# Patient Record
Sex: Male | Born: 1950 | Race: Black or African American | Hispanic: No | Marital: Married | Smoking: Never smoker
Health system: Southern US, Community
[De-identification: ages and names within clinical notes are randomized; demographics above are authoritative.]

## PROBLEM LIST (undated history)

## (undated) DIAGNOSIS — N390 Urinary tract infection, site not specified: Secondary | ICD-10-CM

## (undated) DIAGNOSIS — I10 Essential (primary) hypertension: Secondary | ICD-10-CM

## (undated) DIAGNOSIS — D469 Myelodysplastic syndrome, unspecified: Secondary | ICD-10-CM

## (undated) DIAGNOSIS — C801 Malignant (primary) neoplasm, unspecified: Secondary | ICD-10-CM

## (undated) DIAGNOSIS — Z86711 Personal history of pulmonary embolism: Secondary | ICD-10-CM

## (undated) DIAGNOSIS — I480 Paroxysmal atrial fibrillation: Secondary | ICD-10-CM

## (undated) HISTORY — PX: NO PAST SURGERIES: SHX2092

---

## 2019-07-06 ENCOUNTER — Other Ambulatory Visit: Payer: Self-pay | Admitting: Urology

## 2019-07-06 ENCOUNTER — Other Ambulatory Visit: Payer: Self-pay | Admitting: Physical Therapy

## 2019-07-06 ENCOUNTER — Other Ambulatory Visit (HOSPITAL_COMMUNITY): Payer: Self-pay | Admitting: Urology

## 2019-07-06 DIAGNOSIS — C61 Malignant neoplasm of prostate: Secondary | ICD-10-CM

## 2019-07-23 ENCOUNTER — Ambulatory Visit (HOSPITAL_COMMUNITY)
Admission: RE | Admit: 2019-07-23 | Discharge: 2019-07-23 | Disposition: A | Discharge status: Home / Self Care | Payer: 59 | Source: Ambulatory Visit | Attending: Urology | Admitting: Urology

## 2019-07-23 ENCOUNTER — Other Ambulatory Visit: Payer: Self-pay

## 2019-07-23 ENCOUNTER — Encounter (HOSPITAL_COMMUNITY)
Admission: RE | Admit: 2019-07-23 | Discharge: 2019-07-23 | Disposition: A | Discharge status: Home / Self Care | Payer: 59 | Source: Ambulatory Visit | Attending: Urology | Admitting: Urology

## 2019-07-23 DIAGNOSIS — C61 Malignant neoplasm of prostate: Secondary | ICD-10-CM | POA: Diagnosis not present

## 2019-08-11 ENCOUNTER — Other Ambulatory Visit: Payer: Self-pay | Admitting: Urology

## 2019-08-26 NOTE — Patient Instructions (Addendum)
DUE TO COVID-19 ONLY ONE VISITOR IS ALLOWED TO COME WITH YOU AND STAY IN THE WAITING ROOM ONLY DURING PRE OP AND PROCEDURE DAY OF SURGERY. THE 1 VISITOR MAY VISIT WITH YOU AFTER SURGERY IN YOUR PRIVATE ROOM DURING VISITING HOURS ONLY!  YOU NEED TO HAVE A COVID 19 TEST ON: 08/28/19  @  11:00 am , THIS TEST MUST BE DONE BEFORE SURGERY, COME  [address], [place] , [id number].  ([place]) ONCE YOUR COVID TEST IS COMPLETED, PLEASE BEGIN THE QUARANTINE INSTRUCTIONS AS OUTLINED IN YOUR HANDOUT.                Tyler [name]    Your procedure is scheduled on: 09/01/19   Report to [facility] Main  Entrance   Report to admitting at: 10:00 AM     Call this number if you have problems the morning of surgery [phone]    Remember: Do not eat solid food :After Midnight. Clear liquids from midnight until 9:00 am. Make sure you drink plenty liquids the day of the prep.  CLEAR LIQUID DIET   Foods Allowed                                                                     Foods Excluded  Coffee and tea, regular and decaf                             liquids that you cannot  Plain Jell-O any favor except red or purple                                           see through such as: Fruit ices (not with fruit pulp)                                     milk, soups, orange juice  Iced Popsicles                                    All solid food Carbonated beverages, regular and diet                                    Cranberry, grape and apple juices Sports drinks like Gatorade Lightly seasoned clear broth or consume(fat free) Sugar, honey syrup  Sample Menu Breakfast                                Lunch                                     Supper Cranberry juice                    Beef  broth                            Chicken broth Jell-O                                     Grape juice                           Apple juice Coffee or tea                        Jell-O                                       Popsicle                                                Coffee or tea                        Coffee or tea  _____________________________________________________________________  BRUSH YOUR TEETH MORNING OF SURGERY AND RINSE YOUR MOUTH OUT, NO CHEWING GUM CANDY OR MINTS.     Take these medicines the morning of surgery with A SIP OF WATER: tamsulosin                               You may not have any metal on your body including hair pins and              piercings  Do not wear jewelry, lotions, powders or perfumes, deodorant             Men may shave face and neck.   Do not bring valuables to the hospital. [place].  Contacts, dentures or bridgework may not be worn into surgery.  Leave suitcase in the car. After surgery it may be brought to your room.     Patients discharged the day of surgery will not be allowed to drive home. IF YOU ARE HAVING SURGERY AND GOING HOME THE SAME DAY, YOU MUST HAVE AN ADULT TO DRIVE YOU HOME AND BE WITH YOU FOR 24 HOURS. YOU MAY GO HOME BY TAXI OR UBER OR ORTHERWISE, BUT AN ADULT MUST ACCOMPANY YOU HOME AND STAY WITH YOU FOR 24 HOURS.  Name and phone number of your driver:  Special Instructions: N/A              Please read over the following fact sheets you were given: _____________________________________________________________________  [facility] - Preparing for Surgery Before surgery, you can play an important role.  Because skin is not sterile, your skin needs to be as free of germs as possible.  You can reduce the number of germs on your skin by washing with CHG (chlorahexidine gluconate) soap before surgery.  CHG is an antiseptic cleaner which kills germs and bonds with the skin to continue killing germs even after washing. Please DO NOT use if you  have an allergy to CHG or antibacterial soaps.  If your skin becomes reddened/irritated stop using the CHG and inform your  nurse when you arrive at Short Stay. Do not shave (including legs and underarms) for at least 48 hours prior to the first CHG shower.  You may shave your face/neck. Please follow these instructions carefully:  1.  Shower with CHG Soap the night before surgery and the  morning of Surgery.  2.  If you choose to wash your hair, wash your hair first as usual with your  normal  shampoo.  3.  After you shampoo, rinse your hair and body thoroughly to remove the  shampoo.                           4.  Use CHG as you would any other liquid soap.  You can apply chg directly  to the skin and wash                       Gently with a scrungie or clean washcloth.  5.  Apply the CHG Soap to your body ONLY FROM THE NECK DOWN.   Do not use on face/ open                           Wound or open sores. Avoid contact with eyes, ears mouth and genitals (private parts).                       Wash face,  Genitals (private parts) with your normal soap.             6.  Wash thoroughly, paying special attention to the area where your surgery  will be performed.  7.  Thoroughly rinse your body with warm water from the neck down.  8.  DO NOT shower/wash with your normal soap after using and rinsing off  the CHG Soap.                9.  Pat yourself dry with a clean towel.            10.  Wear clean pajamas.            11.  Place clean sheets on your bed the night of your first shower and do not  sleep with pets. Day of Surgery : Do not apply any lotions/deodorants the morning of surgery.  Please wear clean clothes to the hospital/surgery center.  FAILURE TO FOLLOW THESE INSTRUCTIONS MAY RESULT IN THE CANCELLATION OF YOUR SURGERY PATIENT SIGNATURE_________________________________  NURSE SIGNATURE__________________________________  ________________________________________________________________________

## 2019-08-27 ENCOUNTER — Encounter (HOSPITAL_COMMUNITY)
Admission: RE | Admit: 2019-08-27 | Discharge: 2019-08-27 | Disposition: A | Discharge status: Home / Self Care | Payer: 59 | Source: Ambulatory Visit | Attending: Urology | Admitting: Urology

## 2019-08-27 ENCOUNTER — Encounter (HOSPITAL_COMMUNITY): Payer: Self-pay

## 2019-08-27 ENCOUNTER — Other Ambulatory Visit: Payer: Self-pay

## 2019-08-27 DIAGNOSIS — Z01818 Encounter for other preprocedural examination: Secondary | ICD-10-CM | POA: Insufficient documentation

## 2019-08-27 HISTORY — DX: Essential (primary) hypertension: I10

## 2019-08-27 HISTORY — DX: Malignant (primary) neoplasm, unspecified: C80.1

## 2019-08-27 LAB — CBC
HCT: 43.8 % (ref 39.0–52.0)
Hemoglobin: 13.9 g/dL (ref 13.0–17.0)
MCH: 27.4 pg (ref 26.0–34.0)
MCHC: 31.7 g/dL (ref 30.0–36.0)
MCV: 86.2 fL (ref 80.0–100.0)
Platelets: 264 10*3/uL (ref 150–400)
RBC: 5.08 MIL/uL (ref 4.22–5.81)
RDW: 13.8 % (ref 11.5–15.5)
WBC: 6.2 10*3/uL (ref 4.0–10.5)
nRBC: 0 % (ref 0.0–0.2)

## 2019-08-27 LAB — BASIC METABOLIC PANEL
Anion gap: 10 (ref 5–15)
BUN: 12 mg/dL (ref 8–23)
CO2: 23 mmol/L (ref 22–32)
Calcium: 8.4 mg/dL — ABNORMAL LOW (ref 8.9–10.3)
Chloride: 105 mmol/L (ref 98–111)
Creatinine, Ser: 0.99 mg/dL (ref 0.61–1.24)
GFR calc Af Amer: >60 mL/min (ref >60)
GFR calc non Af Amer: >60 mL/min (ref >60)
Glucose, Bld: 114 mg/dL — ABNORMAL HIGH (ref 70–99)
Potassium: 3.6 mmol/L (ref 3.5–5.1)
Sodium: 138 mmol/L (ref 135–145)

## 2019-08-27 NOTE — Progress Notes (Signed)
COVID Vaccine Completed: Date COVID Vaccine completed: COVID vaccine manufacturer: Pfizer    [employer]   PCP - Dr. Glean Salvo Cardiologist - No  Chest x-ray -  EKG - 02/25/19. CEW Stress Test -  ECHO -  Cardiac Cath -   Sleep Study -  CPAP -   Fasting Blood Sugar -  Checks Blood Sugar _____ times a day  Blood Thinner Instructions: Aspirin Instructions: Last Dose:  Anesthesia review:   Patient denies shortness of breath, fever, cough and chest pain at PAT appointment   Patient verbalized understanding of instructions that were given to them at the PAT appointment. Patient was also instructed that they will need to review over the PAT instructions again at home before surgery.

## 2019-08-28 ENCOUNTER — Other Ambulatory Visit (HOSPITAL_COMMUNITY)
Admission: RE | Admit: 2019-08-28 | Discharge: 2019-08-28 | Disposition: A | Discharge status: Home / Self Care | Payer: 59 | Source: Ambulatory Visit | Attending: Urology | Admitting: Urology

## 2019-08-28 DIAGNOSIS — Z01812 Encounter for preprocedural laboratory examination: Secondary | ICD-10-CM | POA: Diagnosis present

## 2019-08-28 DIAGNOSIS — Z20822 Contact with and (suspected) exposure to covid-19: Secondary | ICD-10-CM | POA: Diagnosis not present

## 2019-08-28 LAB — SARS CORONAVIRUS 2 (TAT 6-24 HRS): SARS Coronavirus 2: NEGATIVE

## 2019-08-31 NOTE — Anesthesia Preprocedure Evaluation (Addendum)
Anesthesia Evaluation  Patient identified by MRN, date of birth, ID band Patient awake    Reviewed: Allergy & Precautions, NPO status , Patient's Chart, lab work & pertinent test results  Airway Mallampati: II  TM Distance: >3 FB Neck ROM: Full    Dental no notable dental hx. (+) Teeth Intact, Dental Advisory Given   Pulmonary neg pulmonary ROS,    Pulmonary exam normal breath sounds clear to auscultation       Cardiovascular hypertension, Pt. on medications Normal cardiovascular exam Rhythm:Regular Rate:Normal     Neuro/Psych negative neurological ROS  negative psych ROS   GI/Hepatic negative GI ROS, Neg liver ROS,   Endo/Other  negative endocrine ROS  Renal/GU negative Renal ROS     Musculoskeletal negative musculoskeletal ROS (+)   Abdominal   Peds  Hematology Lab Results      Component                Value               Date                      WBC                      6.2                 08/27/2019                HGB                      13.9                08/27/2019                HCT                      43.8                08/27/2019                MCV                      86.2                08/27/2019                PLT                      264                 08/27/2019              Anesthesia Other Findings   Reproductive/Obstetrics                            Anesthesia Physical Anesthesia Plan  ASA: II  Anesthesia Plan: General   Post-op Pain Management:    Induction: Intravenous  PONV Risk Score and Plan: 3 and Treatment may vary due to age or medical condition, Ondansetron and Dexamethasone  Airway Management Planned: Oral ETT  Additional Equipment: None  Intra-op Plan:   Post-operative Plan: Extubation in OR  Informed Consent: I have reviewed the patients History and Physical, chart, labs and discussed the procedure including the risks, benefits and  alternatives for the proposed anesthesia with the patient or authorized representative who  has indicated his/her understanding and acceptance.     Dental advisory given  Plan Discussed with:   Anesthesia Plan Comments:        Anesthesia Quick Evaluation

## 2019-09-01 ENCOUNTER — Ambulatory Visit (HOSPITAL_COMMUNITY): Payer: 59 | Admitting: Anesthesiology

## 2019-09-01 ENCOUNTER — Inpatient Hospital Stay (HOSPITAL_COMMUNITY)
Admission: AD | Admit: 2019-09-01 | Discharge: 2019-09-02 | DRG: 708 | Disposition: A | Discharge status: Home / Self Care | Payer: 59 | Attending: Urology | Admitting: Urology

## 2019-09-01 ENCOUNTER — Other Ambulatory Visit: Payer: Self-pay

## 2019-09-01 ENCOUNTER — Encounter (HOSPITAL_COMMUNITY)
Admission: AD | Disposition: A | Discharge status: Home / Self Care | Payer: Self-pay | Source: Home / Self Care | Attending: Urology

## 2019-09-01 ENCOUNTER — Encounter (HOSPITAL_COMMUNITY): Payer: Self-pay | Admitting: Urology

## 2019-09-01 DIAGNOSIS — Z888 Allergy status to other drugs, medicaments and biological substances status: Secondary | ICD-10-CM

## 2019-09-01 DIAGNOSIS — I10 Essential (primary) hypertension: Secondary | ICD-10-CM | POA: Diagnosis present

## 2019-09-01 DIAGNOSIS — N139 Obstructive and reflux uropathy, unspecified: Secondary | ICD-10-CM | POA: Diagnosis present

## 2019-09-01 DIAGNOSIS — C61 Malignant neoplasm of prostate: Secondary | ICD-10-CM | POA: Diagnosis present

## 2019-09-01 DIAGNOSIS — E882 Lipomatosis, not elsewhere classified: Secondary | ICD-10-CM | POA: Diagnosis present

## 2019-09-01 HISTORY — PX: LYMPHADENECTOMY: SHX5960

## 2019-09-01 HISTORY — PX: ROBOT ASSISTED LAPAROSCOPIC RADICAL PROSTATECTOMY: SHX5141

## 2019-09-01 LAB — TYPE AND SCREEN
ABO/RH(D): O POS
Antibody Screen: NEGATIVE

## 2019-09-01 LAB — ABO/RH: ABO/RH(D): O POS

## 2019-09-01 LAB — HEMOGLOBIN AND HEMATOCRIT, BLOOD
HCT: 43.9 % (ref 39.0–52.0)
Hemoglobin: 13.7 g/dL (ref 13.0–17.0)

## 2019-09-01 SURGERY — PROSTATECTOMY, RADICAL, ROBOT-ASSISTED, LAPAROSCOPIC
Anesthesia: General

## 2019-09-01 MED ORDER — SODIUM CHLORIDE 0.9 % IV BOLUS
1000.0000 mL | Freq: Once | INTRAVENOUS | Status: AC
  Administered 2019-09-01: 1000 mL via INTRAVENOUS

## 2019-09-01 MED ORDER — ONDANSETRON HCL 4 MG/2ML IJ SOLN
INTRAMUSCULAR | Status: DC | PRN
  Administered 2019-09-01: 4 mg via INTRAVENOUS

## 2019-09-01 MED ORDER — BELLADONNA ALKALOIDS-OPIUM 16.2-60 MG RE SUPP: 1.0000 | Freq: Four times a day (QID) | RECTAL | Status: DC | PRN

## 2019-09-01 MED ORDER — SUGAMMADEX SODIUM 200 MG/2ML IV SOLN
INTRAVENOUS | Status: DC | PRN
  Administered 2019-09-01: 200 mg via INTRAVENOUS

## 2019-09-01 MED ORDER — DOCUSATE SODIUM 100 MG PO CAPS
100.0000 mg | ORAL_CAPSULE | Freq: Two times a day (BID) | ORAL | Status: DC
  Administered 2019-09-01 – 2019-09-02 (×2): 100 mg via ORAL
  Filled 2019-09-01 (×2): qty 1

## 2019-09-01 MED ORDER — GLYCOPYRROLATE PF 0.2 MG/ML IJ SOSY
PREFILLED_SYRINGE | INTRAMUSCULAR | Status: DC | PRN
Start: 2019-09-01 — End: 2019-09-01
  Administered 2019-09-01: .2 mg via INTRAVENOUS

## 2019-09-01 MED ORDER — MIDAZOLAM HCL 2 MG/2ML IJ SOLN
INTRAMUSCULAR | Status: AC
  Filled 2019-09-01: qty 2

## 2019-09-01 MED ORDER — CHLORHEXIDINE GLUCONATE 0.12 % MT SOLN
15.0000 mL | Freq: Once | OROMUCOSAL | Status: AC
  Administered 2019-09-01: 15 mL via OROMUCOSAL

## 2019-09-01 MED ORDER — ACETAMINOPHEN 500 MG PO TABS
1000.0000 mg | ORAL_TABLET | Freq: Three times a day (TID) | ORAL | Status: DC
  Administered 2019-09-01 – 2019-09-02 (×3): 1000 mg via ORAL
  Filled 2019-09-01 (×3): qty 2

## 2019-09-01 MED ORDER — DEXAMETHASONE SODIUM PHOSPHATE 10 MG/ML IJ SOLN
INTRAMUSCULAR | Status: DC | PRN
  Administered 2019-09-01: 10 mg via INTRAVENOUS

## 2019-09-01 MED ORDER — PROPOFOL 10 MG/ML IV BOLUS
INTRAVENOUS | Status: DC | PRN
  Administered 2019-09-01: 130 mg via INTRAVENOUS

## 2019-09-01 MED ORDER — KCL IN DEXTROSE-NACL 20-5-0.45 MEQ/L-%-% IV SOLN
INTRAVENOUS | Status: DC
  Filled 2019-09-01 (×2): qty 1000

## 2019-09-01 MED ORDER — STERILE WATER FOR IRRIGATION IR SOLN
Status: DC | PRN
  Administered 2019-09-01: 1000 mL

## 2019-09-01 MED ORDER — DEXAMETHASONE SODIUM PHOSPHATE 10 MG/ML IJ SOLN
INTRAMUSCULAR | Status: AC
  Filled 2019-09-01: qty 1

## 2019-09-01 MED ORDER — SODIUM CHLORIDE (PF) 0.9 % IJ SOLN
INTRAMUSCULAR | Status: DC | PRN
  Administered 2019-09-01: 20 mL

## 2019-09-01 MED ORDER — CEFAZOLIN SODIUM-DEXTROSE 2-4 GM/100ML-% IV SOLN
2.0000 g | INTRAVENOUS | Status: AC
  Administered 2019-09-01: 2 g via INTRAVENOUS
  Filled 2019-09-01: qty 100

## 2019-09-01 MED ORDER — LACTATED RINGERS IR SOLN
Status: DC | PRN
  Administered 2019-09-01: 1000 mL

## 2019-09-01 MED ORDER — FENTANYL CITRATE (PF) 250 MCG/5ML IJ SOLN
INTRAMUSCULAR | Status: AC
  Filled 2019-09-01: qty 5

## 2019-09-01 MED ORDER — EPHEDRINE SULFATE-NACL 50-0.9 MG/10ML-% IV SOSY
PREFILLED_SYRINGE | INTRAVENOUS | Status: DC | PRN
  Administered 2019-09-01: 5 mg via INTRAVENOUS

## 2019-09-01 MED ORDER — OXYCODONE HCL 5 MG PO TABS
5.0000 mg | ORAL_TABLET | ORAL | Status: DC | PRN
  Administered 2019-09-01 – 2019-09-02 (×2): 5 mg via ORAL
  Filled 2019-09-01 (×3): qty 1

## 2019-09-01 MED ORDER — SODIUM CHLORIDE (PF) 0.9 % IJ SOLN
INTRAMUSCULAR | Status: AC
  Filled 2019-09-01: qty 20

## 2019-09-01 MED ORDER — ONDANSETRON HCL 4 MG/2ML IJ SOLN
INTRAMUSCULAR | Status: AC
  Filled 2019-09-01: qty 2

## 2019-09-01 MED ORDER — MAGNESIUM CITRATE PO SOLN: 1.0000 | Freq: Once | ORAL | Status: DC

## 2019-09-01 MED ORDER — MIDAZOLAM HCL 5 MG/5ML IJ SOLN
INTRAMUSCULAR | Status: DC | PRN
  Administered 2019-09-01: 2 mg via INTRAVENOUS

## 2019-09-01 MED ORDER — ORAL CARE MOUTH RINSE: 15.0000 mL | Freq: Once | OROMUCOSAL | Status: AC

## 2019-09-01 MED ORDER — ONDANSETRON HCL 4 MG/2ML IJ SOLN: 4.0000 mg | Freq: Once | INTRAMUSCULAR | Status: DC | PRN

## 2019-09-01 MED ORDER — FENTANYL CITRATE (PF) 100 MCG/2ML IJ SOLN
25.0000 ug | INTRAMUSCULAR | Status: DC | PRN
  Administered 2019-09-01: 50 ug via INTRAVENOUS

## 2019-09-01 MED ORDER — LACTATED RINGERS IV SOLN: INTRAVENOUS | Status: DC

## 2019-09-01 MED ORDER — DIPHENHYDRAMINE HCL 50 MG/ML IJ SOLN: 12.5000 mg | Freq: Four times a day (QID) | INTRAMUSCULAR | Status: DC | PRN

## 2019-09-01 MED ORDER — BACITRACIN-NEOMYCIN-POLYMYXIN 400-5-5000 EX OINT: 1.0000 "application " | TOPICAL_OINTMENT | Freq: Three times a day (TID) | CUTANEOUS | Status: DC | PRN

## 2019-09-01 MED ORDER — FENTANYL CITRATE (PF) 100 MCG/2ML IJ SOLN
INTRAMUSCULAR | Status: DC | PRN
  Administered 2019-09-01: 75 ug via INTRAVENOUS
  Administered 2019-09-01 (×2): 50 ug via INTRAVENOUS
  Administered 2019-09-01: 25 ug via INTRAVENOUS
  Administered 2019-09-01: 50 ug via INTRAVENOUS
  Administered 2019-09-01: 25 ug via INTRAVENOUS
  Administered 2019-09-01: 50 ug via INTRAVENOUS

## 2019-09-01 MED ORDER — PROPOFOL 10 MG/ML IV BOLUS
INTRAVENOUS | Status: AC
  Filled 2019-09-01: qty 20

## 2019-09-01 MED ORDER — MORPHINE SULFATE (PF) 2 MG/ML IV SOLN
2.0000 mg | INTRAVENOUS | Status: DC | PRN
  Administered 2019-09-01: 2 mg via INTRAVENOUS
  Filled 2019-09-01: qty 1

## 2019-09-01 MED ORDER — FENTANYL CITRATE (PF) 100 MCG/2ML IJ SOLN
INTRAMUSCULAR | Status: AC
  Administered 2019-09-01: 50 ug via INTRAVENOUS
  Filled 2019-09-01: qty 2

## 2019-09-01 MED ORDER — DIPHENHYDRAMINE HCL 12.5 MG/5ML PO ELIX: 12.5000 mg | ORAL_SOLUTION | Freq: Four times a day (QID) | ORAL | Status: DC | PRN

## 2019-09-01 MED ORDER — ONDANSETRON HCL 4 MG/2ML IJ SOLN: 4.0000 mg | INTRAMUSCULAR | Status: DC | PRN

## 2019-09-01 MED ORDER — ACETAMINOPHEN 10 MG/ML IV SOLN: 1000.0000 mg | Freq: Once | INTRAVENOUS | Status: DC | PRN

## 2019-09-01 MED ORDER — ROCURONIUM BROMIDE 10 MG/ML (PF) SYRINGE
PREFILLED_SYRINGE | INTRAVENOUS | Status: AC
  Filled 2019-09-01: qty 10

## 2019-09-01 MED ORDER — LIDOCAINE 2% (20 MG/ML) 5 ML SYRINGE
INTRAMUSCULAR | Status: DC | PRN
  Administered 2019-09-01: 100 mg via INTRAVENOUS

## 2019-09-01 MED ORDER — BUPIVACAINE LIPOSOME 1.3 % IJ SUSP
20.0000 mL | Freq: Once | INTRAMUSCULAR | Status: AC
  Administered 2019-09-01: 20 mL
  Filled 2019-09-01: qty 20

## 2019-09-01 MED ORDER — FENTANYL CITRATE (PF) 100 MCG/2ML IJ SOLN
INTRAMUSCULAR | Status: AC
  Filled 2019-09-01: qty 2

## 2019-09-01 MED ORDER — ROCURONIUM BROMIDE 10 MG/ML (PF) SYRINGE
PREFILLED_SYRINGE | INTRAVENOUS | Status: DC | PRN
  Administered 2019-09-01: 20 mg via INTRAVENOUS
  Administered 2019-09-01: 60 mg via INTRAVENOUS

## 2019-09-01 MED ORDER — LIDOCAINE 2% (20 MG/ML) 5 ML SYRINGE
INTRAMUSCULAR | Status: AC
  Filled 2019-09-01: qty 5

## 2019-09-01 MED ORDER — CEFAZOLIN SODIUM-DEXTROSE 1-4 GM/50ML-% IV SOLN
1.0000 g | Freq: Three times a day (TID) | INTRAVENOUS | Status: AC
  Administered 2019-09-01 – 2019-09-02 (×2): 1 g via INTRAVENOUS
  Filled 2019-09-01 (×2): qty 50

## 2019-09-01 SURGICAL SUPPLY — 66 items
APPLICATOR COTTON TIP 6 STRL (MISCELLANEOUS) ×2 IMPLANT
APPLICATOR COTTON TIP 6IN STRL (MISCELLANEOUS) ×4
CATH FOLEY 2WAY SLVR 18FR 30CC (CATHETERS) ×4 IMPLANT
CATH TIEMANN FOLEY 18FR 5CC (CATHETERS) ×4 IMPLANT
CHLORAPREP W/TINT 26 (MISCELLANEOUS) ×4 IMPLANT
CLIP VESOLOCK LG 6/CT PURPLE (CLIP) ×8 IMPLANT
CNTNR URN SCR LID CUP LEK RST (MISCELLANEOUS) ×2 IMPLANT
CONT SPEC 4OZ STRL OR WHT (MISCELLANEOUS) ×2
COVER SURGICAL LIGHT HANDLE (MISCELLANEOUS) ×4 IMPLANT
COVER TIP SHEARS 8 DVNC (MISCELLANEOUS) ×2 IMPLANT
COVER TIP SHEARS 8MM DA VINCI (MISCELLANEOUS) ×2
COVER WAND RF STERILE (DRAPES) IMPLANT
CUTTER ECHEON FLEX ENDO 45 340 (ENDOMECHANICALS) ×4 IMPLANT
DECANTER SPIKE VIAL GLASS SM (MISCELLANEOUS) ×4 IMPLANT
DERMABOND ADVANCED (GAUZE/BANDAGES/DRESSINGS) ×2
DERMABOND ADVANCED .7 DNX12 (GAUZE/BANDAGES/DRESSINGS) ×2 IMPLANT
DRAIN CHANNEL RND F F (WOUND CARE) IMPLANT
DRAPE ARM DVNC X/XI (DISPOSABLE) ×8 IMPLANT
DRAPE COLUMN DVNC XI (DISPOSABLE) ×2 IMPLANT
DRAPE DA VINCI XI ARM (DISPOSABLE) ×8
DRAPE DA VINCI XI COLUMN (DISPOSABLE) ×2
DRAPE SURG IRRIG POUCH 19X23 (DRAPES) ×4 IMPLANT
DRSG TEGADERM 4X4.75 (GAUZE/BANDAGES/DRESSINGS) ×4 IMPLANT
ELECT REM PT RETURN 15FT ADLT (MISCELLANEOUS) ×4 IMPLANT
GAUZE 4X4 16PLY RFD (DISPOSABLE) IMPLANT
GAUZE SPONGE 2X2 8PLY STRL LF (GAUZE/BANDAGES/DRESSINGS) IMPLANT
GLOVE BIO SURGEON STRL SZ 6.5 (GLOVE) ×3 IMPLANT
GLOVE BIO SURGEONS STRL SZ 6.5 (GLOVE) ×1
GLOVE BIOGEL M STRL SZ7.5 (GLOVE) ×8 IMPLANT
GLOVE BIOGEL PI IND STRL 7.5 (GLOVE) ×2 IMPLANT
GLOVE BIOGEL PI INDICATOR 7.5 (GLOVE) ×2
GOWN STRL REUS W/TWL LRG LVL3 (GOWN DISPOSABLE) ×12 IMPLANT
HOLDER FOLEY CATH W/STRAP (MISCELLANEOUS) ×4 IMPLANT
IRRIG SUCT STRYKERFLOW 2 WTIP (MISCELLANEOUS) ×4
IRRIGATION SUCT STRKRFLW 2 WTP (MISCELLANEOUS) ×2 IMPLANT
IV LACTATED RINGERS 1000ML (IV SOLUTION) ×4 IMPLANT
KIT PROCEDURE DA VINCI SI (MISCELLANEOUS) ×2
KIT PROCEDURE DVNC SI (MISCELLANEOUS) ×2 IMPLANT
KIT TURNOVER KIT A (KITS) IMPLANT
NEEDLE INSUFFLATION 14GA 120MM (NEEDLE) ×4 IMPLANT
NEEDLE SPNL 22GX7 QUINCKE BK (NEEDLE) ×4 IMPLANT
PACK ROBOTIC CUSTOM UROLOGY (CUSTOM PROCEDURE TRAY) ×4 IMPLANT
PAD POSITIONING PINK XL (MISCELLANEOUS) ×4 IMPLANT
PENCIL SMOKE EVACUATOR (MISCELLANEOUS) IMPLANT
PORT ACCESS TROCAR AIRSEAL 12 (TROCAR) ×2 IMPLANT
PORT ACCESS TROCAR AIRSEAL 5M (TROCAR) ×2
SEAL CANN UNIV 5-8 DVNC XI (MISCELLANEOUS) ×8 IMPLANT
SEAL XI 5MM-8MM UNIVERSAL (MISCELLANEOUS) ×8
SET TRI-LUMEN FLTR TB AIRSEAL (TUBING) ×4 IMPLANT
SOLUTION ELECTROLUBE (MISCELLANEOUS) ×4 IMPLANT
SPONGE GAUZE 2X2 STER 10/PKG (GAUZE/BANDAGES/DRESSINGS)
SPONGE LAP 4X18 RFD (DISPOSABLE) ×4 IMPLANT
STAPLE RELOAD 45 GRN (STAPLE) ×2 IMPLANT
STAPLE RELOAD 45MM GREEN (STAPLE) ×2
SUT ETHILON 3 0 PS 1 (SUTURE) ×4 IMPLANT
SUT MNCRL AB 4-0 PS2 18 (SUTURE) ×8 IMPLANT
SUT PDS AB 1 CT1 27 (SUTURE) ×8 IMPLANT
SUT VIC AB 2-0 SH 27 (SUTURE) ×2
SUT VIC AB 2-0 SH 27X BRD (SUTURE) ×2 IMPLANT
SUT VICRYL 0 UR6 27IN ABS (SUTURE) ×4 IMPLANT
SUT VLOC BARB 180 ABS3/0GR12 (SUTURE) ×12
SUTURE VLOC BRB 180 ABS3/0GR12 (SUTURE) ×6 IMPLANT
SYR 27GX1/2 1ML LL SAFETY (SYRINGE) ×4 IMPLANT
TOWEL OR NON WOVEN STRL DISP B (DISPOSABLE) ×4 IMPLANT
TROCAR XCEL NON-BLD 5MMX100MML (ENDOMECHANICALS) IMPLANT
WATER STERILE IRR 1000ML POUR (IV SOLUTION) ×4 IMPLANT

## 2019-09-01 NOTE — H&P (Signed)
Tyler [name] is an 69 y.o. male.    Chief Complaint: Pre-OP Prostatectomy  HPI:   1 - Moderate Risk Prostate Cancer - 4 cores up to 60% Grade 3 disease by BX 10/2018 on eval PSA 8.5. TRUS 35mL, no median. Initial DX by Dr. [place] Crane. Had rad-onc opinion as well. [facility] predicts 42% confined, 10% nodal involvement. CT, BS 06/2019 localized and PSA 6.42.    PMH sig for HTN. No CV disease / blood thinners. 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 His PCP is Glean Salvo NP, with [facility]   Today " Edu" is seen to proceed with prostatectomy. No interval fevers.   Past Medical History:  Diagnosis Date  . Cancer [facility])    prostate  . Hypertension     Past Surgical History:  Procedure Laterality Date  . NO PAST SURGERIES      No family history on file. Social History:  reports that he has never smoked. He has never used smokeless tobacco. He reports previous alcohol use. He reports that he does not use drugs.  Allergies:  Allergies  Allergen Reactions  . Amlodipine Swelling    BILATERAL FEET TO ANKLES    No medications prior to admission.    No results found for this or any previous visit (from the past 48 hour(s)). No results found.  Review of Systems  Constitutional: Negative for chills and fever.  All other systems reviewed and are negative.   There were no vitals taken for this visit. Physical Exam Vitals reviewed.  HENT:     Head: Normocephalic.     Nose: Nose normal.  Cardiovascular:     Pulses: Normal pulses.  Pulmonary:     Effort: Pulmonary effort is normal.  Abdominal:     General: Abdomen is flat.  Genitourinary:    Comments: No CVAT Musculoskeletal:        General: Normal range of motion.     Cervical back: Normal range of motion.  Skin:    General: Skin is warm.  Neurological:     General: No focal deficit present.     Mental Status: He is alert.   Psychiatric:        Mood and Affect: Mood normal.      Assessment/Plan  Proceed as planned with prostatectomy with curative intent. Risks, benefits, alternatives, expected peri-op course discussed previously, and reiterated today.   Alexis Frock, MD 09/01/2019, 8:24 AM

## 2019-09-01 NOTE — Brief Op Note (Signed)
09/01/2019  3:31 PM  PATIENT:  Tyler [name]  69 y.o. male  PRE-OPERATIVE DIAGNOSIS:  PROSTATE CANCER  POST-OPERATIVE DIAGNOSIS:  PROSTATE CANCER  PROCEDURE:  Procedure(s) with comments: XI ROBOTIC ASSISTED LAPAROSCOPIC RADICAL PROSTATECTOMY (N/A) - 3 HRS LYMPHADENECTOMY (Bilateral)  SURGEON:  Surgeon(s) and Role:    * Alexis Frock, MD - Primary  PHYSICIAN ASSISTANT:   ASSISTANTS: Carmie Kanner MD   ANESTHESIA:   general  EBL:  200 mL   BLOOD ADMINISTERED:none  DRAINS: 1 - JP to bulb; 2 - Foley to gravity   LOCAL MEDICATIONS USED:  NONE  SPECIMEN:  Source of Specimen:  periprostatic fat; pelvic lymph nodes; prostatectomy  DISPOSITION OF SPECIMEN:  PATHOLOGY  COUNTS:  YES  TOURNIQUET:  * No tourniquets in log *  DICTATION: .Other Dictation: Dictation Number [id number]  PLAN OF CARE: Admit for overnight observation  PATIENT DISPOSITION:  PACU - hemodynamically stable.   Delay start of Pharmacological VTE agent (>24hrs) due to surgical blood loss or risk of bleeding: yes

## 2019-09-01 NOTE — Transfer of Care (Signed)
Immediate Anesthesia Transfer of Care Note  Patient: Tyler [name]  Procedure(s) Performed: XI ROBOTIC ASSISTED LAPAROSCOPIC RADICAL PROSTATECTOMY (N/A ) LYMPHADENECTOMY (Bilateral )  Patient Location: PACU  Anesthesia Type:General  Level of Consciousness: awake and patient cooperative  Airway & Oxygen Therapy: Patient Spontanous Breathing and Patient connected to face mask  Post-op Assessment: Report given to RN and Post -op Vital signs reviewed and stable  Post vital signs: Reviewed and stable  Last Vitals:  Vitals Value Taken Time  BP 156/101 09/01/19 1557  Temp    Pulse 81 09/01/19 1559  Resp 17 09/01/19 1559  SpO2 98 % 09/01/19 1559  Vitals shown include unvalidated device data.  Last Pain:  Vitals:   09/01/19 1048  TempSrc: Oral         Complications: No complications documented.

## 2019-09-01 NOTE — Op Note (Signed)
NAME: ASH, MCELWAIN MEDICAL RECORD TI:[id number] ACCOUNT [id number] DATE OF BIRTH:April 28, 1950 FACILITY: [facility] LOCATION: [facility] PHYSICIAN:Ramone Gander [name], MD  OPERATIVE REPORT  DATE OF PROCEDURE:  09/01/2019  PREOPERATIVE DIAGNOSIS:  Unfavorable moderate risk prostate cancer.  PROCEDURE:   1.  Robotic-assisted laparoscopic radical prostatectomy. 2.  Bilateral pelvic node dissection.  ESTIMATED BLOOD LOSS:  200 mL.  MEDICATIONS:  None.  SPECIMENS: 1.  Periprostatic fat. 2.  Right external iliac lymph nodes. 3.  Right obturator lymph nodes. 4.  Left external iliac lymph nodes. 5.  Left obturator lymph nodes. 6.  Prostatectomy.  SURGEON:  Alexis Frock, MD  ASSISTANT:  Carmie Kanner, MD  FINDINGS: 1.  Large volume pelvic fat, borderline pelvic lipomatosis. 2.  Sentinel lymph nodes, right external iliac group.  INDICATIONS:  The patient is a pleasant 69 year old [place] man who was found on workup of elevated PSA to have multifocal unfavorable intermediate risk prostate cancer.  Staging imaging was unremarkable for locally advanced or distant disease.  He also  has baseline obstructive voiding symptoms.  Options were discussed for management including ablative therapy versus surgical extirpation versus surveillance protocols.  He has elected for surgical extirpation with curative intent.  Informed consent was  obtained and placed in medical record.  Notably, the patient's daughter, [name] is very involved, provides some interpretation and the patient's English is actually quite good.  DESCRIPTION OF PROCEDURE:  The patient being identified, the procedure being radical prostatectomy was confirmed.  Procedure timeout was performed.  Intravenous antibiotics administered.  General endotracheal anesthesia induced.  The patient was placed  into a low lithotomy position.  Sterile field was created, prepping and draping base of the penis, perineum and proximal thighs using iodine and  his infraxiphoid using chlorhexidine gluconate.  His arms were tucked to the side with gel roll.  He was  further fastened to operative table using 3-inch tape over foam padding across the supraxiphoid chest.  A test of steep Trendelenburg positioning was performed and found to be suitably positioned.  Foley catheter was placed free to straight drain.  Next,  a high-flow, low-pressure pneumoperitoneum was obtained using Veress technique in the supraumbilical midline, having passed the aspiration and drop test.  An 8 mm robotic camera port was then placed in same location.  Laparoscopic examination of the  perineum revealed no significant adhesions, no visceral injury.  Additional ports were placed as follows:  Right paramedian 8 mm robotic port, right far lateral 12 mm AirSeal assist port, right paramedian 5 mm suction port, left paramedian 8 mm robotic  port, left far lateral 8 mm robotic port.  Robot was then docked and passed electronic checks.  Inspection of the pelvis did reveal a significant amount of pelvic fat, somewhat out of proportion to the remainder of his intraabdominal fat near borderline  pelvic lipomatosis, but no evidence of any sort of obstructive problems from this.  The incision was made lateral to right medial umbilical ligament from the midline towards the internal ring, coursing along the iliac vessels towards the right ureter.   The right vas deferens was encountered, ligated, placed on gentle medial traction.  Dissection proceeded inferiorly towards the area of the endopelvic fascia on the right side.  A mirror image dissection was performed on the left side.  Anterior  attachments were taken down with cautery scissors to expose the anterior base of the prostate, which was defatted to better denote the bladder neck-prostate junction.  Next, 0.2 mL of indocyanine green dye was injected  into each lobe of the prostate  using a percutaneously placed robotically placed spinal needle with  intervening suctioning to prevent dye spillage.  The endopelvic fascia was swept away from the lateral aspect of the prostate in a base to apex orientation.  This exposed the dorsal  venous complex.  It was controlled using a green load stapler, which resulted in excellent hemostasis.  Next, lymphadenectomy was performed on the right side, first in the right external iliac group with the boundaries being right external iliac artery,  vein, pelvic sidewall, iliac bifurcation.  Lymphostasis was achieved with cold clips, set aside labeled right external iliac lymph nodes.  Next, the right obturator group was dissected free with the boundaries being right external iliac vein, pelvic  sidewall and obturator nerve.  Lymphostasis was achieved with cold clips, set aside labeled right obturator lymph nodes.  A mirror image dissection was performed on the left side, left external iliac, left obturator group respectively.  Bilateral  obturator nerve was inspected following maneuvers and found to be uninjured.  Attention was then directed at bladder neck dissection.  A lateral release was performed on each side to better denote bladder neck caliber.  The bladder neck was dissected  away from the base of the prostate in anterior, posterior direction, keeping what appeared to be a rim of circular muscle fibers with each plane of dissection.  Posterior dissection was performed by incising approximately 7 mm inferoposterior to the  posterior lip of the prostate and entering the plane of Denonvilliers.  Bilateral seminal vesicles, vas deferens were encountered.  The vas were dissected for a distance of approximately 4 cm, ligated and placed on gentle superior traction.  Bilateral  seminal vesicles were dissected to their tips and placed on gentle superior traction.  Dissection continued posteriorly towards the area of the apex of the prostate.  This exposed the vascular pedicles on each side.  These were controlled using the   sequential bipolar technique as again his significant amount of pelvic fat precluded sufficient visualization, retraction for clipping.  This resulted in excellent hemostatic control of the pedicles bilaterally.  Final apical dissection was performed in  the anterior plane, placing the prostate on gentle superior traction, transecting the membranous urethra coldly.  This completely freed the prostate.  Specimen was placed in EndoCatch bag for later retrieval.  Next, digital rectal exam was performed  using indicator glove under laparoscopic vision.  No evidence of rectal violation was noted.  Posterior reconstruction was then performed using 3-0 V-Loc suture, reapproximating the posterior urethral plate to the posterior bladder neck, bringing these  structures into tension tension-free apposition.  Next mucosa-to-mucosa anastomosis was performed using double-armed 3-0 V-Loc suture from the 6 o'clock to the 12 o'clock position, which resulted in excellent apposition.  A new Foley catheter was placed  per urethra, which irrigated quantitatively.  Sponge and needle counts were correct.  Hemostasis was excellent.  A closed suction drain was brought through  the previous left lateral most robotic port site into the peritoneal cavity.  Robot was then  undocked.  The previous right 12 mm assistant port site was closed at the level of the fascia using Carter-Thomason suture passer and 0 Vicryl.  Specimen was retrieved by extending the previous camera port site superiorly for a distance of approximately  3 cm, removing the prostatectomy specimen and setting it aside for permanent pathology.  The site was closed at the level of the fascia using figure-of-eight PDS x3, followed by  reapproximation of Scarpa's with a running Vicryl.  All incision sites were  infiltrated with dilute lipolyzed Marcaine and closed at the level of the skin using subcuticular Monocryl, followed by Dermabond.  The procedure was then  terminated.  The patient tolerated the procedure well.  No immediate perioperative complications.   The patient was taken to the  postanesthesia care unit in stable condition with plan for observation admission.  Please note, first assistant Carmie Kanner was crucial for all portions of surgery today.  She provided invaluable retraction, suctioning, vascular clipping, vascular stapling and general first assistance.  VN/NUANCE  D:09/01/2019 T:09/01/2019 JOB:[id number]

## 2019-09-01 NOTE — Anesthesia Procedure Notes (Signed)
Procedure Name: Intubation Date/Time: 09/01/2019 1:50 PM Performed by: Maxwell Caul, CRNA Pre-anesthesia Checklist: Patient identified, Emergency Drugs available, Suction available and Patient being monitored Patient Re-evaluated:Patient Re-evaluated prior to induction Oxygen Delivery Method: Circle system utilized Preoxygenation: Pre-oxygenation with 100% oxygen Induction Type: IV induction Ventilation: Mask ventilation without difficulty Laryngoscope Size: Mac and 4 Grade View: Grade I Tube type: Oral Tube size: 7.5 mm Number of attempts: 1 Airway Equipment and Method: Stylet Placement Confirmation: ETT inserted through vocal cords under direct vision,  positive ETCO2 and breath sounds checked- equal and bilateral Secured at: 21 cm Tube secured with: Tape Dental Injury: Teeth and Oropharynx as per pre-operative assessment

## 2019-09-01 NOTE — Anesthesia Postprocedure Evaluation (Signed)
Anesthesia Post Note  Patient: Tyler [name]  Procedure(s) Performed: XI ROBOTIC ASSISTED LAPAROSCOPIC RADICAL PROSTATECTOMY (N/A ) LYMPHADENECTOMY (Bilateral )     Patient location during evaluation: PACU Anesthesia Type: General Level of consciousness: awake and alert Pain management: pain level controlled Vital Signs Assessment: post-procedure vital signs reviewed and stable Respiratory status: spontaneous breathing, nonlabored ventilation, respiratory function stable and patient connected to nasal cannula oxygen Cardiovascular status: blood pressure returned to baseline and stable Postop Assessment: no apparent nausea or vomiting Anesthetic complications: no   No complications documented.  Last Vitals:  Vitals:   09/01/19 1700 09/01/19 1731  BP: (!) 140/99 129/84  Pulse: 76 65  Resp: 13 17  Temp: 36.9 C 36.4 C  SpO2: 95% 99%    Last Pain:  Vitals:   09/01/19 1731  TempSrc: Oral  PainSc:                  Barnet Glasgow

## 2019-09-02 ENCOUNTER — Encounter (HOSPITAL_COMMUNITY): Payer: Self-pay | Admitting: Urology

## 2019-09-02 LAB — BASIC METABOLIC PANEL
Anion gap: 7 (ref 5–15)
BUN: 13 mg/dL (ref 8–23)
CO2: 22 mmol/L (ref 22–32)
Calcium: 8.2 mg/dL — ABNORMAL LOW (ref 8.9–10.3)
Chloride: 109 mmol/L (ref 98–111)
Creatinine, Ser: 1.11 mg/dL (ref 0.61–1.24)
GFR calc Af Amer: >60 mL/min (ref >60)
GFR calc non Af Amer: >60 mL/min (ref >60)
Glucose, Bld: 136 mg/dL — ABNORMAL HIGH (ref 70–99)
Potassium: 4.8 mmol/L (ref 3.5–5.1)
Sodium: 138 mmol/L (ref 135–145)

## 2019-09-02 LAB — HEMOGLOBIN AND HEMATOCRIT, BLOOD
HCT: 36.1 % — ABNORMAL LOW (ref 39.0–52.0)
Hemoglobin: 11.6 g/dL — ABNORMAL LOW (ref 13.0–17.0)

## 2019-09-02 MED ORDER — BELLADONNA ALKALOIDS-OPIUM 16.2-60 MG RE SUPP
1.0000 | Freq: Once | RECTAL | Status: AC
  Administered 2019-09-02: 1 via RECTAL
  Filled 2019-09-02: qty 1

## 2019-09-02 MED ORDER — CHLORHEXIDINE GLUCONATE CLOTH 2 % EX PADS
6.0000 | MEDICATED_PAD | Freq: Every day | CUTANEOUS | Status: DC
  Administered 2019-09-02: 6 via TOPICAL

## 2019-09-02 MED ORDER — OXYCODONE-ACETAMINOPHEN 5-325 MG PO TABS: 1.0000 | ORAL_TABLET | Freq: Four times a day (QID) | ORAL | 0 refills | Status: AC | PRN

## 2019-09-02 MED ORDER — SULFAMETHOXAZOLE-TRIMETHOPRIM 800-160 MG PO TABS
1.0000 | ORAL_TABLET | Freq: Two times a day (BID) | ORAL | 0 refills | Status: DC
Start: 2019-09-02 — End: 2019-09-11

## 2019-09-02 MED ORDER — SENNOSIDES-DOCUSATE SODIUM 8.6-50 MG PO TABS
1.0000 | ORAL_TABLET | Freq: Two times a day (BID) | ORAL | 0 refills | Status: DC
Start: 2019-09-02 — End: 2021-11-18

## 2019-09-02 NOTE — Progress Notes (Signed)
Foley care demonstrated to patient and daughter. Supplies given for Foley and JP site as well. Verbalizes understanding.Eulas Post, RN

## 2019-09-02 NOTE — Discharge Summary (Signed)
Physician Discharge Summary  Patient ID: Tyler [name] MRN: [id number] DOB/AGE: 04-25-1950 69 y.o.  Admit date: 09/01/2019 Discharge date: 09/02/2019  Admission Diagnoses: Prostate Cancer  Discharge Diagnoses:  Active Problems:   Prostate cancer [facility])   Discharged Condition: good  Hospital Course: Pt underwent robotic prostatectomy with node dissection on 09/01/19, the day of admission, without acute complication. He was observed overnight in the 4th floor Urology unit. By the afternoon of POD 1 he is ambulatory, tollerating PO intake, pain controlled on PO meds and felt to be adequate for discharge. Cr 1.1 (baseline), Hgb 11.6, path pending at discharge. JP removed as output scant.    Consults: None  Significant Diagnostic Studies: labs: as per above  Treatments: surgery: as per above  Discharge Exam: Blood pressure 116/81, pulse (!) 57, temperature 98.3 F (36.8 C), temperature source Oral, resp. rate 20, height 5\' 7"  (1.702 m), weight 86.3 kg, SpO2 96 %. General appearance: alert, cooperative, appears stated age and family at bedside Eyes: negative Nose: Nares normal. Septum midline. Mucosa normal. No drainage or sinus tenderness. Throat: lips, mucosa, and tongue normal; teeth and gums normal Neck: supple, symmetrical, trachea midline Back: symmetric, no curvature. ROM normal. No CVA tenderness. Resp: non-labored on room air Cardio: Nl rate GI: soft, non-tender; bowel sounds normal; no masses,  no organomegaly and recent surgical sites c/d/i. JP with scant serosanguinous non-foul output. Removed and dry dressing placed, inspected and intact.  Male genitalia: normal, Foley in place wtih medium yellow urine. Soft penile edema.  Extremities: extremities normal, atraumatic, no cyanosis or edema Pulses: 2+ and symmetric Skin: Skin color, texture, turgor normal. No rashes or lesions Lymph nodes: Cervical, supraclavicular, and axillary nodes normal. Neurologic: Grossly  normal  Disposition:    Allergies as of 09/02/2019      Reactions   Amlodipine Swelling   BILATERAL FEET TO ANKLES      Medication List    STOP taking these medications   tamsulosin 0.4 MG Caps capsule Commonly known as: FLOMAX     TAKE these medications   lisinopril 5 MG tablet Commonly known as: ZESTRIL Take 5 mg by mouth daily.   oxyCODONE-acetaminophen 5-325 MG tablet Commonly known as: Percocet Take 1 tablet by mouth every 6 (six) hours as needed for moderate pain or severe pain. Post-operatively.   senna-docusate 8.6-50 MG tablet Commonly known as: Senokot-S Take 1 tablet by mouth 2 (two) times daily. While taking strong pain meds to prevent constipation.   sulfamethoxazole-trimethoprim 800-160 MG tablet Commonly known as: BACTRIM DS Take 1 tablet by mouth 2 (two) times daily. X 3 days. Begin day before next Urology appointment.       Follow-up Information    Alexis Frock, MD On 09/13/2019.   Specialty: Urology Why: at 10 AM for MD visit and office catheter removal.  Contact information: [address] [place] [address] [phone]               Signed: Alexis Frock 09/02/2019, 3:01 PM

## 2019-09-05 ENCOUNTER — Emergency Department (HOSPITAL_COMMUNITY): Payer: 59

## 2019-09-05 ENCOUNTER — Encounter (HOSPITAL_COMMUNITY): Payer: Self-pay

## 2019-09-05 ENCOUNTER — Other Ambulatory Visit: Payer: Self-pay

## 2019-09-05 ENCOUNTER — Inpatient Hospital Stay (HOSPITAL_COMMUNITY)
Admission: EM | Admit: 2019-09-05 | Discharge: 2019-09-11 | DRG: 176 | Disposition: A | Discharge status: Home Health | Payer: 59 | Attending: Internal Medicine | Admitting: Internal Medicine

## 2019-09-05 DIAGNOSIS — N501 Vascular disorders of male genital organs: Secondary | ICD-10-CM

## 2019-09-05 DIAGNOSIS — I110 Hypertensive heart disease with heart failure: Secondary | ICD-10-CM | POA: Diagnosis present

## 2019-09-05 DIAGNOSIS — I10 Essential (primary) hypertension: Secondary | ICD-10-CM

## 2019-09-05 DIAGNOSIS — I2694 Multiple subsegmental pulmonary emboli without acute cor pulmonale: Secondary | ICD-10-CM | POA: Diagnosis not present

## 2019-09-05 DIAGNOSIS — Z86711 Personal history of pulmonary embolism: Secondary | ICD-10-CM

## 2019-09-05 DIAGNOSIS — I480 Paroxysmal atrial fibrillation: Secondary | ICD-10-CM

## 2019-09-05 DIAGNOSIS — I2699 Other pulmonary embolism without acute cor pulmonale: Secondary | ICD-10-CM | POA: Diagnosis present

## 2019-09-05 DIAGNOSIS — D62 Acute posthemorrhagic anemia: Secondary | ICD-10-CM | POA: Diagnosis present

## 2019-09-05 DIAGNOSIS — E876 Hypokalemia: Secondary | ICD-10-CM | POA: Diagnosis present

## 2019-09-05 DIAGNOSIS — Z20822 Contact with and (suspected) exposure to covid-19: Secondary | ICD-10-CM | POA: Diagnosis present

## 2019-09-05 DIAGNOSIS — N3289 Other specified disorders of bladder: Secondary | ICD-10-CM | POA: Diagnosis present

## 2019-09-05 DIAGNOSIS — R0600 Dyspnea, unspecified: Secondary | ICD-10-CM

## 2019-09-05 DIAGNOSIS — R319 Hematuria, unspecified: Secondary | ICD-10-CM | POA: Diagnosis present

## 2019-09-05 DIAGNOSIS — C61 Malignant neoplasm of prostate: Secondary | ICD-10-CM | POA: Diagnosis present

## 2019-09-05 DIAGNOSIS — Z9079 Acquired absence of other genital organ(s): Secondary | ICD-10-CM

## 2019-09-05 DIAGNOSIS — D72829 Elevated white blood cell count, unspecified: Secondary | ICD-10-CM | POA: Diagnosis present

## 2019-09-05 DIAGNOSIS — I959 Hypotension, unspecified: Secondary | ICD-10-CM | POA: Diagnosis present

## 2019-09-05 DIAGNOSIS — I5030 Unspecified diastolic (congestive) heart failure: Secondary | ICD-10-CM | POA: Diagnosis present

## 2019-09-05 DIAGNOSIS — R531 Weakness: Secondary | ICD-10-CM | POA: Diagnosis not present

## 2019-09-05 LAB — BASIC METABOLIC PANEL
Anion gap: 11 (ref 5–15)
BUN: 10 mg/dL (ref 8–23)
CO2: 25 mmol/L (ref 22–32)
Calcium: 8.6 mg/dL — ABNORMAL LOW (ref 8.9–10.3)
Chloride: 103 mmol/L (ref 98–111)
Creatinine, Ser: 0.96 mg/dL (ref 0.61–1.24)
GFR calc Af Amer: >60 mL/min (ref >60)
GFR calc non Af Amer: >60 mL/min (ref >60)
Glucose, Bld: 129 mg/dL — ABNORMAL HIGH (ref 70–99)
Potassium: 3.3 mmol/L — ABNORMAL LOW (ref 3.5–5.1)
Sodium: 139 mmol/L (ref 135–145)

## 2019-09-05 LAB — TROPONIN I (HIGH SENSITIVITY)
Troponin I (High Sensitivity): 19 ng/L — ABNORMAL HIGH (ref <18)
Troponin I (High Sensitivity): 24 ng/L — ABNORMAL HIGH (ref <18)

## 2019-09-05 LAB — MAGNESIUM: Magnesium: 1.7 mg/dL (ref 1.7–2.4)

## 2019-09-05 LAB — CBC
HCT: 39.4 % (ref 39.0–52.0)
Hemoglobin: 12.4 g/dL — ABNORMAL LOW (ref 13.0–17.0)
MCH: 27 pg (ref 26.0–34.0)
MCHC: 31.5 g/dL (ref 30.0–36.0)
MCV: 85.8 fL (ref 80.0–100.0)
Platelets: 196 10*3/uL (ref 150–400)
RBC: 4.59 MIL/uL (ref 4.22–5.81)
RDW: 13.9 % (ref 11.5–15.5)
WBC: 6.7 10*3/uL (ref 4.0–10.5)
nRBC: 0 % (ref 0.0–0.2)

## 2019-09-05 LAB — TSH: TSH: 1.644 u[IU]/mL (ref 0.350–4.500)

## 2019-09-05 LAB — SARS CORONAVIRUS 2 BY RT PCR (HOSPITAL ORDER, PERFORMED IN ~~LOC~~ HOSPITAL LAB): SARS Coronavirus 2: NEGATIVE

## 2019-09-05 MED ORDER — OXYCODONE-ACETAMINOPHEN 5-325 MG PO TABS
1.0000 | ORAL_TABLET | Freq: Four times a day (QID) | ORAL | Status: DC | PRN
  Administered 2019-09-06: 1 via ORAL
  Filled 2019-09-05: qty 1

## 2019-09-05 MED ORDER — IOHEXOL 350 MG/ML SOLN
75.0000 mL | Freq: Once | INTRAVENOUS | Status: AC | PRN
  Administered 2019-09-05: 75 mL via INTRAVENOUS

## 2019-09-05 MED ORDER — POTASSIUM CHLORIDE 20 MEQ/15ML (10%) PO SOLN
40.0000 meq | Freq: Once | ORAL | Status: AC
  Administered 2019-09-05: 40 meq via ORAL
  Filled 2019-09-05: qty 30

## 2019-09-05 MED ORDER — SENNOSIDES-DOCUSATE SODIUM 8.6-50 MG PO TABS: 1.0000 | ORAL_TABLET | Freq: Every evening | ORAL | Status: DC | PRN

## 2019-09-05 MED ORDER — MAGNESIUM SULFATE IN D5W 1-5 GM/100ML-% IV SOLN
1.0000 g | Freq: Once | INTRAVENOUS | Status: AC
  Administered 2019-09-05: 1 g via INTRAVENOUS
  Filled 2019-09-05: qty 100

## 2019-09-05 MED ORDER — LISINOPRIL 5 MG PO TABS
5.0000 mg | ORAL_TABLET | Freq: Every day | ORAL | Status: DC
  Administered 2019-09-06: 5 mg via ORAL
  Filled 2019-09-05: qty 1

## 2019-09-05 MED ORDER — HEPARIN (PORCINE) 25000 UT/250ML-% IV SOLN
1150.0000 [IU]/h | INTRAVENOUS | Status: DC
  Administered 2019-09-05: 1300 [IU]/h via INTRAVENOUS
  Administered 2019-09-06: 1200 [IU]/h via INTRAVENOUS
  Filled 2019-09-05 (×2): qty 250

## 2019-09-05 MED ORDER — SODIUM CHLORIDE 0.9% FLUSH
3.0000 mL | Freq: Two times a day (BID) | INTRAVENOUS | Status: DC
  Administered 2019-09-06 – 2019-09-11 (×9): 3 mL via INTRAVENOUS

## 2019-09-05 MED ORDER — ACETAMINOPHEN 650 MG RE SUPP: 650.0000 mg | Freq: Four times a day (QID) | RECTAL | Status: DC | PRN

## 2019-09-05 MED ORDER — HEPARIN BOLUS VIA INFUSION
6000.0000 [IU] | Freq: Once | INTRAVENOUS | Status: AC
  Administered 2019-09-05: 6000 [IU] via INTRAVENOUS
  Filled 2019-09-05: qty 6000

## 2019-09-05 MED ORDER — ONDANSETRON HCL 4 MG PO TABS: 4.0000 mg | ORAL_TABLET | Freq: Four times a day (QID) | ORAL | Status: DC | PRN

## 2019-09-05 MED ORDER — ACETAMINOPHEN 325 MG PO TABS
650.0000 mg | ORAL_TABLET | Freq: Four times a day (QID) | ORAL | Status: DC | PRN
  Filled 2019-09-05: qty 2

## 2019-09-05 MED ORDER — ONDANSETRON HCL 4 MG/2ML IJ SOLN
4.0000 mg | Freq: Four times a day (QID) | INTRAMUSCULAR | Status: DC | PRN
  Administered 2019-09-07: 4 mg via INTRAVENOUS
  Filled 2019-09-05: qty 2

## 2019-09-05 NOTE — Progress Notes (Signed)
ANTICOAGULATION CONSULT NOTE - Initial Consult  Pharmacy Consult for heparin  Indication: pulmonary embolus  Allergies  Allergen Reactions  . Amlodipine Swelling    BILATERAL FEET TO ANKLES    Patient Measurements: Height: 5\' 7"  (170.2 cm) Weight: 88.9 kg (195 lb 15.8 oz) IBW/kg (Calculated) : 66.1 Heparin Dosing Weight: 85kg  Vital Signs: Temp: 98.2 F (36.8 C) (07/11 1255) Temp Source: Oral (07/11 1255) BP: 113/83 (07/11 1600) Pulse Rate: 66 (07/11 1600)  Labs: Recent Labs    09/05/19 1419 09/05/19 1535  HGB 12.4*  --   HCT 39.4  --   PLT 196  --   CREATININE 0.96  --   TROPONINIHS 19* 24*    Estimated Creatinine Clearance: 79.4 mL/min (by C-G formula based on SCr of 0.96 mg/dL).   Medical History: Past Medical History:  Diagnosis Date  . Cancer [facility])    prostate  . Hypertension     Assessment: 69 year old male presenting to [facility] post prostate surgery 7/7, presenting with chest tightness and sob. Afib w/rvr noted on arrival. CTA shows bilateral pulmonary embolus. New order received to start IV heparin. CBC drawn in ED wnl.   Does not appear to be on anticoagulants prior to admit.   Goal of Therapy:  Heparin level 0.3-0.7 units/ml Monitor platelets by anticoagulation protocol: Yes   Plan:  Give 6000 units bolus x 1 Start heparin infusion at 1300 units/hr Check anti-Xa level in 6 hours and daily while on heparin Continue to monitor H&H and platelets  Erin Hearing PharmD., BCPS Clinical Pharmacist 09/05/2019 5:52 PM

## 2019-09-05 NOTE — ED Provider Notes (Signed)
Tyler [name]   CSN: [id number] Arrival date & time: 09/05/19  1255     History Chief Complaint  Patient presents with  . Chest Pain  . Shortness of Breath    Tyler [name] is a 69 y.o. male.  He is coming from home by EMS.  He speaks [place] but does speak some [place].  He recently had a robotic prostate surgery.  Apparently he was walking outside when he acutely felt pressure in his chest and was diaphoretic.  Symptoms lasted about 10 minutes and now completely resolved.  EMS said when they got there he was in atrial fibrillation.  By the time they were able to record anything he was back in sinus and the symptoms were improving.  No known prior history of A. fib.  Patient states he feels back to baseline now.  He has had some swelling in his legs since the surgery but is actually much improved.  No abdominal pain.  No fevers or cough.  The history is provided by the patient and the EMS personnel.  Chest Pain Pain location:  Substernal area Pain quality: pressure   Pain severity:  Moderate Onset quality:  Sudden Duration:  10 minutes Timing:  Constant Progression:  Resolved Chronicity:  New Context: movement   Relieved by:  None tried Worsened by:  Nothing Ineffective treatments:  None tried Associated symptoms: diaphoresis and shortness of breath   Associated symptoms: no abdominal pain, no cough, no fever, no headache, no nausea, no syncope and no vomiting   Risk factors: hypertension, male sex and surgery   Risk factors: no smoking   Shortness of Breath Associated symptoms: chest pain and diaphoresis   Associated symptoms: no abdominal pain, no cough, no fever, no headaches, no neck pain, no rash, no sore throat, no syncope and no vomiting        Past Medical History:  Diagnosis Date  . Cancer [facility])    prostate  . Hypertension     Patient Active Problem List   Diagnosis Date Noted  . Prostate cancer ([place])  09/01/2019    Past Surgical History:  Procedure Laterality Date  . LYMPHADENECTOMY Bilateral 09/01/2019   Procedure: LYMPHADENECTOMY;  Surgeon: Alexis Frock, MD;  Location: [facility] ORS;  Service: Urology;  Laterality: Bilateral;  . NO PAST SURGERIES    . ROBOT ASSISTED LAPAROSCOPIC RADICAL PROSTATECTOMY N/A 09/01/2019   Procedure: XI ROBOTIC ASSISTED LAPAROSCOPIC RADICAL PROSTATECTOMY;  Surgeon: Alexis Frock, MD;  Location: [facility] ORS;  Service: Urology;  Laterality: N/A;  3 HRS       No family history on file.  Social History   Tobacco Use  . Smoking status: Never Smoker  . Smokeless tobacco: Never Used  Vaping Use  . Vaping Use: Never used  Substance Use Topics  . Alcohol use: Not Currently  . Drug use: Never    Home Medications Prior to Admission medications   Medication Sig Start Date End Date Taking? Authorizing Provider  lisinopril (ZESTRIL) 5 MG tablet Take 5 mg by mouth daily.    [provider]  oxyCODONE-acetaminophen (PERCOCET) 5-325 MG tablet Take 1 tablet by mouth every 6 (six) hours as needed for moderate pain or severe pain. Post-operatively. 09/02/19 09/01/20  Alexis Frock, MD  senna-docusate (SENOKOT-S) 8.6-50 MG tablet Take 1 tablet by mouth 2 (two) times daily. While taking strong pain meds to prevent constipation. 09/02/19   Alexis Frock, MD  sulfamethoxazole-trimethoprim (BACTRIM DS) 800-160 MG tablet Take 1  tablet by mouth 2 (two) times daily. X 3 days. Begin day before next Urology appointment. 09/02/19   Alexis Frock, MD    Allergies    Amlodipine  Review of Systems   Review of Systems  Constitutional: Positive for diaphoresis. Negative for fever.  HENT: Negative for sore throat.   Eyes: Negative for visual disturbance.  Respiratory: Positive for shortness of breath. Negative for cough.   Cardiovascular: Positive for chest pain. Negative for syncope.  Gastrointestinal: Negative for abdominal pain, nausea and vomiting.  Genitourinary: Negative  for dysuria.  Musculoskeletal: Negative for neck pain.  Skin: Negative for rash.  Neurological: Negative for headaches.    Physical Exam Updated Vital Signs BP 127/81 (BP Location: Right Arm)   Pulse 97   Temp 98.2 F (36.8 C) (Oral)   Resp (!) 23   Ht 5\' 7"  (1.702 m)   Wt 88.9 kg   SpO2 95%   BMI 30.70 kg/m   Physical Exam Vitals and nursing [name] reviewed.  Constitutional:      Appearance: He is well-developed.  HENT:     Head: Normocephalic and atraumatic.  Eyes:     Conjunctiva/sclera: Conjunctivae normal.  Cardiovascular:     Rate and Rhythm: Normal rate and regular rhythm.     Heart sounds: No murmur heard.   Pulmonary:     Effort: Pulmonary effort is normal. No respiratory distress.     Breath sounds: Normal breath sounds.  Abdominal:     Palpations: Abdomen is soft.     Tenderness: There is no abdominal tenderness.  Musculoskeletal:        General: Normal range of motion.     Cervical back: Neck supple.     Right lower leg: No tenderness. Edema present.     Left lower leg: No tenderness. Edema present.  Skin:    General: Skin is warm and dry.     Capillary Refill: Capillary refill takes less than 2 seconds.  Neurological:     General: No focal deficit present.     Mental Status: He is alert.     ED Results / Procedures / Treatments   Labs (all labs ordered are listed, but only abnormal results are displayed) Labs Reviewed  BASIC METABOLIC PANEL - Abnormal; Notable for the following components:      Result Value   Potassium 3.3 (*)    Glucose, Bld 129 (*)    Calcium 8.6 (*)    All other components within normal limits  CBC - Abnormal; Notable for the following components:   Hemoglobin 12.4 (*)    All other components within normal limits  TROPONIN I (HIGH SENSITIVITY) - Abnormal; Notable for the following components:   Troponin I (High Sensitivity) 19 (*)    All other components within normal limits  TROPONIN I (HIGH SENSITIVITY) - Abnormal;  Notable for the following components:   Troponin I (High Sensitivity) 24 (*)    All other components within normal limits  SARS CORONAVIRUS 2 BY RT PCR (HOSPITAL ORDER, [place] [facility])  MAGNESIUM  TSH  HEPARIN LEVEL (UNFRACTIONATED)  CBC    EKG EKG Interpretation  Date/Time:  Sunday September 05 2019 12:55:36 EDT Ventricular Rate:  97 PR Interval:    QRS Duration: 90 QT Interval:  335 QTC Calculation: 426 R Axis:   15 Text Interpretation: Sinus rhythm Borderline T wave abnormalities No significant change since prior 7/21 Confirmed by Aletta Edouard [phone]) on 09/05/2019 2:14:24 PM   Radiology  CT Angio Chest PE W and/or Wo Contrast  Addendum Date: 09/05/2019   ADDENDUM REPORT: 09/05/2019 17:14 ADDENDUM: These results were called by telephone at the time of interpretation on 09/05/2019 at 5:14 pm to provider JOSHUA LONG , who verbally acknowledged these results. Electronically Signed   By: Constance Holster M.D.   On: 09/05/2019 17:14   Result Date: 09/05/2019 CLINICAL DATA:  PE suspected. EXAM: CT ANGIOGRAPHY CHEST WITH CONTRAST TECHNIQUE: Multidetector CT imaging of the chest was performed using the standard protocol during bolus administration of intravenous contrast. Multiplanar CT image reconstructions and MIPs were obtained to evaluate the vascular anatomy. CONTRAST:  63mL OMNIPAQUE IOHEXOL 350 MG/ML SOLN COMPARISON:  CT dated May 28 21 FINDINGS: Cardiovascular: Contrast injection is sufficient to demonstrate satisfactory opacification of the pulmonary arteries to the segmental level. There are acute bilateral lobar, segmental, and subsegmental pulmonary emboli. The size of the main pulmonary artery is normal. There is CT evidence for right-sided heart strain with an RV LV ratio measuring approximately 1. The heart is enlarged. There is no significant pericardial effusion. There is reflux of contrast in the IVC. Mediastinum/Nodes: -- No mediastinal lymphadenopathy.  -- No hilar lymphadenopathy. -- No axillary lymphadenopathy. -- No supraclavicular lymphadenopathy. -- Normal thyroid gland where visualized. -  Unremarkable esophagus. Lungs/Pleura: There is atelectasis at the lung bases. There is no pneumothorax. No significant pleural effusion. Upper Abdomen: Contrast bolus timing is not optimized for evaluation of the abdominal organs. There appears to be a nonobstructing stone in the upper pole the right kidney. Subcutaneous gas is noted along the patient's upper abdomen, presumably related to the patient's recent robotic assisted laparoscopic radical prostatectomy. Musculoskeletal: No chest wall abnormality. No bony spinal canal stenosis. Review of the MIP images confirms the above findings. IMPRESSION: 1. There are acute bilateral lobar, segmental, and subsegmental pulmonary emboli. There is CT evidence for right-sided heart strain with an RV/LV ratio measuring approximately 1. 2. There is atelectasis at the lung bases. 3. Subcutaneous gas is noted along the patient's upper abdomen, presumably related to the patient's recent robotic assisted laparoscopic radical prostatectomy. 4. Cardiomegaly. Electronically Signed: By: Constance Holster M.D. On: 09/05/2019 17:05   DG Chest Port 1 View  Result Date: 09/05/2019 CLINICAL DATA:  Shortness of breath EXAM: PORTABLE CHEST 1 VIEW COMPARISON:  February 25, 2019 FINDINGS: The heart size and mediastinal contours are within normal limits. Both lungs are clear. The visualized skeletal structures are unremarkable. IMPRESSION: No active disease. Electronically Signed   By: Dorise Bullion III M.D   On: 09/05/2019 13:45    Procedures Procedures (including critical care time)  Medications Ordered in ED Medications  heparin ADULT infusion 100 units/mL (25000 units/275mL sodium chloride 0.45%) (1,300 Units/hr Intravenous New Bag/Given 09/05/19 1808)  iohexol (OMNIPAQUE) 350 MG/ML injection 75 mL (75 mLs Intravenous Contrast Given  09/05/19 1623)  heparin bolus via infusion 6,000 Units (6,000 Units Intravenous Bolus from Bag 09/05/19 1815)    ED Course  I have reviewed the triage vital signs and the nursing notes.  Pertinent labs & imaging results that were available during my care of the patient were reviewed by me and considered in my medical decision making (see chart for details).  Clinical Course as of Sep 04 1728  Sun Sep 05, 2019  1351 Patient's daughter is here now was able to help translate and also relay what she saw.  She said he was having this chest pressure was diaphoretic.  She said his heart rate was irregular  and rapid.  She checked his blood pressure and it was okay.  She agreed that the symptoms lasted about 10 minutes and he currently is denying any complaints.   [MB]  2023 Chest x-ray interpreted by me as no gross infiltrates.   [MB]    Clinical Course User Index [MB] Hayden Rasmussen, MD   MDM Rules/Calculators/A&P                         This patient complains of chest pain and shortness of breath; this involves an extensive number of treatment Options and is a complaint that carries with it a high risk of complications and Morbidity. The differential includes acs, PE, pneumonia, near syncope, anemia  I ordered, reviewed and interpreted labs, which included CBC with normal white count, slightly low hemoglobin, chemistries with slightly low potassium 3.3, elevated glucose, normal renal function, troponin elevated at 19 and slightly rising to 24 I ordered imaging studies which included chest x-ray and I independently    visualized and interpreted imaging which showed no acute findings Additional history obtained from patient's daughter Previous records obtained and reviewed in epic including op [name] from prior robotic prostatectomy surgery  After the interventions stated above, I reevaluated the patient and found patient to be pain-free and with stable vitals normal oxygenation.  Signed out to  Dr. Laverta Baltimore to follow-up on delta troponin and likely ordering of CT angio due to patient's abrupt onset of symptoms postoperatively.  Disposition per results of testing.   Final Clinical Impression(s) / ED Diagnoses Final diagnoses:  Multiple subsegmental pulmonary emboli without acute cor pulmonale ([place])    Rx / DC Orders ED Discharge Orders    None       Hayden Rasmussen, MD 09/05/19 1849

## 2019-09-05 NOTE — H&P (Signed)
History and Physical    Tyler [name] ZCH:[id number] DOB: 04/26/1950 DOA: 09/05/2019  PCP: [name], FNP  Patient coming from: Home  I have personally briefly reviewed patient's old medical records in [place]  Chief Complaint: Shortness of breath  HPI: Ricahrd [name] is a 69 y.o. male with medical history significant for prostate cancer s/p robotic assisted laparoscopic radical prostatectomy on 09/01/2019 and hypertension who presents to the ED for evaluation of chest pain and shortness of breath.  Daughter is at bedside and provides supplemental history.  Patient underwent robotic assisted laparoscopic radical prostatectomy on 09/01/2019 by urology, Dr. [name].  He received perioperative antibiotics with Ancef.  He was hospitalized overnight and discharged on 09/02/2019 without any postop complication.  At home he had been managing pain with Percocet and less mobile from 1-2 days postop.  He did notice some swelling in both of his lower extremities which was felt to be dependent edema and has now resolved.  Around 11 AM today (09/05/2019) he was walking outside when he suddenly became short of breath.  He had associated palpitations and some chest discomfort.  He called his daughter who lives upstairs from him for help.  She knows he was diaphoretic and brought him inside to sit down.  She is in healthcare and check blood pressure in bilateral arms which she reported were normal.  She used her stethoscope to listen to his lungs which sounded clear.  When listening to his heart she felt his heart rhythm was irregular.  She called EMS for further evaluation.  On EMS arrival patient was noted to be in atrial fibrillation with heart rate up to 160 before returning to normal sinus rhythm spontaneously.  He was given aspirin 324 mg and 250 mL normal saline on route to the ED with complete resolution of his chest discomfort.  ED Course:  Initial vitals showed BP 114/80, pulse 105, RR 25, temp  98.2 Fahrenheit, SPO2 97% on room air.  Labs are notable for WBC 6.7, hemoglobin 12.4, platelets 196,000, sodium 139, potassium 3.3, bicarb 25, BUN 10, creatinine 0.96, magnesium 1.7, TSH 1.644, high-sensitivity troponin I 19 >> 24.  SARS-CoV-2 PCR is obtained and pending.  Portable chest x-ray is negative for focal consolidation, edema, or effusion.  CTA chest PE study shows acute bilateral lobar, segmental, and subsegmental pulmonary emboli with CT evidence suggestive of right-sided heart strain with an RV/LV ratio measuring approximately 1.  Atelectasis at the lung bases is seen.  Subcutaneous gas noted on the patient's upper abdomen, suspected related to patient's recent robotic assisted laparoscopic radical prostatectomy.  EDP discussed the case with patient's urologist who is okay with anticoagulation in the setting of recent prostatectomy.  He advised that it would be expected to see some blood in the Foley catheter while on heparin which would be normal as long as there is no retention.  Patient was started on IV heparin and the hospitalist service was consulted to admit for further evaluation and management.  Review of Systems: All systems reviewed and are negative except as documented in history of present illness above.   Past Medical History:  Diagnosis Date  . Cancer [facility])    prostate  . Hypertension     Past Surgical History:  Procedure Laterality Date  . LYMPHADENECTOMY Bilateral 09/01/2019   Procedure: LYMPHADENECTOMY;  Surgeon: Alexis Frock, MD;  Location: [facility];  Service: Urology;  Laterality: Bilateral;  . NO PAST SURGERIES    . ROBOT ASSISTED LAPAROSCOPIC RADICAL  PROSTATECTOMY N/A 09/01/2019   Procedure: XI ROBOTIC ASSISTED LAPAROSCOPIC RADICAL PROSTATECTOMY;  Surgeon: Alexis Frock, MD;  Location: [facility];  Service: Urology;  Laterality: N/A;  3 HRS    Social History:  reports that he has never smoked. He has never used smokeless tobacco. He reports previous  alcohol use. He reports that he does not use drugs.  Allergies  Allergen Reactions  . Amlodipine Swelling    BILATERAL FEET TO ANKLES    Family History  Problem Relation Age of Onset  . Stroke Father      Prior to Admission medications   Medication Sig Start Date End Date Taking? Authorizing Provider  lisinopril (ZESTRIL) 5 MG tablet Take 5 mg by mouth daily.   Yes [provider]  oxyCODONE-acetaminophen (PERCOCET) 5-325 MG tablet Take 1 tablet by mouth every 6 (six) hours as needed for moderate pain or severe pain. Post-operatively. 09/02/19 09/01/20 Yes Alexis Frock, MD  senna-docusate (SENOKOT-S) 8.6-50 MG tablet Take 1 tablet by mouth 2 (two) times daily. While taking strong pain meds to prevent constipation. 09/02/19  Yes Alexis Frock, MD  sulfamethoxazole-trimethoprim (BACTRIM DS) 800-160 MG tablet Take 1 tablet by mouth 2 (two) times daily. X 3 days. Begin day before next Urology appointment. Patient taking differently: Take 1 tablet by mouth See admin instructions. Take one tablet by mouth twice daily for 3 days.  Begin day before next Urology appointment that is scheduled for 09/13/2019. 09/02/19   Alexis Frock, MD    Physical Exam: Vitals:   09/05/19 1345 09/05/19 1400 09/05/19 1530 09/05/19 1600  BP: 117/85 119/86 124/77 113/83  Pulse: 85 87 68 66  Resp: 20 (!) 26 17 (!) 21  Temp:      TempSrc:      SpO2: 95% 96% 96% 96%  Weight:      Height:       Constitutional: Resting supine in bed, NAD, calm, comfortable Eyes: PERRL, lids and conjunctivae normal ENMT: Mucous membranes are moist. Posterior pharynx clear of any exudate or lesions.Normal dentition.  Neck: normal, supple, no masses. Respiratory: clear to auscultation bilaterally, no wheezing, no crackles. Normal respiratory effort. No accessory muscle use.  Cardiovascular: Regular rate and rhythm, no murmurs / rubs / gallops. No extremity edema. 2+ pedal pulses. Abdomen: Well-healing laparoscopic  surgical incisions, no tenderness, no masses palpated. No hepatosplenomegaly. Bowel sounds positive.  Musculoskeletal: no clubbing / cyanosis. No joint deformity upper and lower extremities. Good ROM, no contractures. Normal muscle tone.  Skin: no rashes, lesions, ulcers. No induration Neurologic: CN 2-12 grossly intact. Sensation intact, Strength 5/5 in all 4.  Psychiatric: Normal judgment and insight. Alert and oriented x 3. Normal mood.     Labs on Admission: I have personally reviewed following labs and imaging studies  CBC: Recent Labs  Lab 09/01/19 1612 09/02/19 0527 09/05/19 1419  WBC  --   --  6.7  HGB 13.7 11.6* 12.4*  HCT 43.9 36.1* 39.4  MCV  --   --  85.8  PLT  --   --  962   Basic Metabolic Panel: Recent Labs  Lab 09/02/19 0527 09/05/19 1419  NA 138 139  K 4.8 3.3*  CL 109 103  CO2 22 25  GLUCOSE 136* 129*  BUN 13 10  CREATININE 1.11 0.96  CALCIUM 8.2* 8.6*  MG  --  1.7   GFR: Estimated Creatinine Clearance: 79.4 mL/min (by C-G formula based on SCr of 0.96 mg/dL). Liver Function Tests: No results for input(s): AST,  ALT, ALKPHOS, BILITOT, PROT, ALBUMIN in the last 168 hours. No results for input(s): LIPASE, AMYLASE in the last 168 hours. No results for input(s): AMMONIA in the last 168 hours. Coagulation Profile: No results for input(s): INR, PROTIME in the last 168 hours. Cardiac Enzymes: No results for input(s): CKTOTAL, CKMB, CKMBINDEX, TROPONINI in the last 168 hours. BNP (last 3 results) No results for input(s): PROBNP in the last 8760 hours. HbA1C: No results for input(s): HGBA1C in the last 72 hours. CBG: No results for input(s): GLUCAP in the last 168 hours. Lipid Profile: No results for input(s): CHOL, HDL, LDLCALC, TRIG, CHOLHDL, LDLDIRECT in the last 72 hours. Thyroid Function Tests: Recent Labs    09/05/19 1419  TSH 1.644   Anemia Panel: No results for input(s): VITAMINB12, FOLATE, FERRITIN, TIBC, IRON, RETICCTPCT in the last 72  hours. Urine analysis: No results found for: COLORURINE, APPEARANCEUR, LABSPEC, [place], GLUCOSEU, HGBUR, BILIRUBINUR, KETONESUR, PROTEINUR, UROBILINOGEN, NITRITE, LEUKOCYTESUR  Radiological Exams on Admission: CT Angio Chest PE W and/or Wo Contrast  Addendum Date: 09/05/2019   ADDENDUM REPORT: 09/05/2019 17:14 ADDENDUM: These results were called by telephone at the time of interpretation on 09/05/2019 at 5:14 pm to provider JOSHUA LONG , who verbally acknowledged these results. Electronically Signed   By: Constance Holster M.D.   On: 09/05/2019 17:14   Result Date: 09/05/2019 CLINICAL DATA:  PE suspected. EXAM: CT ANGIOGRAPHY CHEST WITH CONTRAST TECHNIQUE: Multidetector CT imaging of the chest was performed using the standard protocol during bolus administration of intravenous contrast. Multiplanar CT image reconstructions and MIPs were obtained to evaluate the vascular anatomy. CONTRAST:  46mL OMNIPAQUE IOHEXOL 350 MG/ML SOLN COMPARISON:  CT dated May 28 21 FINDINGS: Cardiovascular: Contrast injection is sufficient to demonstrate satisfactory opacification of the pulmonary arteries to the segmental level. There are acute bilateral lobar, segmental, and subsegmental pulmonary emboli. The size of the main pulmonary artery is normal. There is CT evidence for right-sided heart strain with an RV LV ratio measuring approximately 1. The heart is enlarged. There is no significant pericardial effusion. There is reflux of contrast in the IVC. Mediastinum/Nodes: -- No mediastinal lymphadenopathy. -- No hilar lymphadenopathy. -- No axillary lymphadenopathy. -- No supraclavicular lymphadenopathy. -- Normal thyroid gland where visualized. -  Unremarkable esophagus. Lungs/Pleura: There is atelectasis at the lung bases. There is no pneumothorax. No significant pleural effusion. Upper Abdomen: Contrast bolus timing is not optimized for evaluation of the abdominal organs. There appears to be a nonobstructing stone in the  upper pole the right kidney. Subcutaneous gas is noted along the patient's upper abdomen, presumably related to the patient's recent robotic assisted laparoscopic radical prostatectomy. Musculoskeletal: No chest wall abnormality. No bony spinal canal stenosis. Review of the MIP images confirms the above findings. IMPRESSION: 1. There are acute bilateral lobar, segmental, and subsegmental pulmonary emboli. There is CT evidence for right-sided heart strain with an RV/LV ratio measuring approximately 1. 2. There is atelectasis at the lung bases. 3. Subcutaneous gas is noted along the patient's upper abdomen, presumably related to the patient's recent robotic assisted laparoscopic radical prostatectomy. 4. Cardiomegaly. Electronically Signed: By: Constance Holster M.D. On: 09/05/2019 17:05   DG Chest Port 1 View  Result Date: 09/05/2019 CLINICAL DATA:  Shortness of breath EXAM: PORTABLE CHEST 1 VIEW COMPARISON:  February 25, 2019 FINDINGS: The heart size and mediastinal contours are within normal limits. Both lungs are clear. The visualized skeletal structures are unremarkable. IMPRESSION: No active disease. Electronically Signed   By: Dorise Bullion  III M.D   On: 09/05/2019 13:45    EKG: Independently reviewed. Sinus tachycardia with borderline T wave changes.  Rate is faster when compared to prior.  Assessment/Plan Principal Problem:   Bilateral pulmonary embolism (HCC) Active Problems:   Prostate cancer (HCC)   Hypertension   Hypokalemia   Paroxysmal atrial fibrillation (HCC)  Mikael Skoda is a 70 y.o. male with medical history significant for prostate cancer s/p robotic assisted laparoscopic radical prostatectomy on 09/01/2019 and hypertension who is admitted with bilateral pulmonary emboli.  Bilateral pulmonary emboli: -Continue IV heparin anticoagulation overnight -Obtain echocardiogram -Obtain lower extremity Dopplers -Check blood cultures -Supplemental oxygen if needed  Paroxysmal  atrial fibrillation: Seen on EKG strip on EMS arrival with spontaneous conversion back to normal sinus rhythm prior to ED arrival.  Suspect secondary to PE.  No prior known history of atrial fibrillation. -Continue heparin anticoagulation as above -Checking echocardiogram -Monitor on telemetry  Prostate cancer s/p robotic assisted laparoscopic radical prostatectomy and bilateral pelvic node dissection on 09/01/2019: With Foley catheter in place.  EDP discussed with his urologist, Dr. [name], who advised that it would be expected to see some blood in the Foley catheter while on heparin which would be normal as long as there is no retention. -Continue Foley catheter care, monitor urine output -Continue Percocet as needed for pain control  Hypokalemia: Magnesium 1.7.  Replete magnesium and potassium repeat labs in the morning.  Hypertension: Continue lisinopril.  DVT prophylaxis: Heparin anticoagulation Code Status: Full code, confirmed with patient Family Communication: Discussed with patient's daughter at bedside Disposition Plan: From home and likely discharge to home pending further PE work-up Consults called: EDP discussed with on-call urology Admission status:  Status is: Observation  The patient remains OBS appropriate and will d/c before 2 midnights.  Dispo: The patient is from: Home              Anticipated d/c is to: Home              Anticipated d/c date is: 1 day              Patient currently is not medically stable to d/c.   Zada Finders MD [facility]  If 7PM-7AM, please contact night-coverage [email or web address]  09/05/2019, 7:15 PM

## 2019-09-05 NOTE — ED Notes (Signed)
Patient returned from CT

## 2019-09-05 NOTE — ED Provider Notes (Signed)
Blood pressure 124/77, pulse 68, temperature 98.2 F (36.8 C), temperature source Oral, resp. rate 17, height 5\' 7"  (1.702 m), weight 88.9 kg, SpO2 96 %.  Assuming care from Dr. Melina Copa.  In short, Tyler [name] is a 69 y.o. male with a chief complaint of Chest Pain and Shortness of Breath .  Refer to the original H&P for additional details.  The current plan of care is to f/u after labs and reassess.  05:34 PM  Patient with CT findings consistent with pulmonary embolism.  Discussed the case with the radiologist to notes there was some suggestion of right heart strain on CT.  Discussed the case with Dr. [name] with urology.  He had prostatectomy on 09/02/2019.  Dr. [name] is okay with anticoagulation in this setting.  Will order heparin.  He states that it would be expected on heparin to have some blood in the Foley catheter which would be normal as Tyler [name] as there is no retention.  Updated patient and daughter at bedside with plan to admit.    Discussed patient's case with [facility] to request admission. Patient and family (if present) updated with plan. Care transferred to [facility] service.  I reviewed all nursing notes, vitals, pertinent old records, EKGs, labs, imaging (as available).   CRITICAL CARE Performed by: Margette Fast Total critical care time: 35 minutes Critical care time was exclusive of separately billable procedures and treating other patients. Critical care was necessary to treat or prevent imminent or life-threatening deterioration. Critical care was time spent personally by me on the following activities: development of treatment plan with patient and/or surrogate as well as nursing, discussions with consultants, evaluation of patient's response to treatment, examination of patient, obtaining history from patient or surrogate, ordering and performing treatments and interventions, ordering and review of laboratory studies, ordering and review of radiographic studies, pulse oximetry and  re-evaluation of patient's condition.  Nanda Quinton, MD Emergency Medicine     Deshay Blumenfeld, [facility], MD 09/08/19 [id number]

## 2019-09-05 NOTE — ED Notes (Signed)
Patient transported to CT 

## 2019-09-05 NOTE — ED Triage Notes (Signed)
Pt bib ems from home. Pt had prostrate sx on 7/7 and has foley. Pt has been doing well. Today he went outside and felt tightness in chest and sob. Pt was brought inside. EMS noted afib at 160, he returned to NSR. Pt given 324mg  ASA, no NTG as bp was soft  110/70 and chest discomfort had resolved. Pt given 250 ml NS. Pt takes lisinopril and is on pain meds from recent sx.

## 2019-09-06 ENCOUNTER — Ambulatory Visit (HOSPITAL_COMMUNITY): Payer: 59

## 2019-09-06 ENCOUNTER — Observation Stay (HOSPITAL_COMMUNITY): Payer: 59

## 2019-09-06 DIAGNOSIS — C61 Malignant neoplasm of prostate: Secondary | ICD-10-CM

## 2019-09-06 DIAGNOSIS — D72829 Elevated white blood cell count, unspecified: Secondary | ICD-10-CM | POA: Diagnosis present

## 2019-09-06 DIAGNOSIS — E876 Hypokalemia: Secondary | ICD-10-CM | POA: Diagnosis present

## 2019-09-06 DIAGNOSIS — I2699 Other pulmonary embolism without acute cor pulmonale: Secondary | ICD-10-CM

## 2019-09-06 DIAGNOSIS — I5030 Unspecified diastolic (congestive) heart failure: Secondary | ICD-10-CM | POA: Diagnosis present

## 2019-09-06 DIAGNOSIS — I480 Paroxysmal atrial fibrillation: Secondary | ICD-10-CM | POA: Diagnosis present

## 2019-09-06 DIAGNOSIS — N3289 Other specified disorders of bladder: Secondary | ICD-10-CM | POA: Diagnosis present

## 2019-09-06 DIAGNOSIS — R531 Weakness: Secondary | ICD-10-CM | POA: Diagnosis present

## 2019-09-06 DIAGNOSIS — I10 Essential (primary) hypertension: Secondary | ICD-10-CM | POA: Diagnosis not present

## 2019-09-06 DIAGNOSIS — D62 Acute posthemorrhagic anemia: Secondary | ICD-10-CM | POA: Diagnosis not present

## 2019-09-06 DIAGNOSIS — I2602 Saddle embolus of pulmonary artery with acute cor pulmonale: Secondary | ICD-10-CM | POA: Diagnosis not present

## 2019-09-06 DIAGNOSIS — I2694 Multiple subsegmental pulmonary emboli without acute cor pulmonale: Secondary | ICD-10-CM | POA: Diagnosis not present

## 2019-09-06 DIAGNOSIS — R319 Hematuria, unspecified: Secondary | ICD-10-CM | POA: Diagnosis present

## 2019-09-06 DIAGNOSIS — Z20822 Contact with and (suspected) exposure to covid-19: Secondary | ICD-10-CM | POA: Diagnosis not present

## 2019-09-06 DIAGNOSIS — Z86711 Personal history of pulmonary embolism: Secondary | ICD-10-CM | POA: Diagnosis not present

## 2019-09-06 DIAGNOSIS — R109 Unspecified abdominal pain: Secondary | ICD-10-CM

## 2019-09-06 DIAGNOSIS — Z9079 Acquired absence of other genital organ(s): Secondary | ICD-10-CM | POA: Diagnosis not present

## 2019-09-06 DIAGNOSIS — I110 Hypertensive heart disease with heart failure: Secondary | ICD-10-CM | POA: Diagnosis present

## 2019-09-06 DIAGNOSIS — I959 Hypotension, unspecified: Secondary | ICD-10-CM | POA: Diagnosis not present

## 2019-09-06 LAB — CBC
HCT: 37 % — ABNORMAL LOW (ref 39.0–52.0)
Hemoglobin: 11.8 g/dL — ABNORMAL LOW (ref 13.0–17.0)
MCH: 27.3 pg (ref 26.0–34.0)
MCHC: 31.9 g/dL (ref 30.0–36.0)
MCV: 85.6 fL (ref 80.0–100.0)
Platelets: 211 10*3/uL (ref 150–400)
RBC: 4.32 MIL/uL (ref 4.22–5.81)
RDW: 14 % (ref 11.5–15.5)
WBC: 7.3 10*3/uL (ref 4.0–10.5)
nRBC: 0 % (ref 0.0–0.2)

## 2019-09-06 LAB — BASIC METABOLIC PANEL
Anion gap: 9 (ref 5–15)
BUN: 11 mg/dL (ref 8–23)
CO2: 26 mmol/L (ref 22–32)
Calcium: 8.6 mg/dL — ABNORMAL LOW (ref 8.9–10.3)
Chloride: 104 mmol/L (ref 98–111)
Creatinine, Ser: 1.17 mg/dL (ref 0.61–1.24)
GFR calc Af Amer: >60 mL/min (ref >60)
GFR calc non Af Amer: >60 mL/min (ref >60)
Glucose, Bld: 105 mg/dL — ABNORMAL HIGH (ref 70–99)
Potassium: 3.9 mmol/L (ref 3.5–5.1)
Sodium: 139 mmol/L (ref 135–145)

## 2019-09-06 LAB — ECHOCARDIOGRAM COMPLETE
Height: 67 in
Weight: 3135.82 oz

## 2019-09-06 LAB — MAGNESIUM: Magnesium: 1.7 mg/dL (ref 1.7–2.4)

## 2019-09-06 LAB — HEPARIN LEVEL (UNFRACTIONATED)
Heparin Unfractionated: 0.92 IU/mL — ABNORMAL HIGH (ref 0.30–0.70)
Heparin Unfractionated: 0.53 IU/mL (ref 0.30–0.70)
Heparin Unfractionated: 0.73 IU/mL — ABNORMAL HIGH (ref 0.30–0.70)

## 2019-09-06 LAB — HIV ANTIBODY (ROUTINE TESTING W REFLEX): HIV Screen 4th Generation wRfx: NONREACTIVE

## 2019-09-06 MED ORDER — SENNOSIDES-DOCUSATE SODIUM 8.6-50 MG PO TABS
1.0000 | ORAL_TABLET | Freq: Two times a day (BID) | ORAL | Status: DC
  Administered 2019-09-06 – 2019-09-11 (×11): 1 via ORAL
  Filled 2019-09-06 (×11): qty 1

## 2019-09-06 MED ORDER — HYDROMORPHONE HCL 1 MG/ML IJ SOLN
1.0000 mg | Freq: Once | INTRAMUSCULAR | Status: AC | PRN
  Administered 2019-09-06: 1 mg via INTRAVENOUS
  Filled 2019-09-06: qty 1

## 2019-09-06 MED ORDER — IOHEXOL 9 MG/ML PO SOLN
ORAL | Status: AC
  Administered 2019-09-06: 500 mL
  Filled 2019-09-06: qty 1000

## 2019-09-06 MED ORDER — HYDROMORPHONE HCL 1 MG/ML IJ SOLN
0.5000 mg | INTRAMUSCULAR | Status: AC | PRN
  Administered 2019-09-07 (×2): 1 mg via INTRAVENOUS
  Filled 2019-09-06 (×2): qty 1

## 2019-09-06 MED ORDER — OXYCODONE-ACETAMINOPHEN 5-325 MG PO TABS
1.0000 | ORAL_TABLET | Freq: Four times a day (QID) | ORAL | Status: DC | PRN
  Administered 2019-09-08 (×2): 1 via ORAL
  Filled 2019-09-06 (×2): qty 1

## 2019-09-06 MED ORDER — IOHEXOL 9 MG/ML PO SOLN
500.0000 mL | ORAL | Status: DC
  Administered 2019-09-06: 500 mL via ORAL

## 2019-09-06 NOTE — ED Notes (Signed)
Dr. Horris Latino made aware of urine becoming more pink/red in foley tubing and bag. MD states this is normal per urology, but to notify if not draining appropriately. Foley draining at this time.

## 2019-09-06 NOTE — Progress Notes (Signed)
Patient seen for acute abdominal and bilateral flank pain.   He is s/p prostatectomy on 7/7, on IV heparin for PE, and developed acute pain in lower abd and b/l flanks this evening.   Foley with blood-tinged urine, no clots seen. Patient not in any respiratory distress but appears uncomfortable with some abdominal distension. Abd is soft, tender in lower quadrants without peritoneal signs.   Plan to check CT abd/pelvis, control pain. Discussed plan with RN, patient, and patient's daughter at bedside.

## 2019-09-06 NOTE — Progress Notes (Signed)
VASCULAR LAB    Bilateral lower extremity venous duplex completed.    Preliminary report:  See CV proc for preliminary results.  Jeyla Bulger, RVT 09/06/2019, 10:08 AM

## 2019-09-06 NOTE — ED Notes (Signed)
Report called to [place] on 3E

## 2019-09-06 NOTE — Progress Notes (Signed)
PROGRESS NOTE  Tyler [name] WPY:[id number] DOB: [birth year] DOA: 09/05/2019 PCP: Tyler Mowers, FNP  HPI/Recap of past 24 hours: HPI from Dr Tyler [name] is a 69 y.o. male with medical history significant for prostate cancer s/p robotic assisted laparoscopic radical prostatectomy on 09/01/2019 and hypertension who presents to the ED for evaluation of chest pain and shortness of breath. Daughter provider history. Patient was hospitalized overnight and discharged on 09/02/2019 without any postop complication. On 09/05/2019, pt was walking outside when he suddenly became short of breath.  He had associated palpitations and some chest discomfort. Daughter called EMS for further evaluation. Patient was noted to be in atrial fibrillation with heart rate up to 160 before returning to normal sinus rhythm spontaneously. In ED, VS BP 114/80, pulse 105, RR 25, SPO2 97% on RA. Labs are notable for high-sensitivity troponin I 19 >> 24, otherwise stable. SARS-CoV-2 PCR negative. CTA chest PE study shows acute bilateral lobar, segmental, and subsegmental pulmonary emboli with CT evidence suggestive of right-sided heart strain with an RV/LV ratio measuring approximately 1. Patient was started on IV heparin and the hospitalist service was consulted to admit for further evaluation and management.     Today, patient denies any new complaints, reports some intermittent cough with sputum production.  Noted to have some tenderness around the laparoscopic incision sites.  Patient denies any further chest pain, palpitations, worsening shortness of breath, fever/chills.  Assessment/Plan: Principal Problem:   Bilateral pulmonary embolism (HCC) Active Problems:   Prostate cancer (HCC)   Hypertension   Hypokalemia   Paroxysmal atrial fibrillation (HCC)   Acute bilateral pulmonary embolism Currently saturating well on room air Troponin 19-->24, likely due to strain CTA chest showed acute bilateral lobar,  segmental, and subsegmental pulmonary emboli with CT evidence suggestive of right-sided heart strain with an RV/LV ratio measuring approximately 1 Bilateral lower extremity Doppler negative for DVT although portions of the exam were limited Echo pending Continue IV heparin, plan to transition to NOAC on 09/07/2019 Telemetry  ??Paroxysmal A. Fib Currently heart rate controlled, in sinus rhythm Seen on EKG strip by EMS, spontaneously converted back to normal sinus rhythm prior to ED arrival EKG in the ED shows sinus rhythm, repeat pending Echo pending Telemetry  Hypertension Continue lisinopril  Prostate cancer status post robotic assisted laparoscopic radical prostatectomy and bilateral pelvic node dissection on 09/01/2019 Foley catheter in place, urologist Dr. Tresa [name] advised that hematuria is expected, but to watch out for retention since patient is going to be on anticoagulation Pain management Continue Foley catheter care       Malnutrition Type:      Malnutrition Characteristics:      Nutrition Interventions:       Estimated body mass index is 30.7 kg/m as calculated from the following:   Height as of this encounter: 5\' 7"  (1.702 m).   Weight as of this encounter: 88.9 kg.     Code Status: Full  Family Communication: Discussed extensively with patient  Disposition Plan: Status is: Observation  The patient will require care spanning > 2 midnights and should be moved to inpatient because: Inpatient level of care appropriate due to severity of illness  Dispo: The patient is from: Home              Anticipated d/c is to: Home              Anticipated d/c date is: 1 day  Patient currently is not medically stable to d/c.    Consultants:  None  Procedures:  None  Antimicrobials:  None  DVT prophylaxis: IV Heparin   Objective: Vitals:   09/06/19 0800 09/06/19 0817 09/06/19 0830 09/06/19 0850  BP: 137/90  136/88   Pulse: 63  80     Resp: (!) 21 19  17   Temp:      TempSrc:      SpO2: 97%  98% 97%  Weight:      Height:        Intake/Output Summary (Last 24 hours) at 09/06/2019 1049 Last data filed at 09/06/2019 0838 Gross per 24 hour  Intake --  Output 1900 ml  Net -1900 ml   Filed Weights   09/05/19 1255 09/05/19 1323  Weight: 88.9 kg 88.9 kg    Exam:  General: NAD   Cardiovascular: S1, S2 present  Respiratory: CTAB  Abdomen: Soft, tender around laparoscopic incision sites, mildly distended, bowel sounds present  Musculoskeletal: No bilateral pedal edema noted, BLE chronic skin changes  Skin:  BLE chronic skin changes  Psychiatry: Normal mood    Data Reviewed: CBC: Recent Labs  Lab 09/01/19 1612 09/02/19 0527 09/05/19 1419 09/06/19 0218  WBC  --   --  6.7 7.3  HGB 13.7 11.6* 12.4* 11.8*  HCT 43.9 36.1* 39.4 37.0*  MCV  --   --  85.8 85.6  PLT  --   --  196 277   Basic Metabolic Panel: Recent Labs  Lab 09/02/19 0527 09/05/19 1419 09/06/19 0218  NA 138 139 139  K 4.8 3.3* 3.9  CL 109 103 104  CO2 22 25 26   GLUCOSE 136* 129* 105*  BUN 13 10 11   CREATININE 1.11 0.96 1.17  CALCIUM 8.2* 8.6* 8.6*  MG  --  1.7 1.7   GFR: Estimated Creatinine Clearance: 65.2 mL/min (by C-G formula based on SCr of 1.17 mg/dL). Liver Function Tests: No results for input(s): AST, ALT, ALKPHOS, BILITOT, PROT, ALBUMIN in the last 168 hours. No results for input(s): LIPASE, AMYLASE in the last 168 hours. No results for input(s): AMMONIA in the last 168 hours. Coagulation Profile: No results for input(s): INR, PROTIME in the last 168 hours. Cardiac Enzymes: No results for input(s): CKTOTAL, CKMB, CKMBINDEX, TROPONINI in the last 168 hours. BNP (last 3 results) No results for input(s): PROBNP in the last 8760 hours. HbA1C: No results for input(s): HGBA1C in the last 72 hours. CBG: No results for input(s): GLUCAP in the last 168 hours. Lipid Profile: No results for input(s): CHOL, HDL, LDLCALC,  TRIG, CHOLHDL, LDLDIRECT in the last 72 hours. Thyroid Function Tests: Recent Labs    09/05/19 1419  TSH 1.644   Anemia Panel: No results for input(s): VITAMINB12, FOLATE, FERRITIN, TIBC, IRON, RETICCTPCT in the last 72 hours. Urine analysis: No results found for: COLORURINE, APPEARANCEUR, LABSPEC, PHURINE, GLUCOSEU, HGBUR, BILIRUBINUR, KETONESUR, PROTEINUR, UROBILINOGEN, NITRITE, LEUKOCYTESUR Sepsis Labs: @LABRCNTIP (procalcitonin:4,lacticidven:4)  ) Recent Results (from the past 240 hour(s))  SARS CORONAVIRUS 2 (TAT 6-24 HRS) Nasopharyngeal Nasopharyngeal Swab     Status: None   Collection Time: 08/28/19 11:05 AM   Specimen: Nasopharyngeal Swab  Result Value Ref Range Status   SARS Coronavirus 2 NEGATIVE NEGATIVE Final    Comment: (NOTE) SARS-CoV-2 target nucleic acids are NOT DETECTED.  The SARS-CoV-2 RNA is generally detectable in upper and lower respiratory specimens during the acute phase of infection. Negative results do not preclude SARS-CoV-2 infection, do not rule out co-infections with other  pathogens, and should not be used as the sole basis for treatment or other patient management decisions. Negative results must be combined with clinical observations, patient history, and epidemiological information. The expected result is Negative.  Fact Sheet for Patients: SugarRoll.be  Fact Sheet for Healthcare Providers: https://www.woods-mathews.com/  This test is not yet approved or cleared by the [place] FDA and  has been authorized for detection and/or diagnosis of SARS-CoV-2 by FDA under an Emergency Use Authorization (EUA). This EUA will remain  in effect (meaning this test can be used) for the duration of the COVID-19 declaration under Se ction 564(b)(1) of the Act, 21 U.S.C. section 360bbb-3(b)(1), unless the authorization is terminated or revoked sooner.  Performed at [facility], [place] [address]  [place], [place] [address]   SARS Coronavirus 2 by RT PCR (hospital order, performed in [facility] hospital lab) Nasopharyngeal Nasopharyngeal Swab     Status: None   Collection Time: 09/05/19  5:37 PM   Specimen: Nasopharyngeal Swab  Result Value Ref Range Status   SARS Coronavirus 2 NEGATIVE NEGATIVE Final    Comment: (NOTE) SARS-CoV-2 target nucleic acids are NOT DETECTED.  The SARS-CoV-2 RNA is generally detectable in upper and lower respiratory specimens during the acute phase of infection. The lowest concentration of SARS-CoV-2 viral copies this assay can detect is 250 copies / mL. A negative result does not preclude SARS-CoV-2 infection and should not be used as the sole basis for treatment or other patient management decisions.  A negative result may occur with improper specimen collection / handling, submission of specimen other than nasopharyngeal swab, presence of viral mutation(s) within the areas targeted by this assay, and inadequate number of viral copies (<250 copies / mL). A negative result must be combined with clinical observations, patient history, and epidemiological information.  Fact Sheet for Patients:   StrictlyIdeas.no  Fact Sheet for Healthcare Providers: BankingDealers.co.za  This test is not yet approved or  cleared by the [place] FDA and has been authorized for detection and/or diagnosis of SARS-CoV-2 by FDA under an Emergency Use Authorization (EUA).  This EUA will remain in effect (meaning this test can be used) for the duration of the COVID-19 declaration under Section 564(b)(1) of the Act, 21 U.S.C. section 360bbb-3(b)(1), unless the authorization is terminated or revoked sooner.  Performed at [facility], [place] [address], [place],  [address]       Studies: CT Angio Chest PE W and/or Wo Contrast  Addendum Date: 09/05/2019   ADDENDUM REPORT: 09/05/2019 17:14 ADDENDUM: These  results were called by telephone at the time of interpretation on 09/05/2019 at 5:14 pm to provider JOSHUA LONG , who verbally acknowledged these results. Electronically Signed   By: Constance Holster M.D.   On: 09/05/2019 17:14   Result Date: 09/05/2019 CLINICAL DATA:  PE suspected. EXAM: CT ANGIOGRAPHY CHEST WITH CONTRAST TECHNIQUE: Multidetector CT imaging of the chest was performed using the standard protocol during bolus administration of intravenous contrast. Multiplanar CT image reconstructions and MIPs were obtained to evaluate the vascular anatomy. CONTRAST:  56mL OMNIPAQUE IOHEXOL 350 MG/ML SOLN COMPARISON:  CT dated May 28 21 FINDINGS: Cardiovascular: Contrast injection is sufficient to demonstrate satisfactory opacification of the pulmonary arteries to the segmental level. There are acute bilateral lobar, segmental, and subsegmental pulmonary emboli. The size of the main pulmonary artery is normal. There is CT evidence for right-sided heart strain with an RV LV ratio measuring approximately 1. The heart is enlarged. There  is no significant pericardial effusion. There is reflux of contrast in the IVC. Mediastinum/Nodes: -- No mediastinal lymphadenopathy. -- No hilar lymphadenopathy. -- No axillary lymphadenopathy. -- No supraclavicular lymphadenopathy. -- Normal thyroid gland where visualized. -  Unremarkable esophagus. Lungs/Pleura: There is atelectasis at the lung bases. There is no pneumothorax. No significant pleural effusion. Upper Abdomen: Contrast bolus timing is not optimized for evaluation of the abdominal organs. There appears to be a nonobstructing stone in the upper pole the right kidney. Subcutaneous gas is noted along the patient's upper abdomen, presumably related to the patient's recent robotic assisted laparoscopic radical prostatectomy. Musculoskeletal: No chest wall abnormality. No bony spinal canal stenosis. Review of the MIP images confirms the above findings. IMPRESSION: 1. There  are acute bilateral lobar, segmental, and subsegmental pulmonary emboli. There is CT evidence for right-sided heart strain with an RV/LV ratio measuring approximately 1. 2. There is atelectasis at the lung bases. 3. Subcutaneous gas is noted along the patient's upper abdomen, presumably related to the patient's recent robotic assisted laparoscopic radical prostatectomy. 4. Cardiomegaly. Electronically Signed: By: Constance Holster M.D. On: 09/05/2019 17:05   DG Chest Port 1 View  Result Date: 09/05/2019 CLINICAL DATA:  Shortness of breath EXAM: PORTABLE CHEST 1 VIEW COMPARISON:  February 25, 2019 FINDINGS: The heart size and mediastinal contours are within normal limits. Both lungs are clear. The visualized skeletal structures are unremarkable. IMPRESSION: No active disease. Electronically Signed   By: Dorise Bullion III M.D   On: 09/05/2019 13:45   VAS [place] LOWER EXTREMITY VENOUS (DVT)  Result Date: 09/06/2019  Lower Venous DVT Study Indications: Pulmonary embolism, and Edema.  Risk Factors: Cancer Prostate S/P prostatectomy 09/01/19. Limitations: Bandages and Foley. Comparison Study: No prior study on file for comparison Performing Technologist: Sharion Dove RVS  Examination Guidelines: A complete evaluation includes B-mode imaging, spectral Doppler, color Doppler, and power Doppler as needed of all accessible portions of each vessel. Bilateral testing is considered an integral part of a complete examination. Limited examinations for reoccurring indications may be performed as noted. The reflux portion of the exam is performed with the patient in reverse Trendelenburg.  +---------+---------------+---------+-----------+----------+--------------+ RIGHT    CompressibilityPhasicitySpontaneityPropertiesThrombus Aging +---------+---------------+---------+-----------+----------+--------------+ CFV      Full           Yes      Yes                                  +---------+---------------+---------+-----------+----------+--------------+ SFJ      Full                                                        +---------+---------------+---------+-----------+----------+--------------+ FV Prox  Full                                                        +---------+---------------+---------+-----------+----------+--------------+ FV Mid   Full                                                        +---------+---------------+---------+-----------+----------+--------------+  FV Distal                                             Not visualized +---------+---------------+---------+-----------+----------+--------------+ PFV      Full                                                        +---------+---------------+---------+-----------+----------+--------------+ POP      Full           Yes      Yes                                 +---------+---------------+---------+-----------+----------+--------------+ PTV      Full                                                        +---------+---------------+---------+-----------+----------+--------------+ PERO     Full                                                        +---------+---------------+---------+-----------+----------+--------------+   +---------+---------------+---------+-----------+----------+--------------+ LEFT     CompressibilityPhasicitySpontaneityPropertiesThrombus Aging +---------+---------------+---------+-----------+----------+--------------+ CFV      Full           Yes      Yes                                 +---------+---------------+---------+-----------+----------+--------------+ SFJ      Full                                                        +---------+---------------+---------+-----------+----------+--------------+ FV Prox  Full                                                         +---------+---------------+---------+-----------+----------+--------------+ FV Mid   Full                                                        +---------+---------------+---------+-----------+----------+--------------+ FV DistalFull                                                        +---------+---------------+---------+-----------+----------+--------------+  PFV      Full                                                        +---------+---------------+---------+-----------+----------+--------------+ POP      Full           Yes      Yes                                 +---------+---------------+---------+-----------+----------+--------------+ PTV      Full                                                        +---------+---------------+---------+-----------+----------+--------------+ PERO     Full                                                        +---------+---------------+---------+-----------+----------+--------------+     Summary: RIGHT: - There is no evidence of deep vein thrombosis in the lower extremity. However, portions of this examination were limited- see technologist comments above.  LEFT: - There is no evidence of deep vein thrombosis in the lower extremity.  *See table(s) above for measurements and observations.    Preliminary     Scheduled Meds: . lisinopril  5 mg Oral Daily  . sodium chloride flush  3 mL Intravenous Q12H    Continuous Infusions: . heparin 1,200 Units/hr (09/06/19 0836)     LOS: 0 days     Alma Friendly, MD [facility]  If 7PM-7AM, please contact night-coverage [email or web address] 09/06/2019, 10:49 AM

## 2019-09-06 NOTE — ED Notes (Signed)
Pt transported to vascular lab. 

## 2019-09-06 NOTE — Progress Notes (Signed)
ANTICOAGULATION CONSULT NOTE - Initial Consult  Pharmacy Consult for heparin  Indication: pulmonary embolus  Allergies  Allergen Reactions   Amlodipine Swelling    BILATERAL FEET TO ANKLES    Patient Measurements: Height: 5\' 7"  (170.2 cm) Weight: 88.9 kg (195 lb 15.8 oz) IBW/kg (Calculated) : 66.1 Heparin Dosing Weight: 85kg  Vital Signs: BP: 136/88 (07/12 0830) Pulse Rate: 80 (07/12 0830)  Labs: Recent Labs    09/05/19 1419 09/05/19 1535 09/06/19 0000 09/06/19 0218 09/06/19 0832  HGB 12.4*  --   --  11.8*  --   HCT 39.4  --   --  37.0*  --   PLT 196  --   --  211  --   HEPARINUNFRC  --   --  0.92*  --  0.53  CREATININE 0.96  --   --  1.17  --   TROPONINIHS 19* 24*  --   --   --     Estimated Creatinine Clearance: 65.2 mL/min (by C-G formula based on SCr of 1.17 mg/dL).   Medical History: Past Medical History:  Diagnosis Date   Cancer [facility]    prostate   Hypertension     Assessment: 69 year old male presenting to [facility] post prostate surgery 7/7, presenting with chest tightness and sob. Afib w/rvr noted on arrival. CTA shows bilateral pulmonary embolus. New order received to start IV heparin. CBC drawn in ED wnl.   Heparin level therapeutic s/p rate change to 1200 units/hr  Goal of Therapy:  Heparin level 0.3-0.7 units/ml Monitor platelets by anticoagulation protocol: Yes   Plan:  Continue heparin gtt at 1200 units/hr Daily heparin level, CBC, s/s bleeding  Bertis Ruddy, PharmD Clinical Pharmacist ED Pharmacist Phone # [phone] 09/06/2019 10:33 AM

## 2019-09-06 NOTE — Progress Notes (Signed)
  Echocardiogram 2D Echocardiogram has been performed.  Tyler [name] 09/06/2019, 10:58 AM

## 2019-09-06 NOTE — Progress Notes (Signed)
ANTICOAGULATION CONSULT NOTE - Initial Consult  Pharmacy Consult for heparin  Indication: pulmonary embolus  Allergies  Allergen Reactions  . Amlodipine Swelling    BILATERAL FEET TO ANKLES    Patient Measurements: Height: 5\' 7"  (170.2 cm) Weight: 88.9 kg (195 lb 15.8 oz) IBW/kg (Calculated) : 66.1 Heparin Dosing Weight: 85kg  Vital Signs: BP: 129/93 (07/12 1308) Pulse Rate: 85 (07/12 1308)  Labs: Recent Labs    09/05/19 1419 09/05/19 1535 09/06/19 0000 09/06/19 0218 09/06/19 0832 09/06/19 1412  HGB 12.4*  --   --  11.8*  --   --   HCT 39.4  --   --  37.0*  --   --   PLT 196  --   --  211  --   --   HEPARINUNFRC  --   --  0.92*  --  0.53 0.73*  CREATININE 0.96  --   --  1.17  --   --   TROPONINIHS 19* 24*  --   --   --   --     Estimated Creatinine Clearance: 65.2 mL/min (by C-G formula based on SCr of 1.17 mg/dL).   Medical History: Past Medical History:  Diagnosis Date  . Cancer [facility])    prostate  . Hypertension     Assessment: 69 year old male presenting to [facility] post prostate surgery 7/7, presenting with chest tightness and sob. Afib w/rvr noted on arrival. CTA shows bilateral pulmonary embolus. New order received to start IV heparin. CBC drawn in ED wnl.    Goal of Therapy:  Heparin level 0.3-0.7 units/ml Monitor platelets by anticoagulation protocol: Yes   Plan:  - Heparin level slightly supra-therapeutic at 0.73 - Will slightly decrease heparin infusion to 1150 units/hr  - Will recheck HL in ~ 8 hours  - Daily heparin level, CBC, s/s bleeding  Duanne Limerick PharmD. BCPS  EM Clinical Pharmacist ED Pharmacist Phone # [phone]

## 2019-09-06 NOTE — Progress Notes (Signed)
ANTICOAGULATION CONSULT NOTE - Follow Up Consult  Pharmacy Consult for heparin Indication: atrial fibrillation and pulmonary embolus  Labs: Recent Labs    09/05/19 1419 09/05/19 1535 09/06/19 0000  HGB 12.4*  --   --   HCT 39.4  --   --   PLT 196  --   --   HEPARINUNFRC  --   --  0.92*  CREATININE 0.96  --   --   TROPONINIHS 19* 24*  --     Assessment: 69yo male supratherapeutic on heparin with initial dosing for PE and Afib (spontaneously converted to NSR, likely 2/2 PE); no gtt issues or signs of bleeding per RN.  Goal of Therapy:  Heparin level 0.3-0.7 units/ml   Plan:  Will decrease heparin gtt by 1-2 units/kg/hr to 1200 units/hr and check level in 6 hours.    Wynona Neat, PharmD, BCPS  09/06/2019,1:29 AM

## 2019-09-07 ENCOUNTER — Inpatient Hospital Stay (HOSPITAL_COMMUNITY): Payer: 59

## 2019-09-07 DIAGNOSIS — I9589 Other hypotension: Secondary | ICD-10-CM

## 2019-09-07 DIAGNOSIS — E861 Hypovolemia: Secondary | ICD-10-CM

## 2019-09-07 LAB — COMPREHENSIVE METABOLIC PANEL
ALT: 15 U/L (ref 0–44)
AST: 20 U/L (ref 15–41)
Albumin: 2.8 g/dL — ABNORMAL LOW (ref 3.5–5.0)
Alkaline Phosphatase: 60 U/L (ref 38–126)
Anion gap: 11 (ref 5–15)
BUN: 15 mg/dL (ref 8–23)
CO2: 25 mmol/L (ref 22–32)
Calcium: 7.8 mg/dL — ABNORMAL LOW (ref 8.9–10.3)
Chloride: 100 mmol/L (ref 98–111)
Creatinine, Ser: 1.52 mg/dL — ABNORMAL HIGH (ref 0.61–1.24)
GFR calc Af Amer: 54 mL/min — ABNORMAL LOW (ref >60)
GFR calc non Af Amer: 47 mL/min — ABNORMAL LOW (ref >60)
Glucose, Bld: 151 mg/dL — ABNORMAL HIGH (ref 70–99)
Potassium: 4.1 mmol/L (ref 3.5–5.1)
Sodium: 136 mmol/L (ref 135–145)
Total Bilirubin: 1.3 mg/dL — ABNORMAL HIGH (ref 0.3–1.2)
Total Protein: 5.6 g/dL — ABNORMAL LOW (ref 6.5–8.1)

## 2019-09-07 LAB — BASIC METABOLIC PANEL
Anion gap: 9 (ref 5–15)
BUN: 11 mg/dL (ref 8–23)
CO2: 24 mmol/L (ref 22–32)
Calcium: 8 mg/dL — ABNORMAL LOW (ref 8.9–10.3)
Chloride: 99 mmol/L (ref 98–111)
Creatinine, Ser: 0.96 mg/dL (ref 0.61–1.24)
GFR calc Af Amer: >60 mL/min (ref >60)
GFR calc non Af Amer: >60 mL/min (ref >60)
Glucose, Bld: 111 mg/dL — ABNORMAL HIGH (ref 70–99)
Potassium: 3.8 mmol/L (ref 3.5–5.1)
Sodium: 132 mmol/L — ABNORMAL LOW (ref 135–145)

## 2019-09-07 LAB — CBC
HCT: 32.4 % — ABNORMAL LOW (ref 39.0–52.0)
HCT: 28.9 % — ABNORMAL LOW (ref 39.0–52.0)
HCT: 29.4 % — ABNORMAL LOW (ref 39.0–52.0)
Hemoglobin: 10 g/dL — ABNORMAL LOW (ref 13.0–17.0)
Hemoglobin: 8.9 g/dL — ABNORMAL LOW (ref 13.0–17.0)
Hemoglobin: 9.3 g/dL — ABNORMAL LOW (ref 13.0–17.0)
MCH: 27 pg (ref 26.0–34.0)
MCH: 27.2 pg (ref 26.0–34.0)
MCH: 27.8 pg (ref 26.0–34.0)
MCHC: 30.9 g/dL (ref 30.0–36.0)
MCHC: 30.8 g/dL (ref 30.0–36.0)
MCHC: 31.6 g/dL (ref 30.0–36.0)
MCV: 87.6 fL (ref 80.0–100.0)
MCV: 88.4 fL (ref 80.0–100.0)
MCV: 87.8 fL (ref 80.0–100.0)
Platelets: 189 10*3/uL (ref 150–400)
Platelets: 184 10*3/uL (ref 150–400)
Platelets: 171 10*3/uL (ref 150–400)
RBC: 3.7 MIL/uL — ABNORMAL LOW (ref 4.22–5.81)
RBC: 3.27 MIL/uL — ABNORMAL LOW (ref 4.22–5.81)
RBC: 3.35 MIL/uL — ABNORMAL LOW (ref 4.22–5.81)
RDW: 13.9 % (ref 11.5–15.5)
RDW: 14 % (ref 11.5–15.5)
RDW: 14 % (ref 11.5–15.5)
WBC: 8 10*3/uL (ref 4.0–10.5)
WBC: 11.6 10*3/uL — ABNORMAL HIGH (ref 4.0–10.5)
WBC: 13.5 10*3/uL — ABNORMAL HIGH (ref 4.0–10.5)
nRBC: 0 % (ref 0.0–0.2)
nRBC: 0 % (ref 0.0–0.2)
nRBC: 0 % (ref 0.0–0.2)

## 2019-09-07 LAB — PROCALCITONIN: Procalcitonin: <0.1 ng/mL

## 2019-09-07 LAB — PREPARE RBC (CROSSMATCH)

## 2019-09-07 LAB — TROPONIN I (HIGH SENSITIVITY)
Troponin I (High Sensitivity): 6 ng/L (ref <18)
Troponin I (High Sensitivity): 6 ng/L (ref <18)

## 2019-09-07 LAB — LACTIC ACID, PLASMA
Lactic Acid, Venous: 2.5 mmol/L (ref 0.5–1.9)
Lactic Acid, Venous: 2.7 mmol/L (ref 0.5–1.9)

## 2019-09-07 LAB — HEPARIN LEVEL (UNFRACTIONATED): Heparin Unfractionated: 0.5 IU/mL (ref 0.30–0.70)

## 2019-09-07 MED ORDER — POLYETHYLENE GLYCOL 3350 17 G PO PACK
17.0000 g | PACK | Freq: Two times a day (BID) | ORAL | Status: DC
  Administered 2019-09-07 – 2019-09-11 (×6): 17 g via ORAL
  Filled 2019-09-07 (×9): qty 1

## 2019-09-07 MED ORDER — IPRATROPIUM-ALBUTEROL 0.5-2.5 (3) MG/3ML IN SOLN
3.0000 mL | Freq: Four times a day (QID) | RESPIRATORY_TRACT | Status: DC | PRN
  Administered 2019-09-07: 3 mL via RESPIRATORY_TRACT
  Filled 2019-09-07: qty 3

## 2019-09-07 MED ORDER — IOHEXOL 300 MG/ML  SOLN
100.0000 mL | Freq: Once | INTRAMUSCULAR | Status: AC | PRN
  Administered 2019-09-07: 100 mL via INTRAVENOUS

## 2019-09-07 MED ORDER — SORBITOL 70 % SOLN
960.0000 mL | TOPICAL_OIL | Freq: Once | ORAL | Status: AC
  Administered 2019-09-07: 960 mL via RECTAL
  Filled 2019-09-07: qty 473

## 2019-09-07 MED ORDER — CHLORHEXIDINE GLUCONATE CLOTH 2 % EX PADS
6.0000 | MEDICATED_PAD | Freq: Every day | CUTANEOUS | Status: DC
  Administered 2019-09-07 – 2019-09-11 (×5): 6 via TOPICAL

## 2019-09-07 MED ORDER — SODIUM CHLORIDE 0.9 % IV BOLUS
1000.0000 mL | Freq: Once | INTRAVENOUS | Status: AC
  Administered 2019-09-07: 1000 mL via INTRAVENOUS

## 2019-09-07 MED ORDER — OXYBUTYNIN CHLORIDE ER 5 MG PO TB24
5.0000 mg | ORAL_TABLET | Freq: Every day | ORAL | Status: DC
  Administered 2019-09-07 – 2019-09-10 (×4): 5 mg via ORAL
  Filled 2019-09-07 (×5): qty 1

## 2019-09-07 MED ORDER — SODIUM CHLORIDE 0.9% IV SOLUTION: Freq: Once | INTRAVENOUS | Status: AC

## 2019-09-07 MED ORDER — DM-GUAIFENESIN ER 30-600 MG PO TB12
1.0000 | ORAL_TABLET | Freq: Two times a day (BID) | ORAL | Status: DC
  Administered 2019-09-07 – 2019-09-11 (×9): 1 via ORAL
  Filled 2019-09-07 (×9): qty 1

## 2019-09-07 MED ORDER — IPRATROPIUM-ALBUTEROL 0.5-2.5 (3) MG/3ML IN SOLN: 3.0000 mL | Freq: Three times a day (TID) | RESPIRATORY_TRACT | Status: DC

## 2019-09-07 MED ORDER — SODIUM CHLORIDE 0.9 % IV SOLN: INTRAVENOUS | Status: DC

## 2019-09-07 MED ORDER — SODIUM CHLORIDE 0.9 % IV SOLN
2.0000 g | Freq: Two times a day (BID) | INTRAVENOUS | Status: DC
  Administered 2019-09-07: 2 g via INTRAVENOUS
  Filled 2019-09-07 (×2): qty 2

## 2019-09-07 MED ORDER — SODIUM CHLORIDE 0.9 % IV SOLN
1.0000 g | Freq: Two times a day (BID) | INTRAVENOUS | Status: DC
  Administered 2019-09-07: 1 g via INTRAVENOUS
  Filled 2019-09-07 (×2): qty 1

## 2019-09-07 NOTE — Progress Notes (Signed)
OT Cancellation Note  Patient Details Name: Tyler [name] MRN: [id number] DOB: 1950-03-05   Cancelled Treatment:    Reason Eval/Treat Not Completed: Medical issues which prohibited therapy (RN deferred 2/2 pt. with episode of emesis (unwitnessed).) RN states that patient was found covered in vomit this A.M. Will continue efforts toward completion of evaluation when patient is able to participate.   Gloris Manchester OTR/L Supplemental OT, Department of rehab services [phone]  Iysis Germain R H. 09/07/2019, 12:01 PM

## 2019-09-07 NOTE — Progress Notes (Signed)
PROGRESS NOTE  Tyler [name] PFX:[id number] DOB: 1950/03/23 DOA: 09/05/2019 PCP: [name], FNP  HPI/Recap of past 24 hours: HPI from Dr Dina Rich Mineer is a 69 y.o. male with medical history significant for prostate cancer s/p robotic assisted laparoscopic radical prostatectomy on 09/01/2019 and hypertension who presents to the ED for evaluation of chest pain and shortness of breath. Daughter provider history. Patient was hospitalized overnight and discharged on 09/02/2019 without any postop complication. On 09/05/2019, pt was walking outside when he suddenly became short of breath.  He had associated palpitations and some chest discomfort. Daughter called EMS for further evaluation. Patient was noted to be in atrial fibrillation with heart rate up to 160 before returning to normal sinus rhythm spontaneously. In ED, VS BP 114/80, pulse 105, RR 25, SPO2 97% on RA. Labs are notable for high-sensitivity troponin I 19 >> 24, otherwise stable. SARS-CoV-2 PCR negative. CTA chest PE study shows acute bilateral lobar, segmental, and subsegmental pulmonary emboli with CT evidence suggestive of right-sided heart strain with an RV/LV ratio measuring approximately 1. Patient was started on IV heparin and the hospitalist service was consulted to admit for further evaluation and management.      Today, patient reported unwitnessed syncopal episode while sitting in bed in his room.  Prior to this episode patient reports that he called the nurses station for help, but got none.  Michela Pitcher he became very diaphoretic, and the next thing he knows was he saw people around him.  Initial vital signs showed systolic blood pressure in the 70s to 80s, noted to be diaphoretic, but alert, awake, oriented.  Reported abdominal discomfort, CT with noted hematoma.  Stat labs showed decline in CBC.  Patient gave 1 bolus of IV fluids with maintenance at 125 cc/h.  Patient transfused with 1 unit of  PRBC.     Assessment/Plan: Principal Problem:   Bilateral pulmonary embolism (HCC) Active Problems:   Prostate cancer (HCC)   Hypertension   Hypokalemia   Paroxysmal atrial fibrillation (HCC)   Acute bilateral pulmonary embolism Currently saturating well on room air Troponin 19-->24, likely due to strain CTA chest showed acute bilateral lobar, segmental, and subsegmental pulmonary emboli with CT evidence suggestive of right-sided heart strain with an RV/LV ratio measuring approximately 1 Bilateral lower extremity Doppler negative for DVT although portions of the exam were limited Echo showed EF of 70 to 75%, with grade 1 diastolic dysfunction Concern for possible bleeding, held IV heparin for now, reevaluate on a daily basis due to bilateral PE Telemetry, monitor closely  Possible syncopal episode/hypotension Likely 2/2 hypovolemia 2/2 possible bleeding (recent surgery with hematuria), noted hematoma on imaging, r/o sepsis Noted hypotension, tachycardia this a.m. on 09/07/2019 Afebrile with mild leukocytosis Troponin negative, EKG with normal sinus rhythm Procalcitonin negative Lactic acid minimally elevated 2.7, will trend BC x2, NGTD, urine culture pending CT abdomen/pelvis showed small amount of hematoma in the anterior pelvis as well as extensive subcutaneous emphysema, small hematomas along the pelvic sidewalls versus vascular injuries or pseudoaneurysm Discussed with urology, Dr. Gloriann Loan on 09/07/2019, reported that results of CT is normally expected status post prostatectomy with pelvic node dissection, urology will see patient If symptoms continue to worsen, may recommend CTA abdomen/pelvis  Started on cefepime pending urine culture Continue IV fluids, monitor in progressive unit  Acute blood loss anemia Likely 2/2 postop blood loss in addition to IV Heparin Hemoglobin on admission around 12, currently now 8.9 IV Heparin held, reevaluate in the a.m. Transfuse  1 unit of  PRBC on 09/07/2019, CBC posttransfusion Monitor CBC  ??Paroxysmal A. Fib Currently heart rate controlled, in sinus rhythm Seen on EKG strip by EMS, spontaneously converted back to normal sinus rhythm prior to ED arrival EKG showed sinus rhythm Echo as above Telemetry  Hypertension BP soft  Held lisinopril  Prostate cancer status post robotic assisted laparoscopic radical prostatectomy and bilateral pelvic node dissection on 09/01/2019 Foley catheter in place, urologist Dr. [name] advised that hematuria is expected, but to watch out for retention since patient is going to be on anticoagulation Pain management Continue Foley catheter care Urology consulted, will see patient on 09/07/2019       Malnutrition Type:      Malnutrition Characteristics:      Nutrition Interventions:       Estimated body mass index is 30.13 kg/m as calculated from the following:   Height as of this encounter: 5\' 7"  (1.702 m).   Weight as of this encounter: 87.3 kg.     Code Status: Full  Family Communication: Discussed extensively with daughter on 09/07/2019  Disposition Plan: Status is: Inpatient  The patient will require care spanning > 2 midnights and should be moved to inpatient because: Inpatient level of care appropriate due to severity of illness  Dispo: The patient is from: Home              Anticipated d/c is to: Home              Anticipated d/c date is: 2 days              Patient currently is not medically stable to d/c.    Consultants:  Urology  Procedures:  None  Antimicrobials:  Cefepime  DVT prophylaxis: IV Heparin-held, plan to restart in a.m.   Objective: Vitals:   09/07/19 1230 09/07/19 1255 09/07/19 1437 09/07/19 1617  BP: 121/78 112/73  122/84  Pulse: 83 81 84 (!) 102  Resp: 18 19 20 18   Temp: 98.5 F (36.9 C) 98.7 F (37.1 C)  98.8 F (37.1 C)  TempSrc: Oral Oral  Oral  SpO2: 97% 98% 98% 98%  Weight:      Height:        Intake/Output  Summary (Last 24 hours) at 09/07/2019 1652 Last data filed at 09/07/2019 1630 Gross per 24 hour  Intake 1750.84 ml  Output 400 ml  Net 1350.84 ml   Filed Weights   09/05/19 1255 09/05/19 1323 09/07/19 0000  Weight: 88.9 kg 88.9 kg 87.3 kg    Exam:  General: NAD, acutely ill-appearing  Cardiovascular: S1, S2 present  Respiratory: CTAB  Abdomen: Soft, tender around laparoscopic incision sites, mildly distended, bowel sounds present  Musculoskeletal: No bilateral pedal edema noted, BLE chronic skin changes  Skin:  BLE chronic skin changes  Psychiatry: Normal mood    Data Reviewed: CBC: Recent Labs  Lab 09/02/19 0527 09/05/19 1419 09/06/19 0218 09/07/19 0111 09/07/19 0858  WBC  --  6.7 7.3 8.0 11.6*  HGB 11.6* 12.4* 11.8* 10.0* 8.9*  HCT 36.1* 39.4 37.0* 32.4* 28.9*  MCV  --  85.8 85.6 87.6 88.4  PLT  --  196 211 189 591   Basic Metabolic Panel: Recent Labs  Lab 09/02/19 0527 09/05/19 1419 09/06/19 0218 09/07/19 0111 09/07/19 0858  NA 138 139 139 132* 136  K 4.8 3.3* 3.9 3.8 4.1  CL 109 103 104 99 100  CO2 22 25 26 24 25   GLUCOSE 136* 129* 105* 111*  151*  BUN 13 10 11 11 15   CREATININE 1.11 0.96 1.17 0.96 1.52*  CALCIUM 8.2* 8.6* 8.6* 8.0* 7.8*  MG  --  1.7 1.7  --   --    GFR: Estimated Creatinine Clearance: 49.8 mL/min (A) (by C-G formula based on SCr of 1.52 mg/dL (H)). Liver Function Tests: Recent Labs  Lab 09/07/19 0858  AST 20  ALT 15  ALKPHOS 60  BILITOT 1.3*  PROT 5.6*  ALBUMIN 2.8*   No results for input(s): LIPASE, AMYLASE in the last 168 hours. No results for input(s): AMMONIA in the last 168 hours. Coagulation Profile: No results for input(s): INR, PROTIME in the last 168 hours. Cardiac Enzymes: No results for input(s): CKTOTAL, CKMB, CKMBINDEX, TROPONINI in the last 168 hours. BNP (last 3 results) No results for input(s): PROBNP in the last 8760 hours. HbA1C: No results for input(s): HGBA1C in the last 72 hours. CBG: No  results for input(s): GLUCAP in the last 168 hours. Lipid Profile: No results for input(s): CHOL, HDL, LDLCALC, TRIG, CHOLHDL, LDLDIRECT in the last 72 hours. Thyroid Function Tests: Recent Labs    09/05/19 1419  TSH 1.644   Anemia Panel: No results for input(s): VITAMINB12, FOLATE, FERRITIN, TIBC, IRON, RETICCTPCT in the last 72 hours. Urine analysis: No results found for: COLORURINE, APPEARANCEUR, LABSPEC, PHURINE, GLUCOSEU, HGBUR, BILIRUBINUR, KETONESUR, PROTEINUR, UROBILINOGEN, NITRITE, LEUKOCYTESUR Sepsis Labs: @LABRCNTIP (procalcitonin:4,lacticidven:4)  ) Recent Results (from the past 240 hour(s))  SARS Coronavirus 2 by RT PCR (hospital order, performed in [facility] hospital lab) Nasopharyngeal Nasopharyngeal Swab     Status: None   Collection Time: 09/05/19  5:37 PM   Specimen: Nasopharyngeal Swab  Result Value Ref Range Status   SARS Coronavirus 2 NEGATIVE NEGATIVE Final    Comment: (NOTE) SARS-CoV-2 target nucleic acids are NOT DETECTED.  The SARS-CoV-2 RNA is generally detectable in upper and lower respiratory specimens during the acute phase of infection. The lowest concentration of SARS-CoV-2 viral copies this assay can detect is 250 copies / mL. A negative result does not preclude SARS-CoV-2 infection and should not be used as the sole basis for treatment or other patient management decisions.  A negative result may occur with improper specimen collection / handling, submission of specimen other than nasopharyngeal swab, presence of viral mutation(s) within the areas targeted by this assay, and inadequate number of viral copies (<250 copies / mL). A negative result must be combined with clinical observations, patient history, and epidemiological information.  Fact Sheet for Patients:   StrictlyIdeas.no  Fact Sheet for Healthcare Providers: BankingDealers.co.za  This test is not yet approved or  cleared by the  [place] FDA and has been authorized for detection and/or diagnosis of SARS-CoV-2 by FDA under an Emergency Use Authorization (EUA).  This EUA will remain in effect (meaning this test can be used) for the duration of the COVID-19 declaration under Section 564(b)(1) of the Act, 21 U.S.C. section 360bbb-3(b)(1), unless the authorization is terminated or revoked sooner.  Performed at [facility], [place] [address], [place], [place] [address]   Culture, blood (routine x 2)     Status: None (Preliminary result)   Collection Time: 09/06/19  1:04 AM   Specimen: BLOOD RIGHT HAND  Result Value Ref Range Status   Specimen Description BLOOD RIGHT HAND  Final   Special Requests   Final    BOTTLES DRAWN AEROBIC AND ANAEROBIC Blood Culture results may not be optimal due to an excessive volume of blood received in  culture bottles   Culture   Final    NO GROWTH 1 DAY Performed at [facility], [place] [address], [place], [place] [address]    Report Status PENDING  Incomplete  Culture, blood (routine x 2)     Status: None (Preliminary result)   Collection Time: 09/06/19  2:18 AM   Specimen: BLOOD LEFT FOREARM  Result Value Ref Range Status   Specimen Description BLOOD LEFT FOREARM  Final   Special Requests   Final    BOTTLES DRAWN AEROBIC AND ANAEROBIC Blood Culture adequate volume   Culture   Final    NO GROWTH 1 DAY Performed at [facility], [employer] [address] [place], [place] [address]    Report Status PENDING  Incomplete      Studies: CT ABDOMEN PELVIS W CONTRAST  Result Date: 09/07/2019 CLINICAL DATA:  69 year old male with abdominal distension. Recent prostatectomy on 09/01/2019. Now with acute onset severe abdominal and flank pain. EXAM: CT ABDOMEN AND PELVIS WITH CONTRAST TECHNIQUE: Multidetector CT imaging of the abdomen and pelvis was performed using the standard protocol following bolus administration of intravenous contrast. CONTRAST:  155mL OMNIPAQUE IOHEXOL 300  MG/ML  SOLN COMPARISON:  CT abdomen pelvis dated 07/23/2019. FINDINGS: Lower chest: Minimal bibasilar atelectasis. The visualized lung bases are otherwise clear. Several small pockets of air along the anterior pelvic peritoneum, likely related to recent surgery. Diffuse pelvic stranding and small amount of free fluid within the pelvis as well as small perihepatic ascites. There is small amount of hematoma in the anterior pelvis measuring approximately 3 x 5 cm (70/3). Hepatobiliary: Several subcentimeter hepatic hypodense foci are too small to characterize. The liver is otherwise unremarkable. No intrahepatic biliary ductal dilatation. The gallbladder is unremarkable. Pancreas: Unremarkable. No pancreatic ductal dilatation or surrounding inflammatory changes. Spleen: Normal in size without focal abnormality. Adrenals/Urinary Tract: The adrenal glands are unremarkable. There is no hydronephrosis on either side. There is symmetric enhancement and excretion of contrast by both kidneys. Several small bilateral renal cysts. The visualized ureters appear unremarkable. The urinary bladder is decompressed around a Foley catheter. Stomach/Bowel: There is no bowel obstruction or active inflammation. The appendix is normal. Vascular/Lymphatic: The abdominal aorta and IVC are unremarkable. No portal venous gas. Several ovoid structures along the pelvic sidewalls and along the course of the external iliac vessels noted measuring up to 2.7 x 2.7 cm on the left (70/3). These appear to have layering high attenuating content or contrast material and suspicious for a small hematomas although vascular injuries or pseudoaneurysm are not excluded. Follow-up recommended. There is no adenopathy. Reproductive: Prostatectomy. The seminal vesicles are unremarkable. Other: There is extensive subcutaneous emphysema extending from the scrotal wall to the anterior pelvic wall and anterior abdominal wall. Musculoskeletal: No acute or significant  osseous findings. IMPRESSION: 1. Status post prostatectomy. Small amount of hematoma in the anterior pelvis as well as extensive subcutaneous emphysema. 2. Several ovoid structures along the pelvic sidewalls and along the course of the external iliac vessels suspicious for a small hematomas versus vascular injuries or pseudoaneurysm. Follow-up recommended. 3. No bowel obstruction. Normal appendix. Electronically Signed   By: Anner Crete M.D.   On: 09/07/2019 02:26   DG Chest Port 1 View  Result Date: 09/07/2019 CLINICAL DATA:  Dyspnea. EXAM: PORTABLE CHEST 1 VIEW COMPARISON:  September 05, 2019. FINDINGS: The heart size and mediastinal contours are within normal limits. Both lungs are clear. No pneumothorax or pleural effusion is noted. The visualized skeletal structures  are unremarkable. IMPRESSION: No active disease. Electronically Signed   By: Marijo Conception M.D.   On: 09/07/2019 08:49    Scheduled Meds: . Chlorhexidine Gluconate Cloth  6 each Topical Daily  . dextromethorphan-guaiFENesin  1 tablet Oral BID  . polyethylene glycol  17 g Oral BID  . senna-docusate  1 tablet Oral BID  . sodium chloride flush  3 mL Intravenous Q12H    Continuous Infusions: . sodium chloride 125 mL/hr at 09/07/19 1248  . [START ON 09/08/2019] ceFEPime (MAXIPIME) IV       LOS: 1 day     Alma Friendly, MD [facility]  If 7PM-7AM, please contact night-coverage [email or web address] 09/07/2019, 4:52 PM

## 2019-09-07 NOTE — Consult Note (Signed)
H&P Physician requesting consult: Lesia Sago  Chief Complaint: Recent prostatectomy, bilateral pulmonary emboli, gross hematuria and abdominal pain, acute blood loss anemia  History of Present Illness: This is a 69 year old male with unfavorable intermediate risk prostate cancer who therefore underwent robotic assisted laparoscopic radical prostatectomy with bilateral pelvic lymph node dissection on 09/01/2019.  Postoperative course was unremarkable.  However, he presented to the emergency department on 09/05/2019 with chest pain shortness of breath.  He was found to have atrial fibrillation.  CTA chest PE study showed acute bilateral lobar, segmental, and subsegmental pulmonary emboli with CT findings suggestive of right-sided heart strain.  The patient was started on IV heparin.  Today, he was noted to have a unwitnessed syncopal episode and was also diaphoretic.  He had low systolic blood pressures.  He was given an IV fluid bolus and 1 unit of PRBC.  Hemoglobin on admission was 12.4.  Over the past couple of days, it has slowly trended down most recent hemoglobin was 8.9 prior to transfusion.  Given the symptomatology he was given transfusion and posttransfusion hemoglobin was 9.3.  He does have a mildly elevated white blood cell count of 13.5.  Lactate was 2.7.  He was started on cefepime.  He has been afebrile.  Vital signs have improved resuscitation.  Given abdominal pain and d distention, he underwent a CT scan of the abdomen and pelvis with contrast.  This revealed small amount of hematoma in the anterior pelvis and extensive subcutaneous emphysema consistent with his previous and recent prostatectomy.  There were also several ovoid structures along the pelvic sidewalls suspicious for small hematoma versus pseudoaneurysm.  In my opinion, since he had a pelvic lymph node dissection, this likely represents a lymphocele versus small hematoma.  Patient states that overall he is doing okay.  He is  having some generalized abdominal pain.  His abdomen does feel more distended to both him and his daughter.  His daughter is a Designer, jewellery.  He was having a fever with hematuria but this is improving since taking him off the heparin.  He does complain about intermittent severe abdominal pain with urinary leakage around his catheter.  This consistent with intermittent bladder spasms from his Foley catheter.  He was unable to have a bowel movement and therefore he had an enema.  He then had a productive bowel pain.  He states that he is having flatus.  He apparently did have an episode of emesis earlier today and is not taking in a lot of p.o. but he is having nausea  Past Medical History:  Diagnosis Date  . Cancer [id number])    prostate  . Hypertension    Past Surgical History:  Procedure Laterality Date  . LYMPHADENECTOMY Bilateral 09/01/2019   Procedure: LYMPHADENECTOMY;  Surgeon: Alexis Frock, MD;  Location: [facility] ORS;  Service: Urology;  Laterality: Bilateral;  . NO PAST SURGERIES    . ROBOT ASSISTED LAPAROSCOPIC RADICAL PROSTATECTOMY N/A 09/01/2019   Procedure: XI ROBOTIC ASSISTED LAPAROSCOPIC RADICAL PROSTATECTOMY;  Surgeon: Alexis Frock, MD;  Location: [facility];  Service: Urology;  Laterality: N/A;  3 HRS    Home Medications:  Medications Prior to Admission  Medication Sig Dispense Refill Last Dose  . lisinopril (ZESTRIL) 5 MG tablet Take 5 mg by mouth daily.   09/05/2019 at am  . oxyCODONE-acetaminophen (PERCOCET) 5-325 MG tablet Take 1 tablet by mouth every 6 (six) hours as needed for moderate pain or severe pain. Post-operatively. 15 tablet 0 09/05/2019 at am  .  senna-docusate (SENOKOT-S) 8.6-50 MG tablet Take 1 tablet by mouth 2 (two) times daily. While taking strong pain meds to prevent constipation. 10 tablet 0 09/05/2019 at am  . sulfamethoxazole-trimethoprim (BACTRIM DS) 800-160 MG tablet Take 1 tablet by mouth 2 (two) times daily. X 3 days. Begin day before next Urology appointment.  (Patient taking differently: Take 1 tablet by mouth See admin instructions. Take one tablet by mouth twice daily for 3 days.  Begin day before next Urology appointment that is scheduled for 09/13/2019.) 6 tablet 0 not yet taken   Allergies:  Allergies  Allergen Reactions  . Amlodipine Swelling    BILATERAL FEET TO ANKLES    Family History  Problem Relation Age of Onset  . Stroke Father    Social History:  reports that he has never smoked. He has never used smokeless tobacco. He reports previous alcohol use. He reports that he does not use drugs.  ROS: A complete review of systems was performed.  All systems are negative except for pertinent findings as noted. ROS   Physical Exam:  Vital signs in last 24 hours: Temp:  [98 F (36.7 C)-99.5 F (37.5 C)] 99.5 F (37.5 C) (07/13 1942) Pulse Rate:  [78-102] 97 (07/13 1942) Resp:  [18-20] 18 (07/13 1942) BP: (74-149)/(33-87) 149/78 (07/13 1942) SpO2:  [90 %-100 %] 90 % (07/13 1942) Weight:  [87.3 kg] 87.3 kg (07/13 0000) General:  Alert and oriented, No acute distress HEENT: Normocephalic, atraumatic Neck: No JVD or lymphadenopathy Cardiovascular: Regular rate and rhythm Lungs: Regular rate and effort Abdomen:.  There is mild tenderness to palpation.  No evidence of any superficial infection. Back: No CVA tenderness Extremities: No edema Neurologic: Grossly intact  Laboratory Data:  Results for orders placed or performed during the hospital encounter of 09/05/19 (from the past 24 hour(s))  Heparin level (unfractionated)     Status: None   Collection Time: 09/07/19  1:11 AM  Result Value Ref Range   Heparin Unfractionated 0.50 0.30 - 0.70 IU/mL  CBC     Status: Abnormal   Collection Time: 09/07/19  1:11 AM  Result Value Ref Range   WBC 8.0 4.0 - 10.5 K/uL   RBC 3.70 (L) 4.22 - 5.81 MIL/uL   Hemoglobin 10.0 (L) 13.0 - 17.0 g/dL   HCT 32.4 (L) 39 - 52 %   MCV 87.6 80.0 - 100.0 fL   MCH 27.0 26.0 - 34.0 pg   MCHC 30.9 30.0  - 36.0 g/dL   RDW 13.9 11.5 - 15.5 %   Platelets 189 150 - 400 K/uL   nRBC 0.0 0.0 - 0.2 %  Basic metabolic panel     Status: Abnormal   Collection Time: 09/07/19  1:11 AM  Result Value Ref Range   Sodium 132 (L) 135 - 145 mmol/L   Potassium 3.8 3.5 - 5.1 mmol/L   Chloride 99 98 - 111 mmol/L   CO2 24 22 - 32 mmol/L   Glucose, Bld 111 (H) 70 - 99 mg/dL   BUN 11 8 - 23 mg/dL   Creatinine, Ser 0.96 0.61 - 1.24 mg/dL   Calcium 8.0 (L) 8.9 - 10.3 mg/dL   GFR calc non Af Amer >60 >60 mL/min   GFR calc Af Amer >60 >60 mL/min   Anion gap 9 5 - 15  CBC     Status: Abnormal   Collection Time: 09/07/19  8:58 AM  Result Value Ref Range   WBC 11.6 (H) 4.0 - 10.5 K/uL  RBC 3.27 (L) 4.22 - 5.81 MIL/uL   Hemoglobin 8.9 (L) 13.0 - 17.0 g/dL   HCT 28.9 (L) 39 - 52 %   MCV 88.4 80.0 - 100.0 fL   MCH 27.2 26.0 - 34.0 pg   MCHC 30.8 30.0 - 36.0 g/dL   RDW 14.0 11.5 - 15.5 %   Platelets 184 150 - 400 K/uL   nRBC 0.0 0.0 - 0.2 %  Lactic acid, plasma     Status: Abnormal   Collection Time: 09/07/19  8:58 AM  Result Value Ref Range   Lactic Acid, Venous 2.5 (HH) 0.5 - 1.9 mmol/L  Type and screen [place]     Status: None (Preliminary result)   Collection Time: 09/07/19  8:58 AM  Result Value Ref Range   ABO/RH(D) O POS    Antibody Screen NEG    Sample Expiration 09/10/2019,2359    Unit Number [id number]    Blood Component Type RED CELLS,LR    Unit division 00    Status of Unit ISSUED    Transfusion Status OK TO TRANSFUSE    Crossmatch Result      Compatible Performed at [facility], [address] [place], [place] [address]   Comprehensive metabolic panel     Status: Abnormal   Collection Time: 09/07/19  8:58 AM  Result Value Ref Range   Sodium 136 135 - 145 mmol/L   Potassium 4.1 3.5 - 5.1 mmol/L   Chloride 100 98 - 111 mmol/L   CO2 25 22 - 32 mmol/L   Glucose, Bld 151 (H) 70 - 99 mg/dL   BUN 15 8 - 23 mg/dL   Creatinine, Ser 1.52 (H) 0.61 - 1.24  mg/dL   Calcium 7.8 (L) 8.9 - 10.3 mg/dL   Total Protein 5.6 (L) 6.5 - 8.1 g/dL   Albumin 2.8 (L) 3.5 - 5.0 g/dL   AST 20 15 - 41 U/L   ALT 15 0 - 44 U/L   Alkaline Phosphatase 60 38 - 126 U/L   Total Bilirubin 1.3 (H) 0.3 - 1.2 mg/dL   GFR calc non Af Amer 47 (L) >60 mL/min   GFR calc Af Amer 54 (L) >60 mL/min   Anion gap 11 5 - 15  Procalcitonin - Baseline     Status: None   Collection Time: 09/07/19  8:58 AM  Result Value Ref Range   Procalcitonin <0.10 ng/mL  Troponin I (High Sensitivity)     Status: None   Collection Time: 09/07/19  8:58 AM  Result Value Ref Range   Troponin I (High Sensitivity) 6 <18 ng/L  Prepare RBC (crossmatch)     Status: None   Collection Time: 09/07/19 10:30 AM  Result Value Ref Range   Order Confirmation      ORDER PROCESSED BY BLOOD BANK Performed at [facility], [address], [place], [place] [address]   Lactic acid, plasma     Status: Abnormal   Collection Time: 09/07/19 11:47 AM  Result Value Ref Range   Lactic Acid, Venous 2.7 (HH) 0.5 - 1.9 mmol/L  Troponin I (High Sensitivity)     Status: None   Collection Time: 09/07/19 11:47 AM  Result Value Ref Range   Troponin I (High Sensitivity) 6 <18 ng/L  CBC     Status: Abnormal   Collection Time: 09/07/19  5:21 PM  Result Value Ref Range   WBC 13.5 (H) 4.0 - 10.5 K/uL   RBC 3.35 (L)  4.22 - 5.81 MIL/uL   Hemoglobin 9.3 (L) 13.0 - 17.0 g/dL   HCT 29.4 (L) 39 - 52 %   MCV 87.8 80.0 - 100.0 fL   MCH 27.8 26.0 - 34.0 pg   MCHC 31.6 30.0 - 36.0 g/dL   RDW 14.0 11.5 - 15.5 %   Platelets 171 150 - 400 K/uL   nRBC 0.0 0.0 - 0.2 %   Recent Results (from the past 240 hour(s))  SARS Coronavirus 2 by RT PCR (hospital order, performed in [facility] hospital lab) Nasopharyngeal Nasopharyngeal Swab     Status: None   Collection Time: 09/05/19  5:37 PM   Specimen: Nasopharyngeal Swab  Result Value Ref Range Status   SARS Coronavirus 2 NEGATIVE NEGATIVE Final    Comment: (NOTE) SARS-CoV-2  target nucleic acids are NOT DETECTED.  The SARS-CoV-2 RNA is generally detectable in upper and lower respiratory specimens during the acute phase of infection. The lowest concentration of SARS-CoV-2 viral copies this assay can detect is 250 copies / mL. A negative result does not preclude SARS-CoV-2 infection and should not be used as the sole basis for treatment or other patient management decisions.  A negative result may occur with improper specimen collection / handling, submission of specimen other than nasopharyngeal swab, presence of viral mutation(s) within the areas targeted by this assay, and inadequate number of viral copies (<250 copies / mL). A negative result must be combined with clinical observations, patient history, and epidemiological information.  Fact Sheet for Patients:   StrictlyIdeas.no  Fact Sheet for Healthcare Providers: BankingDealers.co.za  This test is not yet approved or  cleared by the [place] FDA and has been authorized for detection and/or diagnosis of SARS-CoV-2 by FDA under an Emergency Use Authorization (EUA).  This EUA will remain in effect (meaning this test can be used) for the duration of the COVID-19 declaration under Section 564(b)(1) of the Act, 21 U.S.C. section 360bbb-3(b)(1), unless the authorization is terminated or revoked sooner.  Performed at [facility], [place] [address] [place], [place] [address]   Culture, blood (routine x 2)     Status: None (Preliminary result)   Collection Time: 09/06/19  1:04 AM   Specimen: BLOOD RIGHT HAND  Result Value Ref Range Status   Specimen Description BLOOD RIGHT HAND  Final   Special Requests   Final    BOTTLES DRAWN AEROBIC AND ANAEROBIC Blood Culture results may not be optimal due to an excessive volume of blood received in culture bottles   Culture   Final    NO GROWTH 1 DAY Performed at [facility], [place] [address]  [place], [place] [address]    Report Status PENDING  Incomplete  Culture, blood (routine x 2)     Status: None (Preliminary result)   Collection Time: 09/06/19  2:18 AM   Specimen: BLOOD LEFT FOREARM  Result Value Ref Range Status   Specimen Description BLOOD LEFT FOREARM  Final   Special Requests   Final    BOTTLES DRAWN AEROBIC AND ANAEROBIC Blood Culture adequate volume   Culture   Final    NO GROWTH 1 DAY Performed at [facility], [place] [address], [place], [place] [address]    Report Status PENDING  Incomplete   Creatinine: Recent Labs    09/02/19 0527 09/05/19 1419 09/06/19 0218 09/07/19 0111 09/07/19 0858  CREATININE 1.11 0.96 1.17 0.96 1.52*   CT scan personally reviewed and is detailed in history  of present illness  Impression/Assessment:  Bilateral pulmonary emboli Acute blood loss anemia Generalized abdominal pain  Plan:  I will change his diet to clear liquids given his abdominal distention and recent vomiting today.  Slowly advance.  Given his bladder spasms, I added oxybutynin XL 5 mg.  Agree with holding heparin for now.  If he has a sudden drop in hemoglobin, would recommend a CTA of the abdomen but I have a low suspicion for arterial bleeding at this point.  CT scan did not show an obvious large fluid collection to suggest a large amount of hemorrhage.  Continue Foley catheter.  I will talk to Dr. [name] about this patient tomorrow.  Marton Redwood, III 09/07/2019, 9:25 PM

## 2019-09-07 NOTE — Evaluation (Signed)
Occupational Therapy Evaluation Patient Details Name: Tyler [name] MRN: [id number] DOB: Nov 05, 1950 Today's Date: 09/07/2019    History of Present Illness He is s/p prostatectomy on 7/7, on IV heparin for PE, and developed acute pain in lower abd and b/l flanks this evening.  Hematoma bil groin.    Clinical Impression   PTA patient was living with family in a private residence. Prior to prostatectomy on 7/7, patient was independent with all BADLs. Since surgery, patient has required assistance with BADLs including bathing, dressing, and toileting. Patient currently presents below baseline level of function requiring grossly Min to Mod A for LB BADLs including toileting/hygiene/clothing management. Patient also limited by generalized weakness, decreased static/dynamic standing balance, and pain with inability to sit EOB for more than a few minutes. Patient would benefit from continued acute OT services to increase safety and independence with self-care tasks in prep for d/c home.     Follow Up Recommendations  Home health OT;Supervision/Assistance - 24 hour    Equipment Recommendations  3 in 1 bedside commode    Recommendations for Other Services       Precautions / Restrictions Precautions Precautions: Fall Restrictions Weight Bearing Restrictions: No      Mobility Bed Mobility Overal bed mobility: Needs Assistance Bed Mobility: Rolling;Sidelying to Sit Rolling: Min guard Sidelying to sit: Min guard       General bed mobility comments: Increased time/effort 2/2 pain.   Transfers Overall transfer level: Needs assistance Equipment used: Rolling walker (2 wheeled) Transfers: Sit to/from Stand Sit to Stand: Min guard         General transfer comment: Cues for hand placement and safety with RW.     Balance Overall balance assessment: Needs assistance Sitting-balance support: No upper extremity supported;Feet supported Sitting balance-Leahy Scale: Fair     Standing  balance support: Bilateral upper extremity supported;During functional activity Standing balance-Leahy Scale: Poor Standing balance comment: Heavy reliance on BUE w/ RW                           ADL either performed or assessed with clinical judgement   ADL Overall ADL's : Needs assistance/impaired Eating/Feeding: Set up;Sitting   Grooming: Set up;Sitting                   Toilet Transfer: Minimal assistance;BSC;RW Toilet Transfer Details (indicate cue type and reason): Decreased safety awareness likely compensating due to pain.  Toileting- Clothing Manipulation and Hygiene: Minimal assistance;Sit to/from stand Toileting - Clothing Manipulation Details (indicate cue type and reason): Decreased safety awareness requiring cues for sequencing.      Functional mobility during ADLs: Minimal assistance;Rolling walker;Cueing for safety       Vision Baseline Vision/History: Wears glasses Patient Visual Report: No change from baseline Vision Assessment?: No apparent visual deficits     Perception     Praxis      Pertinent Vitals/Pain Pain Assessment: Faces Faces Pain Scale: Hurts whole lot Pain Location: bil groin Pain Descriptors / Indicators: Aching;Grimacing;Guarding Pain Intervention(s): Limited activity within patient's tolerance;Monitored during session;Repositioned     Hand Dominance Left   Extremity/Trunk Assessment Upper Extremity Assessment Upper Extremity Assessment: Generalized weakness   Lower Extremity Assessment Lower Extremity Assessment: Defer to PT evaluation   Cervical / Trunk Assessment Cervical / Trunk Assessment: Kyphotic   Communication Communication Communication: No difficulties   Cognition Arousal/Alertness: Awake/alert Behavior During Therapy: WFL for tasks assessed/performed Overall Cognitive Status: Difficult to assess  General Comments: Patient prefers language other than  [place]. Granddaughter at bedside to translate.    General Comments  Granddaughter present at bedside for interpretation.     Exercises     Shoulder Instructions      Home Living Family/patient expects to be discharged to:: Private residence Living Arrangements: Children;Other relatives Available Help at Discharge: Family;Available PRN/intermittently (Daughter works Games developer and Physiological scientist, wife going to work 7/19) Type of Home: House Home Access: Stairs to enter [employer] of Steps: 1 Entrance Stairs-Rails: None Home Layout: Two level;Able to live on main level with bedroom/bathroom     Bathroom Shower/Tub: Occupational psychologist: Standard     Home Equipment: Grab bars - tub/shower;Walker - 2 wheels;Cane - single point;Shower seat - built in;Hand held shower head   Additional Comments: was sleeping in recliner since surgery      Prior Functioning/Environment Level of Independence: Needs assistance  Gait / Transfers Assistance Needed: Use of RW 2 days prior to admission. No DME prior to surgery.  ADL's / Homemaking Assistance Needed: Independent prior to surgery. Assist with bathing/dressing/toileting since surgery.    Comments: Was walking 7 miles a day prior to surgery.  Doesnt drive        OT Problem List: Decreased strength;Decreased activity tolerance;Impaired balance (sitting and/or standing);Decreased safety awareness;Decreased knowledge of use of DME or AE;Pain      OT Treatment/Interventions: Self-care/ADL training;Energy conservation;DME and/or AE instruction;Therapeutic activities;Patient/family education;Balance training    OT Goals(Current goals can be found in the care plan section) Acute Rehab OT Goals Patient Stated Goal: to get better and go home OT Goal Formulation: With patient/family Time For Goal Achievement: 09/21/19 Potential to Achieve Goals: Good ADL Goals Pt Will Perform Grooming: with supervision;standing Pt Will Perform  Lower Body Bathing: with supervision;with adaptive equipment;sit to/from stand;sitting/lateral leans Pt Will Perform Lower Body Dressing: with supervision;sitting/lateral leans;sit to/from stand;with adaptive equipment Pt Will Transfer to Toilet: with supervision;ambulating;bedside commode Pt Will Perform Toileting - Clothing Manipulation and hygiene: with supervision;sitting/lateral leans;sit to/from stand  OT Frequency: Min 2X/week   Barriers to D/C:            Co-evaluation              AM-PAC OT "6 Clicks" Daily Activity     Outcome Measure Help from another person eating meals?: A Little Help from another person taking care of personal grooming?: A Little Help from another person toileting, which includes using toliet, bedpan, or urinal?: A Lot Help from another person bathing (including washing, rinsing, drying)?: A Lot Help from another person to put on and taking off regular upper body clothing?: A Little Help from another person to put on and taking off regular lower body clothing?: A Lot 6 Click Score: 15   End of Session Equipment Utilized During Treatment: Gait belt;Rolling walker  Activity Tolerance: Patient limited by pain Patient left: in bed;with call bell/phone within reach;with bed alarm set  OT Visit Diagnosis: Unsteadiness on feet (R26.81);Muscle weakness (generalized) (M62.81);Pain                Time: 1345-1412 OT Time Calculation (min): 27 min Charges:  OT General Charges $OT Visit: 1 Visit OT Evaluation $OT Eval Moderate Complexity: 1 Mod OT Treatments $Self Care/Home Management : 8-22 mins  Deonta Bomberger H. OTR/L Supplemental OT, Department of rehab services [phone]  Halyn Flaugher R H. 09/07/2019, 3:24 PM

## 2019-09-07 NOTE — Progress Notes (Addendum)
[place] for heparin  Indication: pulmonary embolus  Assessment: 69 year old male presenting to [facility] post prostate surgery 7/7, presenting with chest tightness and sob. Afib w/rvr noted on arrival. CTA shows bilateral pulmonary embolus. New order received to start IV heparin. CBC drawn in ED wnl.  Heparin level 0.50 units/ml   Goal of Therapy:  Heparin level 0.3-0.7 units/ml Monitor platelets by anticoagulation protocol: Yes   Plan:  - Continue heparin infusion at 1150 units/hr  - Will recheck HL in 6-8 hours  - Daily heparin level, CBC, s/s bleeding  Thanks for allowing pharmacy to be a part of this patient's care.  Excell Seltzer, PharmD Clinical Pharmacist   AM: Heparin now on hold   Thank you Anette Guarneri, PharmD

## 2019-09-07 NOTE — TOC Benefit Eligibility Note (Signed)
Transition of Care [facility]) Benefit Eligibility Note    Patient Details  Name: Tyler [name] MRN: [id number] Date of Birth: March 12, 1950   Medication/Dose: Eliquis  Covered?: Yes     Prescription Coverage Preferred Pharmacy: can use any pharmacy  Spoke with Person/Company/Phone Number:: [facility] [employer] [phone]  Co-Pay: estimated co-pay is $0.55 for 30 day retail  Prior Approval: No          [place] Phone Number: 09/07/2019, 4:29 PM

## 2019-09-07 NOTE — Consult Note (Addendum)
WOC consult requested to assess surgical incision from 7/7; this was previously performed by Urology. Discussed with primary team and they decided to cancel the [facility] consult and plan to request a Urology team consult to assess the post-op site. Please re-consult if further assistance is needed.  Thank-you,  Julien Girt MSN, [place], [place], [place], [place]

## 2019-09-07 NOTE — Evaluation (Addendum)
Physical Therapy Evaluation Patient Details Name: Tyler [name] MRN: [id number] DOB: 1951/01/04 Today's Date: 09/07/2019   History of Present Illness  He is s/p prostatectomy on 7/7, on IV heparin for PE, and developed acute pain in lower abd and b/l flanks this evening.  Hematoma bil groin.   Clinical Impression  Pt admitted with above diagnosis. Pt was able to ambulate with RW short distance due to bil groin pain. Pt should progress well.  Daughter present and states that he was active prior to surgery. Feel that pt would benefit from HHPT and HHOT to return to prior functional level. Will need 3N1 as well due to bil groin pain.  Pt currently with functional limitations due to the deficits listed below (see PT Problem List). Pt will benefit from skilled PT to increase their independence and safety with mobility to allow discharge to the venue listed below.    Orthostatic BPs  Supine NT- did not c/o dizziness until sitting  Sitting 118/82, 74 bpm  Standing 95/57, 69 bpm  Standing after 3 min 103/80, 94 bpm    Follow Up Recommendations Home health PT;Supervision/Assistance - 24 hour (HHOT)    Equipment Recommendations  3in1 (PT)    Recommendations for Other Services       Precautions / Restrictions Precautions Precautions: Fall Restrictions Weight Bearing Restrictions: No      Mobility  Bed Mobility Overal bed mobility: Needs Assistance Bed Mobility: Rolling;Sidelying to Sit Rolling: Min guard Sidelying to sit: Min guard       General bed mobility comments: incr time needed.   Transfers Overall transfer level: Needs assistance Equipment used: Rolling walker (2 wheeled) Transfers: Sit to/from Stand Sit to Stand: Min guard         General transfer comment: cues needed for hand placement  Ambulation/Gait Ambulation/Gait assistance: Min guard Gait Distance (Feet): 35 Feet Assistive device: Rolling walker (2 wheeled) Gait Pattern/deviations: Step-through  pattern;Decreased stride length;Trunk flexed;Wide base of support;Drifts right/left   Gait velocity interpretation: <1.31 ft/sec, indicative of household ambulator General Gait Details: Pt dizzy with slight orthostatic BPS.  Was able to walk a short distance in room.   Stairs            Wheelchair Mobility    Modified Rankin (Stroke Patients Only)       Balance Overall balance assessment: Needs assistance Sitting-balance support: No upper extremity supported;Feet supported Sitting balance-Leahy Scale: Fair     Standing balance support: Bilateral upper extremity supported;During functional activity Standing balance-Leahy Scale: Poor Standing balance comment: relies on RW for support currently                             Pertinent Vitals/Pain Pain Assessment: Faces Faces Pain Scale: Hurts whole lot Pain Location: bil groin Pain Descriptors / Indicators: Aching;Grimacing;Guarding Pain Intervention(s): Limited activity within patient's tolerance;Monitored during session;Repositioned    Home Living Family/patient expects to be discharged to:: Private residence Living Arrangements: Children;Other relatives Available Help at Discharge: Family;Available PRN/intermittently (Daughter works Games developer and Physiological scientist, wife going to work 7/19) Type of Home: House Home Access: Stairs to enter Entrance Stairs-Rails: None Technical brewer of Steps: 1 Home Layout: Two level;Able to live on main level with bedroom/bathroom Home Equipment: Shower seat;Grab bars - tub/shower;Walker - 2 wheels;Cane - single point Additional Comments: was sleeping in recliner since surgery    Prior Function Level of Independence: Independent with assistive device(s)         Comments:  HAs been using RW since surgery and has needed assist with bathing and dressing.  Was walking 7 miles a day prior to surgery.  Doesnt drive     Hand Dominance   Dominant Hand: Left    Extremity/Trunk  Assessment   Upper Extremity Assessment Upper Extremity Assessment: Defer to OT evaluation    Lower Extremity Assessment Lower Extremity Assessment: Generalized weakness    Cervical / Trunk Assessment Cervical / Trunk Assessment: Kyphotic  Communication   Communication: No difficulties  Cognition Arousal/Alertness: Awake/alert Behavior During Therapy: WFL for tasks assessed/performed Overall Cognitive Status: Within Functional Limits for tasks assessed                                        General Comments      Exercises     Assessment/Plan    PT Assessment Patient needs continued PT services  PT Problem List Decreased activity tolerance;Decreased mobility;Decreased balance;Decreased knowledge of use of DME;Decreased safety awareness;Decreased knowledge of precautions;Pain       PT Treatment Interventions DME instruction;Gait training;Functional mobility training;Therapeutic activities;Therapeutic exercise;Balance training;Patient/family education;Stair training    PT Goals (Current goals can be found in the Care Plan section)  Acute Rehab PT Goals Patient Stated Goal: to get better and go home PT Goal Formulation: With patient Time For Goal Achievement: 09/21/19 Potential to Achieve Goals: Good    Frequency Min 3X/week   Barriers to discharge        Co-evaluation               AM-PAC PT "6 Clicks" Mobility  Outcome Measure Help needed turning from your back to your side while in a flat bed without using bedrails?: A Little Help needed moving from lying on your back to sitting on the side of a flat bed without using bedrails?: A Little Help needed moving to and from a bed to a chair (including a wheelchair)?: A Little Help needed standing up from a chair using your arms (e.g., wheelchair or bedside chair)?: A Little Help needed to walk in hospital room?: A Little Help needed climbing 3-5 steps with a railing? : A Little 6 Click Score:  18    End of Session Equipment Utilized During Treatment: Gait belt Activity Tolerance: Patient limited by fatigue Patient left: with call bell/phone within reach;in bed;with family/visitor present Nurse Communication: Mobility status PT Visit Diagnosis: Unsteadiness on feet (R26.81);Muscle weakness (generalized) (M62.81)    Time: 9758-8325 PT Time Calculation (min) (ACUTE ONLY): 24 min   Charges:   PT Evaluation $PT Eval Moderate Complexity: 1 Mod PT Treatments $Gait Training: 8-22 mins        Kandyce Dieguez W,PT Acute Rehabilitation Services Pager:  [phone]  Office:  [place] 09/07/2019, 12:31 PM

## 2019-09-08 ENCOUNTER — Inpatient Hospital Stay (HOSPITAL_COMMUNITY): Payer: 59

## 2019-09-08 DIAGNOSIS — N501 Vascular disorders of male genital organs: Secondary | ICD-10-CM

## 2019-09-08 DIAGNOSIS — I2694 Multiple subsegmental pulmonary emboli without acute cor pulmonale: Principal | ICD-10-CM

## 2019-09-08 DIAGNOSIS — E876 Hypokalemia: Secondary | ICD-10-CM

## 2019-09-08 LAB — CBC
HCT: 25.4 % — ABNORMAL LOW (ref 39.0–52.0)
Hemoglobin: 8.1 g/dL — ABNORMAL LOW (ref 13.0–17.0)
MCH: 28.2 pg (ref 26.0–34.0)
MCHC: 31.9 g/dL (ref 30.0–36.0)
MCV: 88.5 fL (ref 80.0–100.0)
Platelets: 143 10*3/uL — ABNORMAL LOW (ref 150–400)
RBC: 2.87 MIL/uL — ABNORMAL LOW (ref 4.22–5.81)
RDW: 14 % (ref 11.5–15.5)
WBC: 10.6 10*3/uL — ABNORMAL HIGH (ref 4.0–10.5)
nRBC: 0 % (ref 0.0–0.2)

## 2019-09-08 LAB — TYPE AND SCREEN
ABO/RH(D): O POS
Antibody Screen: NEGATIVE
Unit division: 0

## 2019-09-08 LAB — BASIC METABOLIC PANEL
Anion gap: 7 (ref 5–15)
BUN: 12 mg/dL (ref 8–23)
CO2: 24 mmol/L (ref 22–32)
Calcium: 7.5 mg/dL — ABNORMAL LOW (ref 8.9–10.3)
Chloride: 104 mmol/L (ref 98–111)
Creatinine, Ser: 0.94 mg/dL (ref 0.61–1.24)
GFR calc Af Amer: >60 mL/min (ref >60)
GFR calc non Af Amer: >60 mL/min (ref >60)
Glucose, Bld: 108 mg/dL — ABNORMAL HIGH (ref 70–99)
Potassium: 3.8 mmol/L (ref 3.5–5.1)
Sodium: 135 mmol/L (ref 135–145)

## 2019-09-08 LAB — URINE CULTURE: Culture: NO GROWTH

## 2019-09-08 LAB — BPAM RBC
Blood Product Expiration Date: 202108092359
ISSUE DATE / TIME: 202107131230
Unit Type and Rh: 5100

## 2019-09-08 LAB — LACTIC ACID, PLASMA: Lactic Acid, Venous: 0.8 mmol/L (ref 0.5–1.9)

## 2019-09-08 LAB — PROCALCITONIN: Procalcitonin: 0.14 ng/mL

## 2019-09-08 LAB — SURGICAL PATHOLOGY

## 2019-09-08 NOTE — Progress Notes (Signed)
[facility] PROGRESS NOTE    Progress Note  Tyler [name]  CVE:[id number] DOB: 1950-09-09 DOA: 09/05/2019 PCP: [name], FNP     Brief Narrative:   Tyler [name] is an 69 y.o. male past medical history significant for prostate cancer status post robotic assisted laparoscopic prostatectomy on 09/01/2019, essential hypertension presents to the ED for evaluation of chest pain and shortness of breath most of the history is provided by the daughter.  Patient was discharged on 0/03/5850 without complications back to the ED on the day of admission where he was found to have a pulse of 105 satting 97% cardiac biomarkers basically negative SARS-CoV-2 PCR is negative CT angio of the chest showed bilateral PE with evidence of right heart strain.  On 09/07/2019 patient had a unwitnessed syncopal episode while sitting in the bed, he was found to be diaphoretic with some loss of consciousness with a blood pressure in the 70s his mentation improve right away with it awake alert and oriented reported some abdominal discomfort, CT scan of the abdomen and pelvis showed Small amount of hematoma in the anterior pelvis as well as extensive subcutaneous emphysema, he was fluid resuscitated and transfused 1 unit of packed red blood cells.,  He was fluid resuscitated and transfused 1 unit of packed red blood cells.  Assessment/Plan:   Acute Bilateral pulmonary: CT angio as below, lower extremity Doppler was negative for DVT . 2D echocardiogram showed an EF of 70% with a grade 1 diastolic heart failure. Heparin has been held due to recent pelvic bleeding, urology has agreed. Procalcitonin is low yield he has remained afebrile with a mild leukocytosis of 10.6, chest x-ray showed no infiltrates.  We will discontinue empiric antibiotics. Urology to dictate when we can start anticoagulation Heparin or DOAC  Syncopal episode/hypotension: Due to spontaneous pelvic bleeding in the setting of a recent surgery with  hematuria. Case discussed with Dr. Gloriann Loan on 09/07/2019 reported that CT results are expected status post prostatectomy. If symptom worsen may reconsider CT angio of the abdomen and pelvis. Continue IV fluids monitor vitals. Continue Foley catheter.  Urology agree with holding heparin. Blood cultures have been negative till date. Urology to dictate when to start anticoagulation.  Acute blood loss anemia: In the setting of recent prostatectomy and IV heparin. Hemoglobin dropped from 12-9, heparin was held. She was transfused 1 unit of packed red blood cells, CBC posttransfusion no shows hemoglobin of 8.1.  Next  Paroxysmal atrial fibrillation: Now in sinus rhythm continue to monitor on telemetry. Continue to hold heparin. Essential hypertension: BP was soft due to hypotensive episode in the setting of spontaneous bleed, hold lisinopril.  Prostate cancer status post robotic assisted laparoscopy prostatectomy and bilateral pelvic node dissection on 08/26/2019: Foley catheter placed by urology with hematuria as expected, will need to continue to monitor for retention while patient on anticoagulation. Awaiting further urology recommendations.   DVT prophylaxis: scd Family Communication:none Status is: Inpatient  Remains inpatient appropriate because:Hemodynamically unstable   Dispo: The patient is from: Home              Anticipated d/c is to: Home              Anticipated d/c date is: 3 days              Patient currently is not medically stable to d/c.        Code Status:     Code Status Orders  (From admission, onward)  Start     Ordered   09/05/19 1920  Full code  Continuous        09/05/19 1922        Code Status History    This patient has a current code status but no historical code status.   Advance Care Planning Activity        IV Access:    Peripheral IV   Procedures and diagnostic studies:   CT ABDOMEN PELVIS W CONTRAST  Result Date:  09/07/2019 CLINICAL DATA:  69 year old male with abdominal distension. Recent prostatectomy on 09/01/2019. Now with acute onset severe abdominal and flank pain. EXAM: CT ABDOMEN AND PELVIS WITH CONTRAST TECHNIQUE: Multidetector CT imaging of the abdomen and pelvis was performed using the standard protocol following bolus administration of intravenous contrast. CONTRAST:  117mL OMNIPAQUE IOHEXOL 300 MG/ML  SOLN COMPARISON:  CT abdomen pelvis dated 07/23/2019. FINDINGS: Lower chest: Minimal bibasilar atelectasis. The visualized lung bases are otherwise clear. Several small pockets of air along the anterior pelvic peritoneum, likely related to recent surgery. Diffuse pelvic stranding and small amount of free fluid within the pelvis as well as small perihepatic ascites. There is small amount of hematoma in the anterior pelvis measuring approximately 3 x 5 cm (70/3). Hepatobiliary: Several subcentimeter hepatic hypodense foci are too small to characterize. The liver is otherwise unremarkable. No intrahepatic biliary ductal dilatation. The gallbladder is unremarkable. Pancreas: Unremarkable. No pancreatic ductal dilatation or surrounding inflammatory changes. Spleen: Normal in size without focal abnormality. Adrenals/Urinary Tract: The adrenal glands are unremarkable. There is no hydronephrosis on either side. There is symmetric enhancement and excretion of contrast by both kidneys. Several small bilateral renal cysts. The visualized ureters appear unremarkable. The urinary bladder is decompressed around a Foley catheter. Stomach/Bowel: There is no bowel obstruction or active inflammation. The appendix is normal. Vascular/Lymphatic: The abdominal aorta and IVC are unremarkable. No portal venous gas. Several ovoid structures along the pelvic sidewalls and along the course of the external iliac vessels noted measuring up to 2.7 x 2.7 cm on the left (70/3). These appear to have layering high attenuating content or contrast  material and suspicious for a small hematomas although vascular injuries or pseudoaneurysm are not excluded. Follow-up recommended. There is no adenopathy. Reproductive: Prostatectomy. The seminal vesicles are unremarkable. Other: There is extensive subcutaneous emphysema extending from the scrotal wall to the anterior pelvic wall and anterior abdominal wall. Musculoskeletal: No acute or significant osseous findings. IMPRESSION: 1. Status post prostatectomy. Small amount of hematoma in the anterior pelvis as well as extensive subcutaneous emphysema. 2. Several ovoid structures along the pelvic sidewalls and along the course of the external iliac vessels suspicious for a small hematomas versus vascular injuries or pseudoaneurysm. Follow-up recommended. 3. No bowel obstruction. Normal appendix. Electronically Signed   By: Anner Crete M.D.   On: 09/07/2019 02:26   DG Chest Port 1 View  Result Date: 09/07/2019 CLINICAL DATA:  Dyspnea. EXAM: PORTABLE CHEST 1 VIEW COMPARISON:  September 05, 2019. FINDINGS: The heart size and mediastinal contours are within normal limits. Both lungs are clear. No pneumothorax or pleural effusion is noted. The visualized skeletal structures are unremarkable. IMPRESSION: No active disease. Electronically Signed   By: Marijo Conception M.D.   On: 09/07/2019 08:49   ECHOCARDIOGRAM COMPLETE  Result Date: 09/06/2019    ECHOCARDIOGRAM REPORT   Patient Name:   Tyler [name] Date of Exam: 09/06/2019 Medical Rec #:  [id number]    Height:  67.0 in Accession #:    [id number]   Weight:       196.0 lb Date of Birth:  [birth year]    BSA:          2.005 m Patient Age:    27 years     BP:           136/88 mmHg Patient Gender: M            HR:           74 bpm. Exam Location:  Inpatient Procedure: 2D Echo Indications:    pulmonary embolus 415.19  History:        Patient has no prior history of Echocardiogram examinations.                 Arrythmias:paroxysmal a-fib; Risk Factors:Hypertension.   Sonographer:    Johny Chess Referring Phys: [id number] [place]  1. Left ventricular ejection fraction, by estimation, is 70 to 75%. The left ventricle has hyperdynamic function. The left ventricle has no regional wall motion abnormalities. Left ventricular diastolic parameters are consistent with Grade I diastolic dysfunction (impaired relaxation).  2. Right ventricular systolic function is normal. The right ventricular size is normal.  3. The mitral valve is normal in structure. No evidence of mitral valve regurgitation. No evidence of mitral stenosis.  4. The aortic valve is normal in structure. Aortic valve regurgitation is not visualized. Mild aortic valve sclerosis is present, with no evidence of aortic valve stenosis.  5. The inferior vena cava is normal in size with greater than 50% respiratory variability, suggesting right atrial pressure of 3 mmHg. FINDINGS  Left Ventricle: Left ventricular ejection fraction, by estimation, is 70 to 75%. The left ventricle has hyperdynamic function. The left ventricle has no regional wall motion abnormalities. The left ventricular internal cavity size was normal in size. There is no left ventricular hypertrophy. Left ventricular diastolic parameters are consistent with Grade I diastolic dysfunction (impaired relaxation). Right Ventricle: The right ventricular size is normal.Right ventricular systolic function is normal. Left Atrium: Left atrial size was normal in size. Right Atrium: Right atrial size was normal in size. Pericardium: There is no evidence of pericardial effusion. Mitral Valve: The mitral valve is normal in structure. Normal mobility of the mitral valve leaflets. No evidence of mitral valve regurgitation. No evidence of mitral valve stenosis. Tricuspid Valve: The tricuspid valve is normal in structure. Tricuspid valve regurgitation is trivial. No evidence of tricuspid stenosis. Aortic Valve: The aortic valve is normal in structure. Aortic  valve regurgitation is not visualized. Mild aortic valve sclerosis is present, with no evidence of aortic valve stenosis. Pulmonic Valve: The pulmonic valve was normal in structure. Pulmonic valve regurgitation is not visualized. No evidence of pulmonic stenosis. Aorta: The aortic root is normal in size and structure. Venous: The inferior vena cava is normal in size with greater than 50% respiratory variability, suggesting right atrial pressure of 3 mmHg. IAS/Shunts: No atrial level shunt detected by color flow Doppler.  LEFT VENTRICLE PLAX 2D LVIDd:         4.30 cm  Diastology LVIDs:         2.50 cm  LV e' lateral:   9.57 cm/s LV PW:         1.10 cm  LV E/e' lateral: 6.6 LV IVS:        1.10 cm  LV e' medial:    7.40 cm/s LVOT diam:     2.00 cm  LV  E/e' medial:  8.6 LV SV:         66 LV SV Index:   33 LVOT Area:     3.14 cm  RIGHT VENTRICLE             IVC RV S prime:     18.90 cm/s  IVC diam: 1.70 cm LEFT ATRIUM             Index       RIGHT ATRIUM          Index LA diam:        3.10 cm 1.55 cm/m  RA Area:     7.24 cm LA Vol (A2C):   26.5 ml 13.22 ml/m RA Volume:   10.20 ml 5.09 ml/m LA Vol (A4C):   39.8 ml 19.85 ml/m LA Biplane Vol: 33.6 ml 16.76 ml/m  AORTIC VALVE LVOT Vmax:   120.00 cm/s LVOT Vmean:  82.000 cm/s LVOT VTI:    0.210 m  AORTA Ao Root diam: 3.10 cm Ao Asc diam:  3.20 cm MITRAL VALVE MV Area (PHT): 2.69 cm    SHUNTS MV Decel Time: 282 msec    Systemic VTI:  0.21 m MV E velocity: 63.40 cm/s  Systemic Diam: 2.00 cm MV A velocity: 75.40 cm/s MV E/A ratio:  0.84 Kirk Ruths MD Electronically signed by Kirk Ruths MD Signature Date/Time: 09/06/2019/1:49:57 PM    Final    VAS [place] LOWER EXTREMITY VENOUS (DVT)  Result Date: 09/07/2019  Lower Venous DVT Study Indications: Pulmonary embolism, and Edema.  Risk Factors: Cancer Prostate S/P prostatectomy 09/01/19. Limitations: Bandages and Foley. Comparison Study: No prior study on file for comparison Performing Technologist: Sharion Dove RVS   Examination Guidelines: A complete evaluation includes B-mode imaging, spectral Doppler, color Doppler, and power Doppler as needed of all accessible portions of each vessel. Bilateral testing is considered an integral part of a complete examination. Limited examinations for reoccurring indications may be performed as noted. The reflux portion of the exam is performed with the patient in reverse Trendelenburg.  +---------+---------------+---------+-----------+----------+--------------+ RIGHT    CompressibilityPhasicitySpontaneityPropertiesThrombus Aging +---------+---------------+---------+-----------+----------+--------------+ CFV      Full           Yes      Yes                                 +---------+---------------+---------+-----------+----------+--------------+ SFJ      Full                                                        +---------+---------------+---------+-----------+----------+--------------+ FV Prox  Full                                                        +---------+---------------+---------+-----------+----------+--------------+ FV Mid   Full                                                        +---------+---------------+---------+-----------+----------+--------------+  FV Distal                                             Not visualized +---------+---------------+---------+-----------+----------+--------------+ PFV      Full                                                        +---------+---------------+---------+-----------+----------+--------------+ POP      Full           Yes      Yes                                 +---------+---------------+---------+-----------+----------+--------------+ PTV      Full                                                        +---------+---------------+---------+-----------+----------+--------------+ PERO     Full                                                         +---------+---------------+---------+-----------+----------+--------------+   +---------+---------------+---------+-----------+----------+--------------+ LEFT     CompressibilityPhasicitySpontaneityPropertiesThrombus Aging +---------+---------------+---------+-----------+----------+--------------+ CFV      Full           Yes      Yes                                 +---------+---------------+---------+-----------+----------+--------------+ SFJ      Full                                                        +---------+---------------+---------+-----------+----------+--------------+ FV Prox  Full                                                        +---------+---------------+---------+-----------+----------+--------------+ FV Mid   Full                                                        +---------+---------------+---------+-----------+----------+--------------+ FV DistalFull                                                        +---------+---------------+---------+-----------+----------+--------------+  PFV      Full                                                        +---------+---------------+---------+-----------+----------+--------------+ POP      Full           Yes      Yes                                 +---------+---------------+---------+-----------+----------+--------------+ PTV      Full                                                        +---------+---------------+---------+-----------+----------+--------------+ PERO     Full                                                        +---------+---------------+---------+-----------+----------+--------------+     Summary: RIGHT: - There is no evidence of deep vein thrombosis in the lower extremity. However, portions of this examination were limited- see technologist comments above.  LEFT: - There is no evidence of deep vein thrombosis in the lower extremity.  *See table(s) above  for measurements and observations. Electronically signed by Ruta Hinds MD on 09/07/2019 at 10:09:01 AM.    Final      Medical Consultants:    None.  Anti-Infectives:   none  Subjective:    Tyler [name] he relates he is feeling better, is having some mild suprapubic pain  Objective:    Vitals:   09/08/19 0000 09/08/19 0006 09/08/19 0119 09/08/19 0530  BP: 136/77   132/77  Pulse: (!) 108 (!) 107  91  Resp: 20   19  Temp: 99.1 F (37.3 C)   99.6 F (37.6 C)  TempSrc: Oral   Oral  SpO2: 97% 91%  90%  Weight:   90.1 kg   Height:       SpO2: 90 %   Intake/Output Summary (Last 24 hours) at 09/08/2019 1007 Last data filed at 09/08/2019 0500 Gross per 24 hour  Intake 1972.5 ml  Output 500 ml  Net 1472.5 ml   Filed Weights   09/05/19 1323 09/07/19 0000 09/08/19 0119  Weight: 88.9 kg 87.3 kg 90.1 kg    Exam: General exam: In no acute distress. Respiratory system: Good air movement and clear to auscultation. Cardiovascular system: S1 & S2 heard, RRR.  Gastrointestinal system: Positive bowel sounds soft with suprapubic tenderness no rebound or guarding. Extremities: No pedal edema. Skin: No rashes, lesions or ulcers Psychiatry: Judgement and insight appear normal. Mood & affect appropriate.    Data Reviewed:    Labs: Basic Metabolic Panel: Recent Labs  Lab 09/05/19 1419 09/05/19 1419 09/06/19 0218 09/06/19 0218 09/07/19 0111 09/07/19 0111 09/07/19 0858 09/08/19 0614  NA 139  --  139  --  132*  --  136 135  K 3.3*   < > 3.9   < > 3.8   < >  4.1 3.8  CL 103  --  104  --  99  --  100 104  CO2 25  --  26  --  24  --  25 24  GLUCOSE 129*  --  105*  --  111*  --  151* 108*  BUN 10  --  11  --  11  --  15 12  CREATININE 0.96  --  1.17  --  0.96  --  1.52* 0.94  CALCIUM 8.6*  --  8.6*  --  8.0*  --  7.8* 7.5*  MG 1.7  --  1.7  --   --   --   --   --    < > = values in this interval not displayed.   GFR Estimated Creatinine Clearance: 81.7 mL/min (by  C-G formula based on SCr of 0.94 mg/dL). Liver Function Tests: Recent Labs  Lab 09/07/19 0858  AST 20  ALT 15  ALKPHOS 60  BILITOT 1.3*  PROT 5.6*  ALBUMIN 2.8*   No results for input(s): LIPASE, AMYLASE in the last 168 hours. No results for input(s): AMMONIA in the last 168 hours. Coagulation profile No results for input(s): INR, PROTIME in the last 168 hours. COVID-19 Labs  No results for input(s): DDIMER, FERRITIN, LDH, CRP in the last 72 hours.  Lab Results  Component Value Date   SARSCOV2NAA NEGATIVE 09/05/2019   [place] NEGATIVE 08/28/2019    CBC: Recent Labs  Lab 09/06/19 0218 09/07/19 0111 09/07/19 0858 09/07/19 1721 09/08/19 0614  WBC 7.3 8.0 11.6* 13.5* 10.6*  HGB 11.8* 10.0* 8.9* 9.3* 8.1*  HCT 37.0* 32.4* 28.9* 29.4* 25.4*  MCV 85.6 87.6 88.4 87.8 88.5  PLT 211 189 184 171 143*   Cardiac Enzymes: No results for input(s): CKTOTAL, CKMB, CKMBINDEX, TROPONINI in the last 168 hours. BNP (last 3 results) No results for input(s): PROBNP in the last 8760 hours. CBG: No results for input(s): GLUCAP in the last 168 hours. D-Dimer: No results for input(s): DDIMER in the last 72 hours. Hgb A1c: No results for input(s): HGBA1C in the last 72 hours. Lipid Profile: No results for input(s): CHOL, HDL, LDLCALC, TRIG, CHOLHDL, LDLDIRECT in the last 72 hours. Thyroid function studies: Recent Labs    09/05/19 1419  TSH 1.644   Anemia work up: No results for input(s): VITAMINB12, FOLATE, FERRITIN, TIBC, IRON, RETICCTPCT in the last 72 hours. Sepsis Labs: Recent Labs  Lab 09/07/19 0111 09/07/19 0858 09/07/19 1147 09/07/19 1721 09/08/19 0614  PROCALCITON  --  <0.10  --   --  0.14  WBC 8.0 11.6*  --  13.5* 10.6*  LATICACIDVEN  --  2.5* 2.7*  --  0.8   Microbiology Recent Results (from the past 240 hour(s))  SARS Coronavirus 2 by RT PCR (hospital order, performed in [place] hospital lab) Nasopharyngeal Nasopharyngeal Swab     Status: None    Collection Time: 09/05/19  5:37 PM   Specimen: Nasopharyngeal Swab  Result Value Ref Range Status   SARS Coronavirus 2 NEGATIVE NEGATIVE Final    Comment: (NOTE) SARS-CoV-2 target nucleic acids are NOT DETECTED.  The SARS-CoV-2 RNA is generally detectable in upper and lower respiratory specimens during the acute phase of infection. The lowest concentration of SARS-CoV-2 viral copies this assay can detect is 250 copies / mL. A negative result does not preclude SARS-CoV-2 infection and should not be used as the sole basis for treatment or other patient management decisions.  A negative result may  occur with improper specimen collection / handling, submission of specimen other than nasopharyngeal swab, presence of viral mutation(s) within the areas targeted by this assay, and inadequate number of viral copies (<250 copies / mL). A negative result must be combined with clinical observations, patient history, and epidemiological information.  Fact Sheet for Patients:   StrictlyIdeas.no  Fact Sheet for Healthcare Providers: BankingDealers.co.za  This test is not yet approved or  cleared by the [place] FDA and has been authorized for detection and/or diagnosis of SARS-CoV-2 by FDA under an Emergency Use Authorization (EUA).  This EUA will remain in effect (meaning this test can be used) for the duration of the COVID-19 declaration under Section 564(b)(1) of the Act, 21 U.S.C. section 360bbb-3(b)(1), unless the authorization is terminated or revoked sooner.  Performed at [facility], [place] [address], [place], [place] [address]   Culture, blood (routine x 2)     Status: None (Preliminary result)   Collection Time: 09/06/19  1:04 AM   Specimen: BLOOD RIGHT HAND  Result Value Ref Range Status   Specimen Description BLOOD RIGHT HAND  Final   Special Requests   Final    BOTTLES DRAWN AEROBIC AND ANAEROBIC Blood Culture results may  not be optimal due to an excessive volume of blood received in culture bottles   Culture   Final    NO GROWTH 2 DAYS Performed at [facility], [place] [address] [place], [place] [address]    Report Status PENDING  Incomplete  Culture, blood (routine x 2)     Status: None (Preliminary result)   Collection Time: 09/06/19  2:18 AM   Specimen: BLOOD LEFT FOREARM  Result Value Ref Range Status   Specimen Description BLOOD LEFT FOREARM  Final   Special Requests   Final    BOTTLES DRAWN AEROBIC AND ANAEROBIC Blood Culture adequate volume   Culture   Final    NO GROWTH 2 DAYS Performed at [facility], [place] [address] [place], [place] [address]    Report Status PENDING  Incomplete  Culture, Urine     Status: None   Collection Time: 09/07/19  1:00 PM   Specimen: Urine, Random  Result Value Ref Range Status   Specimen Description URINE, RANDOM  Final   Special Requests NONE  Final   Culture   Final    NO GROWTH Performed at [facility], [place] [address] [place], [place] [address]    Report Status 09/08/2019 FINAL  Final     Medications:   . Chlorhexidine Gluconate Cloth  6 each Topical Daily  . dextromethorphan-guaiFENesin  1 tablet Oral BID  . oxybutynin  5 mg Oral QHS  . polyethylene glycol  17 g Oral BID  . senna-docusate  1 tablet Oral BID  . sodium chloride flush  3 mL Intravenous Q12H   Continuous Infusions: . sodium chloride 100 mL/hr at 09/07/19 1702  . ceFEPime (MAXIPIME) IV 2 g (09/07/19 2359)      LOS: 2 days   Charlynne Cousins  [facility]  09/08/2019, 10:07 AM

## 2019-09-09 LAB — CBC
HCT: 25.9 % — ABNORMAL LOW (ref 39.0–52.0)
Hemoglobin: 8.2 g/dL — ABNORMAL LOW (ref 13.0–17.0)
MCH: 28.4 pg (ref 26.0–34.0)
MCHC: 31.7 g/dL (ref 30.0–36.0)
MCV: 89.6 fL (ref 80.0–100.0)
Platelets: 175 10*3/uL (ref 150–400)
RBC: 2.89 MIL/uL — ABNORMAL LOW (ref 4.22–5.81)
RDW: 14.2 % (ref 11.5–15.5)
WBC: 9.2 10*3/uL (ref 4.0–10.5)
nRBC: 0.2 % (ref 0.0–0.2)

## 2019-09-09 LAB — BASIC METABOLIC PANEL
Anion gap: 6 (ref 5–15)
BUN: 7 mg/dL — ABNORMAL LOW (ref 8–23)
CO2: 26 mmol/L (ref 22–32)
Calcium: 7.7 mg/dL — ABNORMAL LOW (ref 8.9–10.3)
Chloride: 106 mmol/L (ref 98–111)
Creatinine, Ser: 0.88 mg/dL (ref 0.61–1.24)
GFR calc Af Amer: >60 mL/min (ref >60)
GFR calc non Af Amer: >60 mL/min (ref >60)
Glucose, Bld: 97 mg/dL (ref 70–99)
Potassium: 3.6 mmol/L (ref 3.5–5.1)
Sodium: 138 mmol/L (ref 135–145)

## 2019-09-09 LAB — PROCALCITONIN: Procalcitonin: <0.1 ng/mL

## 2019-09-09 NOTE — Progress Notes (Signed)
[facility] PROGRESS NOTE    Progress Note  Tyler [name]  VZC:[id number] DOB: Jun 26, 1950 DOA: 09/05/2019 PCP: [name], FNP     Brief Narrative:   Tyler [name] is an 69 y.o. male past medical history significant for prostate cancer status post robotic assisted laparoscopic prostatectomy on 09/01/2019, essential hypertension presents to the ED for evaluation of chest pain and shortness of breath most of the history is provided by the daughter.  Patient was discharged on 02/27/8784 without complications back to the ED on the day of admission where he was found to have a pulse of 105 satting 97% cardiac biomarkers basically negative SARS-CoV-2 PCR is negative CT angio of the chest showed bilateral PE with evidence of right heart strain.  On 09/07/2019 patient had a unwitnessed syncopal episode while sitting in the bed, he was found to be diaphoretic with some loss of consciousness with a blood pressure in the 70s his mentation improve right away with it awake alert and oriented reported some abdominal discomfort, CT scan of the abdomen and pelvis showed Small amount of hematoma in the anterior pelvis as well as extensive subcutaneous emphysema, he was fluid resuscitated and transfused 1 unit of packed red blood cells.,  He was fluid resuscitated and transfused 1 unit of packed red blood cells.  Assessment/Plan:   Acute Bilateral pulmonary: Continue hold heparin, leukocytosis improving. Procalcitonin is low yield has remained afebrile chest x-ray shows no infiltrates his leukocytosis is resolved. Urology to dictate when we can start anticoagulation Heparin or DOAC  Syncopal episode/hypotension: Due to spontaneous pelvic bleeding in the setting of a recent surgery with hematuria. Case discussed with Dr. Gloriann Loan on 09/07/2019 reported that CT results are expected status post prostatectomy. Hemoglobin is stable.  KVO IV fluids. Blood cultures continue to be negative till date. Urology to  dictate when to start anticoagulation.  Acute blood loss anemia: In the setting of recent prostatectomy and IV heparin.  Heparin was held. He status post 1 unit of packed red blood cells.  Paroxysmal atrial fibrillation: Now in sinus rhythm continue to monitor on telemetry. Continue to hold heparin.  Essential hypertension: BP was soft due to hypotensive episode in the setting of spontaneous bleed, hold lisinopril.  Prostate cancer status post robotic assisted laparoscopy prostatectomy and bilateral pelvic node dissection on 08/26/2019: Foley catheter placed by urology with hematuria as expected, will need to continue to monitor for retention while patient on anticoagulation. Awaiting further urology recommendations.   DVT prophylaxis: scd Family Communication:none Status is: Inpatient  Remains inpatient appropriate because:Hemodynamically unstable   Dispo: The patient is from: Home              Anticipated d/c is to: Home              Anticipated d/c date is: 3 days              Patient currently is not medically stable to d/c.        Code Status:     Code Status Orders  (From admission, onward)         Start     Ordered   09/05/19 1920  Full code  Continuous        09/05/19 1922        Code Status History    This patient has a current code status but no historical code status.   Advance Care Planning Activity        IV Access:  Peripheral IV   Procedures and diagnostic studies:   DG Chest Port 1 View  Result Date: 09/07/2019 CLINICAL DATA:  Dyspnea. EXAM: PORTABLE CHEST 1 VIEW COMPARISON:  September 05, 2019. FINDINGS: The heart size and mediastinal contours are within normal limits. Both lungs are clear. No pneumothorax or pleural effusion is noted. The visualized skeletal structures are unremarkable. IMPRESSION: No active disease. Electronically Signed   By: Marijo Conception M.D.   On: 09/07/2019 08:49     Medical Consultants:     None.  Anti-Infectives:   none  Subjective:    Tyler [name] patient is significantly anxious and nervous, complaining of some itching around the site.  Objective:    Vitals:   09/08/19 2019 09/09/19 0010 09/09/19 0146 09/09/19 0419  BP: 136/81 131/74  137/78  Pulse: 84 79  82  Resp: 20 17  15   Temp: (!) 100.4 F (38 C) 99.4 F (37.4 C)  99.1 F (37.3 C)  TempSrc: Oral Oral  Oral  SpO2: 95% 93%  95%  Weight:   91.5 kg   Height:       SpO2: 95 %   Intake/Output Summary (Last 24 hours) at 09/09/2019 0812 Last data filed at 09/09/2019 0426 Gross per 24 hour  Intake 1080 ml  Output 2700 ml  Net -1620 ml   Filed Weights   09/07/19 0000 09/08/19 0119 09/09/19 0146  Weight: 87.3 kg 90.1 kg 91.5 kg    Exam: General exam: In no acute distress. Respiratory system: Good air movement and clear to auscultation. Cardiovascular system: S1 & S2 heard, RRR. No JVD. Gastrointestinal system: Abdomen is nondistended, soft and nontender.  Extremities: No pedal edema. Skin: No rashes, lesions or ulcers Psychiatry: Judgement and insight appear normal. Mood & affect appropriate.   Data Reviewed:    Labs: Basic Metabolic Panel: Recent Labs  Lab 09/05/19 1419 09/05/19 1419 09/06/19 0218 09/06/19 0218 09/07/19 0111 09/07/19 0111 09/07/19 0858 09/07/19 0858 09/08/19 0614 09/09/19 0520  NA 139   < > 139  --  132*  --  136  --  135 138  K 3.3*   < > 3.9   < > 3.8   < > 4.1   < > 3.8 3.6  CL 103   < > 104  --  99  --  100  --  104 106  CO2 25   < > 26  --  24  --  25  --  24 26  GLUCOSE 129*   < > 105*  --  111*  --  151*  --  108* 97  BUN 10   < > 11  --  11  --  15  --  12 7*  CREATININE 0.96   < > 1.17  --  0.96  --  1.52*  --  0.94 0.88  CALCIUM 8.6*   < > 8.6*  --  8.0*  --  7.8*  --  7.5* 7.7*  MG 1.7  --  1.7  --   --   --   --   --   --   --    < > = values in this interval not displayed.   GFR Estimated Creatinine Clearance: 87.9 mL/min (by C-G formula  based on SCr of 0.88 mg/dL). Liver Function Tests: Recent Labs  Lab 09/07/19 0858  AST 20  ALT 15  ALKPHOS 60  BILITOT 1.3*  PROT 5.6*  ALBUMIN 2.8*   No results for  input(s): LIPASE, AMYLASE in the last 168 hours. No results for input(s): AMMONIA in the last 168 hours. Coagulation profile No results for input(s): INR, PROTIME in the last 168 hours. COVID-19 Labs  No results for input(s): DDIMER, FERRITIN, LDH, CRP in the last 72 hours.  Lab Results  Component Value Date   SARSCOV2NAA NEGATIVE 09/05/2019   [place] NEGATIVE 08/28/2019    CBC: Recent Labs  Lab 09/07/19 0111 09/07/19 0858 09/07/19 1721 09/08/19 0614 09/09/19 0520  WBC 8.0 11.6* 13.5* 10.6* 9.2  HGB 10.0* 8.9* 9.3* 8.1* 8.2*  HCT 32.4* 28.9* 29.4* 25.4* 25.9*  MCV 87.6 88.4 87.8 88.5 89.6  PLT 189 184 171 143* 175   Cardiac Enzymes: No results for input(s): CKTOTAL, CKMB, CKMBINDEX, TROPONINI in the last 168 hours. BNP (last 3 results) No results for input(s): PROBNP in the last 8760 hours. CBG: No results for input(s): GLUCAP in the last 168 hours. D-Dimer: No results for input(s): DDIMER in the last 72 hours. Hgb A1c: No results for input(s): HGBA1C in the last 72 hours. Lipid Profile: No results for input(s): CHOL, HDL, LDLCALC, TRIG, CHOLHDL, LDLDIRECT in the last 72 hours. Thyroid function studies: No results for input(s): TSH, T4TOTAL, T3FREE, THYROIDAB in the last 72 hours.  Invalid input(s): FREET3 Anemia work up: No results for input(s): VITAMINB12, FOLATE, FERRITIN, TIBC, IRON, RETICCTPCT in the last 72 hours. Sepsis Labs: Recent Labs  Lab 09/07/19 0858 09/07/19 1147 09/07/19 1721 09/08/19 0614 09/09/19 0520  PROCALCITON <0.10  --   --  0.14 <0.10  WBC 11.6*  --  13.5* 10.6* 9.2  LATICACIDVEN 2.5* 2.7*  --  0.8  --    Microbiology Recent Results (from the past 240 hour(s))  SARS Coronavirus 2 by RT PCR (hospital order, performed in [place] hospital lab)  Nasopharyngeal Nasopharyngeal Swab     Status: None   Collection Time: 09/05/19  5:37 PM   Specimen: Nasopharyngeal Swab  Result Value Ref Range Status   SARS Coronavirus 2 NEGATIVE NEGATIVE Final    Comment: (NOTE) SARS-CoV-2 target nucleic acids are NOT DETECTED.  The SARS-CoV-2 RNA is generally detectable in upper and lower respiratory specimens during the acute phase of infection. The lowest concentration of SARS-CoV-2 viral copies this assay can detect is 250 copies / mL. A negative result does not preclude SARS-CoV-2 infection and should not be used as the sole basis for treatment or other patient management decisions.  A negative result may occur with improper specimen collection / handling, submission of specimen other than nasopharyngeal swab, presence of viral mutation(s) within the areas targeted by this assay, and inadequate number of viral copies (<250 copies / mL). A negative result must be combined with clinical observations, patient history, and epidemiological information.  Fact Sheet for Patients:   StrictlyIdeas.no  Fact Sheet for Healthcare Providers: BankingDealers.co.za  This test is not yet approved or  cleared by the [place] FDA and has been authorized for detection and/or diagnosis of SARS-CoV-2 by FDA under an Emergency Use Authorization (EUA).  This EUA will remain in effect (meaning this test can be used) for the duration of the COVID-19 declaration under Section 564(b)(1) of the Act, 21 U.S.C. section 360bbb-3(b)(1), unless the authorization is terminated or revoked sooner.  Performed at [facility], [place] [address], [place], [place] [address]   Culture, blood (routine x 2)     Status: None (Preliminary result)   Collection Time: 09/06/19  1:04 AM   Specimen: BLOOD RIGHT HAND  Result Value Ref Range Status   Specimen Description BLOOD RIGHT HAND  Final   Special Requests   Final     BOTTLES DRAWN AEROBIC AND ANAEROBIC Blood Culture results may not be optimal due to an excessive volume of blood received in culture bottles   Culture   Final    NO GROWTH 2 DAYS Performed at [facility] [address], [place], [place] [address]    Report Status PENDING  Incomplete  Culture, blood (routine x 2)     Status: None (Preliminary result)   Collection Time: 09/06/19  2:18 AM   Specimen: BLOOD LEFT FOREARM  Result Value Ref Range Status   Specimen Description BLOOD LEFT FOREARM  Final   Special Requests   Final    BOTTLES DRAWN AEROBIC AND ANAEROBIC Blood Culture adequate volume   Culture   Final    NO GROWTH 2 DAYS Performed at [facility], [place] [address] [place], [place] [address]    Report Status PENDING  Incomplete  Culture, Urine     Status: None   Collection Time: 09/07/19  1:00 PM   Specimen: Urine, Random  Result Value Ref Range Status   Specimen Description URINE, RANDOM  Final   Special Requests NONE  Final   Culture   Final    NO GROWTH Performed at [facility], [place] [address], [place], [place] [address]    Report Status 09/08/2019 FINAL  Final     Medications:    Chlorhexidine Gluconate Cloth  6 each Topical Daily   dextromethorphan-guaiFENesin  1 tablet Oral BID   oxybutynin  5 mg Oral QHS   polyethylene glycol  17 g Oral BID   senna-docusate  1 tablet Oral BID   sodium chloride flush  3 mL Intravenous Q12H   Continuous Infusions:  sodium chloride 100 mL/hr at 09/07/19 1702      LOS: 3 days   Charlynne Cousins  [facility]  09/09/2019, 8:12 AM

## 2019-09-09 NOTE — Progress Notes (Addendum)
Physical Therapy Treatment Patient Details Name: Tyler [name] MRN: [id number] DOB: December 22, 1950 Today's Date: 09/09/2019    History of Present Illness He is s/p prostatectomy on 7/7, on IV heparin for PE, and developed acute pain in lower abd and b/l flanks this evening.  Hematoma bil groin.     PT Comments    Pt admitted with above diagnosis. Pt was able to ambulate with RW in room and incr distance with good overall safetly and stability. Progressing. Pt currently with functional limitations due to balance and endurance deficits. Pt will benefit from skilled PT to increase their independence and safety with mobility to allow discharge to the venue listed below.    Orthostatic BPs  Supine 141/89, 78 bpm  Sitting 134/81, 87 bpm  Standing 133/89, 86 bpm  Standing after 3 min 148/91, 79 bpm    Follow Up Recommendations  Home health PT;Supervision/Assistance - 24 hour (HHOT)     Equipment Recommendations  3in1 (PT)    Recommendations for Other Services       Precautions / Restrictions Precautions Precautions: Fall Restrictions Weight Bearing Restrictions: No    Mobility  Bed Mobility Overal bed mobility: Needs Assistance Bed Mobility: Rolling;Sidelying to Sit Rolling: Min guard Sidelying to sit: Min guard       General bed mobility comments: Increased time/effort 2/2 pain.   Transfers Overall transfer level: Needs assistance Equipment used: Rolling walker (2 wheeled) Transfers: Sit to/from Stand Sit to Stand: Min guard         General transfer comment: Cues for hand placement and safety with RW.   Ambulation/Gait Ambulation/Gait assistance: Min guard Gait Distance (Feet): 90 Feet Assistive device: Rolling walker (2 wheeled) Gait Pattern/deviations: Step-through pattern;Decreased stride length;Trunk flexed;Wide base of support;Drifts right/left   Gait velocity interpretation: <1.31 ft/sec, indicative of household ambulator General Gait Details: Pt dizzy with  slight orthostatic BPS.  Was able to incr distance in room.    Stairs             Wheelchair Mobility    Modified Rankin (Stroke Patients Only)       Balance Overall balance assessment: Needs assistance Sitting-balance support: No upper extremity supported;Feet supported Sitting balance-Leahy Scale: Fair     Standing balance support: Bilateral upper extremity supported;During functional activity Standing balance-Leahy Scale: Poor Standing balance comment: reliance on BUE w/ RW                            Cognition Arousal/Alertness: Awake/alert Behavior During Therapy: WFL for tasks assessed/performed Overall Cognitive Status: Difficult to assess                                 General Comments: Patient prefers language other than [place]. Granddaughter at bedside to translate.       Exercises General Exercises - Lower Extremity Long Arc Quad: AROM;Both;10 reps;Seated Hip Flexion/Marching: AROM;Both;10 reps;Seated    General Comments        Pertinent Vitals/Pain Pain Assessment: Faces Faces Pain Scale: Hurts even more Pain Location: bil groin Pain Descriptors / Indicators: Aching;Grimacing;Guarding Pain Intervention(s): Limited activity within patient's tolerance;Monitored during session;Repositioned    Home Living                      Prior Function            PT Goals (current goals can now be found  in the care plan section) Acute Rehab PT Goals Patient Stated Goal: to get better and go home Progress towards PT goals: Progressing toward goals    Frequency    Min 3X/week      PT Plan Current plan remains appropriate    Co-evaluation              AM-PAC PT "6 Clicks" Mobility   Outcome Measure  Help needed turning from your back to your side while in a flat bed without using bedrails?: A Little Help needed moving from lying on your back to sitting on the side of a flat bed without using bedrails?:  A Little Help needed moving to and from a bed to a chair (including a wheelchair)?: A Little Help needed standing up from a chair using your arms (e.g., wheelchair or bedside chair)?: A Little Help needed to walk in hospital room?: A Little Help needed climbing 3-5 steps with a railing? : A Little 6 Click Score: 18    End of Session Equipment Utilized During Treatment: Gait belt Activity Tolerance: Patient limited by fatigue Patient left: with call bell/phone within reach;in chair;with chair alarm set;with family/visitor present Nurse Communication: Mobility status PT Visit Diagnosis: Unsteadiness on feet (R26.81);Muscle weakness (generalized) (M62.81)     Time: 4818-5631 PT Time Calculation (min) (ACUTE ONLY): 20 min  Charges:  $Gait Training: 8-22 mins                     Chrishauna Mee W,PT [place] Pager:  [phone]  Office:  [place] 09/09/2019, 2:06 PM

## 2019-09-09 NOTE — Progress Notes (Signed)
Pt temp 101.3 . Rechecked temp 98.9. Provider notified. No complains at this time. Will monitor.

## 2019-09-09 NOTE — Progress Notes (Addendum)
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 OT will continue efforts toward POC.   1402: Attempted treatment again this afternoon. Patient found asleep in bed. OT will continue efforts toward POC.   Gloris Manchester OTR/L Supplemental OT, Department of rehab services [phone]  Mayan Dolney R H. 09/09/2019, 1:06 PM

## 2019-09-10 LAB — HEPARIN LEVEL (UNFRACTIONATED): Heparin Unfractionated: 0.12 IU/mL — ABNORMAL LOW (ref 0.30–0.70)

## 2019-09-10 LAB — CBC
HCT: 28.1 % — ABNORMAL LOW (ref 39.0–52.0)
Hemoglobin: 9.1 g/dL — ABNORMAL LOW (ref 13.0–17.0)
MCH: 28.5 pg (ref 26.0–34.0)
MCHC: 32.4 g/dL (ref 30.0–36.0)
MCV: 88.1 fL (ref 80.0–100.0)
Platelets: 234 10*3/uL (ref 150–400)
RBC: 3.19 MIL/uL — ABNORMAL LOW (ref 4.22–5.81)
RDW: 14.1 % (ref 11.5–15.5)
WBC: 9.5 10*3/uL (ref 4.0–10.5)
nRBC: 0.4 % — ABNORMAL HIGH (ref 0.0–0.2)

## 2019-09-10 LAB — BASIC METABOLIC PANEL
Anion gap: 11 (ref 5–15)
BUN: 5 mg/dL — ABNORMAL LOW (ref 8–23)
CO2: 24 mmol/L (ref 22–32)
Calcium: 8.2 mg/dL — ABNORMAL LOW (ref 8.9–10.3)
Chloride: 103 mmol/L (ref 98–111)
Creatinine, Ser: 0.87 mg/dL (ref 0.61–1.24)
GFR calc Af Amer: >60 mL/min (ref >60)
GFR calc non Af Amer: >60 mL/min (ref >60)
Glucose, Bld: 99 mg/dL (ref 70–99)
Potassium: 3.6 mmol/L (ref 3.5–5.1)
Sodium: 138 mmol/L (ref 135–145)

## 2019-09-10 MED ORDER — HEPARIN (PORCINE) 25000 UT/250ML-% IV SOLN
1350.0000 [IU]/h | INTRAVENOUS | Status: DC
  Administered 2019-09-10: 1050 [IU]/h via INTRAVENOUS
  Administered 2019-09-11: 1350 [IU]/h via INTRAVENOUS
  Filled 2019-09-10 (×2): qty 250

## 2019-09-10 NOTE — Progress Notes (Signed)
ANTICOAGULATION CONSULT NOTE - Follow Up Consult  Pharmacy Consult for heparin Indication: pulmonary embolus  Allergies  Allergen Reactions  . Amlodipine Swelling    BILATERAL FEET TO ANKLES    Patient Measurements: Height: 5\' 7"  (170.2 cm) Weight: 90 kg (198 lb 8 oz) IBW/kg (Calculated) : 66.1 Heparin Dosing Weight: 84.5 kg  Vital Signs: Temp: 98.4 F (36.9 C) (07/16 1703) Temp Source: Oral (07/16 1703) BP: 153/90 (07/16 1703) Pulse Rate: 73 (07/16 1703)  Labs: Recent Labs    09/08/19 0614 09/08/19 0614 09/09/19 0520 09/10/19 0459 09/10/19 1606  HGB 8.1*   < > 8.2* 9.1*  --   HCT 25.4*  --  25.9* 28.1*  --   PLT 143*  --  175 234  --   HEPARINUNFRC  --   --   --   --  0.12*  CREATININE 0.94  --  0.88 0.87  --    < > = values in this interval not displayed.    Estimated Creatinine Clearance: 88.2 mL/min (by C-G formula based on SCr of 0.87 mg/dL).   Medications:  Scheduled:  . Chlorhexidine Gluconate Cloth  6 each Topical Daily  . dextromethorphan-guaiFENesin  1 tablet Oral BID  . oxybutynin  5 mg Oral QHS  . polyethylene glycol  17 g Oral BID  . senna-docusate  1 tablet Oral BID  . sodium chloride flush  3 mL Intravenous Q12H   Infusions:  . sodium chloride 10 mL/hr at 09/09/19 0950  . heparin 1,050 Units/hr (09/10/19 0956)    Assessment: [name] presenting with chest tightness and shortness of breath. Afib with RVR noted on arrival. CTA shows bilateral pulmonary embolus. S/p post prostate surgery on 7/7.  Was started on heparin 7/11, but this was stopped on 7/13 due to CT abd showing minor hematomas and possible blood in foley. CBC improving today. Pharmacy has been consulted to restart heparin without bolus and lower goal 0.3-0.5.  Initial heparin level low at 0.12, Hg low but stable at 9.1, plt wnl (improved). No bleeding or issues with infusion per discussion with RN.  Goal of Therapy:  Heparin level 0.3-0.5 units/ml Monitor platelets by  anticoagulation protocol: Yes   Plan:  Increase heparin to 1200 units/hr Check heparin level in 6 hours Monitor daily heparin level and CBC, s/sx bleeding   Tyler [name], PharmD, BCPS Please check AMION for all [facility] contact numbers Clinical Pharmacist 09/10/2019 5:20 PM

## 2019-09-10 NOTE — Progress Notes (Signed)
[facility] PROGRESS NOTE    Progress Note  Tyler [name]  IZT:[id number] DOB: 1950-05-08 DOA: 09/05/2019 PCP: [name], FNP     Brief Narrative:   Tyler [name] is an 69 y.o. male past medical history significant for prostate cancer status post robotic assisted laparoscopic prostatectomy on 09/01/2019, essential hypertension presents to the ED for evaluation of chest pain and shortness of breath most of the history is provided by the daughter.  Patient was discharged on 05/03/2503 without complications back to the ED on the day of admission where he was found to have a pulse of 105 satting 97% cardiac biomarkers basically negative SARS-CoV-2 PCR is negative CT angio of the chest showed bilateral PE with evidence of right heart strain.  On 09/07/2019 patient had a unwitnessed syncopal episode while sitting in the bed, he was found to be diaphoretic with some loss of consciousness with a blood pressure in the 70s his mentation improve right away with it awake alert and oriented reported some abdominal discomfort, CT scan of the abdomen and pelvis showed Small amount of hematoma in the anterior pelvis as well as extensive subcutaneous emphysema, he was fluid resuscitated and transfused 1 unit of packed red blood cells.,  He was fluid resuscitated and transfused 1 unit of packed red blood cells.  Assessment/Plan:   Acute Bilateral pulmonary: Continue hold heparin, leukocytosis improving. Procalcitonin is low yield has remained afebrile chest x-ray shows no infiltrates his leukocytosis is resolved. His hemoglobin has now stabilized and is trending up, after discussing with urology we have both decided to restart him back on his heparin and monitor him for 48 hours.  Syncopal episode/hypotensionAcute blood loss anemia:: Due to spontaneous pelvic bleeding in the setting of a recent surgery with hematuria. Case discussed with Dr. Gloriann Loan on 09/07/2019 reported that CT results are expected status  post prostatectomy. Hemoglobin is stable.  KVO IV fluids. Blood cultures continue to be negative till date. He status post 1 unit of packed red blood cells. His counts have been stable actually trending up we will restart back on heparin monitor CBC daily.  Paroxysmal atrial fibrillation: Now in sinus rhythm continue to monitor on telemetry. Resume heparin see above for further details.  Essential hypertension: BP was soft due to hypotensive episode in the setting of spontaneous bleed, hold lisinopril.  Prostate cancer status post robotic assisted laparoscopy prostatectomy and bilateral pelvic node dissection on 08/26/2019: Foley catheter placed by urology with hematuria as expected, will need to continue to monitor for retention while patient on anticoagulation. Awaiting further urology recommendations.   DVT prophylaxis: scd Family Communication:none Status is: Inpatient  Remains inpatient appropriate because:Hemodynamically unstable   Dispo: The patient is from: Home              Anticipated d/c is to: Home              Anticipated d/c date is: 2 days              Patient currently is not medically stable to d/c.  Will start him on IV heparin on 09/10/2019 monitor hemoglobin for the next 48 hours.        Code Status:     Code Status Orders  (From admission, onward)         Start     Ordered   09/05/19 1920  Full code  Continuous        09/05/19 1922        Code Status History  This patient has a current code status but no historical code status.   Advance Care Planning Activity        IV Access:    Peripheral IV   Procedures and diagnostic studies:   No results found.   Medical Consultants:    None.  Anti-Infectives:   none  Subjective:    Tyler [name] no new complaints.  Objective:    Vitals:   09/10/19 0050 09/10/19 0338 09/10/19 0348 09/10/19 0818  BP: (!) 141/88 140/81  134/84  Pulse: 85 75  76  Resp: 18 18  18   Temp:  99.2 F (37.3 C) 99.4 F (37.4 C)  98.6 F (37 C)  TempSrc: Oral Oral  Oral  SpO2: 97% 92%  91%  Weight:   90 kg   Height:       SpO2: 91 %   Intake/Output Summary (Last 24 hours) at 09/10/2019 0905 Last data filed at 09/10/2019 0541 Gross per 24 hour  Intake 620 ml  Output 3327 ml  Net -2707 ml   Filed Weights   09/08/19 0119 09/09/19 0146 09/10/19 0348  Weight: 90.1 kg 91.5 kg 90 kg    Exam: General exam: In no acute distress. Respiratory system: Good air movement and clear to auscultation. Cardiovascular system: S1 & S2 heard, RRR. No JVD. Gastrointestinal system: Abdomen is nondistended, soft and nontender.  Extremities: No pedal edema. Skin: No rashes, lesions or ulcers Psychiatry: Judgement and insight appear normal. Mood & affect appropriate.   Data Reviewed:    Labs: Basic Metabolic Panel: Recent Labs  Lab 09/05/19 1419 09/05/19 1419 09/06/19 0218 09/06/19 0218 09/07/19 0111 09/07/19 0111 09/07/19 0858 09/07/19 0858 09/08/19 0614 09/08/19 0614 09/09/19 0520 09/10/19 0459  NA 139   < > 139   < > 132*  --  136  --  135  --  138 138  K 3.3*   < > 3.9   < > 3.8   < > 4.1   < > 3.8   < > 3.6 3.6  CL 103   < > 104   < > 99  --  100  --  104  --  106 103  CO2 25   < > 26   < > 24  --  25  --  24  --  26 24  GLUCOSE 129*   < > 105*   < > 111*  --  151*  --  108*  --  97 99  BUN 10   < > 11   < > 11  --  15  --  12  --  7* 5*  CREATININE 0.96   < > 1.17   < > 0.96  --  1.52*  --  0.94  --  0.88 0.87  CALCIUM 8.6*   < > 8.6*   < > 8.0*  --  7.8*  --  7.5*  --  7.7* 8.2*  MG 1.7  --  1.7  --   --   --   --   --   --   --   --   --    < > = values in this interval not displayed.   GFR Estimated Creatinine Clearance: 88.2 mL/min (by C-G formula based on SCr of 0.87 mg/dL). Liver Function Tests: Recent Labs  Lab 09/07/19 0858  AST 20  ALT 15  ALKPHOS 60  BILITOT 1.3*  PROT 5.6*  ALBUMIN 2.8*   No results for  input(s): LIPASE, AMYLASE in the last  168 hours. No results for input(s): AMMONIA in the last 168 hours. Coagulation profile No results for input(s): INR, PROTIME in the last 168 hours. COVID-19 Labs  No results for input(s): DDIMER, FERRITIN, LDH, CRP in the last 72 hours.  Lab Results  Component Value Date   SARSCOV2NAA NEGATIVE 09/05/2019   [place] NEGATIVE 08/28/2019    CBC: Recent Labs  Lab 09/07/19 0858 09/07/19 1721 09/08/19 0614 09/09/19 0520 09/10/19 0459  WBC 11.6* 13.5* 10.6* 9.2 9.5  HGB 8.9* 9.3* 8.1* 8.2* 9.1*  HCT 28.9* 29.4* 25.4* 25.9* 28.1*  MCV 88.4 87.8 88.5 89.6 88.1  PLT 184 171 143* 175 234   Cardiac Enzymes: No results for input(s): CKTOTAL, CKMB, CKMBINDEX, TROPONINI in the last 168 hours. BNP (last 3 results) No results for input(s): PROBNP in the last 8760 hours. CBG: No results for input(s): GLUCAP in the last 168 hours. D-Dimer: No results for input(s): DDIMER in the last 72 hours. Hgb A1c: No results for input(s): HGBA1C in the last 72 hours. Lipid Profile: No results for input(s): CHOL, HDL, LDLCALC, TRIG, CHOLHDL, LDLDIRECT in the last 72 hours. Thyroid function studies: No results for input(s): TSH, T4TOTAL, T3FREE, THYROIDAB in the last 72 hours.  Invalid input(s): FREET3 Anemia work up: No results for input(s): VITAMINB12, FOLATE, FERRITIN, TIBC, IRON, RETICCTPCT in the last 72 hours. Sepsis Labs: Recent Labs  Lab 09/07/19 0858 09/07/19 0858 09/07/19 1147 09/07/19 1721 09/08/19 0614 09/09/19 0520 09/10/19 0459  PROCALCITON <0.10  --   --   --  0.14 <0.10  --   WBC 11.6*   < >  --  13.5* 10.6* 9.2 9.5  LATICACIDVEN 2.5*  --  2.7*  --  0.8  --   --    < > = values in this interval not displayed.   Microbiology Recent Results (from the past 240 hour(s))  SARS Coronavirus 2 by RT PCR (hospital order, performed in [facility] hospital lab) Nasopharyngeal Nasopharyngeal Swab     Status: None   Collection Time: 09/05/19  5:37 PM   Specimen: Nasopharyngeal  Swab  Result Value Ref Range Status   SARS Coronavirus 2 NEGATIVE NEGATIVE Final    Comment: (NOTE) SARS-CoV-2 target nucleic acids are NOT DETECTED.  The SARS-CoV-2 RNA is generally detectable in upper and lower respiratory specimens during the acute phase of infection. The lowest concentration of SARS-CoV-2 viral copies this assay can detect is 250 copies / mL. A negative result does not preclude SARS-CoV-2 infection and should not be used as the sole basis for treatment or other patient management decisions.  A negative result may occur with improper specimen collection / handling, submission of specimen other than nasopharyngeal swab, presence of viral mutation(s) within the areas targeted by this assay, and inadequate number of viral copies (<250 copies / mL). A negative result must be combined with clinical observations, patient history, and epidemiological information.  Fact Sheet for Patients:   StrictlyIdeas.no  Fact Sheet for Healthcare Providers: BankingDealers.co.za  This test is not yet approved or  cleared by the [place] FDA and has been authorized for detection and/or diagnosis of SARS-CoV-2 by FDA under an Emergency Use Authorization (EUA).  This EUA will remain in effect (meaning this test can be used) for the duration of the COVID-19 declaration under Section 564(b)(1) of the Act, 21 U.S.C. section 360bbb-3(b)(1), unless the authorization is terminated or revoked sooner.  Performed at [facility], [place]  [address] [place], [place] [address]   Culture, blood (routine x 2)     Status: None (Preliminary result)   Collection Time: 09/06/19  1:04 AM   Specimen: BLOOD RIGHT HAND  Result Value Ref Range Status   Specimen Description BLOOD RIGHT HAND  Final   Special Requests   Final    BOTTLES DRAWN AEROBIC AND ANAEROBIC Blood Culture results may not be optimal due to an excessive volume of blood received in  culture bottles   Culture   Final    NO GROWTH 3 DAYS Performed at [facility], [place] [address] [place], [place] [address]    Report Status PENDING  Incomplete  Culture, blood (routine x 2)     Status: None (Preliminary result)   Collection Time: 09/06/19  2:18 AM   Specimen: BLOOD LEFT FOREARM  Result Value Ref Range Status   Specimen Description BLOOD LEFT FOREARM  Final   Special Requests   Final    BOTTLES DRAWN AEROBIC AND ANAEROBIC Blood Culture adequate volume   Culture   Final    NO GROWTH 3 DAYS Performed at [facility], [place] [address], [place], [place] [address]    Report Status PENDING  Incomplete  Culture, Urine     Status: None   Collection Time: 09/07/19  1:00 PM   Specimen: Urine, Random  Result Value Ref Range Status   Specimen Description URINE, RANDOM  Final   Special Requests NONE  Final   Culture   Final    NO GROWTH Performed at [facility], [place] [address], [place], [place] [address]    Report Status 09/08/2019 FINAL  Final     Medications:   . Chlorhexidine Gluconate Cloth  6 each Topical Daily  . dextromethorphan-guaiFENesin  1 tablet Oral BID  . oxybutynin  5 mg Oral QHS  . polyethylene glycol  17 g Oral BID  . senna-docusate  1 tablet Oral BID  . sodium chloride flush  3 mL Intravenous Q12H   Continuous Infusions: . sodium chloride 10 mL/hr at 09/09/19 0950      LOS: 4 days   Charlynne Cousins  [facility]  09/10/2019, 9:05 AM

## 2019-09-10 NOTE — Progress Notes (Signed)
Subjective/Chief Complaint:  1 - High Risk Prostate Cancer - s/p robotic prostatectomy with node dissection 09/01/19 for [id number] prostate cancer. Pre-Op staging localized. CT 7/13 with expected pelvic post-op findings with small lymphoceles on left and sub cutaneous emphysema. No large hematomas. Foley remains in situ.   2 - Bilateral Pulmonary Emboli - new bilateral PE and AFib on ER eval 7/13 on eval CP / SOB. No prior CV history. Had uncomplicated prostate surgery 7/7 that was non-prolonged and standard peri-op SCD use for DVT prevention. Appropirately placed on heparin gtt. CT 7/13 with small and expected operative area fluid.   Hgb 7/16 9.1.   Today "Tyler [name]" is stable, but febrile. Hgb 9.1, now back on heparin gtt with clear urine. Tolerating PO intake with bowel function.    Objective: Vital signs in last 24 hours: Temp:  [98.6 F (37 C)-101.3 F (38.5 C)] 98.6 F (37 C) (07/16 1233) Pulse Rate:  [68-92] 68 (07/16 1233) Resp:  [18] 18 (07/16 1233) BP: (134-148)/(79-89) 141/85 (07/16 1233) SpO2:  [91 %-98 %] 96 % (07/16 1233) Weight:  [90 kg] 90 kg (07/16 0348) Last BM Date: 09/10/19  Intake/Output from previous day: 07/15 0701 - 07/16 0700 In: 960 [P.O.:960] Out: 3327 [Urine:3325; Stool:2] Intake/Output this shift: Total I/O In: 720 [P.O.:720] Out: 2200 [Urine:2200]  General appearance: alert and cooperative Eyes: negative Nose: Nares normal. Septum midline. Mucosa normal. No drainage or sinus tenderness. Throat: lips, mucosa, and tongue normal; teeth and gums normal Neck: supple, symmetrical, trachea midline Back: symmetric, no curvature. ROM normal. No CVA tenderness. Resp: non-labored on [place] O2 at present.  Cardio: sinus at present HR low 70s.  GI: soft, non-tender; bowel sounds normal; no masses,  no organomegaly and recent surgical sites c/d/i. No r/g. No large ecchymoses.  Male genitalia: normal, foley in place with completely clear yellow urine on heparin.   Extremities: extremities normal, atraumatic, no cyanosis or edema Skin: Skin color, texture, turgor normal. No rashes or lesions Lymph nodes: Cervical, supraclavicular, and axillary nodes normal. Neurologic: Grossly normal Additional family at bedside who speak broken [place] as does patient. Daughter Tyler [name] reached by phone and discussed with all present, Tyler [name] acting to translate.   Lab Results:  Recent Labs    09/09/19 0520 09/10/19 0459  WBC 9.2 9.5  HGB 8.2* 9.1*  HCT 25.9* 28.1*  PLT 175 234   BMET Recent Labs    09/09/19 0520 09/10/19 0459  NA 138 138  K 3.6 3.6  CL 106 103  CO2 26 24  GLUCOSE 97 99  BUN 7* 5*  CREATININE 0.88 0.87  CALCIUM 7.7* 8.2*   PT/INR No results for input(s): LABPROT, INR in the last 72 hours. ABG No results for input(s): PHART, HCO3 in the last 72 hours.  Invalid input(s): PCO2, PO2  Studies/Results: No results found.  Anti-infectives: Anti-infectives (From admission, onward)   Start     Dose/Rate Route Frequency Ordered Stop   09/08/19 0030  ceFEPIme (MAXIPIME) 2 g in sodium chloride 0.9 % 100 mL IVPB  Status:  Discontinued        2 g 200 mL/hr over 30 Minutes Intravenous Every 12 hours 09/07/19 1457 09/08/19 1029   09/07/19 1230  ceFEPIme (MAXIPIME) 1 g in sodium chloride 0.9 % 100 mL IVPB  Status:  Discontinued        1 g 200 mL/hr over 30 Minutes Intravenous Every 12 hours 09/07/19 1139 09/07/19 1457      Assessment/Plan:  1 - High  Risk Prostate Cancer - no further cancer directed treatment this admission. Foley to stay now. I will likely remove mid week next week if he is still in house.   2 - Bilateral Pulmonary Emboli - appears 2/2 new or undiagnosed AFib exacerbated by physiologic peri-op hypercoagulale state. Or perhaps AFib was reactive to heart strain. Hard to tell. He has been sinus past few days which is fortunate.   NO contraindiations to heparinization as his PE's are significant. Would favor TX dose Lovenox  over coumadin or eliquus for home at least for time being at discharge given recent surgery timing in case bleeding becomes problematic. Could then hopefully bridge to more affordable coumadin or eliquus in 2-4 weeks after he is at least 4 weeks s/p major pelvic surgery.   Greatly appreciate medical team comanagement. Please call me directly with questions anytime.   I discussed path results and current plans with pt's daughter Tyler [name] who helps him make decisions and is a very pleasant and knowledgeable nurse practitioner.   Alexis Frock 09/10/2019

## 2019-09-10 NOTE — Progress Notes (Signed)
ANTICOAGULATION CONSULT NOTE - Follow Up Consult  Pharmacy Consult for heparin Indication: pulmonary embolus  Allergies  Allergen Reactions  . Amlodipine Swelling    BILATERAL FEET TO ANKLES    Patient Measurements: Height: 5\' 7"  (170.2 cm) Weight: 90 kg (198 lb 8 oz) IBW/kg (Calculated) : 66.1 Heparin Dosing Weight: 84.5 kg  Vital Signs: Temp: 98.6 F (37 C) (07/16 0818) Temp Source: Oral (07/16 0818) BP: 134/84 (07/16 0818) Pulse Rate: 76 (07/16 0818)  Labs: Recent Labs    09/07/19 1147 09/07/19 1721 09/08/19 0614 09/08/19 0614 09/09/19 0520 09/10/19 0459  HGB  --    < > 8.1*   < > 8.2* 9.1*  HCT  --    < > 25.4*  --  25.9* 28.1*  PLT  --    < > 143*  --  175 234  CREATININE  --   --  0.94  --  0.88 0.87  TROPONINIHS 6  --   --   --   --   --    < > = values in this interval not displayed.    Estimated Creatinine Clearance: 88.2 mL/min (by C-G formula based on SCr of 0.87 mg/dL).   Medications:  Scheduled:  . Chlorhexidine Gluconate Cloth  6 each Topical Daily  . dextromethorphan-guaiFENesin  1 tablet Oral BID  . oxybutynin  5 mg Oral QHS  . polyethylene glycol  17 g Oral BID  . senna-docusate  1 tablet Oral BID  . sodium chloride flush  3 mL Intravenous Q12H   Infusions:  . sodium chloride 10 mL/hr at 09/09/19 4799    Assessment: [name] presenting with chest tightness and shortness of breath. Afib with RVR noted on arrival. CTA shows bilateral pulmonary embolus. S/p post prostate surgery on 7/7.  Was started on heparin 7/11 but this was stopped on 7/13 due to CT abd showing minor hematomas and possible blood in foley. CBC improving today. Pharmacy has been consulted to restart heparin without bolus and lower goal to 0.3-0.5 units/ml.  Goal of Therapy:  Heparin level 0.3-0.5 units/ml Monitor platelets by anticoagulation protocol: Yes   Plan:  Start heparin at 1050 units/hr Check heparin level in 6 hours Daily heparin level, CBC Monitor for  bleeding  Vertis Kelch, PharmD, BCPS Phone [phone] 09/10/2019       9:17 AM  Please check [email or web address] for unit-specific pharmacist phone numbers

## 2019-09-11 LAB — CULTURE, BLOOD (ROUTINE X 2)
Culture: NO GROWTH
Culture: NO GROWTH
Special Requests: ADEQUATE

## 2019-09-11 LAB — BASIC METABOLIC PANEL WITH GFR
Anion gap: 9 (ref 5–15)
BUN: 6 mg/dL — ABNORMAL LOW (ref 8–23)
CO2: 23 mmol/L (ref 22–32)
Calcium: 8.2 mg/dL — ABNORMAL LOW (ref 8.9–10.3)
Chloride: 106 mmol/L (ref 98–111)
Creatinine, Ser: 0.89 mg/dL (ref 0.61–1.24)
GFR calc Af Amer: >60 mL/min
GFR calc non Af Amer: >60 mL/min
Glucose, Bld: 113 mg/dL — ABNORMAL HIGH (ref 70–99)
Potassium: 3.5 mmol/L (ref 3.5–5.1)
Sodium: 138 mmol/L (ref 135–145)

## 2019-09-11 LAB — CBC
HCT: 30.1 % — ABNORMAL LOW (ref 39.0–52.0)
Hemoglobin: 9.7 g/dL — ABNORMAL LOW (ref 13.0–17.0)
MCH: 28.5 pg (ref 26.0–34.0)
MCHC: 32.2 g/dL (ref 30.0–36.0)
MCV: 88.5 fL (ref 80.0–100.0)
Platelets: 283 10*3/uL (ref 150–400)
RBC: 3.4 MIL/uL — ABNORMAL LOW (ref 4.22–5.81)
RDW: 14.5 % (ref 11.5–15.5)
WBC: 10.5 10*3/uL (ref 4.0–10.5)
nRBC: 0.6 % — ABNORMAL HIGH (ref 0.0–0.2)

## 2019-09-11 LAB — HEPARIN LEVEL (UNFRACTIONATED): Heparin Unfractionated: 0.24 IU/mL — ABNORMAL LOW (ref 0.30–0.70)

## 2019-09-11 MED ORDER — APIXABAN 5 MG PO TABS
10.0000 mg | ORAL_TABLET | Freq: Two times a day (BID) | ORAL | Status: DC
  Administered 2019-09-11: 10 mg via ORAL
  Filled 2019-09-11: qty 2

## 2019-09-11 MED ORDER — APIXABAN 5 MG PO TABS: 5.0000 mg | ORAL_TABLET | Freq: Two times a day (BID) | ORAL | Status: DC

## 2019-09-11 MED ORDER — APIXABAN 5 MG PO TABS: 5.0000 mg | ORAL_TABLET | Freq: Two times a day (BID) | ORAL | 3 refills | Status: DC

## 2019-09-11 MED ORDER — OXYBUTYNIN CHLORIDE ER 5 MG PO TB24: 5.0000 mg | ORAL_TABLET | Freq: Every day | ORAL | 0 refills | Status: DC

## 2019-09-11 NOTE — Discharge Summary (Addendum)
Physician Discharge Summary  Tyler [name] LNL:[id number] DOB: 1950/06/25 DOA: 09/05/2019  PCP: [name], FNP  Admit date: 09/05/2019 Discharge date: 09/11/2019  Admitted From: Home Disposition:  Home  Recommendations for Outpatient Follow-up:  1. Follow up with Urology in 1-2 weeks 2. Please obtain BMP/CBC in one week   Home Health:No Equipment/Devices:None  Discharge Condition:Stable CODE STATUS:Full Diet recommendation: Heart Healthy  Brief/Interim Summary: 69 y.o. male past medical history significant for prostate cancer status post robotic assisted laparoscopic prostatectomy on 09/01/2019, essential hypertension presents to the ED for evaluation of chest pain and shortness of breath most of the history is provided by the daughter.  Patient was discharged on 4/0/8144 without complications back to the ED on the day of admission where he was found to have a pulse of 105 satting 97% cardiac biomarkers basically negative SARS-CoV-2 PCR is negative CT angio of the chest showed bilateral PE with evidence of right heart strain.  Discharge Diagnoses:  Principal Problem:   Bilateral pulmonary embolism (HCC) Active Problems:   Prostate cancer ([place])   Hypertension   Hypokalemia   Paroxysmal atrial fibrillation (HCC)   Pelvic hematoma in male  Acute bilateral pulmonary emboli: Had a syncopal episode, CT angio of the chest was done that showed PE. He was started on IV heparin and then transitioned to oral Eliquis which will continue as an outpatient.  Syncopal episode/hypotension/acute blood loss anemia:  On 09/07/2018 when he had an unwitnessed syncopal episode while sitting he became diaphoretic with loss of consciousness and blood pressure in the 70s. CT scan of the abdomen and pelvis was done that showed a small amount of fluid probably hematoma in the anterior pelvic wall with extensive subcutaneous emphysema. Likely due to spontaneous pelvic bleed in the setting of  prostatectomy with hematuria. Heparin was stopped he was treated conservatively, his CBC was monitored, blood cultures remain negative till date he was transfused 1 unit of packed red blood cells his hemoglobin started to trend up. He was discussed with urology and they agreed to restart IV heparin 72 hours after episode, he was monitored overnight and his hemoglobin remained stable he will go home on Eliquis.  Paroxysmal atrial fibrillation: Currently in sinus rhythm continue Eliquis as an outpatient.  Essential hypertension: Antihypertensive medications were held in the setting of spontaneous bleed, he will resume lisinopril as an outpatient.  Prostate cancer status post robotic assisted laparoscopic prostatectomy and bilateral pelvic node dissection on 08/26/2019: Foley catheter was placed by urology due to hematuria which expectedly resolved while off anticoagulation. He will follow-up with urology as an outpatient.  Discharge Instructions  Discharge Instructions    Diet - low sodium heart healthy   Complete by: As directed    Increase activity slowly   Complete by: As directed      Allergies as of 09/11/2019      Reactions   Amlodipine Swelling   BILATERAL FEET TO ANKLES      Medication List    STOP taking these medications   sulfamethoxazole-trimethoprim 800-160 MG tablet Commonly known as: BACTRIM DS     TAKE these medications   apixaban 5 MG Tabs tablet Commonly known as: ELIQUIS Take 1 tablet (5 mg total) by mouth 2 (two) times daily.   lisinopril 5 MG tablet Commonly known as: ZESTRIL Take 5 mg by mouth daily.   oxybutynin 5 MG 24 hr tablet Commonly known as: DITROPAN-XL Take 1 tablet (5 mg total) by mouth at bedtime.   oxyCODONE-acetaminophen 5-325 MG  tablet Commonly known as: Percocet Take 1 tablet by mouth every 6 (six) hours as needed for moderate pain or severe pain. Post-operatively.   senna-docusate 8.6-50 MG tablet Commonly known as:  Senokot-S Take 1 tablet by mouth 2 (two) times daily. While taking strong pain meds to prevent constipation.            Durable Medical Equipment  (From admission, onward)         Start     Ordered   09/11/19 1314  For home use only DME 4 wheeled rolling walker with seat  Once       Question:  Patient needs a walker to treat with the following condition  Answer:  Weakness   09/11/19 1314   09/11/19 1306  For home use only DME 3 n 1  Once        09/11/19 1305          Follow-up Information    Ntibrey, Garen Lah, FNP.   Specialty: Family Medicine Contact information: [address] [place] [address] [phone]        Care, [facility] Follow up.   Specialty: Home Health Services Contact information: [place] [address] [phone]              Allergies  Allergen Reactions  . Amlodipine Swelling    BILATERAL FEET TO ANKLES    Consultations:  Urology   Procedures/Studies: CT Angio Chest PE W and/or Wo Contrast  Addendum Date: 09/05/2019   ADDENDUM REPORT: 09/05/2019 17:14 ADDENDUM: These results were called by telephone at the time of interpretation on 09/05/2019 at 5:14 pm to provider JOSHUA LONG , who verbally acknowledged these results. Electronically Signed   By: Constance Holster M.D.   On: 09/05/2019 17:14   Result Date: 09/05/2019 CLINICAL DATA:  PE suspected. EXAM: CT ANGIOGRAPHY CHEST WITH CONTRAST TECHNIQUE: Multidetector CT imaging of the chest was performed using the standard protocol during bolus administration of intravenous contrast. Multiplanar CT image reconstructions and MIPs were obtained to evaluate the vascular anatomy. CONTRAST:  52mL OMNIPAQUE IOHEXOL 350 MG/ML SOLN COMPARISON:  CT dated May 28 21 FINDINGS: Cardiovascular: Contrast injection is sufficient to demonstrate satisfactory opacification of the pulmonary arteries to the segmental level. There are acute bilateral lobar, segmental, and  subsegmental pulmonary emboli. The size of the main pulmonary artery is normal. There is CT evidence for right-sided heart strain with an RV LV ratio measuring approximately 1. The heart is enlarged. There is no significant pericardial effusion. There is reflux of contrast in the IVC. Mediastinum/Nodes: -- No mediastinal lymphadenopathy. -- No hilar lymphadenopathy. -- No axillary lymphadenopathy. -- No supraclavicular lymphadenopathy. -- Normal thyroid gland where visualized. -  Unremarkable esophagus. Lungs/Pleura: There is atelectasis at the lung bases. There is no pneumothorax. No significant pleural effusion. Upper Abdomen: Contrast bolus timing is not optimized for evaluation of the abdominal organs. There appears to be a nonobstructing stone in the upper pole the right kidney. Subcutaneous gas is noted along the patient's upper abdomen, presumably related to the patient's recent robotic assisted laparoscopic radical prostatectomy. Musculoskeletal: No chest wall abnormality. No bony spinal canal stenosis. Review of the MIP images confirms the above findings. IMPRESSION: 1. There are acute bilateral lobar, segmental, and subsegmental pulmonary emboli. There is CT evidence for right-sided heart strain with an RV/LV ratio measuring approximately 1. 2. There is atelectasis at the lung bases. 3. Subcutaneous gas is noted along the patient's upper abdomen,  presumably related to the patient's recent robotic assisted laparoscopic radical prostatectomy. 4. Cardiomegaly. Electronically Signed: By: Constance Holster M.D. On: 09/05/2019 17:05   CT ABDOMEN PELVIS W CONTRAST  Result Date: 09/07/2019 CLINICAL DATA:  69 year old male with abdominal distension. Recent prostatectomy on 09/01/2019. Now with acute onset severe abdominal and flank pain. EXAM: CT ABDOMEN AND PELVIS WITH CONTRAST TECHNIQUE: Multidetector CT imaging of the abdomen and pelvis was performed using the standard protocol following bolus  administration of intravenous contrast. CONTRAST:  179mL OMNIPAQUE IOHEXOL 300 MG/ML  SOLN COMPARISON:  CT abdomen pelvis dated 07/23/2019. FINDINGS: Lower chest: Minimal bibasilar atelectasis. The visualized lung bases are otherwise clear. Several small pockets of air along the anterior pelvic peritoneum, likely related to recent surgery. Diffuse pelvic stranding and small amount of free fluid within the pelvis as well as small perihepatic ascites. There is small amount of hematoma in the anterior pelvis measuring approximately 3 x 5 cm (70/3). Hepatobiliary: Several subcentimeter hepatic hypodense foci are too small to characterize. The liver is otherwise unremarkable. No intrahepatic biliary ductal dilatation. The gallbladder is unremarkable. Pancreas: Unremarkable. No pancreatic ductal dilatation or surrounding inflammatory changes. Spleen: Normal in size without focal abnormality. Adrenals/Urinary Tract: The adrenal glands are unremarkable. There is no hydronephrosis on either side. There is symmetric enhancement and excretion of contrast by both kidneys. Several small bilateral renal cysts. The visualized ureters appear unremarkable. The urinary bladder is decompressed around a Foley catheter. Stomach/Bowel: There is no bowel obstruction or active inflammation. The appendix is normal. Vascular/Lymphatic: The abdominal aorta and IVC are unremarkable. No portal venous gas. Several ovoid structures along the pelvic sidewalls and along the course of the external iliac vessels noted measuring up to 2.7 x 2.7 cm on the left (70/3). These appear to have layering high attenuating content or contrast material and suspicious for a small hematomas although vascular injuries or pseudoaneurysm are not excluded. Follow-up recommended. There is no adenopathy. Reproductive: Prostatectomy. The seminal vesicles are unremarkable. Other: There is extensive subcutaneous emphysema extending from the scrotal wall to the anterior  pelvic wall and anterior abdominal wall. Musculoskeletal: No acute or significant osseous findings. IMPRESSION: 1. Status post prostatectomy. Small amount of hematoma in the anterior pelvis as well as extensive subcutaneous emphysema. 2. Several ovoid structures along the pelvic sidewalls and along the course of the external iliac vessels suspicious for a small hematomas versus vascular injuries or pseudoaneurysm. Follow-up recommended. 3. No bowel obstruction. Normal appendix. Electronically Signed   By: Anner Crete M.D.   On: 09/07/2019 02:26   DG Chest Port 1 View  Result Date: 09/07/2019 CLINICAL DATA:  Dyspnea. EXAM: PORTABLE CHEST 1 VIEW COMPARISON:  September 05, 2019. FINDINGS: The heart size and mediastinal contours are within normal limits. Both lungs are clear. No pneumothorax or pleural effusion is noted. The visualized skeletal structures are unremarkable. IMPRESSION: No active disease. Electronically Signed   By: Marijo Conception M.D.   On: 09/07/2019 08:49   DG Chest Port 1 View  Result Date: 09/05/2019 CLINICAL DATA:  Shortness of breath EXAM: PORTABLE CHEST 1 VIEW COMPARISON:  February 25, 2019 FINDINGS: The heart size and mediastinal contours are within normal limits. Both lungs are clear. The visualized skeletal structures are unremarkable. IMPRESSION: No active disease. Electronically Signed   By: Dorise Bullion III M.D   On: 09/05/2019 13:45   ECHOCARDIOGRAM COMPLETE  Result Date: 09/06/2019    ECHOCARDIOGRAM REPORT   Patient Name:   Tyler [name] Date of Exam: 09/06/2019  Medical Rec #:  [id number]    Height:       67.0 in Accession #:    [id number]   Weight:       196.0 lb Date of Birth:  11/07/1950    BSA:          2.005 m Patient Age:    17 years     BP:           136/88 mmHg Patient Gender: M            HR:           74 bpm. Exam Location:  Inpatient Procedure: 2D Echo Indications:    pulmonary embolus 415.19  History:        Patient has no prior history of Echocardiogram  examinations.                 Arrythmias:paroxysmal a-fib; Risk Factors:Hypertension.  Sonographer:    Johny Chess Referring Phys: [id number] [place]  1. Left ventricular ejection fraction, by estimation, is 70 to 75%. The left ventricle has hyperdynamic function. The left ventricle has no regional wall motion abnormalities. Left ventricular diastolic parameters are consistent with Grade I diastolic dysfunction (impaired relaxation).  2. Right ventricular systolic function is normal. The right ventricular size is normal.  3. The mitral valve is normal in structure. No evidence of mitral valve regurgitation. No evidence of mitral stenosis.  4. The aortic valve is normal in structure. Aortic valve regurgitation is not visualized. Mild aortic valve sclerosis is present, with no evidence of aortic valve stenosis.  5. The inferior vena cava is normal in size with greater than 50% respiratory variability, suggesting right atrial pressure of 3 mmHg. FINDINGS  Left Ventricle: Left ventricular ejection fraction, by estimation, is 70 to 75%. The left ventricle has hyperdynamic function. The left ventricle has no regional wall motion abnormalities. The left ventricular internal cavity size was normal in size. There is no left ventricular hypertrophy. Left ventricular diastolic parameters are consistent with Grade I diastolic dysfunction (impaired relaxation). Right Ventricle: The right ventricular size is normal.Right ventricular systolic function is normal. Left Atrium: Left atrial size was normal in size. Right Atrium: Right atrial size was normal in size. Pericardium: There is no evidence of pericardial effusion. Mitral Valve: The mitral valve is normal in structure. Normal mobility of the mitral valve leaflets. No evidence of mitral valve regurgitation. No evidence of mitral valve stenosis. Tricuspid Valve: The tricuspid valve is normal in structure. Tricuspid valve regurgitation is trivial. No  evidence of tricuspid stenosis. Aortic Valve: The aortic valve is normal in structure. Aortic valve regurgitation is not visualized. Mild aortic valve sclerosis is present, with no evidence of aortic valve stenosis. Pulmonic Valve: The pulmonic valve was normal in structure. Pulmonic valve regurgitation is not visualized. No evidence of pulmonic stenosis. Aorta: The aortic root is normal in size and structure. Venous: The inferior vena cava is normal in size with greater than 50% respiratory variability, suggesting right atrial pressure of 3 mmHg. IAS/Shunts: No atrial level shunt detected by color flow Doppler.  LEFT VENTRICLE PLAX 2D LVIDd:         4.30 cm  Diastology LVIDs:         2.50 cm  LV e' lateral:   9.57 cm/s LV PW:         1.10 cm  LV E/e' lateral: 6.6 LV IVS:        1.10 cm  LV e' medial:  7.40 cm/s LVOT diam:     2.00 cm  LV E/e' medial:  8.6 LV SV:         66 LV SV Index:   33 LVOT Area:     3.14 cm  RIGHT VENTRICLE             IVC RV S prime:     18.90 cm/s  IVC diam: 1.70 cm LEFT ATRIUM             Index       RIGHT ATRIUM          Index LA diam:        3.10 cm 1.55 cm/m  RA Area:     7.24 cm LA Vol (A2C):   26.5 ml 13.22 ml/m RA Volume:   10.20 ml 5.09 ml/m LA Vol (A4C):   39.8 ml 19.85 ml/m LA Biplane Vol: 33.6 ml 16.76 ml/m  AORTIC VALVE LVOT Vmax:   120.00 cm/s LVOT Vmean:  82.000 cm/s LVOT VTI:    0.210 m  AORTA Ao Root diam: 3.10 cm Ao Asc diam:  3.20 cm MITRAL VALVE MV Area (PHT): 2.69 cm    SHUNTS MV Decel Time: 282 msec    Systemic VTI:  0.21 m MV E velocity: 63.40 cm/s  Systemic Diam: 2.00 cm MV A velocity: 75.40 cm/s MV E/A ratio:  0.84 Kirk Ruths MD Electronically signed by Kirk Ruths MD Signature Date/Time: 09/06/2019/1:49:57 PM    Final    VAS [place] LOWER EXTREMITY VENOUS (DVT)  Result Date: 09/07/2019  Lower Venous DVT Study Indications: Pulmonary embolism, and Edema.  Risk Factors: Cancer Prostate S/P prostatectomy 09/01/19. Limitations: Bandages and Foley. Comparison  Study: No prior study on file for comparison Performing Technologist: Sharion Dove RVS  Examination Guidelines: A complete evaluation includes B-mode imaging, spectral Doppler, color Doppler, and power Doppler as needed of all accessible portions of each vessel. Bilateral testing is considered an integral part of a complete examination. Limited examinations for reoccurring indications may be performed as noted. The reflux portion of the exam is performed with the patient in reverse Trendelenburg.  +---------+---------------+---------+-----------+----------+--------------+ RIGHT    CompressibilityPhasicitySpontaneityPropertiesThrombus Aging +---------+---------------+---------+-----------+----------+--------------+ CFV      Full           Yes      Yes                                 +---------+---------------+---------+-----------+----------+--------------+ SFJ      Full                                                        +---------+---------------+---------+-----------+----------+--------------+ FV Prox  Full                                                        +---------+---------------+---------+-----------+----------+--------------+ FV Mid   Full                                                        +---------+---------------+---------+-----------+----------+--------------+  FV Distal                                             Not visualized +---------+---------------+---------+-----------+----------+--------------+ PFV      Full                                                        +---------+---------------+---------+-----------+----------+--------------+ POP      Full           Yes      Yes                                 +---------+---------------+---------+-----------+----------+--------------+ PTV      Full                                                        +---------+---------------+---------+-----------+----------+--------------+  PERO     Full                                                        +---------+---------------+---------+-----------+----------+--------------+   +---------+---------------+---------+-----------+----------+--------------+ LEFT     CompressibilityPhasicitySpontaneityPropertiesThrombus Aging +---------+---------------+---------+-----------+----------+--------------+ CFV      Full           Yes      Yes                                 +---------+---------------+---------+-----------+----------+--------------+ SFJ      Full                                                        +---------+---------------+---------+-----------+----------+--------------+ FV Prox  Full                                                        +---------+---------------+---------+-----------+----------+--------------+ FV Mid   Full                                                        +---------+---------------+---------+-----------+----------+--------------+ FV DistalFull                                                        +---------+---------------+---------+-----------+----------+--------------+  PFV      Full                                                        +---------+---------------+---------+-----------+----------+--------------+ POP      Full           Yes      Yes                                 +---------+---------------+---------+-----------+----------+--------------+ PTV      Full                                                        +---------+---------------+---------+-----------+----------+--------------+ PERO     Full                                                        +---------+---------------+---------+-----------+----------+--------------+     Summary: RIGHT: - There is no evidence of deep vein thrombosis in the lower extremity. However, portions of this examination were limited- see technologist comments above.  LEFT: - There is no  evidence of deep vein thrombosis in the lower extremity.  *See table(s) above for measurements and observations. Electronically signed by Ruta Hinds MD on 09/07/2019 at 10:09:01 AM.    Final     Subjective: No complaints feels great.  Discharge Exam: Vitals:   09/11/19 0758 09/11/19 1119  BP: (!) 142/83 140/87  Pulse: 73 64  Resp: 17 16  Temp: 99.2 F (37.3 C) 98.2 F (36.8 C)  SpO2: 97% 98%   Vitals:   09/11/19 0432 09/11/19 0441 09/11/19 0758 09/11/19 1119  BP: (!) 145/85  (!) 142/83 140/87  Pulse: 75  73 64  Resp: 18  17 16   Temp: 99.3 F (37.4 C)  99.2 F (37.3 C) 98.2 F (36.8 C)  TempSrc: Oral  Oral Oral  SpO2: 92%  97% 98%  Weight:  86.9 kg    Height:        General: Pt is alert, awake, not in acute distress Cardiovascular: RRR, S1/S2 +, no rubs, no gallops Respiratory: CTA bilaterally, no wheezing, no rhonchi Abdominal: Soft, NT, ND, bowel sounds + Extremities: no edema, no cyanosis    The results of significant diagnostics from this hospitalization (including imaging, microbiology, ancillary and laboratory) are listed below for reference.     Microbiology: Recent Results (from the past 240 hour(s))  SARS Coronavirus 2 by RT PCR (hospital order, performed in [facility] hospital lab) Nasopharyngeal Nasopharyngeal Swab     Status: None   Collection Time: 09/05/19  5:37 PM   Specimen: Nasopharyngeal Swab  Result Value Ref Range Status   SARS Coronavirus 2 NEGATIVE NEGATIVE Final    Comment: (NOTE) SARS-CoV-2 target nucleic acids are NOT DETECTED.  The SARS-CoV-2 RNA is generally detectable in upper and lower respiratory specimens during the acute phase of infection. The lowest concentration of SARS-CoV-2 viral copies this assay can detect is  250 copies / mL. A negative result does not preclude SARS-CoV-2 infection and should not be used as the sole basis for treatment or other patient management decisions.  A negative result may occur with improper  specimen collection / handling, submission of specimen other than nasopharyngeal swab, presence of viral mutation(s) within the areas targeted by this assay, and inadequate number of viral copies (<250 copies / mL). A negative result must be combined with clinical observations, patient history, and epidemiological information.  Fact Sheet for Patients:   StrictlyIdeas.no  Fact Sheet for Healthcare Providers: BankingDealers.co.za  This test is not yet approved or  cleared by the [place] FDA and has been authorized for detection and/or diagnosis of SARS-CoV-2 by FDA under an Emergency Use Authorization (EUA).  This EUA will remain in effect (meaning this test can be used) for the duration of the COVID-19 declaration under Section 564(b)(1) of the Act, 21 U.S.C. section 360bbb-3(b)(1), unless the authorization is terminated or revoked sooner.  Performed at [facility], [place] [address], [place], [place] [address]   Culture, blood (routine x 2)     Status: None   Collection Time: 09/06/19  1:04 AM   Specimen: BLOOD RIGHT HAND  Result Value Ref Range Status   Specimen Description BLOOD RIGHT HAND  Final   Special Requests   Final    BOTTLES DRAWN AEROBIC AND ANAEROBIC Blood Culture results may not be optimal due to an excessive volume of blood received in culture bottles   Culture   Final    NO GROWTH 5 DAYS Performed at [facility], [place] [address], [place], [place] [address]    Report Status 09/11/2019 FINAL  Final  Culture, blood (routine x 2)     Status: None   Collection Time: 09/06/19  2:18 AM   Specimen: BLOOD LEFT FOREARM  Result Value Ref Range Status   Specimen Description BLOOD LEFT FOREARM  Final   Special Requests   Final    BOTTLES DRAWN AEROBIC AND ANAEROBIC Blood Culture adequate volume   Culture   Final    NO GROWTH 5 DAYS Performed at [facility], [place] [address], [place], [place] [address]     Report Status 09/11/2019 FINAL  Final  Culture, Urine     Status: None   Collection Time: 09/07/19  1:00 PM   Specimen: Urine, Random  Result Value Ref Range Status   Specimen Description URINE, RANDOM  Final   Special Requests NONE  Final   Culture   Final    NO GROWTH Performed at [facility], [place] [address], [place], [place] [address]    Report Status 09/08/2019 FINAL  Final     Labs: BNP (last 3 results) No results for input(s): BNP in the last 8760 hours. Basic Metabolic Panel: Recent Labs  Lab 09/05/19 1419 09/05/19 1419 09/06/19 0218 09/07/19 0111 09/07/19 0858 09/08/19 0614 09/09/19 0520 09/10/19 0459 09/11/19 0018  NA 139   < > 139   < > 136 135 138 138 138  K 3.3*   < > 3.9   < > 4.1 3.8 3.6 3.6 3.5  CL 103   < > 104   < > 100 104 106 103 106  CO2 25   < > 26   < > 25 24 26 24 23   GLUCOSE 129*   < > 105*   < > 151* 108* 97 99 113*  BUN 10   < >  11   < > 15 12 7* 5* 6*  CREATININE 0.96   < > 1.17   < > 1.52* 0.94 0.88 0.87 0.89  CALCIUM 8.6*   < > 8.6*   < > 7.8* 7.5* 7.7* 8.2* 8.2*  MG 1.7  --  1.7  --   --   --   --   --   --    < > = values in this interval not displayed.   Liver Function Tests: Recent Labs  Lab 09/07/19 0858  AST 20  ALT 15  ALKPHOS 60  BILITOT 1.3*  PROT 5.6*  ALBUMIN 2.8*   No results for input(s): LIPASE, AMYLASE in the last 168 hours. No results for input(s): AMMONIA in the last 168 hours. CBC: Recent Labs  Lab 09/07/19 1721 09/08/19 0614 09/09/19 0520 09/10/19 0459 09/11/19 0018  WBC 13.5* 10.6* 9.2 9.5 10.5  HGB 9.3* 8.1* 8.2* 9.1* 9.7*  HCT 29.4* 25.4* 25.9* 28.1* 30.1*  MCV 87.8 88.5 89.6 88.1 88.5  PLT 171 143* 175 234 283   Cardiac Enzymes: No results for input(s): CKTOTAL, CKMB, CKMBINDEX, TROPONINI in the last 168 hours. BNP: Invalid input(s): POCBNP CBG: No results for input(s): GLUCAP in the last 168 hours. D-Dimer No results for input(s): DDIMER in the last 72 hours. Hgb A1c No results  for input(s): HGBA1C in the last 72 hours. Lipid Profile No results for input(s): CHOL, HDL, LDLCALC, TRIG, CHOLHDL, LDLDIRECT in the last 72 hours. Thyroid function studies No results for input(s): TSH, T4TOTAL, T3FREE, THYROIDAB in the last 72 hours.  Invalid input(s): FREET3 Anemia work up No results for input(s): VITAMINB12, FOLATE, FERRITIN, TIBC, IRON, RETICCTPCT in the last 72 hours. Urinalysis No results found for: COLORURINE, APPEARANCEUR, [address], [place], [place], [place], [place], [place], PROTEINUR, UROBILINOGEN, NITRITE, LEUKOCYTESUR Sepsis Labs Invalid input(s): PROCALCITONIN,  WBC,  LACTICIDVEN Microbiology Recent Results (from the past 240 hour(s))  SARS Coronavirus 2 by RT PCR (hospital order, performed in [facility] hospital lab) Nasopharyngeal Nasopharyngeal Swab     Status: None   Collection Time: 09/05/19  5:37 PM   Specimen: Nasopharyngeal Swab  Result Value Ref Range Status   SARS Coronavirus 2 NEGATIVE NEGATIVE Final    Comment: (NOTE) SARS-CoV-2 target nucleic acids are NOT DETECTED.  The SARS-CoV-2 RNA is generally detectable in upper and lower respiratory specimens during the acute phase of infection. The lowest concentration of SARS-CoV-2 viral copies this assay can detect is 250 copies / mL. A negative result does not preclude SARS-CoV-2 infection and should not be used as the sole basis for treatment or other patient management decisions.  A negative result may occur with improper specimen collection / handling, submission of specimen other than nasopharyngeal swab, presence of viral mutation(s) within the areas targeted by this assay, and inadequate number of viral copies (<250 copies / mL). A negative result must be combined with clinical observations, patient history, and epidemiological information.  Fact Sheet for Patients:   StrictlyIdeas.no  Fact Sheet for Healthcare  Providers: BankingDealers.co.za  This test is not yet approved or  cleared by the [place] FDA and has been authorized for detection and/or diagnosis of SARS-CoV-2 by FDA under an Emergency Use Authorization (EUA).  This EUA will remain in effect (meaning this test can be used) for the duration of the COVID-19 declaration under Section 564(b)(1) of the Act, 21 U.S.C. section 360bbb-3(b)(1), unless the authorization is terminated or revoked sooner.  Performed at [facility], [place]  [address] [place], [place] [address]   Culture, blood (routine x 2)     Status: None   Collection Time: 09/06/19  1:04 AM   Specimen: BLOOD RIGHT HAND  Result Value Ref Range Status   Specimen Description BLOOD RIGHT HAND  Final   Special Requests   Final    BOTTLES DRAWN AEROBIC AND ANAEROBIC Blood Culture results may not be optimal due to an excessive volume of blood received in culture bottles   Culture   Final    NO GROWTH 5 DAYS Performed at [facility], [place] [address] [place], [place] [address]    Report Status 09/11/2019 FINAL  Final  Culture, blood (routine x 2)     Status: None   Collection Time: 09/06/19  2:18 AM   Specimen: BLOOD LEFT FOREARM  Result Value Ref Range Status   Specimen Description BLOOD LEFT FOREARM  Final   Special Requests   Final    BOTTLES DRAWN AEROBIC AND ANAEROBIC Blood Culture adequate volume   Culture   Final    NO GROWTH 5 DAYS Performed at [facility], [place] [address], [place], [place] [address]    Report Status 09/11/2019 FINAL  Final  Culture, Urine     Status: None   Collection Time: 09/07/19  1:00 PM   Specimen: Urine, Random  Result Value Ref Range Status   Specimen Description URINE, RANDOM  Final   Special Requests NONE  Final   Culture   Final    NO GROWTH Performed at [facility], [place] [address] [place], [place] [address]    Report Status 09/08/2019 FINAL  Final     Time coordinating  discharge: Over 30 minutes  SIGNED:   Charlynne Cousins, MD  [facility] 09/11/2019, 2:07 PM Pager   If 7PM-7AM, please contact night-coverage [email or web address] Password [email or web address]

## 2019-09-11 NOTE — Progress Notes (Signed)
Discharge and medication education given to pt and daughter Tyler [name].  HHPT, 3in1 and Rollator needs communicated with SW and MD.  Prescriptions in discharging packet.  Questions and concerns answered.

## 2019-09-11 NOTE — Progress Notes (Signed)
3in1 at bedside. Rollator will be delivered at pts home , daughter aware.  Pt's belongings with daughter and pt clothes, bag, shoes.  Peripheral iv removed and dressing applied.

## 2019-09-11 NOTE — Care Management (Signed)
Provided patient with 30 day Eliquis card in room.  Spoke with his daughter Tyler [name] as well and explained use to her too.

## 2019-09-11 NOTE — Progress Notes (Signed)
ANTICOAGULATION CONSULT NOTE - Initial Consult  Pharmacy Consult for Eliquis Indication: pulmonary embolus  Allergies  Allergen Reactions   Amlodipine Swelling    BILATERAL FEET TO ANKLES    Patient Measurements: Height: 5\' 7"  (170.2 cm) Weight: 86.9 kg (191 lb 9 oz) IBW/kg (Calculated) : 66.1 Heparin Dosing Weight: 84.5 kg  Vital Signs: Temp: 99.2 F (37.3 C) (07/17 0758) Temp Source: Oral (07/17 0758) BP: 142/83 (07/17 0758) Pulse Rate: 73 (07/17 0758)  Labs: Recent Labs    09/09/19 0520 09/09/19 0520 09/10/19 0459 09/10/19 1606 09/11/19 0018  HGB 8.2*   < > 9.1*  --  9.7*  HCT 25.9*  --  28.1*  --  30.1*  PLT 175  --  234  --  283  HEPARINUNFRC  --   --   --  0.12* 0.24*  CREATININE 0.88  --  0.87  --  0.89   < > = values in this interval not displayed.    Estimated Creatinine Clearance: 84.8 mL/min (by C-G formula based on SCr of 0.89 mg/dL).   Medications:  Scheduled:   Chlorhexidine Gluconate Cloth  6 each Topical Daily   dextromethorphan-guaiFENesin  1 tablet Oral BID   oxybutynin  5 mg Oral QHS   polyethylene glycol  17 g Oral BID   senna-docusate  1 tablet Oral BID   sodium chloride flush  3 mL Intravenous Q12H   Infusions:   sodium chloride 10 mL/hr at 09/09/19 5374    Assessment: [name] presenting with chest tightness and shortness of breath. Afib with RVR noted on arrival. CTA shows bilateral pulmonary embolus. S/p post prostate surgery on 7/7.  Was started on heparin 7/11, but this was stopped on 7/13 due to CT abd showing minor hematomas and possible blood in foley. Heparin was restarted 7/16 for a total of 4 days of heparin therapy. CBC improving. Pharmacy has been consulted to start Eliquis.  Due to patient's bleed risk and 4 days of heparin therapy, will shorten load of Eliquis to 2 days.   Goal of Therapy:  Monitor platelets by anticoagulation protocol: Yes   Plan:  Stop heparin Start Eliquis 10mg  twice daily x2 days,  followed by 5mg  twice daily Monitor daily CBC, s/sx bleeding  Romilda Garret, PharmD PGY1 Acute Care Pharmacy Resident Phone: [phone] 09/11/2019 9:34 AM  Please check [email or web address] for unit specific pharmacy phone numbers.

## 2019-09-11 NOTE — Progress Notes (Signed)
ANTICOAGULATION CONSULT NOTE - Follow Up Consult  Pharmacy Consult for heparin Indication: pulmonary embolus  Allergies  Allergen Reactions  . Amlodipine Swelling    BILATERAL FEET TO ANKLES    Patient Measurements: Height: 5\' 7"  (170.2 cm) Weight: 90 kg (198 lb 8 oz) IBW/kg (Calculated) : 66.1 Heparin Dosing Weight: 84.5 kg  Vital Signs: Temp: 98.7 F (37.1 C) (07/17 0027) Temp Source: Oral (07/17 0027) BP: 144/87 (07/17 0027) Pulse Rate: 64 (07/17 0027)  Labs: Recent Labs    09/09/19 0520 09/09/19 0520 09/10/19 0459 09/10/19 1606 09/11/19 0018  HGB 8.2*   < > 9.1*  --  9.7*  HCT 25.9*  --  28.1*  --  30.1*  PLT 175  --  234  --  283  HEPARINUNFRC  --   --   --  0.12* 0.24*  CREATININE 0.88  --  0.87  --  0.89   < > = values in this interval not displayed.    Estimated Creatinine Clearance: 86.2 mL/min (by C-G formula based on SCr of 0.89 mg/dL).   Medications:  Scheduled:  . Chlorhexidine Gluconate Cloth  6 each Topical Daily  . dextromethorphan-guaiFENesin  1 tablet Oral BID  . oxybutynin  5 mg Oral QHS  . polyethylene glycol  17 g Oral BID  . senna-docusate  1 tablet Oral BID  . sodium chloride flush  3 mL Intravenous Q12H   Infusions:  . sodium chloride 10 mL/hr at 09/09/19 0950  . heparin 1,200 Units/hr (09/10/19 1752)    Assessment: [name] presenting with chest tightness and shortness of breath. Afib with RVR noted on arrival. CTA shows bilateral pulmonary embolus. S/p post prostate surgery on 7/7.  Was started on heparin 7/11, but this was stopped on 7/13 due to CT abd showing minor hematomas and possible blood in foley. CBC improving today. Pharmacy has been consulted to restart heparin without bolus and lower goal 0.3-0.5.  7/17 AM update:  Heparin level low but trending up Hgb 9.1>>9.7  Goal of Therapy:  Heparin level 0.3-0.5 units/ml Monitor platelets by anticoagulation protocol: Yes   Plan:  Increase heparin to 1350 units/hr Check  heparin level in 6-8 hours Monitor daily heparin level and CBC, s/sx bleeding  Narda Bonds, PharmD, BCPS Clinical Pharmacist Phone: [phone]

## 2019-09-11 NOTE — Progress Notes (Addendum)
[facility] PROGRESS NOTE    Progress Note  Tyler [name]  GYJ:[id number] DOB: 1950/08/27 DOA: 09/05/2019 PCP: [name], FNP     Brief Narrative:   Tyler [name] is an 69 y.o. male past medical history significant for prostate cancer status post robotic assisted laparoscopic prostatectomy on 09/01/2019, essential hypertension presents to the ED for evaluation of chest pain and shortness of breath most of the history is provided by the daughter.  Patient was discharged on 04/03/3783 without complications back to the ED on the day of admission where he was found to have a pulse of 105 satting 97% cardiac biomarkers basically negative SARS-CoV-2 PCR is negative CT angio of the chest showed bilateral PE with evidence of right heart strain.  On 09/07/2019 patient had a unwitnessed syncopal episode while sitting in the bed, he was found to be diaphoretic with some loss of consciousness with a blood pressure in the 70s his mentation improve right away with it awake alert and oriented reported some abdominal discomfort, CT scan of the abdomen and pelvis showed Small amount of hematoma in the anterior pelvis as well as extensive subcutaneous emphysema, he was fluid resuscitated and transfused 1 unit of packed red blood cells.,  He was fluid resuscitated and transfused 1 unit of packed red blood cells.  Assessment/Plan:   Acute Bilateral pulmonary: Now on heparin, hemoglobin is trending up no signs of bleeding he denies any pain. Sats have been greater 90% on room air. His hemoglobin has been stable transition him to Eliquis per pharmacy.  Syncopal episode/hypotensionAcute blood loss anemia:: Due to spontaneous pelvic bleeding in the setting of a recent surgery with hematuria. Case discussed with Dr. Bess Harvest on 09/10/2019 reported that CT results are expected status post prostatectomy. Blood cultures continue to be negative till date. He status post 1 unit of packed red blood cells. Transition  him to Eliquis per pharmacy.  Paroxysmal atrial fibrillation: Now in sinus rhythm continue to monitor on telemetry. Transition him to Eliquis per pharmacy.  Essential hypertension: BP was soft due to hypotensive episode in the setting of spontaneous bleed, hold lisinopril.  Prostate cancer status post robotic assisted laparoscopy prostatectomy and bilateral pelvic node dissection on 08/26/2019: Foley catheter placed by urology with hematuria as expected, will need to continue to monitor for retention while patient on anticoagulation. Appreciate assistance no contraindication to anticoagulation.   DVT prophylaxis: scd Family Communication:none Status is: Inpatient  Remains inpatient appropriate because:Hemodynamically unstable   Dispo: The patient is from: Home              Anticipated d/c is to: Home              Anticipated d/c date is: 2 days              Patient currently is not medically stable to d/c.  Will start him on IV heparin on 09/10/2019 monitor hemoglobin for the next 48 hours.        Code Status:     Code Status Orders  (From admission, onward)         Start     Ordered   09/05/19 1920  Full code  Continuous        09/05/19 1922        Code Status History    This patient has a current code status but no historical code status.   Advance Care Planning Activity        IV Access:    Peripheral  IV   Procedures and diagnostic studies:   No results found.   Medical Consultants:    None.  Anti-Infectives:   none  Subjective:    Tyler [name] no new complains.  Objective:    Vitals:   09/11/19 0027 09/11/19 0432 09/11/19 0441 09/11/19 0758  BP: (!) 144/87 (!) 145/85  (!) 142/83  Pulse: 64 75  73  Resp: 19 18  17   Temp: 98.7 F (37.1 C) 99.3 F (37.4 C)  99.2 F (37.3 C)  TempSrc: Oral Oral  Oral  SpO2: 94% 92%  97%  Weight:   86.9 kg   Height:       SpO2: 97 %   Intake/Output Summary (Last 24 hours) at 09/11/2019  0827 Last data filed at 09/11/2019 0442 Gross per 24 hour  Intake 1034.93 ml  Output 3950 ml  Net -2915.07 ml   Filed Weights   09/09/19 0146 09/10/19 0348 09/11/19 0441  Weight: 91.5 kg 90 kg 86.9 kg    Exam: General exam: In no acute distress. Respiratory system: Good air movement and clear to auscultation. Cardiovascular system: S1 & S2 heard, RRR. No JVD. Gastrointestinal system: Abdomen is nondistended, soft and nontender.  Extremities: No pedal edema. Skin: No rashes, lesions or ulcers Psychiatry: Judgement and insight appear normal. Mood & affect appropriate.   Data Reviewed:    Labs: Basic Metabolic Panel: Recent Labs  Lab 09/05/19 1419 09/05/19 1419 09/06/19 0218 09/07/19 0111 09/07/19 0858 09/07/19 0858 09/08/19 2878 09/08/19 6767 09/09/19 0520 09/09/19 0520 09/10/19 0459 09/11/19 0018  NA 139   < > 139   < > 136  --  135  --  138  --  138 138  K 3.3*   < > 3.9   < > 4.1   < > 3.8   < > 3.6   < > 3.6 3.5  CL 103   < > 104   < > 100  --  104  --  106  --  103 106  CO2 25   < > 26   < > 25  --  24  --  26  --  24 23  GLUCOSE 129*   < > 105*   < > 151*  --  108*  --  97  --  99 113*  BUN 10   < > 11   < > 15  --  12  --  7*  --  5* 6*  CREATININE 0.96   < > 1.17   < > 1.52*  --  0.94  --  0.88  --  0.87 0.89  CALCIUM 8.6*   < > 8.6*   < > 7.8*  --  7.5*  --  7.7*  --  8.2* 8.2*  MG 1.7  --  1.7  --   --   --   --   --   --   --   --   --    < > = values in this interval not displayed.   GFR Estimated Creatinine Clearance: 84.8 mL/min (by C-G formula based on SCr of 0.89 mg/dL). Liver Function Tests: Recent Labs  Lab 09/07/19 0858  AST 20  ALT 15  ALKPHOS 60  BILITOT 1.3*  PROT 5.6*  ALBUMIN 2.8*   No results for input(s): LIPASE, AMYLASE in the last 168 hours. No results for input(s): AMMONIA in the last 168 hours. Coagulation profile No results for input(s): INR, PROTIME in the last  168 hours. COVID-19 Labs  No results for input(s):  DDIMER, FERRITIN, LDH, CRP in the last 72 hours.  Lab Results  Component Value Date   SARSCOV2NAA NEGATIVE 09/05/2019   [place] NEGATIVE 08/28/2019    CBC: Recent Labs  Lab 09/07/19 1721 09/08/19 0614 09/09/19 0520 09/10/19 0459 09/11/19 0018  WBC 13.5* 10.6* 9.2 9.5 10.5  HGB 9.3* 8.1* 8.2* 9.1* 9.7*  HCT 29.4* 25.4* 25.9* 28.1* 30.1*  MCV 87.8 88.5 89.6 88.1 88.5  PLT 171 143* 175 234 283   Cardiac Enzymes: No results for input(s): CKTOTAL, CKMB, CKMBINDEX, TROPONINI in the last 168 hours. BNP (last 3 results) No results for input(s): PROBNP in the last 8760 hours. CBG: No results for input(s): GLUCAP in the last 168 hours. D-Dimer: No results for input(s): DDIMER in the last 72 hours. Hgb A1c: No results for input(s): HGBA1C in the last 72 hours. Lipid Profile: No results for input(s): CHOL, HDL, LDLCALC, TRIG, CHOLHDL, LDLDIRECT in the last 72 hours. Thyroid function studies: No results for input(s): TSH, T4TOTAL, T3FREE, THYROIDAB in the last 72 hours.  Invalid input(s): FREET3 Anemia work up: No results for input(s): VITAMINB12, FOLATE, FERRITIN, TIBC, IRON, RETICCTPCT in the last 72 hours. Sepsis Labs: Recent Labs  Lab 09/07/19 0858 09/07/19 1147 09/07/19 1721 09/08/19 0614 09/09/19 0520 09/10/19 0459 09/11/19 0018  PROCALCITON <0.10  --   --  0.14 <0.10  --   --   WBC 11.6*  --    < > 10.6* 9.2 9.5 10.5  LATICACIDVEN 2.5* 2.7*  --  0.8  --   --   --    < > = values in this interval not displayed.   Microbiology Recent Results (from the past 240 hour(s))  SARS Coronavirus 2 by RT PCR (hospital order, performed in [facility] hospital lab) Nasopharyngeal Nasopharyngeal Swab     Status: None   Collection Time: 09/05/19  5:37 PM   Specimen: Nasopharyngeal Swab  Result Value Ref Range Status   SARS Coronavirus 2 NEGATIVE NEGATIVE Final    Comment: (NOTE) SARS-CoV-2 target nucleic acids are NOT DETECTED.  The SARS-CoV-2 RNA is generally  detectable in upper and lower respiratory specimens during the acute phase of infection. The lowest concentration of SARS-CoV-2 viral copies this assay can detect is 250 copies / mL. A negative result does not preclude SARS-CoV-2 infection and should not be used as the sole basis for treatment or other patient management decisions.  A negative result may occur with improper specimen collection / handling, submission of specimen other than nasopharyngeal swab, presence of viral mutation(s) within the areas targeted by this assay, and inadequate number of viral copies (<250 copies / mL). A negative result must be combined with clinical observations, patient history, and epidemiological information.  Fact Sheet for Patients:   StrictlyIdeas.no  Fact Sheet for Healthcare Providers: BankingDealers.co.za  This test is not yet approved or  cleared by the [place] FDA and has been authorized for detection and/or diagnosis of SARS-CoV-2 by FDA under an Emergency Use Authorization (EUA).  This EUA will remain in effect (meaning this test can be used) for the duration of the COVID-19 declaration under Section 564(b)(1) of the Act, 21 U.S.C. section 360bbb-3(b)(1), unless the authorization is terminated or revoked sooner.  Performed at [facility], [place] [address] [place], [place] [address]   Culture, blood (routine x 2)     Status: None (Preliminary result)   Collection Time: 09/06/19  1:04 AM  Specimen: BLOOD RIGHT HAND  Result Value Ref Range Status   Specimen Description BLOOD RIGHT HAND  Final   Special Requests   Final    BOTTLES DRAWN AEROBIC AND ANAEROBIC Blood Culture results may not be optimal due to an excessive volume of blood received in culture bottles   Culture   Final    NO GROWTH 4 DAYS Performed at [facility], [place] [address] [place], [place] [address]    Report Status PENDING  Incomplete  Culture, blood  (routine x 2)     Status: None (Preliminary result)   Collection Time: 09/06/19  2:18 AM   Specimen: BLOOD LEFT FOREARM  Result Value Ref Range Status   Specimen Description BLOOD LEFT FOREARM  Final   Special Requests   Final    BOTTLES DRAWN AEROBIC AND ANAEROBIC Blood Culture adequate volume   Culture   Final    NO GROWTH 4 DAYS Performed at [facility], [place] [address], [place], [place] [address]    Report Status PENDING  Incomplete  Culture, Urine     Status: None   Collection Time: 09/07/19  1:00 PM   Specimen: Urine, Random  Result Value Ref Range Status   Specimen Description URINE, RANDOM  Final   Special Requests NONE  Final   Culture   Final    NO GROWTH Performed at [facility], [place] [address] [place], [place] [address]    Report Status 09/08/2019 FINAL  Final     Medications:   . Chlorhexidine Gluconate Cloth  6 each Topical Daily  . dextromethorphan-guaiFENesin  1 tablet Oral BID  . oxybutynin  5 mg Oral QHS  . polyethylene glycol  17 g Oral BID  . senna-docusate  1 tablet Oral BID  . sodium chloride flush  3 mL Intravenous Q12H   Continuous Infusions: . sodium chloride 10 mL/hr at 09/09/19 0950  . heparin 1,350 Units/hr (09/11/19 0449)      LOS: 5 days   Charlynne Cousins  [facility]  09/11/2019, 8:27 AM

## 2019-09-11 NOTE — TOC Transition Note (Addendum)
Transition of Care [facility]) - CM/SW Discharge Note   Patient Details  Name: Tyler [name] MRN: [id number] Date of Birth: [birth year]  [facility]) CM/SW Contact:  Carles Collet, RN Phone Number: 09/11/2019, 1:06 PM   Clinical Narrative:    [place] services arranged through [facility] 3/1 and rollator to be delivered to room prior to DC.    Final next level of care: [place] Barriers to Discharge: No Barriers Identified   Patient Goals and CMS Choice Patient states their goals for this hospitalization and ongoing recovery are:: to go home CMS Medicare.gov Compare Post Acute Care list provided to:: Patient Choice offered to / list presented to : Patient  Discharge Placement                       Discharge Plan and Services                DME Arranged: 3-N-1 DME Agency: [employer] Date DME Agency Contacted: 09/11/19 Time DME Agency Contacted: 36 Representative spoke with at DME Agency: [place]: PT, OT [place] Agency: [place] Date [place]: 09/11/19 Time [place]: [place] Representative spoke with at [place]: [place] ([place]) Interventions     Readmission Risk Interventions No flowsheet data found.

## 2021-11-18 ENCOUNTER — Encounter (HOSPITAL_COMMUNITY): Payer: Self-pay | Admitting: *Deleted

## 2021-11-18 ENCOUNTER — Ambulatory Visit (HOSPITAL_COMMUNITY)
Admission: EM | Admit: 2021-11-18 | Discharge: 2021-11-18 | Disposition: A | Discharge status: Home / Self Care | Payer: Self-pay | Attending: Physician Assistant | Admitting: Physician Assistant

## 2021-11-18 ENCOUNTER — Ambulatory Visit (INDEPENDENT_AMBULATORY_CARE_PROVIDER_SITE_OTHER): Payer: Self-pay

## 2021-11-18 DIAGNOSIS — M5431 Sciatica, right side: Secondary | ICD-10-CM

## 2021-11-18 DIAGNOSIS — M545 Low back pain, unspecified: Secondary | ICD-10-CM

## 2021-11-18 DIAGNOSIS — M5432 Sciatica, left side: Secondary | ICD-10-CM

## 2021-11-18 MED ORDER — BACLOFEN 5 MG PO TABS: 5.0000 mg | ORAL_TABLET | Freq: Two times a day (BID) | ORAL | 0 refills | Status: DC

## 2021-11-18 NOTE — Discharge Instructions (Signed)
I am concerned that you have something called sciatica.  Please continue your ibuprofen for pain relief.  You can alternate this with Tylenol.  Use heat and gentle stretch for symptom relief.  I have called in baclofen.  You can take this up to twice a day.  This can make you sleepy so do not drive or drink alcohol taking it.  Ultimately, I think you need to see a specialist.  Please call to schedule an appointment with [facility].  If you have any worsening symptoms including increased pain, weakness, going to the bathroom on yourself without noticing it, trouble walking you need to be seen immediately.

## 2021-11-18 NOTE — ED Triage Notes (Signed)
2 wks after prostatectomy in 2021, started with right thigh pain. Over past 3 wks, has started with [place], burning pain from right heel radiating up entire posterior RLE. Works on his feet for 8 hr shifts. Pain worse throughout the day after walking. Difficulty sleeping due to pain; wakes with parasthesias in RLE that last approx 30 min. Has tried '800mg'$  IBU BID with little relief until afternoon. Pt states he stopped Eliquis 2 months ago - discussed with urologist this month who told him to stop due to hematuria.

## 2021-11-18 NOTE — ED Provider Notes (Signed)
[place]    CSN: [id number] Arrival date & time: 11/18/21  1608      History   Chief Complaint No chief complaint on file.   HPI Tyler [name] is a 71 y.o. male.   Patient presents today accompanied by his daughter who help provide the majority of history as well as translation.  He has a history of prostate cancer and had a prostatectomy in 2021.  After that he had shooting pain in his right leg.  After a few months this resolved but he has had a recurrent of similar but more extreme symptoms prompting evaluation.  Pain occurs almost every day and is worse when he is at work as he has a physical job working at a [employer] where he has to be on his feet for 8 hours at a time.  He reports pain is primarily localized to his right leg but will have some symptoms in his left, described as numbness/tingling/shooting, no alleviating factors identified.  He has tried ibuprofen with improvement but not resolution of symptoms.  Reports that the ibuprofen provides only temporary relief and he often wakes up at night due to symptoms.  He does have Eliquis listed on his chart but is not currently taking this as it was stopped due to hematuria by his urologist several months ago.  He has not seen orthopedic provider.  Denies any known trauma or previous surgery involving his back.  Denies any bowel/bladder incontinence; has some ongoing urine leakage but this has been present since prostatectomy and is unchanged from baseline.  Denies any lower extremity weakness or saddle anesthesia.    Past Medical History:  Diagnosis Date   Cancer [facility])    prostate   Hypertension     Patient Active Problem List   Diagnosis Date Noted   Pelvic hematoma in male 09/08/2019   Bilateral pulmonary embolism ([place]) 09/05/2019   Paroxysmal atrial fibrillation ([place]) 09/05/2019   Hypertension    Hypokalemia    Prostate cancer ([place]) 09/01/2019    Past Surgical History:  Procedure Laterality Date    LYMPHADENECTOMY Bilateral 09/01/2019   Procedure: LYMPHADENECTOMY;  Surgeon: Alexis Frock, MD;  Location: [facility];  Service: Urology;  Laterality: Bilateral;   ROBOT ASSISTED LAPAROSCOPIC RADICAL PROSTATECTOMY N/A 09/01/2019   Procedure: XI ROBOTIC ASSISTED LAPAROSCOPIC RADICAL PROSTATECTOMY;  Surgeon: Alexis Frock, MD;  Location: [facility];  Service: Urology;  Laterality: N/A;  3 HRS       Home Medications    Prior to Admission medications   Medication Sig Start Date End Date Taking? Authorizing Provider  baclofen 5 MG TABS Take 5 mg by mouth 2 (two) times daily. 11/18/21  Yes Lance Huaracha K, PA-C  lisinopril (ZESTRIL) 5 MG tablet Take 5 mg by mouth daily.   Yes [provider]    Family History Family History  Problem Relation Age of Onset   Stroke Father     Social History Social History   Tobacco Use   Smoking status: Never   Smokeless tobacco: Never  Vaping Use   Vaping Use: Never used  Substance Use Topics   Alcohol use: Not Currently   Drug use: Never     Allergies   Amlodipine   Review of Systems Review of Systems  Constitutional:  Positive for activity change. Negative for appetite change, fatigue and fever.  Gastrointestinal:  Negative for abdominal pain, diarrhea, nausea and vomiting.  Musculoskeletal:  Positive for myalgias. Negative for arthralgias, back pain and neck  pain.  Neurological:  Positive for numbness. Negative for dizziness, weakness, light-headedness and headaches.     Physical Exam Triage Vital Signs ED Triage Vitals  Enc Vitals Group     BP 11/18/21 1723 (!) 151/84     Pulse Rate 11/18/21 1723 77     Resp 11/18/21 1723 20     Temp 11/18/21 1723 99.2 F (37.3 C)     Temp Source 11/18/21 1723 Oral     SpO2 11/18/21 1723 99 %     Weight --      Height --      Head Circumference --      Peak Flow --      Pain Score 11/18/21 1725 0     Pain Loc --      Pain Edu? --      Excl. in [place]? --    No data found.  Updated  Vital Signs BP (!) 151/84   Pulse 77   Temp 99.2 F (37.3 C) (Oral)   Resp 20   SpO2 99%   Visual Acuity Right Eye Distance:   Left Eye Distance:   Bilateral Distance:    Right Eye Near:   Left Eye Near:    Bilateral Near:     Physical Exam Vitals reviewed.  Constitutional:      General: He is awake.     Appearance: Normal appearance. He is well-developed. He is not ill-appearing.     Comments: Very pleasant male appears stated age in no acute distress sitting comfortably in exam room  HENT:     Head: Normocephalic and atraumatic.     Mouth/Throat:     Pharynx: Uvula midline. No oropharyngeal exudate or posterior oropharyngeal erythema.  Cardiovascular:     Rate and Rhythm: Normal rate and regular rhythm.     Heart sounds: Normal heart sounds, S1 normal and S2 normal. No murmur heard. Pulmonary:     Effort: Pulmonary effort is normal.     Breath sounds: Normal breath sounds. No stridor. No wheezing, rhonchi or rales.     Comments: Clear to auscultation bilaterally Musculoskeletal:     Cervical back: No tenderness or bony tenderness.     Thoracic back: No tenderness or bony tenderness.     Lumbar back: No tenderness or bony tenderness. Normal range of motion. Negative right straight leg raise test and negative left straight leg raise test.     Right lower leg: No tenderness or bony tenderness.     Left lower leg: No tenderness or bony tenderness.     Comments: Back: No pain percussion of vertebrae.  No tenderness palpation of paraspinal muscles.  Negative straight leg raise and [place].  No significant tenderness palpation or deformity noted with palpation of posterior leg.  Strength 5/5 bilateral lower extremities.  Neurological:     Mental Status: He is alert.  Psychiatric:        Behavior: Behavior is cooperative.      UC Treatments / Results  Labs (all labs ordered are listed, but only abnormal results are displayed) Labs Reviewed - No data to  display  EKG   Radiology DG Lumbar Spine Complete  Result Date: 11/18/2021 CLINICAL DATA:  Lumbosacral back pain and radiculopathy. EXAM: LUMBAR SPINE - COMPLETE 4+ VIEW COMPARISON:  CT of the abdomen September 07, 2019 FINDINGS: There is no evidence of lumbar spine fracture.  Alignment is normal. Multilevel osteoarthritic changes with disc space narrowing, endplate sclerosis and osteophytosis. Moderate posterior facet arthropathy  in the lower lumbosacral spine. IMPRESSION: 1. Multilevel osteoarthritic changes. 2. Moderate posterior facet arthropathy in the lower lumbosacral spine. Electronically Signed   By: Fidela Salisbury M.D.   On: 11/18/2021 18:16    Procedures Procedures (including critical care time)  Medications Ordered in UC Medications - No data to display  Initial Impression / Assessment and Plan / UC Course  I have reviewed the triage vital signs and the nursing notes.  Pertinent labs & imaging results that were available during my care of the patient were reviewed by me and considered in my medical decision making (see chart for details).     Patient is well-appearing, afebrile, nontoxic, nontachycardic.  Denies any red flag signs or symptoms.  X-ray was obtained given history of cancer which showed degenerative disc disease but otherwise was normal with no evidence of malignancy.  Patient was encouraged to continue over-the-counter medications including Tylenol and ibuprofen.  He was started on baclofen for additional symptom relief with instruction not to drive or drink alcohol with taking this medication as drowsiness is a common side effect.  He is to use to keep and stretch.  Discussed that ultimately he will likely need to see a specialist and was given contact information for orthopedic provider with instruction to call to schedule an appointment.  Recommend close follow-up with his primary care.  Discussed that if he has any worsening symptoms including increased pain,  bowel/bladder incontinence, lower extremity weakness, saddle anesthesia he needs to go to the emergency room.  Strict return precautions given.  Work excuse note provided.   Final Clinical Impressions(s) / UC Diagnoses   Final diagnoses:  Bilateral sciatica     Discharge Instructions      I am concerned that you have something called sciatica.  Please continue your ibuprofen for pain relief.  You can alternate this with Tylenol.  Use heat and gentle stretch for symptom relief.  I have called in baclofen.  You can take this up to twice a day.  This can make you sleepy so do not drive or drink alcohol taking it.  Ultimately, I think you need to see a specialist.  Please call to schedule an appointment with [facility].  If you have any worsening symptoms including increased pain, weakness, going to the bathroom on yourself without noticing it, trouble walking you need to be seen immediately.     ED Prescriptions     Medication Sig Dispense Auth. Provider   baclofen 5 MG TABS Take 5 mg by mouth 2 (two) times daily. 20 tablet Russell Engelstad, Derry Skill, PA-C      PDMP not reviewed this encounter.   Terrilee Croak, PA-C 11/18/21 1820

## 2021-11-24 DIAGNOSIS — R051 Acute cough: Secondary | ICD-10-CM | POA: Diagnosis not present

## 2021-11-24 DIAGNOSIS — Z03818 Encounter for observation for suspected exposure to other biological agents ruled out: Secondary | ICD-10-CM | POA: Diagnosis not present

## 2021-11-27 DIAGNOSIS — R600 Localized edema: Secondary | ICD-10-CM | POA: Diagnosis not present

## 2021-11-27 DIAGNOSIS — I10 Essential (primary) hypertension: Secondary | ICD-10-CM | POA: Diagnosis not present

## 2021-11-27 DIAGNOSIS — N400 Enlarged prostate without lower urinary tract symptoms: Secondary | ICD-10-CM | POA: Diagnosis not present

## 2021-11-28 ENCOUNTER — Ambulatory Visit (HOSPITAL_COMMUNITY)
Admission: EM | Admit: 2021-11-28 | Discharge: 2021-11-28 | Disposition: A | Discharge status: Home / Self Care | Payer: 59 | Attending: Nurse Practitioner | Admitting: Nurse Practitioner

## 2021-11-28 ENCOUNTER — Encounter (HOSPITAL_COMMUNITY): Payer: Self-pay | Admitting: Emergency Medicine

## 2021-11-28 ENCOUNTER — Emergency Department (HOSPITAL_COMMUNITY): Payer: 59

## 2021-11-28 ENCOUNTER — Emergency Department (HOSPITAL_COMMUNITY)
Admission: EM | Admit: 2021-11-28 | Discharge: 2021-11-29 | Disposition: A | Discharge status: Home / Self Care | Payer: 59 | Attending: Emergency Medicine | Admitting: Emergency Medicine

## 2021-11-28 ENCOUNTER — Other Ambulatory Visit: Payer: Self-pay

## 2021-11-28 DIAGNOSIS — N281 Cyst of kidney, acquired: Secondary | ICD-10-CM | POA: Diagnosis not present

## 2021-11-28 DIAGNOSIS — R109 Unspecified abdominal pain: Secondary | ICD-10-CM | POA: Diagnosis not present

## 2021-11-28 DIAGNOSIS — R112 Nausea with vomiting, unspecified: Secondary | ICD-10-CM

## 2021-11-28 DIAGNOSIS — R1013 Epigastric pain: Secondary | ICD-10-CM | POA: Insufficient documentation

## 2021-11-28 DIAGNOSIS — R1011 Right upper quadrant pain: Secondary | ICD-10-CM

## 2021-11-28 LAB — COMPREHENSIVE METABOLIC PANEL
ALT: 36 U/L (ref 0–44)
AST: 37 U/L (ref 15–41)
Albumin: 2.8 g/dL — ABNORMAL LOW (ref 3.5–5.0)
Alkaline Phosphatase: 98 U/L (ref 38–126)
Anion gap: 9 (ref 5–15)
BUN: 15 mg/dL (ref 8–23)
CO2: 23 mmol/L (ref 22–32)
Calcium: 8.9 mg/dL (ref 8.9–10.3)
Chloride: 105 mmol/L (ref 98–111)
Creatinine, Ser: 1.07 mg/dL (ref 0.61–1.24)
GFR, Estimated: >60 mL/min (ref >60)
Glucose, Bld: 100 mg/dL — ABNORMAL HIGH (ref 70–99)
Potassium: 4.9 mmol/L (ref 3.5–5.1)
Sodium: 137 mmol/L (ref 135–145)
Total Bilirubin: 1.6 mg/dL — ABNORMAL HIGH (ref 0.3–1.2)
Total Protein: 6.8 g/dL (ref 6.5–8.1)

## 2021-11-28 LAB — CBC WITH DIFFERENTIAL/PLATELET
Abs Immature Granulocytes: 0.06 10*3/uL (ref 0.00–0.07)
Basophils Absolute: 0 10*3/uL (ref 0.0–0.1)
Basophils Relative: 0 %
Eosinophils Absolute: 0 10*3/uL (ref 0.0–0.5)
Eosinophils Relative: 0 %
HCT: 27.5 % — ABNORMAL LOW (ref 39.0–52.0)
Hemoglobin: 9.2 g/dL — ABNORMAL LOW (ref 13.0–17.0)
Immature Granulocytes: 1 %
Lymphocytes Relative: 32 %
Lymphs Abs: 1.7 10*3/uL (ref 0.7–4.0)
MCH: 25.1 pg — ABNORMAL LOW (ref 26.0–34.0)
MCHC: 33.5 g/dL (ref 30.0–36.0)
MCV: 74.9 fL — ABNORMAL LOW (ref 80.0–100.0)
Monocytes Absolute: 1 10*3/uL (ref 0.1–1.0)
Monocytes Relative: 20 %
Neutro Abs: 2.4 10*3/uL (ref 1.7–7.7)
Neutrophils Relative %: 47 %
Platelets: 75 10*3/uL — ABNORMAL LOW (ref 150–400)
RBC: 3.67 MIL/uL — ABNORMAL LOW (ref 4.22–5.81)
RDW: 19.6 % — ABNORMAL HIGH (ref 11.5–15.5)
WBC: 5.2 10*3/uL (ref 4.0–10.5)
nRBC: 2.7 % — ABNORMAL HIGH (ref 0.0–0.2)

## 2021-11-28 LAB — LIPASE, BLOOD: Lipase: 28 U/L (ref 11–51)

## 2021-11-28 NOTE — ED Notes (Signed)
Spoke to [employer], NP about patient.  Patient will be seen at [facility] by Edison Pace, NP

## 2021-11-28 NOTE — Discharge Instructions (Signed)
Recommend going to ED for CT scan due to his symptoms and history

## 2021-11-28 NOTE — ED Provider Triage Note (Signed)
Emergency Medicine Provider Triage Evaluation Note  Tyler [name] , [name] 71 y.o. male  was evaluated in triage.  Pt complains of abdominal pain.  Located to diffuse right abdomen, flank.  Also noted "red" urine.  Also noted some pain to bilateral feet.  No swelling.  Review of Systems  Positive: Abd pain, nausea Negative:   Physical Exam  BP 139/87 (BP Location: Right Arm)   Pulse (!) 107   Temp 98.8 F (37.1 C) (Oral)   Resp 19   SpO2 99%  Gen:   Awake, no distress   Resp:  Normal effort  MSK:   Moves extremities without difficulty  Other:    Medical Decision Making  Medically screening exam initiated at 4:26 PM.  Appropriate orders placed.  Tyler [name] was informed that the remainder of the evaluation will be completed by another provider, this initial triage assessment does not replace that evaluation, and the importance of remaining in the ED until their evaluation is complete.  Abd pain, hematuria    Tyler Noy A, PA-C 11/28/21 1626

## 2021-11-28 NOTE — ED Provider Notes (Signed)
Tyler [name]    CSN: [id number] Arrival date & time: 11/28/21  1354      History   Chief Complaint Chief Complaint  Patient presents with   Emesis    HPI Tyler [name] is a 71 y.o. male.   HPI He is in today complaining of continued abdominal pain with emesis. He  has a past medical history of Cancer ([place]) and Hypertension. He was seen on yesterday by his PCP and had labs. He has had 2 episodes of emesis with sweats. He has had some increased effort with breathing. He denies chest pain. He continues to have 8/10 RUQ abdominal pain. He reports that the pain brings him to tears. He is unable to eat the sight of food makes him nauseated. He has to try to walk the pain off. The sweats seem to come on after the abdominal pain subsides. He denies constipation but also reports that it hard to tell with is abdomen being this size.  He continues to have right sciatic pain  Past Medical History:  Diagnosis Date   Cancer [facility])    prostate   Hypertension     Patient Active Problem List   Diagnosis Date Noted   Pelvic hematoma in male 09/08/2019   Bilateral pulmonary embolism ([place]) 09/05/2019   Paroxysmal atrial fibrillation ([place]) 09/05/2019   Hypertension    Hypokalemia    Prostate cancer ([place]) 09/01/2019    Past Surgical History:  Procedure Laterality Date   LYMPHADENECTOMY Bilateral 09/01/2019   Procedure: LYMPHADENECTOMY;  Surgeon: Alexis Frock, MD;  Location: [facility];  Service: Urology;  Laterality: Bilateral;   ROBOT ASSISTED LAPAROSCOPIC RADICAL PROSTATECTOMY N/A 09/01/2019   Procedure: XI ROBOTIC ASSISTED LAPAROSCOPIC RADICAL PROSTATECTOMY;  Surgeon: Alexis Frock, MD;  Location: [facility];  Service: Urology;  Laterality: N/A;  3 HRS       Home Medications    Prior to Admission medications   Medication Sig Start Date End Date Taking? Authorizing Provider  baclofen 5 MG TABS Take 5 mg by mouth 2 (two) times daily. 11/18/21   Raspet, Erin K, PA-C  lisinopril  (ZESTRIL) 5 MG tablet Take 5 mg by mouth daily.    [provider]    Family History Family History  Problem Relation Age of Onset   Stroke Father     Social History Social History   Tobacco Use   Smoking status: Never   Smokeless tobacco: Never  Vaping Use   Vaping Use: Never used  Substance Use Topics   Alcohol use: Not Currently   Drug use: Never     Allergies   Amlodipine   Review of Systems Review of Systems   Physical Exam Triage Vital Signs ED Triage Vitals  Enc Vitals Group     BP 11/28/21 1405 126/73     Pulse Rate 11/28/21 1405 (!) 105     Resp 11/28/21 1405 (!) 27     Temp 11/28/21 1405 (!) 100.5 F (38.1 C)     Temp Source 11/28/21 1405 Oral     SpO2 11/28/21 1405 98 %     Weight --      Height --      Head Circumference --      Peak Flow --      Pain Score 11/28/21 1402 9     Pain Loc --      Pain Edu? --      Excl. in [place]? --    No data found. His EKG  today was normal sinus rhythm.  Updated Vital Signs BP 126/73 (BP Location: Right Arm)   Pulse (!) 105   Temp (!) 100.5 F (38.1 C) (Oral)   Resp (!) 27   SpO2 98%   Visual Acuity Right Eye Distance:   Left Eye Distance:   Bilateral Distance:    Right Eye Near:   Left Eye Near:    Bilateral Near:     Physical Exam Constitutional:      General: He is not in acute distress.    Appearance: He is obese. He is not ill-appearing, toxic-appearing or diaphoretic.  HENT:     Head: Normocephalic and atraumatic.     Nose: Nose normal.     Mouth/Throat:     Mouth: Mucous membranes are moist.  Cardiovascular:     Rate and Rhythm: Normal rate and regular rhythm.     Pulses: Normal pulses.     Heart sounds: Normal heart sounds.  Pulmonary:     Effort: Pulmonary effort is normal.     Breath sounds: Normal breath sounds.  Abdominal:     General: Bowel sounds are absent.     Tenderness: There is abdominal tenderness in the right upper quadrant. There is no right CVA tenderness  or left CVA tenderness.     Hernia: No hernia is present.     Comments: Increased abdominal girth   Musculoskeletal:        General: Normal range of motion.     Cervical back: Normal range of motion.  Skin:    General: Skin is warm and dry.     Capillary Refill: Capillary refill takes less than 2 seconds.  Neurological:     General: No focal deficit present.     Mental Status: He is alert and oriented to person, place, and time.  Psychiatric:        Mood and Affect: Mood normal.        Behavior: Behavior normal.      UC Treatments / Results  Labs (all labs ordered are listed, but only abnormal results are displayed) Labs Reviewed - No data to display  EKG   Radiology No results found.  Procedures Procedures (including critical care time)  Medications Ordered in UC Medications - No data to display  Initial Impression / Assessment and Plan / UC Course  I have reviewed the triage vital signs and the nursing notes.  Pertinent labs & imaging results that were available during my care of the patient were reviewed by me and considered in my medical decision making (see chart for details).    Vomiting   Abdominal pain Final Clinical Impressions(s) / UC Diagnoses   Final diagnoses:  RUQ abdominal pain  Nausea and vomiting, unspecified vomiting type     Discharge Instructions      Recommend going to ED for CT scan due to his symptoms and history    ED Prescriptions   None    PDMP not reviewed this encounter.   Tyler [name] [place], [place] 11/28/21 [phone]

## 2021-11-28 NOTE — ED Notes (Signed)
Patient is being discharged from the Urgent Care and sent to the Emergency Department via [facility] . Per Dionisio David , NP, patient is in need of higher level of care due to limited resources/suspect need for CT. Patient is aware and verbalizes understanding of plan of care.  Vitals:   11/28/21 1405  BP: 126/73  Pulse: (!) 105  Resp: (!) 27  Temp: (!) 100.5 F (38.1 C)  SpO2: 98%

## 2021-11-28 NOTE — ED Triage Notes (Signed)
Patient here with complaint of right sided abdominal pain and "red urine". Patient is alert, oriented, and in no apparent distress at this time.

## 2021-11-28 NOTE — ED Triage Notes (Addendum)
Vomiting for 2 days.  Having back and abdominal pain. Particularly painful area to right epigastric area.   Denies diarrhea.  Vomited x 2 today.  Complains of weakness.  Last bm was this morning.

## 2021-11-28 NOTE — ED Notes (Signed)
When pt blows his nose he has a lot of blood coming out of his nose.

## 2021-11-29 ENCOUNTER — Emergency Department (HOSPITAL_COMMUNITY): Payer: 59

## 2021-11-29 DIAGNOSIS — R109 Unspecified abdominal pain: Secondary | ICD-10-CM | POA: Diagnosis not present

## 2021-11-29 DIAGNOSIS — N281 Cyst of kidney, acquired: Secondary | ICD-10-CM | POA: Diagnosis not present

## 2021-11-29 LAB — URINALYSIS, ROUTINE W REFLEX MICROSCOPIC
Bilirubin Urine: NEGATIVE
Glucose, UA: NEGATIVE mg/dL
Hgb urine dipstick: NEGATIVE
Ketones, ur: NEGATIVE mg/dL
Leukocytes,Ua: NEGATIVE
Nitrite: NEGATIVE
Protein, ur: NEGATIVE mg/dL
Specific Gravity, Urine: >1.046 — ABNORMAL HIGH (ref 1.005–1.030)
pH: 5 (ref 5.0–8.0)

## 2021-11-29 MED ORDER — ONDANSETRON 4 MG PO TBDP: ORAL_TABLET | ORAL | 0 refills | Status: DC

## 2021-11-29 MED ORDER — ONDANSETRON 4 MG PO TBDP
4.0000 mg | ORAL_TABLET | Freq: Once | ORAL | Status: AC
  Administered 2021-11-29: 4 mg via ORAL
  Filled 2021-11-29: qty 1

## 2021-11-29 MED ORDER — ALUM & MAG HYDROXIDE-SIMETH 200-200-20 MG/5ML PO SUSP
30.0000 mL | Freq: Once | ORAL | Status: AC
  Administered 2021-11-29: 30 mL via ORAL
  Filled 2021-11-29: qty 30

## 2021-11-29 MED ORDER — IOHEXOL 350 MG/ML SOLN
75.0000 mL | Freq: Once | INTRAVENOUS | Status: AC | PRN
  Administered 2021-11-29: 75 mL via INTRAVENOUS

## 2021-11-29 NOTE — Discharge Instructions (Addendum)
Try pepcid or tagamet up to twice a day.  Try to avoid things that may make this worse, most commonly these are spicy foods tomato based products fatty foods chocolate and peppermint.  Alcohol and tobacco can also make this worse.  Return to the emergency department for sudden worsening pain fever or inability to eat or drink.  

## 2021-11-29 NOTE — ED Notes (Signed)
Pt requesting pancreatic function test as part of his work-up. Stated that he had a past history of drinking and would like to know how the blood work would come back as.

## 2021-11-29 NOTE — ED Provider Notes (Signed)
[facility] EMERGENCY DEPARTMENT Provider Note   CSN: [id number] Arrival date & time: 11/28/21  1509     History  Chief Complaint  Patient presents with   Abdominal Pain    Tyler [name] is a 71 y.o. male.  71 yo M with a chief complaints of abdominal pain.'s been going on for about a week now.  Describes it as sharp seems to come and go.  Worse to the upper and the right upper part of his abdomen.  Not exertional has had some nausea and vomiting with this.  He denies trauma denies fevers denies diarrhea.  Denies urinary symptoms.  He does drink alcohol quite a bit and so there is some concern by the patient and family that it was related to that.   Abdominal Pain      Home Medications Prior to Admission medications   Medication Sig Start Date End Date Taking? Authorizing Provider  ondansetron (ZOFRAN-ODT) 4 MG disintegrating tablet '4mg'$  ODT q4 hours prn nausea/vomit 11/29/21  Yes Deno Etienne, DO  baclofen 5 MG TABS Take 5 mg by mouth 2 (two) times daily. 11/18/21   Raspet, Erin K, PA-C  lisinopril (ZESTRIL) 5 MG tablet Take 5 mg by mouth daily.    [provider]      Allergies    Amlodipine    Review of Systems   Review of Systems  Gastrointestinal:  Positive for abdominal pain.    Physical Exam Updated Vital Signs BP 129/79   Pulse 89   Temp 98.4 F (36.9 C) (Oral)   Resp 19   SpO2 100%  Physical Exam Vitals and nursing note reviewed.  Constitutional:      Appearance: He is well-developed.  HENT:     Head: Normocephalic and atraumatic.  Eyes:     Pupils: Pupils are equal, round, and reactive to light.  Neck:     Vascular: No JVD.  Cardiovascular:     Rate and Rhythm: Normal rate and regular rhythm.     Heart sounds: No murmur heard.    No friction rub. No gallop.  Pulmonary:     Effort: No respiratory distress.     Breath sounds: No wheezing.  Abdominal:     General: There is distension.     Tenderness: There is no  abdominal tenderness. There is no guarding or rebound.     Comments: Abdomen appears to be distended according to patient that is somewhat normal.  No obvious tenderness on exam.  No pain with deep palpation of the right upper quadrant negative Murphy sign  Musculoskeletal:        General: Normal range of motion.     Cervical back: Normal range of motion and neck supple.  Skin:    Coloration: Skin is not pale.     Findings: No rash.  Neurological:     Mental Status: He is alert and oriented to person, place, and time.  Psychiatric:        Behavior: Behavior normal.     ED Results / Procedures / Treatments   Labs (all labs ordered are listed, but only abnormal results are displayed) Labs Reviewed  CBC WITH DIFFERENTIAL/PLATELET - Abnormal; Notable for the following components:      Result Value   RBC 3.67 (*)    Hemoglobin 9.2 (*)    HCT 27.5 (*)    MCV 74.9 (*)    MCH 25.1 (*)    RDW 19.6 (*)    Platelets  75 (*)    nRBC 2.7 (*)    All other components within normal limits  COMPREHENSIVE METABOLIC PANEL - Abnormal; Notable for the following components:   Glucose, Bld 100 (*)    Albumin 2.8 (*)    Total Bilirubin 1.6 (*)    All other components within normal limits  URINE CULTURE  LIPASE, BLOOD  URINALYSIS, ROUTINE W REFLEX MICROSCOPIC    EKG None  Radiology CT ABDOMEN PELVIS W CONTRAST  Result Date: 11/29/2021 CLINICAL DATA:  Abdominal pain and emesis EXAM: CT ABDOMEN AND PELVIS WITH CONTRAST TECHNIQUE: Multidetector CT imaging of the abdomen and pelvis was performed using the standard protocol following bolus administration of intravenous contrast. RADIATION DOSE REDUCTION: This exam was performed according to the departmental dose-optimization program which includes automated exposure control, adjustment of the mA and/or kV according to patient size and/or use of iterative reconstruction technique. CONTRAST:  37m OMNIPAQUE IOHEXOL 350 MG/ML SOLN COMPARISON:  CT abdomen  and pelvis dated July 13th 2021 FINDINGS: Lower chest: No acute abnormality. Hepatobiliary: No focal liver abnormality is seen. No gallstones, gallbladder wall thickening, or biliary dilatation. Pancreas: Unremarkable. No pancreatic ductal dilatation or surrounding inflammatory changes. Spleen: Normal in size without focal abnormality. Adrenals/Urinary Tract: Bilateral adrenal glands are unremarkable. No hydronephrosis or nephrolithiasis. Bilateral low-attenuation renal lesions largest are compatible with simple cysts, others are too small to completely characterize, no further follow-up imaging is recommended. Bladder is unremarkable. Stomach/Bowel: Stomach is within normal limits. Appendix appears normal. No evidence of bowel wall thickening, distention, or inflammatory changes. Vascular/Lymphatic: No significant vascular findings are present. No enlarged abdominal or pelvic lymph nodes. Reproductive: Prostate is surgically absent. Ovoid structures previously described along the pelvic sidewalls are no longer present, likely resolved hematomas. Other: No abdominal wall hernia or abnormality. No abdominopelvic ascites. Musculoskeletal: No acute or significant osseous findings. IMPRESSION: No acute findings in the abdomen or pelvis. Electronically Signed   By: LYetta GlassmanM.D.   On: 11/29/2021 08:52    Procedures Procedures    Medications Ordered in ED Medications  ondansetron (ZOFRAN-ODT) disintegrating tablet 4 mg (has no administration in time range)  iohexol (OMNIPAQUE) 350 MG/ML injection 75 mL (75 mLs Intravenous Contrast Given 11/29/21 0839)  alum & mag hydroxide-simeth (MAALOX/MYLANTA) 200-200-20 MG/5ML suspension 30 mL (30 mLs Oral Given 11/29/21 1028)    ED Course/ Medical Decision Making/ A&P                           Medical Decision Making Risk OTC drugs. Prescription drug management.   71yo M with a chief complaints of epigastric and right upper quadrant abdominal pain.  This  has been going on for about a week now.  Not exertional seems to be related to eating.  Does drink alcohol quite a bit according to the family.  LFTs and lipase are unremarkable.  He had a CT scan of his abdomen pelvis that was negative for acute intra-abdominal pathology.  No ascites, no obvious liver or gallbladder involvement.  Has a benign exam here.  He is tolerating by mouth here.  We will treat as possible alcoholic gastritis.  Have him follow-up with his family doctor in the office.  10:30 AM:  I have discussed the diagnosis/risks/treatment options with the patient and family.  Evaluation and diagnostic testing in the emergency department does not suggest an emergent condition requiring admission or immediate intervention beyond what has been performed at this time.  They  will follow up with PCP. We also discussed returning to the ED immediately if new or worsening sx occur. We discussed the sx which are most concerning (e.g., sudden worsening pain, fever, inability to tolerate by mouth) that necessitate immediate return. Medications administered to the patient during their visit and any new prescriptions provided to the patient are listed below.  Medications given during this visit Medications  ondansetron (ZOFRAN-ODT) disintegrating tablet 4 mg (has no administration in time range)  iohexol (OMNIPAQUE) 350 MG/ML injection 75 mL (75 mLs Intravenous Contrast Given 11/29/21 0839)  alum & mag hydroxide-simeth (MAALOX/MYLANTA) 200-200-20 MG/5ML suspension 30 mL (30 mLs Oral Given 11/29/21 1028)     The patient appears reasonably screen and/or stabilized for discharge and I doubt any other medical condition or other [facility] requiring further screening, evaluation, or treatment in the ED at this time prior to discharge.          Final Clinical Impression(s) / ED Diagnoses Final diagnoses:  Epigastric abdominal pain    Rx / DC Orders ED Discharge Orders          Ordered    ondansetron  (ZOFRAN-ODT) 4 MG disintegrating tablet        11/29/21 [place], Christeena Krogh, DO 11/29/21 1030

## 2021-11-29 NOTE — ED Notes (Signed)
Patient states he is still having abd. Pain has a history of pancreatitis

## 2021-11-30 LAB — URINE CULTURE: Culture: NO GROWTH

## 2021-12-10 ENCOUNTER — Encounter (HOSPITAL_COMMUNITY): Payer: Self-pay | Admitting: Emergency Medicine

## 2021-12-10 ENCOUNTER — Emergency Department (HOSPITAL_COMMUNITY): Payer: 59

## 2021-12-10 ENCOUNTER — Other Ambulatory Visit: Payer: Self-pay

## 2021-12-10 ENCOUNTER — Inpatient Hospital Stay (HOSPITAL_COMMUNITY)
Admission: EM | Admit: 2021-12-10 | Discharge: 2021-12-20 | DRG: 872 | Disposition: A | Discharge status: Home / Self Care | Payer: 59 | Attending: Internal Medicine | Admitting: Internal Medicine

## 2021-12-10 DIAGNOSIS — R079 Chest pain, unspecified: Secondary | ICD-10-CM | POA: Diagnosis not present

## 2021-12-10 DIAGNOSIS — R69 Illness, unspecified: Secondary | ICD-10-CM | POA: Diagnosis not present

## 2021-12-10 DIAGNOSIS — I10 Essential (primary) hypertension: Secondary | ICD-10-CM | POA: Diagnosis not present

## 2021-12-10 DIAGNOSIS — M5451 Vertebrogenic low back pain: Secondary | ICD-10-CM | POA: Diagnosis not present

## 2021-12-10 DIAGNOSIS — Z20822 Contact with and (suspected) exposure to covid-19: Secondary | ICD-10-CM | POA: Diagnosis not present

## 2021-12-10 DIAGNOSIS — M5431 Sciatica, right side: Secondary | ICD-10-CM | POA: Diagnosis not present

## 2021-12-10 DIAGNOSIS — R918 Other nonspecific abnormal finding of lung field: Secondary | ICD-10-CM | POA: Diagnosis not present

## 2021-12-10 DIAGNOSIS — N390 Urinary tract infection, site not specified: Secondary | ICD-10-CM

## 2021-12-10 DIAGNOSIS — R000 Tachycardia, unspecified: Secondary | ICD-10-CM | POA: Diagnosis not present

## 2021-12-10 DIAGNOSIS — Z9079 Acquired absence of other genital organ(s): Secondary | ICD-10-CM | POA: Diagnosis not present

## 2021-12-10 DIAGNOSIS — D619 Aplastic anemia, unspecified: Secondary | ICD-10-CM | POA: Diagnosis not present

## 2021-12-10 DIAGNOSIS — D61818 Other pancytopenia: Secondary | ICD-10-CM | POA: Diagnosis present

## 2021-12-10 DIAGNOSIS — E669 Obesity, unspecified: Secondary | ICD-10-CM | POA: Diagnosis not present

## 2021-12-10 DIAGNOSIS — M5441 Lumbago with sciatica, right side: Secondary | ICD-10-CM | POA: Diagnosis not present

## 2021-12-10 DIAGNOSIS — Z8546 Personal history of malignant neoplasm of prostate: Secondary | ICD-10-CM

## 2021-12-10 DIAGNOSIS — Z6830 Body mass index (BMI) 30.0-30.9, adult: Secondary | ICD-10-CM | POA: Diagnosis not present

## 2021-12-10 DIAGNOSIS — D469 Myelodysplastic syndrome, unspecified: Secondary | ICD-10-CM | POA: Diagnosis present

## 2021-12-10 DIAGNOSIS — C61 Malignant neoplasm of prostate: Secondary | ICD-10-CM | POA: Diagnosis present

## 2021-12-10 DIAGNOSIS — R1032 Left lower quadrant pain: Secondary | ICD-10-CM | POA: Diagnosis not present

## 2021-12-10 DIAGNOSIS — E871 Hypo-osmolality and hyponatremia: Secondary | ICD-10-CM | POA: Diagnosis not present

## 2021-12-10 DIAGNOSIS — Z86711 Personal history of pulmonary embolism: Secondary | ICD-10-CM | POA: Diagnosis not present

## 2021-12-10 DIAGNOSIS — M4807 Spinal stenosis, lumbosacral region: Secondary | ICD-10-CM | POA: Diagnosis not present

## 2021-12-10 DIAGNOSIS — Z888 Allergy status to other drugs, medicaments and biological substances status: Secondary | ICD-10-CM

## 2021-12-10 DIAGNOSIS — D649 Anemia, unspecified: Secondary | ICD-10-CM | POA: Insufficient documentation

## 2021-12-10 DIAGNOSIS — Z0389 Encounter for observation for other suspected diseases and conditions ruled out: Secondary | ICD-10-CM | POA: Diagnosis not present

## 2021-12-10 DIAGNOSIS — M5126 Other intervertebral disc displacement, lumbar region: Secondary | ICD-10-CM | POA: Diagnosis not present

## 2021-12-10 DIAGNOSIS — F1010 Alcohol abuse, uncomplicated: Secondary | ICD-10-CM | POA: Diagnosis present

## 2021-12-10 DIAGNOSIS — A419 Sepsis, unspecified organism: Secondary | ICD-10-CM | POA: Diagnosis not present

## 2021-12-10 DIAGNOSIS — D696 Thrombocytopenia, unspecified: Secondary | ICD-10-CM | POA: Diagnosis not present

## 2021-12-10 DIAGNOSIS — R6520 Severe sepsis without septic shock: Secondary | ICD-10-CM | POA: Diagnosis present

## 2021-12-10 DIAGNOSIS — E861 Hypovolemia: Secondary | ICD-10-CM | POA: Diagnosis present

## 2021-12-10 DIAGNOSIS — E882 Lipomatosis, not elsewhere classified: Secondary | ICD-10-CM | POA: Diagnosis present

## 2021-12-10 DIAGNOSIS — Z79899 Other long term (current) drug therapy: Secondary | ICD-10-CM

## 2021-12-10 DIAGNOSIS — R509 Fever, unspecified: Secondary | ICD-10-CM | POA: Diagnosis not present

## 2021-12-10 DIAGNOSIS — M48061 Spinal stenosis, lumbar region without neurogenic claudication: Secondary | ICD-10-CM | POA: Diagnosis not present

## 2021-12-10 DIAGNOSIS — D1779 Benign lipomatous neoplasm of other sites: Secondary | ICD-10-CM | POA: Diagnosis not present

## 2021-12-10 DIAGNOSIS — R531 Weakness: Secondary | ICD-10-CM

## 2021-12-10 DIAGNOSIS — R0602 Shortness of breath: Secondary | ICD-10-CM | POA: Diagnosis not present

## 2021-12-10 DIAGNOSIS — D7589 Other specified diseases of blood and blood-forming organs: Secondary | ICD-10-CM | POA: Diagnosis not present

## 2021-12-10 LAB — CBC
HCT: 20.2 % — ABNORMAL LOW (ref 39.0–52.0)
HCT: 20.6 % — ABNORMAL LOW (ref 39.0–52.0)
Hemoglobin: 6.2 g/dL — CL (ref 13.0–17.0)
Hemoglobin: 6.3 g/dL — CL (ref 13.0–17.0)
MCH: 27.7 pg (ref 26.0–34.0)
MCH: 28.3 pg (ref 26.0–34.0)
MCHC: 30.6 g/dL (ref 30.0–36.0)
MCHC: 30.7 g/dL (ref 30.0–36.0)
MCV: 90.2 fL (ref 80.0–100.0)
MCV: 92.4 fL (ref 80.0–100.0)
Platelets: 66 10*3/uL — ABNORMAL LOW (ref 150–400)
Platelets: 78 10*3/uL — ABNORMAL LOW (ref 150–400)
RBC: 2.23 MIL/uL — ABNORMAL LOW (ref 4.22–5.81)
RBC: 2.24 MIL/uL — ABNORMAL LOW (ref 4.22–5.81)
RDW: 22.5 % — ABNORMAL HIGH (ref 11.5–15.5)
RDW: 24.9 % — ABNORMAL HIGH (ref 11.5–15.5)
WBC: 3.1 10*3/uL — ABNORMAL LOW (ref 4.0–10.5)
WBC: 3.8 10*3/uL — ABNORMAL LOW (ref 4.0–10.5)
nRBC: 12.1 % — ABNORMAL HIGH (ref 0.0–0.2)
nRBC: 13.4 % — ABNORMAL HIGH (ref 0.0–0.2)

## 2021-12-10 LAB — DIFFERENTIAL
Abs Immature Granulocytes: 0.03 10*3/uL (ref 0.00–0.07)
Basophils Absolute: 0 10*3/uL (ref 0.0–0.1)
Basophils Relative: 0 %
Eosinophils Absolute: 0 10*3/uL (ref 0.0–0.5)
Eosinophils Relative: 0 %
Immature Granulocytes: 1 %
Lymphocytes Relative: 33 %
Lymphs Abs: 1 10*3/uL (ref 0.7–4.0)
Monocytes Absolute: 0.4 10*3/uL (ref 0.1–1.0)
Monocytes Relative: 12 %
Neutro Abs: 1.7 10*3/uL (ref 1.7–7.7)
Neutrophils Relative %: 54 %

## 2021-12-10 LAB — COMPREHENSIVE METABOLIC PANEL
ALT: 31 U/L (ref 0–44)
AST: 34 U/L (ref 15–41)
Albumin: 2.5 g/dL — ABNORMAL LOW (ref 3.5–5.0)
Alkaline Phosphatase: 97 U/L (ref 38–126)
Anion gap: 13 (ref 5–15)
BUN: 13 mg/dL (ref 8–23)
CO2: 20 mmol/L — ABNORMAL LOW (ref 22–32)
Calcium: 8.4 mg/dL — ABNORMAL LOW (ref 8.9–10.3)
Chloride: 100 mmol/L (ref 98–111)
Creatinine, Ser: 1.14 mg/dL (ref 0.61–1.24)
GFR, Estimated: >60 mL/min (ref >60)
Glucose, Bld: 148 mg/dL — ABNORMAL HIGH (ref 70–99)
Potassium: 3.9 mmol/L (ref 3.5–5.1)
Sodium: 133 mmol/L — ABNORMAL LOW (ref 135–145)
Total Bilirubin: 1.9 mg/dL — ABNORMAL HIGH (ref 0.3–1.2)
Total Protein: 6.2 g/dL — ABNORMAL LOW (ref 6.5–8.1)

## 2021-12-10 LAB — CK: Total CK: 25 U/L — ABNORMAL LOW (ref 49–397)

## 2021-12-10 LAB — URINALYSIS, MICROSCOPIC (REFLEX)

## 2021-12-10 LAB — URINALYSIS, ROUTINE W REFLEX MICROSCOPIC
Glucose, UA: NEGATIVE mg/dL
Hgb urine dipstick: NEGATIVE
Ketones, ur: NEGATIVE mg/dL
Leukocytes,Ua: NEGATIVE
Nitrite: POSITIVE — AB
Protein, ur: 30 mg/dL — AB
Specific Gravity, Urine: >1.03 — ABNORMAL HIGH (ref 1.005–1.030)
pH: 5.5 (ref 5.0–8.0)

## 2021-12-10 LAB — RESP PANEL BY RT-PCR (FLU A&B, COVID) ARPGX2
Influenza A by PCR: NEGATIVE
Influenza B by PCR: NEGATIVE
SARS Coronavirus 2 by RT PCR: NEGATIVE

## 2021-12-10 LAB — TROPONIN I (HIGH SENSITIVITY)
Troponin I (High Sensitivity): 10 ng/L (ref <18)
Troponin I (High Sensitivity): 10 ng/L (ref <18)

## 2021-12-10 LAB — LACTIC ACID, PLASMA
Lactic Acid, Venous: 5 mmol/L (ref 0.5–1.9)
Lactic Acid, Venous: 3.7 mmol/L (ref 0.5–1.9)

## 2021-12-10 LAB — PREPARE RBC (CROSSMATCH)

## 2021-12-10 LAB — POC OCCULT BLOOD, ED: Fecal Occult Bld: NEGATIVE

## 2021-12-10 LAB — LIPASE, BLOOD: Lipase: 36 U/L (ref 11–51)

## 2021-12-10 MED ORDER — ACETAMINOPHEN 325 MG PO TABS
650.0000 mg | ORAL_TABLET | Freq: Once | ORAL | Status: AC
  Administered 2021-12-10: 650 mg via ORAL
  Filled 2021-12-10: qty 2

## 2021-12-10 MED ORDER — SODIUM CHLORIDE 0.9 % IV SOLN
2.0000 g | Freq: Three times a day (TID) | INTRAVENOUS | Status: AC
  Administered 2021-12-10 – 2021-12-16 (×20): 2 g via INTRAVENOUS
  Filled 2021-12-10 (×20): qty 12.5

## 2021-12-10 MED ORDER — IOHEXOL 350 MG/ML SOLN
100.0000 mL | Freq: Once | INTRAVENOUS | Status: AC | PRN
  Administered 2021-12-10: 75 mL via INTRAVENOUS

## 2021-12-10 MED ORDER — LACTATED RINGERS IV BOLUS
1000.0000 mL | Freq: Once | INTRAVENOUS | Status: AC
  Administered 2021-12-10: 1000 mL via INTRAVENOUS

## 2021-12-10 MED ORDER — VANCOMYCIN HCL 1750 MG/350ML IV SOLN
1750.0000 mg | Freq: Once | INTRAVENOUS | Status: AC
  Administered 2021-12-10: 1750 mg via INTRAVENOUS
  Filled 2021-12-10: qty 350

## 2021-12-10 MED ORDER — ONDANSETRON HCL 4 MG PO TABS: 4.0000 mg | ORAL_TABLET | Freq: Four times a day (QID) | ORAL | Status: DC | PRN

## 2021-12-10 MED ORDER — LORAZEPAM 2 MG/ML IJ SOLN: 1.0000 mg | INTRAMUSCULAR | Status: AC | PRN

## 2021-12-10 MED ORDER — SODIUM CHLORIDE 0.9 % IV SOLN
10.0000 mL/h | Freq: Once | INTRAVENOUS | Status: AC
  Administered 2021-12-10: 10 mL/h via INTRAVENOUS

## 2021-12-10 MED ORDER — SODIUM CHLORIDE 0.9 % IV SOLN: INTRAVENOUS | Status: AC

## 2021-12-10 MED ORDER — ACETAMINOPHEN 325 MG PO TABS
650.0000 mg | ORAL_TABLET | Freq: Four times a day (QID) | ORAL | Status: DC | PRN
  Administered 2021-12-11 – 2021-12-19 (×10): 650 mg via ORAL
  Filled 2021-12-10 (×11): qty 2

## 2021-12-10 MED ORDER — ACETAMINOPHEN 650 MG RE SUPP: 650.0000 mg | Freq: Four times a day (QID) | RECTAL | Status: DC | PRN

## 2021-12-10 MED ORDER — VANCOMYCIN HCL IN DEXTROSE 1-5 GM/200ML-% IV SOLN
1000.0000 mg | INTRAVENOUS | Status: DC
  Administered 2021-12-11: 1000 mg via INTRAVENOUS
  Filled 2021-12-10 (×2): qty 200

## 2021-12-10 MED ORDER — THIAMINE MONONITRATE 100 MG PO TABS
100.0000 mg | ORAL_TABLET | Freq: Every day | ORAL | Status: DC
  Administered 2021-12-10 – 2021-12-20 (×11): 100 mg via ORAL
  Filled 2021-12-10 (×11): qty 1

## 2021-12-10 MED ORDER — LORAZEPAM 1 MG PO TABS
1.0000 mg | ORAL_TABLET | ORAL | Status: AC | PRN
  Administered 2021-12-11: 1 mg via ORAL
  Filled 2021-12-10: qty 1

## 2021-12-10 MED ORDER — FOLIC ACID 1 MG PO TABS
1.0000 mg | ORAL_TABLET | Freq: Every day | ORAL | Status: DC
  Administered 2021-12-10 – 2021-12-20 (×11): 1 mg via ORAL
  Filled 2021-12-10 (×11): qty 1

## 2021-12-10 MED ORDER — ONDANSETRON HCL 4 MG/2ML IJ SOLN: 4.0000 mg | Freq: Four times a day (QID) | INTRAMUSCULAR | Status: DC | PRN

## 2021-12-10 MED ORDER — PANTOPRAZOLE SODIUM 40 MG IV SOLR
40.0000 mg | Freq: Two times a day (BID) | INTRAVENOUS | Status: DC
  Administered 2021-12-10 – 2021-12-18 (×17): 40 mg via INTRAVENOUS
  Filled 2021-12-10 (×18): qty 10

## 2021-12-10 MED ORDER — THIAMINE HCL 100 MG/ML IJ SOLN: 100.0000 mg | Freq: Every day | INTRAMUSCULAR | Status: DC

## 2021-12-10 MED ORDER — ADULT MULTIVITAMIN W/MINERALS CH
1.0000 | ORAL_TABLET | Freq: Every day | ORAL | Status: DC
  Administered 2021-12-10 – 2021-12-20 (×11): 1 via ORAL
  Filled 2021-12-10 (×11): qty 1

## 2021-12-10 NOTE — ED Provider Notes (Signed)
Tyler [name] Provider Note   CSN: [id number] Arrival date & time: 12/10/21  1107     History  Chief Complaint  Patient presents with   Chest Pain    Tyler [name] is a 71 y.o. male with medical history of prostate cancer, hypertension.  Patient presents to the ED for evaluation of chest pain, painless hematuria.  Patient accompanied by daughter who provides most of the history.  Patient daughter states that the last 3 weeks patient has been consistently ill having profuse night sweats, nausea, vomiting, diarrhea and decreased appetite.  The patient is also been complaining of abdominal tenderness in his lower abdominal field.  Patient was seen in the ED on 10/5 for chief complaint of abdominal pain.  At that time patient had unremarkable work-up to include CT abdomen pelvis and lab work which was unremarkable.  The patient was discharged home at this time.  Patient daughter states since this time the patient had progressively worsening symptoms.  The patient daughter states that the patient has had painless hematuria for the last 2 weeks which prompted him to stop taking his blood thinner.  Patient daughter states that the patient does report drinking moderate amounts of alcohol which he has done so for most of his life.  The patient daughter states over the last 2 to 3 weeks the patient has been taking 600 mg of ibuprofen intermittently throughout the day due to back pain.  Patient denies any blood in stool.  Chest Pain Associated symptoms: abdominal pain, dizziness, fever, nausea, shortness of breath, vomiting and weakness        Home Medications Prior to Admission medications   Medication Sig Start Date End Date Taking? Authorizing Provider  baclofen (LIORESAL) 10 MG tablet Take 10 mg by mouth 3 (three) times daily. 11/22/21  Yes [provider]  ibuprofen (ADVIL) 200 MG tablet Take 800 mg by mouth every 6 (six) hours as needed for mild  pain.   Yes [provider]  lisinopril (ZESTRIL) 5 MG tablet Take 5 mg by mouth daily.   Yes [provider]  Menthol-Methyl Salicylate (MUSCLE RUB) 10-15 % CREA Apply 1 Application topically daily as needed for muscle pain.   Yes [provider]  ondansetron (ZOFRAN-ODT) 4 MG disintegrating tablet '4mg'$  ODT q4 hours prn nausea/vomit 11/29/21  Yes Deno Etienne, DO      Allergies    Amlodipine    Review of Systems   Review of Systems  Constitutional:  Positive for appetite change, chills and fever.  Respiratory:  Positive for shortness of breath.   Cardiovascular:  Positive for chest pain.  Gastrointestinal:  Positive for abdominal pain, diarrhea, nausea and vomiting. Negative for blood in stool.  Genitourinary:  Positive for dysuria. Negative for flank pain, penile discharge, penile pain and scrotal swelling.  Neurological:  Positive for dizziness, weakness and light-headedness.  All other systems reviewed and are negative.   Physical Exam Updated Vital Signs BP 133/83   Pulse 95   Temp 99.9 F (37.7 C) (Oral)   Resp (!) 29   Ht '5\' 7"'$  (1.702 m)   Wt 88.5 kg   SpO2 100%   BMI 30.56 kg/m  Physical Exam Vitals and nursing note reviewed.  Constitutional:      General: He is not in acute distress.    Appearance: He is well-developed.  HENT:     Head: Normocephalic and atraumatic.     Mouth/Throat:     Mouth: Mucous  membranes are dry.     Pharynx: Oropharynx is clear.  Eyes:     Conjunctiva/sclera: Conjunctivae normal.  Cardiovascular:     Rate and Rhythm: Normal rate and regular rhythm.     Heart sounds: No murmur heard. Pulmonary:     Effort: Pulmonary effort is normal. No respiratory distress.     Breath sounds: Normal breath sounds.  Abdominal:     General: Abdomen is flat. Bowel sounds are normal.     Palpations: Abdomen is soft.     Tenderness: There is abdominal tenderness in the right lower quadrant, suprapubic area and left lower  quadrant. There is no right CVA tenderness or left CVA tenderness.  Musculoskeletal:        General: No swelling.     Cervical back: Neck supple.  Skin:    General: Skin is warm and dry.     Capillary Refill: Capillary refill takes less than 2 seconds.  Neurological:     General: No focal deficit present.     Mental Status: He is alert and oriented to person, place, and time.     GCS: GCS eye subscore is 4. GCS verbal subscore is 5. GCS motor subscore is 6.     Cranial Nerves: Cranial nerves 2-12 are intact. No cranial nerve deficit.  Psychiatric:        Mood and Affect: Mood normal.     ED Results / Procedures / Treatments   Labs (all labs ordered are listed, but only abnormal results are displayed) Labs Reviewed  COMPREHENSIVE METABOLIC PANEL - Abnormal; Notable for the following components:      Result Value   Sodium 133 (*)    CO2 20 (*)    Glucose, Bld 148 (*)    Calcium 8.4 (*)    Total Protein 6.2 (*)    Albumin 2.5 (*)    Total Bilirubin 1.9 (*)    All other components within normal limits  CBC - Abnormal; Notable for the following components:   WBC 3.8 (*)    RBC 2.24 (*)    Hemoglobin 6.2 (*)    HCT 20.2 (*)    RDW 24.9 (*)    Platelets 78 (*)    nRBC 13.4 (*)    All other components within normal limits  URINALYSIS, ROUTINE W REFLEX MICROSCOPIC - Abnormal; Notable for the following components:   Color, Urine AMBER (*)    APPearance HAZY (*)    Specific Gravity, Urine >1.030 (*)    Bilirubin Urine MODERATE (*)    Protein, ur 30 (*)    Nitrite POSITIVE (*)    All other components within normal limits  LACTIC ACID, PLASMA - Abnormal; Notable for the following components:   Lactic Acid, Venous 5.0 (*)    All other components within normal limits  LACTIC ACID, PLASMA - Abnormal; Notable for the following components:   Lactic Acid, Venous 3.7 (*)    All other components within normal limits  URINALYSIS, MICROSCOPIC (REFLEX) - Abnormal; Notable for the  following components:   Bacteria, UA FEW (*)    All other components within normal limits  CK - Abnormal; Notable for the following components:   Total CK 25 (*)    All other components within normal limits  RESP PANEL BY RT-PCR (FLU A&B, COVID) ARPGX2  URINE CULTURE  CULTURE, BLOOD (ROUTINE X 2)  CULTURE, BLOOD (ROUTINE X 2)  LIPASE, BLOOD  PATHOLOGIST SMEAR REVIEW  OCCULT BLOOD X 1 CARD TO LAB,  STOOL  HIV ANTIBODY (ROUTINE TESTING W REFLEX)  MAGNESIUM  MAGNESIUM  COMPREHENSIVE METABOLIC PANEL  CBC  PROTIME-INR  LACTATE DEHYDROGENASE  TECHNOLOGIST SMEAR REVIEW  DIFFERENTIAL  LACTIC ACID, PLASMA  POC OCCULT BLOOD, ED  TYPE AND SCREEN  PREPARE RBC (CROSSMATCH)  TROPONIN I (HIGH SENSITIVITY)  TROPONIN I (HIGH SENSITIVITY)    EKG EKG Interpretation  Date/Time:  Monday December 10 2021 12:13:53 EDT Ventricular Rate:  121 PR Interval:  136 QRS Duration: 74 QT Interval:  318 QTC Calculation: 451 R Axis:   36 Text Interpretation: Sinus tachycardia Otherwise normal ECG No significant change since last tracing Confirmed by Leanord Asal ([id number]) on 12/10/2021 2:14:33 PM  Radiology CT ABDOMEN PELVIS W CONTRAST  Result Date: 12/10/2021 CLINICAL DATA:  LEFT lower quadrant abdominal pain.  Sepsis EXAM: CT ABDOMEN AND PELVIS WITH CONTRAST TECHNIQUE: Multidetector CT imaging of the abdomen and pelvis was performed using the standard protocol following bolus administration of intravenous contrast. RADIATION DOSE REDUCTION: This exam was performed according to the departmental dose-optimization program which includes automated exposure control, adjustment of the mA and/or kV according to patient size and/or use of iterative reconstruction technique. CONTRAST:  37m OMNIPAQUE IOHEXOL 350 MG/ML SOLN COMPARISON:  None Available. FINDINGS: Lower chest: Lung bases are clear. Hepatobiliary: No focal hepatic lesion. No biliary duct dilatation. Common bile duct is normal. Pancreas: Pancreas is  normal. No ductal dilatation. No pancreatic inflammation. Spleen: Normal spleen Adrenals/urinary tract: Adrenal glands normal. Bilateral simple fluid attenuation renal cysts. No follow-up recommended for Bosniak 1 renal cysts. Ureters and bladder normal. Stomach/Bowel: Stomach, small bowel, appendix, and cecum are normal. The colon and rectosigmoid colon are normal. Vascular/Lymphatic: Abdominal aorta is normal caliber. No periportal or retroperitoneal adenopathy. No pelvic adenopathy. Reproductive: Post prostatectomy Other: Prior LEFT inguinal hernia repair.  No evidence recurrence. Musculoskeletal: No aggressive osseous lesion. IMPRESSION: 1. No acute findings abdomen pelvis. 2. Prior LEFT inguinal hernia repair without complication. 3. Bilateral benign renal cysts.  No obstructive uropathy Electronically Signed   By: SSuzy BouchardM.D.   On: 12/10/2021 16:15   DG Chest 2 View  Result Date: 12/10/2021 CLINICAL DATA:  Chest pain. EXAM: CHEST - 2 VIEW COMPARISON:  Chest x-ray 09/07/2019. FINDINGS: The heart size and mediastinal contours are within normal limits. Both lungs are clear. No visible pleural effusions or pneumothorax. No acute osseous abnormality. IMPRESSION: No active cardiopulmonary disease. Electronically Signed   By: FMargaretha SheffieldM.D.   On: 12/10/2021 12:44    Procedures Procedures   Medications Ordered in ED Medications  ceFEPIme (MAXIPIME) 2 g in sodium chloride 0.9 % 100 mL IVPB (0 g Intravenous Stopped 12/10/21 1529)  vancomycin (VANCOREADY) IVPB 1750 mg/350 mL (0 mg Intravenous Stopped 12/10/21 1709)    Followed by  vancomycin (VANCOCIN) IVPB 1000 mg/200 mL premix (has no administration in time range)  LORazepam (ATIVAN) tablet 1-4 mg (has no administration in time range)    Or  LORazepam (ATIVAN) injection 1-4 mg (has no administration in time range)  thiamine (VITAMIN B1) tablet 100 mg (has no administration in time range)    Or  thiamine (VITAMIN B1) injection 100  mg (has no administration in time range)  folic acid (FOLVITE) tablet 1 mg (has no administration in time range)  multivitamin with minerals tablet 1 tablet (has no administration in time range)  pantoprazole (PROTONIX) injection 40 mg (has no administration in time range)  0.9 %  sodium chloride infusion (has no administration in time range)  acetaminophen (TYLENOL)  tablet 650 mg (has no administration in time range)    Or  acetaminophen (TYLENOL) suppository 650 mg (has no administration in time range)  ondansetron (ZOFRAN) tablet 4 mg (has no administration in time range)    Or  ondansetron (ZOFRAN) injection 4 mg (has no administration in time range)  lactated ringers bolus 1,000 mL (0 mLs Intravenous Stopped 12/10/21 1619)  0.9 %  sodium chloride infusion (10 mL/hr Intravenous New Bag/Given 12/10/21 1617)  acetaminophen (TYLENOL) tablet 650 mg (650 mg Oral Given 12/10/21 1615)  iohexol (OMNIPAQUE) 350 MG/ML injection 100 mL (75 mLs Intravenous Contrast Given 12/10/21 1603)    ED Course/ Medical Decision Making/ A&P                           Medical Decision Making Amount and/or Complexity of Data Reviewed Labs: ordered. Radiology: ordered.   71 year old male presents to the ED for evaluation.  Please see HPI for further details.  On my examination the patient is with a temperature of 99.9 orally, tachycardic to 123.  The patient lung sounds are clear bilaterally, he is not hypoxic.  Patient abdomen is soft and compressible however there is tenderness on the left lower quadrant that extends across all lower abdominal fields.  Patient neurological examination shows no focal neurodeficits.  Due to patient elevated temperature, tachycardia there is concern for sepsis.  The patient work-up will include CBC, CMP, lipase, viral panel, urinalysis, lactate, blood cultures, troponin, CK, type and screen, CT abdomen pelvis, EKG, chest x-ray.  CMP with decreased sodium to 133, elevated  bilirubin 1.9.  The patient bilirubin was 1.6 12 days ago.  Patient CBC shows decreased hemoglobin of 6.2, the patient states he is dizzy and weak so this is concerning for symptomatic anemia.  The patient will be started on 1 unit of blood.  Patient lipase unremarkable.  The patient urinalysis shows moderate bilirubin, positive nitrite urine.  This along with patient tachycardia and appearance of sepsis is concerning for urosepsis.  The patient has been started on vancomycin and cefepime for pharmacy consult at this time.  Patient lactic acid elevated at 5 initially, second lactic acid 3.7.  Patient daughter states the patient is had blood in his urine for the last 2 weeks however there is no appearance of blood on the patient urinalysis.  CK was ordered at this time to rule out rhabdomyolysis, the patient CK is 25.  Patient [place] panel negative for all.  The patient CT abdomen pelvis with contrast shows no intra-abdominal pathology to account for the patient's symptoms.  The patient chest x-ray shows no consolidations or effusions.  Patient fecal occult blood testing was negative.  Patient given Tylenol for fever and pain, 1 L LR, 1 unit blood, vancomycin, cefepime.  Due to patient presentation and concern for urosepsis, the patient will be admitted.  I have consulted with [facility] hospitalist at this time and they have agreed to admit the patient for further management.  The patient stable at the time of admission.  Final Clinical Impression(s) / ED Diagnoses Final diagnoses:  Sepsis, due to unspecified organism, unspecified whether acute organ dysfunction present [facility])  Symptomatic anemia    Rx / DC Orders ED Discharge Orders     None         Azucena Cecil, PA-C 12/10/21 [place], [place], DO 12/11/21 [phone]

## 2021-12-10 NOTE — H&P (Signed)
[facility] History and Physical  Vann Okerlund VCB:[id number] DOB: 27-Sep-1950 DOA: 12/10/2021   PCP: [name], FNP  Specialists: Dr. [name] with [facility] for history of prostate cancer  Chief Complaint: Fever and chills  HPI: Tyler [name] is a 71 y.o. male with a past medical history of prostate cancer status post prostatectomy in 2021, history of bilateral pulmonary embolism after his surgery in 2021, essential hypertension who was in his usual state of health till a few weeks ago when he started developing back pain radiating down his leg.  He took Motrin.  There was no significant relief so he went to urgent care center.  Had x-rays done.  He was diagnosed with sciatica.  He was given prescription for baclofen.  The back pain has improved but he subsequently developed abdominal pain.  He presented to the emergency department on October 4.  Underwent CT scan during that visit which did not show any acute findings.  He was discharged home.  He subsequently started developing fevers.  He saw some blood in the urine.  He saw his urologist about 3 months ago when he was taken off of Eliquis at that time.  Patient has noticed some abdominal distention.  However he denies any abdominal pain currently.  He has had episodes of nausea and vomiting but denies any blood in the emesis.  No black stool.  No blood in the stool.  His daughter did mention that patient has been consuming significant amount of alcohol over the past 2 to 3 months.  In the emergency department patient was noted to be febrile.  He underwent blood work which showed anemia with a hemoglobin of 6.2 which was a drop from 9.2 on October 4.  His baseline hemoglobin seems to be around 8-9.  UA was abnormal.  Patient thought to have sepsis secondary to UTI.  Rectal exam was done which showed brown stool which was heme-negative.  He will be hospitalized for further management.  Home Medications: Prior to Admission  medications   Medication Sig Start Date End Date Taking? Authorizing Provider  baclofen (LIORESAL) 10 MG tablet Take 10 mg by mouth 3 (three) times daily. 11/22/21  Yes [provider]  ibuprofen (ADVIL) 200 MG tablet Take 800 mg by mouth every 6 (six) hours as needed for mild pain.   Yes [provider]  lisinopril (ZESTRIL) 5 MG tablet Take 5 mg by mouth daily.   Yes [provider]  Menthol-Methyl Salicylate (MUSCLE RUB) 10-15 % CREA Apply 1 Application topically daily as needed for muscle pain.   Yes [provider]  ondansetron (ZOFRAN-ODT) 4 MG disintegrating tablet 82m ODT q4 hours prn nausea/vomit 11/29/21  Yes FDeno Etienne DO    Allergies:  Allergies  Allergen Reactions   Amlodipine Swelling    BILATERAL FEET TO ANKLES    Past Medical History: Past Medical History:  Diagnosis Date   Cancer ([facility]    prostate   Hypertension     Past Surgical History:  Procedure Laterality Date   LYMPHADENECTOMY Bilateral 09/01/2019   Procedure: LYMPHADENECTOMY;  Surgeon: MAlexis Frock MD;  Location: [facility];  Service: Urology;  Laterality: Bilateral;   ROBOT ASSISTED LAPAROSCOPIC RADICAL PROSTATECTOMY N/A 09/01/2019   Procedure: XI ROBOTIC ASSISTED LAPAROSCOPIC RADICAL PROSTATECTOMY;  Surgeon: MAlexis Frock MD;  Location: [facility];  Service: Urology;  Laterality: N/A;  3 HRS    Social History: According to patient he drinks one 40 ounce bottle of beer on a daily  basis.  His daughter is of the opinion that he sometimes drinks up to 2-3 bottles on a daily basis.  No smoking.  No illicit drug use.  Usually independent with daily activities.  Family History:  Family History  Problem Relation Age of Onset   Stroke Father      Review of Systems - History obtained from the patient and daughter General ROS: positive for  - chills, fatigue, and fever Psychological ROS: negative Ophthalmic ROS: negative ENT ROS: negative Allergy and Immunology ROS:  negative Hematological and Lymphatic ROS: negative Endocrine ROS: negative Respiratory ROS: no cough, shortness of breath, or wheezing Cardiovascular ROS: Did mention 1 episode of chest pain recently Gastrointestinal ROS: As in HPI Genito-Urinary ROS: As in HPI Musculoskeletal ROS: positive for - back pain which has improved Neurological ROS: no TIA or stroke symptoms Dermatological ROS: A hyperpigmented area in the upper back has been noted by patient's family  Physical Examination  Vitals:   12/10/21 1615 12/10/21 1627 12/10/21 1630 12/10/21 1645  BP: 133/85 133/83 131/81 133/83  Pulse: 98 76 95 95  Resp: (!) 30 (!) 28 (!) 26 (!) 29  Temp:  99.9 F (37.7 C)    TempSrc:  Oral    SpO2: 98% 99% 99% 100%  Weight:      Height:        BP 133/83   Pulse 95   Temp 99.9 F (37.7 C) (Oral)   Resp (!) 29   Ht _0  (1.702 m)   Wt 88.5 kg   SpO2 100%   BMI 30.56 kg/m   General appearance: alert, cooperative, appears stated age, and no distress Head: Normocephalic, without obvious abnormality, atraumatic Eyes: conjunctivae/corneas clear. PERRL, EOM's intact.  Throat: lips, mucosa, and tongue normal; teeth and gums normal Neck: no adenopathy, no carotid bruit, no JVD, supple, symmetrical, trachea midline, and thyroid not enlarged, symmetric, no tenderness/mass/nodules Resp: clear to auscultation bilaterally Cardio: regular rate and rhythm, S1, S2 normal, no murmur, click, rub or gallop GI: soft, non-tender; bowel sounds normal; no masses,  no organomegaly Extremities: extremities normal, atraumatic, no cyanosis or edema Pulses: 2+ and symmetric Skin: Skin color, texture, turgor normal. No rashes or lesions Lymph nodes: Cervical, supraclavicular, and axillary nodes normal. Neurologic: Cranial nerves II to XII intact.  Motor strength equal bilateral upper and lower extremities.  No obvious focal neurological deficits identified.   Labs on Admission: I have personally reviewed  following labs and imaging studies  CBC: Recent Labs  Lab 12/10/21 1219  WBC 3.8*  HGB 6.2*  HCT 20.2*  MCV 90.2  PLT 78*   Basic Metabolic Panel: Recent Labs  Lab 12/10/21 1219  NA 133*  K 3.9  CL 100  CO2 20*  GLUCOSE 148*  BUN 13  CREATININE 1.14  CALCIUM 8.4*   GFR: Estimated Creatinine Clearance: 65 mL/min (by C-G formula based on SCr of 1.14 mg/dL). Liver Function Tests: Recent Labs  Lab 12/10/21 1219  AST 34  ALT 31  ALKPHOS 97  BILITOT 1.9*  PROT 6.2*  ALBUMIN 2.5*   Recent Labs  Lab 12/10/21 1219  LIPASE 36    Cardiac Enzymes: Recent Labs  Lab 12/10/21 1416  CKTOTAL 25*     Radiological Exams on Admission: CT ABDOMEN PELVIS W CONTRAST  Result Date: 12/10/2021 CLINICAL DATA:  LEFT lower quadrant abdominal pain.  Sepsis EXAM: CT ABDOMEN AND PELVIS WITH CONTRAST TECHNIQUE: Multidetector CT imaging of the abdomen and pelvis was performed using the standard  protocol following bolus administration of intravenous contrast. RADIATION DOSE REDUCTION: This exam was performed according to the departmental dose-optimization program which includes automated exposure control, adjustment of the mA and/or kV according to patient size and/or use of iterative reconstruction technique. CONTRAST:  50m OMNIPAQUE IOHEXOL 350 MG/ML SOLN COMPARISON:  None Available. FINDINGS: Lower chest: Lung bases are clear. Hepatobiliary: No focal hepatic lesion. No biliary duct dilatation. Common bile duct is normal. Pancreas: Pancreas is normal. No ductal dilatation. No pancreatic inflammation. Spleen: Normal spleen Adrenals/urinary tract: Adrenal glands normal. Bilateral simple fluid attenuation renal cysts. No follow-up recommended for Bosniak 1 renal cysts. Ureters and bladder normal. Stomach/Bowel: Stomach, small bowel, appendix, and cecum are normal. The colon and rectosigmoid colon are normal. Vascular/Lymphatic: Abdominal aorta is normal caliber. No periportal or retroperitoneal  adenopathy. No pelvic adenopathy. Reproductive: Post prostatectomy Other: Prior LEFT inguinal hernia repair.  No evidence recurrence. Musculoskeletal: No aggressive osseous lesion. IMPRESSION: 1. No acute findings abdomen pelvis. 2. Prior LEFT inguinal hernia repair without complication. 3. Bilateral benign renal cysts.  No obstructive uropathy Electronically Signed   By: SSuzy BouchardM.D.   On: 12/10/2021 16:15   DG Chest 2 View  Result Date: 12/10/2021 CLINICAL DATA:  Chest pain. EXAM: CHEST - 2 VIEW COMPARISON:  Chest x-ray 09/07/2019. FINDINGS: The heart size and mediastinal contours are within normal limits. Both lungs are clear. No visible pleural effusions or pneumothorax. No acute osseous abnormality. IMPRESSION: No active cardiopulmonary disease. Electronically Signed   By: FMargaretha SheffieldM.D.   On: 12/10/2021 12:44    My interpretation of Electrocardiogram: Sinus tachycardia 121 bpm.  Normal axis.  Intervals appear to be normal.  No concerning Q waves.  Nonspecific T wave changes with T inversion in lead III. no concerning ST segment changes.   Problem List  Principal Problem:   UTI (urinary tract infection) Active Problems:   [place]   Hypertension   Sepsis ([facility]   Normocytic anemia   Thrombocytopenia ([facility])   Hyponatremia   Sepsis secondary to UTI ([facility]   Assessment: This is a 71year old male with a past medical history as stated earlier who presented with fever chills.  Found to have abnormal UA.  He was tachycardic at presentation with fever.  Appears to have sepsis from urinary tract infection.  Also noted to be significantly anemic without any clear site of bleeding that is identified.  Patient also noted to have thrombocytopenia which could be either due to the Motrin that he had been consuming or could also be due to alcohol suppression of the bone marrow.  Plan:  #1. Urinary tract infection with sepsis: Met sepsis criteria due to fever and  tachycardia.  Lactic acid level was also significantly elevated.  Patient was given fluid boluses with improvement in lactic acid levels.  We will recheck the level.  Follow-up on culture data.  Patient has been started on vancomycin and cefepime.  CT scan of the abdomen pelvis does not show any acute findings which is reassuring.  Patient did mention seeing blood in the urine but that might have been several weeks ago.  No hemoglobin/rbc noted in the UA at this time.  #2.  Symptomatic anemia: Hemoglobin noted to be 6.2.  Reason for this is not entirely clear.  Could have had some subclinical oozing from his upper GI tract because of significant Motrin use recently for back pain.  However his rectal exam was negative for melanotic stool or even occult blood.  He  has not had any hematemesis.  No other obvious source of bleeding identified.  He has been ordered 1 unit of PRBC.  We will recheck labs tomorrow.  Unfortunately anemia panel was not sent prior to transfusion.  Old labs were reviewed.  His baseline hemoglobin is around 9.  No clear evidence for active GI bleed at this time.  #3.  Abdominal pain: No clear etiology noted on CT scan.  Could be from his UTI.  He could also have gastritis.  We will place him on PPI.  Transaminases were normal.  Lipase level was normal.  Liver open noted to be mildly elevated.  #4.  Thrombocytopenia/leukopenia: Could be due to alcohol suppression of the bone marrow.  Check LDH.  Peripheral smear.  Recheck labs tomorrow.  We will check differential.  #5.  Hyponatremia: Give IV fluids.  Probably due to hypovolemia.  #6.  Low back pain: Improved with baclofen.  No neurological deficits noted at this time.  Continue to monitor clinically.  Involve PT once patient has improved.  #7.  History of bilateral pulm embolism in 2021: He was on Eliquis up until 2 to 3 months ago when it was discontinued by his urologist due to hematuria.  It was a provoked PE after his prostate  surgery in 2021.  #8. Alcohol abuse: Heavy consumption in the last 2 to 3 months per patient's daughter.  Place him on CIWA protocol.  Thiamine, multivitamins and folic acid.   DVT Prophylaxis: SCDs Code Status: Full code Family Communication: Discussed with patient and his daughter Disposition: Hopefully return home in improved Consults called: None yet Admission Status: Status is: Inpatient Remains inpatient appropriate because: Sepsis due to UTI    Severity of Illness: The appropriate patient status for this patient is INPATIENT. Inpatient status is judged to be reasonable and necessary in order to provide the required intensity of service to ensure the patient's safety. The patient's presenting symptoms, physical exam findings, and initial radiographic and laboratory data in the context of their chronic comorbidities is felt to place them at high risk for further clinical deterioration. Furthermore, it is not anticipated that the patient will be medically stable for discharge from the hospital within 2 midnights of admission.   * I certify that at the point of admission it is my clinical judgment that the patient will require inpatient hospital care spanning beyond 2 midnights from the point of admission due to high intensity of service, high risk for further deterioration and high frequency of surveillance required.*   Further management decisions will depend on results of further testing and patient's response to treatment.   Kimya Mccahill [employer]  [facility] operational officer on [email or web address]  12/10/2021, 5:18 PM

## 2021-12-10 NOTE — ED Notes (Signed)
Transported to CT 

## 2021-12-10 NOTE — ED Notes (Signed)
Ordered hospital bed for pt 

## 2021-12-10 NOTE — Progress Notes (Signed)
Pharmacy Antibiotic Note  Tyler [name] is a 71 y.o. male admitted on 12/10/2021 with sepsis.  Pharmacy has been consulted for cefepime and vancomycin dosing. Patient is febrile to 101.1, current WBC 3.8, and renal function is stable. Unclear source, patient is complaining of shortness of breath, chest pain, and abdominal pain.   Plan: Start cefepime 2g every 8 hours.  Give IV Vancomycin '1750mg'$  x 1 for loading dose, followed by IV Vancomycin 1000 every 24 hours for eAUC 435.    Height: '5\' 7"'$  (170.2 cm) Weight: 88.5 kg (195 lb 1.7 oz) IBW/kg (Calculated) : 66.1  Temp (24hrs), Avg:100.5 F (38.1 C), Min:99.9 F (37.7 C), Max:101.1 F (38.4 C)  Recent Labs  Lab 12/10/21 1219 12/10/21 1226  WBC 3.8*  --   CREATININE 1.14  --   LATICACIDVEN  --  5.0*    Estimated Creatinine Clearance: 65 mL/min (by C-G formula based on SCr of 1.14 mg/dL).    Allergies  Allergen Reactions   Amlodipine Swelling    BILATERAL FEET TO ANKLES    Thank you for allowing pharmacy to be a part of this patient's care.  Ventura Sellers 12/10/2021 2:33 PM

## 2021-12-10 NOTE — ED Provider Triage Note (Signed)
Emergency Medicine Provider Triage Evaluation Note  Tyler [name] , a 71 y.o. male  was evaluated in triage.  Pt complains of chest pain, shortness of breath.  Accompanied by daughter who provides most of the history.  She states that for the past 3 weeks the patient has been consistently ill having "profuse sweating at night," nausea, vomiting, diarrhea, decreased appetite.  She states that today he woke up with severe left-sided chest pain and has been complaining about feeling short of breath.  Denies cough.  Also having abdominal pain.  No objective fever at home.  States that he has history of PE and was on Eliquis until about 2 months ago..  Review of Systems  Positive: See above Negative:   Physical Exam  BP 128/63 (BP Location: Right Arm)   Pulse (!) 123   Temp 99.9 F (37.7 C) (Oral)   Resp 18   SpO2 98%  Gen:   Awake, no distress   Resp:  Tachypnea MSK:   Moves extremities without difficulty  Other:  Tachycardia, no murmurs, abdomen is protuberant, soft, tender to palpation. Medical Decision Making  Medically screening exam initiated at 12:16 PM.  Appropriate orders placed.  Tyler [name] was informed that the remainder of the evaluation will be completed by another provider, this initial triage assessment does not replace that evaluation, and the importance of remaining in the ED until their evaluation is complete.  Asked triage RN to increase ESI from 3 to 2.  Ill-appearing male.  Will need complex work-up   Mickie Hillier, PA-C 12/10/21 1217

## 2021-12-10 NOTE — ED Triage Notes (Signed)
Patient complains of generalized weakness, chest pain, shortness of breath, and abdominal pain that started three weeks ago. Patient family states he was seen at urgent care for same at urgent care approximately three weeks, was diagnosed with arthritis and sciatica and prescribed baclofen but that medication has not improved symptoms. Patient is alert and in no apparent distress at this time.

## 2021-12-11 ENCOUNTER — Inpatient Hospital Stay (HOSPITAL_COMMUNITY): Payer: 59

## 2021-12-11 DIAGNOSIS — N390 Urinary tract infection, site not specified: Secondary | ICD-10-CM | POA: Diagnosis not present

## 2021-12-11 DIAGNOSIS — D61818 Other pancytopenia: Secondary | ICD-10-CM | POA: Diagnosis not present

## 2021-12-11 DIAGNOSIS — M5441 Lumbago with sciatica, right side: Secondary | ICD-10-CM | POA: Diagnosis not present

## 2021-12-11 DIAGNOSIS — D649 Anemia, unspecified: Secondary | ICD-10-CM | POA: Diagnosis not present

## 2021-12-11 DIAGNOSIS — D696 Thrombocytopenia, unspecified: Secondary | ICD-10-CM | POA: Diagnosis not present

## 2021-12-11 DIAGNOSIS — A419 Sepsis, unspecified organism: Secondary | ICD-10-CM | POA: Diagnosis not present

## 2021-12-11 LAB — COMPREHENSIVE METABOLIC PANEL
ALT: 22 U/L (ref 0–44)
AST: 26 U/L (ref 15–41)
Albumin: 2.1 g/dL — ABNORMAL LOW (ref 3.5–5.0)
Alkaline Phosphatase: 87 U/L (ref 38–126)
Anion gap: 9 (ref 5–15)
BUN: 11 mg/dL (ref 8–23)
CO2: 21 mmol/L — ABNORMAL LOW (ref 22–32)
Calcium: 8 mg/dL — ABNORMAL LOW (ref 8.9–10.3)
Chloride: 106 mmol/L (ref 98–111)
Creatinine, Ser: 0.97 mg/dL (ref 0.61–1.24)
GFR, Estimated: >60 mL/min (ref >60)
Glucose, Bld: 103 mg/dL — ABNORMAL HIGH (ref 70–99)
Potassium: 4.4 mmol/L (ref 3.5–5.1)
Sodium: 136 mmol/L (ref 135–145)
Total Bilirubin: 1.6 mg/dL — ABNORMAL HIGH (ref 0.3–1.2)
Total Protein: 5.2 g/dL — ABNORMAL LOW (ref 6.5–8.1)

## 2021-12-11 LAB — CBC WITH DIFFERENTIAL/PLATELET
Abs Immature Granulocytes: 0.02 10*3/uL (ref 0.00–0.07)
Basophils Absolute: 0 10*3/uL (ref 0.0–0.1)
Basophils Relative: 1 %
Eosinophils Absolute: 0 10*3/uL (ref 0.0–0.5)
Eosinophils Relative: 0 %
HCT: 22.9 % — ABNORMAL LOW (ref 39.0–52.0)
Hemoglobin: 7.4 g/dL — ABNORMAL LOW (ref 13.0–17.0)
Immature Granulocytes: 1 %
Lymphocytes Relative: 24 %
Lymphs Abs: 0.7 10*3/uL (ref 0.7–4.0)
MCH: 28.2 pg (ref 26.0–34.0)
MCHC: 32.3 g/dL (ref 30.0–36.0)
MCV: 87.4 fL (ref 80.0–100.0)
Monocytes Absolute: 0.3 10*3/uL (ref 0.1–1.0)
Monocytes Relative: 9 %
Neutro Abs: 1.8 10*3/uL (ref 1.7–7.7)
Neutrophils Relative %: 65 %
Platelets: 63 10*3/uL — ABNORMAL LOW (ref 150–400)
RBC: 2.62 MIL/uL — ABNORMAL LOW (ref 4.22–5.81)
RDW: 21.1 % — ABNORMAL HIGH (ref 11.5–15.5)
Smear Review: DECREASED
WBC: 2.8 10*3/uL — ABNORMAL LOW (ref 4.0–10.5)
nRBC: 11 % — ABNORMAL HIGH (ref 0.0–0.2)

## 2021-12-11 LAB — DIC (DISSEMINATED INTRAVASCULAR COAGULATION)PANEL
D-Dimer, Quant: 5.52 ug/mL-FEU — ABNORMAL HIGH (ref 0.00–0.50)
Fibrinogen: 684 mg/dL — ABNORMAL HIGH (ref 210–475)
INR: 1.4 — ABNORMAL HIGH (ref 0.8–1.2)
Platelets: 66 10*3/uL — ABNORMAL LOW (ref 150–400)
Prothrombin Time: 17.1 seconds — ABNORMAL HIGH (ref 11.4–15.2)
Smear Review: NONE SEEN
aPTT: 30 seconds (ref 24–36)

## 2021-12-11 LAB — PROTIME-INR
INR: 1.4 — ABNORMAL HIGH (ref 0.8–1.2)
Prothrombin Time: 17.4 seconds — ABNORMAL HIGH (ref 11.4–15.2)

## 2021-12-11 LAB — RETIC PANEL
Immature Retic Fract: 29.4 % — ABNORMAL HIGH (ref 2.3–15.9)
RBC.: 2.99 MIL/uL — ABNORMAL LOW (ref 4.22–5.81)
Retic Count, Absolute: 193.8 10*3/uL — ABNORMAL HIGH (ref 19.0–186.0)
Retic Ct Pct: 6.5 % — ABNORMAL HIGH (ref 0.4–3.1)
Reticulocyte Hemoglobin: 27.6 pg — ABNORMAL LOW (ref >27.9)

## 2021-12-11 LAB — DIRECT ANTIGLOBULIN TEST (NOT AT ARMC)
DAT, IgG: NEGATIVE
DAT, complement: NEGATIVE

## 2021-12-11 LAB — VITAMIN B12: Vitamin B-12: 348 pg/mL (ref 180–914)

## 2021-12-11 LAB — PROCALCITONIN: Procalcitonin: 0.2 ng/mL

## 2021-12-11 LAB — MAGNESIUM
Magnesium: 1.7 mg/dL (ref 1.7–2.4)
Magnesium: 1.9 mg/dL (ref 1.7–2.4)

## 2021-12-11 LAB — TECHNOLOGIST SMEAR REVIEW: Plt Morphology: DECREASED

## 2021-12-11 LAB — BILIRUBIN, FRACTIONATED(TOT/DIR/INDIR)
Bilirubin, Direct: 0.5 mg/dL — ABNORMAL HIGH (ref 0.0–0.2)
Indirect Bilirubin: 1.4 mg/dL — ABNORMAL HIGH (ref 0.3–0.9)
Total Bilirubin: 1.9 mg/dL — ABNORMAL HIGH (ref 0.3–1.2)

## 2021-12-11 LAB — LACTATE DEHYDROGENASE
LDH: 703 U/L — ABNORMAL HIGH (ref 98–192)
LDH: 901 U/L — ABNORMAL HIGH (ref 98–192)

## 2021-12-11 LAB — IMMATURE PLATELET FRACTION: Immature Platelet Fraction: 5.1 % (ref 1.2–8.6)

## 2021-12-11 LAB — TSH: TSH: 1.963 u[IU]/mL (ref 0.350–4.500)

## 2021-12-11 LAB — HIV ANTIBODY (ROUTINE TESTING W REFLEX): HIV Screen 4th Generation wRfx: NONREACTIVE

## 2021-12-11 LAB — FOLATE: Folate: 21.4 ng/mL (ref >5.9)

## 2021-12-11 LAB — LACTIC ACID, PLASMA: Lactic Acid, Venous: 1.8 mmol/L (ref 0.5–1.9)

## 2021-12-11 LAB — PREPARE RBC (CROSSMATCH)

## 2021-12-11 MED ORDER — ACETAMINOPHEN 325 MG PO TABS
325.0000 mg | ORAL_TABLET | Freq: Once | ORAL | Status: AC
  Administered 2021-12-11: 325 mg via ORAL
  Filled 2021-12-11: qty 1

## 2021-12-11 MED ORDER — MAGNESIUM SULFATE 2 GM/50ML IV SOLN
2.0000 g | Freq: Once | INTRAVENOUS | Status: AC
  Administered 2021-12-11: 2 g via INTRAVENOUS
  Filled 2021-12-11: qty 50

## 2021-12-11 MED ORDER — SODIUM CHLORIDE 0.9% IV SOLUTION: Freq: Once | INTRAVENOUS | Status: AC

## 2021-12-11 MED ORDER — ORAL CARE MOUTH RINSE: 15.0000 mL | OROMUCOSAL | Status: DC | PRN

## 2021-12-11 NOTE — Progress Notes (Addendum)
[facility] PROGRESS NOTE   Inaki Vantine DTO:[id number] DOB: [birth year] DOA: 12/10/2021  PCP: [name], FNP  Brief History/Interval Summary:  71 y.o. male with a past medical history of prostate cancer status post prostatectomy in 2021, history of bilateral pulmonary embolism after his surgery in 2021, essential hypertension who was in his usual state of health till a few weeks ago when he started developing back pain radiating down his leg.  He took Motrin.  There was no significant relief so he went to urgent care center.  Had x-rays done.  He was diagnosed with sciatica.  He was given prescription for baclofen.  The back pain has improved but he subsequently developed abdominal pain.  He presented to the emergency department on October 4.  Underwent CT scan during that visit which did not show any acute findings.  He was discharged home.  He subsequently started developing fevers.  He saw some blood in the urine.  He saw his urologist about 3 months ago when he was taken off of Eliquis at that time.  In the emergency department he was noted to have a low hemoglobin at 6.3.  He was noted to be febrile with abnormal UA.  He was hospitalized for further management.   Consultants: None  Procedures: None yet    Subjective/Interval History: Patient mentioned that he had low back pain to his right side radiating down his leg.  Could not get comfortable but then it subsided after half an hour or so.  Denies any nausea vomiting or abdominal pain this morning.     Assessment/Plan:  Sepsis/concern for UTI UA was noted to be abnormal with positive nitrite but he barely had any WBCs in the urine.  Bacteria was noted.  Urine culture was ordered but do not see it listed as pending.  Will reorder urine cultures.  Will reorder blood cultures as well.  We will check procalcitonin levels. Noted to have fever again overnight which could have been due to blood transfusion but is hard to  say.  He is leukopenic. We will proceed with MRI of the lumbar spine because that is his only other symptom at this time.  CT of the abdomen pelvis done yesterday did not show any acute findings. Continue with vancomycin and cefepime for now.  Lactic acid level was elevated and has improved.  Pancytopenia with symptomatic anemia Hemoglobin was 6.2 yesterday.  Was transfused did not rise significantly.  Required another unit of PRBC.  Noted to be 7.4. His stool for occult blood was negative.  No other signs of bleeding.  UA did not show any hematuria.  Platelet counts are also noted to be low.  His folic acid level is 98.3.  B12 level is 348.  No anemia panel was sent prior to blood transfusion yesterday. LDH is noted to be 703.  He has consumed a significant amount of alcohol in the last 2 to 3 months which could be contributing.  Bilirubin noted to be elevated.  It was fractionated.  Mostly indirect. TSH is 1.9. WBC morphology was unremarkable on smear.  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 Continue to trend his counts.  HIV is pending. Discussed with Dr. Irene Limbo with hematology/oncology.  He will consult on the patient.  Abdominal pain Lipase was normal.  LFTs unremarkable for the most part.  Gastritis was a consideration though no active bleeding has been noted.  Continue with PPI for now.  Back pain Has been ongoing for several  weeks now.  See discussion under sepsis.  Proceed with MRI of the lumbar spine to rule out occult infection.  Hyponatremia Likely due to hypovolemia.  Improved this morning.  History of bilateral pulmonary embolism in 2021 He was on Eliquis up until 2 to 3 months ago when it was discontinued by his urologist.  It was a provoked PE after his prostatectomy in 2021.  Alcohol abuse Heavy consumption in the last 2 to 3 months per patient's daughter.  No evidence of withdrawal currently.  Continue CIWA protocol.  Thiamine multivitamins folic acid.  History of prostate  cancer status post prostatectomy in 2021 Followed by Dr. [name] with urology.  PSA was undetectable earlier this month.  Results available in care everywhere.  DVT Prophylaxis: SCDs only for now Code Status: Full code Family Communication: Will update daughter later today.  Discussed with patient and his wife Disposition Plan: Hopefully return home in improved  Status is: Inpatient Remains inpatient appropriate because: Sepsis      Medications: Scheduled:  folic acid  1 mg Oral Daily   multivitamin with minerals  1 tablet Oral Daily   pantoprazole (PROTONIX) IV  40 mg Intravenous Q12H   thiamine  100 mg Oral Daily   Or   thiamine  100 mg Intravenous Daily   Continuous:  sodium chloride 100 mL/hr at 12/11/21 0428   ceFEPime (MAXIPIME) IV Stopped (12/11/21 0610)   vancomycin     QIO:[id number] **OR** acetaminophen, LORazepam **OR** LORazepam, ondansetron **OR** ondansetron (ZOFRAN) IV  Antibiotics: Anti-infectives (From admission, onward)    Start     Dose/Rate Route Frequency Ordered Stop   12/11/21 1445  vancomycin (VANCOCIN) IVPB 1000 mg/200 mL premix       See Hyperspace for full Linked Orders Report.   1,000 mg 200 mL/hr over 60 Minutes Intravenous Every 24 hours 12/10/21 1437     12/10/21 1445  ceFEPIme (MAXIPIME) 2 g in sodium chloride 0.9 % 100 mL IVPB        2 g 200 mL/hr over 30 Minutes Intravenous Every 8 hours 12/10/21 1437     12/10/21 1445  vancomycin (VANCOREADY) IVPB 1750 mg/350 mL       See Hyperspace for full Linked Orders Report.   1,750 mg 175 mL/hr over 120 Minutes Intravenous  Once 12/10/21 1437 12/10/21 1709       Objective:  Vital Signs  Vitals:   12/11/21 0500 12/11/21 0536 12/11/21 0600 12/11/21 0700  BP: 116/72  115/72   Pulse: 82  80 79  Resp: (!) 21  (!) 21 (!) 23  Temp:  98.2 F (36.8 C)    TempSrc:  Oral    SpO2: 99%  96% 99%  Weight:      Height:        Intake/Output Summary (Last 24 hours) at 12/11/2021 0948 Last data  filed at 12/10/2021 2355 Gross per 24 hour  Intake --  Output 650 ml  Net -650 ml   Filed Weights   12/10/21 1424  Weight: 88.5 kg    General appearance: Awake alert.  In no distress Resp: Clear to auscultation bilaterally.  Normal effort Cardio: S1-S2 is normal regular.  No S3-S4.  No rubs murmurs or bruit GI: Abdomen is soft.  Nontender nondistended.  Bowel sounds are present normal.  No masses organomegaly Extremities: No edema.  Full range of motion of lower extremities. Neurologic: Alert and oriented x3.  No focal neurological deficits.    Lab Results:  Data Reviewed: I  have personally reviewed following labs and reports of the imaging studies  CBC: Recent Labs  Lab 12/10/21 1219 12/10/21 2331 12/11/21 0500  WBC 3.8* 3.1* 2.8*  NEUTROABS  --  1.7 1.8  HGB 6.2* 6.3* 7.4*  HCT 20.2* 20.6* 22.9*  MCV 90.2 92.4 87.4  PLT 78* 66* 63*    Basic Metabolic Panel: Recent Labs  Lab 12/10/21 1219 12/10/21 2331 12/11/21 0500  NA 133*  --  136  K 3.9  --  4.4  CL 100  --  106  CO2 20*  --  21*  GLUCOSE 148*  --  103*  BUN 13  --  11  CREATININE 1.14  --  0.97  CALCIUM 8.4*  --  8.0*  MG  --  1.9 1.7    GFR: Estimated Creatinine Clearance: 76.3 mL/min (by C-G formula based on SCr of 0.97 mg/dL).  Liver Function Tests: Recent Labs  Lab 12/10/21 1219 12/11/21 0500  AST 34 26  ALT 31 22  ALKPHOS 97 87  BILITOT 1.9* 1.6*  PROT 6.2* 5.2*  ALBUMIN 2.5* 2.1*    Recent Labs  Lab 12/10/21 1219  LIPASE 36   No results for input(s): "AMMONIA" in the last 168 hours.  Coagulation Profile: Recent Labs  Lab 12/11/21 0500  INR 1.4*    Cardiac Enzymes: Recent Labs  Lab 12/10/21 1416  CKTOTAL 25*    BNP (last 3 results) No results for input(s): "PROBNP" in the last 8760 hours.  HbA1C: No results for input(s): "HGBA1C" in the last 72 hours.  CBG: No results for input(s): "GLUCAP" in the last 168 hours.  Lipid Profile: No results for input(s):  "CHOL", "HDL", "LDLCALC", "TRIG", "CHOLHDL", "LDLDIRECT" in the last 72 hours.  Thyroid Function Tests: Recent Labs    12/11/21 0500  TSH 1.963    Anemia Panel: Recent Labs    12/11/21 0500  VITAMINB12 348  FOLATE 21.4    Recent Results (from the past 240 hour(s))  Resp Panel by RT-PCR (Flu A&B, Covid) Anterior Nasal Swab     Status: None   Collection Time: 12/10/21 12:16 PM   Specimen: Anterior Nasal Swab  Result Value Ref Range Status   SARS Coronavirus 2 by RT PCR NEGATIVE NEGATIVE Final    Comment: (NOTE) SARS-CoV-2 target nucleic acids are NOT DETECTED.  The SARS-CoV-2 RNA is generally detectable in upper respiratory specimens during the acute phase of infection. The lowest concentration of SARS-CoV-2 viral copies this assay can detect is 138 copies/mL. A negative result does not preclude SARS-Cov-2 infection and should not be used as the sole basis for treatment or other patient management decisions. A negative result may occur with  improper specimen collection/handling, submission of specimen other than nasopharyngeal swab, presence of viral mutation(s) within the areas targeted by this assay, and inadequate number of viral copies(<138 copies/mL). A negative result must be combined with clinical observations, patient history, and epidemiological information. The expected result is Negative.  Fact Sheet for Patients:  EntrepreneurPulse.com.au  Fact Sheet for Healthcare Providers:  IncredibleEmployment.be  This test is no t yet approved or cleared by the [place] FDA and  has been authorized for detection and/or diagnosis of SARS-CoV-2 by FDA under an Emergency Use Authorization (EUA). This EUA will remain  in effect (meaning this test can be used) for the duration of the COVID-19 declaration under Section 564(b)(1) of the Act, 21 U.S.C.section 360bbb-3(b)(1), unless the authorization is terminated  or revoked sooner.  Influenza A by PCR NEGATIVE NEGATIVE Final   Influenza B by PCR NEGATIVE NEGATIVE Final    Comment: (NOTE) The Xpert Xpress SARS-CoV-2/FLU/RSV plus assay is intended as an aid in the diagnosis of influenza from Nasopharyngeal swab specimens and should not be used as a sole basis for treatment. Nasal washings and aspirates are unacceptable for Xpert Xpress SARS-CoV-2/FLU/RSV testing.  Fact Sheet for Patients: EntrepreneurPulse.com.au  Fact Sheet for Healthcare Providers: IncredibleEmployment.be  This test is not yet approved or cleared by the [place] FDA and has been authorized for detection and/or diagnosis of SARS-CoV-2 by FDA under an Emergency Use Authorization (EUA). This EUA will remain in effect (meaning this test can be used) for the duration of the COVID-19 declaration under Section 564(b)(1) of the Act, 21 U.S.C. section 360bbb-3(b)(1), unless the authorization is terminated or revoked.  Performed at [facility], [place] [address], [place],  [address]       Radiology Studies: CT ABDOMEN PELVIS W CONTRAST  Result Date: 12/10/2021 CLINICAL DATA:  LEFT lower quadrant abdominal pain.  Sepsis EXAM: CT ABDOMEN AND PELVIS WITH CONTRAST TECHNIQUE: Multidetector CT imaging of the abdomen and pelvis was performed using the standard protocol following bolus administration of intravenous contrast. RADIATION DOSE REDUCTION: This exam was performed according to the departmental dose-optimization program which includes automated exposure control, adjustment of the mA and/or kV according to patient size and/or use of iterative reconstruction technique. CONTRAST:  25m OMNIPAQUE IOHEXOL 350 MG/ML SOLN COMPARISON:  None Available. FINDINGS: Lower chest: Lung bases are clear. Hepatobiliary: No focal hepatic lesion. No biliary duct dilatation. Common bile duct is normal. Pancreas: Pancreas is normal. No ductal dilatation. No  pancreatic inflammation. Spleen: Normal spleen Adrenals/urinary tract: Adrenal glands normal. Bilateral simple fluid attenuation renal cysts. No follow-up recommended for Bosniak 1 renal cysts. Ureters and bladder normal. Stomach/Bowel: Stomach, small bowel, appendix, and cecum are normal. The colon and rectosigmoid colon are normal. Vascular/Lymphatic: Abdominal aorta is normal caliber. No periportal or retroperitoneal adenopathy. No pelvic adenopathy. Reproductive: Post prostatectomy Other: Prior LEFT inguinal hernia repair.  No evidence recurrence. Musculoskeletal: No aggressive osseous lesion. IMPRESSION: 1. No acute findings abdomen pelvis. 2. Prior LEFT inguinal hernia repair without complication. 3. Bilateral benign renal cysts.  No obstructive uropathy Electronically Signed   By: SSuzy BouchardM.D.   On: 12/10/2021 16:15   DG Chest 2 View  Result Date: 12/10/2021 CLINICAL DATA:  Chest pain. EXAM: CHEST - 2 VIEW COMPARISON:  Chest x-ray 09/07/2019. FINDINGS: The heart size and mediastinal contours are within normal limits. Both lungs are clear. No visible pleural effusions or pneumothorax. No acute osseous abnormality. IMPRESSION: No active cardiopulmonary disease. Electronically Signed   By: FMargaretha SheffieldM.D.   On: 12/10/2021 12:44       LOS: 1 day   [facility] Pager on [email or web address]  12/11/2021, 9:48 AM

## 2021-12-11 NOTE — ED Notes (Signed)
Floor coverage Hospitalitis Dr Milderd Meager informed post transfusion VS T 101. Given PRN tylenol for temp. No additional s/s. Pt denies CP/SOB. No additional orders by MD.

## 2021-12-11 NOTE — ED Notes (Signed)
Discussed pt with admitting [facility] Cover[facility] Dr Milderd Meager.  Informed, Pt admitted for symptomatic anemia and urosepsis. Initial Hgb 6.2, transfused 1 unit PRBC, repeat hgb post 2 hrs transfusion Hgb 6.3. VS HR 82, 100% Room air, BP 127/90 (102), RR 21. Next CBC due at 5 am.   Recommended second unit of PRBC. Pt and wife at bedside updated. Verbalized understanding of plan. Second unit of PRBC started and currently infusing.

## 2021-12-11 NOTE — Progress Notes (Signed)
NEW ADMISSION NOTE New Admission Note:   Arrival Method: stretcher Mental Orientation: A&OX4 Telemetry:5M19 Assessment: Completed Skin:intact IV: L hand, RAC Pain: 10/10 Tubes: none  Safety Measures: Safety Fall Prevention Plan has been given, discussed and signed Admission: Completed [facility] Orientation: Patient has been orientated to the room, unit and staff.  Family: wife and son in law at bedside   Orders have been reviewed and implemented. Will continue to monitor the patient. Call light has been placed within reach and bed alarm has been activated.   Susannah Carbin S Dondrea Clendenin, RN

## 2021-12-11 NOTE — Plan of Care (Signed)
  Problem: Urinary Elimination: Goal: Signs and symptoms of infection will decrease Outcome: Progressing   

## 2021-12-12 DIAGNOSIS — D649 Anemia, unspecified: Secondary | ICD-10-CM

## 2021-12-12 DIAGNOSIS — A419 Sepsis, unspecified organism: Secondary | ICD-10-CM | POA: Diagnosis not present

## 2021-12-12 DIAGNOSIS — C61 Malignant neoplasm of prostate: Secondary | ICD-10-CM

## 2021-12-12 DIAGNOSIS — N390 Urinary tract infection, site not specified: Secondary | ICD-10-CM | POA: Diagnosis not present

## 2021-12-12 LAB — COMPREHENSIVE METABOLIC PANEL
ALT: 25 U/L (ref 0–44)
AST: 29 U/L (ref 15–41)
Albumin: 2.3 g/dL — ABNORMAL LOW (ref 3.5–5.0)
Alkaline Phosphatase: 97 U/L (ref 38–126)
Anion gap: 11 (ref 5–15)
BUN: 10 mg/dL (ref 8–23)
CO2: 21 mmol/L — ABNORMAL LOW (ref 22–32)
Calcium: 8.8 mg/dL — ABNORMAL LOW (ref 8.9–10.3)
Chloride: 105 mmol/L (ref 98–111)
Creatinine, Ser: 1.03 mg/dL (ref 0.61–1.24)
GFR, Estimated: >60 mL/min (ref >60)
Glucose, Bld: 99 mg/dL (ref 70–99)
Potassium: 4.3 mmol/L (ref 3.5–5.1)
Sodium: 137 mmol/L (ref 135–145)
Total Bilirubin: 1.9 mg/dL — ABNORMAL HIGH (ref 0.3–1.2)
Total Protein: 5.9 g/dL — ABNORMAL LOW (ref 6.5–8.1)

## 2021-12-12 LAB — BPAM RBC
Blood Product Expiration Date: 202311152359
Blood Product Expiration Date: 202311162359
ISSUE DATE / TIME: 202310161543
ISSUE DATE / TIME: 202310170015
Unit Type and Rh: 5100
Unit Type and Rh: 5100

## 2021-12-12 LAB — RETICULOCYTES
Immature Retic Fract: 28.7 % — ABNORMAL HIGH (ref 2.3–15.9)
RBC.: 3.01 MIL/uL — ABNORMAL LOW (ref 4.22–5.81)
Retic Count, Absolute: 202 10*3/uL — ABNORMAL HIGH (ref 19.0–186.0)
Retic Ct Pct: 6.7 % — ABNORMAL HIGH (ref 0.4–3.1)

## 2021-12-12 LAB — CBC WITH DIFFERENTIAL/PLATELET
Abs Immature Granulocytes: 0.02 10*3/uL (ref 0.00–0.07)
Basophils Absolute: 0 10*3/uL (ref 0.0–0.1)
Basophils Relative: 0 %
Eosinophils Absolute: 0 10*3/uL (ref 0.0–0.5)
Eosinophils Relative: 0 %
HCT: 26 % — ABNORMAL LOW (ref 39.0–52.0)
Hemoglobin: 8.4 g/dL — ABNORMAL LOW (ref 13.0–17.0)
Immature Granulocytes: 1 %
Lymphocytes Relative: 33 %
Lymphs Abs: 1 10*3/uL (ref 0.7–4.0)
MCH: 28.4 pg (ref 26.0–34.0)
MCHC: 32.3 g/dL (ref 30.0–36.0)
MCV: 87.8 fL (ref 80.0–100.0)
Monocytes Absolute: 0.3 10*3/uL (ref 0.1–1.0)
Monocytes Relative: 8 %
Neutro Abs: 1.8 10*3/uL (ref 1.7–7.7)
Neutrophils Relative %: 58 %
Platelets: 53 10*3/uL — ABNORMAL LOW (ref 150–400)
RBC: 2.96 MIL/uL — ABNORMAL LOW (ref 4.22–5.81)
RDW: 21.9 % — ABNORMAL HIGH (ref 11.5–15.5)
WBC: 3.1 10*3/uL — ABNORMAL LOW (ref 4.0–10.5)
nRBC: 7.5 % — ABNORMAL HIGH (ref 0.0–0.2)

## 2021-12-12 LAB — URINE CULTURE: Culture: NO GROWTH

## 2021-12-12 LAB — TYPE AND SCREEN
ABO/RH(D): O POS
Antibody Screen: NEGATIVE
Unit division: 0
Unit division: 0

## 2021-12-12 LAB — C-REACTIVE PROTEIN: CRP: 16.9 mg/dL — ABNORMAL HIGH (ref <1.0)

## 2021-12-12 LAB — KAPPA/LAMBDA LIGHT CHAINS
Kappa free light chain: 28.9 mg/L — ABNORMAL HIGH (ref 3.3–19.4)
Kappa, lambda light chain ratio: 0.42 (ref 0.26–1.65)
Lambda free light chains: 68.1 mg/L — ABNORMAL HIGH (ref 5.7–26.3)

## 2021-12-12 LAB — IRON AND TIBC
Iron: 40 ug/dL — ABNORMAL LOW (ref 45–182)
Saturation Ratios: 16 % — ABNORMAL LOW (ref 17.9–39.5)
TIBC: 252 ug/dL (ref 250–450)
UIBC: 212 ug/dL

## 2021-12-12 LAB — SEDIMENTATION RATE: Sed Rate: 74 mm/hr — ABNORMAL HIGH (ref 0–16)

## 2021-12-12 LAB — PATHOLOGIST SMEAR REVIEW

## 2021-12-12 LAB — FERRITIN: Ferritin: 986 ng/mL — ABNORMAL HIGH (ref 24–336)

## 2021-12-12 LAB — HAPTOGLOBIN: Haptoglobin: 300 mg/dL (ref 32–363)

## 2021-12-12 LAB — PROCALCITONIN: Procalcitonin: 0.16 ng/mL

## 2021-12-12 MED ORDER — ENSURE ENLIVE PO LIQD
237.0000 mL | Freq: Two times a day (BID) | ORAL | Status: DC
  Administered 2021-12-13 – 2021-12-20 (×16): 237 mL via ORAL

## 2021-12-12 NOTE — Consult Note (Signed)
...   HEMATOLOGY/ONCOLOGY CONSULTATION NOTE  Date of Service: 12/12/2021  Patient Care Team: [name], FNP as PCP - General (Family Medicine)  CHIEF COMPLAINTS/PURPOSE OF CONSULTATION:  pancytopenia  HISTORY OF PRESENTING ILLNESS:   Tyler [name] is a  71 y.o. male who has been referred to [place] by Dr Bonnielee Haff MD for evaluation and management of pancytopenia..  Patient has a history of prostate cancer status post prostatectomy in 2021, history of bilateral pulmonary embolism provoked by his prostate surgery 2021, hypertension presented to the urgent care on 11/14/2021 with severe back pain thought to be possibly sciatica.  At the time he had shooting pain in his right leg.  X-ray of the lumbar spine showed no evidence of malignancy and just degenerative changes.  He has been taking a fair amount of Tylenol and ibuprofen and was started on baclofen for additional symptom relief. Patient notes that he has also been using a fair amount of alcohol 2 to 3- 40 ounce beers daily.  Patient subsequently presented to the urgent care center again on 11/28/2021 with abdominal pain and nausea and vomiting.  The pain was in the right upper abdomen 8 out of 10 in intensity with food aversion.  He continued to have right sciatica pain.  Some concerns with constipation likely from the baclofen.  CT abdomen pelvis was done with contrast which showed no acute findings in the abdomen or pelvis. Labs done on 11/28/2021 showed normal WBC count of 5.2k hemoglobin of 9.2 with an MCV of 74.9 and platelets of 75k CMP showed a decrease in albumin to 2.5.  Normal creatinine of 1.14 no overt transaminitis but normal AST ALT and alkaline phosphatase.  Bilirubin was elevated at 1.9.  He subsequently started developing fevers and noted some change in the color of urine and possible blood in the urine. He had been on Eliquis for anticoagulation for his bilateral pulmonary emboli since 2001 and was taken off of this  about 3 months ago by his urologist.  In the emergency room the patient had hemoglobin of 6.2 with an MCV of 90.2, WBC count of 3.8k, platelets of 78k. CMP with decreased albumin to 2.5 bilirubin of 1.9.  Lipase within normal limits elevated lactic acid of 5 UA which showed hazy urine which was nitrite positive but no significant increased WBCs and no overt hematuria. Urine culture pending. Fecal occult blood testing was negative Blood cultures were collected and are currently pending HIV nonreactive LDH levels noted to be elevated at 703.  Repeat LDH 901 with normal haptoglobin level of 300 suggesting against hemolysis. Coombs test negative for complement and IgG Prothrombin time elevated at 17.4 B12 within normal limits at 348 Folate normal at 21.4 TSH 1.96 DIC panel showed no schistocytes platelets of 66k elevated fibrinogen level Hemogram electrophoresis ordered and pending. Reticulocyte panel shows significant reticulocytosis of 6.5% with an absolute count of 193 K and increase in immature reticulocyte fraction  MEDICAL HISTORY:  Past Medical History:  Diagnosis Date   Cancer ([place])    prostate   Hypertension   Bilateral pulmonary embolism provoked by radical prostatectomy.  SURGICAL HISTORY: Past Surgical History:  Procedure Laterality Date   LYMPHADENECTOMY Bilateral 09/01/2019   Procedure: LYMPHADENECTOMY;  Surgeon: Alexis Frock, MD;  Location: [facility];  Service: Urology;  Laterality: Bilateral;   ROBOT ASSISTED LAPAROSCOPIC RADICAL PROSTATECTOMY N/A 09/01/2019   Procedure: XI ROBOTIC ASSISTED LAPAROSCOPIC RADICAL PROSTATECTOMY;  Surgeon: Alexis Frock, MD;  Location: [facility];  Service: Urology;  Laterality: N/A;  3 HRS    SOCIAL HISTORY: Social History   Socioeconomic History   Marital status: Married    Spouse name: Not on file   Number of children: Not on file   Years of education: Not on file   Highest education level: Not on file  Occupational History    Not on file  Tobacco Use   Smoking status: Never   Smokeless tobacco: Never  Vaping Use   Vaping Use: Never used  Substance and Sexual Activity   Alcohol use: Yes    Comment: at least one 40 oz beer daily   Drug use: Never   Sexual activity: Not on file  Other Topics Concern   Not on file  Social History Narrative   Not on file   Social Determinants of Health   Financial Resource Strain: Not on file  Food Insecurity: Not on file  Transportation Needs: Not on file  Physical Activity: Not on file  Stress: Not on file  Social Connections: Not on file  Intimate Partner Violence: Not on file    FAMILY HISTORY: Family History  Problem Relation Age of Onset   Stroke Father     ALLERGIES:  is allergic to amlodipine.  MEDICATIONS:  Current Facility-Administered Medications  Medication Dose Route Frequency Provider Last Rate Last Admin   acetaminophen (TYLENOL) tablet 650 mg  650 mg Oral Q6H PRN Bonnielee Haff, MD   650 mg at 12/11/21 1745   Or   acetaminophen (TYLENOL) suppository 650 mg  650 mg Rectal Q6H PRN Bonnielee Haff, MD       ceFEPIme (MAXIPIME) 2 g in sodium chloride 0.9 % 100 mL IVPB  2 g Intravenous Q8H Bonnielee Haff, MD 200 mL/hr at 12/12/21 0500 2 g at 91/47/82 9562   folic acid (FOLVITE) tablet 1 mg  1 mg Oral Daily Bonnielee Haff, MD   1 mg at 12/11/21 1102   LORazepam (ATIVAN) tablet 1-4 mg  1-4 mg Oral Q1H PRN Bonnielee Haff, MD   1 mg at 12/11/21 0542   Or   LORazepam (ATIVAN) injection 1-4 mg  1-4 mg Intravenous Q1H PRN Bonnielee Haff, MD       multivitamin with minerals tablet 1 tablet  1 tablet Oral Daily Bonnielee Haff, MD   1 tablet at 12/11/21 1102   ondansetron (ZOFRAN) tablet 4 mg  4 mg Oral Q6H PRN Bonnielee Haff, MD       Or   ondansetron [facility]) injection 4 mg  4 mg Intravenous Q6H PRN Bonnielee Haff, MD       Oral care mouth rinse  15 mL Mouth Rinse PRN Bonnielee Haff, MD       pantoprazole (PROTONIX) injection 40 mg  40 mg  Intravenous Q12H Bonnielee Haff, MD   40 mg at 12/11/21 2033   thiamine (VITAMIN B1) tablet 100 mg  100 mg Oral Daily Bonnielee Haff, MD   100 mg at 12/11/21 1102   Or   thiamine (VITAMIN B1) injection 100 mg  100 mg Intravenous Daily Bonnielee Haff, MD       vancomycin (VANCOCIN) IVPB 1000 mg/200 mL premix  1,000 mg Intravenous Q24H Bonnielee Haff, MD   Stopped at 12/11/21 1622    REVIEW OF SYSTEMS:    10 Point review of Systems was done is negative except as noted above.  PHYSICAL EXAMINATION: ECOG PERFORMANCE STATUS: 2 - Symptomatic, <50% confined to bed  . Vitals:   12/11/21 2017 12/12/21 0456  BP: 117/76 128/81  Pulse: 82 88  Resp: 20 18  Temp: 98.6 F (37 C) 100.2 F (37.9 C)  SpO2: 100% 100%   Filed Weights   12/10/21 1424  Weight: 195 lb 1.7 oz (88.5 kg)   .Body mass index is 30.56 kg/m.  GENERAL:alert, in no acute distress and comfortable SKIN: no acute rashes, no significant lesions EYES: conjunctiva are pink and non-injected, sclera anicteric OROPHARYNX: MMM, no exudates, no oropharyngeal erythema or ulceration NECK: supple, no JVD LYMPH:  no palpable lymphadenopathy in the cervical, axillary or inguinal regions LUNGS: clear to auscultation b/l with normal respiratory effort HEART: regular rate & rhythm ABDOMEN:  normoactive bowel sounds , non tender, not distended. Extremity: no pedal edema PSYCH: alert & oriented x 3 with fluent speech NEURO: no focal motor/sensory deficits  LABORATORY DATA:  I have reviewed the data as listed  .    Latest Ref Rng & Units 12/12/2021    2:54 AM 12/11/2021   11:23 AM 12/11/2021    5:00 AM  CBC  WBC 4.0 - 10.5 K/uL 3.1   2.8   Hemoglobin 13.0 - 17.0 g/dL 8.4   7.4   Hematocrit 39.0 - 52.0 % 26.0   22.9   Platelets 150 - 400 K/uL 53  66  63     .    Latest Ref Rng & Units 12/12/2021    2:54 AM 12/11/2021   11:23 AM 12/11/2021    5:00 AM  CMP  Glucose 70 - 99 mg/dL 99   103   BUN 8 - 23 mg/dL 10   11    Creatinine 0.61 - 1.24 mg/dL 1.03   0.97   Sodium 135 - 145 mmol/L 137   136   Potassium 3.5 - 5.1 mmol/L 4.3   4.4   Chloride 98 - 111 mmol/L 105   106   CO2 22 - 32 mmol/L 21   21   Calcium 8.9 - 10.3 mg/dL 8.8   8.0   Total Protein 6.5 - 8.1 g/dL 5.9   5.2   Total Bilirubin 0.3 - 1.2 mg/dL 1.9  1.9  1.6   Alkaline Phos 38 - 126 U/L 97   87   AST 15 - 41 U/L 29   26   ALT 0 - 44 U/L 25   22      RADIOGRAPHIC STUDIES: I have personally reviewed the radiological images as listed and agreed with the findings in the report. MR LUMBAR SPINE WO CONTRAST  Result Date: 12/11/2021 CLINICAL DATA:  Low back pain EXAM: MRI LUMBAR SPINE WITHOUT CONTRAST TECHNIQUE: Multiplanar, multisequence MR imaging of the lumbar spine was performed. No intravenous contrast was administered. COMPARISON:  No prior MRI of the lumbar spine, correlation is made with 11/18/2021 lumbar spine radiographs and 12/10/2021 CT abdomen pelvis FINDINGS: Segmentation:  5 lumbar-type vertebral bodies. Alignment:  Mild levocurvature.  No listhesis. Vertebrae: No acute fracture or suspicious osseous lesion. Diffusely decreased marrow signal, which on T1 is slightly hyperintense to skeletal muscle, likely red marrow reconversion. Speckled marrow signal in the bilateral iliac bones and, to a lesser extent, vertebral bodies, which is favored to be benign given its appearance on the 12/10/2021 CT. Conus medullaris and cauda equina: Conus extends to the T12-L1 level. Conus and cauda equina appear normal. Paraspinal and other soft tissues: Right renal cyst, better evaluated on the prior CT, for which no follow-up is currently indicated. Disc levels: T12-L1: Disc desiccation without significant disc bulge. No spinal canal stenosis or  neural foraminal narrowing. L1-L2: No significant disc bulge. No spinal canal stenosis or neural foraminal narrowing. L2-L3: Disc desiccation and minimal disc bulge with central annular fissure. Mild facet  arthropathy. No spinal canal stenosis or neural foraminal narrowing. L3-L4: Disc desiccation and minimal disc bulge with left foraminal protrusion. Mild facet arthropathy. No spinal canal stenosis or neural foraminal narrowing. L4-L5: Disc desiccation and minimal disc bulge. Mild facet arthropathy. Epidural lipomatosis causes mild thecal sac narrowing at the disc level and moderate to severe thecal sac narrowing posterior to L5. No neural foraminal narrowing. L5-S1: No significant disc bulge. Mild facet arthropathy. Epidural lipomatosis, which causes severe thecal sac narrowing. No neural foraminal narrowing. IMPRESSION: 1. 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 No neural foraminal narrowing. 2. Diffusely decreased marrow signal, slightly hyperintense to skeletal muscle, likely red marrow reconversion. Additional speckled marrow signal in the bilateral iliac bones and, to a lesser extent, vertebral bodies, is favored to be benign given its appearance on the 12/10/2021 CT. A diffuse infiltrative bone marrow process is felt to be less likely. Consider correlation with serum labs. Electronically Signed   By: Merilyn Baba M.D.   On: 12/11/2021 13:07   CT ABDOMEN PELVIS W CONTRAST  Result Date: 12/10/2021 CLINICAL DATA:  LEFT lower quadrant abdominal pain.  Sepsis EXAM: CT ABDOMEN AND PELVIS WITH CONTRAST TECHNIQUE: Multidetector CT imaging of the abdomen and pelvis was performed using the standard protocol following bolus administration of intravenous contrast. RADIATION DOSE REDUCTION: This exam was performed according to the departmental dose-optimization program which includes automated exposure control, adjustment of the mA and/or kV according to patient size and/or use of iterative reconstruction technique. CONTRAST:  11m OMNIPAQUE  IOHEXOL 350 MG/ML SOLN COMPARISON:  None Available. FINDINGS: Lower chest: Lung bases are clear. Hepatobiliary: No focal hepatic lesion. No biliary duct dilatation. Common bile duct is normal. Pancreas: Pancreas is normal. No ductal dilatation. No pancreatic inflammation. Spleen: Normal spleen Adrenals/urinary tract: Adrenal glands normal. Bilateral simple fluid attenuation renal cysts. No follow-up recommended for Bosniak 1 renal cysts. Ureters and bladder normal. Stomach/Bowel: Stomach, small bowel, appendix, and cecum are normal. The colon and rectosigmoid colon are normal. Vascular/Lymphatic: Abdominal aorta is normal caliber. No periportal or retroperitoneal adenopathy. No pelvic adenopathy. Reproductive: Post prostatectomy Other: Prior LEFT inguinal hernia repair.  No evidence recurrence. Musculoskeletal: No aggressive osseous lesion. IMPRESSION: 1. No acute findings abdomen pelvis. 2. Prior LEFT inguinal hernia repair without complication. 3. Bilateral benign renal cysts.  No obstructive uropathy Electronically Signed   By: SSuzy BouchardM.D.   On: 12/10/2021 16:15   DG Chest 2 View  Result Date: 12/10/2021 CLINICAL DATA:  Chest pain. EXAM: CHEST - 2 VIEW COMPARISON:  Chest x-ray 09/07/2019. FINDINGS: The heart size and mediastinal contours are within normal limits. Both lungs are clear. No visible pleural effusions or pneumothorax. No acute osseous abnormality. IMPRESSION: No active cardiopulmonary disease. Electronically Signed   By: FMargaretha SheffieldM.D.   On: 12/10/2021 12:44   CT ABDOMEN PELVIS W CONTRAST  Result Date: 11/29/2021 CLINICAL DATA:  Abdominal pain and emesis EXAM: CT ABDOMEN AND PELVIS WITH CONTRAST TECHNIQUE: Multidetector CT imaging of the abdomen and pelvis was performed using the standard protocol following bolus administration of intravenous contrast. RADIATION DOSE REDUCTION: This exam was performed according to the departmental dose-optimization program which includes  automated exposure control, adjustment of the mA and/or kV according to patient size and/or use of iterative reconstruction technique. CONTRAST:  63m OMNIPAQUE IOHEXOL 350 MG/ML SOLN COMPARISON:  CT abdomen and pelvis dated July 13th 2021 FINDINGS: Lower chest: No acute abnormality. Hepatobiliary: No focal liver abnormality is seen. No gallstones, gallbladder wall thickening, or biliary dilatation. Pancreas: Unremarkable. No pancreatic ductal dilatation or surrounding inflammatory changes. Spleen: Normal in size without focal abnormality. Adrenals/Urinary Tract: Bilateral adrenal glands are unremarkable. No hydronephrosis or nephrolithiasis. Bilateral low-attenuation renal lesions largest are compatible with simple cysts, others are too small to completely characterize, no further follow-up imaging is recommended. Bladder is unremarkable. Stomach/Bowel: Stomach is within normal limits. Appendix appears normal. No evidence of bowel wall thickening, distention, or inflammatory changes. Vascular/Lymphatic: No significant vascular findings are present. No enlarged abdominal or pelvic lymph nodes. Reproductive: Prostate is surgically absent. Ovoid structures previously described along the pelvic sidewalls are no longer present, likely resolved hematomas. Other: No abdominal wall hernia or abnormality. No abdominopelvic ascites. Musculoskeletal: No acute or significant osseous findings. IMPRESSION: No acute findings in the abdomen or pelvis. Electronically Signed   By: LYetta GlassmanM.D.   On: 11/29/2021 08:52   DG Lumbar Spine Complete  Result Date: 11/18/2021 CLINICAL DATA:  Lumbosacral back pain and radiculopathy. EXAM: LUMBAR SPINE - COMPLETE 4+ VIEW COMPARISON:  CT of the abdomen September 07, 2019 FINDINGS: There is no evidence of lumbar spine fracture.  Alignment is normal. Multilevel osteoarthritic changes with disc space narrowing, endplate sclerosis and osteophytosis. Moderate posterior facet arthropathy in  the lower lumbosacral spine. IMPRESSION: 1. Multilevel osteoarthritic changes. 2. Moderate posterior facet arthropathy in the lower lumbosacral spine. Electronically Signed   By: DFidela SalisburyM.D.   On: 11/18/2021 18:16    ASSESSMENT & PLAN:   71year old male with history of prostate cancer status post radical prostatectomy in 2021, history of bilateral PE in 2021 related to surgery, hypertension with  #1 Microcytic anemia now normocytic with elevated reticulocyte count. Elevated LDH levels normal haptoglobin suggest against hemolysis. No hepatosplenomegaly on CT abdomen/pelvis. PSA levels on 11/27/2021 were undetectable suggesting against myelophthisic picture. Though there is no overt findings of liver disease he does have elevated bilirubin and cannot rule out underlying liver disease. He also has been using a fair amount of ibuprofen for his back pain which can cause pancytopenia. With significant reticulocytosis cannot rule out possible GI bleeding or other blood loss even though fecal occult blood testing x1 was negative.  #2 thrombocytopenia unknown how chronic this is platelet counts were in the 70k even a few weeks ago. No elevated immature platelet fraction No schistocytes on peripheral blood  #3 leukopenia without neutropenia  Plan -All the labs and imaging studies were reviewed in detail. -The etiology of the patient's pancytopenia could be multifactorial at this time including possible blood loss.  Ferritin levels are elevated in the context of significant inflammatory changes based on elevated sed rate and CRP.  Iron saturation is low at less than 20%. -B12 levels are within normal limits which could be overestimated if there is any liver injury. -Ibuprofen could also be a potential culprit for pancytopenia and worsening thrombocytopenia and would recommend that this be not used. -Patient also has had reportedly heavy alcohol use in the recent past consuming more than  100 ounces of beer daily which could be pan suppressive to his bone marrow. -Patient has possible urinary tract infection with elevated lactate levels suggestive of sepsis cultures currently pending  sepsis could also play into his cytopenias. -With low albumin levels his nutrition is also likely limiting. -Cannot rule out the possibility of another passing viral infection like EBV or CMV. -We will start on B complex to cover any subtle deficiencies, treat underlying sepsis/infection, absolute alcohol avoidance, absolutely avoid NSAIDs. -Patient is reticulocyte in well and hopefully will stabilize his hemoglobin levels. -Ordered and pending hemoglobin electrophoresis to rule out any underlying hemoglobinopathy. -Myeloma panel. -PSA levels were undetectable and low likelihood of a myelophthisic picture. -If the patient's pancytopenia continues to worsen or does not improve with the above then might need to consider a bone marrow examination.   The total time spent in the appointment was 81 minutes*.  All of the patient's questions were answered with apparent satisfaction. The patient knows to call the clinic with any problems, questions or concerns.   Sullivan Lone MD MS AAHIVMS [facility] Hematology/Oncology Physician [facility]  .*Total Encounter Time as defined by the Centers for Medicare and Medicaid Services includes, in addition to the face-to-face time of a patient visit (documented in the note above) non-face-to-face time: obtaining and reviewing outside history, ordering and reviewing medications, tests or procedures, care coordination (communications with other health care professionals or caregivers) and documentation in the medical record.

## 2021-12-12 NOTE — Progress Notes (Signed)
Initial Nutrition Assessment  DOCUMENTATION CODES:   Obesity unspecified  INTERVENTION:  - Add Ensure Enlive po BID, each supplement provides 350 kcal and 20 grams of protein.  NUTRITION DIAGNOSIS:   Inadequate oral intake related to poor appetite, vomiting, nausea as evidenced by meal completion < 50%.  GOAL:   Patient will meet greater than or equal to 90% of their needs  MONITOR:   PO intake, Supplement acceptance  REASON FOR ASSESSMENT:   Malnutrition Screening Tool    ASSESSMENT:   71 y.o. male admits related to fever and chills. PMH includes: prostate cancer, bilateral pulmonary embolism, and HTN. Pt is currently receiving medical management for sepsis/concern for UTI.  Meds include: folic acid, MVI, Vit B1. Labs reviewed.   Pt speaks some English. Pt reports that he has not been eating well since admission. He also reports that he has had a poor appetite for 2 weeks PTA. He states some nausea/vomiting associated with this. He denies any wt loss. He agrees to try Ensure BID. RD will add supplements and continue to monitor PO intakes.   NUTRITION - FOCUSED PHYSICAL EXAM:  Flowsheet Row Most Recent Value  Orbital Region No depletion  Upper Arm Region No depletion  Thoracic and Lumbar Region No depletion  Buccal Region No depletion  Temple Region No depletion  Clavicle Bone Region No depletion  Clavicle and Acromion Bone Region No depletion  Scapular Bone Region No depletion  Dorsal Hand No depletion  Patellar Region No depletion  Anterior Thigh Region No depletion  Posterior Calf Region No depletion  Edema (RD Assessment) None  Hair Reviewed  Eyes Reviewed  Mouth Reviewed  Skin Reviewed  Nails Reviewed       Diet Order:   Diet Order             Diet regular Room service appropriate? Yes; Fluid consistency: Thin  Diet effective now                   EDUCATION NEEDS:   Not appropriate for education at this time  Skin:  Skin Assessment:  Reviewed RN Assessment  Last BM:  12/10/21  Height:   Ht Readings from Last 1 Encounters:  12/10/21 '5\' 7"'$  (1.702 m)    Weight:   Wt Readings from Last 1 Encounters:  12/10/21 88.5 kg    Ideal Body Weight:  67.2 kg  BMI:  Body mass index is 30.56 kg/m.  Estimated Nutritional Needs:   Kcal:  2210-2655 kcals  Protein:  110-130 gm  Fluid:  2210-2655 mL  Thalia Bloodgood, RD, LDN, CNSC

## 2021-12-12 NOTE — Consult Note (Signed)
Reason for Consult:Back Pain worse over last 3 weeks Referring Physician: Shonn, Farruggia is an 71 y.o. male.  HPI: admitted for fevers and chills, abdominal pain, anemia. He underwent CT scan, blood work, MRI scan of lumbar spine,  On dual abx therapy vancomycin, and cefepime. I spoke with his daughter today, symptoms have worsened with regards to back pain over last three weeks. No particular mention of lower extremity pain. MRI performed shows epidural lipomatosis. Called for evaluation. Past Medical History:  Diagnosis Date   Cancer [facility])    prostate   Hypertension     Past Surgical History:  Procedure Laterality Date   LYMPHADENECTOMY Bilateral 09/01/2019   Procedure: LYMPHADENECTOMY;  Surgeon: Alexis Frock, MD;  Location: [facility];  Service: Urology;  Laterality: Bilateral;   ROBOT ASSISTED LAPAROSCOPIC RADICAL PROSTATECTOMY N/A 09/01/2019   Procedure: XI ROBOTIC ASSISTED LAPAROSCOPIC RADICAL PROSTATECTOMY;  Surgeon: Alexis Frock, MD;  Location: [facility];  Service: Urology;  Laterality: N/A;  3 HRS    Family History  Problem Relation Age of Onset   Stroke Father     Social History:  reports that he has never smoked. He has never used smokeless tobacco. He reports current alcohol use. He reports that he does not use drugs.  Allergies:  Allergies  Allergen Reactions   Amlodipine Swelling    BILATERAL FEET TO ANKLES    Medications: I have reviewed the patient's current medications.  Results for orders placed or performed during the hospital encounter of 12/10/21 (from the past 48 hour(s))  Lactic acid, plasma     Status: Abnormal   Collection Time: 12/10/21  2:16 PM  Result Value Ref Range   Lactic Acid, Venous 3.7 (HH) 0.5 - 1.9 mmol/L    Comment: CRITICAL VALUE NOTED. VALUE IS CONSISTENT WITH PREVIOUSLY REPORTED/CALLED VALUE Performed at [facility], [place] [address], [place], [place] [address]   Troponin I (High Sensitivity)     Status: None    Collection Time: 12/10/21  2:16 PM  Result Value Ref Range   Troponin I (High Sensitivity) 10 <18 ng/L    Comment: (NOTE) Elevated high sensitivity troponin I (hsTnI) values and significant  changes across serial measurements may suggest ACS but many other  chronic and acute conditions are known to elevate hsTnI results.  Refer to the "Links" section for chest pain algorithms and additional  guidance. Performed at [facility], [place] [address] [place], [place] [address]   CK     Status: Abnormal   Collection Time: 12/10/21  2:16 PM  Result Value Ref Range   Total CK 25 (L) 49 - 397 U/L    Comment: Performed at [facility], [place] [address], [place], [place] [address]  Type and screen [place]     Status: None   Collection Time: 12/10/21  2:16 PM  Result Value Ref Range   ABO/RH(D) O POS    Antibody Screen NEG    Sample Expiration 12/13/2021,2359    Unit Number [id number]    Blood Component Type RED CELLS,LR    Unit division 00    Status of Unit ISSUED,FINAL    Transfusion Status OK TO TRANSFUSE    Crossmatch Result Compatible    Unit Number [id number]    Blood Component Type RED CELLS,LR    Unit division 00    Status of Unit ISSUED,FINAL    Transfusion Status OK TO TRANSFUSE    Crossmatch Result  Compatible Performed at [facility], [place] [address], [place], [place] [address]   Prepare RBC (crossmatch)     Status: None   Collection Time: 12/10/21  2:16 PM  Result Value Ref Range   Order Confirmation      ORDER PROCESSED BY BLOOD BANK Performed at [facility], [place] [address] [place], [place] [address]   POC occult blood, ED     Status: None   Collection Time: 12/10/21  2:38 PM  Result Value Ref Range   Fecal Occult Bld NEGATIVE NEGATIVE  Lactic acid, plasma     Status: None   Collection Time: 12/10/21 11:30 PM  Result Value Ref Range   Lactic Acid, Venous 1.8 0.5 - 1.9 mmol/L    Comment: Performed at [place] [address], [place], [place] [address]  Magnesium     Status: None   Collection Time: 12/10/21 11:31 PM  Result Value Ref Range   Magnesium 1.9 1.7 - 2.4 mg/dL    Comment: Performed at [facility], [place] [address], [place], [place] [address]  Lactate dehydrogenase     Status: Abnormal   Collection Time: 12/10/21 11:31 PM  Result Value Ref Range   LDH 703 (H) 98 - 192 U/L    Comment: Performed at [facility], [place] [address] [place], [place] [address]  Technologist smear review     Status: None   Collection Time: 12/10/21 11:31 PM  Result Value Ref Range   WBC MORPHOLOGY MORPHOLOGY UNREMARKABLE    RBC MORPHOLOGY MORPHOLOGY UNREMARKABLE    Plt Morphology PLATELETS APPEAR DECREASED    Clinical Information Thrombocytopenia, anemia     Comment: Performed at [place] [address] [place], [place] [address]  Differential     Status: None   Collection Time: 12/10/21 11:31 PM  Result Value Ref Range   Neutrophils Relative % 54 %   Neutro Abs 1.7 1.7 - 7.7 K/uL   Lymphocytes Relative 33 %   Lymphs Abs 1.0 0.7 - 4.0 K/uL   Monocytes Relative 12 %   Monocytes Absolute 0.4 0.1 - 1.0 K/uL   Eosinophils Relative 0 %   Eosinophils Absolute 0.0 0.0 - 0.5 K/uL   Basophils Relative 0 %   Basophils Absolute 0.0 0.0 - 0.1 K/uL   Immature Granulocytes 1 %   Abs Immature Granulocytes 0.03 0.00 - 0.07 K/uL    Comment: Performed at [place] [address], [place], [place] [address]  CBC     Status: Abnormal   Collection Time: 12/10/21 11:31 PM  Result Value Ref Range   WBC 3.1 (L) 4.0 - 10.5 K/uL   RBC 2.23 (L) 4.22 - 5.81 MIL/uL   Hemoglobin 6.3 (LL) 13.0 - 17.0 g/dL    Comment: REPEATED TO VERIFY THIS CRITICAL RESULT HAS VERIFIED AND BEEN CALLED TO JULIE WHITLEY,RN BY PAMELA HENDERSON ON 10 16 2023 AT 6440, AND HAS BEEN READ BACK.     HCT 20.6 (L) 39.0 - 52.0 %   MCV 92.4 80.0 - 100.0 fL   MCH 28.3 26.0 - 34.0 pg   MCHC 30.6 30.0 - 36.0 g/dL    RDW 22.5 (H) 11.5 - 15.5 %   Platelets 66 (L) 150 - 400 K/uL    Comment: REPEATED TO VERIFY   nRBC 12.1 (H) 0.0 - 0.2 %    Comment: Performed at [place],  [place] [address]  Prepare RBC (crossmatch)     Status: None   Collection Time: 12/11/21 12:05 AM  Result Value Ref Range   Order Confirmation      ORDER PROCESSED BY BLOOD BANK Performed at [facility], [address] [place], [place] [address]   HIV Antibody (routine testing w rflx)     Status: None   Collection Time: 12/11/21  5:00 AM  Result Value Ref Range   HIV Screen 4th Generation wRfx Non Reactive Non Reactive    Comment: Performed at [facility], [place] [address], [place], [place] [address]  Magnesium     Status: None   Collection Time: 12/11/21  5:00 AM  Result Value Ref Range   Magnesium 1.7 1.7 - 2.4 mg/dL    Comment: Performed at [facility], [place] [address] [place], [place] [address]  Comprehensive metabolic panel     Status: Abnormal   Collection Time: 12/11/21  5:00 AM  Result Value Ref Range   Sodium 136 135 - 145 mmol/L   Potassium 4.4 3.5 - 5.1 mmol/L   Chloride 106 98 - 111 mmol/L   CO2 21 (L) 22 - 32 mmol/L   Glucose, Bld 103 (H) 70 - 99 mg/dL    Comment: Glucose reference range applies only to samples taken after fasting for at least 8 hours.   BUN 11 8 - 23 mg/dL   Creatinine, Ser 0.97 0.61 - 1.24 mg/dL   Calcium 8.0 (L) 8.9 - 10.3 mg/dL   Total Protein 5.2 (L) 6.5 - 8.1 g/dL   Albumin 2.1 (L) 3.5 - 5.0 g/dL   AST 26 15 - 41 U/L   ALT 22 0 - 44 U/L   Alkaline Phosphatase 87 38 - 126 U/L   Total Bilirubin 1.6 (H) 0.3 - 1.2 mg/dL   GFR, Estimated >60 >60 mL/min    Comment: (NOTE) Calculated using the CKD-EPI Creatinine Equation (2021)    Anion gap 9 5 - 15    Comment: Performed at [facility], [place] [address], [place], [place] [address]  Protime-INR     Status: Abnormal   Collection Time: 12/11/21  5:00 AM  Result Value Ref Range    Prothrombin Time 17.4 (H) 11.4 - 15.2 seconds   INR 1.4 (H) 0.8 - 1.2    Comment: (NOTE) INR goal varies based on device and disease states. Performed at [facility], [place] [address] [place], [place] [address]   CBC with Differential/Platelet     Status: Abnormal   Collection Time: 12/11/21  5:00 AM  Result Value Ref Range   WBC 2.8 (L) 4.0 - 10.5 K/uL   RBC 2.62 (L) 4.22 - 5.81 MIL/uL   Hemoglobin 7.4 (L) 13.0 - 17.0 g/dL   HCT 22.9 (L) 39.0 - 52.0 %   MCV 87.4 80.0 - 100.0 fL   MCH 28.2 26.0 - 34.0 pg   MCHC 32.3 30.0 - 36.0 g/dL   RDW 21.1 (H) 11.5 - 15.5 %   Platelets 63 (L) 150 - 400 K/uL    Comment: REPEATED TO VERIFY   nRBC 11.0 (H) 0.0 - 0.2 %   Neutrophils Relative % 65 %   Neutro Abs 1.8 1.7 - 7.7 K/uL   Lymphocytes Relative 24 %   Lymphs Abs 0.7 0.7 - 4.0 K/uL   Monocytes Relative 9 %   Monocytes Absolute 0.3 0.1 - 1.0 K/uL   Eosinophils Relative 0 %  Eosinophils Absolute 0.0 0.0 - 0.5 K/uL   Basophils Relative 1 %   Basophils Absolute 0.0 0.0 - 0.1 K/uL   WBC Morphology MORPHOLOGY UNREMARKABLE    RBC Morphology See Note    Smear Review PLATELETS APPEAR DECREASED    Immature Granulocytes 1 %   Abs Immature Granulocytes 0.02 0.00 - 0.07 K/uL   Polychromasia PRESENT     Comment: Performed at [place] [address], [place], [place] [address]  Vitamin B12     Status: None   Collection Time: 12/11/21  5:00 AM  Result Value Ref Range   Vitamin B-12 348 180 - 914 pg/mL    Comment: (NOTE) This assay is not validated for testing neonatal or myeloproliferative syndrome specimens for Vitamin B12 levels. Performed at [facility], [place] [address], [place], [place] [address]   Folate     Status: None   Collection Time: 12/11/21  5:00 AM  Result Value Ref Range   Folate 21.4 >5.9 ng/mL    Comment: Performed at [facility], [place] [address] [place], [place] [address]  TSH     Status: None   Collection Time: 12/11/21  5:00 AM  Result  Value Ref Range   TSH 1.963 0.350 - 4.500 uIU/mL    Comment: Performed by a 3rd Generation assay with a functional sensitivity of <=0.01 uIU/mL. Performed at [facility], [place] [address] [place], [place] [address]   Procalcitonin - Baseline     Status: None   Collection Time: 12/11/21  5:00 AM  Result Value Ref Range   Procalcitonin 0.20 ng/mL    Comment:        Interpretation: PCT (Procalcitonin) <= 0.5 ng/mL: Systemic infection (sepsis) is not likely. Local bacterial infection is possible. (NOTE)       Sepsis PCT Algorithm           Lower Respiratory Tract                                      Infection PCT Algorithm    ----------------------------     ----------------------------         PCT < 0.25 ng/mL                PCT < 0.10 ng/mL          Strongly encourage             Strongly discourage   discontinuation of antibiotics    initiation of antibiotics    ----------------------------     -----------------------------       PCT 0.25 - 0.50 ng/mL            PCT 0.10 - 0.25 ng/mL               OR       >80% decrease in PCT            Discourage initiation of                                            antibiotics      Encourage discontinuation           of antibiotics    ----------------------------     -----------------------------  PCT >= 0.50 ng/mL              PCT 0.26 - 0.50 ng/mL               AND        <80% decrease in PCT             Encourage initiation of                                             antibiotics       Encourage continuation           of antibiotics    ----------------------------     -----------------------------        PCT >= 0.50 ng/mL                  PCT > 0.50 ng/mL               AND         increase in PCT                  Strongly encourage                                      initiation of antibiotics    Strongly encourage escalation           of antibiotics                                     -----------------------------                                            PCT <= 0.25 ng/mL                                                 OR                                        > 80% decrease in PCT                                      Discontinue / Do not initiate                                             antibiotics  Performed at [facility], [address], [place], [place] [address]   Urine Culture     Status: None   Collection Time: 12/11/21  6:55 AM   Specimen: Urine, Clean Catch  Result Value Ref Range   Specimen Description URINE, CLEAN CATCH    Special Requests NONE    Culture  NO GROWTH Performed at [facility], [place] [address] [place], [place] [address]    Report Status 12/12/2021 FINAL   Culture, blood (Routine X 2) w Reflex to ID Panel     Status: None (Preliminary result)   Collection Time: 12/11/21  6:55 AM   Specimen: BLOOD RIGHT HAND  Result Value Ref Range   Specimen Description BLOOD RIGHT HAND    Special Requests      BOTTLES DRAWN AEROBIC AND ANAEROBIC Blood Culture adequate volume   Culture      NO GROWTH < 24 HOURS Performed at [facility], [place] [address] [place], [place] [address]    Report Status PENDING   Bilirubin, fractionated(tot/dir/indir)     Status: Abnormal   Collection Time: 12/11/21 11:23 AM  Result Value Ref Range   Total Bilirubin 1.9 (H) 0.3 - 1.2 mg/dL   Bilirubin, Direct 0.5 (H) 0.0 - 0.2 mg/dL   Indirect Bilirubin 1.4 (H) 0.3 - 0.9 mg/dL    Comment: Performed at [facility] [address] [place], [place] [address]  Lactate dehydrogenase     Status: Abnormal   Collection Time: 12/11/21 11:23 AM  Result Value Ref Range   LDH 901 (H) 98 - 192 U/L    Comment: Performed at [facility], [place] [address], [place], [place] [address]  Haptoglobin     Status: None   Collection Time: 12/11/21 11:23 AM  Result Value Ref Range   Haptoglobin 300 32 - 363 mg/dL    Comment: (NOTE) Performed At: [facility] [place], [place] [id number] Rush Farmer MD [id number]   Immature Platelet Fraction     Status: None   Collection Time: 12/11/21 11:23 AM  Result Value Ref Range   Immature Platelet Fraction 5.1 1.2 - 8.6 %    Comment: Performed at [facility], [place] [address], [place], [place] [address]  Retic Panel     Status: Abnormal   Collection Time: 12/11/21 11:23 AM  Result Value Ref Range   Retic Ct Pct 6.5 (H) 0.4 - 3.1 %   RBC. 2.99 (L) 4.22 - 5.81 MIL/uL   Retic Count, Absolute 193.8 (H) 19.0 - 186.0 K/uL   Immature Retic Fract 29.4 (H) 2.3 - 15.9 %   Reticulocyte Hemoglobin 27.6 (L) >27.9 pg    Comment:        A RET-He < 28 pg is an indication of iron-deficient or iron- insufficient erythropoiesis. Patients with thalassemia may also have a decreased RET-He result unrelated to iron availability.     If this patient has chronic kidney disease and does not have a hemoglobinopathy he/she meets criteria for iron deficiency per the 2016 NICE guidelines. Refer to specific guidelines to determine the appropriate thresholds for treating CKD- associated iron deficiency. TSAT and ferritin should be used in patients with hemoglobinopathies (e.g. thalassemia). Performed at [facility], [place] [address] [place], [place] [address]   DIC Panel ONCE - STAT     Status: Abnormal   Collection Time: 12/11/21 11:23 AM  Result Value Ref Range   Prothrombin Time 17.1 (H) 11.4 - 15.2 seconds   INR 1.4 (H) 0.8 - 1.2    Comment: (NOTE) INR goal varies based on device and disease states.    aPTT 30 24 - 36 seconds   Fibrinogen 684 (H) 210 - 475 mg/dL    Comment: (NOTE) Fibrinogen results may be underestimated in patients receiving  thrombolytic therapy.    D-Dimer, Quant 5.52 (H) 0.00 - 0.50 ug/mL-FEU    Comment: (NOTE) At the manufacturer cut-off value of 0.5 g/mL FEU, this assay has a negative predictive value of 95-100%.This assay is intended for use in conjunction with  a clinical pretest probability (PTP) assessment model to exclude pulmonary embolism (PE) and deep venous thrombosis (DVT) in outpatients suspected of PE or DVT. Results should be correlated with clinical presentation.    Platelets 66 (L) 150 - 400 K/uL    Comment: Immature Platelet Fraction may be clinically indicated, consider ordering this additional test [id number] REPEATED TO VERIFY    Smear Review NO SCHISTOCYTES SEEN     Comment: Performed at [facility], [place] [address], [place], [place] [address]  Culture, blood (Routine X 2) w Reflex to ID Panel     Status: None (Preliminary result)   Collection Time: 12/11/21  6:02 PM   Specimen: BLOOD  Result Value Ref Range   Specimen Description BLOOD LEFT ANTECUBITAL    Special Requests      BOTTLES DRAWN AEROBIC AND ANAEROBIC Blood Culture adequate volume   Culture      NO GROWTH < 24 HOURS Performed at [facility], [place] [address] [place], [place] [address]    Report Status PENDING   Direct antiglobulin test     Status: None   Collection Time: 12/11/21  6:02 PM  Result Value Ref Range   DAT, complement NEG    DAT, IgG      NEG Performed at [facility] [address] [place], [place] [address]   Procalcitonin     Status: None   Collection Time: 12/12/21  2:54 AM  Result Value Ref Range   Procalcitonin 0.16 ng/mL    Comment:        Interpretation: PCT (Procalcitonin) <= 0.5 ng/mL: Systemic infection (sepsis) is not likely. Local bacterial infection is possible. (NOTE)       Sepsis PCT Algorithm           Lower Respiratory Tract                                      Infection PCT Algorithm    ----------------------------     ----------------------------         PCT < 0.25 ng/mL                PCT < 0.10 ng/mL          Strongly encourage             Strongly discourage   discontinuation of antibiotics    initiation of antibiotics    ----------------------------     -----------------------------        PCT 0.25 - 0.50 ng/mL            PCT 0.10 - 0.25 ng/mL               OR       >80% decrease in PCT            Discourage initiation of                                            antibiotics      Encourage discontinuation  of antibiotics    ----------------------------     -----------------------------         PCT >= 0.50 ng/mL              PCT 0.26 - 0.50 ng/mL               AND        <80% decrease in PCT             Encourage initiation of                                             antibiotics       Encourage continuation           of antibiotics    ----------------------------     -----------------------------        PCT >= 0.50 ng/mL                  PCT > 0.50 ng/mL               AND         increase in PCT                  Strongly encourage                                      initiation of antibiotics    Strongly encourage escalation           of antibiotics                                     -----------------------------                                           PCT <= 0.25 ng/mL                                                 OR                                        > 80% decrease in PCT                                      Discontinue / Do not initiate                                             antibiotics  Performed at [facility], [address] [place], [place] [address]   Sedimentation rate     Status: Abnormal   Collection Time: 12/12/21  2:54 AM  Result Value Ref Range   Sed Rate 74 (H) 0 - 16  mm/hr    Comment: Performed at [facility], [place] [address], [place], [place] [address]  C-reactive protein     Status: Abnormal   Collection Time: 12/12/21  2:54 AM  Result Value Ref Range   CRP 16.9 (H) <1.0 mg/dL    Comment: Performed at [facility] [address], [place], [place] [address]  Iron and TIBC     Status: Abnormal   Collection Time: 12/12/21  2:54 AM  Result Value Ref Range   Iron 40 (L) 45 - 182 ug/dL   TIBC 252  250 - 450 ug/dL   Saturation Ratios 16 (L) 17.9 - 39.5 %   UIBC 212 ug/dL    Comment: Performed at [facility], [place] [address], [place], [place] [address]  Ferritin     Status: Abnormal   Collection Time: 12/12/21  2:54 AM  Result Value Ref Range   Ferritin 986 (H) 24 - 336 ng/mL    Comment: Performed at [facility], [place] [address] [place], [place] [address]  Reticulocytes     Status: Abnormal   Collection Time: 12/12/21  2:54 AM  Result Value Ref Range   Retic Ct Pct 6.7 (H) 0.4 - 3.1 %   RBC. 3.01 (L) 4.22 - 5.81 MIL/uL   Retic Count, Absolute 202.0 (H) 19.0 - 186.0 K/uL   Immature Retic Fract 28.7 (H) 2.3 - 15.9 %    Comment: Performed at [facility] [address], [place], [place] [address]  CBC with Differential/Platelet     Status: Abnormal   Collection Time: 12/12/21  2:54 AM  Result Value Ref Range   WBC 3.1 (L) 4.0 - 10.5 K/uL   RBC 2.96 (L) 4.22 - 5.81 MIL/uL   Hemoglobin 8.4 (L) 13.0 - 17.0 g/dL   HCT 26.0 (L) 39.0 - 52.0 %   MCV 87.8 80.0 - 100.0 fL   MCH 28.4 26.0 - 34.0 pg   MCHC 32.3 30.0 - 36.0 g/dL   RDW 21.9 (H) 11.5 - 15.5 %   Platelets 53 (L) 150 - 400 K/uL    Comment: REPEATED TO VERIFY   nRBC 7.5 (H) 0.0 - 0.2 %   Neutrophils Relative % 58 %   Neutro Abs 1.8 1.7 - 7.7 K/uL   Lymphocytes Relative 33 %   Lymphs Abs 1.0 0.7 - 4.0 K/uL   Monocytes Relative 8 %   Monocytes Absolute 0.3 0.1 - 1.0 K/uL   Eosinophils Relative 0 %   Eosinophils Absolute 0.0 0.0 - 0.5 K/uL   Basophils Relative 0 %   Basophils Absolute 0.0 0.0 - 0.1 K/uL   Immature Granulocytes 1 %   Abs Immature Granulocytes 0.02 0.00 - 0.07 K/uL    Comment: Performed at [facility], [place] [address], [place], [place] [address]  Comprehensive metabolic panel     Status: Abnormal   Collection Time: 12/12/21  2:54 AM  Result Value Ref Range   Sodium 137 135 - 145 mmol/L   Potassium 4.3 3.5 - 5.1 mmol/L   Chloride 105 98 - 111 mmol/L   CO2 21 (L) 22 - 32 mmol/L    Glucose, Bld 99 70 - 99 mg/dL    Comment: Glucose reference range applies only to samples taken after fasting for at least 8 hours.   BUN 10 8 - 23 mg/dL   Creatinine, Ser 1.03 0.61 - 1.24 mg/dL   Calcium 8.8 (L) 8.9 - 10.3  mg/dL   Total Protein 5.9 (L) 6.5 - 8.1 g/dL   Albumin 2.3 (L) 3.5 - 5.0 g/dL   AST 29 15 - 41 U/L   ALT 25 0 - 44 U/L   Alkaline Phosphatase 97 38 - 126 U/L   Total Bilirubin 1.9 (H) 0.3 - 1.2 mg/dL   GFR, Estimated >60 >60 mL/min    Comment: (NOTE) Calculated using the CKD-EPI Creatinine Equation (2021)    Anion gap 11 5 - 15    Comment: Performed at [facility] [address] [place], [place] [address]    MR LUMBAR SPINE WO CONTRAST  Result Date: 12/11/2021 CLINICAL DATA:  Low back pain EXAM: MRI LUMBAR SPINE WITHOUT CONTRAST TECHNIQUE: Multiplanar, multisequence MR imaging of the lumbar spine was performed. No intravenous contrast was administered. COMPARISON:  No prior MRI of the lumbar spine, correlation is made with 11/18/2021 lumbar spine radiographs and 12/10/2021 CT abdomen pelvis FINDINGS: Segmentation:  5 lumbar-type vertebral bodies. Alignment:  Mild levocurvature.  No listhesis. Vertebrae: No acute fracture or suspicious osseous lesion. Diffusely decreased marrow signal, which on T1 is slightly hyperintense to skeletal muscle, likely red marrow reconversion. Speckled marrow signal in the bilateral iliac bones and, to a lesser extent, vertebral bodies, which is favored to be benign given its appearance on the 12/10/2021 CT. Conus medullaris and cauda equina: Conus extends to the T12-L1 level. Conus and cauda equina appear normal. Paraspinal and other soft tissues: Right renal cyst, better evaluated on the prior CT, for which no follow-up is currently indicated. Disc levels: T12-L1: Disc desiccation without significant disc bulge. No spinal canal stenosis or neural foraminal narrowing. L1-L2: No significant disc bulge. No spinal canal stenosis or  neural foraminal narrowing. L2-L3: Disc desiccation and minimal disc bulge with central annular fissure. Mild facet arthropathy. No spinal canal stenosis or neural foraminal narrowing. L3-L4: Disc desiccation and minimal disc bulge with left foraminal protrusion. Mild facet arthropathy. No spinal canal stenosis or neural foraminal narrowing. L4-L5: Disc desiccation and minimal disc bulge. Mild facet arthropathy. Epidural lipomatosis causes mild thecal sac narrowing at the disc level and moderate to severe thecal sac narrowing posterior to L5. No neural foraminal narrowing. L5-S1: No significant disc bulge. Mild facet arthropathy. Epidural lipomatosis, which causes severe thecal sac narrowing. No neural foraminal narrowing. IMPRESSION: 1. 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 No neural foraminal narrowing. 2. Diffusely decreased marrow signal, slightly hyperintense to skeletal muscle, likely red marrow reconversion. Additional speckled marrow signal in the bilateral iliac bones and, to a lesser extent, vertebral bodies, is favored to be benign given its appearance on the 12/10/2021 CT. A diffuse infiltrative bone marrow process is felt to be less likely. Consider correlation with serum labs. Electronically Signed   By: Merilyn Baba M.D.   On: 12/11/2021 13:07   CT ABDOMEN PELVIS W CONTRAST  Result Date: 12/10/2021 CLINICAL DATA:  LEFT lower quadrant abdominal pain.  Sepsis EXAM: CT ABDOMEN AND PELVIS WITH CONTRAST TECHNIQUE: Multidetector CT imaging of the abdomen and pelvis was performed using the standard protocol following bolus administration of intravenous contrast. RADIATION DOSE REDUCTION: This exam was performed according to the departmental dose-optimization program which includes automated exposure control, adjustment of  the mA and/or kV according to patient size and/or use of iterative reconstruction technique. CONTRAST:  19m OMNIPAQUE  IOHEXOL 350 MG/ML SOLN COMPARISON:  None Available. FINDINGS: Lower chest: Lung bases are clear. Hepatobiliary: No focal hepatic lesion. No biliary duct dilatation. Common bile duct is normal. Pancreas: Pancreas is normal. No ductal dilatation. No pancreatic inflammation. Spleen: Normal spleen Adrenals/urinary tract: Adrenal glands normal. Bilateral simple fluid attenuation renal cysts. No follow-up recommended for Bosniak 1 renal cysts. Ureters and bladder normal. Stomach/Bowel: Stomach, small bowel, appendix, and cecum are normal. The colon and rectosigmoid colon are normal. Vascular/Lymphatic: Abdominal aorta is normal caliber. No periportal or retroperitoneal adenopathy. No pelvic adenopathy. Reproductive: Post prostatectomy Other: Prior LEFT inguinal hernia repair.  No evidence recurrence. Musculoskeletal: No aggressive osseous lesion. IMPRESSION: 1. No acute findings abdomen pelvis. 2. Prior LEFT inguinal hernia repair without complication. 3. Bilateral benign renal cysts.  No obstructive uropathy Electronically Signed   By: Suzy Bouchard M.D.   On: 12/10/2021 16:15    Review of Systems Blood pressure 108/68, pulse 89, temperature 98.1 F (36.7 C), temperature source Oral, resp. rate 18, height _0  (1.702 m), weight 88.5 kg, SpO2 100 %. Physical Exam Constitutional:      Appearance: He is well-developed.     Comments: Patient does not speak nor understand english. Exam therefore is limited  HENT:     Head: Normocephalic and atraumatic.  Eyes:     Extraocular Movements: Extraocular movements intact.     Pupils: Pupils are equal, round, and reactive to light.  Cardiovascular:     Rate and Rhythm: Normal rate.  Pulmonary:     Effort: Pulmonary effort is normal.  Musculoskeletal:        General: Normal range of motion.     Cervical back: Normal range of motion.  Skin:     General: Skin is warm and dry.  Neurological:     General: No focal deficit present.     Mental Status: He is alert and oriented to person, place, and time.     Cranial Nerves: No cranial nerve deficit.     Sensory: Sensation is intact.     Motor: Motor function is intact.     Comments: Did not get patient out of bed.  Moving all extremities      Assessment/Plan: Vernice Bowker is a 71 y.o. male With back pain. It is unlikely that the epidural lipomatosis is responsible for such a dramatic and rapid decline. He is not an operative candidate at this time, nor do I believe the mri provides an etiology for the described pain. I spoke with his daughter and explained my reasoning.   Ashok Pall 12/12/2021, 2:07 PM

## 2021-12-12 NOTE — Progress Notes (Signed)
PROGRESS NOTE    Tyler [name]  JKK:[id number] DOB: December 13, 1950 DOA: 12/10/2021 PCP: [name], FNP   Brief Narrative:  71 y.o. male with a past medical history of prostate cancer status post prostatectomy in 2021, history of bilateral pulmonary embolism after his surgery in 2021, essential hypertension presented with worsening back pain radiating down to right leg along with fevers and hematuria.  On presentation, his hemoglobin was 6.3.  He was started on broad-spectrum antibiotics for possible UTI.  Assessment & Plan:   Sepsis: Present on admission Possible UTI: Present on admission -Presented with positive nitrites in UA but no pyuria and only a few bacteria.  Blood and urine cultures negative so far -Currently on cefepime and vancomycin.  Tmax of 100.5 over the last 24 hours. -Has significantly elevated CRP and has pancytopenia.  Unclear if the fever is from an underlying hematological process -CT of abdomen and pelvis was negative for any acute findings. -MRI of lumbar spine did not show signs of osteomyelitis  Pancytopenia with symptomatic anemia -Status post 2 units packed red cell transfusion during this hospitalization.  Hemoglobin 8.4 this morning.  Monitor H&H.  No overt signs of bleeding. -Oncology has been consulted. -Normal vitamin B12 and folate levels. -No overt signs of bleeding.  Platelets 53 today.  Abdominal pain -Lab is normal.  LFTs almost unremarkable.  CT of abdomen and pelvis did not show any acute findings.  Continue PPI for now.  Abdominal improving.  Severe lower back pain radiating to her right lower extremity Lumbosacral stenosis -Still having significant lower back pain radiating to right lower extremity.  MRI of lumbar spine showed moderate to severe thecal sac narrowing posterior to L5 and severe thecal sac narrowing of L5-S1.   -I have consulted neurosurgery: Follow recommendations  History of bilateral PE in 2021 -He was on Eliquis up until  2 to 3 months ago when it was discontinued by his urologist.  It was a provoked PE after his prostatectomy in 2021.  Alcohol abuse -Heavy consumption in the last 2 to 3 months per patient's daughter.  No evidence of withdrawal currently.  Continue CIWA protocol.  Continue thiamine, multivitamins and folic acid.  History of prostate cancer status post prostatectomy in 2021 -Followed by Dr. [name] with urology.  PSA was undetectable earlier this month.  Results available in care everywhere.  Obesity -Outpatient follow-up  DVT prophylaxis: SCDs Code Status: Full Family Communication: Wife at bedside.  Daughter on phone Disposition Plan: Status is: Inpatient Remains inpatient appropriate because: Of severity of illness.  Consultants: Oncology  Procedures: None  Antimicrobials: Cefepime and vancomycin from 12/10/2021 onwards   Subjective: Patient seen and examined at bedside.  Complains of lower back pain radiating to right lower extremity and sometimes to his upper back and neck.  Still had fever yesterday.  No worsening abdominal pain, vomiting or diarrhea reported.  Objective: Vitals:   12/11/21 1833 12/11/21 2017 12/12/21 0456 12/12/21 1009  BP:  117/76 128/81 108/68  Pulse:  82 88 89  Resp:  20 18   Temp: (!) 100.5 F (38.1 C) 98.6 F (37 C) 100.2 F (37.9 C) 98.1 F (36.7 C)  TempSrc: Oral Oral Oral Oral  SpO2:  100% 100% 100%  Weight:      Height:        Intake/Output Summary (Last 24 hours) at 12/12/2021 1129 Last data filed at 12/12/2021 0900 Gross per 24 hour  Intake 3307.01 ml  Output 1250 ml  Net 2057.01 ml  Filed Weights   12/10/21 1424  Weight: 88.5 kg    Examination:  General exam: Appears calm and comfortable.  Currently on room air. Respiratory system: Bilateral decreased breath sounds at bases with scattered crackles Cardiovascular system: S1 & S2 heard, Rate controlled Gastrointestinal system: Abdomen is nondistended, soft and nontender.  Normal bowel sounds heard. Extremities: No cyanosis, clubbing; trace lower extremity edema  Central nervous system: Alert and awake.  No focal neurological deficits. Moving extremities Skin: No rashes, lesions or ulcers Psychiatry: Flat affect.  No signs of agitation    Data Reviewed: I have personally reviewed following labs and imaging studies  CBC: Recent Labs  Lab 12/10/21 1219 12/10/21 2331 12/11/21 0500 12/11/21 1123 12/12/21 0254  WBC 3.8* 3.1* 2.8*  --  3.1*  NEUTROABS  --  1.7 1.8  --  1.8  HGB 6.2* 6.3* 7.4*  --  8.4*  HCT 20.2* 20.6* 22.9*  --  26.0*  MCV 90.2 92.4 87.4  --  87.8  PLT 78* 66* 63* 66* 53*   Basic Metabolic Panel: Recent Labs  Lab 12/10/21 1219 12/10/21 2331 12/11/21 0500 12/12/21 0254  NA 133*  --  136 137  K 3.9  --  4.4 4.3  CL 100  --  106 105  CO2 20*  --  21* 21*  GLUCOSE 148*  --  103* 99  BUN 13  --  11 10  CREATININE 1.14  --  0.97 1.03  CALCIUM 8.4*  --  8.0* 8.8*  MG  --  1.9 1.7  --    GFR: Estimated Creatinine Clearance: 71.9 mL/min (by C-G formula based on SCr of 1.03 mg/dL). Liver Function Tests: Recent Labs  Lab 12/10/21 1219 12/11/21 0500 12/11/21 1123 12/12/21 0254  AST 34 26  --  29  ALT 31 22  --  25  ALKPHOS 97 87  --  97  BILITOT 1.9* 1.6* 1.9* 1.9*  PROT 6.2* 5.2*  --  5.9*  ALBUMIN 2.5* 2.1*  --  2.3*   Recent Labs  Lab 12/10/21 1219  LIPASE 36   No results for input(s): "AMMONIA" in the last 168 hours. Coagulation Profile: Recent Labs  Lab 12/11/21 0500 12/11/21 1123  INR 1.4* 1.4*   Cardiac Enzymes: Recent Labs  Lab 12/10/21 1416  CKTOTAL 25*   BNP (last 3 results) No results for input(s): "PROBNP" in the last 8760 hours. HbA1C: No results for input(s): "HGBA1C" in the last 72 hours. CBG: No results for input(s): "GLUCAP" in the last 168 hours. Lipid Profile: No results for input(s): "CHOL", "HDL", "LDLCALC", "TRIG", "CHOLHDL", "LDLDIRECT" in the last 72 hours. Thyroid Function  Tests: Recent Labs    12/11/21 0500  TSH 1.963   Anemia Panel: Recent Labs    12/11/21 0500 12/11/21 1123 12/12/21 0254  VITAMINB12 348  --   --   FOLATE 21.4  --   --   FERRITIN  --   --  986*  TIBC  --   --  252  IRON  --   --  40*  RETICCTPCT  --  6.5* 6.7*   Sepsis Labs: Recent Labs  Lab 12/10/21 1226 12/10/21 1416 12/10/21 2330 12/11/21 0500 12/12/21 0254  PROCALCITON  --   --   --  0.20 0.16  LATICACIDVEN 5.0* 3.7* 1.8  --   --     Recent Results (from the past 240 hour(s))  Resp Panel by RT-PCR (Flu A&B, Covid) Anterior Nasal Swab  Status: None   Collection Time: 12/10/21 12:16 PM   Specimen: Anterior Nasal Swab  Result Value Ref Range Status   SARS Coronavirus 2 by RT PCR NEGATIVE NEGATIVE Final    Comment: (NOTE) SARS-CoV-2 target nucleic acids are NOT DETECTED.  The SARS-CoV-2 RNA is generally detectable in upper respiratory specimens during the acute phase of infection. The lowest concentration of SARS-CoV-2 viral copies this assay can detect is 138 copies/mL. A negative result does not preclude SARS-Cov-2 infection and should not be used as the sole basis for treatment or other patient management decisions. A negative result may occur with  improper specimen collection/handling, submission of specimen other than nasopharyngeal swab, presence of viral mutation(s) within the areas targeted by this assay, and inadequate number of viral copies(<138 copies/mL). A negative result must be combined with clinical observations, patient history, and epidemiological information. The expected result is Negative.  Fact Sheet for Patients:  EntrepreneurPulse.com.au  Fact Sheet for Healthcare Providers:  IncredibleEmployment.be  This test is no t yet approved or cleared by the [place] FDA and  has been authorized for detection and/or diagnosis of SARS-CoV-2 by FDA under an Emergency Use Authorization (EUA). This EUA  will remain  in effect (meaning this test can be used) for the duration of the COVID-19 declaration under Section 564(b)(1) of the Act, 21 U.S.C.section 360bbb-3(b)(1), unless the authorization is terminated  or revoked sooner.       Influenza A by PCR NEGATIVE NEGATIVE Final   Influenza B by PCR NEGATIVE NEGATIVE Final    Comment: (NOTE) The Xpert Xpress SARS-CoV-2/FLU/RSV plus assay is intended as an aid in the diagnosis of influenza from Nasopharyngeal swab specimens and should not be used as a sole basis for treatment. Nasal washings and aspirates are unacceptable for Xpert Xpress SARS-CoV-2/FLU/RSV testing.  Fact Sheet for Patients: EntrepreneurPulse.com.au  Fact Sheet for Healthcare Providers: IncredibleEmployment.be  This test is not yet approved or cleared by the [place] FDA and has been authorized for detection and/or diagnosis of SARS-CoV-2 by FDA under an Emergency Use Authorization (EUA). This EUA will remain in effect (meaning this test can be used) for the duration of the COVID-19 declaration under Section 564(b)(1) of the Act, 21 U.S.C. section 360bbb-3(b)(1), unless the authorization is terminated or revoked.  Performed at [facility], [place] [address], [place], [place] [address]   Urine Culture     Status: None   Collection Time: 12/11/21  6:55 AM   Specimen: Urine, Clean Catch  Result Value Ref Range Status   Specimen Description URINE, CLEAN CATCH  Final   Special Requests NONE  Final   Culture   Final    NO GROWTH Performed at [facility], [place] [address] [place], [place] [address]    Report Status 12/12/2021 FINAL  Final  Culture, blood (Routine X 2) w Reflex to ID Panel     Status: None (Preliminary result)   Collection Time: 12/11/21  6:55 AM   Specimen: BLOOD RIGHT HAND  Result Value Ref Range Status   Specimen Description BLOOD RIGHT HAND  Final   Special Requests   Final    BOTTLES DRAWN  AEROBIC AND ANAEROBIC Blood Culture adequate volume   Culture   Final    NO GROWTH < 24 HOURS Performed at [facility], [place] [address], [place], [place] [address]    Report Status PENDING  Incomplete  Culture, blood (Routine X 2) w Reflex to ID Panel  Status: None (Preliminary result)   Collection Time: 12/11/21  6:02 PM   Specimen: BLOOD  Result Value Ref Range Status   Specimen Description BLOOD LEFT ANTECUBITAL  Final   Special Requests   Final    BOTTLES DRAWN AEROBIC AND ANAEROBIC Blood Culture adequate volume   Culture   Final    NO GROWTH < 24 HOURS Performed at [facility], [address], [place], [place] [address]    Report Status PENDING  Incomplete         Radiology Studies: MR LUMBAR SPINE WO CONTRAST  Result Date: 12/11/2021 CLINICAL DATA:  Low back pain EXAM: MRI LUMBAR SPINE WITHOUT CONTRAST TECHNIQUE: Multiplanar, multisequence MR imaging of the lumbar spine was performed. No intravenous contrast was administered. COMPARISON:  No prior MRI of the lumbar spine, correlation is made with 11/18/2021 lumbar spine radiographs and 12/10/2021 CT abdomen pelvis FINDINGS: Segmentation:  5 lumbar-type vertebral bodies. Alignment:  Mild levocurvature.  No listhesis. Vertebrae: No acute fracture or suspicious osseous lesion. Diffusely decreased marrow signal, which on T1 is slightly hyperintense to skeletal muscle, likely red marrow reconversion. Speckled marrow signal in the bilateral iliac bones and, to a lesser extent, vertebral bodies, which is favored to be benign given its appearance on the 12/10/2021 CT. Conus medullaris and cauda equina: Conus extends to the T12-L1 level. Conus and cauda equina appear normal. Paraspinal and other soft tissues: Right renal cyst, better evaluated on the prior CT, for which no follow-up is currently indicated. Disc levels: T12-L1: Disc desiccation without significant disc bulge. No spinal canal stenosis or neural foraminal  narrowing. L1-L2: No significant disc bulge. No spinal canal stenosis or neural foraminal narrowing. L2-L3: Disc desiccation and minimal disc bulge with central annular fissure. Mild facet arthropathy. No spinal canal stenosis or neural foraminal narrowing. L3-L4: Disc desiccation and minimal disc bulge with left foraminal protrusion. Mild facet arthropathy. No spinal canal stenosis or neural foraminal narrowing. L4-L5: Disc desiccation and minimal disc bulge. Mild facet arthropathy. Epidural lipomatosis causes mild thecal sac narrowing at the disc level and moderate to severe thecal sac narrowing posterior to L5. No neural foraminal narrowing. L5-S1: No significant disc bulge. Mild facet arthropathy. Epidural lipomatosis, which causes severe thecal sac narrowing. No neural foraminal narrowing. IMPRESSION: 1. 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 No neural foraminal narrowing. 2. Diffusely decreased marrow signal, slightly hyperintense to skeletal muscle, likely red marrow reconversion. Additional speckled marrow signal in the bilateral iliac bones and, to a lesser extent, vertebral bodies, is favored to be benign given its appearance on the 12/10/2021 CT. A diffuse infiltrative bone marrow process is felt to be less likely. Consider correlation with serum labs. Electronically Signed   By: Merilyn Baba M.D.   On: 12/11/2021 13:07   CT ABDOMEN PELVIS W CONTRAST  Result Date: 12/10/2021 CLINICAL DATA:  LEFT lower quadrant abdominal pain.  Sepsis EXAM: CT ABDOMEN AND PELVIS WITH CONTRAST TECHNIQUE: Multidetector CT imaging of the abdomen and pelvis was performed using the standard protocol following bolus administration of intravenous contrast. RADIATION DOSE REDUCTION: This exam was performed according to the departmental  dose-optimization program which includes automated exposure control, adjustment of the mA and/or kV according to patient size and/or use of iterative reconstruction technique. CONTRAST:  39m OMNIPAQUE IOHEXOL 350 MG/ML SOLN COMPARISON:  None Available.  FINDINGS: Lower chest: Lung bases are clear. Hepatobiliary: No focal hepatic lesion. No biliary duct dilatation. Common bile duct is normal. Pancreas: Pancreas is normal. No ductal dilatation. No pancreatic inflammation. Spleen: Normal spleen Adrenals/urinary tract: Adrenal glands normal. Bilateral simple fluid attenuation renal cysts. No follow-up recommended for Bosniak 1 renal cysts. Ureters and bladder normal. Stomach/Bowel: Stomach, small bowel, appendix, and cecum are normal. The colon and rectosigmoid colon are normal. Vascular/Lymphatic: Abdominal aorta is normal caliber. No periportal or retroperitoneal adenopathy. No pelvic adenopathy. Reproductive: Post prostatectomy Other: Prior LEFT inguinal hernia repair.  No evidence recurrence. Musculoskeletal: No aggressive osseous lesion. IMPRESSION: 1. No acute findings abdomen pelvis. 2. Prior LEFT inguinal hernia repair without complication. 3. Bilateral benign renal cysts.  No obstructive uropathy Electronically Signed   By: Suzy Bouchard M.D.   On: 12/10/2021 16:15   DG Chest 2 View  Result Date: 12/10/2021 CLINICAL DATA:  Chest pain. EXAM: CHEST - 2 VIEW COMPARISON:  Chest x-ray 09/07/2019. FINDINGS: The heart size and mediastinal contours are within normal limits. Both lungs are clear. No visible pleural effusions or pneumothorax. No acute osseous abnormality. IMPRESSION: No active cardiopulmonary disease. Electronically Signed   By: Margaretha Sheffield M.D.   On: 12/10/2021 12:44        Scheduled Meds:  folic acid  1 mg Oral Daily   multivitamin with minerals  1 tablet Oral Daily   pantoprazole (PROTONIX) IV  40 mg Intravenous Q12H   thiamine  100 mg Oral Daily   Or   thiamine  100 mg  Intravenous Daily   Continuous Infusions:  ceFEPime (MAXIPIME) IV 2 g (12/12/21 0500)   vancomycin Stopped (12/11/21 1622)          Aline August, MD [facility] 12/12/2021, 11:29 AM

## 2021-12-12 NOTE — Plan of Care (Signed)
  Problem: Fluid Volume: Goal: Hemodynamic stability will improve Outcome: Progressing   Problem: Clinical Measurements: Goal: Diagnostic test results will improve Outcome: Progressing Goal: Signs and symptoms of infection will decrease Outcome: Progressing   Problem: Respiratory: Goal: Ability to maintain adequate ventilation will improve Outcome: Progressing   Problem: Urinary Elimination: Goal: Signs and symptoms of infection will decrease Outcome: Progressing   Problem: Education: Goal: Knowledge of General Education information will improve Description: Including pain rating scale, medication(s)/side effects and non-pharmacologic comfort measures Outcome: Progressing   Problem: Education: Goal: Knowledge of General Education information will improve Description: Including pain rating scale, medication(s)/side effects and non-pharmacologic comfort measures Outcome: Progressing   Problem: Health Behavior/Discharge Planning: Goal: Ability to manage health-related needs will improve Outcome: Progressing   Problem: Activity: Goal: Risk for activity intolerance will decrease Outcome: Progressing   Problem: Nutrition: Goal: Adequate nutrition will be maintained Outcome: Progressing   Problem: Coping: Goal: Level of anxiety will decrease Outcome: Progressing   Problem: Pain Managment: Goal: General experience of comfort will improve Outcome: Progressing   Problem: Safety: Goal: Ability to remain free from injury will improve Outcome: Progressing

## 2021-12-13 ENCOUNTER — Encounter (HOSPITAL_COMMUNITY): Payer: Self-pay | Admitting: Internal Medicine

## 2021-12-13 DIAGNOSIS — E871 Hypo-osmolality and hyponatremia: Secondary | ICD-10-CM | POA: Diagnosis not present

## 2021-12-13 DIAGNOSIS — A419 Sepsis, unspecified organism: Secondary | ICD-10-CM | POA: Diagnosis not present

## 2021-12-13 DIAGNOSIS — D649 Anemia, unspecified: Secondary | ICD-10-CM | POA: Diagnosis not present

## 2021-12-13 DIAGNOSIS — N390 Urinary tract infection, site not specified: Secondary | ICD-10-CM | POA: Diagnosis not present

## 2021-12-13 LAB — CBC WITH DIFFERENTIAL/PLATELET
Abs Immature Granulocytes: 0.01 10*3/uL (ref 0.00–0.07)
Basophils Absolute: 0 10*3/uL (ref 0.0–0.1)
Basophils Relative: 0 %
Eosinophils Absolute: 0 10*3/uL (ref 0.0–0.5)
Eosinophils Relative: 0 %
HCT: 22.4 % — ABNORMAL LOW (ref 39.0–52.0)
Hemoglobin: 7.3 g/dL — ABNORMAL LOW (ref 13.0–17.0)
Immature Granulocytes: 0 %
Lymphocytes Relative: 32 %
Lymphs Abs: 0.8 10*3/uL (ref 0.7–4.0)
MCH: 28.7 pg (ref 26.0–34.0)
MCHC: 32.6 g/dL (ref 30.0–36.0)
MCV: 88.2 fL (ref 80.0–100.0)
Monocytes Absolute: 0.3 10*3/uL (ref 0.1–1.0)
Monocytes Relative: 10 %
Neutro Abs: 1.5 10*3/uL — ABNORMAL LOW (ref 1.7–7.7)
Neutrophils Relative %: 58 %
Platelets: 43 10*3/uL — ABNORMAL LOW (ref 150–400)
RBC: 2.54 MIL/uL — ABNORMAL LOW (ref 4.22–5.81)
RDW: 22.3 % — ABNORMAL HIGH (ref 11.5–15.5)
WBC: 2.6 10*3/uL — ABNORMAL LOW (ref 4.0–10.5)
nRBC: 6.6 % — ABNORMAL HIGH (ref 0.0–0.2)

## 2021-12-13 LAB — COMPREHENSIVE METABOLIC PANEL
ALT: 22 U/L (ref 0–44)
AST: 30 U/L (ref 15–41)
Albumin: 2 g/dL — ABNORMAL LOW (ref 3.5–5.0)
Alkaline Phosphatase: 107 U/L (ref 38–126)
Anion gap: 9 (ref 5–15)
BUN: 13 mg/dL (ref 8–23)
CO2: 20 mmol/L — ABNORMAL LOW (ref 22–32)
Calcium: 8 mg/dL — ABNORMAL LOW (ref 8.9–10.3)
Chloride: 106 mmol/L (ref 98–111)
Creatinine, Ser: 1.16 mg/dL (ref 0.61–1.24)
GFR, Estimated: >60 mL/min (ref >60)
Glucose, Bld: 150 mg/dL — ABNORMAL HIGH (ref 70–99)
Potassium: 4.2 mmol/L (ref 3.5–5.1)
Sodium: 135 mmol/L (ref 135–145)
Total Bilirubin: 1.2 mg/dL (ref 0.3–1.2)
Total Protein: 5.2 g/dL — ABNORMAL LOW (ref 6.5–8.1)

## 2021-12-13 LAB — MAGNESIUM: Magnesium: 1.9 mg/dL (ref 1.7–2.4)

## 2021-12-13 LAB — PROCALCITONIN: Procalcitonin: 0.24 ng/mL

## 2021-12-13 LAB — HGB FRACTIONATION CASCADE
Hgb A2: 2.4 % (ref 1.8–3.2)
Hgb A: 97.6 % (ref 96.4–98.8)
Hgb F: 0 % (ref 0.0–2.0)
Hgb S: 0 %

## 2021-12-13 MED ORDER — SENNOSIDES-DOCUSATE SODIUM 8.6-50 MG PO TABS
1.0000 | ORAL_TABLET | Freq: Two times a day (BID) | ORAL | Status: DC
  Administered 2021-12-13 – 2021-12-20 (×13): 1 via ORAL
  Filled 2021-12-13 (×14): qty 1

## 2021-12-13 MED ORDER — POLYETHYLENE GLYCOL 3350 17 G PO PACK
17.0000 g | PACK | Freq: Every day | ORAL | Status: DC | PRN
  Administered 2021-12-19: 17 g via ORAL
  Filled 2021-12-13 (×2): qty 1

## 2021-12-13 MED ORDER — B COMPLEX-C PO TABS
1.0000 | ORAL_TABLET | Freq: Every day | ORAL | Status: DC
  Administered 2021-12-13 – 2021-12-20 (×8): 1 via ORAL
  Filled 2021-12-13 (×8): qty 1

## 2021-12-13 NOTE — Progress Notes (Signed)
PROGRESS NOTE    Tyler [name]  UDJ:[id number] DOB: 1950-09-17 DOA: 12/10/2021 PCP: [name], FNP   Brief Narrative:  71 y.o. male with a past medical history of prostate cancer status post prostatectomy in 2021, history of bilateral pulmonary embolism after his surgery in 2021, essential hypertension presented with worsening back pain radiating down to right leg along with fevers and hematuria.  On presentation, his hemoglobin was 6.3.  He was started on broad-spectrum antibiotics for possible UTI.  Subsequently, oncology was consulted for pancytopenia and neurosurgery was consulted for lumbosacral stenosis and severe lower back pain.  Assessment & Plan:   Sepsis: Present on admission Possible UTI: Present on admission -Presented with positive nitrites in UA but no pyuria and only a few bacteria.  Blood and urine cultures negative so far -Currently on cefepime.  Vancomycin discontinued on 12/12/2021.  No temperature spikes over the last 24 hours. -Has significantly elevated CRP and has pancytopenia.  Unclear if the fever is from an underlying hematological process -CT of abdomen and pelvis was negative for any acute findings. -MRI of lumbar spine did not show signs of osteomyelitis  Pancytopenia with symptomatic anemia -Status post 2 units packed red cell transfusion during this hospitalization.  Hemoglobin 7.3 this morning.  Monitor H&H.  No overt signs of bleeding. -Oncology has been consulted.  Patient might need bone marrow biopsy if problem persists as per oncology. -Normal vitamin B12 and folate levels. -No overt signs of bleeding.  Platelets 43 today.  Abdominal pain -Lipase normal.  LFTs almost unremarkable.  CT of abdomen and pelvis did not show any acute findings.  Continue PPI for now.  Abdominal pain improving.  Severe lower back pain radiating to her right lower extremity Lumbosacral stenosis -Still having intermittent significant lower back pain radiating to  right lower extremity.  MRI of lumbar spine showed moderate to severe thecal sac narrowing posterior to L5 and severe thecal sac narrowing of L5-S1.   -Neurosurgery evaluation appreciated: Patient is not an operative candidate at this time.  Neurosurgery does not believe that patient has severe lumbosacral stenosis and it is unlikely that epidural lipomatosis is responsible for such severe pain.  History of bilateral PE in 2021 -He was on Eliquis up until 2 to 3 months ago when it was discontinued by his urologist.  It was a provoked PE after his prostatectomy in 2021.  Alcohol abuse -Heavy consumption in the last 2 to 3 months per patient's daughter.  No evidence of withdrawal currently.  Continue CIWA protocol.  Continue thiamine, multivitamins and folic acid.  History of prostate cancer status post prostatectomy in 2021 -Followed by Dr. [name] with urology.  PSA was undetectable earlier this month.  Results available in care everywhere.  Obesity -Outpatient follow-up  DVT prophylaxis: SCDs Code Status: Full Family Communication: Wife at bedside Disposition Plan: Status is: Inpatient Remains inpatient appropriate because: Of severity of illness.  Consultants: Oncology  Procedures: None  Antimicrobials: Cefepime from 12/10/2021 onwards.  Vancomycin 12/10/2021-12/11/2021   Subjective: Patient seen and examined at bedside.  Complains of intermittent lower back pain but improving.  No fever, vomiting, abdominal pain reported. Objective: Vitals:   12/12/21 1009 12/12/21 1705 12/12/21 2230 12/13/21 0527  BP: 108/68 129/82 122/65 135/87  Pulse: 89 88 100 79  Resp:  _0 Temp: 98.1 F (36.7 C) 98.4 F (36.9 C) 98 F (36.7 C) 98.2 F (36.8 C)  TempSrc: Oral Oral Oral Oral  SpO2: 100% 100% 100% 100%  Weight:      Height:        Intake/Output Summary (Last 24 hours) at 12/13/2021 0752 Last data filed at 12/13/2021 0600 Gross per 24 hour  Intake 1255.87 ml  Output 0 ml   Net 1255.87 ml    Filed Weights   12/10/21 1424  Weight: 88.5 kg    Examination:  General: On room air.  No distress.  Looks chronically ill and deconditioned. ENT/neck: No thyromegaly.  JVD is not elevated  respiratory: Decreased breath sounds at bases bilaterally with some crackles; no wheezing  CVS: S1-S2 heard, rate controlled currently Abdominal: Soft, nontender, slightly distended; no organomegaly, bowel sounds are heard Extremities: Trace lower extremity edema; no cyanosis  CNS: Awake and alert.  No focal neurologic deficit.  Moves extremities Lymph: No obvious lymphadenopathy Skin: No obvious ecchymosis/lesions  psych: Currently not agitated.  Affect is mostly flat. musculoskeletal: No obvious joint swelling/deformity    Data Reviewed: I have personally reviewed following labs and imaging studies  CBC: Recent Labs  Lab 12/10/21 1219 12/10/21 2331 12/11/21 0500 12/11/21 1123 12/12/21 0254 12/13/21 0209  WBC 3.8* 3.1* 2.8*  --  3.1* 2.6*  NEUTROABS  --  1.7 1.8  --  1.8 1.5*  HGB 6.2* 6.3* 7.4*  --  8.4* 7.3*  HCT 20.2* 20.6* 22.9*  --  26.0* 22.4*  MCV 90.2 92.4 87.4  --  87.8 88.2  PLT 78* 66* 63* 66* 53* 43*    Basic Metabolic Panel: Recent Labs  Lab 12/10/21 1219 12/10/21 2331 12/11/21 0500 12/12/21 0254 12/13/21 0209  NA 133*  --  136 137 135  K 3.9  --  4.4 4.3 4.2  CL 100  --  106 105 106  CO2 20*  --  21* 21* 20*  GLUCOSE 148*  --  103* 99 150*  BUN 13  --  _0 CREATININE 1.14  --  0.97 1.03 1.16  CALCIUM 8.4*  --  8.0* 8.8* 8.0*  MG  --  1.9 1.7  --  1.9    GFR: Estimated Creatinine Clearance: 63.8 mL/min (by C-G formula based on SCr of 1.16 mg/dL). Liver Function Tests: Recent Labs  Lab 12/10/21 1219 12/11/21 0500 12/11/21 1123 12/12/21 0254 12/13/21 0209  AST 34 26  --  29 30  ALT 31 22  --  25 22  ALKPHOS 97 87  --  97 107  BILITOT 1.9* 1.6* 1.9* 1.9* 1.2  PROT 6.2* 5.2*  --  5.9* 5.2*  ALBUMIN 2.5* 2.1*  --  2.3*  2.0*    Recent Labs  Lab 12/10/21 1219  LIPASE 36    No results for input(s): "AMMONIA" in the last 168 hours. Coagulation Profile: Recent Labs  Lab 12/11/21 0500 12/11/21 1123  INR 1.4* 1.4*    Cardiac Enzymes: Recent Labs  Lab 12/10/21 1416  CKTOTAL 25*    BNP (last 3 results) No results for input(s): "PROBNP" in the last 8760 hours. HbA1C: No results for input(s): "HGBA1C" in the last 72 hours. CBG: No results for input(s): "GLUCAP" in the last 168 hours. Lipid Profile: No results for input(s): "CHOL", "HDL", "LDLCALC", "TRIG", "CHOLHDL", "LDLDIRECT" in the last 72 hours. Thyroid Function Tests: Recent Labs    12/11/21 0500  TSH 1.963    Anemia Panel: Recent Labs    12/11/21 0500 12/11/21 1123 12/12/21 0254  VITAMINB12 348  --   --   FOLATE 21.4  --   --   FERRITIN  --   --  986*  TIBC  --   --  252  IRON  --   --  40*  RETICCTPCT  --  6.5* 6.7*    Sepsis Labs: Recent Labs  Lab 12/10/21 1226 12/10/21 1416 12/10/21 2330 12/11/21 0500 12/12/21 0254 12/13/21 0209  PROCALCITON  --   --   --  0.20 0.16 0.24  LATICACIDVEN 5.0* 3.7* 1.8  --   --   --      Recent Results (from the past 240 hour(s))  Resp Panel by RT-PCR (Flu A&B, Covid) Anterior Nasal Swab     Status: None   Collection Time: 12/10/21 12:16 PM   Specimen: Anterior Nasal Swab  Result Value Ref Range Status   SARS Coronavirus 2 by RT PCR NEGATIVE NEGATIVE Final    Comment: (NOTE) SARS-CoV-2 target nucleic acids are NOT DETECTED.  The SARS-CoV-2 RNA is generally detectable in upper respiratory specimens during the acute phase of infection. The lowest concentration of SARS-CoV-2 viral copies this assay can detect is 138 copies/mL. A negative result does not preclude SARS-Cov-2 infection and should not be used as the sole basis for treatment or other patient management decisions. A negative result may occur with  improper specimen collection/handling, submission of specimen  other than nasopharyngeal swab, presence of viral mutation(s) within the areas targeted by this assay, and inadequate number of viral copies(<138 copies/mL). A negative result must be combined with clinical observations, patient history, and epidemiological information. The expected result is Negative.  Fact Sheet for Patients:  EntrepreneurPulse.com.au  Fact Sheet for Healthcare Providers:  IncredibleEmployment.be  This test is no t yet approved or cleared by the [place] FDA and  has been authorized for detection and/or diagnosis of SARS-CoV-2 by FDA under an Emergency Use Authorization (EUA). This EUA will remain  in effect (meaning this test can be used) for the duration of the COVID-19 declaration under Section 564(b)(1) of the Act, 21 U.S.C.section 360bbb-3(b)(1), unless the authorization is terminated  or revoked sooner.       Influenza A by PCR NEGATIVE NEGATIVE Final   Influenza B by PCR NEGATIVE NEGATIVE Final    Comment: (NOTE) The Xpert Xpress SARS-CoV-2/FLU/RSV plus assay is intended as an aid in the diagnosis of influenza from Nasopharyngeal swab specimens and should not be used as a sole basis for treatment. Nasal washings and aspirates are unacceptable for Xpert Xpress SARS-CoV-2/FLU/RSV testing.  Fact Sheet for Patients: EntrepreneurPulse.com.au  Fact Sheet for Healthcare Providers: IncredibleEmployment.be  This test is not yet approved or cleared by the [place] FDA and has been authorized for detection and/or diagnosis of SARS-CoV-2 by FDA under an Emergency Use Authorization (EUA). This EUA will remain in effect (meaning this test can be used) for the duration of the COVID-19 declaration under Section 564(b)(1) of the Act, 21 U.S.C. section 360bbb-3(b)(1), unless the authorization is terminated or revoked.  Performed at [facility], [place] [address], [place],  [place] [address]   Urine Culture     Status: None   Collection Time: 12/11/21  6:55 AM   Specimen: Urine, Clean Catch  Result Value Ref Range Status   Specimen Description URINE, CLEAN CATCH  Final   Special Requests NONE  Final   Culture   Final    NO GROWTH Performed at [facility], [place] [address], [place],  [address]    Report Status 12/12/2021 FINAL  Final  Culture, blood (Routine X 2) w Reflex to ID Panel  Status: None (Preliminary result)   Collection Time: 12/11/21  6:55 AM   Specimen: BLOOD RIGHT HAND  Result Value Ref Range Status   Specimen Description BLOOD RIGHT HAND  Final   Special Requests   Final    BOTTLES DRAWN AEROBIC AND ANAEROBIC Blood Culture adequate volume   Culture   Final    NO GROWTH < 24 HOURS Performed at [facility], [address], [place], [place] [address]    Report Status PENDING  Incomplete  Culture, blood (Routine X 2) w Reflex to ID Panel     Status: None (Preliminary result)   Collection Time: 12/11/21  6:02 PM   Specimen: BLOOD  Result Value Ref Range Status   Specimen Description BLOOD LEFT ANTECUBITAL  Final   Special Requests   Final    BOTTLES DRAWN AEROBIC AND ANAEROBIC Blood Culture adequate volume   Culture   Final    NO GROWTH < 24 HOURS Performed at [facility], [place] [address], [place], [place] [address]    Report Status PENDING  Incomplete         Radiology Studies: MR LUMBAR SPINE WO CONTRAST  Result Date: 12/11/2021 CLINICAL DATA:  Low back pain EXAM: MRI LUMBAR SPINE WITHOUT CONTRAST TECHNIQUE: Multiplanar, multisequence MR imaging of the lumbar spine was performed. No intravenous contrast was administered. COMPARISON:  No prior MRI of the lumbar spine, correlation is made with 11/18/2021 lumbar spine radiographs and 12/10/2021 CT abdomen pelvis FINDINGS: Segmentation:  5 lumbar-type vertebral bodies. Alignment:  Mild levocurvature.  No listhesis. Vertebrae: No acute fracture or suspicious  osseous lesion. Diffusely decreased marrow signal, which on T1 is slightly hyperintense to skeletal muscle, likely red marrow reconversion. Speckled marrow signal in the bilateral iliac bones and, to a lesser extent, vertebral bodies, which is favored to be benign given its appearance on the 12/10/2021 CT. Conus medullaris and cauda equina: Conus extends to the T12-L1 level. Conus and cauda equina appear normal. Paraspinal and other soft tissues: Right renal cyst, better evaluated on the prior CT, for which no follow-up is currently indicated. Disc levels: T12-L1: Disc desiccation without significant disc bulge. No spinal canal stenosis or neural foraminal narrowing. L1-L2: No significant disc bulge. No spinal canal stenosis or neural foraminal narrowing. L2-L3: Disc desiccation and minimal disc bulge with central annular fissure. Mild facet arthropathy. No spinal canal stenosis or neural foraminal narrowing. L3-L4: Disc desiccation and minimal disc bulge with left foraminal protrusion. Mild facet arthropathy. No spinal canal stenosis or neural foraminal narrowing. L4-L5: Disc desiccation and minimal disc bulge. Mild facet arthropathy. Epidural lipomatosis causes mild thecal sac narrowing at the disc level and moderate to severe thecal sac narrowing posterior to L5. No neural foraminal narrowing. L5-S1: No significant disc bulge. Mild facet arthropathy. Epidural lipomatosis, which causes severe thecal sac narrowing. No neural foraminal narrowing. IMPRESSION: 1. 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 No neural foraminal narrowing. 2. Diffusely decreased marrow signal, slightly hyperintense to skeletal muscle, likely red marrow reconversion. Additional speckled marrow signal in the bilateral iliac bones and, to a lesser extent, vertebral  bodies, is favored to be benign given its appearance on the 12/10/2021 CT. A diffuse infiltrative bone marrow process is felt to be less likely. Consider correlation with serum labs. Electronically Signed   By: Bryson Ha  Vasan M.D.   On: 12/11/2021 13:07        Scheduled Meds:  feeding supplement  237 mL Oral BID BM   folic acid  1 mg Oral Daily   multivitamin with minerals  1 tablet Oral Daily   pantoprazole (PROTONIX) IV  40 mg Intravenous Q12H   thiamine  100 mg Oral Daily   Continuous Infusions:  ceFEPime (MAXIPIME) IV 2 g (12/13/21 6553)          Aline August, MD [facility] 12/13/2021, 7:52 AM

## 2021-12-13 NOTE — Consult Note (Signed)
Chief Complaint: Patient was seen in consultation today for worsening pancytopenia  Chief Complaint  Patient presents with   Chest Pain   at the request of Sullivan Lone   Referring Physician(s): [place], [place] Physician: Corrie Mckusick  Patient Status: [facility] - In-pt  History of Present Illness: Jayden Kratochvil is a 71 y.o. male with PMHs of HTN, prostate CA s/p prostatectomy in 2021, bilateral PE in 2021, currently admitted due to sepsis, worsening of pancytopenia who presents for bone marrow biopsy.   Patient presented to ED on 10/16 due to fever, hematuria, anemia, and worsening back pain, he was found to be in sepsis, started on abx and admitted for further eval and management. Patient received 2 PRBC during this admission. Urine and blood culture NGTD.   Hematology was consulted for eval and management of pancytopenia. The etiology of pancytopenia is mostly likely multifactorial with sepsis/NSAID use/ UTI/ETOH abuse per hematology, however, due to progressive pancytoipenia and elevated LDH levels, bone marrow biopsy and aspiration was recommended to the patient which he decided to proceed.   IR was requested for image guided BMBx.   Patient assessed at bedside alongside family.  Daughter provides interpretation per his request.   Past Medical History:  Diagnosis Date   Cancer [facility])    prostate   Hypertension     Past Surgical History:  Procedure Laterality Date   LYMPHADENECTOMY Bilateral 09/01/2019   Procedure: LYMPHADENECTOMY;  Surgeon: Alexis Frock, MD;  Location: [facility];  Service: Urology;  Laterality: Bilateral;   ROBOT ASSISTED LAPAROSCOPIC RADICAL PROSTATECTOMY N/A 09/01/2019   Procedure: XI ROBOTIC ASSISTED LAPAROSCOPIC RADICAL PROSTATECTOMY;  Surgeon: Alexis Frock, MD;  Location: [facility];  Service: Urology;  Laterality: N/A;  3 HRS    Allergies: Amlodipine  Medications: Prior to Admission medications   Medication Sig Start Date End Date  Taking? Authorizing Provider  baclofen (LIORESAL) 10 MG tablet Take 10 mg by mouth 3 (three) times daily. 11/22/21  Yes [provider]  ibuprofen (ADVIL) 200 MG tablet Take 800 mg by mouth every 6 (six) hours as needed for mild pain.   Yes [provider]  lisinopril (ZESTRIL) 5 MG tablet Take 5 mg by mouth daily.   Yes [provider]  Menthol-Methyl Salicylate (MUSCLE RUB) 10-15 % CREA Apply 1 Application topically daily as needed for muscle pain.   Yes [provider]  ondansetron (ZOFRAN-ODT) 4 MG disintegrating tablet 66m ODT q4 hours prn nausea/vomit 11/29/21  Yes FDeno Etienne DO     Family History  Problem Relation Age of Onset   Stroke Father     Social History   Socioeconomic History   Marital status: Married    Spouse name: Not on file   Number of children: Not on file   Years of education: Not on file   Highest education level: Not on file  Occupational History   Not on file  Tobacco Use   Smoking status: Never   Smokeless tobacco: Never  Vaping Use   Vaping Use: Never used  Substance and Sexual Activity   Alcohol use: Yes    Comment: at least one 40 oz beer daily   Drug use: Never   Sexual activity: Not on file  Other Topics Concern   Not on file  Social History Narrative   Not on file   Social Determinants of Health   Financial Resource Strain: Not on file  Food Insecurity: Not on file  Transportation Needs: Not on file  Physical Activity: Not on file  Stress: Not on file  Social Connections: Not on file     Review of Systems: A 12 point ROS discussed and pertinent positives are indicated in the HPI above.  All other systems are negative.  Vital Signs: BP 138/83   Pulse 88   Temp 98.7 F (37.1 C) (Oral)   Resp 14   Ht _0  (1.702 m)   Wt 195 lb 1.7 oz (88.5 kg)   SpO2 100%   BMI 30.56 kg/m   Physical Exam Vitals and nursing note reviewed.  Constitutional:      Appearance: He is well-developed.   Cardiovascular:     Rate and Rhythm: Normal rate and regular rhythm.  Pulmonary:     Effort: Pulmonary effort is normal.     Breath sounds: Normal breath sounds.  Skin:    General: Skin is warm and dry.  Neurological:     General: No focal deficit present.     Mental Status: He is alert and oriented to person, place, and time.  Psychiatric:        Mood and Affect: Mood normal.        Behavior: Behavior normal.     MD Evaluation Airway: WNL Heart: WNL Abdomen: WNL Chest/ Lungs: WNL ASA  Classification: 3 Mallampati/Airway Score: Two  Imaging: MR LUMBAR SPINE WO CONTRAST  Result Date: 12/11/2021 CLINICAL DATA:  Low back pain EXAM: MRI LUMBAR SPINE WITHOUT CONTRAST TECHNIQUE: Multiplanar, multisequence MR imaging of the lumbar spine was performed. No intravenous contrast was administered. COMPARISON:  No prior MRI of the lumbar spine, correlation is made with 11/18/2021 lumbar spine radiographs and 12/10/2021 CT abdomen pelvis FINDINGS: Segmentation:  5 lumbar-type vertebral bodies. Alignment:  Mild levocurvature.  No listhesis. Vertebrae: No acute fracture or suspicious osseous lesion. Diffusely decreased marrow signal, which on T1 is slightly hyperintense to skeletal muscle, likely red marrow reconversion. Speckled marrow signal in the bilateral iliac bones and, to a lesser extent, vertebral bodies, which is favored to be benign given its appearance on the 12/10/2021 CT. Conus medullaris and cauda equina: Conus extends to the T12-L1 level. Conus and cauda equina appear normal. Paraspinal and other soft tissues: Right renal cyst, better evaluated on the prior CT, for which no follow-up is currently indicated. Disc levels: T12-L1: Disc desiccation without significant disc bulge. No spinal canal stenosis or neural foraminal narrowing. L1-L2: No significant disc bulge. No spinal canal stenosis or neural foraminal narrowing. L2-L3: Disc desiccation and minimal disc bulge with central annular  fissure. Mild facet arthropathy. No spinal canal stenosis or neural foraminal narrowing. L3-L4: Disc desiccation and minimal disc bulge with left foraminal protrusion. Mild facet arthropathy. No spinal canal stenosis or neural foraminal narrowing. L4-L5: Disc desiccation and minimal disc bulge. Mild facet arthropathy. Epidural lipomatosis causes mild thecal sac narrowing at the disc level and moderate to severe thecal sac narrowing posterior to L5. No neural foraminal narrowing. L5-S1: No significant disc bulge. Mild facet arthropathy. Epidural lipomatosis, which causes severe thecal sac narrowing. No neural foraminal narrowing. IMPRESSION: 1. 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 No neural foraminal narrowing. 2. Diffusely decreased marrow signal, slightly hyperintense to skeletal muscle, likely red marrow reconversion. Additional speckled marrow signal in the bilateral iliac bones and, to a lesser extent, vertebral bodies, is favored to be  benign given its appearance on the 12/10/2021 CT. A diffuse infiltrative bone marrow process is felt to be less likely. Consider correlation with serum labs. Electronically Signed   By: Merilyn Baba M.D.   On: 12/11/2021 13:07   CT ABDOMEN PELVIS W CONTRAST  Result Date: 12/10/2021 CLINICAL DATA:  LEFT lower quadrant abdominal pain.  Sepsis EXAM: CT ABDOMEN AND PELVIS WITH CONTRAST TECHNIQUE: Multidetector CT imaging of the abdomen and pelvis was performed using the standard protocol following bolus administration of intravenous contrast. RADIATION DOSE REDUCTION: This exam was performed according to the departmental dose-optimization program which includes automated exposure control, adjustment of the mA and/or kV according to patient size and/or use of iterative reconstruction technique.  CONTRAST:  60m OMNIPAQUE IOHEXOL 350 MG/ML SOLN COMPARISON:  None Available. FINDINGS: Lower chest: Lung bases are clear. Hepatobiliary: No focal hepatic lesion. No biliary duct dilatation. Common bile duct is normal. Pancreas: Pancreas is normal. No ductal dilatation. No pancreatic inflammation. Spleen: Normal spleen Adrenals/urinary tract: Adrenal glands normal. Bilateral simple fluid attenuation renal cysts. No follow-up recommended for Bosniak 1 renal cysts. Ureters and bladder normal. Stomach/Bowel: Stomach, small bowel, appendix, and cecum are normal. The colon and rectosigmoid colon are normal. Vascular/Lymphatic: Abdominal aorta is normal caliber. No periportal or retroperitoneal adenopathy. No pelvic adenopathy. Reproductive: Post prostatectomy Other: Prior LEFT inguinal hernia repair.  No evidence recurrence. Musculoskeletal: No aggressive osseous lesion. IMPRESSION: 1. No acute findings abdomen pelvis. 2. Prior LEFT inguinal hernia repair without complication. 3. Bilateral benign renal cysts.  No obstructive uropathy Electronically Signed   By: SSuzy BouchardM.D.   On: 12/10/2021 16:15   DG Chest 2 View  Result Date: 12/10/2021 CLINICAL DATA:  Chest pain. EXAM: CHEST - 2 VIEW COMPARISON:  Chest x-ray 09/07/2019. FINDINGS: The heart size and mediastinal contours are within normal limits. Both lungs are clear. No visible pleural effusions or pneumothorax. No acute osseous abnormality. IMPRESSION: No active cardiopulmonary disease. Electronically Signed   By: FMargaretha SheffieldM.D.   On: 12/10/2021 12:44   CT ABDOMEN PELVIS W CONTRAST  Result Date: 11/29/2021 CLINICAL DATA:  Abdominal pain and emesis EXAM: CT ABDOMEN AND PELVIS WITH CONTRAST TECHNIQUE: Multidetector CT imaging of the abdomen and pelvis was performed using the standard protocol following bolus administration of intravenous contrast. RADIATION DOSE REDUCTION: This exam was performed according to the departmental dose-optimization  program which includes automated exposure control, adjustment of the mA and/or kV according to patient size and/or use of iterative reconstruction technique. CONTRAST:  736mOMNIPAQUE IOHEXOL 350 MG/ML SOLN COMPARISON:  CT abdomen and pelvis dated July 13th 2021 FINDINGS: Lower chest: No acute abnormality. Hepatobiliary: No focal liver abnormality is seen. No gallstones, gallbladder wall thickening, or biliary dilatation. Pancreas: Unremarkable. No pancreatic ductal dilatation or surrounding inflammatory changes. Spleen: Normal in size without focal abnormality. Adrenals/Urinary Tract: Bilateral adrenal glands are unremarkable. No hydronephrosis or nephrolithiasis. Bilateral low-attenuation renal lesions largest are compatible with simple cysts, others are too small to completely characterize, no further follow-up imaging is recommended. Bladder is unremarkable. Stomach/Bowel: Stomach is within normal limits. Appendix appears normal. No evidence of bowel wall thickening, distention, or inflammatory changes. Vascular/Lymphatic: No significant vascular findings are present. No enlarged abdominal or pelvic lymph nodes. Reproductive: Prostate is surgically absent. Ovoid structures previously described along the pelvic sidewalls are no longer present, likely resolved hematomas. Other: No abdominal wall hernia or abnormality. No abdominopelvic ascites. Musculoskeletal: No acute or significant osseous findings. IMPRESSION: No acute findings in the abdomen or pelvis.  Electronically Signed   By: Yetta Glassman M.D.   On: 11/29/2021 08:52   DG Lumbar Spine Complete  Result Date: 11/18/2021 CLINICAL DATA:  Lumbosacral back pain and radiculopathy. EXAM: LUMBAR SPINE - COMPLETE 4+ VIEW COMPARISON:  CT of the abdomen September 07, 2019 FINDINGS: There is no evidence of lumbar spine fracture.  Alignment is normal. Multilevel osteoarthritic changes with disc space narrowing, endplate sclerosis and osteophytosis. Moderate posterior  facet arthropathy in the lower lumbosacral spine. IMPRESSION: 1. Multilevel osteoarthritic changes. 2. Moderate posterior facet arthropathy in the lower lumbosacral spine. Electronically Signed   By: Fidela Salisbury M.D.   On: 11/18/2021 18:16    Labs:  CBC: Recent Labs    12/10/21 2331 12/11/21 0500 12/11/21 1123 12/12/21 0254 12/13/21 0209  WBC 3.1* 2.8*  --  3.1* 2.6*  HGB 6.3* 7.4*  --  8.4* 7.3*  HCT 20.6* 22.9*  --  26.0* 22.4*  PLT 66* 63* 66* 53* 43*    COAGS: Recent Labs    12/11/21 0500 12/11/21 1123  INR 1.4* 1.4*  APTT  --  30    BMP: Recent Labs    12/10/21 1219 12/11/21 0500 12/12/21 0254 12/13/21 0209  NA 133* 136 137 135  K 3.9 4.4 4.3 4.2  CL 100 106 105 106  CO2 20* 21* 21* 20*  GLUCOSE 148* 103* 99 150*  BUN _0 CALCIUM 8.4* 8.0* 8.8* 8.0*  CREATININE 1.14 0.97 1.03 1.16  GFRNONAA >60 >60 >60 >60    LIVER FUNCTION TESTS: Recent Labs    12/10/21 1219 12/11/21 0500 12/11/21 1123 12/12/21 0254 12/13/21 0209  BILITOT 1.9* 1.6* 1.9* 1.9* 1.2  AST 34 26  --  29 30  ALT 31 22  --  25 22  ALKPHOS 97 87  --  97 107  PROT 6.2* 5.2*  --  5.9* 5.2*  ALBUMIN 2.5* 2.1*  --  2.3* 2.0*    TUMOR MARKERS: No results for input(s): "AFPTM", "CEA", "CA199", "CHROMGRNA" in the last 8760 hours.  Assessment and Plan: 71 y.o. male with worsening pancytopenia with unclear etiology who is in need of BMBx for further eval and management at the request of Dr. Irene Limbo.   VSS, afebrile  Urine and blood cx no growth so far  CBC with worsening pancytopenia, hgb 7.3 and plt 43  Not on AC/AP   Risks and benefits of BMBx was discussed with the patient and/or patient's family including, but not limited to bleeding, infection, damage to adjacent structures or low yield requiring additional tests.  All of the questions were answered and there is agreement to proceed.  Consent signed and in chart.  The procedure is tentatively scheduled at 9 am on  Friday pending IR schedule.   NPO p MN.    Thank you for this interesting consult.  I greatly enjoyed meeting Fitzpatrick Alberico and look forward to participating in their care.  A copy of this report was sent to the requesting provider on this date.  Electronically Signed: Docia Barrier, PA 12/13/2021, 3:06 PM   I spent a total of 40 Minutes    in face to face in clinical consultation, greater than 50% of which was counseling/coordinating care for pancytopenia.

## 2021-12-13 NOTE — Plan of Care (Signed)
  Problem: Fluid Volume: Goal: Hemodynamic stability will improve Outcome: Progressing   Problem: Clinical Measurements: Goal: Diagnostic test results will improve Outcome: Progressing Goal: Signs and symptoms of infection will decrease Outcome: Progressing   Problem: Respiratory: Goal: Ability to maintain adequate ventilation will improve Outcome: Progressing   Problem: Urinary Elimination: Goal: Signs and symptoms of infection will decrease Outcome: Progressing   Problem: Education: Goal: Knowledge of General Education information will improve Description: Including pain rating scale, medication(s)/side effects and non-pharmacologic comfort measures Outcome: Progressing   Problem: Health Behavior/Discharge Planning: Goal: Ability to manage health-related needs will improve Outcome: Progressing   Problem: Activity: Goal: Risk for activity intolerance will decrease Outcome: Progressing   Problem: Nutrition: Goal: Adequate nutrition will be maintained Outcome: Progressing   Problem: Coping: Goal: Level of anxiety will decrease Outcome: Progressing   Problem: Pain Managment: Goal: General experience of comfort will improve Outcome: Progressing   Problem: Safety: Goal: Ability to remain free from injury will improve Outcome: Progressing   Problem: Safety: Goal: Ability to remain free from injury will improve Outcome: Progressing

## 2021-12-13 NOTE — TOC Initial Note (Signed)
Transition of Care [facility]) - Initial/Assessment Note    Patient Details  Name: Tyler [name] MRN: [id number] Date of Birth: 1950-11-26  Transition of [facility]) CM/SW Contact:    Tom-Johnson, Renea Ee, RN Phone Number: 12/13/2021, 2:15 PM  Clinical Narrative:                  CM spoke with patient and spouse at bedside about needs for post hospital transition. Admitted for Sepsis 2/2 UTI, on IV abx.  Patient is originally form [place]. Lives at home with wife. Has eight children, seven in [place] and one in [place]. Has relatives out of state. Employed, does not drive, uses Company's transportation for work and friend's transports to and from appointments.   Does not have DME's at home. PCP is [place], Garen Lah, FNP and [facility] [address].  Outpatient PT recommended and patient requested [facility] [employer] from [employer].gov list, info on AVS.  CM will continue to follow as patient progresses with care towards discharge.    Expected Discharge Plan: OP Rehab Barriers to Discharge: Continued Medical Work up   Patient Goals and CMS Choice Patient states their goals for this hospitalization and ongoing recovery are:: To return home CMS Medicare.gov Compare Post Acute Care list provided to:: Patient Choice offered to / list presented to : Patient, Spouse  Expected Discharge Plan and Services Expected Discharge Plan: OP Rehab   Discharge Planning Services: CM Consult Post Acute Care Choice: NA Living arrangements for the past 2 months: Single Family Home                 DME Arranged: N/A DME Agency: NA       HH Arranged: NA HH Agency: NA        Prior Living Arrangements/Services Living arrangements for the past 2 months: Single Family Home Lives with:: Spouse Patient language and need for interpreter reviewed:: Yes Do you feel safe going back to the place where you live?: Yes      Need for Family Participation in Patient Care: Yes  (Comment) Care giver support system in place?: Yes (comment)   Criminal Activity/Legal Involvement Pertinent to Current Situation/Hospitalization: No - Comment as needed  Activities of Daily Living Home Assistive Devices/Equipment: None ADL Screening (condition at time of admission) Patient's cognitive ability adequate to safely complete daily activities?: Yes Is the patient deaf or have difficulty hearing?: No Does the patient have difficulty seeing, even when wearing glasses/contacts?: No Does the patient have difficulty concentrating, remembering, or making decisions?: No Patient able to express need for assistance with ADLs?: Yes Does the patient have difficulty dressing or bathing?: No Independently performs ADLs?: Yes (appropriate for developmental age) Does the patient have difficulty walking or climbing stairs?: Yes Weakness of Legs: Both Weakness of Arms/Hands: None  Permission Sought/Granted Permission sought to share information with : Case Manager, Customer service manager, Family Supports Permission granted to share information with : Yes, Verbal Permission Granted              Emotional Assessment Appearance:: Appears stated age Attitude/Demeanor/Rapport: Engaged, Gracious Affect (typically observed): Accepting, Appropriate, Calm, Hopeful, Pleasant Orientation: : Oriented to Self, Oriented to Place, Oriented to  Time, Oriented to Situation Alcohol / Substance Use: Not Applicable Psych Involvement: No (comment)  Admission diagnosis:  Sepsis secondary to UTI ([place]) [A41.9, N39.0] Symptomatic anemia [D64.9] Sepsis, due to unspecified organism, unspecified whether acute organ dysfunction present [facility]) [A41.9] Patient Active Problem List   Diagnosis Date  Noted   Symptomatic anemia    UTI (urinary tract infection) 12/10/2021   Sepsis ([place]) 12/10/2021   Normocytic anemia 12/10/2021   Thrombocytopenia ([place]) 12/10/2021   Hyponatremia 12/10/2021   Sepsis  secondary to UTI ([place]) 12/10/2021   Pelvic hematoma in male 09/08/2019   Bilateral pulmonary embolism ([place]) 09/05/2019   Paroxysmal atrial fibrillation ([place]) 09/05/2019   Hypertension    Hypokalemia    Prostate cancer ([place]) 09/01/2019   PCP:  [name], FNP [id number] [place] [place]Phone: [phone]: [phone] [facility] [address] [place]Phone: [phone]: [phone]    Social Determinants of Health (SDOH) Interventions    Readmission Risk Interventions     No data to display

## 2021-12-13 NOTE — Evaluation (Signed)
Physical Therapy Evaluation Patient Details Name: Tyler [name] MRN: [id number] DOB: [birth year] Today's Date: 12/13/2021  History of Present Illness  71 y.o. male adm 12/10/21 with sciatica, fever, and anemia 6.2. sepsis 2/2 UTI; MRI spine shows epidural lipomatosis; PMH prostate cancer status post prostatectomy in 2021, history of bilateral pulmonary embolism, essential hypertension  Clinical Impression  PTA pt living with wife and daughter in second story apartment with flight of stairs to enter. PTA pt independent with mobility, ADLs and iADLS. Pt reports for the last 3-4 months occasional sharp shooting pain down L LE with movement of L LE and/or R LE, within the past week he has experienced that pain down his R LE as well and lately feels it also shoots up his neck to his brain. PT unable to illicit pain with ROM, MMT or mobility. Pt overall has mild instability with his gait without DME, but requires no outside assist. Explanation of pain onset and inability to reproduce pain make musculoskeletal origin less likely, however pt would benefit from Outpatient PT Eval for further assessment. PT will continue to work acutely with pt on stairs and DME use for times when pain is present.       Recommendations for follow up therapy are one component of a multi-disciplinary discharge planning process, led by the attending physician.  Recommendations may be updated based on patient status, additional functional criteria and insurance authorization.  Follow Up Recommendations Outpatient PT      Assistance Recommended at Discharge Intermittent Supervision/Assistance  Patient can return home with the following  Assist for transportation;Help with stairs or ramp for entrance    Equipment Recommendations Rolling walker (2 wheels)  Recommendations for Other Services       Functional Status Assessment Patient has had a recent decline in their functional status and demonstrates the ability to make  significant improvements in function in a reasonable and predictable amount of time.     Precautions / Restrictions Precautions Precautions: Fall Precaution Comments: reports 3 falls with onset of pain Restrictions Weight Bearing Restrictions: No      Mobility  Bed Mobility Overal bed mobility: Independent                  Transfers Overall transfer level: Modified independent Equipment used: None               General transfer comment: increased effort to attain balance    Ambulation/Gait Ambulation/Gait assistance: Min guard Gait Distance (Feet): 150 Feet Assistive device: None Gait Pattern/deviations: Step-through pattern, Decreased step length - right, Decreased step length - left, Shuffle, Wide base of support Gait velocity: slowed Gait velocity interpretation: <1.8 ft/sec, indicate of risk for recurrent falls   General Gait Details: min guard for safety, initially reaching out for furniture in room to hold on to however once out in hallway did not reach out to arm rail, pt ambulates with wider BoS and increased lateral sway      Balance Overall balance assessment: Mild deficits observed, not formally tested, History of Falls, Modified Independent (lacks higher level balance skills)                                           Pertinent Vitals/Pain Pain Assessment Pain Assessment: No/denies pain    Home Living Family/patient expects to be discharged to:: Private residence Living Arrangements: Spouse/significant other;Children Available Help at Discharge:  Family;Available PRN/intermittently (wife and daughter work) Type of Home: Apartment Home Access: Stairs to enter Entrance Stairs-Rails: Psychiatric nurse of Steps: flight   Home Layout: One level        Prior Function Prior Level of Function : Independent/Modified Independent             Mobility Comments: ambulating independently limited community  distances ADLs Comments: independent in ADLs and iADLs        Extremity/Trunk Assessment   Upper Extremity Assessment Upper Extremity Assessment: Overall WFL for tasks assessed    Lower Extremity Assessment Lower Extremity Assessment: RLE deficits/detail;LLE deficits/detail RLE Deficits / Details: ROM WLF, strength hip flex 4/5, knee ext 4+/5 knee flex 4+/5, ankle dorsiflex 4/5, ankle plantar 4/5 RLE Sensation: WNL RLE Coordination: WNL LLE Deficits / Details: ROM WLF, strength hip flex 4/5, knee ext 4+/5 knee flex 4+/5, ankle dorsiflex 4/5, ankle plantar 4/5 LLE Sensation: WNL LLE Coordination: WNL    Cervical / Trunk Assessment Cervical / Trunk Assessment: Other exceptions Cervical / Trunk Exceptions: reports neck and brain pain sometimes with onset of sciatic pain  Communication   Communication: Other (comment) ([place] native language, able to [place] and speaks broken [place], refuses electronic interpreter)  Cognition Arousal/Alertness: Awake/alert Behavior During Therapy: WFL for tasks assessed/performed Overall Cognitive Status: Within Functional Limits for tasks assessed                                          General Comments General comments (skin integrity, edema, etc.): pt with 2/4 DoE with activity, max HR noted 124 bpm, wife in room        Assessment/Plan    PT Assessment Patient needs continued PT services  PT Problem List Decreased balance       PT Treatment Interventions DME instruction;Gait training;Stair training;Functional mobility training;Therapeutic activities;Therapeutic exercise;Balance training;Cognitive remediation;Patient/family education    PT Goals (Current goals can be found in the Care Plan section)  Acute Rehab PT Goals PT Goal Formulation: With patient/family Time For Goal Achievement: 12/27/21 Potential to Achieve Goals: Good    Frequency Min 3X/week        AM-PAC PT "6 Clicks" Mobility   Outcome Measure Help needed turning from your back to your side while in a flat bed without using bedrails?: None Help needed moving from lying on your back to sitting on the side of a flat bed without using bedrails?: None Help needed moving to and from a bed to a chair (including a wheelchair)?: None Help needed standing up from a chair using your arms (e.g., wheelchair or bedside chair)?: None Help needed to walk in hospital room?: None Help needed climbing 3-5 steps with a railing? : A Little 6 Click Score: 23    End of Session Equipment Utilized During Treatment: Gait belt Activity Tolerance: Patient tolerated treatment well Patient left: in bed;with call bell/phone within reach;with bed alarm set Nurse Communication: Mobility status PT Visit Diagnosis: Unsteadiness on feet (R26.81);Muscle weakness (generalized) (M62.81);History of falling (Z91.81);Difficulty in walking, not elsewhere classified (R26.2)    Time: 0962-8366 PT Time Calculation (min) (ACUTE ONLY): 28 min   Charges:   PT Evaluation $PT Eval Moderate Complexity: 1 Mod PT Treatments $Therapeutic Activity: 8-22 mins        Rosslyn Pasion B. Migdalia Dk PT, DPT Acute Rehabilitation Services Please use secure chat or  Call Office [phone]  [place]ney 12/13/2021, 2:20 PM

## 2021-12-13 NOTE — Progress Notes (Signed)
Pharmacy Antibiotic Note  Tyler [name] is a 71 y.o. male admitted on 12/10/2021 with sepsis.  Pharmacy has been consulted for cefepime dosing. Plan: Continue cefepime 2g every 8 hours.    Height: '5\' 7"'$  (170.2 cm) Weight: 88.5 kg (195 lb 1.7 oz) IBW/kg (Calculated) : 66.1  Temp (24hrs), Avg:98.2 F (36.8 C), Min:98 F (36.7 C), Max:98.4 F (36.9 C)  Recent Labs  Lab 12/10/21 1219 12/10/21 1226 12/10/21 1416 12/10/21 2330 12/10/21 2331 12/11/21 0500 12/12/21 0254 12/13/21 0209  WBC 3.8*  --   --   --  3.1* 2.8* 3.1* 2.6*  CREATININE 1.14  --   --   --   --  0.97 1.03 1.16  LATICACIDVEN  --  5.0* 3.7* 1.8  --   --   --   --      Estimated Creatinine Clearance: 63.8 mL/min (by C-G formula based on SCr of 1.16 mg/dL).    Allergies  Allergen Reactions   Amlodipine Swelling    BILATERAL FEET TO ANKLES   Micro's 10/17- Bcx- ngtd 10/17- Ucx- ngtdF  Thank you for allowing pharmacy to be a part of this patient's care.  Vaughan Basta BS, PharmD, BCPS Clinical Pharmacist 12/13/2021 7:17 AM  Contact: [phone] after 3 PM  "Be curious, not judgmental..." -Jamal Maes

## 2021-12-13 NOTE — Progress Notes (Signed)
Hematology Short Note  .    Latest Ref Rng & Units 12/13/2021    2:09 AM 12/12/2021    2:54 AM 12/11/2021   11:23 AM  CBC  WBC 4.0 - 10.5 K/uL 2.6  3.1    Hemoglobin 13.0 - 17.0 g/dL 7.3  8.4    Hematocrit 39.0 - 52.0 % 22.4  26.0    Platelets 150 - 400 K/uL 43  53  66    .CBC    Component Value Date/Time   WBC 2.6 (L) 12/13/2021 0209   RBC 2.54 (L) 12/13/2021 0209   HGB 7.3 (L) 12/13/2021 0209   HCT 22.4 (L) 12/13/2021 0209   PLT 43 (L) 12/13/2021 0209   MCV 88.2 12/13/2021 0209   MCH 28.7 12/13/2021 0209   MCHC 32.6 12/13/2021 0209   RDW 22.3 (H) 12/13/2021 0209   LYMPHSABS 0.8 12/13/2021 0209   MONOABS 0.3 12/13/2021 0209   EOSABS 0.0 12/13/2021 0209   BASOSABS 0.0 12/13/2021 0209   .    Latest Ref Rng & Units 12/13/2021    2:09 AM 12/12/2021    2:54 AM 12/11/2021   11:23 AM  CMP  Glucose 70 - 99 mg/dL 150  99    BUN 8 - 23 mg/dL 13  10    Creatinine 0.61 - 1.24 mg/dL 1.16  1.03    Sodium 135 - 145 mmol/L 135  137    Potassium 3.5 - 5.1 mmol/L 4.2  4.3    Chloride 98 - 111 mmol/L 106  105    CO2 22 - 32 mmol/L 20  21    Calcium 8.9 - 10.3 mg/dL 8.0  8.8    Total Protein 6.5 - 8.1 g/dL 5.2  5.9    Total Bilirubin 0.3 - 1.2 mg/dL 1.2  1.9  1.9   Alkaline Phos 38 - 126 U/L 107  97    AST 15 - 41 U/L 30  29    ALT 0 - 44 U/L 22  25      Pancytopenia in the context of multiple potential etiologies. Though this could still be his UTI/iburpofen/ETOH excess given progressive pancytoipenia and elevated LDH levels will proceed with ordering BM Bx as discussed with patient.  I shall be out of town from 10/19 andshall return on 10/23--- if any other questions arise pending BM Bx results--plz call oncoloigist on call.  Sullivan Lone MD

## 2021-12-14 ENCOUNTER — Inpatient Hospital Stay (HOSPITAL_COMMUNITY): Payer: 59

## 2021-12-14 ENCOUNTER — Other Ambulatory Visit: Payer: Self-pay | Admitting: Hematology

## 2021-12-14 DIAGNOSIS — E871 Hypo-osmolality and hyponatremia: Secondary | ICD-10-CM | POA: Diagnosis not present

## 2021-12-14 DIAGNOSIS — D696 Thrombocytopenia, unspecified: Secondary | ICD-10-CM | POA: Diagnosis not present

## 2021-12-14 DIAGNOSIS — D649 Anemia, unspecified: Secondary | ICD-10-CM | POA: Diagnosis not present

## 2021-12-14 DIAGNOSIS — N390 Urinary tract infection, site not specified: Secondary | ICD-10-CM | POA: Diagnosis not present

## 2021-12-14 LAB — CBC WITH DIFFERENTIAL/PLATELET
Abs Immature Granulocytes: 0.02 10*3/uL (ref 0.00–0.07)
Basophils Absolute: 0 10*3/uL (ref 0.0–0.1)
Basophils Relative: 0 %
Eosinophils Absolute: 0 10*3/uL (ref 0.0–0.5)
Eosinophils Relative: 0 %
HCT: 21.9 % — ABNORMAL LOW (ref 39.0–52.0)
Hemoglobin: 6.8 g/dL — CL (ref 13.0–17.0)
Immature Granulocytes: 1 %
Lymphocytes Relative: 35 %
Lymphs Abs: 0.8 10*3/uL (ref 0.7–4.0)
MCH: 28.1 pg (ref 26.0–34.0)
MCHC: 31.1 g/dL (ref 30.0–36.0)
MCV: 90.5 fL (ref 80.0–100.0)
Monocytes Absolute: 0.3 10*3/uL (ref 0.1–1.0)
Monocytes Relative: 12 %
Neutro Abs: 1.2 10*3/uL — ABNORMAL LOW (ref 1.7–7.7)
Neutrophils Relative %: 52 %
Platelets: 43 10*3/uL — ABNORMAL LOW (ref 150–400)
RBC: 2.42 MIL/uL — ABNORMAL LOW (ref 4.22–5.81)
RDW: 21.9 % — ABNORMAL HIGH (ref 11.5–15.5)
WBC: 2.3 10*3/uL — ABNORMAL LOW (ref 4.0–10.5)
nRBC: 7.1 % — ABNORMAL HIGH (ref 0.0–0.2)

## 2021-12-14 LAB — COMPREHENSIVE METABOLIC PANEL
ALT: 26 U/L (ref 0–44)
AST: 38 U/L (ref 15–41)
Albumin: 2 g/dL — ABNORMAL LOW (ref 3.5–5.0)
Alkaline Phosphatase: 119 U/L (ref 38–126)
Anion gap: 11 (ref 5–15)
BUN: 13 mg/dL (ref 8–23)
CO2: 19 mmol/L — ABNORMAL LOW (ref 22–32)
Calcium: 8.2 mg/dL — ABNORMAL LOW (ref 8.9–10.3)
Chloride: 105 mmol/L (ref 98–111)
Creatinine, Ser: 0.99 mg/dL (ref 0.61–1.24)
GFR, Estimated: >60 mL/min (ref >60)
Glucose, Bld: 117 mg/dL — ABNORMAL HIGH (ref 70–99)
Potassium: 3.9 mmol/L (ref 3.5–5.1)
Sodium: 135 mmol/L (ref 135–145)
Total Bilirubin: 1.5 mg/dL — ABNORMAL HIGH (ref 0.3–1.2)
Total Protein: 5.1 g/dL — ABNORMAL LOW (ref 6.5–8.1)

## 2021-12-14 LAB — MULTIPLE MYELOMA PANEL, SERUM
Albumin SerPl Elph-Mcnc: 2.3 g/dL — ABNORMAL LOW (ref 2.9–4.4)
Albumin/Glob SerPl: 0.8 (ref 0.7–1.7)
Alpha 1: 0.4 g/dL (ref 0.0–0.4)
Alpha2 Glob SerPl Elph-Mcnc: 0.7 g/dL (ref 0.4–1.0)
B-Globulin SerPl Elph-Mcnc: 1 g/dL (ref 0.7–1.3)
Gamma Glob SerPl Elph-Mcnc: 1 g/dL (ref 0.4–1.8)
Globulin, Total: 3.1 g/dL (ref 2.2–3.9)
IgA: 182 mg/dL (ref 61–437)
IgG (Immunoglobin G), Serum: 833 mg/dL (ref 603–1613)
IgM (Immunoglobulin M), Srm: 66 mg/dL (ref 20–172)
Total Protein ELP: 5.4 g/dL — ABNORMAL LOW (ref 6.0–8.5)

## 2021-12-14 LAB — HEMOGLOBIN AND HEMATOCRIT, BLOOD
HCT: 21.9 % — ABNORMAL LOW (ref 39.0–52.0)
Hemoglobin: 7.1 g/dL — ABNORMAL LOW (ref 13.0–17.0)

## 2021-12-14 LAB — PREPARE RBC (CROSSMATCH)

## 2021-12-14 LAB — MAGNESIUM: Magnesium: 1.7 mg/dL (ref 1.7–2.4)

## 2021-12-14 MED ORDER — MIDAZOLAM HCL 2 MG/2ML IJ SOLN
INTRAMUSCULAR | Status: AC | PRN
  Administered 2021-12-14: 1 mg via INTRAVENOUS
  Administered 2021-12-14: .5 mg via INTRAVENOUS

## 2021-12-14 MED ORDER — FENTANYL CITRATE (PF) 100 MCG/2ML IJ SOLN
INTRAMUSCULAR | Status: AC | PRN
  Administered 2021-12-14: 25 ug via INTRAVENOUS
  Administered 2021-12-14: 50 ug via INTRAVENOUS

## 2021-12-14 MED ORDER — MIDAZOLAM HCL 2 MG/2ML IJ SOLN
INTRAMUSCULAR | Status: AC
  Filled 2021-12-14: qty 2

## 2021-12-14 MED ORDER — FENTANYL CITRATE (PF) 100 MCG/2ML IJ SOLN
INTRAMUSCULAR | Status: AC
  Filled 2021-12-14: qty 2

## 2021-12-14 MED ORDER — SODIUM CHLORIDE 0.9% IV SOLUTION: Freq: Once | INTRAVENOUS | Status: AC

## 2021-12-14 MED ORDER — LIDOCAINE HCL (PF) 1 % IJ SOLN
5.0000 mL | Freq: Once | INTRAMUSCULAR | Status: AC
  Administered 2021-12-14: 5 mL

## 2021-12-14 NOTE — Progress Notes (Signed)
PROGRESS NOTE    Tyler [name]  RZN:[id number] DOB: March 15, 1950 DOA: 12/10/2021 PCP: [name], FNP   Brief Narrative:  71 y.o. male with a past medical history of prostate cancer status post prostatectomy in 2021, history of bilateral pulmonary embolism after his surgery in 2021, essential hypertension presented with worsening back pain radiating down to right leg along with fevers and hematuria.  On presentation, his hemoglobin was 6.3.  He was started on broad-spectrum antibiotics for possible UTI.  Subsequently, oncology was consulted for pancytopenia and neurosurgery was consulted for lumbosacral stenosis and severe lower back pain.  Assessment & Plan:   Sepsis: Present on admission Possible UTI: Present on admission -Presented with positive nitrites in UA but no pyuria and only a few bacteria.  Blood and urine cultures negative so far -Currently on cefepime.  Today is day 5 of antibiotics: DC antibiotics after today's dose if no temperature spikes.  Vancomycin discontinued on 12/12/2021.  No temperature spikes over the last 48 hours. -Has significantly elevated CRP and has pancytopenia.  Unclear if the fever is from an underlying hematological process -CT of abdomen and pelvis was negative for any acute findings. -MRI of lumbar spine did not show signs of osteomyelitis  Pancytopenia with symptomatic anemia -Status post 2 units packed red cell transfusion during this hospitalization.  Hemoglobin 6.8 this morning.  Transfuse 1 more unit of packed red cells monitor H&H.  No overt signs of bleeding. -Oncology following.  IR is planning for bone marrow biopsy today. -Normal vitamin B12 and folate levels. -No overt signs of bleeding.  Platelets 43 again today.  Abdominal pain -Lipase normal.  LFTs almost unremarkable.  CT of abdomen and pelvis did not show any acute findings.  Continue PPI for now.  Abdominal pain improving.  Severe lower back pain radiating to her right lower  extremity Lumbosacral stenosis -Still having intermittent significant lower back pain radiating to right lower extremity.  MRI of lumbar spine showed moderate to severe thecal sac narrowing posterior to L5 and severe thecal sac narrowing of L5-S1.   -Neurosurgery evaluation appreciated: Patient is not an operative candidate at this time.  Neurosurgery does not believe that patient has severe lumbosacral stenosis and it is unlikely that epidural lipomatosis is responsible for such severe pain. -PT recommends outpatient PT  History of bilateral PE in 2021 -He was on Eliquis up until 2 to 3 months ago when it was discontinued by his urologist.  It was a provoked PE after his prostatectomy in 2021.  Alcohol abuse -Heavy consumption in the last 2 to 3 months per patient's daughter.  No evidence of withdrawal currently.  Continue CIWA protocol.  Continue thiamine, multivitamins and folic acid.  History of prostate cancer status post prostatectomy in 2021 -Followed by Dr. [name] with urology.  PSA was undetectable earlier this month.  Results available in care everywhere.  Obesity -Outpatient follow-up  DVT prophylaxis: SCDs Code Status: Full Family Communication: None at bedside Disposition Plan: Status is: Inpatient Remains inpatient appropriate because: Of severity of illness.  Consultants: Oncology  Procedures: None  Antimicrobials: Cefepime from 12/10/2021 onwards.  Vancomycin 12/10/2021-12/11/2021   Subjective: Patient seen and examined at bedside.  No fever, worsening shortness of breath or chest pain reported.  Still complains of intermittent lower back pain but improving.   Objective: Vitals:   12/13/21 1636 12/13/21 2211 12/14/21 0530 12/14/21 0536  BP: 121/80 133/78  118/74  Pulse: 93 (!) 102  96  Resp: 18 18  20  Temp: 98.5 F (36.9 C) 99.8 F (37.7 C)  98.7 F (37.1 C)  TempSrc: Oral Oral    SpO2: 100% 100%  100%  Weight:   84.8 kg   Height:        Intake/Output  Summary (Last 24 hours) at 12/14/2021 0738 Last data filed at 12/14/2021 0000 Gross per 24 hour  Intake 120 ml  Output 0 ml  Net 120 ml    Filed Weights   12/10/21 1424 12/14/21 0530  Weight: 88.5 kg 84.8 kg    Examination:  General: No acute distress currently.  Still on room air.  Looks chronically ill and deconditioned. ENT/neck: No JVD elevation or palpable neck masses noted  respiratory: Bilateral decreased breath sounds at bases with some scattered crackles  CVS: Mostly rate controlled; S1 and S2 are heard Abdominal: Soft, nontender, distended mildly; no organomegaly, bowel sounds are heard normally Extremities: No clubbing; mild lower extremity edema present CNS: Awake and oriented.  No focal neurologic deficit.  Able to move extremities Lymph: No palpable lymphadenopathy noted  skin: No obvious petechiae/rashes psych: Flat affect.  Showing no signs of agitation.   Musculoskeletal: No obvious joint swelling/deformity    Data Reviewed: I have personally reviewed following labs and imaging studies  CBC: Recent Labs  Lab 12/10/21 2331 12/11/21 0500 12/11/21 1123 12/12/21 0254 12/13/21 0209 12/14/21 0421  WBC 3.1* 2.8*  --  3.1* 2.6* 2.3*  NEUTROABS 1.7 1.8  --  1.8 1.5* 1.2*  HGB 6.3* 7.4*  --  8.4* 7.3* 6.8*  HCT 20.6* 22.9*  --  26.0* 22.4* 21.9*  MCV 92.4 87.4  --  87.8 88.2 90.5  PLT 66* 63* 66* 53* 43* 43*    Basic Metabolic Panel: Recent Labs  Lab 12/10/21 1219 12/10/21 2331 12/11/21 0500 12/12/21 0254 12/13/21 0209 12/14/21 0421  NA 133*  --  136 137 135 135  K 3.9  --  4.4 4.3 4.2 3.9  CL 100  --  106 105 106 105  CO2 20*  --  21* 21* 20* 19*  GLUCOSE 148*  --  103* 99 150* 117*  BUN 13  --  11 10 13 13   CREATININE 1.14  --  0.97 1.03 1.16 0.99  CALCIUM 8.4*  --  8.0* 8.8* 8.0* 8.2*  MG  --  1.9 1.7  --  1.9 1.7    GFR: Estimated Creatinine Clearance: 73.3 mL/min (by C-G formula based on SCr of 0.99 mg/dL). Liver Function  Tests: Recent Labs  Lab 12/10/21 1219 12/11/21 0500 12/11/21 1123 12/12/21 0254 12/13/21 0209 12/14/21 0421  AST 34 26  --  29 30 38  ALT 31 22  --  25 22 26   ALKPHOS 97 87  --  97 107 119  BILITOT 1.9* 1.6* 1.9* 1.9* 1.2 1.5*  PROT 6.2* 5.2*  --  5.9* 5.2* 5.1*  ALBUMIN 2.5* 2.1*  --  2.3* 2.0* 2.0*    Recent Labs  Lab 12/10/21 1219  LIPASE 36    No results for input(s): "AMMONIA" in the last 168 hours. Coagulation Profile: Recent Labs  Lab 12/11/21 0500 12/11/21 1123  INR 1.4* 1.4*    Cardiac Enzymes: Recent Labs  Lab 12/10/21 1416  CKTOTAL 25*    BNP (last 3 results) No results for input(s): "PROBNP" in the last 8760 hours. HbA1C: No results for input(s): "HGBA1C" in the last 72 hours. CBG: No results for input(s): "GLUCAP" in the last 168 hours. Lipid Profile: No results for input(s): "  CHOL", "HDL", "LDLCALC", "TRIG", "CHOLHDL", "LDLDIRECT" in the last 72 hours. Thyroid Function Tests: No results for input(s): "TSH", "T4TOTAL", "FREET4", "T3FREE", "THYROIDAB" in the last 72 hours.  Anemia Panel: Recent Labs    12/11/21 1123 12/12/21 0254  FERRITIN  --  986*  TIBC  --  252  IRON  --  40*  RETICCTPCT 6.5* 6.7*    Sepsis Labs: Recent Labs  Lab 12/10/21 1226 12/10/21 1416 12/10/21 2330 12/11/21 0500 12/12/21 0254 12/13/21 0209  PROCALCITON  --   --   --  0.20 0.16 0.24  LATICACIDVEN 5.0* 3.7* 1.8  --   --   --      Recent Results (from the past 240 hour(s))  Resp Panel by RT-PCR (Flu A&B, Covid) Anterior Nasal Swab     Status: None   Collection Time: 12/10/21 12:16 PM   Specimen: Anterior Nasal Swab  Result Value Ref Range Status   SARS Coronavirus 2 by RT PCR NEGATIVE NEGATIVE Final    Comment: (NOTE) SARS-CoV-2 target nucleic acids are NOT DETECTED.  The SARS-CoV-2 RNA is generally detectable in upper respiratory specimens during the acute phase of infection. The lowest concentration of SARS-CoV-2 viral copies this assay can  detect is 138 copies/mL. A negative result does not preclude SARS-Cov-2 infection and should not be used as the sole basis for treatment or other patient management decisions. A negative result may occur with  improper specimen collection/handling, submission of specimen other than nasopharyngeal swab, presence of viral mutation(s) within the areas targeted by this assay, and inadequate number of viral copies(<138 copies/mL). A negative result must be combined with clinical observations, patient history, and epidemiological information. The expected result is Negative.  Fact Sheet for Patients:  EntrepreneurPulse.com.au  Fact Sheet for Healthcare Providers:  IncredibleEmployment.be  This test is no t yet approved or cleared by the [place] FDA and  has been authorized for detection and/or diagnosis of SARS-CoV-2 by FDA under an Emergency Use Authorization (EUA). This EUA will remain  in effect (meaning this test can be used) for the duration of the COVID-19 declaration under Section 564(b)(1) of the Act, 21 U.S.C.section 360bbb-3(b)(1), unless the authorization is terminated  or revoked sooner.       Influenza A by PCR NEGATIVE NEGATIVE Final   Influenza B by PCR NEGATIVE NEGATIVE Final    Comment: (NOTE) The Xpert Xpress SARS-CoV-2/FLU/RSV plus assay is intended as an aid in the diagnosis of influenza from Nasopharyngeal swab specimens and should not be used as a sole basis for treatment. Nasal washings and aspirates are unacceptable for Xpert Xpress SARS-CoV-2/FLU/RSV testing.  Fact Sheet for Patients: EntrepreneurPulse.com.au  Fact Sheet for Healthcare Providers: IncredibleEmployment.be  This test is not yet approved or cleared by the [place] FDA and has been authorized for detection and/or diagnosis of SARS-CoV-2 by FDA under an Emergency Use Authorization (EUA). This EUA will remain in  effect (meaning this test can be used) for the duration of the COVID-19 declaration under Section 564(b)(1) of the Act, 21 U.S.C. section 360bbb-3(b)(1), unless the authorization is terminated or revoked.  Performed at [facility], [place] [address] [place], [place] [address]   Urine Culture     Status: None   Collection Time: 12/11/21  6:55 AM   Specimen: Urine, Clean Catch  Result Value Ref Range Status   Specimen Description URINE, CLEAN CATCH  Final   Special Requests NONE  Final   Culture   Final    NO GROWTH  Performed at [facility], [place] [address] [place], [place] [address]    Report Status 12/12/2021 FINAL  Final  Culture, blood (Routine X 2) w Reflex to ID Panel     Status: None (Preliminary result)   Collection Time: 12/11/21  6:55 AM   Specimen: BLOOD RIGHT HAND  Result Value Ref Range Status   Specimen Description BLOOD RIGHT HAND  Final   Special Requests   Final    BOTTLES DRAWN AEROBIC AND ANAEROBIC Blood Culture adequate volume   Culture   Final    NO GROWTH 2 DAYS Performed at [facility], [place] [address], [place], [place] [address]    Report Status PENDING  Incomplete  Culture, blood (Routine X 2) w Reflex to ID Panel     Status: None (Preliminary result)   Collection Time: 12/11/21  6:02 PM   Specimen: BLOOD  Result Value Ref Range Status   Specimen Description BLOOD LEFT ANTECUBITAL  Final   Special Requests   Final    BOTTLES DRAWN AEROBIC AND ANAEROBIC Blood Culture adequate volume   Culture   Final    NO GROWTH 2 DAYS Performed at [facility], [place] [address], [place], [place] [address]    Report Status PENDING  Incomplete         Radiology Studies: No results found.      Scheduled Meds:  sodium chloride   Intravenous Once   B-complex with vitamin C  1 tablet Oral Daily   feeding supplement  237 mL Oral BID BM   folic acid  1 mg Oral Daily   multivitamin with minerals  1 tablet Oral Daily   pantoprazole  (PROTONIX) IV  40 mg Intravenous Q12H   senna-docusate  1 tablet Oral BID   thiamine  100 mg Oral Daily   Continuous Infusions:  ceFEPime (MAXIPIME) IV 2 g (12/13/21 2155)          Aline August, MD [facility] 12/14/2021, 7:38 AM

## 2021-12-14 NOTE — Procedures (Signed)
Interventional Radiology Procedure Note  Date of Procedure: 12/14/2021  Procedure: BMBx  Findings:  1. CT BMBx right ilium    Complications: No immediate complications noted.   Estimated Blood Loss: minimal  Follow-up and Recommendations: 1. Bedrest 1 hour    Albin Felling, MD  Vascular & Interventional Radiology  12/14/2021 9:29 AM

## 2021-12-14 NOTE — Progress Notes (Signed)
Called patients daughter yesterday and today. Unable to reach -- going to voice mail. Left her a message with update and plan for BM Bx today. Will reconnect with her with bone marrow biopsy results when available early next week.  Tyler [name]

## 2021-12-14 NOTE — TOC Progression Note (Addendum)
Transition of Care [facility]) - Progression Note    Patient Details  Name: Tyler [name] MRN: [id number] Date of Birth: December 18, 1950  Transition of Care [facility]) CM/SW Contact  Tom-Johnson, Renea Ee, RN Phone Number: 12/14/2021, 2:14 PM  Clinical Narrative:     Patient had bone marrow biopsy today. 1 unit PRBC given for hgb at 6.8, trending downwards. Oncology following. CM will continue to follow as patient progresses towards discharge.   Expected Discharge Plan: OP Rehab Barriers to Discharge: Continued Medical Work up  Expected Discharge Plan and Services Expected Discharge Plan: OP Rehab   Discharge Planning Services: CM Consult Post Acute Care Choice: NA Living arrangements for the past 2 months: Single Family Home                 DME Arranged: N/A DME Agency: NA       HH Arranged: NA HH Agency: NA         Social Determinants of Health (SDOH) Interventions    Readmission Risk Interventions     No data to display

## 2021-12-15 ENCOUNTER — Other Ambulatory Visit (HOSPITAL_COMMUNITY): Payer: 59

## 2021-12-15 ENCOUNTER — Inpatient Hospital Stay (HOSPITAL_COMMUNITY): Payer: 59

## 2021-12-15 DIAGNOSIS — D696 Thrombocytopenia, unspecified: Secondary | ICD-10-CM | POA: Diagnosis not present

## 2021-12-15 DIAGNOSIS — N390 Urinary tract infection, site not specified: Secondary | ICD-10-CM | POA: Diagnosis not present

## 2021-12-15 DIAGNOSIS — D649 Anemia, unspecified: Secondary | ICD-10-CM | POA: Diagnosis not present

## 2021-12-15 LAB — TYPE AND SCREEN
ABO/RH(D): O POS
Antibody Screen: NEGATIVE
Unit division: 0

## 2021-12-15 LAB — CBC WITH DIFFERENTIAL/PLATELET
Abs Immature Granulocytes: 0.02 10*3/uL (ref 0.00–0.07)
Basophils Absolute: 0 10*3/uL (ref 0.0–0.1)
Basophils Relative: 1 %
Eosinophils Absolute: 0 10*3/uL (ref 0.0–0.5)
Eosinophils Relative: 0 %
HCT: 25.1 % — ABNORMAL LOW (ref 39.0–52.0)
Hemoglobin: 8.3 g/dL — ABNORMAL LOW (ref 13.0–17.0)
Immature Granulocytes: 1 %
Lymphocytes Relative: 30 %
Lymphs Abs: 0.9 10*3/uL (ref 0.7–4.0)
MCH: 29 pg (ref 26.0–34.0)
MCHC: 33.1 g/dL (ref 30.0–36.0)
MCV: 87.8 fL (ref 80.0–100.0)
Monocytes Absolute: 0.3 10*3/uL (ref 0.1–1.0)
Monocytes Relative: 11 %
Neutro Abs: 1.6 10*3/uL — ABNORMAL LOW (ref 1.7–7.7)
Neutrophils Relative %: 57 %
Platelets: 39 10*3/uL — ABNORMAL LOW (ref 150–400)
RBC: 2.86 MIL/uL — ABNORMAL LOW (ref 4.22–5.81)
RDW: 21.2 % — ABNORMAL HIGH (ref 11.5–15.5)
Smear Review: NORMAL
WBC: 2.8 10*3/uL — ABNORMAL LOW (ref 4.0–10.5)
nRBC: 7.1 % — ABNORMAL HIGH (ref 0.0–0.2)

## 2021-12-15 LAB — COMPREHENSIVE METABOLIC PANEL
ALT: 43 U/L (ref 0–44)
AST: 60 U/L — ABNORMAL HIGH (ref 15–41)
Albumin: 2.2 g/dL — ABNORMAL LOW (ref 3.5–5.0)
Alkaline Phosphatase: 119 U/L (ref 38–126)
Anion gap: 9 (ref 5–15)
BUN: 17 mg/dL (ref 8–23)
CO2: 22 mmol/L (ref 22–32)
Calcium: 8.3 mg/dL — ABNORMAL LOW (ref 8.9–10.3)
Chloride: 105 mmol/L (ref 98–111)
Creatinine, Ser: 0.93 mg/dL (ref 0.61–1.24)
GFR, Estimated: >60 mL/min (ref >60)
Glucose, Bld: 106 mg/dL — ABNORMAL HIGH (ref 70–99)
Potassium: 4 mmol/L (ref 3.5–5.1)
Sodium: 136 mmol/L (ref 135–145)
Total Bilirubin: 1.8 mg/dL — ABNORMAL HIGH (ref 0.3–1.2)
Total Protein: 5.5 g/dL — ABNORMAL LOW (ref 6.5–8.1)

## 2021-12-15 LAB — MAGNESIUM: Magnesium: 1.8 mg/dL (ref 1.7–2.4)

## 2021-12-15 LAB — BPAM RBC
Blood Product Expiration Date: 202310232359
ISSUE DATE / TIME: 202310201212
Unit Type and Rh: 9500

## 2021-12-15 MED ORDER — IOHEXOL 350 MG/ML SOLN
80.0000 mL | Freq: Once | INTRAVENOUS | Status: AC | PRN
  Administered 2021-12-15: 80 mL via INTRAVENOUS

## 2021-12-15 NOTE — Plan of Care (Signed)
  Problem: Fluid Volume: Goal: Hemodynamic stability will improve Outcome: Progressing   Problem: Clinical Measurements: Goal: Diagnostic test results will improve Outcome: Progressing Goal: Signs and symptoms of infection will decrease Outcome: Progressing   Problem: Respiratory: Goal: Ability to maintain adequate ventilation will improve Outcome: Progressing   Problem: Urinary Elimination: Goal: Signs and symptoms of infection will decrease Outcome: Progressing   Problem: Education: Goal: Knowledge of General Education information will improve Description: Including pain rating scale, medication(s)/side effects and non-pharmacologic comfort measures Outcome: Progressing   Problem: Activity: Goal: Risk for activity intolerance will decrease Outcome: Progressing   Problem: Nutrition: Goal: Adequate nutrition will be maintained Outcome: Progressing   Problem: Coping: Goal: Level of anxiety will decrease Outcome: Progressing   Problem: Pain Managment: Goal: General experience of comfort will improve Outcome: Progressing   Problem: Safety: Goal: Ability to remain free from injury will improve Outcome: Progressing   Problem: Education: Goal: Knowledge of General Education information will improve Description: Including pain rating scale, medication(s)/side effects and non-pharmacologic comfort measures Outcome: Progressing   Problem: Health Behavior/Discharge Planning: Goal: Ability to manage health-related needs will improve Outcome: Progressing   Problem: Clinical Measurements: Goal: Ability to maintain clinical measurements within normal limits will improve Outcome: Progressing Goal: Will remain free from infection Outcome: Progressing Goal: Diagnostic test results will improve Outcome: Progressing Goal: Respiratory complications will improve Outcome: Progressing Goal: Cardiovascular complication will be avoided Outcome: Progressing   Problem:  Activity: Goal: Risk for activity intolerance will decrease Outcome: Progressing   Problem: Nutrition: Goal: Adequate nutrition will be maintained Outcome: Progressing   Problem: Coping: Goal: Level of anxiety will decrease Outcome: Progressing   Problem: Elimination: Goal: Will not experience complications related to bowel motility Outcome: Progressing Goal: Will not experience complications related to urinary retention Outcome: Progressing   Problem: Safety: Goal: Ability to remain free from injury will improve Outcome: Progressing   Problem: Skin Integrity: Goal: Risk for impaired skin integrity will decrease Outcome: Progressing

## 2021-12-15 NOTE — Progress Notes (Signed)
   12/15/21 0437  Provider Notification  Provider Name/Title Dr. Marlowe Sax  Date Provider Notified 12/15/21  Time Provider Notified [phone]  Method of Notification Page  Notification Reason Other (Comment) (To make aware AM labs are in the system.)   Earleen Reaper RN

## 2021-12-15 NOTE — Progress Notes (Signed)
   12/15/21 0113  Provider Notification  Provider Name/Title Dr. Marlowe Sax  Date Provider Notified 12/15/21  Time Provider Notified 0113  Method of Notification Page  Notification Reason Change in status  Provider response Other (Comment) (asked that am labs be ordered stat)  Date of Provider Response 12/15/21   During the night, the patient has had multiple burst of SVT with HR as high as 170s.  VS remained stable and patient is asymptomatic.  He is actually asleep during some of this episodes.  Dr. Marlowe Sax made aware.  Asked lab to draw am labs ASAP to evaluate electrolytes.  Will make Dr. Marlowe Sax aware of when lab results are in the system.  Earleen Reaper RN

## 2021-12-15 NOTE — Progress Notes (Signed)
Physical Therapy Treatment Patient Details Name: Tyler [name] MRN: [id number] DOB: 1950-12-14 Today's Date: 12/15/2021   History of Present Illness 71 y.o. male adm 12/10/21 with sciatica, fever, and anemia 6.2. sepsis 2/2 UTI; MRI spine shows epidural lipomatosis; PMH prostate cancer status post prostatectomy in 2021, history of bilateral pulmonary embolism, essential hypertension    PT Comments    Pt received in bed, daughter and wife present. Pt noted to have several episodes of SVT with HR up to 170 bpm last night. HR remained stable throughout therapy today with low 100's while ambulating and peack of 113 bpm after 10 stairs. Pt supervision level with mobility. No LOB during session. Worked on higher level balance and gait stability. Pt did not experience LE pain during this session. PT will continue to follow.     Recommendations for follow up therapy are one component of a multi-disciplinary discharge planning process, led by the attending physician.  Recommendations may be updated based on patient status, additional functional criteria and insurance authorization.  Follow Up Recommendations  Outpatient PT     Assistance Recommended at Discharge Intermittent Supervision/Assistance  Patient can return home with the following Assist for transportation;Help with stairs or ramp for entrance   Equipment Recommendations  Rolling walker (2 wheels)    Recommendations for Other Services       Precautions / Restrictions Precautions Precautions: Fall Precaution Comments: reports 3 falls with onset of pain Restrictions Weight Bearing Restrictions: No     Mobility  Bed Mobility Overal bed mobility: Independent                  Transfers Overall transfer level: Modified independent Equipment used: None                    Ambulation/Gait Ambulation/Gait assistance: Supervision Gait Distance (Feet): 400 Feet Assistive device: None Gait Pattern/deviations:  Step-through pattern, Decreased step length - right, Decreased step length - left, Wide base of support Gait velocity: slowed Gait velocity interpretation: 1.31 - 2.62 ft/sec, indicative of limited community ambulator   General Gait Details: supervision for safety, pt using rail only for stairs. No LOB. Was able to change pace when cued. HR stable in low 100's, SPO2 in 90's on RA   Stairs Stairs: Yes Stairs assistance: Supervision Stair Management: One rail Right, Alternating pattern, Forwards Number of Stairs: 10 General stair comments: no difficulty with stairs with use of rail, HR peaked at 113 bpm   Wheelchair Mobility    Modified Rankin (Stroke Patients Only)       Balance Overall balance assessment: Mild deficits observed, not formally tested, History of Falls, Modified Independent (lacks higher level balance skills)                                          Cognition Arousal/Alertness: Awake/alert Behavior During Therapy: WFL for tasks assessed/performed Overall Cognitive Status: Within Functional Limits for tasks assessed                                          Exercises      General Comments General comments (skin integrity, edema, etc.): worked on bkwd stepping, side stepping, quick changes in direction and quick stops      Pertinent Vitals/Pain Pain Assessment Pain Assessment:  No/denies pain    Home Living                          Prior Function            PT Goals (current goals can now be found in the care plan section) Acute Rehab PT Goals Patient Stated Goal: return home PT Goal Formulation: With patient/family Time For Goal Achievement: 12/27/21 Potential to Achieve Goals: Good Progress towards PT goals: Progressing toward goals    Frequency    Min 3X/week      PT Plan Current plan remains appropriate    Co-evaluation              AM-PAC PT "6 Clicks" Mobility   Outcome  Measure  Help needed turning from your back to your side while in a flat bed without using bedrails?: None Help needed moving from lying on your back to sitting on the side of a flat bed without using bedrails?: None Help needed moving to and from a bed to a chair (including a wheelchair)?: None Help needed standing up from a chair using your arms (e.g., wheelchair or bedside chair)?: None Help needed to walk in hospital room?: None Help needed climbing 3-5 steps with a railing? : A Little 6 Click Score: 23    End of Session Equipment Utilized During Treatment: Gait belt Activity Tolerance: Patient tolerated treatment well Patient left: in bed;with call bell/phone within reach;with bed alarm set;with family/visitor present Nurse Communication: Mobility status PT Visit Diagnosis: Unsteadiness on feet (R26.81);Muscle weakness (generalized) (M62.81);History of falling (Z91.81);Difficulty in walking, not elsewhere classified (R26.2)     Time: 1245-8099 PT Time Calculation (min) (ACUTE ONLY): 25 min  Charges:  $Gait Training: 23-37 mins                     Rolla chat preferred Office [place] 12/15/2021, 12:47 PM

## 2021-12-15 NOTE — Progress Notes (Signed)
PROGRESS NOTE    Tyler [name]  UXN:[id number] DOB: [birth year] DOA: 12/10/2021 PCP: [name], FNP   Brief Narrative:  71 y.o. male with a past medical history of prostate cancer status post prostatectomy in 2021, history of bilateral pulmonary embolism after his surgery in 2021, essential hypertension presented with worsening back pain radiating down to right leg along with fevers and hematuria.  On presentation, his hemoglobin was 6.3.  He was started on broad-spectrum antibiotics for possible UTI.  Subsequently, oncology was consulted for pancytopenia and neurosurgery was consulted for lumbosacral stenosis and severe lower back pain.  Assessment & Plan:   Sepsis: Present on admission Possible UTI: Present on admission -Presented with positive nitrites in UA but no pyuria and only a few bacteria.  Blood and urine cultures negative so far -Currently on cefepime.  Still spiking temperatures.  Vancomycin discontinued on 12/12/2021.  No temperature spikes over the last 48 hours. -Has significantly elevated CRP and has pancytopenia.  Unclear if the fever is from an underlying hematological process -CT of abdomen and pelvis was negative for any acute findings. -MRI of lumbar spine did not show signs of osteomyelitis -will get 2D echo to investigate for the cause of intermittent fever.  Might also need imaging of chest.  Pancytopenia with symptomatic anemia -Status post 3 units packed red cell transfusion during this hospitalization, most recent being on 12/14/2021.  Hemoglobin 8.3 this morning.  Monitor H&H.  No overt signs of bleeding. -Oncology following.  Status post bone marrow biopsy by IR on 12/14/2021.  Follow pathology. -Normal vitamin B12 and folate levels. -No overt signs of bleeding.  Platelets 39 today.  Abdominal pain -Lipase normal.  LFTs almost unremarkable.  CT of abdomen and pelvis did not show any acute findings.  Continue PPI for now.  Abdominal pain  improving.  Severe lower back pain radiating to her right lower extremity Lumbosacral stenosis -Still having intermittent significant lower back pain radiating to right lower extremity.  MRI of lumbar spine showed moderate to severe thecal sac narrowing posterior to L5 and severe thecal sac narrowing of L5-S1.   -Neurosurgery evaluation appreciated: Patient is not an operative candidate at this time.  Neurosurgery does not believe that patient has severe lumbosacral stenosis and it is unlikely that epidural lipomatosis is responsible for such severe pain. -PT recommends outpatient PT -Continue as needed Tylenol.  Add Lidoderm patch and as needed tramadol.  History of bilateral PE in 2021 -He was on Eliquis up until 2 to 3 months ago when it was discontinued by his urologist.  It was a provoked PE after his prostatectomy in 2021.  Alcohol abuse -Heavy consumption in the last 2 to 3 months per patient's daughter.  No evidence of withdrawal currently.  Continue CIWA protocol.  Continue thiamine, multivitamins and folic acid.  History of prostate cancer status post prostatectomy in 2021 -Followed by Dr. [name] with urology.  PSA was undetectable earlier this month.  Results available in care everywhere.  Obesity -Outpatient follow-up  DVT prophylaxis: SCDs Code Status: Full Family Communication: Wife at bedside Disposition Plan: Status is: Inpatient Remains inpatient appropriate because: Of severity of illness.  Consultants: Oncology  Procedures: None  Antimicrobials: Cefepime from 12/10/2021 onwards.  Vancomycin 12/10/2021-12/11/2021   Subjective: Patient seen and examined at bedside.  Poor historian.  Still complains of intermittent lower back pain going down to his right leg.  No worsening abdominal pain, vomiting or shortness of breath reported.   Objective: Vitals:   12/14/21  2213 12/15/21 0045 12/15/21 0225 12/15/21 0540  BP: 125/79 (!) 145/90  136/81  Pulse: 94 87  99   Resp:  19  17  Temp: 98.4 F (36.9 C) 98 F (36.7 C)  (!) 100.9 F (38.3 C)  TempSrc:  Oral  Oral  SpO2: 100% 100%  100%  Weight:   83.6 kg   Height:        Intake/Output Summary (Last 24 hours) at 12/15/2021 0807 Last data filed at 12/15/2021 0610 Gross per 24 hour  Intake 1664.91 ml  Output 0 ml  Net 1664.91 ml    Filed Weights   12/14/21 0530 12/14/21 2200 12/15/21 0225  Weight: 84.8 kg 83.6 kg 83.6 kg    Examination:  General: On room air currently.  No distress.  Looks chronically ill and deconditioned. ENT/neck: No obvious palpable thyromegaly or JVD elevation noted respiratory: Breath sounds at bases bilaterally, scattered crackles  CVS: S1-S2 heard; rate mostly controlled  abdominal: Soft, nontender, still slightly distended; no organomegaly, normal bowel sounds heard  extremities: Trace lower extremity edema present; no cyanosis  CNS: Alert and awake.  No focal neurologic deficit.  Moving extremities Lymph: No cervical lymphadenopathy palpable  skin: No obvious ecchymosis/lesions  psych: Currently not agitated.  Affect is mostly flat Musculoskeletal: No obvious joint erythema/swelling   Data Reviewed: I have personally reviewed following labs and imaging studies  CBC: Recent Labs  Lab 12/11/21 0500 12/11/21 1123 12/12/21 0254 12/13/21 0209 12/14/21 0421 12/14/21 1014 12/15/21 0305  WBC 2.8*  --  3.1* 2.6* 2.3*  --  2.8*  NEUTROABS 1.8  --  1.8 1.5* 1.2*  --  1.6*  HGB 7.4*  --  8.4* 7.3* 6.8* 7.1* 8.3*  HCT 22.9*  --  26.0* 22.4* 21.9* 21.9* 25.1*  MCV 87.4  --  87.8 88.2 90.5  --  87.8  PLT 63* 66* 53* 43* 43*  --  39*    Basic Metabolic Panel: Recent Labs  Lab 12/10/21 2331 12/11/21 0500 12/12/21 0254 12/13/21 0209 12/14/21 0421 12/15/21 0305  NA  --  136 137 135 135 136  K  --  4.4 4.3 4.2 3.9 4.0  CL  --  106 105 106 105 105  CO2  --  21* 21* 20* 19* 22  GLUCOSE  --  103* 99 150* 117* 106*  BUN  --  11 10 13 13 17   CREATININE  --   0.97 1.03 1.16 0.99 0.93  CALCIUM  --  8.0* 8.8* 8.0* 8.2* 8.3*  MG 1.9 1.7  --  1.9 1.7 1.8    GFR: Estimated Creatinine Clearance: 77.5 mL/min (by C-G formula based on SCr of 0.93 mg/dL). Liver Function Tests: Recent Labs  Lab 12/11/21 0500 12/11/21 1123 12/12/21 0254 12/13/21 0209 12/14/21 0421 12/15/21 0305  AST 26  --  29 30 38 60*  ALT 22  --  25 22 26  43  ALKPHOS 87  --  97 107 119 119  BILITOT 1.6* 1.9* 1.9* 1.2 1.5* 1.8*  PROT 5.2*  --  5.9* 5.2* 5.1* 5.5*  ALBUMIN 2.1*  --  2.3* 2.0* 2.0* 2.2*    Recent Labs  Lab 12/10/21 1219  LIPASE 36    No results for input(s): "AMMONIA" in the last 168 hours. Coagulation Profile: Recent Labs  Lab 12/11/21 0500 12/11/21 1123  INR 1.4* 1.4*    Cardiac Enzymes: Recent Labs  Lab 12/10/21 1416  CKTOTAL 25*    BNP (last 3 results) No results  for input(s): "PROBNP" in the last 8760 hours. HbA1C: No results for input(s): "HGBA1C" in the last 72 hours. CBG: No results for input(s): "GLUCAP" in the last 168 hours. Lipid Profile: No results for input(s): "CHOL", "HDL", "LDLCALC", "TRIG", "CHOLHDL", "LDLDIRECT" in the last 72 hours. Thyroid Function Tests: No results for input(s): "TSH", "T4TOTAL", "FREET4", "T3FREE", "THYROIDAB" in the last 72 hours.  Anemia Panel: No results for input(s): "VITAMINB12", "FOLATE", "FERRITIN", "TIBC", "IRON", "RETICCTPCT" in the last 72 hours.  Sepsis Labs: Recent Labs  Lab 12/10/21 1226 12/10/21 1416 12/10/21 2330 12/11/21 0500 12/12/21 0254 12/13/21 0209  PROCALCITON  --   --   --  0.20 0.16 0.24  LATICACIDVEN 5.0* 3.7* 1.8  --   --   --      Recent Results (from the past 240 hour(s))  Resp Panel by RT-PCR (Flu A&B, Covid) Anterior Nasal Swab     Status: None   Collection Time: 12/10/21 12:16 PM   Specimen: Anterior Nasal Swab  Result Value Ref Range Status   SARS Coronavirus 2 by RT PCR NEGATIVE NEGATIVE Final    Comment: (NOTE) SARS-CoV-2 target nucleic acids are  NOT DETECTED.  The SARS-CoV-2 RNA is generally detectable in upper respiratory specimens during the acute phase of infection. The lowest concentration of SARS-CoV-2 viral copies this assay can detect is 138 copies/mL. A negative result does not preclude SARS-Cov-2 infection and should not be used as the sole basis for treatment or other patient management decisions. A negative result may occur with  improper specimen collection/handling, submission of specimen other than nasopharyngeal swab, presence of viral mutation(s) within the areas targeted by this assay, and inadequate number of viral copies(<138 copies/mL). A negative result must be combined with clinical observations, patient history, and epidemiological information. The expected result is Negative.  Fact Sheet for Patients:  EntrepreneurPulse.com.au  Fact Sheet for Healthcare Providers:  IncredibleEmployment.be  This test is no t yet approved or cleared by the [place] FDA and  has been authorized for detection and/or diagnosis of SARS-CoV-2 by FDA under an Emergency Use Authorization (EUA). This EUA will remain  in effect (meaning this test can be used) for the duration of the COVID-19 declaration under Section 564(b)(1) of the Act, 21 U.S.C.section 360bbb-3(b)(1), unless the authorization is terminated  or revoked sooner.       Influenza A by PCR NEGATIVE NEGATIVE Final   Influenza B by PCR NEGATIVE NEGATIVE Final    Comment: (NOTE) The Xpert Xpress SARS-CoV-2/FLU/RSV plus assay is intended as an aid in the diagnosis of influenza from Nasopharyngeal swab specimens and should not be used as a sole basis for treatment. Nasal washings and aspirates are unacceptable for Xpert Xpress SARS-CoV-2/FLU/RSV testing.  Fact Sheet for Patients: EntrepreneurPulse.com.au  Fact Sheet for Healthcare Providers: IncredibleEmployment.be  This test is not  yet approved or cleared by the [place] FDA and has been authorized for detection and/or diagnosis of SARS-CoV-2 by FDA under an Emergency Use Authorization (EUA). This EUA will remain in effect (meaning this test can be used) for the duration of the COVID-19 declaration under Section 564(b)(1) of the Act, 21 U.S.C. section 360bbb-3(b)(1), unless the authorization is terminated or revoked.  Performed at [facility], [place] [address] [place], [place] [address]   Urine Culture     Status: None   Collection Time: 12/11/21  6:55 AM   Specimen: Urine, Clean Catch  Result Value Ref Range Status   Specimen Description URINE, CLEAN CATCH  Final  Special Requests NONE  Final   Culture   Final    NO GROWTH Performed at [facility], [place] [address], [place], [place] [address]    Report Status 12/12/2021 FINAL  Final  Culture, blood (Routine X 2) w Reflex to ID Panel     Status: None (Preliminary result)   Collection Time: 12/11/21  6:55 AM   Specimen: BLOOD RIGHT HAND  Result Value Ref Range Status   Specimen Description BLOOD RIGHT HAND  Final   Special Requests   Final    BOTTLES DRAWN AEROBIC AND ANAEROBIC Blood Culture adequate volume   Culture   Final    NO GROWTH 3 DAYS Performed at [facility], [place] [address] [place], [place] [address]    Report Status PENDING  Incomplete  Culture, blood (Routine X 2) w Reflex to ID Panel     Status: None (Preliminary result)   Collection Time: 12/11/21  6:02 PM   Specimen: BLOOD  Result Value Ref Range Status   Specimen Description BLOOD LEFT ANTECUBITAL  Final   Special Requests   Final    BOTTLES DRAWN AEROBIC AND ANAEROBIC Blood Culture adequate volume   Culture   Final    NO GROWTH 3 DAYS Performed at [facility], [place] [address] [place], [place] [address]    Report Status PENDING  Incomplete         Radiology Studies: CT BONE MARROW BIOPSY & ASPIRATION  Result Date: 12/14/2021 INDICATION:  Pancytopenia EXAM: CT BONE MARROW BIOPSY AND ASPIRATION MEDICATIONS: None. ANESTHESIA/SEDATION: Moderate (conscious) sedation was employed during this procedure. A total of Versed 1.5 mg and Fentanyl 75 mcg was administered intravenously. Moderate Sedation Time: 11 minutes. The patient's level of consciousness and vital signs were monitored continuously by radiology nursing throughout the procedure under my direct supervision. FLUOROSCOPY TIME:  N/a COMPLICATIONS: None immediate. PROCEDURE: Informed written consent was obtained from the patient after a thorough discussion of the procedural risks, benefits and alternatives. All questions were addressed. Maximal Sterile Barrier Technique was utilized including caps, mask, sterile gowns, sterile gloves, sterile drape, hand hygiene and skin antiseptic. A timeout was performed prior to the initiation of the procedure. The patient was placed prone on the CT exam table. Limited CT of the pelvis was performed for planning purposes. Skin entry site was marked, and the overlying skin was prepped and draped in the standard sterile fashion. Local analgesia was obtained with 1% lidocaine. Using CT guidance, an 11 gauge needle was advanced just deep to the cortex of the right posterior ilium. Subsequently, bone marrow aspiration and core biopsy were performed. Specimens were submitted to lab/pathology for handling. Hemostasis was achieved with manual pressure, and a clean dressing was placed. The patient tolerated the procedure well without immediate complication. IMPRESSION: Successful CT-guided bone marrow aspiration and core biopsy of the right posterior ilium. Electronically Signed   By: Albin Felling M.D.   On: 12/14/2021 10:24        Scheduled Meds:  B-complex with vitamin C  1 tablet Oral Daily   feeding supplement  237 mL Oral BID BM   folic acid  1 mg Oral Daily   multivitamin with minerals  1 tablet Oral Daily   pantoprazole (PROTONIX) IV  40 mg Intravenous  Q12H   senna-docusate  1 tablet Oral BID   thiamine  100 mg Oral Daily   Continuous Infusions:  ceFEPime (MAXIPIME) IV 2 g (12/15/21 0603)  Aline August, MD [facility] 12/15/2021, 8:07 AM

## 2021-12-16 ENCOUNTER — Inpatient Hospital Stay (HOSPITAL_COMMUNITY): Payer: 59

## 2021-12-16 DIAGNOSIS — R509 Fever, unspecified: Secondary | ICD-10-CM | POA: Diagnosis not present

## 2021-12-16 DIAGNOSIS — D649 Anemia, unspecified: Secondary | ICD-10-CM | POA: Diagnosis not present

## 2021-12-16 DIAGNOSIS — D696 Thrombocytopenia, unspecified: Secondary | ICD-10-CM | POA: Diagnosis not present

## 2021-12-16 DIAGNOSIS — N390 Urinary tract infection, site not specified: Secondary | ICD-10-CM | POA: Diagnosis not present

## 2021-12-16 LAB — CBC WITH DIFFERENTIAL/PLATELET
Abs Immature Granulocytes: 0.02 10*3/uL (ref 0.00–0.07)
Basophils Absolute: 0 10*3/uL (ref 0.0–0.1)
Basophils Relative: 1 %
Eosinophils Absolute: 0 10*3/uL (ref 0.0–0.5)
Eosinophils Relative: 0 %
HCT: 21.7 % — ABNORMAL LOW (ref 39.0–52.0)
Hemoglobin: 7.2 g/dL — ABNORMAL LOW (ref 13.0–17.0)
Immature Granulocytes: 1 %
Lymphocytes Relative: 34 %
Lymphs Abs: 0.7 10*3/uL (ref 0.7–4.0)
MCH: 29.4 pg (ref 26.0–34.0)
MCHC: 33.2 g/dL (ref 30.0–36.0)
MCV: 88.6 fL (ref 80.0–100.0)
Monocytes Absolute: 0.2 10*3/uL (ref 0.1–1.0)
Monocytes Relative: 11 %
Neutro Abs: 1.2 10*3/uL — ABNORMAL LOW (ref 1.7–7.7)
Neutrophils Relative %: 53 %
Platelets: 35 10*3/uL — ABNORMAL LOW (ref 150–400)
RBC: 2.45 MIL/uL — ABNORMAL LOW (ref 4.22–5.81)
RDW: 20.8 % — ABNORMAL HIGH (ref 11.5–15.5)
Smear Review: DECREASED
WBC: 2.2 10*3/uL — ABNORMAL LOW (ref 4.0–10.5)
nRBC: 5.9 % — ABNORMAL HIGH (ref 0.0–0.2)

## 2021-12-16 LAB — COMPREHENSIVE METABOLIC PANEL
ALT: 36 U/L (ref 0–44)
AST: 39 U/L (ref 15–41)
Albumin: 2 g/dL — ABNORMAL LOW (ref 3.5–5.0)
Alkaline Phosphatase: 107 U/L (ref 38–126)
Anion gap: 13 (ref 5–15)
BUN: 16 mg/dL (ref 8–23)
CO2: 20 mmol/L — ABNORMAL LOW (ref 22–32)
Calcium: 8.2 mg/dL — ABNORMAL LOW (ref 8.9–10.3)
Chloride: 101 mmol/L (ref 98–111)
Creatinine, Ser: 0.99 mg/dL (ref 0.61–1.24)
GFR, Estimated: >60 mL/min (ref >60)
Glucose, Bld: 110 mg/dL — ABNORMAL HIGH (ref 70–99)
Potassium: 4 mmol/L (ref 3.5–5.1)
Sodium: 134 mmol/L — ABNORMAL LOW (ref 135–145)
Total Bilirubin: 1.6 mg/dL — ABNORMAL HIGH (ref 0.3–1.2)
Total Protein: 5.1 g/dL — ABNORMAL LOW (ref 6.5–8.1)

## 2021-12-16 LAB — ECHOCARDIOGRAM COMPLETE
AR max vel: 2.43 cm2
AV Area VTI: 2.52 cm2
AV Area mean vel: 2.92 cm2
AV Mean grad: 4 mmHg
AV Peak grad: 10 mmHg
Ao pk vel: 1.58 m/s
Area-P 1/2: 4.06 cm2
Height: 67 in
S' Lateral: 1.8 cm
Weight: 2924.18 oz

## 2021-12-16 LAB — CULTURE, BLOOD (ROUTINE X 2)
Culture: NO GROWTH
Culture: NO GROWTH
Special Requests: ADEQUATE
Special Requests: ADEQUATE

## 2021-12-16 LAB — MAGNESIUM: Magnesium: 1.7 mg/dL (ref 1.7–2.4)

## 2021-12-16 MED ORDER — LIDOCAINE 5 % EX PTCH
1.0000 | MEDICATED_PATCH | CUTANEOUS | Status: DC
  Administered 2021-12-16 – 2021-12-20 (×5): 1 via TRANSDERMAL
  Filled 2021-12-16 (×5): qty 1

## 2021-12-16 MED ORDER — TRAMADOL HCL 50 MG PO TABS
50.0000 mg | ORAL_TABLET | Freq: Four times a day (QID) | ORAL | Status: DC | PRN
  Administered 2021-12-16: 50 mg via ORAL
  Filled 2021-12-16: qty 1

## 2021-12-16 MED ORDER — PERFLUTREN LIPID MICROSPHERE
1.0000 mL | INTRAVENOUS | Status: AC | PRN
  Administered 2021-12-16: 2 mL via INTRAVENOUS

## 2021-12-16 NOTE — Plan of Care (Signed)
  Problem: Fluid Volume: Goal: Hemodynamic stability will improve Outcome: Progressing   Problem: Clinical Measurements: Goal: Diagnostic test results will improve Outcome: Progressing Goal: Signs and symptoms of infection will decrease Outcome: Progressing   Problem: Respiratory: Goal: Ability to maintain adequate ventilation will improve Outcome: Progressing   Problem: Urinary Elimination: Goal: Signs and symptoms of infection will decrease Outcome: Progressing   Problem: Education: Goal: Knowledge of General Education information will improve Description: Including pain rating scale, medication(s)/side effects and non-pharmacologic comfort measures Outcome: Progressing   Problem: Nutrition: Goal: Adequate nutrition will be maintained Outcome: Progressing   Problem: Coping: Goal: Level of anxiety will decrease Outcome: Progressing   Problem: Pain Managment: Goal: General experience of comfort will improve Outcome: Progressing   Problem: Safety: Goal: Ability to remain free from injury will improve Outcome: Progressing   Problem: Education: Goal: Knowledge of General Education information will improve Description: Including pain rating scale, medication(s)/side effects and non-pharmacologic comfort measures Outcome: Progressing   Problem: Health Behavior/Discharge Planning: Goal: Ability to manage health-related needs will improve Outcome: Progressing   Problem: Health Behavior/Discharge Planning: Goal: Ability to manage health-related needs will improve Outcome: Progressing   Problem: Clinical Measurements: Goal: Ability to maintain clinical measurements within normal limits will improve Outcome: Progressing Goal: Will remain free from infection Outcome: Progressing Goal: Diagnostic test results will improve Outcome: Progressing Goal: Respiratory complications will improve Outcome: Progressing Goal: Cardiovascular complication will be avoided Outcome:  Progressing   Problem: Activity: Goal: Risk for activity intolerance will decrease Outcome: Progressing   Problem: Nutrition: Goal: Adequate nutrition will be maintained Outcome: Progressing   Problem: Coping: Goal: Level of anxiety will decrease Outcome: Progressing   Problem: Elimination: Goal: Will not experience complications related to bowel motility Outcome: Progressing Goal: Will not experience complications related to urinary retention Outcome: Progressing   Problem: Pain Managment: Goal: General experience of comfort will improve Outcome: Progressing   Problem: Safety: Goal: Ability to remain free from injury will improve Outcome: Progressing   Problem: Skin Integrity: Goal: Risk for impaired skin integrity will decrease Outcome: Progressing

## 2021-12-16 NOTE — Progress Notes (Signed)
PROGRESS NOTE    Tyler [name]  WLN:[id number] DOB: Mar 07, 1950 DOA: 12/10/2021 PCP: [name], FNP   Brief Narrative:  71 y.o. male with a past medical history of prostate cancer status post prostatectomy in 2021, history of bilateral pulmonary embolism after his surgery in 2021, essential hypertension presented with worsening back pain radiating down to right leg along with fevers and hematuria.  On presentation, his hemoglobin was 6.3.  He was started on broad-spectrum antibiotics for possible UTI.  Subsequently, oncology was consulted for pancytopenia and neurosurgery was consulted for lumbosacral stenosis and severe lower back pain.  He underwent bone marrow biopsy by IR on 12/14/2021.  Assessment & Plan:   Sepsis: Present on admission Possible UTI: Present on admission -Presented with positive nitrites in UA but no pyuria and only a few bacteria.  Blood and urine cultures negative so far -Currently on cefepime today day #7 of antibiotics.  DC after today's doses..  No temperature spikes over the last 24 hours.  Vancomycin discontinued on 12/12/2021.   -Has significantly elevated CRP and has pancytopenia.  Unclear if the fever is from an underlying hematological process -CT of abdomen and pelvis was negative for any acute findings. -MRI of lumbar spine did not show signs of osteomyelitis -2D echo pending.  CTA chest was negative for PE or any other acute abnormality.  Pancytopenia with symptomatic anemia -Status post 3 units packed red cell transfusion during this hospitalization, most recent being on 12/14/2021.  Hemoglobin 7.2 this morning.  Monitor H&H.  No overt signs of bleeding. -Oncology following.  Status post bone marrow biopsy by IR on 12/14/2021.  Follow pathology. -Normal vitamin B12 and folate levels. -No overt signs of bleeding.  Platelets 35 today.  Abdominal pain -Lipase normal.  LFTs almost unremarkable.  CT of abdomen and pelvis did not show any acute  findings.  Continue PPI for now.  Abdominal pain improving.  Severe lower back pain radiating to her right lower extremity Lumbosacral stenosis -Still having intermittent significant lower back pain radiating to right lower extremity.  MRI of lumbar spine showed moderate to severe thecal sac narrowing posterior to L5 and severe thecal sac narrowing of L5-S1.   -Neurosurgery evaluation appreciated: Patient is not an operative candidate at this time.  Neurosurgery does not believe that patient has severe lumbosacral stenosis and it is unlikely that epidural lipomatosis is responsible for such severe pain. -PT recommends outpatient PT -Continue as needed Tylenol.  Add Lidoderm patch and as needed tramadol.  History of bilateral PE in 2021 -He was on Eliquis up until 2 to 3 months ago when it was discontinued by his urologist.  It was a provoked PE after his prostatectomy in 2021.  Alcohol abuse -Heavy consumption in the last 2 to 3 months per patient's daughter.  No evidence of withdrawal currently.  Continue CIWA protocol.  Continue thiamine, multivitamins and folic acid.  History of prostate cancer status post prostatectomy in 2021 -Followed by Dr. [name] with urology.  PSA was undetectable earlier this month.  Results available in care everywhere.  Obesity -Outpatient follow-up  DVT prophylaxis: SCDs Code Status: Full Family Communication: Daughter at bedside Disposition Plan: Status is: Inpatient Remains inpatient appropriate because: Of severity of illness.  Consultants: Oncology  Procedures: None  Antimicrobials: Cefepime from 12/10/2021 onwards.  Vancomycin 12/10/2021-12/11/2021   Subjective: Patient seen and examined at bedside.  Poor historian.  No fever, chest pain, worsening shortness of breath reported.  Still complains of intermittent lower back pain  radiating down the right leg.   Objective: Vitals:   12/15/21 1658 12/15/21 2059 12/16/21 0500 12/16/21 0505  BP: (!)  143/77 128/77  123/71  Pulse: 100 (!) 101  97  Resp: 17 17    Temp: 97.9 F (36.6 C) 98.5 F (36.9 C)  98.5 F (36.9 C)  TempSrc:  Oral    SpO2: 98% 98%  97%  Weight:   82.9 kg   Height:        Intake/Output Summary (Last 24 hours) at 12/16/2021 0749 Last data filed at 12/15/2021 1700 Gross per 24 hour  Intake 880 ml  Output --  Net 880 ml    Filed Weights   12/14/21 2200 12/15/21 0225 12/16/21 0500  Weight: 83.6 kg 83.6 kg 82.9 kg    Examination:  General: No acute distress.  Still on room air.  Looks chronically ill and deconditioned. ENT/neck: No obvious JVD elevation or neck masses palpable  respiratory: Decreased breath sounds at bases bilaterally, some crackles CVS: Currently rate controlled; S1 and S2 are heard abdominal: Soft, nontender, still showing signs of distention; bowel sounds heard; no organomegaly noted extremities: No clubbing; mild lower extremity edema present  CNS: Awake and alert.  Slow to respond.  No focal neurologic deficit.  Able to move extremities.   Lymph: No obvious lymphadenopathy palpable skin: No rashes/petechiae  psych: Affect is still flat.  Not agitated currently.   Musculoskeletal: No obvious joint tenderness/erythema  Data Reviewed: I have personally reviewed following labs and imaging studies  CBC: Recent Labs  Lab 12/12/21 0254 12/13/21 0209 12/14/21 0421 12/14/21 1014 12/15/21 0305 12/16/21 0220  WBC 3.1* 2.6* 2.3*  --  2.8* 2.2*  NEUTROABS 1.8 1.5* 1.2*  --  1.6* 1.2*  HGB 8.4* 7.3* 6.8* 7.1* 8.3* 7.2*  HCT 26.0* 22.4* 21.9* 21.9* 25.1* 21.7*  MCV 87.8 88.2 90.5  --  87.8 88.6  PLT 53* 43* 43*  --  39* 35*    Basic Metabolic Panel: Recent Labs  Lab 12/11/21 0500 12/12/21 0254 12/13/21 0209 12/14/21 0421 12/15/21 0305 12/16/21 0220  NA 136 137 135 135 136 134*  K 4.4 4.3 4.2 3.9 4.0 4.0  CL 106 105 106 105 105 101  CO2 21* 21* 20* 19* 22 20*  GLUCOSE 103* 99 150* 117* 106* 110*  BUN 11 10 13 13 17 16    CREATININE 0.97 1.03 1.16 0.99 0.93 0.99  CALCIUM 8.0* 8.8* 8.0* 8.2* 8.3* 8.2*  MG 1.7  --  1.9 1.7 1.8 1.7    GFR: Estimated Creatinine Clearance: 72.5 mL/min (by C-G formula based on SCr of 0.99 mg/dL). Liver Function Tests: Recent Labs  Lab 12/12/21 0254 12/13/21 0209 12/14/21 0421 12/15/21 0305 12/16/21 0220  AST 29 30 38 60* 39  ALT 25 22 26  43 36  ALKPHOS 97 107 119 119 107  BILITOT 1.9* 1.2 1.5* 1.8* 1.6*  PROT 5.9* 5.2* 5.1* 5.5* 5.1*  ALBUMIN 2.3* 2.0* 2.0* 2.2* 2.0*    Recent Labs  Lab 12/10/21 1219  LIPASE 36    No results for input(s): "AMMONIA" in the last 168 hours. Coagulation Profile: Recent Labs  Lab 12/11/21 0500 12/11/21 1123  INR 1.4* 1.4*    Cardiac Enzymes: Recent Labs  Lab 12/10/21 1416  CKTOTAL 25*    BNP (last 3 results) No results for input(s): "PROBNP" in the last 8760 hours. HbA1C: No results for input(s): "HGBA1C" in the last 72 hours. CBG: No results for input(s): "GLUCAP" in the last 168  hours. Lipid Profile: No results for input(s): "CHOL", "HDL", "LDLCALC", "TRIG", "CHOLHDL", "LDLDIRECT" in the last 72 hours. Thyroid Function Tests: No results for input(s): "TSH", "T4TOTAL", "FREET4", "T3FREE", "THYROIDAB" in the last 72 hours.  Anemia Panel: No results for input(s): "VITAMINB12", "FOLATE", "FERRITIN", "TIBC", "IRON", "RETICCTPCT" in the last 72 hours.  Sepsis Labs: Recent Labs  Lab 12/10/21 1226 12/10/21 1416 12/10/21 2330 12/11/21 0500 12/12/21 0254 12/13/21 0209  PROCALCITON  --   --   --  0.20 0.16 0.24  LATICACIDVEN 5.0* 3.7* 1.8  --   --   --      Recent Results (from the past 240 hour(s))  Resp Panel by RT-PCR (Flu A&B, Covid) Anterior Nasal Swab     Status: None   Collection Time: 12/10/21 12:16 PM   Specimen: Anterior Nasal Swab  Result Value Ref Range Status   SARS Coronavirus 2 by RT PCR NEGATIVE NEGATIVE Final    Comment: (NOTE) SARS-CoV-2 target nucleic acids are NOT DETECTED.  The  SARS-CoV-2 RNA is generally detectable in upper respiratory specimens during the acute phase of infection. The lowest concentration of SARS-CoV-2 viral copies this assay can detect is 138 copies/mL. A negative result does not preclude SARS-Cov-2 infection and should not be used as the sole basis for treatment or other patient management decisions. A negative result may occur with  improper specimen collection/handling, submission of specimen other than nasopharyngeal swab, presence of viral mutation(s) within the areas targeted by this assay, and inadequate number of viral copies(<138 copies/mL). A negative result must be combined with clinical observations, patient history, and epidemiological information. The expected result is Negative.  Fact Sheet for Patients:  EntrepreneurPulse.com.au  Fact Sheet for Healthcare Providers:  IncredibleEmployment.be  This test is no t yet approved or cleared by the [place] FDA and  has been authorized for detection and/or diagnosis of SARS-CoV-2 by FDA under an Emergency Use Authorization (EUA). This EUA will remain  in effect (meaning this test can be used) for the duration of the COVID-19 declaration under Section 564(b)(1) of the Act, 21 U.S.C.section 360bbb-3(b)(1), unless the authorization is terminated  or revoked sooner.       Influenza A by PCR NEGATIVE NEGATIVE Final   Influenza B by PCR NEGATIVE NEGATIVE Final    Comment: (NOTE) The Xpert Xpress SARS-CoV-2/FLU/RSV plus assay is intended as an aid in the diagnosis of influenza from Nasopharyngeal swab specimens and should not be used as a sole basis for treatment. Nasal washings and aspirates are unacceptable for Xpert Xpress SARS-CoV-2/FLU/RSV testing.  Fact Sheet for Patients: EntrepreneurPulse.com.au  Fact Sheet for Healthcare Providers: IncredibleEmployment.be  This test is not yet approved or  cleared by the [place] FDA and has been authorized for detection and/or diagnosis of SARS-CoV-2 by FDA under an Emergency Use Authorization (EUA). This EUA will remain in effect (meaning this test can be used) for the duration of the COVID-19 declaration under Section 564(b)(1) of the Act, 21 U.S.C. section 360bbb-3(b)(1), unless the authorization is terminated or revoked.  Performed at [facility], [place] [address] [place], [place] [address]   Urine Culture     Status: None   Collection Time: 12/11/21  6:55 AM   Specimen: Urine, Clean Catch  Result Value Ref Range Status   Specimen Description URINE, CLEAN CATCH  Final   Special Requests NONE  Final   Culture   Final    NO GROWTH Performed at [facility], [place],  [place] [address]    Report Status 12/12/2021 FINAL  Final  Culture, blood (Routine X 2) w Reflex to ID Panel     Status: None   Collection Time: 12/11/21  6:55 AM   Specimen: BLOOD RIGHT HAND  Result Value Ref Range Status   Specimen Description BLOOD RIGHT HAND  Final   Special Requests   Final    BOTTLES DRAWN AEROBIC AND ANAEROBIC Blood Culture adequate volume   Culture   Final    NO GROWTH 5 DAYS Performed at [facility], [place] [address] [place], [place] [address]    Report Status 12/16/2021 FINAL  Final  Culture, blood (Routine X 2) w Reflex to ID Panel     Status: None   Collection Time: 12/11/21  6:02 PM   Specimen: BLOOD  Result Value Ref Range Status   Specimen Description BLOOD LEFT ANTECUBITAL  Final   Special Requests   Final    BOTTLES DRAWN AEROBIC AND ANAEROBIC Blood Culture adequate volume   Culture   Final    NO GROWTH 5 DAYS Performed at [facility], [place] [address], [place], [place] [address]    Report Status 12/16/2021 FINAL  Final         Radiology Studies: CT Angio Chest Pulmonary Embolism (PE) W or WO Contrast  Result Date: 12/15/2021 CLINICAL DATA:  Pulmonary embolism (PE)  suspected, high prob EXAM: CT ANGIOGRAPHY CHEST WITH CONTRAST TECHNIQUE: Multidetector CT imaging of the chest was performed using the standard protocol during bolus administration of intravenous contrast. Multiplanar CT image reconstructions and MIPs were obtained to evaluate the vascular anatomy. RADIATION DOSE REDUCTION: This exam was performed according to the departmental dose-optimization program which includes automated exposure control, adjustment of the mA and/or kV according to patient size and/or use of iterative reconstruction technique. CONTRAST:  43m OMNIPAQUE IOHEXOL 350 MG/ML SOLN COMPARISON:  X-ray 12/10/2021.  CT 09/05/2019 FINDINGS: Cardiovascular: Satisfactory opacification of the pulmonary arteries to the segmental level. No evidence of pulmonary embolism. Thoracic aorta is nonaneurysmal. Normal heart size. No pericardial effusion. Mediastinum/Nodes: No enlarged mediastinal, hilar, or axillary lymph nodes. Thyroid gland, trachea, and esophagus demonstrate no significant findings. Lungs/Pleura: No airspace consolidation. No pleural effusion or pneumothorax. Upper Abdomen: No acute abnormality. Musculoskeletal: No chest wall abnormality. No acute or significant osseous findings. Review of the MIP images confirms the above findings. IMPRESSION: No evidence of pulmonary embolism or other acute intrathoracic findings. Electronically Signed   By: NDavina PokeD.O.   On: 12/15/2021 16:21   CT BONE MARROW BIOPSY & ASPIRATION  Result Date: 12/14/2021 INDICATION: Pancytopenia EXAM: CT BONE MARROW BIOPSY AND ASPIRATION MEDICATIONS: None. ANESTHESIA/SEDATION: Moderate (conscious) sedation was employed during this procedure. A total of Versed 1.5 mg and Fentanyl 75 mcg was administered intravenously. Moderate Sedation Time: 11 minutes. The patient's level of consciousness and vital signs were monitored continuously by radiology nursing throughout the procedure under my direct supervision.  FLUOROSCOPY TIME:  N/a COMPLICATIONS: None immediate. PROCEDURE: Informed written consent was obtained from the patient after a thorough discussion of the procedural risks, benefits and alternatives. All questions were addressed. Maximal Sterile Barrier Technique was utilized including caps, mask, sterile gowns, sterile gloves, sterile drape, hand hygiene and skin antiseptic. A timeout was performed prior to the initiation of the procedure. The patient was placed prone on the CT exam table. Limited CT of the pelvis was performed for planning purposes. Skin entry site was marked, and the overlying skin was prepped and  draped in the standard sterile fashion. Local analgesia was obtained with 1% lidocaine. Using CT guidance, an 11 gauge needle was advanced just deep to the cortex of the right posterior ilium. Subsequently, bone marrow aspiration and core biopsy were performed. Specimens were submitted to lab/pathology for handling. Hemostasis was achieved with manual pressure, and a clean dressing was placed. The patient tolerated the procedure well without immediate complication. IMPRESSION: Successful CT-guided bone marrow aspiration and core biopsy of the right posterior ilium. Electronically Signed   By: Albin Felling M.D.   On: 12/14/2021 10:24        Scheduled Meds:  B-complex with vitamin C  1 tablet Oral Daily   feeding supplement  237 mL Oral BID BM   folic acid  1 mg Oral Daily   multivitamin with minerals  1 tablet Oral Daily   pantoprazole (PROTONIX) IV  40 mg Intravenous Q12H   senna-docusate  1 tablet Oral BID   thiamine  100 mg Oral Daily   Continuous Infusions:  ceFEPime (MAXIPIME) IV 2 g (12/16/21 7425)          Aline August, MD [facility] 12/16/2021, 7:49 AM

## 2021-12-16 NOTE — Progress Notes (Signed)
  Echocardiogram 2D Echocardiogram has been performed.  Tyler [name] 12/16/2021, 4:26 PM

## 2021-12-17 DIAGNOSIS — N390 Urinary tract infection, site not specified: Secondary | ICD-10-CM | POA: Diagnosis not present

## 2021-12-17 LAB — CBC WITH DIFFERENTIAL/PLATELET
Abs Immature Granulocytes: 0.04 10*3/uL (ref 0.00–0.07)
Basophils Absolute: 0 10*3/uL (ref 0.0–0.1)
Basophils Relative: 1 %
Eosinophils Absolute: 0 10*3/uL (ref 0.0–0.5)
Eosinophils Relative: 0 %
HCT: 22 % — ABNORMAL LOW (ref 39.0–52.0)
Hemoglobin: 7.1 g/dL — ABNORMAL LOW (ref 13.0–17.0)
Immature Granulocytes: 2 %
Lymphocytes Relative: 32 %
Lymphs Abs: 0.8 10*3/uL (ref 0.7–4.0)
MCH: 28.7 pg (ref 26.0–34.0)
MCHC: 32.3 g/dL (ref 30.0–36.0)
MCV: 89.1 fL (ref 80.0–100.0)
Monocytes Absolute: 0.3 10*3/uL (ref 0.1–1.0)
Monocytes Relative: 10 %
Neutro Abs: 1.4 10*3/uL — ABNORMAL LOW (ref 1.7–7.7)
Neutrophils Relative %: 55 %
Platelets: 37 10*3/uL — ABNORMAL LOW (ref 150–400)
RBC: 2.47 MIL/uL — ABNORMAL LOW (ref 4.22–5.81)
RDW: 20.6 % — ABNORMAL HIGH (ref 11.5–15.5)
WBC: 2.5 10*3/uL — ABNORMAL LOW (ref 4.0–10.5)
nRBC: 6 % — ABNORMAL HIGH (ref 0.0–0.2)

## 2021-12-17 LAB — BASIC METABOLIC PANEL
Anion gap: 10 (ref 5–15)
BUN: 17 mg/dL (ref 8–23)
CO2: 21 mmol/L — ABNORMAL LOW (ref 22–32)
Calcium: 8.2 mg/dL — ABNORMAL LOW (ref 8.9–10.3)
Chloride: 103 mmol/L (ref 98–111)
Creatinine, Ser: 0.99 mg/dL (ref 0.61–1.24)
GFR, Estimated: >60 mL/min (ref >60)
Glucose, Bld: 104 mg/dL — ABNORMAL HIGH (ref 70–99)
Potassium: 4.1 mmol/L (ref 3.5–5.1)
Sodium: 134 mmol/L — ABNORMAL LOW (ref 135–145)

## 2021-12-17 LAB — MAGNESIUM: Magnesium: 1.9 mg/dL (ref 1.7–2.4)

## 2021-12-17 MED ORDER — METOPROLOL TARTRATE 12.5 MG HALF TABLET
12.5000 mg | ORAL_TABLET | Freq: Once | ORAL | Status: AC
  Administered 2021-12-17: 12.5 mg via ORAL
  Filled 2021-12-17: qty 1

## 2021-12-17 MED ORDER — METOPROLOL TARTRATE 12.5 MG HALF TABLET
12.5000 mg | ORAL_TABLET | Freq: Two times a day (BID) | ORAL | Status: DC
  Administered 2021-12-17 – 2021-12-20 (×6): 12.5 mg via ORAL
  Filled 2021-12-17 (×6): qty 1

## 2021-12-17 NOTE — Progress Notes (Addendum)
PROGRESS NOTE    Naethan Bracewell  FXT:[id number] DOB: [birth year] DOA: 12/10/2021 PCP: [name], FNP    Brief Narrative:  This 71 y.o. male with past medical history of prostate cancer status post prostatectomy in 2021, history of bilateral pulmonary embolism after his surgery in 2021, essential hypertension presented with worsening back pain radiating down to right leg along with fevers and hematuria.  On presentation, his hemoglobin was 6.3. He was started on broad-spectrum antibiotics for possible UTI.  Subsequently, oncology was consulted for pancytopenia and neurosurgery was consulted for lumbosacral stenosis and severe lower back pain.  He underwent bone marrow biopsy by IR on 12/14/2021.  Assessment & Plan:   Principal Problem:   UTI (urinary tract infection) Active Problems:   Prostate cancer ([place])   Hypertension   Sepsis ([place])   Normocytic anemia   Thrombocytopenia ([facility])   Hyponatremia   Sepsis secondary to UTI ([facility])   Symptomatic anemia  Sepsis : POA / UTI : POA He presented with a UA: Nitrite+, no pyuria only few bacteria. Blood and urine cultures negative so far. Completed cefepime for 7 days total.  Remains afebrile for last 48 hours..  Vancomycin discontinued on 12/12/2021.   -Has significantly elevated CRP and has pancytopenia.  Unclear if the fever is from an underlying hematological process. -CT of abdomen and pelvis was negative for any acute findings. -MRI of lumbar spine did not show signs of osteomyelitis. -2D echo no evidence of any infection.  LVEF 75%.   CTA chest was negative for PE or any other acute abnormality.   Pancytopenia /Symptomatic anemia: -Status post 3 units PRBC transfusion during this hospitalization, most recent being on 12/14/2021.   Hemoglobin 7.1 this morning.  Monitor H&H.  No overt signs of bleeding. -Oncology following.  Status post bone marrow biopsy by IR on 12/14/2021.  Follow pathology. -Normal vitamin B12 and folate  levels. -No overt signs of bleeding.  Platelets 37 today.   Abdominal pain: -Lipase normal.  LFTs almost unremarkable.   CT of abdomen and pelvis did not show any acute findings.   Continue PPI for now.  Abdominal pain improving.   Severe lower back pain radiating to her right lower extremity Lumbosacral stenosis: -Still having intermittent significant lower back pain radiating to right lower extremity.   MRI of lumbar spine showed moderate to severe thecal sac narrowing posterior to L5 and severe thecal sac narrowing of L5-S1.   -Neurosurgery evaluation appreciated: Patient is not an operative candidate at this time.   Neurosurgery does not believe that patient has severe lumbosacral stenosis and it is unlikely that epidural lipomatosis is responsible for such severe pain. -PT recommends outpatient PT -Continue as needed Tylenol.  Add Lidoderm patch and as needed tramadol.   History of bilateral PE in 2021 -He was on Eliquis up until 2 to 3 months ago when it was discontinued by his urologist.   It was a provoked PE after his prostatectomy in 2021.   ETOH Abuse: -Heavy consumption in the last 2 to 3 months per patient's daughter.   No evidence of withdrawal currently.  Continue CIWA protocol.   Continue thiamine, multivitamins and folic acid.   History of prostate cancer status post prostatectomy in 2021 -Followed by Dr. [name] with urology.  PSA was undetectable earlier this month.   Results available in care everywhere.   Obesity -Outpatient follow-up Estimated body mass index is 28.9 kg/m as calculated from the following:   Height as of this encounter:  5' 7"  (1.702 m).   Weight as of this encounter: 83.7 kg.     DVT prophylaxis: SCDs Code Status: Full code Family Communication: Wife at bed side. Disposition Plan:   Status is: Inpatient Remains inpatient appropriate because: severity of illness.   Consultants:  Oncology  Procedures: Marrow biopsy Antimicrobials:   Anti-infectives (From admission, onward)    Start     Dose/Rate Route Frequency Ordered Stop   12/11/21 1445  vancomycin (VANCOCIN) IVPB 1000 mg/200 mL premix  Status:  Discontinued       See Hyperspace for full Linked Orders Report.   1,000 mg 200 mL/hr over 60 Minutes Intravenous Every 24 hours 12/10/21 1437 12/12/21 1248   12/10/21 1445  ceFEPIme (MAXIPIME) 2 g in sodium chloride 0.9 % 100 mL IVPB        2 g 200 mL/hr over 30 Minutes Intravenous Every 8 hours 12/10/21 1437 12/16/21 2230   12/10/21 1445  vancomycin (VANCOREADY) IVPB 1750 mg/350 mL       See Hyperspace for full Linked Orders Report.   1,750 mg 175 mL/hr over 120 Minutes Intravenous  Once 12/10/21 1437 12/10/21 1709       Subjective: Patient was seen and examined at bedside.  Patient complains of intermittent lower back pain radiating to the right leg.  No overnight events.  Objective: Vitals:   12/16/21 2129 12/17/21 0500 12/17/21 0534 12/17/21 0800  BP: 129/84  139/84 118/75  Pulse: (!) 105  99 (!) 110  Resp: 17  18 20   Temp: 99.5 F (37.5 C)  98.9 F (37.2 C) 99 F (37.2 C)  TempSrc: Oral   Oral  SpO2: 99%  100% 100%  Weight:  83.7 kg    Height:        Intake/Output Summary (Last 24 hours) at 12/17/2021 1503 Last data filed at 12/17/2021 1200 Gross per 24 hour  Intake 580 ml  Output 0 ml  Net 580 ml   Filed Weights   12/15/21 0225 12/16/21 0500 12/17/21 0500  Weight: 83.6 kg 82.9 kg 83.7 kg    Examination:  General exam: Appears comfortable, NAD, chronically ill looking and deconditioned. Respiratory system: CTA bilaterally, no wheezing no crackles, normal respiratory effort. Cardiovascular system: S1 & S2 heard, regular rate and rhythm, no murmur. Gastrointestinal system: Abdomen is soft, non tender, non distended, BS+ Central nervous system: Alert and oriented x 3. No focal neurological deficits. Extremities: No edema, no cyanosis, no clubbing. Skin: No rashes, lesions or  ulcers Psychiatry: Judgement and insight appear normal. Mood & affect appropriate.     Data Reviewed: I have personally reviewed following labs and imaging studies  CBC: Recent Labs  Lab 12/13/21 0209 12/14/21 0421 12/14/21 1014 12/15/21 0305 12/16/21 0220 12/17/21 0328  WBC 2.6* 2.3*  --  2.8* 2.2* 2.5*  NEUTROABS 1.5* 1.2*  --  1.6* 1.2* 1.4*  HGB 7.3* 6.8* 7.1* 8.3* 7.2* 7.1*  HCT 22.4* 21.9* 21.9* 25.1* 21.7* 22.0*  MCV 88.2 90.5  --  87.8 88.6 89.1  PLT 43* 43*  --  39* 35* 37*   Basic Metabolic Panel: Recent Labs  Lab 12/13/21 0209 12/14/21 0421 12/15/21 0305 12/16/21 0220 12/17/21 0328  NA 135 135 136 134* 134*  K 4.2 3.9 4.0 4.0 4.1  CL 106 105 105 101 103  CO2 20* 19* 22 20* 21*  GLUCOSE 150* 117* 106* 110* 104*  BUN 13 13 17 16 17   CREATININE 1.16 0.99 0.93 0.99 0.99  CALCIUM 8.0* 8.2*  8.3* 8.2* 8.2*  MG 1.9 1.7 1.8 1.7 1.9   GFR: Estimated Creatinine Clearance: 72.8 mL/min (by C-G formula based on SCr of 0.99 mg/dL). Liver Function Tests: Recent Labs  Lab 12/12/21 0254 12/13/21 0209 12/14/21 0421 12/15/21 0305 12/16/21 0220  AST 29 30 38 60* 39  ALT 25 22 26  43 36  ALKPHOS 97 107 119 119 107  BILITOT 1.9* 1.2 1.5* 1.8* 1.6*  PROT 5.9* 5.2* 5.1* 5.5* 5.1*  ALBUMIN 2.3* 2.0* 2.0* 2.2* 2.0*   No results for input(s): "LIPASE", "AMYLASE" in the last 168 hours. No results for input(s): "AMMONIA" in the last 168 hours. Coagulation Profile: Recent Labs  Lab 12/11/21 0500 12/11/21 1123  INR 1.4* 1.4*   Cardiac Enzymes: No results for input(s): "CKTOTAL", "CKMB", "CKMBINDEX", "TROPONINI" in the last 168 hours. BNP (last 3 results) No results for input(s): "PROBNP" in the last 8760 hours. HbA1C: No results for input(s): "HGBA1C" in the last 72 hours. CBG: No results for input(s): "GLUCAP" in the last 168 hours. Lipid Profile: No results for input(s): "CHOL", "HDL", "LDLCALC", "TRIG", "CHOLHDL", "LDLDIRECT" in the last 72 hours. Thyroid  Function Tests: No results for input(s): "TSH", "T4TOTAL", "FREET4", "T3FREE", "THYROIDAB" in the last 72 hours. Anemia Panel: No results for input(s): "VITAMINB12", "FOLATE", "FERRITIN", "TIBC", "IRON", "RETICCTPCT" in the last 72 hours. Sepsis Labs: Recent Labs  Lab 12/10/21 2330 12/11/21 0500 12/12/21 0254 12/13/21 0209  PROCALCITON  --  0.20 0.16 0.24  LATICACIDVEN 1.8  --   --   --     Recent Results (from the past 240 hour(s))  Resp Panel by RT-PCR (Flu A&B, Covid) Anterior Nasal Swab     Status: None   Collection Time: 12/10/21 12:16 PM   Specimen: Anterior Nasal Swab  Result Value Ref Range Status   SARS Coronavirus 2 by RT PCR NEGATIVE NEGATIVE Final    Comment: (NOTE) SARS-CoV-2 target nucleic acids are NOT DETECTED.  The SARS-CoV-2 RNA is generally detectable in upper respiratory specimens during the acute phase of infection. The lowest concentration of SARS-CoV-2 viral copies this assay can detect is 138 copies/mL. A negative result does not preclude SARS-Cov-2 infection and should not be used as the sole basis for treatment or other patient management decisions. A negative result may occur with  improper specimen collection/handling, submission of specimen other than nasopharyngeal swab, presence of viral mutation(s) within the areas targeted by this assay, and inadequate number of viral copies(<138 copies/mL). A negative result must be combined with clinical observations, patient history, and epidemiological information. The expected result is Negative.  Fact Sheet for Patients:  EntrepreneurPulse.com.au  Fact Sheet for Healthcare Providers:  IncredibleEmployment.be  This test is no t yet approved or cleared by the [place] FDA and  has been authorized for detection and/or diagnosis of SARS-CoV-2 by FDA under an Emergency Use Authorization (EUA). This EUA will remain  in effect (meaning this test can be used) for the  duration of the COVID-19 declaration under Section 564(b)(1) of the Act, 21 U.S.C.section 360bbb-3(b)(1), unless the authorization is terminated  or revoked sooner.       Influenza A by PCR NEGATIVE NEGATIVE Final   Influenza B by PCR NEGATIVE NEGATIVE Final    Comment: (NOTE) The Xpert Xpress SARS-CoV-2/FLU/RSV plus assay is intended as an aid in the diagnosis of influenza from Nasopharyngeal swab specimens and should not be used as a sole basis for treatment. Nasal washings and aspirates are unacceptable for Xpert Xpress SARS-CoV-2/FLU/RSV testing.  Fact Sheet  for Patients: EntrepreneurPulse.com.au  Fact Sheet for Healthcare Providers: IncredibleEmployment.be  This test is not yet approved or cleared by the [place] FDA and has been authorized for detection and/or diagnosis of SARS-CoV-2 by FDA under an Emergency Use Authorization (EUA). This EUA will remain in effect (meaning this test can be used) for the duration of the COVID-19 declaration under Section 564(b)(1) of the Act, 21 U.S.C. section 360bbb-3(b)(1), unless the authorization is terminated or revoked.  Performed at [facility], [place] [address], [place], [place] [address]   Urine Culture     Status: None   Collection Time: 12/11/21  6:55 AM   Specimen: Urine, Clean Catch  Result Value Ref Range Status   Specimen Description URINE, CLEAN CATCH  Final   Special Requests NONE  Final   Culture   Final    NO GROWTH Performed at [facility], [place] [address], [place], [place] [address]    Report Status 12/12/2021 FINAL  Final  Culture, blood (Routine X 2) w Reflex to ID Panel     Status: None   Collection Time: 12/11/21  6:55 AM   Specimen: BLOOD RIGHT HAND  Result Value Ref Range Status   Specimen Description BLOOD RIGHT HAND  Final   Special Requests   Final    BOTTLES DRAWN AEROBIC AND ANAEROBIC Blood Culture adequate volume   Culture   Final    NO GROWTH  5 DAYS Performed at [facility], [place] [address], [place], [place] [address]    Report Status 12/16/2021 FINAL  Final  Culture, blood (Routine X 2) w Reflex to ID Panel     Status: None   Collection Time: 12/11/21  6:02 PM   Specimen: BLOOD  Result Value Ref Range Status   Specimen Description BLOOD LEFT ANTECUBITAL  Final   Special Requests   Final    BOTTLES DRAWN AEROBIC AND ANAEROBIC Blood Culture adequate volume   Culture   Final    NO GROWTH 5 DAYS Performed at [facility], [place] [address] [place],  [address]    Report Status 12/16/2021 FINAL  Final    Radiology Studies: ECHOCARDIOGRAM COMPLETE  Result Date: 12/16/2021    ECHOCARDIOGRAM REPORT   Patient Name:   MARCUS SCHWANDT Date of Exam: 12/16/2021 Medical Rec #:  [id number]    Height:       67.0 in Accession #:    [id number]   Weight:       182.8 lb Date of Birth:  April 27, 1950    BSA:          1.946 m Patient Age:    42 years     BP:           123/71 mmHg Patient Gender: M            HR:           99 bpm. Exam Location:  Inpatient Procedure: 2D Echo, Cardiac Doppler, Color Doppler and Intracardiac            Opacification Agent Indications:    Fever R50.9  History:        Patient has prior history of Echocardiogram examinations, most                 recent 09/06/2019. Sepsis; Risk Factors:Hypertension and                 Non-Smoker.  Sonographer:    Greer Pickerel Referring Phys:  [id number] KSH[facility]IZ [facility]  Sonographer Comments: Technically difficult study due to poor echo windows and suboptimal apical window. Image acquisition challenging due to patient body habitus and Image acquisition challenging due to respiratory motion. IMPRESSIONS  1. Left ventricular ejection fraction, by estimation, is >75%. The left ventricle has hyperdynamic function. The left ventricle has no regional wall motion abnormalities. There is mild left ventricular hypertrophy. Left ventricular diastolic parameters are consistent with Grade I diastolic  dysfunction (impaired relaxation).  2. Right ventricular systolic function is normal. The right ventricular size is normal. Tricuspid regurgitation signal is inadequate for assessing PA pressure.  3. The mitral valve is normal in structure. No evidence of mitral valve regurgitation. No evidence of mitral stenosis.  4. The aortic valve has an indeterminant number of cusps. Aortic valve regurgitation is not visualized. No aortic stenosis is present.  5. The inferior vena cava is normal in size with greater than 50% respiratory variability, suggesting right atrial pressure of 3 mmHg. FINDINGS  Left Ventricle: Left ventricular ejection fraction, by estimation, is >75%. The left ventricle has hyperdynamic function. The left ventricle has no regional wall motion abnormalities. The left ventricular internal cavity size was normal in size. There is mild left ventricular hypertrophy. Left ventricular diastolic parameters are consistent with Grade I diastolic dysfunction (impaired relaxation). Normal left ventricular filling pressure. Right Ventricle: The right ventricular size is normal. Right vetricular wall thickness was not well visualized. Right ventricular systolic function is normal. Tricuspid regurgitation signal is inadequate for assessing PA pressure. Left Atrium: Left atrial size was normal in size. Right Atrium: Right atrial size was not well visualized. Pericardium: There is no evidence of pericardial effusion. Mitral Valve: The mitral valve is normal in structure. No evidence of mitral valve regurgitation. No evidence of mitral valve stenosis. Tricuspid Valve: The tricuspid valve is normal in structure. Tricuspid valve regurgitation is not demonstrated. No evidence of tricuspid stenosis. Aortic Valve: The aortic valve has an indeterminant number of cusps. Aortic valve regurgitation is not visualized. No aortic stenosis is present. Aortic valve mean gradient measures 4.0 mmHg. Aortic valve peak gradient measures  10.0 mmHg. Aortic valve area, by VTI measures 2.52 cm. Pulmonic Valve: The pulmonic valve was not well visualized. Pulmonic valve regurgitation is not visualized. No evidence of pulmonic stenosis. Aorta: The aortic root and ascending aorta are structurally normal, with no evidence of dilitation. Venous: The inferior vena cava is normal in size with greater than 50% respiratory variability, suggesting right atrial pressure of 3 mmHg. IAS/Shunts: No atrial level shunt detected by color flow Doppler.  LEFT VENTRICLE PLAX 2D LVIDd:         3.10 cm   Diastology LVIDs:         1.80 cm   LV e' medial:    10.40 cm/s LV PW:         1.20 cm   LV E/e' medial:  4.5 LV IVS:        1.00 cm   LV e' lateral:   10.90 cm/s LVOT diam:     2.10 cm   LV E/e' lateral: 4.2 LV SV:         61 LV SV Index:   31 LVOT Area:     3.46 cm  RIGHT VENTRICLE RV S prime:     23.60 cm/s TAPSE (M-mode): 1.8 cm LEFT ATRIUM             Index        RIGHT ATRIUM  Index LA diam:        2.60 cm 1.34 cm/m   RA Area:     11.70 cm LA Vol (A2C):   66.9 ml 34.37 ml/m  RA Volume:   24.20 ml  12.43 ml/m LA Vol (A4C):   36.8 ml 18.91 ml/m LA Biplane Vol: 52.3 ml 26.87 ml/m  AORTIC VALVE AV Area (Vmax):    2.43 cm AV Area (Vmean):   2.92 cm AV Area (VTI):     2.52 cm AV Vmax:           158.36 cm/s AV Vmean:          88.501 cm/s AV VTI:            0.243 m AV Peak Grad:      10.0 mmHg AV Mean Grad:      4.0 mmHg LVOT Vmax:         111.00 cm/s LVOT Vmean:        74.600 cm/s LVOT VTI:          0.177 m LVOT/AV VTI ratio: 0.73  AORTA Ao Root diam: 3.30 cm Ao Asc diam:  3.40 cm MITRAL VALVE MV Area (PHT): 4.06 cm    SHUNTS MV Decel Time: 187 msec    Systemic VTI:  0.18 m MV E velocity: 46.30 cm/s  Systemic Diam: 2.10 cm MV A velocity: 75.80 cm/s MV E/A ratio:  0.61 Carlyle Dolly MD Electronically signed by Carlyle Dolly MD Signature Date/Time: 12/16/2021/12:16:02 PM    Final    CT Angio Chest Pulmonary Embolism (PE) W or WO Contrast  Result  Date: 12/15/2021 CLINICAL DATA:  Pulmonary embolism (PE) suspected, high prob EXAM: CT ANGIOGRAPHY CHEST WITH CONTRAST TECHNIQUE: Multidetector CT imaging of the chest was performed using the standard protocol during bolus administration of intravenous contrast. Multiplanar CT image reconstructions and MIPs were obtained to evaluate the vascular anatomy. RADIATION DOSE REDUCTION: This exam was performed according to the departmental dose-optimization program which includes automated exposure control, adjustment of the mA and/or kV according to patient size and/or use of iterative reconstruction technique. CONTRAST:  58m OMNIPAQUE IOHEXOL 350 MG/ML SOLN COMPARISON:  X-ray 12/10/2021.  CT 09/05/2019 FINDINGS: Cardiovascular: Satisfactory opacification of the pulmonary arteries to the segmental level. No evidence of pulmonary embolism. Thoracic aorta is nonaneurysmal. Normal heart size. No pericardial effusion. Mediastinum/Nodes: No enlarged mediastinal, hilar, or axillary lymph nodes. Thyroid gland, trachea, and esophagus demonstrate no significant findings. Lungs/Pleura: No airspace consolidation. No pleural effusion or pneumothorax. Upper Abdomen: No acute abnormality. Musculoskeletal: No chest wall abnormality. No acute or significant osseous findings. Review of the MIP images confirms the above findings. IMPRESSION: No evidence of pulmonary embolism or other acute intrathoracic findings. Electronically Signed   By: NDavina PokeD.O.   On: 12/15/2021 16:21    Scheduled Meds:  B-complex with vitamin C  1 tablet Oral Daily   feeding supplement  237 mL Oral BID BM   folic acid  1 mg Oral Daily   lidocaine  1 patch Transdermal Q24H   multivitamin with minerals  1 tablet Oral Daily   pantoprazole (PROTONIX) IV  40 mg Intravenous Q12H   senna-docusate  1 tablet Oral BID   thiamine  100 mg Oral Daily   Continuous Infusions:   LOS: 7 days    Time spent: 50 mins    Kuba Shepherd, MD [facility]   If 7PM-7AM, please contact night-coverage

## 2021-12-17 NOTE — Progress Notes (Signed)
Physical Therapy Treatment and Discharge  Patient Details Name: Tyler [name] MRN: [id number] DOB: 04-Dec-1950 Today's Date: 12/17/2021   History of Present Illness 71 y.o. male adm 12/10/21 with sciatica, fever, and anemia 6.2. sepsis 2/2 UTI; MRI spine shows epidural lipomatosis; PMH prostate cancer status post prostatectomy in 2021, history of bilateral pulmonary embolism, essential hypertension    PT Comments    Session focused on walking challenge, and pt was able to walk on unit, get downstairs in elelvator, and walk outside on uneven terrain,  without difficulty; Acute PT goals met; will sign off, and hand off to Mobility Team for continuing walks while inpt  Recommendations for follow up therapy are one component of a multi-disciplinary discharge planning process, led by the attending physician.  Recommendations may be updated based on patient status, additional functional criteria and insurance authorization.  Follow Up Recommendations  Outpatient PT     Assistance Recommended at Discharge  PRN  Patient can return home with the following  PRN   Equipment Recommendations  Rolling walker (2 wheels) (can be helpful when pt has bouts of back pian)    Recommendations for Other Services       Precautions / Restrictions Precautions Precautions: Fall Precaution Comments: reports 3 falls with onset of pain; fall risk is present, but minimal     Mobility  Bed Mobility Overal bed mobility: Independent                  Transfers Overall transfer level: Modified independent Equipment used: None                    Ambulation/Gait Ambulation/Gait assistance: Supervision, Modified independent (Device/Increase time) Gait Distance (Feet): 1000 Feet (greater than) Assistive device: None Gait Pattern/deviations: Step-through pattern       General Gait Details: supervision for safety, progressing to independent; walked in more challenging environment, including  outside and over grass; No LOB. Was able to change pace when cued. HR stable in low range 110's to 130s, Mild DOE 2/4   Stairs Stairs: Yes Stairs assistance: Modified independent (Device/Increase time) Stair Management: No rails, Alternating pattern Number of Stairs: 10 (5+5) General stair comments: No difficulty   Wheelchair Mobility    Modified Rankin (Stroke Patients Only)       Balance Overall balance assessment: Mild deficits observed, not formally tested, History of Falls, Modified Independent (lacks higher level balance skills)                                          Cognition Arousal/Alertness: Awake/alert Behavior During Therapy: WFL for tasks assessed/performed Overall Cognitive Status: Within Functional Limits for tasks assessed                                          Exercises      General Comments General comments (skin integrity, edema, etc.): Seemed to enjoy being outside; Lorenzo, Academic librarian for [employer], present for clear communication; Pt indicated an interpreter was not needed      Pertinent Vitals/Pain Pain Assessment Pain Assessment: No/denies pain    Home Living                          Prior Function  PT Goals (current goals can now be found in the care plan section) Acute Rehab PT Goals Patient Stated Goal: return home Progress towards PT goals: Goals met/education completed, patient discharged from PT    Frequency    Min 3X/week      PT Plan Current plan remains appropriate    Co-evaluation              AM-PAC PT "6 Clicks" Mobility   Outcome Measure  Help needed turning from your back to your side while in a flat bed without using bedrails?: None Help needed moving from lying on your back to sitting on the side of a flat bed without using bedrails?: None Help needed moving to and from a bed to a chair (including a wheelchair)?: None Help needed  standing up from a chair using your arms (e.g., wheelchair or bedside chair)?: None Help needed to walk in hospital room?: None Help needed climbing 3-5 steps with a railing? : A Little 6 Click Score: 23    End of Session Equipment Utilized During Treatment: Gait belt Activity Tolerance: Patient tolerated treatment well Patient left: Other (comment) (Managing independnetly in his room) Nurse Communication: Mobility status PT Visit Diagnosis: Unsteadiness on feet (R26.81);Muscle weakness (generalized) (M62.81);History of falling (Z91.81);Difficulty in walking, not elsewhere classified (R26.2)     Time: 8833-7445 PT Time Calculation (min) (ACUTE ONLY): 26 min  Charges:  $Gait Training: 23-37 mins                     Roney Marion, [place] Office [phone]    Colletta [place] 12/17/2021, 5:50 PM

## 2021-12-18 DIAGNOSIS — N390 Urinary tract infection, site not specified: Secondary | ICD-10-CM | POA: Diagnosis not present

## 2021-12-18 LAB — URINALYSIS, COMPLETE (UACMP) WITH MICROSCOPIC
Bacteria, UA: NONE SEEN
Bilirubin Urine: NEGATIVE
Glucose, UA: NEGATIVE mg/dL
Hgb urine dipstick: NEGATIVE
Ketones, ur: NEGATIVE mg/dL
Leukocytes,Ua: NEGATIVE
Nitrite: NEGATIVE
Protein, ur: 30 mg/dL — AB
Specific Gravity, Urine: 1.02 (ref 1.005–1.030)
pH: 5 (ref 5.0–8.0)

## 2021-12-18 LAB — PREPARE RBC (CROSSMATCH)

## 2021-12-18 LAB — CBC
HCT: 20.8 % — ABNORMAL LOW (ref 39.0–52.0)
Hemoglobin: 6.9 g/dL — CL (ref 13.0–17.0)
MCH: 29.1 pg (ref 26.0–34.0)
MCHC: 33.2 g/dL (ref 30.0–36.0)
MCV: 87.8 fL (ref 80.0–100.0)
Platelets: 36 10*3/uL — ABNORMAL LOW (ref 150–400)
RBC: 2.37 MIL/uL — ABNORMAL LOW (ref 4.22–5.81)
RDW: 20.2 % — ABNORMAL HIGH (ref 11.5–15.5)
WBC: 2.2 10*3/uL — ABNORMAL LOW (ref 4.0–10.5)
nRBC: 4.1 % — ABNORMAL HIGH (ref 0.0–0.2)

## 2021-12-18 MED ORDER — SODIUM CHLORIDE 0.9% IV SOLUTION: Freq: Once | INTRAVENOUS | Status: AC

## 2021-12-18 MED ORDER — ACETAMINOPHEN 325 MG PO TABS
650.0000 mg | ORAL_TABLET | Freq: Once | ORAL | Status: AC
  Administered 2021-12-18: 650 mg via ORAL

## 2021-12-18 NOTE — Progress Notes (Incomplete)
HEMATOLOGY/ONCOLOGY INPATIENT NOTE  Date of Service: 12/18/2021  Patient Care Team: [name], FNP as PCP - General (Family Medicine)  CHIEF COMPLAINTS/PURPOSE OF CONSULTATION:  Evaluation and consultation for painless hematuria***  HISTORY OF PRESENTING ILLNESS:  Tyler [name] is a wonderful 71 y.o. male who has been referred to [place] by Dr Arbutus Leas, Garen Lah, FNP for evaluation and consultation for painless hematuria***. He reports He is doing well.  He originally presented to the ED 11/28/2021 with worsening back pain, fevers, and hematuria. Later found to be sepsis when he returned to ED 12/10/2021 with significant abdominal pain.  ***  Labs done today were reviewed in detail.  MEDICAL HISTORY:  Past Medical History:  Diagnosis Date   Cancer [facility])    prostate   Hypertension     SURGICAL HISTORY: Past Surgical History:  Procedure Laterality Date   LYMPHADENECTOMY Bilateral 09/01/2019   Procedure: LYMPHADENECTOMY;  Surgeon: Alexis Frock, MD;  Location: [facility];  Service: Urology;  Laterality: Bilateral;   ROBOT ASSISTED LAPAROSCOPIC RADICAL PROSTATECTOMY N/A 09/01/2019   Procedure: XI ROBOTIC ASSISTED LAPAROSCOPIC RADICAL PROSTATECTOMY;  Surgeon: Alexis Frock, MD;  Location: [facility];  Service: Urology;  Laterality: N/A;  3 HRS    SOCIAL HISTORY: Social History   Socioeconomic History   Marital status: Married    Spouse name: Not on file   Number of children: Not on file   Years of education: Not on file   Highest education level: Not on file  Occupational History   Not on file  Tobacco Use   Smoking status: Never   Smokeless tobacco: Never  Vaping Use   Vaping Use: Never used  Substance and Sexual Activity   Alcohol use: Yes    Comment: at least one 40 oz beer daily   Drug use: Never   Sexual activity: Not on file  Other Topics Concern   Not on file  Social History Narrative   Not on file   Social Determinants of Health   Financial  Resource Strain: Not on file  Food Insecurity: Not on file  Transportation Needs: Not on file  Physical Activity: Not on file  Stress: Not on file  Social Connections: Not on file  Intimate Partner Violence: Not on file    FAMILY HISTORY: Family History  Problem Relation Age of Onset   Stroke Father     ALLERGIES:  is allergic to amlodipine.  MEDICATIONS:  Current Facility-Administered Medications  Medication Dose Route Frequency Provider Last Rate Last Admin   acetaminophen (TYLENOL) tablet 650 mg  650 mg Oral Q6H PRN Bonnielee Haff, MD   650 mg at 12/18/21 1341   Or   acetaminophen (TYLENOL) suppository 650 mg  650 mg Rectal Q6H PRN Bonnielee Haff, MD       B-complex with vitamin C tablet 1 tablet  1 tablet Oral Daily Brunetta Genera, MD   1 tablet at 12/18/21 0950   feeding supplement (ENSURE ENLIVE / ENSURE PLUS) liquid 237 mL  237 mL Oral BID BM Alekh, Kshitiz, MD   237 mL at 17/49/44 9675   folic acid (FOLVITE) tablet 1 mg  1 mg Oral Daily Bonnielee Haff, MD   1 mg at 12/18/21 0950   lidocaine (LIDODERM) 5 % 1 patch  1 patch Transdermal Q24H Aline August, MD   1 patch at 12/18/21 1342   metoprolol tartrate (LOPRESSOR) tablet 12.5 mg  12.5 mg Oral BID Shawna Clamp, MD   12.5 mg at 12/18/21 [phone]  multivitamin with minerals tablet 1 tablet  1 tablet Oral Daily Bonnielee Haff, MD   1 tablet at 12/18/21 0950   ondansetron (ZOFRAN) tablet 4 mg  4 mg Oral Q6H PRN Bonnielee Haff, MD       Or   ondansetron [facility]) injection 4 mg  4 mg Intravenous Q6H PRN Bonnielee Haff, MD       Oral care mouth rinse  15 mL Mouth Rinse PRN Bonnielee Haff, MD       pantoprazole (PROTONIX) injection 40 mg  40 mg Intravenous Q12H Bonnielee Haff, MD   40 mg at 12/18/21 0950   polyethylene glycol (MIRALAX / GLYCOLAX) packet 17 g  17 g Oral Daily PRN Aline August, MD       senna-docusate (Senokot-S) tablet 1 tablet  1 tablet Oral BID Aline August, MD   1 tablet at 12/18/21 0950    thiamine (VITAMIN B1) tablet 100 mg  100 mg Oral Daily Bonnielee Haff, MD   100 mg at 12/18/21 0950   traMADol (ULTRAM) tablet 50 mg  50 mg Oral Q6H PRN Aline August, MD   50 mg at 12/16/21 1454    REVIEW OF SYSTEMS:    10 Point review of Systems was done is negative except as noted above.  PHYSICAL EXAMINATION: ECOG PERFORMANCE STATUS: {CHL ONC ECOG TR:[id number]}  . Vitals:   12/18/21 1449 12/18/21 1639  BP: 114/71 123/82  Pulse: 85 85  Resp: 18 18  Temp: 99.7 F (37.6 C) 98.6 F (37 C)  SpO2: 100% 100%   Filed Weights   12/15/21 0225 12/16/21 0500 12/17/21 0500  Weight: 184 lb 4.9 oz (83.6 kg) 182 lb 12.2 oz (82.9 kg) 184 lb 8.4 oz (83.7 kg)   .Body mass index is 28.9 kg/m.  GENERAL:alert, in no acute distress and comfortable SKIN: no acute rashes, no significant lesions EYES: conjunctiva are pink and non-injected, sclera anicteric NECK: supple, no JVD LYMPH:  no palpable lymphadenopathy in the cervical, axillary or inguinal regions LUNGS: clear to auscultation b/l with normal respiratory effort HEART: regular rate & rhythm ABDOMEN:  normoactive bowel sounds , non tender, not distended. Extremity: no pedal edema PSYCH: alert & oriented x 3 with fluent speech NEURO: no focal motor/sensory deficits  Exam performed in bed.  LABORATORY DATA:  I have reviewed the data as listed  .    Latest Ref Rng & Units 12/18/2021    4:44 AM 12/17/2021    3:28 AM 12/16/2021    2:20 AM  CBC  WBC 4.0 - 10.5 K/uL 2.2  2.5  2.2   Hemoglobin 13.0 - 17.0 g/dL 6.9  7.1  7.2   Hematocrit 39.0 - 52.0 % 20.8  22.0  21.7   Platelets 150 - 400 K/uL 36  37  35     .    Latest Ref Rng & Units 12/17/2021    3:28 AM 12/16/2021    2:20 AM 12/15/2021    3:05 AM  CMP  Glucose 70 - 99 mg/dL 104  110  106   BUN 8 - 23 mg/dL 17  16  17    Creatinine 0.61 - 1.24 mg/dL 0.99  0.99  0.93   Sodium 135 - 145 mmol/L 134  134  136   Potassium 3.5 - 5.1 mmol/L 4.1  4.0  4.0   Chloride 98 -  111 mmol/L 103  101  105   CO2 22 - 32 mmol/L 21  20  22    Calcium 8.9 -  10.3 mg/dL 8.2  8.2  8.3   Total Protein 6.5 - 8.1 g/dL  5.1  5.5   Total Bilirubin 0.3 - 1.2 mg/dL  1.6  1.8   Alkaline Phos 38 - 126 U/L  107  119   AST 15 - 41 U/L  39  60   ALT 0 - 44 U/L  36  43      RADIOGRAPHIC STUDIES: I have personally reviewed the radiological images as listed and agreed with the findings in the report. ECHOCARDIOGRAM COMPLETE  Result Date: 12/16/2021    ECHOCARDIOGRAM REPORT   Patient Name:   RUDOLPHO CLAXTON Date of Exam: 12/16/2021 Medical Rec #:  [id number]    Height:       67.0 in Accession #:    [id number]   Weight:       182.8 lb Date of Birth:  [birth year]    BSA:          1.946 m Patient Age:    30 years     BP:           123/71 mmHg Patient Gender: M            HR:           99 bpm. Exam Location:  Inpatient Procedure: 2D Echo, Cardiac Doppler, Color Doppler and Intracardiac            Opacification Agent Indications:    Fever R50.9  History:        Patient has prior history of Echocardiogram examinations, most                 recent 09/06/2019. Sepsis; Risk Factors:Hypertension and                 Non-Smoker.  Sonographer:    Greer Pickerel Referring Phys: [id number] [facility]  Sonographer Comments: Technically difficult study due to poor echo windows and suboptimal apical window. Image acquisition challenging due to patient body habitus and Image acquisition challenging due to respiratory motion. IMPRESSIONS  1. Left ventricular ejection fraction, by estimation, is >75%. The left ventricle has hyperdynamic function. The left ventricle has no regional wall motion abnormalities. There is mild left ventricular hypertrophy. Left ventricular diastolic parameters are consistent with Grade I diastolic dysfunction (impaired relaxation).  2. Right ventricular systolic function is normal. The right ventricular size is normal. Tricuspid regurgitation signal is inadequate for assessing PA pressure.  3. The  mitral valve is normal in structure. No evidence of mitral valve regurgitation. No evidence of mitral stenosis.  4. The aortic valve has an indeterminant number of cusps. Aortic valve regurgitation is not visualized. No aortic stenosis is present.  5. The inferior vena cava is normal in size with greater than 50% respiratory variability, suggesting right atrial pressure of 3 mmHg. FINDINGS  Left Ventricle: Left ventricular ejection fraction, by estimation, is >75%. The left ventricle has hyperdynamic function. The left ventricle has no regional wall motion abnormalities. The left ventricular internal cavity size was normal in size. There is mild left ventricular hypertrophy. Left ventricular diastolic parameters are consistent with Grade I diastolic dysfunction (impaired relaxation). Normal left ventricular filling pressure. Right Ventricle: The right ventricular size is normal. Right vetricular wall thickness was not well visualized. Right ventricular systolic function is normal. Tricuspid regurgitation signal is inadequate for assessing PA pressure. Left Atrium: Left atrial size was normal in size. Right Atrium: Right atrial size was not well visualized. Pericardium: There is no evidence of pericardial effusion.  Mitral Valve: The mitral valve is normal in structure. No evidence of mitral valve regurgitation. No evidence of mitral valve stenosis. Tricuspid Valve: The tricuspid valve is normal in structure. Tricuspid valve regurgitation is not demonstrated. No evidence of tricuspid stenosis. Aortic Valve: The aortic valve has an indeterminant number of cusps. Aortic valve regurgitation is not visualized. No aortic stenosis is present. Aortic valve mean gradient measures 4.0 mmHg. Aortic valve peak gradient measures 10.0 mmHg. Aortic valve area, by VTI measures 2.52 cm. Pulmonic Valve: The pulmonic valve was not well visualized. Pulmonic valve regurgitation is not visualized. No evidence of pulmonic stenosis. Aorta:  The aortic root and ascending aorta are structurally normal, with no evidence of dilitation. Venous: The inferior vena cava is normal in size with greater than 50% respiratory variability, suggesting right atrial pressure of 3 mmHg. IAS/Shunts: No atrial level shunt detected by color flow Doppler.  LEFT VENTRICLE PLAX 2D LVIDd:         3.10 cm   Diastology LVIDs:         1.80 cm   LV e' medial:    10.40 cm/s LV PW:         1.20 cm   LV E/e' medial:  4.5 LV IVS:        1.00 cm   LV e' lateral:   10.90 cm/s LVOT diam:     2.10 cm   LV E/e' lateral: 4.2 LV SV:         61 LV SV Index:   31 LVOT Area:     3.46 cm  RIGHT VENTRICLE RV S prime:     23.60 cm/s TAPSE (M-mode): 1.8 cm LEFT ATRIUM             Index        RIGHT ATRIUM           Index LA diam:        2.60 cm 1.34 cm/m   RA Area:     11.70 cm LA Vol (A2C):   66.9 ml 34.37 ml/m  RA Volume:   24.20 ml  12.43 ml/m LA Vol (A4C):   36.8 ml 18.91 ml/m LA Biplane Vol: 52.3 ml 26.87 ml/m  AORTIC VALVE AV Area (Vmax):    2.43 cm AV Area (Vmean):   2.92 cm AV Area (VTI):     2.52 cm AV Vmax:           158.36 cm/s AV Vmean:          88.501 cm/s AV VTI:            0.243 m AV Peak Grad:      10.0 mmHg AV Mean Grad:      4.0 mmHg LVOT Vmax:         111.00 cm/s LVOT Vmean:        74.600 cm/s LVOT VTI:          0.177 m LVOT/AV VTI ratio: 0.73  AORTA Ao Root diam: 3.30 cm Ao Asc diam:  3.40 cm MITRAL VALVE MV Area (PHT): 4.06 cm    SHUNTS MV Decel Time: 187 msec    Systemic VTI:  0.18 m MV E velocity: 46.30 cm/s  Systemic Diam: 2.10 cm MV A velocity: 75.80 cm/s MV E/A ratio:  0.61 Carlyle Dolly MD Electronically signed by Carlyle Dolly MD Signature Date/Time: 12/16/2021/12:16:02 PM    Final    CT Angio Chest Pulmonary Embolism (PE) W or WO Contrast  Result Date: 12/15/2021  CLINICAL DATA:  Pulmonary embolism (PE) suspected, high prob EXAM: CT ANGIOGRAPHY CHEST WITH CONTRAST TECHNIQUE: Multidetector CT imaging of the chest was performed using the standard  protocol during bolus administration of intravenous contrast. Multiplanar CT image reconstructions and MIPs were obtained to evaluate the vascular anatomy. RADIATION DOSE REDUCTION: This exam was performed according to the departmental dose-optimization program which includes automated exposure control, adjustment of the mA and/or kV according to patient size and/or use of iterative reconstruction technique. CONTRAST:  55m OMNIPAQUE IOHEXOL 350 MG/ML SOLN COMPARISON:  X-ray 12/10/2021.  CT 09/05/2019 FINDINGS: Cardiovascular: Satisfactory opacification of the pulmonary arteries to the segmental level. No evidence of pulmonary embolism. Thoracic aorta is nonaneurysmal. Normal heart size. No pericardial effusion. Mediastinum/Nodes: No enlarged mediastinal, hilar, or axillary lymph nodes. Thyroid gland, trachea, and esophagus demonstrate no significant findings. Lungs/Pleura: No airspace consolidation. No pleural effusion or pneumothorax. Upper Abdomen: No acute abnormality. Musculoskeletal: No chest wall abnormality. No acute or significant osseous findings. Review of the MIP images confirms the above findings. IMPRESSION: No evidence of pulmonary embolism or other acute intrathoracic findings. Electronically Signed   By: NDavina PokeD.O.   On: 12/15/2021 16:21   CT BONE MARROW BIOPSY & ASPIRATION  Result Date: 12/14/2021 INDICATION: Pancytopenia EXAM: CT BONE MARROW BIOPSY AND ASPIRATION MEDICATIONS: None. ANESTHESIA/SEDATION: Moderate (conscious) sedation was employed during this procedure. A total of Versed 1.5 mg and Fentanyl 75 mcg was administered intravenously. Moderate Sedation Time: 11 minutes. The patient's level of consciousness and vital signs were monitored continuously by radiology nursing throughout the procedure under my direct supervision. FLUOROSCOPY TIME:  N/a COMPLICATIONS: None immediate. PROCEDURE: Informed written consent was obtained from the patient after a thorough discussion of  the procedural risks, benefits and alternatives. All questions were addressed. Maximal Sterile Barrier Technique was utilized including caps, mask, sterile gowns, sterile gloves, sterile drape, hand hygiene and skin antiseptic. A timeout was performed prior to the initiation of the procedure. The patient was placed prone on the CT exam table. Limited CT of the pelvis was performed for planning purposes. Skin entry site was marked, and the overlying skin was prepped and draped in the standard sterile fashion. Local analgesia was obtained with 1% lidocaine. Using CT guidance, an 11 gauge needle was advanced just deep to the cortex of the right posterior ilium. Subsequently, bone marrow aspiration and core biopsy were performed. Specimens were submitted to lab/pathology for handling. Hemostasis was achieved with manual pressure, and a clean dressing was placed. The patient tolerated the procedure well without immediate complication. IMPRESSION: Successful CT-guided bone marrow aspiration and core biopsy of the right posterior ilium. Electronically Signed   By: YAlbin FellingM.D.   On: 12/14/2021 10:24   MR LUMBAR SPINE WO CONTRAST  Result Date: 12/11/2021 CLINICAL DATA:  Low back pain EXAM: MRI LUMBAR SPINE WITHOUT CONTRAST TECHNIQUE: Multiplanar, multisequence MR imaging of the lumbar spine was performed. No intravenous contrast was administered. COMPARISON:  No prior MRI of the lumbar spine, correlation is made with 11/18/2021 lumbar spine radiographs and 12/10/2021 CT abdomen pelvis FINDINGS: Segmentation:  5 lumbar-type vertebral bodies. Alignment:  Mild levocurvature.  No listhesis. Vertebrae: No acute fracture or suspicious osseous lesion. Diffusely decreased marrow signal, which on T1 is slightly hyperintense to skeletal muscle, likely red marrow reconversion. Speckled marrow signal in the bilateral iliac bones and, to a lesser extent, vertebral bodies, which is favored to be benign given its appearance on  the 12/10/2021 CT. Conus medullaris and cauda equina: Conus extends  to the T12-L1 level. Conus and cauda equina appear normal. Paraspinal and other soft tissues: Right renal cyst, better evaluated on the prior CT, for which no follow-up is currently indicated. Disc levels: T12-L1: Disc desiccation without significant disc bulge. No spinal canal stenosis or neural foraminal narrowing. L1-L2: No significant disc bulge. No spinal canal stenosis or neural foraminal narrowing. L2-L3: Disc desiccation and minimal disc bulge with central annular fissure. Mild facet arthropathy. No spinal canal stenosis or neural foraminal narrowing. L3-L4: Disc desiccation and minimal disc bulge with left foraminal protrusion. Mild facet arthropathy. No spinal canal stenosis or neural foraminal narrowing. L4-L5: Disc desiccation and minimal disc bulge. Mild facet arthropathy. Epidural lipomatosis causes mild thecal sac narrowing at the disc level and moderate to severe thecal sac narrowing posterior to L5. No neural foraminal narrowing. L5-S1: No significant disc bulge. Mild facet arthropathy. Epidural lipomatosis, which causes severe thecal sac narrowing. No neural foraminal narrowing. IMPRESSION: 1. 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 No neural foraminal narrowing. 2. Diffusely decreased marrow signal, slightly hyperintense to skeletal muscle, likely red marrow reconversion. Additional speckled marrow signal in the bilateral iliac bones and, to a lesser extent, vertebral bodies, is favored to be benign given its appearance on the 12/10/2021 CT. A diffuse infiltrative bone marrow process is felt to be less likely. Consider correlation with serum labs. Electronically Signed   By: Merilyn Baba M.D.   On: 12/11/2021 13:07   CT ABDOMEN PELVIS W  CONTRAST  Result Date: 12/10/2021 CLINICAL DATA:  LEFT lower quadrant abdominal pain.  Sepsis EXAM: CT ABDOMEN AND PELVIS WITH CONTRAST TECHNIQUE: Multidetector CT imaging of the abdomen and pelvis was performed using the standard protocol following bolus administration of intravenous contrast. RADIATION DOSE REDUCTION: This exam was performed according to the departmental dose-optimization program which includes automated exposure control, adjustment of the mA and/or kV according to patient size and/or use of iterative reconstruction technique. CONTRAST:  62m OMNIPAQUE IOHEXOL 350 MG/ML SOLN COMPARISON:  None Available. FINDINGS: Lower chest: Lung bases are clear. Hepatobiliary: No focal hepatic lesion. No biliary duct dilatation. Common bile duct is normal. Pancreas: Pancreas is normal. No ductal dilatation. No pancreatic inflammation. Spleen: Normal spleen Adrenals/urinary tract: Adrenal glands normal. Bilateral simple fluid attenuation renal cysts. No follow-up recommended for Bosniak 1 renal cysts. Ureters and bladder normal. Stomach/Bowel: Stomach, small bowel, appendix, and cecum are normal. The colon and rectosigmoid colon are normal. Vascular/Lymphatic: Abdominal aorta is normal caliber. No periportal or retroperitoneal adenopathy. No pelvic adenopathy. Reproductive: Post prostatectomy Other: Prior LEFT inguinal hernia repair.  No evidence recurrence. Musculoskeletal: No aggressive osseous lesion. IMPRESSION: 1. No acute findings abdomen pelvis. 2. Prior LEFT inguinal hernia repair without complication. 3. Bilateral benign renal cysts.  No obstructive uropathy Electronically Signed   By: SSuzy BouchardM.D.   On: 12/10/2021 16:15   DG Chest 2 View  Result Date: 12/10/2021 CLINICAL DATA:  Chest pain. EXAM: CHEST - 2 VIEW COMPARISON:  Chest x-ray 09/07/2019. FINDINGS: The heart size and mediastinal contours are within normal limits. Both lungs are clear. No visible pleural effusions or  pneumothorax. No acute osseous abnormality. IMPRESSION: No active cardiopulmonary disease. Electronically Signed   By: FMargaretha SheffieldM.D.   On: 12/10/2021 12:44   CT ABDOMEN PELVIS W CONTRAST  Result Date: 11/29/2021 CLINICAL DATA:  Abdominal pain  and emesis EXAM: CT ABDOMEN AND PELVIS WITH CONTRAST TECHNIQUE: Multidetector CT imaging of the abdomen and pelvis was performed using the standard protocol following bolus administration of intravenous contrast. RADIATION DOSE REDUCTION: This exam was performed according to the departmental dose-optimization program which includes automated exposure control, adjustment of the mA and/or kV according to patient size and/or use of iterative reconstruction technique. CONTRAST:  4m OMNIPAQUE IOHEXOL 350 MG/ML SOLN COMPARISON:  CT abdomen and pelvis dated July 13th 2021 FINDINGS: Lower chest: No acute abnormality. Hepatobiliary: No focal liver abnormality is seen. No gallstones, gallbladder wall thickening, or biliary dilatation. Pancreas: Unremarkable. No pancreatic ductal dilatation or surrounding inflammatory changes. Spleen: Normal in size without focal abnormality. Adrenals/Urinary Tract: Bilateral adrenal glands are unremarkable. No hydronephrosis or nephrolithiasis. Bilateral low-attenuation renal lesions largest are compatible with simple cysts, others are too small to completely characterize, no further follow-up imaging is recommended. Bladder is unremarkable. Stomach/Bowel: Stomach is within normal limits. Appendix appears normal. No evidence of bowel wall thickening, distention, or inflammatory changes. Vascular/Lymphatic: No significant vascular findings are present. No enlarged abdominal or pelvic lymph nodes. Reproductive: Prostate is surgically absent. Ovoid structures previously described along the pelvic sidewalls are no longer present, likely resolved hematomas. Other: No abdominal wall hernia or abnormality. No abdominopelvic ascites.  Musculoskeletal: No acute or significant osseous findings. IMPRESSION: No acute findings in the abdomen or pelvis. Electronically Signed   By: LYetta GlassmanM.D.   On: 11/29/2021 08:52   DG Lumbar Spine Complete  Result Date: 11/18/2021 CLINICAL DATA:  Lumbosacral back pain and radiculopathy. EXAM: LUMBAR SPINE - COMPLETE 4+ VIEW COMPARISON:  CT of the abdomen September 07, 2019 FINDINGS: There is no evidence of lumbar spine fracture.  Alignment is normal. Multilevel osteoarthritic changes with disc space narrowing, endplate sclerosis and osteophytosis. Moderate posterior facet arthropathy in the lower lumbosacral spine. IMPRESSION: 1. Multilevel osteoarthritic changes. 2. Moderate posterior facet arthropathy in the lower lumbosacral spine. Electronically Signed   By: DFidela SalisburyM.D.   On: 11/18/2021 18:16    ASSESSMENT & PLAN:   71y.o. very pleasant male with  1. Painless Hematuria***  Plan -I discussed available labs with the patient in details -Patient's chart reviewed in detail. -We will get labs today to evaluate his current status further and for treatment planning.  Follow up: ***  All of the patients questions were answered with apparent satisfaction. The patient knows to call the clinic with any problems, questions or concerns.  I spent {CHL ONC TIME VISIT - SDSKAJ:[id number]}counseling the patient face to face. The total time spent in the appointment was {CHL ONC TIME VISIT - SBTDHR:[id number]}and more than 50% was on counseling and direct patient cares.    GSullivan LoneMD MOrwigsburgAAHIVMS STruman [facility] 2 CenterCPortland ClinicHematology/Oncology Physician CCascade [facility] (Office):       [phone](Work cell):  [phone](Fax):           [phone] I, GMelene Muller am acting as scribe for GFabienne Bruns MD.

## 2021-12-18 NOTE — Progress Notes (Signed)
Blood stooped at 1444pm per MD patient is having a temperature of 100.4 . A transfusion work up was ordered. Patient is stable and vitals done. Will continue to monitor patient.

## 2021-12-18 NOTE — Progress Notes (Signed)
PROGRESS NOTE    Tyler [name]  RFX:[id number] DOB: 1950/11/17 DOA: 12/10/2021 PCP: [name], FNP    Brief Narrative:  This 71 y.o. male with past medical history of prostate cancer status post prostatectomy in 2021, history of bilateral pulmonary embolism after his surgery in 2021, essential hypertension presented with worsening back pain radiating down to right leg along with fevers and hematuria.  On presentation, his hemoglobin was 6.3. He was started on broad-spectrum antibiotics for possible UTI.  Subsequently, oncology was consulted for pancytopenia and neurosurgery was consulted for lumbosacral stenosis and severe lower back pain.  He underwent bone marrow biopsy by IR on 12/14/2021. Awaiting biopsy report.  Assessment & Plan:   Principal Problem:   UTI (urinary tract infection) Active Problems:   Prostate cancer ([place])   Hypertension   Sepsis ([place])   Normocytic anemia   Thrombocytopenia ([facility])   Hyponatremia   Sepsis secondary to UTI ([facility])   Symptomatic anemia  Sepsis : POA / UTI : POA He presented with a UA: Nitrite+, no pyuria only few bacteria. Blood and urine cultures negative so far. Completed cefepime for 7 days total.  Remains afebrile for last 48 hours..  Vancomycin discontinued on 12/12/2021.   -Has significantly elevated CRP and has pancytopenia.  Unclear if the fever is from an underlying hematological process. -CT of abdomen and pelvis was negative for any acute findings. -MRI of lumbar spine did not show signs of osteomyelitis. -2D echo no evidence of any infection.  LVEF 75%.   CTA chest was negative for PE or any other acute abnormality.   Pancytopenia /Symptomatic anemia: -Status post 3 units PRBC transfusion during this hospitalization, most recent being on 12/14/2021.   Hemoglobin 6.9 this morning.  Monitor H&H.  No overt signs of bleeding. -Oncology following.  Status post bone marrow biopsy by IR on 12/14/2021.  Follow pathology. -Normal  vitamin B12 and folate levels. -No overt signs of bleeding.  Platelets 36 today.   Abdominal pain: -Lipase normal.  LFTs almost unremarkable.   CT of abdomen and pelvis did not show any acute findings.   Continue PPI for now.  Abdominal pain improving.   Severe lower back pain radiating to her right lower extremity Lumbosacral stenosis: -Still having intermittent significant lower back pain radiating to right lower extremity.   MRI of lumbar spine showed moderate to severe thecal sac narrowing posterior to L5 and severe thecal sac narrowing of L5-S1.   -Neurosurgery evaluation appreciated: Patient is not an operative candidate at this time.   Neurosurgery does not believe that patient has severe lumbosacral stenosis and it is unlikely that epidural lipomatosis is responsible for such severe pain. -PT recommends outpatient PT -Continue as needed Tylenol.  Add Lidoderm patch and as needed tramadol.   History of bilateral PE in 2021 -He was on Eliquis up until 2 to 3 months ago when it was discontinued by his urologist.   - It was a provoked PE after his prostatectomy in 2021.   ETOH Abuse: -Heavy consumption in the last 2 to 3 months per patient's daughter.   No evidence of withdrawal currently.  Continue CIWA protocol.   Continue thiamine, multivitamins and folic acid.   History of prostate cancer status post prostatectomy in 2021 -Followed by Dr. [name] with urology.  PSA was undetectable earlier this month.   Results available in care everywhere.   Obesity -Outpatient follow-up Estimated body mass index is 28.9 kg/m as calculated from the following:   Height  as of this encounter: 5' 7"  (1.702 m).   Weight as of this encounter: 83.7 kg.     DVT prophylaxis: SCDs Code Status: Full code Family Communication: Wife at bed side. Disposition Plan:   Status is: Inpatient Remains inpatient appropriate because: severity of illness.   Consultants:  Oncology  Procedures: Marrow  biopsy Antimicrobials:  Anti-infectives (From admission, onward)    Start     Dose/Rate Route Frequency Ordered Stop   12/11/21 1445  vancomycin (VANCOCIN) IVPB 1000 mg/200 mL premix  Status:  Discontinued       See Hyperspace for full Linked Orders Report.   1,000 mg 200 mL/hr over 60 Minutes Intravenous Every 24 hours 12/10/21 1437 12/12/21 1248   12/10/21 1445  ceFEPIme (MAXIPIME) 2 g in sodium chloride 0.9 % 100 mL IVPB        2 g 200 mL/hr over 30 Minutes Intravenous Every 8 hours 12/10/21 1437 12/16/21 2230   12/10/21 1445  vancomycin (VANCOREADY) IVPB 1750 mg/350 mL       See Hyperspace for full Linked Orders Report.   1,750 mg 175 mL/hr over 120 Minutes Intravenous  Once 12/10/21 1437 12/10/21 1709       Subjective: Patient was seen and examined at bedside.  Overnight events noted. Patient complains of intermittent lower back pain radiating to the right leg.   Patient spiked fever while getting blood transfusion.  Transfusion has been held, He is concerned why he is getting anemic,  there is no visible signs of bleeding  Objective: Vitals:   12/18/21 0923 12/18/21 1341 12/18/21 1404 12/18/21 1433  BP: 134/77 123/78 120/78 114/74  Pulse: 94 91 90 86  Resp: 18 18 18 20   Temp: 99.8 F (37.7 C) 98.3 F (36.8 C) 99.6 F (37.6 C) (!) 100.4 F (38 C)  TempSrc: Oral Oral Oral Oral  SpO2: 100% 100% 99% 100%  Weight:      Height:        Intake/Output Summary (Last 24 hours) at 12/18/2021 1446 Last data filed at 12/18/2021 1300 Gross per 24 hour  Intake 720 ml  Output 1 ml  Net 719 ml   Filed Weights   12/15/21 0225 12/16/21 0500 12/17/21 0500  Weight: 83.6 kg 82.9 kg 83.7 kg    Examination:  General exam: chronically ill looking and deconditioned.  Appears comfortable, NAD Respiratory system: CTA bilaterally, no wheezing, no crackles, normal respiratory effort. Cardiovascular system: S1 & S2 heard, regular rate and rhythm, no murmur. Gastrointestinal system:  Abdomen is soft, non tender, non distended, BS+ Central nervous system: Alert and oriented x 3. No focal neurological deficits. Extremities: No edema, no cyanosis, no clubbing. Skin: No rashes, lesions or ulcers Psychiatry: Judgement and insight appear normal. Mood & affect appropriate.     Data Reviewed: I have personally reviewed following labs and imaging studies  CBC: Recent Labs  Lab 12/13/21 0209 12/14/21 0421 12/14/21 1014 12/15/21 0305 12/16/21 0220 12/17/21 0328 12/18/21 0444  WBC 2.6* 2.3*  --  2.8* 2.2* 2.5* 2.2*  NEUTROABS 1.5* 1.2*  --  1.6* 1.2* 1.4*  --   HGB 7.3* 6.8* 7.1* 8.3* 7.2* 7.1* 6.9*  HCT 22.4* 21.9* 21.9* 25.1* 21.7* 22.0* 20.8*  MCV 88.2 90.5  --  87.8 88.6 89.1 87.8  PLT 43* 43*  --  39* 35* 37* 36*   Basic Metabolic Panel: Recent Labs  Lab 12/13/21 0209 12/14/21 0421 12/15/21 0305 12/16/21 0220 12/17/21 0328  NA 135 135 136 134* 134*  K 4.2 3.9 4.0 4.0 4.1  CL 106 105 105 101 103  CO2 20* 19* 22 20* 21*  GLUCOSE 150* 117* 106* 110* 104*  BUN 13 13 17 16 17   CREATININE 1.16 0.99 0.93 0.99 0.99  CALCIUM 8.0* 8.2* 8.3* 8.2* 8.2*  MG 1.9 1.7 1.8 1.7 1.9   GFR: Estimated Creatinine Clearance: 72.8 mL/min (by C-G formula based on SCr of 0.99 mg/dL). Liver Function Tests: Recent Labs  Lab 12/12/21 0254 12/13/21 0209 12/14/21 0421 12/15/21 0305 12/16/21 0220  AST 29 30 38 60* 39  ALT 25 22 26  43 36  ALKPHOS 97 107 119 119 107  BILITOT 1.9* 1.2 1.5* 1.8* 1.6*  PROT 5.9* 5.2* 5.1* 5.5* 5.1*  ALBUMIN 2.3* 2.0* 2.0* 2.2* 2.0*   No results for input(s): "LIPASE", "AMYLASE" in the last 168 hours. No results for input(s): "AMMONIA" in the last 168 hours. Coagulation Profile: No results for input(s): "INR", "PROTIME" in the last 168 hours.  Cardiac Enzymes: No results for input(s): "CKTOTAL", "CKMB", "CKMBINDEX", "TROPONINI" in the last 168 hours. BNP (last 3 results) No results for input(s): "PROBNP" in the last 8760  hours. HbA1C: No results for input(s): "HGBA1C" in the last 72 hours. CBG: No results for input(s): "GLUCAP" in the last 168 hours. Lipid Profile: No results for input(s): "CHOL", "HDL", "LDLCALC", "TRIG", "CHOLHDL", "LDLDIRECT" in the last 72 hours. Thyroid Function Tests: No results for input(s): "TSH", "T4TOTAL", "FREET4", "T3FREE", "THYROIDAB" in the last 72 hours. Anemia Panel: No results for input(s): "VITAMINB12", "FOLATE", "FERRITIN", "TIBC", "IRON", "RETICCTPCT" in the last 72 hours. Sepsis Labs: Recent Labs  Lab 12/12/21 0254 12/13/21 0209  PROCALCITON 0.16 0.24    Recent Results (from the past 240 hour(s))  Resp Panel by RT-PCR (Flu A&B, Covid) Anterior Nasal Swab     Status: None   Collection Time: 12/10/21 12:16 PM   Specimen: Anterior Nasal Swab  Result Value Ref Range Status   SARS Coronavirus 2 by RT PCR NEGATIVE NEGATIVE Final    Comment: (NOTE) SARS-CoV-2 target nucleic acids are NOT DETECTED.  The SARS-CoV-2 RNA is generally detectable in upper respiratory specimens during the acute phase of infection. The lowest concentration of SARS-CoV-2 viral copies this assay can detect is 138 copies/mL. A negative result does not preclude SARS-Cov-2 infection and should not be used as the sole basis for treatment or other patient management decisions. A negative result may occur with  improper specimen collection/handling, submission of specimen other than nasopharyngeal swab, presence of viral mutation(s) within the areas targeted by this assay, and inadequate number of viral copies(<138 copies/mL). A negative result must be combined with clinical observations, patient history, and epidemiological information. The expected result is Negative.  Fact Sheet for Patients:  EntrepreneurPulse.com.au  Fact Sheet for Healthcare Providers:  IncredibleEmployment.be  This test is no t yet approved or cleared by the [place] FDA and   has been authorized for detection and/or diagnosis of SARS-CoV-2 by FDA under an Emergency Use Authorization (EUA). This EUA will remain  in effect (meaning this test can be used) for the duration of the COVID-19 declaration under Section 564(b)(1) of the Act, 21 U.S.C.section 360bbb-3(b)(1), unless the authorization is terminated  or revoked sooner.       Influenza A by PCR NEGATIVE NEGATIVE Final   Influenza B by PCR NEGATIVE NEGATIVE Final    Comment: (NOTE) The Xpert Xpress SARS-CoV-2/FLU/RSV plus assay is intended as an aid in the diagnosis of influenza from Nasopharyngeal swab specimens and should  not be used as a sole basis for treatment. Nasal washings and aspirates are unacceptable for Xpert Xpress SARS-CoV-2/FLU/RSV testing.  Fact Sheet for Patients: EntrepreneurPulse.com.au  Fact Sheet for Healthcare Providers: IncredibleEmployment.be  This test is not yet approved or cleared by the [place] FDA and has been authorized for detection and/or diagnosis of SARS-CoV-2 by FDA under an Emergency Use Authorization (EUA). This EUA will remain in effect (meaning this test can be used) for the duration of the COVID-19 declaration under Section 564(b)(1) of the Act, 21 U.S.C. section 360bbb-3(b)(1), unless the authorization is terminated or revoked.  Performed at [facility], [place] [address] [place], [place] [address]   Urine Culture     Status: None   Collection Time: 12/11/21  6:55 AM   Specimen: Urine, Clean Catch  Result Value Ref Range Status   Specimen Description URINE, CLEAN CATCH  Final   Special Requests NONE  Final   Culture   Final    NO GROWTH Performed at [facility], [place] [address] [place], [place] [address]    Report Status 12/12/2021 FINAL  Final  Culture, blood (Routine X 2) w Reflex to ID Panel     Status: None   Collection Time: 12/11/21  6:55 AM   Specimen: BLOOD RIGHT HAND  Result Value Ref  Range Status   Specimen Description BLOOD RIGHT HAND  Final   Special Requests   Final    BOTTLES DRAWN AEROBIC AND ANAEROBIC Blood Culture adequate volume   Culture   Final    NO GROWTH 5 DAYS Performed at [facility], [place] [address], [place], [place] [address]    Report Status 12/16/2021 FINAL  Final  Culture, blood (Routine X 2) w Reflex to ID Panel     Status: None   Collection Time: 12/11/21  6:02 PM   Specimen: BLOOD  Result Value Ref Range Status   Specimen Description BLOOD LEFT ANTECUBITAL  Final   Special Requests   Final    BOTTLES DRAWN AEROBIC AND ANAEROBIC Blood Culture adequate volume   Culture   Final    NO GROWTH 5 DAYS Performed at [facility], [place] [address] [place], [place] [address]    Report Status 12/16/2021 FINAL  Final    Radiology Studies: No results found.  Scheduled Meds:  sodium chloride   Intravenous Once   B-complex with vitamin C  1 tablet Oral Daily   feeding supplement  237 mL Oral BID BM   folic acid  1 mg Oral Daily   lidocaine  1 patch Transdermal Q24H   metoprolol tartrate  12.5 mg Oral BID   multivitamin with minerals  1 tablet Oral Daily   pantoprazole (PROTONIX) IV  40 mg Intravenous Q12H   senna-docusate  1 tablet Oral BID   thiamine  100 mg Oral Daily   Continuous Infusions:   LOS: 8 days    Time spent: 35 mins    Avree Szczygiel, MD [facility]   If 7PM-7AM, please contact night-coverage

## 2021-12-18 NOTE — Progress Notes (Signed)
On call provider notified of pt critical Hgb being 6.9, no new orders at this time, will continue to monitor pt

## 2021-12-19 ENCOUNTER — Inpatient Hospital Stay (HOSPITAL_COMMUNITY): Payer: 59

## 2021-12-19 DIAGNOSIS — R509 Fever, unspecified: Secondary | ICD-10-CM | POA: Diagnosis not present

## 2021-12-19 LAB — SURGICAL PATHOLOGY

## 2021-12-19 LAB — CBC
HCT: 28.6 % — ABNORMAL LOW (ref 39.0–52.0)
Hemoglobin: 9.3 g/dL — ABNORMAL LOW (ref 13.0–17.0)
MCH: 28.8 pg (ref 26.0–34.0)
MCHC: 32.5 g/dL (ref 30.0–36.0)
MCV: 88.5 fL (ref 80.0–100.0)
Platelets: 41 10*3/uL — ABNORMAL LOW (ref 150–400)
RBC: 3.23 MIL/uL — ABNORMAL LOW (ref 4.22–5.81)
RDW: 19.1 % — ABNORMAL HIGH (ref 11.5–15.5)
WBC: 2.5 10*3/uL — ABNORMAL LOW (ref 4.0–10.5)
nRBC: 3.6 % — ABNORMAL HIGH (ref 0.0–0.2)

## 2021-12-19 LAB — PROCALCITONIN: Procalcitonin: 0.18 ng/mL

## 2021-12-19 MED ORDER — IPRATROPIUM-ALBUTEROL 0.5-2.5 (3) MG/3ML IN SOLN: 3.0000 mL | RESPIRATORY_TRACT | Status: DC | PRN

## 2021-12-19 MED ORDER — TRAZODONE HCL 50 MG PO TABS: 50.0000 mg | ORAL_TABLET | Freq: Every evening | ORAL | Status: DC | PRN

## 2021-12-19 MED ORDER — GUAIFENESIN 100 MG/5ML PO LIQD: 5.0000 mL | ORAL | Status: DC | PRN

## 2021-12-19 MED ORDER — PANTOPRAZOLE SODIUM 40 MG PO TBEC
40.0000 mg | DELAYED_RELEASE_TABLET | Freq: Two times a day (BID) | ORAL | Status: DC
  Administered 2021-12-19 – 2021-12-20 (×3): 40 mg via ORAL
  Filled 2021-12-19 (×3): qty 1

## 2021-12-19 MED ORDER — METOPROLOL TARTRATE 5 MG/5ML IV SOLN: 5.0000 mg | INTRAVENOUS | Status: DC | PRN

## 2021-12-19 MED ORDER — HYDRALAZINE HCL 20 MG/ML IJ SOLN: 10.0000 mg | INTRAMUSCULAR | Status: DC | PRN

## 2021-12-19 NOTE — Progress Notes (Incomplete)
HEMATOLOGY/ONCOLOGY INPATIENT PROGRESS NOTE  Date of Service: 12/19/2021  Inpatient Attending: .Damita Lack, MD   SUBJECTIVE  ***    OBJECTIVE:  ***  PHYSICAL EXAMINATION: . Vitals:   12/18/21 1831 12/18/21 2147 12/19/21 0532 12/19/21 0849  BP: 128/75 (!) 149/87 127/81 122/81  Pulse: 88 84 94 86  Resp: 20 16 20    Temp: 98.7 F (37.1 C) 98.2 F (36.8 C) (!) 100.5 F (38.1 C) 97.6 F (36.4 C)  TempSrc: Oral Oral Oral Oral  SpO2: 100% 100% 100% 100%  Weight:      Height:       Filed Weights   12/15/21 0225 12/16/21 0500 12/17/21 0500  Weight: 83.6 kg 82.9 kg 83.7 kg   .Body mass index is 28.9 kg/m.  GENERAL:alert, in no acute distress and comfortable SKIN: skin color, texture, turgor are normal, no rashes or significant lesions EYES: normal, conjunctiva are pink and non-injected, sclera clear OROPHARYNX:no exudate, no erythema and lips, buccal mucosa, and tongue normal  NECK: supple, no JVD, thyroid normal size, non-tender, without nodularity LYMPH:  no palpable lymphadenopathy in the cervical, axillary or inguinal LUNGS: clear to auscultation with normal respiratory effort HEART: regular rate & rhythm,  no murmurs and no lower extremity edema ABDOMEN: abdomen soft, non-tender, normoactive bowel sounds  Musculoskeletal: no cyanosis of digits and no clubbing  PSYCH: alert & oriented x 3 with fluent speech NEURO: no focal motor/sensory deficits  MEDICAL HISTORY:  Past Medical History:  Diagnosis Date   Cancer (Tyler [name])    prostate   Hypertension     SURGICAL HISTORY: Past Surgical History:  Procedure Laterality Date   LYMPHADENECTOMY Bilateral 09/01/2019   Procedure: LYMPHADENECTOMY;  Surgeon: Alexis Frock, MD;  Location: [facility];  Service: Urology;  Laterality: Bilateral;   ROBOT ASSISTED LAPAROSCOPIC RADICAL PROSTATECTOMY N/A 09/01/2019   Procedure: XI ROBOTIC ASSISTED LAPAROSCOPIC RADICAL PROSTATECTOMY;  Surgeon: Alexis Frock, MD;   Location: [facility];  Service: Urology;  Laterality: N/A;  3 HRS    SOCIAL HISTORY: Social History   Socioeconomic History   Marital status: Married    Spouse name: Not on file   Number of children: Not on file   Years of education: Not on file   Highest education level: Not on file  Occupational History   Not on file  Tobacco Use   Smoking status: Never   Smokeless tobacco: Never  Vaping Use   Vaping Use: Never used  Substance and Sexual Activity   Alcohol use: Yes    Comment: at least one 40 oz beer daily   Drug use: Never   Sexual activity: Not on file  Other Topics Concern   Not on file  Social History Narrative   Not on file   Social Determinants of Health   Financial Resource Strain: Not on file  Food Insecurity: No Food Insecurity (12/18/2021)   Hunger Vital Sign    Worried About Running Out of Food in the Last Year: Never true    Ran Out of Food in the Last Year: Never true  Transportation Needs: No Transportation Needs (12/18/2021)   PRAPARE - Hydrologist (Medical): No    Lack of Transportation (Non-Medical): No  Physical Activity: Not on file  Stress: Not on file  Social Connections: Not on file  Intimate Partner Violence: Not At Risk (12/18/2021)   Humiliation, Afraid, Rape, and Kick questionnaire    Fear of Current or Ex-Partner: No    Emotionally Abused:  No    Physically Abused: No    Sexually Abused: No    FAMILY HISTORY: Family History  Problem Relation Age of Onset   Stroke Father     ALLERGIES:  is allergic to amlodipine.  MEDICATIONS:  Scheduled Meds:  B-complex with vitamin C  1 tablet Oral Daily   feeding supplement  237 mL Oral BID BM   folic acid  1 mg Oral Daily   lidocaine  1 patch Transdermal Q24H   metoprolol tartrate  12.5 mg Oral BID   multivitamin with minerals  1 tablet Oral Daily   pantoprazole  40 mg Oral BID   senna-docusate  1 tablet Oral BID   thiamine  100 mg Oral Daily   Continuous  Infusions: PRN Meds:.acetaminophen **OR** acetaminophen, guaiFENesin, hydrALAZINE, ipratropium-albuterol, metoprolol tartrate, ondansetron **OR** ondansetron (ZOFRAN) IV, mouth rinse, polyethylene glycol, traMADol, traZODone  REVIEW OF SYSTEMS:    10 Point review of Systems was done is negative except as noted above.   LABORATORY DATA:  I have reviewed the data as listed  .    Latest Ref Rng & Units 12/19/2021    7:50 AM 12/18/2021    4:44 AM 12/17/2021    3:28 AM  CBC  WBC 4.0 - 10.5 K/uL 2.5  2.2  2.5   Hemoglobin 13.0 - 17.0 g/dL 9.3  6.9  7.1   Hematocrit 39.0 - 52.0 % 28.6  20.8  22.0   Platelets 150 - 400 K/uL 41  36  37     .    Latest Ref Rng & Units 12/17/2021    3:28 AM 12/16/2021    2:20 AM 12/15/2021    3:05 AM  CMP  Glucose 70 - 99 mg/dL 104  110  106   BUN 8 - 23 mg/dL 17  16  17    Creatinine 0.61 - 1.24 mg/dL 0.99  0.99  0.93   Sodium 135 - 145 mmol/L 134  134  136   Potassium 3.5 - 5.1 mmol/L 4.1  4.0  4.0   Chloride 98 - 111 mmol/L 103  101  105   CO2 22 - 32 mmol/L 21  20  22    Calcium 8.9 - 10.3 mg/dL 8.2  8.2  8.3   Total Protein 6.5 - 8.1 g/dL  5.1  5.5   Total Bilirubin 0.3 - 1.2 mg/dL  1.6  1.8   Alkaline Phos 38 - 126 U/L  107  119   AST 15 - 41 U/L  39  60   ALT 0 - 44 U/L  36  43      RADIOGRAPHIC STUDIES: I have personally reviewed the radiological images as listed and agreed with the findings in the report. DG Chest Port 1 View  Result Date: 12/19/2021 CLINICAL DATA:  Shortness of breath EXAM: PORTABLE CHEST 1 VIEW COMPARISON:  12/10/2021 FINDINGS: Heart size upper limits of normal. Aortic tortuosity. Poor inspiration. Allowing for that the lungs are clear. The vascularity is normal. No effusions. IMPRESSION: Poor inspiration. No active disease suspected. Aortic tortuosity. Electronically Signed   By: Nelson Chimes M.D.   On: 12/19/2021 09:34   ECHOCARDIOGRAM COMPLETE  Result Date: 12/16/2021    ECHOCARDIOGRAM REPORT   Patient Name:    Tyler [name] Date of Exam: 12/16/2021 Medical Rec #:  [id number]    Height:       67.0 in Accession #:    [id number]   Weight:       182.8 lb Date  of Birth:  05/25/1950    BSA:          1.946 m Patient Age:    71 years     BP:           123/71 mmHg Patient Gender: M            HR:           99 bpm. Exam Location:  Inpatient Procedure: 2D Echo, Cardiac Doppler, Color Doppler and Intracardiac            Opacification Agent Indications:    Fever R50.9  History:        Patient has prior history of Echocardiogram examinations, most                 recent 09/06/2019. Sepsis; Risk Factors:Hypertension and                 Non-Smoker.  Sonographer:    Tyler [name] Referring Phys: [id number] [facility]  Sonographer Comments: Technically difficult study due to poor echo windows and suboptimal apical window. Image acquisition challenging due to patient body habitus and Image acquisition challenging due to respiratory motion. IMPRESSIONS  1. Left ventricular ejection fraction, by estimation, is >75%. The left ventricle has hyperdynamic function. The left ventricle has no regional wall motion abnormalities. There is mild left ventricular hypertrophy. Left ventricular diastolic parameters are consistent with Grade I diastolic dysfunction (impaired relaxation).  2. Right ventricular systolic function is normal. The right ventricular size is normal. Tricuspid regurgitation signal is inadequate for assessing PA pressure.  3. The mitral valve is normal in structure. No evidence of mitral valve regurgitation. No evidence of mitral stenosis.  4. The aortic valve has an indeterminant number of cusps. Aortic valve regurgitation is not visualized. No aortic stenosis is present.  5. The inferior vena cava is normal in size with greater than 50% respiratory variability, suggesting right atrial pressure of 3 mmHg. FINDINGS  Left Ventricle: Left ventricular ejection fraction, by estimation, is >75%. The left ventricle has hyperdynamic function.  The left ventricle has no regional wall motion abnormalities. The left ventricular internal cavity size was normal in size. There is mild left ventricular hypertrophy. Left ventricular diastolic parameters are consistent with Grade I diastolic dysfunction (impaired relaxation). Normal left ventricular filling pressure. Right Ventricle: The right ventricular size is normal. Right vetricular wall thickness was not well visualized. Right ventricular systolic function is normal. Tricuspid regurgitation signal is inadequate for assessing PA pressure. Left Atrium: Left atrial size was normal in size. Right Atrium: Right atrial size was not well visualized. Pericardium: There is no evidence of pericardial effusion. Mitral Valve: The mitral valve is normal in structure. No evidence of mitral valve regurgitation. No evidence of mitral valve stenosis. Tricuspid Valve: The tricuspid valve is normal in structure. Tricuspid valve regurgitation is not demonstrated. No evidence of tricuspid stenosis. Aortic Valve: The aortic valve has an indeterminant number of cusps. Aortic valve regurgitation is not visualized. No aortic stenosis is present. Aortic valve mean gradient measures 4.0 mmHg. Aortic valve peak gradient measures 10.0 mmHg. Aortic valve area, by VTI measures 2.52 cm. Pulmonic Valve: The pulmonic valve was not well visualized. Pulmonic valve regurgitation is not visualized. No evidence of pulmonic stenosis. Aorta: The aortic root and ascending aorta are structurally normal, with no evidence of dilitation. Venous: The inferior vena cava is normal in size with greater than 50% respiratory variability, suggesting right atrial pressure of 3 mmHg. IAS/Shunts: No atrial level shunt  detected by color flow Doppler.  LEFT VENTRICLE PLAX 2D LVIDd:         3.10 cm   Diastology LVIDs:         1.80 cm   LV e' medial:    10.40 cm/s LV PW:         1.20 cm   LV E/e' medial:  4.5 LV IVS:        1.00 cm   LV e' lateral:   10.90 cm/s LVOT  diam:     2.10 cm   LV E/e' lateral: 4.2 LV SV:         61 LV SV Index:   31 LVOT Area:     3.46 cm  RIGHT VENTRICLE RV S prime:     23.60 cm/s TAPSE (M-mode): 1.8 cm LEFT ATRIUM             Index        RIGHT ATRIUM           Index LA diam:        2.60 cm 1.34 cm/m   RA Area:     11.70 cm LA Vol (A2C):   66.9 ml 34.37 ml/m  RA Volume:   24.20 ml  12.43 ml/m LA Vol (A4C):   36.8 ml 18.91 ml/m LA Biplane Vol: 52.3 ml 26.87 ml/m  AORTIC VALVE AV Area (Vmax):    2.43 cm AV Area (Vmean):   2.92 cm AV Area (VTI):     2.52 cm AV Vmax:           158.36 cm/s AV Vmean:          88.501 cm/s AV VTI:            0.243 m AV Peak Grad:      10.0 mmHg AV Mean Grad:      4.0 mmHg LVOT Vmax:         111.00 cm/s LVOT Vmean:        74.600 cm/s LVOT VTI:          0.177 m LVOT/AV VTI ratio: 0.73  AORTA Ao Root diam: 3.30 cm Ao Asc diam:  3.40 cm MITRAL VALVE MV Area (PHT): 4.06 cm    SHUNTS MV Decel Time: 187 msec    Systemic VTI:  0.18 m MV E velocity: 46.30 cm/s  Systemic Diam: 2.10 cm MV A velocity: 75.80 cm/s MV E/A ratio:  0.61 Tyler Dolly MD Electronically signed by Tyler Dolly MD Signature Date/Time: 12/16/2021/12:16:02 PM    Final    CT Angio Chest Pulmonary Embolism (PE) W or WO Contrast  Result Date: 12/15/2021 CLINICAL DATA:  Pulmonary embolism (PE) suspected, high prob EXAM: CT ANGIOGRAPHY CHEST WITH CONTRAST TECHNIQUE: Multidetector CT imaging of the chest was performed using the standard protocol during bolus administration of intravenous contrast. Multiplanar CT image reconstructions and MIPs were obtained to evaluate the vascular anatomy. RADIATION DOSE REDUCTION: This exam was performed according to the departmental dose-optimization program which includes automated exposure control, adjustment of the mA and/or kV according to patient size and/or use of iterative reconstruction technique. CONTRAST:  62m OMNIPAQUE IOHEXOL 350 MG/ML SOLN COMPARISON:  X-ray 12/10/2021.  CT 09/05/2019 FINDINGS:  Cardiovascular: Satisfactory opacification of the pulmonary arteries to the segmental level. No evidence of pulmonary embolism. Thoracic aorta is nonaneurysmal. Normal heart size. No pericardial effusion. Mediastinum/Nodes: No enlarged mediastinal, hilar, or axillary lymph nodes. Thyroid gland, trachea, and esophagus demonstrate no significant findings. Lungs/Pleura: No airspace consolidation. No  pleural effusion or pneumothorax. Upper Abdomen: No acute abnormality. Musculoskeletal: No chest wall abnormality. No acute or significant osseous findings. Review of the MIP images confirms the above findings. IMPRESSION: No evidence of pulmonary embolism or other acute intrathoracic findings. Electronically Signed   By: Davina Poke D.O.   On: 12/15/2021 16:21   CT BONE MARROW BIOPSY & ASPIRATION  Result Date: 12/14/2021 INDICATION: Pancytopenia EXAM: CT BONE MARROW BIOPSY AND ASPIRATION MEDICATIONS: None. ANESTHESIA/SEDATION: Moderate (conscious) sedation was employed during this procedure. A total of Versed 1.5 mg and Fentanyl 75 mcg was administered intravenously. Moderate Sedation Time: 11 minutes. The patient's level of consciousness and vital signs were monitored continuously by radiology nursing throughout the procedure under my direct supervision. FLUOROSCOPY TIME:  N/a COMPLICATIONS: None immediate. PROCEDURE: Informed written consent was obtained from the patient after a thorough discussion of the procedural risks, benefits and alternatives. All questions were addressed. Maximal Sterile Barrier Technique was utilized including caps, mask, sterile gowns, sterile gloves, sterile drape, hand hygiene and skin antiseptic. A timeout was performed prior to the initiation of the procedure. The patient was placed prone on the CT exam table. Limited CT of the pelvis was performed for planning purposes. Skin entry site was marked, and the overlying skin was prepped and draped in the standard sterile fashion. Local  analgesia was obtained with 1% lidocaine. Using CT guidance, an 11 gauge needle was advanced just deep to the cortex of the right posterior ilium. Subsequently, bone marrow aspiration and core biopsy were performed. Specimens were submitted to lab/pathology for handling. Hemostasis was achieved with manual pressure, and a clean dressing was placed. The patient tolerated the procedure well without immediate complication. IMPRESSION: Successful CT-guided bone marrow aspiration and core biopsy of the right posterior ilium. Electronically Signed   By: Albin Felling M.D.   On: 12/14/2021 10:24   MR LUMBAR SPINE WO CONTRAST  Result Date: 12/11/2021 CLINICAL DATA:  Low back pain EXAM: MRI LUMBAR SPINE WITHOUT CONTRAST TECHNIQUE: Multiplanar, multisequence MR imaging of the lumbar spine was performed. No intravenous contrast was administered. COMPARISON:  No prior MRI of the lumbar spine, correlation is made with 11/18/2021 lumbar spine radiographs and 12/10/2021 CT abdomen pelvis FINDINGS: Segmentation:  5 lumbar-type vertebral bodies. Alignment:  Mild levocurvature.  No listhesis. Vertebrae: No acute fracture or suspicious osseous lesion. Diffusely decreased marrow signal, which on T1 is slightly hyperintense to skeletal muscle, likely red marrow reconversion. Speckled marrow signal in the bilateral iliac bones and, to a lesser extent, vertebral bodies, which is favored to be benign given its appearance on the 12/10/2021 CT. Conus medullaris and cauda equina: Conus extends to the T12-L1 level. Conus and cauda equina appear normal. Paraspinal and other soft tissues: Right renal cyst, better evaluated on the prior CT, for which no follow-up is currently indicated. Disc levels: T12-L1: Disc desiccation without significant disc bulge. No spinal canal stenosis or neural foraminal narrowing. L1-L2: No significant disc bulge. No spinal canal stenosis or neural foraminal narrowing. L2-L3: Disc desiccation and minimal disc  bulge with central annular fissure. Mild facet arthropathy. No spinal canal stenosis or neural foraminal narrowing. L3-L4: Disc desiccation and minimal disc bulge with left foraminal protrusion. Mild facet arthropathy. No spinal canal stenosis or neural foraminal narrowing. L4-L5: Disc desiccation and minimal disc bulge. Mild facet arthropathy. Epidural lipomatosis causes mild thecal sac narrowing at the disc level and moderate to severe thecal sac narrowing posterior to L5. No neural foraminal narrowing. L5-S1: No significant disc bulge. Mild facet  arthropathy. Epidural lipomatosis, which causes severe thecal sac narrowing. No neural foraminal narrowing. IMPRESSION: 1. 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 No neural foraminal narrowing. 2. Diffusely decreased marrow signal, slightly hyperintense to skeletal muscle, likely red marrow reconversion. Additional speckled marrow signal in the bilateral iliac bones and, to a lesser extent, vertebral bodies, is favored to be benign given its appearance on the 12/10/2021 CT. A diffuse infiltrative bone marrow process is felt to be less likely. Consider correlation with serum labs. Electronically Signed   By: Merilyn Baba M.D.   On: 12/11/2021 13:07   CT ABDOMEN PELVIS W CONTRAST  Result Date: 12/10/2021 CLINICAL DATA:  LEFT lower quadrant abdominal pain.  Sepsis EXAM: CT ABDOMEN AND PELVIS WITH CONTRAST TECHNIQUE: Multidetector CT imaging of the abdomen and pelvis was performed using the standard protocol following bolus administration of intravenous contrast. RADIATION DOSE REDUCTION: This exam was performed according to the departmental dose-optimization program which includes automated exposure control, adjustment of the mA and/or kV according to patient size and/or use of iterative  reconstruction technique. CONTRAST:  55m OMNIPAQUE IOHEXOL 350 MG/ML SOLN COMPARISON:  None Available. FINDINGS: Lower chest: Lung bases are clear. Hepatobiliary: No focal hepatic lesion. No biliary duct dilatation. Common bile duct is normal. Pancreas: Pancreas is normal. No ductal dilatation. No pancreatic inflammation. Spleen: Normal spleen Adrenals/urinary tract: Adrenal glands normal. Bilateral simple fluid attenuation renal cysts. No follow-up recommended for Bosniak 1 renal cysts. Ureters and bladder normal. Stomach/Bowel: Stomach, small bowel, appendix, and cecum are normal. The colon and rectosigmoid colon are normal. Vascular/Lymphatic: Abdominal aorta is normal caliber. No periportal or retroperitoneal adenopathy. No pelvic adenopathy. Reproductive: Post prostatectomy Other: Prior LEFT inguinal hernia repair.  No evidence recurrence. Musculoskeletal: No aggressive osseous lesion. IMPRESSION: 1. No acute findings abdomen pelvis. 2. Prior LEFT inguinal hernia repair without complication. 3. Bilateral benign renal cysts.  No obstructive uropathy Electronically Signed   By: SSuzy BouchardM.D.   On: 12/10/2021 16:15   DG Chest 2 View  Result Date: 12/10/2021 CLINICAL DATA:  Chest pain. EXAM: CHEST - 2 VIEW COMPARISON:  Chest x-ray 09/07/2019. FINDINGS: The heart size and mediastinal contours are within normal limits. Both lungs are clear. No visible pleural effusions or pneumothorax. No acute osseous abnormality. IMPRESSION: No active cardiopulmonary disease. Electronically Signed   By: FMargaretha SheffieldM.D.   On: 12/10/2021 12:44   CT ABDOMEN PELVIS W CONTRAST  Result Date: 11/29/2021 CLINICAL DATA:  Abdominal pain and emesis EXAM: CT ABDOMEN AND PELVIS WITH CONTRAST TECHNIQUE: Multidetector CT imaging of the abdomen and pelvis was performed using the standard protocol following bolus administration of intravenous contrast. RADIATION DOSE REDUCTION: This exam was performed according to the  departmental dose-optimization program which includes automated exposure control, adjustment of the mA and/or kV according to patient size and/or use of iterative reconstruction technique. CONTRAST:  78mOMNIPAQUE IOHEXOL 350 MG/ML SOLN COMPARISON:  CT abdomen and pelvis dated July 13th 2021 FINDINGS: Lower chest: No acute abnormality. Hepatobiliary: No focal liver abnormality is seen. No gallstones, gallbladder wall thickening, or biliary dilatation. Pancreas: Unremarkable. No pancreatic ductal dilatation or surrounding inflammatory changes. Spleen: Normal in size without focal abnormality. Adrenals/Urinary Tract: Bilateral adrenal glands are unremarkable. No hydronephrosis or nephrolithiasis. Bilateral low-attenuation renal lesions largest are compatible with simple cysts, others are too small to completely  characterize, no further follow-up imaging is recommended. Bladder is unremarkable. Stomach/Bowel: Stomach is within normal limits. Appendix appears normal. No evidence of bowel wall thickening, distention, or inflammatory changes. Vascular/Lymphatic: No significant vascular findings are present. No enlarged abdominal or pelvic lymph nodes. Reproductive: Prostate is surgically absent. Ovoid structures previously described along the pelvic sidewalls are no longer present, likely resolved hematomas. Other: No abdominal wall hernia or abnormality. No abdominopelvic ascites. Musculoskeletal: No acute or significant osseous findings. IMPRESSION: No acute findings in the abdomen or pelvis. Electronically Signed   By: Yetta Glassman M.D.   On: 11/29/2021 08:52    ASSESSMENT & PLAN:    71 year old male with history of prostate cancer status post radical prostatectomy in 2021, history of bilateral PE in 2021 related to surgery, hypertension with   #1 Microcytic anemia now normocytic with elevated reticulocyte count. Elevated LDH levels normal haptoglobin suggest against hemolysis. No hepatosplenomegaly on CT  abdomen/pelvis. PSA levels on 11/27/2021 were undetectable suggesting against myelophthisic picture. Though there is no overt findings of liver disease he does have elevated bilirubin and cannot rule out underlying liver disease. He also has been using a fair amount of ibuprofen for his back pain which can cause pancytopenia. With significant reticulocytosis cannot rule out possible GI bleeding or other blood loss even though fecal occult blood testing x1 was negative.   #2 thrombocytopenia unknown how chronic this is platelet counts were in the 70k even a few weeks ago. No elevated immature platelet fraction No schistocytes on peripheral blood   #3 leukopenia without neutropenia   Plan: -All the labs and imaging studies were reviewed in detail. -The etiology of the patient's pancytopenia could be multifactorial at this time including possible blood loss.  Ferritin levels are elevated in the context of significant inflammatory changes based on elevated sed rate and CRP.  Iron saturation is low at less than 20%. -B12 levels are within normal limits which could be overestimated if there is any liver injury. -Ibuprofen could also be a potential culprit for pancytopenia and worsening thrombocytopenia and would recommend that this be not used. -Patient also has had reportedly heavy alcohol use in the recent past consuming more than 100 ounces of beer daily which could be pan suppressive to his bone marrow. -Patient has possible urinary tract infection with elevated lactate levels suggestive of sepsis cultures currently pending sepsis could also play into his cytopenias. -With low albumin levels his nutrition is also likely limiting. -Cannot rule out the possibility of another passing viral infection like EBV or CMV. -We will start on B complex to cover any subtle deficiencies, treat underlying sepsis/infection, absolute alcohol avoidance, absolutely avoid NSAIDs. -Patient is reticulocyte in well and  hopefully will stabilize his hemoglobin levels. -Ordered and pending hemoglobin electrophoresis to rule out any underlying hemoglobinopathy. -Myeloma panel. -PSA levels were undetectable and low likelihood of a myelophthisic picture. -If the patient's pancytopenia continues to worsen or does not improve with the above then might need to consider a bone marrow examination  I spent {CHL ONC TIME VISIT - KKXFG:[id number]} counseling the patient face to face. The total time spent in the appointment was {CHL ONC TIME VISIT - CVELF:[id number]} and more than 50% was on counseling and direct patient cares.     Zettie Cooley, am acting as a scribe for Dr. Sullivan Lone, MD.  Sullivan Lone MD [facility] H[facility]/Oncology Physician [place]  (Office):       [phone] (Work cell):  [phone] (Fax):           [phone]  12/19/2021 11:49 AM

## 2021-12-19 NOTE — Progress Notes (Signed)
Nutrition Follow-up  DOCUMENTATION CODES:   Obesity unspecified  INTERVENTION:   - Continue Ensure Enlive po BID, each supplement provides 350 kcal and 20 grams of protein  - Continue MVI with minerals daily  NUTRITION DIAGNOSIS:   Inadequate oral intake related to poor appetite, vomiting, nausea as evidenced by meal completion < 50%.  Progressing, meal completions now 100%  GOAL:   Patient will meet greater than or equal to 90% of their needs  Progressing  MONITOR:   PO intake, Supplement acceptance  REASON FOR ASSESSMENT:   Malnutrition Screening Tool    ASSESSMENT:   71 year old male admits related to fever and chills. PMH includes EtOH abuse, prostate cancer, bilateral pulmonary embolism, and HTN. Pt is currently receiving medical management for sepsis/concern for UTI.  10/20 - s/p bone marrow biopsy  Spoke with pt and significant other at bedside. Noted pt with ~90% completed lunch meal tray in room. Pt and significant other report that pt's PO intake has improved dramatically. Pt is consuming oral nutrition supplements. Will continue with current nutrition interventions.  Admit weight: 88.5 kg Current weight: 83.7 kg  Meal Completion: 100% x last 7 documented meals  Medications reviewed and include: B-complex with vitamin C, Ensure Enlive BID, folic acid, MVI with minerals, protonix, senna, thiamine  Labs reviewed: sodium 134, WBC 2.5, hemoglobin 9.3, platelets 41  Diet Order:   Diet Order             Diet regular Room service appropriate? Yes; Fluid consistency: Thin  Diet effective now                   EDUCATION NEEDS:   Not appropriate for education at this time  Skin:  Skin Assessment: Reviewed RN Assessment (puncture to L hip from bone marrow biopsy)  Last BM:  12/14/21  Height:   Ht Readings from Last 1 Encounters:  12/10/21 5' 7"  (1.702 m)    Weight:   Wt Readings from Last 1 Encounters:  12/17/21 83.7 kg    Ideal Body  Weight:  67.2 kg  BMI:  Body mass index is 28.9 kg/m.  Estimated Nutritional Needs:   Kcal:  2200-2400  Protein:  110-130 gm  Fluid:  >2.0 L    Gustavus Bryant, MS, RD, LDN Inpatient Clinical Dietitian Please see AMiON for contact information.

## 2021-12-19 NOTE — Progress Notes (Signed)
Mobility Specialist Progress Note   12/19/21 1544  Mobility  Activity Ambulated with assistance in hallway; Ambulated independently to bathroom  Level of Assistance Modified independent, requires aide device or extra time  Assistive Device None  Distance Ambulated (ft) 1000 ft (+)  Range of Motion/Exercises Active;All extremities  Activity Response Tolerated well   Patient received in supine asleep and easily aroused. Agreeable to participate in mobility. Ambulated mod I with steady gait. Returned to room without complaint or incident. Was left in bathroom with all needs met, call bell in reach.   [place] Samia Kukla, [place], [place]  Office: [phone]

## 2021-12-19 NOTE — Progress Notes (Signed)
PROGRESS NOTE    Tyler [name]  XTA:[id number] DOB: 12/16/1950 DOA: 12/10/2021 PCP: [name], FNP   Brief Narrative:  71 y.o. male with past medical history of prostate cancer status post prostatectomy in 2021, history of bilateral pulmonary embolism after his surgery in 2021, essential hypertension presented with worsening back pain radiating down to right leg along with fevers and hematuria.  On presentation, his hemoglobin was 6.3. He was started on broad-spectrum antibiotics for possible UTI.  Subsequently, oncology was consulted for pancytopenia and neurosurgery was consulted for lumbosacral stenosis and severe lower back pain.  Patient is not a surgical candidate per neurosurgery.  He underwent bone marrow biopsy by IR on 12/14/2021. Awaiting biopsy report.   Assessment & Plan:  Principal Problem:   UTI (urinary tract infection) Active Problems:   Prostate cancer ([place])   Hypertension   Sepsis ([place])   Normocytic anemia   Thrombocytopenia ([facility])   Hyponatremia   Sepsis secondary to UTI ([facility])   Symptomatic anemia    Urosepsis -Urine cultures and blood cultures remain negative.  Patient completed total 7 days of cefepime.  Still off-and-on developing fever.  CT abdomen pelvis, MRI lumbar spine did not show any acute findings.  2D echocardiogram showed EF 75%, CTA chest is negative for PE. Currently patient is off antibiotics Recurrent fever= will repeat Blood cultures, check ProCal and CXR. UA is neg. I suspect fever is more transfusion related.    Pancytopenia /Symptomatic anemia: Undergone blood transfusion during recent admission and during this admission.  Normal folate and B12 levels.  Continue vitamin supplement including B complex.  Status post bone marrow biopsy 10/20, pending results.  Oncology following.    Abdominal pain: Continue PPI, LFTs, lipase unremarkable.  CT abdomen pelvis negative   Severe lower back pain radiating to her right lower  extremity Lumbosacral stenosis: -Chronic changes on the MRI.  Seen by neurosurgery, not a candidate for surgical intervention at this time.  Pain management.  PT/OT   History of bilateral PE in 2021 Previously thought to be provoked PE secondary to prostatectomy in 2021.  Treated with Eliquis   ETOH Abuse: -Heavy consumption in the last 2 to 3 months per patient's daughter.   No evidence of withdrawal currently.  Continue CIWA protocol.   Continue thiamine, multivitamins and folic acid.   History of prostate cancer status post prostatectomy in 2021 -Followed by Dr. [name] with urology.  PSA was undetectable earlier this month.   Results available in care everywhere.   Obesity -Outpatient follow-up Estimated body mass index is 28.9 kg/m as calculated from the following:   Height as of this encounter: _0  (1.702 m).   Weight as of this encounter: 83.7 kg.      DVT prophylaxis: SCDs Start: 12/10/21 1715 Code Status: Full code Family Communication:  Family at bedside.   Status is: Inpatient Remains inpatient appropriate because: Pending Bone Bx results. Monitor Hb and plts.    Nutritional status    Signs/Symptoms: meal completion < 50%  Interventions: Ensure Enlive (each supplement provides 350kcal and 20 grams of protein)  Body mass index is 28.9 kg/m.         Subjective: Feels ok, febrile overnight while getting blood.    Examination:  General exam: Appears calm and comfortable  Respiratory system: Clear to auscultation. Respiratory effort normal. Cardiovascular system: S1 & S2 heard, RRR. No JVD, murmurs, rubs, gallops or clicks. No pedal edema. Gastrointestinal system: Abdomen is nondistended, soft and nontender. No organomegaly  or masses felt. Normal bowel sounds heard. Central nervous system: Alert and oriented. No focal neurological deficits. Extremities: Symmetric 5 x 5 power. Skin: No rashes, lesions or ulcers Psychiatry: Judgement and insight  appear normal. Mood & affect appropriate.     Objective: Vitals:   12/18/21 1830 12/18/21 1831 12/18/21 2147 12/19/21 0532  BP: 128/75 128/75 (!) 149/87 127/81  Pulse: 88 88 84 94  Resp: _0 Temp: 98.7 F (37.1 C) 98.7 F (37.1 C) 98.2 F (36.8 C) (!) 100.5 F (38.1 C)  TempSrc:  Oral Oral Oral  SpO2:  100% 100% 100%  Weight:      Height:        Intake/Output Summary (Last 24 hours) at 12/19/2021 0847 Last data filed at 12/18/2021 2147 Gross per 24 hour  Intake 1195 ml  Output 1 ml  Net 1194 ml   Filed Weights   12/15/21 0225 12/16/21 0500 12/17/21 0500  Weight: 83.6 kg 82.9 kg 83.7 kg     Data Reviewed:   CBC: Recent Labs  Lab 12/13/21 0209 12/14/21 0421 12/14/21 1014 12/15/21 0305 12/16/21 0220 12/17/21 0328 12/18/21 0444  WBC 2.6* 2.3*  --  2.8* 2.2* 2.5* 2.2*  NEUTROABS 1.5* 1.2*  --  1.6* 1.2* 1.4*  --   HGB 7.3* 6.8* 7.1* 8.3* 7.2* 7.1* 6.9*  HCT 22.4* 21.9* 21.9* 25.1* 21.7* 22.0* 20.8*  MCV 88.2 90.5  --  87.8 88.6 89.1 87.8  PLT 43* 43*  --  39* 35* 37* 36*   Basic Metabolic Panel: Recent Labs  Lab 12/13/21 0209 12/14/21 0421 12/15/21 0305 12/16/21 0220 12/17/21 0328  NA 135 135 136 134* 134*  K 4.2 3.9 4.0 4.0 4.1  CL 106 105 105 101 103  CO2 20* 19* 22 20* 21*  GLUCOSE 150* 117* 106* 110* 104*  BUN _1 CREATININE 1.16 0.99 0.93 0.99 0.99  CALCIUM 8.0* 8.2* 8.3* 8.2* 8.2*  MG 1.9 1.7 1.8 1.7 1.9   GFR: Estimated Creatinine Clearance: 72.8 mL/min (by C-G formula based on SCr of 0.99 mg/dL). Liver Function Tests: Recent Labs  Lab 12/13/21 0209 12/14/21 0421 12/15/21 0305 12/16/21 0220  AST 30 38 60* 39  ALT 22 26 43 36  ALKPHOS 107 119 119 107  BILITOT 1.2 1.5* 1.8* 1.6*  PROT 5.2* 5.1* 5.5* 5.1*  ALBUMIN 2.0* 2.0* 2.2* 2.0*   No results for input(s): "LIPASE", "AMYLASE" in the last 168 hours. No results for input(s): "AMMONIA" in the last 168 hours. Coagulation Profile: No results for input(s):  "INR", "PROTIME" in the last 168 hours. Cardiac Enzymes: No results for input(s): "CKTOTAL", "CKMB", "CKMBINDEX", "TROPONINI" in the last 168 hours. BNP (last 3 results) No results for input(s): "PROBNP" in the last 8760 hours. HbA1C: No results for input(s): "HGBA1C" in the last 72 hours. CBG: No results for input(s): "GLUCAP" in the last 168 hours. Lipid Profile: No results for input(s): "CHOL", "HDL", "LDLCALC", "TRIG", "CHOLHDL", "LDLDIRECT" in the last 72 hours. Thyroid Function Tests: No results for input(s): "TSH", "T4TOTAL", "FREET4", "T3FREE", "THYROIDAB" in the last 72 hours. Anemia Panel: No results for input(s): "VITAMINB12", "FOLATE", "FERRITIN", "TIBC", "IRON", "RETICCTPCT" in the last 72 hours. Sepsis Labs: Recent Labs  Lab 12/13/21 0209  PROCALCITON 0.24    Recent Results (from the past 240 hour(s))  Resp Panel by RT-PCR (Flu A&B, Covid) Anterior Nasal Swab     Status: None   Collection Time: 12/10/21 12:16 PM   Specimen: Anterior  Nasal Swab  Result Value Ref Range Status   SARS Coronavirus 2 by RT PCR NEGATIVE NEGATIVE Final    Comment: (NOTE) SARS-CoV-2 target nucleic acids are NOT DETECTED.  The SARS-CoV-2 RNA is generally detectable in upper respiratory specimens during the acute phase of infection. The lowest concentration of SARS-CoV-2 viral copies this assay can detect is 138 copies/mL. A negative result does not preclude SARS-Cov-2 infection and should not be used as the sole basis for treatment or other patient management decisions. A negative result may occur with  improper specimen collection/handling, submission of specimen other than nasopharyngeal swab, presence of viral mutation(s) within the areas targeted by this assay, and inadequate number of viral copies(<138 copies/mL). A negative result must be combined with clinical observations, patient history, and epidemiological information. The expected result is Negative.  Fact Sheet for  Patients:  EntrepreneurPulse.com.au  Fact Sheet for Healthcare Providers:  IncredibleEmployment.be  This test is no t yet approved or cleared by the [place] FDA and  has been authorized for detection and/or diagnosis of SARS-CoV-2 by FDA under an Emergency Use Authorization (EUA). This EUA will remain  in effect (meaning this test can be used) for the duration of the COVID-19 declaration under Section 564(b)(1) of the Act, 21 U.S.C.section 360bbb-3(b)(1), unless the authorization is terminated  or revoked sooner.       Influenza A by PCR NEGATIVE NEGATIVE Final   Influenza B by PCR NEGATIVE NEGATIVE Final    Comment: (NOTE) The Xpert Xpress SARS-CoV-2/FLU/RSV plus assay is intended as an aid in the diagnosis of influenza from Nasopharyngeal swab specimens and should not be used as a sole basis for treatment. Nasal washings and aspirates are unacceptable for Xpert Xpress SARS-CoV-2/FLU/RSV testing.  Fact Sheet for Patients: EntrepreneurPulse.com.au  Fact Sheet for Healthcare Providers: IncredibleEmployment.be  This test is not yet approved or cleared by the [place] FDA and has been authorized for detection and/or diagnosis of SARS-CoV-2 by FDA under an Emergency Use Authorization (EUA). This EUA will remain in effect (meaning this test can be used) for the duration of the COVID-19 declaration under Section 564(b)(1) of the Act, 21 U.S.C. section 360bbb-3(b)(1), unless the authorization is terminated or revoked.  Performed at [facility], [place] [address], [place], [place] [address]   Urine Culture     Status: None   Collection Time: 12/11/21  6:55 AM   Specimen: Urine, Clean Catch  Result Value Ref Range Status   Specimen Description URINE, CLEAN CATCH  Final   Special Requests NONE  Final   Culture   Final    NO GROWTH Performed at [facility], [place] [address],  [place], [place] [address]    Report Status 12/12/2021 FINAL  Final  Culture, blood (Routine X 2) w Reflex to ID Panel     Status: None   Collection Time: 12/11/21  6:55 AM   Specimen: BLOOD RIGHT HAND  Result Value Ref Range Status   Specimen Description BLOOD RIGHT HAND  Final   Special Requests   Final    BOTTLES DRAWN AEROBIC AND ANAEROBIC Blood Culture adequate volume   Culture   Final    NO GROWTH 5 DAYS Performed at [facility], [place] [address] [place], [place] [address]    Report Status 12/16/2021 FINAL  Final  Culture, blood (Routine X 2) w Reflex to ID Panel     Status: None   Collection Time: 12/11/21  6:02 PM   Specimen:  BLOOD  Result Value Ref Range Status   Specimen Description BLOOD LEFT ANTECUBITAL  Final   Special Requests   Final    BOTTLES DRAWN AEROBIC AND ANAEROBIC Blood Culture adequate volume   Culture   Final    NO GROWTH 5 DAYS Performed at [facility], [address], [place], [place] [address]    Report Status 12/16/2021 FINAL  Final         Radiology Studies: No results found.      Scheduled Meds:  B-complex with vitamin C  1 tablet Oral Daily   feeding supplement  237 mL Oral BID BM   folic acid  1 mg Oral Daily   lidocaine  1 patch Transdermal Q24H   metoprolol tartrate  12.5 mg Oral BID   multivitamin with minerals  1 tablet Oral Daily   pantoprazole (PROTONIX) IV  40 mg Intravenous Q12H   senna-docusate  1 tablet Oral BID   thiamine  100 mg Oral Daily   Continuous Infusions:   LOS: 9 days   Time spent= 35 mins    Giulian Goldring Arsenio Loader, MD [facility]  If 7PM-7AM, please contact night-coverage  12/19/2021, 8:47 AM

## 2021-12-20 ENCOUNTER — Other Ambulatory Visit (HOSPITAL_COMMUNITY): Payer: Self-pay

## 2021-12-20 DIAGNOSIS — I10 Essential (primary) hypertension: Secondary | ICD-10-CM

## 2021-12-20 DIAGNOSIS — N390 Urinary tract infection, site not specified: Secondary | ICD-10-CM | POA: Diagnosis not present

## 2021-12-20 LAB — CBC
HCT: 25.1 % — ABNORMAL LOW (ref 39.0–52.0)
Hemoglobin: 8.5 g/dL — ABNORMAL LOW (ref 13.0–17.0)
MCH: 30 pg (ref 26.0–34.0)
MCHC: 33.9 g/dL (ref 30.0–36.0)
MCV: 88.7 fL (ref 80.0–100.0)
Platelets: 39 10*3/uL — ABNORMAL LOW (ref 150–400)
RBC: 2.83 MIL/uL — ABNORMAL LOW (ref 4.22–5.81)
RDW: 19.2 % — ABNORMAL HIGH (ref 11.5–15.5)
WBC: 2.4 10*3/uL — ABNORMAL LOW (ref 4.0–10.5)
nRBC: 2.1 % — ABNORMAL HIGH (ref 0.0–0.2)

## 2021-12-20 LAB — BASIC METABOLIC PANEL
Anion gap: 9 (ref 5–15)
BUN: 18 mg/dL (ref 8–23)
CO2: 27 mmol/L (ref 22–32)
Calcium: 8.2 mg/dL — ABNORMAL LOW (ref 8.9–10.3)
Chloride: 101 mmol/L (ref 98–111)
Creatinine, Ser: 1.08 mg/dL (ref 0.61–1.24)
GFR, Estimated: >60 mL/min (ref >60)
Glucose, Bld: 99 mg/dL (ref 70–99)
Potassium: 3.9 mmol/L (ref 3.5–5.1)
Sodium: 137 mmol/L (ref 135–145)

## 2021-12-20 LAB — TRANSFUSION REACTION
DAT C3: NEGATIVE
Post RXN DAT IgG: NEGATIVE

## 2021-12-20 LAB — PREPARE RBC (CROSSMATCH)

## 2021-12-20 LAB — MAGNESIUM: Magnesium: 1.9 mg/dL (ref 1.7–2.4)

## 2021-12-20 MED ORDER — PANTOPRAZOLE SODIUM 40 MG PO TBEC
40.0000 mg | DELAYED_RELEASE_TABLET | Freq: Every day | ORAL | 0 refills | Status: DC
  Filled 2021-12-20: qty 30, 30d supply, fill #0

## 2021-12-20 MED ORDER — THIAMINE HCL 100 MG PO TABS
100.0000 mg | ORAL_TABLET | Freq: Every day | ORAL | 0 refills | Status: DC
  Filled 2021-12-20: qty 30, 30d supply, fill #0

## 2021-12-20 MED ORDER — ADULT MULTIVITAMIN W/MINERALS CH: 1.0000 | ORAL_TABLET | Freq: Every day | ORAL | Status: DC

## 2021-12-20 MED ORDER — POLYETHYLENE GLYCOL 3350 17 GM/SCOOP PO POWD
17.0000 g | Freq: Every day | ORAL | 0 refills | Status: DC | PRN
  Filled 2021-12-20: qty 238, 14d supply, fill #0

## 2021-12-20 MED ORDER — SODIUM CHLORIDE 0.9% IV SOLUTION: Freq: Once | INTRAVENOUS | Status: AC

## 2021-12-20 MED ORDER — METOPROLOL TARTRATE 25 MG PO TABS
12.5000 mg | ORAL_TABLET | Freq: Two times a day (BID) | ORAL | 0 refills | Status: DC
  Filled 2021-12-20 – 2022-01-13 (×2): qty 30, 30d supply, fill #0

## 2021-12-20 MED ORDER — FOLIC ACID 1 MG PO TABS
1.0000 mg | ORAL_TABLET | Freq: Every day | ORAL | 0 refills | Status: DC
  Filled 2021-12-20: qty 30, 30d supply, fill #0

## 2021-12-20 MED ORDER — VITAMIN B COMPLEX PO TABS
1.0000 | ORAL_TABLET | Freq: Every day | ORAL | 0 refills | Status: DC
  Filled 2021-12-20: qty 30, 30d supply, fill #0

## 2021-12-20 MED ORDER — SENNOSIDES-DOCUSATE SODIUM 8.6-50 MG PO TABS
2.0000 | ORAL_TABLET | Freq: Every evening | ORAL | 0 refills | Status: DC | PRN
  Filled 2021-12-20: qty 60, 30d supply, fill #0

## 2021-12-20 NOTE — Discharge Summary (Signed)
Physician Discharge Summary  Tyler [name] BBC:[id number] DOB: August 19, 1950 DOA: 12/10/2021  PCP: [name], FNP  Admit date: 12/10/2021 Discharge date: 12/20/2021  Admitted From: Home Disposition:  Home  Recommendations for Outpatient Follow-up:  Follow up with PCP in 1-2 weeks Please obtain BMP/CBC in 3 to 5 days Outpatient hematology appointment to be arranged by hematology service.  Planning early next week Supplements and bowel regimen ordered for home : Discharge Condition: Stable CODE STATUS: Full code Diet recommendation: Regular  Brief/Interim Summary: 71 y.o. male with past medical history of prostate cancer status post prostatectomy in 2021, history of bilateral pulmonary embolism after his surgery in 2021, essential hypertension presented with worsening back pain radiating down to right leg along with fevers and hematuria.  On presentation, his hemoglobin was 6.3. He was started on broad-spectrum antibiotics for possible UTI.  Subsequently, oncology was consulted for pancytopenia and neurosurgery was consulted for lumbosacral stenosis and severe lower back pain.  Patient is not a surgical candidate per neurosurgery.  He underwent bone marrow biopsy by IR on 12/14/2021.  Eventually biopsy showed concerns for myelodysplastic syndrome.  Case was discussed by oncology/hematology with the family.  Recommending 1 unit PRBC transfusion and discharging.  They will set up for outpatient follow-up.     Assessment & Plan:  Principal Problem:   UTI (urinary tract infection) Active Problems:   Prostate cancer ([place])   Hypertension   Sepsis ([place])   Normocytic anemia   Thrombocytopenia (HCC)   Hyponatremia   Sepsis secondary to UTI (HCC)   Symptomatic anemia     Urosepsis -Urine cultures and blood cultures remain negative.  Patient completed total 7 days of cefepime.  Now remains afebrile.  CT abdomen pelvis, MRI lumbar spine did not show any acute findings.  2D echocardiogram  showed EF 75%, CTA chest is negative for PE.   Pancytopenia /Symptomatic anemia: Myelodysplastic syndrome Undergone blood transfusion during recent admission and during this admission.  Normal folate and B12 levels.  Continue vitamin supplement including B complex.  Biopsy showing concerns of myelodysplastic syndrome.  Oncology to arrange for outpatient follow-up early next week with repeat blood work and possible or transfusion.  Recommending 1 unit of PRBC transfusion prior to discharge today     Abdominal pain: Continue PPI, LFTs, lipase unremarkable.  CT abdomen pelvis negative   Severe lower back pain radiating to her right lower extremity Lumbosacral stenosis: -Chronic changes on the MRI.  Seen by neurosurgery, not a candidate for surgical intervention at this time.  Pain management.  PT/OT   History of bilateral PE in 2021 Previously thought to be provoked PE secondary to prostatectomy in 2021.  Treated with Eliquis   ETOH Abuse: -Heavy consumption in the last 2 to 3 months per patient's daughter.   No evidence of withdrawal currently.  Continue CIWA protocol.   Continue thiamine, multivitamins and folic acid.   History of prostate cancer status post prostatectomy in 2021 -Followed by Dr. [name] with urology.  PSA was undetectable earlier this month.   Results available in care everywhere.   Obesity -Outpatient follow-up Estimated body mass index is 28.9 kg/m as calculated from the following:   Height as of this encounter: _0  (1.702 m).   Weight as of this encounter: 83.7 kg.           Discharge Diagnoses:  Principal Problem:   UTI (urinary tract infection) Active Problems:   Prostate cancer ([place])   Hypertension   Sepsis ([place])  Normocytic anemia   Thrombocytopenia (HCC)   Hyponatremia   Sepsis secondary to UTI (HCC)   Symptomatic anemia      Consultations: Heme otology  Subjective: Feels well no complaints  Discharge Exam: Vitals:   12/19/21  2100 12/20/21 0836  BP: 118/84 126/80  Pulse: 89 (!) 108  Resp:  18  Temp: 99 F (37.2 C) 99.8 F (37.7 C)  SpO2: 100% 100%   Vitals:   12/19/21 0532 12/19/21 0849 12/19/21 2100 12/20/21 0836  BP: 127/81 122/81 118/84 126/80  Pulse: 94 86 89 (!) 108  Resp: 20   18  Temp: (!) 100.5 F (38.1 C) 97.6 F (36.4 C) 99 F (37.2 C) 99.8 F (37.7 C)  TempSrc: Oral Oral Oral Oral  SpO2: 100% 100% 100% 100%  Weight:      Height:        General: Pt is alert, awake, not in acute distress Cardiovascular: RRR, S1/S2 +, no rubs, no gallops Respiratory: CTA bilaterally, no wheezing, no rhonchi Abdominal: Soft, NT, ND, bowel sounds + Extremities: no edema, no cyanosis  Discharge Instructions  Discharge Instructions     Ambulatory referral to Physical Therapy   Complete by: As directed       Allergies as of 12/20/2021       Reactions   Amlodipine Swelling   BILATERAL FEET TO ANKLES        Medication List     TAKE these medications    B-complex with vitamin C tablet Take 1 tablet by mouth daily. Start taking on: December 21, 2021   baclofen 10 MG tablet Commonly known as: LIORESAL Take 10 mg by mouth 3 (three) times daily.   folic acid 1 MG tablet Commonly known as: FOLVITE Take 1 tablet (1 mg total) by mouth daily. Start taking on: December 21, 2021   ibuprofen 200 MG tablet Commonly known as: ADVIL Take 800 mg by mouth every 6 (six) hours as needed for mild pain.   lisinopril 5 MG tablet Commonly known as: ZESTRIL Take 5 mg by mouth daily.   metoprolol tartrate 25 MG tablet Commonly known as: LOPRESSOR Take 0.5 tablets (12.5 mg total) by mouth 2 (two) times daily.   multivitamin with minerals Tabs tablet Take 1 tablet by mouth daily. Start taking on: December 21, 2021   Muscle Rub 10-15 % Crea Apply 1 Application topically daily as needed for muscle pain.   ondansetron 4 MG disintegrating tablet Commonly known as: ZOFRAN-ODT 37m ODT q4 hours prn  nausea/vomit   pantoprazole 40 MG tablet Commonly known as: PROTONIX Take 1 tablet (40 mg total) by mouth daily before breakfast.   polyethylene glycol 17 g packet Commonly known as: MIRALAX / GLYCOLAX Take 17 g by mouth daily as needed for moderate constipation.   senna-docusate 8.6-50 MG tablet Commonly known as: Senokot-S Take 2 tablets by mouth at bedtime as needed for mild constipation.   thiamine 100 MG tablet Commonly known as: Vitamin B-1 Take 1 tablet (100 mg total) by mouth daily. Start taking on: December 21, 2021        Allergies  Allergen Reactions   Amlodipine Swelling    BILATERAL FEET TO ANKLES    You were cared for by a hospitalist during your hospital stay. If you have any questions about your discharge medications or the care you received while you were in the hospital after you are discharged, you can call the unit and asked to speak with the hospitalist on call if  the hospitalist that took care of you is not available. Once you are discharged, your primary care physician will handle any further medical issues. Please note that no refills for any discharge medications will be authorized once you are discharged, as it is imperative that you return to your primary care physician (or establish a relationship with a primary care physician if you do not have one) for your aftercare needs so that they can reassess your need for medications and monitor your lab values.   Procedures/Studies: DG Chest Port 1 View  Result Date: 12/19/2021 CLINICAL DATA:  Shortness of breath EXAM: PORTABLE CHEST 1 VIEW COMPARISON:  12/10/2021 FINDINGS: Heart size upper limits of normal. Aortic tortuosity. Poor inspiration. Allowing for that the lungs are clear. The vascularity is normal. No effusions. IMPRESSION: Poor inspiration. No active disease suspected. Aortic tortuosity. Electronically Signed   By: Nelson Chimes M.D.   On: 12/19/2021 09:34   ECHOCARDIOGRAM COMPLETE  Result Date:  12/16/2021    ECHOCARDIOGRAM REPORT   Patient Name:   Tyler [name] Date of Exam: 12/16/2021 Medical Rec #:  [id number]    Height:       67.0 in Accession #:    [id number]   Weight:       182.8 lb Date of Birth:  09-29-1950    BSA:          1.946 m Patient Age:    48 years     BP:           123/71 mmHg Patient Gender: M            HR:           99 bpm. Exam Location:  Inpatient Procedure: 2D Echo, Cardiac Doppler, Color Doppler and Intracardiac            Opacification Agent Indications:    Fever R50.9  History:        Patient has prior history of Echocardiogram examinations, most                 recent 09/06/2019. Sepsis; Risk Factors:Hypertension and                 Non-Smoker.  Sonographer:    Greer Pickerel Referring Phys: [id number] [facility]  Sonographer Comments: Technically difficult study due to poor echo windows and suboptimal apical window. Image acquisition challenging due to patient body habitus and Image acquisition challenging due to respiratory motion. IMPRESSIONS  1. Left ventricular ejection fraction, by estimation, is >75%. The left ventricle has hyperdynamic function. The left ventricle has no regional wall motion abnormalities. There is mild left ventricular hypertrophy. Left ventricular diastolic parameters are consistent with Grade I diastolic dysfunction (impaired relaxation).  2. Right ventricular systolic function is normal. The right ventricular size is normal. Tricuspid regurgitation signal is inadequate for assessing PA pressure.  3. The mitral valve is normal in structure. No evidence of mitral valve regurgitation. No evidence of mitral stenosis.  4. The aortic valve has an indeterminant number of cusps. Aortic valve regurgitation is not visualized. No aortic stenosis is present.  5. The inferior vena cava is normal in size with greater than 50% respiratory variability, suggesting right atrial pressure of 3 mmHg. FINDINGS  Left Ventricle: Left ventricular ejection fraction, by estimation,  is >75%. The left ventricle has hyperdynamic function. The left ventricle has no regional wall motion abnormalities. The left ventricular internal cavity size was normal in size. There is mild left ventricular hypertrophy. Left ventricular diastolic  parameters are consistent with Grade I diastolic dysfunction (impaired relaxation). Normal left ventricular filling pressure. Right Ventricle: The right ventricular size is normal. Right vetricular wall thickness was not well visualized. Right ventricular systolic function is normal. Tricuspid regurgitation signal is inadequate for assessing PA pressure. Left Atrium: Left atrial size was normal in size. Right Atrium: Right atrial size was not well visualized. Pericardium: There is no evidence of pericardial effusion. Mitral Valve: The mitral valve is normal in structure. No evidence of mitral valve regurgitation. No evidence of mitral valve stenosis. Tricuspid Valve: The tricuspid valve is normal in structure. Tricuspid valve regurgitation is not demonstrated. No evidence of tricuspid stenosis. Aortic Valve: The aortic valve has an indeterminant number of cusps. Aortic valve regurgitation is not visualized. No aortic stenosis is present. Aortic valve mean gradient measures 4.0 mmHg. Aortic valve peak gradient measures 10.0 mmHg. Aortic valve area, by VTI measures 2.52 cm. Pulmonic Valve: The pulmonic valve was not well visualized. Pulmonic valve regurgitation is not visualized. No evidence of pulmonic stenosis. Aorta: The aortic root and ascending aorta are structurally normal, with no evidence of dilitation. Venous: The inferior vena cava is normal in size with greater than 50% respiratory variability, suggesting right atrial pressure of 3 mmHg. IAS/Shunts: No atrial level shunt detected by color flow Doppler.  LEFT VENTRICLE PLAX 2D LVIDd:         3.10 cm   Diastology LVIDs:         1.80 cm   LV e' medial:    10.40 cm/s LV PW:         1.20 cm   LV E/e' medial:  4.5 LV  IVS:        1.00 cm   LV e' lateral:   10.90 cm/s LVOT diam:     2.10 cm   LV E/e' lateral: 4.2 LV SV:         61 LV SV Index:   31 LVOT Area:     3.46 cm  RIGHT VENTRICLE RV S prime:     23.60 cm/s TAPSE (M-mode): 1.8 cm LEFT ATRIUM             Index        RIGHT ATRIUM           Index LA diam:        2.60 cm 1.34 cm/m   RA Area:     11.70 cm LA Vol (A2C):   66.9 ml 34.37 ml/m  RA Volume:   24.20 ml  12.43 ml/m LA Vol (A4C):   36.8 ml 18.91 ml/m LA Biplane Vol: 52.3 ml 26.87 ml/m  AORTIC VALVE AV Area (Vmax):    2.43 cm AV Area (Vmean):   2.92 cm AV Area (VTI):     2.52 cm AV Vmax:           158.36 cm/s AV Vmean:          88.501 cm/s AV VTI:            0.243 m AV Peak Grad:      10.0 mmHg AV Mean Grad:      4.0 mmHg LVOT Vmax:         111.00 cm/s LVOT Vmean:        74.600 cm/s LVOT VTI:          0.177 m LVOT/AV VTI ratio: 0.73  AORTA Ao Root diam: 3.30 cm Ao Asc diam:  3.40 cm MITRAL VALVE MV Area (PHT): 4.06  cm    SHUNTS MV Decel Time: 187 msec    Systemic VTI:  0.18 m MV E velocity: 46.30 cm/s  Systemic Diam: 2.10 cm MV A velocity: 75.80 cm/s MV E/A ratio:  0.61 Carlyle Dolly MD Electronically signed by Carlyle Dolly MD Signature Date/Time: 12/16/2021/12:16:02 PM    Final    CT Angio Chest Pulmonary Embolism (PE) W or WO Contrast  Result Date: 12/15/2021 CLINICAL DATA:  Pulmonary embolism (PE) suspected, high prob EXAM: CT ANGIOGRAPHY CHEST WITH CONTRAST TECHNIQUE: Multidetector CT imaging of the chest was performed using the standard protocol during bolus administration of intravenous contrast. Multiplanar CT image reconstructions and MIPs were obtained to evaluate the vascular anatomy. RADIATION DOSE REDUCTION: This exam was performed according to the departmental dose-optimization program which includes automated exposure control, adjustment of the mA and/or kV according to patient size and/or use of iterative reconstruction technique. CONTRAST:  37m OMNIPAQUE IOHEXOL 350 MG/ML SOLN  COMPARISON:  X-ray 12/10/2021.  CT 09/05/2019 FINDINGS: Cardiovascular: Satisfactory opacification of the pulmonary arteries to the segmental level. No evidence of pulmonary embolism. Thoracic aorta is nonaneurysmal. Normal heart size. No pericardial effusion. Mediastinum/Nodes: No enlarged mediastinal, hilar, or axillary lymph nodes. Thyroid gland, trachea, and esophagus demonstrate no significant findings. Lungs/Pleura: No airspace consolidation. No pleural effusion or pneumothorax. Upper Abdomen: No acute abnormality. Musculoskeletal: No chest wall abnormality. No acute or significant osseous findings. Review of the MIP images confirms the above findings. IMPRESSION: No evidence of pulmonary embolism or other acute intrathoracic findings. Electronically Signed   By: NDavina PokeD.O.   On: 12/15/2021 16:21   CT BONE MARROW BIOPSY & ASPIRATION  Result Date: 12/14/2021 INDICATION: Pancytopenia EXAM: CT BONE MARROW BIOPSY AND ASPIRATION MEDICATIONS: None. ANESTHESIA/SEDATION: Moderate (conscious) sedation was employed during this procedure. A total of Versed 1.5 mg and Fentanyl 75 mcg was administered intravenously. Moderate Sedation Time: 11 minutes. The patient's level of consciousness and vital signs were monitored continuously by radiology nursing throughout the procedure under my direct supervision. FLUOROSCOPY TIME:  N/a COMPLICATIONS: None immediate. PROCEDURE: Informed written consent was obtained from the patient after a thorough discussion of the procedural risks, benefits and alternatives. All questions were addressed. Maximal Sterile Barrier Technique was utilized including caps, mask, sterile gowns, sterile gloves, sterile drape, hand hygiene and skin antiseptic. A timeout was performed prior to the initiation of the procedure. The patient was placed prone on the CT exam table. Limited CT of the pelvis was performed for planning purposes. Skin entry site was marked, and the overlying skin was  prepped and draped in the standard sterile fashion. Local analgesia was obtained with 1% lidocaine. Using CT guidance, an 11 gauge needle was advanced just deep to the cortex of the right posterior ilium. Subsequently, bone marrow aspiration and core biopsy were performed. Specimens were submitted to lab/pathology for handling. Hemostasis was achieved with manual pressure, and a clean dressing was placed. The patient tolerated the procedure well without immediate complication. IMPRESSION: Successful CT-guided bone marrow aspiration and core biopsy of the right posterior ilium. Electronically Signed   By: YAlbin FellingM.D.   On: 12/14/2021 10:24   MR LUMBAR SPINE WO CONTRAST  Result Date: 12/11/2021 CLINICAL DATA:  Low back pain EXAM: MRI LUMBAR SPINE WITHOUT CONTRAST TECHNIQUE: Multiplanar, multisequence MR imaging of the lumbar spine was performed. No intravenous contrast was administered. COMPARISON:  No prior MRI of the lumbar spine, correlation is made with 11/18/2021 lumbar spine radiographs and 12/10/2021 CT abdomen pelvis FINDINGS: Segmentation:  5 lumbar-type vertebral bodies. Alignment:  Mild levocurvature.  No listhesis. Vertebrae: No acute fracture or suspicious osseous lesion. Diffusely decreased marrow signal, which on T1 is slightly hyperintense to skeletal muscle, likely red marrow reconversion. Speckled marrow signal in the bilateral iliac bones and, to a lesser extent, vertebral bodies, which is favored to be benign given its appearance on the 12/10/2021 CT. Conus medullaris and cauda equina: Conus extends to the T12-L1 level. Conus and cauda equina appear normal. Paraspinal and other soft tissues: Right renal cyst, better evaluated on the prior CT, for which no follow-up is currently indicated. Disc levels: T12-L1: Disc desiccation without significant disc bulge. No spinal canal stenosis or neural foraminal narrowing. L1-L2: No significant disc bulge. No spinal canal stenosis or neural  foraminal narrowing. L2-L3: Disc desiccation and minimal disc bulge with central annular fissure. Mild facet arthropathy. No spinal canal stenosis or neural foraminal narrowing. L3-L4: Disc desiccation and minimal disc bulge with left foraminal protrusion. Mild facet arthropathy. No spinal canal stenosis or neural foraminal narrowing. L4-L5: Disc desiccation and minimal disc bulge. Mild facet arthropathy. Epidural lipomatosis causes mild thecal sac narrowing at the disc level and moderate to severe thecal sac narrowing posterior to L5. No neural foraminal narrowing. L5-S1: No significant disc bulge. Mild facet arthropathy. Epidural lipomatosis, which causes severe thecal sac narrowing. No neural foraminal narrowing. IMPRESSION: 1. 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 No neural foraminal narrowing. 2. Diffusely decreased marrow signal, slightly hyperintense to skeletal muscle, likely red marrow reconversion. Additional speckled marrow signal in the bilateral iliac bones and, to a lesser extent, vertebral bodies, is favored to be benign given its appearance on the 12/10/2021 CT. A diffuse infiltrative bone marrow process is felt to be less likely. Consider correlation with serum labs. Electronically Signed   By: Merilyn Baba M.D.   On: 12/11/2021 13:07   CT ABDOMEN PELVIS W CONTRAST  Result Date: 12/10/2021 CLINICAL DATA:  LEFT lower quadrant abdominal pain.  Sepsis EXAM: CT ABDOMEN AND PELVIS WITH CONTRAST TECHNIQUE: Multidetector CT imaging of the abdomen and pelvis was performed using the standard protocol following bolus administration of intravenous contrast. RADIATION DOSE REDUCTION: This exam was performed according to the departmental dose-optimization program which includes automated exposure control, adjustment of the mA  and/or kV according to patient size and/or use of iterative reconstruction technique. CONTRAST:  59m OMNIPAQUE IOHEXOL 350 MG/ML SOLN COMPARISON:  None Available. FINDINGS: Lower chest: Lung bases are clear. Hepatobiliary: No focal hepatic lesion. No biliary duct dilatation. Common bile duct is normal. Pancreas: Pancreas is normal. No ductal dilatation. No pancreatic inflammation. Spleen: Normal spleen Adrenals/urinary tract: Adrenal glands normal. Bilateral simple fluid attenuation renal cysts. No follow-up recommended for Bosniak 1 renal cysts. Ureters and bladder normal. Stomach/Bowel: Stomach, small bowel, appendix, and cecum are normal. The colon and rectosigmoid colon are normal. Vascular/Lymphatic: Abdominal aorta is normal caliber. No periportal or retroperitoneal adenopathy. No pelvic adenopathy. Reproductive: Post prostatectomy Other: Prior LEFT inguinal hernia repair.  No evidence recurrence. Musculoskeletal: No aggressive osseous lesion. IMPRESSION: 1. No acute findings abdomen pelvis. 2. Prior LEFT inguinal hernia repair without complication. 3. Bilateral benign renal cysts.  No obstructive uropathy Electronically Signed   By: SSuzy BouchardM.D.   On: 12/10/2021 16:15   DG Chest 2 View  Result Date: 12/10/2021 CLINICAL DATA:  Chest  pain. EXAM: CHEST - 2 VIEW COMPARISON:  Chest x-ray 09/07/2019. FINDINGS: The heart size and mediastinal contours are within normal limits. Both lungs are clear. No visible pleural effusions or pneumothorax. No acute osseous abnormality. IMPRESSION: No active cardiopulmonary disease. Electronically Signed   By: Margaretha Sheffield M.D.   On: 12/10/2021 12:44   CT ABDOMEN PELVIS W CONTRAST  Result Date: 11/29/2021 CLINICAL DATA:  Abdominal pain and emesis EXAM: CT ABDOMEN AND PELVIS WITH CONTRAST TECHNIQUE: Multidetector CT imaging of the abdomen and pelvis was performed using the standard protocol following bolus administration of intravenous contrast. RADIATION  DOSE REDUCTION: This exam was performed according to the departmental dose-optimization program which includes automated exposure control, adjustment of the mA and/or kV according to patient size and/or use of iterative reconstruction technique. CONTRAST:  71m OMNIPAQUE IOHEXOL 350 MG/ML SOLN COMPARISON:  CT abdomen and pelvis dated July 13th 2021 FINDINGS: Lower chest: No acute abnormality. Hepatobiliary: No focal liver abnormality is seen. No gallstones, gallbladder wall thickening, or biliary dilatation. Pancreas: Unremarkable. No pancreatic ductal dilatation or surrounding inflammatory changes. Spleen: Normal in size without focal abnormality. Adrenals/Urinary Tract: Bilateral adrenal glands are unremarkable. No hydronephrosis or nephrolithiasis. Bilateral low-attenuation renal lesions largest are compatible with simple cysts, others are too small to completely characterize, no further follow-up imaging is recommended. Bladder is unremarkable. Stomach/Bowel: Stomach is within normal limits. Appendix appears normal. No evidence of bowel wall thickening, distention, or inflammatory changes. Vascular/Lymphatic: No significant vascular findings are present. No enlarged abdominal or pelvic lymph nodes. Reproductive: Prostate is surgically absent. Ovoid structures previously described along the pelvic sidewalls are no longer present, likely resolved hematomas. Other: No abdominal wall hernia or abnormality. No abdominopelvic ascites. Musculoskeletal: No acute or significant osseous findings. IMPRESSION: No acute findings in the abdomen or pelvis. Electronically Signed   By: LYetta GlassmanM.D.   On: 11/29/2021 08:52     The results of significant diagnostics from this hospitalization (including imaging, microbiology, ancillary and laboratory) are listed below for reference.     Microbiology: Recent Results (from the past 240 hour(s))  Resp Panel by RT-PCR (Flu A&B, Covid) Anterior Nasal Swab     Status:  None   Collection Time: 12/10/21 12:16 PM   Specimen: Anterior Nasal Swab  Result Value Ref Range Status   SARS Coronavirus 2 by RT PCR NEGATIVE NEGATIVE Final    Comment: (NOTE) SARS-CoV-2 target nucleic acids are NOT DETECTED.  The SARS-CoV-2 RNA is generally detectable in upper respiratory specimens during the acute phase of infection. The lowest concentration of SARS-CoV-2 viral copies this assay can detect is 138 copies/mL. A negative result does not preclude SARS-Cov-2 infection and should not be used as the sole basis for treatment or other patient management decisions. A negative result may occur with  improper specimen collection/handling, submission of specimen other than nasopharyngeal swab, presence of viral mutation(s) within the areas targeted by this assay, and inadequate number of viral copies(<138 copies/mL). A negative result must be combined with clinical observations, patient history, and epidemiological information. The expected result is Negative.  Fact Sheet for Patients:  hEntrepreneurPulse.com.au Fact Sheet for Healthcare Providers:  hIncredibleEmployment.be This test is no t yet approved or cleared by the [place] and  has been authorized for detection and/or diagnosis of SARS-CoV-2 by FDA under an Emergency Use Authorization (EUA). This EUA will remain  in effect (meaning this test can be used) for the duration of the COVID-19 declaration under Section 564(b)(1) of the  Act, 21 U.S.C.section 360bbb-3(b)(1), unless the authorization is terminated  or revoked sooner.       Influenza A by PCR NEGATIVE NEGATIVE Final   Influenza B by PCR NEGATIVE NEGATIVE Final    Comment: (NOTE) The Xpert Xpress SARS-CoV-2/FLU/RSV plus assay is intended as an aid in the diagnosis of influenza from Nasopharyngeal swab specimens and should not be used as a sole basis for treatment. Nasal washings and aspirates are unacceptable  for Xpert Xpress SARS-CoV-2/FLU/RSV testing.  Fact Sheet for Patients: EntrepreneurPulse.com.au  Fact Sheet for Healthcare Providers: IncredibleEmployment.be  This test is not yet approved or cleared by the [place] FDA and has been authorized for detection and/or diagnosis of SARS-CoV-2 by FDA under an Emergency Use Authorization (EUA). This EUA will remain in effect (meaning this test can be used) for the duration of the COVID-19 declaration under Section 564(b)(1) of the Act, 21 U.S.C. section 360bbb-3(b)(1), unless the authorization is terminated or revoked.  Performed at [facility], [place] [address], [place], [place] [address]   Urine Culture     Status: None   Collection Time: 12/11/21  6:55 AM   Specimen: Urine, Clean Catch  Result Value Ref Range Status   Specimen Description URINE, CLEAN CATCH  Final   Special Requests NONE  Final   Culture   Final    NO GROWTH Performed at [facility], [place] [address] [place], [place] [address]    Report Status 12/12/2021 FINAL  Final  Culture, blood (Routine X 2) w Reflex to ID Panel     Status: None   Collection Time: 12/11/21  6:55 AM   Specimen: BLOOD RIGHT HAND  Result Value Ref Range Status   Specimen Description BLOOD RIGHT HAND  Final   Special Requests   Final    BOTTLES DRAWN AEROBIC AND ANAEROBIC Blood Culture adequate volume   Culture   Final    NO GROWTH 5 DAYS Performed at [facility], [place] [address], [place], [place] [address]    Report Status 12/16/2021 FINAL  Final  Culture, blood (Routine X 2) w Reflex to ID Panel     Status: None   Collection Time: 12/11/21  6:02 PM   Specimen: BLOOD  Result Value Ref Range Status   Specimen Description BLOOD LEFT ANTECUBITAL  Final   Special Requests   Final    BOTTLES DRAWN AEROBIC AND ANAEROBIC Blood Culture adequate volume   Culture   Final    NO GROWTH 5 DAYS Performed at [facility], [place]  [address], [place],  [address]    Report Status 12/16/2021 FINAL  Final  Culture, blood (Routine X 2) w Reflex to ID Panel     Status: None (Preliminary result)   Collection Time: 12/19/21  9:41 AM   Specimen: BLOOD  Result Value Ref Range Status   Specimen Description BLOOD SITE NOT SPECIFIED  Final   Special Requests   Final    BOTTLES DRAWN AEROBIC AND ANAEROBIC Blood Culture adequate volume   Culture   Final    NO GROWTH < 24 HOURS Performed at [facility], [place] [address] [place],  [address]    Report Status PENDING  Incomplete  Culture, blood (Routine X 2) w Reflex to ID Panel     Status: None (Preliminary result)   Collection Time: 12/19/21  9:41 AM   Specimen: BLOOD  Result Value Ref Range Status   Specimen Description BLOOD BLOOD  LEFT HAND  Final   Special Requests   Final    BOTTLES DRAWN AEROBIC AND ANAEROBIC Blood Culture adequate volume   Culture   Final    NO GROWTH < 24 HOURS Performed at [facility], [address], [place], [place] [address]    Report Status PENDING  Incomplete     Labs: BNP (last 3 results) No results for input(s): "BNP" in the last 8760 hours. Basic Metabolic Panel: Recent Labs  Lab 12/14/21 0421 12/15/21 0305 12/16/21 0220 12/17/21 0328 12/20/21 0456  NA 135 136 134* 134* 137  K 3.9 4.0 4.0 4.1 3.9  CL 105 105 101 103 101  CO2 19* 22 20* 21* 27  GLUCOSE 117* 106* 110* 104* 99  BUN _0 CREATININE 0.99 0.93 0.99 0.99 1.08  CALCIUM 8.2* 8.3* 8.2* 8.2* 8.2*  MG 1.7 1.8 1.7 1.9 1.9   Liver Function Tests: Recent Labs  Lab 12/14/21 0421 12/15/21 0305 12/16/21 0220  AST 38 60* 39  ALT 26 43 36  ALKPHOS 119 119 107  BILITOT 1.5* 1.8* 1.6*  PROT 5.1* 5.5* 5.1*  ALBUMIN 2.0* 2.2* 2.0*   No results for input(s): "LIPASE", "AMYLASE" in the last 168 hours. No results for input(s): "AMMONIA" in the last 168 hours. CBC: Recent Labs  Lab 12/14/21 0421 12/14/21 1014 12/15/21 0305 12/16/21 0220  12/17/21 0328 12/18/21 0444 12/19/21 0750 12/20/21 0456  WBC 2.3*  --  2.8* 2.2* 2.5* 2.2* 2.5* 2.4*  NEUTROABS 1.2*  --  1.6* 1.2* 1.4*  --   --   --   HGB 6.8*   < > 8.3* 7.2* 7.1* 6.9* 9.3* 8.5*  HCT 21.9*   < > 25.1* 21.7* 22.0* 20.8* 28.6* 25.1*  MCV 90.5  --  87.8 88.6 89.1 87.8 88.5 88.7  PLT 43*  --  39* 35* 37* 36* 41* 39*   < > = values in this interval not displayed.   Cardiac Enzymes: No results for input(s): "CKTOTAL", "CKMB", "CKMBINDEX", "TROPONINI" in the last 168 hours. BNP: Invalid input(s): "POCBNP" CBG: No results for input(s): "GLUCAP" in the last 168 hours. D-Dimer No results for input(s): "DDIMER" in the last 72 hours. Hgb A1c No results for input(s): "HGBA1C" in the last 72 hours. Lipid Profile No results for input(s): "CHOL", "HDL", "LDLCALC", "TRIG", "CHOLHDL", "LDLDIRECT" in the last 72 hours. Thyroid function studies No results for input(s): "TSH", "T4TOTAL", "T3FREE", "THYROIDAB" in the last 72 hours.  Invalid input(s): "FREET3" Anemia work up No results for input(s): "VITAMINB12", "FOLATE", "FERRITIN", "TIBC", "IRON", "RETICCTPCT" in the last 72 hours. Urinalysis    Component Value Date/Time   COLORURINE AMBER (A) 12/18/2021 1600   APPEARANCEUR HAZY (A) 12/18/2021 1600   LABSPEC 1.020 12/18/2021 1600   PHURINE 5.0 12/18/2021 1600   GLUCOSEU NEGATIVE 12/18/2021 1600   HGBUR NEGATIVE 12/18/2021 1600   BILIRUBINUR NEGATIVE 12/18/2021 1600   KETONESUR NEGATIVE 12/18/2021 1600   PROTEINUR 30 (A) 12/18/2021 1600   NITRITE NEGATIVE 12/18/2021 1600   LEUKOCYTESUR NEGATIVE 12/18/2021 1600   Sepsis Labs Recent Labs  Lab 12/17/21 0328 12/18/21 0444 12/19/21 0750 12/20/21 0456  WBC 2.5* 2.2* 2.5* 2.4*   Microbiology Recent Results (from the past 240 hour(s))  Resp Panel by RT-PCR (Flu A&B, Covid) Anterior Nasal Swab     Status: None   Collection Time: 12/10/21 12:16 PM   Specimen: Anterior Nasal Swab  Result Value Ref Range Status   SARS  Coronavirus 2 by RT  PCR NEGATIVE NEGATIVE Final    Comment: (NOTE) SARS-CoV-2 target nucleic acids are NOT DETECTED.  The SARS-CoV-2 RNA is generally detectable in upper respiratory specimens during the acute phase of infection. The lowest concentration of SARS-CoV-2 viral copies this assay can detect is 138 copies/mL. A negative result does not preclude SARS-Cov-2 infection and should not be used as the sole basis for treatment or other patient management decisions. A negative result may occur with  improper specimen collection/handling, submission of specimen other than nasopharyngeal swab, presence of viral mutation(s) within the areas targeted by this assay, and inadequate number of viral copies(<138 copies/mL). A negative result must be combined with clinical observations, patient history, and epidemiological information. The expected result is Negative.  Fact Sheet for Patients:  EntrepreneurPulse.com.au  Fact Sheet for Healthcare Providers:  IncredibleEmployment.be  This test is no t yet approved or cleared by the [place] FDA and  has been authorized for detection and/or diagnosis of SARS-CoV-2 by FDA under an Emergency Use Authorization (EUA). This EUA will remain  in effect (meaning this test can be used) for the duration of the COVID-19 declaration under Section 564(b)(1) of the Act, 21 U.S.C.section 360bbb-3(b)(1), unless the authorization is terminated  or revoked sooner.       Influenza A by PCR NEGATIVE NEGATIVE Final   Influenza B by PCR NEGATIVE NEGATIVE Final    Comment: (NOTE) The Xpert Xpress SARS-CoV-2/FLU/RSV plus assay is intended as an aid in the diagnosis of influenza from Nasopharyngeal swab specimens and should not be used as a sole basis for treatment. Nasal washings and aspirates are unacceptable for Xpert Xpress SARS-CoV-2/FLU/RSV testing.  Fact Sheet for  Patients: EntrepreneurPulse.com.au  Fact Sheet for Healthcare Providers: IncredibleEmployment.be  This test is not yet approved or cleared by the [place] FDA and has been authorized for detection and/or diagnosis of SARS-CoV-2 by FDA under an Emergency Use Authorization (EUA). This EUA will remain in effect (meaning this test can be used) for the duration of the COVID-19 declaration under Section 564(b)(1) of the Act, 21 U.S.C. section 360bbb-3(b)(1), unless the authorization is terminated or revoked.  Performed at [facility], [place] [address] [place], [place] [address]   Urine Culture     Status: None   Collection Time: 12/11/21  6:55 AM   Specimen: Urine, Clean Catch  Result Value Ref Range Status   Specimen Description URINE, CLEAN CATCH  Final   Special Requests NONE  Final   Culture   Final    NO GROWTH Performed at [facility], [place] [address] [place], [place] [address]    Report Status 12/12/2021 FINAL  Final  Culture, blood (Routine X 2) w Reflex to ID Panel     Status: None   Collection Time: 12/11/21  6:55 AM   Specimen: BLOOD RIGHT HAND  Result Value Ref Range Status   Specimen Description BLOOD RIGHT HAND  Final   Special Requests   Final    BOTTLES DRAWN AEROBIC AND ANAEROBIC Blood Culture adequate volume   Culture   Final    NO GROWTH 5 DAYS Performed at [facility], [place] [address] [place], [place] [address]    Report Status 12/16/2021 FINAL  Final  Culture, blood (Routine X 2) w Reflex to ID Panel     Status: None   Collection Time: 12/11/21  6:02 PM   Specimen: BLOOD  Result Value Ref Range Status   Specimen Description BLOOD LEFT ANTECUBITAL  Final  Special Requests   Final    BOTTLES DRAWN AEROBIC AND ANAEROBIC Blood Culture adequate volume   Culture   Final    NO GROWTH 5 DAYS Performed at [facility], [place] [address], [place], [place] [address]    Report Status 12/16/2021 FINAL   Final  Culture, blood (Routine X 2) w Reflex to ID Panel     Status: None (Preliminary result)   Collection Time: 12/19/21  9:41 AM   Specimen: BLOOD  Result Value Ref Range Status   Specimen Description BLOOD SITE NOT SPECIFIED  Final   Special Requests   Final    BOTTLES DRAWN AEROBIC AND ANAEROBIC Blood Culture adequate volume   Culture   Final    NO GROWTH < 24 HOURS Performed at [facility], [place] [address] [place], [place] [address]    Report Status PENDING  Incomplete  Culture, blood (Routine X 2) w Reflex to ID Panel     Status: None (Preliminary result)   Collection Time: 12/19/21  9:41 AM   Specimen: BLOOD  Result Value Ref Range Status   Specimen Description BLOOD BLOOD LEFT HAND  Final   Special Requests   Final    BOTTLES DRAWN AEROBIC AND ANAEROBIC Blood Culture adequate volume   Culture   Final    NO GROWTH < 24 HOURS Performed at [facility], [place] [address], [place], [place] [address]    Report Status PENDING  Incomplete     Time coordinating discharge:  I have spent 35 minutes face to face with the patient and on the ward discussing the patients care, assessment, plan and disposition with other care givers. >50% of the time was devoted counseling the patient about the risks and benefits of treatment/Discharge disposition and coordinating care.   SIGNED:   Damita Lack, MD  [facility] 12/20/2021, 11:17 AM   If 7PM-7AM, please contact night-coverage

## 2021-12-20 NOTE — Progress Notes (Signed)
PROGRESS NOTE    Tyler [name]  JHE:[id number] DOB: 12-Jul-1950 DOA: 12/10/2021 PCP: [name], FNP   Brief Narrative:  71 y.o. male with past medical history of prostate cancer status post prostatectomy in 2021, history of bilateral pulmonary embolism after his surgery in 2021, essential hypertension presented with worsening back pain radiating down to right leg along with fevers and hematuria.  On presentation, his hemoglobin was 6.3. He was started on broad-spectrum antibiotics for possible UTI.  Subsequently, oncology was consulted for pancytopenia and neurosurgery was consulted for lumbosacral stenosis and severe lower back pain.  Patient is not a surgical candidate per neurosurgery.  He underwent bone marrow biopsy by IR on 12/14/2021.  Eventually biopsy showed concerns for myelodysplastic syndrome.   Assessment & Plan:  Principal Problem:   UTI (urinary tract infection) Active Problems:   Prostate cancer ([place])   Hypertension   Sepsis ([place])   Normocytic anemia   Thrombocytopenia ([facility])   Hyponatremia   Sepsis secondary to UTI ([facility])   Symptomatic anemia    Urosepsis -Urine cultures and blood cultures remain negative.  Patient completed total 7 days of cefepime.  Still off-and-on developing fever.  CT abdomen pelvis, MRI lumbar spine did not show any acute findings.  2D echocardiogram showed EF 75%, CTA chest is negative for PE. Currently patient is off antibiotics Recurrent fever= will repeat Blood cultures, check ProCal and CXR. UA is neg. I suspect fever is more transfusion related.    Pancytopenia /Symptomatic anemia: Myelodysplastic syndrome Undergone blood transfusion during recent admission and during this admission.  Normal folate and B12 levels.  Continue vitamin supplement including B complex.  Biopsy showing concerns of myelodysplastic syndrome.  Oncology following and to discuss plan with the patient and family members.    Abdominal pain: Continue PPI, LFTs,  lipase unremarkable.  CT abdomen pelvis negative   Severe lower back pain radiating to her right lower extremity Lumbosacral stenosis: -Chronic changes on the MRI.  Seen by neurosurgery, not a candidate for surgical intervention at this time.  Pain management.  PT/OT   History of bilateral PE in 2021 Previously thought to be provoked PE secondary to prostatectomy in 2021.  Treated with Eliquis   ETOH Abuse: -Heavy consumption in the last 2 to 3 months per patient's daughter.   No evidence of withdrawal currently.  Continue CIWA protocol.   Continue thiamine, multivitamins and folic acid.   History of prostate cancer status post prostatectomy in 2021 -Followed by Dr. [name] with urology.  PSA was undetectable earlier this month.   Results available in care everywhere.   Obesity -Outpatient follow-up Estimated body mass index is 28.9 kg/m as calculated from the following:   Height as of this encounter: 5' 7"  (1.702 m).   Weight as of this encounter: 83.7 kg.      DVT prophylaxis: SCDs Start: 12/10/21 1715 Code Status: Full code Family Communication:  Family at bedside.   Status is: Inpatient Remains inpatient appropriate because: Oncology to discuss bone marrow biopsy results with the family.  If his counts remain stable hopefully we can discharge him tomorrow   Nutritional status    Signs/Symptoms: meal completion < 50%  Interventions: Ensure Enlive (each supplement provides 350kcal and 20 grams of protein)  Body mass index is 28.9 kg/m.         Subjective: Afebrile overnight.  Cultures remain negative for 24 hours.  Denies any complaints or any obvious signs of bleeding Also spoke with patient's daughter over the  phone.  They have some questions going forward but I have deferred that and advised him to ask oncologist in regards to MDS Examination: Constitutional: Not in acute distress Respiratory: Clear to auscultation bilaterally Cardiovascular: Normal sinus  rhythm, no rubs Abdomen: Nontender nondistended good bowel sounds Musculoskeletal: No edema noted Skin: No rashes seen Neurologic: CN 2-12 grossly intact.  And nonfocal Psychiatric: Normal judgment and insight. Alert and oriented x 3. Normal mood. Objective: Vitals:   12/19/21 0532 12/19/21 0849 12/19/21 2100 12/20/21 0836  BP: 127/81 122/81 118/84 126/80  Pulse: 94 86 89 (!) 108  Resp: 20   18  Temp: (!) 100.5 F (38.1 C) 97.6 F (36.4 C) 99 F (37.2 C) 99.8 F (37.7 C)  TempSrc: Oral Oral Oral Oral  SpO2: 100% 100% 100% 100%  Weight:      Height:        Intake/Output Summary (Last 24 hours) at 12/20/2021 1027 Last data filed at 12/20/2021 0900 Gross per 24 hour  Intake 240 ml  Output 0 ml  Net 240 ml   Filed Weights   12/15/21 0225 12/16/21 0500 12/17/21 0500  Weight: 83.6 kg 82.9 kg 83.7 kg     Data Reviewed:   CBC: Recent Labs  Lab 12/14/21 0421 12/14/21 1014 12/15/21 0305 12/16/21 0220 12/17/21 0328 12/18/21 0444 12/19/21 0750 12/20/21 0456  WBC 2.3*  --  2.8* 2.2* 2.5* 2.2* 2.5* 2.4*  NEUTROABS 1.2*  --  1.6* 1.2* 1.4*  --   --   --   HGB 6.8*   < > 8.3* 7.2* 7.1* 6.9* 9.3* 8.5*  HCT 21.9*   < > 25.1* 21.7* 22.0* 20.8* 28.6* 25.1*  MCV 90.5  --  87.8 88.6 89.1 87.8 88.5 88.7  PLT 43*  --  39* 35* 37* 36* 41* 39*   < > = values in this interval not displayed.   Basic Metabolic Panel: Recent Labs  Lab 12/14/21 0421 12/15/21 0305 12/16/21 0220 12/17/21 0328 12/20/21 0456  NA 135 136 134* 134* 137  K 3.9 4.0 4.0 4.1 3.9  CL 105 105 101 103 101  CO2 19* 22 20* 21* 27  GLUCOSE 117* 106* 110* 104* 99  BUN 13 17 16 17 18   CREATININE 0.99 0.93 0.99 0.99 1.08  CALCIUM 8.2* 8.3* 8.2* 8.2* 8.2*  MG 1.7 1.8 1.7 1.9 1.9   GFR: Estimated Creatinine Clearance: 66.7 mL/min (by C-G formula based on SCr of 1.08 mg/dL). Liver Function Tests: Recent Labs  Lab 12/14/21 0421 12/15/21 0305 12/16/21 0220  AST 38 60* 39  ALT 26 43 36  ALKPHOS 119 119  107  BILITOT 1.5* 1.8* 1.6*  PROT 5.1* 5.5* 5.1*  ALBUMIN 2.0* 2.2* 2.0*   No results for input(s): "LIPASE", "AMYLASE" in the last 168 hours. No results for input(s): "AMMONIA" in the last 168 hours. Coagulation Profile: No results for input(s): "INR", "PROTIME" in the last 168 hours. Cardiac Enzymes: No results for input(s): "CKTOTAL", "CKMB", "CKMBINDEX", "TROPONINI" in the last 168 hours. BNP (last 3 results) No results for input(s): "PROBNP" in the last 8760 hours. HbA1C: No results for input(s): "HGBA1C" in the last 72 hours. CBG: No results for input(s): "GLUCAP" in the last 168 hours. Lipid Profile: No results for input(s): "CHOL", "HDL", "LDLCALC", "TRIG", "CHOLHDL", "LDLDIRECT" in the last 72 hours. Thyroid Function Tests: No results for input(s): "TSH", "T4TOTAL", "FREET4", "T3FREE", "THYROIDAB" in the last 72 hours. Anemia Panel: No results for input(s): "VITAMINB12", "FOLATE", "FERRITIN", "TIBC", "IRON", "RETICCTPCT" in the  last 72 hours. Sepsis Labs: Recent Labs  Lab 12/19/21 0750  PROCALCITON 0.18    Recent Results (from the past 240 hour(s))  Resp Panel by RT-PCR (Flu A&B, Covid) Anterior Nasal Swab     Status: None   Collection Time: 12/10/21 12:16 PM   Specimen: Anterior Nasal Swab  Result Value Ref Range Status   SARS Coronavirus 2 by RT PCR NEGATIVE NEGATIVE Final    Comment: (NOTE) SARS-CoV-2 target nucleic acids are NOT DETECTED.  The SARS-CoV-2 RNA is generally detectable in upper respiratory specimens during the acute phase of infection. The lowest concentration of SARS-CoV-2 viral copies this assay can detect is 138 copies/mL. A negative result does not preclude SARS-Cov-2 infection and should not be used as the sole basis for treatment or other patient management decisions. A negative result may occur with  improper specimen collection/handling, submission of specimen other than nasopharyngeal swab, presence of viral mutation(s) within  the areas targeted by this assay, and inadequate number of viral copies(<138 copies/mL). A negative result must be combined with clinical observations, patient history, and epidemiological information. The expected result is Negative.  Fact Sheet for Patients:  EntrepreneurPulse.com.au  Fact Sheet for Healthcare Providers:  IncredibleEmployment.be  This test is no t yet approved or cleared by the [place] FDA and  has been authorized for detection and/or diagnosis of SARS-CoV-2 by FDA under an Emergency Use Authorization (EUA). This EUA will remain  in effect (meaning this test can be used) for the duration of the COVID-19 declaration under Section 564(b)(1) of the Act, 21 U.S.C.section 360bbb-3(b)(1), unless the authorization is terminated  or revoked sooner.       Influenza A by PCR NEGATIVE NEGATIVE Final   Influenza B by PCR NEGATIVE NEGATIVE Final    Comment: (NOTE) The Xpert Xpress SARS-CoV-2/FLU/RSV plus assay is intended as an aid in the diagnosis of influenza from Nasopharyngeal swab specimens and should not be used as a sole basis for treatment. Nasal washings and aspirates are unacceptable for Xpert Xpress SARS-CoV-2/FLU/RSV testing.  Fact Sheet for Patients: EntrepreneurPulse.com.au  Fact Sheet for Healthcare Providers: IncredibleEmployment.be  This test is not yet approved or cleared by the [place] FDA and has been authorized for detection and/or diagnosis of SARS-CoV-2 by FDA under an Emergency Use Authorization (EUA). This EUA will remain in effect (meaning this test can be used) for the duration of the COVID-19 declaration under Section 564(b)(1) of the Act, 21 U.S.C. section 360bbb-3(b)(1), unless the authorization is terminated or revoked.  Performed at [facility], [place] [address], [place], [place] [address]   Urine Culture     Status: None   Collection Time:  12/11/21  6:55 AM   Specimen: Urine, Clean Catch  Result Value Ref Range Status   Specimen Description URINE, CLEAN CATCH  Final   Special Requests NONE  Final   Culture   Final    NO GROWTH Performed at [facility], [place] [address], [place], [place] [address]    Report Status 12/12/2021 FINAL  Final  Culture, blood (Routine X 2) w Reflex to ID Panel     Status: None   Collection Time: 12/11/21  6:55 AM   Specimen: BLOOD RIGHT HAND  Result Value Ref Range Status   Specimen Description BLOOD RIGHT HAND  Final   Special Requests   Final    BOTTLES DRAWN AEROBIC AND ANAEROBIC Blood Culture adequate volume   Culture   Final    NO GROWTH  5 DAYS Performed at [facility], [place] [address] [place], [place] [address]    Report Status 12/16/2021 FINAL  Final  Culture, blood (Routine X 2) w Reflex to ID Panel     Status: None   Collection Time: 12/11/21  6:02 PM   Specimen: BLOOD  Result Value Ref Range Status   Specimen Description BLOOD LEFT ANTECUBITAL  Final   Special Requests   Final    BOTTLES DRAWN AEROBIC AND ANAEROBIC Blood Culture adequate volume   Culture   Final    NO GROWTH 5 DAYS Performed at [facility], [place] [address], [place], [place] [address]    Report Status 12/16/2021 FINAL  Final  Culture, blood (Routine X 2) w Reflex to ID Panel     Status: None (Preliminary result)   Collection Time: 12/19/21  9:41 AM   Specimen: BLOOD  Result Value Ref Range Status   Specimen Description BLOOD SITE NOT SPECIFIED  Final   Special Requests   Final    BOTTLES DRAWN AEROBIC AND ANAEROBIC Blood Culture adequate volume   Culture   Final    NO GROWTH < 24 HOURS Performed at [facility], [place] [address], [place], [place] [address]    Report Status PENDING  Incomplete  Culture, blood (Routine X 2) w Reflex to ID Panel     Status: None (Preliminary result)   Collection Time: 12/19/21  9:41 AM   Specimen: BLOOD  Result Value Ref Range Status   Specimen  Description BLOOD BLOOD LEFT HAND  Final   Special Requests   Final    BOTTLES DRAWN AEROBIC AND ANAEROBIC Blood Culture adequate volume   Culture   Final    NO GROWTH < 24 HOURS Performed at [facility], [place] [address], [place], [place] [address]    Report Status PENDING  Incomplete         Radiology Studies: DG Chest Port 1 View  Result Date: 12/19/2021 CLINICAL DATA:  Shortness of breath EXAM: PORTABLE CHEST 1 VIEW COMPARISON:  12/10/2021 FINDINGS: Heart size upper limits of normal. Aortic tortuosity. Poor inspiration. Allowing for that the lungs are clear. The vascularity is normal. No effusions. IMPRESSION: Poor inspiration. No active disease suspected. Aortic tortuosity. Electronically Signed   By: Nelson Chimes M.D.   On: 12/19/2021 09:34        Scheduled Meds:  B-complex with vitamin C  1 tablet Oral Daily   feeding supplement  237 mL Oral BID BM   folic acid  1 mg Oral Daily   lidocaine  1 patch Transdermal Q24H   metoprolol tartrate  12.5 mg Oral BID   multivitamin with minerals  1 tablet Oral Daily   pantoprazole  40 mg Oral BID   senna-docusate  1 tablet Oral BID   thiamine  100 mg Oral Daily   Continuous Infusions:   LOS: 10 days   Time spent= 35 mins    Rashawn Rolon Arsenio Loader, MD [facility]  If 7PM-7AM, please contact night-coverage  12/20/2021, 10:27 AM

## 2021-12-20 NOTE — TOC Transition Note (Signed)
Transition of Care [facility]) - CM/SW Discharge Note   Patient Details  Name: Tyler [name] MRN: [id number] Date of Birth: April 10, 1950  [facility] CM/SW Contact:  Tom-Johnson, Renea Ee, RN Phone Number: 12/20/2021, 12:14 PM   Clinical Narrative:     Patient is scheduled for discharge today. Outpatient PT info and outpatient hosp f/u info on AVS. Denies any needs. Family to transport at discharge. No further TOC needs noted.   Final next level of care: OP Rehab Barriers to Discharge: Barriers Resolved   Patient Goals and CMS Choice Patient states their goals for this hospitalization and ongoing recovery are:: To return home CMS Medicare.gov Compare Post Acute Care list provided to:: Patient Choice offered to / list presented to : Patient, Spouse  Discharge Placement                Patient to be transferred to facility by: Family      Discharge Plan and Services   Discharge Planning Services: CM Consult Post Acute Care Choice: NA          DME Arranged: N/A DME Agency: NA       HH Arranged: NA HH Agency: NA        Social Determinants of Health (SDOH) Interventions     Readmission Risk Interventions     No data to display

## 2021-12-20 NOTE — Progress Notes (Signed)
DISCHARGE NOTE HOME Tyler [name] to be discharged Home per MD order. Discussed prescriptions and follow up appointments with the patient and daughter [name]. Prescriptions given to patient; medication list explained in detail. Patient and [name] verbalized understanding.  Skin clean, dry and intact without evidence of skin break down, no evidence of skin tears noted. IV catheter discontinued intact. Site without signs and symptoms of complications. Dressing and pressure applied. Pt denies pain at the site currently. No complaints noted.  Patient free of lines, drains, and wounds.   An After Visit Summary (AVS) was printed and given to the patient. Patient escorted via wheelchair, and discharged home via private auto.  Anastasio Auerbach, RN

## 2021-12-20 NOTE — Plan of Care (Signed)
  Problem: Fluid Volume: Goal: Hemodynamic stability will improve Outcome: Progressing   Problem: Clinical Measurements: Goal: Diagnostic test results will improve Outcome: Progressing Goal: Signs and symptoms of infection will decrease Outcome: Progressing   Problem: Respiratory: Goal: Ability to maintain adequate ventilation will improve Outcome: Progressing   Problem: Urinary Elimination: Goal: Signs and symptoms of infection will decrease Outcome: Progressing   Problem: Education: Goal: Knowledge of General Education information will improve Description: Including pain rating scale, medication(s)/side effects and non-pharmacologic comfort measures Outcome: Progressing   Problem: Activity: Goal: Risk for activity intolerance will decrease Outcome: Progressing   Problem: Nutrition: Goal: Adequate nutrition will be maintained Outcome: Progressing   Problem: Coping: Goal: Level of anxiety will decrease Outcome: Progressing   Problem: Pain Managment: Goal: General experience of comfort will improve Outcome: Progressing   Problem: Safety: Goal: Ability to remain free from injury will improve Outcome: Progressing   Problem: Education: Goal: Knowledge of General Education information will improve Description: Including pain rating scale, medication(s)/side effects and non-pharmacologic comfort measures Outcome: Progressing   Problem: Health Behavior/Discharge Planning: Goal: Ability to manage health-related needs will improve Outcome: Progressing   Problem: Clinical Measurements: Goal: Ability to maintain clinical measurements within normal limits will improve Outcome: Progressing Goal: Will remain free from infection Outcome: Progressing Goal: Diagnostic test results will improve Outcome: Progressing Goal: Respiratory complications will improve Outcome: Progressing Goal: Cardiovascular complication will be avoided Outcome: Progressing   Problem:  Activity: Goal: Risk for activity intolerance will decrease Outcome: Progressing   Problem: Nutrition: Goal: Adequate nutrition will be maintained Outcome: Progressing   Problem: Coping: Goal: Level of anxiety will decrease Outcome: Progressing   Problem: Elimination: Goal: Will not experience complications related to bowel motility Outcome: Progressing Goal: Will not experience complications related to urinary retention Outcome: Progressing   Problem: Pain Managment: Goal: General experience of comfort will improve Outcome: Progressing   Problem: Safety: Goal: Ability to remain free from injury will improve Outcome: Progressing   Problem: Skin Integrity: Goal: Risk for impaired skin integrity will decrease Outcome: Progressing

## 2021-12-20 NOTE — Progress Notes (Addendum)
Hematology Short note  Called and discussed BM Bx results with [name] and discussed findings ofMDS with Multilineage dysplasia. Currently PRBC transfusion dependent. 1 unit of PRBC today then can discharge if otherwise stable. Absolutely no NSAIDS or ETOH. Will setup for labs, clinic visit and 1 unit of PRBC as outpatient in oncology clinic on Monday or Tuesday. EPO levels added on Will need outpatient MDS and MPN panel with outpatient labs  Sullivan Lone MD MS

## 2021-12-21 LAB — BPAM RBC
Blood Product Expiration Date: 202311232359
Blood Product Expiration Date: 202311232359
Blood Product Expiration Date: 202311262359
ISSUE DATE / TIME: 202310241340
ISSUE DATE / TIME: 202310241807
ISSUE DATE / TIME: 202310261152
Unit Type and Rh: 5100
Unit Type and Rh: 5100
Unit Type and Rh: 5100

## 2021-12-21 LAB — TYPE AND SCREEN
ABO/RH(D): O POS
Antibody Screen: NEGATIVE
Unit division: 0
Unit division: 0
Unit division: 0

## 2021-12-24 ENCOUNTER — Encounter (HOSPITAL_COMMUNITY): Payer: Self-pay | Admitting: Hematology

## 2021-12-24 LAB — CULTURE, BLOOD (ROUTINE X 2)
Culture: NO GROWTH
Culture: NO GROWTH
Special Requests: ADEQUATE
Special Requests: ADEQUATE

## 2021-12-25 ENCOUNTER — Other Ambulatory Visit: Payer: Self-pay

## 2021-12-25 DIAGNOSIS — I2699 Other pulmonary embolism without acute cor pulmonale: Secondary | ICD-10-CM

## 2021-12-25 NOTE — ED Provider Notes (Signed)
CRITICAL CARE Performed by: Kemper Durie  Patient required blood transfusion for new anemia in addition to treatment of sepsis.  Total critical care time: 30 minutes  Critical care time was exclusive of separately billable procedures and treating other patients.  Critical care was necessary to treat or prevent imminent or life-threatening deterioration.  Critical care was time spent personally by me on the following activities: development of treatment plan with patient and/or surrogate as well as nursing, discussions with consultants, evaluation of patient's response to treatment, examination of patient, obtaining history from patient or surrogate, ordering and performing treatments and interventions, ordering and review of laboratory studies, ordering and review of radiographic studies, pulse oximetry and re-evaluation of patient's condition.    Kemper Durie, DO 12/25/21 [phone]

## 2021-12-26 ENCOUNTER — Inpatient Hospital Stay: Payer: 59

## 2021-12-26 ENCOUNTER — Inpatient Hospital Stay: Payer: 59 | Admitting: Hematology

## 2021-12-26 ENCOUNTER — Inpatient Hospital Stay: Payer: 59 | Attending: Hematology

## 2021-12-26 ENCOUNTER — Other Ambulatory Visit: Payer: Self-pay

## 2021-12-26 VITALS — BP 140/82 | HR 107 | Temp 98.7°F | Resp 16 | Ht 67.0 in | Wt 187.8 lb

## 2021-12-26 DIAGNOSIS — I2699 Other pulmonary embolism without acute cor pulmonale: Secondary | ICD-10-CM

## 2021-12-26 DIAGNOSIS — D61818 Other pancytopenia: Secondary | ICD-10-CM | POA: Insufficient documentation

## 2021-12-26 DIAGNOSIS — Z8546 Personal history of malignant neoplasm of prostate: Secondary | ICD-10-CM | POA: Diagnosis not present

## 2021-12-26 DIAGNOSIS — Z86711 Personal history of pulmonary embolism: Secondary | ICD-10-CM | POA: Diagnosis not present

## 2021-12-26 DIAGNOSIS — M5431 Sciatica, right side: Secondary | ICD-10-CM

## 2021-12-26 DIAGNOSIS — D469 Myelodysplastic syndrome, unspecified: Secondary | ICD-10-CM

## 2021-12-26 LAB — CMP (CANCER CENTER ONLY)
ALT: 47 U/L — ABNORMAL HIGH (ref 0–44)
AST: 36 U/L (ref 15–41)
Albumin: 3 g/dL — ABNORMAL LOW (ref 3.5–5.0)
Alkaline Phosphatase: 172 U/L — ABNORMAL HIGH (ref 38–126)
Anion gap: 8 (ref 5–15)
BUN: 14 mg/dL (ref 8–23)
CO2: 28 mmol/L (ref 22–32)
Calcium: 8.2 mg/dL — ABNORMAL LOW (ref 8.9–10.3)
Chloride: 102 mmol/L (ref 98–111)
Creatinine: 0.86 mg/dL (ref 0.61–1.24)
GFR, Estimated: >60 mL/min (ref >60)
Glucose, Bld: 122 mg/dL — ABNORMAL HIGH (ref 70–99)
Potassium: 3.6 mmol/L (ref 3.5–5.1)
Sodium: 138 mmol/L (ref 135–145)
Total Bilirubin: 1.3 mg/dL — ABNORMAL HIGH (ref 0.3–1.2)
Total Protein: 6.1 g/dL — ABNORMAL LOW (ref 6.5–8.1)

## 2021-12-26 LAB — CBC WITH DIFFERENTIAL (CANCER CENTER ONLY)
Abs Immature Granulocytes: 0.01 10*3/uL (ref 0.00–0.07)
Basophils Absolute: 0 10*3/uL (ref 0.0–0.1)
Basophils Relative: 0 %
Eosinophils Absolute: 0 10*3/uL (ref 0.0–0.5)
Eosinophils Relative: 0 %
HCT: 27.6 % — ABNORMAL LOW (ref 39.0–52.0)
Hemoglobin: 8.9 g/dL — ABNORMAL LOW (ref 13.0–17.0)
Immature Granulocytes: 0 %
Lymphocytes Relative: 30 %
Lymphs Abs: 0.8 10*3/uL (ref 0.7–4.0)
MCH: 29.3 pg (ref 26.0–34.0)
MCHC: 32.2 g/dL (ref 30.0–36.0)
MCV: 90.8 fL (ref 80.0–100.0)
Monocytes Absolute: 0.4 10*3/uL (ref 0.1–1.0)
Monocytes Relative: 17 %
Neutro Abs: 1.3 10*3/uL — ABNORMAL LOW (ref 1.7–7.7)
Neutrophils Relative %: 53 %
Platelet Count: 55 10*3/uL — ABNORMAL LOW (ref 150–400)
RBC: 3.04 MIL/uL — ABNORMAL LOW (ref 4.22–5.81)
RDW: 18.6 % — ABNORMAL HIGH (ref 11.5–15.5)
WBC Count: 2.6 10*3/uL — ABNORMAL LOW (ref 4.0–10.5)
nRBC: 0 % (ref 0.0–0.2)

## 2021-12-26 LAB — SAMPLE TO BLOOD BANK

## 2021-12-26 LAB — MAGNESIUM: Magnesium: 1.8 mg/dL (ref 1.7–2.4)

## 2021-12-26 MED ORDER — CLOTRIMAZOLE-BETAMETHASONE 1-0.05 % EX CREA: 1.0000 | TOPICAL_CREAM | Freq: Two times a day (BID) | CUTANEOUS | 0 refills | Status: DC

## 2021-12-26 MED ORDER — VITAMIN D 25 MCG (1000 UNIT) PO TABS: 2000.0000 [IU] | ORAL_TABLET | Freq: Every day | ORAL | Status: DC

## 2022-01-02 NOTE — Progress Notes (Incomplete)
[name]   HEMATOLOGY/ONCOLOGY CONSULTATION NOTE  Date of Service: 01/02/2022  Patient Care Team: [name], FNP as PCP - General (Family Medicine)  CHIEF COMPLAINTS/PURPOSE OF CONSULTATION:  ***  HISTORY OF PRESENTING ILLNESS:  Dawan Farney is a wonderful 71 y.o. male who has been referred to [place] by Dr Merryl Hacker for evaluation and management of ***  MEDICAL HISTORY:  Past Medical History:  Diagnosis Date   Cancer [facility])    prostate   Hypertension     SURGICAL HISTORY: Past Surgical History:  Procedure Laterality Date   LYMPHADENECTOMY Bilateral 09/01/2019   Procedure: LYMPHADENECTOMY;  Surgeon: Alexis Frock, MD;  Location: [facility];  Service: Urology;  Laterality: Bilateral;   ROBOT ASSISTED LAPAROSCOPIC RADICAL PROSTATECTOMY N/A 09/01/2019   Procedure: XI ROBOTIC ASSISTED LAPAROSCOPIC RADICAL PROSTATECTOMY;  Surgeon: Alexis Frock, MD;  Location: [facility];  Service: Urology;  Laterality: N/A;  3 HRS    SOCIAL HISTORY: Social History   Socioeconomic History   Marital status: Married    Spouse name: Not on file   Number of children: Not on file   Years of education: Not on file   Highest education level: Not on file  Occupational History   Not on file  Tobacco Use   Smoking status: Never   Smokeless tobacco: Never  Vaping Use   Vaping Use: Never used  Substance and Sexual Activity   Alcohol use: Yes    Comment: at least one 40 oz beer daily   Drug use: Never   Sexual activity: Not on file  Other Topics Concern   Not on file  Social History Narrative   Not on file   Social Determinants of Health   Financial Resource Strain: Not on file  Food Insecurity: No Food Insecurity (12/18/2021)   Hunger Vital Sign    Worried About Running Out of Food in the Last Year: Never true    Ran Out of Food in the Last Year: Never true  Transportation Needs: No Transportation Needs (12/18/2021)   PRAPARE - Hydrologist (Medical): No    Lack of  Transportation (Non-Medical): No  Physical Activity: Not on file  Stress: Not on file  Social Connections: Not on file  Intimate Partner Violence: Not At Risk (12/18/2021)   Humiliation, Afraid, Rape, and Kick questionnaire    Fear of Current or Ex-Partner: No    Emotionally Abused: No    Physically Abused: No    Sexually Abused: No    FAMILY HISTORY: Family History  Problem Relation Age of Onset   Stroke Father     ALLERGIES:  is allergic to amlodipine.  MEDICATIONS:  Current Outpatient Medications  Medication Sig Dispense Refill   B Complex Vitamins (VITAMIN B COMPLEX) TABS Take 1 tablet by mouth daily. 30 tablet 0   baclofen (LIORESAL) 10 MG tablet Take 10 mg by mouth 3 (three) times daily.     cholecalciferol (VITAMIN D3) 25 MCG (1000 UNIT) tablet Take 2 tablets (2,000 Units total) by mouth daily.     clotrimazole-betamethasone (LOTRISONE) cream Apply 1 Application topically 2 (two) times daily. 45 g 0   folic acid (FOLVITE) 1 MG tablet Take 1 tablet (1 mg total) by mouth daily. 30 tablet 0   lisinopril (ZESTRIL) 5 MG tablet Take 5 mg by mouth daily.     Menthol-Methyl Salicylate (MUSCLE RUB) 10-15 % CREA Apply 1 Application topically daily as needed for muscle pain.     metoprolol tartrate (LOPRESSOR) 25  MG tablet Take 0.5 tablets (12.5 mg total) by mouth 2 (two) times daily. 60 tablet 0   Multiple Vitamin (MULTIVITAMIN WITH MINERALS) TABS tablet Take 1 tablet by mouth daily.     ondansetron (ZOFRAN-ODT) 4 MG disintegrating tablet 74m ODT q4 hours prn nausea/vomit 20 tablet 0   pantoprazole (PROTONIX) 40 MG tablet Take 1 tablet (40 mg total) by mouth daily before breakfast. 30 tablet 0   polyethylene glycol powder (GLYCOLAX/MIRALAX) 17 GM/SCOOP powder Take 17 g by mouth daily as needed for moderate constipation. 238 g 0   senna-docusate (SENOKOT-S) 8.6-50 MG tablet Take 2 tablets by mouth at bedtime as needed for mild constipation. 60 tablet 0   thiamine (VITAMIN B1) 100 MG  tablet Take 1 tablet (100 mg total) by mouth daily. 30 tablet 0   No current facility-administered medications for this visit.    REVIEW OF SYSTEMS:    10 Point review of Systems was done is negative except as noted above.  PHYSICAL EXAMINATION: ECOG PERFORMANCE STATUS: {CHL ONC ECOG PLK:[id number]} . Vitals:   12/26/21 1016  BP: (!) 140/82  Pulse: (!) 107  Resp: 16  Temp: 98.7 F (37.1 C)  SpO2: 98%   Filed Weights   12/26/21 1016  Weight: 187 lb 12.8 oz (85.2 kg)   .Body mass index is 29.41 kg/m.  GENERAL:alert, in no acute distress and comfortable SKIN: no acute rashes, no significant lesions EYES: conjunctiva are pink and non-injected, sclera anicteric OROPHARYNX: MMM, no exudates, no oropharyngeal erythema or ulceration NECK: supple, no JVD LYMPH:  no palpable lymphadenopathy in the cervical, axillary or inguinal regions LUNGS: clear to auscultation b/l with normal respiratory effort HEART: regular rate & rhythm ABDOMEN:  normoactive bowel sounds , non tender, not distended. Extremity: no pedal edema PSYCH: alert & oriented x 3 with fluent speech NEURO: no focal motor/sensory deficits  LABORATORY DATA:  I have reviewed the data as listed  .    Latest Ref Rng & Units 12/26/2021    9:24 AM 12/20/2021    4:56 AM 12/19/2021    7:50 AM  CBC  WBC 4.0 - 10.5 K/uL 2.6  2.4  2.5   Hemoglobin 13.0 - 17.0 g/dL 8.9  8.5  9.3   Hematocrit 39.0 - 52.0 % 27.6  25.1  28.6   Platelets 150 - 400 K/uL 55  39  41     .    Latest Ref Rng & Units 12/26/2021    9:24 AM 12/20/2021    4:56 AM 12/17/2021    3:28 AM  CMP  Glucose 70 - 99 mg/dL 122  99  104   BUN 8 - 23 mg/dL _0 Creatinine 0.61 - 1.24 mg/dL 0.86  1.08  0.99   Sodium 135 - 145 mmol/L 138  137  134   Potassium 3.5 - 5.1 mmol/L 3.6  3.9  4.1   Chloride 98 - 111 mmol/L 102  101  103   CO2 22 - 32 mmol/L _1 Calcium 8.9 - 10.3 mg/dL 8.2  8.2  8.2   Total Protein 6.5 - 8.1 g/dL 6.1      Total Bilirubin 0.3 - 1.2 mg/dL 1.3     Alkaline Phos 38 - 126 U/L 172     AST 15 - 41 U/L 36     ALT 0 - 44 U/L 47        RADIOGRAPHIC STUDIES: I have personally  reviewed the radiological images as listed and agreed with the findings in the report. DG Chest Port 1 View  Result Date: 12/19/2021 CLINICAL DATA:  Shortness of breath EXAM: PORTABLE CHEST 1 VIEW COMPARISON:  12/10/2021 FINDINGS: Heart size upper limits of normal. Aortic tortuosity. Poor inspiration. Allowing for that the lungs are clear. The vascularity is normal. No effusions. IMPRESSION: Poor inspiration. No active disease suspected. Aortic tortuosity. Electronically Signed   By: Nelson Chimes M.D.   On: 12/19/2021 09:34   ECHOCARDIOGRAM COMPLETE  Result Date: 12/16/2021    ECHOCARDIOGRAM REPORT   Patient Name:   CALLAHAN WILD Date of Exam: 12/16/2021 Medical Rec #:  [id number]    Height:       67.0 in Accession #:    [id number]   Weight:       182.8 lb Date of Birth:  20-Jul-1950    BSA:          1.946 m Patient Age:    85 years     BP:           123/71 mmHg Patient Gender: M            HR:           99 bpm. Exam Location:  Inpatient Procedure: 2D Echo, Cardiac Doppler, Color Doppler and Intracardiac            Opacification Agent Indications:    Fever R50.9  History:        Patient has prior history of Echocardiogram examinations, most                 recent 09/06/2019. Sepsis; Risk Factors:Hypertension and                 Non-Smoker.  Sonographer:    Greer Pickerel Referring Phys: [id number] [facility]  Sonographer Comments: Technically difficult study due to poor echo windows and suboptimal apical window. Image acquisition challenging due to patient body habitus and Image acquisition challenging due to respiratory motion. IMPRESSIONS  1. Left ventricular ejection fraction, by estimation, is >75%. The left ventricle has hyperdynamic function. The left ventricle has no regional wall motion abnormalities. There is mild left ventricular  hypertrophy. Left ventricular diastolic parameters are consistent with Grade I diastolic dysfunction (impaired relaxation).  2. Right ventricular systolic function is normal. The right ventricular size is normal. Tricuspid regurgitation signal is inadequate for assessing PA pressure.  3. The mitral valve is normal in structure. No evidence of mitral valve regurgitation. No evidence of mitral stenosis.  4. The aortic valve has an indeterminant number of cusps. Aortic valve regurgitation is not visualized. No aortic stenosis is present.  5. The inferior vena cava is normal in size with greater than 50% respiratory variability, suggesting right atrial pressure of 3 mmHg. FINDINGS  Left Ventricle: Left ventricular ejection fraction, by estimation, is >75%. The left ventricle has hyperdynamic function. The left ventricle has no regional wall motion abnormalities. The left ventricular internal cavity size was normal in size. There is mild left ventricular hypertrophy. Left ventricular diastolic parameters are consistent with Grade I diastolic dysfunction (impaired relaxation). Normal left ventricular filling pressure. Right Ventricle: The right ventricular size is normal. Right vetricular wall thickness was not well visualized. Right ventricular systolic function is normal. Tricuspid regurgitation signal is inadequate for assessing PA pressure. Left Atrium: Left atrial size was normal in size. Right Atrium: Right atrial size was not well visualized. Pericardium: There is no evidence of pericardial effusion. Mitral Valve: The  mitral valve is normal in structure. No evidence of mitral valve regurgitation. No evidence of mitral valve stenosis. Tricuspid Valve: The tricuspid valve is normal in structure. Tricuspid valve regurgitation is not demonstrated. No evidence of tricuspid stenosis. Aortic Valve: The aortic valve has an indeterminant number of cusps. Aortic valve regurgitation is not visualized. No aortic stenosis is  present. Aortic valve mean gradient measures 4.0 mmHg. Aortic valve peak gradient measures 10.0 mmHg. Aortic valve area, by VTI measures 2.52 cm. Pulmonic Valve: The pulmonic valve was not well visualized. Pulmonic valve regurgitation is not visualized. No evidence of pulmonic stenosis. Aorta: The aortic root and ascending aorta are structurally normal, with no evidence of dilitation. Venous: The inferior vena cava is normal in size with greater than 50% respiratory variability, suggesting right atrial pressure of 3 mmHg. IAS/Shunts: No atrial level shunt detected by color flow Doppler.  LEFT VENTRICLE PLAX 2D LVIDd:         3.10 cm   Diastology LVIDs:         1.80 cm   LV e' medial:    10.40 cm/s LV PW:         1.20 cm   LV E/e' medial:  4.5 LV IVS:        1.00 cm   LV e' lateral:   10.90 cm/s LVOT diam:     2.10 cm   LV E/e' lateral: 4.2 LV SV:         61 LV SV Index:   31 LVOT Area:     3.46 cm  RIGHT VENTRICLE RV S prime:     23.60 cm/s TAPSE (M-mode): 1.8 cm LEFT ATRIUM             Index        RIGHT ATRIUM           Index LA diam:        2.60 cm 1.34 cm/m   RA Area:     11.70 cm LA Vol (A2C):   66.9 ml 34.37 ml/m  RA Volume:   24.20 ml  12.43 ml/m LA Vol (A4C):   36.8 ml 18.91 ml/m LA Biplane Vol: 52.3 ml 26.87 ml/m  AORTIC VALVE AV Area (Vmax):    2.43 cm AV Area (Vmean):   2.92 cm AV Area (VTI):     2.52 cm AV Vmax:           158.36 cm/s AV Vmean:          88.501 cm/s AV VTI:            0.243 m AV Peak Grad:      10.0 mmHg AV Mean Grad:      4.0 mmHg LVOT Vmax:         111.00 cm/s LVOT Vmean:        74.600 cm/s LVOT VTI:          0.177 m LVOT/AV VTI ratio: 0.73  AORTA Ao Root diam: 3.30 cm Ao Asc diam:  3.40 cm MITRAL VALVE MV Area (PHT): 4.06 cm    SHUNTS MV Decel Time: 187 msec    Systemic VTI:  0.18 m MV E velocity: 46.30 cm/s  Systemic Diam: 2.10 cm MV A velocity: 75.80 cm/s MV E/A ratio:  0.61 Carlyle Dolly MD Electronically signed by Carlyle Dolly MD Signature Date/Time:  12/16/2021/12:16:02 PM    Final    CT Angio Chest Pulmonary Embolism (PE) W or WO Contrast  Result Date: 12/15/2021 CLINICAL DATA:  Pulmonary embolism (PE) suspected, high prob EXAM: CT ANGIOGRAPHY CHEST WITH CONTRAST TECHNIQUE: Multidetector CT imaging of the chest was performed using the standard protocol during bolus administration of intravenous contrast. Multiplanar CT image reconstructions and MIPs were obtained to evaluate the vascular anatomy. RADIATION DOSE REDUCTION: This exam was performed according to the departmental dose-optimization program which includes automated exposure control, adjustment of the mA and/or kV according to patient size and/or use of iterative reconstruction technique. CONTRAST:  46m OMNIPAQUE IOHEXOL 350 MG/ML SOLN COMPARISON:  X-ray 12/10/2021.  CT 09/05/2019 FINDINGS: Cardiovascular: Satisfactory opacification of the pulmonary arteries to the segmental level. No evidence of pulmonary embolism. Thoracic aorta is nonaneurysmal. Normal heart size. No pericardial effusion. Mediastinum/Nodes: No enlarged mediastinal, hilar, or axillary lymph nodes. Thyroid gland, trachea, and esophagus demonstrate no significant findings. Lungs/Pleura: No airspace consolidation. No pleural effusion or pneumothorax. Upper Abdomen: No acute abnormality. Musculoskeletal: No chest wall abnormality. No acute or significant osseous findings. Review of the MIP images confirms the above findings. IMPRESSION: No evidence of pulmonary embolism or other acute intrathoracic findings. Electronically Signed   By: NDavina PokeD.O.   On: 12/15/2021 16:21   CT BONE MARROW BIOPSY & ASPIRATION  Result Date: 12/14/2021 INDICATION: Pancytopenia EXAM: CT BONE MARROW BIOPSY AND ASPIRATION MEDICATIONS: None. ANESTHESIA/SEDATION: Moderate (conscious) sedation was employed during this procedure. A total of Versed 1.5 mg and Fentanyl 75 mcg was administered intravenously. Moderate Sedation Time: 11 minutes. The  patient's level of consciousness and vital signs were monitored continuously by radiology nursing throughout the procedure under my direct supervision. FLUOROSCOPY TIME:  N/a COMPLICATIONS: None immediate. PROCEDURE: Informed written consent was obtained from the patient after a thorough discussion of the procedural risks, benefits and alternatives. All questions were addressed. Maximal Sterile Barrier Technique was utilized including caps, mask, sterile gowns, sterile gloves, sterile drape, hand hygiene and skin antiseptic. A timeout was performed prior to the initiation of the procedure. The patient was placed prone on the CT exam table. Limited CT of the pelvis was performed for planning purposes. Skin entry site was marked, and the overlying skin was prepped and draped in the standard sterile fashion. Local analgesia was obtained with 1% lidocaine. Using CT guidance, an 11 gauge needle was advanced just deep to the cortex of the right posterior ilium. Subsequently, bone marrow aspiration and core biopsy were performed. Specimens were submitted to lab/pathology for handling. Hemostasis was achieved with manual pressure, and a clean dressing was placed. The patient tolerated the procedure well without immediate complication. IMPRESSION: Successful CT-guided bone marrow aspiration and core biopsy of the right posterior ilium. Electronically Signed   By: YAlbin FellingM.D.   On: 12/14/2021 10:24   MR LUMBAR SPINE WO CONTRAST  Result Date: 12/11/2021 CLINICAL DATA:  Low back pain EXAM: MRI LUMBAR SPINE WITHOUT CONTRAST TECHNIQUE: Multiplanar, multisequence MR imaging of the lumbar spine was performed. No intravenous contrast was administered. COMPARISON:  No prior MRI of the lumbar spine, correlation is made with 11/18/2021 lumbar spine radiographs and 12/10/2021 CT abdomen pelvis FINDINGS: Segmentation:  5 lumbar-type vertebral bodies. Alignment:  Mild levocurvature.  No listhesis. Vertebrae: No acute fracture  or suspicious osseous lesion. Diffusely decreased marrow signal, which on T1 is slightly hyperintense to skeletal muscle, likely red marrow reconversion. Speckled marrow signal in the bilateral iliac bones and, to a lesser extent, vertebral bodies, which is favored to be benign given its appearance on the 12/10/2021 CT. Conus medullaris and cauda equina: Conus extends to the T12-L1 level.  Conus and cauda equina appear normal. Paraspinal and other soft tissues: Right renal cyst, better evaluated on the prior CT, for which no follow-up is currently indicated. Disc levels: T12-L1: Disc desiccation without significant disc bulge. No spinal canal stenosis or neural foraminal narrowing. L1-L2: No significant disc bulge. No spinal canal stenosis or neural foraminal narrowing. L2-L3: Disc desiccation and minimal disc bulge with central annular fissure. Mild facet arthropathy. No spinal canal stenosis or neural foraminal narrowing. L3-L4: Disc desiccation and minimal disc bulge with left foraminal protrusion. Mild facet arthropathy. No spinal canal stenosis or neural foraminal narrowing. L4-L5: Disc desiccation and minimal disc bulge. Mild facet arthropathy. Epidural lipomatosis causes mild thecal sac narrowing at the disc level and moderate to severe thecal sac narrowing posterior to L5. No neural foraminal narrowing. L5-S1: No significant disc bulge. Mild facet arthropathy. Epidural lipomatosis, which causes severe thecal sac narrowing. No neural foraminal narrowing. IMPRESSION: 1. 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 No neural foraminal narrowing. 2. Diffusely decreased marrow signal, slightly hyperintense to skeletal muscle, likely red marrow reconversion. Additional speckled marrow signal in the bilateral iliac bones and, to a lesser extent,  vertebral bodies, is favored to be benign given its appearance on the 12/10/2021 CT. A diffuse infiltrative bone marrow process is felt to be less likely. Consider correlation with serum labs. Electronically Signed   By: Merilyn Baba M.D.   On: 12/11/2021 13:07   CT ABDOMEN PELVIS W CONTRAST  Result Date: 12/10/2021 CLINICAL DATA:  LEFT lower quadrant abdominal pain.  Sepsis EXAM: CT ABDOMEN AND PELVIS WITH CONTRAST TECHNIQUE: Multidetector CT imaging of the abdomen and pelvis was performed using the standard protocol following bolus administration of intravenous contrast. RADIATION DOSE REDUCTION: This exam was performed according to the departmental dose-optimization program which includes automated exposure control, adjustment of the mA and/or kV according to patient size and/or use of iterative reconstruction technique. CONTRAST:  34m OMNIPAQUE IOHEXOL 350 MG/ML SOLN COMPARISON:  None Available. FINDINGS: Lower chest: Lung bases are clear. Hepatobiliary: No focal hepatic lesion. No biliary duct dilatation. Common bile duct is normal. Pancreas: Pancreas is normal. No ductal dilatation. No pancreatic inflammation. Spleen: Normal spleen Adrenals/urinary tract: Adrenal glands normal. Bilateral simple fluid attenuation renal cysts. No follow-up recommended for Bosniak 1 renal cysts. Ureters and bladder normal. Stomach/Bowel: Stomach, small bowel, appendix, and cecum are normal. The colon and rectosigmoid colon are normal. Vascular/Lymphatic: Abdominal aorta is normal caliber. No periportal or retroperitoneal adenopathy. No pelvic adenopathy. Reproductive: Post prostatectomy Other: Prior LEFT inguinal hernia repair.  No evidence recurrence. Musculoskeletal: No aggressive osseous lesion. IMPRESSION: 1. No acute findings abdomen pelvis. 2. Prior LEFT inguinal hernia repair without complication. 3. Bilateral benign renal cysts.  No obstructive uropathy Electronically Signed   By: SSuzy BouchardM.D.   On:  12/10/2021 16:15   DG Chest 2 View  Result Date: 12/10/2021 CLINICAL DATA:  Chest pain. EXAM: CHEST - 2 VIEW COMPARISON:  Chest x-ray 09/07/2019. FINDINGS: The heart size and mediastinal contours are within normal limits. Both lungs are clear. No visible pleural effusions or pneumothorax. No acute osseous abnormality. IMPRESSION: No active cardiopulmonary disease. Electronically Signed   By: FMargaretha SheffieldM.D.   On: 12/10/2021 12:44    ASSESSMENT & PLAN:  ***  All of the patients questions were answered with apparent satisfaction. The  patient knows to call the clinic with any problems, questions or concerns.  I spent {CHL ONC TIME VISIT - OILNZ:[id number]} counseling the patient face to face. The total time spent in the appointment was {CHL ONC TIME VISIT - IFBPP:[id number]} and more than 50% was on counseling and direct patient cares.    Sullivan Lone MD [place] AAHIVMS [facility] Hematology/Oncology Physician [facility]  (Office):       [phone] (Work cell):  [phone] (Fax):           [phone]  01/02/2022 1:18 AM

## 2022-01-08 ENCOUNTER — Encounter: Payer: Self-pay | Admitting: Hematology

## 2022-01-09 ENCOUNTER — Ambulatory Visit: Payer: 59

## 2022-01-09 ENCOUNTER — Other Ambulatory Visit: Payer: 59

## 2022-01-09 ENCOUNTER — Other Ambulatory Visit: Payer: Self-pay | Admitting: *Deleted

## 2022-01-09 DIAGNOSIS — D469 Myelodysplastic syndrome, unspecified: Secondary | ICD-10-CM

## 2022-01-10 ENCOUNTER — Inpatient Hospital Stay: Payer: 59

## 2022-01-10 ENCOUNTER — Other Ambulatory Visit: Payer: Self-pay

## 2022-01-10 DIAGNOSIS — Z8546 Personal history of malignant neoplasm of prostate: Secondary | ICD-10-CM | POA: Diagnosis not present

## 2022-01-10 DIAGNOSIS — Z86711 Personal history of pulmonary embolism: Secondary | ICD-10-CM | POA: Diagnosis not present

## 2022-01-10 DIAGNOSIS — D61818 Other pancytopenia: Secondary | ICD-10-CM | POA: Diagnosis not present

## 2022-01-10 DIAGNOSIS — D469 Myelodysplastic syndrome, unspecified: Secondary | ICD-10-CM

## 2022-01-10 LAB — CBC WITH DIFFERENTIAL (CANCER CENTER ONLY)
Abs Immature Granulocytes: 0.01 10*3/uL (ref 0.00–0.07)
Basophils Absolute: 0 10*3/uL (ref 0.0–0.1)
Basophils Relative: 1 %
Eosinophils Absolute: 0 10*3/uL (ref 0.0–0.5)
Eosinophils Relative: 0 %
HCT: 27.8 % — ABNORMAL LOW (ref 39.0–52.0)
Hemoglobin: 8.9 g/dL — ABNORMAL LOW (ref 13.0–17.0)
Immature Granulocytes: 0 %
Lymphocytes Relative: 23 %
Lymphs Abs: 0.9 10*3/uL (ref 0.7–4.0)
MCH: 28.4 pg (ref 26.0–34.0)
MCHC: 32 g/dL (ref 30.0–36.0)
MCV: 88.8 fL (ref 80.0–100.0)
Monocytes Absolute: 0.8 10*3/uL (ref 0.1–1.0)
Monocytes Relative: 20 %
Neutro Abs: 2.2 10*3/uL (ref 1.7–7.7)
Neutrophils Relative %: 56 %
Platelet Count: 64 10*3/uL — ABNORMAL LOW (ref 150–400)
RBC: 3.13 MIL/uL — ABNORMAL LOW (ref 4.22–5.81)
RDW: 18.4 % — ABNORMAL HIGH (ref 11.5–15.5)
WBC Count: 4 10*3/uL (ref 4.0–10.5)
nRBC: 0.5 % — ABNORMAL HIGH (ref 0.0–0.2)

## 2022-01-10 LAB — CMP (CANCER CENTER ONLY)
ALT: 33 U/L (ref 0–44)
AST: 29 U/L (ref 15–41)
Albumin: 3.2 g/dL — ABNORMAL LOW (ref 3.5–5.0)
Alkaline Phosphatase: 149 U/L — ABNORMAL HIGH (ref 38–126)
Anion gap: 9 (ref 5–15)
BUN: 11 mg/dL (ref 8–23)
CO2: 26 mmol/L (ref 22–32)
Calcium: 8.5 mg/dL — ABNORMAL LOW (ref 8.9–10.3)
Chloride: 101 mmol/L (ref 98–111)
Creatinine: 0.86 mg/dL (ref 0.61–1.24)
GFR, Estimated: >60 mL/min (ref >60)
Glucose, Bld: 111 mg/dL — ABNORMAL HIGH (ref 70–99)
Potassium: 4 mmol/L (ref 3.5–5.1)
Sodium: 136 mmol/L (ref 135–145)
Total Bilirubin: 1.1 mg/dL (ref 0.3–1.2)
Total Protein: 6.3 g/dL — ABNORMAL LOW (ref 6.5–8.1)

## 2022-01-10 LAB — SAMPLE TO BLOOD BANK

## 2022-01-10 LAB — MAGNESIUM: Magnesium: 1.7 mg/dL (ref 1.7–2.4)

## 2022-01-13 ENCOUNTER — Other Ambulatory Visit (HOSPITAL_COMMUNITY): Payer: Self-pay

## 2022-01-15 ENCOUNTER — Other Ambulatory Visit (HOSPITAL_COMMUNITY): Payer: Self-pay

## 2022-01-16 ENCOUNTER — Other Ambulatory Visit (HOSPITAL_COMMUNITY): Payer: Self-pay

## 2022-01-18 ENCOUNTER — Other Ambulatory Visit (HOSPITAL_COMMUNITY): Payer: Self-pay

## 2022-01-19 IMAGING — NM NM BONE WHOLE BODY
2 series · 2 of 2 positions shown · non-contrast
Comparison: None.

CLINICAL DATA: Prostate cancer

EXAM:
NUCLEAR MEDICINE WHOLE BODY BONE SCAN
TECHNIQUE: Whole body anterior and posterior images were obtained approximately
3 hours after intravenous injection of radiopharmaceutical.
RADIOPHARMACEUTICALS:  8.5 mCi Vechnetium-YYm MDP IV

[Series 1: wbr_bone_40 whole body · 2.66mm/px · 1 of 1 slices shown (1 of 2)]
[im 1/1]
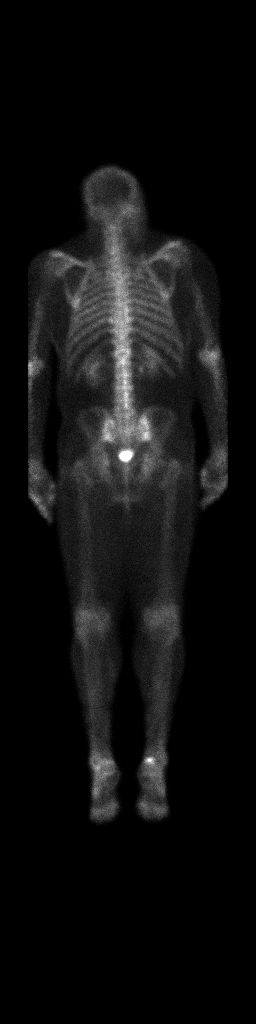

[Series 1: wbr_bone_40 whole body · 2.66mm/px · 1 of 1 slices shown (2 of 2)]
[im 1/1]
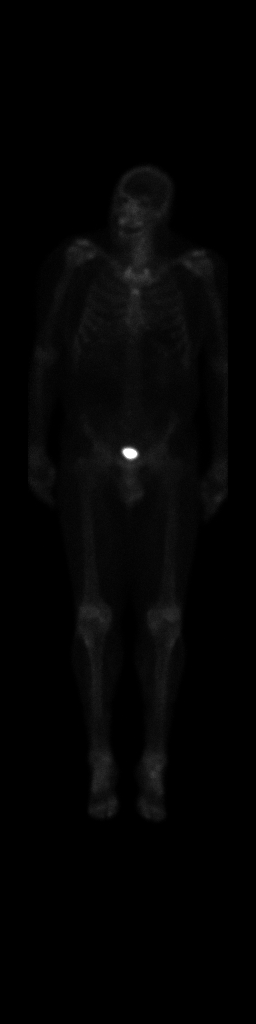

[2 of 2 positions shown; findings below may reference images not displayed]

FINDINGS: No abnormal radiotracer accumulation within the spine, pelvis or
ribs. No abnormal accumulation within the appendicular skeleton.
Degenerative uptake noted in the sternoclavicular joints. Focal
uptake at the sternomanubrial joint is favored degenerative or
posttraumatic.
IMPRESSION: No scintigraphic evidence skeletal metastasis.

Uptake at the sternomanubrial joint is favored degenerative or
posttraumatic

## 2022-01-21 ENCOUNTER — Encounter: Payer: Self-pay | Admitting: Hematology

## 2022-01-21 ENCOUNTER — Other Ambulatory Visit: Payer: Self-pay | Admitting: Hematology

## 2022-01-21 MED ORDER — VITAMIN D 25 MCG (1000 UNIT) PO TABS: 2000.0000 [IU] | ORAL_TABLET | Freq: Every day | ORAL | Status: DC

## 2022-01-21 MED ORDER — CLOTRIMAZOLE-BETAMETHASONE 1-0.05 % EX CREA: 1.0000 | TOPICAL_CREAM | Freq: Two times a day (BID) | CUTANEOUS | 0 refills | Status: DC

## 2022-01-24 ENCOUNTER — Other Ambulatory Visit: Payer: Self-pay | Admitting: *Deleted

## 2022-01-24 DIAGNOSIS — D469 Myelodysplastic syndrome, unspecified: Secondary | ICD-10-CM

## 2022-01-25 ENCOUNTER — Inpatient Hospital Stay: Payer: 59 | Attending: Hematology | Admitting: Hematology

## 2022-01-25 ENCOUNTER — Other Ambulatory Visit: Payer: Self-pay

## 2022-01-25 ENCOUNTER — Inpatient Hospital Stay: Payer: 59

## 2022-01-25 VITALS — BP 140/82 | HR 66 | Temp 98.1°F | Resp 18 | Ht 67.0 in | Wt 178.0 lb

## 2022-01-25 DIAGNOSIS — Z9079 Acquired absence of other genital organ(s): Secondary | ICD-10-CM | POA: Insufficient documentation

## 2022-01-25 DIAGNOSIS — I10 Essential (primary) hypertension: Secondary | ICD-10-CM | POA: Diagnosis not present

## 2022-01-25 DIAGNOSIS — D469 Myelodysplastic syndrome, unspecified: Secondary | ICD-10-CM | POA: Diagnosis not present

## 2022-01-25 DIAGNOSIS — Z8546 Personal history of malignant neoplasm of prostate: Secondary | ICD-10-CM | POA: Diagnosis not present

## 2022-01-25 DIAGNOSIS — Z79899 Other long term (current) drug therapy: Secondary | ICD-10-CM | POA: Insufficient documentation

## 2022-01-25 LAB — CMP (CANCER CENTER ONLY)
ALT: 54 U/L — ABNORMAL HIGH (ref 0–44)
AST: 36 U/L (ref 15–41)
Albumin: 3.6 g/dL (ref 3.5–5.0)
Alkaline Phosphatase: 139 U/L — ABNORMAL HIGH (ref 38–126)
Anion gap: 9 (ref 5–15)
BUN: 9 mg/dL (ref 8–23)
CO2: 27 mmol/L (ref 22–32)
Calcium: 9.3 mg/dL (ref 8.9–10.3)
Chloride: 104 mmol/L (ref 98–111)
Creatinine: 0.72 mg/dL (ref 0.61–1.24)
GFR, Estimated: >60 mL/min (ref >60)
Glucose, Bld: 118 mg/dL — ABNORMAL HIGH (ref 70–99)
Potassium: 3.7 mmol/L (ref 3.5–5.1)
Sodium: 140 mmol/L (ref 135–145)
Total Bilirubin: 1 mg/dL (ref 0.3–1.2)
Total Protein: 6.8 g/dL (ref 6.5–8.1)

## 2022-01-25 LAB — CBC WITH DIFFERENTIAL (CANCER CENTER ONLY)
Abs Immature Granulocytes: 0.01 10*3/uL (ref 0.00–0.07)
Basophils Absolute: 0 10*3/uL (ref 0.0–0.1)
Basophils Relative: 1 %
Eosinophils Absolute: 0 10*3/uL (ref 0.0–0.5)
Eosinophils Relative: 0 %
HCT: 32 % — ABNORMAL LOW (ref 39.0–52.0)
Hemoglobin: 10.1 g/dL — ABNORMAL LOW (ref 13.0–17.0)
Immature Granulocytes: 0 %
Lymphocytes Relative: 23 %
Lymphs Abs: 1.1 10*3/uL (ref 0.7–4.0)
MCH: 26.9 pg (ref 26.0–34.0)
MCHC: 31.6 g/dL (ref 30.0–36.0)
MCV: 85.1 fL (ref 80.0–100.0)
Monocytes Absolute: 1.1 10*3/uL — ABNORMAL HIGH (ref 0.1–1.0)
Monocytes Relative: 21 %
Neutro Abs: 2.8 10*3/uL (ref 1.7–7.7)
Neutrophils Relative %: 55 %
Platelet Count: 74 10*3/uL — ABNORMAL LOW (ref 150–400)
RBC: 3.76 MIL/uL — ABNORMAL LOW (ref 4.22–5.81)
RDW: 18.5 % — ABNORMAL HIGH (ref 11.5–15.5)
WBC Count: 5 10*3/uL (ref 4.0–10.5)
nRBC: 0 % (ref 0.0–0.2)

## 2022-01-25 LAB — MAGNESIUM: Magnesium: 1.8 mg/dL (ref 1.7–2.4)

## 2022-01-25 LAB — SAMPLE TO BLOOD BANK

## 2022-01-25 MED ORDER — DOXYCYCLINE HYCLATE 100 MG PO TABS: 100.0000 mg | ORAL_TABLET | Freq: Two times a day (BID) | ORAL | 0 refills | Status: AC

## 2022-01-25 NOTE — Progress Notes (Signed)
HEMATOLOGY ONCOLOGY PROGRESS NOTE  Date of service: 01/25/22   Patient Care Team: [name], FNP as PCP - General (Family Medicine)  Diagnosis: Low grade MDS  Current Treatment: Surveillance, B complex  SUMMARY OF ONCOLOGIC HISTORY: Oncology History   No history exists.    INTERVAL HISTORY: Tyler [name] is a 71 y.o. male with a past medical history of prostate cancer status post prostatectomy in 2021, history of bilateral pulmonary embolism after his surgery in 2021 presenting today for follow up and management of MDS.  He originally presented to the ED on 11/28/21 for low back pain with radiation down his leg. He was diagnosed with sciatica at urgent care prior to the ED visit. He had a negative xray and CT. He returned to the ED after developing a fever and hematuria. His urologist had stopped his Eliquis 3 months earlier. While in the ED on 12/10/21 he was noted to be febrile with blood work showing HGB of 6.2 and a UTI. His stool was heme-negative. He was hospitalized for treatment of his UTI with secondary sepsis.  Today, he states that he has been doing okay since our last visit. He feels that he is getting stronger.   However, he continues to have low back pain which is worsened with bending over and lying down on his back. His pain radiates into his lateral right leg. He has been walking for half an hour or so but notes that his pain worsens with walking in the cold. He has not been working since I last saw him. His back pain has improved somewhat since he stopped working. He is scheduled to see his PCP next week and plans to discuss referral for orthospine at that time.  He notes some dizziness when he takes some of his medications.  Since his last visit he has completely stopped drinking alcohol and use of Ibuprofen. His appetite has improved. His HGB has continued to improve to 10.1. His platelets have also improved to 74,000. His WBC counts continue to remain WNL. CMP  also improved.  He reports having some enlarged lymph nodes in the left anterior cervical region x1 month. He denies any dental problems. He denies any other lumps or bumps.  He reports a recent episode of chills. He denies any recent vaccines. He denies signs of infection such as sore throat, sinus drainage, cough, or urinary symptoms. He denies fevers. He denies nausea, vomiting, chest pain, dyspnea or cough. He denies any bowel changes. His weight has decreased 6 pounds over last 1.5 months .    REVIEW OF SYSTEMS:    10 Point review of systems of done and is negative except as noted above.  Past Medical History:  Diagnosis Date   [facility]cer [facility])    prostate   Hypertension    Past Surgical History:  Procedure Laterality Date   LYMPHADENECTOMY Bilateral 09/01/2019   Procedure: LYMPHADENECTOMY;  Surgeon: Alexis Frock, MD;  Location: [facility];  Service: Urology;  Laterality: Bilateral;   ROBOT ASSISTED LAPAROSCOPIC RADICAL PROSTATECTOMY N/A 09/01/2019   Procedure: XI ROBOTIC ASSISTED LAPAROSCOPIC RADICAL PROSTATECTOMY;  Surgeon: Alexis Frock, MD;  Location: [facility];  Service: Urology;  Laterality: N/A;  3 HRS   Social History   Tobacco Use   Smoking status: Never   Smokeless tobacco: Never  Vaping Use   Vaping Use: Never used  Substance Use Topics   Alcohol use: Yes    Comment: at least one 40 oz beer daily   Drug use:  Never    ALLERGIES:  is allergic to amlodipine.  MEDICATIONS:  Current Outpatient Medications  Medication Sig Dispense Refill   doxycycline (VIBRA-TABS) 100 MG tablet Take 1 tablet (100 mg total) by mouth 2 (two) times daily for 7 days. 14 tablet 0   B Complex Vitamins (VITAMIN B COMPLEX) TABS Take 1 tablet by mouth daily. 30 tablet 0   baclofen (LIORESAL) 10 MG tablet Take 10 mg by mouth 3 (three) times daily.     cholecalciferol (VITAMIN D3) 25 MCG (1000 UNIT) tablet Take 2 tablets (2,000 Units total) by mouth daily.     clotrimazole-betamethasone  (LOTRISONE) cream Apply 1 Application topically 2 (two) times daily. 45 g 0   folic acid (FOLVITE) 1 MG tablet Take 1 tablet (1 mg total) by mouth daily. 30 tablet 0   lisinopril (ZESTRIL) 5 MG tablet Take 5 mg by mouth daily.     Menthol-Methyl Salicylate (MUSCLE RUB) 10-15 % CREA Apply 1 Application topically daily as needed for muscle pain.     metoprolol tartrate (LOPRESSOR) 25 MG tablet Take 0.5 tablets (12.5 mg total) by mouth 2 (two) times daily. 60 tablet 0   Multiple Vitamin (MULTIVITAMIN WITH MINERALS) TABS tablet Take 1 tablet by mouth daily.     ondansetron (ZOFRAN-ODT) 4 MG disintegrating tablet '4mg'$  ODT q4 hours prn nausea/vomit 20 tablet 0   pantoprazole (PROTONIX) 40 MG tablet Take 1 tablet (40 mg total) by mouth daily before breakfast. 30 tablet 0   polyethylene glycol powder (GLYCOLAX/MIRALAX) 17 GM/SCOOP powder Take 17 g by mouth daily as needed for moderate constipation. 238 g 0   senna-docusate (SENOKOT-S) 8.6-50 MG tablet Take 2 tablets by mouth at bedtime as needed for mild constipation. 60 tablet 0   thiamine (VITAMIN B1) 100 MG tablet Take 1 tablet (100 mg total) by mouth daily. 30 tablet 0   No current facility-administered medications for this visit.    PHYSICAL EXAMINATION: ECOG PERFORMANCE STATUS: {CHL ONC ECOG GQ:[id number]}  . Vitals:   01/25/22 0933  BP: (!) 140/82  Pulse: 66  Resp: 18  Temp: 98.1 F (36.7 C)  SpO2: 98%    Filed Weights   01/25/22 0933  Weight: 178 lb (80.7 kg)   .Body mass index is 27.88 kg/m.  GENERAL:alert, in no acute distress and comfortable SKIN: no acute rashes, no significant lesions EYES: conjunctiva are pink and non-injected, sclera anicteric OROPHARYNX: MMM, no exudates, no oropharyngeal erythema or ulceration. Right lower dental cavity. *** NECK: supple, no JVD LYMPH:  palpable lymphadenopathy in the right anterior cervical region. *** No axillary or inguinal lymphadenopathy LUNGS: clear to auscultation b/l with  normal respiratory effort HEART: regular rate & rhythm ABDOMEN:  normoactive bowel sounds , non tender, not distended. Extremity: no pedal edema PSYCH: alert & oriented x 3 with fluent speech NEURO: no focal motor/sensory deficits  LABORATORY DATA:   I have reviewed the data as listed     Latest Ref Rng & Units 01/25/2022    9:03 AM 01/10/2022    1:14 PM 12/26/2021    9:24 AM  CBC  WBC 4.0 - 10.5 K/uL 5.0  4.0  2.6   Hemoglobin 13.0 - 17.0 g/dL 10.1  8.9  8.9   Hematocrit 39.0 - 52.0 % 32.0  27.8  27.6   Platelets 150 - 400 K/uL 74  64  55       Latest Ref Rng & Units 01/25/2022    9:03 AM 01/10/2022    1:14 PM  12/26/2021    9:24 AM  CMP  Glucose 70 - 99 mg/dL 118  111  122   BUN 8 - 23 mg/dL '9  11  14   '$ Creatinine 0.61 - 1.24 mg/dL 0.72  0.86  0.86   Sodium 135 - 145 mmol/L 140  136  138   Potassium 3.5 - 5.1 mmol/L 3.7  4.0  3.6   Chloride 98 - 111 mmol/L 104  101  102   CO2 22 - 32 mmol/L '27  26  28   '$ Calcium 8.9 - 10.3 mg/dL 9.3  8.5  8.2   Total Protein 6.5 - 8.1 g/dL 6.8  6.3  6.1   Total Bilirubin 0.3 - 1.2 mg/dL 1.0  1.1  1.3   Alkaline Phos 38 - 126 U/L 139  149  172   AST 15 - 41 U/L 36  29  36   ALT 0 - 44 U/L 54  33  47     RADIOGRAPHIC STUDIES: I have personally reviewed the radiological images as listed and agreed with the findings in the report. No results found.  ASSESSMENT & PLAN:  Low grade MDS   PLAN: -Reviewed lab results thoroughly with patient and family. -HGB has continued to improve to 10.1, Platelets have also improved to 74,000, and WBC counts continue to remain WNL. CMP also improved. -Will repeat labs at next visit. -Discussed diagnosis and prognosis of his low-grade MDS. -He is agreeable to advanced genetic testing today to further characterize MDS. -Encouraged continued balanced diet and hydration. -Advised to continue B complex and Vitamin D. -Again strongly advised pt not to take NSAIDs. He may use Tylenol prn for his back  pain. -Continue with PCP follow up for back pain management. He would also like to discuss orthospine with PCP next week. -Encouraged him to stay UTD with his vaccines including flu and COVID boosters. -Advised him to follow up with a dentist regarding his cavity with secondary cervical lymphadenopathy. He declined offer for referral to dentistry as he already has established dental care.   FOLLOW UP: [facility] today RTC with Dr Irene Limbo with labs in 6 weeks   The total time spent in the appointment was *** minutes* .  All of the patient's questions were answered with apparent satisfaction. The patient knows to call the clinic with any problems, questions or concerns.   I,Alexis Herring,acting as a Education administrator for Sullivan Lone, MD.,have documented all relevant documentation on the behalf of Sullivan Lone, MD,as directed by  Sullivan Lone, MD while in the presence of Sullivan Lone, MD.  ***  Sullivan Lone MD [place] AAHIVMS [facility] Hematology/Oncology Physician [facility]  .*Total Encounter Time as defined by the Centers for Medicare and Medicaid Services includes, in addition to the face-to-face time of a patient visit (documented in the note above) non-face-to-face time: obtaining and reviewing outside history, ordering and reviewing medications, tests or procedures, care coordination (communications with other health care professionals or caregivers) and documentation in the medical record.

## 2022-01-25 NOTE — Progress Notes (Signed)
This encounter was created in error - please disregard.

## 2022-01-28 DIAGNOSIS — M5440 Lumbago with sciatica, unspecified side: Secondary | ICD-10-CM | POA: Diagnosis not present

## 2022-01-28 DIAGNOSIS — R600 Localized edema: Secondary | ICD-10-CM | POA: Diagnosis not present

## 2022-01-28 DIAGNOSIS — I10 Essential (primary) hypertension: Secondary | ICD-10-CM | POA: Diagnosis not present

## 2022-01-28 DIAGNOSIS — Z789 Other specified health status: Secondary | ICD-10-CM | POA: Diagnosis not present

## 2022-02-06 ENCOUNTER — Ambulatory Visit: Payer: 59

## 2022-02-06 ENCOUNTER — Other Ambulatory Visit: Payer: 59

## 2022-02-07 ENCOUNTER — Other Ambulatory Visit: Payer: Self-pay

## 2022-02-07 DIAGNOSIS — D469 Myelodysplastic syndrome, unspecified: Secondary | ICD-10-CM

## 2022-02-08 ENCOUNTER — Inpatient Hospital Stay: Payer: 59

## 2022-02-08 ENCOUNTER — Other Ambulatory Visit: Payer: Self-pay

## 2022-02-08 DIAGNOSIS — Z79899 Other long term (current) drug therapy: Secondary | ICD-10-CM | POA: Diagnosis not present

## 2022-02-08 DIAGNOSIS — Z9079 Acquired absence of other genital organ(s): Secondary | ICD-10-CM | POA: Diagnosis not present

## 2022-02-08 DIAGNOSIS — D469 Myelodysplastic syndrome, unspecified: Secondary | ICD-10-CM | POA: Diagnosis not present

## 2022-02-08 DIAGNOSIS — I10 Essential (primary) hypertension: Secondary | ICD-10-CM | POA: Diagnosis not present

## 2022-02-08 DIAGNOSIS — Z8546 Personal history of malignant neoplasm of prostate: Secondary | ICD-10-CM | POA: Diagnosis not present

## 2022-02-08 LAB — CBC WITH DIFFERENTIAL (CANCER CENTER ONLY)
Abs Immature Granulocytes: 0.01 10*3/uL (ref 0.00–0.07)
Basophils Absolute: 0 10*3/uL (ref 0.0–0.1)
Basophils Relative: 1 %
Eosinophils Absolute: 0 10*3/uL (ref 0.0–0.5)
Eosinophils Relative: 1 %
HCT: 36 % — ABNORMAL LOW (ref 39.0–52.0)
Hemoglobin: 11.3 g/dL — ABNORMAL LOW (ref 13.0–17.0)
Immature Granulocytes: 0 %
Lymphocytes Relative: 35 %
Lymphs Abs: 1.5 10*3/uL (ref 0.7–4.0)
MCH: 27.4 pg (ref 26.0–34.0)
MCHC: 31.4 g/dL (ref 30.0–36.0)
MCV: 87.4 fL (ref 80.0–100.0)
Monocytes Absolute: 0.8 10*3/uL (ref 0.1–1.0)
Monocytes Relative: 19 %
Neutro Abs: 2 10*3/uL (ref 1.7–7.7)
Neutrophils Relative %: 44 %
Platelet Count: 212 10*3/uL (ref 150–400)
RBC: 4.12 MIL/uL — ABNORMAL LOW (ref 4.22–5.81)
RDW: 18.9 % — ABNORMAL HIGH (ref 11.5–15.5)
Smear Review: NORMAL
WBC Count: 4.4 10*3/uL (ref 4.0–10.5)
nRBC: 0 % (ref 0.0–0.2)

## 2022-02-08 LAB — CMP (CANCER CENTER ONLY)
ALT: 53 U/L — ABNORMAL HIGH (ref 0–44)
AST: 47 U/L — ABNORMAL HIGH (ref 15–41)
Albumin: 3.7 g/dL (ref 3.5–5.0)
Alkaline Phosphatase: 142 U/L — ABNORMAL HIGH (ref 38–126)
Anion gap: 7 (ref 5–15)
BUN: 7 mg/dL — ABNORMAL LOW (ref 8–23)
CO2: 27 mmol/L (ref 22–32)
Calcium: 9.1 mg/dL (ref 8.9–10.3)
Chloride: 108 mmol/L (ref 98–111)
Creatinine: 0.69 mg/dL (ref 0.61–1.24)
GFR, Estimated: >60 mL/min (ref >60)
Glucose, Bld: 84 mg/dL (ref 70–99)
Potassium: 3.8 mmol/L (ref 3.5–5.1)
Sodium: 142 mmol/L (ref 135–145)
Total Bilirubin: 0.5 mg/dL (ref 0.3–1.2)
Total Protein: 6.2 g/dL — ABNORMAL LOW (ref 6.5–8.1)

## 2022-02-08 LAB — MAGNESIUM: Magnesium: 1.7 mg/dL (ref 1.7–2.4)

## 2022-02-08 LAB — SAMPLE TO BLOOD BANK

## 2022-02-20 DIAGNOSIS — R8271 Bacteriuria: Secondary | ICD-10-CM | POA: Diagnosis not present

## 2022-02-25 DIAGNOSIS — C8290 Follicular lymphoma, unspecified, unspecified site: Secondary | ICD-10-CM

## 2022-02-25 HISTORY — DX: Follicular lymphoma, unspecified, unspecified site: C82.90

## 2022-03-01 ENCOUNTER — Ambulatory Visit (INDEPENDENT_AMBULATORY_CARE_PROVIDER_SITE_OTHER): Payer: 59 | Admitting: Sports Medicine

## 2022-03-01 VITALS — BP 138/90 | Ht 67.0 in | Wt 178.0 lb

## 2022-03-01 DIAGNOSIS — M25551 Pain in right hip: Secondary | ICD-10-CM | POA: Diagnosis not present

## 2022-03-01 MED ORDER — METHYLPREDNISOLONE ACETATE 40 MG/ML IJ SUSP
40.0000 mg | Freq: Once | INTRAMUSCULAR | Status: AC
  Administered 2022-03-01: 40 mg via INTRA_ARTICULAR

## 2022-03-01 NOTE — Progress Notes (Signed)
Subjective:   HPI: Patient is a 72 y.o. male here for right hip and leg pain. History is significant for MDS and prior prostatic cancer s/p prostatectomy in 2021, prior b/l PE in 2021 after surgery.  Patient noted to have severe lower back pain with radiation into his right lower extremity for several years that has been increasingly worsening. While he was hospitalized in October 2023 he had an MRI which noted lumbosacral stenosis and chronic changes but was not a surgical candidate at that time secondary to his MDS.  He was referred to PT but has not yet proceeded with rehabilitation.   Today patient notes that he does have pain that will radiate from his back to his toes but that his main concern is actually located on the outside of his hip and radiates down towards his knee. This pain limits his functionality at home and seems very constant. He reports no acute injury and he is using OTC medications as needed for the pain (reports he was instructed to avoid Ibuprofen due his MDS).    PMH, medications, surgical history, social history, family history and allergies reviewed.      Objective:  Physical Exam: BP (!) 138/90   Ht '5\' 7"'$  (1.702 m)   Wt 178 lb (80.7 kg)   BMI 27.88 kg/m  Gen: awake, alert, NAD, comfortable in exam room Pulm: breathing unlabored  Lumbar spine: - Inspection: no gross deformity or asymmetry, swelling or ecchymosis - Palpation: TTP over the bilateral SI joints (R>L) No TTP over the spinous processes - ROM: full active ROM of the lumbar spine in flexion and extension without pain - Strength: Decreased strength 4/5 of the right lower extremity, mild limp present in gait. Left leg with normal strength  - Neuro: sensation intact in the L4-S1 nerve root distribution b/l, 2+ L4 and S1 reflexes - Special testing: Positive straight leg raise on the right, negative straight leg on the left    Right Hip:  - Inspection: No gross deformity, no swelling, erythema, or  ecchymosis - Palpation: TTP over the greater trochanter and distally along IT band. Non-tender in the groin region - ROM: decreased active and passive ROM of the right hip in all directions secondary to pain   Normal range of motion on Flexion, extension, abduction, internal and external rotation - Strength: Decreased strength of 4/5 with strength easily broken with resistance of abduction - Neuro/vasc: NV intact distally - Special Tests: Positive FABER and FADIR.   Left hip evaluation with no gross deformity, non-TTP with normal ROM and negative FABER and FADIR.     Assessment & Plan:  1. Greater Trochanter Pain Syndrome, Right Physical exam most consistent with greater trochanter pain mixed with ITB syndrome. Less suspicion for hip OA contributing to this current pain based on exam and history. Patient currently avoid anti-inflammatories due to hematologic dysfunction. Discussed options for treatment and patient agreeable to trochanteric injection, which was performed by Dr. Nilda Calamity. - Greater trochanter injection today - Discuss anti-inflammatory medications with hematologist for recommendations - Physical therapy recommended - Follow-up in 4-6 weeks  2. Lumbosacral stenosis MRI 10/17 noted epidural lipomatosis with varying levels of thecal sac narrowing in the lumbar spine from L4-S1. Patient did not pursue physical therapy and were unaware they were supposed to follow-up with rehab outpatient. Seen by neurosurgery during October hospitalization but was not a surgical candidate. - Physical therapy - Follow-up 4-6 weeks - Consideration for PMR referral for spinal injections in the future  Fellow Attestation: I personally evaluated the patient with the resident and agree with the above documentation with the following emphasis: History and exam consistent with greater trochanteric pain syndrome.  Currently unable to take NSAIDs.  Has not tried any physical therapy.  Will give him greater  trochanter injection today to help alleviate his pain and allow him to work on hip abductor strengthening with physical therapy.  He tolerated the procedure well.  Follow-up for 6 weeks.  Procedure performed:  Right Greater Trochanter Bursa Corticosteroid Injection; palpation guided  Consent obtained and verified. Time-out conducted. Noted no overlying erythema, induration, or other signs of local infection. The Right greater trochanter was palpated and marked. The overlying skin was prepped in a sterile fashion. Topical analgesic spray: Ethyl chloride. Needle: 22 gauge, 1.5 inch Meds: 40 mg methylrprednisolone, 3 ml 1% lidocaine without epinephrine Completed without difficulty  Advised to call if fevers/chills, erythema, induration, drainage, or persistent bleeding.  Dorian Pod Rafoth 03/01/2022   I was the preceptor for this visit and available for immediate consultation Shellia Cleverly, DO

## 2022-03-04 ENCOUNTER — Other Ambulatory Visit (HOSPITAL_COMMUNITY): Payer: Self-pay

## 2022-03-06 IMAGING — CT CT ABD-PELV W/ CM
2 of 5 series · 15 of 46 positions shown, 17 images · IV contrast (Omni 300)
Comparison: CT abdomen pelvis dated 07/23/2019.

CLINICAL DATA: 69-year-old male with abdominal distension. Recent
prostatectomy on 09/01/2019. Now with acute onset severe abdominal
and flank pain.

EXAM:
CT ABDOMEN AND PELVIS WITH CONTRAST
TECHNIQUE: Multidetector CT imaging of the abdomen and pelvis was performed
using the standard protocol following bolus administration of
intravenous contrast.
CONTRAST:  100mL OMNIPAQUE IOHEXOL 300 MG/ML  SOLN

[Series 3: a/p w/ 5mm · axial · 0.81mm/px · z∈[-470,+4]mm · 12 of 105 slices shown, 14 images]
[im 5/105  soft-tissue]
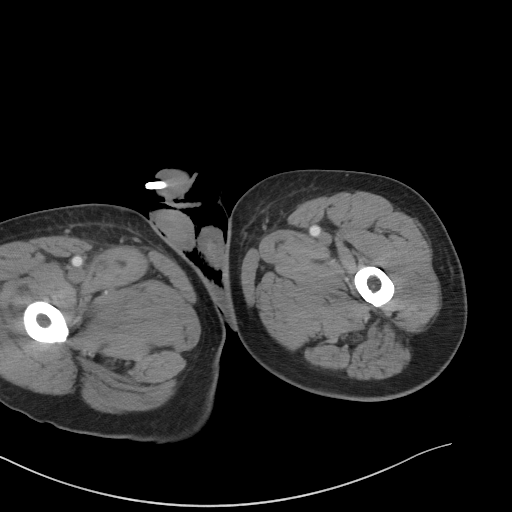
[im 5/105  bone]
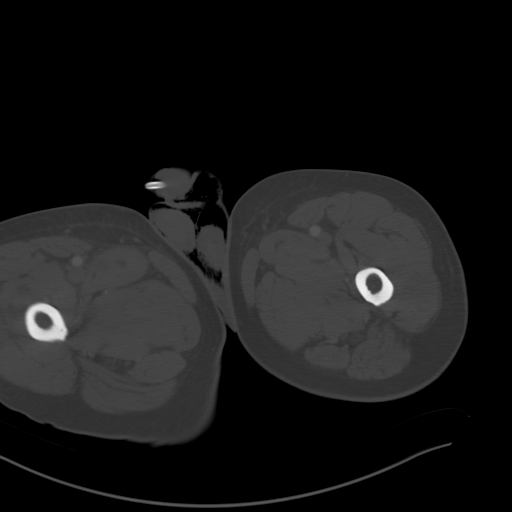
[im 15/105  soft-tissue]
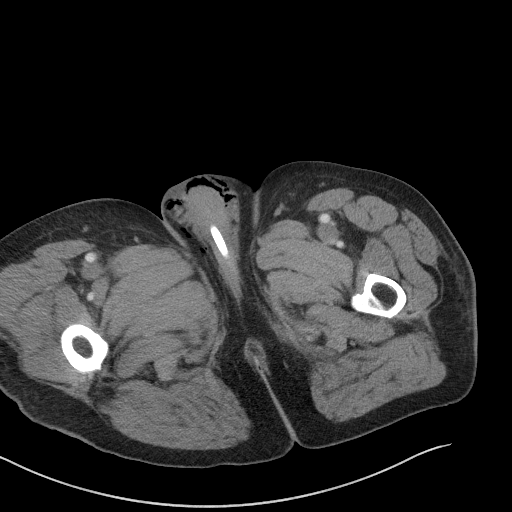
[im 25/105  soft-tissue]
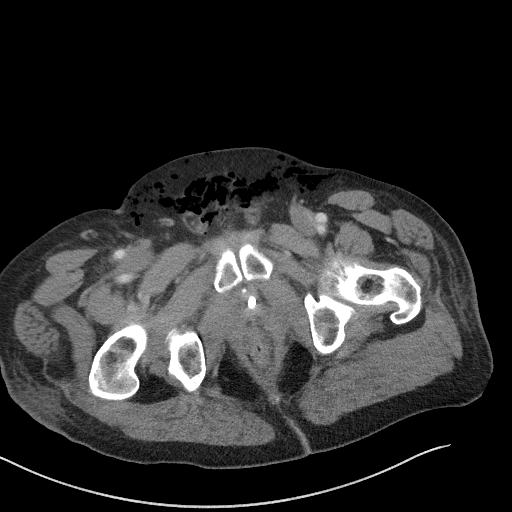
[im 30/105  soft-tissue]
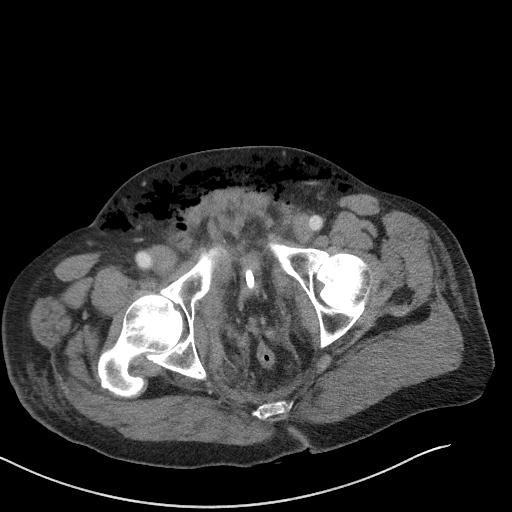
[im 40/105  soft-tissue]
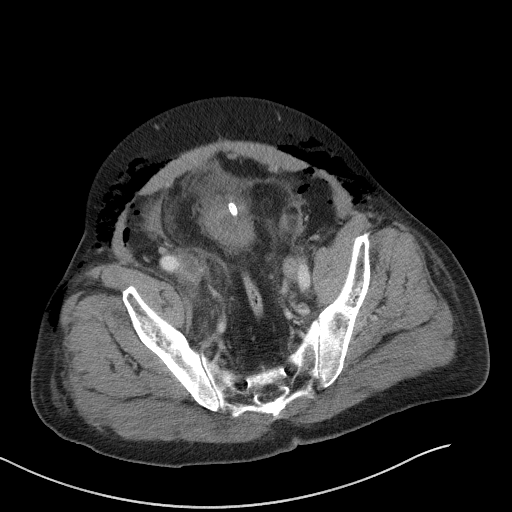
[im 50/105  soft-tissue]
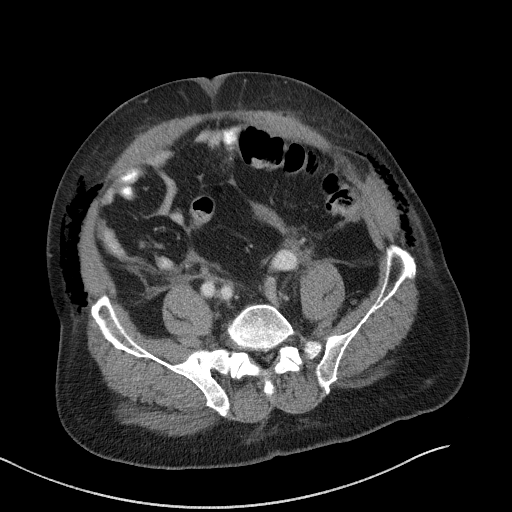
[im 55/105  soft-tissue]
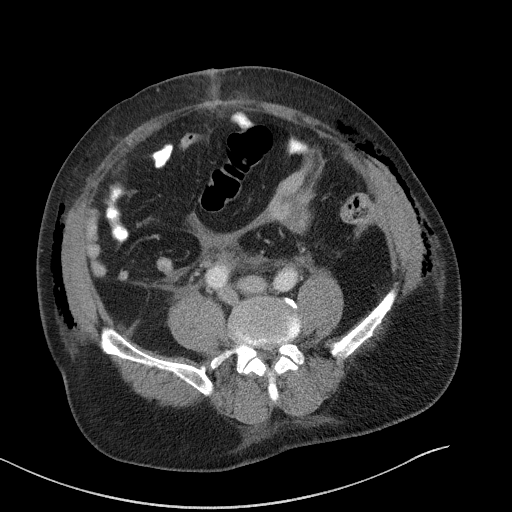
[im 65/105  soft-tissue]
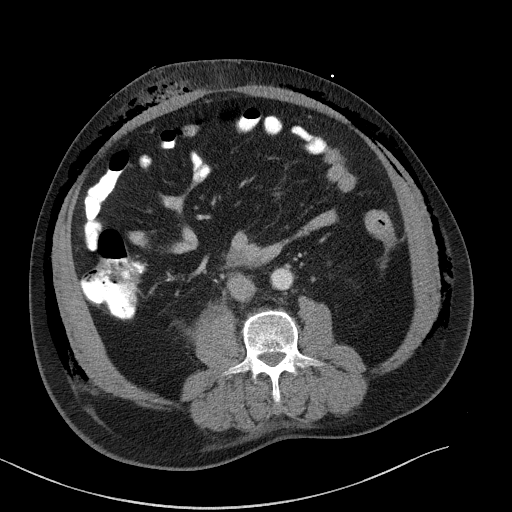
[im 75/105  soft-tissue]
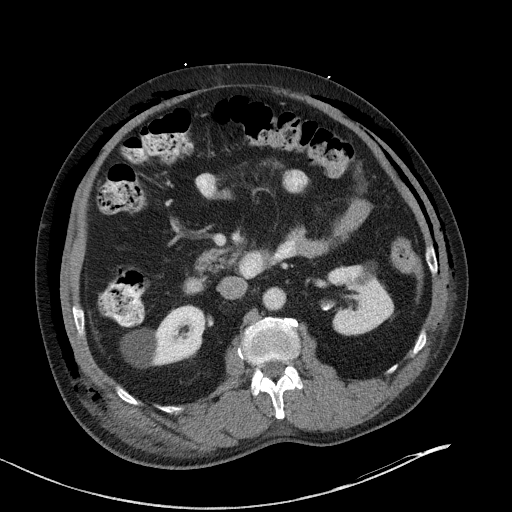
[im 75/105  bone]
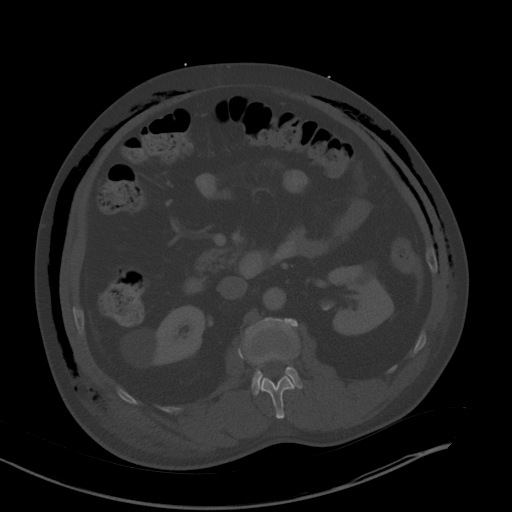
[im 80/105  soft-tissue]
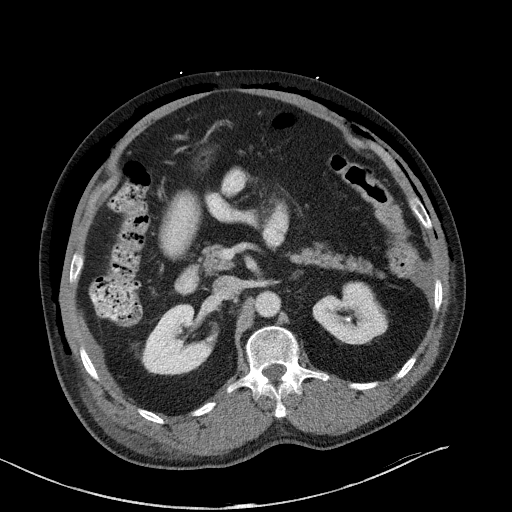
[im 90/105  soft-tissue]
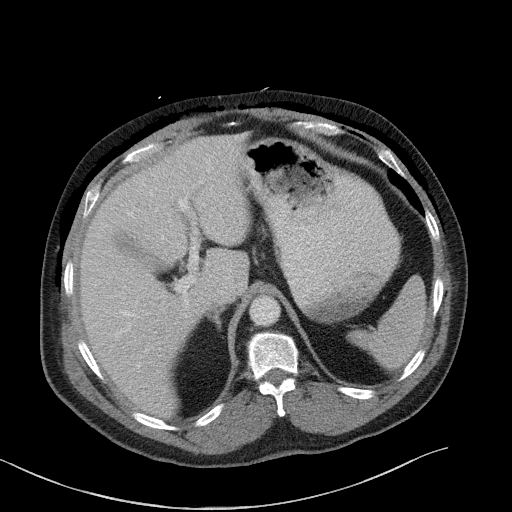
[im 100/105  soft-tissue]
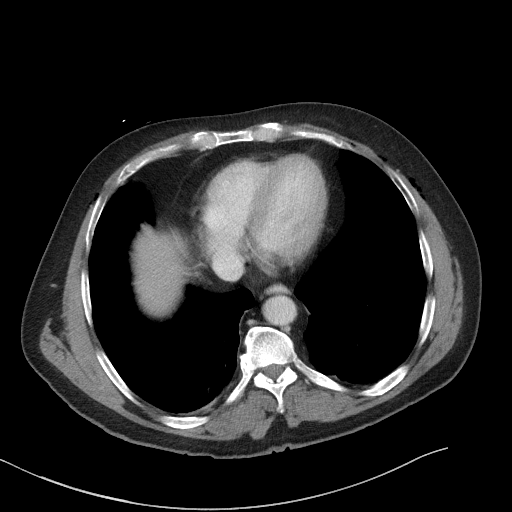

[Series 6: a/p w/ cor · coronal · 0.75mm/px · 3 of 165 slices shown]
[im 55/165  soft-tissue]
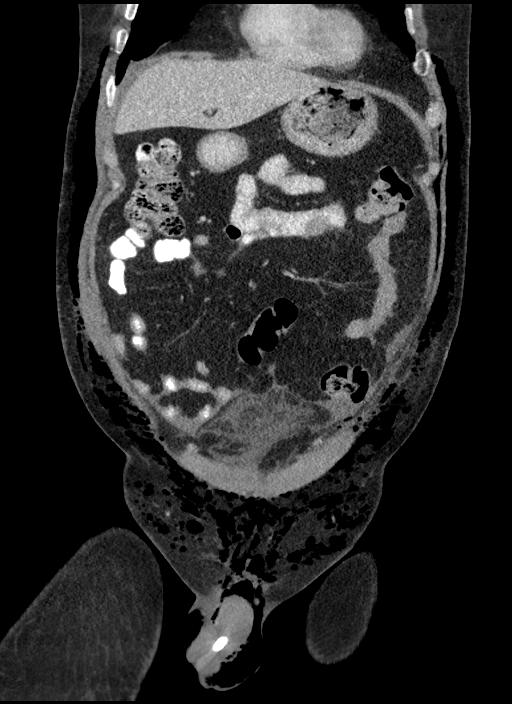
[im 73/165  soft-tissue]
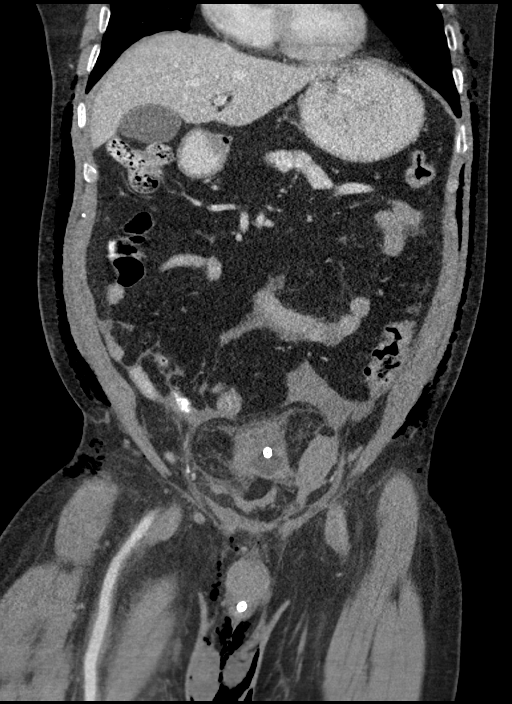
[im 92/165  soft-tissue]
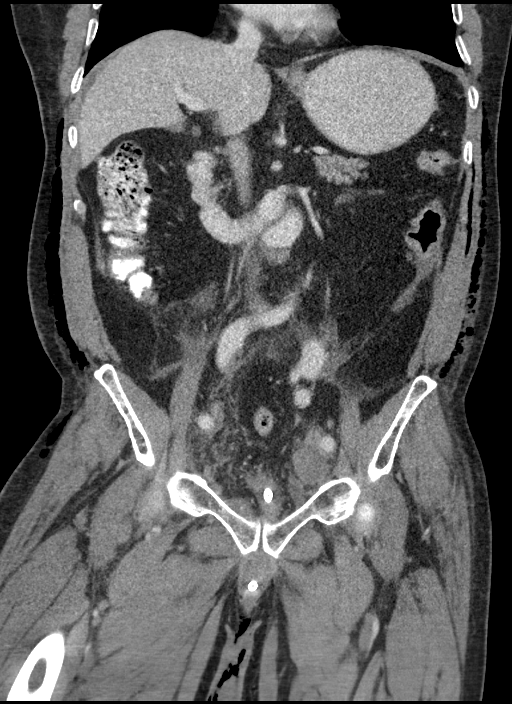

[15 of 46 positions shown; findings below may reference images not displayed]

FINDINGS: Lower chest: Minimal bibasilar atelectasis. The visualized lung
bases are otherwise clear.

Several small pockets of air along the anterior pelvic peritoneum,
likely related to recent surgery. Diffuse pelvic stranding and small
amount of free fluid within the pelvis as well as small perihepatic
ascites. There is small amount of hematoma in the anterior pelvis
measuring approximately 3 x 5 cm (70/3).

Hepatobiliary: Several subcentimeter hepatic hypodense foci are too
small to characterize. The liver is otherwise unremarkable. No
intrahepatic biliary ductal dilatation. The gallbladder is
unremarkable.

Pancreas: Unremarkable. No pancreatic ductal dilatation or
surrounding inflammatory changes.

Spleen: Normal in size without focal abnormality.

Adrenals/Urinary Tract: The adrenal glands are unremarkable. There
is no hydronephrosis on either side. There is symmetric enhancement
and excretion of contrast by both kidneys. Several small bilateral
renal cysts. The visualized ureters appear unremarkable. The urinary
bladder is decompressed around a Foley catheter.

Stomach/Bowel: There is no bowel obstruction or active inflammation.
The appendix is normal.

Vascular/Lymphatic: The abdominal aorta and IVC are unremarkable. No
portal venous gas. Several ovoid structures along the pelvic
sidewalls and along the course of the external iliac vessels noted
measuring up to 2.7 x 2.7 cm on the left (70/3). These appear to
have layering high attenuating content or contrast material and
suspicious for a small hematomas although vascular injuries or
pseudoaneurysm are not excluded. Follow-up recommended. There is no
adenopathy.

Reproductive: Prostatectomy. The seminal vesicles are unremarkable.

Other: There is extensive subcutaneous emphysema extending from the
scrotal wall to the anterior pelvic wall and anterior abdominal
wall.

Musculoskeletal: No acute or significant osseous findings.
IMPRESSION: 1. Status post prostatectomy. Small amount of hematoma in the
anterior pelvis as well as extensive subcutaneous emphysema.
2. Several ovoid structures along the pelvic sidewalls and along the
course of the external iliac vessels suspicious for a small
hematomas versus vascular injuries or pseudoaneurysm. Follow-up
recommended.
3. No bowel obstruction. Normal appendix.

## 2022-03-08 ENCOUNTER — Other Ambulatory Visit: Payer: Self-pay

## 2022-03-08 DIAGNOSIS — D469 Myelodysplastic syndrome, unspecified: Secondary | ICD-10-CM

## 2022-03-11 ENCOUNTER — Inpatient Hospital Stay: Payer: 59 | Attending: Hematology

## 2022-03-11 ENCOUNTER — Other Ambulatory Visit: Payer: Self-pay

## 2022-03-11 ENCOUNTER — Inpatient Hospital Stay: Payer: 59 | Admitting: Hematology

## 2022-03-11 VITALS — BP 142/83 | HR 69 | Temp 97.7°F | Resp 18 | Ht 67.0 in | Wt 194.8 lb

## 2022-03-11 DIAGNOSIS — D469 Myelodysplastic syndrome, unspecified: Secondary | ICD-10-CM

## 2022-03-11 DIAGNOSIS — Z86711 Personal history of pulmonary embolism: Secondary | ICD-10-CM | POA: Diagnosis not present

## 2022-03-11 DIAGNOSIS — Z8546 Personal history of malignant neoplasm of prostate: Secondary | ICD-10-CM | POA: Insufficient documentation

## 2022-03-11 DIAGNOSIS — Z79899 Other long term (current) drug therapy: Secondary | ICD-10-CM | POA: Diagnosis not present

## 2022-03-11 DIAGNOSIS — I10 Essential (primary) hypertension: Secondary | ICD-10-CM | POA: Diagnosis not present

## 2022-03-11 DIAGNOSIS — Z87440 Personal history of urinary (tract) infections: Secondary | ICD-10-CM | POA: Diagnosis not present

## 2022-03-11 LAB — CMP (CANCER CENTER ONLY)
ALT: 17 U/L (ref 0–44)
AST: 19 U/L (ref 15–41)
Albumin: 4.2 g/dL (ref 3.5–5.0)
Alkaline Phosphatase: 111 U/L (ref 38–126)
Anion gap: 7 (ref 5–15)
BUN: 14 mg/dL (ref 8–23)
CO2: 29 mmol/L (ref 22–32)
Calcium: 9.3 mg/dL (ref 8.9–10.3)
Chloride: 103 mmol/L (ref 98–111)
Creatinine: 0.73 mg/dL (ref 0.61–1.24)
GFR, Estimated: >60 mL/min (ref >60)
Glucose, Bld: 94 mg/dL (ref 70–99)
Potassium: 3.8 mmol/L (ref 3.5–5.1)
Sodium: 139 mmol/L (ref 135–145)
Total Bilirubin: 0.4 mg/dL (ref 0.3–1.2)
Total Protein: 7.1 g/dL (ref 6.5–8.1)

## 2022-03-11 LAB — CBC WITH DIFFERENTIAL (CANCER CENTER ONLY)
Abs Immature Granulocytes: 0.01 10*3/uL (ref 0.00–0.07)
Basophils Absolute: 0 10*3/uL (ref 0.0–0.1)
Basophils Relative: 1 %
Eosinophils Absolute: 0 10*3/uL (ref 0.0–0.5)
Eosinophils Relative: 0 %
HCT: 44 % (ref 39.0–52.0)
Hemoglobin: 14.8 g/dL (ref 13.0–17.0)
Immature Granulocytes: 0 %
Lymphocytes Relative: 26 %
Lymphs Abs: 1.5 10*3/uL (ref 0.7–4.0)
MCH: 28.7 pg (ref 26.0–34.0)
MCHC: 33.6 g/dL (ref 30.0–36.0)
MCV: 85.3 fL (ref 80.0–100.0)
Monocytes Absolute: 0.7 10*3/uL (ref 0.1–1.0)
Monocytes Relative: 11 %
Neutro Abs: 3.6 10*3/uL (ref 1.7–7.7)
Neutrophils Relative %: 62 %
Platelet Count: 204 10*3/uL (ref 150–400)
RBC: 5.16 MIL/uL (ref 4.22–5.81)
RDW: 16.3 % — ABNORMAL HIGH (ref 11.5–15.5)
WBC Count: 5.8 10*3/uL (ref 4.0–10.5)
nRBC: 0 % (ref 0.0–0.2)

## 2022-03-11 LAB — MAGNESIUM: Magnesium: 1.8 mg/dL (ref 1.7–2.4)

## 2022-03-11 LAB — SAMPLE TO BLOOD BANK

## 2022-03-11 NOTE — Progress Notes (Signed)
HEMATOLOGY ONCOLOGY CLINIC NOTE  Date of service: 03/11/22   Patient Care Team: [name], FNP as PCP - General (Family Medicine)  Diagnosis: Low grade MDS  Current Treatment: Surveillance, B complex  SUMMARY OF ONCOLOGIC HISTORY: Oncology History   No history exists.    HISTORY OF PRESENTING ILLNESS: Tyler [name] is a 72 y.o. male with a past medical history of prostate cancer status post prostatectomy in 2021, history of bilateral pulmonary embolism after his surgery in 2021 presenting today for follow up and management of MDS.  He originally presented to the ED on 11/28/21 for low back pain with radiation down his leg. He was diagnosed with sciatica at urgent care prior to the ED visit. He had a negative xray and CT. He returned to the ED after developing a fever and hematuria. His urologist had stopped his Eliquis 3 months earlier. While in the ED on 12/10/21 he was noted to be febrile with blood work showing HGB of 6.2 and a UTI. His stool was heme-negative. He was hospitalized for treatment of his UTI with secondary sepsis.  Today, he states that he has been doing okay since our last visit. He feels that he is getting stronger.   However, he continues to have low back pain which is worsened with bending over and lying down on his back. His pain radiates into his lateral right leg. He has been walking for half an hour or so but notes that his pain worsens with walking in the cold. He has not been working since I last saw him. His back pain has improved somewhat since he stopped working. He is scheduled to see his PCP next week and plans to discuss referral for orthospine at that time.  He notes some dizziness when he takes some of his medications.  Since his last visit he has completely stopped drinking alcohol and use of Ibuprofen. His appetite has improved. His HGB has continued to improve to 10.1. His platelets have also improved to 74,000. His WBC counts continue to  remain WNL. CMP also improved.  He reports having some enlarged lymph nodes in the left anterior cervical region x1 month. He denies any dental problems. He denies any other lumps or bumps.  He reports a recent episode of chills. He denies any recent vaccines. He denies signs of infection such as sore throat, sinus drainage, cough, or urinary symptoms. He denies fevers. He denies nausea, vomiting, chest pain, dyspnea or cough. He denies any bowel changes. His weight has decreased 6 pounds over last 1.5 months .   INTERVAL HISTORY: Tyler [name] is a 72 y.o. male here for continued evaluation and management of his low grade MDS.   Patient was last seen by me on 01/25/2022 and complained of back pain radiating to his lateral right leg, enlarged lymph nodes in the left anterior cervical region, and mild dizziness with his medications.   Patient is accompanied by his daughter during this visit and reports he has been doing well without any new medical concerns since our last visit.   His daughter notes that he has not been drinking alcohol anymore and has been having healthier diet.   Patient has had an steroid injection in his back. He does reports occasional mild back pain.  He denies fever, chills, night sweats, abdominal pain, fatigue, unexpected weight loss, new infection, new lumps/bumps, abnormal bowel movement, or leg swelling. He does complain of mild left headache when he combs his hair and when he  touches that region of the head. He denies change in vision or any cognition problem.  Patient regularly takes B-Complex supplement and Vitamin-D supplement.   Patient is not taking baclofen 10 mg, Nausea medications,  and Protonix 40 mg. Otherwise, he is complaint with other medications.   He notes his running low on Metoprolol 25 mg.  REVIEW OF SYSTEMS:    10 Point review of systems of done and is negative except as noted above.  Past Medical History:  Diagnosis Date   Cancer [facility])     prostate   Hypertension    Past Surgical History:  Procedure Laterality Date   LYMPHADENECTOMY Bilateral 09/01/2019   Procedure: LYMPHADENECTOMY;  Surgeon: Alexis Frock, MD;  Location: [facility];  Service: Urology;  Laterality: Bilateral;   ROBOT ASSISTED LAPAROSCOPIC RADICAL PROSTATECTOMY N/A 09/01/2019   Procedure: XI ROBOTIC ASSISTED LAPAROSCOPIC RADICAL PROSTATECTOMY;  Surgeon: Alexis Frock, MD;  Location: [facility];  Service: Urology;  Laterality: N/A;  3 HRS   Social History   Tobacco Use   Smoking status: Never   Smokeless tobacco: Never  Vaping Use   Vaping Use: Never used  Substance Use Topics   Alcohol use: Yes    Comment: at least one 40 oz beer daily   Drug use: Never    ALLERGIES:  is allergic to amlodipine.  MEDICATIONS:  Current Outpatient Medications  Medication Sig Dispense Refill   B Complex Vitamins (VITAMIN B COMPLEX) TABS Take 1 tablet by mouth daily. 30 tablet 0   baclofen (LIORESAL) 10 MG tablet Take 10 mg by mouth 3 (three) times daily.     cholecalciferol (VITAMIN D3) 25 MCG (1000 UNIT) tablet Take 2 tablets (2,000 Units total) by mouth daily.     clotrimazole-betamethasone (LOTRISONE) cream Apply 1 Application topically 2 (two) times daily. 45 g 0   folic acid (FOLVITE) 1 MG tablet Take 1 tablet (1 mg total) by mouth daily. 30 tablet 0   lisinopril (ZESTRIL) 5 MG tablet Take 5 mg by mouth daily.     Menthol-Methyl Salicylate (MUSCLE RUB) 10-15 % CREA Apply 1 Application topically daily as needed for muscle pain.     metoprolol tartrate (LOPRESSOR) 25 MG tablet Take 0.5 tablets (12.5 mg total) by mouth 2 (two) times daily. 60 tablet 0   Multiple Vitamin (MULTIVITAMIN WITH MINERALS) TABS tablet Take 1 tablet by mouth daily.     ondansetron (ZOFRAN-ODT) 4 MG disintegrating tablet '4mg'$  ODT q4 hours prn nausea/vomit 20 tablet 0   pantoprazole (PROTONIX) 40 MG tablet Take 1 tablet (40 mg total) by mouth daily before breakfast. 30 tablet 0   polyethylene  glycol powder (GLYCOLAX/MIRALAX) 17 GM/SCOOP powder Take 17 g by mouth daily as needed for moderate constipation. 238 g 0   senna-docusate (SENOKOT-S) 8.6-50 MG tablet Take 2 tablets by mouth at bedtime as needed for mild constipation. 60 tablet 0   thiamine (VITAMIN B1) 100 MG tablet Take 1 tablet (100 mg total) by mouth daily. 30 tablet 0   No current facility-administered medications for this visit.    PHYSICAL EXAMINATION: ECOG PERFORMANCE STATUS: 1 - Symptomatic but completely ambulatory  . Vitals:   03/11/22 1231  BP: (!) 142/83  Pulse: 69  Resp: 18  Temp: 97.7 F (36.5 C)  SpO2: 100%     Filed Weights   03/11/22 1231  Weight: 194 lb 12.8 oz (88.4 kg)    .Body mass index is 30.51 kg/m.  GENERAL:alert, in no acute distress and comfortable SKIN: no acute  rashes, no significant lesions EYES: conjunctiva are pink and non-injected, sclera anicteric OROPHARYNX: MMM, no exudates, no oropharyngeal erythema or ulceration. Right lower dental cavity.  NECK: supple, no JVD LYMPH:  palpable lymphadenopathy in the right anterior cervical region.  No axillary or inguinal lymphadenopathy LUNGS: clear to auscultation b/l with normal respiratory effort HEART: regular rate & rhythm ABDOMEN:  normoactive bowel sounds , non tender, not distended. Extremity: no pedal edema PSYCH: alert & oriented x 3 with fluent speech NEURO: no focal motor/sensory deficits  LABORATORY DATA:   I have reviewed the data as listed .    Latest Ref Rng & Units 03/11/2022   12:14 PM 02/08/2022   10:36 AM 01/25/2022    9:03 AM  CBC  WBC 4.0 - 10.5 K/uL 5.8  4.4  5.0   Hemoglobin 13.0 - 17.0 g/dL 14.8  11.3  10.1   Hematocrit 39.0 - 52.0 % 44.0  36.0  32.0   Platelets 150 - 400 K/uL 204  212  74    .    Latest Ref Rng & Units 03/11/2022   12:14 PM 02/08/2022   10:36 AM 01/25/2022    9:03 AM  CMP  Glucose 70 - 99 mg/dL 94  84  118   BUN 8 - 23 mg/dL '14  7  9   '$ Creatinine 0.61 - 1.24 mg/dL 0.73   0.69  0.72   Sodium 135 - 145 mmol/L 139  142  140   Potassium 3.5 - 5.1 mmol/L 3.8  3.8  3.7   Chloride 98 - 111 mmol/L 103  108  104   CO2 22 - 32 mmol/L '29  27  27   '$ Calcium 8.9 - 10.3 mg/dL 9.3  9.1  9.3   Total Protein 6.5 - 8.1 g/dL 7.1  6.2  6.8   Total Bilirubin 0.3 - 1.2 mg/dL 0.4  0.5  1.0   Alkaline Phos 38 - 126 U/L 111  142  139   AST 15 - 41 U/L 19  47  36   ALT 0 - 44 U/L 17  53  54     RADIOGRAPHIC STUDIES: I have personally reviewed the radiological images as listed and agreed with the findings in the report. No results found.  ASSESSMENT & PLAN:   72 yo with  Low grade MDS  PLAN: -Discussed lab results from today, 03/11/2022, with the patient. CBC is stable, CMP is wnl -Answered all of patient's and his daughter's questions.  -Educated the patient on low grade MDS.  -Encouraged continued balanced diet and hydration. -Advised to continue B complex and Vitamin D. -Again strongly advised pt not to take NSAIDs. He may use Tylenol prn for his back pain. -Continue with PCP follow up for back pain management. He would also like to discuss orthospine with PCP next week. -Encouraged him to stay UTD with his vaccines including flu and COVID boosters. -Patient will let us know if his headache persists.     FOLLOW UP: RTC with Dr Irene Limbo with labs in 4 months  The total time spent in the appointment was 20 minutes* .  All of the patient's questions were answered with apparent satisfaction. The patient knows to call the clinic with any problems, questions or concerns.   Sullivan Lone MD MS AAHIVMS [facility] Hematology/Oncology Physician [facility]  .*Total Encounter Time as defined by the Centers for Medicare and Medicaid Services includes, in addition to the face-to-face time of a patient visit (  documented in the note above) non-face-to-face time: obtaining and reviewing outside history, ordering and reviewing medications, tests or procedures, care  coordination (communications with other health care professionals or caregivers) and documentation in the medical record.   I, Cleda Mccreedy, am acting as a Education administrator for Sullivan Lone, MD.  .I have reviewed the above documentation for accuracy and completeness, and I agree with the above. Brunetta Genera MD

## 2022-04-05 ENCOUNTER — Ambulatory Visit: Payer: 59 | Admitting: Physical Therapy

## 2022-04-12 ENCOUNTER — Ambulatory Visit (INDEPENDENT_AMBULATORY_CARE_PROVIDER_SITE_OTHER): Payer: 59 | Admitting: Family Medicine

## 2022-04-12 VITALS — BP 150/88 | Ht 67.0 in | Wt 178.0 lb

## 2022-04-12 DIAGNOSIS — I10 Essential (primary) hypertension: Secondary | ICD-10-CM

## 2022-04-12 DIAGNOSIS — M25551 Pain in right hip: Secondary | ICD-10-CM | POA: Diagnosis not present

## 2022-04-12 MED ORDER — DICLOFENAC SODIUM 1 % EX GEL: 4.0000 g | Freq: Four times a day (QID) | CUTANEOUS | 1 refills | Status: DC

## 2022-04-12 NOTE — Assessment & Plan Note (Signed)
Elevated blood pressure today on initial and recheck.  We recommend he follow-up with his PCP in the next 1 to 2 weeks.

## 2022-04-12 NOTE — Patient Instructions (Signed)
[facility] Address: [address] [place], [place], [place] [address] Phone: [phone]

## 2022-04-12 NOTE — Progress Notes (Signed)
[place] Attending Note: I have seen and examined this patient with the medical student. I have  reviewed the history, physical examination, assessment and plan as documented in the medical student's note.  I agree with the medical student's note and findings, assessment and treatment plan as documented with the following additions or changes: Entire office visit was conducted with in person interpreter.  His grandson was also there.  I have examined the patient discussed with the medical student.  I agree with his documentation.  Additionally. Right hip and upper leg pain.  Had steroid injection of her greater trochanteric bursitis at last office visit.  That improved his pain by about 30 or 40%.  He is back today complaining of pain in the hip and the lateral thigh.  He also says he has some knee pain.  Exam reveals tenderness to palpation over the greater trochanteric bursa area and all along the IT band on the lateral thigh.  The knee is without any tenderness to palpation along the joint lines.  Full range of motion flexion extension.  Ligamentously intact.  No effusion.  Assessment: Greater trochanteric bursitis improved but not resolved.  My exam of his knee is without specific findings so I think his thigh pain is related to the hip issue not the knee.  We discussed this at length.  I offered another corticosteroid injection which would be his second.  He wanted to defer that.  Was not placed on home exercise program at last office visit so we did that today with hip abduction exercises.  Will also use diclofenac gel 4 times daily.  Follow-up 2 to 3 weeks.

## 2022-04-12 NOTE — Progress Notes (Addendum)
  Tyler [name] - 72 y.o. male MRN [id number]  Date of birth: 18-May-1950  SUBJECTIVE:  Including CC & ROS.  CC: Right hip and knee pain  HPI: Patient is presenting for evaluation of ongoing right hip and knee pain. He is accompanied by an interpreter and a family member. He received a R greater trochanter injection at his previous visit 6 weeks ago and feels it has improved less than 50%. He feels that his back pain has improved. He was referred to PT in the past but has had trouble starting due to issues with insurance.   He notes most of his pain in the lateral thigh and knee especially when laying on the contralateral side. Endorses occasional numbness in the lateral thigh and around his knee. Notes that the symptoms are worsened in the cold weather. Denies any weakness, gait issues, or incontinence.    HISTORY: Past Medical, Surgical, Social, and Family History Reviewed & Updated per EMR.   Pertinent Historical Findings include: H/o MDS Lumbosacral stenosis  PHYSICAL EXAM:  VS: BP:(!) 150/88  HR: bpm  TEMP: ( )  RESP:   HT:5' 7"$  (170.2 cm)   WT:178 lb (80.7 kg)  BMI:27.87 PHYSICAL EXAM: Right Hip:  - Inspection: No gross deformity, no swelling, erythema, or ecchymosis - Palpation: Tender to deep palpation over greater trochanter and lateral thigh - ROM: Normal range of motion on Flexion, extension, abduction, internal and external rotation - Strength: 5/5 strength excep 4/5 on R hip abduction. - Neuro/vasc: NV intact distally - Special Tests: Negative FABER and FADIR  Right Knee: - Inspection: no gross deformity. No swelling/effusion, erythema or bruising. Skin intact - Palpation: Diffusely TTP to light touch - ROM: full active ROM with flexion and extension in knee and hip - Strength: 5/5 strength - Neuro/vasc: NV intact - Special Tests: - LIGAMENTS: negative anterior and posterior drawer, negative Lachman's, no MCL or LCL laxity  -- MENISCUS: negative McMurray's  -- PF  JOINT: nml patellar mobility bilaterally.   ASSESSMENT & PLAN: See problem based charting & AVS for pt instructions.  Greater trochanter pain syndrome - Patient likely continues to have GTPS given tenderness to lateral hip and weakness with hip abduction. May also have component of IT band syndrome with knee pain. No concern for worsening radicular symptoms. Could consider another trochanteric injection in the future.  - Topical diclofenac gel - Provided home exercises for hip and quad strengthening - Follow up in 3 weeks  Estevan Oaks, [facility]

## 2022-04-14 ENCOUNTER — Other Ambulatory Visit: Payer: Self-pay | Admitting: Family Medicine

## 2022-04-15 DIAGNOSIS — R310 Gross hematuria: Secondary | ICD-10-CM | POA: Diagnosis not present

## 2022-04-15 DIAGNOSIS — N393 Stress incontinence (female) (male): Secondary | ICD-10-CM | POA: Diagnosis not present

## 2022-04-16 ENCOUNTER — Other Ambulatory Visit (HOSPITAL_COMMUNITY): Payer: Self-pay

## 2022-05-03 ENCOUNTER — Ambulatory Visit (INDEPENDENT_AMBULATORY_CARE_PROVIDER_SITE_OTHER): Payer: 59 | Admitting: Sports Medicine

## 2022-05-03 VITALS — BP 138/80 | Ht 67.0 in | Wt 180.0 lb

## 2022-05-03 DIAGNOSIS — M25551 Pain in right hip: Secondary | ICD-10-CM | POA: Diagnosis not present

## 2022-05-03 NOTE — Progress Notes (Signed)
PCP: [name], FNP  Subjective:   HPI: Patient is a 72 y.o. male here for right hip pain follow-up.  In person Swahili interpreter present.  Last seen in sports medicine clinic about 3 weeks ago on 2/16.  Felt to be greater trochanteric bursitis, home exercise program with hip abduction exercises and diclofenac gel.  Today, patient reports that his pain is overall improving. He still has occasional pain to his lateral hip as well as numbness down the leg only when the weather is cold.  Sometimes has pain in the right groin area as well.  He does not have any problems in warm weather. He reports doing home exercises daily. He is able to lay on his side without any problems. Last night he had some cramps in his right leg after walking a long distance during the day. Denies back pain.  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 No neural foraminal narrowing.  Past Medical History:  Diagnosis Date   Cancer [facility])    prostate   Hypertension     Current Outpatient Medications on File Prior to Visit  Medication Sig Dispense Refill   B Complex Vitamins (VITAMIN B COMPLEX) TABS Take 1 tablet by mouth daily. 30 tablet 0   cholecalciferol (VITAMIN D3) 25 MCG (1000 UNIT) tablet Take 2 tablets (2,000 Units total) by mouth daily.     diclofenac Sodium (VOLTAREN) 1 % GEL APPLY 4 G TOPICALLY 4 (FOUR) TIMES DAILY. APPLY TO RIGHT LATERAL HIP AND THIGH DOWN TO KNEE 300 g 1   lisinopril (ZESTRIL) 5 MG tablet Take 5 mg by mouth daily.     Menthol-Methyl Salicylate (MUSCLE RUB) 10-15 % CREA Apply 1 Application topically daily as needed for muscle pain.     Multiple Vitamin (MULTIVITAMIN WITH MINERALS) TABS tablet Take 1 tablet by mouth daily.     No current  facility-administered medications on file prior to visit.    Past Surgical History:  Procedure Laterality Date   LYMPHADENECTOMY Bilateral 09/01/2019   Procedure: LYMPHADENECTOMY;  Surgeon: Alexis Frock, MD;  Location: [facility];  Service: Urology;  Laterality: Bilateral;   ROBOT ASSISTED LAPAROSCOPIC RADICAL PROSTATECTOMY N/A 09/01/2019   Procedure: XI ROBOTIC ASSISTED LAPAROSCOPIC RADICAL PROSTATECTOMY;  Surgeon: Alexis Frock, MD;  Location: [facility];  Service: Urology;  Laterality: N/A;  3 HRS    Allergies  Allergen Reactions   Amlodipine Swelling    BILATERAL FEET TO ANKLES    Social History   Socioeconomic History   Marital status: Married    Spouse name: Not on file   Number of children: Not on file   Years of education: Not on file   Highest education level: Not on file  Occupational History   Not on file  Tobacco Use   Smoking status: Never   Smokeless tobacco: Never  Vaping Use   Vaping Use: Never used  Substance and Sexual Activity   Alcohol use: Yes    Comment: at least one 40 oz beer daily   Drug use: Never   Sexual activity: Not on file  Other Topics Concern   Not on file  Social History Narrative   Not on file   Social Determinants of Health   Financial Resource Strain: Not on file  Food Insecurity: No Food Insecurity (  12/18/2021)   Hunger Vital Sign    Worried About Running Out of Food in the Last Year: Never true    [place] in the Last Year: Never true  Transportation Needs: No Transportation Needs (12/18/2021)   PRAPARE - Hydrologist (Medical): No    Lack of Transportation (Non-Medical): No  Physical Activity: Not on file  Stress: Not on file  Social Connections: Not on file  Intimate Partner Violence: Not At Risk (12/18/2021)   Humiliation, Afraid, Rape, and Kick questionnaire    Fear of Current or Ex-Partner: No    Emotionally Abused: No    Physically Abused: No    Sexually Abused: No    Family  History  Problem Relation Age of Onset   Stroke Father     BP 138/80   Ht '5\' 7"'$  (1.702 m)   Wt 180 lb (81.6 kg)   BMI 28.19 kg/m   Review of Systems: See HPI above.     Objective:  Physical Exam:  Gen: awake, alert, NAD, comfortable in exam room Pulm: breathing unlabored  Back: No tenderness to palpation along midline spine or paraspinal muscles.  Negative straight leg raise bilaterally. Right hip: Tenderness to palpation along the lateral hip and the IT band distribution from the iliac spine to the superior lateral aspect of the knee.  No significant tenderness to the greater trochanter.  Some discomfort elicited with external rotation of the hip.  4/5 strength with hip flexion, hip extension, and hip abduction bilaterally.  2+ PT pulses.   Assessment & Plan:  1.  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  Overall seems to be improving with home exercise program.  No focal weakness on exam.  Unclear whether numbness is related to the hip versus the spine given prior imaging findings.  He may benefit from neurosurgery evaluation. - continue home exercises focusing on IT band - offered neurosurgery referral, patient prefers to continue to monitor given improvement which is reasonable - follow-up in 3 weeks  Zola Button, MD [place], PGY-3  FELLOW ATTESTATION: I personally evaluated the patient with the resident and agree with the above documentation with following emphasis: Right hip pain has gotten mildly better with home exercises.  Not 100% yet.  Only bothers him intermittently.  Occasional numbness and tingling down the leg as well.  Still very tender to palpation at the IT band on the right side.  He wants to continue conservative treatment for now with continued home exercises.  Will check him again in 3 weeks, if no improvement then would refer him to Dr.Moore, neurosurgery for second opinion.  Dortha Kern,  MD 05/03/2022  I was the preceptor for this visit and available for immediate consultation Shellia Cleverly, DO

## 2022-05-23 ENCOUNTER — Emergency Department (HOSPITAL_COMMUNITY): Payer: 59

## 2022-05-23 ENCOUNTER — Encounter (HOSPITAL_COMMUNITY): Payer: Self-pay | Admitting: Emergency Medicine

## 2022-05-23 ENCOUNTER — Other Ambulatory Visit: Payer: Self-pay

## 2022-05-23 ENCOUNTER — Emergency Department (HOSPITAL_COMMUNITY)
Admission: EM | Admit: 2022-05-23 | Discharge: 2022-05-23 | Disposition: A | Discharge status: Home / Self Care | Payer: 59 | Attending: Emergency Medicine | Admitting: Emergency Medicine

## 2022-05-23 DIAGNOSIS — R059 Cough, unspecified: Secondary | ICD-10-CM | POA: Diagnosis not present

## 2022-05-23 DIAGNOSIS — Z79899 Other long term (current) drug therapy: Secondary | ICD-10-CM | POA: Diagnosis not present

## 2022-05-23 DIAGNOSIS — R042 Hemoptysis: Secondary | ICD-10-CM | POA: Diagnosis not present

## 2022-05-23 DIAGNOSIS — R001 Bradycardia, unspecified: Secondary | ICD-10-CM | POA: Diagnosis not present

## 2022-05-23 LAB — CBC
HCT: 45.1 % (ref 39.0–52.0)
Hemoglobin: 14.6 g/dL (ref 13.0–17.0)
MCH: 28.3 pg (ref 26.0–34.0)
MCHC: 32.4 g/dL (ref 30.0–36.0)
MCV: 87.6 fL (ref 80.0–100.0)
Platelets: 291 10*3/uL (ref 150–400)
RBC: 5.15 MIL/uL (ref 4.22–5.81)
RDW: 14.4 % (ref 11.5–15.5)
WBC: 6.6 10*3/uL (ref 4.0–10.5)
nRBC: 0 % (ref 0.0–0.2)

## 2022-05-23 LAB — COMPREHENSIVE METABOLIC PANEL
ALT: 61 U/L — ABNORMAL HIGH (ref 0–44)
AST: 44 U/L — ABNORMAL HIGH (ref 15–41)
Albumin: 3.8 g/dL (ref 3.5–5.0)
Alkaline Phosphatase: 111 U/L (ref 38–126)
Anion gap: 15 (ref 5–15)
BUN: 9 mg/dL (ref 8–23)
CO2: 24 mmol/L (ref 22–32)
Calcium: 9.4 mg/dL (ref 8.9–10.3)
Chloride: 99 mmol/L (ref 98–111)
Creatinine, Ser: 0.95 mg/dL (ref 0.61–1.24)
GFR, Estimated: >60 mL/min (ref >60)
Glucose, Bld: 100 mg/dL — ABNORMAL HIGH (ref 70–99)
Potassium: 4 mmol/L (ref 3.5–5.1)
Sodium: 138 mmol/L (ref 135–145)
Total Bilirubin: 0.7 mg/dL (ref 0.3–1.2)
Total Protein: 7.6 g/dL (ref 6.5–8.1)

## 2022-05-23 NOTE — ED Triage Notes (Signed)
Patient POV from home c/o coughing up blood intermittently for over a week now- noticed this on Monday after he ate lunch and took a nap. States he also felt bloated at the time and complained of general abdominal pain but has been relieved by prilosec. Denies blood in urine or bowel movements. Denies being on a blood thinner. Denies chest pai, shortness of breath.

## 2022-05-23 NOTE — ED Provider Notes (Signed)
[facility] Provider Note   CSN: [id number] Arrival date & time: 05/23/22  1415     History  Chief Complaint  Patient presents with   Hemoptysis    Tyler [name] is a 72 y.o. male.  72 year old male with hypertension who presents to the emergency department with hemoptysis.  Patient reports that for the past 8 days has been having a cough in the morning.  Started to become blood-streaked recently.  Says it is small-volume and only in the morning and does not happen during the day.  Says that this was preceded by several days of reflux that are worse than usual.  Was prescribed an antacid for this.  Denies any chest pain, shortness of breath, fevers, chills, night sweats, weight loss.  No tobacco smoking history.  No history of TB.        Home Medications Prior to Admission medications   Medication Sig Start Date End Date Taking? Authorizing Provider  B Complex Vitamins (VITAMIN B COMPLEX) TABS Take 1 tablet by mouth daily. 12/21/21   Amin, Jeanella Flattery, MD  cholecalciferol (VITAMIN D3) 25 MCG (1000 UNIT) tablet Take 2 tablets (2,000 Units total) by mouth daily. 01/21/22   Brunetta Genera, MD  diclofenac Sodium (VOLTAREN) 1 % GEL APPLY 4 G TOPICALLY 4 (FOUR) TIMES DAILY. APPLY TO RIGHT LATERAL HIP AND THIGH DOWN TO KNEE 04/15/22   Dickie La, MD  lisinopril (ZESTRIL) 5 MG tablet Take 5 mg by mouth daily.    [provider]  Menthol-Methyl Salicylate (MUSCLE RUB) 10-15 % CREA Apply 1 Application topically daily as needed for muscle pain.    [provider]  Multiple Vitamin (MULTIVITAMIN WITH MINERALS) TABS tablet Take 1 tablet by mouth daily. 12/21/21   Damita Lack, MD      Allergies    Amlodipine    Review of Systems   Review of Systems  Physical Exam Updated Vital Signs BP (!) 146/89   Pulse (!) 55   Temp 98.3 F (36.8 C) (Oral)   Resp (!) 22   Ht 5\' 7"  (1.702 m)   Wt 81 kg   SpO2 99%   BMI  27.97 kg/m  Physical Exam Vitals and nursing note reviewed.  Constitutional:      General: He is not in acute distress.    Appearance: He is well-developed.  HENT:     Head: Normocephalic and atraumatic.     Right Ear: External ear normal.     Left Ear: External ear normal.     Nose: Nose normal.  Eyes:     Extraocular Movements: Extraocular movements intact.     Conjunctiva/sclera: Conjunctivae normal.     Pupils: Pupils are equal, round, and reactive to light.  Cardiovascular:     Rate and Rhythm: Regular rhythm. Bradycardia present.     Heart sounds: Normal heart sounds.  Pulmonary:     Effort: Pulmonary effort is normal. No respiratory distress.     Breath sounds: Normal breath sounds.  Abdominal:     General: There is no distension.     Palpations: Abdomen is soft. There is no mass.     Tenderness: There is no abdominal tenderness. There is no guarding.  Musculoskeletal:     Cervical back: Normal range of motion and neck supple.     Right lower leg: No edema.     Left lower leg: No edema.  Skin:    General: Skin is warm  and dry.  Neurological:     Mental Status: He is alert. Mental status is at baseline.  Psychiatric:        Mood and Affect: Mood normal.        Behavior: Behavior normal.     ED Results / Procedures / Treatments   Labs (all labs ordered are listed, but only abnormal results are displayed) Labs Reviewed  COMPREHENSIVE METABOLIC PANEL - Abnormal; Notable for the following components:      Result Value   Glucose, Bld 100 (*)    AST 44 (*)    ALT 61 (*)    All other components within normal limits  CBC    EKG None  Radiology DG Chest 2 View  Result Date: 05/23/2022 CLINICAL DATA:  Hemoptysis. Per triage note, patient reports coughing up blood intermittently for over a week. EXAM: CHEST - 2 VIEW COMPARISON:  Chest radiograph 12/19/2021, 12/10/2021; CT angio chest 12/15/2021 FINDINGS: Prominent pericardial fat pad. Stable cardiac silhouette.  No focal consolidation, pleural effusion, or pneumothorax. Multilevel degenerative changes in the mid to distal thoracic spine. IMPRESSION: No active cardiopulmonary disease. Electronically Signed   By: Ileana Roup M.D.   On: 05/23/2022 15:19    Procedures Procedures   Medications Ordered in ED Medications - No data to display  ED Course/ Medical Decision Making/ A&P                             Medical Decision Making Amount and/or Complexity of Data Reviewed Labs: ordered. Radiology: ordered.   Tyler [name] is a 72 y.o. male with comorbidities that complicate the patient evaluation including HTN who presents with hemoptysis   Initial Ddx:  GERD, bronchitis, PE, pneumonia, bronchoalveolar hemorrhage, lung cancer  MDM:  Feel the patient's cough in the morning may be due to his GERD.  May be bloody from the persistent coughing that he has had.  No infectious symptoms to suggest bronchitis or pneumonia or TB.  Does not have any chest pain or shortness of breath or tachycardia or hypoxia to suggest pulmonary embolism.  Given the scant amount of blood and the fact that it only occurs in the morning feel that bronchial alveolar hemorrhage is less likely as well. No tobacco use or weight loss to suggest malignancy.   Plan:  Labs  CXR  ED Summary/Re-evaluation:  Patient reassessed and was stable.  Did not have any evidence of hypoxia.  Did not develop any shortness of breath or chest pain.  No hemoptysis while in the emergency department.  Unclear exactly what is causing his symptoms I do not feel that there are any life-threatening causes at play at this time.  Will have him follow-up with his primary doctor regarding his symptoms in several days.  This patient presents to the ED for concern of complaints listed in HPI, this involves an extensive number of treatment options, and is a complaint that carries with it a high risk of complications and morbidity. Disposition including potential  need for admission considered.   Dispo: DC Home. Return precautions discussed including, but not limited to, those listed in the AVS. Allowed pt time to ask questions which were answered fully prior to dc.  Additional history obtained from spouse Records reviewed Outpatient Clinic Notes I independently reviewed the following imaging with scope of interpretation limited to determining acute life threatening conditions related to emergency care: Chest x-ray and agree with the radiologist interpretation with the  following exceptions: none I personally reviewed and interpreted cardiac monitoring: normal sinus rhythm  I personally reviewed and interpreted the pt's EKG: see above for interpretation  I have reviewed the patients home medications and made adjustments as needed  Final Clinical Impression(s) / ED Diagnoses Final diagnoses:  Hemoptysis    Rx / DC Orders ED Discharge Orders     None         Fransico Meadow, MD 05/24/22 [phone]

## 2022-05-23 NOTE — Discharge Instructions (Signed)
You were seen for your bloody cough (hemoptysis) in the emergency department.   At home, please continue to take your antacids as prescribed.    Follow-up with your primary doctor in 2-3 days regarding your visit.    Return immediately to the emergency department if you experience any of the following: Worsening bloody cough, chest pain, shortness of, or any other concerning symptoms.    Thank you for visiting our Emergency Department. It was a pleasure taking care of you today.

## 2022-05-29 DIAGNOSIS — R198 Other specified symptoms and signs involving the digestive system and abdomen: Secondary | ICD-10-CM | POA: Diagnosis not present

## 2022-05-29 DIAGNOSIS — M5440 Lumbago with sciatica, unspecified side: Secondary | ICD-10-CM | POA: Diagnosis not present

## 2022-05-29 DIAGNOSIS — R748 Abnormal levels of other serum enzymes: Secondary | ICD-10-CM | POA: Diagnosis not present

## 2022-05-29 DIAGNOSIS — Z1211 Encounter for screening for malignant neoplasm of colon: Secondary | ICD-10-CM | POA: Diagnosis not present

## 2022-05-31 ENCOUNTER — Encounter: Payer: Self-pay | Admitting: Family Medicine

## 2022-05-31 ENCOUNTER — Ambulatory Visit (INDEPENDENT_AMBULATORY_CARE_PROVIDER_SITE_OTHER): Payer: 59 | Admitting: Family Medicine

## 2022-05-31 VITALS — BP 126/82 | Ht 67.0 in | Wt 178.0 lb

## 2022-05-31 DIAGNOSIS — M5416 Radiculopathy, lumbar region: Secondary | ICD-10-CM | POA: Diagnosis not present

## 2022-05-31 MED ORDER — GABAPENTIN 100 MG PO CAPS: ORAL_CAPSULE | ORAL | 0 refills | Status: DC

## 2022-05-31 NOTE — Progress Notes (Signed)
Established Patient Office Visit  Subjective   Patient ID: Tyler [name], male    DOB: 24-Apr-1950  Age: 72 y.o. MRN: [id number]  Back and leg pain.  Tyler [name] is here today with his son-in-law and a translator for follow-up of right-sided back and leg pain.  He has previously had both knee and greater trochanteric hip injections that gave him minimal relief.  He is using some topical Voltaren but seems to also give him only temporary relief.  The pain numbness and tingling stays on the lateral aspect of his leg radiates down to about the mid calf.  He describes it as a sharp pain with some pins-and-needles sensation.  He states this is keeping him from walking much further than even half mile.  He used to be able to walk 7 miles.  He does not use any assistive devices.  He has been evaluated once in the past for surgical intervention however secondary to his blood count he is unable to proceed without intervention.  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 Diffusely decreased marrow signal, slightly hyperintense to skeletal muscle, likely red marrow reconversion. Additional speckled marrow signal in the bilateral iliac bones and, to a lesser extent,vertebral bodies, is favored to be benign given its appearance on the 12/10/2021 CT. he denies any loss of bowel or bladder control.   ROS as listed above in HPI    Objective:     BP 126/82   Ht 5\' 7"  (1.702 m)   Wt 178 lb (80.7 kg)   BMI 27.88 kg/m   Physical Exam Vitals reviewed.  Constitutional:      General: He is not in acute distress.    Appearance: Normal appearance. He is normal weight. He is not ill-appearing, toxic-appearing or diaphoretic.  Pulmonary:     Effort: Pulmonary  effort is normal.  Neurological:     Mental Status: He is alert.   R low back and leg: No obvious deformity or asymmetry.  No ecchymosis edema or effusion.  Full range of motion at the hip, knee and with flexion and extension of the lumbar spine.  Tenderness to palpation along the lateral leg, iliotibial band and quad muscles.  Strength 5/5 resisted knee flexion.  Sensation intact to light touch of the lower extremity.  Positive seated straight leg raise on the right.  Sensation intact to light touch. 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  Moderate to severe thecal sac narrowing.  Patient has tried home therapy, some greater trochanteric injections and topical Voltaren gel with minimal relief of his pain and numbness tingling.  At 1 point in time he was being evaluated for possible injections however secondary to his blood count at that time he was not a candidate.  We will send him for further consultation to discuss possible injections  again, Dr Yevette Edwards.  I have also sent gabapentin for him to try.  100 mg at night and slowly then titrate to 200 mg at night as tolerated.      Relevant Medications   gabapentin (NEURONTIN) 100 MG capsule    No follow-ups on file.    Claudie Leach, DO

## 2022-05-31 NOTE — Progress Notes (Signed)
[facility]: Attending Note: I have examined the patient, reviewed the chart, discussed the assessment and plan with the Sports Medicine Fellow. I agree with assessment and treatment plan as detailed in the Fellow's note. Continued right sided lower extremity pain. Last MRI reviewed from 10/23.I  IMPRESSION: 1. 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 No neural foraminal narrowing. 2. Diffusely decreased marrow signal, slightly hyperintense to skeletal muscle, likely red marrow reconversion. Additional speckled marrow signal in the bilateral iliac bones and, to a lesser extent, vertebral bodies, is favored to be benign given its appearance on the 12/10/2021 CT. A diffuse infiltrative bone marrow process is felt to be less likely. Consider correlation with serum labs.  Unclear if he would benefit from or be a candidate for epidural steroid etc, so we will send hiom to spine surgeon for further eval. He is likely not a candidate for actual major surgical intervention given his other co morbidities, but there may be some possibilities. We will also start a very low dose of gabapentin with very slow titration up and see him back in 4-6 weeks. In person Interpretor used for entire office visit

## 2022-05-31 NOTE — Patient Instructions (Addendum)
Right leg pain that I think is partially related to lumbar spine issues.    Reviewed his MRI.  He has some other complicated medical problems that may or may not preclude him having any type of interventional surgery, but I would like to get at least an evaluation by  back surgeon to see if he agrees that the lumbar spine issues are causing his some of his sx of right radiculopathy.  We will set that up.  Additionally, I will try him on 100 mg of Neurontin at night.  Due to his age and comorbidities we will very gradually go up to 200 mg at night after 2 weeks.  I will see him back in 4 to 6 weeks.     [address] [place] [phone] Dr Yevette Edwards Mon 06/10/22 at 130p Arrival time is 1245p

## 2022-05-31 NOTE — Assessment & Plan Note (Signed)
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  Moderate to severe thecal sac narrowing.  Patient has tried home therapy, some greater trochanteric injections and topical Voltaren gel with minimal relief of his pain and numbness tingling.  At 1 point in time he was being evaluated for possible injections however secondary to his blood count at that time he was not a candidate.  We will send him for further consultation to discuss possible injections again, Dr Yevette Edwards.  I have also sent gabapentin for him to try.  100 mg at night and slowly then titrate to 200 mg at night as tolerated.

## 2022-06-12 ENCOUNTER — Other Ambulatory Visit: Payer: Self-pay | Admitting: Family Medicine

## 2022-06-14 DIAGNOSIS — M5416 Radiculopathy, lumbar region: Secondary | ICD-10-CM | POA: Diagnosis not present

## 2022-06-27 ENCOUNTER — Other Ambulatory Visit: Payer: Self-pay

## 2022-06-27 DIAGNOSIS — D469 Myelodysplastic syndrome, unspecified: Secondary | ICD-10-CM

## 2022-06-28 ENCOUNTER — Encounter: Payer: Self-pay | Admitting: Family Medicine

## 2022-06-28 ENCOUNTER — Ambulatory Visit (INDEPENDENT_AMBULATORY_CARE_PROVIDER_SITE_OTHER): Payer: 59 | Admitting: Family Medicine

## 2022-06-28 VITALS — BP 120/78 | Ht 67.0 in | Wt 178.0 lb

## 2022-06-28 DIAGNOSIS — M5416 Radiculopathy, lumbar region: Secondary | ICD-10-CM

## 2022-06-28 MED ORDER — AMITRIPTYLINE HCL 10 MG PO TABS: 10.0000 mg | ORAL_TABLET | Freq: Every day | ORAL | 1 refills | Status: DC

## 2022-06-28 NOTE — Progress Notes (Signed)
  Tyler [name] - 72 y.o. male MRN [id number]  Date of birth: 10-25-1950    CHIEF COMPLAINT:   back pain    SUBJECTIVE:   HPI:  Pleasant 72 year old male here for follow-up of his low back pain.  Since his last visit he has seen Dr. Yevette Edwards.  He referred the patient to Dr. Modesto Charon for diagnostic/therapeutic epidural corticosteroid injection.  His appoint with Dr. Modesto Charon is on Monday.  Patient unfortunately has continued to have persistence of his low back pain.  It radiates down the left leg.  Makes the leg very stiff.  He has neurogenic claudication with walking more than 15 yards.  He has been on gabapentin but has reported significant side effects with that, most notably too much drowsiness.  Dr. Yevette Edwards also wants to repeat his x-rays and lumbar spine MRI.  ROS:     See HPI  PERTINENT  PMH / PSH FH / / SH:  Past Medical, Surgical, Social, and Family History Reviewed & Updated in the EMR.  Pertinent findings include:  Myelodysplastic syndrome  OBJECTIVE: BP 120/78   Ht 5\' 7"  (1.702 m)   Wt 178 lb (80.7 kg)   BMI 27.88 kg/m   Physical Exam:  Vital signs are reviewed.  GEN: Alert and oriented, NAD Pulm: Breathing unlabored PSY: normal mood, congruent affect  MSK: Lumbar spine -no obvious deformity.  Tender to palpation at the lower lumbar spinous processes and the left paraspinal musculature.  Tender at the greater trochanter on the left.  4/5 strength with bilateral hip flexion.  Pain made worse with both forward flexion and extension.  Positive straight leg raise on the left.  Negative straight leg raise on the right.  Negative FADIR/FABER test bilaterally.  Negative logroll bilaterally.  Symmetric patellar reflexes.  Neurovascularly intact distally.  ASSESSMENT & PLAN:  1.  Chronic low back pain  -Given his side effects on gabapentin, we will switch this to amitriptyline 10 mg nightly.  Recommended he make his follow-up appointment on Monday with Dr.Wong for his  diagnostic/therapeutic epidural injection.  We discussed that I am hopeful this will give him the most therapeutic relief.  Dr. Yevette Edwards discussed with him that he is not a good candidate for surgery.  We will see him back in 1 month after his updated imaging and diagnostic/therapeutic injection to check his progress.  If no improvement, I would try a repeat steroid injection into the greater trochanter bursa on the left side, from which he had good therapeutic relief back in January.  I did not do that at this visit today in order to not cloud his response to his injection on Monday.  Arvella Nigh, MD PGY-4, Sports Medicine Fellow [facility]

## 2022-06-28 NOTE — Progress Notes (Signed)
[facility]: Attending Note: I have examined the patient, reviewed the chart, discussed the assessment and plan with the Sports Medicine Fellow. I agree with assessment and treatment plan as detailed in the Fellow's note.  Seing Dr. Modesto Charon next week. Could consider 2nd greater trochanteric injection next ov. Stop gabapention due to sleepiness and try low dose amitriptyline

## 2022-07-01 DIAGNOSIS — M48062 Spinal stenosis, lumbar region with neurogenic claudication: Secondary | ICD-10-CM | POA: Diagnosis not present

## 2022-07-06 ENCOUNTER — Emergency Department (HOSPITAL_COMMUNITY)
Admission: EM | Admit: 2022-07-06 | Discharge: 2022-07-07 | Disposition: A | Discharge status: Home / Self Care | Payer: 59 | Attending: Emergency Medicine | Admitting: Emergency Medicine

## 2022-07-06 ENCOUNTER — Other Ambulatory Visit: Payer: Self-pay

## 2022-07-06 ENCOUNTER — Encounter (HOSPITAL_COMMUNITY): Payer: Self-pay | Admitting: *Deleted

## 2022-07-06 DIAGNOSIS — Z79899 Other long term (current) drug therapy: Secondary | ICD-10-CM | POA: Diagnosis not present

## 2022-07-06 DIAGNOSIS — N281 Cyst of kidney, acquired: Secondary | ICD-10-CM | POA: Diagnosis not present

## 2022-07-06 DIAGNOSIS — R1031 Right lower quadrant pain: Secondary | ICD-10-CM | POA: Diagnosis not present

## 2022-07-06 DIAGNOSIS — R109 Unspecified abdominal pain: Secondary | ICD-10-CM | POA: Diagnosis not present

## 2022-07-06 DIAGNOSIS — R591 Generalized enlarged lymph nodes: Secondary | ICD-10-CM | POA: Insufficient documentation

## 2022-07-06 DIAGNOSIS — R590 Localized enlarged lymph nodes: Secondary | ICD-10-CM | POA: Diagnosis not present

## 2022-07-06 DIAGNOSIS — C801 Malignant (primary) neoplasm, unspecified: Secondary | ICD-10-CM | POA: Diagnosis not present

## 2022-07-06 LAB — COMPREHENSIVE METABOLIC PANEL
ALT: 24 U/L (ref 0–44)
AST: 25 U/L (ref 15–41)
Albumin: 2.9 g/dL — ABNORMAL LOW (ref 3.5–5.0)
Alkaline Phosphatase: 97 U/L (ref 38–126)
Anion gap: 13 (ref 5–15)
BUN: 11 mg/dL (ref 8–23)
CO2: 22 mmol/L (ref 22–32)
Calcium: 8.9 mg/dL (ref 8.9–10.3)
Chloride: 97 mmol/L — ABNORMAL LOW (ref 98–111)
Creatinine, Ser: 0.91 mg/dL (ref 0.61–1.24)
GFR, Estimated: >60 mL/min (ref >60)
Glucose, Bld: 101 mg/dL — ABNORMAL HIGH (ref 70–99)
Potassium: 4 mmol/L (ref 3.5–5.1)
Sodium: 132 mmol/L — ABNORMAL LOW (ref 135–145)
Total Bilirubin: 1 mg/dL (ref 0.3–1.2)
Total Protein: 6.7 g/dL (ref 6.5–8.1)

## 2022-07-06 LAB — CBC
HCT: 30.1 % — ABNORMAL LOW (ref 39.0–52.0)
Hemoglobin: 9.9 g/dL — ABNORMAL LOW (ref 13.0–17.0)
MCH: 26.6 pg (ref 26.0–34.0)
MCHC: 32.9 g/dL (ref 30.0–36.0)
MCV: 80.9 fL (ref 80.0–100.0)
Platelets: 208 10*3/uL (ref 150–400)
RBC: 3.72 MIL/uL — ABNORMAL LOW (ref 4.22–5.81)
RDW: 16.1 % — ABNORMAL HIGH (ref 11.5–15.5)
WBC: 10 10*3/uL (ref 4.0–10.5)
nRBC: 0.2 % (ref 0.0–0.2)

## 2022-07-06 LAB — LIPASE, BLOOD: Lipase: 24 U/L (ref 11–51)

## 2022-07-06 LAB — URINALYSIS, ROUTINE W REFLEX MICROSCOPIC
Bilirubin Urine: NEGATIVE
Glucose, UA: NEGATIVE mg/dL
Hgb urine dipstick: NEGATIVE
Ketones, ur: NEGATIVE mg/dL
Leukocytes,Ua: NEGATIVE
Nitrite: NEGATIVE
Protein, ur: NEGATIVE mg/dL
Specific Gravity, Urine: 1.018 (ref 1.005–1.030)
pH: 5 (ref 5.0–8.0)

## 2022-07-06 NOTE — ED Triage Notes (Signed)
Abd pain for one week with a distended abd  he also  may have had a temp not sure  he's been constipated no bm for  several days.   No blood seen in his stools

## 2022-07-06 NOTE — ED Notes (Signed)
The daughter intreprets for him  the pt speaks swahaalee

## 2022-07-07 ENCOUNTER — Emergency Department (HOSPITAL_COMMUNITY): Payer: 59

## 2022-07-07 DIAGNOSIS — C801 Malignant (primary) neoplasm, unspecified: Secondary | ICD-10-CM | POA: Diagnosis not present

## 2022-07-07 DIAGNOSIS — N281 Cyst of kidney, acquired: Secondary | ICD-10-CM | POA: Diagnosis not present

## 2022-07-07 DIAGNOSIS — R1031 Right lower quadrant pain: Secondary | ICD-10-CM | POA: Diagnosis not present

## 2022-07-07 DIAGNOSIS — R109 Unspecified abdominal pain: Secondary | ICD-10-CM | POA: Diagnosis not present

## 2022-07-07 MED ORDER — IOHEXOL 350 MG/ML SOLN
75.0000 mL | Freq: Once | INTRAVENOUS | Status: AC | PRN
  Administered 2022-07-07: 75 mL via INTRAVENOUS

## 2022-07-07 MED ORDER — MORPHINE SULFATE (PF) 4 MG/ML IV SOLN
4.0000 mg | Freq: Once | INTRAVENOUS | Status: AC
  Administered 2022-07-07: 4 mg via INTRAVENOUS
  Filled 2022-07-07: qty 1

## 2022-07-07 MED ORDER — SODIUM CHLORIDE 0.9 % IV BOLUS
500.0000 mL | Freq: Once | INTRAVENOUS | Status: AC
  Administered 2022-07-07: 500 mL via INTRAVENOUS

## 2022-07-07 MED ORDER — ONDANSETRON HCL 4 MG/2ML IJ SOLN
4.0000 mg | Freq: Once | INTRAMUSCULAR | Status: AC
  Administered 2022-07-07: 4 mg via INTRAVENOUS
  Filled 2022-07-07: qty 2

## 2022-07-07 NOTE — ED Provider Notes (Signed)
[place] EMERGENCY DEPARTMENT AT [facility] Provider Note   CSN: [id number] Arrival date & time: 07/06/22  2046     History {Add pertinent medical, surgical, social history, OB history to HPI:1} No chief complaint on file.   Tyler [name] is a 72 y.o. male.  Patient presents to the emergency department for evaluation of abdominal pain.  He is accompanied by his daughter who interprets.  Patient has been experiencing abdominal pain for 1 week.  Pain has been progressively worsening.  Pain is in the right lower quadrant, at times radiates upwards.  No nausea or vomiting.  No diarrhea, has felt constipated.       Home Medications Prior to Admission medications   Medication Sig Start Date End Date Taking? Authorizing Provider  amitriptyline (ELAVIL) 10 MG tablet Take 1 tablet (10 mg total) by mouth at bedtime. 06/28/22   Nestor Ramp, MD  B Complex Vitamins (VITAMIN B COMPLEX) TABS Take 1 tablet by mouth daily. 12/21/21   Amin, Loura Halt, MD  cholecalciferol (VITAMIN D3) 25 MCG (1000 UNIT) tablet Take 2 tablets (2,000 Units total) by mouth daily. 01/21/22   Johney Maine, MD  diclofenac Sodium (VOLTAREN) 1 % GEL APPLY 4 G TOPICALLY 4 (FOUR) TIMES DAILY. APPLY TO RIGHT LATERAL HIP AND THIGH DOWN TO KNEE 06/12/22   Nestor Ramp, MD  gabapentin (NEURONTIN) 100 MG capsule Take 1 capsule (100 mg total) by mouth at bedtime for 14 days, THEN 2 capsules (200 mg total) at bedtime. 05/31/22 07/14/22  Gillermo Murdoch A, DO  lisinopril (ZESTRIL) 5 MG tablet Take 5 mg by mouth daily.    [provider]  Menthol-Methyl Salicylate (MUSCLE RUB) 10-15 % CREA Apply 1 Application topically daily as needed for muscle pain.    [provider]  Multiple Vitamin (MULTIVITAMIN WITH MINERALS) TABS tablet Take 1 tablet by mouth daily. 12/21/21   Dimple Nanas, MD      Allergies    Amlodipine    Review of Systems   Review of Systems  Physical Exam Updated Vital  Signs BP 113/74 (BP Location: Right Arm)   Pulse 99   Temp 99.8 F (37.7 C)   Resp 18   Ht 5\' 7"  (1.702 m)   Wt 80.7 kg   SpO2 97%   BMI 27.86 kg/m  Physical Exam Vitals and nursing note reviewed.  Constitutional:      General: He is not in acute distress.    Appearance: He is well-developed.  HENT:     Head: Normocephalic and atraumatic.     Mouth/Throat:     Mouth: Mucous membranes are moist.  Eyes:     General: Vision grossly intact. Gaze aligned appropriately.     Extraocular Movements: Extraocular movements intact.     Conjunctiva/sclera: Conjunctivae normal.  Cardiovascular:     Rate and Rhythm: Normal rate and regular rhythm.     Pulses: Normal pulses.     Heart sounds: Normal heart sounds, S1 normal and S2 normal. No murmur heard.    No friction rub. No gallop.  Pulmonary:     Effort: Pulmonary effort is normal. No respiratory distress.     Breath sounds: Normal breath sounds.  Abdominal:     Palpations: Abdomen is soft.     Tenderness: There is abdominal tenderness in the right lower quadrant. There is no guarding or rebound.     Hernia: No hernia is present.  Musculoskeletal:  General: No swelling.     Cervical back: Full passive range of motion without pain, normal range of motion and neck supple. No pain with movement, spinous process tenderness or muscular tenderness. Normal range of motion.     Right lower leg: No edema.     Left lower leg: No edema.  Skin:    General: Skin is warm and dry.     Capillary Refill: Capillary refill takes less than 2 seconds.     Findings: No ecchymosis, erythema, lesion or wound.  Neurological:     Mental Status: He is alert and oriented to person, place, and time.     GCS: GCS eye subscore is 4. GCS verbal subscore is 5. GCS motor subscore is 6.     Cranial Nerves: Cranial nerves 2-12 are intact.     Sensory: Sensation is intact.     Motor: Motor function is intact. No weakness or abnormal muscle tone.      Coordination: Coordination is intact.  Psychiatric:        Mood and Affect: Mood normal.        Speech: Speech normal.        Behavior: Behavior normal.     ED Results / Procedures / Treatments   Labs (all labs ordered are listed, but only abnormal results are displayed) Labs Reviewed  COMPREHENSIVE METABOLIC PANEL - Abnormal; Notable for the following components:      Result Value   Sodium 132 (*)    Chloride 97 (*)    Glucose, Bld 101 (*)    Albumin 2.9 (*)    All other components within normal limits  CBC - Abnormal; Notable for the following components:   RBC 3.72 (*)    Hemoglobin 9.9 (*)    HCT 30.1 (*)    RDW 16.1 (*)    All other components within normal limits  URINALYSIS, ROUTINE W REFLEX MICROSCOPIC - Abnormal; Notable for the following components:   Color, Urine AMBER (*)    APPearance HAZY (*)    All other components within normal limits  LIPASE, BLOOD    EKG None  Radiology No results found.  Procedures Procedures  {Document cardiac monitor, telemetry assessment procedure when appropriate:1}  Medications Ordered in ED Medications  morphine (PF) 4 MG/ML injection 4 mg (has no administration in time range)  ondansetron (ZOFRAN) injection 4 mg (has no administration in time range)  sodium chloride 0.9 % bolus 500 mL (has no administration in time range)    ED Course/ Medical Decision Making/ A&P   {   Click here for ABCD2, HEART and other calculatorsREFRESH Note before signing :1}                          Medical Decision Making Amount and/or Complexity of Data Reviewed Radiology: ordered.  Risk Prescription drug management.   ***  {Document critical care time when appropriate:1} {Document review of labs and clinical decision tools ie heart score, Chads2Vasc2 etc:1}  {Document your independent review of radiology images, and any outside records:1} {Document your discussion with family members, caretakers, and with consultants:1} {Document  social determinants of health affecting pt's care:1} {Document your decision making why or why not admission, treatments were needed:1} Final Clinical Impression(s) / ED Diagnoses Final diagnoses:  None    Rx / DC Orders ED Discharge Orders     None

## 2022-07-08 ENCOUNTER — Inpatient Hospital Stay: Payer: 59 | Attending: Hematology

## 2022-07-08 ENCOUNTER — Telehealth: Payer: Self-pay | Admitting: *Deleted

## 2022-07-08 ENCOUNTER — Other Ambulatory Visit: Payer: Self-pay

## 2022-07-08 ENCOUNTER — Inpatient Hospital Stay (HOSPITAL_BASED_OUTPATIENT_CLINIC_OR_DEPARTMENT_OTHER): Payer: 59 | Admitting: Hematology

## 2022-07-08 VITALS — BP 112/72 | HR 80 | Temp 97.2°F | Resp 18 | Ht 67.0 in | Wt 189.0 lb

## 2022-07-08 DIAGNOSIS — R590 Localized enlarged lymph nodes: Secondary | ICD-10-CM | POA: Insufficient documentation

## 2022-07-08 DIAGNOSIS — Z9079 Acquired absence of other genital organ(s): Secondary | ICD-10-CM | POA: Insufficient documentation

## 2022-07-08 DIAGNOSIS — D469 Myelodysplastic syndrome, unspecified: Secondary | ICD-10-CM | POA: Diagnosis not present

## 2022-07-08 DIAGNOSIS — Z8546 Personal history of malignant neoplasm of prostate: Secondary | ICD-10-CM | POA: Insufficient documentation

## 2022-07-08 DIAGNOSIS — Z86711 Personal history of pulmonary embolism: Secondary | ICD-10-CM | POA: Diagnosis not present

## 2022-07-08 DIAGNOSIS — Z79899 Other long term (current) drug therapy: Secondary | ICD-10-CM | POA: Insufficient documentation

## 2022-07-08 DIAGNOSIS — R591 Generalized enlarged lymph nodes: Secondary | ICD-10-CM

## 2022-07-08 LAB — CMP (CANCER CENTER ONLY)
ALT: 22 U/L (ref 0–44)
AST: 23 U/L (ref 15–41)
Albumin: 3.6 g/dL (ref 3.5–5.0)
Alkaline Phosphatase: 94 U/L (ref 38–126)
Anion gap: 10 (ref 5–15)
BUN: 13 mg/dL (ref 8–23)
CO2: 26 mmol/L (ref 22–32)
Calcium: 9.1 mg/dL (ref 8.9–10.3)
Chloride: 98 mmol/L (ref 98–111)
Creatinine: 0.87 mg/dL (ref 0.61–1.24)
GFR, Estimated: >60 mL/min (ref >60)
Glucose, Bld: 116 mg/dL — ABNORMAL HIGH (ref 70–99)
Potassium: 4.1 mmol/L (ref 3.5–5.1)
Sodium: 134 mmol/L — ABNORMAL LOW (ref 135–145)
Total Bilirubin: 0.9 mg/dL (ref 0.3–1.2)
Total Protein: 6.9 g/dL (ref 6.5–8.1)

## 2022-07-08 LAB — CBC WITH DIFFERENTIAL (CANCER CENTER ONLY)
Abs Immature Granulocytes: 0.06 10*3/uL (ref 0.00–0.07)
Basophils Absolute: 0 10*3/uL (ref 0.0–0.1)
Basophils Relative: 0 %
Eosinophils Absolute: 0 10*3/uL (ref 0.0–0.5)
Eosinophils Relative: 0 %
HCT: 30 % — ABNORMAL LOW (ref 39.0–52.0)
Hemoglobin: 10.2 g/dL — ABNORMAL LOW (ref 13.0–17.0)
Immature Granulocytes: 1 %
Lymphocytes Relative: 16 %
Lymphs Abs: 1.6 10*3/uL (ref 0.7–4.0)
MCH: 27.3 pg (ref 26.0–34.0)
MCHC: 34 g/dL (ref 30.0–36.0)
MCV: 80.4 fL (ref 80.0–100.0)
Monocytes Absolute: 2.3 10*3/uL — ABNORMAL HIGH (ref 0.1–1.0)
Monocytes Relative: 23 %
Neutro Abs: 6 10*3/uL (ref 1.7–7.7)
Neutrophils Relative %: 60 %
Platelet Count: 187 10*3/uL (ref 150–400)
RBC: 3.73 MIL/uL — ABNORMAL LOW (ref 4.22–5.81)
RDW: 16 % — ABNORMAL HIGH (ref 11.5–15.5)
WBC Count: 10 10*3/uL (ref 4.0–10.5)
nRBC: 0 % (ref 0.0–0.2)

## 2022-07-08 LAB — MAGNESIUM: Magnesium: 1.6 mg/dL — ABNORMAL LOW (ref 1.7–2.4)

## 2022-07-08 LAB — SAMPLE TO BLOOD BANK

## 2022-07-08 NOTE — Telephone Encounter (Signed)
Daughter (Ms. [name]) called. Father in ED 07/06/22 for abdominal pain and bloating - worse over past week. Daughter states he had several scans with results in chart. Per Ms. [name], ED physician recommended patient see Dr. Candise Che as scheduled on 07/08/22 to review symptoms and scan results. Dr. Candise Che informed of daughter's message. He asked if daughter could be contacted to be at today's appt. Daughter contacted and states she will come if possible and if not, asked to be called during appt. Dr. Candise Che informed.

## 2022-07-08 NOTE — Progress Notes (Signed)
HEMATOLOGY ONCOLOGY CLINIC NOTE  Date of service: 07/08/22    Patient Care Team: Nechama Guard, FNP as PCP - General (Family Medicine)  Diagnosis: Low grade MDS  Current Treatment: Surveillance, B complex  SUMMARY OF ONCOLOGIC HISTORY: Oncology History   No history exists.    HISTORY OF PRESENTING ILLNESS: Tyler [name] is a 72 y.o. male with a past medical history of prostate cancer status post prostatectomy in 2021, history of bilateral pulmonary embolism after his surgery in 2021 presenting today for follow up and management of MDS.  He originally presented to the ED on 11/28/21 for low back pain with radiation down his leg. He was diagnosed with sciatica at urgent care prior to the ED visit. He had a negative xray and CT. He returned to the ED after developing a fever and hematuria. His urologist had stopped his Eliquis 3 months earlier. While in the ED on 12/10/21 he was noted to be febrile with blood work showing HGB of 6.2 and a UTI. His stool was heme-negative. He was hospitalized for treatment of his UTI with secondary sepsis.  Today, he states that he has been doing okay since our last visit. He feels that he is getting stronger.   However, he continues to have low back pain which is worsened with bending over and lying down on his back. His pain radiates into his lateral right leg. He has been walking for half an hour or so but notes that his pain worsens with walking in the cold. He has not been working since I last saw him. His back pain has improved somewhat since he stopped working. He is scheduled to see his PCP next week and plans to discuss referral for orthospine at that time.  He notes some dizziness when he takes some of his medications.  Since his last visit he has completely stopped drinking alcohol and use of Ibuprofen. His appetite has improved. His HGB has continued to improve to 10.1. His platelets have also improved to 74,000. His WBC counts continue to  remain WNL. CMP also improved.  He reports having some enlarged lymph nodes in the left anterior cervical region x1 month. He denies any dental problems. He denies any other lumps or bumps.  He reports a recent episode of chills. He denies any recent vaccines. He denies signs of infection such as sore throat, sinus drainage, cough, or urinary symptoms. He denies fevers. He denies nausea, vomiting, chest pain, dyspnea or cough. He denies any bowel changes. His weight has decreased 6 pounds over last 1.5 months .   INTERVAL HISTORY: Tyler [name] is a 72 y.o. male here for continued evaluation and management of his low grade MDS.   Patient was last seen by me on 03/11/2022 and he complained of headache, but was doing well overall.   Patient is accompanied by her daughter and other family members during this visit. He was admitted to the ED on 07/06/2022 regarding abdominal pain. Patient had CT Chest and CT Abdominal/Pelvis scan yesterday, 07/07/2022, which showed Multiple enlarged lymph nodes in the gastrohepatic ligament, porta hepatis, retroperitoneum, mesentery, and splenic hilum, and pelvic sidewall. A new hypodense lesion is present in the spleen measuring 2.7 cm. Differential diagnosis includes lymphoma or metastatic disease.  Patient notes he has not been doing well since our last visit. During this visit, he complains of nausea, chills, memory problem, mild fever, left abdominal pain, feeling full, and lack of appetite. He notes that all of these symptoms started  around 1 week ago. He notes he is not able to sleep due to abdominal pain.  His daughter notes he started taking Gabapentin 100 mg around one month ago, but discontinued because it was causing the patient dizziness, night terrors, and memory problem. He was then started on Amitriptyline 10 mg, but it was causing similar symptoms.    He complains of enlarged lymph nodes near his groin area and near his left neck. He also complains of  mild occasional night sweats.   He denies abnormal bowel movement, fever, chills, shortness of breath, chest pain, infection issues, back pain or leg swelling. However, he complains of worsening back pain.   Patient rates his left lower abdominal pain as 7-8 out of 10, but he notes occasionally it will be 10 out of 10. He notes that his abdominal pain worsens when he sleeps at night.   He denies traveling outside of [place] and denies being around anyone sick since our last visit.   REVIEW OF SYSTEMS:    10 Point review of systems of done and is negative except as noted above.  Past Medical History:  Diagnosis Date   Cancer [facility])    prostate   Hypertension    Past Surgical History:  Procedure Laterality Date   LYMPHADENECTOMY Bilateral 09/01/2019   Procedure: LYMPHADENECTOMY;  Surgeon: Sebastian Ache, MD;  Location: [facility];  Service: Urology;  Laterality: Bilateral;   ROBOT ASSISTED LAPAROSCOPIC RADICAL PROSTATECTOMY N/A 09/01/2019   Procedure: XI ROBOTIC ASSISTED LAPAROSCOPIC RADICAL PROSTATECTOMY;  Surgeon: Sebastian Ache, MD;  Location: [facility];  Service: Urology;  Laterality: N/A;  3 HRS   Social History   Tobacco Use   Smoking status: Never   Smokeless tobacco: Never  Vaping Use   Vaping Use: Never used  Substance Use Topics   Alcohol use: Yes    Comment: at least one 40 oz beer daily   Drug use: Never    ALLERGIES:  is allergic to amlodipine.  MEDICATIONS:  Current Outpatient Medications  Medication Sig Dispense Refill   B Complex Vitamins (VITAMIN B COMPLEX) TABS Take 1 tablet by mouth daily. 30 tablet 0   baclofen (LIORESAL) 10 MG tablet Take 10 mg by mouth 3 (three) times daily.     cholecalciferol (VITAMIN D3) 25 MCG (1000 UNIT) tablet Take 2 tablets (2,000 Units total) by mouth daily.     clotrimazole-betamethasone (LOTRISONE) cream Apply 1 Application topically 2 (two) times daily. 45 g 0   folic acid (FOLVITE) 1 MG tablet Take 1 tablet (1 mg total)  by mouth daily. 30 tablet 0   lisinopril (ZESTRIL) 5 MG tablet Take 5 mg by mouth daily.     Menthol-Methyl Salicylate (MUSCLE RUB) 10-15 % CREA Apply 1 Application topically daily as needed for muscle pain.     metoprolol tartrate (LOPRESSOR) 25 MG tablet Take 0.5 tablets (12.5 mg total) by mouth 2 (two) times daily. 60 tablet 0   Multiple Vitamin (MULTIVITAMIN WITH MINERALS) TABS tablet Take 1 tablet by mouth daily.     ondansetron (ZOFRAN-ODT) 4 MG disintegrating tablet 4mg  ODT q4 hours prn nausea/vomit 20 tablet 0   pantoprazole (PROTONIX) 40 MG tablet Take 1 tablet (40 mg total) by mouth daily before breakfast. 30 tablet 0   polyethylene glycol powder (GLYCOLAX/MIRALAX) 17 GM/SCOOP powder Take 17 g by mouth daily as needed for moderate constipation. 238 g 0   senna-docusate (SENOKOT-S) 8.6-50 MG tablet Take 2 tablets by mouth at bedtime as needed for mild  constipation. 60 tablet 0   thiamine (VITAMIN B1) 100 MG tablet Take 1 tablet (100 mg total) by mouth daily. 30 tablet 0   No current facility-administered medications for this visit.    PHYSICAL EXAMINATION: ECOG PERFORMANCE STATUS: 1 - Symptomatic but completely ambulatory  . Vitals:   03/11/22 1231  BP: (!) 142/83  Pulse: 69  Resp: 18  Temp: 97.7 F (36.5 C)  SpO2: 100%     Filed Weights   03/11/22 1231  Weight: 194 lb 12.8 oz (88.4 kg)    .Body mass index is 30.51 kg/m.  GENERAL:alert, in no acute distress and comfortable SKIN: no acute rashes, no significant lesions EYES: conjunctiva are pink and non-injected, sclera anicteric OROPHARYNX: MMM, no exudates, no oropharyngeal erythema or ulceration. Right lower dental cavity.  NECK: supple, no JVD LYMPH:  palpable lymphadenopathy in the right anterior cervical region.  No axillary or inguinal lymphadenopathy LUNGS: clear to auscultation b/l with normal respiratory effort HEART: regular rate & rhythm ABDOMEN:  normoactive bowel sounds , non tender, not  distended. Extremity: no pedal edema PSYCH: alert & oriented x 3 with fluent speech NEURO: no focal motor/sensory deficits  LABORATORY DATA:   I have reviewed the data as listed .    Latest Ref Rng & Units 07/08/2022   12:30 PM 07/06/2022    9:39 PM 05/23/2022    2:51 PM  CBC  WBC 4.0 - 10.5 K/uL 10.0  10.0  6.6   Hemoglobin 13.0 - 17.0 g/dL 30.8  9.9  65.7   Hematocrit 39.0 - 52.0 % 30.0  30.1  45.1   Platelets 150 - 400 K/uL 187  208  291    .    Latest Ref Rng & Units 07/08/2022   12:30 PM 07/06/2022    9:39 PM 05/23/2022    2:51 PM  CMP  Glucose 70 - 99 mg/dL 846  962  952   BUN 8 - 23 mg/dL 13  11  9    Creatinine 0.61 - 1.24 mg/dL 8.41  3.24  4.01   Sodium 135 - 145 mmol/L 134  132  138   Potassium 3.5 - 5.1 mmol/L 4.1  4.0  4.0   Chloride 98 - 111 mmol/L 98  97  99   CO2 22 - 32 mmol/L 26  22  24    Calcium 8.9 - 10.3 mg/dL 9.1  8.9  9.4   Total Protein 6.5 - 8.1 g/dL 6.9  6.7  7.6   Total Bilirubin 0.3 - 1.2 mg/dL 0.9  1.0  0.7   Alkaline Phos 38 - 126 U/L 94  97  111   AST 15 - 41 U/L 23  25  44   ALT 0 - 44 U/L 22  24  61     RADIOGRAPHIC STUDIES: I have personally reviewed the radiological images as listed and agreed with the findings in the report. No results found.  ASSESSMENT & PLAN:   72 yo with  Low grade MDS  2. Generalized lypmhadenopathy PLAN: -Discussed lab results from today, 07/08/2022, with the patient and his family. CBC shows decreased hemoglobin at 10.2 g/dL and decreased hematocrit at 30.0%. CMP shows elevated Glucose level at 116 mg/dL. -Discussed the CT Abdominal/Pelvis results from 07/07/2022 with the patient. Showed  Multiple enlarged lymph nodes in the mediastinum, bilateral axilla, subclavian regions bilaterally, and upper abdomen, suspicious for lymphoma or metastatic disease. -Discussed CT chest results from 07/07/2022 which showed  Multiple enlarged lymph nodes in the  gastrohepatic ligament, porta hepatis, retroperitoneum, mesentery,  and splenic hilum, and pelvic sidewall. A new hypodense lesion is present in the spleen measuring 2.7 cm. Differential diagnosis includes lymphoma or metastatic disease. -Discussed the next step is for ultrasound guided core needle biopsy.  -Plan ultrasound guided core needle biopsy ASAP.  -Plan on extra lab work-up soon.  -Plan on PET scan ASAP.  -Will prescribe opiate for pain management.  -Will prescribe pain medication for abdominal pain.     FOLLOW-UP: -US guided core needle biopsy of left axillary LN ASAP (concern for high grade lymphoma) -labs on day of LN bx -PET/CT in 5 days RTC with Dr Candise Che in 10 days   The total time spent in the appointment was 31 minutes* .  All of the patient's questions were answered with apparent satisfaction. The patient knows to call the clinic with any problems, questions or concerns.   Wyvonnia Lora MD MS AAHIVMS [facility] Hematology/Oncology Physician [facility]  .*Total Encounter Time as defined by the Centers for Medicare and Medicaid Services includes, in addition to the face-to-face time of a patient visit (documented in the note above) non-face-to-face time: obtaining and reviewing outside history, ordering and reviewing medications, tests or procedures, care coordination (communications with other health care professionals or caregivers) and documentation in the medical record.   I, Ok Edwards, am acting as a Neurosurgeon for Wyvonnia Lora, MD.  .I have reviewed the above documentation for accuracy and completeness, and I agree with the above. Johney Maine MD

## 2022-07-11 MED ORDER — SENNOSIDES-DOCUSATE SODIUM 8.6-50 MG PO TABS: 2.0000 | ORAL_TABLET | Freq: Every evening | ORAL | 1 refills | Status: DC | PRN

## 2022-07-11 MED ORDER — OXYCODONE HCL 5 MG PO TABS: 5.0000 mg | ORAL_TABLET | Freq: Four times a day (QID) | ORAL | 0 refills | Status: DC | PRN

## 2022-07-12 ENCOUNTER — Encounter: Payer: Self-pay | Admitting: General Practice

## 2022-07-12 NOTE — Progress Notes (Signed)
Oley Balm, MD  Caroleen Hamman, NT PROCEDURE / BIOPSY REVIEW Date: 07/12/22  Requested Biopsy site: axillary LAN (bilat) Reason for request: r/o lymphoma Imaging review: Best seen on CT 07/07/22 [id number] Se 3  Decision: Approved Imaging modality to perform: CT Schedule with: Moderate Sedation Schedule for: Any VIR  Additional comments:   Please contact me with questions, concerns, or if issue pertaining to this request arise.  Dayne Oley Balm, MD [facility]  '       Previous Messages    ----- Message ----- From: Caroleen Hamman, NT Sent: 07/12/2022  11:04 AM EDT To: Ir Procedure Requests Subject: [place] CORE BIOPSY (LYMPH NODES)                  Procedure: [place] CORE BIOPSY (LYMPH NODES)  Reason: Patient with significant new Lymphadenopathy likely lymphoma for US guided core needle Bx of axillary LN - flowcytometry, molecular studies as indicated Dx: Lymphadenopathy, generalized  History: CT in chart  Provider: Johney Maine, MD  Contact: [phone]

## 2022-07-15 ENCOUNTER — Emergency Department (HOSPITAL_COMMUNITY): Payer: 59

## 2022-07-15 ENCOUNTER — Encounter (HOSPITAL_COMMUNITY): Payer: Self-pay

## 2022-07-15 ENCOUNTER — Emergency Department (HOSPITAL_COMMUNITY)
Admission: EM | Admit: 2022-07-15 | Discharge: 2022-07-15 | Disposition: A | Discharge status: Home / Self Care | Payer: 59 | Attending: Emergency Medicine | Admitting: Emergency Medicine

## 2022-07-15 ENCOUNTER — Other Ambulatory Visit: Payer: Self-pay

## 2022-07-15 ENCOUNTER — Other Ambulatory Visit: Payer: Self-pay | Admitting: Student

## 2022-07-15 DIAGNOSIS — N3289 Other specified disorders of bladder: Secondary | ICD-10-CM | POA: Diagnosis not present

## 2022-07-15 DIAGNOSIS — R000 Tachycardia, unspecified: Secondary | ICD-10-CM | POA: Insufficient documentation

## 2022-07-15 DIAGNOSIS — Z419 Encounter for procedure for purposes other than remedying health state, unspecified: Secondary | ICD-10-CM

## 2022-07-15 DIAGNOSIS — R1084 Generalized abdominal pain: Secondary | ICD-10-CM | POA: Diagnosis not present

## 2022-07-15 DIAGNOSIS — Z743 Need for continuous supervision: Secondary | ICD-10-CM | POA: Diagnosis not present

## 2022-07-15 DIAGNOSIS — R109 Unspecified abdominal pain: Secondary | ICD-10-CM | POA: Diagnosis not present

## 2022-07-15 LAB — LIPASE, BLOOD: Lipase: 20 U/L (ref 11–51)

## 2022-07-15 LAB — COMPREHENSIVE METABOLIC PANEL
ALT: 32 U/L (ref 0–44)
AST: 42 U/L — ABNORMAL HIGH (ref 15–41)
Albumin: 2.6 g/dL — ABNORMAL LOW (ref 3.5–5.0)
Alkaline Phosphatase: 95 U/L (ref 38–126)
Anion gap: 13 (ref 5–15)
BUN: 14 mg/dL (ref 8–23)
CO2: 23 mmol/L (ref 22–32)
Calcium: 8.6 mg/dL — ABNORMAL LOW (ref 8.9–10.3)
Chloride: 95 mmol/L — ABNORMAL LOW (ref 98–111)
Creatinine, Ser: 1 mg/dL (ref 0.61–1.24)
GFR, Estimated: >60 mL/min (ref >60)
Glucose, Bld: 115 mg/dL — ABNORMAL HIGH (ref 70–99)
Potassium: 4.1 mmol/L (ref 3.5–5.1)
Sodium: 131 mmol/L — ABNORMAL LOW (ref 135–145)
Total Bilirubin: 1.2 mg/dL (ref 0.3–1.2)
Total Protein: 6.2 g/dL — ABNORMAL LOW (ref 6.5–8.1)

## 2022-07-15 LAB — CBC WITH DIFFERENTIAL/PLATELET
Abs Immature Granulocytes: 0 10*3/uL (ref 0.00–0.07)
Basophils Absolute: 0 10*3/uL (ref 0.0–0.1)
Basophils Relative: 0 %
Eosinophils Absolute: 0.2 10*3/uL (ref 0.0–0.5)
Eosinophils Relative: 2 %
HCT: 27 % — ABNORMAL LOW (ref 39.0–52.0)
Hemoglobin: 8.8 g/dL — ABNORMAL LOW (ref 13.0–17.0)
Lymphocytes Relative: 6 %
Lymphs Abs: 0.6 10*3/uL — ABNORMAL LOW (ref 0.7–4.0)
MCH: 26.2 pg (ref 26.0–34.0)
MCHC: 32.6 g/dL (ref 30.0–36.0)
MCV: 80.4 fL (ref 80.0–100.0)
Monocytes Absolute: 0.8 10*3/uL (ref 0.1–1.0)
Monocytes Relative: 8 %
Neutro Abs: 8.7 10*3/uL — ABNORMAL HIGH (ref 1.7–7.7)
Neutrophils Relative %: 84 %
Platelets: 196 10*3/uL (ref 150–400)
RBC: 3.36 MIL/uL — ABNORMAL LOW (ref 4.22–5.81)
RDW: 16.7 % — ABNORMAL HIGH (ref 11.5–15.5)
WBC: 10.3 10*3/uL (ref 4.0–10.5)
nRBC: 0 /100 WBC
nRBC: 0.2 % (ref 0.0–0.2)

## 2022-07-15 MED ORDER — ONDANSETRON HCL 4 MG/2ML IJ SOLN
4.0000 mg | Freq: Once | INTRAMUSCULAR | Status: AC
  Administered 2022-07-15: 4 mg via INTRAVENOUS
  Filled 2022-07-15: qty 2

## 2022-07-15 MED ORDER — IOHEXOL 350 MG/ML SOLN
75.0000 mL | Freq: Once | INTRAVENOUS | Status: AC | PRN
  Administered 2022-07-15: 75 mL via INTRAVENOUS

## 2022-07-15 MED ORDER — LACTATED RINGERS IV BOLUS
1000.0000 mL | Freq: Once | INTRAVENOUS | Status: AC
  Administered 2022-07-15: 1000 mL via INTRAVENOUS

## 2022-07-15 MED ORDER — METOCLOPRAMIDE HCL 10 MG PO TABS: 10.0000 mg | ORAL_TABLET | Freq: Four times a day (QID) | ORAL | 0 refills | Status: DC | PRN

## 2022-07-15 MED ORDER — FENTANYL CITRATE PF 50 MCG/ML IJ SOSY
50.0000 ug | PREFILLED_SYRINGE | Freq: Once | INTRAMUSCULAR | Status: AC
  Administered 2022-07-15: 50 ug via INTRAVENOUS
  Filled 2022-07-15: qty 1

## 2022-07-15 NOTE — Discharge Instructions (Signed)
It is very important that you return tomorrow for your lymph node biopsy.  Return here for concerning changes in your condition.

## 2022-07-15 NOTE — ED Provider Notes (Signed)
[name]   CSN: [id number] Arrival date & time: 07/15/22  5621     History  Chief Complaint  Patient presents with   Abdominal Pain    Pt coming from home with abdominal pain, n/v, decreased appetite. Has been experiencing pain for approx 6 weeks    Tyler [name] is a 72 y.o. male.  HPI Patient presents with nausea, vomiting, right-sided abdominal pain.  He arrives via EMS.  History is obtained by the patient and those individuals as well as chart review.  Patient has had right-sided pain for some time, but worse over the past few days with new nausea, vomiting, p.o. intolerance.  Pain is generally right-sided, no left-sided pain, no chest pain, no headache. He notes that after attempting to eat he has vomiting though he has been intolerant of only minimal amounts of p.o. intake, he now has essentially 0 intake over the past 2 days. History is notable for ongoing evaluation for lymphadenopathy with planned lymph node biopsy tomorrow.  EMS reports no hemodynamic instability en route.    Home Medications Prior to Admission medications   Medication Sig Start Date End Date Taking? Authorizing Provider  metoCLOPramide (REGLAN) 10 MG tablet Take 1 tablet (10 mg total) by mouth every 6 (six) hours as needed for nausea. 07/15/22  Yes Gerhard Munch, MD  amitriptyline (ELAVIL) 10 MG tablet Take 1 tablet (10 mg total) by mouth at bedtime. Patient not taking: Reported on 07/08/2022 06/28/22   Nestor Ramp, MD  B Complex Vitamins (VITAMIN B COMPLEX) TABS Take 1 tablet by mouth daily. 12/21/21   Amin, Loura Halt, MD  cholecalciferol (VITAMIN D3) 25 MCG (1000 UNIT) tablet Take 2 tablets (2,000 Units total) by mouth daily. 01/21/22   Johney Maine, MD  diclofenac Sodium (VOLTAREN) 1 % GEL APPLY 4 G TOPICALLY 4 (FOUR) TIMES DAILY. APPLY TO RIGHT LATERAL HIP AND THIGH DOWN TO KNEE 06/12/22   Nestor Ramp, MD  gabapentin (NEURONTIN)  100 MG capsule Take 1 capsule (100 mg total) by mouth at bedtime for 14 days, THEN 2 capsules (200 mg total) at bedtime. Patient not taking: Reported on 07/08/2022 05/31/22 07/14/22  Gillermo Murdoch A, DO  lisinopril (ZESTRIL) 5 MG tablet Take 5 mg by mouth daily.    [provider]  Menthol-Methyl Salicylate (MUSCLE RUB) 10-15 % CREA Apply 1 Application topically daily as needed for muscle pain.    [provider]  Multiple Vitamin (MULTIVITAMIN WITH MINERALS) TABS tablet Take 1 tablet by mouth daily. 12/21/21   Amin, Loura Halt, MD  oxyCODONE (OXY IR/ROXICODONE) 5 MG immediate release tablet Take 1 tablet (5 mg total) by mouth every 6 (six) hours as needed for severe pain. 07/11/22   Johney Maine, MD  senna-docusate (SENNA S) 8.6-50 MG tablet Take 2 tablets by mouth at bedtime as needed for mild constipation. 07/11/22   Johney Maine, MD      Allergies    Amitriptyline, Amlodipine, and Neurontin [gabapentin]    Review of Systems   Review of Systems  All other systems reviewed and are negative.   Physical Exam Updated Vital Signs BP 110/67   Pulse 98   Temp 98.5 F (36.9 C) (Oral)   Resp 20   Ht 5\' 7"  (1.702 m)   Wt 85.7 kg   SpO2 99%   BMI 29.60 kg/m  Physical Exam Vitals and nursing note reviewed.  Constitutional:  General: He is not in acute distress.    Appearance: He is well-developed.  HENT:     Head: Normocephalic and atraumatic.  Eyes:     Conjunctiva/sclera: Conjunctivae normal.  Cardiovascular:     Rate and Rhythm: Regular rhythm. Tachycardia present.  Pulmonary:     Effort: Pulmonary effort is normal. No respiratory distress.     Breath sounds: No stridor.  Abdominal:     General: There is no distension.     Tenderness: There is abdominal tenderness in the right upper quadrant, right lower quadrant and periumbilical area.  Skin:    General: Skin is warm and dry.  Neurological:     Mental Status: He is alert and oriented  to person, place, and time.     ED Results / Procedures / Treatments   Labs (all labs ordered are listed, but only abnormal results are displayed) Labs Reviewed  COMPREHENSIVE METABOLIC PANEL - Abnormal; Notable for the following components:      Result Value   Sodium 131 (*)    Chloride 95 (*)    Glucose, Bld 115 (*)    Calcium 8.6 (*)    Total Protein 6.2 (*)    Albumin 2.6 (*)    AST 42 (*)    All other components within normal limits  CBC WITH DIFFERENTIAL/PLATELET - Abnormal; Notable for the following components:   RBC 3.36 (*)    Hemoglobin 8.8 (*)    HCT 27.0 (*)    RDW 16.7 (*)    Neutro Abs 8.7 (*)    Lymphs Abs 0.6 (*)    All other components within normal limits  LIPASE, BLOOD    EKG None  Radiology CT ABDOMEN PELVIS W CONTRAST  Result Date: 07/15/2022 CLINICAL DATA:  Possible lymphoma. Bowel obstruction suspected. Right lower quadrant and right upper quadrant abdominal pain for 6 weeks. Nausea and vomiting. Scheduled for a lymph node biopsy tomorrow. EXAM: CT ABDOMEN AND PELVIS WITH CONTRAST TECHNIQUE: Multidetector CT imaging of the abdomen and pelvis was performed using the standard protocol following bolus administration of intravenous contrast. RADIATION DOSE REDUCTION: This exam was performed according to the departmental dose-optimization program which includes automated exposure control, adjustment of the mA and/or kV according to patient size and/or use of iterative reconstruction technique. CONTRAST:  75mL OMNIPAQUE IOHEXOL 350 MG/ML SOLN COMPARISON:  CT abdomen and pelvis 06/29/2022 FINDINGS: Lower chest: Mild curvilinear subsegmental atelectasis and/or scarring within the bilateral lung bases is similar to prior. Heart size is at the upper limits of normal. Coronary artery calcifications are noted. Hepatobiliary: Smooth liver contours. There are again punctate subcentimeter low-density lesions within the right hepatic lobe (axial series 3, image 25) and the  left hepatic lobe (axial image 23) that are again too small to further characterize but not significantly changed from recent prior. The gallbladder is unremarkable. No intrahepatic or extrahepatic biliary ductal dilatation. Pancreas: Unremarkable. No pancreatic ductal dilatation or surrounding inflammatory changes. Spleen: The spleen measures 11 cm in length, within normal limits. There is a 2.9 x 2.6 x 2.6 cm low-density lesion within the medial aspect of the spleen that is unchanged from 07/07/2022 recent CT but new from 12/10/2021. Adrenals/Urinary Tract: Normal bilateral adrenals. The kidneys enhance uniformly and are symmetric in size. There are again bilateral fluid attenuation renal cysts, measuring up to 3.8 cm on the right and 2.8 cm on the left. No follow-up imaging is recommended. The urinary bladder is decompressed, limiting evaluation. Stomach/Bowel: Moderate amount of stool throughout  the colon. Normal appearance of the terminal ileum. Normal appendix (axial series 3, image 60) no dilated loops of bowel are seen to indicate bowel obstruction. Vascular/Lymphatic: No abdominal aortic aneurysm. The major intra-abdominal aortic branch vessels are patent. No significant change in mesenteric, porta hepatis, retroperitoneal, and pelvic lymphadenopathy compared to 07/07/2022, again new from 12/10/2021. Reference gastrohepatic ligament 2.0 cm short axis lymph node (axial series 2, image 21), lymph node at the anterior aspect of the splenic hilum measuring up to 2.0 cm in short axis (axial image 25), and conglomeration of aortocaval and para-aortic lymph nodes with a focal conglomeration measuring up to 2.8 cm in short axis (axial image 42), not significantly changed from 07/07/2022. Reproductive: The prostate again appears to be surgically absent. Other: No ventral abdominal hernia is seen. No free air or free fluid is seen within the abdomen or pelvis. Musculoskeletal: Chronic inferior L4 endplate mild  concavity degenerative Schmorl's node is similar to prior. Anterior bridging osteophytes are again seen throughout the visualized inferior thoracic spine. There is complete anterior osseous fusion of the left sacroiliac joint. Moderate anterior right sacroiliac joint degenerative osteophytes. IMPRESSION: Compared to 07/07/2022: 1. No bowel obstruction.  Normal appendix. 2. No significant change in mesenteric, porta hepatis, retroperitoneal, and pelvic lymphadenopathy compared to 07/07/2022, again new from 12/10/2021. 3. No significant change in 2.9 cm low-density lesion within the medial aspect of the spleen compared to 07/07/2022 but new from 12/10/2021. 4. Constellation of findings are again concerning for lymphoma versus metastatic disease. Electronically Signed   By: Neita Garnet M.D.   On: 07/15/2022 11:59    Procedures Procedures    Medications Ordered in ED Medications  lactated ringers bolus 1,000 mL (0 mLs Intravenous Stopped 07/15/22 1107)  fentaNYL (SUBLIMAZE) injection 50 mcg (50 mcg Intravenous Given 07/15/22 0911)  ondansetron (ZOFRAN) injection 4 mg (4 mg Intravenous Given 07/15/22 0911)  iohexol (OMNIPAQUE) 350 MG/ML injection 75 mL (75 mLs Intravenous Contrast Given 07/15/22 1120)    ED Course/ Medical Decision Making/ A&P                             Medical Decision Making Patient with known lymphadenopathy, being evaluated for possible lymphoma presents with right-sided abdominal pain, nausea, vomiting.  Differential includes mass effect, bowel obstruction, infection, dehydration, electrolyte abnormalities. Patient received fluid resuscitation, IV fentanyl, Zofran, CT scan, labs ordered. Cardiac monitor 110 sinus tach abnormal Pulse ox 100% room air normal   Amount and/or Complexity of Data Reviewed Independent Historian: EMS External Data Reviewed: notes. Labs: ordered. Decision-making details documented in ED Course. Radiology: ordered and independent interpretation  performed. Decision-making details documented in ED Course.  Risk Prescription drug management. Decision regarding hospitalization.   1:32 PM Patient awake, alert, speaking clearly.  I reviewed the findings, including CT imaging, discussed them with the patient, his wife, and the daughter via telephone.  Findings consistent with those of last week aside from mild drop in hemoglobin value.  On repeat exam the patient's heart rate is now less than 100, he is calm, in no distress.  We discussed the possibilities including discomfort secondary to his lymphatic enlargement, or other processes. Patient is scheduled for lymph node biopsy tomorrow, and is important that he attend that appointment for further differentiation of his discomfort. Absent hemodynamic instability, with labs, CT as above, after lengthy conversation with the patient's daughter, wife, patient discharged to return tomorrow for lymph node biopsy.  He will start  new antinausea medication regimen.  He notes that he has pain medication at home.         Final Clinical Impression(s) / ED Diagnoses Final diagnoses:  Generalized abdominal pain    Rx / DC Orders ED Discharge Orders          Ordered    metoCLOPramide (REGLAN) 10 MG tablet  Every 6 hours PRN        07/15/22 1332              Gerhard Munch, MD 07/15/22 1332

## 2022-07-15 NOTE — ED Triage Notes (Signed)
Pt is coming from home with right lower, upper abdominal pain that has been going on for 6 weeks. Pt is due for a lymph biopsy tomorrow. Pt also complains of n/v. Pt denies cp/shob/diarrhea. Pt is a&ox4.

## 2022-07-16 ENCOUNTER — Ambulatory Visit (HOSPITAL_COMMUNITY)
Admission: RE | Admit: 2022-07-16 | Discharge: 2022-07-16 | Disposition: A | Discharge status: Home / Self Care | Payer: 59 | Source: Ambulatory Visit | Attending: Hematology | Admitting: Hematology

## 2022-07-16 ENCOUNTER — Encounter (HOSPITAL_COMMUNITY): Payer: Self-pay

## 2022-07-16 ENCOUNTER — Ambulatory Visit (HOSPITAL_COMMUNITY): Admission: RE | Admit: 2022-07-16 | Payer: 59 | Source: Ambulatory Visit

## 2022-07-16 ENCOUNTER — Emergency Department (HOSPITAL_COMMUNITY): Admission: EM | Admit: 2022-07-16 | Payer: 59 | Source: Home / Self Care

## 2022-07-16 DIAGNOSIS — Z8546 Personal history of malignant neoplasm of prostate: Secondary | ICD-10-CM | POA: Diagnosis not present

## 2022-07-16 DIAGNOSIS — R590 Localized enlarged lymph nodes: Secondary | ICD-10-CM | POA: Diagnosis not present

## 2022-07-16 DIAGNOSIS — C8514 Unspecified B-cell lymphoma, lymph nodes of axilla and upper limb: Secondary | ICD-10-CM | POA: Diagnosis not present

## 2022-07-16 DIAGNOSIS — C8334 Diffuse large B-cell lymphoma, lymph nodes of axilla and upper limb: Secondary | ICD-10-CM | POA: Diagnosis not present

## 2022-07-16 DIAGNOSIS — R591 Generalized enlarged lymph nodes: Secondary | ICD-10-CM | POA: Diagnosis not present

## 2022-07-16 DIAGNOSIS — Z419 Encounter for procedure for purposes other than remedying health state, unspecified: Secondary | ICD-10-CM

## 2022-07-16 MED ORDER — SODIUM CHLORIDE 0.9 % IV SOLN: INTRAVENOUS | Status: DC

## 2022-07-16 MED ORDER — LIDOCAINE HCL (PF) 1 % IJ SOLN
8.0000 mL | Freq: Once | INTRAMUSCULAR | Status: AC
  Administered 2022-07-16: 8 mL via INTRADERMAL

## 2022-07-16 NOTE — H&P (Signed)
Chief Complaint: Patient was seen in consultation today for Left axillary Lymph node biopsy at the request of Johney Maine  Referring Physician(s): Johney Maine  Supervising Physician: Gilmer Mor  Patient Status: [facility] - Out-pt  History of Present Illness: Tyler [name] is a 72 y.o. male   FULL Code status per pt and Interpreter at bedside  Hx Prostate Ca 2021 Myelodysplastic syndrome- low grade per Dr Candise Che Developed abd pain and was seen by MD  CT 5/12: IMPRESSION: 1. Multiple enlarged lymph nodes in the mediastinum, bilateral axilla, subclavian regions bilaterally, and upper abdomen, suspicious for lymphoma or metastatic disease. 2. Coronary artery calcification.  CT Abd yesterday:  IMPRESSION: Compared to 07/07/2022:   1. No bowel obstruction.  Normal appendix. 2. No significant change in mesenteric, porta hepatis, retroperitoneal, and pelvic lymphadenopathy compared to 07/07/2022, again new from 12/10/2021. 3. No significant change in 2.9 cm low-density lesion within the medial aspect of the spleen compared to 07/07/2022 but new from 12/10/2021. 4. Constellation of findings are again concerning for lymphoma versus metastatic disease.  Scheduled now for Left axillary Lymph node    Past Medical History:  Diagnosis Date   Cancer [facility])    prostate   Hypertension     Past Surgical History:  Procedure Laterality Date   LYMPHADENECTOMY Bilateral 09/01/2019   Procedure: LYMPHADENECTOMY;  Surgeon: Sebastian Ache, MD;  Location: [facility];  Service: Urology;  Laterality: Bilateral;   ROBOT ASSISTED LAPAROSCOPIC RADICAL PROSTATECTOMY N/A 09/01/2019   Procedure: XI ROBOTIC ASSISTED LAPAROSCOPIC RADICAL PROSTATECTOMY;  Surgeon: Sebastian Ache, MD;  Location: [facility];  Service: Urology;  Laterality: N/A;  3 HRS    Allergies: Amitriptyline, Amlodipine, and Neurontin [gabapentin]  Medications: Prior to Admission medications   Medication Sig Start  Date End Date Taking? Authorizing Provider  B Complex Vitamins (VITAMIN B COMPLEX) TABS Take 1 tablet by mouth daily. 12/21/21  Yes Amin, Loura Halt, MD  cholecalciferol (VITAMIN D3) 25 MCG (1000 UNIT) tablet Take 2 tablets (2,000 Units total) by mouth daily. 01/21/22  Yes Johney Maine, MD  diclofenac Sodium (VOLTAREN) 1 % GEL APPLY 4 G TOPICALLY 4 (FOUR) TIMES DAILY. APPLY TO RIGHT LATERAL HIP AND THIGH DOWN TO KNEE 06/12/22  Yes Nestor Ramp, MD  lisinopril (ZESTRIL) 5 MG tablet Take 5 mg by mouth daily.   Yes [provider]  metoCLOPramide (REGLAN) 10 MG tablet Take 1 tablet (10 mg total) by mouth every 6 (six) hours as needed for nausea. 07/15/22  Yes Gerhard Munch, MD  oxyCODONE (OXY IR/ROXICODONE) 5 MG immediate release tablet Take 1 tablet (5 mg total) by mouth every 6 (six) hours as needed for severe pain. 07/11/22  Yes Johney Maine, MD  amitriptyline (ELAVIL) 10 MG tablet Take 1 tablet (10 mg total) by mouth at bedtime. 06/28/22   Nestor Ramp, MD  gabapentin (NEURONTIN) 100 MG capsule Take 1 capsule (100 mg total) by mouth at bedtime for 14 days, THEN 2 capsules (200 mg total) at bedtime. Patient not taking: Reported on 07/08/2022 05/31/22 07/14/22  Gillermo Murdoch A, DO  Menthol-Methyl Salicylate (MUSCLE RUB) 10-15 % CREA Apply 1 Application topically daily as needed for muscle pain.    [provider]  Multiple Vitamin (MULTIVITAMIN WITH MINERALS) TABS tablet Take 1 tablet by mouth daily. 12/21/21   Amin, Loura Halt, MD  senna-docusate (SENNA S) 8.6-50 MG tablet Take 2 tablets by mouth at bedtime as needed for mild constipation. 07/11/22   Johney Maine, MD  Family History  Problem Relation Age of Onset   Stroke Father     Social History   Socioeconomic History   Marital status: Married    Spouse name: Not on file   Number of children: Not on file   Years of education: Not on file   Highest education level: Not on file  Occupational  History   Not on file  Tobacco Use   Smoking status: Never   Smokeless tobacco: Never  Vaping Use   Vaping Use: Never used  Substance and Sexual Activity   Alcohol use: Yes    Comment: at least one 40 oz beer daily   Drug use: Never   Sexual activity: Not on file  Other Topics Concern   Not on file  Social History Narrative   Not on file   Social Determinants of Health   Financial Resource Strain: Not on file  Food Insecurity: No Food Insecurity (12/18/2021)   Hunger Vital Sign    Worried About Running Out of Food in the Last Year: Never true    Ran Out of Food in the Last Year: Never true  Transportation Needs: No Transportation Needs (12/18/2021)   PRAPARE - Administrator, Civil Service (Medical): No    Lack of Transportation (Non-Medical): No  Physical Activity: Not on file  Stress: Not on file  Social Connections: Not on file    Review of Systems: A 12 point ROS discussed and pertinent positives are indicated in the HPI above.  All other systems are negative.  Review of Systems  Constitutional:  Negative for activity change, fatigue and fever.  Respiratory:  Negative for cough and shortness of breath.   Cardiovascular:  Negative for chest pain.  Gastrointestinal:  Positive for abdominal pain.  Neurological:  Negative for weakness.  Psychiatric/Behavioral:  Negative for behavioral problems and confusion.     Vital Signs: BP 114/66   Pulse 95   Temp 99.5 F (37.5 C) (Oral)   Resp 18   Ht 5\' 7"  (1.702 m)   Wt 189 lb (85.7 kg)   SpO2 97%   BMI 29.60 kg/m   Advance Care Plan: The advanced care plan/surrogate decision maker was discussed at the time of visit and documented in the medical record.    Physical Exam Vitals reviewed.  HENT:     Mouth/Throat:     Mouth: Mucous membranes are moist.  Cardiovascular:     Rate and Rhythm: Normal rate and regular rhythm.     Heart sounds: Normal heart sounds.  Pulmonary:     Effort: Pulmonary effort  is normal.     Breath sounds: Normal breath sounds.  Abdominal:     Palpations: Abdomen is soft.  Musculoskeletal:        General: Normal range of motion.  Skin:    General: Skin is warm.  Neurological:     Mental Status: He is alert and oriented to person, place, and time.  Psychiatric:        Behavior: Behavior normal.     Imaging: CT ABDOMEN PELVIS W CONTRAST  Result Date: 07/15/2022 CLINICAL DATA:  Possible lymphoma. Bowel obstruction suspected. Right lower quadrant and right upper quadrant abdominal pain for 6 weeks. Nausea and vomiting. Scheduled for a lymph node biopsy tomorrow. EXAM: CT ABDOMEN AND PELVIS WITH CONTRAST TECHNIQUE: Multidetector CT imaging of the abdomen and pelvis was performed using the standard protocol following bolus administration of intravenous contrast. RADIATION DOSE REDUCTION: This exam was performed  according to the departmental dose-optimization program which includes automated exposure control, adjustment of the mA and/or kV according to patient size and/or use of iterative reconstruction technique. CONTRAST:  75mL OMNIPAQUE IOHEXOL 350 MG/ML SOLN COMPARISON:  CT abdomen and pelvis 06/29/2022 FINDINGS: Lower chest: Mild curvilinear subsegmental atelectasis and/or scarring within the bilateral lung bases is similar to prior. Heart size is at the upper limits of normal. Coronary artery calcifications are noted. Hepatobiliary: Smooth liver contours. There are again punctate subcentimeter low-density lesions within the right hepatic lobe (axial series 3, image 25) and the left hepatic lobe (axial image 23) that are again too small to further characterize but not significantly changed from recent prior. The gallbladder is unremarkable. No intrahepatic or extrahepatic biliary ductal dilatation. Pancreas: Unremarkable. No pancreatic ductal dilatation or surrounding inflammatory changes. Spleen: The spleen measures 11 cm in length, within normal limits. There is a 2.9 x  2.6 x 2.6 cm low-density lesion within the medial aspect of the spleen that is unchanged from 07/07/2022 recent CT but new from 12/10/2021. Adrenals/Urinary Tract: Normal bilateral adrenals. The kidneys enhance uniformly and are symmetric in size. There are again bilateral fluid attenuation renal cysts, measuring up to 3.8 cm on the right and 2.8 cm on the left. No follow-up imaging is recommended. The urinary bladder is decompressed, limiting evaluation. Stomach/Bowel: Moderate amount of stool throughout the colon. Normal appearance of the terminal ileum. Normal appendix (axial series 3, image 60) no dilated loops of bowel are seen to indicate bowel obstruction. Vascular/Lymphatic: No abdominal aortic aneurysm. The major intra-abdominal aortic branch vessels are patent. No significant change in mesenteric, porta hepatis, retroperitoneal, and pelvic lymphadenopathy compared to 07/07/2022, again new from 12/10/2021. Reference gastrohepatic ligament 2.0 cm short axis lymph node (axial series 2, image 21), lymph node at the anterior aspect of the splenic hilum measuring up to 2.0 cm in short axis (axial image 25), and conglomeration of aortocaval and para-aortic lymph nodes with a focal conglomeration measuring up to 2.8 cm in short axis (axial image 42), not significantly changed from 07/07/2022. Reproductive: The prostate again appears to be surgically absent. Other: No ventral abdominal hernia is seen. No free air or free fluid is seen within the abdomen or pelvis. Musculoskeletal: Chronic inferior L4 endplate mild concavity degenerative Schmorl's node is similar to prior. Anterior bridging osteophytes are again seen throughout the visualized inferior thoracic spine. There is complete anterior osseous fusion of the left sacroiliac joint. Moderate anterior right sacroiliac joint degenerative osteophytes. IMPRESSION: Compared to 07/07/2022: 1. No bowel obstruction.  Normal appendix. 2. No significant change in  mesenteric, porta hepatis, retroperitoneal, and pelvic lymphadenopathy compared to 07/07/2022, again new from 12/10/2021. 3. No significant change in 2.9 cm low-density lesion within the medial aspect of the spleen compared to 07/07/2022 but new from 12/10/2021. 4. Constellation of findings are again concerning for lymphoma versus metastatic disease. Electronically Signed   By: Neita Garnet M.D.   On: 07/15/2022 11:59   CT Chest Wo Contrast  Result Date: 07/07/2022 CLINICAL DATA:  Occult malignancy. EXAM: CT CHEST WITHOUT CONTRAST TECHNIQUE: Multidetector CT imaging of the chest was performed following the standard protocol without IV contrast. RADIATION DOSE REDUCTION: This exam was performed according to the departmental dose-optimization program which includes automated exposure control, adjustment of the mA and/or kV according to patient size and/or use of iterative reconstruction technique. COMPARISON:  12/15/2021. FINDINGS: Cardiovascular: The heart is normal in size and there is a trace pericardial effusion. A calcification is noted in  the left anterior descending artery. The aorta and pulmonary trunk are normal in caliber. Mediastinum/Nodes: Multiple enlarged lymph nodes are present in the mediastinum measuring up to 1.9 cm in the prevascular space. Axillary lymphadenopathy is present bilaterally. Enlarged lymph nodes are seen in the suprasellar clavicular regions bilaterally. Evaluation of the hila is limited due to lack of IV contrast. The thyroid gland, trachea, and esophagus are within normal limits. Lungs/Pleura: Atelectasis or scarring is present at the lung bases. No effusion or pneumothorax. Upper Abdomen: Multiple enlarged lymph nodes are present in the gastrohepatic ligament and porta hepatis and retroperitoneum, unchanged from the previous exam. Musculoskeletal: Mild degenerative changes are noted in the thoracic spine. No acute or suspicious osseous abnormality. IMPRESSION: 1. Multiple  enlarged lymph nodes in the mediastinum, bilateral axilla, subclavian regions bilaterally, and upper abdomen, suspicious for lymphoma or metastatic disease. 2. Coronary artery calcification. Electronically Signed   By: Thornell Sartorius M.D.   On: 07/07/2022 03:55   CT ABDOMEN PELVIS W CONTRAST  Result Date: 07/07/2022 CLINICAL DATA:  Abdominal pain, acute, nonlocalized. EXAM: CT ABDOMEN AND PELVIS WITH CONTRAST TECHNIQUE: Multidetector CT imaging of the abdomen and pelvis was performed using the standard protocol following bolus administration of intravenous contrast. RADIATION DOSE REDUCTION: This exam was performed according to the departmental dose-optimization program which includes automated exposure control, adjustment of the mA and/or kV according to patient size and/or use of iterative reconstruction technique. CONTRAST:  75mL OMNIPAQUE IOHEXOL 350 MG/ML SOLN COMPARISON:  12/10/2021. FINDINGS: Lower chest: Mild atelectasis or scarring is present at the lung bases. Hepatobiliary: Subcentimeter hypodensities are present in the liver which are too small to further characterize. No biliary ductal dilatation. The gallbladder is without stones. Pancreas: Unremarkable. No pancreatic ductal dilatation or surrounding inflammatory changes. Spleen: Normal size. There is a hypodensity in the spleen measuring 2.7 cm which is new from the previous exam. Adrenals/Urinary Tract: Adrenal glands are unremarkable. Cysts are noted within the kidneys bilaterally no renal calculus or hydronephrosis. No renal calculus or hydronephrosis. The bladder is unremarkable. Stomach/Bowel: Stomach is within normal limits. Appendix appears normal. No evidence of bowel wall thickening, distention, or inflammatory changes. No free air or pneumatosis. Vascular/Lymphatic: No significant vascular findings are present. Multiple enlarged lymph nodes are present in the gastrohepatic ligament, porta hepatis, splenic hilum, retroperitoneum,  mesentery, and along the pelvic side walls bilaterally. The largest lymph node at the porta hepatis measures up to 3.2 cm in short axis diameter. Reproductive: Status post prostatectomy. Other: No abdominopelvic ascites. Musculoskeletal: Degenerative changes are present in the thoracolumbar spine. No acute or suspicious osseous abnormality. IMPRESSION: 1. No acute intra-abdominal process. 2. Multiple enlarged lymph nodes in the gastrohepatic ligament, porta hepatis, retroperitoneum, mesentery, and splenic hilum, and pelvic sidewall. A new hypodense lesion is present in the spleen measuring 2.7 cm. Differential diagnosis includes lymphoma or metastatic disease. Electronically Signed   By: Thornell Sartorius M.D.   On: 07/07/2022 02:49    Labs:  CBC: Recent Labs    05/23/22 1451 07/06/22 2139 07/08/22 1230 07/15/22 0859  WBC 6.6 10.0 10.0 10.3  HGB 14.6 9.9* 10.2* 8.8*  HCT 45.1 30.1* 30.0* 27.0*  PLT 291 208 187 196    COAGS: Recent Labs    12/11/21 0500 12/11/21 1123  INR 1.4* 1.4*  APTT  --  30    BMP: Recent Labs    05/23/22 1451 07/06/22 2139 07/08/22 1230 07/15/22 0859  NA 138 132* 134* 131*  K 4.0 4.0 4.1 4.1  CL  99 97* 98 95*  CO2 24 22 26 23   GLUCOSE 100* 101* 116* 115*  BUN 9 11 13 14   CALCIUM 9.4 8.9 9.1 8.6*  CREATININE 0.95 0.91 0.87 1.00  GFRNONAA >60 >60 >60 >60    LIVER FUNCTION TESTS: Recent Labs    05/23/22 1451 07/06/22 2139 07/08/22 1230 07/15/22 0859  BILITOT 0.7 1.0 0.9 1.2  AST 44* 25 23 42*  ALT 61* 24 22 32  ALKPHOS 111 97 94 95  PROT 7.6 6.7 6.9 6.2*  ALBUMIN 3.8 2.9* 3.6 2.6*    TUMOR MARKERS: No results for input(s): "AFPTM", "CEA", "CA199", "CHROMGRNA" in the last 8760 hours.  Assessment and Plan:  Hx Prostate Ca 2021 Myelodysplastic disorder- low grade per Dr Candise Che Imaging with abnormal lymphadenopathy Pt is scheduled for left axillary lymph node biopsy Risks and benefits of left axillary lymph node biopsy was discussed with  the patient and/or patient's family including, but not limited to bleeding, infection, damage to adjacent structures or low yield requiring additional tests.  All of the questions were answered and there is agreement to proceed.  Consent signed and in chart.  Thank you for this interesting consult.  I greatly enjoyed meeting Tyler [name] and look forward to participating in their care.  A copy of this report was sent to the requesting provider on this date.  Electronically Signed: Robet Leu, PA-C 07/16/2022, 9:22 AM   I spent a total of  30 Minutes   in face to face in clinical consultation, greater than 50% of which was counseling/coordinating care for Left Axillary Lymph node biopsy

## 2022-07-16 NOTE — Procedures (Signed)
Interventional Radiology Procedure Note  Procedure: US guided biopsy of left axillary node Complications: None EBL: None Recommendations:   - Routine wound care - Follow up pathology - Advance diet  - DC when goals met - shower Wed am  Signed,  Gilmer Mor, DO

## 2022-07-16 NOTE — ED Notes (Signed)
Pt states he doesn't need to be seen and was taken upstairs for biopsy

## 2022-07-16 NOTE — ED Provider Notes (Signed)
8:10 AM reported to bedside for initial evaluation however, patient already brought upstairs for biopsy.  Patient scheduled for biopsy today and instead of reporting to designated location reported to the emergency department. Seen yesterday where CT abdomen performed with concerns for lymphoma vs. Metastatic disease.    Mannie Stabile, PA-C 07/16/22 Tyler [name]    Tyler Dibbles, MD 07/17/22 [phone]

## 2022-07-18 ENCOUNTER — Other Ambulatory Visit: Payer: Self-pay | Admitting: Hematology

## 2022-07-18 LAB — SURGICAL PATHOLOGY

## 2022-07-18 MED ORDER — PREDNISONE 20 MG PO TABS: 60.0000 mg | ORAL_TABLET | Freq: Every day | ORAL | 0 refills | Status: AC

## 2022-07-19 ENCOUNTER — Inpatient Hospital Stay: Payer: 59

## 2022-07-19 ENCOUNTER — Other Ambulatory Visit: Payer: Self-pay

## 2022-07-19 ENCOUNTER — Inpatient Hospital Stay (HOSPITAL_BASED_OUTPATIENT_CLINIC_OR_DEPARTMENT_OTHER): Payer: 59 | Admitting: Hematology

## 2022-07-19 ENCOUNTER — Telehealth: Payer: Self-pay | Admitting: Hematology

## 2022-07-19 VITALS — BP 111/73 | HR 98 | Temp 97.0°F | Resp 18 | Ht 67.0 in | Wt 185.9 lb

## 2022-07-19 DIAGNOSIS — Z8546 Personal history of malignant neoplasm of prostate: Secondary | ICD-10-CM | POA: Diagnosis not present

## 2022-07-19 DIAGNOSIS — Z86711 Personal history of pulmonary embolism: Secondary | ICD-10-CM | POA: Diagnosis not present

## 2022-07-19 DIAGNOSIS — Z5111 Encounter for antineoplastic chemotherapy: Secondary | ICD-10-CM

## 2022-07-19 DIAGNOSIS — C8338 Diffuse large B-cell lymphoma, lymph nodes of multiple sites: Secondary | ICD-10-CM | POA: Diagnosis not present

## 2022-07-19 DIAGNOSIS — R590 Localized enlarged lymph nodes: Secondary | ICD-10-CM | POA: Diagnosis not present

## 2022-07-19 DIAGNOSIS — Z79899 Other long term (current) drug therapy: Secondary | ICD-10-CM | POA: Diagnosis not present

## 2022-07-19 DIAGNOSIS — D469 Myelodysplastic syndrome, unspecified: Secondary | ICD-10-CM | POA: Diagnosis not present

## 2022-07-19 DIAGNOSIS — Z9079 Acquired absence of other genital organ(s): Secondary | ICD-10-CM | POA: Diagnosis not present

## 2022-07-19 LAB — CBC WITH DIFFERENTIAL (CANCER CENTER ONLY)
Abs Immature Granulocytes: 0.09 10*3/uL — ABNORMAL HIGH (ref 0.00–0.07)
Basophils Absolute: 0 10*3/uL (ref 0.0–0.1)
Basophils Relative: 0 %
Eosinophils Absolute: 0 10*3/uL (ref 0.0–0.5)
Eosinophils Relative: 0 %
HCT: 24.1 % — ABNORMAL LOW (ref 39.0–52.0)
Hemoglobin: 8.1 g/dL — ABNORMAL LOW (ref 13.0–17.0)
Immature Granulocytes: 1 %
Lymphocytes Relative: 10 %
Lymphs Abs: 1.2 10*3/uL (ref 0.7–4.0)
MCH: 25.9 pg — ABNORMAL LOW (ref 26.0–34.0)
MCHC: 33.6 g/dL (ref 30.0–36.0)
MCV: 77 fL — ABNORMAL LOW (ref 80.0–100.0)
Monocytes Absolute: 2 10*3/uL — ABNORMAL HIGH (ref 0.1–1.0)
Monocytes Relative: 16 %
Neutro Abs: 9 10*3/uL — ABNORMAL HIGH (ref 1.7–7.7)
Neutrophils Relative %: 73 %
Platelet Count: 226 10*3/uL (ref 150–400)
RBC: 3.13 MIL/uL — ABNORMAL LOW (ref 4.22–5.81)
RDW: 17 % — ABNORMAL HIGH (ref 11.5–15.5)
WBC Count: 12.2 10*3/uL — ABNORMAL HIGH (ref 4.0–10.5)
nRBC: 0.2 % (ref 0.0–0.2)

## 2022-07-19 LAB — URIC ACID: Uric Acid, Serum: 8.1 mg/dL (ref 3.7–8.6)

## 2022-07-19 LAB — CMP (CANCER CENTER ONLY)
ALT: 35 U/L (ref 0–44)
AST: 31 U/L (ref 15–41)
Albumin: 3.5 g/dL (ref 3.5–5.0)
Alkaline Phosphatase: 94 U/L (ref 38–126)
Anion gap: 11 (ref 5–15)
BUN: 18 mg/dL (ref 8–23)
CO2: 26 mmol/L (ref 22–32)
Calcium: 8.9 mg/dL (ref 8.9–10.3)
Chloride: 95 mmol/L — ABNORMAL LOW (ref 98–111)
Creatinine: 0.8 mg/dL (ref 0.61–1.24)
GFR, Estimated: >60 mL/min (ref >60)
Glucose, Bld: 139 mg/dL — ABNORMAL HIGH (ref 70–99)
Potassium: 4 mmol/L (ref 3.5–5.1)
Sodium: 132 mmol/L — ABNORMAL LOW (ref 135–145)
Total Bilirubin: 1.2 mg/dL (ref 0.3–1.2)
Total Protein: 6.8 g/dL (ref 6.5–8.1)

## 2022-07-19 LAB — LACTATE DEHYDROGENASE: LDH: 1134 U/L — ABNORMAL HIGH (ref 98–192)

## 2022-07-19 LAB — HEPATITIS B SURFACE ANTIGEN: Hepatitis B Surface Ag: NONREACTIVE

## 2022-07-19 LAB — HEPATITIS C ANTIBODY: HCV Ab: NONREACTIVE

## 2022-07-19 LAB — HIV ANTIBODY (ROUTINE TESTING W REFLEX): HIV Screen 4th Generation wRfx: NONREACTIVE

## 2022-07-19 MED ORDER — OXYCODONE HCL 5 MG PO TABS: 5.0000 mg | ORAL_TABLET | Freq: Four times a day (QID) | ORAL | 0 refills | Status: DC | PRN

## 2022-07-19 MED ORDER — ALLOPURINOL 100 MG PO TABS: 100.0000 mg | ORAL_TABLET | Freq: Two times a day (BID) | ORAL | 0 refills | Status: DC

## 2022-07-19 MED ORDER — SENNOSIDES-DOCUSATE SODIUM 8.6-50 MG PO TABS: 2.0000 | ORAL_TABLET | Freq: Every evening | ORAL | 1 refills | Status: DC | PRN

## 2022-07-19 NOTE — Progress Notes (Signed)
HEMATOLOGY ONCOLOGY CLINIC NOTE  Date of service: 07/19/22    Patient Care Team: Nechama Guard, FNP as PCP - General (Family Medicine)  Diagnosis: Low grade MDS  Current Treatment: Surveillance, B complex  SUMMARY OF ONCOLOGIC HISTORY: Oncology History   No history exists.    HISTORY OF PRESENTING ILLNESS: Tyler [name] is a 72 y.o. male with a past medical history of prostate cancer status post prostatectomy in 2021, history of bilateral pulmonary embolism after his surgery in 2021 presenting today for follow up and management of MDS.  He originally presented to the ED on 11/28/21 for low back pain with radiation down his leg. He was diagnosed with sciatica at urgent care prior to the ED visit. He had a negative xray and CT. He returned to the ED after developing a fever and hematuria. His urologist had stopped his Eliquis 3 months earlier. While in the ED on 12/10/21 he was noted to be febrile with blood work showing HGB of 6.2 and a UTI. His stool was heme-negative. He was hospitalized for treatment of his UTI with secondary sepsis.  Today, he states that he has been doing okay since our last visit. He feels that he is getting stronger.   However, he continues to have low back pain which is worsened with bending over and lying down on his back. His pain radiates into his lateral right leg. He has been walking for half an hour or so but notes that his pain worsens with walking in the cold. He has not been working since I last saw him. His back pain has improved somewhat since he stopped working. He is scheduled to see his PCP next week and plans to discuss referral for orthospine at that time.  He notes some dizziness when he takes some of his medications.  Since his last visit he has completely stopped drinking alcohol and use of Ibuprofen. His appetite has improved. His HGB has continued to improve to 10.1. His platelets have also improved to 74,000. His WBC counts continue to  remain WNL. CMP also improved.  He reports having some enlarged lymph nodes in the left anterior cervical region x1 month. He denies any dental problems. He denies any other lumps or bumps.  He reports a recent episode of chills. He denies any recent vaccines. He denies signs of infection such as sore throat, sinus drainage, cough, or urinary symptoms. He denies fevers. He denies nausea, vomiting, chest pain, dyspnea or cough. He denies any bowel changes. His weight has decreased 6 pounds over last 1.5 months .   INTERVAL HISTORY:  Tyler [name] is a 72 y.o. male here for continued evaluation and management of his low grade MDS.   Patient was last seen by me on 07/08/2022 and complained of nausea, chills, memory problem, mild fever, left abdominal pain worsened when sleeping, feeling full, lack of appetite, enlarged lymph nodes near his groin area and near his left neck, mild occasional night sweats, and worsening back pain.  Today, he is accompanied by two family members and an additional family member is on call. He reports that he was able to start Prednisone this morning. He complains of worsened sweating, back pain in the right plank, and poor p.o. intake. He denies any new lumps/bumps, abdominal pain, SOB, or urinary problems. He does complain of frequent constipation but did have a BM this morning.   The severity of his abdominal pain fluctuates. He notes that his abdominal pain does limit his ability  to pay attention to his hearing and causes sleep disturbances. Patient takes Tylenol sometimes and Oxycodone as needed. Oxycodone does control pain.   REVIEW OF SYSTEMS:    10 Point review of Systems was done is negative except as noted above.   Past Medical History:  Diagnosis Date   Cancer [facility])    prostate   Hypertension    Past Surgical History:  Procedure Laterality Date   LYMPHADENECTOMY Bilateral 09/01/2019   Procedure: LYMPHADENECTOMY;  Surgeon: Sebastian Ache, MD;  Location:  [facility];  Service: Urology;  Laterality: Bilateral;   ROBOT ASSISTED LAPAROSCOPIC RADICAL PROSTATECTOMY N/A 09/01/2019   Procedure: XI ROBOTIC ASSISTED LAPAROSCOPIC RADICAL PROSTATECTOMY;  Surgeon: Sebastian Ache, MD;  Location: [facility];  Service: Urology;  Laterality: N/A;  3 HRS   Social History   Tobacco Use   Smoking status: Never   Smokeless tobacco: Never  Vaping Use   Vaping Use: Never used  Substance Use Topics   Alcohol use: Yes    Comment: at least one 40 oz beer daily   Drug use: Never    ALLERGIES:  is allergic to amitriptyline, amlodipine, and neurontin [gabapentin].  MEDICATIONS:  Current Outpatient Medications  Medication Sig Dispense Refill   amitriptyline (ELAVIL) 10 MG tablet Take 1 tablet (10 mg total) by mouth at bedtime. 30 tablet 1   B Complex Vitamins (VITAMIN B COMPLEX) TABS Take 1 tablet by mouth daily. 30 tablet 0   cholecalciferol (VITAMIN D3) 25 MCG (1000 UNIT) tablet Take 2 tablets (2,000 Units total) by mouth daily.     diclofenac Sodium (VOLTAREN) 1 % GEL APPLY 4 G TOPICALLY 4 (FOUR) TIMES DAILY. APPLY TO RIGHT LATERAL HIP AND THIGH DOWN TO KNEE 300 g 1   gabapentin (NEURONTIN) 100 MG capsule Take 1 capsule (100 mg total) by mouth at bedtime for 14 days, THEN 2 capsules (200 mg total) at bedtime. (Patient not taking: Reported on 07/08/2022) 75 capsule 0   lisinopril (ZESTRIL) 5 MG tablet Take 5 mg by mouth daily.     Menthol-Methyl Salicylate (MUSCLE RUB) 10-15 % CREA Apply 1 Application topically daily as needed for muscle pain.     metoCLOPramide (REGLAN) 10 MG tablet Take 1 tablet (10 mg total) by mouth every 6 (six) hours as needed for nausea. 30 tablet 0   Multiple Vitamin (MULTIVITAMIN WITH MINERALS) TABS tablet Take 1 tablet by mouth daily.     oxyCODONE (OXY IR/ROXICODONE) 5 MG immediate release tablet Take 1 tablet (5 mg total) by mouth every 6 (six) hours as needed for severe pain. 30 tablet 0   predniSONE (DELTASONE) 20 MG tablet Take 3 tablets  (60 mg total) by mouth daily with breakfast for 10 days. 30 tablet 0   senna-docusate (SENNA S) 8.6-50 MG tablet Take 2 tablets by mouth at bedtime as needed for mild constipation. 60 tablet 1   No current facility-administered medications for this visit.    PHYSICAL EXAMINATION: ECOG PERFORMANCE STATUS: 1 - Symptomatic but completely ambulatory  . Vitals:   07/19/22 1321  BP: 111/73  Pulse: 98  Resp: 18  Temp: (!) 97 F (36.1 C)  SpO2: 100%   Filed Weights   07/19/22 1321  Weight: 185 lb 14.4 oz (84.3 kg)  .Body mass index is 29.12 kg/m.   GENERAL:alert, in no acute distress and comfortable SKIN: no acute rashes, no significant lesions EYES: conjunctiva are pink and non-injected, sclera anicteric OROPHARYNX: MMM, no exudates, no oropharyngeal erythema or ulceration NECK: supple, no JVD  LYMPH:  no palpable lymphadenopathy in the cervical, axillary or inguinal regions LUNGS: clear to auscultation b/l with normal respiratory effort HEART: regular rate & rhythm ABDOMEN:  normoactive bowel sounds , non tender, not distended. Extremity: no pedal edema PSYCH: alert & oriented x 3 with fluent speech NEURO: no focal motor/sensory deficits   LABORATORY DATA:   I have reviewed the data as listed .    Latest Ref Rng & Units 07/15/2022    8:59 AM 07/08/2022   12:30 PM 07/06/2022    9:39 PM  CBC  WBC 4.0 - 10.5 K/uL 10.3  10.0  10.0   Hemoglobin 13.0 - 17.0 g/dL 8.8  04.5  9.9   Hematocrit 39.0 - 52.0 % 27.0  30.0  30.1   Platelets 150 - 400 K/uL 196  187  208    .    Latest Ref Rng & Units 07/15/2022    8:59 AM 07/08/2022   12:30 PM 07/06/2022    9:39 PM  CMP  Glucose 70 - 99 mg/dL 409  811  914   BUN 8 - 23 mg/dL 14  13  11    Creatinine 0.61 - 1.24 mg/dL 7.82  9.56  2.13   Sodium 135 - 145 mmol/L 131  134  132   Potassium 3.5 - 5.1 mmol/L 4.1  4.1  4.0   Chloride 98 - 111 mmol/L 95  98  97   CO2 22 - 32 mmol/L 23  26  22    Calcium 8.9 - 10.3 mg/dL 8.6  9.1  8.9    Total Protein 6.5 - 8.1 g/dL 6.2  6.9  6.7   Total Bilirubin 0.3 - 1.2 mg/dL 1.2  0.9  1.0   Alkaline Phos 38 - 126 U/L 95  94  97   AST 15 - 41 U/L 42  23  25   ALT 0 - 44 U/L 32  22  24    Lymph node biopsy 07/16/2022    RADIOGRAPHIC STUDIES: I have personally reviewed the radiological images as listed and agreed with the findings in the report. [place] CORE BIOPSY (LYMPH NODES)  Result Date: 07/16/2022 INDICATION: 72 year old male referred for biopsy of axillary lymph node EXAM: ULTRASOUND-GUIDED BIOPSY AXILLARY LYMPH NODE MEDICATIONS: None. ANESTHESIA/SEDATION: None COMPLICATIONS: None PROCEDURE: Informed written consent was obtained from the patient with interpreter after a thorough discussion of the procedural risks, benefits and alternatives. All questions were addressed. Maximal Sterile Barrier Technique was utilized including caps, mask, sterile gowns, sterile gloves, sterile drape, hand hygiene and skin antiseptic. A timeout was performed prior to the initiation of the procedure. Ultrasound survey was performed with images stored and sent to PACs. The left axillary region was prepped with chlorhexidine in a sterile fashion, and a sterile drape was applied covering the operative field. A sterile gown and sterile gloves were used for the procedure. Local anesthesia was provided with 1% Lidocaine. Ultrasound guidance was used to infiltrate the region with 1% lidocaine for local anesthesia. Four separate 16 gauge core needle biopsy were then acquired of the left axillary lymph node using ultrasound guidance. Images were stored. Final image was stored after biopsy. Patient tolerated the procedure well and remained hemodynamically stable throughout. No complications were encountered and no significant blood loss was encounter IMPRESSION: Status post ultrasound-guided biopsy of left axillary lymph node Signed, Yvone Neu. Miachel Roux, RPVI [facility]  Radiology Electronically Signed   By: Gilmer Mor D.O.   On:  07/16/2022 17:07   CT ABDOMEN PELVIS W CONTRAST  Result Date: 07/15/2022 CLINICAL DATA:  Possible lymphoma. Bowel obstruction suspected. Right lower quadrant and right upper quadrant abdominal pain for 6 weeks. Nausea and vomiting. Scheduled for a lymph node biopsy tomorrow. EXAM: CT ABDOMEN AND PELVIS WITH CONTRAST TECHNIQUE: Multidetector CT imaging of the abdomen and pelvis was performed using the standard protocol following bolus administration of intravenous contrast. RADIATION DOSE REDUCTION: This exam was performed according to the departmental dose-optimization program which includes automated exposure control, adjustment of the mA and/or kV according to patient size and/or use of iterative reconstruction technique. CONTRAST:  75mL OMNIPAQUE IOHEXOL 350 MG/ML SOLN COMPARISON:  CT abdomen and pelvis 06/29/2022 FINDINGS: Lower chest: Mild curvilinear subsegmental atelectasis and/or scarring within the bilateral lung bases is similar to prior. Heart size is at the upper limits of normal. Coronary artery calcifications are noted. Hepatobiliary: Smooth liver contours. There are again punctate subcentimeter low-density lesions within the right hepatic lobe (axial series 3, image 25) and the left hepatic lobe (axial image 23) that are again too small to further characterize but not significantly changed from recent prior. The gallbladder is unremarkable. No intrahepatic or extrahepatic biliary ductal dilatation. Pancreas: Unremarkable. No pancreatic ductal dilatation or surrounding inflammatory changes. Spleen: The spleen measures 11 cm in length, within normal limits. There is a 2.9 x 2.6 x 2.6 cm low-density lesion within the medial aspect of the spleen that is unchanged from 07/07/2022 recent CT but new from 12/10/2021. Adrenals/Urinary Tract: Normal bilateral adrenals. The kidneys enhance uniformly and are symmetric in size. There are again  bilateral fluid attenuation renal cysts, measuring up to 3.8 cm on the right and 2.8 cm on the left. No follow-up imaging is recommended. The urinary bladder is decompressed, limiting evaluation. Stomach/Bowel: Moderate amount of stool throughout the colon. Normal appearance of the terminal ileum. Normal appendix (axial series 3, image 60) no dilated loops of bowel are seen to indicate bowel obstruction. Vascular/Lymphatic: No abdominal aortic aneurysm. The major intra-abdominal aortic branch vessels are patent. No significant change in mesenteric, porta hepatis, retroperitoneal, and pelvic lymphadenopathy compared to 07/07/2022, again new from 12/10/2021. Reference gastrohepatic ligament 2.0 cm short axis lymph node (axial series 2, image 21), lymph node at the anterior aspect of the splenic hilum measuring up to 2.0 cm in short axis (axial image 25), and conglomeration of aortocaval and para-aortic lymph nodes with a focal conglomeration measuring up to 2.8 cm in short axis (axial image 42), not significantly changed from 07/07/2022. Reproductive: The prostate again appears to be surgically absent. Other: No ventral abdominal hernia is seen. No free air or free fluid is seen within the abdomen or pelvis. Musculoskeletal: Chronic inferior L4 endplate mild concavity degenerative Schmorl's node is similar to prior. Anterior bridging osteophytes are again seen throughout the visualized inferior thoracic spine. There is complete anterior osseous fusion of the left sacroiliac joint. Moderate anterior right sacroiliac joint degenerative osteophytes. IMPRESSION: Compared to 07/07/2022: 1. No bowel obstruction.  Normal appendix. 2. No significant change in mesenteric, porta hepatis, retroperitoneal, and pelvic lymphadenopathy compared to 07/07/2022, again new from 12/10/2021. 3. No significant change in 2.9 cm low-density lesion within the medial aspect of the spleen compared to 07/07/2022 but new from 12/10/2021. 4.  Constellation of findings are again concerning for lymphoma versus metastatic disease. Electronically Signed   By: Neita Garnet M.D.   On: 07/15/2022 11:59   CT Chest Wo Contrast  Result Date: 07/07/2022 CLINICAL DATA:  Occult malignancy.  EXAM: CT CHEST WITHOUT CONTRAST TECHNIQUE: Multidetector CT imaging of the chest was performed following the standard protocol without IV contrast. RADIATION DOSE REDUCTION: This exam was performed according to the departmental dose-optimization program which includes automated exposure control, adjustment of the mA and/or kV according to patient size and/or use of iterative reconstruction technique. COMPARISON:  12/15/2021. FINDINGS: Cardiovascular: The heart is normal in size and there is a trace pericardial effusion. A calcification is noted in the left anterior descending artery. The aorta and pulmonary trunk are normal in caliber. Mediastinum/Nodes: Multiple enlarged lymph nodes are present in the mediastinum measuring up to 1.9 cm in the prevascular space. Axillary lymphadenopathy is present bilaterally. Enlarged lymph nodes are seen in the suprasellar clavicular regions bilaterally. Evaluation of the hila is limited due to lack of IV contrast. The thyroid gland, trachea, and esophagus are within normal limits. Lungs/Pleura: Atelectasis or scarring is present at the lung bases. No effusion or pneumothorax. Upper Abdomen: Multiple enlarged lymph nodes are present in the gastrohepatic ligament and porta hepatis and retroperitoneum, unchanged from the previous exam. Musculoskeletal: Mild degenerative changes are noted in the thoracic spine. No acute or suspicious osseous abnormality. IMPRESSION: 1. Multiple enlarged lymph nodes in the mediastinum, bilateral axilla, subclavian regions bilaterally, and upper abdomen, suspicious for lymphoma or metastatic disease. 2. Coronary artery calcification. Electronically Signed   By: Thornell Sartorius M.D.   On: 07/07/2022 03:55   CT  ABDOMEN PELVIS W CONTRAST  Result Date: 07/07/2022 CLINICAL DATA:  Abdominal pain, acute, nonlocalized. EXAM: CT ABDOMEN AND PELVIS WITH CONTRAST TECHNIQUE: Multidetector CT imaging of the abdomen and pelvis was performed using the standard protocol following bolus administration of intravenous contrast. RADIATION DOSE REDUCTION: This exam was performed according to the departmental dose-optimization program which includes automated exposure control, adjustment of the mA and/or kV according to patient size and/or use of iterative reconstruction technique. CONTRAST:  75mL OMNIPAQUE IOHEXOL 350 MG/ML SOLN COMPARISON:  12/10/2021. FINDINGS: Lower chest: Mild atelectasis or scarring is present at the lung bases. Hepatobiliary: Subcentimeter hypodensities are present in the liver which are too small to further characterize. No biliary ductal dilatation. The gallbladder is without stones. Pancreas: Unremarkable. No pancreatic ductal dilatation or surrounding inflammatory changes. Spleen: Normal size. There is a hypodensity in the spleen measuring 2.7 cm which is new from the previous exam. Adrenals/Urinary Tract: Adrenal glands are unremarkable. Cysts are noted within the kidneys bilaterally no renal calculus or hydronephrosis. No renal calculus or hydronephrosis. The bladder is unremarkable. Stomach/Bowel: Stomach is within normal limits. Appendix appears normal. No evidence of bowel wall thickening, distention, or inflammatory changes. No free air or pneumatosis. Vascular/Lymphatic: No significant vascular findings are present. Multiple enlarged lymph nodes are present in the gastrohepatic ligament, porta hepatis, splenic hilum, retroperitoneum, mesentery, and along the pelvic side walls bilaterally. The largest lymph node at the porta hepatis measures up to 3.2 cm in short axis diameter. Reproductive: Status post prostatectomy. Other: No abdominopelvic ascites. Musculoskeletal: Degenerative changes are present in the  thoracolumbar spine. No acute or suspicious osseous abnormality. IMPRESSION: 1. No acute intra-abdominal process. 2. Multiple enlarged lymph nodes in the gastrohepatic ligament, porta hepatis, retroperitoneum, mesentery, and splenic hilum, and pelvic sidewall. A new hypodense lesion is present in the spleen measuring 2.7 cm. Differential diagnosis includes lymphoma or metastatic disease. Electronically Signed   By: Thornell Sartorius M.D.   On: 07/07/2022 02:49    ASSESSMENT & PLAN:   72 y.o. male with:  Low grade MDS  2. Generalized  lypmhadenopathy  PLAN:  -Discussed lab results from 07/15/2022 in detail with pateint. CBC showed WBC of 10.3K, hemoglobin of 8.8, and platelets of 196K.  -mild anemia -patient last received echocardiogram October 2023 which showed normal EF -Patient has PET scan scheduled for two weeks -Would not recommend waiting to start treatment after receiving PET scan -discussed option to connect patient with radiology in [place] to receive PET scan to receive in quicker -informed patient that cytokines may cause low grade fever, weight loss, loss of appetite -anticipate that steroids may improve appetite -discussed results of biopsy of axillary lymph nodes which showed lymphoma.  -patient does endorse stage 3-4 disease. PET scan will provide further evaluation for staging -informed patient that high grade large B-cell lymhoma tends to be aggressive and typically very sensitive to treatment. Discussed goal to cure disease. -discussed details of R-CHOP treatment regimen involving a combination of chemotherapy and targeted immunotherapy occurring in 6 cycles. -also discussed daEPOCH-R but given concern for MDS and risk of significant cytopenia will proceed with R-CHOP pending FISH results for [facility] -informed patient that lymph nodes may potentially shrink by 70-80% with treatment -Discussed potential side affects of treatment such as heart issues, neuropathy, or low blood  counts -if patient is able to receive an echocardiogram and receive port placement as an outpatient next week, would then set up an education session and initiate treatment the following week -informed patient that treatment may cause tumor lysis syndrome and cause gout.  -Will start Allopurinol -will plan for port placement with interventional radiologist -will order blood test -will order echocardiogram -will plan for education session to discuss medication details -advised patient to limit any crowd exposure to reduce the risk of infections -will refill Oxycodone for pain management -will start Sena stool softener -recommend patient to regularly drink plenty of water to flush waste products in the system -answered all of patient's and his family member's questions in detail  FOLLOW-UP: Labs today Port a cath ASAP in 3-5 days ECHO In 3-5 days Chemo-counseling for R-CHOP Start R-CHOP in 7-10 days  The total time spent in the appointment was 40 minutes* .  All of the patient's questions were answered with apparent satisfaction. The patient knows to call the clinic with any problems, questions or concerns.   Wyvonnia Lora MD MS AAHIVMS [facility] Hematology/Oncology Physician [facility]  .*Total Encounter Time as defined by the Centers for Medicare and Medicaid Services includes, in addition to the face-to-face time of a patient visit (documented in the note above) non-face-to-face time: obtaining and reviewing outside history, ordering and reviewing medications, tests or procedures, care coordination (communications with other health care professionals or caregivers) and documentation in the medical record.    I,Mitra Faeizi,acting as a Neurosurgeon for Wyvonnia Lora, MD.,have documented all relevant documentation on the behalf of Wyvonnia Lora, MD,as directed by  Wyvonnia Lora, MD while in the presence of Wyvonnia Lora, MD.  .I have reviewed the above documentation for accuracy and  completeness, and I agree with the above. Johney Maine MD

## 2022-07-20 ENCOUNTER — Other Ambulatory Visit: Payer: Self-pay | Admitting: Family Medicine

## 2022-07-20 LAB — HEPATITIS B CORE ANTIBODY, TOTAL: Hep B Core Total Ab: REACTIVE — AB

## 2022-07-25 ENCOUNTER — Ambulatory Visit (HOSPITAL_COMMUNITY): Payer: 59

## 2022-07-25 ENCOUNTER — Encounter: Payer: Self-pay | Admitting: Hematology

## 2022-07-25 DIAGNOSIS — Z515 Encounter for palliative care: Secondary | ICD-10-CM | POA: Insufficient documentation

## 2022-07-25 DIAGNOSIS — Z5111 Encounter for antineoplastic chemotherapy: Secondary | ICD-10-CM | POA: Insufficient documentation

## 2022-07-25 DIAGNOSIS — C8338 Diffuse large B-cell lymphoma, lymph nodes of multiple sites: Secondary | ICD-10-CM | POA: Insufficient documentation

## 2022-07-25 NOTE — Progress Notes (Signed)
START ON PATHWAY REGIMEN - Lymphoma and CLL     A cycle is every 21 days:     Prednisone      Rituximab-xxxx      Cyclophosphamide      Doxorubicin      Vincristine   **Always confirm dose/schedule in your pharmacy ordering system**  Patient Characteristics: Diffuse Large B-Cell Lymphoma or Follicular Lymphoma, Grade 3B, First Line, Stage III and IV Disease Type: Not Applicable Disease Type: Diffuse Large B-Cell Lymphoma Disease Type: Not Applicable Line of therapy: First Line Intent of Therapy: Curative Intent, Discussed with Patient 

## 2022-07-26 ENCOUNTER — Other Ambulatory Visit: Payer: Self-pay | Admitting: Hematology

## 2022-07-26 ENCOUNTER — Encounter (HOSPITAL_COMMUNITY): Payer: Self-pay | Admitting: Hematology

## 2022-07-26 ENCOUNTER — Other Ambulatory Visit: Payer: Self-pay

## 2022-07-26 ENCOUNTER — Ambulatory Visit: Payer: 59 | Admitting: Family Medicine

## 2022-07-26 ENCOUNTER — Inpatient Hospital Stay: Payer: 59

## 2022-07-26 DIAGNOSIS — Z5111 Encounter for antineoplastic chemotherapy: Secondary | ICD-10-CM

## 2022-07-26 DIAGNOSIS — C8338 Diffuse large B-cell lymphoma, lymph nodes of multiple sites: Secondary | ICD-10-CM

## 2022-07-26 MED ORDER — ONDANSETRON HCL 8 MG PO TABS
8.0000 mg | ORAL_TABLET | Freq: Three times a day (TID) | ORAL | 1 refills | Status: DC | PRN
Start: 2022-07-26 — End: 2023-05-24

## 2022-07-26 MED ORDER — PREDNISONE 20 MG PO TABS
60.0000 mg | ORAL_TABLET | Freq: Every day | ORAL | 5 refills | Status: DC
Start: 2022-07-26 — End: 2022-08-08

## 2022-07-26 MED ORDER — PROCHLORPERAZINE MALEATE 10 MG PO TABS
10.0000 mg | ORAL_TABLET | Freq: Four times a day (QID) | ORAL | 6 refills | Status: DC | PRN
Start: 2022-07-26 — End: 2022-08-28

## 2022-07-26 MED ORDER — ACYCLOVIR 400 MG PO TABS
400.0000 mg | ORAL_TABLET | Freq: Every day | ORAL | 3 refills | Status: DC
Start: 2022-07-26 — End: 2022-08-08

## 2022-07-26 MED ORDER — ALLOPURINOL 100 MG PO TABS
100.0000 mg | ORAL_TABLET | Freq: Two times a day (BID) | ORAL | 0 refills | Status: DC
Start: 2022-07-26 — End: 2022-08-08

## 2022-07-26 MED ORDER — LIDOCAINE-PRILOCAINE 2.5-2.5 % EX CREA
TOPICAL_CREAM | CUTANEOUS | 3 refills | Status: DC
Start: 2022-07-26 — End: 2022-09-18

## 2022-07-27 ENCOUNTER — Other Ambulatory Visit: Payer: Self-pay

## 2022-07-29 ENCOUNTER — Inpatient Hospital Stay (HOSPITAL_COMMUNITY)
Admission: EM | Admit: 2022-07-29 | Discharge: 2022-08-28 | DRG: 853 | Disposition: A | Discharge status: Skilled Nursing Facility | Payer: 59 | Attending: Internal Medicine | Admitting: Internal Medicine

## 2022-07-29 ENCOUNTER — Emergency Department (HOSPITAL_COMMUNITY): Payer: 59

## 2022-07-29 ENCOUNTER — Telehealth: Payer: Self-pay | Admitting: Hematology

## 2022-07-29 ENCOUNTER — Other Ambulatory Visit: Payer: Self-pay

## 2022-07-29 ENCOUNTER — Encounter (HOSPITAL_COMMUNITY): Payer: Self-pay | Admitting: *Deleted

## 2022-07-29 ENCOUNTER — Ambulatory Visit (HOSPITAL_BASED_OUTPATIENT_CLINIC_OR_DEPARTMENT_OTHER)
Admission: RE | Admit: 2022-07-29 | Discharge: 2022-07-29 | Disposition: A | Discharge status: Home / Self Care | Payer: 59 | Source: Ambulatory Visit | Attending: Hematology | Admitting: Hematology

## 2022-07-29 DIAGNOSIS — M47816 Spondylosis without myelopathy or radiculopathy, lumbar region: Secondary | ICD-10-CM | POA: Diagnosis present

## 2022-07-29 DIAGNOSIS — G9389 Other specified disorders of brain: Secondary | ICD-10-CM | POA: Diagnosis not present

## 2022-07-29 DIAGNOSIS — Z823 Family history of stroke: Secondary | ICD-10-CM

## 2022-07-29 DIAGNOSIS — D701 Agranulocytosis secondary to cancer chemotherapy: Secondary | ICD-10-CM | POA: Diagnosis not present

## 2022-07-29 DIAGNOSIS — R0600 Dyspnea, unspecified: Secondary | ICD-10-CM | POA: Diagnosis not present

## 2022-07-29 DIAGNOSIS — C8290 Follicular lymphoma, unspecified, unspecified site: Secondary | ICD-10-CM | POA: Diagnosis not present

## 2022-07-29 DIAGNOSIS — F419 Anxiety disorder, unspecified: Secondary | ICD-10-CM | POA: Diagnosis present

## 2022-07-29 DIAGNOSIS — K253 Acute gastric ulcer without hemorrhage or perforation: Secondary | ICD-10-CM | POA: Diagnosis present

## 2022-07-29 DIAGNOSIS — J90 Pleural effusion, not elsewhere classified: Secondary | ICD-10-CM | POA: Diagnosis not present

## 2022-07-29 DIAGNOSIS — Z9189 Other specified personal risk factors, not elsewhere classified: Secondary | ICD-10-CM

## 2022-07-29 DIAGNOSIS — D649 Anemia, unspecified: Secondary | ICD-10-CM | POA: Diagnosis not present

## 2022-07-29 DIAGNOSIS — R0602 Shortness of breath: Secondary | ICD-10-CM | POA: Diagnosis not present

## 2022-07-29 DIAGNOSIS — R569 Unspecified convulsions: Secondary | ICD-10-CM | POA: Diagnosis not present

## 2022-07-29 DIAGNOSIS — E883 Tumor lysis syndrome: Secondary | ICD-10-CM

## 2022-07-29 DIAGNOSIS — T17908S Unspecified foreign body in respiratory tract, part unspecified causing other injury, sequela: Secondary | ICD-10-CM | POA: Diagnosis not present

## 2022-07-29 DIAGNOSIS — R55 Syncope and collapse: Secondary | ICD-10-CM | POA: Diagnosis not present

## 2022-07-29 DIAGNOSIS — E441 Mild protein-calorie malnutrition: Secondary | ICD-10-CM | POA: Diagnosis present

## 2022-07-29 DIAGNOSIS — C8338 Diffuse large B-cell lymphoma, lymph nodes of multiple sites: Secondary | ICD-10-CM | POA: Diagnosis not present

## 2022-07-29 DIAGNOSIS — R195 Other fecal abnormalities: Secondary | ICD-10-CM | POA: Diagnosis present

## 2022-07-29 DIAGNOSIS — E860 Dehydration: Secondary | ICD-10-CM | POA: Diagnosis present

## 2022-07-29 DIAGNOSIS — G9341 Metabolic encephalopathy: Secondary | ICD-10-CM | POA: Diagnosis not present

## 2022-07-29 DIAGNOSIS — I472 Ventricular tachycardia, unspecified: Secondary | ICD-10-CM | POA: Diagnosis present

## 2022-07-29 DIAGNOSIS — Z5111 Encounter for antineoplastic chemotherapy: Secondary | ICD-10-CM | POA: Diagnosis not present

## 2022-07-29 DIAGNOSIS — R627 Adult failure to thrive: Secondary | ICD-10-CM | POA: Diagnosis present

## 2022-07-29 DIAGNOSIS — C8510 Unspecified B-cell lymphoma, unspecified site: Secondary | ICD-10-CM | POA: Diagnosis not present

## 2022-07-29 DIAGNOSIS — K567 Ileus, unspecified: Secondary | ICD-10-CM | POA: Diagnosis not present

## 2022-07-29 DIAGNOSIS — Z781 Physical restraint status: Secondary | ICD-10-CM

## 2022-07-29 DIAGNOSIS — R1110 Vomiting, unspecified: Secondary | ICD-10-CM | POA: Diagnosis not present

## 2022-07-29 DIAGNOSIS — J690 Pneumonitis due to inhalation of food and vomit: Secondary | ICD-10-CM | POA: Diagnosis present

## 2022-07-29 DIAGNOSIS — D61810 Antineoplastic chemotherapy induced pancytopenia: Secondary | ICD-10-CM | POA: Diagnosis not present

## 2022-07-29 DIAGNOSIS — C61 Malignant neoplasm of prostate: Secondary | ICD-10-CM | POA: Diagnosis present

## 2022-07-29 DIAGNOSIS — Z8546 Personal history of malignant neoplasm of prostate: Secondary | ICD-10-CM

## 2022-07-29 DIAGNOSIS — K8689 Other specified diseases of pancreas: Secondary | ICD-10-CM | POA: Diagnosis present

## 2022-07-29 DIAGNOSIS — J9601 Acute respiratory failure with hypoxia: Secondary | ICD-10-CM | POA: Diagnosis not present

## 2022-07-29 DIAGNOSIS — G9340 Encephalopathy, unspecified: Secondary | ICD-10-CM | POA: Diagnosis not present

## 2022-07-29 DIAGNOSIS — S025XXA Fracture of tooth (traumatic), initial encounter for closed fracture: Secondary | ICD-10-CM | POA: Diagnosis present

## 2022-07-29 DIAGNOSIS — Z452 Encounter for adjustment and management of vascular access device: Secondary | ICD-10-CM | POA: Diagnosis not present

## 2022-07-29 DIAGNOSIS — R059 Cough, unspecified: Secondary | ICD-10-CM | POA: Diagnosis not present

## 2022-07-29 DIAGNOSIS — E875 Hyperkalemia: Secondary | ICD-10-CM | POA: Diagnosis not present

## 2022-07-29 DIAGNOSIS — K254 Chronic or unspecified gastric ulcer with hemorrhage: Secondary | ICD-10-CM | POA: Diagnosis present

## 2022-07-29 DIAGNOSIS — Z79899 Other long term (current) drug therapy: Secondary | ICD-10-CM

## 2022-07-29 DIAGNOSIS — D6481 Anemia due to antineoplastic chemotherapy: Secondary | ICD-10-CM | POA: Diagnosis not present

## 2022-07-29 DIAGNOSIS — Z6832 Body mass index (BMI) 32.0-32.9, adult: Secondary | ICD-10-CM

## 2022-07-29 DIAGNOSIS — R109 Unspecified abdominal pain: Secondary | ICD-10-CM | POA: Diagnosis not present

## 2022-07-29 DIAGNOSIS — Z87440 Personal history of urinary (tract) infections: Secondary | ICD-10-CM

## 2022-07-29 DIAGNOSIS — Y92009 Unspecified place in unspecified non-institutional (private) residence as the place of occurrence of the external cause: Secondary | ICD-10-CM

## 2022-07-29 DIAGNOSIS — K5900 Constipation, unspecified: Secondary | ICD-10-CM | POA: Diagnosis present

## 2022-07-29 DIAGNOSIS — B181 Chronic viral hepatitis B without delta-agent: Secondary | ICD-10-CM | POA: Diagnosis present

## 2022-07-29 DIAGNOSIS — I10 Essential (primary) hypertension: Secondary | ICD-10-CM | POA: Diagnosis present

## 2022-07-29 DIAGNOSIS — R1030 Lower abdominal pain, unspecified: Secondary | ICD-10-CM | POA: Diagnosis not present

## 2022-07-29 DIAGNOSIS — Z86711 Personal history of pulmonary embolism: Secondary | ICD-10-CM

## 2022-07-29 DIAGNOSIS — K259 Gastric ulcer, unspecified as acute or chronic, without hemorrhage or perforation: Secondary | ICD-10-CM | POA: Diagnosis not present

## 2022-07-29 DIAGNOSIS — I480 Paroxysmal atrial fibrillation: Secondary | ICD-10-CM | POA: Diagnosis present

## 2022-07-29 DIAGNOSIS — E8770 Fluid overload, unspecified: Secondary | ICD-10-CM | POA: Diagnosis not present

## 2022-07-29 DIAGNOSIS — G9349 Other encephalopathy: Secondary | ICD-10-CM | POA: Diagnosis not present

## 2022-07-29 DIAGNOSIS — N19 Unspecified kidney failure: Secondary | ICD-10-CM | POA: Diagnosis not present

## 2022-07-29 DIAGNOSIS — E871 Hypo-osmolality and hyponatremia: Secondary | ICD-10-CM | POA: Diagnosis present

## 2022-07-29 DIAGNOSIS — N390 Urinary tract infection, site not specified: Secondary | ICD-10-CM

## 2022-07-29 DIAGNOSIS — Z751 Person awaiting admission to adequate facility elsewhere: Secondary | ICD-10-CM

## 2022-07-29 DIAGNOSIS — Z888 Allergy status to other drugs, medicaments and biological substances status: Secondary | ICD-10-CM

## 2022-07-29 DIAGNOSIS — K921 Melena: Secondary | ICD-10-CM | POA: Diagnosis not present

## 2022-07-29 DIAGNOSIS — M4856XA Collapsed vertebra, not elsewhere classified, lumbar region, initial encounter for fracture: Secondary | ICD-10-CM | POA: Diagnosis present

## 2022-07-29 DIAGNOSIS — D630 Anemia in neoplastic disease: Secondary | ICD-10-CM | POA: Diagnosis not present

## 2022-07-29 DIAGNOSIS — Z743 Need for continuous supervision: Secondary | ICD-10-CM | POA: Diagnosis not present

## 2022-07-29 DIAGNOSIS — R1310 Dysphagia, unspecified: Secondary | ICD-10-CM | POA: Diagnosis not present

## 2022-07-29 DIAGNOSIS — G4700 Insomnia, unspecified: Secondary | ICD-10-CM | POA: Diagnosis present

## 2022-07-29 DIAGNOSIS — T451X5A Adverse effect of antineoplastic and immunosuppressive drugs, initial encounter: Secondary | ICD-10-CM | POA: Diagnosis not present

## 2022-07-29 DIAGNOSIS — I119 Hypertensive heart disease without heart failure: Secondary | ICD-10-CM | POA: Insufficient documentation

## 2022-07-29 DIAGNOSIS — N179 Acute kidney failure, unspecified: Secondary | ICD-10-CM | POA: Diagnosis not present

## 2022-07-29 DIAGNOSIS — K219 Gastro-esophageal reflux disease without esophagitis: Secondary | ICD-10-CM | POA: Diagnosis present

## 2022-07-29 DIAGNOSIS — I4819 Other persistent atrial fibrillation: Secondary | ICD-10-CM | POA: Diagnosis not present

## 2022-07-29 DIAGNOSIS — R768 Other specified abnormal immunological findings in serum: Secondary | ICD-10-CM | POA: Diagnosis not present

## 2022-07-29 DIAGNOSIS — Z4659 Encounter for fitting and adjustment of other gastrointestinal appliance and device: Secondary | ICD-10-CM | POA: Diagnosis not present

## 2022-07-29 DIAGNOSIS — C8590 Non-Hodgkin lymphoma, unspecified, unspecified site: Secondary | ICD-10-CM | POA: Insufficient documentation

## 2022-07-29 DIAGNOSIS — E8809 Other disorders of plasma-protein metabolism, not elsewhere classified: Secondary | ICD-10-CM | POA: Diagnosis present

## 2022-07-29 DIAGNOSIS — R202 Paresthesia of skin: Secondary | ICD-10-CM | POA: Diagnosis present

## 2022-07-29 DIAGNOSIS — N170 Acute kidney failure with tubular necrosis: Secondary | ICD-10-CM | POA: Diagnosis not present

## 2022-07-29 DIAGNOSIS — R6520 Severe sepsis without septic shock: Secondary | ICD-10-CM

## 2022-07-29 DIAGNOSIS — W19XXXA Unspecified fall, initial encounter: Secondary | ICD-10-CM | POA: Diagnosis present

## 2022-07-29 DIAGNOSIS — R1312 Dysphagia, oropharyngeal phase: Secondary | ICD-10-CM | POA: Diagnosis present

## 2022-07-29 DIAGNOSIS — N281 Cyst of kidney, acquired: Secondary | ICD-10-CM | POA: Diagnosis not present

## 2022-07-29 DIAGNOSIS — R1084 Generalized abdominal pain: Secondary | ICD-10-CM | POA: Diagnosis not present

## 2022-07-29 DIAGNOSIS — I4891 Unspecified atrial fibrillation: Secondary | ICD-10-CM | POA: Diagnosis not present

## 2022-07-29 DIAGNOSIS — K250 Acute gastric ulcer with hemorrhage: Secondary | ICD-10-CM | POA: Diagnosis not present

## 2022-07-29 DIAGNOSIS — Z01818 Encounter for other preprocedural examination: Secondary | ICD-10-CM | POA: Insufficient documentation

## 2022-07-29 DIAGNOSIS — T380X5A Adverse effect of glucocorticoids and synthetic analogues, initial encounter: Secondary | ICD-10-CM | POA: Diagnosis not present

## 2022-07-29 DIAGNOSIS — K5730 Diverticulosis of large intestine without perforation or abscess without bleeding: Secondary | ICD-10-CM | POA: Diagnosis not present

## 2022-07-29 DIAGNOSIS — E861 Hypovolemia: Secondary | ICD-10-CM | POA: Diagnosis present

## 2022-07-29 DIAGNOSIS — Z0189 Encounter for other specified special examinations: Secondary | ICD-10-CM

## 2022-07-29 DIAGNOSIS — A419 Sepsis, unspecified organism: Secondary | ICD-10-CM | POA: Diagnosis not present

## 2022-07-29 DIAGNOSIS — R591 Generalized enlarged lymph nodes: Secondary | ICD-10-CM | POA: Diagnosis not present

## 2022-07-29 DIAGNOSIS — Z1152 Encounter for screening for COVID-19: Secondary | ICD-10-CM | POA: Diagnosis not present

## 2022-07-29 DIAGNOSIS — J9811 Atelectasis: Secondary | ICD-10-CM | POA: Diagnosis not present

## 2022-07-29 DIAGNOSIS — R6521 Severe sepsis with septic shock: Secondary | ICD-10-CM | POA: Diagnosis not present

## 2022-07-29 DIAGNOSIS — R519 Headache, unspecified: Secondary | ICD-10-CM | POA: Diagnosis not present

## 2022-07-29 DIAGNOSIS — J988 Other specified respiratory disorders: Secondary | ICD-10-CM | POA: Diagnosis not present

## 2022-07-29 DIAGNOSIS — E872 Acidosis, unspecified: Secondary | ICD-10-CM | POA: Diagnosis present

## 2022-07-29 DIAGNOSIS — R5381 Other malaise: Secondary | ICD-10-CM | POA: Diagnosis not present

## 2022-07-29 DIAGNOSIS — D4620 Refractory anemia with excess of blasts, unspecified: Secondary | ICD-10-CM | POA: Diagnosis not present

## 2022-07-29 DIAGNOSIS — R140 Abdominal distension (gaseous): Secondary | ICD-10-CM | POA: Diagnosis not present

## 2022-07-29 DIAGNOSIS — M7989 Other specified soft tissue disorders: Secondary | ICD-10-CM | POA: Diagnosis not present

## 2022-07-29 DIAGNOSIS — D696 Thrombocytopenia, unspecified: Secondary | ICD-10-CM | POA: Diagnosis not present

## 2022-07-29 DIAGNOSIS — Z9079 Acquired absence of other genital organ(s): Secondary | ICD-10-CM

## 2022-07-29 DIAGNOSIS — T17908A Unspecified foreign body in respiratory tract, part unspecified causing other injury, initial encounter: Secondary | ICD-10-CM | POA: Diagnosis present

## 2022-07-29 HISTORY — DX: Urinary tract infection, site not specified: N39.0

## 2022-07-29 HISTORY — DX: Personal history of pulmonary embolism: Z86.711

## 2022-07-29 HISTORY — DX: Paroxysmal atrial fibrillation: I48.0

## 2022-07-29 HISTORY — DX: Myelodysplastic syndrome, unspecified: D46.9

## 2022-07-29 LAB — LACTIC ACID, PLASMA
Lactic Acid, Venous: 5.1 mmol/L (ref 0.5–1.9)
Lactic Acid, Venous: 5 mmol/L (ref 0.5–1.9)
Lactic Acid, Venous: 3.7 mmol/L (ref 0.5–1.9)

## 2022-07-29 LAB — CBC WITH DIFFERENTIAL/PLATELET
Abs Immature Granulocytes: 0.24 10*3/uL — ABNORMAL HIGH (ref 0.00–0.07)
Basophils Absolute: 0 10*3/uL (ref 0.0–0.1)
Basophils Relative: 0 %
Eosinophils Absolute: 0 10*3/uL (ref 0.0–0.5)
Eosinophils Relative: 0 %
HCT: 20.1 % — ABNORMAL LOW (ref 39.0–52.0)
Hemoglobin: 6.4 g/dL — CL (ref 13.0–17.0)
Immature Granulocytes: 2 %
Lymphocytes Relative: 12 %
Lymphs Abs: 1.7 10*3/uL (ref 0.7–4.0)
MCH: 25.5 pg — ABNORMAL LOW (ref 26.0–34.0)
MCHC: 31.8 g/dL (ref 30.0–36.0)
MCV: 80.1 fL (ref 80.0–100.0)
Monocytes Absolute: 1.6 10*3/uL — ABNORMAL HIGH (ref 0.1–1.0)
Monocytes Relative: 12 %
Neutro Abs: 10.3 10*3/uL — ABNORMAL HIGH (ref 1.7–7.7)
Neutrophils Relative %: 74 %
Platelets: 109 10*3/uL — ABNORMAL LOW (ref 150–400)
RBC: 2.51 MIL/uL — ABNORMAL LOW (ref 4.22–5.81)
RDW: 21.7 % — ABNORMAL HIGH (ref 11.5–15.5)
WBC: 13.8 10*3/uL — ABNORMAL HIGH (ref 4.0–10.5)
nRBC: 3.5 % — ABNORMAL HIGH (ref 0.0–0.2)

## 2022-07-29 LAB — PROTIME-INR
INR: 1.5 — ABNORMAL HIGH (ref 0.8–1.2)
Prothrombin Time: 18.3 seconds — ABNORMAL HIGH (ref 11.4–15.2)

## 2022-07-29 LAB — COMPREHENSIVE METABOLIC PANEL
ALT: 40 U/L (ref 0–44)
AST: 33 U/L (ref 15–41)
Albumin: 2.3 g/dL — ABNORMAL LOW (ref 3.5–5.0)
Alkaline Phosphatase: 89 U/L (ref 38–126)
Anion gap: 14 (ref 5–15)
BUN: 28 mg/dL — ABNORMAL HIGH (ref 8–23)
CO2: 20 mmol/L — ABNORMAL LOW (ref 22–32)
Calcium: 8.1 mg/dL — ABNORMAL LOW (ref 8.9–10.3)
Chloride: 92 mmol/L — ABNORMAL LOW (ref 98–111)
Creatinine, Ser: 1.01 mg/dL (ref 0.61–1.24)
GFR, Estimated: >60 mL/min (ref >60)
Glucose, Bld: 97 mg/dL (ref 70–99)
Potassium: 4.4 mmol/L (ref 3.5–5.1)
Sodium: 126 mmol/L — ABNORMAL LOW (ref 135–145)
Total Bilirubin: 2 mg/dL — ABNORMAL HIGH (ref 0.3–1.2)
Total Protein: 5.4 g/dL — ABNORMAL LOW (ref 6.5–8.1)

## 2022-07-29 LAB — RESP PANEL BY RT-PCR (RSV, FLU A&B, COVID)  RVPGX2
Influenza A by PCR: NEGATIVE
Influenza B by PCR: NEGATIVE
Resp Syncytial Virus by PCR: NEGATIVE
SARS Coronavirus 2 by RT PCR: NEGATIVE

## 2022-07-29 LAB — URINALYSIS, W/ REFLEX TO CULTURE (INFECTION SUSPECTED)
Bacteria, UA: NONE SEEN
Bilirubin Urine: NEGATIVE
Glucose, UA: NEGATIVE mg/dL
Hgb urine dipstick: NEGATIVE
Ketones, ur: NEGATIVE mg/dL
Leukocytes,Ua: NEGATIVE
Nitrite: NEGATIVE
Protein, ur: NEGATIVE mg/dL
Specific Gravity, Urine: 1.011 (ref 1.005–1.030)
pH: 5 (ref 5.0–8.0)

## 2022-07-29 LAB — TYPE AND SCREEN: ABO/RH(D): O POS

## 2022-07-29 LAB — ECHOCARDIOGRAM COMPLETE
AR max vel: 2.77 cm2
AV Area VTI: 2.53 cm2
AV Area mean vel: 2.66 cm2
AV Mean grad: 10 mmHg
AV Peak grad: 18.5 mmHg
Ao pk vel: 2.15 m/s
Area-P 1/2: 4.49 cm2
Calc EF: 72.1 %
Est EF: >75
S' Lateral: 1.8 cm
Single Plane A2C EF: 74 %
Single Plane A4C EF: 72.6 %

## 2022-07-29 LAB — APTT: aPTT: 28 seconds (ref 24–36)

## 2022-07-29 LAB — PREPARE RBC (CROSSMATCH)

## 2022-07-29 LAB — CBG MONITORING, ED: Glucose-Capillary: 93 mg/dL (ref 70–99)

## 2022-07-29 MED ORDER — LACTATED RINGERS IV SOLN: INTRAVENOUS | Status: AC

## 2022-07-29 MED ORDER — VANCOMYCIN HCL 1750 MG/350ML IV SOLN
1750.0000 mg | Freq: Once | INTRAVENOUS | Status: AC
  Administered 2022-07-29: 1750 mg via INTRAVENOUS
  Filled 2022-07-29: qty 350

## 2022-07-29 MED ORDER — SODIUM CHLORIDE 0.9 % IV SOLN
2.0000 g | Freq: Once | INTRAVENOUS | Status: AC
  Administered 2022-07-29: 2 g via INTRAVENOUS
  Filled 2022-07-29: qty 12.5

## 2022-07-29 MED ORDER — MORPHINE SULFATE (PF) 2 MG/ML IV SOLN
2.0000 mg | INTRAVENOUS | Status: DC | PRN
  Administered 2022-07-29 – 2022-08-06 (×32): 2 mg via INTRAVENOUS
  Filled 2022-07-29 (×32): qty 1

## 2022-07-29 MED ORDER — LACTATED RINGERS IV BOLUS (SEPSIS)
1000.0000 mL | Freq: Once | INTRAVENOUS | Status: AC
  Administered 2022-07-29: 1000 mL via INTRAVENOUS

## 2022-07-29 MED ORDER — VANCOMYCIN HCL 1750 MG/350ML IV SOLN
1750.0000 mg | INTRAVENOUS | Status: DC
  Administered 2022-07-30 – 2022-07-31 (×2): 1750 mg via INTRAVENOUS
  Filled 2022-07-29 (×3): qty 350

## 2022-07-29 MED ORDER — VANCOMYCIN HCL IN DEXTROSE 1-5 GM/200ML-% IV SOLN: 1000.0000 mg | Freq: Once | INTRAVENOUS | Status: DC

## 2022-07-29 MED ORDER — FENTANYL CITRATE PF 50 MCG/ML IJ SOSY
50.0000 ug | PREFILLED_SYRINGE | Freq: Once | INTRAMUSCULAR | Status: AC
  Administered 2022-07-29: 50 ug via INTRAVENOUS
  Filled 2022-07-29: qty 1

## 2022-07-29 MED ORDER — SODIUM CHLORIDE 0.9 % IV SOLN
2.0000 g | Freq: Three times a day (TID) | INTRAVENOUS | Status: AC
  Administered 2022-07-30 – 2022-08-05 (×21): 2 g via INTRAVENOUS
  Filled 2022-07-29 (×22): qty 12.5

## 2022-07-29 MED ORDER — ACETAMINOPHEN 325 MG PO TABS
650.0000 mg | ORAL_TABLET | Freq: Once | ORAL | Status: AC
  Administered 2022-07-29: 650 mg via ORAL
  Filled 2022-07-29: qty 2

## 2022-07-29 MED ORDER — SODIUM CHLORIDE 0.9% IV SOLUTION: Freq: Once | INTRAVENOUS | Status: AC

## 2022-07-29 MED ORDER — METRONIDAZOLE 500 MG/100ML IV SOLN
500.0000 mg | Freq: Once | INTRAVENOUS | Status: AC
  Administered 2022-07-29: 500 mg via INTRAVENOUS
  Filled 2022-07-29: qty 100

## 2022-07-29 NOTE — H&P (Signed)
History and Physical    Patient: Tyler [name] RUE:[id number] DOB: 1950/08/14 DOA: 07/29/2022 DOS: the patient was seen and examined on 07/29/2022 PCP: Nechama Guard, FNP  Patient coming from: Home  Chief Complaint:  Chief Complaint  Patient presents with   Near Syncope   Loss of Consciousness   HPI: Tyler [name] is a 72 y.o. 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  Patient has been doing fine until September or October last year when he was seen with pancytopenia.  At that time workup was initiated and was found to have mild dysplastic syndrome.  Following that patient was transfused and was having bimonthly follow-up with oncology.  In March of this year his condition transformed into follicular B-cell lymphoma diffusely.  Since then he has been followed up again by oncology.  Patient is being prepped for possible chemotherapy.  He has not had a Port-A-Cath yet which would have been followed by the chemo.  While in the process today he was having fever near syncope and was brought in by daughter.  He apparently has lost appetite for a long time currently not eating or drinking much.  In the ER patient was found to be Febrile and meets sepsis criteria.  He is also hypotensive on arrival with a blood pressure 85/73.  Lactic acid more than 5.  At this point he has anemia as well as thrombocytopenia.  He is being admitted for further evaluation and treatment.  Review of Systems: As mentioned in the history of present illness. All other systems reviewed and are negative. Past Medical History:  Diagnosis Date   Cancer [facility])    prostate   Hypertension     Past Surgical History:  Procedure Laterality Date   LYMPHADENECTOMY Bilateral 09/01/2019   Procedure: LYMPHADENECTOMY;  Surgeon: Sebastian Ache, MD;  Location: [facility];  Service: Urology;  Laterality: Bilateral;   ROBOT ASSISTED LAPAROSCOPIC RADICAL PROSTATECTOMY N/A 09/01/2019   Procedure: XI ROBOTIC ASSISTED LAPAROSCOPIC RADICAL PROSTATECTOMY;  Surgeon: Sebastian Ache, MD;  Location: [facility];  Service: Urology;  Laterality: N/A;  3 HRS   Social History:  reports that he has never smoked. He has never used smokeless tobacco. He reports current alcohol use. He reports that he does not use drugs.  Allergies  Allergen Reactions   Amitriptyline Other (See Comments)    Nightmares, Night sweats, Migraines    Amlodipine Swelling    BILATERAL FEET TO ANKLES   Neurontin [Gabapentin] Other (See Comments)    Nightmares; Night sweats; Headache    Family History  Problem Relation Age of Onset   Stroke Father     Prior to Admission medications   Medication Sig Start Date End Date Taking? Authorizing Provider  allopurinol (ZYLOPRIM) 100 MG tablet Take 1 tablet (100 mg total) by mouth 2 (two) times daily. 07/19/22  Yes Johney Maine, MD  B Complex Vitamins (VITAMIN B COMPLEX) TABS Take 1 tablet by mouth daily. 12/21/21  Yes Amin, Loura Halt, MD  cholecalciferol (VITAMIN D3) 25 MCG (1000 UNIT) tablet Take 2 tablets (2,000 Units total) by mouth daily. 01/21/22  Yes Johney Maine, MD  diclofenac Sodium (VOLTAREN) 1 % GEL APPLY 4 G TOPICALLY 4 (FOUR) TIMES DAILY. APPLY  TO RIGHT LATERAL HIP AND THIGH DOWN TO KNEE Patient taking differently: Apply 4 g topically daily as needed (For pain). 06/12/22  Yes Nestor Ramp, MD  folic acid (FOLVITE) 1 MG tablet Take 1 mg by mouth daily. 03/27/22  Yes [provider]  lidocaine-prilocaine (EMLA) cream Apply to affected area once Patient taking differently: Apply 1 Application topically daily as needed (For port). 07/26/22  Yes Johney Maine, MD  lisinopril (ZESTRIL) 5 MG tablet Take 5 mg by mouth daily.   Yes [provider]  Menthol-Methyl Salicylate (MUSCLE RUB) 10-15 % CREA Apply 1 Application topically daily as needed for muscle pain.   Yes [provider]  metoCLOPramide (REGLAN) 10 MG tablet Take 1 tablet (10 mg total) by mouth every 6 (six) hours as needed for nausea. 07/15/22  Yes Gerhard Munch, MD  Multiple Vitamin (MULTIVITAMIN WITH MINERALS) TABS tablet Take 1 tablet by mouth daily. 12/21/21  Yes Amin, Ankit Chirag, MD  ondansetron (ZOFRAN) 8 MG tablet Take 1 tablet (8 mg total) by mouth every 8 (eight) hours as needed for nausea or vomiting. Start on the third day after cyclophosphamide chemotherapy. 07/26/22  Yes Johney Maine, MD  oxyCODONE (OXY IR/ROXICODONE) 5 MG immediate release tablet Take 1 tablet (5 mg total) by mouth every 6 (six) hours as needed for severe pain. 07/19/22  Yes Johney Maine, MD  prochlorperazine (COMPAZINE) 10 MG tablet Take 1 tablet (10 mg total) by mouth every 6 (six) hours as needed for nausea or vomiting. 07/26/22  Yes Johney Maine, MD  senna-docusate (SENNA S) 8.6-50 MG tablet Take 2 tablets by mouth at bedtime as needed for mild constipation. 07/19/22  Yes Johney Maine, MD  acyclovir (ZOVIRAX) 400 MG tablet Take 1 tablet (400 mg total) by mouth daily. Patient not taking: Reported on 07/29/2022 07/26/22   Johney Maine, MD  allopurinol (ZYLOPRIM) 100 MG tablet Take 1 tablet (100 mg total) by mouth 2 (two) times daily. Patient not taking: Reported on 07/29/2022 07/26/22   Johney Maine, MD  amitriptyline (ELAVIL) 10 MG tablet TAKE 1 TABLET BY MOUTH EVERYDAY AT BEDTIME Patient not taking: Reported on 07/29/2022 07/23/22   Nestor Ramp, MD  gabapentin (NEURONTIN) 100 MG capsule Take 1 capsule (100 mg total) by mouth at bedtime for 14 days, THEN 2 capsules (200 mg total) at bedtime. 05/31/22 07/14/22  Claudie Leach, DO   predniSONE (DELTASONE) 20 MG tablet Take 3 tablets (60 mg total) by mouth daily. Take with food on days 2-6 of chemotherapy. Patient not taking: Reported on 07/29/2022 07/26/22   Johney Maine, MD    Physical Exam: Vitals:   07/29/22 1730 07/29/22 1735 07/29/22 1800 07/29/22 1844  BP:  121/79 131/71   Pulse: (!) 102 (!) 101 (!) 101   Resp: (!) 21 (!) 26 17   Temp:    97.9 F (36.6 C)  TempSrc:    Oral  SpO2: 99% 98% 98%   Weight:       Constitutional: Acutely ill looking no distress, comfortable Eyes: PERRL, lids and conjunctivae normal ENMT: Mucous membranes are moist. Posterior pharynx clear of any exudate or lesions.Normal dentition.  Neck: normal, supple, no masses, no thyromegaly Respiratory: clear to auscultation bilaterally, no wheezing, no crackles. Normal respiratory effort. No accessory muscle use.  Cardiovascular: Sinus tachycardia, no murmurs / rubs / gallops. No extremity edema. 2+ pedal pulses. No carotid bruits.  Abdomen: no tenderness, no masses palpated. No hepatosplenomegaly.  Bowel sounds positive.  Musculoskeletal: Good range of motion, no joint swelling or tenderness, Skin: no rashes, lesions, ulcers. No induration Neurologic: CN 2-12 grossly intact. Sensation intact, DTR normal. Strength 5/5 in all 4.  Psychiatric: Normal judgment and insight. Alert and oriented x 3. Normal mood  Data Reviewed:  Temperature 100.7, blood pressure 85/73, pulse 190 respirate 28 oxygen sat 96% on room air.  White count 13.8 hemoglobin 6.4 and platelets 109.  Sodium 126 potassium 4.4 chloride 92 CO2 20 BUN 28 creatinine 1.01 calcium 8.1.Initial lactic acid 5.0.  Urinalysis is so far negative.  CT abdomen pelvis showed no acute findings.  Diffuse colonic diverticulosis without acute diverticulitis.  He has numerous lymph nodes and compression fracture of the L4 vertebral body.  Head CT without contrast showed no acute findings.  Blood cultures obtained and patient being admitted  with severe sepsis.  Assessment and Plan:  #1 severe sepsis of unknown cause: Patient appears to have sepsis at this point.  Meets the criteria with no source identified.  Will admit the patient.  Aggressively hydrate.  IV antibiotics with Vanco and cefepime.  Follow culture results.  #2 severe anemia: Most likely due to his lymphoma.  Will transfuse 2 units of packed red blood cells and monitor.  #3 thrombocytopenia: Again related to his malignancy more than likely.  Continue to monitor.  #4 anorexia: Most likely due to cancer.  Initiate Megace.  #5 paroxysmal atrial fibrillation: No anticoagulation.  Rate control.  #6 history of prostate cancer: Stable.  Not on any treatment.  #7 hyponatremia: Hydrate with saline.  #8 diffuse large B-cell lymphoma: Continue follow-up with oncology.  #9 essential hypertension: Continue home regimen.  #10 bilateral pulmonary emboli history: Holding anticoagulation.    Advance Care Planning:   Code Status: Full Code   Consults: None but oncology consult in the morning  Family Communication: Daughter at bedside  Severity of Illness: The appropriate patient status for this patient is INPATIENT. Inpatient status is judged to be reasonable and necessary in order to provide the required intensity of service to ensure the patient's safety. The patient's presenting symptoms, physical exam findings, and initial radiographic and laboratory data in the context of their chronic comorbidities is felt to place them at high risk for further clinical deterioration. Furthermore, it is not anticipated that the patient will be medically stable for discharge from the hospital within 2 midnights of admission.   * I certify that at the point of admission it is my clinical judgment that the patient will require inpatient hospital care spanning beyond 2 midnights from the point of admission due to high intensity of service, high risk for further deterioration and high  frequency of surveillance required.*  AuthorLonia Blood, MD 07/29/2022 8:49 PM  For on call review [email or web address].

## 2022-07-29 NOTE — Progress Notes (Signed)
Pharmacy Antibiotic Note  Tyler [name] is a 72 y.o. male admitted on 07/29/2022 with sepsis.  Pharmacy has been consulted for vanc/cefepime dosing.  Plan: Vanc 1750mg  IV q24 - goal AUC 400-550 Cefepime 2g IV q8  Weight: 83.9 kg (185 lb)  Temp (24hrs), Avg:99 F (37.2 C), Min:97.9 F (36.6 C), Max:100.7 F (38.2 C)  Recent Labs  Lab 07/29/22 1520 07/29/22 1720 07/29/22 2010  WBC 13.8*  --   --   CREATININE 1.01  --   --   LATICACIDVEN 5.1* 5.0* 3.7*    Estimated Creatinine Clearance: 70.5 mL/min (by C-G formula based on SCr of 1.01 mg/dL).    Allergies  Allergen Reactions   Amitriptyline Other (See Comments)    Nightmares, Night sweats, Migraines    Amlodipine Swelling    BILATERAL FEET TO ANKLES   Neurontin [Gabapentin] Other (See Comments)    Nightmares; Night sweats; Headache     Thank you for allowing pharmacy to be a part of this patient's care.  Berkley Harvey 07/29/2022 9:07 PM

## 2022-07-29 NOTE — ED Provider Notes (Signed)
[place] EMERGENCY DEPARTMENT AT [facility] Provider Note   CSN: [id number] Arrival date & time: 07/29/22  1439     History  Chief Complaint  Patient presents with   Near Syncope   Loss of Consciousness    Keshav Showman is a 72 y.o. male.  72 year old male presents after having syncopal event while at home this evening.  Patient has history lymphoma.  Patient states that he has been feeling weak for several days and has chronic abdominal discomfort.  Has had emesis that was described as nonbilious or bloody.  No chest pain associate with this event.  He was not short of breath.  Denies any dysuria but does note some urinary urgency.  No syncopal event was witnessed today by family who states that he was only out for a few seconds slightly confused when he awoke.  No bowel or bladder dysfunction.  EMS called and patient blood pressure was 70/40 heart rate 130.  Given for an cc of fluids.  CBG was 119.       Home Medications Prior to Admission medications   Medication Sig Start Date End Date Taking? Authorizing Provider  acyclovir (ZOVIRAX) 400 MG tablet Take 1 tablet (400 mg total) by mouth daily. 07/26/22   Johney Maine, MD  allopurinol (ZYLOPRIM) 100 MG tablet Take 1 tablet (100 mg total) by mouth 2 (two) times daily. 07/19/22   Johney Maine, MD  allopurinol (ZYLOPRIM) 100 MG tablet Take 1 tablet (100 mg total) by mouth 2 (two) times daily. 07/26/22   Johney Maine, MD  amitriptyline (ELAVIL) 10 MG tablet TAKE 1 TABLET BY MOUTH EVERYDAY AT BEDTIME 07/23/22   Nestor Ramp, MD  B Complex Vitamins (VITAMIN B COMPLEX) TABS Take 1 tablet by mouth daily. 12/21/21   Amin, Loura Halt, MD  cholecalciferol (VITAMIN D3) 25 MCG (1000 UNIT) tablet Take 2 tablets (2,000 Units total) by mouth daily. 01/21/22   Johney Maine, MD  diclofenac Sodium (VOLTAREN) 1 % GEL APPLY 4 G TOPICALLY 4 (FOUR) TIMES DAILY. APPLY TO RIGHT LATERAL HIP AND THIGH DOWN TO KNEE  06/12/22   Nestor Ramp, MD  gabapentin (NEURONTIN) 100 MG capsule Take 1 capsule (100 mg total) by mouth at bedtime for 14 days, THEN 2 capsules (200 mg total) at bedtime. 05/31/22 07/14/22  Claudie Leach, DO  lidocaine-prilocaine (EMLA) cream Apply to affected area once 07/26/22   Johney Maine, MD  lisinopril (ZESTRIL) 5 MG tablet Take 5 mg by mouth daily.    [provider]  Menthol-Methyl Salicylate (MUSCLE RUB) 10-15 % CREA Apply 1 Application topically daily as needed for muscle pain.    [provider]  metoCLOPramide (REGLAN) 10 MG tablet Take 1 tablet (10 mg total) by mouth every 6 (six) hours as needed for nausea. 07/15/22   Gerhard Munch, MD  Multiple Vitamin (MULTIVITAMIN WITH MINERALS) TABS tablet Take 1 tablet by mouth daily. 12/21/21   Amin, Loura Halt, MD  ondansetron (ZOFRAN) 8 MG tablet Take 1 tablet (8 mg total) by mouth every 8 (eight) hours as needed for nausea or vomiting. Start on the third day after cyclophosphamide chemotherapy. 07/26/22   Johney Maine, MD  oxyCODONE (OXY IR/ROXICODONE) 5 MG immediate release tablet Take 1 tablet (5 mg total) by mouth every 6 (six) hours as needed for severe pain. 07/19/22   Johney Maine, MD  predniSONE (DELTASONE) 20 MG tablet Take 3 tablets (60 mg total) by mouth daily.  Take with food on days 2-6 of chemotherapy. 07/26/22   Johney Maine, MD  prochlorperazine (COMPAZINE) 10 MG tablet Take 1 tablet (10 mg total) by mouth every 6 (six) hours as needed for nausea or vomiting. 07/26/22   Johney Maine, MD  senna-docusate (SENNA S) 8.6-50 MG tablet Take 2 tablets by mouth at bedtime as needed for mild constipation. 07/19/22   Johney Maine, MD      Allergies    Amitriptyline, Amlodipine, and Neurontin [gabapentin]    Review of Systems   Review of Systems  All other systems reviewed and are negative.   Physical Exam Updated Vital Signs BP (!) 85/73   Pulse (!) 118   Temp (!)  100.7 F (38.2 C) (Oral)   Resp (!) 22   Wt 83.9 kg   SpO2 98%   BMI 28.98 kg/m  Physical Exam Vitals and nursing note reviewed.  Constitutional:      General: He is not in acute distress.    Appearance: Normal appearance. He is well-developed. He is not toxic-appearing.  HENT:     Head: Normocephalic and atraumatic.  Eyes:     General: Lids are normal.     Conjunctiva/sclera: Conjunctivae normal.     Pupils: Pupils are equal, round, and reactive to light.  Neck:     Thyroid: No thyroid mass.     Trachea: No tracheal deviation.  Cardiovascular:     Rate and Rhythm: Normal rate and regular rhythm.     Heart sounds: Normal heart sounds. No murmur heard.    No gallop.  Pulmonary:     Effort: Pulmonary effort is normal. No respiratory distress.     Breath sounds: Normal breath sounds. No stridor. No decreased breath sounds, wheezing, rhonchi or rales.  Abdominal:     General: There is no distension.     Palpations: Abdomen is soft.     Tenderness: There is generalized abdominal tenderness. There is no guarding or rebound.  Musculoskeletal:        General: No tenderness. Normal range of motion.     Cervical back: Normal range of motion and neck supple.  Skin:    General: Skin is warm and dry.     Findings: No abrasion or rash.  Neurological:     Mental Status: He is alert and oriented to person, place, and time. Mental status is at baseline.     GCS: GCS eye subscore is 4. GCS verbal subscore is 5. GCS motor subscore is 6.     Cranial Nerves: No cranial nerve deficit.     Sensory: No sensory deficit.     Motor: Motor function is intact.  Psychiatric:        Attention and Perception: Attention normal.        Speech: Speech normal.        Behavior: Behavior normal.     ED Results / Procedures / Treatments   Labs (all labs ordered are listed, but only abnormal results are displayed) Labs Reviewed  CULTURE, BLOOD (ROUTINE X 2)  CULTURE, BLOOD (ROUTINE X 2)  RESP PANEL  BY RT-PCR (RSV, FLU A&B, COVID)  RVPGX2  URINALYSIS, ROUTINE W REFLEX MICROSCOPIC  CBC WITH DIFFERENTIAL/PLATELET  COMPREHENSIVE METABOLIC PANEL  LACTIC ACID, PLASMA  LACTIC ACID, PLASMA  PROTIME-INR  APTT  URINALYSIS, W/ REFLEX TO CULTURE (INFECTION SUSPECTED)  CBG MONITORING, ED    EKG EKG Interpretation  Date/Time:  Monday July 29 2022 14:58:45 EDT Ventricular Rate:  115 PR Interval:  133 QRS Duration: 82 QT Interval:  304 QTC Calculation: 421 R Axis:   45 Text Interpretation: Sinus tachycardia Abnormal R-wave progression, early transition Confirmed by Lorre Nick ([id number]) on 07/29/2022 3:21:07 PM  Radiology No results found.  Procedures Procedures    Medications Ordered in ED Medications  acetaminophen (TYLENOL) tablet 650 mg (has no administration in time range)  lactated ringers infusion (has no administration in time range)  lactated ringers bolus 1,000 mL (has no administration in time range)    And  lactated ringers bolus 1,000 mL (has no administration in time range)    And  lactated ringers bolus 1,000 mL (has no administration in time range)  ceFEPIme (MAXIPIME) 2 g in sodium chloride 0.9 % 100 mL IVPB (has no administration in time range)  metroNIDAZOLE (FLAGYL) IVPB 500 mg (has no administration in time range)  vancomycin (VANCOCIN) IVPB 1000 mg/200 mL premix (has no administration in time range)    ED Course/ Medical Decision Making/ A&P                             Medical Decision Making Amount and/or Complexity of Data Reviewed Labs: ordered. Radiology: ordered. ECG/medicine tests: ordered.  Risk OTC drugs. Prescription drug management. Decision regarding hospitalization.   Patient is EKG per my interpretation shows sinus tachycardia.  He was febrile here and given Tylenol.  Patient was initially hypotensive responded well to IV fluids.  Sepsis protocol initiated.  Given 30/kg of lactated Ringer's.  Also started on empiric antibiotics.   Blood pressure greatly improved.  He is mental status remained stable.  His daughter served as interpreter per patient's request.  Chest x-ray and urinalysis per interpretation showed no obvious etiology of his potential sepsis.  Abdominal CT performed due to his abdominal pain which has been ongoing but he stated was worse today.  Results reviewed and no clear etiology.  Plan will be for hospitalization as patient is critically ill.  Discussed with hospitalist who has agreed to accept the patient  CRITICAL CARE Performed by: Toy Baker Total critical care time: 60 minutes Critical care time was exclusive of separately billable procedures and treating other patients. Critical care was necessary to treat or prevent imminent or life-threatening deterioration. Critical care was time spent personally by me on the following activities: development of treatment plan with patient and/or surrogate as well as nursing, discussions with consultants, evaluation of patient's response to treatment, examination of patient, obtaining history from patient or surrogate, ordering and performing treatments and interventions, ordering and review of laboratory studies, ordering and review of radiographic studies, pulse oximetry and re-evaluation of patient's condition.         Final Clinical Impression(s) / ED Diagnoses Final diagnoses:  None    Rx / DC Orders ED Discharge Orders     None         Lorre Nick, MD 07/29/22 1939

## 2022-07-29 NOTE — Sepsis Progress Note (Signed)
Sepsis protocol is being followed by [facility]. 

## 2022-07-29 NOTE — ED Triage Notes (Addendum)
BIB [facility] from home for witnessed syncope w/o fall. Was having trouble walking up stairs at home, got to 2nd floor and sat down with subsequent witnessed syncope while sitting, no fall. Son present. Also c/o lower abd pain and upper BLE pain. Was hypotensive for EMS, initial BP 70/40 with 130 HR, improved to 100/60 and 112 HR after 500cc NS IVF bolus. CBG 119. NSL 20g L wrist. Speaks Swahili and some English.

## 2022-07-29 NOTE — ED Notes (Signed)
Post blood vitals documented at 2115. Pt did not have a change in physical condition or vitals. Pt declined sob, fever, nausea, vomiting or itchiness.

## 2022-07-29 NOTE — Progress Notes (Signed)
A consult was received from an ED physician for Vancomycin and Cefepime per pharmacy dosing.  The patient's profile has been reviewed for ht/wt/allergies/indication/available labs.    A one time order has been placed for Vancomycin 1750mg  IV and Cefepime 2g IV.  Further antibiotics/pharmacy consults should be ordered by admitting physician if indicated.                       Thank you, Jamse Mead 07/29/2022  3:20 PM

## 2022-07-29 NOTE — Sepsis Progress Note (Signed)
Notified bedside nurse of need to draw repeat lactic acid @ 1920 per sepsis protocol since the last LA was >4.

## 2022-07-29 NOTE — ED Notes (Signed)
Urinal at bedside.  

## 2022-07-29 NOTE — ED Notes (Signed)
ED TO INPATIENT HANDOFF REPORT  ED Nurse Name and Phone #: Linus Orn Name/Age/Gender Tyler [name] 72 y.o. male Room/Bed: WA10/WA10  Code Status   Code Status: Prior  Home/SNF/Other Home Patient oriented to: self, place, time, and situation Is this baseline? Yes   Triage Complete: Triage complete  Chief Complaint Sepsis [facility]) [A41.9]  Triage Note BIB [facility] from home for witnessed syncope w/o fall. Was having trouble walking up stairs at home, got to 2nd floor and sat down with subsequent witnessed syncope while sitting, no fall. Son present. Also c/o lower abd pain and upper BLE pain. Was hypotensive for EMS, initial BP 70/40 with 130 HR, improved to 100/60 and 112 HR after 500cc NS IVF bolus. CBG 119. NSL 20g L wrist. Speaks Swahili and some English.    Allergies Allergies  Allergen Reactions   Amitriptyline Other (See Comments)    Nightmares, Night sweats, Migraines    Amlodipine Swelling    BILATERAL FEET TO ANKLES   Neurontin [Gabapentin] Other (See Comments)    Nightmares; Night sweats; Headache    Level of Care/Admitting Diagnosis ED Disposition     ED Disposition  Admit   Condition  --   Comment  Hospital [facility] [[id number]]  Level of Care: Progressive [102]  Admit to Progressive based on following criteria: MULTISYSTEM THREATS such as stable sepsis, metabolic/electrolyte imbalance with or without encephalopathy that is responding to early treatment.  May admit patient to [facility] or [facility] if equivalent level of care is available:: No  Covid Evaluation: Asymptomatic - no recent exposure (last 10 days) testing not required  Diagnosis: Sepsis [facility]) [[id number]]  Admitting Physician: Rometta Emery [[id number]]  Attending Physician: Rometta Emery [[id number]]  Certification:: I certify this patient will need inpatient services for at least 2 midnights  Estimated Length of Stay: 4          B Medical/Surgery History Past  Medical History:  Diagnosis Date   Cancer [facility])    prostate   Hypertension    Past Surgical History:  Procedure Laterality Date   LYMPHADENECTOMY Bilateral 09/01/2019   Procedure: LYMPHADENECTOMY;  Surgeon: Sebastian Ache, MD;  Location: [facility];  Service: Urology;  Laterality: Bilateral;   ROBOT ASSISTED LAPAROSCOPIC RADICAL PROSTATECTOMY N/A 09/01/2019   Procedure: XI ROBOTIC ASSISTED LAPAROSCOPIC RADICAL PROSTATECTOMY;  Surgeon: Sebastian Ache, MD;  Location: [facility];  Service: Urology;  Laterality: N/A;  3 HRS     A IV Location/Drains/Wounds Patient Lines/Drains/Airways Status     Active Line/Drains/Airways     Name Placement date Placement time Site Days   Peripheral IV 07/29/22 20 G Left Wrist 07/29/22  1453  Wrist  less than 1   Peripheral IV 07/29/22 18 G 1" Left;Posterior Forearm 07/29/22  1539  Forearm  less than 1   Incision - 6 Ports Abdomen 1: Left;Lateral;Lower 2: Left;Lateral;Upper 3: Umbilicus;Superior 4: Right;Lateral;Lower 5: Right;Medial;Upper 6: Right;Lateral;Upper 09/01/19  --  -- 1062   Wound / Incision (Open or Dehisced) 12/14/21 Puncture Hip Left Bone Marrow Bx puncture site 12/14/21  0914  Hip  227            Intake/Output Last 24 hours  Intake/Output Summary (Last 24 hours) at 07/29/2022 1955 Last data filed at 07/29/2022 1908 Gross per 24 hour  Intake 3550 ml  Output 300 ml  Net 3250 ml    Labs/Imaging Results for orders placed or performed during the hospital encounter of 07/29/22 (from the past 48 hour(s))  CBG monitoring, ED     Status: None   Collection Time: 07/29/22  3:16 PM  Result Value Ref Range   Glucose-Capillary 93 70 - 99 mg/dL    Comment: Glucose reference range applies only to samples taken after fasting for at least 8 hours.  CBC with Differential     Status: Abnormal   Collection Time: 07/29/22  3:20 PM  Result Value Ref Range   WBC 13.8 (H) 4.0 - 10.5 K/uL   RBC 2.51 (L) 4.22 - 5.81 MIL/uL   Hemoglobin 6.4 (LL) 13.0 - 17.0  g/dL    Comment: REPEATED TO VERIFY THIS CRITICAL RESULT HAS VERIFIED AND BEEN CALLED TO P.DOWD, RN BY NATHAN THOMPSON ON 06 03 2024 AT 1628, AND HAS BEEN READ BACK. CRITICAL RESULT VERIFIED    HCT 20.1 (L) 39.0 - 52.0 %   MCV 80.1 80.0 - 100.0 fL   MCH 25.5 (L) 26.0 - 34.0 pg   MCHC 31.8 30.0 - 36.0 g/dL   RDW 16.1 (H) 09.6 - 04.5 %   Platelets 109 (L) 150 - 400 K/uL    Comment: Immature Platelet Fraction may be clinically indicated, consider ordering this additional test [id number]    nRBC 3.5 (H) 0.0 - 0.2 %   Neutrophils Relative % 74 %   Neutro Abs 10.3 (H) 1.7 - 7.7 K/uL   Lymphocytes Relative 12 %   Lymphs Abs 1.7 0.7 - 4.0 K/uL   Monocytes Relative 12 %   Monocytes Absolute 1.6 (H) 0.1 - 1.0 K/uL   Eosinophils Relative 0 %   Eosinophils Absolute 0.0 0.0 - 0.5 K/uL   Basophils Relative 0 %   Basophils Absolute 0.0 0.0 - 0.1 K/uL   RBC Morphology NRBC'S PRESENT    Immature Granulocytes 2 %   Abs Immature Granulocytes 0.24 (H) 0.00 - 0.07 K/uL   Polychromasia PRESENT    Stomatocytes PRESENT     Comment: Performed at [facility], [address] [place], [place] [address]  Comprehensive metabolic panel     Status: Abnormal   Collection Time: 07/29/22  3:20 PM  Result Value Ref Range   Sodium 126 (L) 135 - 145 mmol/L   Potassium 4.4 3.5 - 5.1 mmol/L   Chloride 92 (L) 98 - 111 mmol/L   CO2 20 (L) 22 - 32 mmol/L   Glucose, Bld 97 70 - 99 mg/dL    Comment: Glucose reference range applies only to samples taken after fasting for at least 8 hours.   BUN 28 (H) 8 - 23 mg/dL   Creatinine, Ser 5.62 0.61 - 1.24 mg/dL   Calcium 8.1 (L) 8.9 - 10.3 mg/dL   Total Protein 5.4 (L) 6.5 - 8.1 g/dL   Albumin 2.3 (L) 3.5 - 5.0 g/dL   AST 33 15 - 41 U/L   ALT 40 0 - 44 U/L   Alkaline Phosphatase 89 38 - 126 U/L   Total Bilirubin 2.0 (H) 0.3 - 1.2 mg/dL   GFR, Estimated >13 >08 mL/min    Comment: (NOTE) Calculated using the CKD-EPI Creatinine Equation (2021)     Anion gap 14 5 - 15    Comment: Performed at [facility], [address], [place], [place] [address]  Lactic acid, plasma     Status: Abnormal   Collection Time: 07/29/22  3:20 PM  Result Value Ref Range   Lactic Acid, Venous 5.1 (HH) 0.5 - 1.9 mmol/L    Comment: CRITICAL RESULT CALLED TO, READ  BACK BY AND VERIFIED WITH RN P DOWD AT 1605 07/29/22 CRUICKSHANK A Performed at [facility], [address] [place], [place] [address]   Protime-INR     Status: Abnormal   Collection Time: 07/29/22  3:20 PM  Result Value Ref Range   Prothrombin Time 18.3 (H) 11.4 - 15.2 seconds   INR 1.5 (H) 0.8 - 1.2    Comment: (NOTE) INR goal varies based on device and disease states. Performed at [facility], [address] [place], [place] [address]   APTT     Status: None   Collection Time: 07/29/22  3:20 PM  Result Value Ref Range   aPTT 28 24 - 36 seconds    Comment: Performed at [facility], [address] [place], [place] [address]  Blood culture (routine x 2)     Status: None (Preliminary result)   Collection Time: 07/29/22  3:40 PM   Specimen: BLOOD LEFT FOREARM  Result Value Ref Range   Specimen Description      BLOOD LEFT FOREARM Performed at [facility], [address] [place], [place] [address]    Special Requests      BOTTLES DRAWN AEROBIC AND ANAEROBIC Blood Culture adequate volume Performed at [facility], [address], [place], [place] [address]    Culture PENDING    Report Status PENDING   Resp panel by RT-PCR (RSV, Flu A&B, Covid) Anterior Nasal Swab     Status: None   Collection Time: 07/29/22  3:44 PM   Specimen: Anterior Nasal Swab  Result Value Ref Range   SARS Coronavirus 2 by RT PCR NEGATIVE NEGATIVE    Comment: (NOTE) SARS-CoV-2 target nucleic acids are NOT DETECTED.  The SARS-CoV-2 RNA is generally detectable in upper respiratory specimens during the acute  phase of infection. The lowest concentration of SARS-CoV-2 viral copies this assay can detect is 138 copies/mL. A negative result does not preclude SARS-Cov-2 infection and should not be used as the sole basis for treatment or other patient management decisions. A negative result may occur with  improper specimen collection/handling, submission of specimen other than nasopharyngeal swab, presence of viral mutation(s) within the areas targeted by this assay, and inadequate number of viral copies(<138 copies/mL). A negative result must be combined with clinical observations, patient history, and epidemiological information. The expected result is Negative.  Fact Sheet for Patients:  BloggerCourse.com  Fact Sheet for Healthcare Providers:  SeriousBroker.it  This test is no t yet approved or cleared by the [place] FDA and  has been authorized for detection and/or diagnosis of SARS-CoV-2 by FDA under an Emergency Use Authorization (EUA). This EUA will remain  in effect (meaning this test can be used) for the duration of the COVID-19 declaration under Section 564(b)(1) of the Act, 21 U.S.C.section 360bbb-3(b)(1), unless the authorization is terminated  or revoked sooner.       Influenza A by PCR NEGATIVE NEGATIVE   Influenza B by PCR NEGATIVE NEGATIVE    Comment: (NOTE) The Xpert Xpress SARS-CoV-2/FLU/RSV plus assay is intended as an aid in the diagnosis of influenza from Nasopharyngeal swab specimens and should not be used as a sole basis for treatment. Nasal washings and aspirates are unacceptable for Xpert Xpress SARS-CoV-2/FLU/RSV testing.  Fact Sheet for Patients: BloggerCourse.com  Fact Sheet for Healthcare Providers: SeriousBroker.it  This test is not yet approved or cleared by the [place] FDA and has been authorized for detection and/or diagnosis  of SARS-CoV-2  by FDA under an Emergency Use Authorization (EUA). This EUA will remain in effect (meaning this test can be used) for the duration of the COVID-19 declaration under Section 564(b)(1) of the Act, 21 U.S.C. section 360bbb-3(b)(1), unless the authorization is terminated or revoked.     Resp Syncytial Virus by PCR NEGATIVE NEGATIVE    Comment: (NOTE) Fact Sheet for Patients: BloggerCourse.com  Fact Sheet for Healthcare Providers: SeriousBroker.it  This test is not yet approved or cleared by the [place] FDA and has been authorized for detection and/or diagnosis of SARS-CoV-2 by FDA under an Emergency Use Authorization (EUA). This EUA will remain in effect (meaning this test can be used) for the duration of the COVID-19 declaration under Section 564(b)(1) of the Act, 21 U.S.C. section 360bbb-3(b)(1), unless the authorization is terminated or revoked.  Performed at [facility], [address], [place], [place] [address]   Type and screen [facility]     Status: None (Preliminary result)   Collection Time: 07/29/22  5:08 PM  Result Value Ref Range   ABO/RH(D) O POS    Antibody Screen NEG    Sample Expiration 08/01/2022,2359    Unit Number [id number]    Blood Component Type RED CELLS,LR    Unit division 00    Status of Unit ALLOCATED    Transfusion Status OK TO TRANSFUSE    Crossmatch Result      Compatible Performed at [facility], [address], [place], [place] [address]   Prepare RBC (crossmatch)     Status: None   Collection Time: 07/29/22  5:08 PM  Result Value Ref Range   Order Confirmation      ORDER PROCESSED BY BLOOD BANK Performed at [facility], [address] [place] [address], [place], [place] [address]   Lactic acid, plasma     Status: Abnormal   Collection Time: 07/29/22  5:20 PM  Result Value Ref Range   Lactic Acid, Venous 5.0 (HH) 0.5 -  1.9 mmol/L    Comment: CRITICAL VALUE NOTED. VALUE IS CONSISTENT WITH PREVIOUSLY REPORTED/CALLED VALUE Performed at [facility], [address], [place], [place] [address]   Urinalysis, w/ Reflex to Culture (Infection Suspected) -Urine, Clean Catch     Status: None   Collection Time: 07/29/22  5:25 PM  Result Value Ref Range   Specimen Source URINE, CLEAN CATCH    Color, Urine YELLOW YELLOW   APPearance CLEAR CLEAR   Specific Gravity, Urine 1.011 1.005 - 1.030   pH 5.0 5.0 - 8.0   Glucose, UA NEGATIVE NEGATIVE mg/dL   Hgb urine dipstick NEGATIVE NEGATIVE   Bilirubin Urine NEGATIVE NEGATIVE   Ketones, ur NEGATIVE NEGATIVE mg/dL   Protein, ur NEGATIVE NEGATIVE mg/dL   Nitrite NEGATIVE NEGATIVE   Leukocytes,Ua NEGATIVE NEGATIVE   RBC / HPF 0-5 0 - 5 RBC/hpf   WBC, UA 0-5 0 - 5 WBC/hpf    Comment:        Reflex urine culture not performed if WBC <=10, OR if Squamous epithelial cells >5. If Squamous epithelial cells >5 suggest recollection.    Bacteria, UA NONE SEEN NONE SEEN   Squamous Epithelial / HPF 0-5 0 - 5 /HPF   Mucus PRESENT    Hyaline Casts, UA PRESENT     Comment: Performed at [facility], [address] [place], [place] [address]   CT ABDOMEN PELVIS WO CONTRAST  Result Date: 07/29/2022 CLINICAL DATA:  Acute abdominal pain. EXAM: CT ABDOMEN AND PELVIS WITHOUT CONTRAST TECHNIQUE: Multidetector CT imaging of the abdomen and pelvis was performed following the standard protocol without IV contrast. RADIATION DOSE REDUCTION: This exam was performed according to the departmental dose-optimization program which includes automated exposure control, adjustment of the mA and/or kV according to patient size and/or use of iterative reconstruction technique. COMPARISON:  CT examination dated Jul 15, 2022 FINDINGS: Lower chest: Bibasilar subsegmental atelectasis. Hepatobiliary: No focal liver abnormality is seen. No gallstones, gallbladder wall  thickening, or biliary dilatation. Pancreas: Mild generalized pancreatic atrophy. No pancreatic ductal dilatation or surrounding inflammatory changes. Spleen: Normal in size without focal abnormality. Adrenals/Urinary Tract: Adrenal glands are unremarkable. Kidneys are normal, without renal calculi, focal lesion, or hydronephrosis. Bilateral exophytic renal cysts, likely simple cysts. Bladder is unremarkable. Stomach/Bowel: Stomach is within normal limits. Appendix appears normal. Scattered colonic diverticulosis without evidence of acute diverticulitis. No evidence of bowel wall thickening, distention, or inflammatory changes. Vascular/Lymphatic: No significant vascular findings are present. There numerous lymph nodes of multiple sizes which are para-aortic, mesenteric, porta hepatis as well as multiple lymph nodes about the spleen as well as few lymph nodes in the pelvis. These are not significantly changed since prior examination. Reproductive: Postsurgical changes in the prostate bed. Other: Scattered soft tissue densities in the anterior abdominal wall, which may represent lymph nodes. No abdominopelvic ascites. Musculoskeletal: Compression fracture of the L4 vertebral body is unchanged. No acute osseous abnormality. IMPRESSION: 1. No CT evidence of acute abdominal/pelvic process. 2. Scattered colonic diverticulosis without evidence of acute diverticulitis. 3. Numerous lymph nodes of multiple sizes are present throughout the abdomen and pelvis predominantly in the mesentery and para-aortic region, which are not significantly changed since prior examination. 4. Chronic compression fracture of the L4 vertebral body. Electronically Signed   By: Larose Hires D.O.   On: 07/29/2022 19:26   CT Head Wo Contrast  Result Date: 07/29/2022 CLINICAL DATA:  Seizures, new onset. EXAM: CT HEAD WITHOUT CONTRAST TECHNIQUE: Contiguous axial images were obtained from the base of the skull through the vertex without intravenous  contrast. RADIATION DOSE REDUCTION: This exam was performed according to the departmental dose-optimization program which includes automated exposure control, adjustment of the mA and/or kV according to patient size and/or use of iterative reconstruction technique. COMPARISON:  None Available. FINDINGS: Brain: No evidence of acute infarction, hemorrhage, hydrocephalus, extra-axial collection or mass lesion/mass effect. Vascular: No hyperdense vessel or unexpected calcification. Skull: Normal. Negative for fracture or focal lesion. Sinuses/Orbits: Mucosal thickening of the ethmoid air cells. Other: None. IMPRESSION: 1. No acute intracranial abnormality. 2. Mucosal thickening of the ethmoid air cells. Electronically Signed   By: Larose Hires D.O.   On: 07/29/2022 19:15   ECHOCARDIOGRAM COMPLETE  Result Date: 07/29/2022    ECHOCARDIOGRAM REPORT   Patient Name:   Tyler [name] Date of Exam: 07/29/2022 Medical Rec #:  [id number]    Height:       67.0 in Accession #:    [id number]   Weight:       185.9 lb Date of Birth:  01-Sep-1950    BSA:          1.960 m Patient Age:    72 years     BP:           111/73 mmHg Patient Gender: M            HR:           117 bpm. Exam Location:  Outpatient  Procedure: 2D Echo, Color Doppler and Cardiac Doppler Indications:    Chemo  History:        Patient has prior history of Echocardiogram examinations, most                 recent 12/16/2021. Risk Factors:Hypertension. Prostate Cancer,                 Lymphoma, Chemotherapy.  Sonographer:    Milbert Coulter Referring Phys: Johney Maine  Sonographer Comments: Image acquisition challenging due to respiratory motion and Tachycardia. IMPRESSIONS  1. Intracavitary gradient is realted to dynamic function. Left ventricular ejection fraction, by estimation, is >75%. The left ventricle has hyperdynamic function. The left ventricle has no regional wall motion abnormalities. There is mild concentric left ventricular hypertrophy. Left ventricular  diastolic parameters were normal.  2. Right ventricular systolic function is hyperdynamic. The right ventricular size is normal. Tricuspid regurgitation signal is inadequate for assessing PA pressure.  3. The mitral valve is normal in structure. No evidence of mitral valve regurgitation.  4. The aortic valve is tricuspid. There is mild calcification of the aortic valve. Aortic valve regurgitation is not visualized. No aortic stenosis is present. Comparison(s): No significant change from prior study. FINDINGS  Left Ventricle: Intracavitary gradient is realted to dynamic function. Left ventricular ejection fraction, by estimation, is >75%. The left ventricle has hyperdynamic function. The left ventricle has no regional wall motion abnormalities. The left ventricular internal cavity size was small. There is mild concentric left ventricular hypertrophy. Left ventricular diastolic parameters were normal. Right Ventricle: The right ventricular size is normal. No increase in right ventricular wall thickness. Right ventricular systolic function is hyperdynamic. Tricuspid regurgitation signal is inadequate for assessing PA pressure. Left Atrium: Left atrial size was normal in size. Right Atrium: Right atrial size was normal in size. Pericardium: Trivial pericardial effusion is present. The pericardial effusion is posterior to the left ventricle. Mitral Valve: The mitral valve is normal in structure. No evidence of mitral valve regurgitation. Tricuspid Valve: The tricuspid valve is normal in structure. Tricuspid valve regurgitation is not demonstrated. Aortic Valve: The aortic valve is tricuspid. There is mild calcification of the aortic valve. Aortic valve regurgitation is not visualized. No aortic stenosis is present. Aortic valve mean gradient measures 10.0 mmHg. Aortic valve peak gradient measures 18.5 mmHg. Aortic valve area, by VTI measures 2.53 cm. Pulmonic Valve: The pulmonic valve was not well visualized. Pulmonic  valve regurgitation is not visualized. Aorta: The aortic root and ascending aorta are structurally normal, with no evidence of dilitation. IAS/Shunts: The interatrial septum was not well visualized.  LEFT VENTRICLE PLAX 2D LVIDd:         4.00 cm     Diastology LVIDs:         1.80 cm     LV e' medial:    8.24 cm/s LV PW:         1.10 cm     LV E/e' medial:  7.5 LV IVS:        1.20 cm     LV e' lateral:   11.70 cm/s LVOT diam:     2.10 cm     LV E/e' lateral: 5.3 LV SV:         72 LV SV Index:   37 LVOT Area:     3.46 cm  LV Volumes (MOD) LV vol d, MOD A2C: 27.2 ml LV vol d, MOD A4C: 49.7 ml LV vol s, MOD A2C: 7.1 ml  LV vol s, MOD A4C: 13.6 ml LV SV MOD A2C:     20.1 ml LV SV MOD A4C:     49.7 ml LV SV MOD BP:      26.5 ml RIGHT VENTRICLE TAPSE (M-mode): 2.0 cm LEFT ATRIUM             Index LA diam:        3.50 cm 1.79 cm/m LA Vol (A2C):   26.6 ml 13.57 ml/m LA Vol (A4C):   36.9 ml 18.82 ml/m LA Biplane Vol: 31.3 ml 15.97 ml/m  AORTIC VALVE AV Area (Vmax):    2.77 cm AV Area (Vmean):   2.66 cm AV Area (VTI):     2.53 cm AV Vmax:           215.00 cm/s AV Vmean:          146.000 cm/s AV VTI:            0.285 m AV Peak Grad:      18.5 mmHg AV Mean Grad:      10.0 mmHg LVOT Vmax:         172.00 cm/s LVOT Vmean:        112.000 cm/s LVOT VTI:          0.208 m LVOT/AV VTI ratio: 0.73  AORTA Ao Asc diam: 2.90 cm MITRAL VALVE MV Area (PHT): 4.49 cm    SHUNTS MV Decel Time: 169 msec    Systemic VTI:  0.21 m MV E velocity: 61.90 cm/s  Systemic Diam: 2.10 cm MV A velocity: 89.20 cm/s MV E/A ratio:  0.69 Riley Lam MD Electronically signed by Riley Lam MD Signature Date/Time: 07/29/2022/4:01:01 PM    Final    DG Chest Port 1 View  Result Date: 07/29/2022 CLINICAL DATA:  Cough.  Syncope. EXAM: PORTABLE CHEST 1 VIEW COMPARISON:  May 23, 2022.  Jul 07, 2022. FINDINGS: Prominence of the superior mediastinum is noted consistent with adenopathy as noted on prior CT scan. Stable cardiac size. Lungs are  clear. Bony thorax is unremarkable. IMPRESSION: Widening of superior mediastinum consistent with adenopathy noted on prior CT scan of Jul 07, 2022. This is concerning for lymphoma or metastatic disease. Electronically Signed   By: Lupita Raider M.D.   On: 07/29/2022 15:34    Pending Labs Unresulted Labs (From admission, onward)     Start     Ordered   07/29/22 1920  Lactic acid, plasma  Once,   STAT        07/29/22 1831   07/29/22 1506  Blood culture (routine x 2)  BLOOD CULTURE X 2,   R (with STAT occurrences)      07/29/22 1505            Vitals/Pain Today's Vitals   07/29/22 1735 07/29/22 1736 07/29/22 1800 07/29/22 1844  BP: 121/79  131/71   Pulse: (!) 101  (!) 101   Resp: (!) 26  17   Temp:    97.9 F (36.6 C)  TempSrc:    Oral  SpO2: 98%  98%   Weight:      PainSc:  6       Isolation Precautions No active isolations  Medications Medications  lactated ringers infusion ( Intravenous New Bag/Given 07/29/22 1543)  0.9 %  sodium chloride infusion (Manually program via Guardrails IV Fluids) (has no administration in time range)  acetaminophen (TYLENOL) tablet 650 mg (650 mg Oral Given 07/29/22 1554)  lactated ringers bolus 1,000  mL (0 mLs Intravenous Stopped 07/29/22 1701)    And  lactated ringers bolus 1,000 mL (0 mLs Intravenous Stopped 07/29/22 1701)    And  lactated ringers bolus 1,000 mL (0 mLs Intravenous Stopped 07/29/22 1751)  ceFEPIme (MAXIPIME) 2 g in sodium chloride 0.9 % 100 mL IVPB (0 g Intravenous Stopped 07/29/22 1628)  metroNIDAZOLE (FLAGYL) IVPB 500 mg (0 mg Intravenous Stopped 07/29/22 1701)  vancomycin (VANCOREADY) IVPB 1750 mg/350 mL (0 mg Intravenous Stopped 07/29/22 1908)  fentaNYL (SUBLIMAZE) injection 50 mcg (50 mcg Intravenous Given 07/29/22 1704)    Mobility Weakness, can stand with assistance.     Focused Assessments Cardiac Assessment Handoff:  Cardiac Rhythm: Sinus tachycardia Lab Results  Component Value Date   CKTOTAL 25 (L) 12/10/2021    Lab Results  Component Value Date   DDIMER 5.52 (H) 12/11/2021   Does the Patient currently have chest pain? No    R Recommendations: See Admitting Provider Note  Report given to:   Additional Notes: AAOx4, can stand with assistance, daughter at bedside.

## 2022-07-30 ENCOUNTER — Other Ambulatory Visit: Payer: Self-pay

## 2022-07-30 ENCOUNTER — Inpatient Hospital Stay (HOSPITAL_COMMUNITY): Payer: 59

## 2022-07-30 DIAGNOSIS — A419 Sepsis, unspecified organism: Secondary | ICD-10-CM | POA: Diagnosis not present

## 2022-07-30 DIAGNOSIS — R6520 Severe sepsis without septic shock: Secondary | ICD-10-CM | POA: Diagnosis not present

## 2022-07-30 LAB — COMPREHENSIVE METABOLIC PANEL
ALT: 34 U/L (ref 0–44)
AST: 39 U/L (ref 15–41)
Albumin: 2.3 g/dL — ABNORMAL LOW (ref 3.5–5.0)
Alkaline Phosphatase: 89 U/L (ref 38–126)
Anion gap: 10 (ref 5–15)
BUN: 21 mg/dL (ref 8–23)
CO2: 20 mmol/L — ABNORMAL LOW (ref 22–32)
Calcium: 8 mg/dL — ABNORMAL LOW (ref 8.9–10.3)
Chloride: 97 mmol/L — ABNORMAL LOW (ref 98–111)
Creatinine, Ser: 0.77 mg/dL (ref 0.61–1.24)
GFR, Estimated: >60 mL/min (ref >60)
Glucose, Bld: 106 mg/dL — ABNORMAL HIGH (ref 70–99)
Potassium: 4.2 mmol/L (ref 3.5–5.1)
Sodium: 127 mmol/L — ABNORMAL LOW (ref 135–145)
Total Bilirubin: 1.8 mg/dL — ABNORMAL HIGH (ref 0.3–1.2)
Total Protein: 5.1 g/dL — ABNORMAL LOW (ref 6.5–8.1)

## 2022-07-30 LAB — CBC
HCT: 24.5 % — ABNORMAL LOW (ref 39.0–52.0)
Hemoglobin: 8.1 g/dL — ABNORMAL LOW (ref 13.0–17.0)
MCH: 26.6 pg (ref 26.0–34.0)
MCHC: 33.1 g/dL (ref 30.0–36.0)
MCV: 80.6 fL (ref 80.0–100.0)
Platelets: 110 10*3/uL — ABNORMAL LOW (ref 150–400)
RBC: 3.04 MIL/uL — ABNORMAL LOW (ref 4.22–5.81)
RDW: 20.8 % — ABNORMAL HIGH (ref 11.5–15.5)
WBC: 16 10*3/uL — ABNORMAL HIGH (ref 4.0–10.5)
nRBC: 1.3 % — ABNORMAL HIGH (ref 0.0–0.2)

## 2022-07-30 LAB — TYPE AND SCREEN: Antibody Screen: NEGATIVE

## 2022-07-30 LAB — BPAM RBC
Blood Product Expiration Date: 202407082359
Unit Type and Rh: 5100

## 2022-07-30 MED ORDER — LACTATED RINGERS IV SOLN: INTRAVENOUS | Status: AC

## 2022-07-30 MED ORDER — SODIUM CHLORIDE 0.9 % IV SOLN: INTRAVENOUS | Status: DC

## 2022-07-30 MED ORDER — ONDANSETRON HCL 4 MG/2ML IJ SOLN
4.0000 mg | Freq: Four times a day (QID) | INTRAMUSCULAR | Status: DC | PRN
  Administered 2022-07-30 – 2022-08-06 (×4): 4 mg via INTRAVENOUS
  Filled 2022-07-30 (×4): qty 2

## 2022-07-30 MED ORDER — GADOBUTROL 1 MMOL/ML IV SOLN
9.0000 mL | Freq: Once | INTRAVENOUS | Status: AC | PRN
  Administered 2022-07-30: 9 mL via INTRAVENOUS

## 2022-07-30 MED ORDER — QUETIAPINE FUMARATE 50 MG PO TABS
25.0000 mg | ORAL_TABLET | Freq: Every day | ORAL | Status: DC
  Administered 2022-07-30 – 2022-08-04 (×6): 25 mg via ORAL
  Filled 2022-07-30 (×6): qty 1

## 2022-07-30 MED ORDER — ENOXAPARIN SODIUM 40 MG/0.4ML IJ SOSY
40.0000 mg | PREFILLED_SYRINGE | INTRAMUSCULAR | Status: DC
  Administered 2022-07-30 – 2022-08-05 (×7): 40 mg via SUBCUTANEOUS
  Filled 2022-07-30 (×7): qty 0.4

## 2022-07-30 MED ORDER — ONDANSETRON HCL 4 MG PO TABS: 4.0000 mg | ORAL_TABLET | Freq: Four times a day (QID) | ORAL | Status: DC | PRN

## 2022-07-30 NOTE — TOC Initial Note (Addendum)
Transition of Care [facility]) - Initial/Assessment Note    Patient Details  Name: Tyler [name] MRN: [id number] Date of Birth: [birth year]  Transition of Care [facility]) CM/SW Contact:    Howell Rucks, RN Phone Number: 07/30/2022, 2:46 PM  Clinical Narrative:    Met with pt and spouse at bedside to introduce role of TOC/NCM and review for dc needs, pt and wife primary language is Systems developer, wife called dtr Tyler [name]) introduced self and reason for call. Tyler [name] confirmed pt has a PCP, pharmacy,  rw at home that he just recently started using, no home services, transport to be provided by family. Tyler [name] had no further questions/concerns. PT/OT eval pending, await recommendations. TOC will continue to follow.              Expected Discharge Plan: Home/Self Care Barriers to Discharge: Continued Medical Work up   Patient Goals and CMS Choice Patient states their goals for this hospitalization and ongoing recovery are:: Home with family          Expected Discharge Plan and Services                                              Prior Living Arrangements/Services   Lives with:: Spouse Patient language and need for interpreter reviewed:: Yes (intepreter needed-pt primary language-[place])                 Activities of Daily Living Home Assistive Devices/Equipment: Walker (specify type) ADL Screening (condition at time of admission) Patient's cognitive ability adequate to safely complete daily activities?: Yes Is the patient deaf or have difficulty hearing?: No Does the patient have difficulty seeing, even when wearing glasses/contacts?: No Does the patient have difficulty concentrating, remembering, or making decisions?: No Patient able to express need for assistance with ADLs?: Yes Does the patient have difficulty dressing or bathing?: No Independently performs ADLs?: No Communication: Independent Dressing (OT): Independent Grooming: Independent Feeding: Independent Bathing:  Independent Toileting: Independent In/Out Bed: Independent Walks in Home: Independent with device (comment) Does the patient have difficulty walking or climbing stairs?: Yes Weakness of Legs: Both Weakness of Arms/Hands: None  Permission Sought/Granted                  Emotional Assessment Appearance:: Appears stated age Attitude/Demeanor/Rapport: Gracious Affect (typically observed): Accepting Orientation: : Oriented to Self, Oriented to Place, Oriented to  Time, Oriented to Situation Alcohol / Substance Use: Not Applicable Psych Involvement: No (comment)  Admission diagnosis:  Syncope and collapse [R55] Sepsis (HCC) [A41.9] Patient Active Problem List   Diagnosis Date Noted   Diffuse large B-cell lymphoma of lymph nodes of multiple regions (HCC) 07/25/2022   Encounter for antineoplastic chemotherapy 07/25/2022   Lumbar radiculopathy 05/31/2022   Symptomatic anemia    UTI (urinary tract infection) 12/10/2021   Sepsis (HCC) 12/10/2021   Normocytic anemia 12/10/2021   Thrombocytopenia (HCC) 12/10/2021   Hyponatremia 12/10/2021   Sepsis secondary to UTI (HCC) 12/10/2021   Pelvic hematoma in male 09/08/2019   Bilateral pulmonary embolism (HCC) 09/05/2019   Paroxysmal atrial fibrillation (HCC) 09/05/2019   Hypertension    Hypokalemia    Prostate cancer (HCC) 09/01/2019   PCP:  Nechama Guard, FNP Pharmacy:   [facility] [phone] - [place], [place] - [address] AT [facility] [address] [place] [address] Phone: [phone] Fax:  [phone]  [facility] [address] [place] [address] Phone: [phone] Fax: [phone]     Social Determinants of Health (SDOH) Social History: SDOH Screenings   Food Insecurity: No Food Insecurity (07/30/2022)  Housing: Low Risk  (07/30/2022)  Transportation Needs: No Transportation Needs (07/30/2022)  Utilities: Not At Risk (07/30/2022)  Tobacco  Use: Low Risk  (07/29/2022)   SDOH Interventions:     Readmission Risk Interventions    07/30/2022    2:44 PM  Readmission Risk Prevention Plan  Transportation Screening Complete  PCP or Specialist Appt within 3-5 Days Complete  HRI or Home Care Consult Complete  Social Work Consult for Recovery Care Planning/Counseling Complete  Palliative Care Screening Not Applicable  Medication Review Oceanographer) Complete

## 2022-07-30 NOTE — Progress Notes (Signed)
[facility] PROGRESS NOTE  Tyler [name] (DOB: Jul 21, 1950) WUJ:[id number] PCP: Nechama Guard, FNP  Brief Narrative: Tyler [name] is a 72 y.o. 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 He was hypotensive, tachycardic, with lactic acidosis. Temperature 100.33F. Cultures were drawn, and broad IV antibiotics started for sepsis of unclear origin.    Subjective: He reports numbness but also pain on the right cheek and forehead for a couple days-weeks. No change with chewing but he hasn't been eating during this time either. His daughter, a FNP, is at the bedside and is very helpful with history taking. Pt was agitated overnight, reported night mares.   Objective: BP 132/83 (BP Location: Right Arm)   Pulse (!) 117   Temp 97.8 F (36.6 C) (Oral)   Resp (!) 22   Ht 5\' 7"  (1.702 m)   Wt 88 kg   SpO2 96%   BMI 30.39 kg/m   Gen: No distress, resting quietly.  HEENT: Broken posteriormost right maxillary molar with scattered gingivitis. Tenderness to palpation along right cheek and forehead without visible or palpable rash/vesicles.  Pulm: Clear, nonlabored  CV: Regular tachycardia without MRG or edema GI: Soft, NT, ND, +BS  Neuro: Alert and oriented. No new focal deficits. Ext: Warm, no deformities. Skin: No rashes, lesions or ulcers on visualized skin   Assessment & Plan: Principal Problem:   Sepsis (HCC) Active Problems:   Prostate cancer (HCC)   Hypertension   Paroxysmal atrial fibrillation (HCC)   Normocytic anemia   Hyponatremia   Diffuse large B-cell lymphoma of lymph nodes of multiple regions (HCC)  Sepsis, lactic acidosis: Still source unknown, but treating as sepsis in lymphoma patient. Despite initial clear lungs on CXR, he may have had aspiration with seizure activity - Continue  vancomycin, cefepime. No blood culture data returned yet.  - Continue IVF - Consider repeat CXR if pulmonary symptoms arise.   Seizure-like activity: Reported PTA with short postictal period as well.  - Given this and his known lymphoma, also with right-sided headache, will check MR brain  - Monitor closely with seizure precautions.   Right facial paresthesia, dysesthesia:  - Has broken tooth, might benefit from panorex to eval for dental abscess. Will check to see if this is available at [facility].  - MR brain as above.   AKI, hypovolemic hyponatremia: Improving.  - Still not taking any po, no edema on exam, echo on day of admission showed hyperdynamic LV. Continue LR infusion.   Follicular lymphoma:  - Follow up with Dr. Candise Che for chemotherapy initiation after port insertion. Port planned 6/10 per daughter.  - Due to disease burden he was recently placed on prednisone 60mg  daily x10 days, last dose was day of admission. This may contribute to leukocytosis.   Recurrent normocytic anemia: Suspected to be due to the lymphoma.  - 2u PRBCs given with response to hgb 8.1g/dl.  - Will monitor serially. Given his risk for VTE (malignancy and immobility), since there's no history or evidence of bleeding, we will give VTE ppx lovenox.   Thrombocytopenia: Stable. >100k.  - Monitor  Insomnia, anxiety, nightmares:  - Seroquel qHS  Hypoalbuminemia, malnutrition:  - RD consult.   Hyperbilirubinemia: Fractionate and monitor  History of prostate CA: s/p prostatectomy.   L4 compression fracture: Subacute-appearing, may be contributing to pain, though  his baseline significant spinal arthritis is certainly contributing as well.  - PT/OT  Tyrone Nine, MD [facility] [email or web address] 07/30/2022, 1:33 PM

## 2022-07-31 ENCOUNTER — Other Ambulatory Visit: Payer: Self-pay

## 2022-07-31 ENCOUNTER — Inpatient Hospital Stay (HOSPITAL_COMMUNITY): Payer: 59

## 2022-07-31 DIAGNOSIS — D649 Anemia, unspecified: Secondary | ICD-10-CM

## 2022-07-31 DIAGNOSIS — A419 Sepsis, unspecified organism: Secondary | ICD-10-CM | POA: Diagnosis not present

## 2022-07-31 DIAGNOSIS — I10 Essential (primary) hypertension: Secondary | ICD-10-CM | POA: Diagnosis not present

## 2022-07-31 DIAGNOSIS — C61 Malignant neoplasm of prostate: Secondary | ICD-10-CM

## 2022-07-31 DIAGNOSIS — C8338 Diffuse large B-cell lymphoma, lymph nodes of multiple sites: Secondary | ICD-10-CM | POA: Diagnosis not present

## 2022-07-31 LAB — MAGNESIUM: Magnesium: 1.8 mg/dL (ref 1.7–2.4)

## 2022-07-31 LAB — CBC
HCT: 21.9 % — ABNORMAL LOW (ref 39.0–52.0)
Hemoglobin: 6.8 g/dL — CL (ref 13.0–17.0)
MCH: 26.1 pg (ref 26.0–34.0)
MCHC: 31.1 g/dL (ref 30.0–36.0)
MCV: 83.9 fL (ref 80.0–100.0)
Platelets: 91 10*3/uL — ABNORMAL LOW (ref 150–400)
RBC: 2.61 MIL/uL — ABNORMAL LOW (ref 4.22–5.81)
RDW: 21.5 % — ABNORMAL HIGH (ref 11.5–15.5)
WBC: 12.2 10*3/uL — ABNORMAL HIGH (ref 4.0–10.5)
nRBC: 0.8 % — ABNORMAL HIGH (ref 0.0–0.2)

## 2022-07-31 LAB — COMPREHENSIVE METABOLIC PANEL
ALT: 29 U/L (ref 0–44)
AST: 38 U/L (ref 15–41)
Albumin: 2 g/dL — ABNORMAL LOW (ref 3.5–5.0)
Alkaline Phosphatase: 72 U/L (ref 38–126)
Anion gap: 11 (ref 5–15)
BUN: 16 mg/dL (ref 8–23)
CO2: 21 mmol/L — ABNORMAL LOW (ref 22–32)
Calcium: 7.9 mg/dL — ABNORMAL LOW (ref 8.9–10.3)
Chloride: 96 mmol/L — ABNORMAL LOW (ref 98–111)
Creatinine, Ser: 0.7 mg/dL (ref 0.61–1.24)
GFR, Estimated: >60 mL/min (ref >60)
Glucose, Bld: 82 mg/dL (ref 70–99)
Potassium: 4 mmol/L (ref 3.5–5.1)
Sodium: 128 mmol/L — ABNORMAL LOW (ref 135–145)
Total Bilirubin: 2.2 mg/dL — ABNORMAL HIGH (ref 0.3–1.2)
Total Protein: 4.6 g/dL — ABNORMAL LOW (ref 6.5–8.1)

## 2022-07-31 LAB — PHOSPHORUS: Phosphorus: 2.8 mg/dL (ref 2.5–4.6)

## 2022-07-31 LAB — PREPARE RBC (CROSSMATCH)

## 2022-07-31 LAB — TYPE AND SCREEN: Unit division: 0

## 2022-07-31 LAB — LACTIC ACID, PLASMA: Lactic Acid, Venous: 2.6 mmol/L (ref 0.5–1.9)

## 2022-07-31 LAB — BPAM RBC
Blood Product Expiration Date: 202407082359
ISSUE DATE / TIME: 202406032014

## 2022-07-31 MED ORDER — LIP MEDEX EX OINT
1.0000 | TOPICAL_OINTMENT | CUTANEOUS | Status: DC | PRN
  Filled 2022-07-31: qty 7

## 2022-07-31 MED ORDER — SODIUM CHLORIDE 0.9% IV SOLUTION: Freq: Once | INTRAVENOUS | Status: AC

## 2022-07-31 MED ORDER — BISACODYL 10 MG RE SUPP
10.0000 mg | Freq: Once | RECTAL | Status: AC
  Administered 2022-07-31: 10 mg via RECTAL
  Filled 2022-07-31: qty 1

## 2022-07-31 MED ORDER — ACETAMINOPHEN 500 MG PO TABS
1000.0000 mg | ORAL_TABLET | Freq: Four times a day (QID) | ORAL | Status: AC | PRN
  Administered 2022-07-31 – 2022-08-03 (×2): 1000 mg via ORAL
  Filled 2022-07-31 (×2): qty 2

## 2022-07-31 MED ORDER — KATE FARMS STANDARD 1.4 PO LIQD
325.0000 mL | Freq: Two times a day (BID) | ORAL | Status: DC
  Administered 2022-07-31 – 2022-08-04 (×4): 325 mL via ORAL
  Filled 2022-07-31 (×17): qty 325

## 2022-07-31 MED ORDER — SIMETHICONE 80 MG PO CHEW
80.0000 mg | CHEWABLE_TABLET | Freq: Four times a day (QID) | ORAL | Status: AC
  Administered 2022-07-31 (×4): 80 mg via ORAL
  Filled 2022-07-31 (×4): qty 1

## 2022-07-31 MED ORDER — SALINE SPRAY 0.65 % NA SOLN
1.0000 | NASAL | Status: DC | PRN
  Filled 2022-07-31: qty 44

## 2022-07-31 MED ORDER — ADULT MULTIVITAMIN W/MINERALS CH
1.0000 | ORAL_TABLET | Freq: Every day | ORAL | Status: DC
  Administered 2022-08-02 – 2022-08-04 (×3): 1 via ORAL
  Filled 2022-07-31 (×3): qty 1

## 2022-07-31 NOTE — Progress Notes (Signed)
Initial Nutrition Assessment  DOCUMENTATION CODES:   Not applicable  INTERVENTION:  - Liberalize to Regular diet to promote intake. [employer] 1.4 PO BID, each supplement provides 455 kcal and 20 grams protein. - Encourage intake as tolerated. - Multivitamin with minerals daily - Monitor weight trends.    NUTRITION DIAGNOSIS:   Increased nutrient needs related to chronic illness (lymphoma) as evidenced by estimated needs  GOAL:   Patient will meet greater than or equal to 90% of their needs  MONITOR:   PO intake, Supplement acceptance, Weight trends  REASON FOR ASSESSMENT:   Consult Poor PO  ASSESSMENT:   72 y.o. 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 Admitted for sepsis of unknown cause.   Patient resting in bed at time of visit. Wife and son-in-law at bedside. Son-in-law called patient's daughter who assisted in translation and provided most of patient's nutrition history.   UBW reported to be 180-185# and daughter does not feel patient has had any recent changes in weight. Per EMR, no significant changes in the past 6 months.   Patient was eating 3 meals a day at home but over the past 3 months (since cancer diagnosis) appetite has been poor. He has been eating only small meals during the day and son-in-law reports he would sometimes go days when he wouldn't eat anything. Has been drinking [employer] nutrition supplement drinks 1-2x/day at home.  Current appetite remains decreased. Daughter reports patient had very little to eat yesterday. He is documented to have had 50% of breakfast today. Family just ordered lunch prior to RD visit.  Discussed importance of trying to eat well with 3 meals a day. Open to trying [employer] and Ensure during admission, likes vanilla.  Will  also liberalize diet to promote intake. Daughter reports MD today mentioned possibly adding an appetite stimulant, not yet ordered at this time.   Medications reviewed and include: -  Labs reviewed:  Na 128   NUTRITION - FOCUSED PHYSICAL EXAM:  Flowsheet Row Most Recent Value  Orbital Region No depletion  Upper Arm Region No depletion  Thoracic and Lumbar Region No depletion  Buccal Region No depletion  Temple Region No depletion  Clavicle Bone Region No depletion  Clavicle and Acromion Bone Region No depletion  Scapular Bone Region Unable to assess  Dorsal Hand No depletion  Patellar Region No depletion  Anterior Thigh Region No depletion  Posterior Calf Region No depletion  Edema (RD Assessment) None  Hair Reviewed  Eyes Reviewed  Mouth Reviewed  Skin Reviewed  Nails Reviewed       Diet Order:   Diet Order             Diet Heart Room service appropriate? Yes; Fluid consistency: Thin  Diet effective now                   EDUCATION NEEDS:  Education needs have been addressed  Skin:  Skin Assessment: Reviewed RN Assessment  Last BM:  6/2  Height:  Ht Readings from Last 1 Encounters:  07/30/22 5\' 7"  (1.702 m)   Weight:  Wt Readings from Last 1 Encounters:  07/30/22 88 kg    BMI:  Body mass index is 30.39 kg/m.  Estimated Nutritional Needs:  Kcal:  1900-2100 kcals Protein:  100-115 grams Fluid:  >/=  1.9L    Shelle Iron RD, LDN For contact information, refer to [facility].

## 2022-07-31 NOTE — Evaluation (Signed)
Occupational Therapy Evaluation Patient Details Name: Tyler [name] MRN: [id number] DOB: June 17, 1950 Today's Date: 07/31/2022   History of Present Illness (Tyler [name] is a 72 yr old male admitted to the hospital w/ poor p.o. intake, abdominal pain, & near syncope. He was found to have sepsis & severe anemia.PMH: myelodysplastic syndrome, prostate CA, lymphoma dx March 2024, HTN, afib, PE, recurrent UTI)   Clinical Impression   The pt presented with reports of 10/10 bilateral thigh and back pain, as well as feelings of significant lower body weakness. As such, he stated he did not feel as though he could stand to attempt progressive out of bed activity. He required min assist for supine to sit & max assist for lower body dressing. He did not present with a loss of balance while seated EOB. He will benefit from further OT services to maximize his independence with self-care tasks and to decrease the risk for restricted participation in meaningful activities. OT anticipates his overall functional abilities and activity tolerance would be improved, if his pain is better controlled.      Recommendations for follow up therapy are one component of a multi-disciplinary discharge planning process, led by the attending physician.  Recommendations may be updated based on patient status, additional functional criteria and insurance authorization.   Assistance Recommended at Discharge Intermittent Supervision/Assistance  Patient can return home with the following Assistance with cooking/housework;Help with stairs or ramp for entrance;A lot of help with bathing/dressing/bathroom    Functional Status Assessment  Patient has had a recent decline in their functional status and demonstrates the ability to make significant improvements in function in a reasonable and predictable amount of time.  Equipment Recommendations  None recommended by OT       Precautions / Restrictions Precautions Precautions:  Fall Restrictions Weight Bearing Restrictions: No      Mobility Bed Mobility Overal bed mobility: Needs Assistance Bed Mobility: Supine to Sit, Sit to Supine     Supine to sit: Min assist Sit to supine: Supervision        Transfers          General transfer comment: unable to assess, as the pt declined to attempt, due to reports of BLE weakness and increased bilateral thigh pain; per his nurse, he was able to stand and pivot to the [facility] earlier today, when his pain was better controlled      Balance       Sitting balance - Comments: static sitting-good, dynamic sitting-fair to fair+       Standing balance comment: unable to assess                 ADL either performed or assessed with clinical judgement   ADL Overall ADL's : Needs assistance/impaired Eating/Feeding: Independent;Sitting   Grooming: Set up;Sitting           Upper Body Dressing : Set up;Sitting Upper Body Dressing Details (indicate cue type and reason): simulated seated EOB Lower Body Dressing: Maximal assistance Lower Body Dressing Details (indicate cue type and reason): He was unable to donn his socks without assist seated EOB, therefore requiring increased assist in this regard.                      Pertinent Vitals/Pain Pain Assessment Pain Assessment: 0-10 Pain Score: 10-Worst pain ever Pain Location: back and thighs Pain Intervention(s): Patient requesting pain meds-RN notified     Hand Dominance Left   Extremity/Trunk Assessment Upper Extremity Assessment Upper Extremity  Assessment: Overall WFL for tasks assessed   Lower Extremity Assessment Lower Extremity Assessment:  (He presented with subjective reports of BLE weakness.  BLE AROM appeared [facility] grossly)       Communication Communication Communication: Prefers language other than English (speaks Licensed conveyancer; video interpreter used, interpreter name Tyler [name], [place] [id number]))   Cognition Arousal/Alertness:  Awake/alert Behavior During Therapy: WFL for tasks assessed/performed Overall Cognitive Status: Within Functional Limits for tasks assessed        General Comments: Oriented x4, able to follow 1-2 step commands                Home Living Family/patient expects to be discharged to:: Private residence Living Arrangements: Spouse/significant other Available Help at Discharge: Family Type of Home: Apartment (2nd floor apartment) Home Access: Stairs to enter Secretary/administrator of Steps: flight   Home Layout: One level               Home Equipment: Agricultural consultant (2 wheels);BSC/3in1          Prior Functioning/Environment               Mobility Comments: He reported ambulating without an assistive device mostly, however he occasionally used a rolling walker. ADLs Comments: He reported requiring occasional assist from his spouse for self-care tasks.        OT Problem List: Decreased strength;Decreased activity tolerance;Decreased knowledge of use of DME or AE;Pain      OT Treatment/Interventions: Self-care/ADL training;Therapeutic exercise;Energy conservation;DME and/or AE instruction;Therapeutic activities;Patient/family education;Balance training    OT Goals(Current goals can be found in the care plan section) Acute Rehab OT Goals Patient Stated Goal: decreased pain OT Goal Formulation: With patient Time For Goal Achievement: 08/14/22 Potential to Achieve Goals: Good ADL Goals Pt Will Perform Grooming: with supervision;standing Pt Will Perform Lower Body Dressing: with supervision;sit to/from stand Pt Will Transfer to Toilet: with supervision;ambulating Pt Will Perform Toileting - Clothing Manipulation and hygiene: with supervision;sit to/from stand  OT Frequency: Min 1X/week       AM-PAC OT "6 Clicks" Daily Activity     Outcome Measure Help from another person eating meals?: None Help from another person taking care of personal grooming?: A  Little Help from another person toileting, which includes using toliet, bedpan, or urinal?: A Lot Help from another person bathing (including washing, rinsing, drying)?: A Lot Help from another person to put on and taking off regular upper body clothing?: None Help from another person to put on and taking off regular lower body clothing?: A Lot 6 Click Score: 17   End of Session Equipment Utilized During Treatment: Other (comment) (none) Nurse Communication: Patient requests pain meds  Activity Tolerance: Patient limited by pain Patient left: in bed;with call bell/phone within reach;with nursing/sitter in room;with family/visitor present  OT Visit Diagnosis: Pain;Muscle weakness (generalized) (M62.81) Pain - part of body:  (back and thighs)                Time: 9147-8295 OT Time Calculation (min): 21 min Charges:  OT General Charges $OT Visit: 1 Visit OT Evaluation $OT Eval Moderate Complexity: 1 Mod    Yamel Bale L Allyne Hebert, OTR/L 07/31/2022, 5:32 PM

## 2022-07-31 NOTE — Progress Notes (Signed)
PT Cancellation Note  Patient Details Name: Tyler [name] MRN: [id number] DOB: April 22, 1950   Cancelled Treatment:    Reason Eval/Treat Not Completed: Patient at procedure or test/unavailable (pt washing up with NT. Will follow.)   Tamala Ser PT 07/31/2022  Acute Rehabilitation Services  Office [phone]

## 2022-07-31 NOTE — Progress Notes (Signed)
PT Cancellation Note  Patient Details Name: Tyler [name] MRN: [id number] DOB: Aug 27, 1950   Cancelled Treatment:    Reason Eval/Treat Not Completed: Medical issues which prohibited therapy (Hgb 6.8, transfusion ordered. Will hold PT until after transfusion.)   Tamala Ser PT 07/31/2022  Acute Rehabilitation Services  Office [phone]

## 2022-07-31 NOTE — Progress Notes (Addendum)
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 intake, tachycardia and episode of shaking and unresponsiveness then confused thereafter.  He was hypotensive, tachycardic and had lactic acidosis.  Temperature 100.7.  Blood cultures were obtained and patient started on IV antibiotics for sepsis of unclear origin.    Assessment/Plan:   Lactic acidosis/sepsis -Unclear source, started on vancomycin and cefepime -Blood culture showed no growth till date -Received IV fluids, blood pressure is stable  Seizure-like activity -Reported prior to admission with short postictal. -MRI brain showed multifocal hyperintense T2 weighted signal within the white matter, most consistent with chronic small vessel ischemia -Will obtain EEG  Right facial paresthesia -CT maxillofacial showed large periapical lucency at the root of the most posterior remaining right maxillary tooth.  No abscess -Right anterior ethmoid and maxillary sinus complete opacification with polypoid continues, possibly antrochoanal polyp -Multiple enlarged submandibular lymph nodes measuring up to 1.6 cm, likely reactive  Acute kidney injury -Secondary to dehydration, resolved  Chronic hyponatremia -Sodium has been low for past 1 month -At baseline  Normocytic anemia -Likely in setting of lymphoma -S/p 2 units PRBC with hemoglobin up to 8.1 g/dL -Hemoglobin dropped to 6.8 this morning, will order 1 unit of PRBC -Follow H&H in a.m.  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 for chemotherapy initiation after port insertion -Due to disease burden he was placed on prednisone 60 mg daily for 10 days, last dose was on the day of admission  Thrombocytopenia -Platelet count is stable  Insomnia, anxiety, nightmares -Continue  Seroquel nightly  History of prostate cancer -S/p prostatectomy  L4 compression fracture -Subacute appearing -PT/OT     Medications     enoxaparin (LOVENOX) injection  40 mg Subcutaneous Q24H   QUEtiapine  25 mg Oral QHS     Data Reviewed:   CBG:  Recent Labs  Lab 07/29/22 1516  GLUCAP 93    SpO2: 93 %    Vitals:   07/30/22 2155 07/31/22 0509 07/31/22 0638 07/31/22 0655  BP: 135/80 108/62 120/61 (!) 140/73  Pulse: (!) 119 (!) 111 (!) 109 (!) 108  Resp: 18 19 20  (!) 22  Temp: 97.7 F (36.5 C) 99 F (37.2 C) 99.2 F (37.3 C) 98.9 F (37.2 C)  TempSrc: Oral Oral Oral Oral  SpO2: 100% 91%  93%  Weight:      Height:          Data Reviewed:  Basic Metabolic Panel: Recent Labs  Lab 07/29/22 1520 07/30/22 0410 07/31/22 0405  NA 126* 127* 128*  K 4.4 4.2 4.0  CL 92* 97* 96*  CO2 20* 20* 21*  GLUCOSE 97 106* 82  BUN 28* 21 16  CREATININE 1.01 0.77 0.70  CALCIUM 8.1* 8.0* 7.9*    CBC: Recent Labs  Lab 07/29/22 1520 07/30/22 0410 07/31/22 0405  WBC 13.8* 16.0* 12.2*  NEUTROABS 10.3*  --   --   HGB 6.4* 8.1* 6.8*  HCT 20.1* 24.5* 21.9*  MCV 80.1 80.6 83.9  PLT 109* 110* 91*    LFT Recent Labs  Lab 07/29/22 1520 07/30/22 0410 07/31/22 0405  AST 33 39 38  ALT 40 34 29  ALKPHOS 89 89 72  BILITOT 2.0* 1.8* 2.2*  PROT 5.4* 5.1* 4.6*  ALBUMIN 2.3* 2.3* 2.0*     Antibiotics:  Anti-infectives (From admission, onward)    Start     Dose/Rate Route Frequency Ordered Stop   07/30/22 1600  vancomycin (VANCOREADY) IVPB 1750 mg/350 mL        1,750 mg 175 mL/hr over 120 Minutes Intravenous Every 24 hours 07/29/22 2108     07/30/22 0000  ceFEPIme (MAXIPIME) 2 g in sodium chloride 0.9 % 100 mL IVPB        2 g 200 mL/hr over 30 Minutes Intravenous Every 8 hours 07/29/22 2108     07/29/22 1530  ceFEPIme (MAXIPIME) 2 g in sodium chloride 0.9 % 100 mL IVPB        2 g 200 mL/hr over 30 Minutes Intravenous  Once 07/29/22 1517 07/29/22 1628    07/29/22 1530  metroNIDAZOLE (FLAGYL) IVPB 500 mg        500 mg 100 mL/hr over 60 Minutes Intravenous  Once 07/29/22 1517 07/29/22 1701   07/29/22 1530  vancomycin (VANCOCIN) IVPB 1000 mg/200 mL premix  Status:  Discontinued        1,000 mg 200 mL/hr over 60 Minutes Intravenous  Once 07/29/22 1517 07/29/22 1520   07/29/22 1530  vancomycin (VANCOREADY) IVPB 1750 mg/350 mL        1,750 mg 175 mL/hr over 120 Minutes Intravenous  Once 07/29/22 1520 07/29/22 1908        DVT prophylaxis: Lovenox  Code Status: Full code  Family Communication:    CONSULTS    Subjective   Patient seen and examined, complains of constipation.  Bloating and abdominal.   Objective    Physical Examination:   General-appears in no acute distress Heart-S1-S2, regular, no murmur auscultated Lungs-clear to auscultation bilaterally, no wheezing or crackles auscultated Abdomen-soft, nontender, no organomegaly, abdominal distended Extremities-no edema in the lower extremities Neuro-alert, oriented x3, no focal deficit noted  Status is: Inpatient:             Lorin Hauck S Aayana Reinertsen   [facility] If 7PM-7AM, please contact night-coverage at [email or web address], Office  [phone]   07/31/2022, 8:17 AM  LOS: 2 days

## 2022-07-31 NOTE — Progress Notes (Signed)
Pharmacist Chemotherapy Monitoring - Initial Assessment    Anticipated start date: 08/07/22   The following has been reviewed per standard work regarding the patient's treatment regimen: The patient's diagnosis, treatment plan and drug doses, and organ/hematologic function Lab orders and baseline tests specific to treatment regimen  The treatment plan start date, drug sequencing, and pre-medications Prior authorization status  Patient's documented medication list, including drug-drug interaction screen and prescriptions for anti-emetics and supportive care specific to the treatment regimen The drug concentrations, fluid compatibility, administration routes, and timing of the medications to be used The patient's access for treatment and lifetime cumulative dose history, if applicable  The patient's medication allergies and previous infusion related reactions, if applicable   Changes made to treatment plan:  N/A  Follow up needed:  N/A   Tyler [name], RPH, 07/31/2022  2:23 PM

## 2022-08-01 ENCOUNTER — Inpatient Hospital Stay (HOSPITAL_COMMUNITY)
Admit: 2022-08-01 | Discharge: 2022-08-01 | Disposition: A | Discharge status: Home / Self Care | Payer: 59 | Attending: Family Medicine | Admitting: Family Medicine

## 2022-08-01 ENCOUNTER — Other Ambulatory Visit: Payer: 59

## 2022-08-01 ENCOUNTER — Inpatient Hospital Stay (HOSPITAL_COMMUNITY): Payer: 59

## 2022-08-01 DIAGNOSIS — R55 Syncope and collapse: Secondary | ICD-10-CM | POA: Diagnosis not present

## 2022-08-01 DIAGNOSIS — A419 Sepsis, unspecified organism: Secondary | ICD-10-CM | POA: Diagnosis not present

## 2022-08-01 DIAGNOSIS — R569 Unspecified convulsions: Secondary | ICD-10-CM | POA: Diagnosis not present

## 2022-08-01 DIAGNOSIS — D649 Anemia, unspecified: Secondary | ICD-10-CM | POA: Diagnosis not present

## 2022-08-01 DIAGNOSIS — I10 Essential (primary) hypertension: Secondary | ICD-10-CM | POA: Diagnosis not present

## 2022-08-01 DIAGNOSIS — C8338 Diffuse large B-cell lymphoma, lymph nodes of multiple sites: Secondary | ICD-10-CM | POA: Diagnosis not present

## 2022-08-01 LAB — TYPE AND SCREEN: Unit division: 0

## 2022-08-01 LAB — TROPONIN I (HIGH SENSITIVITY)
Troponin I (High Sensitivity): 12 ng/L (ref <18)
Troponin I (High Sensitivity): 14 ng/L (ref <18)

## 2022-08-01 LAB — COMPREHENSIVE METABOLIC PANEL
ALT: 34 U/L (ref 0–44)
AST: 45 U/L — ABNORMAL HIGH (ref 15–41)
Albumin: 2 g/dL — ABNORMAL LOW (ref 3.5–5.0)
Alkaline Phosphatase: 74 U/L (ref 38–126)
Anion gap: 9 (ref 5–15)
BUN: 22 mg/dL (ref 8–23)
CO2: 20 mmol/L — ABNORMAL LOW (ref 22–32)
Calcium: 7.6 mg/dL — ABNORMAL LOW (ref 8.9–10.3)
Chloride: 95 mmol/L — ABNORMAL LOW (ref 98–111)
Creatinine, Ser: 0.84 mg/dL (ref 0.61–1.24)
GFR, Estimated: >60 mL/min (ref >60)
Glucose, Bld: 112 mg/dL — ABNORMAL HIGH (ref 70–99)
Potassium: 3.6 mmol/L (ref 3.5–5.1)
Sodium: 124 mmol/L — ABNORMAL LOW (ref 135–145)
Total Bilirubin: 2 mg/dL — ABNORMAL HIGH (ref 0.3–1.2)
Total Protein: 4.7 g/dL — ABNORMAL LOW (ref 6.5–8.1)

## 2022-08-01 LAB — CULTURE, BLOOD (ROUTINE X 2)
Culture: NO GROWTH
Culture: NO GROWTH
Special Requests: ADEQUATE
Special Requests: ADEQUATE

## 2022-08-01 LAB — BPAM RBC
ISSUE DATE / TIME: 202406050629
Unit Type and Rh: 5100

## 2022-08-01 LAB — URINALYSIS, ROUTINE W REFLEX MICROSCOPIC
Bilirubin Urine: NEGATIVE
Glucose, UA: NEGATIVE mg/dL
Ketones, ur: NEGATIVE mg/dL
Leukocytes,Ua: NEGATIVE
Nitrite: NEGATIVE
Protein, ur: NEGATIVE mg/dL
Specific Gravity, Urine: 1.025 (ref 1.005–1.030)
pH: 5 (ref 5.0–8.0)

## 2022-08-01 LAB — TSH: TSH: 3.619 u[IU]/mL (ref 0.350–4.500)

## 2022-08-01 LAB — PROCALCITONIN: Procalcitonin: 0.47 ng/mL

## 2022-08-01 LAB — CBC
HCT: 24.7 % — ABNORMAL LOW (ref 39.0–52.0)
Hemoglobin: 8 g/dL — ABNORMAL LOW (ref 13.0–17.0)
MCH: 27 pg (ref 26.0–34.0)
MCHC: 32.4 g/dL (ref 30.0–36.0)
MCV: 83.4 fL (ref 80.0–100.0)
Platelets: 87 10*3/uL — ABNORMAL LOW (ref 150–400)
RBC: 2.96 MIL/uL — ABNORMAL LOW (ref 4.22–5.81)
RDW: 20.3 % — ABNORMAL HIGH (ref 11.5–15.5)
WBC: 10.2 10*3/uL (ref 4.0–10.5)
nRBC: 0.8 % — ABNORMAL HIGH (ref 0.0–0.2)

## 2022-08-01 LAB — MRSA NEXT GEN BY PCR, NASAL: MRSA by PCR Next Gen: NOT DETECTED

## 2022-08-01 LAB — OSMOLALITY, URINE: Osmolality, Ur: 702 mOsm/kg (ref 300–900)

## 2022-08-01 LAB — LACTIC ACID, PLASMA: Lactic Acid, Venous: 2.9 mmol/L (ref 0.5–1.9)

## 2022-08-01 LAB — GLUCOSE, CAPILLARY: Glucose-Capillary: 110 mg/dL — ABNORMAL HIGH (ref 70–99)

## 2022-08-01 MED ORDER — SODIUM CHLORIDE 0.9 % IV BOLUS: 250.0000 mL | Freq: Once | INTRAVENOUS | Status: DC

## 2022-08-01 MED ORDER — CHLORHEXIDINE GLUCONATE CLOTH 2 % EX PADS
6.0000 | MEDICATED_PAD | Freq: Every day | CUTANEOUS | Status: DC
  Administered 2022-08-01 – 2022-08-07 (×7): 6 via TOPICAL

## 2022-08-01 MED ORDER — METOPROLOL TARTRATE 5 MG/5ML IV SOLN
2.5000 mg | INTRAVENOUS | Status: AC | PRN
  Administered 2022-08-01 – 2022-08-02 (×2): 2.5 mg via INTRAVENOUS
  Filled 2022-08-01: qty 5

## 2022-08-01 MED ORDER — LACTATED RINGERS IV BOLUS
500.0000 mL | Freq: Once | INTRAVENOUS | Status: AC
  Administered 2022-08-01: 500 mL via INTRAVENOUS

## 2022-08-01 MED ORDER — AMIODARONE HCL IN DEXTROSE 360-4.14 MG/200ML-% IV SOLN
30.0000 mg/h | INTRAVENOUS | Status: DC
  Administered 2022-08-01 – 2022-08-02 (×4): 30 mg/h via INTRAVENOUS
  Filled 2022-08-01 (×3): qty 200

## 2022-08-01 MED ORDER — AMIODARONE LOAD VIA INFUSION
150.0000 mg | INTRAVENOUS | Status: AC
  Administered 2022-08-01: 150 mg via INTRAVENOUS
  Filled 2022-08-01: qty 83.34

## 2022-08-01 MED ORDER — SODIUM CHLORIDE 0.9 % IV BOLUS
500.0000 mL | Freq: Once | INTRAVENOUS | Status: AC
  Administered 2022-08-01: 500 mL via INTRAVENOUS

## 2022-08-01 MED ORDER — POTASSIUM CHLORIDE 20 MEQ PO PACK
40.0000 meq | PACK | Freq: Once | ORAL | Status: AC
  Administered 2022-08-01: 40 meq via ORAL
  Filled 2022-08-01: qty 2

## 2022-08-01 MED ORDER — AMIODARONE HCL IN DEXTROSE 360-4.14 MG/200ML-% IV SOLN
60.0000 mg/h | INTRAVENOUS | Status: DC
  Administered 2022-08-01 (×2): 60 mg/h via INTRAVENOUS
  Filled 2022-08-01: qty 200

## 2022-08-01 MED ORDER — AMIODARONE HCL IN DEXTROSE 360-4.14 MG/200ML-% IV SOLN
INTRAVENOUS | Status: AC
  Filled 2022-08-01: qty 200

## 2022-08-01 MED ORDER — CHLORHEXIDINE GLUCONATE CLOTH 2 % EX PADS: 6.0000 | MEDICATED_PAD | Freq: Every day | CUTANEOUS | Status: DC

## 2022-08-01 NOTE — Significant Event (Signed)
Rapid Response Event Note   Reason for Call :  Rapid response called for tachycardia with HR up to 200's in patient that bedside RN had previously reported an elevated MEWS score. On call provider Anthoney Harada, NP came with rapid response nurse to bedside.  Initial Focused Assessment:  Patient does not speak English, but daughter at bedside translating and he is alert/oriented x 4 although quiet lethargic. Patient denies shortness of breath, pain, nausea, or other adverse symptoms. Patient is notably diaphoretic. CBG 110. EKG showed atrial fibrillation with rapid ventricular response. Patient pointed out areas near cervical & clavicular region that are tender, swollen and symmetrical bilaterally.  Labs previously done with elevated MEWS report are pending.  Interventions:  Focused assessment and testing as documented above. Oxygen initiated at 2L/min via [place]. Amiodarone infusion initiated with bolus and set rate as documented in MAR. Second IV site placed by another RN and IVF bolus of LR initiated. Transferred patient to stepdown level of care room 1222. Bedside report given to stepdown RN, Delrae Alfred, RN.   Plan of Care:  Patient transferred to higher level of care: Stepdown. Daughter remains with patient to assist in translating and provide comfort to patient.   Event Summary:   MD Notified: at same time as call to Clinical research associate. Call Time: MEWS notification: [id number] 07/31/22, RR call at 0001 08/01/22 Arrival Time: 0003 End Time: 0120  Lamona Curl, RN

## 2022-08-01 NOTE — Progress Notes (Signed)
Per unit RN at bedside no additional PIV needed at this time

## 2022-08-01 NOTE — Progress Notes (Signed)
Pharmacy Antibiotic Note  Tyler [name] is a 72 y.o. male admitted on 07/29/2022 with sepsis.  Pharmacy has been consulted for Cefepime dosing.  Plan: Continue Cefepime 2g IV q8h Follow up renal function, culture results, and clinical course.   Height: 5\' 7"  (170.2 cm) Weight: 88.9 kg (195 lb 15.8 oz) IBW/kg (Calculated) : 66.1  Temp (24hrs), Avg:99.7 F (37.6 C), Min:98 F (36.7 C), Max:102.9 F (39.4 C)  Recent Labs  Lab 07/29/22 1520 07/29/22 1720 07/29/22 2010 07/30/22 0410 07/31/22 0405 07/31/22 2254 08/01/22 0251 08/01/22 0620  WBC 13.8*  --   --  16.0* 12.2*  --  10.2  --   CREATININE 1.01  --   --  0.77 0.70  --  0.84  --   LATICACIDVEN 5.1* 5.0* 3.7*  --   --  2.6*  --  2.9*    Estimated Creatinine Clearance: 87 mL/min (by C-G formula based on SCr of 0.84 mg/dL).    Allergies  Allergen Reactions   Amitriptyline Other (See Comments)    Nightmares, Night sweats, Migraines    Amlodipine Swelling    BILATERAL FEET TO ANKLES   Neurontin [Gabapentin] Other (See Comments)    Nightmares; Night sweats; Headache    Antimicrobials this admission: Vanco 6/3>> 6/6 Cefepime 6/3>>  Dose adjustments this admission:   Microbiology results: 6/3: BC x 2: ngtd 6/3: COVID/Flu/RSV: neg 6/5 BCx: ngtd  Thank you for allowing pharmacy to be a part of this patient's care.  Lynann Beaver PharmD, BCPS [facility] main pharmacy [phone] 08/01/2022 11:00 AM

## 2022-08-01 NOTE — Procedures (Signed)
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 intake, tachycardia and episode of shaking and unresponsiveness then confused thereafter. EEG to evaluate for seizure  Level of alertness: Awake  AEDs during EEG study: None  Technical aspects: This EEG study was done with scalp electrodes positioned according to the 10-20 International system of electrode placement. Electrical activity was reviewed with band pass filter of 1-70Hz , sensitivity of 7 uV/mm, display speed of 82mm/sec with a 60Hz  notched filter applied as appropriate. EEG data were recorded continuously and digitally stored.  Video monitoring was available and reviewed as appropriate.  Description: The posterior dominant rhythm consists of 7.5 Hz activity of moderate voltage (25-35 uV) seen predominantly in posterior head regions, symmetric and reactive to eye opening and eye closing. EEG showed intermittent generalized sharply contoured high amplitude 2-3hz  delta slowing. Hyperventilation and photic stimulation were not performed.     ABNORMALITY - Intermittent slow, generalized  IMPRESSION: This study is suggestive of mild diffuse encephalopathy, nonspecific etiology. No seizures or epileptiform discharges were seen throughout the recording.   If suspicion for interictal activity remains a concern, a prolonged study can be considered.    Arlis Yale [name]

## 2022-08-01 NOTE — Progress Notes (Addendum)
    Patient Name: Tyler [name]           DOB: [birth year]  MRN: [id number]      Admission Date: 07/29/2022  Attending Provider: Meredeth Ide, MD  Primary Diagnosis: Sepsis [facility])   Level of care: Stepdown    CROSS COVER NOTE   Date of Service   08/01/2022   Tyler [name], 72 y.o. male, was admitted on 07/29/2022 for Sepsis [facility]).    HPI/Events of Note   Notified by bedside RN of pt sustaining HR 160-180s, Afib RVR on telemetry.   SBP 110's. Mentation at baseline.  Asymptomatic and denies chest pain, palpitations, lightheadedness, SOB, abdominal discomfort.  Endorses previous leg pain.   Atrial fibrillation with RVR, HR 160-180's at rest Hx of paroxysmal A-fib, not on any home meds for rate control or anticoagulation. Amiodarone gtt was started 6/5 for afib RVR, amio gtt currently at 30 mg/hr.  0115- Potassium 4.0, Magnesium level 1.7   Interventions/ Plan   Trial 2.5 mg IV metoprolol for HR >120 Fluid bolus, 500 cc Consider amiodarone bolus if no improvement after metoprolol 2g IV Mag sulfate        Anthoney Harada, DNP, ACNPC- AG [facility] [place]

## 2022-08-01 NOTE — Progress Notes (Addendum)
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 intake, tachycardia and episode of shaking and unresponsiveness then confused thereafter.  He was hypotensive, tachycardic and had lactic acidosis.  Temperature 100.7.  Blood cultures were obtained and patient started on IV antibiotics for sepsis of unclear origin.    Assessment/Plan:   Lactic acidosis/sepsis -Unclear source, started on vancomycin and cefepime -Blood culture showed no growth till date -Received IV fluids, blood pressure is stable -Will discontinue vancomycin, continue cefepime  Paroxysmal atrial fibrillation - transferred to SDU last night -started on amiodarone gtt -Not a candidate for anticoagulation; he has a history of PE and was on Eliquis which was stopped in 2023 after he developed hematuria.  Also has history of thrombocytopenia with normocytic anemia -He is now back to normal sinus rhythm. -Will consult cardiology for further recommendations regarding keeping him on amiodarone  Seizure-like activity -Reported prior to admission with short postictal. -MRI brain showed multifocal hyperintense T2 weighted signal within the white matter, most consistent with chronic small vessel ischemia -Will obtain EEG  Right facial paresthesia -CT maxillofacial showed large periapical lucency at the root of the most posterior remaining right maxillary tooth.  No abscess -Right anterior ethmoid and maxillary sinus complete opacification with polypoid continues, possibly antrochoanal polyp -Multiple enlarged submandibular lymph nodes measuring up to 1.6 cm, likely reactive  Acute kidney injury -Secondary to dehydration, resolved  Chronic hyponatremia -Sodium has been low for past 1 month -Sodium is low at 124 -Check serum and  urine osmolality  Constipation -Had BM yesterday with suppository -Abdomen x-ray obtained this morning showed nonobstructive bowel gas pattern  Normocytic anemia -Likely in setting of lymphoma -S/p 2 units PRBC with hemoglobin up to 8.1 g/dL -Hemoglobin dropped to 6.8 yesterday, s/p 1 unit PRBC -Hemoglobin is up to 8.0 today  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 for chemotherapy initiation after port insertion -Due to disease burden he was placed on prednisone 60 mg daily for 10 days, last dose was on the day of admission  Thrombocytopenia -Platelet count is stable  Insomnia, anxiety, nightmares -Continue Seroquel nightly  History of prostate cancer -S/p prostatectomy  L4 compression fracture -Subacute appearing -PT/OT     Medications     Chlorhexidine Gluconate Cloth  6 each Topical Daily   enoxaparin (LOVENOX) injection  40 mg Subcutaneous Q24H   feeding supplement (KATE FARMS STANDARD 1.4)  325 mL Oral BID BM   multivitamin with minerals  1 tablet Oral Daily   QUEtiapine  25 mg Oral QHS     Data Reviewed:   CBG:  Recent Labs  Lab 07/29/22 1516 08/01/22 0010  GLUCAP 93 110*    SpO2: 100 % O2 Flow Rate (L/min): 2 L/min    Vitals:   08/01/22 0400 08/01/22 0404 08/01/22 0500 08/01/22 0600  BP: 112/61  122/63 (!) 112/91  Pulse: 94  97 100  Resp: 20  20 (!) 25  Temp:  98.1 F (36.7 C)    TempSrc:  Axillary    SpO2: 100%  100% 100%  Weight:      Height:          Data Reviewed:  Basic Metabolic Panel: Recent Labs  Lab 07/29/22 1520 07/30/22 0410 07/31/22 0405 07/31/22 2254 08/01/22 0251  NA 126* 127* 128*  --  124*  K 4.4 4.2 4.0  --  3.6  CL 92* 97* 96*  --  95*  CO2 20* 20* 21*  --  20*  GLUCOSE 97 106* 82  --  112*  BUN 28* 21 16  --  22  CREATININE 1.01 0.77 0.70  --  0.84  CALCIUM 8.1* 8.0* 7.9*  --  7.6*  MG  --   --   --  1.8  --   PHOS  --   --   --  2.8  --     CBC: Recent Labs  Lab 07/29/22 1520  07/30/22 0410 07/31/22 0405 08/01/22 0251  WBC 13.8* 16.0* 12.2* 10.2  NEUTROABS 10.3*  --   --   --   HGB 6.4* 8.1* 6.8* 8.0*  HCT 20.1* 24.5* 21.9* 24.7*  MCV 80.1 80.6 83.9 83.4  PLT 109* 110* 91* 87*    LFT Recent Labs  Lab 07/29/22 1520 07/30/22 0410 07/31/22 0405 08/01/22 0251  AST 33 39 38 45*  ALT 40 34 29 34  ALKPHOS 89 89 72 74  BILITOT 2.0* 1.8* 2.2* 2.0*  PROT 5.4* 5.1* 4.6* 4.7*  ALBUMIN 2.3* 2.3* 2.0* 2.0*     Antibiotics: Anti-infectives (From admission, onward)    Start     Dose/Rate Route Frequency Ordered Stop   07/30/22 1600  vancomycin (VANCOREADY) IVPB 1750 mg/350 mL        1,750 mg 175 mL/hr over 120 Minutes Intravenous Every 24 hours 07/29/22 2108     07/30/22 0000  ceFEPIme (MAXIPIME) 2 g in sodium chloride 0.9 % 100 mL IVPB        2 g 200 mL/hr over 30 Minutes Intravenous Every 8 hours 07/29/22 2108     07/29/22 1530  ceFEPIme (MAXIPIME) 2 g in sodium chloride 0.9 % 100 mL IVPB        2 g 200 mL/hr over 30 Minutes Intravenous  Once 07/29/22 1517 07/29/22 1628   07/29/22 1530  metroNIDAZOLE (FLAGYL) IVPB 500 mg        500 mg 100 mL/hr over 60 Minutes Intravenous  Once 07/29/22 1517 07/29/22 1701   07/29/22 1530  vancomycin (VANCOCIN) IVPB 1000 mg/200 mL premix  Status:  Discontinued        1,000 mg 200 mL/hr over 60 Minutes Intravenous  Once 07/29/22 1517 07/29/22 1520   07/29/22 1530  vancomycin (VANCOREADY) IVPB 1750 mg/350 mL        1,750 mg 175 mL/hr over 120 Minutes Intravenous  Once 07/29/22 1520 07/29/22 1908        DVT prophylaxis: Lovenox  Code Status: Full code  Family Communication:    CONSULTS    Subjective   Developed A-fib with RVR last night.  Transferred to stepdown unit.  Started on amiodarone gtt.  Now back to normal sinus rhythm.   Objective    Physical Examination:  General-appears in no acute distress Heart-S1-S2, regular, no murmur auscultated Lungs-clear to auscultation bilaterally, no wheezing  or crackles auscultated Abdomen-soft, nontender, no organomegaly Extremities-no edema in the lower extremities Neuro-alert, oriented x3, no focal deficit noted   Status is: Inpatient:             Meredeth Ide   [facility] If 7PM-7AM, please contact night-coverage at [email or web address], Office  [phone]   08/01/2022, 8:01 AM  LOS: 3 days

## 2022-08-01 NOTE — Evaluation (Addendum)
Physical Therapy Evaluation Patient Details Name: Tyler [name] MRN: [id number] DOB: 12/11/1950 Today's Date: 08/01/2022  History of Present Illness  Tyler [name] is a 72 yr old male admitted to the hospital with poor po intake, abdominal pain, and near syncope. 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  Pt agreeable to work with PT. Sat EOB with ~ min/guard and assist for lines, pt declined incr mobility stating he was too weak. Pt has been up to [facility] with nursing with minimal assist; will continue to follow. Pt may need post acute therapy at home pending progress in acute setting. VSS stable during PT session, HR max 126, 90s at rest; on RA  Pt currently with functional limitations due to the deficits listed below (see PT Problem List). Pt will benefit from acute skilled PT to increase their independence and safety with mobility to allow discharge.          Recommendations for follow up therapy are one component of a multi-disciplinary discharge planning process, led by the attending physician.  Recommendations may be updated based on patient status, additional functional criteria and insurance authorization.  Follow Up Recommendations       Assistance Recommended at Discharge Intermittent Supervision/Assistance  Patient can return home with the following  Assistance with cooking/housework;Assist for transportation;Help with stairs or ramp for entrance    Equipment Recommendations None recommended by PT  Recommendations for Other Services       Functional Status Assessment Patient has had a recent decline in their functional status and demonstrates the ability to make significant improvements in function in a reasonable and predictable amount of time.     Precautions / Restrictions Precautions Precautions:  Fall Restrictions Weight Bearing Restrictions: No      Mobility  Bed Mobility Overal bed mobility: Needs Assistance Bed Mobility: Supine to Sit, Sit to Supine     Supine to sit: Min guard Sit to supine: Min guard   General bed mobility comments: assist for line management; min/guard for safety    Transfers                   General transfer comment: unable to assess, as the pt declined to attempt, due to reports of BLE weakness and  pain; has been able to transfer to [facility] per nursing    Ambulation/Gait               General Gait Details: pt declined attempt  Stairs            Wheelchair Mobility    Modified Rankin (Stroke Patients Only)       Balance       Sitting balance - Comments: static sitting fair+, limtied wt shifting d/t pain       Standing balance comment: unable to assess, pt declined                             Pertinent Vitals/Pain Pain Assessment Pain Assessment: Faces Faces Pain Scale: Hurts little more Pain Location: back/abd Pain Descriptors / Indicators: Grimacing, Sore Pain Intervention(s): Limited activity within patient's tolerance, Monitored during session, Repositioned    Home Living Family/patient expects to be discharged to:: Private residence Living Arrangements: Spouse/significant other Available Help at Discharge: Family Type of Home: Apartment (second floor apt) Home Access:  Stairs to enter Entrance Stairs-Rails: Doctor, general practice of Steps: flight   Home Layout: One level Home Equipment: Agricultural consultant (2 wheels);BSC/3in1      Prior Function Prior Level of Function : Working/employed             Mobility Comments: pt reports to PT beign independent, working; per [employer] amb with walker at times ADLs Comments: He reported requiring occasional assist from his spouse for self-care tasks.     Hand Dominance   Dominant Hand: Left    Extremity/Trunk Assessment    Upper Extremity Assessment Upper Extremity Assessment: Overall WFL for tasks assessed    Lower Extremity Assessment Lower Extremity Assessment: Overall WFL for tasks assessed (pt reports LEs weak)       Communication   Communication: Prefers language other than [place] (Swahili, speaks some [place])  Cognition Arousal/Alertness: Awake/alert Behavior During Therapy: WFL for tasks assessed/performed Overall Cognitive Status: Difficult to assess                                 General Comments: speaks limited English, able to follow commands        General Comments      Exercises     Assessment/Plan    PT Assessment Patient needs continued PT services  PT Problem List Decreased strength;Decreased activity tolerance;Decreased balance;Decreased mobility;Pain;Decreased knowledge of use of DME       PT Treatment Interventions DME instruction;Gait training;Functional mobility training;Therapeutic activities;Therapeutic exercise;Patient/family education    PT Goals (Current goals can be found in the Care Plan section)  Acute Rehab PT Goals PT Goal Formulation: With patient Time For Goal Achievement: 08/15/22 Potential to Achieve Goals: Good    Frequency Min 1X/week     Co-evaluation               AM-PAC PT "6 Clicks" Mobility  Outcome Measure Help needed turning from your back to your side while in a flat bed without using bedrails?: A Little Help needed moving from lying on your back to sitting on the side of a flat bed without using bedrails?: A Little Help needed moving to and from a bed to a chair (including a wheelchair)?: A Lot Help needed standing up from a chair using your arms (e.g., wheelchair or bedside chair)?: A Lot Help needed to walk in hospital room?: A Lot Help needed climbing 3-5 steps with a railing? : Total 6 Click Score: 13    End of Session   Activity Tolerance: Patient tolerated treatment well;Other (comment);Patient limited  by pain (self limiting) Patient left: with call bell/phone within reach;in bed;with bed alarm set Nurse Communication: Mobility status PT Visit Diagnosis: Other abnormalities of gait and mobility (R26.89);Difficulty in walking, not elsewhere classified (R26.2)    Time: 1610-9604 PT Time Calculation (min) (ACUTE ONLY): 17 min   Charges:   PT Evaluation $PT Eval Low Complexity: 1 Low          Saleena Tamas, PT  Acute Rehab Dept ([facility]) [phone]  08/01/2022   [facility] 08/01/2022, 1:51 PM

## 2022-08-01 NOTE — Progress Notes (Addendum)
    Patient Name: Tyler [name]           DOB: 1950-10-03  MRN: [id number]      Admission Date: 07/29/2022  Attending Provider: Meredeth Ide, MD  Primary Diagnosis: Sepsis [facility])   Level of care: Stepdown    CROSS COVER NOTE   Date of Service   08/01/2022   Tricia Guilbeau, 72 y.o. male, was admitted on 07/29/2022 for Sepsis [facility]), unclear source, currently on IV vancomycin and cefepime.  Patient arrived tachycardic, hypotensive, febrile, with elevated lactic acidosis.  No current growth to blood cultures.    HPI/Events of Note   2230- RN reports patient is febrile, 102.9, tachypneic with wheezing, and increase/frequent runs of V. Tach.  Patient expresses no complaints, all other vital stable.  Plan: Repeat chest x-ray, UA, blood cultures Labs-lactic acidosis, magnesium, phosphate, CBC, BMP EKG Tylenol    0015- HR sustaining 190-230s, asymptomatic but appears diaphoretic. Mentation at baseline.  Denies chest pain, palpitations, lightheadedness, abdominal discomfort.  Atrial fibrillation with RVR Hx of paroxysmal A-fib, not on rate control or anticoagulation meds EKG interpreted as A-fib RVR, HR sustaining> 190.  SBP 90- 110s.  History of reaction to CCB per family.   Patient will be started on amiodarone bolus and continuous infusion.  Given soft BP and elevated lactic acidosis (2.6), patient will be given 500 cc LR bolus  Per documentation, patient was previously on Eliquis for history of PE.  His urologist stopped Eliquis in 2023 after developing hematuria.  Patient also has a history of thrombocytopenia and normocytic anemia.  His daughter Karena Addison is at bedside and was updated regarding event. She is a NP and has a clear understanding of current event. Pt will bet transferred to SDU for closer monitoring.      Interventions/ Plan   Chest x-ray --> mild bibasilar atelectasis, continue incentive spirometer use Labs-CBC, BMP, magnesium, phosphate, lactic acidosis, troponin  (negative) EKG-A-fib RVR Amiodarone bolus and gtt Transferred to SDU        Anthoney Harada, DNP, ACNPC- AG [facility] [place]

## 2022-08-01 NOTE — Progress Notes (Signed)
EEG complete - results pending 

## 2022-08-02 ENCOUNTER — Ambulatory Visit (HOSPITAL_COMMUNITY): Payer: 59

## 2022-08-02 ENCOUNTER — Other Ambulatory Visit: Payer: Self-pay | Admitting: Radiology

## 2022-08-02 ENCOUNTER — Inpatient Hospital Stay (HOSPITAL_COMMUNITY): Payer: 59

## 2022-08-02 DIAGNOSIS — I4819 Other persistent atrial fibrillation: Secondary | ICD-10-CM

## 2022-08-02 DIAGNOSIS — D696 Thrombocytopenia, unspecified: Secondary | ICD-10-CM

## 2022-08-02 DIAGNOSIS — A419 Sepsis, unspecified organism: Secondary | ICD-10-CM | POA: Diagnosis not present

## 2022-08-02 DIAGNOSIS — I10 Essential (primary) hypertension: Secondary | ICD-10-CM | POA: Diagnosis not present

## 2022-08-02 DIAGNOSIS — I480 Paroxysmal atrial fibrillation: Secondary | ICD-10-CM

## 2022-08-02 DIAGNOSIS — D649 Anemia, unspecified: Secondary | ICD-10-CM | POA: Diagnosis not present

## 2022-08-02 DIAGNOSIS — C8338 Diffuse large B-cell lymphoma, lymph nodes of multiple sites: Secondary | ICD-10-CM | POA: Diagnosis not present

## 2022-08-02 LAB — BASIC METABOLIC PANEL
Anion gap: 9 (ref 5–15)
BUN: 24 mg/dL — ABNORMAL HIGH (ref 8–23)
CO2: 19 mmol/L — ABNORMAL LOW (ref 22–32)
Calcium: 7.5 mg/dL — ABNORMAL LOW (ref 8.9–10.3)
Chloride: 97 mmol/L — ABNORMAL LOW (ref 98–111)
Creatinine, Ser: 0.82 mg/dL (ref 0.61–1.24)
GFR, Estimated: >60 mL/min (ref >60)
Glucose, Bld: 117 mg/dL — ABNORMAL HIGH (ref 70–99)
Potassium: 4 mmol/L (ref 3.5–5.1)
Sodium: 125 mmol/L — ABNORMAL LOW (ref 135–145)

## 2022-08-02 LAB — CBC
HCT: 23.4 % — ABNORMAL LOW (ref 39.0–52.0)
Hemoglobin: 7.4 g/dL — ABNORMAL LOW (ref 13.0–17.0)
MCH: 26.8 pg (ref 26.0–34.0)
MCHC: 31.6 g/dL (ref 30.0–36.0)
MCV: 84.8 fL (ref 80.0–100.0)
Platelets: 92 10*3/uL — ABNORMAL LOW (ref 150–400)
RBC: 2.76 MIL/uL — ABNORMAL LOW (ref 4.22–5.81)
RDW: 20.4 % — ABNORMAL HIGH (ref 11.5–15.5)
WBC: 10.1 10*3/uL (ref 4.0–10.5)
nRBC: 0.4 % — ABNORMAL HIGH (ref 0.0–0.2)

## 2022-08-02 LAB — OSMOLALITY: Osmolality: 271 mOsm/kg — ABNORMAL LOW (ref 275–295)

## 2022-08-02 LAB — SODIUM, URINE, RANDOM: Sodium, Ur: 45 mmol/L

## 2022-08-02 LAB — MAGNESIUM: Magnesium: 1.7 mg/dL (ref 1.7–2.4)

## 2022-08-02 LAB — CULTURE, BLOOD (ROUTINE X 2)

## 2022-08-02 LAB — PHOSPHORUS: Phosphorus: 2.8 mg/dL (ref 2.5–4.6)

## 2022-08-02 LAB — BRAIN NATRIURETIC PEPTIDE: B Natriuretic Peptide: 55.5 pg/mL (ref 0.0–100.0)

## 2022-08-02 MED ORDER — DOCUSATE SODIUM 100 MG PO CAPS
100.0000 mg | ORAL_CAPSULE | Freq: Every day | ORAL | Status: DC | PRN
  Administered 2022-08-02: 100 mg via ORAL
  Filled 2022-08-02: qty 1

## 2022-08-02 MED ORDER — METOPROLOL TARTRATE 25 MG PO TABS: 25.0000 mg | ORAL_TABLET | Freq: Two times a day (BID) | ORAL | Status: DC

## 2022-08-02 MED ORDER — MAGNESIUM SULFATE 2 GM/50ML IV SOLN
2.0000 g | Freq: Once | INTRAVENOUS | Status: AC
  Administered 2022-08-02: 2 g via INTRAVENOUS
  Filled 2022-08-02: qty 50

## 2022-08-02 MED ORDER — METOPROLOL TARTRATE 5 MG/5ML IV SOLN
INTRAVENOUS | Status: AC
  Filled 2022-08-02: qty 5

## 2022-08-02 MED ORDER — FUROSEMIDE 10 MG/ML IJ SOLN
20.0000 mg | Freq: Once | INTRAMUSCULAR | Status: AC
  Administered 2022-08-02: 20 mg via INTRAVENOUS
  Filled 2022-08-02: qty 2

## 2022-08-02 MED ORDER — POLYETHYLENE GLYCOL 3350 17 G PO PACK
17.0000 g | PACK | Freq: Every day | ORAL | Status: DC
  Administered 2022-08-02 – 2022-08-04 (×3): 17 g via ORAL
  Filled 2022-08-02 (×3): qty 1

## 2022-08-02 MED ORDER — METOPROLOL TARTRATE 5 MG/5ML IV SOLN: 2.5000 mg | Freq: Once | INTRAVENOUS | Status: DC

## 2022-08-02 MED ORDER — METOPROLOL TARTRATE 25 MG PO TABS
25.0000 mg | ORAL_TABLET | Freq: Two times a day (BID) | ORAL | Status: DC
  Administered 2022-08-02 – 2022-08-05 (×6): 25 mg via ORAL
  Filled 2022-08-02 (×6): qty 1

## 2022-08-02 MED ORDER — METOPROLOL TARTRATE 5 MG/5ML IV SOLN
2.5000 mg | Freq: Four times a day (QID) | INTRAVENOUS | Status: DC | PRN
  Administered 2022-08-02: 2.5 mg via INTRAVENOUS

## 2022-08-02 MED ORDER — ORAL CARE MOUTH RINSE: 15.0000 mL | OROMUCOSAL | Status: DC | PRN

## 2022-08-02 MED ORDER — SENNA 8.6 MG PO TABS
1.0000 | ORAL_TABLET | Freq: Every day | ORAL | Status: DC
  Administered 2022-08-02 – 2022-08-04 (×3): 8.6 mg via ORAL
  Filled 2022-08-02 (×3): qty 1

## 2022-08-02 NOTE — Progress Notes (Signed)
Occupational Therapy Treatment Patient Details Name: Tyler [name] MRN: [id number] DOB: Feb 28, 1950 Today's Date: 08/02/2022   History of present illness Tyler [name] is a 72 yr old male admitted to the hospital with poor po intake, abdominal pain, and near syncope. 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  Patient able to stand this date with [facility], able to side step to the R Min A,  but unable to side step to the L due to back pain.  Same with transfers, able to stand pivot R, but not left.  Patient placed brief on for standing due to fear of incontinence, Mod A to place over feet, but Min Guard to stand and hike.  Back pain seems to be more of a limiting factor then poor activity tolerance.  MD sent secure chat to inquire if [facility] via [facility] could be considered.  OT will continue efforts in the acute setting to address deficits, and home with [facility] appears to be the plan.  SNF may need to be considered if he does not progress.     Recommendations for follow up therapy are one component of a multi-disciplinary discharge planning process, led by the attending physician.  Recommendations may be updated based on patient status, additional functional criteria and insurance authorization.    Assistance Recommended at Discharge Intermittent Supervision/Assistance  Patient can return home with the following  Assistance with cooking/housework;Help with stairs or ramp for entrance;A lot of help with bathing/dressing/bathroom;A little help with bathing/dressing/bathroom;A lot of help with walking and/or transfers   Equipment Recommendations  None recommended by OT    Recommendations for Other Services      Precautions / Restrictions Precautions Precautions: Fall Precaution Comments: back pain,L4  compression fracture Restrictions Weight Bearing Restrictions: No       Mobility Bed Mobility   Bed Mobility: Supine to Sit, Sit to Supine     Supine to sit: Min assist Sit to supine: Min assist   General bed mobility comments: attempts at logroll, but patient moves fast in anticipation of the pain    Transfers                         Balance Overall balance assessment: Needs assistance Sitting-balance support: Feet supported Sitting balance-Leahy Scale: Fair     Standing balance support: No upper extremity supported Standing balance-Leahy Scale: Fair Standing balance comment: static only                           ADL either performed or assessed with clinical judgement   ADL       Grooming: Set up;Sitting   Upper Body Bathing: Minimal assistance;Sitting   Lower Body Bathing: Sitting/lateral leans;Sit to/from stand;Moderate assistance   Upper Body Dressing : Set up;Sitting   Lower Body Dressing: Moderate assistance;Sitting/lateral leans;Sit to/from stand Lower Body Dressing Details (indicate cue type and reason): able to static stand for pant mangement Toilet Transfer: Maximal assistance;Rolling walker (2 wheels) Toilet Transfer Details (indicate cue type and reason): transfer to right, unable to off load L foot to step L, but he could step R                Extremity/Trunk Assessment Upper Extremity Assessment Upper Extremity Assessment: Overall [facility] for  tasks assessed   Lower Extremity Assessment Lower Extremity Assessment: Defer to PT evaluation   Cervical / Trunk Assessment Cervical / Trunk Assessment: Other exceptions Cervical / Trunk Exceptions: L4 compression fracture                      Cognition Arousal/Alertness: Awake/alert Behavior During Therapy: WFL for tasks assessed/performed Overall Cognitive Status: Within Functional Limits for tasks assessed                                                              Pertinent Vitals/ Pain       Pain Assessment Pain Assessment: Faces Faces Pain Scale: Hurts even more Pain Location: back/abd Pain Descriptors / Indicators: Grimacing, Guarding, Sharp Pain Intervention(s): Monitored during session, Patient requesting pain meds-RN notified                                                          Frequency  Min 1X/week        Progress Toward Goals  OT Goals(current goals can now be found in the care plan section)  Progress towards OT goals: Progressing toward goals  Acute Rehab OT Goals OT Goal Formulation: With patient Time For Goal Achievement: 08/14/22 Potential to Achieve Goals: Good  Plan Discharge plan remains appropriate    Co-evaluation                 AM-PAC OT "6 Clicks" Daily Activity     Outcome Measure   Help from another person eating meals?: None Help from another person taking care of personal grooming?: A Little Help from another person toileting, which includes using toliet, bedpan, or urinal?: A Lot Help from another person bathing (including washing, rinsing, drying)?: A Lot Help from another person to put on and taking off regular upper body clothing?: None Help from another person to put on and taking off regular lower body clothing?: A Lot 6 Click Score: 17    End of Session    OT Visit Diagnosis: Pain;Muscle weakness (generalized) (M62.81)   Activity Tolerance Patient limited by pain   Patient Left in bed;with call bell/phone within reach;with family/visitor present   Nurse Communication Patient requests pain meds        Time: 1540-1600 OT Time Calculation (min): 20 min  Charges: OT General Charges $OT Visit: 1 Visit OT Treatments $Self Care/Home Management : 8-22 mins  08/02/2022  RP, OTR/L  Acute Rehabilitation Services  Office:  [phone]   Suzanna Obey 08/02/2022, 4:10 PM

## 2022-08-02 NOTE — Progress Notes (Addendum)
[facility]  PROGRESS NOTE  Tyler [name] ZOX:[id number] DOB: [birth year] DOA: 07/29/2022 PCP: Nechama Guard, FNP   Brief HPI:   72 year old male with history of MDS that converted to follicle lymphoma came to ED on 07/28/2022 with poor p.o. intake, tachycardia and episode of shaking and unresponsiveness then confused thereafter.  He was hypotensive, tachycardic and had lactic acidosis.  Temperature 100.7.  Blood cultures were obtained and patient started on IV antibiotics for sepsis of unclear origin.    Assessment/Plan:   Lactic acidosis/sepsis -Unclear source, started on vancomycin and cefepime -Blood culture showed no growth till date -Received IV fluids, blood pressure is stable - vancomycin was discontinued, continue cefepime  Paroxysmal atrial fibrillation - transferred to SDU last night -started on amiodarone gtt -Not a candidate for anticoagulation; he has a history of PE and was on Eliquis which was stopped in 2023 after he developed hematuria.  Also has history of thrombocytopenia with normocytic anemia -He is now back to normal sinus rhythm. -Cardiology consulted  Dyspnea -Patient is +8 L volume overload -Check BNP, chest x-ray -Will give 1 dose of Lasix 20 mg IV  Seizure-like activity -Reported prior to admission with short postictal. -MRI brain showed multifocal hyperintense T2 weighted signal within the white matter, most consistent with chronic small vessel ischemia -EEG obtained shows mild diffuse encephalopathy, no seizures or epileptiform discharges  Right facial paresthesia -CT maxillofacial showed large periapical lucency at the root of the most posterior remaining right maxillary tooth.  No abscess -Right anterior ethmoid and maxillary sinus complete opacification with polypoid continues, possibly antrochoanal polyp -Multiple enlarged submandibular lymph nodes measuring up to 1.6 cm, likely reactive  Acute kidney injury -Secondary to dehydration,  resolved  Chronic hyponatremia -Sodium has been low for past 1 month -Sodium is low at 125 -Serum osmolality is 271, urine osmolality 702  Constipation -Had BM yesterday with suppository -Abdomen x-ray obtained this morning showed nonobstructive bowel gas pattern  Normocytic anemia -Likely in setting of lymphoma -S/p 3 units PRBC with hemoglobin up to 7.4 -Check stool for FOBT   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 for chemotherapy initiation after port insertion -Due to disease burden he was placed on prednisone 60 mg daily for 10 days, last dose was on the day of admission -Oncology to see patient today  Thrombocytopenia -Platelet count is stable  Insomnia, anxiety, nightmares -Continue Seroquel nightly  History of prostate cancer -S/p prostatectomy  L4 compression fracture -Subacute appearing -PT/OT     Medications     Chlorhexidine Gluconate Cloth  6 each Topical Daily   enoxaparin (LOVENOX) injection  40 mg Subcutaneous Q24H   feeding supplement (KATE FARMS STANDARD 1.4)  325 mL Oral BID BM   multivitamin with minerals  1 tablet Oral Daily   polyethylene glycol  17 g Oral Daily   QUEtiapine  25 mg Oral QHS   senna  1 tablet Oral Daily     Data Reviewed:   CBG:  Recent Labs  Lab 07/29/22 1516 08/01/22 0010  GLUCAP 93 110*    SpO2: 95 % O2 Flow Rate (L/min): 2 L/min    Vitals:   08/02/22 0330 08/02/22 0345 08/02/22 0400 08/02/22 0600  BP: 119/79 (!) 134/56 116/68 (!) 122/56  Pulse: (!) 53 92 95 97  Resp: (!) 21 (!) 24 19 (!) 23  Temp:   100.1 F (37.8 C)   TempSrc:   Axillary   SpO2: 100% 99% 100% 95%  Weight:  Height:          Data Reviewed:  Basic Metabolic Panel: Recent Labs  Lab 07/29/22 1520 07/30/22 0410 07/31/22 0405 07/31/22 2254 08/01/22 0251 08/02/22 0004  NA 126* 127* 128*  --  124* 125*  K 4.4 4.2 4.0  --  3.6 4.0  CL 92* 97* 96*  --  95* 97*  CO2 20* 20* 21*  --  20* 19*  GLUCOSE 97 106* 82   --  112* 117*  BUN 28* 21 16  --  22 24*  CREATININE 1.01 0.77 0.70  --  0.84 0.82  CALCIUM 8.1* 8.0* 7.9*  --  7.6* 7.5*  MG  --   --   --  1.8  --  1.7  PHOS  --   --   --  2.8  --  2.8    CBC: Recent Labs  Lab 07/29/22 1520 07/30/22 0410 07/31/22 0405 08/01/22 0251 08/02/22 0004  WBC 13.8* 16.0* 12.2* 10.2 10.1  NEUTROABS 10.3*  --   --   --   --   HGB 6.4* 8.1* 6.8* 8.0* 7.4*  HCT 20.1* 24.5* 21.9* 24.7* 23.4*  MCV 80.1 80.6 83.9 83.4 84.8  PLT 109* 110* 91* 87* 92*    LFT Recent Labs  Lab 07/29/22 1520 07/30/22 0410 07/31/22 0405 08/01/22 0251  AST 33 39 38 45*  ALT 40 34 29 34  ALKPHOS 89 89 72 74  BILITOT 2.0* 1.8* 2.2* 2.0*  PROT 5.4* 5.1* 4.6* 4.7*  ALBUMIN 2.3* 2.3* 2.0* 2.0*     Antibiotics: Anti-infectives (From admission, onward)    Start     Dose/Rate Route Frequency Ordered Stop   07/30/22 1600  vancomycin (VANCOREADY) IVPB 1750 mg/350 mL  Status:  Discontinued        1,750 mg 175 mL/hr over 120 Minutes Intravenous Every 24 hours 07/29/22 2108 08/01/22 1135   07/30/22 0000  ceFEPIme (MAXIPIME) 2 g in sodium chloride 0.9 % 100 mL IVPB        2 g 200 mL/hr over 30 Minutes Intravenous Every 8 hours 07/29/22 2108     07/29/22 1530  ceFEPIme (MAXIPIME) 2 g in sodium chloride 0.9 % 100 mL IVPB        2 g 200 mL/hr over 30 Minutes Intravenous  Once 07/29/22 1517 07/29/22 1628   07/29/22 1530  metroNIDAZOLE (FLAGYL) IVPB 500 mg        500 mg 100 mL/hr over 60 Minutes Intravenous  Once 07/29/22 1517 07/29/22 1701   07/29/22 1530  vancomycin (VANCOCIN) IVPB 1000 mg/200 mL premix  Status:  Discontinued        1,000 mg 200 mL/hr over 60 Minutes Intravenous  Once 07/29/22 1517 07/29/22 1520   07/29/22 1530  vancomycin (VANCOREADY) IVPB 1750 mg/350 mL        1,750 mg 175 mL/hr over 120 Minutes Intravenous  Once 07/29/22 1520 07/29/22 1908        DVT prophylaxis: Lovenox  Code Status: Full code  Family Communication: Discussed with patient's  daughter on phone   CONSULTS    Subjective   Patient seen, seems to have labored breathing this morning.  He got fluid bolus 500 cc last night.   Objective    Physical Examination:  Appears in no acute distress Heart S1-S2, regular Lungs decreased breath sounds at lung bases Abdomen is soft, nontender, no organomegaly   Status is: Inpatient:  Meredeth Ide   [facility] If 7PM-7AM, please contact night-coverage at [email or web address], Office  [phone]   08/02/2022, 7:57 AM  LOS: 4 days

## 2022-08-02 NOTE — Consult Note (Addendum)
Cardiology Consultation   Patient ID: Tyler [name] MRN: [id number]; DOB: [birth year]  Admit date: 07/29/2022 Date of Consult: 08/02/2022  PCP:  Nechama Guard, FNP   [facility] Providers Cardiologist:  None   {   Patient Profile:   Tyler [name] is a 72 y.o. male with a history of paroxysmal atrial fibrillation not on anticoagulation, hypertension, PE following surgery in 2021 completed course of anticoagulation, recurrent UTIs, anemia, myelodysplastic syndrome, diffuse large B-cell lymphoma, and prior prostate s/p prostatectomy in 2021 cancer who is being seen 08/02/2022 for the evaluation of atrial fibrillation with RVR at the request of Dr. Sharl Ma.  History of Present Illness:   [name] is a 72 year old male with the above history. Patient was diagnosed with myelodysplastic syndrome in the fall of 2023 and more recently was diagnosed with diffuse large B-cell lymphoma. He follows with Oncology. He was last seen by Oncology at the end of May and plan was to have Port-A-Cath placed and have Echo done and then start chemotherapy. He had Echo done on 07/29/2022 which showed LVEF of >75% with no wall motion abnormalities and no significant valvular disease. He also has problems with anemia and has required multiple blood transfusions.  Patient presented to the [facility] ED on 07/29/2022 for recurrent abdominal pain, fevers, and near syncope. Upon arrival to the ED, patient was febrile, hypotensive with BP of 85/73, and tachycardic meeting criteria for sepsis. Lactic acid was >5. EKG showed sinus tachycardia, rate 115 bpm, with no acute ST/T changes. Initial lab work also showed WBC of 13.8, Hgb of 6.4, Plts of 109, Na 126, Cl 92, CO2 20, Cr 1.01, Albumin of 2.3, and Total Bili of 2.0. Blood cultures negative to date. He was started on IV fluids and antibiotics and also transfused with PRBCs. So far no clear source for his sepsis. Chest x-ray showed widening of superior mediastinum  consistent with adenopathy and concerning for lymphoma or metastatic disease but no signs of infection. Abdominal/ pelvic CT also showed multiple lymph nodes but no acute abdominal/ pelvic process. Patient was noted to have worsening tachycardia on evening of 6/5 to 6/6 and EKG showed atrial fibrillation with rate of 207 bpm and some diffuse ST depression (likely rate related). He was febrile at the time with temp of 102.9. Systolic BP dropped to the high 80s with this. He was given additional IV fluids and started on IV Amiodarone. Cardiology was consulted.   Patient speaks Swahili; Kiswahili and very little [place]. Offered to use a Adult nurse but patient and wife preferred to use daughter Tyler [name]) who is a NP. Called and spoke with daughter. She states he previously diagnosed with atrial fibrillation in the setting of a PE following an extensive proctectomy in 2021. He was on Eliquis for about 6 months after this and then it was stopped due to hematuria. He has not had any documented recurrence of atrial fibrillation since then until this admission. Daughter states patient has been having a lot of abdominal and lower back pain and also reports decreased PO intake an nausea and vomiting over the last 3 weeks but he has not been complaining of any cardiac symptoms. Patient presented for Echo on 07/29/2022 and Echo tech had concerns about his tachycardia and signs of hypovolemia during the study. When patient got home, daughter was planning on pushing fluids but he had a very brief syncopal episode. Daughter states he was only unconscious for a couple of seconds but  then was very lethargic after coming to. She called 911 and BP was 70/40 when they arrived. Patient denies any chest pain or shortness of breath when he went into rapid atrial fibrillation the other night here in the hospital but per nursing notes he was diaphoretic. He denied any palpitations with this either and no recurrent syncope.  No orthopnea or PND. Daughter is worried that the IV Morphine he is on for his abdominal and back pain may be masking some cardiac symptoms. Patient is currently in normal sinus rhythm with rates in the 90s but has been having paroxysmal episodes of atrial fibrillation. Daughter states there has been no obvious bleeding in urine or stools.   Past Medical History:  Diagnosis Date   Cancer [facility])    prostate   Hypertension     Past Surgical History:  Procedure Laterality Date   LYMPHADENECTOMY Bilateral 09/01/2019   Procedure: LYMPHADENECTOMY;  Surgeon: Sebastian Ache, MD;  Location: [facility] ORS;  Service: Urology;  Laterality: Bilateral;   ROBOT ASSISTED LAPAROSCOPIC RADICAL PROSTATECTOMY N/A 09/01/2019   Procedure: XI ROBOTIC ASSISTED LAPAROSCOPIC RADICAL PROSTATECTOMY;  Surgeon: Sebastian Ache, MD;  Location: [facility] OR[facility];  Service: Urology;  Laterality: N/A;  3 HRS     Home Medications:  Prior to Admission medications   Medication Sig Start Date End Date Taking? Authorizing Provider  allopurinol (ZYLOPRIM) 100 MG tablet Take 1 tablet (100 mg total) by mouth 2 (two) times daily. 07/19/22  Yes Johney Maine, MD  B Complex Vitamins (VITAMIN B COMPLEX) TABS Take 1 tablet by mouth daily. 12/21/21  Yes Amin, Loura Halt, MD  cholecalciferol (VITAMIN D3) 25 MCG (1000 UNIT) tablet Take 2 tablets (2,000 Units total) by mouth daily. 01/21/22  Yes Johney Maine, MD  diclofenac Sodium (VOLTAREN) 1 % GEL APPLY 4 G TOPICALLY 4 (FOUR) TIMES DAILY. APPLY TO RIGHT LATERAL HIP AND THIGH DOWN TO KNEE Patient taking differently: Apply 4 g topically daily as needed (For pain). 06/12/22  Yes Nestor Ramp, MD  folic acid (FOLVITE) 1 MG tablet Take 1 mg by mouth daily. 03/27/22  Yes [provider]  lidocaine-prilocaine (EMLA) cream Apply to affected area once Patient taking differently: Apply 1 Application topically daily as needed (For port). 07/26/22  Yes Johney Maine, MD  lisinopril  (ZESTRIL) 5 MG tablet Take 5 mg by mouth daily.   Yes [provider]  Menthol-Methyl Salicylate (MUSCLE RUB) 10-15 % CREA Apply 1 Application topically daily as needed for muscle pain.   Yes [provider]  metoCLOPramide (REGLAN) 10 MG tablet Take 1 tablet (10 mg total) by mouth every 6 (six) hours as needed for nausea. 07/15/22  Yes Gerhard Munch, MD  Multiple Vitamin (MULTIVITAMIN WITH MINERALS) TABS tablet Take 1 tablet by mouth daily. 12/21/21  Yes Amin, Ankit Chirag, MD  ondansetron (ZOFRAN) 8 MG tablet Take 1 tablet (8 mg total) by mouth every 8 (eight) hours as needed for nausea or vomiting. Start on the third day after cyclophosphamide chemotherapy. 07/26/22  Yes Johney Maine, MD  oxyCODONE (OXY IR/ROXICODONE) 5 MG immediate release tablet Take 1 tablet (5 mg total) by mouth every 6 (six) hours as needed for severe pain. 07/19/22  Yes Johney Maine, MD  prochlorperazine (COMPAZINE) 10 MG tablet Take 1 tablet (10 mg total) by mouth every 6 (six) hours as needed for nausea or vomiting. 07/26/22  Yes Johney Maine, MD  senna-docusate (SENNA S) 8.6-50 MG tablet Take 2 tablets by mouth at  bedtime as needed for mild constipation. 07/19/22  Yes Johney Maine, MD  acyclovir (ZOVIRAX) 400 MG tablet Take 1 tablet (400 mg total) by mouth daily. Patient not taking: Reported on 07/29/2022 07/26/22   Johney Maine, MD  allopurinol (ZYLOPRIM) 100 MG tablet Take 1 tablet (100 mg total) by mouth 2 (two) times daily. Patient not taking: Reported on 07/29/2022 07/26/22   Johney Maine, MD  amitriptyline (ELAVIL) 10 MG tablet TAKE 1 TABLET BY MOUTH EVERYDAY AT BEDTIME Patient not taking: Reported on 07/29/2022 07/23/22   Nestor Ramp, MD  gabapentin (NEURONTIN) 100 MG capsule Take 1 capsule (100 mg total) by mouth at bedtime for 14 days, THEN 2 capsules (200 mg total) at bedtime. 05/31/22 07/14/22  Claudie Leach, DO  predniSONE (DELTASONE) 20 MG tablet Take  3 tablets (60 mg total) by mouth daily. Take with food on days 2-6 of chemotherapy. Patient not taking: Reported on 07/29/2022 07/26/22   Johney Maine, MD    Inpatient Medications: Scheduled Meds:  Chlorhexidine Gluconate Cloth  6 each Topical Daily   enoxaparin (LOVENOX) injection  40 mg Subcutaneous Q24H   feeding supplement (KATE FARMS STANDARD 1.4)  325 mL Oral BID BM   multivitamin with minerals  1 tablet Oral Daily   polyethylene glycol  17 g Oral Daily   QUEtiapine  25 mg Oral QHS   senna  1 tablet Oral Daily   Continuous Infusions:  amiodarone 30 mg/hr (08/02/22 0947)   ceFEPime (MAXIPIME) IV Stopped (08/02/22 0826)   PRN Meds: acetaminophen, lip balm, morphine injection, ondansetron **OR** ondansetron (ZOFRAN) IV, mouth rinse, sodium chloride  Allergies:    Allergies  Allergen Reactions   Amitriptyline Other (See Comments)    Nightmares, Night sweats, Migraines    Amlodipine Swelling    BILATERAL FEET TO ANKLES   Neurontin [Gabapentin] Other (See Comments)    Nightmares; Night sweats; Headache    Social History:   Social History   Socioeconomic History   Marital status: Married    Spouse name: Not on file   Number of children: Not on file   Years of education: Not on file   Highest education level: Not on file  Occupational History   Not on file  Tobacco Use   Smoking status: Never   Smokeless tobacco: Never  Vaping Use   Vaping Use: Never used  Substance and Sexual Activity   Alcohol use: Yes    Comment: at least one 40 oz beer daily   Drug use: Never   Sexual activity: Not on file  Other Topics Concern   Not on file  Social History Narrative   Not on file   Social Determinants of Health   Financial Resource Strain: Not on file  Food Insecurity: No Food Insecurity (07/30/2022)   Hunger Vital Sign    Worried About Running Out of Food in the Last Year: Never true    Ran Out of Food in the Last Year: Never true  Transportation Needs: No  Transportation Needs (07/30/2022)   PRAPARE - Administrator, Civil Service (Medical): No    Lack of Transportation (Non-Medical): No  Physical Activity: Not on file  Stress: Not on file  Social Connections: Not on file  Intimate Partner Violence: Not At Risk (07/30/2022)   Humiliation, Afraid, Rape, and Kick questionnaire    Fear of Current or Ex-Partner: No    Emotionally Abused: No    Physically Abused: No  Sexually Abused: No    Family History:   Family History  Problem Relation Age of Onset   Stroke Father     ROS:  Please see the history of present illness.  Difficult to get full ROS due to language barrier.  Physical Exam/Data:   Vitals:   08/02/22 0400 08/02/22 0600 08/02/22 0800 08/02/22 1000  BP: 116/68 (!) 122/56 125/69 121/61  Pulse: 95 97 94 93  Resp: 19 (!) 23 (!) 29 (!) 21  Temp: 100.1 F (37.8 C)  98.4 F (36.9 C)   TempSrc: Axillary  Oral   SpO2: 100% 95% 96% 98%  Weight:      Height:        Intake/Output Summary (Last 24 hours) at 08/02/2022 1155 Last data filed at 08/02/2022 0947 Gross per 24 hour  Intake 1213.32 ml  Output 950 ml  Net 263.32 ml      08/01/2022    1:08 AM 07/30/2022   12:00 AM 07/29/2022    3:02 PM  Last 3 Weights  Weight (lbs) 195 lb 15.8 oz 194 lb 0.1 oz 185 lb  Weight (kg) 88.9 kg 88 kg 83.915 kg     Body mass index is 30.7 kg/m.  General: 72 y.o. male resting comfortably in no acute distress. HEENT: Normocephalic and atraumatic. Sclera clear.  Neck: Supple. No JVD. Heart: RRR. No murmurs, gallops, or rubs. . Lungs: No increased work of breathing. Diminished breath sounds noted bilateral with some expiratory wheezes. No rales.  Abdomen: Soft, distended, and tender to palpation.  Extremities: No lower extremity edema.    Skin: Warm and dry. Neuro: Alert and oriented x3. No focal deficits. Psych: Normal affect. Responds appropriately.   EKG:  The EKG was personally reviewed and demonstrates:  Initial EKG on  07/29/2022 showed sinus tachycardia, rate 115 bpm, with no acute ST/T changes. Repeat EKG on 08/01/2019 showed atrial fibrillation, rate 220 bpm, with some diffuse ST depression. Telemetry:  Telemetry was personally reviewed and demonstrates:  Currently in sinus rhythm but having episodes of paroxysmal atrial fibrillation. When in sinus rhythm, rates in the 90s. When in atrial fibrillation, rates can be as high as the 170s to 190s.  Relevant CV Studies:  Echocardiogram 07/29/2022: Impressions: 1. Intracavitary gradient is realted to dynamic function. Left  ventricular ejection fraction, by estimation, is >75%. The left ventricle  has hyperdynamic function. The left ventricle has no regional wall motion  abnormalities. There is mild concentric  left ventricular hypertrophy. Left ventricular diastolic parameters were  normal.   2. Right ventricular systolic function is hyperdynamic. The right  ventricular size is normal. Tricuspid regurgitation signal is inadequate  for assessing PA pressure.   3. The mitral valve is normal in structure. No evidence of mitral valve  regurgitation.   4. The aortic valve is tricuspid. There is mild calcification of the  aortic valve. Aortic valve regurgitation is not visualized. No aortic  stenosis is present.   Comparison(s): No significant change from prior study.    Laboratory Data:  High Sensitivity Troponin:   Recent Labs  Lab 07/31/22 2354 08/01/22 0251  TROPONINIHS 12 14     Chemistry Recent Labs  Lab 07/31/22 0405 07/31/22 2254 08/01/22 0251 08/02/22 0004  NA 128*  --  124* 125*  K 4.0  --  3.6 4.0  CL 96*  --  95* 97*  CO2 21*  --  20* 19*  GLUCOSE 82  --  112* 117*  BUN 16  --  22 24*  CREATININE 0.70  --  0.84 0.82  CALCIUM 7.9*  --  7.6* 7.5*  MG  --  1.8  --  1.7  GFRNONAA >60  --  >60 >60  ANIONGAP 11  --  9 9    Recent Labs  Lab 07/30/22 0410 07/31/22 0405 08/01/22 0251  PROT 5.1* 4.6* 4.7*  ALBUMIN 2.3* 2.0* 2.0*  AST  39 38 45*  ALT 34 29 34  ALKPHOS 89 72 74  BILITOT 1.8* 2.2* 2.0*   Lipids No results for input(s): "CHOL", "TRIG", "HDL", "LABVLDL", "LDLCALC", "CHOLHDL" in the last 168 hours.  Hematology Recent Labs  Lab 07/31/22 0405 08/01/22 0251 08/02/22 0004  WBC 12.2* 10.2 10.1  RBC 2.61* 2.96* 2.76*  HGB 6.8* 8.0* 7.4*  HCT 21.9* 24.7* 23.4*  MCV 83.9 83.4 84.8  MCH 26.1 27.0 26.8  MCHC 31.1 32.4 31.6  RDW 21.5* 20.3* 20.4*  PLT 91* 87* 92*   Thyroid  Recent Labs  Lab 08/01/22 0620  TSH 3.619    BNPNo results for input(s): "BNP", "PROBNP" in the last 168 hours.  DDimer No results for input(s): "DDIMER" in the last 168 hours.   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 intake, tachycardia and episode of shaking and unresponsiveness then confused thereafter. EEG to evaluate for seizure Level of alertness: Awake AEDs during EEG study: None Technical aspects: This EEG study was done with scalp electrodes positioned according to the 10-20 International system of electrode placement. Electrical activity was reviewed with band pass filter of 1-70Hz , sensitivity of 7 uV/mm, display speed of 63mm/sec with a 60Hz  notched filter applied as appropriate. EEG data were recorded continuously and digitally stored.  Video monitoring was available and reviewed as appropriate. Description: The posterior dominant rhythm consists of 7.5 Hz activity of moderate voltage (25-35 uV) seen predominantly in posterior head regions, symmetric and reactive to eye opening and eye closing. EEG showed intermittent generalized sharply contoured high amplitude 2-3hz  delta slowing.  Hyperventilation and photic stimulation were not performed.   ABNORMALITY - Intermittent slow, generalized IMPRESSION: This study is suggestive of mild diffuse encephalopathy, nonspecific etiology. No seizures or epileptiform discharges were seen throughout the recording.  If suspicion for interictal activity remains a concern, a prolonged study can be considered. Charlsie Quest   DG Abd 1 View  Result Date: 08/01/2022 CLINICAL DATA:  Abdominal distension. EXAM: ABDOMEN - 1 VIEW COMPARISON:  CT AP 07/29/2022 FINDINGS: No dilated loops of large or small bowel identified. Stool noted within the colon. No abnormal abdominal or pelvic calcifications. Visualized osseous structures appear intact. IMPRESSION: Nonobstructive bowel gas pattern. Electronically Signed   By: Signa Kell M.D.   On: 08/01/2022 12:41   DG Chest Port 1 View  Result Date: 07/31/2022 CLINICAL DATA:  Possible aspiration EXAM: PORTABLE CHEST 1 VIEW COMPARISON:  07/29/2022, 07/07/2022 FINDINGS: Cardiac shadow is stable. Widened mediastinum is again identified accentuated by the portable technique. This is consistent with the known mediastinal adenopathy seen on prior CT. Lungs are well aerated bilaterally. Mild bibasilar atelectasis is seen. No sizable effusion is noted. No bony abnormality is seen. IMPRESSION: Widened mediastinum consistent with the known mediastinal  adenopathy. Mild bibasilar atelectasis. Electronically Signed   By: Alcide Clever M.D.   On: 07/31/2022 23:06   CT MAXILLOFACIAL WO CONTRAST  Result Date: 07/30/2022 CLINICAL DATA:  Right facial pain EXAM: CT MAXILLOFACIAL WITHOUT CONTRAST TECHNIQUE: Multidetector CT imaging of the maxillofacial structures was performed. Multiplanar CT image reconstructions were also generated. RADIATION DOSE REDUCTION: This exam was performed according to the departmental dose-optimization program which includes automated exposure control, adjustment of the mA and/or kV according to patient  size and/or use of iterative reconstruction technique. COMPARISON:  None Available. FINDINGS: Osseous: No fracture or mandibular dislocation. No destructive process. Large periapical lucency at the root of the most posterior remaining right maxillary 2. Orbits: Negative. No traumatic or inflammatory finding. Sinuses: Right anterior ethmoid and maxillary sinus complete opacification with polypoid contours. Soft tissues: Multiple enlarged submandibular lymph nodes, measuring up to 1.6 cm. No abscess or fluid collection. Limited intracranial: No significant or unexpected finding. IMPRESSION: 1. Large periapical lucency at the root of the most posterior remaining right maxillary tooth. No associated abscess. 2. Right anterior ethmoid and maxillary sinus complete opacification with polypoid contours, possibly antrochoanal polyp. 3. Multiple enlarged submandibular lymph nodes, measuring up to 1.6 cm, likely reactive. Electronically Signed   By: Deatra Robinson M.D.   On: 07/30/2022 20:38   MR BRAIN W WO CONTRAST  Result Date: 07/30/2022 CLINICAL DATA:  Seizure and headache EXAM: MRI HEAD WITHOUT AND WITH CONTRAST TECHNIQUE: Multiplanar, multiecho pulse sequences of the brain and surrounding structures were obtained without and with intravenous contrast. CONTRAST:  9mL GADAVIST GADOBUTROL 1 MMOL/ML IV SOLN COMPARISON:  None Available. FINDINGS: Brain: No acute infarct, mass effect or extra-axial collection. No acute or chronic hemorrhage. There is multifocal hyperintense T2-weighted signal within the white matter. Parenchymal volume and CSF spaces are normal. The midline structures are normal. There is no abnormal contrast enhancement. Vascular: Major flow voids are preserved. Skull and upper cervical spine: Normal calvarium and skull base. Visualized upper cervical spine and soft tissues are normal. Sinuses/Orbits:Opacification of the right maxillary and right anterior ethmoid sinuses. Normal orbits. IMPRESSION: 1. No  acute intracranial abnormality. 2. Multifocal hyperintense T2-weighted signal within the white matter, most consistent with chronic small vessel ischemia. 3. Polypoid lesion of the right maxillary and ethmoid sinuses, possibly antrochoanal polyp. Nonemergent ENT consultation might be helpful. Electronically Signed   By: Deatra Robinson M.D.   On: 07/30/2022 20:34   CT ABDOMEN PELVIS WO CONTRAST  Result Date: 07/29/2022 CLINICAL DATA:  Acute abdominal pain. EXAM: CT ABDOMEN AND PELVIS WITHOUT CONTRAST TECHNIQUE: Multidetector CT imaging of the abdomen and pelvis was performed following the standard protocol without IV contrast. RADIATION DOSE REDUCTION: This exam was performed according to the departmental dose-optimization program which includes automated exposure control, adjustment of the mA and/or kV according to patient size and/or use of iterative reconstruction technique. COMPARISON:  CT examination dated Jul 15, 2022 FINDINGS: Lower chest: Bibasilar subsegmental atelectasis. Hepatobiliary: No focal liver abnormality is seen. No gallstones, gallbladder wall thickening, or biliary dilatation. Pancreas: Mild generalized pancreatic atrophy. No pancreatic ductal dilatation or surrounding inflammatory changes. Spleen: Normal in size without focal abnormality. Adrenals/Urinary Tract: Adrenal glands are unremarkable. Kidneys are normal, without renal calculi, focal lesion, or hydronephrosis. Bilateral exophytic renal cysts, likely simple cysts. Bladder is unremarkable. Stomach/Bowel: Stomach is within normal limits. Appendix appears normal. Scattered colonic diverticulosis without evidence of acute diverticulitis. No evidence of bowel wall thickening, distention, or inflammatory changes. Vascular/Lymphatic: No significant vascular findings are present. There  numerous lymph nodes of multiple sizes which are para-aortic, mesenteric, porta hepatis as well as multiple lymph nodes about the spleen as well as few lymph  nodes in the pelvis. These are not significantly changed since prior examination. Reproductive: Postsurgical changes in the prostate bed. Other: Scattered soft tissue densities in the anterior abdominal wall, which may represent lymph nodes. No abdominopelvic ascites. Musculoskeletal: Compression fracture of the L4 vertebral body is unchanged. No acute osseous abnormality. IMPRESSION: 1. No CT evidence of acute abdominal/pelvic process. 2. Scattered colonic diverticulosis without evidence of acute diverticulitis. 3. Numerous lymph nodes of multiple sizes are present throughout the abdomen and pelvis predominantly in the mesentery and para-aortic region, which are not significantly changed since prior examination. 4. Chronic compression fracture of the L4 vertebral body. Electronically Signed   By: Larose Hires D.O.   On: 07/29/2022 19:26   CT Head Wo Contrast  Result Date: 07/29/2022 CLINICAL DATA:  Seizures, new onset. EXAM: CT HEAD WITHOUT CONTRAST TECHNIQUE: Contiguous axial images were obtained from the base of the skull through the vertex without intravenous contrast. RADIATION DOSE REDUCTION: This exam was performed according to the departmental dose-optimization program which includes automated exposure control, adjustment of the mA and/or kV according to patient size and/or use of iterative reconstruction technique. COMPARISON:  None Available. FINDINGS: Brain: No evidence of acute infarction, hemorrhage, hydrocephalus, extra-axial collection or mass lesion/mass effect. Vascular: No hyperdense vessel or unexpected calcification. Skull: Normal. Negative for fracture or focal lesion. Sinuses/Orbits: Mucosal thickening of the ethmoid air cells. Other: None. IMPRESSION: 1. No acute intracranial abnormality. 2. Mucosal thickening of the ethmoid air cells. Electronically Signed   By: Larose Hires D.O.   On: 07/29/2022 19:15   ECHOCARDIOGRAM COMPLETE  Result Date: 07/29/2022    ECHOCARDIOGRAM REPORT   Patient  Name:   Landon Noboa Date of Exam: 07/29/2022 Medical Rec #:  [id number]    Height:       67.0 in Accession #:    [id number]   Weight:       185.9 lb Date of Birth:  1950-09-01    BSA:          1.960 m Patient Age:    72 years     BP:           111/73 mmHg Patient Gender: M            HR:           117 bpm. Exam Location:  Outpatient Procedure: 2D Echo, Color Doppler and Cardiac Doppler Indications:    Chemo  History:        Patient has prior history of Echocardiogram examinations, most                 recent 12/16/2021. Risk Factors:Hypertension. Prostate Cancer,                 Lymphoma, Chemotherapy.  Sonographer:    Milbert Coulter Referring Phys: Johney Maine  Sonographer Comments: Image acquisition challenging due to respiratory motion and Tachycardia. IMPRESSIONS  1. Intracavitary gradient is realted to dynamic function. Left ventricular ejection fraction, by estimation, is >75%. The left ventricle has hyperdynamic function. The left ventricle has no regional wall motion abnormalities. There is mild concentric left ventricular hypertrophy. Left ventricular diastolic parameters were normal.  2. Right ventricular systolic function is hyperdynamic. The right ventricular size is normal. Tricuspid regurgitation signal is inadequate for assessing PA pressure.  3. The mitral valve is normal in structure.  No evidence of mitral valve regurgitation.  4. The aortic valve is tricuspid. There is mild calcification of the aortic valve. Aortic valve regurgitation is not visualized. No aortic stenosis is present. Comparison(s): No significant change from prior study. FINDINGS  Left Ventricle: Intracavitary gradient is realted to dynamic function. Left ventricular ejection fraction, by estimation, is >75%. The left ventricle has hyperdynamic function. The left ventricle has no regional wall motion abnormalities. The left ventricular internal cavity size was small. There is mild concentric left ventricular hypertrophy. Left  ventricular diastolic parameters were normal. Right Ventricle: The right ventricular size is normal. No increase in right ventricular wall thickness. Right ventricular systolic function is hyperdynamic. Tricuspid regurgitation signal is inadequate for assessing PA pressure. Left Atrium: Left atrial size was normal in size. Right Atrium: Right atrial size was normal in size. Pericardium: Trivial pericardial effusion is present. The pericardial effusion is posterior to the left ventricle. Mitral Valve: The mitral valve is normal in structure. No evidence of mitral valve regurgitation. Tricuspid Valve: The tricuspid valve is normal in structure. Tricuspid valve regurgitation is not demonstrated. Aortic Valve: The aortic valve is tricuspid. There is mild calcification of the aortic valve. Aortic valve regurgitation is not visualized. No aortic stenosis is present. Aortic valve mean gradient measures 10.0 mmHg. Aortic valve peak gradient measures 18.5 mmHg. Aortic valve area, by VTI measures 2.53 cm. Pulmonic Valve: The pulmonic valve was not well visualized. Pulmonic valve regurgitation is not visualized. Aorta: The aortic root and ascending aorta are structurally normal, with no evidence of dilitation. IAS/Shunts: The interatrial septum was not well visualized.  LEFT VENTRICLE PLAX 2D LVIDd:         4.00 cm     Diastology LVIDs:         1.80 cm     LV e' medial:    8.24 cm/s LV PW:         1.10 cm     LV E/e' medial:  7.5 LV IVS:        1.20 cm     LV e' lateral:   11.70 cm/s LVOT diam:     2.10 cm     LV E/e' lateral: 5.3 LV SV:         72 LV SV Index:   37 LVOT Area:     3.46 cm  LV Volumes (MOD) LV vol d, MOD A2C: 27.2 ml LV vol d, MOD A4C: 49.7 ml LV vol s, MOD A2C: 7.1 ml LV vol s, MOD A4C: 13.6 ml LV SV MOD A2C:     20.1 ml LV SV MOD A4C:     49.7 ml LV SV MOD BP:      26.5 ml RIGHT VENTRICLE TAPSE (M-mode): 2.0 cm LEFT ATRIUM             Index LA diam:        3.50 cm 1.79 cm/m LA Vol (A2C):   26.6 ml 13.57  ml/m LA Vol (A4C):   36.9 ml 18.82 ml/m LA Biplane Vol: 31.3 ml 15.97 ml/m  AORTIC VALVE AV Area (Vmax):    2.77 cm AV Area (Vmean):   2.66 cm AV Area (VTI):     2.53 cm AV Vmax:           215.00 cm/s AV Vmean:          146.000 cm/s AV VTI:            0.285 m AV Peak Grad:  18.5 mmHg AV Mean Grad:      10.0 mmHg LVOT Vmax:         172.00 cm/s LVOT Vmean:        112.000 cm/s LVOT VTI:          0.208 m LVOT/AV VTI ratio: 0.73  AORTA Ao Asc diam: 2.90 cm MITRAL VALVE MV Area (PHT): 4.49 cm    SHUNTS MV Decel Time: 169 msec    Systemic VTI:  0.21 m MV E velocity: 61.90 cm/s  Systemic Diam: 2.10 cm MV A velocity: 89.20 cm/s MV E/A ratio:  0.69 Riley Lam MD Electronically signed by Riley Lam MD Signature Date/Time: 07/29/2022/4:01:01 PM    Final    DG Chest Port 1 View  Result Date: 07/29/2022 CLINICAL DATA:  Cough.  Syncope. EXAM: PORTABLE CHEST 1 VIEW COMPARISON:  May 23, 2022.  Jul 07, 2022. FINDINGS: Prominence of the superior mediastinum is noted consistent with adenopathy as noted on prior CT scan. Stable cardiac size. Lungs are clear. Bony thorax is unremarkable. IMPRESSION: Widening of superior mediastinum consistent with adenopathy noted on prior CT scan of Jul 07, 2022. This is concerning for lymphoma or metastatic disease. Electronically Signed   By: Lupita Raider M.D.   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  He was on Eliquis for about 6 months but and then this was stopped due to hematuria.  He has had no documented atrial fibrillation since then until this admission.  He presented after brief syncopal episode and was found to be hypotensive and febrile.  He was initially in sinus tachycardia and then went into rapid atrial fibrillation with rates as high as the 220s.  He was started on IV amiodarone given soft BP and converted  back to normal sinus rhythm but has continued to have shorter episodes of atrial fibrillation. Echo on 07/29/2022 showed LVEF of >75%. - Currently in normal sinus rhythm with rates in the 90s. - Potassium and magnesium within normal limits.  TSH normal. - Continue IV Amiodarone for now. - BP has improved and he has been receiving as needed doses of IV metoprolol.  Therefore we will start p.o. Lopressor 25 mg twice daily. - CHA2DS2-VASc score = 5 (coronary calcifications, HTN, prior PE, age). However, unable to be anticoagulated due to issues with recurrent anemia. Hemoglobin was as low as 6.4 on admission. S/p a total of 3 units of PRBCs this admission. Hemoglobin down from 8.0 yesterday to 7.4 today.  - Suspect atrial fibrillation is likely due to underlying infection (although unclear source of sepsis at this time) and will continue to improved as this is treated. Did discuss with daughter who is a NP that their is a risk of stroke while on Amiodarone and not anticoagulation but we have limited options at this time. She voiced understanding.   Syncope Patient was admitted after a brief syncopal episode. This occurred in the setting of hypotension (BP 70/40) due to underlying sepsis and hypovolemia from nausea/ vomiting and poor PO intake. Echo on 07/29/2022 showed LVEF of >75% and no significant valvular disease.  - No recurrence.   Hypertension History of hypertension but has had more problems with hypotension recently in setting of sepsis. - BP has stablized.  - Continue to hold home Lisinopril.  - Will add Lopressor as above to help with paroxysmal  atrial fibrillation.  Otherwise, per primary team: - Sepsis/ lactic acidosis - Right facial paresthesias  - Chronic hyponatremia - Constipation - Diffuse B-cell lymphoma - L4 compression fracture - Acute on chronic anemia: hemoccult pending - Wheezing   Risk Assessment/Risk Scores:    CHA2DS2-VASc Score = 5  This indicates a 7.2% annual  risk of stroke. The patient's score is based upon: CHF History: 0 HTN History: 1 Diabetes History: 0 Stroke History: 2 (VTE- prior PE) Vascular Disease History: 1 (coronary artery calcifications on prior CT) Age Score: 1 Gender Score: 0   For questions or updates, please contact [facility] Please consult [email or web address] for contact info under    Signed, Corrin Parker, PA-C  08/02/2022 11:55 AM    Attending Note:   The patient was seen and examined.  Agree with assessment and plan as noted above.  Changes made to the above note as needed.  Patient seen and independently examined with Marjie Skiff, PA .   We discussed all aspects of the encounter. I agree with the assessment and plan as stated above.    Atrial fib:   still in Afib.  Rate is fairly well controlled.  Would continue with IV amio for now .   He still has poor PO intake and I would continue with IV amio until we can be assured that he can take and absorb PO meds.  He has persistent anemia and has had several transfusions over the past few days. I would want to start anticoagulation until he is more stable from a hemotology standpoint  Will defer to hematology as to if / when he would ever be a candidate for anticoagulation .     2.  B cell lymphoma:  I do not have a good understanding of what his prognosis is with this lymphoma.    I have spent a total of 40 minutes with patient reviewing hospital  notes , telemetry, EKGs, labs and examining patient as well as establishing an assessment and plan that was discussed with the patient.  > 50% of time was spent in direct patient care.    Vesta Mixer, Montez Hageman., MD, [facility] 08/02/2022, 1:30 PM [address],  [address] Office [phone] Pager [phone]

## 2022-08-02 NOTE — Progress Notes (Signed)
[name]  HEMATOLOGY/ONCOLOGY INPATIENT PROGRESS NOTE  Date of Service: 08/02/2022  Inpatient Attending: .Meredeth Ide, MD   SUBJECTIVE  Patient with oncology follow-up.  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  Patient presented to the emergency room with poor p.o. intake tachycardia and an episode of shaking and unresponsiveness.  He was noted to have low-grade fever and lactic acidosis and was treated for possible sepsis as well as seizure. He has been on vancomycin and cefepime with blood cultures negative to date. Noted to have paroxysmal atrial fibrillation with rapid heart rates.  Also noted to have significant anemia requiring PRBC transfusion with no overt evidence of GI bleeding. Patient was noted to have seizure-like activity MRI was done that showed chronic small vessel ischemia.  EKG showed mild diffuse encephalopathy no seizures or epileptiform discharges.  I was consulted for hematology oncology follow-up with regards to treatment of his lymphoma and in the context of significant anemia and thrombocytopenia.  Patient was seen with his wife at bedside.  Appears more awake with no overt confusion at this time.  We discussed that we are inclined to restart his chemotherapy as inpatient if there is no overt evidence of sepsis and he is more stable over the weekend.  Patient is agreeable to this.    OBJECTIVE:  NAD  PHYSICAL EXAMINATION: . Vitals:   08/02/22 1600 08/02/22 1607 08/02/22 1700 08/02/22 2000  BP:   119/60   Pulse: 96  97   Resp: (!) 27  20   Temp:  99.6 F (37.6 C)  98.4 F (36.9 C)  TempSrc:  Oral  Oral  SpO2: 98%  98%   Weight:      Height:       Filed Weights   07/29/22 1502 07/30/22 0000 08/01/22 0108  Weight: 185 lb (83.9 kg) 194 lb 0.1 oz (88 kg) 195 lb 15.8 oz (88.9 kg)   .Body mass index is 30.7 kg/m.  GENERAL:alert, in no acute  distress OROPHARYNX:MMM  NECK: supple, no JVD, thyroid normal size, non-tender, without nodularity LYMPH:  no palpable lymphadenopathy in the cervical, axillary or inguinal LUNGS: clear to auscultation with normal respiratory effort HEART: irreg ABDOMEN: abdomen soft, non-tender, slightly distended PSYCH: alert & oriented x 3 with fluent speech NEURO: no focal motor/sensory deficits  MEDICAL HISTORY:  Past Medical History:  Diagnosis Date   Cancer (HCC)    prostate   Hypertension     SURGICAL HISTORY: Past Surgical History:  Procedure Laterality Date   LYMPHADENECTOMY Bilateral 09/01/2019   Procedure: LYMPHADENECTOMY;  Surgeon: Sebastian Ache, MD;  Location: [facility];  Service: Urology;  Laterality: Bilateral;   ROBOT ASSISTED LAPAROSCOPIC RADICAL PROSTATECTOMY N/A 09/01/2019   Procedure: XI ROBOTIC ASSISTED LAPAROSCOPIC RADICAL PROSTATECTOMY;  Surgeon: Sebastian Ache, MD;  Location: [facility];  Service: Urology;  Laterality: N/A;  3 HRS    SOCIAL HISTORY: Social History   Socioeconomic History   Marital status: Married    Spouse name: Not on file   Number of children: Not on file   Years of education: Not on file   Highest education level: Not on file  Occupational History   Not on file  Tobacco Use   Smoking status: Never   Smokeless tobacco: Never  Vaping Use   Vaping Use: Never used  Substance and Sexual Activity   Alcohol use: Yes    Comment: at least one  40 oz beer daily   Drug use: Never   Sexual activity: Not on file  Other Topics Concern   Not on file  Social History Narrative   Not on file   Social Determinants of Health   Financial Resource Strain: Not on file  Food Insecurity: No Food Insecurity (07/30/2022)   Hunger Vital Sign    Worried About Running Out of Food in the Last Year: Never true    Ran Out of Food in the Last Year: Never true  Transportation Needs: No Transportation Needs (07/30/2022)   PRAPARE - Administrator, Civil Service  (Medical): No    Lack of Transportation (Non-Medical): No  Physical Activity: Not on file  Stress: Not on file  Social Connections: Not on file  Intimate Partner Violence: Not At Risk (07/30/2022)   Humiliation, Afraid, Rape, and Kick questionnaire    Fear of Current or Ex-Partner: No    Emotionally Abused: No    Physically Abused: No    Sexually Abused: No    FAMILY HISTORY: Family History  Problem Relation Age of Onset   Stroke Father     ALLERGIES:  is allergic to amitriptyline, amlodipine, and neurontin [gabapentin].  MEDICATIONS:  Scheduled Meds:  Chlorhexidine Gluconate Cloth  6 each Topical Daily   enoxaparin (LOVENOX) injection  40 mg Subcutaneous Q24H   feeding supplement (KATE FARMS STANDARD 1.4)  325 mL Oral BID BM   metoprolol tartrate  25 mg Oral BID   multivitamin with minerals  1 tablet Oral Daily   polyethylene glycol  17 g Oral Daily   QUEtiapine  25 mg Oral QHS   senna  1 tablet Oral Daily   Continuous Infusions:  amiodarone 30 mg/hr (08/02/22 1653)   ceFEPime (MAXIPIME) IV Stopped (08/02/22 1556)   PRN Meds:.acetaminophen, lip balm, metoprolol tartrate, morphine injection, ondansetron **OR** ondansetron (ZOFRAN) IV, mouth rinse, sodium chloride  REVIEW OF SYSTEMS:    10 Point review of Systems was done is negative except as noted above.   LABORATORY DATA:  I have reviewed the data as listed  .    Latest Ref Rng & Units 08/02/2022   12:04 AM 08/01/2022    2:51 AM 07/31/2022    4:05 AM  CBC  WBC 4.0 - 10.5 K/uL 10.1  10.2  12.2   Hemoglobin 13.0 - 17.0 g/dL 7.4  8.0  6.8   Hematocrit 39.0 - 52.0 % 23.4  24.7  21.9   Platelets 150 - 400 K/uL 92  87  91     .    Latest Ref Rng & Units 08/02/2022   12:04 AM 08/01/2022    2:51 AM 07/31/2022    4:05 AM  CMP  Glucose 70 - 99 mg/dL 295  621  82   BUN 8 - 23 mg/dL 24  22  16    Creatinine 0.61 - 1.24 mg/dL 3.08  6.57  8.46   Sodium 135 - 145 mmol/L 125  124  128   Potassium 3.5 - 5.1 mmol/L 4.0  3.6   4.0   Chloride 98 - 111 mmol/L 97  95  96   CO2 22 - 32 mmol/L 19  20  21    Calcium 8.9 - 10.3 mg/dL 7.5  7.6  7.9   Total Protein 6.5 - 8.1 g/dL  4.7  4.6   Total Bilirubin 0.3 - 1.2 mg/dL  2.0  2.2   Alkaline Phos 38 - 126 U/L  74  72  AST 15 - 41 U/L  45  38   ALT 0 - 44 U/L  34  29      RADIOGRAPHIC STUDIES: I have personally reviewed the radiological images as listed and agreed with the findings in the report. DG Chest Port 1V same Day  Result Date: 08/02/2022 CLINICAL DATA:  Dyspnea shortness of breath EXAM: PORTABLE CHEST 1 VIEW COMPARISON:  Multiple exams, including 07/31/2022 FINDINGS: Substantially widened mediastinum at 14.9 cm presumably due to adenopathy given findings on prior imaging workups. This is stable compared to 07/31/2022 and substantially increased from 05/23/2022, and questionably increased from 07/06/2022 (AP projection on today's exam may be magnify the mediastinum more than prior PA projections and CT imaging). Heart size within normal limits. Hazy indistinct density at the left lung base possibly from atelectasis or pneumonia, minimally improved from previous. IMPRESSION: 1. Substantially widened mediastinum at 14.9 cm, presumably due to adenopathy given previous chest CT findings. 2. Hazy indistinct density at the left lung base possibly from atelectasis or pneumonia, minimally improved from previous. Electronically Signed   By: Gaylyn Rong M.D.   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 intake, tachycardia and episode of shaking and unresponsiveness then confused thereafter. EEG to evaluate for seizure Level of  alertness: Awake AEDs during EEG study: None Technical aspects: This EEG study was done with scalp electrodes positioned according to the 10-20 International system of electrode placement. Electrical activity was reviewed with band pass filter of 1-70Hz , sensitivity of 7 uV/mm, display speed of 68mm/sec with a 60Hz  notched filter applied as appropriate. EEG data were recorded continuously and digitally stored.  Video monitoring was available and reviewed as appropriate. Description: The posterior dominant rhythm consists of 7.5 Hz activity of moderate voltage (25-35 uV) seen predominantly in posterior head regions, symmetric and reactive to eye opening and eye closing. EEG showed intermittent generalized sharply contoured high amplitude 2-3hz  delta slowing. Hyperventilation and photic stimulation were not performed.   ABNORMALITY - Intermittent slow, generalized IMPRESSION: This study is suggestive of mild diffuse encephalopathy, nonspecific etiology. No seizures or epileptiform discharges were seen throughout the recording.  If suspicion for interictal activity remains a concern, a prolonged study can be considered. Charlsie Quest   DG Abd 1 View  Result Date: 08/01/2022 CLINICAL DATA:  Abdominal distension. EXAM: ABDOMEN - 1 VIEW COMPARISON:  CT AP 07/29/2022 FINDINGS: No dilated loops of large or small bowel identified. Stool noted within the colon. No abnormal abdominal or pelvic calcifications. Visualized osseous structures appear intact. IMPRESSION: Nonobstructive bowel gas pattern. Electronically Signed   By: Signa Kell M.D.   On: 08/01/2022 12:41   DG Chest Port 1 View  Result Date: 07/31/2022 CLINICAL DATA:  Possible aspiration EXAM: PORTABLE CHEST 1 VIEW COMPARISON:  07/29/2022, 07/07/2022 FINDINGS: Cardiac shadow is stable. Widened mediastinum is again identified accentuated by the portable technique. This is consistent with the known mediastinal adenopathy seen on prior CT. Lungs are well  aerated bilaterally. Mild bibasilar atelectasis is seen. No sizable effusion is noted. No bony abnormality is seen. IMPRESSION: Widened mediastinum consistent with the known mediastinal adenopathy. Mild bibasilar atelectasis. Electronically Signed   By:  Alcide Clever M.D.   On: 07/31/2022 23:06   CT MAXILLOFACIAL WO CONTRAST  Result Date: 07/30/2022 CLINICAL DATA:  Right facial pain EXAM: CT MAXILLOFACIAL WITHOUT CONTRAST TECHNIQUE: Multidetector CT imaging of the maxillofacial structures was performed. Multiplanar CT image reconstructions were also generated. RADIATION DOSE REDUCTION: This exam was performed according to the departmental dose-optimization program which includes automated exposure control, adjustment of the mA and/or kV according to patient size and/or use of iterative reconstruction technique. COMPARISON:  None Available. FINDINGS: Osseous: No fracture or mandibular dislocation. No destructive process. Large periapical lucency at the root of the most posterior remaining right maxillary 2. Orbits: Negative. No traumatic or inflammatory finding. Sinuses: Right anterior ethmoid and maxillary sinus complete opacification with polypoid contours. Soft tissues: Multiple enlarged submandibular lymph nodes, measuring up to 1.6 cm. No abscess or fluid collection. Limited intracranial: No significant or unexpected finding. IMPRESSION: 1. Large periapical lucency at the root of the most posterior remaining right maxillary tooth. No associated abscess. 2. Right anterior ethmoid and maxillary sinus complete opacification with polypoid contours, possibly antrochoanal polyp. 3. Multiple enlarged submandibular lymph nodes, measuring up to 1.6 cm, likely reactive. Electronically Signed   By: Deatra Robinson M.D.   On: 07/30/2022 20:38   MR BRAIN W WO CONTRAST  Result Date: 07/30/2022 CLINICAL DATA:  Seizure and headache EXAM: MRI HEAD WITHOUT AND WITH CONTRAST TECHNIQUE: Multiplanar, multiecho pulse sequences of  the brain and surrounding structures were obtained without and with intravenous contrast. CONTRAST:  9mL GADAVIST GADOBUTROL 1 MMOL/ML IV SOLN COMPARISON:  None Available. FINDINGS: Brain: No acute infarct, mass effect or extra-axial collection. No acute or chronic hemorrhage. There is multifocal hyperintense T2-weighted signal within the white matter. Parenchymal volume and CSF spaces are normal. The midline structures are normal. There is no abnormal contrast enhancement. Vascular: Major flow voids are preserved. Skull and upper cervical spine: Normal calvarium and skull base. Visualized upper cervical spine and soft tissues are normal. Sinuses/Orbits:Opacification of the right maxillary and right anterior ethmoid sinuses. Normal orbits. IMPRESSION: 1. No acute intracranial abnormality. 2. Multifocal hyperintense T2-weighted signal within the white matter, most consistent with chronic small vessel ischemia. 3. Polypoid lesion of the right maxillary and ethmoid sinuses, possibly antrochoanal polyp. Nonemergent ENT consultation might be helpful. Electronically Signed   By: Deatra Robinson M.D.   On: 07/30/2022 20:34   CT ABDOMEN PELVIS WO CONTRAST  Result Date: 07/29/2022 CLINICAL DATA:  Acute abdominal pain. EXAM: CT ABDOMEN AND PELVIS WITHOUT CONTRAST TECHNIQUE: Multidetector CT imaging of the abdomen and pelvis was performed following the standard protocol without IV contrast. RADIATION DOSE REDUCTION: This exam was performed according to the departmental dose-optimization program which includes automated exposure control, adjustment of the mA and/or kV according to patient size and/or use of iterative reconstruction technique. COMPARISON:  CT examination dated Jul 15, 2022 FINDINGS: Lower chest: Bibasilar subsegmental atelectasis. Hepatobiliary: No focal liver abnormality is seen. No gallstones, gallbladder wall thickening, or biliary dilatation. Pancreas: Mild generalized pancreatic atrophy. No pancreatic  ductal dilatation or surrounding inflammatory changes. Spleen: Normal in size without focal abnormality. Adrenals/Urinary Tract: Adrenal glands are unremarkable. Kidneys are normal, without renal calculi, focal lesion, or hydronephrosis. Bilateral exophytic renal cysts, likely simple cysts. Bladder is unremarkable. Stomach/Bowel: Stomach is within normal limits. Appendix appears normal. Scattered colonic diverticulosis without evidence of acute diverticulitis. No evidence of bowel wall thickening, distention, or inflammatory changes. Vascular/Lymphatic: No significant vascular findings are present. There numerous lymph nodes of multiple sizes which are para-aortic,  mesenteric, porta hepatis as well as multiple lymph nodes about the spleen as well as few lymph nodes in the pelvis. These are not significantly changed since prior examination. Reproductive: Postsurgical changes in the prostate bed. Other: Scattered soft tissue densities in the anterior abdominal wall, which may represent lymph nodes. No abdominopelvic ascites. Musculoskeletal: Compression fracture of the L4 vertebral body is unchanged. No acute osseous abnormality. IMPRESSION: 1. No CT evidence of acute abdominal/pelvic process. 2. Scattered colonic diverticulosis without evidence of acute diverticulitis. 3. Numerous lymph nodes of multiple sizes are present throughout the abdomen and pelvis predominantly in the mesentery and para-aortic region, which are not significantly changed since prior examination. 4. Chronic compression fracture of the L4 vertebral body. Electronically Signed   By: Larose Hires D.O.   On: 07/29/2022 19:26   CT Head Wo Contrast  Result Date: 07/29/2022 CLINICAL DATA:  Seizures, new onset. EXAM: CT HEAD WITHOUT CONTRAST TECHNIQUE: Contiguous axial images were obtained from the base of the skull through the vertex without intravenous contrast. RADIATION DOSE REDUCTION: This exam was performed according to the departmental  dose-optimization program which includes automated exposure control, adjustment of the mA and/or kV according to patient size and/or use of iterative reconstruction technique. COMPARISON:  None Available. FINDINGS: Brain: No evidence of acute infarction, hemorrhage, hydrocephalus, extra-axial collection or mass lesion/mass effect. Vascular: No hyperdense vessel or unexpected calcification. Skull: Normal. Negative for fracture or focal lesion. Sinuses/Orbits: Mucosal thickening of the ethmoid air cells. Other: None. IMPRESSION: 1. No acute intracranial abnormality. 2. Mucosal thickening of the ethmoid air cells. Electronically Signed   By: Larose Hires D.O.   On: 07/29/2022 19:15   ECHOCARDIOGRAM COMPLETE  Result Date: 07/29/2022    ECHOCARDIOGRAM REPORT   Patient Name:   Jeyden Konopa Date of Exam: 07/29/2022 Medical Rec #:  [id number]    Height:       67.0 in Accession #:    [id number]   Weight:       185.9 lb Date of Birth:  [birth year]    BSA:          1.960 m Patient Age:    72 years     BP:           111/73 mmHg Patient Gender: M            HR:           117 bpm. Exam Location:  Outpatient Procedure: 2D Echo, Color Doppler and Cardiac Doppler Indications:    Chemo  History:        Patient has prior history of Echocardiogram examinations, most                 recent 12/16/2021. Risk Factors:Hypertension. Prostate Cancer,                 Lymphoma, Chemotherapy.  Sonographer:    Milbert Coulter Referring Phys: Johney Maine  Sonographer Comments: Image acquisition challenging due to respiratory motion and Tachycardia. IMPRESSIONS  1. Intracavitary gradient is realted to dynamic function. Left ventricular ejection fraction, by estimation, is >75%. The left ventricle has hyperdynamic function. The left ventricle has no regional wall motion abnormalities. There is mild concentric left ventricular hypertrophy. Left ventricular diastolic parameters were normal.  2. Right ventricular systolic function is hyperdynamic.  The right ventricular size is normal. Tricuspid regurgitation signal is inadequate for assessing PA pressure.  3. The mitral valve is normal in structure. No evidence of mitral valve regurgitation.  4. The  aortic valve is tricuspid. There is mild calcification of the aortic valve. Aortic valve regurgitation is not visualized. No aortic stenosis is present. Comparison(s): No significant change from prior study. FINDINGS  Left Ventricle: Intracavitary gradient is realted to dynamic function. Left ventricular ejection fraction, by estimation, is >75%. The left ventricle has hyperdynamic function. The left ventricle has no regional wall motion abnormalities. The left ventricular internal cavity size was small. There is mild concentric left ventricular hypertrophy. Left ventricular diastolic parameters were normal. Right Ventricle: The right ventricular size is normal. No increase in right ventricular wall thickness. Right ventricular systolic function is hyperdynamic. Tricuspid regurgitation signal is inadequate for assessing PA pressure. Left Atrium: Left atrial size was normal in size. Right Atrium: Right atrial size was normal in size. Pericardium: Trivial pericardial effusion is present. The pericardial effusion is posterior to the left ventricle. Mitral Valve: The mitral valve is normal in structure. No evidence of mitral valve regurgitation. Tricuspid Valve: The tricuspid valve is normal in structure. Tricuspid valve regurgitation is not demonstrated. Aortic Valve: The aortic valve is tricuspid. There is mild calcification of the aortic valve. Aortic valve regurgitation is not visualized. No aortic stenosis is present. Aortic valve mean gradient measures 10.0 mmHg. Aortic valve peak gradient measures 18.5 mmHg. Aortic valve area, by VTI measures 2.53 cm. Pulmonic Valve: The pulmonic valve was not well visualized. Pulmonic valve regurgitation is not visualized. Aorta: The aortic root and ascending aorta are  structurally normal, with no evidence of dilitation. IAS/Shunts: The interatrial septum was not well visualized.  LEFT VENTRICLE PLAX 2D LVIDd:         4.00 cm     Diastology LVIDs:         1.80 cm     LV e' medial:    8.24 cm/s LV PW:         1.10 cm     LV E/e' medial:  7.5 LV IVS:        1.20 cm     LV e' lateral:   11.70 cm/s LVOT diam:     2.10 cm     LV E/e' lateral: 5.3 LV SV:         72 LV SV Index:   37 LVOT Area:     3.46 cm  LV Volumes (MOD) LV vol d, MOD A2C: 27.2 ml LV vol d, MOD A4C: 49.7 ml LV vol s, MOD A2C: 7.1 ml LV vol s, MOD A4C: 13.6 ml LV SV MOD A2C:     20.1 ml LV SV MOD A4C:     49.7 ml LV SV MOD BP:      26.5 ml RIGHT VENTRICLE TAPSE (M-mode): 2.0 cm LEFT ATRIUM             Index LA diam:        3.50 cm 1.79 cm/m LA Vol (A2C):   26.6 ml 13.57 ml/m LA Vol (A4C):   36.9 ml 18.82 ml/m LA Biplane Vol: 31.3 ml 15.97 ml/m  AORTIC VALVE AV Area (Vmax):    2.77 cm AV Area (Vmean):   2.66 cm AV Area (VTI):     2.53 cm AV Vmax:           215.00 cm/s AV Vmean:          146.000 cm/s AV VTI:            0.285 m AV Peak Grad:      18.5 mmHg AV Mean Grad:  10.0 mmHg LVOT Vmax:         172.00 cm/s LVOT Vmean:        112.000 cm/s LVOT VTI:          0.208 m LVOT/AV VTI ratio: 0.73  AORTA Ao Asc diam: 2.90 cm MITRAL VALVE MV Area (PHT): 4.49 cm    SHUNTS MV Decel Time: 169 msec    Systemic VTI:  0.21 m MV E velocity: 61.90 cm/s  Systemic Diam: 2.10 cm MV A velocity: 89.20 cm/s MV E/A ratio:  0.69 Riley Lam MD Electronically signed by Riley Lam MD Signature Date/Time: 07/29/2022/4:01:01 PM    Final    DG Chest Port 1 View  Result Date: 07/29/2022 CLINICAL DATA:  Cough.  Syncope. EXAM: PORTABLE CHEST 1 VIEW COMPARISON:  May 23, 2022.  Jul 07, 2022. FINDINGS: Prominence of the superior mediastinum is noted consistent with adenopathy as noted on prior CT scan. Stable cardiac size. Lungs are clear. Bony thorax is unremarkable. IMPRESSION: Widening of superior mediastinum  consistent with adenopathy noted on prior CT scan of Jul 07, 2022. This is concerning for lymphoma or metastatic disease. Electronically Signed   By: Lupita Raider M.D.   On: 07/29/2022 15:34   [place] CORE BIOPSY (LYMPH NODES)  Result Date: 07/16/2022 INDICATION: 72 year old male referred for biopsy of axillary lymph node EXAM: ULTRASOUND-GUIDED BIOPSY AXILLARY LYMPH NODE MEDICATIONS: None. ANESTHESIA/SEDATION: None COMPLICATIONS: None PROCEDURE: Informed written consent was obtained from the patient with interpreter after a thorough discussion of the procedural risks, benefits and alternatives. All questions were addressed. Maximal Sterile Barrier Technique was utilized including caps, mask, sterile gowns, sterile gloves, sterile drape, hand hygiene and skin antiseptic. A timeout was performed prior to the initiation of the procedure. Ultrasound survey was performed with images stored and sent to PACs. The left axillary region was prepped with chlorhexidine in a sterile fashion, and a sterile drape was applied covering the operative field. A sterile gown and sterile gloves were used for the procedure. Local anesthesia was provided with 1% Lidocaine. Ultrasound guidance was used to infiltrate the region with 1% lidocaine for local anesthesia. Four separate 16 gauge core needle biopsy were then acquired of the left axillary lymph node using ultrasound guidance. Images were stored. Final image was stored after biopsy. Patient tolerated the procedure well and remained hemodynamically stable throughout. No complications were encountered and no significant blood loss was encounter IMPRESSION: Status post ultrasound-guided biopsy of left axillary lymph node Signed, Yvone Neu. Miachel Roux, RPVI [facility] Electronically Signed   By: Gilmer Mor D.O.   On: 07/16/2022 17:07   CT ABDOMEN PELVIS W CONTRAST  Result Date: 07/15/2022 CLINICAL DATA:  Possible  lymphoma. Bowel obstruction suspected. Right lower quadrant and right upper quadrant abdominal pain for 6 weeks. Nausea and vomiting. Scheduled for a lymph node biopsy tomorrow. EXAM: CT ABDOMEN AND PELVIS WITH CONTRAST TECHNIQUE: Multidetector CT imaging of the abdomen and pelvis was performed using the standard protocol following bolus administration of intravenous contrast. RADIATION DOSE REDUCTION: This exam was performed according to the departmental dose-optimization program which includes automated exposure control, adjustment of the mA and/or kV according to patient size and/or use of iterative reconstruction technique. CONTRAST:  75mL OMNIPAQUE IOHEXOL 350 MG/ML SOLN COMPARISON:  CT abdomen and pelvis 06/29/2022 FINDINGS: Lower chest: Mild curvilinear subsegmental atelectasis and/or scarring within the bilateral lung bases is similar to prior. Heart size is at the upper limits of normal. Coronary artery  calcifications are noted. Hepatobiliary: Smooth liver contours. There are again punctate subcentimeter low-density lesions within the right hepatic lobe (axial series 3, image 25) and the left hepatic lobe (axial image 23) that are again too small to further characterize but not significantly changed from recent prior. The gallbladder is unremarkable. No intrahepatic or extrahepatic biliary ductal dilatation. Pancreas: Unremarkable. No pancreatic ductal dilatation or surrounding inflammatory changes. Spleen: The spleen measures 11 cm in length, within normal limits. There is a 2.9 x 2.6 x 2.6 cm low-density lesion within the medial aspect of the spleen that is unchanged from 07/07/2022 recent CT but new from 12/10/2021. Adrenals/Urinary Tract: Normal bilateral adrenals. The kidneys enhance uniformly and are symmetric in size. There are again bilateral fluid attenuation renal cysts, measuring up to 3.8 cm on the right and 2.8 cm on the left. No follow-up imaging is recommended. The urinary bladder is  decompressed, limiting evaluation. Stomach/Bowel: Moderate amount of stool throughout the colon. Normal appearance of the terminal ileum. Normal appendix (axial series 3, image 60) no dilated loops of bowel are seen to indicate bowel obstruction. Vascular/Lymphatic: No abdominal aortic aneurysm. The major intra-abdominal aortic branch vessels are patent. No significant change in mesenteric, porta hepatis, retroperitoneal, and pelvic lymphadenopathy compared to 07/07/2022, again new from 12/10/2021. Reference gastrohepatic ligament 2.0 cm short axis lymph node (axial series 2, image 21), lymph node at the anterior aspect of the splenic hilum measuring up to 2.0 cm in short axis (axial image 25), and conglomeration of aortocaval and para-aortic lymph nodes with a focal conglomeration measuring up to 2.8 cm in short axis (axial image 42), not significantly changed from 07/07/2022. Reproductive: The prostate again appears to be surgically absent. Other: No ventral abdominal hernia is seen. No free air or free fluid is seen within the abdomen or pelvis. Musculoskeletal: Chronic inferior L4 endplate mild concavity degenerative Schmorl's node is similar to prior. Anterior bridging osteophytes are again seen throughout the visualized inferior thoracic spine. There is complete anterior osseous fusion of the left sacroiliac joint. Moderate anterior right sacroiliac joint degenerative osteophytes. IMPRESSION: Compared to 07/07/2022: 1. No bowel obstruction.  Normal appendix. 2. No significant change in mesenteric, porta hepatis, retroperitoneal, and pelvic lymphadenopathy compared to 07/07/2022, again new from 12/10/2021. 3. No significant change in 2.9 cm low-density lesion within the medial aspect of the spleen compared to 07/07/2022 but new from 12/10/2021. 4. Constellation of findings are again concerning for lymphoma versus metastatic disease. Electronically Signed   By: Neita Garnet M.D.   On: 07/15/2022 11:59   CT  Chest Wo Contrast  Result Date: 07/07/2022 CLINICAL DATA:  Occult malignancy. EXAM: CT CHEST WITHOUT CONTRAST TECHNIQUE: Multidetector CT imaging of the chest was performed following the standard protocol without IV contrast. RADIATION DOSE REDUCTION: This exam was performed according to the departmental dose-optimization program which includes automated exposure control, adjustment of the mA and/or kV according to patient size and/or use of iterative reconstruction technique. COMPARISON:  12/15/2021. FINDINGS: Cardiovascular: The heart is normal in size and there is a trace pericardial effusion. A calcification is noted in the left anterior descending artery. The aorta and pulmonary trunk are normal in caliber. Mediastinum/Nodes: Multiple enlarged lymph nodes are present in the mediastinum measuring up to 1.9 cm in the prevascular space. Axillary lymphadenopathy is present bilaterally. Enlarged lymph nodes are seen in the suprasellar clavicular regions bilaterally. Evaluation of the hila is limited due to lack of IV contrast. The thyroid gland, trachea, and esophagus are within normal limits.  Lungs/Pleura: Atelectasis or scarring is present at the lung bases. No effusion or pneumothorax. Upper Abdomen: Multiple enlarged lymph nodes are present in the gastrohepatic ligament and porta hepatis and retroperitoneum, unchanged from the previous exam. Musculoskeletal: Mild degenerative changes are noted in the thoracic spine. No acute or suspicious osseous abnormality. IMPRESSION: 1. Multiple enlarged lymph nodes in the mediastinum, bilateral axilla, subclavian regions bilaterally, and upper abdomen, suspicious for lymphoma or metastatic disease. 2. Coronary artery calcification. Electronically Signed   By: Thornell Sartorius M.D.   On: 07/07/2022 03:55   CT ABDOMEN PELVIS W CONTRAST  Result Date: 07/07/2022 CLINICAL DATA:  Abdominal pain, acute, nonlocalized. EXAM: CT ABDOMEN AND PELVIS WITH CONTRAST TECHNIQUE:  Multidetector CT imaging of the abdomen and pelvis was performed using the standard protocol following bolus administration of intravenous contrast. RADIATION DOSE REDUCTION: This exam was performed according to the departmental dose-optimization program which includes automated exposure control, adjustment of the mA and/or kV according to patient size and/or use of iterative reconstruction technique. CONTRAST:  75mL OMNIPAQUE IOHEXOL 350 MG/ML SOLN COMPARISON:  12/10/2021. FINDINGS: Lower chest: Mild atelectasis or scarring is present at the lung bases. Hepatobiliary: Subcentimeter hypodensities are present in the liver which are too small to further characterize. No biliary ductal dilatation. The gallbladder is without stones. Pancreas: Unremarkable. No pancreatic ductal dilatation or surrounding inflammatory changes. Spleen: Normal size. There is a hypodensity in the spleen measuring 2.7 cm which is new from the previous exam. Adrenals/Urinary Tract: Adrenal glands are unremarkable. Cysts are noted within the kidneys bilaterally no renal calculus or hydronephrosis. No renal calculus or hydronephrosis. The bladder is unremarkable. Stomach/Bowel: Stomach is within normal limits. Appendix appears normal. No evidence of bowel wall thickening, distention, or inflammatory changes. No free air or pneumatosis. Vascular/Lymphatic: No significant vascular findings are present. Multiple enlarged lymph nodes are present in the gastrohepatic ligament, porta hepatis, splenic hilum, retroperitoneum, mesentery, and along the pelvic side walls bilaterally. The largest lymph node at the porta hepatis measures up to 3.2 cm in short axis diameter. Reproductive: Status post prostatectomy. Other: No abdominopelvic ascites. Musculoskeletal: Degenerative changes are present in the thoracolumbar spine. No acute or suspicious osseous abnormality. IMPRESSION: 1. No acute intra-abdominal process. 2. Multiple enlarged lymph nodes in the  gastrohepatic ligament, porta hepatis, retroperitoneum, mesentery, and splenic hilum, and pelvic sidewall. A new hypodense lesion is present in the spleen measuring 2.7 cm. Differential diagnosis includes lymphoma or metastatic disease. Electronically Signed   By: Thornell Sartorius M.D.   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  # 7 Anemia and mild thrombocytopenia -- likely multifactorial -- lymphoma + MDS+ >?sepsis r/p GI bleeding. .CBC    Component Value Date/Time   WBC 10.1 08/02/2022 0004   RBC 2.76 (L) 08/02/2022 0004  HGB 7.4 (L) 08/02/2022 0004   HGB 8.1 (L) 07/19/2022 1432   HCT 23.4 (L) 08/02/2022 0004   PLT 92 (L) 08/02/2022 0004   PLT 226 07/19/2022 1432   MCV 84.8 08/02/2022 0004   MCH 26.8 08/02/2022 0004   MCHC 31.6 08/02/2022 0004   RDW 20.4 (H) 08/02/2022 0004   LYMPHSABS 1.7 07/29/2022 1520   MONOABS 1.6 (H) 07/29/2022 1520   EOSABS 0.0 07/29/2022 1520   BASOSABS 0.0 07/29/2022 1520   - transfuse prn to maintain hgb>=8 - rx lymphoma -r/o GI or other bleeding CT ABd - no significant lymphoma changes no fluid collections.  I shall see the patient on Monday will plan to pursue R-mini CHOP. Plz call on call oncologist over the weekend if any new questions.  .The total time spent  in the appointment was 60 minutes* .  All of the patient's questions were answered with apparent satisfaction. The patient knows to call the clinic with any problems, questions or concerns.   Wyvonnia Lora MD MS AAHIVMS [facility] Hematology/Oncology Physician [facility]  .*Total Encounter Time as defined by the Centers for Medicare and Medicaid Services includes, in addition to the face-to-face time of a patient visit (documented in the note above) non-face-to-face time: obtaining and reviewing outside history, ordering and reviewing medications, tests or procedures, care coordination (communications with other health care professionals or caregivers) and documentation in the medical record.

## 2022-08-03 ENCOUNTER — Inpatient Hospital Stay (HOSPITAL_COMMUNITY): Payer: 59

## 2022-08-03 DIAGNOSIS — C8338 Diffuse large B-cell lymphoma, lymph nodes of multiple sites: Secondary | ICD-10-CM | POA: Diagnosis not present

## 2022-08-03 DIAGNOSIS — E871 Hypo-osmolality and hyponatremia: Secondary | ICD-10-CM | POA: Diagnosis not present

## 2022-08-03 DIAGNOSIS — I4891 Unspecified atrial fibrillation: Secondary | ICD-10-CM | POA: Diagnosis not present

## 2022-08-03 DIAGNOSIS — I10 Essential (primary) hypertension: Secondary | ICD-10-CM | POA: Diagnosis not present

## 2022-08-03 LAB — CBC
HCT: 20.7 % — ABNORMAL LOW (ref 39.0–52.0)
Hemoglobin: 6.6 g/dL — CL (ref 13.0–17.0)
MCH: 26.7 pg (ref 26.0–34.0)
MCHC: 31.9 g/dL (ref 30.0–36.0)
MCV: 83.8 fL (ref 80.0–100.0)
Platelets: 95 10*3/uL — ABNORMAL LOW (ref 150–400)
RBC: 2.47 MIL/uL — ABNORMAL LOW (ref 4.22–5.81)
RDW: 20.3 % — ABNORMAL HIGH (ref 11.5–15.5)
WBC: 9 10*3/uL (ref 4.0–10.5)
nRBC: 0.7 % — ABNORMAL HIGH (ref 0.0–0.2)

## 2022-08-03 LAB — BASIC METABOLIC PANEL
Anion gap: 11 (ref 5–15)
BUN: 31 mg/dL — ABNORMAL HIGH (ref 8–23)
CO2: 19 mmol/L — ABNORMAL LOW (ref 22–32)
Calcium: 8.1 mg/dL — ABNORMAL LOW (ref 8.9–10.3)
Chloride: 96 mmol/L — ABNORMAL LOW (ref 98–111)
Creatinine, Ser: 0.97 mg/dL (ref 0.61–1.24)
GFR, Estimated: >60 mL/min (ref >60)
Glucose, Bld: 111 mg/dL — ABNORMAL HIGH (ref 70–99)
Potassium: 4.1 mmol/L (ref 3.5–5.1)
Sodium: 126 mmol/L — ABNORMAL LOW (ref 135–145)

## 2022-08-03 LAB — TYPE AND SCREEN: Unit division: 0

## 2022-08-03 LAB — BPAM RBC

## 2022-08-03 LAB — HEMOGLOBIN AND HEMATOCRIT, BLOOD
HCT: 27.7 % — ABNORMAL LOW (ref 39.0–52.0)
Hemoglobin: 8.7 g/dL — ABNORMAL LOW (ref 13.0–17.0)

## 2022-08-03 LAB — CULTURE, BLOOD (ROUTINE X 2)

## 2022-08-03 LAB — PREPARE RBC (CROSSMATCH)

## 2022-08-03 MED ORDER — AMIODARONE HCL 200 MG PO TABS: 200.0000 mg | ORAL_TABLET | Freq: Every day | ORAL | Status: DC

## 2022-08-03 MED ORDER — AMIODARONE HCL 200 MG PO TABS
400.0000 mg | ORAL_TABLET | Freq: Two times a day (BID) | ORAL | Status: DC
  Administered 2022-08-03 – 2022-08-05 (×5): 400 mg via ORAL
  Filled 2022-08-03 (×5): qty 2

## 2022-08-03 MED ORDER — SODIUM CHLORIDE 0.9% IV SOLUTION: Freq: Once | INTRAVENOUS | Status: DC

## 2022-08-03 MED ORDER — IOHEXOL 300 MG/ML  SOLN
75.0000 mL | Freq: Once | INTRAMUSCULAR | Status: AC | PRN
  Administered 2022-08-03: 75 mL via INTRAVENOUS

## 2022-08-03 NOTE — Progress Notes (Signed)
Cardiology Progress Note  Patient ID: Tyler [name] MRN: [id number] DOB: 01/13/1951 Date of Encounter: 08/03/2022  Primary Cardiologist: None  Subjective   Chief Complaint: None.   HPI: A-fib yesterday.  Remains on IV amiodarone.  Discussion about advancing diet today.  No signs of heart failure.  ROS:  All other ROS reviewed and negative. Pertinent positives noted in the HPI.     Inpatient Medications  Scheduled Meds:  Chlorhexidine Gluconate Cloth  6 each Topical Daily   enoxaparin (LOVENOX) injection  40 mg Subcutaneous Q24H   feeding supplement (KATE FARMS STANDARD 1.4)  325 mL Oral BID BM   metoprolol tartrate  25 mg Oral BID   multivitamin with minerals  1 tablet Oral Daily   polyethylene glycol  17 g Oral Daily   QUEtiapine  25 mg Oral QHS   senna  1 tablet Oral Daily   Continuous Infusions:  amiodarone 30 mg/hr (08/02/22 1653)   ceFEPime (MAXIPIME) IV Stopped (08/03/22 0041)   PRN Meds: acetaminophen, docusate sodium, lip balm, metoprolol tartrate, morphine injection, ondansetron **OR** ondansetron (ZOFRAN) IV, mouth rinse, sodium chloride   Vital Signs   Vitals:   08/03/22 0300 08/03/22 0400 08/03/22 0500 08/03/22 0600  BP: 117/62 116/61 (!) 121/48 (!) 128/49  Pulse: 86 89 88 88  Resp: (!) 24 (!) 23 (!) 23 (!) 23  Temp:  98.2 F (36.8 C)    TempSrc:  Oral    SpO2: 100% 98% 98% 100%  Weight:      Height:        Intake/Output Summary (Last 24 hours) at 08/03/2022 0746 Last data filed at 08/03/2022 0041 Gross per 24 hour  Intake 426.24 ml  Output 429 ml  Net -2.76 ml      08/01/2022    1:08 AM 07/30/2022   12:00 AM 07/29/2022    3:02 PM  Last 3 Weights  Weight (lbs) 195 lb 15.8 oz 194 lb 0.1 oz 185 lb  Weight (kg) 88.9 kg 88 kg 83.915 kg      Telemetry  Overnight telemetry shows sinus rhythm 90s, which I personally reviewed.   Physical Exam   Vitals:   08/03/22 0300 08/03/22 0400 08/03/22 0500 08/03/22 0600  BP: 117/62 116/61 (!) 121/48 (!) 128/49   Pulse: 86 89 88 88  Resp: (!) 24 (!) 23 (!) 23 (!) 23  Temp:  98.2 F (36.8 C)    TempSrc:  Oral    SpO2: 100% 98% 98% 100%  Weight:      Height:        Intake/Output Summary (Last 24 hours) at 08/03/2022 0746 Last data filed at 08/03/2022 0041 Gross per 24 hour  Intake 426.24 ml  Output 429 ml  Net -2.76 ml       08/01/2022    1:08 AM 07/30/2022   12:00 AM 07/29/2022    3:02 PM  Last 3 Weights  Weight (lbs) 195 lb 15.8 oz 194 lb 0.1 oz 185 lb  Weight (kg) 88.9 kg 88 kg 83.915 kg    Body mass index is 30.7 kg/m.  General: Well nourished, well developed, in no acute distress Head: Atraumatic, normal size  Eyes: PEERLA, EOMI  Neck: Supple, no JVD Endocrine: No thryomegaly Cardiac: Normal S1, S2; RRR; no murmurs, rubs, or gallops Lungs: Clear to auscultation bilaterally, no wheezing, rhonchi or rales  Abd: Soft, nontender, no hepatomegaly  Ext: No edema, pulses 2+ Musculoskeletal: No deformities, BUE and BLE strength normal and equal Skin: Warm and dry,  no rashes   Neuro: Alert, awake, following commands  Labs  High Sensitivity Troponin:   Recent Labs  Lab 07/31/22 2354 08/01/22 0251  TROPONINIHS 12 14     Cardiac EnzymesNo results for input(s): "TROPONINI" in the last 168 hours. No results for input(s): "TROPIPOC" in the last 168 hours.  Chemistry Recent Labs  Lab 07/30/22 0410 07/31/22 0405 08/01/22 0251 08/02/22 0004  NA 127* 128* 124* 125*  K 4.2 4.0 3.6 4.0  CL 97* 96* 95* 97*  CO2 20* 21* 20* 19*  GLUCOSE 106* 82 112* 117*  BUN 21 16 22  24*  CREATININE 0.77 0.70 0.84 0.82  CALCIUM 8.0* 7.9* 7.6* 7.5*  PROT 5.1* 4.6* 4.7*  --   ALBUMIN 2.3* 2.0* 2.0*  --   AST 39 38 45*  --   ALT 34 29 34  --   ALKPHOS 89 72 74  --   BILITOT 1.8* 2.2* 2.0*  --   GFRNONAA >60 >60 >60 >60  ANIONGAP 10 11 9 9     Hematology Recent Labs  Lab 07/31/22 0405 08/01/22 0251 08/02/22 0004  WBC 12.2* 10.2 10.1  RBC 2.61* 2.96* 2.76*  HGB 6.8* 8.0* 7.4*  HCT 21.9* 24.7*  23.4*  MCV 83.9 83.4 84.8  MCH 26.1 27.0 26.8  MCHC 31.1 32.4 31.6  RDW 21.5* 20.3* 20.4*  PLT 91* 87* 92*   BNP Recent Labs  Lab 08/02/22 1249  BNP 55.5    DDimer No results for input(s): "DDIMER" in the last 168 hours.   Radiology  DG Chest Port 1V same Day  Result Date: 08/02/2022 CLINICAL DATA:  Dyspnea shortness of breath EXAM: PORTABLE CHEST 1 VIEW COMPARISON:  Multiple exams, including 07/31/2022 FINDINGS: Substantially widened mediastinum at 14.9 cm presumably due to adenopathy given findings on prior imaging workups. This is stable compared to 07/31/2022 and substantially increased from 05/23/2022, and questionably increased from 07/06/2022 (AP projection on today's exam may be magnify the mediastinum more than prior PA projections and CT imaging). Heart size within normal limits. Hazy indistinct density at the left lung base possibly from atelectasis or pneumonia, minimally improved from previous. IMPRESSION: 1. Substantially widened mediastinum at 14.9 cm, presumably due to adenopathy given previous chest CT findings. 2. Hazy indistinct density at the left lung base possibly from atelectasis or pneumonia, minimally improved from previous. Electronically Signed   By: Gaylyn Rong M.D.   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 intake, tachycardia and episode of shaking and unresponsiveness then confused thereafter. EEG to evaluate for seizure Level of alertness: Awake AEDs during EEG study: None Technical aspects: This EEG study was done with scalp electrodes positioned according to the 10-20 International system of electrode  placement. Electrical activity was reviewed with band pass filter of 1-70Hz , sensitivity of 7 uV/mm, display speed of 20mm/sec with a 60Hz  notched filter applied as appropriate. EEG data were recorded continuously and digitally stored.  Video monitoring was available and reviewed as appropriate. Description: The posterior dominant rhythm consists of 7.5 Hz activity of moderate voltage (25-35 uV) seen predominantly in posterior head regions, symmetric and reactive to eye opening and eye closing. EEG showed  intermittent generalized sharply contoured high amplitude 2-3hz  delta slowing. Hyperventilation and photic stimulation were not performed.   ABNORMALITY - Intermittent slow, generalized IMPRESSION: This study is suggestive of mild diffuse encephalopathy, nonspecific etiology. No seizures or epileptiform discharges were seen throughout the recording.  If suspicion for interictal activity remains a concern, a prolonged study can be considered. Charlsie Quest   DG Abd 1 View  Result Date: 08/01/2022 CLINICAL DATA:  Abdominal distension. EXAM: ABDOMEN - 1 VIEW COMPARISON:  CT AP 07/29/2022 FINDINGS: No dilated loops of large or small bowel identified. Stool noted within the colon. No abnormal abdominal or pelvic calcifications. Visualized osseous structures appear intact. IMPRESSION: Nonobstructive bowel gas pattern. Electronically Signed   By: Signa Kell M.D.   On: 08/01/2022 12:41    Cardiac Studies  TTE 07/29/2022  1. Intracavitary gradient is realted to dynamic function. Left  ventricular ejection fraction, by estimation, is >75%. The left ventricle  has hyperdynamic function. The left ventricle has no regional wall motion  abnormalities. There is mild concentric  left ventricular hypertrophy. Left ventricular diastolic parameters were  normal.   2. Right ventricular systolic function is hyperdynamic. The right  ventricular size is normal. Tricuspid regurgitation signal is inadequate  for  assessing PA pressure.   3. The mitral valve is normal in structure. No evidence of mitral valve  regurgitation.   4. The aortic valve is tricuspid. There is mild calcification of the  aortic valve. Aortic valve regurgitation is not visualized. No aortic  stenosis is present.   Patient Profile  Tyler [name] is a 72 y.o. male with lymphoma, pulmonary embolism, prostate cancer status post prostatectomy, paroxysmal atrial fibrillation admitted on 07/29/2022 with sepsis and anemia.  Currently undergoing treatment for lymphoma.  Cardiology consulted for A-fib with RVR.  Assessment & Plan   # Paroxysmal A-fib -Admitted with sepsis and has known lymphoma.  Currently on chemotherapy.  Workup for sepsis has been unremarkable.  Remains on antibiotics. -His A-fib is rather rapid.  Agree with rhythm control strategy.  Transition to oral amiodarone today.  400 mg twice daily for 7 days followed by 200 mg daily. -Continue metoprolol tartrate 25 mg twice daily. -Not a candidate for anticoagulation given anemia and thrombocytopenia. -I do not believe his A-fib is contributing to his overall picture of lactic acidosis.  I believe this is secondary. -Echocardiogram shows normal LV function with intracavitary gradient which is likely related to volume depletion.  No murmurs on exam.  No signs of heart failure.  I do not suspect hypertrophic cardiomyopathy.  [facility] will sign off.   Medication Recommendations: Amiodarone as above Other recommendations (labs, testing, etc): None Follow up as an outpatient: We will arrange outpatient follow-up in 2 to 4 weeks.  Please notify cardiology of any rapid atrial fibrillation that recurs.  For questions or updates, please contact [facility] Please consult [email or web address] for contact info under        Signed, Gerri Spore T. Flora Lipps, MD, [facility] [place]  [facility]  08/03/2022 7:46 AM

## 2022-08-03 NOTE — Progress Notes (Signed)
PT Cancellation Note  Patient Details Name: Tyler [name] MRN: [id number] DOB: Apr 18, 1950   Cancelled Treatment:    Reason Eval/Treat Not Completed: Medical issues which prohibited therapy; Hgb 6.6, BP soft, RR elevated at rest. Defer PT at this time   [facility] 08/03/2022, 3:07 PM

## 2022-08-03 NOTE — Progress Notes (Signed)
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 intake, tachycardia and episode of shaking and unresponsiveness then confused thereafter.  He was hypotensive, tachycardic and had lactic acidosis.  Temperature 100.7.  Blood cultures were obtained and patient started on IV antibiotics for sepsis of unclear origin.    Assessment/Plan:   Lactic acidosis/sepsis -Unclear source, started on vancomycin and cefepime -Blood culture showed no growth till date -Received IV fluids, blood pressure is stable - vancomycin was discontinued, continue cefepime  Paroxysmal atrial fibrillation - transferred to SDU  -started on amiodarone gtt -Not a candidate for anticoagulation; he has a history of PE and was on Eliquis which was stopped in 2023 after he developed hematuria.  Also has history of thrombocytopenia with normocytic anemia -He is now back to normal sinus rhythm. -Cardiology consulted; have signed off,  Dyspnea -Chest x-ray showed widened mediastinum, discussed with pulmonology. -Repeat CT chest with contrast obtained shows significant progression of bulky bilateral axillary, bilateral hilar, bilateral mediastinal, bilateral lower leg and upper abdominal lymphadenopathy.  Multiple new splenic masses.  Finding compatible with aggressive lymphoma  Seizure-like activity -Reported prior to admission with short postictal. -MRI brain showed multifocal hyperintense T2 weighted signal within the white matter, most consistent with chronic small vessel ischemia -EEG obtained shows mild diffuse encephalopathy, no seizures or epileptiform discharges  Right facial paresthesia -CT maxillofacial showed large periapical lucency at the root of the most posterior remaining right maxillary tooth.  No abscess -Right  anterior ethmoid and maxillary sinus complete opacification with polypoid continues, possibly antrochoanal polyp -Multiple enlarged submandibular lymph nodes measuring up to 1.6 cm, likely reactive  Acute kidney injury -Secondary to dehydration, resolved  Chronic hyponatremia -Sodium has been low for past 1 month -Sodium is low at 125 -Serum osmolality is 271, urine osmolality 702  Constipation -Had BM yesterday with suppository -Abdomen x-ray obtained this morning showed nonobstructive bowel gas pattern  Normocytic anemia -Likely in setting of lymphoma -S/p 3 units PRBC with hemoglobin is 6.6 today -Check stool for FOBT -Will transfuse 1 unit PRBC   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 for chemotherapy initiation after port insertion -Due to disease burden he was placed on prednisone 60 mg daily for 10 days, last dose was on the day of admission -Oncology saw patient yesterday, plan for R-miniCHOP 1st cycle as inpatient   Thrombocytopenia -Platelet count is stable  Insomnia, anxiety, nightmares -Continue Seroquel nightly  History of prostate cancer -S/p prostatectomy  L4 compression fracture -Subacute appearing -PT/OT     Medications     sodium chloride   Intravenous Once   amiodarone  400 mg Oral BID   Followed by   Melene Muller ON 08/10/2022] amiodarone  200 mg Oral Daily   Chlorhexidine Gluconate Cloth  6 each Topical Daily   enoxaparin (LOVENOX) injection  40 mg Subcutaneous Q24H   feeding supplement (KATE FARMS STANDARD 1.4)  325 mL Oral BID BM   metoprolol tartrate  25 mg Oral BID   multivitamin with minerals  1 tablet Oral Daily   polyethylene glycol  17 g Oral Daily   QUEtiapine  25 mg Oral QHS   senna  1 tablet Oral Daily     Data Reviewed:   CBG:  Recent Labs  Lab 07/29/22 1516 08/01/22 0010  GLUCAP 93 110*  SpO2: 100 % O2 Flow Rate (L/min): 2 L/min    Vitals:   08/03/22 0900 08/03/22 0945 08/03/22 1130 08/03/22 1229  BP:  (!) 98/58 (!) 127/52    Pulse: 89 88  73  Resp: (!) 24   (!) 23  Temp:   97.7 F (36.5 C)   TempSrc:   Oral   SpO2: 100%   100%  Weight:      Height:          Data Reviewed:  Basic Metabolic Panel: Recent Labs  Lab 07/30/22 0410 07/31/22 0405 07/31/22 2254 08/01/22 0251 08/02/22 0004 08/03/22 0848  NA 127* 128*  --  124* 125* 126*  K 4.2 4.0  --  3.6 4.0 4.1  CL 97* 96*  --  95* 97* 96*  CO2 20* 21*  --  20* 19* 19*  GLUCOSE 106* 82  --  112* 117* 111*  BUN 21 16  --  22 24* 31*  CREATININE 0.77 0.70  --  0.84 0.82 0.97  CALCIUM 8.0* 7.9*  --  7.6* 7.5* 8.1*  MG  --   --  1.8  --  1.7  --   PHOS  --   --  2.8  --  2.8  --     CBC: Recent Labs  Lab 07/29/22 1520 07/30/22 0410 07/31/22 0405 08/01/22 0251 08/02/22 0004 08/03/22 0848  WBC 13.8* 16.0* 12.2* 10.2 10.1 9.0  NEUTROABS 10.3*  --   --   --   --   --   HGB 6.4* 8.1* 6.8* 8.0* 7.4* 6.6*  HCT 20.1* 24.5* 21.9* 24.7* 23.4* 20.7*  MCV 80.1 80.6 83.9 83.4 84.8 83.8  PLT 109* 110* 91* 87* 92* 95*    LFT Recent Labs  Lab 07/29/22 1520 07/30/22 0410 07/31/22 0405 08/01/22 0251  AST 33 39 38 45*  ALT 40 34 29 34  ALKPHOS 89 89 72 74  BILITOT 2.0* 1.8* 2.2* 2.0*  PROT 5.4* 5.1* 4.6* 4.7*  ALBUMIN 2.3* 2.3* 2.0* 2.0*     Antibiotics: Anti-infectives (From admission, onward)    Start     Dose/Rate Route Frequency Ordered Stop   07/30/22 1600  vancomycin (VANCOREADY) IVPB 1750 mg/350 mL  Status:  Discontinued        1,750 mg 175 mL/hr over 120 Minutes Intravenous Every 24 hours 07/29/22 2108 08/01/22 1135   07/30/22 0000  ceFEPIme (MAXIPIME) 2 g in sodium chloride 0.9 % 100 mL IVPB        2 g 200 mL/hr over 30 Minutes Intravenous Every 8 hours 07/29/22 2108     07/29/22 1530  ceFEPIme (MAXIPIME) 2 g in sodium chloride 0.9 % 100 mL IVPB        2 g 200 mL/hr over 30 Minutes Intravenous  Once 07/29/22 1517 07/29/22 1628   07/29/22 1530  metroNIDAZOLE (FLAGYL) IVPB 500 mg        500 mg 100 mL/hr  over 60 Minutes Intravenous  Once 07/29/22 1517 07/29/22 1701   07/29/22 1530  vancomycin (VANCOCIN) IVPB 1000 mg/200 mL premix  Status:  Discontinued        1,000 mg 200 mL/hr over 60 Minutes Intravenous  Once 07/29/22 1517 07/29/22 1520   07/29/22 1530  vancomycin (VANCOREADY) IVPB 1750 mg/350 mL        1,750 mg 175 mL/hr over 120 Minutes Intravenous  Once 07/29/22 1520 07/29/22 1908        DVT prophylaxis: Lovenox  Code Status: Full code  Family Communication: Discussed with patient's daughter at bedside   CONSULTS    Subjective   Patient has been wheezing this morning.  The O2 sats have been 100% on room air.   Physical Examination:  Appears in no acute distress Mild tachypnea, bilateral wheezing auscultated Neck-significant lymphadenopathy noted in the region of neck, submandibular region Abdomen is soft, nontender, no organomegaly No edema in the lower extremities   Status is: Inpatient:          Meredeth Ide   [facility] If 7PM-7AM, please contact night-coverage at [email or web address], Office  [phone]   08/03/2022, 12:32 PM  LOS: 5 days

## 2022-08-03 NOTE — Evaluation (Addendum)
Clinical/Bedside Swallow Evaluation Patient Details  Name: Tyler [name] MRN: [id number] Date of Birth: 05-Jan-1951  Today's Date: 08/03/2022 Time: SLP Start Time (ACUTE ONLY): 1618 SLP Stop Time (ACUTE ONLY): 1645 SLP Time Calculation (min) (ACUTE ONLY): 27 min  Past Medical History:  Past Medical History:  Diagnosis Date   Cancer (HCC)    prostate   Hypertension    Past Surgical History:  Past Surgical History:  Procedure Laterality Date   LYMPHADENECTOMY Bilateral 09/01/2019   Procedure: LYMPHADENECTOMY;  Surgeon: Sebastian Ache, MD;  Location: [facility];  Service: Urology;  Laterality: Bilateral;   ROBOT ASSISTED LAPAROSCOPIC RADICAL PROSTATECTOMY N/A 09/01/2019   Procedure: XI ROBOTIC ASSISTED LAPAROSCOPIC RADICAL PROSTATECTOMY;  Surgeon: Sebastian Ache, MD;  Location: [facility];  Service: Urology;  Laterality: N/A;  3 HRS   HPI:  Per MD note "72 yo male adm to [facility] on 07/28/2022 with poor p.o. intake, tachycardia and episode of shaking and unresponsiveness then confused thereafter.  He was hypotensive, tachycardic and had lactic acidosis.  Temperature 100.7.  Pt was transferred to SD due to afib.   MRI brain showed multifocal hyperintense T2 weighted signal within the white matter, most consistent with chronic small vessel ischemia.  CT chest " Rapid interval progression of bulky bilateral axillary, bilateral  hilar, bilateral mediastinal, bilateral lower neck and upper  abdominal lymphadenopathy since recent 07/07/2022 chest CT. Multiple  new splenic masses. Enlarging upper omental soft tissue nodules.Small dependent bilateral pleural effusions with associated  compressive mild-to-moderate posterior lower lobe atelectasis, new."    Pt has Right facial paresthesia and is found to have multiple large submandibular lymph nodes, likely reactive.  Swallow eval ordered, pt was on a diet but it was discontinued this am.    Assessment / Plan / Recommendation  Clinical Impression  Patient presents  with clinical indications of minimal oral dysphagia with xerostomia and weakness contributing.  Facial paresthesia is right sided and lateral to nose only - not upper or lower.   No indication of aspiration nor focal CN deficit apparent with po trials and pt able to self feed.  He endorses difficulty with masticating beef, chicken and hard brocolli but able to manage fish.  Daughter reports pt with some dysarthria as of late.    SLP questions component of early satiety as well as discomfort/decreased clearance and impaired breathing due to distended abdomen/lymphadenopathy causing compression.    Did not conduct 3 ounce [facility] swallow screen due to pt easily becoming dyspenic with RR to low 20s and having occasional wheeze with effort.    Recommend he return to regular/thin diet to allow family to order foods he may be able to manage easily - start intake with liquids, order extra gravy/sauces with meals to help with dysphagia. Place pt in reverse trendelenberg for maximal safety due to discomfort with HOB upright.   Will follow up x1 to assure pt tolerating po. Advised pt's daughter that she can bring in foods from home that pt would prefer.  Pt and daughter agreeable to plan and report appreciation for evaluation/recommendations. SLP Visit Diagnosis: Dysphagia, oral phase (R13.11)    Aspiration Risk  Mild aspiration risk    Diet Recommendation Regular;Thin liquid (SOFT)   Liquid Administration via: Straw Medication Administration:  (as tolerated) Supervision: Patient able to self feed Compensations: Slow rate;Small sips/bites;Other (Comment) (reverse trendelenberg) Postural Changes: Seated upright at 90 degrees;Remain upright for at least 30 minutes after po intake    Other  Recommendations Oral Care Recommendations: Oral care BID  Recommendations for follow up therapy are one component of a multi-disciplinary discharge planning process, led by the attending physician.  Recommendations may  be updated based on patient status, additional functional criteria and insurance authorization.  Follow up Recommendations No SLP follow up      Assistance Recommended at Discharge    Functional Status Assessment Patient has had a recent decline in their functional status and demonstrates the ability to make significant improvements in function in a reasonable and predictable amount of time.  Frequency and Duration min 1 x/week  1 week       Prognosis Prognosis for improved oropharyngeal function: Good      Swallow Study   General Date of Onset: 08/03/22 HPI: Per MD note "72 yo male adm to [facility] on 07/28/2022 with poor p.o. intake, tachycardia and episode of shaking and unresponsiveness then confused thereafter.  He was hypotensive, tachycardic and had lactic acidosis.  Temperature 100.7.  Pt was transferred to SD due to afib.   MRI brain showed multifocal hyperintense T2 weighted signal within the white matter, most consistent with chronic small vessel ischemia.  CT chest " Rapid interval progression of bulky bilateral axillary, bilateral  hilar, bilateral mediastinal, bilateral lower neck and upper  abdominal lymphadenopathy since recent 07/07/2022 chest CT. Multiple  new splenic masses. Enlarging upper omental soft tissue nodules.Small dependent bilateral pleural effusions with associated  compressive mild-to-moderate posterior lower lobe atelectasis, new."    Pt has Right facial paresthesia and is found to have multiple large submandibular lymph nodes, likely reactive.  Swallow eval ordered, pt was on a diet but it was discontinued this am. Type of Study: Bedside Swallow Evaluation Previous Swallow Assessment: none in the system Diet Prior to this Study: NPO Temperature Spikes Noted: No Respiratory Status: Room air History of Recent Intubation: No Behavior/Cognition: Alert Oral Cavity Assessment: Within Functional Limits Oral Care Completed by SLP: No Oral Cavity - Dentition: Adequate  natural dentition Vision: Functional for self-feeding Self-Feeding Abilities: Able to feed self Patient Positioning: Other (comment) (reverse trendelenberg) Baseline Vocal Quality: Low vocal intensity Volitional Cough: Weak Volitional Swallow: Able to elicit (after oral moisture)    Oral/Motor/Sensory Function Overall Oral Motor/Sensory Function: Within functional limits   Ice Chips Ice chips: Not tested   Thin Liquid Thin Liquid: Within functional limits Presentation: Straw    Nectar Thick Nectar Thick Liquid: Not tested   Honey Thick Honey Thick Liquid: Not tested   Puree Puree: Within functional limits Presentation: Self Fed;Spoon   Solid     Solid: Impaired (WFL, prolonged mastication of solids - used applesauce to aid transiting) Presentation: Self Fed Oral Phase Functional Implications: Prolonged oral transit      Chales Abrahams 08/03/2022,5:30 PM  [name], MS [facility] SLP Acute Rehab Services Office [phone]

## 2022-08-04 DIAGNOSIS — I4891 Unspecified atrial fibrillation: Secondary | ICD-10-CM | POA: Diagnosis not present

## 2022-08-04 DIAGNOSIS — D649 Anemia, unspecified: Secondary | ICD-10-CM | POA: Diagnosis not present

## 2022-08-04 DIAGNOSIS — I10 Essential (primary) hypertension: Secondary | ICD-10-CM | POA: Diagnosis not present

## 2022-08-04 DIAGNOSIS — C8338 Diffuse large B-cell lymphoma, lymph nodes of multiple sites: Secondary | ICD-10-CM | POA: Diagnosis not present

## 2022-08-04 LAB — CULTURE, BLOOD (ROUTINE X 2): Culture: NO GROWTH

## 2022-08-04 MED ORDER — FENTANYL 50 MCG/HR TD PT72
1.0000 | MEDICATED_PATCH | TRANSDERMAL | Status: DC
  Administered 2022-08-04 – 2022-08-07 (×2): 1 via TRANSDERMAL
  Filled 2022-08-04 (×2): qty 1

## 2022-08-04 NOTE — Progress Notes (Signed)
Pharmacy Antibiotic Note  Tyler [name] is a 72 y.o. male admitted on 07/29/2022 with sepsis.  Pharmacy has been consulted for Cefepime dosing.  Plan: Continue Cefepime 2g IV q8h - 7 day stop entered per discussion with MD   Height: 5\' 7"  (170.2 cm) Weight: 88.9 kg (195 lb 15.8 oz) IBW/kg (Calculated) : 66.1  Temp (24hrs), Avg:98.3 F (36.8 C), Min:97.7 F (36.5 C), Max:99.3 F (37.4 C)  Recent Labs  Lab 07/29/22 1520 07/29/22 1720 07/29/22 2010 07/30/22 0410 07/31/22 0405 07/31/22 2254 08/01/22 0251 08/01/22 0620 08/02/22 0004 08/03/22 0848  WBC 13.8*  --   --  16.0* 12.2*  --  10.2  --  10.1 9.0  CREATININE 1.01  --   --  0.77 0.70  --  0.84  --  0.82 0.97  LATICACIDVEN 5.1* 5.0* 3.7*  --   --  2.6*  --  2.9*  --   --      Estimated Creatinine Clearance: 75.4 mL/min (by C-G formula based on SCr of 0.97 mg/dL).    Allergies  Allergen Reactions   Amitriptyline Other (See Comments)    Nightmares, Night sweats, Migraines    Amlodipine Swelling    BILATERAL FEET TO ANKLES   Neurontin [Gabapentin] Other (See Comments)    Nightmares; Night sweats; Headache    Antimicrobials this admission: Vanco 6/3 >> 6/6 Cefepime 6/3 >> 6/10   Microbiology results: 6/3: BC x 2: ngtd 6/3: COVID/Flu/RSV: neg 6/5 BCx: ngtd  Thank you for allowing pharmacy to be a part of this patient's care.  Pricilla Riffle, PharmD, BCPS Clinical Pharmacist 08/04/2022 9:11 AM

## 2022-08-04 NOTE — Progress Notes (Signed)
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 intake, tachycardia and episode of shaking and unresponsiveness then confused thereafter.  He was hypotensive, tachycardic and had lactic acidosis.  Temperature 100.7.  Blood cultures were obtained and patient started on IV antibiotics for sepsis of unclear origin.    Assessment/Plan:   Lactic acidosis/sepsis -Unclear source, started on vancomycin and cefepime -Blood culture showed no growth till date -Received IV fluids, blood pressure is stable - vancomycin was discontinued, continue cefepime  Paroxysmal atrial fibrillation - transferred to SDU  -started on amiodarone gtt -Not a candidate for anticoagulation; he has a history of PE and was on Eliquis which was stopped in 2023 after he developed hematuria.  Also has history of thrombocytopenia with normocytic anemia -He is now back to normal sinus rhythm. -Cardiology consulted; have signed off,  Dyspnea -Chest x-ray showed widened mediastinum, discussed with pulmonology. -Repeat CT chest with contrast obtained shows significant progression of bulky bilateral axillary, bilateral hilar, bilateral mediastinal, bilateral lower leg and upper abdominal lymphadenopathy.  Multiple new splenic masses.  Finding compatible with aggressive lymphoma  Seizure-like activity -Reported prior to admission with short postictal. -MRI brain showed multifocal hyperintense T2 weighted signal within the white matter, most consistent with chronic small vessel ischemia -EEG obtained shows mild diffuse encephalopathy, no seizures or epileptiform discharges  Right facial paresthesia -CT maxillofacial showed large periapical lucency at the root of the most posterior remaining right maxillary tooth.  No abscess -Right  anterior ethmoid and maxillary sinus complete opacification with polypoid continues, possibly antrochoanal polyp -Multiple enlarged submandibular lymph nodes measuring up to 1.6 cm, likely reactive  Acute kidney injury -Secondary to dehydration, resolved  Chronic hyponatremia -Sodium has been low for past 1 month -Sodium is low at 125 -Serum osmolality is 271, urine osmolality 702  Constipation -Had BM yesterday with suppository -Abdomen x-ray obtained this morning showed nonobstructive bowel gas pattern  Normocytic anemia -Likely in setting of lymphoma -S/p 3 units PRBC with hemoglobin is 6.6 today -Check stool for FOBT -Will transfuse 1 unit PRBC   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 for chemotherapy initiation after port insertion -Due to disease burden he was placed on prednisone 60 mg daily for 10 days, last dose was on the day of admission -Oncology saw patient yesterday, plan for R-miniCHOP 1st cycle as inpatient   Thrombocytopenia -Platelet count is stable  Insomnia, anxiety, nightmares -Continue Seroquel nightly  History of prostate cancer -S/p prostatectomy  L4 compression fracture/back pain -Complains of pain in the back and legs -Will start fentanyl patch 50 mcg daily -Continue morphine 2 mg IV every 2 hours as needed     Medications     sodium chloride   Intravenous Once   amiodarone  400 mg Oral BID   Followed by   Melene Muller ON 08/10/2022] amiodarone  200 mg Oral Daily   Chlorhexidine Gluconate Cloth  6 each Topical Daily   enoxaparin (LOVENOX) injection  40 mg Subcutaneous Q24H   feeding supplement (KATE FARMS STANDARD 1.4)  325 mL Oral BID BM   metoprolol tartrate  25 mg Oral BID   multivitamin with minerals  1 tablet Oral Daily   polyethylene glycol  17 g Oral Daily   QUEtiapine  25 mg Oral QHS   senna  1 tablet Oral  Daily     Data Reviewed:   CBG:  Recent Labs  Lab 07/29/22 1516 08/01/22 0010  GLUCAP 93 110*    SpO2: 98  % O2 Flow Rate (L/min): 2 L/min    Vitals:   08/04/22 0311 08/04/22 0400 08/04/22 0500 08/04/22 0800  BP:  121/66 (!) 140/51   Pulse:  90 100   Resp:  (!) 23 (!) 22   Temp: 97.7 F (36.5 C)   99.3 F (37.4 C)  TempSrc: Oral   Oral  SpO2:  100% 98%   Weight:      Height:          Data Reviewed:  Basic Metabolic Panel: Recent Labs  Lab 07/30/22 0410 07/31/22 0405 07/31/22 2254 08/01/22 0251 08/02/22 0004 08/03/22 0848  NA 127* 128*  --  124* 125* 126*  K 4.2 4.0  --  3.6 4.0 4.1  CL 97* 96*  --  95* 97* 96*  CO2 20* 21*  --  20* 19* 19*  GLUCOSE 106* 82  --  112* 117* 111*  BUN 21 16  --  22 24* 31*  CREATININE 0.77 0.70  --  0.84 0.82 0.97  CALCIUM 8.0* 7.9*  --  7.6* 7.5* 8.1*  MG  --   --  1.8  --  1.7  --   PHOS  --   --  2.8  --  2.8  --     CBC: Recent Labs  Lab 07/29/22 1520 07/30/22 0410 07/31/22 0405 08/01/22 0251 08/02/22 0004 08/03/22 0848 08/03/22 1929  WBC 13.8* 16.0* 12.2* 10.2 10.1 9.0  --   NEUTROABS 10.3*  --   --   --   --   --   --   HGB 6.4* 8.1* 6.8* 8.0* 7.4* 6.6* 8.7*  HCT 20.1* 24.5* 21.9* 24.7* 23.4* 20.7* 27.7*  MCV 80.1 80.6 83.9 83.4 84.8 83.8  --   PLT 109* 110* 91* 87* 92* 95*  --     LFT Recent Labs  Lab 07/29/22 1520 07/30/22 0410 07/31/22 0405 08/01/22 0251  AST 33 39 38 45*  ALT 40 34 29 34  ALKPHOS 89 89 72 74  BILITOT 2.0* 1.8* 2.2* 2.0*  PROT 5.4* 5.1* 4.6* 4.7*  ALBUMIN 2.3* 2.3* 2.0* 2.0*     Antibiotics: Anti-infectives (From admission, onward)    Start     Dose/Rate Route Frequency Ordered Stop   07/30/22 1600  vancomycin (VANCOREADY) IVPB 1750 mg/350 mL  Status:  Discontinued        1,750 mg 175 mL/hr over 120 Minutes Intravenous Every 24 hours 07/29/22 2108 08/01/22 1135   07/30/22 0000  ceFEPIme (MAXIPIME) 2 g in sodium chloride 0.9 % 100 mL IVPB        2 g 200 mL/hr over 30 Minutes Intravenous Every 8 hours 07/29/22 2108     07/29/22 1530  ceFEPIme (MAXIPIME) 2 g in sodium chloride 0.9 %  100 mL IVPB        2 g 200 mL/hr over 30 Minutes Intravenous  Once 07/29/22 1517 07/29/22 1628   07/29/22 1530  metroNIDAZOLE (FLAGYL) IVPB 500 mg        500 mg 100 mL/hr over 60 Minutes Intravenous  Once 07/29/22 1517 07/29/22 1701   07/29/22 1530  vancomycin (VANCOCIN) IVPB 1000 mg/200 mL premix  Status:  Discontinued        1,000 mg 200 mL/hr over 60 Minutes Intravenous  Once 07/29/22 1517 07/29/22 1520   07/29/22  1530  vancomycin (VANCOREADY) IVPB 1750 mg/350 mL        1,750 mg 175 mL/hr over 120 Minutes Intravenous  Once 07/29/22 1520 07/29/22 1908        DVT prophylaxis: Lovenox  Code Status: Full code  Family Communication: Discussed with patient's daughter at bedside   CONSULTS    Subjective   Complains of pain in the back and legs   Physical Examination:  General-appears in no acute distress Heart-S1-S2, regular, no murmur auscultated Lungs-clear to auscultation bilaterally, no wheezing or crackles auscultated Abdomen-soft, nontender, no organomegaly Extremities-no edema in the lower extremities Neuro-alert, oriented x3, no focal deficit noted  Status is: Inpatient:          Meredeth Ide   [facility] If 7PM-7AM, please contact night-coverage at [email or web address], Office  [phone]   08/04/2022, 8:31 AM  LOS: 6 days

## 2022-08-05 ENCOUNTER — Other Ambulatory Visit: Payer: Self-pay

## 2022-08-05 ENCOUNTER — Inpatient Hospital Stay (HOSPITAL_COMMUNITY): Payer: 59

## 2022-08-05 ENCOUNTER — Encounter: Payer: Self-pay | Admitting: Hematology

## 2022-08-05 ENCOUNTER — Other Ambulatory Visit (HOSPITAL_COMMUNITY): Payer: Self-pay

## 2022-08-05 ENCOUNTER — Other Ambulatory Visit (HOSPITAL_COMMUNITY): Payer: 59

## 2022-08-05 ENCOUNTER — Ambulatory Visit (HOSPITAL_COMMUNITY): Payer: 59

## 2022-08-05 DIAGNOSIS — C8338 Diffuse large B-cell lymphoma, lymph nodes of multiple sites: Secondary | ICD-10-CM | POA: Diagnosis not present

## 2022-08-05 DIAGNOSIS — I10 Essential (primary) hypertension: Secondary | ICD-10-CM | POA: Diagnosis not present

## 2022-08-05 DIAGNOSIS — Z5111 Encounter for antineoplastic chemotherapy: Secondary | ICD-10-CM | POA: Diagnosis not present

## 2022-08-05 DIAGNOSIS — D649 Anemia, unspecified: Secondary | ICD-10-CM | POA: Diagnosis not present

## 2022-08-05 DIAGNOSIS — I4891 Unspecified atrial fibrillation: Secondary | ICD-10-CM | POA: Diagnosis not present

## 2022-08-05 LAB — COMPREHENSIVE METABOLIC PANEL
ALT: 38 U/L (ref 0–44)
AST: 76 U/L — ABNORMAL HIGH (ref 15–41)
Albumin: 1.9 g/dL — ABNORMAL LOW (ref 3.5–5.0)
Alkaline Phosphatase: 94 U/L (ref 38–126)
Anion gap: 10 (ref 5–15)
BUN: 43 mg/dL — ABNORMAL HIGH (ref 8–23)
CO2: 18 mmol/L — ABNORMAL LOW (ref 22–32)
Calcium: 8.8 mg/dL — ABNORMAL LOW (ref 8.9–10.3)
Chloride: 98 mmol/L (ref 98–111)
Creatinine, Ser: 1.36 mg/dL — ABNORMAL HIGH (ref 0.61–1.24)
GFR, Estimated: 56 mL/min — ABNORMAL LOW (ref >60)
Glucose, Bld: 113 mg/dL — ABNORMAL HIGH (ref 70–99)
Potassium: 5.5 mmol/L — ABNORMAL HIGH (ref 3.5–5.1)
Sodium: 126 mmol/L — ABNORMAL LOW (ref 135–145)
Total Bilirubin: 1.9 mg/dL — ABNORMAL HIGH (ref 0.3–1.2)
Total Protein: 5 g/dL — ABNORMAL LOW (ref 6.5–8.1)

## 2022-08-05 LAB — CBC
HCT: 24.5 % — ABNORMAL LOW (ref 39.0–52.0)
Hemoglobin: 7.6 g/dL — ABNORMAL LOW (ref 13.0–17.0)
MCH: 27 pg (ref 26.0–34.0)
MCHC: 31 g/dL (ref 30.0–36.0)
MCV: 86.9 fL (ref 80.0–100.0)
Platelets: 96 10*3/uL — ABNORMAL LOW (ref 150–400)
RBC: 2.82 MIL/uL — ABNORMAL LOW (ref 4.22–5.81)
RDW: 19.3 % — ABNORMAL HIGH (ref 11.5–15.5)
WBC: 9.3 10*3/uL (ref 4.0–10.5)
nRBC: 0.6 % — ABNORMAL HIGH (ref 0.0–0.2)

## 2022-08-05 LAB — CULTURE, BLOOD (ROUTINE X 2)

## 2022-08-05 LAB — TYPE AND SCREEN

## 2022-08-05 LAB — HEPATITIS B SURFACE ANTIBODY,QUALITATIVE: Hep B S Ab: NONREACTIVE

## 2022-08-05 LAB — BPAM RBC: Blood Product Expiration Date: 202407112359

## 2022-08-05 MED ORDER — AMIODARONE HCL 200 MG PO TABS
400.0000 mg | ORAL_TABLET | Freq: Two times a day (BID) | ORAL | Status: AC
  Administered 2022-08-05 – 2022-08-09 (×9): 400 mg via NASOGASTRIC
  Filled 2022-08-05 (×9): qty 2

## 2022-08-05 MED ORDER — SODIUM CHLORIDE 0.9% FLUSH
10.0000 mL | Freq: Two times a day (BID) | INTRAVENOUS | Status: DC
  Administered 2022-08-05: 20 mL
  Administered 2022-08-06: 10 mL
  Administered 2022-08-06: 30 mL
  Administered 2022-08-07: 10 mL
  Administered 2022-08-07: 30 mL
  Administered 2022-08-08: 40 mL
  Administered 2022-08-08 – 2022-08-12 (×7): 10 mL
  Administered 2022-08-12: 30 mL
  Administered 2022-08-13 (×2): 10 mL
  Administered 2022-08-14 (×2): 20 mL
  Administered 2022-08-15 – 2022-08-16 (×3): 10 mL

## 2022-08-05 MED ORDER — SODIUM CHLORIDE 0.9 % IV SOLN
12.5000 mg | Freq: Four times a day (QID) | INTRAVENOUS | Status: DC | PRN
  Administered 2022-08-05: 12.5 mg via INTRAVENOUS
  Filled 2022-08-05: qty 12.5

## 2022-08-05 MED ORDER — SODIUM CHLORIDE 0.9% FLUSH
10.0000 mL | INTRAVENOUS | Status: DC | PRN
  Administered 2022-08-15: 20 mL

## 2022-08-05 MED ORDER — METOPROLOL TARTRATE 25 MG PO TABS
25.0000 mg | ORAL_TABLET | Freq: Two times a day (BID) | ORAL | Status: DC
  Administered 2022-08-05 – 2022-08-08 (×6): 25 mg via NASOGASTRIC
  Filled 2022-08-05 (×6): qty 1

## 2022-08-05 MED ORDER — AMIODARONE HCL 200 MG PO TABS
200.0000 mg | ORAL_TABLET | Freq: Every day | ORAL | Status: DC
  Administered 2022-08-10 – 2022-08-21 (×12): 200 mg
  Filled 2022-08-05 (×12): qty 1

## 2022-08-05 MED ORDER — ALBUMIN HUMAN 25 % IV SOLN
25.0000 g | Freq: Once | INTRAVENOUS | Status: AC
  Administered 2022-08-05: 25 g via INTRAVENOUS
  Filled 2022-08-05: qty 100

## 2022-08-05 MED ORDER — SENNA 8.6 MG PO TABS: 2.0000 | ORAL_TABLET | Freq: Every day | ORAL | Status: DC

## 2022-08-05 MED ORDER — IPRATROPIUM-ALBUTEROL 0.5-2.5 (3) MG/3ML IN SOLN
3.0000 mL | Freq: Four times a day (QID) | RESPIRATORY_TRACT | Status: DC | PRN
  Administered 2022-08-05 – 2022-08-06 (×3): 3 mL via RESPIRATORY_TRACT
  Filled 2022-08-05 (×2): qty 3

## 2022-08-05 MED ORDER — ADULT MULTIVITAMIN W/MINERALS CH
1.0000 | ORAL_TABLET | Freq: Every day | ORAL | Status: DC
  Administered 2022-08-06 – 2022-08-09 (×4): 1
  Filled 2022-08-05 (×4): qty 1

## 2022-08-05 MED ORDER — SODIUM ZIRCONIUM CYCLOSILICATE 5 G PO PACK
5.0000 g | PACK | Freq: Once | ORAL | Status: AC
  Administered 2022-08-05: 5 g via ORAL
  Filled 2022-08-05 (×2): qty 1

## 2022-08-05 MED ORDER — QUETIAPINE FUMARATE 50 MG PO TABS
25.0000 mg | ORAL_TABLET | Freq: Every day | ORAL | Status: DC
  Administered 2022-08-05 – 2022-08-06 (×2): 25 mg via NASOGASTRIC
  Filled 2022-08-05 (×2): qty 1

## 2022-08-05 MED ORDER — CHLORPROMAZINE HCL 10 MG PO TABS
10.0000 mg | ORAL_TABLET | Freq: Four times a day (QID) | ORAL | Status: DC | PRN
  Administered 2022-08-14: 10 mg via NASOGASTRIC
  Filled 2022-08-05 (×2): qty 1

## 2022-08-05 MED ORDER — SENNA 8.6 MG PO TABS
2.0000 | ORAL_TABLET | Freq: Every day | ORAL | Status: DC
  Administered 2022-08-12: 17.2 mg via NASOGASTRIC
  Filled 2022-08-05: qty 2

## 2022-08-05 MED ORDER — CHLORPROMAZINE HCL 10 MG PO TABS
10.0000 mg | ORAL_TABLET | Freq: Four times a day (QID) | ORAL | Status: DC | PRN
  Filled 2022-08-05: qty 1

## 2022-08-05 NOTE — Progress Notes (Signed)
PT Cancellation Note  Patient Details Name: Tyler [name] MRN: [id number] DOB: 1950/06/11   Cancelled Treatment:    Reason Eval/Treat Not Completed: Medical issues which prohibited therapy. RN reports pt with wheezing and increased work of breathing, transferred to stepdown unit. Will defer PT at this time and continue to follow as appropriate.    Domenick Bookbinder PT, DPT 08/05/22, 10:43 AM

## 2022-08-05 NOTE — Progress Notes (Addendum)
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 intake, tachycardia and episode of shaking and unresponsiveness then confused thereafter.  He was hypotensive, tachycardic and had lactic acidosis.  Temperature 100.7.  Blood cultures were obtained and patient started on IV antibiotics for sepsis of unclear origin.    Assessment/Plan:   Lactic acidosis/sepsis -Unclear source, started on vancomycin and cefepime -Blood culture showed no growth till date -Received IV fluids, blood pressure is stable - vancomycin was discontinued, continue cefepime -Will discontinue cefepime after 7 days  Paroxysmal atrial fibrillation - transferred to SDU  -started on amiodarone gtt -Not a candidate for anticoagulation; he has a history of PE and was on Eliquis which was stopped in 2023 after he developed hematuria.  Also has history of thrombocytopenia with normocytic anemia -He is now back to normal sinus rhythm. -Cardiology consulted; have signed off,  Dyspnea/wheezing -Chest x-ray showed widened mediastinum, discussed with pulmonology. -Repeat CT chest with contrast obtained shows significant progression of bulky bilateral axillary, bilateral hilar, bilateral mediastinal, bilateral lower leg and upper abdominal lymphadenopathy.  Multiple new splenic masses.  Finding compatible with aggressive lymphoma -Plan to start chemotherapy today  Seizure-like activity -Reported prior to admission with short postictal. -MRI brain showed multifocal hyperintense T2 weighted signal within the white matter, most consistent with chronic small vessel ischemia -EEG obtained shows mild diffuse encephalopathy, no seizures or epileptiform discharges  Right facial paresthesia -CT maxillofacial showed large periapical lucency  at the root of the most posterior remaining right maxillary tooth.  No abscess -Right anterior ethmoid and maxillary sinus complete opacification with polypoid continues, possibly antrochoanal polyp -Multiple enlarged submandibular lymph nodes measuring up to 1.6 cm, likely reactive  Acute kidney injury -Secondary to dehydration, resolved  Chronic hyponatremia -Sodium has been low for past 1 month -Sodium is low at 125 -Serum osmolality is 271, urine osmolality 702  Constipation -Had BM yesterday with suppository -Abdomen x-ray obtained  showed nonobstructive bowel gas pattern  Normocytic anemia -Likely in setting of lymphoma -S/p 3 units PRBC with hemoglobin is 6.6 today -Check stool for FOBT -Will transfuse 1 unit PRBC   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 for chemotherapy initiation after port insertion -Due to disease burden he was placed on prednisone 60 mg daily for 10 days, last dose was on the day of admission -Oncology saw patient yesterday, plan for R-miniCHOP 1st cycle as inpatient  -Discussed with Dr. Candise Che, will insert PICC line and plan to start chemo today  Thrombocytopenia -Platelet count is stable  Insomnia, anxiety, nightmares -Continue Seroquel nightly  History of prostate cancer -S/p prostatectomy  L4 compression fracture/back pain -Complains of pain in the back and legs -Continue fentanyl patch 50 mcg daily -Continue morphine 2 mg IV every 2 hours as needed  Hiccups -Start Thorazine 10 mg p.o. 3 times daily as needed  Hyperkalemia -Potassium is 5.5 -Will give 1 dose of Lokelma 5 g p.o. -Follow BMP in am  Medications     sodium chloride   Intravenous Once   amiodarone  400 mg Oral BID   Followed by   Melene Muller ON 08/10/2022] amiodarone  200 mg Oral Daily   Chlorhexidine Gluconate Cloth  6 each Topical Daily   enoxaparin (LOVENOX) injection  40 mg Subcutaneous Q24H   feeding supplement (KATE FARMS STANDARD  1.4)  325 mL Oral BID BM    fentaNYL  1 patch Transdermal Q72H   metoprolol tartrate  25 mg Oral BID   multivitamin with minerals  1 tablet Oral Daily   polyethylene glycol  17 g Oral Daily   QUEtiapine  25 mg Oral QHS   senna  2 tablet Oral Daily   sodium zirconium cyclosilicate  5 g Oral Once     Data Reviewed:   CBG:  Recent Labs  Lab 07/29/22 1516 08/01/22 0010  GLUCAP 93 110*    SpO2: 98 % O2 Flow Rate (L/min): 2 L/min    Vitals:   08/04/22 1200 08/04/22 1359 08/04/22 2107 08/05/22 0537  BP:  125/74 116/73 116/76  Pulse:  92 99 99  Resp:  18 17 17   Temp: 99.2 F (37.3 C) 98.3 F (36.8 C) 99.2 F (37.3 C) 98.2 F (36.8 C)  TempSrc: Oral Oral Oral Oral  SpO2:  99% 98% 98%  Weight:      Height:          Data Reviewed:  Basic Metabolic Panel: Recent Labs  Lab 07/31/22 0405 07/31/22 2254 08/01/22 0251 08/02/22 0004 08/03/22 0848 08/05/22 0617  NA 128*  --  124* 125* 126* 126*  K 4.0  --  3.6 4.0 4.1 5.5*  CL 96*  --  95* 97* 96* 98  CO2 21*  --  20* 19* 19* 18*  GLUCOSE 82  --  112* 117* 111* 113*  BUN 16  --  22 24* 31* 43*  CREATININE 0.70  --  0.84 0.82 0.97 1.36*  CALCIUM 7.9*  --  7.6* 7.5* 8.1* 8.8*  MG  --  1.8  --  1.7  --   --   PHOS  --  2.8  --  2.8  --   --     CBC: Recent Labs  Lab 07/29/22 1520 07/30/22 0410 07/31/22 0405 08/01/22 0251 08/02/22 0004 08/03/22 0848 08/03/22 1929 08/05/22 0617  WBC 13.8*   < > 12.2* 10.2 10.1 9.0  --  9.3  NEUTROABS 10.3*  --   --   --   --   --   --   --   HGB 6.4*   < > 6.8* 8.0* 7.4* 6.6* 8.7* 7.6*  HCT 20.1*   < > 21.9* 24.7* 23.4* 20.7* 27.7* 24.5*  MCV 80.1   < > 83.9 83.4 84.8 83.8  --  86.9  PLT 109*   < > 91* 87* 92* 95*  --  96*   < > = values in this interval not displayed.    LFT Recent Labs  Lab 07/29/22 1520 07/30/22 0410 07/31/22 0405 08/01/22 0251 08/05/22 0617  AST 33 39 38 45* 76*  ALT 40 34 29 34 38  ALKPHOS 89 89 72 74 94  BILITOT 2.0* 1.8* 2.2* 2.0* 1.9*  PROT 5.4* 5.1* 4.6* 4.7*  5.0*  ALBUMIN 2.3* 2.3* 2.0* 2.0* 1.9*     Antibiotics: Anti-infectives (From admission, onward)    Start     Dose/Rate Route Frequency Ordered Stop   07/30/22 1600  vancomycin (VANCOREADY) IVPB 1750 mg/350 mL  Status:  Discontinued        1,750 mg 175 mL/hr over 120 Minutes Intravenous Every 24 hours 07/29/22 2108 08/01/22 1135   07/30/22 0000  ceFEPIme (MAXIPIME) 2 g in sodium chloride 0.9 % 100 mL IVPB        2 g 200 mL/hr over 30 Minutes Intravenous Every  8 hours 07/29/22 2108 08/05/22 2359   07/29/22 1530  ceFEPIme (MAXIPIME) 2 g in sodium chloride 0.9 % 100 mL IVPB        2 g 200 mL/hr over 30 Minutes Intravenous  Once 07/29/22 1517 07/29/22 1628   07/29/22 1530  metroNIDAZOLE (FLAGYL) IVPB 500 mg        500 mg 100 mL/hr over 60 Minutes Intravenous  Once 07/29/22 1517 07/29/22 1701   07/29/22 1530  vancomycin (VANCOCIN) IVPB 1000 mg/200 mL premix  Status:  Discontinued        1,000 mg 200 mL/hr over 60 Minutes Intravenous  Once 07/29/22 1517 07/29/22 1520   07/29/22 1530  vancomycin (VANCOREADY) IVPB 1750 mg/350 mL        1,750 mg 175 mL/hr over 120 Minutes Intravenous  Once 07/29/22 1520 07/29/22 1908        DVT prophylaxis: Lovenox  Code Status: Full code  Family Communication: Discussed with patient's daughter on phone  CONSULTS    Subjective   Patient states that pain has improved.  Continues to have wheezing.  Complains of hiccups today.  Physical Examination:  General-appears in no acute distress Heart-S1-S2, regular, no murmur auscultated Lungs-bilateral wheezing auscultated Abdomen-soft, nontender, no organomegaly Extremities-no edema in the lower extremities Neuro-alert, oriented x3, no focal deficit noted  Status is: Inpatient:          Kazia Grisanti S Millenia Waldvogel   [facility] If 7PM-7AM, please contact night-coverage at [email or web address], Office  [phone]   08/05/2022, 8:05 AM  LOS: 7 days

## 2022-08-05 NOTE — Progress Notes (Signed)
Peripherally Inserted Central Catheter Placement  The IV Nurse has discussed with the patient and/or persons authorized to consent for the patient, the purpose of this procedure and the potential benefits and risks involved with this procedure.  The benefits include less needle sticks, lab draws from the catheter, and the patient may be discharged home with the catheter. Risks include, but not limited to, infection, bleeding, blood clot (thrombus formation), and puncture of an artery; nerve damage and irregular heartbeat and possibility to perform a PICC exchange if needed/ordered by physician.  Alternatives to this procedure were also discussed.  Bard Power PICC patient education guide, fact sheet on infection prevention and patient information card has been provided to patient /or left at bedside.    PICC Placement Documentation  PICC Double Lumen 08/05/22 Right Brachial 41 cm 2 cm (Active)  Indication for Insertion or Continuance of Line Administration of hyperosmolar/irritating solutions (i.e. TPN, Vancomycin, etc.) 08/05/22 1700  Exposed Catheter (cm) 2 cm 08/05/22 1700  Site Assessment Clean, Dry, Intact 08/05/22 1700  Lumen #1 Status Saline locked;Flushed;Blood return noted 08/05/22 1700  Lumen #2 Status Saline locked;Flushed;Blood return noted 08/05/22 1700  Dressing Type Transparent;Securing device 08/05/22 1700  Dressing Status Antimicrobial disc in place;Clean, Dry, Intact 08/05/22 1700  Safety Lock Not Applicable 08/05/22 1700  Line Care Connections checked and tightened 08/05/22 1700  Line Adjustment (NICU/IV Team Only) No 08/05/22 1700  Dressing Intervention New dressing 08/05/22 1700  Dressing Change Due 08/12/22 08/05/22 1700       Tyler [name] 08/05/2022, 5:15 PM

## 2022-08-05 NOTE — TOC Benefit Eligibility Note (Signed)
Patient Product/process development scientist completed.    The patient is currently admitted and upon discharge could be taking Vemlidy 25 mg.  Requires Prior Authorization  The patient is insured through [employer] and [place]   This test claim was processed through [employer]- copay amounts may vary at other pharmacies due to pharmacy/plan contracts, or as the patient moves through the different stages of their insurance plan.  Roland Earl, CPHT Pharmacy Patient Advocate Specialist [facility] Patient Advocate Team Direct Number: [phone]  Fax: [phone]

## 2022-08-06 ENCOUNTER — Other Ambulatory Visit (HOSPITAL_COMMUNITY): Payer: Self-pay

## 2022-08-06 ENCOUNTER — Inpatient Hospital Stay: Payer: 59

## 2022-08-06 ENCOUNTER — Inpatient Hospital Stay (HOSPITAL_COMMUNITY): Payer: 59

## 2022-08-06 ENCOUNTER — Telehealth (HOSPITAL_COMMUNITY): Payer: Self-pay | Admitting: Pharmacy Technician

## 2022-08-06 ENCOUNTER — Inpatient Hospital Stay: Payer: 59 | Admitting: Hematology

## 2022-08-06 DIAGNOSIS — D649 Anemia, unspecified: Secondary | ICD-10-CM | POA: Diagnosis not present

## 2022-08-06 DIAGNOSIS — R55 Syncope and collapse: Secondary | ICD-10-CM | POA: Diagnosis not present

## 2022-08-06 DIAGNOSIS — Z5111 Encounter for antineoplastic chemotherapy: Secondary | ICD-10-CM | POA: Diagnosis not present

## 2022-08-06 DIAGNOSIS — I4891 Unspecified atrial fibrillation: Secondary | ICD-10-CM | POA: Diagnosis not present

## 2022-08-06 DIAGNOSIS — C8338 Diffuse large B-cell lymphoma, lymph nodes of multiple sites: Secondary | ICD-10-CM | POA: Diagnosis not present

## 2022-08-06 DIAGNOSIS — I10 Essential (primary) hypertension: Secondary | ICD-10-CM | POA: Diagnosis not present

## 2022-08-06 DIAGNOSIS — Z9189 Other specified personal risk factors, not elsewhere classified: Secondary | ICD-10-CM

## 2022-08-06 LAB — COMPREHENSIVE METABOLIC PANEL
ALT: 37 U/L (ref 0–44)
AST: 65 U/L — ABNORMAL HIGH (ref 15–41)
Albumin: 2 g/dL — ABNORMAL LOW (ref 3.5–5.0)
Alkaline Phosphatase: 96 U/L (ref 38–126)
Anion gap: 11 (ref 5–15)
BUN: 60 mg/dL — ABNORMAL HIGH (ref 8–23)
CO2: 19 mmol/L — ABNORMAL LOW (ref 22–32)
Calcium: 9 mg/dL (ref 8.9–10.3)
Chloride: 98 mmol/L (ref 98–111)
Creatinine, Ser: 2.38 mg/dL — ABNORMAL HIGH (ref 0.61–1.24)
GFR, Estimated: 29 mL/min — ABNORMAL LOW (ref >60)
Glucose, Bld: 88 mg/dL (ref 70–99)
Potassium: 5.7 mmol/L — ABNORMAL HIGH (ref 3.5–5.1)
Sodium: 128 mmol/L — ABNORMAL LOW (ref 135–145)
Total Bilirubin: 2.3 mg/dL — ABNORMAL HIGH (ref 0.3–1.2)
Total Protein: 4.7 g/dL — ABNORMAL LOW (ref 6.5–8.1)

## 2022-08-06 LAB — SODIUM, URINE, RANDOM: Sodium, Ur: <10 mmol/L

## 2022-08-06 LAB — PREPARE RBC (CROSSMATCH)

## 2022-08-06 LAB — TYPE AND SCREEN: Unit division: 0

## 2022-08-06 LAB — CULTURE, BLOOD (ROUTINE X 2)
Culture: NO GROWTH
Special Requests: ADEQUATE
Special Requests: ADEQUATE

## 2022-08-06 LAB — HEPATITIS B DNA, ULTRAQUANTITATIVE, PCR
HBV DNA SERPL PCR-ACNC: <10 IU/mL
HBV DNA SERPL PCR-LOG IU: UNDETERMINED log10 IU/mL

## 2022-08-06 LAB — CBC
HCT: 20.4 % — ABNORMAL LOW (ref 39.0–52.0)
Hemoglobin: 6.3 g/dL — CL (ref 13.0–17.0)
MCH: 27.4 pg (ref 26.0–34.0)
MCHC: 30.9 g/dL (ref 30.0–36.0)
MCV: 88.7 fL (ref 80.0–100.0)
Platelets: 83 10*3/uL — ABNORMAL LOW (ref 150–400)
RBC: 2.3 MIL/uL — ABNORMAL LOW (ref 4.22–5.81)
RDW: 19.8 % — ABNORMAL HIGH (ref 11.5–15.5)
WBC: 7.7 10*3/uL (ref 4.0–10.5)
nRBC: 0.8 % — ABNORMAL HIGH (ref 0.0–0.2)

## 2022-08-06 LAB — URINALYSIS, ROUTINE W REFLEX MICROSCOPIC
Glucose, UA: NEGATIVE mg/dL
Ketones, ur: 5 mg/dL — AB
Leukocytes,Ua: NEGATIVE
Nitrite: NEGATIVE
Protein, ur: 100 mg/dL — AB
Specific Gravity, Urine: 1.023 (ref 1.005–1.030)
pH: 5 (ref 5.0–8.0)

## 2022-08-06 LAB — HEMOGLOBIN AND HEMATOCRIT, BLOOD
HCT: 24.3 % — ABNORMAL LOW (ref 39.0–52.0)
Hemoglobin: 7.7 g/dL — ABNORMAL LOW (ref 13.0–17.0)

## 2022-08-06 LAB — CREATININE, URINE, RANDOM: Creatinine, Urine: 130 mg/dL

## 2022-08-06 LAB — URIC ACID: Uric Acid, Serum: 9.3 mg/dL — ABNORMAL HIGH (ref 3.7–8.6)

## 2022-08-06 LAB — GLUCOSE, CAPILLARY: Glucose-Capillary: 99 mg/dL (ref 70–99)

## 2022-08-06 MED ORDER — ONDANSETRON HCL 4 MG/2ML IJ SOLN
4.0000 mg | Freq: Four times a day (QID) | INTRAMUSCULAR | Status: DC | PRN
  Administered 2022-08-11 – 2022-08-16 (×2): 4 mg via INTRAVENOUS
  Filled 2022-08-06 (×2): qty 2

## 2022-08-06 MED ORDER — ONDANSETRON HCL 4 MG PO TABS: 4.0000 mg | ORAL_TABLET | Freq: Four times a day (QID) | ORAL | Status: DC | PRN

## 2022-08-06 MED ORDER — LACTATED RINGERS IV SOLN: INTRAVENOUS | Status: DC

## 2022-08-06 MED ORDER — HEPARIN SODIUM (PORCINE) 5000 UNIT/ML IJ SOLN
5000.0000 [IU] | Freq: Three times a day (TID) | INTRAMUSCULAR | Status: DC
  Administered 2022-08-06 – 2022-08-07 (×2): 5000 [IU] via SUBCUTANEOUS
  Filled 2022-08-06 (×2): qty 1

## 2022-08-06 MED ORDER — ACETAMINOPHEN 160 MG/5ML PO SOLN
650.0000 mg | Freq: Once | ORAL | Status: DC
  Administered 2022-08-06: 650 mg
  Filled 2022-08-06: qty 20.3

## 2022-08-06 MED ORDER — FUROSEMIDE 10 MG/ML IJ SOLN: 80.0000 mg | Freq: Two times a day (BID) | INTRAMUSCULAR | Status: DC

## 2022-08-06 MED ORDER — HYDROMORPHONE HCL 1 MG/ML IJ SOLN
0.5000 mg | INTRAMUSCULAR | Status: DC | PRN
  Administered 2022-08-06 – 2022-08-08 (×7): 0.5 mg via INTRAVENOUS
  Filled 2022-08-06 (×7): qty 1

## 2022-08-06 MED ORDER — SODIUM CHLORIDE 0.9 % IV SOLN: Freq: Once | INTRAVENOUS | Status: AC

## 2022-08-06 MED ORDER — DIPHENHYDRAMINE HCL 25 MG PO CAPS: 50.0000 mg | ORAL_CAPSULE | Freq: Once | ORAL | Status: DC

## 2022-08-06 MED ORDER — ALLOPURINOL 100 MG PO TABS
100.0000 mg | ORAL_TABLET | Freq: Two times a day (BID) | ORAL | Status: DC
  Administered 2022-08-06 – 2022-08-21 (×31): 100 mg
  Filled 2022-08-06 (×31): qty 1

## 2022-08-06 MED ORDER — PHENOL 1.4 % MT LIQD
1.0000 | OROMUCOSAL | Status: DC | PRN
  Administered 2022-08-06: 1 via OROMUCOSAL
  Filled 2022-08-06: qty 177

## 2022-08-06 MED ORDER — SODIUM CHLORIDE 0.9% IV SOLUTION: Freq: Once | INTRAVENOUS | Status: AC

## 2022-08-06 MED ORDER — SODIUM POLYSTYRENE SULFONATE 15 GM/60ML PO SUSP
45.0000 g | Freq: Once | ORAL | Status: AC
  Administered 2022-08-06: 45 g via RECTAL
  Filled 2022-08-06: qty 180

## 2022-08-06 MED ORDER — VINCRISTINE SULFATE CHEMO INJECTION 1 MG/ML
1.0000 mg | Freq: Once | INTRAVENOUS | Status: AC
  Administered 2022-08-06: 1 mg via INTRAVENOUS
  Filled 2022-08-06: qty 1

## 2022-08-06 MED ORDER — PALONOSETRON HCL INJECTION 0.25 MG/5ML
0.2500 mg | Freq: Once | INTRAVENOUS | Status: AC
  Administered 2022-08-06: 0.25 mg via INTRAVENOUS
  Filled 2022-08-06: qty 5

## 2022-08-06 MED ORDER — DIPHENHYDRAMINE HCL 50 MG/ML IJ SOLN: 50.0000 mg | Freq: Once | INTRAMUSCULAR | Status: DC

## 2022-08-06 MED ORDER — SODIUM CHLORIDE 0.9 % IV SOLN
500.0000 mg/m2 | Freq: Once | INTRAVENOUS | Status: AC
  Administered 2022-08-06: 1000 mg via INTRAVENOUS
  Filled 2022-08-06: qty 50

## 2022-08-06 MED ORDER — COLD PACK MISC ONCOLOGY: 1.0000 | Freq: Once | Status: AC | PRN

## 2022-08-06 MED ORDER — SODIUM CHLORIDE 0.9 % IV SOLN
150.0000 mg | Freq: Once | INTRAVENOUS | Status: AC
  Administered 2022-08-06: 150 mg via INTRAVENOUS
  Filled 2022-08-06: qty 5

## 2022-08-06 MED ORDER — SODIUM CHLORIDE 0.9 % IV SOLN: 375.0000 mg/m2 | Freq: Once | INTRAVENOUS | Status: DC

## 2022-08-06 MED ORDER — SODIUM CHLORIDE 0.9 % IV SOLN
10.0000 mg | Freq: Once | INTRAVENOUS | Status: AC
  Administered 2022-08-06: 10 mg via INTRAVENOUS
  Filled 2022-08-06: qty 1

## 2022-08-06 MED ORDER — ACETAMINOPHEN 325 MG PO TABS
650.0000 mg | ORAL_TABLET | Freq: Once | ORAL | Status: DC
Start: 2022-08-06 — End: 2022-08-06

## 2022-08-06 MED ORDER — METHYLPREDNISOLONE SODIUM SUCC 125 MG IJ SOLR
80.0000 mg | Freq: Every day | INTRAMUSCULAR | Status: AC
  Administered 2022-08-07 – 2022-08-10 (×4): 80 mg via INTRAVENOUS
  Filled 2022-08-06 (×4): qty 2

## 2022-08-06 MED ORDER — ACETAMINOPHEN 160 MG/5ML PO SOLN
650.0000 mg | Freq: Once | ORAL | Status: DC
  Filled 2022-08-06: qty 20.3

## 2022-08-06 MED ORDER — DIPHENHYDRAMINE HCL 50 MG/ML IJ SOLN
50.0000 mg | Freq: Once | INTRAMUSCULAR | Status: DC
  Administered 2022-08-06: 50 mg via INTRAVENOUS
  Filled 2022-08-06: qty 1

## 2022-08-06 MED ORDER — HOT PACK MISC ONCOLOGY: 1.0000 | Freq: Once | Status: AC | PRN

## 2022-08-06 MED ORDER — STERILE WATER FOR INJECTION IV SOLN
INTRAVENOUS | Status: DC
  Filled 2022-08-06 (×2): qty 150
  Filled 2022-08-06: qty 1000
  Filled 2022-08-06 (×3): qty 150
  Filled 2022-08-06: qty 1000

## 2022-08-06 MED ORDER — DOXORUBICIN HCL CHEMO IV INJECTION 2 MG/ML
25.0000 mg/m2 | Freq: Once | INTRAVENOUS | Status: AC
  Administered 2022-08-06: 50 mg via INTRAVENOUS
  Filled 2022-08-06: qty 25

## 2022-08-06 NOTE — Progress Notes (Addendum)
SLP Cancellation Note  Patient Details Name: Dontavious Emily MRN: [id number] DOB: 1950/09/03   Cancelled treatment:       Reason Eval/Treat Not Completed: Other (comment) (pt npo, required NG due to vomiting brown colored emesis; will continue efforts)  [name], MS [facility] SLP Acute Rehab Services Office [phone]  Chales Abrahams 08/06/2022, 2:22 PM

## 2022-08-06 NOTE — Consult Note (Signed)
Renal Service Consult Note [facility]  Tyler [name] 08/06/2022 Maree Krabbe, MD Requesting Physician: Dr. Sharl Ma  Reason for Consult: Renal failure HPI: The patient is a 72 y.o. year-old w/ PMH as below who presented to ED on 6/03 after having syncopal episode and a fall at home. Pt had been found to have pancytopenia in October last year and w/u showed a dysplastic syndrome and he was then followed by oncology. 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 He was getting prepared for possible chemotherapy when he began to lose his appetite and was not drinking much which brought him to the syncopal episode noted above. In ED bp was 70/40 w/ 130 HR and BP improved w/ IVF's. Pt appeared septic and was admitted and started on IV abx and hydrated. He was transfused 2u prbc's for severe anemia. He had a lactic acidosis but blood cx's were negative. He rec'd IV vanc and cefepime. He had some afib and was seen by cardiology. Not a candidate for a/c. He returned back to NSR. For SOB CXR was done showing widened mediastinum and chest CT showed progression of bulky bilat axillary, hilar and mediastinal LAN. CT abd showed bilat leg and upper abdominal LAN, multiple new splenic masses. All findings were c/w an aggressive lymphoma. The admission also was complicated by seizure-like activity, R facial numbness and chronic hyponatremia (Na+ as low as 125).  More recently he developed N/V and abdominal distension so an NG tube was placed w/ large amounts of output. Also pt developed AKI over the last 2-3 days w/ creatinine rising from baseline of 0.7- 0.9 up to 1.36 yesterday and up to 2.38 today.  UOP had maintained for the most part 300- 900 cc/d. The patient did have IV contrast on 6/08 w/ CT of the chest imaging, 75 cc IV contrast. The next day the creatinine started to rise. We were asked to see for renal failure.   Pt seen in room and  interviewed w/ assistance of a Swahili interpreter. Pt did not have any questions. He denied any hx of renal failure. No current c/o's.     ROS - denies CP, no joint pain, no HA, no blurry vision, no rash   Past Medical History  Past Medical History:  Diagnosis Date   Cancer [facility])    prostate   Hypertension    Past Surgical History  Past Surgical History:  Procedure Laterality Date   LYMPHADENECTOMY Bilateral 09/01/2019   Procedure: LYMPHADENECTOMY;  Surgeon: Sebastian Ache, MD;  Location: [facility] ORS;  Service: Urology;  Laterality: Bilateral;   ROBOT ASSISTED LAPAROSCOPIC RADICAL PROSTATECTOMY N/A 09/01/2019   Procedure: XI ROBOTIC ASSISTED LAPAROSCOPIC RADICAL PROSTATECTOMY;  Surgeon: Sebastian Ache, MD;  Location: [facility] ORS;  Service: Urology;  Laterality: N/A;  3 HRS   Family History  Family History  Problem Relation Age of Onset   Stroke Father    Social History  reports that he has never smoked. He has never used smokeless tobacco. He reports current alcohol use. He reports that he does not use drugs. Allergies  Allergies  Allergen Reactions   Amitriptyline Other (See Comments)    Nightmares, Night sweats, Migraines    Amlodipine Swelling    BILATERAL FEET TO ANKLES   Neurontin [Gabapentin] Other (See Comments)    Nightmares; Night sweats; Headache   Home medications Prior to Admission medications   Medication Sig Start Date End Date Taking? Authorizing Provider  allopurinol (  ZYLOPRIM) 100 MG tablet Take 1 tablet (100 mg total) by mouth 2 (two) times daily. 07/19/22  Yes Johney Maine, MD  B Complex Vitamins (VITAMIN B COMPLEX) TABS Take 1 tablet by mouth daily. 12/21/21  Yes Amin, Loura Halt, MD  cholecalciferol (VITAMIN D3) 25 MCG (1000 UNIT) tablet Take 2 tablets (2,000 Units total) by mouth daily. 01/21/22  Yes Johney Maine, MD  diclofenac Sodium (VOLTAREN) 1 % GEL APPLY 4 G TOPICALLY 4 (FOUR) TIMES DAILY. APPLY TO RIGHT LATERAL HIP AND THIGH DOWN TO  KNEE Patient taking differently: Apply 4 g topically daily as needed (For pain). 06/12/22  Yes Nestor Ramp, MD  folic acid (FOLVITE) 1 MG tablet Take 1 mg by mouth daily. 03/27/22  Yes [provider]  lidocaine-prilocaine (EMLA) cream Apply to affected area once Patient taking differently: Apply 1 Application topically daily as needed (For port). 07/26/22  Yes Johney Maine, MD  lisinopril (ZESTRIL) 5 MG tablet Take 5 mg by mouth daily.   Yes [provider]  Menthol-Methyl Salicylate (MUSCLE RUB) 10-15 % CREA Apply 1 Application topically daily as needed for muscle pain.   Yes [provider]  metoCLOPramide (REGLAN) 10 MG tablet Take 1 tablet (10 mg total) by mouth every 6 (six) hours as needed for nausea. 07/15/22  Yes Gerhard Munch, MD  Multiple Vitamin (MULTIVITAMIN WITH MINERALS) TABS tablet Take 1 tablet by mouth daily. 12/21/21  Yes Amin, Ankit Chirag, MD  ondansetron (ZOFRAN) 8 MG tablet Take 1 tablet (8 mg total) by mouth every 8 (eight) hours as needed for nausea or vomiting. Start on the third day after cyclophosphamide chemotherapy. 07/26/22  Yes Johney Maine, MD  oxyCODONE (OXY IR/ROXICODONE) 5 MG immediate release tablet Take 1 tablet (5 mg total) by mouth every 6 (six) hours as needed for severe pain. 07/19/22  Yes Johney Maine, MD  prochlorperazine (COMPAZINE) 10 MG tablet Take 1 tablet (10 mg total) by mouth every 6 (six) hours as needed for nausea or vomiting. 07/26/22  Yes Johney Maine, MD  senna-docusate (SENNA S) 8.6-50 MG tablet Take 2 tablets by mouth at bedtime as needed for mild constipation. 07/19/22  Yes Johney Maine, MD  acyclovir (ZOVIRAX) 400 MG tablet Take 1 tablet (400 mg total) by mouth daily. Patient not taking: Reported on 07/29/2022 07/26/22   Johney Maine, MD  allopurinol (ZYLOPRIM) 100 MG tablet Take 1 tablet (100 mg total) by mouth 2 (two) times daily. Patient not taking: Reported on 07/29/2022  07/26/22   Johney Maine, MD  amitriptyline (ELAVIL) 10 MG tablet TAKE 1 TABLET BY MOUTH EVERYDAY AT BEDTIME Patient not taking: Reported on 07/29/2022 07/23/22   Nestor Ramp, MD  gabapentin (NEURONTIN) 100 MG capsule Take 1 capsule (100 mg total) by mouth at bedtime for 14 days, THEN 2 capsules (200 mg total) at bedtime. 05/31/22 07/14/22  Claudie Leach, DO  predniSONE (DELTASONE) 20 MG tablet Take 3 tablets (60 mg total) by mouth daily. Take with food on days 2-6 of chemotherapy. Patient not taking: Reported on 07/29/2022 07/26/22   Johney Maine, MD     Vitals:   08/06/22 1600 08/06/22 1700 08/06/22 1726 08/06/22 1800  BP: (!) 108/54 125/66  127/64  Pulse: 71 73  82  Resp: 12 12  13   Temp: 98.2 F (36.8 C)     TempSrc: Axillary     SpO2: 98% 97%  (!) 51%  Weight:   90.2 kg  Height:       Exam Gen alert, no distress, chronically ill appearing elderly male No rash, cyanosis or gangrene Sclera anicteric, throat clear  No jvd or bruits Chest clear bilat to bases, no rales/ wheezing RRR no RG Abd soft ntnd no mass or ascites +bs GU trap bag draining into foley bag, clear urine MS no joint effusions or deformity Ext diffuse 1-2 LE > UE pitting edema, no wounds or ulcers Neuro is alert, nf     Home meds include - allopurinol, lisniopril 5 qd, reglan, MVI, elavil, gabapentin, prednisone, prns/ vits/ supps    Na 128  K 5.7   CO219  AG 11  BUN 60   Creat 2.38  alb 2.0   AST 65, ALT 37 , Tbili 2.3, total prot 4.7   Ca 9.0   WBC 7K   Hb 6.6- 7.7     UA turbid, 100 prot, few bact, 6-10 rbc/ 21-50 wbc, 0-5 epis    UNa < 10, UCr 130      Renal US - 10.4/ 10.8 cm kidneys w/o hydro, normal echo    CXR - clear lungs, bilat LAN   Assessment/ Plan: AKI - b/l creatinine 0.6- 0.9 here on admission. Creat rose up to 1.3 on 6/10 and 2.3 today 6/11.  UA shows ^wbc, min rbc's/ protein. Renal US shows no obstruction. CXR from yest shows clear lungs. He rec'd IV contrast on 6/08, no  labs were done the next day and on 6/10 creatinine was noted to be up. Also on 6/08- 6/09 in the evening he did have a few low BP's in the 80s-90s. Urine lytes look pre-renal. ECHO done here showed normal LVEF. Suspect contrast nephropathy and/or intravasc vol depletion/ hypotension as cause of AKI. If this were contrast nephropathy w/ tubular injury would not expect urine Na to be so low. So intravasc vol depletion + hypotension is another possibility, related to low albumin and capillary leak. Main treatment w/ contrast is supportive care, and for intravasc vol depletion we would give IVF's +/- albumin. So would recommend cont IVF's as you're doing. Consider IV albumin again. Thanks for the consult , we will follow. Have d/w pmd.  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   Inpatient medications:  sodium chloride   Intravenous Once   [START ON 08/07/2022] acetaminophen  650 mg Per Tube Once   allopurinol  100 mg Per Tube BID   amiodarone  400 mg Per NG  tube BID   Followed by   Melene Muller ON 08/10/2022] amiodarone  200 mg Per Tube Daily   Chlorhexidine Gluconate Cloth  6 each Topical Daily   [START ON 08/07/2022] diphenhydrAMINE  50 mg Intravenous Once   feeding supplement (KATE FARMS STANDARD 1.4)  325 mL Oral BID BM   fentaNYL  1 patch Transdermal Q72H   heparin injection (subcutaneous)  5,000 Units  Subcutaneous Q8H   [START ON 08/07/2022] methylPREDNISolone (SOLU-MEDROL) injection  80 mg Intravenous Daily   metoprolol tartrate  25 mg Per NG tube BID   multivitamin with minerals  1 tablet Per Tube Daily   polyethylene glycol  17 g Oral Daily   QUEtiapine  25 mg Per NG tube QHS   senna  2 tablet Per NG tube Daily   sodium chloride flush  10-40 mL Intracatheter Q12H    promethazine (PHENERGAN) injection (IM or IVPB) Stopped (08/05/22 1747)   sodium bicarbonate 150 mEq in sterile water 1,150 mL infusion 75 mL/hr at 08/06/22 1819   chlorproMAZINE, Cold Pack, docusate sodium, Hot Pack, HYDROmorphone (DILAUDID) injection, ipratropium-albuterol, lip balm, metoprolol tartrate, [START ON 08/08/2022] ondansetron **OR** [START ON 08/08/2022] ondansetron (ZOFRAN) IV, mouth rinse, promethazine (PHENERGAN) injection (IM or IVPB), sodium chloride, sodium chloride flush

## 2022-08-06 NOTE — Progress Notes (Signed)
Tyler [name]  HEMATOLOGY/ONCOLOGY INPATIENT PROGRESS NOTE  Date of Service: 08/06/2022  Inpatient Attending: .Meredeth Ide, MD  SUBJECTIVE  Patient was seen in medical oncology follow-up.  Still having fairly tight abdomen and NG tube in place.  Received his mini CHOP today with plan for Rituxan tomorrow. His renal function has gotten worse with a urine sodium of less than 20 with concern for dehydration plus possible contrast-induced nephropathy.  Dr. Arlean Hopping.  Patient has been started on renally adjusted allopurinol for tumor lysis syndrome prophylaxis.  If uric acid levels remain high or go up it could be secondary to his decreased GFR but patient is also high risk for tumor lysis syndrome and might need rasburicase. No overt shortness of breath at this time.  No other new nausea or vomiting.  NG tube continues to remain in situ.  OBJECTIVE:  NAD  PHYSICAL EXAMINATION: . Vitals:   08/06/22 0618 08/06/22 0800 08/06/22 0900 08/06/22 1200  BP:   (!) 111/53   Pulse:   92   Resp:   18   Temp:  98 F (36.7 C) 98.4 F (36.9 C) 98.1 F (36.7 C)  TempSrc:  Oral Oral Oral  SpO2: 95%  92%   Weight:      Height:       Filed Weights   07/30/22 0000 08/01/22 0108 08/05/22 1028  Weight: 194 lb 0.1 oz (88 kg) 195 lb 15.8 oz (88.9 kg) 197 lb 12 oz (89.7 kg)   .Body mass index is 30.97 kg/m.  GENERAL:alert, OROPHARYNX:MMM NG tube in situ NECK: supple, no JVD LUNGS: clear to auscultation with normal respiratory effort, mildly increased work of breathing. HEART: irreg ABDOMEN: Abdomen still somewhat distended, improved status post NG tube placement. PSYCH alert somewhat slow to respond but does respond appropriately NEURO: no focal motor/sensory deficits  MEDICAL HISTORY:  Past Medical History:  Diagnosis Date   Cancer [facility])    prostate   Hypertension     SURGICAL HISTORY: Past Surgical History:  Procedure Laterality Date   LYMPHADENECTOMY Bilateral 09/01/2019   Procedure:  LYMPHADENECTOMY;  Surgeon: Sebastian Ache, MD;  Location: [facility];  Service: Urology;  Laterality: Bilateral;   ROBOT ASSISTED LAPAROSCOPIC RADICAL PROSTATECTOMY N/A 09/01/2019   Procedure: XI ROBOTIC ASSISTED LAPAROSCOPIC RADICAL PROSTATECTOMY;  Surgeon: Sebastian Ache, MD;  Location: [facility];  Service: Urology;  Laterality: N/A;  3 HRS    SOCIAL HISTORY: Social History   Socioeconomic History   Marital status: Married    Spouse name: Not on file   Number of children: Not on file   Years of education: Not on file   Highest education level: Not on file  Occupational History   Not on file  Tobacco Use   Smoking status: Never   Smokeless tobacco: Never  Vaping Use   Vaping Use: Never used  Substance and Sexual Activity   Alcohol use: Yes    Comment: at least one 40 oz beer daily   Drug use: Never   Sexual activity: Not on file  Other Topics Concern   Not on file  Social History Narrative   Not on file   Social Determinants of Health   Financial Resource Strain: Not on file  Food Insecurity: No Food Insecurity (07/30/2022)   Hunger Vital Sign    Worried About Running Out of Food in the Last Year: Never true    Ran Out of Food in the Last Year: Never true  Transportation Needs: No Transportation Needs (07/30/2022)  PRAPARE - Administrator, Civil Service (Medical): No    Lack of Transportation (Non-Medical): No  Physical Activity: Not on file  Stress: Not on file  Social Connections: Not on file  Intimate Partner Violence: Not At Risk (07/30/2022)   Humiliation, Afraid, Rape, and Kick questionnaire    Fear of Current or Ex-Partner: No    Emotionally Abused: No    Physically Abused: No    Sexually Abused: No    FAMILY HISTORY: Family History  Problem Relation Age of Onset   Stroke Father     ALLERGIES:  is allergic to amitriptyline, amlodipine, and neurontin [gabapentin].  MEDICATIONS:  Scheduled Meds:  sodium chloride   Intravenous Once   [START ON  08/07/2022] acetaminophen  650 mg Per Tube Once   allopurinol  100 mg Per Tube BID   amiodarone  400 mg Per NG tube BID   Followed by   Melene Muller ON 08/10/2022] amiodarone  200 mg Per Tube Daily   Chlorhexidine Gluconate Cloth  6 each Topical Daily   [START ON 08/07/2022] diphenhydrAMINE  50 mg Intravenous Once   feeding supplement (KATE FARMS STANDARD 1.4)  325 mL Oral BID BM   fentaNYL  1 patch Transdermal Q72H   heparin injection (subcutaneous)  5,000 Units Subcutaneous Q8H   [START ON 08/07/2022] methylPREDNISolone (SOLU-MEDROL) injection  80 mg Intravenous Daily   metoprolol tartrate  25 mg Per NG tube BID   multivitamin with minerals  1 tablet Per Tube Daily   polyethylene glycol  17 g Oral Daily   QUEtiapine  25 mg Per NG tube QHS   senna  2 tablet Per NG tube Daily   sodium chloride flush  10-40 mL Intracatheter Q12H   Continuous Infusions:  lactated ringers Stopped (08/06/22 1311)   promethazine (PHENERGAN) injection (IM or IVPB) Stopped (08/05/22 1747)   PRN Meds:.chlorproMAZINE, Cold Pack, docusate sodium, Hot Pack, HYDROmorphone (DILAUDID) injection, ipratropium-albuterol, lip balm, metoprolol tartrate, [START ON 08/08/2022] ondansetron **OR** [START ON 08/08/2022] ondansetron (ZOFRAN) IV, mouth rinse, promethazine (PHENERGAN) injection (IM or IVPB), sodium chloride, sodium chloride flush  REVIEW OF SYSTEMS:    10 Point review of Systems was done is negative except as noted above.   LABORATORY DATA:  I have reviewed the data as listed  .Tyler [name]    Latest Ref Rng & Units 08/06/2022   11:07 AM 08/06/2022    4:21 AM  CBC  WBC 4.0 - 10.5 K/uL  7.7   Hemoglobin 13.0 - 17.0 g/dL 7.7  6.3   Hematocrit 08.6 - 52.0 % 24.3  20.4   Platelets 150 - 400 K/uL  83        Latest Ref Rng & Units 08/06/2022    4:21 AM 08/05/2022    6:17 AM  CMP  Glucose 70 - 99 mg/dL 88  578   BUN 8 - 23 mg/dL 60  43   Creatinine 4.69 - 1.24 mg/dL 6.29  5.28   Sodium 413 - 145 mmol/L 128  126   Potassium  3.5 - 5.1 mmol/L 5.7  5.5   Chloride 98 - 111 mmol/L 98  98   CO2 22 - 32 mmol/L 19  18   Calcium 8.9 - 10.3 mg/dL 9.0  8.8   Total Protein 6.5 - 8.1 g/dL 4.7  5.0   Total Bilirubin 0.3 - 1.2 mg/dL 2.3  1.9   Alkaline Phos 38 - 126 U/L 96  94   AST 15 - 41 U/L 65  76   ALT 0 - 44 U/L 37  38       RADIOGRAPHIC STUDIES: I have personally reviewed the radiological images as listed and agreed with the findings in the report. DG Abd 1 View  Result Date: 08/05/2022 CLINICAL DATA:  Nasogastric tube placement. EXAM: ABDOMEN - 1 VIEW COMPARISON:  August 05, 2022 FINDINGS: A nasogastric tube is seen with its distal tip overlying the expected region of the gastric antrum. A small amount of radiopaque contrast is seen overlying the expected region of the gastric fundus. The bowel gas pattern is normal. No radio-opaque calculi are seen. IMPRESSION: Nasogastric tube positioning, as described above. Electronically Signed   By: Aram Candela M.D.   On: 08/05/2022 19:05   DG Abd 1 View  Result Date: 08/05/2022 CLINICAL DATA:  [id number] Vomiting [id number] EXAM: ABDOMEN - 1 VIEW COMPARISON:  08/01/2022 FINDINGS: The bowel gas pattern is normal. residual oral contrastIn the gastric fundus. No radio-opaque calculi or other significant radiographic abnormality are seen. IMPRESSION: Negative. Electronically Signed   By: Corlis Leak M.D.   On: 08/05/2022 17:02   DG Chest Port 1V same Day  Result Date: 08/05/2022 CLINICAL DATA:  Dyspnea EXAM: PORTABLE CHEST 1 VIEW COMPARISON:  08/03/2022 FINDINGS: Cardiac shadow is within normal limits. Persistent widening of the superior mediastinum is noted related to lymphadenopathy. Lungs are clear bilaterally. No bony abnormality is noted. IMPRESSION: Persistent lymphadenopathy with mediastinal widening. No other focal abnormality is seen. Electronically Signed   By: Alcide Clever M.D.   On: 08/05/2022 11:11   [place] EKG SITE RITE  Result Date: 08/05/2022 If Site Rite image not  attached, placement could not be confirmed due to current cardiac rhythm.  CT CHEST W CONTRAST  Result Date: 08/03/2022 CLINICAL DATA:  Inpatient. Abnormal chest radiograph. Chest wall mass. * Tracking Code: BO * EXAM: CT CHEST WITH CONTRAST TECHNIQUE: Multidetector CT imaging of the chest was performed during intravenous contrast administration. RADIATION DOSE REDUCTION: This exam was performed according to the departmental dose-optimization program which includes automated exposure control, adjustment of the mA and/or kV according to patient size and/or use of iterative reconstruction technique. CONTRAST:  75mL OMNIPAQUE IOHEXOL 300 MG/ML  SOLN COMPARISON:  Chest radiograph from one day prior. 07/07/2022 chest CT. FINDINGS: Cardiovascular: Normal heart size. No significant pericardial effusion/thickening. Left anterior descending coronary atherosclerosis. Great vessels are normal in course and caliber. No central pulmonary emboli. Mediastinum/Nodes: No significant thyroid nodules. Unremarkable esophagus. Bulky bilateral axillary adenopathy, increased from 07/07/2022. Representative short axis diameter 3.8 cm right axillary node (series 3/image 57), previously 2.7 cm. Bilateral bulky lower neck adenopathy is increased. Representative left lower neck 3.0 cm node (series 3/image 6), previously 1.8 cm. Bulky bilateral mediastinal adenopathy in the prevascular, AP window, right paratracheal and subcarinal chains, increased. Representative 3.0 cm right paratracheal node (series 3/image 53), previously 1.8 cm. Increased bilateral hilar adenopathy. Representative 2.0 cm left hilar node (series 3/image 77), previously 1.2 cm. Lungs/Pleura: No pneumothorax. Small dependent bilateral pleural effusions with associated compressive mild-to-moderate atelectasis in the posterior lower lobes bilaterally, new. No lung masses or significant pulmonary nodules in the aerated portions of the lungs. Upper abdomen: Multiple splenic  masses, for example 3.0 cm medially (series 3/image 131), not previously discretely visualized. Bulky upper abdominal lymphadenopathy in the gastrohepatic ligament, porta hepatis, portacaval, aortocaval and left para-aortic chains, increased. Representative 2.1 cm gastrohepatic ligament node (series 3/image 130), previously 1.8 cm. Representative 2.0 cm left para-aortic node (series 3/image 155), previously 1.3 cm.  Interval growth of multiple upper omental soft tissue nodules, largest 3.5 x 2.2 cm (series 3/image 153), previously 2.0 x 1.2 cm. Musculoskeletal: No aggressive appearing focal osseous lesions. Moderate thoracic spondylosis. IMPRESSION: 1. Rapid interval progression of bulky bilateral axillary, bilateral hilar, bilateral mediastinal, bilateral lower neck and upper abdominal lymphadenopathy since recent 07/07/2022 chest CT. Multiple new splenic masses. Enlarging upper omental soft tissue nodules. 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 The axillary adenopathy should be amenable to percutaneous biopsy. 2. Small dependent bilateral pleural effusions with associated compressive mild-to-moderate posterior lower lobe atelectasis, new. 3. One vessel coronary atherosclerosis. Electronically Signed   By: Delbert Phenix M.D.   On: 08/03/2022 12:02   DG Chest Port 1V same Day  Result Date: 08/02/2022 CLINICAL DATA:  Dyspnea shortness of breath EXAM: PORTABLE CHEST 1 VIEW COMPARISON:  Multiple exams, including 07/31/2022 FINDINGS: Substantially widened mediastinum at 14.9 cm presumably due to adenopathy given findings on prior imaging workups. This is stable compared to 07/31/2022 and substantially increased from 05/23/2022, and questionably increased from 07/06/2022 (AP projection on today's exam may be magnify the mediastinum more than prior PA projections and CT imaging). Heart size within normal limits.  Hazy indistinct density at the left lung base possibly from atelectasis or pneumonia, minimally improved from previous. IMPRESSION: 1. Substantially widened mediastinum at 14.9 cm, presumably due to adenopathy given previous chest CT findings. 2. Hazy indistinct density at the left lung base possibly from atelectasis or pneumonia, minimally improved from previous. Electronically Signed   By: Gaylyn Rong M.D.   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 intake, tachycardia and episode of shaking and unresponsiveness then confused thereafter. EEG to evaluate for seizure Level of alertness: Awake AEDs during EEG study: None Technical aspects: This EEG study was done with scalp electrodes positioned according to the 10-20 International system of electrode placement. Electrical activity was reviewed with band pass filter of 1-70Hz , sensitivity of 7 uV/mm, display speed of 18mm/sec with a 60Hz  notched filter applied as appropriate. EEG data were recorded continuously and digitally stored.  Video monitoring was available and reviewed as appropriate. Description: The posterior dominant rhythm consists of 7.5 Hz activity of moderate voltage (25-35 uV) seen predominantly in posterior head regions, symmetric and reactive to eye opening and eye closing. EEG showed intermittent generalized sharply contoured high amplitude 2-3hz  delta slowing. Hyperventilation and photic stimulation were not performed.   ABNORMALITY - Intermittent slow, generalized IMPRESSION: This study is suggestive of mild diffuse encephalopathy, nonspecific etiology. No seizures or epileptiform discharges  were seen throughout the recording.  If suspicion for interictal activity remains a concern, a prolonged study can be considered. Charlsie Quest   DG Abd 1 View  Result Date: 08/01/2022 CLINICAL DATA:  Abdominal distension. EXAM: ABDOMEN - 1 VIEW COMPARISON:  CT AP 07/29/2022 FINDINGS: No dilated loops of large or small bowel identified. Stool noted within the colon. No abnormal abdominal or pelvic calcifications. Visualized osseous structures appear intact. IMPRESSION: Nonobstructive bowel gas pattern. Electronically Signed   By: Signa Kell M.D.   On: 08/01/2022 12:41  DG Chest Port 1 View  Result Date: 07/31/2022 CLINICAL DATA:  Possible aspiration EXAM: PORTABLE CHEST 1 VIEW COMPARISON:  07/29/2022, 07/07/2022 FINDINGS: Cardiac shadow is stable. Widened mediastinum is again identified accentuated by the portable technique. This is consistent with the known mediastinal adenopathy seen on prior CT. Lungs are well aerated bilaterally. Mild bibasilar atelectasis is seen. No sizable effusion is noted. No bony abnormality is seen. IMPRESSION: Widened mediastinum consistent with the known mediastinal adenopathy. Mild bibasilar atelectasis. Electronically Signed   By: Alcide Clever M.D.   On: 07/31/2022 23:06   CT MAXILLOFACIAL WO CONTRAST  Result Date: 07/30/2022 CLINICAL DATA:  Right facial pain EXAM: CT MAXILLOFACIAL WITHOUT CONTRAST TECHNIQUE: Multidetector CT imaging of the maxillofacial structures was performed. Multiplanar CT image reconstructions were also generated. RADIATION DOSE REDUCTION: This exam was performed according to the departmental dose-optimization program which includes automated exposure control, adjustment of the mA and/or kV according to patient size and/or use of iterative reconstruction technique. COMPARISON:  None Available. FINDINGS: Osseous: No fracture or mandibular dislocation. No destructive process. Large periapical lucency at the root of the most posterior remaining  right maxillary 2. Orbits: Negative. No traumatic or inflammatory finding. Sinuses: Right anterior ethmoid and maxillary sinus complete opacification with polypoid contours. Soft tissues: Multiple enlarged submandibular lymph nodes, measuring up to 1.6 cm. No abscess or fluid collection. Limited intracranial: No significant or unexpected finding. IMPRESSION: 1. Large periapical lucency at the root of the most posterior remaining right maxillary tooth. No associated abscess. 2. Right anterior ethmoid and maxillary sinus complete opacification with polypoid contours, possibly antrochoanal polyp. 3. Multiple enlarged submandibular lymph nodes, measuring up to 1.6 cm, likely reactive. Electronically Signed   By: Deatra Robinson M.D.   On: 07/30/2022 20:38   MR BRAIN W WO CONTRAST  Result Date: 07/30/2022 CLINICAL DATA:  Seizure and headache EXAM: MRI HEAD WITHOUT AND WITH CONTRAST TECHNIQUE: Multiplanar, multiecho pulse sequences of the brain and surrounding structures were obtained without and with intravenous contrast. CONTRAST:  9mL GADAVIST GADOBUTROL 1 MMOL/ML IV SOLN COMPARISON:  None Available. FINDINGS: Brain: No acute infarct, mass effect or extra-axial collection. No acute or chronic hemorrhage. There is multifocal hyperintense T2-weighted signal within the white matter. Parenchymal volume and CSF spaces are normal. The midline structures are normal. There is no abnormal contrast enhancement. Vascular: Major flow voids are preserved. Skull and upper cervical spine: Normal calvarium and skull base. Visualized upper cervical spine and soft tissues are normal. Sinuses/Orbits:Opacification of the right maxillary and right anterior ethmoid sinuses. Normal orbits. IMPRESSION: 1. No acute intracranial abnormality. 2. Multifocal hyperintense T2-weighted signal within the white matter, most consistent with chronic small vessel ischemia. 3. Polypoid lesion of the right maxillary and ethmoid sinuses, possibly  antrochoanal polyp. Nonemergent ENT consultation might be helpful. Electronically Signed   By: Deatra Robinson M.D.   On: 07/30/2022 20:34   CT ABDOMEN PELVIS WO CONTRAST  Result Date: 07/29/2022 CLINICAL DATA:  Acute abdominal pain. EXAM: CT ABDOMEN AND PELVIS WITHOUT CONTRAST TECHNIQUE: Multidetector CT imaging of the abdomen and pelvis was performed following the standard protocol without IV contrast. RADIATION DOSE REDUCTION: This exam was performed according to the departmental dose-optimization program which includes automated exposure control, adjustment of the mA and/or kV according to patient size and/or use of iterative reconstruction technique. COMPARISON:  CT examination dated Jul 15, 2022 FINDINGS: Lower chest: Bibasilar subsegmental atelectasis. Hepatobiliary: No focal liver abnormality is seen. No gallstones, gallbladder wall thickening, or biliary dilatation. Pancreas: Mild generalized  pancreatic atrophy. No pancreatic ductal dilatation or surrounding inflammatory changes. Spleen: Normal in size without focal abnormality. Adrenals/Urinary Tract: Adrenal glands are unremarkable. Kidneys are normal, without renal calculi, focal lesion, or hydronephrosis. Bilateral exophytic renal cysts, likely simple cysts. Bladder is unremarkable. Stomach/Bowel: Stomach is within normal limits. Appendix appears normal. Scattered colonic diverticulosis without evidence of acute diverticulitis. No evidence of bowel wall thickening, distention, or inflammatory changes. Vascular/Lymphatic: No significant vascular findings are present. There numerous lymph nodes of multiple sizes which are para-aortic, mesenteric, porta hepatis as well as multiple lymph nodes about the spleen as well as few lymph nodes in the pelvis. These are not significantly changed since prior examination. Reproductive: Postsurgical changes in the prostate bed. Other: Scattered soft tissue densities in the anterior abdominal wall, which may represent  lymph nodes. No abdominopelvic ascites. Musculoskeletal: Compression fracture of the L4 vertebral body is unchanged. No acute osseous abnormality. IMPRESSION: 1. No CT evidence of acute abdominal/pelvic process. 2. Scattered colonic diverticulosis without evidence of acute diverticulitis. 3. Numerous lymph nodes of multiple sizes are present throughout the abdomen and pelvis predominantly in the mesentery and para-aortic region, which are not significantly changed since prior examination. 4. Chronic compression fracture of the L4 vertebral body. Electronically Signed   By: Larose Hires D.O.   On: 07/29/2022 19:26   CT Head Wo Contrast  Result Date: 07/29/2022 CLINICAL DATA:  Seizures, new onset. EXAM: CT HEAD WITHOUT CONTRAST TECHNIQUE: Contiguous axial images were obtained from the base of the skull through the vertex without intravenous contrast. RADIATION DOSE REDUCTION: This exam was performed according to the departmental dose-optimization program which includes automated exposure control, adjustment of the mA and/or kV according to patient size and/or use of iterative reconstruction technique. COMPARISON:  None Available. FINDINGS: Brain: No evidence of acute infarction, hemorrhage, hydrocephalus, extra-axial collection or mass lesion/mass effect. Vascular: No hyperdense vessel or unexpected calcification. Skull: Normal. Negative for fracture or focal lesion. Sinuses/Orbits: Mucosal thickening of the ethmoid air cells. Other: None. IMPRESSION: 1. No acute intracranial abnormality. 2. Mucosal thickening of the ethmoid air cells. Electronically Signed   By: Larose Hires D.O.   On: 07/29/2022 19:15   ECHOCARDIOGRAM COMPLETE  Result Date: 07/29/2022    ECHOCARDIOGRAM REPORT   Patient Name:   Tyler [name] Date of Exam: 07/29/2022 Medical Rec #:  [id number]    Height:       67.0 in Accession #:    [id number]   Weight:       185.9 lb Date of Birth:  April 23, 1950    BSA:          1.960 m Patient Age:    72 years      BP:           111/73 mmHg Patient Gender: M            HR:           117 bpm. Exam Location:  Outpatient Procedure: 2D Echo, Color Doppler and Cardiac Doppler Indications:    Chemo  History:        Patient has prior history of Echocardiogram examinations, most                 recent 12/16/2021. Risk Factors:Hypertension. Prostate Cancer,                 Lymphoma, Chemotherapy.  Sonographer:    Milbert Coulter Referring Phys: Johney Maine  Sonographer Comments: Image acquisition challenging due to respiratory motion and Tachycardia.  IMPRESSIONS  1. Intracavitary gradient is realted to dynamic function. Left ventricular ejection fraction, by estimation, is >75%. The left ventricle has hyperdynamic function. The left ventricle has no regional wall motion abnormalities. There is mild concentric left ventricular hypertrophy. Left ventricular diastolic parameters were normal.  2. Right ventricular systolic function is hyperdynamic. The right ventricular size is normal. Tricuspid regurgitation signal is inadequate for assessing PA pressure.  3. The mitral valve is normal in structure. No evidence of mitral valve regurgitation.  4. The aortic valve is tricuspid. There is mild calcification of the aortic valve. Aortic valve regurgitation is not visualized. No aortic stenosis is present. Comparison(s): No significant change from prior study. FINDINGS  Left Ventricle: Intracavitary gradient is realted to dynamic function. Left ventricular ejection fraction, by estimation, is >75%. The left ventricle has hyperdynamic function. The left ventricle has no regional wall motion abnormalities. The left ventricular internal cavity size was small. There is mild concentric left ventricular hypertrophy. Left ventricular diastolic parameters were normal. Right Ventricle: The right ventricular size is normal. No increase in right ventricular wall thickness. Right ventricular systolic function is hyperdynamic. Tricuspid regurgitation  signal is inadequate for assessing PA pressure. Left Atrium: Left atrial size was normal in size. Right Atrium: Right atrial size was normal in size. Pericardium: Trivial pericardial effusion is present. The pericardial effusion is posterior to the left ventricle. Mitral Valve: The mitral valve is normal in structure. No evidence of mitral valve regurgitation. Tricuspid Valve: The tricuspid valve is normal in structure. Tricuspid valve regurgitation is not demonstrated. Aortic Valve: The aortic valve is tricuspid. There is mild calcification of the aortic valve. Aortic valve regurgitation is not visualized. No aortic stenosis is present. Aortic valve mean gradient measures 10.0 mmHg. Aortic valve peak gradient measures 18.5 mmHg. Aortic valve area, by VTI measures 2.53 cm. Pulmonic Valve: The pulmonic valve was not well visualized. Pulmonic valve regurgitation is not visualized. Aorta: The aortic root and ascending aorta are structurally normal, with no evidence of dilitation. IAS/Shunts: The interatrial septum was not well visualized.  LEFT VENTRICLE PLAX 2D LVIDd:         4.00 cm     Diastology LVIDs:         1.80 cm     LV e' medial:    8.24 cm/s LV PW:         1.10 cm     LV E/e' medial:  7.5 LV IVS:        1.20 cm     LV e' lateral:   11.70 cm/s LVOT diam:     2.10 cm     LV E/e' lateral: 5.3 LV SV:         72 LV SV Index:   37 LVOT Area:     3.46 cm  LV Volumes (MOD) LV vol d, MOD A2C: 27.2 ml LV vol d, MOD A4C: 49.7 ml LV vol s, MOD A2C: 7.1 ml LV vol s, MOD A4C: 13.6 ml LV SV MOD A2C:     20.1 ml LV SV MOD A4C:     49.7 ml LV SV MOD BP:      26.5 ml RIGHT VENTRICLE TAPSE (M-mode): 2.0 cm LEFT ATRIUM             Index LA diam:        3.50 cm 1.79 cm/m LA Vol (A2C):   26.6 ml 13.57 ml/m LA Vol (A4C):   36.9 ml 18.82 ml/m LA Biplane Vol: 31.3 ml  15.97 ml/m  AORTIC VALVE AV Area (Vmax):    2.77 cm AV Area (Vmean):   2.66 cm AV Area (VTI):     2.53 cm AV Vmax:           215.00 cm/s AV Vmean:           146.000 cm/s AV VTI:            0.285 m AV Peak Grad:      18.5 mmHg AV Mean Grad:      10.0 mmHg LVOT Vmax:         172.00 cm/s LVOT Vmean:        112.000 cm/s LVOT VTI:          0.208 m LVOT/AV VTI ratio: 0.73  AORTA Ao Asc diam: 2.90 cm MITRAL VALVE MV Area (PHT): 4.49 cm    SHUNTS MV Decel Time: 169 msec    Systemic VTI:  0.21 m MV E velocity: 61.90 cm/s  Systemic Diam: 2.10 cm MV A velocity: 89.20 cm/s MV E/A ratio:  0.69 Riley Lam MD Electronically signed by Riley Lam MD Signature Date/Time: 07/29/2022/4:01:01 PM    Final    DG Chest Port 1 View  Result Date: 07/29/2022 CLINICAL DATA:  Cough.  Syncope. EXAM: PORTABLE CHEST 1 VIEW COMPARISON:  May 23, 2022.  Jul 07, 2022. FINDINGS: Prominence of the superior mediastinum is noted consistent with adenopathy as noted on prior CT scan. Stable cardiac size. Lungs are clear. Bony thorax is unremarkable. IMPRESSION: Widening of superior mediastinum consistent with adenopathy noted on prior CT scan of Jul 07, 2022. This is concerning for lymphoma or metastatic disease. Electronically Signed   By: Lupita Raider M.D.   On: 07/29/2022 15:34   [place] CORE BIOPSY (LYMPH NODES)  Result Date: 07/16/2022 INDICATION: 72 year old male referred for biopsy of axillary lymph node EXAM: ULTRASOUND-GUIDED BIOPSY AXILLARY LYMPH NODE MEDICATIONS: None. ANESTHESIA/SEDATION: None COMPLICATIONS: None PROCEDURE: Informed written consent was obtained from the patient with interpreter after a thorough discussion of the procedural risks, benefits and alternatives. All questions were addressed. Maximal Sterile Barrier Technique was utilized including caps, mask, sterile gowns, sterile gloves, sterile drape, hand hygiene and skin antiseptic. A timeout was performed prior to the initiation of the procedure. Ultrasound survey was performed with images stored and sent to PACs. The left axillary region was prepped with chlorhexidine in a sterile fashion, and a sterile  drape was applied covering the operative field. A sterile gown and sterile gloves were used for the procedure. Local anesthesia was provided with 1% Lidocaine. Ultrasound guidance was used to infiltrate the region with 1% lidocaine for local anesthesia. Four separate 16 gauge core needle biopsy were then acquired of the left axillary lymph node using ultrasound guidance. Images were stored. Final image was stored after biopsy. Patient tolerated the procedure well and remained hemodynamically stable throughout. No complications were encountered and no significant blood loss was encounter IMPRESSION: Status post ultrasound-guided biopsy of left axillary lymph node Signed, Yvone Neu. Miachel Roux, RPVI [facility] Electronically Signed   By: Gilmer Mor D.O.   On: 07/16/2022 17:07   CT ABDOMEN PELVIS W CONTRAST  Result Date: 07/15/2022 CLINICAL DATA:  Possible lymphoma. Bowel obstruction suspected. Right lower quadrant and right upper quadrant abdominal pain for 6 weeks. Nausea and vomiting. Scheduled for a lymph node biopsy tomorrow. EXAM: CT ABDOMEN AND PELVIS WITH CONTRAST TECHNIQUE: Multidetector CT imaging of the abdomen and pelvis was  performed using the standard protocol following bolus administration of intravenous contrast. RADIATION DOSE REDUCTION: This exam was performed according to the departmental dose-optimization program which includes automated exposure control, adjustment of the mA and/or kV according to patient size and/or use of iterative reconstruction technique. CONTRAST:  75mL OMNIPAQUE IOHEXOL 350 MG/ML SOLN COMPARISON:  CT abdomen and pelvis 06/29/2022 FINDINGS: Lower chest: Mild curvilinear subsegmental atelectasis and/or scarring within the bilateral lung bases is similar to prior. Heart size is at the upper limits of normal. Coronary artery calcifications are noted. Hepatobiliary: Smooth liver contours. There are again  punctate subcentimeter low-density lesions within the right hepatic lobe (axial series 3, image 25) and the left hepatic lobe (axial image 23) that are again too small to further characterize but not significantly changed from recent prior. The gallbladder is unremarkable. No intrahepatic or extrahepatic biliary ductal dilatation. Pancreas: Unremarkable. No pancreatic ductal dilatation or surrounding inflammatory changes. Spleen: The spleen measures 11 cm in length, within normal limits. There is a 2.9 x 2.6 x 2.6 cm low-density lesion within the medial aspect of the spleen that is unchanged from 07/07/2022 recent CT but new from 12/10/2021. Adrenals/Urinary Tract: Normal bilateral adrenals. The kidneys enhance uniformly and are symmetric in size. There are again bilateral fluid attenuation renal cysts, measuring up to 3.8 cm on the right and 2.8 cm on the left. No follow-up imaging is recommended. The urinary bladder is decompressed, limiting evaluation. Stomach/Bowel: Moderate amount of stool throughout the colon. Normal appearance of the terminal ileum. Normal appendix (axial series 3, image 60) no dilated loops of bowel are seen to indicate bowel obstruction. Vascular/Lymphatic: No abdominal aortic aneurysm. The major intra-abdominal aortic branch vessels are patent. No significant change in mesenteric, porta hepatis, retroperitoneal, and pelvic lymphadenopathy compared to 07/07/2022, again new from 12/10/2021. Reference gastrohepatic ligament 2.0 cm short axis lymph node (axial series 2, image 21), lymph node at the anterior aspect of the splenic hilum measuring up to 2.0 cm in short axis (axial image 25), and conglomeration of aortocaval and para-aortic lymph nodes with a focal conglomeration measuring up to 2.8 cm in short axis (axial image 42), not significantly changed from 07/07/2022. Reproductive: The prostate again appears to be surgically absent. Other: No ventral abdominal hernia is seen. No free air  or free fluid is seen within the abdomen or pelvis. Musculoskeletal: Chronic inferior L4 endplate mild concavity degenerative Schmorl's node is similar to prior. Anterior bridging osteophytes are again seen throughout the visualized inferior thoracic spine. There is complete anterior osseous fusion of the left sacroiliac joint. Moderate anterior right sacroiliac joint degenerative osteophytes. IMPRESSION: Compared to 07/07/2022: 1. No bowel obstruction.  Normal appendix. 2. No significant change in mesenteric, porta hepatis, retroperitoneal, and pelvic lymphadenopathy compared to 07/07/2022, again new from 12/10/2021. 3. No significant change in 2.9 cm low-density lesion within the medial aspect of the spleen compared to 07/07/2022 but new from 12/10/2021. 4. Constellation of findings are again concerning for lymphoma versus metastatic disease. Electronically Signed   By: Neita Garnet M.D.   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  # 7 Anemia and mild thrombocytopenia -- likely multifactorial -- lymphoma + MDS+ >?sepsis r/o GI bleeding.  #8 Abdominal distention with partial obstruction versus ileus.  # 9 Hep B c AB +ve, Hep B SAg neg HepB SAb neg, HBV DNA PCR --neg.  #10 acute kidney injury with sodium level  less than 20 possibly related to prerenal causes versus contrast nephropathy versus high risk of tumor lysis syndrome.  PLAN -Labs done today reviewed and discussed with hospitalist. -Nephrology has been consulted for his acute kidney injury. -He is receiving IV fluids and now has been placed on bicarb drip to help with his kidney function.  Foley's catheter has been placed -Patient has received mini CHOP with a plan for Rituxan possibly tomorrow if labs stable. -Continue allopurinol for tumor lysis syndrome prophylaxis -High risk of significant tumor lysis and might need rasburicase -Continue transfusion support to maintain hemoglobin more than 8 and platelet count more than 20k -Monitor and manage electrolytes per hospital medicine. 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 -Did have a lot of diarrhea from use of Kayexalate for hyperkalemia. -If GI function does not improve we will need to consider possible TPN -Ongoing goals of care discussion -Appreciate excellent hospital medicine and nephrology cares. -rx care co-ordinated with nursing and pharmacist  .The total time spent in the appointment was 50 minutes* .  All of the patient's questions were answered with apparent satisfaction. The patient knows to call the clinic with any problems, questions or concerns.   Wyvonnia Lora MD MS AAHIVMS [facility] Hematology/Oncology Physician [facility]  .*Total Encounter Time as defined by the Centers for Medicare and Medicaid Services includes, in addition to the face-to-face time of a patient visit (documented in the note above) non-face-to-face time: obtaining and reviewing outside history, ordering and reviewing medications, tests or procedures, care coordination (communications with other health care professionals or caregivers) and documentation in the medical record.

## 2022-08-06 NOTE — Telephone Encounter (Signed)
Patient Advocate Encounter  Prior Authorization for Vemlidy 25MG  tablets has been approved.    PA# [id number] Key: [employer] Electronic PA Form       Roland Earl, CPhT Pharmacy Patient Advocate Specialist [facility] Patient Advocate Team Direct Number: [phone]  Fax: [phone]

## 2022-08-06 NOTE — Progress Notes (Signed)
Patient received chemo today for the first time. He received adriamycin, vincristine and cytoxan with no issues. Patient tolerated treatment well and is resting. No complications at this time

## 2022-08-06 NOTE — Progress Notes (Signed)
[name]  HEMATOLOGY/ONCOLOGY INPATIENT PROGRESS NOTE  Date of Service: 08/05/2022  Inpatient Attending: .Meredeth Ide, MD   SUBJECTIVE  Patient was seen in medical oncology follow-up with his wife at bedside and daughter Karena Addison on the phone.  He is having abdominal distention probably from increasing abdominal lymph nodes with some partial bowel obstruction like symptomatology for which she needed an NG tube today.  Abdominal x-ray did not show obvious complete obstruction.  He also has had some intermittent shortness of breath with increased work of breathing and has significant mediastinal adenopathy.  He got his PICC line placed today and we are intending to proceed with R mini CHOP starting tomorrow.  Will plan to do his first cycle of treatment as inpatient and then transition him to outpatient treatment. Patient and his daughter are aware that he is having significant decline in his medical condition given his aggressive lymphoma, limited p.o. intake and concern for failure to thrive. We discussed that he has a narrow window to benefit from his chemoimmunotherapy and we would have to challenge him with this. They are agreeable with proceeding with treatment.  OBJECTIVE:  NAD  PHYSICAL EXAMINATION: . Vitals:   08/06/22 0618 08/06/22 0800 08/06/22 0900 08/06/22 1200  BP:   (!) 111/53   Pulse:   92   Resp:   18   Temp:  98 F (36.7 C) 98.4 F (36.9 C) 98.1 F (36.7 C)  TempSrc:  Oral Oral Oral  SpO2: 95%  92%   Weight:      Height:       Filed Weights   07/30/22 0000 08/01/22 0108 08/05/22 1028  Weight: 194 lb 0.1 oz (88 kg) 195 lb 15.8 oz (88.9 kg) 197 lb 12 oz (89.7 kg)   .Body mass index is 30.97 kg/m.  GENERAL:alert, in no acute distress OROPHARYNX:MMM  NECK: supple, no JVD, thyroid normal size, non-tender, without nodularity LYMPH:  no palpable lymphadenopathy in the cervical, axillary or inguinal LUNGS: clear to auscultation with normal respiratory effort, mildly  increased work of breathing. HEART: irreg ABDOMEN: Abdomen distended and tympanic improved status post NG tube placement. PSYCH: alert & oriented x 3 with fluent speech NEURO: no focal motor/sensory deficits  MEDICAL HISTORY:  Past Medical History:  Diagnosis Date   Cancer [facility])    prostate   Hypertension     SURGICAL HISTORY: Past Surgical History:  Procedure Laterality Date   LYMPHADENECTOMY Bilateral 09/01/2019   Procedure: LYMPHADENECTOMY;  Surgeon: Sebastian Ache, MD;  Location: [facility];  Service: Urology;  Laterality: Bilateral;   ROBOT ASSISTED LAPAROSCOPIC RADICAL PROSTATECTOMY N/A 09/01/2019   Procedure: XI ROBOTIC ASSISTED LAPAROSCOPIC RADICAL PROSTATECTOMY;  Surgeon: Sebastian Ache, MD;  Location: [facility];  Service: Urology;  Laterality: N/A;  3 HRS    SOCIAL HISTORY: Social History   Socioeconomic History   Marital status: Married    Spouse name: Not on file   Number of children: Not on file   Years of education: Not on file   Highest education level: Not on file  Occupational History   Not on file  Tobacco Use   Smoking status: Never   Smokeless tobacco: Never  Vaping Use   Vaping Use: Never used  Substance and Sexual Activity   Alcohol use: Yes    Comment: at least one 40 oz beer daily   Drug use: Never   Sexual activity: Not on file  Other Topics Concern   Not on file  Social History Narrative  Not on file   Social Determinants of Health   Financial Resource Strain: Not on file  Food Insecurity: No Food Insecurity (07/30/2022)   Hunger Vital Sign    Worried About Running Out of Food in the Last Year: Never true    Ran Out of Food in the Last Year: Never true  Transportation Needs: No Transportation Needs (07/30/2022)   PRAPARE - Administrator, Civil Service (Medical): No    Lack of Transportation (Non-Medical): No  Physical Activity: Not on file  Stress: Not on file  Social Connections: Not on file  Intimate Partner Violence: Not  At Risk (07/30/2022)   Humiliation, Afraid, Rape, and Kick questionnaire    Fear of Current or Ex-Partner: No    Emotionally Abused: No    Physically Abused: No    Sexually Abused: No    FAMILY HISTORY: Family History  Problem Relation Age of Onset   Stroke Father     ALLERGIES:  is allergic to amitriptyline, amlodipine, and neurontin [gabapentin].  MEDICATIONS:  Scheduled Meds:  sodium chloride   Intravenous Once   [START ON 08/07/2022] acetaminophen  650 mg Per Tube Once   amiodarone  400 mg Per NG tube BID   Followed by   Melene Muller ON 08/10/2022] amiodarone  200 mg Per Tube Daily   Chlorhexidine Gluconate Cloth  6 each Topical Daily   [START ON 08/07/2022] diphenhydrAMINE  50 mg Intravenous Once   feeding supplement (KATE FARMS STANDARD 1.4)  325 mL Oral BID BM   fentaNYL  1 patch Transdermal Q72H   heparin injection (subcutaneous)  5,000 Units Subcutaneous Q8H   [START ON 08/07/2022] methylPREDNISolone (SOLU-MEDROL) injection  80 mg Intravenous Daily   metoprolol tartrate  25 mg Per NG tube BID   multivitamin with minerals  1 tablet Per Tube Daily   polyethylene glycol  17 g Oral Daily   QUEtiapine  25 mg Per NG tube QHS   senna  2 tablet Per NG tube Daily   sodium chloride flush  10-40 mL Intracatheter Q12H   Continuous Infusions:  lactated ringers Stopped (08/06/22 1311)   promethazine (PHENERGAN) injection (IM or IVPB) Stopped (08/05/22 1747)   PRN Meds:.chlorproMAZINE, Cold Pack, docusate sodium, Hot Pack, HYDROmorphone (DILAUDID) injection, ipratropium-albuterol, lip balm, metoprolol tartrate, [START ON 08/08/2022] ondansetron **OR** [START ON 08/08/2022] ondansetron (ZOFRAN) IV, mouth rinse, promethazine (PHENERGAN) injection (IM or IVPB), sodium chloride, sodium chloride flush  REVIEW OF SYSTEMS:    10 Point review of Systems was done is negative except as noted above.   LABORATORY DATA:  I have reviewed the data as listed  .    Latest Ref Rng & Units 08/05/2022     6:17 AM  CBC  WBC 4.0 - 10.5 K/uL 9.3   Hemoglobin 13.0 - 17.0 g/dL 7.6   Hematocrit 16.1 - 52.0 % 24.5   Platelets 150 - 400 K/uL 96     .    Latest Ref Rng & Units 08/05/2022    6:17 AM 08/03/2022    8:48 AM  CMP  Glucose 70 - 99 mg/dL 096  045   BUN 8 - 23 mg/dL 43  31   Creatinine 4.09 - 1.24 mg/dL 8.11  9.14   Sodium 782 - 145 mmol/L 126  126   Potassium 3.5 - 5.1 mmol/L 5.5  4.1   Chloride 98 - 111 mmol/L 98  96   CO2 22 - 32 mmol/L 18  19   Calcium 8.9 -  10.3 mg/dL 8.8  8.1   Total Protein 6.5 - 8.1 g/dL 5.0    Total Bilirubin 0.3 - 1.2 mg/dL 1.9    Alkaline Phos 38 - 126 U/L 94    AST 15 - 41 U/L 76    ALT 0 - 44 U/L 38       RADIOGRAPHIC STUDIES: I have personally reviewed the radiological images as listed and agreed with the findings in the report. DG Abd 1 View  Result Date: 08/05/2022 CLINICAL DATA:  Nasogastric tube placement. EXAM: ABDOMEN - 1 VIEW COMPARISON:  August 05, 2022 FINDINGS: A nasogastric tube is seen with its distal tip overlying the expected region of the gastric antrum. A small amount of radiopaque contrast is seen overlying the expected region of the gastric fundus. The bowel gas pattern is normal. No radio-opaque calculi are seen. IMPRESSION: Nasogastric tube positioning, as described above. Electronically Signed   By: Aram Candela M.D.   On: 08/05/2022 19:05   DG Abd 1 View  Result Date: 08/05/2022 CLINICAL DATA:  [id number] Vomiting [id number] EXAM: ABDOMEN - 1 VIEW COMPARISON:  08/01/2022 FINDINGS: The bowel gas pattern is normal. residual oral contrastIn the gastric fundus. No radio-opaque calculi or other significant radiographic abnormality are seen. IMPRESSION: Negative. Electronically Signed   By: Corlis Leak M.D.   On: 08/05/2022 17:02   DG Chest Port 1V same Day  Result Date: 08/05/2022 CLINICAL DATA:  Dyspnea EXAM: PORTABLE CHEST 1 VIEW COMPARISON:  08/03/2022 FINDINGS: Cardiac shadow is within normal limits. Persistent widening of the  superior mediastinum is noted related to lymphadenopathy. Lungs are clear bilaterally. No bony abnormality is noted. IMPRESSION: Persistent lymphadenopathy with mediastinal widening. No other focal abnormality is seen. Electronically Signed   By: Alcide Clever M.D.   On: 08/05/2022 11:11   [place] EKG SITE RITE  Result Date: 08/05/2022 If Site Rite image not attached, placement could not be confirmed due to current cardiac rhythm.  CT CHEST W CONTRAST  Result Date: 08/03/2022 CLINICAL DATA:  Inpatient. Abnormal chest radiograph. Chest wall mass. * Tracking Code: BO * EXAM: CT CHEST WITH CONTRAST TECHNIQUE: Multidetector CT imaging of the chest was performed during intravenous contrast administration. RADIATION DOSE REDUCTION: This exam was performed according to the departmental dose-optimization program which includes automated exposure control, adjustment of the mA and/or kV according to patient size and/or use of iterative reconstruction technique. CONTRAST:  75mL OMNIPAQUE IOHEXOL 300 MG/ML  SOLN COMPARISON:  Chest radiograph from one day prior. 07/07/2022 chest CT. FINDINGS: Cardiovascular: Normal heart size. No significant pericardial effusion/thickening. Left anterior descending coronary atherosclerosis. Great vessels are normal in course and caliber. No central pulmonary emboli. Mediastinum/Nodes: No significant thyroid nodules. Unremarkable esophagus. Bulky bilateral axillary adenopathy, increased from 07/07/2022. Representative short axis diameter 3.8 cm right axillary node (series 3/image 57), previously 2.7 cm. Bilateral bulky lower neck adenopathy is increased. Representative left lower neck 3.0 cm node (series 3/image 6), previously 1.8 cm. Bulky bilateral mediastinal adenopathy in the prevascular, AP window, right paratracheal and subcarinal chains, increased. Representative 3.0 cm right paratracheal node (series 3/image 53), previously 1.8 cm. Increased bilateral hilar adenopathy. Representative 2.0  cm left hilar node (series 3/image 77), previously 1.2 cm. Lungs/Pleura: No pneumothorax. Small dependent bilateral pleural effusions with associated compressive mild-to-moderate atelectasis in the posterior lower lobes bilaterally, new. No lung masses or significant pulmonary nodules in the aerated portions of the lungs. Upper abdomen: Multiple splenic masses, for example 3.0 cm medially (series 3/image 131), not previously discretely visualized.  Bulky upper abdominal lymphadenopathy in the gastrohepatic ligament, porta hepatis, portacaval, aortocaval and left para-aortic chains, increased. Representative 2.1 cm gastrohepatic ligament node (series 3/image 130), previously 1.8 cm. Representative 2.0 cm left para-aortic node (series 3/image 155), previously 1.3 cm. Interval growth of multiple upper omental soft tissue nodules, largest 3.5 x 2.2 cm (series 3/image 153), previously 2.0 x 1.2 cm. Musculoskeletal: No aggressive appearing focal osseous lesions. Moderate thoracic spondylosis. IMPRESSION: 1. Rapid interval progression of bulky bilateral axillary, bilateral hilar, bilateral mediastinal, bilateral lower neck and upper abdominal lymphadenopathy since recent 07/07/2022 chest CT. Multiple new splenic masses. Enlarging upper omental soft tissue nodules. 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 The axillary adenopathy should be amenable to percutaneous biopsy. 2. Small dependent bilateral pleural effusions with associated compressive mild-to-moderate posterior lower lobe atelectasis, new. 3. One vessel coronary atherosclerosis. Electronically Signed   By: Delbert Phenix M.D.   On: 08/03/2022 12:02   DG Chest Port 1V same Day  Result Date: 08/02/2022 CLINICAL DATA:  Dyspnea shortness of breath EXAM: PORTABLE CHEST 1 VIEW COMPARISON:  Multiple exams, including 07/31/2022 FINDINGS: Substantially widened  mediastinum at 14.9 cm presumably due to adenopathy given findings on prior imaging workups. This is stable compared to 07/31/2022 and substantially increased from 05/23/2022, and questionably increased from 07/06/2022 (AP projection on today's exam may be magnify the mediastinum more than prior PA projections and CT imaging). Heart size within normal limits. Hazy indistinct density at the left lung base possibly from atelectasis or pneumonia, minimally improved from previous. IMPRESSION: 1. Substantially widened mediastinum at 14.9 cm, presumably due to adenopathy given previous chest CT findings. 2. Hazy indistinct density at the left lung base possibly from atelectasis or pneumonia, minimally improved from previous. Electronically Signed   By: Gaylyn Rong M.D.   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 intake, tachycardia and episode of shaking and unresponsiveness then confused thereafter. EEG to evaluate for seizure Level of alertness: Awake AEDs during EEG study: None Technical aspects: This EEG study was done with scalp electrodes positioned according to the 10-20 International system of electrode placement. Electrical activity was reviewed with band pass filter of 1-70Hz , sensitivity of 7 uV/mm, display speed of 47mm/sec with a 60Hz  notched filter applied as appropriate. EEG data were recorded continuously and digitally stored.  Video monitoring was available and reviewed as appropriate. Description: The posterior dominant rhythm consists of 7.5 Hz activity of moderate voltage (25-35 uV) seen predominantly in posterior head regions, symmetric and  reactive to eye opening and eye closing. EEG showed intermittent generalized sharply contoured high amplitude 2-3hz  delta slowing. Hyperventilation and photic stimulation were not performed.   ABNORMALITY - Intermittent slow, generalized IMPRESSION: This study is suggestive of mild diffuse encephalopathy, nonspecific etiology. No seizures or epileptiform discharges were seen throughout the recording.  If suspicion for interictal activity remains a concern, a prolonged study can be considered. Charlsie Quest   DG Abd 1 View  Result Date: 08/01/2022 CLINICAL DATA:  Abdominal distension. EXAM: ABDOMEN - 1 VIEW COMPARISON:  CT AP 07/29/2022 FINDINGS: No dilated loops of large  or small bowel identified. Stool noted within the colon. No abnormal abdominal or pelvic calcifications. Visualized osseous structures appear intact. IMPRESSION: Nonobstructive bowel gas pattern. Electronically Signed   By: Signa Kell M.D.   On: 08/01/2022 12:41   DG Chest Port 1 View  Result Date: 07/31/2022 CLINICAL DATA:  Possible aspiration EXAM: PORTABLE CHEST 1 VIEW COMPARISON:  07/29/2022, 07/07/2022 FINDINGS: Cardiac shadow is stable. Widened mediastinum is again identified accentuated by the portable technique. This is consistent with the known mediastinal adenopathy seen on prior CT. Lungs are well aerated bilaterally. Mild bibasilar atelectasis is seen. No sizable effusion is noted. No bony abnormality is seen. IMPRESSION: Widened mediastinum consistent with the known mediastinal adenopathy. Mild bibasilar atelectasis. Electronically Signed   By: Alcide Clever M.D.   On: 07/31/2022 23:06   CT MAXILLOFACIAL WO CONTRAST  Result Date: 07/30/2022 CLINICAL DATA:  Right facial pain EXAM: CT MAXILLOFACIAL WITHOUT CONTRAST TECHNIQUE: Multidetector CT imaging of the maxillofacial structures was performed. Multiplanar CT image reconstructions were also generated. RADIATION DOSE REDUCTION: This exam was performed according to the  departmental dose-optimization program which includes automated exposure control, adjustment of the mA and/or kV according to patient size and/or use of iterative reconstruction technique. COMPARISON:  None Available. FINDINGS: Osseous: No fracture or mandibular dislocation. No destructive process. Large periapical lucency at the root of the most posterior remaining right maxillary 2. Orbits: Negative. No traumatic or inflammatory finding. Sinuses: Right anterior ethmoid and maxillary sinus complete opacification with polypoid contours. Soft tissues: Multiple enlarged submandibular lymph nodes, measuring up to 1.6 cm. No abscess or fluid collection. Limited intracranial: No significant or unexpected finding. IMPRESSION: 1. Large periapical lucency at the root of the most posterior remaining right maxillary tooth. No associated abscess. 2. Right anterior ethmoid and maxillary sinus complete opacification with polypoid contours, possibly antrochoanal polyp. 3. Multiple enlarged submandibular lymph nodes, measuring up to 1.6 cm, likely reactive. Electronically Signed   By: Deatra Robinson M.D.   On: 07/30/2022 20:38   MR BRAIN W WO CONTRAST  Result Date: 07/30/2022 CLINICAL DATA:  Seizure and headache EXAM: MRI HEAD WITHOUT AND WITH CONTRAST TECHNIQUE: Multiplanar, multiecho pulse sequences of the brain and surrounding structures were obtained without and with intravenous contrast. CONTRAST:  9mL GADAVIST GADOBUTROL 1 MMOL/ML IV SOLN COMPARISON:  None Available. FINDINGS: Brain: No acute infarct, mass effect or extra-axial collection. No acute or chronic hemorrhage. There is multifocal hyperintense T2-weighted signal within the white matter. Parenchymal volume and CSF spaces are normal. The midline structures are normal. There is no abnormal contrast enhancement. Vascular: Major flow voids are preserved. Skull and upper cervical spine: Normal calvarium and skull base. Visualized upper cervical spine and soft tissues  are normal. Sinuses/Orbits:Opacification of the right maxillary and right anterior ethmoid sinuses. Normal orbits. IMPRESSION: 1. No acute intracranial abnormality. 2. Multifocal hyperintense T2-weighted signal within the white matter, most consistent with chronic small vessel ischemia. 3. Polypoid lesion of the right maxillary and ethmoid sinuses, possibly antrochoanal polyp. Nonemergent ENT consultation might be helpful. Electronically Signed   By: Deatra Robinson M.D.   On: 07/30/2022 20:34   CT ABDOMEN PELVIS WO CONTRAST  Result Date: 07/29/2022 CLINICAL DATA:  Acute abdominal pain. EXAM: CT ABDOMEN AND PELVIS WITHOUT CONTRAST TECHNIQUE: Multidetector CT imaging of the abdomen and pelvis was performed following the standard protocol without IV contrast. RADIATION DOSE REDUCTION: This exam was performed according to the departmental dose-optimization program which includes automated exposure control, adjustment of the mA and/or kV according  to patient size and/or use of iterative reconstruction technique. COMPARISON:  CT examination dated Jul 15, 2022 FINDINGS: Lower chest: Bibasilar subsegmental atelectasis. Hepatobiliary: No focal liver abnormality is seen. No gallstones, gallbladder wall thickening, or biliary dilatation. Pancreas: Mild generalized pancreatic atrophy. No pancreatic ductal dilatation or surrounding inflammatory changes. Spleen: Normal in size without focal abnormality. Adrenals/Urinary Tract: Adrenal glands are unremarkable. Kidneys are normal, without renal calculi, focal lesion, or hydronephrosis. Bilateral exophytic renal cysts, likely simple cysts. Bladder is unremarkable. Stomach/Bowel: Stomach is within normal limits. Appendix appears normal. Scattered colonic diverticulosis without evidence of acute diverticulitis. No evidence of bowel wall thickening, distention, or inflammatory changes. Vascular/Lymphatic: No significant vascular findings are present. There numerous lymph nodes of  multiple sizes which are para-aortic, mesenteric, porta hepatis as well as multiple lymph nodes about the spleen as well as few lymph nodes in the pelvis. These are not significantly changed since prior examination. Reproductive: Postsurgical changes in the prostate bed. Other: Scattered soft tissue densities in the anterior abdominal wall, which may represent lymph nodes. No abdominopelvic ascites. Musculoskeletal: Compression fracture of the L4 vertebral body is unchanged. No acute osseous abnormality. IMPRESSION: 1. No CT evidence of acute abdominal/pelvic process. 2. Scattered colonic diverticulosis without evidence of acute diverticulitis. 3. Numerous lymph nodes of multiple sizes are present throughout the abdomen and pelvis predominantly in the mesentery and para-aortic region, which are not significantly changed since prior examination. 4. Chronic compression fracture of the L4 vertebral body. Electronically Signed   By: Larose Hires D.O.   On: 07/29/2022 19:26   CT Head Wo Contrast  Result Date: 07/29/2022 CLINICAL DATA:  Seizures, new onset. EXAM: CT HEAD WITHOUT CONTRAST TECHNIQUE: Contiguous axial images were obtained from the base of the skull through the vertex without intravenous contrast. RADIATION DOSE REDUCTION: This exam was performed according to the departmental dose-optimization program which includes automated exposure control, adjustment of the mA and/or kV according to patient size and/or use of iterative reconstruction technique. COMPARISON:  None Available. FINDINGS: Brain: No evidence of acute infarction, hemorrhage, hydrocephalus, extra-axial collection or mass lesion/mass effect. Vascular: No hyperdense vessel or unexpected calcification. Skull: Normal. Negative for fracture or focal lesion. Sinuses/Orbits: Mucosal thickening of the ethmoid air cells. Other: None. IMPRESSION: 1. No acute intracranial abnormality. 2. Mucosal thickening of the ethmoid air cells. Electronically Signed    By: Larose Hires D.O.   On: 07/29/2022 19:15   ECHOCARDIOGRAM COMPLETE  Result Date: 07/29/2022    ECHOCARDIOGRAM REPORT   Patient Name:   Gavril Radich Date of Exam: 07/29/2022 Medical Rec #:  [id number]    Height:       67.0 in Accession #:    [id number]   Weight:       185.9 lb Date of Birth:  07/23/1950    BSA:          1.960 m Patient Age:    72 years     BP:           111/73 mmHg Patient Gender: M            HR:           117 bpm. Exam Location:  Outpatient Procedure: 2D Echo, Color Doppler and Cardiac Doppler Indications:    Chemo  History:        Patient has prior history of Echocardiogram examinations, most                 recent 12/16/2021. Risk Factors:Hypertension. Prostate Cancer,  Lymphoma, Chemotherapy.  Sonographer:    Milbert Coulter Referring Phys: Johney Maine  Sonographer Comments: Image acquisition challenging due to respiratory motion and Tachycardia. IMPRESSIONS  1. Intracavitary gradient is realted to dynamic function. Left ventricular ejection fraction, by estimation, is >75%. The left ventricle has hyperdynamic function. The left ventricle has no regional wall motion abnormalities. There is mild concentric left ventricular hypertrophy. Left ventricular diastolic parameters were normal.  2. Right ventricular systolic function is hyperdynamic. The right ventricular size is normal. Tricuspid regurgitation signal is inadequate for assessing PA pressure.  3. The mitral valve is normal in structure. No evidence of mitral valve regurgitation.  4. The aortic valve is tricuspid. There is mild calcification of the aortic valve. Aortic valve regurgitation is not visualized. No aortic stenosis is present. Comparison(s): No significant change from prior study. FINDINGS  Left Ventricle: Intracavitary gradient is realted to dynamic function. Left ventricular ejection fraction, by estimation, is >75%. The left ventricle has hyperdynamic function. The left ventricle has no regional wall  motion abnormalities. The left ventricular internal cavity size was small. There is mild concentric left ventricular hypertrophy. Left ventricular diastolic parameters were normal. Right Ventricle: The right ventricular size is normal. No increase in right ventricular wall thickness. Right ventricular systolic function is hyperdynamic. Tricuspid regurgitation signal is inadequate for assessing PA pressure. Left Atrium: Left atrial size was normal in size. Right Atrium: Right atrial size was normal in size. Pericardium: Trivial pericardial effusion is present. The pericardial effusion is posterior to the left ventricle. Mitral Valve: The mitral valve is normal in structure. No evidence of mitral valve regurgitation. Tricuspid Valve: The tricuspid valve is normal in structure. Tricuspid valve regurgitation is not demonstrated. Aortic Valve: The aortic valve is tricuspid. There is mild calcification of the aortic valve. Aortic valve regurgitation is not visualized. No aortic stenosis is present. Aortic valve mean gradient measures 10.0 mmHg. Aortic valve peak gradient measures 18.5 mmHg. Aortic valve area, by VTI measures 2.53 cm. Pulmonic Valve: The pulmonic valve was not well visualized. Pulmonic valve regurgitation is not visualized. Aorta: The aortic root and ascending aorta are structurally normal, with no evidence of dilitation. IAS/Shunts: The interatrial septum was not well visualized.  LEFT VENTRICLE PLAX 2D LVIDd:         4.00 cm     Diastology LVIDs:         1.80 cm     LV e' medial:    8.24 cm/s LV PW:         1.10 cm     LV E/e' medial:  7.5 LV IVS:        1.20 cm     LV e' lateral:   11.70 cm/s LVOT diam:     2.10 cm     LV E/e' lateral: 5.3 LV SV:         72 LV SV Index:   37 LVOT Area:     3.46 cm  LV Volumes (MOD) LV vol d, MOD A2C: 27.2 ml LV vol d, MOD A4C: 49.7 ml LV vol s, MOD A2C: 7.1 ml LV vol s, MOD A4C: 13.6 ml LV SV MOD A2C:     20.1 ml LV SV MOD A4C:     49.7 ml LV SV MOD BP:      26.5 ml  RIGHT VENTRICLE TAPSE (M-mode): 2.0 cm LEFT ATRIUM             Index LA diam:  3.50 cm 1.79 cm/m LA Vol (A2C):   26.6 ml 13.57 ml/m LA Vol (A4C):   36.9 ml 18.82 ml/m LA Biplane Vol: 31.3 ml 15.97 ml/m  AORTIC VALVE AV Area (Vmax):    2.77 cm AV Area (Vmean):   2.66 cm AV Area (VTI):     2.53 cm AV Vmax:           215.00 cm/s AV Vmean:          146.000 cm/s AV VTI:            0.285 m AV Peak Grad:      18.5 mmHg AV Mean Grad:      10.0 mmHg LVOT Vmax:         172.00 cm/s LVOT Vmean:        112.000 cm/s LVOT VTI:          0.208 m LVOT/AV VTI ratio: 0.73  AORTA Ao Asc diam: 2.90 cm MITRAL VALVE MV Area (PHT): 4.49 cm    SHUNTS MV Decel Time: 169 msec    Systemic VTI:  0.21 m MV E velocity: 61.90 cm/s  Systemic Diam: 2.10 cm MV A velocity: 89.20 cm/s MV E/A ratio:  0.69 Riley Lam MD Electronically signed by Riley Lam MD Signature Date/Time: 07/29/2022/4:01:01 PM    Final    DG Chest Port 1 View  Result Date: 07/29/2022 CLINICAL DATA:  Cough.  Syncope. EXAM: PORTABLE CHEST 1 VIEW COMPARISON:  May 23, 2022.  Jul 07, 2022. FINDINGS: Prominence of the superior mediastinum is noted consistent with adenopathy as noted on prior CT scan. Stable cardiac size. Lungs are clear. Bony thorax is unremarkable. IMPRESSION: Widening of superior mediastinum consistent with adenopathy noted on prior CT scan of Jul 07, 2022. This is concerning for lymphoma or metastatic disease. Electronically Signed   By: Lupita Raider M.D.   On: 07/29/2022 15:34   [place] CORE BIOPSY (LYMPH NODES)  Result Date: 07/16/2022 INDICATION: 72 year old male referred for biopsy of axillary lymph node EXAM: ULTRASOUND-GUIDED BIOPSY AXILLARY LYMPH NODE MEDICATIONS: None. ANESTHESIA/SEDATION: None COMPLICATIONS: None PROCEDURE: Informed written consent was obtained from the patient with interpreter after a thorough discussion of the procedural risks, benefits and alternatives. All questions were addressed. Maximal Sterile  Barrier Technique was utilized including caps, mask, sterile gowns, sterile gloves, sterile drape, hand hygiene and skin antiseptic. A timeout was performed prior to the initiation of the procedure. Ultrasound survey was performed with images stored and sent to PACs. The left axillary region was prepped with chlorhexidine in a sterile fashion, and a sterile drape was applied covering the operative field. A sterile gown and sterile gloves were used for the procedure. Local anesthesia was provided with 1% Lidocaine. Ultrasound guidance was used to infiltrate the region with 1% lidocaine for local anesthesia. Four separate 16 gauge core needle biopsy were then acquired of the left axillary lymph node using ultrasound guidance. Images were stored. Final image was stored after biopsy. Patient tolerated the procedure well and remained hemodynamically stable throughout. No complications were encountered and no significant blood loss was encounter IMPRESSION: Status post ultrasound-guided biopsy of left axillary lymph node Signed, Yvone Neu. Miachel Roux, RPVI [facility] Radiology Electronically Signed   By: Gilmer Mor D.O.   On: 07/16/2022 17:07   CT ABDOMEN PELVIS W CONTRAST  Result Date: 07/15/2022 CLINICAL DATA:  Possible lymphoma. Bowel obstruction suspected. Right lower quadrant and right upper quadrant abdominal pain for 6 weeks. Nausea  and vomiting. Scheduled for a lymph node biopsy tomorrow. EXAM: CT ABDOMEN AND PELVIS WITH CONTRAST TECHNIQUE: Multidetector CT imaging of the abdomen and pelvis was performed using the standard protocol following bolus administration of intravenous contrast. RADIATION DOSE REDUCTION: This exam was performed according to the departmental dose-optimization program which includes automated exposure control, adjustment of the mA and/or kV according to patient size and/or use of iterative reconstruction technique. CONTRAST:  75mL  OMNIPAQUE IOHEXOL 350 MG/ML SOLN COMPARISON:  CT abdomen and pelvis 06/29/2022 FINDINGS: Lower chest: Mild curvilinear subsegmental atelectasis and/or scarring within the bilateral lung bases is similar to prior. Heart size is at the upper limits of normal. Coronary artery calcifications are noted. Hepatobiliary: Smooth liver contours. There are again punctate subcentimeter low-density lesions within the right hepatic lobe (axial series 3, image 25) and the left hepatic lobe (axial image 23) that are again too small to further characterize but not significantly changed from recent prior. The gallbladder is unremarkable. No intrahepatic or extrahepatic biliary ductal dilatation. Pancreas: Unremarkable. No pancreatic ductal dilatation or surrounding inflammatory changes. Spleen: The spleen measures 11 cm in length, within normal limits. There is a 2.9 x 2.6 x 2.6 cm low-density lesion within the medial aspect of the spleen that is unchanged from 07/07/2022 recent CT but new from 12/10/2021. Adrenals/Urinary Tract: Normal bilateral adrenals. The kidneys enhance uniformly and are symmetric in size. There are again bilateral fluid attenuation renal cysts, measuring up to 3.8 cm on the right and 2.8 cm on the left. No follow-up imaging is recommended. The urinary bladder is decompressed, limiting evaluation. Stomach/Bowel: Moderate amount of stool throughout the colon. Normal appearance of the terminal ileum. Normal appendix (axial series 3, image 60) no dilated loops of bowel are seen to indicate bowel obstruction. Vascular/Lymphatic: No abdominal aortic aneurysm. The major intra-abdominal aortic branch vessels are patent. No significant change in mesenteric, porta hepatis, retroperitoneal, and pelvic lymphadenopathy compared to 07/07/2022, again new from 12/10/2021. Reference gastrohepatic ligament 2.0 cm short axis lymph node (axial series 2, image 21), lymph node at the anterior aspect of the splenic hilum measuring  up to 2.0 cm in short axis (axial image 25), and conglomeration of aortocaval and para-aortic lymph nodes with a focal conglomeration measuring up to 2.8 cm in short axis (axial image 42), not significantly changed from 07/07/2022. Reproductive: The prostate again appears to be surgically absent. Other: No ventral abdominal hernia is seen. No free air or free fluid is seen within the abdomen or pelvis. Musculoskeletal: Chronic inferior L4 endplate mild concavity degenerative Schmorl's node is similar to prior. Anterior bridging osteophytes are again seen throughout the visualized inferior thoracic spine. There is complete anterior osseous fusion of the left sacroiliac joint. Moderate anterior right sacroiliac joint degenerative osteophytes. IMPRESSION: Compared to 07/07/2022: 1. No bowel obstruction.  Normal appendix. 2. No significant change in mesenteric, porta hepatis, retroperitoneal, and pelvic lymphadenopathy compared to 07/07/2022, again new from 12/10/2021. 3. No significant change in 2.9 cm low-density lesion within the medial aspect of the spleen compared to 07/07/2022 but new from 12/10/2021. 4. Constellation of findings are again concerning for lymphoma versus metastatic disease. Electronically Signed   By: Neita Garnet M.D.   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  # 7 Anemia and mild thrombocytopenia -- likely multifactorial -- lymphoma + MDS+ >?sepsis r/o GI bleeding.  #8 Abdominal distention with partial obstruction versus ileus. PLAN -Labs reviewed and discussed with the patient and wife in person and his daughter Karena Addison on the phone. -Was briefly on the floor but had some nausea vomiting and also some increased work of breathing and was sent back to the ICU. -He is continuing to need some transfusion support - transfuse prn to maintain hgb>=8 -Port-A-Cath not possible due to concerns with infection and he had a PICC line placed today. -Will proceed with cycle 1 of R mini CHOP in the hospital. -Allopurinol for TLS prophylaxis -Monitor and manage electrolytes per hospital medicine. -NG tube placed today to decompress his GI track. -All the patient's and his daughter's questions were answered in details. -Appreciate excellent hospital medicine team cares by Dr.Gagan Lama. -Oncology will continue to follow closely.  .The total time spent in the appointment was 36 minutes* .  All of the patient's questions were answered with apparent satisfaction. The patient knows to call the clinic with any problems, questions or concerns.   Wyvonnia Lora MD MS AAHIVMS [facility] Hematology/Oncology Physician [facility]  .*Total Encounter Time as defined by the Centers for Medicare and Medicaid Services includes, in addition to the face-to-face time of a patient visit (documented in the note above) non-face-to-face time: obtaining and reviewing outside history, ordering and reviewing medications, tests or procedures, care coordination (communications with other health care professionals or caregivers) and documentation in the medical record.

## 2022-08-06 NOTE — Progress Notes (Signed)
Occupational Therapy Treatment Patient Details Name: Becker Christopher MRN: [id number] DOB: [birth year] Today's Date: 08/06/2022   History of present illness Mr. Welte is a 72 yr old male admitted to the hospital with poor po intake, abdominal pain, and near syncope. 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 Pt demonstrates a significant decline in functional status as compared to initial eval due to below deficits. Required Max A +2 for all mobility and Max A for ADL tasks. VSS on RA, however pt very fatigued. Daughter present toward end of session and states her Dad is more confused and "very slow" with his processing. At baseline, Avenir loves to go on long walks, listen to the news and read. Given decline in functional status, Polo will benefit from intensive inpatient follow up therapy, >3 hours/day. Will attempt to coordinate OT/PT visits with daughter's visit @ 1:30 to help with communication and progress rehab.    Recommendations for follow up therapy are one component of a multi-disciplinary discharge planning process, led by the attending physician.  Recommendations may be updated based on patient status, additional functional criteria and insurance authorization.    Assistance Recommended at Discharge Frequent or constant Supervision/Assistance  Patient can return home with the following  Assistance with cooking/housework;Help with stairs or ramp for entrance;A lot of help with bathing/dressing/bathroom;Two people to help with walking and/or transfers;Direct supervision/assist for medications management;Direct supervision/assist for financial management;Assist for transportation   Equipment Recommendations  None recommended by OT    Recommendations for Other Services      Precautions / Restrictions  Precautions Precautions: Fall Precaution Comments: back pain,L4 compression fracture, starting chemo 6/11 Restrictions Weight Bearing Restrictions: No       Mobility Bed Mobility Overal bed mobility: Needs Assistance Bed Mobility: Rolling, Supine to Sit, Sit to Supine Rolling: Max assist, Total assist   Supine to sit: Max assist, +2 for physical assistance Sit to supine: Total assist, +2 for physical assistance   General bed mobility comments: Pt now requiring 2 person assist for bed mobility    Transfers                   General transfer comment: unable to attempt due to weakness, fatigue, and pain with movement     Balance Overall balance assessment: Needs assistance Sitting-balance support: Feet unsupported Sitting balance-Leahy Scale: Fair Sitting balance - Comments: initially needed support, min A; leaning toward R - ? Offloading L hip                                   ADL either performed or assessed with clinical judgement   ADL Overall ADL's : Needs assistance/impaired Eating/Feeding: NPO   Grooming: Moderate assistance   Upper Body Bathing: Sitting;Maximal assistance   Lower Body Bathing: Sitting/lateral leans;Maximal assistance   Upper Body Dressing : Sitting;Maximal assistance   Lower Body Dressing: Total assistance Lower Body Dressing Details (indicate cue type and reason): able to static stand for pant mangement             Functional mobility during ADLs: Maximal assistance;+2 for physical assistance      Extremity/Trunk Assessment Upper Extremity Assessment Upper Extremity Assessment: Generalized weakness (@ 3+/5 throughout; weaker proximally; Able to hold arms at 90 flexion  briefly before flopping down to the bed   Lower Extremity Assessment Lower Extremity Assessment: Defer to PT evaluation        Vision       Perception     Praxis      Cognition Arousal/Alertness: Awake/alert Behavior During Therapy:  Flat affect Overall Cognitive Status: Difficult to assess Area of Impairment: Following commands                   Current Attention Level: Sustained   Following Commands: Follows one step commands inconsistently, Follows one step commands with increased time   Awareness: Intellectual Problem Solving: Slow processing General Comments: Speaks limited English.  Slow to respond to instructions.  Daughter came in and spoke to him in his native language and she reports he seems slower and more confused today        Exercises Exercises: General Upper Extremity General Exercises - Upper Extremity Shoulder Flexion: AAROM, Left, 5 reps Elbow Flexion: AAROM, Both, 5 reps    Shoulder Instructions       General Comments      Pertinent Vitals/ Pain       Pain Assessment Pain Assessment: Faces Faces Pain Scale: Hurts even more Pain Location: primarily with L LE movements, but did seem to have pain throughout his body Pain Descriptors / Indicators: Grimacing, Guarding Pain Intervention(s): Monitored during session  Home Living                                          Prior Functioning/Environment              Frequency  Min 1X/week        Progress Toward Goals  OT Goals(current goals can now be found in the care plan section)  Progress towards OT goals: Not progressing toward goals - comment;Goals drowngraded-see care plan  Acute Rehab OT Goals Patient Stated Goal: per daughter for her Dad to get stronger OT Goal Formulation: With patient/family Time For Goal Achievement: 08/20/22 Potential to Achieve Goals: Fair ADL Goals Pt Will Perform Grooming: with min guard assist;sitting Pt Will Perform Upper Body Bathing: with min assist;sitting Pt Will Perform Lower Body Bathing: with mod assist;bed level Pt Will Transfer to Toilet: with mod assist;with +2 assist;stand pivot transfer;bedside commode Pt/caregiver will Perform Home Exercise Program:  Increased strength;Both right and left upper extremity;With minimal assist;With written HEP provided Additional ADL Goal #1: Pt will maintain midline postural control EOB wtih S in preparation for ADL tasks  Plan Discharge plan needs to be updated    Co-evaluation    PT/OT/SLP Co-Evaluation/Treatment: Yes Reason for Co-Treatment: Complexity of the patient's impairments (multi-system involvement) PT goals addressed during session: Mobility/safety with mobility;Strengthening/ROM OT goals addressed during session: ADL's and self-care;Strengthening/ROM      AM-PAC OT "6 Clicks" Daily Activity     Outcome Measure   Help from another person eating meals?: Total (NPO) Help from another person taking care of personal grooming?: A Lot Help from another person toileting, which includes using toliet, bedpan, or urinal?: Total Help from another person bathing (including washing, rinsing, drying)?: A Lot Help from another person to put on and taking off regular upper body clothing?: A Lot Help from another person to put on and taking off regular lower body clothing?: Total 6 Click Score: 9    End of Session    OT Visit Diagnosis:  Pain;Muscle weakness (generalized) (M62.81);Other symptoms and signs involving cognitive function Pain - Right/Left: Left Pain - part of body: Hip;Leg (back and thighs)   Activity Tolerance Patient limited by fatigue   Patient Left in bed;with call bell/phone within reach   Nurse Communication Other (comment) (decline in status)        Time: 1610-9604 OT Time Calculation (min): 33 min  Charges: OT General Charges $OT Visit: 1 Visit OT Treatments $Self Care/Home Management : 8-22 mins   Cosme Jacob,HILLARY 08/06/2022, 1:50 PM Luisa Dago, OT/L   Acute OT Clinical Specialist Acute Rehabilitation Services Pager [phone] Office [phone]

## 2022-08-06 NOTE — Progress Notes (Signed)
Physical Therapy Treatment Patient Details Name: Tyler [name] MRN: [id number] DOB: [birth year] Today's Date: 08/06/2022   History of Present Illness Mr. Michaelson is a 72 yr old male admitted to the hospital with poor po intake, abdominal pain, and near syncope. 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 User may not have seen previous data.)    PT Comments    Pt recently moved to step-down unit.  Daughter in at end of session and reports he seems a bit slower today.  He is now requiring +2 A for bed mobility.  He is starting chemo today.  He will likely need more intense post acute therapy at time of discharge.   Recommendations for follow up therapy are one component of a multi-disciplinary discharge planning process, led by the attending physician.  Recommendations may be updated based on patient status, additional functional criteria and insurance authorization.  Follow Up Recommendations  Can patient physically be transported by private vehicle: No    Assistance Recommended at Discharge Intermittent Supervision/Assistance  Patient can return home with the following A lot of help with walking and/or transfers;A lot of help with bathing/dressing/bathroom;Help with stairs or ramp for entrance   Equipment Recommendations  None recommended by PT    Recommendations for Other Services       Precautions / Restrictions Precautions Precautions: Fall (Simultaneous filing. User may not have seen previous data.) Precaution Comments: back pain,L4 compression fracture, starting chemo 6/11 (Simultaneous filing. User may not have seen previous data.) Restrictions Weight Bearing Restrictions: No (Simultaneous filing. User may not have seen previous data.)     Mobility  Bed Mobility Overal bed mobility: Needs Assistance (Simultaneous filing.  User may not have seen previous data.) Bed Mobility: Rolling, Supine to Sit, Sit to Supine (Simultaneous filing. User may not have seen previous data.) Rolling: Max assist, Total assist (Simultaneous filing. User may not have seen previous data.)   Supine to sit: Max assist, +2 for physical assistance (Simultaneous filing. User may not have seen previous data.) Sit to supine: Total assist, +2 for physical assistance (Simultaneous filing. User may not have seen previous data.)   General bed mobility comments: Pt now requiring 2 person assist for bed mobility    Transfers                   General transfer comment: unable to attempt due to weakness, fatigue, and pain with movement (Simultaneous filing. User may not have seen previous data.)    Ambulation/Gait                   Stairs             Wheelchair Mobility    Modified Rankin (Stroke Patients Only)       Balance   Sitting-balance support: Feet unsupported (Simultaneous filing. User may not have seen previous data.) Sitting balance-Leahy Scale: Fair (Simultaneous filing. User may not have seen previous data.) Sitting balance - Comments: initially needed support, but improved, but close MIN/guard                                    Cognition Arousal/Alertness: Awake/alert (Simultaneous filing. User may not have seen previous data.) Behavior During Therapy: Flat affect (Simultaneous filing. User may not have seen previous data.) Overall Cognitive  Status: Difficult to assess (Simultaneous filing. User may not have seen previous data.) Area of Impairment: Following commands (Simultaneous filing. User may not have seen previous data.)                       Following Commands: Follows one step commands inconsistently, Follows one step commands with increased time (Simultaneous filing. User may not have seen previous data.)       General Comments: Speaks limited [place].  Slow to  respond to instructions.  Daughter came in and spoke to him in his native language and she reports he seems slower and more confused today (Simultaneous filing. User may not have seen previous data.)        Exercises      General Comments        Pertinent Vitals/Pain Pain Assessment Pain Assessment: Faces (Simultaneous filing. User may not have seen previous data.) Faces Pain Scale: Hurts even more (Simultaneous filing. User may not have seen previous data.) Pain Location: primarily with L LE movements, but did seem to have pain throughout his body (Simultaneous filing. User may not have seen previous data.) Pain Descriptors / Indicators: Grimacing, Guarding (Simultaneous filing. User may not have seen previous data.) Pain Intervention(s): Monitored during session, Limited activity within patient's tolerance (Simultaneous filing. User may not have seen previous data.)    Home Living                          Prior Function            PT Goals (current goals can now be found in the care plan section) Progress towards PT goals: Not progressing toward goals - comment (weaker today (recently moved to step down unit))    Frequency    Min 1X/week      PT Plan Discharge plan needs to be updated    Co-evaluation PT/OT/SLP Co-Evaluation/Treatment: Yes Reason for Co-Treatment: Complexity of the patient's impairments (multi-system involvement) (Simultaneous filing. User may not have seen previous data.) PT goals addressed during session: Mobility/safety with mobility;Strengthening/ROM OT goals addressed during session: ADL's and self-care;Strengthening/ROM      AM-PAC PT "6 Clicks" Mobility   Outcome Measure  Help needed turning from your back to your side while in a flat bed without using bedrails?: A Lot Help needed moving from lying on your back to sitting on the side of a flat bed without using bedrails?: A Lot Help needed moving to and from a bed to a chair  (including a wheelchair)?: Total Help needed standing up from a chair using your arms (e.g., wheelchair or bedside chair)?: Total Help needed to walk in hospital room?: Total Help needed climbing 3-5 steps with a railing? : Total 6 Click Score: 8    End of Session   Activity Tolerance: Patient limited by fatigue;Patient limited by pain Patient left: in bed;with call bell/phone within reach;with bed alarm set Nurse Communication: Mobility status PT Visit Diagnosis: Other abnormalities of gait and mobility (R26.89);Difficulty in walking, not elsewhere classified (R26.2)     Time: 1610-9604 PT Time Calculation (min) (ACUTE ONLY): 33 min  Charges:  $Therapeutic Activity: 8-22 mins                     Colin Broach., PT Office [phone] Acute Rehab 08/06/2022    Enzo Montgomery 08/06/2022, 1:48 PM

## 2022-08-06 NOTE — Progress Notes (Signed)
[facility]  PROGRESS NOTE  Tyler [name] ZOX:[id number] DOB: [birth year] DOA: 07/29/2022 PCP: Nechama Guard, FNP   Brief HPI:   72 year old male with history of MDS that converted to follicle lymphoma came to ED on 07/28/2022 with poor p.o. intake, tachycardia and episode of shaking and unresponsiveness then confused thereafter.  He was hypotensive, tachycardic and had lactic acidosis.  Temperature 100.7.  Blood cultures were obtained and patient started on IV antibiotics for sepsis of unclear origin.    Assessment/Plan:   Lactic acidosis/sepsis -Unclear source, started on vancomycin and cefepime -Blood culture showed no growth till date -Received IV fluids, blood pressure is stable - vancomycin was discontinued, - cefepime was discontinued after 7 days  Vomiting/abdominal distention -Abdominal x-ray showed significant distention of stomach -Also was vomiting brown-colored vomitus yesterday -NG tube to low intermittent suction placed -Will obtain abdominal x-ray today  Acute kidney injury -Patient creatinine is now elevated to 2.38 -Likely in setting of dehydration -Nephrology has been consulted -Check urine sodium, urine creatinine, UA, renal ultrasound -Started on LR at 100 mL/h -Nephrology consulted, Dr. Arlean Hopping to see in consult  Paroxysmal atrial fibrillation - transferred to SDU  -started on amiodarone gtt; switched to p.o. amiodarone -Not a candidate for anticoagulation; he has a history of PE and was on Eliquis which was stopped in 2023 after he developed hematuria.  Also has history of thrombocytopenia with normocytic anemia -He is now back to normal sinus rhythm. -Cardiology consulted; have signed off,  Dyspnea/wheezing -Chest x-ray showed widened mediastinum, discussed with pulmonology. -Repeat CT chest with contrast obtained shows significant progression of bulky bilateral axillary, bilateral hilar, bilateral mediastinal, bilateral lower leg and upper  abdominal lymphadenopathy.  Multiple new splenic masses.  Finding compatible with aggressive lymphoma -Plan to start chemotherapy today  Seizure-like activity -Reported prior to admission with short postictal. -MRI brain showed multifocal hyperintense T2 weighted signal within the white matter, most consistent with chronic small vessel ischemia -EEG obtained shows mild diffuse encephalopathy, no seizures or epileptiform discharges  Right facial paresthesia -CT maxillofacial showed large periapical lucency at the root of the most posterior remaining right maxillary tooth.  No abscess -Right anterior ethmoid and maxillary sinus complete opacification with polypoid continues, possibly antrochoanal polyp -Multiple enlarged submandibular lymph nodes measuring up to 1.6 cm, likely reactive  Chronic hyponatremia -Sodium has been low for past 1 month -Sodium is low at 125 -Serum osmolality is 271, urine osmolality 702  Constipation -Had BM yesterday with suppository -Abdomen x-ray obtained  showed nonobstructive bowel gas pattern  Normocytic anemia -Likely in setting of lymphoma -S/p 4 units PRBC with hemoglobin is 6.3 today -Will transfuse 1 unit PRBC   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 for chemotherapy initiation after port insertion -Due to disease burden he was placed on prednisone 60 mg daily for 10 days, last dose was on the day of admission -Oncology saw patient yesterday, plan for R-miniCHOP 1st cycle as inpatient  -Discussed with Dr. Candise Che, PICC line inserted, plan to start chemotherapy per oncology  Thrombocytopenia -Platelet count is stable  Insomnia, anxiety, nightmares -Continue Seroquel nightly  History of prostate cancer -S/p prostatectomy  L4 compression fracture/back pain -Complains of pain in the back and legs -Continue fentanyl patch 50 mcg daily -Continue morphine 2 mg IV every 2 hours as needed  Hiccups -Started Thorazine 10 mg p.o. 3 times  daily as needed  Hyperkalemia -Potassium is 5.7 today -Kayexalate enema 45 g x 1  Medications     sodium  chloride   Intravenous Once   amiodarone  400 mg Per NG tube BID   Followed by   Melene Muller ON 08/10/2022] amiodarone  200 mg Per Tube Daily   Chlorhexidine Gluconate Cloth  6 each Topical Daily   enoxaparin (LOVENOX) injection  40 mg Subcutaneous Q24H   feeding supplement (KATE FARMS STANDARD 1.4)  325 mL Oral BID BM   fentaNYL  1 patch Transdermal Q72H   metoprolol tartrate  25 mg Per NG tube BID   multivitamin with minerals  1 tablet Per Tube Daily   polyethylene glycol  17 g Oral Daily   QUEtiapine  25 mg Per NG tube QHS   senna  2 tablet Per NG tube Daily   sodium chloride flush  10-40 mL Intracatheter Q12H     Data Reviewed:   CBG:  Recent Labs  Lab 08/01/22 0010  GLUCAP 110*    SpO2: 95 % O2 Flow Rate (L/min): 2 L/min    Vitals:   08/06/22 0547 08/06/22 0600 08/06/22 0602 08/06/22 0618  BP: (!) 111/54 (!) 94/55 (!) 94/55   Pulse: 88 86 86   Resp: (!) 21 17 18    Temp: 97.9 F (36.6 C)  98.2 F (36.8 C)   TempSrc: Oral     SpO2:  93%  95%  Weight:      Height:          Data Reviewed:  Basic Metabolic Panel: Recent Labs  Lab 07/31/22 2254 08/01/22 0251 08/02/22 0004 08/03/22 0848 08/05/22 0617 08/06/22 0421  NA  --  124* 125* 126* 126* 128*  K  --  3.6 4.0 4.1 5.5* 5.7*  CL  --  95* 97* 96* 98 98  CO2  --  20* 19* 19* 18* 19*  GLUCOSE  --  112* 117* 111* 113* 88  BUN  --  22 24* 31* 43* 60*  CREATININE  --  0.84 0.82 0.97 1.36* 2.38*  CALCIUM  --  7.6* 7.5* 8.1* 8.8* 9.0  MG 1.8  --  1.7  --   --   --   PHOS 2.8  --  2.8  --   --   --     CBC: Recent Labs  Lab 08/01/22 0251 08/02/22 0004 08/03/22 0848 08/03/22 1929 08/05/22 0617 08/06/22 0421  WBC 10.2 10.1 9.0  --  9.3 7.7  HGB 8.0* 7.4* 6.6* 8.7* 7.6* 6.3*  HCT 24.7* 23.4* 20.7* 27.7* 24.5* 20.4*  MCV 83.4 84.8 83.8  --  86.9 88.7  PLT 87* 92* 95*  --  96* 83*     LFT Recent Labs  Lab 07/31/22 0405 08/01/22 0251 08/05/22 0617 08/06/22 0421  AST 38 45* 76* 65*  ALT 29 34 38 37  ALKPHOS 72 74 94 96  BILITOT 2.2* 2.0* 1.9* 2.3*  PROT 4.6* 4.7* 5.0* 4.7*  ALBUMIN 2.0* 2.0* 1.9* 2.0*     Antibiotics: Anti-infectives (From admission, onward)    Start     Dose/Rate Route Frequency Ordered Stop   07/30/22 1600  vancomycin (VANCOREADY) IVPB 1750 mg/350 mL  Status:  Discontinued        1,750 mg 175 mL/hr over 120 Minutes Intravenous Every 24 hours 07/29/22 2108 08/01/22 1135   07/30/22 0000  ceFEPIme (MAXIPIME) 2 g in sodium chloride 0.9 % 100 mL IVPB        2 g 200 mL/hr over 30 Minutes Intravenous Every 8 hours 07/29/22 2108 08/05/22 1833   07/29/22 1530  ceFEPIme (MAXIPIME)  2 g in sodium chloride 0.9 % 100 mL IVPB        2 g 200 mL/hr over 30 Minutes Intravenous  Once 07/29/22 1517 07/29/22 1628   07/29/22 1530  metroNIDAZOLE (FLAGYL) IVPB 500 mg        500 mg 100 mL/hr over 60 Minutes Intravenous  Once 07/29/22 1517 07/29/22 1701   07/29/22 1530  vancomycin (VANCOCIN) IVPB 1000 mg/200 mL premix  Status:  Discontinued        1,000 mg 200 mL/hr over 60 Minutes Intravenous  Once 07/29/22 1517 07/29/22 1520   07/29/22 1530  vancomycin (VANCOREADY) IVPB 1750 mg/350 mL        1,750 mg 175 mL/hr over 120 Minutes Intravenous  Once 07/29/22 1520 07/29/22 1908        DVT prophylaxis: Lovenox  Code Status: Full code  Family Communication: Discussed with patient's daughter on phone  CONSULTS    Subjective   Patient seen and examined, still complains of abdominal tightness.  NG tube in place  Physical Examination:  Appears in no acute distress S1-S2, regular Lungs clear to auscultation bilaterally Abdomen is soft, distended, mild tenderness in epigastric region Extremities trace edema   Status is: Inpatient:          Meredeth Ide   [facility] If 7PM-7AM, please contact night-coverage at  [email or web address], Office  [phone]   08/06/2022, 7:34 AM  LOS: 8 days

## 2022-08-06 NOTE — Progress Notes (Addendum)
Ok to proceed with Mini-R-CHOP today. Scr is increasing: 6/8 = 0.97, 6/10 = 1.36, 6/11 = 2.38.  Ok to treat with hg - 6.3, pltc 83, Na 128, Scr 2.38, Tbili 2.3. Patient will receive PRBC today. Proceed with Cytoxan 500mg /m2 per Dr. Candise Che. Proceed with Adria and Vincristine full dose for mini-RCHOP. Tenofovir will begin for Hepatitis prophylaxis when renal function is stable. Change premedications to IV or per tube per MD. Solu-Medrol 80mg  daily x 4 days to begin on 08/07/22.  Tyler [name], [place], BCPS, BCOP 08/06/2022 9:38 AM

## 2022-08-06 NOTE — TOC Progression Note (Addendum)
Transition of Care [facility]) - Progression Note    Patient Details  Name: Ivey Cina MRN: [id number] Date of Birth: 21-Mar-1950  Transition of [facility]) CM/SW Contact  Beckie Busing, RN Phone Number:[phone]  08/06/2022, 10:34 PM  Clinical Narrative:    TOC acknowledges therapy recommendations for SNF. Patient anticipated start date for chemo is 6/12. TOC to follow up with family to initiate SNF bed search.     Expected Discharge Plan: Home/Self Care Barriers to Discharge: Continued Medical Work up  Expected Discharge Plan and Services                                               Social Determinants of Health (SDOH) Interventions SDOH Screenings   Food Insecurity: No Food Insecurity (07/30/2022)  Housing: Low Risk  (07/30/2022)  Transportation Needs: No Transportation Needs (07/30/2022)  Utilities: Not At Risk (07/30/2022)  Tobacco Use: Low Risk  (07/29/2022)    Readmission Risk Interventions    07/30/2022    2:44 PM  Readmission Risk Prevention Plan  Transportation Screening Complete  PCP or Specialist Appt within 3-5 Days Complete  HRI or Home Care Consult Complete  Social Work Consult for Recovery Care Planning/Counseling Complete  Palliative Care Screening Not Applicable  Medication Review Oceanographer) Complete

## 2022-08-07 ENCOUNTER — Inpatient Hospital Stay: Payer: 59

## 2022-08-07 DIAGNOSIS — D649 Anemia, unspecified: Secondary | ICD-10-CM | POA: Diagnosis not present

## 2022-08-07 DIAGNOSIS — Z9189 Other specified personal risk factors, not elsewhere classified: Secondary | ICD-10-CM | POA: Insufficient documentation

## 2022-08-07 DIAGNOSIS — C8338 Diffuse large B-cell lymphoma, lymph nodes of multiple sites: Secondary | ICD-10-CM | POA: Diagnosis not present

## 2022-08-07 DIAGNOSIS — N179 Acute kidney failure, unspecified: Secondary | ICD-10-CM

## 2022-08-07 DIAGNOSIS — E883 Tumor lysis syndrome: Secondary | ICD-10-CM

## 2022-08-07 LAB — CBC WITH DIFFERENTIAL/PLATELET
Abs Immature Granulocytes: 0.08 10*3/uL — ABNORMAL HIGH (ref 0.00–0.07)
Basophils Absolute: 0 10*3/uL (ref 0.0–0.1)
Basophils Relative: 0 %
Eosinophils Absolute: 0 10*3/uL (ref 0.0–0.5)
Eosinophils Relative: 0 %
HCT: 24.3 % — ABNORMAL LOW (ref 39.0–52.0)
Hemoglobin: 7.5 g/dL — ABNORMAL LOW (ref 13.0–17.0)
Immature Granulocytes: 1 %
Lymphocytes Relative: 11 %
Lymphs Abs: 0.9 10*3/uL (ref 0.7–4.0)
MCH: 27.5 pg (ref 26.0–34.0)
MCHC: 30.9 g/dL (ref 30.0–36.0)
MCV: 89 fL (ref 80.0–100.0)
Monocytes Absolute: 0.5 10*3/uL (ref 0.1–1.0)
Monocytes Relative: 7 %
Neutro Abs: 6.2 10*3/uL (ref 1.7–7.7)
Neutrophils Relative %: 81 %
Platelets: 60 10*3/uL — ABNORMAL LOW (ref 150–400)
RBC: 2.73 MIL/uL — ABNORMAL LOW (ref 4.22–5.81)
RDW: 18.9 % — ABNORMAL HIGH (ref 11.5–15.5)
WBC: 7.7 10*3/uL (ref 4.0–10.5)
nRBC: 0.3 % — ABNORMAL HIGH (ref 0.0–0.2)

## 2022-08-07 LAB — GLUCOSE, CAPILLARY: Glucose-Capillary: 106 mg/dL — ABNORMAL HIGH (ref 70–99)

## 2022-08-07 LAB — BPAM RBC
Blood Product Expiration Date: 202407102359
ISSUE DATE / TIME: 202406081352
ISSUE DATE / TIME: 202406110541
Unit Type and Rh: 5100
Unit Type and Rh: 5100

## 2022-08-07 LAB — TYPE AND SCREEN
ABO/RH(D): O POS
Antibody Screen: NEGATIVE

## 2022-08-07 LAB — COMPREHENSIVE METABOLIC PANEL
ALT: 39 U/L (ref 0–44)
AST: 67 U/L — ABNORMAL HIGH (ref 15–41)
Albumin: 1.9 g/dL — ABNORMAL LOW (ref 3.5–5.0)
Alkaline Phosphatase: 88 U/L (ref 38–126)
Anion gap: 15 (ref 5–15)
BUN: 73 mg/dL — ABNORMAL HIGH (ref 8–23)
CO2: 18 mmol/L — ABNORMAL LOW (ref 22–32)
Calcium: 8.7 mg/dL — ABNORMAL LOW (ref 8.9–10.3)
Chloride: 95 mmol/L — ABNORMAL LOW (ref 98–111)
Creatinine, Ser: 3.72 mg/dL — ABNORMAL HIGH (ref 0.61–1.24)
GFR, Estimated: 17 mL/min — ABNORMAL LOW (ref >60)
Glucose, Bld: 118 mg/dL — ABNORMAL HIGH (ref 70–99)
Potassium: 5.1 mmol/L (ref 3.5–5.1)
Sodium: 128 mmol/L — ABNORMAL LOW (ref 135–145)
Total Bilirubin: 2.8 mg/dL — ABNORMAL HIGH (ref 0.3–1.2)
Total Protein: 4.9 g/dL — ABNORMAL LOW (ref 6.5–8.1)

## 2022-08-07 LAB — RETICULOCYTES
Immature Retic Fract: 27.4 % — ABNORMAL HIGH (ref 2.3–15.9)
RBC.: 2.73 MIL/uL — ABNORMAL LOW (ref 4.22–5.81)
Retic Count, Absolute: 164.9 10*3/uL (ref 19.0–186.0)
Retic Ct Pct: 6 % — ABNORMAL HIGH (ref 0.4–3.1)

## 2022-08-07 LAB — MAGNESIUM: Magnesium: 2.6 mg/dL — ABNORMAL HIGH (ref 1.7–2.4)

## 2022-08-07 LAB — URIC ACID: Uric Acid, Serum: 11.1 mg/dL — ABNORMAL HIGH (ref 3.7–8.6)

## 2022-08-07 MED ORDER — FENTANYL 25 MCG/HR TD PT72: 1.0000 | MEDICATED_PATCH | TRANSDERMAL | Status: DC

## 2022-08-07 MED ORDER — SODIUM CHLORIDE 0.9 % IV SOLN
6.0000 mg | Freq: Once | INTRAVENOUS | Status: AC
  Administered 2022-08-07: 6 mg via INTRAVENOUS
  Filled 2022-08-07: qty 4

## 2022-08-07 NOTE — Progress Notes (Addendum)
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 intake, tachycardia and episode of shaking and unresponsiveness then confused thereafter.  He was hypotensive, tachycardic and had lactic acidosis.  Temperature 100.7.  Blood cultures were obtained and patient started on IV antibiotics for sepsis of unclear origin. Infectious workup was unremarkable.  His symptoms were subsequently thought to be secondary to lymphoma.  Oncology was consulted.  Patient was started on chemotherapy.    Assessment/Plan:   Lactic acidosis/sepsis -Source was unclear.  Patient was initially started on vancomycin and cefepime. -Blood culture showed no growth till date -Received IV fluids, blood pressure is stable - vancomycin was discontinued, - cefepime was discontinued after 7 days His presentation could have been due to lymphoma.  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 Candise [name]. -Due to disease burden he was placed on prednisone 60 mg daily for 10 days, last dose was on the day of admission Seen by oncology during this hospital stay.  PICC line has been placed.  Started on R-CHOP.  Management per medical oncology.  Hyperuricemia/concern for tumor lysis Patient has been on allopurinol.  To be given a dose of rasburicase today. Continue to check uric acid levels on a daily basis.  Acute kidney injury/hyperkalemia/metabolic acidosis Renal function continues to worsen.  Has a Foley catheter.  Nephrology is following.  Patient is currently on bicarbonate infusion.  Has had very minimal urine output in the last 24 hours.  Prognosis is guarded.  Renal ultrasound does not show any obstruction. Potassium level is better.  Colonic ileus/gastric distention Patient with abdominal distention.  Imaging studies raise concern  for ileus.  Patient apparently had multiple bowel movements yesterday after he was given Kayexalate.  Continue with NG tube to low intermittent suction.  Repeat abdominal films tomorrow morning.  Paroxysmal atrial fibrillation -started on amiodarone gtt; switched to p.o. amiodarone -Not a candidate for anticoagulation due to previous history of bleeding; he has a history of PE and was on Eliquis which was stopped in 2023 after he developed hematuria.  Also has history of thrombocytopenia with normocytic anemia Currently in sinus rhythm.   Cardiology was consulted but they have now signed off.  Seizure-like activity -Reported prior to admission with short postictal. -MRI brain showed multifocal hyperintense T2 weighted signal within the white matter, most consistent with chronic small vessel ischemia -EEG obtained shows mild diffuse encephalopathy, no seizures or epileptiform discharges Not on any antiepileptic agents at this time.  Right facial paresthesia -CT maxillofacial showed large periapical lucency at the root of the most posterior remaining right maxillary tooth.  No abscess -Right anterior ethmoid and maxillary sinus complete opacification with polypoid continues, possibly antrochoanal polyp -Multiple enlarged submandibular lymph nodes measuring up to 1.6 cm, likely reactive  Chronic hyponatremia -Sodium has been low for past 1 month -Sodium is low at 125 -Serum osmolality is 271, urine osmolality 702  Constipation -Had BM yesterday with suppository -Abdomen x-ray obtained  showed nonobstructive bowel gas pattern  Normocytic anemia -Likely in setting of lymphoma Has been transfused 5 units of PRBC so far.  Hemoglobin is 7.5 today.  Thrombocytopenia -Platelet count is stable  Insomnia, anxiety, nightmares -DC Seroquel for now.  History of prostate cancer -S/p prostatectomy  L4 compression fracture/back pain -Complains of pain in the back and legs -Continue fentanyl  patch 50 mcg daily -Continue morphine 2 mg IV every 2 hours as needed Noted to be somnolent this morning.  Will discontinue the fentanyl patch.  Hiccups -Started Thorazine 10 mg p.o. 3 times daily as needed  Goals of care Long discussion with patient's daughter Tyler [name] who is a Publishing rights manager.  She was told about the patient's current clinical condition including worsening kidney injury.  CODE STATUS was addressed with the patient's daughter.  She mentioned that when she spoke to the patient a few days ago, when he was of sound mind, he had expressed a wish for for full scope of care including CPR and life support.  She wants to respect that wish.  She understand that his kidney function may continue to worsen.  She was told that we will keep her updated.  DVT prophylaxis: SCD  Code Status: Full code  Family Communication: No family at bedside.   Medications   sodium chloride   Intravenous Once   acetaminophen  650 mg Per Tube Once   allopurinol  100 mg Per Tube BID   amiodarone  400 mg Per NG tube BID   Followed by   Melene Muller ON 08/10/2022] amiodarone  200 mg Per Tube Daily   Chlorhexidine Gluconate Cloth  6 each Topical Daily   diphenhydrAMINE  50 mg Intravenous Once   feeding supplement (KATE FARMS STANDARD 1.4)  325 mL Oral BID BM   fentaNYL  1 patch Transdermal Q72H   heparin injection (subcutaneous)  5,000 Units Subcutaneous Q8H   methylPREDNISolone (SOLU-MEDROL) injection  80 mg Intravenous Daily   metoprolol tartrate  25 mg Per NG tube BID   multivitamin with minerals  1 tablet Per Tube Daily   polyethylene glycol  17 g Oral Daily   QUEtiapine  25 mg Per NG tube QHS   senna  2 tablet Per NG tube Daily   sodium chloride flush  10-40 mL Intracatheter Q12H     Data Reviewed:   CBG:  Recent Labs  Lab 08/01/22 0010 08/06/22 1723  GLUCAP 110* 99     SpO2: 95 % O2 Flow Rate (L/min): 2 L/min    Vitals:   08/07/22 0400 08/07/22 0500 08/07/22 0600 08/07/22 0700   BP: 121/68 128/68 (!) 113/57 (!) 123/59  Pulse: 77 79 80 81  Resp: (!) 9 15 11 16   Temp: (!) 97 F (36.1 C)     TempSrc: Axillary     SpO2: 95% 96% 93% 95%  Weight:  90.9 kg    Height:       General appearance: Lethargic but arousable.  In no distress Resp: Mildly tachypneic.  Diminished air entry at the bases.  No definite wheezing or rhonchi. Cardio: S1-S2 is normal regular.  No S3-S4.  No rubs murmurs or bruit GI: Abdomen is distended.  Bowel sounds sluggish.  Nontender. Extremities: No edema.  Physical deconditioning noted. Neurologic: No obvious focal neurological deficits.    Data Reviewed:  Basic Metabolic Panel: Recent Labs  Lab 07/31/22 2254 08/01/22 0251 08/02/22 0004 08/03/22 0848 08/05/22 0617 08/06/22 0421 08/07/22 0601  NA  --    < > 125* 126* 126* 128* 128*  K  --    < > 4.0 4.1 5.5* 5.7* 5.1  CL  --    < > 97* 96* 98 98 95*  CO2  --    < > 19* 19* 18* 19* 18*  GLUCOSE  --    < > 117* 111* 113* 88 118*  BUN  --    < > 24* 31* 43* 60* 73*  CREATININE  --    < > 0.82 0.97 1.36* 2.38* 3.72*  CALCIUM  --    < > 7.5* 8.1* 8.8* 9.0 8.7*  MG 1.8  --  1.7  --   --   --  2.6*  PHOS 2.8  --  2.8  --   --   --   --    < > = values in this interval not displayed.     CBC: Recent Labs  Lab 08/02/22 0004 08/03/22 0848 08/03/22 1929 08/05/22 0617 08/06/22 0421 08/06/22 1107 08/07/22 0601  WBC 10.1 9.0  --  9.3 7.7  --  7.7  NEUTROABS  --   --   --   --   --   --  6.2  HGB 7.4* 6.6* 8.7* 7.6* 6.3* 7.7* 7.5*  HCT 23.4* 20.7* 27.7* 24.5* 20.4* 24.3* 24.3*  MCV 84.8 83.8  --  86.9 88.7  --  89.0  PLT 92* 95*  --  96* 83*  --  60*     LFT Recent Labs  Lab 08/01/22 0251 08/05/22 0617 08/06/22 0421 08/07/22 0601  AST 45* 76* 65* 67*  ALT 34 38 37 39  ALKPHOS 74 94 96 88  BILITOT 2.0* 1.9* 2.3* 2.8*  PROT 4.7* 5.0* 4.7* 4.9*  ALBUMIN 2.0* 1.9* 2.0* 1.9*       Subjective   Patient lethargic but easily arousable.  Answers a few questions.   Very slow to respond.  Denies any abdominal pain.  Does complain of pain in his chest area.  Osvaldo Shipper   [facility] If 7PM-7AM, please contact night-coverage at [email or web address], Office  [phone]   08/07/2022, 9:25 AM  LOS: 9 days

## 2022-08-07 NOTE — Progress Notes (Signed)
Per Dr. Candise Che, hold rituximab today. Scr increasing: 6/11 = 2.38, 6/12 = 3.72. Uric acid elevated: 6/11: 9.3, 6/12 = 11.1.  Will need to hold rituximab until UA and Scr have stabilized. Patient will receive rasburicase today.   Jerry Caras, PharmD PGY1 Pharmacy Resident   08/07/2022 9:39 AM

## 2022-08-07 NOTE — Progress Notes (Signed)
Nutrition Follow-up  DOCUMENTATION CODES:   Not applicable  INTERVENTION:  - Will monitor for nutrition plans.   - If unable to advance oral diet in the next 2-3 days, recommend nutrition support if medically appropriate.   NUTRITION DIAGNOSIS:   Increased nutrient needs related to chronic illness (lymphoma) as evidenced by estimated needs. *ongoing  GOAL:   Patient will meet greater than or equal to 90% of their needs *unmet  MONITOR:   PO intake, Supplement acceptance, Weight trends  REASON FOR ASSESSMENT:   Consult Poor PO  ASSESSMENT:   72 y.o. 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 Admitted for sepsis of unknown cause.  6/3 Admit 6/5 Regular diet 6/10 patient vomited, made NPO, NGT placed for decompression  Patient somewhat somnolent this morning at time of visit. Notes abdomen somewhat better than yesterday.   Abdominal xray yesterday showed concern for possible ileus. Reportedly had multiple bowel movements yesterday. NGT remains in place, to LIS. Plan for repeat abdominal xray tomorrow. Nutrition plans pending results.   Admit weight: 185# Current weight: 200# Patient currently +11L which is likely affecting current weight status.  Medications reviewed and include: MVI, Miralax, Senokot  Labs reviewed:  Na 128 Creatinine 3.72 Magnesium 2.6   Diet Order:   Diet Order             Diet NPO time specified  Diet effective now                   EDUCATION NEEDS:  Education needs have been addressed  Skin:  Skin Assessment: Reviewed RN Assessment  Last BM:  6/12  Height:  Ht Readings from Last 1 Encounters:  08/01/22 5\' 7"  (1.702 m)   Weight:  Wt Readings from Last 1 Encounters:  08/07/22 90.9 kg    BMI:  Body mass index is 31.39  kg/m.  Estimated Nutritional Needs:  Kcal:  1900-2100 kcals Protein:  100-115 grams Fluid:  >/= 1.9L    Shelle Iron RD, LDN For contact information, refer to [facility].

## 2022-08-07 NOTE — Progress Notes (Signed)
[facility] Progress Note  Subjective: seen in room, no new c/o's.  UOP yest was 550 cc but today there has only been about 10-15 cc UOP.  Creat up to 3.72 today.   Vitals:   08/07/22 1000 08/07/22 1100 08/07/22 1135 08/07/22 1200  BP: 108/68 106/70  100/68  Pulse: 77 73  71  Resp: (!) 9 12  12   Temp:   (!) 97.4 F (36.3 C)   TempSrc:   Oral   SpO2: 94% 95%  94%  Weight:      Height:        Exam: Gen alert, chronically ill appearing elderly male No rash, cyanosis or gangrene Sclera anicteric, throat clear  No jvd or bruits Chest clear bilat to bases, no rales/ wheezing RRR no RG Abd soft ntnd no mass or ascites +bs GU foley cath draining minimal urine MS no joint effusions or deformity Ext diffuse 1-2 LE > UE pitting edema Neuro is alert, nf      Home meds include - allopurinol, lisniopril 5 qd, reglan, MVI, elavil, gabapentin, prednisone, prns/ vits/ supps     Na 128 K+ 5.1  CO2 18  CL 95  BUN 73  creat 3.72  Ca 8.7    Alb 1.9  Uric 11.1 (9.3 yest)  Tbili 2.8  Hb 7.5  wbc 7K       UA turbid, 100 prot, few bact, 6-10 rbc/ 21-50 wbc, 0-5 epis    UNa < 10, UCr 130      Renal US - 10.4/ 10.8 cm kidneys w/o hydro, normal echo    CXR 6/10 - clear lungs, bilat LAN     Assessment/ Plan: AKI - b/l creatinine 0.6- 0.9 here on admission. Creat rose up to 1.3 on 6/10 and 2.3 today 6/11.  UA shows ^wbc, min rbc's/ protein. Renal US shows no obstruction. CXR from yest shows clear lungs. He did receive IV contrast on 6/08. Also on 6/08- 6/09 in the evening he did have a few low BP's in the 80s-90s. Uric acid also is up in the 9 -11 range so tumor lysis syndrome is a potential cause. ECHO done here showed normal LVEF. Suspect AKI due to TLS +  IV contrast +/- intravasc volume depletion/ low alb/ cap leak (less likely). Pt rec'd rasburicase today per ONC. Cont supportive care. I don't think IVF"s are helping and pt is at risk for vol overload, will reduce to 50 cc/hr. No  indication for RRT yet. Will follow closely.  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   No results for input(s): "IRON", "TIBC", "FERRITIN" in the last 168 hours. Inpatient medications:  sodium chloride   Intravenous Once   acetaminophen  650 mg Per Tube Once   allopurinol  100 mg Per Tube BID   amiodarone  400 mg Per NG tube BID   Followed by   Melene Muller ON 08/10/2022] amiodarone  200 mg Per Tube Daily   Chlorhexidine Gluconate Cloth  6 each Topical Daily   diphenhydrAMINE  50 mg Intravenous Once   feeding supplement (KATE FARMS STANDARD 1.4)  325 mL Oral BID BM   methylPREDNISolone (SOLU-MEDROL) injection  80 mg Intravenous Daily   metoprolol tartrate  25 mg Per NG tube BID   multivitamin with minerals  1 tablet Per Tube Daily   polyethylene glycol  17 g Oral Daily   senna  2 tablet Per NG tube Daily   sodium chloride flush  10-40 mL Intracatheter Q12H    sodium bicarbonate 150 mEq in sterile water 1,150 mL infusion 75 mL/hr at 08/07/22 1229   chlorproMAZINE, docusate sodium, HYDROmorphone (DILAUDID) injection, ipratropium-albuterol, lip balm, metoprolol tartrate, [START ON 08/08/2022] ondansetron **OR** [START ON 08/08/2022] ondansetron (ZOFRAN) IV, mouth rinse, phenol, sodium chloride, sodium chloride flush

## 2022-08-08 ENCOUNTER — Inpatient Hospital Stay (HOSPITAL_COMMUNITY): Payer: 59

## 2022-08-08 ENCOUNTER — Encounter (HOSPITAL_COMMUNITY): Payer: Self-pay | Admitting: Internal Medicine

## 2022-08-08 DIAGNOSIS — J988 Other specified respiratory disorders: Secondary | ICD-10-CM | POA: Diagnosis not present

## 2022-08-08 DIAGNOSIS — T17908A Unspecified foreign body in respiratory tract, part unspecified causing other injury, initial encounter: Secondary | ICD-10-CM | POA: Diagnosis present

## 2022-08-08 DIAGNOSIS — N170 Acute kidney failure with tubular necrosis: Secondary | ICD-10-CM | POA: Diagnosis present

## 2022-08-08 DIAGNOSIS — J690 Pneumonitis due to inhalation of food and vomit: Secondary | ICD-10-CM | POA: Diagnosis not present

## 2022-08-08 DIAGNOSIS — J9601 Acute respiratory failure with hypoxia: Secondary | ICD-10-CM

## 2022-08-08 DIAGNOSIS — E883 Tumor lysis syndrome: Secondary | ICD-10-CM | POA: Insufficient documentation

## 2022-08-08 DIAGNOSIS — C8338 Diffuse large B-cell lymphoma, lymph nodes of multiple sites: Secondary | ICD-10-CM | POA: Diagnosis not present

## 2022-08-08 DIAGNOSIS — N19 Unspecified kidney failure: Secondary | ICD-10-CM | POA: Diagnosis not present

## 2022-08-08 DIAGNOSIS — G9340 Encephalopathy, unspecified: Secondary | ICD-10-CM

## 2022-08-08 LAB — BLOOD GAS, ARTERIAL
Acid-base deficit: 2.9 mmol/L — ABNORMAL HIGH (ref 0.0–2.0)
Bicarbonate: 22.7 mmol/L (ref 20.0–28.0)
Drawn by: 331471
FIO2: 100 %
MECHVT: 530 mL
O2 Saturation: 100 %
PEEP: 5 cmH2O
Patient temperature: 37
RATE: 18 resp/min
pCO2 arterial: 42 mmHg (ref 32–48)
pH, Arterial: 7.34 — ABNORMAL LOW (ref 7.35–7.45)
pO2, Arterial: 240 mmHg — ABNORMAL HIGH (ref 83–108)

## 2022-08-08 LAB — RENAL FUNCTION PANEL
Albumin: 1.9 g/dL — ABNORMAL LOW (ref 3.5–5.0)
Anion gap: 16 — ABNORMAL HIGH (ref 5–15)
BUN: 105 mg/dL — ABNORMAL HIGH (ref 8–23)
CO2: 20 mmol/L — ABNORMAL LOW (ref 22–32)
Calcium: 7.3 mg/dL — ABNORMAL LOW (ref 8.9–10.3)
Chloride: 96 mmol/L — ABNORMAL LOW (ref 98–111)
Creatinine, Ser: 4.62 mg/dL — ABNORMAL HIGH (ref 0.61–1.24)
GFR, Estimated: 13 mL/min — ABNORMAL LOW (ref >60)
Glucose, Bld: 155 mg/dL — ABNORMAL HIGH (ref 70–99)
Phosphorus: 11.8 mg/dL — ABNORMAL HIGH (ref 2.5–4.6)
Potassium: 5.8 mmol/L — ABNORMAL HIGH (ref 3.5–5.1)
Sodium: 132 mmol/L — ABNORMAL LOW (ref 135–145)

## 2022-08-08 LAB — COMPREHENSIVE METABOLIC PANEL
ALT: 34 U/L (ref 0–44)
AST: 63 U/L — ABNORMAL HIGH (ref 15–41)
Albumin: 1.9 g/dL — ABNORMAL LOW (ref 3.5–5.0)
Alkaline Phosphatase: 84 U/L (ref 38–126)
Anion gap: 16 — ABNORMAL HIGH (ref 5–15)
BUN: 97 mg/dL — ABNORMAL HIGH (ref 8–23)
CO2: 22 mmol/L (ref 22–32)
Calcium: 8.3 mg/dL — ABNORMAL LOW (ref 8.9–10.3)
Chloride: 94 mmol/L — ABNORMAL LOW (ref 98–111)
Creatinine, Ser: 4.89 mg/dL — ABNORMAL HIGH (ref 0.61–1.24)
GFR, Estimated: 12 mL/min — ABNORMAL LOW (ref >60)
Glucose, Bld: 131 mg/dL — ABNORMAL HIGH (ref 70–99)
Potassium: 5.6 mmol/L — ABNORMAL HIGH (ref 3.5–5.1)
Sodium: 132 mmol/L — ABNORMAL LOW (ref 135–145)
Total Bilirubin: 2.4 mg/dL — ABNORMAL HIGH (ref 0.3–1.2)
Total Protein: 4.9 g/dL — ABNORMAL LOW (ref 6.5–8.1)

## 2022-08-08 LAB — CBC WITH DIFFERENTIAL/PLATELET
Abs Immature Granulocytes: 0.06 10*3/uL (ref 0.00–0.07)
Basophils Absolute: 0 10*3/uL (ref 0.0–0.1)
Basophils Relative: 0 %
Eosinophils Absolute: 0 10*3/uL (ref 0.0–0.5)
Eosinophils Relative: 0 %
HCT: 23.2 % — ABNORMAL LOW (ref 39.0–52.0)
Hemoglobin: 7.3 g/dL — ABNORMAL LOW (ref 13.0–17.0)
Immature Granulocytes: 1 %
Lymphocytes Relative: 9 %
Lymphs Abs: 0.6 10*3/uL — ABNORMAL LOW (ref 0.7–4.0)
MCH: 27.7 pg (ref 26.0–34.0)
MCHC: 31.5 g/dL (ref 30.0–36.0)
MCV: 87.9 fL (ref 80.0–100.0)
Monocytes Absolute: 0.3 10*3/uL (ref 0.1–1.0)
Monocytes Relative: 4 %
Neutro Abs: 5.5 10*3/uL (ref 1.7–7.7)
Neutrophils Relative %: 86 %
Platelets: 42 10*3/uL — ABNORMAL LOW (ref 150–400)
RBC: 2.64 MIL/uL — ABNORMAL LOW (ref 4.22–5.81)
RDW: 18.6 % — ABNORMAL HIGH (ref 11.5–15.5)
WBC: 6.4 10*3/uL (ref 4.0–10.5)
nRBC: 0 % (ref 0.0–0.2)

## 2022-08-08 LAB — MAGNESIUM: Magnesium: 2.9 mg/dL — ABNORMAL HIGH (ref 1.7–2.4)

## 2022-08-08 LAB — RASBURICASE - URIC ACID: Uric Acid, Serum: 9.6 mg/dL — ABNORMAL HIGH (ref 3.7–8.6)

## 2022-08-08 LAB — GLUCOSE, CAPILLARY
Glucose-Capillary: 141 mg/dL — ABNORMAL HIGH (ref 70–99)
Glucose-Capillary: 141 mg/dL — ABNORMAL HIGH (ref 70–99)

## 2022-08-08 LAB — PHOSPHORUS: Phosphorus: 11.6 mg/dL — ABNORMAL HIGH (ref 2.5–4.6)

## 2022-08-08 LAB — CULTURE, RESPIRATORY W GRAM STAIN

## 2022-08-08 MED ORDER — DOCUSATE SODIUM 50 MG/5ML PO LIQD
100.0000 mg | Freq: Two times a day (BID) | ORAL | Status: DC
  Administered 2022-08-13 – 2022-08-14 (×2): 100 mg
  Filled 2022-08-08 (×4): qty 10

## 2022-08-08 MED ORDER — NOREPINEPHRINE 4 MG/250ML-% IV SOLN
0.0000 ug/min | INTRAVENOUS | Status: DC
  Administered 2022-08-08: 10 ug/min via INTRAVENOUS
  Administered 2022-08-09: 8 ug/min via INTRAVENOUS
  Administered 2022-08-09: 7 ug/min via INTRAVENOUS
  Administered 2022-08-09: 6 ug/min via INTRAVENOUS
  Administered 2022-08-10: 4 ug/min via INTRAVENOUS
  Filled 2022-08-08 (×5): qty 250

## 2022-08-08 MED ORDER — TBO-FILGRASTIM 480 MCG/0.8ML ~~LOC~~ SOSY
480.0000 ug | PREFILLED_SYRINGE | Freq: Every day | SUBCUTANEOUS | Status: AC
  Administered 2022-08-08 – 2022-08-14 (×7): 480 ug via SUBCUTANEOUS
  Filled 2022-08-08 (×7): qty 0.8

## 2022-08-08 MED ORDER — MIDAZOLAM HCL 2 MG/2ML IJ SOLN
INTRAMUSCULAR | Status: AC
  Administered 2022-08-08: 2 mg
  Filled 2022-08-08: qty 2

## 2022-08-08 MED ORDER — ETOMIDATE 2 MG/ML IV SOLN
INTRAVENOUS | Status: AC
  Administered 2022-08-08: 10 mg
  Filled 2022-08-08: qty 20

## 2022-08-08 MED ORDER — HEPARIN SODIUM (PORCINE) 1000 UNIT/ML IJ SOLN
1000.0000 [IU] | INTRAMUSCULAR | Status: DC | PRN
  Administered 2022-08-15: 2400 [IU]
  Filled 2022-08-08: qty 5

## 2022-08-08 MED ORDER — FENTANYL CITRATE PF 50 MCG/ML IJ SOSY
25.0000 ug | PREFILLED_SYRINGE | INTRAMUSCULAR | Status: DC | PRN
  Administered 2022-08-08: 50 ug via INTRAVENOUS
  Administered 2022-08-08 (×2): 100 ug via INTRAVENOUS
  Administered 2022-08-08 – 2022-08-10 (×9): 50 ug via INTRAVENOUS
  Administered 2022-08-10: 100 ug via INTRAVENOUS
  Administered 2022-08-10 – 2022-08-11 (×6): 75 ug via INTRAVENOUS
  Filled 2022-08-08 (×3): qty 2

## 2022-08-08 MED ORDER — SODIUM CHLORIDE 0.9 % IV SOLN
INTRAVENOUS | Status: DC | PRN
  Administered 2022-08-12: 10 mL/h via INTRAVENOUS

## 2022-08-08 MED ORDER — ORAL CARE MOUTH RINSE
15.0000 mL | OROMUCOSAL | Status: DC
  Administered 2022-08-08 – 2022-08-11 (×34): 15 mL via OROMUCOSAL

## 2022-08-08 MED ORDER — ROCURONIUM BROMIDE 10 MG/ML (PF) SYRINGE
PREFILLED_SYRINGE | INTRAVENOUS | Status: AC
  Filled 2022-08-08: qty 10

## 2022-08-08 MED ORDER — PANTOPRAZOLE SODIUM 40 MG IV SOLR
40.0000 mg | Freq: Every day | INTRAVENOUS | Status: DC
  Administered 2022-08-08 – 2022-08-20 (×13): 40 mg via INTRAVENOUS
  Filled 2022-08-08 (×13): qty 10

## 2022-08-08 MED ORDER — ORAL CARE MOUTH RINSE: 15.0000 mL | OROMUCOSAL | Status: DC | PRN

## 2022-08-08 MED ORDER — NOREPINEPHRINE 4 MG/250ML-% IV SOLN
INTRAVENOUS | Status: AC
  Administered 2022-08-08: 7 ug/min via INTRAVENOUS
  Filled 2022-08-08: qty 250

## 2022-08-08 MED ORDER — MIDAZOLAM HCL 2 MG/2ML IJ SOLN
1.0000 mg | INTRAMUSCULAR | Status: DC | PRN
  Administered 2022-08-08 – 2022-08-10 (×6): 2 mg via INTRAVENOUS
  Filled 2022-08-08 (×8): qty 2

## 2022-08-08 MED ORDER — FENTANYL CITRATE (PF) 100 MCG/2ML IJ SOLN
INTRAMUSCULAR | Status: AC
  Filled 2022-08-08: qty 2

## 2022-08-08 MED ORDER — FENTANYL 2500MCG IN NS 250ML (10MCG/ML) PREMIX INFUSION
0.0000 ug/h | INTRAVENOUS | Status: DC
  Administered 2022-08-08: 25 ug/h via INTRAVENOUS
  Administered 2022-08-10: 50 ug/h via INTRAVENOUS
  Filled 2022-08-08 (×2): qty 250

## 2022-08-08 MED ORDER — SODIUM CHLORIDE 0.9 % IV SOLN
6.0000 mg | Freq: Once | INTRAVENOUS | Status: AC
  Administered 2022-08-08: 6 mg via INTRAVENOUS
  Filled 2022-08-08: qty 4

## 2022-08-08 MED ORDER — PRISMASOL BGK 0/2.5 32-2.5 MEQ/L EC SOLN
Status: DC
  Filled 2022-08-08 (×3): qty 5000

## 2022-08-08 MED ORDER — PRISMASOL BGK 0/2.5 32-2.5 MEQ/L EC SOLN
Status: DC
  Filled 2022-08-08 (×11): qty 5000

## 2022-08-08 MED ORDER — DEXMEDETOMIDINE HCL IN NACL 200 MCG/50ML IV SOLN
0.0000 ug/kg/h | INTRAVENOUS | Status: DC
  Administered 2022-08-08 (×2): 0.4 ug/kg/h via INTRAVENOUS
  Administered 2022-08-08: 0.6 ug/kg/h via INTRAVENOUS
  Administered 2022-08-09: 1.2 ug/kg/h via INTRAVENOUS
  Filled 2022-08-08 (×3): qty 50
  Filled 2022-08-08: qty 100

## 2022-08-08 MED ORDER — VITAL HIGH PROTEIN PO LIQD
1000.0000 mL | ORAL | Status: DC
  Administered 2022-08-08: 1000 mL

## 2022-08-08 MED ORDER — POLYETHYLENE GLYCOL 3350 17 G PO PACK: 17.0000 g | PACK | Freq: Every day | ORAL | Status: DC

## 2022-08-08 MED ORDER — CHLORHEXIDINE GLUCONATE CLOTH 2 % EX PADS
6.0000 | MEDICATED_PAD | Freq: Every day | CUTANEOUS | Status: DC
  Administered 2022-08-08 – 2022-08-15 (×8): 6 via TOPICAL

## 2022-08-08 MED ORDER — PIPERACILLIN-TAZOBACTAM 3.375 G IVPB 30 MIN
3.3750 g | Freq: Four times a day (QID) | INTRAVENOUS | Status: AC
  Administered 2022-08-08 – 2022-08-13 (×20): 3.375 g via INTRAVENOUS
  Filled 2022-08-08 (×37): qty 50

## 2022-08-08 NOTE — Progress Notes (Addendum)
[name]  HEMATOLOGY/ONCOLOGY INPATIENT PROGRESS NOTE  Date of Service: 08/08/2022  Inpatient Attending: .Coralyn Helling, MD  SUBJECTIVE  Patient was seen in oncology follow-up today.  He was apparently having respiratory distress and a lot of increased respiratory secretions that he was having difficulty managing and due to concerns for recurrent aspirations was intubated today and is on a vent. Renal function continues to worsening and Uric acid was still elevated and he received 2nd dose of Rasburicase. Hgb was downt o 7.1 and 1 unit of PRBC was ordered. He is being started on CRRT per nephrology.  OBJECTIVE:  NAD  PHYSICAL EXAMINATION: . Vitals:   08/08/22 0505 08/08/22 0600 08/08/22 0700 08/08/22 0820  BP: 125/72 112/66 104/68   Pulse: 78 78 76   Resp: 11 (!) 8 20   Temp:    (!) 97.5 F (36.4 C)  TempSrc:    Oral  SpO2: 95% 98% 96%   Weight:      Height:       Filed Weights   08/06/22 1726 08/07/22 0500 08/08/22 0500  Weight: 198 lb 13.7 oz (90.2 kg) 200 lb 6.4 oz (90.9 kg) 210 lb 1.6 oz (95.3 kg)   .Body mass index is 32.91 kg/m. [name] GENERAL:intubated sedated on vent LUNGS: clear to auscultation b/l with normal respiratory effort HEART: regular rate & rhythm ABDOMEN:  normoactive bowel sounds , non tender, not distended. Extremity: 1+ b/l pedal edema   MEDICAL HISTORY:  Past Medical History:  Diagnosis Date   Cancer [facility])    prostate   Follicular lymphoma (HCC) 2024   History of pulmonary embolism    Hypertension    Myelodysplastic syndrome (HCC)    PAF (paroxysmal atrial fibrillation) (HCC)    Recurrent UTI     SURGICAL HISTORY: Past Surgical History:  Procedure Laterality Date   LYMPHADENECTOMY Bilateral 09/01/2019   Procedure: LYMPHADENECTOMY;  Surgeon: Sebastian Ache, MD;  Location: [facility];  Service: Urology;  Laterality: Bilateral;   ROBOT ASSISTED LAPAROSCOPIC RADICAL PROSTATECTOMY N/A 09/01/2019   Procedure: XI ROBOTIC ASSISTED LAPAROSCOPIC RADICAL  PROSTATECTOMY;  Surgeon: Sebastian Ache, MD;  Location: [facility];  Service: Urology;  Laterality: N/A;  3 HRS    SOCIAL HISTORY: Social History   Socioeconomic History   Marital status: Married    Spouse name: Not on file   Number of children: Not on file   Years of education: Not on file   Highest education level: Not on file  Occupational History   Not on file  Tobacco Use   Smoking status: Never   Smokeless tobacco: Never  Vaping Use   Vaping Use: Never used  Substance and Sexual Activity   Alcohol use: Yes    Comment: at least one 40 oz beer daily   Drug use: Never   Sexual activity: Not on file  Other Topics Concern   Not on file  Social History Narrative   Not on file   Social Determinants of Health   Financial Resource Strain: Not on file  Food Insecurity: No Food Insecurity (07/30/2022)   Hunger Vital Sign    Worried About Running Out of Food in the Last Year: Never true    Ran Out of Food in the Last Year: Never true  Transportation Needs: No Transportation Needs (07/30/2022)   PRAPARE - Administrator, Civil Service (Medical): No    Lack of Transportation (Non-Medical): No  Physical Activity: Not on file  Stress: Not on file  Social Connections: Not  on file  Intimate Partner Violence: Not At Risk (07/30/2022)   Humiliation, Afraid, Rape, and Kick questionnaire    Fear of Current or Ex-Partner: No    Emotionally Abused: No    Physically Abused: No    Sexually Abused: No    FAMILY HISTORY: Family History  Problem Relation Age of Onset   Stroke Father     ALLERGIES:  is allergic to amitriptyline, amlodipine, and neurontin [gabapentin].  MEDICATIONS:  Scheduled Meds:  acetaminophen  650 mg Per Tube Once   allopurinol  100 mg Per Tube BID   amiodarone  400 mg Per NG tube BID   Followed by   Melene Muller ON 08/10/2022] amiodarone  200 mg Per Tube Daily   Chlorhexidine Gluconate Cloth  6 each Topical Q2200   docusate  100 mg Per Tube BID    methylPREDNISolone (SOLU-MEDROL) injection  80 mg Intravenous Daily   multivitamin with minerals  1 tablet Per Tube Daily   mouth rinse  15 mL Mouth Rinse Q2H   pantoprazole (PROTONIX) IV  40 mg Intravenous Daily   polyethylene glycol  17 g Per Tube Daily   senna  2 tablet Per NG tube Daily   sodium chloride flush  10-40 mL Intracatheter Q12H   Tbo-filgastrim (GRANIX) SQ  480 mcg Subcutaneous q1800   Continuous Infusions:  dexmedetomidine (PRECEDEX) IV infusion     norepinephrine     norepinephrine (LEVOPHED) Adult infusion     prismasol BGK 2/2.5 dialysis solution     prismasol BGK 2/2.5 replacement solution     prismasol BGK 2/2.5 replacement solution     rasburicase (ELITEK) 6 mg in sodium chloride 0.9 % 46 mL IVPB     sodium bicarbonate 150 mEq in sterile water 1,150 mL infusion 50 mL/hr at 08/08/22 0400   PRN Meds:.chlorproMAZINE, docusate sodium, fentaNYL (SUBLIMAZE) injection, heparin sodium (porcine), ipratropium-albuterol, lip balm, midazolam, norepinephrine, ondansetron **OR** ondansetron (ZOFRAN) IV, mouth rinse, sodium chloride flush  REVIEW OF SYSTEMS:    10 Point review of Systems was done is negative except as noted above.   LABORATORY DATA:  I have reviewed the data as listed .CBC    Component Value Date/Time   WBC 6.4 08/08/2022 0355   RBC 2.64 (L) 08/08/2022 0355   HGB 7.3 (L) 08/08/2022 0355   HGB 8.1 (L) 07/19/2022 1432   HCT 23.2 (L) 08/08/2022 0355   PLT 42 (L) 08/08/2022 0355   PLT 226 07/19/2022 1432   MCV 87.9 08/08/2022 0355   MCH 27.7 08/08/2022 0355   MCHC 31.5 08/08/2022 0355   RDW 18.6 (H) 08/08/2022 0355   LYMPHSABS 0.6 (L) 08/08/2022 0355   MONOABS 0.3 08/08/2022 0355   EOSABS 0.0 08/08/2022 0355   BASOSABS 0.0 08/08/2022 0355   .    Latest Ref Rng & Units 08/08/2022    3:55 AM 08/07/2022    6:01 AM 08/06/2022   11:07 AM  CBC  WBC 4.0 - 10.5 K/uL 6.4  7.7    Hemoglobin 13.0 - 17.0 g/dL 7.3  7.5  7.7   Hematocrit 39.0 - 52.0 % 23.2   24.3  24.3   Platelets 150 - 400 K/uL 42  60     .    Latest Ref Rng & Units 08/08/2022    3:55 AM 08/07/2022    6:01 AM 08/06/2022    4:21 AM  CMP  Glucose 70 - 99 mg/dL 161  096  88   BUN 8 - 23 mg/dL 97  73  60   Creatinine 0.61 - 1.24 mg/dL 1.61  0.96  0.45   Sodium 135 - 145 mmol/L 132  128  128   Potassium 3.5 - 5.1 mmol/L 5.6  5.1  5.7   Chloride 98 - 111 mmol/L 94  95  98   CO2 22 - 32 mmol/L 22  18  19    Calcium 8.9 - 10.3 mg/dL 8.3  8.7  9.0   Total Protein 6.5 - 8.1 g/dL 4.9  4.9  4.7   Total Bilirubin 0.3 - 1.2 mg/dL 2.4  2.8  2.3   Alkaline Phos 38 - 126 U/L 84  88  96   AST 15 - 41 U/L 63  67  65   ALT 0 - 44 U/L 34  39  37      RADIOGRAPHIC STUDIES: I have personally reviewed the radiological images as listed and agreed with the findings in the report. DG Abd Portable 1V  Result Date: 08/08/2022 CLINICAL DATA:  Ileus EXAM: PORTABLE ABDOMEN - 1 VIEW COMPARISON:  One-view abdomen 08/06/2022 FINDINGS: The bowel gas pattern is now normal. No residual distended bowel is present. Side port of the NG tube is in the stomach. No radio-opaque calculi or other significant radiographic abnormality are seen. IMPRESSION: 1. Resolution of ileus. 2. Side port of the NG tube is in the stomach. Electronically Signed   By: Marin Roberts M.D.   On: 08/08/2022 09:31   DG Abd 1 View  Result Date: 08/06/2022 CLINICAL DATA:  Abdominal distension. EXAM: ABDOMEN - 1 VIEW COMPARISON:  Radiograph yesterday FINDINGS: Tip and side port of the enteric tube below the diaphragm in the stomach. No small bowel distension or evidence of obstruction. There is mild gaseous distension of transverse and descending colon. Small volume of formed stool in the right colon. No radiopaque calculi. No acute osseous abnormalities are seen. IMPRESSION: 1. Tip and side port of the enteric tube below the diaphragm in the stomach. 2. Mild gaseous distension of transverse and descending colon which may represent  colonic ileus. No small bowel obstruction. Electronically Signed   By: Narda Rutherford M.D.   On: 08/06/2022 18:14   US RENAL  Result Date: 08/06/2022 CLINICAL DATA:  Acute kidney injury. EXAM: RENAL / URINARY TRACT ULTRASOUND COMPLETE COMPARISON:  CT 07/29/2022 FINDINGS: Right Kidney: Renal measurements: 10 x 5.3 x 6.6 cm = volume: 183 mL. No hydronephrosis. Normal parenchymal echogenicity. 3.6 cm cyst arises from the lower pole. This needs no further imaging follow-up. No visible stone or solid lesion. Additional cyst on prior CT not well seen. Left Kidney: Renal measurements: 10.8 x 5.6 x 5.3 cm = volume: 167 mL. No hydronephrosis. Normal parenchymal echogenicity. 2.9 cm cyst in the lower pole. Some of the smaller cysts on CT are not seen by ultrasound. No visible stone or solid lesion. Bladder: Not seen, may be empty. Other: None. IMPRESSION: No obstructive uropathy. Electronically Signed   By: Narda Rutherford M.D.   On: 08/06/2022 18:12   DG Abd 1 View  Result Date: 08/05/2022 CLINICAL DATA:  Nasogastric tube placement. EXAM: ABDOMEN - 1 VIEW COMPARISON:  August 05, 2022 FINDINGS: A nasogastric tube is seen with its distal tip overlying the expected region of the gastric antrum. A small amount of radiopaque contrast is seen overlying the expected region of the gastric fundus. The bowel gas pattern is normal. No radio-opaque calculi are seen. IMPRESSION: Nasogastric tube positioning, as described above. Electronically Signed   By: Waylan Rocher  [place] M.D.   On: 08/05/2022 19:05   DG Abd 1 View  Result Date: 08/05/2022 CLINICAL DATA:  [id number] Vomiting [id number] EXAM: ABDOMEN - 1 VIEW COMPARISON:  08/01/2022 FINDINGS: The bowel gas pattern is normal. residual oral contrastIn the gastric fundus. No radio-opaque calculi or other significant radiographic abnormality are seen. IMPRESSION: Negative. Electronically Signed   By: Corlis Leak M.D.   On: 08/05/2022 17:02   DG Chest Port 1V same Day  Result Date:  08/05/2022 CLINICAL DATA:  Dyspnea EXAM: PORTABLE CHEST 1 VIEW COMPARISON:  08/03/2022 FINDINGS: Cardiac shadow is within normal limits. Persistent widening of the superior mediastinum is noted related to lymphadenopathy. Lungs are clear bilaterally. No bony abnormality is noted. IMPRESSION: Persistent lymphadenopathy with mediastinal widening. No other focal abnormality is seen. Electronically Signed   By: Alcide Clever M.D.   On: 08/05/2022 11:11   [place] EKG SITE RITE  Result Date: 08/05/2022 If Site Rite image not attached, placement could not be confirmed due to current cardiac rhythm.  CT CHEST W CONTRAST  Result Date: 08/03/2022 CLINICAL DATA:  Inpatient. Abnormal chest radiograph. Chest wall mass. * Tracking Code: BO * EXAM: CT CHEST WITH CONTRAST TECHNIQUE: Multidetector CT imaging of the chest was performed during intravenous contrast administration. RADIATION DOSE REDUCTION: This exam was performed according to the departmental dose-optimization program which includes automated exposure control, adjustment of the mA and/or kV according to patient size and/or use of iterative reconstruction technique. CONTRAST:  75mL OMNIPAQUE IOHEXOL 300 MG/ML  SOLN COMPARISON:  Chest radiograph from one day prior. 07/07/2022 chest CT. FINDINGS: Cardiovascular: Normal heart size. No significant pericardial effusion/thickening. Left anterior descending coronary atherosclerosis. Great vessels are normal in course and caliber. No central pulmonary emboli. Mediastinum/Nodes: No significant thyroid nodules. Unremarkable esophagus. Bulky bilateral axillary adenopathy, increased from 07/07/2022. Representative short axis diameter 3.8 cm right axillary node (series 3/image 57), previously 2.7 cm. Bilateral bulky lower neck adenopathy is increased. Representative left lower neck 3.0 cm node (series 3/image 6), previously 1.8 cm. Bulky bilateral mediastinal adenopathy in the prevascular, AP window, right paratracheal and  subcarinal chains, increased. Representative 3.0 cm right paratracheal node (series 3/image 53), previously 1.8 cm. Increased bilateral hilar adenopathy. Representative 2.0 cm left hilar node (series 3/image 77), previously 1.2 cm. Lungs/Pleura: No pneumothorax. Small dependent bilateral pleural effusions with associated compressive mild-to-moderate atelectasis in the posterior lower lobes bilaterally, new. No lung masses or significant pulmonary nodules in the aerated portions of the lungs. Upper abdomen: Multiple splenic masses, for example 3.0 cm medially (series 3/image 131), not previously discretely visualized. Bulky upper abdominal lymphadenopathy in the gastrohepatic ligament, porta hepatis, portacaval, aortocaval and left para-aortic chains, increased. Representative 2.1 cm gastrohepatic ligament node (series 3/image 130), previously 1.8 cm. Representative 2.0 cm left para-aortic node (series 3/image 155), previously 1.3 cm. Interval growth of multiple upper omental soft tissue nodules, largest 3.5 x 2.2 cm (series 3/image 153), previously 2.0 x 1.2 cm. Musculoskeletal: No aggressive appearing focal osseous lesions. Moderate thoracic spondylosis. IMPRESSION: 1. Rapid interval progression of bulky bilateral axillary, bilateral hilar, bilateral mediastinal, bilateral lower neck and upper abdominal lymphadenopathy since recent 07/07/2022 chest CT. Multiple new splenic masses. Enlarging upper omental soft tissue nodules. 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 The axillary adenopathy should be amenable to percutaneous biopsy. 2. Small dependent bilateral pleural effusions with associated compressive mild-to-moderate posterior lower lobe atelectasis, new. 3. One vessel coronary atherosclerosis. Electronically Signed  By: Delbert Phenix M.D.   On: 08/03/2022 12:02   DG Chest Port 1V same Day  Result  Date: 08/02/2022 CLINICAL DATA:  Dyspnea shortness of breath EXAM: PORTABLE CHEST 1 VIEW COMPARISON:  Multiple exams, including 07/31/2022 FINDINGS: Substantially widened mediastinum at 14.9 cm presumably due to adenopathy given findings on prior imaging workups. This is stable compared to 07/31/2022 and substantially increased from 05/23/2022, and questionably increased from 07/06/2022 (AP projection on today's exam may be magnify the mediastinum more than prior PA projections and CT imaging). Heart size within normal limits. Hazy indistinct density at the left lung base possibly from atelectasis or pneumonia, minimally improved from previous. IMPRESSION: 1. Substantially widened mediastinum at 14.9 cm, presumably due to adenopathy given previous chest CT findings. 2. Hazy indistinct density at the left lung base possibly from atelectasis or pneumonia, minimally improved from previous. Electronically Signed   By: Gaylyn Rong M.D.   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 intake, tachycardia and episode of shaking and unresponsiveness then confused thereafter. EEG to evaluate for seizure Level of alertness: Awake AEDs during EEG study: None Technical aspects: This EEG study was done with scalp electrodes positioned according to the 10-20 International system of electrode placement. Electrical activity was reviewed with band pass filter of 1-70Hz , sensitivity of 7 uV/mm, display speed of 27mm/sec with a 60Hz  notched filter applied as appropriate. EEG data were recorded continuously and digitally stored.  Video monitoring was available and reviewed as  appropriate. Description: The posterior dominant rhythm consists of 7.5 Hz activity of moderate voltage (25-35 uV) seen predominantly in posterior head regions, symmetric and reactive to eye opening and eye closing. EEG showed intermittent generalized sharply contoured high amplitude 2-3hz  delta slowing. Hyperventilation and photic stimulation were not performed.   ABNORMALITY - Intermittent slow, generalized IMPRESSION: This study is suggestive of mild diffuse encephalopathy, nonspecific etiology. No seizures or epileptiform discharges were seen throughout the recording.  If suspicion for interictal activity remains a concern, a prolonged study can be considered. Charlsie Quest   DG Abd 1 View  Result Date: 08/01/2022 CLINICAL DATA:  Abdominal distension. EXAM: ABDOMEN - 1 VIEW COMPARISON:  CT AP 07/29/2022 FINDINGS: No dilated loops of large or small bowel identified. Stool noted within the colon. No abnormal abdominal or pelvic calcifications. Visualized osseous structures appear intact. IMPRESSION: Nonobstructive bowel gas pattern. Electronically Signed   By: Signa Kell M.D.   On: 08/01/2022 12:41   DG Chest Port 1 View  Result Date: 07/31/2022 CLINICAL DATA:  Possible aspiration EXAM: PORTABLE CHEST 1 VIEW COMPARISON:  07/29/2022, 07/07/2022 FINDINGS: Cardiac shadow is stable. Widened mediastinum is again identified accentuated by the portable technique. This is consistent with the known mediastinal adenopathy seen on prior CT. Lungs are well aerated bilaterally. Mild bibasilar atelectasis is seen. No sizable effusion is noted. No bony abnormality is seen. IMPRESSION: Widened mediastinum consistent with the known mediastinal adenopathy. Mild bibasilar atelectasis. Electronically Signed   By: Alcide Clever M.D.   On: 07/31/2022 23:06   CT MAXILLOFACIAL WO CONTRAST  Result Date: 07/30/2022 CLINICAL DATA:  Right facial pain EXAM: CT MAXILLOFACIAL WITHOUT CONTRAST TECHNIQUE: Multidetector  CT imaging  of the maxillofacial structures was performed. Multiplanar CT image reconstructions were also generated. RADIATION DOSE REDUCTION: This exam was performed according to the departmental dose-optimization program which includes automated exposure control, adjustment of the mA and/or kV according to patient size and/or use of iterative reconstruction technique. COMPARISON:  None Available. FINDINGS: Osseous: No fracture or mandibular dislocation. No destructive process. Large periapical lucency at the root of the most posterior remaining right maxillary 2. Orbits: Negative. No traumatic or inflammatory finding. Sinuses: Right anterior ethmoid and maxillary sinus complete opacification with polypoid contours. Soft tissues: Multiple enlarged submandibular lymph nodes, measuring up to 1.6 cm. No abscess or fluid collection. Limited intracranial: No significant or unexpected finding. IMPRESSION: 1. Large periapical lucency at the root of the most posterior remaining right maxillary tooth. No associated abscess. 2. Right anterior ethmoid and maxillary sinus complete opacification with polypoid contours, possibly antrochoanal polyp. 3. Multiple enlarged submandibular lymph nodes, measuring up to 1.6 cm, likely reactive. Electronically Signed   By: Deatra Robinson M.D.   On: 07/30/2022 20:38   MR BRAIN W WO CONTRAST  Result Date: 07/30/2022 CLINICAL DATA:  Seizure and headache EXAM: MRI HEAD WITHOUT AND WITH CONTRAST TECHNIQUE: Multiplanar, multiecho pulse sequences of the brain and surrounding structures were obtained without and with intravenous contrast. CONTRAST:  9mL GADAVIST GADOBUTROL 1 MMOL/ML IV SOLN COMPARISON:  None Available. FINDINGS: Brain: No acute infarct, mass effect or extra-axial collection. No acute or chronic hemorrhage. There is multifocal hyperintense T2-weighted signal within the white matter. Parenchymal volume and CSF spaces are normal. The midline structures are normal. There is no abnormal  contrast enhancement. Vascular: Major flow voids are preserved. Skull and upper cervical spine: Normal calvarium and skull base. Visualized upper cervical spine and soft tissues are normal. Sinuses/Orbits:Opacification of the right maxillary and right anterior ethmoid sinuses. Normal orbits. IMPRESSION: 1. No acute intracranial abnormality. 2. Multifocal hyperintense T2-weighted signal within the white matter, most consistent with chronic small vessel ischemia. 3. Polypoid lesion of the right maxillary and ethmoid sinuses, possibly antrochoanal polyp. Nonemergent ENT consultation might be helpful. Electronically Signed   By: Deatra Robinson M.D.   On: 07/30/2022 20:34   CT ABDOMEN PELVIS WO CONTRAST  Result Date: 07/29/2022 CLINICAL DATA:  Acute abdominal pain. EXAM: CT ABDOMEN AND PELVIS WITHOUT CONTRAST TECHNIQUE: Multidetector CT imaging of the abdomen and pelvis was performed following the standard protocol without IV contrast. RADIATION DOSE REDUCTION: This exam was performed according to the departmental dose-optimization program which includes automated exposure control, adjustment of the mA and/or kV according to patient size and/or use of iterative reconstruction technique. COMPARISON:  CT examination dated Jul 15, 2022 FINDINGS: Lower chest: Bibasilar subsegmental atelectasis. Hepatobiliary: No focal liver abnormality is seen. No gallstones, gallbladder wall thickening, or biliary dilatation. Pancreas: Mild generalized pancreatic atrophy. No pancreatic ductal dilatation or surrounding inflammatory changes. Spleen: Normal in size without focal abnormality. Adrenals/Urinary Tract: Adrenal glands are unremarkable. Kidneys are normal, without renal calculi, focal lesion, or hydronephrosis. Bilateral exophytic renal cysts, likely simple cysts. Bladder is unremarkable. Stomach/Bowel: Stomach is within normal limits. Appendix appears normal. Scattered colonic diverticulosis without evidence of acute  diverticulitis. No evidence of bowel wall thickening, distention, or inflammatory changes. Vascular/Lymphatic: No significant vascular findings are present. There numerous lymph nodes of multiple sizes which are para-aortic, mesenteric, porta hepatis as well as multiple lymph nodes about the spleen as well as few lymph nodes in the pelvis. These are not significantly changed since prior examination. Reproductive: Postsurgical  changes in the prostate bed. Other: Scattered soft tissue densities in the anterior abdominal wall, which may represent lymph nodes. No abdominopelvic ascites. Musculoskeletal: Compression fracture of the L4 vertebral body is unchanged. No acute osseous abnormality. IMPRESSION: 1. No CT evidence of acute abdominal/pelvic process. 2. Scattered colonic diverticulosis without evidence of acute diverticulitis. 3. Numerous lymph nodes of multiple sizes are present throughout the abdomen and pelvis predominantly in the mesentery and para-aortic region, which are not significantly changed since prior examination. 4. Chronic compression fracture of the L4 vertebral body. Electronically Signed   By: Larose Hires D.O.   On: 07/29/2022 19:26   CT Head Wo Contrast  Result Date: 07/29/2022 CLINICAL DATA:  Seizures, new onset. EXAM: CT HEAD WITHOUT CONTRAST TECHNIQUE: Contiguous axial images were obtained from the base of the skull through the vertex without intravenous contrast. RADIATION DOSE REDUCTION: This exam was performed according to the departmental dose-optimization program which includes automated exposure control, adjustment of the mA and/or kV according to patient size and/or use of iterative reconstruction technique. COMPARISON:  None Available. FINDINGS: Brain: No evidence of acute infarction, hemorrhage, hydrocephalus, extra-axial collection or mass lesion/mass effect. Vascular: No hyperdense vessel or unexpected calcification. Skull: Normal. Negative for fracture or focal lesion.  Sinuses/Orbits: Mucosal thickening of the ethmoid air cells. Other: None. IMPRESSION: 1. No acute intracranial abnormality. 2. Mucosal thickening of the ethmoid air cells. Electronically Signed   By: Larose Hires D.O.   On: 07/29/2022 19:15   ECHOCARDIOGRAM COMPLETE  Result Date: 07/29/2022    ECHOCARDIOGRAM REPORT   Patient Name:   Bodi Lumbard Date of Exam: 07/29/2022 Medical Rec #:  [id number]    Height:       67.0 in Accession #:    [id number]   Weight:       185.9 lb Date of Birth:  September 07, 1950    BSA:          1.960 m Patient Age:    72 years     BP:           111/73 mmHg Patient Gender: M            HR:           117 bpm. Exam Location:  Outpatient Procedure: 2D Echo, Color Doppler and Cardiac Doppler Indications:    Chemo  History:        Patient has prior history of Echocardiogram examinations, most                 recent 12/16/2021. Risk Factors:Hypertension. Prostate Cancer,                 Lymphoma, Chemotherapy.  Sonographer:    Milbert Coulter Referring Phys: Johney Maine  Sonographer Comments: Image acquisition challenging due to respiratory motion and Tachycardia. IMPRESSIONS  1. Intracavitary gradient is realted to dynamic function. Left ventricular ejection fraction, by estimation, is >75%. The left ventricle has hyperdynamic function. The left ventricle has no regional wall motion abnormalities. There is mild concentric left ventricular hypertrophy. Left ventricular diastolic parameters were normal.  2. Right ventricular systolic function is hyperdynamic. The right ventricular size is normal. Tricuspid regurgitation signal is inadequate for assessing PA pressure.  3. The mitral valve is normal in structure. No evidence of mitral valve regurgitation.  4. The aortic valve is tricuspid. There is mild calcification of the aortic valve. Aortic valve regurgitation is not visualized. No aortic stenosis is present. Comparison(s): No significant change from prior study. FINDINGS  Left Ventricle:  Intracavitary gradient is realted to dynamic function. Left ventricular ejection fraction, by estimation, is >75%. The left ventricle has hyperdynamic function. The left ventricle has no regional wall motion abnormalities. The left ventricular internal cavity size was small. There is mild concentric left ventricular hypertrophy. Left ventricular diastolic parameters were normal. Right Ventricle: The right ventricular size is normal. No increase in right ventricular wall thickness. Right ventricular systolic function is hyperdynamic. Tricuspid regurgitation signal is inadequate for assessing PA pressure. Left Atrium: Left atrial size was normal in size. Right Atrium: Right atrial size was normal in size. Pericardium: Trivial pericardial effusion is present. The pericardial effusion is posterior to the left ventricle. Mitral Valve: The mitral valve is normal in structure. No evidence of mitral valve regurgitation. Tricuspid Valve: The tricuspid valve is normal in structure. Tricuspid valve regurgitation is not demonstrated. Aortic Valve: The aortic valve is tricuspid. There is mild calcification of the aortic valve. Aortic valve regurgitation is not visualized. No aortic stenosis is present. Aortic valve mean gradient measures 10.0 mmHg. Aortic valve peak gradient measures 18.5 mmHg. Aortic valve area, by VTI measures 2.53 cm. Pulmonic Valve: The pulmonic valve was not well visualized. Pulmonic valve regurgitation is not visualized. Aorta: The aortic root and ascending aorta are structurally normal, with no evidence of dilitation. IAS/Shunts: The interatrial septum was not well visualized.  LEFT VENTRICLE PLAX 2D LVIDd:         4.00 cm     Diastology LVIDs:         1.80 cm     LV e' medial:    8.24 cm/s LV PW:         1.10 cm     LV E/e' medial:  7.5 LV IVS:        1.20 cm     LV e' lateral:   11.70 cm/s LVOT diam:     2.10 cm     LV E/e' lateral: 5.3 LV SV:         72 LV SV Index:   37 LVOT Area:     3.46 cm  LV  Volumes (MOD) LV vol d, MOD A2C: 27.2 ml LV vol d, MOD A4C: 49.7 ml LV vol s, MOD A2C: 7.1 ml LV vol s, MOD A4C: 13.6 ml LV SV MOD A2C:     20.1 ml LV SV MOD A4C:     49.7 ml LV SV MOD BP:      26.5 ml RIGHT VENTRICLE TAPSE (M-mode): 2.0 cm LEFT ATRIUM             Index LA diam:        3.50 cm 1.79 cm/m LA Vol (A2C):   26.6 ml 13.57 ml/m LA Vol (A4C):   36.9 ml 18.82 ml/m LA Biplane Vol: 31.3 ml 15.97 ml/m  AORTIC VALVE AV Area (Vmax):    2.77 cm AV Area (Vmean):   2.66 cm AV Area (VTI):     2.53 cm AV Vmax:           215.00 cm/s AV Vmean:          146.000 cm/s AV VTI:            0.285 m AV Peak Grad:      18.5 mmHg AV Mean Grad:      10.0 mmHg LVOT Vmax:         172.00 cm/s LVOT Vmean:        112.000 cm/s LVOT VTI:  0.208 m LVOT/AV VTI ratio: 0.73  AORTA Ao Asc diam: 2.90 cm MITRAL VALVE MV Area (PHT): 4.49 cm    SHUNTS MV Decel Time: 169 msec    Systemic VTI:  0.21 m MV E velocity: 61.90 cm/s  Systemic Diam: 2.10 cm MV A velocity: 89.20 cm/s MV E/A ratio:  0.69 Riley Lam MD Electronically signed by Riley Lam MD Signature Date/Time: 07/29/2022/4:01:01 PM    Final    DG Chest Port 1 View  Result Date: 07/29/2022 CLINICAL DATA:  Cough.  Syncope. EXAM: PORTABLE CHEST 1 VIEW COMPARISON:  May 23, 2022.  Jul 07, 2022. FINDINGS: Prominence of the superior mediastinum is noted consistent with adenopathy as noted on prior CT scan. Stable cardiac size. Lungs are clear. Bony thorax is unremarkable. IMPRESSION: Widening of superior mediastinum consistent with adenopathy noted on prior CT scan of Jul 07, 2022. This is concerning for lymphoma or metastatic disease. Electronically Signed   By: Lupita Raider M.D.   On: 07/29/2022 15:34   [place] CORE BIOPSY (LYMPH NODES)  Result Date: 07/16/2022 INDICATION: 72 year old male referred for biopsy of axillary lymph node EXAM: ULTRASOUND-GUIDED BIOPSY AXILLARY LYMPH NODE MEDICATIONS: None. ANESTHESIA/SEDATION: None COMPLICATIONS: None  PROCEDURE: Informed written consent was obtained from the patient with interpreter after a thorough discussion of the procedural risks, benefits and alternatives. All questions were addressed. Maximal Sterile Barrier Technique was utilized including caps, mask, sterile gowns, sterile gloves, sterile drape, hand hygiene and skin antiseptic. A timeout was performed prior to the initiation of the procedure. Ultrasound survey was performed with images stored and sent to PACs. The left axillary region was prepped with chlorhexidine in a sterile fashion, and a sterile drape was applied covering the operative field. A sterile gown and sterile gloves were used for the procedure. Local anesthesia was provided with 1% Lidocaine. Ultrasound guidance was used to infiltrate the region with 1% lidocaine for local anesthesia. Four separate 16 gauge core needle biopsy were then acquired of the left axillary lymph node using ultrasound guidance. Images were stored. Final image was stored after biopsy. Patient tolerated the procedure well and remained hemodynamically stable throughout. No complications were encountered and no significant blood loss was encounter IMPRESSION: Status post ultrasound-guided biopsy of left axillary lymph node Signed, Yvone Neu. Miachel Roux, RPVI [facility] Electronically Signed   By: Gilmer Mor D.O.   On: 07/16/2022 17:07   CT ABDOMEN PELVIS W CONTRAST  Result Date: 07/15/2022 CLINICAL DATA:  Possible lymphoma. Bowel obstruction suspected. Right lower quadrant and right upper quadrant abdominal pain for 6 weeks. Nausea and vomiting. Scheduled for a lymph node biopsy tomorrow. EXAM: CT ABDOMEN AND PELVIS WITH CONTRAST TECHNIQUE: Multidetector CT imaging of the abdomen and pelvis was performed using the standard protocol following bolus administration of intravenous contrast. RADIATION DOSE REDUCTION: This exam was performed according to  the departmental dose-optimization program which includes automated exposure control, adjustment of the mA and/or kV according to patient size and/or use of iterative reconstruction technique. CONTRAST:  75mL OMNIPAQUE IOHEXOL 350 MG/ML SOLN COMPARISON:  CT abdomen and pelvis 06/29/2022 FINDINGS: Lower chest: Mild curvilinear subsegmental atelectasis and/or scarring within the bilateral lung bases is similar to prior. Heart size is at the upper limits of normal. Coronary artery calcifications are noted. Hepatobiliary: Smooth liver contours. There are again punctate subcentimeter low-density lesions within the right hepatic lobe (axial series 3, image 25) and the left hepatic lobe (axial image 23) that are again too  small to further characterize but not significantly changed from recent prior. The gallbladder is unremarkable. No intrahepatic or extrahepatic biliary ductal dilatation. Pancreas: Unremarkable. No pancreatic ductal dilatation or surrounding inflammatory changes. Spleen: The spleen measures 11 cm in length, within normal limits. There is a 2.9 x 2.6 x 2.6 cm low-density lesion within the medial aspect of the spleen that is unchanged from 07/07/2022 recent CT but new from 12/10/2021. Adrenals/Urinary Tract: Normal bilateral adrenals. The kidneys enhance uniformly and are symmetric in size. There are again bilateral fluid attenuation renal cysts, measuring up to 3.8 cm on the right and 2.8 cm on the left. No follow-up imaging is recommended. The urinary bladder is decompressed, limiting evaluation. Stomach/Bowel: Moderate amount of stool throughout the colon. Normal appearance of the terminal ileum. Normal appendix (axial series 3, image 60) no dilated loops of bowel are seen to indicate bowel obstruction. Vascular/Lymphatic: No abdominal aortic aneurysm. The major intra-abdominal aortic branch vessels are patent. No significant change in mesenteric, porta hepatis, retroperitoneal, and pelvic  lymphadenopathy compared to 07/07/2022, again new from 12/10/2021. Reference gastrohepatic ligament 2.0 cm short axis lymph node (axial series 2, image 21), lymph node at the anterior aspect of the splenic hilum measuring up to 2.0 cm in short axis (axial image 25), and conglomeration of aortocaval and para-aortic lymph nodes with a focal conglomeration measuring up to 2.8 cm in short axis (axial image 42), not significantly changed from 07/07/2022. Reproductive: The prostate again appears to be surgically absent. Other: No ventral abdominal hernia is seen. No free air or free fluid is seen within the abdomen or pelvis. Musculoskeletal: Chronic inferior L4 endplate mild concavity degenerative Schmorl's node is similar to prior. Anterior bridging osteophytes are again seen throughout the visualized inferior thoracic spine. There is complete anterior osseous fusion of the left sacroiliac joint. Moderate anterior right sacroiliac joint degenerative osteophytes. IMPRESSION: Compared to 07/07/2022: 1. No bowel obstruction.  Normal appendix. 2. No significant change in mesenteric, porta hepatis, retroperitoneal, and pelvic lymphadenopathy compared to 07/07/2022, again new from 12/10/2021. 3. No significant change in 2.9 cm low-density lesion within the medial aspect of the spleen compared to 07/07/2022 but new from 12/10/2021. 4. Constellation of findings are again concerning for lymphoma versus metastatic disease. Electronically Signed   By: Neita Garnet M.D.   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  # 7 Anemia and mild thrombocytopenia -- likely multifactorial -- lymphoma + MDS+ >?sepsis r/o GI bleeding.  #8 Abdominal distention with partial obstruction versus ileus.  # 9 Hep B c AB +ve, Hep B SAg neg HepB SAb neg, HBV DNA PCR --neg.  #10 acute kidney injury with sodium level less than 20 possibly related to prerenal causes versus contrast nephropathy versus high risk of tumor  lysis syndrome. Renal ultrasound with no obstruction Uric acid levels up to 11.2 6/12.  Patient was given 1 dose of rasburicase 6 mg and is on allopurinol. Uric Acid 6/13 still at 9.2 -- given 1 additional dose of Rasburicase.  PLAN -Labs done today reviewed . -renal function continues to worsen and phosphorus significantly elevated. Started on CRRT today. -1 additional dose Rasburicase today -intubated today for concerns of aspiration pneumonia -started on post CHOP granix daily for 5-7 days -Was to receive Rituxan  but this was held in the context of his acute renal failure and significant tumor lysis syndrome to allow that to stabilize. -Continue allopurinol for tumor lysis syndrome prophylaxis -Continue transfusion support to maintain hemoglobin more than 8 and platelet count more than 20k. Ordered 1 unit of PRBC today -Monitor and manage electrolytes per hospital medicine. 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 -If GI function does not improve we will need to consider possible TPN  .The total time spent in the appointment was 35 minutes* .  All of the patient's questions were answered with apparent satisfaction. The patient knows to call the clinic with any problems, questions or  concerns.   Wyvonnia Lora MD MS AAHIVMS [facility] Hematology/Oncology Physician [facility]  .*Total Encounter Time as defined by the Centers for Medicare and Medicaid Services includes, in addition to the face-to-face time of a patient visit (documented in the note above) non-face-to-face time: obtaining and reviewing outside history, ordering and reviewing medications, tests or procedures, care coordination (communications with other health care professionals or caregivers) and documentation in the medical record.

## 2022-08-08 NOTE — Procedures (Signed)
Intubation Procedure Note  Zakiah Beckerman  [id number]  09/27/1950  Date:08/08/22  Time:2:37 PM   Provider Performing: Tim Lair   Procedure: Intubation (31500)  Indication(s): Respiratory Failure  Consent: Risks of the procedure as well as the alternatives and risks of each were explained to the patient and/or caregiver.  Consent for the procedure was obtained from patient's daughter, Karena Addison, via phone.  Anesthesia: Etomidate, Versed, and Fentanyl  Time Out: Verified patient identification, verified procedure, site/side was marked, verified correct patient position, special equipment/implants available, medications/allergies/relevant history reviewed, required imaging and test results available.  Sterile Technique: Usual hand hygiene, masks, and gloves were used.  Procedure Description: Patient positioned in bed supine.  Sedation given as noted above.  Patient was intubated with endotracheal tube using Glidescope.  View was Grade 1 full glottis .  Number of attempts was 1.  Colorimetric CO2 detector was consistent with tracheal placement.  Complications/Tolerance: None; patient tolerated the procedure well. Chest X-ray is ordered to verify placement.  EBL: Minimal  Specimen(s): None  Tim Lair, [place] [facility] 08/08/22 2:38 PM  Please see [email or web address] for pager details.  From 7A-7P if no response, please call [phone] After hours, please call ELink [phone]

## 2022-08-08 NOTE — Procedures (Signed)
Central Venous Catheter Insertion Procedure Note  Tyler [name]  [id number]  May 07, 1950  Date:08/08/22  Time:2:39 PM   Provider Performing: Tim Lair   Procedure: Insertion of Non-tunneled Central Venous Catheter(36556)with US guidance (40981)    Indication(s): Hemodialysis  Consent: Risks of the procedure as well as the alternatives and risks of each were explained to the patient and/or caregiver.  Consent for the procedure was obtained and is signed in the bedside chart (verbal consent obtained via telephone from patient's daughter, Karena Addison).  Anesthesia: Topical only with 1% lidocaine   Timeout: Verified patient identification, verified procedure, site/side was marked, verified correct patient position, special equipment/implants available, medications/allergies/relevant history reviewed, required imaging and test results available.  Sterile Technique: Maximal sterile technique including full sterile barrier drape, hand hygiene, sterile gown, sterile gloves, mask, hair covering, sterile ultrasound probe cover (if used).  Procedure Description: Area of catheter insertion was cleaned with chlorhexidine and draped in sterile fashion.   With real-time ultrasound guidance a HD catheter was placed into the right internal jugular vein.  Nonpulsatile blood flow and easy flushing noted in all ports.  The catheter was sutured in place and sterile dressing applied.   R IJ was compressed by bulky cervical lymphadenopathy (topmost vessel with wire in place). Large lymph nodes were present surrounding the IJ superiorly and inferiorly.  Complications/Tolerance: None; patient tolerated the procedure well. Chest X-ray is ordered to verify placement for internal jugular or subclavian cannulation.  Chest x-ray is not ordered for femoral cannulation.  EBL: Minimal  Specimen(s): None  Tim Lair, [place] [facility] 08/08/22 2:40 PM  Please see [email or web address] for  pager details.  From 7A-7P if no response, please call [phone] After hours, please call ELink [phone]

## 2022-08-08 NOTE — Progress Notes (Signed)
[name]  HEMATOLOGY/ONCOLOGY INPATIENT PROGRESS NOTE  Date of Service: 08/07/2022  Inpatient Attending: .Tyler Shipper, MD  SUBJECTIVE  Patient was seen in oncology follow-up today.  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  Nephrology is following and has him on a bicarb drip.  He appears to be a little sleepy and somewhat less verbal.  His fentanyl patch was held by his hospitalist. No nausea or vomiting today.  Still having a fairly distended appearing abdomen.  Potassium levels improved down to 5.1 after Kayexalate.   OBJECTIVE:  NAD  PHYSICAL EXAMINATION: . Vitals:   08/07/22 2100 08/07/22 2220 08/07/22 2300 08/08/22 0000  BP: 101/60 115/64 (!) 117/59 98/66  Pulse: 77 80 78 73  Resp: (!) 9 (!) 9 (!) 9 10  Temp:   (!) 97.5 F (36.4 C)   TempSrc:   Oral   SpO2: 93% 93% 96% 96%  Weight:      Height:       Filed Weights   08/05/22 1028 08/06/22 1726 08/07/22 0500  Weight: 197 lb 12 oz (89.7 kg) 198 lb 13.7 oz (90.2 kg) 200 lb 6.4 oz (90.9 kg)   .Body mass index is 31.39 kg/m.  GENERAL:alert, OROPHARYNX:MMM NG tube in situ LUNGS: clear to auscultation scattered rhonchi  HEART: irreg ABDOMEN: Bowel sounds hypoactive no guarding rigidity or rebound still somewhat distended . NEURO: no focal motor/sensory deficits  MEDICAL HISTORY:  Past Medical History:  Diagnosis Date   Cancer [facility])    prostate   Hypertension     SURGICAL HISTORY: Past Surgical History:  Procedure Laterality Date   LYMPHADENECTOMY Bilateral 09/01/2019   Procedure: LYMPHADENECTOMY;  Surgeon: Tyler Ache, MD;  Location: [facility];  Service: Urology;  Laterality: Bilateral;   ROBOT ASSISTED LAPAROSCOPIC RADICAL PROSTATECTOMY N/A 09/01/2019   Procedure: XI ROBOTIC ASSISTED LAPAROSCOPIC RADICAL PROSTATECTOMY;  Surgeon: Tyler Ache, MD;  Location: [facility];  Service: Urology;  Laterality: N/A;  3 HRS    SOCIAL HISTORY: Social  History   Socioeconomic History   Marital status: Married    Spouse name: Not on file   Number of children: Not on file   Years of education: Not on file   Highest education level: Not on file  Occupational History   Not on file  Tobacco Use   Smoking status: Never   Smokeless tobacco: Never  Vaping Use   Vaping Use: Never used  Substance and Sexual Activity   Alcohol use: Yes    Comment: at least one 40 oz beer daily   Drug use: Never   Sexual activity: Not on file  Other Topics Concern   Not on file  Social History Narrative   Not on file   Social Determinants of Health   Financial Resource Strain: Not on file  Food Insecurity: No Food Insecurity (07/30/2022)   Hunger Vital Sign    Worried About Running Out of Food in the Last Year: Never true    Ran Out of Food in the Last Year: Never true  Transportation Needs: No Transportation Needs (07/30/2022)   PRAPARE - Administrator, Civil Service (Medical): No    Lack of Transportation (Non-Medical): No  Physical Activity: Not on file  Stress: Not on file  Social Connections: Not on file  Intimate Partner Violence: Not At Risk (07/30/2022)   Humiliation, Afraid, Rape, and Kick questionnaire    Fear of Current or Ex-Partner:  No    Emotionally Abused: No    Physically Abused: No    Sexually Abused: No    FAMILY HISTORY: Family History  Problem Relation Age of Onset   Stroke Father     ALLERGIES:  is allergic to amitriptyline, amlodipine, and neurontin [gabapentin].  MEDICATIONS:  Scheduled Meds:  sodium chloride   Intravenous Once   acetaminophen  650 mg Per Tube Once   allopurinol  100 mg Per Tube BID   amiodarone  400 mg Per NG tube BID   Followed by   Melene Muller ON 08/10/2022] amiodarone  200 mg Per Tube Daily   Chlorhexidine Gluconate Cloth  6 each Topical Daily   diphenhydrAMINE  50 mg Intravenous Once   feeding supplement (KATE FARMS STANDARD 1.4)  325 mL Oral BID BM   methylPREDNISolone (SOLU-MEDROL)  injection  80 mg Intravenous Daily   metoprolol tartrate  25 mg Per NG tube BID   multivitamin with minerals  1 tablet Per Tube Daily   polyethylene glycol  17 g Oral Daily   senna  2 tablet Per NG tube Daily   sodium chloride flush  10-40 mL Intracatheter Q12H   Continuous Infusions:  sodium bicarbonate 150 mEq in sterile water 1,150 mL infusion 50 mL/hr at 08/08/22 0000   PRN Meds:.chlorproMAZINE, docusate sodium, HYDROmorphone (DILAUDID) injection, ipratropium-albuterol, lip balm, metoprolol tartrate, ondansetron **OR** ondansetron (ZOFRAN) IV, mouth rinse, phenol, sodium chloride, sodium chloride flush  REVIEW OF SYSTEMS:    10 Point review of Systems was done is negative except as noted above.   LABORATORY DATA:  I have reviewed the data as listed .CBC    Component Value Date/Time   WBC 7.7 08/07/2022 0601   RBC 2.73 (L) 08/07/2022 0601   RBC 2.73 (L) 08/07/2022 0601   HGB 7.5 (L) 08/07/2022 0601   HGB 8.1 (L) 07/19/2022 1432   HCT 24.3 (L) 08/07/2022 0601   PLT 60 (L) 08/07/2022 0601   PLT 226 07/19/2022 1432   MCV 89.0 08/07/2022 0601   MCH 27.5 08/07/2022 0601   MCHC 30.9 08/07/2022 0601   RDW 18.9 (H) 08/07/2022 0601   LYMPHSABS 0.9 08/07/2022 0601   MONOABS 0.5 08/07/2022 0601   EOSABS 0.0 08/07/2022 0601   BASOSABS 0.0 08/07/2022 0601   .    Latest Ref Rng & Units 08/07/2022    6:01 AM 08/06/2022   11:07 AM 08/06/2022    4:21 AM  CBC  WBC 4.0 - 10.5 K/uL 7.7   7.7   Hemoglobin 13.0 - 17.0 g/dL 7.5  7.7  6.3   Hematocrit 39.0 - 52.0 % 24.3  24.3  20.4   Platelets 150 - 400 K/uL 60   83    .    Latest Ref Rng & Units 08/07/2022    6:01 AM 08/06/2022    4:21 AM 08/05/2022    6:17 AM  CMP  Glucose 70 - 99 mg/dL 161  88  096   BUN 8 - 23 mg/dL 73  60  43   Creatinine 0.61 - 1.24 mg/dL 0.45  4.09  8.11   Sodium 135 - 145 mmol/L 128  128  126   Potassium 3.5 - 5.1 mmol/L 5.1  5.7  5.5   Chloride 98 - 111 mmol/L 95  98  98   CO2 22 - 32 mmol/L 18  19  18     Calcium 8.9 - 10.3 mg/dL 8.7  9.0  8.8   Total Protein 6.5 - 8.1 g/dL  4.9  4.7  5.0   Total Bilirubin 0.3 - 1.2 mg/dL 2.8  2.3  1.9   Alkaline Phos 38 - 126 U/L 88  96  94   AST 15 - 41 U/L 67  65  76   ALT 0 - 44 U/L 39  37  38      RADIOGRAPHIC STUDIES: I have personally reviewed the radiological images as listed and agreed with the findings in the report. DG Abd 1 View  Result Date: 08/06/2022 CLINICAL DATA:  Abdominal distension. EXAM: ABDOMEN - 1 VIEW COMPARISON:  Radiograph yesterday FINDINGS: Tip and side port of the enteric tube below the diaphragm in the stomach. No small bowel distension or evidence of obstruction. There is mild gaseous distension of transverse and descending colon. Small volume of formed stool in the right colon. No radiopaque calculi. No acute osseous abnormalities are seen. IMPRESSION: 1. Tip and side port of the enteric tube below the diaphragm in the stomach. 2. Mild gaseous distension of transverse and descending colon which may represent colonic ileus. No small bowel obstruction. Electronically Signed   By: Narda Rutherford M.D.   On: 08/06/2022 18:14   US RENAL  Result Date: 08/06/2022 CLINICAL DATA:  Acute kidney injury. EXAM: RENAL / URINARY TRACT ULTRASOUND COMPLETE COMPARISON:  CT 07/29/2022 FINDINGS: Right Kidney: Renal measurements: 10 x 5.3 x 6.6 cm = volume: 183 mL. No hydronephrosis. Normal parenchymal echogenicity. 3.6 cm cyst arises from the lower pole. This needs no further imaging follow-up. No visible stone or solid lesion. Additional cyst on prior CT not well seen. Left Kidney: Renal measurements: 10.8 x 5.6 x 5.3 cm = volume: 167 mL. No hydronephrosis. Normal parenchymal echogenicity. 2.9 cm cyst in the lower pole. Some of the smaller cysts on CT are not seen by ultrasound. No visible stone or solid lesion. Bladder: Not seen, may be empty. Other: None. IMPRESSION: No obstructive uropathy. Electronically Signed   By: Narda Rutherford M.D.   On:  08/06/2022 18:12   DG Abd 1 View  Result Date: 08/05/2022 CLINICAL DATA:  Nasogastric tube placement. EXAM: ABDOMEN - 1 VIEW COMPARISON:  August 05, 2022 FINDINGS: A nasogastric tube is seen with its distal tip overlying the expected region of the gastric antrum. A small amount of radiopaque contrast is seen overlying the expected region of the gastric fundus. The bowel gas pattern is normal. No radio-opaque calculi are seen. IMPRESSION: Nasogastric tube positioning, as described above. Electronically Signed   By: Aram Candela M.D.   On: 08/05/2022 19:05   DG Abd 1 View  Result Date: 08/05/2022 CLINICAL DATA:  [id number] Vomiting [id number] EXAM: ABDOMEN - 1 VIEW COMPARISON:  08/01/2022 FINDINGS: The bowel gas pattern is normal. residual oral contrastIn the gastric fundus. No radio-opaque calculi or other significant radiographic abnormality are seen. IMPRESSION: Negative. Electronically Signed   By: Corlis Leak M.D.   On: 08/05/2022 17:02   DG Chest Port 1V same Day  Result Date: 08/05/2022 CLINICAL DATA:  Dyspnea EXAM: PORTABLE CHEST 1 VIEW COMPARISON:  08/03/2022 FINDINGS: Cardiac shadow is within normal limits. Persistent widening of the superior mediastinum is noted related to lymphadenopathy. Lungs are clear bilaterally. No bony abnormality is noted. IMPRESSION: Persistent lymphadenopathy with mediastinal widening. No other focal abnormality is seen. Electronically Signed   By: Alcide Clever M.D.   On: 08/05/2022 11:11   [place] EKG SITE RITE  Result Date: 08/05/2022 If Site Rite image not attached, placement could not be confirmed due to current  cardiac rhythm.  CT CHEST W CONTRAST  Result Date: 08/03/2022 CLINICAL DATA:  Inpatient. Abnormal chest radiograph. Chest wall mass. * Tracking Code: BO * EXAM: CT CHEST WITH CONTRAST TECHNIQUE: Multidetector CT imaging of the chest was performed during intravenous contrast administration. RADIATION DOSE REDUCTION: This exam was performed according to the  departmental dose-optimization program which includes automated exposure control, adjustment of the mA and/or kV according to patient size and/or use of iterative reconstruction technique. CONTRAST:  75mL OMNIPAQUE IOHEXOL 300 MG/ML  SOLN COMPARISON:  Chest radiograph from one day prior. 07/07/2022 chest CT. FINDINGS: Cardiovascular: Normal heart size. No significant pericardial effusion/thickening. Left anterior descending coronary atherosclerosis. Great vessels are normal in course and caliber. No central pulmonary emboli. Mediastinum/Nodes: No significant thyroid nodules. Unremarkable esophagus. Bulky bilateral axillary adenopathy, increased from 07/07/2022. Representative short axis diameter 3.8 cm right axillary node (series 3/image 57), previously 2.7 cm. Bilateral bulky lower neck adenopathy is increased. Representative left lower neck 3.0 cm node (series 3/image 6), previously 1.8 cm. Bulky bilateral mediastinal adenopathy in the prevascular, AP window, right paratracheal and subcarinal chains, increased. Representative 3.0 cm right paratracheal node (series 3/image 53), previously 1.8 cm. Increased bilateral hilar adenopathy. Representative 2.0 cm left hilar node (series 3/image 77), previously 1.2 cm. Lungs/Pleura: No pneumothorax. Small dependent bilateral pleural effusions with associated compressive mild-to-moderate atelectasis in the posterior lower lobes bilaterally, new. No lung masses or significant pulmonary nodules in the aerated portions of the lungs. Upper abdomen: Multiple splenic masses, for example 3.0 cm medially (series 3/image 131), not previously discretely visualized. Bulky upper abdominal lymphadenopathy in the gastrohepatic ligament, porta hepatis, portacaval, aortocaval and left para-aortic chains, increased. Representative 2.1 cm gastrohepatic ligament node (series 3/image 130), previously 1.8 cm. Representative 2.0 cm left para-aortic node (series 3/image 155), previously 1.3 cm.  Interval growth of multiple upper omental soft tissue nodules, largest 3.5 x 2.2 cm (series 3/image 153), previously 2.0 x 1.2 cm. Musculoskeletal: No aggressive appearing focal osseous lesions. Moderate thoracic spondylosis. IMPRESSION: 1. Rapid interval progression of bulky bilateral axillary, bilateral hilar, bilateral mediastinal, bilateral lower neck and upper abdominal lymphadenopathy since recent 07/07/2022 chest CT. Multiple new splenic masses. Enlarging upper omental soft tissue nodules. 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 The axillary adenopathy should be amenable to percutaneous biopsy. 2. Small dependent bilateral pleural effusions with associated compressive mild-to-moderate posterior lower lobe atelectasis, new. 3. One vessel coronary atherosclerosis. Electronically Signed   By: Delbert Phenix M.D.   On: 08/03/2022 12:02   DG Chest Port 1V same Day  Result Date: 08/02/2022 CLINICAL DATA:  Dyspnea shortness of breath EXAM: PORTABLE CHEST 1 VIEW COMPARISON:  Multiple exams, including 07/31/2022 FINDINGS: Substantially widened mediastinum at 14.9 cm presumably due to adenopathy given findings on prior imaging workups. This is stable compared to 07/31/2022 and substantially increased from 05/23/2022, and questionably increased from 07/06/2022 (AP projection on today's exam may be magnify the mediastinum more than prior PA projections and CT imaging). Heart size within normal limits. Hazy indistinct density at the left lung base possibly from atelectasis or pneumonia, minimally improved from previous. IMPRESSION: 1. Substantially widened mediastinum at 14.9 cm, presumably due to adenopathy given previous chest CT findings. 2. Hazy indistinct density at the left lung base possibly from atelectasis or pneumonia, minimally improved from previous. Electronically Signed   By: Gaylyn Rong M.D.    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 intake, tachycardia and episode of shaking and unresponsiveness then confused thereafter. EEG to evaluate for seizure Level of alertness: Awake AEDs during EEG study: None Technical aspects: This EEG study was done with scalp electrodes positioned according to the 10-20 International system of electrode placement. Electrical activity was reviewed with band pass filter of 1-70Hz , sensitivity of 7 uV/mm, display speed of 51mm/sec with a 60Hz  notched filter applied as appropriate. EEG data were recorded continuously and digitally stored.  Video monitoring was available and reviewed as appropriate. Description: The posterior dominant rhythm consists of 7.5 Hz activity of moderate voltage (25-35 uV) seen predominantly in posterior head regions, symmetric and reactive to eye opening and eye closing. EEG showed intermittent generalized sharply contoured high amplitude 2-3hz  delta slowing. Hyperventilation and photic stimulation were not performed.   ABNORMALITY - Intermittent slow, generalized IMPRESSION: This study is suggestive of mild diffuse encephalopathy, nonspecific etiology. No seizures or epileptiform discharges were seen throughout the recording.  If suspicion for interictal activity remains a concern, a prolonged study can be considered. Charlsie Quest   DG Abd 1 View  Result Date: 08/01/2022 CLINICAL DATA:  Abdominal distension. EXAM: ABDOMEN - 1 VIEW COMPARISON:  CT AP 07/29/2022 FINDINGS: No dilated loops of large or small bowel identified. Stool noted within the colon. No abnormal abdominal or pelvic  calcifications. Visualized osseous structures appear intact. IMPRESSION: Nonobstructive bowel gas pattern. Electronically Signed   By: Signa Kell M.D.   On: 08/01/2022 12:41   DG Chest Port 1 View  Result Date: 07/31/2022 CLINICAL DATA:  Possible aspiration EXAM: PORTABLE CHEST 1 VIEW COMPARISON:  07/29/2022, 07/07/2022 FINDINGS: Cardiac shadow is stable. Widened mediastinum is again identified accentuated by the portable technique. This is consistent with the known mediastinal adenopathy seen on prior CT. Lungs are well aerated bilaterally. Mild bibasilar atelectasis is seen. No sizable effusion is noted. No bony abnormality is seen. IMPRESSION: Widened mediastinum consistent with the known mediastinal adenopathy. Mild bibasilar atelectasis. Electronically Signed   By: Alcide Clever M.D.   On: 07/31/2022 23:06   CT MAXILLOFACIAL WO CONTRAST  Result Date: 07/30/2022 CLINICAL DATA:  Right facial pain EXAM: CT MAXILLOFACIAL WITHOUT CONTRAST TECHNIQUE: Multidetector CT imaging of the maxillofacial structures was performed. Multiplanar CT image reconstructions were also generated. RADIATION DOSE REDUCTION: This exam was performed according to the departmental dose-optimization program which includes automated exposure control, adjustment of the mA and/or kV according to patient size and/or use of iterative reconstruction technique. COMPARISON:  None Available. FINDINGS: Osseous: No fracture or mandibular dislocation. No destructive process. Large periapical lucency at the root of the most posterior remaining right maxillary 2. Orbits: Negative. No traumatic or inflammatory finding. Sinuses: Right anterior ethmoid and maxillary sinus complete opacification with polypoid contours. Soft tissues: Multiple enlarged submandibular lymph nodes, measuring up to 1.6 cm. No abscess or fluid collection. Limited intracranial: No significant or unexpected finding. IMPRESSION: 1. Large periapical lucency at the root of the  most posterior remaining right maxillary tooth. No associated abscess. 2. Right anterior ethmoid and maxillary sinus complete opacification with polypoid contours, possibly antrochoanal polyp. 3. Multiple enlarged submandibular lymph nodes, measuring up to 1.6 cm, likely reactive. Electronically Signed  By: Deatra Robinson M.D.   On: 07/30/2022 20:38   MR BRAIN W WO CONTRAST  Result Date: 07/30/2022 CLINICAL DATA:  Seizure and headache EXAM: MRI HEAD WITHOUT AND WITH CONTRAST TECHNIQUE: Multiplanar, multiecho pulse sequences of the brain and surrounding structures were obtained without and with intravenous contrast. CONTRAST:  9mL GADAVIST GADOBUTROL 1 MMOL/ML IV SOLN COMPARISON:  None Available. FINDINGS: Brain: No acute infarct, mass effect or extra-axial collection. No acute or chronic hemorrhage. There is multifocal hyperintense T2-weighted signal within the white matter. Parenchymal volume and CSF spaces are normal. The midline structures are normal. There is no abnormal contrast enhancement. Vascular: Major flow voids are preserved. Skull and upper cervical spine: Normal calvarium and skull base. Visualized upper cervical spine and soft tissues are normal. Sinuses/Orbits:Opacification of the right maxillary and right anterior ethmoid sinuses. Normal orbits. IMPRESSION: 1. No acute intracranial abnormality. 2. Multifocal hyperintense T2-weighted signal within the white matter, most consistent with chronic small vessel ischemia. 3. Polypoid lesion of the right maxillary and ethmoid sinuses, possibly antrochoanal polyp. Nonemergent ENT consultation might be helpful. Electronically Signed   By: Deatra Robinson M.D.   On: 07/30/2022 20:34   CT ABDOMEN PELVIS WO CONTRAST  Result Date: 07/29/2022 CLINICAL DATA:  Acute abdominal pain. EXAM: CT ABDOMEN AND PELVIS WITHOUT CONTRAST TECHNIQUE: Multidetector CT imaging of the abdomen and pelvis was performed following the standard protocol without IV contrast. RADIATION  DOSE REDUCTION: This exam was performed according to the departmental dose-optimization program which includes automated exposure control, adjustment of the mA and/or kV according to patient size and/or use of iterative reconstruction technique. COMPARISON:  CT examination dated Jul 15, 2022 FINDINGS: Lower chest: Bibasilar subsegmental atelectasis. Hepatobiliary: No focal liver abnormality is seen. No gallstones, gallbladder wall thickening, or biliary dilatation. Pancreas: Mild generalized pancreatic atrophy. No pancreatic ductal dilatation or surrounding inflammatory changes. Spleen: Normal in size without focal abnormality. Adrenals/Urinary Tract: Adrenal glands are unremarkable. Kidneys are normal, without renal calculi, focal lesion, or hydronephrosis. Bilateral exophytic renal cysts, likely simple cysts. Bladder is unremarkable. Stomach/Bowel: Stomach is within normal limits. Appendix appears normal. Scattered colonic diverticulosis without evidence of acute diverticulitis. No evidence of bowel wall thickening, distention, or inflammatory changes. Vascular/Lymphatic: No significant vascular findings are present. There numerous lymph nodes of multiple sizes which are para-aortic, mesenteric, porta hepatis as well as multiple lymph nodes about the spleen as well as few lymph nodes in the pelvis. These are not significantly changed since prior examination. Reproductive: Postsurgical changes in the prostate bed. Other: Scattered soft tissue densities in the anterior abdominal wall, which may represent lymph nodes. No abdominopelvic ascites. Musculoskeletal: Compression fracture of the L4 vertebral body is unchanged. No acute osseous abnormality. IMPRESSION: 1. No CT evidence of acute abdominal/pelvic process. 2. Scattered colonic diverticulosis without evidence of acute diverticulitis. 3. Numerous lymph nodes of multiple sizes are present throughout the abdomen and pelvis predominantly in the mesentery and  para-aortic region, which are not significantly changed since prior examination. 4. Chronic compression fracture of the L4 vertebral body. Electronically Signed   By: Larose Hires D.O.   On: 07/29/2022 19:26   CT Head Wo Contrast  Result Date: 07/29/2022 CLINICAL DATA:  Seizures, new onset. EXAM: CT HEAD WITHOUT CONTRAST TECHNIQUE: Contiguous axial images were obtained from the base of the skull through the vertex without intravenous contrast. RADIATION DOSE REDUCTION: This exam was performed according to the departmental dose-optimization program which includes automated exposure control, adjustment of the mA and/or kV according to  patient size and/or use of iterative reconstruction technique. COMPARISON:  None Available. FINDINGS: Brain: No evidence of acute infarction, hemorrhage, hydrocephalus, extra-axial collection or mass lesion/mass effect. Vascular: No hyperdense vessel or unexpected calcification. Skull: Normal. Negative for fracture or focal lesion. Sinuses/Orbits: Mucosal thickening of the ethmoid air cells. Other: None. IMPRESSION: 1. No acute intracranial abnormality. 2. Mucosal thickening of the ethmoid air cells. Electronically Signed   By: Larose Hires D.O.   On: 07/29/2022 19:15   ECHOCARDIOGRAM COMPLETE  Result Date: 07/29/2022    ECHOCARDIOGRAM REPORT   Patient Name:   Tyler [name] Date of Exam: 07/29/2022 Medical Rec #:  [id number]    Height:       67.0 in Accession #:    [id number]   Weight:       185.9 lb Date of Birth:  [birth year]    BSA:          1.960 m Patient Age:    72 years     BP:           111/73 mmHg Patient Gender: M            HR:           117 bpm. Exam Location:  Outpatient Procedure: 2D Echo, Color Doppler and Cardiac Doppler Indications:    Chemo  History:        Patient has prior history of Echocardiogram examinations, most                 recent 12/16/2021. Risk Factors:Hypertension. Prostate Cancer,                 Lymphoma, Chemotherapy.  Sonographer:    Milbert Coulter  Referring Phys: Johney Maine  Sonographer Comments: Image acquisition challenging due to respiratory motion and Tachycardia. IMPRESSIONS  1. Intracavitary gradient is realted to dynamic function. Left ventricular ejection fraction, by estimation, is >75%. The left ventricle has hyperdynamic function. The left ventricle has no regional wall motion abnormalities. There is mild concentric left ventricular hypertrophy. Left ventricular diastolic parameters were normal.  2. Right ventricular systolic function is hyperdynamic. The right ventricular size is normal. Tricuspid regurgitation signal is inadequate for assessing PA pressure.  3. The mitral valve is normal in structure. No evidence of mitral valve regurgitation.  4. The aortic valve is tricuspid. There is mild calcification of the aortic valve. Aortic valve regurgitation is not visualized. No aortic stenosis is present. Comparison(s): No significant change from prior study. FINDINGS  Left Ventricle: Intracavitary gradient is realted to dynamic function. Left ventricular ejection fraction, by estimation, is >75%. The left ventricle has hyperdynamic function. The left ventricle has no regional wall motion abnormalities. The left ventricular internal cavity size was small. There is mild concentric left ventricular hypertrophy. Left ventricular diastolic parameters were normal. Right Ventricle: The right ventricular size is normal. No increase in right ventricular wall thickness. Right ventricular systolic function is hyperdynamic. Tricuspid regurgitation signal is inadequate for assessing PA pressure. Left Atrium: Left atrial size was normal in size. Right Atrium: Right atrial size was normal in size. Pericardium: Trivial pericardial effusion is present. The pericardial effusion is posterior to the left ventricle. Mitral Valve: The mitral valve is normal in structure. No evidence of mitral valve regurgitation. Tricuspid Valve: The tricuspid valve is normal in  structure. Tricuspid valve regurgitation is not demonstrated. Aortic Valve: The aortic valve is tricuspid. There is mild calcification of the aortic valve. Aortic valve regurgitation is not visualized. No aortic stenosis is  present. Aortic valve mean gradient measures 10.0 mmHg. Aortic valve peak gradient measures 18.5 mmHg. Aortic valve area, by VTI measures 2.53 cm. Pulmonic Valve: The pulmonic valve was not well visualized. Pulmonic valve regurgitation is not visualized. Aorta: The aortic root and ascending aorta are structurally normal, with no evidence of dilitation. IAS/Shunts: The interatrial septum was not well visualized.  LEFT VENTRICLE PLAX 2D LVIDd:         4.00 cm     Diastology LVIDs:         1.80 cm     LV e' medial:    8.24 cm/s LV PW:         1.10 cm     LV E/e' medial:  7.5 LV IVS:        1.20 cm     LV e' lateral:   11.70 cm/s LVOT diam:     2.10 cm     LV E/e' lateral: 5.3 LV SV:         72 LV SV Index:   37 LVOT Area:     3.46 cm  LV Volumes (MOD) LV vol d, MOD A2C: 27.2 ml LV vol d, MOD A4C: 49.7 ml LV vol s, MOD A2C: 7.1 ml LV vol s, MOD A4C: 13.6 ml LV SV MOD A2C:     20.1 ml LV SV MOD A4C:     49.7 ml LV SV MOD BP:      26.5 ml RIGHT VENTRICLE TAPSE (M-mode): 2.0 cm LEFT ATRIUM             Index LA diam:        3.50 cm 1.79 cm/m LA Vol (A2C):   26.6 ml 13.57 ml/m LA Vol (A4C):   36.9 ml 18.82 ml/m LA Biplane Vol: 31.3 ml 15.97 ml/m  AORTIC VALVE AV Area (Vmax):    2.77 cm AV Area (Vmean):   2.66 cm AV Area (VTI):     2.53 cm AV Vmax:           215.00 cm/s AV Vmean:          146.000 cm/s AV VTI:            0.285 m AV Peak Grad:      18.5 mmHg AV Mean Grad:      10.0 mmHg LVOT Vmax:         172.00 cm/s LVOT Vmean:        112.000 cm/s LVOT VTI:          0.208 m LVOT/AV VTI ratio: 0.73  AORTA Ao Asc diam: 2.90 cm MITRAL VALVE MV Area (PHT): 4.49 cm    SHUNTS MV Decel Time: 169 msec    Systemic VTI:  0.21 m MV E velocity: 61.90 cm/s  Systemic Diam: 2.10 cm MV A velocity: 89.20 cm/s MV  E/A ratio:  0.69 Riley Lam MD Electronically signed by Riley Lam MD Signature Date/Time: 07/29/2022/4:01:01 PM    Final    DG Chest Port 1 View  Result Date: 07/29/2022 CLINICAL DATA:  Cough.  Syncope. EXAM: PORTABLE CHEST 1 VIEW COMPARISON:  May 23, 2022.  Jul 07, 2022. FINDINGS: Prominence of the superior mediastinum is noted consistent with adenopathy as noted on prior CT scan. Stable cardiac size. Lungs are clear. Bony thorax is unremarkable. IMPRESSION: Widening of superior mediastinum consistent with adenopathy noted on prior CT scan of Jul 07, 2022. This is concerning for lymphoma or metastatic disease. Electronically Signed   By: Fayrene Fearing  Christen Butter M.D.   On: 07/29/2022 15:34   [place] CORE BIOPSY (LYMPH NODES)  Result Date: 07/16/2022 INDICATION: 72 year old male referred for biopsy of axillary lymph node EXAM: ULTRASOUND-GUIDED BIOPSY AXILLARY LYMPH NODE MEDICATIONS: None. ANESTHESIA/SEDATION: None COMPLICATIONS: None PROCEDURE: Informed written consent was obtained from the patient with interpreter after a thorough discussion of the procedural risks, benefits and alternatives. All questions were addressed. Maximal Sterile Barrier Technique was utilized including caps, mask, sterile gowns, sterile gloves, sterile drape, hand hygiene and skin antiseptic. A timeout was performed prior to the initiation of the procedure. Ultrasound survey was performed with images stored and sent to PACs. The left axillary region was prepped with chlorhexidine in a sterile fashion, and a sterile drape was applied covering the operative field. A sterile gown and sterile gloves were used for the procedure. Local anesthesia was provided with 1% Lidocaine. Ultrasound guidance was used to infiltrate the region with 1% lidocaine for local anesthesia. Four separate 16 gauge core needle biopsy were then acquired of the left axillary lymph node using ultrasound guidance. Images were stored. Final image was stored  after biopsy. Patient tolerated the procedure well and remained hemodynamically stable throughout. No complications were encountered and no significant blood loss was encounter IMPRESSION: Status post ultrasound-guided biopsy of left axillary lymph node Signed, Yvone Neu. Miachel Roux, RPVI [facility] Electronically Signed   By: Gilmer Mor D.O.   On: 07/16/2022 17:07   CT ABDOMEN PELVIS W CONTRAST  Result Date: 07/15/2022 CLINICAL DATA:  Possible lymphoma. Bowel obstruction suspected. Right lower quadrant and right upper quadrant abdominal pain for 6 weeks. Nausea and vomiting. Scheduled for a lymph node biopsy tomorrow. EXAM: CT ABDOMEN AND PELVIS WITH CONTRAST TECHNIQUE: Multidetector CT imaging of the abdomen and pelvis was performed using the standard protocol following bolus administration of intravenous contrast. RADIATION DOSE REDUCTION: This exam was performed according to the departmental dose-optimization program which includes automated exposure control, adjustment of the mA and/or kV according to patient size and/or use of iterative reconstruction technique. CONTRAST:  75mL OMNIPAQUE IOHEXOL 350 MG/ML SOLN COMPARISON:  CT abdomen and pelvis 06/29/2022 FINDINGS: Lower chest: Mild curvilinear subsegmental atelectasis and/or scarring within the bilateral lung bases is similar to prior. Heart size is at the upper limits of normal. Coronary artery calcifications are noted. Hepatobiliary: Smooth liver contours. There are again punctate subcentimeter low-density lesions within the right hepatic lobe (axial series 3, image 25) and the left hepatic lobe (axial image 23) that are again too small to further characterize but not significantly changed from recent prior. The gallbladder is unremarkable. No intrahepatic or extrahepatic biliary ductal dilatation. Pancreas: Unremarkable. No pancreatic ductal dilatation or surrounding inflammatory  changes. Spleen: The spleen measures 11 cm in length, within normal limits. There is a 2.9 x 2.6 x 2.6 cm low-density lesion within the medial aspect of the spleen that is unchanged from 07/07/2022 recent CT but new from 12/10/2021. Adrenals/Urinary Tract: Normal bilateral adrenals. The kidneys enhance uniformly and are symmetric in size. There are again bilateral fluid attenuation renal cysts, measuring up to 3.8 cm on the right and 2.8 cm on the left. No follow-up imaging is recommended. The urinary bladder is decompressed, limiting evaluation. Stomach/Bowel: Moderate amount of stool throughout the colon. Normal appearance of the terminal ileum. Normal appendix (axial series 3, image 60) no dilated loops of bowel are seen to indicate bowel obstruction. Vascular/Lymphatic: No abdominal aortic aneurysm. The major intra-abdominal aortic branch vessels are patent.  No significant change in mesenteric, porta hepatis, retroperitoneal, and pelvic lymphadenopathy compared to 07/07/2022, again new from 12/10/2021. Reference gastrohepatic ligament 2.0 cm short axis lymph node (axial series 2, image 21), lymph node at the anterior aspect of the splenic hilum measuring up to 2.0 cm in short axis (axial image 25), and conglomeration of aortocaval and para-aortic lymph nodes with a focal conglomeration measuring up to 2.8 cm in short axis (axial image 42), not significantly changed from 07/07/2022. Reproductive: The prostate again appears to be surgically absent. Other: No ventral abdominal hernia is seen. No free air or free fluid is seen within the abdomen or pelvis. Musculoskeletal: Chronic inferior L4 endplate mild concavity degenerative Schmorl's node is similar to prior. Anterior bridging osteophytes are again seen throughout the visualized inferior thoracic spine. There is complete anterior osseous fusion of the left sacroiliac joint. Moderate anterior right sacroiliac joint degenerative osteophytes. IMPRESSION: Compared  to 07/07/2022: 1. No bowel obstruction.  Normal appendix. 2. No significant change in mesenteric, porta hepatis, retroperitoneal, and pelvic lymphadenopathy compared to 07/07/2022, again new from 12/10/2021. 3. No significant change in 2.9 cm low-density lesion within the medial aspect of the spleen compared to 07/07/2022 but new from 12/10/2021. 4. Constellation of findings are again concerning for lymphoma versus metastatic disease. Electronically Signed   By: Neita Garnet M.D.   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  # 7 Anemia and mild thrombocytopenia -- likely multifactorial -- lymphoma + MDS+ >?sepsis r/o GI bleeding.  #8 Abdominal distention with partial obstruction versus ileus.  # 9 Hep B c AB +ve, Hep B SAg neg HepB SAb neg, HBV DNA PCR --neg.  #10 acute kidney injury with sodium level less than 20 possibly related to prerenal causes versus contrast nephropathy versus high risk of tumor lysis syndrome. Renal ultrasound with no obstruction Uric acid levels up to 11.2 today.  Patient was 1 given 1 dose of rasburicase 6 mg and is on allopurinol.   PLAN -Labs done today reviewed . Worsening renal function-.  No evidence of obstruction on ultrasound of the kidneys and Foley's catheter is in  situ. -On bicarb drip per nephrology. -Received rasburicase for his elevated uric acid which could be due to decreased GFR but also from tumor lysis. -Nephrology following for his acute kidney injury. --Received mini CHOP yesterday. -Was to receive Rituxan today but this was held in the context of his acute renal failure and significant tumor lysis to allow that to stabilize. -Will need to start growth factor support tomorrow. -Continue allopurinol for tumor lysis syndrome prophylaxis -Continue transfusion support to maintain hemoglobin more than 8 and platelet count more than 20k -Monitor and manage electrolytes per hospital medicine. -NG in situ with patient not being able to eat anything  over the last 24 hours. -If GI function does not improve we will need to consider possible TPN -Ongoing goals of care discussion -Appreciate excellent hospital medicine and nephrology cares.  .The total time spent in the appointment was 35 minutes* .  All of the patient's questions were answered with apparent satisfaction. The patient knows to call the clinic with any problems, questions or concerns.   Wyvonnia Lora MD MS AAHIVMS [facility] Hematology/Oncology Physician [facility]  .*Total Encounter Time as defined by the Centers for Medicare and Medicaid Services includes, in addition to the face-to-face time of a patient visit (documented in the note above) non-face-to-face time: obtaining and reviewing outside history, ordering and reviewing medications, tests or procedures, care coordination (communications with other health care professionals or caregivers) and documentation in the medical record.

## 2022-08-08 NOTE — Progress Notes (Signed)
OT Cancellation Note  Patient Details Name: Enzo Treu MRN: [id number] DOB: 08-21-1950   Cancelled Treatment:    Reason Eval/Treat Not Completed: Medical issues which prohibited therapy. Per nurse, pt was intubated and started on CRRT today. Hold therapy and follow up at later date, as appropriate.    Reuben Likes, OTR/L 08/08/2022, 4:42 PM

## 2022-08-08 NOTE — Progress Notes (Signed)
Brief Nutrition Note  Received RD consult for tube feed recommendations, plan for only trickle TF today.  Vital HP at 30mL/hr to start this evening. NGT xray verified in stomach.  Patient being followed by RD, last note yesterday 6/12. Patient noted to be intubated this afternoon and plan to start CRRT today.  New assessment of estimated needs and tube feed recommendations as below:  Estimated Nutritional Needs:  Kcal:  2200-2600 kcals Protein:  150-175 grams Fluid:  >/= 2.2L or per MD  - Once able to advance past trickle TF, recommend below TF regimen: Vital 1.5 at 60 ml/h (1440 ml per day) *Would recommend advancing slowly (10mL Q12H) once medically appropriate Prosource TF20 60 ml TID Provides 2400 kcal, 157 gm protein, 1100 ml free water daily   Shelle Iron RD, LDN For contact information, refer to [facility].

## 2022-08-08 NOTE — Progress Notes (Signed)
Pharmacy Antibiotic Note  Tyler [name] is a 72 y.o. male admitted on 07/29/2022 with  aspiration pneumonia .  Pharmacy has been consulted for Zosyn dosing.  Plan: Patient to be started on CRRT, will start Zosyn 3.375g IV q6  Height: 5\' 7"  (170.2 cm) Weight: 95.3 kg (210 lb 1.6 oz) IBW/kg (Calculated) : 66.1  Temp (24hrs), Avg:97.2 F (36.2 C), Min:95.3 F (35.2 C), Max:97.7 F (36.5 C)  Recent Labs  Lab 08/03/22 0848 08/05/22 0617 08/06/22 0421 08/07/22 0601 08/08/22 0355  WBC 9.0 9.3 7.7 7.7 6.4  CREATININE 0.97 1.36* 2.38* 3.72* 4.89*    Estimated Creatinine Clearance: 15.5 mL/min (A) (by C-G formula based on SCr of 4.89 mg/dL (H)).    Allergies  Allergen Reactions   Amitriptyline Other (See Comments)    Nightmares, Night sweats, Migraines    Amlodipine Swelling    BILATERAL FEET TO ANKLES   Neurontin [Gabapentin] Other (See Comments)    Nightmares; Night sweats; Headache     Thank you for allowing pharmacy to be a part of this patient's care.  Berkley Harvey 08/08/2022 1:20 PM

## 2022-08-08 NOTE — Progress Notes (Signed)
[facility] Progress Note  Subjective: seen in room, pt confused, not responding well. Creat up to 4.7, UOP very poor.   Vitals:   08/08/22 0505 08/08/22 0600 08/08/22 0700 08/08/22 0820  BP: 125/72 112/66 104/68   Pulse: 78 78 76   Resp: 11 (!) 8 20   Temp:    (!) 97.5 F (36.4 C)  TempSrc:    Oral  SpO2: 95% 98% 96%   Weight:      Height:        Exam: Gen alert, chronically ill appearing elderly male No rash, cyanosis or gangrene Sclera anicteric, throat clear  No jvd or bruits Chest clear bilat to bases, no rales/ wheezing RRR no RG Abd soft ntnd no mass or ascites +bs GU foley cath draining minimal urine MS no joint effusions or deformity Ext diffuse 1-2 LE > UE pitting edema Neuro is barely awake, more sommolent, not responding verbally      Home meds include - allopurinol, lisniopril 5 qd, reglan, MVI, elavil, gabapentin, prednisone, prns/ vits/ supps       UA turbid, 100 prot, few bact, 6-10 rbc/ 21-50 wbc, 0-5 epis    UNa < 10, UCr 130      Renal US - 10.4/ 10.8 cm kidneys w/o hydro, normal echo    CXR 6/10 - clear lungs, bilat LAN     Assessment/ Plan: AKI - b/l creatinine 0.6- 0.9 here on admission. Creat rose up to 1.3 on 6/10 and 2.3 today 6/11.  UA shows ^wbc, min rbc's/ protein. Renal US shows no obstruction. CXR from yest shows clear lungs. He did receive IV contrast on 6/08. Also on 6/08- 6/09 in the evening he did have a few low BP's in the 80s-90s. Uric acid also is up in the 9 -11 range so tumor lysis syndrome is a potential cause. ECHO done here showed normal LVEF. Suspect AKI due to TLS +  IV contrast +/- intravasc volume depletion/ low alb/ cap leak. Pt rec'd rasburicase 6/12 per ONC, further dosing per ONC. Renal failure has progressed and AMS is worsening, would recommend initiation of CRRT today. Have d/w pmd.   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 But last CXR was clear on 6/10.  Anemia/ pancytopenia - per pmd/ oncology Parox atrial fib H/o prostate cancer     Vinson Moselle MD [facility] 08/08/2022, 2:12 PM  Recent Labs  Lab 08/02/22 0004 08/03/22 0848 08/07/22 0601 08/08/22 0355  HGB 7.4*   < > 7.5* 7.3*  ALBUMIN  --    < > 1.9* 1.9*  CALCIUM 7.5*   < > 8.7* 8.3*  PHOS 2.8  --   --  11.6*  CREATININE 0.82   < > 3.72* 4.89*  K 4.0   < > 5.1 5.6*   < > = values in this interval not displayed.    No results for input(s): "IRON", "TIBC", "FERRITIN" in the last 168 hours. Inpatient medications:  acetaminophen  650 mg Per Tube Once   allopurinol  100 mg Per Tube BID   amiodarone  400 mg Per NG tube BID   Followed by   Melene Muller ON 08/10/2022] amiodarone  200 mg Per Tube Daily   Chlorhexidine Gluconate Cloth  6 each Topical Q2200   docusate  100 mg Per Tube BID   methylPREDNISolone (SOLU-MEDROL) injection  80  mg Intravenous Daily   multivitamin with minerals  1 tablet Per Tube Daily   mouth rinse  15 mL Mouth Rinse Q2H   pantoprazole (PROTONIX) IV  40 mg Intravenous Daily   polyethylene glycol  17 g Per Tube Daily   senna  2 tablet Per NG tube Daily   sodium chloride flush  10-40 mL Intracatheter Q12H   Tbo-filgastrim (GRANIX) SQ  480 mcg Subcutaneous q1800    dexmedetomidine (PRECEDEX) IV infusion 0.4 mcg/kg/hr (08/08/22 1411)   norepinephrine (LEVOPHED) Adult infusion 7 mcg/min (08/08/22 1347)   piperacillin-tazobactam     prismasol BGK 2/2.5 dialysis solution     prismasol BGK 2/2.5 replacement solution     prismasol BGK 2/2.5 replacement solution     sodium bicarbonate 150 mEq in sterile water 1,150 mL infusion 50 mL/hr at 08/08/22 1300   chlorproMAZINE, docusate sodium, fentaNYL (SUBLIMAZE) injection, heparin sodium (porcine), ipratropium-albuterol, lip balm, midazolam, ondansetron **OR** ondansetron (ZOFRAN) IV, mouth rinse, sodium chloride flush

## 2022-08-08 NOTE — Consult Note (Signed)
NAME:  Tyler [name], MRN:  [id number], DOB:  [birth year], LOS: 10 ADMISSION DATE:  07/29/2022 CONSULTATION DATE:  08/08/2022 REFERRING MD:  Hanley Ben - [facility] CHIEF COMPLAINT: Sepsis, ARF with need for RRT   History of Present Illness:  72 year old man who presented to [facility] 6/3 for abdominal pain, near-syncope. PMHx significant for HTN, PAF (not on AC), history of PE, MDS with follicular B-cell lymphoma, prostate CA, recurrent UTIs. Presented 11/2021 for pancytopenia, found to have MDS with eventual diagnosis of follicular B-cell lymphoma. Followed by Oncology.  History is obtained primarily from chart review. Patient reportedly was having fevers at home with near-syncope when his daughter brought him into the ED. Endorsed poor appetite for "a long time" with poor PO intake. On ED arrival, patient was febrile to Tmax 100.64F tachycardic to 100s and hypotensive with BP 85/73, LA > 5. WBC 13.8, Hgb 6.4, Plt 109. K 4.4, Cr 1.01. Admitted to [facility].  PCCM consulted 6/13 for worsening ARF and need for CRRT.  Pertinent Medical History:   Past Medical History:  Diagnosis Date   Cancer (HCC)    prostate   Follicular lymphoma (HCC) 2024   History of pulmonary embolism    Hypertension    Myelodysplastic syndrome (HCC)    PAF (paroxysmal atrial fibrillation) (HCC)    Recurrent UTI    Significant Hospital Events: Including procedures, antibiotic start and stop dates in addition to other pertinent events   6/3 - Presented to ED for fever, near-syncope. Presumed sepsis. Admitted to [facility]. 6/13 - Worsening acute renal failure as evidenced by rising BUN/Cr, hyperkalemia to 5.6. PCCM consulted for ARF/need for RRT, line placement. Intubated. R IJ Trialysis catheter placed.  Interim History / Subjective:  PCCM consulted for worsening ARF, hyperK Will require CRRT Trialysis catheter to be placed Also concern for aspiration, gurgling with significant secretion burden Will intubate prior to line placement; daughter  Karena Addison consented for both procedures via phone  Objective:  Blood pressure 104/68, pulse 76, temperature (!) 97.5 F (36.4 C), temperature source Oral, resp. rate 20, height 5\' 7"  (1.702 m), weight 95.3 kg, SpO2 96 %.    Vent Mode: PRVC FiO2 (%):  [100 %] 100 % Set Rate:  [18 bmp] 18 bmp Vt Set:  [520 mL] 520 mL PEEP:  [5 cmH20] 5 cmH20 Plateau Pressure:  [36 cmH20] 36 cmH20   Intake/Output Summary (Last 24 hours) at 08/08/2022 1434 Last data filed at 08/08/2022 1300 Gross per 24 hour  Intake 1406.27 ml  Output 435 ml  Net 971.27 ml   Filed Weights   08/06/22 1726 08/07/22 0500 08/08/22 0500  Weight: 90.2 kg 90.9 kg 95.3 kg   Physical Examination: General: Acutely ill-appearing elderly man in NAD. Somnolent, intermittently agitated. HEENT: [place]/AT, anicteric sclera, PERRL, moist mucous membranes. Copious oral secretions. Neck: Bulky cervical lymphadenopathy bilaterally. Neuro: Lethargic. Intermittently agitated. Responds to verbal stimuli. Following commands intermittently. Moves all 4 extremities spontaneously. Generalized weakness. CV: RRR, no m/g/r. PULM: Breathing even and unlabored on 2LNC. Lung fields with scattered rhonci, loud upper airway sounds. GI: Soft, nontender, nondistended. Normoactive bowel sounds. Extremities: Bilateral symmetric 1+ LE edema noted. Skin: Warm/dry, no rashes.  Resolved Hospital Problem List:    Assessment & Plan:  Fever of unknown origin, presumed to be lymphoma-related Consider immune reconstitution component, ?tumor lysis. - Goal MAP > 65 - Fluid resuscitation limited in the setting of ARF - If unable to maintain pressures, begin Levophed titrated to goal MAP - Trend WBC, fever curve, LA -  F/u Cx data, currently NGTD - S/p cefepime x 7 days, vanc, Flagyl  Acute renal failure in the setting of sepsis - Nephro following, appreciate recommendations - Plan for CRRT initiation today, 6/13 - RIJ Trialysis catheter placed - Trend BMP -  Replete electrolytes as indicated - Monitor I&Os - Avoid nephrotoxic agents as able - Ensure adequate renal perfusion  Acute hypoxemic respiratory failure Concern for aspiration - Intubated for airway protection, gurgling with copious secretions 6/13AM - Continue full vent support (4-8cc/kg IBW) - Wean FiO2 for O2 sat > 90% - Daily WUA/SBT as mental status tolerates - VAP bundle - Pulmonary hygiene - PAD protocol for sedation: Precedex, Fentanyl, and Versed for goal RASS 0 to -1  Paroxysmal Afib History of PE CHA2DS2-VASc Score 4, not on AC due to history of bleeding/pelvic hematoma. Echo 6/3 with LVEF 75%, hyperdynamic; no RWMAs, mild LVH. - Continue amiodarone - Cardiac monitoring - Optimize electrolytes for K > 4, Mg > 2 - Hold AC  Follicular lymphoma History of MDS Prostate CA - Oncology following - Bulky lymphadenopathy noted bilaterally on bedside POCUS, consistent with cervical/hilar LAD  Best Practice: (right click and "Reselect all SmartList Selections" daily)   Diet/type: NPO DVT prophylaxis: SCDs GI prophylaxis: PPI Lines: Central line and Dialysis Catheter Foley:  Yes, and it is still needed Code Status:  full code Last date of multidisciplinary goals of care discussion [6/13 - Discussed with patient's daughter via phone, verbally consented for Trialysis/CRRT initiation and intubation; she states that she spoke with her father prior to his AMS and he "wanted everything".]  Labs:  CBC: Recent Labs  Lab 08/03/22 0848 08/03/22 1929 08/05/22 0617 08/06/22 0421 08/06/22 1107 08/07/22 0601 08/08/22 0355  WBC 9.0  --  9.3 7.7  --  7.7 6.4  NEUTROABS  --   --   --   --   --  6.2 5.5  HGB 6.6*   < > 7.6* 6.3* 7.7* 7.5* 7.3*  HCT 20.7*   < > 24.5* 20.4* 24.3* 24.3* 23.2*  MCV 83.8  --  86.9 88.7  --  89.0 87.9  PLT 95*  --  96* 83*  --  60* 42*   < > = values in this interval not displayed.   Basic Metabolic Panel: Recent Labs  Lab 08/02/22 0004  08/03/22 0848 08/05/22 0617 08/06/22 0421 08/07/22 0601 08/08/22 0355  NA 125* 126* 126* 128* 128* 132*  K 4.0 4.1 5.5* 5.7* 5.1 5.6*  CL 97* 96* 98 98 95* 94*  CO2 19* 19* 18* 19* 18* 22  GLUCOSE 117* 111* 113* 88 118* 131*  BUN 24* 31* 43* 60* 73* 97*  CREATININE 0.82 0.97 1.36* 2.38* 3.72* 4.89*  CALCIUM 7.5* 8.1* 8.8* 9.0 8.7* 8.3*  MG 1.7  --   --   --  2.6* 2.9*  PHOS 2.8  --   --   --   --  11.6*   GFR: Estimated Creatinine Clearance: 15.5 mL/min (A) (by C-G formula based on SCr of 4.89 mg/dL (H)). Recent Labs  Lab 08/05/22 0617 08/06/22 0421 08/07/22 0601 08/08/22 0355  WBC 9.3 7.7 7.7 6.4   Liver Function Tests: Recent Labs  Lab 08/05/22 0617 08/06/22 0421 08/07/22 0601 08/08/22 0355  AST 76* 65* 67* 63*  ALT 38 37 39 34  ALKPHOS 94 96 88 84  BILITOT 1.9* 2.3* 2.8* 2.4*  PROT 5.0* 4.7* 4.9* 4.9*  ALBUMIN 1.9* 2.0* 1.9* 1.9*   No results for input(s): "LIPASE", "  AMYLASE" in the last 168 hours. No results for input(s): "AMMONIA" in the last 168 hours.  ABG: No results found for: "PHART", "PCO2ART", "PO2ART", "HCO3", "TCO2", "ACIDBASEDEF", "O2SAT"   Coagulation Profile: No results for input(s): "INR", "PROTIME" in the last 168 hours.  Cardiac Enzymes: No results for input(s): "CKTOTAL", "CKMB", "CKMBINDEX", "TROPONINI" in the last 168 hours.  HbA1C: No results found for: "HGBA1C"  CBG: Recent Labs  Lab 08/06/22 1723 08/07/22 1221  GLUCAP 99 106*   Review of Systems:   Review of systems completed with pertinent positives/negatives outlined in above HPI.  Past Medical History:  He,  has a past medical history of Cancer (HCC), Follicular lymphoma (HCC) (2024), History of pulmonary embolism, Hypertension, Myelodysplastic syndrome (HCC), PAF (paroxysmal atrial fibrillation) (HCC), and Recurrent UTI.   Surgical History:   Past Surgical History:  Procedure Laterality Date   LYMPHADENECTOMY Bilateral 09/01/2019   Procedure: LYMPHADENECTOMY;   Surgeon: Sebastian Ache, MD;  Location: [facility];  Service: Urology;  Laterality: Bilateral;   ROBOT ASSISTED LAPAROSCOPIC RADICAL PROSTATECTOMY N/A 09/01/2019   Procedure: XI ROBOTIC ASSISTED LAPAROSCOPIC RADICAL PROSTATECTOMY;  Surgeon: Sebastian Ache, MD;  Location: [facility];  Service: Urology;  Laterality: N/A;  3 HRS    Social History:   reports that he has never smoked. He has never used smokeless tobacco. He reports current alcohol use. He reports that he does not use drugs.   Family History:  His family history includes Stroke in his father.   Allergies: Allergies  Allergen Reactions   Amitriptyline Other (See Comments)    Nightmares, Night sweats, Migraines    Amlodipine Swelling    BILATERAL FEET TO ANKLES   Neurontin [Gabapentin] Other (See Comments)    Nightmares; Night sweats; Headache    Home Medications: Prior to Admission medications   Medication Sig Start Date End Date Taking? Authorizing Provider  allopurinol (ZYLOPRIM) 100 MG tablet Take 1 tablet (100 mg total) by mouth 2 (two) times daily. 07/19/22  Yes Johney Maine, MD  B Complex Vitamins (VITAMIN B COMPLEX) TABS Take 1 tablet by mouth daily. 12/21/21  Yes Amin, Loura Halt, MD  cholecalciferol (VITAMIN D3) 25 MCG (1000 UNIT) tablet Take 2 tablets (2,000 Units total) by mouth daily. 01/21/22  Yes Johney Maine, MD  diclofenac Sodium (VOLTAREN) 1 % GEL APPLY 4 G TOPICALLY 4 (FOUR) TIMES DAILY. APPLY TO RIGHT LATERAL HIP AND THIGH DOWN TO KNEE Patient taking differently: Apply 4 g topically daily as needed (For pain). 06/12/22  Yes Nestor Ramp, MD  folic acid (FOLVITE) 1 MG tablet Take 1 mg by mouth daily. 03/27/22  Yes [provider]  lidocaine-prilocaine (EMLA) cream Apply to affected area once Patient taking differently: Apply 1 Application topically daily as needed (For port). 07/26/22  Yes Johney Maine, MD  lisinopril (ZESTRIL) 5 MG tablet Take 5 mg by mouth daily.   Yes [provider]  Menthol-Methyl Salicylate (MUSCLE RUB) 10-15 % CREA Apply 1 Application topically daily as needed for muscle pain.   Yes [provider]  metoCLOPramide (REGLAN) 10 MG tablet Take 1 tablet (10 mg total) by mouth every 6 (six) hours as needed for nausea. 07/15/22  Yes Gerhard Munch, MD  Multiple Vitamin (MULTIVITAMIN WITH MINERALS) TABS tablet Take 1 tablet by mouth daily. 12/21/21  Yes Amin, Ankit Chirag, MD  ondansetron (ZOFRAN) 8 MG tablet Take 1 tablet (8 mg total) by mouth every 8 (eight) hours as needed for nausea or vomiting. Start on the  third day after cyclophosphamide chemotherapy. 07/26/22  Yes Johney Maine, MD  oxyCODONE (OXY IR/ROXICODONE) 5 MG immediate release tablet Take 1 tablet (5 mg total) by mouth every 6 (six) hours as needed for severe pain. 07/19/22  Yes Johney Maine, MD  prochlorperazine (COMPAZINE) 10 MG tablet Take 1 tablet (10 mg total) by mouth every 6 (six) hours as needed for nausea or vomiting. 07/26/22  Yes Johney Maine, MD  senna-docusate (SENNA S) 8.6-50 MG tablet Take 2 tablets by mouth at bedtime as needed for mild constipation. 07/19/22  Yes Johney Maine, MD  acyclovir (ZOVIRAX) 400 MG tablet Take 1 tablet (400 mg total) by mouth daily. Patient not taking: Reported on 07/29/2022 07/26/22   Johney Maine, MD  allopurinol (ZYLOPRIM) 100 MG tablet Take 1 tablet (100 mg total) by mouth 2 (two) times daily. Patient not taking: Reported on 07/29/2022 07/26/22   Johney Maine, MD  amitriptyline (ELAVIL) 10 MG tablet TAKE 1 TABLET BY MOUTH EVERYDAY AT BEDTIME Patient not taking: Reported on 07/29/2022 07/23/22   Nestor Ramp, MD  gabapentin (NEURONTIN) 100 MG capsule Take 1 capsule (100 mg total) by mouth at bedtime for 14 days, THEN 2 capsules (200 mg total) at bedtime. 05/31/22 07/14/22  Claudie Leach, DO  predniSONE (DELTASONE) 20 MG tablet Take 3 tablets (60 mg total) by mouth daily. Take with food on days  2-6 of chemotherapy. Patient not taking: Reported on 07/29/2022 07/26/22   Johney Maine, MD    Critical care time:   The patient is critically ill with multiple organ system failure and requires high complexity decision making for assessment and support, frequent evaluation and titration of therapies, advanced monitoring, review of radiographic studies and interpretation of complex data.   Critical Care Time devoted to patient care services, exclusive of separately billable procedures, described in this note is 39 minutes.  Tim Lair, PA-C [facility] 08/08/22 2:34 PM  Please see [email or web address] for pager details.  From 7A-7P if no response, please call [phone] After hours, please call ELink [phone]

## 2022-08-08 NOTE — Progress Notes (Signed)
eLink Physician-Brief Progress Note Patient Name: Tyler [name] DOB: 10/10/1950 MRN: [id number]   Date of Service  08/08/2022  HPI/Events of Note  Pt was being suctioned and he suddenly became agitated, almost pulling out his ETT.  Given due PRN sedation Pt receiving adequate TV on vent. Maintaining adequatre  eICU Interventions  Placed order for continuous fentnayl for sedation, bilateral wrist restraints.  Will recheck STAT CXR to ensure ETT remains in appropriate position.         [name] M DELA CRUZ 08/08/2022, 11:19 PM

## 2022-08-09 ENCOUNTER — Inpatient Hospital Stay (HOSPITAL_COMMUNITY): Payer: 59

## 2022-08-09 ENCOUNTER — Ambulatory Visit: Payer: 59

## 2022-08-09 DIAGNOSIS — C8590 Non-Hodgkin lymphoma, unspecified, unspecified site: Secondary | ICD-10-CM

## 2022-08-09 DIAGNOSIS — J9601 Acute respiratory failure with hypoxia: Secondary | ICD-10-CM | POA: Diagnosis not present

## 2022-08-09 DIAGNOSIS — E883 Tumor lysis syndrome: Secondary | ICD-10-CM | POA: Diagnosis present

## 2022-08-09 DIAGNOSIS — N179 Acute kidney failure, unspecified: Secondary | ICD-10-CM | POA: Diagnosis not present

## 2022-08-09 LAB — RENAL FUNCTION PANEL
Albumin: 1.9 g/dL — ABNORMAL LOW (ref 3.5–5.0)
Albumin: 1.8 g/dL — ABNORMAL LOW (ref 3.5–5.0)
Albumin: 1.9 g/dL — ABNORMAL LOW (ref 3.5–5.0)
Anion gap: 16 — ABNORMAL HIGH (ref 5–15)
Anion gap: 11 (ref 5–15)
Anion gap: 13 (ref 5–15)
BUN: 81 mg/dL — ABNORMAL HIGH (ref 8–23)
BUN: 60 mg/dL — ABNORMAL HIGH (ref 8–23)
BUN: 61 mg/dL — ABNORMAL HIGH (ref 8–23)
CO2: 19 mmol/L — ABNORMAL LOW (ref 22–32)
CO2: 25 mmol/L (ref 22–32)
CO2: 23 mmol/L (ref 22–32)
Calcium: 6.8 mg/dL — ABNORMAL LOW (ref 8.9–10.3)
Calcium: 6.9 mg/dL — ABNORMAL LOW (ref 8.9–10.3)
Calcium: 7 mg/dL — ABNORMAL LOW (ref 8.9–10.3)
Chloride: 95 mmol/L — ABNORMAL LOW (ref 98–111)
Chloride: 97 mmol/L — ABNORMAL LOW (ref 98–111)
Chloride: 98 mmol/L (ref 98–111)
Creatinine, Ser: 3.31 mg/dL — ABNORMAL HIGH (ref 0.61–1.24)
Creatinine, Ser: 2.29 mg/dL — ABNORMAL HIGH (ref 0.61–1.24)
Creatinine, Ser: 2.18 mg/dL — ABNORMAL HIGH (ref 0.61–1.24)
GFR, Estimated: 19 mL/min — ABNORMAL LOW (ref >60)
GFR, Estimated: 30 mL/min — ABNORMAL LOW (ref >60)
GFR, Estimated: 32 mL/min — ABNORMAL LOW (ref >60)
Glucose, Bld: 144 mg/dL — ABNORMAL HIGH (ref 70–99)
Glucose, Bld: 206 mg/dL — ABNORMAL HIGH (ref 70–99)
Glucose, Bld: 205 mg/dL — ABNORMAL HIGH (ref 70–99)
Phosphorus: 8.2 mg/dL — ABNORMAL HIGH (ref 2.5–4.6)
Phosphorus: 6.5 mg/dL — ABNORMAL HIGH (ref 2.5–4.6)
Phosphorus: 6.3 mg/dL — ABNORMAL HIGH (ref 2.5–4.6)
Potassium: 5.9 mmol/L — ABNORMAL HIGH (ref 3.5–5.1)
Potassium: 3.8 mmol/L (ref 3.5–5.1)
Potassium: 3.6 mmol/L (ref 3.5–5.1)
Sodium: 130 mmol/L — ABNORMAL LOW (ref 135–145)
Sodium: 133 mmol/L — ABNORMAL LOW (ref 135–145)
Sodium: 134 mmol/L — ABNORMAL LOW (ref 135–145)

## 2022-08-09 LAB — GLUCOSE, CAPILLARY
Glucose-Capillary: 133 mg/dL — ABNORMAL HIGH (ref 70–99)
Glucose-Capillary: 132 mg/dL — ABNORMAL HIGH (ref 70–99)
Glucose-Capillary: 158 mg/dL — ABNORMAL HIGH (ref 70–99)
Glucose-Capillary: 175 mg/dL — ABNORMAL HIGH (ref 70–99)
Glucose-Capillary: 167 mg/dL — ABNORMAL HIGH (ref 70–99)
Glucose-Capillary: 168 mg/dL — ABNORMAL HIGH (ref 70–99)

## 2022-08-09 LAB — CULTURE, RESPIRATORY W GRAM STAIN

## 2022-08-09 LAB — BASIC METABOLIC PANEL
Anion gap: 13 (ref 5–15)
BUN: 66 mg/dL — ABNORMAL HIGH (ref 8–23)
CO2: 24 mmol/L (ref 22–32)
Calcium: 7.2 mg/dL — ABNORMAL LOW (ref 8.9–10.3)
Chloride: 98 mmol/L (ref 98–111)
Creatinine, Ser: 2.27 mg/dL — ABNORMAL HIGH (ref 0.61–1.24)
GFR, Estimated: 30 mL/min — ABNORMAL LOW (ref >60)
Glucose, Bld: 170 mg/dL — ABNORMAL HIGH (ref 70–99)
Potassium: 4.2 mmol/L (ref 3.5–5.1)
Sodium: 135 mmol/L (ref 135–145)

## 2022-08-09 LAB — CBC WITH DIFFERENTIAL/PLATELET
Abs Immature Granulocytes: 0.77 10*3/uL — ABNORMAL HIGH (ref 0.00–0.07)
Basophils Absolute: 0 10*3/uL (ref 0.0–0.1)
Basophils Relative: 0 %
Eosinophils Absolute: 0 10*3/uL (ref 0.0–0.5)
Eosinophils Relative: 0 %
HCT: 26.7 % — ABNORMAL LOW (ref 39.0–52.0)
Hemoglobin: 8.4 g/dL — ABNORMAL LOW (ref 13.0–17.0)
Immature Granulocytes: 7 %
Lymphocytes Relative: 6 %
Lymphs Abs: 0.7 10*3/uL (ref 0.7–4.0)
MCH: 27.2 pg (ref 26.0–34.0)
MCHC: 31.5 g/dL (ref 30.0–36.0)
MCV: 86.4 fL (ref 80.0–100.0)
Monocytes Absolute: 0.2 10*3/uL (ref 0.1–1.0)
Monocytes Relative: 2 %
Neutro Abs: 9 10*3/uL — ABNORMAL HIGH (ref 1.7–7.7)
Neutrophils Relative %: 85 %
Platelets: 40 10*3/uL — ABNORMAL LOW (ref 150–400)
RBC: 3.09 MIL/uL — ABNORMAL LOW (ref 4.22–5.81)
RDW: 18.5 % — ABNORMAL HIGH (ref 11.5–15.5)
WBC: 10.7 10*3/uL — ABNORMAL HIGH (ref 4.0–10.5)
nRBC: 0 % (ref 0.0–0.2)

## 2022-08-09 LAB — HEPATIC FUNCTION PANEL
ALT: 31 U/L (ref 0–44)
AST: 62 U/L — ABNORMAL HIGH (ref 15–41)
Albumin: 1.9 g/dL — ABNORMAL LOW (ref 3.5–5.0)
Alkaline Phosphatase: 83 U/L (ref 38–126)
Bilirubin, Direct: 2.2 mg/dL — ABNORMAL HIGH (ref 0.0–0.2)
Indirect Bilirubin: 2.3 mg/dL — ABNORMAL HIGH (ref 0.3–0.9)
Total Bilirubin: 4.5 mg/dL — ABNORMAL HIGH (ref 0.3–1.2)
Total Protein: 4.9 g/dL — ABNORMAL LOW (ref 6.5–8.1)

## 2022-08-09 LAB — URIC ACID: Uric Acid, Serum: 4.8 mg/dL (ref 3.7–8.6)

## 2022-08-09 LAB — MAGNESIUM: Magnesium: 3 mg/dL — ABNORMAL HIGH (ref 1.7–2.4)

## 2022-08-09 MED ORDER — PRISMASOL BGK 4/2.5 32-4-2.5 MEQ/L EC SOLN: Status: DC

## 2022-08-09 MED ORDER — PROSOURCE TF20 ENFIT COMPATIBL EN LIQD
60.0000 mL | Freq: Three times a day (TID) | ENTERAL | Status: DC
  Administered 2022-08-09 – 2022-08-16 (×21): 60 mL
  Filled 2022-08-09 (×21): qty 60

## 2022-08-09 MED ORDER — B COMPLEX-C PO TABS
1.0000 | ORAL_TABLET | Freq: Every day | ORAL | Status: DC
  Administered 2022-08-09 – 2022-08-20 (×11): 1
  Filled 2022-08-09 (×12): qty 1

## 2022-08-09 MED ORDER — DEXMEDETOMIDINE HCL IN NACL 400 MCG/100ML IV SOLN
0.0000 ug/kg/h | INTRAVENOUS | Status: DC
  Administered 2022-08-09 (×2): 1 ug/kg/h via INTRAVENOUS
  Administered 2022-08-09 (×3): 1.2 ug/kg/h via INTRAVENOUS
  Administered 2022-08-09: 1 ug/kg/h via INTRAVENOUS
  Administered 2022-08-10 (×2): 0.6 ug/kg/h via INTRAVENOUS
  Administered 2022-08-10: 1.2 ug/kg/h via INTRAVENOUS
  Administered 2022-08-10: 0.8 ug/kg/h via INTRAVENOUS
  Administered 2022-08-11 (×2): 1 ug/kg/h via INTRAVENOUS
  Filled 2022-08-09 (×10): qty 100
  Filled 2022-08-09: qty 200

## 2022-08-09 MED ORDER — INSULIN ASPART 100 UNIT/ML IJ SOLN
0.0000 [IU] | INTRAMUSCULAR | Status: DC
  Administered 2022-08-09 – 2022-08-12 (×11): 1 [IU] via SUBCUTANEOUS

## 2022-08-09 MED ORDER — PRISMASOL BGK 0/2.5 32-2.5 MEQ/L EC SOLN: Status: DC

## 2022-08-09 MED ORDER — PRISMASOL BGK 0/2.5 32-2.5 MEQ/L REPLACEMENT SOLN: Status: DC

## 2022-08-09 MED ORDER — VITAL 1.5 CAL PO LIQD
1000.0000 mL | ORAL | Status: DC
  Administered 2022-08-09 – 2022-08-17 (×11): 1000 mL
  Filled 2022-08-09 (×17): qty 1000

## 2022-08-09 NOTE — Progress Notes (Signed)
eLink Physician-Brief Progress Note Patient Name: Tyler [name] DOB: 1951/01/05 MRN: [id number]   Date of Service  08/09/2022  HPI/Events of Note  Blood sugar 175  mg / dl.  eICU Interventions  Humalog SSI regimen ordered, BMP ordered.        Migdalia Dk 08/09/2022, 9:56 PM

## 2022-08-09 NOTE — Progress Notes (Signed)
[facility] Progress Note  Subjective: pt on the vent, CRRT going well, no clotting issues. K+ has stayed up, uric acid down to   Vitals:   08/09/22 1330 08/09/22 1345 08/09/22 1400 08/09/22 1415  BP: 120/65 120/67 114/65 (!) 92/51  Pulse: 78 77 80 82  Resp: 18 18 18 18   Temp:      TempSrc:      SpO2: 97% 97% 97% 93%  Weight:      Height:        Exam: Gen alert, chronically ill appearing elderly male No rash, cyanosis or gangrene Sclera anicteric, throat clear  No jvd or bruits Chest clear bilat to bases, no rales/ wheezing RRR no RG Abd soft ntnd no mass or ascites +bs GU foley cath draining minimal urine MS no joint effusions or deformity Ext diffuse 1-2 LE > UE pitting edema Neuro is barely awake, more sommolent, not responding verbally      Home meds include - allopurinol, lisniopril 5 qd, reglan, MVI, elavil, gabapentin, prednisone, prns/ vits/ supps       UA turbid, 100 prot, few bact, 6-10 rbc/ 21-50 wbc, 0-5 epis    UNa < 10, UCr 130      Renal US - 10.4/ 10.8 cm kidneys w/o hydro, normal echo    CXR 6/10 - clear lungs, bilat LAN     Assessment/ Plan: AKI - b/l creatinine 0.6- 0.9 here on admission. Creat rose up to 1.3 on 6/10 and 2.3 today 6/11.  UA shows ^wbc, min rbc's/ protein. Renal US shows no obstruction. He did receive IV contrast on 6/08, had some hypotension also on 6/08- 09. Uric acid was also high which could indicate tumor lysis syndrome. ECHO done here showed normal LVEF. Suspect AKI due to TLS +  IV contrast +/- intravasc volume depletion/ low alb/ cap leak. Pt rec'd rasburicase 6/12 and again on 6/13 per ONC.  CRRT was started 6/13 for worsening MS. Cont CRRT.  Hypotension - on levo pressor support at 5- 10 mcg/min.  Follicular B cell lymphoma - started chemoRx 6/12, on hold now for AKI Volume - is up many kg and w/ sig edema. Last CXR was clear on 6/10. Continue 50- 75 cc/ hr net negative.  Anemia/ pancytopenia - per pmd/  oncology N/V/ abd distension - NG placed w/ large outpt Parox atrial fib      Rob Demonta Wombles MD [facility] 08/09/2022, 3:59 PM  Recent Labs  Lab 08/08/22 0355 08/08/22 1612 08/09/22 0328 08/09/22 0339 08/09/22 0520  HGB 7.3*  --   --   --  8.4*  ALBUMIN 1.9* 1.9* 1.9* 1.9*  --   CALCIUM 8.3* 7.3*  --  6.8*  --   PHOS 11.6* 11.8*  --  8.2*  --   CREATININE 4.89* 4.62*  --  3.31*  --   K 5.6* 5.8*  --  5.9*  --     No results for input(s): "IRON", "TIBC", "FERRITIN" in the last 168 hours. Inpatient medications:  acetaminophen  650 mg Per Tube Once   allopurinol  100 mg Per Tube BID   amiodarone  400 mg Per NG tube BID   Followed by   Melene Muller ON 08/10/2022] amiodarone  200 mg Per Tube Daily   B-complex with vitamin C  1 tablet Per Tube Daily   Chlorhexidine Gluconate Cloth  6 each Topical Q2200   docusate  100 mg Per Tube BID   feeding supplement (PROSource TF20)  60 mL  Per Tube TID   methylPREDNISolone (SOLU-MEDROL) injection  80 mg Intravenous Daily   mouth rinse  15 mL Mouth Rinse Q2H   pantoprazole (PROTONIX) IV  40 mg Intravenous Daily   polyethylene glycol  17 g Per Tube Daily   senna  2 tablet Per NG tube Daily   sodium chloride flush  10-40 mL Intracatheter Q12H   Tbo-filgastrim (GRANIX) SQ  480 mcg Subcutaneous q1800    sodium chloride 10 mL/hr at 08/09/22 1500   dexmedetomidine (PRECEDEX) IV infusion 1 mcg/kg/hr (08/09/22 1536)   feeding supplement (VITAL 1.5 CAL) 20 mL/hr at 08/09/22 1500   fentaNYL infusion INTRAVENOUS 25 mcg/hr (08/09/22 1500)   norepinephrine (LEVOPHED) Adult infusion 7 mcg/min (08/09/22 1500)   piperacillin-tazobactam 3.375 g (08/09/22 1506)   prismasol BGK 0/2.5 400 mL/hr at 08/09/22 1220   prismasol BGK 0/2.5 400 mL/hr at 08/09/22 1223   prismasol BGK 0/2.5 1,500 mL/hr at 08/09/22 1547   sodium bicarbonate 150 mEq in sterile water 1,150 mL infusion 50 mL/hr at 08/09/22 1500   sodium chloride, chlorproMAZINE, fentaNYL (SUBLIMAZE) injection,  heparin sodium (porcine), ipratropium-albuterol, lip balm, midazolam, ondansetron **OR** ondansetron (ZOFRAN) IV, mouth rinse, sodium chloride flush

## 2022-08-09 NOTE — Progress Notes (Signed)
Patient being suctioned and woke up very agitated, patient pulling tubes and pulling @ ETT. Patient disconnected himself from vent and almost self extubated, called out for help from other Rns. Were able to connect patient back to the vent, TV good, Sp02 good, Elink and RT called. CXR obtained to check placement of ETT.

## 2022-08-09 NOTE — Progress Notes (Signed)
Oncology Short Note  Chart reviewed. Patient continues to be on vent support for hypoxic respiratory failure from aspiration pneumonitis. On CRRT. _continue granix daily to avoid chemotherapy related neutropenia. - transfuse PRBC prn for hgb<8 -transfuse platelets prn for PLT<20k -holding RItuxan for now -patient on zosyn for pneumonia related to aspiration vs HCAP. -oncology will reassess on Monday regarding continued rx. -ongoing goals of care discussion.  Wyvonnia Lora MD MS

## 2022-08-09 NOTE — Progress Notes (Signed)
Dialysate concentration changed on previous shift per Nephro. Elink called to ask about rechecking BMP for K+ level since solution change. CBG elevated, Elink made aware of high CBG.

## 2022-08-09 NOTE — Progress Notes (Signed)
NAME:  Jenner Skau, MRN:  [id number], DOB:  [birth year], LOS: 11 ADMISSION DATE:  07/29/2022 CONSULTATION DATE:  08/08/2022 REFERRING MD:  Hanley Ben - [facility] CHIEF COMPLAINT: Sepsis, ARF with need for RRT   History of Present Illness:  72 year old man who presented to [facility] 6/3 for abdominal pain, near-syncope. PMHx significant for HTN, PAF (not on AC), history of PE, MDS with follicular B-cell lymphoma, prostate CA, recurrent UTIs. Presented 11/2021 for pancytopenia, found to have MDS with eventual diagnosis of follicular B-cell lymphoma. Followed by Oncology.  History is obtained primarily from chart review. Patient reportedly was having fevers at home with near-syncope when his daughter brought him into the ED. Endorsed poor appetite for "a long time" with poor PO intake. On ED arrival, patient was febrile to Tmax 100.76F tachycardic to 100s and hypotensive with BP 85/73, LA > 5. WBC 13.8, Hgb 6.4, Plt 109. K 4.4, Cr 1.01. Admitted to [facility].  PCCM consulted 6/13 for worsening ARF and need for CRRT.  Pertinent Medical History:   Past Medical History:  Diagnosis Date   Cancer (HCC)    prostate   Follicular lymphoma (HCC) 2024   History of pulmonary embolism    Hypertension    Myelodysplastic syndrome (HCC)    PAF (paroxysmal atrial fibrillation) (HCC)    Recurrent UTI    Significant Hospital Events: Including procedures, antibiotic start and stop dates in addition to other pertinent events   6/3 - Presented to ED for fever, near-syncope. Presumed sepsis. Admitted to [facility]. 6/13 - Worsening acute renal failure as evidenced by rising BUN/Cr, hyperkalemia to 5.6. PCCM consulted for ARF/need for RRT, line placement. Intubated. R IJ Trialysis catheter placed. CRRT started.  Interim History / Subjective:  Brief period of agitation overnight, nearly self-extubated/self-discontinued Trialysis/PICC Sedation adjusted and restraints in place Remains on CRRT, -4L/24H Continued -50 to -75/hr UF NE ,  otherwise hemodynamically stable on CRRT Electrolyte derangements (K, Ca, Phos) Tbili uptrending  Objective:  Blood pressure (!) 95/55, pulse 82, temperature 98.2 F (36.8 C), temperature source Axillary, resp. rate 18, height 5\' 7"  (1.702 m), weight 86.3 kg, SpO2 99 %.    Vent Mode: PRVC FiO2 (%):  [40 %-100 %] 40 % Set Rate:  [18 bmp] 18 bmp Vt Set:  [530 mL] 530 mL PEEP:  [5 cmH20] 5 cmH20 Plateau Pressure:  [13 cmH20-36 cmH20] 19 cmH20   Intake/Output Summary (Last 24 hours) at 08/09/2022 0727 Last data filed at 08/09/2022 0700 Gross per 24 hour  Intake 3012.93 ml  Output 4096.1 ml  Net -1083.17 ml    Filed Weights   08/07/22 0500 08/08/22 0500 08/09/22 0500  Weight: 90.9 kg 95.3 kg 86.3 kg   Physical Examination: General: Acutely ill-appearing elderly man in NAD. Intubated, sedated. HEENT: [place]/AT, anicteric sclera, PERRL 2mm (sluggish), moist mucous membranes. Neuro: Sedated. Responds to noxious stimuli. Not following commands. Moves all 4 extremities spontaneously. +Corneal, +Cough, and +Gag  CV: RRR, no m/g/r. PULM: Breathing even and unlabored on vent (PEEP 5, FiO2 30%). Lung fields with scattered rhonchi. GI: Soft, nontender, nondistended. Normoactive bowel sounds. Extremities: Bilateral symmetric 1+ LE edema noted. Skin: Warm/mildly diaphoretic, no rashes.  Resolved Hospital Problem List:    Assessment & Plan:  Fever of unknown origin, presumed to be lymphoma-related Consider immune reconstitution component, ?tumor lysis. - Goal MAP > 65 - Levophed titrated to goal MAP - Trend WBC, fever curve, LA - F/u Cx data, NGTD - S/p cefepime x 7 days, vanc, Flagyl - Zosyn for ?  aspiration  Acute renal failure in the setting of sepsis S/p RIJ Trialysis catheter placement 6/13. - Nephro following, appreciate management - CRRT started 6/13, goal UF 50-75/hr - Trend BMP - Replete electrolytes as indicated - Monitor I&Os - Avoid nephrotoxic agents as able - Ensure  adequate renal perfusion  Acute hypoxemic respiratory failure Concern for aspiration Intubated for airway protection, gurgling with copious secretions 6/13AM. - Continue full vent support (4-8cc/kg IBW), decreased secretions - Wean FiO2 for O2 sat > 90% - Daily WUA/SBT as mental status allows - VAP bundle - Pulmonary hygiene - PAD protocol for sedation: Precedex and Fentanyl for goal RASS 0 to -1 - Continue Zosyn as above  Paroxysmal Afib History of PE CHA2DS2-VASc Score 4, not on AC due to history of bleeding/pelvic hematoma. Echo 6/3 with LVEF 75%, hyperdynamic; no RWMAs, mild LVH. - Continue amiodarone - Cardiac monitoring - Optimize electrolytes for K > 4, Mg > 2 - Hold AC in the setting of uremia, thrombocytopenia  Elevated Tbili Related to tumor lysis vs hemolysis - Trend LFTs  Follicular lymphoma History of MDS Prostate CA - Oncology following - Bulky lymphadenopathy noted bilaterally on bedsode POCUS  Best Practice: (right click and "Reselect all SmartList Selections" daily)   Diet/type: NPO DVT prophylaxis: SCDs GI prophylaxis: PPI Lines: Central line and Dialysis Catheter Foley:  Yes, and it is still needed Code Status:  full code Last date of multidisciplinary goals of care discussion [6/13 - Discussed with patient's daughter via phone, verbally consented for Trialysis/CRRT initiation and intubation; she states that she spoke with her father prior to his AMS and he "wanted everything".]  Critical care time:   The patient is critically ill with multiple organ system failure and requires high complexity decision making for assessment and support, frequent evaluation and titration of therapies, advanced monitoring, review of radiographic studies and interpretation of complex data.   Critical Care Time devoted to patient care services, exclusive of separately billable procedures, described in this note is 35 minutes.  Tim Lair, PA-C [facility] 08/09/22 7:27 AM  Please see [email or web address] for pager details.  From 7A-7P if no response, please call [phone] After hours, please call ELink [phone]

## 2022-08-09 NOTE — Plan of Care (Signed)
  Problem: Nutrition: Goal: Adequate nutrition will be maintained Outcome: Progressing   Problem: Coping: Goal: Level of anxiety will decrease Outcome: Progressing   Problem: Elimination: Goal: Will not experience complications related to bowel motility Outcome: Progressing Goal: Will not experience complications related to urinary retention Outcome: Progressing   Problem: Pain Managment: Goal: General experience of comfort will improve Outcome: Progressing   Problem: Safety: Goal: Ability to remain free from injury will improve Outcome: Progressing   Problem: Skin Integrity: Goal: Risk for impaired skin integrity will decrease Outcome: Progressing   Problem: Respiratory: Goal: Ability to maintain a clear airway and adequate ventilation will improve Outcome: Progressing   Problem: Safety: Goal: Non-violent Restraint(s) Outcome: Progressing   Problem: Nutritional: Goal: Maintenance of adequate nutrition will improve Outcome: Progressing Goal: Progress toward achieving an optimal weight will improve Outcome: Progressing   Problem: Skin Integrity: Goal: Risk for impaired skin integrity will decrease Outcome: Progressing   Problem: Tissue Perfusion: Goal: Adequacy of tissue perfusion will improve Outcome: Progressing   Problem: Fluid Volume: Goal: Hemodynamic stability will improve Outcome: Progressing   Problem: Clinical Measurements: Goal: Diagnostic test results will improve Outcome: Progressing Goal: Signs and symptoms of infection will decrease Outcome: Progressing   Problem: Respiratory: Goal: Ability to maintain adequate ventilation will improve Outcome: Progressing   Problem: Cardiac: Goal: Will achieve and/or maintain adequate cardiac output Outcome: Progressing   Problem: Education: Goal: Knowledge of General Education information will improve Description: Including pain rating scale, medication(s)/side effects and non-pharmacologic comfort  measures Outcome: Not Progressing   Problem: Health Behavior/Discharge Planning: Goal: Ability to manage health-related needs will improve Outcome: Not Progressing   Problem: Clinical Measurements: Goal: Ability to maintain clinical measurements within normal limits will improve Outcome: Not Progressing Goal: Will remain free from infection Outcome: Not Progressing Goal: Diagnostic test results will improve Outcome: Not Progressing Goal: Respiratory complications will improve Outcome: Not Progressing Goal: Cardiovascular complication will be avoided Outcome: Not Progressing   Problem: Activity: Goal: Risk for activity intolerance will decrease Outcome: Not Progressing   Problem: Activity: Goal: Ability to tolerate increased activity will improve Outcome: Not Progressing   Problem: Role Relationship: Goal: Method of communication will improve Outcome: Not Progressing   Problem: Education: Goal: Ability to describe self-care measures that may prevent or decrease complications (Diabetes Survival Skills Education) will improve Outcome: Not Progressing Goal: Individualized Educational Video(s) Outcome: Not Progressing   Problem: Coping: Goal: Ability to adjust to condition or change in health will improve Outcome: Not Progressing   Problem: Fluid Volume: Goal: Ability to maintain a balanced intake and output will improve Outcome: Not Progressing   Problem: Health Behavior/Discharge Planning: Goal: Ability to identify and utilize available resources and services will improve Outcome: Not Progressing Goal: Ability to manage health-related needs will improve Outcome: Not Progressing   Problem: Metabolic: Goal: Ability to maintain appropriate glucose levels will improve Outcome: Not Progressing   Problem: Education: Goal: Knowledge of condition and prescribed therapy will improve Outcome: Not Progressing   Problem: Physical Regulation: Goal: Complications related to  the disease process, condition or treatment will be avoided or minimized Outcome: Not Progressing

## 2022-08-09 NOTE — Progress Notes (Addendum)
Nutrition Follow-up  INTERVENTION:   -Switch to Vital 1.5 @ 20 ml/hr, advance by 10 ml every 12 hours to goal rate of 60 ml/hr via NGT. -60 ml Prosource TF 20 TID  -At goal: provides 2400 kcals, 157g protein and 1100 ml H2O.  -Switched Multivitamin with minerals to B complex w/ Vitamin C via tube given on CRRT.   NUTRITION DIAGNOSIS:   Increased nutrient needs related to chronic illness (lymphoma) as evidenced by estimated needs.  Ongoing.  GOAL:   Patient will meet greater than or equal to 90% of their needs  Not meeting but progressing with TF  MONITOR:   Vent status, Labs, Weight trends, I & O's, TF   REASON FOR ASSESSMENT:   Consult Enteral/tube feeding initiation and management  ASSESSMENT:   72 y.o. 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 Admitted for sepsis of unknown cause.  6/3 Admit 6/5 Regular diet 6/10 patient vomited, made NPO, NGT placed for decompression 6/13: intubated, started CRRT, RD consult received to start trickle tube feeds at 20 ml/hr.  Visited patient at bedside. Vital HP running at 20 ml/hr. Per RN, tolerating but having some diarrhea overnight.  Messaged CCM about advancing which they agreed to. Will switch to recommendations stated in note 6/13. Will order Vital 1.5 @ 20 ml/hr given increase in calories initially. Will advance every 12 hours towards goal rate of 60 ml/hr. Have added Prosource so pt can meet protein needs.  Patient is currently intubated on ventilator support MV: 9.2 L/min Temp (24hrs), Avg:97.9 F (36.6 C), Min:97.3 F (36.3 C), Max:98.2 F (36.8 C)   Admission weight: 194 lbs Current weight: 190 lbs  Medications: Multivitamin with minerals daily, Precedex  Labs reviewed:  CBGs: 132-158 Low Na Elevated K Elevated  Phos Low Mg   Diet Order:   Diet Order             Diet NPO time specified  Diet effective now                   EDUCATION NEEDS:   Education needs have been addressed  Skin:  Skin Assessment: Reviewed RN Assessment  Last BM:  6/13  Height:   Ht Readings from Last 1 Encounters:  08/01/22 5\' 7"  (1.702 m)    Weight:   Wt Readings from Last 1 Encounters:  08/09/22 86.3 kg    BMI:  Body mass index is 29.8 kg/m.  Estimated Nutritional Needs:   Kcal:  2200-2600  Protein:  150-175g  Fluid:  2.2L or per MD  Tilda Franco, MS, RD, LDN Inpatient Clinical Dietitian Contact information available via Amion

## 2022-08-09 NOTE — TOC Progression Note (Signed)
Transition of Care [facility]) - Progression Note    Patient Details  Name: Tyler [name] MRN: [id number] Date of Birth: 13-Mar-1950  Transition of Care [facility]) CM/SW Contact  Beckie Busing, RN Phone [phone]  08/09/2022, 9:42 AM  Clinical Narrative:    TOC continues to follow for disposition needs. Currently on vent. Not ready for disposition planning.    Expected Discharge Plan: Home/Self Care Barriers to Discharge: Continued Medical Work up  Expected Discharge Plan and Services                                               Social Determinants of Health (SDOH) Interventions SDOH Screenings   Food Insecurity: No Food Insecurity (07/30/2022)  Housing: Low Risk  (07/30/2022)  Transportation Needs: No Transportation Needs (07/30/2022)  Utilities: Not At Risk (07/30/2022)  Tobacco Use: Low Risk  (08/08/2022)    Readmission Risk Interventions    07/30/2022    2:44 PM  Readmission Risk Prevention Plan  Transportation Screening Complete  PCP or Specialist Appt within 3-5 Days Complete  HRI or Home Care Consult Complete  Social Work Consult for Recovery Care Planning/Counseling Complete  Palliative Care Screening Not Applicable  Medication Review [employer]) Complete

## 2022-08-10 DIAGNOSIS — N179 Acute kidney failure, unspecified: Secondary | ICD-10-CM | POA: Diagnosis not present

## 2022-08-10 DIAGNOSIS — J690 Pneumonitis due to inhalation of food and vomit: Secondary | ICD-10-CM | POA: Diagnosis not present

## 2022-08-10 DIAGNOSIS — J9601 Acute respiratory failure with hypoxia: Secondary | ICD-10-CM | POA: Diagnosis not present

## 2022-08-10 DIAGNOSIS — E883 Tumor lysis syndrome: Secondary | ICD-10-CM | POA: Diagnosis not present

## 2022-08-10 LAB — HEMOGLOBIN A1C
Hgb A1c MFr Bld: 4.8 % (ref 4.8–5.6)
Mean Plasma Glucose: 91.06 mg/dL

## 2022-08-10 LAB — GLUCOSE, CAPILLARY
Glucose-Capillary: 166 mg/dL — ABNORMAL HIGH (ref 70–99)
Glucose-Capillary: 152 mg/dL — ABNORMAL HIGH (ref 70–99)
Glucose-Capillary: 198 mg/dL — ABNORMAL HIGH (ref 70–99)
Glucose-Capillary: 192 mg/dL — ABNORMAL HIGH (ref 70–99)
Glucose-Capillary: 178 mg/dL — ABNORMAL HIGH (ref 70–99)
Glucose-Capillary: 172 mg/dL — ABNORMAL HIGH (ref 70–99)

## 2022-08-10 LAB — BPAM RBC
Blood Product Expiration Date: 202407152359
Unit Type and Rh: 5100

## 2022-08-10 LAB — RENAL FUNCTION PANEL
Albumin: 1.7 g/dL — ABNORMAL LOW (ref 3.5–5.0)
Albumin: 1.5 g/dL — ABNORMAL LOW (ref 3.5–5.0)
Anion gap: 12 (ref 5–15)
Anion gap: 12 (ref 5–15)
BUN: 62 mg/dL — ABNORMAL HIGH (ref 8–23)
BUN: 50 mg/dL — ABNORMAL HIGH (ref 8–23)
CO2: 23 mmol/L (ref 22–32)
CO2: 24 mmol/L (ref 22–32)
Calcium: 7 mg/dL — ABNORMAL LOW (ref 8.9–10.3)
Calcium: 7.4 mg/dL — ABNORMAL LOW (ref 8.9–10.3)
Chloride: 100 mmol/L (ref 98–111)
Chloride: 99 mmol/L (ref 98–111)
Creatinine, Ser: 2.07 mg/dL — ABNORMAL HIGH (ref 0.61–1.24)
Creatinine, Ser: 1.7 mg/dL — ABNORMAL HIGH (ref 0.61–1.24)
GFR, Estimated: 34 mL/min — ABNORMAL LOW (ref >60)
GFR, Estimated: 43 mL/min — ABNORMAL LOW (ref >60)
Glucose, Bld: 175 mg/dL — ABNORMAL HIGH (ref 70–99)
Glucose, Bld: 229 mg/dL — ABNORMAL HIGH (ref 70–99)
Phosphorus: 5.1 mg/dL — ABNORMAL HIGH (ref 2.5–4.6)
Phosphorus: 3.6 mg/dL (ref 2.5–4.6)
Potassium: 4.2 mmol/L (ref 3.5–5.1)
Potassium: 3.8 mmol/L (ref 3.5–5.1)
Sodium: 135 mmol/L (ref 135–145)
Sodium: 135 mmol/L (ref 135–145)

## 2022-08-10 LAB — CBC WITH DIFFERENTIAL/PLATELET
Abs Immature Granulocytes: 1.69 10*3/uL — ABNORMAL HIGH (ref 0.00–0.07)
Basophils Absolute: 0.1 10*3/uL (ref 0.0–0.1)
Basophils Relative: 1 %
Eosinophils Absolute: 0 10*3/uL (ref 0.0–0.5)
Eosinophils Relative: 0 %
HCT: 23 % — ABNORMAL LOW (ref 39.0–52.0)
Hemoglobin: 7.2 g/dL — ABNORMAL LOW (ref 13.0–17.0)
Immature Granulocytes: 17 %
Lymphocytes Relative: 6 %
Lymphs Abs: 0.6 10*3/uL — ABNORMAL LOW (ref 0.7–4.0)
MCH: 27.2 pg (ref 26.0–34.0)
MCHC: 31.3 g/dL (ref 30.0–36.0)
MCV: 86.8 fL (ref 80.0–100.0)
Monocytes Absolute: 0.1 10*3/uL (ref 0.1–1.0)
Monocytes Relative: 1 %
Neutro Abs: 7.5 10*3/uL (ref 1.7–7.7)
Neutrophils Relative %: 75 %
Platelets: 25 10*3/uL — CL (ref 150–400)
RBC: 2.65 MIL/uL — ABNORMAL LOW (ref 4.22–5.81)
RDW: 18.1 % — ABNORMAL HIGH (ref 11.5–15.5)
WBC: 10 10*3/uL (ref 4.0–10.5)
nRBC: 0 % (ref 0.0–0.2)

## 2022-08-10 LAB — CBC
HCT: 20.4 % — ABNORMAL LOW (ref 39.0–52.0)
Hemoglobin: 6.5 g/dL — CL (ref 13.0–17.0)
MCH: 27.2 pg (ref 26.0–34.0)
MCHC: 31.9 g/dL (ref 30.0–36.0)
MCV: 85.4 fL (ref 80.0–100.0)
Platelets: 24 10*3/uL — CL (ref 150–400)
RBC: 2.39 MIL/uL — ABNORMAL LOW (ref 4.22–5.81)
RDW: 18 % — ABNORMAL HIGH (ref 11.5–15.5)
WBC: 7.8 10*3/uL (ref 4.0–10.5)
nRBC: 0 % (ref 0.0–0.2)

## 2022-08-10 LAB — CULTURE, RESPIRATORY W GRAM STAIN
Culture: NORMAL
Gram Stain: NONE SEEN

## 2022-08-10 LAB — HEPATIC FUNCTION PANEL
ALT: 27 U/L (ref 0–44)
AST: 41 U/L (ref 15–41)
Albumin: 1.7 g/dL — ABNORMAL LOW (ref 3.5–5.0)
Alkaline Phosphatase: 77 U/L (ref 38–126)
Bilirubin, Direct: 3 mg/dL — ABNORMAL HIGH (ref 0.0–0.2)
Indirect Bilirubin: 2.3 mg/dL — ABNORMAL HIGH (ref 0.3–0.9)
Total Bilirubin: 5.3 mg/dL — ABNORMAL HIGH (ref 0.3–1.2)
Total Protein: 4.4 g/dL — ABNORMAL LOW (ref 6.5–8.1)

## 2022-08-10 LAB — MAGNESIUM: Magnesium: 2.7 mg/dL — ABNORMAL HIGH (ref 1.7–2.4)

## 2022-08-10 LAB — TYPE AND SCREEN: ABO/RH(D): O POS

## 2022-08-10 LAB — OCCULT BLOOD X 1 CARD TO LAB, STOOL: Fecal Occult Bld: POSITIVE — AB

## 2022-08-10 LAB — URIC ACID: Uric Acid, Serum: 2.9 mg/dL — ABNORMAL LOW (ref 3.7–8.6)

## 2022-08-10 LAB — PREPARE RBC (CROSSMATCH)

## 2022-08-10 MED ORDER — SODIUM CHLORIDE 0.9% IV SOLUTION: Freq: Once | INTRAVENOUS | Status: AC

## 2022-08-10 NOTE — Progress Notes (Signed)
[facility] Progress Note  Subjective: pt seen in ICU. Net neg 2.3 L yesterday. Levo drip down a bit at 4-5 mcg/min.  Remains oliguric/ anuric. Uric acid down at 2.9 today.   Vitals:   08/10/22 0750 08/10/22 0800 08/10/22 0815 08/10/22 0825  BP:  (!) 116/57 108/61   Pulse:  84 81   Resp:  18 18   Temp: 98.5 F (36.9 C)     TempSrc: Oral     SpO2: 98% 98% 99% 98%  Weight:      Height:        Exam: on vent ,sedated  no jvd  throat ett in place  Chest cta bilat and lat  Cor reg no RG  Abd soft ntnd no ascites   Ext 1+ doughy edema x 4 ext, improved   Neuro on vent and sedated    Home meds include - allopurinol, lisniopril 5 qd, reglan, MVI, elavil, gabapentin, prednisone, prns/ vits/ supps       UA turbid, 100 prot, few bact, 6-10 rbc/ 21-50 wbc, 0-5 epis    UNa < 10, UCr 130      Renal US - 10.4/ 10.8 cm kidneys w/o hydro, normal echo    CXR 6/10 - clear lungs, bilat LAN     Assessment/ Plan: AKI - b/l creatinine 0.6- 0.9 here on admission. Creat rose up to 1.3 on 6/10 and 2.3 today 6/11.  UA showed ^wbc, min rbc's/ protein. Renal US showed no obstruction. Pt did had IV contrast exposure and some hypotension, but primarily this AKI was likely due to tumor lysis syndrome. Uric acid/ hyperkalemia were high and now rx'd w/ CRRT and rasburicase have improved. Pt remains anuric. high which could indicate tumor lysis syndrome. CRRT started 6/13. Continue CRRT.  TLS - got rasburicase 6mg  on 6/12 and another 6mg  on 6/13. UA down to 2.9. Per ONC.  Hypotension - levo down to 4-5 mcg / min  Follicular B cell lymphoma - got some chemoRx on 6/12, on hold now for AKI Volume - CXR w/o edema, L effusion layering. Wt's down w/ CRRT UF. Edema improved.  Anemia/ pancytopenia - per pmd/ oncology Parox atrial fib      Rob Arlean Hopping MD [facility] 08/10/2022, 10:16 AM  Recent Labs  Lab 08/09/22 0520 08/09/22 1519 08/09/22 1707 08/09/22 2212 08/10/22 0329 08/10/22 0334  HGB  8.4*  --   --   --  7.2*  --   ALBUMIN  --    < > 1.9*  --  1.7* 1.7*  CALCIUM  --    < > 7.0* 7.2*  --  7.0*  PHOS  --    < > 6.3*  --   --  5.1*  CREATININE  --    < > 2.18* 2.27*  --  2.07*  K  --    < > 3.6 4.2  --  4.2   < > = values in this interval not displayed.    No results for input(s): "IRON", "TIBC", "FERRITIN" in the last 168 hours. Inpatient medications:  acetaminophen  650 mg Per Tube Once   allopurinol  100 mg Per Tube BID   amiodarone  200 mg Per Tube Daily   B-complex with vitamin C  1 tablet Per Tube Daily   Chlorhexidine Gluconate Cloth  6 each Topical Q2200   docusate  100 mg Per Tube BID   feeding supplement (PROSource TF20)  60 mL Per Tube TID   insulin aspart  0-6 Units Subcutaneous Q4H   mouth rinse  15 mL Mouth Rinse Q2H   pantoprazole (PROTONIX) IV  40 mg Intravenous Daily   polyethylene glycol  17 g Per Tube Daily   senna  2 tablet Per NG tube Daily   sodium chloride flush  10-40 mL Intracatheter Q12H   Tbo-filgastrim (GRANIX) SQ  480 mcg Subcutaneous q1800    sodium chloride 10 mL/hr at 08/10/22 1000   dexmedetomidine (PRECEDEX) IV infusion 0.6 mcg/kg/hr (08/10/22 1000)   feeding supplement (VITAL 1.5 CAL) 30 mL/hr at 08/10/22 1000   fentaNYL infusion INTRAVENOUS 50 mcg/hr (08/10/22 1000)   norepinephrine (LEVOPHED) Adult infusion 5 mcg/min (08/10/22 1000)   piperacillin-tazobactam Stopped (08/10/22 0956)   prismasol BGK 0/2.5 400 mL/hr at 08/10/22 0055   prismasol BGK 0/2.5 400 mL/hr at 08/10/22 0058   prismasol BGK 4/2.5 1,500 mL/hr at 08/10/22 0801   sodium bicarbonate 150 mEq in sterile water 1,150 mL infusion 50 mL/hr at 08/10/22 1000   sodium chloride, chlorproMAZINE, fentaNYL (SUBLIMAZE) injection, heparin sodium (porcine), ipratropium-albuterol, lip balm, midazolam, ondansetron **OR** ondansetron (ZOFRAN) IV, mouth rinse, sodium chloride flush

## 2022-08-10 NOTE — Progress Notes (Signed)
NAME:  Tyler [name], MRN:  [id number], DOB:  [birth year], LOS: 12 ADMISSION DATE:  07/29/2022 CONSULTATION DATE:  08/08/2022 REFERRING MD:  Hanley Ben - TRH CHIEF COMPLAINT: Sepsis, ARF with need for RRT   History of Present Illness:  72 yo male with low grade myelodysplastic syndrome found to have generalized lymphadenopathy from high grade large B cell lymphoma. Plan to start on R-CHOP. Came to ER with near syncope. He had fever and hypotension with elevated lactic acid level. Started on ABx. He had seizure like activity, and Rt facial paresthesia. He developed renal failure. Seen by cardiology for PAF. Had PICC placed and started on R mini CHOP on 08/05/22. Renal fx got worse and nephrology consulted. Had elevated uric acid with concern for tumor lysis syndrome and received rasburicase. Rituxan held because of this. Renal fx and mental status got worse. Concern for aspiration pneumonitis. Nephrology recommending to start CRRT. PCCM consulted.   Pertinent Medical History:  Prostate cancer, MDS, B cell lymphoma, PE, HTN, PAF, Recurrent UTI  Significant Hospital Events: Including procedures, antibiotic start and stop dates in addition to other pertinent events   6/03 Presented to ED for fever, near-syncope. Presumed sepsis. Admitted to [facility]. 6/13 Worsening acute renal failure as evidenced by rising BUN/Cr, hyperkalemia to 5.6. PCCM consulted for ARF/need for RRT, line placement. Intubated. R IJ Trialysis catheter placed. CRRT started.  Interim History / Subjective:  Remains on sedation, vent, CRRT, levophed.  Objective:  Blood pressure (!) 82/42, pulse 76, temperature 98.6 F (37 C), temperature source Axillary, resp. rate 18, height 5\' 7"  (1.702 m), weight 83.6 kg, SpO2 98 %.    Vent Mode: PRVC FiO2 (%):  [30 %-40 %] 30 % Set Rate:  [18 bmp] 18 bmp Vt Set:  [530 mL] 530 mL PEEP:  [5 cmH20] 5 cmH20 Plateau Pressure:  [16 cmH20-20 cmH20] 20 cmH20   Intake/Output Summary (Last 24 hours) at  08/10/2022 0804 Last data filed at 08/10/2022 0800 Gross per 24 hour  Intake 4063.17 ml  Output 5467 ml  Net -1403.83 ml   Filed Weights   08/09/22 0500 08/09/22 2149 08/10/22 0500  Weight: 86.3 kg 84.5 kg 83.6 kg   Physical Examination:  General - sedated Eyes - pupils reactive ENT - ETT in place Cardiac - regular rate/rhythm, no murmur Chest - equal breath sounds b/l, no wheezing or rales Abdomen - soft, non tender, + bowel sounds Extremities - decreased edema Skin - no rashes Neuro - RASS -2  Resolved Hospital Problem List:    Assessment & Plan:   Acute hypoxic respiratory failure with compromised airway and aspiration pneumonitis. - pressure support as tolerated - not ready for extubation trial at this time due to hemodynamics and hypervolemia - day 3 of zosyn - follow up sputum culture   AKI from tumor lysis syndrome and IV contrast. Hyperkalemia. Non anion gap metabolic acidosis. - continue renal replacement therapy - follow up BMET - nephrology consulted >> defer bicarbonate infusion to nephrology   High grade B cell lymphoma. Chemotherapy induced pancytopenia. Tumor lysis syndrome. - oncology following - follow up CBC - continue allopurinol - rituxan on hold for now   Paroxysmal atrial fibrillation (CHA2DS2-VASc Score 4, not on AC due to history of bleeding/pelvic hematoma). Hx of PE. - monitor on telemetry - continue amiodarone   Acute metabolic encephalopathy 2nd to uremia. - RASS goal 0 to -1   Elevated bilirubin. - follow up LFTs  Steroid induced hyperglycemia. - HbA1c 4.8 from 08/09/22 -  SSI  Best Practice: (right click and "Reselect all SmartList Selections" daily)   Diet/type: tubefeeds DVT prophylaxis: SCDs GI prophylaxis: PPI Lines: Central line and Dialysis Catheter Foley:  Yes, and it is still needed Code Status:  full code Last date of multidisciplinary goals of care discussion [6/13 - Discussed with patient's daughter via  phone, verbally consented for Trialysis/CRRT initiation and intubation; she states that she spoke with her father prior to his AMS and he "wanted everything".]  Labs:      Latest Ref Rng & Units 08/10/2022    3:34 AM 08/10/2022    3:29 AM 08/09/2022   10:12 PM  CMP  Glucose 70 - 99 mg/dL 161   096   BUN 8 - 23 mg/dL 62   66   Creatinine 0.45 - 1.24 mg/dL 4.09   8.11   Sodium 914 - 145 mmol/L 135   135   Potassium 3.5 - 5.1 mmol/L 4.2   4.2   Chloride 98 - 111 mmol/L 100   98   CO2 22 - 32 mmol/L 23   24   Calcium 8.9 - 10.3 mg/dL 7.0   7.2   Total Protein 6.5 - 8.1 g/dL  4.4    Total Bilirubin 0.3 - 1.2 mg/dL  5.3    Alkaline Phos 38 - 126 U/L  77    AST 15 - 41 U/L  41    ALT 0 - 44 U/L  27         Latest Ref Rng & Units 08/10/2022    3:29 AM 08/09/2022    5:20 AM 08/08/2022    3:55 AM  CBC  WBC 4.0 - 10.5 K/uL 10.0  10.7  6.4   Hemoglobin 13.0 - 17.0 g/dL 7.2  8.4  7.3   Hematocrit 39.0 - 52.0 % 23.0  26.7  23.2   Platelets 150 - 400 K/uL 25  40  42     ABG    Component Value Date/Time   PHART 7.34 (L) 08/08/2022 1507   PCO2ART 42 08/08/2022 1507   PO2ART 240 (H) 08/08/2022 1507   HCO3 22.7 08/08/2022 1507   ACIDBASEDEF 2.9 (H) 08/08/2022 1507   O2SAT 100 08/08/2022 1507    CBG (last 3)  Recent Labs    08/09/22 2004 08/09/22 2304 08/10/22 0257  GLUCAP 167* 168* 166*    Critical care time: 37 minutes  Coralyn Helling, MD [facility] Pager - [phone] or [phone] 08/10/2022, 8:04 AM

## 2022-08-11 ENCOUNTER — Inpatient Hospital Stay (HOSPITAL_COMMUNITY): Payer: 59

## 2022-08-11 DIAGNOSIS — J9601 Acute respiratory failure with hypoxia: Secondary | ICD-10-CM | POA: Diagnosis not present

## 2022-08-11 DIAGNOSIS — E883 Tumor lysis syndrome: Secondary | ICD-10-CM | POA: Diagnosis not present

## 2022-08-11 DIAGNOSIS — J690 Pneumonitis due to inhalation of food and vomit: Secondary | ICD-10-CM | POA: Diagnosis not present

## 2022-08-11 LAB — RENAL FUNCTION PANEL
Albumin: 1.7 g/dL — ABNORMAL LOW (ref 3.5–5.0)
Albumin: 1.8 g/dL — ABNORMAL LOW (ref 3.5–5.0)
Anion gap: 10 (ref 5–15)
Anion gap: 8 (ref 5–15)
BUN: 52 mg/dL — ABNORMAL HIGH (ref 8–23)
BUN: 52 mg/dL — ABNORMAL HIGH (ref 8–23)
CO2: 25 mmol/L (ref 22–32)
CO2: 26 mmol/L (ref 22–32)
Calcium: 7.4 mg/dL — ABNORMAL LOW (ref 8.9–10.3)
Calcium: 7.5 mg/dL — ABNORMAL LOW (ref 8.9–10.3)
Chloride: 99 mmol/L (ref 98–111)
Chloride: 102 mmol/L (ref 98–111)
Creatinine, Ser: 1.58 mg/dL — ABNORMAL HIGH (ref 0.61–1.24)
Creatinine, Ser: 1.53 mg/dL — ABNORMAL HIGH (ref 0.61–1.24)
GFR, Estimated: 47 mL/min — ABNORMAL LOW (ref >60)
GFR, Estimated: 49 mL/min — ABNORMAL LOW (ref >60)
Glucose, Bld: 185 mg/dL — ABNORMAL HIGH (ref 70–99)
Glucose, Bld: 200 mg/dL — ABNORMAL HIGH (ref 70–99)
Phosphorus: 3.7 mg/dL (ref 2.5–4.6)
Phosphorus: 3.6 mg/dL (ref 2.5–4.6)
Potassium: 3.7 mmol/L (ref 3.5–5.1)
Potassium: 3.4 mmol/L — ABNORMAL LOW (ref 3.5–5.1)
Sodium: 134 mmol/L — ABNORMAL LOW (ref 135–145)
Sodium: 136 mmol/L (ref 135–145)

## 2022-08-11 LAB — CBC WITH DIFFERENTIAL/PLATELET
Abs Immature Granulocytes: 1.93 10*3/uL — ABNORMAL HIGH (ref 0.00–0.07)
Basophils Absolute: 0.1 10*3/uL (ref 0.0–0.1)
Basophils Relative: 1 %
Eosinophils Absolute: 0 10*3/uL (ref 0.0–0.5)
Eosinophils Relative: 0 %
HCT: 24.2 % — ABNORMAL LOW (ref 39.0–52.0)
Hemoglobin: 7.9 g/dL — ABNORMAL LOW (ref 13.0–17.0)
Immature Granulocytes: 28 %
Lymphocytes Relative: 7 %
Lymphs Abs: 0.5 10*3/uL — ABNORMAL LOW (ref 0.7–4.0)
MCH: 27.7 pg (ref 26.0–34.0)
MCHC: 32.6 g/dL (ref 30.0–36.0)
MCV: 84.9 fL (ref 80.0–100.0)
Monocytes Absolute: 0.1 10*3/uL (ref 0.1–1.0)
Monocytes Relative: 1 %
Neutro Abs: 4.3 10*3/uL (ref 1.7–7.7)
Neutrophils Relative %: 63 %
Platelets: 20 10*3/uL — CL (ref 150–400)
RBC: 2.85 MIL/uL — ABNORMAL LOW (ref 4.22–5.81)
RDW: 17.6 % — ABNORMAL HIGH (ref 11.5–15.5)
WBC: 7 10*3/uL (ref 4.0–10.5)
nRBC: 0 % (ref 0.0–0.2)

## 2022-08-11 LAB — GLUCOSE, CAPILLARY
Glucose-Capillary: 166 mg/dL — ABNORMAL HIGH (ref 70–99)
Glucose-Capillary: 175 mg/dL — ABNORMAL HIGH (ref 70–99)
Glucose-Capillary: 147 mg/dL — ABNORMAL HIGH (ref 70–99)
Glucose-Capillary: 171 mg/dL — ABNORMAL HIGH (ref 70–99)
Glucose-Capillary: 131 mg/dL — ABNORMAL HIGH (ref 70–99)
Glucose-Capillary: 120 mg/dL — ABNORMAL HIGH (ref 70–99)

## 2022-08-11 LAB — HEPATIC FUNCTION PANEL
ALT: 31 U/L (ref 0–44)
AST: 33 U/L (ref 15–41)
Albumin: 1.8 g/dL — ABNORMAL LOW (ref 3.5–5.0)
Alkaline Phosphatase: 84 U/L (ref 38–126)
Bilirubin, Direct: 1.7 mg/dL — ABNORMAL HIGH (ref 0.0–0.2)
Indirect Bilirubin: 1.5 mg/dL — ABNORMAL HIGH (ref 0.3–0.9)
Total Bilirubin: 3.2 mg/dL — ABNORMAL HIGH (ref 0.3–1.2)
Total Protein: 4.6 g/dL — ABNORMAL LOW (ref 6.5–8.1)

## 2022-08-11 LAB — HEMOGLOBIN AND HEMATOCRIT, BLOOD
HCT: 29.7 % — ABNORMAL LOW (ref 39.0–52.0)
Hemoglobin: 9.3 g/dL — ABNORMAL LOW (ref 13.0–17.0)

## 2022-08-11 LAB — URIC ACID: Uric Acid, Serum: 2.3 mg/dL — ABNORMAL LOW (ref 3.7–8.6)

## 2022-08-11 LAB — MAGNESIUM: Magnesium: 2.5 mg/dL — ABNORMAL HIGH (ref 1.7–2.4)

## 2022-08-11 NOTE — Procedures (Signed)
Extubation Procedure Note  Patient Details:   Name: Tyler [name] DOB: 1950/11/27 MRN: [id number]   Airway Documentation:    Vent end date: 08/11/22 Vent end time: 0909   Evaluation  O2 sats: stable throughout Complications: No apparent complications Patient did tolerate procedure well. Bilateral Breath Sounds: Clear, Diminished   Pt able to speak. Placed on 4L [place].  Renold Genta 08/11/2022, 9:11 AM

## 2022-08-11 NOTE — Progress Notes (Signed)
NAME:  Tyler [name], MRN:  [id number], DOB:  December 07, 1950, LOS: 13 ADMISSION DATE:  07/29/2022 CONSULTATION DATE:  08/08/2022 REFERRING MD:  Hanley Ben - [facility] CHIEF COMPLAINT: Sepsis, ARF with need for RRT   History of Present Illness:  72 yo male with low grade myelodysplastic syndrome found to have generalized lymphadenopathy from high grade large B cell lymphoma. Plan to start on R-CHOP. Came to ER with near syncope. He had fever and hypotension with elevated lactic acid level. Started on ABx. He had seizure like activity, and Rt facial paresthesia. He developed renal failure. Seen by cardiology for PAF. Had PICC placed and started on R mini CHOP on 08/05/22. Renal fx got worse and nephrology consulted. Had elevated uric acid with concern for tumor lysis syndrome and received rasburicase. Rituxan held because of this. Renal fx and mental status got worse. Concern for aspiration pneumonitis. Nephrology recommending to start CRRT. PCCM consulted.   Pertinent Medical History:  Prostate cancer, MDS, B cell lymphoma, PE, HTN, PAF, Recurrent UTI  Significant Hospital Events: Including procedures, antibiotic start and stop dates in addition to other pertinent events   6/03 Presented to ED for fever, near-syncope. Presumed sepsis. Admitted to [facility]. 6/13 Worsening acute renal failure as evidenced by rising BUN/Cr, hyperkalemia to 5.6. PCCM consulted for ARF/need for RRT, line placement. Intubated. R IJ Trialysis catheter placed. CRRT started. 6/15 transfuse 1 unit PRBC 6/16 extubate  Interim History / Subjective:  Started on pressure support this morning.  Remains on sedation, CRRT, pressors.  Objective:  Blood pressure 130/69, pulse 93, temperature (P) 98.1 F (36.7 C), temperature source (P) Oral, resp. rate 19, height 5\' 7"  (1.702 m), weight 78.3 kg, SpO2 95 %. CVP:  [10 mmHg] 10 mmHg  Vent Mode: PSV FiO2 (%):  [30 %] 30 % Set Rate:  [18 bmp] 18 bmp Vt Set:  [530 mL] 530 mL PEEP:  [5 cmH20] 5  cmH20 Pressure Support:  [8 cmH20] 8 cmH20 Plateau Pressure:  [9 cmH20-16 cmH20] 15 cmH20   Intake/Output Summary (Last 24 hours) at 08/11/2022 0843 Last data filed at 08/11/2022 0800 Gross per 24 hour  Intake 4365.11 ml  Output 6028 ml  Net -1662.89 ml   Filed Weights   08/09/22 2149 08/10/22 0500 08/11/22 0420  Weight: 84.5 kg 83.6 kg 78.3 kg   Physical Examination:  General - alert Eyes - pupils reactive ENT - ETT in place Cardiac - regular rate/rhythm, no murmur Chest - equal breath sounds b/l, no wheezing or rales Abdomen - soft, non tender, + bowel sounds Extremities - edema improved Skin - no rashes Neuro - moves extremities, follows commands simple commands   Resolved Hospital Problem List:    Assessment & Plan:   Acute hypoxic respiratory failure with compromised airway and aspiration pneumonitis. - proceed with extubation 6/16 - Abx day 4 of 5, continue zosyn - oxygen to keep SpO2 > 92%   AKI from tumor lysis syndrome and IV contrast. Hyperkalemia. Non anion gap metabolic acidosis. - continue renal replacement therapy - follow up BMET - nephrology consulted   High grade B cell lymphoma. Chemotherapy induced pancytopenia. Tumor lysis syndrome. - oncology following - follow up CBC - transfuse for Hb < 7, PLT < 20K, or significant bleeding - continue allopurinol - rituxan on hold for now   Paroxysmal atrial fibrillation (CHA2DS2-VASc Score 4, not on AC due to history of bleeding/pelvic hematoma). Hx of PE. - monitor on telemetry - continue amiodarone   Acute metabolic encephalopathy 2nd  to uremia. - much improved on 6/16   Elevated bilirubin. - follow up LFTs intermittently  Steroid induced hyperglycemia. - HbA1c 4.8 from 08/09/22 - SSI  D/w Dr. Arlean Hopping.  Best Practice: (right click and "Reselect all SmartList Selections" daily)   Diet/type: tubefeeds DVT prophylaxis: SCDs GI prophylaxis: PPI Lines: Central line and Dialysis  Catheter Foley:  Yes, and it is still needed Code Status:  full code Last date of multidisciplinary goals of care discussion [updated his daughter, Karena Addison, by phone on 6/16]  Labs:      Latest Ref Rng & Units 08/11/2022    3:16 AM 08/10/2022    4:16 PM 08/10/2022    3:34 AM  CMP  Glucose 70 - 99 mg/dL 213  086  578   BUN 8 - 23 mg/dL 52  50  62   Creatinine 0.61 - 1.24 mg/dL 4.69  6.29  5.28   Sodium 135 - 145 mmol/L 134  135  135   Potassium 3.5 - 5.1 mmol/L 3.7  3.8  4.2   Chloride 98 - 111 mmol/L 99  99  100   CO2 22 - 32 mmol/L 25  24  23    Calcium 8.9 - 10.3 mg/dL 7.4  7.4  7.0   Total Protein 6.5 - 8.1 g/dL 4.6     Total Bilirubin 0.3 - 1.2 mg/dL 3.2     Alkaline Phos 38 - 126 U/L 84     AST 15 - 41 U/L 33     ALT 0 - 44 U/L 31          Latest Ref Rng & Units 08/11/2022    3:16 AM 08/10/2022   11:37 PM 08/10/2022    4:16 PM  CBC  WBC 4.0 - 10.5 K/uL 7.0   7.8   Hemoglobin 13.0 - 17.0 g/dL 7.9  9.3  6.5   Hematocrit 39.0 - 52.0 % 24.2  29.7  20.4   Platelets 150 - 400 K/uL 20   24     ABG    Component Value Date/Time   PHART 7.34 (L) 08/08/2022 1507   PCO2ART 42 08/08/2022 1507   PO2ART 240 (H) 08/08/2022 1507   HCO3 22.7 08/08/2022 1507   ACIDBASEDEF 2.9 (H) 08/08/2022 1507   O2SAT 100 08/08/2022 1507    CBG (last 3)  Recent Labs    08/10/22 2255 08/11/22 0309 08/11/22 0741  GLUCAP 172* 166* 175*    Critical care time: 34 minutes  Coralyn Helling, MD [facility] Pager - [phone] or [phone] 08/11/2022, 8:43 AM

## 2022-08-11 NOTE — Progress Notes (Signed)
eLink Physician-Brief Progress Note Patient Name: Tyler [name] DOB: July 28, 1950 MRN: [id number]   Date of Service  08/11/2022  HPI/Events of Note  Platelets at 20 K, down from 24 anemia at 7.9, down from 9.3  eICU Interventions  Continue care.      Intervention Category Intermediate Interventions: Other:  Ranee Gosselin 08/11/2022, 4:57 AM

## 2022-08-11 NOTE — Progress Notes (Signed)
[facility] Progress Note  Subjective: pt seen in ICU. Net neg 1.4 L yesterday. Remains oliguric/ anuric. Uric acid stable at 2.3. Tbili 5 > 3 today.   Vitals:   08/11/22 0752 08/11/22 0757 08/11/22 0800 08/11/22 0815  BP:   125/66 130/69  Pulse:   89 93  Resp:   (!) 24 19  Temp:  (P) 98.1 F (36.7 C)    TempSrc:  (P) Oral    SpO2: 96%  98% 95%  Weight:      Height:        Exam: on vent ,sedated  no jvd  throat ett in place  Chest cta bilat and lat  Cor reg no RG  Abd soft ntnd no ascites   Ext 1+ doughy edema x 4 ext, improved   Neuro on vent and sedated    Home meds include - allopurinol, lisniopril 5 qd, reglan, MVI, elavil, gabapentin, prednisone, prns/ vits/ supps       UA turbid, 100 prot, few bact, 6-10 rbc/ 21-50 wbc, 0-5 epis    UNa < 10, UCr 130      Renal US - 10.4/ 10.8 cm kidneys w/o hydro, normal echo    CXR 6/10 - clear lungs, bilat LAN    CXR 6/14 - layering effusion on L, o/w no edema     Assessment/ Plan: AKI - b/l creatinine 0.6- 0.9 here on admission. Creat increased up to 1.3 on 6/10 and 2.3 on 6/11.  UA showed ^wbc, min rbc's/ protein. Renal US showed no obstruction. 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 Uric acid/ hyperkalemia peaked and now have recovered w/ CRRT and rasburicase. CRRT started 6/13. No signs of renal recovery, cont CRRT.  Tumor lysis syndrome - sp rasburicase 6mg  on 6/12 and another 6mg  on 6/13. UA down to 2- 3 range. Per ONC.  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 If pt needs more chemoRx soon, would consider keeping pt on CRRT during the next round to help prevent build-up of metabolites.   Volume - CXR w/o edema, L effusion layering. Wt's down and edema improved w/ UF.  CVP 8 which is euvolemia. Will stop negative UF --> keep + 50 cc/hr to cover insensible losses.   Parox atrial fib      Rob Arlean Hopping MD [facility] 08/11/2022, 8:44 AM  Recent Labs  Lab 08/10/22 1616 08/10/22 2337 08/11/22 0316  HGB 6.5* 9.3* 7.9*  ALBUMIN 1.5*  --  1.8*  1.7*  CALCIUM 7.4*  --  7.4*  PHOS 3.6  --  3.7  CREATININE 1.70*  --  1.58*  K 3.8  --  3.7    No results for input(s): "IRON", "TIBC", "FERRITIN" in the last 168 hours. Inpatient medications:  acetaminophen  650 mg Per Tube Once   allopurinol  100 mg Per Tube BID   amiodarone  200 mg Per Tube Daily   B-complex with vitamin C  1 tablet Per Tube Daily   Chlorhexidine Gluconate Cloth  6 each Topical Q2200   docusate  100 mg Per Tube BID   feeding supplement (PROSource TF20)  60 mL Per Tube TID   insulin aspart  0-6 Units Subcutaneous Q4H   mouth rinse  15 mL Mouth Rinse Q2H   pantoprazole (PROTONIX) IV  40 mg Intravenous Daily  polyethylene glycol  17 g Per Tube Daily   senna  2 tablet Per NG tube Daily   sodium chloride flush  10-40 mL Intracatheter Q12H   Tbo-filgastrim (GRANIX) SQ  480 mcg Subcutaneous q1800    sodium chloride 10 mL/hr at 08/11/22 0800   dexmedetomidine (PRECEDEX) IV infusion 0.5 mcg/kg/hr (08/11/22 0800)   feeding supplement (VITAL 1.5 CAL) 50 mL/hr at 08/11/22 0800   fentaNYL infusion INTRAVENOUS 50 mcg/hr (08/11/22 0800)   norepinephrine (LEVOPHED) Adult infusion 2 mcg/min (08/11/22 0800)   piperacillin-tazobactam Stopped (08/11/22 0351)   prismasol BGK 0/2.5 400 mL/hr at 08/11/22 0248   prismasol BGK 0/2.5 400 mL/hr at 08/11/22 0246   prismasol BGK 4/2.5 1,500 mL/hr at 08/11/22 0759   sodium bicarbonate 150 mEq in sterile water 1,150 mL infusion 50 mL/hr at 08/11/22 0800   sodium chloride, chlorproMAZINE, fentaNYL (SUBLIMAZE) injection, heparin sodium (porcine), ipratropium-albuterol, lip balm, midazolam, ondansetron **OR** ondansetron (ZOFRAN) IV, mouth rinse, sodium chloride flush

## 2022-08-12 DIAGNOSIS — C8338 Diffuse large B-cell lymphoma, lymph nodes of multiple sites: Secondary | ICD-10-CM | POA: Diagnosis not present

## 2022-08-12 DIAGNOSIS — A419 Sepsis, unspecified organism: Secondary | ICD-10-CM | POA: Diagnosis not present

## 2022-08-12 DIAGNOSIS — D61810 Antineoplastic chemotherapy induced pancytopenia: Secondary | ICD-10-CM | POA: Diagnosis not present

## 2022-08-12 DIAGNOSIS — T451X5A Adverse effect of antineoplastic and immunosuppressive drugs, initial encounter: Secondary | ICD-10-CM

## 2022-08-12 DIAGNOSIS — R6520 Severe sepsis without septic shock: Secondary | ICD-10-CM | POA: Diagnosis not present

## 2022-08-12 DIAGNOSIS — D701 Agranulocytosis secondary to cancer chemotherapy: Secondary | ICD-10-CM

## 2022-08-12 DIAGNOSIS — E883 Tumor lysis syndrome: Secondary | ICD-10-CM | POA: Diagnosis not present

## 2022-08-12 LAB — CBC WITH DIFFERENTIAL/PLATELET
Abs Immature Granulocytes: 0 10*3/uL (ref 0.00–0.07)
Basophils Absolute: 0 10*3/uL (ref 0.0–0.1)
Basophils Relative: 1 %
Eosinophils Absolute: 0.1 10*3/uL (ref 0.0–0.5)
Eosinophils Relative: 4 %
HCT: 20.1 % — ABNORMAL LOW (ref 39.0–52.0)
Hemoglobin: 6.2 g/dL — CL (ref 13.0–17.0)
Immature Granulocytes: 0 %
Lymphocytes Relative: 31 %
Lymphs Abs: 0.5 10*3/uL — ABNORMAL LOW (ref 0.7–4.0)
MCH: 27.7 pg (ref 26.0–34.0)
MCHC: 30.8 g/dL (ref 30.0–36.0)
MCV: 89.7 fL (ref 80.0–100.0)
Monocytes Absolute: 0 10*3/uL — ABNORMAL LOW (ref 0.1–1.0)
Monocytes Relative: 3 %
Neutro Abs: 0.9 10*3/uL — ABNORMAL LOW (ref 1.7–7.7)
Neutrophils Relative %: 61 %
Platelets: 9 10*3/uL — CL (ref 150–400)
RBC: 2.24 MIL/uL — ABNORMAL LOW (ref 4.22–5.81)
RDW: 17.6 % — ABNORMAL HIGH (ref 11.5–15.5)
WBC: 1.5 10*3/uL — ABNORMAL LOW (ref 4.0–10.5)
nRBC: 0 % (ref 0.0–0.2)

## 2022-08-12 LAB — BPAM RBC: ISSUE DATE / TIME: 202406151728

## 2022-08-12 LAB — HEPATIC FUNCTION PANEL
ALT: 31 U/L (ref 0–44)
AST: 31 U/L (ref 15–41)
Albumin: 1.7 g/dL — ABNORMAL LOW (ref 3.5–5.0)
Alkaline Phosphatase: 79 U/L (ref 38–126)
Bilirubin, Direct: 1.3 mg/dL — ABNORMAL HIGH (ref 0.0–0.2)
Indirect Bilirubin: 1.2 mg/dL — ABNORMAL HIGH (ref 0.3–0.9)
Total Bilirubin: 2.5 mg/dL — ABNORMAL HIGH (ref 0.3–1.2)
Total Protein: 4.2 g/dL — ABNORMAL LOW (ref 6.5–8.1)

## 2022-08-12 LAB — RENAL FUNCTION PANEL
Albumin: 1.6 g/dL — ABNORMAL LOW (ref 3.5–5.0)
Albumin: <1.5 g/dL — ABNORMAL LOW (ref 3.5–5.0)
Anion gap: 8 (ref 5–15)
Anion gap: 11 (ref 5–15)
BUN: 41 mg/dL — ABNORMAL HIGH (ref 8–23)
BUN: 33 mg/dL — ABNORMAL HIGH (ref 8–23)
CO2: 28 mmol/L (ref 22–32)
CO2: 23 mmol/L (ref 22–32)
Calcium: 7.5 mg/dL — ABNORMAL LOW (ref 8.9–10.3)
Calcium: 7.4 mg/dL — ABNORMAL LOW (ref 8.9–10.3)
Chloride: 101 mmol/L (ref 98–111)
Chloride: 102 mmol/L (ref 98–111)
Creatinine, Ser: 1.44 mg/dL — ABNORMAL HIGH (ref 0.61–1.24)
Creatinine, Ser: 1.46 mg/dL — ABNORMAL HIGH (ref 0.61–1.24)
GFR, Estimated: 52 mL/min — ABNORMAL LOW (ref >60)
GFR, Estimated: 51 mL/min — ABNORMAL LOW (ref >60)
Glucose, Bld: 114 mg/dL — ABNORMAL HIGH (ref 70–99)
Glucose, Bld: 106 mg/dL — ABNORMAL HIGH (ref 70–99)
Phosphorus: 3.1 mg/dL (ref 2.5–4.6)
Phosphorus: 2.9 mg/dL (ref 2.5–4.6)
Potassium: 3.3 mmol/L — ABNORMAL LOW (ref 3.5–5.1)
Potassium: 3.7 mmol/L (ref 3.5–5.1)
Sodium: 137 mmol/L (ref 135–145)
Sodium: 136 mmol/L (ref 135–145)

## 2022-08-12 LAB — PREPARE PLATELET PHERESIS

## 2022-08-12 LAB — MAGNESIUM: Magnesium: 2.3 mg/dL (ref 1.7–2.4)

## 2022-08-12 LAB — TYPE AND SCREEN
Antibody Screen: NEGATIVE
Unit division: 0
Unit division: 0

## 2022-08-12 LAB — BPAM PLATELET PHERESIS
ISSUE DATE / TIME: 202406171834
ISSUE DATE / TIME: 202406172350

## 2022-08-12 LAB — URIC ACID: Uric Acid, Serum: <1.5 mg/dL — ABNORMAL LOW (ref 3.7–8.6)

## 2022-08-12 LAB — GLUCOSE, CAPILLARY
Glucose-Capillary: 119 mg/dL — ABNORMAL HIGH (ref 70–99)
Glucose-Capillary: 116 mg/dL — ABNORMAL HIGH (ref 70–99)
Glucose-Capillary: 116 mg/dL — ABNORMAL HIGH (ref 70–99)
Glucose-Capillary: 98 mg/dL (ref 70–99)
Glucose-Capillary: 134 mg/dL — ABNORMAL HIGH (ref 70–99)
Glucose-Capillary: 168 mg/dL — ABNORMAL HIGH (ref 70–99)

## 2022-08-12 LAB — PREPARE RBC (CROSSMATCH)

## 2022-08-12 MED ORDER — HEPARIN SODIUM (PORCINE) 1000 UNIT/ML DIALYSIS: 1000.0000 [IU] | INTRAMUSCULAR | Status: DC | PRN

## 2022-08-12 MED ORDER — SODIUM CHLORIDE 0.9 % IV SOLN: 250.0000 mL | INTRAVENOUS | Status: DC

## 2022-08-12 MED ORDER — PHENYLEPHRINE HCL-NACL 20-0.9 MG/250ML-% IV SOLN
0.0000 ug/min | INTRAVENOUS | Status: DC
  Administered 2022-08-13: 20 ug/min via INTRAVENOUS
  Filled 2022-08-12: qty 250

## 2022-08-12 MED ORDER — MELATONIN 5 MG PO TABS
5.0000 mg | ORAL_TABLET | Freq: Every evening | ORAL | Status: DC | PRN
  Administered 2022-08-12 – 2022-08-21 (×10): 5 mg
  Filled 2022-08-12 (×10): qty 1

## 2022-08-12 MED ORDER — PHENYLEPHRINE HCL-NACL 20-0.9 MG/250ML-% IV SOLN: 25.0000 ug/min | INTRAVENOUS | Status: DC

## 2022-08-12 MED ORDER — PRISMASOL BGK 4/2.5 32-4-2.5 MEQ/L REPLACEMENT SOLN: Status: DC

## 2022-08-12 MED ORDER — OXYCODONE HCL 5 MG PO TABS
5.0000 mg | ORAL_TABLET | ORAL | Status: DC | PRN
  Administered 2022-08-12 – 2022-08-16 (×10): 5 mg
  Filled 2022-08-12 (×11): qty 1

## 2022-08-12 MED ORDER — TRAZODONE HCL 50 MG PO TABS
50.0000 mg | ORAL_TABLET | Freq: Every evening | ORAL | Status: DC | PRN
  Administered 2022-08-12 – 2022-08-21 (×9): 50 mg
  Filled 2022-08-12 (×9): qty 1

## 2022-08-12 MED ORDER — SODIUM CHLORIDE 0.9% IV SOLUTION: Freq: Once | INTRAVENOUS | Status: AC

## 2022-08-12 NOTE — Progress Notes (Signed)
Nutrition Follow-up  DOCUMENTATION CODES:   Not applicable  INTERVENTION:  - Per CCM, can restart tube feeds today.  Vital 1.5 at 60 ml/h (1440 ml per day) *Start at 50mL/hr and advance by 10mL Q8H Prosource TF20 60 ml TID Provides 2400 kcal, 157 gm protein, 1100 ml free water daily  - Monitor magnesium, potassium, and phosphorus, replete as needed.  - FWF per CCM/MD.   - Continue B complex w/ Vitamin C.   - Monitor weight trends.   NUTRITION DIAGNOSIS:   Increased nutrient needs related to chronic illness (lymphoma) as evidenced by estimated needs. *ongoing  GOAL:   Patient will meet greater than or equal to 90% of their needs *unmet  MONITOR:   Vent status, Labs, Weight trends, I & O's  REASON FOR ASSESSMENT:   Consult Enteral/tube feeding initiation and management  ASSESSMENT:   72 y.o. 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 Admitted for sepsis of unknown cause.  6/3 Admit 6/5 Regular diet 6/10 patient vomited, made NPO, NGT placed for decompression 6/13: intubated, started CRRT, RD consult received to start trickle tube feeds of Vital HP at 20 ml/hr. 6/14 switched to Vital 1.5 6/16 Extubated  Patient working with another provider at time of visit.  Remains on CRRT at this time. Per Nephrology note, plan to continue CRRT for now.   Tube feeds stopped prior to extubation and remain off today. NGT remains in place, to LIS this AM.   Patient discussed with CCM. Per MD, can restart tube feeds today. Will plan for restarting back at 88mL/hr and advancing by 10mL Q8H for better tolerance and as patient has had inadequate oral intake since admit. Discussed with RN as well.   Admission weight: 194 lbs Current weight: 175 lbs Patient +8.3L and experiencing  generalized edema which is likely affecting weight status. Will monitor trends.  Medications reviewed and include: Colace, Miralax, Senokot, B-complex with vitamin C  Labs reviewed:  K+ 3.3 Creatinine 1.44   Diet Order:   Diet Order             Diet NPO time specified  Diet effective now                   EDUCATION NEEDS:  Education needs have been addressed  Skin:  Skin Assessment: Reviewed RN Assessment  Last BM:  6/17 - rectal tube  Height:  Ht Readings from Last 1 Encounters:  08/01/22 5\' 7"  (1.702 m)   Weight:  Wt Readings from Last 1 Encounters:  08/12/22 79.7 kg    BMI:  Body mass index is 27.52 kg/m.  Estimated Nutritional Needs:  Kcal:  2200-2600 Protein:  150-175g Fluid:  2.2L or per MD    Shelle Iron RD, LDN For contact information, refer to [facility].

## 2022-08-12 NOTE — Progress Notes (Signed)
[facility] Progress Note   Assessment/ Plan:   AKI - b/l creatinine 0.6- 0.9 here on admission. Creat increased up to 1.3 on 6/10 and 2.3 on 6/11.  UA showed ^wbc, min rbc's/ protein. Renal US showed no obstruction. 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 Uric acid/ hyperkalemia peaked and now have recovered w/ CRRT and rasburicase. CRRT started 6/13. No signs of renal recovery, cont CRRT.  Will await oncology input- more chemo or no?  Before deciding whether or not to pause.  D/w family.    Tumor lysis syndrome - sp rasburicase 6mg  on 6/12 and another 6mg  on 6/13. UA down to 2- 3 range. Per ONC.  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 If pt needs more chemoRx soon, would consider keeping pt on CRRT during the next round to help prevent build-up of metabolites.   Volume - CXR w/o edema, L effusion layering. Wt's down and edema improved w/ UF.  CVP 8 which is euvolemia. Keep even Parox atrial fib  Subjective:    Seen in room.  Family at bedside.  Tolerating CRRT well.  Extubated   Objective:   BP (!) 120/59   Pulse 90   Temp 97.8 F (36.6 C) (Axillary)   Resp (!) 22   Ht 5\' 7"  (1.702 m)   Wt 79.7 kg   SpO2 91%   BMI 27.52 kg/m   Physical Exam: Gen:NAD, lying in bed CVS: RRR Resp: clear Abd: soft Ext: trace LE edema ACCESS: R internal jugular nontunneled HD catheter  Labs: BMET Recent Labs  Lab 08/09/22 1519 08/09/22 1707 08/09/22 2212 08/10/22 0334 08/10/22 1616 08/11/22 0316 08/11/22 1544 08/12/22 0515  NA 133* 134* 135 135 135 134* 136 137  K 3.8 3.6 4.2 4.2 3.8 3.7 3.4* 3.3*  CL 97* 98 98 100 99 99 102 101  CO2 25 23 24 23 24 25 26 28   GLUCOSE 206* 205* 170* 175* 229* 185* 200* 114*  BUN 60* 61* 66* 62* 50* 52* 52* 41*  CREATININE 2.29* 2.18* 2.27* 2.07* 1.70* 1.58* 1.53* 1.44*  CALCIUM 6.9* 7.0* 7.2* 7.0* 7.4*  7.4* 7.5* 7.5*  PHOS 6.5* 6.3*  --  5.1* 3.6 3.7 3.6 3.1   CBC Recent Labs  Lab 08/08/22 0355 08/09/22 0520 08/10/22 0329 08/10/22 1616 08/10/22 2337 08/11/22 0316  WBC 6.4 10.7* 10.0 7.8  --  7.0  NEUTROABS 5.5 9.0* 7.5  --   --  4.3  HGB 7.3* 8.4* 7.2* 6.5* 9.3* 7.9*  HCT 23.2* 26.7* 23.0* 20.4* 29.7* 24.2*  MCV 87.9 86.4 86.8 85.4  --  84.9  PLT 42* 40* 25* 24*  --  20*      Medications:     acetaminophen  650 mg Per Tube Once   allopurinol  100 mg Per Tube BID   amiodarone  200 mg Per Tube Daily   B-complex with vitamin C  1 tablet Per Tube Daily   Chlorhexidine Gluconate Cloth  6 each Topical Q2200   docusate  100 mg Per Tube BID   feeding supplement (PROSource TF20)  60 mL Per Tube TID   insulin aspart  0-6 Units Subcutaneous Q4H   pantoprazole (PROTONIX) IV  40 mg Intravenous Daily   polyethylene glycol  17 g Per Tube Daily   senna  2 tablet Per NG tube Daily   sodium  chloride flush  10-40 mL Intracatheter Q12H   Tbo-filgastrim (GRANIX) SQ  480 mcg Subcutaneous q1800     Bufford Buttner, MD 08/12/2022, 12:51 PM

## 2022-08-12 NOTE — Progress Notes (Signed)
NAME:  Tyler [name], MRN:  [id number], DOB:  01/08/1951, LOS: 14 ADMISSION DATE:  07/29/2022 CONSULTATION DATE:  08/08/2022 REFERRING MD:  Hanley Ben - TR[facility] CHIEF COMPLAINT: Sepsis, ARF with need for RRT   History of Present Illness:  72 yo male with low grade myelodysplastic syndrome found to have generalized lymphadenopathy from high grade large B cell lymphoma. Plan to start on R-CHOP. Came to ER with near syncope. He had fever and hypotension with elevated lactic acid level. Started on ABx. He had seizure like activity, and Rt facial paresthesia. He developed renal failure. Seen by cardiology for PAF. Had PICC placed and started on R mini CHOP on 08/05/22. Renal fx got worse and nephrology consulted. Had elevated uric acid with concern for tumor lysis syndrome and received rasburicase. Rituxan held because of this. Renal fx and mental status got worse. Concern for aspiration pneumonitis. Nephrology recommending to start CRRT. PCCM consulted.   Pertinent Medical History:  Prostate cancer, MDS, B cell lymphoma, PE, HTN, PAF, Recurrent UTI  Significant Hospital Events: Including procedures, antibiotic start and stop dates in addition to other pertinent events   6/03 Presented to ED for fever, near-syncope. Presumed sepsis. Admitted to [facility]. 6/13 Worsening acute renal failure as evidenced by rising BUN/Cr, hyperkalemia to 5.6. PCCM consulted for ARF/need for RRT, line placement. Intubated. R IJ Trialysis catheter placed. CRRT started. 6/15 transfuse 1 unit PRBC 6/16 extubate 6/17 no acute events overnight, remains on CRRT  Interim History / Subjective:  Remains on CRRT, family at bedside Flat affect and minimally verbal  Objective:  Blood pressure 116/61, pulse 88, temperature (!) 97.5 F (36.4 C), temperature source Oral, resp. rate (!) 24, height 5\' 7"  (1.702 m), weight 79.7 kg, SpO2 90 %. CVP:  [2 mmHg-9 mmHg] 7 mmHg      Intake/Output Summary (Last 24 hours) at 08/12/2022 1117 Last data filed  at 08/12/2022 1100 Gross per 24 hour  Intake 2171.59 ml  Output 1030.1 ml  Net 1141.49 ml    Filed Weights   08/10/22 0500 08/11/22 0420 08/12/22 0500  Weight: 83.6 kg 78.3 kg 79.7 kg   Physical Examination: General: Acute on chronic ill-appearing elderly male lying in bed in no acute distress HEENT: [place]/AT, MM pink/moist, PERRL,  Neuro: Alert and interactive, does not engage very much with interviewer.  He understands [place] but primarily speaks Swahili CV: s1s2 regular rate and rhythm, no murmur, rubs, or gallops,  PULM: Diminished bilaterally, no increased work of breathing, on RA GI: soft, bowel sounds active in all 4 quadrants, non-tender, non-distended, tolerating tube feeds Extremities: warm/dry, no edema  Skin: no rashes or lesions  Resolved Hospital Problem List:  Acute hypoxic respiratory failure with compromised airway  -Extubated 6/16 Hyperkalemia  Assessment & Plan:   Aspiration pneumonitis P: Continue 5 days IV Zosyn Ensure adequate pulmonary hygiene Aspiration precautions Continue supplemental oxygen for SpO2 goal greater than 92  AKI from tumor lysis syndrome and IV contrast Non anion gap metabolic acidosis P: Nephrology following, appreciate assistance CRRT per nephrology Follow renal function Monitor urine output Trend Bmet Avoid nephrotoxins Ensure adequate renal perfusion   High grade B cell lymphoma Chemotherapy induced pancytopenia Tumor lysis syndrome -sp rasburicase 6mg  on 6/12 and another 6mg  on 6/13.  P: Oncology following, appreciate assistance Trend CBC Transfuse per protocol Hemoglobin goal greater than 7, platelet goal greater than 20 Continue allopurinol Rituxan remains on hold  Paroxysmal atrial fibrillation  -CHA2DS2-VASc Score 4, not on AC due to history of bleeding/pelvic  hematoma Hx of PE P: Continuous telemetry Continue oral amiodarone   Acute metabolic encephalopathy 2nd to uremia P: Maintain neuro protective  measures Delirium precautions Nutrition and bowel regiment  Aspirations precautions    Elevated bilirubin - improving P: Intermittently trend LFTs  Steroid induced hyperglycemia P: Continue SSI CBG goal 140-180 CBG checks q4   Best Practice: (right click and "Reselect all SmartList Selections" daily)   Diet/type: tubefeeds DVT prophylaxis: SCDs GI prophylaxis: PPI Lines: Central line and Dialysis Catheter Foley:  Yes, and it is still needed Code Status:  full code Last date of multidisciplinary goals of care discussion [updated his daughter, Karena Addison, by phone on 6/16]  Critical care time:   CRITICAL CARE Performed by: Aydrian Halpin D. Harris  Total critical care time: 38 minutes  Critical care time was exclusive of separately billable procedures and treating other patients.  Critical care was necessary to treat or prevent imminent or life-threatening deterioration.  Critical care was time spent personally by me on the following activities: development of treatment plan with patient and/or surrogate as well as nursing, discussions with consultants, evaluation of patient's response to treatment, examination of patient, obtaining history from patient or surrogate, ordering and performing treatments and interventions, ordering and review of laboratory studies, ordering and review of radiographic studies, pulse oximetry and re-evaluation of patient's condition.  Nieshia Larmon D. Harris, NP-C [facility] Personal contact information can be found on Amion  If no contact or response made please call [phone] 08/12/2022, 11:25 AM

## 2022-08-12 NOTE — Progress Notes (Signed)
eLink Physician-Brief Progress Note Patient Name: Tyler [name] DOB: [birth year] MRN: [id number]   Date of Service  08/12/2022  HPI/Events of Note  72 year old man with recent diagnosis of large B-cell lymphoma status post starting R-CHOP presented with fever, lethargy, concern for sepsis.  Suddenly developed renal failure concern for tumor lysis syndrome status post rasburicase now on CRRT or dialysis.   Starting to become hypotensive with wide pulse pressures, no evidence of fevers  eICU Interventions  Add on peripheral phenylephrine as needed     Intervention Category Major Interventions: Hypotension - evaluation and management Intermediate Interventions: Hypotension - evaluation and management  Telitha Plath 08/12/2022, 11:43 PM

## 2022-08-12 NOTE — Progress Notes (Signed)
eLink Physician-Brief Progress Note Patient Name: Tyler [name] DOB: [birth year] MRN: [id number]   Date of Service  08/12/2022  HPI/Events of Note  Insomnia  eICU Interventions  Add Melatonin PRN     Intervention Category Minor Interventions: Routine modifications to care plan (e.g. PRN medications for pain, fever)  Averee Harb 08/12/2022, 1:35 AM

## 2022-08-13 DIAGNOSIS — R6520 Severe sepsis without septic shock: Secondary | ICD-10-CM | POA: Diagnosis not present

## 2022-08-13 DIAGNOSIS — A419 Sepsis, unspecified organism: Secondary | ICD-10-CM | POA: Diagnosis not present

## 2022-08-13 DIAGNOSIS — T451X5A Adverse effect of antineoplastic and immunosuppressive drugs, initial encounter: Secondary | ICD-10-CM | POA: Diagnosis present

## 2022-08-13 LAB — CBC WITH DIFFERENTIAL/PLATELET
Abs Immature Granulocytes: 0.06 10*3/uL (ref 0.00–0.07)
Basophils Absolute: 0 10*3/uL (ref 0.0–0.1)
Basophils Relative: 1 %
Eosinophils Absolute: 0.1 10*3/uL (ref 0.0–0.5)
Eosinophils Relative: 8 %
HCT: 24.8 % — ABNORMAL LOW (ref 39.0–52.0)
Hemoglobin: 7.9 g/dL — ABNORMAL LOW (ref 13.0–17.0)
Immature Granulocytes: 8 %
Lymphocytes Relative: 46 %
Lymphs Abs: 0.4 10*3/uL — ABNORMAL LOW (ref 0.7–4.0)
MCH: 27.7 pg (ref 26.0–34.0)
MCHC: 31.9 g/dL (ref 30.0–36.0)
MCV: 87 fL (ref 80.0–100.0)
Monocytes Absolute: 0.1 10*3/uL (ref 0.1–1.0)
Monocytes Relative: 6 %
Neutro Abs: 0.3 10*3/uL — CL (ref 1.7–7.7)
Neutrophils Relative %: 31 %
Platelets: 34 10*3/uL — ABNORMAL LOW (ref 150–400)
RBC: 2.85 MIL/uL — ABNORMAL LOW (ref 4.22–5.81)
RDW: 17 % — ABNORMAL HIGH (ref 11.5–15.5)
WBC: 0.8 10*3/uL — CL (ref 4.0–10.5)
nRBC: 0 % (ref 0.0–0.2)

## 2022-08-13 LAB — PREPARE PLATELET PHERESIS
Unit division: 0
Unit division: 0

## 2022-08-13 LAB — GLUCOSE, CAPILLARY
Glucose-Capillary: 114 mg/dL — ABNORMAL HIGH (ref 70–99)
Glucose-Capillary: 115 mg/dL — ABNORMAL HIGH (ref 70–99)
Glucose-Capillary: 116 mg/dL — ABNORMAL HIGH (ref 70–99)
Glucose-Capillary: 118 mg/dL — ABNORMAL HIGH (ref 70–99)
Glucose-Capillary: 129 mg/dL — ABNORMAL HIGH (ref 70–99)
Glucose-Capillary: 135 mg/dL — ABNORMAL HIGH (ref 70–99)

## 2022-08-13 LAB — BPAM RBC
Blood Product Expiration Date: 202407222359
Blood Product Expiration Date: 202407222359
ISSUE DATE / TIME: 202406172023
ISSUE DATE / TIME: 202406172210
Unit Type and Rh: 5100
Unit Type and Rh: 5100

## 2022-08-13 LAB — TYPE AND SCREEN
ABO/RH(D): O POS
Antibody Screen: NEGATIVE
Unit division: 0

## 2022-08-13 LAB — RENAL FUNCTION PANEL
Albumin: 1.8 g/dL — ABNORMAL LOW (ref 3.5–5.0)
Albumin: 1.8 g/dL — ABNORMAL LOW (ref 3.5–5.0)
Anion gap: 5 (ref 5–15)
Anion gap: 5 (ref 5–15)
BUN: 34 mg/dL — ABNORMAL HIGH (ref 8–23)
BUN: 29 mg/dL — ABNORMAL HIGH (ref 8–23)
CO2: 28 mmol/L (ref 22–32)
CO2: 26 mmol/L (ref 22–32)
Calcium: 7.5 mg/dL — ABNORMAL LOW (ref 8.9–10.3)
Calcium: 7.5 mg/dL — ABNORMAL LOW (ref 8.9–10.3)
Chloride: 104 mmol/L (ref 98–111)
Chloride: 105 mmol/L (ref 98–111)
Creatinine, Ser: 1.36 mg/dL — ABNORMAL HIGH (ref 0.61–1.24)
Creatinine, Ser: 1.39 mg/dL — ABNORMAL HIGH (ref 0.61–1.24)
GFR, Estimated: 56 mL/min — ABNORMAL LOW (ref >60)
GFR, Estimated: 55 mL/min — ABNORMAL LOW (ref >60)
Glucose, Bld: 107 mg/dL — ABNORMAL HIGH (ref 70–99)
Glucose, Bld: 140 mg/dL — ABNORMAL HIGH (ref 70–99)
Phosphorus: 2.9 mg/dL (ref 2.5–4.6)
Phosphorus: 2.1 mg/dL — ABNORMAL LOW (ref 2.5–4.6)
Potassium: 3.7 mmol/L (ref 3.5–5.1)
Potassium: 3.9 mmol/L (ref 3.5–5.1)
Sodium: 137 mmol/L (ref 135–145)
Sodium: 136 mmol/L (ref 135–145)

## 2022-08-13 LAB — BPAM PLATELET PHERESIS
Blood Product Expiration Date: 202406192359
Blood Product Expiration Date: 202406202359
Unit Type and Rh: 5100
Unit Type and Rh: 6200

## 2022-08-13 LAB — MAGNESIUM: Magnesium: 2.5 mg/dL — ABNORMAL HIGH (ref 1.7–2.4)

## 2022-08-13 MED ORDER — NAPHAZOLINE-GLYCERIN 0.012-0.25 % OP SOLN
1.0000 [drp] | Freq: Four times a day (QID) | OPHTHALMIC | Status: AC | PRN
  Administered 2022-08-13 – 2022-08-14 (×3): 1 [drp] via OPHTHALMIC
  Filled 2022-08-13: qty 15

## 2022-08-13 MED ORDER — ACETAMINOPHEN 160 MG/5ML PO SOLN
650.0000 mg | Freq: Once | ORAL | Status: AC
  Administered 2022-08-14: 650 mg
  Filled 2022-08-13: qty 20.3

## 2022-08-13 MED ORDER — SODIUM CHLORIDE 0.9 % IV SOLN: Freq: Once | INTRAVENOUS | Status: AC

## 2022-08-13 MED ORDER — PHENOL 1.4 % MT LIQD
1.0000 | OROMUCOSAL | Status: DC | PRN
  Administered 2022-08-13 – 2022-08-17 (×6): 1 via OROMUCOSAL
  Filled 2022-08-13: qty 177

## 2022-08-13 MED ORDER — ORAL CARE MOUTH RINSE: 15.0000 mL | OROMUCOSAL | Status: DC | PRN

## 2022-08-13 MED ORDER — SODIUM CHLORIDE 0.9 % IV SOLN
375.0000 mg/m2 | Freq: Once | INTRAVENOUS | Status: AC
  Administered 2022-08-14: 800 mg via INTRAVENOUS
  Filled 2022-08-13: qty 50

## 2022-08-13 MED ORDER — DIPHENHYDRAMINE HCL 50 MG/ML IJ SOLN
50.0000 mg | Freq: Once | INTRAMUSCULAR | Status: AC
  Administered 2022-08-14: 50 mg via INTRAVENOUS
  Filled 2022-08-13: qty 1

## 2022-08-13 MED ORDER — ORAL CARE MOUTH RINSE
15.0000 mL | OROMUCOSAL | Status: DC
  Administered 2022-08-14 – 2022-08-27 (×38): 15 mL via OROMUCOSAL

## 2022-08-13 NOTE — Progress Notes (Signed)
[facility] Progress Note   Assessment/ Plan:   AKI - b/l creatinine 0.6- 0.9 here on admission. Creat increased up to 1.3 on 6/10 and 2.3 on 6/11.  UA showed ^wbc, min rbc's/ protein. Renal US showed no obstruction. 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 Uric acid/ hyperkalemia peaked and now have recovered w/ CRRT and rasburicase. CRRT started 6/13. No signs of renal recovery, cont CRRT.    - will remain on CRRT until Ritux given  - all 4K   - net neg 50 mL/ hr Tumor lysis syndrome - sp rasburicase 6mg  on 6/12 and another 6mg  on 6/13. UA down to 2- 3 range. Per ONC.  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 Volume - CXR w/o edema, L effusion layering. Wt's down and edema improved w/ UF.  CVP 8 which is euvolemia. Keep even Parox atrial fib  Subjective:    Seen in room.  D/w PCCM and oncology- plan for Rituxan soon.  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  No family at bedside today.     Objective:   BP 123/68   Pulse 81   Temp 98.3 F (36.8 C) (Oral)   Resp 18   Ht 5\' 7"  (1.702 m)   Wt 79.3 kg   SpO2 100%   BMI 27.38 kg/m   Physical Exam: Gen:NAD, lying in bed CVS: RRR Resp: clear Abd: soft Ext: trace LE edema ACCESS: R internal jugular nontunneled HD catheter  Labs: BMET Recent Labs  Lab 08/10/22 0334 08/10/22 1616 08/11/22 0316 08/11/22 1544 08/12/22 0515 08/12/22 1400 08/13/22 0300  NA 135 135 134* 136 137 136 137  K 4.2 3.8 3.7 3.4* 3.3* 3.7 3.7  CL 100 99 99 102 101 102 104  CO2 23 24 25 26 28 23 28   GLUCOSE 175* 229* 185* 200* 114* 106* 107*  BUN 62* 50* 52* 52* 41* 33* 34*  CREATININE 2.07* 1.70* 1.58* 1.53* 1.44* 1.46* 1.36*  CALCIUM 7.0* 7.4* 7.4* 7.5* 7.5* 7.4* 7.5*  PHOS 5.1* 3.6 3.7 3.6 3.1 2.9 2.9   CBC Recent Labs  Lab 08/10/22 0329 08/10/22 1616  08/10/22 2337 08/11/22 0316 08/12/22 1400 08/13/22 0300  WBC 10.0 7.8  --  7.0 1.5* 0.8*  NEUTROABS 7.5  --   --  4.3 0.9* 0.3*  HGB 7.2* 6.5* 9.3* 7.9* 6.2* 7.9*  HCT 23.0* 20.4* 29.7* 24.2* 20.1* 24.8*  MCV 86.8 85.4  --  84.9 89.7 87.0  PLT 25* 24*  --  20* 9* 34*      Medications:     sodium chloride   Intravenous Once   acetaminophen  650 mg Per Tube Once   [START ON 08/14/2022] acetaminophen (TYLENOL) oral liquid 160 mg/5 mL  650 mg Per Tube Once   allopurinol  100 mg Per Tube BID   amiodarone  200 mg Per Tube Daily   B-complex with vitamin C  1 tablet Per Tube Daily   Chlorhexidine Gluconate Cloth  6 each Topical Q2200   [START ON 08/14/2022] diphenhydrAMINE  50 mg Intravenous Once   docusate  100 mg Per Tube BID   feeding supplement (PROSource TF20)  60 mL Per Tube TID   insulin aspart  0-6 Units Subcutaneous Q4H   pantoprazole (PROTONIX) IV  40 mg Intravenous Daily  polyethylene glycol  17 g Per Tube Daily   [START ON 08/14/2022] riTUXimab-abbs (TRUXIMA) 800 mg in sodium chloride 0.9 % 250 mL (2.4242 mg/mL) infusion  375 mg/m2 (Treatment Plan Recorded) Intravenous Once   senna  2 tablet Per NG tube Daily   sodium chloride flush  10-40 mL Intracatheter Q12H   Tbo-filgastrim (GRANIX) SQ  480 mcg Subcutaneous q1800     Bufford Buttner, MD 08/13/2022, 3:01 PM

## 2022-08-13 NOTE — Progress Notes (Signed)
[name]  HEMATOLOGY/ONCOLOGY INPATIENT PROGRESS NOTE  Date of Service: 08/12/2022  Inpatient Attending: .Hunsucker, Lesia Sago, MD  SUBJECTIVE  Patient was seen in oncologic follow-up.  He has been extubated since my last clinic visit.  Continues to have CRRT.  Is more verbal today.  Receiving NG tube feeding.  On nasal cannula oxygen. Notes no pain or overt shortness of breath. No family at bedside. OBJECTIVE:  NAD  PHYSICAL EXAMINATION: . Vitals:   08/13/22 0730 08/13/22 0745 08/13/22 0800 08/13/22 0815  BP: 123/62 116/70 135/68 126/60  Pulse: 83 82 83 84  Resp: 15 17 20 16   Temp:   97.9 F (36.6 C)   TempSrc:   Oral   SpO2: 99% 97% 100% 100%  Weight:      Height:       Filed Weights   08/11/22 0420 08/12/22 0500 08/13/22 0500  Weight: 172 lb 9.9 oz (78.3 kg) 175 lb 11.3 oz (79.7 kg) 174 lb 13.2 oz (79.3 kg)   .Body mass index is 27.38 kg/m. [name] GENERAL: Awake and alert.  NG tube in situ. LUNGS: clear to auscultation b/l with normal respiratory effort, few scattered rhonchi HEART: regular rate & rhythm ABDOMEN:  normoactive bowel sounds , non tender, not distended. Extremity: 1+ b/l pedal edema   MEDICAL HISTORY:  Past Medical History:  Diagnosis Date   Cancer [facility])    prostate   Follicular lymphoma (HCC) 2024   History of pulmonary embolism    Hypertension    Myelodysplastic syndrome (HCC)    PAF (paroxysmal atrial fibrillation) (HCC)    Recurrent UTI     SURGICAL HISTORY: Past Surgical History:  Procedure Laterality Date   LYMPHADENECTOMY Bilateral 09/01/2019   Procedure: LYMPHADENECTOMY;  Surgeon: Sebastian Ache, MD;  Location: [facility];  Service: Urology;  Laterality: Bilateral;   ROBOT ASSISTED LAPAROSCOPIC RADICAL PROSTATECTOMY N/A 09/01/2019   Procedure: XI ROBOTIC ASSISTED LAPAROSCOPIC RADICAL PROSTATECTOMY;  Surgeon: Sebastian Ache, MD;  Location: [facility];  Service: Urology;  Laterality: N/A;  3 HRS    SOCIAL HISTORY: Social History    Socioeconomic History   Marital status: Married    Spouse name: Not on file   Number of children: Not on file   Years of education: Not on file   Highest education level: Not on file  Occupational History   Not on file  Tobacco Use   Smoking status: Never   Smokeless tobacco: Never  Vaping Use   Vaping Use: Never used  Substance and Sexual Activity   Alcohol use: Yes    Comment: at least one 40 oz beer daily   Drug use: Never   Sexual activity: Not on file  Other Topics Concern   Not on file  Social History Narrative   Not on file   Social Determinants of Health   Financial Resource Strain: Not on file  Food Insecurity: No Food Insecurity (07/30/2022)   Hunger Vital Sign    Worried About Running Out of Food in the Last Year: Never true    Ran Out of Food in the Last Year: Never true  Transportation Needs: No Transportation Needs (07/30/2022)   PRAPARE - Administrator, Civil Service (Medical): No    Lack of Transportation (Non-Medical): No  Physical Activity: Not on file  Stress: Not on file  Social Connections: Not on file  Intimate Partner Violence: Not At Risk (07/30/2022)   Humiliation, Afraid, Rape, and Kick questionnaire    Fear of Current or Ex-Partner:  No    Emotionally Abused: No    Physically Abused: No    Sexually Abused: No    FAMILY HISTORY: Family History  Problem Relation Age of Onset   Stroke Father     ALLERGIES:  is allergic to amitriptyline, amlodipine, and neurontin [gabapentin].  MEDICATIONS:  Scheduled Meds:  sodium chloride   Intravenous Once   acetaminophen  650 mg Per Tube Once   allopurinol  100 mg Per Tube BID   amiodarone  200 mg Per Tube Daily   B-complex with vitamin C  1 tablet Per Tube Daily   Chlorhexidine Gluconate Cloth  6 each Topical Q2200   docusate  100 mg Per Tube BID   feeding supplement (PROSource TF20)  60 mL Per Tube TID   insulin aspart  0-6 Units Subcutaneous Q4H   pantoprazole (PROTONIX) IV  40 mg  Intravenous Daily   polyethylene glycol  17 g Per Tube Daily   senna  2 tablet Per NG tube Daily   sodium chloride flush  10-40 mL Intracatheter Q12H   Tbo-filgastrim (GRANIX) SQ  480 mcg Subcutaneous q1800   Continuous Infusions:   prismasol BGK 4/2.5 400 mL/hr at 08/12/22 0915    prismasol BGK 4/2.5 400 mL/hr at 08/12/22 0913   sodium chloride 10 mL/hr at 08/13/22 1000   sodium chloride Stopped (08/13/22 0021)   feeding supplement (VITAL 1.5 CAL) 40 mL/hr at 08/13/22 1000   phenylephrine (NEO-SYNEPHRINE) Adult infusion Stopped (08/13/22 0630)   prismasol BGK 4/2.5 1,500 mL/hr at 08/13/22 0917   PRN Meds:.sodium chloride, chlorproMAZINE, heparin sodium (porcine), ipratropium-albuterol, lip balm, melatonin, ondansetron **OR** ondansetron (ZOFRAN) IV, mouth rinse, oxyCODONE, phenol, sodium chloride flush, traZODone  REVIEW OF SYSTEMS:    10 Point review of Systems was done is negative except as noted above.   LABORATORY DATA:  I have reviewed the data as listed .CBC    Component Value Date/Time   WBC 0.8 (LL) 08/13/2022 0300   RBC 2.85 (L) 08/13/2022 0300   HGB 7.9 (L) 08/13/2022 0300   HGB 8.1 (L) 07/19/2022 1432   HCT 24.8 (L) 08/13/2022 0300   PLT 34 (L) 08/13/2022 0300   PLT 226 07/19/2022 1432   MCV 87.0 08/13/2022 0300   MCH 27.7 08/13/2022 0300   MCHC 31.9 08/13/2022 0300   RDW 17.0 (H) 08/13/2022 0300   LYMPHSABS 0.4 (L) 08/13/2022 0300   MONOABS 0.1 08/13/2022 0300   EOSABS 0.1 08/13/2022 0300   BASOSABS 0.0 08/13/2022 0300   .    Latest Ref Rng & Units 08/12/2022    2:00 PM 08/11/2022    3:16 AM  CBC  WBC 4.0 - 10.5 K/uL 1.5  7.0   Hemoglobin 13.0 - 17.0 g/dL 6.2  7.9   Hematocrit 09.8 - 52.0 % 20.1  24.2   Platelets 150 - 400 K/uL 9  20    .    Latest Ref Rng & Units 08/12/2022    2:00 PM 08/12/2022    5:15 AM  CMP  Glucose 70 - 99 mg/dL 119  147   BUN 8 - 23 mg/dL 33  41   Creatinine 8.29 - 1.24 mg/dL 5.62  1.30   Sodium 865 - 145 mmol/L 136  137    Potassium 3.5 - 5.1 mmol/L 3.7  3.3   Chloride 98 - 111 mmol/L 102  101   CO2 22 - 32 mmol/L 23  28   Calcium 8.9 - 10.3 mg/dL 7.4  7.5   Total  Protein 6.5 - 8.1 g/dL  4.2   Total Bilirubin 0.3 - 1.2 mg/dL  2.5   Alkaline Phos 38 - 126 U/L  79   AST 15 - 41 U/L  31   ALT 0 - 44 U/L  31      RADIOGRAPHIC STUDIES: I have personally reviewed the radiological images as listed and agreed with the findings in the report. DG CHEST PORT 1 VIEW  Result Date: 08/11/2022 CLINICAL DATA:  [id number] Follow-up exam [id number] EXAM: PORTABLE CHEST 1 VIEW COMPARISON:  August 09, 2022 FINDINGS: The cardiomediastinal silhouette is unchanged and enlarged in contour with widening of the superior paramediastinal stripes.ETT tip terminates 4.8 cm above the carina. Enteric tube tip and side port project over the proximal stomach. RIGHT upper extremity PICC tip terminates over the RIGHT atrium. RIGHT neck CVC tip terminates over the inferior SVC. Layering bilateral pleural effusions. No pneumothorax. Hazy bibasilar opacities most consistent with atelectasis. IMPRESSION: 1. Support apparatus as described above. 2. Layering bilateral pleural effusions with associated atelectasis. Electronically Signed   By: Meda Klinefelter M.D.   On: 08/11/2022 11:20   DG CHEST PORT 1 VIEW  Result Date: 08/09/2022 CLINICAL DATA:  72 year old male with PICC line pulled out slightly. High-grade large B-cell lymphoma. EXAM: PORTABLE CHEST 1 VIEW COMPARISON:  Portable chest 08/08/2022 and earlier. FINDINGS: Portable AP view at 0401 hours. Similar lordotic positioning. Right PICC line tip position at the right mediastinal contour appears stable from yesterday, cavoatrial junction level when allowing for lower lung volumes. Stable right IJ approach central line. Endotracheal tube tip remains at the level the clavicles. Enteric tube courses to the abdomen, tip not included. Stable bulky mediastinal widening seen on CT 08/03/2022 secondary to  malignant appearing lymphadenopathy. Stable cardiac size and mediastinal contours. Ongoing low lung volumes, lower since yesterday. Veiling opacity at the left lung base, probably in part related to layering pleural effusions seen on the CT. Paucity of bowel gas in the upper abdomen. IMPRESSION: 1. Right PICC line tip position appears stable from yesterday, cavoatrial junction level when allowing for lower lung volumes. 2. Other lines and tubes stable. Lower lung volumes, with continued left lung veiling opacity at least in part due to pleural effusion seen recently by CT. 3. Stable bulky mediastinal widening secondary to malignant lymphadenopathy. Electronically Signed   By: Odessa Fleming M.D.   On: 08/09/2022 04:35   DG CHEST PORT 1 VIEW  Result Date: 08/08/2022 CLINICAL DATA:  Status post intubation EXAM: PORTABLE CHEST 1 VIEW COMPARISON:  08/08/2022 FINDINGS: Cardiac shadow is within normal limits. Endotracheal tube, gastric catheter, right PICC and right jugular dialysis catheter are again noted and stable. Persistent widening of the mediastinum is seen consistent with the known adenopathy. Small left effusion is noted. No focal infiltrate is seen. IMPRESSION: Tubes and lines as described above. Overall appearance is stable with widened mediastinum. New small left effusion is seen. Electronically Signed   By: Alcide Clever M.D.   On: 08/08/2022 23:52   DG Chest Port 1 View  Result Date: 08/08/2022 CLINICAL DATA:  Central line placement EXAM: PORTABLE CHEST 1 VIEW COMPARISON:  Chest radiograph 08/05/2022 FINDINGS: The endotracheal tube tip is proximally 3.5 cm from the carina. The right IJ vascular sheath terminates in the mid SVC. A right upper extremity PICC terminates in the region of the cavoatrial junction. The enteric catheter tip courses off the field of view. The side-hole is in the stomach. The heart size is stable. Widening of the  upper mediastinum is unchanged consistent with known lymphadenopathy  seen on prior CT. There is no focal consolidation or pulmonary edema. There is no pleural effusion or pneumothorax There is no acute osseous abnormality. IMPRESSION: 1. Support devices as above in appropriate position. 2. Unchanged widening of the mediastinum consistent with known lymphadenopathy. No new or worsening focal airspace disease. Electronically Signed   By: Lesia Hausen M.D.   On: 08/08/2022 14:23   DG Abd Portable 1V  Result Date: 08/08/2022 CLINICAL DATA:  Ileus EXAM: PORTABLE ABDOMEN - 1 VIEW COMPARISON:  One-view abdomen 08/06/2022 FINDINGS: The bowel gas pattern is now normal. No residual distended bowel is present. Side port of the NG tube is in the stomach. No radio-opaque calculi or other significant radiographic abnormality are seen. IMPRESSION: 1. Resolution of ileus. 2. Side port of the NG tube is in the stomach. Electronically Signed   By: Marin Roberts M.D.   On: 08/08/2022 09:31   DG Abd 1 View  Result Date: 08/06/2022 CLINICAL DATA:  Abdominal distension. EXAM: ABDOMEN - 1 VIEW COMPARISON:  Radiograph yesterday FINDINGS: Tip and side port of the enteric tube below the diaphragm in the stomach. No small bowel distension or evidence of obstruction. There is mild gaseous distension of transverse and descending colon. Small volume of formed stool in the right colon. No radiopaque calculi. No acute osseous abnormalities are seen. IMPRESSION: 1. Tip and side port of the enteric tube below the diaphragm in the stomach. 2. Mild gaseous distension of transverse and descending colon which may represent colonic ileus. No small bowel obstruction. Electronically Signed   By: Narda Rutherford M.D.   On: 08/06/2022 18:14   US RENAL  Result Date: 08/06/2022 CLINICAL DATA:  Acute kidney injury. EXAM: RENAL / URINARY TRACT ULTRASOUND COMPLETE COMPARISON:  CT 07/29/2022 FINDINGS: Right Kidney: Renal measurements: 10 x 5.3 x 6.6 cm = volume: 183 mL. No hydronephrosis. Normal parenchymal  echogenicity. 3.6 cm cyst arises from the lower pole. This needs no further imaging follow-up. No visible stone or solid lesion. Additional cyst on prior CT not well seen. Left Kidney: Renal measurements: 10.8 x 5.6 x 5.3 cm = volume: 167 mL. No hydronephrosis. Normal parenchymal echogenicity. 2.9 cm cyst in the lower pole. Some of the smaller cysts on CT are not seen by ultrasound. No visible stone or solid lesion. Bladder: Not seen, may be empty. Other: None. IMPRESSION: No obstructive uropathy. Electronically Signed   By: Narda Rutherford M.D.   On: 08/06/2022 18:12   DG Abd 1 View  Result Date: 08/05/2022 CLINICAL DATA:  Nasogastric tube placement. EXAM: ABDOMEN - 1 VIEW COMPARISON:  August 05, 2022 FINDINGS: A nasogastric tube is seen with its distal tip overlying the expected region of the gastric antrum. A small amount of radiopaque contrast is seen overlying the expected region of the gastric fundus. The bowel gas pattern is normal. No radio-opaque calculi are seen. IMPRESSION: Nasogastric tube positioning, as described above. Electronically Signed   By: Aram Candela M.D.   On: 08/05/2022 19:05   DG Abd 1 View  Result Date: 08/05/2022 CLINICAL DATA:  [id number] Vomiting [id number] EXAM: ABDOMEN - 1 VIEW COMPARISON:  08/01/2022 FINDINGS: The bowel gas pattern is normal. residual oral contrastIn the gastric fundus. No radio-opaque calculi or other significant radiographic abnormality are seen. IMPRESSION: Negative. Electronically Signed   By: Corlis Leak M.D.   On: 08/05/2022 17:02   DG Chest Port 1V same Day  Result Date: 08/05/2022 CLINICAL DATA:  Dyspnea EXAM: PORTABLE CHEST 1 VIEW COMPARISON:  08/03/2022 FINDINGS: Cardiac shadow is within normal limits. Persistent widening of the superior mediastinum is noted related to lymphadenopathy. Lungs are clear bilaterally. No bony abnormality is noted. IMPRESSION: Persistent lymphadenopathy with mediastinal widening. No other focal abnormality is seen.  Electronically Signed   By: Alcide Clever M.D.   On: 08/05/2022 11:11   [place] EKG SITE RITE  Result Date: 08/05/2022 If Site Rite image not attached, placement could not be confirmed due to current cardiac rhythm.  CT CHEST W CONTRAST  Result Date: 08/03/2022 CLINICAL DATA:  Inpatient. Abnormal chest radiograph. Chest wall mass. * Tracking Code: BO * EXAM: CT CHEST WITH CONTRAST TECHNIQUE: Multidetector CT imaging of the chest was performed during intravenous contrast administration. RADIATION DOSE REDUCTION: This exam was performed according to the departmental dose-optimization program which includes automated exposure control, adjustment of the mA and/or kV according to patient size and/or use of iterative reconstruction technique. CONTRAST:  75mL OMNIPAQUE IOHEXOL 300 MG/ML  SOLN COMPARISON:  Chest radiograph from one day prior. 07/07/2022 chest CT. FINDINGS: Cardiovascular: Normal heart size. No significant pericardial effusion/thickening. Left anterior descending coronary atherosclerosis. Great vessels are normal in course and caliber. No central pulmonary emboli. Mediastinum/Nodes: No significant thyroid nodules. Unremarkable esophagus. Bulky bilateral axillary adenopathy, increased from 07/07/2022. Representative short axis diameter 3.8 cm right axillary node (series 3/image 57), previously 2.7 cm. Bilateral bulky lower neck adenopathy is increased. Representative left lower neck 3.0 cm node (series 3/image 6), previously 1.8 cm. Bulky bilateral mediastinal adenopathy in the prevascular, AP window, right paratracheal and subcarinal chains, increased. Representative 3.0 cm right paratracheal node (series 3/image 53), previously 1.8 cm. Increased bilateral hilar adenopathy. Representative 2.0 cm left hilar node (series 3/image 77), previously 1.2 cm. Lungs/Pleura: No pneumothorax. Small dependent bilateral pleural effusions with associated compressive mild-to-moderate atelectasis in the posterior lower  lobes bilaterally, new. No lung masses or significant pulmonary nodules in the aerated portions of the lungs. Upper abdomen: Multiple splenic masses, for example 3.0 cm medially (series 3/image 131), not previously discretely visualized. Bulky upper abdominal lymphadenopathy in the gastrohepatic ligament, porta hepatis, portacaval, aortocaval and left para-aortic chains, increased. Representative 2.1 cm gastrohepatic ligament node (series 3/image 130), previously 1.8 cm. Representative 2.0 cm left para-aortic node (series 3/image 155), previously 1.3 cm. Interval growth of multiple upper omental soft tissue nodules, largest 3.5 x 2.2 cm (series 3/image 153), previously 2.0 x 1.2 cm. Musculoskeletal: No aggressive appearing focal osseous lesions. Moderate thoracic spondylosis. IMPRESSION: 1. Rapid interval progression of bulky bilateral axillary, bilateral hilar, bilateral mediastinal, bilateral lower neck and upper abdominal lymphadenopathy since recent 07/07/2022 chest CT. Multiple new splenic masses. Enlarging upper omental soft tissue nodules. 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 The axillary adenopathy should be amenable to percutaneous biopsy. 2. Small dependent bilateral pleural effusions with associated compressive mild-to-moderate posterior lower lobe atelectasis, new. 3. One vessel coronary atherosclerosis. Electronically Signed   By: Delbert Phenix M.D.   On: 08/03/2022 12:02   DG Chest Port 1V same Day  Result Date: 08/02/2022 CLINICAL DATA:  Dyspnea shortness of breath EXAM: PORTABLE CHEST 1 VIEW COMPARISON:  Multiple exams, including 07/31/2022 FINDINGS: Substantially widened mediastinum at 14.9 cm presumably due to adenopathy given findings on prior imaging workups. This is stable compared to 07/31/2022 and substantially increased from 05/23/2022, and questionably increased from 07/06/2022  (AP projection on today's exam may be magnify the mediastinum more  than prior PA projections and CT imaging). Heart size within normal limits. Hazy indistinct density at the left lung base possibly from atelectasis or pneumonia, minimally improved from previous. IMPRESSION: 1. Substantially widened mediastinum at 14.9 cm, presumably due to adenopathy given previous chest CT findings. 2. Hazy indistinct density at the left lung base possibly from atelectasis or pneumonia, minimally improved from previous. Electronically Signed   By: Gaylyn Rong M.D.   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 intake, tachycardia and episode of shaking and unresponsiveness then confused thereafter. EEG to evaluate for seizure Level of alertness: Awake AEDs during EEG study: None Technical aspects: This EEG study was done with scalp electrodes positioned according to the 10-20 International system of electrode placement. Electrical activity was reviewed with band pass filter of 1-70Hz , sensitivity of 7 uV/mm, display speed of 11mm/sec with a 60Hz  notched filter applied as appropriate. EEG data were recorded continuously and digitally stored.  Video monitoring was available and reviewed as appropriate. Description: The posterior dominant rhythm consists of 7.5 Hz activity of moderate voltage (25-35 uV) seen predominantly in posterior head regions, symmetric and reactive to eye opening and eye closing. EEG showed intermittent generalized sharply contoured high amplitude 2-3hz  delta slowing. Hyperventilation and photic stimulation were not performed.   ABNORMALITY - Intermittent slow,  generalized IMPRESSION: This study is suggestive of mild diffuse encephalopathy, nonspecific etiology. No seizures or epileptiform discharges were seen throughout the recording.  If suspicion for interictal activity remains a concern, a prolonged study can be considered. Charlsie Quest   DG Abd 1 View  Result Date: 08/01/2022 CLINICAL DATA:  Abdominal distension. EXAM: ABDOMEN - 1 VIEW COMPARISON:  CT AP 07/29/2022 FINDINGS: No dilated loops of large or small bowel identified. Stool noted within the colon. No abnormal abdominal or pelvic calcifications. Visualized osseous structures appear intact. IMPRESSION: Nonobstructive bowel gas pattern. Electronically Signed   By: Signa Kell M.D.   On: 08/01/2022 12:41   DG Chest Port 1 View  Result Date: 07/31/2022 CLINICAL DATA:  Possible aspiration EXAM: PORTABLE CHEST 1 VIEW COMPARISON:  07/29/2022, 07/07/2022 FINDINGS: Cardiac shadow is stable. Widened mediastinum is again identified accentuated by the portable technique. This is consistent with the known mediastinal adenopathy seen on prior CT. Lungs are well aerated bilaterally. Mild bibasilar atelectasis is seen. No sizable effusion is noted. No bony abnormality is seen. IMPRESSION: Widened mediastinum consistent with the known mediastinal adenopathy. Mild bibasilar atelectasis. Electronically Signed   By: Alcide Clever M.D.   On: 07/31/2022 23:06   CT MAXILLOFACIAL WO CONTRAST  Result Date: 07/30/2022 CLINICAL DATA:  Right facial pain EXAM: CT MAXILLOFACIAL WITHOUT CONTRAST TECHNIQUE: Multidetector CT imaging of the maxillofacial structures was performed. Multiplanar CT image reconstructions were also generated. RADIATION DOSE REDUCTION: This exam was performed according to the departmental dose-optimization program which includes automated exposure control, adjustment of the mA and/or kV according to patient size and/or use of iterative reconstruction technique. COMPARISON:  None Available. FINDINGS:  Osseous: No fracture or mandibular dislocation. No destructive process. Large periapical lucency at the root of the most posterior remaining right maxillary 2. Orbits: Negative. No traumatic or inflammatory finding.  Sinuses: Right anterior ethmoid and maxillary sinus complete opacification with polypoid contours. Soft tissues: Multiple enlarged submandibular lymph nodes, measuring up to 1.6 cm. No abscess or fluid collection. Limited intracranial: No significant or unexpected finding. IMPRESSION: 1. Large periapical lucency at the root of the most posterior remaining right maxillary tooth. No associated abscess. 2. Right anterior ethmoid and maxillary sinus complete opacification with polypoid contours, possibly antrochoanal polyp. 3. Multiple enlarged submandibular lymph nodes, measuring up to 1.6 cm, likely reactive. Electronically Signed   By: Deatra Robinson M.D.   On: 07/30/2022 20:38   MR BRAIN W WO CONTRAST  Result Date: 07/30/2022 CLINICAL DATA:  Seizure and headache EXAM: MRI HEAD WITHOUT AND WITH CONTRAST TECHNIQUE: Multiplanar, multiecho pulse sequences of the brain and surrounding structures were obtained without and with intravenous contrast. CONTRAST:  9mL GADAVIST GADOBUTROL 1 MMOL/ML IV SOLN COMPARISON:  None Available. FINDINGS: Brain: No acute infarct, mass effect or extra-axial collection. No acute or chronic hemorrhage. There is multifocal hyperintense T2-weighted signal within the white matter. Parenchymal volume and CSF spaces are normal. The midline structures are normal. There is no abnormal contrast enhancement. Vascular: Major flow voids are preserved. Skull and upper cervical spine: Normal calvarium and skull base. Visualized upper cervical spine and soft tissues are normal. Sinuses/Orbits:Opacification of the right maxillary and right anterior ethmoid sinuses. Normal orbits. IMPRESSION: 1. No acute intracranial abnormality. 2. Multifocal hyperintense T2-weighted signal within the white  matter, most consistent with chronic small vessel ischemia. 3. Polypoid lesion of the right maxillary and ethmoid sinuses, possibly antrochoanal polyp. Nonemergent ENT consultation might be helpful. Electronically Signed   By: Deatra Robinson M.D.   On: 07/30/2022 20:34   CT ABDOMEN PELVIS WO CONTRAST  Result Date: 07/29/2022 CLINICAL DATA:  Acute abdominal pain. EXAM: CT ABDOMEN AND PELVIS WITHOUT CONTRAST TECHNIQUE: Multidetector CT imaging of the abdomen and pelvis was performed following the standard protocol without IV contrast. RADIATION DOSE REDUCTION: This exam was performed according to the departmental dose-optimization program which includes automated exposure control, adjustment of the mA and/or kV according to patient size and/or use of iterative reconstruction technique. COMPARISON:  CT examination dated Jul 15, 2022 FINDINGS: Lower chest: Bibasilar subsegmental atelectasis. Hepatobiliary: No focal liver abnormality is seen. No gallstones, gallbladder wall thickening, or biliary dilatation. Pancreas: Mild generalized pancreatic atrophy. No pancreatic ductal dilatation or surrounding inflammatory changes. Spleen: Normal in size without focal abnormality. Adrenals/Urinary Tract: Adrenal glands are unremarkable. Kidneys are normal, without renal calculi, focal lesion, or hydronephrosis. Bilateral exophytic renal cysts, likely simple cysts. Bladder is unremarkable. Stomach/Bowel: Stomach is within normal limits. Appendix appears normal. Scattered colonic diverticulosis without evidence of acute diverticulitis. No evidence of bowel wall thickening, distention, or inflammatory changes. Vascular/Lymphatic: No significant vascular findings are present. There numerous lymph nodes of multiple sizes which are para-aortic, mesenteric, porta hepatis as well as multiple lymph nodes about the spleen as well as few lymph nodes in the pelvis. These are not significantly changed since prior examination. Reproductive:  Postsurgical changes in the prostate bed. Other: Scattered soft tissue densities in the anterior abdominal wall, which may represent lymph nodes. No abdominopelvic ascites. Musculoskeletal: Compression fracture of the L4 vertebral body is unchanged. No acute osseous abnormality. IMPRESSION: 1. No CT evidence of acute abdominal/pelvic process. 2. Scattered colonic diverticulosis without evidence of acute diverticulitis. 3. Numerous lymph nodes of multiple sizes are present throughout the abdomen and pelvis predominantly in the mesentery and para-aortic region, which are not significantly changed since prior examination. 4.  Chronic compression fracture of the L4 vertebral body. Electronically Signed   By: Larose Hires D.O.   On: 07/29/2022 19:26   CT Head Wo Contrast  Result Date: 07/29/2022 CLINICAL DATA:  Seizures, new onset. EXAM: CT HEAD WITHOUT CONTRAST TECHNIQUE: Contiguous axial images were obtained from the base of the skull through the vertex without intravenous contrast. RADIATION DOSE REDUCTION: This exam was performed according to the departmental dose-optimization program which includes automated exposure control, adjustment of the mA and/or kV according to patient size and/or use of iterative reconstruction technique. COMPARISON:  None Available. FINDINGS: Brain: No evidence of acute infarction, hemorrhage, hydrocephalus, extra-axial collection or mass lesion/mass effect. Vascular: No hyperdense vessel or unexpected calcification. Skull: Normal. Negative for fracture or focal lesion. Sinuses/Orbits: Mucosal thickening of the ethmoid air cells. Other: None. IMPRESSION: 1. No acute intracranial abnormality. 2. Mucosal thickening of the ethmoid air cells. Electronically Signed   By: Larose Hires D.O.   On: 07/29/2022 19:15   ECHOCARDIOGRAM COMPLETE  Result Date: 07/29/2022    ECHOCARDIOGRAM REPORT   Patient Name:   Nauman Heagy Date of Exam: 07/29/2022 Medical Rec #:  [id number]    Height:       67.0 in  Accession #:    [id number]   Weight:       185.9 lb Date of Birth:  05-Apr-1950    BSA:          1.960 m Patient Age:    72 years     BP:           111/73 mmHg Patient Gender: M            HR:           117 bpm. Exam Location:  Outpatient Procedure: 2D Echo, Color Doppler and Cardiac Doppler Indications:    Chemo  History:        Patient has prior history of Echocardiogram examinations, most                 recent 12/16/2021. Risk Factors:Hypertension. Prostate Cancer,                 Lymphoma, Chemotherapy.  Sonographer:    Milbert Coulter Referring Phys: Johney Maine  Sonographer Comments: Image acquisition challenging due to respiratory motion and Tachycardia. IMPRESSIONS  1. Intracavitary gradient is realted to dynamic function. Left ventricular ejection fraction, by estimation, is >75%. The left ventricle has hyperdynamic function. The left ventricle has no regional wall motion abnormalities. There is mild concentric left ventricular hypertrophy. Left ventricular diastolic parameters were normal.  2. Right ventricular systolic function is hyperdynamic. The right ventricular size is normal. Tricuspid regurgitation signal is inadequate for assessing PA pressure.  3. The mitral valve is normal in structure. No evidence of mitral valve regurgitation.  4. The aortic valve is tricuspid. There is mild calcification of the aortic valve. Aortic valve regurgitation is not visualized. No aortic stenosis is present. Comparison(s): No significant change from prior study. FINDINGS  Left Ventricle: Intracavitary gradient is realted to dynamic function. Left ventricular ejection fraction, by estimation, is >75%. The left ventricle has hyperdynamic function. The left ventricle has no regional wall motion abnormalities. The left ventricular internal cavity size was small. There is mild concentric left ventricular hypertrophy. Left ventricular diastolic parameters were normal. Right Ventricle: The right ventricular size is  normal. No increase in right ventricular wall thickness. Right ventricular systolic function is hyperdynamic. Tricuspid regurgitation signal is inadequate for assessing PA pressure. Left  Atrium: Left atrial size was normal in size. Right Atrium: Right atrial size was normal in size. Pericardium: Trivial pericardial effusion is present. The pericardial effusion is posterior to the left ventricle. Mitral Valve: The mitral valve is normal in structure. No evidence of mitral valve regurgitation. Tricuspid Valve: The tricuspid valve is normal in structure. Tricuspid valve regurgitation is not demonstrated. Aortic Valve: The aortic valve is tricuspid. There is mild calcification of the aortic valve. Aortic valve regurgitation is not visualized. No aortic stenosis is present. Aortic valve mean gradient measures 10.0 mmHg. Aortic valve peak gradient measures 18.5 mmHg. Aortic valve area, by VTI measures 2.53 cm. Pulmonic Valve: The pulmonic valve was not well visualized. Pulmonic valve regurgitation is not visualized. Aorta: The aortic root and ascending aorta are structurally normal, with no evidence of dilitation. IAS/Shunts: The interatrial septum was not well visualized.  LEFT VENTRICLE PLAX 2D LVIDd:         4.00 cm     Diastology LVIDs:         1.80 cm     LV e' medial:    8.24 cm/s LV PW:         1.10 cm     LV E/e' medial:  7.5 LV IVS:        1.20 cm     LV e' lateral:   11.70 cm/s LVOT diam:     2.10 cm     LV E/e' lateral: 5.3 LV SV:         72 LV SV Index:   37 LVOT Area:     3.46 cm  LV Volumes (MOD) LV vol d, MOD A2C: 27.2 ml LV vol d, MOD A4C: 49.7 ml LV vol s, MOD A2C: 7.1 ml LV vol s, MOD A4C: 13.6 ml LV SV MOD A2C:     20.1 ml LV SV MOD A4C:     49.7 ml LV SV MOD BP:      26.5 ml RIGHT VENTRICLE TAPSE (M-mode): 2.0 cm LEFT ATRIUM             Index LA diam:        3.50 cm 1.79 cm/m LA Vol (A2C):   26.6 ml 13.57 ml/m LA Vol (A4C):   36.9 ml 18.82 ml/m LA Biplane Vol: 31.3 ml 15.97 ml/m  AORTIC VALVE AV  Area (Vmax):    2.77 cm AV Area (Vmean):   2.66 cm AV Area (VTI):     2.53 cm AV Vmax:           215.00 cm/s AV Vmean:          146.000 cm/s AV VTI:            0.285 m AV Peak Grad:      18.5 mmHg AV Mean Grad:      10.0 mmHg LVOT Vmax:         172.00 cm/s LVOT Vmean:        112.000 cm/s LVOT VTI:          0.208 m LVOT/AV VTI ratio: 0.73  AORTA Ao Asc diam: 2.90 cm MITRAL VALVE MV Area (PHT): 4.49 cm    SHUNTS MV Decel Time: 169 msec    Systemic VTI:  0.21 m MV E velocity: 61.90 cm/s  Systemic Diam: 2.10 cm MV A velocity: 89.20 cm/s MV E/A ratio:  0.69 Riley Lam MD Electronically signed by Riley Lam MD Signature Date/Time: 07/29/2022/4:01:01 PM    Final    DG  Chest Port 1 View  Result Date: 07/29/2022 CLINICAL DATA:  Cough.  Syncope. EXAM: PORTABLE CHEST 1 VIEW COMPARISON:  May 23, 2022.  Jul 07, 2022. FINDINGS: Prominence of the superior mediastinum is noted consistent with adenopathy as noted on prior CT scan. Stable cardiac size. Lungs are clear. Bony thorax is unremarkable. IMPRESSION: Widening of superior mediastinum consistent with adenopathy noted on prior CT scan of Jul 07, 2022. This is concerning for lymphoma or metastatic disease. Electronically Signed   By: Lupita Raider M.D.   On: 07/29/2022 15:34   [place] CORE BIOPSY (LYMPH NODES)  Result Date: 07/16/2022 INDICATION: 72 year old male referred for biopsy of axillary lymph node EXAM: ULTRASOUND-GUIDED BIOPSY AXILLARY LYMPH NODE MEDICATIONS: None. ANESTHESIA/SEDATION: None COMPLICATIONS: None PROCEDURE: Informed written consent was obtained from the patient with interpreter after a thorough discussion of the procedural risks, benefits and alternatives. All questions were addressed. Maximal Sterile Barrier Technique was utilized including caps, mask, sterile gowns, sterile gloves, sterile drape, hand hygiene and skin antiseptic. A timeout was performed prior to the initiation of the procedure. Ultrasound survey was performed  with images stored and sent to PACs. The left axillary region was prepped with chlorhexidine in a sterile fashion, and a sterile drape was applied covering the operative field. A sterile gown and sterile gloves were used for the procedure. Local anesthesia was provided with 1% Lidocaine. Ultrasound guidance was used to infiltrate the region with 1% lidocaine for local anesthesia. Four separate 16 gauge core needle biopsy were then acquired of the left axillary lymph node using ultrasound guidance. Images were stored. Final image was stored after biopsy. Patient tolerated the procedure well and remained hemodynamically stable throughout. No complications were encountered and no significant blood loss was encounter IMPRESSION: Status post ultrasound-guided biopsy of left axillary lymph node Signed, Yvone Neu. Miachel Roux, RPVI [facility] Electronically Signed   By: Gilmer Mor D.O.   On: 07/16/2022 17:07   CT ABDOMEN PELVIS W CONTRAST  Result Date: 07/15/2022 CLINICAL DATA:  Possible lymphoma. Bowel obstruction suspected. Right lower quadrant and right upper quadrant abdominal pain for 6 weeks. Nausea and vomiting. Scheduled for a lymph node biopsy tomorrow. EXAM: CT ABDOMEN AND PELVIS WITH CONTRAST TECHNIQUE: Multidetector CT imaging of the abdomen and pelvis was performed using the standard protocol following bolus administration of intravenous contrast. RADIATION DOSE REDUCTION: This exam was performed according to the departmental dose-optimization program which includes automated exposure control, adjustment of the mA and/or kV according to patient size and/or use of iterative reconstruction technique. CONTRAST:  75mL OMNIPAQUE IOHEXOL 350 MG/ML SOLN COMPARISON:  CT abdomen and pelvis 06/29/2022 FINDINGS: Lower chest: Mild curvilinear subsegmental atelectasis and/or scarring within the bilateral lung bases is similar to prior. Heart size is at  the upper limits of normal. Coronary artery calcifications are noted. Hepatobiliary: Smooth liver contours. There are again punctate subcentimeter low-density lesions within the right hepatic lobe (axial series 3, image 25) and the left hepatic lobe (axial image 23) that are again too small to further characterize but not significantly changed from recent prior. The gallbladder is unremarkable. No intrahepatic or extrahepatic biliary ductal dilatation. Pancreas: Unremarkable. No pancreatic ductal dilatation or surrounding inflammatory changes. Spleen: The spleen measures 11 cm in length, within normal limits. There is a 2.9 x 2.6 x 2.6 cm low-density lesion within the medial aspect of the spleen that is unchanged from 07/07/2022 recent CT but new from 12/10/2021. Adrenals/Urinary Tract: Normal bilateral adrenals. The  kidneys enhance uniformly and are symmetric in size. There are again bilateral fluid attenuation renal cysts, measuring up to 3.8 cm on the right and 2.8 cm on the left. No follow-up imaging is recommended. The urinary bladder is decompressed, limiting evaluation. Stomach/Bowel: Moderate amount of stool throughout the colon. Normal appearance of the terminal ileum. Normal appendix (axial series 3, image 60) no dilated loops of bowel are seen to indicate bowel obstruction. Vascular/Lymphatic: No abdominal aortic aneurysm. The major intra-abdominal aortic branch vessels are patent. No significant change in mesenteric, porta hepatis, retroperitoneal, and pelvic lymphadenopathy compared to 07/07/2022, again new from 12/10/2021. Reference gastrohepatic ligament 2.0 cm short axis lymph node (axial series 2, image 21), lymph node at the anterior aspect of the splenic hilum measuring up to 2.0 cm in short axis (axial image 25), and conglomeration of aortocaval and para-aortic lymph nodes with a focal conglomeration measuring up to 2.8 cm in short axis (axial image 42), not significantly changed from 07/07/2022.  Reproductive: The prostate again appears to be surgically absent. Other: No ventral abdominal hernia is seen. No free air or free fluid is seen within the abdomen or pelvis. Musculoskeletal: Chronic inferior L4 endplate mild concavity degenerative Schmorl's node is similar to prior. Anterior bridging osteophytes are again seen throughout the visualized inferior thoracic spine. There is complete anterior osseous fusion of the left sacroiliac joint. Moderate anterior right sacroiliac joint degenerative osteophytes. IMPRESSION: Compared to 07/07/2022: 1. No bowel obstruction.  Normal appendix. 2. No significant change in mesenteric, porta hepatis, retroperitoneal, and pelvic lymphadenopathy compared to 07/07/2022, again new from 12/10/2021. 3. No significant change in 2.9 cm low-density lesion within the medial aspect of the spleen compared to 07/07/2022 but new from 12/10/2021. 4. Constellation of findings are again concerning for lymphoma versus metastatic disease. Electronically Signed   By: Neita Garnet M.D.   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 Typically would have done daEPOCH-R but with his r/o MDS high risk of cytopenias esp with etoposide Status post mini CHOP on 08/06/2022.  Expect nadir in  blood counts 10 to 12 days out. Rituxan was held due to significant tumor lysis and multifactorial AKI.  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  # 7 Anemia and mild thrombocytopenia -- likely multifactorial -- lymphoma + MDS+ >?sepsis  r/o GI bleeding.  And now due to his mini CHOP chemotherapy.  #8 Abdominal distention with partial obstruction versus ileus.  # 9 Hep B c AB +ve, Hep B SAg neg HepB SAb neg, HBV DNA PCR --neg.  #10 acute kidney injury with sodium level less than 20 possibly related to prerenal causes versus contrast nephropathy versus high risk of tumor lysis syndrome. Renal ultrasound with no obstruction Received 1 dose of rasburicase on 6/12 and 1 dose of rasburicase 6 mg on 6/13. Currently on CRRT.  11 neutropenia from chemotherapy-continues to be on G-CSF PLAN -Labs done today were reviewed in detail -Patient is nearing his blood counts which should be at their  lowest between day 10 and day 12 post chemotherapy and will hopefully start recovering. -Continue Granix 480 mcg daily till ANC is more than 1000 on 2 successive days -Transfuse PRBC as needed to maintain hemoglobin more than 7.5 -Transfuse platelets as needed to maintain platelets more than 20k -Optimize nutritional status per NG feeding -Appreciate excellent care by pulm and critical care medicine team -Appreciate nephrology input and help with CRRT. -Continue allopurinol for TLS prophylaxis through the first cycle. -Will plan to pursue Rituxan in the next 24 to 48 hours. -Ongoing goals of care discussion based on recovery from cycle 1 of treatment.  The treatment is expected to be quite effective in shrinking his lymphoma but the intensity of treatment might be limited based on his other medical issues, blood counts, renal function, nutritional status etc.  The total time spent in the appointment was 35 minutes*.  All of the patient's questions were answered with apparent satisfaction. The patient knows to call the clinic with any problems, questions or concerns.   Wyvonnia Lora MD MS AAHIVMS [facility] Hematology/Oncology Physician [facility]  .*Total Encounter Time as defined by the Centers for Medicare and Medicaid  Services includes, in addition to the face-to-face time of a patient visit (documented in the note above) non-face-to-face time: obtaining and reviewing outside history, ordering and reviewing medications, tests or procedures, care coordination (communications with other health care professionals or caregivers) and documentation in the medical record.

## 2022-08-13 NOTE — Progress Notes (Signed)
Date and time results received: 08/13/22 0559 (use smartphrase ".now" to insert current time)  Test: WBC 0.8, ABSOLUTE NEUTRAPHIL 0.3  Name of Provider Notified: Pola Corn MD

## 2022-08-13 NOTE — Progress Notes (Addendum)
NAME:  Tyler [name], MRN:  [id number], DOB:  1951-02-19, LOS: 15 ADMISSION DATE:  07/29/2022 CONSULTATION DATE:  08/08/2022 REFERRING MD:  Hanley Ben - TR[facility] CHIEF COMPLAINT: Sepsis, ARF with need for RRT   History of Present Illness:  72 yo male with low grade myelodysplastic syndrome found to have generalized lymphadenopathy from high grade large B cell lymphoma. Plan to start on R-CHOP. Came to ER with near syncope. He had fever and hypotension with elevated lactic acid level. Started on ABx. He had seizure like activity, and Rt facial paresthesia. He developed renal failure. Seen by cardiology for PAF. Had PICC placed and started on R mini CHOP on 08/05/22. Renal fx got worse and nephrology consulted. Had elevated uric acid with concern for tumor lysis syndrome and received rasburicase. Rituxan held because of this. Renal fx and mental status got worse. Concern for aspiration pneumonitis. Nephrology recommending to start CRRT. PCCM consulted.   Pertinent Medical History:  Prostate cancer, MDS, B cell lymphoma, PE, HTN, PAF, Recurrent UTI  Significant Hospital Events: Including procedures, antibiotic start and stop dates in addition to other pertinent events   6/03 Presented to ED for fever, near-syncope. Presumed sepsis. Ad[facility] to [facility]. 6/13 Worsening acute renal failure as evidenced by rising BUN/Cr, hyperkalemia to 5.6. PCCM consulted for ARF/need for RRT, line placement. Intubated. R IJ Trialysis catheter placed. CRRT started. 6/15 transfuse 1 unit PRBC 6/16 extubate 6/17 no acute events overnight, remains on CRRT   Interim History / Subjective:  Remains on CRRT, family at bedside Flat affect and minimally verbal  Objective:  Blood pressure 126/60, pulse 84, temperature (!) 97.5 F (36.4 C), temperature source Axillary, resp. rate 16, height 5\' 7"  (1.702 m), weight 79.3 kg, SpO2 100 %. CVP:  [0 mmHg-11 mmHg] 1 mmHg      Intake/Output Summary (Last 24 hours) at 08/13/2022 0857 Last data  filed at 08/13/2022 0800 Gross per 24 hour  Intake 2848.69 ml  Output 2018.1 ml  Net 830.59 ml    Filed Weights   08/11/22 0420 08/12/22 0500 08/13/22 0500  Weight: 78.3 kg 79.7 kg 79.3 kg   Physical Examination: General: Acute on chronic ill-appearing elderly male lying in bed in no acute distress HEENT: [place]/AT, MM pink/moist, PERRL,  Neuro: Alert and interactive, does not engage very much with interviewer.  He understands [place] but primarily speaks Swahili CV: s1s2 regular rate and rhythm, no murmur, rubs, or gallops,  PULM: Diminished bilaterally, no increased work of breathing, on RA GI: soft, bowel sounds active in all 4 quadrants, non-tender, non-distended, tolerating tube feeds Extremities: warm/dry, no edema  Skin: no rashes or lesions  Resolved Hospital Problem List:  Acute hypoxic respiratory failure with compromised airway  -Extubated 6/16 Hyperkalemia  Assessment & Plan:   Acute renal failure: Suspect multifactorial related to possible sepsis as well as contribution of tumor lysis syndrome given elevated uric acid etc. -- Status post rasburicase x 2 -- Dialysis per nephrology, normotensive, continuing CRRT at [facility] long, consideration of transition to [facility] when appropriate, ongoing discussions with nephology and hematology/oncology   Encephalopathy: Likely multifactorial related to improving uremia, delirium, sepsis. --improving   Acute hypoxemic respiratory failure due to aspiration: -- Extubated 6/16 -- Continue antibiotics, zosyn 5 days end date 6/18 -- Goal O2 sat 88% or greater, wean oxygen as tolerated   Paroxysmal A-fib: No AC due to history of bleeding -- Continue amiodarone   Pancytopenia: -- Transfuse hemoglobin greater than 7, platelets greater than 20 -- Granix per  hematology/oncology   Tumor lysis syndrome: -- Continue allopurinol   Best Practice: (right click and "Reselect all SmartList Selections" daily)   Diet/type: tubefeeds DVT  prophylaxis: SCDs GI prophylaxis: PPI Lines: Central line and Dialysis Catheter Foley:  Yes, and it is still needed Code Status:  full code Last date of multidisciplinary goals of care discussion [updated his daughter, Karena Addison, by phone on 6/16]  Critical care time:   CRITICAL CARE Performed by: Karren Burly  Total critical care time: 33 minutes  Critical care time was exclusive of separately billable procedures and treating other patients.  Critical care was necessary to treat or prevent imminent or life-threatening deterioration.  Critical care was time spent personally by me on the following activities: development of treatment plan with patient and/or surrogate as well as nursing, discussions with consultants, evaluation of patient's response to treatment, examination of patient, obtaining history from patient or surrogate, ordering and performing treatments and interventions, ordering and review of laboratory studies, ordering and review of radiographic studies, pulse oximetry and re-evaluation of patient's condition.  Karren Burly, MD [facility] Personal contact information can be found on Amion  If no contact or response made please call [phone] 08/13/2022, 8:57 AM

## 2022-08-13 NOTE — TOC Progression Note (Signed)
Transition of Care [facility]) - Progression Note    Patient Details  Name: Tyler [name] MRN: [id number] Date of Birth: 26-Apr-1950  Transition of Care [facility]) CM/SW Contact  Beckie Busing, RN Phone Number:[phone]  08/13/2022, 1:56 PM  Clinical Narrative:    TOC continues to follow patient. Patient is currently on CRRT. Disposition TBD.    Expected Discharge Plan: Home/Self Care Barriers to Discharge: Continued Medical Work up  Expected Discharge Plan and Services                                               Social Determinants of Health (SDOH) Interventions SDOH Screenings   Food Insecurity: No Food Insecurity (07/30/2022)  Housing: Low Risk  (07/30/2022)  Transportation Needs: No Transportation Needs (07/30/2022)  Utilities: Not At Risk (07/30/2022)  Tobacco Use: Low Risk  (08/08/2022)    Readmission Risk Interventions    07/30/2022    2:44 PM  Readmission Risk Prevention Plan  Transportation Screening Complete  PCP or Specialist Appt within 3-5 Days Complete  HRI or Home Care Consult Complete  Social Work Consult for Recovery Care Planning/Counseling Complete  Palliative Care Screening Not Applicable  Medication Review [employer]) Complete

## 2022-08-14 ENCOUNTER — Other Ambulatory Visit: Payer: Self-pay | Admitting: Hematology

## 2022-08-14 DIAGNOSIS — A419 Sepsis, unspecified organism: Secondary | ICD-10-CM | POA: Diagnosis not present

## 2022-08-14 DIAGNOSIS — D61810 Antineoplastic chemotherapy induced pancytopenia: Secondary | ICD-10-CM | POA: Diagnosis not present

## 2022-08-14 DIAGNOSIS — Z5111 Encounter for antineoplastic chemotherapy: Secondary | ICD-10-CM | POA: Diagnosis not present

## 2022-08-14 DIAGNOSIS — R6520 Severe sepsis without septic shock: Secondary | ICD-10-CM | POA: Diagnosis not present

## 2022-08-14 DIAGNOSIS — T451X5A Adverse effect of antineoplastic and immunosuppressive drugs, initial encounter: Secondary | ICD-10-CM | POA: Diagnosis not present

## 2022-08-14 DIAGNOSIS — C8338 Diffuse large B-cell lymphoma, lymph nodes of multiple sites: Secondary | ICD-10-CM | POA: Diagnosis not present

## 2022-08-14 LAB — CBC WITH DIFFERENTIAL/PLATELET
Abs Immature Granulocytes: 0 10*3/uL (ref 0.00–0.07)
Basophils Absolute: 0 10*3/uL (ref 0.0–0.1)
Basophils Relative: 0 %
Eosinophils Absolute: 0.1 10*3/uL (ref 0.0–0.5)
Eosinophils Relative: 9 %
HCT: 25.3 % — ABNORMAL LOW (ref 39.0–52.0)
Hemoglobin: 7.9 g/dL — ABNORMAL LOW (ref 13.0–17.0)
Immature Granulocytes: 0 %
Lymphocytes Relative: 59 %
Lymphs Abs: 0.3 10*3/uL — ABNORMAL LOW (ref 0.7–4.0)
MCH: 27.5 pg (ref 26.0–34.0)
MCHC: 31.2 g/dL (ref 30.0–36.0)
MCV: 88.2 fL (ref 80.0–100.0)
Monocytes Absolute: 0.1 10*3/uL (ref 0.1–1.0)
Monocytes Relative: 11 %
Neutro Abs: 0.1 10*3/uL — CL (ref 1.7–7.7)
Neutrophils Relative %: 21 %
Platelets: 28 10*3/uL — CL (ref 150–400)
RBC: 2.87 MIL/uL — ABNORMAL LOW (ref 4.22–5.81)
RDW: 16.3 % — ABNORMAL HIGH (ref 11.5–15.5)
WBC: 0.5 10*3/uL — CL (ref 4.0–10.5)
nRBC: 0 % (ref 0.0–0.2)

## 2022-08-14 LAB — RENAL FUNCTION PANEL
Albumin: 1.9 g/dL — ABNORMAL LOW (ref 3.5–5.0)
Albumin: 2 g/dL — ABNORMAL LOW (ref 3.5–5.0)
Anion gap: 6 (ref 5–15)
Anion gap: 5 (ref 5–15)
BUN: 31 mg/dL — ABNORMAL HIGH (ref 8–23)
BUN: 34 mg/dL — ABNORMAL HIGH (ref 8–23)
CO2: 26 mmol/L (ref 22–32)
CO2: 25 mmol/L (ref 22–32)
Calcium: 7.5 mg/dL — ABNORMAL LOW (ref 8.9–10.3)
Calcium: 7.4 mg/dL — ABNORMAL LOW (ref 8.9–10.3)
Chloride: 104 mmol/L (ref 98–111)
Chloride: 105 mmol/L (ref 98–111)
Creatinine, Ser: 1.47 mg/dL — ABNORMAL HIGH (ref 0.61–1.24)
Creatinine, Ser: 1.6 mg/dL — ABNORMAL HIGH (ref 0.61–1.24)
GFR, Estimated: 51 mL/min — ABNORMAL LOW (ref >60)
GFR, Estimated: 46 mL/min — ABNORMAL LOW (ref >60)
Glucose, Bld: 144 mg/dL — ABNORMAL HIGH (ref 70–99)
Glucose, Bld: 155 mg/dL — ABNORMAL HIGH (ref 70–99)
Phosphorus: 1.7 mg/dL — ABNORMAL LOW (ref 2.5–4.6)
Phosphorus: 1.5 mg/dL — ABNORMAL LOW (ref 2.5–4.6)
Potassium: 4 mmol/L (ref 3.5–5.1)
Potassium: 4.3 mmol/L (ref 3.5–5.1)
Sodium: 136 mmol/L (ref 135–145)
Sodium: 135 mmol/L (ref 135–145)

## 2022-08-14 LAB — GLUCOSE, CAPILLARY
Glucose-Capillary: 137 mg/dL — ABNORMAL HIGH (ref 70–99)
Glucose-Capillary: 123 mg/dL — ABNORMAL HIGH (ref 70–99)
Glucose-Capillary: 123 mg/dL — ABNORMAL HIGH (ref 70–99)
Glucose-Capillary: 115 mg/dL — ABNORMAL HIGH (ref 70–99)
Glucose-Capillary: 110 mg/dL — ABNORMAL HIGH (ref 70–99)

## 2022-08-14 LAB — MAGNESIUM: Magnesium: 2.5 mg/dL — ABNORMAL HIGH (ref 1.7–2.4)

## 2022-08-14 MED ORDER — SODIUM PHOSPHATES 45 MMOLE/15ML IV SOLN
45.0000 mmol | Freq: Once | INTRAVENOUS | Status: AC
  Administered 2022-08-14: 45 mmol via INTRAVENOUS
  Filled 2022-08-14: qty 15

## 2022-08-14 NOTE — Progress Notes (Signed)
 [facility] Progress Note   Assessment/ Plan:   AKI - b/l creatinine 0.6- 0.9 here on admission. Creat increased up to 1.3 on 6/10 and 2.3 on 6/11.  UA showed ^wbc, min rbc's/ protein. Renal US showed no obstruction. 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 Uric acid/ hyperkalemia peaked and now have recovered w/ CRRT and rasburicase. CRRT started 6/13. No signs of renal recovery, cont CRRT.    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 UA down to 2- 3 range. Per ONC.  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  Finishing up ritux infusion.  Tolerating well.  Getting filter change for CRRT- no clotting.       Objective:   BP 128/80   Pulse 91   Temp 98.3 F (36.8 C) (Oral)   Resp 20   Ht 5\' 7"  (1.702 m)   Wt 79 kg   SpO2 98%   BMI 27.28 kg/m   Physical Exam: Gen:NAD, lying in bed CVS: RRR Resp: clear Abd: soft Ext: trace LE edema ACCESS: R internal jugular nontunneled HD catheter  Labs: BMET Recent Labs  Lab 08/11/22 0316 08/11/22 1544 08/12/22 0515 08/12/22 1400 08/13/22 0300 08/13/22 1629 08/14/22 0425  NA 134* 136 137 136 137 136 136  K 3.7 3.4* 3.3* 3.7 3.7 3.9 4.0  CL 99 102 101 102 104 105 104  CO2 25 26 28 23 28 26 26   GLUCOSE 185* 200* 114* 106* 107* 140* 144*  BUN 52* 52* 41* 33* 34* 29* 31*  CREATININE 1.58* 1.53* 1.44* 1.46* 1.36* 1.39* 1.47*  CALCIUM 7.4* 7.5* 7.5* 7.4* 7.5* 7.5* 7.5*  PHOS 3.7 3.6 3.1 2.9 2.9 2.1* 1.7*   CBC Recent Labs  Lab 08/11/22 0316 08/12/22 1400 08/13/22 0300 08/14/22 0425  WBC 7.0 1.5* 0.8* 0.5*  NEUTROABS 4.3 0.9* 0.3* 0.1*  HGB 7.9* 6.2* 7.9* 7.9*  HCT 24.2* 20.1* 24.8* 25.3*  MCV  84.9 89.7 87.0 88.2  PLT 20* 9* 34* 28*      Medications:     acetaminophen  650 mg Per Tube Once   allopurinol  100 mg Per Tube BID   amiodarone  200 mg Per Tube Daily   B-complex with vitamin C  1 tablet Per Tube Daily   Chlorhexidine Gluconate Cloth  6 each Topical Q2200   docusate  100 mg Per Tube BID   feeding supplement (PROSource TF20)  60 mL Per Tube TID   insulin aspart  0-6 Units Subcutaneous Q4H   mouth rinse  15 mL Mouth Rinse 4 times per day   pantoprazole (PROTONIX) IV  40 mg Intravenous Daily   polyethylene glycol  17 g Per Tube Daily   senna  2 tablet Per NG tube Daily   sodium chloride flush  10-40 mL Intracatheter Q12H   Tbo-filgastrim (GRANIX) SQ  480 mcg Subcutaneous q1800     Bufford Buttner, MD 08/14/2022, 1:05 PM

## 2022-08-14 NOTE — Progress Notes (Signed)
[name]  HEMATOLOGY/ONCOLOGY INPATIENT PROGRESS NOTE  Date of Service: 08/14/22   Inpatient Attending: .Hunsucker, Lesia Sago, MD  SUBJECTIVE  Patient was seen by me on 08/12/2022 and had been extubated. He continued to have CRRT.  He was more verbal at the time.  Patient was receiving NG tube feeding and was on nasal cannula oxygen. He noted no pain or overt shortness of breath.  Today, he reports having abdominal pain sometimes. His abdomen is softer on examination. He is tolerating tube feeding well, his breathing is stable, and is more awake. Patient's lymph node in axillary has decreased in size. He denies any back pain and has no leg swelling. Pt has no family at bedside  OBJECTIVE:  ***  PHYSICAL EXAMINATION: . Vitals:   08/14/22 0900 08/14/22 0921 08/14/22 0952 08/14/22 1000  BP: 115/66 129/67 136/66 124/65  Pulse: 91 93 93 92  Resp: 18 19 18 20   Temp:      TempSrc:      SpO2: 98% 99% 99% 95%  Weight:      Height:       Filed Weights   08/12/22 0500 08/13/22 0500 08/14/22 0500  Weight: 79.7 kg 79.3 kg 79 kg   .Body mass index is 27.28 kg/m. [name] GENERAL: Awake and alert.  NG tube in situ. LUNGS: clear to auscultation b/l with normal respiratory effort, few scattered rhonchi HEART: regular rate & rhythm ABDOMEN:  normoactive bowel sounds , non tender, not distended. Extremity: 1+ b/l pedal edema   MEDICAL HISTORY:  Past Medical History:  Diagnosis Date   Cancer [facility])    prostate   Follicular lymphoma (HCC) 2024   History of pulmonary embolism    Hypertension    Myelodysplastic syndrome (HCC)    PAF (paroxysmal atrial fibrillation) (HCC)    Recurrent UTI     SURGICAL HISTORY: Past Surgical History:  Procedure Laterality Date   LYMPHADENECTOMY Bilateral 09/01/2019   Procedure: LYMPHADENECTOMY;  Surgeon: Sebastian Ache, MD;  Location: [facility];  Service: Urology;  Laterality: Bilateral;   ROBOT ASSISTED LAPAROSCOPIC RADICAL PROSTATECTOMY N/A 09/01/2019    Procedure: XI ROBOTIC ASSISTED LAPAROSCOPIC RADICAL PROSTATECTOMY;  Surgeon: Sebastian Ache, MD;  Location: [facility];  Service: Urology;  Laterality: N/A;  3 HRS    SOCIAL HISTORY: Social History   Socioeconomic History   Marital status: Married    Spouse name: Not on file   Number of children: Not on file   Years of education: Not on file   Highest education level: Not on file  Occupational History   Not on file  Tobacco Use   Smoking status: Never   Smokeless tobacco: Never  Vaping Use   Vaping Use: Never used  Substance and Sexual Activity   Alcohol use: Yes    Comment: at least one 40 oz beer daily   Drug use: Never   Sexual activity: Not on file  Other Topics Concern   Not on file  Social History Narrative   Not on file   Social Determinants of Health   Financial Resource Strain: Not on file  Food Insecurity: No Food Insecurity (07/30/2022)   Hunger Vital Sign    Worried About Running Out of Food in the Last Year: Never true    Ran Out of Food in the Last Year: Never true  Transportation Needs: No Transportation Needs (07/30/2022)   PRAPARE - Administrator, Civil Service (Medical): No    Lack of Transportation (Non-Medical): No  Physical Activity:  Not on file  Stress: Not on file  Social Connections: Not on file  Intimate Partner Violence: Not At Risk (07/30/2022)   Humiliation, Afraid, Rape, and Kick questionnaire    Fear of Current or Ex-Partner: No    Emotionally Abused: No    Physically Abused: No    Sexually Abused: No    FAMILY HISTORY: Family History  Problem Relation Age of Onset   Stroke Father     ALLERGIES:  is allergic to amitriptyline, amlodipine, and neurontin [gabapentin].  MEDICATIONS:  Scheduled Meds:  acetaminophen  650 mg Per Tube Once   allopurinol  100 mg Per Tube BID   amiodarone  200 mg Per Tube Daily   B-complex with vitamin C  1 tablet Per Tube Daily   Chlorhexidine Gluconate Cloth  6 each Topical Q2200   docusate   100 mg Per Tube BID   feeding supplement (PROSource TF20)  60 mL Per Tube TID   insulin aspart  0-6 Units Subcutaneous Q4H   mouth rinse  15 mL Mouth Rinse 4 times per day   pantoprazole (PROTONIX) IV  40 mg Intravenous Daily   polyethylene glycol  17 g Per Tube Daily   senna  2 tablet Per NG tube Daily   sodium chloride flush  10-40 mL Intracatheter Q12H   Tbo-filgastrim (GRANIX) SQ  480 mcg Subcutaneous q1800   Continuous Infusions:   prismasol BGK 4/2.5 400 mL/hr at 08/13/22 2326    prismasol BGK 4/2.5 400 mL/hr at 08/13/22 2323   sodium chloride Stopped (08/14/22 0701)   sodium chloride Stopped (08/13/22 0021)   feeding supplement (VITAL 1.5 CAL) 60 mL/hr at 08/14/22 1000   phenylephrine (NEO-SYNEPHRINE) Adult infusion Stopped (08/13/22 0630)   prismasol BGK 4/2.5 1,500 mL/hr at 08/14/22 0835   PRN Meds:.sodium chloride, chlorproMAZINE, heparin sodium (porcine), ipratropium-albuterol, lip balm, melatonin, naphazoline-glycerin, ondansetron **OR** ondansetron (ZOFRAN) IV, mouth rinse, oxyCODONE, phenol, sodium chloride flush, traZODone  REVIEW OF SYSTEMS:    10 Point review of Systems was done is negative except as noted above.   LABORATORY DATA:  I have reviewed the data as listed .CBC    Component Value Date/Time   WBC 0.5 (LL) 08/14/2022 0425   RBC 2.87 (L) 08/14/2022 0425   HGB 7.9 (L) 08/14/2022 0425   HGB 8.1 (L) 07/19/2022 1432   HCT 25.3 (L) 08/14/2022 0425   PLT 28 (LL) 08/14/2022 0425   PLT 226 07/19/2022 1432   MCV 88.2 08/14/2022 0425   MCH 27.5 08/14/2022 0425   MCHC 31.2 08/14/2022 0425   RDW 16.3 (H) 08/14/2022 0425   LYMPHSABS 0.3 (L) 08/14/2022 0425   MONOABS 0.1 08/14/2022 0425   EOSABS 0.1 08/14/2022 0425   BASOSABS 0.0 08/14/2022 0425   .    Latest Ref Rng & Units 08/12/2022    2:00 PM 08/11/2022    3:16 AM  CBC  WBC 4.0 - 10.5 K/uL 1.5  7.0   Hemoglobin 13.0 - 17.0 g/dL 6.2  7.9   Hematocrit 16.1 - 52.0 % 20.1  24.2   Platelets 150 - 400  K/uL 9  20    .    Latest Ref Rng & Units 08/12/2022    2:00 PM 08/12/2022    5:15 AM  CMP  Glucose 70 - 99 mg/dL 096  045   BUN 8 - 23 mg/dL 33  41   Creatinine 4.09 - 1.24 mg/dL 8.11  9.14   Sodium 782 - 145 mmol/L 136  137  Potassium 3.5 - 5.1 mmol/L 3.7  3.3   Chloride 98 - 111 mmol/L 102  101   CO2 22 - 32 mmol/L 23  28   Calcium 8.9 - 10.3 mg/dL 7.4  7.5   Total Protein 6.5 - 8.1 g/dL  4.2   Total Bilirubin 0.3 - 1.2 mg/dL  2.5   Alkaline Phos 38 - 126 U/L  79   AST 15 - 41 U/L  31   ALT 0 - 44 U/L  31      RADIOGRAPHIC STUDIES: I have personally reviewed the radiological images as listed and agreed with the findings in the report. DG CHEST PORT 1 VIEW  Result Date: 08/11/2022 CLINICAL DATA:  [id number] Follow-up exam [id number] EXAM: PORTABLE CHEST 1 VIEW COMPARISON:  August 09, 2022 FINDINGS: The cardiomediastinal silhouette is unchanged and enlarged in contour with widening of the superior paramediastinal stripes.ETT tip terminates 4.8 cm above the carina. Enteric tube tip and side port project over the proximal stomach. RIGHT upper extremity PICC tip terminates over the RIGHT atrium. RIGHT neck CVC tip terminates over the inferior SVC. Layering bilateral pleural effusions. No pneumothorax. Hazy bibasilar opacities most consistent with atelectasis. IMPRESSION: 1. Support apparatus as described above. 2. Layering bilateral pleural effusions with associated atelectasis. Electronically Signed   By: Meda Klinefelter M.D.   On: 08/11/2022 11:20   DG CHEST PORT 1 VIEW  Result Date: 08/09/2022 CLINICAL DATA:  71 year old male with PICC line pulled out slightly. High-grade large B-cell lymphoma. EXAM: PORTABLE CHEST 1 VIEW COMPARISON:  Portable chest 08/08/2022 and earlier. FINDINGS: Portable AP view at 0401 hours. Similar lordotic positioning. Right PICC line tip position at the right mediastinal contour appears stable from yesterday, cavoatrial junction level when allowing for lower lung  volumes. Stable right IJ approach central line. Endotracheal tube tip remains at the level the clavicles. Enteric tube courses to the abdomen, tip not included. Stable bulky mediastinal widening seen on CT 08/03/2022 secondary to malignant appearing lymphadenopathy. Stable cardiac size and mediastinal contours. Ongoing low lung volumes, lower since yesterday. Veiling opacity at the left lung base, probably in part related to layering pleural effusions seen on the CT. Paucity of bowel gas in the upper abdomen. IMPRESSION: 1. Right PICC line tip position appears stable from yesterday, cavoatrial junction level when allowing for lower lung volumes. 2. Other lines and tubes stable. Lower lung volumes, with continued left lung veiling opacity at least in part due to pleural effusion seen recently by CT. 3. Stable bulky mediastinal widening secondary to malignant lymphadenopathy. Electronically Signed   By: Odessa Fleming M.D.   On: 08/09/2022 04:35   DG CHEST PORT 1 VIEW  Result Date: 08/08/2022 CLINICAL DATA:  Status post intubation EXAM: PORTABLE CHEST 1 VIEW COMPARISON:  08/08/2022 FINDINGS: Cardiac shadow is within normal limits. Endotracheal tube, gastric catheter, right PICC and right jugular dialysis catheter are again noted and stable. Persistent widening of the mediastinum is seen consistent with the known adenopathy. Small left effusion is noted. No focal infiltrate is seen. IMPRESSION: Tubes and lines as described above. Overall appearance is stable with widened mediastinum. New small left effusion is seen. Electronically Signed   By: Alcide Clever M.D.   On: 08/08/2022 23:52   DG Chest Port 1 View  Result Date: 08/08/2022 CLINICAL DATA:  Central line placement EXAM: PORTABLE CHEST 1 VIEW COMPARISON:  Chest radiograph 08/05/2022 FINDINGS: The endotracheal tube tip is proximally 3.5 cm from the carina. The right IJ vascular sheath terminates  in the mid SVC. A right upper extremity PICC terminates in the region  of the cavoatrial junction. The enteric catheter tip courses off the field of view. The side-hole is in the stomach. The heart size is stable. Widening of the upper mediastinum is unchanged consistent with known lymphadenopathy seen on prior CT. There is no focal consolidation or pulmonary edema. There is no pleural effusion or pneumothorax There is no acute osseous abnormality. IMPRESSION: 1. Support devices as above in appropriate position. 2. Unchanged widening of the mediastinum consistent with known lymphadenopathy. No new or worsening focal airspace disease. Electronically Signed   By: Lesia Hausen M.D.   On: 08/08/2022 14:23   DG Abd Portable 1V  Result Date: 08/08/2022 CLINICAL DATA:  Ileus EXAM: PORTABLE ABDOMEN - 1 VIEW COMPARISON:  One-view abdomen 08/06/2022 FINDINGS: The bowel gas pattern is now normal. No residual distended bowel is present. Side port of the NG tube is in the stomach. No radio-opaque calculi or other significant radiographic abnormality are seen. IMPRESSION: 1. Resolution of ileus. 2. Side port of the NG tube is in the stomach. Electronically Signed   By: Marin Roberts M.D.   On: 08/08/2022 09:31   DG Abd 1 View  Result Date: 08/06/2022 CLINICAL DATA:  Abdominal distension. EXAM: ABDOMEN - 1 VIEW COMPARISON:  Radiograph yesterday FINDINGS: Tip and side port of the enteric tube below the diaphragm in the stomach. No small bowel distension or evidence of obstruction. There is mild gaseous distension of transverse and descending colon. Small volume of formed stool in the right colon. No radiopaque calculi. No acute osseous abnormalities are seen. IMPRESSION: 1. Tip and side port of the enteric tube below the diaphragm in the stomach. 2. Mild gaseous distension of transverse and descending colon which may represent colonic ileus. No small bowel obstruction. Electronically Signed   By: Narda Rutherford M.D.   On: 08/06/2022 18:14   US RENAL  Result Date:  08/06/2022 CLINICAL DATA:  Acute kidney injury. EXAM: RENAL / URINARY TRACT ULTRASOUND COMPLETE COMPARISON:  CT 07/29/2022 FINDINGS: Right Kidney: Renal measurements: 10 x 5.3 x 6.6 cm = volume: 183 mL. No hydronephrosis. Normal parenchymal echogenicity. 3.6 cm cyst arises from the lower pole. This needs no further imaging follow-up. No visible stone or solid lesion. Additional cyst on prior CT not well seen. Left Kidney: Renal measurements: 10.8 x 5.6 x 5.3 cm = volume: 167 mL. No hydronephrosis. Normal parenchymal echogenicity. 2.9 cm cyst in the lower pole. Some of the smaller cysts on CT are not seen by ultrasound. No visible stone or solid lesion. Bladder: Not seen, may be empty. Other: None. IMPRESSION: No obstructive uropathy. Electronically Signed   By: Narda Rutherford M.D.   On: 08/06/2022 18:12   DG Abd 1 View  Result Date: 08/05/2022 CLINICAL DATA:  Nasogastric tube placement. EXAM: ABDOMEN - 1 VIEW COMPARISON:  August 05, 2022 FINDINGS: A nasogastric tube is seen with its distal tip overlying the expected region of the gastric antrum. A small amount of radiopaque contrast is seen overlying the expected region of the gastric fundus. The bowel gas pattern is normal. No radio-opaque calculi are seen. IMPRESSION: Nasogastric tube positioning, as described above. Electronically Signed   By: Aram Candela M.D.   On: 08/05/2022 19:05   DG Abd 1 View  Result Date: 08/05/2022 CLINICAL DATA:  [id number] Vomiting [id number] EXAM: ABDOMEN - 1 VIEW COMPARISON:  08/01/2022 FINDINGS: The bowel gas pattern is normal. residual oral contrastIn the gastric fundus.  No radio-opaque calculi or other significant radiographic abnormality are seen. IMPRESSION: Negative. Electronically Signed   By: Corlis Leak M.D.   On: 08/05/2022 17:02   DG Chest Port 1V same Day  Result Date: 08/05/2022 CLINICAL DATA:  Dyspnea EXAM: PORTABLE CHEST 1 VIEW COMPARISON:  08/03/2022 FINDINGS: Cardiac shadow is within normal limits. Persistent  widening of the superior mediastinum is noted related to lymphadenopathy. Lungs are clear bilaterally. No bony abnormality is noted. IMPRESSION: Persistent lymphadenopathy with mediastinal widening. No other focal abnormality is seen. Electronically Signed   By: Alcide Clever M.D.   On: 08/05/2022 11:11   [place] EKG SITE RITE  Result Date: 08/05/2022 If Site Rite image not attached, placement could not be confirmed due to current cardiac rhythm.  CT CHEST W CONTRAST  Result Date: 08/03/2022 CLINICAL DATA:  Inpatient. Abnormal chest radiograph. Chest wall mass. * Tracking Code: BO * EXAM: CT CHEST WITH CONTRAST TECHNIQUE: Multidetector CT imaging of the chest was performed during intravenous contrast administration. RADIATION DOSE REDUCTION: This exam was performed according to the departmental dose-optimization program which includes automated exposure control, adjustment of the mA and/or kV according to patient size and/or use of iterative reconstruction technique. CONTRAST:  75mL OMNIPAQUE IOHEXOL 300 MG/ML  SOLN COMPARISON:  Chest radiograph from one day prior. 07/07/2022 chest CT. FINDINGS: Cardiovascular: Normal heart size. No significant pericardial effusion/thickening. Left anterior descending coronary atherosclerosis. Great vessels are normal in course and caliber. No central pulmonary emboli. Mediastinum/Nodes: No significant thyroid nodules. Unremarkable esophagus. Bulky bilateral axillary adenopathy, increased from 07/07/2022. Representative short axis diameter 3.8 cm right axillary node (series 3/image 57), previously 2.7 cm. Bilateral bulky lower neck adenopathy is increased. Representative left lower neck 3.0 cm node (series 3/image 6), previously 1.8 cm. Bulky bilateral mediastinal adenopathy in the prevascular, AP window, right paratracheal and subcarinal chains, increased. Representative 3.0 cm right paratracheal node (series 3/image 53), previously 1.8 cm. Increased bilateral hilar adenopathy.  Representative 2.0 cm left hilar node (series 3/image 77), previously 1.2 cm. Lungs/Pleura: No pneumothorax. Small dependent bilateral pleural effusions with associated compressive mild-to-moderate atelectasis in the posterior lower lobes bilaterally, new. No lung masses or significant pulmonary nodules in the aerated portions of the lungs. Upper abdomen: Multiple splenic masses, for example 3.0 cm medially (series 3/image 131), not previously discretely visualized. Bulky upper abdominal lymphadenopathy in the gastrohepatic ligament, porta hepatis, portacaval, aortocaval and left para-aortic chains, increased. Representative 2.1 cm gastrohepatic ligament node (series 3/image 130), previously 1.8 cm. Representative 2.0 cm left para-aortic node (series 3/image 155), previously 1.3 cm. Interval growth of multiple upper omental soft tissue nodules, largest 3.5 x 2.2 cm (series 3/image 153), previously 2.0 x 1.2 cm. Musculoskeletal: No aggressive appearing focal osseous lesions. Moderate thoracic spondylosis. IMPRESSION: 1. Rapid interval progression of bulky bilateral axillary, bilateral hilar, bilateral mediastinal, bilateral lower neck and upper abdominal lymphadenopathy since recent 07/07/2022 chest CT. Multiple new splenic masses. Enlarging upper omental soft tissue nodules. 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 The axillary adenopathy should be amenable to percutaneous biopsy. 2. Small dependent bilateral pleural effusions with associated compressive mild-to-moderate posterior lower lobe atelectasis, new. 3. One vessel coronary atherosclerosis. Electronically Signed   By: Delbert Phenix M.D.   On: 08/03/2022 12:02   DG Chest Port 1V same Day  Result Date: 08/02/2022 CLINICAL DATA:  Dyspnea shortness of breath EXAM: PORTABLE CHEST 1 VIEW COMPARISON:  Multiple exams, including 07/31/2022 FINDINGS:  Substantially widened  mediastinum at 14.9 cm presumably due to adenopathy given findings on prior imaging workups. This is stable compared to 07/31/2022 and substantially increased from 05/23/2022, and questionably increased from 07/06/2022 (AP projection on today's exam may be magnify the mediastinum more than prior PA projections and CT imaging). Heart size within normal limits. Hazy indistinct density at the left lung base possibly from atelectasis or pneumonia, minimally improved from previous. IMPRESSION: 1. Substantially widened mediastinum at 14.9 cm, presumably due to adenopathy given previous chest CT findings. 2. Hazy indistinct density at the left lung base possibly from atelectasis or pneumonia, minimally improved from previous. Electronically Signed   By: Gaylyn Rong M.D.   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 intake, tachycardia and episode of shaking and unresponsiveness then confused thereafter. EEG to evaluate for seizure Level of alertness: Awake AEDs during EEG study: None Technical aspects: This EEG study was done with scalp electrodes positioned according to the 10-20 International system of electrode placement. Electrical activity was reviewed with band pass filter of 1-70Hz , sensitivity of 7 uV/mm, display speed of 23mm/sec with a 60Hz  notched filter applied as appropriate. EEG data were recorded continuously and digitally stored.  Video monitoring was available and reviewed as appropriate. Description: The posterior dominant rhythm consists of 7.5 Hz activity of moderate voltage (25-35 uV) seen predominantly in posterior head  regions, symmetric and reactive to eye opening and eye closing. EEG showed intermittent generalized sharply contoured high amplitude 2-3hz  delta slowing. Hyperventilation and photic stimulation were not performed.   ABNORMALITY - Intermittent slow, generalized IMPRESSION: This study is suggestive of mild diffuse encephalopathy, nonspecific etiology. No seizures or epileptiform discharges were seen throughout the recording.  If suspicion for interictal activity remains a concern, a prolonged study can be considered. Charlsie Quest   DG Abd 1 View  Result Date: 08/01/2022 CLINICAL DATA:  Abdominal distension. EXAM: ABDOMEN - 1 VIEW COMPARISON:  CT AP 07/29/2022 FINDINGS: No dilated loops of large or small bowel identified. Stool noted within the colon. No abnormal abdominal or pelvic calcifications. Visualized osseous structures appear intact. IMPRESSION: Nonobstructive bowel gas pattern. Electronically Signed   By: Signa Kell M.D.   On: 08/01/2022 12:41   DG Chest Port 1 View  Result Date: 07/31/2022 CLINICAL DATA:  Possible aspiration EXAM: PORTABLE CHEST 1 VIEW COMPARISON:  07/29/2022, 07/07/2022 FINDINGS: Cardiac shadow is stable. Widened mediastinum is again identified accentuated by the portable technique. This is consistent with the known mediastinal adenopathy seen on prior CT. Lungs are well aerated bilaterally. Mild bibasilar atelectasis is seen. No sizable effusion is noted. No bony abnormality is seen. IMPRESSION: Widened mediastinum consistent with the known mediastinal adenopathy. Mild bibasilar atelectasis. Electronically Signed   By: Alcide Clever M.D.   On: 07/31/2022 23:06   CT MAXILLOFACIAL WO CONTRAST  Result Date: 07/30/2022 CLINICAL DATA:  Right facial pain EXAM: CT MAXILLOFACIAL WITHOUT CONTRAST TECHNIQUE: Multidetector CT imaging of the maxillofacial structures was performed. Multiplanar CT image reconstructions were also generated. RADIATION DOSE REDUCTION: This exam was  performed according to the departmental dose-optimization program which includes automated exposure control, adjustment of the mA and/or kV according to patient size  and/or use of iterative reconstruction technique. COMPARISON:  None Available. FINDINGS: Osseous: No fracture or mandibular dislocation. No destructive process. Large periapical lucency at the root of the most posterior remaining right maxillary 2. Orbits: Negative. No traumatic or inflammatory finding. Sinuses: Right anterior ethmoid and maxillary sinus complete opacification with polypoid contours. Soft tissues: Multiple enlarged submandibular lymph nodes, measuring up to 1.6 cm. No abscess or fluid collection. Limited intracranial: No significant or unexpected finding. IMPRESSION: 1. Large periapical lucency at the root of the most posterior remaining right maxillary tooth. No associated abscess. 2. Right anterior ethmoid and maxillary sinus complete opacification with polypoid contours, possibly antrochoanal polyp. 3. Multiple enlarged submandibular lymph nodes, measuring up to 1.6 cm, likely reactive. Electronically Signed   By: Deatra Robinson M.D.   On: 07/30/2022 20:38   MR BRAIN W WO CONTRAST  Result Date: 07/30/2022 CLINICAL DATA:  Seizure and headache EXAM: MRI HEAD WITHOUT AND WITH CONTRAST TECHNIQUE: Multiplanar, multiecho pulse sequences of the brain and surrounding structures were obtained without and with intravenous contrast. CONTRAST:  9mL GADAVIST GADOBUTROL 1 MMOL/ML IV SOLN COMPARISON:  None Available. FINDINGS: Brain: No acute infarct, mass effect or extra-axial collection. No acute or chronic hemorrhage. There is multifocal hyperintense T2-weighted signal within the white matter. Parenchymal volume and CSF spaces are normal. The midline structures are normal. There is no abnormal contrast enhancement. Vascular: Major flow voids are preserved. Skull and upper cervical spine: Normal calvarium and skull base. Visualized upper  cervical spine and soft tissues are normal. Sinuses/Orbits:Opacification of the right maxillary and right anterior ethmoid sinuses. Normal orbits. IMPRESSION: 1. No acute intracranial abnormality. 2. Multifocal hyperintense T2-weighted signal within the white matter, most consistent with chronic small vessel ischemia. 3. Polypoid lesion of the right maxillary and ethmoid sinuses, possibly antrochoanal polyp. Nonemergent ENT consultation might be helpful. Electronically Signed   By: Deatra Robinson M.D.   On: 07/30/2022 20:34   CT ABDOMEN PELVIS WO CONTRAST  Result Date: 07/29/2022 CLINICAL DATA:  Acute abdominal pain. EXAM: CT ABDOMEN AND PELVIS WITHOUT CONTRAST TECHNIQUE: Multidetector CT imaging of the abdomen and pelvis was performed following the standard protocol without IV contrast. RADIATION DOSE REDUCTION: This exam was performed according to the departmental dose-optimization program which includes automated exposure control, adjustment of the mA and/or kV according to patient size and/or use of iterative reconstruction technique. COMPARISON:  CT examination dated Jul 15, 2022 FINDINGS: Lower chest: Bibasilar subsegmental atelectasis. Hepatobiliary: No focal liver abnormality is seen. No gallstones, gallbladder wall thickening, or biliary dilatation. Pancreas: Mild generalized pancreatic atrophy. No pancreatic ductal dilatation or surrounding inflammatory changes. Spleen: Normal in size without focal abnormality. Adrenals/Urinary Tract: Adrenal glands are unremarkable. Kidneys are normal, without renal calculi, focal lesion, or hydronephrosis. Bilateral exophytic renal cysts, likely simple cysts. Bladder is unremarkable. Stomach/Bowel: Stomach is within normal limits. Appendix appears normal. Scattered colonic diverticulosis without evidence of acute diverticulitis. No evidence of bowel wall thickening, distention, or inflammatory changes. Vascular/Lymphatic: No significant vascular findings are present.  There numerous lymph nodes of multiple sizes which are para-aortic, mesenteric, porta hepatis as well as multiple lymph nodes about the spleen as well as few lymph nodes in the pelvis. These are not significantly changed since prior examination. Reproductive: Postsurgical changes in the prostate bed. Other: Scattered soft tissue densities in the anterior abdominal wall, which may represent lymph nodes. No abdominopelvic ascites. Musculoskeletal: Compression fracture of the L4 vertebral body is unchanged. No acute osseous abnormality. IMPRESSION: 1. No CT evidence of acute  abdominal/pelvic process. 2. Scattered colonic diverticulosis without evidence of acute diverticulitis. 3. Numerous lymph nodes of multiple sizes are present throughout the abdomen and pelvis predominantly in the mesentery and para-aortic region, which are not significantly changed since prior examination. 4. Chronic compression fracture of the L4 vertebral body. Electronically Signed   By: Larose Hires D.O.   On: 07/29/2022 19:26   CT Head Wo Contrast  Result Date: 07/29/2022 CLINICAL DATA:  Seizures, new onset. EXAM: CT HEAD WITHOUT CONTRAST TECHNIQUE: Contiguous axial images were obtained from the base of the skull through the vertex without intravenous contrast. RADIATION DOSE REDUCTION: This exam was performed according to the departmental dose-optimization program which includes automated exposure control, adjustment of the mA and/or kV according to patient size and/or use of iterative reconstruction technique. COMPARISON:  None Available. FINDINGS: Brain: No evidence of acute infarction, hemorrhage, hydrocephalus, extra-axial collection or mass lesion/mass effect. Vascular: No hyperdense vessel or unexpected calcification. Skull: Normal. Negative for fracture or focal lesion. Sinuses/Orbits: Mucosal thickening of the ethmoid air cells. Other: None. IMPRESSION: 1. No acute intracranial abnormality. 2. Mucosal thickening of the ethmoid air  cells. Electronically Signed   By: Larose Hires D.O.   On: 07/29/2022 19:15   ECHOCARDIOGRAM COMPLETE  Result Date: 07/29/2022    ECHOCARDIOGRAM REPORT   Patient Name:   Terran Lawrie Date of Exam: 07/29/2022 Medical Rec #:  [id number]    Height:       67.0 in Accession #:    [id number]   Weight:       185.9 lb Date of Birth:  1950-12-23    BSA:          1.960 m Patient Age:    72 years     BP:           111/73 mmHg Patient Gender: M            HR:           117 bpm. Exam Location:  Outpatient Procedure: 2D Echo, Color Doppler and Cardiac Doppler Indications:    Chemo  History:        Patient has prior history of Echocardiogram examinations, most                 recent 12/16/2021. Risk Factors:Hypertension. Prostate Cancer,                 Lymphoma, Chemotherapy.  Sonographer:    Milbert Coulter Referring Phys: Johney Maine  Sonographer Comments: Image acquisition challenging due to respiratory motion and Tachycardia. IMPRESSIONS  1. Intracavitary gradient is realted to dynamic function. Left ventricular ejection fraction, by estimation, is >75%. The left ventricle has hyperdynamic function. The left ventricle has no regional wall motion abnormalities. There is mild concentric left ventricular hypertrophy. Left ventricular diastolic parameters were normal.  2. Right ventricular systolic function is hyperdynamic. The right ventricular size is normal. Tricuspid regurgitation signal is inadequate for assessing PA pressure.  3. The mitral valve is normal in structure. No evidence of mitral valve regurgitation.  4. The aortic valve is tricuspid. There is mild calcification of the aortic valve. Aortic valve regurgitation is not visualized. No aortic stenosis is present. Comparison(s): No significant change from prior study. FINDINGS  Left Ventricle: Intracavitary gradient is realted to dynamic function. Left ventricular ejection fraction, by estimation, is >75%. The left ventricle has hyperdynamic function. The left  ventricle has no regional wall motion abnormalities. The left ventricular internal cavity size was small. There is mild  concentric left ventricular hypertrophy. Left ventricular diastolic parameters were normal. Right Ventricle: The right ventricular size is normal. No increase in right ventricular wall thickness. Right ventricular systolic function is hyperdynamic. Tricuspid regurgitation signal is inadequate for assessing PA pressure. Left Atrium: Left atrial size was normal in size. Right Atrium: Right atrial size was normal in size. Pericardium: Trivial pericardial effusion is present. The pericardial effusion is posterior to the left ventricle. Mitral Valve: The mitral valve is normal in structure. No evidence of mitral valve regurgitation. Tricuspid Valve: The tricuspid valve is normal in structure. Tricuspid valve regurgitation is not demonstrated. Aortic Valve: The aortic valve is tricuspid. There is mild calcification of the aortic valve. Aortic valve regurgitation is not visualized. No aortic stenosis is present. Aortic valve mean gradient measures 10.0 mmHg. Aortic valve peak gradient measures 18.5 mmHg. Aortic valve area, by VTI measures 2.53 cm. Pulmonic Valve: The pulmonic valve was not well visualized. Pulmonic valve regurgitation is not visualized. Aorta: The aortic root and ascending aorta are structurally normal, with no evidence of dilitation. IAS/Shunts: The interatrial septum was not well visualized.  LEFT VENTRICLE PLAX 2D LVIDd:         4.00 cm     Diastology LVIDs:         1.80 cm     LV e' medial:    8.24 cm/s LV PW:         1.10 cm     LV E/e' medial:  7.5 LV IVS:        1.20 cm     LV e' lateral:   11.70 cm/s LVOT diam:     2.10 cm     LV E/e' lateral: 5.3 LV SV:         72 LV SV Index:   37 LVOT Area:     3.46 cm  LV Volumes (MOD) LV vol d, MOD A2C: 27.2 ml LV vol d, MOD A4C: 49.7 ml LV vol s, MOD A2C: 7.1 ml LV vol s, MOD A4C: 13.6 ml LV SV MOD A2C:     20.1 ml LV SV MOD A4C:     49.7  ml LV SV MOD BP:      26.5 ml RIGHT VENTRICLE TAPSE (M-mode): 2.0 cm LEFT ATRIUM             Index LA diam:        3.50 cm 1.79 cm/m LA Vol (A2C):   26.6 ml 13.57 ml/m LA Vol (A4C):   36.9 ml 18.82 ml/m LA Biplane Vol: 31.3 ml 15.97 ml/m  AORTIC VALVE AV Area (Vmax):    2.77 cm AV Area (Vmean):   2.66 cm AV Area (VTI):     2.53 cm AV Vmax:           215.00 cm/s AV Vmean:          146.000 cm/s AV VTI:            0.285 m AV Peak Grad:      18.5 mmHg AV Mean Grad:      10.0 mmHg LVOT Vmax:         172.00 cm/s LVOT Vmean:        112.000 cm/s LVOT VTI:          0.208 m LVOT/AV VTI ratio: 0.73  AORTA Ao Asc diam: 2.90 cm MITRAL VALVE MV Area (PHT): 4.49 cm    SHUNTS MV Decel Time: 169 msec    Systemic VTI:  0.21 m  MV E velocity: 61.90 cm/s  Systemic Diam: 2.10 cm MV A velocity: 89.20 cm/s MV E/A ratio:  0.69 Riley Lam MD Electronically signed by Riley Lam MD Signature Date/Time: 07/29/2022/4:01:01 PM    Final    DG Chest Port 1 View  Result Date: 07/29/2022 CLINICAL DATA:  Cough.  Syncope. EXAM: PORTABLE CHEST 1 VIEW COMPARISON:  May 23, 2022.  Jul 07, 2022. FINDINGS: Prominence of the superior mediastinum is noted consistent with adenopathy as noted on prior CT scan. Stable cardiac size. Lungs are clear. Bony thorax is unremarkable. IMPRESSION: Widening of superior mediastinum consistent with adenopathy noted on prior CT scan of Jul 07, 2022. This is concerning for lymphoma or metastatic disease. Electronically Signed   By: Lupita Raider M.D.   On: 07/29/2022 15:34   [place] CORE BIOPSY (LYMPH NODES)  Result Date: 07/16/2022 INDICATION: 72 year old male referred for biopsy of axillary lymph node EXAM: ULTRASOUND-GUIDED BIOPSY AXILLARY LYMPH NODE MEDICATIONS: None. ANESTHESIA/SEDATION: None COMPLICATIONS: None PROCEDURE: Informed written consent was obtained from the patient with interpreter after a thorough discussion of the procedural risks, benefits and alternatives. All questions were  addressed. Maximal Sterile Barrier Technique was utilized including caps, mask, sterile gowns, sterile gloves, sterile drape, hand hygiene and skin antiseptic. A timeout was performed prior to the initiation of the procedure. Ultrasound survey was performed with images stored and sent to PACs. The left axillary region was prepped with chlorhexidine in a sterile fashion, and a sterile drape was applied covering the operative field. A sterile gown and sterile gloves were used for the procedure. Local anesthesia was provided with 1% Lidocaine. Ultrasound guidance was used to infiltrate the region with 1% lidocaine for local anesthesia. Four separate 16 gauge core needle biopsy were then acquired of the left axillary lymph node using ultrasound guidance. Images were stored. Final image was stored after biopsy. Patient tolerated the procedure well and remained hemodynamically stable throughout. No complications were encountered and no significant blood loss was encounter IMPRESSION: Status post ultrasound-guided biopsy of left axillary lymph node Signed, Yvone Neu. Miachel Roux, RPVI [facility] Radiology Electronically Signed   By: Gilmer Mor D.O.   On: 07/16/2022 17:07   CT ABDOMEN PELVIS W CONTRAST  Result Date: 07/15/2022 CLINICAL DATA:  Possible lymphoma. Bowel obstruction suspected. Right lower quadrant and right upper quadrant abdominal pain for 6 weeks. Nausea and vomiting. Scheduled for a lymph node biopsy tomorrow. EXAM: CT ABDOMEN AND PELVIS WITH CONTRAST TECHNIQUE: Multidetector CT imaging of the abdomen and pelvis was performed using the standard protocol following bolus administration of intravenous contrast. RADIATION DOSE REDUCTION: This exam was performed according to the departmental dose-optimization program which includes automated exposure control, adjustment of the mA and/or kV according to patient size and/or use of iterative reconstruction  technique. CONTRAST:  75mL OMNIPAQUE IOHEXOL 350 MG/ML SOLN COMPARISON:  CT abdomen and pelvis 06/29/2022 FINDINGS: Lower chest: Mild curvilinear subsegmental atelectasis and/or scarring within the bilateral lung bases is similar to prior. Heart size is at the upper limits of normal. Coronary artery calcifications are noted. Hepatobiliary: Smooth liver contours. There are again punctate subcentimeter low-density lesions within the right hepatic lobe (axial series 3, image 25) and the left hepatic lobe (axial image 23) that are again too small to further characterize but not significantly changed from recent prior. The gallbladder is unremarkable. No intrahepatic or extrahepatic biliary ductal dilatation. Pancreas: Unremarkable. No pancreatic ductal dilatation or surrounding inflammatory changes. Spleen: The spleen measures 11  cm in length, within normal limits. There is a 2.9 x 2.6 x 2.6 cm low-density lesion within the medial aspect of the spleen that is unchanged from 07/07/2022 recent CT but new from 12/10/2021. Adrenals/Urinary Tract: Normal bilateral adrenals. The kidneys enhance uniformly and are symmetric in size. There are again bilateral fluid attenuation renal cysts, measuring up to 3.8 cm on the right and 2.8 cm on the left. No follow-up imaging is recommended. The urinary bladder is decompressed, limiting evaluation. Stomach/Bowel: Moderate amount of stool throughout the colon. Normal appearance of the terminal ileum. Normal appendix (axial series 3, image 60) no dilated loops of bowel are seen to indicate bowel obstruction. Vascular/Lymphatic: No abdominal aortic aneurysm. The major intra-abdominal aortic branch vessels are patent. No significant change in mesenteric, porta hepatis, retroperitoneal, and pelvic lymphadenopathy compared to 07/07/2022, again new from 12/10/2021. Reference gastrohepatic ligament 2.0 cm short axis lymph node (axial series 2, image 21), lymph node at the anterior aspect of  the splenic hilum measuring up to 2.0 cm in short axis (axial image 25), and conglomeration of aortocaval and para-aortic lymph nodes with a focal conglomeration measuring up to 2.8 cm in short axis (axial image 42), not significantly changed from 07/07/2022. Reproductive: The prostate again appears to be surgically absent. Other: No ventral abdominal hernia is seen. No free air or free fluid is seen within the abdomen or pelvis. Musculoskeletal: Chronic inferior L4 endplate mild concavity degenerative Schmorl's node is similar to prior. Anterior bridging osteophytes are again seen throughout the visualized inferior thoracic spine. There is complete anterior osseous fusion of the left sacroiliac joint. Moderate anterior right sacroiliac joint degenerative osteophytes. IMPRESSION: Compared to 07/07/2022: 1. No bowel obstruction.  Normal appendix. 2. No significant change in mesenteric, porta hepatis, retroperitoneal, and pelvic lymphadenopathy compared to 07/07/2022, again new from 12/10/2021. 3. No significant change in 2.9 cm low-density lesion within the medial aspect of the spleen compared to 07/07/2022 but new from 12/10/2021. 4. Constellation of findings are again concerning for lymphoma versus metastatic disease. Electronically Signed   By: Neita Garnet M.D.   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 Typically would have done daEPOCH-R but with his r/o MDS high risk of cytopenias esp with etoposide Status post mini CHOP on 08/06/2022.   Expect nadir in  blood counts 10 to 12 days out. Rituxan was held due to significant tumor lysis and multifactorial AKI.  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  # 7 Anemia and mild thrombocytopenia -- likely multifactorial -- lymphoma + MDS+ >?sepsis r/o GI bleeding.  And now due to his mini CHOP chemotherapy.  #8 Abdominal distention with partial obstruction versus ileus.  # 9 Hep B c AB +ve, Hep B SAg neg HepB SAb neg, HBV DNA PCR --neg.  #10 acute kidney injury with sodium level less than 20 possibly related to prerenal causes versus contrast nephropathy versus high risk of tumor lysis syndrome. Renal ultrasound with no obstruction Received 1 dose of rasburicase on  6/12 and 1 dose of rasburicase 6 mg on 6/13. Currently on CRRT.  11 neutropenia from chemotherapy-continues to be on G-CSF  PLAN  -Discussed lab results on 08/14/2022 in detail with patient. CBC showed WBC of 0.5K, hemoglobin of 7.9, and platelets of 28K. -cbc continues to be low -Patient is nearing his blood counts which should be at their lowest between day 10 and day 12 post chemotherapy and will hopefully start recovering. -Continue Granix 480 mcg daily till [facility] is more than 1000 on 2 successive days -Transfuse PRBC as needed to maintain hemoglobin more than 7.5 -Transfuse platelets as needed to maintain platelets more than 20k -Optimize nutritional status per NG feeding -Appreciate excellent care by pulm and critical care medicine team -Appreciate nephrology input and help with CRRT. -Continue allopurinol for TLS prophylaxis through the first cycle. -proceed with Rituxan antibody treatment today -continues to require blood transfusions -Ongoing goals of care discussion based on recovery from cycle 1 of treatment.  The treatment is expected to be quite effective in shrinking his lymphoma but the intensity of treatment might be limited based on his other medical issues, blood counts, renal function, nutritional  status etc.  The total time spent in the appointment was *** minutes* .  All of the patient's questions were answered with apparent satisfaction. The patient knows to call the clinic with any problems, questions or concerns.   Wyvonnia Lora MD MS AAHIVMS [facility] Hematology/Oncology Physician [facility]  .*Total Encounter Time as defined by the Centers for Medicare and Medicaid Services includes, in addition to the face-to-face time of a patient visit (documented in the note above) non-face-to-face time: obtaining and reviewing outside history, ordering and reviewing medications, tests or procedures, care coordination (communications with other health care professionals or caregivers) and documentation in the medical record.    I,Mitra Faeizi,acting as a Neurosurgeon for No name on file.,have documented all relevant documentation on the behalf of No name on file,as directed by  No name on file while in the presence of No name on file.  ***

## 2022-08-14 NOTE — TOC Progression Note (Signed)
Transition of Care [facility]) - Progression Note    Patient Details  Name: Tyler [name] MRN: [id number] Date of Birth: 1950/07/04  Transition of Care [facility]) CM/SW Contact  Beckie Busing, RN Phone [phone]  08/14/2022, 11:07 AM  Clinical Narrative:    TOC continues to follow for disposition needs. CM requested LTACH review to determine if this is an option. Patient is on  CRRT. LTACH can not  provide CRRT. Also, a cancer pt and LTACH does not have oncology services. TOC will continue to follow for disposition needs.    Expected Discharge Plan: Home/Self Care Barriers to Discharge: Continued Medical Work up  Expected Discharge Plan and Services                                               Social Determinants of Health (SDOH) Interventions SDOH Screenings   Food Insecurity: No Food Insecurity (07/30/2022)  Housing: Low Risk  (07/30/2022)  Transportation Needs: No Transportation Needs (07/30/2022)  Utilities: Not At Risk (07/30/2022)  Tobacco Use: Low Risk  (08/08/2022)    Readmission Risk Interventions    07/30/2022    2:44 PM  Readmission Risk Prevention Plan  Transportation Screening Complete  PCP or Specialist Appt within 3-5 Days Complete  HRI or Home Care Consult Complete  Social Work Consult for Recovery Care Planning/Counseling Complete  Palliative Care Screening Not Applicable  Medication Review Oceanographer) Complete

## 2022-08-14 NOTE — Plan of Care (Signed)
  Problem: Education: Goal: Knowledge of General Education information will improve Description: Including pain rating scale, medication(s)/side effects and non-pharmacologic comfort measures Outcome: Progressing   Problem: Health Behavior/Discharge Planning: Goal: Ability to manage health-related needs will improve Outcome: Progressing   Problem: Clinical Measurements: Goal: Ability to maintain clinical measurements within normal limits will improve Outcome: Progressing Goal: Will remain free from infection Outcome: Progressing Goal: Diagnostic test results will improve Outcome: Progressing Goal: Respiratory complications will improve Outcome: Progressing Goal: Cardiovascular complication will be avoided Outcome: Progressing   Problem: Activity: Goal: Risk for activity intolerance will decrease Outcome: Progressing   Problem: Nutrition: Goal: Adequate nutrition will be maintained Outcome: Progressing   Problem: Coping: Goal: Level of anxiety will decrease Outcome: Progressing   Problem: Elimination: Goal: Will not experience complications related to bowel motility Outcome: Progressing Goal: Will not experience complications related to urinary retention Outcome: Progressing   Problem: Pain Managment: Goal: General experience of comfort will improve Outcome: Progressing   Problem: Safety: Goal: Ability to remain free from injury will improve Outcome: Progressing   Problem: Skin Integrity: Goal: Risk for impaired skin integrity will decrease Outcome: Progressing   Problem: Fluid Volume: Goal: Hemodynamic stability will improve Outcome: Progressing   Problem: Clinical Measurements: Goal: Diagnostic test results will improve Outcome: Progressing Goal: Signs and symptoms of infection will decrease Outcome: Progressing   Problem: Respiratory: Goal: Ability to maintain adequate ventilation will improve Outcome: Progressing  Problem: Activity: Goal: Ability  to tolerate increased activity will improve Outcome: Completed/Met   Problem: Respiratory: Goal: Ability to maintain a clear airway and adequate ventilation will improve Outcome: Completed/Met   Problem: Role Relationship: Goal: Method of communication will improve Outcome: Completed/Met   Problem: Safety: Goal: Non-violent Restraint(s) Outcome: Completed/Met   Cindy S. Clelia Croft BSN, RN, CCRP, CCRN 08/14/2022 1:35 AM

## 2022-08-14 NOTE — Progress Notes (Signed)
Patient completed Rituximab titration without any issues. VS remained stable during and after infusion. See flowsheet and MAR. Reported to primary RN.

## 2022-08-14 NOTE — Progress Notes (Signed)
NAME:  Tyler [name], MRN:  [id number], DOB:  [birth year], LOS: 16 ADMISSION DATE:  07/29/2022 CONSULTATION DATE:  08/08/2022 REFERRING MD:  Hanley Ben - [facility] CHIEF COMPLAINT: Sepsis, ARF with need for RRT   History of Present Illness:  72 yo male with low grade myelodysplastic syndrome found to have generalized lymphadenopathy from high grade large B cell lymphoma. Plan to start on R-CHOP. Came to ER with near syncope. He had fever and hypotension with elevated lactic acid level. Started on ABx. He had seizure like activity, and Rt facial paresthesia. He developed renal failure. Seen by cardiology for PAF. Had PICC placed and started on R mini CHOP on 08/05/22. Renal fx got worse and nephrology consulted. Had elevated uric acid with concern for tumor lysis syndrome and received rasburicase. Rituxan held because of this. Renal fx and mental status got worse. Concern for aspiration pneumonitis. Nephrology recommending to start CRRT. PCCM consulted.   Pertinent Medical History:  Prostate cancer, MDS, B cell lymphoma, PE, HTN, PAF, Recurrent UTI  Significant Hospital Events: Including procedures, antibiotic start and stop dates in addition to other pertinent events   6/03 Presented to ED for fever, near-syncope. Presumed sepsis. Admitted to [facility]. 6/13 Worsening acute renal failure as evidenced by rising BUN/Cr, hyperkalemia to 5.6. PCCM consulted for ARF/need for RRT, line placement. Intubated. R IJ Trialysis catheter placed. CRRT started. 6/15 transfuse 1 unit PRBC 6/16 extubate 6/17 no acute events overnight, remains on CRRT 6/18 no acute events overnight, remains on CRRT  Interim History / Subjective:  Remains on CRRT, alert and conversant  Objective:  Blood pressure (!) 119/59, pulse 92, temperature 99 F (37.2 C), temperature source Oral, resp. rate 17, height 5\' 7"  (1.702 m), weight 79 kg, SpO2 99 %. CVP:  [2 mmHg-6 mmHg] 4 mmHg      Intake/Output Summary (Last 24 hours) at 08/14/2022 4098 Last  data filed at 08/14/2022 0900 Gross per 24 hour  Intake 2003 ml  Output 2946 ml  Net -943 ml    Filed Weights   08/12/22 0500 08/13/22 0500 08/14/22 0500  Weight: 79.7 kg 79.3 kg 79 kg   Physical Examination: General: Acute on chronic ill-appearing elderly male lying in bed in no acute distress HEENT: [place]/AT, MM pink/moist, PERRL,  Neuro: Alert and interactive, engages in voncersation CV: s1s2 regular rate and rhythm, no murmur, rubs, or gallops,  PULM: Diminished bilaterally, no increased work of breathing, on RA GI: soft, bowel sounds active in all 4 quadrants, non-tender, non-distended, tolerating tube feeds Extremities: warm/dry, no edema  Skin: no rashes or lesions  Resolved Hospital Problem List:  Acute hypoxic respiratory failure with compromised airway  -Extubated 6/16 Hyperkalemia  Assessment & Plan:   Acute renal failure: Suspect multifactorial related to possible sepsis as well as contribution of tumor lysis syndrome given elevated uric acid etc. -- Status post rasburicase x 2 -- Dialysis per nephrology, normotensive, continuing CRRT at [facility] long, after discussion with oncology and neprhology, plan to continue CRRT until after dose of rituximab (date TBD) then consideration of transition to iHD per nephrology   Encephalopathy: Likely multifactorial related to improving uremia, delirium, sepsis. --improving to resolved   Acute hypoxemic respiratory failure due to aspiration: -- Extubated 6/16 -- Continue antibiotics, zosyn 5 days end date 6/18 -- Goal O2 sat 88% or greater, wean oxygen as tolerated   Paroxysmal A-fib: No AC due to history of bleeding -- Continue amiodarone   Pancytopenia: -- Transfuse hemoglobin greater than 7, platelets greater than  20 -- Granix per hematology/oncology   Tumor lysis syndrome: -- Continue allopurinol   Best Practice: (right click and "Reselect all SmartList Selections" daily)   Diet/type: tubefeeds DVT prophylaxis:  SCDs GI prophylaxis: PPI Lines: Central line and Dialysis Catheter Foley:  Yes, and it is still needed Code Status:  full code Last date of multidisciplinary goals of care discussion [updated his daughter, Karena Addison, by phone on 6/16]  Critical care time:   CRITICAL CARE Performed by: Lesia Sago Ved Martos  Total critical care time: 31 minutes  Critical care time was exclusive of separately billable procedures and treating other patients.  Critical care was necessary to treat or prevent imminent or life-threatening deterioration.  Critical care was time spent personally by me on the following activities: development of treatment plan with patient and/or surrogate as well as nursing, discussions with consultants, evaluation of patient's response to treatment, examination of patient, obtaining history from patient or surrogate, ordering and performing treatments and interventions, ordering and review of laboratory studies, ordering and review of radiographic studies, pulse oximetry and re-evaluation of patient's condition.  Karren Burly, MD [facility] Personal contact information can be found on Amion  If no contact or response made please call [phone] 08/14/2022, 9:09 AM

## 2022-08-15 ENCOUNTER — Encounter: Payer: Self-pay | Admitting: Hematology

## 2022-08-15 DIAGNOSIS — A419 Sepsis, unspecified organism: Secondary | ICD-10-CM | POA: Diagnosis not present

## 2022-08-15 DIAGNOSIS — R6520 Severe sepsis without septic shock: Secondary | ICD-10-CM | POA: Diagnosis not present

## 2022-08-15 LAB — RENAL FUNCTION PANEL
Albumin: 1.8 g/dL — ABNORMAL LOW (ref 3.5–5.0)
Albumin: 1.8 g/dL — ABNORMAL LOW (ref 3.5–5.0)
Anion gap: 5 (ref 5–15)
Anion gap: 3 — ABNORMAL LOW (ref 5–15)
BUN: 34 mg/dL — ABNORMAL HIGH (ref 8–23)
BUN: 37 mg/dL — ABNORMAL HIGH (ref 8–23)
CO2: 25 mmol/L (ref 22–32)
CO2: 26 mmol/L (ref 22–32)
Calcium: 7.2 mg/dL — ABNORMAL LOW (ref 8.9–10.3)
Calcium: 7.2 mg/dL — ABNORMAL LOW (ref 8.9–10.3)
Chloride: 105 mmol/L (ref 98–111)
Chloride: 106 mmol/L (ref 98–111)
Creatinine, Ser: 1.55 mg/dL — ABNORMAL HIGH (ref 0.61–1.24)
Creatinine, Ser: 1.65 mg/dL — ABNORMAL HIGH (ref 0.61–1.24)
GFR, Estimated: 48 mL/min — ABNORMAL LOW (ref >60)
GFR, Estimated: 44 mL/min — ABNORMAL LOW (ref >60)
Glucose, Bld: 163 mg/dL — ABNORMAL HIGH (ref 70–99)
Glucose, Bld: 144 mg/dL — ABNORMAL HIGH (ref 70–99)
Phosphorus: 3 mg/dL (ref 2.5–4.6)
Phosphorus: 2.3 mg/dL — ABNORMAL LOW (ref 2.5–4.6)
Potassium: 4.3 mmol/L (ref 3.5–5.1)
Potassium: 4.4 mmol/L (ref 3.5–5.1)
Sodium: 135 mmol/L (ref 135–145)
Sodium: 135 mmol/L (ref 135–145)

## 2022-08-15 LAB — CBC WITH DIFFERENTIAL/PLATELET
Abs Immature Granulocytes: 0 10*3/uL (ref 0.00–0.07)
Basophils Absolute: 0 10*3/uL (ref 0.0–0.1)
Basophils Relative: 0 %
Eosinophils Absolute: 0 10*3/uL (ref 0.0–0.5)
Eosinophils Relative: 4 %
HCT: 20.3 % — ABNORMAL LOW (ref 39.0–52.0)
Hemoglobin: 6.3 g/dL — CL (ref 13.0–17.0)
Immature Granulocytes: 0 %
Lymphocytes Relative: 51 %
Lymphs Abs: 0.4 10*3/uL — ABNORMAL LOW (ref 0.7–4.0)
MCH: 27.3 pg (ref 26.0–34.0)
MCHC: 31 g/dL (ref 30.0–36.0)
MCV: 87.9 fL (ref 80.0–100.0)
Monocytes Absolute: 0.1 10*3/uL (ref 0.1–1.0)
Monocytes Relative: 19 %
Neutro Abs: 0.2 10*3/uL — CL (ref 1.7–7.7)
Neutrophils Relative %: 26 %
Platelets: 42 10*3/uL — ABNORMAL LOW (ref 150–400)
RBC: 2.31 MIL/uL — ABNORMAL LOW (ref 4.22–5.81)
RDW: 15.9 % — ABNORMAL HIGH (ref 11.5–15.5)
WBC: 0.7 10*3/uL — CL (ref 4.0–10.5)
nRBC: 0 % (ref 0.0–0.2)

## 2022-08-15 LAB — TYPE AND SCREEN
ABO/RH(D): O POS
Unit division: 0

## 2022-08-15 LAB — HEMOGLOBIN AND HEMATOCRIT, BLOOD
HCT: 24.1 % — ABNORMAL LOW (ref 39.0–52.0)
Hemoglobin: 7.6 g/dL — ABNORMAL LOW (ref 13.0–17.0)

## 2022-08-15 LAB — BPAM RBC

## 2022-08-15 LAB — BASIC METABOLIC PANEL
Anion gap: 8 (ref 5–15)
BUN: 52 mg/dL — ABNORMAL HIGH (ref 8–23)
CO2: 25 mmol/L (ref 22–32)
Calcium: 7.4 mg/dL — ABNORMAL LOW (ref 8.9–10.3)
Chloride: 103 mmol/L (ref 98–111)
Creatinine, Ser: 2.26 mg/dL — ABNORMAL HIGH (ref 0.61–1.24)
GFR, Estimated: 30 mL/min — ABNORMAL LOW (ref >60)
Glucose, Bld: 138 mg/dL — ABNORMAL HIGH (ref 70–99)
Potassium: 4.8 mmol/L (ref 3.5–5.1)
Sodium: 136 mmol/L (ref 135–145)

## 2022-08-15 LAB — PREPARE RBC (CROSSMATCH)

## 2022-08-15 LAB — GLUCOSE, CAPILLARY
Glucose-Capillary: 112 mg/dL — ABNORMAL HIGH (ref 70–99)
Glucose-Capillary: 135 mg/dL — ABNORMAL HIGH (ref 70–99)
Glucose-Capillary: 138 mg/dL — ABNORMAL HIGH (ref 70–99)
Glucose-Capillary: 138 mg/dL — ABNORMAL HIGH (ref 70–99)
Glucose-Capillary: 142 mg/dL — ABNORMAL HIGH (ref 70–99)
Glucose-Capillary: 149 mg/dL — ABNORMAL HIGH (ref 70–99)
Glucose-Capillary: 149 mg/dL — ABNORMAL HIGH (ref 70–99)

## 2022-08-15 LAB — MAGNESIUM: Magnesium: 2.3 mg/dL (ref 1.7–2.4)

## 2022-08-15 LAB — LACTATE DEHYDROGENASE: LDH: 330 U/L — ABNORMAL HIGH (ref 98–192)

## 2022-08-15 LAB — URIC ACID: Uric Acid, Serum: <1.5 mg/dL — ABNORMAL LOW (ref 3.7–8.6)

## 2022-08-15 MED ORDER — ALTEPLASE 2 MG IJ SOLR: 2.0000 mg | Freq: Once | INTRAMUSCULAR | Status: AC

## 2022-08-15 MED ORDER — ALTEPLASE 2 MG IJ SOLR
INTRAMUSCULAR | Status: AC
  Administered 2022-08-15: 2 mg
  Filled 2022-08-15: qty 2

## 2022-08-15 MED ORDER — TBO-FILGRASTIM 480 MCG/0.8ML ~~LOC~~ SOSY
480.0000 ug | PREFILLED_SYRINGE | Freq: Every day | SUBCUTANEOUS | Status: DC
  Administered 2022-08-15 – 2022-08-17 (×3): 480 ug via SUBCUTANEOUS
  Filled 2022-08-15 (×4): qty 0.8

## 2022-08-15 MED ORDER — STERILE WATER FOR INJECTION IJ SOLN
INTRAMUSCULAR | Status: AC
  Filled 2022-08-15: qty 10

## 2022-08-15 MED ORDER — SODIUM CHLORIDE 0.9% IV SOLUTION: Freq: Once | INTRAVENOUS | Status: DC

## 2022-08-15 MED ORDER — POLYETHYLENE GLYCOL 3350 17 G PO PACK
17.0000 g | PACK | Freq: Every day | ORAL | Status: DC | PRN
  Administered 2022-08-19: 17 g
  Filled 2022-08-15: qty 1

## 2022-08-15 MED ORDER — DOCUSATE SODIUM 50 MG/5ML PO LIQD: 100.0000 mg | Freq: Two times a day (BID) | ORAL | Status: DC | PRN

## 2022-08-15 MED ORDER — SENNA 8.6 MG PO TABS: 2.0000 | ORAL_TABLET | Freq: Every day | ORAL | Status: DC | PRN

## 2022-08-15 NOTE — Progress Notes (Signed)
[facility] Progress Note   Assessment/ Plan:   AKI - b/l creatinine 0.6- 0.9 here on admission. Creat increased up to 1.3 on 6/10 and 2.3 on 6/11.  UA showed ^wbc, min rbc's/ protein. Renal US showed no obstruction. 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 Uric acid/ hyperkalemia peaked and now have recovered w/ CRRT and rasburicase. CRRT started 6/13. No signs of renal recovery, cont CRRT.    - will remain on CRRT until it is determined if pt is having a recurrence of TLS- uric acid is < 1.5 today- LDH pending- will pause CRRT today at 1700  - all 4K   - net neg 50 mL/ hr Tumor lysis syndrome - sp rasburicase 6mg  on 6/12 and another 6mg  on 6/13. UA down to 2- 3 range. Per ONC.  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 Ritux 6/19 Parox atrial fib- on amiodarone  Subjective:    Seen in room. No issues other than feeling thirsty   Objective:   BP 124/76   Pulse 100   Temp 99 F (37.2 C) (Oral)   Resp (!) 23   Ht 5\' 7"  (1.702 m)   Wt 78.1 kg   SpO2 99%   BMI 26.97 kg/m   Physical Exam: Gen:NAD, lying in bed CVS: RRR Resp: clear Abd: soft Ext: trace LE edema ACCESS: R internal jugular nontunneled HD catheter  Labs: BMET Recent Labs  Lab 08/12/22 0515 08/12/22 1400 08/13/22 0300 08/13/22 1629 08/14/22 0425 08/14/22 1714 08/15/22 0504  NA 137 136 137 136 136 135 135  K 3.3* 3.7 3.7 3.9 4.0 4.3 4.3  CL 101 102 104 105 104 105 105  CO2 28 23 28 26 26 25 25   GLUCOSE 114* 106* 107* 140* 144* 155* 163*  BUN 41* 33* 34* 29* 31* 34* 34*  CREATININE 1.44* 1.46* 1.36* 1.39* 1.47* 1.60* 1.55*  CALCIUM 7.5* 7.4* 7.5* 7.5* 7.5* 7.4* 7.2*  PHOS 3.1 2.9 2.9 2.1* 1.7* 1.5* 3.0   CBC Recent Labs  Lab 08/11/22 0316 08/12/22 1400 08/13/22 0300 08/14/22 0425  WBC 7.0 1.5* 0.8* 0.5*  NEUTROABS 4.3 0.9* 0.3* 0.1*  HGB 7.9* 6.2* 7.9* 7.9*  HCT 24.2*  20.1* 24.8* 25.3*  MCV 84.9 89.7 87.0 88.2  PLT 20* 9* 34* 28*      Medications:     acetaminophen  650 mg Per Tube Once   allopurinol  100 mg Per Tube BID   amiodarone  200 mg Per Tube Daily   B-complex with vitamin C  1 tablet Per Tube Daily   Chlorhexidine Gluconate Cloth  6 each Topical Q2200   feeding supplement (PROSource TF20)  60 mL Per Tube TID   insulin aspart  0-6 Units Subcutaneous Q4H   mouth rinse  15 mL Mouth Rinse 4 times per day   pantoprazole (PROTONIX) IV  40 mg Intravenous Daily   sodium chloride flush  10-40 mL Intracatheter Q12H   sterile water (preservative free)       Tbo-filgastrim (GRANIX) SQ  480 mcg Subcutaneous q1800     Bufford Buttner, MD 08/15/2022, 12:10 PM

## 2022-08-15 NOTE — Progress Notes (Signed)
NAME:  Tyler [name], MRN:  [id number], DOB:  12-30-1950, LOS: 17 ADMISSION DATE:  07/29/2022 CONSULTATION DATE:  08/08/2022 REFERRING MD:  Hanley Ben - [facility] CHIEF COMPLAINT: Sepsis, ARF with need for RRT   History of Present Illness:  72 yo male with low grade myelodysplastic syndrome found to have generalized lymphadenopathy from high grade large B cell lymphoma. Plan to start on R-CHOP. Came to ER with near syncope. He had fever and hypotension with elevated lactic acid level. Started on ABx. He had seizure like activity, and Rt facial paresthesia. He developed renal failure. Seen by cardiology for PAF. Had PICC placed and started on R mini CHOP on 08/05/22. Renal fx got worse and nephrology consulted. Had elevated uric acid with concern for tumor lysis syndrome and received rasburicase. Rituxan held because of this. Renal fx and mental status got worse. Concern for aspiration pneumonitis. Nephrology recommending to start CRRT. PCCM consulted.   Pertinent Medical History:  Prostate cancer, MDS, B cell lymphoma, PE, HTN, PAF, Recurrent UTI  Significant Hospital Events: Including procedures, antibiotic start and stop dates in addition to other pertinent events   6/03 Presented to ED for fever, near-syncope. Presumed sepsis. Admitted to [facility]. 6/13 Worsening acute renal failure as evidenced by rising BUN/Cr, hyperkalemia to 5.6. PCCM consulted for ARF/need for RRT, line placement. Intubated. R IJ Trialysis catheter placed. CRRT started. 6/15 transfuse 1 unit PRBC 6/16 extubate 6/17 no acute events overnight, remains on CRRT 6/18 no acute events overnight, remains on CRRT 6/19 Rituximab, CRRT  Interim History / Subjective:  Remains on CRRT, alert and conversant.  Objective:  Blood pressure 123/81, pulse 100, temperature 99 F (37.2 C), temperature source Oral, resp. rate (!) 27, height 5\' 7"  (1.702 m), weight 78.1 kg, SpO2 98 %. CVP:  [2 mmHg-3 mmHg] 3 mmHg      Intake/Output Summary (Last 24 hours)  at 08/15/2022 1019 Last data filed at 08/15/2022 0900 Gross per 24 hour  Intake 2287.59 ml  Output 3088.4 ml  Net -800.81 ml    Filed Weights   08/13/22 0500 08/14/22 0500 08/15/22 0800  Weight: 79.3 kg 79 kg 78.1 kg   Physical Examination: General: Acute on chronic ill-appearing elderly male lying in bed in no acute distress HEENT: [place]/AT, MM pink/moist, PERRL,  Neuro: Alert and interactive, engages in voncersation CV: s1s2 regular rate and rhythm, no murmur, rubs, or gallops,  PULM: Diminished bilaterally, no increased work of breathing, on RA GI: soft, bowel sounds active in all 4 quadrants, non-tender, non-distended, tolerating tube feeds Extremities: warm/dry, no edema  Skin: no rashes or lesions  Resolved Hospital Problem List:  Acute hypoxic respiratory failure with compromised airway  -Extubated 6/16 Hyperkalemia  Assessment & Plan:   Acute renal failure: Suspect multifactorial related to possible sepsis as well as contribution of tumor lysis syndrome given elevated uric acid etc. -- Status post rasburicase x 2 -- Dialysis per nephrology, normotensive, continuing CRRT at [facility] long, after discussion with oncology and neprhology, plan to continue CRRT until after dose of rituximab (6/19) then consideration of transition to iHD per nephrology   Encephalopathy: Likely multifactorial related to improving uremia, delirium, sepsis. --resolved   Acute hypoxemic respiratory failure due to aspiration: Resolved - on room air. -- Extubated 6/16 -- Continue antibiotics, s/p zosyn 5 days end date 6/18 -- Goal O2 sat 88% or greater   Paroxysmal A-fib: No AC due to history of bleeding -- Continue amiodarone   Pancytopenia: -- Transfuse hemoglobin greater than 7, platelets  greater than 20 -- Granix per hematology/oncology   Tumor lysis syndrome: -- Continue allopurinol   Best Practice: (right click and "Reselect all SmartList Selections" daily)   Diet/type: tubefeeds DVT  prophylaxis: SCDs GI prophylaxis: PPI Lines: Central line and Dialysis Catheter Foley:  Yes, and it is still needed Code Status:  full code Last date of multidisciplinary goals of care discussion [updated his daughter, Karena Addison, by phone on 6/16]  Critical care time:   CRITICAL CARE Performed by: Lesia Sago Madellyn Denio  Total critical care time: 31 minutes  Critical care time was exclusive of separately billable procedures and treating other patients.  Critical care was necessary to treat or prevent imminent or life-threatening deterioration.  Critical care was time spent personally by me on the following activities: development of treatment plan with patient and/or surrogate as well as nursing, discussions with consultants, evaluation of patient's response to treatment, examination of patient, obtaining history from patient or surrogate, ordering and performing treatments and interventions, ordering and review of laboratory studies, ordering and review of radiographic studies, pulse oximetry and re-evaluation of patient's condition.  Karren Burly, MD [facility] Personal contact information can be found on Amion  If no contact or response made please call [phone] 08/15/2022, 10:19 AM

## 2022-08-15 NOTE — Progress Notes (Signed)
eLink Physician-Brief Progress Note Patient Name: Tyler [name] DOB: 02/01/1951 MRN: [id number]   Date of Service  08/15/2022  HPI/Events of Note  BMP  and H & H reviewed.  eICU Interventions  No intervention.        Migdalia Dk 08/15/2022, 11:51 PM

## 2022-08-15 NOTE — Progress Notes (Signed)
CRRT stopped at 0534 due to clotting in the filter. Filter changed and restarted at 0620.

## 2022-08-16 ENCOUNTER — Inpatient Hospital Stay (HOSPITAL_COMMUNITY): Payer: 59

## 2022-08-16 DIAGNOSIS — R6520 Severe sepsis without septic shock: Secondary | ICD-10-CM | POA: Diagnosis not present

## 2022-08-16 DIAGNOSIS — A419 Sepsis, unspecified organism: Secondary | ICD-10-CM | POA: Diagnosis not present

## 2022-08-16 LAB — BPAM RBC
Blood Product Expiration Date: 202407242359
ISSUE DATE / TIME: 202406201800
Unit Type and Rh: 5100

## 2022-08-16 LAB — GLUCOSE, CAPILLARY
Glucose-Capillary: 104 mg/dL — ABNORMAL HIGH (ref 70–99)
Glucose-Capillary: 132 mg/dL — ABNORMAL HIGH (ref 70–99)
Glucose-Capillary: 82 mg/dL (ref 70–99)
Glucose-Capillary: 86 mg/dL (ref 70–99)
Glucose-Capillary: 96 mg/dL (ref 70–99)
Glucose-Capillary: 99 mg/dL (ref 70–99)

## 2022-08-16 LAB — CBC WITH DIFFERENTIAL/PLATELET
Abs Immature Granulocytes: 0.09 10*3/uL — ABNORMAL HIGH (ref 0.00–0.07)
Basophils Absolute: 0 10*3/uL (ref 0.0–0.1)
Basophils Relative: 1 %
Eosinophils Absolute: 0 10*3/uL (ref 0.0–0.5)
Eosinophils Relative: 3 %
HCT: 23.6 % — ABNORMAL LOW (ref 39.0–52.0)
Hemoglobin: 7.6 g/dL — ABNORMAL LOW (ref 13.0–17.0)
Immature Granulocytes: 9 %
Lymphocytes Relative: 37 %
Lymphs Abs: 0.4 10*3/uL — ABNORMAL LOW (ref 0.7–4.0)
MCH: 28 pg (ref 26.0–34.0)
MCHC: 32.2 g/dL (ref 30.0–36.0)
MCV: 87.1 fL (ref 80.0–100.0)
Monocytes Absolute: 0.2 10*3/uL (ref 0.1–1.0)
Monocytes Relative: 23 %
Neutro Abs: 0.3 10*3/uL — CL (ref 1.7–7.7)
Neutrophils Relative %: 27 %
Platelets: 53 10*3/uL — ABNORMAL LOW (ref 150–400)
RBC: 2.71 MIL/uL — ABNORMAL LOW (ref 4.22–5.81)
RDW: 15.7 % — ABNORMAL HIGH (ref 11.5–15.5)
WBC: 1 10*3/uL — CL (ref 4.0–10.5)
nRBC: 0 % (ref 0.0–0.2)

## 2022-08-16 LAB — TYPE AND SCREEN: Antibody Screen: NEGATIVE

## 2022-08-16 LAB — RENAL FUNCTION PANEL
Albumin: 1.8 g/dL — ABNORMAL LOW (ref 3.5–5.0)
Anion gap: 7 (ref 5–15)
BUN: 60 mg/dL — ABNORMAL HIGH (ref 8–23)
CO2: 25 mmol/L (ref 22–32)
Calcium: 7.4 mg/dL — ABNORMAL LOW (ref 8.9–10.3)
Chloride: 101 mmol/L (ref 98–111)
Creatinine, Ser: 2.59 mg/dL — ABNORMAL HIGH (ref 0.61–1.24)
GFR, Estimated: 26 mL/min — ABNORMAL LOW (ref >60)
Glucose, Bld: 117 mg/dL — ABNORMAL HIGH (ref 70–99)
Phosphorus: 3 mg/dL (ref 2.5–4.6)
Potassium: 5 mmol/L (ref 3.5–5.1)
Sodium: 133 mmol/L — ABNORMAL LOW (ref 135–145)

## 2022-08-16 LAB — HEPATITIS B SURFACE ANTIGEN: Hepatitis B Surface Ag: NONREACTIVE

## 2022-08-16 LAB — MAGNESIUM: Magnesium: 2.4 mg/dL (ref 1.7–2.4)

## 2022-08-16 MED ORDER — SIMETHICONE 40 MG/0.6ML PO SUSP
80.0000 mg | Freq: Once | ORAL | Status: AC
  Administered 2022-08-16: 80 mg
  Filled 2022-08-16: qty 1.2

## 2022-08-16 MED ORDER — CHLORHEXIDINE GLUCONATE CLOTH 2 % EX PADS
6.0000 | MEDICATED_PAD | Freq: Every day | CUTANEOUS | Status: DC
  Administered 2022-08-17 – 2022-08-28 (×11): 6 via TOPICAL

## 2022-08-16 NOTE — Progress Notes (Signed)
NAME:  Tyler [name], MRN:  [id number], DOB:  [birth year], LOS: 18 ADMISSION DATE:  07/29/2022 CONSULTATION DATE:  08/08/2022 REFERRING MD:  Hanley Ben - TR[facility] CHIEF COMPLAINT: Sepsis, ARF with need for RRT   History of Present Illness:  72 yo male with low grade myelodysplastic syndrome found to have generalized lymphadenopathy from high grade large B cell lymphoma. Plan to start on R-CHOP. Came to ER with near syncope. He had fever and hypotension with elevated lactic acid level. Started on ABx. He had seizure like activity, and Rt facial paresthesia. He developed renal failure. Seen by cardiology for PAF. Had PICC placed and started on R mini CHOP on 08/05/22. Renal fx got worse and nephrology consulted. Had elevated uric acid with concern for tumor lysis syndrome and received rasburicase. Rituxan held because of this. Renal fx and mental status got worse. Concern for aspiration pneumonitis. Nephrology recommending to start CRRT. PCCM consulted.   Pertinent Medical History:  Prostate cancer, MDS, B cell lymphoma, PE, HTN, PAF, Recurrent UTI  Significant Hospital Events: Including procedures, antibiotic start and stop dates in addition to other pertinent events   6/03 Presented to ED for fever, near-syncope. Presumed sepsis. Admitted to [facility]. 6/13 Worsening acute renal failure as evidenced by rising BUN/Cr, hyperkalemia to 5.6. PCCM consulted for ARF/need for RRT, line placement. Intubated. R IJ Trialysis catheter placed. CRRT started. 6/15 transfuse 1 unit PRBC 6/16 extubate 6/17 no acute events overnight, remains on CRRT 6/18 no acute events overnight, remains on CRRT 6/19 Rituximab, CRRT  Interim History / Subjective:  Remains on CRRT, alert and conversant.  Objective:  Blood pressure (!) 162/92, pulse 93, temperature 98.3 F (36.8 C), temperature source Axillary, resp. rate 19, height 5\' 7"  (1.702 m), weight 77.3 kg, SpO2 99 %. CVP:  [5 mmHg] 5 mmHg      Intake/Output Summary (Last 24 hours)  at 08/16/2022 0744 Last data filed at 08/16/2022 8295 Gross per 24 hour  Intake 1769.92 ml  Output 1038 ml  Net 731.92 ml    Filed Weights   08/14/22 0500 08/15/22 0800 08/16/22 0424  Weight: 79 kg 78.1 kg 77.3 kg   Physical Examination: General: Acute on chronic ill-appearing elderly male lying in bed in no acute distress HEENT: [place]/AT, MM pink/moist, PERRL,  Neuro: Alert and interactive, engages in voncersation CV: s1s2 regular rate and rhythm, no murmur, rubs, or gallops,  PULM: Diminished bilaterally, no increased work of breathing, on RA GI: soft, bowel sounds active in all 4 quadrants, non-tender, non-distended, tolerating tube feeds Extremities: warm/dry, no edema  Skin: no rashes or lesions  Resolved Hospital Problem List:  Acute hypoxic respiratory failure with compromised airway  -Extubated 6/16, on room air Hyperkalemia Aspiration Pna -s/p 5 days zosyn end 6/18 Encephalopathy  Assessment & Plan:   Acute renal failure: Suspect multifactorial related to possible sepsis as well as contribution of tumor lysis syndrome given elevated uric acid etc. -- Status post rasburicase x 2 -- Dialysis per nephrology, normotensive, after discussion with oncology and neprhology, plan to continue CRRT until after dose of rituximab (6/19), CRRT stopped PM 6/20, [facility] as no renal recovery   Paroxysmal A-fib: No AC due to history of bleeding -- Continue amiodarone   Pancytopenia: -- Transfuse hemoglobin greater than 7, platelets greater than 20 -- Granix per hematology/oncology   Tumor lysis syndrome: -- Continue allopurinol   Best Practice: (right click and "Reselect all SmartList Selections" daily)   Diet/type: tubefeeds DVT prophylaxis: SCDs  GI prophylaxis: PPI Lines: Central line and Dialysis Catheter Foley:  Yes, and it is still needed Code Status:  full code Last date of multidisciplinary goals of care discussion [n/a]  Critical care time:    CRITICAL CARE N/a  Karren Burly, MD [facility] Personal contact information can be found on Amion  If no contact or response made please call [phone] 08/16/2022, 7:44 AM

## 2022-08-16 NOTE — Progress Notes (Signed)
[facility] Progress Note   Assessment/ Plan:   AKI - b/l creatinine 0.6- 0.9 here on admission. Creat increased up to 1.3 on 6/10 and 2.3 on 6/11.  UA showed ^wbc, min rbc's/ protein. Renal US showed no obstruction. 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 Uric acid/ hyperkalemia peaked and now have recovered w/ CRRT and rasburicase. CRRT started 6/13. No signs of renal recovery.  - CRRT 6/14-6/20  - HD scheduled for tomorrow 08/17/22  - will transfer to [facility] Tumor lysis syndrome - sp rasburicase 6mg  on 6/12 and another 6mg  on 6/13. UA down to 2- 3 range. Per ONC.  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 Ritux 6/19 Parox atrial fib- on amiodarone  Subjective:    Seen in room. No issues other than feeling thirsty   Objective:   BP (!) 154/91   Pulse 87   Temp 98.2 F (36.8 C) (Oral)   Resp 20   Ht 5\' 7"  (1.702 m)   Wt 77.3 kg   SpO2 97%   BMI 26.69 kg/m   Physical Exam: Gen:NAD, lying in bed CVS: RRR Resp: clear Abd: soft Ext: trace LE edema ACCESS: R internal jugular nontunneled HD catheter  Labs: BMET Recent Labs  Lab 08/13/22 0300 08/13/22 1629 08/14/22 0425 08/14/22 1714 08/15/22 0504 08/15/22 1528 08/15/22 2244 08/16/22 0423  NA 137 136 136 135 135 135 136 133*  K 3.7 3.9 4.0 4.3 4.3 4.4 4.8 5.0  CL 104 105 104 105 105 106 103 101  CO2 28 26 26 25 25 26 25 25   GLUCOSE 107* 140* 144* 155* 163* 144* 138* 117*  BUN 34* 29* 31* 34* 34* 37* 52* 60*  CREATININE 1.36* 1.39* 1.47* 1.60* 1.55* 1.65* 2.26* 2.59*  CALCIUM 7.5* 7.5* 7.5* 7.4* 7.2* 7.2* 7.4* 7.4*  PHOS 2.9 2.1* 1.7* 1.5* 3.0 2.3*  --  3.0   CBC Recent Labs  Lab 08/13/22 0300 08/14/22 0425 08/15/22 1315 08/15/22 2244 08/16/22 0423  WBC 0.8* 0.5* 0.7*  --  1.0*  NEUTROABS 0.3* 0.1* 0.2*  --  0.3*  HGB 7.9* 7.9* 6.3* 7.6* 7.6*  HCT 24.8* 25.3* 20.3* 24.1* 23.6*  MCV  87.0 88.2 87.9  --  87.1  PLT 34* 28* 42*  --  53*      Medications:     sodium chloride   Intravenous Once   acetaminophen  650 mg Per Tube Once   allopurinol  100 mg Per Tube BID   amiodarone  200 mg Per Tube Daily   B-complex with vitamin C  1 tablet Per Tube Daily   Chlorhexidine Gluconate Cloth  6 each Topical Q2200   feeding supplement (PROSource TF20)  60 mL Per Tube TID   insulin aspart  0-6 Units Subcutaneous Q4H   mouth rinse  15 mL Mouth Rinse 4 times per day   pantoprazole (PROTONIX) IV  40 mg Intravenous Daily   sodium chloride flush  10-40 mL Intracatheter Q12H   Tbo-filgastrim (GRANIX) SQ  480 mcg Subcutaneous q1800     Bufford Buttner, MD 08/16/2022, 12:55 PM

## 2022-08-16 NOTE — Plan of Care (Signed)
  Problem: Education: Goal: Knowledge of General Education information will improve Description: Including pain rating scale, medication(s)/side effects and non-pharmacologic comfort measures Outcome: Progressing   Problem: Health Behavior/Discharge Planning: Goal: Ability to manage health-related needs will improve Outcome: Progressing   Problem: Clinical Measurements: Goal: Ability to maintain clinical measurements within normal limits will improve Outcome: Progressing Goal: Will remain free from infection Outcome: Progressing Goal: Diagnostic test results will improve Outcome: Progressing Goal: Respiratory complications will improve Outcome: Progressing Goal: Cardiovascular complication will be avoided Outcome: Progressing   Problem: Activity: Goal: Risk for activity intolerance will decrease Outcome: Progressing   Problem: Nutrition: Goal: Adequate nutrition will be maintained Outcome: Progressing   Problem: Coping: Goal: Level of anxiety will decrease Outcome: Progressing   Problem: Elimination: Goal: Will not experience complications related to bowel motility Outcome: Progressing Goal: Will not experience complications related to urinary retention Outcome: Progressing   Problem: Pain Managment: Goal: General experience of comfort will improve Outcome: Progressing   Problem: Safety: Goal: Ability to remain free from injury will improve Outcome: Progressing   Problem: Skin Integrity: Goal: Risk for impaired skin integrity will decrease Outcome: Progressing   Problem: Education: Goal: Ability to describe self-care measures that may prevent or decrease complications (Diabetes Survival Skills Education) will improve Outcome: Progressing Goal: Individualized Educational Video(s) Outcome: Progressing   Problem: Coping: Goal: Ability to adjust to condition or change in health will improve Outcome: Progressing   Problem: Fluid Volume: Goal: Ability to  maintain a balanced intake and output will improve Outcome: Progressing   Problem: Health Behavior/Discharge Planning: Goal: Ability to identify and utilize available resources and services will improve Outcome: Progressing Goal: Ability to manage health-related needs will improve Outcome: Progressing   Problem: Metabolic: Goal: Ability to maintain appropriate glucose levels will improve Outcome: Progressing   Problem: Nutritional: Goal: Maintenance of adequate nutrition will improve Outcome: Progressing Goal: Progress toward achieving an optimal weight will improve Outcome: Progressing   Problem: Skin Integrity: Goal: Risk for impaired skin integrity will decrease Outcome: Progressing   Problem: Tissue Perfusion: Goal: Adequacy of tissue perfusion will improve Outcome: Progressing   Problem: Fluid Volume: Goal: Hemodynamic stability will improve Outcome: Progressing   Problem: Clinical Measurements: Goal: Diagnostic test results will improve Outcome: Progressing Goal: Signs and symptoms of infection will decrease Outcome: Progressing   Problem: Respiratory: Goal: Ability to maintain adequate ventilation will improve Outcome: Progressing   Problem: Education: Goal: Knowledge of condition and prescribed therapy will improve Outcome: Progressing   Problem: Cardiac: Goal: Will achieve and/or maintain adequate cardiac output Outcome: Progressing   Problem: Physical Regulation: Goal: Complications related to the disease process, condition or treatment will be avoided or minimized Outcome: Progressing   

## 2022-08-16 NOTE — Progress Notes (Signed)
Nutrition Follow-up  DOCUMENTATION CODES:   Not applicable  INTERVENTION:  - Restart tube feeds once medically appropriate/able. Vital 1.5 at 60 ml/h (1440 ml per day) Provides 2160 kcal, 97 gm protein, 1100 ml free water daily   - Monitor magnesium, potassium, and phosphorus, replete as needed.   - FWF per CCM/MD.    - Continue B complex w/ Vitamin C.   - Monitor for diet advancement.   NUTRITION DIAGNOSIS:   Increased nutrient needs related to chronic illness (lymphoma) as evidenced by estimated needs. *ongoing  GOAL:   Patient will meet greater than or equal to 90% of their needs *met with TF  MONITOR:   Vent status, Labs, Weight trends, I & O's  REASON FOR ASSESSMENT:   Consult Enteral/tube feeding initiation and management  ASSESSMENT:   72 y.o. 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 Admitted for sepsis of unknown cause.  6/3 Admit 6/5 Regular diet 6/10 patient vomited, made NPO, NGT placed for decompression 6/13: intubated, started CRRT, RD consult received to start trickle tube feeds of Vital HP at 20 ml/hr. 6/14 switched to Vital 1.5 6/16 Extubated 6/19 Reached goal TF of 45mL/hr 6/20 CRRT stopped  Patient sleeping at time of visit, did not awake to sound of voice. No family at bedside.  NGT remains in place, TF infusing at goal of 9mL/hr.  Patient has been tolerating TF until early this AM when he reported abdominal pain and nausea. Stat KUB ordered but non-obstructive bowel gas pattern found so TF restarted. It is noted patient complained of more discomfort this afternoon so TF once again stopped per MD.   However, patient remains NPO at this time. SLP attempted to see patient today but he requested to rest so still needs eval.   Patient has  remained on CRRT until yesterday evening when it was stopped. As there are no signs of renal recovery, nephrology note today indicates plan to transfer to [facility] for HD tomorrow.   Admit weight: 185# (reported?) -> 194# Current weight: 170# Possible weight loss since admission, monitor weight trends.  Medications reviewed and include: B complex with Vitamin C, Insulin  Labs reviewed:  Na 133 Creatinine 2.59   Diet Order:   Diet Order             Diet NPO time specified  Diet effective now                   EDUCATION NEEDS:  Education needs have been addressed  Skin:  Skin Assessment: Reviewed RN Assessment  Last BM:  6/20 - recal tube  Height:  Ht Readings from Last 1 Encounters:  08/01/22 5\' 7"  (1.702 m)   Weight:  Wt Readings from Last 1 Encounters:  08/16/22 77.3 kg   Ideal Body Weight:  67.27 kg  BMI:  Body mass index is 26.69 kg/m.  Estimated Nutritional Needs:  Kcal:  2100-2300 kcals Protein:  95-115 grams Fluid:  >/= 2.1L or per MD    Shelle Iron RD, LDN For contact information, refer to [facility].

## 2022-08-16 NOTE — Evaluation (Signed)
SLP Cancellation Note  Patient Details Name: Tyler [name] MRN: [id number] DOB: 1951-02-19   Cancelled treatment:       Reason Eval/Treat Not Completed: Other (comment) (Pt sleepy, opened his eyes briefly and with direct yes/no response, he requested SLP to attempt to see him for swallow evaluation later foday after he has rested. RN reports he has been getting sips of liquids.. Requested RN contact SlP when pt awakens.)  [name], MS [facility] SLP Acute Rehab Services Office [phone]   Chales Abrahams 08/16/2022, 10:36 AM

## 2022-08-16 NOTE — Evaluation (Signed)
SLP Cancellation Note  Patient Details Name: Tyler [name] MRN: [id number] DOB: 1950/05/17   Cancelled treatment:       Reason Eval/Treat Not Completed: Other (comment) (pt now transferred to [facility]: [name], will continue efforts for repeat swallow evaluation)  [name], MS [facility] SLP Acute Rehab Services Office [phone]  Chales Abrahams 08/16/2022, 4:44 PM

## 2022-08-16 NOTE — TOC Progression Note (Signed)
Transition of Care [facility]) - Progression Note    Patient Details  Name: Tyler [name] MRN: [id number] Date of Birth: Jul 28, 1950  Transition of [facility]) CM/SW Contact  Beckie Busing, RN Phone Number:[phone]  08/16/2022, 1:25 PM  Clinical Narrative TOC continues to follow for disposition plan  CRRT 6/14-6/20 - HD scheduled for tomorrow 08/17/22, [facility]               Expected Discharge Plan: Home/Self Care Barriers to Discharge: Continued Medical Work up  Expected Discharge Plan and Services                                               Social Determinants of Health (SDOH) Interventions SDOH Screenings   Food Insecurity: No Food Insecurity (07/30/2022)  Housing: Low Risk  (07/30/2022)  Transportation Needs: No Transportation Needs (07/30/2022)  Utilities: Not At Risk (07/30/2022)  Tobacco Use: Low Risk  (08/08/2022)    Readmission Risk Interventions    07/30/2022    2:44 PM  Readmission Risk Prevention Plan  Transportation Screening Complete  PCP or Specialist Appt within 3-5 Days Complete  HRI or Home Care Consult Complete  Social Work Consult for Recovery Care Planning/Counseling Complete  Palliative Care Screening Not Applicable  Medication Review Oceanographer) Complete

## 2022-08-16 NOTE — Progress Notes (Signed)
eLink Physician-Brief Progress Note Patient Name: Tyler [name] DOB: August 14, 1950 MRN: [id number]   Date of Service  08/16/2022  HPI/Events of Note  KUB images reviewed, no evidence of obstruction.  eICU Interventions  No intervention indicated.        Thomasene Lot Zakyah Yanes 08/16/2022, 5:50 AM

## 2022-08-16 NOTE — Progress Notes (Signed)
eLink Physician-Brief Progress Note Patient Name: Tyler [name] DOB: 1950-11-18 MRN: [id number]   Date of Service  08/16/2022  HPI/Events of Note  Patient with abdominal distension and pain, bedside RN paused tube feeding.  eICU Interventions  Stat KUB ordered to r/o ileus.        Tyler [name] 08/16/2022, 4:15 AM

## 2022-08-17 DIAGNOSIS — A419 Sepsis, unspecified organism: Secondary | ICD-10-CM | POA: Diagnosis not present

## 2022-08-17 DIAGNOSIS — R6520 Severe sepsis without septic shock: Secondary | ICD-10-CM | POA: Diagnosis not present

## 2022-08-17 LAB — GLUCOSE, CAPILLARY
Glucose-Capillary: 96 mg/dL (ref 70–99)
Glucose-Capillary: 98 mg/dL (ref 70–99)
Glucose-Capillary: 112 mg/dL — ABNORMAL HIGH (ref 70–99)
Glucose-Capillary: 86 mg/dL (ref 70–99)

## 2022-08-17 LAB — CBC WITH DIFFERENTIAL/PLATELET
Abs Immature Granulocytes: 0.02 10*3/uL (ref 0.00–0.07)
Basophils Absolute: 0 10*3/uL (ref 0.0–0.1)
Basophils Relative: 1 %
Eosinophils Absolute: 0 10*3/uL (ref 0.0–0.5)
Eosinophils Relative: 1 %
HCT: 22.7 % — ABNORMAL LOW (ref 39.0–52.0)
Hemoglobin: 7.2 g/dL — ABNORMAL LOW (ref 13.0–17.0)
Immature Granulocytes: 1 %
Lymphocytes Relative: 19 %
Lymphs Abs: 0.4 10*3/uL — ABNORMAL LOW (ref 0.7–4.0)
MCH: 27 pg (ref 26.0–34.0)
MCHC: 31.7 g/dL (ref 30.0–36.0)
MCV: 85 fL (ref 80.0–100.0)
Monocytes Absolute: 0.5 10*3/uL (ref 0.1–1.0)
Monocytes Relative: 22 %
Neutro Abs: 1.2 10*3/uL — ABNORMAL LOW (ref 1.7–7.7)
Neutrophils Relative %: 56 %
Platelets: 78 10*3/uL — ABNORMAL LOW (ref 150–400)
RBC: 2.67 MIL/uL — ABNORMAL LOW (ref 4.22–5.81)
RDW: 15.6 % — ABNORMAL HIGH (ref 11.5–15.5)
WBC: 2.2 10*3/uL — ABNORMAL LOW (ref 4.0–10.5)
nRBC: 0 % (ref 0.0–0.2)

## 2022-08-17 LAB — TYPE AND SCREEN

## 2022-08-17 LAB — BPAM RBC: Blood Product Expiration Date: 202407202359

## 2022-08-17 LAB — RENAL FUNCTION PANEL
Albumin: 1.7 g/dL — ABNORMAL LOW (ref 3.5–5.0)
Anion gap: 9 (ref 5–15)
BUN: 88 mg/dL — ABNORMAL HIGH (ref 8–23)
CO2: 23 mmol/L (ref 22–32)
Calcium: 7.6 mg/dL — ABNORMAL LOW (ref 8.9–10.3)
Chloride: 101 mmol/L (ref 98–111)
Creatinine, Ser: 4.17 mg/dL — ABNORMAL HIGH (ref 0.61–1.24)
GFR, Estimated: 15 mL/min — ABNORMAL LOW (ref >60)
Glucose, Bld: 108 mg/dL — ABNORMAL HIGH (ref 70–99)
Phosphorus: 3.9 mg/dL (ref 2.5–4.6)
Potassium: 5.8 mmol/L — ABNORMAL HIGH (ref 3.5–5.1)
Sodium: 133 mmol/L — ABNORMAL LOW (ref 135–145)

## 2022-08-17 LAB — PREPARE RBC (CROSSMATCH)

## 2022-08-17 LAB — HEPATITIS B SURFACE ANTIBODY, QUANTITATIVE: Hep B S AB Quant (Post): 13.6 m[IU]/mL (ref >9.9)

## 2022-08-17 MED ORDER — POLYVINYL ALCOHOL 1.4 % OP SOLN
1.0000 [drp] | OPHTHALMIC | Status: AC | PRN
  Filled 2022-08-17: qty 15

## 2022-08-17 MED ORDER — HEPARIN SODIUM (PORCINE) 1000 UNIT/ML IJ SOLN
INTRAMUSCULAR | Status: AC
  Filled 2022-08-17: qty 3

## 2022-08-17 MED ORDER — SODIUM CHLORIDE 0.9% IV SOLUTION: Freq: Once | INTRAVENOUS | Status: AC

## 2022-08-17 MED ORDER — ANTICOAGULANT SODIUM CITRATE 4% (200MG/5ML) IV SOLN: 5.0000 mL | Status: DC | PRN

## 2022-08-17 MED ORDER — HEPARIN SODIUM (PORCINE) 1000 UNIT/ML DIALYSIS: 1000.0000 [IU] | INTRAMUSCULAR | Status: DC | PRN

## 2022-08-17 MED ORDER — ALTEPLASE 2 MG IJ SOLR: 2.0000 mg | Freq: Once | INTRAMUSCULAR | Status: DC | PRN

## 2022-08-17 NOTE — Progress Notes (Signed)
OT Cancellation Note  Patient Details Name: Tyler [name] MRN: [id number] DOB: 1950-08-30   Cancelled Treatment:    Reason Eval/Treat Not Completed: Patient at procedure or test/ unavailable (Attempted to see pt for skilled OT eval. However, pt is currently off unit for HD. OT will follow up with pt to reattempt skilled OT eval as appropriate/available.)  [name] "[name], OTR/L, MA Acute Rehab [phone]  Lendon Colonel 08/17/2022, 10:04 AM

## 2022-08-17 NOTE — Progress Notes (Signed)
PROGRESS NOTE                                                                                                                                                                                                             Patient Demographics:    Tyler [name], is a 72 y.o. male, DOB - 09/27/1950, ZOX:[id number]  Outpatient Primary MD for the patient is Nechama Guard, FNP    LOS - 19  Admit date - 07/29/2022    Chief Complaint  Patient presents with   Near Syncope   Loss of Consciousness       Brief Narrative (HPI from H&P)   72 yo male with history of PE, hypertension, paroxysmal atrial fibrillation, recurrent UTIs, low grade myelodysplastic syndrome found to have generalized lymphadenopathy from high grade large B cell lymphoma. Plan was to start on R-CHOP. Came to ER with near syncope. He had fever and hypotension with elevated lactic acid level. Started on ABx.  He had seizure like activity, and Rt facial paresthesia. He developed renal failure. Seen by cardiology for PAF. Had PICC placed and started on R mini CHOP on 08/05/22. Renal fx got worse and nephrology consulted. Had elevated uric acid with concern for tumor lysis syndrome and received rasburicase. Rituxan held because of this. Renal fx and mental status got worse. Concern for aspiration pneumonitis.  He was seen by the ICU team and kept in ICU, also seen by nephrology, oncology along with cardiology.  He was eventually started on CRRT and subsequently switched to HD treatments, he was transferred to my care on 08/17/2022 on day 19 of his hospital stay when he was switched from [facility] to [facility].    Significant Hospital Events: I   6/03 Presented to ED for fever, near-syncope. Presumed sepsis. Admitted to [facility]. 6/13 Worsening acute renal failure as evidenced by rising BUN/Cr, hyperkalemia to 5.6. PCCM consulted for ARF/need for RRT, line placement.  Intubated. R IJ Trialysis catheter placed. CRRT started. 6/15 transfuse 1 unit PRBC 6/16 extubate 6/17 no acute events overnight, remains on CRRT 6/18 no acute events overnight, remains on CRRT 6/19 Rituximab, CRRT 08/17/2022 started on HD 08/17/2022 transferred to hospitalist service under my care   Subjective:    Rawley Harju today has, No headache, No chest pain, No abdominal pain - No Nausea, No  new weakness tingling or numbness, no shortness of breath.  Wants NG tube to be removed.   Assessment  & Plan :   Aspiration pneumonia, sepsis, acute hypoxic respiratory failure with compromised airway.  Initially intubated kept in ICU, extubated on 08/11/2022, has finished antibiotics on 08/13/2022.  Currently on 2 L nasal cannula, still has NG tube, speech following.  Continue to monitor closely.  Sepsis with hypotension.  Required antibiotics and pressor support in ICU, now resolved.  Off of antibiotics and pressors.  Monitor.    Acute renal failure: Suspect multifactorial related to possible sepsis as well as contribution of tumor lysis syndrome given elevated uric acid etc. s/p rasburicase x 2, seen by nephrology, has right IJ HD catheter placed, initially underwent CRRT treatment now started on HD treatment on 08/17/2022.  Nephrology on board.  Paroxysmal A-fib: No AC due to history of bleeding - seen by cardiology, currently on amiodarone for rate control, not on anticoagulation due to high bleeding risk in the setting of pancytopenia.  Continue to monitor on telemetry.  Stable recent TSH.   High-grade Follicular B cell lymphoma.  Being followed by hematology, s/p mini CHOP treatment on 08/06/2022, complicated by severe pancytopenia, tumor lysis syndrome with AKI. Also received Rituxan this admission.  Defer further management to oncology service.  Pancytopenia:  -- Transfuse hemoglobin greater than 7.5, platelets counts gradually rising, neutropenia and leukopenia improving with Granix.  Defer  management to hematology and oncology team.   Tumor lysis syndrome: Continue allopurinol and now also getting HD for renal failure.   Metabolic encephalopathy.  Improving.  Continue to monitor.  Generalized weakness and dysphagia.  On NG tube feeds, speech evaluation, if proper oral intake then will try and discontinue core track tube as patient complaining of discomfort.       Condition - Extremely Guarded  Family Communication  : None present  Code Status : Full code  Consults  : PCCM, oncology, nephrology, cardiology  PUD Prophylaxis : PPI   Procedures  :            Disposition Plan  :    Status is: Inpatient   DVT Prophylaxis  :    Place and maintain sequential compression device Start: 08/07/22 0936 SCDs Start: 07/30/22 0108    Lab Results  Component Value Date   PLT 78 (L) 08/17/2022    Diet :  Diet Order             Diet NPO time specified  Diet effective now                    Inpatient Medications  Scheduled Meds:  sodium chloride   Intravenous Once   acetaminophen  650 mg Per Tube Once   allopurinol  100 mg Per Tube BID   amiodarone  200 mg Per Tube Daily   B-complex with vitamin C  1 tablet Per Tube Daily   Chlorhexidine Gluconate Cloth  6 each Topical Q0600   insulin aspart  0-6 Units Subcutaneous Q4H   mouth rinse  15 mL Mouth Rinse 4 times per day   pantoprazole (PROTONIX) IV  40 mg Intravenous Daily   Tbo-filgastrim (GRANIX) SQ  480 mcg Subcutaneous q1800   Continuous Infusions:  sodium chloride Stopped (08/13/22 0021)   anticoagulant sodium citrate     feeding supplement (VITAL 1.5 CAL) 40 mL/hr at 08/17/22 0100   PRN Meds:.alteplase, anticoagulant sodium citrate, docusate, heparin, ipratropium-albuterol, lip balm, melatonin, ondansetron **OR** ondansetron (  ZOFRAN) IV, oxyCODONE, phenol, polyethylene glycol, senna, sodium chloride flush, traZODone    Objective:   Vitals:   08/17/22 0801 08/17/22 0805 08/17/22 0830  08/17/22 0900  BP: 120/88 136/79 130/85 133/84  Pulse: 91 88 96 98  Resp: (!) 24 (!) 24 (!) 21 (!) 22  Temp:      TempSrc:      SpO2: 91% 98% 98% 98%  Weight: 77.3 kg     Height:        Wt Readings from Last 3 Encounters:  08/17/22 77.3 kg  07/19/22 84.3 kg  07/16/22 85.7 kg     Intake/Output Summary (Last 24 hours) at 08/17/2022 0921 Last data filed at 08/17/2022 0100 Gross per 24 hour  Intake 411.67 ml  Output 255 ml  Net 156.67 ml     Physical Exam  Awake Alert, No new F.N deficits, Normal affect, right arm PICC line, right IJ dialysis catheter, core track tube [place].AT,PERRAL Supple Neck, No JVD,   Symmetrical Chest wall movement, Good air movement bilaterally, CTAB RRR,No Gallops,Rubs or new Murmurs,  +ve B.Sounds, Abd Soft, No tenderness,   No Cyanosis, Clubbing or edema       Data Review:    Recent Labs  Lab 08/13/22 0300 08/14/22 0425 08/15/22 1315 08/15/22 2244 08/16/22 0423 08/17/22 0155  WBC 0.8* 0.5* 0.7*  --  1.0* 2.2*  HGB 7.9* 7.9* 6.3* 7.6* 7.6* 7.2*  HCT 24.8* 25.3* 20.3* 24.1* 23.6* 22.7*  PLT 34* 28* 42*  --  53* 78*  MCV 87.0 88.2 87.9  --  87.1 85.0  MCH 27.7 27.5 27.3  --  28.0 27.0  MCHC 31.9 31.2 31.0  --  32.2 31.7  RDW 17.0* 16.3* 15.9*  --  15.7* 15.6*  LYMPHSABS 0.4* 0.3* 0.4*  --  0.4* 0.4*  MONOABS 0.1 0.1 0.1  --  0.2 0.5  EOSABS 0.1 0.1 0.0  --  0.0 0.0  BASOSABS 0.0 0.0 0.0  --  0.0 0.0    Recent Labs  Lab 08/11/22 0316 08/11/22 1544 08/12/22 0515 08/12/22 1400 08/13/22 0300 08/13/22 1629 08/14/22 0425 08/14/22 1714 08/15/22 0504 08/15/22 1528 08/15/22 2244 08/16/22 0423  NA 134*   < > 137   < > 137   < > 136 135 135 135 136 133*  K 3.7   < > 3.3*   < > 3.7   < > 4.0 4.3 4.3 4.4 4.8 5.0  CL 99   < > 101   < > 104   < > 104 105 105 106 103 101  CO2 25   < > 28   < > 28   < > 26 25 25 26 25 25   ANIONGAP 10   < > 8   < > 5   < > 6 5 5  3* 8 7  GLUCOSE 185*   < > 114*   < > 107*   < > 144* 155* 163* 144* 138*  117*  BUN 52*   < > 41*   < > 34*   < > 31* 34* 34* 37* 52* 60*  CREATININE 1.58*   < > 1.44*   < > 1.36*   < > 1.47* 1.60* 1.55* 1.65* 2.26* 2.59*  AST 33  --  31  --   --   --   --   --   --   --   --   --   ALT 31  --  31  --   --   --   --   --   --   --   --   --  ALKPHOS 84  --  79  --   --   --   --   --   --   --   --   --   BILITOT 3.2*  --  2.5*  --   --   --   --   --   --   --   --   --   ALBUMIN 1.8*  1.7*   < > 1.7*  1.6*   < > 1.8*   < > 1.9* 2.0* 1.8* 1.8*  --  1.8*  MG 2.5*  --  2.3  --  2.5*  --  2.5*  --  2.3  --   --  2.4  CALCIUM 7.4*   < > 7.5*   < > 7.5*   < > 7.5* 7.4* 7.2* 7.2* 7.4* 7.4*   < > = values in this interval not displayed.   Lab Results  Component Value Date   TSH 3.619 08/01/2022      Recent Labs  Lab 08/12/22 0515 08/12/22 1400 08/13/22 0300 08/13/22 1629 08/14/22 0425 08/14/22 1714 08/15/22 0504 08/15/22 1528 08/15/22 2244 08/16/22 0423  MG 2.3  --  2.5*  --  2.5*  --  2.3  --   --  2.4  CALCIUM 7.5*   < > 7.5*   < > 7.5* 7.4* 7.2* 7.2* 7.4* 7.4*   < > = values in this interval not displayed.    Recent Labs  Lab 08/13/22 0300 08/13/22 1629 08/14/22 0425 08/14/22 1714 08/15/22 0504 08/15/22 1315 08/15/22 1528 08/15/22 2244 08/16/22 0423 08/17/22 0155  WBC 0.8*  --  0.5*  --   --  0.7*  --   --  1.0* 2.2*  PLT 34*  --  28*  --   --  42*  --   --  53* 78*  CREATININE 1.36*   < > 1.47* 1.60* 1.55*  --  1.65* 2.26* 2.59*  --    < > = values in this interval not displayed.      Micro Results Recent Results (from the past 240 hour(s))  Culture, Respiratory w Gram Stain     Status: None   Collection Time: 08/08/22 12:54 PM   Specimen: Tracheal Aspirate; Respiratory  Result Value Ref Range Status   Specimen Description   Final    TRACHEAL ASPIRATE Performed at [facility], [address], [place], [place] [address]    Special Requests   Final    NONE Performed at [facility], [address] [place], [place] [address]    Gram Stain   Final    NO WBC SEEN MODERATE GRAM POSITIVE RODS FEW GRAM POSITIVE COCCI IN CLUSTERS    Culture   Final    ABUNDANT Normal respiratory flora-no Staph aureus or Pseudomonas seen Performed at [facility], [address] [place], [place] [address]    Report Status 08/10/2022 FINAL  Final    Radiology Reports DG Abd 1 View  Result Date: 08/16/2022 CLINICAL DATA:  Ileus. EXAM: ABDOMEN - 1 VIEW COMPARISON:  08/08/2022. FINDINGS: The bowel gas pattern is normal. An enteric tube terminates in the stomach. No radio-opaque calculi or other significant radiographic abnormality are seen. IMPRESSION: Nonobstructive bowel-gas pattern. Electronically Signed   By: Thornell Sartorius M.D.   On: 08/16/2022 04:56      Signature  -   Susa Raring M.D on 08/17/2022 at 9:21 AM   -  To page go to [email or web address]

## 2022-08-17 NOTE — Procedures (Signed)
HD Note:  Some information was entered later than the data was gathered due to patient care needs. The stated time with the data is accurate.  Received patient in bed to unit.  Alert and oriented to person and situation, able to answer questions of care without apparent difficulty based on limited english.  Informed consent received from patient daughter over the phone and witnessed by 2 Rn's and in chart.   TX duration: 3 hours  Patient tolerated well until the last half hour when he complained of not feeling well and his chest  walls hurt.  See flowsheet   Transported back to the room  Alert, without acute distress.  Hand-off given to patient's nurse.   Access used: Right internal jugular HD catheter, temporary Access issues: None  Total UF removed: 1600 ml   [facility]

## 2022-08-17 NOTE — Progress Notes (Signed)
PT Cancellation Note  Patient Details Name: Tyler [name] MRN: [id number] DOB: 03-Jul-1950   Cancelled Treatment:    Reason Eval/Treat Not Completed: Patient at procedure or test/unavailable. Pt in HD. PT to re-attempt as time allows.   Ilda Foil 08/17/2022, 9:26 AM

## 2022-08-17 NOTE — Procedures (Signed)
Patient seen and examined on Hemodialysis. The procedure was supervised and I have made appropriate changes. BP 133/84   Pulse 98   Temp 98.2 F (36.8 C) (Oral)   Resp (!) 22   Ht 5\' 7"  (1.702 m)   Wt 77.3 kg   SpO2 98%   BMI 26.69 kg/m   QB 300 mL/ min via TDC, UF goal 2L  Tolerating treatment without complaints at this time.   Bufford Buttner MD [facility] [phone] 9:26 AM

## 2022-08-17 NOTE — Progress Notes (Signed)
[facility] Progress Note   Assessment/ Plan:   AKI - b/l creatinine 0.6- 0.9 here on admission. Creat increased up to 1.3 on 6/10 and 2.3 on 6/11.  UA showed ^wbc, min rbc's/ protein. Renal US showed no obstruction. 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 Uric acid/ hyperkalemia peaked and now have recovered w/ CRRT and rasburicase. CRRT started 6/13. No signs of renal recovery.  - CRRT 6/14-6/20  - HD today 08/17/22  - UF as tolerated  - has nontunneled HD cath- will need to convert to tunneled at some point Tumor lysis syndrome - sp rasburicase 6mg  on 6/12 and another 6mg  on 6/13. UA down to 2- 3 range. Per ONC.  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 Ritux 6/19 Parox atrial fib- on amiodarone Abd pain/ distention- KUB negative Dispo: pending  Subjective:    Transferred to [facility] for IHD.  Doing OK.     Objective:   BP 133/84   Pulse 98   Temp 98.2 F (36.8 C) (Oral)   Resp (!) 22   Ht 5\' 7"  (1.702 m)   Wt 77.3 kg   SpO2 98%   BMI 26.69 kg/m   Physical Exam: Gen:NAD, lying in bed CVS: RRR Resp: clear Abd: soft, somewhat distended Ext: trace LE edema ACCESS: R internal jugular nontunneled HD catheter  Labs: BMET Recent Labs  Lab 08/13/22 0300 08/13/22 1629 08/14/22 0425 08/14/22 1714 08/15/22 0504 08/15/22 1528 08/15/22 2244 08/16/22 0423  NA 137 136 136 135 135 135 136 133*  K 3.7 3.9 4.0 4.3 4.3 4.4 4.8 5.0  CL 104 105 104 105 105 106 103 101  CO2 28 26 26 25 25 26 25 25   GLUCOSE 107* 140* 144* 155* 163* 144* 138* 117*  BUN 34* 29* 31* 34* 34* 37* 52* 60*  CREATININE 1.36* 1.39* 1.47* 1.60* 1.55* 1.65* 2.26* 2.59*  CALCIUM 7.5* 7.5* 7.5* 7.4* 7.2* 7.2* 7.4* 7.4*  PHOS 2.9 2.1* 1.7* 1.5* 3.0 2.3*  --  3.0   CBC Recent Labs  Lab 08/14/22 0425 08/15/22 1315 08/15/22 2244 08/16/22 0423 08/17/22 0155  WBC 0.5* 0.7*  --  1.0* 2.2*  NEUTROABS 0.1* 0.2*   --  0.3* 1.2*  HGB 7.9* 6.3* 7.6* 7.6* 7.2*  HCT 25.3* 20.3* 24.1* 23.6* 22.7*  MCV 88.2 87.9  --  87.1 85.0  PLT 28* 42*  --  53* 78*      Medications:     sodium chloride   Intravenous Once   acetaminophen  650 mg Per Tube Once   allopurinol  100 mg Per Tube BID   amiodarone  200 mg Per Tube Daily   B-complex with vitamin C  1 tablet Per Tube Daily   Chlorhexidine Gluconate Cloth  6 each Topical Q0600   insulin aspart  0-6 Units Subcutaneous Q4H   mouth rinse  15 mL Mouth Rinse 4 times per day   pantoprazole (PROTONIX) IV  40 mg Intravenous Daily   Tbo-filgastrim (GRANIX) SQ  480 mcg Subcutaneous q1800     Bufford Buttner, MD 08/17/2022, 9:25 AM

## 2022-08-17 NOTE — TOC Progression Note (Signed)
Transition of Care [facility]) - Progression Note    Patient Details  Name: Tyler [name] MRN: [id number] Date of Birth: 06/18/1950  Transition of [facility]) CM/SW Contact  Mearl Latin, LCSW Phone Number: 08/17/2022, 8:33 AM  Clinical Narrative:    Patient transferred to [facility] CSW to continue to follow for medical appropriateness for SNF referral.    Expected Discharge Plan: Skilled Nursing Facility Barriers to Discharge: Continued Medical Work up, English as a second language teacher, SNF Pending bed offer  Expected Discharge Plan and Services In-house Referral: Clinical Social Work                                             Social Determinants of Health (SDOH) Interventions SDOH Screenings   Food Insecurity: No Food Insecurity (07/30/2022)  Housing: Low Risk  (07/30/2022)  Transportation Needs: No Transportation Needs (07/30/2022)  Utilities: Not At Risk (07/30/2022)  Tobacco Use: Low Risk  (08/08/2022)    Readmission Risk Interventions    07/30/2022    2:44 PM  Readmission Risk Prevention Plan  Transportation Screening Complete  PCP or Specialist Appt within 3-5 Days Complete  HRI or Home Care Consult Complete  Social Work Consult for Recovery Care Planning/Counseling Complete  Palliative Care Screening Not Applicable  Medication Review Oceanographer) Complete

## 2022-08-17 NOTE — Evaluation (Signed)
Clinical/Bedside Swallow Evaluation Patient Details  Name: Tyler [name] MRN: [id number] Date of Birth: 12-25-1950  Today's Date: 08/17/2022 Time: SLP Start Time (ACUTE ONLY): 1301 SLP Stop Time (ACUTE ONLY): 1318 SLP Time Calculation (min) (ACUTE ONLY): 17 min  Past Medical History:  Past Medical History:  Diagnosis Date   Cancer (HCC)    prostate   Follicular lymphoma (HCC) 2024   History of pulmonary embolism    Hypertension    Myelodysplastic syndrome (HCC)    PAF (paroxysmal atrial fibrillation) (HCC)    Recurrent UTI    Past Surgical History:  Past Surgical History:  Procedure Laterality Date   LYMPHADENECTOMY Bilateral 09/01/2019   Procedure: LYMPHADENECTOMY;  Surgeon: Tyler Ache, MD;  Location: [facility];  Service: Urology;  Laterality: Bilateral;   ROBOT ASSISTED LAPAROSCOPIC RADICAL PROSTATECTOMY N/A 09/01/2019   Procedure: XI ROBOTIC ASSISTED LAPAROSCOPIC RADICAL PROSTATECTOMY;  Surgeon: Tyler Ache, MD;  Location: [facility];  Service: Urology;  Laterality: N/A;  3 HRS   HPI:  72 y.o. male admitted to [facility] on 07/29/22 with sepsis, generalized lymphadenopathy. 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 Hospitalization marked by swallow eval 6/8 with recs for regular diet/thin liquids; 6/10 NG for decompression, NPO; 6/13 worsening renal failure, intubated 6/13-6/16; CRRT 6/13-20.  Transferred to [facility] for HD 6/21.    Assessment / Plan / Recommendation  Clinical Impression  Pt's swallow function appears to be c/w assessment that was completed on 08/03/22.  Oral mechanism exam normal. NG present.  Pt allowed only minimal HOB elevation; bed moved into reverse trendelenberg to optimize positioning for eating. Pt accepted limited sips of water from a straw and had several bites of applesauce. He politely declined a cracker. There were no overt s/s of aspiration.  He fed himself with appropriate attention and oral control of purees/liquids.  Voice was clear and of good quality post-swallow.  Recommend resuming an oral diet - start with dysphagia 3/thin liquids.  Information forwarded to Dr. Thedore Mins. SLP will f/u briefly. SLP Visit Diagnosis: Dysphagia, unspecified (R13.10)           Diet Recommendation   Dysphagia 3/thin liquids  Medication Administration: Whole meds with puree    Other  Recommendations Oral Care Recommendations: Oral care BID    Recommendations for follow up therapy are one component of a multi-disciplinary discharge planning process, led by the attending physician.  Recommendations may be updated based on patient status, additional functional criteria and insurance authorization.  Follow up Recommendations No SLP follow up      Assistance Recommended at Discharge    Functional Status Assessment Patient has had a recent decline in their functional status and demonstrates the ability to make significant improvements in function in a reasonable and predictable amount of time.  Frequency and Duration min 1 x/week  1 week       Prognosis Prognosis for improved oropharyngeal function: Good      Swallow Study   General Date of Onset: 08/03/22 HPI: 72 y.o. male admitted to [facility] on 07/29/22 with sepsis, generalized lymphadenopathy. 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 Hospitalization marked by swallow eval 6/8 with recs for regular diet/thin liquids; 6/10 NG for decompression, NPO; 6/13 worsening renal failure, intubated 6/13-6/16; CRRT 6/13-20.  Transferred to [facility] for HD 6/21. Type of Study: Bedside Swallow Evaluation Previous  Swallow Assessment: 08/03/22 Diet Prior to this Study: NPO Temperature Spikes Noted: No Respiratory Status: Room air History of Recent Intubation: Yes Total duration of intubation  (days): 3 days Date extubated: 08/11/22 Behavior/Cognition: Alert;Pleasant mood;Cooperative Oral Cavity Assessment: Within Functional Limits Oral Care Completed by SLP: No Oral Cavity - Dentition: Adequate natural dentition Vision: Functional for self-feeding Self-Feeding Abilities: Able to feed self;Needs assist Patient Positioning: Partially reclined Baseline Vocal Quality: Normal Volitional Cough: Weak Volitional Swallow: Able to elicit    Oral/Motor/Sensory Function Overall Oral Motor/Sensory Function: Within functional limits   Ice Chips Ice chips: Not tested   Thin Liquid Thin Liquid: Within functional limits Presentation: Straw    Nectar Thick Nectar Thick Liquid: Not tested   Honey Thick Honey Thick Liquid: Not tested   Puree Puree: Within functional limits Presentation: Self Fed;Spoon   Solid     Solid: Not tested (pt declined)      [name] 08/17/2022,1:58 PM [name], MA CCC/SLP Clinical Specialist - Acute Care SLP Acute Rehabilitation Services Office number [phone]

## 2022-08-18 DIAGNOSIS — A419 Sepsis, unspecified organism: Secondary | ICD-10-CM | POA: Diagnosis not present

## 2022-08-18 DIAGNOSIS — R6520 Severe sepsis without septic shock: Secondary | ICD-10-CM | POA: Diagnosis not present

## 2022-08-18 LAB — CBC WITH DIFFERENTIAL/PLATELET
Abs Immature Granulocytes: 0.2 10*3/uL — ABNORMAL HIGH (ref 0.00–0.07)
Band Neutrophils: 28 %
Basophils Absolute: 0 10*3/uL (ref 0.0–0.1)
Basophils Relative: 0 %
Eosinophils Absolute: 0 10*3/uL (ref 0.0–0.5)
Eosinophils Relative: 0 %
HCT: 22.1 % — ABNORMAL LOW (ref 39.0–52.0)
Hemoglobin: 7.5 g/dL — ABNORMAL LOW (ref 13.0–17.0)
Lymphocytes Relative: 8 %
Lymphs Abs: 0.4 10*3/uL — ABNORMAL LOW (ref 0.7–4.0)
MCH: 29.1 pg (ref 26.0–34.0)
MCHC: 33.9 g/dL (ref 30.0–36.0)
MCV: 85.7 fL (ref 80.0–100.0)
Metamyelocytes Relative: 3 %
Monocytes Absolute: 0.4 10*3/uL (ref 0.1–1.0)
Monocytes Relative: 7 %
Myelocytes: 1 %
Neutro Abs: 4.4 10*3/uL (ref 1.7–7.7)
Neutrophils Relative %: 53 %
Platelets: 104 10*3/uL — ABNORMAL LOW (ref 150–400)
RBC: 2.58 MIL/uL — ABNORMAL LOW (ref 4.22–5.81)
RDW: 15.9 % — ABNORMAL HIGH (ref 11.5–15.5)
WBC: 5.4 10*3/uL (ref 4.0–10.5)
nRBC: 0 % (ref 0.0–0.2)

## 2022-08-18 LAB — BASIC METABOLIC PANEL
Anion gap: 9 (ref 5–15)
BUN: 66 mg/dL — ABNORMAL HIGH (ref 8–23)
CO2: 27 mmol/L (ref 22–32)
Calcium: 7.7 mg/dL — ABNORMAL LOW (ref 8.9–10.3)
Chloride: 97 mmol/L — ABNORMAL LOW (ref 98–111)
Creatinine, Ser: 3.68 mg/dL — ABNORMAL HIGH (ref 0.61–1.24)
GFR, Estimated: 17 mL/min — ABNORMAL LOW (ref >60)
Glucose, Bld: 113 mg/dL — ABNORMAL HIGH (ref 70–99)
Potassium: 5.1 mmol/L (ref 3.5–5.1)
Sodium: 133 mmol/L — ABNORMAL LOW (ref 135–145)

## 2022-08-18 LAB — GLUCOSE, CAPILLARY
Glucose-Capillary: 111 mg/dL — ABNORMAL HIGH (ref 70–99)
Glucose-Capillary: 130 mg/dL — ABNORMAL HIGH (ref 70–99)
Glucose-Capillary: 110 mg/dL — ABNORMAL HIGH (ref 70–99)
Glucose-Capillary: 125 mg/dL — ABNORMAL HIGH (ref 70–99)
Glucose-Capillary: 114 mg/dL — ABNORMAL HIGH (ref 70–99)
Glucose-Capillary: 93 mg/dL (ref 70–99)

## 2022-08-18 LAB — BRAIN NATRIURETIC PEPTIDE: B Natriuretic Peptide: 225.4 pg/mL — ABNORMAL HIGH (ref 0.0–100.0)

## 2022-08-18 LAB — TYPE AND SCREEN: ABO/RH(D): O POS

## 2022-08-18 LAB — MAGNESIUM: Magnesium: 1.9 mg/dL (ref 1.7–2.4)

## 2022-08-18 MED ORDER — FUROSEMIDE 10 MG/ML IJ SOLN
80.0000 mg | Freq: Once | INTRAMUSCULAR | Status: AC
  Administered 2022-08-18: 80 mg via INTRAVENOUS
  Filled 2022-08-18: qty 8

## 2022-08-18 NOTE — Progress Notes (Signed)
Inpatient Rehab Admissions Coordinator:  ? ?Per therapy recommendations,  patient was screened for CIR candidacy by Emrey Thornley, MS, CCC-SLP. At this time, Pt. Appears to be a a potential candidate for CIR. I will place   order for rehab consult per protocol for full assessment. Please contact me any with questions. ? ?Fatim Vanderschaaf, MS, CCC-SLP ?Rehab Admissions Coordinator  ?[phone] (celll) ?[phone] (office) ? ?

## 2022-08-18 NOTE — Progress Notes (Signed)
PROGRESS NOTE                                                                                                                                                                                                             Patient Demographics:    Tyler [name], is a 72 y.o. male, DOB - Dec 31, 1950, GNF:[id number]  Outpatient Primary MD for the patient is Nechama Guard, FNP    LOS - 20  Admit date - 07/29/2022    Chief Complaint  Patient presents with   Near Syncope   Loss of Consciousness       Brief Narrative (HPI from H&P)   72 yo male with history of PE, hypertension, paroxysmal atrial fibrillation, recurrent UTIs, low grade myelodysplastic syndrome found to have generalized lymphadenopathy from high grade large B cell lymphoma. Plan was to start on R-CHOP. Came to ER with near syncope. He had fever and hypotension with elevated lactic acid level. Started on ABx.  He had seizure like activity, and Rt facial paresthesia. He developed renal failure. Seen by cardiology for PAF. Had PICC placed and started on R mini CHOP on 08/05/22. Renal fx got worse and nephrology consulted. Had elevated uric acid with concern for tumor lysis syndrome and received rasburicase. Rituxan held because of this. Renal fx and mental status got worse. Concern for aspiration pneumonitis.  He was seen by the ICU team and kept in ICU, also seen by nephrology, oncology along with cardiology.  He was eventually started on CRRT and subsequently switched to HD treatments, he was transferred to my care on 08/17/2022 on day 19 of his hospital stay when he was switched from [facility] to [facility].    Significant Hospital Events: I   6/03 Presented to ED for fever, near-syncope. Presumed sepsis. Admitted to [facility]. 6/13 Worsening acute renal failure as evidenced by rising BUN/Cr, hyperkalemia to 5.6. PCCM consulted for ARF/need for RRT, line placement.  Intubated. R IJ Trialysis catheter placed. CRRT started. 6/15 transfuse 1 unit PRBC 6/16 extubate 6/17 no acute events overnight, remains on CRRT 6/18 no acute events overnight, remains on CRRT 6/19 Rituximab, CRRT 08/17/2022 started on HD 08/17/2022 transferred to hospitalist service under my care   Subjective:   Patient in bed, appears comfortable, denies any headache, no fever, no chest pain or pressure, no shortness  of breath , no abdominal pain. No new focal weakness.   Assessment  & Plan :   Aspiration pneumonia, sepsis, acute hypoxic respiratory failure with compromised airway.  Initially intubated kept in ICU, extubated on 08/11/2022, has finished antibiotics on 08/13/2022.  Currently on 2 L nasal cannula, still has NG tube ( DC 08/18/22), speech following.  Continue to monitor closely.  Sepsis with hypotension.  Required antibiotics and pressor support in ICU, now resolved.  Off of antibiotics and pressors.  Monitor.    Acute renal failure: Suspect multifactorial related to possible sepsis as well as contribution of tumor lysis syndrome given elevated uric acid etc. s/p rasburicase x 2, seen by nephrology, has right IJ HD catheter placed, initially underwent CRRT treatment now started on HD treatment on 08/17/2022.  Nephrology on board.  Paroxysmal A-fib: No AC due to history of bleeding - seen by cardiology, currently on amiodarone for rate control, not on anticoagulation due to high bleeding risk in the setting of pancytopenia.  Continue to monitor on telemetry.  Stable recent TSH.   High-grade Follicular B cell lymphoma.  Being followed by hematology, s/p mini CHOP treatment on 08/06/2022, complicated by severe pancytopenia, tumor lysis syndrome with AKI. Also received Rituxan this admission.  Defer further management to oncology service.  Pancytopenia:  -- Transfused 1 unit PRBC on 08/17/22 hemoglobin goal greater than 7.5, platelets counts gradually rising, neutropenia and leukopenia  improving with Granix.  Defer management to hematology and oncology team.   Tumor lysis syndrome: Continue allopurinol and now also getting HD for renal failure.   Metabolic encephalopathy.  Improving.  Continue to monitor.  Generalized weakness and dysphagia.  Was on NG tube feeds, speech evaluation  >> D3 diet, discontinue core track tube 08/18/22 as patient complaining of discomfort.       Condition - Extremely Guarded  Family Communication  : daughter Karena Addison - [phone] - 08/18/22  Code Status : Full code  Consults  : PCCM, oncology, nephrology, cardiology  PUD Prophylaxis : PPI   Procedures  :            Disposition Plan  :    Status is: Inpatient   DVT Prophylaxis  :    Place and maintain sequential compression device Start: 08/07/22 0936 SCDs Start: 07/30/22 0108    Lab Results  Component Value Date   PLT 104 (L) 08/18/2022    Diet :  Diet Order             DIET DYS 3 Room service appropriate? Yes with Assist; Fluid consistency: Thin  Diet effective now                    Inpatient Medications  Scheduled Meds:  acetaminophen  650 mg Per Tube Once   allopurinol  100 mg Per Tube BID   amiodarone  200 mg Per Tube Daily   B-complex with vitamin C  1 tablet Per Tube Daily   Chlorhexidine Gluconate Cloth  6 each Topical Q0600   insulin aspart  0-6 Units Subcutaneous Q4H   mouth rinse  15 mL Mouth Rinse 4 times per day   pantoprazole (PROTONIX) IV  40 mg Intravenous Daily   Tbo-filgastrim (GRANIX) SQ  480 mcg Subcutaneous q1800   Continuous Infusions:  sodium chloride Stopped (08/13/22 0021)   feeding supplement (VITAL 1.5 CAL) 60 mL/hr at 08/18/22 0157   PRN Meds:.docusate, ipratropium-albuterol, lip balm, melatonin, ondansetron **OR** ondansetron (ZOFRAN) IV, oxyCODONE, phenol, polyethylene glycol, polyvinyl  alcohol, senna, sodium chloride flush, traZODone    Objective:   Vitals:   08/17/22 1639 08/17/22 1904 08/17/22 2326 08/18/22 0440   BP: (!) 141/84 125/86 135/84 (!) 138/92  Pulse: 93 89 99 91  Resp: 19 20 20  (!) 21  Temp: 98.2 F (36.8 C) 98.2 F (36.8 C) 98.4 F (36.9 C) 98.2 F (36.8 C)  TempSrc: Oral Oral Oral Oral  SpO2: 97% 99% 99% 96%  Weight:      Height:        Wt Readings from Last 3 Encounters:  08/17/22 77.3 kg  07/19/22 84.3 kg  07/16/22 85.7 kg     Intake/Output Summary (Last 24 hours) at 08/18/2022 0736 Last data filed at 08/18/2022 0500 Gross per 24 hour  Intake 2072.33 ml  Output 3750 ml  Net -1677.67 ml     Physical Exam  Awake Alert, No new F.N deficits, Normal affect, right arm PICC line, right IJ dialysis catheter, core track tube [place].AT,PERRAL Supple Neck, No JVD,   Symmetrical Chest wall movement, Good air movement bilaterally, CTAB RRR,No Gallops,Rubs or new Murmurs,  +ve B.Sounds, Abd Soft, No tenderness,   No Cyanosis, Clubbing or edema       Data Review:    Recent Labs  Lab 08/14/22 0425 08/15/22 1315 08/15/22 2244 08/16/22 0423 08/17/22 0155 08/18/22 0436  WBC 0.5* 0.7*  --  1.0* 2.2* 5.4  HGB 7.9* 6.3* 7.6* 7.6* 7.2* 7.5*  HCT 25.3* 20.3* 24.1* 23.6* 22.7* 22.1*  PLT 28* 42*  --  53* 78* 104*  MCV 88.2 87.9  --  87.1 85.0 85.7  MCH 27.5 27.3  --  28.0 27.0 29.1  MCHC 31.2 31.0  --  32.2 31.7 33.9  RDW 16.3* 15.9*  --  15.7* 15.6* 15.9*  LYMPHSABS 0.3* 0.4*  --  0.4* 0.4* 0.4*  MONOABS 0.1 0.1  --  0.2 0.5 0.4  EOSABS 0.1 0.0  --  0.0 0.0 0.0  BASOSABS 0.0 0.0  --  0.0 0.0 0.0    Recent Labs  Lab 08/12/22 0515 08/12/22 1400 08/13/22 0300 08/13/22 1629 08/14/22 0425 08/14/22 1714 08/15/22 0504 08/15/22 1528 08/15/22 2244 08/16/22 0423 08/17/22 0805 08/18/22 0436  NA 137   < > 137   < > 136 135 135 135 136 133* 133* 133*  K 3.3*   < > 3.7   < > 4.0 4.3 4.3 4.4 4.8 5.0 5.8* 5.1  CL 101   < > 104   < > 104 105 105 106 103 101 101 97*  CO2 28   < > 28   < > 26 25 25 26 25 25 23 27   ANIONGAP 8   < > 5   < > 6 5 5  3* 8 7 9 9   GLUCOSE 114*   <  > 107*   < > 144* 155* 163* 144* 138* 117* 108* 113*  BUN 41*   < > 34*   < > 31* 34* 34* 37* 52* 60* 88* 66*  CREATININE 1.44*   < > 1.36*   < > 1.47* 1.60* 1.55* 1.65* 2.26* 2.59* 4.17* 3.68*  AST 31  --   --   --   --   --   --   --   --   --   --   --   ALT 31  --   --   --   --   --   --   --   --   --   --   --  ALKPHOS 79  --   --   --   --   --   --   --   --   --   --   --   BILITOT 2.5*  --   --   --   --   --   --   --   --   --   --   --   ALBUMIN 1.7*  1.6*   < > 1.8*   < > 1.9* 2.0* 1.8* 1.8*  --  1.8* 1.7*  --   BNP  --   --   --   --   --   --   --   --   --   --   --  225.4*  MG 2.3  --  2.5*  --  2.5*  --  2.3  --   --  2.4  --  1.9  CALCIUM 7.5*   < > 7.5*   < > 7.5* 7.4* 7.2* 7.2* 7.4* 7.4* 7.6* 7.7*   < > = values in this interval not displayed.   Lab Results  Component Value Date   TSH 3.619 08/01/2022    Recent Labs  Lab 08/13/22 0300 08/13/22 1629 08/14/22 0425 08/14/22 1714 08/15/22 0504 08/15/22 1528 08/15/22 2244 08/16/22 0423 08/17/22 0805 08/18/22 0436  BNP  --   --   --   --   --   --   --   --   --  225.4*  MG 2.5*  --  2.5*  --  2.3  --   --  2.4  --  1.9  CALCIUM 7.5*   < > 7.5*   < > 7.2* 7.2* 7.4* 7.4* 7.6* 7.7*   < > = values in this interval not displayed.      Micro Results Recent Results (from the past 240 hour(s))  Culture, Respiratory w Gram Stain     Status: None   Collection Time: 08/08/22 12:54 PM   Specimen: Tracheal Aspirate; Respiratory  Result Value Ref Range Status   Specimen Description   Final    TRACHEAL ASPIRATE Performed at [facility], [address] [place], [place] [address]    Special Requests   Final    NONE Performed at [facility], [address], [place], [place] [address]    Gram Stain   Final    NO WBC SEEN MODERATE GRAM POSITIVE RODS FEW GRAM POSITIVE COCCI IN CLUSTERS    Culture   Final    ABUNDANT Normal respiratory flora-no Staph aureus or Pseudomonas  seen Performed at [facility], [address] [place], [place] [address]    Report Status 08/10/2022 FINAL  Final    Radiology Reports DG Abd 1 View  Result Date: 08/16/2022 CLINICAL DATA:  Ileus. EXAM: ABDOMEN - 1 VIEW COMPARISON:  08/08/2022. FINDINGS: The bowel gas pattern is normal. An enteric tube terminates in the stomach. No radio-opaque calculi or other significant radiographic abnormality are seen. IMPRESSION: Nonobstructive bowel-gas pattern. Electronically Signed   By: Thornell Sartorius M.D.   On: 08/16/2022 04:56      Signature  -   Susa Raring M.D on 08/18/2022 at 7:36 AM   -  To page go to [email or web address]

## 2022-08-18 NOTE — Evaluation (Signed)
Physical Therapy Evaluation Patient Details Name: Tyler [name] MRN: [id number] DOB: [birth year] Today's Date: 08/18/2022  History of Present Illness  Pt is a 72 yo male who presented to [facility] 6/3 with poor po intake, abdominal pain, near syncope, fever, hypotension, and elevated lactic acid level. 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 While at [facility], pt had seizure like activity, Rt facial paresthesia, and developed renal failure. Seen by cardiology for PAF. Pt had PICC placed and started on R mini CHOP on 08/05/22. Renal fx got worse with elevated uric acid with concern for tumor lysis syndrome and received rasburicase. Renal fx and mental status got worse. Concern for aspiration pneumonitis. Pt kept in ICU and eventually started on CRRT and subsequently switched to HD treatments and transferred to [facility] on 6/22/202. Pt with PMH significant of PE, hypertension, paroxysmal atrial fibrillation, and recurrent UTIs.   Clinical Impression  New PT orders received 08/17/22 after pt last seen by PT 08/06/22. Pt presents with condition above and deficits mentioned below, see PT Problem List. PTA, he was mod I with intermittent utilization of a RW for mobility, living with his wife in a 1-level apartment with a flight to enter/exit. Currently, pt demonstrates great motivation to participate and improve. He displays deficits in gross strength (L weaker than R), balance, activity tolerance, and power. He is at high risk for falls, needing modA for transfers and to take a few steps with a RW today, displaying bil knee buckling. As pt has had a drastic functional decline, is highly motivated to improve, and can likely tolerate 3 hours of therapy a day, he could really benefit from intensive inpatient rehab, >3 hours/day. Will continue to follow acutely.     Recommendations for follow up therapy are one component of a multi-disciplinary  discharge planning process, led by the attending physician.  Recommendations may be updated based on patient status, additional functional criteria and insurance authorization.  Follow Up Recommendations Can patient physically be transported by private vehicle: No     Assistance Recommended at Discharge Intermittent Supervision/Assistance  Patient can return home with the following  Two people to help with walking and/or transfers;A little help with bathing/dressing/bathroom;Assistance with cooking/housework;Assist for transportation;Help with stairs or ramp for entrance    Equipment Recommendations Other (comment) (TBA)  Recommendations for Other Services  Rehab consult    Functional Status Assessment Patient has had a recent decline in their functional status and demonstrates the ability to make significant improvements in function in a reasonable and predictable amount of time.     Precautions / Restrictions Precautions Precautions: Fall Precaution Comments: L4 compression fracture, starting chemo, watch BP Restrictions Weight Bearing Restrictions: No      Mobility  Bed Mobility Overal bed mobility: Needs Assistance Bed Mobility: Supine to Sit     Supine to sit: HOB elevated, Min guard     General bed mobility comments: cues for hand placement and technique, with increased time, min guard for safety    Transfers Overall transfer level: Needs assistance Equipment used: Rolling walker (2 wheels) Transfers: Sit to/from Stand, Bed to chair/wheelchair/BSC Sit to Stand: Mod assist, +2 safety/equipment   Step pivot transfers: Mod assist, +2 safety/equipment       General transfer comment: Pt needing cues to scoot to edge and for placement of hands, modA to power up to stand multiple times from EOB to RW. ModA for balance and RW management  to step pivot to R bed > recliner. +2 for safety. Noted bil knee buckling but pt able to push through RW to recover     Ambulation/Gait Ambulation/Gait assistance: Mod assist, +2 safety/equipment Gait Distance (Feet): 2 Feet Assistive device: Rolling walker (2 wheels) Gait Pattern/deviations: Decreased step length - right, Decreased step length - left, Decreased stride length, Knees buckling, Trunk flexed Gait velocity: reduced Gait velocity interpretation: <1.31 ft/sec, indicative of household ambulator   General Gait Details: Pt takes slow, small, unsteady steps with bil knees buckling and pt heavily relying on UEs to push on RW to recover with knees buckling, modA for balance and RW management, +2 for safety  Stairs            Wheelchair Mobility    Modified Rankin (Stroke Patients Only)       Balance Overall balance assessment: Needs assistance Sitting-balance support: Single extremity supported, Bilateral upper extremity supported, Feet supported, No upper extremity supported Sitting balance-Leahy Scale: Fair   Postural control: Posterior lean (when moving Bilateral LE) Standing balance support: Bilateral upper extremity supported, During functional activity, Reliant on assistive device for balance Standing balance-Leahy Scale: Poor Standing balance comment: reliant on RW and modA                             Pertinent Vitals/Pain Pain Assessment Pain Assessment: Faces Faces Pain Scale: Hurts a little bit Pain Location: Generalized with movement Pain Descriptors / Indicators: Grimacing Pain Intervention(s): Limited activity within patient's tolerance, Monitored during session, Repositioned    Home Living Family/patient expects to be discharged to:: Private residence Living Arrangements: Spouse/significant other Available Help at Discharge: Family Type of Home: Apartment (apartment on the 2nd floor) Home Access: Stairs to enter Entrance Stairs-Rails: Doctor, general practice of Steps: flight   Home Layout: One level Home Equipment: Agricultural consultant (2  wheels);BSC/3in1      Prior Function Prior Level of Function : Independent/Modified Independent;Working/employed             Mobility Comments: Per chart review, Independent to Mod I with occasional use of RW. Pt works. ADLs Comments: Per chart review, largely Independent to Mod I with occasional assistance from spouse.     Hand Dominance   Dominant Hand: Left    Extremity/Trunk Assessment   Upper Extremity Assessment Upper Extremity Assessment: Defer to OT evaluation RUE Deficits / Details: Generalized weakness with overall RU E strength 4/5 RUE Sensation: WNL RUE Coordination: WNL LUE Deficits / Details: Generalized weakness with overall strength 4-/5 LUE Sensation: decreased proprioception LUE Coordination: decreased fine motor    Lower Extremity Assessment Lower Extremity Assessment: LLE deficits/detail;Generalized weakness;RLE deficits/detail RLE Deficits / Details: MMT scores grossly of 4+; denied numbness/tingling LLE Deficits / Details: MMT scores gorssly of 4- to 4; denied numbness/tingling    Cervical / Trunk Assessment Cervical / Trunk Assessment: Other exceptions Cervical / Trunk Exceptions: Recent subacute L4 compression fracture  Communication   Communication: Prefers language other than English (Swahili; Speaks some [place])  Cognition Arousal/Alertness: Awake/alert Behavior During Therapy: WFL for tasks assessed/performed Overall Cognitive Status: Difficult to assess                                 General Comments: Pt requiring increased time for processing. Pt demonstrates ability to follow 1 step commands consistantly with increased time and visual cues.  General Comments General comments (skin integrity, edema, etc.): Pt reported dizziness in sitting and standing. BP- 144/88 supine, 128/94 sitting, 139/84 sitting after standing; Pt participated well and was pleasant throughout. Pt's wife present during session    Exercises  General Exercises - Lower Extremity Long Arc Quad: AROM, Strengthening, Both, 5 reps, Seated Hip Flexion/Marching: AROM, Strengthening, Both, 10 reps, Standing (with RW and modA for balance) Other Exercises Other Exercises: IS and flutter valve x5 reps supine in bed   Assessment/Plan    PT Assessment Patient needs continued PT services  PT Problem List Decreased strength;Decreased activity tolerance;Decreased balance;Decreased mobility;Pain;Decreased knowledge of use of DME       PT Treatment Interventions DME instruction;Gait training;Functional mobility training;Therapeutic activities;Therapeutic exercise;Patient/family education;Balance training;Neuromuscular re-education;Stair training    PT Goals (Current goals can be found in the Care Plan section)  Acute Rehab PT Goals Patient Stated Goal: to get stronger PT Goal Formulation: With patient/family Time For Goal Achievement: 09/01/22 Potential to Achieve Goals: Good    Frequency Min 3X/week     Co-evaluation PT/OT/SLP Co-Evaluation/Treatment: Yes Reason for Co-Treatment: For patient/therapist safety;To address functional/ADL transfers PT goals addressed during session: Mobility/safety with mobility;Balance;Proper use of DME OT goals addressed during session: ADL's and self-care       AM-PAC PT "6 Clicks" Mobility  Outcome Measure Help needed turning from your back to your side while in a flat bed without using bedrails?: A Little Help needed moving from lying on your back to sitting on the side of a flat bed without using bedrails?: A Little Help needed moving to and from a bed to a chair (including a wheelchair)?: A Lot Help needed standing up from a chair using your arms (e.g., wheelchair or bedside chair)?: A Lot Help needed to walk in hospital room?: Total Help needed climbing 3-5 steps with a railing? : Total 6 Click Score: 12    End of Session Equipment Utilized During Treatment: Gait belt Activity Tolerance:  Patient tolerated treatment well Patient left: in chair;with call bell/phone within reach;with chair alarm set Nurse Communication: Mobility status;Need for lift equipment PT Visit Diagnosis: Other abnormalities of gait and mobility (R26.89);Difficulty in walking, not elsewhere classified (R26.2);Unsteadiness on feet (R26.81);Muscle weakness (generalized) (M62.81)    Time: 0981-1914 PT Time Calculation (min) (ACUTE ONLY): 35 min   Charges:   PT Evaluation $PT Re-evaluation: 1 Re-eval          Raymond Gurney, PT, DPT Acute Rehabilitation Services  Office: [phone]   Jewel Baize 08/18/2022, 2:14 PM

## 2022-08-18 NOTE — Progress Notes (Signed)
[name] Progress Note   Assessment/ Plan:   AKI - b/l creatinine 0.6- 0.9 here on admission. Creat increased up to 1.3 on 6/10 and 2.3 on 6/11.  UA showed ^wbc, min rbc's/ protein. Renal US showed no obstruction. 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 Uric acid/ hyperkalemia peaked and now have recovered w/ CRRT and rasburicase. CRRT started 6/13. No signs of renal recovery.  - CRRT 6/14-6/20  - HD 08/17/22, next HD 08/19/22  - UF as tolerated  - has nontunneled HD cath- will need to convert to tunneled at some point if no   Renal recovery  - IV Lasix challenge today Tumor lysis syndrome - sp rasburicase 6mg  on 6/12 and another 6mg  on 6/13. Now < 1.5, last checked 6/20.  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 Ritux 6/19 Parox atrial fib- on amiodarone Abd pain/ distention- KUB negative Dispo: pending  Subjective:    HD yesterday, 1.6L removed.  Did well, feeling "OK" this AM.     Objective:   BP (!) 138/92 (BP Location: Left Arm)   Pulse 91   Temp 98.2 F (36.8 C) (Oral)   Resp (!) 21   Ht 5\' 7"  (1.702 m)   Wt 77.3 kg   SpO2 96%   BMI 26.69 kg/m   Physical Exam: Gen:NAD, lying in bed CVS: RRR Resp: clear Abd: soft, somewhat distended Ext: trace LE edema, UE edema much improved ACCESS: R internal jugular nontunneled HD catheter  Labs: BMET Recent Labs  Lab 08/13/22 1629 08/14/22 0425 08/14/22 1714 08/15/22 0504 08/15/22 1528 08/15/22 2244 08/16/22 0423 08/17/22 0805 08/18/22 0436  NA 136 136 135 135 135 136 133* 133* 133*  K 3.9 4.0 4.3 4.3 4.4 4.8 5.0 5.8* 5.1  CL 105 104 105 105 106 103 101 101 97*  CO2 26 26 25 25 26 25 25 23 27   GLUCOSE 140* 144* 155* 163* 144* 138* 117* 108* 113*  BUN 29* 31* 34* 34* 37* 52* 60* 88* 66*  CREATININE 1.39* 1.47* 1.60* 1.55* 1.65* 2.26* 2.59* 4.17* 3.68*  CALCIUM 7.5* 7.5* 7.4* 7.2* 7.2* 7.4* 7.4* 7.6* 7.7*  PHOS 2.1*  1.7* 1.5* 3.0 2.3*  --  3.0 3.9  --    CBC Recent Labs  Lab 08/15/22 1315 08/15/22 2244 08/16/22 0423 08/17/22 0155 08/18/22 0436  WBC 0.7*  --  1.0* 2.2* 5.4  NEUTROABS 0.2*  --  0.3* 1.2* 4.4  HGB 6.3* 7.6* 7.6* 7.2* 7.5*  HCT 20.3* 24.1* 23.6* 22.7* 22.1*  MCV 87.9  --  87.1 85.0 85.7  PLT 42*  --  53* 78* 104*      Medications:     acetaminophen  650 mg Per Tube Once   allopurinol  100 mg Per Tube BID   amiodarone  200 mg Per Tube Daily   B-complex with vitamin C  1 tablet Per Tube Daily   Chlorhexidine Gluconate Cloth  6 each Topical Q0600   furosemide  80 mg Intravenous Once   insulin aspart  0-6 Units Subcutaneous Q4H   mouth rinse  15 mL Mouth Rinse 4 times per day   pantoprazole (PROTONIX) IV  40 mg Intravenous Daily   Tbo-filgastrim (GRANIX) SQ  480 mcg Subcutaneous q1800     Bufford Buttner, MD 08/18/2022, 8:03 AM

## 2022-08-18 NOTE — Plan of Care (Signed)
  Problem: Education: Goal: Knowledge of General Education information will improve Description: Including pain rating scale, medication(s)/side effects and non-pharmacologic comfort measures Outcome: Progressing   Problem: Health Behavior/Discharge Planning: Goal: Ability to manage health-related needs will improve Outcome: Progressing   Problem: Clinical Measurements: Goal: Ability to maintain clinical measurements within normal limits will improve Outcome: Progressing Goal: Will remain free from infection Outcome: Progressing Goal: Diagnostic test results will improve Outcome: Progressing Goal: Respiratory complications will improve Outcome: Progressing Goal: Cardiovascular complication will be avoided Outcome: Progressing   Problem: Activity: Goal: Risk for activity intolerance will decrease Outcome: Progressing   Problem: Nutrition: Goal: Adequate nutrition will be maintained Outcome: Progressing   Problem: Coping: Goal: Level of anxiety will decrease Outcome: Progressing   Problem: Elimination: Goal: Will not experience complications related to bowel motility Outcome: Progressing Goal: Will not experience complications related to urinary retention Outcome: Progressing   Problem: Pain Managment: Goal: General experience of comfort will improve Outcome: Progressing   Problem: Safety: Goal: Ability to remain free from injury will improve Outcome: Progressing   Problem: Skin Integrity: Goal: Risk for impaired skin integrity will decrease Outcome: Progressing   Problem: Education: Goal: Ability to describe self-care measures that may prevent or decrease complications (Diabetes Survival Skills Education) will improve Outcome: Progressing Goal: Individualized Educational Video(s) Outcome: Progressing   Problem: Coping: Goal: Ability to adjust to condition or change in health will improve Outcome: Progressing   Problem: Fluid Volume: Goal: Ability to  maintain a balanced intake and output will improve Outcome: Progressing   Problem: Health Behavior/Discharge Planning: Goal: Ability to identify and utilize available resources and services will improve Outcome: Progressing Goal: Ability to manage health-related needs will improve Outcome: Progressing   Problem: Metabolic: Goal: Ability to maintain appropriate glucose levels will improve Outcome: Progressing   Problem: Nutritional: Goal: Maintenance of adequate nutrition will improve Outcome: Progressing Goal: Progress toward achieving an optimal weight will improve Outcome: Progressing   Problem: Skin Integrity: Goal: Risk for impaired skin integrity will decrease Outcome: Progressing   Problem: Tissue Perfusion: Goal: Adequacy of tissue perfusion will improve Outcome: Progressing   Problem: Fluid Volume: Goal: Hemodynamic stability will improve Outcome: Progressing   Problem: Clinical Measurements: Goal: Diagnostic test results will improve Outcome: Progressing Goal: Signs and symptoms of infection will decrease Outcome: Progressing   Problem: Respiratory: Goal: Ability to maintain adequate ventilation will improve Outcome: Progressing   Problem: Education: Goal: Knowledge of condition and prescribed therapy will improve Outcome: Progressing   Problem: Cardiac: Goal: Will achieve and/or maintain adequate cardiac output Outcome: Progressing   Problem: Physical Regulation: Goal: Complications related to the disease process, condition or treatment will be avoided or minimized Outcome: Progressing

## 2022-08-18 NOTE — Evaluation (Signed)
Occupational Therapy Evaluation Patient Details Name: Tyler [name] MRN: [id number] DOB: 1950-10-24 Today's Date: 08/18/2022   History of Present Illness Pt is a 72 yo male who presented to [facility] 6/3 with poor po intake, abdominal pain, near syncope, fever, hypotension, and elevated lactic acid level. 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 While at [facility], pt had seizure like activity, Rt facial paresthesia, and developed renal failure. Seen by cardiology for PAF. Pt had PICC placed and started on R mini CHOP on 08/05/22. Renal fx got worse with elevated uric acid with concern for tumor lysis syndrome and received rasburicase. Renal fx and mental status got worse. Concern for aspiration pneumonitis. Pt kept in ICU and eventually started on CRRT and subsequently switched to HD treatments and transferred to [facility] on 6/22/202. Pt with PMH significant of PE, hypertension, paroxysmal atrial fibrillation, and recurrent UTIs.   Clinical Impression   Pt reevaluated for skilled OT services after transferring from [facility] to [facility]. At baseline, per chart review, pt is largely Independent to Mod I for ADLs with occasional assistance from wife, completes functional mobility Independent to Mod I with occasional use of RW, and works. Pt now presents with decreased activity tolerance, general B UE weakness (worse on Left), decreased L UE coordination, decreased sitting and standing balance during functional tasks, and decreased safety and independence with ADLs and functional transfers/mobility. PT currently demonstrates the ability to complete UB ADLS with Set up to Mod assist, LB ADLs with Max assist +2 for safety, bed mobility with Mod assist, and functional transfers with a RW with Mod assist +2 for safety. Pt requires increased time and cues for safety, sequencing, and technique throughout functional tasks. Pt participated well in skilled OT  re-evaluation and is making good progress toward OT goals established at [facility]. OT goals and discharge recommendation updated this day. Pt will benefit from continued acute skilled OT services to address deficits outlined below, decrease caregiver burden, and increase safety and independence with ADLs and functional transfers/mobility. Post acute discharge, pt will benefit from intensive inpatient skilled OT services > 3 hours per day to maximize rehab potential.      Recommendations for follow up therapy are one component of a multi-disciplinary discharge planning process, led by the attending physician.  Recommendations may be updated based on patient status, additional functional criteria and insurance authorization.   Assistance Recommended at Discharge Frequent or constant Supervision/Assistance  Patient can return home with the following Two people to help with walking and/or transfers;Two people to help with bathing/dressing/bathroom;Assistance with cooking/housework;Assistance with feeding;Assist for transportation;Help with stairs or ramp for entrance (Set up for self feeding)    Functional Status Assessment  Patient has had a recent decline in their functional status and demonstrates the ability to make significant improvements in function in a reasonable and predictable amount of time.  Equipment Recommendations  Other (comment) (Defer to next level of care)    Recommendations for Other Services Rehab consult     Precautions / Restrictions Precautions Precautions: Fall Restrictions Weight Bearing Restrictions: No      Mobility Bed Mobility Overal bed mobility: Needs Assistance Bed Mobility: Supine to Sit     Supine to sit: Mod assist, HOB elevated     General bed mobility comments: cues for hand placement and technique, with increased time    Transfers Overall transfer level: Needs assistance Equipment used: Rolling walker (2 wheels) Transfers: Sit to/from  Stand, Bed to  chair/wheelchair/BSC Sit to Stand: Mod assist, +2 safety/equipment     Step pivot transfers: Mod assist, +2 safety/equipment (with RW)     General transfer comment: with increased time, cues for technique, and mod encouragement      Balance Overall balance assessment: Needs assistance Sitting-balance support: Single extremity supported, Bilateral upper extremity supported, Feet supported, No upper extremity supported Sitting balance-Leahy Scale: Fair   Postural control: Posterior lean (when moving Bilateral LE) Standing balance support: Bilateral upper extremity supported, During functional activity, Reliant on assistive device for balance Standing balance-Leahy Scale: Poor                             ADL either performed or assessed with clinical judgement   ADL Overall ADL's : Needs assistance/impaired Eating/Feeding: Set up;Sitting   Grooming: Min guard;Cueing for sequencing;Cueing for safety;Sitting   Upper Body Bathing: Moderate assistance;Sitting;Cueing for safety;Cueing for sequencing;Cueing for compensatory techniques   Lower Body Bathing: Maximal assistance;Sitting/lateral leans;+2 for safety/equipment;Cueing for safety;Cueing for sequencing;Cueing for compensatory techniques   Upper Body Dressing : Moderate assistance;Sitting;Cueing for safety;Cueing for sequencing   Lower Body Dressing: Maximal assistance;Sitting/lateral leans;+2 for safety/equipment;Cueing for safety;Cueing for sequencing;Cueing for compensatory techniques   Toilet Transfer: Moderate assistance;+2 for safety/equipment;Cueing for safety;Cueing for sequencing;Stand-pivot;BSC/3in1;Rolling walker (2 wheels)   Toileting- Clothing Manipulation and Hygiene: Maximal assistance;+2 for safety/equipment;Sit to/from stand;Sitting/lateral lean         General ADL Comments: Functional mobility deferred this day for pt/therapist safety.     Vision Baseline Vision/History: 0 No visual  deficits Ability to See in Adequate Light: 0 Adequate Patient Visual Report: No change from baseline       Perception     Praxis Praxis Praxis tested?: Deficits Deficits: Organization Praxis-Other Comments: Sequencing deficits    Pertinent Vitals/Pain Pain Assessment Pain Assessment: Faces Faces Pain Scale: Hurts a little bit Pain Location: Generalized with movement Pain Descriptors / Indicators: Grimacing Pain Intervention(s): Limited activity within patient's tolerance, Monitored during session, Repositioned     Hand Dominance Left   Extremity/Trunk Assessment Upper Extremity Assessment Upper Extremity Assessment: Generalized weakness;RUE deficits/detail;LUE deficits/detail RUE Deficits / Details: Generalized weakness with overall RU E strength 4/5 RUE Sensation: WNL RUE Coordination: WNL LUE Deficits / Details: Generalized weakness with overall strength 4-/5 LUE Sensation: decreased proprioception LUE Coordination: decreased fine motor   Lower Extremity Assessment Lower Extremity Assessment: Defer to PT evaluation   Cervical / Trunk Assessment Cervical / Trunk Assessment: Other exceptions Cervical / Trunk Exceptions: Recent subacute L4 compression fracture   Communication Communication Communication: Prefers language other than English (Swahili; Speaks some [place])   Cognition Arousal/Alertness: Awake/alert Behavior During Therapy: WFL for tasks assessed/performed Overall Cognitive Status: Difficult to assess                                 General Comments: Pt requiring increased time for processing. Pt demonstrates ability to follow 1 step commands consistantly with increased time and visual cues.     General Comments  Pt reproted dizziness in sitting and standing, but no episodes of orthostatic hypotention noted this session. PT BP in supine was 144/88, 128/94 in sitting, and 139/84 in stting following stand and step-pivot transfers from bed to  recliner. Pt participated well and was pleasant throughout. Pt's wife present during skilled OT evaluation.    Exercises     Shoulder Instructions  Home Living Family/patient expects to be discharged to:: Private residence Living Arrangements: Spouse/significant other Available Help at Discharge: Family Type of Home: Apartment (apartment on the 2nd floor) Home Access: Stairs to enter Secretary/administrator of Steps: flight Entrance Stairs-Rails: Right;Left Home Layout: One level     Bathroom Shower/Tub: Chief Strategy Officer: Standard Bathroom Accessibility: Yes   Home Equipment: Agricultural consultant (2 wheels);BSC/3in1          Prior Functioning/Environment Prior Level of Function : Independent/Modified Independent;Working/employed             Mobility Comments: Per chart review, Independent to Mod I with occasional use of RW. Pt works. ADLs Comments: Per chart review, largely Independent to Mod I with occasional assistance from spouse.        OT Problem List: Decreased strength;Decreased activity tolerance;Impaired balance (sitting and/or standing);Decreased coordination;Decreased knowledge of use of DME or AE      OT Treatment/Interventions: Self-care/ADL training;Therapeutic exercise;DME and/or AE instruction;Therapeutic activities;Patient/family education;Balance training    OT Goals(Current goals can be found in the care plan section) Acute Rehab OT Goals Patient Stated Goal: Get stronger OT Goal Formulation: With patient/family Time For Goal Achievement: 09/01/22 Potential to Achieve Goals: Good ADL Goals Pt Will Perform Grooming: with min guard assist;standing Pt Will Perform Upper Body Bathing: with min guard assist;sitting Pt Will Perform Lower Body Bathing: sitting/lateral leans;with min assist;sit to/from stand Pt Will Transfer to Toilet: with min guard assist;bedside commode;ambulating (with least restrictive AD) Pt/caregiver will  Perform Home Exercise Program: Increased strength;Both right and left upper extremity;With theraband;With theraputty;With Supervision;With written HEP provided  OT Frequency: Min 2X/week    Co-evaluation PT/OT/SLP Co-Evaluation/Treatment: Yes Reason for Co-Treatment: For patient/therapist safety PT goals addressed during session: Mobility/safety with mobility;Balance OT goals addressed during session: ADL's and self-care      AM-PAC OT "6 Clicks" Daily Activity     Outcome Measure Help from another person eating meals?: A Little Help from another person taking care of personal grooming?: A Little Help from another person toileting, which includes using toliet, bedpan, or urinal?: A Lot Help from another person bathing (including washing, rinsing, drying)?: A Lot Help from another person to put on and taking off regular upper body clothing?: A Lot Help from another person to put on and taking off regular lower body clothing?: A Lot 6 Click Score: 14   End of Session Equipment Utilized During Treatment: Gait belt;Rolling walker (2 wheels) Nurse Communication: Mobility status  Activity Tolerance: Patient tolerated treatment well Patient left: in chair;with call bell/phone within reach;with chair alarm set;with family/visitor present  OT Visit Diagnosis: Unsteadiness on feet (R26.81);Muscle weakness (generalized) (M62.81)                Time: 6295-2841 OT Time Calculation (min): 28 min Charges:  OT General Charges $OT Visit: 1 Visit OT Evaluation $OT Eval Moderate Complexity: 1 Mod  [address] OTR/L, MA Acute Rehab [phone]   Lendon Colonel 08/18/2022, 1:17 PM

## 2022-08-19 ENCOUNTER — Encounter (HOSPITAL_COMMUNITY): Payer: Self-pay | Admitting: Internal Medicine

## 2022-08-19 DIAGNOSIS — R6520 Severe sepsis without septic shock: Secondary | ICD-10-CM | POA: Diagnosis not present

## 2022-08-19 DIAGNOSIS — A419 Sepsis, unspecified organism: Secondary | ICD-10-CM | POA: Diagnosis not present

## 2022-08-19 LAB — BASIC METABOLIC PANEL
Anion gap: 12 (ref 5–15)
BUN: 81 mg/dL — ABNORMAL HIGH (ref 8–23)
CO2: 25 mmol/L (ref 22–32)
Calcium: 8.1 mg/dL — ABNORMAL LOW (ref 8.9–10.3)
Chloride: 95 mmol/L — ABNORMAL LOW (ref 98–111)
Creatinine, Ser: 4.9 mg/dL — ABNORMAL HIGH (ref 0.61–1.24)
GFR, Estimated: 12 mL/min — ABNORMAL LOW (ref >60)
Glucose, Bld: 88 mg/dL (ref 70–99)
Potassium: 5.4 mmol/L — ABNORMAL HIGH (ref 3.5–5.1)
Sodium: 132 mmol/L — ABNORMAL LOW (ref 135–145)

## 2022-08-19 LAB — CBC WITH DIFFERENTIAL/PLATELET
Abs Immature Granulocytes: 0 10*3/uL (ref 0.00–0.07)
Basophils Absolute: 0 10*3/uL (ref 0.0–0.1)
Basophils Relative: 0 %
Eosinophils Absolute: 0 10*3/uL (ref 0.0–0.5)
Eosinophils Relative: 0 %
HCT: 21.4 % — ABNORMAL LOW (ref 39.0–52.0)
Hemoglobin: 7 g/dL — ABNORMAL LOW (ref 13.0–17.0)
Lymphocytes Relative: 4 %
Lymphs Abs: 0.6 10*3/uL — ABNORMAL LOW (ref 0.7–4.0)
MCH: 28.3 pg (ref 26.0–34.0)
MCHC: 32.7 g/dL (ref 30.0–36.0)
MCV: 86.6 fL (ref 80.0–100.0)
Monocytes Absolute: 0.3 10*3/uL (ref 0.1–1.0)
Monocytes Relative: 2 %
Neutro Abs: 13.4 10*3/uL — ABNORMAL HIGH (ref 1.7–7.7)
Neutrophils Relative %: 94 %
Platelets: 131 10*3/uL — ABNORMAL LOW (ref 150–400)
RBC: 2.47 MIL/uL — ABNORMAL LOW (ref 4.22–5.81)
RDW: 16.2 % — ABNORMAL HIGH (ref 11.5–15.5)
WBC: 14.3 10*3/uL — ABNORMAL HIGH (ref 4.0–10.5)
nRBC: 0 % (ref 0.0–0.2)
nRBC: 0 /100 WBC

## 2022-08-19 LAB — TYPE AND SCREEN

## 2022-08-19 LAB — GLUCOSE, CAPILLARY
Glucose-Capillary: 81 mg/dL (ref 70–99)
Glucose-Capillary: 81 mg/dL (ref 70–99)
Glucose-Capillary: 92 mg/dL (ref 70–99)
Glucose-Capillary: 113 mg/dL — ABNORMAL HIGH (ref 70–99)

## 2022-08-19 LAB — MAGNESIUM: Magnesium: 2.1 mg/dL (ref 1.7–2.4)

## 2022-08-19 LAB — BPAM RBC: ISSUE DATE / TIME: 202406240900

## 2022-08-19 LAB — PREPARE RBC (CROSSMATCH)

## 2022-08-19 LAB — BRAIN NATRIURETIC PEPTIDE: B Natriuretic Peptide: 316.2 pg/mL — ABNORMAL HIGH (ref 0.0–100.0)

## 2022-08-19 MED ORDER — SODIUM CHLORIDE 0.9 % IV BOLUS
500.0000 mL | Freq: Once | INTRAVENOUS | Status: AC
  Administered 2022-08-19: 500 mL via INTRAVENOUS

## 2022-08-19 MED ORDER — HEPARIN SODIUM (PORCINE) 1000 UNIT/ML IJ SOLN
INTRAMUSCULAR | Status: AC
  Administered 2022-08-19: 2400 [IU]
  Filled 2022-08-19: qty 3

## 2022-08-19 MED ORDER — SODIUM CHLORIDE 0.9% IV SOLUTION: Freq: Once | INTRAVENOUS | Status: AC

## 2022-08-19 MED ORDER — SODIUM CHLORIDE 0.9 % IV SOLN: INTRAVENOUS | Status: DC

## 2022-08-19 NOTE — Progress Notes (Signed)
PROGRESS NOTE                                                                                                                                                                                                             Patient Demographics:    Tyler [name], is a 72 y.o. male, DOB - [birth year], ZOX:[id number]  Outpatient Primary MD for the patient is Nechama Guard, FNP    LOS - 21  Admit date - 07/29/2022    Chief Complaint  Patient presents with   Near Syncope   Loss of Consciousness       Brief Narrative (HPI from H&P)   72 yo male with history of PE, hypertension, paroxysmal atrial fibrillation, recurrent UTIs, low grade myelodysplastic syndrome found to have generalized lymphadenopathy from high grade large B cell lymphoma. Plan was to start on R-CHOP. Came to ER with near syncope. He had fever and hypotension with elevated lactic acid level. Started on ABx.  He had seizure like activity, and Rt facial paresthesia. He developed renal failure. Seen by cardiology for PAF. Had PICC placed and started on R mini CHOP on 08/05/22. Renal fx got worse and nephrology consulted. Had elevated uric acid with concern for tumor lysis syndrome and received rasburicase. Rituxan held because of this. Renal fx and mental status got worse. Concern for aspiration pneumonitis.  He was seen by the ICU team and kept in ICU, also seen by nephrology, oncology along with cardiology.  He was eventually started on CRRT and subsequently switched to HD treatments, he was transferred to my care on 08/17/2022 on day 19 of his hospital stay when he was switched from [facility] to [facility].    Significant Hospital Events: I   6/03 Presented to ED for fever, near-syncope. Presumed sepsis. Admitted to [facility] 6/13 Worsening acute renal failure as evidenced by rising BUN/Cr, hyperkalemia to 5.6. PCCM consulted for ARF/need for RRT, line placement.  Intubated. R IJ Trialysis catheter placed. CRRT started. 6/15 transfuse 1 unit PRBC 6/16 extubate 6/17 no acute events overnight, remains on CRRT 6/18 no acute events overnight, remains on CRRT 6/19 Rituximab, CRRT 08/17/2022 started on HD 08/17/2022 transferred to hospitalist service under my care   Subjective:   Patient in bed, appears comfortable, denies any headache, no fever, no chest pain or pressure, no shortness  of breath , no abdominal pain. No new focal weakness.   Assessment  & Plan :   Aspiration pneumonia, sepsis, acute hypoxic respiratory failure with compromised airway.  Initially intubated kept in ICU, extubated on 08/11/2022, has finished antibiotics on 08/13/2022.  Currently on 2 L nasal cannula, NG tube DC'd on 08/18/22, speech following.  Continue to monitor closely.  Sepsis with hypotension.  Required antibiotics and pressor support in ICU, now resolved.  Off of antibiotics and pressors.  Monitor.    Acute renal failure: Suspect multifactorial related to possible sepsis as well as contribution of tumor lysis syndrome given elevated uric acid etc. s/p rasburicase x 2, seen by nephrology, has right IJ HD catheter placed, initially underwent CRRT treatment now started on HD treatment on 08/17/2022.  Nephrology on board.  Paroxysmal A-fib: No AC due to history of bleeding - seen by cardiology, currently on amiodarone for rate control, not on anticoagulation due to high bleeding risk in the setting of pancytopenia.  Continue to monitor on telemetry.  Stable recent TSH.   High-grade Follicular B cell lymphoma.  Being followed by hematology, s/p mini CHOP treatment on 08/06/2022, complicated by severe pancytopenia, tumor lysis syndrome with AKI. Also received Rituxan this admission.  Defer further management to oncology service.  Pancytopenia:  -- Transfused 1 unit PRBC on 08/17/22 & another on 08/19/22 hemoglobin goal greater than 7.5, platelets counts gradually rising, neutropenia and  leukopenia improved with Granix (stopped 08/18/22).  Defer further management to hematology and oncology team.   Tumor lysis syndrome: Continue allopurinol and now also getting HD for renal failure.   Metabolic encephalopathy.  Improving.  Continue to monitor.  Generalized weakness and dysphagia.  Was on NG tube feeds, speech evaluation  >> D3 diet, discontinued core track tube 08/18/22 as patient complaining of discomfort.       Condition - Extremely Guarded  Family Communication  : daughter Karena Addison - [phone] - 08/18/22  Code Status : Full code  Consults  : PCCM, oncology, nephrology, cardiology  PUD Prophylaxis : PPI   Procedures  :            Disposition Plan  :    Status is: Inpatient   DVT Prophylaxis  :    Place and maintain sequential compression device Start: 08/07/22 0936 SCDs Start: 07/30/22 0108    Lab Results  Component Value Date   PLT 131 (L) 08/19/2022    Diet :  Diet Order             DIET DYS 3 Room service appropriate? Yes with Assist; Fluid consistency: Thin  Diet effective now                    Inpatient Medications  Scheduled Meds:  sodium chloride   Intravenous Once   acetaminophen  650 mg Per Tube Once   allopurinol  100 mg Per Tube BID   amiodarone  200 mg Per Tube Daily   B-complex with vitamin C  1 tablet Per Tube Daily   Chlorhexidine Gluconate Cloth  6 each Topical Q0600   insulin aspart  0-6 Units Subcutaneous Q4H   mouth rinse  15 mL Mouth Rinse 4 times per day   pantoprazole (PROTONIX) IV  40 mg Intravenous Daily   Continuous Infusions:  sodium chloride Stopped (08/13/22 0021)   feeding supplement (VITAL 1.5 CAL) 60 mL/hr at 08/18/22 0157   PRN Meds:.docusate, ipratropium-albuterol, lip balm, melatonin, ondansetron **OR** ondansetron (ZOFRAN) IV, oxyCODONE, phenol,  polyethylene glycol, polyvinyl alcohol, senna, sodium chloride flush, traZODone    Objective:   Vitals:   08/18/22 2327 08/19/22 0354 08/19/22  0400 08/19/22 0741  BP: 124/79 137/79 130/83 125/82  Pulse: 96 93 90 89  Resp: 20 (!) 23 17 (!) 22  Temp: 97.9 F (36.6 C) 98.2 F (36.8 C) 98 F (36.7 C) 98.1 F (36.7 C)  TempSrc: Oral Oral Oral   SpO2: 98% 97% 98% 96%  Weight:    82.2 kg  Height:        Wt Readings from Last 3 Encounters:  08/19/22 82.2 kg  07/19/22 84.3 kg  07/16/22 85.7 kg     Intake/Output Summary (Last 24 hours) at 08/19/2022 0746 Last data filed at 08/19/2022 0100 Gross per 24 hour  Intake 1140 ml  Output 1200 ml  Net -60 ml     Physical Exam  Awake Alert, No new F.N deficits, Normal affect, right arm PICC line, right IJ dialysis catheter,  Luis M. Cintron.AT,PERRAL Supple Neck, No JVD,   Symmetrical Chest wall movement, Good air movement bilaterally, CTAB RRR,No Gallops,Rubs or new Murmurs,  +ve B.Sounds, Abd Soft, No tenderness,   No Cyanosis, Clubbing or edema       Data Review:    Recent Labs  Lab 08/15/22 1315 08/15/22 2244 08/16/22 0423 08/17/22 0155 08/18/22 0436 08/19/22 0332  WBC 0.7*  --  1.0* 2.2* 5.4 14.3*  HGB 6.3* 7.6* 7.6* 7.2* 7.5* 7.0*  HCT 20.3* 24.1* 23.6* 22.7* 22.1* 21.4*  PLT 42*  --  53* 78* 104* 131*  MCV 87.9  --  87.1 85.0 85.7 86.6  MCH 27.3  --  28.0 27.0 29.1 28.3  MCHC 31.0  --  32.2 31.7 33.9 32.7  RDW 15.9*  --  15.7* 15.6* 15.9* 16.2*  LYMPHSABS 0.4*  --  0.4* 0.4* 0.4* 0.6*  MONOABS 0.1  --  0.2 0.5 0.4 0.3  EOSABS 0.0  --  0.0 0.0 0.0 0.0  BASOSABS 0.0  --  0.0 0.0 0.0 0.0    Recent Labs  Lab 08/14/22 0425 08/14/22 1714 08/15/22 0504 08/15/22 1528 08/15/22 2244 08/16/22 0423 08/17/22 0805 08/18/22 0436 08/19/22 0332  NA 136 135 135 135 136 133* 133* 133* 132*  K 4.0 4.3 4.3 4.4 4.8 5.0 5.8* 5.1 5.4*  CL 104 105 105 106 103 101 101 97* 95*  CO2 26 25 25 26 25 25 23 27 25   ANIONGAP 6 5 5  3* 8 7 9 9 12   GLUCOSE 144* 155* 163* 144* 138* 117* 108* 113* 88  BUN 31* 34* 34* 37* 52* 60* 88* 66* 81*  CREATININE 1.47* 1.60* 1.55* 1.65* 2.26* 2.59*  4.17* 3.68* 4.90*  ALBUMIN 1.9* 2.0* 1.8* 1.8*  --  1.8* 1.7*  --   --   BNP  --   --   --   --   --   --   --  225.4* 316.2*  MG 2.5*  --  2.3  --   --  2.4  --  1.9 2.1  CALCIUM 7.5* 7.4* 7.2* 7.2* 7.4* 7.4* 7.6* 7.7* 8.1*   Lab Results  Component Value Date   TSH 3.619 08/01/2022    Recent Labs  Lab 08/14/22 0425 08/14/22 1714 08/15/22 0504 08/15/22 1528 08/15/22 2244 08/16/22 0423 08/17/22 0805 08/18/22 0436 08/19/22 0332  BNP  --   --   --   --   --   --   --  225.4* 316.2*  MG 2.5*  --  2.3  --   --  2.4  --  1.9 2.1  CALCIUM 7.5*   < > 7.2*   < > 7.4* 7.4* 7.6* 7.7* 8.1*   < > = values in this interval not displayed.      Micro Results No results found for this or any previous visit (from the past 240 hour(s)).   Radiology Reports DG Abd 1 View  Result Date: 08/16/2022 CLINICAL DATA:  Ileus. EXAM: ABDOMEN - 1 VIEW COMPARISON:  08/08/2022. FINDINGS: The bowel gas pattern is normal. An enteric tube terminates in the stomach. No radio-opaque calculi or other significant radiographic abnormality are seen. IMPRESSION: Nonobstructive bowel-gas pattern. Electronically Signed   By: Thornell Sartorius M.D.   On: 08/16/2022 04:56      Signature  -   Susa Raring M.D on 08/19/2022 at 7:46 AM   -  To page go to [email or web address]

## 2022-08-19 NOTE — Progress Notes (Signed)
Hematology Short Note  Chart and labs reviewed. Neutropenia resolved Thrombocytopenia resolving rapidly post chemotherapy Still with anemia -- multifactorial -- lymphoma + AKI + CTx + low grade MDS.  -off granix -transfuse prn for hgb<7.5 -will need to consider starting ESA if still needing transfusion support by end of this week. -hematology will continue to follow. -appreciate hospital medicine and nephrology cares.  Wyvonnia Lora MD MS

## 2022-08-19 NOTE — Progress Notes (Signed)
Received patient in bed .Awake,alert and oriented x 4.Consent verified.  Access used :Right HD catheter that worked well.Dressing on date.  Duration of treatment 3 hours.  Fluid removed : 2300 cc. 300 ccc offset for 1 unit of blood volume given.  Hemo comment: One unit of blood given. Treatment well tolerated.  Hand off to the patient's nurse.

## 2022-08-19 NOTE — Progress Notes (Signed)
[facility] Progress Note  Subjective: seen in HD unit.   Vitals:   08/19/22 0920 08/19/22 0938 08/19/22 1000 08/19/22 1030  BP: 120/82 105/81 125/85 114/80  Pulse: 88 86 87 87  Resp: 20 18 20  (!) 21  Temp: 97.6 F (36.4 C)     TempSrc:      SpO2: 97% 98% 97% 99%  Weight:      Height:        Exam:   alert, nad   no jvd  Chest cta bilat  Cor reg no RG  Abd soft ntnd no ascites   Ext no LE edema   Alert, NF, ox3   RIJ temp HD cath      Date   Creat  eGFR  notes    July 2-17, 2021 0.87- 1.17 > 60 ml/min Isolated creat of 1.52 on 7/07 Dec 2021  0.69- 1.16 > 60 ml/min     6/03- 08, 2024 0.70- 1.01 > 81ml/min This admit    6/10- 24, 2024 1.36- 4.90   This admit         UA 6/3 - negative     UA 6/6 - negative     UA 6/11 - small bili, mod Hb, ket 5, prot 100, few bact, 6-10 rbc, 21-50 wbc, 0-5 epi      Assessment/ Plan: AKI - b/l creatinine 0.6- 0.9 here on admission. Creat increased up to 1.3 on 6/10 and 2.3 on 6/11.  UA showed ^wbc, min rbc's/ protein. Renal US showed no obstruction. Pt did had IV contrast exposure and some hypotension, but primarily AKI was more likely due to tumor lysis syndrome. Uric acid/ hyperkalemia peaked then resolved w/ CRRT and rasburicase. SP CRRT 6/13- 6/20, is now transitioned to [facility] which he had on 6/22 and is having today 6/24. UOP has recovered over the last several days, on average 800 cc/ day.  Got IV lasix 80mg  x 1 yesterday. Looks a bit dry on exam, creat up to 4.9 today pre HD. Will start him on IVF"s at 75 cc/hr. Will follow.  Tumor lysis syndrome - sp rasburicase 6mg  on 6/12 and another 6mg  on 6/13.  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 Ritux given 6/19.  Pancytopenia - leukopenia and plts resolved, Hb remains low in 7- 8 range.  Parox atrial fib- on amiodarone      Vinson Moselle MD  [facility] 08/19/2022, 10:38 AM  Recent Labs  Lab 08/16/22 0423 08/17/22 0155  08/17/22 0805 08/18/22 0436 08/19/22 0332  HGB 7.6*   < >  --  7.5* 7.0*  ALBUMIN 1.8*  --  1.7*  --   --   CALCIUM 7.4*  --  7.6* 7.7* 8.1*  PHOS 3.0  --  3.9  --   --   CREATININE 2.59*  --  4.17* 3.68* 4.90*  K 5.0  --  5.8* 5.1 5.4*   < > = values in this interval not displayed.   No results for input(s): "IRON", "TIBC", "FERRITIN" in the last 168 hours. Inpatient medications:  acetaminophen  650 mg Per Tube Once   allopurinol  100 mg Per Tube BID   amiodarone  200 mg Per Tube Daily   B-complex with vitamin C  1 tablet Per Tube Daily   Chlorhexidine Gluconate Cloth  6 each Topical Q0600   insulin aspart  0-6 Units Subcutaneous Q4H   mouth rinse  15  mL Mouth Rinse 4 times per day   pantoprazole (PROTONIX) IV  40 mg Intravenous Daily    sodium chloride Stopped (08/13/22 0021)   feeding supplement (VITAL 1.5 CAL) 60 mL/hr at 08/18/22 0157   docusate, ipratropium-albuterol, lip balm, melatonin, ondansetron **OR** ondansetron (ZOFRAN) IV, oxyCODONE, phenol, polyethylene glycol, polyvinyl alcohol, senna, sodium chloride flush, traZODone

## 2022-08-20 DIAGNOSIS — R5381 Other malaise: Secondary | ICD-10-CM | POA: Diagnosis not present

## 2022-08-20 DIAGNOSIS — R6520 Severe sepsis without septic shock: Secondary | ICD-10-CM | POA: Diagnosis not present

## 2022-08-20 DIAGNOSIS — I10 Essential (primary) hypertension: Secondary | ICD-10-CM | POA: Diagnosis not present

## 2022-08-20 DIAGNOSIS — E871 Hypo-osmolality and hyponatremia: Secondary | ICD-10-CM | POA: Diagnosis not present

## 2022-08-20 DIAGNOSIS — K921 Melena: Secondary | ICD-10-CM | POA: Diagnosis not present

## 2022-08-20 DIAGNOSIS — C8338 Diffuse large B-cell lymphoma, lymph nodes of multiple sites: Secondary | ICD-10-CM | POA: Diagnosis not present

## 2022-08-20 DIAGNOSIS — D649 Anemia, unspecified: Secondary | ICD-10-CM | POA: Diagnosis not present

## 2022-08-20 DIAGNOSIS — R1310 Dysphagia, unspecified: Secondary | ICD-10-CM

## 2022-08-20 DIAGNOSIS — A419 Sepsis, unspecified organism: Secondary | ICD-10-CM | POA: Diagnosis not present

## 2022-08-20 DIAGNOSIS — R195 Other fecal abnormalities: Secondary | ICD-10-CM | POA: Diagnosis present

## 2022-08-20 DIAGNOSIS — N170 Acute kidney failure with tubular necrosis: Secondary | ICD-10-CM | POA: Diagnosis not present

## 2022-08-20 LAB — BPAM RBC
Blood Product Expiration Date: 202407212359
ISSUE DATE / TIME: 202406251549
Unit Type and Rh: 5100
Unit Type and Rh: 5100

## 2022-08-20 LAB — BASIC METABOLIC PANEL
Anion gap: 11 (ref 5–15)
BUN: 45 mg/dL — ABNORMAL HIGH (ref 8–23)
CO2: 24 mmol/L (ref 22–32)
Calcium: 7.2 mg/dL — ABNORMAL LOW (ref 8.9–10.3)
Chloride: 102 mmol/L (ref 98–111)
Creatinine, Ser: 3.38 mg/dL — ABNORMAL HIGH (ref 0.61–1.24)
GFR, Estimated: 19 mL/min — ABNORMAL LOW (ref >60)
Glucose, Bld: 78 mg/dL (ref 70–99)
Potassium: 4 mmol/L (ref 3.5–5.1)
Sodium: 137 mmol/L (ref 135–145)

## 2022-08-20 LAB — HEPATIC FUNCTION PANEL
ALT: 18 U/L (ref 0–44)
AST: 25 U/L (ref 15–41)
Albumin: 1.7 g/dL — ABNORMAL LOW (ref 3.5–5.0)
Alkaline Phosphatase: 111 U/L (ref 38–126)
Bilirubin, Direct: 0.2 mg/dL (ref 0.0–0.2)
Indirect Bilirubin: 0.6 mg/dL (ref 0.3–0.9)
Total Bilirubin: 0.8 mg/dL (ref 0.3–1.2)
Total Protein: 3.9 g/dL — ABNORMAL LOW (ref 6.5–8.1)

## 2022-08-20 LAB — CBC
HCT: 19.9 % — ABNORMAL LOW (ref 39.0–52.0)
Hemoglobin: 6.4 g/dL — CL (ref 13.0–17.0)
MCH: 28.8 pg (ref 26.0–34.0)
MCHC: 32.2 g/dL (ref 30.0–36.0)
MCV: 89.6 fL (ref 80.0–100.0)
Platelets: 171 10*3/uL (ref 150–400)
RBC: 2.22 MIL/uL — ABNORMAL LOW (ref 4.22–5.81)
RDW: 17.1 % — ABNORMAL HIGH (ref 11.5–15.5)
WBC: 13.6 10*3/uL — ABNORMAL HIGH (ref 4.0–10.5)
nRBC: 0 % (ref 0.0–0.2)

## 2022-08-20 LAB — TYPE AND SCREEN
Antibody Screen: NEGATIVE
Unit division: 0

## 2022-08-20 LAB — CBC WITH DIFFERENTIAL/PLATELET
Abs Immature Granulocytes: 0.1 10*3/uL — ABNORMAL HIGH (ref 0.00–0.07)
Basophils Absolute: 0.2 10*3/uL — ABNORMAL HIGH (ref 0.0–0.1)
Basophils Relative: 2 %
Eosinophils Absolute: 0 10*3/uL (ref 0.0–0.5)
Eosinophils Relative: 0 %
HCT: 23.5 % — ABNORMAL LOW (ref 39.0–52.0)
Hemoglobin: 7.6 g/dL — ABNORMAL LOW (ref 13.0–17.0)
Lymphocytes Relative: 2 %
Lymphs Abs: 0.2 10*3/uL — ABNORMAL LOW (ref 0.7–4.0)
MCH: 28 pg (ref 26.0–34.0)
MCHC: 32.3 g/dL (ref 30.0–36.0)
MCV: 86.7 fL (ref 80.0–100.0)
Monocytes Absolute: 1.4 10*3/uL — ABNORMAL HIGH (ref 0.1–1.0)
Monocytes Relative: 11 %
Myelocytes: 1 %
Neutro Abs: 10.3 10*3/uL — ABNORMAL HIGH (ref 1.7–7.7)
Neutrophils Relative %: 84 %
Platelets: 154 10*3/uL (ref 150–400)
RBC: 2.71 MIL/uL — ABNORMAL LOW (ref 4.22–5.81)
RDW: 16.8 % — ABNORMAL HIGH (ref 11.5–15.5)
WBC: 12.3 10*3/uL — ABNORMAL HIGH (ref 4.0–10.5)
nRBC: 0 % (ref 0.0–0.2)
nRBC: 0 /100 WBC

## 2022-08-20 LAB — GLUCOSE, CAPILLARY
Glucose-Capillary: 101 mg/dL — ABNORMAL HIGH (ref 70–99)
Glucose-Capillary: 68 mg/dL — ABNORMAL LOW (ref 70–99)
Glucose-Capillary: 68 mg/dL — ABNORMAL LOW (ref 70–99)
Glucose-Capillary: 76 mg/dL (ref 70–99)
Glucose-Capillary: 85 mg/dL (ref 70–99)
Glucose-Capillary: 88 mg/dL (ref 70–99)
Glucose-Capillary: 95 mg/dL (ref 70–99)
Glucose-Capillary: 99 mg/dL (ref 70–99)

## 2022-08-20 LAB — PREPARE RBC (CROSSMATCH)

## 2022-08-20 LAB — BRAIN NATRIURETIC PEPTIDE: B Natriuretic Peptide: 309.2 pg/mL — ABNORMAL HIGH (ref 0.0–100.0)

## 2022-08-20 LAB — MAGNESIUM: Magnesium: 1.6 mg/dL — ABNORMAL LOW (ref 1.7–2.4)

## 2022-08-20 MED ORDER — RENA-VITE PO TABS
1.0000 | ORAL_TABLET | Freq: Every day | ORAL | Status: DC
  Administered 2022-08-20 – 2022-08-27 (×8): 1 via ORAL
  Filled 2022-08-20 (×8): qty 1

## 2022-08-20 MED ORDER — PANTOPRAZOLE SODIUM 40 MG IV SOLR
40.0000 mg | Freq: Two times a day (BID) | INTRAVENOUS | Status: DC
  Administered 2022-08-20 – 2022-08-21 (×2): 40 mg via INTRAVENOUS
  Filled 2022-08-20 (×2): qty 10

## 2022-08-20 MED ORDER — SODIUM CHLORIDE 0.9% IV SOLUTION: Freq: Once | INTRAVENOUS | Status: AC

## 2022-08-20 NOTE — Progress Notes (Addendum)
PROGRESS NOTE                                                                                                                                                                                                             Patient Demographics:    Tyler [name], is a 72 y.o. male, DOB - [birth year], ZOX:[id number]  Outpatient Primary MD for the patient is Nechama Guard, FNP    LOS - 21  Admit date - 07/29/2022    Chief Complaint  Patient presents with   Near Syncope   Loss of Consciousness       Brief Narrative (HPI from H&P)   72 yo male with history of PE, hypertension, paroxysmal atrial fibrillation, recurrent UTIs, low grade myelodysplastic syndrome found to have generalized lymphadenopathy from high grade large B cell lymphoma. Plan was to start on R-CHOP. Came to ER with near syncope. He had fever and hypotension with elevated lactic acid level. Started on ABx.  He had seizure like activity, and Rt facial paresthesia. He developed renal failure. Seen by cardiology for PAF. Had PICC placed and started on R mini CHOP on 08/05/22. Renal fx got worse and nephrology consulted. Had elevated uric acid with concern for tumor lysis syndrome and received rasburicase. Rituxan held because of this. Renal fx and mental status got worse. Concern for aspiration pneumonitis.  He was seen by the ICU team and kept in ICU, also seen by nephrology, oncology along with cardiology.  He was eventually started on CRRT and subsequently switched to HD treatments, he was transferred to my care on 08/17/2022 on day 19 of his hospital stay when he was switched from [facility] to [facility].    Significant Hospital Events: I   6/03 Presented to ED for fever, near-syncope. Presumed sepsis. Admitted to [facility]. 6/13 Worsening acute renal failure as evidenced by rising BUN/Cr, hyperkalemia to 5.6. PCCM consulted for ARF/need for RRT, line placement.  Intubated. R IJ Trialysis catheter placed. CRRT started. 6/15 transfuse 1 unit PRBC 6/16 extubate 6/17 no acute events overnight, remains on CRRT 6/18 no acute events overnight, remains on CRRT 6/19 Rituximab, CRRT 08/17/2022 started on HD 08/17/2022 transferred to hospitalist service under my care   Subjective:   Patient in bed, appears comfortable, denies any headache, no fever, no chest pain or pressure, no shortness  of breath , no abdominal pain. No new focal weakness.   Assessment  & Plan :   Aspiration pneumonia, sepsis, acute hypoxic respiratory failure with compromised airway.  Initially intubated kept in ICU, extubated on 08/11/2022, has finished antibiotics on 08/13/2022.  Currently on 2 L nasal cannula, NG tube DC'd on 08/18/22, speech following.  Continue to monitor closely.  Sepsis with hypotension.  Required antibiotics and pressor support in ICU, now resolved.  Off of antibiotics and pressors.  Monitor.    Acute renal failure: Suspect multifactorial related to possible sepsis as well as contribution of tumor lysis syndrome given elevated uric acid etc. s/p rasburicase x 2, seen by nephrology, has right IJ HD catheter placed, initially underwent CRRT treatment now started on HD treatment on 08/17/2022.  Nephrology on board, still hoping for renal recovery Urine output is still reasonable.  Paroxysmal A-fib: No AC due to history of bleeding - seen by cardiology, currently on amiodarone for rate control, not on anticoagulation due to high bleeding risk in the setting of pancytopenia.  Continue to monitor on telemetry.  Stable recent TSH.   High-grade Follicular B cell lymphoma.  Being followed by hematology, s/p mini CHOP treatment on 08/06/2022, complicated by severe pancytopenia, tumor lysis syndrome with AKI. Also received Rituxan this admission.  Defer further management to oncology service.  Pancytopenia:  -- Transfused 1 unit PRBC on 08/17/22 & another on 08/19/22 hemoglobin goal  greater than 7.5, platelets counts gradually rising, neutropenia and leukopenia improved with Granix (stopped 08/18/22).  Defer further management to hematology and oncology team.   Tumor lysis syndrome: Continue allopurinol and now also getting HD for renal failure.   Metabolic encephalopathy.  Improving.  Continue to monitor.  Generalized weakness and dysphagia.  Was on NG tube feeds, speech evaluation  >> D3 diet, discontinued core track tube 08/18/22 as patient complaining of discomfort.  Past history of hep B.  Now on immunosuppressive including Rituxan for lymphoma, ID consulted for any needed suppressive treatment.  Melanotic stool late in the afternoon of 08/20/2022 with acute blood loss related anemia on top of anemia due to marrow dysfunction.  Already on IV PPI, dose doubled, GI consulted, 2 units of packed RBC on 08/20/2022 at 3 PM.       Condition - Extremely Guarded  Family Communication  : daughter Karena Addison - [phone] - 08/18/22  Code Status : Full code  Consults  : PCCM, oncology, nephrology, cardiology  PUD Prophylaxis : PPI   Procedures  :            Disposition Plan  :    Status is: Inpatient   DVT Prophylaxis  :    Place and maintain sequential compression device Start: 08/07/22 0936 SCDs Start: 07/30/22 0108    Lab Results  Component Value Date   PLT 131 (L) 08/19/2022    Diet :  Diet Order             DIET DYS 3 Room service appropriate? Yes with Assist; Fluid consistency: Thin  Diet effective now                    Inpatient Medications  Scheduled Meds:  sodium chloride   Intravenous Once   acetaminophen  650 mg Per Tube Once   allopurinol  100 mg Per Tube BID   amiodarone  200 mg Per Tube Daily   B-complex with vitamin C  1 tablet Per Tube Daily   Chlorhexidine Gluconate Cloth  6 each Topical Q0600   insulin aspart  0-6 Units Subcutaneous Q4H   mouth rinse  15 mL Mouth Rinse 4 times per day   pantoprazole (PROTONIX) IV  40 mg  Intravenous Daily   Continuous Infusions:  sodium chloride Stopped (08/13/22 0021)   feeding supplement (VITAL 1.5 CAL) 60 mL/hr at 08/18/22 0157   PRN Meds:.docusate, ipratropium-albuterol, lip balm, melatonin, ondansetron **OR** ondansetron (ZOFRAN) IV, oxyCODONE, phenol, polyethylene glycol, polyvinyl alcohol, senna, sodium chloride flush, traZODone    Objective:   Vitals:   08/18/22 2327 08/19/22 0354 08/19/22 0400 08/19/22 0741  BP: 124/79 137/79 130/83 125/82  Pulse: 96 93 90 89  Resp: 20 (!) 23 17 (!) 22  Temp: 97.9 F (36.6 C) 98.2 F (36.8 C) 98 F (36.7 C) 98.1 F (36.7 C)  TempSrc: Oral Oral Oral   SpO2: 98% 97% 98% 96%  Weight:    82.2 kg  Height:        Wt Readings from Last 3 Encounters:  08/19/22 82.2 kg  07/19/22 84.3 kg  07/16/22 85.7 kg     Intake/Output Summary (Last 24 hours) at 08/19/2022 0746 Last data filed at 08/19/2022 0100 Gross per 24 hour  Intake 1140 ml  Output 1200 ml  Net -60 ml     Physical Exam  Awake Alert, No new F.N deficits, Normal affect, right arm PICC line, right IJ dialysis catheter,  [place].AT,PERRAL Supple Neck, No JVD,   Symmetrical Chest wall movement, Good air movement bilaterally, CTAB RRR,No Gallops,Rubs or new Murmurs,  +ve B.Sounds, Abd Soft, No tenderness,   No Cyanosis, Clubbing or edema       Data Review:    Recent Labs  Lab 08/15/22 1315 08/15/22 2244 08/16/22 0423 08/17/22 0155 08/18/22 0436 08/19/22 0332  WBC 0.7*  --  1.0* 2.2* 5.4 14.3*  HGB 6.3* 7.6* 7.6* 7.2* 7.5* 7.0*  HCT 20.3* 24.1* 23.6* 22.7* 22.1* 21.4*  PLT 42*  --  53* 78* 104* 131*  MCV 87.9  --  87.1 85.0 85.7 86.6  MCH 27.3  --  28.0 27.0 29.1 28.3  MCHC 31.0  --  32.2 31.7 33.9 32.7  RDW 15.9*  --  15.7* 15.6* 15.9* 16.2*  LYMPHSABS 0.4*  --  0.4* 0.4* 0.4* 0.6*  MONOABS 0.1  --  0.2 0.5 0.4 0.3  EOSABS 0.0  --  0.0 0.0 0.0 0.0  BASOSABS 0.0  --  0.0 0.0 0.0 0.0    Recent Labs  Lab 08/14/22 0425 08/14/22 1714  08/15/22 0504 08/15/22 1528 08/15/22 2244 08/16/22 0423 08/17/22 0805 08/18/22 0436 08/19/22 0332  NA 136 135 135 135 136 133* 133* 133* 132*  K 4.0 4.3 4.3 4.4 4.8 5.0 5.8* 5.1 5.4*  CL 104 105 105 106 103 101 101 97* 95*  CO2 26 25 25 26 25 25 23 27 25   ANIONGAP 6 5 5  3* 8 7 9 9 12   GLUCOSE 144* 155* 163* 144* 138* 117* 108* 113* 88  BUN 31* 34* 34* 37* 52* 60* 88* 66* 81*  CREATININE 1.47* 1.60* 1.55* 1.65* 2.26* 2.59* 4.17* 3.68* 4.90*  ALBUMIN 1.9* 2.0* 1.8* 1.8*  --  1.8* 1.7*  --   --   BNP  --   --   --   --   --   --   --  225.4* 316.2*  MG 2.5*  --  2.3  --   --  2.4  --  1.9 2.1  CALCIUM 7.5* 7.4* 7.2*  7.2* 7.4* 7.4* 7.6* 7.7* 8.1*   Lab Results  Component Value Date   TSH 3.619 08/01/2022    Recent Labs  Lab 08/14/22 0425 08/14/22 1714 08/15/22 0504 08/15/22 1528 08/15/22 2244 08/16/22 0423 08/17/22 0805 08/18/22 0436 08/19/22 0332  BNP  --   --   --   --   --   --   --  225.4* 316.2*  MG 2.5*  --  2.3  --   --  2.4  --  1.9 2.1  CALCIUM 7.5*   < > 7.2*   < > 7.4* 7.4* 7.6* 7.7* 8.1*   < > = values in this interval not displayed.      Micro Results No results found for this or any previous visit (from the past 240 hour(s)).   Radiology Reports DG Abd 1 View  Result Date: 08/16/2022 CLINICAL DATA:  Ileus. EXAM: ABDOMEN - 1 VIEW COMPARISON:  08/08/2022. FINDINGS: The bowel gas pattern is normal. An enteric tube terminates in the stomach. No radio-opaque calculi or other significant radiographic abnormality are seen. IMPRESSION: Nonobstructive bowel-gas pattern. Electronically Signed   By: Thornell Sartorius M.D.   On: 08/16/2022 04:56      Signature  -   Susa Raring M.D on 08/19/2022 at 7:46 AM   -  To page go to [email or web address]

## 2022-08-20 NOTE — Progress Notes (Signed)
Physical Therapy Treatment Patient Details Name: Tyler [name] MRN: [id number] DOB: November 03, 1950 Today's Date: 08/20/2022   History of Present Illness Pt is a 72 yo male who presented to [facility] with poor po intake, abdominal pain, near syncope, fever, hypotension, and elevated lactic acid level. 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 While at [facility], pt had seizure like activity, Rt facial paresthesia, and developed renal failure. Seen by cardiology for PAF. Pt had PICC placed and started on R mini CHOP on 08/05/22. Renal fx got worse with elevated uric acid with concern for tumor lysis syndrome and received rasburicase. Renal fx and mental status got worse. Concern for aspiration pneumonitis. Pt kept in ICU and eventually started on CRRT and subsequently switched to HD treatments and transferred to [facility] on 6/22/202. Pt with PMH significant of PE, hypertension, paroxysmal atrial fibrillation, and recurrent UTIs.    PT Comments    Pt with improved ability to stand and ambulate this date despite report of dizziness and LE pain and weakness. Pt assisted to [facility] in which pt had black, tarry stool, RN Notified. Pt was able to amb 10' with RW and modA without either knee buckling. D/c recommendations remain appropriate. Acute PT to cont to follow.    Recommendations for follow up therapy are one component of a multi-disciplinary discharge planning process, led by the attending physician.  Recommendations may be updated based on patient status, additional functional criteria and insurance authorization.  Follow Up Recommendations  Can patient physically be transported by private vehicle: No    Assistance Recommended at Discharge Intermittent Supervision/Assistance  Patient can return home with the following Two people to help with walking and/or transfers;A little help with bathing/dressing/bathroom;Assistance with  cooking/housework;Assist for transportation;Help with stairs or ramp for entrance   Equipment Recommendations  Other (comment) (TBA)    Recommendations for Other Services Rehab consult     Precautions / Restrictions Precautions Precautions: Fall Precaution Comments: L4 compression fracture, starting chemo, watch BP Restrictions Weight Bearing Restrictions: No     Mobility  Bed Mobility Overal bed mobility: Needs Assistance Bed Mobility: Supine to Sit Rolling: Mod assist   Supine to sit: HOB elevated, Min guard     General bed mobility comments: pt began bringing LEs over, reached out for PT's hand to help with trunk elevation, increased time    Transfers Overall transfer level: Needs assistance Equipment used: Rolling walker (2 wheels) Transfers: Sit to/from Stand, Bed to chair/wheelchair/BSC Sit to Stand: Mod assist, +2 safety/equipment   Step pivot transfers: Mod assist, +2 safety/equipment, +2 physical assistance       General transfer comment: pt with good initial power up but able required modAx2 to complete upright standing from EOB, pt with improved ability when pushing up from arm rests. verbal cues to push up from surface not pull up from RW, modA for walker management for transfer to [facility]    Ambulation/Gait Ambulation/Gait assistance: Mod assist, +2 safety/equipment Gait Distance (Feet): 10 Feet Assistive device: Rolling walker (2 wheels) Gait Pattern/deviations: Decreased step length - right, Decreased step length - left, Decreased stride length, Knees buckling, Trunk flexed Gait velocity: reduced   Pre-gait activities: worked on Agricultural engineer in place in [employer] x 10 reps, x 2 sets General Gait Details: attempted x3, pt reports "no strength" and rubs his bilat thighs. pt was able to amb with modA from PT and tech pushing chair to door without knee  buckling however pt very fatigued   Stairs             Wheelchair Mobility    Modified Rankin (Stroke  Patients Only)       Balance Overall balance assessment: Needs assistance Sitting-balance support: Single extremity supported, Bilateral upper extremity supported, Feet supported, No upper extremity supported Sitting balance-Leahy Scale: Fair Sitting balance - Comments: initially needed support, but improved, but close MIN/guard Postural control: Posterior lean (when moving Bilateral LE) Standing balance support: Bilateral upper extremity supported, During functional activity, Reliant on assistive device for balance Standing balance-Leahy Scale: Poor Standing balance comment: reliant on RW and modA                            Cognition Arousal/Alertness: Awake/alert Behavior During Therapy: WFL for tasks assessed/performed Overall Cognitive Status: Difficult to assess Area of Impairment: Following commands                   Current Attention Level: Sustained   Following Commands: Follows one step commands inconsistently, Follows one step commands with increased time   Awareness: Intellectual Problem Solving: Slow processing General Comments: Pt requiring increased time for processing. Pt demonstrates ability to follow 1 step commands consistantly with increased time and visual cues. pt prefers swahili but doesn't like the interpreter, suspect language barrier is causing some difficulty with command follow        Exercises General Exercises - Lower Extremity Long Arc Quad: AROM, Strengthening, Both, Seated, 10 reps Hip Flexion/Marching: AROM, Strengthening, Both, 10 reps, Seated (with RW and modA for balance)    General Comments General comments (skin integrity, edema, etc.): pt reports dizziness when upright in standing, VSS 111/76. Pt with +BM on BSC, RN notified as it was dark, tarry, and had blood in it      Pertinent Vitals/Pain Pain Assessment Pain Assessment: Faces Faces Pain Scale: Hurts little more Pain Location: L LE with movement Pain  Descriptors / Indicators: Grimacing Pain Intervention(s): Limited activity within patient's tolerance    Home Living                          Prior Function            PT Goals (current goals can now be found in the care plan section) Acute Rehab PT Goals Patient Stated Goal: to get stronger PT Goal Formulation: With patient/family Time For Goal Achievement: 09/01/22 Potential to Achieve Goals: Good Progress towards PT goals: Progressing toward goals    Frequency    Min 3X/week      PT Plan Discharge plan needs to be updated;Current plan remains appropriate    Co-evaluation              AM-PAC PT "6 Clicks" Mobility   Outcome Measure  Help needed turning from your back to your side while in a flat bed without using bedrails?: A Little Help needed moving from lying on your back to sitting on the side of a flat bed without using bedrails?: A Little Help needed moving to and from a bed to a chair (including a wheelchair)?: A Lot Help needed standing up from a chair using your arms (e.g., wheelchair or bedside chair)?: A Lot Help needed to walk in hospital room?: Total Help needed climbing 3-5 steps with a railing? : Total 6 Click Score: 12    End of Session Equipment Utilized During  Treatment: Gait belt Activity Tolerance: Patient tolerated treatment well Patient left: in chair;with call bell/phone within reach;with chair alarm set Nurse Communication: Mobility status (tarry BM) PT Visit Diagnosis: Other abnormalities of gait and mobility (R26.89);Difficulty in walking, not elsewhere classified (R26.2);Unsteadiness on feet (R26.81);Muscle weakness (generalized) (M62.81)     Time: 4098-1191 PT Time Calculation (min) (ACUTE ONLY): 30 min  Charges:  $Gait Training: 8-22 mins $Therapeutic Activity: 8-22 mins                     Lewis [name], PT, DPT Acute Rehabilitation Services Secure chat preferred Office #: [phone]    Iona Hansen 08/20/2022, 12:33 PM

## 2022-08-20 NOTE — H&P (View-Only) (Signed)
Consultation  Referring Provider:   Dr. Thedore Mins Primary Care Physician:  Nechama Guard, FNP Primary Gastroenterologist:  Gentry Fitz       Reason for Consultation: Acute on chronic anemia and melena         HPI:   Tyler [name] is a 72 y.o. 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   10/2021 found to have pancytopenia and diagnosed with dysplastic syndrome, being transfused until 04/2022 when condition transformed into follicular B-cell lymphoma diffusely. Oncology was preparing for chemotherapy with  Port-A-Cath until patient admitted with sepsis on 06/03. He had hypotension, lactic acid 5 thrombocytopenia and anemia.  Hospital course complicated by seizure-like activity with right facial paresthesias. Some concern for tumor lysis syndrome, oncology following.  Patient had altered mental status, had possible aspiration pneumonia initially intubated, extubated 6/16 finished antibiotics 6/18. ARF  with PICC placed and R mini CHOP started 08/05/2022,  CRRT started 6/13  06/22 transferred [facility] [place] [facility]. 6/23 discontinued core track, patient on dysphagia 3 diet, speech following. 6/25 at 3 AM HGB 7.6, down to 6.4 after 1 episode of dark and tarry stools.   08/10/2022 positive FOBT, 11/2021 negative FOBT  Patient denies history of colonoscopy or endoscopy.  Denies family history of GI malignancy. Patient states last 2 to 3 days has been having worsening reflux and acid. Initial presentation had decreased p.o. intake and nausea however this may have been related to ARF and uremia, this is resolved with dialysis. Patient denies ever seeing melenic stools or  hematochezia until this morning had very dark tarry bowel movement. Patient denies abdominal pain but states he has been having more abdominal bloating especially after eating.  The patient states in May he was having abdominal discomfort with nausea and vomiting, having decreased bowel movements at that time but he states he is having good Bm's while in the hospital.  Patient states he was having some dysphagia when he had core track in place, this was discontinued 6/23. Patient denies NSAIDs, prednisone, smoking, drug history.  Patient states 10+ years ago he would drink wine occasionally but was never a heavy drinker.  WBC 13.6, compared to 6/22 WBC 2.2, platelets 171 compared to 131. BUN 45, creatinine 3.38, previous BUN 81 and 4.9- no acute elevation BNP 309 07/29/2022 CT and pelvis without contrast for abdominal pain showed no acute abdominal/pelvic process, diverticulosis without diverticulitis, numerous lymph nodes multiple sizes present throughout abdomen pelvis predominantly mesentery and periaortic region.  Normal liver, normal gallstone, mild generalized pancreatic atrophy no ductal dilation normal spleen. 07/15/2022 CT abdomen pelvis with contrast for right upper and lower abdominal discomfort 6 weeks with nausea and vomiting.  No bowel obstruction, no significant change in lymphadenopathy, 2.9 cm low-density lesion medial aspect of the spleen, lymphoma.  Showed moderate stool throughout colon normal TI.  Normal liver gallbladder.   Past Medical History:  Diagnosis Date   Cancer [facility])    prostate   Follicular lymphoma (HCC) 2024   History of pulmonary embolism    Hypertension    Myelodysplastic syndrome (HCC)    PAF (paroxysmal atrial fibrillation) (HCC)    Recurrent UTI     Surgical History:  He  has a past surgical history that includes Robot assisted  laparoscopic radical prostatectomy (N/A, 09/01/2019) and Lymphadenectomy (Bilateral, 09/01/2019). Family History:  His family  history includes Stroke in his father. Social History:   reports that he has never smoked. He has never used smokeless tobacco. He reports current alcohol use. He reports that he does not use drugs.  Prior to Admission medications   Medication Sig Start Date End Date Taking? Authorizing Provider  B Complex Vitamins (VITAMIN B COMPLEX) TABS Take 1 tablet by mouth daily. 12/21/21  Yes Amin, Loura Halt, MD  cholecalciferol (VITAMIN D3) 25 MCG (1000 UNIT) tablet Take 2 tablets (2,000 Units total) by mouth daily. 01/21/22  Yes Johney Maine, MD  diclofenac Sodium (VOLTAREN) 1 % GEL APPLY 4 G TOPICALLY 4 (FOUR) TIMES DAILY. APPLY TO RIGHT LATERAL HIP AND THIGH DOWN TO KNEE Patient taking differently: Apply 4 g topically daily as needed (For pain). 06/12/22  Yes Nestor Ramp, MD  folic acid (FOLVITE) 1 MG tablet Take 1 mg by mouth daily. 03/27/22  Yes [provider]  lidocaine-prilocaine (EMLA) cream Apply to affected area once Patient taking differently: Apply 1 Application topically daily as needed (For port). 07/26/22  Yes Johney Maine, MD  lisinopril (ZESTRIL) 5 MG tablet Take 5 mg by mouth daily.   Yes [provider]  Menthol-Methyl Salicylate (MUSCLE RUB) 10-15 % CREA Apply 1 Application topically daily as needed for muscle pain.   Yes [provider]  metoCLOPramide (REGLAN) 10 MG tablet Take 1 tablet (10 mg total) by mouth every 6 (six) hours as needed for nausea. 07/15/22  Yes Gerhard Munch, MD  Multiple Vitamin (MULTIVITAMIN WITH MINERALS) TABS tablet Take 1 tablet by mouth daily. 12/21/21  Yes Amin, Ankit Chirag, MD  ondansetron (ZOFRAN) 8 MG tablet Take 1 tablet (8 mg total) by mouth every 8 (eight) hours as needed for nausea or vomiting. Start on the third day after cyclophosphamide chemotherapy. 07/26/22  Yes Johney Maine, MD  oxyCODONE (OXY IR/ROXICODONE) 5 MG immediate release tablet Take 1 tablet (5 mg total) by mouth every 6 (six) hours  as needed for severe pain. 07/19/22  Yes Johney Maine, MD  prochlorperazine (COMPAZINE) 10 MG tablet Take 1 tablet (10 mg total) by mouth every 6 (six) hours as needed for nausea or vomiting. 07/26/22  Yes Johney Maine, MD  senna-docusate (SENNA S) 8.6-50 MG tablet Take 2 tablets by mouth at bedtime as needed for mild constipation. 07/19/22  Yes Johney Maine, MD  allopurinol (ZYLOPRIM) 100 MG tablet TAKE 1 TABLET BY MOUTH TWICE A DAY 08/15/22   Johney Maine, MD  gabapentin (NEURONTIN) 100 MG capsule Take 1 capsule (100 mg total) by mouth at bedtime for 14 days, THEN 2 capsules (200 mg total) at bedtime. 05/31/22 07/14/22  Claudie Leach, DO    Current Facility-Administered Medications  Medication Dose Route Frequency Provider Last Rate Last Admin   0.9 %  sodium chloride infusion (Manually program via Guardrails IV Fluids)   Intravenous Once Leroy Sea, MD       0.9 %  sodium chloride infusion (Manually program via Guardrails IV Fluids)   Intravenous Once Leroy Sea, MD       0.9 %  sodium chloride infusion  250 mL Intravenous Continuous Hunsucker, Lesia Sago, MD   Held at 08/13/22 0021   0.9 %  sodium chloride infusion   Intravenous Continuous Delano Metz, MD 65 mL/hr at 08/20/22 1326 Rate Change at 08/20/22 1326   acetaminophen (TYLENOL) 160  MG/5ML solution 650 mg  650 mg Per Tube Once Hunsucker, Lesia Sago, MD       allopurinol (ZYLOPRIM) tablet 100 mg  100 mg Per Tube BID Hunsucker, Lesia Sago, MD   100 mg at 08/20/22 4098   amiodarone (PACERONE) tablet 200 mg  200 mg Per Tube Daily Hunsucker, Lesia Sago, MD   200 mg at 08/20/22 1191   Chlorhexidine Gluconate Cloth 2 % PADS 6 each  6 each Topical Q0600 Bufford Buttner, MD   6 each at 08/20/22 4782   docusate (COLACE) 50 MG/5ML liquid 100 mg  100 mg Per Tube BID PRN Hunsucker, Lesia Sago, MD       insulin aspart (novoLOG) injection 0-6 Units  0-6 Units Subcutaneous Q4H Hunsucker, Lesia Sago, MD   1  Units at 08/12/22 2312   ipratropium-albuterol (DUONEB) 0.5-2.5 (3) MG/3ML nebulizer solution 3 mL  3 mL Nebulization Q6H PRN Hunsucker, Lesia Sago, MD   3 mL at 08/06/22 0618   lip balm (CARMEX) ointment 1 Application  1 Application Topical PRN Hunsucker, Lesia Sago, MD       melatonin tablet 5 mg  5 mg Per Tube QHS PRN Hunsucker, Lesia Sago, MD   5 mg at 08/19/22 2057   multivitamin (RENA-VIT) tablet 1 tablet  1 tablet Oral QHS Leroy Sea, MD       ondansetron [facility]) tablet 4 mg  4 mg Per Tube Q6H PRN Hunsucker, Lesia Sago, MD       Or   ondansetron [facility]) injection 4 mg  4 mg Intravenous Q6H PRN Hunsucker, Lesia Sago, MD   4 mg at 08/16/22 1249   Oral care mouth rinse  15 mL Mouth Rinse 4 times per day Karren Burly, MD   15 mL at 08/20/22 0827   oxyCODONE (Oxy IR/ROXICODONE) immediate release tablet 5 mg  5 mg Per Tube Q4H PRN Hunsucker, Lesia Sago, MD   5 mg at 08/16/22 0321   pantoprazole (PROTONIX) injection 40 mg  40 mg Intravenous Q12H Leroy Sea, MD       phenol (CHLORASEPTIC) mouth spray 1 spray  1 spray Mouth/Throat PRN Hunsucker, Lesia Sago, MD   1 spray at 08/17/22 1738   polyethylene glycol (MIRALAX / GLYCOLAX) packet 17 g  17 g Per Tube Daily PRN Hunsucker, Lesia Sago, MD   17 g at 08/19/22 2057   polyvinyl alcohol (LIQUIFILM TEARS) 1.4 % ophthalmic solution 1 drop  1 drop Both Eyes PRN Leroy Sea, MD       senna (SENOKOT) tablet 17.2 mg  2 tablet Per NG tube Daily PRN Hunsucker, Lesia Sago, MD       sodium chloride flush (NS) 0.9 % injection 10-40 mL  10-40 mL Intracatheter PRN Hunsucker, Lesia Sago, MD   20 mL at 08/15/22 0830   traZODone (DESYREL) tablet 50 mg  50 mg Per Tube QHS PRN Hunsucker, Lesia Sago, MD   50 mg at 08/19/22 2057    Allergies as of 07/29/2022 - Review Complete 07/29/2022  Allergen Reaction Noted   Amitriptyline Other (See Comments) 07/08/2022   Amlodipine Swelling 02/25/2019   Neurontin [gabapentin] Other (See Comments) 07/08/2022     Review of Systems:    Constitutional: No weight loss, fever, chills, weakness or fatigue HEENT: Eyes: No change in vision               Ears, Nose, Throat:  No change in hearing or congestion Skin: No rash or itching Cardiovascular: No  chest pain, chest pressure or palpitations   Respiratory: No SOB or cough Gastrointestinal: See HPI and otherwise negative Genitourinary: No dysuria or change in urinary frequency Neurological: No headache, dizziness or syncope Musculoskeletal: No new muscle or joint pain Hematologic: No bleeding or bruising Psychiatric: No history of depression or anxiety     Physical Exam:  Vital signs in last 24 hours: Temp:  [97.6 F (36.4 C)-98.3 F (36.8 C)] 98 F (36.7 C) (06/25 0800) Pulse Rate:  [84-87] 87 (06/25 0800) Resp:  [16-24] 21 (06/25 0800) BP: (125-135)/(79-89) 135/81 (06/25 0800) SpO2:  [94 %-96 %] 96 % (06/25 0300) Weight:  [83.9 kg] 83.9 kg (06/25 0300) Last BM Date : 08/20/22 Last BM recorded by nurses in past 5 days Stool Type: Type 6 (Mushy consistency with ragged edges) (08/20/2022  9:00 AM)  General:   Pleasant, AA male in no acute distress Head:  Normocephalic and atraumatic. Eyes: scleral icterus versus pale,conjunctive pale  Heart:  regular rate and rhythm, with PVCs, rate controlled Pulm: Clear anteriorly; no wheezing Abdomen:  Soft, Obese AB, Sluggish bowel sounds. No tenderness , No organomegaly appreciated. Extremities:  Without edema. Msk:  Symmetrical without gross deformities. Peripheral pulses intact.  Neurologic:  Alert and  oriented x4;  No focal deficits.  Skin:   Dry and intact without significant lesions or rashes. Psychiatric:  Cooperative. Normal mood and affect.  LAB RESULTS: Recent Labs    08/19/22 0332 08/20/22 0302 08/20/22 1429  WBC 14.3* 12.3* 13.6*  HGB 7.0* 7.6* 6.4*  HCT 21.4* 23.5* 19.9*  PLT 131* 154 171   BMET Recent Labs    08/18/22 0436 08/19/22 0332 08/20/22 0302  NA 133* 132* 137   K 5.1 5.4* 4.0  CL 97* 95* 102  CO2 27 25 24   GLUCOSE 113* 88 78  BUN 66* 81* 45*  CREATININE 3.68* 4.90* 3.38*  CALCIUM 7.7* 8.1* 7.2*   LFT No results for input(s): "PROT", "ALBUMIN", "AST", "ALT", "ALKPHOS", "BILITOT", "BILIDIR", "IBILI" in the last 72 hours. PT/INR No results for input(s): "LABPROT", "INR" in the last 72 hours.  STUDIES: No results found.    Impression    72 year old male with very complicated medical history as well as prolonged and complicated hospital stay. 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 Started dialysis 6/10, intubated this admission, recent NG tube removed 6/24.  Acute on chronic anemia with positive FOBT stool 6/15 10 months prior negative, melenic stools at bedside. Not on anticoagulation for A-fib secondary to bleeding risk Previous abdominal discomfort, reflux 2 to 3 days  ARF On dialysis, received dialysis yesterday, will get dialysis tomorrow.  Diffuse large B-cell lymphoma Following with oncology  Atrial fibrillation Not on anticoagulation secondary to bleeding risk  Aspiration pneumonia with acute hypoxic respiratory failure Extubated 6/16 Finish antibiotics 6/18 2 L nasal cannula 620 03/2022 bedside swallow evaluation resume normal diet.  Principal Problem:   Sepsis (HCC) Active Problems:   Prostate cancer (HCC)   Hypertension   Paroxysmal atrial fibrillation (HCC)   Normocytic anemia   Hyponatremia   Diffuse large B-cell lymphoma of lymph nodes of multiple regions (HCC)   Anemia   At high risk of tumor lysis syndrome   Spontaneous tumor lysis syndrome   Acute renal failure with tubular necrosis (HCC)   Aspiration into airway   Tumor lysis syndrome   Chemotherapy-induced neutropenia (HCC)    LOS: 22 days     Plan   -  add on LFTs -Protonix 40 mg IV BID, increase today to BID -DYS 3 diet, NPO at  midnight. --Continue to monitor H&H with transfusion as needed to maintain hemoglobin greater than 7.  Getting 1 PRBC now, will need above 7 for EGD. -EGD planned for tomorrow after patient's dialysis to evaluate for esophagitis, gastritis, peptic ulcer disease in setting of ICU, H. pylori  I thoroughly discussed the procedure to include nature, alternatives, benefits, and risks including but not limited to bleeding, perforation, infection, anesthesia/cardiac and pulmonary complications. Patient provides understanding and gave verbal consent to proceed. -Patient's daugher is former NP, Sherron Flemings, # [phone], discussed plan with her on the phone Monitor kidney function prior to EGD for electrolyte disturbances.  Thank you for your kind consultation, we will continue to follow.   Doree Albee  08/20/2022, 3:20 PM

## 2022-08-20 NOTE — Progress Notes (Signed)
Mobility Specialist Progress Note   08/20/22 1626  Mobility  Activity Contraindicated/medical hold (Pt getting transfusion)   Pt currently receiving transfusion and RN requesting pt to remain in bed. Will f/u in the following day if tim permits.   Frederico Hamman Mobility Specialist Please contact via SecureChat or  Rehab office at [phone]

## 2022-08-20 NOTE — Progress Notes (Signed)
[facility] Progress Note  Subjective: UOP 2500 cc yesterday and 900 cc today so far. BPs are within normal range.   Vitals:   08/19/22 2200 08/20/22 0000 08/20/22 0300 08/20/22 0800  BP:  129/79 127/79 135/81  Pulse:   85 87  Resp: 20 18 (!) 21 (!) 21  Temp:  98 F (36.7 C) 98.3 F (36.8 C) 98 F (36.7 C)  TempSrc:  Oral Axillary Oral  SpO2:  95% 96%   Weight:   83.9 kg   Height:        Exam:   alert, nad   no jvd  Chest cta bilat  Cor reg no RG  Abd soft ntnd no ascites   Ext no LE edema   Alert, NF, ox3   RIJ temp HD cath      Date   Creat  eGFR  notes    July 2-17, 2021 0.87- 1.17 > 60 ml/min Isolated creat of 1.52 on 7/07 Dec 2021  0.69- 1.16 > 60 ml/min     6/03- 08, 2024 0.70- 1.01 > 40ml/min This admit    6/10- 24, 2024 1.36- 4.90   This admit         UA 6/3 - negative     UA 6/6 - negative     UA 6/11 - small bili, mod Hb, ket 5, prot 100, few bact, 6-10 rbc, 21-50 wbc, 0-5 epi      Assessment/ Plan: AKI - b/l creatinine 0.6- 0.9 here on admission. Creat increased up to 1.3 on 6/10 and 2.3 on 6/11.  UA showed ^wbc, min rbc's/ protein. Renal US showed no obstruction. Pt did had IV contrast exposure and some hypotension, but primarily AKI was more likely due to tumor lysis syndrome. Uric acid/ hyperkalemia peaked then resolved w/ CRRT and rasburicase. SP CRRT 6/13- 6/20 and has now transitioned to [facility] which he had on 6/22 and on 6/24. UOP has recovered over the last several days. Looked a bit dry on exam so started him on IVF"s 75 cc/hr yesterday. Will lower to 65 cc/hr and continue another 24 hrs. Will hold off on dialysis for the next 1-2 days, see if he is recovering renal function.  Tumor lysis syndrome - sp rasburicase 6mg  on 6/12 and another 6mg  on 6/13.  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 Ritux given 6/19. Lumps in the neck and L axilla have improved significantly.  Pancytopenia - leukopenia  and plts resolved, Hb remains low in 7- 8 range.  Parox atrial fib- on amiodarone      Vinson Moselle MD  [facility] 08/20/2022, 12:58 PM  Recent Labs  Lab 08/16/22 0423 08/17/22 0155 08/17/22 0805 08/18/22 0436 08/19/22 0332 08/20/22 0302  HGB 7.6*   < >  --    < > 7.0* 7.6*  ALBUMIN 1.8*  --  1.7*  --   --   --   CALCIUM 7.4*  --  7.6*   < > 8.1* 7.2*  PHOS 3.0  --  3.9  --   --   --   CREATININE 2.59*  --  4.17*   < > 4.90* 3.38*  K 5.0  --  5.8*   < > 5.4* 4.0   < > = values in this interval not displayed.    No results for input(s): "IRON", "TIBC", "FERRITIN" in the last 168 hours. Inpatient medications:  acetaminophen  650 mg  Per Tube Once   allopurinol  100 mg Per Tube BID   amiodarone  200 mg Per Tube Daily   Chlorhexidine Gluconate Cloth  6 each Topical Q0600   insulin aspart  0-6 Units Subcutaneous Q4H   multivitamin  1 tablet Oral QHS   mouth rinse  15 mL Mouth Rinse 4 times per day   pantoprazole (PROTONIX) IV  40 mg Intravenous Daily    sodium chloride Stopped (08/13/22 0021)   sodium chloride 75 mL/hr at 08/20/22 0529   docusate, ipratropium-albuterol, lip balm, melatonin, ondansetron **OR** ondansetron (ZOFRAN) IV, oxyCODONE, phenol, polyethylene glycol, polyvinyl alcohol, senna, sodium chloride flush, traZODone

## 2022-08-20 NOTE — Progress Notes (Signed)
SLP Cancellation Note  Patient Details Name: Kijuan Gallicchio MRN: [id number] DOB: Jan 02, 1951   Cancelled treatment:       Reason Eval/Treat Not Completed: Other (comment) (RN reports he is getting ready to go in with nurse tech and remove flexi seal to try BSC, poor intake as pt reports abdomen fullness, will continue efforts)  [name], MS [facility] SLP Acute Rehab Services Office [phone]  Chales Abrahams 08/20/2022, 9:20 AM

## 2022-08-20 NOTE — Consult Note (Addendum)
     Consultation  Referring Provider:   Dr. Singh Primary Care Physician:  Ntibrey, Daniel K, FNP Primary Gastroenterologist:  Unassigned       Reason for Consultation: Acute on chronic anemia and melena         HPI:   Tyler [name] is a 72 y.o. 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   10/2021 found to have pancytopenia and diagnosed with dysplastic syndrome, being transfused until 04/2022 when condition transformed into follicular B-cell lymphoma diffusely. Oncology was preparing for chemotherapy with  Port-A-Cath until patient admitted with sepsis on 06/03. He had hypotension, lactic acid 5 thrombocytopenia and anemia.  Hospital course complicated by seizure-like activity with right facial paresthesias. Some concern for tumor lysis syndrome, oncology following.  Patient had altered mental status, had possible aspiration pneumonia initially intubated, extubated 6/16 finished antibiotics 6/18. ARF  with PICC placed and R mini CHOP started 08/05/2022,  CRRT started 6/13  06/22 transferred from [facility]. 6/23 discontinued core track, patient on dysphagia 3 diet, speech following. 6/25 at 3 AM HGB 7.6, down to 6.4 after 1 episode of dark and tarry stools.   08/10/2022 positive FOBT, 11/2021 negative FOBT  Patient denies history of colonoscopy or endoscopy.  Denies family history of GI malignancy. Patient states last 2 to 3 days has been having worsening reflux and acid. Initial presentation had decreased p.o. intake and nausea however this may have been related to ARF and uremia, this is resolved with dialysis. Patient denies ever seeing melenic stools or  hematochezia until this morning had very dark tarry bowel movement. Patient denies abdominal pain but states he has been having more abdominal bloating especially after eating.  The patient states in May he was having abdominal discomfort with nausea and vomiting, having decreased bowel movements at that time but he states he is having good Bm's while in the hospital.  Patient states he was having some dysphagia when he had core track in place, this was discontinued 6/23. Patient denies NSAIDs, prednisone, smoking, drug history.  Patient states 10+ years ago he would drink wine occasionally but was never a heavy drinker.  WBC 13.6, compared to 6/22 WBC 2.2, platelets 171 compared to 131. BUN 45, creatinine 3.38, previous BUN 81 and 4.9- no acute elevation BNP 309 07/29/2022 CT and pelvis without contrast for abdominal pain showed no acute abdominal/pelvic process, diverticulosis without diverticulitis, numerous lymph nodes multiple sizes present throughout abdomen pelvis predominantly mesentery and periaortic region.  Normal liver, normal gallstone, mild generalized pancreatic atrophy no ductal dilation normal spleen. 07/15/2022 CT abdomen pelvis with contrast for right upper and lower abdominal discomfort 6 weeks with nausea and vomiting.  No bowel obstruction, no significant change in lymphadenopathy, 2.9 cm low-density lesion medial aspect of the spleen, lymphoma.  Showed moderate stool throughout colon normal TI.  Normal liver gallbladder.   Past Medical History:  Diagnosis Date   Cancer (HCC)    prostate   Follicular lymphoma (HCC) 2024   History of pulmonary embolism    Hypertension    Myelodysplastic syndrome (HCC)    PAF (paroxysmal atrial fibrillation) (HCC)    Recurrent UTI     Surgical History:  He  has a past surgical history that includes Robot assisted   laparoscopic radical prostatectomy (N/A, 09/01/2019) and Lymphadenectomy (Bilateral, 09/01/2019). Family History:  His family  history includes Stroke in his father. Social History:   reports that he has never smoked. He has never used smokeless tobacco. He reports current alcohol use. He reports that he does not use drugs.  Prior to Admission medications   Medication Sig Start Date End Date Taking? Authorizing Provider  B Complex Vitamins (VITAMIN B COMPLEX) TABS Take 1 tablet by mouth daily. 12/21/21  Yes Amin, Ankit Chirag, MD  cholecalciferol (VITAMIN D3) 25 MCG (1000 UNIT) tablet Take 2 tablets (2,000 Units total) by mouth daily. 01/21/22  Yes Kale, Gautam Kishore, MD  diclofenac Sodium (VOLTAREN) 1 % GEL APPLY 4 G TOPICALLY 4 (FOUR) TIMES DAILY. APPLY TO RIGHT LATERAL HIP AND THIGH DOWN TO KNEE Patient taking differently: Apply 4 g topically daily as needed (For pain). 06/12/22  Yes Neal, Sara L, MD  folic acid (FOLVITE) 1 MG tablet Take 1 mg by mouth daily. 03/27/22  Yes [provider]  lidocaine-prilocaine (EMLA) cream Apply to affected area once Patient taking differently: Apply 1 Application topically daily as needed (For port). 07/26/22  Yes Kale, Gautam Kishore, MD  lisinopril (ZESTRIL) 5 MG tablet Take 5 mg by mouth daily.   Yes [provider]  Menthol-Methyl Salicylate (MUSCLE RUB) 10-15 % CREA Apply 1 Application topically daily as needed for muscle pain.   Yes [provider]  metoCLOPramide (REGLAN) 10 MG tablet Take 1 tablet (10 mg total) by mouth every 6 (six) hours as needed for nausea. 07/15/22  Yes Lockwood, Robert, MD  Multiple Vitamin (MULTIVITAMIN WITH MINERALS) TABS tablet Take 1 tablet by mouth daily. 12/21/21  Yes Amin, Ankit Chirag, MD  ondansetron (ZOFRAN) 8 MG tablet Take 1 tablet (8 mg total) by mouth every 8 (eight) hours as needed for nausea or vomiting. Start on the third day after cyclophosphamide chemotherapy. 07/26/22  Yes Kale, Gautam Kishore, MD  oxyCODONE (OXY IR/ROXICODONE) 5 MG immediate release tablet Take 1 tablet (5 mg total) by mouth every 6 (six) hours  as needed for severe pain. 07/19/22  Yes Kale, Gautam Kishore, MD  prochlorperazine (COMPAZINE) 10 MG tablet Take 1 tablet (10 mg total) by mouth every 6 (six) hours as needed for nausea or vomiting. 07/26/22  Yes Kale, Gautam Kishore, MD  senna-docusate (SENNA S) 8.6-50 MG tablet Take 2 tablets by mouth at bedtime as needed for mild constipation. 07/19/22  Yes Kale, Gautam Kishore, MD  allopurinol (ZYLOPRIM) 100 MG tablet TAKE 1 TABLET BY MOUTH TWICE A DAY 08/15/22   Kale, Gautam Kishore, MD  gabapentin (NEURONTIN) 100 MG capsule Take 1 capsule (100 mg total) by mouth at bedtime for 14 days, THEN 2 capsules (200 mg total) at bedtime. 05/31/22 07/14/22  Tedrowe, Michelle A, DO    Current Facility-Administered Medications  Medication Dose Route Frequency Provider Last Rate Last Admin   0.9 %  sodium chloride infusion (Manually program via Guardrails IV Fluids)   Intravenous Once Singh, Prashant K, MD       0.9 %  sodium chloride infusion (Manually program via Guardrails IV Fluids)   Intravenous Once Singh, Prashant K, MD       0.9 %  sodium chloride infusion  250 mL Intravenous Continuous Hunsucker, Matthew R, MD   Held at 08/13/22 0021   0.9 %  sodium chloride infusion   Intravenous Continuous Schertz, Robert, MD 65 mL/hr at 08/20/22 1326 Rate Change at 08/20/22 1326   acetaminophen (TYLENOL) 160   MG/5ML solution 650 mg  650 mg Per Tube Once Hunsucker, Matthew R, MD       allopurinol (ZYLOPRIM) tablet 100 mg  100 mg Per Tube BID Hunsucker, Matthew R, MD   100 mg at 08/20/22 0822   amiodarone (PACERONE) tablet 200 mg  200 mg Per Tube Daily Hunsucker, Matthew R, MD   200 mg at 08/20/22 0822   Chlorhexidine Gluconate Cloth 2 % PADS 6 each  6 each Topical Q0600 Upton, Elizabeth, MD   6 each at 08/20/22 0822   docusate (COLACE) 50 MG/5ML liquid 100 mg  100 mg Per Tube BID PRN Hunsucker, Matthew R, MD       insulin aspart (novoLOG) injection 0-6 Units  0-6 Units Subcutaneous Q4H Hunsucker, Matthew R, MD   1  Units at 08/12/22 2312   ipratropium-albuterol (DUONEB) 0.5-2.5 (3) MG/3ML nebulizer solution 3 mL  3 mL Nebulization Q6H PRN Hunsucker, Matthew R, MD   3 mL at 08/06/22 0618   lip balm (CARMEX) ointment 1 Application  1 Application Topical PRN Hunsucker, Matthew R, MD       melatonin tablet 5 mg  5 mg Per Tube QHS PRN Hunsucker, Matthew R, MD   5 mg at 08/19/22 2057   multivitamin (RENA-VIT) tablet 1 tablet  1 tablet Oral QHS Singh, Prashant K, MD       ondansetron (ZOFRAN) tablet 4 mg  4 mg Per Tube Q6H PRN Hunsucker, Matthew R, MD       Or   ondansetron (ZOFRAN) injection 4 mg  4 mg Intravenous Q6H PRN Hunsucker, Matthew R, MD   4 mg at 08/16/22 1249   Oral care mouth rinse  15 mL Mouth Rinse 4 times per day Hunsucker, Matthew R, MD   15 mL at 08/20/22 0827   oxyCODONE (Oxy IR/ROXICODONE) immediate release tablet 5 mg  5 mg Per Tube Q4H PRN Hunsucker, Matthew R, MD   5 mg at 08/16/22 0321   pantoprazole (PROTONIX) injection 40 mg  40 mg Intravenous Q12H Singh, Prashant K, MD       phenol (CHLORASEPTIC) mouth spray 1 spray  1 spray Mouth/Throat PRN Hunsucker, Matthew R, MD   1 spray at 08/17/22 1738   polyethylene glycol (MIRALAX / GLYCOLAX) packet 17 g  17 g Per Tube Daily PRN Hunsucker, Matthew R, MD   17 g at 08/19/22 2057   polyvinyl alcohol (LIQUIFILM TEARS) 1.4 % ophthalmic solution 1 drop  1 drop Both Eyes PRN Singh, Prashant K, MD       senna (SENOKOT) tablet 17.2 mg  2 tablet Per NG tube Daily PRN Hunsucker, Matthew R, MD       sodium chloride flush (NS) 0.9 % injection 10-40 mL  10-40 mL Intracatheter PRN Hunsucker, Matthew R, MD   20 mL at 08/15/22 0830   traZODone (DESYREL) tablet 50 mg  50 mg Per Tube QHS PRN Hunsucker, Matthew R, MD   50 mg at 08/19/22 2057    Allergies as of 07/29/2022 - Review Complete 07/29/2022  Allergen Reaction Noted   Amitriptyline Other (See Comments) 07/08/2022   Amlodipine Swelling 02/25/2019   Neurontin [gabapentin] Other (See Comments) 07/08/2022     Review of Systems:    Constitutional: No weight loss, fever, chills, weakness or fatigue HEENT: Eyes: No change in vision               Ears, Nose, Throat:  No change in hearing or congestion Skin: No rash or itching Cardiovascular: No   chest pain, chest pressure or palpitations   Respiratory: No SOB or cough Gastrointestinal: See HPI and otherwise negative Genitourinary: No dysuria or change in urinary frequency Neurological: No headache, dizziness or syncope Musculoskeletal: No new muscle or joint pain Hematologic: No bleeding or bruising Psychiatric: No history of depression or anxiety     Physical Exam:  Vital signs in last 24 hours: Temp:  [97.6 F (36.4 C)-98.3 F (36.8 C)] 98 F (36.7 C) (06/25 0800) Pulse Rate:  [84-87] 87 (06/25 0800) Resp:  [16-24] 21 (06/25 0800) BP: (125-135)/(79-89) 135/81 (06/25 0800) SpO2:  [94 %-96 %] 96 % (06/25 0300) Weight:  [83.9 kg] 83.9 kg (06/25 0300) Last BM Date : 08/20/22 Last BM recorded by nurses in past 5 days Stool Type: Type 6 (Mushy consistency with ragged edges) (08/20/2022  9:00 AM)  General:   Pleasant, AA male in no acute distress Head:  Normocephalic and atraumatic. Eyes: scleral icterus versus pale,conjunctive pale  Heart:  regular rate and rhythm, with PVCs, rate controlled Pulm: Clear anteriorly; no wheezing Abdomen:  Soft, Obese AB, Sluggish bowel sounds. No tenderness , No organomegaly appreciated. Extremities:  Without edema. Msk:  Symmetrical without gross deformities. Peripheral pulses intact.  Neurologic:  Alert and  oriented x4;  No focal deficits.  Skin:   Dry and intact without significant lesions or rashes. Psychiatric:  Cooperative. Normal mood and affect.  LAB RESULTS: Recent Labs    08/19/22 0332 08/20/22 0302 08/20/22 1429  WBC 14.3* 12.3* 13.6*  HGB 7.0* 7.6* 6.4*  HCT 21.4* 23.5* 19.9*  PLT 131* 154 171   BMET Recent Labs    08/18/22 0436 08/19/22 0332 08/20/22 0302  NA 133* 132* 137   K 5.1 5.4* 4.0  CL 97* 95* 102  CO2 27 25 24  GLUCOSE 113* 88 78  BUN 66* 81* 45*  CREATININE 3.68* 4.90* 3.38*  CALCIUM 7.7* 8.1* 7.2*   LFT No results for input(s): "PROT", "ALBUMIN", "AST", "ALT", "ALKPHOS", "BILITOT", "BILIDIR", "IBILI" in the last 72 hours. PT/INR No results for input(s): "LABPROT", "INR" in the last 72 hours.  STUDIES: No results found.    Impression    72-year-old male with very complicated medical history as well as prolonged and complicated hospital stay. 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 Started dialysis 6/10, intubated this admission, recent NG tube removed 6/24.  Acute on chronic anemia with positive FOBT stool 6/15 10 months prior negative, melenic stools at bedside. Not on anticoagulation for A-fib secondary to bleeding risk Previous abdominal discomfort, reflux 2 to 3 days  ARF On dialysis, received dialysis yesterday, will get dialysis tomorrow.  Diffuse large B-cell lymphoma Following with oncology  Atrial fibrillation Not on anticoagulation secondary to bleeding risk  Aspiration pneumonia with acute hypoxic respiratory failure Extubated 6/16 Finish antibiotics 6/18 2 L nasal cannula 620 03/2022 bedside swallow evaluation resume normal diet.  Principal Problem:   Sepsis (HCC) Active Problems:   Prostate cancer (HCC)   Hypertension   Paroxysmal atrial fibrillation (HCC)   Normocytic anemia   Hyponatremia   Diffuse large B-cell lymphoma of lymph nodes of multiple regions (HCC)   Anemia   At high risk of tumor lysis syndrome   Spontaneous tumor lysis syndrome   Acute renal failure with tubular necrosis (HCC)   Aspiration into airway   Tumor lysis syndrome   Chemotherapy-induced neutropenia (HCC)    LOS: 22 days     Plan   -   add on LFTs -Protonix 40 mg IV BID, increase today to BID -DYS 3 diet, NPO at  midnight. --Continue to monitor H&H with transfusion as needed to maintain hemoglobin greater than 7.  Getting 1 PRBC now, will need above 7 for EGD. -EGD planned for tomorrow after patient's dialysis to evaluate for esophagitis, gastritis, peptic ulcer disease in setting of ICU, H. pylori  I thoroughly discussed the procedure to include nature, alternatives, benefits, and risks including but not limited to bleeding, perforation, infection, anesthesia/cardiac and pulmonary complications. Patient provides understanding and gave verbal consent to proceed. -Patient's daugher is former NP, Tyler [name], # [phone], discussed plan with her on the phone Monitor kidney function prior to EGD for electrolyte disturbances.  Thank you for your kind consultation, we will continue to follow.   Kimanh Templeman R Maziah Smola  08/20/2022, 3:20 PM  

## 2022-08-20 NOTE — Consult Note (Addendum)
Physical Medicine and Rehabilitation Consult Reason for Consult:Rehab Referring Physician: Dr. Thedore Mins   HPI: Tyler [name] is a 72 y.o. male with past medical history of high-grade large B cell lymphoma, myelodysplastic syndrome, history of prostate cancer, hypertension, history of PE, paroxysmal A-fib, history of recurrent UTIs who presented to the hospital with near syncope, found to have fever and hypotension with elevated lactic acid.  S/P mini CHOP on 6/11 followed by Dr. Candise Che oncology.  He also was found to have AKI, possibly due to tumor lysis syndrome.  He was treated with CRRT and HD treatments and is followed by nephrology.  He received blood transfusion for pancytopenia, goal greater than 7.5 Hgb.  Patient was found to have aspiration pneumonia and intubated, extubated 08/11/22.  SLP following.  Sepsis resolved with antibiotic and pressor support.  Core track was discontinued 6/23 due to discomfort and he is currently on DYS 3 thin diet.  Patient reports he feels generally weak from being in the hospital.  Patient reports sensation of abdominal fullness but says he had a bowel movement earlier today.  Patient was evaluated by PT and OT and found to have functional limitations.  Patient reports he lives on second floor apartment, wife can assist 24/7 after discharge  Review of Systems  Constitutional:  Negative for chills and fever.  HENT:  Negative for congestion.   Eyes:  Negative for double vision.  Respiratory:  Negative for shortness of breath.   Cardiovascular:  Negative for chest pain.  Gastrointestinal:  Positive for constipation. Negative for nausea and vomiting.  Musculoskeletal:  Negative for joint pain.  Neurological:  Positive for weakness. Negative for sensory change.   Past Medical History:  Diagnosis Date   Cancer [facility])    prostate   Follicular lymphoma (HCC) 2024   History of pulmonary embolism    Hypertension    Myelodysplastic syndrome (HCC)    PAF  (paroxysmal atrial fibrillation) (HCC)    Recurrent UTI    Past Surgical History:  Procedure Laterality Date   LYMPHADENECTOMY Bilateral 09/01/2019   Procedure: LYMPHADENECTOMY;  Surgeon: Sebastian Ache, MD;  Location: [facility];  Service: Urology;  Laterality: Bilateral;   ROBOT ASSISTED LAPAROSCOPIC RADICAL PROSTATECTOMY N/A 09/01/2019   Procedure: XI ROBOTIC ASSISTED LAPAROSCOPIC RADICAL PROSTATECTOMY;  Surgeon: Sebastian Ache, MD;  Location: [facility];  Service: Urology;  Laterality: N/A;  3 HRS   Family History  Problem Relation Age of Onset   Stroke Father    Social History:  reports that he has never smoked. He has never used smokeless tobacco. He reports current alcohol use. He reports that he does not use drugs. Allergies:  Allergies  Allergen Reactions   Amitriptyline Other (See Comments)    Nightmares, Night sweats, Migraines    Amlodipine Swelling    BILATERAL FEET TO ANKLES   Neurontin [Gabapentin] Other (See Comments)    Nightmares; Night sweats; Headache   Medications Prior to Admission  Medication Sig Dispense Refill   B Complex Vitamins (VITAMIN B COMPLEX) TABS Take 1 tablet by mouth daily. 30 tablet 0   cholecalciferol (VITAMIN D3) 25 MCG (1000 UNIT) tablet Take 2 tablets (2,000 Units total) by mouth daily.     diclofenac Sodium (VOLTAREN) 1 % GEL APPLY 4 G TOPICALLY 4 (FOUR) TIMES DAILY. APPLY TO RIGHT LATERAL HIP AND THIGH DOWN TO KNEE (Patient taking differently: Apply 4 g topically daily as needed (For pain).) 300 g 1   folic acid (FOLVITE) 1 MG tablet  Take 1 mg by mouth daily.     lidocaine-prilocaine (EMLA) cream Apply to affected area once (Patient taking differently: Apply 1 Application topically daily as needed (For port).) 30 g 3   lisinopril (ZESTRIL) 5 MG tablet Take 5 mg by mouth daily.     Menthol-Methyl Salicylate (MUSCLE RUB) 10-15 % CREA Apply 1 Application topically daily as needed for muscle pain.     metoCLOPramide (REGLAN) 10 MG tablet Take 1  tablet (10 mg total) by mouth every 6 (six) hours as needed for nausea. 30 tablet 0   Multiple Vitamin (MULTIVITAMIN WITH MINERALS) TABS tablet Take 1 tablet by mouth daily.     ondansetron (ZOFRAN) 8 MG tablet Take 1 tablet (8 mg total) by mouth every 8 (eight) hours as needed for nausea or vomiting. Start on the third day after cyclophosphamide chemotherapy. 30 tablet 1   oxyCODONE (OXY IR/ROXICODONE) 5 MG immediate release tablet Take 1 tablet (5 mg total) by mouth every 6 (six) hours as needed for severe pain. 30 tablet 0   prochlorperazine (COMPAZINE) 10 MG tablet Take 1 tablet (10 mg total) by mouth every 6 (six) hours as needed for nausea or vomiting. 30 tablet 6   senna-docusate (SENNA S) 8.6-50 MG tablet Take 2 tablets by mouth at bedtime as needed for mild constipation. 60 tablet 1   gabapentin (NEURONTIN) 100 MG capsule Take 1 capsule (100 mg total) by mouth at bedtime for 14 days, THEN 2 capsules (200 mg total) at bedtime. 75 capsule 0    Home: Home Living Family/patient expects to be discharged to:: Private residence Living Arrangements: Spouse/significant other Available Help at Discharge: Family Type of Home: Apartment (apartment on the 2nd floor) Home Access: Stairs to enter Secretary/administrator of Steps: flight Entrance Stairs-Rails: Right, Left Home Layout: One level Bathroom Shower/Tub: Engineer, manufacturing systems: Standard Bathroom Accessibility: Yes Home Equipment: Agricultural consultant (2 wheels), BSC/3in1  Functional History: Prior Function Prior Level of Function : Independent/Modified Independent, Working/employed Mobility Comments: Per chart review, Independent to Mod I with occasional use of RW. Pt works. ADLs Comments: Per chart review, largely Independent to Mod I with occasional assistance from spouse. Functional Status:  Mobility: Bed Mobility Overal bed mobility: Needs Assistance Bed Mobility: Supine to Sit Rolling: Max assist, Total assist Supine to  sit: HOB elevated, Min guard Sit to supine: Total assist, +2 for physical assistance General bed mobility comments: cues for hand placement and technique, with increased time, min guard for safety Transfers Overall transfer level: Needs assistance Equipment used: Rolling walker (2 wheels) Transfers: Sit to/from Stand, Bed to chair/wheelchair/BSC Sit to Stand: Mod assist, +2 safety/equipment Bed to/from chair/wheelchair/BSC transfer type:: Step pivot Step pivot transfers: Mod assist, +2 safety/equipment General transfer comment: Pt needing cues to scoot to edge and for placement of hands, modA to power up to stand multiple times from EOB to RW. ModA for balance and RW management to step pivot to R bed > recliner. +2 for safety. Noted bil knee buckling but pt able to push through RW to recover Ambulation/Gait Ambulation/Gait assistance: Mod assist, +2 safety/equipment Gait Distance (Feet): 2 Feet Assistive device: Rolling walker (2 wheels) Gait Pattern/deviations: Decreased step length - right, Decreased step length - left, Decreased stride length, Knees buckling, Trunk flexed General Gait Details: Pt takes slow, small, unsteady steps with bil knees buckling and pt heavily relying on UEs to push on RW to recover with knees buckling, modA for balance and RW management, +2 for safety Gait velocity:  reduced Gait velocity interpretation: <1.31 ft/sec, indicative of household ambulator    ADL: ADL Overall ADL's : Needs assistance/impaired Eating/Feeding: Set up, Sitting Grooming: Min guard, Cueing for sequencing, Cueing for safety, Sitting Upper Body Bathing: Moderate assistance, Sitting, Cueing for safety, Cueing for sequencing, Cueing for compensatory techniques Lower Body Bathing: Maximal assistance, Sitting/lateral leans, +2 for safety/equipment, Cueing for safety, Cueing for sequencing, Cueing for compensatory techniques Upper Body Dressing : Moderate assistance, Sitting, Cueing for safety,  Cueing for sequencing Upper Body Dressing Details (indicate cue type and reason): simulated seated EOB Lower Body Dressing: Maximal assistance, Sitting/lateral leans, +2 for safety/equipment, Cueing for safety, Cueing for sequencing, Cueing for compensatory techniques Lower Body Dressing Details (indicate cue type and reason): able to static stand for pant mangement Toilet Transfer: Moderate assistance, +2 for safety/equipment, Cueing for safety, Cueing for sequencing, Stand-pivot, BSC/3in1, Rolling walker (2 wheels) Toilet Transfer Details (indicate cue type and reason): transfer to right, unable to off load L foot to step L, but he could step R Toileting- Clothing Manipulation and Hygiene: Maximal assistance, +2 for safety/equipment, Sit to/from stand, Sitting/lateral lean Functional mobility during ADLs: Maximal assistance, +2 for physical assistance General ADL Comments: Functional mobility deferred this day for pt/therapist safety.  Cognition: Cognition Overall Cognitive Status: Difficult to assess Orientation Level: Oriented X4 Cognition Arousal/Alertness: Awake/alert Behavior During Therapy: WFL for tasks assessed/performed Overall Cognitive Status: Difficult to assess Area of Impairment: Following commands Current Attention Level: Sustained Following Commands: Follows one step commands inconsistently, Follows one step commands with increased time Awareness: Intellectual Problem Solving: Slow processing General Comments: Pt requiring increased time for processing. Pt demonstrates ability to follow 1 step commands consistantly with increased time and visual cues. Difficult to assess due to: Non-English speaking  Blood pressure 135/81, pulse 87, temperature 98 F (36.7 C), temperature source Oral, resp. rate (!) 21, height 5\' 7"  (1.702 m), weight 83.9 kg, SpO2 96 %. Physical Exam   General: Alert and oriented x 3, No apparent distress HEENT: Head is normocephalic, atraumatic,  PERRLA, EOMI, sclera anicteric, oral mucosa pink and moist, dentition intact, ext ear canals clear,  Neck: Supple without JVD or lymphadenopathy Heart: Reg rate and rhythm. No murmurs rubs or gallops Chest: CTA bilaterally without wheezes, rales, or rhonchi; no distress Abdomen: Soft, non-tender, moderately-distended, bowel sounds positive. Extremities: No clubbing, cyanosis, or edema. Pulses are 2+ Psych: Pt's affect is appropriate. Pt is cooperative Skin: Clean and intact without signs of breakdown Neuro:  Alert and oriented x4, follows commands, CN 2-12 intact, normal speech and language Strength 5/5 in b/l UE  Strength 4/5 proximal , 4+/5 distal LE Sensation intact to LT in all 4 extremities FTN intact b/l Musculoskeletal: NO joint swelling or tenderness noted No abnormal tone  IJ right and right PICC line in place   Results for orders placed or performed during the hospital encounter of 07/29/22 (from the past 24 hour(s))  Glucose, capillary     Status: None   Collection Time: 08/19/22  4:37 PM  Result Value Ref Range   Glucose-Capillary 92 70 - 99 mg/dL  Glucose, capillary     Status: Abnormal   Collection Time: 08/19/22  8:09 PM  Result Value Ref Range   Glucose-Capillary 113 (H) 70 - 99 mg/dL  Glucose, capillary     Status: Abnormal   Collection Time: 08/20/22 12:35 AM  Result Value Ref Range   Glucose-Capillary 68 (L) 70 - 99 mg/dL  CBC with Differential/Platelet     Status: Abnormal  Collection Time: 08/20/22  3:02 AM  Result Value Ref Range   WBC 12.3 (H) 4.0 - 10.5 K/uL   RBC 2.71 (L) 4.22 - 5.81 MIL/uL   Hemoglobin 7.6 (L) 13.0 - 17.0 g/dL   HCT 31.5 (L) 17.6 - 16.0 %   MCV 86.7 80.0 - 100.0 fL   MCH 28.0 26.0 - 34.0 pg   MCHC 32.3 30.0 - 36.0 g/dL   RDW 73.7 (H) 10.6 - 26.9 %   Platelets 154 150 - 400 K/uL   nRBC 0.0 0.0 - 0.2 %   Neutrophils Relative % 84 %   Neutro Abs 10.3 (H) 1.7 - 7.7 K/uL   Lymphocytes Relative 2 %   Lymphs Abs 0.2 (L) 0.7 - 4.0  K/uL   Monocytes Relative 11 %   Monocytes Absolute 1.4 (H) 0.1 - 1.0 K/uL   Eosinophils Relative 0 %   Eosinophils Absolute 0.0 0.0 - 0.5 K/uL   Basophils Relative 2 %   Basophils Absolute 0.2 (H) 0.0 - 0.1 K/uL   nRBC 0 0 /100 WBC   Myelocytes 1 %   Abs Immature Granulocytes 0.10 (H) 0.00 - 0.07 K/uL  Brain natriuretic peptide     Status: Abnormal   Collection Time: 08/20/22  3:02 AM  Result Value Ref Range   B Natriuretic Peptide 309.2 (H) 0.0 - 100.0 pg/mL  Basic metabolic panel     Status: Abnormal   Collection Time: 08/20/22  3:02 AM  Result Value Ref Range   Sodium 137 135 - 145 mmol/L   Potassium 4.0 3.5 - 5.1 mmol/L   Chloride 102 98 - 111 mmol/L   CO2 24 22 - 32 mmol/L   Glucose, Bld 78 70 - 99 mg/dL   BUN 45 (H) 8 - 23 mg/dL   Creatinine, Ser 4.85 (H) 0.61 - 1.24 mg/dL   Calcium 7.2 (L) 8.9 - 10.3 mg/dL   GFR, Estimated 19 (L) >60 mL/min   Anion gap 11 5 - 15  Magnesium     Status: Abnormal   Collection Time: 08/20/22  3:02 AM  Result Value Ref Range   Magnesium 1.6 (L) 1.7 - 2.4 mg/dL  Glucose, capillary     Status: Abnormal   Collection Time: 08/20/22  3:28 AM  Result Value Ref Range   Glucose-Capillary 68 (L) 70 - 99 mg/dL  Glucose, capillary     Status: None   Collection Time: 08/20/22  6:30 AM  Result Value Ref Range   Glucose-Capillary 76 70 - 99 mg/dL  Glucose, capillary     Status: None   Collection Time: 08/20/22  8:32 AM  Result Value Ref Range   Glucose-Capillary 85 70 - 99 mg/dL   No results found.  Assessment/Plan: Diagnosis: Debility due to aspiration pneumonia with sepsis and acute respiratory failure Does the need for close, 24 hr/day medical supervision in concert with the patient's rehab needs make it unreasonable for this patient to be served in a less intensive setting? Yes Co-Morbidities requiring supervision/potential complications:  -Acute renal failure, high-grade follicular B-cell lymphoma, pancytopenia, tumor lysis syndrome,  metabolic encephalopathy, dysphagia, paroxysmal A-fib, history of prostate cancer, history of PE, HTN Due to bladder management, bowel management, safety, skin/wound care, disease management, medication administration, pain management, and patient education, does the patient require 24 hr/day rehab nursing? Yes Does the patient require coordinated care of a physician, rehab nurse, therapy disciplines of PT OT and SLP to address physical and functional deficits in the context of the above  medical diagnosis(es)? Yes Addressing deficits in the following areas: balance, endurance, locomotion, strength, transferring, bowel/bladder control, bathing, dressing, feeding, grooming, toileting, cognition, speech, language, swallowing, and psychosocial support Can the patient actively participate in an intensive therapy program of at least 3 hrs of therapy per day at least 5 days per week? Potentially The potential for patient to make measurable gains while on inpatient rehab is good Anticipated functional outcomes upon discharge from inpatient rehab are modified independent and supervision  with PT, modified independent and supervision with OT, modified independent with SLP. If additional chemo dose is required, may need decrease in goals.  Estimated rehab length of stay to reach the above functional goals is: 10-12 Anticipated discharge destination: Home Overall Rehab/Functional Prognosis: good  POST ACUTE RECOMMENDATIONS: This patient's condition is appropriate for continued rehabilitative care in the following setting: CIR Patient has agreed to participate in recommended program. Yes Note that insurance prior authorization may be required for reimbursement for recommended care.  Comment: I think patient would be a good candidate for CIR when medically stable if no conflict with oncological treatment plans.  Will message Dr. Candise Che regarding current plan.  Update: discussed with oncology, likely to need  chemotherapy early next week around 7/2, I think he may have a hard time tolerating CIR, NSF may be better option  I have personally performed a face to face diagnostic evaluation of this patient. Additionally, I have examined the patient's medical record including any pertinent labs and radiographic images. If the physician assistant has documented in this note, I have reviewed and edited or otherwise concur with the physician assistant's documentation.  Thanks,  Fanny Dance, MD 08/20/2022

## 2022-08-20 NOTE — Progress Notes (Signed)
Nutrition Follow-up  DOCUMENTATION CODES:   Not applicable  INTERVENTION:  Discontinue B complex with Vitamin C, replace with Renal Multivitamin w/ minerals daily Discontinue enteral regimen due to no access Encourage good PO intake  Meal ordering with assist  NUTRITION DIAGNOSIS:  Increased nutrient needs related to chronic illness (lymphoma) as evidenced by estimated needs. - Ongoing   GOAL:  Patient will meet greater than or equal to 90% of their needs - Ongoing  MONITOR:  PO intake, Labs, Weight trends, I & O's  REASON FOR ASSESSMENT:  Consult Enteral/tube feeding initiation and management  ASSESSMENT:  72 y.o. 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 Admitted for sepsis of unknown cause.  6/3 - Admit 6/5 - Regular diet 6/10 - patient vomited, made NPO, NGT placed for decompression 6/13 - intubated, started CRRT, RD consult received to start trickle tube feeds of Vital HP at 20 ml/hr. 6/14 - switched to Vital 1.5 6/16 - Extubated 6/19 - Reached goal TF of 4mL/hr 6/20 - CRRT stopped 6/21 - transferred to [facility] for iHD 6/22 - diet advanced to Dysphagia 3, thin liquids  6/23 - NG tube removed 6/26 - EGD  Pt laying in bed, family at bedside. Pt declined the use of interpreter. Reports eating ok. Happy the NG- tube is removed.  After pt EGD, pt diet was advanced to HH/CM diet; reached out to MD due to pt previous diet was dysphagia 3. MD downgraded diet/   Per Nephrology's note, holding on further dialysis to assess for kidney recovery.   Meal Intake 6/22: 25% x 1 meal  Medications reviewed and include: NovoLog SSI, Rena-vit, Protonix Labs reviewed: Sodium 132, Potassium 4.2, BUN 55, Creatinine 3.96, Magnesium 1.5 CBG:  78-112 x 24 hrs  HD on 6/22 Net UF: 1600  mL Pre-HD weight: 82.2 kg Post-HD weight: 79.9 kg  UOP: 2225 mL x 24 hrs  Diet Order:   Diet Order             DIET DYS 3 Room service appropriate? Yes; Fluid consistency: Thin  Diet effective now                  EDUCATION NEEDS: No education needs have been identified at this time  Skin:  Skin Assessment: Reviewed RN Assessment  Last BM:  6/25  Height:  Ht Readings from Last 1 Encounters:  08/01/22 5\' 7"  (1.702 m)   Weight:  Wt Readings from Last 1 Encounters:  08/21/22 84.6 kg   Ideal Body Weight:  67.27 kg  BMI:  Body mass index is 29.21 kg/m.  Estimated Nutritional Needs:  Kcal:  2100-2300 kcals Protein:  95-115 grams Fluid:  >/= 2.1L or per MD   Kirby Crigler RD, LDN Clinical Dietitian See [facility] for contact information.

## 2022-08-21 ENCOUNTER — Inpatient Hospital Stay (HOSPITAL_COMMUNITY): Payer: 59 | Admitting: Anesthesiology

## 2022-08-21 ENCOUNTER — Encounter (HOSPITAL_COMMUNITY): Payer: Self-pay | Admitting: Internal Medicine

## 2022-08-21 ENCOUNTER — Encounter (HOSPITAL_COMMUNITY)
Admission: EM | Disposition: A | Discharge status: Skilled Nursing Facility | Payer: Self-pay | Source: Home / Self Care | Attending: Family Medicine

## 2022-08-21 DIAGNOSIS — K259 Gastric ulcer, unspecified as acute or chronic, without hemorrhage or perforation: Secondary | ICD-10-CM

## 2022-08-21 DIAGNOSIS — K921 Melena: Secondary | ICD-10-CM | POA: Diagnosis not present

## 2022-08-21 DIAGNOSIS — I10 Essential (primary) hypertension: Secondary | ICD-10-CM

## 2022-08-21 DIAGNOSIS — K254 Chronic or unspecified gastric ulcer with hemorrhage: Secondary | ICD-10-CM

## 2022-08-21 DIAGNOSIS — R768 Other specified abnormal immunological findings in serum: Secondary | ICD-10-CM

## 2022-08-21 DIAGNOSIS — N170 Acute kidney failure with tubular necrosis: Secondary | ICD-10-CM | POA: Diagnosis not present

## 2022-08-21 DIAGNOSIS — D649 Anemia, unspecified: Secondary | ICD-10-CM

## 2022-08-21 DIAGNOSIS — N179 Acute kidney failure, unspecified: Secondary | ICD-10-CM

## 2022-08-21 DIAGNOSIS — D4620 Refractory anemia with excess of blasts, unspecified: Secondary | ICD-10-CM

## 2022-08-21 DIAGNOSIS — C8510 Unspecified B-cell lymphoma, unspecified site: Secondary | ICD-10-CM

## 2022-08-21 DIAGNOSIS — K253 Acute gastric ulcer without hemorrhage or perforation: Secondary | ICD-10-CM | POA: Diagnosis present

## 2022-08-21 HISTORY — PX: ESOPHAGOGASTRODUODENOSCOPY (EGD) WITH PROPOFOL: SHX5813

## 2022-08-21 HISTORY — PX: BIOPSY: SHX5522

## 2022-08-21 LAB — TYPE AND SCREEN
Unit division: 0
Unit division: 0
Unit division: 0

## 2022-08-21 LAB — CBC WITH DIFFERENTIAL/PLATELET
Abs Immature Granulocytes: 0.85 10*3/uL — ABNORMAL HIGH (ref 0.00–0.07)
Basophils Absolute: 0.1 10*3/uL (ref 0.0–0.1)
Basophils Relative: 0 %
Eosinophils Absolute: 0.1 10*3/uL (ref 0.0–0.5)
Eosinophils Relative: 1 %
HCT: 23.5 % — ABNORMAL LOW (ref 39.0–52.0)
Hemoglobin: 7.8 g/dL — ABNORMAL LOW (ref 13.0–17.0)
Immature Granulocytes: 7 %
Lymphocytes Relative: 11 %
Lymphs Abs: 1.3 10*3/uL (ref 0.7–4.0)
MCH: 28.9 pg (ref 26.0–34.0)
MCHC: 33.2 g/dL (ref 30.0–36.0)
MCV: 87 fL (ref 80.0–100.0)
Monocytes Absolute: 2.1 10*3/uL — ABNORMAL HIGH (ref 0.1–1.0)
Monocytes Relative: 18 %
Neutro Abs: 7.7 10*3/uL (ref 1.7–7.7)
Neutrophils Relative %: 63 %
Platelets: 152 10*3/uL (ref 150–400)
RBC: 2.7 MIL/uL — ABNORMAL LOW (ref 4.22–5.81)
RDW: 15.3 % (ref 11.5–15.5)
WBC: 12.1 10*3/uL — ABNORMAL HIGH (ref 4.0–10.5)
nRBC: 0 % (ref 0.0–0.2)

## 2022-08-21 LAB — BASIC METABOLIC PANEL
Anion gap: 10 (ref 5–15)
BUN: 55 mg/dL — ABNORMAL HIGH (ref 8–23)
CO2: 21 mmol/L — ABNORMAL LOW (ref 22–32)
Calcium: 7.1 mg/dL — ABNORMAL LOW (ref 8.9–10.3)
Chloride: 101 mmol/L (ref 98–111)
Creatinine, Ser: 3.96 mg/dL — ABNORMAL HIGH (ref 0.61–1.24)
GFR, Estimated: 16 mL/min — ABNORMAL LOW (ref >60)
Glucose, Bld: 99 mg/dL (ref 70–99)
Potassium: 4.2 mmol/L (ref 3.5–5.1)
Sodium: 132 mmol/L — ABNORMAL LOW (ref 135–145)

## 2022-08-21 LAB — BPAM RBC
Blood Product Expiration Date: 202407142359
Blood Product Expiration Date: 202407202359
ISSUE DATE / TIME: 202406221341
ISSUE DATE / TIME: 202406252107
Unit Type and Rh: 5100
Unit Type and Rh: 5100

## 2022-08-21 LAB — MAGNESIUM: Magnesium: 1.5 mg/dL — ABNORMAL LOW (ref 1.7–2.4)

## 2022-08-21 LAB — GLUCOSE, CAPILLARY
Glucose-Capillary: 107 mg/dL — ABNORMAL HIGH (ref 70–99)
Glucose-Capillary: 112 mg/dL — ABNORMAL HIGH (ref 70–99)
Glucose-Capillary: 78 mg/dL (ref 70–99)
Glucose-Capillary: 83 mg/dL (ref 70–99)
Glucose-Capillary: 86 mg/dL (ref 70–99)
Glucose-Capillary: 90 mg/dL (ref 70–99)

## 2022-08-21 LAB — BRAIN NATRIURETIC PEPTIDE: B Natriuretic Peptide: 142.7 pg/mL — ABNORMAL HIGH (ref 0.0–100.0)

## 2022-08-21 SURGERY — ESOPHAGOGASTRODUODENOSCOPY (EGD) WITH PROPOFOL
Anesthesia: Monitor Anesthesia Care

## 2022-08-21 MED ORDER — PROPOFOL 10 MG/ML IV BOLUS
INTRAVENOUS | Status: DC | PRN
  Administered 2022-08-21: 20 mg via INTRAVENOUS

## 2022-08-21 MED ORDER — PROPOFOL 500 MG/50ML IV EMUL
INTRAVENOUS | Status: DC | PRN
  Administered 2022-08-21: 100 ug/kg/min via INTRAVENOUS

## 2022-08-21 MED ORDER — PANTOPRAZOLE SODIUM 40 MG PO TBEC
40.0000 mg | DELAYED_RELEASE_TABLET | Freq: Two times a day (BID) | ORAL | Status: DC
  Administered 2022-08-21 – 2022-08-27 (×13): 40 mg via ORAL
  Filled 2022-08-21 (×13): qty 1

## 2022-08-21 MED ORDER — LIDOCAINE 2% (20 MG/ML) 5 ML SYRINGE
INTRAMUSCULAR | Status: DC | PRN
  Administered 2022-08-21: 50 mg via INTRAVENOUS

## 2022-08-21 MED ORDER — TENOFOVIR ALAFENAMIDE FUMARATE 25 MG PO TABS
25.0000 mg | ORAL_TABLET | Freq: Every day | ORAL | Status: DC
  Administered 2022-08-21 – 2022-08-28 (×8): 25 mg via ORAL
  Filled 2022-08-21 (×8): qty 1

## 2022-08-21 SURGICAL SUPPLY — 15 items

## 2022-08-21 NOTE — Op Note (Signed)
[facility] Patient Name: Tyler [name] Procedure Date : 08/21/2022 MRN: [id number] Attending MD: Beverley Fiedler , MD, [id number] Date of Birth: 1950-07-14 CSN: [id number] Age: 72 Admit Type: Inpatient Procedure:                Upper GI endoscopy Indications:              Melena Providers:                Carie Caddy. Rhea Belton, MD, Dartha Lodge, RN, Rozetta Nunnery, Technician Referring MD:             Johney Maine Medicines:                Monitored Anesthesia Care Complications:            No immediate complications. Estimated Blood Loss:     Estimated blood loss was minimal. Procedure:                Pre-Anesthesia Assessment:                           - Prior to the procedure, a History and Physical                            was performed, and patient medications and                            allergies were reviewed. The patient's tolerance of                            previous anesthesia was also reviewed. The risks                            and benefits of the procedure and the sedation                            options and risks were discussed with the patient.                            All questions were answered, and informed consent                            was obtained. Prior Anticoagulants: The patient has                            taken no anticoagulant or antiplatelet agents. ASA                            Grade Assessment: III - A patient with severe                            systemic disease. After reviewing the risks and  benefits, the patient was deemed in satisfactory                            condition to undergo the procedure.                           After obtaining informed consent, the endoscope was                            passed under direct vision. Throughout the                            procedure, the patient's blood pressure, pulse, and                            oxygen  saturations were monitored continuously. The                            [id number]-H190 ([id number] Olympus endoscope was introduced                            through the mouth, and advanced to the third part                            of duodenum. The upper GI endoscopy was                            accomplished without difficulty. The patient                            tolerated the procedure well. Scope In: Scope Out: Findings:      The examined esophagus was normal.      One non-bleeding cratered gastric ulcer with no stigmata of bleeding was       found on the posterior wall of the gastric body. The lesion was 6 mm in       largest dimension. Biopsies were taken with a cold forceps for histology       and Helicobacter pylori testing.      The exam of the stomach was otherwise normal.      The examined duodenum was normal. Impression:               - Normal esophagus.                           - Non-bleeding gastric ulcer with no stigmata of                            bleeding. Biopsied.                           - Normal examined duodenum. Recommendation:           - Return patient to hospital ward for ongoing care.                           - Advance  diet as tolerated.                           - Continue present medications. BID PPI x 12 weeks.                           - Await pathology results.                           - GI will sign off, but follow-up pathology                            results. Call if questions. Procedure Code(s):        --- Professional ---                           [phone], Esophagogastroduodenoscopy, flexible,                            transoral; with biopsy, single or multiple Diagnosis Code(s):        --- Professional ---                           K25.9, Gastric ulcer, unspecified as acute or                            chronic, without hemorrhage or perforation                           K92.1, Melena (includes Hematochezia) CPT copyright 2022 American  [employer] Association. All rights reserved. The codes documented in this report are preliminary and upon coder review may  be revised to meet current compliance requirements. Beverley Fiedler, MD 08/21/2022 1:46:49 PM This report has been signed electronically. Number of Addenda: 0

## 2022-08-21 NOTE — [place] FL2 (Signed)
[place] MEDICAID FL2 LEVEL OF CARE FORM     IDENTIFICATION  Patient Name: Tyler [name] Birthdate: [birth year] Sex: male Admission Date (Current Location): 07/29/2022  [facility] and [place] Number:  Producer, television/film/video and Address:  The [place], [address] [place], [place] [address]      Provider Number: [id number]  Attending Physician Name and Address:  Lurene Shadow, MD  Relative Name and Phone Number:       Current Level of Care: Hospital Recommended Level of Care: Skilled Nursing Facility Prior Approval Number:    Date Approved/Denied:   PASRR Number: [id number]  Discharge Plan: SNF    Current Diagnoses: Patient Active Problem List   Diagnosis Date Noted   Melena 08/21/2022   Acute gastric ulcer 08/21/2022   Heme positive stool 08/20/2022   Chemotherapy-induced neutropenia (HCC) 08/13/2022   Tumor lysis syndrome 08/09/2022   Spontaneous tumor lysis syndrome 08/08/2022   Acute renal failure with tubular necrosis (HCC) 08/08/2022   Aspiration into airway 08/08/2022   At high risk of tumor lysis syndrome 08/07/2022   Anemia 08/06/2022   Diffuse large B-cell lymphoma of lymph nodes of multiple regions (HCC) 07/25/2022   Encounter for antineoplastic chemotherapy 07/25/2022   Lumbar radiculopathy 05/31/2022   Symptomatic anemia    UTI (urinary tract infection) 12/10/2021   Sepsis (HCC) 12/10/2021   Normocytic anemia 12/10/2021   Thrombocytopenia (HCC) 12/10/2021   Hyponatremia 12/10/2021   Sepsis secondary to UTI (HCC) 12/10/2021   Pelvic hematoma in male 09/08/2019   Bilateral pulmonary embolism (HCC) 09/05/2019   Paroxysmal atrial fibrillation (HCC) 09/05/2019   Hypertension    Hypokalemia    Prostate cancer (HCC) 09/01/2019    Orientation RESPIRATION BLADDER Height & Weight     Self, Time, Situation, Place  Normal Incontinent, Indwelling catheter Weight: 186 lb 8.2 oz (84.6 kg) Height:  5\' 7"  (170.2 cm)  BEHAVIORAL  SYMPTOMS/MOOD NEUROLOGICAL BOWEL NUTRITION STATUS      Continent Diet (DC Summary)  AMBULATORY STATUS COMMUNICATION OF NEEDS Skin   Extensive Assist Verbally Normal                       Personal Care Assistance Level of Assistance  Bathing, Feeding, Dressing Bathing Assistance: Maximum assistance Feeding assistance: Limited assistance Dressing Assistance: Limited assistance     Functional Limitations Info  Sight Sight Info: Impaired        SPECIAL CARE FACTORS FREQUENCY  PT (By licensed PT), OT (By licensed OT)     PT Frequency: 5x/week OT Frequency: 5x/week            Contractures Contractures Info: Not present    Additional Factors Info  Code Status, Allergies, Insulin Sliding Scale, Isolation Precautions Code Status Info: Full Allergies Info: Amitriptyline, Amlodipine, Neurontin (Gabapentin)   Insulin Sliding Scale Info: See dc summary Isolation Precautions Info: Chemo protective precautions     Current Medications (08/21/2022):  This is the current hospital active medication list Current Facility-Administered Medications  Medication Dose Route Frequency Provider Last Rate Last Admin   0.9 %  sodium chloride infusion  250 mL Intravenous Continuous Hunsucker, Lesia Sago, MD   Held at 08/13/22 0021   0.9 %  sodium chloride infusion   Intravenous Continuous Delano Metz, MD 65 mL/hr at 08/21/22 1313 Restarted at 08/21/22 1334   acetaminophen (TYLENOL) 160 MG/5ML solution 650 mg  650 mg Per Tube Once Hunsucker, Lesia Sago, MD       allopurinol (  ZYLOPRIM) tablet 100 mg  100 mg Per Tube BID Hunsucker, Lesia Sago, MD   100 mg at 08/21/22 1438   amiodarone (PACERONE) tablet 200 mg  200 mg Per Tube Daily Hunsucker, Lesia Sago, MD   200 mg at 08/21/22 1438   Chlorhexidine Gluconate Cloth 2 % PADS 6 each  6 each Topical Q0600 Bufford Buttner, MD   6 each at 08/21/22 0528   docusate (COLACE) 50 MG/5ML liquid 100 mg  100 mg Per Tube BID PRN Hunsucker, Lesia Sago, MD        insulin aspart (novoLOG) injection 0-6 Units  0-6 Units Subcutaneous Q4H Hunsucker, Lesia Sago, MD   1 Units at 08/12/22 2312   ipratropium-albuterol (DUONEB) 0.5-2.5 (3) MG/3ML nebulizer solution 3 mL  3 mL Nebulization Q6H PRN Hunsucker, Lesia Sago, MD   3 mL at 08/06/22 0618   lip balm (CARMEX) ointment 1 Application  1 Application Topical PRN Hunsucker, Lesia Sago, MD       melatonin tablet 5 mg  5 mg Per Tube QHS PRN Hunsucker, Lesia Sago, MD   5 mg at 08/20/22 2204   multivitamin (RENA-VIT) tablet 1 tablet  1 tablet Oral QHS Leroy Sea, MD   1 tablet at 08/20/22 2204   ondansetron (ZOFRAN) tablet 4 mg  4 mg Per Tube Q6H PRN Hunsucker, Lesia Sago, MD       Or   ondansetron [facility]) injection 4 mg  4 mg Intravenous Q6H PRN Hunsucker, Lesia Sago, MD   4 mg at 08/16/22 1249   Oral care mouth rinse  15 mL Mouth Rinse 4 times per day Karren Burly, MD   15 mL at 08/21/22 0943   oxyCODONE (Oxy IR/ROXICODONE) immediate release tablet 5 mg  5 mg Per Tube Q4H PRN Hunsucker, Lesia Sago, MD   5 mg at 08/16/22 0321   pantoprazole (PROTONIX) EC tablet 40 mg  40 mg Oral BID AC Pyrtle, Carie Caddy, MD       phenol (CHLORASEPTIC) mouth spray 1 spray  1 spray Mouth/Throat PRN Hunsucker, Lesia Sago, MD   1 spray at 08/17/22 1738   polyethylene glycol (MIRALAX / GLYCOLAX) packet 17 g  17 g Per Tube Daily PRN Hunsucker, Lesia Sago, MD   17 g at 08/19/22 2057   polyvinyl alcohol (LIQUIFILM TEARS) 1.4 % ophthalmic solution 1 drop  1 drop Both Eyes PRN Leroy Sea, MD       senna (SENOKOT) tablet 17.2 mg  2 tablet Per NG tube Daily PRN Hunsucker, Lesia Sago, MD       sodium chloride flush (NS) 0.9 % injection 10-40 mL  10-40 mL Intracatheter PRN Hunsucker, Lesia Sago, MD   20 mL at 08/15/22 0830   Tenofovir Alafenamide Fumarate TABS 25 mg  25 mg Oral Daily Danelle Earthly, MD   25 mg at 08/21/22 1439   traZODone (DESYREL) tablet 50 mg  50 mg Per Tube QHS PRN Hunsucker, Lesia Sago, MD   50 mg at 08/20/22 2204      Discharge Medications: Please see discharge summary for a list of discharge medications.  Relevant Imaging Results:  Relevant Lab Results:   Additional Information SSN: [id number]. Will be doing outpatient chemo treatment at [facility].  Mearl Latin, LCSW

## 2022-08-21 NOTE — Progress Notes (Signed)
Patient to endo via bed.

## 2022-08-21 NOTE — Progress Notes (Signed)
[facility] Progress Note  Subjective: UOP 1325 yest and 1825 so far today. Creat up at 3.9 form 3.4 yesterday. Pt is alert and interacting normally.   Vitals:   08/21/22 0500 08/21/22 0534 08/21/22 0800 08/21/22 1222  BP:  119/77  131/84  Pulse:  81 89 81  Resp:  18  (!) 22  Temp:  97.9 F (36.6 C) 98.6 F (37 C) (!) 97.1 F (36.2 C)  TempSrc:  Oral Oral Temporal  SpO2:    100%  Weight: 84.6 kg     Height:        Exam:   alert, nad   no jvd  Chest cta bilat  Cor reg no RG  Abd soft ntnd no ascites   Ext no LE edema   Alert, NF, ox3   RIJ temp HD cath      Date   Creat  eGFR  notes    July 2-17, 2021 0.87- 1.17 > 60 ml/min Isolated creat of 1.52 on 7/07 Dec 2021  0.69- 1.16 > 60 ml/min     6/03- 08, 2024 0.70- 1.01 > 33ml/min This admit    6/10- 24, 2024 1.36- 4.90   This admit         UA 6/3 - negative     UA 6/6 - negative     UA 6/11 - small bili, mod Hb, ket 5, prot 100, few bact, 6-10 rbc, 21-50 wbc, 0-5 epi      Assessment/ Plan: AKI - b/l creatinine 0.6- 0.9 here on admission. Creat increased up to 1.3 on 6/10 and 2.3 on 6/11.  UA showed ^wbc, min rbc's/ protein. Renal US showed no obstruction. Pt did had IV contrast exposure and some hypotension, but primarily AKI was felt to be more likely due to tumor lysis syndrome. Uric acid/ hyperkalemia peaked and then resolved w/ CRRT (6/13) and rasburicase. CRRT continued until 6/20 and pt then transitioned to [facility], which he had on 6/22 and 6/24 at [facility]. UOP then increased significantly on 6/22. We started IVF"s and UOP remains very good. Creat 3.9 today. Will continue to hold dialysis while seeing if kidneys are in recovery phase.  Tumor lysis syndrome - sp rasburicase 6mg  on 6/12 and another 6mg  on 6/13.  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 Ritux given 6/19. Lumps in the neck and L axilla have improved significantly.  Pancytopenia - leukopenia and plts  resolved, Hb remains low in 7- 8 range.  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 Hb was low prior to AKI and Hb remains in the 6- 8 range, getting prbc's prn.    Vinson Moselle MD  [facility] 08/21/2022, 12:45 PM  Recent Labs  Lab 08/16/22 0423 08/17/22 0155 08/17/22 0805 08/18/22 0436 08/20/22 0302 08/20/22 1429 08/21/22 0334  HGB 7.6*   < >  --    < > 7.6* 6.4* 7.8*  ALBUMIN 1.8*  --  1.7*  --  1.7*  --   --   CALCIUM 7.4*  --  7.6*   < > 7.2*  --  7.1*  PHOS 3.0  --  3.9  --   --   --   --   CREATININE 2.59*  --  4.17*   < > 3.38*  --  3.96*  K 5.0  --  5.8*   < > 4.0  --  4.2   < > =  values in this interval not displayed.    No results for input(s): "IRON", "TIBC", "FERRITIN" in the last 168 hours. Inpatient medications:  [MAR Hold] acetaminophen  650 mg Per Tube Once   [MAR Hold] allopurinol  100 mg Per Tube BID   [MAR Hold] amiodarone  200 mg Per Tube Daily   [MAR Hold] Chlorhexidine Gluconate Cloth  6 each Topical Q0600   [MAR Hold] insulin aspart  0-6 Units Subcutaneous Q4H   [MAR Hold] multivitamin  1 tablet Oral QHS   [MAR Hold] mouth rinse  15 mL Mouth Rinse 4 times per day   [MAR Hold] pantoprazole (PROTONIX) IV  40 mg Intravenous Q12H   [MAR Hold] Tenofovir Alafenamide Fumarate  25 mg Oral Daily    sodium chloride Stopped (08/13/22 0021)   sodium chloride 65 mL/hr at 08/21/22 0056   [MAR Hold] docusate, [MAR Hold] ipratropium-albuterol, [MAR Hold] lip balm, [MAR Hold] melatonin, [MAR Hold] ondansetron **OR** [MAR Hold] ondansetron (ZOFRAN) IV, [MAR Hold] oxyCODONE, [MAR Hold] phenol, [MAR Hold] polyethylene glycol, [MAR Hold] polyvinyl alcohol, [MAR Hold] senna, [MAR Hold] sodium chloride flush, [MAR Hold] traZODone

## 2022-08-21 NOTE — Transfer of Care (Signed)
Immediate Anesthesia Transfer of Care Note  Patient: [name]  Procedure(s) Performed: ESOPHAGOGASTRODUODENOSCOPY (EGD) WITH PROPOFOL  Patient Location: PACU  Anesthesia Type:MAC  Level of Consciousness: awake, alert , and oriented  Airway & Oxygen Therapy: Patient Spontanous Breathing  Post-op Assessment: Report given to RN and Post -op Vital signs reviewed and stable  Post vital signs: Reviewed and stable  Last Vitals:  Vitals Value Taken Time  BP    Temp    Pulse 88 08/21/22 1337  Resp 19 08/21/22 1337  SpO2 99 % 08/21/22 1337  Vitals shown include unvalidated device data.  Last Pain:  Vitals:   08/21/22 1222  TempSrc: Temporal  PainSc:       Patients Stated Pain Goal: 0 (08/12/22 2200)  Complications: No notable events documented.

## 2022-08-21 NOTE — Anesthesia Procedure Notes (Signed)
Procedure Name: MAC Date/Time: 08/21/2022 1:21 PM  Performed by: Marena Chancy, CRNAPre-anesthesia Checklist: Patient identified, Emergency Drugs available, Suction available, Patient being monitored and Timeout performed Patient Re-evaluated:Patient Re-evaluated prior to induction Oxygen Delivery Method: Simple face mask

## 2022-08-21 NOTE — Progress Notes (Signed)
Inpatient Rehabilitation Admissions Coordinator   Discussed with Dr Natale Lay and he consulted and received update from Dr Candise Che. Likely patient will not be able to tolerate the intensity required of a CIR admit with additional Chemo planned for next week on 7/2 per Dr Candise Che. We will follow at a distance to see his tolerance and response to further treatment, but likely will need SNF rehab for a slower paced program.  Ottie Glazier, RN, MSN Rehab Admissions Coordinator [phone] 08/21/2022 2:50 PM

## 2022-08-21 NOTE — Interval H&P Note (Signed)
History and Physical Interval Note: For EGD today to eval acute on chronic anemia with heme positive stool Patient with B-cell lymphoma and pancytopenia HIGHER THAN BASELINE RISK.The nature of the procedure, as well as the risks, benefits, and alternatives were carefully and thoroughly reviewed with the patient. Ample time for discussion and questions allowed. The patient understood, was satisfied, and agreed to proceed.      Latest Ref Rng & Units 08/21/2022    3:34 AM 08/20/2022    2:29 PM 08/20/2022    3:02 AM  CBC  WBC 4.0 - 10.5 K/uL 12.1  13.6  12.3   Hemoglobin 13.0 - 17.0 g/dL 7.8  6.4  7.6   Hematocrit 39.0 - 52.0 % 23.5  19.9  23.5   Platelets 150 - 400 K/uL 152  171  154    CMP     Component Value Date/Time   NA 132 (L) 08/21/2022 0334   K 4.2 08/21/2022 0334   CL 101 08/21/2022 0334   CO2 21 (L) 08/21/2022 0334   GLUCOSE 99 08/21/2022 0334   BUN 55 (H) 08/21/2022 0334   CREATININE 3.96 (H) 08/21/2022 0334   CREATININE 0.80 07/19/2022 1432   CALCIUM 7.1 (L) 08/21/2022 0334   PROT 3.9 (L) 08/20/2022 0302   ALBUMIN 1.7 (L) 08/20/2022 0302   AST 25 08/20/2022 0302   AST 31 07/19/2022 1432   ALT 18 08/20/2022 0302   ALT 35 07/19/2022 1432   ALKPHOS 111 08/20/2022 0302   BILITOT 0.8 08/20/2022 0302   BILITOT 1.2 07/19/2022 1432   GFRNONAA 16 (L) 08/21/2022 0334   GFRNONAA >60 07/19/2022 1432    08/21/2022 12:51 PM  Lolita Cram  has presented today for surgery, with the diagnosis of melena, anemia.  The various methods of treatment have been discussed with the patient and family. After consideration of risks, benefits and other options for treatment, the patient has consented to  Procedure(s): ESOPHAGOGASTRODUODENOSCOPY (EGD) WITH PROPOFOL (N/A) as a surgical intervention.  The patient's history has been reviewed, patient examined, no change in status, stable for surgery.  I have reviewed the patient's chart and labs.  Questions were answered to the patient's  satisfaction.     Carie Caddy Nevelyn Mellott

## 2022-08-21 NOTE — Progress Notes (Signed)
Patient back from endo. Patient is a/o x4. Family at bedside.

## 2022-08-21 NOTE — Anesthesia Postprocedure Evaluation (Signed)
Anesthesia Post Note  Patient: Tyler [name]  Procedure(s) Performed: ESOPHAGOGASTRODUODENOSCOPY (EGD) WITH PROPOFOL BIOPSY     Patient location during evaluation: PACU Anesthesia Type: MAC Level of consciousness: awake and alert Pain management: pain level controlled Vital Signs Assessment: post-procedure vital signs reviewed and stable Respiratory status: spontaneous breathing, nonlabored ventilation and respiratory function stable Cardiovascular status: blood pressure returned to baseline Postop Assessment: no apparent nausea or vomiting Anesthetic complications: no   No notable events documented.  Last Vitals:  Vitals:   08/21/22 1400 08/21/22 1410  BP: 124/77   Pulse: 81 81  Resp: 20 20  Temp:  36.7 C  SpO2: 97% 96%    Last Pain:  Vitals:   08/21/22 1410  TempSrc:   PainSc: 0-No pain                 Shanda Howells

## 2022-08-21 NOTE — Consult Note (Addendum)
[facility]    Date of Admission:  07/29/2022           Reason for Consult: HBV    Principal Problem:   Sepsis (HCC) Active Problems:   Prostate cancer (HCC)   Hypertension   Paroxysmal atrial fibrillation (HCC)   Normocytic anemia   Hyponatremia   Diffuse large B-cell lymphoma of lymph nodes of multiple regions (HCC)   Anemia   At high risk of tumor lysis syndrome   Spontaneous tumor lysis syndrome   Acute renal failure with tubular necrosis (HCC)   Aspiration into airway   Tumor lysis syndrome   Chemotherapy-induced neutropenia (HCC)   Heme positive stool   Melena   Acute gastric ulcer   Assessment: 72 year old male with low-grade myelodysplastic syndrome found to have generalized lymphadenopathy from high-grade large B-cell lymphoma started on R-CHOP on 6/10.  ID engaged due to her antibody positive.  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  He is B surface antibody positive, surface antigen negative and viral load nondetectable.  I spoke to patient and his daughter and they state that he has never had treatment for hepatitis B.  He immigrated from [place] years ago.  Suspect natural immunity given surface antibody and core antibody are positive.  Regardless would have to start tenofovir and do monthly labs. Recommendations:  -Start tenofovir while on Rituxumab. Patient is at moderate risk given HBsAg-, HBcAB+  and receiving anti-CD20(rituximab).  Recommendation for moderate risk patient  is treatment with antiviral due to risk of reacitvation.  Treatment is given concurrently while on immunosuppressive therapy. -ID will sign off Microbiology:     HPI: Tyler [name] is a 72 y.o. male myelodysplastic syndrome, follicular lymphoma recently diagnosed in March of this year, history of prostate cancer, essential hypertension,  paroxysmal A-fib, history of PE, history of recurrent UTIs and previous pelvic hematoma admitted with abdominal pain as well as near syncope.  Started on antibiotics due to fever and hypotension.  Noted to have seizure-like activity and right facial prosthesis.  Developed renal failure.  Had PICC placed started on CHOP on 07/27/2022.  Renal function worsened and nephrology consulted.  Had elevated uric acid concern for tumor lysis syndrome initiated.  Case.  Repeat Rituxan was held due to this.  He was started on CRRT and subsequently switched to hemodialysis.  ID engaged due to patient being on Rituxan and hepatitis C core antibody being positive.   Review of Systems: Review of Systems  All other systems reviewed and are negative.   Past Medical History:  Diagnosis Date   Cancer [facility])    prostate   Follicular lymphoma (HCC) 2024   History of pulmonary embolism    Hypertension    Myelodysplastic syndrome (HCC)    PAF (paroxysmal atrial fibrillation) (HCC)    Recurrent UTI     Social History   Tobacco Use   Smoking status: Never   Smokeless tobacco: Never  Vaping Use   Vaping Use: Never used  Substance Use Topics   Alcohol use: Yes    Comment: at least one 40 oz beer daily   Drug use: Never    Family History  Problem Relation Age of Onset   Stroke Father    Scheduled Meds:  acetaminophen  650 mg Per Tube Once   allopurinol  100 mg  Per Tube BID   amiodarone  200 mg Per Tube Daily   Chlorhexidine Gluconate Cloth  6 each Topical Q0600   insulin aspart  0-6 Units Subcutaneous Q4H   multivitamin  1 tablet Oral QHS   mouth rinse  15 mL Mouth Rinse 4 times per day   pantoprazole  40 mg Oral BID AC   Tenofovir Alafenamide Fumarate  25 mg Oral Daily   Continuous Infusions:  sodium chloride Stopped (08/13/22 0021)   sodium chloride 65 mL/hr at 08/21/22 1313   PRN Meds:.docusate, ipratropium-albuterol, lip balm, melatonin, ondansetron **OR** ondansetron (ZOFRAN) IV, oxyCODONE,  phenol, polyethylene glycol, polyvinyl alcohol, senna, sodium chloride flush, traZODone Allergies  Allergen Reactions   Amitriptyline Other (See Comments)    Nightmares, Night sweats, Migraines    Amlodipine Swelling    BILATERAL FEET TO ANKLES   Neurontin [Gabapentin] Other (See Comments)    Nightmares; Night sweats; Headache    OBJECTIVE: Blood pressure 124/77, pulse 81, temperature 98 F (36.7 C), resp. rate 20, height 5\' 7"  (1.702 m), weight 84.6 kg, SpO2 96 %.  Physical Exam Constitutional:      General: He is not in acute distress.    Appearance: He is normal weight. He is not toxic-appearing.  HENT:     Head: Normocephalic and atraumatic.     Right Ear: External ear normal.     Left Ear: External ear normal.     Nose: No congestion or rhinorrhea.     Mouth/Throat:     Mouth: Mucous membranes are moist.     Pharynx: Oropharynx is clear.  Eyes:     Extraocular Movements: Extraocular movements intact.     Conjunctiva/sclera: Conjunctivae normal.     Pupils: Pupils are equal, round, and reactive to light.  Cardiovascular:     Rate and Rhythm: Normal rate and regular rhythm.     Heart sounds: No murmur heard.    No friction rub. No gallop.  Pulmonary:     Effort: Pulmonary effort is normal.     Breath sounds: Normal breath sounds.  Abdominal:     General: Abdomen is flat. Bowel sounds are normal.     Palpations: Abdomen is soft.  Musculoskeletal:        General: No swelling. Normal range of motion.     Cervical back: Normal range of motion and neck supple.  Skin:    General: Skin is warm and dry.  Neurological:     General: No focal deficit present.     Mental Status: He is oriented to person, place, and time.  Psychiatric:        Mood and Affect: Mood normal.     Lab Results Lab Results  Component Value Date   WBC 12.1 (H) 08/21/2022   HGB 7.8 (L) 08/21/2022   HCT 23.5 (L) 08/21/2022   MCV 87.0 08/21/2022   PLT 152 08/21/2022    Lab Results   Component Value Date   CREATININE 3.96 (H) 08/21/2022   BUN 55 (H) 08/21/2022   NA 132 (L) 08/21/2022   K 4.2 08/21/2022   CL 101 08/21/2022   CO2 21 (L) 08/21/2022    Lab Results  Component Value Date   ALT 18 08/20/2022   AST 25 08/20/2022   ALKPHOS 111 08/20/2022   BILITOT 0.8 08/20/2022       Danelle Earthly, MD [facility] 08/21/2022, 2:30 PM   I have personally spent 82 minutes involved in  face-to-face and non-face-to-face activities for this patient on the day of the visit. Professional time spent includes the following activities: Preparing to see the patient (review of tests), Obtaining and/or reviewing separately obtained history (admission/discharge record), Performing a medically appropriate examination and/or evaluation , Ordering medications/tests/procedures, referring and communicating with other health care professionals, Documenting clinical information in the EMR, Independently interpreting results (not separately reported), Communicating results to the patient/family/caregiver, Counseling and educating the patient/family/caregiver and Care coordination (not separately reported).

## 2022-08-21 NOTE — Anesthesia Preprocedure Evaluation (Addendum)
Anesthesia Evaluation  Patient identified by MRN, date of birth, ID band Patient awake    Reviewed: Allergy & Precautions, NPO status , Patient's Chart, lab work & pertinent test results  History of Anesthesia Complications Negative for: history of anesthetic complications  Airway Mallampati: III  TM Distance: >3 FB Neck ROM: Full    Dental no notable dental hx.    Pulmonary neg pulmonary ROS   Pulmonary exam normal        Cardiovascular hypertension, Pt. on medications Normal cardiovascular exam+ dysrhythmias Atrial Fibrillation      Neuro/Psych negative neurological ROS     GI/Hepatic Neg liver ROS,,,GIB   Endo/Other  negative endocrine ROS    Renal/GU ARFRenal disease     Musculoskeletal negative musculoskeletal ROS (+)    Abdominal   Peds  Hematology  (+) Blood dyscrasia (Hgb 7.8), anemia Follicular lymphoma   Anesthesia Other Findings Day of surgery medications reviewed with patient.  Reproductive/Obstetrics negative OB ROS                              Anesthesia Physical Anesthesia Plan  ASA: 3  Anesthesia Plan: MAC   Post-op Pain Management: Minimal or no pain anticipated   Induction:   PONV Risk Score and Plan: 1 and Treatment may vary due to age or medical condition and Propofol infusion  Airway Management Planned: Natural Airway and Nasal Cannula  Additional Equipment: None  Intra-op Plan:   Post-operative Plan:   Informed Consent: I have reviewed the patients History and Physical, chart, labs and discussed the procedure including the risks, benefits and alternatives for the proposed anesthesia with the patient or authorized representative who has indicated his/her understanding and acceptance.       Plan Discussed with: CRNA  Anesthesia Plan Comments:         Anesthesia Quick Evaluation

## 2022-08-21 NOTE — Progress Notes (Addendum)
Progress Note    Tyler [name]  JOA:[id number] DOB: 05/27/1950  DOA: 07/29/2022 PCP: Nechama Guard, FNP      Brief Narrative:    Medical records reviewed and are as summarized below:  Dayvin Aber is a 72 y.o. male 72 yo male with history of PE, hypertension, paroxysmal atrial fibrillation, recurrent UTIs, low grade myelodysplastic syndrome found to have generalized lymphadenopathy from high grade large B cell lymphoma. Plan was to start on R-CHOP. Came to ER with near syncope. He had fever and hypotension with elevated lactic acid level. Started on ABx.   He had seizure like activity, and Rt facial paresthesia. He developed renal failure. Seen by cardiology for PAF. Had PICC placed and started on R mini CHOP on 08/05/22. Renal fx got worse and nephrology consulted. Had elevated uric acid with concern for tumor lysis syndrome and received rasburicase. Rituxan held because of this. Renal fx and mental status got worse. Concern for aspiration pneumonitis.  He was seen by the ICU team and kept in ICU, also seen by nephrology, oncology along with cardiology.  He was eventually started on CRRT and subsequently switched to hemodialysis.  He was transferred to the hospitalist team on 08/17/2022 and was transferred from [facility] to [facility] Events: I   6/03 Presented to ED for fever, near-syncope. Presumed sepsis. Admitted to [facility]. 6/13 Worsening acute renal failure as evidenced by rising BUN/Cr, hyperkalemia to 5.6. PCCM consulted for ARF/need for RRT, line placement. Intubated. R IJ Trialysis catheter placed. CRRT started. 6/15 transfuse 1 unit PRBC 6/16 extubate 6/17 no acute events overnight, remains on CRRT 6/18 no acute events overnight, remains on CRRT 6/19 Rituximab, CRRT 08/17/2022 started on HD 08/17/2022 transferred to hospitalist service     Assessment/Plan:   Principal Problem:   Sepsis (HCC) Active Problems:   Prostate cancer  (HCC)   Hypertension   Paroxysmal atrial fibrillation (HCC)   Normocytic anemia   Hyponatremia   Diffuse large B-cell lymphoma of lymph nodes of multiple regions (HCC)   Anemia   At high risk of tumor lysis syndrome   Spontaneous tumor lysis syndrome   Acute renal failure with tubular necrosis (HCC)   Aspiration into airway   Tumor lysis syndrome   Chemotherapy-induced neutropenia (HCC)   Heme positive stool   Melena   Acute gastric ulcer   Nutrition Problem: Increased nutrient needs Etiology: chronic illness (lymphoma)  Signs/Symptoms: estimated needs   Body mass index is 29.21 kg/m.    Aspiration pneumonia, sepsis, acute hypoxic respiratory failure with compromised airway.  Initially intubated kept in ICU, extubated on 08/11/2022, has finished antibiotics on 08/13/2022.  NG tube DC'd on 08/18/22, speech following.  Continue to monitor closely. He is tolerating room air  Sepsis with hypotension.  Required antibiotics and pressor support in ICU, now resolved.  Off of antibiotics and pressors.  Monitor.     Acute renal failure: Suspect multifactorial related to possible sepsis as well as contribution of tumor lysis syndrome given elevated uric acid etc. s/p rasburicase x 2, seen by nephrology, has right IJ HD catheter placed, initially underwent CRRT treatment now started on HD treatment on 08/17/2022.  Nephrology on board, still hoping for renal recovery Urine output is still reasonable.   Paroxysmal A-fib: No AC due to history of bleeding - seen by cardiology, currently on amiodarone for rate control.  Not on anticoagulation because of high bleeding risk.  High-grade Follicular B cell  lymphoma.  Being followed by hematology, s/p mini CHOP treatment on 08/06/2022, complicated by severe pancytopenia, tumor lysis syndrome with AKI. Also received Rituxan this admission.  Defer further management to oncology service.   Pancytopenia:  -- Transfused 1 unit PRBC on 08/17/22 & another on  08/19/22.  S/p transfusion with 2 units of PRBCs on 08/20/2022 for hemoglobin of 6.8.  Had about 12 units of PRBCs and 2 units of platelets thus far on this admission. Leukopenia and thrombocytopenia have resolved. Granix (stopped 08/18/22).  Defer further management to hematology and oncology team.   Tumor lysis syndrome: Continue allopurinol and now also getting HD for renal failure.   Metabolic encephalopathy.  Improving.  Continue to monitor.   Generalized weakness and dysphagia.  Was on NG tube feeds, speech evaluation  >> D3 diet, discontinued core track tube 08/18/22 as patient complaining of discomfort.   Past history of hep B.  Now on immunosuppressive including Rituxan for lymphoma, ID consulted for any needed suppressive treatment. Hep B surface antigen nonreactive, hep B surface antibody 13.6 suggestive of immunity.  Melena, acute GI bleeding on the afternoon of 08/20/2022: S/p EGD on 08/21/2022 showed normal esophagus, nonbleeding gastric ulcer with no stigmata of bleeding and normal duodenum. Continue Protonix.      Diet Order             Diet heart healthy/carb modified Room service appropriate? Yes; Fluid consistency: Thin  Diet effective now                            Consultants: Nephrologist Oncologist Cardiologist Intensivist  Procedures: EGD 08/21/2022 Extubation 08/11/2022 Intubation 08/08/2022 Hemodialysis catheter right IJ 08/08/2022    Medications:    [MAR Hold] acetaminophen  650 mg Per Tube Once   [MAR Hold] allopurinol  100 mg Per Tube BID   [MAR Hold] amiodarone  200 mg Per Tube Daily   [MAR Hold] Chlorhexidine Gluconate Cloth  6 each Topical Q0600   [MAR Hold] insulin aspart  0-6 Units Subcutaneous Q4H   [MAR Hold] multivitamin  1 tablet Oral QHS   [MAR Hold] mouth rinse  15 mL Mouth Rinse 4 times per day   pantoprazole  40 mg Oral BID AC   [MAR Hold] Tenofovir Alafenamide Fumarate  25 mg Oral Daily   Continuous Infusions:   sodium chloride Stopped (08/13/22 0021)   sodium chloride 65 mL/hr at 08/21/22 1313     Anti-infectives (From admission, onward)    Start     Dose/Rate Route Frequency Ordered Stop   08/21/22 1045  [MAR Hold]  Tenofovir Alafenamide Fumarate TABS 25 mg        (MAR Hold since Wed 08/21/2022 at 1220.Hold Reason: Transfer to a Procedural area)   25 mg Oral Daily 08/21/22 0947     08/08/22 1500  piperacillin-tazobactam (ZOSYN) IVPB 3.375 g        3.375 g 100 mL/hr over 30 Minutes Intravenous Every 6 hours 08/08/22 1321 08/13/22 0934   07/30/22 1600  vancomycin (VANCOREADY) IVPB 1750 mg/350 mL  Status:  Discontinued        1,750 mg 175 mL/hr over 120 Minutes Intravenous Every 24 hours 07/29/22 2108 08/01/22 1135   07/30/22 0000  ceFEPIme (MAXIPIME) 2 g in sodium chloride 0.9 % 100 mL IVPB        2 g 200 mL/hr over 30 Minutes Intravenous Every 8 hours 07/29/22 2108 08/05/22 1833   07/29/22 1530  ceFEPIme (  MAXIPIME) 2 g in sodium chloride 0.9 % 100 mL IVPB        2 g 200 mL/hr over 30 Minutes Intravenous  Once 07/29/22 1517 07/29/22 1628   07/29/22 1530  metroNIDAZOLE (FLAGYL) IVPB 500 mg        500 mg 100 mL/hr over 60 Minutes Intravenous  Once 07/29/22 1517 07/29/22 1701   07/29/22 1530  vancomycin (VANCOCIN) IVPB 1000 mg/200 mL premix  Status:  Discontinued        1,000 mg 200 mL/hr over 60 Minutes Intravenous  Once 07/29/22 1517 07/29/22 1520   07/29/22 1530  vancomycin (VANCOREADY) IVPB 1750 mg/350 mL        1,750 mg 175 mL/hr over 120 Minutes Intravenous  Once 07/29/22 1520 07/29/22 1908              Family Communication/Anticipated D/C date and plan/Code Status   DVT prophylaxis: Place and maintain sequential compression device Start: 08/07/22 0936 SCDs Start: 07/30/22 0108     Code Status: Full Code  Family Communication: None Disposition Plan: Plan to discharge to acute inpatient rehab when medically stable   Status is: Inpatient Remains inpatient appropriate  because: On hemodialysis for AKI, GI bleeding       Subjective:   Interval events noted.  No melena today.  No abdominal pain, vomiting, shortness of breath or dizziness  Objective:    Vitals:   08/21/22 0800 08/21/22 1222 08/21/22 1338 08/21/22 1345  BP:  131/84 110/74 119/76  Pulse: 89 81 88 82  Resp:  (!) 22 19 20   Temp: 98.6 F (37 C) (!) 97.1 F (36.2 C) 98.3 F (36.8 C)   TempSrc: Oral Temporal    SpO2:  100% 99% 98%  Weight:      Height:       No data found.   Intake/Output Summary (Last 24 hours) at 08/21/2022 1400 Last data filed at 08/21/2022 1334 Gross per 24 hour  Intake 2336.39 ml  Output 2250 ml  Net 86.39 ml   Filed Weights   08/19/22 1200 08/20/22 0300 08/21/22 0500  Weight: 79.9 kg 83.9 kg 84.6 kg    Exam:  GEN: NAD SKIN: Warm and dry EYES: No pallor or icterus ENT: MMM CV: RRR PULM: CTA B ABD: soft, ND, NT, +BS CNS: AAO x 3, non focal EXT: No edema or tenderness        Data Reviewed:   I have personally reviewed following labs and imaging studies:  Labs: Labs show the following:   Basic Metabolic Panel: Recent Labs  Lab 08/14/22 1714 08/14/22 1714 08/15/22 0504 08/15/22 1528 08/15/22 2244 08/16/22 0423 08/17/22 0805 08/18/22 0436 08/19/22 0332 08/20/22 0302 08/21/22 0334  NA 135  --  135 135   < > 133* 133* 133* 132* 137 132*  K 4.3  --  4.3 4.4   < > 5.0 5.8* 5.1 5.4* 4.0 4.2  CL 105  --  105 106   < > 101 101 97* 95* 102 101  CO2 25  --  25 26   < > 25 23 27 25 24  21*  GLUCOSE 155*  --  163* 144*   < > 117* 108* 113* 88 78 99  BUN 34*  --  34* 37*   < > 60* 88* 66* 81* 45* 55*  CREATININE 1.60*  --  1.55* 1.65*   < > 2.59* 4.17* 3.68* 4.90* 3.38* 3.96*  CALCIUM 7.4*  --  7.2* 7.2*   < >  7.4* 7.6* 7.7* 8.1* 7.2* 7.1*  MG  --    < > 2.3  --   --  2.4  --  1.9 2.1 1.6* 1.5*  PHOS 1.5*  --  3.0 2.3*  --  3.0 3.9  --   --   --   --    < > = values in this interval not displayed.   GFR Estimated Creatinine  Clearance: 18 mL/min (A) (by C-G formula based on SCr of 3.96 mg/dL (H)). Liver Function Tests: Recent Labs  Lab 08/15/22 0504 08/15/22 1528 08/16/22 0423 08/17/22 0805 08/20/22 0302  AST  --   --   --   --  25  ALT  --   --   --   --  18  ALKPHOS  --   --   --   --  111  BILITOT  --   --   --   --  0.8  PROT  --   --   --   --  3.9*  ALBUMIN 1.8* 1.8* 1.8* 1.7* 1.7*   No results for input(s): "LIPASE", "AMYLASE" in the last 168 hours. No results for input(s): "AMMONIA" in the last 168 hours. Coagulation profile No results for input(s): "INR", "PROTIME" in the last 168 hours.  CBC: Recent Labs  Lab 08/17/22 0155 08/18/22 0436 08/19/22 0332 08/20/22 0302 08/20/22 1429 08/21/22 0334  WBC 2.2* 5.4 14.3* 12.3* 13.6* 12.1*  NEUTROABS 1.2* 4.4 13.4* 10.3*  --  7.7  HGB 7.2* 7.5* 7.0* 7.6* 6.4* 7.8*  HCT 22.7* 22.1* 21.4* 23.5* 19.9* 23.5*  MCV 85.0 85.7 86.6 86.7 89.6 87.0  PLT 78* 104* 131* 154 171 152   Cardiac Enzymes: No results for input(s): "CKTOTAL", "CKMB", "CKMBINDEX", "TROPONINI" in the last 168 hours. BNP (last 3 results) No results for input(s): "PROBNP" in the last 8760 hours. CBG: Recent Labs  Lab 08/20/22 1539 08/20/22 1954 08/20/22 2336 08/21/22 0306 08/21/22 0818  GLUCAP 88 101* 95 112* 90   D-Dimer: No results for input(s): "DDIMER" in the last 72 hours. Hgb A1c: No results for input(s): "HGBA1C" in the last 72 hours. Lipid Profile: No results for input(s): "CHOL", "HDL", "LDLCALC", "TRIG", "CHOLHDL", "LDLDIRECT" in the last 72 hours. Thyroid function studies: No results for input(s): "TSH", "T4TOTAL", "T3FREE", "THYROIDAB" in the last 72 hours.  Invalid input(s): "FREET3" Anemia work up: No results for input(s): "VITAMINB12", "FOLATE", "FERRITIN", "TIBC", "IRON", "RETICCTPCT" in the last 72 hours. Sepsis Labs: Recent Labs  Lab 08/19/22 0332 08/20/22 0302 08/20/22 1429 08/21/22 0334  WBC 14.3* 12.3* 13.6* 12.1*    Microbiology No  results found for this or any previous visit (from the past 240 hour(s)).  Procedures and diagnostic studies:  No results found.             LOS: 23 days   Marquin Patino  [employer] loss adjuster on [email or web address]. If 7PM-7AM, please contact night-coverage at [email or web address]     08/21/2022, 2:00 PM

## 2022-08-21 NOTE — Progress Notes (Signed)
SLP Cancellation Note  Patient Details Name: Tyler [name] MRN: [id number] DOB: 1950-04-02   Cancelled treatment:       Reason Eval/Treat Not Completed: Medical issues which prohibited therapy (pt now npo for EGD, will continue efforts)  [name], MS [facility] SLP Acute Rehab Services Office [phone]   Chales Abrahams 08/21/2022, 7:13 AM

## 2022-08-22 DIAGNOSIS — N170 Acute kidney failure with tubular necrosis: Secondary | ICD-10-CM | POA: Diagnosis not present

## 2022-08-22 DIAGNOSIS — K921 Melena: Secondary | ICD-10-CM | POA: Diagnosis not present

## 2022-08-22 LAB — BPAM RBC
Blood Product Expiration Date: 202407222359
Unit Type and Rh: 5100

## 2022-08-22 LAB — BASIC METABOLIC PANEL
Anion gap: 14 (ref 5–15)
BUN: 57 mg/dL — ABNORMAL HIGH (ref 8–23)
CO2: 21 mmol/L — ABNORMAL LOW (ref 22–32)
Calcium: 7.5 mg/dL — ABNORMAL LOW (ref 8.9–10.3)
Chloride: 102 mmol/L (ref 98–111)
Creatinine, Ser: 4.05 mg/dL — ABNORMAL HIGH (ref 0.61–1.24)
GFR, Estimated: 15 mL/min — ABNORMAL LOW (ref >60)
Glucose, Bld: 89 mg/dL (ref 70–99)
Potassium: 3.7 mmol/L (ref 3.5–5.1)
Sodium: 137 mmol/L (ref 135–145)

## 2022-08-22 LAB — CBC WITH DIFFERENTIAL/PLATELET
Abs Immature Granulocytes: 0.2 K/uL — ABNORMAL HIGH (ref 0.00–0.07)
Basophils Absolute: 0 K/uL (ref 0.0–0.1)
Basophils Relative: 0 %
Eosinophils Absolute: 0 K/uL (ref 0.0–0.5)
Eosinophils Relative: 0 %
HCT: 19.6 % — ABNORMAL LOW (ref 39.0–52.0)
Hemoglobin: 6.4 g/dL — CL (ref 13.0–17.0)
Lymphocytes Relative: 15 %
Lymphs Abs: 1.5 K/uL (ref 0.7–4.0)
MCH: 29.4 pg (ref 26.0–34.0)
MCHC: 32.7 g/dL (ref 30.0–36.0)
MCV: 89.9 fL (ref 80.0–100.0)
Metamyelocytes Relative: 2 %
Monocytes Absolute: 0.8 K/uL (ref 0.1–1.0)
Monocytes Relative: 8 %
Neutro Abs: 7.7 K/uL (ref 1.7–7.7)
Neutrophils Relative %: 75 %
Platelets: 166 K/uL (ref 150–400)
RBC: 2.18 MIL/uL — ABNORMAL LOW (ref 4.22–5.81)
RDW: 15.7 % — ABNORMAL HIGH (ref 11.5–15.5)
WBC: 10.3 K/uL (ref 4.0–10.5)
nRBC: 0 % (ref 0.0–0.2)

## 2022-08-22 LAB — HEMOGLOBIN AND HEMATOCRIT, BLOOD
HCT: 24.3 % — ABNORMAL LOW (ref 39.0–52.0)
Hemoglobin: 8 g/dL — ABNORMAL LOW (ref 13.0–17.0)

## 2022-08-22 LAB — GLUCOSE, CAPILLARY
Glucose-Capillary: 85 mg/dL (ref 70–99)
Glucose-Capillary: 90 mg/dL (ref 70–99)
Glucose-Capillary: 111 mg/dL — ABNORMAL HIGH (ref 70–99)
Glucose-Capillary: 74 mg/dL (ref 70–99)
Glucose-Capillary: 111 mg/dL — ABNORMAL HIGH (ref 70–99)

## 2022-08-22 LAB — PREPARE RBC (CROSSMATCH)

## 2022-08-22 LAB — TYPE AND SCREEN: Unit division: 0

## 2022-08-22 LAB — SURGICAL PATHOLOGY

## 2022-08-22 MED ORDER — AMIODARONE HCL 200 MG PO TABS
200.0000 mg | ORAL_TABLET | Freq: Every day | ORAL | Status: DC
  Administered 2022-08-22 – 2022-08-28 (×7): 200 mg via ORAL
  Filled 2022-08-22 (×7): qty 1

## 2022-08-22 MED ORDER — ALLOPURINOL 100 MG PO TABS
100.0000 mg | ORAL_TABLET | Freq: Two times a day (BID) | ORAL | Status: DC
  Administered 2022-08-22 – 2022-08-28 (×13): 100 mg via ORAL
  Filled 2022-08-22 (×13): qty 1

## 2022-08-22 MED ORDER — SENNA 8.6 MG PO TABS
2.0000 | ORAL_TABLET | Freq: Every day | ORAL | Status: DC | PRN
  Administered 2022-08-24: 17.2 mg via ORAL
  Filled 2022-08-22: qty 2

## 2022-08-22 MED ORDER — ONDANSETRON HCL 4 MG PO TABS: 4.0000 mg | ORAL_TABLET | Freq: Four times a day (QID) | ORAL | Status: DC | PRN

## 2022-08-22 MED ORDER — OXYCODONE HCL 5 MG PO TABS
5.0000 mg | ORAL_TABLET | ORAL | Status: DC | PRN
  Administered 2022-08-26: 5 mg via ORAL
  Filled 2022-08-22: qty 1

## 2022-08-22 MED ORDER — TRAZODONE HCL 50 MG PO TABS
50.0000 mg | ORAL_TABLET | Freq: Every evening | ORAL | Status: DC | PRN
  Administered 2022-08-22 – 2022-08-26 (×3): 50 mg via ORAL
  Filled 2022-08-22 (×3): qty 1

## 2022-08-22 MED ORDER — SODIUM CHLORIDE 0.9% IV SOLUTION: Freq: Once | INTRAVENOUS | Status: AC

## 2022-08-22 MED ORDER — SODIUM CHLORIDE 0.9% IV SOLUTION: Freq: Once | INTRAVENOUS | Status: DC

## 2022-08-22 MED ORDER — POLYETHYLENE GLYCOL 3350 17 G PO PACK: 17.0000 g | PACK | Freq: Every day | ORAL | Status: DC | PRN

## 2022-08-22 MED ORDER — DOCUSATE SODIUM 50 MG/5ML PO LIQD: 100.0000 mg | Freq: Two times a day (BID) | ORAL | Status: DC | PRN

## 2022-08-22 MED ORDER — ONDANSETRON HCL 4 MG/2ML IJ SOLN: 4.0000 mg | Freq: Four times a day (QID) | INTRAMUSCULAR | Status: DC | PRN

## 2022-08-22 MED ORDER — MELATONIN 5 MG PO TABS
5.0000 mg | ORAL_TABLET | Freq: Every evening | ORAL | Status: DC | PRN
  Administered 2022-08-22 – 2022-08-25 (×2): 5 mg via ORAL
  Filled 2022-08-22 (×3): qty 1

## 2022-08-22 NOTE — Progress Notes (Signed)
Occupational Therapy Treatment Patient Details Name: Tyler [name] MRN: [id number] DOB: Mar 17, 1950 Today's Date: 08/22/2022   History of present illness Pt is a 72 yo male who presented to [facility] ED on 6/3 with poor po intake, abdominal pain, near syncope, fever, hypotension, and elevated lactic acid level. Pt found to have sepsis, severe anemia, subacute L4 fx, and generalized lymphadenopathy from high grade large B cell lymphoma. While at [facility], pt had seizure like activity, Rt facial paresthesia, and developed renal failure. Seen by cardiology for PAF. PICC placed and started on R mini CHOP on 08/05/22. Worsening renal fx, concern for tumor lysis syndrome and received rasburicase. + Concern for aspiration pneumonitis. In ICU started on CRRT and subsequently switched to HD treatments and transferred to [facility] on 6/22/202. S/p Endoscopy 6/26 w/findings: Normal esophagus, non-bleeding gastric ulcer with no stigmata of bleeding. Biopsied. Normal examined duodenum. PMH: PE, hypertension, paroxysmal atrial fibrillation, and recurrent UTIs.   OT comments  Pt supine in bed with HOB elevated upon arrival. Pt agreeable to participation in session. Pt participated well throughout session, is motivated to increase independence with functional tasks, and is making good progress toward goals. OT educated pt in techniques for increased safety and independence with UB dressing, grooming, toileting transfers, and toileting hygiene/clothing management this session with pt demonstrating good understanding of training through teach back. Pt currently demonstrates ability to complete UB ADLs with Set up to Min assist in sitting, LB ADLs with Mod to Max assist +2 for safety/equipment/line management in sit/stand, and functional transfers/mobility for short distances with Min guard assist +2 for safety/equipment/line management. Pt will benefit from continues acute skilled OT services to address deficits, decrease caregiver burden, and  increase safety and independence with ADLs and functional transfers/mobility. Discharge plan remains appropriate.    Recommendations for follow up therapy are one component of a multi-disciplinary discharge planning process, led by the attending physician.  Recommendations may be updated based on patient status, additional functional criteria and insurance authorization.    Assistance Recommended at Discharge Frequent or constant Supervision/Assistance  Patient can return home with the following  A little help with walking and/or transfers;A lot of help with bathing/dressing/bathroom;Assistance with cooking/housework;Assist for transportation;Help with stairs or ramp for entrance   Equipment Recommendations  Other (comment) (Defer to next level of care)    Recommendations for Other Services Rehab consult    Precautions / Restrictions Precautions Precautions: Fall Precaution Comments: L4 compression fracture, Protective Precs (chemo on 7/2) Restrictions Weight Bearing Restrictions: No       Mobility Bed Mobility Overal bed mobility: Needs Assistance Bed Mobility: Supine to Sit     Supine to sit: Min guard, HOB elevated     General bed mobility comments: Good initiation of BLE, needs increased time due to c/o BLE pain, pt using bed rail to assist with movements    Transfers Overall transfer level: Needs assistance Equipment used: Rolling walker (2 wheels) Transfers: Sit to/from Stand, Bed to chair/wheelchair/BSC Sit to Stand: Min guard, +2 safety/equipment, From elevated surface     Step pivot transfers: Min guard, +2 safety/equipment     General transfer comment: Pt needs initial cues not to pull up on RW (and visual demo), after second trial, pt able to recall safe placement with slight delay.     Balance Overall balance assessment: Needs assistance Sitting-balance support: Single extremity supported, Bilateral upper extremity supported, No upper extremity supported,  Feet supported Sitting balance-Leahy Scale: Fair   Postural control: Posterior lean (with movement  of Bilateral LE in sitting) Standing balance support: Bilateral upper extremity supported, During functional activity, Reliant on assistive device for balance Standing balance-Leahy Scale: Fair                             ADL either performed or assessed with clinical judgement   ADL Overall ADL's : Needs assistance/impaired     Grooming: Set up;Sitting   Upper Body Bathing: Min guard;Sitting       Upper Body Dressing : Minimal assistance;Sitting (Min assist for line management. Otherwise Set up with increased time.)   Lower Body Dressing: Moderate assistance;Sitting/lateral leans   Toilet Transfer: Min guard;+2 for safety/equipment;Ambulation;BSC/3in1;Rolling walker (2 wheels)   Toileting- Clothing Manipulation and Hygiene: Maximal assistance;+2 for safety/equipment;Sit to/from stand       Functional mobility during ADLs: Min guard;+2 for safety/equipment;Rolling walker (2 wheels) (For short distances of approx. 8 to 10 feet. Pt requiring frequent rest breaks due to decreased activity tolerance.) General ADL Comments: Pt is making good progress and demonstrates increased safety and independence with ADLs and functional mobility/transfers this session. Pt continuing to require +2 assist for safety/equipment/line management with ADLs in standing and during functional mobility/transfers.    Extremity/Trunk Assessment Upper Extremity Assessment Upper Extremity Assessment: Overall WFL for tasks assessed   Lower Extremity Assessment Lower Extremity Assessment: Defer to PT evaluation        Vision       Perception     Praxis      Cognition Arousal/Alertness: Awake/alert Behavior During Therapy: WFL for tasks assessed/performed Overall Cognitive Status: Impaired/Different from baseline Area of Impairment: Problem solving, Safety/judgement, Following commands,  Attention                   Current Attention Level: Sustained   Following Commands: Follows one step commands consistently, Follows one step commands with increased time Safety/Judgement: Decreased awareness of safety Awareness: Intellectual Problem Solving: Slow processing (Requires visual cues) General Comments: Pt with improved processing speed this date, pt defers need for interpreter and communicating his needs clearly in [place]. After initial cues for safety/sequencing with transfers, good carryover of cues. Pt c/o fatigue with standing but BP stable.        Exercises      Shoulder Instructions       General Comments Pt with increased HR to 110 bpm in standing and with functional mobility. All other VSS on RA throughout session.    Pertinent Vitals/ Pain       Pain Assessment Pain Assessment: Faces Faces Pain Scale: Hurts a little bit Pain Location: B LE with movement Pain Descriptors / Indicators: Guarding, Tender Pain Intervention(s): Limited activity within patient's tolerance, Monitored during session, Repositioned  Home Living                                          Prior Functioning/Environment              Frequency  Min 2X/week        Progress Toward Goals  OT Goals(current goals can now be found in the care plan section)     Acute Rehab OT Goals Patient Stated Goal: To get better  Plan      Co-evaluation    PT/OT/SLP Co-Evaluation/Treatment: Yes Reason for Co-Treatment: For patient/therapist safety;To address functional/ADL transfers PT goals addressed during  session: Mobility/safety with mobility;Balance;Proper use of DME OT goals addressed during session: ADL's and self-care      AM-PAC OT "6 Clicks" Daily Activity     Outcome Measure   Help from another person eating meals?: A Little Help from another person taking care of personal grooming?: A Little Help from another person toileting, which includes  using toliet, bedpan, or urinal?: A Lot Help from another person bathing (including washing, rinsing, drying)?: A Lot Help from another person to put on and taking off regular upper body clothing?: A Little Help from another person to put on and taking off regular lower body clothing?: A Lot 6 Click Score: 15    End of Session Equipment Utilized During Treatment: Gait belt;Rolling walker (2 wheels)  OT Visit Diagnosis: Unsteadiness on feet (R26.81);Other (comment) (Decreased activity tolerance)   Activity Tolerance Patient tolerated treatment well   Patient Left in chair;with call bell/phone within reach;with chair alarm set   Nurse Communication          Time: 1610-9604 OT Time Calculation (min): 32 min  Charges: OT General Charges $OT Visit: 1 Visit OT Treatments $Self Care/Home Management : 8-22 mins  Gerhart Ruggieri "[name], OTR/L, MA Acute Rehab [phone]   Lendon Colonel 08/22/2022, 4:57 PM

## 2022-08-22 NOTE — Progress Notes (Signed)
SLP Cancellation Note  Patient Details Name: Tyler [name] MRN: [id number] DOB: 1950/11/13   Cancelled treatment:     (RN reports pt tolerating po intake, requires medications crushed with applesauce, will follow up once more for dysphagia management if pt remains hospitalized)  [name], MS [facility] SLP Acute Rehab Services Office [phone]  Chales Abrahams 08/22/2022, 10:14 AM

## 2022-08-22 NOTE — Progress Notes (Signed)
Physical Therapy Treatment Patient Details Name: Tyler [name] MRN: [id number] DOB: [birth year] Today's Date: 08/22/2022   History of Present Illness Pt is a 72 yo male who presented to [facility] ED on 6/3 with poor po intake, abdominal pain, near syncope, fever, hypotension, and elevated lactic acid level. Pt found to have sepsis, severe anemia, subacute L4 fx, and generalized lymphadenopathy from high grade large B cell lymphoma. While at [facility], pt had seizure like activity, Rt facial paresthesia, and developed renal failure. Seen by cardiology for PAF. PICC placed and started on R mini CHOP on 08/05/22. Worsening renal fx, concern for tumor lysis syndrome and received rasburicase. + Concern for aspiration pneumonitis. In ICU started on CRRT and subsequently switched to HD treatments and transferred to [facility] on 6/22/202. S/p Endoscopy 6/26 w/findings: Normal esophagus, non-bleeding gastric ulcer with no stigmata of bleeding. Biopsied. Normal examined duodenum. PMH: PE, hypertension, paroxysmal atrial fibrillation, and recurrent UTIs.    PT Comments    Pt received in supine, agreeable to therapy session with good participation and improved tolerance for transfer and gait training with RW. Pt needing up to min guard with increased time to perform and safety cues initially with good carryover of safety instructions. Pt standing/gait tolerance limited due to fatigue and BP stable with standing at 1 minute. Pt with good effort and performed multiple short gait trials at bedside with +2 for safety/line mgmt and in case chair needed to be pulled closer. Pt with improving activity tolerance and very motivated to progress strength/endurance, continue to recommend short term moderate to high intensity post-acute rehab upon DC, especially if it can be split up over 7 days if needed. Pt continues to benefit from PT services to progress toward functional mobility goals, plan to bring HEP handout with photos next session to reinforce  supine/seated BLE exercises.    Recommendations for follow up therapy are one component of a multi-disciplinary discharge planning process, led by the attending physician.  Recommendations may be updated based on patient status, additional functional criteria and insurance authorization.  Follow Up Recommendations  Can patient physically be transported by private vehicle: No (pending progress; improving)    Assistance Recommended at Discharge Intermittent Supervision/Assistance  Patient can return home with the following Two people to help with walking and/or transfers;A little help with bathing/dressing/bathroom;Assistance with cooking/housework;Assist for transportation;Help with stairs or ramp for entrance   Equipment Recommendations  Other (comment) (TBD pending progress)    Recommendations for Other Services Rehab consult     Precautions / Restrictions Precautions Precautions: Fall Precaution Comments: L4 compression fracture, Protective Precs (chemo on 7/2) Restrictions Weight Bearing Restrictions: No     Mobility  Bed Mobility Overal bed mobility: Needs Assistance Bed Mobility: Supine to Sit     Supine to sit: HOB elevated, Min guard     General bed mobility comments: Good initiation of BLE, needs increased time due to c/o BLE pain, pt using bed rail to assist with movements, HOB up    Transfers Overall transfer level: Needs assistance Equipment used: Rolling walker (2 wheels) Transfers: Sit to/from Stand, Bed to chair/wheelchair/BSC Sit to Stand: +2 safety/equipment, Min guard, From elevated surface           General transfer comment: Pt needs initial cues not to pull up on RW (and visual demo), after second trial, pt able to recall safe placement with slight delay. Steadier when pushing up from arm rests. STS from EOB, then chair without arm rests x2 reps, then from recliner  height with pillow    Ambulation/Gait Ambulation/Gait assistance: +2  safety/equipment, Min guard Gait Distance (Feet): 10 Feet (67ft, 63ft, 36ft with seated breaks between due to fatigue) Assistive device: Rolling walker (2 wheels) Gait Pattern/deviations: Decreased step length - right, Decreased step length - left, Decreased stride length, Trunk flexed Gait velocity: reduced     General Gait Details: very short distances at bedside with chairs on each end of bed for safety, pt needed seated break after ~76ft at most, x3 trials. Pt c/o fatigue but not lightheaded, BP stable supine to standing; reliant on RW but no significant buckling, mild buckle on one occasion just before sitting but pt stabilizes with RW support   Stairs             Wheelchair Mobility    Modified Rankin (Stroke Patients Only)       Balance Overall balance assessment: Needs assistance Sitting-balance support: Single extremity supported, Bilateral upper extremity supported, Feet supported, No upper extremity supported Sitting balance-Leahy Scale: Fair Sitting balance - Comments: supervision at most Postural control:  (when moving Bilateral LE) Standing balance support: Bilateral upper extremity supported, During functional activity, Reliant on assistive device for balance Standing balance-Leahy Scale: Fair Standing balance comment: with RW support; anticipate Poor without given mild buckling with fatigue                            Cognition Arousal/Alertness: Awake/alert Behavior During Therapy: WFL for tasks assessed/performed Overall Cognitive Status: Difficult to assess Area of Impairment: Following commands                   Current Attention Level: Sustained   Following Commands: Follows one step commands inconsistently, Follows one step commands with increased time   Awareness: Intellectual Problem Solving: Slow processing General Comments: Pt with improved processing speed this date, pt defers need for interpreter and communicating his needs  clearly in [place]. After initial cues for safety/sequencing with transfers, good carryover of cues. Pt c/o fatigue with standing but BP stable.        Exercises General Exercises - Lower Extremity Ankle Circles/Pumps: AROM, Both, 5 reps, Supine Other Exercises Other Exercises: IS x3 reps in chair, pt achieves up to 1750 encouraged hourly use    General Comments General comments (skin integrity, edema, etc.): BP supine 129/71 (87) HR 90's bpm; BP 130/78 (92) standing HR 104-110 bpm with standing tasks; SpO2 WFL on RA throughout      Pertinent Vitals/Pain Pain Assessment Pain Assessment: Faces Faces Pain Scale: Hurts a little bit Pain Location: B LE with movement Pain Descriptors / Indicators: Guarding, Tender Pain Intervention(s): Monitored during session, Repositioned     PT Goals (current goals can now be found in the care plan section) Acute Rehab PT Goals Patient Stated Goal: to get stronger PT Goal Formulation: With patient/family Time For Goal Achievement: 09/01/22 Progress towards PT goals: Progressing toward goals    Frequency    Min 3X/week      PT Plan Current plan remains appropriate    Co-evaluation PT/OT/SLP Co-Evaluation/Treatment: Yes Reason for Co-Treatment: For patient/therapist safety;To address functional/ADL transfers PT goals addressed during session: Mobility/safety with mobility;Balance;Proper use of DME        AM-PAC PT "6 Clicks" Mobility   Outcome Measure  Help needed turning from your back to your side while in a flat bed without using bedrails?: A Little Help needed moving from lying on your back to  sitting on the side of a flat bed without using bedrails?: A Little Help needed moving to and from a bed to a chair (including a wheelchair)?: A Little Help needed standing up from a chair using your arms (e.g., wheelchair or bedside chair)?: A Little Help needed to walk in hospital room?: Total (<20) Help needed climbing 3-5 steps with a  railing? : Total 6 Click Score: 14    End of Session Equipment Utilized During Treatment: Gait belt Activity Tolerance: Patient tolerated treatment well Patient left: in chair;with call bell/phone within reach;with chair alarm set Nurse Communication: Mobility status (tarry BM) PT Visit Diagnosis: Other abnormalities of gait and mobility (R26.89);Difficulty in walking, not elsewhere classified (R26.2);Unsteadiness on feet (R26.81);Muscle weakness (generalized) (M62.81)     Time: 6213-0865 PT Time Calculation (min) (ACUTE ONLY): 30 min  Charges:  $Gait Training: 8-22 mins                     Caius Silbernagel P., PTA Acute Rehabilitation Services Secure Chat Preferred 9a-5:30pm Office: [phone]    Dorathy Kinsman [facility] 08/22/2022, 12:03 PM

## 2022-08-22 NOTE — Progress Notes (Addendum)
Progress Note    Tyler [name]  WUX:[id number] DOB: [birth year]  DOA: 07/29/2022 PCP: Nechama Guard, FNP      Brief Narrative:    Medical records reviewed and are as summarized below:  Tyler [name] is a 72 y.o. male 72 yo male with history of PE, hypertension, paroxysmal atrial fibrillation, recurrent UTIs, low grade myelodysplastic syndrome found to have generalized lymphadenopathy from high grade large B cell lymphoma. Plan was to start on R-CHOP. Came to ER with near syncope. He had fever and hypotension with elevated lactic acid level. Started on ABx.   He had seizure like activity, and Rt facial paresthesia. He developed renal failure. Seen by cardiology for PAF. Had PICC placed and started on R mini CHOP on 08/05/22. Renal fx got worse and nephrology consulted. Had elevated uric acid with concern for tumor lysis syndrome and received rasburicase. Rituxan held because of this. Renal fx and mental status got worse. Concern for aspiration pneumonitis.  He was seen by the ICU team and kept in ICU, also seen by nephrology, oncology along with cardiology.  He was eventually started on CRRT and subsequently switched to hemodialysis.  He was transferred to the hospitalist team on 08/17/2022 and was transferred from [facility] to [facility] Events: I   6/03 Presented to ED for fever, near-syncope. Presumed sepsis. Admitted to [facility]. 6/13 Worsening acute renal failure as evidenced by rising BUN/Cr, hyperkalemia to 5.6. PCCM consulted for ARF/need for RRT, line placement. Intubated. R IJ Trialysis catheter placed. CRRT started. 6/15 transfuse 1 unit PRBC 6/16 extubate 6/17 no acute events overnight, remains on CRRT 6/18 no acute events overnight, remains on CRRT 6/19 Rituximab, CRRT 08/17/2022 started on HD 08/17/2022 transferred to hospitalist service     Assessment/Plan:   Principal Problem:   Sepsis (HCC) Active Problems:   Prostate cancer  (HCC)   Hypertension   Paroxysmal atrial fibrillation (HCC)   Normocytic anemia   Hyponatremia   Diffuse large B-cell lymphoma of lymph nodes of multiple regions (HCC)   Anemia   At high risk of tumor lysis syndrome   Spontaneous tumor lysis syndrome   Acute renal failure with tubular necrosis (HCC)   Aspiration into airway   Tumor lysis syndrome   Chemotherapy-induced neutropenia (HCC)   Heme positive stool   Melena   Acute gastric ulcer   Nutrition Problem: Increased nutrient needs Etiology: chronic illness (lymphoma)  Signs/Symptoms: estimated needs   Body mass index is 29.21 kg/m.    Aspiration pneumonia, sepsis, acute hypoxic respiratory failure with compromised airway.  Initially intubated kept in ICU, extubated on 08/11/2022, has finished antibiotics on 08/13/2022.  NG tube DC'd on 08/18/22, speech following.  Continue to monitor closely. He is tolerating room air  Sepsis with hypotension.  Required antibiotics and pressor support in ICU, now resolved.  Off of antibiotics and pressors.  Monitor.     Acute renal failure: Suspect multifactorial related to possible sepsis as well as contribution of tumor lysis syndrome given elevated uric acid etc. s/p rasburicase x 2, seen by nephrology, has right IJ HD catheter placed, initially underwent CRRT treatment now started on HD treatment on 08/17/2022.  Hemodialysis has been held with the hope that kidney function recovers.  Urine output is adequate.  Follow-up with nephrologist  Paroxysmal A-fib: No AC due to history of bleeding - seen by cardiology, currently on amiodarone for rate control.  Not on anticoagulation because of high bleeding  risk.  High-grade Follicular B cell lymphoma.  Being followed by hematology, s/p mini CHOP treatment on 08/06/2022, complicated by severe pancytopenia, tumor lysis syndrome with AKI. Also received Rituxan this admission.  Defer further management to oncology service.      Pancytopenia:  --S/p  transfusion of 1 unit on the morning of 08/22/2022. Hemoglobin improved from 6.4-8.  Monitor H&H and transfuse as needed. S/p transfusion 1 unit PRBC on 08/17/22 & another on 08/19/22.  S/p transfusion with 2 units of PRBCs on 08/20/2022 for hemoglobin of 6.8.  Had about 13 units of PRBCs and 2 units of platelets thus far on this admission. Leukopenia and thrombocytopenia have resolved. Granix (stopped 08/18/22).  Defer further management to hematology and oncology team.   Tumor lysis syndrome: Continue allopurinol.  Recent hemodialysis for AKI   Melena, acute GI bleeding on the afternoon of 08/20/2022: Patient had melena overnight even after EGD. S/p EGD on 08/21/2022 showed normal esophagus, nonbleeding gastric ulcer with no stigmata of bleeding and normal duodenum. Continue Protonix. Reengage Dr. Rhea Belton, gastroenterologist.  Patient may need to have a bleeding scan if GI bleeding recurs.  Metabolic encephalopathy.  Improving.  Continue to monitor.   Generalized weakness and dysphagia.  Was on NG tube feeds, speech evaluation  >> D3 diet, discontinued core track tube 08/18/22 as patient complaining of discomfort.   Past history of hep B.  Now on immunosuppressive including Rituxan for lymphoma.  Dr. Thedore Mins, ID specialist, recommended tenofovir while on rituximab because of moderate risk for reactivation of hepatitis B. Hep B surface antigen nonreactive, hep B surface antibody 13.6 suggestive of immunity.       Diet Order             DIET DYS 3 Room service appropriate? Yes; Fluid consistency: Thin  Diet effective now                            Consultants: Nephrologist Oncologist Cardiologist Intensivist Gastroenterologist  Procedures: EGD 08/21/2022 Extubation 08/11/2022 Intubation 08/08/2022 Hemodialysis catheter right IJ 08/08/2022    Medications:    acetaminophen  650 mg Per Tube Once   allopurinol  100 mg Oral BID   amiodarone  200 mg Oral Daily    Chlorhexidine Gluconate Cloth  6 each Topical Q0600   insulin aspart  0-6 Units Subcutaneous Q4H   multivitamin  1 tablet Oral QHS   mouth rinse  15 mL Mouth Rinse 4 times per day   pantoprazole  40 mg Oral BID AC   Tenofovir Alafenamide Fumarate  25 mg Oral Daily   Continuous Infusions:  sodium chloride Stopped (08/13/22 0021)   sodium chloride 65 mL/hr at 08/21/22 1313     Anti-infectives (From admission, onward)    Start     Dose/Rate Route Frequency Ordered Stop   08/21/22 1045  Tenofovir Alafenamide Fumarate TABS 25 mg        25 mg Oral Daily 08/21/22 0947     08/08/22 1500  piperacillin-tazobactam (ZOSYN) IVPB 3.375 g        3.375 g 100 mL/hr over 30 Minutes Intravenous Every 6 hours 08/08/22 1321 08/13/22 0934   07/30/22 1600  vancomycin (VANCOREADY) IVPB 1750 mg/350 mL  Status:  Discontinued        1,750 mg 175 mL/hr over 120 Minutes Intravenous Every 24 hours 07/29/22 2108 08/01/22 1135   07/30/22 0000  ceFEPIme (MAXIPIME) 2 g in sodium chloride 0.9 % 100 mL IVPB  2 g 200 mL/hr over 30 Minutes Intravenous Every 8 hours 07/29/22 2108 08/05/22 1833   07/29/22 1530  ceFEPIme (MAXIPIME) 2 g in sodium chloride 0.9 % 100 mL IVPB        2 g 200 mL/hr over 30 Minutes Intravenous  Once 07/29/22 1517 07/29/22 1628   07/29/22 1530  metroNIDAZOLE (FLAGYL) IVPB 500 mg        500 mg 100 mL/hr over 60 Minutes Intravenous  Once 07/29/22 1517 07/29/22 1701   07/29/22 1530  vancomycin (VANCOCIN) IVPB 1000 mg/200 mL premix  Status:  Discontinued        1,000 mg 200 mL/hr over 60 Minutes Intravenous  Once 07/29/22 1517 07/29/22 1520   07/29/22 1530  vancomycin (VANCOREADY) IVPB 1750 mg/350 mL        1,750 mg 175 mL/hr over 120 Minutes Intravenous  Once 07/29/22 1520 07/29/22 1908              Family Communication/Anticipated D/C date and plan/Code Status   DVT prophylaxis: Place and maintain sequential compression device Start: 08/07/22 0936 SCDs Start: 07/30/22  0108     Code Status: Full Code  Family Communication: None Disposition Plan: Plan to discharge to acute inpatient rehab when medically stable   Status is: Inpatient Remains inpatient appropriate because: On hemodialysis for AKI, GI bleeding       Subjective:   Interval events noted.  He had 3 watery black stools overnight.  No vomiting or abdominal pain.  He feels weak and tired.  Objective:    Vitals:   08/22/22 0800 08/22/22 0915 08/22/22 1000 08/22/22 1100  BP:      Pulse: 87 92  95  Resp:      Temp: 98.9 F (37.2 C) 98 F (36.7 C)    TempSrc: Oral Oral    SpO2: 98% 95% 95% 95%  Weight:      Height:       No data found.   Intake/Output Summary (Last 24 hours) at 08/22/2022 1139 Last data filed at 08/22/2022 0915 Gross per 24 hour  Intake 690.42 ml  Output 1750 ml  Net -1059.58 ml   Filed Weights   08/19/22 1200 08/20/22 0300 08/21/22 0500  Weight: 79.9 kg 83.9 kg 84.6 kg    Exam:   GEN: NAD SKIN: Warm and dry EYES: Pale but anicteric ENT: MMM CV: RRR PULM: CTA B ABD: soft, ND, NT, +BS CNS: AAO x 3, non focal EXT: No edema or tenderness      Data Reviewed:   I have personally reviewed following labs and imaging studies:  Labs: Labs show the following:   Basic Metabolic Panel: Recent Labs  Lab 08/15/22 1528 08/15/22 2244 08/16/22 0423 08/17/22 0805 08/18/22 0436 08/19/22 0332 08/20/22 0302 08/21/22 0334 08/22/22 0323  NA 135   < > 133* 133* 133* 132* 137 132* 137  K 4.4   < > 5.0 5.8* 5.1 5.4* 4.0 4.2 3.7  CL 106   < > 101 101 97* 95* 102 101 102  CO2 26   < > 25 23 27 25 24  21* 21*  GLUCOSE 144*   < > 117* 108* 113* 88 78 99 89  BUN 37*   < > 60* 88* 66* 81* 45* 55* 57*  CREATININE 1.65*   < > 2.59* 4.17* 3.68* 4.90* 3.38* 3.96* 4.05*  CALCIUM 7.2*   < > 7.4* 7.6* 7.7* 8.1* 7.2* 7.1* 7.5*  MG  --   --  2.4  --  1.9 2.1 1.6* 1.5*  --   PHOS 2.3*  --  3.0 3.9  --   --   --   --   --    < > = values in this interval not  displayed.   GFR Estimated Creatinine Clearance: 17.6 mL/min (A) (by C-G formula based on SCr of 4.05 mg/dL (H)). Liver Function Tests: Recent Labs  Lab 08/15/22 1528 08/16/22 0423 08/17/22 0805 08/20/22 0302  AST  --   --   --  25  ALT  --   --   --  18  ALKPHOS  --   --   --  111  BILITOT  --   --   --  0.8  PROT  --   --   --  3.9*  ALBUMIN 1.8* 1.8* 1.7* 1.7*   No results for input(s): "LIPASE", "AMYLASE" in the last 168 hours. No results for input(s): "AMMONIA" in the last 168 hours. Coagulation profile No results for input(s): "INR", "PROTIME" in the last 168 hours.  CBC: Recent Labs  Lab 08/18/22 0436 08/19/22 0332 08/20/22 0302 08/20/22 1429 08/21/22 0334 08/22/22 0323  WBC 5.4 14.3* 12.3* 13.6* 12.1* 10.3  NEUTROABS 4.4 13.4* 10.3*  --  7.7 7.7  HGB 7.5* 7.0* 7.6* 6.4* 7.8* 6.4*  HCT 22.1* 21.4* 23.5* 19.9* 23.5* 19.6*  MCV 85.7 86.6 86.7 89.6 87.0 89.9  PLT 104* 131* 154 171 152 166   Cardiac Enzymes: No results for input(s): "CKTOTAL", "CKMB", "CKMBINDEX", "TROPONINI" in the last 168 hours. BNP (last 3 results) No results for input(s): "PROBNP" in the last 8760 hours. CBG: Recent Labs  Lab 08/21/22 1556 08/21/22 2010 08/21/22 2349 08/22/22 0449 08/22/22 0807  GLUCAP 107* 86 83 85 90   D-Dimer: No results for input(s): "DDIMER" in the last 72 hours. Hgb A1c: No results for input(s): "HGBA1C" in the last 72 hours. Lipid Profile: No results for input(s): "CHOL", "HDL", "LDLCALC", "TRIG", "CHOLHDL", "LDLDIRECT" in the last 72 hours. Thyroid function studies: No results for input(s): "TSH", "T4TOTAL", "T3FREE", "THYROIDAB" in the last 72 hours.  Invalid input(s): "FREET3" Anemia work up: No results for input(s): "VITAMINB12", "FOLATE", "FERRITIN", "TIBC", "IRON", "RETICCTPCT" in the last 72 hours. Sepsis Labs: Recent Labs  Lab 08/20/22 0302 08/20/22 1429 08/21/22 0334 08/22/22 0323  WBC 12.3* 13.6* 12.1* 10.3    Microbiology No results  found for this or any previous visit (from the past 240 hour(s)).  Procedures and diagnostic studies:  No results found.             LOS: 24 days   Tyler [name]  [facility]   Pager on [email or web address]. If 7PM-7AM, please contact night-coverage at [email or web address]     08/22/2022, 11:39 AM

## 2022-08-22 NOTE — Progress Notes (Signed)
Transfusing 1 unit RBC this morning for Hgb 6.4.

## 2022-08-22 NOTE — TOC Progression Note (Signed)
Transition of Care [facility]) - Progression Note    Patient Details  Name: Tyler [name] MRN: [id number] Date of Birth: [birth year]  Transition of Care [facility]) CM/SW Contact  Mearl Latin, LCSW Phone Number: 08/22/2022, 3:06 PM  Clinical Narrative:    CSW received request from SNF to ensure family presence if needed to assist patient in completing admission paperwork, though they do utilize interpreting services. CSW provided update to patient's daughter and she confirmed she lives in [place]. Patient expressed understanding of PT recommendation and is agreeable to SNF placement at time of discharge. CSW discussed insurance authorization process and provided in network SNF bed offers that can transport to chemo appointments. CSW continuing to follow for medical stability.   Skilled Nursing Rehab Facilities-   [email or web address]   Ratings out of 5 stars (5 the highest)   Name Address  Phone # Quality Care Staffing Health Inspection Overall  [facility] [address] [phone] 2 1 5 4   [facility] [address] [place] [phone] 4 1 3 2   [facility] [address], [facility] [phone] 2 1 1 1   [facility] [address], [place] [phone] 4 1 3 2   [facility]  [address], [place] [phone] 3 2 5 5   [facility] [address], [facility] [phone] 2 1 2 1   [facility] [address], [place] [phone] 4 1 2 1   [facility] [phone] [address] [place] [phone] 2 4 3 3   [address] ([facility]) [address] [place] [phone] 3 2 2 2   [facility] [address] [place], [place] [phone] 1 2 1 1   [facility] ([place]) [address], [place] [phone] 3 1 1 1   [facility] [address] Liliane Shi [phone] 4 3 4 4   [facility] [address] [place] [phone] 3 4 3 3            [facility] [address] [place] [phone]      [facility], [place] [address], [place] [phone] 1 1 2 1   [facility] [address] [place] [phone] 2 2 4 4   [facility] [address] [phone] 2 1 4 3   [facility] [address] [place] [phone] 3 3 3 3           [address] (no [facility]) [address], [place] [phone] 4 4 5 5   [facility] (No [employer]) [address] Lavera Guise [phone] 2 2 4 4   [facility] [address], [place] [phone] 2 1 2 1   [facility] (No [employer]) [address], [place] [phone] 5 5 5 5   [facility] [address], [facility] [phone] 2 3 5 5   [facility] [address] [place] [phone] 2 1 1 1   [facility] [address] Liliane Shi [phone] 5 2 5 5   [facility]  [address] [place] [phone] 2 2 2 2   [facility] [address] [place] [phone] 4 2 1 1   [facility] [address] [place], [place] [phone] 2 2 3 3           [facility] [address], [place] [phone] 1 1 1 1   [facility] [address], Evlyn Clines  [phone] 2 3 3 3   [facility] (No Humana) [address] [place] [phone] 2 1 3 2   [facility]) [address],  Rosalita Levan [phone] 1 1 1 1   [facility] [address], [place]alita Levan [phone] 3 2 5 5   [facility] [address], [place] [phone] 2 1 1 1           [facility] [address] [place], [place] [phone] 4 4 5 5   [facility])  [address], [place] [phone] 2 1 2 1   [facility]) [address] [place] [phone]  1 4 3   [facility] [address] [place] [phone] 3 5 4 5   [address] [place], [place] [phone] 3 2 2 2   [facility]) [address] [place] [phone] 2 1 3 2       Expected Discharge Plan: Skilled Nursing Facility Barriers to Discharge: Continued Medical Work up, English as a second language teacher, SNF Pending bed offer  Expected Discharge Plan and Services In-house Referral: Clinical Social Work                                             Social Determinants of Health (SDOH) Interventions SDOH Screenings   Food Insecurity: No Food Insecurity (07/30/2022)  Housing: Low Risk  (07/30/2022)  Transportation Needs: No Transportation Needs (07/30/2022)  Utilities: Not At Risk (07/30/2022)  Tobacco Use: Low Risk  (08/21/2022)    Readmission Risk Interventions    08/22/2022    3:06 PM 07/30/2022    2:44 PM  Readmission Risk Prevention Plan  Transportation Screening Complete Complete  PCP or Specialist Appt within 3-5 Days  Complete  HRI or Home Care Consult  Complete  Social Work Consult for Recovery Care Planning/Counseling  Complete  Palliative Care Screening  Not Applicable  Medication Review [facility]) Complete Complete  PCP or Specialist appointment within 3-5 days of discharge Complete   HRI or Home Care Consult Complete   SW Recovery Care/Counseling Consult Complete   Palliative Care Screening Complete   Skilled Nursing Facility Complete

## 2022-08-22 NOTE — Progress Notes (Signed)
[facility] Progress Note  Subjective: UOP 2475 yesterday and 1100 today so far. Creat is 4.0 today (3.9 yest, 3.4 day before). Pt up in chair. No c/o's, no n/v/d.   Vitals:   08/22/22 1000 08/22/22 1100 08/22/22 1208 08/22/22 1300  BP:   136/81   Pulse:  95  88  Resp:   20   Temp:   98 F (36.7 C)   TempSrc:   Oral   SpO2: 95% 95% 98%   Weight:      Height:        Exam:   alert, nad   no jvd  Chest cta bilat  Cor reg no RG  Abd soft ntnd no ascites   Ext no LE edema   Alert, NF, ox3   RIJ temp HD cath      Date   Creat  eGFR  notes    July 2-17, 2021 0.87- 1.17 > 60 ml/min Isolated creat of 1.52 on 7/07 Dec 2021  0.69- 1.16 > 60 ml/min     6/03- 08, 2024 0.70- 1.01 > 4ml/min This admit    6/10- 24, 2024 1.36- 4.90   This admit         UA 6/3 - negative     UA 6/6 - negative     UA 6/11 - small bili, mod Hb, ket 5, prot 100, few bact, 6-10 rbc, 21-50 wbc, 0-5 epi      Assessment/ Plan: AKI - b/l creatinine 0.6- 0.9 here on admission. Creat increased up to 1.3 on 6/10 and 2.3 on 6/11.  UA showed ^wbc, min rbc's/ protein. Renal US showed no obstruction. Pt did had IV contrast exposure and some hypotension, but primarily AKI was felt to be more likely due to tumor lysis syndrome. Uric acid/ hyperkalemia peaked and then resolved w/ CRRT (6/13) and rasburicase. CRRT continued until 6/20 and pt then transition[facility] to [facility], which he had on 6/22 and [facility]/24 at [facility]. UOP then increased significantly on 6/22. We started IVF"s and UOP remains very good. Creat was 3.4, then 3.9 yest and 4.0 today. Seems to be cresting. Will continue to hold off on dialysis, suspect recovery phase. Lower IVF's to 50 cc/hr. F/u labs in am.  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 Ritux given 6/19. Lumps in the neck and L axilla have improved significantly.  Pancytopenia -  leukopenia and plts resolved, Hb remains low in 7- 8 range.  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 Hb was low prior to AKI and Hb remains in the 6- 8 range, getting prbc's prn.    Vinson Moselle MD  [facility] 08/22/2022, 3:45 PM  Recent Labs  Lab 08/16/22 0423 08/17/22 0155 08/17/22 0805 08/18/22 0436 08/20/22 0302 08/20/22 1429 08/21/22 0334 08/22/22 0323 08/22/22 1228  HGB 7.6*   < >  --    < > 7.6*   < > 7.8* 6.4* 8.0*  ALBUMIN 1.8*  --  1.7*  --  1.7*  --   --   --   --   CALCIUM 7.4*  --  7.6*   < > 7.2*  --  7.1* 7.5*  --   PHOS 3.0  --  3.9  --   --   --   --   --   --  CREATININE 2.59*  --  4.17*   < > 3.38*  --  3.96* 4.05*  --   K 5.0  --  5.8*   < > 4.0  --  4.2 3.7  --    < > = values in this interval not displayed.    No results for input(s): "IRON", "TIBC", "FERRITIN" in the last 168 hours. Inpatient medications:  sodium chloride   Intravenous Once   acetaminophen  650 mg Per Tube Once   allopurinol  100 mg Oral BID   amiodarone  200 mg Oral Daily   Chlorhexidine Gluconate Cloth  6 each Topical Q0600   insulin aspart  0-6 Units Subcutaneous Q4H   multivitamin  1 tablet Oral QHS   mouth rinse  15 mL Mouth Rinse 4 times per day   pantoprazole  40 mg Oral BID AC   Tenofovir Alafenamide Fumarate  25 mg Oral Daily    sodium chloride Stopped (08/13/22 0021)   sodium chloride 65 mL/hr at 08/21/22 1313   docusate, ipratropium-albuterol, lip balm, melatonin, ondansetron **OR** ondansetron (ZOFRAN) IV, oxyCODONE, phenol, polyethylene glycol, polyvinyl alcohol, senna, sodium chloride flush, traZODone

## 2022-08-23 ENCOUNTER — Encounter (HOSPITAL_COMMUNITY): Payer: Self-pay | Admitting: Internal Medicine

## 2022-08-23 ENCOUNTER — Inpatient Hospital Stay (HOSPITAL_COMMUNITY): Payer: 59

## 2022-08-23 DIAGNOSIS — R6521 Severe sepsis with septic shock: Secondary | ICD-10-CM | POA: Diagnosis not present

## 2022-08-23 DIAGNOSIS — M7989 Other specified soft tissue disorders: Secondary | ICD-10-CM

## 2022-08-23 DIAGNOSIS — A419 Sepsis, unspecified organism: Secondary | ICD-10-CM | POA: Diagnosis not present

## 2022-08-23 LAB — CBC
HCT: 24.4 % — ABNORMAL LOW (ref 39.0–52.0)
Hemoglobin: 8.2 g/dL — ABNORMAL LOW (ref 13.0–17.0)
MCH: 29.5 pg (ref 26.0–34.0)
MCHC: 33.6 g/dL (ref 30.0–36.0)
MCV: 87.8 fL (ref 80.0–100.0)
Platelets: 245 10*3/uL (ref 150–400)
RBC: 2.78 MIL/uL — ABNORMAL LOW (ref 4.22–5.81)
RDW: 15.7 % — ABNORMAL HIGH (ref 11.5–15.5)
WBC: 11.8 10*3/uL — ABNORMAL HIGH (ref 4.0–10.5)
nRBC: 0 % (ref 0.0–0.2)

## 2022-08-23 LAB — GLUCOSE, CAPILLARY
Glucose-Capillary: 113 mg/dL — ABNORMAL HIGH (ref 70–99)
Glucose-Capillary: 113 mg/dL — ABNORMAL HIGH (ref 70–99)
Glucose-Capillary: 135 mg/dL — ABNORMAL HIGH (ref 70–99)
Glucose-Capillary: 87 mg/dL (ref 70–99)
Glucose-Capillary: 87 mg/dL (ref 70–99)
Glucose-Capillary: 92 mg/dL (ref 70–99)

## 2022-08-23 LAB — RENAL FUNCTION PANEL
Albumin: 1.7 g/dL — ABNORMAL LOW (ref 3.5–5.0)
Anion gap: 10 (ref 5–15)
BUN: 45 mg/dL — ABNORMAL HIGH (ref 8–23)
CO2: 22 mmol/L (ref 22–32)
Calcium: 7.4 mg/dL — ABNORMAL LOW (ref 8.9–10.3)
Chloride: 104 mmol/L (ref 98–111)
Creatinine, Ser: 3.72 mg/dL — ABNORMAL HIGH (ref 0.61–1.24)
GFR, Estimated: 17 mL/min — ABNORMAL LOW (ref >60)
Glucose, Bld: 88 mg/dL (ref 70–99)
Phosphorus: 5.6 mg/dL — ABNORMAL HIGH (ref 2.5–4.6)
Potassium: 3.2 mmol/L — ABNORMAL LOW (ref 3.5–5.1)
Sodium: 136 mmol/L (ref 135–145)

## 2022-08-23 LAB — TYPE AND SCREEN
ABO/RH(D): O POS
Antibody Screen: NEGATIVE

## 2022-08-23 LAB — BPAM RBC: ISSUE DATE / TIME: 202406270633

## 2022-08-23 MED ORDER — INSULIN ASPART 100 UNIT/ML IJ SOLN: 0.0000 [IU] | Freq: Three times a day (TID) | INTRAMUSCULAR | Status: DC

## 2022-08-23 MED ORDER — POTASSIUM CHLORIDE CRYS ER 20 MEQ PO TBCR
40.0000 meq | EXTENDED_RELEASE_TABLET | Freq: Once | ORAL | Status: DC
  Filled 2022-08-23: qty 2

## 2022-08-23 MED ORDER — POTASSIUM CHLORIDE 20 MEQ PO PACK
40.0000 meq | PACK | Freq: Once | ORAL | Status: AC
  Administered 2022-08-23: 40 meq via ORAL
  Filled 2022-08-23: qty 2

## 2022-08-23 NOTE — Hospital Course (Signed)
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  He developed worsening renal failure on 6/13 and was intubated and transferred to ICU for CRRT.  He was transfused on 6/15 and extubated on 6/16, remaining on CRRT through 6/19.  He was started on Rituximab on 6/19 and transitioned to HD on 6/22 and 6/24.  His creatinine is improving and he is thought to be in recovery phase off HD at this time.  He is awaiting SNF placement.

## 2022-08-23 NOTE — Progress Notes (Signed)
RUE venous duplex has been completed.   Results can be found under chart review under CV PROC. 08/23/2022 6:20 PM Lamonte Hartt RVT, RDMS

## 2022-08-23 NOTE — Progress Notes (Signed)
RUA PICC line swollen warm and tender below insertion site. MD notified and IV team. MD to evaluate PICC line in RUA.

## 2022-08-23 NOTE — Progress Notes (Signed)
Speech Language Pathology Treatment: Dysphagia  Patient Details Name: Tyler [name] MRN: [id number] DOB: 07-Nov-1950 Today's Date: 08/23/2022 Time: 4098-1191 SLP Time Calculation (min) (ACUTE ONLY): 10 min  Assessment / Plan / Recommendation Clinical Impression  Mr. Neglia is doing much better with motivation to eat and his swallowing.  He is tolerating a dysphagia 3/thin liquid diet. He prefers to crush pills though he is capable of swallowing them whole. Today he demonstrated thorough mastication of a cookie, the appearance of a brisk swallow response, and no s/s of aspiration with thin liquids nor with mixed consistencies.  He may advance to regular solids if he prefers.  No further SLP f/u is needed.  Our service will sign off.   HPI HPI: 72 y.o. male admitted to [facility] on 07/29[facility]22 with sepsis, generalized lymphadenopathy. 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 Hospitalization marked by swallow eval 6/8 with recs for regular diet/thin liquids; 6/10 NG for decompression, NPO; 6/13 worsening renal failure, intubated 6/13-6/16; CRRT 6/13-20.  Transferred to [facility] for HD 6/21.      SLP Plan  All goals met      Recommendations for follow up therapy are one component of a multi-disciplinary discharge planning process, led by the attending physician.  Recommendations may be updated based on patient status, additional functional criteria and insurance authorization.    Recommendations  Diet recommendations: Dysphagia 3 (mechanical soft);Thin liquid Liquids provided via: Cup;Straw Medication Administration: Whole meds with puree Supervision: Patient able to self feed                  Oral care BID     Dysphagia, unspecified (R13.10)     All goals met    Alexandar Weisenberger L. [name], MA CCC/SLP Clinical Specialist - Acute Care SLP Acute Rehabilitation Services Office number  [phone]  [name]  08/23/2022, 10:26 AM

## 2022-08-23 NOTE — Progress Notes (Signed)
RUA DL PICC noted swollen below th site, painful and hard to touch. Primary RN made aware. RN to [employer] provider. Will follow up.

## 2022-08-23 NOTE — Progress Notes (Signed)
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  He developed worsening renal failure on 6/13 and was intubated and transferred to ICU for CRRT.  He was transfused on 6/15 and extubated on 6/16, remaining on CRRT through 6/19.  He was started on Rituximab on 6/19 and transitioned to HD on 6/22 and 6/24.  His creatinine is improving and he is thought to be in recovery phase off HD at this time.  He is awaiting SNF placement.  Assessment and Plan:  Septic shock due to Aspiration pneumonia Patient presented with acute respiratory failure, required mechanical ventilation  Required pressors while in ICU, now off Extubated on 08/11/2022 and has weaned off all O2 Finished antibiotics on 08/13/2022   NG tube DC'd on 08/18/22 Speech following   Acute renal failure Suspect multifactorial related to possible sepsis as well as contribution of tumor lysis syndrome given elevated uric acid  S/p rasburicase x 2 Nephrology consulted Had right IJ HD catheter placed Underwent CRRT -> HD treatment on 08/17/2022 Hemodialysis has been held given renal recovery   Urine output is adequate R internal jugular will be discontinued Ongoing improvement in renal function Follow-up with nephrologist  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 DVT DVT US ordered DVT would be problematic given recent GI bleeding and pancytopenia Will also need access; since he will need long-term access and no longer needs TDC, port-a-cath access requested   For now, will follow up the DVT study and right PICC can be used of there is no DVT IR has set up for possible port placement for Monday  Pancytopenia S/p transfusion of roughly 13 units of PRBCs and 2 units of platelets thus far since admission Leukopenia and thrombocytopenia have resolved Given Granix, which was stopped on 6/23 Defer further management to hematology and oncology team   Acute upper GI bleeding  Started on 6/25 S/p EGD on 08/21/2022 showed normal esophagus, nonbleeding gastric ulcer with no stigmata of bleeding and normal duodenum He reports ongoing bleeding with 1 BM yesterday, 3 the day prior Continue Protonix Reengage Dr. Rhea Belton, gastroenterologist - may need to have a bleeding scan if GI bleeding recurs   Acute metabolic encephalopathy   Present on admission Appears to have resolved   Generalized weakness and dysphagia   Was on NG tube feeds, now off Speech evaluation  >> D3 diet Discontinued core track tube 08/18/22  Appears to need SNF bed (one that can transport patient to [facility] appointments)   H/o Hepatitis B   On Rituxan for lymphoma  Dr. Thedore Mins, ID specialist, recommended tenofovir while on rituximab because of moderate risk for  reactivation of hepatitis B. Hep B surface antigen nonreactive, hep B surface antibody 13.6 suggestive of immunity       Subjective: Patient is feeling better, improved LAD.  He continues to have periodic bloody stools, 1 yesterday.  RUE is swollen and painful at site of PICC line.   Reports that his thighs are weak.  Physical Exam: Vitals:   08/23/22 0400 08/23/22 0745 08/23/22 0800 08/23/22 1135  BP:  120/73  123/76  Pulse:   88 80  Resp: (!) 21 19  20   Temp:  98.2 F (36.8 C)  98.3 F (36.8 C)  TempSrc:  Oral Oral Oral  SpO2:  97% 98%   Weight:      Height:       General:  Appears calm and comfortable and is in NAD Eyes:  EOMI, normal lids, iris ENT:  grossly normal hearing, lips & tongue, mmm Neck:  no LAD, masses or thyromegaly Cardiovascular:  RRR, no m/r/g. No LE edema.  Respiratory:   CTA bilaterally with no wheezes/rales/rhonchi.  Normal respiratory effort. Abdomen:  soft, NT, ND Skin:  no rash or induration seen on limited exam Musculoskeletal:  grossly normal tone BUE/BLE, good ROM, no bony abnormality Psychiatric:  grossly normal mood and affect, speech fluent and appropriate, AOx3 Neurologic:  CN 2-12 grossly intact, moves all extremities in coordinated fashion   Radiological Exams on Admission: Independently reviewed - see discussion in A/P where applicable  No results found.     Labs on Admission: I have personally reviewed the available labs and imaging studies at the time of the admission.  Pertinent labs:    K+ 3.2 BUN 45/Creatinine 3.72/GFR 17 - improving Albumin 1.7 Hgb 8 on 6/27, up from 6.4 on 6/27  Family Communication: None present; I spoke with his daughter by telephone  Disposition: Status is: Inpatient Remains inpatient appropriate because: ongoing evaluation and treatment  Planned Discharge Destination: Skilled nursing facility    Time spent: 50 minutes  Author: Jonah Blue, MD 08/23/2022 3:03 PM  For on call review [email or web address].

## 2022-08-23 NOTE — Progress Notes (Signed)
RN to bedside to remove HD cath per order.  There is also a concern r/t pt's PICC line & RUE being swollen. Messages sent to Drs. Noe Gens, charge RN and primary RN for clarification prior to d/c any lines.  Additionally, given concern for pt's RUE, recommending a scan to r/o DVT.  Dr. Ophelia Charter to place the order.  All lines remain at this time. Will follow up.

## 2022-08-23 NOTE — Consult Note (Signed)
Chief Complaint: Patient was seen in consultation today for [facility] placement  Chief Complaint  Patient presents with   Near Syncope   Loss of Consciousness   at the request of Johney Maine  Referring Physician(s): Johney Maine  Supervising Physician: Gilmer Mor  Patient Status: [facility] - In-pt  History of Present Illness: Maston Vanetten is a 72 y.o. male with PMHs of HTN, PAF, prostate CA, recurrent UTI, myelodysplastic syndrome, and lymphoma who was referred to IR for possible PAC placement.   Patient is known to IR service for BMBx on 12/13/21 and left axillary LN bx on 07/16/22.   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 Patient presented to ED on 07/29/22 due syncopal event at home, he was found to have sepsis, anemia and thrombocytopenia, he was admitted for further evaluation. Hospital course was complicated by development of a fib with RVR.   Oncology was consulted on 6/10 and patient was started on chemotherapy via right arm  PICC placed by IV team. Nephrology was consulted on 6/11 due to acute renal failure. Hospital course was complicated by worsening renal failure and respiratory failure, patient required intubation and initiation of CRRT on 6/13.   Patient has recovered from respiratory failure, tolerated extubation well and has shown renal recovery, the right internal jugular temp cath can be removed per nephrology.   On 6/27, patient was found to have swelling and tenderness in his right arm which raised concern for DVT/hematoma in the setting of PICC in place. Duplex study was ordered by the hospitalist team and IR was consulted for image guided PAC placement.   Patient laying in bed, not in acute distress, spouse at bedside.  Patient declined tele interpreter, he is able to communicate without problem and asks appropriate questions.  Denise headache, fever, chills, shortness of breath,  cough, chest pain, abdominal pain, nausea ,vomiting, and bleeding.  Reports that his right arm started swelling recently, and it is tender to touch.  Discussed with the patient and his spouse regarding PAC placement as inpt VS outpt, they  are open for either options. They were notified that port placement while inpatient is typically not recommended due to higher risk of port related infection.   Past Medical History:  Diagnosis Date   Cancer [facility])    prostate   Follicular lymphoma (HCC) 2024   History of pulmonary embolism    Hypertension    Myelodysplastic syndrome (HCC)    PAF (paroxysmal atrial fibrillation) (HCC)    Recurrent UTI     Past Surgical History:  Procedure Laterality Date   BIOPSY  08/21/2022   Procedure: BIOPSY;  Surgeon: Beverley Fiedler, MD;  Location: [facility] ENDOSCOPY;  Service: Gastroenterology;;   ESOPHAGOGASTRODUODENOSCOPY (EGD) WITH PROPOFOL N/A 08/21/2022   Procedure: ESOPHAGOGASTRODUODENOSCOPY (EGD) WITH PROPOFOL;  Surgeon: Beverley Fiedler, MD;  Location: [facility] ENDOSCOPY;  Service: Gastroenterology;  Laterality: N/A;   LYMPHADENECTOMY Bilateral 09/01/2019   Procedure: LYMPHADENECTOMY;  Surgeon: Sebastian Ache, MD;  Location: [facility] ORS;  Service: Urology;  Laterality: Bilateral;   ROBOT ASSISTED LAPAROSCOPIC RADICAL PROSTATECTOMY N/A 09/01/2019   Procedure: XI ROBOTIC ASSISTED LAPAROSCOPIC RADICAL PROSTATECTOMY;  Surgeon: Sebastian Ache, MD;  Location: [facility] ORS;  Service: Urology;  Laterality: N/A;  3 HRS    Allergies: Amitriptyline, Amlodipine, and Neurontin [gabapentin]  Medications: Prior to Admission medications   Medication Sig Start Date End Date Taking? Authorizing Provider  B Complex Vitamins (VITAMIN B COMPLEX)  TABS Take 1 tablet by mouth daily. 12/21/21  Yes Amin, Loura Halt, MD  cholecalciferol (VITAMIN D3) 25 MCG (1000 UNIT) tablet Take 2 tablets (2,000 Units total) by mouth daily. 01/21/22  Yes Johney Maine, MD  diclofenac Sodium (VOLTAREN) 1 % GEL  APPLY 4 G TOPICALLY 4 (FOUR) TIMES DAILY. APPLY TO RIGHT LATERAL HIP AND THIGH DOWN TO KNEE Patient taking differently: Apply 4 g topically daily as needed (For pain). 06/12/22  Yes Nestor Ramp, MD  folic acid (FOLVITE) 1 MG tablet Take 1 mg by mouth daily. 03/27/22  Yes [provider]  lidocaine-prilocaine (EMLA) cream Apply to affected area once Patient taking differently: Apply 1 Application topically daily as needed (For port). 07/26/22  Yes Johney Maine, MD  lisinopril (ZESTRIL) 5 MG tablet Take 5 mg by mouth daily.   Yes [provider]  Menthol-Methyl Salicylate (MUSCLE RUB) 10-15 % CREA Apply 1 Application topically daily as needed for muscle pain.   Yes [provider]  metoCLOPramide (REGLAN) 10 MG tablet Take 1 tablet (10 mg total) by mouth every 6 (six) hours as needed for nausea. 07/15/22  Yes Gerhard Munch, MD  Multiple Vitamin (MULTIVITAMIN WITH MINERALS) TABS tablet Take 1 tablet by mouth daily. 12/21/21  Yes Amin, Ankit Chirag, MD  ondansetron (ZOFRAN) 8 MG tablet Take 1 tablet (8 mg total) by mouth every 8 (eight) hours as needed for nausea or vomiting. Start on the third day after cyclophosphamide chemotherapy. 07/26/22  Yes Johney Maine, MD  oxyCODONE (OXY IR/ROXICODONE) 5 MG immediate release tablet Take 1 tablet (5 mg total) by mouth every 6 (six) hours as needed for severe pain. 07/19/22  Yes Johney Maine, MD  prochlorperazine (COMPAZINE) 10 MG tablet Take 1 tablet (10 mg total) by mouth every 6 (six) hours as needed for nausea or vomiting. 07/26/22  Yes Johney Maine, MD  senna-docusate (SENNA S) 8.6-50 MG tablet Take 2 tablets by mouth at bedtime as needed for mild constipation. 07/19/22  Yes Johney Maine, MD  allopurinol (ZYLOPRIM) 100 MG tablet TAKE 1 TABLET BY MOUTH TWICE A DAY 08/15/22   Johney Maine, MD  gabapentin (NEURONTIN) 100 MG capsule Take 1 capsule (100 mg total) by mouth at bedtime for 14 days,  THEN 2 capsules (200 mg total) at bedtime. 05/31/22 07/14/22  Claudie Leach, DO     Family History  Problem Relation Age of Onset   Stroke Father     Social History   Socioeconomic History   Marital status: Married    Spouse name: Not on file   Number of children: Not on file   Years of education: Not on file   Highest education level: Not on file  Occupational History   Not on file  Tobacco Use   Smoking status: Never   Smokeless tobacco: Never  Vaping Use   Vaping Use: Never used  Substance and Sexual Activity   Alcohol use: Yes    Comment: at least one 40 oz beer daily   Drug use: Never   Sexual activity: Not on file  Other Topics Concern   Not on file  Social History Narrative   Not on file   Social Determinants of Health   Financial Resource Strain: Not on file  Food Insecurity: No Food Insecurity (07/30/2022)   Hunger Vital Sign    Worried About Running Out of Food in the Last Year: Never true    Ran Out of Food in  the Last Year: Never true  Transportation Needs: No Transportation Needs (07/30/2022)   PRAPARE - Administrator, Civil Service (Medical): No    Lack of Transportation (Non-Medical): No  Physical Activity: Not on file  Stress: Not on file  Social Connections: Not on file     Review of Systems: A 12 point ROS discussed and pertinent positives are indicated in the HPI above.  All other systems are negative.  Vital Signs: BP 123/76 (BP Location: Left Arm)   Pulse 80   Temp 98.3 F (36.8 C) (Oral)   Resp 20   Ht 5\' 7"  (1.702 m)   Wt 186 lb 8.2 oz (84.6 kg)   SpO2 98%   BMI 29.21 kg/m    Physical Exam Vitals reviewed.  Constitutional:      General: He is not in acute distress.    Appearance: He is not ill-appearing.  HENT:     Head: Normocephalic.     Mouth/Throat:     Mouth: Mucous membranes are moist.     Pharynx: Oropharynx is clear.  Eyes:     Extraocular Movements: Extraocular movements intact.  Neck:      Comments: + R internal jugular Trialysis catheter in place  Cardiovascular:     Rate and Rhythm: Normal rate and regular rhythm.     Heart sounds: Normal heart sounds.  Pulmonary:     Effort: Pulmonary effort is normal.     Breath sounds: Normal breath sounds.  Abdominal:     General: Abdomen is flat. Bowel sounds are normal.     Palpations: Abdomen is soft.  Musculoskeletal:        General: Swelling and tenderness present.     Cervical back: Neck supple.     Comments: Firm swelling noted at right humerus distal to right PICC insertion site, it is tender. No increased warmth, erythema. PICC insertion site is clean and dray, dressed appropriately. No other right arm swelling noted.   Skin:    General: Skin is warm and dry.     Coloration: Skin is not jaundiced or pale.  Neurological:     Mental Status: He is alert and oriented to person, place, and time.  Psychiatric:        Mood and Affect: Mood normal.        Behavior: Behavior normal.        Judgment: Judgment normal.     MD Evaluation Airway: WNL Heart: WNL Abdomen: WNL Chest/ Lungs: WNL ASA  Classification: 3 Mallampati/Airway Score: Two  Imaging: DG Abd 1 View  Result Date: 08/16/2022 CLINICAL DATA:  Ileus. EXAM: ABDOMEN - 1 VIEW COMPARISON:  08/08/2022. FINDINGS: The bowel gas pattern is normal. An enteric tube terminates in the stomach. No radio-opaque calculi or other significant radiographic abnormality are seen. IMPRESSION: Nonobstructive bowel-gas pattern. Electronically Signed   By: Thornell Sartorius M.D.   On: 08/16/2022 04:56   DG CHEST PORT 1 VIEW  Result Date: 08/11/2022 CLINICAL DATA:  [id number] Follow-up exam [id number] EXAM: PORTABLE CHEST 1 VIEW COMPARISON:  August 09, 2022 FINDINGS: The cardiomediastinal silhouette is unchanged and enlarged in contour with widening of the superior paramediastinal stripes.ETT tip terminates 4.8 cm above the carina. Enteric tube tip and side port project over the proximal stomach. RIGHT  upper extremity PICC tip terminates over the RIGHT atrium. RIGHT neck CVC tip terminates over the inferior SVC. Layering bilateral pleural effusions. No pneumothorax. Hazy bibasilar opacities most consistent with atelectasis. IMPRESSION: 1. Support apparatus  as described above. 2. Layering bilateral pleural effusions with associated atelectasis. Electronically Signed   By: Meda Klinefelter M.D.   On: 08/11/2022 11:20   DG CHEST PORT 1 VIEW  Result Date: 08/09/2022 CLINICAL DATA:  72 year old male with PICC line pulled out slightly. High-grade large B-cell lymphoma. EXAM: PORTABLE CHEST 1 VIEW COMPARISON:  Portable chest 08/08/2022 and earlier. FINDINGS: Portable AP view at 0401 hours. Similar lordotic positioning. Right PICC line tip position at the right mediastinal contour appears stable from yesterday, cavoatrial junction level when allowing for lower lung volumes. Stable right IJ approach central line. Endotracheal tube tip remains at the level the clavicles. Enteric tube courses to the abdomen, tip not included. Stable bulky mediastinal widening seen on CT 08/03/2022 secondary to malignant appearing lymphadenopathy. Stable cardiac size and mediastinal contours. Ongoing low lung volumes, lower since yesterday. Veiling opacity at the left lung base, probably in part related to layering pleural effusions seen on the CT. Paucity of bowel gas in the upper abdomen. IMPRESSION: 1. Right PICC line tip position appears stable from yesterday, cavoatrial junction level when allowing for lower lung volumes. 2. Other lines and tubes stable. Lower lung volumes, with continued left lung veiling opacity at least in part due to pleural effusion seen recently by CT. 3. Stable bulky mediastinal widening secondary to malignant lymphadenopathy. Electronically Signed   By: Odessa Fleming M.D.   On: 08/09/2022 04:35   DG CHEST PORT 1 VIEW  Result Date: 08/08/2022 CLINICAL DATA:  Status post intubation EXAM: PORTABLE CHEST 1 VIEW  COMPARISON:  08/08/2022 FINDINGS: Cardiac shadow is within normal limits. Endotracheal tube, gastric catheter, right PICC and right jugular dialysis catheter are again noted and stable. Persistent widening of the mediastinum is seen consistent with the known adenopathy. Small left effusion is noted. No focal infiltrate is seen. IMPRESSION: Tubes and lines as described above. Overall appearance is stable with widened mediastinum. New small left effusion is seen. Electronically Signed   By: Alcide Clever M.D.   On: 08/08/2022 23:52   DG Chest Port 1 View  Result Date: 08/08/2022 CLINICAL DATA:  Central line placement EXAM: PORTABLE CHEST 1 VIEW COMPARISON:  Chest radiograph 08/05/2022 FINDINGS: The endotracheal tube tip is proximally 3.5 cm from the carina. The right IJ vascular sheath terminates in the mid SVC. A right upper extremity PICC terminates in the region of the cavoatrial junction. The enteric catheter tip courses off the field of view. The side-hole is in the stomach. The heart size is stable. Widening of the upper mediastinum is unchanged consistent with known lymphadenopathy seen on prior CT. There is no focal consolidation or pulmonary edema. There is no pleural effusion or pneumothorax There is no acute osseous abnormality. IMPRESSION: 1. Support devices as above in appropriate position. 2. Unchanged widening of the mediastinum consistent with known lymphadenopathy. No new or worsening focal airspace disease. Electronically Signed   By: Lesia Hausen M.D.   On: 08/08/2022 14:23   DG Abd Portable 1V  Result Date: 08/08/2022 CLINICAL DATA:  Ileus EXAM: PORTABLE ABDOMEN - 1 VIEW COMPARISON:  One-view abdomen 08/06/2022 FINDINGS: The bowel gas pattern is now normal. No residual distended bowel is present. Side port of the NG tube is in the stomach. No radio-opaque calculi or other significant radiographic abnormality are seen. IMPRESSION: 1. Resolution of ileus. 2. Side port of the NG tube is in the  stomach. Electronically Signed   By: Marin Roberts M.D.   On: 08/08/2022 09:31   DG  Abd 1 View  Result Date: 08/06/2022 CLINICAL DATA:  Abdominal distension. EXAM: ABDOMEN - 1 VIEW COMPARISON:  Radiograph yesterday FINDINGS: Tip and side port of the enteric tube below the diaphragm in the stomach. No small bowel distension or evidence of obstruction. There is mild gaseous distension of transverse and descending colon. Small volume of formed stool in the right colon. No radiopaque calculi. No acute osseous abnormalities are seen. IMPRESSION: 1. Tip and side port of the enteric tube below the diaphragm in the stomach. 2. Mild gaseous distension of transverse and descending colon which may represent colonic ileus. No small bowel obstruction. Electronically Signed   By: Narda Rutherford M.D.   On: 08/06/2022 18:14   US RENAL  Result Date: 08/06/2022 CLINICAL DATA:  Acute kidney injury. EXAM: RENAL / URINARY TRACT ULTRASOUND COMPLETE COMPARISON:  CT 07/29/2022 FINDINGS: Right Kidney: Renal measurements: 10 x 5.3 x 6.6 cm = volume: 183 mL. No hydronephrosis. Normal parenchymal echogenicity. 3.6 cm cyst arises from the lower pole. This needs no further imaging follow-up. No visible stone or solid lesion. Additional cyst on prior CT not well seen. Left Kidney: Renal measurements: 10.8 x 5.6 x 5.3 cm = volume: 167 mL. No hydronephrosis. Normal parenchymal echogenicity. 2.9 cm cyst in the lower pole. Some of the smaller cysts on CT are not seen by ultrasound. No visible stone or solid lesion. Bladder: Not seen, may be empty. Other: None. IMPRESSION: No obstructive uropathy. Electronically Signed   By: Narda Rutherford M.D.   On: 08/06/2022 18:12   DG Abd 1 View  Result Date: 08/05/2022 CLINICAL DATA:  Nasogastric tube placement. EXAM: ABDOMEN - 1 VIEW COMPARISON:  August 05, 2022 FINDINGS: A nasogastric tube is seen with its distal tip overlying the expected region of the gastric antrum. A small amount of  radiopaque contrast is seen overlying the expected region of the gastric fundus. The bowel gas pattern is normal. No radio-opaque calculi are seen. IMPRESSION: Nasogastric tube positioning, as described above. Electronically Signed   By: Aram Candela M.D.   On: 08/05/2022 19:05   DG Abd 1 View  Result Date: 08/05/2022 CLINICAL DATA:  [id number] Vomiting [id number] EXAM: ABDOMEN - 1 VIEW COMPARISON:  08/01/2022 FINDINGS: The bowel gas pattern is normal. residual oral contrastIn the gastric fundus. No radio-opaque calculi or other significant radiographic abnormality are seen. IMPRESSION: Negative. Electronically Signed   By: Corlis Leak M.D.   On: 08/05/2022 17:02   DG Chest Port 1V same Day  Result Date: 08/05/2022 CLINICAL DATA:  Dyspnea EXAM: PORTABLE CHEST 1 VIEW COMPARISON:  08/03/2022 FINDINGS: Cardiac shadow is within normal limits. Persistent widening of the superior mediastinum is noted related to lymphadenopathy. Lungs are clear bilaterally. No bony abnormality is noted. IMPRESSION: Persistent lymphadenopathy with mediastinal widening. No other focal abnormality is seen. Electronically Signed   By: Alcide Clever M.D.   On: 08/05/2022 11:11   [place] EKG SITE RITE  Result Date: 08/05/2022 If Site Rite image not attached, placement could not be confirmed due to current cardiac rhythm.  CT CHEST W CONTRAST  Result Date: 08/03/2022 CLINICAL DATA:  Inpatient. Abnormal chest radiograph. Chest wall mass. * Tracking Code: BO * EXAM: CT CHEST WITH CONTRAST TECHNIQUE: Multidetector CT imaging of the chest was performed during intravenous contrast administration. RADIATION DOSE REDUCTION: This exam was performed according to the departmental dose-optimization program which includes automated exposure control, adjustment of the mA and/or kV according to patient size and/or use of iterative reconstruction technique. CONTRAST:  75mL OMNIPAQUE IOHEXOL 300 MG/ML  SOLN COMPARISON:  Chest radiograph from one day prior.  07/07/2022 chest CT. FINDINGS: Cardiovascular: Normal heart size. No significant pericardial effusion/thickening. Left anterior descending coronary atherosclerosis. Great vessels are normal in course and caliber. No central pulmonary emboli. Mediastinum/Nodes: No significant thyroid nodules. Unremarkable esophagus. Bulky bilateral axillary adenopathy, increased from 07/07/2022. Representative short axis diameter 3.8 cm right axillary node (series 3/image 57), previously 2.7 cm. Bilateral bulky lower neck adenopathy is increased. Representative left lower neck 3.0 cm node (series 3/image 6), previously 1.8 cm. Bulky bilateral mediastinal adenopathy in the prevascular, AP window, right paratracheal and subcarinal chains, increased. Representative 3.0 cm right paratracheal node (series 3/image 53), previously 1.8 cm. Increased bilateral hilar adenopathy. Representative 2.0 cm left hilar node (series 3/image 77), previously 1.2 cm. Lungs/Pleura: No pneumothorax. Small dependent bilateral pleural effusions with associated compressive mild-to-moderate atelectasis in the posterior lower lobes bilaterally, new. No lung masses or significant pulmonary nodules in the aerated portions of the lungs. Upper abdomen: Multiple splenic masses, for example 3.0 cm medially (series 3/image 131), not previously discretely visualized. Bulky upper abdominal lymphadenopathy in the gastrohepatic ligament, porta hepatis, portacaval, aortocaval and left para-aortic chains, increased. Representative 2.1 cm gastrohepatic ligament node (series 3/image 130), previously 1.8 cm. Representative 2.0 cm left para-aortic node (series 3/image 155), previously 1.3 cm. Interval growth of multiple upper omental soft tissue nodules, largest 3.5 x 2.2 cm (series 3/image 153), previously 2.0 x 1.2 cm. Musculoskeletal: No aggressive appearing focal osseous lesions. Moderate thoracic spondylosis. IMPRESSION: 1. Rapid interval progression of bulky bilateral  axillary, bilateral hilar, bilateral mediastinal, bilateral lower neck and upper abdominal lymphadenopathy since recent 07/07/2022 chest CT. Multiple new splenic masses. Enlarging upper omental soft tissue nodules. 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 The axillary adenopathy should be amenable to percutaneous biopsy. 2. Small dependent bilateral pleural effusions with associated compressive mild-to-moderate posterior lower lobe atelectasis, new. 3. One vessel coronary atherosclerosis. Electronically Signed   By: Delbert Phenix M.D.   On: 08/03/2022 12:02   DG Chest Port 1V same Day  Result Date: 08/02/2022 CLINICAL DATA:  Dyspnea shortness of breath EXAM: PORTABLE CHEST 1 VIEW COMPARISON:  Multiple exams, including 07/31/2022 FINDINGS: Substantially widened mediastinum at 14.9 cm presumably due to adenopathy given findings on prior imaging workups. This is stable compared to 07/31/2022 and substantially increased from 05/23/2022, and questionably increased from 07/06/2022 (AP projection on today's exam may be magnify the mediastinum more than prior PA projections and CT imaging). Heart size within normal limits. Hazy indistinct density at the left lung base possibly from atelectasis or pneumonia, minimally improved from previous. IMPRESSION: 1. Substantially widened mediastinum at 14.9 cm, presumably due to adenopathy given previous chest CT findings. 2. Hazy indistinct density at the left lung base possibly from atelectasis or pneumonia, minimally improved from previous. Electronically Signed   By: Gaylyn Rong M.D.   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 intake, tachycardia and episode of shaking and unresponsiveness then confused thereafter. EEG to evaluate for seizure Level of alertness: Awake AEDs during EEG study: None  Technical aspects: This EEG study was done with scalp electrodes positioned according to the 10-20 International system of electrode placement. Electrical activity was reviewed with band pass filter of 1-70Hz , sensitivity of 7 uV/mm, display speed of 4mm/sec with a 60Hz  notched filter applied as appropriate. EEG data were recorded continuously and digitally stored.  Video monitoring was available and reviewed as appropriate. Description: The posterior dominant rhythm consists of 7.5 Hz activity of moderate voltage (25-35 uV) seen predominantly in posterior head regions, symmetric and reactive to eye opening and eye closing. EEG showed intermittent generalized sharply contoured high amplitude 2-3hz  delta slowing. Hyperventilation and photic stimulation were not performed.   ABNORMALITY - Intermittent slow, generalized IMPRESSION: This study is suggestive of mild diffuse encephalopathy, nonspecific etiology. No seizures or epileptiform discharges were seen throughout the recording.  If suspicion for interictal activity remains a concern, a prolonged study can be considered. Charlsie Quest   DG Abd 1 View  Result Date: 08/01/2022 CLINICAL DATA:  Abdominal distension. EXAM: ABDOMEN - 1 VIEW COMPARISON:  CT AP 07/29/2022 FINDINGS: No dilated loops of large or small bowel identified. Stool noted within the colon. No abnormal abdominal or pelvic calcifications. Visualized osseous structures appear intact. IMPRESSION: Nonobstructive bowel gas pattern. Electronically Signed   By: Signa Kell M.D.   On: 08/01/2022 12:41   DG Chest Port 1 View  Result Date: 07/31/2022 CLINICAL DATA:  Possible aspiration EXAM: PORTABLE CHEST 1 VIEW  COMPARISON:  07/29/2022, 07/07/2022 FINDINGS: Cardiac shadow is stable. Widened mediastinum is again identified accentuated by the portable technique. This is consistent with the known mediastinal adenopathy seen on prior CT. Lungs are well aerated bilaterally. Mild bibasilar atelectasis is seen. No sizable effusion is noted. No bony abnormality is seen. IMPRESSION: Widened mediastinum consistent with the known mediastinal adenopathy. Mild bibasilar atelectasis. Electronically Signed   By: Alcide Clever M.D.   On: 07/31/2022 23:06   CT MAXILLOFACIAL WO CONTRAST  Result Date: 07/30/2022 CLINICAL DATA:  Right facial pain EXAM: CT MAXILLOFACIAL WITHOUT CONTRAST TECHNIQUE: Multidetector CT imaging of the maxillofacial structures was performed. Multiplanar CT image reconstructions were also generated. RADIATION DOSE REDUCTION: This exam was performed according to the departmental dose-optimization program which includes automated exposure control, adjustment of the mA and/or kV according to patient size and/or use of iterative reconstruction technique. COMPARISON:  None Available. FINDINGS: Osseous: No fracture or mandibular dislocation. No destructive process. Large periapical lucency at the root of the most posterior remaining right maxillary 2. Orbits: Negative. No traumatic or inflammatory finding. Sinuses: Right anterior ethmoid and maxillary sinus complete opacification with polypoid contours. Soft tissues: Multiple enlarged submandibular lymph nodes, measuring up to 1.6 cm. No abscess or fluid collection. Limited intracranial: No significant or unexpected finding. IMPRESSION: 1. Large periapical lucency at the root of the most posterior remaining right maxillary tooth. No associated abscess. 2. Right anterior ethmoid and maxillary sinus complete opacification with polypoid contours, possibly antrochoanal polyp. 3. Multiple enlarged submandibular lymph nodes, measuring up to 1.6 cm, likely reactive.  Electronically Signed   By: Deatra Robinson M.D.   On: 07/30/2022 20:38   MR BRAIN W WO CONTRAST  Result Date: 07/30/2022 CLINICAL DATA:  Seizure and headache EXAM: MRI HEAD WITHOUT AND WITH CONTRAST TECHNIQUE: Multiplanar, multiecho pulse sequences of the brain and surrounding structures were obtained without and with intravenous contrast. CONTRAST:  9mL GADAVIST GADOBUTROL 1 MMOL/ML IV SOLN COMPARISON:  None Available. FINDINGS: Brain: No acute infarct, mass effect or extra-axial collection. No acute or chronic  hemorrhage. There is multifocal hyperintense T2-weighted signal within the white matter. Parenchymal volume and CSF spaces are normal. The midline structures are normal. There is no abnormal contrast enhancement. Vascular: Major flow voids are preserved. Skull and upper cervical spine: Normal calvarium and skull base. Visualized upper cervical spine and soft tissues are normal. Sinuses/Orbits:Opacification of the right maxillary and right anterior ethmoid sinuses. Normal orbits. IMPRESSION: 1. No acute intracranial abnormality. 2. Multifocal hyperintense T2-weighted signal within the white matter, most consistent with chronic small vessel ischemia. 3. Polypoid lesion of the right maxillary and ethmoid sinuses, possibly antrochoanal polyp. Nonemergent ENT consultation might be helpful. Electronically Signed   By: Deatra Robinson M.D.   On: 07/30/2022 20:34   CT ABDOMEN PELVIS WO CONTRAST  Result Date: 07/29/2022 CLINICAL DATA:  Acute abdominal pain. EXAM: CT ABDOMEN AND PELVIS WITHOUT CONTRAST TECHNIQUE: Multidetector CT imaging of the abdomen and pelvis was performed following the standard protocol without IV contrast. RADIATION DOSE REDUCTION: This exam was performed according to the departmental dose-optimization program which includes automated exposure control, adjustment of the mA and/or kV according to patient size and/or use of iterative reconstruction technique. COMPARISON:  CT examination dated  Jul 15, 2022 FINDINGS: Lower chest: Bibasilar subsegmental atelectasis. Hepatobiliary: No focal liver abnormality is seen. No gallstones, gallbladder wall thickening, or biliary dilatation. Pancreas: Mild generalized pancreatic atrophy. No pancreatic ductal dilatation or surrounding inflammatory changes. Spleen: Normal in size without focal abnormality. Adrenals/Urinary Tract: Adrenal glands are unremarkable. Kidneys are normal, without renal calculi, focal lesion, or hydronephrosis. Bilateral exophytic renal cysts, likely simple cysts. Bladder is unremarkable. Stomach/Bowel: Stomach is within normal limits. Appendix appears normal. Scattered colonic diverticulosis without evidence of acute diverticulitis. No evidence of bowel wall thickening, distention, or inflammatory changes. Vascular/Lymphatic: No significant vascular findings are present. There numerous lymph nodes of multiple sizes which are para-aortic, mesenteric, porta hepatis as well as multiple lymph nodes about the spleen as well as few lymph nodes in the pelvis. These are not significantly changed since prior examination. Reproductive: Postsurgical changes in the prostate bed. Other: Scattered soft tissue densities in the anterior abdominal wall, which may represent lymph nodes. No abdominopelvic ascites. Musculoskeletal: Compression fracture of the L4 vertebral body is unchanged. No acute osseous abnormality. IMPRESSION: 1. No CT evidence of acute abdominal/pelvic process. 2. Scattered colonic diverticulosis without evidence of acute diverticulitis. 3. Numerous lymph nodes of multiple sizes are present throughout the abdomen and pelvis predominantly in the mesentery and para-aortic region, which are not significantly changed since prior examination. 4. Chronic compression fracture of the L4 vertebral body. Electronically Signed   By: Larose Hires D.O.   On: 07/29/2022 19:26   CT Head Wo Contrast  Result Date: 07/29/2022 CLINICAL DATA:  Seizures,  new onset. EXAM: CT HEAD WITHOUT CONTRAST TECHNIQUE: Contiguous axial images were obtained from the base of the skull through the vertex without intravenous contrast. RADIATION DOSE REDUCTION: This exam was performed according to the departmental dose-optimization program which includes automated exposure control, adjustment of the mA and/or kV according to patient size and/or use of iterative reconstruction technique. COMPARISON:  None Available. FINDINGS: Brain: No evidence of acute infarction, hemorrhage, hydrocephalus, extra-axial collection or mass lesion/mass effect. Vascular: No hyperdense vessel or unexpected calcification. Skull: Normal. Negative for fracture or focal lesion. Sinuses/Orbits: Mucosal thickening of the ethmoid air cells. Other: None. IMPRESSION: 1. No acute intracranial abnormality. 2. Mucosal thickening of the ethmoid air cells. Electronically Signed   By: Larose Hires D.O.   On: 07/29/2022  19:15   ECHOCARDIOGRAM COMPLETE  Result Date: 07/29/2022    ECHOCARDIOGRAM REPORT   Patient Name:   Aizen Malo Date of Exam: 07/29/2022 Medical Rec #:  [id number]    Height:       67.0 in Accession #:    [id number]   Weight:       185.9 lb Date of Birth:  [birth year]    BSA:          1.960 m Patient Age:    72 years     BP:           111/73 mmHg Patient Gender: M            HR:           117 bpm. Exam Location:  Outpatient Procedure: 2D Echo, Color Doppler and Cardiac Doppler Indications:    Chemo  History:        Patient has prior history of Echocardiogram examinations, most                 recent 12/16/2021. Risk Factors:Hypertension. Prostate Cancer,                 Lymphoma, Chemotherapy.  Sonographer:    Milbert Coulter Referring Phys: Johney Maine  Sonographer Comments: Image acquisition challenging due to respiratory motion and Tachycardia. IMPRESSIONS  1. Intracavitary gradient is realted to dynamic function. Left ventricular ejection fraction, by estimation, is >75%. The left ventricle has  hyperdynamic function. The left ventricle has no regional wall motion abnormalities. There is mild concentric left ventricular hypertrophy. Left ventricular diastolic parameters were normal.  2. Right ventricular systolic function is hyperdynamic. The right ventricular size is normal. Tricuspid regurgitation signal is inadequate for assessing PA pressure.  3. The mitral valve is normal in structure. No evidence of mitral valve regurgitation.  4. The aortic valve is tricuspid. There is mild calcification of the aortic valve. Aortic valve regurgitation is not visualized. No aortic stenosis is present. Comparison(s): No significant change from prior study. FINDINGS  Left Ventricle: Intracavitary gradient is realted to dynamic function. Left ventricular ejection fraction, by estimation, is >75%. The left ventricle has hyperdynamic function. The left ventricle has no regional wall motion abnormalities. The left ventricular internal cavity size was small. There is mild concentric left ventricular hypertrophy. Left ventricular diastolic parameters were normal. Right Ventricle: The right ventricular size is normal. No increase in right ventricular wall thickness. Right ventricular systolic function is hyperdynamic. Tricuspid regurgitation signal is inadequate for assessing PA pressure. Left Atrium: Left atrial size was normal in size. Right Atrium: Right atrial size was normal in size. Pericardium: Trivial pericardial effusion is present. The pericardial effusion is posterior to the left ventricle. Mitral Valve: The mitral valve is normal in structure. No evidence of mitral valve regurgitation. Tricuspid Valve: The tricuspid valve is normal in structure. Tricuspid valve regurgitation is not demonstrated. Aortic Valve: The aortic valve is tricuspid. There is mild calcification of the aortic valve. Aortic valve regurgitation is not visualized. No aortic stenosis is present. Aortic valve mean gradient measures 10.0 mmHg.  Aortic valve peak gradient measures 18.5 mmHg. Aortic valve area, by VTI measures 2.53 cm. Pulmonic Valve: The pulmonic valve was not well visualized. Pulmonic valve regurgitation is not visualized. Aorta: The aortic root and ascending aorta are structurally normal, with no evidence of dilitation. IAS/Shunts: The interatrial septum was not well visualized.  LEFT VENTRICLE PLAX 2D LVIDd:         4.00 cm  Diastology LVIDs:         1.80 cm     LV e' medial:    8.24 cm/s LV PW:         1.10 cm     LV E/e' medial:  7.5 LV IVS:        1.20 cm     LV e' lateral:   11.70 cm/s LVOT diam:     2.10 cm     LV E/e' lateral: 5.3 LV SV:         72 LV SV Index:   37 LVOT Area:     3.46 cm  LV Volumes (MOD) LV vol d, MOD A2C: 27.2 ml LV vol d, MOD A4C: 49.7 ml LV vol s, MOD A2C: 7.1 ml LV vol s, MOD A4C: 13.6 ml LV SV MOD A2C:     20.1 ml LV SV MOD A4C:     49.7 ml LV SV MOD BP:      26.5 ml RIGHT VENTRICLE TAPSE (M-mode): 2.0 cm LEFT ATRIUM             Index LA diam:        3.50 cm 1.79 cm/m LA Vol (A2C):   26.6 ml 13.57 ml/m LA Vol (A4C):   36.9 ml 18.82 ml/m LA Biplane Vol: 31.3 ml 15.97 ml/m  AORTIC VALVE AV Area (Vmax):    2.77 cm AV Area (Vmean):   2.66 cm AV Area (VTI):     2.53 cm AV Vmax:           215.00 cm/s AV Vmean:          146.000 cm/s AV VTI:            0.285 m AV Peak Grad:      18.5 mmHg AV Mean Grad:      10.0 mmHg LVOT Vmax:         172.00 cm/s LVOT Vmean:        112.000 cm/s LVOT VTI:          0.208 m LVOT/AV VTI ratio: 0.73  AORTA Ao Asc diam: 2.90 cm MITRAL VALVE MV Area (PHT): 4.49 cm    SHUNTS MV Decel Time: 169 msec    Systemic VTI:  0.21 m MV E velocity: 61.90 cm/s  Systemic Diam: 2.10 cm MV A velocity: 89.20 cm/s MV E/A ratio:  0.69 Riley Lam MD Electronically signed by Riley Lam MD Signature Date/Time: 07/29/2022/4:01:01 PM    Final    DG Chest Port 1 View  Result Date: 07/29/2022 CLINICAL DATA:  Cough.  Syncope. EXAM: PORTABLE CHEST 1 VIEW COMPARISON:  May 23, 2022.   Jul 07, 2022. FINDINGS: Prominence of the superior mediastinum is noted consistent with adenopathy as noted on prior CT scan. Stable cardiac size. Lungs are clear. Bony thorax is unremarkable. IMPRESSION: Widening of superior mediastinum consistent with adenopathy noted on prior CT scan of Jul 07, 2022. This is concerning for lymphoma or metastatic disease. Electronically Signed   By: Lupita Raider M.D.   On: 07/29/2022 15:34    Labs:  CBC: Recent Labs    08/20/22 1429 08/21/22 0334 08/22/22 0323 08/22/22 1228 08/23/22 1003  WBC 13.6* 12.1* 10.3  --  11.8*  HGB 6.4* 7.8* 6.4* 8.0* 8.2*  HCT 19.9* 23.5* 19.6* 24.3* 24.4*  PLT 171 152 166  --  245    COAGS: Recent Labs    12/11/21 0500 12/11/21 1123 07/29/22 1520  INR 1.4* 1.4* 1.5*  APTT  --  30 28    BMP: Recent Labs    08/20/22 0302 08/21/22 0334 08/22/22 0323 08/23/22 0407  NA 137 132* 137 136  K 4.0 4.2 3.7 3.2*  CL 102 101 102 104  CO2 24 21* 21* 22  GLUCOSE 78 99 89 88  BUN 45* 55* 57* 45*  CALCIUM 7.2* 7.1* 7.5* 7.4*  CREATININE 3.38* 3.96* 4.05* 3.72*  GFRNONAA 19* 16* 15* 17*    LIVER FUNCTION TESTS: Recent Labs    08/10/22 0329 08/10/22 0334 08/11/22 0316 08/11/22 1544 08/12/22 0515 08/12/22 1400 08/16/22 0423 08/17/22 0805 08/20/22 0302 08/23/22 0407  BILITOT 5.3*  --  3.2*  --  2.5*  --   --   --  0.8  --   AST 41  --  33  --  31  --   --   --  25  --   ALT 27  --  31  --  31  --   --   --  18  --   ALKPHOS 77  --  84  --  79  --   --   --  111  --   PROT 4.4*  --  4.6*  --  4.2*  --   --   --  3.9*  --   ALBUMIN 1.7*   < > 1.8*  1.7*   < > 1.7*  1.6*   < > 1.8* 1.7* 1.7* 1.7*   < > = values in this interval not displayed.    TUMOR MARKERS: No results for input(s): "AFPTM", "CEA", "CA199", "CHROMGRNA" in the last 8760 hours.  Assessment and Plan: 72 y.o. male with lymphoma, poor venous access who is in need of long term CVC.  VSS - has been afebrile for several days  CBC with  fluctuating leukocytosis, recent CBC with diff on 6/26 showed no left shift. Stable anemia.  Not on AC/AP Allergies reviewed   Risks and benefits of image guided port-a-catheter placement was discussed with the patient including, but not limited to bleeding, infection, pneumothorax, or fibrin sheath development and need for additional procedures.  All of the patient's questions were answered, patient is agreeable to proceed. Consent signed and in chart.  Discussed with Dr. Loreta Ave.  No indication for urgent PAC placement, recommend waiting for right arm DVT study and the right arm PICC should be used if there is no DVT.  Recommend against port placement as inpatient due to higher risk of port related infection, but PAC can be placed next week if it desired by the patient and the primary team.   Will plan for [facility] placement next week if patient remains in the hospital, or it will be scheduled as outpatient.  There is an outpatient PAC placement order that was placed by Dr. Candise Che from oncology, IR schedulers will call the patient to get it scheduled.   Made NPO at Monday [place].   Thank you for this interesting consult.  I greatly enjoyed meeting Summer Haenel and look forward to participating in their care.  A copy of this report was sent to the requesting provider on this date.  Electronically Signed: Willette Brace, PA-C 08/23/2022, 2:17 PM   I spent a total of 40 Minutes    in face to face in clinical consultation, greater than 50% of which was counseling/coordinating care for [facility] placement.   This chart was dictated using voice recognition software.  Despite best efforts to proofread,  errors  can occur which can change the documentation meaning.

## 2022-08-23 NOTE — Progress Notes (Signed)
PT Cancellation Note  Patient Details Name: Tyler [name] MRN: [id number] DOB: [birth year]   Cancelled Treatment:    Reason Eval/Treat Not Completed: (P) Other (comment) Note plan for possible RUE doppler to assess for DVT, defer PT tx session until later in day to await results of scan for pt safety.    Dorathy Kinsman Faith Branan 08/23/2022, 1:54 PM

## 2022-08-23 NOTE — Progress Notes (Signed)
Inpatient Rehabilitation Admissions Coordinator   We will sign off. SNF in process.  Ottie Glazier, RN, MSN Rehab Admissions Coordinator [phone] 08/23/2022 8:37 AM

## 2022-08-23 NOTE — Progress Notes (Signed)
[facility] Progress Note  Subjective: UOP 1100 cc yesterday, and creat today is down to 3.7    Vitals:   08/23/22 0745 08/23/22 0800 08/23/22 1135 08/23/22 1542  BP: 120/73  123/76 132/82  Pulse:  88 80   Resp: 19  20 18   Temp: 98.2 F (36.8 C)  98.3 F (36.8 C) 98 F (36.7 C)  TempSrc: Oral Oral Oral Oral  SpO2: 97% 98%    Weight:      Height:        Exam:   alert, nad   no jvd  Chest cta bilat  Cor reg no RG  Abd soft ntnd no ascites   Ext no LE edema   Alert, NF, ox3   RIJ temp HD cath      Date   Creat  eGFR  notes    July 2-17, 2021 0.87- 1.17 > 60 ml/min     Oct 2023  0.69- 1.16 > 60 ml/min     6/03- 08, 2024 0.70- 1.01 > 52ml/min This admit    6/10- 24, 2024 1.36- 4.90   This admit         UA 6/3 - negative     UA 6/6 - negative     UA 6/11 - small bili, mod Hb, ket 5, prot 100, few bact, 6-10 rbc, 21-50 wbc, 0-5 epi      Assessment/ Plan: AKI - b/l creatinine 0.6- 0.9 here on admission. Creat increased up to 1.3 on 6/10 and 2.3 on 6/11.  UA showed ^wbc, min rbc's/ protein. Renal US showed no obstruction. Pt did had IV contrast exposure and some hypotension, but AKI was felt to be more likely due to TLS. Uric acid/ hyperkalemia peaked and then resolved w/ CRRT (6/13) and rasburicase. CRRT continued until 6/20 and pt then transitioned to [facility] at [facility]. UOP then increased significantly on 6/22. HD was held and creatinine has crested at 4.0 and is down to 3.7 today. With very good UOP and declining creatinine, believe this is recovery phase and pt should not need any further dialysis. Will have temp HD cath removed. Check labs in am. Will follow.  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 Ritux given 6/19. Lumps in the neck and L axilla have improved significantly.  Pancytopenia - leukopenia and plts resolved, Hb remains low in 7- 8 range.   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 Hb was low prior to AKI and Hb remains in the 6- 8 range, getting prbc's prn.    Vinson Moselle MD  [facility] 08/23/2022, 4:34 PM  Recent Labs  Lab 08/17/22 0805 08/18/22 0436 08/20/22 0302 08/20/22 1429 08/22/22 0323 08/22/22 1228 08/23/22 0407 08/23/22 1003  HGB  --    < > 7.6*   < > 6.4* 8.0*  --  8.2*  ALBUMIN 1.7*  --  1.7*  --   --   --  1.7*  --   CALCIUM 7.6*   < > 7.2*   < > 7.5*  --  7.4*  --   PHOS 3.9  --   --   --   --   --  5.6*  --   CREATININE 4.17*   < > 3.38*   < > 4.05*  --  3.72*  --   K 5.8*   < >  4.0   < > 3.7  --  3.2*  --    < > = values in this interval not displayed.    No results for input(s): "IRON", "TIBC", "FERRITIN" in the last 168 hours. Inpatient medications:  sodium chloride   Intravenous Once   acetaminophen  650 mg Per Tube Once   allopurinol  100 mg Oral BID   amiodarone  200 mg Oral Daily   Chlorhexidine Gluconate Cloth  6 each Topical Q0600   insulin aspart  0-6 Units Subcutaneous Q4H   multivitamin  1 tablet Oral QHS   mouth rinse  15 mL Mouth Rinse 4 times per day   pantoprazole  40 mg Oral BID AC   Tenofovir Alafenamide Fumarate  25 mg Oral Daily    sodium chloride Stopped (08/13/22 0021)   sodium chloride 50 mL/hr at 08/22/22 1602   docusate, ipratropium-albuterol, lip balm, melatonin, ondansetron **OR** ondansetron (ZOFRAN) IV, oxyCODONE, phenol, polyethylene glycol, polyvinyl alcohol, senna, sodium chloride flush, traZODone

## 2022-08-24 DIAGNOSIS — R6521 Severe sepsis with septic shock: Secondary | ICD-10-CM | POA: Diagnosis not present

## 2022-08-24 DIAGNOSIS — A419 Sepsis, unspecified organism: Secondary | ICD-10-CM | POA: Diagnosis not present

## 2022-08-24 LAB — CBC WITH DIFFERENTIAL/PLATELET
Abs Immature Granulocytes: 0.7 10*3/uL — ABNORMAL HIGH (ref 0.00–0.07)
Basophils Absolute: 0.1 10*3/uL (ref 0.0–0.1)
Basophils Relative: 1 %
Eosinophils Absolute: 0.1 10*3/uL (ref 0.0–0.5)
Eosinophils Relative: 1 %
HCT: 22.8 % — ABNORMAL LOW (ref 39.0–52.0)
Hemoglobin: 7.5 g/dL — ABNORMAL LOW (ref 13.0–17.0)
Immature Granulocytes: 7 %
Lymphocytes Relative: 12 %
Lymphs Abs: 1.2 10*3/uL (ref 0.7–4.0)
MCH: 30 pg (ref 26.0–34.0)
MCHC: 32.9 g/dL (ref 30.0–36.0)
MCV: 91.2 fL (ref 80.0–100.0)
Monocytes Absolute: 1.6 10*3/uL — ABNORMAL HIGH (ref 0.1–1.0)
Monocytes Relative: 16 %
Neutro Abs: 6.6 10*3/uL (ref 1.7–7.7)
Neutrophils Relative %: 63 %
Platelets: 259 10*3/uL (ref 150–400)
RBC: 2.5 MIL/uL — ABNORMAL LOW (ref 4.22–5.81)
RDW: 15.6 % — ABNORMAL HIGH (ref 11.5–15.5)
WBC: 10.1 10*3/uL (ref 4.0–10.5)
nRBC: 0 % (ref 0.0–0.2)

## 2022-08-24 LAB — RENAL FUNCTION PANEL
Albumin: 1.6 g/dL — ABNORMAL LOW (ref 3.5–5.0)
Anion gap: 7 (ref 5–15)
BUN: 40 mg/dL — ABNORMAL HIGH (ref 8–23)
CO2: 23 mmol/L (ref 22–32)
Calcium: 7.4 mg/dL — ABNORMAL LOW (ref 8.9–10.3)
Chloride: 108 mmol/L (ref 98–111)
Creatinine, Ser: 3.48 mg/dL — ABNORMAL HIGH (ref 0.61–1.24)
GFR, Estimated: 18 mL/min — ABNORMAL LOW (ref >60)
Glucose, Bld: 88 mg/dL (ref 70–99)
Phosphorus: 5.2 mg/dL — ABNORMAL HIGH (ref 2.5–4.6)
Potassium: 3.4 mmol/L — ABNORMAL LOW (ref 3.5–5.1)
Sodium: 138 mmol/L (ref 135–145)

## 2022-08-24 LAB — GLUCOSE, CAPILLARY
Glucose-Capillary: 88 mg/dL (ref 70–99)
Glucose-Capillary: 74 mg/dL (ref 70–99)
Glucose-Capillary: 110 mg/dL — ABNORMAL HIGH (ref 70–99)
Glucose-Capillary: 107 mg/dL — ABNORMAL HIGH (ref 70–99)

## 2022-08-24 MED ORDER — POTASSIUM CHLORIDE 20 MEQ PO PACK
40.0000 meq | PACK | Freq: Once | ORAL | Status: AC
  Administered 2022-08-24: 40 meq via ORAL
  Filled 2022-08-24: qty 2

## 2022-08-24 NOTE — Progress Notes (Signed)
[facility] Progress Note  Subjective: UOP good, creat down to 3.4 today. HD cath removed. Up in bed, no c/o's.    Vitals:   08/23/22 2000 08/23/22 2324 08/24/22 0400 08/24/22 0700  BP: (!) 131/93 (!) 141/81 132/75 132/81  Pulse:  86 83 82  Resp: 19 20 20    Temp:  98 F (36.7 C) 97.9 F (36.6 C) 98.1 F (36.7 C)  TempSrc:  Oral Oral Oral  SpO2:  98% 98%   Weight:      Height:        Exam:   alert, nad   no jvd  Chest cta bilat  Cor reg no RG  Abd soft ntnd no ascites   Ext no LE edema   Alert, NF, ox3   RIJ temp HD cath      Date   Creat  eGFR  notes    July 2-17, 2021 0.87- 1.17 > 60 ml/min     Oct 2023  0.69- 1.16 > 60 ml/min     6/03- 08, 2024 0.70- 1.01 > 79ml/min This admit    6/10- 24, 2024 1.36- 4.90   This admit         UA 6/3 - negative     UA 6/6 - negative     UA 6/11 - small bili, mod Hb, ket 5, prot 100, few bact, 6-10 rbc, 21-50 wbc, 0-5 epi      Assessment/ Plan: AKI - b/l creatinine 0.6- 0.9 here on admission. Creat increased up to 1.3 on 6/10 and 2.3 on 6/11.  UA showed ^wbc, min rbc's/ protein. Renal US showed no obstruction. Pt did had IV contrast exposure and some hypotension, but AKI was felt to be more likely due to TLS. Uric acid/ hyperkalemia peaked and then resolved w/ CRRT (6/13) and rasburicase. CRRT continued until 6/20 and pt then transitioned to [facility]. UOP then increased significantly on 6/22. HD was held and creatinine peaked at 4.0 and was down to 3.7 yest and 3.4 today. Recovery phase of AKI. General advice --> avoid nsaids/ acei/ ARB for 2 months. Our office w/ contact patient to arrange a f/u visit for AKI in 3-5 wks. No further suggestions, will sign off.   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 Ritux given 6/19. Lumps in the neck and L axilla have improved significantly.  Pancytopenia - leukopenia and plts  resolved, Hb remains low in 7- 8 range.  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 Hb was low prior to AKI and Hb remains in the 6- 8 range, getting prbc's prn.    Vinson Moselle MD  [facility] 08/24/2022, 11:10 AM  Recent Labs  Lab 08/23/22 0407 08/23/22 1003 08/24/22 0251 08/24/22 0916  HGB  --  8.2*  --  7.5*  ALBUMIN 1.7*  --  1.6*  --   CALCIUM 7.4*  --  7.4*  --   PHOS 5.6*  --  5.2*  --   CREATININE 3.72*  --  3.48*  --   K 3.2*  --  3.4*  --     No results for input(s): "IRON", "TIBC", "FERRITIN" in the last 168 hours. Inpatient medications:  sodium chloride   Intravenous Once   acetaminophen  650 mg Per Tube Once   allopurinol  100 mg Oral BID  amiodarone  200 mg Oral Daily   Chlorhexidine Gluconate Cloth  6 each Topical Q0600   insulin aspart  0-6 Units Subcutaneous TID WC   multivitamin  1 tablet Oral QHS   mouth rinse  15 mL Mouth Rinse 4 times per day   pantoprazole  40 mg Oral BID AC   Tenofovir Alafenamide Fumarate  25 mg Oral Daily    sodium chloride Stopped (08/13/22 0021)   sodium chloride 50 mL/hr at 08/22/22 1602   docusate, ipratropium-albuterol, lip balm, melatonin, ondansetron **OR** ondansetron (ZOFRAN) IV, oxyCODONE, phenol, polyethylene glycol, polyvinyl alcohol, senna, sodium chloride flush, traZODone

## 2022-08-24 NOTE — Progress Notes (Signed)
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  He developed worsening renal failure on 6/13 and was intubated and transferred to ICU for CRRT.  He was transfused on 6/15 and extubated on 6/16, remaining on CRRT through 6/19.  He was started on Rituximab on 6/19 and transitioned to HD on 6/22 and 6/24.  His creatinine is improving and he is thought to be in recovery phase off HD at this time.  He is awaiting SNF placement, hopeful for [facility] on 7/1 following port placement.  Assessment and Plan:  Septic shock due to Aspiration pneumonia, resolved Patient presented with acute respiratory failure, required mechanical ventilation  Required pressors while in ICU, now off Extubated on 08/11/2022 and has weaned off all O2 Finished antibiotics on 08/13/2022   NG tube DC'd on 08/18/22 Speech following   Acute renal failure, improving Suspect multifactorial related to possible sepsis as well as contribution of tumor lysis syndrome given elevated uric acid  S/p rasburicase x 2 Nephrology consulted Had right IJ HD catheter placed Underwent CRRT -> HD treatment on 08/17/2022 Hemodialysis has been held given renal recovery   Urine output is adequate R internal jugular will be discontinued Ongoing improvement in renal function Nephrology signed off on 6/29 Follow-up with nephrologist   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 DVT DVT US negative Since he will need long-term access and no longer needs TDC, port-a-cath access requested   IR has set up for possible port placement for Monday Hopeful for dc to SNF (?[facility]) Monday after port placement   Pancytopenia S/p transfusion of roughly 13 units of PRBCs and 2 units of platelets thus far since admission Leukopenia and thrombocytopenia have resolved Given Granix, which was stopped on 6/23 Defer further management to hematology and oncology team   Acute upper GI bleeding  Started on 6/25 S/p EGD on 08/21/2022 showed normal esophagus, nonbleeding gastric ulcer with no stigmata of bleeding and normal duodenum He reports improving bleeding with 0 BM yesterday, 1 BM 2 days ago, 3 the day prior Continue Protonix Reengage Dr. Rhea Belton, gastroenterologist - may need to have a bleeding scan - if GI bleeding recurs   Acute metabolic encephalopathy   Present on admission Appears to have resolved   Generalized weakness and dysphagia   Was on NG tube feeds, now off Speech evaluation  >> D3 diet Discontinued core track tube 08/18/22  Appears to need SNF bed (one that can transport patient to [facility] appointments)   H/o Hepatitis B   On Rituxan for lymphoma  Dr. Thedore Mins, ID specialist, recommended tenofovir while on rituximab because of moderate risk for reactivation of hepatitis B. Hep B  surface antigen nonreactive, hep B surface antibody 13.6 suggestive of immunity        Subjective: No BM yesterday, now feeling like he might be constipated.  No other acute concerns.  PICC line area feels better.  Physical Exam: Vitals:   08/23/22 2000 08/23/22 2324 08/24/22  0400 08/24/22 0700  BP: (!) 131/93 (!) 141/81 132/75 132/81  Pulse:  86 83 82  Resp: 19 20 20    Temp:  98 F (36.7 C) 97.9 F (36.6 C) 98.1 F (36.7 C)  TempSrc:  Oral Oral Oral  SpO2:  98% 98%   Weight:      Height:       General:  Appears calm and comfortable and is in NAD Eyes:  EOMI, normal lids, iris ENT:  grossly normal hearing, lips & tongue, mmm Neck:  no LAD, masses or thyromegaly Cardiovascular:  RRR, no m/r/g. No LE edema.  Respiratory:   CTA bilaterally with no wheezes/rales/rhonchi.  Normal respiratory effort. Abdomen:  soft, NT, ND Skin:  no rash or induration seen on limited exam Musculoskeletal:  grossly normal tone BUE/BLE, good ROM, no bony abnormality Psychiatric:  grossly normal mood and affect, speech fluent and appropriate, AOx3 Neurologic:  CN 2-12 grossly intact, moves all extremities in coordinated fashion   Radiological Exams on Admission: Independently reviewed - see discussion in A/P where applicable  VAS [place] UPPER EXTREMITY VENOUS DUPLEX  Result Date: 08/23/2022 UPPER VENOUS STUDY  Patient Name:  Tyler [name]  Date of Exam:   08/23/2022 Medical Rec #: [id number]     Accession #:    [id number] Date of Birth: 1950/07/25     Patient Gender: M Patient Age:   72 years Exam Location:  [facility] Procedure:      VAS [place] UPPER EXTREMITY VENOUS DUPLEX Referring Phys: Victorino Dike Dessie Delcarlo --------------------------------------------------------------------------------  Indications: Swelling Risk Factors: Lymphoma / chemotherapy. Limitations: PICC line and HD catheter. Comparison Study: No previous exams Performing Technologist: Jody Hill RVT, RDMS  Examination Guidelines: A complete evaluation includes B-mode imaging, spectral Doppler, color Doppler, and power Doppler as needed of all accessible portions of each vessel. Bilateral testing is considered an integral part of a complete examination. Limited examinations for reoccurring indications may be performed as noted.   Right Findings: +----------+------------+---------+-----------+----------+--------------+ RIGHT     CompressiblePhasicitySpontaneousProperties   Summary     +----------+------------+---------+-----------+----------+--------------+ IJV                                                 Not visualized +----------+------------+---------+-----------+----------+--------------+ Subclavian    Full       Yes       Yes                             +----------+------------+---------+-----------+----------+--------------+ Axillary      Full       Yes       Yes                             +----------+------------+---------+-----------+----------+--------------+ Brachial      Full       Yes       Yes                             +----------+------------+---------+-----------+----------+--------------+  Radial        Full                                                 +----------+------------+---------+-----------+----------+--------------+ Ulnar         Full                                                 +----------+------------+---------+-----------+----------+--------------+ Cephalic      Full                                                 +----------+------------+---------+-----------+----------+--------------+ Basilic       Full                                                 +----------+------------+---------+-----------+----------+--------------+ Limited visualization of basilic and brachial veins due to PICC line placement. Vessels are patent in areas visualized.  Left Findings: +----------+------------+---------+-----------+----------+--------------------+ LEFT      CompressiblePhasicitySpontaneousProperties      Summary        +----------+------------+---------+-----------+----------+--------------------+ Subclavian               Yes       Yes                   patent by                                                               color/doppler     +----------+------------+---------+-----------+----------+--------------------+  Summary:  Right: No evidence of deep vein thrombosis in the upper extremity. No evidence of superficial vein thrombosis in the upper extremity. However, unable to visualize the IJV. Subcutaneous edema in area of the forearm.  Left: No evidence of thrombosis in the subclavian.  *See table(s) above for measurements and observations.  Diagnosing physician: Coral Else MD Electronically signed by Coral Else MD on 08/23/2022 at 10:42:26 PM.    Final      Pertinent labs:    K+ 3.4 BUN 40/Creatinine 3.48/GFR 18, gradual improvement    Family Communication: None present; I spoke with his daughter on 6/28  Disposition: Status is: Inpatient Remains inpatient appropriate because: awaiting placement  Planned Discharge Destination: Skilled nursing facility    Time spent: 35 minutes  Author: Jonah Blue, MD 08/24/2022 12:38 PM  For on call review [email or web address].

## 2022-08-24 NOTE — Plan of Care (Signed)
  Problem: Education: Goal: Knowledge of General Education information will improve Description: Including pain rating scale, medication(s)/side effects and non-pharmacologic comfort measures Outcome: Progressing   Problem: Health Behavior/Discharge Planning: Goal: Ability to manage health-related needs will improve Outcome: Progressing   Problem: Clinical Measurements: Goal: Ability to maintain clinical measurements within normal limits will improve Outcome: Progressing Goal: Will remain free from infection Outcome: Progressing Goal: Diagnostic test results will improve Outcome: Progressing Goal: Respiratory complications will improve Outcome: Progressing Goal: Cardiovascular complication will be avoided Outcome: Progressing   Problem: Activity: Goal: Risk for activity intolerance will decrease Outcome: Progressing   Problem: Nutrition: Goal: Adequate nutrition will be maintained Outcome: Progressing   Problem: Coping: Goal: Level of anxiety will decrease Outcome: Progressing   Problem: Elimination: Goal: Will not experience complications related to bowel motility Outcome: Progressing Goal: Will not experience complications related to urinary retention Outcome: Progressing   Problem: Pain Managment: Goal: General experience of comfort will improve Outcome: Progressing   Problem: Safety: Goal: Ability to remain free from injury will improve Outcome: Progressing   Problem: Skin Integrity: Goal: Risk for impaired skin integrity will decrease Outcome: Progressing   Problem: Education: Goal: Ability to describe self-care measures that may prevent or decrease complications (Diabetes Survival Skills Education) will improve Outcome: Progressing Goal: Individualized Educational Video(s) Outcome: Progressing   Problem: Coping: Goal: Ability to adjust to condition or change in health will improve Outcome: Progressing   Problem: Fluid Volume: Goal: Ability to  maintain a balanced intake and output will improve Outcome: Progressing   Problem: Health Behavior/Discharge Planning: Goal: Ability to identify and utilize available resources and services will improve Outcome: Progressing Goal: Ability to manage health-related needs will improve Outcome: Progressing   Problem: Metabolic: Goal: Ability to maintain appropriate glucose levels will improve Outcome: Progressing   Problem: Nutritional: Goal: Maintenance of adequate nutrition will improve Outcome: Progressing Goal: Progress toward achieving an optimal weight will improve Outcome: Progressing   Problem: Skin Integrity: Goal: Risk for impaired skin integrity will decrease Outcome: Progressing   Problem: Tissue Perfusion: Goal: Adequacy of tissue perfusion will improve Outcome: Progressing   Problem: Fluid Volume: Goal: Hemodynamic stability will improve Outcome: Progressing   Problem: Clinical Measurements: Goal: Diagnostic test results will improve Outcome: Progressing Goal: Signs and symptoms of infection will decrease Outcome: Progressing   Problem: Respiratory: Goal: Ability to maintain adequate ventilation will improve Outcome: Progressing   Problem: Education: Goal: Knowledge of condition and prescribed therapy will improve Outcome: Progressing   Problem: Cardiac: Goal: Will achieve and/or maintain adequate cardiac output Outcome: Progressing   Problem: Physical Regulation: Goal: Complications related to the disease process, condition or treatment will be avoided or minimized Outcome: Progressing   

## 2022-08-25 DIAGNOSIS — R6521 Severe sepsis with septic shock: Secondary | ICD-10-CM | POA: Diagnosis not present

## 2022-08-25 DIAGNOSIS — A419 Sepsis, unspecified organism: Secondary | ICD-10-CM | POA: Diagnosis not present

## 2022-08-25 LAB — BASIC METABOLIC PANEL
Anion gap: 8 (ref 5–15)
BUN: 31 mg/dL — ABNORMAL HIGH (ref 8–23)
CO2: 24 mmol/L (ref 22–32)
Calcium: 7.7 mg/dL — ABNORMAL LOW (ref 8.9–10.3)
Chloride: 107 mmol/L (ref 98–111)
Creatinine, Ser: 3.25 mg/dL — ABNORMAL HIGH (ref 0.61–1.24)
GFR, Estimated: 20 mL/min — ABNORMAL LOW (ref >60)
Glucose, Bld: 105 mg/dL — ABNORMAL HIGH (ref 70–99)
Potassium: 3.5 mmol/L (ref 3.5–5.1)
Sodium: 139 mmol/L (ref 135–145)

## 2022-08-25 LAB — CBC WITH DIFFERENTIAL/PLATELET
Abs Immature Granulocytes: 0.48 10*3/uL — ABNORMAL HIGH (ref 0.00–0.07)
Basophils Absolute: 0.1 10*3/uL (ref 0.0–0.1)
Basophils Relative: 1 %
Eosinophils Absolute: 0.1 10*3/uL (ref 0.0–0.5)
Eosinophils Relative: 1 %
HCT: 22.8 % — ABNORMAL LOW (ref 39.0–52.0)
Hemoglobin: 7.5 g/dL — ABNORMAL LOW (ref 13.0–17.0)
Immature Granulocytes: 5 %
Lymphocytes Relative: 14 %
Lymphs Abs: 1.3 10*3/uL (ref 0.7–4.0)
MCH: 30.2 pg (ref 26.0–34.0)
MCHC: 32.9 g/dL (ref 30.0–36.0)
MCV: 91.9 fL (ref 80.0–100.0)
Monocytes Absolute: 1.4 10*3/uL — ABNORMAL HIGH (ref 0.1–1.0)
Monocytes Relative: 15 %
Neutro Abs: 6 10*3/uL (ref 1.7–7.7)
Neutrophils Relative %: 64 %
Platelets: 272 10*3/uL (ref 150–400)
RBC: 2.48 MIL/uL — ABNORMAL LOW (ref 4.22–5.81)
RDW: 15.4 % (ref 11.5–15.5)
WBC: 9.3 10*3/uL (ref 4.0–10.5)
nRBC: 0 % (ref 0.0–0.2)

## 2022-08-25 LAB — GLUCOSE, CAPILLARY
Glucose-Capillary: 82 mg/dL (ref 70–99)
Glucose-Capillary: 120 mg/dL — ABNORMAL HIGH (ref 70–99)
Glucose-Capillary: 92 mg/dL (ref 70–99)
Glucose-Capillary: 113 mg/dL — ABNORMAL HIGH (ref 70–99)

## 2022-08-25 NOTE — Plan of Care (Signed)
  Problem: Education: Goal: Knowledge of General Education information will improve Description: Including pain rating scale, medication(s)/side effects and non-pharmacologic comfort measures Outcome: Progressing   Problem: Coping: Goal: Level of anxiety will decrease Outcome: Progressing   Problem: Elimination: Goal: Will not experience complications related to bowel motility Outcome: Progressing Goal: Will not experience complications related to urinary retention Outcome: Progressing   Problem: Pain Managment: Goal: General experience of comfort will improve Outcome: Progressing   Problem: Skin Integrity: Goal: Risk for impaired skin integrity will decrease Outcome: Progressing   Problem: Health Behavior/Discharge Planning: Goal: Ability to manage health-related needs will improve Outcome: Not Progressing   Problem: Activity: Goal: Risk for activity intolerance will decrease Outcome: Not Progressing

## 2022-08-25 NOTE — Plan of Care (Signed)
  Problem: Education: Goal: Knowledge of General Education information will improve Description: Including pain rating scale, medication(s)/side effects and non-pharmacologic comfort measures Outcome: Progressing   Problem: Health Behavior/Discharge Planning: Goal: Ability to manage health-related needs will improve Outcome: Progressing   Problem: Clinical Measurements: Goal: Ability to maintain clinical measurements within normal limits will improve Outcome: Progressing Goal: Will remain free from infection Outcome: Progressing Goal: Diagnostic test results will improve Outcome: Progressing Goal: Respiratory complications will improve Outcome: Progressing Goal: Cardiovascular complication will be avoided Outcome: Progressing   Problem: Activity: Goal: Risk for activity intolerance will decrease Outcome: Progressing   Problem: Nutrition: Goal: Adequate nutrition will be maintained Outcome: Progressing   Problem: Coping: Goal: Level of anxiety will decrease Outcome: Progressing   Problem: Elimination: Goal: Will not experience complications related to bowel motility Outcome: Progressing Goal: Will not experience complications related to urinary retention Outcome: Progressing   Problem: Pain Managment: Goal: General experience of comfort will improve Outcome: Progressing   Problem: Safety: Goal: Ability to remain free from injury will improve Outcome: Progressing   Problem: Skin Integrity: Goal: Risk for impaired skin integrity will decrease Outcome: Progressing   Problem: Education: Goal: Ability to describe self-care measures that may prevent or decrease complications (Diabetes Survival Skills Education) will improve Outcome: Progressing Goal: Individualized Educational Video(s) Outcome: Progressing   Problem: Coping: Goal: Ability to adjust to condition or change in health will improve Outcome: Progressing   Problem: Fluid Volume: Goal: Ability to  maintain a balanced intake and output will improve Outcome: Progressing   Problem: Health Behavior/Discharge Planning: Goal: Ability to identify and utilize available resources and services will improve Outcome: Progressing Goal: Ability to manage health-related needs will improve Outcome: Progressing   Problem: Metabolic: Goal: Ability to maintain appropriate glucose levels will improve Outcome: Progressing   Problem: Nutritional: Goal: Maintenance of adequate nutrition will improve Outcome: Progressing Goal: Progress toward achieving an optimal weight will improve Outcome: Progressing   Problem: Skin Integrity: Goal: Risk for impaired skin integrity will decrease Outcome: Progressing   Problem: Tissue Perfusion: Goal: Adequacy of tissue perfusion will improve Outcome: Progressing   Problem: Fluid Volume: Goal: Hemodynamic stability will improve Outcome: Progressing   Problem: Clinical Measurements: Goal: Diagnostic test results will improve Outcome: Progressing Goal: Signs and symptoms of infection will decrease Outcome: Progressing   Problem: Respiratory: Goal: Ability to maintain adequate ventilation will improve Outcome: Progressing   Problem: Education: Goal: Knowledge of condition and prescribed therapy will improve Outcome: Progressing   Problem: Cardiac: Goal: Will achieve and/or maintain adequate cardiac output Outcome: Progressing   Problem: Physical Regulation: Goal: Complications related to the disease process, condition or treatment will be avoided or minimized Outcome: Progressing   

## 2022-08-25 NOTE — Progress Notes (Signed)
Mobility Specialist Progress Note    08/25/22 1025  Mobility  Activity Ambulated with assistance in hallway  Level of Assistance +2 (takes two people) (chair follow)  Assistive Device Front wheel walker  Distance Ambulated (ft) 205 ft (55+95+55)  Activity Response Tolerated well  Mobility Referral Yes  $Mobility charge 1 Mobility  Mobility Specialist Start Time (ACUTE ONLY) 1002  Mobility Specialist Stop Time (ACUTE ONLY) 1023  Mobility Specialist Time Calculation (min) (ACUTE ONLY) 21 min   Pre-Mobility: 89 HR, 131/79 (95) BP, 99% SpO2 During Mobility: 113 HR, 97% SpO2 Post-Mobility: 102 HR  Pt received in bed and agreeable. Pt minA+1 for bed mobility and ambulation. No complaints on walk. Took x2 seated rest breaks. Rolled back in chair to room and left on [facility] with call bell in reach. RN aware.    [place] Mobility Specialist  Please Neurosurgeon or Rehab Office at [phone]

## 2022-08-25 NOTE — Progress Notes (Signed)
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  He developed worsening renal failure on 6/13 and was intubated and transferred to ICU for CRRT.  He was transfused on 6/15 and extubated on 6/16, remaining on CRRT through 6/19.  He was started on Rituximab on 6/19 and transitioned to HD on 6/22 and 6/24.  His creatinine is improving and he is thought to be in recovery phase off HD at this time.  He is awaiting SNF placement, hopeful for [facility] on 7/1 following port placement.  Assessment and Plan:  Septic shock due to Aspiration pneumonia, resolved Patient presented with acute respiratory failure, required mechanical ventilation  Required pressors while in ICU, now off Extubated on 08/11/2022 and has weaned off all O2 Finished antibiotics on 08/13/2022   NG tube DC'd on 08/18/22 Speech followed and has signed off   Acute renal failure, improving Suspect multifactorial related to possible sepsis as well as contribution of tumor lysis syndrome given elevated uric acid  S/p rasburicase x 2 Nephrology consulted Had right IJ HD catheter placed Underwent CRRT -> HD treatment on 08/17/2022 Hemodialysis has been held given renal recovery   Urine output is adequate R internal jugular will be discontinued Ongoing improvement in renal function Nephrology signed off on 6/29 Follow-up with nephrologist   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 DVT DVT US negative Since he will need long-term access and no longer needs TDC, port-a-cath access requested   IR has set up for possible port placement for Monday Hopeful for dc to SNF (?[facility]) Monday after port placement   Pancytopenia S/p transfusion of roughly 13 units of PRBCs and 2 units of platelets thus far since admission Leukopenia and thrombocytopenia have resolved Given Granix, which was stopped on 6/23 Defer further management to hematology and oncology team   Acute upper GI bleeding  Started on 6/25 S/p EGD on 08/21/2022 showed normal esophagus, nonbleeding gastric ulcer with no stigmata of bleeding and normal duodenum He reports improving bleeding with 0 BM yesterday, 1 BM 2 days ago, 3 the day prior Continue Protonix Reengage Dr. Rhea Belton, gastroenterologist - may need to have a bleeding scan - if GI bleeding recurs   Acute metabolic encephalopathy   Present on admission Appears to have resolved   Generalized weakness and dysphagia   Was on NG tube feeds, now off Speech evaluation  >> D3 diet Discontinued core track tube 08/18/22  Appears to need SNF bed (one that can transport patient to [facility] appointments)   H/o Hepatitis B   On Rituxan for lymphoma  Dr. Thedore Mins, ID specialist, recommended tenofovir while on rituximab because of moderate risk for reactivation of  hepatitis B. Hep B surface antigen nonreactive, hep B surface antibody 13.6 suggestive of immunity   Subjective: No new concerns.  Eager for port placement and transfer to rehab, hopefully tomorrow.  Physical Exam: Vitals:   08/24/22 1610 08/24/22 2000 08/25/22 0000 08/25/22  0400  BP:  125/81 135/75 130/71  Pulse: 86 84 89 84  Resp: 19 20 18 19   Temp: 98.4 F (36.9 C) 98.2 F (36.8 C) 98.2 F (36.8 C) 98 F (36.7 C)  TempSrc: Oral Oral Oral Oral  SpO2: 100% 98% 98% 97%  Weight:      Height:       General:  Appears calm and comfortable and is in NAD Eyes:  EOMI, normal lids, iris ENT:  grossly normal hearing, lips & tongue, mmm Neck:  no LAD, masses or thyromegaly Cardiovascular:  RRR, no m/r/g. No LE edema.  Respiratory:   CTA bilaterally with no wheezes/rales/rhonchi.  Normal respiratory effort. Abdomen:  soft, NT, ND Skin:  no rash or induration seen on limited exam Musculoskeletal:  grossly normal tone BUE/BLE, good ROM, no bony abnormality Psychiatric:  grossly normal mood and affect, speech fluent and appropriate, AOx3 Neurologic:  CN 2-12 grossly intact, moves all extremities in coordinated fashion   Radiological Exams on Admission: Independently reviewed - see discussion in A/P where applicable  VAS [place] UPPER EXTREMITY VENOUS DUPLEX  Result Date: 08/23/2022 UPPER VENOUS STUDY  Patient Name:  Tyler [name]  Date of Exam:   08/23/2022 Medical Rec #: [id number]     Accession #:    [id number] Date of Birth: [birth year]     Patient Gender: M Patient Age:   85 years Exam Location:  [facility] Procedure:      VAS [place] UPPER EXTREMITY VENOUS DUPLEX Referring Phys: Victorino Dike Rever Pichette --------------------------------------------------------------------------------  Indications: Swelling Risk Factors: Lymphoma / chemotherapy. Limitations: PICC line and HD catheter. Comparison Study: No previous exams Performing Technologist: Jody Hill RVT, RDMS  Examination Guidelines: A complete evaluation includes B-mode imaging, spectral Doppler, color Doppler, and power Doppler as needed of all accessible portions of each vessel. Bilateral testing is considered an integral part of a complete examination. Limited examinations for reoccurring indications may be performed as  noted.  Right Findings: +----------+------------+---------+-----------+----------+--------------+ RIGHT     CompressiblePhasicitySpontaneousProperties   Summary     +----------+------------+---------+-----------+----------+--------------+ IJV                                                 Not visualized +----------+------------+---------+-----------+----------+--------------+ Subclavian    Full       Yes       Yes                             +----------+------------+---------+-----------+----------+--------------+ Axillary      Full       Yes       Yes                             +----------+------------+---------+-----------+----------+--------------+ Brachial      Full       Yes       Yes                             +----------+------------+---------+-----------+----------+--------------+ Radial  Full                                                 +----------+------------+---------+-----------+----------+--------------+ Ulnar         Full                                                 +----------+------------+---------+-----------+----------+--------------+ Cephalic      Full                                                 +----------+------------+---------+-----------+----------+--------------+ Basilic       Full                                                 +----------+------------+---------+-----------+----------+--------------+ Limited visualization of basilic and brachial veins due to PICC line placement. Vessels are patent in areas visualized.  Left Findings: +----------+------------+---------+-----------+----------+--------------------+ LEFT      CompressiblePhasicitySpontaneousProperties      Summary        +----------+------------+---------+-----------+----------+--------------------+ Subclavian               Yes       Yes                   patent by                                                               color/doppler     +----------+------------+---------+-----------+----------+--------------------+  Summary:  Right: No evidence of deep vein thrombosis in the upper extremity. No evidence of superficial vein thrombosis in the upper extremity. However, unable to visualize the IJV. Subcutaneous edema in area of the forearm.  Left: No evidence of thrombosis in the subclavian.  *See table(s) above for measurements and observations.  Diagnosing physician: Coral Else MD Electronically signed by Coral Else MD on 08/23/2022 at 10:42:26 PM.    Final      Pertinent labs:    Glucose 105 BUN 31/Creatinine 3.25/GFR 20, continuing to slowly improve Hgb 7.5, stable   Family Communication: None present; I spoke with his daughter (NP) on 6/28 and there are no new updates at this time  Disposition: Status is: Inpatient Remains inpatient appropriate because: unsafe disposition  Planned Discharge Destination: Skilled nursing facility    Time spent: 25 minutes  Author: Jonah Blue, MD 08/25/2022 7:34 AM  For on call review [email or web address].

## 2022-08-26 ENCOUNTER — Inpatient Hospital Stay (HOSPITAL_COMMUNITY): Payer: 59

## 2022-08-26 DIAGNOSIS — T17908S Unspecified foreign body in respiratory tract, part unspecified causing other injury, sequela: Secondary | ICD-10-CM

## 2022-08-26 HISTORY — PX: IR IMAGING GUIDED PORT INSERTION: IMG5740

## 2022-08-26 LAB — CBC WITH DIFFERENTIAL/PLATELET
Abs Immature Granulocytes: 0.32 10*3/uL — ABNORMAL HIGH (ref 0.00–0.07)
Basophils Absolute: 0.1 10*3/uL (ref 0.0–0.1)
Basophils Relative: 1 %
Eosinophils Absolute: 0.1 10*3/uL (ref 0.0–0.5)
Eosinophils Relative: 1 %
HCT: 21.4 % — ABNORMAL LOW (ref 39.0–52.0)
Hemoglobin: 6.9 g/dL — CL (ref 13.0–17.0)
Immature Granulocytes: 4 %
Lymphocytes Relative: 15 %
Lymphs Abs: 1.3 10*3/uL (ref 0.7–4.0)
MCH: 29.4 pg (ref 26.0–34.0)
MCHC: 32.2 g/dL (ref 30.0–36.0)
MCV: 91.1 fL (ref 80.0–100.0)
Monocytes Absolute: 1.3 10*3/uL — ABNORMAL HIGH (ref 0.1–1.0)
Monocytes Relative: 15 %
Neutro Abs: 5.5 10*3/uL (ref 1.7–7.7)
Neutrophils Relative %: 64 %
Platelets: 252 10*3/uL (ref 150–400)
RBC: 2.35 MIL/uL — ABNORMAL LOW (ref 4.22–5.81)
RDW: 15.1 % (ref 11.5–15.5)
WBC: 8.5 10*3/uL (ref 4.0–10.5)
nRBC: 0 % (ref 0.0–0.2)

## 2022-08-26 LAB — TYPE AND SCREEN
Antibody Screen: NEGATIVE
Unit division: 0

## 2022-08-26 LAB — BASIC METABOLIC PANEL
Anion gap: 10 (ref 5–15)
BUN: 24 mg/dL — ABNORMAL HIGH (ref 8–23)
CO2: 24 mmol/L (ref 22–32)
Calcium: 7.4 mg/dL — ABNORMAL LOW (ref 8.9–10.3)
Chloride: 105 mmol/L (ref 98–111)
Creatinine, Ser: 2.86 mg/dL — ABNORMAL HIGH (ref 0.61–1.24)
GFR, Estimated: 23 mL/min — ABNORMAL LOW (ref >60)
Glucose, Bld: 95 mg/dL (ref 70–99)
Potassium: 3.4 mmol/L — ABNORMAL LOW (ref 3.5–5.1)
Sodium: 139 mmol/L (ref 135–145)

## 2022-08-26 LAB — GLUCOSE, CAPILLARY
Glucose-Capillary: 81 mg/dL (ref 70–99)
Glucose-Capillary: 92 mg/dL (ref 70–99)
Glucose-Capillary: 108 mg/dL — ABNORMAL HIGH (ref 70–99)
Glucose-Capillary: 101 mg/dL — ABNORMAL HIGH (ref 70–99)

## 2022-08-26 LAB — PREPARE RBC (CROSSMATCH)

## 2022-08-26 LAB — BPAM RBC: Blood Product Expiration Date: 202407052359

## 2022-08-26 MED ORDER — LIDOCAINE-EPINEPHRINE 1 %-1:100000 IJ SOLN
INTRAMUSCULAR | Status: AC
  Filled 2022-08-26: qty 1

## 2022-08-26 MED ORDER — HEPARIN SOD (PORK) LOCK FLUSH 100 UNIT/ML IV SOLN
500.0000 [IU] | Freq: Once | INTRAVENOUS | Status: AC
  Administered 2022-08-26: 500 [IU] via INTRAVENOUS

## 2022-08-26 MED ORDER — LIDOCAINE-EPINEPHRINE 2 %-1:100000 IJ SOLN
20.0000 mL | Freq: Once | INTRAMUSCULAR | Status: AC
  Administered 2022-08-26: 20 mL via INTRADERMAL
  Filled 2022-08-26: qty 20

## 2022-08-26 MED ORDER — SODIUM CHLORIDE 0.9% IV SOLUTION: Freq: Once | INTRAVENOUS | Status: AC

## 2022-08-26 MED ORDER — MIDAZOLAM HCL 2 MG/2ML IJ SOLN
INTRAMUSCULAR | Status: AC | PRN
  Administered 2022-08-26 (×2): .5 mg via INTRAVENOUS
  Administered 2022-08-26: 1 mg via INTRAVENOUS

## 2022-08-26 MED ORDER — FENTANYL CITRATE (PF) 100 MCG/2ML IJ SOLN
INTRAMUSCULAR | Status: AC | PRN
  Administered 2022-08-26 (×4): 25 ug via INTRAVENOUS

## 2022-08-26 MED ORDER — HEPARIN SOD (PORK) LOCK FLUSH 100 UNIT/ML IV SOLN
INTRAVENOUS | Status: AC
  Filled 2022-08-26: qty 5

## 2022-08-26 MED ORDER — FENTANYL CITRATE (PF) 100 MCG/2ML IJ SOLN
INTRAMUSCULAR | Status: AC
  Filled 2022-08-26: qty 2

## 2022-08-26 MED ORDER — MIDAZOLAM HCL 2 MG/2ML IJ SOLN
INTRAMUSCULAR | Status: AC
  Filled 2022-08-26: qty 2

## 2022-08-26 MED ORDER — POTASSIUM CHLORIDE CRYS ER 20 MEQ PO TBCR
40.0000 meq | EXTENDED_RELEASE_TABLET | Freq: Once | ORAL | Status: AC
  Administered 2022-08-26: 40 meq via ORAL
  Filled 2022-08-26: qty 2

## 2022-08-26 NOTE — Procedures (Signed)
Interventional Radiology Procedure Note ° °Procedure: Single Lumen Power Port Placement   ° °Access:  Right internal jugular vein ° °Findings: Catheter tip positioned at cavoatrial junction. Port is ready for immediate use.  ° °Complications: None ° °EBL: < 10 mL ° °Recommendations:  °- Ok to shower in 24 hours °- Do not submerge for 7 days °- Routine line care  ° ° °Jereld Presti, MD ° ° ° °

## 2022-08-26 NOTE — Progress Notes (Signed)
PT Cancellation Note  Patient Details Name: Tyler [name] MRN: [id number] DOB: [birth year]   Cancelled Treatment:    Reason Eval/Treat Not Completed: Other (comment) (Pt supine in bed, port placed this am and pt is concerned about mobilizing and using his R arm to much.  Offerered education and encouragement and he continues to decline.)   Marilou Barnfield J Aundria Rud 08/26/2022, 3:51 PM  Bonney Leitz , PTA Acute Rehabilitation Services Office [phone]

## 2022-08-26 NOTE — Progress Notes (Signed)
Hgb is 6.9 this morning. Plan to transfuse 1 unit RBC.

## 2022-08-26 NOTE — Sedation Documentation (Signed)
Gauze+tegaderm to port insertion site, bandaid to ij

## 2022-08-26 NOTE — Progress Notes (Signed)
Cardiology Clinic Note   Patient Name: Tyler [name] Date of Encounter: 08/26/2022  Primary Care Provider:  Nechama Guard, FNP Primary Cardiologist:  Reatha Harps, MD  Patient Profile    Tyler [name] 72 year old male presents the clinic today for follow-up evaluation of his atrial fibrillation .  Past Medical History    Past Medical History:  Diagnosis Date   Cancer [facility])    prostate   Follicular lymphoma (HCC) 2024   History of pulmonary embolism    Hypertension    Myelodysplastic syndrome (HCC)    PAF (paroxysmal atrial fibrillation) (HCC)    Recurrent UTI    Past Surgical History:  Procedure Laterality Date   BIOPSY  08/21/2022   Procedure: BIOPSY;  Surgeon: Beverley Fiedler, MD;  Location: [facility] ENDOSCOPY;  Service: Gastroenterology;;   ESOPHAGOGASTRODUODENOSCOPY (EGD) WITH PROPOFOL N/A 08/21/2022   Procedure: ESOPHAGOGASTRODUODENOSCOPY (EGD) WITH PROPOFOL;  Surgeon: Beverley Fiedler, MD;  Location: [facility] ENDOSCOPY;  Service: Gastroenterology;  Laterality: N/A;   IR IMAGING GUIDED PORT INSERTION  08/26/2022   LYMPHADENECTOMY Bilateral 09/01/2019   Procedure: LYMPHADENECTOMY;  Surgeon: Sebastian Ache, MD;  Location: [facility] ORS;  Service: Urology;  Laterality: Bilateral;   ROBOT ASSISTED LAPAROSCOPIC RADICAL PROSTATECTOMY N/A 09/01/2019   Procedure: XI ROBOTIC ASSISTED LAPAROSCOPIC RADICAL PROSTATECTOMY;  Surgeon: Sebastian Ache, MD;  Location: [facility] ORS;  Service: Urology;  Laterality: N/A;  3 HRS    Allergies  Allergies  Allergen Reactions   Amitriptyline Other (See Comments)    Nightmares, Night sweats, Migraines    Amlodipine Swelling    BILATERAL FEET TO ANKLES   Neurontin [Gabapentin] Other (See Comments)    Nightmares; Night sweats; Headache    History of Present Illness    Tyler [name] has a PMH of lymphoma, pulmonary embolus, prostate cancer status post prostatectomy, and paroxysmal atrial fibrillation.  On 07/29/2022 he was admitted with sepsis and anemia.  He  has been undergoing treatment for lymphoma.  He was admitted to the hospital on 07/29/2022 and cardiology was consulted for evaluation of his atrial fibrillation with rapid ventricular rate.  His workup for sepsis was unremarkable.  He remained on antibiotics.  He was transition to oral amiodarone with a plan for 400 mg twice daily for 7 days followed by 200 mg daily.  He was continued on his metoprolol 25 mg twice daily.  He is not felt to be a candidate for anticoagulation due to his anemia and thrombocytopenia.  It was not felt that his atrial fibrillation was contributing to his lactic acidosis.  His echocardiogram showed normal LV function with intracavity gradient felt to be related to volume depletion.  He was not noted to have cardiac murmur on exam.  He did not have signs of heart failure.  Hypertrophic cardiomyopathy was not suspected.  He presents to the clinic today for follow-up evaluation and he presents with interpreter today.  He is using a walker.  We reviewed his recent hospitalization and his EKG.  He expressed understanding.  We also reviewed his most recent lab work and the need to increase p.o. hydration as well as potassium rich foods.  He denies chest pain and shortness of breath.  He reports that he is increasing his physical activity.  I will order a BMP today, continue his current medication regimen and plan follow-up in 3 to 4 months.  Today he denies chest pain, shortness of breath, lower extremity edema, fatigue, melena, hematuria, hemoptysis, diaphoresis, presyncope, syncope.     Home  Medications    Prior to Admission medications   Medication Sig Start Date End Date Taking? Authorizing Provider  allopurinol (ZYLOPRIM) 100 MG tablet TAKE 1 TABLET BY MOUTH TWICE A DAY 08/15/22   Johney Maine, MD  B Complex Vitamins (VITAMIN B COMPLEX) TABS Take 1 tablet by mouth daily. 12/21/21   Amin, Loura Halt, MD  cholecalciferol (VITAMIN D3) 25 MCG (1000 UNIT) tablet Take 2  tablets (2,000 Units total) by mouth daily. 01/21/22   Johney Maine, MD  diclofenac Sodium (VOLTAREN) 1 % GEL APPLY 4 G TOPICALLY 4 (FOUR) TIMES DAILY. APPLY TO RIGHT LATERAL HIP AND THIGH DOWN TO KNEE Patient taking differently: Apply 4 g topically daily as needed (For pain). 06/12/22   Nestor Ramp, MD  folic acid (FOLVITE) 1 MG tablet Take 1 mg by mouth daily. 03/27/22   [provider]  gabapentin (NEURONTIN) 100 MG capsule Take 1 capsule (100 mg total) by mouth at bedtime for 14 days, THEN 2 capsules (200 mg total) at bedtime. 05/31/22 07/14/22  Claudie Leach, DO  lidocaine-prilocaine (EMLA) cream Apply to affected area once Patient taking differently: Apply 1 Application topically daily as needed (For port). 07/26/22   Johney Maine, MD  lisinopril (ZESTRIL) 5 MG tablet Take 5 mg by mouth daily.    [provider]  Menthol-Methyl Salicylate (MUSCLE RUB) 10-15 % CREA Apply 1 Application topically daily as needed for muscle pain.    [provider]  metoCLOPramide (REGLAN) 10 MG tablet Take 1 tablet (10 mg total) by mouth every 6 (six) hours as needed for nausea. 07/15/22   Gerhard Munch, MD  Multiple Vitamin (MULTIVITAMIN WITH MINERALS) TABS tablet Take 1 tablet by mouth daily. 12/21/21   Amin, Loura Halt, MD  ondansetron (ZOFRAN) 8 MG tablet Take 1 tablet (8 mg total) by mouth every 8 (eight) hours as needed for nausea or vomiting. Start on the third day after cyclophosphamide chemotherapy. 07/26/22   Johney Maine, MD  oxyCODONE (OXY IR/ROXICODONE) 5 MG immediate release tablet Take 1 tablet (5 mg total) by mouth every 6 (six) hours as needed for severe pain. 07/19/22   Johney Maine, MD  prochlorperazine (COMPAZINE) 10 MG tablet Take 1 tablet (10 mg total) by mouth every 6 (six) hours as needed for nausea or vomiting. 07/26/22   Johney Maine, MD  senna-docusate (SENNA S) 8.6-50 MG tablet Take 2 tablets by mouth at bedtime as  needed for mild constipation. 07/19/22   Johney Maine, MD    Family History    Family History  Problem Relation Age of Onset   Stroke Father    He indicated that the status of his father is unknown. He indicated that his daughter is alive.  Social History    Social History   Socioeconomic History   Marital status: Married    Spouse name: Not on file   Number of children: Not on file   Years of education: Not on file   Highest education level: Not on file  Occupational History   Not on file  Tobacco Use   Smoking status: Never   Smokeless tobacco: Never  Vaping Use   Vaping Use: Never used  Substance and Sexual Activity   Alcohol use: Yes    Comment: at least one 40 oz beer daily   Drug use: Never   Sexual activity: Not on file  Other Topics Concern   Not on file  Social History Narrative  Not on file   Social Determinants of Health   Financial Resource Strain: Not on file  Food Insecurity: No Food Insecurity (07/30/2022)   Hunger Vital Sign    Worried About Running Out of Food in the Last Year: Never true    Ran Out of Food in the Last Year: Never true  Transportation Needs: No Transportation Needs (07/30/2022)   PRAPARE - Administrator, Civil Service (Medical): No    Lack of Transportation (Non-Medical): No  Physical Activity: Not on file  Stress: Not on file  Social Connections: Not on file  Intimate Partner Violence: Not At Risk (07/30/2022)   Humiliation, Afraid, Rape, and Kick questionnaire    Fear of Current or Ex-Partner: No    Emotionally Abused: No    Physically Abused: No    Sexually Abused: No     Review of Systems    General:  No chills, fever, night sweats or weight changes.  Cardiovascular:  No chest pain, dyspnea on exertion, edema, orthopnea, palpitations, paroxysmal nocturnal dyspnea. Dermatological: No rash, lesions/masses Respiratory: No cough, dyspnea Urologic: No hematuria, dysuria Abdominal:   No nausea, vomiting,  diarrhea, bright red blood per rectum, melena, or hematemesis Neurologic:  No visual changes, wkns, changes in mental status. All other systems reviewed and are otherwise negative except as noted above.  Physical Exam    VS:  There were no vitals taken for this visit. , BMI There is no height or weight on file to calculate BMI. GEN: Well nourished, well developed, in no acute distress. HEENT: normal. Neck: Supple, no JVD, carotid bruits, or masses. Cardiac: RRR, no murmurs, rubs, or gallops. No clubbing, cyanosis, edema.  Radials/DP/PT 2+ and equal bilaterally.  Respiratory:  Respirations regular and unlabored, clear to auscultation bilaterally. GI: Soft, nontender, nondistended, BS + x 4. MS: no deformity or atrophy. Skin: warm and dry, no rash. Neuro:  Strength and sensation are intact. Psych: Normal affect.  Accessory Clinical Findings    Recent Labs: 08/01/2022: TSH 3.619 08/20/2022: ALT 18 08/21/2022: B Natriuretic Peptide 142.7; Magnesium 1.5 08/26/2022: BUN 24; Creatinine, Ser 2.86; Hemoglobin 6.9; Platelets 252; Potassium 3.4; Sodium 139   Recent Lipid Panel No results found for: "CHOL", "TRIG", "HDL", "CHOLHDL", "VLDL", "LDLCALC", "LDLDIRECT"       ECG personally reviewed by me today-  normal sinus rhythm minimal voltage criteria for LVH 71 bpm.- No acute changes  Echocardiogram 07/29/2022  IMPRESSIONS     1. Intracavitary gradient is realted to dynamic function. Left  ventricular ejection fraction, by estimation, is >75%. The left ventricle  has hyperdynamic function. The left ventricle has no regional wall motion  abnormalities. There is mild concentric  left ventricular hypertrophy. Left ventricular diastolic parameters were  normal.   2. Right ventricular systolic function is hyperdynamic. The right  ventricular size is normal. Tricuspid regurgitation signal is inadequate  for assessing PA pressure.   3. The mitral valve is normal in structure. No evidence of  mitral valve  regurgitation.   4. The aortic valve is tricuspid. There is mild calcification of the  aortic valve. Aortic valve regurgitation is not visualized. No aortic  stenosis is present.   Comparison(s): No significant change from prior study.   FINDINGS   Left Ventricle: Intracavitary gradient is realted to dynamic function.  Left ventricular ejection fraction, by estimation, is >75%. The left  ventricle has hyperdynamic function. The left ventricle has no regional  wall motion abnormalities. The left  ventricular internal cavity size  was small. There is mild concentric left  ventricular hypertrophy. Left ventricular diastolic parameters were  normal.   Right Ventricle: The right ventricular size is normal. No increase in  right ventricular wall thickness. Right ventricular systolic function is  hyperdynamic. Tricuspid regurgitation signal is inadequate for assessing  PA pressure.   Left Atrium: Left atrial size was normal in size.   Right Atrium: Right atrial size was normal in size.   Pericardium: Trivial pericardial effusion is present. The pericardial  effusion is posterior to the left ventricle.   Mitral Valve: The mitral valve is normal in structure. No evidence of  mitral valve regurgitation.   Tricuspid Valve: The tricuspid valve is normal in structure. Tricuspid  valve regurgitation is not demonstrated.   Aortic Valve: The aortic valve is tricuspid. There is mild calcification  of the aortic valve. Aortic valve regurgitation is not visualized. No  aortic stenosis is present. Aortic valve mean gradient measures 10.0 mmHg.  Aortic valve peak gradient measures  18.5 mmHg. Aortic valve area, by VTI measures 2.53 cm.   Pulmonic Valve: The pulmonic valve was not well visualized. Pulmonic valve  regurgitation is not visualized.   Aorta: The aortic root and ascending aorta are structurally normal, with  no evidence of dilitation.   IAS/Shunts: The interatrial  septum was not well visualized.     Assessment & Plan   1.  Paroxysmal atrial fibrillation-EKG today shows normal sinus rhythm minimal voltage criteria for LVH 71 bpm.  Not a candidate for anticoagulation due to anemia and thrombocytopenia. This patients CHA2DS2-VASc Score and unadjusted Ischemic Stroke Rate (% per year) is equal to 7.2 % stroke rate/year from a score of 5 Continue amiodarone, metoprolol Increase p.o. hydration Avoid triggers caffeine, chocolate, EtOH etc. Order BMP  Syncope- had a brief episode in the setting of hypovolemia. Denies further episodes. Maintain PO hydration Change position slowly May use lower extremity support stockings Continue to monitor  Hypokalemia-potassium 3.2 on 08/28/2022.  Reviewed potassium rich foods.  Will provide list of foods with increased potassium. Repeat BMP  Anemia, thrombocytopenia-hemoglobin 7.8 and hematocrit 24.3 on 08/27/2022.  Receiving treatment for lymphoma. Follows with oncology, PCP  Lymphoma-currently on chemotherapy. Following with oncology  Disposition: Follow-up with Dr. Flora Lipps or me in 3-4 months.   Tyler [name]. Shyhiem Beeney NP-C     08/26/2022, 12:58 PM [facility] [address] Office [phone] Fax [phone]    I spent 14 minutes examining this patient, reviewing medications, and using patient centered shared decision making involving her cardiac care.  Prior to her visit I spent greater than 20 minutes reviewing her past medical history,  medications, and prior cardiac tests.

## 2022-08-26 NOTE — TOC Progression Note (Signed)
Transition of Care [facility]) - Progression Note    Patient Details  Name: Masayuki Burghardt MRN: [id number] Date of Birth: 1950-11-16  Transition of Care [facility]) CM/SW Contact  Mearl Latin, LCSW Phone Number: 08/26/2022, 1:20 PM  Clinical Narrative:    1:20pm-CSW spoke with patient's daughter. She is contacting the SNF options now and will call CSW back with a choice.    Expected Discharge Plan: Skilled Nursing Facility Barriers to Discharge: Continued Medical Work up, English as a second language teacher, SNF Pending bed offer  Expected Discharge Plan and Services In-house Referral: Clinical Social Work                                             Social Determinants of Health (SDOH) Interventions SDOH Screenings   Food Insecurity: No Food Insecurity (07/30/2022)  Housing: Low Risk  (07/30/2022)  Transportation Needs: No Transportation Needs (07/30/2022)  Utilities: Not At Risk (07/30/2022)  Tobacco Use: Low Risk  (08/26/2022)    Readmission Risk Interventions    08/22/2022    3:06 PM 07/30/2022    2:44 PM  Readmission Risk Prevention Plan  Transportation Screening Complete Complete  PCP or Specialist Appt within 3-5 Days  Complete  HRI or Home Care Consult  Complete  Social Work Consult for Recovery Care Planning/Counseling  Complete  Palliative Care Screening  Not Applicable  Medication Review Oceanographer) Complete Complete  PCP or Specialist appointment within 3-5 days of discharge Complete   HRI or Home Care Consult Complete   SW Recovery Care/Counseling Consult Complete   Palliative Care Screening Complete   Skilled Nursing Facility Complete

## 2022-08-26 NOTE — Progress Notes (Signed)
PROGRESS NOTE        PATIENT DETAILS Name: Tyler [name] Age: 72 y.o. Sex: male Date of Birth: 09-Aug-1950 Admit Date: 07/29/2022 Admitting Physician Rometta Emery, MD WUJ:[id number], Mabeline Caras, FNP  Brief Summary: Patient is a 72 y.o.  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  Stabilized in the ICU-and subsequently transferred to the hospitalist service.  See below for further details.  Significant events: 6/03>> admit to [facility]-sepsis/near syncope-presumed aspiration pneumonia.  6/10>> mini CHOP 6/13>> transferred to ICU-worsening renal failure/hyperkalemia-intubated, CRRT started.   6/16>> extubate 6/19>> Rituximab, CRRT 6/22>> started on HD 6/22>>transferred to hospitalist service 6/25>> melanotic stools-GI consulted 6/26>> EGD-nonbleeding gastric ulcer 6/29>> nephrology signed off  Significant studies: 6/3>> CT abdomen/pelvis: No acute process.  Numerous lymph nodes of multiple sizes present throughout the abdomen/pelvis. 6/3>> CT head: No acute intracranial abnormality 6/3>> TTE: EF> 75%. 6/4>> MRI brain: No acute intracranial abnormality 6/4>> CT maxillofacial: Large periapical lucency at the root of the most posterior remaining right maxillary tooth-no abscess.  Enlarged submandibular lymph nodes. 6/8>> CT chest: Bulky bilateral axillary, hilar, mediastinal lymphadenopathy. 6/11>> renal ultrasound: No obstructive uropathy. 6/28>> right upper extremity Doppler ultrasound: No DVT.  Significant microbiology data: 6/3>> COVID/influenza/RSV PCR: Negative 6/3>> blood culture: Negative 6/5>> blood culture: Negative 6/13>> tracheal aspirate: Normal respiratory flora  Procedures: 6/13-6/16>> ETT 6/26>> EGD: None leading gastric  ulcer  Consults: PCCM Nephrology GI Infectious disease CIR Cardiology IR  Subjective: Lying comfortably in bed-denies any chest pain or shortness of breath.  Had BM yesterday-reportedly brown in color.  Objective: Vitals: Blood pressure 120/75, pulse 77, temperature 98.3 F (36.8 C), temperature source Oral, resp. rate 20, height 5\' 7"  (1.702 m), weight 84.6 kg, SpO2 97 %.   Exam: Gen Exam:not in any distress HEENT:atraumatic, normocephalic Chest: B/L clear to auscultation anteriorly CVS:S1S2 regular Abdomen:soft non tender, non distended Extremities:no edema Neurology: Non focal Skin: no rash  Pertinent Labs/Radiology:    Latest Ref Rng & Units 08/26/2022    3:45 AM 08/25/2022    3:35 AM 08/24/2022    9:16 AM  CBC  WBC 4.0 - 10.5 K/uL 8.5  9.3  10.1   Hemoglobin 13.0 - 17.0 g/dL 6.9  7.5  7.5   Hematocrit 39.0 - 52.0 % 21.4  22.8  22.8   Platelets 150 - 400 K/uL 252  272  259     Lab Results  Component Value Date   NA 139 08/26/2022   K 3.4 (L) 08/26/2022   CL 105 08/26/2022   CO2 24 08/26/2022      Assessment/Plan: Acute hypoxic respiratory failure Septic shock secondary to aspiration pneumonia Resolved On room air All cultures negative Completed antibiotics  AKI Multifactorial-but main culprit felt to be tumor lysis syndrome-with some contribution from sepsis physiology Required CRRT and then intermittent HD Renal function continues to improve  Nephrology has signed off-will need outpatient follow-up with nephrology.  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  Pancytopenia Felt to be multifactorial-acute illness/chemotherapy Required 13 units of PRBC and 2 units of platelets this admit Off Granix since 6/23 Counts improving-follow CBC  Anemia Worsening Hb today-suspect this is more from acute  illness-no evidence of GI bleeding-brown stools yesterday Being transfused 1 unit of PRBC this morning. Follow CBC  Upper GI bleeding Occurred on 6/25 Thankfully has resolved with supportive care-brown stools yesterday EGD-with nonbleeding gastric ulcer EGD biopsy negative for H. pylori/dysplasia If bleeding reoccurs-May need bleeding scan. Continue PPI  Acute metabolic encephalopathy Secondary to AKI/sepsis Resolved.  PAF Sinus rhythm Amiodarone No AC due to high bleeding risk  History of chronic hepatitis B On Rituxan for lymphoma Dr. Harmon Pier MD-recommended tenofovir while on rituximab.  Debility/deconditioning Oropharyngeal dysphagia Overall improved NG tube discontinued 6/23-tolerating dysphagia 3 diet SNF planned on discharge  Nutrition Status: Nutrition Problem: Increased nutrient needs Etiology: chronic illness (lymphoma) Signs/Symptoms: estimated needs Interventions: MVI   BMI: Estimated body mass index is 29.21 kg/m as calculated from the following:   Height as of this encounter: 5\' 7"  (1.702 m).   Weight as of this encounter: 84.6 kg.   Code status:   Code Status: Full Code   DVT Prophylaxis: Place and maintain sequential compression device Start: 08/07/22 0936 SCDs Start: 07/30/22 0108   Family Communication: None at bedside   Disposition Plan: Status is: Inpatient Remains inpatient appropriate because: Severity of illness   Planned Discharge Destination:Skilled nursing facility   Diet: Diet Order             Diet NPO time specified Except for: Sips with Meds  Diet effective midnight                     Antimicrobial agents: Anti-infectives (From admission, onward)    Start     Dose/Rate Route Frequency Ordered Stop   08/21/22 1045  Tenofovir Alafenamide Fumarate TABS 25 mg        25 mg Oral Daily 08/21/22 0947     08/08/22 1500  piperacillin-tazobactam (ZOSYN) IVPB 3.375 g        3.375 g 100 mL/hr over 30 Minutes  Intravenous Every 6 hours 08/08/22 1321 08/13/22 0934   07/30/22 1600  vancomycin (VANCOREADY) IVPB 1750 mg/350 mL  Status:  Discontinued        1,750 mg 175 mL/hr over 120 Minutes Intravenous Every 24 hours 07/29/22 2108 08/01/22 1135   07/30/22 0000  ceFEPIme (MAXIPIME) 2 g in sodium chloride 0.9 % 100 mL IVPB        2 g 200 mL/hr over 30 Minutes Intravenous Every 8 hours 07/29/22 2108 08/05/22 1833   07/29/22 1530  ceFEPIme (MAXIPIME) 2 g in sodium chloride 0.9 % 100 mL IVPB        2 g 200 mL/hr over 30 Minutes Intravenous  Once 07/29/22 1517 07/29/22 1628   07/29/22 1530  metroNIDAZOLE (FLAGYL) IVPB 500 mg        500 mg 100 mL/hr over 60 Minutes Intravenous  Once 07/29/22 1517 07/29/22 1701   07/29/22 1530  vancomycin (VANCOCIN) IVPB 1000 mg/200 mL premix  Status:  Discontinued        1,000 mg 200 mL/hr over 60 Minutes Intravenous  Once 07/29/22 1517 07/29/22 1520   07/29/22 1530  vancomycin (VANCOREADY) IVPB 1750 mg/350 mL        1,750 mg 175 mL/hr over 120 Minutes Intravenous  Once 07/29/22 1520 07/29/22 1908        MEDICATIONS: Scheduled Meds:  sodium chloride   Intravenous Once   acetaminophen  650 mg Per Tube Once   allopurinol  100 mg Oral BID   amiodarone  200 mg Oral Daily   Chlorhexidine Gluconate Cloth  6 each Topical Q0600   insulin aspart  0-6 Units Subcutaneous TID WC   multivitamin  1 tablet Oral QHS   mouth rinse  15 mL Mouth Rinse 4 times per day   pantoprazole  40 mg Oral BID AC   Tenofovir Alafenamide Fumarate  25 mg Oral Daily   Continuous Infusions:  sodium chloride Stopped (08/13/22 0021)   sodium chloride 50 mL/hr at 08/26/22 0014   PRN Meds:.docusate, ipratropium-albuterol, lip balm, melatonin, ondansetron **OR** ondansetron (ZOFRAN) IV, oxyCODONE, phenol, polyethylene glycol, polyvinyl alcohol, senna, sodium chloride flush, traZODone   I have personally reviewed following labs and imaging studies  LABORATORY DATA: CBC: Recent Labs  Lab  08/21/22 0334 08/22/22 0323 08/22/22 1228 08/23/22 1003 08/24/22 0916 08/25/22 0335 08/26/22 0345  WBC 12.1* 10.3  --  11.8* 10.1 9.3 8.5  NEUTROABS 7.7 7.7  --   --  6.6 6.0 5.5  HGB 7.8* 6.4* 8.0* 8.2* 7.5* 7.5* 6.9*  HCT 23.5* 19.6* 24.3* 24.4* 22.8* 22.8* 21.4*  MCV 87.0 89.9  --  87.8 91.2 91.9 91.1  PLT 152 166  --  245 259 272 252    Basic Metabolic Panel: Recent Labs  Lab 08/20/22 0302 08/21/22 0334 08/22/22 0323 08/23/22 0407 08/24/22 0251 08/25/22 0335 08/26/22 0345  NA 137 132* 137 136 138 139 139  K 4.0 4.2 3.7 3.2* 3.4* 3.5 3.4*  CL 102 101 102 104 108 107 105  CO2 24 21* 21* 22 23 24 24   GLUCOSE 78 99 89 88 88 105* 95  BUN 45* 55* 57* 45* 40* 31* 24*  CREATININE 3.38* 3.96* 4.05* 3.72* 3.48* 3.25* 2.86*  CALCIUM 7.2* 7.1* 7.5* 7.4* 7.4* 7.7* 7.4*  MG 1.6* 1.5*  --   --   --   --   --   PHOS  --   --   --  5.6* 5.2*  --   --     GFR: Estimated Creatinine Clearance: 25 mL/min (A) (by C-G formula based on SCr of 2.86 mg/dL (H)).  Liver Function Tests: Recent Labs  Lab 08/20/22 0302 08/23/22 0407 08/24/22 0251  AST 25  --   --   ALT 18  --   --   ALKPHOS 111  --   --   BILITOT 0.8  --   --   PROT 3.9*  --   --   ALBUMIN 1.7* 1.7* 1.6*   No results for input(s): "LIPASE", "AMYLASE" in the last 168 hours. No results for input(s): "AMMONIA" in the last 168 hours.  Coagulation Profile: No results for input(s): "INR", "PROTIME" in the last 168 hours.  Cardiac Enzymes: No results for input(s): "CKTOTAL", "CKMB", "CKMBINDEX", "TROPONINI" in the last 168 hours.  BNP (last 3 results) No results for input(s): "PROBNP" in the last 8760 hours.  Lipid Profile: No results for input(s): "CHOL", "HDL", "LDLCALC", "TRIG", "CHOLHDL", "LDLDIRECT" in the last 72 hours.  Thyroid Function Tests: No results for input(s): "TSH", "T4TOTAL", "FREET4", "T3FREE", "THYROIDAB" in the last 72 hours.  Anemia Panel: No results for input(s): "VITAMINB12", "FOLATE",  "FERRITIN", "TIBC", "IRON", "RETICCTPCT" in the last 72 hours.  Urine analysis:    Component Value Date/Time   COLORURINE AMBER (A) 08/06/2022 0859   APPEARANCEUR TURBID (A) 08/06/2022 0859   LABSPEC 1.023 08/06/2022 0859   PHURINE 5.0 08/06/2022 0859   GLUCOSEU NEGATIVE 08/06/2022 0859   HGBUR MODERATE (A) 08/06/2022 0859   BILIRUBINUR SMALL (A) 08/06/2022  0859   KETONESUR 5 (A) 08/06/2022 0859   PROTEINUR 100 (A) 08/06/2022 0859   NITRITE NEGATIVE 08/06/2022 0859   LEUKOCYTESUR NEGATIVE 08/06/2022 0859    Sepsis Labs: Lactic Acid, Venous    Component Value Date/Time   LATICACIDVEN 2.9 (HH) 08/01/2022 0620    MICROBIOLOGY: No results found for this or any previous visit (from the past 240 hour(s)).  RADIOLOGY STUDIES/RESULTS: No results found.   LOS: 28 days   Jeoffrey Massed, MD  [facility]    To contact the attending provider between 7A-7P or the covering provider during after hours 7P-7A, please log into the web site [email or web address] and access using universal [place] password for that web site. If you do not have the password, please call the hospital operator.  08/26/2022, 9:07 AM

## 2022-08-27 DIAGNOSIS — K250 Acute gastric ulcer with hemorrhage: Secondary | ICD-10-CM

## 2022-08-27 LAB — CBC WITH DIFFERENTIAL/PLATELET
Abs Immature Granulocytes: 0.16 10*3/uL — ABNORMAL HIGH (ref 0.00–0.07)
Basophils Absolute: 0.1 10*3/uL (ref 0.0–0.1)
Basophils Relative: 1 %
Eosinophils Absolute: 0 10*3/uL (ref 0.0–0.5)
Eosinophils Relative: 1 %
HCT: 24.3 % — ABNORMAL LOW (ref 39.0–52.0)
Hemoglobin: 7.8 g/dL — ABNORMAL LOW (ref 13.0–17.0)
Immature Granulocytes: 2 %
Lymphocytes Relative: 20 %
Lymphs Abs: 1.6 10*3/uL (ref 0.7–4.0)
MCH: 29.8 pg (ref 26.0–34.0)
MCHC: 32.1 g/dL (ref 30.0–36.0)
MCV: 92.7 fL (ref 80.0–100.0)
Monocytes Absolute: 1.4 10*3/uL — ABNORMAL HIGH (ref 0.1–1.0)
Monocytes Relative: 17 %
Neutro Abs: 4.7 10*3/uL (ref 1.7–7.7)
Neutrophils Relative %: 59 %
Platelets: 277 10*3/uL (ref 150–400)
RBC: 2.62 MIL/uL — ABNORMAL LOW (ref 4.22–5.81)
RDW: 15.1 % (ref 11.5–15.5)
WBC: 7.9 10*3/uL (ref 4.0–10.5)
nRBC: 0 % (ref 0.0–0.2)

## 2022-08-27 LAB — TYPE AND SCREEN: ABO/RH(D): O POS

## 2022-08-27 LAB — BPAM RBC
ISSUE DATE / TIME: 202407010753
Unit Type and Rh: 5100

## 2022-08-27 LAB — BASIC METABOLIC PANEL
Anion gap: 7 (ref 5–15)
BUN: 21 mg/dL (ref 8–23)
CO2: 24 mmol/L (ref 22–32)
Calcium: 7.2 mg/dL — ABNORMAL LOW (ref 8.9–10.3)
Chloride: 106 mmol/L (ref 98–111)
Creatinine, Ser: 2.29 mg/dL — ABNORMAL HIGH (ref 0.61–1.24)
GFR, Estimated: 30 mL/min — ABNORMAL LOW (ref >60)
Glucose, Bld: 90 mg/dL (ref 70–99)
Potassium: 3.5 mmol/L (ref 3.5–5.1)
Sodium: 137 mmol/L (ref 135–145)

## 2022-08-27 LAB — GLUCOSE, CAPILLARY
Glucose-Capillary: 84 mg/dL (ref 70–99)
Glucose-Capillary: 111 mg/dL — ABNORMAL HIGH (ref 70–99)
Glucose-Capillary: 113 mg/dL — ABNORMAL HIGH (ref 70–99)
Glucose-Capillary: 103 mg/dL — ABNORMAL HIGH (ref 70–99)

## 2022-08-27 NOTE — Progress Notes (Signed)
PROGRESS NOTE        PATIENT DETAILS Name: Tyler [name] Age: 72 y.o. Sex: male Date of Birth: 07-31-1950 Admit Date: 07/29/2022 Admitting Physician Rometta Emery, MD QIH:[id number], Mabeline Caras, FNP  Brief Summary: Patient is a 72 y.o.  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  Stabilized in the ICU-and subsequently transferred to the hospitalist service.  See below for further details.  Significant events: 6/03>> admit to [facility]-sepsis/near syncope-presumed aspiration pneumonia.  6/10>> mini CHOP 6/13>> transferred to ICU-worsening renal failure/hyperkalemia-intubated, CRRT started.   6/16>> extubate 6/19>> Rituximab, CRRT 6/22>> started on HD 6/22>>transferred to hospitalist service 6/25>> melanotic stools-GI consulted 6/26>> EGD-nonbleeding gastric ulcer 6/29>> nephrology signed off  Significant studies: 6/3>> CT abdomen/pelvis: No acute process.  Numerous lymph nodes of multiple sizes present throughout the abdomen/pelvis. 6/3>> CT head: No acute intracranial abnormality 6/3>> TTE: EF> 75%. 6/4>> MRI brain: No acute intracranial abnormality 6/4>> CT maxillofacial: Large periapical lucency at the root of the most posterior remaining right maxillary tooth-no abscess.  Enlarged submandibular lymph nodes. 6/8>> CT chest: Bulky bilateral axillary, hilar, mediastinal lymphadenopathy. 6/11>> renal ultrasound: No obstructive uropathy. 6/28>> right upper extremity Doppler ultrasound: No DVT.  Significant microbiology data: 6/3>> COVID/influenza/RSV PCR: Negative 6/3>> blood culture: Negative 6/5>> blood culture: Negative 6/13>> tracheal aspirate: Normal respiratory flora  Procedures: 6/13-6/16>> ETT 6/26>> EGD: None leading gastric ulcer 7/01>>  Port-A-Cath placement by IR.  Consults: PCCM Nephrology GI Infectious disease CIR Cardiology IR  Subjective: No issues overnight-lying comfortably in bed-awaiting SNF placement.  Objective: Vitals: Blood pressure 133/80, pulse 80, temperature 98 F (36.7 C), resp. rate 14, height 5\' 7"  (1.702 m), weight 84.6 kg, SpO2 94 %.   Exam: Gen Exam:Alert awake-not in any distress HEENT:atraumatic, normocephalic Chest: B/L clear to auscultation anteriorly CVS:S1S2 regular Abdomen:soft non tender, non distended Extremities:no edema Neurology: Non focal Skin: no rash  Pertinent Labs/Radiology:    Latest Ref Rng & Units 08/27/2022    3:34 AM 08/26/2022    3:45 AM 08/25/2022    3:35 AM  CBC  WBC 4.0 - 10.5 K/uL 7.9  8.5  9.3   Hemoglobin 13.0 - 17.0 g/dL 7.8  6.9  7.5   Hematocrit 39.0 - 52.0 % 24.3  21.4  22.8   Platelets 150 - 400 K/uL 277  252  272     Lab Results  Component Value Date   NA 137 08/27/2022   K 3.5 08/27/2022   CL 106 08/27/2022   CO2 24 08/27/2022      Assessment/Plan: Acute hypoxic respiratory failure Septic shock secondary to aspiration pneumonia Resolved On room air All cultures negative Completed antibiotics  AKI Multifactorial-but main culprit felt to be tumor lysis syndrome-with some contribution from sepsis physiology Required CRRT and then intermittent HD Renal function continues to improve  Nephrology has signed off-will need outpatient follow-up with nephrology.  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  No evidence of active GI bleeding over the past several days.  Anemia is  now felt to be due to underlying malignancy.  Continue to follow CBC periodically.  Upper GI bleeding Occurred on 6/25 Thankfully has resolved with supportive care-brown stools yesterday EGD-with nonbleeding gastric ulcer EGD biopsy negative for H. pylori/dysplasia If bleeding reoccurs-May need bleeding scan. Continue PPI  Acute metabolic encephalopathy Secondary to AKI/sepsis Resolved.  PAF Sinus rhythm Amiodarone No AC due to high bleeding risk  History of chronic hepatitis B On Rituxan for lymphoma Dr. Harmon Pier MD-recommended tenofovir while on rituximab.  Debility/deconditioning Oropharyngeal dysphagia Overall improved NG tube discontinued 6/23-tolerating dysphagia 3 diet SNF planned on discharge  Nutrition Status: Nutrition Problem: Increased nutrient needs Etiology: chronic illness (lymphoma) Signs/Symptoms: estimated needs Interventions: MVI   BMI: Estimated body mass index is 29.21 kg/m as calculated from the following:   Height as of this encounter: 5\' 7"  (1.702 m).   Weight as of this encounter: 84.6 kg.   Code status:   Code Status: Full Code   DVT Prophylaxis: Place and maintain sequential compression device Start: 08/07/22 0936 SCDs Start: 07/30/22 0108   Family Communication: None at bedside   Disposition Plan: Status is: Inpatient Remains inpatient appropriate because: Severity of illness   Planned Discharge Destination:Skilled nursing facility   Diet: Diet Order             DIET DYS 3 Room service appropriate? Yes; Fluid consistency: Thin  Diet effective now                     Antimicrobial agents: Anti-infectives (From admission, onward)    Start     Dose/Rate Route Frequency Ordered Stop   08/21/22 1045  Tenofovir Alafenamide Fumarate TABS 25 mg        25 mg Oral Daily 08/21/22 0947     08/08/22 1500  piperacillin-tazobactam (ZOSYN) IVPB 3.375 g        3.375 g 100 mL/hr over 30 Minutes Intravenous Every 6 hours  08/08/22 1321 08/13/22 0934   07/30/22 1600  vancomycin (VANCOREADY) IVPB 1750 mg/350 mL  Status:  Discontinued        1,750 mg 175 mL/hr over 120 Minutes Intravenous Every 24 hours 07/29/22 2108 08/01/22 1135   07/30/22 0000  ceFEPIme (MAXIPIME) 2 g in sodium chloride 0.9 % 100 mL IVPB        2 g 200 mL/hr over 30 Minutes Intravenous Every 8 hours 07/29/22 2108 08/05/22 1833   07/29/22 1530  ceFEPIme (MAXIPIME) 2 g in sodium chloride 0.9 % 100 mL IVPB        2 g 200 mL/hr over 30 Minutes Intravenous  Once 07/29/22 1517 07/29/22 1628   07/29/22 1530  metroNIDAZOLE (FLAGYL) IVPB 500 mg        500 mg 100 mL/hr over 60 Minutes Intravenous  Once 07/29/22 1517 07/29/22 1701   07/29/22 1530  vancomycin (VANCOCIN) IVPB 1000 mg/200 mL premix  Status:  Discontinued        1,000 mg 200 mL/hr over 60 Minutes Intravenous  Once 07/29/22 1517 07/29/22 1520   07/29/22 1530  vancomycin (VANCOREADY) IVPB 1750 mg/350 mL        1,750 mg 175 mL/hr over 120 Minutes Intravenous  Once 07/29/22 1520 07/29/22 1908        MEDICATIONS: Scheduled Meds:  sodium chloride   Intravenous Once   acetaminophen  650 mg Per Tube Once   allopurinol  100 mg  Oral BID   amiodarone  200 mg Oral Daily   Chlorhexidine Gluconate Cloth  6 each Topical Q0600   insulin aspart  0-6 Units Subcutaneous TID WC   multivitamin  1 tablet Oral QHS   mouth rinse  15 mL Mouth Rinse 4 times per day   pantoprazole  40 mg Oral BID AC   Tenofovir Alafenamide Fumarate  25 mg Oral Daily   Continuous Infusions:  sodium chloride Stopped (08/13/22 0021)   sodium chloride 50 mL/hr at 08/26/22 0014   PRN Meds:.docusate, ipratropium-albuterol, lip balm, melatonin, ondansetron **OR** ondansetron (ZOFRAN) IV, oxyCODONE, phenol, polyethylene glycol, polyvinyl alcohol, senna, sodium chloride flush, traZODone   I have personally reviewed following labs and imaging studies  LABORATORY DATA: CBC: Recent Labs  Lab 08/22/22 0323 08/22/22 1228  08/23/22 1003 08/24/22 0916 08/25/22 0335 08/26/22 0345 08/27/22 0334  WBC 10.3  --  11.8* 10.1 9.3 8.5 7.9  NEUTROABS 7.7  --   --  6.6 6.0 5.5 4.7  HGB 6.4*   < > 8.2* 7.5* 7.5* 6.9* 7.8*  HCT 19.6*   < > 24.4* 22.8* 22.8* 21.4* 24.3*  MCV 89.9  --  87.8 91.2 91.9 91.1 92.7  PLT 166  --  245 259 272 252 277   < > = values in this interval not displayed.     Basic Metabolic Panel: Recent Labs  Lab 08/21/22 0334 08/22/22 0323 08/23/22 0407 08/24/22 0251 08/25/22 0335 08/26/22 0345 08/27/22 0334  NA 132*   < > 136 138 139 139 137  K 4.2   < > 3.2* 3.4* 3.5 3.4* 3.5  CL 101   < > 104 108 107 105 106  CO2 21*   < > 22 23 24 24 24   GLUCOSE 99   < > 88 88 105* 95 90  BUN 55*   < > 45* 40* 31* 24* 21  CREATININE 3.96*   < > 3.72* 3.48* 3.25* 2.86* 2.29*  CALCIUM 7.1*   < > 7.4* 7.4* 7.7* 7.4* 7.2*  MG 1.5*  --   --   --   --   --   --   PHOS  --   --  5.6* 5.2*  --   --   --    < > = values in this interval not displayed.     GFR: Estimated Creatinine Clearance: 31.2 mL/min (A) (by C-G formula based on SCr of 2.29 mg/dL (H)).  Liver Function Tests: Recent Labs  Lab 08/23/22 0407 08/24/22 0251  ALBUMIN 1.7* 1.6*    No results for input(s): "LIPASE", "AMYLASE" in the last 168 hours. No results for input(s): "AMMONIA" in the last 168 hours.  Coagulation Profile: No results for input(s): "INR", "PROTIME" in the last 168 hours.  Cardiac Enzymes: No results for input(s): "CKTOTAL", "CKMB", "CKMBINDEX", "TROPONINI" in the last 168 hours.  BNP (last 3 results) No results for input(s): "PROBNP" in the last 8760 hours.  Lipid Profile: No results for input(s): "CHOL", "HDL", "LDLCALC", "TRIG", "CHOLHDL", "LDLDIRECT" in the last 72 hours.  Thyroid Function Tests: No results for input(s): "TSH", "T4TOTAL", "FREET4", "T3FREE", "THYROIDAB" in the last 72 hours.  Anemia Panel: No results for input(s): "VITAMINB12", "FOLATE", "FERRITIN", "TIBC", "IRON", "RETICCTPCT" in  the last 72 hours.  Urine analysis:    Component Value Date/Time   COLORURINE AMBER (A) 08/06/2022 0859   APPEARANCEUR TURBID (A) 08/06/2022 0859   LABSPEC 1.023 08/06/2022 0859   PHURINE 5.0 08/06/2022 0859   GLUCOSEU NEGATIVE  08/06/2022 0859   HGBUR MODERATE (A) 08/06/2022 0859   BILIRUBINUR SMALL (A) 08/06/2022 0859   KETONESUR 5 (A) 08/06/2022 0859   PROTEINUR 100 (A) 08/06/2022 0859   NITRITE NEGATIVE 08/06/2022 0859   LEUKOCYTESUR NEGATIVE 08/06/2022 0859    Sepsis Labs: Lactic Acid, Venous    Component Value Date/Time   LATICACIDVEN 2.9 (HH) 08/01/2022 0620    MICROBIOLOGY: No results found for this or any previous visit (from the past 240 hour(s)).  RADIOLOGY STUDIES/RESULTS: IR IMAGING GUIDED PORT INSERTION  Result Date: 08/26/2022 INDICATION: 72 year old male with history of lymphoma requiring central venous access for chemotherapy administration. EXAM: IMPLANTED PORT A CATH PLACEMENT WITH ULTRASOUND AND FLUOROSCOPIC GUIDANCE COMPARISON:  None Available. MEDICATIONS: None. ANESTHESIA/SEDATION: Moderate (conscious) sedation was employed during this procedure. A total of Versed 2 mg and Fentanyl 100 mcg was administered intravenously. Moderate Sedation Time: 14 minutes. The patient's level of consciousness and vital signs were monitored continuously by radiology nursing throughout the procedure under my direct supervision. CONTRAST:  None FLUOROSCOPY TIME:  One mGy COMPLICATIONS: None immediate. PROCEDURE: The procedure, risks, benefits, and alternatives were explained to the patient. Questions regarding the procedure were encouraged and answered. The patient understands and consents to the procedure. The right neck and chest were prepped with chlorhexidine in a sterile fashion, and a sterile drape was applied covering the operative field. Maximum barrier sterile technique with sterile gowns and gloves were used for the procedure. A timeout was performed prior to the initiation  of the procedure. Ultrasound was used to examine the jugular vein which was compressible and free of internal echoes. A skin marker was used to demarcate the planned venotomy and port pocket incision sites. Local anesthesia was provided to these sites and the subcutaneous tunnel track with 1% lidocaine with 1:100,000 epinephrine. A small incision was created at the jugular access site and blunt dissection was performed of the subcutaneous tissues. Under ultrasound guidance, the jugular vein was accessed with a 21 ga micropuncture needle and an 0.018" wire was inserted to the superior vena cava. Real-time ultrasound guidance was utilized for vascular access including the acquisition of a permanent ultrasound image documenting patency of the accessed vessel. A 5 Fr micopuncture set was then used, through which a 0.035" Rosen wire was passed under fluoroscopic guidance into the inferior vena cava. An 8 Fr dilator was then placed over the wire. A subcutaneous port pocket was then created along the upper chest wall utilizing a combination of sharp and blunt dissection. The pocket was irrigated with sterile saline, packed with gauze, and observed for hemorrhage. A single lumen "ISP" sized power injectable port was chosen for placement. The 8 Fr catheter was tunneled from the port pocket site to the venotomy incision. The port was placed in the pocket. The external catheter was trimmed to appropriate length. The dilator was exchanged for an 8 Fr peel-away sheath under fluoroscopic guidance. The catheter was then placed through the sheath and the sheath was removed. Final catheter positioning was confirmed and documented with a fluoroscopic spot radiograph. The port was accessed with a Huber needle, aspirated, and flushed with heparinized saline. The deep dermal layer of the port pocket incision was closed with interrupted 3-0 Vicryl suture. Dermabond was then placed over the port pocket and neck incisions. The patient  tolerated the procedure well without immediate post procedural complication. FINDINGS: After catheter placement, the tip lies within the superior cavoatrial junction. The catheter aspirates and flushes normally and is ready for immediate use.  IMPRESSION: Successful placement of a power injectable Port-A-Cath via the right internal jugular vein. The catheter is ready for immediate use. Marliss Coots, MD [facility] Electronically Signed   By: Marliss Coots M.D.   On: 08/26/2022 12:23     LOS: 29 days   Jeoffrey Massed, MD  [facility]    To contact the attending provider between 7A-7P or the covering provider during after hours 7P-7A, please log into the web site [email or web address] and access using universal [place] password for that web site. If you do not have the password, please call the hospital operator.  08/27/2022, 9:53 AM

## 2022-08-27 NOTE — Progress Notes (Signed)
Physical Therapy Treatment Patient Details Name: Tyler [name] MRN: [id number] DOB: 1950-04-22 Today's Date: 08/27/2022   History of Present Illness Pt is a 72 yo male who presented to [facility] ED on 6/3 with poor po intake, abdominal pain, near syncope, fever, hypotension, and elevated lactic acid level. Pt found to have sepsis, severe anemia, subacute L4 fx, and generalized lymphadenopathy from high grade large B cell lymphoma. While at [facility], pt had seizure like activity, Rt facial paresthesia, and developed renal failure. Seen by cardiology for PAF. PICC placed and started on R mini CHOP on 08/05/22. Worsening renal fx, concern for tumor lysis syndrome and received rasburicase. + Concern for aspiration pneumonitis. In ICU started on CRRT and subsequently switched to HD treatments and transferred to [facility] on 6/22/202. S/p Endoscopy 6/26 w/findings: Normal esophagus, non-bleeding gastric ulcer with no stigmata of bleeding. Biopsied. Normal examined duodenum. PMH: PE, hypertension, paroxysmal atrial fibrillation, and recurrent UTIs.    PT Comments  Pt tolerated today's session well, but continues to be limited by activity tolerance, balance, and overall strength. Pt requires minG-minA for all mobility with use of RW, but requiring verbal cueing throughout for safety and proper techniques with mobility. Pt also requires increased cueing for self monitoring endurance and symptoms, cueing given to take rest breaks as needed, as pt noted to decrease conversation with fatigue. Acute PT will continue to follow up with pt as appropriate to progress mobility, discharge recommendations updated to subacute PT in a facility setting to allow maximum independence prior to returning home.     Assistance Recommended at Discharge Intermittent Supervision/Assistance  If plan is discharge home, recommend the following:  Can travel by private vehicle    A little help with bathing/dressing/bathroom;Assistance with  cooking/housework;Assist for transportation;Help with stairs or ramp for entrance;A little help with walking and/or transfers   No (pending progress; improving)  Equipment Recommendations  Other (comment) (TBD pending progress)    Recommendations for Other Services       Precautions / Restrictions Precautions Precautions: Fall Precaution Comments: L4 compression fracture, Protective Precs (chemo on 7/2) Restrictions Weight Bearing Restrictions: No     Mobility  Bed Mobility Overal bed mobility: Needs Assistance Bed Mobility: Supine to Sit, Sit to Supine     Supine to sit: Min guard, HOB elevated Sit to supine: Min guard, HOB elevated   General bed mobility comments: increased time but able to complete bed mobility with minG for safety at EOB with use of bed rail    Transfers Overall transfer level: Needs assistance Equipment used: Rolling walker (2 wheels) Transfers: Sit to/from Stand Sit to Stand: Min guard           General transfer comment: Cueing for proper hand placement but able to stand from bed x2 trials with minG for safety and balance    Ambulation/Gait Ambulation/Gait assistance: Min guard, Min assist Gait Distance (Feet): 50 Feet (60, 50, with standing rest breaks between each trial) Assistive device: Rolling walker (2 wheels) Gait Pattern/deviations: Decreased step length - right, Decreased step length - left, Decreased stride length, Trunk flexed, Drifts right/left Gait velocity: decreased     General Gait Details: standing rest breaks between ambulation trials due to fatigue, noted ability to converse during first two ambulation distances but not the third due to increased fatigue and requiring a seated rest break. MinA to minG for safety and balance with mild cueing for RW management around obstacles in busy hallway   Stairs  Wheelchair Mobility     Tilt Bed    Modified Rankin (Stroke Patients Only)       Balance  Overall balance assessment: Needs assistance Sitting-balance support: No upper extremity supported, Feet supported Sitting balance-Leahy Scale: Fair     Standing balance support: Bilateral upper extremity supported, During functional activity, Reliant on assistive device for balance Standing balance-Leahy Scale: Poor Standing balance comment: reliant on RW for ambulation                            Cognition Arousal/Alertness: Awake/alert Behavior During Therapy: WFL for tasks assessed/performed Overall Cognitive Status: Impaired/Different from baseline Area of Impairment: Problem solving, Safety/judgement, Following commands                       Following Commands: Follows one step commands consistently, Follows one step commands with increased time Safety/Judgement: Decreased awareness of safety   Problem Solving: Slow processing General Comments: Pt is a a native Swahili speaker but denies interpreter needs, intermittent increased time for processing and command following which may be due to accents, however pt able to express his needs and ask clarifying questions when warranted        Exercises      General Comments General comments (skin integrity, edema, etc.): VSS on room air      Pertinent Vitals/Pain Pain Assessment Pain Assessment: Faces Faces Pain Scale: Hurts a little bit Pain Location: generalized Pain Descriptors / Indicators: Aching, Discomfort Pain Intervention(s): Limited activity within patient's tolerance, Monitored during session, Repositioned    Home Living                          Prior Function            PT Goals (current goals can now be found in the care plan section) Acute Rehab PT Goals Patient Stated Goal: to get stronger PT Goal Formulation: With patient/family Time For Goal Achievement: 09/01/22 Potential to Achieve Goals: Good Progress towards PT goals: Progressing toward goals    Frequency     Min 3X/week      PT Plan Discharge plan needs to be updated    Co-evaluation              AM-PAC PT "6 Clicks" Mobility   Outcome Measure  Help needed turning from your back to your side while in a flat bed without using bedrails?: A Little Help needed moving from lying on your back to sitting on the side of a flat bed without using bedrails?: A Little Help needed moving to and from a bed to a chair (including a wheelchair)?: A Little Help needed standing up from a chair using your arms (e.g., wheelchair or bedside chair)?: A Little Help needed to walk in hospital room?: A Little Help needed climbing 3-5 steps with a railing? : Total 6 Click Score: 16    End of Session Equipment Utilized During Treatment: Gait belt Activity Tolerance: Patient tolerated treatment well Patient left: with call bell/phone within reach;in bed Nurse Communication: Mobility status PT Visit Diagnosis: Other abnormalities of gait and mobility (R26.89);Difficulty in walking, not elsewhere classified (R26.2);Unsteadiness on feet (R26.81);Muscle weakness (generalized) (M62.81)     Time: 1610-9604 PT Time Calculation (min) (ACUTE ONLY): 23 min  Charges:    $Gait Training: 23-37 mins PT General Charges $$ ACUTE PT VISIT: 1 Visit  Lindalou Hose, PT DPT Acute Rehabilitation Services Office [phone]    Leonie Man 08/27/2022, 4:10 PM

## 2022-08-27 NOTE — TOC Progression Note (Addendum)
Transition of Care [facility]) - Progression Note    Patient Details  Name: Tyler [name] MRN: [id number] Date of Birth: April 03, 1950  [facility] CM/SW Contact  Mearl Latin, LCSW Phone Number: 08/27/2022, 10:28 AM  Clinical Narrative:    10:28 AM Patient's daughter is touring SNFs today, she had planned on going Thursday but CSW made her aware that patient is stable for discharge so a choice needs to be made today so insurance can be started.    2:33 PM Patient's daughter contacted CSW and stated they preferred [facility]. CSW confirmed they do have a private room for patient (chemo precautions) and he will have his own toilet.   CSW will begin insurance authorization process once updated therapy note is in.   4:30pm-Insurance authorization started, Certification Number [id number].   Expected Discharge Plan: Skilled Nursing Facility Barriers to Discharge: Continued Medical Work up, English as a second language teacher, SNF Pending bed offer  Expected Discharge Plan and Services In-house Referral: Clinical Social Work                                             Social Determinants of Health (SDOH) Interventions SDOH Screenings   Food Insecurity: No Food Insecurity (07/30/2022)  Housing: Low Risk  (07/30/2022)  Transportation Needs: No Transportation Needs (07/30/2022)  Utilities: Not At Risk (07/30/2022)  Tobacco Use: Low Risk  (08/26/2022)    Readmission Risk Interventions    08/22/2022    3:06 PM 07/30/2022    2:44 PM  Readmission Risk Prevention Plan  Transportation Screening Complete Complete  PCP or Specialist Appt within 3-5 Days  Complete  HRI or Home Care Consult  Complete  Social Work Consult for Recovery Care Planning/Counseling  Complete  Palliative Care Screening  Not Applicable  Medication Review Oceanographer) Complete Complete  PCP or Specialist appointment within 3-5 days of discharge Complete   HRI or Home Care Consult Complete   SW  Recovery Care/Counseling Consult Complete   Palliative Care Screening Complete   Skilled Nursing Facility Complete

## 2022-08-28 DIAGNOSIS — K921 Melena: Secondary | ICD-10-CM | POA: Diagnosis not present

## 2022-08-28 DIAGNOSIS — I5030 Unspecified diastolic (congestive) heart failure: Secondary | ICD-10-CM | POA: Diagnosis not present

## 2022-08-28 DIAGNOSIS — M6281 Muscle weakness (generalized): Secondary | ICD-10-CM | POA: Diagnosis not present

## 2022-08-28 DIAGNOSIS — Z5189 Encounter for other specified aftercare: Secondary | ICD-10-CM | POA: Diagnosis not present

## 2022-08-28 DIAGNOSIS — E559 Vitamin D deficiency, unspecified: Secondary | ICD-10-CM | POA: Diagnosis not present

## 2022-08-28 DIAGNOSIS — T451X5D Adverse effect of antineoplastic and immunosuppressive drugs, subsequent encounter: Secondary | ICD-10-CM | POA: Diagnosis not present

## 2022-08-28 DIAGNOSIS — R55 Syncope and collapse: Secondary | ICD-10-CM | POA: Diagnosis not present

## 2022-08-28 DIAGNOSIS — Z7401 Bed confinement status: Secondary | ICD-10-CM | POA: Diagnosis not present

## 2022-08-28 DIAGNOSIS — G9341 Metabolic encephalopathy: Secondary | ICD-10-CM | POA: Diagnosis not present

## 2022-08-28 DIAGNOSIS — C8338 Diffuse large B-cell lymphoma, lymph nodes of multiple sites: Secondary | ICD-10-CM | POA: Diagnosis not present

## 2022-08-28 DIAGNOSIS — D508 Other iron deficiency anemias: Secondary | ICD-10-CM | POA: Diagnosis not present

## 2022-08-28 DIAGNOSIS — K253 Acute gastric ulcer without hemorrhage or perforation: Secondary | ICD-10-CM | POA: Diagnosis not present

## 2022-08-28 DIAGNOSIS — R6521 Severe sepsis with septic shock: Secondary | ICD-10-CM | POA: Diagnosis not present

## 2022-08-28 DIAGNOSIS — C8510 Unspecified B-cell lymphoma, unspecified site: Secondary | ICD-10-CM | POA: Diagnosis not present

## 2022-08-28 DIAGNOSIS — M5432 Sciatica, left side: Secondary | ICD-10-CM | POA: Diagnosis not present

## 2022-08-28 DIAGNOSIS — I5031 Acute diastolic (congestive) heart failure: Secondary | ICD-10-CM | POA: Diagnosis not present

## 2022-08-28 DIAGNOSIS — D696 Thrombocytopenia, unspecified: Secondary | ICD-10-CM | POA: Diagnosis not present

## 2022-08-28 DIAGNOSIS — N390 Urinary tract infection, site not specified: Secondary | ICD-10-CM | POA: Diagnosis not present

## 2022-08-28 DIAGNOSIS — R0902 Hypoxemia: Secondary | ICD-10-CM | POA: Diagnosis not present

## 2022-08-28 DIAGNOSIS — I209 Angina pectoris, unspecified: Secondary | ICD-10-CM | POA: Diagnosis not present

## 2022-08-28 DIAGNOSIS — K219 Gastro-esophageal reflux disease without esophagitis: Secondary | ICD-10-CM | POA: Diagnosis not present

## 2022-08-28 DIAGNOSIS — R531 Weakness: Secondary | ICD-10-CM | POA: Diagnosis not present

## 2022-08-28 DIAGNOSIS — D701 Agranulocytosis secondary to cancer chemotherapy: Secondary | ICD-10-CM | POA: Diagnosis not present

## 2022-08-28 DIAGNOSIS — T17908S Unspecified foreign body in respiratory tract, part unspecified causing other injury, sequela: Secondary | ICD-10-CM | POA: Diagnosis not present

## 2022-08-28 DIAGNOSIS — R41841 Cognitive communication deficit: Secondary | ICD-10-CM | POA: Diagnosis not present

## 2022-08-28 DIAGNOSIS — Z79899 Other long term (current) drug therapy: Secondary | ICD-10-CM | POA: Diagnosis not present

## 2022-08-28 DIAGNOSIS — T451X5A Adverse effect of antineoplastic and immunosuppressive drugs, initial encounter: Secondary | ICD-10-CM | POA: Diagnosis not present

## 2022-08-28 DIAGNOSIS — E883 Tumor lysis syndrome: Secondary | ICD-10-CM | POA: Diagnosis not present

## 2022-08-28 DIAGNOSIS — Z5111 Encounter for antineoplastic chemotherapy: Secondary | ICD-10-CM | POA: Diagnosis not present

## 2022-08-28 DIAGNOSIS — E876 Hypokalemia: Secondary | ICD-10-CM | POA: Diagnosis not present

## 2022-08-28 DIAGNOSIS — M109 Gout, unspecified: Secondary | ICD-10-CM | POA: Diagnosis not present

## 2022-08-28 DIAGNOSIS — J9601 Acute respiratory failure with hypoxia: Secondary | ICD-10-CM | POA: Diagnosis not present

## 2022-08-28 DIAGNOSIS — C61 Malignant neoplasm of prostate: Secondary | ICD-10-CM | POA: Diagnosis not present

## 2022-08-28 DIAGNOSIS — Z743 Need for continuous supervision: Secondary | ICD-10-CM | POA: Diagnosis not present

## 2022-08-28 DIAGNOSIS — R195 Other fecal abnormalities: Secondary | ICD-10-CM | POA: Diagnosis not present

## 2022-08-28 DIAGNOSIS — D469 Myelodysplastic syndrome, unspecified: Secondary | ICD-10-CM | POA: Diagnosis not present

## 2022-08-28 DIAGNOSIS — A419 Sepsis, unspecified organism: Secondary | ICD-10-CM | POA: Diagnosis not present

## 2022-08-28 DIAGNOSIS — R1312 Dysphagia, oropharyngeal phase: Secondary | ICD-10-CM | POA: Diagnosis not present

## 2022-08-28 DIAGNOSIS — Z5112 Encounter for antineoplastic immunotherapy: Secondary | ICD-10-CM | POA: Diagnosis not present

## 2022-08-28 DIAGNOSIS — D4620 Refractory anemia with excess of blasts, unspecified: Secondary | ICD-10-CM | POA: Diagnosis not present

## 2022-08-28 DIAGNOSIS — I480 Paroxysmal atrial fibrillation: Secondary | ICD-10-CM | POA: Diagnosis not present

## 2022-08-28 DIAGNOSIS — B181 Chronic viral hepatitis B without delta-agent: Secondary | ICD-10-CM | POA: Diagnosis not present

## 2022-08-28 DIAGNOSIS — K250 Acute gastric ulcer with hemorrhage: Secondary | ICD-10-CM | POA: Diagnosis not present

## 2022-08-28 DIAGNOSIS — J9600 Acute respiratory failure, unspecified whether with hypoxia or hypercapnia: Secondary | ICD-10-CM | POA: Diagnosis not present

## 2022-08-28 DIAGNOSIS — R079 Chest pain, unspecified: Secondary | ICD-10-CM | POA: Diagnosis not present

## 2022-08-28 DIAGNOSIS — N170 Acute kidney failure with tubular necrosis: Secondary | ICD-10-CM | POA: Diagnosis not present

## 2022-08-28 DIAGNOSIS — I10 Essential (primary) hypertension: Secondary | ICD-10-CM | POA: Diagnosis not present

## 2022-08-28 DIAGNOSIS — D649 Anemia, unspecified: Secondary | ICD-10-CM | POA: Diagnosis not present

## 2022-08-28 LAB — GLUCOSE, CAPILLARY
Glucose-Capillary: 84 mg/dL (ref 70–99)
Glucose-Capillary: 105 mg/dL — ABNORMAL HIGH (ref 70–99)
Glucose-Capillary: 111 mg/dL — ABNORMAL HIGH (ref 70–99)

## 2022-08-28 LAB — CBC WITH DIFFERENTIAL/PLATELET
Abs Immature Granulocytes: 0.13 10*3/uL — ABNORMAL HIGH (ref 0.00–0.07)
Basophils Absolute: 0.1 10*3/uL (ref 0.0–0.1)
Basophils Relative: 1 %
Eosinophils Absolute: 0.1 10*3/uL (ref 0.0–0.5)
Eosinophils Relative: 1 %
HCT: 23.4 % — ABNORMAL LOW (ref 39.0–52.0)
Hemoglobin: 7.8 g/dL — ABNORMAL LOW (ref 13.0–17.0)
Immature Granulocytes: 2 %
Lymphocytes Relative: 24 %
Lymphs Abs: 1.8 10*3/uL (ref 0.7–4.0)
MCH: 30.6 pg (ref 26.0–34.0)
MCHC: 33.3 g/dL (ref 30.0–36.0)
MCV: 91.8 fL (ref 80.0–100.0)
Monocytes Absolute: 1.2 10*3/uL — ABNORMAL HIGH (ref 0.1–1.0)
Monocytes Relative: 15 %
Neutro Abs: 4.5 10*3/uL (ref 1.7–7.7)
Neutrophils Relative %: 57 %
Platelets: 237 10*3/uL (ref 150–400)
RBC: 2.55 MIL/uL — ABNORMAL LOW (ref 4.22–5.81)
RDW: 14.9 % (ref 11.5–15.5)
WBC: 7.8 10*3/uL (ref 4.0–10.5)
nRBC: 0 % (ref 0.0–0.2)

## 2022-08-28 LAB — BASIC METABOLIC PANEL
Anion gap: 9 (ref 5–15)
BUN: 18 mg/dL (ref 8–23)
CO2: 25 mmol/L (ref 22–32)
Calcium: 7.3 mg/dL — ABNORMAL LOW (ref 8.9–10.3)
Chloride: 105 mmol/L (ref 98–111)
Creatinine, Ser: 2.1 mg/dL — ABNORMAL HIGH (ref 0.61–1.24)
GFR, Estimated: 33 mL/min — ABNORMAL LOW (ref >60)
Glucose, Bld: 88 mg/dL (ref 70–99)
Potassium: 3.2 mmol/L — ABNORMAL LOW (ref 3.5–5.1)
Sodium: 139 mmol/L (ref 135–145)

## 2022-08-28 MED ORDER — OXYCODONE HCL 5 MG PO TABS: 5.0000 mg | ORAL_TABLET | Freq: Four times a day (QID) | ORAL | 0 refills | Status: DC | PRN

## 2022-08-28 MED ORDER — POTASSIUM CHLORIDE CRYS ER 20 MEQ PO TBCR
40.0000 meq | EXTENDED_RELEASE_TABLET | Freq: Once | ORAL | Status: AC
  Administered 2022-08-28: 40 meq via ORAL
  Filled 2022-08-28: qty 2

## 2022-08-28 MED ORDER — HEPARIN SOD (PORK) LOCK FLUSH 100 UNIT/ML IV SOLN
500.0000 [IU] | Freq: Once | INTRAVENOUS | Status: AC
  Administered 2022-08-28: 500 [IU] via INTRAVENOUS
  Filled 2022-08-28: qty 5

## 2022-08-28 MED ORDER — VEMLIDY 25 MG PO TABS: 25.0000 mg | ORAL_TABLET | Freq: Every day | ORAL | Status: DC

## 2022-08-28 MED ORDER — FAMOTIDINE 20 MG PO TABS
40.0000 mg | ORAL_TABLET | Freq: Every day | ORAL | Status: DC
  Administered 2022-08-28: 40 mg via ORAL

## 2022-08-28 MED ORDER — AMIODARONE HCL 200 MG PO TABS: 200.0000 mg | ORAL_TABLET | Freq: Every day | ORAL | Status: DC

## 2022-08-28 MED ORDER — PANTOPRAZOLE SODIUM 40 MG PO TBEC: 40.0000 mg | DELAYED_RELEASE_TABLET | Freq: Two times a day (BID) | ORAL | Status: DC

## 2022-08-28 NOTE — TOC Transition Note (Signed)
Transition of Care [facility]) - CM/SW Discharge Note   Patient Details  Name: Tyler [name] MRN: [id number] Date of Birth: 23-Oct-1950  Transition of Care [facility] CM/SW Contact:  Mearl Latin, LCSW Phone Number: 08/28/2022, 1:44 PM   Clinical Narrative:    Patient will DC to: [facility] Anticipated DC date: 08/28/22 Family notified: Daughter Transport by: Sharin Mons   Per MD patient ready for DC to [facility] RN to call report prior to discharge [phone], room 125B). RN, patient, patient's family, and facility notified of DC. Discharge Summary and FL2 sent to facility. DC packet on chart including signed script. Ambulance transport requested for patient.   CSW will sign off for now as social work intervention is no longer needed. Please consult [place] again if new needs arise.     Final next level of care: Skilled Nursing Facility Barriers to Discharge: Barriers Resolved   Patient Goals and CMS Choice CMS Medicare.gov Compare Post Acute Care list provided to:: Patient Represenative (must comment) Choice offered to / list presented to : Patient, Adult Children  Discharge Placement     Existing PASRR number confirmed : 08/28/22          Patient chooses bed at:  [facility]) Patient to be transferred to facility by: [facility] Name of family member notified: Daughter Patient and family notified of of transfer: 08/28/22  Discharge Plan and Services Additional resources added to the After Visit Summary for   In-house Referral: Clinical Social Work                                   Social Determinants of Health (SDOH) Interventions SDOH Screenings   Food Insecurity: No Food Insecurity (07/30/2022)  Housing: Low Risk  (07/30/2022)  Transportation Needs: No Transportation Needs (07/30/2022)  Utilities: Not At Risk (07/30/2022)  Tobacco Use: Low Risk  (08/26/2022)     Readmission Risk Interventions    08/22/2022    3:06 PM 07/30/2022    2:44 PM  Readmission Risk Prevention  Plan  Transportation Screening Complete Complete  PCP or Specialist Appt within 3-5 Days  Complete  HRI or Home Care Consult  Complete  Social Work Consult for Recovery Care Planning/Counseling  Complete  Palliative Care Screening  Not Applicable  Medication Review Oceanographer) Complete Complete  PCP or Specialist appointment within 3-5 days of discharge Complete   HRI or Home Care Consult Complete   SW Recovery Care/Counseling Consult Complete   Palliative Care Screening Complete   Skilled Nursing Facility Complete

## 2022-08-28 NOTE — Progress Notes (Signed)
Mobility Specialist Progress Note   08/28/22 1200  Mobility  Activity Ambulated with assistance in hallway  Level of Assistance Contact guard assist, steadying assist  Assistive Device Front wheel walker  Distance Ambulated (ft) 500 ft  Activity Response Tolerated well  Mobility Referral Yes  $Mobility charge 1 Mobility  Mobility Specialist Start Time (ACUTE ONLY) 1210  Mobility Specialist Stop Time (ACUTE ONLY) 1235  Mobility Specialist Time Calculation (min) (ACUTE ONLY) 25 min   Post Mobility: 81 HR, 140/78 BP, 95% SpO2 on RA  Patient received in bed agreeable to participate in mobility. Ambulated in hallway w/ a steady gait and no incident. Returned to room w/ slight SOB but SpO2 was stable. Was left back in bed with all needs met, call bell in reach.   Frederico Hamman Mobility Specialist Please contact via SecureChat or  Rehab office at [phone]

## 2022-08-28 NOTE — Discharge Summary (Signed)
PATIENT DETAILS Name: Tyler [name] Age: 72 y.o. Sex: male Date of Birth: 1950-05-12 MRN: [id number]. Admitting Physician: Rometta Emery, MD WUJ:[id number], Mabeline Caras, FNP  Admit Date: 07/29/2022 Discharge date: 08/28/2022  Recommendations for Outpatient Follow-up:  Follow up with PCP in 1-2 weeks Please obtain CMP/CBC in one week Please ensure follow up with Oncology  Admitted From:  Home  Disposition: Skilled nursing facility   Discharge Condition: good  CODE STATUS:   Code Status: Full Code   Diet recommendation:  Diet Order             Diet - low sodium heart healthy           DIET DYS 3 Room service appropriate? Yes; Fluid consistency: Thin  Diet effective now                    Brief Summary: Patient is a 72 y.o.  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  Stabilized in the ICU-and subsequently transferred to the hospitalist service.  See below for further details.   Significant events: 6/03>> admit to [facility]-sepsis/near syncope-presumed aspiration pneumonia.  6/10>> mini CHOP 6/13>> transferred to ICU-worsening renal failure/hyperkalemia-intubated, CRRT started.   6/16>> extubate 6/19>> Rituximab, CRRT 6/22>> started on HD 6/22>>transferred to hospitalist service 6/25>> melanotic stools-GI consulted 6/26>> EGD-nonbleeding gastric ulcer 6/29>> nephrology signed off   Significant studies: 6/3>> CT abdomen/pelvis: No acute process.  Numerous lymph nodes of multiple sizes present throughout the abdomen/pelvis. 6/3>> CT head: No acute intracranial abnormality 6/3>> TTE: EF> 75%. 6/4>> MRI brain: No acute intracranial abnormality 6/4>> CT maxillofacial: Large periapical lucency at the root of the most posterior remaining right maxillary tooth-no  abscess.  Enlarged submandibular lymph nodes. 6/8>> CT chest: Bulky bilateral axillary, hilar, mediastinal lymphadenopathy. 6/11>> renal ultrasound: No obstructive uropathy. 6/28>> right upper extremity Doppler ultrasound: No DVT.   Significant microbiology data: 6/3>> COVID/influenza/RSV PCR: Negative 6/3>> blood culture: Negative 6/5>> blood culture: Negative 6/13>> tracheal aspirate: Normal respiratory flora   Procedures: 6/13-6/16>> ETT 6/26>> EGD: None leading gastric ulcer 7/01>> Port-A-Cath placement by IR.   Consults: PCCM Nephrology GI Infectious disease CIR Cardiology IR   Brief Hospital Course: Acute hypoxic respiratory failure Septic shock secondary to aspiration pneumonia Resolved On room air All cultures negative Completed antibiotics   AKI Multifactorial-but main culprit felt to be tumor lysis syndrome-with some contribution from sepsis physiology Required CRRT and then intermittent HD Renal function continues to improve  Nephrology has signed off-will need outpatient follow-up with nephrology.   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  No evidence of active GI bleeding over the past several days.  Anemia is now felt to be due to underlying malignancy.  Continue to follow CBC periodically. May require transfusion likely in the future-if so-family prefers [facility] transfusion center.  Upper GI bleeding Occurred on 6/25 Thankfully has resolved with supportive care-brown stools yesterday EGD-with nonbleeding gastric ulcer EGD biopsy negative for H.  pylori/dysplasia Continue PPI twice daily  x 12 weeks as recommended by GI   Acute metabolic encephalopathy Secondary to AKI/sepsis Resolved.   PAF Sinus rhythm Amiodarone No AC due to high bleeding risk   History of chronic hepatitis B On Rituxan for lymphoma Dr. Harmon Pier MD-recommended tenofovir while on rituximab.   Debility/deconditioning Oropharyngeal dysphagia Overall improved NG tube discontinued 6/23-tolerating dysphagia 3 diet SNF planned on discharge   Nutrition Status: Nutrition Problem: Increased nutrient needs Etiology: chronic illness (lymphoma) Signs/Symptoms: estimated needs Interventions: MVI     BMI: Estimated body mass index is 29.21 kg/m as calculated from the following:   Height as of this encounter: 5\' 7"  (1.702 m).   Weight as of this encounter: 84.6 kg.    Discharge Diagnoses:  Principal Problem:   Septic shock (HCC) Active Problems:   Paroxysmal atrial fibrillation (HCC)   Normocytic anemia   Diffuse large B-cell lymphoma of lymph nodes of multiple regions (HCC)   Acute renal failure with tubular necrosis (HCC)   Aspiration into airway   Tumor lysis syndrome   Chemotherapy-induced neutropenia (HCC)   Occult GI bleeding   Acute gastric ulcer   Discharge Instructions:  Activity:  As tolerated with Full fall precautions use walker/cane & assistance as needed  Discharge Instructions     Clinic Appointment Request   Complete by: Aug 27, 2022    Contact your oncology clinic or infusion center to schedule this appointment.   Infusion Appointment Request   Complete by: Aug 27, 2022    Contact your oncology clinic or infusion center to schedule this appointment.   Lab Appointment Request   Complete by: Aug 27, 2022    Contact your oncology clinic or infusion center to schedule this appointment.   Care order/instruction   Complete by: As directed    Transfuse Parameters   Diet - low sodium heart healthy   Complete by: As directed     Discharge instructions   Complete by: As directed    Follow with Primary MD  Nechama Guard, FNP in 1-2 weeks  Please follow-up with oncology in the next 1-2 weeks.  Please get a complete blood count and chemistry panel checked by your Primary MD at your next visit, and again as instructed by your Primary MD.  Get Medicines reviewed and adjusted: Please take all your medications with you for your next visit with your Primary MD  Laboratory/radiological data: Please request your Primary MD to go over all hospital tests and procedure/radiological results at the follow up, please ask your Primary MD to get all Hospital records sent to his/her office.  In some cases, they will be blood work, cultures and biopsy results pending at the time of your discharge. Please request that your primary care M.D. follows up on these results.  Also Note the following: If you experience worsening of your admission symptoms, develop shortness of breath, life threatening emergency, suicidal or homicidal thoughts you must seek medical attention immediately by calling 911 or calling your MD immediately  if symptoms less severe.  You must read complete instructions/literature along with all the possible adverse reactions/side effects for all the Medicines you take and that have been prescribed to you. Take any new Medicines after you have completely understood and accpet all the possible adverse reactions/side effects.   Do not drive when taking Pain medications or sleeping medications (Benzodaizepines)  Do not take more than prescribed Pain, Sleep and Anxiety Medications. It is not advisable to combine anxiety,sleep and pain medications without talking with  your primary care practitioner  Special Instructions: If you have smoked or chewed Tobacco  in the last 2 yrs please stop smoking, stop any regular Alcohol  and or any Recreational drug use.  Wear Seat belts while driving.  Please note: You were cared  for by a hospitalist during your hospital stay. Once you are discharged, your primary care physician will handle any further medical issues. Please note that NO REFILLS for any discharge medications will be authorized once you are discharged, as it is imperative that you return to your primary care physician (or establish a relationship with a primary care physician if you do not have one) for your post hospital discharge needs so that they can reassess your need for medications and monitor your lab values.   Increase activity slowly   Complete by: As directed    No dressing needed   Complete by: As directed    TREATMENT CONDITIONS   Complete by: As directed    Patient should have CBC & CMP within 7 days prior to chemotherapy administration. NOTIFY MD IF: ANC < 1500, Hemoglobin < 8, PLT < 100,000,  Total Bili > 1.5, Creatinine > 1.5, ALT or  AST > 80 or if patient has unstable vital signs: Temperature >= 100.57F, SBP > 180 or < 90, RR > 30 or HR > 100.   Type and screen   Complete by: As directed    [facility]    IS Injection Appt Request (15 minutes)   Complete by: Aug 29, 2022    Contact your oncology clinic or infusion center to schedule this appointment.      Allergies as of 08/28/2022       Reactions   Amitriptyline Other (See Comments)   Nightmares, Night sweats, Migraines   Amlodipine Swelling   BILATERAL FEET TO ANKLES   Neurontin [gabapentin] Other (See Comments)   Nightmares; Night sweats; Headache        Medication List     STOP taking these medications    gabapentin 100 MG capsule Commonly known as: NEURONTIN   lisinopril 5 MG tablet Commonly known as: ZESTRIL   metoCLOPramide 10 MG tablet Commonly known as: REGLAN   prochlorperazine 10 MG tablet Commonly known as: COMPAZINE   senna-docusate 8.6-50 MG tablet Commonly known as: Senna S       TAKE these medications    allopurinol 100 MG tablet Commonly known as: ZYLOPRIM TAKE 1  TABLET BY MOUTH TWICE A DAY   amiodarone 200 MG tablet Commonly known as: PACERONE Take 1 tablet (200 mg total) by mouth daily. Start taking on: August 29, 2022   cholecalciferol 25 MCG (1000 UNIT) tablet Commonly known as: VITAMIN D3 Take 2 tablets (2,000 Units total) by mouth daily.   diclofenac Sodium 1 % Gel Commonly known as: VOLTAREN APPLY 4 G TOPICALLY 4 (FOUR) TIMES DAILY. APPLY TO RIGHT LATERAL HIP AND THIGH DOWN TO KNEE What changed:  when to take this reasons to take this additional instructions   folic acid 1 MG tablet Commonly known as: FOLVITE Take 1 mg by mouth daily.   lidocaine-prilocaine cream Commonly known as: EMLA Apply to affected area once What changed:  how much to take how to take this when to take this reasons to take this additional instructions   multivitamin with minerals Tabs tablet Take 1 tablet by mouth daily.   Muscle Rub 10-15 % Crea Apply 1 Application topically daily as needed for muscle pain.  ondansetron 8 MG tablet Commonly known as: Zofran Take 1 tablet (8 mg total) by mouth every 8 (eight) hours as needed for nausea or vomiting. Start on the third day after cyclophosphamide chemotherapy.   oxyCODONE 5 MG immediate release tablet Commonly known as: Oxy IR/ROXICODONE Take 1 tablet (5 mg total) by mouth every 6 (six) hours as needed for severe pain.   pantoprazole 40 MG tablet Commonly known as: Protonix Take 1 tablet (40 mg total) by mouth 2 (two) times daily. For 12 weeks   Vemlidy 25 MG Tabs Generic drug: Tenofovir Alafenamide Fumarate Take 1 tablet (25 mg total) by mouth daily. Start taking on: August 29, 2022   Vitamin B Complex Tabs Take 1 tablet by mouth daily.               Discharge Care Instructions  (From admission, onward)           Start     Ordered   08/28/22 0000  No dressing needed        08/28/22 1230            Contact information for follow-up providers     Ronney Asters, NP  Follow up on 08/30/2022.   Specialty: Cardiology Why: @8 :[facility] follow up Contact information: [address] [place] [address] [phone]         Johney Maine, MD. Schedule an appointment as soon as possible for a visit in 1 week(s).   Specialties: Hematology, Oncology Contact information: [address] [place] [address] [phone]              Contact information for after-discharge care     Destination     [facility] .   Service: Skilled Nursing Contact information: [address] [place] [address] [phone]                    Allergies  Allergen Reactions   Amitriptyline Other (See Comments)    Nightmares, Night sweats, Migraines    Amlodipine Swelling    BILATERAL FEET TO ANKLES   Neurontin [Gabapentin] Other (See Comments)    Nightmares; Night sweats; Headache     Other Procedures/Studies: IR IMAGING GUIDED PORT INSERTION  Result Date: 08/26/2022 INDICATION: 72 year old male with history of lymphoma requiring central venous access for chemotherapy administration. EXAM: IMPLANTED PORT A CATH PLACEMENT WITH ULTRASOUND AND FLUOROSCOPIC GUIDANCE COMPARISON:  None Available. MEDICATIONS: None. ANESTHESIA/SEDATION: Moderate (conscious) sedation was employed during this procedure. A total of Versed 2 mg and Fentanyl 100 mcg was administered intravenously. Moderate Sedation Time: 14 minutes. The patient's level of consciousness and vital signs were monitored continuously by radiology nursing throughout the procedure under my direct supervision. CONTRAST:  None FLUOROSCOPY TIME:  One mGy COMPLICATIONS: None immediate. PROCEDURE: The procedure, risks, benefits, and alternatives were explained to the patient. Questions regarding the procedure were encouraged and answered. The patient understands and consents to the procedure. The right neck and chest were prepped with  chlorhexidine in a sterile fashion, and a sterile drape was applied covering the operative field. Maximum barrier sterile technique with sterile gowns and gloves were used for the procedure. A timeout was performed prior to the initiation of the procedure. Ultrasound was used to examine the jugular vein which was compressible and free of internal echoes. A skin marker was used to demarcate the planned venotomy and port pocket incision sites. Local anesthesia was provided to these sites  and the subcutaneous tunnel track with 1% lidocaine with 1:100,000 epinephrine. A small incision was created at the jugular access site and blunt dissection was performed of the subcutaneous tissues. Under ultrasound guidance, the jugular vein was accessed with a 21 ga micropuncture needle and an 0.018" wire was inserted to the superior vena cava. Real-time ultrasound guidance was utilized for vascular access including the acquisition of a permanent ultrasound image documenting patency of the accessed vessel. A 5 Fr micopuncture set was then used, through which a 0.035" Rosen wire was passed under fluoroscopic guidance into the inferior vena cava. An 8 Fr dilator was then placed over the wire. A subcutaneous port pocket was then created along the upper chest wall utilizing a combination of sharp and blunt dissection. The pocket was irrigated with sterile saline, packed with gauze, and observed for hemorrhage. A single lumen "ISP" sized power injectable port was chosen for placement. The 8 Fr catheter was tunneled from the port pocket site to the venotomy incision. The port was placed in the pocket. The external catheter was trimmed to appropriate length. The dilator was exchanged for an 8 Fr peel-away sheath under fluoroscopic guidance. The catheter was then placed through the sheath and the sheath was removed. Final catheter positioning was confirmed and documented with a fluoroscopic spot radiograph. The port was accessed with a  Huber needle, aspirated, and flushed with heparinized saline. The deep dermal layer of the port pocket incision was closed with interrupted 3-0 Vicryl suture. Dermabond was then placed over the port pocket and neck incisions. The patient tolerated the procedure well without immediate post procedural complication. FINDINGS: After catheter placement, the tip lies within the superior cavoatrial junction. The catheter aspirates and flushes normally and is ready for immediate use. IMPRESSION: Successful placement of a power injectable Port-A-Cath via the right internal jugular vein. The catheter is ready for immediate use. Marliss Coots, MD [facility] Electronically Signed   By: Marliss Coots M.D.   On: 08/26/2022 12:23   VAS [place] UPPER EXTREMITY VENOUS DUPLEX  Result Date: 08/23/2022 UPPER VENOUS STUDY  Patient Name:  KEIN VANRIPER  Date of Exam:   08/23/2022 Medical Rec #: [id number]     Accession #:    [id number] Date of Birth: [birth year]     Patient Gender: M Patient Age:   9 years Exam Location:  [facility] Procedure:      VAS [place] UPPER EXTREMITY VENOUS DUPLEX Referring Phys: Victorino Dike YATES --------------------------------------------------------------------------------  Indications: Swelling Risk Factors: Lymphoma / chemotherapy. Limitations: PICC line and HD catheter. Comparison Study: No previous exams Performing Technologist: Jody Hill RVT, RDMS  Examination Guidelines: A complete evaluation includes B-mode imaging, spectral Doppler, color Doppler, and power Doppler as needed of all accessible portions of each vessel. Bilateral testing is considered an integral part of a complete examination. Limited examinations for reoccurring indications may be performed as noted.  Right Findings: +----------+------------+---------+-----------+----------+--------------+ RIGHT     CompressiblePhasicitySpontaneousProperties   Summary      +----------+------------+---------+-----------+----------+--------------+ IJV                                                 Not visualized +----------+------------+---------+-----------+----------+--------------+ Subclavian    Full       Yes       Yes                             +----------+------------+---------+-----------+----------+--------------+  Axillary      Full       Yes       Yes                             +----------+------------+---------+-----------+----------+--------------+ Brachial      Full       Yes       Yes                             +----------+------------+---------+-----------+----------+--------------+ Radial        Full                                                 +----------+------------+---------+-----------+----------+--------------+ Ulnar         Full                                                 +----------+------------+---------+-----------+----------+--------------+ Cephalic      Full                                                 +----------+------------+---------+-----------+----------+--------------+ Basilic       Full                                                 +----------+------------+---------+-----------+----------+--------------+ Limited visualization of basilic and brachial veins due to PICC line placement. Vessels are patent in areas visualized.  Left Findings: +----------+------------+---------+-----------+----------+--------------------+ LEFT      CompressiblePhasicitySpontaneousProperties      Summary        +----------+------------+---------+-----------+----------+--------------------+ Subclavian               Yes       Yes                   patent by                                                              color/doppler     +----------+------------+---------+-----------+----------+--------------------+  Summary:  Right: No evidence of deep vein thrombosis in the upper  extremity. No evidence of superficial vein thrombosis in the upper extremity. However, unable to visualize the IJV. Subcutaneous edema in area of the forearm.  Left: No evidence of thrombosis in the subclavian.  *See table(s) above for measurements and observations.  Diagnosing physician: Coral Else MD Electronically signed by Coral Else MD on 08/23/2022 at 10:42:26 PM.    Final    DG Abd 1 View  Result Date: 08/16/2022 CLINICAL DATA:  Ileus. EXAM: ABDOMEN - 1 VIEW COMPARISON:  08/08/2022. FINDINGS: The bowel gas pattern is normal. An enteric tube terminates in the stomach. No radio-opaque calculi or  other significant radiographic abnormality are seen. IMPRESSION: Nonobstructive bowel-gas pattern. Electronically Signed   By: Thornell Sartorius M.D.   On: 08/16/2022 04:56   DG CHEST PORT 1 VIEW  Result Date: 08/11/2022 CLINICAL DATA:  [id number] Follow-up exam [id number] EXAM: PORTABLE CHEST 1 VIEW COMPARISON:  August 09, 2022 FINDINGS: The cardiomediastinal silhouette is unchanged and enlarged in contour with widening of the superior paramediastinal stripes.ETT tip terminates 4.8 cm above the carina. Enteric tube tip and side port project over the proximal stomach. RIGHT upper extremity PICC tip terminates over the RIGHT atrium. RIGHT neck CVC tip terminates over the inferior SVC. Layering bilateral pleural effusions. No pneumothorax. Hazy bibasilar opacities most consistent with atelectasis. IMPRESSION: 1. Support apparatus as described above. 2. Layering bilateral pleural effusions with associated atelectasis. Electronically Signed   By: Meda Klinefelter M.D.   On: 08/11/2022 11:20   DG CHEST PORT 1 VIEW  Result Date: 08/09/2022 CLINICAL DATA:  72 year old male with PICC line pulled out slightly. High-grade large B-cell lymphoma. EXAM: PORTABLE CHEST 1 VIEW COMPARISON:  Portable chest 08/08/2022 and earlier. FINDINGS: Portable AP view at 0401 hours. Similar lordotic positioning. Right PICC line tip position at  the right mediastinal contour appears stable from yesterday, cavoatrial junction level when allowing for lower lung volumes. Stable right IJ approach central line. Endotracheal tube tip remains at the level the clavicles. Enteric tube courses to the abdomen, tip not included. Stable bulky mediastinal widening seen on CT 08/03/2022 secondary to malignant appearing lymphadenopathy. Stable cardiac size and mediastinal contours. Ongoing low lung volumes, lower since yesterday. Veiling opacity at the left lung base, probably in part related to layering pleural effusions seen on the CT. Paucity of bowel gas in the upper abdomen. IMPRESSION: 1. Right PICC line tip position appears stable from yesterday, cavoatrial junction level when allowing for lower lung volumes. 2. Other lines and tubes stable. Lower lung volumes, with continued left lung veiling opacity at least in part due to pleural effusion seen recently by CT. 3. Stable bulky mediastinal widening secondary to malignant lymphadenopathy. Electronically Signed   By: Odessa Fleming M.D.   On: 08/09/2022 04:35   DG CHEST PORT 1 VIEW  Result Date: 08/08/2022 CLINICAL DATA:  Status post intubation EXAM: PORTABLE CHEST 1 VIEW COMPARISON:  08/08/2022 FINDINGS: Cardiac shadow is within normal limits. Endotracheal tube, gastric catheter, right PICC and right jugular dialysis catheter are again noted and stable. Persistent widening of the mediastinum is seen consistent with the known adenopathy. Small left effusion is noted. No focal infiltrate is seen. IMPRESSION: Tubes and lines as described above. Overall appearance is stable with widened mediastinum. New small left effusion is seen. Electronically Signed   By: Alcide Clever M.D.   On: 08/08/2022 23:52   DG Chest Port 1 View  Result Date: 08/08/2022 CLINICAL DATA:  Central line placement EXAM: PORTABLE CHEST 1 VIEW COMPARISON:  Chest radiograph 08/05/2022 FINDINGS: The endotracheal tube tip is proximally 3.5 cm from the  carina. The right IJ vascular sheath terminates in the mid SVC. A right upper extremity PICC terminates in the region of the cavoatrial junction. The enteric catheter tip courses off the field of view. The side-hole is in the stomach. The heart size is stable. Widening of the upper mediastinum is unchanged consistent with known lymphadenopathy seen on prior CT. There is no focal consolidation or pulmonary edema. There is no pleural effusion or pneumothorax There is no acute osseous abnormality. IMPRESSION: 1. Support devices as above in appropriate position.  2. Unchanged widening of the mediastinum consistent with known lymphadenopathy. No new or worsening focal airspace disease. Electronically Signed   By: Lesia Hausen M.D.   On: 08/08/2022 14:23   DG Abd Portable 1V  Result Date: 08/08/2022 CLINICAL DATA:  Ileus EXAM: PORTABLE ABDOMEN - 1 VIEW COMPARISON:  One-view abdomen 08/06/2022 FINDINGS: The bowel gas pattern is now normal. No residual distended bowel is present. Side port of the NG tube is in the stomach. No radio-opaque calculi or other significant radiographic abnormality are seen. IMPRESSION: 1. Resolution of ileus. 2. Side port of the NG tube is in the stomach. Electronically Signed   By: Marin Roberts M.D.   On: 08/08/2022 09:31   DG Abd 1 View  Result Date: 08/06/2022 CLINICAL DATA:  Abdominal distension. EXAM: ABDOMEN - 1 VIEW COMPARISON:  Radiograph yesterday FINDINGS: Tip and side port of the enteric tube below the diaphragm in the stomach. No small bowel distension or evidence of obstruction. There is mild gaseous distension of transverse and descending colon. Small volume of formed stool in the right colon. No radiopaque calculi. No acute osseous abnormalities are seen. IMPRESSION: 1. Tip and side port of the enteric tube below the diaphragm in the stomach. 2. Mild gaseous distension of transverse and descending colon which may represent colonic ileus. No small bowel obstruction.  Electronically Signed   By: Narda Rutherford M.D.   On: 08/06/2022 18:14   US RENAL  Result Date: 08/06/2022 CLINICAL DATA:  Acute kidney injury. EXAM: RENAL / URINARY TRACT ULTRASOUND COMPLETE COMPARISON:  CT 07/29/2022 FINDINGS: Right Kidney: Renal measurements: 10 x 5.3 x 6.6 cm = volume: 183 mL. No hydronephrosis. Normal parenchymal echogenicity. 3.6 cm cyst arises from the lower pole. This needs no further imaging follow-up. No visible stone or solid lesion. Additional cyst on prior CT not well seen. Left Kidney: Renal measurements: 10.8 x 5.6 x 5.3 cm = volume: 167 mL. No hydronephrosis. Normal parenchymal echogenicity. 2.9 cm cyst in the lower pole. Some of the smaller cysts on CT are not seen by ultrasound. No visible stone or solid lesion. Bladder: Not seen, may be empty. Other: None. IMPRESSION: No obstructive uropathy. Electronically Signed   By: Narda Rutherford M.D.   On: 08/06/2022 18:12   DG Abd 1 View  Result Date: 08/05/2022 CLINICAL DATA:  Nasogastric tube placement. EXAM: ABDOMEN - 1 VIEW COMPARISON:  August 05, 2022 FINDINGS: A nasogastric tube is seen with its distal tip overlying the expected region of the gastric antrum. A small amount of radiopaque contrast is seen overlying the expected region of the gastric fundus. The bowel gas pattern is normal. No radio-opaque calculi are seen. IMPRESSION: Nasogastric tube positioning, as described above. Electronically Signed   By: Aram Candela M.D.   On: 08/05/2022 19:05   DG Abd 1 View  Result Date: 08/05/2022 CLINICAL DATA:  [id number] Vomiting [id number] EXAM: ABDOMEN - 1 VIEW COMPARISON:  08/01/2022 FINDINGS: The bowel gas pattern is normal. residual oral contrastIn the gastric fundus. No radio-opaque calculi or other significant radiographic abnormality are seen. IMPRESSION: Negative. Electronically Signed   By: Corlis Leak M.D.   On: 08/05/2022 17:02   DG Chest Port 1V same Day  Result Date: 08/05/2022 CLINICAL DATA:  Dyspnea EXAM:  PORTABLE CHEST 1 VIEW COMPARISON:  08/03/2022 FINDINGS: Cardiac shadow is within normal limits. Persistent widening of the superior mediastinum is noted related to lymphadenopathy. Lungs are clear bilaterally. No bony abnormality is noted. IMPRESSION: Persistent lymphadenopathy with mediastinal widening.  No other focal abnormality is seen. Electronically Signed   By: Alcide Clever M.D.   On: 08/05/2022 11:11   [place] EKG SITE RITE  Result Date: 08/05/2022 If Site Rite image not attached, placement could not be confirmed due to current cardiac rhythm.  CT CHEST W CONTRAST  Result Date: 08/03/2022 CLINICAL DATA:  Inpatient. Abnormal chest radiograph. Chest wall mass. * Tracking Code: BO * EXAM: CT CHEST WITH CONTRAST TECHNIQUE: Multidetector CT imaging of the chest was performed during intravenous contrast administration. RADIATION DOSE REDUCTION: This exam was performed according to the departmental dose-optimization program which includes automated exposure control, adjustment of the mA and/or kV according to patient size and/or use of iterative reconstruction technique. CONTRAST:  75mL OMNIPAQUE IOHEXOL 300 MG/ML  SOLN COMPARISON:  Chest radiograph from one day prior. 07/07/2022 chest CT. FINDINGS: Cardiovascular: Normal heart size. No significant pericardial effusion/thickening. Left anterior descending coronary atherosclerosis. Great vessels are normal in course and caliber. No central pulmonary emboli. Mediastinum/Nodes: No significant thyroid nodules. Unremarkable esophagus. Bulky bilateral axillary adenopathy, increased from 07/07/2022. Representative short axis diameter 3.8 cm right axillary node (series 3/image 57), previously 2.7 cm. Bilateral bulky lower neck adenopathy is increased. Representative left lower neck 3.0 cm node (series 3/image 6), previously 1.8 cm. Bulky bilateral mediastinal adenopathy in the prevascular, AP window, right paratracheal and subcarinal chains, increased. Representative 3.0  cm right paratracheal node (series 3/image 53), previously 1.8 cm. Increased bilateral hilar adenopathy. Representative 2.0 cm left hilar node (series 3/image 77), previously 1.2 cm. Lungs/Pleura: No pneumothorax. Small dependent bilateral pleural effusions with associated compressive mild-to-moderate atelectasis in the posterior lower lobes bilaterally, new. No lung masses or significant pulmonary nodules in the aerated portions of the lungs. Upper abdomen: Multiple splenic masses, for example 3.0 cm medially (series 3/image 131), not previously discretely visualized. Bulky upper abdominal lymphadenopathy in the gastrohepatic ligament, porta hepatis, portacaval, aortocaval and left para-aortic chains, increased. Representative 2.1 cm gastrohepatic ligament node (series 3/image 130), previously 1.8 cm. Representative 2.0 cm left para-aortic node (series 3/image 155), previously 1.3 cm. Interval growth of multiple upper omental soft tissue nodules, largest 3.5 x 2.2 cm (series 3/image 153), previously 2.0 x 1.2 cm. Musculoskeletal: No aggressive appearing focal osseous lesions. Moderate thoracic spondylosis. IMPRESSION: 1. Rapid interval progression of bulky bilateral axillary, bilateral hilar, bilateral mediastinal, bilateral lower neck and upper abdominal lymphadenopathy since recent 07/07/2022 chest CT. Multiple new splenic masses. Enlarging upper omental soft tissue nodules. 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 The axillary adenopathy should be amenable to percutaneous biopsy. 2. Small dependent bilateral pleural effusions with associated compressive mild-to-moderate posterior lower lobe atelectasis, new. 3. One vessel coronary atherosclerosis. Electronically Signed   By: Delbert Phenix M.D.   On: 08/03/2022 12:02   DG Chest Port 1V same Day  Result Date: 08/02/2022 CLINICAL DATA:  Dyspnea shortness of  breath EXAM: PORTABLE CHEST 1 VIEW COMPARISON:  Multiple exams, including 07/31/2022 FINDINGS: Substantially widened mediastinum at 14.9 cm presumably due to adenopathy given findings on prior imaging workups. This is stable compared to 07/31/2022 and substantially increased from 05/23/2022, and questionably increased from 07/06/2022 (AP projection on today's exam may be magnify the mediastinum more than prior PA projections and CT imaging). Heart size within normal limits. Hazy indistinct density at the left lung base possibly from atelectasis or pneumonia, minimally improved from previous. IMPRESSION: 1. Substantially widened mediastinum at 14.9 cm, presumably due to adenopathy given previous  chest CT findings. 2. Hazy indistinct density at the left lung base possibly from atelectasis or pneumonia, minimally improved from previous. Electronically Signed   By: Gaylyn Rong M.D.   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 intake, tachycardia and episode of shaking and unresponsiveness then confused thereafter. EEG to evaluate for seizure Level of alertness: Awake AEDs during EEG study: None Technical aspects: This EEG study was done with scalp electrodes positioned according to the 10-20 International system of electrode placement. Electrical activity was reviewed with band pass filter of 1-70Hz , sensitivity of 7 uV/mm, display speed of 33mm/sec with a 60Hz  notched filter applied as appropriate. EEG data were recorded continuously and digitally stored.  Video monitoring was available and reviewed as appropriate. Description: The posterior dominant rhythm consists  of 7.5 Hz activity of moderate voltage (25-35 uV) seen predominantly in posterior head regions, symmetric and reactive to eye opening and eye closing. EEG showed intermittent generalized sharply contoured high amplitude 2-3hz  delta slowing. Hyperventilation and photic stimulation were not performed.   ABNORMALITY - Intermittent slow, generalized IMPRESSION: This study is suggestive of mild diffuse encephalopathy, nonspecific etiology. No seizures or epileptiform discharges were seen throughout the recording.  If suspicion for interictal activity remains a concern, a prolonged study can be considered. Charlsie Quest   DG Abd 1 View  Result Date: 08/01/2022 CLINICAL DATA:  Abdominal distension. EXAM: ABDOMEN - 1 VIEW COMPARISON:  CT AP 07/29/2022 FINDINGS: No dilated loops of large or small bowel identified. Stool noted within the colon. No abnormal abdominal or pelvic calcifications. Visualized osseous structures appear intact. IMPRESSION: Nonobstructive bowel gas pattern. Electronically Signed   By: Signa Kell M.D.   On: 08/01/2022 12:41   DG Chest Port 1 View  Result Date: 07/31/2022 CLINICAL DATA:  Possible aspiration EXAM: PORTABLE CHEST 1 VIEW COMPARISON:  07/29/2022, 07/07/2022 FINDINGS: Cardiac shadow is stable. Widened mediastinum is again identified accentuated by the portable technique. This is consistent with the known mediastinal adenopathy seen on prior CT. Lungs are well aerated bilaterally. Mild bibasilar atelectasis is seen. No sizable effusion is noted. No bony abnormality is seen. IMPRESSION: Widened mediastinum consistent with the known mediastinal adenopathy. Mild bibasilar atelectasis. Electronically Signed   By: Alcide Clever M.D.   On: 07/31/2022 23:06   CT MAXILLOFACIAL WO CONTRAST  Result Date: 07/30/2022 CLINICAL DATA:  Right facial pain EXAM: CT MAXILLOFACIAL WITHOUT CONTRAST TECHNIQUE: Multidetector CT imaging of the maxillofacial structures was performed. Multiplanar CT  image reconstructions were also generated. RADIATION DOSE REDUCTION: This exam was performed according to the departmental dose-optimization program which includes automated exposure control, adjustment of the mA and/or kV according to patient size and/or use of iterative reconstruction technique. COMPARISON:  None Available. FINDINGS: Osseous: No fracture or mandibular dislocation. No destructive process. Large periapical lucency at the root of the most posterior remaining right maxillary 2. Orbits: Negative. No traumatic or inflammatory finding. Sinuses: Right anterior ethmoid and maxillary sinus complete opacification with polypoid contours. Soft tissues: Multiple enlarged submandibular lymph nodes, measuring up to 1.6 cm. No abscess or fluid collection. Limited intracranial: No significant or unexpected finding. IMPRESSION: 1. Large periapical lucency at the  root of the most posterior remaining right maxillary tooth. No associated abscess. 2. Right anterior ethmoid and maxillary sinus complete opacification with polypoid contours, possibly antrochoanal polyp. 3. Multiple enlarged submandibular lymph nodes, measuring up to 1.6 cm, likely reactive. Electronically Signed   By: Deatra Robinson M.D.   On: 07/30/2022 20:38   MR BRAIN W WO CONTRAST  Result Date: 07/30/2022 CLINICAL DATA:  Seizure and headache EXAM: MRI HEAD WITHOUT AND WITH CONTRAST TECHNIQUE: Multiplanar, multiecho pulse sequences of the brain and surrounding structures were obtained without and with intravenous contrast. CONTRAST:  9mL GADAVIST GADOBUTROL 1 MMOL/ML IV SOLN COMPARISON:  None Available. FINDINGS: Brain: No acute infarct, mass effect or extra-axial collection. No acute or chronic hemorrhage. There is multifocal hyperintense T2-weighted signal within the white matter. Parenchymal volume and CSF spaces are normal. The midline structures are normal. There is no abnormal contrast enhancement. Vascular: Major flow voids are preserved. Skull  and upper cervical spine: Normal calvarium and skull base. Visualized upper cervical spine and soft tissues are normal. Sinuses/Orbits:Opacification of the right maxillary and right anterior ethmoid sinuses. Normal orbits. IMPRESSION: 1. No acute intracranial abnormality. 2. Multifocal hyperintense T2-weighted signal within the white matter, most consistent with chronic small vessel ischemia. 3. Polypoid lesion of the right maxillary and ethmoid sinuses, possibly antrochoanal polyp. Nonemergent ENT consultation might be helpful. Electronically Signed   By: Deatra Robinson M.D.   On: 07/30/2022 20:34   CT ABDOMEN PELVIS WO CONTRAST  Result Date: 07/29/2022 CLINICAL DATA:  Acute abdominal pain. EXAM: CT ABDOMEN AND PELVIS WITHOUT CONTRAST TECHNIQUE: Multidetector CT imaging of the abdomen and pelvis was performed following the standard protocol without IV contrast. RADIATION DOSE REDUCTION: This exam was performed according to the departmental dose-optimization program which includes automated exposure control, adjustment of the mA and/or kV according to patient size and/or use of iterative reconstruction technique. COMPARISON:  CT examination dated Jul 15, 2022 FINDINGS: Lower chest: Bibasilar subsegmental atelectasis. Hepatobiliary: No focal liver abnormality is seen. No gallstones, gallbladder wall thickening, or biliary dilatation. Pancreas: Mild generalized pancreatic atrophy. No pancreatic ductal dilatation or surrounding inflammatory changes. Spleen: Normal in size without focal abnormality. Adrenals/Urinary Tract: Adrenal glands are unremarkable. Kidneys are normal, without renal calculi, focal lesion, or hydronephrosis. Bilateral exophytic renal cysts, likely simple cysts. Bladder is unremarkable. Stomach/Bowel: Stomach is within normal limits. Appendix appears normal. Scattered colonic diverticulosis without evidence of acute diverticulitis. No evidence of bowel wall thickening, distention, or inflammatory  changes. Vascular/Lymphatic: No significant vascular findings are present. There numerous lymph nodes of multiple sizes which are para-aortic, mesenteric, porta hepatis as well as multiple lymph nodes about the spleen as well as few lymph nodes in the pelvis. These are not significantly changed since prior examination. Reproductive: Postsurgical changes in the prostate bed. Other: Scattered soft tissue densities in the anterior abdominal wall, which may represent lymph nodes. No abdominopelvic ascites. Musculoskeletal: Compression fracture of the L4 vertebral body is unchanged. No acute osseous abnormality. IMPRESSION: 1. No CT evidence of acute abdominal/pelvic process. 2. Scattered colonic diverticulosis without evidence of acute diverticulitis. 3. Numerous lymph nodes of multiple sizes are present throughout the abdomen and pelvis predominantly in the mesentery and para-aortic region, which are not significantly changed since prior examination. 4. Chronic compression fracture of the L4 vertebral body. Electronically Signed   By: Larose Hires D.O.   On: 07/29/2022 19:26   CT Head Wo Contrast  Result Date: 07/29/2022 CLINICAL DATA:  Seizures, new onset. EXAM: CT HEAD WITHOUT CONTRAST  TECHNIQUE: Contiguous axial images were obtained from the base of the skull through the vertex without intravenous contrast. RADIATION DOSE REDUCTION: This exam was performed according to the departmental dose-optimization program which includes automated exposure control, adjustment of the mA and/or kV according to patient size and/or use of iterative reconstruction technique. COMPARISON:  None Available. FINDINGS: Brain: No evidence of acute infarction, hemorrhage, hydrocephalus, extra-axial collection or mass lesion/mass effect. Vascular: No hyperdense vessel or unexpected calcification. Skull: Normal. Negative for fracture or focal lesion. Sinuses/Orbits: Mucosal thickening of the ethmoid air cells. Other: None. IMPRESSION: 1.  No acute intracranial abnormality. 2. Mucosal thickening of the ethmoid air cells. Electronically Signed   By: Larose Hires D.O.   On: 07/29/2022 19:15   ECHOCARDIOGRAM COMPLETE  Result Date: 07/29/2022    ECHOCARDIOGRAM REPORT   Patient Name:   Tyler [name] Date of Exam: 07/29/2022 Medical Rec #:  [id number]    Height:       67.0 in Accession #:    [id number]   Weight:       185.9 lb Date of Birth:  October 22, 1950    BSA:          1.960 m Patient Age:    72 years     BP:           111/73 mmHg Patient Gender: M            HR:           117 bpm. Exam Location:  Outpatient Procedure: 2D Echo, Color Doppler and Cardiac Doppler Indications:    Chemo  History:        Patient has prior history of Echocardiogram examinations, most                 recent 12/16/2021. Risk Factors:Hypertension. Prostate Cancer,                 Lymphoma, Chemotherapy.  Sonographer:    Milbert Coulter Referring Phys: Johney Maine  Sonographer Comments: Image acquisition challenging due to respiratory motion and Tachycardia. IMPRESSIONS  1. Intracavitary gradient is realted to dynamic function. Left ventricular ejection fraction, by estimation, is >75%. The left ventricle has hyperdynamic function. The left ventricle has no regional wall motion abnormalities. There is mild concentric left ventricular hypertrophy. Left ventricular diastolic parameters were normal.  2. Right ventricular systolic function is hyperdynamic. The right ventricular size is normal. Tricuspid regurgitation signal is inadequate for assessing PA pressure.  3. The mitral valve is normal in structure. No evidence of mitral valve regurgitation.  4. The aortic valve is tricuspid. There is mild calcification of the aortic valve. Aortic valve regurgitation is not visualized. No aortic stenosis is present. Comparison(s): No significant change from prior study. FINDINGS  Left Ventricle: Intracavitary gradient is realted to dynamic function. Left ventricular ejection fraction, by  estimation, is >75%. The left ventricle has hyperdynamic function. The left ventricle has no regional wall motion abnormalities. The left ventricular internal cavity size was small. There is mild concentric left ventricular hypertrophy. Left ventricular diastolic parameters were normal. Right Ventricle: The right ventricular size is normal. No increase in right ventricular wall thickness. Right ventricular systolic function is hyperdynamic. Tricuspid regurgitation signal is inadequate for assessing PA pressure. Left Atrium: Left atrial size was normal in size. Right Atrium: Right atrial size was normal in size. Pericardium: Trivial pericardial effusion is present. The pericardial effusion is posterior to the left ventricle. Mitral Valve: The mitral valve is normal in structure. No evidence  of mitral valve regurgitation. Tricuspid Valve: The tricuspid valve is normal in structure. Tricuspid valve regurgitation is not demonstrated. Aortic Valve: The aortic valve is tricuspid. There is mild calcification of the aortic valve. Aortic valve regurgitation is not visualized. No aortic stenosis is present. Aortic valve mean gradient measures 10.0 mmHg. Aortic valve peak gradient measures 18.5 mmHg. Aortic valve area, by VTI measures 2.53 cm. Pulmonic Valve: The pulmonic valve was not well visualized. Pulmonic valve regurgitation is not visualized. Aorta: The aortic root and ascending aorta are structurally normal, with no evidence of dilitation. IAS/Shunts: The interatrial septum was not well visualized.  LEFT VENTRICLE PLAX 2D LVIDd:         4.00 cm     Diastology LVIDs:         1.80 cm     LV e' medial:    8.24 cm/s LV PW:         1.10 cm     LV E/e' medial:  7.5 LV IVS:        1.20 cm     LV e' lateral:   11.70 cm/s LVOT diam:     2.10 cm     LV E/e' lateral: 5.3 LV SV:         72 LV SV Index:   37 LVOT Area:     3.46 cm  LV Volumes (MOD) LV vol d, MOD A2C: 27.2 ml LV vol d, MOD A4C: 49.7 ml LV vol s, MOD A2C: 7.1 ml LV  vol s, MOD A4C: 13.6 ml LV SV MOD A2C:     20.1 ml LV SV MOD A4C:     49.7 ml LV SV MOD BP:      26.5 ml RIGHT VENTRICLE TAPSE (M-mode): 2.0 cm LEFT ATRIUM             Index LA diam:        3.50 cm 1.79 cm/m LA Vol (A2C):   26.6 ml 13.57 ml/m LA Vol (A4C):   36.9 ml 18.82 ml/m LA Biplane Vol: 31.3 ml 15.97 ml/m  AORTIC VALVE AV Area (Vmax):    2.77 cm AV Area (Vmean):   2.66 cm AV Area (VTI):     2.53 cm AV Vmax:           215.00 cm/s AV Vmean:          146.000 cm/s AV VTI:            0.285 m AV Peak Grad:      18.5 mmHg AV Mean Grad:      10.0 mmHg LVOT Vmax:         172.00 cm/s LVOT Vmean:        112.000 cm/s LVOT VTI:          0.208 m LVOT/AV VTI ratio: 0.73  AORTA Ao Asc diam: 2.90 cm MITRAL VALVE MV Area (PHT): 4.49 cm    SHUNTS MV Decel Time: 169 msec    Systemic VTI:  0.21 m MV E velocity: 61.90 cm/s  Systemic Diam: 2.10 cm MV A velocity: 89.20 cm/s MV E/A ratio:  0.69 Riley Lam MD Electronically signed by Riley Lam MD Signature Date/Time: 07/29/2022/4:01:01 PM    Final    DG Chest Port 1 View  Result Date: 07/29/2022 CLINICAL DATA:  Cough.  Syncope. EXAM: PORTABLE CHEST 1 VIEW COMPARISON:  May 23, 2022.  Jul 07, 2022. FINDINGS: Prominence of the superior mediastinum is noted consistent with adenopathy as noted on prior  CT scan. Stable cardiac size. Lungs are clear. Bony thorax is unremarkable. IMPRESSION: Widening of superior mediastinum consistent with adenopathy noted on prior CT scan of Jul 07, 2022. This is concerning for lymphoma or metastatic disease. Electronically Signed   By: Lupita Raider M.D.   On: 07/29/2022 15:34     TODAY-DAY OF DISCHARGE:  Subjective:   Tyler [name] today has no headache,no chest abdominal pain,no new weakness tingling or numbness, feels much better wants to go home today.   Objective:   Blood pressure 124/82, pulse 80, temperature 98.4 F (36.9 C), temperature source Oral, resp. rate 15, height 5\' 7"  (1.702 m), weight 84.6 kg,  SpO2 98 %.  Intake/Output Summary (Last 24 hours) at 08/28/2022 1230 Last data filed at 08/28/2022 0700 Gross per 24 hour  Intake 260 ml  Output 2710 ml  Net -2450 ml   Filed Weights   08/19/22 1200 08/20/22 0300 08/21/22 0500  Weight: 79.9 kg 83.9 kg 84.6 kg    Exam: Awake Alert, Oriented *3, No new F.N deficits, Normal affect [place].AT,PERRAL Supple Neck,No JVD, No cervical lymphadenopathy appriciated.  Symmetrical Chest wall movement, Good air movement bilaterally, CTAB RRR,No Gallops,Rubs or new Murmurs, No Parasternal Heave +ve B.Sounds, Abd Soft, Non tender, No organomegaly appriciated, No rebound -guarding or rigidity. No Cyanosis, Clubbing or edema, No new Rash or bruise   PERTINENT RADIOLOGIC STUDIES: No results found.   PERTINENT LAB RESULTS: CBC: Recent Labs    08/27/22 0334 08/28/22 0405  WBC 7.9 7.8  HGB 7.8* 7.8*  HCT 24.3* 23.4*  PLT 277 237   CMET CMP     Component Value Date/Time   NA 139 08/28/2022 0405   K 3.2 (L) 08/28/2022 0405   CL 105 08/28/2022 0405   CO2 25 08/28/2022 0405   GLUCOSE 88 08/28/2022 0405   BUN 18 08/28/2022 0405   CREATININE 2.10 (H) 08/28/2022 0405   CREATININE 0.80 07/19/2022 1432   CALCIUM 7.3 (L) 08/28/2022 0405   PROT 3.9 (L) 08/20/2022 0302   ALBUMIN 1.6 (L) 08/24/2022 0251   AST 25 08/20/2022 0302   AST 31 07/19/2022 1432   ALT 18 08/20/2022 0302   ALT 35 07/19/2022 1432   ALKPHOS 111 08/20/2022 0302   BILITOT 0.8 08/20/2022 0302   BILITOT 1.2 07/19/2022 1432   GFRNONAA 33 (L) 08/28/2022 0405   GFRNONAA >60 07/19/2022 1432    GFR Estimated Creatinine Clearance: 34 mL/min (A) (by C-G formula based on SCr of 2.1 mg/dL (H)). No results for input(s): "LIPASE", "AMYLASE" in the last 72 hours. No results for input(s): "CKTOTAL", "CKMB", "CKMBINDEX", "TROPONINI" in the last 72 hours. Invalid input(s): "POCBNP" No results for input(s): "DDIMER" in the last 72 hours. No results for input(s): "HGBA1C" in the last 72  hours. No results for input(s): "CHOL", "HDL", "LDLCALC", "TRIG", "CHOLHDL", "LDLDIRECT" in the last 72 hours. No results for input(s): "TSH", "T4TOTAL", "T3FREE", "THYROIDAB" in the last 72 hours.  Invalid input(s): "FREET3" No results for input(s): "VITAMINB12", "FOLATE", "FERRITIN", "TIBC", "IRON", "RETICCTPCT" in the last 72 hours. Coags: No results for input(s): "INR" in the last 72 hours.  Invalid input(s): "PT" Microbiology: No results found for this or any previous visit (from the past 240 hour(s)).  FURTHER DISCHARGE INSTRUCTIONS:  Get Medicines reviewed and adjusted: Please take all your medications with you for your next visit with your Primary MD  Laboratory/radiological data: Please request your Primary MD to go over all hospital tests and procedure/radiological results at the follow up,  please ask your Primary MD to get all Hospital records sent to his/her office.  In some cases, they will be blood work, cultures and biopsy results pending at the time of your discharge. Please request that your primary care M.D. goes through all the records of your hospital data and follows up on these results.  Also Note the following: If you experience worsening of your admission symptoms, develop shortness of breath, life threatening emergency, suicidal or homicidal thoughts you must seek medical attention immediately by calling 911 or calling your MD immediately  if symptoms less severe.  You must read complete instructions/literature along with all the possible adverse reactions/side effects for all the Medicines you take and that have been prescribed to you. Take any new Medicines after you have completely understood and accpet all the possible adverse reactions/side effects.   Do not drive when taking Pain medications or sleeping medications (Benzodaizepines)  Do not take more than prescribed Pain, Sleep and Anxiety Medications. It is not advisable to combine anxiety,sleep and pain  medications without talking with your primary care practitioner  Special Instructions: If you have smoked or chewed Tobacco  in the last 2 yrs please stop smoking, stop any regular Alcohol  and or any Recreational drug use.  Wear Seat belts while driving.  Please note: You were cared for by a hospitalist during your hospital stay. Once you are discharged, your primary care physician will handle any further medical issues. Please note that NO REFILLS for any discharge medications will be authorized once you are discharged, as it is imperative that you return to your primary care physician (or establish a relationship with a primary care physician if you do not have one) for your post hospital discharge needs so that they can reassess your need for medications and monitor your lab values.  Total Time spent coordinating discharge including counseling, education and face to face time equals greater than 30 minutes.  SignedJeoffrey Massed 08/28/2022 12:30 PM

## 2022-08-28 NOTE — Progress Notes (Signed)
PROGRESS NOTE        PATIENT DETAILS Name: Tyler [name] Age: 72 y.o. Sex: male Date of Birth: 03-May-1950 Admit Date: 07/29/2022 Admitting Physician Rometta Emery, MD ZOX:[id number], Mabeline Caras, FNP  Brief Summary: Patient is a 72 y.o.  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  Stabilized in the ICU-and subsequently transferred to the hospitalist service.  See below for further details.  Significant events: 6/03>> admit to [facility]-sepsis/near syncope-presumed aspiration pneumonia.  6/10>> mini CHOP 6/13>> transferred to ICU-worsening renal failure/hyperkalemia-intubated, CRRT started.   6/16>> extubate 6/19>> Rituximab, CRRT 6/22>> started on HD 6/22>>transferred to hospitalist service 6/25>> melanotic stools-GI consulted 6/26>> EGD-nonbleeding gastric ulcer 6/29>> nephrology signed off  Significant studies: 6/3>> CT abdomen/pelvis: No acute process.  Numerous lymph nodes of multiple sizes present throughout the abdomen/pelvis. 6/3>> CT head: No acute intracranial abnormality 6/3>> TTE: EF> 75%. 6/4>> MRI brain: No acute intracranial abnormality 6/4>> CT maxillofacial: Large periapical lucency at the root of the most posterior remaining right maxillary tooth-no abscess.  Enlarged submandibular lymph nodes. 6/8>> CT chest: Bulky bilateral axillary, hilar, mediastinal lymphadenopathy. 6/11>> renal ultrasound: No obstructive uropathy. 6/28>> right upper extremity Doppler ultrasound: No DVT.  Significant microbiology data: 6/3>> COVID/influenza/RSV PCR: Negative 6/3>> blood culture: Negative 6/5>> blood culture: Negative 6/13>> tracheal aspirate: Normal respiratory flora  Procedures: 6/13-6/16>> ETT 6/26>> EGD: None leading gastric ulcer 7/01>>  Port-A-Cath placement by IR.  Consults: PCCM Nephrology GI Infectious disease CIR Cardiology IR  Subjective: No acute issues overnight-lying comfortably in bed.  Awaiting SNF bed.  Objective: Vitals: Blood pressure 137/86, pulse 80, temperature 98.4 F (36.9 C), temperature source Oral, resp. rate 20, height 5\' 7"  (1.702 m), weight 84.6 kg, SpO2 98 %.   Exam: Gen Exam:Alert awake-not in any distress HEENT:atraumatic, normocephalic Chest: B/L clear to auscultation anteriorly CVS:S1S2 regular Abdomen:soft non tender, non distended Extremities:no edema Neurology: Non focal Skin: no rash  Pertinent Labs/Radiology:    Latest Ref Rng & Units 08/28/2022    4:05 AM 08/27/2022    3:34 AM 08/26/2022    3:45 AM  CBC  WBC 4.0 - 10.5 K/uL 7.8  7.9  8.5   Hemoglobin 13.0 - 17.0 g/dL 7.8  7.8  6.9   Hematocrit 39.0 - 52.0 % 23.4  24.3  21.4   Platelets 150 - 400 K/uL 237  277  252     Lab Results  Component Value Date   NA 139 08/28/2022   K 3.2 (L) 08/28/2022   CL 105 08/28/2022   CO2 25 08/28/2022      Assessment/Plan: Acute hypoxic respiratory failure Septic shock secondary to aspiration pneumonia Resolved On room air All cultures negative Completed antibiotics  AKI Multifactorial-but main culprit felt to be tumor lysis syndrome-with some contribution from sepsis physiology Required CRRT and then intermittent HD Renal function continues to improve  Nephrology has signed off-will need outpatient follow-up with nephrology.  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  No evidence of active GI bleeding over the  past several days.  Anemia is now felt to be due to underlying malignancy.  Continue to follow CBC periodically.  Upper GI bleeding Occurred on 6/25 Thankfully has resolved with supportive care-brown stools yesterday EGD-with nonbleeding gastric ulcer EGD biopsy negative for H. pylori/dysplasia If bleeding reoccurs-May need bleeding scan. Continue PPI  Acute metabolic encephalopathy Secondary to AKI/sepsis Resolved.  PAF Sinus rhythm Amiodarone No AC due to high bleeding risk  History of chronic hepatitis B On Rituxan for lymphoma Dr. Harmon Pier MD-recommended tenofovir while on rituximab.  Debility/deconditioning Oropharyngeal dysphagia Overall improved NG tube discontinued 6/23-tolerating dysphagia 3 diet SNF planned on discharge  Nutrition Status: Nutrition Problem: Increased nutrient needs Etiology: chronic illness (lymphoma) Signs/Symptoms: estimated needs Interventions: MVI   BMI: Estimated body mass index is 29.21 kg/m as calculated from the following:   Height as of this encounter: 5\' 7"  (1.702 m).   Weight as of this encounter: 84.6 kg.   Code status:   Code Status: Full Code   DVT Prophylaxis: Place and maintain sequential compression device Start: 08/07/22 0936 SCDs Start: 07/30/22 0108   Family Communication: None at bedside   Disposition Plan: Status is: Inpatient Remains inpatient appropriate because: Severity of illness   Planned Discharge Destination:Skilled nursing facility   Diet: Diet Order             DIET DYS 3 Room service appropriate? Yes; Fluid consistency: Thin  Diet effective now                     Antimicrobial agents: Anti-infectives (From admission, onward)    Start     Dose/Rate Route Frequency Ordered Stop   08/21/22 1045  Tenofovir Alafenamide Fumarate TABS 25 mg        25 mg Oral Daily 08/21/22 0947     08/08/22 1500  piperacillin-tazobactam (ZOSYN) IVPB 3.375 g        3.375 g 100 mL/hr over 30 Minutes  Intravenous Every 6 hours 08/08/22 1321 08/13/22 0934   07/30/22 1600  vancomycin (VANCOREADY) IVPB 1750 mg/350 mL  Status:  Discontinued        1,750 mg 175 mL/hr over 120 Minutes Intravenous Every 24 hours 07/29/22 2108 08/01/22 1135   07/30/22 0000  ceFEPIme (MAXIPIME) 2 g in sodium chloride 0.9 % 100 mL IVPB        2 g 200 mL/hr over 30 Minutes Intravenous Every 8 hours 07/29/22 2108 08/05/22 1833   07/29/22 1530  ceFEPIme (MAXIPIME) 2 g in sodium chloride 0.9 % 100 mL IVPB        2 g 200 mL/hr over 30 Minutes Intravenous  Once 07/29/22 1517 07/29/22 1628   07/29/22 1530  metroNIDAZOLE (FLAGYL) IVPB 500 mg        500 mg 100 mL/hr over 60 Minutes Intravenous  Once 07/29/22 1517 07/29/22 1701   07/29/22 1530  vancomycin (VANCOCIN) IVPB 1000 mg/200 mL premix  Status:  Discontinued        1,000 mg 200 mL/hr over 60 Minutes Intravenous  Once 07/29/22 1517 07/29/22 1520   07/29/22 1530  vancomycin (VANCOREADY) IVPB 1750 mg/350 mL        1,750 mg 175 mL/hr over 120 Minutes Intravenous  Once 07/29/22 1520 07/29/22 1908        MEDICATIONS: Scheduled Meds:  sodium chloride   Intravenous Once   acetaminophen  650 mg Per Tube  Once   allopurinol  100 mg Oral BID   amiodarone  200 mg Oral Daily   Chlorhexidine Gluconate Cloth  6 each Topical Q0600   famotidine  40 mg Oral Daily   insulin aspart  0-6 Units Subcutaneous TID WC   multivitamin  1 tablet Oral QHS   Tenofovir Alafenamide Fumarate  25 mg Oral Daily   Continuous Infusions:  sodium chloride Stopped (08/13/22 0021)   sodium chloride 50 mL/hr at 08/28/22 0333   PRN Meds:.docusate, ipratropium-albuterol, lip balm, melatonin, ondansetron **OR** ondansetron (ZOFRAN) IV, oxyCODONE, phenol, polyethylene glycol, senna, sodium chloride flush, traZODone   I have personally reviewed following labs and imaging studies  LABORATORY DATA: CBC: Recent Labs  Lab 08/24/22 0916 08/25/22 0335 08/26/22 0345 08/27/22 0334 08/28/22 0405   WBC 10.1 9.3 8.5 7.9 7.8  NEUTROABS 6.6 6.0 5.5 4.7 4.5  HGB 7.5* 7.5* 6.9* 7.8* 7.8*  HCT 22.8* 22.8* 21.4* 24.3* 23.4*  MCV 91.2 91.9 91.1 92.7 91.8  PLT 259 272 252 277 237     Basic Metabolic Panel: Recent Labs  Lab 08/23/22 0407 08/24/22 0251 08/25/22 0335 08/26/22 0345 08/27/22 0334 08/28/22 0405  NA 136 138 139 139 137 139  K 3.2* 3.4* 3.5 3.4* 3.5 3.2*  CL 104 108 107 105 106 105  CO2 22 23 24 24 24 25   GLUCOSE 88 88 105* 95 90 88  BUN 45* 40* 31* 24* 21 18  CREATININE 3.72* 3.48* 3.25* 2.86* 2.29* 2.10*  CALCIUM 7.4* 7.4* 7.7* 7.4* 7.2* 7.3*  PHOS 5.6* 5.2*  --   --   --   --      GFR: Estimated Creatinine Clearance: 34 mL/min (A) (by C-G formula based on SCr of 2.1 mg/dL (H)).  Liver Function Tests: Recent Labs  Lab 08/23/22 0407 08/24/22 0251  ALBUMIN 1.7* 1.6*    No results for input(s): "LIPASE", "AMYLASE" in the last 168 hours. No results for input(s): "AMMONIA" in the last 168 hours.  Coagulation Profile: No results for input(s): "INR", "PROTIME" in the last 168 hours.  Cardiac Enzymes: No results for input(s): "CKTOTAL", "CKMB", "CKMBINDEX", "TROPONINI" in the last 168 hours.  BNP (last 3 results) No results for input(s): "PROBNP" in the last 8760 hours.  Lipid Profile: No results for input(s): "CHOL", "HDL", "LDLCALC", "TRIG", "CHOLHDL", "LDLDIRECT" in the last 72 hours.  Thyroid Function Tests: No results for input(s): "TSH", "T4TOTAL", "FREET4", "T3FREE", "THYROIDAB" in the last 72 hours.  Anemia Panel: No results for input(s): "VITAMINB12", "FOLATE", "FERRITIN", "TIBC", "IRON", "RETICCTPCT" in the last 72 hours.  Urine analysis:    Component Value Date/Time   COLORURINE AMBER (A) 08/06/2022 0859   APPEARANCEUR TURBID (A) 08/06/2022 0859   LABSPEC 1.023 08/06/2022 0859   PHURINE 5.0 08/06/2022 0859   GLUCOSEU NEGATIVE 08/06/2022 0859   HGBUR MODERATE (A) 08/06/2022 0859   BILIRUBINUR SMALL (A) 08/06/2022 0859   KETONESUR 5  (A) 08/06/2022 0859   PROTEINUR 100 (A) 08/06/2022 0859   NITRITE NEGATIVE 08/06/2022 0859   LEUKOCYTESUR NEGATIVE 08/06/2022 0859    Sepsis Labs: Lactic Acid, Venous    Component Value Date/Time   LATICACIDVEN 2.9 (HH) 08/01/2022 0620    MICROBIOLOGY: No results found for this or any previous visit (from the past 240 hour(s)).  RADIOLOGY STUDIES/RESULTS: IR IMAGING GUIDED PORT INSERTION  Result Date: 08/26/2022 INDICATION: 72 year old male with history of lymphoma requiring central venous access for chemotherapy administration. EXAM: IMPLANTED PORT A CATH PLACEMENT WITH ULTRASOUND AND FLUOROSCOPIC GUIDANCE COMPARISON:  None  Available. MEDICATIONS: None. ANESTHESIA/SEDATION: Moderate (conscious) sedation was employed during this procedure. A total of Versed 2 mg and Fentanyl 100 mcg was administered intravenously. Moderate Sedation Time: 14 minutes. The patient's level of consciousness and vital signs were monitored continuously by radiology nursing throughout the procedure under my direct supervision. CONTRAST:  None FLUOROSCOPY TIME:  One mGy COMPLICATIONS: None immediate. PROCEDURE: The procedure, risks, benefits, and alternatives were explained to the patient. Questions regarding the procedure were encouraged and answered. The patient understands and consents to the procedure. The right neck and chest were prepped with chlorhexidine in a sterile fashion, and a sterile drape was applied covering the operative field. Maximum barrier sterile technique with sterile gowns and gloves were used for the procedure. A timeout was performed prior to the initiation of the procedure. Ultrasound was used to examine the jugular vein which was compressible and free of internal echoes. A skin marker was used to demarcate the planned venotomy and port pocket incision sites. Local anesthesia was provided to these sites and the subcutaneous tunnel track with 1% lidocaine with 1:100,000 epinephrine. A small  incision was created at the jugular access site and blunt dissection was performed of the subcutaneous tissues. Under ultrasound guidance, the jugular vein was accessed with a 21 ga micropuncture needle and an 0.018" wire was inserted to the superior vena cava. Real-time ultrasound guidance was utilized for vascular access including the acquisition of a permanent ultrasound image documenting patency of the accessed vessel. A 5 Fr micopuncture set was then used, through which a 0.035" Rosen wire was passed under fluoroscopic guidance into the inferior vena cava. An 8 Fr dilator was then placed over the wire. A subcutaneous port pocket was then created along the upper chest wall utilizing a combination of sharp and blunt dissection. The pocket was irrigated with sterile saline, packed with gauze, and observed for hemorrhage. A single lumen "ISP" sized power injectable port was chosen for placement. The 8 Fr catheter was tunneled from the port pocket site to the venotomy incision. The port was placed in the pocket. The external catheter was trimmed to appropriate length. The dilator was exchanged for an 8 Fr peel-away sheath under fluoroscopic guidance. The catheter was then placed through the sheath and the sheath was removed. Final catheter positioning was confirmed and documented with a fluoroscopic spot radiograph. The port was accessed with a Huber needle, aspirated, and flushed with heparinized saline. The deep dermal layer of the port pocket incision was closed with interrupted 3-0 Vicryl suture. Dermabond was then placed over the port pocket and neck incisions. The patient tolerated the procedure well without immediate post procedural complication. FINDINGS: After catheter placement, the tip lies within the superior cavoatrial junction. The catheter aspirates and flushes normally and is ready for immediate use. IMPRESSION: Successful placement of a power injectable Port-A-Cath via the right internal jugular  vein. The catheter is ready for immediate use. Marliss Coots, MD [facility] Radiology Electronically Signed   By: Marliss Coots M.D.   On: 08/26/2022 12:23     LOS: 30 days   Jeoffrey Massed, MD  [facility]    To contact the attending provider between 7A-7P or the covering provider during after hours 7P-7A, please log into the web site [email or web address] and access using universal [place] password for that web site. If you do not have the password, please call the hospital operator.  08/28/2022, 10:57 AM

## 2022-08-28 NOTE — TOC Progression Note (Signed)
Transition of Care [facility]) - Progression Note    Patient Details  Name: Javy Tibbits MRN: [id number] Date of Birth: Mar 14, 1950  Transition of Care [facility]) CM/SW Contact  Mearl Latin, LCSW Phone Number: 08/28/2022, 12:42 PM  Clinical Narrative:    Insurance approval received for [facility], Cert# Certification Number [id number], effective until 09/09/22. CSW provided fax number to [facility] for updates (f. [phone]). CSW updated daughter. She is requesting [facility] for transport. CSW made SNF aware that patient should have any needed blood transfusions at [facility] infusion clinic, stated on DC instructions.    Expected Discharge Plan: Skilled Nursing Facility Barriers to Discharge: Barriers Resolved  Expected Discharge Plan and Services In-house Referral: Clinical Social Work       Expected Discharge Date: 08/28/22                                     Social Determinants of Health (SDOH) Interventions SDOH Screenings   Food Insecurity: No Food Insecurity (07/30/2022)  Housing: Low Risk  (07/30/2022)  Transportation Needs: No Transportation Needs (07/30/2022)  Utilities: Not At Risk (07/30/2022)  Tobacco Use: Low Risk  (08/26/2022)    Readmission Risk Interventions    08/22/2022    3:06 PM 07/30/2022    2:44 PM  Readmission Risk Prevention Plan  Transportation Screening Complete Complete  PCP or Specialist Appt within 3-5 Days  Complete  HRI or Home Care Consult  Complete  Social Work Consult for Recovery Care Planning/Counseling  Complete  Palliative Care Screening  Not Applicable  Medication Review [employer]) Complete Complete  PCP or Specialist appointment within 3-5 days of discharge Complete   HRI or Home Care Consult Complete   SW Recovery Care/Counseling Consult Complete   Palliative Care Screening Complete   Skilled Nursing Facility Complete

## 2022-08-30 ENCOUNTER — Encounter: Payer: Self-pay | Admitting: General Practice

## 2022-08-30 ENCOUNTER — Ambulatory Visit: Payer: 59 | Attending: General Practice | Admitting: General Practice

## 2022-08-30 VITALS — BP 126/84 | HR 71 | Ht 67.0 in | Wt 177.2 lb

## 2022-08-30 DIAGNOSIS — E876 Hypokalemia: Secondary | ICD-10-CM | POA: Diagnosis not present

## 2022-08-30 DIAGNOSIS — I480 Paroxysmal atrial fibrillation: Secondary | ICD-10-CM

## 2022-08-30 DIAGNOSIS — R55 Syncope and collapse: Secondary | ICD-10-CM | POA: Diagnosis not present

## 2022-08-30 DIAGNOSIS — D696 Thrombocytopenia, unspecified: Secondary | ICD-10-CM | POA: Diagnosis not present

## 2022-08-30 DIAGNOSIS — D508 Other iron deficiency anemias: Secondary | ICD-10-CM | POA: Diagnosis not present

## 2022-08-30 MED ORDER — AMIODARONE HCL 200 MG PO TABS: 200.0000 mg | ORAL_TABLET | Freq: Every day | ORAL | 1 refills | Status: DC

## 2022-08-30 NOTE — Patient Instructions (Signed)
Medication Instructions:  CONTINUE AMIODARONE *If you need a refill on your cardiac medications before your next appointment, please call your pharmacy*  Lab Work: BMET TODAY If you have labs (blood work) drawn today and your tests are completely normal, you will receive your results only by:  MyChart Message (if you have MyChart) OR  A paper copy in the mail If you have any lab test that is abnormal or we need to change your treatment, we will call you to review the results.  Testing/Procedures: NONE  Follow-Up: At [facility], you and your health needs are our priority.  As part of our continuing mission to provide you with exceptional heart care, we have created designated Provider Care Teams.  These Care Teams include your primary Cardiologist (physician) and Advanced Practice Providers (APPs -  Physician Assistants and Nurse Practitioners) who all work together to provide you with the care you need, when you need it.  Your next appointment:   3-4 month(s)  Provider:   Reatha Harps, MD    Other Instructions   Potassium Content of Foods  Potassium is a mineral found in many foods and drinks. It can affect how the heart works, affect blood pressure, and keep fluids and electrolytes balanced in the body. It is important not to have too much potassium (hyperkalemia) or too little potassium (hypokalemia) in the body, especially in the blood. Potassium is naturally found in many different types of whole foods, such as fruits, vegetables, meat, and dairy products. Processed foods tend to be lower in potassium. The amount of potassium you need each day depends on your age and any medical conditions you may have. General recommendations are: Females aged 74 and older: 2,600 mg per day. Males aged 71 and older: 3,400 mg per day. Talk with your health care provider or dietitian about how much potassium you need. What foods are high in potassium? Below are examples of foods that  have greater than 200 mg of potassium per serving. Fruits Orange -- 1 medium (130 g) has 230 mg of potassium. Banana -- 1 medium (120 g) has 420 mg of potassium. Cantaloupe, chunks -- 1 cup (160 g) has 430 mg of potassium. Vegetables Potato, baked, without skin -- 1 medium (170 g) has 600 mg of potassium. Broccoli, chopped, cooked --  cup (77.5 g) has 230 mg of potassium. Tomato, chopped or sliced -- 1 cup (152 g) has 400 mg of potassium. Grains Cereal, bran with raisins -- 1 cup (59 g) has 360 mg of potassium. Granola with almonds --  cup (82 g) has 220 mg of potassium. Meats and other proteins Ground beef patty -- 4 ounces (113 g) has 240 mg of potassium. Kidney beans, boiled --  cup (130 g) has 350 mg of potassium. Almonds -- 1 ounce (approximately 22 nuts or 28 g) has 200 mg of potassium. Dairy Cow's milk, 1% -- 1 cup (237 mL) has 360 mg of potassium. Plain vanilla low-fat yogurt --  cup (184 g) has 220 mg of potassium. The items listed above may not be a complete list of foods high in potassium. Actual amounts of potassium may be different depending on ripeness, shelf life, and food preparation. Contact a dietitian for more information. What foods are low in potassium? Below are examples of foods that have less than 200 mg of potassium per serving. Fruits Blueberries -- 1 cup (145 g) has 110 mg of potassium. Apple -- 1 medium (140 g) has 145 mg  of potassium. Grapes -- 1 cup (160 g) has 175 mg of potassium. Vegetables Cabbage, raw -- 1 cup (70 g) has 120 mg of potassium. Cauliflower, chopped, cooked -- 1 cup (180 g) has 90 mg of potassium. Romaine lettuce, chopped -- 1 cup (56 g) has 120 mg of potassium. Grains Bagel, plain -- one 4-inch (10 cm) has 100 mg of potassium. Whole wheat bread -- 1 slice (26 g) has 70 mg of potassium. White rice, cooked -- 1 cup (163 g) has 50 mg of potassium. Meats and other proteins Tuna, light, canned in water -- 3 ounces (85 g) has 150 mg of  potassium. Egg, fried -- 1 large (50 g) has 60 mg of potassium. Peanuts --1 ounce (35 nuts or 28 g) has 180 mg of potassium. Tofu --  cup (252 g) has 150 mg of potassium. Dairy Cheese (cheddar, colby, mozzarella, or provolone) -- 1 ounce (28 g) has 30 to 40 mg of potassium. The items listed above may not be a complete list of foods that are low in potassium. Actual amounts of potassium may be different depending on ripeness, shelf life, and food preparation. Contact a dietitian for more information. Summary Potassium is a mineral found in many foods and drinks. It affects how the heart works, affects blood pressure, and keeps fluids and electrolytes balanced in the body. The amount of potassium you need each day depends on your age and any existing medical conditions you may have. Your health care provider or dietitian may recommend an amount of potassium that you should have each day. This information is not intended to replace advice given to you by your health care provider. Make sure you discuss any questions you have with your health care provider. Document Revised: 11/14/2020 Document Reviewed: 10/26/2020 Elsevier Patient Education  2024 [employer].

## 2022-08-31 ENCOUNTER — Encounter: Payer: Self-pay | Admitting: Hematology

## 2022-08-31 ENCOUNTER — Other Ambulatory Visit: Payer: Self-pay

## 2022-08-31 LAB — BASIC METABOLIC PANEL
BUN/Creatinine Ratio: 7 — ABNORMAL LOW (ref 10–24)
BUN: 11 mg/dL (ref 8–27)
CO2: 24 mmol/L (ref 20–29)
Calcium: 7.9 mg/dL — ABNORMAL LOW (ref 8.6–10.2)
Chloride: 100 mmol/L (ref 96–106)
Creatinine, Ser: 1.5 mg/dL — ABNORMAL HIGH (ref 0.76–1.27)
Glucose: 89 mg/dL (ref 70–99)
Potassium: 3.6 mmol/L (ref 3.5–5.2)
Sodium: 140 mmol/L (ref 134–144)
eGFR: 50 mL/min/{1.73_m2} — ABNORMAL LOW (ref >59)

## 2022-09-02 DIAGNOSIS — B181 Chronic viral hepatitis B without delta-agent: Secondary | ICD-10-CM | POA: Diagnosis not present

## 2022-09-02 DIAGNOSIS — E559 Vitamin D deficiency, unspecified: Secondary | ICD-10-CM | POA: Diagnosis not present

## 2022-09-02 DIAGNOSIS — M109 Gout, unspecified: Secondary | ICD-10-CM | POA: Diagnosis not present

## 2022-09-02 DIAGNOSIS — I480 Paroxysmal atrial fibrillation: Secondary | ICD-10-CM | POA: Diagnosis not present

## 2022-09-02 DIAGNOSIS — K219 Gastro-esophageal reflux disease without esophagitis: Secondary | ICD-10-CM | POA: Diagnosis not present

## 2022-09-04 ENCOUNTER — Other Ambulatory Visit: Payer: Self-pay

## 2022-09-04 DIAGNOSIS — C8338 Diffuse large B-cell lymphoma, lymph nodes of multiple sites: Secondary | ICD-10-CM

## 2022-09-04 MED FILL — Dexamethasone Sodium Phosphate Inj 100 MG/10ML: INTRAMUSCULAR | Qty: 1 | Status: AC

## 2022-09-04 MED FILL — Fosaprepitant Dimeglumine For IV Infusion 150 MG (Base Eq): INTRAVENOUS | Qty: 5 | Status: AC

## 2022-09-05 ENCOUNTER — Other Ambulatory Visit: Payer: Self-pay

## 2022-09-05 ENCOUNTER — Inpatient Hospital Stay: Payer: 59 | Attending: Hematology | Admitting: Hematology

## 2022-09-05 ENCOUNTER — Inpatient Hospital Stay: Payer: 59

## 2022-09-05 VITALS — BP 132/96 | HR 78 | Temp 98.8°F | Resp 16 | Wt 175.8 lb

## 2022-09-05 DIAGNOSIS — D4620 Refractory anemia with excess of blasts, unspecified: Secondary | ICD-10-CM | POA: Diagnosis not present

## 2022-09-05 DIAGNOSIS — Z5112 Encounter for antineoplastic immunotherapy: Secondary | ICD-10-CM | POA: Diagnosis not present

## 2022-09-05 DIAGNOSIS — K219 Gastro-esophageal reflux disease without esophagitis: Secondary | ICD-10-CM | POA: Diagnosis not present

## 2022-09-05 DIAGNOSIS — Z5111 Encounter for antineoplastic chemotherapy: Secondary | ICD-10-CM

## 2022-09-05 DIAGNOSIS — C8338 Diffuse large B-cell lymphoma, lymph nodes of multiple sites: Secondary | ICD-10-CM

## 2022-09-05 DIAGNOSIS — M109 Gout, unspecified: Secondary | ICD-10-CM | POA: Diagnosis not present

## 2022-09-05 DIAGNOSIS — C8510 Unspecified B-cell lymphoma, unspecified site: Secondary | ICD-10-CM | POA: Diagnosis not present

## 2022-09-05 DIAGNOSIS — Z95828 Presence of other vascular implants and grafts: Secondary | ICD-10-CM | POA: Insufficient documentation

## 2022-09-05 DIAGNOSIS — D469 Myelodysplastic syndrome, unspecified: Secondary | ICD-10-CM

## 2022-09-05 DIAGNOSIS — M5432 Sciatica, left side: Secondary | ICD-10-CM | POA: Diagnosis not present

## 2022-09-05 DIAGNOSIS — Z79899 Other long term (current) drug therapy: Secondary | ICD-10-CM | POA: Diagnosis not present

## 2022-09-05 DIAGNOSIS — I480 Paroxysmal atrial fibrillation: Secondary | ICD-10-CM | POA: Diagnosis not present

## 2022-09-05 DIAGNOSIS — E559 Vitamin D deficiency, unspecified: Secondary | ICD-10-CM | POA: Diagnosis not present

## 2022-09-05 DIAGNOSIS — Z5189 Encounter for other specified aftercare: Secondary | ICD-10-CM | POA: Insufficient documentation

## 2022-09-05 LAB — CBC WITH DIFFERENTIAL (CANCER CENTER ONLY)
Abs Immature Granulocytes: 0.04 10*3/uL (ref 0.00–0.07)
Basophils Absolute: 0.1 10*3/uL (ref 0.0–0.1)
Basophils Relative: 1 %
Eosinophils Absolute: 0 10*3/uL (ref 0.0–0.5)
Eosinophils Relative: 1 %
HCT: 26.4 % — ABNORMAL LOW (ref 39.0–52.0)
Hemoglobin: 8.7 g/dL — ABNORMAL LOW (ref 13.0–17.0)
Immature Granulocytes: 1 %
Lymphocytes Relative: 31 %
Lymphs Abs: 2.2 10*3/uL (ref 0.7–4.0)
MCH: 30.5 pg (ref 26.0–34.0)
MCHC: 33 g/dL (ref 30.0–36.0)
MCV: 92.6 fL (ref 80.0–100.0)
Monocytes Absolute: 1.1 10*3/uL — ABNORMAL HIGH (ref 0.1–1.0)
Monocytes Relative: 15 %
Neutro Abs: 3.7 10*3/uL (ref 1.7–7.7)
Neutrophils Relative %: 51 %
Platelet Count: 279 10*3/uL (ref 150–400)
RBC: 2.85 MIL/uL — ABNORMAL LOW (ref 4.22–5.81)
RDW: 15.1 % (ref 11.5–15.5)
WBC Count: 7.1 10*3/uL (ref 4.0–10.5)
nRBC: 0 % (ref 0.0–0.2)

## 2022-09-05 LAB — CMP (CANCER CENTER ONLY)
ALT: 16 U/L (ref 0–44)
AST: 19 U/L (ref 15–41)
Albumin: 3 g/dL — ABNORMAL LOW (ref 3.5–5.0)
Alkaline Phosphatase: 163 U/L — ABNORMAL HIGH (ref 38–126)
Anion gap: 10 (ref 5–15)
BUN: 6 mg/dL — ABNORMAL LOW (ref 8–23)
CO2: 26 mmol/L (ref 22–32)
Calcium: 7.8 mg/dL — ABNORMAL LOW (ref 8.9–10.3)
Chloride: 105 mmol/L (ref 98–111)
Creatinine: 1.11 mg/dL (ref 0.61–1.24)
GFR, Estimated: >60 mL/min (ref >60)
Glucose, Bld: 91 mg/dL (ref 70–99)
Potassium: 3.6 mmol/L (ref 3.5–5.1)
Sodium: 141 mmol/L (ref 135–145)
Total Bilirubin: 0.5 mg/dL (ref 0.3–1.2)
Total Protein: 5.7 g/dL — ABNORMAL LOW (ref 6.5–8.1)

## 2022-09-05 LAB — LACTATE DEHYDROGENASE: LDH: 290 U/L — ABNORMAL HIGH (ref 98–192)

## 2022-09-05 LAB — URIC ACID: Uric Acid, Serum: 3 mg/dL — ABNORMAL LOW (ref 3.7–8.6)

## 2022-09-05 MED ORDER — DOXORUBICIN HCL CHEMO IV INJECTION 2 MG/ML
25.0000 mg/m2 | Freq: Once | INTRAVENOUS | Status: AC
  Administered 2022-09-05: 50 mg via INTRAVENOUS
  Filled 2022-09-05: qty 25

## 2022-09-05 MED ORDER — PREDNISONE 50 MG PO TABS: ORAL_TABLET | ORAL | 0 refills | Status: DC

## 2022-09-05 MED ORDER — FUROSEMIDE 20 MG PO TABS: 20.0000 mg | ORAL_TABLET | Freq: Every day | ORAL | 0 refills | Status: DC

## 2022-09-05 MED ORDER — SODIUM CHLORIDE 0.9 % IV SOLN
150.0000 mg | Freq: Once | INTRAVENOUS | Status: AC
  Administered 2022-09-05: 150 mg via INTRAVENOUS
  Filled 2022-09-05: qty 150

## 2022-09-05 MED ORDER — SODIUM CHLORIDE 0.9 % IV SOLN
400.0000 mg/m2 | Freq: Once | INTRAVENOUS | Status: AC
  Administered 2022-09-05: 800 mg via INTRAVENOUS
  Filled 2022-09-05: qty 40

## 2022-09-05 MED ORDER — DIPHENHYDRAMINE HCL 25 MG PO CAPS
50.0000 mg | ORAL_CAPSULE | Freq: Once | ORAL | Status: AC
  Administered 2022-09-05: 50 mg via ORAL
  Filled 2022-09-05: qty 2

## 2022-09-05 MED ORDER — HEPARIN SOD (PORK) LOCK FLUSH 100 UNIT/ML IV SOLN
500.0000 [IU] | Freq: Once | INTRAVENOUS | Status: AC | PRN
  Administered 2022-09-05: 500 [IU]

## 2022-09-05 MED ORDER — SODIUM CHLORIDE 0.9 % IV SOLN
375.0000 mg/m2 | Freq: Once | INTRAVENOUS | Status: AC
  Administered 2022-09-05: 800 mg via INTRAVENOUS
  Filled 2022-09-05: qty 50

## 2022-09-05 MED ORDER — VINCRISTINE SULFATE CHEMO INJECTION 1 MG/ML
1.0000 mg | Freq: Once | INTRAVENOUS | Status: AC
  Administered 2022-09-05: 1 mg via INTRAVENOUS
  Filled 2022-09-05: qty 1

## 2022-09-05 MED ORDER — SODIUM CHLORIDE 0.9 % IV SOLN: Freq: Once | INTRAVENOUS | Status: AC

## 2022-09-05 MED ORDER — POTASSIUM CHLORIDE CRYS ER 20 MEQ PO TBCR: 20.0000 meq | EXTENDED_RELEASE_TABLET | Freq: Two times a day (BID) | ORAL | 0 refills | Status: DC

## 2022-09-05 MED ORDER — SODIUM CHLORIDE 0.9% FLUSH
10.0000 mL | INTRAVENOUS | Status: DC | PRN
  Administered 2022-09-05: 10 mL

## 2022-09-05 MED ORDER — SODIUM CHLORIDE 0.9 % IV SOLN
10.0000 mg | Freq: Once | INTRAVENOUS | Status: AC
  Administered 2022-09-05: 10 mg via INTRAVENOUS
  Filled 2022-09-05: qty 10

## 2022-09-05 MED ORDER — ACETAMINOPHEN 325 MG PO TABS
650.0000 mg | ORAL_TABLET | Freq: Once | ORAL | Status: AC
  Administered 2022-09-05: 650 mg via ORAL
  Filled 2022-09-05: qty 2

## 2022-09-05 MED ORDER — PALONOSETRON HCL INJECTION 0.25 MG/5ML
0.2500 mg | Freq: Once | INTRAVENOUS | Status: AC
  Administered 2022-09-05: 0.25 mg via INTRAVENOUS
  Filled 2022-09-05: qty 5

## 2022-09-05 NOTE — Progress Notes (Signed)
HEMATOLOGY ONCOLOGY CLINIC NOTE  Date of service: 09/05/22    Patient Care Team: Nechama Guard, FNP as PCP - General (Family Medicine) O'Neal, Ronnald Ramp, MD as PCP - Cardiology (Cardiology) Johney Maine, MD as Consulting Physician (Hematology)  Diagnosis: Low grade MDS  Current Treatment: Surveillance, B complex  SUMMARY OF ONCOLOGIC HISTORY: Oncology History  Diffuse large B-cell lymphoma of lymph nodes of multiple regions (HCC)  07/25/2022 Initial Diagnosis   Diffuse large B-cell lymphoma of lymph nodes of multiple regions (HCC)   08/06/2022 -  Chemotherapy   Patient is on Treatment Plan : NON-HODGKINS LYMPHOMA R-CHOP q21d       HISTORY OF PRESENTING ILLNESS: Tyler [name] is a 72 y.o. male with a past medical history of prostate cancer status post prostatectomy in 2021, history of bilateral pulmonary embolism after his surgery in 2021 presenting today for follow up and management of MDS.  He originally presented to the ED on 11/28/21 for low back pain with radiation down his leg. He was diagnosed with sciatica at urgent care prior to the ED visit. He had a negative xray and CT. He returned to the ED after developing a fever and hematuria. His urologist had stopped his Eliquis 3 months earlier. While in the ED on 12/10/21 he was noted to be febrile with blood work showing HGB of 6.2 and a UTI. His stool was heme-negative. He was hospitalized for treatment of his UTI with secondary sepsis.  Today, he states that he has been doing okay since our last visit. He feels that he is getting stronger.   However, he continues to have low back pain which is worsened with bending over and lying down on his back. His pain radiates into his lateral right leg. He has been walking for half an hour or so but notes that his pain worsens with walking in the cold. He has not been working since I last saw him. His back pain has improved somewhat since he stopped working. He is scheduled to  see his PCP next week and plans to discuss referral for orthospine at that time.  He notes some dizziness when he takes some of his medications.  Since his last visit he has completely stopped drinking alcohol and use of Ibuprofen. His appetite has improved. His HGB has continued to improve to 10.1. His platelets have also improved to 74,000. His WBC counts continue to remain WNL. CMP also improved.  He reports having some enlarged lymph nodes in the left anterior cervical region x1 month. He denies any dental problems. He denies any other lumps or bumps.  He reports a recent episode of chills. He denies any recent vaccines. He denies signs of infection such as sore throat, sinus drainage, cough, or urinary symptoms. He denies fevers. He denies nausea, vomiting, chest pain, dyspnea or cough. He denies any bowel changes. His weight has decreased 6 pounds over last 1.5 months .   INTERVAL HISTORY:  Tyler [name] is a 72 y.o. male here for continued evaluation and management of his low grade MDS.   Patient was last seen by me in clinic on 07/19/2022 and complained of worsened sweating, back pain in the right plank, poor p.o. intake, frequent constipation, and abdominal pain limiting his attention and causing sleep disturbances.   He was seen by me as an inpatient on 08/14/2022 and complained of some abdominal pain. Patient was most recently seen by me on 08/19/2022 during a hospital visit and he continued to endorse  multifactorial anemia - lymphoma + AKI + CTx + low grade MDS.   Today, he is here for toxicity check for cycle 2 of R-mini-CHOP. He is accompanied by two of his sons. Patient reports that he is eating better, gradually increasing his weight, and functioning well overall.   He denies any new lumps/bumps, black stools, blood in the stool, cough, or SOB. Patient has normal bowel habits. He does note frequent urination. He is not on any diuretics at this time. He walks independently without the  use of a cane/walker.  He reports that his previous right abdominal pain has improved. Patient complains of bilateral lower extremity swelling. Patient denies any pain from swelling, but does note itchiness in lower extremities which causes discomfort.   REVIEW OF SYSTEMS:    10 Point review of Systems was done is negative except as noted above.   Past Medical History:  Diagnosis Date   Cancer [facility])    prostate   Follicular lymphoma (HCC) 2024   History of pulmonary embolism    Hypertension    Myelodysplastic syndrome (HCC)    PAF (paroxysmal atrial fibrillation) (HCC)    Recurrent UTI    Past Surgical History:  Procedure Laterality Date   BIOPSY  08/21/2022   Procedure: BIOPSY;  Surgeon: Beverley Fiedler, MD;  Location: [facility] ENDOSCOPY;  Service: Gastroenterology;;   ESOPHAGOGASTRODUODENOSCOPY (EGD) WITH PROPOFOL N/A 08/21/2022   Procedure: ESOPHAGOGASTRODUODENOSCOPY (EGD) WITH PROPOFOL;  Surgeon: Beverley Fiedler, MD;  Location: [facility] ENDOSCOPY;  Service: Gastroenterology;  Laterality: N/A;   IR IMAGING GUIDED PORT INSERTION  08/26/2022   LYMPHADENECTOMY Bilateral 09/01/2019   Procedure: LYMPHADENECTOMY;  Surgeon: Sebastian Ache, MD;  Location: [facility] ORS;  Service: Urology;  Laterality: Bilateral;   ROBOT ASSISTED LAPAROSCOPIC RADICAL PROSTATECTOMY N/A 09/01/2019   Procedure: XI ROBOTIC ASSISTED LAPAROSCOPIC RADICAL PROSTATECTOMY;  Surgeon: Sebastian Ache, MD;  Location: [facility] ORS;  Service: Urology;  Laterality: N/A;  3 HRS   Social History   Tobacco Use   Smoking status: Never   Smokeless tobacco: Never  Vaping Use   Vaping status: Never Used  Substance Use Topics   Alcohol use: Yes    Comment: at least one 40 oz beer daily   Drug use: Never    ALLERGIES:  is allergic to amitriptyline, amlodipine, and neurontin [gabapentin].  MEDICATIONS:  Current Outpatient Medications  Medication Sig Dispense Refill   allopurinol (ZYLOPRIM) 100 MG tablet TAKE 1 TABLET BY MOUTH TWICE A DAY 180  tablet 1   amiodarone (PACERONE) 200 MG tablet Take 1 tablet (200 mg total) by mouth daily. 90 tablet 1   B Complex Vitamins (VITAMIN B COMPLEX) TABS Take 1 tablet by mouth daily. 30 tablet 0   cholecalciferol (VITAMIN D3) 25 MCG (1000 UNIT) tablet Take 2 tablets (2,000 Units total) by mouth daily.     diclofenac Sodium (VOLTAREN) 1 % GEL APPLY 4 G TOPICALLY 4 (FOUR) TIMES DAILY. APPLY TO RIGHT LATERAL HIP AND THIGH DOWN TO KNEE (Patient taking differently: Apply 4 g topically daily as needed (For pain).) 300 g 1   folic acid (FOLVITE) 1 MG tablet Take 1 mg by mouth daily.     lidocaine-prilocaine (EMLA) cream Apply to affected area once (Patient taking differently: Apply 1 Application topically daily as needed (For port).) 30 g 3   Menthol-Methyl Salicylate (MUSCLE RUB) 10-15 % CREA Apply 1 Application topically daily as needed for muscle pain.     Multiple Vitamin (MULTIVITAMIN WITH MINERALS) TABS tablet Take 1 tablet  by mouth daily.     ondansetron (ZOFRAN) 8 MG tablet Take 1 tablet (8 mg total) by mouth every 8 (eight) hours as needed for nausea or vomiting. Start on the third day after cyclophosphamide chemotherapy. 30 tablet 1   oxyCODONE (OXY IR/ROXICODONE) 5 MG immediate release tablet Take 1 tablet (5 mg total) by mouth every 6 (six) hours as needed for severe pain. 15 tablet 0   pantoprazole (PROTONIX) 40 MG tablet Take 1 tablet (40 mg total) by mouth 2 (two) times daily. For 12 weeks  \   Tenofovir Alafenamide Fumarate (VEMLIDY) 25 MG TABS Take 1 tablet (25 mg total) by mouth daily. 30 tablet    No current facility-administered medications for this visit.    PHYSICAL EXAMINATION: ECOG PERFORMANCE STATUS: 1 - Symptomatic but completely ambulatory VSS GENERAL:alert, in no acute distress and comfortable SKIN: no acute rashes, no significant lesions EYES: conjunctiva are pink and non-injected, sclera anicteric OROPHARYNX: MMM, no exudates, no oropharyngeal erythema or ulceration NECK:  supple, no JVD LYMPH:  no palpable lymphadenopathy in the cervical, axillary or inguinal regions LUNGS: clear to auscultation b/l with normal respiratory effort HEART: regular rate & rhythm ABDOMEN:  normoactive bowel sounds , non tender, not distended. Extremity: no pedal edema PSYCH: alert & oriented x 3 with fluent speech NEURO: no focal motor/sensory deficits   LABORATORY DATA:   I have reviewed the data as listed .    Latest Ref Rng & Units 09/05/2022    9:22 AM 08/28/2022    4:05 AM 08/27/2022    3:34 AM  CBC  WBC 4.0 - 10.5 K/uL 7.1  7.8  7.9   Hemoglobin 13.0 - 17.0 g/dL 8.7  7.8  7.8   Hematocrit 39.0 - 52.0 % 26.4  23.4  24.3   Platelets 150 - 400 K/uL 279  237  277    .    Latest Ref Rng & Units 09/05/2022    9:22 AM 08/30/2022    8:35 AM 08/28/2022    4:05 AM  CMP  Glucose 70 - 99 mg/dL 91  89  88   BUN 8 - 23 mg/dL 6  11  18    Creatinine 0.61 - 1.24 mg/dL 1.61  0.96  0.45   Sodium 135 - 145 mmol/L 141  140  139   Potassium 3.5 - 5.1 mmol/L 3.6  3.6  3.2   Chloride 98 - 111 mmol/L 105  100  105   CO2 22 - 32 mmol/L 26  24  25    Calcium 8.9 - 10.3 mg/dL 7.8  7.9  7.3   Total Protein 6.5 - 8.1 g/dL 5.7     Total Bilirubin 0.3 - 1.2 mg/dL 0.5     Alkaline Phos 38 - 126 U/L 163     AST 15 - 41 U/L 19     ALT 0 - 44 U/L 16      Lymph node biopsy 07/16/2022    RADIOGRAPHIC STUDIES: I have personally reviewed the radiological images as listed and agreed with the findings in the report. IR IMAGING GUIDED PORT INSERTION  Result Date: 08/26/2022 INDICATION: 72 year old male with history of lymphoma requiring central venous access for chemotherapy administration. EXAM: IMPLANTED PORT A CATH PLACEMENT WITH ULTRASOUND AND FLUOROSCOPIC GUIDANCE COMPARISON:  None Available. MEDICATIONS: None. ANESTHESIA/SEDATION: Moderate (conscious) sedation was employed during this procedure. A total of Versed 2 mg and Fentanyl 100 mcg was administered intravenously. Moderate Sedation Time: 14  minutes. The patient's level of  consciousness and vital signs were monitored continuously by radiology nursing throughout the procedure under my direct supervision. CONTRAST:  None FLUOROSCOPY TIME:  One mGy COMPLICATIONS: None immediate. PROCEDURE: The procedure, risks, benefits, and alternatives were explained to the patient. Questions regarding the procedure were encouraged and answered. The patient understands and consents to the procedure. The right neck and chest were prepped with chlorhexidine in a sterile fashion, and a sterile drape was applied covering the operative field. Maximum barrier sterile technique with sterile gowns and gloves were used for the procedure. A timeout was performed prior to the initiation of the procedure. Ultrasound was used to examine the jugular vein which was compressible and free of internal echoes. A skin marker was used to demarcate the planned venotomy and port pocket incision sites. Local anesthesia was provided to these sites and the subcutaneous tunnel track with 1% lidocaine with 1:100,000 epinephrine. A small incision was created at the jugular access site and blunt dissection was performed of the subcutaneous tissues. Under ultrasound guidance, the jugular vein was accessed with a 21 ga micropuncture needle and an 0.018" wire was inserted to the superior vena cava. Real-time ultrasound guidance was utilized for vascular access including the acquisition of a permanent ultrasound image documenting patency of the accessed vessel. A 5 Fr micopuncture set was then used, through which a 0.035" Rosen wire was passed under fluoroscopic guidance into the inferior vena cava. An 8 Fr dilator was then placed over the wire. A subcutaneous port pocket was then created along the upper chest wall utilizing a combination of sharp and blunt dissection. The pocket was irrigated with sterile saline, packed with gauze, and observed for hemorrhage. A single lumen "ISP" sized power injectable  port was chosen for placement. The 8 Fr catheter was tunneled from the port pocket site to the venotomy incision. The port was placed in the pocket. The external catheter was trimmed to appropriate length. The dilator was exchanged for an 8 Fr peel-away sheath under fluoroscopic guidance. The catheter was then placed through the sheath and the sheath was removed. Final catheter positioning was confirmed and documented with a fluoroscopic spot radiograph. The port was accessed with a Huber needle, aspirated, and flushed with heparinized saline. The deep dermal layer of the port pocket incision was closed with interrupted 3-0 Vicryl suture. Dermabond was then placed over the port pocket and neck incisions. The patient tolerated the procedure well without immediate post procedural complication. FINDINGS: After catheter placement, the tip lies within the superior cavoatrial junction. The catheter aspirates and flushes normally and is ready for immediate use. IMPRESSION: Successful placement of a power injectable Port-A-Cath via the right internal jugular vein. The catheter is ready for immediate use. Marliss Coots, MD [facility] Electronically Signed   By: Marliss Coots M.D.   On: 08/26/2022 12:23   VAS [place] UPPER EXTREMITY VENOUS DUPLEX  Result Date: 08/23/2022 UPPER VENOUS STUDY  Patient Name:  Tyler [name]  Date of Exam:   08/23/2022 Medical Rec #: [id number]     Accession #:    [id number] Date of Birth: 08-12-1950     Patient Gender: M Patient Age:   70 years Exam Location:  [facility] Procedure:      VAS [place] UPPER EXTREMITY VENOUS DUPLEX Referring Phys: Victorino Dike YATES --------------------------------------------------------------------------------  Indications: Swelling Risk Factors: Lymphoma / chemotherapy. Limitations: PICC line and HD catheter. Comparison Study: No previous exams Performing Technologist: Jody Hill RVT, RDMS  Examination  Guidelines:  A complete evaluation includes B-mode imaging, spectral Doppler, color Doppler, and power Doppler as needed of all accessible portions of each vessel. Bilateral testing is considered an integral part of a complete examination. Limited examinations for reoccurring indications may be performed as noted.  Right Findings: +----------+------------+---------+-----------+----------+--------------+ RIGHT     CompressiblePhasicitySpontaneousProperties   Summary     +----------+------------+---------+-----------+----------+--------------+ IJV                                                 Not visualized +----------+------------+---------+-----------+----------+--------------+ Subclavian    Full       Yes       Yes                             +----------+------------+---------+-----------+----------+--------------+ Axillary      Full       Yes       Yes                             +----------+------------+---------+-----------+----------+--------------+ Brachial      Full       Yes       Yes                             +----------+------------+---------+-----------+----------+--------------+ Radial        Full                                                 +----------+------------+---------+-----------+----------+--------------+ Ulnar         Full                                                 +----------+------------+---------+-----------+----------+--------------+ Cephalic      Full                                                 +----------+------------+---------+-----------+----------+--------------+ Basilic       Full                                                 +----------+------------+---------+-----------+----------+--------------+ Limited visualization of basilic and brachial veins due to PICC line placement. Vessels are patent in areas visualized.  Left Findings:  +----------+------------+---------+-----------+----------+--------------------+ LEFT      CompressiblePhasicitySpontaneousProperties      Summary        +----------+------------+---------+-----------+----------+--------------------+ Subclavian               Yes       Yes                   patent by  color/doppler     +----------+------------+---------+-----------+----------+--------------------+  Summary:  Right: No evidence of deep vein thrombosis in the upper extremity. No evidence of superficial vein thrombosis in the upper extremity. However, unable to visualize the IJV. Subcutaneous edema in area of the forearm.  Left: No evidence of thrombosis in the subclavian.  *See table(s) above for measurements and observations.  Diagnosing physician: Coral Else MD Electronically signed by Coral Else MD on 08/23/2022 at 10:42:26 PM.    Final    DG Abd 1 View  Result Date: 08/16/2022 CLINICAL DATA:  Ileus. EXAM: ABDOMEN - 1 VIEW COMPARISON:  08/08/2022. FINDINGS: The bowel gas pattern is normal. An enteric tube terminates in the stomach. No radio-opaque calculi or other significant radiographic abnormality are seen. IMPRESSION: Nonobstructive bowel-gas pattern. Electronically Signed   By: Thornell Sartorius M.D.   On: 08/16/2022 04:56   DG CHEST PORT 1 VIEW  Result Date: 08/11/2022 CLINICAL DATA:  [id number] Follow-up exam [id number] EXAM: PORTABLE CHEST 1 VIEW COMPARISON:  August 09, 2022 FINDINGS: The cardiomediastinal silhouette is unchanged and enlarged in contour with widening of the superior paramediastinal stripes.ETT tip terminates 4.8 cm above the carina. Enteric tube tip and side port project over the proximal stomach. RIGHT upper extremity PICC tip terminates over the RIGHT atrium. RIGHT neck CVC tip terminates over the inferior SVC. Layering bilateral pleural effusions. No pneumothorax. Hazy bibasilar opacities most consistent with  atelectasis. IMPRESSION: 1. Support apparatus as described above. 2. Layering bilateral pleural effusions with associated atelectasis. Electronically Signed   By: Meda Klinefelter M.D.   On: 08/11/2022 11:20   DG CHEST PORT 1 VIEW  Result Date: 08/09/2022 CLINICAL DATA:  72 year old male with PICC line pulled out slightly. High-grade large B-cell lymphoma. EXAM: PORTABLE CHEST 1 VIEW COMPARISON:  Portable chest 08/08/2022 and earlier. FINDINGS: Portable AP view at 0401 hours. Similar lordotic positioning. Right PICC line tip position at the right mediastinal contour appears stable from yesterday, cavoatrial junction level when allowing for lower lung volumes. Stable right IJ approach central line. Endotracheal tube tip remains at the level the clavicles. Enteric tube courses to the abdomen, tip not included. Stable bulky mediastinal widening seen on CT 08/03/2022 secondary to malignant appearing lymphadenopathy. Stable cardiac size and mediastinal contours. Ongoing low lung volumes, lower since yesterday. Veiling opacity at the left lung base, probably in part related to layering pleural effusions seen on the CT. Paucity of bowel gas in the upper abdomen. IMPRESSION: 1. Right PICC line tip position appears stable from yesterday, cavoatrial junction level when allowing for lower lung volumes. 2. Other lines and tubes stable. Lower lung volumes, with continued left lung veiling opacity at least in part due to pleural effusion seen recently by CT. 3. Stable bulky mediastinal widening secondary to malignant lymphadenopathy. Electronically Signed   By: Odessa Fleming M.D.   On: 08/09/2022 04:35   DG CHEST PORT 1 VIEW  Result Date: 08/08/2022 CLINICAL DATA:  Status post intubation EXAM: PORTABLE CHEST 1 VIEW COMPARISON:  08/08/2022 FINDINGS: Cardiac shadow is within normal limits. Endotracheal tube, gastric catheter, right PICC and right jugular dialysis catheter are again noted and stable. Persistent widening of the  mediastinum is seen consistent with the known adenopathy. Small left effusion is noted. No focal infiltrate is seen. IMPRESSION: Tubes and lines as described above. Overall appearance is stable with widened mediastinum. New small left effusion is seen. Electronically Signed   By: Alcide Clever M.D.   On: 08/08/2022 23:52   DG Chest Port 1  View  Result Date: 08/08/2022 CLINICAL DATA:  Central line placement EXAM: PORTABLE CHEST 1 VIEW COMPARISON:  Chest radiograph 08/05/2022 FINDINGS: The endotracheal tube tip is proximally 3.5 cm from the carina. The right IJ vascular sheath terminates in the mid SVC. A right upper extremity PICC terminates in the region of the cavoatrial junction. The enteric catheter tip courses off the field of view. The side-hole is in the stomach. The heart size is stable. Widening of the upper mediastinum is unchanged consistent with known lymphadenopathy seen on prior CT. There is no focal consolidation or pulmonary edema. There is no pleural effusion or pneumothorax There is no acute osseous abnormality. IMPRESSION: 1. Support devices as above in appropriate position. 2. Unchanged widening of the mediastinum consistent with known lymphadenopathy. No new or worsening focal airspace disease. Electronically Signed   By: Lesia Hausen M.D.   On: 08/08/2022 14:23   DG Abd Portable 1V  Result Date: 08/08/2022 CLINICAL DATA:  Ileus EXAM: PORTABLE ABDOMEN - 1 VIEW COMPARISON:  One-view abdomen 08/06/2022 FINDINGS: The bowel gas pattern is now normal. No residual distended bowel is present. Side port of the NG tube is in the stomach. No radio-opaque calculi or other significant radiographic abnormality are seen. IMPRESSION: 1. Resolution of ileus. 2. Side port of the NG tube is in the stomach. Electronically Signed   By: Marin Roberts M.D.   On: 08/08/2022 09:31   DG Abd 1 View  Result Date: 08/06/2022 CLINICAL DATA:  Abdominal distension. EXAM: ABDOMEN - 1 VIEW COMPARISON:   Radiograph yesterday FINDINGS: Tip and side port of the enteric tube below the diaphragm in the stomach. No small bowel distension or evidence of obstruction. There is mild gaseous distension of transverse and descending colon. Small volume of formed stool in the right colon. No radiopaque calculi. No acute osseous abnormalities are seen. IMPRESSION: 1. Tip and side port of the enteric tube below the diaphragm in the stomach. 2. Mild gaseous distension of transverse and descending colon which may represent colonic ileus. No small bowel obstruction. Electronically Signed   By: Narda Rutherford M.D.   On: 08/06/2022 18:14   US RENAL  Result Date: 08/06/2022 CLINICAL DATA:  Acute kidney injury. EXAM: RENAL / URINARY TRACT ULTRASOUND COMPLETE COMPARISON:  CT 07/29/2022 FINDINGS: Right Kidney: Renal measurements: 10 x 5.3 x 6.6 cm = volume: 183 mL. No hydronephrosis. Normal parenchymal echogenicity. 3.6 cm cyst arises from the lower pole. This needs no further imaging follow-up. No visible stone or solid lesion. Additional cyst on prior CT not well seen. Left Kidney: Renal measurements: 10.8 x 5.6 x 5.3 cm = volume: 167 mL. No hydronephrosis. Normal parenchymal echogenicity. 2.9 cm cyst in the lower pole. Some of the smaller cysts on CT are not seen by ultrasound. No visible stone or solid lesion. Bladder: Not seen, may be empty. Other: None. IMPRESSION: No obstructive uropathy. Electronically Signed   By: Narda Rutherford M.D.   On: 08/06/2022 18:12    ASSESSMENT & PLAN:   73 year old male with:  #1 Syncope vs seizure  #2 Paroxysmal A fib-- no a candidate for anticoag due to anemia and thrombocytopenia  #3 Poor po intake with electrolyte issues  #4 Lactic acidosis -- ?sepsis vs hypoperfusion due to hypotension and P afib Though less likely cannot r/o possibility of Type B lactic acidosis from lymphoma "Warburg effect"  #5 High grade large b cell lymphoma  No evidence of double hit or triple hit  lymphoma. Typically would have done  daEPOCH-R but with his r/o MDS high risk of cytopenias esp with etoposide Status post mini CHOP on 08/06/2022.  Expect nadir in  blood counts 10 to 12 days out. Rituxan was held due to significant tumor lysis and multifactorial AKI.  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  # 7 Anemia and mild thrombocytopenia -- likely multifactorial -- lymphoma + MDS+ >?sepsis r/o GI bleeding.  And now due to his mini CHOP chemotherapy.  #8 Abdominal distention with partial obstruction versus ileus.  # 9 Hep B c AB +ve, Hep B SAg neg HepB SAb neg, HBV DNA PCR --neg.  #10 acute kidney injury with sodium level less than 20 possibly related to prerenal causes versus contrast nephropathy versus high risk of tumor lysis syndrome. Renal ultrasound with no obstruction Received 1 dose of rasburicase on 6/12 and 1 dose of rasburicase 6 mg on 6/13. Currently on CRRT.  11 neutropenia from chemotherapy-continues to be on G-CSF  PLAN:  -Discussed lab results on 09/05/2022 in detail with patient. CBC showed WBC of 7.1K, hemoglobin of 8.7, and platelets of 279K. -WBC and platelets normal -patient is anemic. Low-grade MDS may cause bloods counts to take longer to improve. -CMP shows kidney function normalized -LDH improved to 290 from 1100 previously -uric acid level at 3.0 -will order Prednisone -will monitor with labs weekly to ensure that there is no need for transfusion support -if patient is developing anemia, there may be a need to start erythropoietin shots -Continue Allopurinol until cycle 3  -will plan for PET scan after 3 cycles -continue to eat as well as possible to optimize nutrition -recommend patient to regularly use compression socks and elevate legs to improve bilateral lower extremity edema -advised patient to keep lower extremities well moisturized. Discussed that there may be an unusual sensation in lower extremities from swelling -educated patient that IV  fluids were administered to help flush the kidneys -may consider starting a diuretic once kidney levels normalize -continue to stay regularly active -recommend patient to regularly drink at least 2L of water to keep the kidneys flushed -answered all of patient's and his son's questions regarding medication and treatment  FOLLOW-UP: Plz add D3 appointment for G-CSF on 7/13 F/u in 10 days with portflush, labs and appointment with Karena Addison and for 1 unit of PRBC (I am on PAL) Plz schedule C3 of R-mini CHOP with portflush, labs and appointment with Karena Addison MD visit C3 D10 with portlfuhs, labs, appointment for 1 unit of PRBC  The total time spent in the appointment was 32 minutes* .  All of the patient's questions were answered with apparent satisfaction. The patient knows to call the clinic with any problems, questions or concerns.   Wyvonnia Lora MD MS AAHIVMS [facility] Hematology/Oncology Physician [facility]  .*Total Encounter Time as defined by the Centers for Medicare and Medicaid Services includes, in addition to the face-to-face time of a patient visit (documented in the note above) non-face-to-face time: obtaining and reviewing outside history, ordering and reviewing medications, tests or procedures, care coordination (communications with other health care professionals or caregivers) and documentation in the medical record.    I,Mitra Faeizi,acting as a Neurosurgeon for Wyvonnia Lora, MD.,have documented all relevant documentation on the behalf of Wyvonnia Lora, MD,as directed by  Wyvonnia Lora, MD while in the presence of Wyvonnia Lora, MD.  .I have reviewed the above documentation for accuracy and completeness, and I agree with the above. Johney Maine MD

## 2022-09-05 NOTE — Patient Instructions (Signed)
[facility]  Discharge Instructions: Thank you for choosing [facility] to provide your oncology and hematology care.   If you have a lab appointment with the Cancer Center, please go directly to the Cancer Center and check in at the registration area.   Wear comfortable clothing and clothing appropriate for easy access to any Portacath or PICC line.   We strive to give you quality time with your provider. You may need to reschedule your appointment if you arrive late (15 or more minutes).  Arriving late affects you and other patients whose appointments are after yours.  Also, if you miss three or more appointments without notifying the office, you may be dismissed from the clinic at the provider's discretion.      For prescription refill requests, have your pharmacy contact our office and allow 72 hours for refills to be completed.    Today you received the following chemotherapy and/or immunotherapy agents Doxorubicin (Adriamycin), Vincristine (Oncovin), Cyclophosphamide (Cytoxan), Rituximab       To help prevent nausea and vomiting after your treatment, we encourage you to take your nausea medication as directed.  BELOW ARE SYMPTOMS THAT SHOULD BE REPORTED IMMEDIATELY: *FEVER GREATER THAN 100.4 F (38 C) OR HIGHER *CHILLS OR SWEATING *NAUSEA AND VOMITING THAT IS NOT CONTROLLED WITH YOUR NAUSEA MEDICATION *UNUSUAL SHORTNESS OF BREATH *UNUSUAL BRUISING OR BLEEDING *URINARY PROBLEMS (pain or burning when urinating, or frequent urination) *BOWEL PROBLEMS (unusual diarrhea, constipation, pain near the anus) TENDERNESS IN MOUTH AND THROAT WITH OR WITHOUT PRESENCE OF ULCERS (sore throat, sores in mouth, or a toothache) UNUSUAL RASH, SWELLING OR PAIN  UNUSUAL VAGINAL DISCHARGE OR ITCHING   Items with * indicate a potential emergency and should be followed up as soon as possible or go to the Emergency Department if any problems should  occur.  Please show the CHEMOTHERAPY ALERT CARD or IMMUNOTHERAPY ALERT CARD at check-in to the Emergency Department and triage nurse.  Should you have questions after your visit or need to cancel or reschedule your appointment, please contact [facility]  Dept: [phone]  and follow the prompts.  Office hours are 8:00 a.m. to 4:30 p.m. Monday - Friday. Please note that voicemails left after 4:00 p.m. may not be returned until the following business day.  We are closed weekends and major holidays. You have access to a nurse at all times for urgent questions. Please call the main number to the clinic Dept: [phone] and follow the prompts.   For any non-urgent questions, you may also contact your provider using MyChart. We now offer e-Visits for anyone 58 and older to request care online for non-urgent symptoms. For details visit [email or web address].   Also download the MyChart app! Go to the app store, search "MyChart", open the app, select , and log in with your MyChart username and password.  Doxorubicin Injection What is this medication? DOXORUBICIN (dox oh ROO bi sin) treats some types of cancer. It works by slowing down the growth of cancer cells. This medicine may be used for other purposes; ask your health care provider or pharmacist if you have questions. COMMON BRAND NAME(S): Adriamycin, Adriamycin PFS, Adriamycin RDF, Rubex What should I tell my care team before I take this medication? They need to know if you have any of these conditions: Heart disease History of low blood cell levels caused by a medication Liver disease Recent or ongoing radiation An unusual or  allergic reaction to doxorubicin, other medications, foods, dyes, or preservatives If you or your partner are pregnant or trying to get pregnant Breast-feeding How should I use this medication? This medication is injected into a vein. It is given by your care team  in a hospital or clinic setting. Talk to your care team about the use of this medication in children. Special care may be needed. Overdosage: If you think you have taken too much of this medicine contact a poison control center or emergency room at once. NOTE: This medicine is only for you. Do not share this medicine with others. What if I miss a dose? Keep appointments for follow-up doses. It is important not to miss your dose. Call your care team if you are unable to keep an appointment. What may interact with this medication? 6-mercaptopurine Paclitaxel Phenytoin St. John's wort Trastuzumab Verapamil This list may not describe all possible interactions. Give your health care provider a list of all the medicines, herbs, non-prescription drugs, or dietary supplements you use. Also tell them if you smoke, drink alcohol, or use illegal drugs. Some items may interact with your medicine. What should I watch for while using this medication? Your condition will be monitored carefully while you are receiving this medication. You may need blood work while taking this medication. This medication may make you feel generally unwell. This is not uncommon as chemotherapy can affect healthy cells as well as cancer cells. Report any side effects. Continue your course of treatment even though you feel ill unless your care team tells you to stop. There is a maximum amount of this medication you should receive throughout your life. The amount depends on the medical condition being treated and your overall health. Your care team will watch how much of this medication you receive. Tell your care team if you have taken this medication before. Your urine may turn red for a few days after your dose. This is not blood. If your urine is dark or brown, call your care team. In some cases, you may be given additional medications to help with side effects. Follow all directions for their use. This medication may increase  your risk of getting an infection. Call your care team for advice if you get a fever, chills, sore throat, or other symptoms of a cold or flu. Do not treat yourself. Try to avoid being around people who are sick. This medication may increase your risk to bruise or bleed. Call your care team if you notice any unusual bleeding. Talk to your care team about your risk of cancer. You may be more at risk for certain types of cancers if you take this medication. You should make sure that you get enough Coenzyme Q10 while you are taking this medication. Discuss the foods you eat and the vitamins you take with your care team. Talk to your care team if you or your partner may be pregnant. Serious birth defects can occur if you take this medication during pregnancy and for 6 months after the last dose. Contraception is recommended while taking this medication and for 6 months after the last dose. Your care team can help you find the option that works for you. If your partner can get pregnant, use a condom while taking this medication and for 6 months after the last dose. Do not breastfeed while taking this medication. This medication may cause infertility. Talk to your care team if you are concerned about your fertility. What side effects may I  notice from receiving this medication? Side effects that you should report to your care team as soon as possible: Allergic reactions--skin rash, itching, hives, swelling of the face, lips, tongue, or throat Heart failure--shortness of breath, swelling of the ankles, feet, or hands, sudden weight gain, unusual weakness or fatigue Heart rhythm changes--fast or irregular heartbeat, dizziness, feeling faint or lightheaded, chest pain, trouble breathing Infection--fever, chills, cough, sore throat, wounds that don't heal, pain or trouble when passing urine, general feeling of discomfort or being unwell Low red blood cell level--unusual weakness or fatigue, dizziness, headache,  trouble breathing Painful swelling, warmth, or redness of the skin, blisters or sores at the infusion site Unusual bruising or bleeding Side effects that usually do not require medical attention (report to your care team if they continue or are bothersome): Diarrhea Hair loss Nausea Pain, redness, or swelling with sores inside the mouth or throat Red urine This list may not describe all possible side effects. Call your doctor for medical advice about side effects. You may report side effects to FDA at 1-800-FDA-1088. Where should I keep my medication? This medication is given in a hospital or clinic. It will not be stored at home. NOTE: This sheet is a summary. It may not cover all possible information. If you have questions about this medicine, talk to your doctor, pharmacist, or health care provider.  2024 Elsevier/Gold Standard (2021-06-21 00:00:00) Vincristine Injection What is this medication? VINCRISTINE (vin KRIS teen) treats some types of cancer. It works by slowing down the growth of cancer cells. This medicine may be used for other purposes; ask your health care provider or pharmacist if you have questions. COMMON BRAND NAME(S): Oncovin, Vincasar PFS What should I tell my care team before I take this medication? They need to know if you have any of these conditions: Infection Kidney disease Liver disease Low white blood cell levels Lung disease Nervous system disease, such as Charcot-Marie-Tooth (CMT) Recent or ongoing radiation therapy An unusual or allergic reaction to vincristine, other chemotherapy agents, other medications, foods, dyes, or preservatives Pregnant or trying to get pregnant Breast-feeding How should I use this medication? This medication is infused into a vein. It is given by your care team in a hospital or clinic setting. Talk to your care team about the use of this medication in children. While it may be given to children for selected conditions,  precautions do apply. Overdosage: If you think you have taken too much of this medicine contact a poison control center or emergency room at once. NOTE: This medicine is only for you. Do not share this medicine with others. What if I miss a dose? Keep appointments for follow-up doses. It is important not to miss your dose. Call your care team if you are unable to keep an appointment. What may interact with this medication? Do not take this medication with any of the following: Live virus vaccines This medication may also interact with the following: Medications for fungal infections, such as itraconazole or fluconazole Phenytoin Supplements, such as St. John's wort This list may not describe all possible interactions. Give your health care provider a list of all the medicines, herbs, non-prescription drugs, or dietary supplements you use. Also tell them if you smoke, drink alcohol, or use illegal drugs. Some items may interact with your medicine. What should I watch for while using this medication? Your condition will be monitored carefully while you are receiving this medication. This medication may make you feel generally unwell. This is  not uncommon as chemotherapy can affect healthy cells as well as cancer cells. Report any side effects. Continue your course of treatment even though you feel ill unless your care team tells you to stop. You may need blood work while taking this medication. This medication may increase your risk to bruise or bleed. Call your care team if you notice any unusual bleeding. This medication may increase your risk of getting an infection. Call your care team for advice if you get a fever, chills, sore throat, or other symptoms of a cold or flu. Do not treat yourself. Try to avoid being around people who are sick. This medication will cause constipation. If you do not have a bowel movement for 3 days, call your care team. Call your care team if you are around anyone  with measles, chickenpox, or if you develop sores or blisters that do not heal properly. Be careful brushing or flossing your teeth or using a toothpick because you may get an infection or bleed more easily. If you have any dental work done, tell your dentist you are receiving this medication Talk to your care team if you or your partner wish to become pregnant or think either of you might be pregnant. This medication can cause serious birth defects. This medication may cause infertility. Talk to your care team if you are concerned about your fertility. Talk to your care team before breastfeeding. Changes to your treatment plan may be needed. What side effects may I notice from receiving this medication? Side effects that you should report to your care team as soon as possible: Allergic reactions--skin rash, itching, hives, swelling of the face, lips, tongue, or throat High uric acid level--severe pain, redness, warmth, or swelling in joints, pain or trouble passing urine, pain in the lower back or sides Infection--fever, chills, cough, sore throat, wounds that don't heal, pain or trouble when passing urine, general feeling of discomfort or being unwell Pain, tingling, or numbness in the hands or feet, muscle weakness, change in vision, confusion or trouble speaking, loss of balance or coordination, trouble walking, seizures Painful swelling, warmth, or redness of the skin, blisters or sores at the infusion site Shortness of breath or trouble breathing Side effects that usually do not require medical attention (report to your care team if they continue or are bothersome): Constipation Diarrhea Hair loss Loss of appetite Nausea Stomach cramping Vomiting This list may not describe all possible side effects. Call your doctor for medical advice about side effects. You may report side effects to FDA at 1-800-FDA-1088. Where should I keep my medication? This medication is given in a hospital or  clinic. It will not be stored at home. NOTE: This sheet is a summary. It may not cover all possible information. If you have questions about this medicine, talk to your doctor, pharmacist, or health care provider.  2024 Elsevier/Gold Standard (2021-05-08 00:00:00) Cyclophosphamide Capsules or Tablets What is this medication? CYCLOPHOSPHAMIDE (sye kloe FOSS fa mide) treats some types of cancer. It works by slowing down the growth of cancer cells. It may also be used to treat nephrotic syndrome, a type of kidney disease. It works by slowing down an overactive immune system. This medicine may be used for other purposes; ask your health care provider or pharmacist if you have questions. COMMON BRAND NAME(S): Cytoxan What should I tell my care team before I take this medication? They need to know if you have any of these conditions: Heart disease Irregular heartbeat or rhythm  Infection Kidney problems Liver disease Low blood cell levels (white cells, platelets, or red blood cells) Lung disease Previous radiation Trouble when passing urine An unusual or allergic reaction to cyclophosphamide, other medications, foods, dyes, or preservatives Pregnant or trying to get pregnant Breast-feeding How should I use this medication? Take this medication by mouth with water. Take it as directed on the prescription label at the same time every morning. Do not cut, crush, or chew this medication. Swallow the tablets whole. Keep taking it unless your care team tells you to stop. There may be unused or extra doses in the bottle after you finish the dosing cycle. Talk to your care team if you have questions about your dose. Handling this medication may be harmful. Wear gloves while touching the medication or bottle. Talk to your care team about how to handle this medication. Special instructions may apply. Talk to your care team about the use of this medication in children. Special care may be  needed. Overdosage: If you think you have taken too much of this medicine contact a poison control center or emergency room at once. NOTE: This medicine is only for you. Do not share this medicine with others. What if I miss a dose? If you miss a dose, take it as soon as you can. If it is almost time for your next dose, take only that dose. Do not take double or extra doses. What may interact with this medication? Amiodarone Amphotericin B Azathioprine Certain antivirals for HIV or hepatitis Certain medicines for blood pressure, such as enalapril, lisinopril, quinapril Cyclosporine Diuretics Etanercept Indomethacin Medications that relax muscles Metronidazole Natalizumab Tamoxifen Warfarin This list may not describe all possible interactions. Give your health care provider a list of all the medicines, herbs, non-prescription drugs, or dietary supplements you use. Also tell them if you smoke, drink alcohol, or use illegal drugs. Some items may interact with your medicine. What should I watch for while using this medication? This medication may make you feel generally unwell. This is not uncommon as chemotherapy can affect healthy cells as well as cancer cells. Report any side effects. Continue your course of treatment even though you feel ill unless your care team tells you to stop. You may need blood work while you are taking this medication. This medication may increase your risk of getting an infection. Call your care team for advice if you get a fever, chills, sore throat, or other symptoms of a cold or flu. Do not treat yourself. Try to avoid being around people who are sick. Avoid taking medications that contain aspirin, acetaminophen, ibuprofen, naproxen, or ketoprofen unless instructed by your care team. These medications may hide a fever. Be careful brushing or flossing your teeth or using a toothpick because you may get an infection or bleed more easily. If you have any dental work  done, tell your dentist you are receiving this information. Drink water or other fluids as directed. Urinate often, even at night. Talk to your care team if you wish to become pregnant or think you might be pregnant. This medication can cause serious birth defects if taken during pregnancy or for 1 year after stopping therapy. A negative pregnancy test is required before starting this medication. A reliable form of contraception is recommended while taking this medication and for 1 year after stopping therapy. Talk to your care team about reliable contraception. Use a condom during sex and for 4 months after stopping therapy. Tell your care team right away if  you think your partner might be pregnant. This medication can cause serious birth defects. Do not breast-feed while taking this medication or for 1 week after stopping it. This medication may cause infertility. Talk to your care team if you are concerned about your fertility. Talk to your care team about your risk of cancer. You may be more at risk for certain types of cancer if you take this medication. What side effects may I notice from receiving this medication? Side effects that you should report to your care team as soon as possible: Allergic reactions--skin rash, itching, hives, swelling of the face, lips, tongue, or throat Dry cough, shortness of breath or trouble breathing Heart failure--shortness of breath, swelling of the ankles, feet, or hands, sudden weight gain, unusual weakness or fatigue Heart muscle inflammation--unusual weakness or fatigue, shortness of breath, chest pain, fast or irregular heartbeat, dizziness, swelling of the ankles, feet, or hands Heart rhythm changes--fast or irregular heartbeat, dizziness, feeling faint or lightheaded, chest pain, trouble breathing Infection--fever, chills, cough, sore throat, wounds that don't heal, pain or trouble when passing urine, general feeling of discomfort or being unwell Kidney  injury--decrease in the amount of urine, swelling of the ankles, hands, or feet Liver injury--right upper belly pain, loss of appetite, nausea, light-colored stool, dark yellow or brown urine, yellowing skin or eyes, unusual weakness or fatigue Low red blood cell level--unusual weakness or fatigue, dizziness, headache, trouble breathing Low sodium level--muscle weakness, fatigue, dizziness, headache, confusion Red or dark brown urine Unusual bruising or bleeding Side effects that usually do not require medical attention (report to your care team if they continue or are bothersome): Hair loss Irregular menstrual cycles or spotting Loss of appetite Nausea Pain, redness, or swelling with sores inside the mouth or throat Vomiting This list may not describe all possible side effects. Call your doctor for medical advice about side effects. You may report side effects to FDA at 1-800-FDA-1088. Where should I keep my medication? Keep out of the reach of children and pets. Store at room temperature between 20 and 25 degrees C (68 and 77 degrees F). Get rid of any unused medication after the expiration date. To get rid of medications that are no longer needed or have expired: Take the medication to a medication take back program. Check with your pharmacy or local law enforcement to find a location. If you cannot return the medication, ask your pharmacist or care team how to get rid of this medication safely. NOTE: This sheet is a summary. It may not cover all possible information. If you have questions about this medicine, talk to your doctor, pharmacist, or health care provider.  2024 Elsevier/Gold Standard (2021-06-21 00:00:00)  Rituximab Injection What is this medication? RITUXIMAB (ri TUX i mab) treats leukemia and lymphoma. It works by blocking a protein that causes cancer cells to grow and multiply. This helps to slow or stop the spread of cancer cells. It may also be used to treat autoimmune  conditions, such as arthritis. It works by slowing down an overactive immune system. It is a monoclonal antibody. This medicine may be used for other purposes; ask your health care provider or pharmacist if you have questions. COMMON BRAND NAME(S): RIABNI, Rituxan, RUXIENCE, truxima What should I tell my care team before I take this medication? They need to know if you have any of these conditions: Chest pain Heart disease Immune system problems Infection, such as chickenpox, cold sores, hepatitis B, herpes Irregular heartbeat or rhythm Kidney disease  Low blood counts, such as low white cells, platelets, red cells Lung disease Recent or upcoming vaccine An unusual or allergic reaction to rituximab, other medications, foods, dyes, or preservatives Pregnant or trying to get pregnant Breast-feeding How should I use this medication? This medication is injected into a vein. It is given by a care team in a hospital or clinic setting. A special MedGuide will be given to you before each treatment. Be sure to read this information carefully each time. Talk to your care team about the use of this medication in children. While this medication may be prescribed for children as young as 6 months for selected conditions, precautions do apply. Overdosage: If you think you have taken too much of this medicine contact a poison control center or emergency room at once. NOTE: This medicine is only for you. Do not share this medicine with others. What if I miss a dose? Keep appointments for follow-up doses. It is important not to miss your dose. Call your care team if you are unable to keep an appointment. What may interact with this medication? Do not take this medication with any of the following: Live vaccines This medication may also interact with the following: Cisplatin This list may not describe all possible interactions. Give your health care provider a list of all the medicines, herbs,  non-prescription drugs, or dietary supplements you use. Also tell them if you smoke, drink alcohol, or use illegal drugs. Some items may interact with your medicine. What should I watch for while using this medication? Your condition will be monitored carefully while you are receiving this medication. You may need blood work while taking this medication. This medication can cause serious infusion reactions. To reduce the risk your care team may give you other medications to take before receiving this one. Be sure to follow the directions from your care team. This medication may increase your risk of getting an infection. Call your care team for advice if you get a fever, chills, sore throat, or other symptoms of a cold or flu. Do not treat yourself. Try to avoid being around people who are sick. Call your care team if you are around anyone with measles, chickenpox, or if you develop sores or blisters that do not heal properly. Avoid taking medications that contain aspirin, acetaminophen, ibuprofen, naproxen, or ketoprofen unless instructed by your care team. These medications may hide a fever. This medication may cause serious skin reactions. They can happen weeks to months after starting the medication. Contact your care team right away if you notice fevers or flu-like symptoms with a rash. The rash may be red or purple and then turn into blisters or peeling of the skin. You may also notice a red rash with swelling of the face, lips, or lymph nodes in your neck or under your arms. In some patients, this medication may cause a serious brain infection that may cause death. If you have any problems seeing, thinking, speaking, walking, or standing, tell your care team right away. If you cannot reach your care team, urgently seek another source of medical care. Talk to your care team if you may be pregnant. Serious birth defects can occur if you take this medication during pregnancy and for 12 months after the  last dose. You will need a negative pregnancy test before starting this medication. Contraception is recommended while taking this medication and for 12 months after the last dose. Your care team can help you find the option that works  for you. Do not breastfeed while taking this medication and for at least 6 months after the last dose. What side effects may I notice from receiving this medication? Side effects that you should report to your care team as soon as possible: Allergic reactions or angioedema--skin rash, itching or hives, swelling of the face, eyes, lips, tongue, arms, or legs, trouble swallowing or breathing Bowel blockage--stomach cramping, unable to have a bowel movement or pass gas, loss of appetite, vomiting Dizziness, loss of balance or coordination, confusion or trouble speaking Heart attack--pain or tightness in the chest, shoulders, arms, or jaw, nausea, shortness of breath, cold or clammy skin, feeling faint or lightheaded Heart rhythm changes--fast or irregular heartbeat, dizziness, feeling faint or lightheaded, chest pain, trouble breathing Infection--fever, chills, cough, sore throat, wounds that don't heal, pain or trouble when passing urine, general feeling of discomfort or being unwell Infusion reactions--chest pain, shortness of breath or trouble breathing, feeling faint or lightheaded Kidney injury--decrease in the amount of urine, swelling of the ankles, hands, or feet Liver injury--right upper belly pain, loss of appetite, nausea, light-colored stool, dark yellow or brown urine, yellowing skin or eyes, unusual weakness or fatigue Redness, blistering, peeling, or loosening of the skin, including inside the mouth Stomach pain that is severe, does not go away, or gets worse Tumor lysis syndrome (TLS)--nausea, vomiting, diarrhea, decrease in the amount of urine, dark urine, unusual weakness or fatigue, confusion, muscle pain or cramps, fast or irregular heartbeat, joint  pain Side effects that usually do not require medical attention (report to your care team if they continue or are bothersome): Headache Joint pain Nausea Runny or stuffy nose Unusual weakness or fatigue This list may not describe all possible side effects. Call your doctor for medical advice about side effects. You may report side effects to FDA at 1-800-FDA-1088. Where should I keep my medication? This medication is given in a hospital or clinic. It will not be stored at home. NOTE: This sheet is a summary. It may not cover all possible information. If you have questions about this medicine, talk to your doctor, pharmacist, or health care provider.  2024 Elsevier/Gold Standard (2021-07-05 00:00:00)

## 2022-09-07 ENCOUNTER — Inpatient Hospital Stay: Payer: 59

## 2022-09-07 VITALS — BP 141/86 | HR 89 | Temp 97.9°F | Resp 20

## 2022-09-07 DIAGNOSIS — Z5112 Encounter for antineoplastic immunotherapy: Secondary | ICD-10-CM | POA: Diagnosis not present

## 2022-09-07 DIAGNOSIS — Z5111 Encounter for antineoplastic chemotherapy: Secondary | ICD-10-CM

## 2022-09-07 DIAGNOSIS — Z5189 Encounter for other specified aftercare: Secondary | ICD-10-CM | POA: Diagnosis not present

## 2022-09-07 DIAGNOSIS — D4620 Refractory anemia with excess of blasts, unspecified: Secondary | ICD-10-CM | POA: Diagnosis not present

## 2022-09-07 DIAGNOSIS — I480 Paroxysmal atrial fibrillation: Secondary | ICD-10-CM | POA: Diagnosis not present

## 2022-09-07 DIAGNOSIS — Z79899 Other long term (current) drug therapy: Secondary | ICD-10-CM | POA: Diagnosis not present

## 2022-09-07 DIAGNOSIS — C8338 Diffuse large B-cell lymphoma, lymph nodes of multiple sites: Secondary | ICD-10-CM

## 2022-09-07 MED ORDER — PEGFILGRASTIM-JMDB 6 MG/0.6ML ~~LOC~~ SOSY
6.0000 mg | PREFILLED_SYRINGE | Freq: Once | SUBCUTANEOUS | Status: AC
  Administered 2022-09-07: 6 mg via SUBCUTANEOUS

## 2022-09-09 DIAGNOSIS — I480 Paroxysmal atrial fibrillation: Secondary | ICD-10-CM | POA: Diagnosis not present

## 2022-09-09 DIAGNOSIS — M5432 Sciatica, left side: Secondary | ICD-10-CM | POA: Diagnosis not present

## 2022-09-09 DIAGNOSIS — C8510 Unspecified B-cell lymphoma, unspecified site: Secondary | ICD-10-CM | POA: Diagnosis not present

## 2022-09-09 DIAGNOSIS — E559 Vitamin D deficiency, unspecified: Secondary | ICD-10-CM | POA: Diagnosis not present

## 2022-09-09 DIAGNOSIS — K219 Gastro-esophageal reflux disease without esophagitis: Secondary | ICD-10-CM | POA: Diagnosis not present

## 2022-09-09 DIAGNOSIS — B181 Chronic viral hepatitis B without delta-agent: Secondary | ICD-10-CM | POA: Diagnosis not present

## 2022-09-09 DIAGNOSIS — M109 Gout, unspecified: Secondary | ICD-10-CM | POA: Diagnosis not present

## 2022-09-09 DIAGNOSIS — R079 Chest pain, unspecified: Secondary | ICD-10-CM | POA: Diagnosis not present

## 2022-09-11 ENCOUNTER — Encounter: Payer: Self-pay | Admitting: Hematology

## 2022-09-11 DIAGNOSIS — C8510 Unspecified B-cell lymphoma, unspecified site: Secondary | ICD-10-CM | POA: Diagnosis not present

## 2022-09-11 DIAGNOSIS — I209 Angina pectoris, unspecified: Secondary | ICD-10-CM | POA: Diagnosis not present

## 2022-09-11 DIAGNOSIS — I5031 Acute diastolic (congestive) heart failure: Secondary | ICD-10-CM | POA: Diagnosis not present

## 2022-09-12 ENCOUNTER — Other Ambulatory Visit: Payer: Self-pay | Admitting: Hematology

## 2022-09-13 DIAGNOSIS — I480 Paroxysmal atrial fibrillation: Secondary | ICD-10-CM | POA: Diagnosis not present

## 2022-09-13 DIAGNOSIS — E559 Vitamin D deficiency, unspecified: Secondary | ICD-10-CM | POA: Diagnosis not present

## 2022-09-13 DIAGNOSIS — C8510 Unspecified B-cell lymphoma, unspecified site: Secondary | ICD-10-CM | POA: Diagnosis not present

## 2022-09-13 DIAGNOSIS — M109 Gout, unspecified: Secondary | ICD-10-CM | POA: Diagnosis not present

## 2022-09-13 DIAGNOSIS — K219 Gastro-esophageal reflux disease without esophagitis: Secondary | ICD-10-CM | POA: Diagnosis not present

## 2022-09-13 DIAGNOSIS — M5432 Sciatica, left side: Secondary | ICD-10-CM | POA: Diagnosis not present

## 2022-09-13 DIAGNOSIS — I10 Essential (primary) hypertension: Secondary | ICD-10-CM | POA: Diagnosis not present

## 2022-09-13 DIAGNOSIS — E883 Tumor lysis syndrome: Secondary | ICD-10-CM | POA: Diagnosis not present

## 2022-09-13 DIAGNOSIS — D649 Anemia, unspecified: Secondary | ICD-10-CM | POA: Diagnosis not present

## 2022-09-16 ENCOUNTER — Other Ambulatory Visit: Payer: Self-pay | Admitting: *Deleted

## 2022-09-16 ENCOUNTER — Telehealth: Payer: Self-pay | Admitting: Cardiovascular Disease

## 2022-09-16 DIAGNOSIS — C8338 Diffuse large B-cell lymphoma, lymph nodes of multiple sites: Secondary | ICD-10-CM

## 2022-09-16 DIAGNOSIS — D469 Myelodysplastic syndrome, unspecified: Secondary | ICD-10-CM

## 2022-09-16 NOTE — Telephone Encounter (Signed)
Patient states its ok to speak to his daughter.  She was concerned because after his last chemo session patient had edema on his legs. She had the facility where he was for staying for rehab to draw labs and she stated his BNP was elevated 2400+. He has scheduled appointment with you on Friday . She will upload labs via my chart. He is not experiencing chest pain or discomfort or SOB.

## 2022-09-16 NOTE — Telephone Encounter (Signed)
Pt daughter returning call 

## 2022-09-16 NOTE — Telephone Encounter (Signed)
Returned call to patient's daughter. She placed me on hold and phone was disconnected.  Tried to call back. LMTCB

## 2022-09-16 NOTE — Telephone Encounter (Signed)
Patient's daughter calling in to see if patient needs to be seen sooner. States that he BMT was at 2400 when labs was taking. She concern that he is on congestive heart failure. Please advise

## 2022-09-17 NOTE — Telephone Encounter (Signed)
Spoke with Karena Addison pt daughter, advised her she is not listed on pt DRP.  Karena Addison stated she currently at work, however, she will call her father on 3-way to give [place] verbal permission to speak with her.   Due to language barrier, an Swahili interpreter was called.  Interpreter name York Spaniel , interpreter ID [id number]   Explained that we will forward his message to the provider and nurse. Patient voiced understanding.

## 2022-09-17 NOTE — Telephone Encounter (Signed)
Patient's daughter was unable to upload labs via MyChart and states that this was faxed instead and would like a call back to confirm that this was received. Please advise.

## 2022-09-17 NOTE — Telephone Encounter (Signed)
Spoke with daughter and stated patient is not having any symptoms. He will continue lasix and see [place] on Friday.

## 2022-09-18 ENCOUNTER — Inpatient Hospital Stay: Payer: 59

## 2022-09-18 ENCOUNTER — Other Ambulatory Visit: Payer: Self-pay

## 2022-09-18 ENCOUNTER — Inpatient Hospital Stay (HOSPITAL_BASED_OUTPATIENT_CLINIC_OR_DEPARTMENT_OTHER): Payer: 59 | Admitting: Physician Assistant

## 2022-09-18 VITALS — BP 141/88 | HR 67 | Temp 97.2°F | Resp 18 | Wt 168.2 lb

## 2022-09-18 DIAGNOSIS — C8338 Diffuse large B-cell lymphoma, lymph nodes of multiple sites: Secondary | ICD-10-CM

## 2022-09-18 DIAGNOSIS — Z95828 Presence of other vascular implants and grafts: Secondary | ICD-10-CM

## 2022-09-18 DIAGNOSIS — Z5112 Encounter for antineoplastic immunotherapy: Secondary | ICD-10-CM | POA: Diagnosis not present

## 2022-09-18 DIAGNOSIS — D469 Myelodysplastic syndrome, unspecified: Secondary | ICD-10-CM

## 2022-09-18 DIAGNOSIS — Z79899 Other long term (current) drug therapy: Secondary | ICD-10-CM | POA: Diagnosis not present

## 2022-09-18 DIAGNOSIS — D4620 Refractory anemia with excess of blasts, unspecified: Secondary | ICD-10-CM | POA: Diagnosis not present

## 2022-09-18 DIAGNOSIS — I480 Paroxysmal atrial fibrillation: Secondary | ICD-10-CM | POA: Diagnosis not present

## 2022-09-18 DIAGNOSIS — Z5111 Encounter for antineoplastic chemotherapy: Secondary | ICD-10-CM | POA: Diagnosis not present

## 2022-09-18 DIAGNOSIS — Z5189 Encounter for other specified aftercare: Secondary | ICD-10-CM | POA: Diagnosis not present

## 2022-09-18 LAB — CBC WITH DIFFERENTIAL (CANCER CENTER ONLY)
Abs Immature Granulocytes: 0.12 10*3/uL — ABNORMAL HIGH (ref 0.00–0.07)
Basophils Absolute: 0 10*3/uL (ref 0.0–0.1)
Basophils Relative: 0 %
Eosinophils Absolute: 0.1 10*3/uL (ref 0.0–0.5)
Eosinophils Relative: 1 %
HCT: 30.8 % — ABNORMAL LOW (ref 39.0–52.0)
Hemoglobin: 9.8 g/dL — ABNORMAL LOW (ref 13.0–17.0)
Immature Granulocytes: 1 %
Lymphocytes Relative: 13 %
Lymphs Abs: 1.2 10*3/uL (ref 0.7–4.0)
MCH: 29.8 pg (ref 26.0–34.0)
MCHC: 31.8 g/dL (ref 30.0–36.0)
MCV: 93.6 fL (ref 80.0–100.0)
Monocytes Absolute: 0.8 10*3/uL (ref 0.1–1.0)
Monocytes Relative: 9 %
Neutro Abs: 6.7 10*3/uL (ref 1.7–7.7)
Neutrophils Relative %: 76 %
Platelet Count: 126 10*3/uL — ABNORMAL LOW (ref 150–400)
RBC: 3.29 MIL/uL — ABNORMAL LOW (ref 4.22–5.81)
RDW: 16.3 % — ABNORMAL HIGH (ref 11.5–15.5)
WBC Count: 8.9 10*3/uL (ref 4.0–10.5)
nRBC: 0 % (ref 0.0–0.2)

## 2022-09-18 LAB — CMP (CANCER CENTER ONLY)
ALT: 16 U/L (ref 0–44)
AST: 15 U/L (ref 15–41)
Albumin: 3.7 g/dL (ref 3.5–5.0)
Alkaline Phosphatase: 184 U/L — ABNORMAL HIGH (ref 38–126)
Anion gap: 6 (ref 5–15)
BUN: 7 mg/dL — ABNORMAL LOW (ref 8–23)
CO2: 29 mmol/L (ref 22–32)
Calcium: 8.6 mg/dL — ABNORMAL LOW (ref 8.9–10.3)
Chloride: 104 mmol/L (ref 98–111)
Creatinine: 0.73 mg/dL (ref 0.61–1.24)
GFR, Estimated: >60 mL/min (ref >60)
Glucose, Bld: 107 mg/dL — ABNORMAL HIGH (ref 70–99)
Potassium: 3.3 mmol/L — ABNORMAL LOW (ref 3.5–5.1)
Sodium: 139 mmol/L (ref 135–145)
Total Bilirubin: 0.4 mg/dL (ref 0.3–1.2)
Total Protein: 5.7 g/dL — ABNORMAL LOW (ref 6.5–8.1)

## 2022-09-18 LAB — SURGICAL PATHOLOGY

## 2022-09-18 LAB — SAMPLE TO BLOOD BANK

## 2022-09-18 LAB — LACTATE DEHYDROGENASE: LDH: 192 U/L (ref 98–192)

## 2022-09-18 MED ORDER — LIDOCAINE-PRILOCAINE 2.5-2.5 % EX CREA
TOPICAL_CREAM | CUTANEOUS | 3 refills | Status: DC
Start: 2022-09-18 — End: 2023-05-24

## 2022-09-18 MED ORDER — SODIUM CHLORIDE 0.9% FLUSH
10.0000 mL | INTRAVENOUS | Status: DC | PRN
  Administered 2022-09-18: 10 mL

## 2022-09-18 MED ORDER — HEPARIN SOD (PORK) LOCK FLUSH 100 UNIT/ML IV SOLN
500.0000 [IU] | Freq: Once | INTRAVENOUS | Status: AC | PRN
  Administered 2022-09-18: 500 [IU]

## 2022-09-18 NOTE — Progress Notes (Signed)
HEMATOLOGY ONCOLOGY CLINIC NOTE  Date of service: 09/18/22    Patient Care Team: Tyler Guard, FNP as PCP - General (Family Medicine) [name], Tyler Ramp, MD as PCP - Cardiology (Cardiology) Tyler Maine, MD as Consulting Physician (Hematology)  SUMMARY OF ONCOLOGIC HISTORY: Oncology History  Diffuse large B-cell lymphoma of lymph nodes of multiple regions [facility])  07/25/2022 Initial Diagnosis   Diffuse large B-cell lymphoma of lymph nodes of multiple regions [facility])   08/06/2022 -  Chemotherapy   Patient is on Treatment Plan : NON-HODGKINS LYMPHOMA R-CHOP q21d      INTERVAL HISTORY:  Tyler [name] returns for a follow up for diffuse large B cell lymphoma. He was last seen by Tyler [name] on 09/05/2022. In the interim, he completed Cycle 2 of R-mini CHOP on 09/05/2022.   Tyler [name] reports his energy levels are improving since recent hospitalization. His weight has decreased along with his lower extremity edema. He discontinued his lasix yesterday. He denies nausea, vomiting or bowel habit changes. He denies easy bruising or signs of bleeding. He reports occasional headaches but no vision changes or dizziness. He denies fevers, chills, sweats, shortness of breath, chest pain or cough. He has no other complaints.   REVIEW OF SYSTEMS:   10 Point review of Systems was done is negative except as noted above.   Past Medical History:  Diagnosis Date   Cancer [facility])    prostate   Follicular lymphoma (HCC) 2024   History of pulmonary embolism    Hypertension    Myelodysplastic syndrome (HCC)    PAF (paroxysmal atrial fibrillation) (HCC)    Recurrent UTI    Past Surgical History:  Procedure Laterality Date   BIOPSY  08/21/2022   Procedure: BIOPSY;  Surgeon: Tyler Fiedler, MD;  Location: [facility] ENDOSCOPY;  Service: Gastroenterology;;   ESOPHAGOGASTRODUODENOSCOPY (EGD) WITH PROPOFOL N/A 08/21/2022   Procedure: ESOPHAGOGASTRODUODENOSCOPY (EGD) WITH PROPOFOL;  Surgeon: Tyler Fiedler, MD;   Location: [facility] ENDOSCOPY;  Service: Gastroenterology;  Laterality: N/A;   IR IMAGING GUIDED PORT INSERTION  08/26/2022   LYMPHADENECTOMY Bilateral 09/01/2019   Procedure: LYMPHADENECTOMY;  Surgeon: Tyler Ache, MD;  Location: [facility] ORS;  Service: Urology;  Laterality: Bilateral;   ROBOT ASSISTED LAPAROSCOPIC RADICAL PROSTATECTOMY N/A 09/01/2019   Procedure: XI ROBOTIC ASSISTED LAPAROSCOPIC RADICAL PROSTATECTOMY;  Surgeon: Tyler Ache, MD;  Location: [facility] ORS;  Service: Urology;  Laterality: N/A;  3 HRS   Social History   Tobacco Use   Smoking status: Never   Smokeless tobacco: Never  Vaping Use   Vaping status: Never Used  Substance Use Topics   Alcohol use: Yes    Comment: at least one 40 oz beer daily   Drug use: Never    ALLERGIES:  is allergic to amitriptyline, amlodipine, and neurontin [gabapentin].  MEDICATIONS:  Current Outpatient Medications  Medication Sig Dispense Refill   allopurinol (ZYLOPRIM) 100 MG tablet TAKE 1 TABLET BY MOUTH TWICE A DAY 180 tablet 1   amiodarone (PACERONE) 200 MG tablet Take 1 tablet (200 mg total) by mouth daily. 90 tablet 1   B Complex Vitamins (VITAMIN B COMPLEX) TABS Take 1 tablet by mouth daily. 30 tablet 0   cholecalciferol (VITAMIN D3) 25 MCG (1000 UNIT) tablet Take 2 tablets (2,000 Units total) by mouth daily.     diclofenac Sodium (VOLTAREN) 1 % GEL APPLY 4 G TOPICALLY 4 (FOUR) TIMES DAILY. APPLY TO RIGHT LATERAL HIP AND THIGH DOWN TO KNEE (Patient taking differently: Apply 4 g topically daily as  needed (For pain).) 300 g 1   folic acid (FOLVITE) 1 MG tablet Take 1 mg by mouth daily.     furosemide (LASIX) 40 MG tablet Take 40 mg by mouth daily.     KLOR-CON M20 20 MEQ tablet TAKE 1 TABLET BY MOUTH TWICE A DAY 180 tablet 1   lidocaine-prilocaine (EMLA) cream Apply to affected area once (Patient taking differently: Apply 1 Application topically daily as needed (For port).) 30 g 3   Menthol-Methyl Salicylate (MUSCLE RUB) 10-15 % CREA Apply 1  Application topically daily as needed for muscle pain.     Multiple Vitamin (MULTIVITAMIN WITH MINERALS) TABS tablet Take 1 tablet by mouth daily.     ondansetron (ZOFRAN) 8 MG tablet Take 1 tablet (8 mg total) by mouth every 8 (eight) hours as needed for nausea or vomiting. Start on the third day after cyclophosphamide chemotherapy. 30 tablet 1   oxyCODONE (OXY IR/ROXICODONE) 5 MG immediate release tablet Take 1 tablet (5 mg total) by mouth every 6 (six) hours as needed for severe pain. 15 tablet 0   pantoprazole (PROTONIX) 40 MG tablet Take 1 tablet (40 mg total) by mouth 2 (two) times daily. For 12 weeks  \   predniSONE (DELTASONE) 50 MG tablet 50mg  po daily with breakfast for 5 days (starting the day after each chemotherapy) 25 tablet 0   Tenofovir Alafenamide Fumarate (VEMLIDY) 25 MG TABS Take 1 tablet (25 mg total) by mouth daily. 30 tablet    furosemide (LASIX) 20 MG tablet Take 1 tablet (20 mg total) by mouth daily. (Patient not taking: Reported on 09/18/2022) 30 tablet 0   No current facility-administered medications for this visit.    PHYSICAL EXAMINATION: ECOG PERFORMANCE STATUS: 1 - Symptomatic but completely ambulatory VSS GENERAL:alert, in no acute distress and comfortable SKIN: no acute rashes, no significant lesions EYES: conjunctiva are pink and non-injected, sclera anicteric LUNGS: clear to auscultation b/l with normal respiratory effort HEART: regular rate & rhythm Extremity: no pedal edema PSYCH: alert & oriented x 3 with fluent speech NEURO: no focal motor/sensory deficits   LABORATORY DATA:   I have reviewed the data as listed .    Latest Ref Rng & Units 09/18/2022   10:35 AM 09/05/2022    9:22 AM 08/28/2022    4:05 AM  CBC  WBC 4.0 - 10.5 K/uL 8.9  7.1  7.8   Hemoglobin 13.0 - 17.0 g/dL 9.8  8.7  7.8   Hematocrit 39.0 - 52.0 % 30.8  26.4  23.4   Platelets 150 - 400 K/uL 126  279  237    .    Latest Ref Rng & Units 09/18/2022   10:35 AM 09/05/2022    9:22 AM  08/30/2022    8:35 AM  CMP  Glucose 70 - 99 mg/dL 027  91  89   BUN 8 - 23 mg/dL 7  6  11    Creatinine 0.61 - 1.24 mg/dL 2.53  6.64  4.03   Sodium 135 - 145 mmol/L 139  141  140   Potassium 3.5 - 5.1 mmol/L 3.3  3.6  3.6   Chloride 98 - 111 mmol/L 104  105  100   CO2 22 - 32 mmol/L 29  26  24    Calcium 8.9 - 10.3 mg/dL 8.6  7.8  7.9   Total Protein 6.5 - 8.1 g/dL 5.7  5.7    Total Bilirubin 0.3 - 1.2 mg/dL 0.4  0.5    Alkaline Phos  38 - 126 U/L 184  163    AST 15 - 41 U/L 15  19    ALT 0 - 44 U/L 16  16     Lymph node biopsy 07/16/2022    RADIOGRAPHIC STUDIES: I have personally reviewed the radiological images as listed and agreed with the findings in the report. IR IMAGING GUIDED PORT INSERTION  Result Date: 08/26/2022 INDICATION: 72 year old male with history of lymphoma requiring central venous access for chemotherapy administration. EXAM: IMPLANTED PORT A CATH PLACEMENT WITH ULTRASOUND AND FLUOROSCOPIC GUIDANCE COMPARISON:  None Available. MEDICATIONS: None. ANESTHESIA/SEDATION: Moderate (conscious) sedation was employed during this procedure. A total of Versed 2 mg and Fentanyl 100 mcg was administered intravenously. Moderate Sedation Time: 14 minutes. The patient's level of consciousness and vital signs were monitored continuously by radiology nursing throughout the procedure under my direct supervision. CONTRAST:  None FLUOROSCOPY TIME:  One mGy COMPLICATIONS: None immediate. PROCEDURE: The procedure, risks, benefits, and alternatives were explained to the patient. Questions regarding the procedure were encouraged and answered. The patient understands and consents to the procedure. The right neck and chest were prepped with chlorhexidine in a sterile fashion, and a sterile drape was applied covering the operative field. Maximum barrier sterile technique with sterile gowns and gloves were used for the procedure. A timeout was performed prior to the initiation of the procedure. Ultrasound  was used to examine the jugular vein which was compressible and free of internal echoes. A skin marker was used to demarcate the planned venotomy and port pocket incision sites. Local anesthesia was provided to these sites and the subcutaneous tunnel track with 1% lidocaine with 1:100,000 epinephrine. A small incision was created at the jugular access site and blunt dissection was performed of the subcutaneous tissues. Under ultrasound guidance, the jugular vein was accessed with a 21 ga micropuncture needle and an 0.018" wire was inserted to the superior vena cava. Real-time ultrasound guidance was utilized for vascular access including the acquisition of a permanent ultrasound image documenting patency of the accessed vessel. A 5 Fr micopuncture set was then used, through which a 0.035" Rosen wire was passed under fluoroscopic guidance into the inferior vena cava. An 8 Fr dilator was then placed over the wire. A subcutaneous port pocket was then created along the upper chest wall utilizing a combination of sharp and blunt dissection. The pocket was irrigated with sterile saline, packed with gauze, and observed for hemorrhage. A single lumen "ISP" sized power injectable port was chosen for placement. The 8 Fr catheter was tunneled from the port pocket site to the venotomy incision. The port was placed in the pocket. The external catheter was trimmed to appropriate length. The dilator was exchanged for an 8 Fr peel-away sheath under fluoroscopic guidance. The catheter was then placed through the sheath and the sheath was removed. Final catheter positioning was confirmed and documented with a fluoroscopic spot radiograph. The port was accessed with a Huber needle, aspirated, and flushed with heparinized saline. The deep dermal layer of the port pocket incision was closed with interrupted 3-0 Vicryl suture. Dermabond was then placed over the port pocket and neck incisions. The patient tolerated the procedure well  without immediate post procedural complication. FINDINGS: After catheter placement, the tip lies within the superior cavoatrial junction. The catheter aspirates and flushes normally and is ready for immediate use. IMPRESSION: Successful placement of a power injectable Port-A-Cath via the right internal jugular vein. The catheter is ready for immediate use. Marliss Coots, MD [facility] Electronically Signed   By: Marliss Coots M.D.   On: 08/26/2022 12:23   VAS [place] UPPER EXTREMITY VENOUS DUPLEX  Result Date: 08/23/2022 UPPER VENOUS STUDY  Patient Name:  Tyler [name]  Date of Exam:   08/23/2022 Medical Rec #: [id number]     Accession #:    [id number] Date of Birth: Sep 27, 1950     Patient Gender: M Patient Age:   31 years Exam Location:  [facility] Procedure:      VAS [place] UPPER EXTREMITY VENOUS DUPLEX Referring Phys: Victorino Dike YATES --------------------------------------------------------------------------------  Indications: Swelling Risk Factors: Lymphoma / chemotherapy. Limitations: PICC line and HD catheter. Comparison Study: No previous exams Performing Technologist: Jody Hill RVT, RDMS  Examination Guidelines: A complete evaluation includes B-mode imaging, spectral Doppler, color Doppler, and power Doppler as needed of all accessible portions of each vessel. Bilateral testing is considered an integral part of a complete examination. Limited examinations for reoccurring indications may be performed as noted.  Right Findings: +----------+------------+---------+-----------+----------+--------------+ RIGHT     CompressiblePhasicitySpontaneousProperties   Summary     +----------+------------+---------+-----------+----------+--------------+ IJV                                                 Not visualized +----------+------------+---------+-----------+----------+--------------+ Subclavian    Full       Yes       Yes                              +----------+------------+---------+-----------+----------+--------------+ Axillary      Full       Yes       Yes                             +----------+------------+---------+-----------+----------+--------------+ Brachial      Full       Yes       Yes                             +----------+------------+---------+-----------+----------+--------------+ Radial        Full                                                 +----------+------------+---------+-----------+----------+--------------+ Ulnar         Full                                                 +----------+------------+---------+-----------+----------+--------------+ Cephalic      Full                                                 +----------+------------+---------+-----------+----------+--------------+ Basilic       Full                                                 +----------+------------+---------+-----------+----------+--------------+  Limited visualization of basilic and brachial veins due to PICC line placement. Vessels are patent in areas visualized.  Left Findings: +----------+------------+---------+-----------+----------+--------------------+ LEFT      CompressiblePhasicitySpontaneousProperties      Summary        +----------+------------+---------+-----------+----------+--------------------+ Subclavian               Yes       Yes                   patent by                                                              color/doppler     +----------+------------+---------+-----------+----------+--------------------+  Summary:  Right: No evidence of deep vein thrombosis in the upper extremity. No evidence of superficial vein thrombosis in the upper extremity. However, unable to visualize the IJV. Subcutaneous edema in area of the forearm.  Left: No evidence of thrombosis in the subclavian.  *See table(s) above for measurements and observations.  Diagnosing physician:  Coral Else MD Electronically signed by Coral Else MD on 08/23/2022 at 10:42:26 PM.    Final     ASSESSMENT & PLAN:  Tyler [name] is a 72 y.o. male who returns for a follow up for high grade large B cell lymphoma   # High grade large b cell lymphoma  -No evidence of double hit or triple hit lymphoma. -Baseline ECHO from 07/29/2022 EFT >75%.  -Started R-mini-CHOP on 08/06/2022. Rituxan was held with Cycle 1 due to significant tumor lysis and multifactorial AKI.  #Paroxysmal A fib -Not a candidate for anticoag due to anemia and thrombocytopenia  #Anemia and mild thrombocytopenia -- likely multifactorial -- lymphoma + MDS+ >?sepsis r/o GI bleeding.  And now due to his mini CHOP chemotherapy.  #H/O acute kidney injury: -Possibly related to prerenal causes versus contrast nephropathy versus high risk of tumor lysis syndrome. -Renal ultrasound with no obstruction -Received 1 dose of rasburicase on 6/12 and 1 dose of rasburicase 6 mg on 08/08/22. -Currently on CRRT.   PLAN: -Reviewed labs from today with patient. WBC 8.9, Hgb 9.8, Plt 126. Creatinine and LFTs normal.  -Potassium is 3.3. Encouraged potassium rich foods.  Patient has prescription for potassium chloride 20 mEq twice daily. Advised take as prescribed.  -No need for blood transfusion today -Patient is scheduled for a follow up with cardiology this Friday, 09/20/2022 due to elevated BNP levels concerning for heart failure. Likely will obtain repeat ECHO.  -Will wait on repeat ECHO results and recommendations from cardiologist before proceeding with treatment scheduled on 09/26/2022. Especially since his regimen includes doxorubicin.    FOLLOW-UP: 09/26/2022: Labs, follow up before Cycle 3 of R-mini-CHOP   All of the patient's questions were answered with apparent satisfaction. The patient knows to call the clinic with any problems, questions or concerns.  I have spent a total of 25 minutes minutes of face-to-face and  non-face-to-face time, preparing to see the patient, performing a medically appropriate examination, counseling and educating the patient, documenting clinical information in the electronic health record,  and care coordination.   Georga Kaufmann PA-C Dept of Hematology and Oncology [facility] Phone: [phone]

## 2022-09-19 NOTE — Progress Notes (Signed)
Cardiology Office Note:   Date:  09/20/2022  NAME:  Tyler [name]    MRN: [id number] DOB:  1950/11/06   PCP:  Dot Been, FNP  Cardiologist:  Reatha Harps, MD  Electrophysiologist:  None   Referring MD: Johney Maine, MD   Chief Complaint  Patient presents with   Follow-up         History of Present Illness:   Tyler [name] is a 72 y.o. 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  Mid to the hospital in June for sepsis.  Known history of diffuse large B-cell lymphoma.  Currently on R-EPOCH.  This does include doxorubicin.  He actually went to rehab.  Labs were checked.  He was anemic but this has improved as of yesterday in the cancer center.  A BNP value was obtained.  On review of records that value was 2433.  He has been placed on Lasix 20 mg daily.  He has no evidence of edema on exam.  No JVD.  His examination is quite inconsistent with congestive heart failure.  However given that he is on doxorubicin he needs a repeat echocardiogram.  Echo in the hospital was normal.  He denies any chest pains or trouble breathing.  Does report some soreness in his left shoulder when he lays on his right side.  He does not have issues when he lays on his left side.  We discussed this is likely musculoskeletal.  EKG shows he is maintaining sinus rhythm.  He can stop amiodarone.  Not on anticoagulation due to recurrent anemia.  He also has significant thrombocytopenia after chemotherapy.  No recurrence of atrial fibrillation.  Overall seems to be doing well.  We did discuss that his laboratory values are quite discordant with his clinical examination today.  Problem List Diffuse Large B Lymphoma  Prostate CA s/p prostatectomy  Paroxysmal Afib -07/2022 -> sepsis -no AC due to GI bleed, anemia/CA 4. GI Bleed -gastric ulcer 08/21/2022 5. Anemia  -2/2 malignancy  6. AKI -tumor  lysis related  -brief HD  Past Medical History: Past Medical History:  Diagnosis Date   Cancer (HCC)    prostate   Follicular lymphoma (HCC) 2024   History of pulmonary embolism    Hypertension    Myelodysplastic syndrome (HCC)    PAF (paroxysmal atrial fibrillation) (HCC)    Recurrent UTI     Past Surgical History: Past Surgical History:  Procedure Laterality Date   BIOPSY  08/21/2022   Procedure: BIOPSY;  Surgeon: Beverley Fiedler, MD;  Location: [facility] ENDOSCOPY;  Service: Gastroenterology;;   ESOPHAGOGASTRODUODENOSCOPY (EGD) WITH PROPOFOL N/A 08/21/2022   Procedure: ESOPHAGOGASTRODUODENOSCOPY (EGD) WITH PROPOFOL;  Surgeon: Beverley Fiedler, MD;  Location[facility] ENDOSCOPY;  Service: Gastroenterology;  Laterality: N/A;   IR IMAGING GUIDED PORT INSERTION  08/26/2022   LYMPHADENECTOMY Bilateral 09/01/2019   Procedure: LYMPHADENECTOMY;  Surgeon: Sebastian Ache, MD;  Location: [facility] ORS;  Service: Urology;  Laterality: Bilateral;   ROBOT ASSISTED LAPAROSCOPIC RADICAL PROSTATECTOMY N/A 09/01/2019   Procedure: XI ROBOTIC ASSISTED LAPAROSCOPIC RADICAL PROSTATECTOMY;  Surgeon: Sebastian Ache, MD;  Location: [facility] ORS;  Service: Urology;  Laterality: N/A;  3 HRS    Current Medications: Current Meds  Medication Sig   allopurinol (ZYLOPRIM) 100 MG tablet TAKE 1 TABLET BY MOUTH TWICE A DAY   B Complex Vitamins (VITAMIN B COMPLEX) TABS Take 1 tablet by mouth daily.  cholecalciferol (VITAMIN D3) 25 MCG (1000 UNIT) tablet Take 2 tablets (2,000 Units total) by mouth daily.   diclofenac Sodium (VOLTAREN) 1 % GEL APPLY 4 G TOPICALLY 4 (FOUR) TIMES DAILY. APPLY TO RIGHT LATERAL HIP AND THIGH DOWN TO KNEE (Patient taking differently: Apply 4 g topically daily as needed (For pain).)   folic acid (FOLVITE) 1 MG tablet Take 1 mg by mouth daily.   furosemide (LASIX) 20 MG tablet Take 1 tablet (20 mg total) by mouth daily.   furosemide (LASIX) 40 MG tablet Take 40 mg by mouth daily.   KLOR-CON M20 20 MEQ tablet TAKE 1  TABLET BY MOUTH TWICE A DAY   lidocaine-prilocaine (EMLA) cream Apply to affected area once   Menthol-Methyl Salicylate (MUSCLE RUB) 10-15 % CREA Apply 1 Application topically daily as needed for muscle pain.   Multiple Vitamin (MULTIVITAMIN WITH MINERALS) TABS tablet Take 1 tablet by mouth daily.   ondansetron (ZOFRAN) 8 MG tablet Take 1 tablet (8 mg total) by mouth every 8 (eight) hours as needed for nausea or vomiting. Start on the third day after cyclophosphamide chemotherapy.   oxyCODONE (OXY IR/ROXICODONE) 5 MG immediate release tablet Take 1 tablet (5 mg total) by mouth every 6 (six) hours as needed for severe pain.   pantoprazole (PROTONIX) 40 MG tablet Take 1 tablet (40 mg total) by mouth 2 (two) times daily. For 12 weeks   predniSONE (DELTASONE) 50 MG tablet 50mg  po daily with breakfast for 5 days (starting the day after each chemotherapy)   Tenofovir Alafenamide Fumarate (VEMLIDY) 25 MG TABS Take 1 tablet (25 mg total) by mouth daily.   [DISCONTINUED] amiodarone (PACERONE) 200 MG tablet Take 1 tablet (200 mg total) by mouth daily.     Allergies:    Amitriptyline, Amlodipine, and Neurontin [gabapentin]   Social History: Social History   Socioeconomic History   Marital status: Married    Spouse name: Not on file   Number of children: Not on file   Years of education: Not on file   Highest education level: Not on file  Occupational History   Not on file  Tobacco Use   Smoking status: Never   Smokeless tobacco: Never  Vaping Use   Vaping status: Never Used  Substance and Sexual Activity   Alcohol use: Yes    Comment: at least one 40 oz beer daily   Drug use: Never   Sexual activity: Not on file  Other Topics Concern   Not on file  Social History Narrative   Not on file   Social Determinants of Health   Financial Resource Strain: Not on File (06/14/2021)   Received from [employer], [employer]    Financial Resource Strain: 0  Food Insecurity: No  Food Insecurity (07/30/2022)   Hunger Vital Sign    Worried About Running Out of Food in the Last Year: Never true    Ran Out of Food in the Last Year: Never true  Transportation Needs: No Transportation Needs (07/30/2022)   PRAPARE - Administrator, Civil Service (Medical): No    Lack of Transportation (Non-Medical): No  Physical Activity: Not on File (06/14/2021)   Received from [place], [place]   Physical Activity    Physical Activity: 0  Stress: Not on File (06/14/2021)   Received from [facility], [place]   Stress    Stress: 0  Social Connections: Not on File (06/14/2021)   Received from [place], [place]   Social Connections  Social Connections and Isolation: 0     Family History: The patient's family history includes Stroke in his father.  ROS:   All other ROS reviewed and negative. Pertinent positives noted in the HPI.     EKGs/Labs/Other Studies Reviewed:   The following studies were personally reviewed by me today:  EKG:  EKG is ordered today.    EKG Interpretation Date/Time:  Friday September 20 2022 08:42:29 EDT Ventricular Rate:  70 PR Interval:  154 QRS Duration:  86 QT Interval:  428 QTC Calculation: 462 R Axis:   27  Text Interpretation: Normal sinus rhythm Nonspecific T wave abnormality Confirmed by Lennie Odor [phone]) on 09/20/2022 8:48:07 AM   TTE 07/29/2022  1. Intracavitary gradient is realted to dynamic function. Left  ventricular ejection fraction, by estimation, is >75%. The left ventricle  has hyperdynamic function. The left ventricle has no regional wall motion  abnormalities. There is mild concentric  left ventricular hypertrophy. Left ventricular diastolic parameters were  normal.   2. Right ventricular systolic function is hyperdynamic. The right  ventricular size is normal. Tricuspid regurgitation signal is inadequate  for assessing PA pressure.   3. The mitral valve is normal in structure. No evidence of mitral valve  regurgitation.   4. The  aortic valve is tricuspid. There is mild calcification of the  aortic valve. Aortic valve regurgitation is not visualized. No aortic  stenosis is present.   Recent Labs: 08/01/2022: TSH 3.619 08/21/2022: B Natriuretic Peptide 142.7; Magnesium 1.5 09/18/2022: ALT 16; BUN 7; Creatinine 0.73; Hemoglobin 9.8; Platelet Count 126; Potassium 3.3; Sodium 139   Recent Lipid Panel No results found for: "CHOL", "TRIG", "HDL", "CHOLHDL", "VLDL", "LDLCALC", "LDLDIRECT"  Physical Exam:   VS:  BP 110/64   Pulse 70   Ht 5\' 7"  (1.702 m)   Wt 169 lb (76.7 kg)   SpO2 99%   BMI 26.47 kg/m    Wt Readings from Last 3 Encounters:  09/20/22 169 lb (76.7 kg)  09/18/22 168 lb 3.2 oz (76.3 kg)  09/05/22 175 lb 12 oz (79.7 kg)    General: Well nourished, well developed, in no acute distress Head: Atraumatic, normal size  Eyes: PEERLA, EOMI  Neck: Supple, no JVD Endocrine: No thryomegaly Cardiac: Normal S1, S2; RRR; no murmurs, rubs, or gallops Lungs: Clear to auscultation bilaterally, no wheezing, rhonchi or rales  Abd: Soft, nontender, no hepatomegaly  Ext: No edema, pulses 2+ Musculoskeletal: No deformities, BUE and BLE strength normal and equal Skin: Warm and dry, no rashes   Neuro: Alert and oriented to person, place, time, and situation, CNII-XII grossly intact, no focal deficits  Psych: Normal mood and affect   ASSESSMENT:   Tyler [name] is a 72 y.o. male who presents for the following: 1. Paroxysmal atrial fibrillation (HCC)   2. Leg edema   3. Elevated brain natriuretic peptide (BNP) level     PLAN:   1. Paroxysmal atrial fibrillation (HCC) -Diagnosed with atrial fibrillation in the setting of sepsis.  Has diffuse large B-cell lymphoma which is being treated.  Has issues with anemia and thrombocytopenia.  He also has been admitted with GI bleed.  Maintaining sinus rhythm.  Can stop amiodarone.  Transition to metoprolol succinate 25 mg daily.  Regarding anticoagulation I do not believe he is a  good candidate.  I have recommended to hold this for now.  I believe he is too high risk for bleeding.  Given that he has active malignancy he is not a great  candidate for Watchman procedure at this time.  We will see how he does.  He may become a candidate if his cancer is cured.  2. Leg edema 3. Elevated brain natriuretic peptide (BNP) level -Labs obtained by nursing home showed a BNP value of 2433.  He is on Lasix daily.  No signs of congestive heart failure.  No edema.  His echo in the hospital was normal.  He is currently on doxorubicin for treatment of diffuse large B-cell lymphoma.  He can take his Lasix as needed.  We are transitioning back to metoprolol.  I see no need for lisinopril at this time.  We will possibly put him on medical therapy depending on what his echocardiogram shows.  We will set him up to repeat this.  However, his laboratory data is quite discordant with his examination today.  He will see me back in 3 months.  We will hold on his chemotherapy until we have his lab data back.  I will send this message to his primary oncologist.      Disposition: Return in about 3 months (around 12/21/2022).  Medication Adjustments/Labs and Tests Ordered: Current medicines are reviewed at length with the patient today.  Concerns regarding medicines are outlined above.  Orders Placed This Encounter  Procedures   EKG 12-Lead   No orders of the defined types were placed in this encounter.  Patient Instructions  Medication Instructions:    You may take th furosemide ( Lasix0 20 mg or 40 mg  *If you need a refill on your cardiac medications before your next appointment, please call your pharmacy*   Lab Work: BNP  If you have labs (blood work) drawn today and your tests are completely normal, you will receive your results only by: MyChart Message (if you have MyChart) OR A paper copy in the mail If you have any lab test that is abnormal or we need to change your treatment, we  will call you to review the results.   Testing/Procedures: Not needed   Follow-Up: At [facility], you and your health needs are our priority.  As part of our continuing mission to provide you with exceptional heart care, we have created designated Provider Care Teams.  These Care Teams include your primary Cardiologist (physician) and Advanced Practice Providers (APPs -  Physician Assistants and Nurse Practitioners) who all work together to provide you with the care you need, when you need it.     Your next appointment:   3 month(s)  The format for your next appointment:   In Person  Provider:   Reatha Harps, MD    Other Instructions    Time Spent with Patient: I have spent a total of 35 minutes with patient reviewing hospital notes, telemetry, EKGs, labs and examining the patient as well as establishing an assessment and plan that was discussed with the patient.  > 50% of time was spent in direct patient care.  Signed, Lenna Gilford. Flora Lipps, MD, [facility]  [facility]  [address], [address] [place], [place] [address] [phone]  09/20/2022 9:09 AM

## 2022-09-20 ENCOUNTER — Encounter: Payer: Self-pay | Admitting: Cardiovascular Disease

## 2022-09-20 ENCOUNTER — Ambulatory Visit: Payer: 59 | Attending: Cardiovascular Disease | Admitting: Cardiovascular Disease

## 2022-09-20 VITALS — BP 110/64 | HR 70 | Ht 67.0 in | Wt 169.0 lb

## 2022-09-20 DIAGNOSIS — R600 Localized edema: Secondary | ICD-10-CM

## 2022-09-20 DIAGNOSIS — I480 Paroxysmal atrial fibrillation: Secondary | ICD-10-CM

## 2022-09-20 DIAGNOSIS — R7989 Other specified abnormal findings of blood chemistry: Secondary | ICD-10-CM | POA: Diagnosis not present

## 2022-09-20 MED ORDER — METOPROLOL SUCCINATE ER 25 MG PO TB24: 25.0000 mg | ORAL_TABLET | Freq: Every day | ORAL | 3 refills | Status: DC

## 2022-09-20 NOTE — Patient Instructions (Addendum)
Medication Instructions:   Stop taking amiodarone      You may take th furosemide ( Lasix) which ever one you are taking now  20 mg or 40 mg  on an as needed basis.    *If you need a refill on your cardiac medications before your next appointment, please call your pharmacy*   Lab Work: BNP  If you have labs (blood work) drawn today and your tests are completely normal, you will receive your results only by: MyChart Message (if you have MyChart) OR A paper copy in the mail If you have any lab test that is abnormal or we need to change your treatment, we will call you to review the results.   Testing/Procedures:  Will try an schedule Echo in the next week or so   Your physician has requested that you have an echocardiogram. Echocardiography is a painless test that uses sound waves to create images of your heart. It provides your doctor with information about the size and shape of your heart and how well your heart's chambers and valves are working. This procedure takes approximately one hour. There are no restrictions for this procedure. Please do NOT wear cologne, perfume, aftershave, or lotions (deodorant is allowed). Please arrive 15 minutes prior to your appointment time.    Follow-Up: At [facility], you and your health needs are our priority.  As part of our continuing mission to provide you with exceptional heart care, we have created designated Provider Care Teams.  These Care Teams include your primary Cardiologist (physician) and Advanced Practice Providers (APPs -  Physician Assistants and Nurse Practitioners) who all work together to provide you with the care you need, when you need it.     Your next appointment:   Keep appt 12/30/22  The format for your next appointment:   In Person  Provider:   Reatha Harps, MD

## 2022-09-22 ENCOUNTER — Encounter: Payer: Self-pay | Admitting: Hematology

## 2022-09-26 ENCOUNTER — Ambulatory Visit (HOSPITAL_COMMUNITY): Payer: 59 | Attending: Cardiovascular Disease

## 2022-09-26 MED FILL — Dexamethasone Sodium Phosphate Inj 100 MG/10ML: INTRAMUSCULAR | Qty: 1 | Status: AC

## 2022-09-26 MED FILL — Fosaprepitant Dimeglumine For IV Infusion 150 MG (Base Eq): INTRAVENOUS | Qty: 5 | Status: AC

## 2022-09-27 ENCOUNTER — Inpatient Hospital Stay: Payer: 59

## 2022-09-27 ENCOUNTER — Other Ambulatory Visit: Payer: Self-pay | Admitting: Hematology

## 2022-09-27 ENCOUNTER — Inpatient Hospital Stay: Payer: 59 | Admitting: Physician Assistant

## 2022-09-30 ENCOUNTER — Inpatient Hospital Stay: Payer: 59

## 2022-10-03 ENCOUNTER — Ambulatory Visit (HOSPITAL_COMMUNITY)
Admission: RE | Admit: 2022-10-03 | Discharge: 2022-10-03 | Disposition: A | Discharge status: Home / Self Care | Payer: 59 | Source: Ambulatory Visit | Attending: Cardiovascular Disease | Admitting: Cardiovascular Disease

## 2022-10-03 ENCOUNTER — Encounter: Payer: Self-pay | Admitting: Physician Assistant

## 2022-10-03 DIAGNOSIS — I517 Cardiomegaly: Secondary | ICD-10-CM | POA: Diagnosis not present

## 2022-10-03 DIAGNOSIS — I480 Paroxysmal atrial fibrillation: Secondary | ICD-10-CM | POA: Diagnosis not present

## 2022-10-03 DIAGNOSIS — R600 Localized edema: Secondary | ICD-10-CM | POA: Insufficient documentation

## 2022-10-03 DIAGNOSIS — I340 Nonrheumatic mitral (valve) insufficiency: Secondary | ICD-10-CM | POA: Insufficient documentation

## 2022-10-03 DIAGNOSIS — I5030 Unspecified diastolic (congestive) heart failure: Secondary | ICD-10-CM

## 2022-10-03 LAB — ECHOCARDIOGRAM COMPLETE
Area-P 1/2: 3.27 cm2
Calc EF: 63.7 %
MV M vel: 5.25 m/s
MV Peak grad: 110.3 mmHg
Radius: 0.2 cm
S' Lateral: 2.2 cm
Single Plane A2C EF: 61.8 %
Single Plane A4C EF: 66.5 %

## 2022-10-03 MED FILL — Fosaprepitant Dimeglumine For IV Infusion 150 MG (Base Eq): INTRAVENOUS | Qty: 5 | Status: AC

## 2022-10-03 MED FILL — Dexamethasone Sodium Phosphate Inj 100 MG/10ML: INTRAMUSCULAR | Qty: 1 | Status: AC

## 2022-10-03 NOTE — Progress Notes (Signed)
Called pt to introduce myself as his Dance movement psychotherapist and to discuss the [employer].  The cell number that's on file doesn't work so I will attempt to speak to him when he comes for a visit on 10/04/22.

## 2022-10-03 NOTE — Progress Notes (Signed)
Echocardiogram 2D Echocardiogram has been performed.  Tyler [name] 10/03/2022, 8:47 AM

## 2022-10-04 ENCOUNTER — Other Ambulatory Visit: Payer: Self-pay

## 2022-10-04 ENCOUNTER — Encounter: Payer: Self-pay | Admitting: Hematology

## 2022-10-04 ENCOUNTER — Inpatient Hospital Stay: Payer: 59 | Attending: Hematology

## 2022-10-04 ENCOUNTER — Inpatient Hospital Stay (HOSPITAL_BASED_OUTPATIENT_CLINIC_OR_DEPARTMENT_OTHER): Payer: 59 | Admitting: Hematology

## 2022-10-04 ENCOUNTER — Inpatient Hospital Stay: Payer: 59

## 2022-10-04 VITALS — BP 134/85 | HR 64 | Temp 97.6°F | Resp 16 | Wt 170.7 lb

## 2022-10-04 VITALS — BP 112/80 | HR 70 | Temp 97.9°F | Resp 18

## 2022-10-04 DIAGNOSIS — Z5189 Encounter for other specified aftercare: Secondary | ICD-10-CM | POA: Insufficient documentation

## 2022-10-04 DIAGNOSIS — C8338 Diffuse large B-cell lymphoma, lymph nodes of multiple sites: Secondary | ICD-10-CM | POA: Diagnosis not present

## 2022-10-04 DIAGNOSIS — Z7952 Long term (current) use of systemic steroids: Secondary | ICD-10-CM | POA: Insufficient documentation

## 2022-10-04 DIAGNOSIS — Z79899 Other long term (current) drug therapy: Secondary | ICD-10-CM | POA: Diagnosis not present

## 2022-10-04 DIAGNOSIS — Z5111 Encounter for antineoplastic chemotherapy: Secondary | ICD-10-CM

## 2022-10-04 DIAGNOSIS — Z95828 Presence of other vascular implants and grafts: Secondary | ICD-10-CM

## 2022-10-04 DIAGNOSIS — Z5112 Encounter for antineoplastic immunotherapy: Secondary | ICD-10-CM | POA: Diagnosis not present

## 2022-10-04 DIAGNOSIS — Z79624 Long term (current) use of inhibitors of nucleotide synthesis: Secondary | ICD-10-CM | POA: Diagnosis not present

## 2022-10-04 DIAGNOSIS — D701 Agranulocytosis secondary to cancer chemotherapy: Secondary | ICD-10-CM | POA: Diagnosis not present

## 2022-10-04 DIAGNOSIS — T451X5A Adverse effect of antineoplastic and immunosuppressive drugs, initial encounter: Secondary | ICD-10-CM | POA: Insufficient documentation

## 2022-10-04 DIAGNOSIS — Z8546 Personal history of malignant neoplasm of prostate: Secondary | ICD-10-CM | POA: Insufficient documentation

## 2022-10-04 LAB — CBC WITH DIFFERENTIAL (CANCER CENTER ONLY)
Abs Immature Granulocytes: 0.01 10*3/uL (ref 0.00–0.07)
Basophils Absolute: 0 10*3/uL (ref 0.0–0.1)
Basophils Relative: 0 %
Eosinophils Absolute: 0 10*3/uL (ref 0.0–0.5)
Eosinophils Relative: 1 %
HCT: 33.2 % — ABNORMAL LOW (ref 39.0–52.0)
Hemoglobin: 11 g/dL — ABNORMAL LOW (ref 13.0–17.0)
Immature Granulocytes: 0 %
Lymphocytes Relative: 31 %
Lymphs Abs: 2.1 10*3/uL (ref 0.7–4.0)
MCH: 30.2 pg (ref 26.0–34.0)
MCHC: 33.1 g/dL (ref 30.0–36.0)
MCV: 91.2 fL (ref 80.0–100.0)
Monocytes Absolute: 0.8 10*3/uL (ref 0.1–1.0)
Monocytes Relative: 12 %
Neutro Abs: 3.9 10*3/uL (ref 1.7–7.7)
Neutrophils Relative %: 56 %
Platelet Count: 163 10*3/uL (ref 150–400)
RBC: 3.64 MIL/uL — ABNORMAL LOW (ref 4.22–5.81)
RDW: 15.1 % (ref 11.5–15.5)
WBC Count: 7 10*3/uL (ref 4.0–10.5)
nRBC: 0 % (ref 0.0–0.2)

## 2022-10-04 LAB — CMP (CANCER CENTER ONLY)
ALT: 17 U/L (ref 0–44)
AST: 19 U/L (ref 15–41)
Albumin: 4 g/dL (ref 3.5–5.0)
Alkaline Phosphatase: 131 U/L — ABNORMAL HIGH (ref 38–126)
Anion gap: 7 (ref 5–15)
BUN: 7 mg/dL — ABNORMAL LOW (ref 8–23)
CO2: 26 mmol/L (ref 22–32)
Calcium: 8.7 mg/dL — ABNORMAL LOW (ref 8.9–10.3)
Chloride: 108 mmol/L (ref 98–111)
Creatinine: 0.82 mg/dL (ref 0.61–1.24)
GFR, Estimated: >60 mL/min (ref >60)
Glucose, Bld: 115 mg/dL — ABNORMAL HIGH (ref 70–99)
Potassium: 3.5 mmol/L (ref 3.5–5.1)
Sodium: 141 mmol/L (ref 135–145)
Total Bilirubin: 0.5 mg/dL (ref 0.3–1.2)
Total Protein: 6.2 g/dL — ABNORMAL LOW (ref 6.5–8.1)

## 2022-10-04 MED ORDER — PALONOSETRON HCL INJECTION 0.25 MG/5ML
0.2500 mg | Freq: Once | INTRAVENOUS | Status: AC
  Administered 2022-10-04: 0.25 mg via INTRAVENOUS
  Filled 2022-10-04: qty 5

## 2022-10-04 MED ORDER — SODIUM CHLORIDE 0.9 % IV SOLN
10.0000 mg | Freq: Once | INTRAVENOUS | Status: DC
  Filled 2022-10-04: qty 1

## 2022-10-04 MED ORDER — DIPHENHYDRAMINE HCL 25 MG PO CAPS
50.0000 mg | ORAL_CAPSULE | Freq: Once | ORAL | Status: AC
  Administered 2022-10-04: 50 mg via ORAL
  Filled 2022-10-04: qty 2

## 2022-10-04 MED ORDER — SODIUM CHLORIDE 0.9% FLUSH
10.0000 mL | INTRAVENOUS | Status: DC | PRN
  Administered 2022-10-04: 10 mL

## 2022-10-04 MED ORDER — ACETAMINOPHEN 325 MG PO TABS
650.0000 mg | ORAL_TABLET | Freq: Once | ORAL | Status: AC
  Administered 2022-10-04: 650 mg via ORAL
  Filled 2022-10-04: qty 2

## 2022-10-04 MED ORDER — SODIUM CHLORIDE 0.9 % IV SOLN
400.0000 mg/m2 | Freq: Once | INTRAVENOUS | Status: AC
  Administered 2022-10-04: 800 mg via INTRAVENOUS
  Filled 2022-10-04: qty 40

## 2022-10-04 MED ORDER — SODIUM CHLORIDE 0.9 % IV SOLN
10.0000 mg | Freq: Once | INTRAVENOUS | Status: AC
  Administered 2022-10-04: 10 mg via INTRAVENOUS
  Filled 2022-10-04: qty 10

## 2022-10-04 MED ORDER — SODIUM CHLORIDE 0.9 % IV SOLN: Freq: Once | INTRAVENOUS | Status: AC

## 2022-10-04 MED ORDER — VINCRISTINE SULFATE CHEMO INJECTION 1 MG/ML
1.0000 mg | Freq: Once | INTRAVENOUS | Status: AC
  Administered 2022-10-04: 1 mg via INTRAVENOUS
  Filled 2022-10-04: qty 1

## 2022-10-04 MED ORDER — DOXORUBICIN HCL CHEMO IV INJECTION 2 MG/ML
25.0000 mg/m2 | Freq: Once | INTRAVENOUS | Status: AC
  Administered 2022-10-04: 50 mg via INTRAVENOUS
  Filled 2022-10-04: qty 25

## 2022-10-04 MED ORDER — SODIUM CHLORIDE 0.9 % IV SOLN
150.0000 mg | Freq: Once | INTRAVENOUS | Status: DC
  Filled 2022-10-04: qty 5

## 2022-10-04 MED ORDER — SODIUM CHLORIDE 0.9 % IV SOLN
375.0000 mg/m2 | Freq: Once | INTRAVENOUS | Status: AC
  Administered 2022-10-04: 800 mg via INTRAVENOUS
  Filled 2022-10-04: qty 50

## 2022-10-04 MED ORDER — HEPARIN SOD (PORK) LOCK FLUSH 100 UNIT/ML IV SOLN
500.0000 [IU] | Freq: Once | INTRAVENOUS | Status: AC | PRN
  Administered 2022-10-04: 500 [IU]

## 2022-10-04 MED ORDER — SODIUM CHLORIDE 0.9 % IV SOLN
150.0000 mg | Freq: Once | INTRAVENOUS | Status: AC
  Administered 2022-10-04: 150 mg via INTRAVENOUS
  Filled 2022-10-04: qty 150

## 2022-10-04 NOTE — Patient Instructions (Signed)
[facility]  Discharge Instructions: Thank you for choosing [facility] to provide your oncology and hematology care.   If you have a lab appointment with the Cancer Center, please go directly to the Cancer Center and check in at the registration area.   Wear comfortable clothing and clothing appropriate for easy access to any Portacath or PICC line.   We strive to give you quality time with your provider. You may need to reschedule your appointment if you arrive late (15 or more minutes).  Arriving late affects you and other patients whose appointments are after yours.  Also, if you miss three or more appointments without notifying the office, you may be dismissed from the clinic at the provider's discretion.      For prescription refill requests, have your pharmacy contact our office and allow 72 hours for refills to be completed.    Today you received the following chemotherapy and/or immunotherapy agents Adriamycin, Vincristine, Cytoxan, Rituximab.      To help prevent nausea and vomiting after your treatment, we encourage you to take your nausea medication as directed.  BELOW ARE SYMPTOMS THAT SHOULD BE REPORTED IMMEDIATELY: *FEVER GREATER THAN 100.4 F (38 C) OR HIGHER *CHILLS OR SWEATING *NAUSEA AND VOMITING THAT IS NOT CONTROLLED WITH YOUR NAUSEA MEDICATION *UNUSUAL SHORTNESS OF BREATH *UNUSUAL BRUISING OR BLEEDING *URINARY PROBLEMS (pain or burning when urinating, or frequent urination) *BOWEL PROBLEMS (unusual diarrhea, constipation, pain near the anus) TENDERNESS IN MOUTH AND THROAT WITH OR WITHOUT PRESENCE OF ULCERS (sore throat, sores in mouth, or a toothache) UNUSUAL RASH, SWELLING OR PAIN  UNUSUAL VAGINAL DISCHARGE OR ITCHING   Items with * indicate a potential emergency and should be followed up as soon as possible or go to the Emergency Department if any problems should occur.  Please show the CHEMOTHERAPY ALERT CARD or  IMMUNOTHERAPY ALERT CARD at check-in to the Emergency Department and triage nurse.  Should you have questions after your visit or need to cancel or reschedule your appointment, please contact [facility]  Dept: [phone]  and follow the prompts.  Office hours are 8:00 a.m. to 4:30 p.m. Monday - Friday. Please note that voicemails left after 4:00 p.m. may not be returned until the following business day.  We are closed weekends and major holidays. You have access to a nurse at all times for urgent questions. Please call the main number to the clinic Dept: [phone] and follow the prompts.   For any non-urgent questions, you may also contact your provider using MyChart. We now offer e-Visits for anyone 28 and older to request care online for non-urgent symptoms. For details visit [email or web address].   Also download the MyChart app! Go to the app store, search "MyChart", open the app, select [facility], and log in with your MyChart username and password.

## 2022-10-04 NOTE — Progress Notes (Signed)
Patient seen by Dr. Kale  Vitals are within treatment parameters.  Labs reviewed: and are within treatment parameters.  Per physician team, patient is ready for treatment and there are NO modifications to the treatment plan.  

## 2022-10-04 NOTE — Progress Notes (Signed)
HEMATOLOGY ONCOLOGY CLINIC NOTE  Date of service: 10/04/22    Patient Care Team: Dot Been, FNP as PCP - General (Family Medicine) O'Neal, Ronnald Ramp, MD as PCP - Cardiology (Cardiology) Johney Maine, MD as Consulting Physician (Hematology)  CC f/u for large B cell lymphoma  SUMMARY OF ONCOLOGIC HISTORY: Oncology History  Diffuse large B-cell lymphoma of lymph nodes of multiple regions [facility])  07/25/2022 Initial Diagnosis   Diffuse large B-cell lymphoma of lymph nodes of multiple regions [facility])   08/06/2022 -  Chemotherapy   Patient is on Treatment Plan : NON-HODGKINS LYMPHOMA R-CHOP q21d       HISTORY OF PRESENTING ILLNESS: Tyler [name] is a 72 y.o. male with a past medical history of prostate cancer status post prostatectomy in 2021, history of bilateral pulmonary embolism after his surgery in 2021 presenting today for follow up and management of MDS.  He originally presented to the ED on 11/28/21 for low back pain with radiation down his leg. He was diagnosed with sciatica at urgent care prior to the ED visit. He had a negative xray and CT. He returned to the ED after developing a fever and hematuria. His urologist had stopped his Eliquis 3 months earlier. While in the ED on 12/10/21 he was noted to be febrile with blood work showing HGB of 6.2 and a UTI. His stool was heme-negative. He was hospitalized for treatment of his UTI with secondary sepsis.  Today, he states that he has been doing okay since our last visit. He feels that he is getting stronger.   However, he continues to have low back pain which is worsened with bending over and lying down on his back. His pain radiates into his lateral right leg. He has been walking for half an hour or so but notes that his pain worsens with walking in the cold. He has not been working since I last saw him. His back pain has improved somewhat since he stopped working. He is scheduled to see his PCP next week and plans to discuss  referral for orthospine at that time.  He notes some dizziness when he takes some of his medications.  Since his last visit he has completely stopped drinking alcohol and use of Ibuprofen. His appetite has improved. His HGB has continued to improve to 10.1. His platelets have also improved to 74,000. His WBC counts continue to remain WNL. CMP also improved.  He reports having some enlarged lymph nodes in the left anterior cervical region x1 month. He denies any dental problems. He denies any other lumps or bumps.  He reports a recent episode of chills. He denies any recent vaccines. He denies signs of infection such as sore throat, sinus drainage, cough, or urinary symptoms. He denies fevers. He denies nausea, vomiting, chest pain, dyspnea or cough. He denies any bowel changes. His weight has decreased 6 pounds over last 1.5 months .   INTERVAL HISTORY:  Tyler [name] is a 72 y.o. male here for continued evaluation and management of his low grade MDS.   Patient was last seen by me in clinic on 09/05/2022 and complained of frequent urination, bilateral lower extremity edema, and lower extremity itchiness.   Today, he is here for toxicity check for cycle 3, day 1 of R-mini-CHOP.  He reports normal p.o. intake and denies any leg swelling, fever, infection issues, change in bowel habits, mouth sores, leg swelling, abdominal pain, or major new medications.  He complains of back pain radiating to  the right thigh with numbness after sitting for extended periods of time. He denies any tingling in the area or new tingling in the hands or feet.  His weight has been stable and he regularly takes two tablets of vitamin D.   REVIEW OF SYSTEMS:    10 Point review of Systems was done is negative except as noted above.   Past Medical History:  Diagnosis Date   Cancer [facility])    prostate   Follicular lymphoma (HCC) 2024   History of pulmonary embolism    Hypertension    Myelodysplastic syndrome (HCC)     PAF (paroxysmal atrial fibrillation) (HCC)    Recurrent UTI    Past Surgical History:  Procedure Laterality Date   BIOPSY  08/21/2022   Procedure: BIOPSY;  Surgeon: Beverley Fiedler, MD;  Location: [facility] ENDOSCOPY;  Service: Gastroenterology;;   ESOPHAGOGASTRODUODENOSCOPY (EGD) WITH PROPOFOL N/A 08/21/2022   Procedure: ESOPHAGOGASTRODUODENOSCOPY (EGD) WITH PROPOFOL;  Surgeon: Beverley Fiedler, MD;  Location: [facility] ENDOSCOPY;  Service: Gastroenterology;  Laterality: N/A;   IR IMAGING GUIDED PORT INSERTION  08/26/2022   LYMPHADENECTOMY Bilateral 09/01/2019   Procedure: LYMPHADENECTOMY;  Surgeon: Sebastian Ache, MD;  Location: [facility] ORS;  Service: Urology;  Laterality: Bilateral;   ROBOT ASSISTED LAPAROSCOPIC RADICAL PROSTATECTOMY N/A 09/01/2019   Procedure: XI ROBOTIC ASSISTED LAPAROSCOPIC RADICAL PROSTATECTOMY;  Surgeon: Sebastian Ache, MD;  Location: [facility] ORS;  Service: Urology;  Laterality: N/A;  3 HRS   Social History   Tobacco Use   Smoking status: Never   Smokeless tobacco: Never  Vaping Use   Vaping status: Never Used  Substance Use Topics   Alcohol use: Yes    Comment: at least one 40 oz beer daily   Drug use: Never    ALLERGIES:  is allergic to amitriptyline, amlodipine, and neurontin [gabapentin].  MEDICATIONS:  Current Outpatient Medications  Medication Sig Dispense Refill   allopurinol (ZYLOPRIM) 100 MG tablet TAKE 1 TABLET BY MOUTH TWICE A DAY 180 tablet 1   B Complex Vitamins (VITAMIN B COMPLEX) TABS Take 1 tablet by mouth daily. 30 tablet 0   cholecalciferol (VITAMIN D3) 25 MCG (1000 UNIT) tablet Take 2 tablets (2,000 Units total) by mouth daily.     diclofenac Sodium (VOLTAREN) 1 % GEL APPLY 4 G TOPICALLY 4 (FOUR) TIMES DAILY. APPLY TO RIGHT LATERAL HIP AND THIGH DOWN TO KNEE (Patient taking differently: Apply 4 g topically daily as needed (For pain).) 300 g 1   folic acid (FOLVITE) 1 MG tablet Take 1 mg by mouth daily.     furosemide (LASIX) 20 MG tablet TAKE 1 TABLET BY MOUTH EVERY  DAY 90 tablet 1   furosemide (LASIX) 40 MG tablet Take 40 mg by mouth daily.     KLOR-CON M20 20 MEQ tablet TAKE 1 TABLET BY MOUTH TWICE A DAY 180 tablet 1   lidocaine-prilocaine (EMLA) cream Apply to affected area once 30 g 3   Menthol-Methyl Salicylate (MUSCLE RUB) 10-15 % CREA Apply 1 Application topically daily as needed for muscle pain.     metoprolol succinate (TOPROL XL) 25 MG 24 hr tablet Take 1 tablet (25 mg total) by mouth daily. 90 tablet 3   Multiple Vitamin (MULTIVITAMIN WITH MINERALS) TABS tablet Take 1 tablet by mouth daily.     ondansetron (ZOFRAN) 8 MG tablet Take 1 tablet (8 mg total) by mouth every 8 (eight) hours as needed for nausea or vomiting. Start on the third day after cyclophosphamide chemotherapy. 30 tablet 1  oxyCODONE (OXY IR/ROXICODONE) 5 MG immediate release tablet Take 1 tablet (5 mg total) by mouth every 6 (six) hours as needed for severe pain. 15 tablet 0   pantoprazole (PROTONIX) 40 MG tablet Take 1 tablet (40 mg total) by mouth 2 (two) times daily. For 12 weeks  \   predniSONE (DELTASONE) 50 MG tablet 50mg  po daily with breakfast for 5 days (starting the day after each chemotherapy) 25 tablet 0   Tenofovir Alafenamide Fumarate (VEMLIDY) 25 MG TABS Take 1 tablet (25 mg total) by mouth daily. 30 tablet    No current facility-administered medications for this visit.    PHYSICAL EXAMINATION: ECOG PERFORMANCE STATUS: 1 - Symptomatic but completely ambulatory .BP 134/85 (BP Location: Right Arm, Patient Position: Sitting)   Pulse 64   Temp 97.6 F (36.4 C) (Oral)   Resp 16   Wt 170 lb 11.2 oz (77.4 kg)   SpO2 100%   BMI 26.74 kg/m  GENERAL:alert, in no acute distress and comfortable SKIN: no acute rashes, no significant lesions EYES: conjunctiva are pink and non-injected, sclera anicteric OROPHARYNX: MMM, no exudates, no oropharyngeal erythema or ulceration NECK: supple, no JVD LYMPH:  no palpable lymphadenopathy in the cervical, axillary or inguinal  regions LUNGS: clear to auscultation b/l with normal respiratory effort HEART: regular rate & rhythm ABDOMEN:  normoactive bowel sounds , non tender, not distended. Extremity: no pedal edema PSYCH: alert & oriented x 3 with fluent speech NEURO: no focal motor/sensory deficits   LABORATORY DATA:   I have reviewed the data as listed .    Latest Ref Rng & Units 10/04/2022    9:31 AM 09/18/2022   10:35 AM 09/05/2022    9:22 AM  CBC  WBC 4.0 - 10.5 K/uL 7.0  8.9  7.1   Hemoglobin 13.0 - 17.0 g/dL 16.1  9.8  8.7   Hematocrit 39.0 - 52.0 % 33.2  30.8  26.4   Platelets 150 - 400 K/uL 163  126  279    .    Latest Ref Rng & Units 10/04/2022    9:31 AM 09/18/2022   10:35 AM 09/05/2022    9:22 AM  CMP  Glucose 70 - 99 mg/dL 096  045  91   BUN 8 - 23 mg/dL 7  7  6    Creatinine 0.61 - 1.24 mg/dL 4.09  8.11  9.14   Sodium 135 - 145 mmol/L 141  139  141   Potassium 3.5 - 5.1 mmol/L 3.5  3.3  3.6   Chloride 98 - 111 mmol/L 108  104  105   CO2 22 - 32 mmol/L 26  29  26    Calcium 8.9 - 10.3 mg/dL 8.7  8.6  7.8   Total Protein 6.5 - 8.1 g/dL 6.2  5.7  5.7   Total Bilirubin 0.3 - 1.2 mg/dL 0.5  0.4  0.5   Alkaline Phos 38 - 126 U/L 131  184  163   AST 15 - 41 U/L 19  15  19    ALT 0 - 44 U/L 17  16  16     Lymph node biopsy 07/16/2022    RADIOGRAPHIC STUDIES: I have personally reviewed the radiological images as listed and agreed with the findings in the report. ECHOCARDIOGRAM COMPLETE  Result Date: 10/03/2022    ECHOCARDIOGRAM REPORT   Patient Name:   Tyler [name] Date of Exam: 10/03/2022 Medical Rec #:  [id number]    Height:       67.0 in  Accession #:    [id number]   Weight:       169.0 lb Date of Birth:  01-13-1951    BSA:          1.883 m Patient Age:    72 years     BP:           110/64 mmHg Patient Gender: M            HR:           65 bpm. Exam Location:  Outpatient Procedure: 2D Echo, 3D Echo, Cardiac Doppler, Color Doppler and Strain Analysis Indications:    Bilateral lower extremity edema  [[id number]]                 Paroxysmal atrial fibrillation (HCC) [[id number]]  History:        Patient has prior history of Echocardiogram examinations, most                 recent 07/29/2022. Arrythmias:Atrial Fibrillation,                 Signs/Symptoms:Edema; Risk Factors:Hypertension.  Sonographer:    Eulah Pont RDCS Referring Phys: [id number] Ronnald Ramp O'NEAL  Sonographer Comments: Global longitudinal strain was attempted. IMPRESSIONS  1. Left ventricular ejection fraction, by estimation, is 60 to 65%. The left ventricle has normal function. The left ventricle has no regional wall motion abnormalities. There is mild concentric left ventricular hypertrophy. Left ventricular diastolic parameters are consistent with Grade I diastolic dysfunction (impaired relaxation). The average left ventricular global longitudinal strain is -21.9 %. The global longitudinal strain is normal.  2. Right ventricular systolic function is normal. The right ventricular size is normal. Tricuspid regurgitation signal is inadequate for assessing PA pressure.  3. The mitral valve is normal in structure. Mild mitral valve regurgitation. No evidence of mitral stenosis.  4. The aortic valve is tricuspid. Aortic valve regurgitation is not visualized. No aortic stenosis is present.  5. The inferior vena cava is normal in size with greater than 50% respiratory variability, suggesting right atrial pressure of 3 mmHg. FINDINGS  Left Ventricle: Left ventricular ejection fraction, by estimation, is 60 to 65%. The left ventricle has normal function. The left ventricle has no regional wall motion abnormalities. The average left ventricular global longitudinal strain is -21.9 %. The global longitudinal strain is normal. The left ventricular internal cavity size was normal in size. There is mild concentric left ventricular hypertrophy. Left ventricular diastolic parameters are consistent with Grade I diastolic dysfunction (impaired relaxation). Right  Ventricle: The right ventricular size is normal. Right ventricular systolic function is normal. Tricuspid regurgitation signal is inadequate for assessing PA pressure. The tricuspid regurgitant velocity is 2.47 m/s, and with an assumed right atrial  pressure of 3 mmHg, the estimated right ventricular systolic pressure is 27.4 mmHg. Left Atrium: Left atrial size was normal in size. Right Atrium: Right atrial size was normal in size. Pericardium: There is no evidence of pericardial effusion. Mitral Valve: The mitral valve is normal in structure. Mild mitral valve regurgitation. No evidence of mitral valve stenosis. Tricuspid Valve: The tricuspid valve is normal in structure. Tricuspid valve regurgitation is trivial. No evidence of tricuspid stenosis. Aortic Valve: The aortic valve is tricuspid. Aortic valve regurgitation is not visualized. No aortic stenosis is present. Pulmonic Valve: The pulmonic valve was normal in structure. Pulmonic valve regurgitation is trivial. No evidence of pulmonic stenosis. Aorta: The aortic root is normal in size and structure. Venous: The inferior vena  cava is normal in size with greater than 50% respiratory variability, suggesting right atrial pressure of 3 mmHg. IAS/Shunts: No atrial level shunt detected by color flow Doppler.  LEFT VENTRICLE PLAX 2D LVIDd:         3.90 cm     Diastology LVIDs:         2.20 cm     LV e' medial:    5.40 cm/s LV PW:         0.90 cm     LV E/e' medial:  12.9 LV IVS:        0.90 cm     LV e' lateral:   8.85 cm/s LVOT diam:     2.10 cm     LV E/e' lateral: 7.9 LV SV:         79 LV SV Index:   42          2D Longitudinal Strain LVOT Area:     3.46 cm    2D Strain GLS Avg:     -21.9 %  LV Volumes (MOD) LV vol d, MOD A2C: 80.9 ml 3D Volume EF: LV vol d, MOD A4C: 79.2 ml 3D EF:        55 % LV vol s, MOD A2C: 30.9 ml LV EDV:       133 ml LV vol s, MOD A4C: 26.5 ml LV ESV:       60 ml LV SV MOD A2C:     50.0 ml LV SV:        74 ml LV SV MOD A4C:     79.2 ml LV  SV MOD BP:      51.4 ml RIGHT VENTRICLE RV S prime:     12.30 cm/s TAPSE (M-mode): 1.7 cm LEFT ATRIUM             Index        RIGHT ATRIUM           Index LA diam:        3.10 cm 1.65 cm/m   RA Area:     11.60 cm LA Vol (A2C):   58.6 ml 31.13 ml/m  RA Volume:   24.00 ml  12.75 ml/m LA Vol (A4C):   39.9 ml 21.19 ml/m LA Biplane Vol: 48.7 ml 25.87 ml/m  AORTIC VALVE LVOT Vmax:   115.00 cm/s LVOT Vmean:  71.100 cm/s LVOT VTI:    0.229 m  AORTA Ao Root diam: 3.40 cm Ao Asc diam:  3.60 cm MITRAL VALVE                  TRICUSPID VALVE MV Area (PHT): 3.27 cm       TR Peak grad:   24.4 mmHg MV Decel Time: 232 msec       TR Vmax:        247.00 cm/s MR Peak grad:    110.2 mmHg MR Mean grad:    81.0 mmHg    SHUNTS MR Vmax:         525.00 cm/s  Systemic VTI:  0.23 m MR Vmean:        423.0 cm/s   Systemic Diam: 2.10 cm MR PISA:         0.25 cm MR PISA Eff ROA: 2 mm MR PISA Radius:  0.20 cm MV E velocity: 69.80 cm/s MV A velocity: 63.60 cm/s MV E/A ratio:  1.10 Olga Millers MD Electronically signed by Olga Millers MD Signature Date/Time: 10/03/2022/9:23:33 AM  Final     ASSESSMENT & PLAN:   72 year old male with:  #1 Syncope vs seizure  #2 Paroxysmal A fib-- no a candidate for anticoag due to anemia and thrombocytopenia  #3 Poor po intake with electrolyte issues  #4 Lactic acidosis -- ?sepsis vs hypoperfusion due to hypotension and P afib Though less likely cannot r/o possibility of Type B lactic acidosis from lymphoma "[place] effect"  #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 Typically would have done daEPOCH-R but with his r/o MDS high risk of cytopenias esp with etoposide Status post mini CHOP on 08/06/2022.  Expect nadir in  blood counts 10 to 12 days out. Rituxan was held due to significant tumor lysis and multifactorial AKI.  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  # 7 Anemia and mild thrombocytopenia -- likely multifactorial -- lymphoma +  MDS+ >?sepsis r/o GI bleeding.  And now due to his mini CHOP chemotherapy.  #8 Abdominal distention with partial obstruction versus ileus.  # 9 Hep B c AB +ve, Hep B SAg neg HepB SAb neg, HBV DNA PCR --neg.  #10 acute kidney injury with sodium level less than 20 possibly related to prerenal causes versus contrast nephropathy versus high risk of tumor lysis syndrome. Renal ultrasound with no obstruction Received 1 dose of rasburicase on 6/12 and 1 dose of rasburicase 6 mg on 6/13. Currently on CRRT.  11 neutropenia from chemotherapy-continues to be on G-CSF  PLAN:  -Discussed lab results on 10/04/2022 in detail with patient. CBC normal, showed WBC of 7.0K, hemoglobin improved to 11.0, and platelets normalized to 163K. -anemia has significantly improved -CMP shows that creatinine have normalized -repeat echocardiogram normal with no signs of CHF, Ejection fraction normal at 60-65% -Repeat BNP normal at 45.1 -okay to proceed with third cycle of treatment today -will order PET scan prior to next treatment to evaluate patient's response to treatment -likely that the nerve over the side of the right thigh gets pinched when sitting for extended periods -will double the dose of Vitamin D to 4000-5000 units daily as calcium level is low at 8.7. Informed patient that this will help the immune system clear the lymphoma -advised patient to continue to stay regularly active  FOLLOW-UP: -Plz schedule next 3 cycles of R-CHOP per integrated scheduling with port flush labs and MD visit with Dr. Candise Che with each cycle. -PET CT scan in 2 weeks  The total time spent in the appointment was 32 minutes* .  All of the patient's questions were answered with apparent satisfaction. The patient knows to call the clinic with any problems, questions or concerns.   Wyvonnia Lora MD MS AAHIVMS [facility] Hematology/Oncology Physician [facility]  .*Total Encounter Time as defined by the Centers for  Medicare and Medicaid Services includes, in addition to the face-to-face time of a patient visit (documented in the note above) non-face-to-face time: obtaining and reviewing outside history, ordering and reviewing medications, tests or procedures, care coordination (communications with other health care professionals or caregivers) and documentation in the medical record.    I,Mitra Faeizi,acting as a Neurosurgeon for Wyvonnia Lora, MD.,have documented all relevant documentation on the behalf of Wyvonnia Lora, MD,as directed by  Wyvonnia Lora, MD while in the presence of Wyvonnia Lora, MD.  .I have reviewed the above documentation for accuracy and completeness, and I agree with the above. Johney Maine MD

## 2022-10-04 NOTE — Progress Notes (Signed)
Attempted to speak to pt when he came to the office today regarding the [employer] but with the language barrier and no interpreter present it wasn't successful.

## 2022-10-07 ENCOUNTER — Telehealth: Payer: Self-pay

## 2022-10-07 ENCOUNTER — Other Ambulatory Visit: Payer: Self-pay

## 2022-10-07 ENCOUNTER — Inpatient Hospital Stay: Payer: 59

## 2022-10-07 VITALS — BP 133/80 | HR 65 | Temp 98.1°F | Resp 18

## 2022-10-07 DIAGNOSIS — Z7952 Long term (current) use of systemic steroids: Secondary | ICD-10-CM | POA: Diagnosis not present

## 2022-10-07 DIAGNOSIS — C8338 Diffuse large B-cell lymphoma, lymph nodes of multiple sites: Secondary | ICD-10-CM | POA: Diagnosis not present

## 2022-10-07 DIAGNOSIS — D701 Agranulocytosis secondary to cancer chemotherapy: Secondary | ICD-10-CM | POA: Diagnosis not present

## 2022-10-07 DIAGNOSIS — C61 Malignant neoplasm of prostate: Secondary | ICD-10-CM | POA: Diagnosis not present

## 2022-10-07 DIAGNOSIS — Z79899 Other long term (current) drug therapy: Secondary | ICD-10-CM | POA: Diagnosis not present

## 2022-10-07 DIAGNOSIS — Z5189 Encounter for other specified aftercare: Secondary | ICD-10-CM | POA: Diagnosis not present

## 2022-10-07 DIAGNOSIS — T451X5A Adverse effect of antineoplastic and immunosuppressive drugs, initial encounter: Secondary | ICD-10-CM | POA: Diagnosis not present

## 2022-10-07 DIAGNOSIS — Z79624 Long term (current) use of inhibitors of nucleotide synthesis: Secondary | ICD-10-CM | POA: Diagnosis not present

## 2022-10-07 DIAGNOSIS — Z5111 Encounter for antineoplastic chemotherapy: Secondary | ICD-10-CM | POA: Diagnosis not present

## 2022-10-07 DIAGNOSIS — Z5112 Encounter for antineoplastic immunotherapy: Secondary | ICD-10-CM | POA: Diagnosis not present

## 2022-10-07 DIAGNOSIS — Z8546 Personal history of malignant neoplasm of prostate: Secondary | ICD-10-CM | POA: Diagnosis not present

## 2022-10-07 MED ORDER — PEGFILGRASTIM-JMDB 6 MG/0.6ML ~~LOC~~ SOSY
6.0000 mg | PREFILLED_SYRINGE | Freq: Once | SUBCUTANEOUS | Status: AC
  Administered 2022-10-07: 6 mg via SUBCUTANEOUS
  Filled 2022-10-07: qty 0.6

## 2022-10-07 NOTE — Telephone Encounter (Signed)
Dr. Clyda Greener nurse with oncology called stating Dr. Candise Che would like for the patient to follow up with our office for Dr. Thedore Mins to manage patient's [facility]. Patient scheduled with Dr. Thedore Mins on 10/29/22. I have relayed appointment information to patient's daughter. Patient's daughter states that the patient just started the [facility]. She reports he was in a SNF after discharge from the hospital and they were unable to get the medication. She stated she had to go through [facility] to get the medication, but the patient the patient is home now.   T Pricilla Loveless

## 2022-10-08 NOTE — Telephone Encounter (Signed)
Noted. Thanks.

## 2022-10-10 ENCOUNTER — Encounter: Payer: Self-pay | Admitting: Hematology

## 2022-10-14 DIAGNOSIS — N393 Stress incontinence (female) (male): Secondary | ICD-10-CM | POA: Diagnosis not present

## 2022-10-14 DIAGNOSIS — C61 Malignant neoplasm of prostate: Secondary | ICD-10-CM | POA: Diagnosis not present

## 2022-10-14 DIAGNOSIS — R310 Gross hematuria: Secondary | ICD-10-CM | POA: Diagnosis not present

## 2022-10-14 DIAGNOSIS — N5231 Erectile dysfunction following radical prostatectomy: Secondary | ICD-10-CM | POA: Diagnosis not present

## 2022-10-15 ENCOUNTER — Other Ambulatory Visit (HOSPITAL_COMMUNITY): Payer: 59

## 2022-10-19 ENCOUNTER — Other Ambulatory Visit: Payer: Self-pay | Admitting: Hematology

## 2022-10-21 ENCOUNTER — Encounter: Payer: Self-pay | Admitting: Hematology

## 2022-10-22 ENCOUNTER — Ambulatory Visit (HOSPITAL_COMMUNITY)
Admission: RE | Admit: 2022-10-22 | Discharge: 2022-10-22 | Disposition: A | Discharge status: Home / Self Care | Payer: 59 | Source: Ambulatory Visit | Attending: Hematology | Admitting: Hematology

## 2022-10-22 DIAGNOSIS — C8590 Non-Hodgkin lymphoma, unspecified, unspecified site: Secondary | ICD-10-CM | POA: Diagnosis not present

## 2022-10-22 DIAGNOSIS — C8338 Diffuse large B-cell lymphoma, lymph nodes of multiple sites: Secondary | ICD-10-CM | POA: Insufficient documentation

## 2022-10-22 LAB — GLUCOSE, CAPILLARY: Glucose-Capillary: 82 mg/dL (ref 70–99)

## 2022-10-22 MED ORDER — FLUDEOXYGLUCOSE F - 18 (FDG) INJECTION
8.5000 | Freq: Once | INTRAVENOUS | Status: AC | PRN
  Administered 2022-10-22: 8.48 via INTRAVENOUS

## 2022-10-22 MED FILL — Dexamethasone Sodium Phosphate Inj 100 MG/10ML: INTRAMUSCULAR | Qty: 1 | Status: AC

## 2022-10-22 MED FILL — Fosaprepitant Dimeglumine For IV Infusion 150 MG (Base Eq): INTRAVENOUS | Qty: 5 | Status: AC

## 2022-10-23 ENCOUNTER — Inpatient Hospital Stay: Payer: 59

## 2022-10-23 ENCOUNTER — Inpatient Hospital Stay (HOSPITAL_BASED_OUTPATIENT_CLINIC_OR_DEPARTMENT_OTHER): Payer: 59 | Admitting: Physician Assistant

## 2022-10-23 VITALS — BP 122/85 | HR 66 | Temp 98.2°F | Resp 18

## 2022-10-23 DIAGNOSIS — C8338 Diffuse large B-cell lymphoma, lymph nodes of multiple sites: Secondary | ICD-10-CM | POA: Diagnosis not present

## 2022-10-23 DIAGNOSIS — Z5189 Encounter for other specified aftercare: Secondary | ICD-10-CM | POA: Diagnosis not present

## 2022-10-23 DIAGNOSIS — Z79899 Other long term (current) drug therapy: Secondary | ICD-10-CM | POA: Diagnosis not present

## 2022-10-23 DIAGNOSIS — Z5111 Encounter for antineoplastic chemotherapy: Secondary | ICD-10-CM

## 2022-10-23 DIAGNOSIS — Z7952 Long term (current) use of systemic steroids: Secondary | ICD-10-CM | POA: Diagnosis not present

## 2022-10-23 DIAGNOSIS — T451X5A Adverse effect of antineoplastic and immunosuppressive drugs, initial encounter: Secondary | ICD-10-CM | POA: Diagnosis not present

## 2022-10-23 DIAGNOSIS — D701 Agranulocytosis secondary to cancer chemotherapy: Secondary | ICD-10-CM | POA: Diagnosis not present

## 2022-10-23 DIAGNOSIS — Z5112 Encounter for antineoplastic immunotherapy: Secondary | ICD-10-CM | POA: Diagnosis not present

## 2022-10-23 DIAGNOSIS — Z79624 Long term (current) use of inhibitors of nucleotide synthesis: Secondary | ICD-10-CM | POA: Diagnosis not present

## 2022-10-23 DIAGNOSIS — Z8546 Personal history of malignant neoplasm of prostate: Secondary | ICD-10-CM | POA: Diagnosis not present

## 2022-10-23 DIAGNOSIS — Z95828 Presence of other vascular implants and grafts: Secondary | ICD-10-CM

## 2022-10-23 LAB — CMP (CANCER CENTER ONLY)
ALT: 23 U/L (ref 0–44)
AST: 22 U/L (ref 15–41)
Albumin: 4 g/dL (ref 3.5–5.0)
Alkaline Phosphatase: 138 U/L — ABNORMAL HIGH (ref 38–126)
Anion gap: 8 (ref 5–15)
BUN: 8 mg/dL (ref 8–23)
CO2: 28 mmol/L (ref 22–32)
Calcium: 9.2 mg/dL (ref 8.9–10.3)
Chloride: 107 mmol/L (ref 98–111)
Creatinine: 0.86 mg/dL (ref 0.61–1.24)
GFR, Estimated: >60 mL/min (ref >60)
Glucose, Bld: 138 mg/dL — ABNORMAL HIGH (ref 70–99)
Potassium: 3.5 mmol/L (ref 3.5–5.1)
Sodium: 143 mmol/L (ref 135–145)
Total Bilirubin: 0.4 mg/dL (ref 0.3–1.2)
Total Protein: 6.5 g/dL (ref 6.5–8.1)

## 2022-10-23 LAB — CBC WITH DIFFERENTIAL (CANCER CENTER ONLY)
Abs Immature Granulocytes: 0.01 10*3/uL (ref 0.00–0.07)
Basophils Absolute: 0 10*3/uL (ref 0.0–0.1)
Basophils Relative: 1 %
Eosinophils Absolute: 0 10*3/uL (ref 0.0–0.5)
Eosinophils Relative: 1 %
HCT: 37.6 % — ABNORMAL LOW (ref 39.0–52.0)
Hemoglobin: 12.5 g/dL — ABNORMAL LOW (ref 13.0–17.0)
Immature Granulocytes: 0 %
Lymphocytes Relative: 27 %
Lymphs Abs: 1.4 10*3/uL (ref 0.7–4.0)
MCH: 30.4 pg (ref 26.0–34.0)
MCHC: 33.2 g/dL (ref 30.0–36.0)
MCV: 91.5 fL (ref 80.0–100.0)
Monocytes Absolute: 0.7 10*3/uL (ref 0.1–1.0)
Monocytes Relative: 13 %
Neutro Abs: 3.1 10*3/uL (ref 1.7–7.7)
Neutrophils Relative %: 58 %
Platelet Count: 219 10*3/uL (ref 150–400)
RBC: 4.11 MIL/uL — ABNORMAL LOW (ref 4.22–5.81)
RDW: 14.5 % (ref 11.5–15.5)
WBC Count: 5.2 10*3/uL (ref 4.0–10.5)
nRBC: 0 % (ref 0.0–0.2)

## 2022-10-23 MED ORDER — SODIUM CHLORIDE 0.9 % IV SOLN: Freq: Once | INTRAVENOUS | Status: AC

## 2022-10-23 MED ORDER — DOXORUBICIN HCL CHEMO IV INJECTION 2 MG/ML
25.0000 mg/m2 | Freq: Once | INTRAVENOUS | Status: AC
  Administered 2022-10-23: 50 mg via INTRAVENOUS
  Filled 2022-10-23: qty 25

## 2022-10-23 MED ORDER — ACETAMINOPHEN 325 MG PO TABS
650.0000 mg | ORAL_TABLET | Freq: Once | ORAL | Status: AC
  Administered 2022-10-23: 650 mg via ORAL
  Filled 2022-10-23: qty 2

## 2022-10-23 MED ORDER — PALONOSETRON HCL INJECTION 0.25 MG/5ML
0.2500 mg | Freq: Once | INTRAVENOUS | Status: AC
  Administered 2022-10-23: 0.25 mg via INTRAVENOUS
  Filled 2022-10-23: qty 5

## 2022-10-23 MED ORDER — SODIUM CHLORIDE 0.9% FLUSH
10.0000 mL | INTRAVENOUS | Status: DC | PRN
  Administered 2022-10-23: 10 mL

## 2022-10-23 MED ORDER — SODIUM CHLORIDE 0.9 % IV SOLN
150.0000 mg | Freq: Once | INTRAVENOUS | Status: AC
  Administered 2022-10-23: 150 mg via INTRAVENOUS
  Filled 2022-10-23: qty 150

## 2022-10-23 MED ORDER — DIPHENHYDRAMINE HCL 25 MG PO CAPS
50.0000 mg | ORAL_CAPSULE | Freq: Once | ORAL | Status: AC
  Administered 2022-10-23: 50 mg via ORAL
  Filled 2022-10-23: qty 2

## 2022-10-23 MED ORDER — VINCRISTINE SULFATE CHEMO INJECTION 1 MG/ML
1.0000 mg | Freq: Once | INTRAVENOUS | Status: AC
  Administered 2022-10-23: 1 mg via INTRAVENOUS
  Filled 2022-10-23: qty 1

## 2022-10-23 MED ORDER — SODIUM CHLORIDE 0.9 % IV SOLN
10.0000 mg | Freq: Once | INTRAVENOUS | Status: AC
  Administered 2022-10-23: 10 mg via INTRAVENOUS
  Filled 2022-10-23: qty 10

## 2022-10-23 MED ORDER — HEPARIN SOD (PORK) LOCK FLUSH 100 UNIT/ML IV SOLN
500.0000 [IU] | Freq: Once | INTRAVENOUS | Status: AC | PRN
  Administered 2022-10-23: 500 [IU]

## 2022-10-23 MED ORDER — SODIUM CHLORIDE 0.9 % IV SOLN
400.0000 mg/m2 | Freq: Once | INTRAVENOUS | Status: AC
  Administered 2022-10-23: 800 mg via INTRAVENOUS
  Filled 2022-10-23: qty 40

## 2022-10-23 MED ORDER — SODIUM CHLORIDE 0.9 % IV SOLN
375.0000 mg/m2 | Freq: Once | INTRAVENOUS | Status: AC
  Administered 2022-10-23: 800 mg via INTRAVENOUS
  Filled 2022-10-23: qty 50

## 2022-10-23 NOTE — Progress Notes (Unsigned)
HEMATOLOGY ONCOLOGY CLINIC NOTE  Date of service: 10/23/22    Patient Care Team: Nechama Guard, FNP as PCP - General (Family Medicine) O'Neal, Ronnald Ramp, MD as PCP - Cardiology (Cardiology) Johney Maine, MD as Consulting Physician (Hematology)  SUMMARY OF ONCOLOGIC HISTORY: Oncology History  Diffuse large B-cell lymphoma of lymph nodes of multiple regions [facility])  07/25/2022 Initial Diagnosis   Diffuse large B-cell lymphoma of lymph nodes of multiple regions [facility])   08/06/2022 -  Chemotherapy   Patient is on Treatment Plan : NON-HODGKINS LYMPHOMA R-CHOP q21d      INTERVAL HISTORY:  Tyler [name] returns for a follow up for diffuse large B cell lymphoma. He was last seen by Dr. Candise Che on 10/04/2022 and completed cycle 3 of R-CHOP chemotherapy.  He is accompanied by his son-in-law.  Tyler [name] reports he is tolerating therapy without any significant limitations.  His appetite and energy are overall stable.  He denies any recent weight loss.  He denies nausea, vomiting or bowel habit changes.He reports trace blood in the urine which is unchanged from prior. He has no other signs of bleeding. He denies fevers, chills, sweats, shortness of breath, chest pain or cough. He has no other complaints.   REVIEW OF SYSTEMS:   10 Point review of Systems was done is negative except as noted above.   Past Medical History:  Diagnosis Date   Cancer [facility])    prostate   Follicular lymphoma (HCC) 2024   History of pulmonary embolism    Hypertension    Myelodysplastic syndrome (HCC)    PAF (paroxysmal atrial fibrillation) (HCC)    Recurrent UTI    Past Surgical History:  Procedure Laterality Date   BIOPSY  08/21/2022   Procedure: BIOPSY;  Surgeon: Beverley Fiedler, MD;  Location: [facility] ENDOSCOPY;  Service: Gastroenterology;;   ESOPHAGOGASTRODUODENOSCOPY (EGD) WITH PROPOFOL N/A 08/21/2022   Procedure: ESOPHAGOGASTRODUODENOSCOPY (EGD) WITH PROPOFOL;  Surgeon: Beverley Fiedler, MD;  Location: [facility]  ENDOSCOPY;  Service: Gastroenterology;  Laterality: N/A;   IR IMAGING GUIDED PORT INSERTION  08/26/2022   LYMPHADENECTOMY Bilateral 09/01/2019   Procedure: LYMPHADENECTOMY;  Surgeon: Sebastian Ache, MD;  Location: [facility] ORS;  Service: Urology;  Laterality: Bilateral;   ROBOT ASSISTED LAPAROSCOPIC RADICAL PROSTATECTOMY N/A 09/01/2019   Procedure: XI ROBOTIC ASSISTED LAPAROSCOPIC RADICAL PROSTATECTOMY;  Surgeon: Sebastian Ache, MD;  Location: [facility] ORS;  Service: Urology;  Laterality: N/A;  3 HRS   Social History   Tobacco Use   Smoking status: Never   Smokeless tobacco: Never  Vaping Use   Vaping status: Never Used  Substance Use Topics   Alcohol use: Yes    Comment: at least one 40 oz beer daily   Drug use: Never    ALLERGIES:  is allergic to amitriptyline, amlodipine, and neurontin [gabapentin].  MEDICATIONS:  Current Outpatient Medications  Medication Sig Dispense Refill   allopurinol (ZYLOPRIM) 100 MG tablet TAKE 1 TABLET BY MOUTH TWICE A DAY 180 tablet 1   B Complex Vitamins (VITAMIN B COMPLEX) TABS Take 1 tablet by mouth daily. 30 tablet 0   cholecalciferol (VITAMIN D3) 25 MCG (1000 UNIT) tablet Take 2 tablets (2,000 Units total) by mouth daily.     clotrimazole-betamethasone (LOTRISONE) cream APPLY TO AFFECTED AREA TWICE A DAY 45 g 0   diclofenac Sodium (VOLTAREN) 1 % GEL APPLY 4 G TOPICALLY 4 (FOUR) TIMES DAILY. APPLY TO RIGHT LATERAL HIP AND THIGH DOWN TO KNEE (Patient taking differently: Apply 4 g topically daily as needed (For  pain).) 300 g 1   folic acid (FOLVITE) 1 MG tablet Take 1 mg by mouth daily.     furosemide (LASIX) 20 MG tablet TAKE 1 TABLET BY MOUTH EVERY DAY 90 tablet 1   furosemide (LASIX) 40 MG tablet Take 40 mg by mouth daily.     KLOR-CON M20 20 MEQ tablet TAKE 1 TABLET BY MOUTH TWICE A DAY 180 tablet 1   lidocaine-prilocaine (EMLA) cream Apply to affected area once 30 g 3   Menthol-Methyl Salicylate (MUSCLE RUB) 10-15 % CREA Apply 1 Application topically daily  as needed for muscle pain.     metoprolol succinate (TOPROL XL) 25 MG 24 hr tablet Take 1 tablet (25 mg total) by mouth daily. 90 tablet 3   Multiple Vitamin (MULTIVITAMIN WITH MINERALS) TABS tablet Take 1 tablet by mouth daily.     ondansetron (ZOFRAN) 8 MG tablet Take 1 tablet (8 mg total) by mouth every 8 (eight) hours as needed for nausea or vomiting. Start on the third day after cyclophosphamide chemotherapy. 30 tablet 1   oxyCODONE (OXY IR/ROXICODONE) 5 MG immediate release tablet Take 1 tablet (5 mg total) by mouth every 6 (six) hours as needed for severe pain. 15 tablet 0   pantoprazole (PROTONIX) 40 MG tablet Take 1 tablet (40 mg total) by mouth 2 (two) times daily. For 12 weeks  \   predniSONE (DELTASONE) 50 MG tablet TAKE 1 TABLET BY MOUTH EVERY DAY WITH BREAKFAST FOR 5 DAYS STARTING THE DAY AFTER EACH CHEMOTHERAPY 25 tablet 0   Tenofovir Alafenamide Fumarate (VEMLIDY) 25 MG TABS Take 1 tablet (25 mg total) by mouth daily. 30 tablet    No current facility-administered medications for this visit.    PHYSICAL EXAMINATION: ECOG PERFORMANCE STATUS: 1 - Symptomatic but completely ambulatory VSS GENERAL:alert, in no acute distress and comfortable SKIN: no acute rashes, no significant lesions EYES: conjunctiva are pink and non-injected, sclera anicteric LUNGS: clear to auscultation b/l with normal respiratory effort HEART: regular rate & rhythm Extremity: no pedal edema PSYCH: alert & oriented x 3 with fluent speech NEURO: no focal motor/sensory deficits   LABORATORY DATA:   I have reviewed the data as listed .    Latest Ref Rng & Units 10/23/2022    8:08 AM 10/04/2022    9:31 AM 09/18/2022   10:35 AM  CBC  WBC 4.0 - 10.5 K/uL 5.2  7.0  8.9   Hemoglobin 13.0 - 17.0 g/dL 16.1  09.6  9.8   Hematocrit 39.0 - 52.0 % 37.6  33.2  30.8   Platelets 150 - 400 K/uL 219  163  126    .    Latest Ref Rng & Units 10/23/2022    8:08 AM 10/04/2022    9:31 AM 09/18/2022   10:35 AM  CMP   Glucose 70 - 99 mg/dL 045  409  811   BUN 8 - 23 mg/dL 8  7  7    Creatinine 0.61 - 1.24 mg/dL 9.14  7.82  9.56   Sodium 135 - 145 mmol/L 143  141  139   Potassium 3.5 - 5.1 mmol/L 3.5  3.5  3.3   Chloride 98 - 111 mmol/L 107  108  104   CO2 22 - 32 mmol/L 28  26  29    Calcium 8.9 - 10.3 mg/dL 9.2  8.7  8.6   Total Protein 6.5 - 8.1 g/dL 6.5  6.2  5.7   Total Bilirubin 0.3 - 1.2 mg/dL 0.4  0.5  0.4   Alkaline Phos 38 - 126 U/L 138  131  184   AST 15 - 41 U/L 22  19  15    ALT 0 - 44 U/L 23  17  16     Lymph node biopsy 07/16/2022    RADIOGRAPHIC STUDIES: I have personally reviewed the radiological images as listed and agreed with the findings in the report. NM PET Image Restag (PS) Skull Base To Thigh  Result Date: 10/22/2022 CLINICAL DATA:  Subsequent treatment strategy for large B-cell lymphoma. Status post 3 cycles of chemo immunotherapy. EXAM: NUCLEAR MEDICINE PET SKULL BASE TO THIGH TECHNIQUE: 8.5 mCi F-18 FDG was injected intravenously. Full-ring PET imaging was performed from the skull base to thigh after the radiotracer. CT data was obtained and used for attenuation correction and anatomic localization. Fasting blood glucose: 82 mg/dl COMPARISON:  CT chest dated 08/03/2022. CT abdomen/pelvis dated 07/29/2022. FINDINGS: Mediastinal blood pool activity: SUV max 2.5 Liver activity: SUV max 3.9 NECK: Focal hypermetabolism along the deep lobe of the left parotid gland, without convincing nodule/node on CT. Max SUV 7.1. This is considered indeterminate. Incidental CT findings: None. CHEST: Small bilateral axillary nodes, including 11 mm short axis right axillary node (series 4/image 7) with max SUV 3.1 and a 10 mm short axis left axillary node (series 4/image 85) with max SUV 3.4. No hypermetabolic mediastinal lymphadenopathy. No suspicious pulmonary nodules. Right chest port terminates in the upper right atrium. Incidental CT findings: Mild coronary atherosclerosis of the LAD. ABDOMEN/PELVIS:  No abnormal hypermetabolism in the liver, pancreas, or adrenal glands. Spleen is normal in size without focal lesion. Reference max SUV 2.7. No hypermetabolic abdominopelvic lymphadenopathy. Incidental CT findings: Bilateral renal cysts measuring up to 3.6 cm (series 4/image 21), measuring simple fluid density, benign (Bosniak I). No follow-up is recommended. SKELETON: No focal hypermetabolic activity to suggest skeletal metastasis/osseous lymphoma. Incidental CT findings: Mild degenerative changes of the visualized thoracolumbar spine. IMPRESSION: Near complete metabolic response. Small bilateral axillary nodes, as above.  Deauville criteria 3. Focal hypermetabolism along the deep lobe of the left parotid gland, without convincing nodule/node on CT, indeterminate. Attention on follow-up is suggested. Otherwise, no findings suspicious for active lymphoma. Electronically Signed   By: Charline Bills M.D.   On: 10/22/2022 17:31   ECHOCARDIOGRAM COMPLETE  Result Date: 10/03/2022    ECHOCARDIOGRAM REPORT   Patient Name:   Tyler [name] Date of Exam: 10/03/2022 Medical Rec #:  [id number]    Height:       67.0 in Accession #:    [id number]   Weight:       169.0 lb Date of Birth:  October 01, 1950    BSA:          1.883 m Patient Age:    72 years     BP:           110/64 mmHg Patient Gender: M            HR:           65 bpm. Exam Location:  Outpatient Procedure: 2D Echo, 3D Echo, Cardiac Doppler, Color Doppler and Strain Analysis Indications:    Bilateral lower extremity edema [720947]                 Paroxysmal atrial fibrillation (HCC) [[id number]]  History:        Patient has prior history of Echocardiogram examinations, most  recent 07/29/2022. Arrythmias:Atrial Fibrillation,                 Signs/Symptoms:Edema; Risk Factors:Hypertension.  Sonographer:    Eulah Pont RDCS Referring Phys: [id number] Ronnald Ramp O'NEAL  Sonographer Comments: Global longitudinal strain was attempted. IMPRESSIONS  1. Left  ventricular ejection fraction, by estimation, is 60 to 65%. The left ventricle has normal function. The left ventricle has no regional wall motion abnormalities. There is mild concentric left ventricular hypertrophy. Left ventricular diastolic parameters are consistent with Grade I diastolic dysfunction (impaired relaxation). The average left ventricular global longitudinal strain is -21.9 %. The global longitudinal strain is normal.  2. Right ventricular systolic function is normal. The right ventricular size is normal. Tricuspid regurgitation signal is inadequate for assessing PA pressure.  3. The mitral valve is normal in structure. Mild mitral valve regurgitation. No evidence of mitral stenosis.  4. The aortic valve is tricuspid. Aortic valve regurgitation is not visualized. No aortic stenosis is present.  5. The inferior vena cava is normal in size with greater than 50% respiratory variability, suggesting right atrial pressure of 3 mmHg. FINDINGS  Left Ventricle: Left ventricular ejection fraction, by estimation, is 60 to 65%. The left ventricle has normal function. The left ventricle has no regional wall motion abnormalities. The average left ventricular global longitudinal strain is -21.9 %. The global longitudinal strain is normal. The left ventricular internal cavity size was normal in size. There is mild concentric left ventricular hypertrophy. Left ventricular diastolic parameters are consistent with Grade I diastolic dysfunction (impaired relaxation). Right Ventricle: The right ventricular size is normal. Right ventricular systolic function is normal. Tricuspid regurgitation signal is inadequate for assessing PA pressure. The tricuspid regurgitant velocity is 2.47 m/s, and with an assumed right atrial  pressure of 3 mmHg, the estimated right ventricular systolic pressure is 27.4 mmHg. Left Atrium: Left atrial size was normal in size. Right Atrium: Right atrial size was normal in size. Pericardium: There  is no evidence of pericardial effusion. Mitral Valve: The mitral valve is normal in structure. Mild mitral valve regurgitation. No evidence of mitral valve stenosis. Tricuspid Valve: The tricuspid valve is normal in structure. Tricuspid valve regurgitation is trivial. No evidence of tricuspid stenosis. Aortic Valve: The aortic valve is tricuspid. Aortic valve regurgitation is not visualized. No aortic stenosis is present. Pulmonic Valve: The pulmonic valve was normal in structure. Pulmonic valve regurgitation is trivial. No evidence of pulmonic stenosis. Aorta: The aortic root is normal in size and structure. Venous: The inferior vena cava is normal in size with greater than 50% respiratory variability, suggesting right atrial pressure of 3 mmHg. IAS/Shunts: No atrial level shunt detected by color flow Doppler.  LEFT VENTRICLE PLAX 2D LVIDd:         3.90 cm     Diastology LVIDs:         2.20 cm     LV e' medial:    5.40 cm/s LV PW:         0.90 cm     LV E/e' medial:  12.9 LV IVS:        0.90 cm     LV e' lateral:   8.85 cm/s LVOT diam:     2.10 cm     LV E/e' lateral: 7.9 LV SV:         79 LV SV Index:   42          2D Longitudinal Strain LVOT Area:     3.46  cm    2D Strain GLS Avg:     -21.9 %  LV Volumes (MOD) LV vol d, MOD A2C: 80.9 ml 3D Volume EF: LV vol d, MOD A4C: 79.2 ml 3D EF:        55 % LV vol s, MOD A2C: 30.9 ml LV EDV:       133 ml LV vol s, MOD A4C: 26.5 ml LV ESV:       60 ml LV SV MOD A2C:     50.0 ml LV SV:        74 ml LV SV MOD A4C:     79.2 ml LV SV MOD BP:      51.4 ml RIGHT VENTRICLE RV S prime:     12.30 cm/s TAPSE (M-mode): 1.7 cm LEFT ATRIUM             Index        RIGHT ATRIUM           Index LA diam:        3.10 cm 1.65 cm/m   RA Area:     11.60 cm LA Vol (A2C):   58.6 ml 31.13 ml/m  RA Volume:   24.00 ml  12.75 ml/m LA Vol (A4C):   39.9 ml 21.19 ml/m LA Biplane Vol: 48.7 ml 25.87 ml/m  AORTIC VALVE LVOT Vmax:   115.00 cm/s LVOT Vmean:  71.100 cm/s LVOT VTI:    0.229 m  AORTA Ao  Root diam: 3.40 cm Ao Asc diam:  3.60 cm MITRAL VALVE                  TRICUSPID VALVE MV Area (PHT): 3.27 cm       TR Peak grad:   24.4 mmHg MV Decel Time: 232 msec       TR Vmax:        247.00 cm/s MR Peak grad:    110.2 mmHg MR Mean grad:    81.0 mmHg    SHUNTS MR Vmax:         525.00 cm/s  Systemic VTI:  0.23 m MR Vmean:        423.0 cm/s   Systemic Diam: 2.10 cm MR PISA:         0.25 cm MR PISA Eff ROA: 2 mm MR PISA Radius:  0.20 cm MV E velocity: 69.80 cm/s MV A velocity: 63.60 cm/s MV E/A ratio:  1.10 Olga Millers MD Electronically signed by Olga Millers MD Signature Date/Time: 10/03/2022/9:23:33 AM    Final     ASSESSMENT & PLAN:  Truman Tudisco is a 72 y.o. male who returns for a follow up for high grade large B cell lymphoma   # High grade large b cell lymphoma  -No evidence of double hit or triple hit lymphoma. -Baseline ECHO from 07/29/2022 EFT >75%.  -Started R-mini-CHOP on 08/06/2022. Rituxan was held with Cycle 1 due to significant tumor lysis and multifactorial AKI.  #Paroxysmal A fib -Not a candidate for anticoag due to anemia and thrombocytopenia  #Anemia and mild thrombocytopenia -- likely multifactorial -- lymphoma + MDS+ >?sepsis r/o GI bleeding.   --And now due to his chemotherapy. --Monitor for now.   #H/O acute kidney injury: -Possibly related to prerenal causes versus contrast nephropathy versus high risk of tumor lysis syndrome. -Renal ultrasound with no obstruction -Received 1 dose of rasburicase on 6/12 and 1 dose of rasburicase 6 mg on 08/08/22. -Currently on CRRT.   PLAN: -Due for  Cycle 4 of R-CHOP -Reviewed labs from today with patient. WBC 5.2, Hgb 12.5, Plt 219. Creatinine and LFTs normal.  -Mid-treatment PET scan showed near complete metabolic response. There are small bilateral axillary nodes, Deauville criteria 3.  -Proceed with treatment today without any dose modifications.    FOLLOW-UP: 11/15/2022: Labs, follow up before Cycle 5.   All of the  patient's questions were answered with apparent satisfaction. The patient knows to call the clinic with any problems, questions or concerns.  I have spent a total of 30 minutes minutes of face-to-face and non-face-to-face time, preparing to see the patient, performing a medically appropriate examination, counseling and educating the patient, documenting clinical information in the electronic health record,  and care coordination.   Georga Kaufmann PA-C Dept of Hematology and Oncology [facility] Phone: [phone]

## 2022-10-23 NOTE — Patient Instructions (Signed)
[place]  Discharge Instructions: Thank you for choosing [facility] to provide your oncology and hematology care.   If you have a lab appointment with the [facility], please go directly to the [facility] and check in at the registration area.   Wear comfortable clothing and clothing appropriate for easy access to any Portacath or PICC line.   We strive to give you quality time with your provider. You may need to reschedule your appointment if you arrive late (15 or more minutes).  Arriving late affects you and other patients whose appointments are after yours.  Also, if you miss three or more appointments without notifying the office, you may be dismissed from the clinic at the provider's discretion.      For prescription refill requests, have your pharmacy contact our office and allow 72 hours for refills to be completed.    Today you received the following chemotherapy and/or immunotherapy agents: doxorubicin, vincristine, cyclophosphamide, rituximab      To help prevent nausea and vomiting after your treatment, we encourage you to take your nausea medication as directed.  BELOW ARE SYMPTOMS THAT SHOULD BE REPORTED IMMEDIATELY: *FEVER GREATER THAN 100.4 F (38 C) OR HIGHER *CHILLS OR SWEATING *NAUSEA AND VOMITING THAT IS NOT CONTROLLED WITH YOUR NAUSEA MEDICATION *UNUSUAL SHORTNESS OF BREATH *UNUSUAL BRUISING OR BLEEDING *URINARY PROBLEMS (pain or burning when urinating, or frequent urination) *BOWEL PROBLEMS (unusual diarrhea, constipation, pain near the anus) TENDERNESS IN MOUTH AND THROAT WITH OR WITHOUT PRESENCE OF ULCERS (sore throat, sores in mouth, or a toothache) UNUSUAL RASH, SWELLING OR PAIN  UNUSUAL VAGINAL DISCHARGE OR ITCHING   Items with * indicate a potential emergency and should be followed up as soon as possible or go to the Emergency Department if any problems should occur.  Please show the CHEMOTHERAPY  ALERT CARD or IMMUNOTHERAPY ALERT CARD at check-in to the Emergency Department and triage nurse.  Should you have questions after your visit or need to cancel or reschedule your appointment, please contact [facility]  Dept: [phone]  and follow the prompts.  Office hours are 8:00 a.m. to 4:30 p.m. Monday - Friday. Please note that voicemails left after 4:00 p.m. may not be returned until the following business day.  We are closed weekends and major holidays. You have access to a nurse at all times for urgent questions. Please call the main number to the clinic Dept: [phone] and follow the prompts.   For any non-urgent questions, you may also contact your provider using MyChart. We now offer e-Visits for anyone 45 and older to request care online for non-urgent symptoms. For details visit [email or web address].   Also download the MyChart app! Go to the app store, search "MyChart", open the app, select [facility], and log in with your MyChart username and password.

## 2022-10-24 ENCOUNTER — Other Ambulatory Visit: Payer: Self-pay

## 2022-10-24 ENCOUNTER — Telehealth: Payer: Self-pay

## 2022-10-24 ENCOUNTER — Encounter: Payer: Self-pay | Admitting: Hematology

## 2022-10-24 NOTE — Telephone Encounter (Signed)
Received a call from patient's daughter stating that he needs a refill on his [place] sent to [facility] in [place], [place].

## 2022-10-25 ENCOUNTER — Other Ambulatory Visit: Payer: Self-pay

## 2022-10-25 ENCOUNTER — Inpatient Hospital Stay: Payer: 59

## 2022-10-25 VITALS — BP 139/82 | HR 60 | Temp 98.2°F | Resp 18

## 2022-10-25 DIAGNOSIS — Z5189 Encounter for other specified aftercare: Secondary | ICD-10-CM | POA: Diagnosis not present

## 2022-10-25 DIAGNOSIS — Z79624 Long term (current) use of inhibitors of nucleotide synthesis: Secondary | ICD-10-CM | POA: Diagnosis not present

## 2022-10-25 DIAGNOSIS — Z5112 Encounter for antineoplastic immunotherapy: Secondary | ICD-10-CM | POA: Diagnosis not present

## 2022-10-25 DIAGNOSIS — C8338 Diffuse large B-cell lymphoma, lymph nodes of multiple sites: Secondary | ICD-10-CM

## 2022-10-25 DIAGNOSIS — Z7952 Long term (current) use of systemic steroids: Secondary | ICD-10-CM | POA: Diagnosis not present

## 2022-10-25 DIAGNOSIS — Z8546 Personal history of malignant neoplasm of prostate: Secondary | ICD-10-CM | POA: Diagnosis not present

## 2022-10-25 DIAGNOSIS — T451X5A Adverse effect of antineoplastic and immunosuppressive drugs, initial encounter: Secondary | ICD-10-CM | POA: Diagnosis not present

## 2022-10-25 DIAGNOSIS — Z79899 Other long term (current) drug therapy: Secondary | ICD-10-CM | POA: Diagnosis not present

## 2022-10-25 DIAGNOSIS — Z5111 Encounter for antineoplastic chemotherapy: Secondary | ICD-10-CM

## 2022-10-25 DIAGNOSIS — D701 Agranulocytosis secondary to cancer chemotherapy: Secondary | ICD-10-CM | POA: Diagnosis not present

## 2022-10-25 LAB — URINALYSIS, COMPLETE (UACMP) WITH MICROSCOPIC
Bacteria, UA: NONE SEEN
Bilirubin Urine: NEGATIVE
Glucose, UA: NEGATIVE mg/dL
Hgb urine dipstick: NEGATIVE
Ketones, ur: NEGATIVE mg/dL
Leukocytes,Ua: NEGATIVE
Nitrite: NEGATIVE
Protein, ur: NEGATIVE mg/dL
Specific Gravity, Urine: 1.015 (ref 1.005–1.030)
pH: 6 (ref 5.0–8.0)

## 2022-10-25 MED ORDER — PEGFILGRASTIM-JMDB 6 MG/0.6ML ~~LOC~~ SOSY
6.0000 mg | PREFILLED_SYRINGE | Freq: Once | SUBCUTANEOUS | Status: AC
  Administered 2022-10-25: 6 mg via SUBCUTANEOUS
  Filled 2022-10-25: qty 0.6

## 2022-10-25 NOTE — Patient Instructions (Signed)

## 2022-10-25 NOTE — Progress Notes (Signed)
Pt. Here for Fulphila injection!  Injection given, then pt. States the last time he was given the injection he had itchiness and numbness of the face.  Secure chatted Dr. Candise Che and Waynetta Sandy Wright/RN. Then pt. C/O frequent urination.  Dr. Candise Che states to do UA and C&S.  Sample collected.

## 2022-10-26 LAB — URINE CULTURE: Culture: NO GROWTH

## 2022-10-29 ENCOUNTER — Other Ambulatory Visit: Payer: Self-pay

## 2022-10-29 ENCOUNTER — Other Ambulatory Visit (HOSPITAL_COMMUNITY): Payer: Self-pay

## 2022-10-29 ENCOUNTER — Encounter: Payer: Self-pay | Admitting: Internal Medicine

## 2022-10-29 ENCOUNTER — Ambulatory Visit (INDEPENDENT_AMBULATORY_CARE_PROVIDER_SITE_OTHER): Payer: 59 | Admitting: Internal Medicine

## 2022-10-29 VITALS — BP 134/84 | HR 64 | Temp 97.5°F | Wt 176.0 lb

## 2022-10-29 DIAGNOSIS — B181 Chronic viral hepatitis B without delta-agent: Secondary | ICD-10-CM | POA: Diagnosis not present

## 2022-10-29 MED ORDER — VEMLIDY 25 MG PO TABS: 25.0000 mg | ORAL_TABLET | Freq: Every day | ORAL | 5 refills | Status: DC

## 2022-10-29 NOTE — Progress Notes (Signed)
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   Acute renal failure with tubular necrosis (HCC) 08/08/2022   Aspiration into airway 08/08/2022   At high risk of tumor lysis syndrome 08/07/2022   Anemia 08/06/2022   Diffuse large B-cell lymphoma of lymph nodes of multiple regions (HCC) 07/25/2022   Encounter for antineoplastic chemotherapy 07/25/2022   Lumbar radiculopathy 05/31/2022   Symptomatic anemia    UTI (urinary tract infection) 12/10/2021   Septic shock (HCC) 12/10/2021   Normocytic anemia 12/10/2021   Thrombocytopenia (HCC) 12/10/2021   Hyponatremia 12/10/2021   Sepsis secondary to UTI (HCC) 12/10/2021   Pelvic hematoma in male 09/08/2019   Bilateral pulmonary embolism (HCC) 09/05/2019   Paroxysmal atrial fibrillation (HCC) 09/05/2019   Hypertension    Hypokalemia    Prostate cancer (HCC) 09/01/2019    Patient's Medications  New Prescriptions   No medications on file  Previous Medications   ALLOPURINOL (ZYLOPRIM) 100 MG TABLET    TAKE 1 TABLET BY MOUTH TWICE A DAY   B COMPLEX VITAMINS (VITAMIN B COMPLEX) TABS    Take 1 tablet by mouth daily.   CHOLECALCIFEROL (VITAMIN D3) 25 MCG (1000 UNIT) TABLET    Take 2 tablets (2,000 Units total) by mouth daily.   CLOTRIMAZOLE-BETAMETHASONE (LOTRISONE) CREAM    APPLY TO AFFECTED AREA TWICE A DAY   DICLOFENAC SODIUM (VOLTAREN) 1 % GEL    APPLY 4 G TOPICALLY 4 (FOUR) TIMES DAILY. APPLY TO RIGHT LATERAL HIP AND THIGH DOWN TO KNEE   FOLIC ACID (FOLVITE) 1 MG TABLET    Take 1 mg by mouth daily.   FUROSEMIDE (LASIX) 20 MG TABLET    TAKE 1 TABLET BY MOUTH EVERY DAY   FUROSEMIDE (LASIX) 40 MG TABLET    Take 40 mg by mouth daily.   KLOR-CON M20 20 MEQ TABLET    TAKE 1  TABLET BY MOUTH TWICE A DAY   LIDOCAINE-PRILOCAINE (EMLA) CREAM    Apply to affected area once   MENTHOL-METHYL SALICYLATE (MUSCLE RUB) 10-15 % CREA    Apply 1 Application topically daily as needed for muscle pain.   METOPROLOL SUCCINATE (TOPROL XL) 25 MG 24 HR TABLET    Take 1 tablet (25 mg total) by mouth daily.   MULTIPLE VITAMIN (MULTIVITAMIN WITH MINERALS) TABS TABLET    Take 1 tablet by mouth daily.   ONDANSETRON (ZOFRAN) 8 MG TABLET    Take 1 tablet (8 mg total) by mouth every 8 (eight) hours as needed for nausea or vomiting. Start on the third day after cyclophosphamide chemotherapy.   OXYCODONE (OXY IR/ROXICODONE) 5 MG IMMEDIATE RELEASE TABLET    Take 1 tablet (5 mg total) by mouth every 6 (six) hours as needed for severe pain.   PANTOPRAZOLE (PROTONIX) 40 MG TABLET    Take 1 tablet (40 mg total) by mouth 2 (two) times daily. For 12 weeks   PREDNISONE (DELTASONE) 50 MG TABLET    TAKE 1 TABLET BY MOUTH EVERY DAY WITH BREAKFAST FOR 5 DAYS STARTING THE DAY AFTER EACH CHEMOTHERAPY   TENOFOVIR ALAFENAMIDE FUMARATE (VEMLIDY) 25 MG TABS    Take 1 tablet (25 mg total) by mouth daily.  Modified Medications   No medications  on file  Discontinued Medications   No medications on file    Subjective: 72 year old male with history of low-grade myelodysplastic syndrome found to have generalized lymphadenopathy from high-grade large B-cell lymphoma started on R-CHOP on 6/10 presents due to heme-onc request of ID involvement.  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  Noted Rituxan can increase chances of hep B reactivation.  He was started on tenofovir while on rituximab.  Noted to be moderate risk given hep B surface antigen negative, core antibody positive receiving anti-CD20 medication.  Recommend continued treatment concurrently while on Pranau suppressive therapy.  Today 10/29/22: PT presents with family. Notes last dose of TAF was on Sunday. His pharmacy did not have it, needs it from  specialty pharmacy.  Review of Systems: Review of Systems  All other systems reviewed and are negative.   Past Medical History:  Diagnosis Date   Cancer [facility])    prostate   Follicular lymphoma (HCC) 2024   History of pulmonary embolism    Hypertension    Myelodysplastic syndrome (HCC)    PAF (paroxysmal atrial fibrillation) (HCC)    Recurrent UTI     Social History   Tobacco Use   Smoking status: Never   Smokeless tobacco: Never  Vaping Use   Vaping status: Never Used  Substance Use Topics   Alcohol use: Yes    Comment: at least one 40 oz beer daily   Drug use: Never    Family History  Problem Relation Age of Onset   Stroke Father     Allergies  Allergen Reactions   Amitriptyline Other (See Comments)    Nightmares, Night sweats, Migraines    Amlodipine Swelling    BILATERAL FEET TO ANKLES   Neurontin [Gabapentin] Other (See Comments)    Nightmares; Night sweats; Headache    Health Maintenance  Topic Date Due   COVID-19 Vaccine (1) Never done   Pneumonia Vaccine 70+ Years old (1 of 2 - PCV) Never done   DTaP/Tdap/Td (1 - Tdap) Never done   Zoster Vaccines- Shingrix (1 of 2) Never done   Colonoscopy  Never done   INFLUENZA VACCINE  Never done   Hepatitis C Screening  Completed   HPV VACCINES  Aged Out    Objective:  There were no vitals filed for this visit. There is no height or weight on file to calculate BMI.  Physical Exam Constitutional:      General: He is not in acute distress.    Appearance: He is normal weight. He is not toxic-appearing.  HENT:     Head: Normocephalic and atraumatic.     Right Ear: External ear normal.     Left Ear: External ear normal.     Nose: No congestion or rhinorrhea.     Mouth/Throat:     Mouth: Mucous membranes are moist.     Pharynx: Oropharynx is clear.  Eyes:     Extraocular Movements: Extraocular movements intact.     Conjunctiva/sclera: Conjunctivae normal.     Pupils: Pupils are equal, round, and  reactive to light.  Cardiovascular:     Rate and Rhythm: Normal rate and regular rhythm.     Heart sounds: No murmur heard.    No friction rub. No gallop.  Pulmonary:     Effort: Pulmonary effort is normal.     Breath sounds: Normal breath sounds.  Abdominal:     General: Abdomen is flat. Bowel sounds are normal.     Palpations:  Abdomen is soft.  Musculoskeletal:        General: No swelling. Normal range of motion.     Cervical back: Normal range of motion and neck supple.  Skin:    General: Skin is warm and dry.  Neurological:     General: No focal deficit present.     Mental Status: He is oriented to person, place, and time.  Psychiatric:        Mood and Affect: Mood normal.     Lab Results Lab Results  Component Value Date   WBC 5.2 10/23/2022   HGB 12.5 (L) 10/23/2022   HCT 37.6 (L) 10/23/2022   MCV 91.5 10/23/2022   PLT 219 10/23/2022    Lab Results  Component Value Date   CREATININE 0.86 10/23/2022   BUN 8 10/23/2022   NA 143 10/23/2022   K 3.5 10/23/2022   CL 107 10/23/2022   CO2 28 10/23/2022    Lab Results  Component Value Date   ALT 23 10/23/2022   AST 22 10/23/2022   ALKPHOS 138 (H) 10/23/2022   BILITOT 0.4 10/23/2022    No results found for: "CHOL", "HDL", "LDLCALC", "LDLDIRECT", "TRIG", "CHOLHDL" No results found for: "LABRPR", "RPRTITER" No results found for: "HIV1RNAQUANT", "HIV1RNAVL", "CD4TABS"   Problem List Items Addressed This Visit   None  Assessment/Plan #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 ID was engaged (sAG and SAB negative, HBV DNA neg). Started TAF on 08/21/22, continue x 12 months after stopping anti-CD20, since lag in recovery of B cell function -Pt's last dose of Taf was Sunday(ran out and needs it from specialty pharmacy). [facility] and number provided to call pharmacy to ensure it is sent.  -Labs today: HepB sAG, ALT/AST wnl on 8/28, HBV DNA(ND on 08/05/22) - F/U in 3 months  Danelle Earthly, MD [facility] 10/29/2022, 11:42 AM   I have personally spent 45 minutes involved in face-to-face and non-face-to-face activities for this patient on the day of the visit. Professional time spent includes the following activities: Preparing to see the patient (review of tests), Obtaining and/or reviewing separately obtained history (admission/discharge record), Performing a medically appropriate examination and/or evaluation , Ordering medications/tests/procedures, referring and communicating with other health care professionals, Documenting clinical information in the EMR, Independently interpreting results (not separately reported), Communicating results to the patient/family/caregiver, Counseling and educating the patient/family/caregiver and Care coordination (not separately reported).

## 2022-10-29 NOTE — Patient Instructions (Signed)
[facility] - [address]   [address] [place], [place] [address]   Phone:  [phone]

## 2022-10-31 LAB — HEPATITIS B DNA, ULTRAQUANTITATIVE, PCR
Hepatitis B DNA: NOT DETECTED [IU]/mL
Hepatitis B virus DNA: NOT DETECTED {Log_IU}/mL

## 2022-10-31 LAB — HEPATITIS B SURFACE ANTIGEN: Hepatitis B Surface Ag: NONREACTIVE

## 2022-11-14 ENCOUNTER — Other Ambulatory Visit: Payer: Self-pay

## 2022-11-14 DIAGNOSIS — D649 Anemia, unspecified: Secondary | ICD-10-CM

## 2022-11-14 MED FILL — Fosaprepitant Dimeglumine For IV Infusion 150 MG (Base Eq): INTRAVENOUS | Qty: 5 | Status: AC

## 2022-11-14 MED FILL — Dexamethasone Sodium Phosphate Inj 100 MG/10ML: INTRAMUSCULAR | Qty: 1 | Status: AC

## 2022-11-15 ENCOUNTER — Inpatient Hospital Stay: Payer: 59 | Attending: Hematology

## 2022-11-15 ENCOUNTER — Inpatient Hospital Stay (HOSPITAL_BASED_OUTPATIENT_CLINIC_OR_DEPARTMENT_OTHER): Payer: 59 | Admitting: Hematology

## 2022-11-15 ENCOUNTER — Inpatient Hospital Stay: Payer: 59

## 2022-11-15 VITALS — BP 142/90 | HR 63 | Resp 18

## 2022-11-15 DIAGNOSIS — Z5111 Encounter for antineoplastic chemotherapy: Secondary | ICD-10-CM | POA: Diagnosis not present

## 2022-11-15 DIAGNOSIS — C8338 Diffuse large B-cell lymphoma, lymph nodes of multiple sites: Secondary | ICD-10-CM

## 2022-11-15 DIAGNOSIS — D696 Thrombocytopenia, unspecified: Secondary | ICD-10-CM | POA: Insufficient documentation

## 2022-11-15 DIAGNOSIS — Z8546 Personal history of malignant neoplasm of prostate: Secondary | ICD-10-CM | POA: Insufficient documentation

## 2022-11-15 DIAGNOSIS — I480 Paroxysmal atrial fibrillation: Secondary | ICD-10-CM | POA: Diagnosis not present

## 2022-11-15 DIAGNOSIS — T451X5A Adverse effect of antineoplastic and immunosuppressive drugs, initial encounter: Secondary | ICD-10-CM | POA: Diagnosis not present

## 2022-11-15 DIAGNOSIS — Z79624 Long term (current) use of inhibitors of nucleotide synthesis: Secondary | ICD-10-CM | POA: Insufficient documentation

## 2022-11-15 DIAGNOSIS — Z5189 Encounter for other specified aftercare: Secondary | ICD-10-CM | POA: Insufficient documentation

## 2022-11-15 DIAGNOSIS — Z86711 Personal history of pulmonary embolism: Secondary | ICD-10-CM | POA: Insufficient documentation

## 2022-11-15 DIAGNOSIS — Z7952 Long term (current) use of systemic steroids: Secondary | ICD-10-CM | POA: Insufficient documentation

## 2022-11-15 DIAGNOSIS — Z95828 Presence of other vascular implants and grafts: Secondary | ICD-10-CM

## 2022-11-15 DIAGNOSIS — Z791 Long term (current) use of non-steroidal anti-inflammatories (NSAID): Secondary | ICD-10-CM | POA: Diagnosis not present

## 2022-11-15 DIAGNOSIS — Z79899 Other long term (current) drug therapy: Secondary | ICD-10-CM | POA: Diagnosis not present

## 2022-11-15 DIAGNOSIS — D649 Anemia, unspecified: Secondary | ICD-10-CM | POA: Diagnosis not present

## 2022-11-15 DIAGNOSIS — D701 Agranulocytosis secondary to cancer chemotherapy: Secondary | ICD-10-CM | POA: Diagnosis not present

## 2022-11-15 DIAGNOSIS — Z5112 Encounter for antineoplastic immunotherapy: Secondary | ICD-10-CM | POA: Diagnosis not present

## 2022-11-15 LAB — CBC WITH DIFFERENTIAL (CANCER CENTER ONLY)
Abs Immature Granulocytes: 0.01 10*3/uL (ref 0.00–0.07)
Basophils Absolute: 0.1 10*3/uL (ref 0.0–0.1)
Basophils Relative: 1 %
Eosinophils Absolute: 0 10*3/uL (ref 0.0–0.5)
Eosinophils Relative: 1 %
HCT: 37.7 % — ABNORMAL LOW (ref 39.0–52.0)
Hemoglobin: 12.8 g/dL — ABNORMAL LOW (ref 13.0–17.0)
Immature Granulocytes: 0 %
Lymphocytes Relative: 43 %
Lymphs Abs: 2.2 10*3/uL (ref 0.7–4.0)
MCH: 30.5 pg (ref 26.0–34.0)
MCHC: 34 g/dL (ref 30.0–36.0)
MCV: 89.8 fL (ref 80.0–100.0)
Monocytes Absolute: 1.5 10*3/uL — ABNORMAL HIGH (ref 0.1–1.0)
Monocytes Relative: 29 %
Neutro Abs: 1.3 10*3/uL — ABNORMAL LOW (ref 1.7–7.7)
Neutrophils Relative %: 26 %
Platelet Count: 197 10*3/uL (ref 150–400)
RBC: 4.2 MIL/uL — ABNORMAL LOW (ref 4.22–5.81)
RDW: 13.8 % (ref 11.5–15.5)
WBC Count: 5.1 10*3/uL (ref 4.0–10.5)
nRBC: 0 % (ref 0.0–0.2)

## 2022-11-15 LAB — CMP (CANCER CENTER ONLY)
ALT: 31 U/L (ref 0–44)
AST: 26 U/L (ref 15–41)
Albumin: 4 g/dL (ref 3.5–5.0)
Alkaline Phosphatase: 92 U/L (ref 38–126)
Anion gap: 6 (ref 5–15)
BUN: 12 mg/dL (ref 8–23)
CO2: 28 mmol/L (ref 22–32)
Calcium: 9 mg/dL (ref 8.9–10.3)
Chloride: 107 mmol/L (ref 98–111)
Creatinine: 0.82 mg/dL (ref 0.61–1.24)
GFR, Estimated: >60 mL/min (ref >60)
Glucose, Bld: 106 mg/dL — ABNORMAL HIGH (ref 70–99)
Potassium: 3.4 mmol/L — ABNORMAL LOW (ref 3.5–5.1)
Sodium: 141 mmol/L (ref 135–145)
Total Bilirubin: 0.3 mg/dL (ref 0.3–1.2)
Total Protein: 6.4 g/dL — ABNORMAL LOW (ref 6.5–8.1)

## 2022-11-15 MED ORDER — SODIUM CHLORIDE 0.9 % IV SOLN
375.0000 mg/m2 | Freq: Once | INTRAVENOUS | Status: AC
  Administered 2022-11-15: 800 mg via INTRAVENOUS
  Filled 2022-11-15: qty 50

## 2022-11-15 MED ORDER — SODIUM CHLORIDE 0.9% FLUSH
10.0000 mL | INTRAVENOUS | Status: DC | PRN
  Administered 2022-11-15: 10 mL

## 2022-11-15 MED ORDER — VINCRISTINE SULFATE CHEMO INJECTION 1 MG/ML
1.0000 mg | Freq: Once | INTRAVENOUS | Status: AC
  Administered 2022-11-15: 1 mg via INTRAVENOUS
  Filled 2022-11-15: qty 1

## 2022-11-15 MED ORDER — SODIUM CHLORIDE 0.9 % IV SOLN: Freq: Once | INTRAVENOUS | Status: AC

## 2022-11-15 MED ORDER — DIPHENHYDRAMINE HCL 25 MG PO CAPS
50.0000 mg | ORAL_CAPSULE | Freq: Once | ORAL | Status: AC
  Administered 2022-11-15: 50 mg via ORAL
  Filled 2022-11-15: qty 2

## 2022-11-15 MED ORDER — SODIUM CHLORIDE 0.9 % IV SOLN
400.0000 mg/m2 | Freq: Once | INTRAVENOUS | Status: AC
  Administered 2022-11-15: 800 mg via INTRAVENOUS
  Filled 2022-11-15: qty 40

## 2022-11-15 MED ORDER — SODIUM CHLORIDE 0.9 % IV SOLN
10.0000 mg | Freq: Once | INTRAVENOUS | Status: AC
  Administered 2022-11-15: 10 mg via INTRAVENOUS
  Filled 2022-11-15: qty 10

## 2022-11-15 MED ORDER — PALONOSETRON HCL INJECTION 0.25 MG/5ML
0.2500 mg | Freq: Once | INTRAVENOUS | Status: AC
  Administered 2022-11-15: 0.25 mg via INTRAVENOUS
  Filled 2022-11-15: qty 5

## 2022-11-15 MED ORDER — ACETAMINOPHEN 325 MG PO TABS
650.0000 mg | ORAL_TABLET | Freq: Once | ORAL | Status: AC
  Administered 2022-11-15: 650 mg via ORAL
  Filled 2022-11-15: qty 2

## 2022-11-15 MED ORDER — HEPARIN SOD (PORK) LOCK FLUSH 100 UNIT/ML IV SOLN
500.0000 [IU] | Freq: Once | INTRAVENOUS | Status: AC | PRN
  Administered 2022-11-15: 500 [IU]

## 2022-11-15 MED ORDER — SODIUM CHLORIDE 0.9 % IV SOLN
150.0000 mg | Freq: Once | INTRAVENOUS | Status: AC
  Administered 2022-11-15: 150 mg via INTRAVENOUS
  Filled 2022-11-15: qty 150

## 2022-11-15 MED ORDER — DOXORUBICIN HCL CHEMO IV INJECTION 2 MG/ML
25.0000 mg/m2 | Freq: Once | INTRAVENOUS | Status: AC
  Administered 2022-11-15: 50 mg via INTRAVENOUS
  Filled 2022-11-15: qty 25

## 2022-11-15 NOTE — Progress Notes (Signed)
HEMATOLOGY ONCOLOGY CLINIC NOTE  Date of service: 11/15/22    Patient Care Team: Nechama Guard, FNP as PCP - General (Family Medicine) O'Neal, Ronnald Ramp, MD as PCP - Cardiology (Cardiology) Johney Maine, MD as Consulting Physician (Hematology)  CC f/u for large B cell lymphoma  SUMMARY OF ONCOLOGIC HISTORY: Oncology History  Diffuse large B-cell lymphoma of lymph nodes of multiple regions [facility])  07/25/2022 Initial Diagnosis   Diffuse large B-cell lymphoma of lymph nodes of multiple regions (HCC)   08/06/2022 -  Chemotherapy   Patient is on Treatment Plan : NON-HODGKINS LYMPHOMA R-CHOP q21d       HISTORY OF PRESENTING ILLNESS: Tyler [name] is a 72 y.o. male with a past medical history of prostate cancer status post prostatectomy in 2021, history of bilateral pulmonary embolism after his surgery in 2021 presenting today for follow up and management of MDS.  He originally presented to the ED on 11/28/21 for low back pain with radiation down his leg. He was diagnosed with sciatica at urgent care prior to the ED visit. He had a negative xray and CT. He returned to the ED after developing a fever and hematuria. His urologist had stopped his Eliquis 3 months earlier. While in the ED on 12/10/21 he was noted to be febrile with blood work showing HGB of 6.2 and a UTI. His stool was heme-negative. He was hospitalized for treatment of his UTI with secondary sepsis.  Today, he states that he has been doing okay since our last visit. He feels that he is getting stronger.   However, he continues to have low back pain which is worsened with bending over and lying down on his back. His pain radiates into his lateral right leg. He has been walking for half an hour or so but notes that his pain worsens with walking in the cold. He has not been working since I last saw him. His back pain has improved somewhat since he stopped working. He is scheduled to see his PCP next week and plans to  discuss referral for orthospine at that time.  He notes some dizziness when he takes some of his medications.  Since his last visit he has completely stopped drinking alcohol and use of Ibuprofen. His appetite has improved. His HGB has continued to improve to 10.1. His platelets have also improved to 74,000. His WBC counts continue to remain WNL. CMP also improved.  He reports having some enlarged lymph nodes in the left anterior cervical region x1 month. He denies any dental problems. He denies any other lumps or bumps.  He reports a recent episode of chills. He denies any recent vaccines. He denies signs of infection such as sore throat, sinus drainage, cough, or urinary symptoms. He denies fevers. He denies nausea, vomiting, chest pain, dyspnea or cough. He denies any bowel changes. His weight has decreased 6 pounds over last 1.5 months .   INTERVAL HISTORY:  Tyler [name] is a 72 y.o. male here for continued evaluation and management of his low grade MDS.   Patient was last seen by me in clinic on 10/04/2022 and complained of back pain radiating to his right thigh with numbness, but was otherwise doing well overall with no new medical concerns.   He was seen by Mariane Baumgarten, PA on 10/23/2022 and reported stable trace blood in the urine, and was otherwise doing well overall.   Today, he is here for toxicity check for cycle 5, day 1 of R-CHOP. Patient is  accompanied by his daughter during this visit.   He reports bilateral lumps in his axillaries. Upon physical examination, this appears to be extra fat/skin in the area and I am not feeling any lymph nodes on palpation.   Patient has normal p.o. intake and he reports that his weight is retuning to baseline at this time. Patient denies any SOB, abdominal pain, leg swelling, or back pain.   He sometimes experiences discomfort in his right ankle, but denies any swelling in the area.   Patient notes some Itchiness in the area of his port-a-cath.   He  complains of fatigue sometimes. He denies any infection issues, nausea, vomiting, diarrhea, constipation, or mouth sores.   Patient complains of increased sensitivity in his tooth. He notes that his sensitivity was triggered by ice. He uses Sensodyne toothpaste to manage symptoms  REVIEW OF SYSTEMS:    10 Point review of Systems was done is negative except as noted above.   Past Medical History:  Diagnosis Date   Cancer [facility])    prostate   Follicular lymphoma (HCC) 2024   History of pulmonary embolism    Hypertension    Myelodysplastic syndrome (HCC)    PAF (paroxysmal atrial fibrillation) (HCC)    Recurrent UTI    Past Surgical History:  Procedure Laterality Date   BIOPSY  08/21/2022   Procedure: BIOPSY;  Surgeon: Beverley Fiedler, MD;  Location: [facility] ENDOSCOPY;  Service: Gastroenterology;;   ESOPHAGOGASTRODUODENOSCOPY (EGD) WITH PROPOFOL N/A 08/21/2022   Procedure: ESOPHAGOGASTRODUODENOSCOPY (EGD) WITH PROPOFOL;  Surgeon: Beverley Fiedler, MD;  Location: [facility] ENDOSCOPY;  Service: Gastroenterology;  Laterality: N/A;   IR IMAGING GUIDED PORT INSERTION  08/26/2022   LYMPHADENECTOMY Bilateral 09/01/2019   Procedure: LYMPHADENECTOMY;  Surgeon: Sebastian Ache, MD;  Location: [facility] ORS;  Service: Urology;  Laterality: Bilateral;   ROBOT ASSISTED LAPAROSCOPIC RADICAL PROSTATECTOMY N/A 09/01/2019   Procedure: XI ROBOTIC ASSISTED LAPAROSCOPIC RADICAL PROSTATECTOMY;  Surgeon: Sebastian Ache, MD;  Location: [facility] ORS;  Service: Urology;  Laterality: N/A;  3 HRS   Social History   Tobacco Use   Smoking status: Never   Smokeless tobacco: Never  Vaping Use   Vaping status: Never Used  Substance Use Topics   Alcohol use: Yes    Comment: at least one 40 oz beer daily   Drug use: Never    ALLERGIES:  is allergic to amitriptyline, amlodipine, and neurontin [gabapentin].  MEDICATIONS:  Current Outpatient Medications  Medication Sig Dispense Refill   allopurinol (ZYLOPRIM) 100 MG tablet TAKE 1 TABLET BY  MOUTH TWICE A DAY 180 tablet 1   B Complex Vitamins (VITAMIN B COMPLEX) TABS Take 1 tablet by mouth daily. 30 tablet 0   cholecalciferol (VITAMIN D3) 25 MCG (1000 UNIT) tablet Take 2 tablets (2,000 Units total) by mouth daily.     clotrimazole-betamethasone (LOTRISONE) cream APPLY TO AFFECTED AREA TWICE A DAY 45 g 0   diclofenac Sodium (VOLTAREN) 1 % GEL APPLY 4 G TOPICALLY 4 (FOUR) TIMES DAILY. APPLY TO RIGHT LATERAL HIP AND THIGH DOWN TO KNEE (Patient taking differently: Apply 4 g topically daily as needed (For pain).) 300 g 1   folic acid (FOLVITE) 1 MG tablet Take 1 mg by mouth daily.     furosemide (LASIX) 20 MG tablet TAKE 1 TABLET BY MOUTH EVERY DAY 90 tablet 1   furosemide (LASIX) 40 MG tablet Take 40 mg by mouth daily.     KLOR-CON M20 20 MEQ tablet TAKE 1 TABLET BY MOUTH TWICE A DAY  180 tablet 1   lidocaine-prilocaine (EMLA) cream Apply to affected area once 30 g 3   Menthol-Methyl Salicylate (MUSCLE RUB) 10-15 % CREA Apply 1 Application topically daily as needed for muscle pain.     metoprolol succinate (TOPROL XL) 25 MG 24 hr tablet Take 1 tablet (25 mg total) by mouth daily. 90 tablet 3   Multiple Vitamin (MULTIVITAMIN WITH MINERALS) TABS tablet Take 1 tablet by mouth daily.     ondansetron (ZOFRAN) 8 MG tablet Take 1 tablet (8 mg total) by mouth every 8 (eight) hours as needed for nausea or vomiting. Start on the third day after cyclophosphamide chemotherapy. 30 tablet 1   oxyCODONE (OXY IR/ROXICODONE) 5 MG immediate release tablet Take 1 tablet (5 mg total) by mouth every 6 (six) hours as needed for severe pain. 15 tablet 0   pantoprazole (PROTONIX) 40 MG tablet Take 1 tablet (40 mg total) by mouth 2 (two) times daily. For 12 weeks  \   predniSONE (DELTASONE) 50 MG tablet TAKE 1 TABLET BY MOUTH EVERY DAY WITH BREAKFAST FOR 5 DAYS STARTING THE DAY AFTER EACH CHEMOTHERAPY 25 tablet 0   Tenofovir Alafenamide Fumarate (VEMLIDY) 25 MG TABS Take 1 tablet (25 mg total) by mouth daily. 30  tablet 5   No current facility-administered medications for this visit.    PHYSICAL EXAMINATION: ECOG PERFORMANCE STATUS: 1 - Symptomatic but completely ambulatory .BP (!) 150/85   Pulse 62   Temp (!) 97.3 F (36.3 C)   Resp 20   Wt 175 lb 12.8 oz (79.7 kg)   SpO2 100%   BMI 27.53 kg/m   GENERAL:alert, in no acute distress and comfortable SKIN: no acute rashes, no significant lesions EYES: conjunctiva are pink and non-injected, sclera anicteric OROPHARYNX: MMM, no exudates, no oropharyngeal erythema or ulceration NECK: supple, no JVD LYMPH:  no palpable lymphadenopathy in the cervical, axillary or inguinal regions LUNGS: clear to auscultation b/l with normal respiratory effort HEART: regular rate & rhythm ABDOMEN:  normoactive bowel sounds , non tender, not distended. Extremity: no pedal edema PSYCH: alert & oriented x 3 with fluent speech NEURO: no focal motor/sensory deficits   LABORATORY DATA:   I have reviewed the data as listed .    Latest Ref Rng & Units 11/15/2022    8:30 AM 10/23/2022    8:08 AM 10/04/2022    9:31 AM  CBC  WBC 4.0 - 10.5 K/uL 5.1  5.2  7.0   Hemoglobin 13.0 - 17.0 g/dL 78.2  95.6  21.3   Hematocrit 39.0 - 52.0 % 37.7  37.6  33.2   Platelets 150 - 400 K/uL 197  219  163    .    Latest Ref Rng & Units 11/15/2022    8:30 AM 10/23/2022    8:08 AM 10/04/2022    9:31 AM  CMP  Glucose 70 - 99 mg/dL 086  578  469   BUN 8 - 23 mg/dL 12  8  7    Creatinine 0.61 - 1.24 mg/dL 6.29  5.28  4.13   Sodium 135 - 145 mmol/L 141  143  141   Potassium 3.5 - 5.1 mmol/L 3.4  3.5  3.5   Chloride 98 - 111 mmol/L 107  107  108   CO2 22 - 32 mmol/L 28  28  26    Calcium 8.9 - 10.3 mg/dL 9.0  9.2  8.7   Total Protein 6.5 - 8.1 g/dL 6.4  6.5  6.2   Total  Bilirubin 0.3 - 1.2 mg/dL 0.3  0.4  0.5   Alkaline Phos 38 - 126 U/L 92  138  131   AST 15 - 41 U/L 26  22  19    ALT 0 - 44 U/L 31  23  17     Lymph node biopsy 07/16/2022    RADIOGRAPHIC STUDIES: I have  personally reviewed the radiological images as listed and agreed with the findings in the report. NM PET Image Restag (PS) Skull Base To Thigh  Result Date: 10/22/2022 CLINICAL DATA:  Subsequent treatment strategy for large B-cell lymphoma. Status post 3 cycles of chemo immunotherapy. EXAM: NUCLEAR MEDICINE PET SKULL BASE TO THIGH TECHNIQUE: 8.5 mCi F-18 FDG was injected intravenously. Full-ring PET imaging was performed from the skull base to thigh after the radiotracer. CT data was obtained and used for attenuation correction and anatomic localization. Fasting blood glucose: 82 mg/dl COMPARISON:  CT chest dated 08/03/2022. CT abdomen/pelvis dated 07/29/2022. FINDINGS: Mediastinal blood pool activity: SUV max 2.5 Liver activity: SUV max 3.9 NECK: Focal hypermetabolism along the deep lobe of the left parotid gland, without convincing nodule/node on CT. Max SUV 7.1. This is considered indeterminate. Incidental CT findings: None. CHEST: Small bilateral axillary nodes, including 11 mm short axis right axillary node (series 4/image 7) with max SUV 3.1 and a 10 mm short axis left axillary node (series 4/image 85) with max SUV 3.4. No hypermetabolic mediastinal lymphadenopathy. No suspicious pulmonary nodules. Right chest port terminates in the upper right atrium. Incidental CT findings: Mild coronary atherosclerosis of the LAD. ABDOMEN/PELVIS: No abnormal hypermetabolism in the liver, pancreas, or adrenal glands. Spleen is normal in size without focal lesion. Reference max SUV 2.7. No hypermetabolic abdominopelvic lymphadenopathy. Incidental CT findings: Bilateral renal cysts measuring up to 3.6 cm (series 4/image 21), measuring simple fluid density, benign (Bosniak I). No follow-up is recommended. SKELETON: No focal hypermetabolic activity to suggest skeletal metastasis/osseous lymphoma. Incidental CT findings: Mild degenerative changes of the visualized thoracolumbar spine. IMPRESSION: Near complete metabolic  response. Small bilateral axillary nodes, as above.  Deauville criteria 3. Focal hypermetabolism along the deep lobe of the left parotid gland, without convincing nodule/node on CT, indeterminate. Attention on follow-up is suggested. Otherwise, no findings suspicious for active lymphoma. Electronically Signed   By: Charline Bills M.D.   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 Typically would have done daEPOCH-R but with his r/o MDS high risk of cytopenias esp with etoposide Status post mini CHOP on 08/06/2022.  Expect nadir in  blood counts 10 to 12 days out. Rituxan was held due to significant tumor lysis and multifactorial AKI.  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  # 7 Anemia and mild thrombocytopenia -- likely multifactorial -- lymphoma + MDS+ >?sepsis r/o GI bleeding.  And now due to his mini CHOP chemotherapy.  #8 Abdominal distention with partial obstruction versus ileus.  # 9 Hep B c AB +ve, Hep B SAg neg HepB SAb neg, HBV DNA PCR --neg.  #10 acute kidney injury  with sodium level less than 20 possibly related to prerenal causes versus contrast nephropathy versus high risk of tumor lysis syndrome. Renal ultrasound with no obstruction Received 1 dose of rasburicase on 6/12 and 1 dose of rasburicase 6 mg on 6/13. Currently on CRRT.  11 neutropenia from chemotherapy-continues to be on G-CSF  PLAN:  -Discussed lab results on 11/15/22 in detail with patient. CBC showed WBC of 5.1K, hemoglobin of 12.8, and platelets of 197K. -Mild  neutropenia at 1.3K -CMP shows potassium level is slightly low at 3.4. Recommend potassium-rich foods such as bananas and coconut water. Okay to stop potassium tablets -patient has no new or severe toxicities from his last cycle of treatment -proceed with next cycle of treatment. Will then plan for PET scan. If PET scan is normal, will plan to monitor with labs every 3 months -recommend continuing desensitizing toothpaste, Sensodyne  -recommend patient to be seen by a dentist if teeth sensitivity is significantly bothersome -educated patient that chemotherapy may cause gum sensitivity and generally does not cause tooth sensitivity -educated patient of anti-inflammatory effects of chemotherapy which may be improving back pain -discussed option of being seen by orthopedics if his back pain becomes bothersome after treatment is finished -continue to stay physically active regularly -stop allopurinol as there is a low chance of tumor lysis at this time -continue vitamin B complex -continue 2000 units vitamin D -stop lasix -continue acid suppressent once daily  -Continue prednisone -continue OTC gel for pain management  FOLLOW-UP: ***  The total time spent in the appointment was *** minutes* .  All of the patient's questions were answered with apparent satisfaction. The patient knows to call the clinic with any problems, questions or concerns.   Wyvonnia Lora MD MS AAHIVMS [facility] Hematology/Oncology Physician [facility]  .*Total Encounter Time as defined by the Centers for Medicare and Medicaid Services includes, in addition to the face-to-face time of a patient visit (documented in the note above) non-face-to-face time: obtaining and reviewing outside history, ordering and reviewing medications, tests or procedures, care coordination (communications with other health care professionals or caregivers) and documentation in the medical record.    I,Mitra Faeizi,acting as a  Neurosurgeon for Wyvonnia Lora, MD.,have documented all relevant documentation on the behalf of Wyvonnia Lora, MD,as directed by  Wyvonnia Lora, MD while in the presence of Wyvonnia Lora, MD.  ***

## 2022-11-15 NOTE — Patient Instructions (Addendum)
  [facility]  Discharge Instructions: Thank you for choosing [facility] to provide your oncology and hematology care.   If you have a lab appointment with the Cancer Center, please go directly to the Cancer Center and check in at the registration area.   Wear comfortable clothing and clothing appropriate for easy access to any Portacath or PICC line.   We strive to give you quality time with your provider. You may need to reschedule your appointment if you arrive late (15 or more minutes).  Arriving late affects you and other patients whose appointments are after yours.  Also, if you miss three or more appointments without notifying the office, you may be dismissed from the clinic at the provider's discretion.      For prescription refill requests, have your pharmacy contact our office and allow 72 hours for refills to be completed.    Today you received the following chemotherapy and/or immunotherapy agents Adriamycin / Vincristine / Cytoxan / Rituximab      To help prevent nausea and vomiting after your treatment, we encourage you to take your nausea medication as directed.  BELOW ARE SYMPTOMS THAT SHOULD BE REPORTED IMMEDIATELY: *FEVER GREATER THAN 100.4 F (38 C) OR HIGHER *CHILLS OR SWEATING *NAUSEA AND VOMITING THAT IS NOT CONTROLLED WITH YOUR NAUSEA MEDICATION *UNUSUAL SHORTNESS OF BREATH *UNUSUAL BRUISING OR BLEEDING *URINARY PROBLEMS (pain or burning when urinating, or frequent urination) *BOWEL PROBLEMS (unusual diarrhea, constipation, pain near the anus) TENDERNESS IN MOUTH AND THROAT WITH OR WITHOUT PRESENCE OF ULCERS (sore throat, sores in mouth, or a toothache) UNUSUAL RASH, SWELLING OR PAIN  UNUSUAL VAGINAL DISCHARGE OR ITCHING   Items with * indicate a potential emergency and should be followed up as soon as possible or go to the Emergency Department if any problems should occur.  Please show the CHEMOTHERAPY ALERT  CARD or IMMUNOTHERAPY ALERT CARD at check-in to the Emergency Department and triage nurse.  Should you have questions after your visit or need to cancel or reschedule your appointment, please contact [facility]  Dept: [phone]  and follow the prompts.  Office hours are 8:00 a.m. to 4:30 p.m. Monday - Friday. Please note that voicemails left after 4:00 p.m. may not be returned until the following business day.  We are closed weekends and major holidays. You have access to a nurse at all times for urgent questions. Please call the main number to the clinic Dept: [phone] and follow the prompts.   For any non-urgent questions, you may also contact your provider using MyChart. We now offer e-Visits for anyone 64 and older to request care online for non-urgent symptoms. For details visit [email or web address].   Also download the MyChart app! Go to the app store, search "MyChart", open the app, select [facility], and log in with your MyChart username and password.

## 2022-11-15 NOTE — Progress Notes (Signed)
Patient seen by Dr. Addison Naegeli are within treatment parameters.  Labs reviewed: and are not all within treatment parameters. Dr Candise Che aware   ANC 1.3, K 3.4  Per physician team, patient is ready for treatment and there are NO modifications to the treatment plan.

## 2022-11-16 ENCOUNTER — Other Ambulatory Visit: Payer: Self-pay | Admitting: Hematology

## 2022-11-18 ENCOUNTER — Inpatient Hospital Stay: Payer: 59

## 2022-11-18 DIAGNOSIS — Z8546 Personal history of malignant neoplasm of prostate: Secondary | ICD-10-CM | POA: Diagnosis not present

## 2022-11-18 DIAGNOSIS — Z5189 Encounter for other specified aftercare: Secondary | ICD-10-CM | POA: Diagnosis not present

## 2022-11-18 DIAGNOSIS — Z791 Long term (current) use of non-steroidal anti-inflammatories (NSAID): Secondary | ICD-10-CM | POA: Diagnosis not present

## 2022-11-18 DIAGNOSIS — I480 Paroxysmal atrial fibrillation: Secondary | ICD-10-CM | POA: Diagnosis not present

## 2022-11-18 DIAGNOSIS — D649 Anemia, unspecified: Secondary | ICD-10-CM | POA: Diagnosis not present

## 2022-11-18 DIAGNOSIS — Z79624 Long term (current) use of inhibitors of nucleotide synthesis: Secondary | ICD-10-CM | POA: Diagnosis not present

## 2022-11-18 DIAGNOSIS — C8338 Diffuse large B-cell lymphoma, lymph nodes of multiple sites: Secondary | ICD-10-CM | POA: Diagnosis not present

## 2022-11-18 DIAGNOSIS — Z86711 Personal history of pulmonary embolism: Secondary | ICD-10-CM | POA: Diagnosis not present

## 2022-11-18 DIAGNOSIS — Z5112 Encounter for antineoplastic immunotherapy: Secondary | ICD-10-CM | POA: Diagnosis not present

## 2022-11-18 DIAGNOSIS — Z7952 Long term (current) use of systemic steroids: Secondary | ICD-10-CM | POA: Diagnosis not present

## 2022-11-18 DIAGNOSIS — Z79899 Other long term (current) drug therapy: Secondary | ICD-10-CM | POA: Diagnosis not present

## 2022-11-18 DIAGNOSIS — D696 Thrombocytopenia, unspecified: Secondary | ICD-10-CM | POA: Diagnosis not present

## 2022-11-18 DIAGNOSIS — Z5111 Encounter for antineoplastic chemotherapy: Secondary | ICD-10-CM | POA: Diagnosis not present

## 2022-11-18 DIAGNOSIS — D701 Agranulocytosis secondary to cancer chemotherapy: Secondary | ICD-10-CM | POA: Diagnosis not present

## 2022-11-18 DIAGNOSIS — T451X5A Adverse effect of antineoplastic and immunosuppressive drugs, initial encounter: Secondary | ICD-10-CM | POA: Diagnosis not present

## 2022-11-18 MED ORDER — PEGFILGRASTIM-JMDB 6 MG/0.6ML ~~LOC~~ SOSY
6.0000 mg | PREFILLED_SYRINGE | Freq: Once | SUBCUTANEOUS | Status: AC
  Administered 2022-11-18: 6 mg via SUBCUTANEOUS
  Filled 2022-11-18: qty 0.6

## 2022-11-19 ENCOUNTER — Encounter: Payer: Self-pay | Admitting: Hematology

## 2022-12-04 MED FILL — Fosaprepitant Dimeglumine For IV Infusion 150 MG (Base Eq): INTRAVENOUS | Qty: 5 | Status: AC

## 2022-12-04 MED FILL — Dexamethasone Sodium Phosphate Inj 100 MG/10ML: INTRAMUSCULAR | Qty: 1 | Status: AC

## 2022-12-05 ENCOUNTER — Inpatient Hospital Stay: Payer: 59

## 2022-12-05 ENCOUNTER — Inpatient Hospital Stay: Payer: 59 | Attending: Hematology

## 2022-12-05 ENCOUNTER — Inpatient Hospital Stay (HOSPITAL_BASED_OUTPATIENT_CLINIC_OR_DEPARTMENT_OTHER): Payer: 59 | Admitting: Hematology

## 2022-12-05 VITALS — BP 143/90 | HR 69 | Temp 98.0°F | Resp 16 | Wt 176.0 lb

## 2022-12-05 DIAGNOSIS — Z8546 Personal history of malignant neoplasm of prostate: Secondary | ICD-10-CM | POA: Insufficient documentation

## 2022-12-05 DIAGNOSIS — Z5111 Encounter for antineoplastic chemotherapy: Secondary | ICD-10-CM

## 2022-12-05 DIAGNOSIS — C8338 Diffuse large B-cell lymphoma, lymph nodes of multiple sites: Secondary | ICD-10-CM

## 2022-12-05 DIAGNOSIS — Z7952 Long term (current) use of systemic steroids: Secondary | ICD-10-CM | POA: Diagnosis not present

## 2022-12-05 DIAGNOSIS — I480 Paroxysmal atrial fibrillation: Secondary | ICD-10-CM | POA: Insufficient documentation

## 2022-12-05 DIAGNOSIS — D701 Agranulocytosis secondary to cancer chemotherapy: Secondary | ICD-10-CM | POA: Diagnosis not present

## 2022-12-05 DIAGNOSIS — R350 Frequency of micturition: Secondary | ICD-10-CM

## 2022-12-05 DIAGNOSIS — Z86711 Personal history of pulmonary embolism: Secondary | ICD-10-CM | POA: Insufficient documentation

## 2022-12-05 DIAGNOSIS — Z79624 Long term (current) use of inhibitors of nucleotide synthesis: Secondary | ICD-10-CM | POA: Diagnosis not present

## 2022-12-05 DIAGNOSIS — Z5112 Encounter for antineoplastic immunotherapy: Secondary | ICD-10-CM | POA: Insufficient documentation

## 2022-12-05 DIAGNOSIS — Z79899 Other long term (current) drug therapy: Secondary | ICD-10-CM | POA: Diagnosis not present

## 2022-12-05 DIAGNOSIS — Z5189 Encounter for other specified aftercare: Secondary | ICD-10-CM | POA: Diagnosis not present

## 2022-12-05 DIAGNOSIS — T451X5A Adverse effect of antineoplastic and immunosuppressive drugs, initial encounter: Secondary | ICD-10-CM | POA: Insufficient documentation

## 2022-12-05 LAB — CMP (CANCER CENTER ONLY)
ALT: 34 U/L (ref 0–44)
AST: 29 U/L (ref 15–41)
Albumin: 4.1 g/dL (ref 3.5–5.0)
Alkaline Phosphatase: 93 U/L (ref 38–126)
Anion gap: 7 (ref 5–15)
BUN: 11 mg/dL (ref 8–23)
CO2: 28 mmol/L (ref 22–32)
Calcium: 9.3 mg/dL (ref 8.9–10.3)
Chloride: 106 mmol/L (ref 98–111)
Creatinine: 0.88 mg/dL (ref 0.61–1.24)
GFR, Estimated: >60 mL/min (ref >60)
Glucose, Bld: 112 mg/dL — ABNORMAL HIGH (ref 70–99)
Potassium: 3.4 mmol/L — ABNORMAL LOW (ref 3.5–5.1)
Sodium: 141 mmol/L (ref 135–145)
Total Bilirubin: 0.3 mg/dL (ref 0.3–1.2)
Total Protein: 6.6 g/dL (ref 6.5–8.1)

## 2022-12-05 LAB — CBC WITH DIFFERENTIAL (CANCER CENTER ONLY)
Abs Immature Granulocytes: 0.01 10*3/uL (ref 0.00–0.07)
Basophils Absolute: 0 10*3/uL (ref 0.0–0.1)
Basophils Relative: 1 %
Eosinophils Absolute: 0 10*3/uL (ref 0.0–0.5)
Eosinophils Relative: 0 %
HCT: 41.2 % (ref 39.0–52.0)
Hemoglobin: 13.5 g/dL (ref 13.0–17.0)
Immature Granulocytes: 0 %
Lymphocytes Relative: 36 %
Lymphs Abs: 1.6 10*3/uL (ref 0.7–4.0)
MCH: 29.7 pg (ref 26.0–34.0)
MCHC: 32.8 g/dL (ref 30.0–36.0)
MCV: 90.7 fL (ref 80.0–100.0)
Monocytes Absolute: 1.1 10*3/uL — ABNORMAL HIGH (ref 0.1–1.0)
Monocytes Relative: 24 %
Neutro Abs: 1.7 10*3/uL (ref 1.7–7.7)
Neutrophils Relative %: 39 %
Platelet Count: 222 10*3/uL (ref 150–400)
RBC: 4.54 MIL/uL (ref 4.22–5.81)
RDW: 14.4 % (ref 11.5–15.5)
WBC Count: 4.4 10*3/uL (ref 4.0–10.5)
nRBC: 0 % (ref 0.0–0.2)

## 2022-12-05 MED ORDER — ACETAMINOPHEN 325 MG PO TABS
650.0000 mg | ORAL_TABLET | Freq: Once | ORAL | Status: AC
  Administered 2022-12-05: 650 mg via ORAL
  Filled 2022-12-05: qty 2

## 2022-12-05 MED ORDER — PALONOSETRON HCL INJECTION 0.25 MG/5ML
0.2500 mg | Freq: Once | INTRAVENOUS | Status: AC
  Administered 2022-12-05: 0.25 mg via INTRAVENOUS
  Filled 2022-12-05: qty 5

## 2022-12-05 MED ORDER — VINCRISTINE SULFATE CHEMO INJECTION 1 MG/ML
1.0000 mg | Freq: Once | INTRAVENOUS | Status: AC
  Administered 2022-12-05: 1 mg via INTRAVENOUS
  Filled 2022-12-05: qty 1

## 2022-12-05 MED ORDER — SODIUM CHLORIDE 0.9 % IV SOLN
150.0000 mg | Freq: Once | INTRAVENOUS | Status: AC
  Administered 2022-12-05: 150 mg via INTRAVENOUS
  Filled 2022-12-05: qty 150

## 2022-12-05 MED ORDER — DIPHENHYDRAMINE HCL 25 MG PO CAPS
50.0000 mg | ORAL_CAPSULE | Freq: Once | ORAL | Status: AC
  Administered 2022-12-05: 50 mg via ORAL
  Filled 2022-12-05: qty 2

## 2022-12-05 MED ORDER — SODIUM CHLORIDE 0.9 % IV SOLN
375.0000 mg/m2 | Freq: Once | INTRAVENOUS | Status: AC
  Administered 2022-12-05: 800 mg via INTRAVENOUS
  Filled 2022-12-05: qty 50

## 2022-12-05 MED ORDER — DOXORUBICIN HCL CHEMO IV INJECTION 2 MG/ML
25.0000 mg/m2 | Freq: Once | INTRAVENOUS | Status: AC
  Administered 2022-12-05: 50 mg via INTRAVENOUS
  Filled 2022-12-05: qty 25

## 2022-12-05 MED ORDER — SODIUM CHLORIDE 0.9 % IV SOLN
10.0000 mg | Freq: Once | INTRAVENOUS | Status: AC
  Administered 2022-12-05: 10 mg via INTRAVENOUS
  Filled 2022-12-05: qty 10

## 2022-12-05 MED ORDER — SODIUM CHLORIDE 0.9% FLUSH
10.0000 mL | INTRAVENOUS | Status: DC | PRN
  Administered 2022-12-05: 10 mL

## 2022-12-05 MED ORDER — SODIUM CHLORIDE 0.9 % IV SOLN: Freq: Once | INTRAVENOUS | Status: AC

## 2022-12-05 MED ORDER — SODIUM CHLORIDE 0.9 % IV SOLN
400.0000 mg/m2 | Freq: Once | INTRAVENOUS | Status: AC
  Administered 2022-12-05: 800 mg via INTRAVENOUS
  Filled 2022-12-05: qty 40

## 2022-12-05 MED ORDER — HEPARIN SOD (PORK) LOCK FLUSH 100 UNIT/ML IV SOLN
500.0000 [IU] | Freq: Once | INTRAVENOUS | Status: AC | PRN
  Administered 2022-12-05: 500 [IU]

## 2022-12-05 NOTE — Progress Notes (Signed)
HEMATOLOGY ONCOLOGY CLINIC NOTE  Date of service: 12/05/22    Patient Care Team: Nechama Guard, FNP as PCP - General (Family Medicine) O'Neal, Ronnald Ramp, MD as PCP - Cardiology (Cardiology) Johney Maine, MD as Consulting Physician (Hematology)  CC f/u for large B cell lymphoma  SUMMARY OF ONCOLOGIC HISTORY: Oncology History  Diffuse large B-cell lymphoma of lymph nodes of multiple regions [facility])  07/25/2022 Initial Diagnosis   Diffuse large B-cell lymphoma of lymph nodes of multiple regions (HCC)   08/06/2022 -  Chemotherapy   Patient is on Treatment Plan : NON-HODGKINS LYMPHOMA R-CHOP q21d       HISTORY OF PRESENTING ILLNESS: Tyler [name] is a 72 y.o. male with a past medical history of prostate cancer status post prostatectomy in 2021, history of bilateral pulmonary embolism after his surgery in 2021 presenting today for follow up and management of MDS.  He originally presented to the ED on 11/28/21 for low back pain with radiation down his leg. He was diagnosed with sciatica at urgent care prior to the ED visit. He had a negative xray and CT. He returned to the ED after developing a fever and hematuria. His urologist had stopped his Eliquis 3 months earlier. While in the ED on 12/10/21 he was noted to be febrile with blood work showing HGB of 6.2 and a UTI. His stool was heme-negative. He was hospitalized for treatment of his UTI with secondary sepsis.  Today, he states that he has been doing okay since our last visit. He feels that he is getting stronger.   However, he continues to have low back pain which is worsened with bending over and lying down on his back. His pain radiates into his lateral right leg. He has been walking for half an hour or so but notes that his pain worsens with walking in the cold. He has not been working since I last saw him. His back pain has improved somewhat since he stopped working. He is scheduled to see his PCP next week and plans to  discuss referral for orthospine at that time.  He notes some dizziness when he takes some of his medications.  Since his last visit he has completely stopped drinking alcohol and use of Ibuprofen. His appetite has improved. His HGB has continued to improve to 10.1. His platelets have also improved to 74,000. His WBC counts continue to remain WNL. CMP also improved.  He reports having some enlarged lymph nodes in the left anterior cervical region x1 month. He denies any dental problems. He denies any other lumps or bumps.  He reports a recent episode of chills. He denies any recent vaccines. He denies signs of infection such as sore throat, sinus drainage, cough, or urinary symptoms. He denies fevers. He denies nausea, vomiting, chest pain, dyspnea or cough. He denies any bowel changes. His weight has decreased 6 pounds over last 1.5 months .   INTERVAL HISTORY:  Tyler [name] is a 72 y.o. male here for continued evaluation and management of his low grade MDS. He is here fro cycle 6 day 1 of his treatment.   Patient was last seen by me on 11/15/2022 and he complained of bilateral axillary lumps but no lymph nodes on palpation, mild discomfort in his right ankle, occasional fatigue, and increased sensitivity in his tooth.   Patient is accompanied by a interpreter during this visit. Patient notes he has been doing well overall since our last visit. He does complain of increased tooth  sensitivity with cold food and notes he has cavities.   Patient notes he tolerated his cycle 5 of his treatment well with mild toxicity of frequent urination. He does note of enlarged prostate, but denied any medication for it.   He denies taking oxycodone or lasix anymore.   He denies any new infection issues, fever, chills, night sweats, abdominal pain, chest pain, back pain, abnormal bowel movement, or leg swelling.     REVIEW OF SYSTEMS:    10 Point review of Systems was done is negative except as noted  above.   Past Medical History:  Diagnosis Date   Cancer [facility])    prostate   Follicular lymphoma (HCC) 2024   History of pulmonary embolism    Hypertension    Myelodysplastic syndrome (HCC)    PAF (paroxysmal atrial fibrillation) (HCC)    Recurrent UTI    Past Surgical History:  Procedure Laterality Date   BIOPSY  08/21/2022   Procedure: BIOPSY;  Surgeon: Beverley Fiedler, MD;  Location: [facility] ENDOSCOPY;  Service: Gastroenterology;;   ESOPHAGOGASTRODUODENOSCOPY (EGD) WITH PROPOFOL N/A 08/21/2022   Procedure: ESOPHAGOGASTRODUODENOSCOPY (EGD) WITH PROPOFOL;  Surgeon: Beverley Fiedler, MD;  Location: [facility] ENDOSCOPY;  Service: Gastroenterology;  Laterality: N/A;   IR IMAGING GUIDED PORT INSERTION  08/26/2022   LYMPHADENECTOMY Bilateral 09/01/2019   Procedure: LYMPHADENECTOMY;  Surgeon: Sebastian Ache, MD;  Location: [facility] ORS;  Service: Urology;  Laterality: Bilateral;   ROBOT ASSISTED LAPAROSCOPIC RADICAL PROSTATECTOMY N/A 09/01/2019   Procedure: XI ROBOTIC ASSISTED LAPAROSCOPIC RADICAL PROSTATECTOMY;  Surgeon: Sebastian Ache, MD;  Location: [facility] ORS;  Service: Urology;  Laterality: N/A;  3 HRS   Social History   Tobacco Use   Smoking status: Never   Smokeless tobacco: Never  Vaping Use   Vaping status: Never Used  Substance Use Topics   Alcohol use: Yes    Comment: at least one 40 oz beer daily   Drug use: Never    ALLERGIES:  is allergic to amitriptyline, amlodipine, and neurontin [gabapentin].  MEDICATIONS:  Current Outpatient Medications  Medication Sig Dispense Refill   allopurinol (ZYLOPRIM) 100 MG tablet TAKE 1 TABLET BY MOUTH TWICE A DAY 180 tablet 1   B Complex Vitamins (VITAMIN B COMPLEX) TABS Take 1 tablet by mouth daily. 30 tablet 0   cholecalciferol (VITAMIN D3) 25 MCG (1000 UNIT) tablet Take 2 tablets (2,000 Units total) by mouth daily.     clotrimazole-betamethasone (LOTRISONE) cream APPLY TO AFFECTED AREA TWICE A DAY 45 g 0   diclofenac Sodium (VOLTAREN) 1 % GEL APPLY 4 G  TOPICALLY 4 (FOUR) TIMES DAILY. APPLY TO RIGHT LATERAL HIP AND THIGH DOWN TO KNEE (Patient taking differently: Apply 4 g topically daily as needed (For pain).) 300 g 1   folic acid (FOLVITE) 1 MG tablet Take 1 mg by mouth daily.     furosemide (LASIX) 20 MG tablet TAKE 1 TABLET BY MOUTH EVERY DAY 90 tablet 1   furosemide (LASIX) 40 MG tablet Take 40 mg by mouth daily.     KLOR-CON M20 20 MEQ tablet TAKE 1 TABLET BY MOUTH TWICE A DAY 180 tablet 1   lidocaine-prilocaine (EMLA) cream Apply to affected area once 30 g 3   Menthol-Methyl Salicylate (MUSCLE RUB) 10-15 % CREA Apply 1 Application topically daily as needed for muscle pain.     metoprolol succinate (TOPROL XL) 25 MG 24 hr tablet Take 1 tablet (25 mg total) by mouth daily. 90 tablet 3   Multiple Vitamin (MULTIVITAMIN WITH MINERALS)  TABS tablet Take 1 tablet by mouth daily.     ondansetron (ZOFRAN) 8 MG tablet Take 1 tablet (8 mg total) by mouth every 8 (eight) hours as needed for nausea or vomiting. Start on the third day after cyclophosphamide chemotherapy. 30 tablet 1   oxyCODONE (OXY IR/ROXICODONE) 5 MG immediate release tablet Take 1 tablet (5 mg total) by mouth every 6 (six) hours as needed for severe pain. 15 tablet 0   pantoprazole (PROTONIX) 40 MG tablet Take 1 tablet (40 mg total) by mouth 2 (two) times daily. For 12 weeks  \   predniSONE (DELTASONE) 50 MG tablet TAKE 1 TABLET BY MOUTH EVERY DAY WITH BREAKFAST FOR 5 DAYS STARTING THE DAY AFTER EACH CHEMOTHERAPY 25 tablet 0   Tenofovir Alafenamide Fumarate (VEMLIDY) 25 MG TABS Take 1 tablet (25 mg total) by mouth daily. 30 tablet 5   No current facility-administered medications for this visit.    PHYSICAL EXAMINATION: ECOG PERFORMANCE STATUS: 1 - Symptomatic but completely ambulatory VSS GENERAL:alert, in no acute distress and comfortable SKIN: no acute rashes, no significant lesions EYES: conjunctiva are pink and non-injected, sclera anicteric OROPHARYNX: MMM, no exudates, no  oropharyngeal erythema or ulceration NECK: supple, no JVD LYMPH:  no palpable lymphadenopathy in the cervical, axillary or inguinal regions LUNGS: clear to auscultation b/l with normal respiratory effort HEART: regular rate & rhythm ABDOMEN:  normoactive bowel sounds , non tender, not distended. Extremity: no pedal edema PSYCH: alert & oriented x 3 with fluent speech NEURO: no focal motor/sensory deficits   LABORATORY DATA:   I have reviewed the data as listed .    Latest Ref Rng & Units 12/05/2022    8:01 AM 11/15/2022    8:30 AM 10/23/2022    8:08 AM  CBC  WBC 4.0 - 10.5 K/uL 4.4  5.1  5.2   Hemoglobin 13.0 - 17.0 g/dL 82.9  56.2  13.0   Hematocrit 39.0 - 52.0 % 41.2  37.7  37.6   Platelets 150 - 400 K/uL 222  197  219    .    Latest Ref Rng & Units 12/05/2022    8:01 AM 11/15/2022    8:30 AM 10/23/2022    8:08 AM  CMP  Glucose 70 - 99 mg/dL 865  784  696   BUN 8 - 23 mg/dL 11  12  8    Creatinine 0.61 - 1.24 mg/dL 2.95  2.84  1.32   Sodium 135 - 145 mmol/L 141  141  143   Potassium 3.5 - 5.1 mmol/L 3.4  3.4  3.5   Chloride 98 - 111 mmol/L 106  107  107   CO2 22 - 32 mmol/L 28  28  28    Calcium 8.9 - 10.3 mg/dL 9.3  9.0  9.2   Total Protein 6.5 - 8.1 g/dL 6.6  6.4  6.5   Total Bilirubin 0.3 - 1.2 mg/dL 0.3  0.3  0.4   Alkaline Phos 38 - 126 U/L 93  92  138   AST 15 - 41 U/L 29  26  22    ALT 0 - 44 U/L 34  31  23    Lymph node biopsy 07/16/2022    RADIOGRAPHIC STUDIES: I have personally reviewed the radiological images as listed and agreed with the findings in the report. No results found.  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 Typically would have done daEPOCH-R but with his r/o MDS high risk of cytopenias esp with etoposide Status post mini CHOP on 08/06/2022.  Expect nadir in  blood counts 10 to 12 days out. Rituxan was held due to significant tumor lysis and multifactorial AKI.  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  # 7 Anemia and mild thrombocytopenia -- likely multifactorial -- lymphoma + MDS+ >?sepsis r/o GI bleeding.  And now due to his mini CHOP chemotherapy.  #8 Abdominal distention with partial obstruction versus ileus.  # 9 Hep B c AB +ve, Hep B SAg neg HepB SAb neg, HBV DNA PCR --neg.  #10 acute kidney injury with sodium level less than 20 possibly related to prerenal causes versus contrast nephropathy versus high risk of tumor lysis syndrome. Renal ultrasound with no obstruction Received 1 dose of rasburicase on 6/12 and 1 dose of rasburicase 6 mg on 6/13. Currently on CRRT.  11 neutropenia from chemotherapy-continues to be on G-CSF  PLAN: -Discussed lab results from today, 12/05/2022, in detail with the patient. CBC is stable. CMP shows slightly decreased potassium level at 3.4, but stable overall.  -Discussed with the patient that he can go to dentist regarding cavities around 1 month after his last chemotherapy treatment . -Schedule patient for PET scan in 2-3 weeks.  -Plan to remove Port-A-cath after PET scan. -Urine analysis during this visit for UTI concerns. -Patient tolerated cycle 5 of his treatment well without any severe toxicities.  -Patient can proceed with cycle 6 of his treatment without any dose modifications.   FOLLOW-UP: PEt/CT in 2 weeks RTC with Dr Candise Che with portflush and labs in 4 weeks  The total time spent in the appointment was 32 minutes* .  All of the patient's questions were answered with apparent satisfaction. The patient knows to call the clinic with any  problems, questions or concerns.   Wyvonnia Lora MD MS AAHIVMS [facility] Hematology/Oncology Physician [facility]  .*Total Encounter Time as defined by the Centers for Medicare and Medicaid Services includes, in addition to the face-to-face time of a patient visit (documented in the note above) non-face-to-face time: obtaining and reviewing outside history, ordering and reviewing medications, tests or procedures, care coordination (communications with other health care professionals or caregivers) and documentation in the medical record.    I,Param Shah,acting as a Neurosurgeon for Wyvonnia Lora, MD.,have documented all relevant documentation on the behalf of Wyvonnia Lora, MD,as directed by  Wyvonnia Lora, MD while in the presence of Wyvonnia Lora, MD.  .I have reviewed the above documentation for accuracy and completeness, and I agree with the above. Johney Maine MD

## 2022-12-05 NOTE — Patient Instructions (Signed)
[facility]  Discharge Instructions: Thank you for choosing [facility] to provide your oncology and hematology care.   If you have a lab appointment with the [facility], please go directly to the [facility] and check in at the registration area.   Wear comfortable clothing and clothing appropriate for easy access to any Portacath or PICC line.   We strive to give you quality time with your provider. You may need to reschedule your appointment if you arrive late (15 or more minutes).  Arriving late affects you and other patients whose appointments are after yours.  Also, if you miss three or more appointments without notifying the office, you may be dismissed from the clinic at the provider's discretion.      For prescription refill requests, have your pharmacy contact our office and allow 72 hours for refills to be completed.    Today you received the following chemotherapy and/or immunotherapy agents: Doxorubicin, Vincristine, Cytoxan, and Rituximab       To help prevent nausea and vomiting after your treatment, we encourage you to take your nausea medication as directed.  BELOW ARE SYMPTOMS THAT SHOULD BE REPORTED IMMEDIATELY: *FEVER GREATER THAN 100.4 F (38 C) OR HIGHER *CHILLS OR SWEATING *NAUSEA AND VOMITING THAT IS NOT CONTROLLED WITH YOUR NAUSEA MEDICATION *UNUSUAL SHORTNESS OF BREATH *UNUSUAL BRUISING OR BLEEDING *URINARY PROBLEMS (pain or burning when urinating, or frequent urination) *BOWEL PROBLEMS (unusual diarrhea, constipation, pain near the anus) TENDERNESS IN MOUTH AND THROAT WITH OR WITHOUT PRESENCE OF ULCERS (sore throat, sores in mouth, or a toothache) UNUSUAL RASH, SWELLING OR PAIN  UNUSUAL VAGINAL DISCHARGE OR ITCHING   Items with * indicate a potential emergency and should be followed up as soon as possible or go to the Emergency Department if any problems should occur.  Please show the CHEMOTHERAPY ALERT  CARD or IMMUNOTHERAPY ALERT CARD at check-in to the Emergency Department and triage nurse.  Should you have questions after your visit or need to cancel or reschedule your appointment, please contact [facility]  Dept: [phone]  and follow the prompts.  Office hours are 8:00 a.m. to 4:30 p.m. Monday - Friday. Please note that voicemails left after 4:00 p.m. may not be returned until the following business day.  We are closed weekends and major holidays. You have access to a nurse at all times for urgent questions. Please call the main number to the clinic Dept: [phone] and follow the prompts.   For any non-urgent questions, you may also contact your provider using MyChart. We now offer e-Visits for anyone 44 and older to request care online for non-urgent symptoms. For details visit [email or web address].   Also download the MyChart app! Go to the app store, search "MyChart", open the app, select [facility], and log in with your MyChart username and password.

## 2022-12-06 ENCOUNTER — Ambulatory Visit: Payer: 59

## 2022-12-06 ENCOUNTER — Other Ambulatory Visit: Payer: 59

## 2022-12-06 ENCOUNTER — Ambulatory Visit: Payer: 59 | Admitting: Hematology

## 2022-12-07 ENCOUNTER — Inpatient Hospital Stay: Payer: 59

## 2022-12-07 VITALS — BP 140/76 | HR 64 | Temp 98.0°F | Resp 16

## 2022-12-07 DIAGNOSIS — I480 Paroxysmal atrial fibrillation: Secondary | ICD-10-CM | POA: Diagnosis not present

## 2022-12-07 DIAGNOSIS — C8338 Diffuse large B-cell lymphoma, lymph nodes of multiple sites: Secondary | ICD-10-CM

## 2022-12-07 DIAGNOSIS — Z5189 Encounter for other specified aftercare: Secondary | ICD-10-CM | POA: Diagnosis not present

## 2022-12-07 DIAGNOSIS — T451X5A Adverse effect of antineoplastic and immunosuppressive drugs, initial encounter: Secondary | ICD-10-CM | POA: Diagnosis not present

## 2022-12-07 DIAGNOSIS — Z79899 Other long term (current) drug therapy: Secondary | ICD-10-CM | POA: Diagnosis not present

## 2022-12-07 DIAGNOSIS — Z8546 Personal history of malignant neoplasm of prostate: Secondary | ICD-10-CM | POA: Diagnosis not present

## 2022-12-07 DIAGNOSIS — D701 Agranulocytosis secondary to cancer chemotherapy: Secondary | ICD-10-CM | POA: Diagnosis not present

## 2022-12-07 DIAGNOSIS — Z5111 Encounter for antineoplastic chemotherapy: Secondary | ICD-10-CM | POA: Diagnosis not present

## 2022-12-07 DIAGNOSIS — Z7952 Long term (current) use of systemic steroids: Secondary | ICD-10-CM | POA: Diagnosis not present

## 2022-12-07 DIAGNOSIS — Z5112 Encounter for antineoplastic immunotherapy: Secondary | ICD-10-CM | POA: Diagnosis not present

## 2022-12-07 DIAGNOSIS — Z86711 Personal history of pulmonary embolism: Secondary | ICD-10-CM | POA: Diagnosis not present

## 2022-12-07 DIAGNOSIS — Z79624 Long term (current) use of inhibitors of nucleotide synthesis: Secondary | ICD-10-CM | POA: Diagnosis not present

## 2022-12-07 MED ORDER — PEGFILGRASTIM-JMDB 6 MG/0.6ML ~~LOC~~ SOSY
6.0000 mg | PREFILLED_SYRINGE | Freq: Once | SUBCUTANEOUS | Status: AC
  Administered 2022-12-07: 6 mg via SUBCUTANEOUS
  Filled 2022-12-07: qty 0.6

## 2022-12-07 NOTE — Patient Instructions (Signed)

## 2022-12-09 ENCOUNTER — Ambulatory Visit: Payer: 59

## 2022-12-11 ENCOUNTER — Encounter: Payer: Self-pay | Admitting: Hematology

## 2022-12-14 ENCOUNTER — Other Ambulatory Visit: Payer: Self-pay | Admitting: Hematology

## 2022-12-16 ENCOUNTER — Other Ambulatory Visit: Payer: Self-pay | Admitting: Hematology

## 2022-12-16 ENCOUNTER — Telehealth: Payer: Self-pay | Admitting: Hematology

## 2022-12-16 DIAGNOSIS — C8338 Diffuse large B-cell lymphoma, lymph nodes of multiple sites: Secondary | ICD-10-CM

## 2022-12-16 NOTE — Telephone Encounter (Signed)
Patient's daughter is aware of scheduled appointment times/dates

## 2022-12-16 NOTE — Progress Notes (Signed)
PET/CT order is in.

## 2022-12-17 ENCOUNTER — Other Ambulatory Visit: Payer: Self-pay

## 2022-12-18 ENCOUNTER — Encounter: Payer: Self-pay | Admitting: Hematology

## 2022-12-27 ENCOUNTER — Ambulatory Visit (HOSPITAL_COMMUNITY)
Admission: RE | Admit: 2022-12-27 | Discharge: 2022-12-27 | Disposition: A | Discharge status: Home / Self Care | Payer: 59 | Source: Ambulatory Visit | Attending: Hematology | Admitting: Hematology

## 2022-12-27 DIAGNOSIS — R918 Other nonspecific abnormal finding of lung field: Secondary | ICD-10-CM | POA: Diagnosis not present

## 2022-12-27 DIAGNOSIS — C8338 Diffuse large B-cell lymphoma, lymph nodes of multiple sites: Secondary | ICD-10-CM | POA: Insufficient documentation

## 2022-12-27 DIAGNOSIS — C8511 Unspecified B-cell lymphoma, lymph nodes of head, face, and neck: Secondary | ICD-10-CM | POA: Diagnosis not present

## 2022-12-27 LAB — GLUCOSE, CAPILLARY: Glucose-Capillary: 76 mg/dL (ref 70–99)

## 2022-12-27 MED ORDER — FLUDEOXYGLUCOSE F - 18 (FDG) INJECTION
8.8000 | Freq: Once | INTRAVENOUS | Status: AC
  Administered 2022-12-27: 8.74 via INTRAVENOUS

## 2022-12-29 NOTE — Progress Notes (Unsigned)
Cardiology Office Note:  .   Date:  12/30/2022  ID:  Tyler [name], DOB Jan 20, 1951, MRN [id number] PCP: Nechama Guard, FNP  [facility] Providers Cardiologist:  Reatha Harps, MD { History of Present Illness: .   Tyler [name] is a 72 y.o. male with history of B cell lymphoma. Afib, anemia who presents for follow-up.   Discussed the use of AI scribe software for clinical note transcription with the patient, who gave verbal consent to proceed.  History of Present Illness   Tyler [name], a 72 year old male with a history of diffuse large B cell lymphoma, prostate cancer, paroxysmal atrial fibrillation, and GI bleed, presents for a follow-up visit. He is currently undergoing chemotherapy for his lymphoma. He had an episode of atrial fibrillation in the hospital in June but has not been anticoagulated due to anemia and thrombocytopenia. His blood counts have since improved. He reports no current symptoms of chest pain, trouble breathing, or heart racing. He denies any swelling in his legs. His blood pressure is slightly elevated at 142/80. He denies CP or SOB. No palpitatins.         Problem List Diffuse Large B cell Lymphoma  Prostate CA s/p prostatectomy  Paroxysmal Afib -07/2022 -> sepsis -no AC due to GI bleed, anemia/CA -CHADSVASC=2 (age, HTN) 4. GI Bleed -gastric ulcer 08/21/2022 5. Anemia  -2/2 malignancy  6. AKI -tumor lysis related  -brief HD    ROS: All other ROS reviewed and negative. Pertinent positives noted in the HPI.     Studies Reviewed: [name]        TTE 10/03/2022  1. Left ventricular ejection fraction, by estimation, is 60 to 65%. The  left ventricle has normal function. The left ventricle has no regional  wall motion abnormalities. There is mild concentric left ventricular  hypertrophy. Left ventricular diastolic  parameters are consistent with Grade I diastolic dysfunction (impaired  relaxation). The average left ventricular global longitudinal strain is   -21.9 %. The global longitudinal strain is normal.   2. Right ventricular systolic function is normal. The right ventricular  size is normal. Tricuspid regurgitation signal is inadequate for assessing  PA pressure.   3. The mitral valve is normal in structure. Mild mitral valve  regurgitation. No evidence of mitral stenosis.   4. The aortic valve is tricuspid. Aortic valve regurgitation is not  visualized. No aortic stenosis is present.   5. The inferior vena cava is normal in size with greater than 50%  respiratory variability, suggesting right atrial pressure of 3 mmHg.  Physical Exam:   VS:  BP (!) 142/80 (BP Location: Left Arm, Patient Position: Sitting, Cuff Size: Normal)   Pulse 67   Ht 5\' 7"  (1.702 m)   Wt 181 lb 3.2 oz (82.2 kg)   SpO2 97%   BMI 28.38 kg/m    Wt Readings from Last 3 Encounters:  12/30/22 181 lb 3.2 oz (82.2 kg)  12/05/22 176 lb (79.8 kg)  11/15/22 175 lb 12.8 oz (79.7 kg)    GEN: Well nourished, well developed in no acute distress NECK: No JVD; No carotid bruits CARDIAC: RRR, no murmurs, rubs, gallops RESPIRATORY:  Clear to auscultation without rales, wheezing or rhonchi  ABDOMEN: Soft, non-tender, non-distended EXTREMITIES:  No edema; No deformity  ASSESSMENT AND PLAN: .   Assessment and Plan    Diffuse Large B Cell Lymphoma Currently undergoing chemotherapy. Blood counts have normalized since previous anemia and thrombocytopenia. -Continue current chemotherapy regimen as directed by  oncologist. -Reach out to oncologist to discuss safety of anticoagulation.  Paroxysmal Atrial Fibrillation No recurrence since diagnosis in June. Blood counts have normalized, potentially allowing for anticoagulation. -Continue Metoprolol Succinate 25mg  daily. -Reach out to oncologist to discuss safety of anticoagulation.  Hypertension Blood pressure slightly elevated at 142/80. Patient reports values seem controlled at home. -Continue Metoprolol Succinate 25mg   daily. -Monitor blood pressure at home.  Edema No current symptoms reported. -Reduce Lasix to 20mg  daily as needed for swelling.  Follow-up in 6 months. Will reach out to patient regarding anticoagulation after discussion with oncologist.              Follow-up: Return in about 6 months (around 06/29/2023).  Time Spent with Patient: I have spent a total of 25 minutes caring for this patient today face to face, ordering and reviewing labs/tests, reviewing prior records/medical history, examining the patient, establishing an assessment and plan, communicating results/findings to the patient/family, and documenting in the medical record.   Signed, Lenna Gilford. Flora Lipps, MD, [facility]  [facility]  [address], [address] [place], [place] [address] [phone]  9:16 AM

## 2022-12-30 ENCOUNTER — Ambulatory Visit: Payer: 59 | Attending: Cardiovascular Disease | Admitting: Cardiovascular Disease

## 2022-12-30 ENCOUNTER — Encounter: Payer: Self-pay | Admitting: Cardiovascular Disease

## 2022-12-30 VITALS — BP 142/80 | HR 67 | Ht 67.0 in | Wt 181.2 lb

## 2022-12-30 DIAGNOSIS — I480 Paroxysmal atrial fibrillation: Secondary | ICD-10-CM | POA: Diagnosis not present

## 2022-12-30 MED ORDER — METOPROLOL SUCCINATE ER 25 MG PO TB24: 25.0000 mg | ORAL_TABLET | Freq: Every day | ORAL | 3 refills | Status: DC

## 2022-12-30 MED ORDER — FUROSEMIDE 20 MG PO TABS: 20.0000 mg | ORAL_TABLET | Freq: Every day | ORAL | 1 refills | Status: DC

## 2022-12-30 NOTE — Patient Instructions (Addendum)
Medication Instructions:  - Refilled Metoprolol Succinate 25mg  daily  - Refilled Lasix (FUROSEMIDE) 20MG  DAILY    *If you need a refill on your cardiac medications before your next appointment, please call your pharmacy*   Lab Work: NONE    If you have labs (blood work) drawn today and your tests are completely normal, you will receive your results only by: MyChart Message (if you have MyChart) OR A paper copy in the mail If you have any lab test that is abnormal or we need to change your treatment, we will call you to review the results.   Testing/Procedures: NONE    Follow-Up: At [facility], you and your health needs are our priority.  As part of our continuing mission to provide you with exceptional heart care, we have created designated Provider Care Teams.  These Care Teams include your primary Cardiologist (physician) and Advanced Practice Providers (APPs -  Physician Assistants and Nurse Practitioners) who all work together to provide you with the care you need, when you need it.  We recommend signing up for the patient portal called "MyChart".  Sign up information is provided on this After Visit Summary.  MyChart is used to connect with patients for Virtual Visits (Telemedicine).  Patients are able to view lab/test results, encounter notes, upcoming appointments, etc.  Non-urgent messages can be sent to your provider as well.   To learn more about what you can do with MyChart, go to [email or web address].    Your next appointment:   6 month(s)  The format for your next appointment:   In Person  Provider:   Reatha Harps, MD    Other Instructions

## 2023-01-13 ENCOUNTER — Other Ambulatory Visit: Payer: Self-pay

## 2023-01-13 DIAGNOSIS — C8338 Diffuse large B-cell lymphoma, lymph nodes of multiple sites: Secondary | ICD-10-CM

## 2023-01-15 ENCOUNTER — Inpatient Hospital Stay: Payer: 59 | Attending: Hematology

## 2023-01-15 ENCOUNTER — Inpatient Hospital Stay (HOSPITAL_BASED_OUTPATIENT_CLINIC_OR_DEPARTMENT_OTHER): Payer: 59 | Admitting: Hematology

## 2023-01-15 ENCOUNTER — Other Ambulatory Visit: Payer: Self-pay

## 2023-01-15 VITALS — BP 148/92 | HR 62 | Temp 97.3°F | Resp 18 | Wt 180.5 lb

## 2023-01-15 DIAGNOSIS — C8338 Diffuse large B-cell lymphoma, lymph nodes of multiple sites: Secondary | ICD-10-CM | POA: Insufficient documentation

## 2023-01-15 DIAGNOSIS — M549 Dorsalgia, unspecified: Secondary | ICD-10-CM | POA: Insufficient documentation

## 2023-01-15 DIAGNOSIS — Z95828 Presence of other vascular implants and grafts: Secondary | ICD-10-CM

## 2023-01-15 DIAGNOSIS — Z452 Encounter for adjustment and management of vascular access device: Secondary | ICD-10-CM | POA: Insufficient documentation

## 2023-01-15 DIAGNOSIS — R350 Frequency of micturition: Secondary | ICD-10-CM | POA: Insufficient documentation

## 2023-01-15 DIAGNOSIS — Z8546 Personal history of malignant neoplasm of prostate: Secondary | ICD-10-CM | POA: Insufficient documentation

## 2023-01-15 LAB — CMP (CANCER CENTER ONLY)
ALT: 46 U/L — ABNORMAL HIGH (ref 0–44)
AST: 38 U/L (ref 15–41)
Albumin: 4.2 g/dL (ref 3.5–5.0)
Alkaline Phosphatase: 100 U/L (ref 38–126)
Anion gap: 4 — ABNORMAL LOW (ref 5–15)
BUN: 12 mg/dL (ref 8–23)
CO2: 30 mmol/L (ref 22–32)
Calcium: 9.1 mg/dL (ref 8.9–10.3)
Chloride: 107 mmol/L (ref 98–111)
Creatinine: 0.84 mg/dL (ref 0.61–1.24)
GFR, Estimated: >60 mL/min (ref >60)
Glucose, Bld: 118 mg/dL — ABNORMAL HIGH (ref 70–99)
Potassium: 3.6 mmol/L (ref 3.5–5.1)
Sodium: 141 mmol/L (ref 135–145)
Total Bilirubin: 0.5 mg/dL (ref <1.2)
Total Protein: 6.6 g/dL (ref 6.5–8.1)

## 2023-01-15 LAB — CBC WITH DIFFERENTIAL (CANCER CENTER ONLY)
Abs Immature Granulocytes: 0.01 10*3/uL (ref 0.00–0.07)
Basophils Absolute: 0 10*3/uL (ref 0.0–0.1)
Basophils Relative: 0 %
Eosinophils Absolute: 0 10*3/uL (ref 0.0–0.5)
Eosinophils Relative: 1 %
HCT: 41.2 % (ref 39.0–52.0)
Hemoglobin: 13.9 g/dL (ref 13.0–17.0)
Immature Granulocytes: 0 %
Lymphocytes Relative: 40 %
Lymphs Abs: 2.1 10*3/uL (ref 0.7–4.0)
MCH: 29.8 pg (ref 26.0–34.0)
MCHC: 33.7 g/dL (ref 30.0–36.0)
MCV: 88.2 fL (ref 80.0–100.0)
Monocytes Absolute: 0.7 10*3/uL (ref 0.1–1.0)
Monocytes Relative: 14 %
Neutro Abs: 2.3 10*3/uL (ref 1.7–7.7)
Neutrophils Relative %: 45 %
Platelet Count: 164 10*3/uL (ref 150–400)
RBC: 4.67 MIL/uL (ref 4.22–5.81)
RDW: 13.6 % (ref 11.5–15.5)
WBC Count: 5.2 10*3/uL (ref 4.0–10.5)
nRBC: 0 % (ref 0.0–0.2)

## 2023-01-15 LAB — URINALYSIS, COMPLETE (UACMP) WITH MICROSCOPIC
Bacteria, UA: NONE SEEN
Bilirubin Urine: NEGATIVE
Glucose, UA: NEGATIVE mg/dL
Hgb urine dipstick: NEGATIVE
Ketones, ur: NEGATIVE mg/dL
Leukocytes,Ua: NEGATIVE
Nitrite: NEGATIVE
Protein, ur: NEGATIVE mg/dL
Specific Gravity, Urine: 1.009 (ref 1.005–1.030)
pH: 5 (ref 5.0–8.0)

## 2023-01-15 MED ORDER — SODIUM CHLORIDE 0.9% FLUSH
10.0000 mL | INTRAVENOUS | Status: DC | PRN
Start: 2023-01-15 — End: 2023-01-15
  Administered 2023-01-15: 10 mL

## 2023-01-15 MED ORDER — HEPARIN SOD (PORK) LOCK FLUSH 100 UNIT/ML IV SOLN
500.0000 [IU] | Freq: Once | INTRAVENOUS | Status: AC | PRN
Start: 2023-01-15 — End: 2023-01-15
  Administered 2023-01-15: 500 [IU]

## 2023-01-15 NOTE — Progress Notes (Signed)
HEMATOLOGY ONCOLOGY CLINIC NOTE  Date of service: 01/15/23    Patient Care Team: Nechama Guard, FNP as PCP - General (Family Medicine) O'Neal, Ronnald Ramp, MD as PCP - Cardiology (Cardiology) Johney Maine, MD as Consulting Physician (Hematology)  CC f/u for large B cell lymphoma  SUMMARY OF ONCOLOGIC HISTORY: Oncology History  Diffuse large B-cell lymphoma of lymph nodes of multiple regions [facility])  07/25/2022 Initial Diagnosis   Diffuse large B-cell lymphoma of lymph nodes of multiple regions (HCC)   08/06/2022 -  Chemotherapy   Patient is on Treatment Plan : NON-HODGKINS LYMPHOMA R-CHOP q21d       HISTORY OF PRESENTING ILLNESS: Tyler [name] is a 72 y.o. male with a past medical history of prostate cancer status post prostatectomy in 2021, history of bilateral pulmonary embolism after his surgery in 2021 presenting today for follow up and management of MDS.  He originally presented to the ED on 11/28/21 for low back pain with radiation down his leg. He was diagnosed with sciatica at urgent care prior to the ED visit. He had a negative xray and CT. He returned to the ED after developing a fever and hematuria. His urologist had stopped his Eliquis 3 months earlier. While in the ED on 12/10/21 he was noted to be febrile with blood work showing HGB of 6.2 and a UTI. His stool was heme-negative. He was hospitalized for treatment of his UTI with secondary sepsis.  Today, he states that he has been doing okay since our last visit. He feels that he is getting stronger.   However, he continues to have low back pain which is worsened with bending over and lying down on his back. His pain radiates into his lateral right leg. He has been walking for half an hour or so but notes that his pain worsens with walking in the cold. He has not been working since I last saw him. His back pain has improved somewhat since he stopped working. He is scheduled to see his PCP next week and plans to  discuss referral for orthospine at that time.  He notes some dizziness when he takes some of his medications.  Since his last visit he has completely stopped drinking alcohol and use of Ibuprofen. His appetite has improved. His HGB has continued to improve to 10.1. His platelets have also improved to 74,000. His WBC counts continue to remain WNL. CMP also improved.  He reports having some enlarged lymph nodes in the left anterior cervical region x1 month. He denies any dental problems. He denies any other lumps or bumps.  He reports a recent episode of chills. He denies any recent vaccines. He denies signs of infection such as sore throat, sinus drainage, cough, or urinary symptoms. He denies fevers. He denies nausea, vomiting, chest pain, dyspnea or cough. He denies any bowel changes. His weight has decreased 6 pounds over last 1.5 months .   INTERVAL HISTORY:  Tyler [name] is a 72 y.o. male here for continued evaluation and management of his low grade MDS.   Patient was last seen by me on 12/05/2022 and reported increased tooth sensitivity with cold foods with cavities, frequent urination, and enlarged prostate.   Today, he is accompanied by his son and an interpreter. Patient reports that his symptoms have improved overall.   He reports that his weight has been stable and he is eating normally. He denies any swallowing issues, breathing issues, abdominal pain, back pain, or change in bowel habits.  Patient complains of foaming in his urine daily. He reports that his symptoms began when he was given an injection. He reports that he did not have a urine test as ordered with his last visit. Patient reports that he sometimes endorses itching prior to urinating.   His son reports that patient did have prostate cancer in 2021. Patient did have atrial fibrillation at this time and he was previously on eliquis. Patient was seen by his cardiologist on November 5th.  He reports that he does not  need to use any acid reflux medication.  REVIEW OF SYSTEMS:    10 Point review of Systems was done is negative except as noted above.   Past Medical History:  Diagnosis Date   Cancer [facility])    prostate   Follicular lymphoma (HCC) 2024   History of pulmonary embolism    Hypertension    Myelodysplastic syndrome (HCC)    PAF (paroxysmal atrial fibrillation) (HCC)    Recurrent UTI    Past Surgical History:  Procedure Laterality Date   BIOPSY  08/21/2022   Procedure: BIOPSY;  Surgeon: Beverley Fiedler, MD;  Location: [facility] ENDOSCOPY;  Service: Gastroenterology;;   ESOPHAGOGASTRODUODENOSCOPY (EGD) WITH PROPOFOL N/A 08/21/2022   Procedure: ESOPHAGOGASTRODUODENOSCOPY (EGD) WITH PROPOFOL;  Surgeon: Beverley Fiedler, MD;  Location: [facility] ENDOSCOPY;  Service: Gastroenterology;  Laterality: N/A;   IR IMAGING GUIDED PORT INSERTION  08/26/2022   LYMPHADENECTOMY Bilateral 09/01/2019   Procedure: LYMPHADENECTOMY;  Surgeon: Sebastian Ache, MD;  Location: [facility] ORS;  Service: Urology;  Laterality: Bilateral;   ROBOT ASSISTED LAPAROSCOPIC RADICAL PROSTATECTOMY N/A 09/01/2019   Procedure: XI ROBOTIC ASSISTED LAPAROSCOPIC RADICAL PROSTATECTOMY;  Surgeon: Sebastian Ache, MD;  Location: [facility] ORS;  Service: Urology;  Laterality: N/A;  3 HRS   Social History   Tobacco Use   Smoking status: Never   Smokeless tobacco: Never  Vaping Use   Vaping status: Never Used  Substance Use Topics   Alcohol use: Yes    Comment: at least one 40 oz beer daily   Drug use: Never    ALLERGIES:  is allergic to amitriptyline, amlodipine, and neurontin [gabapentin].  MEDICATIONS:  Current Outpatient Medications  Medication Sig Dispense Refill   B Complex Vitamins (VITAMIN B COMPLEX) TABS Take 1 tablet by mouth daily. 30 tablet 0   cholecalciferol (VITAMIN D3) 25 MCG (1000 UNIT) tablet Take 2 tablets (2,000 Units total) by mouth daily.     clotrimazole-betamethasone (LOTRISONE) cream APPLY TO AFFECTED AREA TWICE A DAY 45 g 0    diclofenac Sodium (VOLTAREN) 1 % GEL APPLY 4 G TOPICALLY 4 (FOUR) TIMES DAILY. APPLY TO RIGHT LATERAL HIP AND THIGH DOWN TO KNEE (Patient taking differently: Apply 4 g topically daily as needed (For pain).) 300 g 1   folic acid (FOLVITE) 1 MG tablet Take 1 mg by mouth daily.     furosemide (LASIX) 20 MG tablet Take 1 tablet (20 mg total) by mouth daily. 90 tablet 1   KLOR-CON M20 20 MEQ tablet TAKE 1 TABLET BY MOUTH TWICE A DAY 180 tablet 1   lidocaine-prilocaine (EMLA) cream Apply to affected area once 30 g 3   Menthol-Methyl Salicylate (MUSCLE RUB) 10-15 % CREA Apply 1 Application topically daily as needed for muscle pain.     metoprolol succinate (TOPROL XL) 25 MG 24 hr tablet Take 1 tablet (25 mg total) by mouth daily. 90 tablet 3   Multiple Vitamin (MULTIVITAMIN WITH MINERALS) TABS tablet Take 1 tablet by mouth daily.  ondansetron (ZOFRAN) 8 MG tablet Take 1 tablet (8 mg total) by mouth every 8 (eight) hours as needed for nausea or vomiting. Start on the third day after cyclophosphamide chemotherapy. 30 tablet 1   oxyCODONE (OXY IR/ROXICODONE) 5 MG immediate release tablet Take 1 tablet (5 mg total) by mouth every 6 (six) hours as needed for severe pain. 15 tablet 0   pantoprazole (PROTONIX) 40 MG tablet Take 1 tablet (40 mg total) by mouth 2 (two) times daily. For 12 weeks  \   predniSONE (DELTASONE) 50 MG tablet TAKE 1 TABLET BY MOUTH EVERY DAY WITH BREAKFAST FOR 5 DAYS STARTING THE DAY AFTER EACH CHEMOTHERAPY 25 tablet 0   Tenofovir Alafenamide Fumarate (VEMLIDY) 25 MG TABS Take 1 tablet (25 mg total) by mouth daily. 30 tablet 5   No current facility-administered medications for this visit.    PHYSICAL EXAMINATION: ECOG PERFORMANCE STATUS: 1 - Symptomatic but completely ambulatory .BP (!) 148/92   Pulse 62   Temp (!) 97.3 F (36.3 C)   Resp 18   Wt 180 lb 8 oz (81.9 kg)   SpO2 97%   BMI 28.27 kg/m    GENERAL:alert, in no acute distress and comfortable SKIN: no acute rashes,  no significant lesions EYES: conjunctiva are pink and non-injected, sclera anicteric OROPHARYNX: MMM, no exudates, no oropharyngeal erythema or ulceration NECK: supple, no JVD LYMPH:  no palpable lymphadenopathy in the cervical, axillary or inguinal regions LUNGS: clear to auscultation b/l with normal respiratory effort HEART: regular rate & rhythm ABDOMEN:  normoactive bowel sounds , non tender, not distended. Extremity: no pedal edema PSYCH: alert & oriented x 3 with fluent speech NEURO: no focal motor/sensory deficits   LABORATORY DATA:   I have reviewed the data as listed .    Latest Ref Rng & Units 01/15/2023    1:00 PM 12/05/2022    8:01 AM 11/15/2022    8:30 AM  CBC  WBC 4.0 - 10.5 K/uL 5.2  4.4  5.1   Hemoglobin 13.0 - 17.0 g/dL 16.1  09.6  04.5   Hematocrit 39.0 - 52.0 % 41.2  41.2  37.7   Platelets 150 - 400 K/uL 164  222  197    .    Latest Ref Rng & Units 01/15/2023    1:00 PM 12/05/2022    8:01 AM 11/15/2022    8:30 AM  CMP  Glucose 70 - 99 mg/dL 409  811  914   BUN 8 - 23 mg/dL 12  11  12    Creatinine 0.61 - 1.24 mg/dL 7.82  9.56  2.13   Sodium 135 - 145 mmol/L 141  141  141   Potassium 3.5 - 5.1 mmol/L 3.6  3.4  3.4   Chloride 98 - 111 mmol/L 107  106  107   CO2 22 - 32 mmol/L 30  28  28    Calcium 8.9 - 10.3 mg/dL 9.1  9.3  9.0   Total Protein 6.5 - 8.1 g/dL 6.6  6.6  6.4   Total Bilirubin <1.2 mg/dL 0.5  0.3  0.3   Alkaline Phos 38 - 126 U/L 100  93  92   AST 15 - 41 U/L 38  29  26   ALT 0 - 44 U/L 46  34  31    Lymph node biopsy 07/16/2022    RADIOGRAPHIC STUDIES: I have personally reviewed the radiological images as listed and agreed with the findings in the report. NM PET Image Restag (  PS) Skull Base To Thigh  Result Date: 01/14/2023 CLINICAL DATA:  Subsequent treatment strategy for diffuse large B-cell lymphoma. EXAM: NUCLEAR MEDICINE PET SKULL BASE TO THIGH TECHNIQUE: 8.7 mCi F-18 FDG was injected intravenously. Full-ring PET imaging was  performed from the skull base to thigh after the radiotracer. CT data was obtained and used for attenuation correction and anatomic localization. Fasting blood glucose: 76 mg/dl COMPARISON:  PET-CT dated 10/22/2022 FINDINGS: Mediastinal blood pool activity: SUV max 2.6 Liver activity: SUV max 3.9 NECK: No hypermetabolic cervical lymphadenopathy. Again noted is focal hypermetabolism along the deep lobe of the left parotid gland, without CT correlate, max SUV 6.5. Incidental CT findings: None. CHEST: Subpleural patchy opacity in the posteromedial right lower lobe (series 7/image 40), max SUV 4.7. Small right supraclavicular nodes measuring up to 9 mm short axis (series 4/image 38), max SUV 3.7. Small bilateral axillary nodes, measuring up to 11 mm short axis on the right and 8 mm short axis on the left (series 4/images 54 and 56), max SUV 3.5 and 2.7 respectively. Right chest port terminating in the upper right atrium. Incidental CT findings: Mild coronary atherosclerosis of the LAD. ABDOMEN/PELVIS: Reference splenic max SUV 3.1, without focal lesion. No abnormal hypermetabolism in the liver, pancreas, or adrenal glands. No evident bowel abdominopelvic lymphadenopathy. Incidental CT findings: Bilateral renal cysts, benign. Status post prostatectomy. SKELETON: No focal hypermetabolic activity to suggest skeletal metastasis. Incidental CT findings: Mild degenerative changes of the visualized thoracolumbar spine. IMPRESSION: Small right supraclavicular and bilateral axillary nodes. Deauville criteria 3. Subpleural patchy opacity in the posteromedial right lower lobe, likely infectious/inflammatory. Attention on follow-up is suggested. Status post prostatectomy. Electronically Signed   By: Charline Bills M.D.   On: 01/14/2023 00:06    ASSESSMENT & PLAN:   72 year old male with:  #1 High grade large b cell lymphoma  No evidence of double hit or triple hit lymphoma. Typically would have done daEPOCH-R but with  his r/o MDS high risk of cytopenias esp with etoposide Status post mini CHOP on 08/06/2022.  Expect nadir in  blood counts 10 to 12 days out.  #2 h/o Low grade MDS count had imrproved.  #3 Paroxysmal A fib-- previously not a candidate for anticoag due to anemia and thrombocytopenia-- now these have resolved. Patient notes he has f/u with his cardiology for this.  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 -on tenofovir for hep B reactivation suppression in the context of Rituxan use-- follows with ID 4 PLAN:  -Discussed lab results on 01/15/23 in detail with patient. CBC normal, showed WBC of 5.2K, hemoglobin of 13.9, and platelets of 164K. -CMP normal. Potassium is normal and kidney numbers normalized,  -12/27/2022 PET scan showed stable findings. No new findings suggestive of active lymphoma. All active lymphoma from his initial scan has disappeared at this time. Discussed that there are a couple areas of lymph nodes with borderline activity in the axillaries and near the collar bone. These lymph nodes are not enlarged and do not appear to be active.  -Patient is in remission at this time -no indication for biopsy at this time -no role for any additional treatment for lymphoma at this time -continue vitamin B complex -continue vitamin D -okay to stop folic acid -nausea medication as needed -patient shall proceed with urine test for further evaluation -will continue to keep port placed at this time  -Recommend patient to stay UTD with flu, RSV, new  COVID-19 vaccine -will continue to monitor every 3 months with labs -will plan for CT scan in 6 months -answered all of patient's and his son's questions in detail  -advised patient to connect with his infectious disease doctor to evaluate how long he should be on Tenofovir. Will plan for him to be on it for at least 6 months after his last chemotherapy.  -Patient shall continue to follow up with his cardiologist. If  patient continues to have Afib, that would be a reason to restart blood thinners  FOLLOW-UP: RTC with Dr Candise Che with portflush and labs in 3 months  The total time spent in the appointment was 35 minutes* .  All of the patient's questions were answered with apparent satisfaction. The patient knows to call the clinic with any problems, questions or concerns.   Wyvonnia Lora MD MS AAHIVMS [facility] Hematology/Oncology Physician [facility]  .*Total Encounter Time as defined by the Centers for Medicare and Medicaid Services includes, in addition to the face-to-face time of a patient visit (documented in the note above) non-face-to-face time: obtaining and reviewing outside history, ordering and reviewing medications, tests or procedures, care coordination (communications with other health care professionals or caregivers) and documentation in the medical record.    I,Mitra Faeizi,acting as a Neurosurgeon for Wyvonnia Lora, MD.,have documented all relevant documentation on the behalf of Wyvonnia Lora, MD,as directed by  Wyvonnia Lora, MD while in the presence of Wyvonnia Lora, MD.  .I have reviewed the above documentation for accuracy and completeness, and I agree with the above. Johney Maine MD

## 2023-01-16 ENCOUNTER — Telehealth: Payer: Self-pay | Admitting: Hematology

## 2023-01-16 LAB — URINE CULTURE: Culture: NO GROWTH

## 2023-01-16 NOTE — Telephone Encounter (Signed)
 Left patient a message in regards to scheduled appointment times/dates

## 2023-01-17 ENCOUNTER — Other Ambulatory Visit: Payer: Self-pay

## 2023-01-21 ENCOUNTER — Encounter: Payer: Self-pay | Admitting: Hematology

## 2023-01-22 NOTE — Progress Notes (Signed)
Contacted pt's daughter to:  let her know pt's UA and UCx show no evidence of UTI. Pt's daughter acknowledged information and verbalized understanding.

## 2023-01-28 ENCOUNTER — Encounter: Payer: Self-pay | Admitting: Internal Medicine

## 2023-01-28 ENCOUNTER — Other Ambulatory Visit: Payer: Self-pay

## 2023-01-28 ENCOUNTER — Ambulatory Visit (INDEPENDENT_AMBULATORY_CARE_PROVIDER_SITE_OTHER): Payer: 59 | Admitting: Internal Medicine

## 2023-01-28 VITALS — BP 157/90 | HR 54 | Temp 97.6°F | Wt 183.6 lb

## 2023-01-28 DIAGNOSIS — B181 Chronic viral hepatitis B without delta-agent: Secondary | ICD-10-CM | POA: Diagnosis not present

## 2023-01-28 NOTE — Progress Notes (Signed)
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   Acute renal failure with tubular necrosis (HCC) 08/08/2022   Aspiration into airway 08/08/2022   At high risk of tumor lysis syndrome 08/07/2022   Anemia 08/06/2022   Diffuse large B-cell lymphoma of lymph nodes of multiple regions (HCC) 07/25/2022   Encounter for antineoplastic chemotherapy 07/25/2022   Lumbar radiculopathy 05/31/2022   Symptomatic anemia    UTI (urinary tract infection) 12/10/2021   Septic shock (HCC) 12/10/2021   Normocytic anemia 12/10/2021   Thrombocytopenia (HCC) 12/10/2021   Hyponatremia 12/10/2021   Sepsis secondary to UTI (HCC) 12/10/2021   Pelvic hematoma in male 09/08/2019   Bilateral pulmonary embolism (HCC) 09/05/2019   Paroxysmal atrial fibrillation (HCC) 09/05/2019   Hypertension    Hypokalemia    Prostate cancer (HCC) 09/01/2019    Patient's Medications  New Prescriptions   No medications on file  Previous Medications   B COMPLEX VITAMINS (VITAMIN B COMPLEX) TABS    Take 1 tablet by mouth daily.   CHOLECALCIFEROL (VITAMIN D3) 25 MCG (1000 UNIT) TABLET    Take 2 tablets (2,000 Units total) by mouth daily.   CLOTRIMAZOLE-BETAMETHASONE (LOTRISONE) CREAM    APPLY TO AFFECTED AREA TWICE A DAY   DICLOFENAC SODIUM (VOLTAREN) 1 % GEL    APPLY 4 G TOPICALLY 4 (FOUR) TIMES DAILY. APPLY TO RIGHT LATERAL HIP AND THIGH DOWN TO KNEE   FOLIC ACID (FOLVITE) 1 MG TABLET    Take 1 mg by mouth daily.   FUROSEMIDE (LASIX) 20 MG TABLET    Take 1 tablet (20 mg total) by mouth daily.   KLOR-CON M20 20 MEQ TABLET    TAKE 1 TABLET BY MOUTH TWICE A DAY   LIDOCAINE-PRILOCAINE (EMLA) CREAM    Apply to affected area once   MENTHOL-METHYL SALICYLATE (MUSCLE  RUB) 10-15 % CREA    Apply 1 Application topically daily as needed for muscle pain.   METOPROLOL SUCCINATE (TOPROL XL) 25 MG 24 HR TABLET    Take 1 tablet (25 mg total) by mouth daily.   MULTIPLE VITAMIN (MULTIVITAMIN WITH MINERALS) TABS TABLET    Take 1 tablet by mouth daily.   ONDANSETRON (ZOFRAN) 8 MG TABLET    Take 1 tablet (8 mg total) by mouth every 8 (eight) hours as needed for nausea or vomiting. Start on the third day after cyclophosphamide chemotherapy.   OXYCODONE (OXY IR/ROXICODONE) 5 MG IMMEDIATE RELEASE TABLET    Take 1 tablet (5 mg total) by mouth every 6 (six) hours as needed for severe pain.   PANTOPRAZOLE (PROTONIX) 40 MG TABLET    Take 1 tablet (40 mg total) by mouth 2 (two) times daily. For 12 weeks   PREDNISONE (DELTASONE) 50 MG TABLET    TAKE 1 TABLET BY MOUTH EVERY DAY WITH BREAKFAST FOR 5 DAYS STARTING THE DAY AFTER EACH CHEMOTHERAPY   TENOFOVIR ALAFENAMIDE FUMARATE (VEMLIDY) 25 MG TABS    Take 1 tablet (25 mg total) by mouth daily.  Modified Medications   No medications on file  Discontinued Medications   No medications on file    Subjective: 72 year old male with history of low-grade myelodysplastic syndrome found to have generalized lymphadenopathy from high-grade large B-cell  lymphoma started on R-CHOP on 6/10 presents due to heme-onc request of ID involvement.  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  Noted Rituxan can increase chances of hep B reactivation.  He was started on tenofovir while on rituximab.  Noted to be moderate risk given hep B surface antigen negative, core antibody positive receiving anti-CD20 medication.  Recommend continued treatment concurrently while on Pranau suppressive therapy.  Today 10/29/22: PT presents with family. Notes last dose of TAF was on Sunday. His pharmacy did not have it, needs it from specialty pharmacy.   Today 12/3: Doing well no new complaints.  History of Present Illness            Review of Systems: Review  of Systems  All other systems reviewed and are negative.   Past Medical History:  Diagnosis Date   Cancer [facility])    prostate   Follicular lymphoma (HCC) 2024   History of pulmonary embolism    Hypertension    Myelodysplastic syndrome (HCC)    PAF (paroxysmal atrial fibrillation) (HCC)    Recurrent UTI     Social History   Tobacco Use   Smoking status: Never   Smokeless tobacco: Never  Vaping Use   Vaping status: Never Used  Substance Use Topics   Alcohol use: Yes    Comment: at least one 40 oz beer daily   Drug use: Never    Family History  Problem Relation Age of Onset   Stroke Father     Allergies  Allergen Reactions   Amitriptyline Other (See Comments)    Nightmares, Night sweats, Migraines    Amlodipine Swelling    BILATERAL FEET TO ANKLES   Neurontin [Gabapentin] Other (See Comments)    Nightmares; Night sweats; Headache    Health Maintenance  Topic Date Due   COVID-19 Vaccine (1) Never done   Pneumonia Vaccine 24+ Years old (1 of 2 - PCV) Never done   DTaP/Tdap/Td (1 - Tdap) Never done   Zoster Vaccines- Shingrix (1 of 2) Never done   Colonoscopy  Never done   INFLUENZA VACCINE  Never done   Hepatitis C Screening  Completed   HPV VACCINES  Aged Out    Objective:  There were no vitals filed for this visit. There is no height or weight on file to calculate BMI.  Physical Exam Constitutional:      General: He is not in acute distress.    Appearance: He is normal weight. He is not toxic-appearing.  HENT:     Head: Normocephalic and atraumatic.     Right Ear: External ear normal.     Left Ear: External ear normal.     Nose: No congestion or rhinorrhea.     Mouth/Throat:     Mouth: Mucous membranes are moist.     Pharynx: Oropharynx is clear.  Eyes:     Extraocular Movements: Extraocular movements intact.     Conjunctiva/sclera: Conjunctivae normal.     Pupils: Pupils are equal, round, and reactive to light.  Cardiovascular:     Rate and  Rhythm: Normal rate and regular rhythm.     Heart sounds: No murmur heard.    No friction rub. No gallop.  Pulmonary:     Effort: Pulmonary effort is normal.     Breath sounds: Normal breath sounds.  Abdominal:     General: Abdomen is flat. Bowel sounds are normal.     Palpations: Abdomen is soft.  Musculoskeletal:  General: No swelling. Normal range of motion.     Cervical back: Normal range of motion and neck supple.  Skin:    General: Skin is warm and dry.  Neurological:     General: No focal deficit present.     Mental Status: He is oriented to person, place, and time.  Psychiatric:        Mood and Affect: Mood normal.    Physical Exam          Lab Results Lab Results  Component Value Date   WBC 5.2 01/15/2023   HGB 13.9 01/15/2023   HCT 41.2 01/15/2023   MCV 88.2 01/15/2023   PLT 164 01/15/2023    Lab Results  Component Value Date   CREATININE 0.84 01/15/2023   BUN 12 01/15/2023   NA 141 01/15/2023   K 3.6 01/15/2023   CL 107 01/15/2023   CO2 30 01/15/2023    Lab Results  Component Value Date   ALT 46 (H) 01/15/2023   AST 38 01/15/2023   ALKPHOS 100 01/15/2023   BILITOT 0.5 01/15/2023    No results found for: "CHOL", "HDL", "LDLCALC", "LDLDIRECT", "TRIG", "CHOLHDL" No results found for: "LABRPR", "RPRTITER" No results found for: "HIV1RNAQUANT", "HIV1RNAVL", "CD4TABS"   Problem List Items Addressed This Visit   None  =  Assessment/Plan #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 ID was engaged (sAG and SAB negative, HBV DNA neg). Started TAF on 08/21/22, continue x 12 months after stopping anti-CD20, since lag in recovery of B cell function -tolerating TAF, stable labs from last visit, VL ND, Ag negative -Labs today: HepB sAG, cbc, cmp, HBV DNA(ND on 08/05/22) - F/U in 3 months(can move appt around depending on oversees trip)       Danelle Earthly, MD [facility] for Infectious Disease [facility] 01/28/2023, 6:42 AM    I have personally spent 42 minutes involved in face-to-face and non-face-to-face activities for this patient on the day of the visit. Professional time spent includes the following activities: Preparing to see the patient (review of tests), Obtaining and/or reviewing separately obtained history (admission/discharge record), Performing a medically appropriate examination and/or evaluation , Ordering medications/tests/procedures, referring and communicating with other health care professionals, Documenting clinical information in the EMR, Independently interpreting results (not separately reported), Communicating results to the patient/family/caregiver, Counseling and educating the patient/family/caregiver and Care coordination (not separately reported).  e

## 2023-01-29 ENCOUNTER — Encounter: Payer: Self-pay | Admitting: Hematology

## 2023-01-30 ENCOUNTER — Encounter: Payer: Self-pay | Admitting: Hematology

## 2023-01-30 LAB — COMPLETE METABOLIC PANEL WITH GFR
AG Ratio: 1.9 (calc) (ref 1.0–2.5)
ALT: 40 U/L (ref 9–46)
AST: 33 U/L (ref 10–35)
Albumin: 4.2 g/dL (ref 3.6–5.1)
Alkaline phosphatase (APISO): 104 U/L (ref 35–144)
BUN: 15 mg/dL (ref 7–25)
CO2: 26 mmol/L (ref 20–32)
Calcium: 9 mg/dL (ref 8.6–10.3)
Chloride: 107 mmol/L (ref 98–110)
Creat: 0.99 mg/dL (ref 0.70–1.28)
Globulin: 2.2 g/dL (ref 1.9–3.7)
Glucose, Bld: 72 mg/dL (ref 65–99)
Potassium: 3.6 mmol/L (ref 3.5–5.3)
Sodium: 145 mmol/L (ref 135–146)
Total Bilirubin: 0.6 mg/dL (ref 0.2–1.2)
Total Protein: 6.4 g/dL (ref 6.1–8.1)
eGFR: 82 mL/min/{1.73_m2} (ref >=60)

## 2023-01-30 LAB — HEPATITIS B DNA, ULTRAQUANTITATIVE, PCR
Hepatitis B DNA: NOT DETECTED [IU]/mL
Hepatitis B virus DNA: NOT DETECTED {Log_IU}/mL

## 2023-01-30 LAB — CBC WITH DIFFERENTIAL/PLATELET
Absolute Lymphocytes: 1694 {cells}/uL (ref 850–3900)
Absolute Monocytes: 600 {cells}/uL (ref 200–950)
Basophils Absolute: 19 {cells}/uL (ref 0–200)
Basophils Relative: 0.4 %
Eosinophils Absolute: 19 {cells}/uL (ref 15–500)
Eosinophils Relative: 0.4 %
HCT: 41.6 % (ref 38.5–50.0)
Hemoglobin: 13.8 g/dL (ref 13.2–17.1)
MCH: 30.1 pg (ref 27.0–33.0)
MCHC: 33.2 g/dL (ref 32.0–36.0)
MCV: 90.8 fL (ref 80.0–100.0)
MPV: 9 fL (ref 7.5–12.5)
Monocytes Relative: 12.5 %
Neutro Abs: 2467 {cells}/uL (ref 1500–7800)
Neutrophils Relative %: 51.4 %
Platelets: 176 10*3/uL (ref 140–400)
RBC: 4.58 10*6/uL (ref 4.20–5.80)
RDW: 14 % (ref 11.0–15.0)
Total Lymphocyte: 35.3 %
WBC: 4.8 10*3/uL (ref 3.8–10.8)

## 2023-01-30 LAB — HEPATITIS B SURFACE ANTIGEN: Hepatitis B Surface Ag: NONREACTIVE

## 2023-02-28 ENCOUNTER — Other Ambulatory Visit: Payer: Self-pay

## 2023-03-06 ENCOUNTER — Emergency Department (HOSPITAL_COMMUNITY): Payer: 59

## 2023-03-06 ENCOUNTER — Inpatient Hospital Stay (HOSPITAL_COMMUNITY)
Admission: EM | Admit: 2023-03-06 | Discharge: 2023-03-17 | DRG: 025 | Disposition: A | Discharge status: Home / Self Care | Payer: 59 | Attending: Neurosurgery | Admitting: Neurosurgery

## 2023-03-06 ENCOUNTER — Other Ambulatory Visit: Payer: Self-pay

## 2023-03-06 ENCOUNTER — Encounter (HOSPITAL_COMMUNITY): Payer: Self-pay | Admitting: Emergency Medicine

## 2023-03-06 DIAGNOSIS — G936 Cerebral edema: Secondary | ICD-10-CM | POA: Diagnosis present

## 2023-03-06 DIAGNOSIS — D469 Myelodysplastic syndrome, unspecified: Secondary | ICD-10-CM | POA: Insufficient documentation

## 2023-03-06 DIAGNOSIS — Z8711 Personal history of peptic ulcer disease: Secondary | ICD-10-CM

## 2023-03-06 DIAGNOSIS — Z9221 Personal history of antineoplastic chemotherapy: Secondary | ICD-10-CM

## 2023-03-06 DIAGNOSIS — Z888 Allergy status to other drugs, medicaments and biological substances status: Secondary | ICD-10-CM

## 2023-03-06 DIAGNOSIS — Z86711 Personal history of pulmonary embolism: Secondary | ICD-10-CM

## 2023-03-06 DIAGNOSIS — K5900 Constipation, unspecified: Secondary | ICD-10-CM | POA: Diagnosis present

## 2023-03-06 DIAGNOSIS — K279 Peptic ulcer, site unspecified, unspecified as acute or chronic, without hemorrhage or perforation: Secondary | ICD-10-CM | POA: Insufficient documentation

## 2023-03-06 DIAGNOSIS — Z7952 Long term (current) use of systemic steroids: Secondary | ICD-10-CM

## 2023-03-06 DIAGNOSIS — B1910 Unspecified viral hepatitis B without hepatic coma: Secondary | ICD-10-CM | POA: Diagnosis present

## 2023-03-06 DIAGNOSIS — G935 Compression of brain: Secondary | ICD-10-CM | POA: Diagnosis present

## 2023-03-06 DIAGNOSIS — C7931 Secondary malignant neoplasm of brain: Secondary | ICD-10-CM | POA: Diagnosis not present

## 2023-03-06 DIAGNOSIS — Z9079 Acquired absence of other genital organ(s): Secondary | ICD-10-CM

## 2023-03-06 DIAGNOSIS — Z6830 Body mass index (BMI) 30.0-30.9, adult: Secondary | ICD-10-CM

## 2023-03-06 DIAGNOSIS — G9389 Other specified disorders of brain: Principal | ICD-10-CM

## 2023-03-06 DIAGNOSIS — Z823 Family history of stroke: Secondary | ICD-10-CM

## 2023-03-06 DIAGNOSIS — S199XXA Unspecified injury of neck, initial encounter: Secondary | ICD-10-CM | POA: Diagnosis not present

## 2023-03-06 DIAGNOSIS — R001 Bradycardia, unspecified: Secondary | ICD-10-CM

## 2023-03-06 DIAGNOSIS — I10 Essential (primary) hypertension: Secondary | ICD-10-CM | POA: Diagnosis present

## 2023-03-06 DIAGNOSIS — S0990XA Unspecified injury of head, initial encounter: Secondary | ICD-10-CM | POA: Diagnosis not present

## 2023-03-06 DIAGNOSIS — E669 Obesity, unspecified: Secondary | ICD-10-CM | POA: Diagnosis present

## 2023-03-06 DIAGNOSIS — G8194 Hemiplegia, unspecified affecting left nondominant side: Secondary | ICD-10-CM | POA: Diagnosis present

## 2023-03-06 DIAGNOSIS — F419 Anxiety disorder, unspecified: Secondary | ICD-10-CM | POA: Diagnosis present

## 2023-03-06 DIAGNOSIS — D496 Neoplasm of unspecified behavior of brain: Secondary | ICD-10-CM | POA: Diagnosis not present

## 2023-03-06 DIAGNOSIS — B181 Chronic viral hepatitis B without delta-agent: Secondary | ICD-10-CM | POA: Diagnosis present

## 2023-03-06 DIAGNOSIS — Z79899 Other long term (current) drug therapy: Secondary | ICD-10-CM

## 2023-03-06 DIAGNOSIS — C8338 Diffuse large B-cell lymphoma, lymph nodes of multiple sites: Secondary | ICD-10-CM | POA: Diagnosis present

## 2023-03-06 DIAGNOSIS — R531 Weakness: Secondary | ICD-10-CM | POA: Diagnosis not present

## 2023-03-06 DIAGNOSIS — D46Z Other myelodysplastic syndromes: Secondary | ICD-10-CM | POA: Diagnosis present

## 2023-03-06 DIAGNOSIS — I480 Paroxysmal atrial fibrillation: Secondary | ICD-10-CM | POA: Diagnosis present

## 2023-03-06 DIAGNOSIS — Z8546 Personal history of malignant neoplasm of prostate: Secondary | ICD-10-CM

## 2023-03-06 LAB — URINALYSIS, ROUTINE W REFLEX MICROSCOPIC
Bilirubin Urine: NEGATIVE
Glucose, UA: NEGATIVE mg/dL
Hgb urine dipstick: NEGATIVE
Ketones, ur: NEGATIVE mg/dL
Leukocytes,Ua: NEGATIVE
Nitrite: NEGATIVE
Protein, ur: NEGATIVE mg/dL
Specific Gravity, Urine: 1.009 (ref 1.005–1.030)
pH: 5 (ref 5.0–8.0)

## 2023-03-06 LAB — BASIC METABOLIC PANEL
Anion gap: 10 (ref 5–15)
BUN: 9 mg/dL (ref 8–23)
CO2: 25 mmol/L (ref 22–32)
Calcium: 9.1 mg/dL (ref 8.9–10.3)
Chloride: 105 mmol/L (ref 98–111)
Creatinine, Ser: 0.88 mg/dL (ref 0.61–1.24)
GFR, Estimated: >60 mL/min (ref >60)
Glucose, Bld: 116 mg/dL — ABNORMAL HIGH (ref 70–99)
Potassium: 3.6 mmol/L (ref 3.5–5.1)
Sodium: 140 mmol/L (ref 135–145)

## 2023-03-06 LAB — CBC
HCT: 42.7 % (ref 39.0–52.0)
Hemoglobin: 14.6 g/dL (ref 13.0–17.0)
MCH: 30.9 pg (ref 26.0–34.0)
MCHC: 34.2 g/dL (ref 30.0–36.0)
MCV: 90.5 fL (ref 80.0–100.0)
Platelets: 160 10*3/uL (ref 150–400)
RBC: 4.72 MIL/uL (ref 4.22–5.81)
RDW: 12.3 % (ref 11.5–15.5)
WBC: 4.6 10*3/uL (ref 4.0–10.5)
nRBC: 0 % (ref 0.0–0.2)

## 2023-03-06 NOTE — ED Provider Notes (Signed)
 [place] EMERGENCY DEPARTMENT AT [facility] Provider Note   CSN: [id number] Arrival date & time: 03/06/23  1937     History  Chief Complaint  Patient presents with   Weakness    Tyler [name] is a 73 y.o. male.  The history is provided by the patient and medical records.  Weakness  73 year old male with history of hypertension, paroxysmal A-fib not currently on anticoagulation, B-cell lymphoma, myelodysplastic syndrome, presenting to the ED with left-sided weakness.  Patient reports this has been ongoing for about 3 days now, seems to be worsening.  States when walking his left leg seems to be giving out.  He is also having some weakness in the left hand causing him to drop items frequently.  Son at bedside reports he has been quite forgetful and intermittently confused-- ie could not recall why he was at the hospital or what this place was.  Has also complained of a headache and some pain behind the left eye.  He has not had any fever or chills.  Has history of B-cell lymphoma, finished chemo a few months ago and follow-up scans were clear per their report.  Home Medications Prior to Admission medications   Medication Sig Start Date End Date Taking? Authorizing Provider  B Complex Vitamins (VITAMIN B COMPLEX ) TABS Take 1 tablet by mouth daily. 12/21/21   Amin, Ankit C, MD  cholecalciferol  (VITAMIN D3) 25 MCG (1000 UNIT) tablet Take 2 tablets (2,000 Units total) by mouth daily. 01/21/22   Onesimo Emaline Brink, MD  clotrimazole -betamethasone  (LOTRISONE ) cream APPLY TO AFFECTED AREA TWICE A DAY 12/18/22   Kale, Gautam Kishore, MD  diclofenac  Sodium (VOLTAREN ) 1 % GEL APPLY 4 G TOPICALLY 4 (FOUR) TIMES DAILY. APPLY TO RIGHT LATERAL HIP AND THIGH DOWN TO KNEE Patient taking differently: Apply 4 g topically daily as needed (For pain). 06/12/22   Rosalynn Camie CROME, MD  folic acid  (FOLVITE ) 1 MG tablet Take 1 mg by mouth daily. 03/27/22   [provider]  furosemide  (LASIX ) 20  MG tablet Take 1 tablet (20 mg total) by mouth daily. 12/30/22   Barbaraann Darryle Ned, MD  KLOR-CON  M20 20 MEQ tablet TAKE 1 TABLET BY MOUTH TWICE A DAY 09/12/22   Onesimo Emaline Brink, MD  lidocaine -prilocaine  (EMLA ) cream Apply to affected area once 09/18/22   Thayil, Irene T, PA-C  Menthol-Methyl Salicylate (MUSCLE RUB) 10-15 % CREA Apply 1 Application topically daily as needed for muscle pain.    [provider]  metoprolol  succinate (TOPROL  XL) 25 MG 24 hr tablet Take 1 tablet (25 mg total) by mouth daily. 12/30/22   O'NealDarryle Ned, MD  Multiple Vitamin (MULTIVITAMIN WITH MINERALS) TABS tablet Take 1 tablet by mouth daily. 12/21/21   Amin, Ankit C, MD  ondansetron  (ZOFRAN ) 8 MG tablet Take 1 tablet (8 mg total) by mouth every 8 (eight) hours as needed for nausea or vomiting. Start on the third day after cyclophosphamide  chemotherapy. 07/26/22   Onesimo Emaline Brink, MD  oxyCODONE  (OXY IR/ROXICODONE ) 5 MG immediate release tablet Take 1 tablet (5 mg total) by mouth every 6 (six) hours as needed for severe pain. 08/28/22   Ghimire, Donalda HERO, MD  pantoprazole  (PROTONIX ) 40 MG tablet Take 1 tablet (40 mg total) by mouth 2 (two) times daily. For 12 weeks 08/28/22 11/08/22  Raenelle Donalda HERO, MD  predniSONE  (DELTASONE ) 50 MG tablet TAKE 1 TABLET BY MOUTH EVERY DAY WITH BREAKFAST FOR 5 DAYS STARTING THE DAY AFTER EACH CHEMOTHERAPY 11/16/22  Onesimo Emaline Brink, MD  Tenofovir  Alafenamide Fumarate (VEMLIDY ) 25 MG TABS Take 1 tablet (25 mg total) by mouth daily. 10/29/22   Dennise Kingsley, MD      Allergies    Amitriptyline , Amlodipine, and Neurontin  [gabapentin ]    Review of Systems   Review of Systems  Neurological:  Positive for weakness.  All other systems reviewed and are negative.   Physical Exam Updated Vital Signs BP (!) 143/106 (BP Location: Left Arm)   Pulse 60   Temp 98.3 F (36.8 C)   Resp 18   SpO2 99%   Physical Exam Vitals and nursing note reviewed.  Constitutional:       Appearance: He is well-developed.  HENT:     Head: Normocephalic and atraumatic.  Eyes:     Conjunctiva/sclera: Conjunctivae normal.     Pupils: Pupils are equal, round, and reactive to light.  Cardiovascular:     Rate and Rhythm: Normal rate and regular rhythm.     Heart sounds: Normal heart sounds.  Pulmonary:     Effort: Pulmonary effort is normal.     Breath sounds: Normal breath sounds.  Abdominal:     General: Bowel sounds are normal.     Palpations: Abdomen is soft.  Musculoskeletal:        General: Normal range of motion.     Cervical back: Normal range of motion.  Skin:    General: Skin is warm and dry.  Neurological:     Mental Status: He is alert and oriented to person, place, and time.     Comments: AAO, able to answer questions and follow commands, left upper and lower extremities are 3/5 compared with right 5/5     ED Results / Procedures / Treatments   Labs (all labs ordered are listed, but only abnormal results are displayed) Labs Reviewed  BASIC METABOLIC PANEL - Abnormal; Notable for the following components:      Result Value   Glucose, Bld 116 (*)    All other components within normal limits  URINALYSIS, ROUTINE W REFLEX MICROSCOPIC - Abnormal; Notable for the following components:   APPearance HAZY (*)    All other components within normal limits  CBC    EKG None  Radiology MR Brain W and Wo Contrast Result Date: 03/07/2023 CLINICAL DATA:  Initial evaluation for brain/CNS neoplasm. EXAM: MRI HEAD WITHOUT AND WITH CONTRAST TECHNIQUE: Multiplanar, multiecho pulse sequences of the brain and surrounding structures were obtained without and with intravenous contrast. CONTRAST:  8mL GADAVIST  GADOBUTROL  1 MMOL/ML IV SOLN COMPARISON:  CT from earlier the same day as well as previous MRI from 07/30/2022. FINDINGS: Brain: Age-related cerebral atrophy. Patchy T2/FLAIR hyperintensity involving the periventricular and deep white matter both cerebral  hemispheres, most likely related to chronic microvascular ischemic disease. No acute or subacute infarct. Heterogeneous enhancing mass positioned at the right parietal lobe measures 4.1 x 3.1 x 3.1 cm (series 17, image 45). Associated susceptibility artifact consistent with necrosis and/or blood products. Surrounding T2/FLAIR signal intensity throughout the adjacent right cerebral hemisphere, consistent with vasogenic edema. Effacement of the posterior right lateral ventricle. Asymmetric dilatation of the right temporal horn consistent with a degree of ventricular trapping. No visible transependymal flow of CSF. Associated 4 mm of right-to-left shift. No other mass lesion or abnormal enhancement. No other acute or chronic intracranial blood products. Pituitary gland and suprasellar region within normal limits. Vascular: Major intracranial vascular flow voids are maintained. Skull and upper cervical spine: Craniocervical junction within normal  limits. Bone marrow signal intensity normal. No scalp soft tissue abnormality. Sinuses/Orbits: Globes and orbital soft tissues within normal limits. Mild mucosal thickening present about the ethmoidal air cells and maxillary sinuses. No significant mastoid effusion. Other: None. IMPRESSION: 1. 4.1 x 3.1 x 3.1 cm heterogeneous enhancing mass positioned at the right parietal lobe with surrounding vasogenic edema and 4 mm of right-to-left shift. Differential considerations include a solitary intracranial metastasis versus primary CNS neoplasm. 2. Underlying age-related cerebral atrophy with chronic small vessel ischemic disease. Electronically Signed   By: Morene Hoard M.D.   On: 03/07/2023 04:19   CT Head Wo Contrast Result Date: 03/06/2023 CLINICAL DATA:  Blunt polytrauma, weakness EXAM: CT HEAD WITHOUT CONTRAST CT CERVICAL SPINE WITHOUT CONTRAST TECHNIQUE: Multidetector CT imaging of the head and cervical spine was performed following the standard protocol without  intravenous contrast. Multiplanar CT image reconstructions of the cervical spine were also generated. RADIATION DOSE REDUCTION: This exam was performed according to the departmental dose-optimization program which includes automated exposure control, adjustment of the mA and/or kV according to patient size and/or use of iterative reconstruction technique. COMPARISON:  MRI head 07/30/2022 and CT head 07/29/2022 FINDINGS: CT HEAD FINDINGS Brain: Hypoattenuation within the periventricular white matter of the right parietal and occipital lobes is favored due to edema. This appears new since PET/CT 12/27/2022. Possible 2.9 x 3.1 cm circumscribed mass in the posterior right parietal lobe (series 2/image 18). This may be the cause of the edema. MRI is recommended for further evaluation. There is mass effect on the atrium and occipital horn of the right lateral ventricle. 4 mm of leftward midline shift at the septum pellucidum. The basal cisterns are patent. No hydrocephalus. No extra-axial fluid collection. Vascular: No hyperdense vessel or unexpected calcification. Skull: No acute fracture or destructive osseous lesion. Mucosal thickening in the maxillary sinuses. Sinuses/Orbits: No acute finding. Other: None. CT CERVICAL SPINE FINDINGS Alignment: No evidence of traumatic listhesis. Skull base and vertebrae: No acute fracture. No primary bone lesion or focal pathologic process. Soft tissues and spinal canal: No prevertebral fluid or swelling. No visible canal hematoma. Disc levels: Multilevel spondylosis, disc space height loss, and degenerative endplate changes greatest at C5-C6 where it is advanced. No severe spinal canal narrowing. Upper chest: No acute abnormality. Other: None. IMPRESSION: 1. Findings suspicious for metastasis in the posterior right parietal lobe with surrounding vasogenic edema. 4 mm of leftward midline shift. MRI with and without IV contrast is recommended for further evaluation. 2. No acute  fracture in the cervical spine. Electronically Signed   By: Norman Gatlin M.D.   On: 03/06/2023 20:44   CT Cervical Spine Wo Contrast Result Date: 03/06/2023 CLINICAL DATA:  Blunt polytrauma, weakness EXAM: CT HEAD WITHOUT CONTRAST CT CERVICAL SPINE WITHOUT CONTRAST TECHNIQUE: Multidetector CT imaging of the head and cervical spine was performed following the standard protocol without intravenous contrast. Multiplanar CT image reconstructions of the cervical spine were also generated. RADIATION DOSE REDUCTION: This exam was performed according to the departmental dose-optimization program which includes automated exposure control, adjustment of the mA and/or kV according to patient size and/or use of iterative reconstruction technique. COMPARISON:  MRI head 07/30/2022 and CT head 07/29/2022 FINDINGS: CT HEAD FINDINGS Brain: Hypoattenuation within the periventricular white matter of the right parietal and occipital lobes is favored due to edema. This appears new since PET/CT 12/27/2022. Possible 2.9 x 3.1 cm circumscribed mass in the posterior right parietal lobe (series 2/image 18). This may be the cause of the edema.  MRI is recommended for further evaluation. There is mass effect on the atrium and occipital horn of the right lateral ventricle. 4 mm of leftward midline shift at the septum pellucidum. The basal cisterns are patent. No hydrocephalus. No extra-axial fluid collection. Vascular: No hyperdense vessel or unexpected calcification. Skull: No acute fracture or destructive osseous lesion. Mucosal thickening in the maxillary sinuses. Sinuses/Orbits: No acute finding. Other: None. CT CERVICAL SPINE FINDINGS Alignment: No evidence of traumatic listhesis. Skull base and vertebrae: No acute fracture. No primary bone lesion or focal pathologic process. Soft tissues and spinal canal: No prevertebral fluid or swelling. No visible canal hematoma. Disc levels: Multilevel spondylosis, disc space height loss, and  degenerative endplate changes greatest at C5-C6 where it is advanced. No severe spinal canal narrowing. Upper chest: No acute abnormality. Other: None. IMPRESSION: 1. Findings suspicious for metastasis in the posterior right parietal lobe with surrounding vasogenic edema. 4 mm of leftward midline shift. MRI with and without IV contrast is recommended for further evaluation. 2. No acute fracture in the cervical spine. Electronically Signed   By: Norman Gatlin M.D.   On: 03/06/2023 20:44    Procedures Procedures    Medications Ordered in ED Medications - No data to display  ED Course/ Medical Decision Making/ A&P                                 Medical Decision Making Amount and/or Complexity of Data Reviewed Labs: ordered. Radiology: ordered and independent interpretation performed. ECG/medicine tests: ordered and independent interpretation performed.  Risk Prescription drug management. Decision regarding hospitalization.   74 male presenting to the ED with left-sided weakness for the past 3 days, worse today.  States when walking he feels like his leg is giving out and he is dropping items when trying to hold them in his left hand.  Son reports he has been intermittently confused and forgetful which is atypical for him.  He is awake, alert, oriented.  He is able to answer questions and follow commands.  He does have 3/5 strength on left upper and lower extremities, normal strength on right.  Ambulates with walker at baseline.  CT head and C-spine obtained from triage with unfortunate findings of likely new mass.  MRI recommended for further eval.  Lab work is grossly reassuring.  MRI with right sided mass, does have some vasogenic edema and about 4 mm of right-to-left shift.  Question if metastatic disease vs primarily brain neoplasm.  MRI June 2024 without these findings.  Patient exam is overall unchanged.  He remains awake and alert.  Results discussed with patient and son at bedside,  they both voiced understanding.  I suspect he will need admission given new findings and mobility issues.  4:53 AM Discussed with neurosurgery, NP Meghan-- recommends to start 6mg  decadron  Q6H, admit to medicine for possible metastatic work-up.  They will consult in AM.  Spoke with hospitalist, Dr. Cleatus-- will admit for ongoing care.  Final Clinical Impression(s) / ED Diagnoses Final diagnoses:  Brain mass    Rx / DC Orders ED Discharge Orders     None         Jarold Olam HERO, PA-C 03/07/23 [id number]    Bari Charmaine FALCON, MD 03/09/23 [phone]

## 2023-03-06 NOTE — ED Triage Notes (Signed)
 Increasing weakness to left arm and leg x 1 week.  Sometimes drops objects when  holding with left hand. Today had a fall d/t left leg giving way.  Now having headache and neck pain but states that was prior to the fall today.  No known injury from the fall itself.

## 2023-03-07 ENCOUNTER — Emergency Department (HOSPITAL_COMMUNITY): Payer: 59

## 2023-03-07 ENCOUNTER — Inpatient Hospital Stay (HOSPITAL_COMMUNITY): Payer: 59

## 2023-03-07 ENCOUNTER — Other Ambulatory Visit: Payer: Self-pay | Admitting: Neurosurgery

## 2023-03-07 DIAGNOSIS — G936 Cerebral edema: Secondary | ICD-10-CM | POA: Diagnosis not present

## 2023-03-07 DIAGNOSIS — G319 Degenerative disease of nervous system, unspecified: Secondary | ICD-10-CM | POA: Diagnosis not present

## 2023-03-07 DIAGNOSIS — Z6830 Body mass index (BMI) 30.0-30.9, adult: Secondary | ICD-10-CM | POA: Diagnosis not present

## 2023-03-07 DIAGNOSIS — G935 Compression of brain: Secondary | ICD-10-CM | POA: Diagnosis not present

## 2023-03-07 DIAGNOSIS — Z9079 Acquired absence of other genital organ(s): Secondary | ICD-10-CM

## 2023-03-07 DIAGNOSIS — C7931 Secondary malignant neoplasm of brain: Secondary | ICD-10-CM | POA: Diagnosis not present

## 2023-03-07 DIAGNOSIS — Z823 Family history of stroke: Secondary | ICD-10-CM | POA: Diagnosis not present

## 2023-03-07 DIAGNOSIS — E669 Obesity, unspecified: Secondary | ICD-10-CM | POA: Diagnosis not present

## 2023-03-07 DIAGNOSIS — G8194 Hemiplegia, unspecified affecting left nondominant side: Secondary | ICD-10-CM | POA: Diagnosis not present

## 2023-03-07 DIAGNOSIS — F419 Anxiety disorder, unspecified: Secondary | ICD-10-CM | POA: Diagnosis not present

## 2023-03-07 DIAGNOSIS — B1910 Unspecified viral hepatitis B without hepatic coma: Secondary | ICD-10-CM | POA: Diagnosis not present

## 2023-03-07 DIAGNOSIS — C8338 Diffuse large B-cell lymphoma, lymph nodes of multiple sites: Secondary | ICD-10-CM

## 2023-03-07 DIAGNOSIS — Z86711 Personal history of pulmonary embolism: Secondary | ICD-10-CM | POA: Diagnosis not present

## 2023-03-07 DIAGNOSIS — N281 Cyst of kidney, acquired: Secondary | ICD-10-CM | POA: Diagnosis not present

## 2023-03-07 DIAGNOSIS — Z8711 Personal history of peptic ulcer disease: Secondary | ICD-10-CM | POA: Diagnosis not present

## 2023-03-07 DIAGNOSIS — Z8546 Personal history of malignant neoplasm of prostate: Secondary | ICD-10-CM

## 2023-03-07 DIAGNOSIS — C8330 Diffuse large B-cell lymphoma, unspecified site: Secondary | ICD-10-CM | POA: Diagnosis not present

## 2023-03-07 DIAGNOSIS — Z7952 Long term (current) use of systemic steroids: Secondary | ICD-10-CM | POA: Diagnosis not present

## 2023-03-07 DIAGNOSIS — Z9221 Personal history of antineoplastic chemotherapy: Secondary | ICD-10-CM | POA: Diagnosis not present

## 2023-03-07 DIAGNOSIS — D496 Neoplasm of unspecified behavior of brain: Secondary | ICD-10-CM | POA: Diagnosis not present

## 2023-03-07 DIAGNOSIS — B181 Chronic viral hepatitis B without delta-agent: Secondary | ICD-10-CM | POA: Diagnosis not present

## 2023-03-07 DIAGNOSIS — K279 Peptic ulcer, site unspecified, unspecified as acute or chronic, without hemorrhage or perforation: Secondary | ICD-10-CM | POA: Insufficient documentation

## 2023-03-07 DIAGNOSIS — I6782 Cerebral ischemia: Secondary | ICD-10-CM | POA: Diagnosis not present

## 2023-03-07 DIAGNOSIS — D46Z Other myelodysplastic syndromes: Secondary | ICD-10-CM | POA: Diagnosis not present

## 2023-03-07 DIAGNOSIS — G9389 Other specified disorders of brain: Secondary | ICD-10-CM | POA: Diagnosis not present

## 2023-03-07 DIAGNOSIS — Z888 Allergy status to other drugs, medicaments and biological substances status: Secondary | ICD-10-CM | POA: Diagnosis not present

## 2023-03-07 DIAGNOSIS — I480 Paroxysmal atrial fibrillation: Secondary | ICD-10-CM | POA: Diagnosis not present

## 2023-03-07 DIAGNOSIS — D469 Myelodysplastic syndrome, unspecified: Secondary | ICD-10-CM | POA: Insufficient documentation

## 2023-03-07 DIAGNOSIS — K5900 Constipation, unspecified: Secondary | ICD-10-CM | POA: Diagnosis not present

## 2023-03-07 DIAGNOSIS — G939 Disorder of brain, unspecified: Secondary | ICD-10-CM | POA: Diagnosis not present

## 2023-03-07 DIAGNOSIS — I482 Chronic atrial fibrillation, unspecified: Secondary | ICD-10-CM | POA: Diagnosis not present

## 2023-03-07 DIAGNOSIS — R001 Bradycardia, unspecified: Secondary | ICD-10-CM

## 2023-03-07 DIAGNOSIS — I10 Essential (primary) hypertension: Secondary | ICD-10-CM | POA: Diagnosis not present

## 2023-03-07 DIAGNOSIS — K4020 Bilateral inguinal hernia, without obstruction or gangrene, not specified as recurrent: Secondary | ICD-10-CM | POA: Diagnosis not present

## 2023-03-07 DIAGNOSIS — K5730 Diverticulosis of large intestine without perforation or abscess without bleeding: Secondary | ICD-10-CM | POA: Diagnosis not present

## 2023-03-07 DIAGNOSIS — Z79899 Other long term (current) drug therapy: Secondary | ICD-10-CM | POA: Diagnosis not present

## 2023-03-07 MED ORDER — DEXAMETHASONE SODIUM PHOSPHATE 10 MG/ML IJ SOLN
8.0000 mg | Freq: Two times a day (BID) | INTRAMUSCULAR | Status: DC
  Administered 2023-03-07 – 2023-03-12 (×12): 8 mg via INTRAVENOUS
  Filled 2023-03-07 (×5): qty 1
  Filled 2023-03-07 (×3): qty 0.8
  Filled 2023-03-07: qty 1
  Filled 2023-03-07 (×5): qty 0.8

## 2023-03-07 MED ORDER — ONDANSETRON HCL 4 MG PO TABS: 4.0000 mg | ORAL_TABLET | Freq: Four times a day (QID) | ORAL | Status: DC | PRN

## 2023-03-07 MED ORDER — IOHEXOL 350 MG/ML SOLN
75.0000 mL | Freq: Once | INTRAVENOUS | Status: AC | PRN
  Administered 2023-03-07: 75 mL via INTRAVENOUS

## 2023-03-07 MED ORDER — ACETAMINOPHEN 325 MG PO TABS
650.0000 mg | ORAL_TABLET | Freq: Four times a day (QID) | ORAL | Status: DC | PRN
  Administered 2023-03-07 – 2023-03-08 (×2): 650 mg via ORAL
  Filled 2023-03-07 (×2): qty 2

## 2023-03-07 MED ORDER — ONDANSETRON HCL 4 MG/2ML IJ SOLN: 4.0000 mg | Freq: Four times a day (QID) | INTRAMUSCULAR | Status: DC | PRN

## 2023-03-07 MED ORDER — TENOFOVIR ALAFENAMIDE FUMARATE 25 MG PO TABS
25.0000 mg | ORAL_TABLET | Freq: Every day | ORAL | Status: DC
  Administered 2023-03-07 – 2023-03-17 (×10): 25 mg via ORAL
  Filled 2023-03-07 (×11): qty 1

## 2023-03-07 MED ORDER — PANTOPRAZOLE SODIUM 40 MG PO TBEC
40.0000 mg | DELAYED_RELEASE_TABLET | Freq: Two times a day (BID) | ORAL | Status: DC
  Administered 2023-03-07 – 2023-03-17 (×20): 40 mg via ORAL
  Filled 2023-03-07 (×21): qty 1

## 2023-03-07 MED ORDER — GADOBUTROL 1 MMOL/ML IV SOLN
8.0000 mL | Freq: Once | INTRAVENOUS | Status: AC | PRN
  Administered 2023-03-07: 8 mL via INTRAVENOUS

## 2023-03-07 MED ORDER — ENOXAPARIN SODIUM 40 MG/0.4ML IJ SOSY
40.0000 mg | PREFILLED_SYRINGE | INTRAMUSCULAR | Status: DC
  Administered 2023-03-07 – 2023-03-08 (×2): 40 mg via SUBCUTANEOUS
  Filled 2023-03-07 (×2): qty 0.4

## 2023-03-07 MED ORDER — HALOPERIDOL 1 MG PO TABS
2.0000 mg | ORAL_TABLET | Freq: Four times a day (QID) | ORAL | Status: DC | PRN
  Filled 2023-03-07: qty 2

## 2023-03-07 MED ORDER — HYDROCODONE-ACETAMINOPHEN 5-325 MG PO TABS
1.0000 | ORAL_TABLET | ORAL | Status: DC | PRN
  Administered 2023-03-07 – 2023-03-16 (×2): 1 via ORAL
  Filled 2023-03-07: qty 1
  Filled 2023-03-07 (×2): qty 2

## 2023-03-07 MED ORDER — DEXAMETHASONE SODIUM PHOSPHATE 10 MG/ML IJ SOLN
6.0000 mg | Freq: Once | INTRAMUSCULAR | Status: AC
  Administered 2023-03-07: 6 mg via INTRAVENOUS
  Filled 2023-03-07: qty 1

## 2023-03-07 MED ORDER — METHOCARBAMOL 750 MG PO TABS
750.0000 mg | ORAL_TABLET | Freq: Three times a day (TID) | ORAL | Status: DC | PRN
  Administered 2023-03-07 – 2023-03-16 (×2): 750 mg via ORAL
  Filled 2023-03-07 (×2): qty 1

## 2023-03-07 MED ORDER — ACETAMINOPHEN 650 MG RE SUPP: 650.0000 mg | Freq: Four times a day (QID) | RECTAL | Status: DC | PRN

## 2023-03-07 NOTE — Assessment & Plan Note (Signed)
 On tenofovir Followed by ID outpatient

## 2023-03-07 NOTE — Progress Notes (Addendum)
 Tyler [name]   DOB:1950/06/29   FM#:[id number]      ASSESSMENT & PLAN:  1.  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 - CT of head done 03/06/2023 with findings suspicious for mets in the posterior right parietal lobe, MRI was recommended. - Subsequently MRI done 03/07/2023 shows heterogeneous enhancing mass position at the right parietal lobe with surrounding vasogenic edema.  Differential includes solitary intracranial mets versus primary CNS neoplasm. - Continue Decadron  dosing as ordered. -Neurosurgery consult appreciated-treatment options including doing nothing, radiation therapy, surgery discussed with patient.  Indeed spoke with patient today and he is aware of various options.  Reports that he wants to discuss it with his daughter who is a engineer, civil (consulting) and that he lives with her. - Patient last seen in outpatient oncology by Dr. Onesimo November 2024 and the plan was to monitor labs every 3 months and repeat CAT scan in 6 months. - Dr. Onesimo will make further treatment recommendations.  2.  Diffuse large B-cell lymphoma (DLBCL) -Initially diagnosed 07/25/2022. - Patient was started on chemotherapy  R-CHOP every 21 days beginning 08/06/2022. - Medical oncology/Dr. Onesimo following patient  3.  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  A-fib - Status post Eliquis .  If patient continues to have A-fib, would be reason to restart. - Follow closely with cardiology  5.  Prostate cancer - Diagnosed 2021  6.  Hep B antibody positive, chronic - On tenofovir  for hep B reactivation suppression in context of Rituxan  use.  Plan is to continue for 6 months after his last chemotherapy. - Follow ID closely    Code Status Full  Subjective:  Patient now admitted with complaints of 3-week history of headaches with left-sided  weakness progressively worse over 3 days. Patient is seen and assessed awake, alert and oriented x 3 laying supine in bed.  Very pleasant.  States he came from [place] in 2018.  Reports he was in his usual state of health until a few days ago when began to experience symptoms as described above.  He does now admit to the headaches being intermittent and dizziness intermittent.  Denies chest pain, nausea, vomiting, bleeding, or other acute complaints. No acute distress noted at this time.  Objective:  Vitals:   03/07/23 0400 03/07/23 0752  BP: 136/86 (!) 182/113  Pulse: (!) 54 62  Resp:  18  Temp: 97.7 F (36.5 C) 97.7 F (36.5 C)  SpO2: 100% 99%    No intake or output data in the 24 hours ending 03/07/23 1006   REVIEW OF SYSTEMS:   Constitutional: +Intermittent headaches +intermittent dizziness  Eyes: Denies blurriness of vision, double vision or watery eyes Ears, nose, mouth, throat, and face: Denies mucositis or sore throat Respiratory: Denies cough, dyspnea or wheezes Cardiovascular: Denies palpitation, chest discomfort or lower extremity swelling Gastrointestinal:  Denies nausea, heartburn or change in bowel habits Skin: Denies abnormal skin rashes Lymphatics: Denies new lymphadenopathy or easy bruising Neurological: Denies numbness, tingling or new weaknesses Behavioral/Psych: Mood is stable, no new changes  All other systems were reviewed with the patient and are negative.  PHYSICAL EXAMINATION: ECOG PERFORMANCE STATUS: 2 - Symptomatic, <50% confined to bed  Vitals:   03/07/23 0400 03/07/23 0752  BP: 136/86 (!) 182/113  Pulse: (!) 54 62  Resp:  18  Temp: 97.7 F (  36.5 C) 97.7 F (36.5 C)  SpO2: 100% 99%   There were no vitals filed for this visit.  GENERAL: alert, no distress and comfortable SKIN: skin color, texture, turgor are normal, no rashes or significant lesions EYES: normal, conjunctiva are pink and non-injected, sclera clear OROPHARYNX: no exudate,  no erythema and lips, buccal mucosa, and tongue normal  NECK: supple, thyroid normal size, non-tender, without nodularity LYMPH: no palpable lymphadenopathy in the cervical, axillary or inguinal LUNGS: clear to auscultation and percussion with normal breathing effort HEART: regular rate & rhythm and no murmurs and no lower extremity edema ABDOMEN: abdomen soft, non-tender and normal bowel sounds MUSCULOSKELETAL: no cyanosis of digits and no clubbing  PSYCH: alert & oriented x 3 with fluent speech NEURO: no focal motor/sensory deficits   All questions were answered. The patient knows to call the clinic with any problems, questions or concerns.   The total time spent in the appointment was 40 minutes encounter with patient including review of chart and various tests results, discussions about plan of care and coordination of care plan  Olam JINNY Brunner, NP 03/07/2023 10:06 AM    Labs Reviewed:  Lab Results  Component Value Date   WBC 4.6 03/06/2023   HGB 14.6 03/06/2023   HCT 42.7 03/06/2023   MCV 90.5 03/06/2023   PLT 160 03/06/2023   Recent Labs    08/11/22 0316 08/11/22 1544 08/12/22 0515 08/12/22 1400 08/20/22 0302 08/21/22 0334 11/15/22 0830 12/05/22 0801 01/15/23 1300 01/28/23 0840 03/06/23 2020  NA 134*   < > 137   < > 137   < > 141 141 141 145 140  K 3.7   < > 3.3*   < > 4.0   < > 3.4* 3.4* 3.6 3.6 3.6  CL 99   < > 101   < > 102   < > 107 106 107 107 105  CO2 25   < > 28   < > 24   < > 28 28 30 26 25   GLUCOSE 185*   < > 114*   < > 78   < > 106* 112* 118* 72 116*  BUN 52*   < > 41*   < > 45*   < > 12 11 12 15 9   CREATININE 1.58*   < > 1.44*   < > 3.38*   < > 0.82 0.88 0.84 0.99 0.88  CALCIUM  7.4*   < > 7.5*   < > 7.2*   < > 9.0 9.3 9.1 9.0 9.1  GFRNONAA 47*   < > 52*   < > 19*   < > >60 >60 >60  --  >60  PROT 4.6*  --  4.2*  --  3.9*   < > 6.4* 6.6 6.6 6.4  --   ALBUMIN  1.8*  1.7*   < > 1.7*  1.6*   < > 1.7*   < > 4.0 4.1 4.2  --   --   AST 33  --  31  --  25    < > 26 29 38 33  --   ALT 31  --  31  --  18   < > 31 34 46* 40  --   ALKPHOS 84  --  79  --  111   < > 92 93 100  --   --   BILITOT 3.2*  --  2.5*  --  0.8   < > 0.3 0.3 0.5 0.6  --  BILIDIR 1.7*  --  1.3*  --  0.2  --   --   --   --   --   --   IBILI 1.5*  --  1.2*  --  0.6  --   --   --   --   --   --    < > = values in this interval not displayed.    Studies Reviewed:  MR Brain W and Wo Contrast Result Date: 03/07/2023 CLINICAL DATA:  Initial evaluation for brain/CNS neoplasm. EXAM: MRI HEAD WITHOUT AND WITH CONTRAST TECHNIQUE: Multiplanar, multiecho pulse sequences of the brain and surrounding structures were obtained without and with intravenous contrast. CONTRAST:  8mL GADAVIST  GADOBUTROL  1 MMOL/ML IV SOLN COMPARISON:  CT from earlier the same day as well as previous MRI from 07/30/2022. FINDINGS: Brain: Age-related cerebral atrophy. Patchy T2/FLAIR hyperintensity involving the periventricular and deep white matter both cerebral hemispheres, most likely related to chronic microvascular ischemic disease. No acute or subacute infarct. Heterogeneous enhancing mass positioned at the right parietal lobe measures 4.1 x 3.1 x 3.1 cm (series 17, image 45). Associated susceptibility artifact consistent with necrosis and/or blood products. Surrounding T2/FLAIR signal intensity throughout the adjacent right cerebral hemisphere, consistent with vasogenic edema. Effacement of the posterior right lateral ventricle. Asymmetric dilatation of the right temporal horn consistent with a degree of ventricular trapping. No visible transependymal flow of CSF. Associated 4 mm of right-to-left shift. No other mass lesion or abnormal enhancement. No other acute or chronic intracranial blood products. Pituitary gland and suprasellar region within normal limits. Vascular: Major intracranial vascular flow voids are maintained. Skull and upper cervical spine: Craniocervical junction within normal limits. Bone marrow signal  intensity normal. No scalp soft tissue abnormality. Sinuses/Orbits: Globes and orbital soft tissues within normal limits. Mild mucosal thickening present about the ethmoidal air cells and maxillary sinuses. No significant mastoid effusion. Other: None. IMPRESSION: 1. 4.1 x 3.1 x 3.1 cm heterogeneous enhancing mass positioned at the right parietal lobe with surrounding vasogenic edema and 4 mm of right-to-left shift. Differential considerations include a solitary intracranial metastasis versus primary CNS neoplasm. 2. Underlying age-related cerebral atrophy with chronic small vessel ischemic disease. Electronically Signed   By: Morene Hoard M.D.   On: 03/07/2023 04:19   CT Head Wo Contrast Result Date: 03/06/2023 CLINICAL DATA:  Blunt polytrauma, weakness EXAM: CT HEAD WITHOUT CONTRAST CT CERVICAL SPINE WITHOUT CONTRAST TECHNIQUE: Multidetector CT imaging of the head and cervical spine was performed following the standard protocol without intravenous contrast. Multiplanar CT image reconstructions of the cervical spine were also generated. RADIATION DOSE REDUCTION: This exam was performed according to the departmental dose-optimization program which includes automated exposure control, adjustment of the mA and/or kV according to patient size and/or use of iterative reconstruction technique. COMPARISON:  MRI head 07/30/2022 and CT head 07/29/2022 FINDINGS: CT HEAD FINDINGS Brain: Hypoattenuation within the periventricular white matter of the right parietal and occipital lobes is favored due to edema. This appears new since PET/CT 12/27/2022. Possible 2.9 x 3.1 cm circumscribed mass in the posterior right parietal lobe (series 2/image 18). This may be the cause of the edema. MRI is recommended for further evaluation. There is mass effect on the atrium and occipital horn of the right lateral ventricle. 4 mm of leftward midline shift at the septum pellucidum. The basal cisterns are patent. No hydrocephalus. No  extra-axial fluid collection. Vascular: No hyperdense vessel or unexpected calcification. Skull: No acute fracture or destructive osseous lesion. Mucosal thickening  in the maxillary sinuses. Sinuses/Orbits: No acute finding. Other: None. CT CERVICAL SPINE FINDINGS Alignment: No evidence of traumatic listhesis. Skull base and vertebrae: No acute fracture. No primary bone lesion or focal pathologic process. Soft tissues and spinal canal: No prevertebral fluid or swelling. No visible canal hematoma. Disc levels: Multilevel spondylosis, disc space height loss, and degenerative endplate changes greatest at C5-C6 where it is advanced. No severe spinal canal narrowing. Upper chest: No acute abnormality. Other: None. IMPRESSION: 1. Findings suspicious for metastasis in the posterior right parietal lobe with surrounding vasogenic edema. 4 mm of leftward midline shift. MRI with and without IV contrast is recommended for further evaluation. 2. No acute fracture in the cervical spine. Electronically Signed   By: Norman Gatlin M.D.   On: 03/06/2023 20:44   CT Cervical Spine Wo Contrast Result Date: 03/06/2023 CLINICAL DATA:  Blunt polytrauma, weakness EXAM: CT HEAD WITHOUT CONTRAST CT CERVICAL SPINE WITHOUT CONTRAST TECHNIQUE: Multidetector CT imaging of the head and cervical spine was performed following the standard protocol without intravenous contrast. Multiplanar CT image reconstructions of the cervical spine were also generated. RADIATION DOSE REDUCTION: This exam was performed according to the departmental dose-optimization program which includes automated exposure control, adjustment of the mA and/or kV according to patient size and/or use of iterative reconstruction technique. COMPARISON:  MRI head 07/30/2022 and CT head 07/29/2022 FINDINGS: CT HEAD FINDINGS Brain: Hypoattenuation within the periventricular white matter of the right parietal and occipital lobes is favored due to edema. This appears new since PET/CT  12/27/2022. Possible 2.9 x 3.1 cm circumscribed mass in the posterior right parietal lobe (series 2/image 18). This may be the cause of the edema. MRI is recommended for further evaluation. There is mass effect on the atrium and occipital horn of the right lateral ventricle. 4 mm of leftward midline shift at the septum pellucidum. The basal cisterns are patent. No hydrocephalus. No extra-axial fluid collection. Vascular: No hyperdense vessel or unexpected calcification. Skull: No acute fracture or destructive osseous lesion. Mucosal thickening in the maxillary sinuses. Sinuses/Orbits: No acute finding. Other: None. CT CERVICAL SPINE FINDINGS Alignment: No evidence of traumatic listhesis. Skull base and vertebrae: No acute fracture. No primary bone lesion or focal pathologic process. Soft tissues and spinal canal: No prevertebral fluid or swelling. No visible canal hematoma. Disc levels: Multilevel spondylosis, disc space height loss, and degenerative endplate changes greatest at C5-C6 where it is advanced. No severe spinal canal narrowing. Upper chest: No acute abnormality. Other: None. IMPRESSION: 1. Findings suspicious for metastasis in the posterior right parietal lobe with surrounding vasogenic edema. 4 mm of leftward midline shift. MRI with and without IV contrast is recommended for further evaluation. 2. No acute fracture in the cervical spine. Electronically Signed   By: Norman Gatlin M.D.   On: 03/06/2023 20:44     ADDENDUM  .Patient was Personally and independently interviewed, examined and relevant elements of the history of present illness were reviewed in details and an assessment and plan was created. All elements of the patient's history of present illness , assessment and plan were discussed in details with Lisa Rouson NP. The above documentation reflects our combined findings assessment and plan.   I discussed the case in details with Dr Mavis from Neurosurgery as well. Patient presenting  with left sided wekaness and headache and some confusion. MRI Brain with isolated 4.1x3.1x3.1 cm enhancing mass in rt parietal lobe.  1) Primary CNS tumor vs Isolated CNS relapse of high grade large B cell  lymphoma Recommendations -CT chest/abd/pelvis -- to evaluate for primary tumor or relapsed lymphoma. If peripheral lesion present would biopsy that to confirm lymphoma recurrence or other primary tumor. -if no evidence of systemic recurrence of lymphoma or other primary tumor then will need consideration of resection of CNS lesions for diagnosis and to determine lymphoma vs primary CNS tumor -- since this would determine subsequent management. -dexamethasone  6mg  q6hours -discussed with Dr Mavis -- LP may not be possible due to findings of increased intracranial pressure. -will f/u on Monday. -no family at bedside and could not reach today on phone  Anni Hocevar MD MS

## 2023-03-07 NOTE — Progress Notes (Signed)
   03/07/23 1549  Vitals  Temp 97.6 F (36.4 C)  Temp Source Oral  BP (!) 174/98  MAP (mmHg) 117  BP Location Left Arm  BP Method Automatic  Patient Position (if appropriate) Sitting  Pulse Rate 99  Pulse Rate Source Monitor  Resp 16  Oxygen Therapy  SpO2 96 %  O2 Device Room Air

## 2023-03-07 NOTE — ED Notes (Signed)
 Patient transported to MRI

## 2023-03-07 NOTE — Progress Notes (Signed)
 Patient seen and examined.  Admitted early morning hours by nighttime hospitalist.  See H&P assessment plan.  I also discussed with his daughter on the phone.  Patient also speaks limited English. In brief, 73 year old gentleman with history of prostate cancer, chronic hepatitis B on tenofovir , MDS, high-grade large B-cell lymphoma status post CHOP 07/2022 complicated by severe pancytopenia and tumor lysis syndrome presented to the hospital with more than few weeks of episodic weakness on the left upper and lower extremity.  Patient tells me that it is on and off weakness and he had fallen many times.  At the emergency room he had no focal neurological deficit, however he was found to have right parietal tumor with vasogenic edema and 4 mm right-to-left shift.  Seen by neurosurgery and oncology with tentative plan for surgical resection.  Currently remains on high-dose dexamethasone .  Will consult PT OT to continue mobilizing.  Same-day admit.  No charge visit.

## 2023-03-07 NOTE — ED Notes (Signed)
 Ambulatory to restroom; awaiting MRI

## 2023-03-07 NOTE — Assessment & Plan Note (Addendum)
 Patient presented with left-sided weakness progressing over 3 days, and headaches x 3 weeks MRI consistent with solitary intracranial metastasis versus primary CNS neoplasm Continue Decadron  6 mg 3 times daily started in the ED Neurologic checks with fall and aspiration precautions Neurosurgery was consulted from the ED.  Formal consult placed to follow Oncology consult

## 2023-03-07 NOTE — Assessment & Plan Note (Addendum)
 Holding metoprolol due to sinus bradycardia Not on anticoagulation due to high bleeding risk

## 2023-03-07 NOTE — Hospital Course (Signed)
 Tyler [name]

## 2023-03-07 NOTE — Assessment & Plan Note (Signed)
 MDS S/p CHOP 07/2022 Consult heme-onc for recommendations

## 2023-03-07 NOTE — Progress Notes (Signed)
   03/07/23 1424  TOC Brief Assessment  Insurance and Status Reviewed  Patient has primary care physician Yes  Home environment has been reviewed lives with daughter  Prior level of function: independent  Prior/Current Home Services No current home services  Social Drivers of Health Review SDOH reviewed no interventions necessary  Readmission risk has been reviewed Yes  Transition of care needs no transition of care needs at this time         Transition of Care Department [facility])  will continue to monitor patient advancement through interdisciplinary progression rounds. If new patient transition needs arise, please place a TOC consult.

## 2023-03-07 NOTE — H&P (Signed)
 History and Physical    Patient: Tyler [name] FMW:[id number] DOB: [birth year] DOA: 03/06/2023 DOS: the patient was seen and examined on 03/07/2023 PCP: Mannie Toribio POUR, FNP  Patient coming from: Home  Chief Complaint:  Chief Complaint  Patient presents with   Weakness    HPI: Tyler [name] is a 73 y.o. 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  He first noticed that he was dropping things in his left arm and his left leg was giving out when he was walking leading to a fall on the day of arrival.  Also states he has been having intermittent headaches radiating from the back of his head to the front for the past 3 weeks.  It is piercing and at times feels like insects crawling over his head.  Lasts up to 4 minutes then goes away.  He denies visual changes and denies vomiting. ED course and data Review: BP 151/99, with bradycardia in the 50s and otherwise normal vitals. CBC and BMP unremarkable and urinalysis clean. EKG, personally viewed and interpreted showing NSR at 62 with nonspecific T wave abnormalities CT head was suspicious for metastasis and follow-up MRI brain confirmed a right parietal lobe mass with surrounding vasogenic edema and 4 mm of right-to-left shift-differential includes solitary intracranial metastasis versus primary CNS neoplasm. The ED provider spoke with neurosurgical team who recommended Decadron  and will see patient in the a.m.  Patient treated with Decadron  6 mg IV Hospitalist consulted for admission.     Past Medical History:  Diagnosis Date   Cancer [facility])    prostate    Follicular lymphoma (HCC) 2024   History of pulmonary embolism    Hypertension    Myelodysplastic syndrome (HCC)    PAF (paroxysmal atrial fibrillation) (HCC)    Recurrent UTI    Past Surgical History:  Procedure Laterality Date   BIOPSY  08/21/2022   Procedure: BIOPSY;  Surgeon: Albertus Gordy HERO, MD;  Location: [facility] ENDOSCOPY;  Service: Gastroenterology;;   ESOPHAGOGASTRODUODENOSCOPY (EGD) WITH PROPOFOL  N/A 08/21/2022   Procedure: ESOPHAGOGASTRODUODENOSCOPY (EGD) WITH PROPOFOL ;  Surgeon: Albertus Gordy HERO, MD;  Location: [facility] ENDOSCOPY;  Service: Gastroenterology;  Laterality: N/A;   IR IMAGING GUIDED PORT INSERTION  08/26/2022   LYMPHADENECTOMY Bilateral 09/01/2019   Procedure: LYMPHADENECTOMY;  Surgeon: Alvaro Hummer, MD;  Location: [facility] ORS;  Service: Urology;  Laterality: Bilateral;   ROBOT ASSISTED LAPAROSCOPIC RADICAL PROSTATECTOMY N/A 09/01/2019   Procedure: XI ROBOTIC ASSISTED LAPAROSCOPIC RADICAL PROSTATECTOMY;  Surgeon: Alvaro Hummer, MD;  Location: [facility] ORS;  Service: Urology;  Laterality: N/A;  3 HRS   Social History:  reports that he has never smoked. He has never used smokeless tobacco. He reports current alcohol  use. He reports that he does not use drugs.  Allergies  Allergen Reactions   Amitriptyline  Other (See Comments)    Nightmares, Night sweats, Migraines    Amlodipine Swelling    BILATERAL FEET TO ANKLES   Neurontin  [Gabapentin ] Other (See Comments)    Nightmares; Night sweats; Headache    Family History  Problem Relation Age of Onset   Stroke Father     Prior to Admission medications   Medication Sig Start  Date End Date Taking? Authorizing Provider  B Complex Vitamins (VITAMIN B COMPLEX ) TABS Take 1 tablet by mouth daily. 12/21/21   Amin, Ankit C, MD  cholecalciferol  (VITAMIN D3) 25 MCG (1000 UNIT) tablet Take 2 tablets (2,000 Units total) by mouth daily. 01/21/22   Onesimo Emaline Brink, MD  clotrimazole -betamethasone  (LOTRISONE ) cream APPLY TO AFFECTED AREA TWICE A  DAY 12/18/22   Kale, Gautam Kishore, MD  diclofenac  Sodium (VOLTAREN ) 1 % GEL APPLY 4 G TOPICALLY 4 (FOUR) TIMES DAILY. APPLY TO RIGHT LATERAL HIP AND THIGH DOWN TO KNEE Patient taking differently: Apply 4 g topically daily as needed (For pain). 06/12/22   Rosalynn Camie CROME, MD  folic acid  (FOLVITE ) 1 MG tablet Take 1 mg by mouth daily. 03/27/22   [provider]  furosemide  (LASIX ) 20 MG tablet Take 1 tablet (20 mg total) by mouth daily. 12/30/22   Barbaraann Darryle Ned, MD  KLOR-CON  M20 20 MEQ tablet TAKE 1 TABLET BY MOUTH TWICE A DAY 09/12/22   Onesimo Emaline Brink, MD  lidocaine -prilocaine  (EMLA ) cream Apply to affected area once 09/18/22   Thayil, Irene T, PA-C  Menthol-Methyl Salicylate (MUSCLE RUB) 10-15 % CREA Apply 1 Application topically daily as needed for muscle pain.    [provider]  metoprolol  succinate (TOPROL  XL) 25 MG 24 hr tablet Take 1 tablet (25 mg total) by mouth daily. 12/30/22   O'NealDarryle Ned, MD  Multiple Vitamin (MULTIVITAMIN WITH MINERALS) TABS tablet Take 1 tablet by mouth daily. 12/21/21   Amin, Ankit C, MD  ondansetron  (ZOFRAN ) 8 MG tablet Take 1 tablet (8 mg total) by mouth every 8 (eight) hours as needed for nausea or vomiting. Start on the third day after cyclophosphamide  chemotherapy. 07/26/22   Onesimo Emaline Brink, MD  oxyCODONE  (OXY IR/ROXICODONE ) 5 MG immediate release tablet Take 1 tablet (5 mg total) by mouth every 6 (six) hours as needed for severe pain. 08/28/22   Ghimire, Donalda HERO, MD  pantoprazole  (PROTONIX ) 40 MG tablet Take 1 tablet (40 mg total) by mouth 2 (two) times daily. For 12 weeks 08/28/22 11/08/22  Raenelle Donalda HERO, MD  predniSONE  (DELTASONE ) 50 MG tablet TAKE 1 TABLET BY MOUTH EVERY DAY WITH BREAKFAST FOR 5 DAYS STARTING THE DAY AFTER EACH CHEMOTHERAPY 11/16/22   Onesimo Emaline Brink, MD  Tenofovir  Alafenamide Fumarate (VEMLIDY ) 25 MG TABS Take 1 tablet (25 mg total) by mouth daily. 10/29/22   Dennise Kingsley, MD    Physical  Exam: Vitals:   03/07/23 0000 03/07/23 0100 03/07/23 0257 03/07/23 0400  BP: (!) 144/86  (!) 157/75   Pulse: (!) 50 75 (!) 57   Resp:   16   Temp:    97.7 F (36.5 C)  TempSrc:    Oral  SpO2: 100% 93% 98%    Physical Exam Vitals and nursing note reviewed.  Constitutional:      General: He is not in acute distress. HENT:     Head: Normocephalic and atraumatic.  Cardiovascular:     Rate and Rhythm: Normal rate and regular rhythm.     Heart sounds: Normal heart sounds.  Pulmonary:     Effort: Pulmonary effort is normal.     Breath sounds: Normal breath sounds.  Abdominal:     Palpations: Abdomen is soft.     Tenderness: There is no abdominal tenderness.  Neurological:     Mental Status: He is oriented to person, place, and time.     Motor: Weakness present.  Comments: 3 out of 5 strength left upper extremity     Labs on Admission: I have personally reviewed following labs and imaging studies  CBC: Recent Labs  Lab 03/06/23 2020  WBC 4.6  HGB 14.6  HCT 42.7  MCV 90.5  PLT 160   Basic Metabolic Panel: Recent Labs  Lab 03/06/23 2020  NA 140  K 3.6  CL 105  CO2 25  GLUCOSE 116*  BUN 9  CREATININE 0.88  CALCIUM  9.1   GFR: CrCl cannot be calculated (Unknown ideal weight.). Liver Function Tests: No results for input(s): AST, ALT, ALKPHOS, BILITOT, PROT, ALBUMIN  in the last 168 hours. No results for input(s): LIPASE, AMYLASE in the last 168 hours. No results for input(s): AMMONIA in the last 168 hours. Coagulation Profile: No results for input(s): INR, PROTIME in the last 168 hours. Cardiac Enzymes: No results for input(s): CKTOTAL, CKMB, CKMBINDEX, TROPONINI in the last 168 hours. BNP (last 3 results) No results for input(s): PROBNP in the last 8760 hours. HbA1C: No results for input(s): HGBA1C in the last 72 hours. CBG: No results for input(s): GLUCAP in the last 168 hours. Lipid Profile: No results for input(s):  CHOL, HDL, LDLCALC, TRIG, CHOLHDL, LDLDIRECT in the last 72 hours. Thyroid Function Tests: No results for input(s): TSH, T4TOTAL, FREET4, T3FREE, THYROIDAB in the last 72 hours. Anemia Panel: No results for input(s): VITAMINB12, FOLATE, FERRITIN, TIBC, IRON, RETICCTPCT in the last 72 hours. Urine analysis:    Component Value Date/Time   COLORURINE YELLOW 03/06/2023 2024   APPEARANCEUR HAZY (A) 03/06/2023 2024   LABSPEC 1.009 03/06/2023 2024   PHURINE 5.0 03/06/2023 2024   GLUCOSEU NEGATIVE 03/06/2023 2024   HGBUR NEGATIVE 03/06/2023 2024   BILIRUBINUR NEGATIVE 03/06/2023 2024   KETONESUR NEGATIVE 03/06/2023 2024   PROTEINUR NEGATIVE 03/06/2023 2024   NITRITE NEGATIVE 03/06/2023 2024   LEUKOCYTESUR NEGATIVE 03/06/2023 2024    Radiological Exams on Admission: MR Brain W and Wo Contrast Result Date: 03/07/2023 CLINICAL DATA:  Initial evaluation for brain/CNS neoplasm. EXAM: MRI HEAD WITHOUT AND WITH CONTRAST TECHNIQUE: Multiplanar, multiecho pulse sequences of the brain and surrounding structures were obtained without and with intravenous contrast. CONTRAST:  8mL GADAVIST  GADOBUTROL  1 MMOL/ML IV SOLN COMPARISON:  CT from earlier the same day as well as previous MRI from 07/30/2022. FINDINGS: Brain: Age-related cerebral atrophy. Patchy T2/FLAIR hyperintensity involving the periventricular and deep white matter both cerebral hemispheres, most likely related to chronic microvascular ischemic disease. No acute or subacute infarct. Heterogeneous enhancing mass positioned at the right parietal lobe measures 4.1 x 3.1 x 3.1 cm (series 17, image 45). Associated susceptibility artifact consistent with necrosis and/or blood products. Surrounding T2/FLAIR signal intensity throughout the adjacent right cerebral hemisphere, consistent with vasogenic edema. Effacement of the posterior right lateral ventricle. Asymmetric dilatation of the right temporal horn consistent with a  degree of ventricular trapping. No visible transependymal flow of CSF. Associated 4 mm of right-to-left shift. No other mass lesion or abnormal enhancement. No other acute or chronic intracranial blood products. Pituitary gland and suprasellar region within normal limits. Vascular: Major intracranial vascular flow voids are maintained. Skull and upper cervical spine: Craniocervical junction within normal limits. Bone marrow signal intensity normal. No scalp soft tissue abnormality. Sinuses/Orbits: Globes and orbital soft tissues within normal limits. Mild mucosal thickening present about the ethmoidal air cells and maxillary sinuses. No significant mastoid effusion. Other: None. IMPRESSION: 1. 4.1 x 3.1 x 3.1 cm heterogeneous enhancing mass positioned at the right parietal lobe with  surrounding vasogenic edema and 4 mm of right-to-left shift. Differential considerations include a solitary intracranial metastasis versus primary CNS neoplasm. 2. Underlying age-related cerebral atrophy with chronic small vessel ischemic disease. Electronically Signed   By: Morene Hoard M.D.   On: 03/07/2023 04:19   CT Head Wo Contrast Result Date: 03/06/2023 CLINICAL DATA:  Blunt polytrauma, weakness EXAM: CT HEAD WITHOUT CONTRAST CT CERVICAL SPINE WITHOUT CONTRAST TECHNIQUE: Multidetector CT imaging of the head and cervical spine was performed following the standard protocol without intravenous contrast. Multiplanar CT image reconstructions of the cervical spine were also generated. RADIATION DOSE REDUCTION: This exam was performed according to the departmental dose-optimization program which includes automated exposure control, adjustment of the mA and/or kV according to patient size and/or use of iterative reconstruction technique. COMPARISON:  MRI head 07/30/2022 and CT head 07/29/2022 FINDINGS: CT HEAD FINDINGS Brain: Hypoattenuation within the periventricular white matter of the right parietal and occipital lobes is  favored due to edema. This appears new since PET/CT 12/27/2022. Possible 2.9 x 3.1 cm circumscribed mass in the posterior right parietal lobe (series 2/image 18). This may be the cause of the edema. MRI is recommended for further evaluation. There is mass effect on the atrium and occipital horn of the right lateral ventricle. 4 mm of leftward midline shift at the septum pellucidum. The basal cisterns are patent. No hydrocephalus. No extra-axial fluid collection. Vascular: No hyperdense vessel or unexpected calcification. Skull: No acute fracture or destructive osseous lesion. Mucosal thickening in the maxillary sinuses. Sinuses/Orbits: No acute finding. Other: None. CT CERVICAL SPINE FINDINGS Alignment: No evidence of traumatic listhesis. Skull base and vertebrae: No acute fracture. No primary bone lesion or focal pathologic process. Soft tissues and spinal canal: No prevertebral fluid or swelling. No visible canal hematoma. Disc levels: Multilevel spondylosis, disc space height loss, and degenerative endplate changes greatest at C5-C6 where it is advanced. No severe spinal canal narrowing. Upper chest: No acute abnormality. Other: None. IMPRESSION: 1. Findings suspicious for metastasis in the posterior right parietal lobe with surrounding vasogenic edema. 4 mm of leftward midline shift. MRI with and without IV contrast is recommended for further evaluation. 2. No acute fracture in the cervical spine. Electronically Signed   By: Norman Gatlin M.D.   On: 03/06/2023 20:44   CT Cervical Spine Wo Contrast Result Date: 03/06/2023 CLINICAL DATA:  Blunt polytrauma, weakness EXAM: CT HEAD WITHOUT CONTRAST CT CERVICAL SPINE WITHOUT CONTRAST TECHNIQUE: Multidetector CT imaging of the head and cervical spine was performed following the standard protocol without intravenous contrast. Multiplanar CT image reconstructions of the cervical spine were also generated. RADIATION DOSE REDUCTION: This exam was performed according to  the departmental dose-optimization program which includes automated exposure control, adjustment of the mA and/or kV according to patient size and/or use of iterative reconstruction technique. COMPARISON:  MRI head 07/30/2022 and CT head 07/29/2022 FINDINGS: CT HEAD FINDINGS Brain: Hypoattenuation within the periventricular white matter of the right parietal and occipital lobes is favored due to edema. This appears new since PET/CT 12/27/2022. Possible 2.9 x 3.1 cm circumscribed mass in the posterior right parietal lobe (series 2/image 18). This may be the cause of the edema. MRI is recommended for further evaluation. There is mass effect on the atrium and occipital horn of the right lateral ventricle. 4 mm of leftward midline shift at the septum pellucidum. The basal cisterns are patent. No hydrocephalus. No extra-axial fluid collection. Vascular: No hyperdense vessel or unexpected calcification. Skull: No acute fracture or destructive osseous  lesion. Mucosal thickening in the maxillary sinuses. Sinuses/Orbits: No acute finding. Other: None. CT CERVICAL SPINE FINDINGS Alignment: No evidence of traumatic listhesis. Skull base and vertebrae: No acute fracture. No primary bone lesion or focal pathologic process. Soft tissues and spinal canal: No prevertebral fluid or swelling. No visible canal hematoma. Disc levels: Multilevel spondylosis, disc space height loss, and degenerative endplate changes greatest at C5-C6 where it is advanced. No severe spinal canal narrowing. Upper chest: No acute abnormality. Other: None. IMPRESSION: 1. Findings suspicious for metastasis in the posterior right parietal lobe with surrounding vasogenic edema. 4 mm of leftward midline shift. MRI with and without IV contrast is recommended for further evaluation. 2. No acute fracture in the cervical spine. Electronically Signed   By: Norman Gatlin M.D.   On: 03/06/2023 20:44     Data Reviewed: Relevant notes from primary care and  specialist visits, past discharge summaries as available in EHR, including Care Everywhere. Prior diagnostic testing as pertinent to current admission diagnoses Updated medications and problem lists for reconciliation ED course, including vitals, labs, imaging, treatment and response to treatment Triage notes, nursing and pharmacy notes and ED provider's notes Notable results as noted in HPI   Assessment and Plan: * Brain tumor with vasogenic edema [facility]) Patient presented with left-sided weakness progressing over 3 days, and headaches x 3 weeks MRI consistent with solitary intracranial metastasis versus primary CNS neoplasm Continue Decadron  6 mg 3 times daily started in the ED Neurologic checks with fall and aspiration precautions Neurosurgery was consulted from the ED.  Formal consult placed to follow Oncology consult  Sinus bradycardia Will hold metoprolol .  Heart rate was in the 50s  Chronic hepatitis B (HCC) On tenofovir  Followed by ID outpatient  Prostate cancer with hx of prostatectomy Oncology consult  History of postprocedural pulmonary embolism No acute issues suspected  Peptic ulcer disease History of acute gastric ulcer on EGD June 2024 Not currently on PPIs  Diffuse large B-cell lymphoma of lymph nodes of multiple regions [facility]) MDS S/p CHOP 07/2022 Consult heme-onc for recommendations   Paroxysmal atrial fibrillation (HCC) Holding metoprolol  due to sinus bradycardia Not on anticoagulation due to high bleeding risk     DVT prophylaxis: Lovenox   Consults: Neurosurgery.  Please consult oncology in the a.m.  Advance Care Planning:   Code Status: Prior   Family Communication: none  Disposition Plan: Back to previous home environment  Severity of Illness: The appropriate patient status for this patient is INPATIENT. Inpatient status is judged to be reasonable and necessary in order to provide the required intensity of service to ensure the patient's  safety. The patient's presenting symptoms, physical exam findings, and initial radiographic and laboratory data in the context of their chronic comorbidities is felt to place them at high risk for further clinical deterioration. Furthermore, it is not anticipated that the patient will be medically stable for discharge from the hospital within 2 midnights of admission.   * I certify that at the point of admission it is my clinical judgment that the patient will require inpatient hospital care spanning beyond 2 midnights from the point of admission due to high intensity of service, high risk for further deterioration and high frequency of surveillance required.*  Author: Delayne LULLA Solian, MD 03/07/2023 5:44 AM  For on call review [email or web address].

## 2023-03-07 NOTE — Assessment & Plan Note (Signed)
Oncology consult

## 2023-03-07 NOTE — Assessment & Plan Note (Signed)
 No acute issues suspected

## 2023-03-07 NOTE — Assessment & Plan Note (Signed)
 Will hold metoprolol.  Heart rate was in the 50s

## 2023-03-07 NOTE — ED Notes (Signed)
 ED TO INPATIENT HANDOFF REPORT  ED Nurse Name and Phone #:  Albino [phone]  S Name/Age/Gender Tyler [name] 73 y.o. male Room/Bed: 018C/018C  Code Status   Code Status: Full Code  Home/SNF/Other Home Patient oriented to: self, place, time, and situation Is this baseline? Yes   Triage Complete: Triage complete  Chief Complaint Brain tumor [facility]) [D49.6]  Triage Note Increasing weakness to left arm and leg x 1 week.  Sometimes drops objects when  holding with left hand. Today had a fall d/t left leg giving way.  Now having headache and neck pain but states that was prior to the fall today.  No known injury from the fall itself.     Allergies Allergies  Allergen Reactions   Amitriptyline  Other (See Comments)    Nightmares, Night sweats, Migraines    Amlodipine Swelling    BILATERAL FEET TO ANKLES   Neurontin  [Gabapentin ] Other (See Comments)    Nightmares; Night sweats; Headache    Level of Care/Admitting Diagnosis ED Disposition     ED Disposition  Admit   Condition  --   Comment  Hospital Area: [facility] [[id number]]  Level of Care: Telemetry Medical [104]  May admit patient to [facility] or [facility] if equivalent level of care is available:: No  Covid Evaluation: Asymptomatic - no recent exposure (last 10 days) testing not required  Diagnosis: Brain tumor [facility]) [[id number]]  Admitting Physician: CLEATUS DELAYNE GAILS [[id number]]  Attending Physician: CLEATUS DELAYNE GAILS [[id number]]  Certification:: I certify this patient will need inpatient services for at least 2 midnights  Expected Medical Readiness: 03/10/2023          B Medical/Surgery History Past Medical History:  Diagnosis Date   Cancer [facility])    prostate   Follicular lymphoma (HCC) 2024   History of pulmonary embolism    Hypertension    Myelodysplastic syndrome (HCC)    PAF (paroxysmal atrial fibrillation) (HCC)    Recurrent UTI    Past Surgical History:  Procedure Laterality Date    BIOPSY  08/21/2022   Procedure: BIOPSY;  Surgeon: Albertus Gordy HERO, MD;  Location: [facility] ENDOSCOPY;  Service: Gastroenterology;;   ESOPHAGOGASTRODUODENOSCOPY (EGD) WITH PROPOFOL  N/A 08/21/2022   Procedure: ESOPHAGOGASTRODUODENOSCOPY (EGD) WITH PROPOFOL ;  Surgeon: Albertus Gordy HERO, MD;  Location: [facility] ENDOSCOPY;  Service: Gastroenterology;  Laterality: N/A;   IR IMAGING GUIDED PORT INSERTION  08/26/2022   LYMPHADENECTOMY Bilateral 09/01/2019   Procedure: LYMPHADENECTOMY;  Surgeon: Alvaro Hummer, MD;  Location: [facility] ORS;  Service: Urology;  Laterality: Bilateral;   ROBOT ASSISTED LAPAROSCOPIC RADICAL PROSTATECTOMY N/A 09/01/2019   Procedure: XI ROBOTIC ASSISTED LAPAROSCOPIC RADICAL PROSTATECTOMY;  Surgeon: Alvaro Hummer, MD;  Location: [facility] ORS;  Service: Urology;  Laterality: N/A;  3 HRS     A IV Location/Drains/Wounds Patient Lines/Drains/Airways Status     Active Line/Drains/Airways     Name Placement date Placement time Site Days   Implanted Port 08/26/22 Right Chest 08/26/22  1125  Chest  193   Peripheral IV 03/07/23 22 G 1 Distal;Posterior;Right Wrist 03/07/23  0231  Wrist  less than 1   Incision - 6 Ports Abdomen 1: Left;Lateral;Lower 2: Left;Lateral;Upper 3: Umbilicus;Superior 4: Right;Lateral;Lower 5: Right;Medial;Upper 6: Right;Lateral;Upper 09/01/19  --  -- 1283            Intake/Output Last 24 hours No intake or output data in the 24 hours ending 03/07/23 9377  Labs/Imaging Results for orders placed or performed during the hospital encounter of 03/06/23 (from the  past 48 hours)  Basic metabolic panel     Status: Abnormal   Collection Time: 03/06/23  8:20 PM  Result Value Ref Range   Sodium 140 135 - 145 mmol/L   Potassium 3.6 3.5 - 5.1 mmol/L   Chloride 105 98 - 111 mmol/L   CO2 25 22 - 32 mmol/L   Glucose, Bld 116 (H) 70 - 99 mg/dL    Comment: Glucose reference range applies only to samples taken after fasting for at least 8 hours.   BUN 9 8 - 23 mg/dL   Creatinine, Ser 9.11  0.61 - 1.24 mg/dL   Calcium  9.1 8.9 - 10.3 mg/dL   GFR, Estimated >39 >39 mL/min    Comment: (NOTE) Calculated using the CKD-EPI Creatinine Equation (2021)    Anion gap 10 5 - 15    Comment: Performed at [facility], [address], [place], [place] [address]  CBC     Status: None   Collection Time: 03/06/23  8:20 PM  Result Value Ref Range   WBC 4.6 4.0 - 10.5 K/uL   RBC 4.72 4.22 - 5.81 MIL/uL   Hemoglobin 14.6 13.0 - 17.0 g/dL   HCT 57.2 60.9 - 47.9 %   MCV 90.5 80.0 - 100.0 fL   MCH 30.9 26.0 - 34.0 pg   MCHC 34.2 30.0 - 36.0 g/dL   RDW 87.6 88.4 - 84.4 %   Platelets 160 150 - 400 K/uL   nRBC 0.0 0.0 - 0.2 %    Comment: Performed at [facility], [address] [place], [place] [address]  Urinalysis, Routine w reflex microscopic -Urine, Clean Catch     Status: Abnormal   Collection Time: 03/06/23  8:24 PM  Result Value Ref Range   Color, Urine YELLOW YELLOW   APPearance HAZY (A) CLEAR   Specific Gravity, Urine 1.009 1.005 - 1.030   pH 5.0 5.0 - 8.0   Glucose, UA NEGATIVE NEGATIVE mg/dL   Hgb urine dipstick NEGATIVE NEGATIVE   Bilirubin Urine NEGATIVE NEGATIVE   Ketones, ur NEGATIVE NEGATIVE mg/dL   Protein, ur NEGATIVE NEGATIVE mg/dL   Nitrite NEGATIVE NEGATIVE   Leukocytes,Ua NEGATIVE NEGATIVE    Comment: Performed at [facility], [address], [place], [place] [address]   MR Brain W and Wo Contrast Result Date: 03/07/2023 CLINICAL DATA:  Initial evaluation for brain/CNS neoplasm. EXAM: MRI HEAD WITHOUT AND WITH CONTRAST TECHNIQUE: Multiplanar, multiecho pulse sequences of the brain and surrounding structures were obtained without and with intravenous contrast. CONTRAST:  8mL GADAVIST  GADOBUTROL  1 MMOL/ML IV SOLN COMPARISON:  CT from earlier the same day as well as previous MRI from 07/30/2022. FINDINGS: Brain: Age-related cerebral atrophy. Patchy T2/FLAIR hyperintensity involving the periventricular and deep white matter both cerebral hemispheres,  most likely related to chronic microvascular ischemic disease. No acute or subacute infarct. Heterogeneous enhancing mass positioned at the right parietal lobe measures 4.1 x 3.1 x 3.1 cm (series 17, image 45). Associated susceptibility artifact consistent with necrosis and/or blood products. Surrounding T2/FLAIR signal intensity throughout the adjacent right cerebral hemisphere, consistent with vasogenic edema. Effacement of the posterior right lateral ventricle. Asymmetric dilatation of the right temporal horn consistent with a degree of ventricular trapping. No visible transependymal flow of CSF. Associated 4 mm of right-to-left shift. No other mass lesion or abnormal enhancement. No other acute or chronic intracranial blood products. Pituitary gland and suprasellar region within normal limits. Vascular: Major intracranial vascular flow voids are maintained.  Skull and upper cervical spine: Craniocervical junction within normal limits. Bone marrow signal intensity normal. No scalp soft tissue abnormality. Sinuses/Orbits: Globes and orbital soft tissues within normal limits. Mild mucosal thickening present about the ethmoidal air cells and maxillary sinuses. No significant mastoid effusion. Other: None. IMPRESSION: 1. 4.1 x 3.1 x 3.1 cm heterogeneous enhancing mass positioned at the right parietal lobe with surrounding vasogenic edema and 4 mm of right-to-left shift. Differential considerations include a solitary intracranial metastasis versus primary CNS neoplasm. 2. Underlying age-related cerebral atrophy with chronic small vessel ischemic disease. Electronically Signed   By: Morene Hoard M.D.   On: 03/07/2023 04:19   CT Head Wo Contrast Result Date: 03/06/2023 CLINICAL DATA:  Blunt polytrauma, weakness EXAM: CT HEAD WITHOUT CONTRAST CT CERVICAL SPINE WITHOUT CONTRAST TECHNIQUE: Multidetector CT imaging of the head and cervical spine was performed following the standard protocol without intravenous  contrast. Multiplanar CT image reconstructions of the cervical spine were also generated. RADIATION DOSE REDUCTION: This exam was performed according to the departmental dose-optimization program which includes automated exposure control, adjustment of the mA and/or kV according to patient size and/or use of iterative reconstruction technique. COMPARISON:  MRI head 07/30/2022 and CT head 07/29/2022 FINDINGS: CT HEAD FINDINGS Brain: Hypoattenuation within the periventricular white matter of the right parietal and occipital lobes is favored due to edema. This appears new since PET/CT 12/27/2022. Possible 2.9 x 3.1 cm circumscribed mass in the posterior right parietal lobe (series 2/image 18). This may be the cause of the edema. MRI is recommended for further evaluation. There is mass effect on the atrium and occipital horn of the right lateral ventricle. 4 mm of leftward midline shift at the septum pellucidum. The basal cisterns are patent. No hydrocephalus. No extra-axial fluid collection. Vascular: No hyperdense vessel or unexpected calcification. Skull: No acute fracture or destructive osseous lesion. Mucosal thickening in the maxillary sinuses. Sinuses/Orbits: No acute finding. Other: None. CT CERVICAL SPINE FINDINGS Alignment: No evidence of traumatic listhesis. Skull base and vertebrae: No acute fracture. No primary bone lesion or focal pathologic process. Soft tissues and spinal canal: No prevertebral fluid or swelling. No visible canal hematoma. Disc levels: Multilevel spondylosis, disc space height loss, and degenerative endplate changes greatest at C5-C6 where it is advanced. No severe spinal canal narrowing. Upper chest: No acute abnormality. Other: None. IMPRESSION: 1. Findings suspicious for metastasis in the posterior right parietal lobe with surrounding vasogenic edema. 4 mm of leftward midline shift. MRI with and without IV contrast is recommended for further evaluation. 2. No acute fracture in the  cervical spine. Electronically Signed   By: Norman Gatlin M.D.   On: 03/06/2023 20:44   CT Cervical Spine Wo Contrast Result Date: 03/06/2023 CLINICAL DATA:  Blunt polytrauma, weakness EXAM: CT HEAD WITHOUT CONTRAST CT CERVICAL SPINE WITHOUT CONTRAST TECHNIQUE: Multidetector CT imaging of the head and cervical spine was performed following the standard protocol without intravenous contrast. Multiplanar CT image reconstructions of the cervical spine were also generated. RADIATION DOSE REDUCTION: This exam was performed according to the departmental dose-optimization program which includes automated exposure control, adjustment of the mA and/or kV according to patient size and/or use of iterative reconstruction technique. COMPARISON:  MRI head 07/30/2022 and CT head 07/29/2022 FINDINGS: CT HEAD FINDINGS Brain: Hypoattenuation within the periventricular white matter of the right parietal and occipital lobes is favored due to edema. This appears new since PET/CT 12/27/2022. Possible 2.9 x 3.1 cm circumscribed mass in the posterior right parietal lobe (series 2/image  18). This may be the cause of the edema. MRI is recommended for further evaluation. There is mass effect on the atrium and occipital horn of the right lateral ventricle. 4 mm of leftward midline shift at the septum pellucidum. The basal cisterns are patent. No hydrocephalus. No extra-axial fluid collection. Vascular: No hyperdense vessel or unexpected calcification. Skull: No acute fracture or destructive osseous lesion. Mucosal thickening in the maxillary sinuses. Sinuses/Orbits: No acute finding. Other: None. CT CERVICAL SPINE FINDINGS Alignment: No evidence of traumatic listhesis. Skull base and vertebrae: No acute fracture. No primary bone lesion or focal pathologic process. Soft tissues and spinal canal: No prevertebral fluid or swelling. No visible canal hematoma. Disc levels: Multilevel spondylosis, disc space height loss, and degenerative  endplate changes greatest at C5-C6 where it is advanced. No severe spinal canal narrowing. Upper chest: No acute abnormality. Other: None. IMPRESSION: 1. Findings suspicious for metastasis in the posterior right parietal lobe with surrounding vasogenic edema. 4 mm of leftward midline shift. MRI with and without IV contrast is recommended for further evaluation. 2. No acute fracture in the cervical spine. Electronically Signed   By: Norman Gatlin M.D.   On: 03/06/2023 20:44    Pending Labs Unresulted Labs (From admission, onward)     Start     Ordered   03/14/23 0500  Creatinine, serum  (enoxaparin  (LOVENOX )    CrCl >/= 30 ml/min)  Weekly,   R     Comments: while on enoxaparin  therapy    03/07/23 0549            Vitals/Pain Today's Vitals   03/07/23 0100 03/07/23 0257 03/07/23 0400 03/07/23 0613  BP:  (!) 157/75 136/86   Pulse: 75 (!) 57 (!) 54   Resp:  16    Temp:   97.7 F (36.5 C)   TempSrc:   Oral   SpO2: 93% 98% 100%   PainSc:    1     Isolation Precautions No active isolations  Medications Medications  dexamethasone  (DECADRON ) injection 8 mg (has no administration in time range)  tenofovir  alafenamide (VEMLIDY ) tablet 25 mg (has no administration in time range)  pantoprazole  (PROTONIX ) EC tablet 40 mg (has no administration in time range)  enoxaparin  (LOVENOX ) injection 40 mg (has no administration in time range)  acetaminophen  (TYLENOL ) tablet 650 mg (has no administration in time range)    Or  acetaminophen  (TYLENOL ) suppository 650 mg (has no administration in time range)  ondansetron  (ZOFRAN ) tablet 4 mg (has no administration in time range)    Or  ondansetron  (ZOFRAN ) injection 4 mg (has no administration in time range)  HYDROcodone -acetaminophen  (NORCO/VICODIN) 5-325 MG per tablet 1-2 tablet (has no administration in time range)  gadobutrol  (GADAVIST ) 1 MMOL/ML injection 8 mL (8 mLs Intravenous Contrast Given 03/07/23 0232)  dexamethasone  (DECADRON )  injection 6 mg (6 mg Intravenous Given 03/07/23 0529)    Mobility walks     Focused Assessments Neuro Assessment Handoff:  Swallow screen pass? Yes  Cardiac Rhythm: Normal sinus rhythm       Neuro Assessment: Exceptions to WDL (Patient c/o mild headache & generalized weakness.) Neuro Checks:      Has TPA been given? No If patient is a Neuro Trauma and patient is going to OR before floor call report to 4N Charge nurse: [phone] or [phone]   R Recommendations: See Admitting Provider Note  Report given to:   Additional Notes: Family - Zettie [phone]

## 2023-03-07 NOTE — Consult Note (Signed)
 Reason for Consult: Brain tumor Referring Physician: Dr. Raenelle Manus [name] is an 73 y.o. male.  HPI: The patient is a 73 year old black male immigrant with a history of atrial fibrillation, normally embolism, hypertension, prostate cancer, lymphoma, GI bleed, etc. his Eliquis  was stopped because of GI bleeding.  The patient was treated with chemotherapy for his lymphoma.  He presented to the ER yesterday with left-sided weakness.  He was worked up with a head CT which demonstrated a right parietal brain mass and edema.  This was confirmed to be a solitary lesion on a brain MRI.  A neurosurgical consultation was requested.  Presently the patient is alert and pleasant.  He complains of left-sided weakness.  Past Medical History:  Diagnosis Date   Cancer [facility])    prostate   Follicular lymphoma (HCC) 2024   History of pulmonary embolism    Hypertension    Myelodysplastic syndrome (HCC)    PAF (paroxysmal atrial fibrillation) (HCC)    Recurrent UTI     Past Surgical History:  Procedure Laterality Date   BIOPSY  08/21/2022   Procedure: BIOPSY;  Surgeon: Albertus Gordy HERO, MD;  Location: [facility] ENDOSCOPY;  Service: Gastroenterology;;   ESOPHAGOGASTRODUODENOSCOPY (EGD) WITH PROPOFOL  N/A 08/21/2022   Procedure: ESOPHAGOGASTRODUODENOSCOPY (EGD) WITH PROPOFOL ;  Surgeon: Albertus Gordy HERO, MD;  Location: [facility] ENDOSCOPY;  Service: Gastroenterology;  Laterality: N/A;   IR IMAGING GUIDED PORT INSERTION  08/26/2022   LYMPHADENECTOMY Bilateral 09/01/2019   Procedure: LYMPHADENECTOMY;  Surgeon: Alvaro Hummer, MD;  Location: [facility] ORS;  Service: Urology;  Laterality: Bilateral;   ROBOT ASSISTED LAPAROSCOPIC RADICAL PROSTATECTOMY N/A 09/01/2019   Procedure: XI ROBOTIC ASSISTED LAPAROSCOPIC RADICAL PROSTATECTOMY;  Surgeon: Alvaro Hummer, MD;  Location: [facility] ORS;  Service: Urology;  Laterality: N/A;  3 HRS    Family History  Problem Relation Age of Onset   Stroke Father     Social History:  reports that he has  never smoked. He has never used smokeless tobacco. He reports current alcohol  use. He reports that he does not use drugs.  Allergies:  Allergies  Allergen Reactions   Amitriptyline  Other (See Comments)    Nightmares, Night sweats, Migraines    Amlodipine Swelling    BILATERAL FEET TO ANKLES   Neurontin  [Gabapentin ] Other (See Comments)    Nightmares; Night sweats; Headache    Medications: I have reviewed the patient's current medications. Prior to Admission: (Not in a hospital admission)  Scheduled:  dexamethasone  (DECADRON ) injection  8 mg Intravenous Q12H   enoxaparin  (LOVENOX ) injection  40 mg Subcutaneous Q24H   pantoprazole   40 mg Oral BID   tenofovir  alafenamide  25 mg Oral Daily   Continuous: PRN:acetaminophen  **OR** acetaminophen , HYDROcodone -acetaminophen , ondansetron  **OR** ondansetron  (ZOFRAN ) IV Anti-infectives (From admission, onward)    Start     Dose/Rate Route Frequency Ordered Stop   03/07/23 1000  tenofovir  alafenamide (VEMLIDY ) tablet 25 mg        25 mg Oral Daily 03/07/23 0549          Results for orders placed or performed during the hospital encounter of 03/06/23 (from the past 48 hours)  Basic metabolic panel     Status: Abnormal   Collection Time: 03/06/23  8:20 PM  Result Value Ref Range   Sodium 140 135 - 145 mmol/L   Potassium 3.6 3.5 - 5.1 mmol/L   Chloride 105 98 - 111 mmol/L   CO2 25 22 - 32 mmol/L   Glucose, Bld 116 (H) 70 - 99 mg/dL  Comment: Glucose reference range applies only to samples taken after fasting for at least 8 hours.   BUN 9 8 - 23 mg/dL   Creatinine, Ser 9.11 0.61 - 1.24 mg/dL   Calcium  9.1 8.9 - 10.3 mg/dL   GFR, Estimated >39 >39 mL/min    Comment: (NOTE) Calculated using the CKD-EPI Creatinine Equation (2021)    Anion gap 10 5 - 15    Comment: Performed at [facility], [address] [place], [place] [address]  CBC     Status: None   Collection Time: 03/06/23  8:20 PM  Result Value Ref Range   WBC 4.6  4.0 - 10.5 K/uL   RBC 4.72 4.22 - 5.81 MIL/uL   Hemoglobin 14.6 13.0 - 17.0 g/dL   HCT 57.2 60.9 - 47.9 %   MCV 90.5 80.0 - 100.0 fL   MCH 30.9 26.0 - 34.0 pg   MCHC 34.2 30.0 - 36.0 g/dL   RDW 87.6 88.4 - 84.4 %   Platelets 160 150 - 400 K/uL   nRBC 0.0 0.0 - 0.2 %    Comment: Performed at [facility], [address] [place], [place] [address]  Urinalysis, Routine w reflex microscopic -Urine, Clean Catch     Status: Abnormal   Collection Time: 03/06/23  8:24 PM  Result Value Ref Range   Color, Urine YELLOW YELLOW   APPearance HAZY (A) CLEAR   Specific Gravity, Urine 1.009 1.005 - 1.030   pH 5.0 5.0 - 8.0   Glucose, UA NEGATIVE NEGATIVE mg/dL   Hgb urine dipstick NEGATIVE NEGATIVE   Bilirubin Urine NEGATIVE NEGATIVE   Ketones, ur NEGATIVE NEGATIVE mg/dL   Protein, ur NEGATIVE NEGATIVE mg/dL   Nitrite NEGATIVE NEGATIVE   Leukocytes,Ua NEGATIVE NEGATIVE    Comment: Performed at [facility], [address], [place], [place] [address]    MR Brain W and Wo Contrast Result Date: 03/07/2023 CLINICAL DATA:  Initial evaluation for brain/CNS neoplasm. EXAM: MRI HEAD WITHOUT AND WITH CONTRAST TECHNIQUE: Multiplanar, multiecho pulse sequences of the brain and surrounding structures were obtained without and with intravenous contrast. CONTRAST:  8mL GADAVIST  GADOBUTROL  1 MMOL/ML IV SOLN COMPARISON:  CT from earlier the same day as well as previous MRI from 07/30/2022. FINDINGS: Brain: Age-related cerebral atrophy. Patchy T2/FLAIR hyperintensity involving the periventricular and deep white matter both cerebral hemispheres, most likely related to chronic microvascular ischemic disease. No acute or subacute infarct. Heterogeneous enhancing mass positioned at the right parietal lobe measures 4.1 x 3.1 x 3.1 cm (series 17, image 45). Associated susceptibility artifact consistent with necrosis and/or blood products. Surrounding T2/FLAIR signal intensity throughout the adjacent right cerebral  hemisphere, consistent with vasogenic edema. Effacement of the posterior right lateral ventricle. Asymmetric dilatation of the right temporal horn consistent with a degree of ventricular trapping. No visible transependymal flow of CSF. Associated 4 mm of right-to-left shift. No other mass lesion or abnormal enhancement. No other acute or chronic intracranial blood products. Pituitary gland and suprasellar region within normal limits. Vascular: Major intracranial vascular flow voids are maintained. Skull and upper cervical spine: Craniocervical junction within normal limits. Bone marrow signal intensity normal. No scalp soft tissue abnormality. Sinuses/Orbits: Globes and orbital soft tissues within normal limits. Mild mucosal thickening present about the ethmoidal air cells and maxillary sinuses. No significant mastoid effusion. Other: None. IMPRESSION: 1. 4.1 x 3.1 x 3.1 cm heterogeneous enhancing mass positioned at the right parietal lobe with surrounding vasogenic edema and 4  mm of right-to-left shift. Differential considerations include a solitary intracranial metastasis versus primary CNS neoplasm. 2. Underlying age-related cerebral atrophy with chronic small vessel ischemic disease. Electronically Signed   By: Morene Hoard M.D.   On: 03/07/2023 04:19   CT Head Wo Contrast Result Date: 03/06/2023 CLINICAL DATA:  Blunt polytrauma, weakness EXAM: CT HEAD WITHOUT CONTRAST CT CERVICAL SPINE WITHOUT CONTRAST TECHNIQUE: Multidetector CT imaging of the head and cervical spine was performed following the standard protocol without intravenous contrast. Multiplanar CT image reconstructions of the cervical spine were also generated. RADIATION DOSE REDUCTION: This exam was performed according to the departmental dose-optimization program which includes automated exposure control, adjustment of the mA and/or kV according to patient size and/or use of iterative reconstruction technique. COMPARISON:  MRI head  07/30/2022 and CT head 07/29/2022 FINDINGS: CT HEAD FINDINGS Brain: Hypoattenuation within the periventricular white matter of the right parietal and occipital lobes is favored due to edema. This appears new since PET/CT 12/27/2022. Possible 2.9 x 3.1 cm circumscribed mass in the posterior right parietal lobe (series 2/image 18). This may be the cause of the edema. MRI is recommended for further evaluation. There is mass effect on the atrium and occipital horn of the right lateral ventricle. 4 mm of leftward midline shift at the septum pellucidum. The basal cisterns are patent. No hydrocephalus. No extra-axial fluid collection. Vascular: No hyperdense vessel or unexpected calcification. Skull: No acute fracture or destructive osseous lesion. Mucosal thickening in the maxillary sinuses. Sinuses/Orbits: No acute finding. Other: None. CT CERVICAL SPINE FINDINGS Alignment: No evidence of traumatic listhesis. Skull base and vertebrae: No acute fracture. No primary bone lesion or focal pathologic process. Soft tissues and spinal canal: No prevertebral fluid or swelling. No visible canal hematoma. Disc levels: Multilevel spondylosis, disc space height loss, and degenerative endplate changes greatest at C5-C6 where it is advanced. No severe spinal canal narrowing. Upper chest: No acute abnormality. Other: None. IMPRESSION: 1. Findings suspicious for metastasis in the posterior right parietal lobe with surrounding vasogenic edema. 4 mm of leftward midline shift. MRI with and without IV contrast is recommended for further evaluation. 2. No acute fracture in the cervical spine. Electronically Signed   By: Norman Gatlin M.D.   On: 03/06/2023 20:44   CT Cervical Spine Wo Contrast Result Date: 03/06/2023 CLINICAL DATA:  Blunt polytrauma, weakness EXAM: CT HEAD WITHOUT CONTRAST CT CERVICAL SPINE WITHOUT CONTRAST TECHNIQUE: Multidetector CT imaging of the head and cervical spine was performed following the standard protocol  without intravenous contrast. Multiplanar CT image reconstructions of the cervical spine were also generated. RADIATION DOSE REDUCTION: This exam was performed according to the departmental dose-optimization program which includes automated exposure control, adjustment of the mA and/or kV according to patient size and/or use of iterative reconstruction technique. COMPARISON:  MRI head 07/30/2022 and CT head 07/29/2022 FINDINGS: CT HEAD FINDINGS Brain: Hypoattenuation within the periventricular white matter of the right parietal and occipital lobes is favored due to edema. This appears new since PET/CT 12/27/2022. Possible 2.9 x 3.1 cm circumscribed mass in the posterior right parietal lobe (series 2/image 18). This may be the cause of the edema. MRI is recommended for further evaluation. There is mass effect on the atrium and occipital horn of the right lateral ventricle. 4 mm of leftward midline shift at the septum pellucidum. The basal cisterns are patent. No hydrocephalus. No extra-axial fluid collection. Vascular: No hyperdense vessel or unexpected calcification. Skull: No acute fracture or destructive osseous lesion. Mucosal thickening in the  maxillary sinuses. Sinuses/Orbits: No acute finding. Other: None. CT CERVICAL SPINE FINDINGS Alignment: No evidence of traumatic listhesis. Skull base and vertebrae: No acute fracture. No primary bone lesion or focal pathologic process. Soft tissues and spinal canal: No prevertebral fluid or swelling. No visible canal hematoma. Disc levels: Multilevel spondylosis, disc space height loss, and degenerative endplate changes greatest at C5-C6 where it is advanced. No severe spinal canal narrowing. Upper chest: No acute abnormality. Other: None. IMPRESSION: 1. Findings suspicious for metastasis in the posterior right parietal lobe with surrounding vasogenic edema. 4 mm of leftward midline shift. MRI with and without IV contrast is recommended for further evaluation. 2. No acute  fracture in the cervical spine. Electronically Signed   By: Norman Gatlin M.D.   On: 03/06/2023 20:44    ROS: As above Blood pressure 136/86, pulse (!) 54, temperature 97.7 F (36.5 C), temperature source Oral, resp. rate 16, SpO2 100%. Estimated body mass index is 28.76 kg/m as calculated from the following:   Height as of 12/30/22: 5' 7 (1.702 m).   Weight as of 01/28/23: 83.3 kg.  Physical Exam  General: An alert and pleasant left hemiparetic black male in no apparent distress  HEENT: Normocephalic, alopecia, extraocular muscles are intact, his pupils are equal.  Neck: Unremarkable  Thorax: Symmetric  Abdomen: Soft  Extremities: Unremarkable  Neurologic exam: The patient is alert and oriented x 3.  Cranial nerves II through XII were examined bilaterally grossly normal.  The patient is left hemiparetic with approximately 4/5 strength in his left upper and lower extremity.  His strength is normal on the right.  Cerebellar function is intact to rapid alternating movements of the upper extremities.  Sensory function is intact to light touch sensation all tested dermatomes bilaterally.  I reviewed the patient's head CT and brain MRI performed at [facility] today.  It demonstrates a solitary 4 x 3 cm right parietal mass with mass effect and edema.  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  We have discussed the various treatment options including doing nothing, empiric radiation therapy and surgery.  I have described the surgical treatment option of a right parietal craniotomy for resection of the tumor.  We discussed the risk including risk of anesthesia, hemorrhage, infection, worsening hemiparasis/stroke, seizures, medical risk, etc.  I have answered all the patient's questions.  I will contact Dr. Onesimo, his oncologist to see if he feels that he is medically fit for surgery.  If not I would suggest palliative radiation  therapy.  Tyler [name] Budge 03/07/2023, 7:45 AM

## 2023-03-07 NOTE — Assessment & Plan Note (Signed)
 History of acute gastric ulcer on EGD June 2024 Not currently on PPIs

## 2023-03-08 DIAGNOSIS — D496 Neoplasm of unspecified behavior of brain: Secondary | ICD-10-CM | POA: Diagnosis not present

## 2023-03-08 NOTE — Evaluation (Signed)
 Physical Therapy Evaluation Patient Details Name: Tyler [name] MRN: [id number] DOB: 28-Jan-1951 Today's Date: 03/08/2023  History of Present Illness  73 year old gentleman presented to the hospital with a few weeks of episodic weakness on the left upper and lower extremity and recent falls. W/U revealed right parietal tumor with vasogenic edema and 4 mm right-to-left shift. Seen by neurosurgery and oncology with tentative plan for surgical resection. Currently remains on high-dose dexamethasone . 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  Patient limited by decreased balance, generalized weakness, cognitive deficits and left inattention.  Depending on course of treatment and progress, patient may benefit from intensive inpatient f/u therapy > 3 hours/day.          If plan is discharge home, recommend the following: A little help with walking and/or transfers;A little help with bathing/dressing/bathroom;Assistance with cooking/housework;Direct supervision/assist for medications management;Direct supervision/assist for financial management;Assist for transportation;Help with stairs or ramp for entrance;Supervision due to cognitive status   Can travel by private vehicle        Equipment Recommendations Other (comment) (tbd)  Recommendations for Other Services  Rehab consult    Functional Status Assessment Patient has had a recent decline in their functional status and demonstrates the ability to make significant improvements in function in a reasonable and predictable amount of time.     Precautions / Restrictions Precautions Precautions: Fall      Mobility  Bed Mobility Overal bed mobility: Needs Assistance Bed Mobility: Supine to Sit, Sit to  Supine     Supine to sit: Contact guard Sit to supine: Contact guard assist   General bed mobility comments: patient moved toward edge of bed but left leg remained in bed.  WHen going sit to supine, patient required cueing to bring left LE into bed.    Transfers Overall transfer level: Needs assistance Equipment used: 1 person hand held assist Transfers: Sit to/from Stand Sit to Stand: Contact guard assist           General transfer comment: Patient transferred sit to stand leaving LLE in bed and dragging it out of bed once standing.    Ambulation/Gait Ambulation/Gait assistance: Min assist Gait Distance (Feet): 200 Feet Assistive device: 1 person hand held assist Gait Pattern/deviations: Step-through pattern, Decreased stride length, Decreased weight shift to left Gait velocity: decreased     General Gait Details: Pt very impulsive during gait, tried using RW but patient would not put both hands on RW.  Then tried +1 hand held assist, did better but grabbing for railing and reaching for chairs, etc with RUE.  Attempted to get paitent to turn around several times but he would not.  Required increased cues to find room.  Stairs            Wheelchair Mobility     Tilt Bed    Modified Rankin (Stroke Patients Only)       Balance Overall balance assessment: Needs assistance         Standing balance support: Single extremity supported, During functional activity Standing balance-Leahy Scale: Fair                               Pertinent Vitals/Pain Pain Assessment Pain Assessment: No/denies pain    Home Living  Family/patient expects to be discharged to:: Private residence Living Arrangements: Spouse/significant other Available Help at Discharge: Family Type of Home: Apartment Home Access: Stairs to enter Entrance Stairs-Rails: Doctor, General Practice of Steps: flight   Home Layout: One level Home Equipment: Agricultural Consultant (2  wheels);BSC/3in1      Prior Function Prior Level of Function :  (unable to determine - tangential conversation during sessoin)             Mobility Comments: Per chart review (from 6 mos ago), Independent to Mod I with occasional use of RW.       Extremity/Trunk Assessment        Lower Extremity Assessment Lower Extremity Assessment: LLE deficits/detail LLE Deficits / Details: patient tended to drag LLE, decreased dorsiflexion noted during gait.  Unable to follow commands consistently enough to fully test.       Communication   Communication Communication: Other (comment) (chart states to use interpreter but when I tried, patient spoke to me in English and did not wait for interpreter.) Cueing Techniques: Gestural cues;Tactile cues  Cognition Arousal: Alert Behavior During Therapy: Impulsive Overall Cognitive Status: No family/caregiver present to determine baseline cognitive functioning                                 General Comments: Tangential conversaton throughout session; followed commands inconsistently, very impulsive during session.        General Comments      Exercises     Assessment/Plan    PT Assessment Patient needs continued PT services  PT Problem List Decreased strength;Decreased activity tolerance;Decreased mobility;Decreased balance;Decreased cognition;Decreased knowledge of use of DME;Decreased safety awareness       PT Treatment Interventions DME instruction;Gait training;Therapeutic exercise;Therapeutic activities;Functional mobility training;Balance training;Neuromuscular re-education;Cognitive remediation;Patient/family education    PT Goals (Current goals can be found in the Care Plan section)  Acute Rehab PT Goals Patient Stated Goal: none stated PT Goal Formulation: With patient Time For Goal Achievement: 03/22/23 Potential to Achieve Goals: Good    Frequency Min 1X/week     Co-evaluation                AM-PAC PT 6 Clicks Mobility  Outcome Measure Help needed turning from your back to your side while in a flat bed without using bedrails?: A Little Help needed moving from lying on your back to sitting on the side of a flat bed without using bedrails?: A Little Help needed moving to and from a bed to a chair (including a wheelchair)?: A Little Help needed standing up from a chair using your arms (e.g., wheelchair or bedside chair)?: A Little Help needed to walk in hospital room?: A Little Help needed climbing 3-5 steps with a railing? : A Little 6 Click Score: 18    End of Session Equipment Utilized During Treatment: Gait belt Activity Tolerance: Patient tolerated treatment well Patient left: in bed;with bed alarm set   PT Visit Diagnosis: Unsteadiness on feet (R26.81);Other abnormalities of gait and mobility (R26.89);Repeated falls (R29.6);Muscle weakness (generalized) (M62.81);Other symptoms and signs involving the nervous system (R29.898)    Time: 8484-8444 PT Time Calculation (min) (ACUTE ONLY): 40 min   Charges:   PT Evaluation $PT Eval Moderate Complexity: 1 Mod PT Treatments $Therapeutic Activity: 8-22 mins PT General Charges $$ ACUTE PT VISIT: 1 Visit        03/08/2023 [place], PT Acute Rehabilitation Services Office:  [phone]  Sharma Lawrance M 03/08/2023, 4:11 PM

## 2023-03-08 NOTE — Progress Notes (Signed)
 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 Patient tells me that it is on and off weakness and he had fallen many times. At the emergency room he had no focal neurological deficit, however he was found to have right parietal tumor with vasogenic edema and 4 mm right-to-left shift. Seen by neurosurgery and oncology with tentative plan for surgical resection. Currently remains on high-dose dexamethasone .   Subjective: Patient seen and examined.  He does have some language barrier, however speaks limited English and understands.  He was worried about having to have brain surgery.  He was asking whether it will be okay for him.  I called and discussed with his daughter who is a family publishing rights manager. Patient was slightly impulsive yesterday, trying to get out of the bed.  Some of that is from hide his steroids and also related to brain tumor.  Today he is telling me that he had poor sleep and anxiety but otherwise feels better.  He tells me today that he feels no weakness of the left arm and leg. 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  Continue on dexamethasone .  Start mobilizing with PT OT.  Seen by neurosurgery and oncology. CT scan of the chest abdomen pelvis did not show any recurrence of the previous lymphoma.  Oncology recommending surgical resection.  Team will likely get back together Monday to discuss about tentative surgery.  Updated patient's family.  Sinus bradycardia: Currently stable.  Metoprolol   discontinued.  Chronic hep B: On tenofovir .  Paroxysmal A-fib: Currently in sinus rhythm.  Not anticoagulated due to high risk of bleeding.   DVT prophylaxis: enoxaparin  (LOVENOX ) injection 40 mg Start: 03/07/23 1800   Code Status: Full code Family Communication: Daughter on the phone.  She is a publishing rights manager. Disposition Plan: Status is: Inpatient Remains inpatient appropriate because: Inpatient procedures planned     Consultants:  Neurosurgery Oncology  Procedures:  None  Antimicrobials:  None     Objective: Vitals:   03/07/23 1549 03/07/23 1958 03/07/23 1959 03/08/23 0536  BP: (!) 174/98 (!) 144/88 (!) 144/88 (!) 141/91  Pulse: 99 90 90 72  Resp: 16 18 18 18   Temp: 97.6 F (36.4 C) 98.1 F (36.7 C) 98.1 F (36.7 C) 98.3 F (36.8 C)  TempSrc: Oral Oral Oral Oral  SpO2: 96% 92% 92% 98%    Intake/Output Summary (Last 24 hours) at 03/08/2023 1454 Last data filed at 03/08/2023 0600 Gross per 24 hour  Intake 225 ml  Output 925 ml  Net -700 ml   There were no vitals filed for this visit.  Examination:  General exam: Appears calm and comfortable. Respiratory system: Clear to auscultation. Respiratory effort normal. Cardiovascular system: S1 & S2 heard, RRR. Gastrointestinal system: Soft and nontender. Central nervous system: Alert and awake.  He had slight impulsiveness and anxiety but able to be reoriented.  Moves all extremities.    Data Reviewed: I have personally reviewed following labs and imaging studies  CBC: Recent Labs  Lab 03/06/23 2020  WBC 4.6  HGB 14.6  HCT 42.7  MCV 90.5  PLT 160   Basic Metabolic Panel: Recent Labs  Lab 03/06/23 2020  NA 140  K 3.6  CL 105  CO2 25  GLUCOSE 116*  BUN 9  CREATININE 0.88  CALCIUM  9.1   GFR: CrCl cannot be calculated (Unknown ideal weight.). Liver Function Tests: No results for input(s): AST, ALT, ALKPHOS, BILITOT, PROT, ALBUMIN  in the last 168 hours. No results for  input(s): LIPASE, AMYLASE in the last 168 hours. No results for input(s): AMMONIA in the last 168 hours. Coagulation Profile: No results for input(s): INR, PROTIME in the last 168 hours. Cardiac Enzymes: No results for input(s): CKTOTAL, CKMB, CKMBINDEX, TROPONINI in the last 168 hours. BNP (last 3 results) No results for input(s): PROBNP in the last 8760 hours. HbA1C: No results for input(s): HGBA1C in the last 72 hours. CBG: No results for input(s): GLUCAP in the last 168 hours. Lipid Profile: No results for input(s): CHOL, HDL, LDLCALC, TRIG, CHOLHDL, LDLDIRECT in the last 72 hours. Thyroid Function Tests: No results for input(s): TSH, T4TOTAL, FREET4, T3FREE, THYROIDAB in the last 72 hours. Anemia Panel: No results for input(s): VITAMINB12, FOLATE, FERRITIN, TIBC, IRON, RETICCTPCT in the last 72 hours. Sepsis Labs: No results for input(s): PROCALCITON, LATICACIDVEN in the last 168 hours.  No results found for this or any previous visit (from the past 240 hours).       Radiology Studies: CT ABDOMEN PELVIS W CONTRAST Result Date: 03/07/2023 CLINICAL DATA:  Metastatic disease evaluation. History of diffuse large B-cell lymphoma. * Tracking Code: BO * EXAM: CT ABDOMEN AND PELVIS WITH CONTRAST TECHNIQUE: Multidetector CT imaging of the abdomen and pelvis was performed using the standard protocol following bolus administration of intravenous contrast. RADIATION DOSE REDUCTION: This exam was performed according to the departmental dose-optimization program which includes automated exposure control, adjustment of the mA and/or kV according to patient size and/or use of iterative reconstruction technique. CONTRAST:  75mL OMNIPAQUE  IOHEXOL  350 MG/ML SOLN COMPARISON:  CT scan nuclear medicine PET-CT scan from 12/27/2022. FINDINGS: Lower chest: There are patchy atelectatic changes in the visualized lung bases. No overt consolidation.  No pleural effusion. The heart is normal in size. No pericardial effusion. Hepatobiliary: The liver is normal in size. Non-cirrhotic configuration. No suspicious mass. There are at least 2, subcentimeter hypoattenuating foci in the liver, which are too small to adequately characterize. No intrahepatic or extrahepatic bile duct dilation. No calcified gallstones. Normal gallbladder wall thickness. No pericholecystic inflammatory changes. Pancreas: Small/atrophic pancreas. No peripancreatic fat stranding. No discrete mass seen. Main pancreatic duct is not dilated. Spleen: Within normal limits. No focal lesion. Adrenals/Urinary Tract: Adrenal glands are unremarkable. No suspicious renal mass. There are multiple bilateral simple renal cysts with largest arising from the right kidney lower pole, laterally measuring up to 2.8 x 3.3 cm. No nephroureterolithiasis or obstructive uropathy. Unremarkable urinary bladder. Stomach/Bowel: No disproportionate dilation of the small or large bowel loops. No evidence of abnormal bowel wall thickening or inflammatory changes. The appendix is unremarkable. There are scattered diverticula mainly in the sigmoid colon, without imaging signs of diverticulitis. Vascular/Lymphatic: No ascites or pneumoperitoneum. No abdominal or pelvic lymphadenopathy, by size criteria. No aneurysmal dilation of the major abdominal arteries. There are mild peripheral atherosclerotic vascular calcifications of the aorta and its major branches. Reproductive: Patient is status post surgical removal of prostate and bilateral seminal vesicles. Other: There are postsurgical changes along the midline inferior anterior abdominal wall. There are bilateral small fat containing inguinal hernias. Musculoskeletal: No suspicious osseous  lesions. There are mild multilevel degenerative changes in the visualized spine. Redemonstration of mild loss of height of L4 vertebral body. IMPRESSION: 1. Essentially stable exam when  compared to recent PET-CT scan from 12/27/2022. No new lymphadenopathy by size criteria. 2. Multiple other nonacute observations, as described above. Electronically Signed   By: Ree Molt M.D.   On: 03/07/2023 16:36   MR Brain W and Wo Contrast Result Date: 03/07/2023 CLINICAL DATA:  Initial evaluation for brain/CNS neoplasm. EXAM: MRI HEAD WITHOUT AND WITH CONTRAST TECHNIQUE: Multiplanar, multiecho pulse sequences of the brain and surrounding structures were obtained without and with intravenous contrast. CONTRAST:  8mL GADAVIST  GADOBUTROL  1 MMOL/ML IV SOLN COMPARISON:  CT from earlier the same day as well as previous MRI from 07/30/2022. FINDINGS: Brain: Age-related cerebral atrophy. Patchy T2/FLAIR hyperintensity involving the periventricular and deep white matter both cerebral hemispheres, most likely related to chronic microvascular ischemic disease. No acute or subacute infarct. Heterogeneous enhancing mass positioned at the right parietal lobe measures 4.1 x 3.1 x 3.1 cm (series 17, image 45). Associated susceptibility artifact consistent with necrosis and/or blood products. Surrounding T2/FLAIR signal intensity throughout the adjacent right cerebral hemisphere, consistent with vasogenic edema. Effacement of the posterior right lateral ventricle. Asymmetric dilatation of the right temporal horn consistent with a degree of ventricular trapping. No visible transependymal flow of CSF. Associated 4 mm of right-to-left shift. No other mass lesion or abnormal enhancement. No other acute or chronic intracranial blood products. Pituitary gland and suprasellar region within normal limits. Vascular: Major intracranial vascular flow voids are maintained. Skull and upper cervical spine: Craniocervical junction within normal limits. Bone marrow signal intensity normal. No scalp soft tissue abnormality. Sinuses/Orbits: Globes and orbital soft tissues within normal limits. Mild mucosal thickening present about the  ethmoidal air cells and maxillary sinuses. No significant mastoid effusion. Other: None. IMPRESSION: 1. 4.1 x 3.1 x 3.1 cm heterogeneous enhancing mass positioned at the right parietal lobe with surrounding vasogenic edema and 4 mm of right-to-left shift. Differential considerations include a solitary intracranial metastasis versus primary CNS neoplasm. 2. Underlying age-related cerebral atrophy with chronic small vessel ischemic disease. Electronically Signed   By: Morene Hoard M.D.   On: 03/07/2023 04:19   CT Head Wo Contrast Result Date: 03/06/2023 CLINICAL DATA:  Blunt polytrauma, weakness EXAM: CT HEAD WITHOUT CONTRAST CT CERVICAL SPINE WITHOUT CONTRAST TECHNIQUE: Multidetector CT imaging of the head and cervical spine was performed following the standard protocol without intravenous contrast. Multiplanar CT image reconstructions of the cervical spine were also generated. RADIATION DOSE REDUCTION: This exam was performed according to the departmental dose-optimization program which includes automated exposure control, adjustment of the mA and/or kV according to patient size and/or use of iterative reconstruction technique. COMPARISON:  MRI head 07/30/2022 and CT head 07/29/2022 FINDINGS: CT HEAD FINDINGS Brain: Hypoattenuation within the periventricular white matter of the right parietal and occipital lobes is favored due to edema. This appears new since PET/CT 12/27/2022. Possible 2.9 x 3.1 cm circumscribed mass in the posterior right parietal lobe (series 2/image 18). This may be the cause of the edema. MRI is recommended for further evaluation. There is mass effect on the atrium and occipital horn of the right lateral ventricle. 4 mm of leftward midline shift at the septum pellucidum. The basal cisterns are patent. No hydrocephalus. No extra-axial fluid collection. Vascular: No hyperdense vessel or unexpected calcification. Skull: No acute fracture or destructive osseous lesion. Mucosal thickening  in the maxillary sinuses. Sinuses/Orbits: No acute finding. Other:  None. CT CERVICAL SPINE FINDINGS Alignment: No evidence of traumatic listhesis. Skull base and vertebrae: No acute fracture. No primary bone lesion or focal pathologic process. Soft tissues and spinal canal: No prevertebral fluid or swelling. No visible canal hematoma. Disc levels: Multilevel spondylosis, disc space height loss, and degenerative endplate changes greatest at C5-C6 where it is advanced. No severe spinal canal narrowing. Upper chest: No acute abnormality. Other: None. IMPRESSION: 1. Findings suspicious for metastasis in the posterior right parietal lobe with surrounding vasogenic edema. 4 mm of leftward midline shift. MRI with and without IV contrast is recommended for further evaluation. 2. No acute fracture in the cervical spine. Electronically Signed   By: Norman Gatlin M.D.   On: 03/06/2023 20:44   CT Cervical Spine Wo Contrast Result Date: 03/06/2023 CLINICAL DATA:  Blunt polytrauma, weakness EXAM: CT HEAD WITHOUT CONTRAST CT CERVICAL SPINE WITHOUT CONTRAST TECHNIQUE: Multidetector CT imaging of the head and cervical spine was performed following the standard protocol without intravenous contrast. Multiplanar CT image reconstructions of the cervical spine were also generated. RADIATION DOSE REDUCTION: This exam was performed according to the departmental dose-optimization program which includes automated exposure control, adjustment of the mA and/or kV according to patient size and/or use of iterative reconstruction technique. COMPARISON:  MRI head 07/30/2022 and CT head 07/29/2022 FINDINGS: CT HEAD FINDINGS Brain: Hypoattenuation within the periventricular white matter of the right parietal and occipital lobes is favored due to edema. This appears new since PET/CT 12/27/2022. Possible 2.9 x 3.1 cm circumscribed mass in the posterior right parietal lobe (series 2/image 18). This may be the cause of the edema. MRI is recommended  for further evaluation. There is mass effect on the atrium and occipital horn of the right lateral ventricle. 4 mm of leftward midline shift at the septum pellucidum. The basal cisterns are patent. No hydrocephalus. No extra-axial fluid collection. Vascular: No hyperdense vessel or unexpected calcification. Skull: No acute fracture or destructive osseous lesion. Mucosal thickening in the maxillary sinuses. Sinuses/Orbits: No acute finding. Other: None. CT CERVICAL SPINE FINDINGS Alignment: No evidence of traumatic listhesis. Skull base and vertebrae: No acute fracture. No primary bone lesion or focal pathologic process. Soft tissues and spinal canal: No prevertebral fluid or swelling. No visible canal hematoma. Disc levels: Multilevel spondylosis, disc space height loss, and degenerative endplate changes greatest at C5-C6 where it is advanced. No severe spinal canal narrowing. Upper chest: No acute abnormality. Other: None. IMPRESSION: 1. Findings suspicious for metastasis in the posterior right parietal lobe with surrounding vasogenic edema. 4 mm of leftward midline shift. MRI with and without IV contrast is recommended for further evaluation. 2. No acute fracture in the cervical spine. Electronically Signed   By: Norman Gatlin M.D.   On: 03/06/2023 20:44        Scheduled Meds:  dexamethasone  (DECADRON ) injection  8 mg Intravenous Q12H   enoxaparin  (LOVENOX ) injection  40 mg Subcutaneous Q24H   pantoprazole   40 mg Oral BID   tenofovir  alafenamide  25 mg Oral Daily   Continuous Infusions:   LOS: 1 day    Time spent: 40 minutes    Renato Applebaum, MD [facility]

## 2023-03-08 NOTE — Plan of Care (Signed)
  Problem: Education: Goal: Knowledge of General Education information will improve Description: Including pain rating scale, medication(s)/side effects and non-pharmacologic comfort measures Outcome: Progressing   Problem: Nutrition: Goal: Adequate nutrition will be maintained Outcome: Progressing   Problem: Elimination: Goal: Will not experience complications related to bowel motility Outcome: Progressing   Problem: Pain Management: Goal: General experience of comfort will improve Outcome: Progressing

## 2023-03-09 DIAGNOSIS — G8194 Hemiplegia, unspecified affecting left nondominant side: Secondary | ICD-10-CM

## 2023-03-09 DIAGNOSIS — D496 Neoplasm of unspecified behavior of brain: Secondary | ICD-10-CM | POA: Diagnosis not present

## 2023-03-09 NOTE — Progress Notes (Signed)
 Inpatient Rehab Admissions Coordinator:   Per therapy recommendations,  patient was screened for CIR candidacy by Leita Kleine, MS, CCC-SLP  At this time, Pt. is not medically ready for CIR., as surgical resection is pending.  I will not pursue a rehab consult for this Pt. at this time, but CIR admissions team will follow and monitor for medical readiness and place consult order if Pt. appears to be an appropriate candidate. Please contact me with any questions.   Leita Kleine, MS, CCC-SLP Rehab Admissions Coordinator  [phone] (celll) [phone] (office)

## 2023-03-09 NOTE — Progress Notes (Addendum)
 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 Patient tells me that it is on and off weakness and he had fallen many times. At the emergency room he had no focal neurological deficit, however he was found to have right parietal tumor with vasogenic edema and 4 mm right-to-left shift. Seen by neurosurgery and oncology with tentative plan for surgical resection. Currently remains on high-dose dexamethasone .    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  On high-dose dexamethasone  and we are continuing this. Start mobilizing with PT OT.  Seen by neurosurgery and oncology. CT scan of the chest abdomen pelvis did not show any recurrence of the previous lymphoma.  Oncology recommending surgical resection.  Team will likely get back together Monday to discuss about tentative surgery.     Sinus bradycardia: Currently stable.  Metoprolol  discontinued.  Blood pressure a little higher today.  No anginal symptoms after beta-blocker stopped yesterday.   Chronic hep B: On tenofovir .   Paroxysmal A-fib: Currently in sinus rhythm.  Not anticoagulated due to high risk of bleeding.  Heart rate has been.   DVT prophylaxis:  enoxaparin  (LOVENOX ) injection 40 mg Start: 03/07/23 1800  Code Status:   Code Status: Full Code Level of care: Telemetry Medical Status is: Inpatient Remains inpatient appropriate because: Inpatient procedures planned   Consults called: Neurosurgery/oncology  Subjective: Patient awake and alert today.   Feels well.  Notes that the strength in his right hand waxes and wanes.  He is a left-handed individual.  Slowly has some strength returning but mostly still very weak and left hand and left leg/foot.  Objective: Vitals:   03/08/23 1609 03/08/23 2100 03/09/23 0502 03/09/23 0725  BP: (!) 165/98 (!) 157/101 (!) 140/78 (!) 171/98  Pulse: 67 78 61 61  Resp: 18 17 18 18   Temp: (!) 97.5 F (36.4 C) 98.4 F (36.9 C) 98.6 F (37 C) 98.1 F (36.7 C)  TempSrc: Oral Oral    SpO2: 98% 94% 91% 96%    Intake/Output Summary (Last 24 hours) at 03/09/2023 1526 Last data filed at 03/09/2023 1300 Gross per 24 hour  Intake 1200 ml  Output 250 ml  Net 950 ml   There were no vitals filed for this visit. There is no height or weight on file to calculate BMI.  Gen: 73 y.o. male in no apparent distress.  Nontoxic Pulm: Non-labored breathing.  Clear to auscultation bilaterally.  CV: Regular rate and rhythm. No murmur, rub, or gallop. No JVD GI: Abdomen soft, non-tender, non-distended, with normoactive bowel sounds. No organomegaly or masses felt. Ext: Warm, no deformities, no pedal edema Skin: No rashes, lesions no ulcers Neuro: Alert and oriented. Alert and oriented.  With strength 4 out of 5 handgrip on the right.  2 out of 5 in grip on the left.  Strength is 5 out of 5 in lower extremities on the right.  It is 2 out of 5 lower extremity on the left.  Confluent.  No cranial nerve deficits noted on exam today. Psych: Calm  Judgement and insight appear normal. Mood & affect appropriate.  Very pleasant, we discussed his life back in [place]   I have personally reviewed the following labs and images: CBC: Recent Labs  Lab 03/06/23 2020  WBC 4.6  HGB 14.6  HCT 42.7  MCV 90.5  PLT 160   BMP &GFR Recent Labs  Lab 03/06/23 2020  NA 140  K 3.6  CL 105  CO2 25  GLUCOSE 116*  BUN 9  CREATININE 0.88  CALCIUM  9.1   CrCl cannot be calculated (Unknown ideal weight.). Liver & Pancreas: No results  for input(s): AST, ALT, ALKPHOS, BILITOT, PROT, ALBUMIN  in the last 168 hours. No results for input(s): LIPASE, AMYLASE in the last 168 hours. No results for input(s): AMMONIA in the last 168 hours. Diabetic: No results for input(s): HGBA1C in the last 72 hours. No results for input(s): GLUCAP in the last 168 hours. Cardiac Enzymes: No results for input(s): CKTOTAL, CKMB, CKMBINDEX, TROPONINI in the last 168 hours. No results for input(s): PROBNP in the last 8760 hours. Coagulation Profile: No results for input(s): INR, PROTIME in the last 168 hours. Thyroid Function Tests: No results for input(s): TSH, T4TOTAL, FREET4, T3FREE, THYROIDAB in the last 72 hours. Lipid Profile: No results for input(s): CHOL, HDL, LDLCALC, TRIG, CHOLHDL, LDLDIRECT in the last 72 hours. Anemia Panel: No results for input(s): VITAMINB12, FOLATE, FERRITIN, TIBC, IRON, RETICCTPCT in the last 72 hours. Urine analysis:    Component Value Date/Time   COLORURINE YELLOW 03/06/2023 2024   APPEARANCEUR HAZY (A) 03/06/2023 2024   LABSPEC 1.009 03/06/2023 2024   PHURINE 5.0 03/06/2023 2024   GLUCOSEU NEGATIVE 03/06/2023 2024   HGBUR NEGATIVE 03/06/2023 2024   BILIRUBINUR NEGATIVE 03/06/2023 2024   KETONESUR NEGATIVE 03/06/2023 2024   PROTEINUR NEGATIVE 03/06/2023 2024   NITRITE NEGATIVE 03/06/2023 2024   LEUKOCYTESUR NEGATIVE 03/06/2023 2024   Sepsis Labs: Invalid input(s): PROCALCITONIN, LACTICIDVEN  Microbiology: No results found for this or any previous visit (from the past 240 hours).  Radiology Studies: No results found.  Scheduled Meds:  dexamethasone  (DECADRON ) injection  8 mg Intravenous Q12H   enoxaparin  (LOVENOX ) injection  40 mg Subcutaneous Q24H   pantoprazole   40 mg Oral BID   tenofovir  alafenamide  25 mg Oral Daily   Continuous Infusions:   LOS: 2 days   55 minutes with more than 50% spent in reviewing records,  counseling patient/family and coordinating care.  Tyler VEAR Gaw, MD [facility] [email or web address] 03/09/2023, 3:26 PM

## 2023-03-09 NOTE — Plan of Care (Signed)
  Problem: Education: Goal: Knowledge of General Education information will improve Description: Including pain rating scale, medication(s)/side effects and non-pharmacologic comfort measures Outcome: Progressing   Problem: Health Behavior/Discharge Planning: Goal: Ability to manage health-related needs will improve Outcome: Progressing   Problem: Clinical Measurements: Goal: Ability to maintain clinical measurements within normal limits will improve Outcome: Progressing Goal: Will remain free from infection Outcome: Progressing   Problem: Nutrition: Goal: Adequate nutrition will be maintained Outcome: Progressing   Problem: Elimination: Goal: Will not experience complications related to bowel motility Outcome: Progressing   Problem: Coping: Goal: Level of anxiety will decrease Outcome: Progressing

## 2023-03-09 NOTE — Evaluation (Signed)
 Occupational Therapy Evaluation Patient Details Name: Tyler [name] MRN: [id number] DOB: 24-Oct-1950 Today's Date: 03/09/2023   History of Present Illness 73 year old gentleman presented to the hospital with a few weeks of episodic weakness on the left upper and lower extremity and recent falls. W/U revealed right parietal tumor with vasogenic edema and 4 mm right-to-left shift. Seen by neurosurgery and oncology with tentative plan for surgical resection. Currently remains on high-dose dexamethasone . 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  PTA he lives at home with his spouse, who is able to assist as needed.   Patient exhibits safety concerns, inattention to his L side, mixed tone to his L hemi-body, and poor dynamic balance.  OT will continue efforts in the acute setting to address deficits.  Patient will benefit from intensive inpatient follow up therapy, >3 hours/day.  Patient is scheduled for tumor resection soon, and will need to be reassessed for functional changes, and possible changes to disposition.         If plan is discharge home, recommend the following: A little help with walking and/or transfers;A little help with bathing/dressing/bathroom;Help with stairs or ramp for entrance;Assist for transportation;Assistance with cooking/housework;Supervision due to cognitive status    Functional Status Assessment  Patient has had a recent decline in their functional status and demonstrates the ability to make significant improvements in function in a reasonable and predictable amount of time.  Equipment Recommendations  None recommended by OT    Recommendations for Other Services       Precautions / Restrictions Precautions Precautions:  Fall Restrictions Weight Bearing Restrictions Per Provider Order: No      Mobility Bed Mobility   Bed Mobility: Supine to Sit     Supine to sit: Contact guard          Transfers Overall transfer level: Needs assistance Equipment used: 1 person hand held assist Transfers: Sit to/from Stand Sit to Stand: Contact guard assist                  Balance Overall balance assessment: Needs assistance Sitting-balance support: Feet supported Sitting balance-Leahy Scale: Good     Standing balance support: Single extremity supported, During functional activity Standing balance-Leahy Scale: Fair                             ADL either performed or assessed with clinical judgement   ADL Overall ADL's : Needs assistance/impaired Eating/Feeding: Set up;Sitting   Grooming: Wash/dry hands;Wash/dry face;Oral care;Contact guard assist;Standing   Upper Body Bathing: Supervision/ safety;Sitting   Lower Body Bathing: Contact guard assist;Sit to/from stand   Upper Body Dressing : Standing;Contact guard assist   Lower Body Dressing: Contact guard assist;Sit to/from stand   Toilet Transfer: Contact guard assist;Ambulation;Regular Toilet                   Vision   Vision Assessment?: No apparent visual deficits;Vision impaired- to be further tested in functional context Additional Comments: Patient was scanning L and no assist locating objects to the L visual field.     Perception Perception: Impaired Preception Impairment Details: Inattention/Neglect Perception-Other Comments: Left   Praxis Praxis: WFL       Pertinent Vitals/Pain Pain Assessment Pain Assessment: Faces Faces Pain Scale: Hurts  a little bit Pain Location: Headache Pain Descriptors / Indicators: Aching, Pressure Pain Intervention(s): Monitored during session     Extremity/Trunk Assessment Upper Extremity Assessment Upper Extremity Assessment: Right hand dominant;LUE  deficits/detail LUE Deficits / Details: Increased tone noted at times, but able to break out and use the L UE functionally.  Dropping item with L hand. LUE Sensation: WNL LUE Coordination: decreased fine motor;decreased gross motor   Lower Extremity Assessment Lower Extremity Assessment: Defer to PT evaluation       Communication Communication Communication: No apparent difficulties   Cognition Arousal: Alert Behavior During Therapy: Impulsive Overall Cognitive Status: No family/caregiver present to determine baseline cognitive functioning Area of Impairment: Orientation, Following commands, Safety/judgement, Awareness                 Orientation Level: Disoriented to, Time     Following Commands: Follows one step commands inconsistently, Follows one step commands with increased time Safety/Judgement: Decreased awareness of safety, Decreased awareness of deficits Awareness: Emergent         General Comments   VSS on RA    Exercises     Shoulder Instructions      Home Living Family/patient expects to be discharged to:: Private residence Living Arrangements: Spouse/significant other Available Help at Discharge: Family Type of Home: Apartment Home Access: Stairs to enter Secretary/administrator of Steps: flight Entrance Stairs-Rails: Right;Left Home Layout: One level     Bathroom Shower/Tub: Chief Strategy Officer: Standard Bathroom Accessibility: Yes   Home Equipment: Agricultural Consultant (2 wheels);BSC/3in1          Prior Functioning/Environment               Mobility Comments: Per chart review (from 6 mos ago), Independent to Mod I with occasional use of RW. ADLs Comments: Independent to Mod I with occasional assistance from spouse.        OT Problem List: Decreased strength;Decreased activity tolerance;Impaired balance (sitting and/or standing);Pain;Decreased safety awareness      OT Treatment/Interventions: Self-care/ADL  training;Therapeutic activities;Patient/family education;Balance training;DME and/or AE instruction    OT Goals(Current goals can be found in the care plan section) Acute Rehab OT Goals Patient Stated Goal: Return home OT Goal Formulation: With patient Time For Goal Achievement: 03/24/23 Potential to Achieve Goals: Good ADL Goals Pt Will Perform Grooming: Independently Pt Will Perform Lower Body Dressing: Independently;sit to/from stand Pt Will Transfer to Toilet: Independently;regular height toilet;ambulating  OT Frequency: Min 1X/week    Co-evaluation              AM-PAC OT 6 Clicks Daily Activity     Outcome Measure Help from another person eating meals?: None Help from another person taking care of personal grooming?: A Little Help from another person toileting, which includes using toliet, bedpan, or urinal?: A Little Help from another person bathing (including washing, rinsing, drying)?: A Little Help from another person to put on and taking off regular upper body clothing?: A Little Help from another person to put on and taking off regular lower body clothing?: A Little 6 Click Score: 19   End of Session Equipment Utilized During Treatment: Rolling walker (2 wheels) Nurse Communication: Mobility status  Activity Tolerance: Patient tolerated treatment well Patient left: in chair;with call bell/phone within reach;with chair alarm set  OT Visit Diagnosis: Unsteadiness on feet (R26.81);Muscle weakness (generalized) (M62.81);Other symptoms and signs involving cognitive function                Time: [phone]  OT Time Calculation (min): 22 min Charges:  OT General Charges $OT Visit: 1 Visit OT Evaluation $OT Eval Moderate Complexity: 1 Mod  03/09/2023  RP, OTR/L  Acute Rehabilitation Services  Office:  [phone]   Charlie JONETTA Halsted 03/09/2023, 1:19 PM

## 2023-03-09 NOTE — Plan of Care (Signed)

## 2023-03-10 ENCOUNTER — Inpatient Hospital Stay (HOSPITAL_COMMUNITY): Payer: 59

## 2023-03-10 ENCOUNTER — Encounter (HOSPITAL_COMMUNITY): Payer: Self-pay | Admitting: Internal Medicine

## 2023-03-10 ENCOUNTER — Other Ambulatory Visit: Payer: Self-pay

## 2023-03-10 ENCOUNTER — Encounter (HOSPITAL_COMMUNITY)
Admission: EM | Disposition: A | Discharge status: Home / Self Care | Payer: Self-pay | Source: Home / Self Care | Attending: Neurosurgery

## 2023-03-10 DIAGNOSIS — I480 Paroxysmal atrial fibrillation: Secondary | ICD-10-CM

## 2023-03-10 DIAGNOSIS — G939 Disorder of brain, unspecified: Secondary | ICD-10-CM | POA: Diagnosis not present

## 2023-03-10 DIAGNOSIS — D496 Neoplasm of unspecified behavior of brain: Secondary | ICD-10-CM | POA: Diagnosis not present

## 2023-03-10 DIAGNOSIS — I10 Essential (primary) hypertension: Secondary | ICD-10-CM | POA: Diagnosis not present

## 2023-03-10 DIAGNOSIS — G8194 Hemiplegia, unspecified affecting left nondominant side: Secondary | ICD-10-CM | POA: Diagnosis not present

## 2023-03-10 HISTORY — PX: APPLICATION OF CRANIAL NAVIGATION: SHX6578

## 2023-03-10 HISTORY — PX: CRANIOTOMY: SHX93

## 2023-03-10 LAB — CBC
HCT: 43.5 % (ref 39.0–52.0)
Hemoglobin: 15.1 g/dL (ref 13.0–17.0)
MCH: 30.6 pg (ref 26.0–34.0)
MCHC: 34.7 g/dL (ref 30.0–36.0)
MCV: 88.1 fL (ref 80.0–100.0)
Platelets: 180 10*3/uL (ref 150–400)
RBC: 4.94 MIL/uL (ref 4.22–5.81)
RDW: 12.3 % (ref 11.5–15.5)
WBC: 6.8 10*3/uL (ref 4.0–10.5)
nRBC: 0 % (ref 0.0–0.2)

## 2023-03-10 LAB — PREPARE RBC (CROSSMATCH)

## 2023-03-10 LAB — BASIC METABOLIC PANEL
Anion gap: 10 (ref 5–15)
BUN: 23 mg/dL (ref 8–23)
CO2: 25 mmol/L (ref 22–32)
Calcium: 8.8 mg/dL — ABNORMAL LOW (ref 8.9–10.3)
Chloride: 103 mmol/L (ref 98–111)
Creatinine, Ser: 1.03 mg/dL (ref 0.61–1.24)
GFR, Estimated: >60 mL/min (ref >60)
Glucose, Bld: 124 mg/dL — ABNORMAL HIGH (ref 70–99)
Potassium: 3.9 mmol/L (ref 3.5–5.1)
Sodium: 138 mmol/L (ref 135–145)

## 2023-03-10 SURGERY — CRANIOTOMY TUMOR EXCISION
Anesthesia: General | Site: Head | Laterality: Right

## 2023-03-10 MED ORDER — BUPIVACAINE-EPINEPHRINE (PF) 0.5% -1:200000 IJ SOLN
INTRAMUSCULAR | Status: AC
  Filled 2023-03-10: qty 30

## 2023-03-10 MED ORDER — PHENYLEPHRINE HCL-NACL 20-0.9 MG/250ML-% IV SOLN
INTRAVENOUS | Status: DC | PRN
  Administered 2023-03-10 (×2): 80 ug via INTRAVENOUS
  Administered 2023-03-10: 30 ug/min via INTRAVENOUS
  Administered 2023-03-10: 80 ug via INTRAVENOUS

## 2023-03-10 MED ORDER — MORPHINE SULFATE (PF) 2 MG/ML IV SOLN
2.0000 mg | INTRAVENOUS | Status: DC | PRN
  Administered 2023-03-10 – 2023-03-11 (×3): 2 mg via INTRAVENOUS
  Filled 2023-03-10: qty 1
  Filled 2023-03-10: qty 2
  Filled 2023-03-10: qty 1

## 2023-03-10 MED ORDER — SODIUM CHLORIDE 0.9 % IV SOLN: 10.0000 mL/h | Freq: Once | INTRAVENOUS | Status: DC

## 2023-03-10 MED ORDER — SUCCINYLCHOLINE CHLORIDE 200 MG/10ML IV SOSY: PREFILLED_SYRINGE | INTRAVENOUS | Status: DC | PRN

## 2023-03-10 MED ORDER — FENTANYL CITRATE (PF) 100 MCG/2ML IJ SOLN: 25.0000 ug | INTRAMUSCULAR | Status: DC | PRN

## 2023-03-10 MED ORDER — MANNITOL 25 % IV SOLN
INTRAVENOUS | Status: DC | PRN
  Administered 2023-03-10: 12.5 g via INTRAVENOUS

## 2023-03-10 MED ORDER — LACTATED RINGERS IV SOLN: INTRAVENOUS | Status: DC | PRN

## 2023-03-10 MED ORDER — REMIFENTANIL HCL 2 MG IV SOLR
INTRAVENOUS | Status: AC
  Filled 2023-03-10: qty 2000

## 2023-03-10 MED ORDER — ACETAMINOPHEN 10 MG/ML IV SOLN
INTRAVENOUS | Status: AC
  Filled 2023-03-10: qty 100

## 2023-03-10 MED ORDER — LABETALOL HCL 5 MG/ML IV SOLN
INTRAVENOUS | Status: AC
  Filled 2023-03-10: qty 4

## 2023-03-10 MED ORDER — FENTANYL CITRATE (PF) 250 MCG/5ML IJ SOLN
INTRAMUSCULAR | Status: DC | PRN
  Administered 2023-03-10 (×2): 100 ug via INTRAVENOUS

## 2023-03-10 MED ORDER — BACITRACIN ZINC 500 UNIT/GM EX OINT
TOPICAL_OINTMENT | CUTANEOUS | Status: AC
  Filled 2023-03-10: qty 28.35

## 2023-03-10 MED ORDER — DOCUSATE SODIUM 100 MG PO CAPS
100.0000 mg | ORAL_CAPSULE | Freq: Two times a day (BID) | ORAL | Status: DC
  Administered 2023-03-10 – 2023-03-16 (×12): 100 mg via ORAL
  Filled 2023-03-10 (×13): qty 1

## 2023-03-10 MED ORDER — CEFAZOLIN SODIUM-DEXTROSE 2-4 GM/100ML-% IV SOLN
INTRAVENOUS | Status: AC
  Filled 2023-03-10: qty 100

## 2023-03-10 MED ORDER — LIDOCAINE 2% (20 MG/ML) 5 ML SYRINGE
INTRAMUSCULAR | Status: DC | PRN
  Administered 2023-03-10: 80 mg via INTRAVENOUS

## 2023-03-10 MED ORDER — LIDOCAINE 2% (20 MG/ML) 5 ML SYRINGE
INTRAMUSCULAR | Status: AC
  Filled 2023-03-10: qty 5

## 2023-03-10 MED ORDER — DEXAMETHASONE SODIUM PHOSPHATE 10 MG/ML IJ SOLN
INTRAMUSCULAR | Status: DC | PRN
  Administered 2023-03-10: 10 mg via INTRAVENOUS

## 2023-03-10 MED ORDER — CHLORHEXIDINE GLUCONATE 0.12 % MT SOLN
OROMUCOSAL | Status: AC
  Filled 2023-03-10: qty 15

## 2023-03-10 MED ORDER — FENTANYL CITRATE (PF) 250 MCG/5ML IJ SOLN
INTRAMUSCULAR | Status: AC
  Filled 2023-03-10: qty 5

## 2023-03-10 MED ORDER — ONDANSETRON HCL 4 MG/2ML IJ SOLN
INTRAMUSCULAR | Status: AC
  Filled 2023-03-10: qty 2

## 2023-03-10 MED ORDER — PROMETHAZINE HCL 25 MG PO TABS: 12.5000 mg | ORAL_TABLET | ORAL | Status: DC | PRN

## 2023-03-10 MED ORDER — THROMBIN 5000 UNITS EX SOLR
CUTANEOUS | Status: AC
  Filled 2023-03-10: qty 5000

## 2023-03-10 MED ORDER — ONDANSETRON HCL 4 MG/2ML IJ SOLN
INTRAMUSCULAR | Status: DC | PRN
  Administered 2023-03-10: 4 mg via INTRAVENOUS

## 2023-03-10 MED ORDER — THROMBIN 20000 UNITS EX SOLR
CUTANEOUS | Status: AC
  Filled 2023-03-10: qty 20000

## 2023-03-10 MED ORDER — ROCURONIUM BROMIDE 10 MG/ML (PF) SYRINGE
PREFILLED_SYRINGE | INTRAVENOUS | Status: DC | PRN
  Administered 2023-03-10: 70 mg via INTRAVENOUS
  Administered 2023-03-10: 20 mg via INTRAVENOUS

## 2023-03-10 MED ORDER — ACETAMINOPHEN 10 MG/ML IV SOLN
INTRAVENOUS | Status: DC | PRN
  Administered 2023-03-10: 1000 mg via INTRAVENOUS

## 2023-03-10 MED ORDER — SODIUM CHLORIDE 0.9 % IV SOLN
0.0125 ug/kg/min | INTRAVENOUS | Status: DC
  Administered 2023-03-10: .1 ug/kg/min via INTRAVENOUS
  Filled 2023-03-10 (×3): qty 2000

## 2023-03-10 MED ORDER — PROPOFOL 10 MG/ML IV BOLUS
INTRAVENOUS | Status: DC | PRN
  Administered 2023-03-10: 50 mg via INTRAVENOUS
  Administered 2023-03-10: 75 ug/kg/min via INTRAVENOUS
  Administered 2023-03-10: 40 mg via INTRAVENOUS
  Administered 2023-03-10: 50 mg via INTRAVENOUS
  Administered 2023-03-10: 100 mg via INTRAVENOUS
  Administered 2023-03-10: 60 mg via INTRAVENOUS

## 2023-03-10 MED ORDER — DROPERIDOL 2.5 MG/ML IJ SOLN
0.6250 mg | Freq: Once | INTRAMUSCULAR | Status: DC | PRN
Start: 2023-03-10 — End: 2023-03-10

## 2023-03-10 MED ORDER — PROPOFOL 10 MG/ML IV BOLUS
INTRAVENOUS | Status: AC
  Filled 2023-03-10: qty 20

## 2023-03-10 MED ORDER — GLYCOPYRROLATE 0.2 MG/ML IJ SOLN
INTRAMUSCULAR | Status: DC | PRN
  Administered 2023-03-10: .2 mg via INTRAVENOUS

## 2023-03-10 MED ORDER — CEFAZOLIN SODIUM-DEXTROSE 2-3 GM-%(50ML) IV SOLR
INTRAVENOUS | Status: DC | PRN
  Administered 2023-03-10: 2 g via INTRAVENOUS

## 2023-03-10 MED ORDER — LABETALOL HCL 5 MG/ML IV SOLN
10.0000 mg | Freq: Four times a day (QID) | INTRAVENOUS | Status: DC | PRN
  Administered 2023-03-10 – 2023-03-14 (×4): 10 mg via INTRAVENOUS
  Filled 2023-03-10 (×4): qty 4

## 2023-03-10 MED ORDER — 0.9 % SODIUM CHLORIDE (POUR BTL) OPTIME
TOPICAL | Status: DC | PRN
  Administered 2023-03-10: 3000 mL

## 2023-03-10 MED ORDER — LEVETIRACETAM IN NACL 500 MG/100ML IV SOLN
500.0000 mg | Freq: Two times a day (BID) | INTRAVENOUS | Status: DC
  Administered 2023-03-10 – 2023-03-13 (×6): 500 mg via INTRAVENOUS
  Filled 2023-03-10 (×7): qty 100

## 2023-03-10 MED ORDER — SODIUM CHLORIDE 0.9 % IV SOLN
INTRAVENOUS | Status: DC | PRN
  Administered 2023-03-10: 500 mg via INTRAVENOUS

## 2023-03-10 MED ORDER — SODIUM CHLORIDE 0.9 % IV SOLN: 10.0000 mL/h | Freq: Once | INTRAVENOUS | Status: AC

## 2023-03-10 MED ORDER — SODIUM CHLORIDE 0.9% IV SOLUTION: Freq: Once | INTRAVENOUS | Status: DC

## 2023-03-10 MED ORDER — DEXAMETHASONE SODIUM PHOSPHATE 10 MG/ML IJ SOLN
INTRAMUSCULAR | Status: AC
  Filled 2023-03-10: qty 1

## 2023-03-10 MED ORDER — ROCURONIUM BROMIDE 10 MG/ML (PF) SYRINGE
PREFILLED_SYRINGE | INTRAVENOUS | Status: AC
  Filled 2023-03-10: qty 10

## 2023-03-10 MED ORDER — ATROPINE SULFATE 0.4 MG/ML IV SOLN
INTRAVENOUS | Status: DC | PRN
  Administered 2023-03-10 (×2): .04 mg via INTRAVENOUS

## 2023-03-10 MED ORDER — SODIUM CHLORIDE 0.9 % IV SOLN: INTRAVENOUS | Status: DC

## 2023-03-10 MED ORDER — CEFAZOLIN SODIUM-DEXTROSE 2-4 GM/100ML-% IV SOLN
2.0000 g | Freq: Three times a day (TID) | INTRAVENOUS | Status: AC
  Administered 2023-03-10 – 2023-03-11 (×2): 2 g via INTRAVENOUS
  Filled 2023-03-10 (×2): qty 100

## 2023-03-10 MED ORDER — BUPIVACAINE-EPINEPHRINE 0.5% -1:200000 IJ SOLN
INTRAMUSCULAR | Status: DC | PRN
  Administered 2023-03-10: 10 mL

## 2023-03-10 MED ORDER — SUGAMMADEX SODIUM 200 MG/2ML IV SOLN
INTRAVENOUS | Status: DC | PRN
  Administered 2023-03-10: 200 mg via INTRAVENOUS

## 2023-03-10 MED ORDER — LABETALOL HCL 5 MG/ML IV SOLN
10.0000 mg | INTRAVENOUS | Status: DC | PRN
  Administered 2023-03-10: 10 mg via INTRAVENOUS

## 2023-03-10 MED ORDER — HYDRALAZINE HCL 20 MG/ML IJ SOLN
10.0000 mg | Freq: Four times a day (QID) | INTRAMUSCULAR | Status: DC | PRN
  Administered 2023-03-11 – 2023-03-13 (×2): 10 mg via INTRAVENOUS
  Filled 2023-03-10 (×2): qty 1

## 2023-03-10 MED ORDER — CHLORHEXIDINE GLUCONATE CLOTH 2 % EX PADS
6.0000 | MEDICATED_PAD | Freq: Every day | CUTANEOUS | Status: DC
  Administered 2023-03-11 – 2023-03-13 (×3): 6 via TOPICAL

## 2023-03-10 MED ORDER — METOPROLOL SUCCINATE ER 25 MG PO TB24
25.0000 mg | ORAL_TABLET | Freq: Every day | ORAL | Status: DC
  Administered 2023-03-10 – 2023-03-17 (×8): 25 mg via ORAL
  Filled 2023-03-10 (×8): qty 1

## 2023-03-10 MED ORDER — THROMBIN 20000 UNITS EX SOLR
CUTANEOUS | Status: DC | PRN
  Administered 2023-03-10: 20 mL via TOPICAL

## 2023-03-10 MED ORDER — HEMOSTATIC AGENTS (NO CHARGE) OPTIME
TOPICAL | Status: DC | PRN
  Administered 2023-03-10: 1 via TOPICAL

## 2023-03-10 MED ORDER — THROMBIN 5000 UNITS EX SOLR
OROMUCOSAL | Status: DC | PRN
  Administered 2023-03-10: 5 mL via TOPICAL

## 2023-03-10 MED ORDER — BACITRACIN ZINC 500 UNIT/GM EX OINT
TOPICAL_OINTMENT | CUTANEOUS | Status: DC | PRN
  Administered 2023-03-10: 1 via TOPICAL

## 2023-03-10 SURGICAL SUPPLY — 73 items
BAG COUNTER SPONGE SURGICOUNT (BAG) ×2 IMPLANT
BIT DRILL WIRE PASS 1.3MM (BIT) IMPLANT
BLADE CLIPPER SPEC (BLADE) IMPLANT
BLADE ULTRA TIP 2M (BLADE) IMPLANT
BUR PRECISION FLUTE 6.0 (BURR) ×2 IMPLANT
BUR SPIRAL ROUTER 2.3 (BUR) IMPLANT
CANISTER SUCT 3000ML PPV (MISCELLANEOUS) ×4 IMPLANT
CATH VENTRIC 35X38 W/TROCAR LG (CATHETERS) IMPLANT
CLIP TI MEDIUM 6 (CLIP) IMPLANT
CNTNR URN SCR LID CUP LEK RST (MISCELLANEOUS) ×2 IMPLANT
COVER BACK TABLE 60X90IN (DRAPES) IMPLANT
COVERAGE SUPPORT O-ARM STEALTH (MISCELLANEOUS) ×2
DRAPE MICROSCOPE SLANT 54X150 (MISCELLANEOUS) ×2 IMPLANT
DRAPE NEUROLOGICAL W/INCISE (DRAPES) ×2 IMPLANT
DRAPE SURG 17X23 STRL (DRAPES) IMPLANT
DRAPE WARM FLUID 44X44 (DRAPES) ×2 IMPLANT
DRILL WIRE PASS 1.3MM (BIT)
DRSG OPSITE POSTOP 4X6 (GAUZE/BANDAGES/DRESSINGS) IMPLANT
ELECT REM PT RETURN 9FT ADLT (ELECTROSURGICAL) ×2
ELECTRODE REM PT RTRN 9FT ADLT (ELECTROSURGICAL) ×2 IMPLANT
EVACUATOR 1/8 PVC DRAIN (DRAIN) IMPLANT
EVACUATOR SILICONE 100CC (DRAIN) IMPLANT
FEE COVERAGE SUPPORT O-ARM (MISCELLANEOUS) ×2 IMPLANT
GAUZE 4X4 16PLY ~~LOC~~+RFID DBL (SPONGE) IMPLANT
GAUZE SPONGE 4X4 12PLY STRL (GAUZE/BANDAGES/DRESSINGS) IMPLANT
GLOVE BIO SURGEON STRL SZ 6.5 (GLOVE) ×2 IMPLANT
GLOVE BIO SURGEON STRL SZ8 (GLOVE) ×4 IMPLANT
GLOVE BIO SURGEON STRL SZ8.5 (GLOVE) ×4 IMPLANT
GLOVE BIOGEL PI IND STRL 6.5 (GLOVE) ×2 IMPLANT
GLOVE EXAM NITRILE XL STR (GLOVE) IMPLANT
GOWN STRL REUS W/ TWL LRG LVL3 (GOWN DISPOSABLE) IMPLANT
GOWN STRL REUS W/ TWL XL LVL3 (GOWN DISPOSABLE) ×2 IMPLANT
HEMOSTAT POWDER KIT SURGIFOAM (HEMOSTASIS) ×2 IMPLANT
HEMOSTAT SURGICEL 2X14 (HEMOSTASIS) ×2 IMPLANT
KIT BASIN OR (CUSTOM PROCEDURE TRAY) ×2 IMPLANT
KIT DRAIN CSF ACCUDRAIN (MISCELLANEOUS) IMPLANT
KIT TURNOVER KIT B (KITS) ×2 IMPLANT
MARKER SKIN DUAL TIP RULER LAB (MISCELLANEOUS) IMPLANT
MARKER SPHERE PSV REFLC NDI (MISCELLANEOUS) IMPLANT
NDL HYPO 22X1.5 SAFETY MO (MISCELLANEOUS) ×2 IMPLANT
NEEDLE HYPO 22X1.5 SAFETY MO (MISCELLANEOUS) ×2
NS IRRIG 1000ML POUR BTL (IV SOLUTION) ×2 IMPLANT
PACK BATTERY CMF DISP FOR DVR (ORTHOPEDIC DISPOSABLE SUPPLIES) ×2 IMPLANT
PACK CRANIOTOMY CUSTOM (CUSTOM PROCEDURE TRAY) ×2 IMPLANT
PAD ARMBOARD 7.5X6 YLW CONV (MISCELLANEOUS) ×2 IMPLANT
PATTIES SURGICAL .25X.25 (GAUZE/BANDAGES/DRESSINGS) IMPLANT
PATTIES SURGICAL .5 X.5 (GAUZE/BANDAGES/DRESSINGS) IMPLANT
PATTIES SURGICAL .5 X3 (DISPOSABLE) IMPLANT
PATTIES SURGICAL 1X1 (DISPOSABLE) IMPLANT
PIN MAYFIELD SKULL DISP (PIN) ×2 IMPLANT
PLATE CRANIAL 12 2H RIGID UNI (Plate) IMPLANT
SCREW UNIII AXS SD 1.5X4 (Screw) IMPLANT
SPECIMEN JAR SMALL (MISCELLANEOUS) IMPLANT
SPONGE NEURO XRAY DETECT 1X3 (DISPOSABLE) IMPLANT
SPONGE SURGIFOAM ABS GEL 100 (HEMOSTASIS) ×2 IMPLANT
STAPLER SKIN PROX WIDE 3.9 (STAPLE) ×2 IMPLANT
STOCKINETTE 6 STRL (DRAPES) IMPLANT
SUT ETHILON 3 0 FSL (SUTURE) IMPLANT
SUT ETHILON 3 0 PS 1 (SUTURE) IMPLANT
SUT NURALON 4 0 TR CR/8 (SUTURE) ×4 IMPLANT
SUT PROLENE 6 0 BV (SUTURE) IMPLANT
SUT SILK 0 TIES 10X30 (SUTURE) IMPLANT
SUT VIC AB 2-0 CP2 18 (SUTURE) ×2 IMPLANT
SUT VIC AB 3-0 FS2 27 (SUTURE) IMPLANT
SUT VICRYL 4-0 PS2 18IN ABS (SUTURE) IMPLANT
TIP TISSUE SONOPET IQ STD 12 (TIP) IMPLANT
TOWEL GREEN STERILE (TOWEL DISPOSABLE) ×2 IMPLANT
TOWEL GREEN STERILE FF (TOWEL DISPOSABLE) ×2 IMPLANT
TRAY FOLEY MTR SLVR 16FR STAT (SET/KITS/TRAYS/PACK) ×2 IMPLANT
TUBE CONNECTING 12X1/4 (SUCTIONS) IMPLANT
TUBING FEATHERFLOW (TUBING) IMPLANT
UNDERPAD 30X36 HEAVY ABSORB (UNDERPADS AND DIAPERS) IMPLANT
WATER STERILE IRR 1000ML POUR (IV SOLUTION) ×2 IMPLANT

## 2023-03-10 NOTE — Anesthesia Procedure Notes (Signed)
 Arterial Line Insertion Start/End1/13/2025 1:00 PM, 03/10/2023 1:10 PM Performed by: Zelphia Norleen HERO, CRNA, CRNA  Patient location: Pre-op. Preanesthetic checklist: patient identified, IV checked, site marked, risks and benefits discussed, surgical consent, monitors and equipment checked, pre-op evaluation, timeout performed and anesthesia consent Lidocaine  1% used for infiltration Left, radial was placed Catheter size: 20 G Hand hygiene performed  and maximum sterile barriers used   Attempts: 2 Procedure performed using ultrasound guided technique. Following insertion, dressing applied and Biopatch. Post procedure assessment: normal and unchanged  Post procedure complications: unsuccessful attempts. Patient tolerated the procedure well with no immediate complications.

## 2023-03-10 NOTE — Progress Notes (Signed)
 Tyler [name]   DOB:Dec 18, 1950   FM#:[id number]      ASSESSMENT & PLAN:  1.  New brain mass, relapsed lymphoma? versus primary brain tumor -CT head suspicious for mets to posterior right parietal lobe -MRI head shows mass right parietal lobe with vasogenic edema. -Differential includes solitary intracranial mets versus primary CNS neoplasm. - CT scan abdomen pelvis done essentially stable exam. - Continue Decadron  as ordered, tolerating well -Neurosurgery consult appreciated-treatment options including doing nothing, radiation therapy, surgery discussed with patient. - Patient has decided he wants to proceed with brain surgery/right parietal craniotomy. -Continue follow-up with medical oncology/Dr. Onesimo   2.  Diffuse large B-cell lymphoma (DLBCL) -Initially diagnosed 07/25/2022 and was started on chemotherapy with R-CHOP q. 21 days June 2024 - Medical oncology/Dr. Onesimo following patient   3.  History of MDS - Patient with history of low-grade MDS - Will continue to monitor counts   4.  A-fib - Status post Eliquis .  If patient continues to have A-fib, would be reason to restart. - Follow closely with cardiology   5.  Prostate cancer - Diagnosed 2021   6.  Hep B antibody positive, chronic - On tenofovir  for hep B reactivation suppression in context of Rituxan  use.  Plan is to continue for 6 months after his last chemotherapy. - Follow ID closely  Code Status Full   Subjective:  Patient seen earlier this morning awake and alert laying in bed.  Patient is very pleasant, aware of brain surgery this afternoon.  States he has not eaten anything today because of his upcoming surgery.  Denies dizziness, nausea, vomiting, or other acute symptoms.  No acute events overnight.  No acute distress is noted.  Objective:  Vitals:   03/10/23 0450 03/10/23 0950  BP: (!) 140/78 (!) 180/102  Pulse: 62 (!) 51  Resp: 18   Temp: 98 F (36.7 C) 98 F (36.7 C)  SpO2: 98% 99%     Intake/Output  Summary (Last 24 hours) at 03/10/2023 1015 Last data filed at 03/10/2023 9051 Gross per 24 hour  Intake 360 ml  Output 250 ml  Net 110 ml     REVIEW OF SYSTEMS:   Constitutional: +Fatigue, denies fevers, chills or abnormal night sweats Eyes: Denies blurriness of vision, double vision or watery eyes Ears, nose, mouth, throat, and face: Denies mucositis or sore throat Respiratory: Denies cough, dyspnea or wheezes Cardiovascular: Denies palpitation, chest discomfort or lower extremity swelling Gastrointestinal:  Denies nausea, heartburn or change in bowel habits Skin: Denies abnormal skin rashes Lymphatics: Denies new lymphadenopathy or easy bruising Neurological: Denies numbness, tingling or new weaknesses Behavioral/Psych: Mood is stable, no new changes  All other systems were reviewed with the patient and are negative.  PHYSICAL EXAMINATION: ECOG PERFORMANCE STATUS: 3 - Symptomatic, >50% confined to bed  Vitals:   03/10/23 0450 03/10/23 0950  BP: (!) 140/78 (!) 180/102  Pulse: 62 (!) 51  Resp: 18   Temp: 98 F (36.7 C) 98 F (36.7 C)  SpO2: 98% 99%   There were no vitals filed for this visit.  GENERAL: alert, no distress and comfortable SKIN: +Pale skin color, texture, turgor are normal, no rashes or significant lesions EYES: normal, conjunctiva are pink and non-injected, sclera clear OROPHARYNX: no exudate, no erythema and lips, buccal mucosa, and tongue normal  NECK: supple, thyroid normal size, non-tender, without nodularity LYMPH: no palpable lymphadenopathy in the cervical, axillary or inguinal LUNGS: clear to auscultation and percussion with normal breathing effort HEART: regular rate &  rhythm and no murmurs and no lower extremity edema ABDOMEN: abdomen soft, non-tender and normal bowel sounds MUSCULOSKELETAL: no cyanosis of digits and no clubbing  PSYCH: alert & oriented x 3 with fluent speech NEURO: no focal motor/sensory deficits   All questions were  answered. The patient knows to call the clinic with any problems, questions or concerns.   The total time spent in the appointment was 40 minutes encounter with patient including review of chart and various tests results, discussions about plan of care and coordination of care plan  Olam JINNY Brunner, NP 03/10/2023 10:15 AM    Labs Reviewed:  Lab Results  Component Value Date   WBC 6.8 03/10/2023   HGB 15.1 03/10/2023   HCT 43.5 03/10/2023   MCV 88.1 03/10/2023   PLT 180 03/10/2023   Recent Labs    08/11/22 0316 08/11/22 1544 08/12/22 0515 08/12/22 1400 08/20/22 0302 08/21/22 0334 11/15/22 0830 12/05/22 0801 01/15/23 1300 01/28/23 0840 03/06/23 2020 03/10/23 0515  NA 134*   < > 137   < > 137   < > 141 141 141 145 140 138  K 3.7   < > 3.3*   < > 4.0   < > 3.4* 3.4* 3.6 3.6 3.6 3.9  CL 99   < > 101   < > 102   < > 107 106 107 107 105 103  CO2 25   < > 28   < > 24   < > 28 28 30 26 25 25   GLUCOSE 185*   < > 114*   < > 78   < > 106* 112* 118* 72 116* 124*  BUN 52*   < > 41*   < > 45*   < > 12 11 12 15 9 23   CREATININE 1.58*   < > 1.44*   < > 3.38*   < > 0.82 0.88 0.84 0.99 0.88 1.03  CALCIUM  7.4*   < > 7.5*   < > 7.2*   < > 9.0 9.3 9.1 9.0 9.1 8.8*  GFRNONAA 47*   < > 52*   < > 19*   < > >60 >60 >60  --  >60 >60  PROT 4.6*  --  4.2*  --  3.9*   < > 6.4* 6.6 6.6 6.4  --   --   ALBUMIN  1.8*  1.7*   < > 1.7*  1.6*   < > 1.7*   < > 4.0 4.1 4.2  --   --   --   AST 33  --  31  --  25   < > 26 29 38 33  --   --   ALT 31  --  31  --  18   < > 31 34 46* 40  --   --   ALKPHOS 84  --  79  --  111   < > 92 93 100  --   --   --   BILITOT 3.2*  --  2.5*  --  0.8   < > 0.3 0.3 0.5 0.6  --   --   BILIDIR 1.7*  --  1.3*  --  0.2  --   --   --   --   --   --   --   IBILI 1.5*  --  1.2*  --  0.6  --   --   --   --   --   --   --    < > =  values in this interval not displayed.    Studies Reviewed:  CT ABDOMEN PELVIS W CONTRAST Result Date: 03/07/2023 CLINICAL DATA:  Metastatic disease  evaluation. History of diffuse large B-cell lymphoma. * Tracking Code: BO * EXAM: CT ABDOMEN AND PELVIS WITH CONTRAST TECHNIQUE: Multidetector CT imaging of the abdomen and pelvis was performed using the standard protocol following bolus administration of intravenous contrast. RADIATION DOSE REDUCTION: This exam was performed according to the departmental dose-optimization program which includes automated exposure control, adjustment of the mA and/or kV according to patient size and/or use of iterative reconstruction technique. CONTRAST:  75mL OMNIPAQUE  IOHEXOL  350 MG/ML SOLN COMPARISON:  CT scan nuclear medicine PET-CT scan from 12/27/2022. FINDINGS: Lower chest: There are patchy atelectatic changes in the visualized lung bases. No overt consolidation. No pleural effusion. The heart is normal in size. No pericardial effusion. Hepatobiliary: The liver is normal in size. Non-cirrhotic configuration. No suspicious mass. There are at least 2, subcentimeter hypoattenuating foci in the liver, which are too small to adequately characterize. No intrahepatic or extrahepatic bile duct dilation. No calcified gallstones. Normal gallbladder wall thickness. No pericholecystic inflammatory changes. Pancreas: Small/atrophic pancreas. No peripancreatic fat stranding. No discrete mass seen. Main pancreatic duct is not dilated. Spleen: Within normal limits. No focal lesion. Adrenals/Urinary Tract: Adrenal glands are unremarkable. No suspicious renal mass. There are multiple bilateral simple renal cysts with largest arising from the right kidney lower pole, laterally measuring up to 2.8 x 3.3 cm. No nephroureterolithiasis or obstructive uropathy. Unremarkable urinary bladder. Stomach/Bowel: No disproportionate dilation of the small or large bowel loops. No evidence of abnormal bowel wall thickening or inflammatory changes. The appendix is unremarkable. There are scattered diverticula mainly in the sigmoid colon, without imaging signs  of diverticulitis. Vascular/Lymphatic: No ascites or pneumoperitoneum. No abdominal or pelvic lymphadenopathy, by size criteria. No aneurysmal dilation of the major abdominal arteries. There are mild peripheral atherosclerotic vascular calcifications of the aorta and its major branches. Reproductive: Patient is status post surgical removal of prostate and bilateral seminal vesicles. Other: There are postsurgical changes along the midline inferior anterior abdominal wall. There are bilateral small fat containing inguinal hernias. Musculoskeletal: No suspicious osseous lesions. There are mild multilevel degenerative changes in the visualized spine. Redemonstration of mild loss of height of L4 vertebral body. IMPRESSION: 1. Essentially stable exam when compared to recent PET-CT scan from 12/27/2022. No new lymphadenopathy by size criteria. 2. Multiple other nonacute observations, as described above. Electronically Signed   By: Ree Molt M.D.   On: 03/07/2023 16:36   MR Brain W and Wo Contrast Result Date: 03/07/2023 CLINICAL DATA:  Initial evaluation for brain/CNS neoplasm. EXAM: MRI HEAD WITHOUT AND WITH CONTRAST TECHNIQUE: Multiplanar, multiecho pulse sequences of the brain and surrounding structures were obtained without and with intravenous contrast. CONTRAST:  8mL GADAVIST  GADOBUTROL  1 MMOL/ML IV SOLN COMPARISON:  CT from earlier the same day as well as previous MRI from 07/30/2022. FINDINGS: Brain: Age-related cerebral atrophy. Patchy T2/FLAIR hyperintensity involving the periventricular and deep white matter both cerebral hemispheres, most likely related to chronic microvascular ischemic disease. No acute or subacute infarct. Heterogeneous enhancing mass positioned at the right parietal lobe measures 4.1 x 3.1 x 3.1 cm (series 17, image 45). Associated susceptibility artifact consistent with necrosis and/or blood products. Surrounding T2/FLAIR signal intensity throughout the adjacent right cerebral  hemisphere, consistent with vasogenic edema. Effacement of the posterior right lateral ventricle. Asymmetric dilatation of the right temporal horn consistent with a degree of ventricular trapping. No visible  transependymal flow of CSF. Associated 4 mm of right-to-left shift. No other mass lesion or abnormal enhancement. No other acute or chronic intracranial blood products. Pituitary gland and suprasellar region within normal limits. Vascular: Major intracranial vascular flow voids are maintained. Skull and upper cervical spine: Craniocervical junction within normal limits. Bone marrow signal intensity normal. No scalp soft tissue abnormality. Sinuses/Orbits: Globes and orbital soft tissues within normal limits. Mild mucosal thickening present about the ethmoidal air cells and maxillary sinuses. No significant mastoid effusion. Other: None. IMPRESSION: 1. 4.1 x 3.1 x 3.1 cm heterogeneous enhancing mass positioned at the right parietal lobe with surrounding vasogenic edema and 4 mm of right-to-left shift. Differential considerations include a solitary intracranial metastasis versus primary CNS neoplasm. 2. Underlying age-related cerebral atrophy with chronic small vessel ischemic disease. Electronically Signed   By: Morene Hoard M.D.   On: 03/07/2023 04:19   CT Head Wo Contrast Result Date: 03/06/2023 CLINICAL DATA:  Blunt polytrauma, weakness EXAM: CT HEAD WITHOUT CONTRAST CT CERVICAL SPINE WITHOUT CONTRAST TECHNIQUE: Multidetector CT imaging of the head and cervical spine was performed following the standard protocol without intravenous contrast. Multiplanar CT image reconstructions of the cervical spine were also generated. RADIATION DOSE REDUCTION: This exam was performed according to the departmental dose-optimization program which includes automated exposure control, adjustment of the mA and/or kV according to patient size and/or use of iterative reconstruction technique. COMPARISON:  MRI head  07/30/2022 and CT head 07/29/2022 FINDINGS: CT HEAD FINDINGS Brain: Hypoattenuation within the periventricular white matter of the right parietal and occipital lobes is favored due to edema. This appears new since PET/CT 12/27/2022. Possible 2.9 x 3.1 cm circumscribed mass in the posterior right parietal lobe (series 2/image 18). This may be the cause of the edema. MRI is recommended for further evaluation. There is mass effect on the atrium and occipital horn of the right lateral ventricle. 4 mm of leftward midline shift at the septum pellucidum. The basal cisterns are patent. No hydrocephalus. No extra-axial fluid collection. Vascular: No hyperdense vessel or unexpected calcification. Skull: No acute fracture or destructive osseous lesion. Mucosal thickening in the maxillary sinuses. Sinuses/Orbits: No acute finding. Other: None. CT CERVICAL SPINE FINDINGS Alignment: No evidence of traumatic listhesis. Skull base and vertebrae: No acute fracture. No primary bone lesion or focal pathologic process. Soft tissues and spinal canal: No prevertebral fluid or swelling. No visible canal hematoma. Disc levels: Multilevel spondylosis, disc space height loss, and degenerative endplate changes greatest at C5-C6 where it is advanced. No severe spinal canal narrowing. Upper chest: No acute abnormality. Other: None. IMPRESSION: 1. Findings suspicious for metastasis in the posterior right parietal lobe with surrounding vasogenic edema. 4 mm of leftward midline shift. MRI with and without IV contrast is recommended for further evaluation. 2. No acute fracture in the cervical spine. Electronically Signed   By: Norman Gatlin M.D.   On: 03/06/2023 20:44   CT Cervical Spine Wo Contrast Result Date: 03/06/2023 CLINICAL DATA:  Blunt polytrauma, weakness EXAM: CT HEAD WITHOUT CONTRAST CT CERVICAL SPINE WITHOUT CONTRAST TECHNIQUE: Multidetector CT imaging of the head and cervical spine was performed following the standard protocol  without intravenous contrast. Multiplanar CT image reconstructions of the cervical spine were also generated. RADIATION DOSE REDUCTION: This exam was performed according to the departmental dose-optimization program which includes automated exposure control, adjustment of the mA and/or kV according to patient size and/or use of iterative reconstruction technique. COMPARISON:  MRI head 07/30/2022 and CT head 07/29/2022 FINDINGS: CT HEAD  FINDINGS Brain: Hypoattenuation within the periventricular white matter of the right parietal and occipital lobes is favored due to edema. This appears new since PET/CT 12/27/2022. Possible 2.9 x 3.1 cm circumscribed mass in the posterior right parietal lobe (series 2/image 18). This may be the cause of the edema. MRI is recommended for further evaluation. There is mass effect on the atrium and occipital horn of the right lateral ventricle. 4 mm of leftward midline shift at the septum pellucidum. The basal cisterns are patent. No hydrocephalus. No extra-axial fluid collection. Vascular: No hyperdense vessel or unexpected calcification. Skull: No acute fracture or destructive osseous lesion. Mucosal thickening in the maxillary sinuses. Sinuses/Orbits: No acute finding. Other: None. CT CERVICAL SPINE FINDINGS Alignment: No evidence of traumatic listhesis. Skull base and vertebrae: No acute fracture. No primary bone lesion or focal pathologic process. Soft tissues and spinal canal: No prevertebral fluid or swelling. No visible canal hematoma. Disc levels: Multilevel spondylosis, disc space height loss, and degenerative endplate changes greatest at C5-C6 where it is advanced. No severe spinal canal narrowing. Upper chest: No acute abnormality. Other: None. IMPRESSION: 1. Findings suspicious for metastasis in the posterior right parietal lobe with surrounding vasogenic edema. 4 mm of leftward midline shift. MRI with and without IV contrast is recommended for further evaluation. 2. No acute  fracture in the cervical spine. Electronically Signed   By: Norman Gatlin M.D.   On: 03/06/2023 20:44

## 2023-03-10 NOTE — Progress Notes (Signed)
 As requested I spoke with the patient's daughter, Karena Addison, and answered all her questions.

## 2023-03-10 NOTE — Progress Notes (Signed)
 PT Cancellation Note  Patient Details Name: Cairo Agostinelli MRN: [id number] DOB: [birth year]   Cancelled Treatment:    Reason Eval/Treat Not Completed: (P) Patient at procedure or test/unavailable (pt at OR for R Parietal Craniotomy.) Will continue efforts next date per PT plan of care as schedule permits.   Connell HERO Yvette Roark 03/10/2023, 12:26 PM

## 2023-03-10 NOTE — Transfer of Care (Signed)
 Immediate Anesthesia Transfer of Care Note  Patient: Tyler [name]  Procedure(s) Performed: PARIETAL CRANIOTOMY (Right: Head) APPLICATION OF CRANIAL NAVIGATION (Right)  Patient Location: PACU  Anesthesia Type:General  Level of Consciousness: awake and sedated  Airway & Oxygen Therapy: Patient Spontanous Breathing and Patient connected to face mask oxygen  Post-op Assessment: Report given to RN and Post -op Vital signs reviewed and stable  Post vital signs: Reviewed and stable  Last Vitals:  Vitals Value Taken Time  BP 142/92 03/10/23 1704  Temp    Pulse 89 03/10/23 1708  Resp 9 03/10/23 1708  SpO2 93 % 03/10/23 1708  Vitals shown include unfiled device data.  Last Pain:  Vitals:   03/10/23 1322  TempSrc: Oral  PainSc: 0-No pain      Patients Stated Pain Goal: 0 (03/09/23 2048)  Complications: No notable events documented.

## 2023-03-10 NOTE — Op Note (Signed)
 Brief history: The patient is a 73 year old black male with a history of lymphoma who presented with left-sided weakness.  He was worked up with a head CT and brain MRI which demonstrated a right parietal brain tumor.  He had a CT of the abdomen pelvis which demonstrated stable systemic disease.  I discussed the various treatment options.  He has decided proceed with a craniotomy for debulking/resection of tumor.  Preoperative gnosis: Right parietal brain tumor  Postop diagnosis: The same  Procedure: Right parietal craniotomy with cranial navigation using microdissection  Surgeon: Dr. Chyrl Budge  Assistant: Gerard Beck  Anesthesia: General Tracheal  Estimated blood loss: 100 cc  Specimens: Tumor  Complications: None  Drains: None  Description of procedure: The patient was brought to the operating room by the anesthesia team.  General endotracheal anesthesia was induced.  The patient remained in the supine position.  I applied the Mayfield 3 point headrest to his calvarium.  I entered the surface coordinates to the Stealth neuronavigation computer.  We then used a clippers to shave the patient's scalp and prepared with Betadine scrub and Betadine solution.  Sterile drapes were applied.  I used the Stealth neuronavigational system to confirm the location of the tumor.  I then injected the area to be incised with Marcaine  with epinephrine  solution.  I then used a scalpel to make a linear incision over the patient's tumor.  We used Raney clips for wound edge hemostasis and the Applebaum retractor for exposure.  I then used a high-speed drill to create a right parietal burr hole.  I then used the footplate device to create a right parietal craniotomy.  I elevated the craniotomy flap with the Penfield #1 exposing underlying dura.  I then incised the dura in a cruciate fashion with the 15 blade scalpel and a Public house manager.  We tacked up the dural edges.  We then brought the operative  microscope into the field.  I used the 15 blade scalpel, bipolar cautery and suction to create a small corticotomy.  We immediately countered the tumor.  We sent some specimens to the pathologist.  While we are waiting for the pathology to come back I used the sono PET to internally debulk the tumor.  We obtain more specimens for permanent sections.  I was able to achieve a gross total resection of tumor.  At this time the pathologist called and said he felt the tumor was consistent with a lymphoproliferative disorder/his known lymphoma.  We obtained hemostasis with bipolar cautery, Floseal and Surgicel.  We then reapproximated the patient's dura with 4-0 Nurolon suture.  We then reapproximated patient's craniotomy flap with titanium mini plates and screws.  We then removed the retractor and reapproximated the patient's galea with interrupted 2-0 Vicryl suture.  We were approximate the skin with stainless steel staples.  The wound was then coated with bacitracin  ointment.  A sterile dressing was applied.  The drapes were removed.  I then removed the Mayfield 3 point headrest from the patient's calvarium.  By report all sponge, instrument, and needle counts were correct at the end of this case.

## 2023-03-10 NOTE — Progress Notes (Signed)
 Subjective: The patient is alert and pleasant.  He is in no apparent distress.  Objective: Vital signs in last 24 hours: Temp:  [97.9 F (36.6 C)-98.4 F (36.9 C)] 98 F (36.7 C) (01/13 0450) Pulse Rate:  [53-75] 62 (01/13 0450) Resp:  [17-18] 18 (01/13 0450) BP: (140-175)/(78-105) 140/78 (01/13 0450) SpO2:  [98 %-100 %] 98 % (01/13 0450) Estimated body mass index is 28.76 kg/m as calculated from the following:   Height as of 12/30/22: 5' 7 (1.702 m).   Weight as of 01/28/23: 83.3 kg.   Intake/Output from previous day: 01/12 0701 - 01/13 0700 In: 720 [P.O.:720] Out: 250 [Urine:250] Intake/Output this shift: No intake/output data recorded.  Physical exam the patient is alert and oriented.  His left hemiparesis has improved since starting the Decadron  but he still has some slight weakness.  Lab Results: Recent Labs    03/10/23 0515  WBC 6.8  HGB 15.1  HCT 43.5  PLT 180   BMET Recent Labs    03/10/23 0515  NA 138  K 3.9  CL 103  CO2 25  GLUCOSE 124*  BUN 23  CREATININE 1.03  CALCIUM  8.8*    Studies/Results: No results found.  Assessment/Plan: Right parietal brain tumor: I have again discussed the situation with the patient.  We discussed the various options including doing nothing, empiric radiation, biopsy, and craniotomy for resection/debulking of the tumor.  We again discussed the craniotomy.  We discussed the risk, benefits, alternatives, expected postoperative course, and likelihood of achieving our goals with surgery.  I have answered all his questions.  He wants to proceed with surgery.  LOS: 3 days     Reyes JONETTA Budge 03/10/2023, 7:57 AM     Patient ID: Tyler [name], male   DOB: 11/01/1950, 73 y.o.   MRN: [id number]

## 2023-03-10 NOTE — Anesthesia Preprocedure Evaluation (Addendum)
 Anesthesia Evaluation  Patient identified by MRN, date of birth, ID band Patient awake and Patient confused    Reviewed: Allergy & Precautions, NPO status , Patient's Chart, lab work & pertinent test results  Airway Mallampati: II  TM Distance: >3 FB Neck ROM: Full    Dental  (+) Dental Advisory Given, Teeth Intact   Pulmonary neg pulmonary ROS   Pulmonary exam normal breath sounds clear to auscultation       Cardiovascular hypertension, Pt. on medications and Pt. on home beta blockers Normal cardiovascular exam+ Valvular Problems/Murmurs MR  Rhythm:Regular Rate:Normal  Echo 09/2022  1. Left ventricular ejection fraction, by estimation, is 60 to 65%. The left ventricle has normal function. The left ventricle has no regional wall motion abnormalities. There is mild concentric left ventricular hypertrophy. Left ventricular diastolic parameters are consistent with Grade I diastolic dysfunction (impaired relaxation). The average left ventricular global longitudinal strain is -21.9 %. The global longitudinal strain is normal.   2. Right ventricular systolic function is normal. The right ventricular size is normal. Tricuspid regurgitation signal is inadequate for assessing PA pressure.   3. The mitral valve is normal in structure. Mild mitral valve regurgitation. No evidence of mitral stenosis.   4. The aortic valve is tricuspid. Aortic valve regurgitation is not visualized. No aortic stenosis is present.   5. The inferior vena cava is normal in size with greater than 50% respiratory variability, suggesting right atrial pressure of 3 mmHg.     Neuro/Psych negative neurological ROS     GI/Hepatic PUD,,,  Endo/Other  negative endocrine ROS    Renal/GU Renal disease     Musculoskeletal negative musculoskeletal ROS (+)    Abdominal   Peds  Hematology  (+) Blood dyscrasia, anemia   Anesthesia Other Findings    Reproductive/Obstetrics                             Anesthesia Physical Anesthesia Plan  ASA: 3  Anesthesia Plan: General   Post-op Pain Management: Ofirmev  IV (intra-op)*   Induction: Intravenous  PONV Risk Score and Plan: 3 and Ondansetron , Dexamethasone  and Treatment may vary due to age or medical condition  Airway Management Planned: Oral ETT  Additional Equipment: Arterial line  Intra-op Plan:   Post-operative Plan: Possible Post-op intubation/ventilation  Informed Consent: I have reviewed the patients History and Physical, chart, labs and discussed the procedure including the risks, benefits and alternatives for the proposed anesthesia with the patient or authorized representative who has indicated his/her understanding and acceptance.     Dental advisory given and Consent reviewed with POA  Plan Discussed with: CRNA  Anesthesia Plan Comments: (2 x PIV)        Anesthesia Quick Evaluation

## 2023-03-10 NOTE — Progress Notes (Signed)
 Subjective: The patient is somnolent but arousable.  He is in no apparent distress.  Objective: Vital signs in last 24 hours: Temp:  [97.9 F (36.6 C)-98 F (36.7 C)] 98 F (36.7 C) (01/13 1322) Pulse Rate:  [51-62] 51 (01/13 0950) Resp:  [17-22] 22 (01/13 1322) BP: (140-183)/(78-105) 183/98 (01/13 1322) SpO2:  [97 %-99 %] 97 % (01/13 1322) Weight:  [87.5 kg] 87.5 kg (01/13 1322) Estimated body mass index is 30.23 kg/m as calculated from the following:   Height as of this encounter: 5' 7 (1.702 m).   Weight as of this encounter: 87.5 kg.   Intake/Output from previous day: 01/12 0701 - 01/13 0700 In: 720 [P.O.:720] Out: 250 [Urine:250] Intake/Output this shift: Total I/O In: 1200 [I.V.:1200] Out: 625 [Urine:425; Blood:200]  Physical exam the patient is somnolent but arousable.  He follows commands bilaterally.  Lab Results: Recent Labs    03/10/23 0515  WBC 6.8  HGB 15.1  HCT 43.5  PLT 180   BMET Recent Labs    03/10/23 0515  NA 138  K 3.9  CL 103  CO2 25  GLUCOSE 124*  BUN 23  CREATININE 1.03  CALCIUM  8.8*    Studies/Results: No results found.  Assessment/Plan: The patient is doing well.  I spoke with his daughter.  LOS: 3 days     Tyler [name] 03/10/2023, 5:08 PM

## 2023-03-10 NOTE — Progress Notes (Addendum)
 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 Patient tells me that it is on and off weakness and he had fallen many times. At the emergency room he had no focal neurological deficit, however he was found to have right parietal tumor with vasogenic edema and 4 mm right-to-left shift. Seen by neurosurgery and oncology with tentative plan for surgical resection. Currently remains on high-dose dexamethasone .    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  On high-dose dexamethasone  and we are continuing this.  -Seen by neurosurgery this morning and patient has decided upon surgery. -He will be going back this afternoon.  Sinus bradycardia:  - Currently stable.  Metoprolol  discontinued.  Blood pressure a little higher today.  No anginal symptoms after beta-blocker stopped yesterday.   Chronic hep B:  On tenofovir .   Paroxysmal A-fib:  Has remained in sinus rhythm.  Not anticoagulated due to high risk of bleeding.   Has been on Eliquis  previously.   Agree could likely restart if continues to have A-fib. -Follow-up with cardiology.  DVT prophylaxis: SCDs   Code Status:   Code Status: Full Code Level of care: ICU Status is: Inpatient Remains inpatient appropriate because: Inpatient procedures planned -surgery today   Consults called: Neurosurgery/oncology  Subjective: Patient seen and examined and prior to surgery today.  He was brushing his teeth.  No complaints from yesterday.  He is very interested in  moving forward with the surgery which is now planned for today.  Objective: Vitals:   03/09/23 2048 03/10/23 0450 03/10/23 0950 03/10/23 1322  BP: (!) 175/105 (!) 140/78 (!) 180/102 (!) 183/98  Pulse: (!) 53 62 (!) 51   Resp: 17 18  (!) 22  Temp: 97.9 F (36.6 C) 98 F (36.7 C) 98 F (36.7 C) 98 F (36.7 C)  TempSrc: Oral  Oral Oral  SpO2: 98% 98% 99% 97%  Weight:    87.5 kg  Height:    5' 7 (1.702 m)    Intake/Output Summary (Last 24 hours) at 03/10/2023 1452 Last data filed at 03/10/2023 0948 Gross per 24 hour  Intake 0 ml  Output --  Net 0 ml   Filed Weights   03/10/23 1322  Weight: 87.5 kg   Body mass index is 30.23 kg/m.  Gen: 73 y.o. male in no apparent distress.  Nontoxic Pulm: Non-labored breathing.  Clear to auscultation bilaterally.  CV: Regular rate and rhythm. No murmur, rub, or gallop. No JVD GI: Abdomen soft, non-tender, non-distended, with normoactive bowel sounds. No organomegaly or masses felt. Ext: Warm, no deformities, no pedal edema Skin: No rashes, lesions no ulcers Neuro: Alert and oriented.  With strength 4 out of 5 handgrip on the right.  2 out of 5 in grip on the left.  Strength is 5 out of 5 in lower extremities on the right.  It is 2 out of 5 lower extremity on the left.  Confluent.  No cranial nerve deficits noted on exam today. Psych: Calm  Judgement and insight appear normal. Mood & affect appropriate.     I have personally  reviewed the following labs and images: CBC: Recent Labs  Lab 03/06/23 2020 03/10/23 0515  WBC 4.6 6.8  HGB 14.6 15.1  HCT 42.7 43.5  MCV 90.5 88.1  PLT 160 180   BMP &GFR Recent Labs  Lab 03/06/23 2020 03/10/23 0515  NA 140 138  K 3.6 3.9  CL 105 103  CO2 25 25  GLUCOSE 116* 124*  BUN 9 23  CREATININE 0.88 1.03  CALCIUM  9.1 8.8*   Estimated Creatinine Clearance: 70.5 mL/min (by C-G formula based on SCr of 1.03 mg/dL). Liver & Pancreas: No results for input(s): AST, ALT, ALKPHOS, BILITOT,  PROT, ALBUMIN  in the last 168 hours. No results for input(s): LIPASE, AMYLASE in the last 168 hours. No results for input(s): AMMONIA in the last 168 hours. Diabetic: No results for input(s): HGBA1C in the last 72 hours. No results for input(s): GLUCAP in the last 168 hours. Cardiac Enzymes: No results for input(s): CKTOTAL, CKMB, CKMBINDEX, TROPONINI in the last 168 hours. No results for input(s): PROBNP in the last 8760 hours. Coagulation Profile: No results for input(s): INR, PROTIME in the last 168 hours. Thyroid Function Tests: No results for input(s): TSH, T4TOTAL, FREET4, T3FREE, THYROIDAB in the last 72 hours. Lipid Profile: No results for input(s): CHOL, HDL, LDLCALC, TRIG, CHOLHDL, LDLDIRECT in the last 72 hours. Anemia Panel: No results for input(s): VITAMINB12, FOLATE, FERRITIN, TIBC, IRON, RETICCTPCT in the last 72 hours. Urine analysis:    Component Value Date/Time   COLORURINE YELLOW 03/06/2023 2024   APPEARANCEUR HAZY (A) 03/06/2023 2024   LABSPEC 1.009 03/06/2023 2024   PHURINE 5.0 03/06/2023 2024   GLUCOSEU NEGATIVE 03/06/2023 2024   HGBUR NEGATIVE 03/06/2023 2024   BILIRUBINUR NEGATIVE 03/06/2023 2024   KETONESUR NEGATIVE 03/06/2023 2024   PROTEINUR NEGATIVE 03/06/2023 2024   NITRITE NEGATIVE 03/06/2023 2024   LEUKOCYTESUR NEGATIVE 03/06/2023 2024   Sepsis Labs: Invalid input(s): PROCALCITONIN, LACTICIDVEN  Microbiology: No results found for this or any previous visit (from the past 240 hours).  Radiology Studies: No results found.  Scheduled Meds:  sodium chloride    Intravenous Once   chlorhexidine        [MAR Hold] dexamethasone  (DECADRON ) injection  8 mg Intravenous Q12H   [MAR Hold] pantoprazole   40 mg Oral BID   [MAR Hold] tenofovir  alafenamide  25 mg Oral Daily   Continuous Infusions:  acetaminophen      ceFAZolin      remifentanil  (ULTIVA ) 2 mg in 100 mL normal saline (20  mcg/mL) Optime 0.1 mcg/kg/min (03/10/23 1445)     LOS: 3 days   25 minutes with more than 50% spent in reviewing records, counseling patient/family and coordinating care.  Reyes VEAR Gaw, MD [facility] [email or web address] 03/10/2023, 2:52 PM

## 2023-03-10 NOTE — Anesthesia Procedure Notes (Signed)
 Procedure Name: Intubation Date/Time: 03/10/2023 2:08 PM  Performed by: Cindie Ronal POUR, CRNAPre-anesthesia Checklist: Patient identified, Emergency Drugs available, Suction available and Patient being monitored Patient Re-evaluated:Patient Re-evaluated prior to induction Oxygen Delivery Method: Circle System Utilized Preoxygenation: Pre-oxygenation with 100% oxygen Induction Type: IV induction Ventilation: Mask ventilation without difficulty Laryngoscope Size: Miller and 2 Grade View: Grade II Tube type: Oral Number of attempts: 1 Airway Equipment and Method: Stylet Placement Confirmation: ETT inserted through vocal cords under direct vision, positive ETCO2 and breath sounds checked- equal and bilateral Secured at: 24 cm Tube secured with: Tape Dental Injury: Teeth and Oropharynx as per pre-operative assessment

## 2023-03-10 NOTE — Consult Note (Signed)
 NAME:  Euclid Cassetta, MRN:  [id number], DOB:  Feb 28, 1950, LOS: 3 ADMISSION DATE:  03/06/2023, CONSULTATION DATE:  03/10/2023 REFERRING MD:  Dr. 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 Head CT on admission concerning for metastatic disease, MRI brain confirmed right parietal lobe mass with surrounding vasogenic edema and 4 mm right-to-left shift.  Neurosurgery consulted and Decadron  initiated.   Neurosurgery discussed clinical options with patient and decision made to proceed with elective craniotomy with tumor debulking, procedure completed 1/13 with Dr. Mavis.   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 Head CT on admission concerning for metastatic disease, MRI brain confirmed right parietal lobe mass with surrounding vasogenic edema and 4 mm right-to-left shift. 1/13 elective craniotomy for tumor debulking   Interim History / Subjective:  Seen lying in bed post op in NAD  Objective   Blood pressure (!) 183/98, pulse (!) 51, temperature 98 F (36.7 C), temperature source Oral, resp. rate (!) 22, height 5' 7 (1.702 m), weight 87.5 kg, SpO2  97%.        Intake/Output Summary (Last 24 hours) at 03/10/2023 1442 Last data filed at 03/10/2023 9051 Gross per 24 hour  Intake 0 ml  Output --  Net 0 ml   Filed Weights   03/10/23 1322  Weight: 87.5 kg    Examination: General: Well appearing middle aged male lying in bed post op in NAD  HEENT: ETT, MM pink/moist, PERRL,  Neuro: Alert and oriented x3, non-focal  CV: s1s2 regular rate and rhythm, no murmur, rubs, or gallops,  PULM:  Clear to auscultation, no increased work of breathing, no added breath sounds   GI: soft, bowel sounds active in all 4 quadrants, non-tender, non-distended, tolerating oral diet Extremities: warm/dry, no edema  Skin: no rashes or lesions   Resolved Hospital Problem list     Assessment & Plan:  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 Mavis P: Primary management per neurosurgery Continue high-dose dexamethasone  Neuroprotective measures Frequent neurochecks Additional imaging per neurosurgery  Diffuse large B-cell lymphoma -Initially diagnosed Jul 25, 2022 started on chemo History of myelodysplastic syndrome History of prostate cancer P; Primary management per oncology Supportive care  Paroxysmal atrial fibrillation not anticoagulated at baseline due to high risk bleeding Sinus bradycardia Essential hypertension -Home medications include Toprol -XL P: Continuous telemetry Optimize electrolytes Hold home beta blocker   Chronic hepatitis B P: Continue home Tenofovir    Best Practice (right click and Reselect all SmartList Selections daily)   Diet/type: Regular consistency (see orders) DVT prophylaxis SCD Pressure ulcer(s): N/A GI prophylaxis: PPI Lines: Arterial Line Foley:  Yes, and it is still needed Code Status:  full code Last date  of multidisciplinary goals of  care discussion: Per primary   Labs   CBC: Recent Labs  Lab 03/06/23 2020 03/10/23 0515  WBC 4.6 6.8  HGB 14.6 15.1  HCT 42.7 43.5  MCV 90.5 88.1  PLT 160 180    Basic Metabolic Panel: Recent Labs  Lab 03/06/23 2020 03/10/23 0515  NA 140 138  K 3.6 3.9  CL 105 103  CO2 25 25  GLUCOSE 116* 124*  BUN 9 23  CREATININE 0.88 1.03  CALCIUM  9.1 8.8*   GFR: Estimated Creatinine Clearance: 70.5 mL/min (by C-G formula based on SCr of 1.03 mg/dL). Recent Labs  Lab 03/06/23 2020 03/10/23 0515  WBC 4.6 6.8    Liver Function Tests: No results for input(s): AST, ALT, ALKPHOS, BILITOT, PROT, ALBUMIN  in the last 168 hours. No results for input(s): LIPASE, AMYLASE in the last 168 hours. No results for input(s): AMMONIA in the last 168 hours.  ABG    Component Value Date/Time   PHART 7.34 (L) 08/08/2022 1507   PCO2ART 42 08/08/2022 1507   PO2ART 240 (H) 08/08/2022 1507   HCO3 22.7 08/08/2022 1507   ACIDBASEDEF 2.9 (H) 08/08/2022 1507   O2SAT 100 08/08/2022 1507     Coagulation Profile: No results for input(s): INR, PROTIME in the last 168 hours.  Cardiac Enzymes: No results for input(s): CKTOTAL, CKMB, CKMBINDEX, TROPONINI in the last 168 hours.  HbA1C: Hgb A1c MFr Bld  Date/Time Value Ref Range Status  08/09/2022 10:12 PM 4.8 4.8 - 5.6 % Final    Comment:    (NOTE) Pre diabetes:          5.7%-6.4%  Diabetes:              >6.4%  Glycemic control for   <7.0% adults with diabetes     CBG: No results for input(s): GLUCAP in the last 168 hours.  Review of Systems:   Please see the history of present illness. All other systems reviewed and are negative    Past Medical History:  He,  has a past medical history of Cancer (HCC), Follicular lymphoma (HCC) (2024), History of pulmonary embolism, Hypertension, Myelodysplastic syndrome (HCC), PAF (paroxysmal atrial fibrillation) (HCC), and Recurrent UTI.   Surgical History:   Past  Surgical History:  Procedure Laterality Date   BIOPSY  08/21/2022   Procedure: BIOPSY;  Surgeon: Albertus Gordy HERO, MD;  Location[facility] ENDOSCOPY;  Service: Gastroenterology;;   ESOPHAGOGASTRODUODENOSCOPY (EGD) WITH PROPOFOL  N/A 08/21/2022   Procedure: ESOPHAGOGASTRODUODENOSCOPY (EGD) WITH PROPOFOL ;  Surgeon: Albertus Gordy HERO, MD;  Location: [facility] ENDOSCOPY;  Service: Gastroenterology;  Laterality: N/A;   IR IMAGING GUIDED PORT INSERTION  08/26/2022   LYMPHADENECTOMY Bilateral 09/01/2019   Procedure: LYMPHADENECTOMY;  Surgeon: Alvaro Hummer, MD;  Location: [facility] ORS;  Service: Urology;  Laterality: Bilateral;   ROBOT ASSISTED LAPAROSCOPIC RADICAL PROSTATECTOMY N/A 09/01/2019   Procedure: XI ROBOTIC ASSISTED LAPAROSCOPIC RADICAL PROSTATECTOMY;  Surgeon: Alvaro Hummer, MD;  Location: [facility] ORS;  Service: Urology;  Laterality: N/A;  3 HRS     Social History:   reports that he has never smoked. He has never used smokeless tobacco. He reports current alcohol  use. He reports that he does not use drugs.   Family History:  His family history includes Stroke in his father.   Allergies Allergies  Allergen Reactions   Amitriptyline  Other (See Comments)    Nightmares, Night sweats, Migraines    Amlodipine Swelling    BILATERAL FEET TO ANKLES   Neurontin  [Gabapentin ] Other (See  Comments)    Nightmares; Night sweats; Headache     Home Medications  Prior to Admission medications   Medication Sig Start Date End Date Taking? Authorizing Provider  B Complex Vitamins (VITAMIN B COMPLEX ) TABS Take 1 tablet by mouth daily. Patient taking differently: Take 2 tablets by mouth daily. 12/21/21  Yes Amin, Ankit C, MD  cholecalciferol  (VITAMIN D3) 25 MCG (1000 UNIT) tablet Take 2 tablets (2,000 Units total) by mouth daily. Patient taking differently: Take 1,000 Units by mouth daily. 01/21/22  Yes Onesimo Emaline Brink, MD  clotrimazole -betamethasone  (LOTRISONE ) cream APPLY TO AFFECTED AREA TWICE A DAY Patient taking  differently: Apply 1 Application topically as needed (itching). 12/18/22  Yes Onesimo Emaline Brink, MD  diclofenac  Sodium (VOLTAREN ) 1 % GEL APPLY 4 G TOPICALLY 4 (FOUR) TIMES DAILY. APPLY TO RIGHT LATERAL HIP AND THIGH DOWN TO KNEE Patient taking differently: Apply 1 Application topically daily as needed (For pain). 06/12/22  Yes Rosalynn Camie CROME, MD  folic acid  (FOLVITE ) 1 MG tablet Take 1 mg by mouth daily. 03/27/22  Yes [provider]  furosemide  (LASIX ) 20 MG tablet Take 1 tablet (20 mg total) by mouth daily. Patient taking differently: Take 20 mg by mouth daily as needed for fluid or edema. 12/30/22  Yes O'Neal, Darryle Ned, MD  KLOR-CON  M20 20 MEQ tablet TAKE 1 TABLET BY MOUTH TWICE A DAY Patient taking differently: Take 20 mEq by mouth daily as needed (when taking the lasix ). 09/12/22  Yes Onesimo Emaline Brink, MD  lidocaine -prilocaine  (EMLA ) cream Apply to affected area once Patient taking differently: Apply 1 Application topically as needed (port). 09/18/22  Yes Thayil, Irene T, PA-C  Menthol-Methyl Salicylate (MUSCLE RUB) 10-15 % CREA Apply 1 Application topically daily as needed for muscle pain.   Yes [provider]  metoCLOPramide  (REGLAN ) 10 MG tablet Take 10 mg by mouth daily as needed for vomiting or nausea. 09/13/22  Yes [provider]  metoprolol  succinate (TOPROL  XL) 25 MG 24 hr tablet Take 1 tablet (25 mg total) by mouth daily. 12/30/22  Yes O'Neal, Darryle Ned, MD  Multiple Vitamin (MULTIVITAMIN WITH MINERALS) TABS tablet Take 1 tablet by mouth daily. 12/21/21  Yes Amin, Ankit C, MD  ondansetron  (ZOFRAN ) 8 MG tablet Take 1 tablet (8 mg total) by mouth every 8 (eight) hours as needed for nausea or vomiting. Start on the third day after cyclophosphamide  chemotherapy. Patient taking differently: Take 8 mg by mouth daily as needed for nausea or vomiting. 07/26/22  Yes Onesimo Emaline Brink, MD  oxyCODONE  (OXY IR/ROXICODONE ) 5 MG immediate release tablet Take 1  tablet (5 mg total) by mouth every 6 (six) hours as needed for severe pain. 08/28/22  Yes Ghimire, Donalda HERO, MD  pantoprazole  (PROTONIX ) 40 MG tablet Take 1 tablet (40 mg total) by mouth 2 (two) times daily. For 12 weeks Patient taking differently: Take 40 mg by mouth daily. 08/28/22 03/07/23 Yes Ghimire, Donalda HERO, MD  Tenofovir  Alafenamide Fumarate (VEMLIDY ) 25 MG TABS Take 1 tablet (25 mg total) by mouth daily. 10/29/22  Yes Dennise Kingsley, MD  allopurinol  (ZYLOPRIM ) 100 MG tablet Take 100 mg by mouth 2 (two) times daily. Patient not taking: Reported on 03/07/2023 02/13/23   [provider]  predniSONE  (DELTASONE ) 50 MG tablet TAKE 1 TABLET BY MOUTH EVERY DAY WITH BREAKFAST FOR 5 DAYS STARTING THE DAY AFTER EACH CHEMOTHERAPY Patient not taking: Reported on 03/07/2023 11/16/22   Onesimo Emaline Brink, MD     Critical care time:  Christino Mcglinchey D. Harris, NP-C [facility] Personal contact information can be found on Amion  If no contact or response made please call [phone] 03/10/2023, 6:00 PM

## 2023-03-10 NOTE — Progress Notes (Signed)
 eLink Physician-Brief Progress Note Patient Name: Tyler [name] DOB: 07/04/1950 MRN: [id number]   Date of Service  03/10/2023  HPI/Events of Note  Tyler [name] not functioning appropriately, not correlating with cuff  eICU Interventions  Trend sphygmomanometry more frequently. Remove arterial line      Intervention Category Minor Interventions: Routine modifications to care plan (e.g. PRN medications for pain, fever)  Tyler [name] 03/10/2023, 8:15 PM

## 2023-03-11 DIAGNOSIS — C8330 Diffuse large B-cell lymphoma, unspecified site: Secondary | ICD-10-CM

## 2023-03-11 DIAGNOSIS — I482 Chronic atrial fibrillation, unspecified: Secondary | ICD-10-CM

## 2023-03-11 DIAGNOSIS — D496 Neoplasm of unspecified behavior of brain: Secondary | ICD-10-CM | POA: Diagnosis not present

## 2023-03-11 LAB — BASIC METABOLIC PANEL
Anion gap: 9 (ref 5–15)
BUN: 23 mg/dL (ref 8–23)
CO2: 24 mmol/L (ref 22–32)
Calcium: 8.4 mg/dL — ABNORMAL LOW (ref 8.9–10.3)
Chloride: 108 mmol/L (ref 98–111)
Creatinine, Ser: 0.98 mg/dL (ref 0.61–1.24)
GFR, Estimated: >60 mL/min (ref >60)
Glucose, Bld: 116 mg/dL — ABNORMAL HIGH (ref 70–99)
Potassium: 3.9 mmol/L (ref 3.5–5.1)
Sodium: 141 mmol/L (ref 135–145)

## 2023-03-11 LAB — CBC
HCT: 41.6 % (ref 39.0–52.0)
Hemoglobin: 14.3 g/dL (ref 13.0–17.0)
MCH: 30.6 pg (ref 26.0–34.0)
MCHC: 34.4 g/dL (ref 30.0–36.0)
MCV: 89.1 fL (ref 80.0–100.0)
Platelets: 160 10*3/uL (ref 150–400)
RBC: 4.67 MIL/uL (ref 4.22–5.81)
RDW: 12.4 % (ref 11.5–15.5)
WBC: 8.5 10*3/uL (ref 4.0–10.5)
nRBC: 0 % (ref 0.0–0.2)

## 2023-03-11 NOTE — Progress Notes (Signed)
 Subjective: The patient is alert and pleasant.  He looks well.  He has no complaints.  Objective: Vital signs in last 24 hours: Temp:  [97.7 F (36.5 C)-98.9 F (37.2 C)] 98.9 F (37.2 C) (01/14 0400) Pulse Rate:  [51-94] 62 (01/14 0600) Resp:  [11-26] 18 (01/14 0600) BP: (130-183)/(64-108) 146/87 (01/14 0600) SpO2:  [87 %-99 %] 93 % (01/14 0600) Arterial Line BP: (158-177)/(82-98) 171/90 (01/13 2000) Weight:  [87.5 kg] 87.5 kg (01/13 1322) Estimated body mass index is 30.23 kg/m as calculated from the following:   Height as of this encounter: 5' 7 (1.702 m).   Weight as of this encounter: 87.5 kg.   Intake/Output from previous day: 01/13 0701 - 01/14 0700 In: 1800 [I.V.:1600; IV Piggyback:200] Out: 975 [Urine:775; Blood:200] Intake/Output this shift: No intake/output data recorded.  Physical exam the patient is alert and oriented.  His strength is normal.  His speech is normal.  His dressing is clean and dry.  Lab Results: Recent Labs    03/10/23 0515 03/11/23 0522  WBC 6.8 8.5  HGB 15.1 14.3  HCT 43.5 41.6  PLT 180 160   BMET Recent Labs    03/10/23 0515 03/11/23 0522  NA 138 141  K 3.9 3.9  CL 103 108  CO2 25 24  GLUCOSE 124* 116*  BUN 23 23  CREATININE 1.03 0.98  CALCIUM  8.8* 8.4*    Studies/Results: No results found.  Assessment/Plan: Postop day #1 status post right parietal craniotomy for tumor: The patient is doing well.  The primary diagnosis is lymphoma.  We will transfer out of the ICU.  He can probably go home in a day or 2.  LOS: 4 days     Tyler [name] 03/11/2023, 7:50 AM     Patient ID: Tyler [name], male   DOB: [birth year], 73 y.o.   MRN: [id number]

## 2023-03-11 NOTE — Progress Notes (Signed)
 Inpatient Rehabilitation Admissions Coordinator   I will place rehab consult order for full assessment for candidacy to admit to CIR.  Ottie Glazier, RN, MSN Rehab Admissions Coordinator [phone] 03/11/2023 5:52 PM

## 2023-03-11 NOTE — Progress Notes (Signed)
 Attempted to call Rn to Rn report to [facility] at this time. Per recep, she will call back

## 2023-03-11 NOTE — Evaluation (Signed)
 Physical Therapy Re-Evaluation s/p craniotomy Patient Details Name: Tyler [name] MRN: [id number] DOB: January 12, 1951 Today's Date: 03/11/2023  History of Present Illness  73 yo male presents to [facility] on 1/9 with LUE and LE weakness, headaches, fall. MRI brain showed R parietal lobe mass with surrounding vasogenic edema and 4 mm of right-to-left shift-differential includes solitary intracranial metastasis versus primary CNS neoplasm. S/p R parietal craniotomy 1/13. 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  Clinical Impression   Pt presents with generalized weakness, LUE and LE incoordination, impaired balance, impulsivity throughout, and decreased activity tolerance. Pt to benefit from acute PT to address deficits. Pt ambulated short room distance to and from bathroom in compliance with orders with min steadying assist and no AD, pt indicates he feels weak but is improving. Plan remains appropriate. PT to progress mobility as tolerated, and will continue to follow acutely.          If plan is discharge home, recommend the following: A little help with walking and/or transfers;A little help with bathing/dressing/bathroom;Assistance with cooking/housework;Direct supervision/assist for medications management;Direct supervision/assist for financial management;Assist for transportation;Help with stairs or ramp for entrance;Supervision due to cognitive status   Can travel by private vehicle        Equipment Recommendations Other (comment) (defer)  Recommendations for Other Services       Functional Status Assessment Patient has had a recent decline in their functional status and demonstrates the ability to make significant improvements in function in a reasonable and predictable amount of time.     Precautions / Restrictions Precautions Precautions:  Fall Precaution Comments: new crani Restrictions Weight Bearing Restrictions Per Provider Order: No      Mobility  Bed Mobility Overal bed mobility: Needs Assistance             General bed mobility comments: sitting EOB upon PT arrival    Transfers Overall transfer level: Needs assistance Equipment used: 1 person hand held assist Transfers: Sit to/from Stand Sit to Stand: Min assist           General transfer comment: light rise and steady assist    Ambulation/Gait Ambulation/Gait assistance: Min assist Gait Distance (Feet): 25 Feet (to and from bathroom only, per orders bedrest with bathroom privileges) Assistive device: 1 person hand held assist Gait Pattern/deviations: Step-through pattern, Decreased stride length, Decreased weight shift to left Gait velocity: decr     General Gait Details: assist to steady, cues for room navigation and avoiding obstacles. Pt reaching for environment to self-steady. x1 LOB  Stairs            Wheelchair Mobility     Tilt Bed    Modified Rankin (Stroke Patients Only)       Balance Overall balance assessment: Needs assistance Sitting-balance support: Feet supported Sitting balance-Leahy Scale: Good     Standing balance support: Single extremity supported, During functional activity Standing balance-Leahy Scale: Fair                               Pertinent Vitals/Pain Pain Assessment Pain Assessment: Faces Faces Pain Scale: Hurts even more Pain Location: Headache Pain Descriptors / Indicators: Sore, Discomfort Pain Intervention(s): Limited activity within patient's tolerance, Monitored during session, Repositioned    Home Living Family/patient expects to be discharged to:: Private residence Living Arrangements: Spouse/significant other Available Help at  Discharge: Family Type of Home: Apartment Home Access: Level entry Entrance Stairs-Rails: Right;Left Entrance Stairs-Number of Steps:  flight   Home Layout: One level Home Equipment: Agricultural Consultant (2 wheels);BSC/3in1      Prior Function Prior Level of Function : Independent/Modified Independent             Mobility Comments: occasional RW use ADLs Comments: Independent to Mod I with occasional assistance from spouse.     Extremity/Trunk Assessment   Upper Extremity Assessment Upper Extremity Assessment: Defer to OT evaluation LUE Deficits / Details: min incoordination noted LUE functionally LUE Coordination: decreased fine motor;decreased gross motor    Lower Extremity Assessment Lower Extremity Assessment: Generalized weakness;LLE deficits/detail LLE Deficits / Details: at least 3+/5 throughout    Cervical / Trunk Assessment Cervical / Trunk Assessment: Normal  Communication   Communication Communication: No apparent difficulties (swahili is pt's primary language, but speaks english well and declines need for interpreter) Cueing Techniques: Verbal cues;Gestural cues  Cognition Arousal: Alert Behavior During Therapy: Impulsive Overall Cognitive Status: No family/caregiver present to determine baseline cognitive functioning Area of Impairment: Following commands, Safety/judgement, Awareness                       Following Commands: Follows one step commands with increased time Safety/Judgement: Decreased awareness of safety, Decreased awareness of deficits Awareness: Emergent   General Comments: can be impulsive, requires cues to wait for PT assist. tangential in conversation        General Comments      Exercises     Assessment/Plan    PT Assessment Patient needs continued PT services  PT Problem List Decreased strength;Decreased activity tolerance;Decreased mobility;Decreased balance;Decreased cognition;Decreased knowledge of use of DME;Decreased safety awareness       PT Treatment Interventions DME instruction;Gait training;Therapeutic exercise;Therapeutic  activities;Functional mobility training;Balance training;Neuromuscular re-education;Cognitive remediation;Patient/family education    PT Goals (Current goals can be found in the Care Plan section)  Acute Rehab PT Goals Patient Stated Goal: none stated PT Goal Formulation: With patient Time For Goal Achievement: 03/22/23 Potential to Achieve Goals: Good    Frequency Min 1X/week     Co-evaluation               AM-PAC PT 6 Clicks Mobility  Outcome Measure Help needed turning from your back to your side while in a flat bed without using bedrails?: A Little Help needed moving from lying on your back to sitting on the side of a flat bed without using bedrails?: A Little Help needed moving to and from a bed to a chair (including a wheelchair)?: A Little Help needed standing up from a chair using your arms (e.g., wheelchair or bedside chair)?: A Little Help needed to walk in hospital room?: A Little Help needed climbing 3-5 steps with a railing? : A Lot 6 Click Score: 17    End of Session   Activity Tolerance: Patient tolerated treatment well Patient left: in bed;with bed alarm set;with call bell/phone within reach Nurse Communication: Mobility status PT Visit Diagnosis: Unsteadiness on feet (R26.81);Other abnormalities of gait and mobility (R26.89);Repeated falls (R29.6);Muscle weakness (generalized) (M62.81);Other symptoms and signs involving the nervous system (R29.898)    Time: 0940-1002 PT Time Calculation (min) (ACUTE ONLY): 22 min   Charges:   PT Evaluation $PT Re-evaluation: 1 Re-eval   PT General Charges $$ ACUTE PT VISIT: 1 Visit         Mardella Nuckles S, PT DPT Acute Rehabilitation Services Secure Chat Preferred  Office [phone]   Charnae Lill E Stroup 03/11/2023, 12:40 PM

## 2023-03-11 NOTE — Plan of Care (Signed)
  Problem: Clinical Measurements: Goal: Respiratory complications will improve Outcome: Progressing Goal: Cardiovascular complication will be avoided Outcome: Progressing   Problem: Activity: Goal: Risk for activity intolerance will decrease Outcome: Progressing   Problem: Nutrition: Goal: Adequate nutrition will be maintained Outcome: Progressing   Problem: Clinical Measurements: Goal: Usual level of consciousness will be regained or maintained. Outcome: Progressing Goal: Neurologic status will improve Outcome: Progressing

## 2023-03-11 NOTE — Plan of Care (Signed)
   Problem: Education: Goal: Knowledge of General Education information will improve Description: Including pain rating scale, medication(s)/side effects and non-pharmacologic comfort measures Outcome: Progressing   Problem: Clinical Measurements: Goal: Ability to maintain clinical measurements within normal limits will improve Outcome: Progressing

## 2023-03-11 NOTE — Progress Notes (Signed)
 Pt transported to 4NP07 in no distress. Cardiac monitor to be placed. Pt's RN informed of pt's arrival and need for cardiac monitoring to be placed. Tresa Endo verbalized an understanding.

## 2023-03-11 NOTE — TOC CM/SW Note (Signed)
 Transition of Care [facility]) - Inpatient Brief Assessment   Patient Details  Name: Tyler [name] MRN: [id number] Date of Birth: 1950/10/19  Transition of Care [facility]) CM/SW Contact:    Inocente GORMAN Kindle, LCSW Phone Number: 03/11/2023, 12:48 PM   Clinical Narrative: TOC following for CIR determination of candidacy.    Transition of Care Asessment: Insurance and Status: Insurance coverage has been reviewed Patient has primary care physician: Yes Home environment has been reviewed: lives with daughter Prior level of function:: independent Prior/Current Home Services: No current home services Social Drivers of Health Review: SDOH reviewed no interventions necessary Readmission risk has been reviewed: Yes Transition of care needs: no transition of care needs at this time

## 2023-03-11 NOTE — Progress Notes (Signed)
 Pt transfer from 4N-30 to 4NP-07. A&O x 4 with 3/10 headache pain. Refused pain medication for now. Honeycomb dressing to head with old drainage intact. VSS and focused assessment completed. No cardiac tele monitoring order. Pt oriented to unit, call bell within reach, and bed alarm on.

## 2023-03-11 NOTE — Anesthesia Postprocedure Evaluation (Signed)
 Anesthesia Post Note  Patient: Tyler [name]  Procedure(s) Performed: PARIETAL CRANIOTOMY (Right: Head) APPLICATION OF CRANIAL NAVIGATION (Right)     Patient location during evaluation: PACU Anesthesia Type: General Level of consciousness: sedated and patient cooperative Pain management: pain level controlled Vital Signs Assessment: post-procedure vital signs reviewed and stable Respiratory status: spontaneous breathing Cardiovascular status: stable Anesthetic complications: no   No notable events documented.  Last Vitals:  Vitals:   03/11/23 0800 03/11/23 0817  BP: (!) 151/97 (!) 159/112  Pulse: 64 (!) 59  Resp: 17 15  Temp: 36.7 C   SpO2: 94% 94%    Last Pain:  Vitals:   03/11/23 0800  TempSrc: Oral  PainSc:                  [name]

## 2023-03-11 NOTE — Progress Notes (Signed)
 Pt's daughter Karena Addison called at this time to inform her that Mr. Pralle has been transferred to 4NP07.

## 2023-03-11 NOTE — Progress Notes (Signed)
 NAME:  Tyler [name], MRN:  [id number], DOB:  [birth year], LOS: 4 ADMISSION DATE:  03/06/2023, CONSULTATION DATE:  03/10/2023 REFERRING MD:  Dr. 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 Head CT on admission concerning for metastatic disease, MRI brain confirmed right parietal lobe mass with surrounding vasogenic edema and 4 mm right-to-left shift.  Neurosurgery consulted and Decadron  initiated.   Neurosurgery discussed clinical options with patient and decision made to proceed with elective craniotomy with tumor debulking, procedure completed 1/13 with Dr. Mavis.   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 Head CT on admission concerning for metastatic disease, MRI brain confirmed right parietal lobe mass with surrounding vasogenic edema and 4 mm right-to-left shift. 1/13 elective craniotomy for tumor debulking   Interim History / Subjective:  Tyler [name] denies complaints-- morphine  helped his incisional pain earlier.  Not requiring continuous infusions.  Afebrile overnight.   Objective   Blood pressure (!) 139/103, pulse 72, temperature 98 F (36.7  C), temperature source Oral, resp. rate 14, height 5' 7 (1.702 m), weight 87.5 kg, SpO2 93%.        Intake/Output Summary (Last 24 hours) at 03/11/2023 1022 Last data filed at 03/11/2023 0200 Gross per 24 hour  Intake 1800 ml  Output 975 ml  Net 825 ml   Filed Weights   03/10/23 1322  Weight: 87.5 kg    Examination: General: chronically ill appearing man lying in bed in NAD HEENT: surgical incision with honeycomb dressing, staples. No active bleeding Neuro:  awake, alert, answering questions appropriately, able to lift both arms off the bed, moving BLE.  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 Underwent right craniotomy with tumor debulking 1/13 with Dr. Mavis. -con't management per NS -dexamethasone  wean -needs follow up with oncology about change in regimen for his lymphoma when he is stable -neuroprotective measures -neuro checks, transferring out of ICU today per NS -goal SBP <150; has hydralazine  PRN -keppra  for seizure prophylaxis -norco & morphine  PRN for pain control -PT, OT, progress mobility as able  Diffuse large B-cell lymphoma -Initially diagnosed Jul 25, 2022 started on chemo. Now with brain met. History of myelodysplastic syndrome History of prostate cancer -management per Oncology    Paroxysmal atrial fibrillation not anticoagulated at baseline due to high risk bleeding Sinus bradycardia Essential hypertension -con't PTA toprol  -con't holding DOAC; not a good candidate to restart with brain tumor -tele monitoring -monitor electrolytes, replete as needed  Chronic hepatitis B -con't PTA tenofovir   Transferring to the floor. PCCM will be available  as needed.  Best Practice (right click and Reselect all SmartList Selections daily)   Diet/type: Regular consistency (see orders) DVT prophylaxis SCD Pressure ulcer(s): N/A GI prophylaxis: PPI Lines: N/A Foley:  Yes, and it is still needed Code Status:  full code Last date of multidisciplinary goals of care discussion: Per primary   Labs   CBC: Recent Labs  Lab 03/06/23 2020 03/10/23 0515 03/11/23 0522  WBC 4.6 6.8 8.5  HGB 14.6 15.1 14.3  HCT 42.7 43.5 41.6  MCV 90.5 88.1 89.1  PLT 160 180 160    Basic Metabolic Panel: Recent Labs  Lab 03/06/23 2020 03/10/23 0515 03/11/23 0522  NA 140 138 141  K 3.6 3.9 3.9  CL 105 103 108  CO2 25 25 24   GLUCOSE 116* 124* 116*  BUN 9 23 23   CREATININE 0.88 1.03 0.98  CALCIUM  9.1 8.8* 8.4*   GFR: Estimated Creatinine Clearance: 74.1 mL/min (by C-G formula based on SCr of 0.98 mg/dL). Recent Labs  Lab 03/06/23 2020 03/10/23 0515 03/11/23 0522  WBC 4.6 6.8 8.5      Critical care time:       Tyler SHAUNNA Gaskins, DO 03/11/23 11:40 AM [facility]  For contact information, see Amion. If no response to pager, please call PCCM consult pager. After hours, 7PM- 7AM, please call Elink.

## 2023-03-11 NOTE — Progress Notes (Signed)
 Pt mentioned to me that he had clothing when he was admitted. I reached out to pt's daughter Johnston and she endorses pt did have clothing upon admission. [facility] called at this time and confirms that they do have pt's belonings. This will be communicated to Burnard Slight, RN 4NP

## 2023-03-12 DIAGNOSIS — D496 Neoplasm of unspecified behavior of brain: Secondary | ICD-10-CM | POA: Diagnosis not present

## 2023-03-12 NOTE — Plan of Care (Signed)
  Problem: Education: Goal: Knowledge of General Education information will improve Description: Including pain rating scale, medication(s)/side effects and non-pharmacologic comfort measures Outcome: Progressing   Problem: Clinical Measurements: Goal: Will remain free from infection Outcome: Progressing Goal: Respiratory complications will improve Outcome: Progressing   Problem: Activity: Goal: Risk for activity intolerance will decrease Outcome: Progressing   Problem: Nutrition: Goal: Adequate nutrition will be maintained Outcome: Progressing   Problem: Coping: Goal: Level of anxiety will decrease Outcome: Progressing   Problem: Elimination: Goal: Will not experience complications related to urinary retention Outcome: Progressing   Problem: Pain Management: Goal: General experience of comfort will improve Outcome: Progressing

## 2023-03-12 NOTE — Progress Notes (Signed)
 Physical Therapy Treatment Patient Details Name: Tyler [name] MRN: [id number] DOB: Jun 27, 1950 Today's Date: 03/12/2023   History of Present Illness 73 yo male presents to [facility] on 1/9 with LUE and LE weakness, headaches, fall. MRI brain showed R parietal lobe mass with surrounding vasogenic edema and 4 mm of right-to-left shift-differential includes solitary intracranial metastasis versus primary CNS neoplasm. S/p R parietal craniotomy 1/13. 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    PT Comments  Pt showing good progress toward goals.  Emphasis on safety in general and gait safety, stability with scanning, abrupt changes in speed and direction plus safe negotiation of stairs, stress on L LE coordination.     If plan is discharge home, recommend the following: A little help with walking and/or transfers;A little help with bathing/dressing/bathroom;Assistance with cooking/housework;Direct supervision/assist for medications management;Direct supervision/assist for financial management;Assist for transportation;Help with stairs or ramp for entrance;Supervision due to cognitive status   Can travel by private vehicle        Equipment Recommendations  Other (comment)    Recommendations for Other Services Rehab consult     Precautions / Restrictions Precautions Precautions: Fall Precaution Comments: new crani     Mobility  Bed Mobility Overal bed mobility: Needs Assistance Bed Mobility: Supine to Sit     Supine to sit: Supervision Sit to supine: Supervision   General bed mobility comments: Not independent due to impulsivities    Transfers Overall transfer level: Needs assistance   Transfers: Sit to/from Stand Sit to Stand: Contact guard assist           General transfer comment: CGA for safety    Ambulation/Gait Ambulation/Gait  assistance: Min assist, Contact guard assist Gait Distance (Feet): 150 Feet (x2 with  change of activity in between)   Gait Pattern/deviations: Step-through pattern   Gait velocity interpretation: 1.31 - 2.62 ft/sec, indicative of limited community ambulator   General Gait Details: mild unsteadiness with notable improvement over the gait trials.  Noted mildly halted swing through with less heel toe, but L LE affecting balance less with time in activity.  Also scanning produced mild instability initially and less as he scanned more.   Stairs Stairs: Yes Stairs assistance: Min assist Stair Management: One rail Right, Alternating pattern, Forwards Number of Stairs: 4 (x2) General stair comments: uncoordinate/stacatto step up with L LE, more controlled down.  Safe with the rail   Wheelchair Mobility     Tilt Bed    Modified Rankin (Stroke Patients Only)       Balance Overall balance assessment: Needs assistance Sitting-balance support: Feet supported Sitting balance-Leahy Scale: Good     Standing balance support: Single extremity supported, During functional activity Standing balance-Leahy Scale: Fair                              Cognition Arousal: Alert Behavior During Therapy: Impulsive Overall Cognitive Status: No family/caregiver present to determine baseline cognitive functioning                         Following Commands: Follows one step commands consistently, Follows one step commands with increased time   Awareness: Emergent            Exercises      General Comments        Pertinent Vitals/Pain  Pain Assessment Pain Assessment: Faces Faces Pain Scale: No hurt Pain Intervention(s): Monitored during session    Home Living                          Prior Function            PT Goals (current goals can now be found in the care plan section) Acute Rehab PT Goals PT Goal Formulation: With patient Time For Goal  Achievement: 03/22/23 Potential to Achieve Goals: Good Progress towards PT goals: Progressing toward goals    Frequency    Min 1X/week      PT Plan      Co-evaluation              AM-PAC PT "6 Clicks" Mobility   Outcome Measure  Help needed turning from your back to your side while in a flat bed without using bedrails?: A Little Help needed moving from lying on your back to sitting on the side of a flat bed without using bedrails?: A Little Help needed moving to and from a bed to a chair (including a wheelchair)?: A Little Help needed standing up from a chair using your arms (e.g., wheelchair or bedside chair)?: A Little Help needed to walk in hospital room?: A Little Help needed climbing 3-5 steps with a railing? : A Little 6 Click Score: 18    End of Session   Activity Tolerance: Patient tolerated treatment well Patient left: in bed;with bed alarm set;with call bell/phone within reach Nurse Communication: Mobility status PT Visit Diagnosis: Unsteadiness on feet (R26.81);Other abnormalities of gait and mobility (R26.89);Repeated falls (R29.6);Muscle weakness (generalized) (M62.81);Other symptoms and signs involving the nervous system (R29.898)     Time: 6295-2841 PT Time Calculation (min) (ACUTE ONLY): 22 min  Charges:    $Gait Training: 8-22 mins PT General Charges $$ ACUTE PT VISIT: 1 Visit                     03/12/2023  Nohemi Batters., PT Acute Rehabilitation Services [phone]  (office)   Tyler [name] 03/12/2023, 5:25 PM

## 2023-03-12 NOTE — Consult Note (Signed)
 Physical Medicine and Rehabilitation Consult Reason for Consult: Brain tumor with vasogenic edema Referring Physician: Garry Kansas, MD   HPI: Tyler [name] is a 73 y.o. male who presented to [facility] on 1/9 with LUE and LE weakness, headaches, and fall. MRI brain showed a right parietal lobe mass with surrounding vasogenic edema and 4mm of right to left shift. Differential includes solitary intracranial metastasis versus primary CNS neoplasm. He is s/p right parietal craniotomy 1/13. PMH includes prostate cancer, chronic hepatitis B on tenofovir , MDS high grade large B cell lymphoma s/p CHOP 07/2022 complicated by severe pancytopenia and tumor lysis syndrome. Physical Medicine & Rehabilitation was consulted to assess candidacy for CIR.   He currently has good strength but is impulsive and has decreased awareness of his deficits.   ROS denies pain Past Medical History:  Diagnosis Date   Cancer [facility])    prostate   Follicular lymphoma (HCC) 2024   History of pulmonary embolism    Hypertension    Myelodysplastic syndrome (HCC)    PAF (paroxysmal atrial fibrillation) (HCC)    Recurrent UTI    Past Surgical History:  Procedure Laterality Date   BIOPSY  08/21/2022   Procedure: BIOPSY;  Surgeon: Nannette Babe, MD;  Location: [facility] ENDOSCOPY;  Service: Gastroenterology;;   ESOPHAGOGASTRODUODENOSCOPY (EGD) WITH PROPOFOL  N/A 08/21/2022   Procedure: ESOPHAGOGASTRODUODENOSCOPY (EGD) WITH PROPOFOL ;  Surgeon: Nannette Babe, MD;  Location[facility] ENDOSCOPY;  Service: Gastroenterology;  Laterality: N/A;   IR IMAGING GUIDED PORT INSERTION  08/26/2022   LYMPHADENECTOMY Bilateral 09/01/2019   Procedure: LYMPHADENECTOMY;  Surgeon: Osborn Blaze, MD;  Location: [facility] ORS;  Service: Urology;  Laterality: Bilateral;   ROBOT ASSISTED LAPAROSCOPIC RADICAL PROSTATECTOMY N/A 09/01/2019   Procedure: XI ROBOTIC ASSISTED LAPAROSCOPIC RADICAL PROSTATECTOMY;  Surgeon: Osborn Blaze, MD;  Location: [facility] ORS;  Service:  Urology;  Laterality: N/A;  3 HRS   Family History  Problem Relation Age of Onset   Stroke Father    Social History:  reports that he has never smoked. He has never used smokeless tobacco. He reports current alcohol  use. He reports that he does not use drugs. Allergies:  Allergies  Allergen Reactions   Amitriptyline  Other (See Comments)    Nightmares, Night sweats, Migraines    Amlodipine Swelling    BILATERAL FEET TO ANKLES   Neurontin  [Gabapentin ] Other (See Comments)    Nightmares; Night sweats; Headache   Medications Prior to Admission  Medication Sig Dispense Refill   B Complex Vitamins (VITAMIN B COMPLEX ) TABS Take 1 tablet by mouth daily. (Patient taking differently: Take 2 tablets by mouth daily.) 30 tablet 0   cholecalciferol  (VITAMIN D3) 25 MCG (1000 UNIT) tablet Take 2 tablets (2,000 Units total) by mouth daily. (Patient taking differently: Take 1,000 Units by mouth daily.)     clotrimazole -betamethasone  (LOTRISONE ) cream APPLY TO AFFECTED AREA TWICE A DAY (Patient taking differently: Apply 1 Application topically as needed (itching).) 45 g 0   diclofenac  Sodium (VOLTAREN ) 1 % GEL APPLY 4 G TOPICALLY 4 (FOUR) TIMES DAILY. APPLY TO RIGHT LATERAL HIP AND THIGH DOWN TO KNEE (Patient taking differently: Apply 1 Application topically daily as needed (For pain).) 300 g 1   folic acid  (FOLVITE ) 1 MG tablet Take 1 mg by mouth daily.     furosemide  (LASIX ) 20 MG tablet Take 1 tablet (20 mg total) by mouth daily. (Patient taking differently: Take 20 mg by mouth daily as needed for fluid or edema.) 90 tablet 1   KLOR-CON   M20 20 MEQ tablet TAKE 1 TABLET BY MOUTH TWICE A DAY (Patient taking differently: Take 20 mEq by mouth daily as needed (when taking the lasix ).) 180 tablet 1   lidocaine -prilocaine  (EMLA ) cream Apply to affected area once (Patient taking differently: Apply 1 Application topically as needed (port).) 30 g 3   Menthol-Methyl Salicylate (MUSCLE RUB) 10-15 % CREA Apply 1  Application topically daily as needed for muscle pain.     metoCLOPramide  (REGLAN ) 10 MG tablet Take 10 mg by mouth daily as needed for vomiting or nausea.     metoprolol  succinate (TOPROL  XL) 25 MG 24 hr tablet Take 1 tablet (25 mg total) by mouth daily. 90 tablet 3   Multiple Vitamin (MULTIVITAMIN WITH MINERALS) TABS tablet Take 1 tablet by mouth daily.     ondansetron  (ZOFRAN ) 8 MG tablet Take 1 tablet (8 mg total) by mouth every 8 (eight) hours as needed for nausea or vomiting. Start on the third day after cyclophosphamide  chemotherapy. (Patient taking differently: Take 8 mg by mouth daily as needed for nausea or vomiting.) 30 tablet 1   oxyCODONE  (OXY IR/ROXICODONE ) 5 MG immediate release tablet Take 1 tablet (5 mg total) by mouth every 6 (six) hours as needed for severe pain. 15 tablet 0   pantoprazole  (PROTONIX ) 40 MG tablet Take 1 tablet (40 mg total) by mouth 2 (two) times daily. For 12 weeks (Patient taking differently: Take 40 mg by mouth daily.)  \   Tenofovir  Alafenamide Fumarate (VEMLIDY ) 25 MG TABS Take 1 tablet (25 mg total) by mouth daily. 30 tablet 5   allopurinol  (ZYLOPRIM ) 100 MG tablet Take 100 mg by mouth 2 (two) times daily. (Patient not taking: Reported on 03/07/2023)     predniSONE  (DELTASONE ) 50 MG tablet TAKE 1 TABLET BY MOUTH EVERY DAY WITH BREAKFAST FOR 5 DAYS STARTING THE DAY AFTER EACH CHEMOTHERAPY (Patient not taking: Reported on 03/07/2023) 25 tablet 0    Home: Home Living Family/patient expects to be discharged to:: Private residence Living Arrangements: Spouse/significant other Available Help at Discharge: Family Type of Home: Apartment Home Access: Level entry Entrance Stairs-Number of Steps: flight Entrance Stairs-Rails: Right, Left Home Layout: One level Bathroom Shower/Tub: Engineer, manufacturing systems: Standard Bathroom Accessibility: Yes Home Equipment: Agricultural consultant (2 wheels), BSC/3in1  Functional History: Prior Function Prior Level of Function  : Independent/Modified Independent Mobility Comments: occasional RW use ADLs Comments: Independent to Mod I with occasional assistance from spouse. Functional Status:  Mobility: Bed Mobility Overal bed mobility: Needs Assistance Bed Mobility: Supine to Sit Supine to sit: Supervision Sit to supine: Contact guard assist General bed mobility comments: patient attempting to lower rail himself, instructed patient to wait for assistance for safety Transfers Overall transfer level: Needs assistance Equipment used: 1 person hand held assist Transfers: Sit to/from Stand Sit to Stand: Contact guard assist General transfer comment: CGA for safety Ambulation/Gait Ambulation/Gait assistance: Min assist Gait Distance (Feet): 25 Feet (to and from bathroom only, per orders "bedrest with bathroom privileges") Assistive device: 1 person hand held assist Gait Pattern/deviations: Step-through pattern, Decreased stride length, Decreased weight shift to left General Gait Details: assist to steady, cues for room navigation and avoiding obstacles. Pt reaching for environment to self-steady. x1 LOB Gait velocity: decr    ADL: ADL Overall ADL's : Needs assistance/impaired Eating/Feeding: Set up, Sitting Grooming: Wash/dry hands, Wash/dry face, Oral care, Contact guard assist, Standing Grooming Details (indicate cue type and reason): at sink Upper Body Bathing: Supervision/ safety, Standing Lower Body Bathing: Contact  guard assist, Sit to/from stand Lower Body Bathing Details (indicate cue type and reason): peri area bathing Upper Body Dressing : Standing, Contact guard assist Upper Body Dressing Details (indicate cue type and reason): gown for back Lower Body Dressing: Supervision/safety, Sitting/lateral leans Lower Body Dressing Details (indicate cue type and reason): to donn socks Toilet Transfer: Contact guard assist, Ambulation, Regular Toilet General ADL Comments: one incident of LOB during self  care  Cognition: Cognition Overall Cognitive Status: No family/caregiver present to determine baseline cognitive functioning Orientation Level: Oriented X4 Cognition Arousal: Alert Behavior During Therapy: Impulsive Overall Cognitive Status: No family/caregiver present to determine baseline cognitive functioning Area of Impairment: Following commands, Safety/judgement, Awareness Orientation Level: Disoriented to, Time Following Commands: Follows one step commands with increased time Safety/Judgement: Decreased awareness of safety, Decreased awareness of deficits Awareness: Emergent General Comments: cues for safety, Tangential conversation  Blood pressure (!) 152/97, pulse 93, temperature 98 F (36.7 C), temperature source Oral, resp. rate 18, height 5\' 7"  (1.702 m), weight 87.5 kg, SpO2 93%. Physical Exam Gen: no distress, normal appearing HEENT: oral mucosa pink and moist, NCAT Cardio: Reg rate Chest: normal effort, normal rate of breathing Abd: soft, non-distended Ext: no edema Psych: pleasant, normal affect Skin: intact Neuro: Alert and oriented x3, moving extremities well with excellent strength, impulsive with decreases safety awarness  No results found for this or any previous visit (from the past 24 hours). No results found.  Assessment/Plan: Diagnosis: Brain tumor with vasogenic edema Does the need for close, 24 hr/day medical supervision in concert with the patient's rehab needs make it unreasonable for this patient to be served in a less intensive setting? Yes Co-Morbidities requiring supervision/potential complications:  1) Obesity: provide dietary education 2)Constipation: continue colace BID 3) Vasogenic edema: continue steroids 4) Tachycardia: continue metoprolol  5) Postoperative pain: consider transitioning IV morphine  to oral morphine  Due to bladder management, bowel management, safety, skin/wound care, disease management, medication administration, pain  management, and patient education, does the patient require 24 hr/day rehab nursing? Yes Does the patient require coordinated care of a physician, rehab nurse, therapy disciplines of PT, OT, SLP to address physical and functional deficits in the context of the above medical diagnosis(es)? Yes Addressing deficits in the following areas: balance, endurance, locomotion, strength, transferring, bowel/bladder control, bathing, dressing, feeding, grooming, toileting, and cognition Can the patient actively participate in an intensive therapy program of at least 3 hrs of therapy per day at least 5 days per week? Yes The potential for patient to make measurable gains while on inpatient rehab is excellent Anticipated functional outcomes upon discharge from inpatient rehab are supervision  with PT, supervision with OT, supervision with SLP. Estimated rehab length of stay to reach the above functional goals is: 6-9 days Anticipated discharge destination: Home Overall Rehab/Functional Prognosis: excellent  POST ACUTE RECOMMENDATIONS: This patient's condition is appropriate for continued rehabilitative care in the following setting: CIR Patient has agreed to participate in recommended program. Yes Note that insurance prior authorization may be required for reimbursement for recommended care.     I have personally performed a face to face diagnostic evaluation of this patient. Additionally, I have examined the patient's medical record including any pertinent labs and radiographic images. If the physician assistant has documented in this note, I have reviewed and edited or otherwise concur with the physician assistant's documentation.  Thanks,  Liam Redhead, MD 03/12/2023

## 2023-03-12 NOTE — Progress Notes (Signed)
 Subjective: The patient is alert and pleasant.  He looks well.  He has no complaints.  Objective: Vital signs in last 24 hours: Temp:  [97.6 F (36.4 C)-98.9 F (37.2 C)] 97.6 F (36.4 C) (01/15 0822) Pulse Rate:  [54-85] 54 (01/15 0822) Resp:  [14-21] 16 (01/15 0822) BP: (126-159)/(74-113) 145/86 (01/15 0822) SpO2:  [91 %-97 %] 95 % (01/15 0822) Estimated body mass index is 30.23 kg/m as calculated from the following:   Height as of this encounter: 5\' 7"  (1.702 m).   Weight as of this encounter: 87.5 kg.   Intake/Output from previous day: 01/14 0701 - 01/15 0700 In: 100 [IV Piggyback:100] Out: 550 [Urine:550] Intake/Output this shift: No intake/output data recorded.  Physical exam the patient is alert and pleasant.  He is moving all 4 extremities well.  He is oriented.  Lab Results: Recent Labs    03/10/23 0515 03/11/23 0522  WBC 6.8 8.5  HGB 15.1 14.3  HCT 43.5 41.6  PLT 180 160   BMET Recent Labs    03/10/23 0515 03/11/23 0522  NA 138 141  K 3.9 3.9  CL 103 108  CO2 25 24  GLUCOSE 124* 116*  BUN 23 23  CREATININE 1.03 0.98  CALCIUM  8.8* 8.4*    Studies/Results: No results found.  Assessment/Plan: Postop day #2: The patient is doing well.  We are awaiting the pathology.  He is okay for transfer to the floor.  LOS: 5 days     Elder Greening 03/12/2023, 8:51 AM     Patient ID: Tyler [name], male   DOB: [birth year], 73 y.o.   MRN: [id number]

## 2023-03-12 NOTE — Progress Notes (Addendum)
 Occupational Therapy Treatment Patient Details Name: Tyler [name] MRN: [id number] DOB: May 05, 1950 Today's Date: 03/12/2023   History of present illness 73 yo male presents to [facility] on 1/9 with LUE and LE weakness, headaches, fall. MRI brain showed R parietal lobe mass with surrounding vasogenic edema and 4 mm of right-to-left shift-differential includes solitary intracranial metastasis versus primary CNS neoplasm. S/p R parietal craniotomy 1/13. 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   OT comments  Patient demonstrating good gains with OT treatment with patient able to perform bed mobility and donning socks on EOB with supervision. Patient was CGA to ambulate to bathroom with 1 person HHA and performed grooming amd bathing standing at sink with CGA. Patient with one LOB in bathroom when turning from toilet with min assist for balance. Patient required frequent cues for safety. Patient with complaints of LUE weakness and provided him with level one therapy band and exercises to increase functional strength. Patient will benefit from intensive inpatient follow up therapy, >3 hours/day. Acute OT to continue to follow for safety with self care and transfers.       If plan is discharge home, recommend the following:  A little help with walking and/or transfers;A little help with bathing/dressing/bathroom;Help with stairs or ramp for entrance;Assist for transportation;Assistance with cooking/housework;Supervision due to cognitive status   Equipment Recommendations  None recommended by OT    Recommendations for Other Services      Precautions / Restrictions Precautions Precautions: Fall Precaution Comments: new crani Restrictions Weight Bearing Restrictions Per Provider Order: No       Mobility Bed Mobility Overal bed mobility: Needs Assistance Bed  Mobility: Supine to Sit     Supine to sit: Supervision     General bed mobility comments: patient attempting to lower rail himself, instructed patient to wait for assistance for safety    Transfers Overall transfer level: Needs assistance Equipment used: 1 person hand held assist Transfers: Sit to/from Stand Sit to Stand: Contact guard assist           General transfer comment: CGA for safety     Balance Overall balance assessment: Needs assistance Sitting-balance support: Feet supported Sitting balance-Leahy Scale: Good     Standing balance support: Single extremity supported, During functional activity Standing balance-Leahy Scale: Fair                             ADL either performed or assessed with clinical judgement   ADL Overall ADL's : Needs assistance/impaired     Grooming: Wash/dry hands;Wash/dry face;Oral care;Contact guard assist;Standing Grooming Details (indicate cue type and reason): at sink Upper Body Bathing: Supervision/ safety;Standing   Lower Body Bathing: Contact guard assist;Sit to/from stand Lower Body Bathing Details (indicate cue type and reason): peri area bathing Upper Body Dressing : Standing;Contact guard assist Upper Body Dressing Details (indicate cue type and reason): gown for back Lower Body Dressing: Supervision/safety;Sitting/lateral leans Lower Body Dressing Details (indicate cue type and reason): to donn socks Toilet Transfer: Contact guard assist;Ambulation;Regular Toilet             General ADL Comments: one incident of LOB during self care    Extremity/Trunk Assessment              Vision       Perception     Praxis  Cognition Arousal: Alert Behavior During Therapy: Impulsive Overall Cognitive Status: No family/caregiver present to determine baseline cognitive functioning Area of Impairment: Following commands, Safety/judgement, Awareness                       Following  Commands: Follows one step commands with increased time Safety/Judgement: Decreased awareness of safety, Decreased awareness of deficits Awareness: Emergent   General Comments: cues for safety, Tangential conversation        Exercises      Shoulder Instructions       General Comments      Pertinent Vitals/ Pain       Pain Assessment Pain Assessment: Faces Faces Pain Scale: Hurts a little bit Pain Location: Headache Pain Descriptors / Indicators: Discomfort Pain Intervention(s): Monitored during session  Home Living                                          Prior Functioning/Environment              Frequency  Min 1X/week        Progress Toward Goals  OT Goals(current goals can now be found in the care plan section)  Progress towards OT goals: Progressing toward goals  Acute Rehab OT Goals Patient Stated Goal: go home OT Goal Formulation: With patient Time For Goal Achievement: 03/24/23 Potential to Achieve Goals: Good ADL Goals Pt Will Perform Grooming: Independently Pt Will Perform Lower Body Dressing: Independently;sit to/from stand Pt Will Transfer to Toilet: Independently;regular height toilet;ambulating  Plan      Co-evaluation                 AM-PAC OT "6 Clicks" Daily Activity     Outcome Measure   Help from another person eating meals?: None Help from another person taking care of personal grooming?: A Little Help from another person toileting, which includes using toliet, bedpan, or urinal?: A Little Help from another person bathing (including washing, rinsing, drying)?: A Little Help from another person to put on and taking off regular upper body clothing?: A Little Help from another person to put on and taking off regular lower body clothing?: A Little 6 Click Score: 19    End of Session Equipment Utilized During Treatment: Gait belt  OT Visit Diagnosis: Unsteadiness on feet (R26.81);Muscle weakness  (generalized) (M62.81);Other symptoms and signs involving cognitive function   Activity Tolerance Patient tolerated treatment well   Patient Left in chair;with call bell/phone within reach;with chair alarm set   Nurse Communication Mobility status        Time: 2956-2130 OT Time Calculation (min): 30 min  Charges: OT General Charges $OT Visit: 1 Visit OT Treatments $Self Care/Home Management : 8-22 mins $Therapeutic Exercise: 8-22 mins  Anitra Barn, OTA Acute Rehabilitation Services  Office [phone]   Jovita Nipper 03/12/2023, 12:58 PM

## 2023-03-12 NOTE — Progress Notes (Signed)
 Inpatient Rehab Coordinator Note:  I met with patient at bedside to discuss CIR recommendations and goals/expectations of CIR stay.  We reviewed 3 hrs/day of therapy, physician follow up, and average length of stay 2 weeks (dependent upon progress) with goals of supervision.  Pt in agreement and asked that I update his daughter.  I called and spoke to Abagail Hoar who is also in agreement.  We reviewed insurance prior auth and I will start that today.  We will follow.   Loye Rumble, PT, DPT Admissions Coordinator [phone] 03/12/23  3:20 PM

## 2023-03-13 ENCOUNTER — Encounter (HOSPITAL_COMMUNITY): Payer: Self-pay | Admitting: Neurosurgery

## 2023-03-13 ENCOUNTER — Other Ambulatory Visit: Payer: Self-pay | Admitting: Hematology

## 2023-03-13 LAB — TYPE AND SCREEN
ABO/RH(D): O POS
Antibody Screen: NEGATIVE
Unit division: 0
Unit division: 0
Unit division: 0

## 2023-03-13 LAB — BPAM RBC
Blood Product Expiration Date: 202501212359
Blood Product Expiration Date: 202501222359
Blood Product Expiration Date: 202502032359
Unit Type and Rh: 5100
Unit Type and Rh: 5100
Unit Type and Rh: 9500

## 2023-03-13 MED ORDER — LEVETIRACETAM 500 MG PO TABS
500.0000 mg | ORAL_TABLET | Freq: Two times a day (BID) | ORAL | Status: AC
  Administered 2023-03-13 – 2023-03-16 (×8): 500 mg via ORAL
  Filled 2023-03-13 (×8): qty 1

## 2023-03-13 MED ORDER — DEXAMETHASONE 4 MG PO TABS
4.0000 mg | ORAL_TABLET | Freq: Two times a day (BID) | ORAL | Status: AC
  Administered 2023-03-13 – 2023-03-14 (×4): 4 mg via ORAL
  Filled 2023-03-13 (×4): qty 1

## 2023-03-13 MED ORDER — LEVETIRACETAM 500 MG PO TABS: 500.0000 mg | ORAL_TABLET | Freq: Two times a day (BID) | ORAL | Status: DC

## 2023-03-13 MED ORDER — ENOXAPARIN SODIUM 40 MG/0.4ML IJ SOSY
40.0000 mg | PREFILLED_SYRINGE | Freq: Every day | INTRAMUSCULAR | Status: DC
  Administered 2023-03-13 – 2023-03-17 (×5): 40 mg via SUBCUTANEOUS
  Filled 2023-03-13 (×5): qty 0.4

## 2023-03-13 NOTE — Progress Notes (Signed)
Physical Therapy Treatment Patient Details Name: Tyler [name] MRN: [id number] DOB: 12-04-1950 Today's Date: 03/13/2023   History of Present Illness 73 yo male presents to [facility] on 1/9 with LUE and LE weakness, headaches, fall. MRI brain showed R parietal lobe mass with surrounding vasogenic edema and 4 mm of right-to-left shift-differential includes solitary intracranial metastasis versus primary CNS neoplasm. S/p R parietal craniotomy 1/13. 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    PT Comments  Progressing well toward goals.  Emphasis on age appropriate mobility/activity with moderate balance challenge during gait activity including abrupt changes in direction, speed with "runway turns", best speed, stepping over expansion strips/obstacles and safe negotiation of stairs, needing to cue and guard for optimal safety.     If plan is discharge home, recommend the following: A little help with walking and/or transfers;A little help with bathing/dressing/bathroom;Assistance with cooking/housework;Direct supervision/assist for medications management;Direct supervision/assist for financial management;Assist for transportation;Help with stairs or ramp for entrance;Supervision due to cognitive status   Can travel by private vehicle        Equipment Recommendations  Other (comment)    Recommendations for Other Services       Precautions / Restrictions Precautions Precautions: Fall Precaution Comments: new crani     Mobility  Bed Mobility Overal bed mobility: Needs Assistance Bed Mobility: Supine to Sit, Sit to Supine     Supine to sit: Supervision Sit to supine: Supervision   General bed mobility comments: Not independent due to impulsivities    Transfers Overall transfer level: Needs assistance Equipment used: 1 person hand held  assist Transfers: Sit to/from Stand Sit to Stand: Supervision           General transfer comment: Supervision for safety    Ambulation/Gait Ambulation/Gait assistance: Contact guard assist, Min assist Gait Distance (Feet): 200 Feet (then other activity and another 200 feet) Assistive device: 1 person hand held assist, None Gait Pattern/deviations: Step-through pattern   Gait velocity interpretation: >4.37 ft/sec, indicative of normal walking speed   General Gait Details: mild unsteadiness with some deviation with challenge including scissor and narrow/soft stagger to recover balance.  pt is able to speed up significantly to about age appropriate levels.   Stairs Stairs: Yes Stairs assistance: Contact guard assist Stair Management: One rail Right, Alternating pattern, Forwards Number of Stairs: 12 General stair comments: initially mildly uncoordinated L LE, but smoothed out with practice.   Wheelchair Mobility     Tilt Bed    Modified Rankin (Stroke Patients Only)       Balance Overall balance assessment: Needs assistance Sitting-balance support: Feet supported Sitting balance-Leahy Scale: Good     Standing balance support: Single extremity supported, During functional activity Standing balance-Leahy Scale: Fair                   Standardized Balance Assessment Standardized Balance Assessment : Dynamic Gait Index   Dynamic Gait Index Level Surface: Mild Impairment Change in Gait Speed: Normal Gait and Pivot Turn: Mild Impairment Step Over Obstacle: Mild Impairment Steps: Mild Impairment      Cognition Arousal: Alert Behavior During Therapy: Impulsive Overall Cognitive Status: No family/caregiver present to determine baseline cognitive functioning                         Following Commands: Follows one step commands consistently, Follows one step commands with  increased time Safety/Judgement: Decreased awareness of safety, Decreased  awareness of deficits Awareness: Emergent            Exercises      General Comments General comments (skin integrity, edema, etc.): vss,  still mildly impulsive and not fully aware of his mild limitations.      Pertinent Vitals/Pain Pain Assessment Pain Assessment: Faces Faces Pain Scale: No hurt Pain Intervention(s): Monitored during session    Home Living                          Prior Function            PT Goals (current goals can now be found in the care plan section) Acute Rehab PT Goals PT Goal Formulation: With patient Time For Goal Achievement: 03/22/23 Potential to Achieve Goals: Good Progress towards PT goals: Progressing toward goals    Frequency    Min 1X/week      PT Plan      Co-evaluation              AM-PAC PT "6 Clicks" Mobility   Outcome Measure  Help needed turning from your back to your side while in a flat bed without using bedrails?: A Little Help needed moving from lying on your back to sitting on the side of a flat bed without using bedrails?: A Little Help needed moving to and from a bed to a chair (including a wheelchair)?: A Little Help needed standing up from a chair using your arms (e.g., wheelchair or bedside chair)?: A Little Help needed to walk in hospital room?: A Little Help needed climbing 3-5 steps with a railing? : A Little 6 Click Score: 18    End of Session   Activity Tolerance: Patient tolerated treatment well Patient left: in bed;with bed alarm set;with call bell/phone within reach Nurse Communication: Mobility status PT Visit Diagnosis: Unsteadiness on feet (R26.81);Other abnormalities of gait and mobility (R26.89);Other symptoms and signs involving the nervous system (R29.898)     Time: 1610-9604 PT Time Calculation (min) (ACUTE ONLY): 20 min  Charges:    $Gait Training: 8-22 mins PT General Charges $$ ACUTE PT VISIT: 1 Visit                     03/13/2023  Jacinto Halim., PT Acute  Rehabilitation Services [phone]  (office)   Eliseo Gum Eathan Groman 03/13/2023, 7:09 PM

## 2023-03-13 NOTE — Progress Notes (Signed)
Pt with no IV access. MD made aware. New orders received for PO route for medications.

## 2023-03-13 NOTE — Progress Notes (Signed)
Inpatient Rehab Admissions Coordinator:   Awaiting determination from [facility] regarding CIR prior auth request.  We will follow.   Estill Dooms, PT, DPT Admissions Coordinator [phone] 03/13/23  10:15 AM

## 2023-03-13 NOTE — Plan of Care (Signed)
  Problem: Clinical Measurements: Goal: Respiratory complications will improve Outcome: Progressing Goal: Cardiovascular complication will be avoided Outcome: Progressing   Problem: Activity: Goal: Risk for activity intolerance will decrease Outcome: Progressing   Problem: Nutrition: Goal: Adequate nutrition will be maintained Outcome: Progressing   Problem: Coping: Goal: Level of anxiety will decrease Outcome: Progressing   Problem: Elimination: Goal: Will not experience complications related to urinary retention Outcome: Progressing   Problem: Pain Management: Goal: General experience of comfort will improve Outcome: Progressing   Problem: Safety: Goal: Ability to remain free from injury will improve Outcome: Progressing

## 2023-03-13 NOTE — PMR Pre-admission (Shared)
PMR Admission Coordinator Pre-Admission Assessment  Patient: Tyler [name] is an 73 y.o., male MRN: [id number] DOB: Aug 13, 1950 Height: 5\' 7"  (170.2 cm) Weight: 87.5 kg              Insurance Information HMO: yes    PPO:      PCP:      IPA:      80/20:      OTHER:  PRIMARY: [employer]      Policy#: [id number]      Subscriber: pt CM Name: Alexia Freestone      Phone#: [phone]     Fax#: [phone] Pre-Cert#: [id number] auth for CIR from Patty with [employer] with updates due to fax listed above on ***      Employer:  Benefits:  Phone #: [phone]     Name:  Eff. Date: 02/26/23     Deduct: $7500 (met $$553.05)      Out of Pocket Max: $9200 (met $553.05)      Life Max: n/a  CIR: 50%      SNF: 50% Outpatient: 50%     Co-Pay: 50% Home Health: 50%      Co-Pay: 50% DME: 50%     Co-Pay: 50% Providers:  SECONDARY: [employer] Medicaid      Policy#: [id number]      Phone#: [phone]  Financial Counselor:       Phone#:   The "Data Collection Information Summary" for patients in Inpatient Rehabilitation Facilities with attached "Privacy Act Statement-Health Care Records" was provided and verbally reviewed with: Patient and Family  Emergency Contact Information Contact Information     Name Relation Home Work [place] Daughter [phone]  [phone]      Other Contacts     Name Relation Home Work Mobile   [place] Spouse   [phone]   Luan Pulling   [phone]      Current Medical History  Patient Admitting Diagnosis: brain tumor  History of Present Illness: Tyler [name] is a 73 y.o. male who presented to [facility] on 1/9 with LUE and LE weakness, headaches, and fall. MRI brain showed a right parietal lobe mass with surrounding vasogenic edema and 4mm of right to left shift. Differential includes solitary intracranial metastasis versus primary CNS neoplasm. He is s/p right parietal craniotomy 1/13. 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 Therapy evaluations completed and pt was recommended for CIR.    Glasgow Coma Scale Score: 15  Patient's medical record from [facility] has been reviewed by the rehabilitation admission coordinator and physician.  Past Medical History  Past Medical History:  Diagnosis Date   Cancer [facility])    prostate   Follicular lymphoma (HCC) 2024   History of pulmonary embolism    Hypertension    Myelodysplastic syndrome (HCC)    PAF (paroxysmal atrial fibrillation) (HCC)    Recurrent UTI     Has the patient had major surgery during 100 days prior to admission? Yes  Family History  family history includes Stroke in his father.   Current Medications   Current Facility-Administered Medications:    0.9 %  sodium chloride infusion (Manually program via Guardrails IV Fluids), , Intravenous, Once, Tressie Stalker, MD   0.9 %  sodium chloride infusion, 10 mL/hr, Intravenous, Once, Tressie Stalker, MD   acetaminophen (TYLENOL) tablet 650 mg, 650 mg, Oral, Q6H PRN, 650 mg at 03/08/23 2145 **OR**  acetaminophen (TYLENOL) suppository 650 mg, 650 mg, Rectal, Q6H PRN, Tressie Stalker, MD   Chlorhexidine Gluconate Cloth 2 % PADS 6 each, 6 each, Topical, Daily, Tressie Stalker, MD, 6 each at 03/13/23 3244   dexamethasone (DECADRON) tablet 4 mg, 4 mg, Oral, Q12H, Bergman, Meghan D, NP, 4 mg at 03/13/23 1042   docusate sodium (COLACE) capsule 100 mg, 100 mg, Oral, BID, Tressie Stalker, MD, 100 mg at 03/13/23 0927   enoxaparin (LOVENOX) injection 40 mg, 40 mg, Subcutaneous, Daily, Bergman, Meghan D, NP, 40 mg at 03/13/23 1234   hydrALAZINE (APRESOLINE) injection 10 mg, 10 mg, Intravenous, Q6H PRN, Janeann Forehand D, NP, 10 mg at 03/11/23 1042   HYDROcodone-acetaminophen (NORCO/VICODIN) 5-325 MG per tablet 1-2 tablet, 1-2 tablet, Oral, Q4H PRN, Tressie Stalker, MD, 1 tablet at 03/07/23 1849   labetalol  (NORMODYNE) injection 10 mg, 10 mg, Intravenous, Q6H PRN, Janeann Forehand D, NP, 10 mg at 03/11/23 0838   levETIRAcetam (KEPPRA) tablet 500 mg, 500 mg, Oral, BID, Bergman, Meghan D, NP, 500 mg at 03/13/23 1042   methocarbamol (ROBAXIN) tablet 750 mg, 750 mg, Oral, Q8H PRN, Tressie Stalker, MD, 750 mg at 03/07/23 1317   metoprolol succinate (TOPROL-XL) 24 hr tablet 25 mg, 25 mg, Oral, Daily, Harris, Whitney D, NP, 25 mg at 03/13/23 1234   morphine (PF) 2 MG/ML injection 2-4 mg, 2-4 mg, Intravenous, Q2H PRN, Tressie Stalker, MD, 2 mg at 03/11/23 0747   ondansetron (ZOFRAN) tablet 4 mg, 4 mg, Oral, Q6H PRN **OR** ondansetron (ZOFRAN) injection 4 mg, 4 mg, Intravenous, Q6H PRN, Tressie Stalker, MD   pantoprazole (PROTONIX) EC tablet 40 mg, 40 mg, Oral, BID, Tressie Stalker, MD, 40 mg at 03/13/23 0102   promethazine (PHENERGAN) tablet 12.5-25 mg, 12.5-25 mg, Oral, Q4H PRN, Tressie Stalker, MD   tenofovir alafenamide (VEMLIDY) tablet 25 mg, 25 mg, Oral, Daily, Tressie Stalker, MD, 25 mg at 03/13/23 7253  Patients Current Diet:  Diet Order             Diet regular Fluid consistency: Thin  Diet effective now                   Precautions / Restrictions Precautions Precautions: Fall Precaution Comments: new crani Restrictions Weight Bearing Restrictions Per Provider Order: No   Has the patient had 2 or more falls or a fall with injury in the past year?Yes  Prior Activity Level Limited Community (1-2x/wk): indep prior to admit, occasional RW and occasional assist from spouse  Prior Functional Level Prior Function Prior Level of Function : Independent/Modified Independent Mobility Comments: occasional RW use ADLs Comments: Independent to Mod I with occasional assistance from spouse.  Self Care: Did the patient need help bathing, dressing, using the toilet or eating?  Needed some help  Indoor Mobility: Did the patient need assistance with walking from room to room (with or  without device)? Independent  Stairs: Did the patient need assistance with internal or external stairs (with or without device)? Independent  Functional Cognition: Did the patient need help planning regular tasks such as shopping or remembering to take medications? Needed some help  Patient Information Are you of Hispanic, Latino/a,or Spanish origin?: A. No, not of Hispanic, Latino/a, or Spanish origin What is your race?: B. Black or African American Do you need or want an interpreter to communicate with a doctor or health care staff?: 0. No (pt has declined interpreter)  Patient's Response To:  Health Literacy and Transportation Is the patient able to respond  to health literacy and transportation needs?: Yes Health Literacy - How often do you need to have someone help you when you read instructions, pamphlets, or other written material from your doctor or pharmacy?: Often In the past 12 months, has lack of transportation kept you from medical appointments or from getting medications?: No In the past 12 months, has lack of transportation kept you from meetings, work, or from getting things needed for daily living?: No  Home Assistive Devices / Equipment Home Equipment: Agricultural consultant (2 wheels), BSC/3in1  Prior Device Use: Indicate devices/aids used by the patient prior to current illness, exacerbation or injury? Walker  Current Functional Level Cognition  Overall Cognitive Status: No family/caregiver present to determine baseline cognitive functioning Orientation Level: Oriented X4 Following Commands: Follows one step commands consistently, Follows one step commands with increased time Safety/Judgement: Decreased awareness of safety, Decreased awareness of deficits General Comments: cues for safety, Tangential conversation    Extremity Assessment (includes Sensation/Coordination)  Upper Extremity Assessment: Defer to OT evaluation LUE Deficits / Details: min incoordination noted  LUE functionally LUE Sensation: WNL LUE Coordination: decreased fine motor, decreased gross motor  Lower Extremity Assessment: Generalized weakness, LLE deficits/detail LLE Deficits / Details: at least 3+/5 throughout    ADLs  Overall ADL's : Needs assistance/impaired Eating/Feeding: Set up, Sitting Grooming: Wash/dry hands, Wash/dry face, Oral care, Contact guard assist, Standing Grooming Details (indicate cue type and reason): at sink Upper Body Bathing: Supervision/ safety, Standing Lower Body Bathing: Contact guard assist, Sit to/from stand Lower Body Bathing Details (indicate cue type and reason): peri area bathing Upper Body Dressing : Standing, Contact guard assist Upper Body Dressing Details (indicate cue type and reason): gown for back Lower Body Dressing: Supervision/safety, Sitting/lateral leans Lower Body Dressing Details (indicate cue type and reason): to donn socks Toilet Transfer: Contact guard assist, Ambulation, Regular Toilet General ADL Comments: one incident of LOB during self care    Mobility  Overal bed mobility: Needs Assistance Bed Mobility: Supine to Sit Supine to sit: Supervision Sit to supine: Supervision General bed mobility comments: Not independent due to impulsivities    Transfers  Overall transfer level: Needs assistance Equipment used: 1 person hand held assist Transfers: Sit to/from Stand Sit to Stand: Contact guard assist General transfer comment: CGA for safety    Ambulation / Gait / Stairs / Wheelchair Mobility  Ambulation/Gait Ambulation/Gait assistance: Min assist, Contact guard assist Gait Distance (Feet): 150 Feet (x2 with  change of activity in between) Assistive device: 1 person hand held assist Gait Pattern/deviations: Step-through pattern General Gait Details: mild unsteadiness with notable improvement over the gait trials.  Noted mildly halted swing through with less heel toe, but L LE affecting balance less with time in activity.   Also scanning produced mild instability initially and less as he scanned more. Gait velocity: decr Gait velocity interpretation: 1.31 - 2.62 ft/sec, indicative of limited community ambulator Stairs: Yes Stairs assistance: Min assist Stair Management: One rail Right, Alternating pattern, Forwards Number of Stairs: 4 (x2) General stair comments: uncoordinate/stacatto step up with L LE, more controlled down.  Safe with the rail    Posture / Balance Balance Overall balance assessment: Needs assistance Sitting-balance support: Feet supported Sitting balance-Leahy Scale: Good Standing balance support: Single extremity supported, During functional activity Standing balance-Leahy Scale: Fair    Special needs/care consideration Skin surgical incision to head     Previous Home Environment (from acute therapy documentation) Living Arrangements: Spouse/significant other Available Help at Discharge: Family Type of  Home: Apartment Home Layout: One level Home Access: Level entry Entrance Stairs-Rails: Right, Left Entrance Stairs-Number of Steps: flight Bathroom Shower/Tub: Engineer, manufacturing systems: Standard Bathroom Accessibility: Yes  Discharge Living Setting Plans for Discharge Living Setting: Patient's home, Lives with (comment) (spouse) Type of Home at Discharge: House Discharge Home Layout: Two level Alternate Level Stairs-Rails: Left, Right Alternate Level Stairs-Number of Steps: flight Discharge Home Access: Level entry Discharge Bathroom Shower/Tub: Tub/shower unit Discharge Bathroom Toilet: Standard Discharge Bathroom Accessibility: Yes How Accessible: Accessible via walker  Social/Family/Support Systems Patient Roles: Spouse Anticipated Caregiver: spouse will be primary caregiver, primary contact per pt is daughter Karena Addison Anticipated Caregiver's Contact Information: Sherron Flemings  [phone] Ability/Limitations of Caregiver: none stated Caregiver Availability:  24/7 Discharge Plan Discussed with Primary Caregiver: Yes Is Caregiver In Agreement with Plan?: Yes Does Caregiver/Family have Issues with Lodging/Transportation while Pt is in Rehab?: No   Goals Patient/Family Goal for Rehab: PT/OT/SLP supervision Expected length of stay: 6-9 days Cultural Considerations: Pt's primary language is Swahili, but he has declined the need for an interpreter Additional Information: Discharge plan: home with spouse 24/7, has 2 adult children able to provide some intermittent assist if needed, Karena Addison is primary contact per pt request Pt/Family Agrees to Admission and willing to participate: Yes Program Orientation Provided & Reviewed with Pt/Caregiver Including Roles  & Responsibilities: Yes   Decrease burden of Care through IP rehab admission: n/a   Possible need for SNF placement upon discharge:not anticipated.  Plan to return to pt's home with spouse providign 24/7 assist.    Patient Condition: This patient's condition remains as documented in the consult dated 03/13/23, in which the Rehabilitation Physician determined and documented that the patient's condition is appropriate for intensive rehabilitative care in an inpatient rehabilitation facility. Will admit to inpatient rehab ***.  Preadmission Screen Completed By:  Stephania Fragmin, PT, 03/13/2023 1:46 PM ______________________________________________________________________   Discussed status with Dr. [name] Kitchenon***at *** and received approval for admission today.  Admission Coordinator:  Stephania Fragmin, time***/Date***

## 2023-03-13 NOTE — Progress Notes (Signed)
1610-RU was notified by tech that patients IV infiltrated shortly after hanging IV Keppra. Rn stopped infusion, removed IV, elevated arm and placed warm compress to extremity. Approximately 25mL was used.   This RN called pharmacy to notified of changes. Rescheduled keppra for 0800.

## 2023-03-13 NOTE — Progress Notes (Signed)
Subjective: The patient is alert and pleasant.  He has no complaints.  Objective: Vital signs in last 24 hours: Temp:  [97.6 F (36.4 C)-99.1 F (37.3 C)] 98.7 F (37.1 C) (01/16 0300) Pulse Rate:  [54-93] 54 (01/16 0300) Resp:  [14-18] 16 (01/16 0300) BP: (129-156)/(77-98) 129/77 (01/16 0300) SpO2:  [91 %-96 %] 91 % (01/16 0300) Estimated body mass index is 30.23 kg/m as calculated from the following:   Height as of this encounter: 5\' 7"  (1.702 m).   Weight as of this encounter: 87.5 kg.   Intake/Output from previous day: 01/15 0701 - 01/16 0700 In: 120 [P.O.:120] Out: 1280 [Urine:1280] Intake/Output this shift: No intake/output data recorded.  Physical exam the patient is alert and oriented.  His strength is normal.  His craniotomy wound is healing well.  Lab Results: Recent Labs    03/11/23 0522  WBC 8.5  HGB 14.3  HCT 41.6  PLT 160   BMET Recent Labs    03/11/23 0522  NA 141  K 3.9  CL 108  CO2 24  GLUCOSE 116*  BUN 23  CREATININE 0.98  CALCIUM 8.4*    Studies/Results: No results found.  Assessment/Plan: Postop day #3: The patient is doing well.  We are awaiting pathology and rehab placement.  LOS: 6 days     Tyler [name] 03/13/2023, 7:44 AM     Patient ID: Tyler [name], male   DOB: 05/11/1950, 73 y.o.   MRN: [id number]

## 2023-03-14 NOTE — Progress Notes (Addendum)
Physical Therapy Treatment Patient Details Name: Tyler [name] MRN: [id number] DOB: [birth year] Today's Date: 03/14/2023   History of Present Illness 73 yo male presents to [facility] on 1/9 with LUE and LE weakness, headaches, fall. MRI brain showed R parietal lobe mass with surrounding vasogenic edema and 4 mm of right-to-left shift-differential includes solitary intracranial metastasis versus primary CNS neoplasm. S/p R parietal craniotomy 1/13. 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    PT Comments  Pt is making great progress toward goals.  Not yet at baseline, but improving steadily.  Emphasis on age appropriate gait and balance activity with further stress on endurance.    If plan is discharge home, recommend the following: A little help with walking and/or transfers;A little help with bathing/dressing/bathroom;Assistance with cooking/housework;Direct supervision/assist for medications management;Direct supervision/assist for financial management;Assist for transportation;Help with stairs or ramp for entrance;Supervision due to cognitive status   Can travel by private vehicle        Equipment Recommendations  Other (comment)    Recommendations for Other Services       Precautions / Restrictions Precautions Precautions: Fall Precaution Comments: new crani     Mobility  Bed Mobility Overal bed mobility: Needs Assistance Bed Mobility: Supine to Sit, Sit to Supine     Supine to sit: Supervision Sit to supine: Supervision   General bed mobility comments: Not independent due to impulsivities    Transfers Overall transfer level: Needs assistance Equipment used: 1 person hand held assist Transfers: Sit to/from Stand Sit to Stand: Supervision           General transfer comment: Supervision for safety     Ambulation/Gait Ambulation/Gait assistance: Supervision Gait Distance (Feet): 1000 Feet Assistive device: 1 person hand held assist, None Gait Pattern/deviations: Step-through pattern   Gait velocity interpretation: >4.37 ft/sec, indicative of normal walking speed   General Gait Details: episodes of mild unsteadiness, but overall steady without deviation.  Pt able to attain age appropriate gait speeds, stay stable with abrupt turns and back ups   Stairs Stairs: Yes Stairs assistance: Contact guard assist Stair Management: One rail Right, Alternating pattern, Forwards Number of Stairs: 20 General stair comments: episode of instability.  Pt reports he is used to covering 2 steps at a time   Armed forces technical officer Bed    Modified Rankin (Stroke Patients Only)       Balance Overall balance assessment: Needs assistance Sitting-balance support: Feet supported Sitting balance-Leahy Scale: Good     Standing balance support: Single extremity supported, During functional activity Standing balance-Leahy Scale: Fair                   Standardized Balance Assessment Standardized Balance Assessment : Dynamic Gait Index   Dynamic Gait Index Level Surface: Normal Change in Gait Speed: Normal Gait with Horizontal Head Turns: Normal Gait and Pivot Turn: Mild Impairment Step Over Obstacle: Mild Impairment Steps: Mild Impairment      Cognition Arousal: Alert Behavior During Therapy: Impulsive Overall Cognitive Status: No family/caregiver present to determine baseline cognitive functioning                                          Exercises      General Comments  Pertinent Vitals/Pain Pain Assessment Pain Assessment: Faces Faces Pain Scale: No hurt    Home Living                          Prior Function            PT Goals (current goals can now be found in the care plan section) Acute Rehab PT Goals Patient  Stated Goal: none stated PT Goal Formulation: With patient Time For Goal Achievement: 03/22/23 Potential to Achieve Goals: Good Progress towards PT goals: Progressing toward goals    Frequency    Min 1X/week      PT Plan      Co-evaluation              AM-PAC PT "6 Clicks" Mobility   Outcome Measure  Help needed turning from your back to your side while in a flat bed without using bedrails?: A Little Help needed moving from lying on your back to sitting on the side of a flat bed without using bedrails?: A Little Help needed moving to and from a bed to a chair (including a wheelchair)?: A Little Help needed standing up from a chair using your arms (e.g., wheelchair or bedside chair)?: A Little Help needed to walk in hospital room?: A Little Help needed climbing 3-5 steps with a railing? : A Little 6 Click Score: 18    End of Session   Activity Tolerance: Patient tolerated treatment well Patient left: in bed;with bed alarm set;with call bell/phone within reach Nurse Communication: Mobility status PT Visit Diagnosis: Unsteadiness on feet (R26.81);Other abnormalities of gait and mobility (R26.89);Other symptoms and signs involving the nervous system (R29.898)     Time: 2536-6440 PT Time Calculation (min) (ACUTE ONLY): 30 min  Charges:    $Gait Training: 8-22 mins $Therapeutic Activity: 8-22 mins PT General Charges $$ ACUTE PT VISIT: 1 Visit                     03/14/2023  Jacinto Halim., PT Acute Rehabilitation Services [phone]  (office)   Eliseo Gum Damauri Minion 03/14/2023, 6:49 PM

## 2023-03-14 NOTE — Progress Notes (Signed)
Subjective: The patient is alert and pleasant.  He has no complaints.  Objective: Vital signs in last 24 hours: Temp:  [97.7 F (36.5 C)-98.6 F (37 C)] 98.6 F (37 C) (01/17 0726) Pulse Rate:  [50-85] 57 (01/17 0726) Resp:  [14-18] 18 (01/17 0726) BP: (124-150)/(73-104) 146/94 (01/17 0726) SpO2:  [90 %-98 %] 92 % (01/17 0726) Estimated body mass index is 30.23 kg/m as calculated from the following:   Height as of this encounter: 5\' 7"  (1.702 m).   Weight as of this encounter: 87.5 kg.   Intake/Output from previous day: 01/16 0701 - 01/17 0700 In: 240 [P.O.:240] Out: 1050 [Urine:1050] Intake/Output this shift: No intake/output data recorded.  Physical exam the patient is alert and oriented.  He is moving all 4 extremities well.  His speech is normal.  Lab Results: No results for input(s): "WBC", "HGB", "HCT", "PLT" in the last 72 hours. BMET No results for input(s): "NA", "K", "CL", "CO2", "GLUCOSE", "BUN", "CREATININE", "CALCIUM" in the last 72 hours.  Studies/Results: No results found.  Assessment/Plan: Postop day #4 the patient is doing well.  We are awaiting the pathology results, further oncology recommendations, and rehab placement.  LOS: 7 days     [name] 03/14/2023, 7:43 AM     Patient ID: Tyler [name], male   DOB: 09-25-1950, 73 y.o.   MRN: [id number]

## 2023-03-15 NOTE — Plan of Care (Signed)
  Problem: Education: Goal: Knowledge of General Education information will improve Description: Including pain rating scale, medication(s)/side effects and non-pharmacologic comfort measures Outcome: Progressing   Problem: Health Behavior/Discharge Planning: Goal: Ability to manage health-related needs will improve Outcome: Progressing   Problem: Clinical Measurements: Goal: Ability to maintain clinical measurements within normal limits will improve Outcome: Progressing Goal: Will remain free from infection Outcome: Progressing Goal: Diagnostic test results will improve Outcome: Progressing Goal: Respiratory complications will improve Outcome: Progressing Goal: Cardiovascular complication will be avoided Outcome: Progressing   Problem: Activity: Goal: Risk for activity intolerance will decrease Outcome: Progressing   Problem: Nutrition: Goal: Adequate nutrition will be maintained Outcome: Progressing   Problem: Coping: Goal: Level of anxiety will decrease Outcome: Progressing   Problem: Elimination: Goal: Will not experience complications related to bowel motility Outcome: Progressing Goal: Will not experience complications related to urinary retention Outcome: Progressing   Problem: Pain Management: Goal: General experience of comfort will improve Outcome: Progressing   Problem: Safety: Goal: Ability to remain free from injury will improve Outcome: Progressing   Problem: Skin Integrity: Goal: Risk for impaired skin integrity will decrease Outcome: Progressing   Problem: Education: Goal: Knowledge of the prescribed therapeutic regimen will improve Outcome: Progressing   Problem: Clinical Measurements: Goal: Usual level of consciousness will be regained or maintained. Outcome: Progressing Goal: Neurologic status will improve Outcome: Progressing Goal: Ability to maintain intracranial pressure will improve Outcome: Progressing   Problem: Skin  Integrity: Goal: Demonstration of wound healing without infection will improve Outcome: Progressing

## 2023-03-15 NOTE — Progress Notes (Signed)
Patient looks good today.  He is awake and alert and eating breakfast.  Incision is clean dry and intact.  He is postop day 5 after craniotomy.  Awaiting pathology.  Awaiting rehab.

## 2023-03-16 NOTE — Progress Notes (Signed)
No change neurologically.  Incision is clean dry and intact.  He is moving all extremities.  He is complaining of right thigh pain that started about 4 AM.  No numbness or tingling or weakness.  We will keep an eye on this for now.  May have been positional.  Continue to mobilize.  Awaiting rehab.

## 2023-03-17 ENCOUNTER — Other Ambulatory Visit: Payer: Self-pay | Admitting: Hematology

## 2023-03-17 LAB — SURGICAL PATHOLOGY

## 2023-03-17 MED ORDER — DOCUSATE SODIUM 100 MG PO CAPS: 100.0000 mg | ORAL_CAPSULE | Freq: Two times a day (BID) | ORAL | 0 refills | Status: DC

## 2023-03-17 MED ORDER — HYDROCODONE-ACETAMINOPHEN 5-325 MG PO TABS: 1.0000 | ORAL_TABLET | Freq: Four times a day (QID) | ORAL | 0 refills | Status: DC | PRN

## 2023-03-17 NOTE — Progress Notes (Signed)
Physical Therapy Treatment Patient Details Name: Tyler [name] MRN: [id number] DOB: 08-17-1950 Today's Date: 03/17/2023   History of Present Illness 73 yo male presents to [facility] on 1/9 with LUE and LE weakness, headaches, fall. MRI brain showed R parietal lobe mass with surrounding vasogenic edema and 4 mm of right-to-left shift-differential includes solitary intracranial metastasis versus primary CNS neoplasm. S/p R parietal craniotomy 1/13. 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    PT Comments  Pt tolerates treatment well, tolerating multiple dynamic gait and balance challenges without loss of balance. Pt also negotiates a flight of stairs with great stability this session. Pt reports confidence in his mobility at this time and appears to no longer require inpatient PT follow-up. PT recommends outpatient PT services at the time of discharge.    If plan is discharge home, recommend the following: A little help with bathing/dressing/bathroom;Assistance with cooking/housework;Direct supervision/assist for medications management;Direct supervision/assist for financial management;Assist for transportation;Help with stairs or ramp for entrance   Can travel by private vehicle        Equipment Recommendations  None recommended by PT    Recommendations for Other Services       Precautions / Restrictions Precautions Precautions: Fall Precaution Comments: new crani Restrictions Weight Bearing Restrictions Per Provider Order: No     Mobility  Bed Mobility Overal bed mobility: Modified Independent                  Transfers Overall transfer level: Independent                      Ambulation/Gait Ambulation/Gait assistance: Supervision Gait Distance (Feet): 600 Feet Assistive device: None Gait Pattern/deviations: Step-through  pattern Gait velocity: functional Gait velocity interpretation: >2.62 ft/sec, indicative of community ambulatory   General Gait Details: pt tolerates dynamic head turns, changes in stride length, quick changes in direction and backward walking all without loss of balance   Stairs Stairs: Yes Stairs assistance: Supervision Stair Management: One rail Right, Alternating pattern, Forwards Number of Stairs: 10     Wheelchair Mobility     Tilt Bed    Modified Rankin (Stroke Patients Only)       Balance Overall balance assessment: Needs assistance Sitting-balance support: No upper extremity supported, Feet supported Sitting balance-Leahy Scale: Good     Standing balance support: During functional activity, No upper extremity supported Standing balance-Leahy Scale: Good Standing balance comment: pt holds narrowed BOS with eyes closed for 30 seconds, picks up a pen from the floor with good stability.                            Cognition Arousal: Alert Behavior During Therapy: WFL for tasks assessed/performed Overall Cognitive Status: No family/caregiver present to determine baseline cognitive functioning                                 General Comments: pt follows commands well during session, appears appropriate for this session, does not recall his room number, although he reports recently transferring to this room overnight        Exercises      General Comments General comments (skin integrity, edema, etc.): VSS on RA      Pertinent Vitals/Pain Pain Assessment Pain Assessment: No/denies pain  Home Living                          Prior Function            PT Goals (current goals can now be found in the care plan section) Acute Rehab PT Goals Patient Stated Goal: none stated Additional Goals Additional Goal #1: Pt will score >19/24 on the DGI to indicate a reduced risk for falls Additional Goal #2: Pt will score >45/56  on the BERG to indicate a reduced risk for falls Progress towards PT goals: Progressing toward goals    Frequency    Min 1X/week      PT Plan      Co-evaluation              AM-PAC PT "6 Clicks" Mobility   Outcome Measure  Help needed turning from your back to your side while in a flat bed without using bedrails?: None Help needed moving from lying on your back to sitting on the side of a flat bed without using bedrails?: None Help needed moving to and from a bed to a chair (including a wheelchair)?: None Help needed standing up from a chair using your arms (e.g., wheelchair or bedside chair)?: None Help needed to walk in hospital room?: A Little Help needed climbing 3-5 steps with a railing? : A Little 6 Click Score: 22    End of Session Equipment Utilized During Treatment:  (pt refuses gait belt) Activity Tolerance: Patient tolerated treatment well Patient left: in bed;with call bell/phone within reach;with bed alarm set Nurse Communication: Mobility status PT Visit Diagnosis: Unsteadiness on feet (R26.81);Other abnormalities of gait and mobility (R26.89);Other symptoms and signs involving the nervous system (R29.898)     Time: 8295-6213 PT Time Calculation (min) (ACUTE ONLY): 14 min  Charges:    $Gait Training: 8-22 mins PT General Charges $$ ACUTE PT VISIT: 1 Visit                     Arlyss Gandy, PT, DPT Acute Rehabilitation Office [phone]    Arlyss Gandy 03/17/2023, 12:50 PM

## 2023-03-17 NOTE — Progress Notes (Signed)
Subjective: The patient is alert and pleasant.  He has no complaints.  Objective: Vital signs in last 24 hours: Temp:  [97.7 F (36.5 C)-98.6 F (37 C)] 97.8 F (36.6 C) (01/20 0511) Pulse Rate:  [56-70] 58 (01/20 0511) Resp:  [16-18] 18 (01/20 0511) BP: (125-151)/(81-94) 131/87 (01/20 0511) SpO2:  [92 %-100 %] 99 % (01/20 0511) Estimated body mass index is 30.23 kg/m as calculated from the following:   Height as of this encounter: 5\' 7"  (1.702 m).   Weight as of this encounter: 87.5 kg.   Intake/Output from previous day: 01/19 0701 - 01/20 0700 In: -  Out: 750 [Urine:750] Intake/Output this shift: No intake/output data recorded.  Physical exam the patient is alert and oriented.  His strength is normal.  His craniotomy incision is healing well.  Lab Results: No results for input(s): "WBC", "HGB", "HCT", "PLT" in the last 72 hours. BMET No results for input(s): "NA", "K", "CL", "CO2", "GLUCOSE", "BUN", "CREATININE", "CALCIUM" in the last 72 hours.  Studies/Results: No results found.  Assessment/Plan: Postop day #7: The patient is doing well.  We are awaiting rehab and pathology.  We will remove his scalp staples.  LOS: 10 days     Tyler [name] 03/17/2023, 7:52 AM     Patient ID: Tyler [name], male   DOB: [birth year], 73 y.o.   MRN: [id number]

## 2023-03-17 NOTE — Progress Notes (Signed)
Inpatient Rehab Admissions Coordinator:   Discussed with Dr. Wynn Banker for rehab candidacy.  Pt most recent therapy documentation pt ambulating 1000' with HHA/vs no DME supervision.  Supervision/CGA for ADLs as well.  Pt doing too well to admit to CIR at this point and can likely d/c directly home.  I notified therapy who is going to see pt this AM.    Estill Dooms, PT, DPT Admissions Coordinator [phone] 03/17/23  10:21 AM

## 2023-03-17 NOTE — Progress Notes (Signed)
   Providing Compassionate, Quality Care - Together   Subjective: Patient reports no new issues.  Objective: Vital signs in last 24 hours: Temp:  [97.7 F (36.5 C)-98.6 F (37 C)] 98 F (36.7 C) (01/20 0806) Pulse Rate:  [58-63] 59 (01/20 0806) Resp:  [16-18] 18 (01/20 0806) BP: (125-151)/(81-94) 134/84 (01/20 0806) SpO2:  [92 %-100 %] 100 % (01/20 0806)  Intake/Output from previous day: 01/19 0701 - 01/20 0700 In: -  Out: 750 [Urine:750] Intake/Output this shift: No intake/output data recorded.  Alert and oriented x 4 PERRLA CN II-XII grossly intact MAE, Strength and sensation intact Incision is clean, dry, and intact   Lab Results: No results for input(s): "WBC", "HGB", "HCT", "PLT" in the last 72 hours. BMET No results for input(s): "NA", "K", "CL", "CO2", "GLUCOSE", "BUN", "CREATININE", "CALCIUM" in the last 72 hours.  Studies/Results: No results found.  Assessment/Plan: Patient underwent craniotomy for resection of brain tumor by Dr. Lovell Sheehan on 03/09/2022. He has done well since surgery and progressed to the point he can discharge home. Pathology came back as B cell lymphoma. Patient will need to follow up with his oncologist.   LOS: 10 days   -Patient will discharge home tomorrow.   Val Eagle, DNP, AGNP-C Nurse Practitioner  [facility] [address], [address], [place], [place] [address] P: [phone]    F: [phone]  03/17/2023, 1:36 PM

## 2023-03-17 NOTE — Discharge Summary (Signed)
Physician Discharge Summary     Providing Compassionate, Quality Care - Together   Patient ID: Tyler [name] MRN: [id number] DOB/AGE: 1950/11/11 73 y.o.  Admit date: 03/06/2023 Discharge date: 03/17/2023  Admission Diagnoses: Brain tumor with vasogenic edema  Discharge Diagnoses:  Principal Problem:   Brain tumor with vasogenic edema (HCC) Active Problems:   Paroxysmal atrial fibrillation (HCC)   Diffuse large B-cell lymphoma of lymph nodes of multiple regions (HCC)   Vasogenic edema (HCC)   Peptic ulcer disease   History of postprocedural pulmonary embolism   Prostate cancer with hx of prostatectomy   MDS (myelodysplastic syndrome) (HCC)   Chronic hepatitis B (HCC)   Sinus bradycardia   Left hemiparesis (HCC)   Discharged Condition: good  Hospital Course: Patient underwent a right parietal craniotomy for resection of brain tumor by Dr. Lovell Sheehan on 03/10/2023. He was admitted to the ICU following recovery from anesthesia in the PACU. He progressed and was moved out of the ICU. His postoperative course has been uncomplicated. He has worked with both physical and occupational therapies who feel the patient is ready for discharge home. He is ambulating independently and without difficulty. He is tolerating a normal diet. He is not having any bowel or bladder dysfunction. His pain is well-controlled with oral pain medication. He is ready for discharge home.   Consults: PT/OT/TOC  Significant Diagnostic Studies: radiology: CT ABDOMEN PELVIS W CONTRAST Result Date: 03/07/2023 CLINICAL DATA:  Metastatic disease evaluation. History of diffuse large B-cell lymphoma. * Tracking Code: BO * EXAM: CT ABDOMEN AND PELVIS WITH CONTRAST TECHNIQUE: Multidetector CT imaging of the abdomen and pelvis was performed using the standard protocol following bolus administration of intravenous contrast. RADIATION DOSE REDUCTION: This exam was performed according to the departmental dose-optimization program  which includes automated exposure control, adjustment of the mA and/or kV according to patient size and/or use of iterative reconstruction technique. CONTRAST:  75mL OMNIPAQUE IOHEXOL 350 MG/ML SOLN COMPARISON:  CT scan nuclear medicine PET-CT scan from 12/27/2022. FINDINGS: Lower chest: There are patchy atelectatic changes in the visualized lung bases. No overt consolidation. No pleural effusion. The heart is normal in size. No pericardial effusion. Hepatobiliary: The liver is normal in size. Non-cirrhotic configuration. No suspicious mass. There are at least 2, subcentimeter hypoattenuating foci in the liver, which are too small to adequately characterize. No intrahepatic or extrahepatic bile duct dilation. No calcified gallstones. Normal gallbladder wall thickness. No pericholecystic inflammatory changes. Pancreas: Small/atrophic pancreas. No peripancreatic fat stranding. No discrete mass seen. Main pancreatic duct is not dilated. Spleen: Within normal limits. No focal lesion. Adrenals/Urinary Tract: Adrenal glands are unremarkable. No suspicious renal mass. There are multiple bilateral simple renal cysts with largest arising from the right kidney lower pole, laterally measuring up to 2.8 x 3.3 cm. No nephroureterolithiasis or obstructive uropathy. Unremarkable urinary bladder. Stomach/Bowel: No disproportionate dilation of the small or large bowel loops. No evidence of abnormal bowel wall thickening or inflammatory changes. The appendix is unremarkable. There are scattered diverticula mainly in the sigmoid colon, without imaging signs of diverticulitis. Vascular/Lymphatic: No ascites or pneumoperitoneum. No abdominal or pelvic lymphadenopathy, by size criteria. No aneurysmal dilation of the major abdominal arteries. There are mild peripheral atherosclerotic vascular calcifications of the aorta and its major branches. Reproductive: Patient is status post surgical removal of prostate and bilateral seminal  vesicles. Other: There are postsurgical changes along the midline inferior anterior abdominal wall. There are bilateral small fat containing inguinal hernias. Musculoskeletal: No suspicious osseous lesions. There are mild  multilevel degenerative changes in the visualized spine. Redemonstration of mild loss of height of L4 vertebral body. IMPRESSION: 1. Essentially stable exam when compared to recent PET-CT scan from 12/27/2022. No new lymphadenopathy by size criteria. 2. Multiple other nonacute observations, as described above. Electronically Signed   By: Jules Schick M.D.   On: 03/07/2023 16:36   MR Brain W and Wo Contrast Result Date: 03/07/2023 CLINICAL DATA:  Initial evaluation for brain/CNS neoplasm. EXAM: MRI HEAD WITHOUT AND WITH CONTRAST TECHNIQUE: Multiplanar, multiecho pulse sequences of the brain and surrounding structures were obtained without and with intravenous contrast. CONTRAST:  8mL GADAVIST GADOBUTROL 1 MMOL/ML IV SOLN COMPARISON:  CT from earlier the same day as well as previous MRI from 07/30/2022. FINDINGS: Brain: Age-related cerebral atrophy. Patchy T2/FLAIR hyperintensity involving the periventricular and deep white matter both cerebral hemispheres, most likely related to chronic microvascular ischemic disease. No acute or subacute infarct. Heterogeneous enhancing mass positioned at the right parietal lobe measures 4.1 x 3.1 x 3.1 cm (series 17, image 45). Associated susceptibility artifact consistent with necrosis and/or blood products. Surrounding T2/FLAIR signal intensity throughout the adjacent right cerebral hemisphere, consistent with vasogenic edema. Effacement of the posterior right lateral ventricle. Asymmetric dilatation of the right temporal horn consistent with a degree of ventricular trapping. No visible transependymal flow of CSF. Associated 4 mm of right-to-left shift. No other mass lesion or abnormal enhancement. No other acute or chronic intracranial blood products.  Pituitary gland and suprasellar region within normal limits. Vascular: Major intracranial vascular flow voids are maintained. Skull and upper cervical spine: Craniocervical junction within normal limits. Bone marrow signal intensity normal. No scalp soft tissue abnormality. Sinuses/Orbits: Globes and orbital soft tissues within normal limits. Mild mucosal thickening present about the ethmoidal air cells and maxillary sinuses. No significant mastoid effusion. Other: None. IMPRESSION: 1. 4.1 x 3.1 x 3.1 cm heterogeneous enhancing mass positioned at the right parietal lobe with surrounding vasogenic edema and 4 mm of right-to-left shift. Differential considerations include a solitary intracranial metastasis versus primary CNS neoplasm. 2. Underlying age-related cerebral atrophy with chronic small vessel ischemic disease. Electronically Signed   By: Rise Mu M.D.   On: 03/07/2023 04:19   CT Head Wo Contrast Result Date: 03/06/2023 CLINICAL DATA:  Blunt polytrauma, weakness EXAM: CT HEAD WITHOUT CONTRAST CT CERVICAL SPINE WITHOUT CONTRAST TECHNIQUE: Multidetector CT imaging of the head and cervical spine was performed following the standard protocol without intravenous contrast. Multiplanar CT image reconstructions of the cervical spine were also generated. RADIATION DOSE REDUCTION: This exam was performed according to the departmental dose-optimization program which includes automated exposure control, adjustment of the mA and/or kV according to patient size and/or use of iterative reconstruction technique. COMPARISON:  MRI head 07/30/2022 and CT head 07/29/2022 FINDINGS: CT HEAD FINDINGS Brain: Hypoattenuation within the periventricular white matter of the right parietal and occipital lobes is favored due to edema. This appears new since PET/CT 12/27/2022. Possible 2.9 x 3.1 cm circumscribed mass in the posterior right parietal lobe (series 2/image 18). This may be the cause of the edema. MRI is  recommended for further evaluation. There is mass effect on the atrium and occipital horn of the right lateral ventricle. 4 mm of leftward midline shift at the septum pellucidum. The basal cisterns are patent. No hydrocephalus. No extra-axial fluid collection. Vascular: No hyperdense vessel or unexpected calcification. Skull: No acute fracture or destructive osseous lesion. Mucosal thickening in the maxillary sinuses. Sinuses/Orbits: No acute finding. Other: None. CT CERVICAL SPINE FINDINGS  Alignment: No evidence of traumatic listhesis. Skull base and vertebrae: No acute fracture. No primary bone lesion or focal pathologic process. Soft tissues and spinal canal: No prevertebral fluid or swelling. No visible canal hematoma. Disc levels: Multilevel spondylosis, disc space height loss, and degenerative endplate changes greatest at C5-C6 where it is advanced. No severe spinal canal narrowing. Upper chest: No acute abnormality. Other: None. IMPRESSION: 1. Findings suspicious for metastasis in the posterior right parietal lobe with surrounding vasogenic edema. 4 mm of leftward midline shift. MRI with and without IV contrast is recommended for further evaluation. 2. No acute fracture in the cervical spine. Electronically Signed   By: Minerva Fester M.D.   On: 03/06/2023 20:44   CT Cervical Spine Wo Contrast Result Date: 03/06/2023 CLINICAL DATA:  Blunt polytrauma, weakness EXAM: CT HEAD WITHOUT CONTRAST CT CERVICAL SPINE WITHOUT CONTRAST TECHNIQUE: Multidetector CT imaging of the head and cervical spine was performed following the standard protocol without intravenous contrast. Multiplanar CT image reconstructions of the cervical spine were also generated. RADIATION DOSE REDUCTION: This exam was performed according to the departmental dose-optimization program which includes automated exposure control, adjustment of the mA and/or kV according to patient size and/or use of iterative reconstruction technique. COMPARISON:   MRI head 07/30/2022 and CT head 07/29/2022 FINDINGS: CT HEAD FINDINGS Brain: Hypoattenuation within the periventricular white matter of the right parietal and occipital lobes is favored due to edema. This appears new since PET/CT 12/27/2022. Possible 2.9 x 3.1 cm circumscribed mass in the posterior right parietal lobe (series 2/image 18). This may be the cause of the edema. MRI is recommended for further evaluation. There is mass effect on the atrium and occipital horn of the right lateral ventricle. 4 mm of leftward midline shift at the septum pellucidum. The basal cisterns are patent. No hydrocephalus. No extra-axial fluid collection. Vascular: No hyperdense vessel or unexpected calcification. Skull: No acute fracture or destructive osseous lesion. Mucosal thickening in the maxillary sinuses. Sinuses/Orbits: No acute finding. Other: None. CT CERVICAL SPINE FINDINGS Alignment: No evidence of traumatic listhesis. Skull base and vertebrae: No acute fracture. No primary bone lesion or focal pathologic process. Soft tissues and spinal canal: No prevertebral fluid or swelling. No visible canal hematoma. Disc levels: Multilevel spondylosis, disc space height loss, and degenerative endplate changes greatest at C5-C6 where it is advanced. No severe spinal canal narrowing. Upper chest: No acute abnormality. Other: None. IMPRESSION: 1. Findings suspicious for metastasis in the posterior right parietal lobe with surrounding vasogenic edema. 4 mm of leftward midline shift. MRI with and without IV contrast is recommended for further evaluation. 2. No acute fracture in the cervical spine. Electronically Signed   By: Minerva Fester M.D.   On: 03/06/2023 20:44   NM PET Image Restag (PS) Skull Base To Thigh Result Date: 01/14/2023 CLINICAL DATA:  Subsequent treatment strategy for diffuse large B-cell lymphoma. EXAM: NUCLEAR MEDICINE PET SKULL BASE TO THIGH TECHNIQUE: 8.7 mCi F-18 FDG was injected intravenously. Full-ring PET  imaging was performed from the skull base to thigh after the radiotracer. CT data was obtained and used for attenuation correction and anatomic localization. Fasting blood glucose: 76 mg/dl COMPARISON:  PET-CT dated 10/22/2022 FINDINGS: Mediastinal blood pool activity: SUV max 2.6 Liver activity: SUV max 3.9 NECK: No hypermetabolic cervical lymphadenopathy. Again noted is focal hypermetabolism along the deep lobe of the left parotid gland, without CT correlate, max SUV 6.5. Incidental CT findings: None. CHEST: Subpleural patchy opacity in the posteromedial right lower lobe (series 7/image  40), max SUV 4.7. Small right supraclavicular nodes measuring up to 9 mm short axis (series 4/image 38), max SUV 3.7. Small bilateral axillary nodes, measuring up to 11 mm short axis on the right and 8 mm short axis on the left (series 4/images 54 and 56), max SUV 3.5 and 2.7 respectively. Right chest port terminating in the upper right atrium. Incidental CT findings: Mild coronary atherosclerosis of the LAD. ABDOMEN/PELVIS: Reference splenic max SUV 3.1, without focal lesion. No abnormal hypermetabolism in the liver, pancreas, or adrenal glands. No evident bowel abdominopelvic lymphadenopathy. Incidental CT findings: Bilateral renal cysts, benign. Status post prostatectomy. SKELETON: No focal hypermetabolic activity to suggest skeletal metastasis. Incidental CT findings: Mild degenerative changes of the visualized thoracolumbar spine. IMPRESSION: Small right supraclavicular and bilateral axillary nodes. Deauville criteria 3. Subpleural patchy opacity in the posteromedial right lower lobe, likely infectious/inflammatory. Attention on follow-up is suggested. Status post prostatectomy. Electronically Signed   By: Charline Bills M.D.   On: 01/14/2023 00:06    SURGICAL PATHOLOGY CASE: [id number] PATIENT: Lolita Cram Surgical Pathology Report     Clinical History: brain mass (cf)     FINAL MICROSCOPIC  DIAGNOSIS:  A.  B. BRAIN TUMOR, RIGHT PARIETAL, EXCISION: -  Morphologically and immunophenotypically consistent with recurrent/relapsed diffuse large B-cell lymphoma (history noted).  Note: The mass consists of large atypical lymphocytes with a vasocentric location.  The cells are PAX5/CD20 positive B cells that coexpress MUM1, BCL6, CD5 and Bcl-2 and are negative for CD10 consistent with a post germinal center/activated immunophenotype (this immunophenotype is similar to the patient's diffuse large B-cell lymphoma diagnosed in May 2024.  The cells are negative for CD30, cyclin D1, EBV ISH.  CD3 highlights background small T cells.  The proliferation rate by Ki-67 approaches 100%.  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  The case was discussed with nurse practitioner Hinton Dyer.  INTRAOPERATIVE CONSULTATION:  A.  Right parietal brain tumor: "Suspicious for lymphoproliferative process" Intraoperative diagnosis rendered by Dr. Luisa Hart at 4:05 PM on 01/08/2024.  GROSS DESCRIPTION:  A.  Received fresh is a 1.2 x 1.1 x 0.2 cm aggregate of tan-pink soft tissue.  The specimen is entirely submitted for frozen section and subsequently transferred to A1 for routine histology.  B.  Received fresh is a 2.0 x 1.6 x 0.3 cm aggregate of pink-tan to hemorrhagic soft tissue fragments.  A representative section is submitted in RPMI for possible flow.  The remaining specimen is entirely submitted in 2 blocks. (KW, 03/11/2023)  Final Diagnosis performed by Orene Desanctis DO.   Electronically signed 03/17/2023 Technical component performed at [employer]. [facility], [address], [place], [place] [address].  Professional component performed at [facility], [address] [place], [place] [address].  Immunohistochemistry Technical component  (if applicable) was performed at [facility]. [address], [place], [place] [address].   IMMUNOHISTOCHEMISTRY DISCLAIMER (if applicable): Some of these immunohistochemical stains may have been developed and the performance characteristics determine by [facility]. Some may not have been cleared or approved by the U.S. Food and Drug Administration. The FDA has determined that such clearance or approval is not necessary. This test is used for clinical purposes. It should not be regarded as investigational or for research. This laboratory is certified under the Clinical Laboratory Improvement Amendments of 1988 (CLIA-88) as qualified to perform high  complexity clinical laboratory testing.  The controls stained appropriately.   IHC stains are performed on formalin fixed, paraffin embedded tissue using a 3,3"diaminobenzidine (DAB) chromogen and Leica Bond Autostainer System. The staining intensity of the nucleus is score manually and is reported as the percentage of tumor cell nuclei demonstrating specific nuclear staining. The specimens are fixed in 10% Neutral Formalin for at least 6 hours and up to 72hrs. These tests are validated on decalcified tissue. Results should be interpreted with caution given the possibility of false negative results on decalcified specimens. Antibody Clones are as follows ER-clone 75F, PR-clone 16, Ki67- clone MM1. Some of these immunohistochemical stains may have been developed and the performance characteristics determined by [facility].    Treatments: surgery: Right parietal craniotomy with cranial navigation using microdissection   Discharge Exam: Blood pressure 128/85, pulse 62, temperature 98 F (36.7 C), temperature source Oral, resp. rate 18, height 5\' 7"  (1.702 m), weight 87.5 kg, SpO2 100%.  Alert and oriented x 4 PERRLA CN II-XII grossly intact MAE, Strength and sensation intact Incision is  clean, dry, and intact  Disposition: Discharge disposition: 01-Home or Self Care       Discharge Instructions     Call MD for:  difficulty breathing, headache or visual disturbances   Complete by: As directed    Call MD for:  hives   Complete by: As directed    Call MD for:  persistant dizziness or light-headedness   Complete by: As directed    Call MD for:  persistant nausea and vomiting   Complete by: As directed    Call MD for:  redness, tenderness, or signs of infection (pain, swelling, redness, odor or green/yellow discharge around incision site)   Complete by: As directed    Call MD for:  severe uncontrolled pain   Complete by: As directed    Diet - low sodium heart healthy   Complete by: As directed    Increase activity slowly   Complete by: As directed    No dressing needed   Complete by: As directed    No wound care   Complete by: As directed       Allergies as of 03/17/2023       Reactions   Amitriptyline Other (See Comments)   Nightmares, Night sweats, Migraines   Amlodipine Swelling   BILATERAL FEET TO ANKLES   Neurontin [gabapentin] Other (See Comments)   Nightmares; Night sweats; Headache        Medication List     STOP taking these medications    oxyCODONE 5 MG immediate release tablet Commonly known as: Oxy IR/ROXICODONE   predniSONE 50 MG tablet Commonly known as: DELTASONE       TAKE these medications    allopurinol 100 MG tablet Commonly known as: ZYLOPRIM Take 100 mg by mouth 2 (two) times daily.   cholecalciferol 25 MCG (1000 UNIT) tablet Commonly known as: VITAMIN D3 Take 2 tablets (2,000 Units total) by mouth daily. What changed: how much to take   clotrimazole-betamethasone cream Commonly known as: LOTRISONE APPLY TO AFFECTED AREA TWICE A DAY What changed: See the new instructions.   diclofenac Sodium 1 % Gel Commonly known as: VOLTAREN APPLY 4 G TOPICALLY 4 (FOUR) TIMES DAILY. APPLY TO RIGHT LATERAL HIP AND THIGH  DOWN TO KNEE What changed:  how much to take when to take this reasons to take this additional instructions   docusate sodium 100 MG capsule Commonly known as: COLACE Take 1  capsule (100 mg total) by mouth 2 (two) times daily.   folic acid 1 MG tablet Commonly known as: FOLVITE Take 1 mg by mouth daily.   furosemide 20 MG tablet Commonly known as: LASIX Take 1 tablet (20 mg total) by mouth daily. What changed:  when to take this reasons to take this   HYDROcodone-acetaminophen 5-325 MG tablet Commonly known as: NORCO/VICODIN Take 1 tablet by mouth every 6 (six) hours as needed for moderate pain (pain score 4-6).   Klor-Con M20 20 MEQ tablet Generic drug: potassium chloride SA TAKE 1 TABLET BY MOUTH TWICE A DAY What changed:  when to take this reasons to take this   lidocaine-prilocaine cream Commonly known as: EMLA Apply to affected area once What changed:  how much to take how to take this when to take this reasons to take this additional instructions   metoCLOPramide 10 MG tablet Commonly known as: REGLAN Take 10 mg by mouth daily as needed for vomiting or nausea.   metoprolol succinate 25 MG 24 hr tablet Commonly known as: Toprol XL Take 1 tablet (25 mg total) by mouth daily.   multivitamin with minerals Tabs tablet Take 1 tablet by mouth daily.   Muscle Rub 10-15 % Crea Apply 1 Application topically daily as needed for muscle pain.   ondansetron 8 MG tablet Commonly known as: Zofran Take 1 tablet (8 mg total) by mouth every 8 (eight) hours as needed for nausea or vomiting. Start on the third day after cyclophosphamide chemotherapy. What changed:  when to take this additional instructions   pantoprazole 40 MG tablet Commonly known as: Protonix Take 1 tablet (40 mg total) by mouth 2 (two) times daily. For 12 weeks What changed:  when to take this additional instructions   Vemlidy 25 MG tablet Generic drug: tenofovir alafenamide Take 1 tablet  (25 mg total) by mouth daily.   Vitamin B Complex Tabs Take 1 tablet by mouth daily. What changed: how much to take               Discharge Care Instructions  (From admission, onward)           Start     Ordered   03/17/23 0000  No dressing needed        03/17/23 1645            Follow-up Information     Tressie Stalker, MD. Schedule an appointment as soon as possible for a visit in 3 week(s).   Specialty: Neurosurgery Contact information: [address] [place] [address] [phone]                 Signed: Val Eagle, DNP, AGNP-C Nurse Practitioner  [facility] [address], [address], [place], [place] [address] P: [phone]    F: [phone]  03/17/2023, 4:45 PM

## 2023-03-17 NOTE — Progress Notes (Signed)
Reviewed AVS, patient expressed understanding of medications, MD follow up reviewed.  Removed IV, Site clean, dry and intact.  Pt transported to Discharge lounge.

## 2023-03-17 NOTE — Progress Notes (Signed)
Mobility Specialist Progress Note;   03/17/23 1600  Mobility  Activity Ambulated independently in hallway  Level of Assistance Standby assist, set-up cues, supervision of patient - no hands on  Assistive Device None  Distance Ambulated (ft) 700 ft  Activity Response Tolerated well  Mobility Referral Yes  Mobility visit 1 Mobility  Mobility Specialist Start Time (ACUTE ONLY) 1600  Mobility Specialist Stop Time (ACUTE ONLY) 1615  Mobility Specialist Time Calculation (min) (ACUTE ONLY) 15 min   Pt eager for mobility. Required no physical assistance during ambulation, SV. Asx throughout and no c/o. Pt returned back to EOB with all needs met.   Caesar Bookman Mobility Specialist Please contact via SecureChat or [employer] [phone]

## 2023-03-17 NOTE — Progress Notes (Signed)
Occupational Therapy Treatment Patient Details Name: Tyler [name] MRN: [id number] DOB: 1950-08-13 Today's Date: 03/17/2023   History of present illness 73 yo male presents to [facility] on 1/9 with LUE and LE weakness, headaches, fall. MRI brain showed R parietal lobe mass with surrounding vasogenic edema and 4 mm of right-to-left shift-differential includes solitary intracranial metastasis versus primary CNS neoplasm. S/p R parietal craniotomy 1/13. 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   OT comments  Patient subjectively feeling better, believes he is at his baseline, and is requesting to go home.  Patient is very close to his baseline, is attending well to his L visual field, essentially independent with ADL completion, and is moving about his room without an AD.  Patient did exhibit an episode of increased tone to L UE while reaching for something with his R arm, but was able to break out of it.  He continues to have slowed fine motor coordination to his left hand, but it is not affecting his functional abilities.  No further OT needs in the acute setting, patient offered outpatient OT for his L arm, but patient does not think he needs it.        If plan is discharge home, recommend the following:  Assist for transportation   Equipment Recommendations  None recommended by OT    Recommendations for Other Services      Precautions / Restrictions Precautions Precautions: Fall Precaution Comments: new crani Restrictions Weight Bearing Restrictions Per Provider Order: No       Mobility Bed Mobility Overal bed mobility: Independent                  Transfers Overall transfer level: Modified independent Equipment used: None                     Balance Overall balance assessment: Mild deficits observed, not formally tested                                          ADL either performed or assessed with clinical judgement   ADL Overall ADL's : At baseline                                            Extremity/Trunk Assessment Upper Extremity Assessment Upper Extremity Assessment: Overall WFL for tasks assessed LUE Deficits / Details: did note some mixed tone to L UE while reaching for an object with RUE.  Breaks out of it and when attending to LUE, mild fine motor coordination LUE Sensation: WNL LUE Coordination: decreased fine motor   Lower Extremity Assessment Lower Extremity Assessment: Defer to PT evaluation   Cervical / Trunk Assessment Cervical / Trunk Assessment: Normal    Vision Patient Visual Report: No change from baseline     Perception Perception Perception: Within Functional Limits Perception-Other Comments: attending to L visual field much better   Praxis Praxis Praxis: WFL    Cognition Arousal: Alert Behavior During Therapy: WFL for tasks assessed/performed Overall Cognitive Status: Within Functional Limits for tasks assessed                   Orientation Level: Person,  Place, Time, Situation     Following Commands: Follows multi-step commands consistently Safety/Judgement: Decreased awareness of deficits Awareness: Anticipatory            Exercises      Shoulder Instructions       General Comments VSS on RA    Pertinent Vitals/ Pain       Pain Assessment Pain Assessment: No/denies pain                                                          Frequency  Min 1X/week        Progress Toward Goals  OT Goals(current goals can now be found in the care plan section)  Progress towards OT goals: Goals met/education completed, patient discharged from OT  Acute Rehab OT Goals Potential to Achieve Goals: Good  Plan      Co-evaluation                 AM-PAC OT "6 Clicks" Daily Activity      Outcome Measure   Help from another person eating meals?: None Help from another person taking care of personal grooming?: None Help from another person toileting, which includes using toliet, bedpan, or urinal?: None Help from another person bathing (including washing, rinsing, drying)?: None Help from another person to put on and taking off regular upper body clothing?: None Help from another person to put on and taking off regular lower body clothing?: None 6 Click Score: 24    End of Session    OT Visit Diagnosis: Unsteadiness on feet (R26.81)   Activity Tolerance Patient tolerated treatment well   Patient Left in bed;with call bell/phone within reach   Nurse Communication Mobility status        Time: 1610-9604 OT Time Calculation (min): 21 min  Charges: OT General Charges $OT Visit: 1 Visit OT Treatments $Self Care/Home Management : 8-22 mins  03/17/2023  RP, OTR/L  Acute Rehabilitation Services  Office:  [phone]   Suzanna Obey 03/17/2023, 12:56 PM

## 2023-03-17 NOTE — Plan of Care (Signed)
  Problem: Education: Goal: Knowledge of General Education information will improve Description: Including pain rating scale, medication(s)/side effects and non-pharmacologic comfort measures Outcome: Progressing   Problem: Clinical Measurements: Goal: Ability to maintain clinical measurements within normal limits will improve Outcome: Progressing   Problem: Clinical Measurements: Goal: Diagnostic test results will improve Outcome: Progressing   

## 2023-03-17 NOTE — Discharge Instructions (Addendum)
Wound Care Do not put any creams, lotions, or ointments on incision. You are fine to shower. Let water run over incision and pat dry.  Activity Walk each and every day, increasing distance each day. No lifting greater than 10 lbs. No driving for 2 weeks; may ride as a passenger locally.  Diet Resume your normal diet.  Call Your Doctor If Any of These Occur Redness, drainage, or swelling at the wound.  Temperature greater than 101 degrees. Severe pain not relieved by pain medication. Incision starts to come apart.  Follow Up Appt Call [phone] today for appointment in 2-3 weeks if you don't already have one or for any problems.

## 2023-03-18 ENCOUNTER — Other Ambulatory Visit: Payer: Self-pay | Admitting: Hematology

## 2023-03-26 NOTE — Progress Notes (Incomplete)
HEMATOLOGY ONCOLOGY CLINIC NOTE  Date of service: 03/28/2023  Patient Care Team: Nechama Guard, FNP as PCP - General (Family Medicine) O'Neal, Ronnald Ramp, MD as PCP - Cardiology (Cardiology) Johney Maine, MD as Consulting Physician (Hematology)  CC f/u for large B cell lymphoma  SUMMARY OF ONCOLOGIC HISTORY: Oncology History  Diffuse large B-cell lymphoma of lymph nodes of multiple regions [facility])  07/25/2022 Initial Diagnosis   Diffuse large B-cell lymphoma of lymph nodes of multiple regions (HCC)   08/06/2022 -  Chemotherapy   Patient is on Treatment Plan : NON-HODGKINS LYMPHOMA R-CHOP q21d       HISTORY OF PRESENTING ILLNESS: Tyler [name] is a 73 y.o. male with a past medical history of prostate cancer status post prostatectomy in 2021, history of bilateral pulmonary embolism after his surgery in 2021 presenting today for follow up and management of MDS.  He originally presented to the ED on 11/28/21 for low back pain with radiation down his leg. He was diagnosed with sciatica at urgent care prior to the ED visit. He had a negative xray and CT. He returned to the ED after developing a fever and hematuria. His urologist had stopped his Eliquis 3 months earlier. While in the ED on 12/10/21 he was noted to be febrile with blood work showing HGB of 6.2 and a UTI. His stool was heme-negative. He was hospitalized for treatment of his UTI with secondary sepsis.  Today, he states that he has been doing okay since our last visit. He feels that he is getting stronger.   However, he continues to have low back pain which is worsened with bending over and lying down on his back. His pain radiates into his lateral right leg. He has been walking for half an hour or so but notes that his pain worsens with walking in the cold. He has not been working since I last saw him. His back pain has improved somewhat since he stopped working. He is scheduled to see his PCP next week and plans to  discuss referral for orthospine at that time.  He notes some dizziness when he takes some of his medications.  Since his last visit he has completely stopped drinking alcohol and use of Ibuprofen. His appetite has improved. His HGB has continued to improve to 10.1. His platelets have also improved to 74,000. His WBC counts continue to remain WNL. CMP also improved.  He reports having some enlarged lymph nodes in the left anterior cervical region x1 month. He denies any dental problems. He denies any other lumps or bumps.  He reports a recent episode of chills. He denies any recent vaccines. He denies signs of infection such as sore throat, sinus drainage, cough, or urinary symptoms. He denies fevers. He denies nausea, vomiting, chest pain, dyspnea or cough. He denies any bowel changes. His weight has decreased 6 pounds over last 1.5 months .   INTERVAL HISTORY:  Tyler [name] is a 73 y.o. male here for continued evaluation and management of his low grade MDS.   Patient was last seen by me on 01/15/2023 and complained of foaming in his urine and itchiness prior to urinating sometimes.  He presented to the ED on 03/06/2023 for brain tumor with vasogenic edema.   Today, he is accompanied by an interpreter and his daughter is also on call. Patient reports that he has been doing well overall since his recent surgery for brain tumor.   Patient reports that his left hand is no  longer clumsy and his hand movement is much better. He has been eating well.   He reports that in the mornings, he feels an unusual lump on his body. However, during physical examination, I did not feel any enlarged lymph nodes.   Patient continues to have his port placed at this time. He continues to have one surgical staple from after his surgery.   Patient denies any fever, chills, night sweats, back pain, abdominal pain, change in bowel habits, or testicular pain/swelling.   REVIEW OF SYSTEMS:    10 Point review of  Systems was done is negative except as noted above.   Past Medical History:  Diagnosis Date   Cancer [facility])    prostate   Follicular lymphoma (HCC) 2024   History of pulmonary embolism    Hypertension    Myelodysplastic syndrome (HCC)    PAF (paroxysmal atrial fibrillation) (HCC)    Recurrent UTI    Past Surgical History:  Procedure Laterality Date   APPLICATION OF CRANIAL NAVIGATION Right 03/10/2023   Procedure: APPLICATION OF CRANIAL NAVIGATION;  Surgeon: Tressie Stalker, MD;  Location: [facility] OR;  Service: Neurosurgery;  Laterality: Right;   BIOPSY  08/21/2022   Procedure: BIOPSY;  Surgeon: Beverley Fiedler, MD;  Location: [facility] ENDOSCOPY;  Service: Gastroenterology;;   CRANIOTOMY Right 03/10/2023   Procedure: PARIETAL CRANIOTOMY;  Surgeon: Tressie Stalker, MD;  Location: [facility] OR;  Service: Neurosurgery;  Laterality: Right;   ESOPHAGOGASTRODUODENOSCOPY (EGD) WITH PROPOFOL N/A 08/21/2022   Procedure: ESOPHAGOGASTRODUODENOSCOPY (EGD) WITH PROPOFOL;  Surgeon: Beverley Fiedler, MD;  Location: [facility] ENDOSCOPY;  Service: Gastroenterology;  Laterality: N/A;   IR IMAGING GUIDED PORT INSERTION  08/26/2022   LYMPHADENECTOMY Bilateral 09/01/2019   Procedure: LYMPHADENECTOMY;  Surgeon: Sebastian Ache, MD;  Location: [facility] ORS;  Service: Urology;  Laterality: Bilateral;   ROBOT ASSISTED LAPAROSCOPIC RADICAL PROSTATECTOMY N/A 09/01/2019   Procedure: XI ROBOTIC ASSISTED LAPAROSCOPIC RADICAL PROSTATECTOMY;  Surgeon: Sebastian Ache, MD;  Location: [facility] ORS;  Service: Urology;  Laterality: N/A;  3 HRS   Social History   Tobacco Use   Smoking status: Never   Smokeless tobacco: Never  Vaping Use   Vaping status: Never Used  Substance Use Topics   Alcohol use: Yes    Comment: at least one 40 oz beer daily   Drug use: Never    ALLERGIES:  is allergic to amitriptyline, amlodipine, and neurontin [gabapentin].  MEDICATIONS:  Current Outpatient Medications  Medication Sig Dispense Refill   allopurinol (ZYLOPRIM) 100  MG tablet TAKE 1 TABLET BY MOUTH TWICE A DAY 168 tablet 2   B Complex Vitamins (VITAMIN B COMPLEX) TABS Take 1 tablet by mouth daily. (Patient taking differently: Take 2 tablets by mouth daily.) 30 tablet 0   cholecalciferol (VITAMIN D3) 25 MCG (1000 UNIT) tablet Take 2 tablets (2,000 Units total) by mouth daily. (Patient taking differently: Take 1,000 Units by mouth daily.)     clotrimazole-betamethasone (LOTRISONE) cream APPLY TO AFFECTED AREA TWICE A DAY 45 g 0   diclofenac Sodium (VOLTAREN) 1 % GEL APPLY 4 G TOPICALLY 4 (FOUR) TIMES DAILY. APPLY TO RIGHT LATERAL HIP AND THIGH DOWN TO KNEE (Patient taking differently: Apply 1 Application topically daily as needed (For pain).) 300 g 1   docusate sodium (COLACE) 100 MG capsule Take 1 capsule (100 mg total) by mouth 2 (two) times daily. 10 capsule 0   folic acid (FOLVITE) 1 MG tablet Take 1 mg by mouth daily.     furosemide (LASIX) 20 MG tablet Take  1 tablet (20 mg total) by mouth daily. (Patient taking differently: Take 20 mg by mouth daily as needed for fluid or edema.) 90 tablet 1   HYDROcodone-acetaminophen (NORCO/VICODIN) 5-325 MG tablet Take 1 tablet by mouth every 6 (six) hours as needed for moderate pain (pain score 4-6). 20 tablet 0   KLOR-CON M20 20 MEQ tablet TAKE 1 TABLET BY MOUTH TWICE A DAY 60 tablet 5   lidocaine-prilocaine (EMLA) cream Apply to affected area once (Patient taking differently: Apply 1 Application topically as needed (port).) 30 g 3   Menthol-Methyl Salicylate (MUSCLE RUB) 10-15 % CREA Apply 1 Application topically daily as needed for muscle pain.     metoCLOPramide (REGLAN) 10 MG tablet Take 10 mg by mouth daily as needed for vomiting or nausea.     metoprolol succinate (TOPROL XL) 25 MG 24 hr tablet Take 1 tablet (25 mg total) by mouth daily. 90 tablet 3   Multiple Vitamin (MULTIVITAMIN WITH MINERALS) TABS tablet Take 1 tablet by mouth daily.     ondansetron (ZOFRAN) 8 MG tablet Take 1 tablet (8 mg total) by mouth every  8 (eight) hours as needed for nausea or vomiting. Start on the third day after cyclophosphamide chemotherapy. (Patient taking differently: Take 8 mg by mouth daily as needed for nausea or vomiting.) 30 tablet 1   pantoprazole (PROTONIX) 40 MG tablet Take 1 tablet (40 mg total) by mouth 2 (two) times daily. For 12 weeks (Patient taking differently: Take 40 mg by mouth daily.)  \   Tenofovir Alafenamide Fumarate (VEMLIDY) 25 MG TABS Take 1 tablet (25 mg total) by mouth daily. 30 tablet 5   No current facility-administered medications for this visit.   Facility-Administered Medications Ordered in Other Visits  Medication Dose Route Frequency Provider Last Rate Last Admin   sodium chloride flush (NS) 0.9 % injection 10 mL  10 mL Intracatheter PRN Johney Maine, MD   10 mL at 03/28/23 1256    PHYSICAL EXAMINATION: ECOG PERFORMANCE STATUS: 1 - Symptomatic but completely ambulatory .There were no vitals taken for this visit.  GENERAL:alert, in no acute distress and comfortable SKIN: no acute rashes, no significant lesions EYES: conjunctiva are pink and non-injected, sclera anicteric OROPHARYNX: MMM, no exudates, no oropharyngeal erythema or ulceration NECK: supple, no JVD LYMPH:  no palpable lymphadenopathy in the cervical, axillary or inguinal regions LUNGS: clear to auscultation b/l with normal respiratory effort HEART: regular rate & rhythm ABDOMEN:  normoactive bowel sounds , non tender, not distended. Extremity: no pedal edema PSYCH: alert & oriented x 3 with fluent speech NEURO: no focal motor/sensory deficits   LABORATORY DATA:   I have reviewed the data as listed .    Latest Ref Rng & Units 03/11/2023    5:22 AM 03/10/2023    5:15 AM 03/06/2023    8:20 PM  CBC  WBC 4.0 - 10.5 K/uL 8.5  6.8  4.6   Hemoglobin 13.0 - 17.0 g/dL 29.5  28.4  13.2   Hematocrit 39.0 - 52.0 % 41.6  43.5  42.7   Platelets 150 - 400 K/uL 160  180  160    .    Latest Ref Rng & Units 03/11/2023     5:22 AM 03/10/2023    5:15 AM 03/06/2023    8:20 PM  CMP  Glucose 70 - 99 mg/dL 440  102  725   BUN 8 - 23 mg/dL 23  23  9    Creatinine 0.61 - 1.24 mg/dL  0.98  1.03  0.88   Sodium 135 - 145 mmol/L 141  138  140   Potassium 3.5 - 5.1 mmol/L 3.9  3.9  3.6   Chloride 98 - 111 mmol/L 108  103  105   CO2 22 - 32 mmol/L 24  25  25    Calcium 8.9 - 10.3 mg/dL 8.4  8.8  9.1    Brain tumor biopsy 03/10/2023:     Lymph node biopsy 07/16/2022    RADIOGRAPHIC STUDIES: I have personally reviewed the radiological images as listed and agreed with the findings in the report. CT ABDOMEN PELVIS W CONTRAST Result Date: 03/07/2023 CLINICAL DATA:  Metastatic disease evaluation. History of diffuse large B-cell lymphoma. * Tracking Code: BO * EXAM: CT ABDOMEN AND PELVIS WITH CONTRAST TECHNIQUE: Multidetector CT imaging of the abdomen and pelvis was performed using the standard protocol following bolus administration of intravenous contrast. RADIATION DOSE REDUCTION: This exam was performed according to the departmental dose-optimization program which includes automated exposure control, adjustment of the mA and/or kV according to patient size and/or use of iterative reconstruction technique. CONTRAST:  75mL OMNIPAQUE IOHEXOL 350 MG/ML SOLN COMPARISON:  CT scan nuclear medicine PET-CT scan from 12/27/2022. FINDINGS: Lower chest: There are patchy atelectatic changes in the visualized lung bases. No overt consolidation. No pleural effusion. The heart is normal in size. No pericardial effusion. Hepatobiliary: The liver is normal in size. Non-cirrhotic configuration. No suspicious mass. There are at least 2, subcentimeter hypoattenuating foci in the liver, which are too small to adequately characterize. No intrahepatic or extrahepatic bile duct dilation. No calcified gallstones. Normal gallbladder wall thickness. No pericholecystic inflammatory changes. Pancreas: Small/atrophic pancreas. No peripancreatic fat stranding. No  discrete mass seen. Main pancreatic duct is not dilated. Spleen: Within normal limits. No focal lesion. Adrenals/Urinary Tract: Adrenal glands are unremarkable. No suspicious renal mass. There are multiple bilateral simple renal cysts with largest arising from the right kidney lower pole, laterally measuring up to 2.8 x 3.3 cm. No nephroureterolithiasis or obstructive uropathy. Unremarkable urinary bladder. Stomach/Bowel: No disproportionate dilation of the small or large bowel loops. No evidence of abnormal bowel wall thickening or inflammatory changes. The appendix is unremarkable. There are scattered diverticula mainly in the sigmoid colon, without imaging signs of diverticulitis. Vascular/Lymphatic: No ascites or pneumoperitoneum. No abdominal or pelvic lymphadenopathy, by size criteria. No aneurysmal dilation of the major abdominal arteries. There are mild peripheral atherosclerotic vascular calcifications of the aorta and its major branches. Reproductive: Patient is status post surgical removal of prostate and bilateral seminal vesicles. Other: There are postsurgical changes along the midline inferior anterior abdominal wall. There are bilateral small fat containing inguinal hernias. Musculoskeletal: No suspicious osseous lesions. There are mild multilevel degenerative changes in the visualized spine. Redemonstration of mild loss of height of L4 vertebral body. IMPRESSION: 1. Essentially stable exam when compared to recent PET-CT scan from 12/27/2022. No new lymphadenopathy by size criteria. 2. Multiple other nonacute observations, as described above. Electronically Signed   By: Jules Schick M.D.   On: 03/07/2023 16:36   MR Brain W and Wo Contrast Result Date: 03/07/2023 CLINICAL DATA:  Initial evaluation for brain/CNS neoplasm. EXAM: MRI HEAD WITHOUT AND WITH CONTRAST TECHNIQUE: Multiplanar, multiecho pulse sequences of the brain and surrounding structures were obtained without and with intravenous  contrast. CONTRAST:  8mL GADAVIST GADOBUTROL 1 MMOL/ML IV SOLN COMPARISON:  CT from earlier the same day as well as previous MRI from 07/30/2022. FINDINGS: Brain: Age-related cerebral atrophy. Patchy T2/FLAIR hyperintensity  involving the periventricular and deep white matter both cerebral hemispheres, most likely related to chronic microvascular ischemic disease. No acute or subacute infarct. Heterogeneous enhancing mass positioned at the right parietal lobe measures 4.1 x 3.1 x 3.1 cm (series 17, image 45). Associated susceptibility artifact consistent with necrosis and/or blood products. Surrounding T2/FLAIR signal intensity throughout the adjacent right cerebral hemisphere, consistent with vasogenic edema. Effacement of the posterior right lateral ventricle. Asymmetric dilatation of the right temporal horn consistent with a degree of ventricular trapping. No visible transependymal flow of CSF. Associated 4 mm of right-to-left shift. No other mass lesion or abnormal enhancement. No other acute or chronic intracranial blood products. Pituitary gland and suprasellar region within normal limits. Vascular: Major intracranial vascular flow voids are maintained. Skull and upper cervical spine: Craniocervical junction within normal limits. Bone marrow signal intensity normal. No scalp soft tissue abnormality. Sinuses/Orbits: Globes and orbital soft tissues within normal limits. Mild mucosal thickening present about the ethmoidal air cells and maxillary sinuses. No significant mastoid effusion. Other: None. IMPRESSION: 1. 4.1 x 3.1 x 3.1 cm heterogeneous enhancing mass positioned at the right parietal lobe with surrounding vasogenic edema and 4 mm of right-to-left shift. Differential considerations include a solitary intracranial metastasis versus primary CNS neoplasm. 2. Underlying age-related cerebral atrophy with chronic small vessel ischemic disease. Electronically Signed   By: Rise Mu M.D.   On:  03/07/2023 04:19   CT Head Wo Contrast Result Date: 03/06/2023 CLINICAL DATA:  Blunt polytrauma, weakness EXAM: CT HEAD WITHOUT CONTRAST CT CERVICAL SPINE WITHOUT CONTRAST TECHNIQUE: Multidetector CT imaging of the head and cervical spine was performed following the standard protocol without intravenous contrast. Multiplanar CT image reconstructions of the cervical spine were also generated. RADIATION DOSE REDUCTION: This exam was performed according to the departmental dose-optimization program which includes automated exposure control, adjustment of the mA and/or kV according to patient size and/or use of iterative reconstruction technique. COMPARISON:  MRI head 07/30/2022 and CT head 07/29/2022 FINDINGS: CT HEAD FINDINGS Brain: Hypoattenuation within the periventricular white matter of the right parietal and occipital lobes is favored due to edema. This appears new since PET/CT 12/27/2022. Possible 2.9 x 3.1 cm circumscribed mass in the posterior right parietal lobe (series 2/image 18). This may be the cause of the edema. MRI is recommended for further evaluation. There is mass effect on the atrium and occipital horn of the right lateral ventricle. 4 mm of leftward midline shift at the septum pellucidum. The basal cisterns are patent. No hydrocephalus. No extra-axial fluid collection. Vascular: No hyperdense vessel or unexpected calcification. Skull: No acute fracture or destructive osseous lesion. Mucosal thickening in the maxillary sinuses. Sinuses/Orbits: No acute finding. Other: None. CT CERVICAL SPINE FINDINGS Alignment: No evidence of traumatic listhesis. Skull base and vertebrae: No acute fracture. No primary bone lesion or focal pathologic process. Soft tissues and spinal canal: No prevertebral fluid or swelling. No visible canal hematoma. Disc levels: Multilevel spondylosis, disc space height loss, and degenerative endplate changes greatest at C5-C6 where it is advanced. No severe spinal canal  narrowing. Upper chest: No acute abnormality. Other: None. IMPRESSION: 1. Findings suspicious for metastasis in the posterior right parietal lobe with surrounding vasogenic edema. 4 mm of leftward midline shift. MRI with and without IV contrast is recommended for further evaluation. 2. No acute fracture in the cervical spine. Electronically Signed   By: Minerva Fester M.D.   On: 03/06/2023 20:44   CT Cervical Spine Wo Contrast Result Date: 03/06/2023 CLINICAL DATA:  Blunt polytrauma, weakness EXAM: CT HEAD WITHOUT CONTRAST CT CERVICAL SPINE WITHOUT CONTRAST TECHNIQUE: Multidetector CT imaging of the head and cervical spine was performed following the standard protocol without intravenous contrast. Multiplanar CT image reconstructions of the cervical spine were also generated. RADIATION DOSE REDUCTION: This exam was performed according to the departmental dose-optimization program which includes automated exposure control, adjustment of the mA and/or kV according to patient size and/or use of iterative reconstruction technique. COMPARISON:  MRI head 07/30/2022 and CT head 07/29/2022 FINDINGS: CT HEAD FINDINGS Brain: Hypoattenuation within the periventricular white matter of the right parietal and occipital lobes is favored due to edema. This appears new since PET/CT 12/27/2022. Possible 2.9 x 3.1 cm circumscribed mass in the posterior right parietal lobe (series 2/image 18). This may be the cause of the edema. MRI is recommended for further evaluation. There is mass effect on the atrium and occipital horn of the right lateral ventricle. 4 mm of leftward midline shift at the septum pellucidum. The basal cisterns are patent. No hydrocephalus. No extra-axial fluid collection. Vascular: No hyperdense vessel or unexpected calcification. Skull: No acute fracture or destructive osseous lesion. Mucosal thickening in the maxillary sinuses. Sinuses/Orbits: No acute finding. Other: None. CT CERVICAL SPINE FINDINGS Alignment:  No evidence of traumatic listhesis. Skull base and vertebrae: No acute fracture. No primary bone lesion or focal pathologic process. Soft tissues and spinal canal: No prevertebral fluid or swelling. No visible canal hematoma. Disc levels: Multilevel spondylosis, disc space height loss, and degenerative endplate changes greatest at C5-C6 where it is advanced. No severe spinal canal narrowing. Upper chest: No acute abnormality. Other: None. IMPRESSION: 1. Findings suspicious for metastasis in the posterior right parietal lobe with surrounding vasogenic edema. 4 mm of leftward midline shift. MRI with and without IV contrast is recommended for further evaluation. 2. No acute fracture in the cervical spine. Electronically Signed   By: Minerva Fester M.D.   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 Typically would have done daEPOCH-R but with his r/o MDS high risk of cytopenias esp with etoposide Status post mini CHOP on 08/06/2022.  Expect nadir in  blood counts 10 to 12 days out.  #2 h/o Low grade MDS count had imrproved.  #3 Paroxysmal A fib-- previously not a candidate for anticoag due to anemia and thrombocytopenia-- now these have resolved. Patient notes he has f/u with his cardiology for this.  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 -on tenofovir for hep B reactivation suppression in the context of Rituxan use-- follows with ID 4 PLAN:  -Discussed lab results on 03/28/2023 in detail with patient. CBC showed WBC of 4.9K, hemoglobin of 14.1, and platelets of 189K. -patient's functional status and kidney function is normal -results of his recent biopsy of brain tumor on 03/10/2023 did show findings consistent with High grade lymphoma relapse in one spot in the brain.  -educated patient that 10-20% of people with his type of lymphoma due have recurrence in  the brain due to chemotherapy not being able to get into the brain very well because of the blood brain barrier.  -will order whole body PET scan to ensure there is only localized recurrence in the brain and no other involvement in any other location  -discussed that if there are findings of  involvement in other areas, besides the brain, it would be treated as relapsed lymphoma.  -however, is all of his systemic disease is well controlled and he only has localized recurrence in the brain, we may consider two treatment options assuming there is no other disease: Standard treatment option involving high-dose chemotherapy with methotrexate. With this regimen, he would need to be hospitalized every couple of weeks, 4-6 times with treatment. He may be slightly dose-reduced given his age. Discussed that this regimen can cause several toxicities. Discussed that the main concern with chemotherapy would be bone marrow suppression, which would be prevented with specific medication.  Radiation therapy to the brain, to address the spot in the brain and any other microscopic disease in the brain. Discussed that radiation therapy would not reduce the risk of recurrence and would only treat the local area in the brain. Discussed the concern of potential cognitive issues from radiation therapy. Discussed that the risk of recurrence with radiation therapy is much higher -patient prefers to proceed with the more aggressive approach with standard treatment involving chemotherapy -will order PET scan and repeat MRI. If imaging shows stable findings, we will consider chemotherapy.  -discussed that there may still be a role for radiation therapy if needed such as if he does not tolerate chemotherapy or if it is needed otherwise.  -did not feel any enlarged lymph nodes during physical examination -advised patient to continue to eat as well as he can -patient's incision has healed. Advised patient to connect with Dr. Lovell Sheehan in  the next week to remove his surgical staple -answered all of patient's and his daughter's questions in detail  FOLLOW-UP: Pet/ct in 5-7 days MRI brain w and wo contrast in 5-7 days Scheduling for inpatient HD Methotrexate + Rituxan in the next 7-10 days  The total time spent in the appointment was *** minutes* .  All of the patient's questions were answered with apparent satisfaction. The patient knows to call the clinic with any problems, questions or concerns.   Wyvonnia Lora MD MS AAHIVMS [facility] Hematology/Oncology Physician [facility]  .*Total Encounter Time as defined by the Centers for Medicare and Medicaid Services includes, in addition to the face-to-face time of a patient visit (documented in the note above) non-face-to-face time: obtaining and reviewing outside history, ordering and reviewing medications, tests or procedures, care coordination (communications with other health care professionals or caregivers) and documentation in the medical record.    I,Mitra Faeizi,acting as a Neurosurgeon for Wyvonnia Lora, MD.,have documented all relevant documentation on the behalf of Wyvonnia Lora, MD,as directed by  Wyvonnia Lora, MD while in the presence of Wyvonnia Lora, MD.  ***

## 2023-03-27 ENCOUNTER — Other Ambulatory Visit: Payer: Self-pay

## 2023-03-27 DIAGNOSIS — C8338 Diffuse large B-cell lymphoma, lymph nodes of multiple sites: Secondary | ICD-10-CM

## 2023-03-28 ENCOUNTER — Inpatient Hospital Stay (HOSPITAL_BASED_OUTPATIENT_CLINIC_OR_DEPARTMENT_OTHER): Payer: 59 | Admitting: Hematology

## 2023-03-28 ENCOUNTER — Inpatient Hospital Stay: Payer: 59 | Attending: Hematology

## 2023-03-28 VITALS — BP 142/86 | HR 58 | Temp 98.1°F | Resp 16 | Ht 67.0 in | Wt 185.0 lb

## 2023-03-28 DIAGNOSIS — Z95828 Presence of other vascular implants and grafts: Secondary | ICD-10-CM

## 2023-03-28 DIAGNOSIS — C8338 Diffuse large B-cell lymphoma, lymph nodes of multiple sites: Secondary | ICD-10-CM

## 2023-03-28 DIAGNOSIS — D649 Anemia, unspecified: Secondary | ICD-10-CM

## 2023-03-28 DIAGNOSIS — Z452 Encounter for adjustment and management of vascular access device: Secondary | ICD-10-CM | POA: Diagnosis present

## 2023-03-28 DIAGNOSIS — C8339 Primary central nervous system lymphoma: Secondary | ICD-10-CM

## 2023-03-28 LAB — CMP (CANCER CENTER ONLY)
ALT: 65 U/L — ABNORMAL HIGH (ref 0–44)
AST: 37 U/L (ref 15–41)
Albumin: 4.1 g/dL (ref 3.5–5.0)
Alkaline Phosphatase: 125 U/L (ref 38–126)
Anion gap: 5 (ref 5–15)
BUN: 12 mg/dL (ref 8–23)
CO2: 29 mmol/L (ref 22–32)
Calcium: 8.9 mg/dL (ref 8.9–10.3)
Chloride: 106 mmol/L (ref 98–111)
Creatinine: 0.82 mg/dL (ref 0.61–1.24)
GFR, Estimated: >60 mL/min (ref >60)
Glucose, Bld: 114 mg/dL — ABNORMAL HIGH (ref 70–99)
Potassium: 3.8 mmol/L (ref 3.5–5.1)
Sodium: 140 mmol/L (ref 135–145)
Total Bilirubin: 0.5 mg/dL (ref 0.0–1.2)
Total Protein: 6.5 g/dL (ref 6.5–8.1)

## 2023-03-28 LAB — CBC WITH DIFFERENTIAL (CANCER CENTER ONLY)
Abs Immature Granulocytes: 0.01 10*3/uL (ref 0.00–0.07)
Basophils Absolute: 0 10*3/uL (ref 0.0–0.1)
Basophils Relative: 1 %
Eosinophils Absolute: 0 10*3/uL (ref 0.0–0.5)
Eosinophils Relative: 0 %
HCT: 41 % (ref 39.0–52.0)
Hemoglobin: 14.1 g/dL (ref 13.0–17.0)
Immature Granulocytes: 0 %
Lymphocytes Relative: 29 %
Lymphs Abs: 1.5 10*3/uL (ref 0.7–4.0)
MCH: 30.2 pg (ref 26.0–34.0)
MCHC: 34.4 g/dL (ref 30.0–36.0)
MCV: 87.8 fL (ref 80.0–100.0)
Monocytes Absolute: 0.7 10*3/uL (ref 0.1–1.0)
Monocytes Relative: 15 %
Neutro Abs: 2.7 10*3/uL (ref 1.7–7.7)
Neutrophils Relative %: 55 %
Platelet Count: 189 10*3/uL (ref 150–400)
RBC: 4.67 MIL/uL (ref 4.22–5.81)
RDW: 12 % (ref 11.5–15.5)
WBC Count: 4.9 10*3/uL (ref 4.0–10.5)
nRBC: 0 % (ref 0.0–0.2)

## 2023-03-28 MED ORDER — SODIUM CHLORIDE 0.9% FLUSH
10.0000 mL | INTRAVENOUS | Status: DC | PRN
  Administered 2023-03-28: 10 mL

## 2023-03-28 MED ORDER — HEPARIN SOD (PORK) LOCK FLUSH 100 UNIT/ML IV SOLN
500.0000 [IU] | Freq: Once | INTRAVENOUS | Status: AC | PRN
  Administered 2023-03-28: 500 [IU]

## 2023-03-31 ENCOUNTER — Other Ambulatory Visit: Payer: Self-pay | Admitting: Internal Medicine

## 2023-03-31 DIAGNOSIS — B181 Chronic viral hepatitis B without delta-agent: Secondary | ICD-10-CM

## 2023-04-03 ENCOUNTER — Encounter: Payer: Self-pay | Admitting: Hematology

## 2023-04-07 ENCOUNTER — Other Ambulatory Visit: Payer: Self-pay

## 2023-04-07 ENCOUNTER — Encounter: Payer: Self-pay | Admitting: Hematology

## 2023-04-07 ENCOUNTER — Emergency Department (HOSPITAL_COMMUNITY): Payer: 59

## 2023-04-07 ENCOUNTER — Inpatient Hospital Stay (HOSPITAL_COMMUNITY)
Admission: EM | Admit: 2023-04-07 | Discharge: 2023-04-14 | DRG: 840 | Disposition: A | Discharge status: Home / Self Care | Payer: 59 | Attending: Family Medicine | Admitting: Family Medicine

## 2023-04-07 ENCOUNTER — Encounter (HOSPITAL_COMMUNITY): Payer: Self-pay | Admitting: Internal Medicine

## 2023-04-07 DIAGNOSIS — Z86711 Personal history of pulmonary embolism: Secondary | ICD-10-CM | POA: Diagnosis not present

## 2023-04-07 DIAGNOSIS — E876 Hypokalemia: Secondary | ICD-10-CM | POA: Diagnosis present

## 2023-04-07 DIAGNOSIS — C8330 Diffuse large B-cell lymphoma, unspecified site: Principal | ICD-10-CM

## 2023-04-07 DIAGNOSIS — M109 Gout, unspecified: Secondary | ICD-10-CM | POA: Diagnosis not present

## 2023-04-07 DIAGNOSIS — R531 Weakness: Secondary | ICD-10-CM | POA: Diagnosis not present

## 2023-04-07 DIAGNOSIS — D496 Neoplasm of unspecified behavior of brain: Secondary | ICD-10-CM | POA: Diagnosis present

## 2023-04-07 DIAGNOSIS — G936 Cerebral edema: Secondary | ICD-10-CM | POA: Diagnosis present

## 2023-04-07 DIAGNOSIS — J9859 Other diseases of mediastinum, not elsewhere classified: Secondary | ICD-10-CM | POA: Diagnosis not present

## 2023-04-07 DIAGNOSIS — Z1152 Encounter for screening for COVID-19: Secondary | ICD-10-CM

## 2023-04-07 DIAGNOSIS — R519 Headache, unspecified: Secondary | ICD-10-CM

## 2023-04-07 DIAGNOSIS — D469 Myelodysplastic syndrome, unspecified: Secondary | ICD-10-CM | POA: Diagnosis present

## 2023-04-07 DIAGNOSIS — C7931 Secondary malignant neoplasm of brain: Secondary | ICD-10-CM | POA: Diagnosis present

## 2023-04-07 DIAGNOSIS — Z79899 Other long term (current) drug therapy: Secondary | ICD-10-CM | POA: Diagnosis not present

## 2023-04-07 DIAGNOSIS — Z7189 Other specified counseling: Secondary | ICD-10-CM

## 2023-04-07 DIAGNOSIS — B181 Chronic viral hepatitis B without delta-agent: Secondary | ICD-10-CM | POA: Diagnosis not present

## 2023-04-07 DIAGNOSIS — R29898 Other symptoms and signs involving the musculoskeletal system: Secondary | ICD-10-CM | POA: Diagnosis not present

## 2023-04-07 DIAGNOSIS — C8339 Primary central nervous system lymphoma: Principal | ICD-10-CM | POA: Diagnosis present

## 2023-04-07 DIAGNOSIS — R0902 Hypoxemia: Secondary | ICD-10-CM | POA: Diagnosis not present

## 2023-04-07 DIAGNOSIS — R4182 Altered mental status, unspecified: Secondary | ICD-10-CM | POA: Diagnosis not present

## 2023-04-07 DIAGNOSIS — G629 Polyneuropathy, unspecified: Secondary | ICD-10-CM | POA: Diagnosis present

## 2023-04-07 DIAGNOSIS — R569 Unspecified convulsions: Secondary | ICD-10-CM | POA: Diagnosis not present

## 2023-04-07 DIAGNOSIS — Z9221 Personal history of antineoplastic chemotherapy: Secondary | ICD-10-CM | POA: Diagnosis not present

## 2023-04-07 DIAGNOSIS — C8338 Diffuse large B-cell lymphoma, lymph nodes of multiple sites: Secondary | ICD-10-CM | POA: Diagnosis not present

## 2023-04-07 DIAGNOSIS — R739 Hyperglycemia, unspecified: Secondary | ICD-10-CM | POA: Diagnosis not present

## 2023-04-07 DIAGNOSIS — I2699 Other pulmonary embolism without acute cor pulmonale: Secondary | ICD-10-CM | POA: Diagnosis not present

## 2023-04-07 DIAGNOSIS — T380X5A Adverse effect of glucocorticoids and synthetic analogues, initial encounter: Secondary | ICD-10-CM | POA: Diagnosis not present

## 2023-04-07 DIAGNOSIS — F1010 Alcohol abuse, uncomplicated: Secondary | ICD-10-CM | POA: Diagnosis not present

## 2023-04-07 DIAGNOSIS — Z9079 Acquired absence of other genital organ(s): Secondary | ICD-10-CM

## 2023-04-07 DIAGNOSIS — R930 Abnormal findings on diagnostic imaging of skull and head, not elsewhere classified: Secondary | ICD-10-CM | POA: Diagnosis not present

## 2023-04-07 DIAGNOSIS — Z8719 Personal history of other diseases of the digestive system: Secondary | ICD-10-CM

## 2023-04-07 DIAGNOSIS — R918 Other nonspecific abnormal finding of lung field: Secondary | ICD-10-CM | POA: Diagnosis not present

## 2023-04-07 DIAGNOSIS — E8779 Other fluid overload: Secondary | ICD-10-CM

## 2023-04-07 DIAGNOSIS — I480 Paroxysmal atrial fibrillation: Secondary | ICD-10-CM | POA: Diagnosis present

## 2023-04-07 DIAGNOSIS — Z888 Allergy status to other drugs, medicaments and biological substances status: Secondary | ICD-10-CM

## 2023-04-07 DIAGNOSIS — R0602 Shortness of breath: Secondary | ICD-10-CM | POA: Diagnosis not present

## 2023-04-07 DIAGNOSIS — Z8546 Personal history of malignant neoplasm of prostate: Secondary | ICD-10-CM

## 2023-04-07 DIAGNOSIS — Z87440 Personal history of urinary (tract) infections: Secondary | ICD-10-CM

## 2023-04-07 DIAGNOSIS — Z5111 Encounter for antineoplastic chemotherapy: Secondary | ICD-10-CM | POA: Diagnosis not present

## 2023-04-07 DIAGNOSIS — I10 Essential (primary) hypertension: Secondary | ICD-10-CM | POA: Diagnosis present

## 2023-04-07 DIAGNOSIS — Z8572 Personal history of non-Hodgkin lymphomas: Secondary | ICD-10-CM | POA: Diagnosis not present

## 2023-04-07 LAB — CBC WITH DIFFERENTIAL/PLATELET
Abs Immature Granulocytes: 0.02 10*3/uL (ref 0.00–0.07)
Basophils Absolute: 0 10*3/uL (ref 0.0–0.1)
Basophils Relative: 1 %
Eosinophils Absolute: 0 10*3/uL (ref 0.0–0.5)
Eosinophils Relative: 0 %
HCT: 43.1 % (ref 39.0–52.0)
Hemoglobin: 14.4 g/dL (ref 13.0–17.0)
Immature Granulocytes: 0 %
Lymphocytes Relative: 33 %
Lymphs Abs: 1.7 10*3/uL (ref 0.7–4.0)
MCH: 30.2 pg (ref 26.0–34.0)
MCHC: 33.4 g/dL (ref 30.0–36.0)
MCV: 90.4 fL (ref 80.0–100.0)
Monocytes Absolute: 1 10*3/uL (ref 0.1–1.0)
Monocytes Relative: 19 %
Neutro Abs: 2.4 10*3/uL (ref 1.7–7.7)
Neutrophils Relative %: 47 %
Platelets: 156 10*3/uL (ref 150–400)
RBC: 4.77 MIL/uL (ref 4.22–5.81)
RDW: 12 % (ref 11.5–15.5)
WBC: 5.1 10*3/uL (ref 4.0–10.5)
nRBC: 0 % (ref 0.0–0.2)

## 2023-04-07 LAB — COMPREHENSIVE METABOLIC PANEL
ALT: 59 U/L — ABNORMAL HIGH (ref 0–44)
AST: 40 U/L (ref 15–41)
Albumin: 3.6 g/dL (ref 3.5–5.0)
Alkaline Phosphatase: 115 U/L (ref 38–126)
Anion gap: 11 (ref 5–15)
BUN: 8 mg/dL (ref 8–23)
CO2: 23 mmol/L (ref 22–32)
Calcium: 9 mg/dL (ref 8.9–10.3)
Chloride: 108 mmol/L (ref 98–111)
Creatinine, Ser: 0.94 mg/dL (ref 0.61–1.24)
GFR, Estimated: >60 mL/min (ref >60)
Glucose, Bld: 102 mg/dL — ABNORMAL HIGH (ref 70–99)
Potassium: 3.9 mmol/L (ref 3.5–5.1)
Sodium: 142 mmol/L (ref 135–145)
Total Bilirubin: 0.4 mg/dL (ref 0.0–1.2)
Total Protein: 6.2 g/dL — ABNORMAL LOW (ref 6.5–8.1)

## 2023-04-07 LAB — RESP PANEL BY RT-PCR (RSV, FLU A&B, COVID)  RVPGX2
Influenza A by PCR: NEGATIVE
Influenza B by PCR: NEGATIVE
Resp Syncytial Virus by PCR: NEGATIVE
SARS Coronavirus 2 by RT PCR: NEGATIVE

## 2023-04-07 LAB — URINALYSIS, ROUTINE W REFLEX MICROSCOPIC
Bilirubin Urine: NEGATIVE
Glucose, UA: NEGATIVE mg/dL
Hgb urine dipstick: NEGATIVE
Ketones, ur: NEGATIVE mg/dL
Leukocytes,Ua: NEGATIVE
Nitrite: NEGATIVE
Protein, ur: NEGATIVE mg/dL
Specific Gravity, Urine: 1.013 (ref 1.005–1.030)
pH: 5 (ref 5.0–8.0)

## 2023-04-07 LAB — PROTIME-INR
INR: 1.2 (ref 0.8–1.2)
Prothrombin Time: 15 s (ref 11.4–15.2)

## 2023-04-07 MED ORDER — FOLIC ACID 1 MG PO TABS
1.0000 mg | ORAL_TABLET | Freq: Every day | ORAL | Status: DC
  Administered 2023-04-07 – 2023-04-14 (×8): 1 mg via ORAL
  Filled 2023-04-07 (×8): qty 1

## 2023-04-07 MED ORDER — ALLOPURINOL 100 MG PO TABS
100.0000 mg | ORAL_TABLET | Freq: Two times a day (BID) | ORAL | Status: DC
  Administered 2023-04-07 – 2023-04-14 (×15): 100 mg via ORAL
  Filled 2023-04-07 (×15): qty 1

## 2023-04-07 MED ORDER — STROKE: EARLY STAGES OF RECOVERY BOOK
Freq: Once | Status: AC
  Filled 2023-04-07: qty 1

## 2023-04-07 MED ORDER — SODIUM BICARBONATE 650 MG PO TABS
650.0000 mg | ORAL_TABLET | Freq: Three times a day (TID) | ORAL | Status: DC
  Administered 2023-04-07 – 2023-04-08 (×3): 650 mg via ORAL
  Filled 2023-04-07 (×3): qty 1

## 2023-04-07 MED ORDER — GADOBUTROL 1 MMOL/ML IV SOLN
8.0000 mL | Freq: Once | INTRAVENOUS | Status: AC | PRN
  Administered 2023-04-07: 8 mL via INTRAVENOUS

## 2023-04-07 MED ORDER — VITAMIN D 25 MCG (1000 UNIT) PO TABS
1000.0000 [IU] | ORAL_TABLET | Freq: Every day | ORAL | Status: DC
Start: 2023-04-07 — End: 2023-04-14
  Administered 2023-04-07 – 2023-04-14 (×8): 1000 [IU] via ORAL
  Filled 2023-04-07 (×8): qty 1

## 2023-04-07 MED ORDER — ACETAMINOPHEN 325 MG PO TABS
650.0000 mg | ORAL_TABLET | ORAL | Status: DC | PRN
  Administered 2023-04-09 – 2023-04-13 (×4): 650 mg via ORAL
  Filled 2023-04-07 (×6): qty 2

## 2023-04-07 MED ORDER — METOPROLOL SUCCINATE ER 25 MG PO TB24
25.0000 mg | ORAL_TABLET | Freq: Every day | ORAL | Status: DC
  Administered 2023-04-07 – 2023-04-14 (×7): 25 mg via ORAL
  Filled 2023-04-07 (×8): qty 1

## 2023-04-07 MED ORDER — ONDANSETRON HCL 8 MG PO TABS
8.0000 mg | ORAL_TABLET | Freq: Three times a day (TID) | ORAL | Status: DC | PRN
  Administered 2023-04-07: 8 mg via ORAL
  Filled 2023-04-07: qty 2

## 2023-04-07 MED ORDER — ACETAMINOPHEN 650 MG RE SUPP: 650.0000 mg | RECTAL | Status: DC | PRN

## 2023-04-07 MED ORDER — ACETAMINOPHEN 160 MG/5ML PO SOLN: 650.0000 mg | ORAL | Status: DC | PRN

## 2023-04-07 MED ORDER — DEXAMETHASONE SODIUM PHOSPHATE 10 MG/ML IJ SOLN
6.0000 mg | Freq: Four times a day (QID) | INTRAMUSCULAR | Status: DC
Start: 2023-04-07 — End: 2023-04-09
  Administered 2023-04-07 – 2023-04-09 (×9): 6 mg via INTRAVENOUS
  Filled 2023-04-07 (×10): qty 1

## 2023-04-07 MED ORDER — B COMPLEX-C PO TABS
1.0000 | ORAL_TABLET | Freq: Every day | ORAL | Status: DC
  Administered 2023-04-07 – 2023-04-14 (×8): 1 via ORAL
  Filled 2023-04-07 (×9): qty 1

## 2023-04-07 MED ORDER — TENOFOVIR ALAFENAMIDE FUMARATE 25 MG PO TABS
25.0000 mg | ORAL_TABLET | Freq: Every day | ORAL | Status: DC
  Administered 2023-04-07 – 2023-04-14 (×8): 25 mg via ORAL
  Filled 2023-04-07 (×9): qty 1

## 2023-04-07 NOTE — Consult Note (Signed)
 Providing Compassionate, Quality Care - Together   Reason for Consult: Left upper extremity weakness Referring Physician: Dr. Juliana Ocean Susman is an 73 y.o. male.  HPI: Tyler [name] presents to the emergency department today with a complaint of left upper extremity weakness. He underwent a right parietal craniotomy for resection of a brain tumor by Dr. Larrie Po on 03/10/2023.  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  He progressed with therapies enough to discharge home from the hospital.  He is already established with oncology. He was seen in follow up at [facility] on 04/03/2023. At that visit, his daughter reported Tyler [name] was scheduled for a follow-up MRI and a PET scan on 04/09/2023. She reported that Tyler [name] had been doing very well since surgery when he discharged from the hospital, but the day of the office visit, she noticed he was processing things slower than usual.  Tyler [name] reported feeling "foggy." The plan was made for him to move forward with his scheduled imaging. His daughter was given instructions on symptoms that would warrant return to the hospital for sooner evaluation.  His son-in-law is at the bedside presently. He reports the patient seemed more confused this morning and was complaining of a headache. Tyler [name] was unable to put his password in his phone, which is something he can typically do. His son-in-law also noticed weakness in Tyler [name]'s left upper and lower extremities. Tyler [name] required a great deal of assistance with ambulation. No reports of nausea, vomiting, or changes in speech.  Past Medical History:  Diagnosis Date   Cancer [facility])    prostate   Follicular lymphoma (HCC) 2024   History of pulmonary embolism    Hypertension    Myelodysplastic syndrome (HCC)    PAF (paroxysmal atrial fibrillation) (HCC)    Recurrent UTI     Past Surgical History:  Procedure Laterality Date   APPLICATION OF CRANIAL  NAVIGATION Right 03/10/2023   Procedure: APPLICATION OF CRANIAL NAVIGATION;  Surgeon: Garry Kansas, MD;  Location: [facility] OR;  Service: Neurosurgery;  Laterality: Right;   BIOPSY  08/21/2022   Procedure: BIOPSY;  Surgeon: Nannette Babe, MD;  Location: [facility] ENDOSCOPY;  Service: Gastroenterology;;   CRANIOTOMY Right 03/10/2023   Procedure: PARIETAL CRANIOTOMY;  Surgeon: Garry Kansas, MD;  Location: [facility] OR;  Service: Neurosurgery;  Laterality: Right;   ESOPHAGOGASTRODUODENOSCOPY (EGD) WITH PROPOFOL  N/A 08/21/2022   Procedure: ESOPHAGOGASTRODUODENOSCOPY (EGD) WITH PROPOFOL ;  Surgeon: Nannette Babe, MD;  Location: [facility] ENDOSCOPY;  Service: Gastroenterology;  Laterality: N/A;   IR IMAGING GUIDED PORT INSERTION  08/26/2022   LYMPHADENECTOMY Bilateral 09/01/2019   Procedure: LYMPHADENECTOMY;  Surgeon: Osborn Blaze, MD;  Location: [facility] ORS;  Service: Urology;  Laterality: Bilateral;   ROBOT ASSISTED LAPAROSCOPIC RADICAL PROSTATECTOMY N/A 09/01/2019   Procedure: XI ROBOTIC ASSISTED LAPAROSCOPIC RADICAL PROSTATECTOMY;  Surgeon: Osborn Blaze, MD;  Location: [facility] ORS;  Service: Urology;  Laterality: N/A;  3 HRS    Family History  Problem Relation Age of Onset   Stroke Father     Social History:  reports that he has never smoked. He has never used smokeless tobacco. He reports current alcohol  use. He reports that he does not use drugs.  Allergies:  Allergies  Allergen Reactions   Amitriptyline  Other (See Comments)    Nightmares, Night sweats, Migraines    Amlodipine Swelling    BILATERAL FEET TO ANKLES   Neurontin  [Gabapentin ] Other (See Comments)    Nightmares; Night sweats; Headache  Medications: I have reviewed the patient's current medications.  Results for orders placed or performed during the hospital encounter of 04/07/23 (from the past 48 hours)  CBC with Differential     Status: None   Collection Time: 04/07/23 10:16 AM  Result Value Ref Range   WBC 5.1 4.0 - 10.5 K/uL   RBC 4.77  4.22 - 5.81 MIL/uL   Hemoglobin 14.4 13.0 - 17.0 g/dL   HCT 16.1 09.6 - 04.5 %   MCV 90.4 80.0 - 100.0 fL   MCH 30.2 26.0 - 34.0 pg   MCHC 33.4 30.0 - 36.0 g/dL   RDW 40.9 81.1 - 91.4 %   Platelets 156 150 - 400 K/uL   nRBC 0.0 0.0 - 0.2 %   Neutrophils Relative % 47 %   Neutro Abs 2.4 1.7 - 7.7 K/uL   Lymphocytes Relative 33 %   Lymphs Abs 1.7 0.7 - 4.0 K/uL   Monocytes Relative 19 %   Monocytes Absolute 1.0 0.1 - 1.0 K/uL   Eosinophils Relative 0 %   Eosinophils Absolute 0.0 0.0 - 0.5 K/uL   Basophils Relative 1 %   Basophils Absolute 0.0 0.0 - 0.1 K/uL   Immature Granulocytes 0 %   Abs Immature Granulocytes 0.02 0.00 - 0.07 K/uL    Comment: Performed at [facility], [address] [place], [place] [address]    CT Head Wo Contrast Result Date: 04/07/2023 CLINICAL DATA:  Facial paralysis/weakness (CN 7) EXAM: CT HEAD WITHOUT CONTRAST TECHNIQUE: Contiguous axial images were obtained from the base of the skull through the vertex without intravenous contrast. RADIATION DOSE REDUCTION: This exam was performed according to the departmental dose-optimization program which includes automated exposure control, adjustment of the mA and/or kV according to patient size and/or use of iterative reconstruction technique. COMPARISON:  Head CT 03/06/2023, brain MR 03/07/2023 FINDINGS: Brain: No hemorrhage. No hydrocephalus. No extra-axial fluid collection postsurgical changes from a right parietal craniotomy with interval increase in size of the residual/recurrent tumor at the craniotomy site, now measuring up to 4.7 x 2.7 cm, previously 3.2 x 2.5 cm, when measured in a similar orientation. There is increased leftward midline shift, now measuring up to 6 mm, previously 3 mm. Unchanged disproportionate enlargement of the right temporal horn, worrisome for ventricular entrapment. Vascular: No hyperdense vessel or unexpected calcification. Skull: Normal. Negative for fracture or focal lesion. Status  post parietal craniotomy Sinuses/Orbits: No middle ear or mastoid effusion. Mucosal thickening in the bilateral maxillary sinuses, right-greater-than-left orbits are unremarkable. Other: None. IMPRESSION: 1. Postsurgical changes from a right parietal craniotomy with interval increase in size of the residual/recurrent tumor at the craniotomy site, now measuring up to 4.7 x 2.7 cm, previously 3.2 x 2.5 cm. There is increased leftward midline shift, now measuring up to 6 mm, previously 3 mm. Recommend further evaluation with brain MRI. 2. Unchanged asymmetric enlargement of the right temporal horn, compatible with ventricular entrapment Electronically Signed   By: Clora Dane M.D.   On: 04/07/2023 10:57    Review of Systems  Unable to perform ROS: Mental status change   Blood pressure (!) 160/96, pulse 62, temperature 98.2 F (36.8 C), temperature source Oral, resp. rate 14, SpO2 100%. Estimated body mass index is 28.98 kg/m as calculated from the following:   Height as of 03/28/23: 5\' 7"  (1.702 m).   Weight as of 03/28/23: 83.9 kg.  Physical Exam Constitutional:      Appearance: Normal appearance.  HENT:  Head: Normocephalic and atraumatic.     Nose: Nose normal.     Mouth/Throat:     Mouth: Mucous membranes are moist.     Pharynx: Oropharynx is clear.  Eyes:     Extraocular Movements: Extraocular movements intact.     Pupils: Pupils are equal, round, and reactive to light.  Cardiovascular:     Rate and Rhythm: Normal rate and regular rhythm.     Pulses: Normal pulses.  Pulmonary:     Effort: Pulmonary effort is normal. No respiratory distress.  Abdominal:     General: Abdomen is flat.     Palpations: Abdomen is soft.  Musculoskeletal:     Cervical back: Normal range of motion and neck supple.  Neurological:     Mental Status: He is alert.     GCS: GCS eye subscore is 4. GCS verbal subscore is 4. GCS motor subscore is 6.     Motor: Weakness present.     Comments: Oriented to  self only Left-sided neglect Generalized weakness in LUE, LLE 4-/5 throughout  Psychiatric:        Attention and Perception: He is inattentive.        Speech: Speech is delayed.        Cognition and Memory: Cognition is impaired. Memory is impaired.     Assessment/Plan: Patient with known recurrent diffuse large B cell lymphoma has developed worsening cerebral edema since last scan. Agree with IV steroid administration. Radiation and chemotherapy need to be started as soon as possible. The patient does not need further surgery at this time. He is fine to begin working with therapies.   Henreitta Locus, DNP, AGNP-C Nurse Practitioner  [facility] [address], [address], [place], [place] [address] P: [phone]    F: [phone]  04/07/2023, 11:57 AM

## 2023-04-07 NOTE — Assessment & Plan Note (Signed)
 As above lymphoma recurrence.  Decadron . Neuro check. Aspiration and fall precaution. Swallow eval.

## 2023-04-07 NOTE — Assessment & Plan Note (Signed)
 Vitals:   04/07/23 1100 04/07/23 1115 04/07/23 1130 04/07/23 1350  BP: (!) 151/92 (!) 160/96 (!) 152/96 (!) 182/100  PRN hydralazine .  NPO.

## 2023-04-07 NOTE — ED Triage Notes (Signed)
 Pt. Stated, I've had some weakness in left arm. Im just not sure and my my back is itching. Pt is not a good historian. Had a parietal craniotomy in January for follow up ????

## 2023-04-07 NOTE — ED Provider Triage Note (Signed)
Emergency Medicine Provider Triage Evaluation Note  Tyler [name] , a 73 y.o. male  was evaluated in triage.  Pt complains of a history of craniotomy second to tumor.  Pt complains of weakness in his arms and legs  Review of Systems  Positive: weakness Negative: fever  Physical Exam  There were no vitals taken for this visit. Gen:   Awake, no distress   Resp:  Normal effort  MSK:    Other:    Medical Decision Making  Medically screening exam initiated at 10:16 AM.  Appropriate orders placed.  Tyler [name] was informed that the remainder of the evaluation will be completed by another provider, this initial triage assessment does not replace that evaluation, and the importance of remaining in the ED until their evaluation is complete.     Tyler [name], [place] 04/07/23 1017

## 2023-04-07 NOTE — Progress Notes (Addendum)
 Tyler [name]   DOB:08/11/1950   ZO#:[id number]      ASSESSMENT & PLAN:  1.  CNS relapse of large B-cell lymphoma, right parietal lobe - Status postcraniotomy with gross total resection on 03/10/2023. - CT of head done today 04/07/2023 showed interval increase in size of tumor at the craniotomy site. - MRI of head done today 04/07/2023 increase in size of tumor with slightly increased surrounding edema. - Further treatment recommendations will be forthcoming by Dr. Salomon Cree. - Medical oncology/Dr. Salomon Cree has been following closely  2.  Diffuse large B-cell lymphoma - Initially diagnosed May 2024 - Status post chemotherapy with R-CHOP q21 days - Patient was seen on 03/28/2023 and treatment options were given and explained in detail: High-dose chemotherapy with methotrexate  or radiation therapy to the brain.  Patient preferred to proceed with more aggressive approach with standard treatment involving chemotherapy. - Patient is now admitted with altered mental status.  3.  Altered mental status - Likely due to brain mets - Continue supportive care and treatment plan as recommended by oncology.  4.  Elevated blood glucose  - Monitor blood sugar levels closely  5.  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  A-fib - Status post Eliquis . - Follow closely with cardiology   7.  Prostate cancer - Diagnosed 2021   8.  Hep B antibody positive, chronic - On tenofovir  for hep B reactivation suppression in context of Rituxan  use.  Plan is to continue for 6 months after his last chemotherapy. - Follow ID closely        Code Status Full  Subjective:  Patient seen awake and alert laying on stretcher in ED.  Patient's daughter at bedside.  Patient is notably confused with altered mental status.  Patient's daughter recounts that she took him to see neurosurgery last Thursday, 04/03/2023 and that he had been doing fairly well prior to that however she  noted that his mentation was altered very suddenly and left-sided weakness was noted.  Patient had previously been scheduled to have repeat scans done in 2 days, however these have been done since this hospitalization.  Objective:  Vitals:   04/07/23 1115 04/07/23 1139  BP: (!) 160/96   Pulse: 62   Resp: 14   Temp:  98.2 F (36.8 C)  SpO2: 100%     No intake or output data in the 24 hours ending 04/07/23 1338   REVIEW OF SYSTEMS: Unable to fully obtain Constitutional: Denies fevers, chills or abnormal night sweats Eyes: Denies blurriness of vision, double vision or watery eyes Ears, nose, mouth, throat, and face: Denies mucositis or sore throat Respiratory: Denies cough, dyspnea or wheezes Cardiovascular: Denies palpitation, chest discomfort or lower extremity swelling Gastrointestinal:  Denies nausea, heartburn or change in bowel habits Skin: Denies abnormal skin rashes Lymphatics: Denies new lymphadenopathy or easy bruising Neurological: Denies numbness, tingling or new weaknesses Behavioral/Psych: Mood is stable, no new changes  All other systems were reviewed with the patient and are negative.  PHYSICAL EXAMINATION: ECOG PERFORMANCE STATUS: 3 - Symptomatic, >50% confined to bed  Vitals:   04/07/23 1115 04/07/23 1139  BP: (!) 160/96   Pulse: 62   Resp: 14   Temp:  98.2 F (36.8 C)  SpO2: 100%    There were no vitals filed for this visit.  GENERAL: alert, no distress and comfortable SKIN: skin color, texture, turgor are normal, no rashes or significant lesions EYES: normal,  conjunctiva are pink and non-injected, sclera clear OROPHARYNX: no exudate, no erythema and lips, buccal mucosa, and tongue normal  NECK: supple, thyroid normal size, non-tender, without nodularity LYMPH: no palpable lymphadenopathy in the cervical, axillary or inguinal LUNGS: clear to auscultation and percussion with normal breathing effort HEART: regular rate & rhythm and no murmurs and no  lower extremity edema ABDOMEN: abdomen soft, non-tender and normal bowel sounds MUSCULOSKELETAL: no cyanosis of digits and no clubbing  PSYCH: +Altered mental status NEURO: no focal motor/sensory deficits   All questions were answered. The patient knows to call the clinic with any problems, questions or concerns.   The total time spent in the appointment was 55 minutes encounter with patient including review of chart and various tests results, discussions about plan of care and coordination of care plan  Jacqualin Mate, NP 04/07/2023 1:38 PM    Labs Reviewed:  Lab Results  Component Value Date   WBC 5.1 04/07/2023   HGB 14.4 04/07/2023   HCT 43.1 04/07/2023   MCV 90.4 04/07/2023   PLT 156 04/07/2023   Recent Labs    08/11/22 0316 08/11/22 1544 08/12/22 0515 08/12/22 1400 08/20/22 0302 08/21/22 0334 01/15/23 1300 01/28/23 0840 03/06/23 2020 03/11/23 0522 03/28/23 1303 04/07/23 1016  NA 134*   < > 137   < > 137   < > 141 145   < > 141 140 142  K 3.7   < > 3.3*   < > 4.0   < > 3.6 3.6   < > 3.9 3.8 3.9  CL 99   < > 101   < > 102   < > 107 107   < > 108 106 108  CO2 25   < > 28   < > 24   < > 30 26   < > 24 29 23   GLUCOSE 185*   < > 114*   < > 78   < > 118* 72   < > 116* 114* 102*  BUN 52*   < > 41*   < > 45*   < > 12 15   < > 23 12 8   CREATININE 1.58*   < > 1.44*   < > 3.38*   < > 0.84 0.99   < > 0.98 0.82 0.94  CALCIUM  7.4*   < > 7.5*   < > 7.2*   < > 9.1 9.0   < > 8.4* 8.9 9.0  GFRNONAA 47*   < > 52*   < > 19*   < > >60  --    < > >60 >60 >60  PROT 4.6*  --  4.2*  --  3.9*   < > 6.6 6.4  --   --  6.5 6.2*  ALBUMIN  1.8*  1.7*   < > 1.7*  1.6*   < > 1.7*   < > 4.2  --   --   --  4.1 3.6  AST 33  --  31  --  25   < > 38 33  --   --  37 40  ALT 31  --  31  --  18   < > 46* 40  --   --  65* 59*  ALKPHOS 84  --  79  --  111   < > 100  --   --   --  125 115  BILITOT 3.2*  --  2.5*  --  0.8   < >  0.5 0.6  --   --  0.5 0.4  BILIDIR 1.7*  --  1.3*  --  0.2  --   --   --   --    --   --   --   IBILI 1.5*  --  1.2*  --  0.6  --   --   --   --   --   --   --    < > = values in this interval not displayed.    Studies Reviewed:  MR Brain W and Wo Contrast Result Date: 04/07/2023 CLINICAL DATA:  Provided history: Neuro deficit, persistent/recurrent, CNS neoplasm suspected. Diffuse large B-cell lymphoma. Left-sided weakness with midline shift on CT. EXAM: MRI HEAD WITHOUT AND WITH CONTRAST TECHNIQUE: Multiplanar, multiecho pulse sequences of the brain and surrounding structures were obtained without and with intravenous contrast. CONTRAST:  8mL GADAVIST  GADOBUTROL  1 MMOL/ML IV SOLN COMPARISON:  Head CT 04/07/2023.  Brain MRI 03/07/2023 neck FINDINGS: Intermittently motion degraded examination. Most notably, the axial T1 post-contrast sequence and coronal T2 sequence are moderately motion degraded. Within this limitation findings are as follows. Brain: Since the prior MRI 06/05/2023, there has been right parietal craniotomy for biopsy/debulking of an enhancing right parietal lobe tumor. Non-acute blood products at the operative site. The residual enhancing tumor measures 5.4 x 3.6 x 4.4 cm (previously 4.4 x 3.0 x 3.3 when remeasured on prior). Prominent surrounding edema slightly increased. 6 mm leftward midline shift has also increased (previously 4 mm). Persistent effacement of portions of the right lateral ventricle. Persistent asymmetric prominence of the right lateral ventricle temporal horn consistent with ventricular entrapment. Trace postoperative subdural collection along the anterior right cerebral hemisphere. Mild-to-moderate multifocal T2 FLAIR hyperintense signal abnormality elsewhere within the cerebral white matter, nonspecific but compatible with chronic small vessel ischemic disease. There is no acute infarct. Vascular: Maintained flow voids within the proximal large arterial vessels. Skull and upper cervical spine: Right parietal cranioplasty. No focal worrisome marrow  lesion. Sinuses/orbits: No orbital mass or acute orbital finding.Mild mucosal thickening within the left maxillary sinus. IMPRESSION: 1. Motion degraded examination. 2. Since the prior brain MRI 03/07/2023, there has been right parietal craniotomy for biopsy/debulking of an enhancing right parietal lobe tumor. The residual tumor has increased in size, now measuring 5.4 x 3.6 x 4.4 cm (previously 4.4 x 3.0 x 3.3 cm). Prominent surrounding edema has slightly increased. 6 mm leftward midline shift has also increased. Unchanged asymmetric prominence of the right lateral ventricle temporal horn consistent with ventricular entrapment. Electronically Signed   By: Bascom Lily D.O.   On: 04/07/2023 14:01   CT Head Wo Contrast Result Date: 04/07/2023 CLINICAL DATA:  Facial paralysis/weakness (CN 7) EXAM: CT HEAD WITHOUT CONTRAST TECHNIQUE: Contiguous axial images were obtained from the base of the skull through the vertex without intravenous contrast. RADIATION DOSE REDUCTION: This exam was performed according to the departmental dose-optimization program which includes automated exposure control, adjustment of the mA and/or kV according to patient size and/or use of iterative reconstruction technique. COMPARISON:  Head CT 03/06/2023, brain MR 03/07/2023 FINDINGS: Brain: No hemorrhage. No hydrocephalus. No extra-axial fluid collection postsurgical changes from a right parietal craniotomy with interval increase in size of the residual/recurrent tumor at the craniotomy site, now measuring up to 4.7 x 2.7 cm, previously 3.2 x 2.5 cm, when measured in a similar orientation. There is increased leftward midline shift, now measuring up to 6 mm, previously 3 mm. Unchanged disproportionate enlargement of the right  temporal horn, worrisome for ventricular entrapment. Vascular: No hyperdense vessel or unexpected calcification. Skull: Normal. Negative for fracture or focal lesion. Status post parietal craniotomy Sinuses/Orbits: No  middle ear or mastoid effusion. Mucosal thickening in the bilateral maxillary sinuses, right-greater-than-left orbits are unremarkable. Other: None. IMPRESSION: 1. Postsurgical changes from a right parietal craniotomy with interval increase in size of the residual/recurrent tumor at the craniotomy site, now measuring up to 4.7 x 2.7 cm, previously 3.2 x 2.5 cm. There is increased leftward midline shift, now measuring up to 6 mm, previously 3 mm. Recommend further evaluation with brain MRI. 2. Unchanged asymmetric enlargement of the right temporal horn, compatible with ventricular entrapment Electronically Signed   By: Clora Dane M.D.   On: 04/07/2023 10:57    ADDENDUM  .Patient was Personally and independently interviewed, examined and relevant elements of the history of present illness were reviewed in details and an assessment and plan was created. All elements of the patient's history of present illness , assessment and plan were discussed in details with Lisa Rouson NP. The above documentation reflects our combined findings assessment and plan.   Patient with relapse large B cell lymphoma in the CNS. Was to get outpatient PET/CT abd MRI brain and then start inpatient High dose Methotrexate  with leucovorin  rescue but has not been able to get outpatient imaging in a timely fashion and now readmitted with symptoms of progression of his known rt parietal tumor with significant new peritumoral edema and midline shift. Patient having clumpsiness of left upper extremity and some left sided hemineglect.  Saw patient and his grand daughter Abagail Hoar at bedside in the ED. Discussed that we will need to immediately initiate his High dose methotrexate  treatment that can only be done in [facility] long hospital on 6E oncology use. Recs -Dexamethasone  6mg  IV q6h  -GI prophylaxis with PPI -transfer patient to 6E oncology floor at [facility] -orders placed to start Hight dose methotrexate  tomorrow after  transfer. -neurochecks -aspiration and fall precautions. -sodium bicarbonate  650mg  po TID to try to achieve urine ph>7. Plz check Urine dipstick ph once per shift   Jacquelyn Matt MD MS

## 2023-04-07 NOTE — Assessment & Plan Note (Addendum)
 2/2 to brain met with midline shift from swelling and increase in size of tumour. Cont with decadron . Per neurosurgeon recommendation . MRI shows similar to CT increase in size of residual tumour and 6 mm midline shift.

## 2023-04-07 NOTE — Assessment & Plan Note (Signed)
 Currently in sinus rhythm . Will start pt on iv lopressor .

## 2023-04-07 NOTE — H&P (Signed)
 History and Physical    Patient: Tyler [name] ZOX:[id number] DOB: 06-Aug-1950 DOA: 04/07/2023 DOS: the patient was seen and examined on 04/07/2023 PCP: Elige Gruber, FNP  Patient coming from: Home Chief complaint: Chief Complaint  Patient presents with   Weakness   HPI:  Tyler [name] is a 73 y.o. 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 Similar to his presentation in jan 11/2023.  Pt speaks swahili.Pt has brain mets. Pt s/p parietal resection and now has recurrence. Left sided weakness.  NS-Dr.Bergman and oncology: Dr.Kale. Per edmd Imaging shows midline shift nad Mri is pending and pt to be started on decadron . No incontinence or loss of vision or speech issue no seizures. No saddle anesthesia.  Pt reports feeling little confused and foggy.    >>ED Course: In emergency room patient is alert awake oriented afebrile O2 sats 100% on room air. Vitals:   04/07/23 1130 04/07/23 1139 04/07/23 1300 04/07/23 1350  BP: (!) 152/96   (!) 182/100  Pulse: (!) 58   64  Temp:  98.2 F (36.8 C)  98.3 F (36.8 C)  Resp: 20     SpO2: 100%   96%  TempSrc:  Oral Oral Oral  ED evaluation  so far shows: CMP is pending, normal CBC. Head CT noncontrast shows a recurrent tumor at the craniotomy site and increased size of recurrent tumor along with a left midline shift up to 6 mm. EKG shows  sinus rhythm at 61 PR 162 QTc of 419 nonspecific T wave abnormalities in lateral leads.  In the emergency room  pt has received the following treatment thus far: Medications  dexamethasone  (DECADRON ) injection 6 mg (has no administration in  time range)   Review of Systems  Neurological:  Positive for weakness.   Past Medical History:  Diagnosis Date   Cancer [facility])    prostate   Follicular lymphoma (HCC) 2024   History of pulmonary embolism    Hypertension    Myelodysplastic syndrome (HCC)    PAF (paroxysmal atrial fibrillation) (HCC)    Recurrent UTI    Past Surgical History:  Procedure Laterality Date   APPLICATION OF CRANIAL NAVIGATION Right 03/10/2023   Procedure: APPLICATION OF CRANIAL NAVIGATION;  Surgeon: Garry Kansas, MD;  Location: [facility] OR;  Service: Neurosurgery;  Laterality: Right;   BIOPSY  08/21/2022   Procedure: BIOPSY;  Surgeon: Nannette Babe, MD;  Location: [facility] ENDOSCOPY;  Service: Gastroenterology;;   CRANIOTOMY Right 03/10/2023   Procedure: PARIETAL CRANIOTOMY;  Surgeon: Garry Kansas, MD;  Location: [facility] OR;  Service: Neurosurgery;  Laterality: Right;   ESOPHAGOGASTRODUODENOSCOPY (EGD) WITH PROPOFOL  N/A 08/21/2022   Procedure: ESOPHAGOGASTRODUODENOSCOPY (EGD) WITH PROPOFOL ;  Surgeon: Nannette Babe, MD;  Location: [facility] ENDOSCOPY;  Service: Gastroenterology;  Laterality: N/A;   IR IMAGING GUIDED PORT INSERTION  08/26/2022   LYMPHADENECTOMY Bilateral 09/01/2019   Procedure: LYMPHADENECTOMY;  Surgeon: Osborn Blaze, MD;  Location: [facility] ORS;  Service: Urology;  Laterality: Bilateral;   ROBOT ASSISTED LAPAROSCOPIC RADICAL PROSTATECTOMY N/A 09/01/2019   Procedure: XI ROBOTIC ASSISTED LAPAROSCOPIC RADICAL PROSTATECTOMY;  Surgeon: Osborn Blaze, MD;  Location: [facility] ORS;  Service: Urology;  Laterality: N/A;  3 HRS    reports that he has never smoked. He has never used smokeless tobacco. He reports current alcohol  use. He reports that he does not use drugs.  Allergies  Allergen Reactions   Amitriptyline  Other (See Comments)    Nightmares, Night sweats, Migraines    Amlodipine Swelling    BILATERAL FEET TO ANKLES   Neurontin  [Gabapentin ] Other (See Comments)    Nightmares; Night sweats; Headache    Family  History  Problem Relation Age of Onset   Stroke Father     Prior to Admission medications   Medication Sig Start Date End Date Taking? Authorizing Provider  allopurinol  (ZYLOPRIM ) 100 MG tablet TAKE 1 TABLET BY MOUTH TWICE A DAY 03/18/23   Kale, Gautam Kishore, MD  B Complex Vitamins (VITAMIN B COMPLEX ) TABS Take 1 tablet by mouth daily. Patient taking differently: Take 2 tablets by mouth daily. 12/21/21   Amin, Ankit C, MD  cholecalciferol  (VITAMIN D3) 25 MCG (1000 UNIT) tablet Take 2 tablets (2,000 Units total) by mouth daily. Patient taking differently: Take 1,000 Units by mouth daily. 01/21/22   Kale, Gautam Kishore, MD  clotrimazole -betamethasone  (LOTRISONE ) cream APPLY TO AFFECTED AREA TWICE A DAY 03/18/23   Kale, Gautam Kishore, MD  diclofenac  Sodium (VOLTAREN ) 1 % GEL APPLY 4 G TOPICALLY 4 (FOUR) TIMES DAILY. APPLY TO RIGHT LATERAL HIP AND THIGH DOWN TO KNEE Patient taking differently: Apply 1 Application topically daily as needed (For pain). 06/12/22   Charise Companion, MD  docusate sodium  (COLACE) 100 MG capsule Take 1 capsule (100 mg total) by mouth 2 (two) times daily. 03/17/23   Bergman, Meghan D, NP  folic acid  (FOLVITE ) 1 MG tablet Take 1 mg by mouth daily. 03/27/22   [provider]  furosemide  (LASIX ) 20 MG tablet Take 1 tablet (20 mg total) by mouth daily. Patient taking differently: Take 20 mg by mouth daily as needed for fluid or edema. 12/30/22   O'Neal, Cathay Clonts, MD  HYDROcodone -acetaminophen  (NORCO/VICODIN) 5-325 MG tablet Take 1 tablet by mouth every 6 (six) hours as needed for moderate pain (pain score 4-6). 03/17/23   Bergman, Meghan D, NP  KLOR-CON  M20 20 MEQ tablet TAKE 1 TABLET BY MOUTH TWICE A DAY 03/13/23   Kale, Gautam Kishore, MD  lidocaine -prilocaine  (EMLA ) cream Apply to affected area once Patient taking differently: Apply 1 Application topically as needed (port). 09/18/22   Thayil, Irene T, PA-C  Menthol-Methyl Salicylate (MUSCLE RUB) 10-15 % CREA Apply 1  Application topically daily as needed for muscle pain.    [provider]  metoCLOPramide  (REGLAN ) 10 MG tablet Take 10 mg by mouth daily as needed for vomiting or nausea. 09/13/22   [provider]  metoprolol  succinate (TOPROL  XL) 25 MG 24 hr tablet Take 1 tablet (25 mg total) by mouth daily. 12/30/22   O'NealCathay Clonts, MD  Multiple Vitamin (MULTIVITAMIN WITH MINERALS) TABS tablet Take 1 tablet by mouth daily. 12/21/21   Amin, Ankit C, MD  ondansetron  (ZOFRAN ) 8 MG tablet Take 1 tablet (8 mg total) by mouth every 8 (eight) hours as needed for nausea or vomiting. Start on the third day after cyclophosphamide  chemotherapy. Patient taking differently: Take 8 mg by mouth daily as needed for nausea or vomiting. 07/26/22   Frankie Israel, MD  pantoprazole  (PROTONIX ) 40 MG tablet Take 1 tablet (40 mg total) by mouth 2 (two) times daily. For 12 weeks Patient taking differently: Take 40 mg by mouth daily. 08/28/22 03/07/23  Ghimire, [employer]  M, MD  VEMLIDY  25 MG tablet TAKE 1 TABLET BY MOUTH 1 TIME A DAY 04/01/23   Orlie Bjornstad, MD     Vitals:   04/07/23 1130 04/07/23 1139 04/07/23 1300 04/07/23 1350  BP: (!) 152/96   (!) 182/100  Pulse: (!) 58   64  Resp: 20     Temp:  98.2 F (36.8 C)  98.3 F (36.8 C)  TempSrc:  Oral Oral Oral  SpO2: 100%   96%   Physical Exam Vitals and nursing note reviewed.  Constitutional:      General: He is not in acute distress. HENT:     Head: Normocephalic and atraumatic.     Right Ear: Hearing normal.     Left Ear: Hearing normal.     Nose: Nose normal. No nasal deformity.     Mouth/Throat:     Lips: Pink.     Tongue: No lesions.     Pharynx: Oropharynx is clear.  Eyes:     General: Lids are normal.     Extraocular Movements: Extraocular movements intact.  Cardiovascular:     Rate and Rhythm: Normal rate and regular rhythm.     Heart sounds: Normal heart sounds.  Pulmonary:     Effort: Pulmonary effort is normal.     Breath  sounds: Normal breath sounds.  Abdominal:     General: Bowel sounds are normal. There is no distension.     Palpations: Abdomen is soft. There is no mass.     Tenderness: There is no abdominal tenderness.  Musculoskeletal:     Right lower leg: No edema.     Left lower leg: No edema.  Skin:    General: Skin is warm.  Neurological:     General: No focal deficit present.     Mental Status: He is alert. He is disoriented.     Cranial Nerves: Cranial nerves 2-12 are intact. No cranial nerve deficit, dysarthria or facial asymmetry.     Motor: Weakness present.  Psychiatric:        Attention and Perception: Attention normal.        Mood and Affect: Mood normal.        Speech: Speech normal.        Behavior: Behavior is cooperative.      Labs on Admission: I have personally reviewed following labs and imaging studies Results for orders placed or performed during the hospital encounter of 04/07/23 (from the past 24 hours)  CBC with Differential     Status: None   Collection Time: 04/07/23 10:16 AM  Result Value Ref Range   WBC 5.1 4.0 - 10.5 K/uL   RBC 4.77 4.22 - 5.81 MIL/uL   Hemoglobin 14.4 13.0 - 17.0 g/dL   HCT 16.1 09.6 - 04.5 %   MCV 90.4 80.0 - 100.0 fL   MCH 30.2 26.0 - 34.0 pg   MCHC 33.4 30.0 - 36.0 g/dL   RDW 40.9 81.1 - 91.4 %   Platelets 156 150 - 400 K/uL   nRBC 0.0 0.0 - 0.2 %   Neutrophils Relative % 47 %   Neutro Abs 2.4 1.7 - 7.7 K/uL   Lymphocytes Relative 33 %   Lymphs Abs 1.7 0.7 - 4.0 K/uL   Monocytes Relative 19 %   Monocytes Absolute 1.0 0.1 - 1.0 K/uL   Eosinophils Relative 0 %   Eosinophils Absolute 0.0 0.0 - 0.5 K/uL   Basophils Relative 1 %   Basophils Absolute 0.0 0.0 -  0.1 K/uL   Immature Granulocytes 0 %   Abs Immature Granulocytes 0.02 0.00 - 0.07 K/uL  Comprehensive metabolic panel     Status: Abnormal   Collection Time: 04/07/23 10:16 AM  Result Value Ref Range   Sodium 142 135 - 145 mmol/L   Potassium 3.9 3.5 - 5.1 mmol/L   Chloride  108 98 - 111 mmol/L   CO2 23 22 - 32 mmol/L   Glucose, Bld 102 (H) 70 - 99 mg/dL   BUN 8 8 - 23 mg/dL   Creatinine, Ser 1.61 0.61 - 1.24 mg/dL   Calcium  9.0 8.9 - 10.3 mg/dL   Total Protein 6.2 (L) 6.5 - 8.1 g/dL   Albumin  3.6 3.5 - 5.0 g/dL   AST 40 15 - 41 U/L   ALT 59 (H) 0 - 44 U/L   Alkaline Phosphatase 115 38 - 126 U/L   Total Bilirubin 0.4 0.0 - 1.2 mg/dL   GFR, Estimated >09 >60 mL/min   Anion gap 11 5 - 15  Protime-INR     Status: None   Collection Time: 04/07/23 10:16 AM  Result Value Ref Range   Prothrombin Time 15.0 11.4 - 15.2 seconds   INR 1.2 0.8 - 1.2  Resp panel by RT-PCR (RSV, Flu A&B, Covid) Anterior Nasal Swab     Status: None   Collection Time: 04/07/23 10:57 AM   Specimen: Anterior Nasal Swab  Result Value Ref Range   SARS Coronavirus 2 by RT PCR NEGATIVE NEGATIVE   Influenza A by PCR NEGATIVE NEGATIVE   Influenza B by PCR NEGATIVE NEGATIVE   Resp Syncytial Virus by PCR NEGATIVE NEGATIVE   Recent Results (from the past 720 hours)  Resp panel by RT-PCR (RSV, Flu A&B, Covid) Anterior Nasal Swab     Status: None   Collection Time: 04/07/23 10:57 AM   Specimen: Anterior Nasal Swab  Result Value Ref Range Status   SARS Coronavirus 2 by RT PCR NEGATIVE NEGATIVE Final   Influenza A by PCR NEGATIVE NEGATIVE Final   Influenza B by PCR NEGATIVE NEGATIVE Final    Comment: (NOTE) The Xpert Xpress SARS-CoV-2/FLU/RSV plus assay is intended as an aid in the diagnosis of influenza from Nasopharyngeal swab specimens and should not be used as a sole basis for treatment. Nasal washings and aspirates are unacceptable for Xpert Xpress SARS-CoV-2/FLU/RSV testing.  Fact Sheet for Patients: BloggerCourse.com  Fact Sheet for Healthcare Providers: SeriousBroker.it  This test is not yet approved or cleared by the [place]  FDA and has been authorized for detection and/or diagnosis of SARS-CoV-2 by FDA under an  Emergency Use Authorization (EUA). This EUA will remain in effect (meaning this test can be used) for the duration of the COVID-19 declaration under Section 564(b)(1) of the Act, 21 U.S.C. section 360bbb-3(b)(1), unless the authorization is terminated or revoked.     Resp Syncytial Virus by PCR NEGATIVE NEGATIVE Final    Comment: (NOTE) Fact Sheet for Patients: BloggerCourse.com  Fact Sheet for Healthcare Providers: SeriousBroker.it  This test is not yet approved or cleared by the [place]  FDA and has been authorized for detection and/or diagnosis of SARS-CoV-2 by FDA under an Emergency Use Authorization (EUA). This EUA will remain in effect (meaning this test can be used) for the duration of the COVID-19 declaration under Section 564(b)(1) of the Act, 21 U.S.C. section 360bbb-3(b)(1), unless the authorization is terminated or revoked.  Performed at [facility], [address], [place], [place] [address]  CBC:    Latest Ref Rng & Units 04/07/2023   10:16 AM 03/28/2023    1:03 PM 03/11/2023    5:22 AM  CBC  WBC 4.0 - 10.5 K/uL 5.1  4.9  8.5   Hemoglobin 13.0 - 17.0 g/dL 09.8  11.9  14.7   Hematocrit 39.0 - 52.0 % 43.1  41.0  41.6   Platelets 150 - 400 K/uL 156  189  160    Basic Metabolic Panel: Recent Labs  Lab 04/07/23 1016  NA 142  K 3.9  CL 108  CO2 23  GLUCOSE 102*  BUN 8  CREATININE 0.94  CALCIUM  9.0   Creatinine: Lab Results  Component Value Date   CREATININE 0.94 04/07/2023   CREATININE 0.82 03/28/2023   CREATININE 0.98 03/11/2023   Liver Function Tests:    Latest Ref Rng & Units 04/07/2023   10:16 AM 03/28/2023    1:03 PM 01/28/2023    8:40 AM  Hepatic Function  Total Protein 6.5 - 8.1 g/dL 6.2  6.5  6.4   Albumin  3.5 - 5.0 g/dL 3.6  4.1    AST 15 - 41 U/L 40  37  33   ALT 0 - 44 U/L 59  65  40   Alk Phosphatase 38 - 126 U/L 115  125    Total Bilirubin 0.0 - 1.2 mg/dL 0.4  0.5  0.6      Radiological Exams on Admission: MR Brain W and Wo Contrast Result Date: 04/07/2023 CLINICAL DATA:  Provided history: Neuro deficit, persistent/recurrent, CNS neoplasm suspected. Diffuse large B-cell lymphoma. Left-sided weakness with midline shift on CT. EXAM: MRI HEAD WITHOUT AND WITH CONTRAST TECHNIQUE: Multiplanar, multiecho pulse sequences of the brain and surrounding structures were obtained without and with intravenous contrast. CONTRAST:  8mL GADAVIST  GADOBUTROL  1 MMOL/ML IV SOLN COMPARISON:  Head CT 04/07/2023.  Brain MRI 03/07/2023 neck FINDINGS: Intermittently motion degraded examination. Most notably, the axial T1 post-contrast sequence and coronal T2 sequence are moderately motion degraded. Within this limitation findings are as follows. Brain: Since the prior MRI 06/05/2023, there has been right parietal craniotomy for biopsy/debulking of an enhancing right parietal lobe tumor. Non-acute blood products at the operative site. The residual enhancing tumor measures 5.4 x 3.6 x 4.4 cm (previously 4.4 x 3.0 x 3.3 when remeasured on prior). Prominent surrounding edema slightly increased. 6 mm leftward midline shift has also increased (previously 4 mm). Persistent effacement of portions of the right lateral ventricle. Persistent asymmetric prominence of the right lateral ventricle temporal horn consistent with ventricular entrapment. Trace postoperative subdural collection along the anterior right cerebral hemisphere. Mild-to-moderate multifocal T2 FLAIR hyperintense signal abnormality elsewhere within the cerebral white matter, nonspecific but compatible with chronic small vessel ischemic disease. There is no acute infarct. Vascular: Maintained flow voids within the proximal large arterial vessels. Skull and upper cervical spine: Right parietal cranioplasty. No focal worrisome marrow lesion. Sinuses/orbits: No orbital mass or acute orbital finding.Mild mucosal thickening within the left maxillary  sinus. IMPRESSION: 1. Motion degraded examination. 2. Since the prior brain MRI 03/07/2023, there has been right parietal craniotomy for biopsy/debulking of an enhancing right parietal lobe tumor. The residual tumor has increased in size, now measuring 5.4 x 3.6 x 4.4 cm (previously 4.4 x 3.0 x 3.3 cm). Prominent surrounding edema has slightly increased. 6 mm leftward midline shift has also increased. Unchanged asymmetric prominence of the right lateral ventricle temporal horn consistent with ventricular entrapment. Electronically Signed   By: Bascom Lily  D.O.   On: 04/07/2023 14:01   CT Head Wo Contrast Result Date: 04/07/2023 CLINICAL DATA:  Facial paralysis/weakness (CN 7) EXAM: CT HEAD WITHOUT CONTRAST TECHNIQUE: Contiguous axial images were obtained from the base of the skull through the vertex without intravenous contrast. RADIATION DOSE REDUCTION: This exam was performed according to the departmental dose-optimization program which includes automated exposure control, adjustment of the mA and/or kV according to patient size and/or use of iterative reconstruction technique. COMPARISON:  Head CT 03/06/2023, brain MR 03/07/2023 FINDINGS: Brain: No hemorrhage. No hydrocephalus. No extra-axial fluid collection postsurgical changes from a right parietal craniotomy with interval increase in size of the residual/recurrent tumor at the craniotomy site, now measuring up to 4.7 x 2.7 cm, previously 3.2 x 2.5 cm, when measured in a similar orientation. There is increased leftward midline shift, now measuring up to 6 mm, previously 3 mm. Unchanged disproportionate enlargement of the right temporal horn, worrisome for ventricular entrapment. Vascular: No hyperdense vessel or unexpected calcification. Skull: Normal. Negative for fracture or focal lesion. Status post parietal craniotomy Sinuses/Orbits: No middle ear or mastoid effusion. Mucosal thickening in the bilateral maxillary sinuses, right-greater-than-left orbits  are unremarkable. Other: None. IMPRESSION: 1. Postsurgical changes from a right parietal craniotomy with interval increase in size of the residual/recurrent tumor at the craniotomy site, now measuring up to 4.7 x 2.7 cm, previously 3.2 x 2.5 cm. There is increased leftward midline shift, now measuring up to 6 mm, previously 3 mm. Recommend further evaluation with brain MRI. 2. Unchanged asymmetric enlargement of the right temporal horn, compatible with ventricular entrapment Electronically Signed   By: Clora Dane M.D.   On: 04/07/2023 10:57    Data Reviewed: Relevant notes from primary care and specialist visits, past discharge summaries as available in EHR, including Care Everywhere. Prior diagnostic testing as pertinent to current admission diagnoses, Updated medications and problem lists for reconciliation ED course, including vitals, labs, imaging, treatment and response to treatment,Triage notes, nursing and pharmacy notes and ED provider's notes Notable results as noted in HPI.Discussed case with EDMD/ ED APP/ or Specialty MD on call and as needed.  >>Assessment and Plan: * Weakness of left upper extremity 2/2 to brain met with midline shift from swelling and increase in size of tumour. Cont with decadron . Per neurosurgeon recommendation . MRI shows similar to CT increase in size of residual tumour and 6 mm midline shift.    Brain metastatisis with midline shift . As above lymphoma recurrence.  Decadron . Neuro check. Aspiration and fall precaution. Swallow eval.  Paroxysmal atrial fibrillation (HCC) Currently in sinus rhythm . Will start pt on iv lopressor .   Hypertension Vitals:   04/07/23 1100 04/07/23 1115 04/07/23 1130 04/07/23 1350  BP: (!) 151/92 (!) 160/96 (!) 152/96 (!) 182/100  PRN hydralazine .  NPO.     Bilateral pulmonary embolism (HCC) H/O BL PE. DVT prophylaxis once deemed appropriate.  Currently off any antiplatelet and AC.    DVT prophylaxis:   SCD's Consults:  Neurosurgeon  Oncology.   Advance Care Planning:    Code Status: Full Code   Family Communication:  Son  Disposition Plan:  TBD  Severity of Illness: The appropriate patient status for this patient is INPATIENT. Inpatient status is judged to be reasonable and necessary in order to provide the required intensity of service to ensure the patient's safety. The patient's presenting symptoms, physical exam findings, and initial radiographic and laboratory data in the context of their chronic comorbidities is felt to place them  at high risk for further clinical deterioration. Furthermore, it is not anticipated that the patient will be medically stable for discharge from the hospital within 2 midnights of admission.   * I certify that at the point of admission it is my clinical judgment that the patient will require inpatient hospital care spanning beyond 2 midnights from the point of admission due to high intensity of service, high risk for further deterioration and high frequency of surveillance required.*  Author: Lavanda Porter, MD 04/07/2023 2:18 PM  For on call review [email or web address].   Unresulted Labs (From admission, onward)     Start     Ordered   04/07/23 1410  Urinalysis, dipstick only  Once,   URGENT        04/07/23 1409   04/07/23 1058  Urinalysis, Routine w reflex microscopic -Urine, Clean Catch  Once,   URGENT       Question:  Specimen Source  Answer:  Urine, Clean Catch   04/07/23 1057            Orders Placed This Encounter  Procedures   Resp panel by RT-PCR (RSV, Flu A&B, Covid) Anterior Nasal Swab   CT Head Wo Contrast   MR Brain W and Wo Contrast   CBC with Differential   Comprehensive metabolic panel   Protime-INR   Urinalysis, Routine w reflex microscopic -Urine, Clean Catch   Urinalysis, dipstick only   ED Cardiac monitoring   Swallow screen   NIHSS score documentation NIHSS score range: 0-42   Vital signs   Notify physician (specify)    OOB with assistance   Swallow screen - If patient does NOT pass this screen, place order for SLP eval and treat (SLP2) - swallowing evaluation (BSE, MBS and/or diet order as indicated)   NIH Stroke Scale   Intake and output   Cardiac Monitoring Continuous x 24 hours Indications for use: Acute neurological event   Apply Stroke Care Plan: Ischemic Stroke, TIA   Initiate Adult Central Line Maintenance and Catheter Protocol for patients with central line (CVC, PICC, Port, Hemodialysis, Trialysis)   Discuss with patient and document patient's goals for stroke risk factor reduction   Initiate Oral Care Protocol   Initiate Carrier Fluid Protocol   Provide stroke education material to patient and family.   Nurse to provide smoking / tobacco cessation education   If the patient has passed the Stroke Swallow Screen or has a feeding tube, then RN may order General Admission PRN Orders (through manage orders) for the following patient needs: allergy symptoms (Claritin), cold sores (Carmex), cough (Robitussin DM), eye irritation (Liquifilm Tears), hemorrhoids (Tucks), indigestion (Maalox), minor skin irritation (hydrocortisone  cream), muscle pain Lovena Rubinstein Gay), nose irritation (saline nasal spray) and sore throat (Chloraseptic spray).   Full code   Consult to neurosurgery   Consult to hospitalist   OT eval and treat   PT eval and treat   Oxygen therapy Mode or (Route): Nasal cannula; Liters Per Minute: 2; Keep O2 saturation between: greater than 94 %   SLP eval and treat Reason for evaluation: Cognitive/Language evaluation   ED EKG   Admit to Inpatient (patient's expected length of stay will be greater than 2 midnights or inpatient only procedure)   Fall precautions   Seizure precautions   Aspiration precautions

## 2023-04-07 NOTE — Assessment & Plan Note (Signed)
 H/O BL PE. DVT prophylaxis once deemed appropriate.  Currently off any antiplatelet and AC.

## 2023-04-07 NOTE — ED Provider Notes (Signed)
 [place] [facility] AT [facility] Provider Note   CSN: [id number] Arrival date & time: 04/07/23  4098     History  Chief Complaint  Patient presents with   Weakness    Tyler [name] is a 73 y.o. Swahili speaking male.  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  Patient has undergone chemotherapy for DLBCL and is followed by Dr.Kale with oncology.  On January 13 he underwent partial resection of right parietal brain mass with Dr. Larrie Po (neurosurgery).  He returns today with new onset left upper extremity left lower extremity weakness over the last 2 to 3 days along with headaches.  No fevers, chills, nausea, vomiting, changes in vision, chest pain or shortness of breath.   Weakness      Home Medications Prior to Admission medications   Medication Sig Start Date End Date Taking? Authorizing Provider  allopurinol  (ZYLOPRIM ) 100 MG tablet TAKE 1 TABLET BY MOUTH TWICE A DAY 03/18/23   Kale, Gautam Kishore, MD  B Complex Vitamins (VITAMIN B COMPLEX ) TABS Take 1 tablet by mouth daily. Patient taking differently: Take 2 tablets by mouth daily. 12/21/21   Amin, Ankit C, MD  cholecalciferol  (VITAMIN D3) 25 MCG (1000 UNIT) tablet Take 2 tablets (2,000 Units total) by mouth daily. Patient taking differently: Take 1,000 Units by mouth daily. 01/21/22   Frankie Israel, MD  clotrimazole -betamethasone  (LOTRISONE ) cream APPLY TO AFFECTED AREA TWICE A DAY 03/18/23   Kale, Gautam Kishore, MD  diclofenac  Sodium (VOLTAREN ) 1 % GEL APPLY 4 G TOPICALLY 4 (FOUR) TIMES DAILY. APPLY TO RIGHT LATERAL HIP AND THIGH DOWN TO KNEE Patient taking differently: Apply 1 Application topically daily as needed (For pain). 06/12/22   Charise Companion, MD  docusate sodium  (COLACE) 100 MG capsule Take 1 capsule (100 mg total) by mouth 2 (two) times daily. 03/17/23   Bergman, Meghan D, NP  folic acid  (FOLVITE ) 1 MG tablet Take 1 mg by mouth  daily. 03/27/22   [provider]  furosemide  (LASIX ) 20 MG tablet Take 1 tablet (20 mg total) by mouth daily. Patient taking differently: Take 20 mg by mouth daily as needed for fluid or edema. 12/30/22   O'Neal, Cathay Clonts, MD  HYDROcodone -acetaminophen  (NORCO/VICODIN) 5-325 MG tablet Take 1 tablet by mouth every 6 (six) hours as needed for moderate pain (pain score 4-6). 03/17/23   Bergman, Meghan D, NP  KLOR-CON  M20 20 MEQ tablet TAKE 1 TABLET BY MOUTH TWICE A DAY 03/13/23   Frankie Israel, MD  lidocaine -prilocaine  (EMLA ) cream Apply to affected area once Patient taking differently: Apply 1 Application topically as needed (port). 09/18/22   Thayil, Irene T, PA-C  Menthol-Methyl Salicylate (MUSCLE RUB) 10-15 % CREA Apply 1 Application topically daily as needed for muscle pain.    [provider]  metoCLOPramide  (REGLAN ) 10 MG tablet Take 10 mg by mouth daily as needed for vomiting or nausea. 09/13/22   [provider]  metoprolol  succinate (TOPROL  XL) 25 MG 24 hr tablet Take 1 tablet (25 mg total) by mouth daily. 12/30/22   O'NealCathay Clonts, MD  Multiple Vitamin (MULTIVITAMIN WITH MINERALS) TABS tablet Take 1 tablet by mouth daily. 12/21/21   Amin, Ankit C, MD  ondansetron  (ZOFRAN ) 8 MG tablet Take 1 tablet (8 mg total) by mouth every 8 (eight) hours as needed for nausea or vomiting. Start on the third day after cyclophosphamide  chemotherapy. Patient taking differently: Take 8  mg by mouth daily as needed for nausea or vomiting. 07/26/22   Frankie Israel, MD  pantoprazole  (PROTONIX ) 40 MG tablet Take 1 tablet (40 mg total) by mouth 2 (two) times daily. For 12 weeks Patient taking differently: Take 40 mg by mouth daily. 08/28/22 03/07/23  Ghimire, Estil Heman, MD  VEMLIDY  25 MG tablet TAKE 1 TABLET BY MOUTH 1 TIME A DAY 04/01/23   Orlie Bjornstad, MD      Allergies    Amitriptyline , Amlodipine, and Neurontin  [gabapentin ]    Review of Systems   Review of Systems   Neurological:  Positive for weakness.    Physical Exam Updated Vital Signs BP (!) 160/96   Pulse 62   Temp 98.2 F (36.8 C) (Oral)   Resp 14   SpO2 100%  Physical Exam Vitals and nursing note reviewed.  HENT:     Head: Normocephalic and atraumatic.  Eyes:     Extraocular Movements: Extraocular movements intact.     Pupils: Pupils are equal, round, and reactive to light.  Cardiovascular:     Rate and Rhythm: Normal rate and regular rhythm.  Pulmonary:     Effort: Pulmonary effort is normal.     Breath sounds: Normal breath sounds.  Abdominal:     Palpations: Abdomen is soft.     Tenderness: There is no abdominal tenderness.  Musculoskeletal:     Comments: Asymmetric motor strength with 4 out of 5 strength in the left upper and left lower extremity 5 out of 5 on the right Sensation tact light touch throughout  Skin:    General: Skin is warm and dry.  Neurological:     Mental Status: He is alert and oriented to person, place, and time.     Cranial Nerves: No cranial nerve deficit.     Sensory: No sensory deficit.  Psychiatric:        Mood and Affect: Mood normal.     ED Results / Procedures / Treatments   Labs (all labs ordered are listed, but only abnormal results are displayed) Labs Reviewed  COMPREHENSIVE METABOLIC PANEL - Abnormal; Notable for the following components:      Result Value   Glucose, Bld 102 (*)    Total Protein 6.2 (*)    ALT 59 (*)    All other components within normal limits  RESP PANEL BY RT-PCR (RSV, FLU A&B, COVID)  RVPGX2  CBC WITH DIFFERENTIAL/PLATELET  PROTIME-INR  URINALYSIS, ROUTINE W REFLEX MICROSCOPIC    EKG EKG Interpretation Date/Time:  Monday April 07 2023 11:42:28 EST Ventricular Rate:  61 PR Interval:  162 QRS Duration:  90 QT Interval:  415 QTC Calculation: 418 R Axis:   6  Text Interpretation: Sinus rhythm Borderline T abnormalities, diffuse leads Confirmed by Rafael Bun [phone]) on 04/07/2023 12:22:54  PM  Radiology CT Head Wo Contrast Result Date: 04/07/2023 CLINICAL DATA:  Facial paralysis/weakness (CN 7) EXAM: CT HEAD WITHOUT CONTRAST TECHNIQUE: Contiguous axial images were obtained from the base of the skull through the vertex without intravenous contrast. RADIATION DOSE REDUCTION: This exam was performed according to the departmental dose-optimization program which includes automated exposure control, adjustment of the mA and/or kV according to patient size and/or use of iterative reconstruction technique. COMPARISON:  Head CT 03/06/2023, brain MR 03/07/2023 FINDINGS: Brain: No hemorrhage. No hydrocephalus. No extra-axial fluid collection postsurgical changes from a right parietal craniotomy with interval increase in size of the residual/recurrent tumor at the craniotomy site, now measuring up to 4.7 x  2.7 cm, previously 3.2 x 2.5 cm, when measured in a similar orientation. There is increased leftward midline shift, now measuring up to 6 mm, previously 3 mm. Unchanged disproportionate enlargement of the right temporal horn, worrisome for ventricular entrapment. Vascular: No hyperdense vessel or unexpected calcification. Skull: Normal. Negative for fracture or focal lesion. Status post parietal craniotomy Sinuses/Orbits: No middle ear or mastoid effusion. Mucosal thickening in the bilateral maxillary sinuses, right-greater-than-left orbits are unremarkable. Other: None. IMPRESSION: 1. Postsurgical changes from a right parietal craniotomy with interval increase in size of the residual/recurrent tumor at the craniotomy site, now measuring up to 4.7 x 2.7 cm, previously 3.2 x 2.5 cm. There is increased leftward midline shift, now measuring up to 6 mm, previously 3 mm. Recommend further evaluation with brain MRI. 2. Unchanged asymmetric enlargement of the right temporal horn, compatible with ventricular entrapment Electronically Signed   By: Clora Dane M.D.   On: 04/07/2023 10:57     Procedures Procedures    Medications Ordered in ED Medications  dexamethasone  (DECADRON ) injection 6 mg (6 mg Intravenous Given 04/07/23 1153)  allopurinol  (ZYLOPRIM ) tablet 100 mg (has no administration in time range)  cholecalciferol  (VITAMIN D3) 25 MCG (1000 UNIT) tablet 1,000 Units (has no administration in time range)  Vitamin B Complex  TABS 1 tablet (has no administration in time range)  metoprolol  succinate (TOPROL -XL) 24 hr tablet 25 mg (has no administration in time range)  tenofovir  alafenamide (VEMLIDY ) tablet 25 mg (has no administration in time range)  ondansetron  (ZOFRAN ) tablet 8 mg (has no administration in time range)  folic acid  (FOLVITE ) tablet 1 mg (has no administration in time range)    ED Course/ Medical Decision Making/ A&P Clinical Course as of 04/07/23 1223  Mon Apr 07, 2023  1119 CT head shows  interval increase of residual right parietal tumor with leftward midline shift as well as persistent enlargement of the right temporal horn consistent with ventricular entrapment.  I have ordered the MRI with and without contrast to better evaluate.  I have made patient's oncologist Dr. Salomon Cree aware of this finding and plan for MRI and paged  neurosurgery [MP]  1120 Dr. Salomon Cree recommends dexamethasone  6 mg IV every 6 hours and admission to hospitalist service preferably on 6E [MP]  1142 Discussed with neurosurgery provider on-call who will follow patient during his admission and follow-up on the results of the MRI.  Will admit to medicine [MP]  1157 Discussed with admitting hospitalist who accepts patient for admission [MP]    Clinical Course User Index [MP] Sallyanne Creamer, DO                                 Medical Decision Making 73 year old male with history as above including recent resection of DLBCL brain tumor last month returns for recurrent left-sided weakness over the last 2 to 3 days.  There is certainly some asymmetry on his motor strength testing.  No  other appreciable focal neurologic deficits.  Low suspicion for acute stroke and he is outside of the window for stroke activation.  This is most likely recurrence of known DLBCL.  We obtained a CT head without contrast which did show interval increase in brain mass along with a new midline shift which is likely the cause of his symptoms.  Will reach out to his oncology and neurosurgery teams and plan for admission  Amount and/or Complexity of Data Reviewed Labs: ordered.  Radiology: ordered.  Risk Prescription drug management. Decision regarding hospitalization.           Final Clinical Impression(s) / ED Diagnoses Final diagnoses:  Diffuse large B-cell lymphoma, unspecified body region [facility])  Progressive focal motor weakness  Nonintractable headache, unspecified chronicity pattern, unspecified headache type    Rx / DC Orders ED Discharge Orders     None         Sallyanne Creamer, DO 04/07/23 1223

## 2023-04-08 ENCOUNTER — Encounter: Payer: Self-pay | Admitting: Hematology

## 2023-04-08 DIAGNOSIS — R29898 Other symptoms and signs involving the musculoskeletal system: Secondary | ICD-10-CM | POA: Diagnosis not present

## 2023-04-08 LAB — BASIC METABOLIC PANEL
Anion gap: 17 — ABNORMAL HIGH (ref 5–15)
BUN: 11 mg/dL (ref 8–23)
CO2: 23 mmol/L (ref 22–32)
Calcium: 9.3 mg/dL (ref 8.9–10.3)
Chloride: 101 mmol/L (ref 98–111)
Creatinine, Ser: 0.88 mg/dL (ref 0.61–1.24)
GFR, Estimated: >60 mL/min (ref >60)
Glucose, Bld: 129 mg/dL — ABNORMAL HIGH (ref 70–99)
Potassium: 4 mmol/L (ref 3.5–5.1)
Sodium: 141 mmol/L (ref 135–145)

## 2023-04-08 LAB — CBC
HCT: 42.6 % (ref 39.0–52.0)
Hemoglobin: 14.7 g/dL (ref 13.0–17.0)
MCH: 30.6 pg (ref 26.0–34.0)
MCHC: 34.5 g/dL (ref 30.0–36.0)
MCV: 88.6 fL (ref 80.0–100.0)
Platelets: 178 10*3/uL (ref 150–400)
RBC: 4.81 MIL/uL (ref 4.22–5.81)
RDW: 11.9 % (ref 11.5–15.5)
WBC: 7.6 10*3/uL (ref 4.0–10.5)
nRBC: 0 % (ref 0.0–0.2)

## 2023-04-08 LAB — URINALYSIS, COMPLETE (UACMP) WITH MICROSCOPIC
Bacteria, UA: NONE SEEN
Bilirubin Urine: NEGATIVE
Glucose, UA: 50 mg/dL — AB
Hgb urine dipstick: NEGATIVE
Ketones, ur: NEGATIVE mg/dL
Leukocytes,Ua: NEGATIVE
Nitrite: NEGATIVE
Protein, ur: NEGATIVE mg/dL
Specific Gravity, Urine: 1.014 (ref 1.005–1.030)
pH: 6 (ref 5.0–8.0)

## 2023-04-08 LAB — MAGNESIUM: Magnesium: 1.7 mg/dL (ref 1.7–2.4)

## 2023-04-08 MED ORDER — OXYCODONE HCL 5 MG PO TABS
5.0000 mg | ORAL_TABLET | ORAL | Status: DC | PRN
  Administered 2023-04-11 (×2): 5 mg via ORAL
  Filled 2023-04-08 (×2): qty 1

## 2023-04-08 MED ORDER — SODIUM BICARBONATE 8.4 % IV SOLN
INTRAVENOUS | Status: DC
  Filled 2023-04-08 (×2): qty 1000
  Filled 2023-04-08 (×3): qty 150
  Filled 2023-04-08: qty 1000
  Filled 2023-04-08 (×3): qty 150
  Filled 2023-04-08: qty 1000
  Filled 2023-04-08 (×3): qty 150

## 2023-04-08 MED ORDER — MORPHINE SULFATE (PF) 2 MG/ML IV SOLN
2.0000 mg | INTRAVENOUS | Status: DC | PRN
  Administered 2023-04-08 – 2023-04-11 (×9): 2 mg via INTRAVENOUS
  Filled 2023-04-08 (×9): qty 1

## 2023-04-08 MED ORDER — SODIUM BICARBONATE 650 MG PO TABS
650.0000 mg | ORAL_TABLET | Freq: Three times a day (TID) | ORAL | Status: DC
  Administered 2023-04-08 – 2023-04-14 (×19): 650 mg via ORAL
  Filled 2023-04-08 (×19): qty 1

## 2023-04-08 MED ORDER — MAGNESIUM SULFATE 2 GM/50ML IV SOLN
2.0000 g | Freq: Once | INTRAVENOUS | Status: AC
  Administered 2023-04-08: 2 g via INTRAVENOUS
  Filled 2023-04-08: qty 50

## 2023-04-08 MED ORDER — PANTOPRAZOLE SODIUM 40 MG PO TBEC
40.0000 mg | DELAYED_RELEASE_TABLET | Freq: Every day | ORAL | Status: DC
  Administered 2023-04-08: 40 mg via ORAL
  Filled 2023-04-08: qty 1

## 2023-04-08 NOTE — Progress Notes (Signed)
Progress Note   Patient: Tyler [name] JXB:[id number] DOB: May 03, 1950 DOA: 04/07/2023     1 DOS: the patient was seen and examined on 04/08/2023   Brief hospital course: 72yo with h/o afib not on AC due to high bleeding risk, ETOH use d/o, prostate CA s/p prostatectomy, chronic Hep B on tenofovir, and high-grade B cell lymphoma with brain mets s/p parietal resection and complicated by severe pancytopenia and tumor lysis syndrome who presented on 2/10 with L-sided weakness.  Neurosurgery was consulted and notes significant recurrence of high-grade lymphoma with recommendation to start chemo and/or rads expeditiously.  Oncology is consulted.  Assessment and Plan:  L-sided weakness due to recurrence of large B-cell lymphoma Diffuse large B-cell lymphoma was initially diagnosed May 2024 Status post chemotherapy with R-CHOP q21 days S/p craniotomy with gross total resection on 03/10/2023 Patient was seen on 03/28/2023 and treatment options were given and explained in detail: High-dose chemotherapy with methotrexate or radiation therapy to the brain.  Patient preferred to proceed with more aggressive approach with standard treatment involving chemotherapy Presented with worsening left-sided weakness - although he reports today that symptoms are sporadic CT on 2/10 with interval increase in size of tumor at the craniotomy site MRI on 2/10 with increase in size of tumor with slightly increased surrounding edema Neurosurgery was consulted and recommends expedited chemo/rads therapy He was started on Decadron 6 mg q6h Oncology is consulting, recommends transfer to [facility] for initiation of high-dose methotrexate PT/OT consulting  H/o dysphagia This was an issue during his last hospitalization SLP consulted for diet recommendations (previously on dysphagia 3 diet)   Altered mental status Likely due to brain mets Continue supportive care and treatment plan as recommended by oncology.   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 Follow ID closely Tenofovir may need to be held while on methotrexate - will defer to oncology  Gout Continue allopurinol prn      Consultants: Neurosurgery Oncology OT PT SLP  Procedures: None  Antibiotics: None   30 Day Unplanned Readmission Risk Score    Flowsheet Row ED to Hosp-Admission (Current) from 04/07/2023 in [facility]  30 Day Unplanned Readmission Risk Score (%) 19.5 Filed at 04/08/2023 0401       This score is the patient's risk of an unplanned readmission within 30 days of being discharged (0 -100%). The score is based on dignosis, age, lab data, medications, orders, and past utilization.   Low:  0-14.9   Medium: 15-21.9   High: 22-29.9   Extreme: 30 and above           Subjective: reports feeling ok today.  Symptoms are sporadic and not currently present.  Understands plan to transfer to [facility] for initiation of methotrexate therapy.   Objective: Vitals:   04/08/23 0413 04/08/23 0422  BP: (!) 145/89 139/78  Pulse: 95 76  Resp:  17  Temp:  98 F (36.7 C)  SpO2:  92%    Intake/Output Summary (Last 24 hours) at 04/08/2023  0740 Last data filed at 04/08/2023 0100 Gross per 24 hour  Intake 120 ml  Output 775 ml  Net -655 ml   There were no vitals filed for this visit.  Exam:  General:  Appears calm and comfortable and is in NAD Eyes:  EOMI, normal lids, iris ENT:  grossly normal hearing, lips & tongue, mmm Neck:  no LAD, masses or  thyromegaly Cardiovascular:  RRR, no m/r/g. No LE edema.  Respiratory:   CTA bilaterally with no wheezes/rales/rhonchi.  Normal respiratory effort. Abdomen:  soft, NT, ND Skin:  no rash or induration seen on limited exam Musculoskeletal:  grossly normal tone BUE/BLE, good ROM, no bony abnormality Psychiatric:  grossly normal mood and affect, speech fluent and appropriate, AOx3 Neurologic:  CN 2-12 grossly intact, moves all extremities in coordinated fashion  Data Reviewed: I have reviewed the patient's lab results since admission.  Pertinent labs for today include:   Glucose 129 Anion gap 17 Normal CBC UA WNL     Family Communication: None present; I spoke with his daughter (NP) by telephone  Disposition: Status is: Inpatient Remains inpatient appropriate because: needs urgent treatment     Time spent: 50 minutes  Unresulted Labs (From admission, onward)     Start     Ordered   04/07/23 1410  Urinalysis, dipstick only  Once,   URGENT        04/07/23 1409             Author: Jonah Blue, MD 04/08/2023 7:40 AM  For on call review [email or web address].

## 2023-04-08 NOTE — Evaluation (Signed)
Physical Therapy Evaluation Patient Details Name: Tyler [name] MRN: [id number] DOB: 1950-03-31 Today's Date: 04/08/2023  History of Present Illness  73 yo male presents to [facility] on 2/10 with LUE and LE weakness, and change in mentation. Head CT of brain shows a recurrent tumor at the craniotomy site and increased size of recurrent tumor along with a left midline shift up to 6 mm. 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  Clinical Impression  PTA pt living with his wife and children in single story apartment with level entry. However, pt does report recently sliding out of his bed when he was trying to get up. Pt reports that he has been using his RW off and on due to bouts of unsteadiness and has been able to do his own bathing and dressing. Pt is currently limited in safe mobility by impulsiveness, decreased cognition in terms of safety awareness and command follow. Pt is supervision for bed mobility, contact guard for transfers and min A for short distance ambulation. Pt has 24 hour support at home. Patient will benefit from intensive inpatient follow-up therapy, >3 hours/day. PT will continue to follow acutely.        If plan is discharge home, recommend the following: A little help with bathing/dressing/bathroom;Assistance with cooking/housework;Direct supervision/assist for medications management;Direct supervision/assist for financial management;Assist for transportation;Help with stairs or ramp for entrance;A little help with walking and/or transfers;Supervision due to cognitive status   Can travel by private vehicle    YEs    Equipment Recommendations None recommended by PT  Recommendations for Other Services  Rehab consult    Functional Status Assessment  Patient has had a recent decline in their functional status and demonstrates the ability to make significant improvements in function in a reasonable and predictable amount of time.     Precautions / Restrictions Precautions Precautions: Fall Precaution/Restrictions Comments: crani 1/13 Restrictions Weight Bearing Restrictions Per Provider Order: No      Mobility  Bed Mobility Overal bed mobility: Needs Assistance Bed Mobility: Supine to Sit, Sit to Supine     Supine to sit: Supervision     General bed mobility comments: pt able to move LE out of bed, but leaves L LE in the bed, tried to have pt place both LE back in bed to move LE together towards EoB, difficult to determine if it was a translation issue, or more likely a command follow issue    Transfers Overall transfer level:  Equipment used: None Transfers: Sit to/from Stand Sit to Stand: Contact guard assist           General transfer comment: stands up multiple times from EoB while attempting to have pt sit for LE assessment contact guard for safety, after sitting fot LE assessment, vc for hand placement for power up, reaches up with both UE to power up and steady in standing with RW.    Ambulation/Gait Ambulation/Gait assistance: Contact guard assist, Min assist Gait Distance (Feet): 15 Feet Assistive device: Rolling walker (2 wheels) Gait Pattern/deviations: Step-through pattern, Decreased weight shift to left, Decreased step length - right, Decreased step length - left, Shuffle, Ataxic Gait velocity: variable Gait velocity interpretation: <1.31 ft/sec, indicative of household ambulator   General Gait Details: pt able to walk to sink with CGA for ataxic L LE movement after washing hands  requires minA for management of RW around bed to get to recliner      Balance Overall balance assessment: Mild deficits observed, not formally tested Sitting-balance support: No upper extremity supported, Feet  supported Sitting balance-Leahy Scale: Fair Sitting balance - Comments: supervision Postural control: Posterior lean (with movement of Bilateral LE in sitting) Standing balance support: During functional activity, No upper extremity supported Standing balance-Leahy Scale: Fair Standing balance comment: needs UE support for dynamic balance                             Pertinent Vitals/Pain Pain Assessment Pain Assessment: No/denies pain Faces Pain Scale: No hurt Breathing: normal Negative Vocalization: none Facial Expression: smiling or inexpressive Body Language: relaxed Consolability: no need to console PAINAD Score: 0    Home Living Family/patient expects to be discharged to:: Private residence Living Arrangements: Spouse/significant other Available Help at Discharge: Family Type of Home: Apartment Home Access: Level entry Entrance Stairs-Rails: Right;Left Entrance Stairs-Number of Steps: flight   Home Layout: One level Home Equipment: Agricultural consultant (2 wheels);BSC/3in1      Prior Function Prior Level of Function : Independent/Modified Independent             Mobility Comments: occasional RW use ADLs Comments: Independent to Mod I with occasional assistance from children     Extremity/Trunk Assessment   Upper Extremity Assessment Upper Extremity Assessment: Defer to OT evaluation    Lower Extremity Assessment Lower Extremity Assessment: Difficult to assess due to impaired cognition RLE Deficits / Details: able to move with coordinated movement,strength assessed during ambulation to be functionally ~4/5, denied numbness/tingling LLE Deficits / Details: difficulty follow command for MMT, functionally 3+/5 but requires increased time to complete movement, fatigues quickly, difficulty coordinating with ambulation    Cervical / Trunk Assessment Cervical / Trunk Assessment: Normal Cervical / Trunk Exceptions: prior L4 compression fracture   Communication   Communication Communication: No apparent difficulties Factors Affecting Communication: Non - [place] speaking, interpreter not available (used ipad interpreter Swahili Fred #[id number])    Cognition Arousal: Alert Behavior During Therapy: WFL for tasks assessed/performed, Impulsive (does not wait for command)                             Following commands: Impaired Following commands impaired: Follows one step commands inconsistently, Follows multi-step commands inconsistently     Cueing Cueing Techniques: Verbal cues, Gestural cues, Tactile cues, Visual cues     General Comments General comments (skin integrity, edema, etc.): max HR noted with movement 118bpm    Exercises     Assessment/Plan    PT Assessment Patient needs continued PT services  PT Problem List Decreased strength;Decreased activity tolerance;Decreased mobility;Decreased balance;Decreased cognition;Decreased knowledge of use of DME;Decreased safety awareness;Decreased coordination;Impaired sensation       PT Treatment Interventions DME instruction;Gait training;Therapeutic exercise;Therapeutic activities;Functional mobility training;Balance training;Neuromuscular re-education;Cognitive remediation;Patient/family education    PT Goals (Current goals can be found in the Care Plan section)  Acute Rehab PT Goals Patient Stated Goal: none stated PT Goal Formulation: With patient Time For Goal Achievement: 03/22/23 Potential to Achieve Goals: Good    Frequency Min 1X/week     Co-evaluation               AM-PAC PT "6 Clicks" Mobility  Outcome Measure Help needed turning from your back to your side while in a flat bed without using bedrails?:  None Help needed moving from lying on your back to sitting on the side of a flat bed without using bedrails?: A Little Help needed moving to and from a bed to a chair (including a wheelchair)?: A Little Help needed standing up from a chair  using your arms (e.g., wheelchair or bedside chair)?: A Little Help needed to walk in hospital room?: A Little Help needed climbing 3-5 steps with a railing? : A Lot 6 Click Score: 18    End of Session Equipment Utilized During Treatment: Gait belt Activity Tolerance: Patient tolerated treatment well Patient left: with call bell/phone within reach;in chair;with chair alarm set Nurse Communication: Mobility status PT Visit Diagnosis: Unsteadiness on feet (R26.81);Other abnormalities of gait and mobility (R26.89);Other symptoms and signs involving the nervous system (R29.898);Ataxic gait (R26.0)    Time: 1610-9604 PT Time Calculation (min) (ACUTE ONLY): 40 min   Charges:   PT Evaluation $PT Eval Moderate Complexity: 1 Mod PT Treatments $Gait Training: 8-22 mins $Therapeutic Activity: 8-22 mins PT General Charges $$ ACUTE PT VISIT: 1 Visit         Shivam Mestas B. Beverely Risen PT, DPT Acute Rehabilitation Services Please use secure chat or  Call Office [phone]   [facility] 04/08/2023, 11:31 AM

## 2023-04-08 NOTE — Plan of Care (Signed)
Problem: Safety: Goal: Ability to remain free from injury will improve Outcome: Progressing   Problem: Skin Integrity: Goal: Risk for impaired skin integrity will decrease Outcome: Progressing

## 2023-04-08 NOTE — Progress Notes (Signed)
Inpatient Rehab Admissions Coordinator:   Per PT recommendations pt was screened for CIR by Estill Dooms, PT, DPT.  Pt was recently hospitalized end of January and CIR following at that time.  At time of d/c he was supervision for mobility and supervision/CGA for ADLs.  He is nearly at that level at this time.  We will not place a consult at this time as he can likely d/c directly home when medically ready.    Estill Dooms, PT, DPT Admissions Coordinator [phone] 04/08/23  1:08 PM

## 2023-04-08 NOTE — Progress Notes (Signed)
Report called to [facility] Oncology unit. Patient transferring to [facility] room [id number].  Spoke with Ava P. RN and handoff completed at this time.     04/08/23 1707  Hand-off documentation  Hand-off Given Given to Transfer Unit/facility  Report given to (Full Name) Ava P. RN

## 2023-04-08 NOTE — TOC CM/SW Note (Signed)
Transition of Care [facility]) - Inpatient Brief Assessment   Patient Details  Name: Oisin Yoakum MRN: [id number] Date of Birth: 01-17-1951  Transition of Care [facility]) CM/SW Contact:    Kermit Balo, RN Phone Number: 04/08/2023, 3:45 PM   Clinical Narrative:  The plan is to transfer him to [facility] long hospital to begin treatment with high-dose methotrexate.   Transition of Care Asessment: Insurance and Status: Insurance coverage has been reviewed Patient has primary care physician: Yes Home environment has been reviewed: home with spouse   Prior/Current Home Services: No current home services Social Drivers of Health Review: SDOH reviewed no interventions necessary Readmission risk has been reviewed: Yes Transition of care needs: no transition of care needs at this time

## 2023-04-08 NOTE — Evaluation (Signed)
Occupational Therapy Evaluation Patient Details Name: Tyler [name] MRN: [id number] DOB: [birth year] Today's Date: 04/08/2023   History of Present Illness   History of Present Illness: 73 yo male presents to [facility] on 2/10 with LUE and LE weakness, and change in mentation. Head CT of brain shows a recurrent tumor at the craniotomy site and increased size of recurrent tumor along with a left midline shift up to 6 mm. 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     Clinical Impressions Prior to admission patient was living in an apartment at entry level.  Spoke to the daughter and the patient was not displaying L side inattention and was not using an assistive device at home.  Symptoms had resolved after the craniotomy.  Pt admitted with  Intracranial metastasis.  Currently patient is mod to max assist with BADL's due to inattention to the left side and OT recommends patient will benefit from intensive inpatient follow-up therapy, >3 hours/day as patient was higher functioning at discharge from the last admission. Will continue to follow on acute to facilitate D/C  Planning.     If plan is discharge home, recommend the following:   A little help with walking and/or transfers;A lot of help with bathing/dressing/bathroom;Assistance with cooking/housework;Assistance with feeding;Assist for transportation;Help with stairs or ramp for entrance;Direct supervision/assist for medications management;Direct supervision/assist for financial management     Functional Status Assessment   Patient has had a recent decline in their functional status and demonstrates the ability to make significant improvements in function in a reasonable and predictable amount of time.     Equipment  Recommendations   Other (comment)     Recommendations for Other Services   Rehab consult     Precautions/Restrictions   Precautions Precautions: Fall Recall of Precautions/Restrictions: Impaired Restrictions Weight Bearing Restrictions Per Provider Order: No     Mobility Bed Mobility               General bed mobility comments: Pt in chair upon arrival    Transfers Overall transfer level: Needs assistance Equipment used: Rolling walker (2 wheels), 1 person hand held assist Transfers: Sit to/from Stand Sit to Stand: Min assist           General transfer comment: Needing mod verbal and tactile cues to place lue onto the walker and navigate walker - tending to hit the edge of the bed or any obstacal on the left      Balance Overall balance assessment: Needs assistance Sitting-balance support: Feet supported, No upper extremity supported (lue laying passively behind patient in sitting) Sitting balance-Leahy Scale: Fair Sitting balance - Comments: supervision                                   ADL either performed or assessed with clinical judgement   ADL Overall ADL's : Needs assistance/impaired Eating/Feeding: Moderate assistance   Grooming: Moderate assistance   Upper Body Bathing: Moderate assistance   Lower Body Bathing: Maximal assistance   Upper Body Dressing : Moderate assistance   Lower Body Dressing: Maximal assistance   Toilet Transfer: Moderate assistance   Toileting- Clothing Manipulation and Hygiene: Maximal assistance       Functional mobility during ADLs: Minimal assistance;Cueing for safety;Rolling walker (2  wheels) General ADL Comments: Due to left inattention the patient needs increased assist to perform ADLS - Also since he is L hand dominent     Vision Baseline Vision/History: 0 No visual deficits Ability to See in Adequate Light: 0 Adequate Vision Assessment?: Vision impaired- to be further tested in  functional context Additional Comments: Able to track to the L - took several attempts     Perception Perception: Impaired Preception Impairment Details: Inattention/Neglect (left)     Praxis         Pertinent Vitals/Pain Pain Assessment Pain Assessment: No/denies pain     Extremity/Trunk Assessment Upper Extremity Assessment Upper Extremity Assessment: Generalized weakness;Left hand dominant;LUE deficits/detail LUE Deficits / Details: Full AAROM with min increase in tone during activity LUE Sensation: decreased light touch   Lower Extremity Assessment Lower Extremity Assessment: Defer to PT evaluation RLE Deficits / Details: able to move with coordinated movement,strength assessed during ambulation to be functionally ~4/5, denied numbness/tingling LLE Deficits / Details: difficulty follow command for MMT, functionally 3+/5 but requires increased time to complete movement, fatigues quickly, difficulty coordinating with ambulation   Cervical / Trunk Assessment Cervical / Trunk Assessment: Normal Cervical / Trunk Exceptions: prior L4 compression fracture   Communication Communication Communication: No apparent difficulties Factors Affecting Communication: Non - English speaking, interpreter not available   Cognition Arousal: Alert Behavior During Therapy: WFL for tasks assessed/performed, Impulsive Cognition: No family/caregiver present to determine baseline                               Following commands: Impaired Following commands impaired: Follows one step commands with increased time     Cueing  General Comments          Exercises     Shoulder Instructions      Home Living Family/patient expects to be discharged to:: Private residence Living Arrangements: Spouse/significant other Available Help at Discharge: Family Type of Home: Apartment Home Access: Level entry Entrance Stairs-Number of Steps: none Entrance Stairs-Rails: Right;Left Home  Layout: One level     Bathroom Shower/Tub: Chief Strategy Officer: Standard Bathroom Accessibility: Yes How Accessible: Accessible via walker Home Equipment: Rolling Walker (2 wheels);BSC/3in1;Shower seat (Has a shower seat - needs clarification on type)   Additional Comments: Per patient they moved to a first floor apartment      Prior Functioning/Environment Prior Level of Function : Independent/Modified Independent             Mobility Comments: occasional RW use ADLs Comments: Independent to Mod I with occasional assistance from children    OT Problem List: Impaired balance (sitting and/or standing);Decreased coordination;Decreased safety awareness;Impaired sensation;Impaired tone;Impaired UE functional use   OT Treatment/Interventions: Self-care/ADL training;Therapeutic activities;Visual/perceptual remediation/compensation;Patient/family education;Balance training      OT Goals(Current goals can be found in the care plan section)   Acute Rehab OT Goals Patient Stated Goal: To go home and eat regular food OT Goal Formulation: With patient/family Time For Goal Achievement: 04/22/23 Potential to Achieve Goals: Good ADL Goals Pt Will Perform Eating: sitting;with supervision Pt Will Perform Grooming: with supervision;standing Pt Will Transfer to Toilet: with supervision;ambulating Pt Will Perform Toileting - Clothing Manipulation and hygiene: with supervision;sit to/from stand   OT Frequency:  Min 1X/week    Co-evaluation       OT goals addressed during session: ADL's and self-care      AM-PAC OT "6 Clicks" Daily Activity  Outcome Measure Help from another person eating meals?: A Little Help from another person taking care of personal grooming?: A Little Help from another person toileting, which includes using toliet, bedpan, or urinal?: A Lot Help from another person bathing (including washing, rinsing, drying)?: A Lot Help from another  person to put on and taking off regular upper body clothing?: A Lot Help from another person to put on and taking off regular lower body clothing?: A Lot 6 Click Score: 14   End of Session Equipment Utilized During Treatment: Gait belt;Rolling walker (2 wheels) Nurse Communication: Mobility status  Activity Tolerance: Patient tolerated treatment well Patient left: in chair;with call bell/phone within reach;with chair alarm set  OT Visit Diagnosis: Unsteadiness on feet (R26.81);Other abnormalities of gait and mobility (R26.89);Muscle weakness (generalized) (M62.81)                Time: 8413-2440 OT Time Calculation (min): 37 min Charges:  OT General Charges $OT Visit: 1 Visit OT Treatments $Self Care/Home Management : 8-22 mins  Hal Neer OTR/L   Malachi Bonds 04/08/2023, 1:18 PM

## 2023-04-08 NOTE — Progress Notes (Signed)
Subjective: The patient is alert and pleasant.  He is in no apparent distress.  Objective: Vital signs in last 24 hours: Temp:  [97.8 F (36.6 C)-98.8 F (37.1 C)] 98 F (36.7 C) (02/11 0422) Pulse Rate:  [58-95] 76 (02/11 0422) Resp:  [14-23] 17 (02/11 0422) BP: (129-182)/(78-120) 139/78 (02/11 0422) SpO2:  [92 %-100 %] 92 % (02/11 0422) Estimated body mass index is 28.98 kg/m as calculated from the following:   Height as of 03/28/23: 5\' 7"  (1.702 m).   Weight as of 03/28/23: 83.9 kg.   Intake/Output from previous day: 02/10 0701 - 02/11 0700 In: 120 [P.O.:120] Out: 775 [Urine:775] Intake/Output this shift: No intake/output data recorded.  Physical exam the patient is alert and oriented x 2, person and a hospital.  He is mildly left hemiparetic.  His craniotomy incision is well-healed.  Lab Results: Recent Labs    04/07/23 1016 04/08/23 0520  WBC 5.1 7.6  HGB 14.4 14.7  HCT 43.1 42.6  PLT 156 178   BMET Recent Labs    04/07/23 1016 04/08/23 0520  NA 142 141  K 3.9 4.0  CL 108 101  CO2 23 23  GLUCOSE 102* 129*  BUN 8 11  CREATININE 0.94 0.88  CALCIUM 9.0 9.3    Studies/Results: MR Brain W and Wo Contrast Result Date: 04/07/2023 CLINICAL DATA:  Provided history: Neuro deficit, persistent/recurrent, CNS neoplasm suspected. Diffuse large B-cell lymphoma. Left-sided weakness with midline shift on CT. EXAM: MRI HEAD WITHOUT AND WITH CONTRAST TECHNIQUE: Multiplanar, multiecho pulse sequences of the brain and surrounding structures were obtained without and with intravenous contrast. CONTRAST:  8mL GADAVIST GADOBUTROL 1 MMOL/ML IV SOLN COMPARISON:  Head CT 04/07/2023.  Brain MRI 03/07/2023 neck FINDINGS: Intermittently motion degraded examination. Most notably, the axial T1 post-contrast sequence and coronal T2 sequence are moderately motion degraded. Within this limitation findings are as follows. Brain: Since the prior MRI 06/05/2023, there has been right parietal  craniotomy for biopsy/debulking of an enhancing right parietal lobe tumor. Non-acute blood products at the operative site. The residual enhancing tumor measures 5.4 x 3.6 x 4.4 cm (previously 4.4 x 3.0 x 3.3 when remeasured on prior). Prominent surrounding edema slightly increased. 6 mm leftward midline shift has also increased (previously 4 mm). Persistent effacement of portions of the right lateral ventricle. Persistent asymmetric prominence of the right lateral ventricle temporal horn consistent with ventricular entrapment. Trace postoperative subdural collection along the anterior right cerebral hemisphere. Mild-to-moderate multifocal T2 FLAIR hyperintense signal abnormality elsewhere within the cerebral white matter, nonspecific but compatible with chronic small vessel ischemic disease. There is no acute infarct. Vascular: Maintained flow voids within the proximal large arterial vessels. Skull and upper cervical spine: Right parietal cranioplasty. No focal worrisome marrow lesion. Sinuses/orbits: No orbital mass or acute orbital finding.Mild mucosal thickening within the left maxillary sinus. IMPRESSION: 1. Motion degraded examination. 2. Since the prior brain MRI 03/07/2023, there has been right parietal craniotomy for biopsy/debulking of an enhancing right parietal lobe tumor. The residual tumor has increased in size, now measuring 5.4 x 3.6 x 4.4 cm (previously 4.4 x 3.0 x 3.3 cm). Prominent surrounding edema has slightly increased. 6 mm leftward midline shift has also increased. Unchanged asymmetric prominence of the right lateral ventricle temporal horn consistent with ventricular entrapment. Electronically Signed   By: Tyler [name] D.O.   On: 04/07/2023 14:01   CT Head Wo Contrast Result Date: 04/07/2023 CLINICAL DATA:  Facial paralysis/weakness (CN 7) EXAM: CT HEAD WITHOUT CONTRAST TECHNIQUE:  Contiguous axial images were obtained from the base of the skull through the vertex without intravenous  contrast. RADIATION DOSE REDUCTION: This exam was performed according to the departmental dose-optimization program which includes automated exposure control, adjustment of the mA and/or kV according to patient size and/or use of iterative reconstruction technique. COMPARISON:  Head CT 03/06/2023, brain MR 03/07/2023 FINDINGS: Brain: No hemorrhage. No hydrocephalus. No extra-axial fluid collection postsurgical changes from a right parietal craniotomy with interval increase in size of the residual/recurrent tumor at the craniotomy site, now measuring up to 4.7 x 2.7 cm, previously 3.2 x 2.5 cm, when measured in a similar orientation. There is increased leftward midline shift, now measuring up to 6 mm, previously 3 mm. Unchanged disproportionate enlargement of the right temporal horn, worrisome for ventricular entrapment. Vascular: No hyperdense vessel or unexpected calcification. Skull: Normal. Negative for fracture or focal lesion. Status post parietal craniotomy Sinuses/Orbits: No middle ear or mastoid effusion. Mucosal thickening in the bilateral maxillary sinuses, right-greater-than-left orbits are unremarkable. Other: None. IMPRESSION: 1. Postsurgical changes from a right parietal craniotomy with interval increase in size of the residual/recurrent tumor at the craniotomy site, now measuring up to 4.7 x 2.7 cm, previously 3.2 x 2.5 cm. There is increased leftward midline shift, now measuring up to 6 mm, previously 3 mm. Recommend further evaluation with brain MRI. 2. Unchanged asymmetric enlargement of the right temporal horn, compatible with ventricular entrapment Electronically Signed   By: Tyler [name] M.D.   On: 04/07/2023 10:57    Assessment/Plan: Recurrent high-grade right parietal lymphoma: The plan is to transfer him to [facility] hospital to begin treatment with high-dose methotrexate.  LOS: 1 day     Tyler [name] 04/08/2023, 8:02 AM     Patient ID: Tyler [name], male   DOB:  [birth year], 73 y.o.   MRN: [id number]

## 2023-04-08 NOTE — Plan of Care (Signed)
  Problem: Safety: Goal: Ability to remain free from injury will improve Outcome: Progressing  nterventions Assess risk factors for imparied skin integrity and/or pressure injuries Initiate pressure injury prevention interventions if Braden Score </= 18 Assess/Monitor skin integrity, appearance and/or temperature Provide skin car

## 2023-04-08 NOTE — Evaluation (Signed)
Clinical/Bedside Swallow Evaluation Patient Details  Name: Tyler [name] MRN: [id number] Date of Birth: 04-Feb-1951  Today's Date: 04/08/2023 Time: SLP Start Time (ACUTE ONLY): 1159 SLP Stop Time (ACUTE ONLY): 1202 SLP Time Calculation (min) (ACUTE ONLY): 3 min  Past Medical History:  Past Medical History:  Diagnosis Date   Cancer (HCC)    prostate   Follicular lymphoma (HCC) 2024   History of pulmonary embolism    Hypertension    Myelodysplastic syndrome (HCC)    PAF (paroxysmal atrial fibrillation) (HCC)    Recurrent UTI    Past Surgical History:  Past Surgical History:  Procedure Laterality Date   APPLICATION OF CRANIAL NAVIGATION Right 03/10/2023   Procedure: APPLICATION OF CRANIAL NAVIGATION;  Surgeon: Tressie Stalker, MD;  Location: [facility] OR;  Service: Neurosurgery;  Laterality: Right;   BIOPSY  08/21/2022   Procedure: BIOPSY;  Surgeon: Beverley Fiedler, MD;  Location[facility] ENDOSCOPY;  Service: Gastroenterology;;   CRANIOTOMY Right 03/10/2023   Procedure: PARIETAL CRANIOTOMY;  Surgeon: Tressie Stalker, MD;  Location: [facility] OR;  Service: Neurosurgery;  Laterality: Right;   ESOPHAGOGASTRODUODENOSCOPY (EGD) WITH PROPOFOL N/A 08/21/2022   Procedure: ESOPHAGOGASTRODUODENOSCOPY (EGD) WITH PROPOFOL;  Surgeon: Beverley Fiedler, MD;  Location: [facility] ENDOSCOPY;  Service: Gastroenterology;  Laterality: N/A;   IR IMAGING GUIDED PORT INSERTION  08/26/2022   LYMPHADENECTOMY Bilateral 09/01/2019   Procedure: LYMPHADENECTOMY;  Surgeon: Sebastian Ache, MD;  Location: [facility] ORS;  Service: Urology;  Laterality: Bilateral;   ROBOT ASSISTED LAPAROSCOPIC RADICAL PROSTATECTOMY N/A 09/01/2019   Procedure: XI ROBOTIC ASSISTED LAPAROSCOPIC RADICAL PROSTATECTOMY;  Surgeon: Sebastian Ache, MD;  Location: [facility] ORS;  Service: Urology;  Laterality: N/A;  3 HRS   HPI:  73 yo male presents to [facility] on 2/10 with LUE and LE weakness, and change in mentation. Head CT of brain shows a recurrent tumor at the craniotomy site and increased  size of recurrent tumor along with a left midline shift up to 6 mm. 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 Pt's swallowing was evaluated when he was admitted in June of 2024 - he was D/Cd on a dysphagia 3 diet with thin liquids with no f/u recommended.    Assessment / Plan / Recommendation  Clinical Impression  Pt participated in clinical swallowing assessment. Oral mechanism exam reveals normal dentition, mild tongue deviation to left upon extension.  Pt presents with normal swallowing with adequate mastication, the appearance of a swift swallow, no s/s of aspiration, no oral residue post swallow. Recommend pt's diet be advanced to regular solids with thin liquids; meds with water. Pt required assistance with set-up and self-feeding due to LUE weakness and inattention.  No further SLP f/u warranted for swallowing. If cognitive/linguistic assessment is warranted, please order. Thank you.   SLP Visit Diagnosis: Dysphagia, unspecified (R13.10)           Diet Recommendation   Thin;Age appropriate regular  Medication Administration: Whole meds with liquid    Other  Recommendations Oral Care Recommendations: Oral care BID    Recommendations for follow up therapy are one component of a multi-disciplinary discharge planning process, led by the attending physician.  Recommendations may be updated based on patient status, additional functional criteria and insurance authorization.  Follow up Recommendations No SLP follow up        Swallow Study  General Date of Onset: 04/07/23 HPI: 73 yo male presents to [facility] on 2/10 with LUE and LE weakness, and change in mentation. Head CT of brain shows a recurrent tumor at the craniotomy site and increased size of recurrent tumor  along with a left midline shift up to 6 mm. 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 Pt's swallowing was evaluated when he was admitted in June of 2024 - he was D/Cd on a dysphagia 3 diet with thin liquids with no f/u recommended. Type of Study: Bedside Swallow Evaluation Previous Swallow Assessment: June of 2024 Diet Prior to this Study: Dysphagia 1 (pureed);Thin liquids (Level 0) Temperature Spikes Noted: No Respiratory Status: Room air History of Recent Intubation: No Behavior/Cognition: Alert;Pleasant mood;Cooperative Oral Cavity Assessment: Within Functional Limits Oral Care Completed by SLP: No Oral Cavity - Dentition: Adequate natural dentition Vision: Functional for self-feeding Self-Feeding Abilities: Needs set up;Needs assist Patient Positioning: Upright in chair Baseline Vocal Quality: Normal Volitional Cough: Strong Volitional Swallow: Able to elicit    Oral/Motor/Sensory Function Overall Oral Motor/Sensory Function: Mild impairment Lingual Symmetry: Abnormal symmetry left;Suspected CN XII (hypoglossal) dysfunction   Ice Chips Ice chips: Not tested   Thin Liquid Thin Liquid: Within functional limits    Nectar Thick Nectar Thick Liquid: Not tested   Honey Thick Honey Thick Liquid: Not tested   Puree Puree: Within functional limits   Solid     Solid: Within functional limits      [name] 04/08/2023,12:20 PM  Marchelle Folks L. [name], MA CCC/SLP Clinical Specialist - Acute Care SLP Acute Rehabilitation Services Office number [phone]

## 2023-04-08 NOTE — Hospital Course (Signed)
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  Neurosurgery was consulted and notes significant recurrence of high-grade lymphoma with recommendation to start chemo and/or rads expeditiously.  Oncology is consulted.

## 2023-04-08 NOTE — Progress Notes (Signed)
Phone call placed to daughter Irene/ [phone]. (Mobile)  Update provided that her father is transferring to [facility] and that he has been picked up by [facility] transport.

## 2023-04-08 NOTE — Progress Notes (Signed)
   04/08/23 0426  Provider Notification  Provider Name/Title Dr Antionette Char  Date Provider Notified 04/08/23  Time Provider Notified [phone]  Method of Notification Page  Notification Reason Other (Comment) (Pt had another round of Atrial Tachycardia, rate146, pt denies any discomfort)  Provider response No new orders  Date of Provider Response 04/08/23  Time of Provider Response [phone]

## 2023-04-08 NOTE — Progress Notes (Signed)
Medical Oncology Note  Still awaiting transfer to [facility] to initiate HD Methotrexate with leucovorin rescue. On PO Sodium bicarb and Bicarb drip to alkalinize urine. -awaiting rpt UA for urine pH assessment. Will need urine ph measurement by dip stick to get to urinePh>=7 -anticipate will need to st6art rx tomorrow given delays with transfer thus far.  Wyvonnia Lora MD

## 2023-04-09 ENCOUNTER — Ambulatory Visit (HOSPITAL_COMMUNITY): Payer: 59

## 2023-04-09 DIAGNOSIS — I2699 Other pulmonary embolism without acute cor pulmonale: Secondary | ICD-10-CM | POA: Diagnosis not present

## 2023-04-09 DIAGNOSIS — D496 Neoplasm of unspecified behavior of brain: Secondary | ICD-10-CM

## 2023-04-09 DIAGNOSIS — C8330 Diffuse large B-cell lymphoma, unspecified site: Secondary | ICD-10-CM

## 2023-04-09 DIAGNOSIS — R29898 Other symptoms and signs involving the musculoskeletal system: Secondary | ICD-10-CM | POA: Diagnosis not present

## 2023-04-09 DIAGNOSIS — C8339 Primary central nervous system lymphoma: Secondary | ICD-10-CM | POA: Diagnosis not present

## 2023-04-09 LAB — CBC WITH DIFFERENTIAL/PLATELET
Abs Immature Granulocytes: 0.1 10*3/uL — ABNORMAL HIGH (ref 0.00–0.07)
Basophils Absolute: 0 10*3/uL (ref 0.0–0.1)
Basophils Relative: 0 %
Eosinophils Absolute: 0 10*3/uL (ref 0.0–0.5)
Eosinophils Relative: 0 %
HCT: 40.9 % (ref 39.0–52.0)
Hemoglobin: 13.5 g/dL (ref 13.0–17.0)
Immature Granulocytes: 1 %
Lymphocytes Relative: 6 %
Lymphs Abs: 0.6 10*3/uL — ABNORMAL LOW (ref 0.7–4.0)
MCH: 29.9 pg (ref 26.0–34.0)
MCHC: 33 g/dL (ref 30.0–36.0)
MCV: 90.5 fL (ref 80.0–100.0)
Monocytes Absolute: 0.6 10*3/uL (ref 0.1–1.0)
Monocytes Relative: 6 %
Neutro Abs: 8.2 10*3/uL — ABNORMAL HIGH (ref 1.7–7.7)
Neutrophils Relative %: 87 %
Platelets: 165 10*3/uL (ref 150–400)
RBC: 4.52 MIL/uL (ref 4.22–5.81)
RDW: 11.9 % (ref 11.5–15.5)
WBC: 9.4 10*3/uL (ref 4.0–10.5)
nRBC: 0 % (ref 0.0–0.2)

## 2023-04-09 LAB — HEMOGLOBIN A1C
Hgb A1c MFr Bld: 5.2 % (ref 4.8–5.6)
Mean Plasma Glucose: 102.54 mg/dL

## 2023-04-09 LAB — URINALYSIS, COMPLETE (UACMP) WITH MICROSCOPIC
Bacteria, UA: NONE SEEN
Bilirubin Urine: NEGATIVE
Glucose, UA: NEGATIVE mg/dL
Hgb urine dipstick: NEGATIVE
Ketones, ur: NEGATIVE mg/dL
Leukocytes,Ua: NEGATIVE
Nitrite: NEGATIVE
Protein, ur: NEGATIVE mg/dL
Specific Gravity, Urine: 1.008 (ref 1.005–1.030)
pH: 9 — ABNORMAL HIGH (ref 5.0–8.0)

## 2023-04-09 LAB — GLUCOSE, CAPILLARY
Glucose-Capillary: 117 mg/dL — ABNORMAL HIGH (ref 70–99)
Glucose-Capillary: 192 mg/dL — ABNORMAL HIGH (ref 70–99)
Glucose-Capillary: 143 mg/dL — ABNORMAL HIGH (ref 70–99)

## 2023-04-09 MED ORDER — SODIUM CHLORIDE 0.9 % IV SOLN
Freq: Once | INTRAVENOUS | Status: AC
  Administered 2023-04-09: 20 mg via INTRAVENOUS
  Filled 2023-04-09: qty 8

## 2023-04-09 MED ORDER — SODIUM CHLORIDE 0.9% FLUSH: 10.0000 mL | INTRAVENOUS | Status: DC | PRN

## 2023-04-09 MED ORDER — INSULIN ASPART 100 UNIT/ML IJ SOLN
0.0000 [IU] | Freq: Three times a day (TID) | INTRAMUSCULAR | Status: DC
  Administered 2023-04-09 – 2023-04-14 (×6): 1 [IU] via SUBCUTANEOUS

## 2023-04-09 MED ORDER — CHLORHEXIDINE GLUCONATE CLOTH 2 % EX PADS
6.0000 | MEDICATED_PAD | Freq: Every day | CUTANEOUS | Status: DC
  Administered 2023-04-09 – 2023-04-13 (×5): 6 via TOPICAL

## 2023-04-09 MED ORDER — SODIUM CHLORIDE 0.9% FLUSH
10.0000 mL | Freq: Two times a day (BID) | INTRAVENOUS | Status: DC
Start: 2023-04-09 — End: 2023-04-14
  Administered 2023-04-09 – 2023-04-11 (×5): 10 mL
  Administered 2023-04-12: 20 mL
  Administered 2023-04-12 – 2023-04-14 (×4): 10 mL

## 2023-04-09 MED ORDER — GABAPENTIN 100 MG PO CAPS: 100.0000 mg | ORAL_CAPSULE | Freq: Three times a day (TID) | ORAL | Status: DC

## 2023-04-09 MED ORDER — ENOXAPARIN SODIUM 30 MG/0.3ML IJ SOSY
30.0000 mg | PREFILLED_SYRINGE | INTRAMUSCULAR | Status: DC
  Administered 2023-04-09 – 2023-04-13 (×5): 30 mg via SUBCUTANEOUS
  Filled 2023-04-09 (×6): qty 0.3

## 2023-04-09 MED ORDER — DEXAMETHASONE SODIUM PHOSPHATE 10 MG/ML IJ SOLN
6.0000 mg | Freq: Two times a day (BID) | INTRAMUSCULAR | Status: DC
  Administered 2023-04-09 – 2023-04-14 (×10): 6 mg via INTRAVENOUS
  Filled 2023-04-09 (×10): qty 1

## 2023-04-09 MED ORDER — GABAPENTIN 100 MG PO CAPS
100.0000 mg | ORAL_CAPSULE | Freq: Three times a day (TID) | ORAL | Status: DC
  Administered 2023-04-09 – 2023-04-10 (×4): 100 mg via ORAL
  Filled 2023-04-09 (×4): qty 1

## 2023-04-09 MED ORDER — METHOTREXATE SODIUM (PF) CHEMO INJECTION 1 GM/40ML
3.5000 g/m2 | Freq: Once | INTRAVENOUS | Status: AC
  Administered 2023-04-09: 6.965 g via INTRAVENOUS
  Filled 2023-04-09: qty 278.6

## 2023-04-09 NOTE — Progress Notes (Addendum)
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 Neurosurgery was consulted and notes significant recurrence of high-grade lymphoma with recommendation to start chemo and/or rads expeditiously. Oncology is consulted.    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  Patient preferred to proceed with more aggressive approach with standard treatment involving chemotherapy Presented with worsening left-sided weakness  CT on 2/10 with interval increase in size of tumor at the craniotomy site MRI on 2/10 with increase in size of tumor with slightly increased surrounding edema Neurosurgery was consulted and recommends expedited chemo/rads therapy He was started on Decadron 6 mg q6h Oncology was consulted, patient was transferred to [facility] hospital for initiation of high-dose methotrexate -Sodium bicarb IV and p.o. started, need to achieve urine pH more than 7 I -Urine pH 9.0 this morning  H/o dysphagia This was an issue during his last hospitalization SLP consulted for diet recommendations (previously on dysphagia 3 diet)   Altered mental status Likely due to brain mets Continue supportive care and treatment plan as recommended by oncology.    A-fib No AC due to high bleeding risk Rate controlled with Toprol XL  History of bilateral pulmonary embolism Not on AC at this time He was hospitalized for a prolonged period of time last June with severe pancytopenia and tumor lysis syndrome; he was transfused about 15 units PRBC + platelets   Hypertension Continue Toprol XL   Prostate cancer Diagnosed 2021   Hep B antibody positive, chronic On tenofovir for hep B reactivation suppression in context of Rituxan use Plan is to continue for 6 months after his last chemotherapy. Follow ID closely Tenofovir may need to be held while on methotrexate - will defer to oncology   Gout Continue allopurinol prn   Neuropathy pain -Complain of left upper extremity neuropathic pain -Start gabapentin 100 mg p.o. 3 times daily, as per wife he did not have allergic reaction with gabapentin as listed in chart   Medications     allopurinol  100 mg Oral BID   B-complex with vitamin C  1 tablet Oral Daily   cholecalciferol  1,000 Units Oral Daily   dexamethasone (DECADRON) injection  6 mg Intravenous Q6H   folic acid  1 mg Oral Daily   metoprolol succinate  25 mg Oral Daily   sodium bicarbonate  650 mg Oral TID   tenofovir alafenamide  25 mg Oral Daily     Data Reviewed:   CBG:  No results for input(s): "GLUCAP" in the last 168 hours.  SpO2: 98 %    Vitals:   04/08/23 2131 04/09/23 0143 04/09/23 0529 04/09/23 1027  BP: (!) 163/85 (!) 160/94 (!) 147/94 (!) 151/92  Pulse: 67 66 64 79  Resp: 14 14 14    Temp: 98.4 F (36.9 C) 98.5 F (  36.9 C) 98.4 F (36.9 C)   TempSrc: Oral Oral Oral   SpO2: 97% 98% 94% 98%      Data Reviewed:  Basic Metabolic Panel: Recent Labs  Lab 04/07/23 1016 04/08/23 0520  NA 142 141  K 3.9 4.0  CL 108 101  CO2 23 23  GLUCOSE 102* 129*  BUN 8 11  CREATININE 0.94 0.88  CALCIUM 9.0 9.3  MG  --  1.7    CBC: Recent Labs  Lab 04/07/23 1016 04/08/23 0520  WBC 5.1 7.6  NEUTROABS 2.4   --   HGB 14.4 14.7  HCT 43.1 42.6  MCV 90.4 88.6  PLT 156 178    LFT Recent Labs  Lab 04/07/23 1016  AST 40  ALT 59*  ALKPHOS 115  BILITOT 0.4  PROT 6.2*  ALBUMIN 3.6     Antibiotics: Anti-infectives (From admission, onward)    Start     Dose/Rate Route Frequency Ordered Stop   04/07/23 1500  tenofovir alafenamide (VEMLIDY) tablet 25 mg        25 mg Oral Daily 04/07/23 1222          DVT prophylaxis: Lovenox  Code Status: Full code  Family Communication: No family at bedside   CONSULTS oncology, neurosurgery   Subjective   Complains of burning pain in the left upper extremity   Objective    Physical Examination:   General-appears in no acute distress Heart-S1-S2, regular, no murmur auscultated Lungs-clear to auscultation bilaterally, no wheezing or crackles auscultated Abdomen-soft, nontender, no organomegaly Extremities-no edema in the lower extremities Neuro-alert, oriented x3, moving all extremities   Status is: Inpatient:          Meredeth Ide   [facility] If 7PM-7AM, please contact night-coverage at [email or web address], Office  [phone]   04/09/2023, 11:43 AM  LOS: 2 days

## 2023-04-09 NOTE — Progress Notes (Addendum)
Tyler [name]   DOB:08-26-1950   UE#:[id number]      ASSESSMENT & PLAN:  1.  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  Patient preferred to proceed with more aggressive approach with standard treatment involving chemotherapy. - Status postcraniotomy with gross total resection on 03/10/2023. - CT of head 04/07/2023 showed interval increase in size of tumor at the craniotomy site. - MRI of head 04/07/2023 increase in size of tumor with slightly increased surrounding edema. - Patient transferred to [facility] now in order to start high-dose methotrexate. - Sodium bicarb IV and p.o. started.  Need to achieve urine pH greater than 7.  Last urine pH done last night was 6.  Repeat this morning.   - Medical oncology/Dr. Candise Che has been following closely   2.  Altered mental status -Improving.  Alert and oriented today. - Likely due to brain mets - Continue supportive care and treatment plan as recommended by oncology.   3.  Elevated blood glucose  - Monitor blood sugar levels closely. -Primary team following    4.  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  A-fib - Status post Eliquis.  Discontinued due to high risk for bleed. - Follow closely with cardiology   6.  Prostate cancer - Diagnosed 2021   7.  Hep B antibody positive, chronic - On tenofovir for hep B reactivation suppression in context of Rituxan use.  Plan is to continue for 6 months after his last chemotherapy. - Follow ID closely    Code Status Full   Subjective:  Patient seen awake alert and oriented laying supine in bed.  Patient's daughter at bedside.  Patient wondering why another  request for UA today, okay with explanation given.  Reports intermittent severe left-sided pain, describes as "burning" with severity of 10/10.  Noted IV sodium bicarb continues to infuse well.  No other acute distress is noted.  Objective:  Vitals:   04/09/23 0143 04/09/23 0529  BP: (!) 160/94 (!) 147/94  Pulse: 66 64  Resp: 14 14  Temp: 98.5 F (36.9 C) 98.4 F (36.9 C)  SpO2: 98% 94%     Intake/Output Summary (Last 24 hours) at 04/09/2023 1028 Last data filed at 04/09/2023 0920 Gross per 24 hour  Intake 2813.54 ml  Output 725 ml  Net 2088.54 ml     REVIEW OF SYSTEMS:   Constitutional: +left sided pain, Denies fevers, chills or abnormal night sweats Eyes: Denies blurriness of vision, double vision or watery eyes Ears, nose, mouth, throat, and face: Denies mucositis or sore throat Respiratory: Denies cough, dyspnea or wheezes Cardiovascular: Denies palpitation, chest discomfort or lower extremity swelling Gastrointestinal:  Denies nausea, heartburn or change in bowel habits Skin: Denies abnormal skin rashes Lymphatics: Denies new lymphadenopathy or easy bruising Neurological: Denies numbness, tingling or new weaknesses Behavioral/Psych: Mood is stable, no new changes  All other systems were reviewed with the patient and are negative.  PHYSICAL EXAMINATION: ECOG PERFORMANCE STATUS: 2 - Symptomatic, <50% confined to bed  Vitals:   04/09/23 0143 04/09/23 0529  BP: (!) 160/94 (!) 147/94  Pulse: 66 64  Resp: 14 14  Temp: 98.5 F (36.9 C) 98.4 F (36.9 C)  SpO2: 98% 94%   There were no vitals filed for this visit.  GENERAL: alert, no distress and comfortable SKIN: skin color, texture, turgor are normal, no rashes or significant lesions EYES: normal, conjunctiva are pink and non-injected, sclera clear OROPHARYNX: no exudate, no erythema and lips, buccal mucosa, and tongue normal  NECK: supple, thyroid normal size, non-tender, without nodularity LYMPH: no palpable  lymphadenopathy in the cervical, axillary or inguinal LUNGS: clear to auscultation and percussion with normal breathing effort HEART: regular rate & rhythm and no murmurs and no lower extremity edema ABDOMEN: abdomen soft, non-tender and normal bowel sounds MUSCULOSKELETAL: no cyanosis of digits and no clubbing  PSYCH: alert & oriented x 3 with fluent speech NEURO: no focal motor/sensory deficits   All questions were answered. The patient knows to call the clinic with any problems, questions or concerns.   The total time spent in the appointment was 55 minutes encounter with patient including review of chart and various tests results, discussions about plan of care and coordination of care plan  Dawson Bills, NP 04/09/2023 10:28 AM    Labs Reviewed:  Lab Results  Component Value Date   WBC 7.6 04/08/2023   HGB 14.7 04/08/2023   HCT 42.6 04/08/2023   MCV 88.6 04/08/2023   PLT 178 04/08/2023   Recent Labs    08/11/22 0316 08/11/22 1544 08/12/22 0515 08/12/22 1400 08/20/22 0302 08/21/22 0334 01/15/23 1300 01/28/23 0840 03/06/23 2020 03/28/23 1303 04/07/23 1016 04/08/23 0520  NA 134*   < > 137   < > 137   < > 141 145   < > 140 142 141  K 3.7   < > 3.3*   < > 4.0   < > 3.6 3.6   < > 3.8 3.9 4.0  CL 99   < > 101   < > 102   < > 107 107   < > 106 108 101  CO2 25   < > 28   < > 24   < > 30 26   < > 29 23 23   GLUCOSE 185*   < > 114*   < > 78   < > 118* 72   < > 114* 102* 129*  BUN 52*   < > 41*   < > 45*   < > 12 15   < > 12 8 11   CREATININE 1.58*   < > 1.44*   < > 3.38*   < > 0.84 0.99   < > 0.82 0.94 0.88  CALCIUM 7.4*   < > 7.5*   < > 7.2*   < > 9.1 9.0   < > 8.9 9.0 9.3  GFRNONAA 47*   < > 52*   < > 19*   < > >60  --    < > >60 >60 >60  PROT 4.6*  --  4.2*  --  3.9*   < > 6.6 6.4  --  6.5 6.2*  --   ALBUMIN 1.8*  1.7*   < > 1.7*  1.6*   < > 1.7*   < > 4.2  --   --  4.1 3.6  --   AST 33  --  31  --  25   < > 38 33  --  37 40  --   ALT 31  --  31  --  18   < > 46* 40   --  65* 59*  --  ALKPHOS 84  --  79  --  111   < > 100  --   --  125 115  --   BILITOT 3.2*  --  2.5*  --  0.8   < > 0.5 0.6  --  0.5 0.4  --   BILIDIR 1.7*  --  1.3*  --  0.2  --   --   --   --   --   --   --   IBILI 1.5*  --  1.2*  --  0.6  --   --   --   --   --   --   --    < > = values in this interval not displayed.    Studies Reviewed:  MR Brain W and Wo Contrast Result Date: 04/07/2023 CLINICAL DATA:  Provided history: Neuro deficit, persistent/recurrent, CNS neoplasm suspected. Diffuse large B-cell lymphoma. Left-sided weakness with midline shift on CT. EXAM: MRI HEAD WITHOUT AND WITH CONTRAST TECHNIQUE: Multiplanar, multiecho pulse sequences of the brain and surrounding structures were obtained without and with intravenous contrast. CONTRAST:  8mL GADAVIST GADOBUTROL 1 MMOL/ML IV SOLN COMPARISON:  Head CT 04/07/2023.  Brain MRI 03/07/2023 neck FINDINGS: Intermittently motion degraded examination. Most notably, the axial T1 post-contrast sequence and coronal T2 sequence are moderately motion degraded. Within this limitation findings are as follows. Brain: Since the prior MRI 06/05/2023, there has been right parietal craniotomy for biopsy/debulking of an enhancing right parietal lobe tumor. Non-acute blood products at the operative site. The residual enhancing tumor measures 5.4 x 3.6 x 4.4 cm (previously 4.4 x 3.0 x 3.3 when remeasured on prior). Prominent surrounding edema slightly increased. 6 mm leftward midline shift has also increased (previously 4 mm). Persistent effacement of portions of the right lateral ventricle. Persistent asymmetric prominence of the right lateral ventricle temporal horn consistent with ventricular entrapment. Trace postoperative subdural collection along the anterior right cerebral hemisphere. Mild-to-moderate multifocal T2 FLAIR hyperintense signal abnormality elsewhere within the cerebral white matter, nonspecific but compatible with chronic small vessel ischemic  disease. There is no acute infarct. Vascular: Maintained flow voids within the proximal large arterial vessels. Skull and upper cervical spine: Right parietal cranioplasty. No focal worrisome marrow lesion. Sinuses/orbits: No orbital mass or acute orbital finding.Mild mucosal thickening within the left maxillary sinus. IMPRESSION: 1. Motion degraded examination. 2. Since the prior brain MRI 03/07/2023, there has been right parietal craniotomy for biopsy/debulking of an enhancing right parietal lobe tumor. The residual tumor has increased in size, now measuring 5.4 x 3.6 x 4.4 cm (previously 4.4 x 3.0 x 3.3 cm). Prominent surrounding edema has slightly increased. 6 mm leftward midline shift has also increased. Unchanged asymmetric prominence of the right lateral ventricle temporal horn consistent with ventricular entrapment. Electronically Signed   By: Jackey Loge D.O.   On: 04/07/2023 14:01   CT Head Wo Contrast Result Date: 04/07/2023 CLINICAL DATA:  Facial paralysis/weakness (CN 7) EXAM: CT HEAD WITHOUT CONTRAST TECHNIQUE: Contiguous axial images were obtained from the base of the skull through the vertex without intravenous contrast. RADIATION DOSE REDUCTION: This exam was performed according to the departmental dose-optimization program which includes automated exposure control, adjustment of the mA and/or kV according to patient size and/or use of iterative reconstruction technique. COMPARISON:  Head CT 03/06/2023, brain MR 03/07/2023 FINDINGS: Brain: No hemorrhage. No hydrocephalus. No extra-axial fluid collection postsurgical changes from a right parietal craniotomy with interval increase in size of the residual/recurrent tumor at the craniotomy site,  now measuring up to 4.7 x 2.7 cm, previously 3.2 x 2.5 cm, when measured in a similar orientation. There is increased leftward midline shift, now measuring up to 6 mm, previously 3 mm. Unchanged disproportionate enlargement of the right temporal horn,  worrisome for ventricular entrapment. Vascular: No hyperdense vessel or unexpected calcification. Skull: Normal. Negative for fracture or focal lesion. Status post parietal craniotomy Sinuses/Orbits: No middle ear or mastoid effusion. Mucosal thickening in the bilateral maxillary sinuses, right-greater-than-left orbits are unremarkable. Other: None. IMPRESSION: 1. Postsurgical changes from a right parietal craniotomy with interval increase in size of the residual/recurrent tumor at the craniotomy site, now measuring up to 4.7 x 2.7 cm, previously 3.2 x 2.5 cm. There is increased leftward midline shift, now measuring up to 6 mm, previously 3 mm. Recommend further evaluation with brain MRI. 2. Unchanged asymmetric enlargement of the right temporal horn, compatible with ventricular entrapment Electronically Signed   By: Lorenza Cambridge M.D.   On: 04/07/2023 10:57   ADDENDUM  .Patient was Personally and independently interviewed, examined and relevant elements of the history of present illness were reviewed in details and an assessment and plan was created. All elements of the patient's history of present illness , assessment and plan were discussed in details with Hinton Dyer NP. The above documentation reflects our combined findings assessment and plan.   Urine ph now 9 and IV HD methotrexate was started. Headache resolved with steroids. Some left sided painful paresthesias. Infrequent and intermitent. Will reduce bicarb drip to 143ml/h and further wean down as possible Daily methotrexate levels and leucovorin rescue till levels<0.1 Reduce Dexamethasone to 6mg  IV q12h after starting Methotrexate today. Will consider adding IT Rituxan from C2 Neuro checks Catheter for IO monitoring Daily UA dipstick for pH assessment. Low dose lovenox for VTE prophylaxis -- high risk wth brain lymphoma due to release of large amounts of Tissue factor. SCD's as well. Oncology will continue to follow.  Wyvonnia Lora MD  MS

## 2023-04-09 NOTE — Progress Notes (Signed)
Patient received His Methotrexate infusion this afternoon. He tolerated treatment well. He was stable through out treatment with no issues.

## 2023-04-09 NOTE — Plan of Care (Signed)
Problem: Education: Goal: Knowledge of disease or condition will improve Outcome: Progressing Goal: Knowledge of secondary prevention will improve (MUST DOCUMENT ALL) Outcome: Progressing Goal: Knowledge of patient specific risk factors will improve (DELETE if not current risk factor) Outcome: Progressing

## 2023-04-09 NOTE — Plan of Care (Signed)
Problem: Education: Goal: Knowledge of disease or condition will improve Outcome: Progressing Goal: Knowledge of secondary prevention will improve (MUST DOCUMENT ALL) Outcome: Progressing Goal: Knowledge of patient specific risk factors will improve (DELETE if not current risk factor) Outcome: Progressing   Problem: Ischemic Stroke/TIA Tissue Perfusion: Goal: Complications of ischemic stroke/TIA will be minimized Outcome: Progressing   Problem: Coping: Goal: Will verbalize positive feelings about self Outcome: Progressing Goal: Will identify appropriate support needs Outcome: Progressing   Problem: Health Behavior/Discharge Planning: Goal: Ability to manage health-related needs will improve Outcome: Progressing Goal: Goals will be collaboratively established with patient/family Outcome: Progressing   Problem: Self-Care: Goal: Ability to participate in self-care as condition permits will improve Outcome: Progressing Goal: Verbalization of feelings and concerns over difficulty with self-care will improve Outcome: Progressing Goal: Ability to communicate needs accurately will improve Outcome: Progressing   Problem: Nutrition: Goal: Risk of aspiration will decrease Outcome: Progressing Goal: Dietary intake will improve Outcome: Progressing   Problem: Education: Goal: Knowledge of General Education information will improve Description: Including pain rating scale, medication(s)/side effects and non-pharmacologic comfort measures Outcome: Progressing   Problem: Health Behavior/Discharge Planning: Goal: Ability to manage health-related needs will improve Outcome: Progressing   Problem: Clinical Measurements: Goal: Ability to maintain clinical measurements within normal limits will improve Outcome: Progressing Goal: Will remain free from infection Outcome: Progressing Goal: Diagnostic test results will improve Outcome: Progressing Goal: Respiratory complications will  improve Outcome: Progressing Goal: Cardiovascular complication will be avoided Outcome: Progressing   Problem: Activity: Goal: Risk for activity intolerance will decrease Outcome: Progressing   Problem: Nutrition: Goal: Adequate nutrition will be maintained Outcome: Progressing   Problem: Coping: Goal: Level of anxiety will decrease Outcome: Progressing   Problem: Elimination: Goal: Will not experience complications related to bowel motility Outcome: Progressing Goal: Will not experience complications related to urinary retention Outcome: Progressing   Problem: Pain Managment: Goal: General experience of comfort will improve and/or be controlled Outcome: Progressing   Problem: Safety: Goal: Ability to remain free from injury will improve Outcome: Progressing   Problem: Skin Integrity: Goal: Risk for impaired skin integrity will decrease Outcome: Progressing   Problem: Education: Goal: Ability to describe self-care measures that may prevent or decrease complications (Diabetes Survival Skills Education) will improve Outcome: Progressing Goal: Individualized Educational Video(s) Outcome: Progressing   Problem: Coping: Goal: Ability to adjust to condition or change in health will improve Outcome: Progressing   Problem: Fluid Volume: Goal: Ability to maintain a balanced intake and output will improve Outcome: Progressing   Problem: Health Behavior/Discharge Planning: Goal: Ability to identify and utilize available resources and services will improve Outcome: Progressing Goal: Ability to manage health-related needs will improve Outcome: Progressing   Problem: Metabolic: Goal: Ability to maintain appropriate glucose levels will improve Outcome: Progressing   Problem: Nutritional: Goal: Maintenance of adequate nutrition will improve Outcome: Progressing Goal: Progress toward achieving an optimal weight will improve Outcome: Progressing   Problem: Skin  Integrity: Goal: Risk for impaired skin integrity will decrease Outcome: Progressing   Problem: Tissue Perfusion: Goal: Adequacy of tissue perfusion will improve Outcome: Progressing

## 2023-04-10 ENCOUNTER — Inpatient Hospital Stay (HOSPITAL_COMMUNITY)
Admit: 2023-04-10 | Discharge: 2023-04-10 | Disposition: A | Discharge status: Home / Self Care | Payer: 59 | Attending: Hematology | Admitting: Hematology

## 2023-04-10 ENCOUNTER — Inpatient Hospital Stay (HOSPITAL_COMMUNITY): Payer: 59

## 2023-04-10 DIAGNOSIS — I2699 Other pulmonary embolism without acute cor pulmonale: Secondary | ICD-10-CM | POA: Diagnosis not present

## 2023-04-10 DIAGNOSIS — R29898 Other symptoms and signs involving the musculoskeletal system: Secondary | ICD-10-CM | POA: Diagnosis not present

## 2023-04-10 DIAGNOSIS — R569 Unspecified convulsions: Secondary | ICD-10-CM | POA: Diagnosis not present

## 2023-04-10 DIAGNOSIS — Z5111 Encounter for antineoplastic chemotherapy: Secondary | ICD-10-CM | POA: Diagnosis not present

## 2023-04-10 DIAGNOSIS — C8339 Primary central nervous system lymphoma: Secondary | ICD-10-CM | POA: Diagnosis not present

## 2023-04-10 DIAGNOSIS — D496 Neoplasm of unspecified behavior of brain: Secondary | ICD-10-CM | POA: Diagnosis not present

## 2023-04-10 LAB — URINALYSIS, DIPSTICK ONLY
Bilirubin Urine: NEGATIVE
Glucose, UA: NEGATIVE mg/dL
Ketones, ur: NEGATIVE mg/dL
Nitrite: NEGATIVE
Protein, ur: NEGATIVE mg/dL
Specific Gravity, Urine: 1.016 (ref 1.005–1.030)
pH: 8 (ref 5.0–8.0)

## 2023-04-10 LAB — COMPREHENSIVE METABOLIC PANEL
ALT: 123 U/L — ABNORMAL HIGH (ref 0–44)
AST: 83 U/L — ABNORMAL HIGH (ref 15–41)
Albumin: 3.6 g/dL (ref 3.5–5.0)
Alkaline Phosphatase: 108 U/L (ref 38–126)
Anion gap: 11 (ref 5–15)
BUN: 28 mg/dL — ABNORMAL HIGH (ref 8–23)
CO2: 32 mmol/L (ref 22–32)
Calcium: 8.2 mg/dL — ABNORMAL LOW (ref 8.9–10.3)
Chloride: 97 mmol/L — ABNORMAL LOW (ref 98–111)
Creatinine, Ser: 0.83 mg/dL (ref 0.61–1.24)
GFR, Estimated: >60 mL/min (ref >60)
Glucose, Bld: 152 mg/dL — ABNORMAL HIGH (ref 70–99)
Potassium: 3.3 mmol/L — ABNORMAL LOW (ref 3.5–5.1)
Sodium: 140 mmol/L (ref 135–145)
Total Bilirubin: 0.9 mg/dL (ref 0.0–1.2)
Total Protein: 6.1 g/dL — ABNORMAL LOW (ref 6.5–8.1)

## 2023-04-10 LAB — GLUCOSE, CAPILLARY
Glucose-Capillary: 114 mg/dL — ABNORMAL HIGH (ref 70–99)
Glucose-Capillary: 168 mg/dL — ABNORMAL HIGH (ref 70–99)
Glucose-Capillary: 129 mg/dL — ABNORMAL HIGH (ref 70–99)
Glucose-Capillary: 174 mg/dL — ABNORMAL HIGH (ref 70–99)

## 2023-04-10 LAB — CBC WITH DIFFERENTIAL/PLATELET
Abs Immature Granulocytes: 0.09 10*3/uL — ABNORMAL HIGH (ref 0.00–0.07)
Basophils Absolute: 0 10*3/uL (ref 0.0–0.1)
Basophils Relative: 0 %
Eosinophils Absolute: 0 10*3/uL (ref 0.0–0.5)
Eosinophils Relative: 0 %
HCT: 43.9 % (ref 39.0–52.0)
Hemoglobin: 14.3 g/dL (ref 13.0–17.0)
Immature Granulocytes: 1 %
Lymphocytes Relative: 6 %
Lymphs Abs: 0.6 10*3/uL — ABNORMAL LOW (ref 0.7–4.0)
MCH: 30 pg (ref 26.0–34.0)
MCHC: 32.6 g/dL (ref 30.0–36.0)
MCV: 92 fL (ref 80.0–100.0)
Monocytes Absolute: 0.9 10*3/uL (ref 0.1–1.0)
Monocytes Relative: 9 %
Neutro Abs: 8.2 10*3/uL — ABNORMAL HIGH (ref 1.7–7.7)
Neutrophils Relative %: 84 %
Platelets: 195 10*3/uL (ref 150–400)
RBC: 4.77 MIL/uL (ref 4.22–5.81)
RDW: 12 % (ref 11.5–15.5)
WBC: 9.8 10*3/uL (ref 4.0–10.5)
nRBC: 0 % (ref 0.0–0.2)

## 2023-04-10 LAB — MAGNESIUM: Magnesium: 2.1 mg/dL (ref 1.7–2.4)

## 2023-04-10 LAB — METHOTREXATE: Methotrexate: 20.84

## 2023-04-10 LAB — D-DIMER, QUANTITATIVE: D-Dimer, Quant: 0.49 ug{FEU}/mL (ref 0.00–0.50)

## 2023-04-10 MED ORDER — LEUCOVORIN CALCIUM INJECTION 100 MG
15.0000 mg | Freq: Four times a day (QID) | INTRAMUSCULAR | Status: AC
  Administered 2023-04-10 – 2023-04-11 (×4): 16 mg via INTRAVENOUS
  Filled 2023-04-10 (×8): qty 0.8

## 2023-04-10 MED ORDER — FUROSEMIDE 10 MG/ML IJ SOLN
20.0000 mg | Freq: Once | INTRAMUSCULAR | Status: AC
  Administered 2023-04-10: 20 mg via INTRAVENOUS
  Filled 2023-04-10: qty 2

## 2023-04-10 MED ORDER — SODIUM CHLORIDE 0.9 % IV SOLN
Freq: Once | INTRAVENOUS | Status: AC
  Administered 2023-04-10: 16 mg via INTRAVENOUS
  Filled 2023-04-10: qty 8

## 2023-04-10 MED ORDER — GABAPENTIN 100 MG PO CAPS
200.0000 mg | ORAL_CAPSULE | Freq: Three times a day (TID) | ORAL | Status: DC
  Administered 2023-04-10 – 2023-04-11 (×3): 200 mg via ORAL
  Filled 2023-04-10 (×3): qty 2

## 2023-04-10 NOTE — Plan of Care (Signed)
Problem: Education: Goal: Knowledge of disease or condition will improve Outcome: Progressing Goal: Knowledge of secondary prevention will improve (MUST DOCUMENT ALL) Outcome: Progressing Goal: Knowledge of patient specific risk factors will improve (DELETE if not current risk factor) Outcome: Progressing   Problem: Ischemic Stroke/TIA Tissue Perfusion: Goal: Complications of ischemic stroke/TIA will be minimized Outcome: Progressing   Problem: Coping: Goal: Will verbalize positive feelings about self Outcome: Progressing Goal: Will identify appropriate support needs Outcome: Progressing

## 2023-04-10 NOTE — Progress Notes (Addendum)
Tyler [name]   DOB:07/20/1950   WU#:[id number]      ASSESSMENT & PLAN:  1.  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  Patient preferred to proceed with more aggressive approach with standard treatment involving chemotherapy. - S/p craniotomy with gross total resection on 03/10/2023. - CT of head 04/07/2023 showed interval increase in size of tumor at the craniotomy site.  MRI of head 04/07/2023 increase in size of tumor with slightly increased surrounding edema. - Patient transferred to [facility] now in order to start high-dose methotrexate which was given 04/09/23. - Sodium bicarb IV and p.o. started.  Most recent urine ph 8.0. will continue to monitor daily.  IV currently infusing at 100 ml/hr as patient had slight shortness of breath noted yesterday afternoon.  None noted at this time, no wheezing or rales auscultated today.  - daily methotrexate levels ordered, continue to check until <0.1. Results pending today.  - Medical oncology/Dr. Candise Che has been following closely   2.  Altered mental status -Improving.  Alert and oriented today. - Likely due to brain mets - Continue supportive care and treatment plan as recommended by oncology.   3.  Elevated blood glucose  - Monitor blood sugar levels closely. -Primary team following    4.  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  A-fib - Status post Eliquis.  Discontinued due to high risk for bleed. - Follow closely with cardiology   6.  Prostate cancer - Diagnosed 2021   7.  Hep B antibody positive, chronic - On tenofovir for hep B reactivation suppression in context of Rituxan use.  Plan is to continue  for 6 months after his last chemotherapy. - Follow ID closely  Code Status Full   Subjective:  Patient seen awake and alert laying supine in bed, reports he feels well, that left sided pain comes and goes starts in his fingers and toes and spreads, however does not have it at this time. No shortness of breath or wheezing noted.    Objective:  Vitals:   04/09/23 2120 04/10/23 0547  BP: 132/88 (!) 162/84  Pulse: (!) 53 (!) 52  Resp: 14 14  Temp: 97.9 F (36.6 C) (!) 97.3 F (36.3 C)  SpO2: 93% 91%     Intake/Output Summary (Last 24 hours) at 04/10/2023 2956 Last data filed at 04/10/2023 2130 Gross per 24 hour  Intake 3966.28 ml  Output 3050 ml  Net 916.28 ml     REVIEW OF SYSTEMS:   Constitutional: +intermittent left sided pain, Denies fevers, chills or abnormal night sweats Eyes: Denies blurriness of vision, double vision or watery eyes Ears, nose, mouth, throat, and face: Denies mucositis or sore throat Respiratory: Denies cough, dyspnea or wheezes Cardiovascular: Denies palpitation, chest discomfort or lower extremity swelling Gastrointestinal:  Denies nausea, heartburn or change in bowel habits Skin: Denies abnormal skin rashes Lymphatics: Denies new lymphadenopathy or easy bruising Neurological: Denies numbness, tingling or new weaknesses Behavioral/Psych: Mood is stable, no new changes  All other systems were reviewed with the patient and are negative.  PHYSICAL EXAMINATION: ECOG PERFORMANCE STATUS: 2 - Symptomatic, <50% confined to bed  Vitals:   04/09/23 2120 04/10/23 0547  BP: 132/88 (!) 162/84  Pulse: (!) 53 (!) 52  Resp: 14 14  Temp: 97.9 F (36.6 C) (!) 97.3 F (36.3 C)  SpO2: 93% 91%   Filed Weights   04/09/23 2030  Weight: 191 lb 8 oz (86.9 kg)    GENERAL: alert, no distress and comfortable SKIN: skin color, texture, turgor are normal, no rashes or significant lesions EYES: normal, conjunctiva are pink and non-injected, sclera  clear OROPHARYNX: no exudate, no erythema and lips, buccal mucosa, and tongue normal  NECK: supple, thyroid normal size, non-tender, without nodularity LYMPH: no palpable lymphadenopathy in the cervical, axillary or inguinal LUNGS: clear to auscultation and percussion with normal breathing effort HEART: regular rate & rhythm and no murmurs and no lower extremity edema ABDOMEN: abdomen soft, non-tender and normal bowel sounds MUSCULOSKELETAL: no cyanosis of digits and no clubbing  PSYCH: alert & oriented x 3 with fluent speech NEURO: no focal motor/sensory deficits   All questions were answered. The patient knows to call the clinic with any problems, questions or concerns.   The total time spent in the appointment was 40 minutes encounter with patient including review of chart and various tests results, discussions about plan of care and coordination of care plan  Dawson Bills, NP 04/10/2023 9:42 AM    Labs Reviewed:  Lab Results  Component Value Date   WBC 9.4 04/09/2023   HGB 13.5 04/09/2023   HCT 40.9 04/09/2023   MCV 90.5 04/09/2023   PLT 165 04/09/2023   Recent Labs    08/11/22 0316 08/11/22 1544 08/12/22 0515 08/12/22 1400 08/20/22 0302 08/21/22 0334 01/15/23 1300 01/28/23 0840 03/06/23 2020 03/28/23 1303 04/07/23 1016 04/08/23 0520  NA 134*   < > 137   < > 137   < > 141 145   < > 140 142 141  K 3.7   < > 3.3*   < > 4.0   < > 3.6 3.6   < > 3.8 3.9 4.0  CL 99   < > 101   < > 102   < > 107 107   < > 106 108 101  CO2 25   < > 28   < > 24   < > 30 26   < > 29 23 23   GLUCOSE 185*   < > 114*   < > 78   < > 118* 72   < > 114* 102* 129*  BUN 52*   < > 41*   < > 45*   < > 12 15   < > 12 8 11   CREATININE 1.58*   < > 1.44*   < > 3.38*   < > 0.84 0.99   < > 0.82 0.94 0.88  CALCIUM 7.4*   < > 7.5*   < > 7.2*   < > 9.1 9.0   < > 8.9 9.0 9.3  GFRNONAA 47*   < > 52*   < > 19*   < > >60  --    < > >60 >60 >60  PROT 4.6*  --  4.2*  --  3.9*   < > 6.6 6.4  --  6.5 6.2*  --    ALBUMIN 1.8*  1.7*   < > 1.7*  1.6*   < > 1.7*   < > 4.2  --   --  4.1 3.6  --   AST 33  --  31  --  25   < > 38  33  --  37 40  --   ALT 31  --  31  --  18   < > 46* 40  --  65* 59*  --   ALKPHOS 84  --  79  --  111   < > 100  --   --  125 115  --   BILITOT 3.2*  --  2.5*  --  0.8   < > 0.5 0.6  --  0.5 0.4  --   BILIDIR 1.7*  --  1.3*  --  0.2  --   --   --   --   --   --   --   IBILI 1.5*  --  1.2*  --  0.6  --   --   --   --   --   --   --    < > = values in this interval not displayed.    Studies Reviewed:  MR Brain W and Wo Contrast Result Date: 04/07/2023 CLINICAL DATA:  Provided history: Neuro deficit, persistent/recurrent, CNS neoplasm suspected. Diffuse large B-cell lymphoma. Left-sided weakness with midline shift on CT. EXAM: MRI HEAD WITHOUT AND WITH CONTRAST TECHNIQUE: Multiplanar, multiecho pulse sequences of the brain and surrounding structures were obtained without and with intravenous contrast. CONTRAST:  8mL GADAVIST GADOBUTROL 1 MMOL/ML IV SOLN COMPARISON:  Head CT 04/07/2023.  Brain MRI 03/07/2023 neck FINDINGS: Intermittently motion degraded examination. Most notably, the axial T1 post-contrast sequence and coronal T2 sequence are moderately motion degraded. Within this limitation findings are as follows. Brain: Since the prior MRI 06/05/2023, there has been right parietal craniotomy for biopsy/debulking of an enhancing right parietal lobe tumor. Non-acute blood products at the operative site. The residual enhancing tumor measures 5.4 x 3.6 x 4.4 cm (previously 4.4 x 3.0 x 3.3 when remeasured on prior). Prominent surrounding edema slightly increased. 6 mm leftward midline shift has also increased (previously 4 mm). Persistent effacement of portions of the right lateral ventricle. Persistent asymmetric prominence of the right lateral ventricle temporal horn consistent with ventricular entrapment. Trace postoperative subdural collection along the anterior right cerebral hemisphere.  Mild-to-moderate multifocal T2 FLAIR hyperintense signal abnormality elsewhere within the cerebral white matter, nonspecific but compatible with chronic small vessel ischemic disease. There is no acute infarct. Vascular: Maintained flow voids within the proximal large arterial vessels. Skull and upper cervical spine: Right parietal cranioplasty. No focal worrisome marrow lesion. Sinuses/orbits: No orbital mass or acute orbital finding.Mild mucosal thickening within the left maxillary sinus. IMPRESSION: 1. Motion degraded examination. 2. Since the prior brain MRI 03/07/2023, there has been right parietal craniotomy for biopsy/debulking of an enhancing right parietal lobe tumor. The residual tumor has increased in size, now measuring 5.4 x 3.6 x 4.4 cm (previously 4.4 x 3.0 x 3.3 cm). Prominent surrounding edema has slightly increased. 6 mm leftward midline shift has also increased. Unchanged asymmetric prominence of the right lateral ventricle temporal horn consistent with ventricular entrapment. Electronically Signed   By: Jackey Loge D.O.   On: 04/07/2023 14:01   CT Head Wo Contrast Result Date: 04/07/2023 CLINICAL DATA:  Facial paralysis/weakness (CN 7) EXAM: CT HEAD WITHOUT CONTRAST TECHNIQUE: Contiguous axial images were obtained from the base of the skull through the vertex without intravenous contrast. RADIATION DOSE REDUCTION: This exam was performed according to the departmental dose-optimization program which includes automated exposure control, adjustment of the mA and/or kV according to patient size and/or use of iterative reconstruction technique. COMPARISON:  Head  CT 03/06/2023, brain MR 03/07/2023 FINDINGS: Brain: No hemorrhage. No hydrocephalus. No extra-axial fluid collection postsurgical changes from a right parietal craniotomy with interval increase in size of the residual/recurrent tumor at the craniotomy site, now measuring up to 4.7 x 2.7 cm, previously 3.2 x 2.5 cm, when measured in a  similar orientation. There is increased leftward midline shift, now measuring up to 6 mm, previously 3 mm. Unchanged disproportionate enlargement of the right temporal horn, worrisome for ventricular entrapment. Vascular: No hyperdense vessel or unexpected calcification. Skull: Normal. Negative for fracture or focal lesion. Status post parietal craniotomy Sinuses/Orbits: No middle ear or mastoid effusion. Mucosal thickening in the bilateral maxillary sinuses, right-greater-than-left orbits are unremarkable. Other: None. IMPRESSION: 1. Postsurgical changes from a right parietal craniotomy with interval increase in size of the residual/recurrent tumor at the craniotomy site, now measuring up to 4.7 x 2.7 cm, previously 3.2 x 2.5 cm. There is increased leftward midline shift, now measuring up to 6 mm, previously 3 mm. Recommend further evaluation with brain MRI. 2. Unchanged asymmetric enlargement of the right temporal horn, compatible with ventricular entrapment Electronically Signed   By: Lorenza Cambridge M.D.   On: 04/07/2023 10:57    ADDENDUM  .Patient was Personally and independently interviewed, examined and relevant elements of the history of present illness were reviewed in details and an assessment and plan was created. All elements of the patient's history of present illness , assessment and plan were discussed in details with Hinton Dyer NP. The above documentation reflects our combined findings assessment and plan.   1.Patient's O2 sat @ 89% , few basal rales -port CXR -incentive spirometry -ordered lasix 20mg  x 1 - d dimer  is positive and CXR clear will need to consider CTA chest -on lovenox low dose of VTE prophylaxis  2. Recurrent short episodes of left sided pain/warmth and discomfort coming in episodes . Concerning for partial sensory seizures -EEG -will plan to start Keppra after EEG  3. CNS lymphoma -- s/p 1st dose of HD Methotrexate -continue urinary alkalinization with po and IV  bicarb -continue leucovorin rescue till methotrexate levels <0.1 -zofran/Dexamethasone daily till methortexate levels<0.1  Wyvonnia Lora MD MS

## 2023-04-10 NOTE — Progress Notes (Signed)
[facility]  PROGRESS NOTE  Tyler [name] ZHY:[id number] DOB: 05-Jul-1950 DOA: 04/07/2023 PCP: Nechama Guard, FNP   Brief HPI:    73yo with h/o afib not on AC due to high bleeding risk, ETOH use d/o, prostate CA s/p prostatectomy, chronic Hep B on tenofovir, and high-grade B cell lymphoma with brain mets s/p parietal resection and complicated by severe pancytopenia and tumor lysis syndrome who presented on 2/10 with L-sided weakness. Neurosurgery was consulted and notes significant recurrence of high-grade lymphoma with recommendation to start chemo and/or rads expeditiously. Oncology is consulted.    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  Patient preferred to proceed with more aggressive approach with standard treatment involving chemotherapy Presented with worsening left-sided weakness  CT on 2/10 with interval increase in size of tumor at the craniotomy site MRI on 2/10 with increase in size of tumor with slightly increased surrounding edema Neurosurgery was consulted and recommends expedited chemo/rads therapy He was started on Decadron 6 mg q6h Oncology was consulted, patient was transferred to [facility] long hospital for initiation of high-dose methotrexate -Sodium bicarb IV and p.o. started, need to achieve urine pH more than 7 I -Urine pH 8.0 this morning -Sodium bicarb infusion started this morning  Hypoxia -Patient had mild hypoxia yesterday when he was getting IV fluids at 150 mL/h. -Bicarb infusion was held overnight, this morning denies shortness of breath. -Chest x-ray portable has been ordered, will  follow   H/o dysphagia This was an issue during his last hospitalization SLP consulted for diet recommendations (previously on dysphagia 3 diet)   Altered mental status Likely due to brain mets Continue supportive care and treatment plan as recommended by oncology.   A-fib No AC due to high bleeding risk Rate controlled with Toprol XL  History of bilateral pulmonary embolism Not on AC at this time He was hospitalized for a prolonged period of time last June with severe pancytopenia and tumor lysis syndrome; he was transfused about 15 units PRBC + platelets   Hypertension Continue Toprol XL   Prostate cancer Diagnosed 2021   Hep B antibody positive, chronic On tenofovir for hep B reactivation suppression in context of Rituxan use Plan is to continue for 6 months after his last chemotherapy. Follow ID closely Tenofovir may need to be held while on methotrexate - will defer to oncology   Gout Continue allopurinol prn   Neuropathic pain -Complain of left upper extremity neuropathic pain; little improvement with gabapentin -Will increase gabapentin to 200 mg p.o. 3 times daily   Medications     allopurinol  100 mg Oral BID   B-complex with vitamin C  1 tablet Oral Daily   Chlorhexidine Gluconate Cloth  6 each Topical Daily   cholecalciferol  1,000 Units Oral Daily   dexamethasone (DECADRON) injection  6 mg Intravenous Q12H   enoxaparin (LOVENOX) injection  30 mg Subcutaneous Q24H   folic acid  1 mg Oral Daily   gabapentin  100 mg Oral TID   insulin aspart  0-6 Units Subcutaneous TID WC   leucovorin  16 mg Intravenous Q6H   metoprolol succinate  25 mg Oral Daily   sodium bicarbonate  650 mg  Oral TID   sodium chloride flush  10-40 mL Intracatheter Q12H   tenofovir alafenamide  25 mg Oral Daily     Data Reviewed:   CBG:  Recent Labs  Lab 04/09/23 1249 04/09/23 1621 04/09/23 2134 04/10/23 0730  GLUCAP 117* 192* 143* 114*    SpO2: 91 %    Vitals:    04/09/23 1323 04/09/23 2030 04/09/23 2120 04/10/23 0547  BP: (!) 153/87  132/88 (!) 162/84  Pulse: (!) 54  (!) 53 (!) 52  Resp: 16  14 14   Temp: 98 F (36.7 C)  97.9 F (36.6 C) (!) 97.3 F (36.3 C)  TempSrc: Oral  Oral Oral  SpO2: 92%  93% 91%  Weight:  86.9 kg    Height:  5\' 7"  (1.702 m)        Data Reviewed:  Basic Metabolic Panel: Recent Labs  Lab 04/07/23 1016 04/08/23 0520  NA 142 141  K 3.9 4.0  CL 108 101  CO2 23 23  GLUCOSE 102* 129*  BUN 8 11  CREATININE 0.94 0.88  CALCIUM 9.0 9.3  MG  --  1.7    CBC: Recent Labs  Lab 04/07/23 1016 04/08/23 0520 04/09/23 1910  WBC 5.1 7.6 9.4  NEUTROABS 2.4  --  8.2*  HGB 14.4 14.7 13.5  HCT 43.1 42.6 40.9  MCV 90.4 88.6 90.5  PLT 156 178 165    LFT Recent Labs  Lab 04/07/23 1016  AST 40  ALT 59*  ALKPHOS 115  BILITOT 0.4  PROT 6.2*  ALBUMIN 3.6     Antibiotics: Anti-infectives (From admission, onward)    Start     Dose/Rate Route Frequency Ordered Stop   04/07/23 1500  tenofovir alafenamide (VEMLIDY) tablet 25 mg        25 mg Oral Daily 04/07/23 1222          DVT prophylaxis: Lovenox  Code Status: Full code  Family Communication: No family at bedside   CONSULTS oncology, neurosurgery   Subjective   Denies shortness of breath.  Yesterday developed wheezing so I am bicarb was held overnight.  Restarted this morning at 100 mL/h.   Objective    Physical Examination:  General-appears in no acute distress Heart-S1-S2, regular, no murmur auscultated Lungs-clear to auscultation bilaterally, no wheezing or crackles auscultated Abdomen-soft, nontender, no organomegaly Extremities-no edema in the lower extremities Neuro-alert, oriented x3, no focal deficit noted   Status is: Inpatient:          Meredeth Ide   [facility] If 7PM-7AM, please contact night-coverage at [email or web address], Office  [phone]   04/10/2023, 11:23 AM  LOS: 3 days

## 2023-04-10 NOTE — Progress Notes (Signed)
Occupational Therapy Treatment Patient Details Name: Tyler [name] MRN: [id number] DOB: 03/03/1950 Today's Date: 04/10/2023   History of present illness 73 yo male presents to [facility] on 2/10 with LUE and LE weakness, and change in mentation. Head CT of brain shows a recurrent tumor at the craniotomy site and increased size of recurrent tumor along with a left midline shift up to 6 mm. 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   OT comments  Patient with good progress toward patient focused goals.  Patient continues with an inattention to his L side, but appears to be moving the extremity better.  Patient needing safety cues, increased time and up to Min A for ADL completion and CGA for basic step pivot transfers.  AIR is not following the patient, and HH OT can be considered if he has the needed assist at home.  OT will continue efforts in the acute setting to address deficits.        If plan is discharge home, recommend the following:  A little help with walking and/or transfers;A lot of help with bathing/dressing/bathroom;Assistance with cooking/housework;Assistance with feeding;Assist for transportation;Help with stairs or ramp for entrance;Direct supervision/assist for medications management;Direct supervision/assist for financial management   Equipment Recommendations       Recommendations for Other Services      Precautions / Restrictions Precautions Precautions: Fall Restrictions Weight Bearing Restrictions Per Provider Order: No       Mobility Bed Mobility Overal bed mobility: Needs Assistance Bed Mobility: Supine to Sit     Supine to sit: Supervision          Transfers Overall transfer level: Needs assistance Equipment used: Rolling walker (2  wheels), 1 person hand held assist Transfers: Sit to/from Stand Sit to Stand: Supervision     Step pivot transfers: Supervision, Contact guard assist           Balance Overall balance assessment: Needs assistance Sitting-balance support: Feet supported, No upper extremity supported Sitting balance-Leahy Scale: Fair   Postural control: Right lateral lean Standing balance support: Reliant on assistive device for balance, Single extremity supported Standing balance-Leahy Scale: Fair Standing balance comment: needs UE support for dynamic balance                           ADL either performed or assessed with clinical judgement   ADL                   Upper Body Dressing : Contact guard assist;Sitting   Lower Body Dressing: Minimal assistance;Sit to/from stand   Toilet Transfer: Contact guard assist;Ambulation;Rolling walker (2 wheels)                  Extremity/Trunk Assessment Upper Extremity Assessment Upper Extremity Assessment: LUE deficits/detail LUE Deficits / Details: Full AAROM with min increase in tone during activity LUE Sensation: decreased light touch LUE Coordination: decreased fine motor   Lower Extremity Assessment Lower Extremity Assessment: Defer to PT evaluation   Cervical / Trunk Assessment Cervical / Trunk Assessment: Normal    Vision Patient Visual Report: No change from baseline     Perception     Praxis     Communication Communication Communication: No apparent difficulties Factors Affecting Communication: Non - English speaking, interpreter not available  Cognition Arousal: Alert Behavior During Therapy: WFL for tasks assessed/performed, Impulsive Cognition: No family/caregiver present to determine baseline                               Following commands: Impaired Following commands impaired: Follows one step commands with increased time      Cueing   Cueing Techniques: Verbal cues, Gestural  cues, Tactile cues, Visual cues  Exercises      Shoulder Instructions       General Comments      Pertinent Vitals/ Pain       Pain Assessment Pain Assessment: Faces Faces Pain Scale: Hurts little more Pain Location: L UE Pain Descriptors / Indicators: Burning Pain Intervention(s): Monitored during session                                                          Frequency  Min 1X/week        Progress Toward Goals  OT Goals(current goals can now be found in the care plan section)  Progress towards OT goals: Progressing toward goals  Acute Rehab OT Goals OT Goal Formulation: With patient Time For Goal Achievement: 04/22/23 Potential to Achieve Goals: Good  Plan Discharge plan remains appropriate    Co-evaluation                 AM-PAC OT "6 Clicks" Daily Activity     Outcome Measure   Help from another person eating meals?: None Help from another person taking care of personal grooming?: A Little Help from another person toileting, which includes using toliet, bedpan, or urinal?: A Little Help from another person bathing (including washing, rinsing, drying)?: A Little Help from another person to put on and taking off regular upper body clothing?: A Little Help from another person to put on and taking off regular lower body clothing?: A Little 6 Click Score: 19    End of Session Equipment Utilized During Treatment: Gait belt;Rolling walker (2 wheels)  OT Visit Diagnosis: Unsteadiness on feet (R26.81);Other abnormalities of gait and mobility (R26.89);Muscle weakness (generalized) (M62.81) Pain - Right/Left: Left Pain - part of body: Arm   Activity Tolerance Patient tolerated treatment well   Patient Left in chair;with call bell/phone within reach;with chair alarm set   Nurse Communication          Time: [phone] OT Time Calculation (min): 22 min  Charges: OT General Charges $OT Visit: 1 Visit OT Treatments $Self  Care/Home Management : 8-22 mins  04/10/2023  RP, OTR/L  Acute Rehabilitation Services  Office:  [phone]   Suzanna Obey 04/10/2023, 9:27 AM

## 2023-04-10 NOTE — Progress Notes (Signed)
EEG complete - results pending

## 2023-04-10 NOTE — Progress Notes (Signed)
Physical Therapy Treatment Patient Details Name: Tyler [name] MRN: [id number] DOB: 1950/07/15 Today's Date: 04/10/2023   History of Present Illness 73 yo male presents to [facility] on 2/10 with LUE and LE weakness, and change in mentation. Head CT of brain shows a recurrent tumor at the craniotomy site and increased size of recurrent tumor along with a left midline shift up to 6 mm. 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    PT Comments  Pt in bed when PT arrives, dtr present. Pt able to come to sit EOB with inc time and use of hand rails. Able to stand with sup A. PT follows with IV pole, pt requests to walk without physical assist, removed CGA and pt continues at sup A. He is very pleasant and excited about moving and walking. Pt returns to room and sits upright in chair, CXR arrives. Pt would benefit from trial of decreasing AD level of assist next session due to LUE pain and level of function at this time, consider LLE paraesthesia and proprioception during swing phase of gait.     If plan is discharge home, recommend the following: A little help with bathing/dressing/bathroom;Assistance with cooking/housework;Direct supervision/assist for medications management;Direct supervision/assist for financial management;Assist for transportation;Help with stairs or ramp for entrance;Supervision due to cognitive status   Can travel by private vehicle     Yes  Equipment Recommendations  None recommended by PT    Recommendations for Other Services       Precautions / Restrictions Precautions Precautions: Fall Precaution/Restrictions Comments: crani 1/13 Restrictions Weight Bearing Restrictions Per Provider Order: No     Mobility  Bed Mobility Overal bed mobility: Needs  Assistance (HOB elevated) Bed Mobility: Supine to Sit Rolling: Supervision   Supine to sit: Supervision     General bed mobility comments: in bed, returns to chair    Transfers Overall transfer level: Needs assistance Equipment used: Rolling walker (2 wheels) Transfers: Sit to/from Stand Sit to Stand: Supervision                Ambulation/Gait Ambulation/Gait assistance: Supervision, Contact guard assist Gait Distance (Feet): 750 Feet Assistive device: Rolling walker (2 wheels) Gait Pattern/deviations: Step-through pattern, Decreased weight shift to left, Decreased step length - right, Decreased step length - left, Shuffle, Ataxic Gait velocity: dec     General Gait Details: Pt has upright posture with amb, swing phase surpasses stance phase bilaterally. Pt has intermittent LUE pain and paraesthesia which causes him to remove grip   Stairs             Wheelchair Mobility     Tilt Bed    Modified Rankin (Stroke Patients Only)       Balance Overall balance assessment: Needs assistance Sitting-balance support: Feet supported, No upper extremity supported Sitting balance-Leahy Scale: Fair Sitting balance - Comments: supervision Postural control: Right lateral lean Standing balance support: No upper extremity supported Standing balance-Leahy Scale: Good Standing balance comment: needs UE support for dynamic balance and progressive mobility                            Communication Communication Communication: No apparent difficulties  Cognition Arousal: Alert Behavior During Therapy: WFL for tasks assessed/performed   PT - Cognitive impairments: No apparent impairments  Following commands: Intact      Cueing Cueing Techniques: Verbal cues, Gestural cues  Exercises      General Comments General comments (skin integrity, edema, etc.): maintains 92% + on RA with PT this session      Pertinent  Vitals/Pain Pain Assessment Pain Assessment: Faces Faces Pain Scale: Hurts whole lot (intermittent paraesthesia and pain LUE and LLE) Pain Location: L UE Pain Descriptors / Indicators: Burning, Throbbing Pain Intervention(s): Monitored during session    Home Living                          Prior Function            PT Goals (current goals can now be found in the care plan section) Acute Rehab PT Goals Patient Stated Goal: none stated PT Goal Formulation: With patient Time For Goal Achievement: 03/22/23 Potential to Achieve Goals: Good Progress towards PT goals: Progressing toward goals    Frequency    Min 1X/week      PT Plan      Co-evaluation              AM-PAC PT "6 Clicks" Mobility   Outcome Measure  Help needed turning from your back to your side while in a flat bed without using bedrails?: None Help needed moving from lying on your back to sitting on the side of a flat bed without using bedrails?: A Little Help needed moving to and from a bed to a chair (including a wheelchair)?: A Little Help needed standing up from a chair using your arms (e.g., wheelchair or bedside chair)?: None Help needed to walk in hospital room?: A Little Help needed climbing 3-5 steps with a railing? : A Lot 6 Click Score: 19    End of Session Equipment Utilized During Treatment: Gait belt Activity Tolerance: Patient tolerated treatment well Patient left: with call bell/phone within reach;in chair;with chair alarm set;with family/visitor present Nurse Communication: Mobility status;Other (comment) (O2 sats) PT Visit Diagnosis: Unsteadiness on feet (R26.81);Other abnormalities of gait and mobility (R26.89);Other symptoms and signs involving the nervous system (R29.898);Ataxic gait (R26.0)     Time: 1610-9604 PT Time Calculation (min) (ACUTE ONLY): 43 min  Charges:    $Gait Training: 23-37 mins $Therapeutic Activity: 8-22 mins PT General Charges $$ ACUTE PT  VISIT: 1 Visit                     Madaline Guthrie, PT Acute Rehabilitation Services Office: [phone] 04/10/2023    [name] 04/10/2023, 4:02 PM

## 2023-04-11 ENCOUNTER — Encounter: Payer: Self-pay | Admitting: Hematology

## 2023-04-11 ENCOUNTER — Other Ambulatory Visit (HOSPITAL_COMMUNITY): Payer: 59

## 2023-04-11 DIAGNOSIS — R29898 Other symptoms and signs involving the musculoskeletal system: Secondary | ICD-10-CM | POA: Diagnosis not present

## 2023-04-11 DIAGNOSIS — C8339 Primary central nervous system lymphoma: Secondary | ICD-10-CM | POA: Diagnosis not present

## 2023-04-11 DIAGNOSIS — R569 Unspecified convulsions: Secondary | ICD-10-CM | POA: Diagnosis not present

## 2023-04-11 DIAGNOSIS — I2699 Other pulmonary embolism without acute cor pulmonale: Secondary | ICD-10-CM | POA: Diagnosis not present

## 2023-04-11 DIAGNOSIS — D496 Neoplasm of unspecified behavior of brain: Secondary | ICD-10-CM | POA: Diagnosis not present

## 2023-04-11 DIAGNOSIS — Z5111 Encounter for antineoplastic chemotherapy: Secondary | ICD-10-CM | POA: Diagnosis not present

## 2023-04-11 LAB — URINALYSIS, DIPSTICK ONLY
Bilirubin Urine: NEGATIVE
Glucose, UA: NEGATIVE mg/dL
Hgb urine dipstick: NEGATIVE
Ketones, ur: NEGATIVE mg/dL
Leukocytes,Ua: NEGATIVE
Nitrite: NEGATIVE
Protein, ur: NEGATIVE mg/dL
Specific Gravity, Urine: 1.008 (ref 1.005–1.030)
pH: 8 (ref 5.0–8.0)

## 2023-04-11 LAB — COMPREHENSIVE METABOLIC PANEL
ALT: 105 U/L — ABNORMAL HIGH (ref 0–44)
AST: 55 U/L — ABNORMAL HIGH (ref 15–41)
Albumin: 3.4 g/dL — ABNORMAL LOW (ref 3.5–5.0)
Alkaline Phosphatase: 92 U/L (ref 38–126)
Anion gap: 13 (ref 5–15)
BUN: 27 mg/dL — ABNORMAL HIGH (ref 8–23)
CO2: 35 mmol/L — ABNORMAL HIGH (ref 22–32)
Calcium: 8 mg/dL — ABNORMAL LOW (ref 8.9–10.3)
Chloride: 92 mmol/L — ABNORMAL LOW (ref 98–111)
Creatinine, Ser: 0.98 mg/dL (ref 0.61–1.24)
GFR, Estimated: >60 mL/min (ref >60)
Glucose, Bld: 138 mg/dL — ABNORMAL HIGH (ref 70–99)
Potassium: 3 mmol/L — ABNORMAL LOW (ref 3.5–5.1)
Sodium: 140 mmol/L (ref 135–145)
Total Bilirubin: 0.7 mg/dL (ref 0.0–1.2)
Total Protein: 5.7 g/dL — ABNORMAL LOW (ref 6.5–8.1)

## 2023-04-11 LAB — CBC WITH DIFFERENTIAL/PLATELET
Abs Immature Granulocytes: 0.05 10*3/uL (ref 0.00–0.07)
Basophils Absolute: 0 10*3/uL (ref 0.0–0.1)
Basophils Relative: 0 %
Eosinophils Absolute: 0 10*3/uL (ref 0.0–0.5)
Eosinophils Relative: 0 %
HCT: 42.3 % (ref 39.0–52.0)
Hemoglobin: 14.1 g/dL (ref 13.0–17.0)
Immature Granulocytes: 1 %
Lymphocytes Relative: 12 %
Lymphs Abs: 0.9 10*3/uL (ref 0.7–4.0)
MCH: 30.4 pg (ref 26.0–34.0)
MCHC: 33.3 g/dL (ref 30.0–36.0)
MCV: 91.2 fL (ref 80.0–100.0)
Monocytes Absolute: 0.5 10*3/uL (ref 0.1–1.0)
Monocytes Relative: 7 %
Neutro Abs: 5.6 10*3/uL (ref 1.7–7.7)
Neutrophils Relative %: 80 %
Platelets: 170 10*3/uL (ref 150–400)
RBC: 4.64 MIL/uL (ref 4.22–5.81)
RDW: 12.3 % (ref 11.5–15.5)
WBC: 7 10*3/uL (ref 4.0–10.5)
nRBC: 0 % (ref 0.0–0.2)

## 2023-04-11 LAB — GLUCOSE, CAPILLARY
Glucose-Capillary: 123 mg/dL — ABNORMAL HIGH (ref 70–99)
Glucose-Capillary: 179 mg/dL — ABNORMAL HIGH (ref 70–99)
Glucose-Capillary: 128 mg/dL — ABNORMAL HIGH (ref 70–99)
Glucose-Capillary: 167 mg/dL — ABNORMAL HIGH (ref 70–99)

## 2023-04-11 LAB — METHOTREXATE: Methotrexate: 0.68

## 2023-04-11 MED ORDER — LEUCOVORIN CALCIUM INJECTION 100 MG
15.0000 mg | Freq: Four times a day (QID) | INTRAMUSCULAR | Status: DC
  Administered 2023-04-11: 16 mg via INTRAVENOUS
  Filled 2023-04-11 (×2): qty 0.8

## 2023-04-11 MED ORDER — GABAPENTIN 300 MG PO CAPS: 300.0000 mg | ORAL_CAPSULE | Freq: Three times a day (TID) | ORAL | Status: DC

## 2023-04-11 MED ORDER — LEVETIRACETAM 500 MG PO TABS
500.0000 mg | ORAL_TABLET | Freq: Two times a day (BID) | ORAL | Status: DC
Start: 2023-04-11 — End: 2023-04-14
  Administered 2023-04-11 – 2023-04-14 (×7): 500 mg via ORAL
  Filled 2023-04-11 (×7): qty 1

## 2023-04-11 MED ORDER — LEUCOVORIN CALCIUM INJECTION 100 MG
15.0000 mg | INTRAMUSCULAR | Status: AC
  Administered 2023-04-11 – 2023-04-12 (×7): 16 mg via INTRAVENOUS
  Filled 2023-04-11 (×12): qty 0.8

## 2023-04-11 MED ORDER — POTASSIUM CHLORIDE CRYS ER 20 MEQ PO TBCR
40.0000 meq | EXTENDED_RELEASE_TABLET | ORAL | Status: AC
  Administered 2023-04-11 (×2): 40 meq via ORAL
  Filled 2023-04-11 (×2): qty 2

## 2023-04-11 NOTE — Plan of Care (Signed)
Problem: Education: Goal: Knowledge of disease or condition will improve Outcome: Progressing Goal: Knowledge of secondary prevention will improve (MUST DOCUMENT ALL) Outcome: Progressing Goal: Knowledge of patient specific risk factors will improve (DELETE if not current risk factor) Outcome: Progressing   Problem: Ischemic Stroke/TIA Tissue Perfusion: Goal: Complications of ischemic stroke/TIA will be minimized Outcome: Progressing   Problem: Coping: Goal: Will verbalize positive feelings about self Outcome: Progressing Goal: Will identify appropriate support needs Outcome: Progressing

## 2023-04-11 NOTE — Progress Notes (Signed)
Physical Therapy Treatment Patient Details Name: Tyler [name] MRN: [id number] DOB: 1950/06/30 Today's Date: 04/11/2023   History of Present Illness 73 yo male presents to [facility] on 2/10 with LUE and LE weakness, and change in mentation. Head CT of brain shows a recurrent tumor at the craniotomy site and increased size of recurrent tumor along with a left midline shift up to 6 mm. 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    PT Comments  Pt received sitting EOB, daughter at bedside. Educated provided on benefits of using RW until strength fully returns. Pt however does appear to be functionally improving with ability to transfer in/out of bed and complete sit<>stand transfers with Supervision. Pt did experience significant burning pain in L UE just prior to standing lasting 1-2 minutes before resolving. MD states this is a result of seizure activity and pt will be started on Keppra. Pt tolerated long distance gait training in hallway with minimal support from RW and no ataxia noted this date. Will continue to progress acutely until cleared for d/c home.     If plan is discharge home, recommend the following: A little help with bathing/dressing/bathroom;Assistance with cooking/housework;Direct supervision/assist for medications management;Direct supervision/assist for financial management;Assist for transportation;Help with stairs or ramp for entrance;Supervision due to cognitive status   Can travel by private vehicle     Yes  Equipment Recommendations  None recommended by PT    Recommendations for Other Services       Precautions / Restrictions Precautions Precautions: Fall Recall of Precautions/Restrictions: Impaired Precaution/Restrictions Comments: crani  1/13 Restrictions Weight Bearing Restrictions Per Provider Order: No     Mobility  Bed Mobility Overal bed mobility: Needs Assistance Bed Mobility: Supine to Sit Rolling: Supervision   Supine to sit: Supervision Sit to supine: Supervision        Transfers Overall transfer level: Needs assistance Equipment used: Rolling walker (2 wheels) Transfers: Sit to/from Stand Sit to Stand: Supervision           General transfer comment: Good demonstration of technique, no significant grip strength noted L UE    Ambulation/Gait Ambulation/Gait assistance: Supervision, Contact guard assist Gait Distance (Feet): 600 Feet Assistive device: Rolling walker (2 wheels) Gait Pattern/deviations: Step-through pattern, Drifts right/left   Gait velocity interpretation: 1.31 - 2.62 ft/sec, indicative of limited community ambulator   General Gait Details: Less ataxic this date, minimal reliance on RW for balance/support   Stairs             Wheelchair Mobility     Tilt Bed    Modified Rankin (Stroke Patients Only)       Balance Overall balance assessment: Needs assistance Sitting-balance support: Feet supported, No upper extremity supported Sitting balance-Leahy Scale: Good     Standing balance support: No upper extremity supported Standing balance-Leahy Scale: Good Standing balance comment: needs UE support for dynamic balance and progressive mobility                            Communication Communication Communication: No apparent difficulties Factors Affecting Communication: Other (comment);Reduced clarity of speech (Broken English, daughter present)  Cognition Arousal: Alert Behavior During Therapy: WFL for tasks assessed/performed   PT - Cognitive impairments: No apparent impairments  Following commands: Intact Following commands impaired: Follows one step commands with increased time    Cueing Cueing  Techniques: Verbal cues, Gestural cues  Exercises      General Comments General comments (skin integrity, edema, etc.): Pt receiving chemo during session, urine cath intact and secured to R thigh. Educated pt and daughter on role of PT and benefits of frequent mobility.      Pertinent Vitals/Pain Pain Assessment Pain Assessment: Faces Faces Pain Scale: Hurts whole lot Pain Location: L UE Pain Descriptors / Indicators: Burning, Sharp, Shooting, Spasm (Random siezures resulting in burning sensation in L UE) Pain Intervention(s): Monitored during session    Home Living                          Prior Function            PT Goals (current goals can now be found in the care plan section) Acute Rehab PT Goals Patient Stated Goal: none stated    Frequency    Min 1X/week      PT Plan Discharge plan needs to be updated    Co-evaluation              AM-PAC PT "6 Clicks" Mobility   Outcome Measure  Help needed turning from your back to your side while in a flat bed without using bedrails?: None Help needed moving from lying on your back to sitting on the side of a flat bed without using bedrails?: A Little Help needed moving to and from a bed to a chair (including a wheelchair)?: A Little Help needed standing up from a chair using your arms (e.g., wheelchair or bedside chair)?: None Help needed to walk in hospital room?: A Little Help needed climbing 3-5 steps with a railing? : A Little 6 Click Score: 20    End of Session Equipment Utilized During Treatment: Gait belt Activity Tolerance: Patient tolerated treatment well Patient left: with call bell/phone within reach;in chair;with chair alarm set;with family/visitor present Nurse Communication: Mobility status PT Visit Diagnosis: Unsteadiness on feet (R26.81);Other abnormalities of gait and mobility (R26.89);Other symptoms and signs involving the nervous system (R29.898);Ataxic gait (R26.0)     Time:  1610-9604 PT Time Calculation (min) (ACUTE ONLY): 24 min  Charges:    $Gait Training: 8-22 mins $Therapeutic Activity: 8-22 mins PT General Charges $$ ACUTE PT VISIT: 1 Visit                    Zadie Cleverly, PTA  Jannet Askew 04/11/2023, 4:59 PM

## 2023-04-11 NOTE — Procedures (Signed)
Patient Name: Tyler [name]  MRN: [id number]  Epilepsy Attending: Charlsie Quest  Referring Physician/Provider: Johney Maine, MD  Date: 04/10/2023 Duration: 22.17 mins  Patient history: 73yo M patient with rt parietal CNS lymphoma with intermittent left sided painful burning paresthesias and weakness. EEG to evaluate for seizure  Level of alertness: Awake, asleep  AEDs during EEG study: None  Technical aspects: This EEG study was done with scalp electrodes positioned according to the 10-20 International system of electrode placement. Electrical activity was reviewed with band pass filter of 1-70Hz , sensitivity of 7 uV/mm, display speed of 42mm/sec with a 60Hz  notched filter applied as appropriate. EEG data were recorded continuously and digitally stored.  Video monitoring was available and reviewed as appropriate.  Description: The posterior dominant rhythm consists of 8-9 Hz activity of moderate voltage (25-35 uV) seen predominantly in posterior head regions, symmetric and reactive to eye opening and eye closing. Sleep was characterized by vertex waves, sleep spindles (12 to 14 Hz), maximal frontocentral region. EEG showed continuous sharply contoured 3 to 6 Hz theta-delta slowing in right parietal region consistent with breach artifact. Sharp waves were noted in right parietal region. Hyperventilation and photic stimulation were not performed.     ABNORMALITY - Sharp wave, right parietal region - Continuous slow,  right parietal region  IMPRESSION: This study showed evidence of epileptogenicity arising from right parietal region. Additionally there is cortical dysfunction arising from right parietal region consistent with underlying craniotomy. No seizures were seen throughout the recording.  Townes Fuhs [name]

## 2023-04-11 NOTE — Progress Notes (Signed)
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 Neurosurgery was consulted and notes significant recurrence of high-grade lymphoma with recommendation to start chemo and/or rads expeditiously. Oncology is consulted.    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  Patient preferred to proceed with more aggressive approach with standard treatment involving chemotherapy Presented with worsening left-sided weakness  CT on 2/10 with interval increase in size of tumor at the craniotomy site MRI on 2/10 with increase in size of tumor with slightly increased surrounding edema Neurosurgery was consulted and recommends expedited chemo/rads therapy He was started on Decadron 6 mg q6h Oncology was consulted, patient was transferred to [facility] long hospital for initiation of high-dose methotrexate -Sodium bicarb IV and p.o. started, need to achieve urine pH more than 7 I -Urine pH 8.0 this morning -Sodium bicarb infusion started this morning  Hypoxia -Patient had mild hypoxia yesterday when he was getting IV fluids at 150 mL/h. -Bicarb infusion was held overnight, this morning denies shortness of breath. -Chest x-ray portable ordered, no acute abnormality  noted  Hypokalemia -Potassium is 3.0 -Replace potassium and follow BMP in am   H/o dysphagia This was an issue during his last hospitalization SLP consulted for diet recommendations (previously on dysphagia 3 diet)   Altered mental status Likely due to brain mets Continue supportive care and treatment plan as recommended by oncology.   A-fib No AC due to high bleeding risk Rate controlled with Toprol XL  History of bilateral pulmonary embolism Not on AC at this time He was hospitalized for a prolonged period of time last June with severe pancytopenia and tumor lysis syndrome; he was transfused about 15 units PRBC + platelets   Hypertension Continue Toprol XL   Prostate cancer Diagnosed 2021   Hep B antibody positive, chronic On tenofovir for hep B reactivation suppression in context of Rituxan use Plan is to continue for 6 months after his last chemotherapy. Follow ID closely Tenofovir may need to be held while on methotrexate - will defer to oncology   Gout Continue allopurinol prn   Neuropathic pain -Complain of left upper extremity neuropathic pain; little improvement with gabapentin -Will increase gabapentin to 300 mg p.o. 3 times daily   Medications     allopurinol  100 mg Oral BID   B-complex with vitamin C  1 tablet Oral Daily   Chlorhexidine Gluconate Cloth  6 each Topical Daily   cholecalciferol  1,000 Units Oral Daily   dexamethasone (DECADRON) injection  6 mg Intravenous Q12H   enoxaparin (LOVENOX) injection  30 mg Subcutaneous Q24H   folic acid  1 mg Oral Daily   gabapentin  200 mg Oral TID   insulin aspart  0-6 Units Subcutaneous TID WC   metoprolol succinate  25 mg Oral Daily   sodium  bicarbonate  650 mg Oral TID   sodium chloride flush  10-40 mL Intracatheter Q12H   tenofovir alafenamide  25 mg Oral Daily     Data Reviewed:   CBG:  Recent Labs  Lab 04/10/23 0730 04/10/23 1145 04/10/23 1650 04/10/23 2149 04/11/23 0736  GLUCAP 114*  168* 129* 174* 123*    SpO2: 94 %    Vitals:   04/10/23 0547 04/10/23 1319 04/10/23 2153 04/11/23 0512  BP: (!) 162/84 127/72 (!) 150/93 (!) 156/93  Pulse: (!) 52 72 83 64  Resp: 14 20 20    Temp: (!) 97.3 F (36.3 C) 97.9 F (36.6 C) 98.6 F (37 C) 98.2 F (36.8 C)  TempSrc: Oral Oral Oral Oral  SpO2: 91% 91% 96% 94%  Weight:      Height:          Data Reviewed:  Basic Metabolic Panel: Recent Labs  Lab 04/07/23 1016 04/08/23 0520 04/10/23 1625 04/11/23 0536  NA 142 141 140 140  K 3.9 4.0 3.3* 3.0*  CL 108 101 97* 92*  CO2 23 23 32 35*  GLUCOSE 102* 129* 152* 138*  BUN 8 11 28* 27*  CREATININE 0.94 0.88 0.83 0.98  CALCIUM 9.0 9.3 8.2* 8.0*  MG  --  1.7 2.1  --     CBC: Recent Labs  Lab 04/07/23 1016 04/08/23 0520 04/09/23 1910 04/10/23 1625 04/11/23 0536  WBC 5.1 7.6 9.4 9.8 7.0  NEUTROABS 2.4  --  8.2* 8.2* 5.6  HGB 14.4 14.7 13.5 14.3 14.1  HCT 43.1 42.6 40.9 43.9 42.3  MCV 90.4 88.6 90.5 92.0 91.2  PLT 156 178 165 195 170    LFT Recent Labs  Lab 04/07/23 1016 04/10/23 1625 04/11/23 0536  AST 40 83* 55*  ALT 59* 123* 105*  ALKPHOS 115 108 92  BILITOT 0.4 0.9 0.7  PROT 6.2* 6.1* 5.7*  ALBUMIN 3.6 3.6 3.4*     Antibiotics: Anti-infectives (From admission, onward)    Start     Dose/Rate Route Frequency Ordered Stop   04/07/23 1500  tenofovir alafenamide (VEMLIDY) tablet 25 mg        25 mg Oral Daily 04/07/23 1222          DVT prophylaxis: Lovenox  Code Status: Full code  Family Communication: No family at bedside   CONSULTS oncology, neurosurgery   Subjective    Still complains of burning pain left upper extremity.  Objective    Physical Examination:    Status is: Inpatient:    General-appears in no acute distress Heart-S1-S2, regular, no murmur auscultated Lungs-clear to auscultation bilaterally, no wheezing or crackles auscultated Abdomen-soft, nontender, no organomegaly Extremities-no edema in the lower  extremities Neuro-alert, oriented x3, no focal deficit noted      Tyler [name]   [facility] If 7PM-7AM, please contact night-coverage at [email or web address], Office  [phone]   04/11/2023, 10:31 AM  LOS: 4 days

## 2023-04-11 NOTE — Progress Notes (Addendum)
Tyler [name]   DOB:07/15/1950   WU#:[id number]      ASSESSMENT & PLAN:  1.  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  Patient preferred to proceed with more aggressive approach with standard treatment involving chemotherapy. - S/p craniotomy with gross total resection on 03/10/2023. - CT of head 04/07/2023 showed interval increase in size of tumor at the craniotomy site.  MRI of head 04/07/2023 increase in size of tumor with slightly increased surrounding edema. - Status post high-dose methotrexate on 04/09/2023.   - Sodium bicarb IV and p.o. started. - IV sodium bicarb decreased to 100 ml/hr as patient had slight shortness of breath noted 04/09/2023. - Continue to monitor urine pH daily.  Urine pH 8.0 today.  - Status post Lasix x 1 yesterday.  No sob noted today.    -Continue to monitor methotrexate daily until <0.1.  20.84 yesterday 04/10/2023.  Today's result remains pending.  - Medical oncology/Dr. Candise Che has been following closely   2.  Possible seizures - Patient complains of left-sided tingling sensation of lower extremities radiating to upper extremities, states starts at his fingers and toes and travels upwards. - Seen by neuro and EEG study done 04/10/2023.  Shows evidence of epileptogenicity arising from right parietal region.  No seizures were seen during the recording. - Recommend starting on Keppra 500 mg p.o. twice daily. - Plan CTA chest. - Dr. Candise Che following closely  3.  Altered mental status -Improving.  Alert and oriented today. - Likely due to brain mets - Continue supportive care and treatment plan as recommended by oncology.   4.  Elevated blood glucose  - Monitor blood sugar levels  closely. -Primary team following    5.  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  A-fib - Status post Eliquis.  Discontinued due to high risk for bleed. - Follow closely with cardiology   7.  Prostate cancer - Diagnosed 2021   8.  Hep B antibody positive, chronic - On tenofovir for hep B reactivation suppression in context of Rituxan use.  Plan is to continue for 6 months after his last chemotherapy. - Follow ID closely  Code Status Full    Subjective:  Patient seen awake and alert laying supine in bed.  Reports 3-4 episodes of left lower extremity left upper extremity which she states for a few seconds and goes away, describes as a burning sensation.  Denies headaches, dizziness, shortness of breath, or GI symptoms.  No other acute distress is noted.       Objective:  Vitals:   04/10/23 2153 04/11/23 0512  BP: (!) 150/93 (!) 156/93  Pulse: 83 64  Resp: 20   Temp: 98.6 F (37 C) 98.2 F (36.8 C)  SpO2: 96% 94%     Intake/Output Summary (Last 24 hours) at 04/11/2023 1109 Last data filed at 04/11/2023 1017 Gross per 24 hour  Intake 3337.1 ml  Output 3900 ml  Net -562.9 ml     REVIEW OF SYSTEMS:   Constitutional: + Left-sided weakness and tingling, denies fevers, chills or abnormal night sweats Eyes: Denies blurriness of vision, double vision or watery eyes Ears, nose, mouth, throat, and face: Denies mucositis or  sore throat Respiratory: Denies cough, dyspnea or wheezes Cardiovascular: Denies palpitation, chest discomfort or lower extremity swelling Gastrointestinal:  Denies nausea, heartburn or change in bowel habits Skin: Denies abnormal skin rashes Lymphatics: Denies new lymphadenopathy or easy bruising Neurological: Denies numbness, tingling or new weaknesses Behavioral/Psych: Mood is stable, no new changes  All other systems were reviewed with the patient and are negative.  PHYSICAL EXAMINATION: ECOG  PERFORMANCE STATUS: 2 - Symptomatic, <50% confined to bed  Vitals:   04/10/23 2153 04/11/23 0512  BP: (!) 150/93 (!) 156/93  Pulse: 83 64  Resp: 20   Temp: 98.6 F (37 C) 98.2 F (36.8 C)  SpO2: 96% 94%   Filed Weights   04/09/23 2030  Weight: 191 lb 8 oz (86.9 kg)    GENERAL: alert, no distress and comfortable SKIN: skin color, texture, turgor are normal, no rashes or significant lesions EYES: normal, conjunctiva are pink and non-injected, sclera clear OROPHARYNX: no exudate, no erythema and lips, buccal mucosa, and tongue normal  NECK: supple, thyroid normal size, non-tender, without nodularity LYMPH: no palpable lymphadenopathy in the cervical, axillary or inguinal LUNGS: clear to auscultation and percussion with normal breathing effort HEART: regular rate & rhythm and no murmurs and no lower extremity edema ABDOMEN: abdomen soft, non-tender and normal bowel sounds MUSCULOSKELETAL: no cyanosis of digits and no clubbing  PSYCH: alert & oriented x 3 with fluent speech NEURO: no focal motor/sensory deficits   All questions were answered. The patient knows to call the clinic with any problems, questions or concerns.   The total time spent in the appointment was 55 minutes encounter with patient including review of chart and various tests results, discussions about plan of care and coordination of care plan  Dawson Bills, NP 04/11/2023 11:09 AM    Labs Reviewed:  Lab Results  Component Value Date   WBC 7.0 04/11/2023   HGB 14.1 04/11/2023   HCT 42.3 04/11/2023   MCV 91.2 04/11/2023   PLT 170 04/11/2023   Recent Labs    08/11/22 0316 08/11/22 1544 08/12/22 0515 08/12/22 1400 08/20/22 0302 08/21/22 0334 04/07/23 1016 04/08/23 0520 04/10/23 1625 04/11/23 0536  NA 134*   < > 137   < > 137   < > 142 141 140 140  K 3.7   < > 3.3*   < > 4.0   < > 3.9 4.0 3.3* 3.0*  CL 99   < > 101   < > 102   < > 108 101 97* 92*  CO2 25   < > 28   < > 24   < > 23 23 32 35*   GLUCOSE 185*   < > 114*   < > 78   < > 102* 129* 152* 138*  BUN 52*   < > 41*   < > 45*   < > 8 11 28* 27*  CREATININE 1.58*   < > 1.44*   < > 3.38*   < > 0.94 0.88 0.83 0.98  CALCIUM 7.4*   < > 7.5*   < > 7.2*   < > 9.0 9.3 8.2* 8.0*  GFRNONAA 47*   < > 52*   < > 19*   < > >60 >60 >60 >60  PROT 4.6*  --  4.2*  --  3.9*   < > 6.2*  --  6.1* 5.7*  ALBUMIN 1.8*  1.7*   < > 1.7*  1.6*   < > 1.7*   < >  3.6  --  3.6 3.4*  AST 33  --  31  --  25   < > 40  --  83* 55*  ALT 31  --  31  --  18   < > 59*  --  123* 105*  ALKPHOS 84  --  79  --  111   < > 115  --  108 92  BILITOT 3.2*  --  2.5*  --  0.8   < > 0.4  --  0.9 0.7  BILIDIR 1.7*  --  1.3*  --  0.2  --   --   --   --   --   IBILI 1.5*  --  1.2*  --  0.6  --   --   --   --   --    < > = values in this interval not displayed.    Studies Reviewed:  DG CHEST PORT 1 VIEW Result Date: 04/10/2023 CLINICAL DATA:  Shortness of breath.  History of lymphoma EXAM: PORTABLE CHEST 1 VIEW COMPARISON:  X-ray 08/11/2022.  PET-CT 12/27/2022 FINDINGS: Right IJ chest port with tip overlying the right atrium. The port is accessed. Widening of the mediastinum is again seen. Enlarged cardiac silhouette. Basilar atelectasis or scar. No pneumothorax. Film is underinflated. No edema or effusion. Degenerative changes of the spine IMPRESSION: Underinflation.  Basilar atelectasis or scar. Widened mediastinum. Similar previous. Please correlate with prior PET-CT 12/27/2022 demonstrating prominent mediastinal fat. Chest port Electronically Signed   By: Karen Kays M.D.   On: 04/10/2023 19:17   EEG adult Result Date: 04/10/2023 Charlsie Quest, MD     04/11/2023  8:38 AM Patient Name: Autrey Human MRN: [id number] Epilepsy Attending: Charlsie Quest Referring Physician/Provider: Johney Maine, MD Date: 04/10/2023 Duration: 22.17 mins Patient history: 73yo M patient with rt parietal CNS lymphoma with intermittent left sided painful burning paresthesias and weakness.  EEG to evaluate for seizure Level of alertness: Awake, asleep AEDs during EEG study: None Technical aspects: This EEG study was done with scalp electrodes positioned according to the 10-20 International system of electrode placement. Electrical activity was reviewed with band pass filter of 1-70Hz , sensitivity of 7 uV/mm, display speed of 64mm/sec with a 60Hz  notched filter applied as appropriate. EEG data were recorded continuously and digitally stored.  Video monitoring was available and reviewed as appropriate. Description: The posterior dominant rhythm consists of 8-9 Hz activity of moderate voltage (25-35 uV) seen predominantly in posterior head regions, symmetric and reactive to eye opening and eye closing. Sleep was characterized by vertex waves, sleep spindles (12 to 14 Hz), maximal frontocentral region. EEG showed continuous sharply contoured 3 to 6 Hz theta-delta slowing in right parietal region consistent with breach artifact. Sharp waves were noted in right parietal region. Hyperventilation and photic stimulation were not performed.   ABNORMALITY - Sharp wave, right parietal region - Continuous slow,  right parietal region IMPRESSION: This study showed evidence of epileptogenicity arising from right parietal region. Additionally there is cortical dysfunction arising from right parietal region consistent with underlying craniotomy. No seizures were seen throughout the recording. Charlsie Quest   MR Brain W and Wo Contrast Result Date: 04/07/2023 CLINICAL DATA:  Provided history: Neuro deficit, persistent/recurrent, CNS neoplasm suspected. Diffuse large B-cell lymphoma. Left-sided weakness with midline shift on CT. EXAM: MRI HEAD WITHOUT AND WITH CONTRAST TECHNIQUE: Multiplanar, multiecho pulse sequences of the brain and surrounding structures were obtained without and with intravenous contrast. CONTRAST:  8mL  GADAVIST GADOBUTROL 1 MMOL/ML IV SOLN COMPARISON:  Head CT 04/07/2023.  Brain MRI 03/07/2023  neck FINDINGS: Intermittently motion degraded examination. Most notably, the axial T1 post-contrast sequence and coronal T2 sequence are moderately motion degraded. Within this limitation findings are as follows. Brain: Since the prior MRI 06/05/2023, there has been right parietal craniotomy for biopsy/debulking of an enhancing right parietal lobe tumor. Non-acute blood products at the operative site. The residual enhancing tumor measures 5.4 x 3.6 x 4.4 cm (previously 4.4 x 3.0 x 3.3 when remeasured on prior). Prominent surrounding edema slightly increased. 6 mm leftward midline shift has also increased (previously 4 mm). Persistent effacement of portions of the right lateral ventricle. Persistent asymmetric prominence of the right lateral ventricle temporal horn consistent with ventricular entrapment. Trace postoperative subdural collection along the anterior right cerebral hemisphere. Mild-to-moderate multifocal T2 FLAIR hyperintense signal abnormality elsewhere within the cerebral white matter, nonspecific but compatible with chronic small vessel ischemic disease. There is no acute infarct. Vascular: Maintained flow voids within the proximal large arterial vessels. Skull and upper cervical spine: Right parietal cranioplasty. No focal worrisome marrow lesion. Sinuses/orbits: No orbital mass or acute orbital finding.Mild mucosal thickening within the left maxillary sinus. IMPRESSION: 1. Motion degraded examination. 2. Since the prior brain MRI 03/07/2023, there has been right parietal craniotomy for biopsy/debulking of an enhancing right parietal lobe tumor. The residual tumor has increased in size, now measuring 5.4 x 3.6 x 4.4 cm (previously 4.4 x 3.0 x 3.3 cm). Prominent surrounding edema has slightly increased. 6 mm leftward midline shift has also increased. Unchanged asymmetric prominence of the right lateral ventricle temporal horn consistent with ventricular entrapment. Electronically Signed   By: Jackey Loge D.O.   On: 04/07/2023 14:01   CT Head Wo Contrast Result Date: 04/07/2023 CLINICAL DATA:  Facial paralysis/weakness (CN 7) EXAM: CT HEAD WITHOUT CONTRAST TECHNIQUE: Contiguous axial images were obtained from the base of the skull through the vertex without intravenous contrast. RADIATION DOSE REDUCTION: This exam was performed according to the departmental dose-optimization program which includes automated exposure control, adjustment of the mA and/or kV according to patient size and/or use of iterative reconstruction technique. COMPARISON:  Head CT 03/06/2023, brain MR 03/07/2023 FINDINGS: Brain: No hemorrhage. No hydrocephalus. No extra-axial fluid collection postsurgical changes from a right parietal craniotomy with interval increase in size of the residual/recurrent tumor at the craniotomy site, now measuring up to 4.7 x 2.7 cm, previously 3.2 x 2.5 cm, when measured in a similar orientation. There is increased leftward midline shift, now measuring up to 6 mm, previously 3 mm. Unchanged disproportionate enlargement of the right temporal horn, worrisome for ventricular entrapment. Vascular: No hyperdense vessel or unexpected calcification. Skull: Normal. Negative for fracture or focal lesion. Status post parietal craniotomy Sinuses/Orbits: No middle ear or mastoid effusion. Mucosal thickening in the bilateral maxillary sinuses, right-greater-than-left orbits are unremarkable. Other: None. IMPRESSION: 1. Postsurgical changes from a right parietal craniotomy with interval increase in size of the residual/recurrent tumor at the craniotomy site, now measuring up to 4.7 x 2.7 cm, previously 3.2 x 2.5 cm. There is increased leftward midline shift, now measuring up to 6 mm, previously 3 mm. Recommend further evaluation with brain MRI. 2. Unchanged asymmetric enlargement of the right temporal horn, compatible with ventricular entrapment Electronically Signed   By: Lorenza Cambridge M.D.   On: 04/07/2023 10:57      ADDENDUM  .Patient was Personally and independently interviewed, examined and relevant elements of the history of present  illness were reviewed in details and an assessment and plan was created. All elements of the patient's history of present illness , assessment and plan were discussed in details with **. The above documentation reflects our combined findings assessment and plan.   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  -Mehotrexate level 0.68 -- on leucovorin rescue q3h till levels <0.5 then q6h till methotrexate level<0.1 -SOB resolved, d dimer neg CXR WNL -continue bicarb drip urine ph 8 -started on keppra 500mg  po BID for partial seizures..may discontinue neurontin. -electrolyte replacement per hospitali medicine -- appreciate Dr Daune Perch help. -abnormal LFTs -- likely from Methotrexate -- improving -- avoid other hepatotoxic medications. -oncology will continue to follow. -discharge likely Monday or Tuesday. -pt/ot evaluation  Wyvonnia Lora MD MS

## 2023-04-12 DIAGNOSIS — Z5111 Encounter for antineoplastic chemotherapy: Secondary | ICD-10-CM | POA: Diagnosis not present

## 2023-04-12 DIAGNOSIS — R29898 Other symptoms and signs involving the musculoskeletal system: Secondary | ICD-10-CM | POA: Diagnosis not present

## 2023-04-12 DIAGNOSIS — R569 Unspecified convulsions: Secondary | ICD-10-CM | POA: Diagnosis not present

## 2023-04-12 DIAGNOSIS — D496 Neoplasm of unspecified behavior of brain: Secondary | ICD-10-CM | POA: Diagnosis not present

## 2023-04-12 DIAGNOSIS — I2699 Other pulmonary embolism without acute cor pulmonale: Secondary | ICD-10-CM | POA: Diagnosis not present

## 2023-04-12 DIAGNOSIS — C8339 Primary central nervous system lymphoma: Secondary | ICD-10-CM | POA: Diagnosis not present

## 2023-04-12 LAB — URINALYSIS, DIPSTICK ONLY
Bilirubin Urine: NEGATIVE
Glucose, UA: NEGATIVE mg/dL
Hgb urine dipstick: NEGATIVE
Ketones, ur: NEGATIVE mg/dL
Leukocytes,Ua: NEGATIVE
Nitrite: NEGATIVE
Protein, ur: NEGATIVE mg/dL
Specific Gravity, Urine: 1.006 (ref 1.005–1.030)
pH: 9 — ABNORMAL HIGH (ref 5.0–8.0)

## 2023-04-12 LAB — CBC WITH DIFFERENTIAL/PLATELET
Abs Immature Granulocytes: 0.03 10*3/uL (ref 0.00–0.07)
Basophils Absolute: 0 10*3/uL (ref 0.0–0.1)
Basophils Relative: 0 %
Eosinophils Absolute: 0 10*3/uL (ref 0.0–0.5)
Eosinophils Relative: 0 %
HCT: 39.9 % (ref 39.0–52.0)
Hemoglobin: 13 g/dL (ref 13.0–17.0)
Immature Granulocytes: 1 %
Lymphocytes Relative: 8 %
Lymphs Abs: 0.5 10*3/uL — ABNORMAL LOW (ref 0.7–4.0)
MCH: 30.2 pg (ref 26.0–34.0)
MCHC: 32.6 g/dL (ref 30.0–36.0)
MCV: 92.6 fL (ref 80.0–100.0)
Monocytes Absolute: 0.2 10*3/uL (ref 0.1–1.0)
Monocytes Relative: 4 %
Neutro Abs: 5 10*3/uL (ref 1.7–7.7)
Neutrophils Relative %: 87 %
Platelets: 174 10*3/uL (ref 150–400)
RBC: 4.31 MIL/uL (ref 4.22–5.81)
RDW: 12.2 % (ref 11.5–15.5)
WBC: 5.7 10*3/uL (ref 4.0–10.5)
nRBC: 0 % (ref 0.0–0.2)

## 2023-04-12 LAB — COMPREHENSIVE METABOLIC PANEL
ALT: 88 U/L — ABNORMAL HIGH (ref 0–44)
AST: 41 U/L (ref 15–41)
Albumin: 2.9 g/dL — ABNORMAL LOW (ref 3.5–5.0)
Alkaline Phosphatase: 77 U/L (ref 38–126)
Anion gap: 8 (ref 5–15)
BUN: 35 mg/dL — ABNORMAL HIGH (ref 8–23)
CO2: 37 mmol/L — ABNORMAL HIGH (ref 22–32)
Calcium: 7.9 mg/dL — ABNORMAL LOW (ref 8.9–10.3)
Chloride: 98 mmol/L (ref 98–111)
Creatinine, Ser: 1.39 mg/dL — ABNORMAL HIGH (ref 0.61–1.24)
GFR, Estimated: 55 mL/min — ABNORMAL LOW (ref >60)
Glucose, Bld: 137 mg/dL — ABNORMAL HIGH (ref 70–99)
Potassium: 3.4 mmol/L — ABNORMAL LOW (ref 3.5–5.1)
Sodium: 143 mmol/L (ref 135–145)
Total Bilirubin: 0.6 mg/dL (ref 0.0–1.2)
Total Protein: 5.2 g/dL — ABNORMAL LOW (ref 6.5–8.1)

## 2023-04-12 LAB — GLUCOSE, CAPILLARY
Glucose-Capillary: 123 mg/dL — ABNORMAL HIGH (ref 70–99)
Glucose-Capillary: 176 mg/dL — ABNORMAL HIGH (ref 70–99)
Glucose-Capillary: 114 mg/dL — ABNORMAL HIGH (ref 70–99)
Glucose-Capillary: 158 mg/dL — ABNORMAL HIGH (ref 70–99)

## 2023-04-12 LAB — METHOTREXATE: Methotrexate: 0.13

## 2023-04-12 MED ORDER — LEUCOVORIN CALCIUM INJECTION 100 MG
15.0000 mg | Freq: Four times a day (QID) | INTRAMUSCULAR | Status: AC
  Administered 2023-04-12 – 2023-04-13 (×4): 16 mg via INTRAVENOUS
  Filled 2023-04-12 (×4): qty 0.8

## 2023-04-12 MED ORDER — POTASSIUM CHLORIDE CRYS ER 20 MEQ PO TBCR
40.0000 meq | EXTENDED_RELEASE_TABLET | Freq: Once | ORAL | Status: AC
  Administered 2023-04-12: 40 meq via ORAL
  Filled 2023-04-12: qty 2

## 2023-04-12 NOTE — Progress Notes (Signed)
Mobility Specialist - Progress Note   04/12/23 1057  Mobility  Activity Ambulated with assistance in hallway  Level of Assistance Standby assist, set-up cues, supervision of patient - no hands on  Assistive Device Front wheel walker  Distance Ambulated (ft) 600 ft  Activity Response Tolerated well  Mobility Referral Yes  Mobility visit 1 Mobility  Mobility Specialist Start Time (ACUTE ONLY) 1026  Mobility Specialist Stop Time (ACUTE ONLY) 1057  Mobility Specialist Time Calculation (min) (ACUTE ONLY) 31 min   Pt received EOB and agreeable to mobility. No complaints during session. Pt to EOB after session with all needs met.   [facility]

## 2023-04-12 NOTE — Progress Notes (Signed)
[facility]  PROGRESS NOTE  Tyler [name] ZOX:[id number] DOB: 1950-11-27 DOA: 04/07/2023 PCP: Tyler Guard, FNP   Brief HPI:    73yo with h/o afib not on AC due to high bleeding risk, ETOH use d/o, prostate CA s/p prostatectomy, chronic Hep B on tenofovir, and high-grade B cell lymphoma with brain mets s/p parietal resection and complicated by severe pancytopenia and tumor lysis syndrome who presented on 2/10 with L-sided weakness. Neurosurgery was consulted and notes significant recurrence of high-grade lymphoma with recommendation to start chemo and/or rads expeditiously. Oncology is consulted.    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  Patient preferred to proceed with more aggressive approach with standard treatment involving chemotherapy Presented with worsening left-sided weakness  CT on 2/10 with interval increase in size of tumor at the craniotomy site MRI on 2/10 with increase in size of tumor with slightly increased surrounding edema Neurosurgery was consulted and recommends expedited chemo/rads therapy He was started on Decadron 6 mg q6h Oncology was consulted, patient was transferred to [facility] hospital for initiation of high-dose methotrexate -Sodium bicarb IV and p.o. started, need to achieve urine pH more than 7  -Urine pH daily, today urine pH is 8.0 -Methotrexate level 0.13; continue daily monitoring of methotrexate to less than 0.1 -Continue leucovorin injection every 6 hours -Urine pH 8.0 this morning -Continue sodium bicarb infusion -Oncology following  Hypoxia -Patient had mild hypoxia  yesterday when he was getting IV fluids at 150 mL/h. -Bicarb infusion was held overnight, this morning denies shortness of breath. -Chest x-ray portable ordered, no acute abnormality noted  Hypokalemia -Potassium is 3.4 -Place potassium and follow BMP in am  Hyperglycemia -Likely in setting of steroids -Continue very sensitive sliding scale with insulin -CBG well-controlled  ?  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 twice daily   H/o dysphagia This was an issue during his last hospitalization SLP consulted for diet recommendations (previously on dysphagia 3 diet)   Altered mental status Likely due to brain mets Continue supportive care and treatment plan as recommended by oncology.   A-fib No AC due to high bleeding risk Rate controlled with Toprol XL  History of bilateral pulmonary embolism Not on AC at this time He was hospitalized for a prolonged period of time last June with severe pancytopenia and tumor lysis syndrome; he was transfused about 15 units PRBC + platelets   Hypertension Continue Toprol XL   Prostate cancer Diagnosed 2021   Hep B antibody positive, chronic On tenofovir for hep B reactivation suppression in context of Rituxan use Plan is to continue for 6 months after his last chemotherapy. Follow ID closely Tenofovir may need to be held while on methotrexate - will defer to oncology   Gout Continue allopurinol prn   Neuropathic pain -Complain of left upper extremity neuropathic pain; little improvement with gabapentin -Was started on gabapentin for neuropathic pain -Gabapentin discontinued as patient started on Keppra for seizures as above   Medications     allopurinol  100 mg Oral BID   B-complex with vitamin C  1 tablet Oral Daily   Chlorhexidine Gluconate  Cloth  6 each Topical Daily   cholecalciferol  1,000 Units Oral Daily   dexamethasone (DECADRON)  injection  6 mg Intravenous Q12H   enoxaparin (LOVENOX) injection  30 mg Subcutaneous Q24H   folic acid  1 mg Oral Daily   insulin aspart  0-6 Units Subcutaneous TID WC   leucovorin  16 mg Intravenous Q3H   levETIRAcetam  500 mg Oral BID   metoprolol succinate  25 mg Oral Daily   sodium bicarbonate  650 mg Oral TID   sodium chloride flush  10-40 mL Intracatheter Q12H   tenofovir alafenamide  25 mg Oral Daily     Data Reviewed:   CBG:  Recent Labs  Lab 04/11/23 0736 04/11/23 1113 04/11/23 1616 04/11/23 2051 04/12/23 0731  GLUCAP 123* 179* 128* 167* 123*    SpO2: 93 %    Vitals:   04/11/23 1346 04/11/23 2053 04/12/23 0614 04/12/23 0926  BP: 123/80 (!) 143/86 (!) 145/91 (!) 147/86  Pulse: 76 62 (!) 59 69  Resp: 16 16 18    Temp: 98.2 F (36.8 C) 98.2 F (36.8 C) 98.3 F (36.8 C)   TempSrc: Oral Oral Oral   SpO2: 93% 98% 93% 93%  Weight:      Height:          Data Reviewed:  Basic Metabolic Panel: Recent Labs  Lab 04/07/23 1016 04/08/23 0520 04/10/23 1625 04/11/23 0536 04/12/23 0523  NA 142 141 140 140 143  K 3.9 4.0 3.3* 3.0* 3.4*  CL 108 101 97* 92* 98  CO2 23 23 32 35* 37*  GLUCOSE 102* 129* 152* 138* 137*  BUN 8 11 28* 27* 35*  CREATININE 0.94 0.88 0.83 0.98 1.39*  CALCIUM 9.0 9.3 8.2* 8.0* 7.9*  MG  --  1.7 2.1  --   --     CBC: Recent Labs  Lab 04/07/23 1016 04/08/23 0520 04/09/23 1910 04/10/23 1625 04/11/23 0536 04/12/23 0523  WBC 5.1 7.6 9.4 9.8 7.0 5.7  NEUTROABS 2.4  --  8.2* 8.2* 5.6 5.0  HGB 14.4 14.7 13.5 14.3 14.1 13.0  HCT 43.1 42.6 40.9 43.9 42.3 39.9  MCV 90.4 88.6 90.5 92.0 91.2 92.6  PLT 156 178 165 195 170 174    LFT Recent Labs  Lab 04/07/23 1016 04/10/23 1625 04/11/23 0536 04/12/23 0523  AST 40 83* 55* 41  ALT 59* 123* 105* 88*  ALKPHOS 115 108 92 77  BILITOT 0.4 0.9 0.7 0.6  PROT 6.2* 6.1* 5.7* 5.2*  ALBUMIN 3.6 3.6 3.4* 2.9*     Antibiotics: Anti-infectives (From admission, onward)    Start      Dose/Rate Route Frequency Ordered Stop   04/07/23 1500  tenofovir alafenamide (VEMLIDY) tablet 25 mg        25 mg Oral Daily 04/07/23 1222          DVT prophylaxis: Lovenox  Code Status: Full code  Family Communication: No family at bedside   CONSULTS oncology, neurosurgery   Subjective   No new complaints.   Objective    Physical Examination:  General-appears in no acute distress Heart-S1-S2, regular, no murmur auscultated Lungs-clear to auscultation bilaterally, no wheezing or crackles auscultated Abdomen-soft, nontender, no organomegaly Extremities-no edema in the lower extremities Neuro-alert, oriented x3, no focal deficit noted  Status is: Inpatient:         Meredeth Ide   [facility] If 7PM-7AM, please contact night-coverage at [email or web address], Office  [phone]   04/12/2023, 9:28 AM  LOS:  5 days

## 2023-04-12 NOTE — Plan of Care (Signed)
   Problem: Education: Goal: Knowledge of disease or condition will improve Outcome: Progressing Goal: Knowledge of secondary prevention will improve (MUST DOCUMENT ALL) Outcome: Progressing Goal: Knowledge of patient specific risk factors will improve (DELETE if not current risk factor) Outcome: Progressing   Problem: Ischemic Stroke/TIA Tissue Perfusion: Goal: Complications of ischemic stroke/TIA will be minimized Outcome: Progressing   Problem: Coping: Goal: Will verbalize positive feelings about self Outcome: Progressing Goal: Will identify appropriate support needs Outcome: Progressing

## 2023-04-12 NOTE — Plan of Care (Signed)
  Problem: Coping: Goal: Will verbalize positive feelings about self Outcome: Progressing   Problem: Health Behavior/Discharge Planning: Goal: Goals will be collaboratively established with patient/family Outcome: Progressing   Problem: Self-Care: Goal: Ability to participate in self-care as condition permits will improve Outcome: Progressing Goal: Ability to communicate needs accurately will improve Outcome: Progressing

## 2023-04-12 NOTE — Progress Notes (Signed)
[name]  HEMATOLOGY/ONCOLOGY INPATIENT PROGRESS NOTE  Date of Service: 04/12/2023  Inpatient Attending: .Meredeth Ide, MD   SUBJECTIVE  Patient was seen in medical oncology f/u today. He notes that he feels better. Only 2 very short lived episodes of partial seizure of left sided burning pain which he notes were much milder and better since starting Keppra. No other acute new symptoms. Notes dry mouth and was recommended to drink more water. No SOB no CP. No other acute new toxicities.    OBJECTIVE:  NAD  PHYSICAL EXAMINATION: . Vitals:   04/11/23 1346 04/11/23 2053 04/12/23 0614 04/12/23 0926  BP: 123/80 (!) 143/86 (!) 145/91 (!) 147/86  Pulse: 76 62 (!) 59 69  Resp: 16 16 18    Temp: 98.2 F (36.8 C) 98.2 F (36.8 C) 98.3 F (36.8 C)   TempSrc: Oral Oral Oral   SpO2: 93% 98% 93% 93%  Weight:      Height:       Filed Weights   04/09/23 2030  Weight: 191 lb 8 oz (86.9 kg)   .Body mass index is 29.99 kg/m.  GENERAL:alert, in no acute distress and comfortable OROPHARYNX:no exudate, no erythema and lips, buccal mucosa, and tongue normal  NECK: supple, no JVD, thyroid normal size, non-tender, without nodularity LYMPH:  no palpable lymphadenopathy in the cervical, axillary or inguinal LUNGS: clear to auscultation with normal respiratory effort HEART: regular rate & rhythm ABDOMEN: abdomen soft, non-tender, normoactive bowel sounds  Musculoskeletal: no cyanosis of digits and no clubbing  PSYCH: alert & oriented x 3 with fluent speech NEURO: no focal motor/sensory deficits  MEDICAL HISTORY:  Past Medical History:  Diagnosis Date   Cancer (HCC)    prostate   Follicular lymphoma (HCC) 2024   History of pulmonary embolism    Hypertension    Myelodysplastic syndrome (HCC)    PAF (paroxysmal atrial fibrillation) (HCC)    Recurrent UTI     SURGICAL HISTORY: Past Surgical History:  Procedure Laterality Date   APPLICATION OF CRANIAL NAVIGATION Right 03/10/2023    Procedure: APPLICATION OF CRANIAL NAVIGATION;  Surgeon: Tressie Stalker, MD;  Location: [facility] OR;  Service: Neurosurgery;  Laterality: Right;   BIOPSY  08/21/2022   Procedure: BIOPSY;  Surgeon: Beverley Fiedler, MD;  Location: [facility] ENDOSCOPY;  Service: Gastroenterology;;   CRANIOTOMY Right 03/10/2023   Procedure: PARIETAL CRANIOTOMY;  Surgeon: Tressie Stalker, MD;  Location: [facility] OR;  Service: Neurosurgery;  Laterality: Right;   ESOPHAGOGASTRODUODENOSCOPY (EGD) WITH PROPOFOL N/A 08/21/2022   Procedure: ESOPHAGOGASTRODUODENOSCOPY (EGD) WITH PROPOFOL;  Surgeon: Beverley Fiedler, MD;  Location: [facility] ENDOSCOPY;  Service: Gastroenterology;  Laterality: N/A;   IR IMAGING GUIDED PORT INSERTION  08/26/2022   LYMPHADENECTOMY Bilateral 09/01/2019   Procedure: LYMPHADENECTOMY;  Surgeon: Sebastian Ache, MD;  Location: [facility] ORS;  Service: Urology;  Laterality: Bilateral;   ROBOT ASSISTED LAPAROSCOPIC RADICAL PROSTATECTOMY N/A 09/01/2019   Procedure: XI ROBOTIC ASSISTED LAPAROSCOPIC RADICAL PROSTATECTOMY;  Surgeon: Sebastian Ache, MD;  Location: [facility] ORS;  Service: Urology;  Laterality: N/A;  3 HRS    SOCIAL HISTORY: Social History   Socioeconomic History   Marital status: Married    Spouse name: Not on file   Number of children: Not on file   Years of education: Not on file   Highest education level: Not on file  Occupational History   Not on file  Tobacco Use   Smoking status: Never   Smokeless tobacco: Never  Vaping Use   Vaping status: Never Used  Substance  and Sexual Activity   Alcohol use: Yes    Comment: at least one 40 oz beer daily   Drug use: Never   Sexual activity: Not on file  Other Topics Concern   Not on file  Social History Narrative   Not on file   Social Drivers of Health   Financial Resource Strain: Not on File (06/14/2021)   Received from [facility], [place]   Financial Resource Strain    Financial Resource Strain: 0  Food Insecurity: No Food Insecurity (04/07/2023)   Hunger Vital Sign     Worried About Running Out of Food in the Last Year: Never true    Ran Out of Food in the Last Year: Never true  Transportation Needs: No Transportation Needs (04/07/2023)   PRAPARE - Administrator, Civil Service (Medical): No    Lack of Transportation (Non-Medical): No  Physical Activity: Not on File (06/14/2021)   Received from [place], [place]   Physical Activity    Physical Activity: 0  Stress: Not on File (06/14/2021)   Received from [facility], [place]   Stress    Stress: 0  Social Connections: Moderately Isolated (04/07/2023)   Social Connection and Isolation Panel [NHANES]    Frequency of Communication with Friends and Family: More than three times a week    Frequency of Social Gatherings with Friends and Family: Once a week    Attends Religious Services: Never    Database administrator or Organizations: No    Attends Banker Meetings: Never    Marital Status: Married  Catering manager Violence: Not At Risk (04/07/2023)   Humiliation, Afraid, Rape, and Kick questionnaire    Fear of Current or Ex-Partner: No    Emotionally Abused: No    Physically Abused: No    Sexually Abused: No    FAMILY HISTORY: Family History  Problem Relation Age of Onset   Stroke Father     ALLERGIES:  is allergic to amitriptyline, amlodipine, and neurontin [gabapentin].  MEDICATIONS:  Scheduled Meds:  allopurinol  100 mg Oral BID   B-complex with vitamin C  1 tablet Oral Daily   Chlorhexidine Gluconate Cloth  6 each Topical Daily   cholecalciferol  1,000 Units Oral Daily   dexamethasone (DECADRON) injection  6 mg Intravenous Q12H   enoxaparin (LOVENOX) injection  30 mg Subcutaneous Q24H   folic acid  1 mg Oral Daily   insulin aspart  0-6 Units Subcutaneous TID WC   leucovorin  16 mg Intravenous Q6H   levETIRAcetam  500 mg Oral BID   metoprolol succinate  25 mg Oral Daily   sodium bicarbonate  650 mg Oral TID   sodium chloride flush  10-40 mL Intracatheter Q12H   tenofovir  alafenamide  25 mg Oral Daily   Continuous Infusions:  sodium bicarbonate 150 mEq in dextrose 5 % 1,150 mL infusion 100 mL/hr at 04/12/23 0659   PRN Meds:.acetaminophen **OR** acetaminophen (TYLENOL) oral liquid 160 mg/5 mL **OR** acetaminophen, morphine injection, ondansetron, oxyCODONE, sodium chloride flush  REVIEW OF SYSTEMS:    10 Point review of Systems was done is negative except as noted above.   LABORATORY DATA:  I have reviewed the data as listed  .    Latest Ref Rng & Units 04/12/2023    5:23 AM 04/11/2023    5:36 AM 04/10/2023    4:25 PM  CBC  WBC 4.0 - 10.5 K/uL 5.7  7.0  9.8   Hemoglobin 13.0 - 17.0 g/dL  13.0  14.1  14.3   Hematocrit 39.0 - 52.0 % 39.9  42.3  43.9   Platelets 150 - 400 K/uL 174  170  195     .    Latest Ref Rng & Units 04/12/2023    5:23 AM 04/11/2023    5:36 AM 04/10/2023    4:25 PM  CMP  Glucose 70 - 99 mg/dL 161  096  045   BUN 8 - 23 mg/dL 35  27  28   Creatinine 0.61 - 1.24 mg/dL 4.09  8.11  9.14   Sodium 135 - 145 mmol/L 143  140  140   Potassium 3.5 - 5.1 mmol/L 3.4  3.0  3.3   Chloride 98 - 111 mmol/L 98  92  97   CO2 22 - 32 mmol/L 37  35  32   Calcium 8.9 - 10.3 mg/dL 7.9  8.0  8.2   Total Protein 6.5 - 8.1 g/dL 5.2  5.7  6.1   Total Bilirubin 0.0 - 1.2 mg/dL 0.6  0.7  0.9   Alkaline Phos 38 - 126 U/L 77  92  108   AST 15 - 41 U/L 41  55  83   ALT 0 - 44 U/L 88  105  123      RADIOGRAPHIC STUDIES: I have personally reviewed the radiological images as listed and agreed with the findings in the report. DG CHEST PORT 1 VIEW Result Date: 04/10/2023 CLINICAL DATA:  Shortness of breath.  History of lymphoma EXAM: PORTABLE CHEST 1 VIEW COMPARISON:  X-ray 08/11/2022.  PET-CT 12/27/2022 FINDINGS: Right IJ chest port with tip overlying the right atrium. The port is accessed. Widening of the mediastinum is again seen. Enlarged cardiac silhouette. Basilar atelectasis or scar. No pneumothorax. Film is underinflated. No edema or effusion.  Degenerative changes of the spine IMPRESSION: Underinflation.  Basilar atelectasis or scar. Widened mediastinum. Similar previous. Please correlate with prior PET-CT 12/27/2022 demonstrating prominent mediastinal fat. Chest port Electronically Signed   By: Karen Kays M.D.   On: 04/10/2023 19:17   EEG adult Result Date: 04/10/2023 Charlsie Quest, MD     04/11/2023  8:38 AM Patient Name: Tyler [name] MRN: [id number] Epilepsy Attending: Charlsie Quest Referring Physician/Provider: Johney Maine, MD Date: 04/10/2023 Duration: 22.17 mins Patient history: 73yo M patient with rt parietal CNS lymphoma with intermittent left sided painful burning paresthesias and weakness. EEG to evaluate for seizure Level of alertness: Awake, asleep AEDs during EEG study: None Technical aspects: This EEG study was done with scalp electrodes positioned according to the 10-20 International system of electrode placement. Electrical activity was reviewed with band pass filter of 1-70Hz , sensitivity of 7 uV/mm, display speed of 80mm/sec with a 60Hz  notched filter applied as appropriate. EEG data were recorded continuously and digitally stored.  Video monitoring was available and reviewed as appropriate. Description: The posterior dominant rhythm consists of 8-9 Hz activity of moderate voltage (25-35 uV) seen predominantly in posterior head regions, symmetric and reactive to eye opening and eye closing. Sleep was characterized by vertex waves, sleep spindles (12 to 14 Hz), maximal frontocentral region. EEG showed continuous sharply contoured 3 to 6 Hz theta-delta slowing in right parietal region consistent with breach artifact. Sharp waves were noted in right parietal region. Hyperventilation and photic stimulation were not performed.   ABNORMALITY - Sharp wave, right parietal region - Continuous slow,  right parietal region IMPRESSION: This study showed evidence of epileptogenicity arising from right parietal region.  Additionally  there is cortical dysfunction arising from right parietal region consistent with underlying craniotomy. No seizures were seen throughout the recording. Charlsie Quest   MR Brain W and Wo Contrast Result Date: 04/07/2023 CLINICAL DATA:  Provided history: Neuro deficit, persistent/recurrent, CNS neoplasm suspected. Diffuse large B-cell lymphoma. Left-sided weakness with midline shift on CT. EXAM: MRI HEAD WITHOUT AND WITH CONTRAST TECHNIQUE: Multiplanar, multiecho pulse sequences of the brain and surrounding structures were obtained without and with intravenous contrast. CONTRAST:  8mL GADAVIST GADOBUTROL 1 MMOL/ML IV SOLN COMPARISON:  Head CT 04/07/2023.  Brain MRI 03/07/2023 neck FINDINGS: Intermittently motion degraded examination. Most notably, the axial T1 post-contrast sequence and coronal T2 sequence are moderately motion degraded. Within this limitation findings are as follows. Brain: Since the prior MRI 06/05/2023, there has been right parietal craniotomy for biopsy/debulking of an enhancing right parietal lobe tumor. Non-acute blood products at the operative site. The residual enhancing tumor measures 5.4 x 3.6 x 4.4 cm (previously 4.4 x 3.0 x 3.3 when remeasured on prior). Prominent surrounding edema slightly increased. 6 mm leftward midline shift has also increased (previously 4 mm). Persistent effacement of portions of the right lateral ventricle. Persistent asymmetric prominence of the right lateral ventricle temporal horn consistent with ventricular entrapment. Trace postoperative subdural collection along the anterior right cerebral hemisphere. Mild-to-moderate multifocal T2 FLAIR hyperintense signal abnormality elsewhere within the cerebral white matter, nonspecific but compatible with chronic small vessel ischemic disease. There is no acute infarct. Vascular: Maintained flow voids within the proximal large arterial vessels. Skull and upper cervical spine: Right parietal cranioplasty. No focal  worrisome marrow lesion. Sinuses/orbits: No orbital mass or acute orbital finding.Mild mucosal thickening within the left maxillary sinus. IMPRESSION: 1. Motion degraded examination. 2. Since the prior brain MRI 03/07/2023, there has been right parietal craniotomy for biopsy/debulking of an enhancing right parietal lobe tumor. The residual tumor has increased in size, now measuring 5.4 x 3.6 x 4.4 cm (previously 4.4 x 3.0 x 3.3 cm). Prominent surrounding edema has slightly increased. 6 mm leftward midline shift has also increased. Unchanged asymmetric prominence of the right lateral ventricle temporal horn consistent with ventricular entrapment. Electronically Signed   By: Jackey Loge D.O.   On: 04/07/2023 14:01   CT Head Wo Contrast Result Date: 04/07/2023 CLINICAL DATA:  Facial paralysis/weakness (CN 7) EXAM: CT HEAD WITHOUT CONTRAST TECHNIQUE: Contiguous axial images were obtained from the base of the skull through the vertex without intravenous contrast. RADIATION DOSE REDUCTION: This exam was performed according to the departmental dose-optimization program which includes automated exposure control, adjustment of the mA and/or kV according to patient size and/or use of iterative reconstruction technique. COMPARISON:  Head CT 03/06/2023, brain MR 03/07/2023 FINDINGS: Brain: No hemorrhage. No hydrocephalus. No extra-axial fluid collection postsurgical changes from a right parietal craniotomy with interval increase in size of the residual/recurrent tumor at the craniotomy site, now measuring up to 4.7 x 2.7 cm, previously 3.2 x 2.5 cm, when measured in a similar orientation. There is increased leftward midline shift, now measuring up to 6 mm, previously 3 mm. Unchanged disproportionate enlargement of the right temporal horn, worrisome for ventricular entrapment. Vascular: No hyperdense vessel or unexpected calcification. Skull: Normal. Negative for fracture or focal lesion. Status post parietal craniotomy  Sinuses/Orbits: No middle ear or mastoid effusion. Mucosal thickening in the bilateral maxillary sinuses, right-greater-than-left orbits are unremarkable. Other: None. IMPRESSION: 1. Postsurgical changes from a right parietal craniotomy with interval increase in size of the residual/recurrent tumor at  the craniotomy site, now measuring up to 4.7 x 2.7 cm, previously 3.2 x 2.5 cm. There is increased leftward midline shift, now measuring up to 6 mm, previously 3 mm. Recommend further evaluation with brain MRI. 2. Unchanged asymmetric enlargement of the right temporal horn, compatible with ventricular entrapment Electronically Signed   By: Lorenza Cambridge M.D.   On: 04/07/2023 10:57    ASSESSMENT & PLAN:   1.  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  Patient preferred to proceed with more aggressive approach with standard treatment involving chemotherapy. - S/p craniotomy with gross total resection on 03/10/2023. - CT of head 04/07/2023 showed interval increase in size of tumor at the craniotomy site.  MRI of head 04/07/2023 increase in size of tumor with slightly increased surrounding edema. - Status post high-dose methotrexate on 04/09/2023.   - Sodium bicarb IV and p.o. started. - IV sodium bicarb decreased to 100 ml/hr as patient had slight shortness of breath noted 04/09/2023. - Continue to monitor urine pH daily.  Urine pH 8.0 today.  - Status post Lasix x 1 yesterday.  No sob noted today.    -Continue to monitor methotrexate daily until <0.1.    2.  Possible seizures - Patient complains of left-sided tingling and burning sensation consistent with partial sensory seizures- Seen by neuro and EEG study done 04/10/2023.   Shows evidence of epileptogenicity arising from right parietal region.  No seizures were seen during the recording. -improved symptoms - Continue on Keppra 500 mg p.o. twice daily.   3.  Altered mental status -Improving.  Alert and oriented today. - Likely due to CNS lymphoma with edema - Continue supportive care and treatment plan as recommended by oncology.   4.  Elevated blood glucose  - Monitor blood sugar levels closely. -Primary team following    5.  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  A-fib - Status post Eliquis.  Discontinued due to high risk for bleed. - Follow closely with cardiology   7.  Prostate cancer - Diagnosed 2021   8.  Hep B antibody positive, chronic - On tenofovir for hep B reactivation suppression in context of Rituxan use.  Plan is to continue for 6 months after his last chemotherapy. - Follow ID closely   Code Status Full   PLAN  -methotrexate levels today 0.13...okay to reduce IV Leucovorin to q6h for the next 24hours -Blood counts stable LFTs improving. -Mild creatinine bump ?some foleys blockage with some pericatheter leak. Will discontinue foleys catheter and encourage patient to increase po fluid intake. -will continue ZOfran/Dex premedication till Methotrexate levels <0.1 and will plan to discharge home on Dexamethasone 4mg  BID with breakfast and lunch for 1 week and then 4mg  po daily with breakfast till C2 of HD methotrexate. -appreciate excellent hospital medicine cares -hope to discharge patient on Monday if all is good. -PT/OT to determine discharge needs.  .The total time spent in the appointment was 35 minutes* .  All of the patient's questions were answered with apparent satisfaction. The patient knows to call the clinic with any problems, questions or concerns.   Wyvonnia Lora MD MS AAHIVMS [facility] Hematology/Oncology Physician [facility]  .*Total Encounter  Time as defined by the Centers for Medicare and Medicaid Services includes, in addition to the face-to-face time of a patient visit (documented in the note above) non-face-to-face time: obtaining and reviewing outside history, ordering and reviewing medications, tests or procedures, care coordination (communications with other health care professionals or caregivers) and documentation in the medical record.   04/12/2023 11:59 AM

## 2023-04-13 DIAGNOSIS — Z5111 Encounter for antineoplastic chemotherapy: Secondary | ICD-10-CM | POA: Diagnosis not present

## 2023-04-13 DIAGNOSIS — C8339 Primary central nervous system lymphoma: Secondary | ICD-10-CM | POA: Diagnosis not present

## 2023-04-13 DIAGNOSIS — R569 Unspecified convulsions: Secondary | ICD-10-CM | POA: Diagnosis not present

## 2023-04-13 LAB — URINALYSIS, DIPSTICK ONLY
Bilirubin Urine: NEGATIVE
Glucose, UA: NEGATIVE mg/dL
Hgb urine dipstick: NEGATIVE
Ketones, ur: NEGATIVE mg/dL
Leukocytes,Ua: NEGATIVE
Nitrite: NEGATIVE
Protein, ur: NEGATIVE mg/dL
Specific Gravity, Urine: 1.009 (ref 1.005–1.030)
pH: 9 — ABNORMAL HIGH (ref 5.0–8.0)

## 2023-04-13 LAB — CBC WITH DIFFERENTIAL/PLATELET
Abs Immature Granulocytes: 0.02 10*3/uL (ref 0.00–0.07)
Basophils Absolute: 0 10*3/uL (ref 0.0–0.1)
Basophils Relative: 0 %
Eosinophils Absolute: 0 10*3/uL (ref 0.0–0.5)
Eosinophils Relative: 0 %
HCT: 43.2 % (ref 39.0–52.0)
Hemoglobin: 14 g/dL (ref 13.0–17.0)
Immature Granulocytes: 0 %
Lymphocytes Relative: 9 %
Lymphs Abs: 0.5 10*3/uL — ABNORMAL LOW (ref 0.7–4.0)
MCH: 29.6 pg (ref 26.0–34.0)
MCHC: 32.4 g/dL (ref 30.0–36.0)
MCV: 91.3 fL (ref 80.0–100.0)
Monocytes Absolute: 0 10*3/uL — ABNORMAL LOW (ref 0.1–1.0)
Monocytes Relative: 1 %
Neutro Abs: 4.4 10*3/uL (ref 1.7–7.7)
Neutrophils Relative %: 90 %
Platelets: 158 10*3/uL (ref 150–400)
RBC: 4.73 MIL/uL (ref 4.22–5.81)
RDW: 12.2 % (ref 11.5–15.5)
WBC: 4.9 10*3/uL (ref 4.0–10.5)
nRBC: 0.4 % — ABNORMAL HIGH (ref 0.0–0.2)

## 2023-04-13 LAB — GLUCOSE, CAPILLARY
Glucose-Capillary: 130 mg/dL — ABNORMAL HIGH (ref 70–99)
Glucose-Capillary: 161 mg/dL — ABNORMAL HIGH (ref 70–99)
Glucose-Capillary: 124 mg/dL — ABNORMAL HIGH (ref 70–99)

## 2023-04-13 LAB — COMPREHENSIVE METABOLIC PANEL
ALT: 81 U/L — ABNORMAL HIGH (ref 0–44)
AST: 48 U/L — ABNORMAL HIGH (ref 15–41)
Albumin: 3.2 g/dL — ABNORMAL LOW (ref 3.5–5.0)
Alkaline Phosphatase: 80 U/L (ref 38–126)
Anion gap: 12 (ref 5–15)
BUN: 32 mg/dL — ABNORMAL HIGH (ref 8–23)
CO2: 31 mmol/L (ref 22–32)
Calcium: 8.1 mg/dL — ABNORMAL LOW (ref 8.9–10.3)
Chloride: 96 mmol/L — ABNORMAL LOW (ref 98–111)
Creatinine, Ser: 1.22 mg/dL (ref 0.61–1.24)
GFR, Estimated: >60 mL/min (ref >60)
Glucose, Bld: 180 mg/dL — ABNORMAL HIGH (ref 70–99)
Potassium: 3.1 mmol/L — ABNORMAL LOW (ref 3.5–5.1)
Sodium: 139 mmol/L (ref 135–145)
Total Bilirubin: 1 mg/dL (ref 0.0–1.2)
Total Protein: 5.5 g/dL — ABNORMAL LOW (ref 6.5–8.1)

## 2023-04-13 LAB — METHOTREXATE: Methotrexate: <0.05

## 2023-04-13 MED ORDER — POTASSIUM CHLORIDE CRYS ER 20 MEQ PO TBCR
40.0000 meq | EXTENDED_RELEASE_TABLET | ORAL | Status: AC
  Administered 2023-04-13 – 2023-04-14 (×3): 40 meq via ORAL
  Filled 2023-04-13 (×3): qty 2

## 2023-04-13 NOTE — Progress Notes (Signed)
Patient's port needle came out. Contacted IV team to look at site. Site was edematous. IV team stated ok to re-access. Will re-access port.

## 2023-04-13 NOTE — Plan of Care (Signed)
   Problem: Education: Goal: Knowledge of disease or condition will improve Outcome: Progressing Goal: Knowledge of secondary prevention will improve (MUST DOCUMENT ALL) Outcome: Progressing Goal: Knowledge of patient specific risk factors will improve (DELETE if not current risk factor) Outcome: Progressing   Problem: Ischemic Stroke/TIA Tissue Perfusion: Goal: Complications of ischemic stroke/TIA will be minimized Outcome: Progressing   Problem: Coping: Goal: Will verbalize positive feelings about self Outcome: Progressing Goal: Will identify appropriate support needs Outcome: Progressing   Problem: Health Behavior/Discharge Planning: Goal: Ability to manage health-related needs will improve Outcome: Progressing Goal: Goals will be collaboratively established with patient/family Outcome: Progressing   Problem: Self-Care: Goal: Ability to participate in self-care as condition permits will improve Outcome: Progressing Goal: Verbalization of feelings and concerns over difficulty with self-care will improve Outcome: Progressing Goal: Ability to communicate needs accurately will improve Outcome: Progressing   Problem: Nutrition: Goal: Risk of aspiration will decrease Outcome: Progressing Goal: Dietary intake will improve Outcome: Progressing   Problem: Education: Goal: Knowledge of General Education information will improve Description: Including pain rating scale, medication(s)/side effects and non-pharmacologic comfort measures Outcome: Progressing   Problem: Health Behavior/Discharge Planning: Goal: Ability to manage health-related needs will improve Outcome: Progressing   Problem: Clinical Measurements: Goal: Ability to maintain clinical measurements within normal limits will improve Outcome: Progressing Goal: Will remain free from infection Outcome: Progressing Goal: Diagnostic test results will improve Outcome: Progressing Goal: Respiratory complications will  improve Outcome: Progressing Goal: Cardiovascular complication will be avoided Outcome: Progressing   Problem: Activity: Goal: Risk for activity intolerance will decrease Outcome: Progressing   Problem: Nutrition: Goal: Adequate nutrition will be maintained Outcome: Progressing   Problem: Coping: Goal: Level of anxiety will decrease Outcome: Progressing   Problem: Elimination: Goal: Will not experience complications related to bowel motility Outcome: Progressing Goal: Will not experience complications related to urinary retention Outcome: Progressing   Problem: Pain Managment: Goal: General experience of comfort will improve and/or be controlled Outcome: Progressing   Problem: Safety: Goal: Ability to remain free from injury will improve Outcome: Progressing   Problem: Skin Integrity: Goal: Risk for impaired skin integrity will decrease Outcome: Progressing   Problem: Education: Goal: Ability to describe self-care measures that may prevent or decrease complications (Diabetes Survival Skills Education) will improve Outcome: Progressing Goal: Individualized Educational Video(s) Outcome: Progressing   Problem: Coping: Goal: Ability to adjust to condition or change in health will improve Outcome: Progressing   Problem: Fluid Volume: Goal: Ability to maintain a balanced intake and output will improve Outcome: Progressing   Problem: Health Behavior/Discharge Planning: Goal: Ability to identify and utilize available resources and services will improve Outcome: Progressing Goal: Ability to manage health-related needs will improve Outcome: Progressing   Problem: Metabolic: Goal: Ability to maintain appropriate glucose levels will improve Outcome: Progressing   Problem: Nutritional: Goal: Maintenance of adequate nutrition will improve Outcome: Progressing Goal: Progress toward achieving an optimal weight will improve Outcome: Progressing   Problem: Skin  Integrity: Goal: Risk for impaired skin integrity will decrease Outcome: Progressing   Problem: Tissue Perfusion: Goal: Adequacy of tissue perfusion will improve Outcome: Progressing

## 2023-04-13 NOTE — Progress Notes (Signed)
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 Neurosurgery was consulted and notes significant recurrence of high-grade lymphoma with recommendation to start chemo and/or rads expeditiously. Oncology is consulted.    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  Patient preferred to proceed with more aggressive approach with standard treatment involving chemotherapy Presented with worsening left-sided weakness  CT on 2/10 with interval increase in size of tumor at the craniotomy site MRI on 2/10 with increase in size of tumor with slightly increased surrounding edema Neurosurgery was consulted and recommends expedited chemo/rads therapy He was started on Decadron 6 mg q6h Oncology was consulted, patient was transferred to [facility] long hospital for initiation of high-dose methotrexate -Sodium bicarb IV and p.o. started, need to achieve urine pH more than 7  -Urine pH daily, today urine pH is 8.0 -Methotrexate level 0.13; continue daily monitoring of methotrexate to less than 0.1 -Continue leucovorin injection every 6 hours -Urine pH 8.0 this morning -Continue sodium bicarb infusion -Oncology following  Hypoxia -Patient had mild hypoxia  yesterday when he was getting IV fluids at 150 mL/h. -Bicarb infusion was held overnight, this morning denies shortness of breath. -Chest x-ray portable ordered, no acute abnormality noted  Hypokalemia -Potassium is 3.1 Will replace potassium and follow BMP in am  Hyperglycemia -Likely in setting of steroids -Continue very sensitive sliding scale with insulin -CBG well-controlled  ?  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 twice daily   H/o dysphagia This was an issue during his last hospitalization SLP consulted for diet recommendations (previously on dysphagia 3 diet)   Altered mental status Likely due to brain mets Continue supportive care and treatment plan as recommended by oncology.   A-fib No AC due to high bleeding risk Rate controlled with Toprol XL  History of bilateral pulmonary embolism Not on AC at this time He was hospitalized for a prolonged period of time last June with severe pancytopenia and tumor lysis syndrome; he was transfused about 15 units PRBC + platelets   Hypertension Continue Toprol XL   Prostate cancer Diagnosed 2021   Hep B antibody positive, chronic On tenofovir for hep B reactivation suppression in context of Rituxan use Plan is to continue for 6 months after his last chemotherapy. Follow ID closely Tenofovir may need to be held while on methotrexate - will defer to oncology   Gout Continue allopurinol prn   Neuropathic pain -Complain of left upper extremity neuropathic pain; little improvement with gabapentin -Was started on gabapentin for neuropathic pain -Gabapentin discontinued as patient started on Keppra for seizures as above   Medications     allopurinol  100 mg Oral BID   B-complex with vitamin C  1 tablet Oral Daily   Chlorhexidine  Gluconate Cloth  6 each Topical Daily   cholecalciferol  1,000 Units Oral Daily   dexamethasone  (DECADRON) injection  6 mg Intravenous Q12H   enoxaparin (LOVENOX) injection  30 mg Subcutaneous Q24H   folic acid  1 mg Oral Daily   insulin aspart  0-6 Units Subcutaneous TID WC   levETIRAcetam  500 mg Oral BID   metoprolol succinate  25 mg Oral Daily   potassium chloride  40 mEq Oral Q4H   sodium bicarbonate  650 mg Oral TID   sodium chloride flush  10-40 mL Intracatheter Q12H   tenofovir alafenamide  25 mg Oral Daily     Data Reviewed:   CBG:  Recent Labs  Lab 04/12/23 1107 04/12/23 1625 04/12/23 2139 04/13/23 0723 04/13/23 1153  GLUCAP 176* 114* 158* 130* 161*    SpO2: 95 %    Vitals:   04/12/23 2134 04/13/23 0510 04/13/23 0957 04/13/23 1352  BP: (!) 154/94 127/89 131/86 123/82  Pulse: (!) 59 61 73 72  Resp: 20 20 16 18   Temp: 98.5 F (36.9 C) 97.9 F (36.6 C) 97.8 F (36.6 C) 98.3 F (36.8 C)  TempSrc: Oral Oral Oral Oral  SpO2: 92% 98% 98% 95%  Weight:      Height:          Data Reviewed:  Basic Metabolic Panel: Recent Labs  Lab 04/08/23 0520 04/10/23 1625 04/11/23 0536 04/12/23 0523 04/13/23 1047  NA 141 140 140 143 139  K 4.0 3.3* 3.0* 3.4* 3.1*  CL 101 97* 92* 98 96*  CO2 23 32 35* 37* 31  GLUCOSE 129* 152* 138* 137* 180*  BUN 11 28* 27* 35* 32*  CREATININE 0.88 0.83 0.98 1.39* 1.22  CALCIUM 9.3 8.2* 8.0* 7.9* 8.1*  MG 1.7 2.1  --   --   --     CBC: Recent Labs  Lab 04/09/23 1910 04/10/23 1625 04/11/23 0536 04/12/23 0523 04/13/23 1047  WBC 9.4 9.8 7.0 5.7 4.9  NEUTROABS 8.2* 8.2* 5.6 5.0 4.4  HGB 13.5 14.3 14.1 13.0 14.0  HCT 40.9 43.9 42.3 39.9 43.2  MCV 90.5 92.0 91.2 92.6 91.3  PLT 165 195 170 174 158    LFT Recent Labs  Lab 04/07/23 1016 04/10/23 1625 04/11/23 0536 04/12/23 0523 04/13/23 1047  AST 40 83* 55* 41 48*  ALT 59* 123* 105* 88* 81*  ALKPHOS 115 108 92 77 80  BILITOT 0.4 0.9 0.7 0.6 1.0  PROT 6.2* 6.1* 5.7* 5.2* 5.5*  ALBUMIN 3.6 3.6 3.4* 2.9* 3.2*     Antibiotics: Anti-infectives (From  admission, onward)    Start     Dose/Rate Route Frequency Ordered Stop   04/07/23 1500  tenofovir alafenamide (VEMLIDY) tablet 25 mg        25 mg Oral Daily 04/07/23 1222          DVT prophylaxis: Lovenox  Code Status: Full code  Family Communication: No family at bedside   CONSULTS oncology, neurosurgery   Subjective   Denies any complaints   Objective    Physical Examination:  General-appears in no acute distress Heart-S1-S2, regular, no murmur auscultated Lungs-clear to auscultation bilaterally, no wheezing or crackles auscultated Abdomen-soft, nontender, no organomegaly Extremities-no edema in the lower extremities Neuro-alert, oriented x3, no focal deficit noted  Status is: Inpatient:         Meredeth Ide   [facility] If 7PM-7AM, please contact night-coverage at [email or web address], Office  [phone]   04/13/2023, 3:09 PM  LOS: 6 days

## 2023-04-14 ENCOUNTER — Encounter (HOSPITAL_COMMUNITY): Payer: Self-pay

## 2023-04-14 DIAGNOSIS — C8339 Primary central nervous system lymphoma: Secondary | ICD-10-CM | POA: Diagnosis not present

## 2023-04-14 DIAGNOSIS — R569 Unspecified convulsions: Secondary | ICD-10-CM | POA: Diagnosis not present

## 2023-04-14 DIAGNOSIS — R29898 Other symptoms and signs involving the musculoskeletal system: Secondary | ICD-10-CM | POA: Diagnosis not present

## 2023-04-14 DIAGNOSIS — Z5111 Encounter for antineoplastic chemotherapy: Secondary | ICD-10-CM | POA: Diagnosis not present

## 2023-04-14 DIAGNOSIS — I2699 Other pulmonary embolism without acute cor pulmonale: Secondary | ICD-10-CM | POA: Diagnosis not present

## 2023-04-14 LAB — COMPREHENSIVE METABOLIC PANEL
ALT: 64 U/L — ABNORMAL HIGH (ref 0–44)
AST: 29 U/L (ref 15–41)
Albumin: 2.7 g/dL — ABNORMAL LOW (ref 3.5–5.0)
Alkaline Phosphatase: 68 U/L (ref 38–126)
Anion gap: 9 (ref 5–15)
BUN: 28 mg/dL — ABNORMAL HIGH (ref 8–23)
CO2: 29 mmol/L (ref 22–32)
Calcium: 7.9 mg/dL — ABNORMAL LOW (ref 8.9–10.3)
Chloride: 99 mmol/L (ref 98–111)
Creatinine, Ser: 0.91 mg/dL (ref 0.61–1.24)
GFR, Estimated: >60 mL/min (ref >60)
Glucose, Bld: 134 mg/dL — ABNORMAL HIGH (ref 70–99)
Potassium: 4.3 mmol/L (ref 3.5–5.1)
Sodium: 137 mmol/L (ref 135–145)
Total Bilirubin: 0.8 mg/dL (ref 0.0–1.2)
Total Protein: 4.9 g/dL — ABNORMAL LOW (ref 6.5–8.1)

## 2023-04-14 LAB — CBC WITH DIFFERENTIAL/PLATELET
Abs Immature Granulocytes: 0.04 10*3/uL (ref 0.00–0.07)
Basophils Absolute: 0 10*3/uL (ref 0.0–0.1)
Basophils Relative: 0 %
Eosinophils Absolute: 0 10*3/uL (ref 0.0–0.5)
Eosinophils Relative: 0 %
HCT: 40.8 % (ref 39.0–52.0)
Hemoglobin: 13.2 g/dL (ref 13.0–17.0)
Immature Granulocytes: 1 %
Lymphocytes Relative: 13 %
Lymphs Abs: 0.6 10*3/uL — ABNORMAL LOW (ref 0.7–4.0)
MCH: 29.9 pg (ref 26.0–34.0)
MCHC: 32.4 g/dL (ref 30.0–36.0)
MCV: 92.3 fL (ref 80.0–100.0)
Monocytes Absolute: 0.1 10*3/uL (ref 0.1–1.0)
Monocytes Relative: 2 %
Neutro Abs: 3.8 10*3/uL (ref 1.7–7.7)
Neutrophils Relative %: 84 %
Platelets: 137 10*3/uL — ABNORMAL LOW (ref 150–400)
RBC: 4.42 MIL/uL (ref 4.22–5.81)
RDW: 12 % (ref 11.5–15.5)
WBC: 4.5 10*3/uL (ref 4.0–10.5)
nRBC: 0 % (ref 0.0–0.2)

## 2023-04-14 LAB — URINALYSIS, DIPSTICK ONLY
Bilirubin Urine: NEGATIVE
Glucose, UA: NEGATIVE mg/dL
Hgb urine dipstick: NEGATIVE
Ketones, ur: NEGATIVE mg/dL
Leukocytes,Ua: NEGATIVE
Nitrite: NEGATIVE
Protein, ur: NEGATIVE mg/dL
Specific Gravity, Urine: 1.013 (ref 1.005–1.030)
pH: 9 — ABNORMAL HIGH (ref 5.0–8.0)

## 2023-04-14 LAB — GLUCOSE, CAPILLARY
Glucose-Capillary: 144 mg/dL — ABNORMAL HIGH (ref 70–99)
Glucose-Capillary: 120 mg/dL — ABNORMAL HIGH (ref 70–99)
Glucose-Capillary: 194 mg/dL — ABNORMAL HIGH (ref 70–99)
Glucose-Capillary: 127 mg/dL — ABNORMAL HIGH (ref 70–99)

## 2023-04-14 MED ORDER — LEVETIRACETAM 500 MG PO TABS: 500.0000 mg | ORAL_TABLET | Freq: Two times a day (BID) | ORAL | 1 refills | Status: DC

## 2023-04-14 MED ORDER — HEPARIN SOD (PORK) LOCK FLUSH 100 UNIT/ML IV SOLN
500.0000 [IU] | Freq: Once | INTRAVENOUS | Status: AC
  Administered 2023-04-14: 500 [IU] via INTRAVENOUS
  Filled 2023-04-14: qty 5

## 2023-04-14 MED ORDER — DEXAMETHASONE 4 MG PO TABS: 4.0000 mg | ORAL_TABLET | Freq: Two times a day (BID) | ORAL | 0 refills | Status: DC

## 2023-04-14 NOTE — Progress Notes (Signed)
[name]  HEMATOLOGY/ONCOLOGY INPATIENT PROGRESS NOTE  Date of Service: 04/13/2023  Inpatient Attending: .Meredeth Ide, MD   SUBJECTIVE  Patient  was seen in medical oncology f/u. He notes that he is feeling better. Good po intake and good po hydration. Left sided sensory sz activity only once and very short lived today. No nausea/vomitting/shortness of breath or chest pain.    OBJECTIVE:  NAD  PHYSICAL EXAMINATION: . Vitals:   04/13/23 0510 04/13/23 0957 04/13/23 1352 04/13/23 2148  BP: 127/89 131/86 123/82 (!) 158/104  Pulse: 61 73 72 (!) 56  Resp: 20 16 18 20   Temp: 97.9 F (36.6 C) 97.8 F (36.6 C) 98.3 F (36.8 C) 98.1 F (36.7 C)  TempSrc: Oral Oral Oral Oral  SpO2: 98% 98% 95% 96%  Weight:      Height:       Filed Weights   04/09/23 2030  Weight: 191 lb 8 oz (86.9 kg)   .Body mass index is 29.99 kg/m.  GENERAL:alert, in no acute distress and comfortable OROPHARYNX:no exudate, no erythema and lips, buccal mucosa, and tongue normal  NECK: supple, no JVD, thyroid normal size, non-tender, without nodularity LYMPH:  no palpable lymphadenopathy in the cervical, axillary or inguinal LUNGS: clear to auscultation with normal respiratory effort HEART: regular rate & rhythm ABDOMEN: abdomen soft, non-tender, normoactive bowel sounds  Musculoskeletal: no cyanosis of digits and no clubbing  PSYCH: alert & oriented x 3 with fluent speech NEURO: no focal motor/sensory deficits  MEDICAL HISTORY:  Past Medical History:  Diagnosis Date   Cancer (HCC)    prostate   Follicular lymphoma (HCC) 2024   History of pulmonary embolism    Hypertension    Myelodysplastic syndrome (HCC)    PAF (paroxysmal atrial fibrillation) (HCC)    Recurrent UTI     SURGICAL HISTORY: Past Surgical History:  Procedure Laterality Date   APPLICATION OF CRANIAL NAVIGATION Right 03/10/2023   Procedure: APPLICATION OF CRANIAL NAVIGATION;  Surgeon: Tressie Stalker, MD;  Location: [facility] OR;   Service: Neurosurgery;  Laterality: Right;   BIOPSY  08/21/2022   Procedure: BIOPSY;  Surgeon: Beverley Fiedler, MD;  Location: [facility] ENDOSCOPY;  Service: Gastroenterology;;   CRANIOTOMY Right 03/10/2023   Procedure: PARIETAL CRANIOTOMY;  Surgeon: Tressie Stalker, MD;  Location: [facility] OR;  Service: Neurosurgery;  Laterality: Right;   ESOPHAGOGASTRODUODENOSCOPY (EGD) WITH PROPOFOL N/A 08/21/2022   Procedure: ESOPHAGOGASTRODUODENOSCOPY (EGD) WITH PROPOFOL;  Surgeon: Beverley Fiedler, MD;  Location: [facility] ENDOSCOPY;  Service: Gastroenterology;  Laterality: N/A;   IR IMAGING GUIDED PORT INSERTION  08/26/2022   LYMPHADENECTOMY Bilateral 09/01/2019   Procedure: LYMPHADENECTOMY;  Surgeon: Sebastian Ache, MD;  Location: [facility] ORS;  Service: Urology;  Laterality: Bilateral;   ROBOT ASSISTED LAPAROSCOPIC RADICAL PROSTATECTOMY N/A 09/01/2019   Procedure: XI ROBOTIC ASSISTED LAPAROSCOPIC RADICAL PROSTATECTOMY;  Surgeon: Sebastian Ache, MD;  Location: [facility] ORS;  Service: Urology;  Laterality: N/A;  3 HRS    SOCIAL HISTORY: Social History   Socioeconomic History   Marital status: Married    Spouse name: Not on file   Number of children: Not on file   Years of education: Not on file   Highest education level: Not on file  Occupational History   Not on file  Tobacco Use   Smoking status: Never   Smokeless tobacco: Never  Vaping Use   Vaping status: Never Used  Substance and Sexual Activity   Alcohol use: Yes    Comment: at least one 40 oz beer daily  Drug use: Never   Sexual activity: Not on file  Other Topics Concern   Not on file  Social History Narrative   Not on file   Social Drivers of Health   Financial Resource Strain: Not on File (06/14/2021)   Received from [facility], [place] Strain    Financial Resource Strain: 0  Food Insecurity: No Food Insecurity (04/07/2023)   Hunger Vital Sign    Worried About Running Out of Food in the Last Year: Never true    Ran Out of Food in the Last  Year: Never true  Transportation Needs: No Transportation Needs (04/07/2023)   PRAPARE - Administrator, Civil Service (Medical): No    Lack of Transportation (Non-Medical): No  Physical Activity: Not on File (06/14/2021)   Received from [place], [place]   Physical Activity    Physical Activity: 0  Stress: Not on File (06/14/2021)   Received from [facility], [place]   Stress    Stress: 0  Social Connections: Moderately Isolated (04/07/2023)   Social Connection and Isolation Panel [NHANES]    Frequency of Communication with Friends and Family: More than three times a week    Frequency of Social Gatherings with Friends and Family: Once a week    Attends Religious Services: Never    Database administrator or Organizations: No    Attends Banker Meetings: Never    Marital Status: Married  Catering manager Violence: Not At Risk (04/07/2023)   Humiliation, Afraid, Rape, and Kick questionnaire    Fear of Current or Ex-Partner: No    Emotionally Abused: No    Physically Abused: No    Sexually Abused: No    FAMILY HISTORY: Family History  Problem Relation Age of Onset   Stroke Father     ALLERGIES:  is allergic to amitriptyline, amlodipine, and neurontin [gabapentin].  MEDICATIONS:  Scheduled Meds:  allopurinol  100 mg Oral BID   B-complex with vitamin C  1 tablet Oral Daily   Chlorhexidine Gluconate Cloth  6 each Topical Daily   cholecalciferol  1,000 Units Oral Daily   dexamethasone (DECADRON) injection  6 mg Intravenous Q12H   enoxaparin (LOVENOX) injection  30 mg Subcutaneous Q24H   folic acid  1 mg Oral Daily   insulin aspart  0-6 Units Subcutaneous TID WC   levETIRAcetam  500 mg Oral BID   metoprolol succinate  25 mg Oral Daily   sodium bicarbonate  650 mg Oral TID   sodium chloride flush  10-40 mL Intracatheter Q12H   tenofovir alafenamide  25 mg Oral Daily   Continuous Infusions:  sodium bicarbonate 150 mEq in dextrose 5 % 1,150 mL infusion 100 mL/hr at  04/13/23 2009   PRN Meds:.acetaminophen **OR** acetaminophen (TYLENOL) oral liquid 160 mg/5 mL **OR** acetaminophen, morphine injection, ondansetron, oxyCODONE, sodium chloride flush  REVIEW OF SYSTEMS:    10 Point review of Systems was done is negative except as noted above.   LABORATORY DATA:  I have reviewed the data as listed  .    Latest Ref Rng & Units 04/13/2023   10:47 AM 04/12/2023    5:23 AM 04/11/2023    5:36 AM  CBC  WBC 4.0 - 10.5 K/uL 4.9  5.7  7.0   Hemoglobin 13.0 - 17.0 g/dL 91.4  78.2  95.6   Hematocrit 39.0 - 52.0 % 43.2  39.9  42.3   Platelets 150 - 400 K/uL 158  174  170     .  Latest Ref Rng & Units 04/13/2023   10:47 AM 04/12/2023    5:23 AM 04/11/2023    5:36 AM  CMP  Glucose 70 - 99 mg/dL 161  096  045   BUN 8 - 23 mg/dL 32  35  27   Creatinine 0.61 - 1.24 mg/dL 4.09  8.11  9.14   Sodium 135 - 145 mmol/L 139  143  140   Potassium 3.5 - 5.1 mmol/L 3.1  3.4  3.0   Chloride 98 - 111 mmol/L 96  98  92   CO2 22 - 32 mmol/L 31  37  35   Calcium 8.9 - 10.3 mg/dL 8.1  7.9  8.0   Total Protein 6.5 - 8.1 g/dL 5.5  5.2  5.7   Total Bilirubin 0.0 - 1.2 mg/dL 1.0  0.6  0.7   Alkaline Phos 38 - 126 U/L 80  77  92   AST 15 - 41 U/L 48  41  55   ALT 0 - 44 U/L 81  88  105      RADIOGRAPHIC STUDIES: I have personally reviewed the radiological images as listed and agreed with the findings in the report. DG CHEST PORT 1 VIEW Result Date: 04/10/2023 CLINICAL DATA:  Shortness of breath.  History of lymphoma EXAM: PORTABLE CHEST 1 VIEW COMPARISON:  X-ray 08/11/2022.  PET-CT 12/27/2022 FINDINGS: Right IJ chest port with tip overlying the right atrium. The port is accessed. Widening of the mediastinum is again seen. Enlarged cardiac silhouette. Basilar atelectasis or scar. No pneumothorax. Film is underinflated. No edema or effusion. Degenerative changes of the spine IMPRESSION: Underinflation.  Basilar atelectasis or scar. Widened mediastinum. Similar previous. Please  correlate with prior PET-CT 12/27/2022 demonstrating prominent mediastinal fat. Chest port Electronically Signed   By: Karen Kays M.D.   On: 04/10/2023 19:17   EEG adult Result Date: 04/10/2023 Charlsie Quest, MD     04/11/2023  8:38 AM Patient Name: Tyler [name] MRN: [id number] Epilepsy Attending: Charlsie Quest Referring Physician/Provider: Johney Maine, MD Date: 04/10/2023 Duration: 22.17 mins Patient history: 73yo M patient with rt parietal CNS lymphoma with intermittent left sided painful burning paresthesias and weakness. EEG to evaluate for seizure Level of alertness: Awake, asleep AEDs during EEG study: None Technical aspects: This EEG study was done with scalp electrodes positioned according to the 10-20 International system of electrode placement. Electrical activity was reviewed with band pass filter of 1-70Hz , sensitivity of 7 uV/mm, display speed of 51mm/sec with a 60Hz  notched filter applied as appropriate. EEG data were recorded continuously and digitally stored.  Video monitoring was available and reviewed as appropriate. Description: The posterior dominant rhythm consists of 8-9 Hz activity of moderate voltage (25-35 uV) seen predominantly in posterior head regions, symmetric and reactive to eye opening and eye closing. Sleep was characterized by vertex waves, sleep spindles (12 to 14 Hz), maximal frontocentral region. EEG showed continuous sharply contoured 3 to 6 Hz theta-delta slowing in right parietal region consistent with breach artifact. Sharp waves were noted in right parietal region. Hyperventilation and photic stimulation were not performed.   ABNORMALITY - Sharp wave, right parietal region - Continuous slow,  right parietal region IMPRESSION: This study showed evidence of epileptogenicity arising from right parietal region. Additionally there is cortical dysfunction arising from right parietal region consistent with underlying craniotomy. No seizures were seen throughout  the recording. Charlsie Quest   MR Brain W and Wo Contrast Result Date: 04/07/2023 CLINICAL DATA:  Provided history: Neuro deficit, persistent/recurrent, CNS neoplasm suspected. Diffuse large B-cell lymphoma. Left-sided weakness with midline shift on CT. EXAM: MRI HEAD WITHOUT AND WITH CONTRAST TECHNIQUE: Multiplanar, multiecho pulse sequences of the brain and surrounding structures were obtained without and with intravenous contrast. CONTRAST:  8mL GADAVIST GADOBUTROL 1 MMOL/ML IV SOLN COMPARISON:  Head CT 04/07/2023.  Brain MRI 03/07/2023 neck FINDINGS: Intermittently motion degraded examination. Most notably, the axial T1 post-contrast sequence and coronal T2 sequence are moderately motion degraded. Within this limitation findings are as follows. Brain: Since the prior MRI 06/05/2023, there has been right parietal craniotomy for biopsy/debulking of an enhancing right parietal lobe tumor. Non-acute blood products at the operative site. The residual enhancing tumor measures 5.4 x 3.6 x 4.4 cm (previously 4.4 x 3.0 x 3.3 when remeasured on prior). Prominent surrounding edema slightly increased. 6 mm leftward midline shift has also increased (previously 4 mm). Persistent effacement of portions of the right lateral ventricle. Persistent asymmetric prominence of the right lateral ventricle temporal horn consistent with ventricular entrapment. Trace postoperative subdural collection along the anterior right cerebral hemisphere. Mild-to-moderate multifocal T2 FLAIR hyperintense signal abnormality elsewhere within the cerebral white matter, nonspecific but compatible with chronic small vessel ischemic disease. There is no acute infarct. Vascular: Maintained flow voids within the proximal large arterial vessels. Skull and upper cervical spine: Right parietal cranioplasty. No focal worrisome marrow lesion. Sinuses/orbits: No orbital mass or acute orbital finding.Mild mucosal thickening within the left maxillary sinus.  IMPRESSION: 1. Motion degraded examination. 2. Since the prior brain MRI 03/07/2023, there has been right parietal craniotomy for biopsy/debulking of an enhancing right parietal lobe tumor. The residual tumor has increased in size, now measuring 5.4 x 3.6 x 4.4 cm (previously 4.4 x 3.0 x 3.3 cm). Prominent surrounding edema has slightly increased. 6 mm leftward midline shift has also increased. Unchanged asymmetric prominence of the right lateral ventricle temporal horn consistent with ventricular entrapment. Electronically Signed   By: Jackey Loge D.O.   On: 04/07/2023 14:01   CT Head Wo Contrast Result Date: 04/07/2023 CLINICAL DATA:  Facial paralysis/weakness (CN 7) EXAM: CT HEAD WITHOUT CONTRAST TECHNIQUE: Contiguous axial images were obtained from the base of the skull through the vertex without intravenous contrast. RADIATION DOSE REDUCTION: This exam was performed according to the departmental dose-optimization program which includes automated exposure control, adjustment of the mA and/or kV according to patient size and/or use of iterative reconstruction technique. COMPARISON:  Head CT 03/06/2023, brain MR 03/07/2023 FINDINGS: Brain: No hemorrhage. No hydrocephalus. No extra-axial fluid collection postsurgical changes from a right parietal craniotomy with interval increase in size of the residual/recurrent tumor at the craniotomy site, now measuring up to 4.7 x 2.7 cm, previously 3.2 x 2.5 cm, when measured in a similar orientation. There is increased leftward midline shift, now measuring up to 6 mm, previously 3 mm. Unchanged disproportionate enlargement of the right temporal horn, worrisome for ventricular entrapment. Vascular: No hyperdense vessel or unexpected calcification. Skull: Normal. Negative for fracture or focal lesion. Status post parietal craniotomy Sinuses/Orbits: No middle ear or mastoid effusion. Mucosal thickening in the bilateral maxillary sinuses, right-greater-than-left orbits are  unremarkable. Other: None. IMPRESSION: 1. Postsurgical changes from a right parietal craniotomy with interval increase in size of the residual/recurrent tumor at the craniotomy site, now measuring up to 4.7 x 2.7 cm, previously 3.2 x 2.5 cm. There is increased leftward midline shift, now measuring up to 6 mm, previously 3 mm. Recommend further evaluation with brain MRI. 2. Unchanged  asymmetric enlargement of the right temporal horn, compatible with ventricular entrapment Electronically Signed   By: Lorenza Cambridge M.D.   On: 04/07/2023 10:57    ASSESSMENT & PLAN:   1.  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  Patient preferred to proceed with more aggressive approach with standard treatment involving chemotherapy. - S/p craniotomy with gross total resection on 03/10/2023. - CT of head 04/07/2023 showed interval increase in size of tumor at the craniotomy site.  MRI of head 04/07/2023 increase in size of tumor with slightly increased surrounding edema. - Status post high-dose methotrexate on 04/09/2023.   - Sodium bicarb IV and p.o. started. - IV sodium bicarb decreased to 100 ml/hr as patient had slight shortness of breath noted 04/09/2023. - Continue to monitor urine pH daily.  Urine pH 8.0 today.  - Status post Lasix x 1 yesterday.  No sob noted today.    -Continue to monitor methotrexate daily until <0.1.    2.  Possible seizures - Patient complains of left-sided tingling and burning sensation consistent with partial sensory seizures- Seen by neuro and EEG study done 04/10/2023.  Shows evidence of epileptogenicity arising from right parietal region.  No seizures were seen during the recording. -improved symptoms -  Continue on Keppra 500 mg p.o. twice daily.   3.  Altered mental status -Improving.  Alert and oriented today. - Likely due to CNS lymphoma with edema - Continue supportive care and treatment plan as recommended by oncology.   4.  Elevated blood glucose  - Monitor blood sugar levels closely. -Primary team following    5.  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  A-fib - Status post Eliquis.  Discontinued due to high risk for bleed. - Follow closely with cardiology   7.  Prostate cancer - Diagnosed 2021   8.  Hep B antibody positive, chronic - On tenofovir for hep B reactivation suppression in context of Rituxan use.  Plan is to continue for 6 months after his last chemotherapy. - Follow ID closely   Code Status Full   PLAN  -methotrexate levels today <0.05 - okay to hold bicarb drip -Blood counts stable LFTs improving. -Mild creatinine bump ?some foleys blockage with some pericatheter leak. Will discontinue foleys catheter and encourage patient to increase po fluid intake. -will continue ZOfran/Dex premedication till Methotrexate levels <0.1 and will plan to discharge home on Dexamethasone 4mg  BID with breakfast and lunch for 1 week and then 4mg  po daily with breakfast till C2 of HD methotrexate. -appreciate excellent hospital medicine cares -hope to discharge patient on Monday if all is good. -continue Keppra 500mg  po BID -PT/OT to determine discharge needs.  .The total time spent in the appointment was 25 minutes* .  All of the patient's questions were answered with apparent satisfaction. The patient knows to call the clinic with any problems, questions or concerns.   Wyvonnia Lora MD MS AAHIVMS [facility] Hematology/Oncology Physician [facility]  .*Total Encounter Time as defined by the Centers for Medicare and Medicaid Services includes, in addition to the face-to-face time of a patient visit  (documented in the note above) non-face-to-face time: obtaining and reviewing outside  history, ordering and reviewing medications, tests or procedures, care coordination (communications with other health care professionals or caregivers) and documentation in the medical record.

## 2023-04-14 NOTE — Plan of Care (Signed)
   Problem: Education: Goal: Knowledge of disease or condition will improve Outcome: Progressing Goal: Knowledge of secondary prevention will improve (MUST DOCUMENT ALL) Outcome: Progressing Goal: Knowledge of patient specific risk factors will improve (DELETE if not current risk factor) Outcome: Progressing   Problem: Ischemic Stroke/TIA Tissue Perfusion: Goal: Complications of ischemic stroke/TIA will be minimized Outcome: Progressing   Problem: Coping: Goal: Will verbalize positive feelings about self Outcome: Progressing Goal: Will identify appropriate support needs Outcome: Progressing   Problem: Health Behavior/Discharge Planning: Goal: Ability to manage health-related needs will improve Outcome: Progressing Goal: Goals will be collaboratively established with patient/family Outcome: Progressing   Problem: Self-Care: Goal: Ability to participate in self-care as condition permits will improve Outcome: Progressing Goal: Verbalization of feelings and concerns over difficulty with self-care will improve Outcome: Progressing Goal: Ability to communicate needs accurately will improve Outcome: Progressing   Problem: Nutrition: Goal: Risk of aspiration will decrease Outcome: Progressing Goal: Dietary intake will improve Outcome: Progressing   Problem: Education: Goal: Knowledge of General Education information will improve Description: Including pain rating scale, medication(s)/side effects and non-pharmacologic comfort measures Outcome: Progressing   Problem: Health Behavior/Discharge Planning: Goal: Ability to manage health-related needs will improve Outcome: Progressing   Problem: Clinical Measurements: Goal: Ability to maintain clinical measurements within normal limits will improve Outcome: Progressing Goal: Will remain free from infection Outcome: Progressing Goal: Diagnostic test results will improve Outcome: Progressing Goal: Respiratory complications will  improve Outcome: Progressing Goal: Cardiovascular complication will be avoided Outcome: Progressing   Problem: Activity: Goal: Risk for activity intolerance will decrease Outcome: Progressing   Problem: Nutrition: Goal: Adequate nutrition will be maintained Outcome: Progressing   Problem: Coping: Goal: Level of anxiety will decrease Outcome: Progressing   Problem: Elimination: Goal: Will not experience complications related to bowel motility Outcome: Progressing Goal: Will not experience complications related to urinary retention Outcome: Progressing   Problem: Pain Managment: Goal: General experience of comfort will improve and/or be controlled Outcome: Progressing   Problem: Safety: Goal: Ability to remain free from injury will improve Outcome: Progressing   Problem: Skin Integrity: Goal: Risk for impaired skin integrity will decrease Outcome: Progressing   Problem: Education: Goal: Ability to describe self-care measures that may prevent or decrease complications (Diabetes Survival Skills Education) will improve Outcome: Progressing Goal: Individualized Educational Video(s) Outcome: Progressing   Problem: Coping: Goal: Ability to adjust to condition or change in health will improve Outcome: Progressing   Problem: Fluid Volume: Goal: Ability to maintain a balanced intake and output will improve Outcome: Progressing   Problem: Health Behavior/Discharge Planning: Goal: Ability to identify and utilize available resources and services will improve Outcome: Progressing Goal: Ability to manage health-related needs will improve Outcome: Progressing   Problem: Metabolic: Goal: Ability to maintain appropriate glucose levels will improve Outcome: Progressing   Problem: Nutritional: Goal: Maintenance of adequate nutrition will improve Outcome: Progressing Goal: Progress toward achieving an optimal weight will improve Outcome: Progressing   Problem: Skin  Integrity: Goal: Risk for impaired skin integrity will decrease Outcome: Progressing   Problem: Tissue Perfusion: Goal: Adequacy of tissue perfusion will improve Outcome: Progressing

## 2023-04-14 NOTE — Progress Notes (Signed)
Physical Therapy Treatment Patient Details Name: Tyler [name] MRN: [id number] DOB: [birth year] Today's Date: 04/14/2023   History of Present Illness 73 yo male presents to [facility] on 2/10 with LUE and LE weakness, and change in mentation. Head CT of brain shows a recurrent tumor at the craniotomy site and increased size of recurrent tumor along with a left midline shift up to 6 mm. 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    PT Comments   Pt admitted with above diagnosis.  Pt currently with functional limitations due to the deficits listed below (see PT Problem List). Pt in bed when PT arrived. Pt agreeable to therapy intervention. Pt reported needed to use bathroom. Pt is mod I for bed mobility, S for transfer tasks with RW and or IV pole with min cues, gait assessed with RW from bed to commode with S and returned to EOB with use of IV pole, no apparent LOB, no noted ataxia, no reports of pain, dizziness or SOB. PT is under impression pt to d/c today. Pt declined further tx intervention due to timing of lunch tray, pt left seated EOB and set up for lunch with all needs in place. Pt d/c plan remains appropriate. Pt will benefit from acute skilled PT to increase their independence and safety with mobility to allow discharge.      If plan is discharge home, recommend the following: A little help with bathing/dressing/bathroom;Assistance with cooking/housework;Direct supervision/assist for medications management;Direct supervision/assist for financial management;Assist for transportation;Help with stairs or ramp for entrance;Supervision due to cognitive status   Can travel by private vehicle     Yes  Equipment Recommendations  None recommended by PT    Recommendations for Other  Services Rehab consult     Precautions / Restrictions Precautions Precautions: Fall Recall of Precautions/Restrictions: Impaired Precaution/Restrictions Comments: crani 1/13 Restrictions Weight Bearing Restrictions Per Provider Order: No     Mobility  Bed Mobility Overal bed mobility: Modified Independent                  Transfers Overall transfer level: Needs assistance Equipment used: Rolling walker (2 wheels) Transfers: Sit to/from Stand, Bed to chair/wheelchair/BSC Sit to Stand: Supervision   Step pivot transfers: Supervision       General transfer comment: pt performed transfer to the commode with use of RW and return use of IV pole for stabiltiy    Ambulation/Gait Ambulation/Gait assistance: Supervision Gait Distance (Feet): 15 Feet Assistive device: Rolling walker (2 wheels), IV Pole Gait Pattern/deviations: Step-through pattern, Drifts right/left Gait velocity: decreased     General Gait Details: no noted ataxia with gait tasks, use of RW bed to commode and return with use of IV pole no overt LOB, pt declined further gait distances at this time due to lunch arrival   Stairs             Wheelchair Mobility     Tilt Bed    Modified Rankin (Stroke Patients Only)       Balance Overall balance assessment: Needs assistance Sitting-balance support: Feet supported, No upper extremity supported Sitting balance-Leahy Scale: Good     Standing balance support: Single extremity supported, During functional activity Standing balance-Leahy Scale: Good  Communication Communication Communication: No apparent difficulties Factors Affecting Communication: Other (comment);Reduced clarity of speech (Broken English,)  Cognition Arousal: Alert Behavior During Therapy: WFL for tasks assessed/performed   PT - Cognitive impairments: No apparent impairments                         Following commands:  Intact Following commands impaired: Follows one step commands with increased time    Cueing Cueing Techniques: Verbal cues, Gestural cues  Exercises      General Comments        Pertinent Vitals/Pain Pain Assessment Pain Assessment: No/denies pain (no report of indication of pain) Breathing: normal Negative Vocalization: none Facial Expression: smiling or inexpressive Body Language: relaxed Consolability: no need to console PAINAD Score: 0 Pain Descriptors / Indicators: Burning, Sharp, Shooting, Spasm    Home Living                          Prior Function            PT Goals (current goals can now be found in the care plan section) Acute Rehab PT Goals Patient Stated Goal: none stated PT Goal Formulation: With patient Time For Goal Achievement: 03/22/23 Potential to Achieve Goals: Good Progress towards PT goals: Progressing toward goals    Frequency    Min 1X/week      PT Plan      Co-evaluation              AM-PAC PT "6 Clicks" Mobility   Outcome Measure  Help needed turning from your back to your side while in a flat bed without using bedrails?: None Help needed moving from lying on your back to sitting on the side of a flat bed without using bedrails?: None Help needed moving to and from a bed to a chair (including a wheelchair)?: A Little Help needed standing up from a chair using your arms (e.g., wheelchair or bedside chair)?: A Little Help needed to walk in hospital room?: A Little Help needed climbing 3-5 steps with a railing? : A Little 6 Click Score: 20    End of Session Equipment Utilized During Treatment: Gait belt Activity Tolerance: Patient tolerated treatment well Patient left: in bed;with call bell/phone within reach (seated EOB to eat lunch) Nurse Communication: Mobility status PT Visit Diagnosis: Unsteadiness on feet (R26.81);Other abnormalities of gait and mobility (R26.89);Other symptoms and signs involving the  nervous system (R29.898);Ataxic gait (R26.0)     Time: 1610-9604 PT Time Calculation (min) (ACUTE ONLY): 17 min  Charges:    $Gait Training: 8-22 mins PT General Charges $$ ACUTE PT VISIT: 1 Visit                     Johnny Bridge, PT Acute Rehab    Jacqualyn Posey 04/14/2023, 1:12 PM

## 2023-04-14 NOTE — Discharge Summary (Addendum)
Physician Discharge Summary   Patient: Tyler [name] MRN: [id number] DOB: 08-Mar-1950  Admit date:     04/07/2023  Discharge date: 04/14/23  Discharge Physician: Meredeth Ide   PCP: Nechama Guard, FNP   Recommendations at discharge:   Follow-up oncology as outpatient  Discharge Diagnoses: Principal Problem:   Weakness of left upper extremity Active Problems:   Brain metastatisis with midline shift .   Bilateral pulmonary embolism (HCC)   Hypertension   Paroxysmal atrial fibrillation (HCC)   CNS lymphoma   Counseling regarding advance care planning and goals of care   Seizures (HCC)  Resolved Problems:   * No resolved hospital problems. 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  Neurosurgery was consulted and notes significant recurrence of high-grade lymphoma with recommendation to start chemo and/or rads expeditiously.  Oncology is consulted.  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  Patient preferred to proceed with more aggressive approach with standard treatment involving chemotherapy Presented with worsening left-sided weakness  CT on 2/10 with interval increase in size of tumor at the craniotomy site MRI on 2/10 with increase in size of tumor with slightly increased surrounding edema Neurosurgery was consulted and  recommends expedited chemo/rads therapy He was started on Decadron 6 mg q6h Oncology was consulted, patient was transferred to [facility] hospital for initiation of high-dose methotrexate -Sodium bicarb IV and p.o. started, need to achieve urine pH more than 7  -Received leucovorin injection every 6 hours in the hospital -Methotrexate level was less than 0.05, -Leucovorin was discontinued -Patient be discharged home as per oncology recommendation.  Hypoxia -Patient had mild hypoxia yesterday when he was getting IV fluids at 150 mL/h. -Bicarb infusion was held overnight, this morning denies shortness of breath. -Chest x-ray portable ordered, no acute abnormality noted  Hypokalemia Replete   Hyperglycemia -Likely in setting of steroids -Continue very sensitive sliding scale with insulin -CBG well-controlled   ?  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 twice daily     H/o dysphagia This was an issue during his last hospitalization SLP consulted for diet recommendations (previously on dysphagia 3 diet)   Altered mental status Likely due to brain mets Continue supportive care and treatment plan as recommended by oncology.   A-fib No AC due to high bleeding risk Rate controlled with Toprol XL   History of bilateral pulmonary embolism Not on AC at this time He was hospitalized for a prolonged period of time last June with severe pancytopenia and tumor lysis syndrome; he was transfused about 15 units PRBC + platelets   Hypertension Continue Toprol XL   Prostate cancer Diagnosed 2021   Hep B antibody positive, chronic On tenofovir for hep B reactivation suppression in context of Rituxan use Plan is to continue for 6 months after his last chemotherapy. Follow ID closely  Tenofovir may need to be held while on methotrexate - will defer to oncology    Neuropathic pain -Complain of left upper  extremity neuropathic pain; little improvement with gabapentin -Was started on gabapentin for neuropathic pain -Gabapentin discontinued as patient started on Keppra for seizures as above         Consultants:  Procedures performed:  Disposition: Home Diet recommendation:  Discharge Diet Orders (From admission, onward)     Start     Ordered   04/14/23 0000  Diet - low sodium heart healthy        04/14/23 1122           Regular diet DISCHARGE MEDICATION: Allergies as of 04/14/2023       Reactions   Amitriptyline Other (See Comments)   Nightmares, Night sweats, Migraines   Amlodipine Swelling   BILATERAL FEET TO ANKLES   Neurontin [gabapentin] Other (See Comments)   Nightmares; Night sweats; Headache        Medication List     STOP taking these medications    allopurinol 100 MG tablet Commonly known as: ZYLOPRIM   clotrimazole-betamethasone cream Commonly known as: LOTRISONE   diclofenac Sodium 1 % Gel Commonly known as: VOLTAREN   Klor-Con M20 20 MEQ tablet Generic drug: potassium chloride SA   metoCLOPramide 10 MG tablet Commonly known as: REGLAN   multivitamin with minerals Tabs tablet       TAKE these medications    cholecalciferol 25 MCG (1000 UNIT) tablet Commonly known as: VITAMIN D3 Take 2 tablets (2,000 Units total) by mouth daily. What changed: how much to take   dexamethasone 4 MG tablet Commonly known as: DECADRON Take 1 tablet (4 mg total) by mouth 2 (two) times daily with breakfast and lunch.   docusate sodium 100 MG capsule Commonly known as: COLACE Take 1 capsule (100 mg total) by mouth 2 (two) times daily. What changed:  when to take this reasons to take this   folic acid 1 MG tablet Commonly known as: FOLVITE Take 1 mg by mouth daily.   furosemide 20 MG tablet Commonly known as: LASIX Take 1 tablet (20 mg total) by mouth daily. What changed:  when to take this reasons to take this   HYDROcodone-acetaminophen  5-325 MG tablet Commonly known as: NORCO/VICODIN Take 1 tablet by mouth every 6 (six) hours as needed for moderate pain (pain score 4-6).   levETIRAcetam 500 MG tablet Commonly known as: KEPPRA Take 1 tablet (500 mg total) by mouth 2 (two) times daily.   lidocaine-prilocaine cream Commonly known as: EMLA Apply to affected area once What changed:  how much to take how to take this when to take this reasons to take this additional instructions   metoprolol succinate 25 MG 24 hr tablet Commonly known as: Toprol XL Take 1 tablet (25 mg total) by mouth daily.   Muscle Rub 10-15 % Crea Apply 1 Application topically daily as needed for muscle pain.   ondansetron 8 MG tablet Commonly known as: Zofran Take 1 tablet (8 mg total) by mouth every 8 (eight) hours as needed for nausea or vomiting. Start on the third day after cyclophosphamide chemotherapy. What changed:  when to take this additional instructions   pantoprazole 40 MG tablet Commonly known as: Protonix Take 1 tablet (40 mg total) by mouth 2 (two) times daily. For 12 weeks What changed:  when to take this additional instructions   Vemlidy 25 MG tablet Generic drug: tenofovir alafenamide TAKE 1 TABLET  BY MOUTH 1 TIME A DAY   Vitamin B Complex Tabs Take 1 tablet by mouth daily.        Discharge Exam: Filed Weights   04/09/23 2030  Weight: 86.9 kg   General-appears in no acute distress Heart-S1-S2, regular, no murmur auscultated Lungs-clear to auscultation bilaterally, no wheezing or crackles auscultated Abdomen-soft, nontender, no organomegaly Extremities-no edema in the lower extremities Neuro-alert, oriented x3, no focal deficit noted  Condition at discharge: good  The results of significant diagnostics from this hospitalization (including imaging, microbiology, ancillary and laboratory) are listed below for reference.   Imaging Studies: DG CHEST PORT 1 VIEW Result Date: 04/10/2023 CLINICAL DATA:   Shortness of breath.  History of lymphoma EXAM: PORTABLE CHEST 1 VIEW COMPARISON:  X-ray 08/11/2022.  PET-CT 12/27/2022 FINDINGS: Right IJ chest port with tip overlying the right atrium. The port is accessed. Widening of the mediastinum is again seen. Enlarged cardiac silhouette. Basilar atelectasis or scar. No pneumothorax. Film is underinflated. No edema or effusion. Degenerative changes of the spine IMPRESSION: Underinflation.  Basilar atelectasis or scar. Widened mediastinum. Similar previous. Please correlate with prior PET-CT 12/27/2022 demonstrating prominent mediastinal fat. Chest port Electronically Signed   By: Karen Kays M.D.   On: 04/10/2023 19:17   EEG adult Result Date: 04/10/2023 Charlsie Quest, MD     04/11/2023  8:38 AM Patient Name: Tyler [name] MRN: [id number] Epilepsy Attending: Charlsie Quest Referring Physician/Provider: Johney Maine, MD Date: 04/10/2023 Duration: 22.17 mins Patient history: 73yo M patient with rt parietal CNS lymphoma with intermittent left sided painful burning paresthesias and weakness. EEG to evaluate for seizure Level of alertness: Awake, asleep AEDs during EEG study: None Technical aspects: This EEG study was done with scalp electrodes positioned according to the 10-20 International system of electrode placement. Electrical activity was reviewed with band pass filter of 1-70Hz , sensitivity of 7 uV/mm, display speed of 32mm/sec with a 60Hz  notched filter applied as appropriate. EEG data were recorded continuously and digitally stored.  Video monitoring was available and reviewed as appropriate. Description: The posterior dominant rhythm consists of 8-9 Hz activity of moderate voltage (25-35 uV) seen predominantly in posterior head regions, symmetric and reactive to eye opening and eye closing. Sleep was characterized by vertex waves, sleep spindles (12 to 14 Hz), maximal frontocentral region. EEG showed continuous sharply contoured 3 to 6 Hz theta-delta  slowing in right parietal region consistent with breach artifact. Sharp waves were noted in right parietal region. Hyperventilation and photic stimulation were not performed.   ABNORMALITY - Sharp wave, right parietal region - Continuous slow,  right parietal region IMPRESSION: This study showed evidence of epileptogenicity arising from right parietal region. Additionally there is cortical dysfunction arising from right parietal region consistent with underlying craniotomy. No seizures were seen throughout the recording. Charlsie Quest   MR Brain W and Wo Contrast Result Date: 04/07/2023 CLINICAL DATA:  Provided history: Neuro deficit, persistent/recurrent, CNS neoplasm suspected. Diffuse large B-cell lymphoma. Left-sided weakness with midline shift on CT. EXAM: MRI HEAD WITHOUT AND WITH CONTRAST TECHNIQUE: Multiplanar, multiecho pulse sequences of the brain and surrounding structures were obtained without and with intravenous contrast. CONTRAST:  8mL GADAVIST GADOBUTROL 1 MMOL/ML IV SOLN COMPARISON:  Head CT 04/07/2023.  Brain MRI 03/07/2023 neck FINDINGS: Intermittently motion degraded examination. Most notably, the axial T1 post-contrast sequence and coronal T2 sequence are moderately motion degraded. Within this limitation findings are as follows. Brain: Since the prior MRI 06/05/2023, there has been right parietal  craniotomy for biopsy/debulking of an enhancing right parietal lobe tumor. Non-acute blood products at the operative site. The residual enhancing tumor measures 5.4 x 3.6 x 4.4 cm (previously 4.4 x 3.0 x 3.3 when remeasured on prior). Prominent surrounding edema slightly increased. 6 mm leftward midline shift has also increased (previously 4 mm). Persistent effacement of portions of the right lateral ventricle. Persistent asymmetric prominence of the right lateral ventricle temporal horn consistent with ventricular entrapment. Trace postoperative subdural collection along the anterior right  cerebral hemisphere. Mild-to-moderate multifocal T2 FLAIR hyperintense signal abnormality elsewhere within the cerebral white matter, nonspecific but compatible with chronic small vessel ischemic disease. There is no acute infarct. Vascular: Maintained flow voids within the proximal large arterial vessels. Skull and upper cervical spine: Right parietal cranioplasty. No focal worrisome marrow lesion. Sinuses/orbits: No orbital mass or acute orbital finding.Mild mucosal thickening within the left maxillary sinus. IMPRESSION: 1. Motion degraded examination. 2. Since the prior brain MRI 03/07/2023, there has been right parietal craniotomy for biopsy/debulking of an enhancing right parietal lobe tumor. The residual tumor has increased in size, now measuring 5.4 x 3.6 x 4.4 cm (previously 4.4 x 3.0 x 3.3 cm). Prominent surrounding edema has slightly increased. 6 mm leftward midline shift has also increased. Unchanged asymmetric prominence of the right lateral ventricle temporal horn consistent with ventricular entrapment. Electronically Signed   By: Jackey Loge D.O.   On: 04/07/2023 14:01   CT Head Wo Contrast Result Date: 04/07/2023 CLINICAL DATA:  Facial paralysis/weakness (CN 7) EXAM: CT HEAD WITHOUT CONTRAST TECHNIQUE: Contiguous axial images were obtained from the base of the skull through the vertex without intravenous contrast. RADIATION DOSE REDUCTION: This exam was performed according to the departmental dose-optimization program which includes automated exposure control, adjustment of the mA and/or kV according to patient size and/or use of iterative reconstruction technique. COMPARISON:  Head CT 03/06/2023, brain MR 03/07/2023 FINDINGS: Brain: No hemorrhage. No hydrocephalus. No extra-axial fluid collection postsurgical changes from a right parietal craniotomy with interval increase in size of the residual/recurrent tumor at the craniotomy site, now measuring up to 4.7 x 2.7 cm, previously 3.2 x 2.5 cm,  when measured in a similar orientation. There is increased leftward midline shift, now measuring up to 6 mm, previously 3 mm. Unchanged disproportionate enlargement of the right temporal horn, worrisome for ventricular entrapment. Vascular: No hyperdense vessel or unexpected calcification. Skull: Normal. Negative for fracture or focal lesion. Status post parietal craniotomy Sinuses/Orbits: No middle ear or mastoid effusion. Mucosal thickening in the bilateral maxillary sinuses, right-greater-than-left orbits are unremarkable. Other: None. IMPRESSION: 1. Postsurgical changes from a right parietal craniotomy with interval increase in size of the residual/recurrent tumor at the craniotomy site, now measuring up to 4.7 x 2.7 cm, previously 3.2 x 2.5 cm. There is increased leftward midline shift, now measuring up to 6 mm, previously 3 mm. Recommend further evaluation with brain MRI. 2. Unchanged asymmetric enlargement of the right temporal horn, compatible with ventricular entrapment Electronically Signed   By: Lorenza Cambridge M.D.   On: 04/07/2023 10:57    Microbiology: Results for orders placed or performed during the hospital encounter of 04/07/23  Resp panel by RT-PCR (RSV, Flu A&B, Covid) Anterior Nasal Swab     Status: None   Collection Time: 04/07/23 10:57 AM   Specimen: Anterior Nasal Swab  Result Value Ref Range Status   SARS Coronavirus 2 by RT PCR NEGATIVE NEGATIVE Final   Influenza A by PCR NEGATIVE NEGATIVE Final   Influenza  B by PCR NEGATIVE NEGATIVE Final    Comment: (NOTE) The Xpert Xpress SARS-CoV-2/FLU/RSV plus assay is intended as an aid in the diagnosis of influenza from Nasopharyngeal swab specimens and should not be used as a sole basis for treatment. Nasal washings and aspirates are unacceptable for Xpert Xpress SARS-CoV-2/FLU/RSV testing.  Fact Sheet for Patients: BloggerCourse.com  Fact Sheet for Healthcare  Providers: SeriousBroker.it  This test is not yet approved or cleared by the [place] FDA and has been authorized for detection and/or diagnosis of SARS-CoV-2 by FDA under an Emergency Use Authorization (EUA). This EUA will remain in effect (meaning this test can be used) for the duration of the COVID-19 declaration under Section 564(b)(1) of the Act, 21 U.S.C. section 360bbb-3(b)(1), unless the authorization is terminated or revoked.     Resp Syncytial Virus by PCR NEGATIVE NEGATIVE Final    Comment: (NOTE) Fact Sheet for Patients: BloggerCourse.com  Fact Sheet for Healthcare Providers: SeriousBroker.it  This test is not yet approved or cleared by the [place] FDA and has been authorized for detection and/or diagnosis of SARS-CoV-2 by FDA under an Emergency Use Authorization (EUA). This EUA will remain in effect (meaning this test can be used) for the duration of the COVID-19 declaration under Section 564(b)(1) of the Act, 21 U.S.C. section 360bbb-3(b)(1), unless the authorization is terminated or revoked.  Performed at [facility], [address] [place], [place] [address]     Labs: CBC: Recent Labs  Lab 04/10/23 1625 04/11/23 0536 04/12/23 0523 04/13/23 1047 04/14/23 0555  WBC 9.8 7.0 5.7 4.9 4.5  NEUTROABS 8.2* 5.6 5.0 4.4 3.8  HGB 14.3 14.1 13.0 14.0 13.2  HCT 43.9 42.3 39.9 43.2 40.8  MCV 92.0 91.2 92.6 91.3 92.3  PLT 195 170 174 158 137*   Basic Metabolic Panel: Recent Labs  Lab 04/08/23 0520 04/10/23 1625 04/11/23 0536 04/12/23 0523 04/13/23 1047 04/14/23 0555  NA 141 140 140 143 139 137  K 4.0 3.3* 3.0* 3.4* 3.1* 4.3  CL 101 97* 92* 98 96* 99  CO2 23 32 35* 37* 31 29  GLUCOSE 129* 152* 138* 137* 180* 134*  BUN 11 28* 27* 35* 32* 28*  CREATININE 0.88 0.83 0.98 1.39* 1.22 0.91  CALCIUM 9.3 8.2* 8.0* 7.9* 8.1* 7.9*  MG 1.7 2.1  --   --   --   --     Liver Function Tests: Recent Labs  Lab 04/10/23 1625 04/11/23 0536 04/12/23 0523 04/13/23 1047 04/14/23 0555  AST 83* 55* 41 48* 29  ALT 123* 105* 88* 81* 64*  ALKPHOS 108 92 77 80 68  BILITOT 0.9 0.7 0.6 1.0 0.8  PROT 6.1* 5.7* 5.2* 5.5* 4.9*  ALBUMIN 3.6 3.4* 2.9* 3.2* 2.7*   CBG: Recent Labs  Lab 04/13/23 1153 04/13/23 1559 04/13/23 2146 04/14/23 0743 04/14/23 1117  GLUCAP 161* 124* 144* 120* 194*    Discharge time spent: greater than 30 minutes.  Signed: Meredeth Ide, MD [facility] 04/14/2023

## 2023-04-14 NOTE — Progress Notes (Cosign Needed Addendum)
Tyler [name]   DOB:1951-01-27   ZO#:[id number]      ASSESSMENT & PLAN:  1.  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  Patient preferred to proceed with more aggressive approach with standard treatment involving chemotherapy. - S/p craniotomy with gross total resection on 03/10/2023. - CT of head 04/07/2023 showed interval increase in size of tumor at the craniotomy site.  MRI of head 04/07/2023 increase in size of tumor with slightly increased surrounding edema. - Status post high-dose methotrexate on 04/09/2023.   - Sodium bicarb IV and p.o. started.  It was decreased to 100 ml/hr as patient had slight shortness of breath noted 04/09/2023.  Lasix x 1 was given, no further shortness of breath noted. - Continue to monitor urine pH daily.  Urine pH 9.0 today. -Continue to monitor methotrexate daily until <0.1.  Levels for today remain pending.  Level on 04/13/2023 <0.05.  Plan discharge home on dexamethasone 4 mg twice daily with breakfast and lunch x 1 week, then 4 mg p.o. daily with breakfast until C2 of high-dose methotrexate. - Medical oncology/Dr. Candise Che following closely.  Patient has outpatient oncology visit scheduled for 04/17/2023 at 12 PM.   2.  Possible seizures - Patient complains of left-sided tingling and burning sensation consistent with partial sensory seizures - Seen by neuro and EEG study done 04/10/2023.  Shows evidence of epileptogenicity arising from right parietal region.  No seizures were seen during the recording. -On Keppra 500 mg p.o. twice daily, indeed patient has had significant improvement in symptoms.  He reports he only had 1 overnight and is quite pleased with reduction of symptoms. - PT/OT to  determine discharge needs.   3.  Altered mental status -Improved. - Likely due to CNS lymphoma with edema - Continue supportive care and treatment plan as recommended by oncology.   4.  Elevated blood glucose  - Monitor blood sugar levels closely. -Primary team following    5.  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  A-fib - Status post Eliquis.  Discontinued due to high risk for bleed. - Follow closely with cardiology   7.  Prostate cancer - Diagnosed 2021   8.  Hep B antibody positive, chronic - On tenofovir for hep B reactivation suppression in context of Rituxan use.  Plan is to continue for 6 months after his last chemotherapy. - Follow ID closely    Code Status Full    Subjective:  Patient seen awake alert and oriented x 3 laying supine in bed.  Patient reports he is much better and left sided tingling has reduced significantly, reports only 1 overnight.  No other acute distress is noted.  Patient aware of possible discharge today and agreeable to outpatient oncology follow-up.  Objective:  Vitals:   04/13/23 2148 04/14/23 0910  BP: (!) 158/104 (!) 174/94  Pulse: (!) 56 77  Resp: 20   Temp: 98.1 F (36.7 C)   SpO2: 96%      Intake/Output Summary (Last 24 hours) at 04/14/2023 1030 Last data filed at 04/14/2023 9811 Gross per 24 hour  Intake 260 ml  Output 1775 ml  Net -1515 ml     REVIEW OF SYSTEMS:  Constitutional: Denies fevers, chills or abnormal night sweats Eyes: Denies blurriness of vision, double vision or watery eyes Ears, nose, mouth, throat, and face: Denies mucositis or sore throat Respiratory: Denies cough, dyspnea or wheezes Cardiovascular: Denies palpitation, chest discomfort or lower extremity swelling Gastrointestinal:  Denies nausea, heartburn or change in bowel habits Skin: Denies abnormal skin rashes Lymphatics: Denies new lymphadenopathy or easy bruising Neurological: Denies  numbness, tingling or new weaknesses Behavioral/Psych: Mood is stable, no new changes  All other systems were reviewed with the patient and are negative.  PHYSICAL EXAMINATION: ECOG PERFORMANCE STATUS: 2 - Symptomatic, <50% confined to bed  Vitals:   04/13/23 2148 04/14/23 0910  BP: (!) 158/104 (!) 174/94  Pulse: (!) 56 77  Resp: 20   Temp: 98.1 F (36.7 C)   SpO2: 96%    Filed Weights   04/09/23 2030  Weight: 191 lb 8 oz (86.9 kg)    GENERAL: alert, no distress and comfortable SKIN: skin color, texture, turgor are normal, no rashes or significant lesions EYES: normal, conjunctiva are pink and non-injected, sclera clear OROPHARYNX: no exudate, no erythema and lips, buccal mucosa, and tongue normal  NECK: supple, thyroid normal size, non-tender, without nodularity LYMPH: no palpable lymphadenopathy in the cervical, axillary or inguinal LUNGS: clear to auscultation and percussion with normal breathing effort HEART: regular rate & rhythm and no murmurs and no lower extremity edema ABDOMEN: abdomen soft, non-tender and normal bowel sounds MUSCULOSKELETAL: no cyanosis of digits and no clubbing  PSYCH: alert & oriented x 3 with fluent speech NEURO: no focal motor/sensory deficits   All questions were answered. The patient knows to call the clinic with any problems, questions or concerns.   The total time spent in the appointment was 40 minutes encounter with patient including review of chart and various tests results, discussions about plan of care and coordination of care plan  Dawson Bills, NP 04/14/2023 10:30 AM    Labs Reviewed:  Lab Results  Component Value Date   WBC 4.5 04/14/2023   HGB 13.2 04/14/2023   HCT 40.8 04/14/2023   MCV 92.3 04/14/2023   PLT 137 (L) 04/14/2023   Recent Labs    08/11/22 0316 08/11/22 1544 08/12/22 0515 08/12/22 1400 08/20/22 0302 08/21/22 0334 04/12/23 0523 04/13/23 1047 04/14/23 0555  NA 134*   < > 137   < > 137   < > 143 139  137  K 3.7   < > 3.3*   < > 4.0   < > 3.4* 3.1* 4.3  CL 99   < > 101   < > 102   < > 98 96* 99  CO2 25   < > 28   < > 24   < > 37* 31 29  GLUCOSE 185*   < > 114*   < > 78   < > 137* 180* 134*  BUN 52*   < > 41*   < > 45*   < > 35* 32* 28*  CREATININE 1.58*   < > 1.44*   < > 3.38*   < > 1.39* 1.22 0.91  CALCIUM 7.4*   < > 7.5*   < > 7.2*   < > 7.9* 8.1* 7.9*  GFRNONAA 47*   < > 52*   < > 19*   < > 55* >60 >60  PROT 4.6*  --  4.2*  --  3.9*   < > 5.2* 5.5* 4.9*  ALBUMIN 1.8*  1.7*   < >  1.7*  1.6*   < > 1.7*   < > 2.9* 3.2* 2.7*  AST 33  --  31  --  25   < > 41 48* 29  ALT 31  --  31  --  18   < > 88* 81* 64*  ALKPHOS 84  --  79  --  111   < > 77 80 68  BILITOT 3.2*  --  2.5*  --  0.8   < > 0.6 1.0 0.8  BILIDIR 1.7*  --  1.3*  --  0.2  --   --   --   --   IBILI 1.5*  --  1.2*  --  0.6  --   --   --   --    < > = values in this interval not displayed.    Studies Reviewed:  DG CHEST PORT 1 VIEW Result Date: 04/10/2023 CLINICAL DATA:  Shortness of breath.  History of lymphoma EXAM: PORTABLE CHEST 1 VIEW COMPARISON:  X-ray 08/11/2022.  PET-CT 12/27/2022 FINDINGS: Right IJ chest port with tip overlying the right atrium. The port is accessed. Widening of the mediastinum is again seen. Enlarged cardiac silhouette. Basilar atelectasis or scar. No pneumothorax. Film is underinflated. No edema or effusion. Degenerative changes of the spine IMPRESSION: Underinflation.  Basilar atelectasis or scar. Widened mediastinum. Similar previous. Please correlate with prior PET-CT 12/27/2022 demonstrating prominent mediastinal fat. Chest port Electronically Signed   By: Karen Kays M.D.   On: 04/10/2023 19:17   EEG adult Result Date: 04/10/2023 Charlsie Quest, MD     04/11/2023  8:38 AM Patient Name: Saatvik Thielman MRN: [id number] Epilepsy Attending: Charlsie Quest Referring Physician/Provider: Johney Maine, MD Date: 04/10/2023 Duration: 22.17 mins Patient history: 73yo M patient with rt parietal CNS  lymphoma with intermittent left sided painful burning paresthesias and weakness. EEG to evaluate for seizure Level of alertness: Awake, asleep AEDs during EEG study: None Technical aspects: This EEG study was done with scalp electrodes positioned according to the 10-20 International system of electrode placement. Electrical activity was reviewed with band pass filter of 1-70Hz , sensitivity of 7 uV/mm, display speed of 51mm/sec with a 60Hz  notched filter applied as appropriate. EEG data were recorded continuously and digitally stored.  Video monitoring was available and reviewed as appropriate. Description: The posterior dominant rhythm consists of 8-9 Hz activity of moderate voltage (25-35 uV) seen predominantly in posterior head regions, symmetric and reactive to eye opening and eye closing. Sleep was characterized by vertex waves, sleep spindles (12 to 14 Hz), maximal frontocentral region. EEG showed continuous sharply contoured 3 to 6 Hz theta-delta slowing in right parietal region consistent with breach artifact. Sharp waves were noted in right parietal region. Hyperventilation and photic stimulation were not performed.   ABNORMALITY - Sharp wave, right parietal region - Continuous slow,  right parietal region IMPRESSION: This study showed evidence of epileptogenicity arising from right parietal region. Additionally there is cortical dysfunction arising from right parietal region consistent with underlying craniotomy. No seizures were seen throughout the recording. Charlsie Quest   MR Brain W and Wo Contrast Result Date: 04/07/2023 CLINICAL DATA:  Provided history: Neuro deficit, persistent/recurrent, CNS neoplasm suspected. Diffuse large B-cell lymphoma. Left-sided weakness with midline shift on CT. EXAM: MRI HEAD WITHOUT AND WITH CONTRAST TECHNIQUE: Multiplanar, multiecho pulse sequences of the brain and surrounding structures were obtained without and with intravenous contrast. CONTRAST:  8mL GADAVIST  GADOBUTROL 1 MMOL/ML IV SOLN COMPARISON:  Head CT 04/07/2023.  Brain MRI 03/07/2023 neck FINDINGS: Intermittently motion degraded examination. Most notably, the axial T1 post-contrast sequence and coronal T2 sequence are moderately motion degraded. Within this limitation findings are as follows. Brain: Since the prior MRI 06/05/2023, there has been right parietal craniotomy for biopsy/debulking of an enhancing right parietal lobe tumor. Non-acute blood products at the operative site. The residual enhancing tumor measures 5.4 x 3.6 x 4.4 cm (previously 4.4 x 3.0 x 3.3 when remeasured on prior). Prominent surrounding edema slightly increased. 6 mm leftward midline shift has also increased (previously 4 mm). Persistent effacement of portions of the right lateral ventricle. Persistent asymmetric prominence of the right lateral ventricle temporal horn consistent with ventricular entrapment. Trace postoperative subdural collection along the anterior right cerebral hemisphere. Mild-to-moderate multifocal T2 FLAIR hyperintense signal abnormality elsewhere within the cerebral white matter, nonspecific but compatible with chronic small vessel ischemic disease. There is no acute infarct. Vascular: Maintained flow voids within the proximal large arterial vessels. Skull and upper cervical spine: Right parietal cranioplasty. No focal worrisome marrow lesion. Sinuses/orbits: No orbital mass or acute orbital finding.Mild mucosal thickening within the left maxillary sinus. IMPRESSION: 1. Motion degraded examination. 2. Since the prior brain MRI 03/07/2023, there has been right parietal craniotomy for biopsy/debulking of an enhancing right parietal lobe tumor. The residual tumor has increased in size, now measuring 5.4 x 3.6 x 4.4 cm (previously 4.4 x 3.0 x 3.3 cm). Prominent surrounding edema has slightly increased. 6 mm leftward midline shift has also increased. Unchanged asymmetric prominence of the right lateral ventricle  temporal horn consistent with ventricular entrapment. Electronically Signed   By: Jackey Loge D.O.   On: 04/07/2023 14:01   CT Head Wo Contrast Result Date: 04/07/2023 CLINICAL DATA:  Facial paralysis/weakness (CN 7) EXAM: CT HEAD WITHOUT CONTRAST TECHNIQUE: Contiguous axial images were obtained from the base of the skull through the vertex without intravenous contrast. RADIATION DOSE REDUCTION: This exam was performed according to the departmental dose-optimization program which includes automated exposure control, adjustment of the mA and/or kV according to patient size and/or use of iterative reconstruction technique. COMPARISON:  Head CT 03/06/2023, brain MR 03/07/2023 FINDINGS: Brain: No hemorrhage. No hydrocephalus. No extra-axial fluid collection postsurgical changes from a right parietal craniotomy with interval increase in size of the residual/recurrent tumor at the craniotomy site, now measuring up to 4.7 x 2.7 cm, previously 3.2 x 2.5 cm, when measured in a similar orientation. There is increased leftward midline shift, now measuring up to 6 mm, previously 3 mm. Unchanged disproportionate enlargement of the right temporal horn, worrisome for ventricular entrapment. Vascular: No hyperdense vessel or unexpected calcification. Skull: Normal. Negative for fracture or focal lesion. Status post parietal craniotomy Sinuses/Orbits: No middle ear or mastoid effusion. Mucosal thickening in the bilateral maxillary sinuses, right-greater-than-left orbits are unremarkable. Other: None. IMPRESSION: 1. Postsurgical changes from a right parietal craniotomy with interval increase in size of the residual/recurrent tumor at the craniotomy site, now measuring up to 4.7 x 2.7 cm, previously 3.2 x 2.5 cm. There is increased leftward midline shift, now measuring up to 6 mm, previously 3 mm. Recommend further evaluation with brain MRI. 2. Unchanged asymmetric enlargement of the right temporal horn, compatible with  ventricular entrapment Electronically Signed   By: Lorenza Cambridge M.D.   On: 04/07/2023 10:57     ADDENDUM  .Patient was Personally and independently interviewed, examined and relevant elements of the history of present illness were reviewed in details and an assessment and  plan was created. All elements of the patient's history of present illness , assessment and plan were discussed in details with Hinton Dyer NP. The above documentation reflects our combined findings assessment and plan.   Wyvonnia Lora MD MS

## 2023-04-15 LAB — METHOTREXATE: Methotrexate: <0.05

## 2023-04-16 ENCOUNTER — Encounter (HOSPITAL_COMMUNITY)
Admission: RE | Admit: 2023-04-16 | Discharge: 2023-04-16 | Disposition: A | Discharge status: Home / Self Care | Payer: 59 | Source: Ambulatory Visit | Attending: Hematology | Admitting: Hematology

## 2023-04-16 ENCOUNTER — Other Ambulatory Visit: Payer: Self-pay

## 2023-04-16 DIAGNOSIS — C8338 Diffuse large B-cell lymphoma, lymph nodes of multiple sites: Secondary | ICD-10-CM

## 2023-04-16 DIAGNOSIS — C8334 Diffuse large B-cell lymphoma, lymph nodes of axilla and upper limb: Secondary | ICD-10-CM | POA: Diagnosis not present

## 2023-04-16 DIAGNOSIS — R932 Abnormal findings on diagnostic imaging of liver and biliary tract: Secondary | ICD-10-CM | POA: Diagnosis not present

## 2023-04-16 LAB — GLUCOSE, CAPILLARY: Glucose-Capillary: 98 mg/dL (ref 70–99)

## 2023-04-16 MED ORDER — PANTOPRAZOLE SODIUM 40 MG PO TBEC: 40.0000 mg | DELAYED_RELEASE_TABLET | Freq: Two times a day (BID) | ORAL | 2 refills | Status: DC

## 2023-04-16 MED ORDER — FLUDEOXYGLUCOSE F - 18 (FDG) INJECTION
9.0600 | Freq: Once | INTRAVENOUS | Status: AC
  Administered 2023-04-16: 9.06 via INTRAVENOUS

## 2023-04-17 ENCOUNTER — Other Ambulatory Visit: Payer: Self-pay

## 2023-04-17 DIAGNOSIS — C8338 Diffuse large B-cell lymphoma, lymph nodes of multiple sites: Secondary | ICD-10-CM

## 2023-04-18 ENCOUNTER — Inpatient Hospital Stay: Payer: 59 | Attending: Hematology

## 2023-04-18 ENCOUNTER — Inpatient Hospital Stay: Payer: 59

## 2023-04-18 ENCOUNTER — Other Ambulatory Visit: Payer: Self-pay

## 2023-04-18 ENCOUNTER — Inpatient Hospital Stay (HOSPITAL_BASED_OUTPATIENT_CLINIC_OR_DEPARTMENT_OTHER): Payer: 59 | Admitting: Hematology

## 2023-04-18 VITALS — BP 157/102 | HR 64 | Temp 98.4°F | Resp 17 | Ht 67.0 in | Wt 188.0 lb

## 2023-04-18 DIAGNOSIS — R300 Dysuria: Secondary | ICD-10-CM

## 2023-04-18 DIAGNOSIS — C8338 Diffuse large B-cell lymphoma, lymph nodes of multiple sites: Secondary | ICD-10-CM | POA: Diagnosis not present

## 2023-04-18 DIAGNOSIS — Z452 Encounter for adjustment and management of vascular access device: Secondary | ICD-10-CM | POA: Insufficient documentation

## 2023-04-18 DIAGNOSIS — C8339 Primary central nervous system lymphoma: Secondary | ICD-10-CM | POA: Diagnosis not present

## 2023-04-18 DIAGNOSIS — N390 Urinary tract infection, site not specified: Secondary | ICD-10-CM | POA: Diagnosis not present

## 2023-04-18 DIAGNOSIS — Z95828 Presence of other vascular implants and grafts: Secondary | ICD-10-CM

## 2023-04-18 LAB — URINALYSIS, COMPLETE (UACMP) WITH MICROSCOPIC
Bacteria, UA: NONE SEEN
Bilirubin Urine: NEGATIVE
Glucose, UA: NEGATIVE mg/dL
Hgb urine dipstick: NEGATIVE
Ketones, ur: NEGATIVE mg/dL
Leukocytes,Ua: NEGATIVE
Nitrite: NEGATIVE
Protein, ur: NEGATIVE mg/dL
Specific Gravity, Urine: 1.011 (ref 1.005–1.030)
pH: 5 (ref 5.0–8.0)

## 2023-04-18 LAB — CBC WITH DIFFERENTIAL (CANCER CENTER ONLY)
Abs Immature Granulocytes: 0.07 10*3/uL (ref 0.00–0.07)
Basophils Absolute: 0 10*3/uL (ref 0.0–0.1)
Basophils Relative: 0 %
Eosinophils Absolute: 0 10*3/uL (ref 0.0–0.5)
Eosinophils Relative: 0 %
HCT: 41.5 % (ref 39.0–52.0)
Hemoglobin: 14.4 g/dL (ref 13.0–17.0)
Immature Granulocytes: 1 %
Lymphocytes Relative: 7 %
Lymphs Abs: 0.5 10*3/uL — ABNORMAL LOW (ref 0.7–4.0)
MCH: 30.4 pg (ref 26.0–34.0)
MCHC: 34.7 g/dL (ref 30.0–36.0)
MCV: 87.6 fL (ref 80.0–100.0)
Monocytes Absolute: 0.5 10*3/uL (ref 0.1–1.0)
Monocytes Relative: 7 %
Neutro Abs: 5.8 10*3/uL (ref 1.7–7.7)
Neutrophils Relative %: 85 %
Platelet Count: 119 10*3/uL — ABNORMAL LOW (ref 150–400)
RBC: 4.74 MIL/uL (ref 4.22–5.81)
RDW: 12.4 % (ref 11.5–15.5)
WBC Count: 6.9 10*3/uL (ref 4.0–10.5)
nRBC: 0 % (ref 0.0–0.2)

## 2023-04-18 LAB — CMP (CANCER CENTER ONLY)
ALT: 53 U/L — ABNORMAL HIGH (ref 0–44)
AST: 19 U/L (ref 15–41)
Albumin: 3.9 g/dL (ref 3.5–5.0)
Alkaline Phosphatase: 93 U/L (ref 38–126)
Anion gap: 7 (ref 5–15)
BUN: 23 mg/dL (ref 8–23)
CO2: 26 mmol/L (ref 22–32)
Calcium: 8.6 mg/dL — ABNORMAL LOW (ref 8.9–10.3)
Chloride: 101 mmol/L (ref 98–111)
Creatinine: 0.93 mg/dL (ref 0.61–1.24)
GFR, Estimated: >60 mL/min (ref >60)
Glucose, Bld: 121 mg/dL — ABNORMAL HIGH (ref 70–99)
Potassium: 3.7 mmol/L (ref 3.5–5.1)
Sodium: 134 mmol/L — ABNORMAL LOW (ref 135–145)
Total Bilirubin: 0.6 mg/dL (ref 0.0–1.2)
Total Protein: 5.9 g/dL — ABNORMAL LOW (ref 6.5–8.1)

## 2023-04-18 LAB — SAMPLE TO BLOOD BANK

## 2023-04-18 MED ORDER — HEPARIN SOD (PORK) LOCK FLUSH 100 UNIT/ML IV SOLN
500.0000 [IU] | Freq: Once | INTRAVENOUS | Status: AC | PRN
  Administered 2023-04-18: 500 [IU]

## 2023-04-18 MED ORDER — SODIUM CHLORIDE 0.9% FLUSH
10.0000 mL | INTRAVENOUS | Status: DC | PRN
  Administered 2023-04-18: 10 mL

## 2023-04-18 NOTE — Progress Notes (Signed)
 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 male with a past medical history of prostate cancer status post prostatectomy in 2021, history of bilateral pulmonary embolism after his surgery in 2021 presenting today for follow up and management of MDS.  He originally presented to the ED on 11/28/21 for low back pain with radiation down his leg. He was diagnosed with sciatica at urgent care prior to the ED visit. He had a negative xray and CT. He returned to the ED after developing a fever and hematuria. His urologist had stopped his Eliquis 3 months earlier. While in the ED on 12/10/21 he was noted to be febrile with blood work showing HGB of 6.2 and a UTI. His stool was heme-negative. He was hospitalized for treatment of his UTI with secondary sepsis.  Today, he states that he has been doing okay since our last visit. He feels that he is getting stronger.   However, he continues to have low back pain which is worsened with bending over and lying down on his back. His pain radiates into his lateral right leg. He has been walking for half an hour or so but notes that his pain worsens with walking in the cold. He has  not been working since I last saw him. His back pain has improved somewhat since he stopped working. He is scheduled to see his PCP next week and plans to discuss referral for orthospine at that time.  He notes some dizziness when he takes some of his medications.  Since his last visit he has completely stopped drinking alcohol and use of Ibuprofen. His appetite has improved. His HGB has continued to improve to 10.1. His platelets have also improved to 74,000. His WBC counts continue to remain WNL. CMP also improved.  He reports having some enlarged lymph nodes in the left anterior cervical region x1 month. He denies any dental problems. He denies any other lumps or bumps.  He reports a recent episode of chills. He denies any recent vaccines. He denies signs of infection such as sore throat, sinus drainage, cough, or urinary symptoms. He denies fevers. He denies nausea, vomiting, chest pain, dyspnea or cough. He denies any bowel changes. His weight has decreased 6 pounds over last 1.5 months .   INTERVAL HISTORY:  Tyler [name] is a 73 y.o. 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 I did not feel any enlarged lymph nodes at that time. He continued to have one surgical staple from his surgery.   He was last  seen by me as an inpatient on 04/14/2023 and reported one episode of left-sided tingling.   Today, he is accompanied by his son and an interpreter is also present. He reports that he is functioning fairly well at home  Patient reports improved strength in his left hand and denies any more episodes of saliva issues. He reports that he sometimes has episodes of mild burning in his left hand occurring in the mornings, lasting 1-2 minutes at a time.   He does take Keppra and Dexamethasone daily as prescribed. Patient reports some mild  sleep issues in which he awakens during the night.   He denies any headaches or change in vision.  Patient is eating well and generally stays well hydrated.   He does walk independently without any falls or balance issues. Patient denies any fever, other sign of infection, nausea, or abdominal pain.   He reports a new symptom of feeling his heart pumping in his upper left arm when sleeping. His blood pressure in clinic today is 157/102.   Patient's catheter has been removed recently. He complains of a few drops of blood with urination. He reports leaking and burning from his catheter.   REVIEW OF SYSTEMS:    10 Point review of Systems was done is negative except as noted above.   Past Medical History:  Diagnosis Date   Cancer [facility])    prostate   Follicular lymphoma (HCC) 2024   History of pulmonary embolism    Hypertension    Myelodysplastic syndrome (HCC)    PAF (paroxysmal atrial fibrillation) (HCC)    Recurrent UTI    Past Surgical History:  Procedure Laterality Date   APPLICATION OF CRANIAL NAVIGATION Right 03/10/2023   Procedure: APPLICATION OF CRANIAL NAVIGATION;  Surgeon: Tressie Stalker, MD;  Location: [facility] OR;  Service: Neurosurgery;  Laterality: Right;   BIOPSY  08/21/2022   Procedure: BIOPSY;  Surgeon: Beverley Fiedler, MD;  Location: [facility] ENDOSCOPY;  Service: Gastroenterology;;   CRANIOTOMY Right 03/10/2023   Procedure: PARIETAL CRANIOTOMY;  Surgeon: Tressie Stalker, MD;  Location: [facility] OR;  Service: Neurosurgery;  Laterality: Right;   ESOPHAGOGASTRODUODENOSCOPY (EGD) WITH PROPOFOL N/A 08/21/2022   Procedure: ESOPHAGOGASTRODUODENOSCOPY (EGD) WITH PROPOFOL;  Surgeon: Beverley Fiedler, MD;  Location: [facility]COPY;  Service: Gastroenterology;  Laterality: N/A;   IR IMAGING GUIDED PORT INSERTION  08/26/2022   LYMPHADENECTOMY Bilateral 09/01/2019   Procedure: LYMPHADENECTOMY;  Surgeon: Sebastian Ache, MD;  Location: [facility] ORS;  Service: Urology;  Laterality: Bilateral;   ROBOT ASSISTED  LAPAROSCOPIC RADICAL PROSTATECTOMY N/A 09/01/2019   Procedure: XI ROBOTIC ASSISTED LAPAROSCOPIC RADICAL PROSTATECTOMY;  Surgeon: Sebastian Ache, MD;  Location: [facility] ORS;  Service: Urology;  Laterality: N/A;  3 HRS   Social History   Tobacco Use   Smoking status: Never   Smokeless tobacco: Never  Vaping Use   Vaping status: Never Used  Substance Use Topics   Alcohol use: Yes    Comment: at least one 40 oz beer daily   Drug use: Never    ALLERGIES:  is allergic to amitriptyline, amlodipine, and neurontin [gabapentin].  MEDICATIONS:  Current Outpatient Medications  Medication Sig Dispense Refill   B Complex Vitamins (VITAMIN B COMPLEX) TABS Take 1 tablet by mouth daily. 30 tablet 0   cholecalciferol (VITAMIN D3) 25 MCG (1000 UNIT) tablet Take 2 tablets (2,000 Units total) by mouth daily. (Patient taking differently: Take 1,000 Units by mouth daily.)     dexamethasone (DECADRON) 4 MG tablet Take 1 tablet (4 mg total)  by mouth 2 (two) times daily with breakfast and lunch. 60 tablet 0   docusate sodium (COLACE) 100 MG capsule Take 1 capsule (100 mg total) by mouth 2 (two) times daily. (Patient taking differently: Take 100 mg by mouth daily as needed for mild constipation.) 10 capsule 0   folic acid (FOLVITE) 1 MG tablet Take 1 mg by mouth daily.     furosemide (LASIX) 20 MG tablet Take 1 tablet (20 mg total) by mouth daily. (Patient taking differently: Take 20 mg by mouth daily as needed for fluid or edema.) 90 tablet 1   HYDROcodone-acetaminophen (NORCO/VICODIN) 5-325 MG tablet Take 1 tablet by mouth every 6 (six) hours as needed for moderate pain (pain score 4-6). 20 tablet 0   levETIRAcetam (KEPPRA) 500 MG tablet Take 1 tablet (500 mg total) by mouth 2 (two) times daily. 60 tablet 1   lidocaine-prilocaine (EMLA) cream Apply to affected area once (Patient taking differently: Apply 1 Application topically as needed (port).) 30 g 3   Menthol-Methyl Salicylate (MUSCLE RUB) 10-15 % CREA Apply 1  Application topically daily as needed for muscle pain.     metoprolol succinate (TOPROL XL) 25 MG 24 hr tablet Take 1 tablet (25 mg total) by mouth daily. 90 tablet 3   ondansetron (ZOFRAN) 8 MG tablet Take 1 tablet (8 mg total) by mouth every 8 (eight) hours as needed for nausea or vomiting. Start on the third day after cyclophosphamide chemotherapy. (Patient taking differently: Take 8 mg by mouth daily as needed for nausea or vomiting.) 30 tablet 1   pantoprazole (PROTONIX) 40 MG tablet Take 1 tablet (40 mg total) by mouth 2 (two) times daily. For 12 weeks 60 tablet 2   VEMLIDY 25 MG tablet TAKE 1 TABLET BY MOUTH 1 TIME A DAY 30 tablet 1   No current facility-administered medications for this visit.    PHYSICAL EXAMINATION: ECOG PERFORMANCE STATUS: 1 - Symptomatic but completely ambulatory .BP (!) 157/102 (BP Location: Left Arm, Patient Position: Sitting) Comment: nurse is aware  Pulse 64   Temp 98.4 F (36.9 C) (Temporal)   Resp 17   Ht 5\' 7"  (1.702 m)   Wt 188 lb (85.3 kg)   SpO2 100%   BMI 29.44 kg/m    GENERAL:alert, in no acute distress and comfortable SKIN: no acute rashes, no significant lesions EYES: conjunctiva are pink and non-injected, sclera anicteric OROPHARYNX: MMM, no exudates, no oropharyngeal erythema or ulceration NECK: supple, no JVD LYMPH:  no palpable lymphadenopathy in the cervical, axillary or inguinal regions LUNGS: clear to auscultation b/l with normal respiratory effort HEART: regular rate & rhythm ABDOMEN:  normoactive bowel sounds , non tender, not distended. Extremity: no pedal edema PSYCH: alert & oriented x 3 with fluent speech NEURO: no focal motor/sensory deficits   LABORATORY DATA:   I have reviewed the data as listed .    Latest Ref Rng & Units 04/14/2023    5:55 AM 04/13/2023   10:47 AM 04/12/2023    5:23 AM  CBC  WBC 4.0 - 10.5 K/uL 4.5  4.9  5.7   Hemoglobin 13.0 - 17.0 g/dL 40.9  81.1  91.4   Hematocrit 39.0 - 52.0 % 40.8  43.2   39.9   Platelets 150 - 400 K/uL 137  158  174    .    Latest Ref Rng & Units 04/14/2023    5:55 AM 04/13/2023   10:47 AM 04/12/2023    5:23 AM  CMP  Glucose  70 - 99 mg/dL 161  096  045   BUN 8 - 23 mg/dL 28  32  35   Creatinine 0.61 - 1.24 mg/dL 4.09  8.11  9.14   Sodium 135 - 145 mmol/L 137  139  143   Potassium 3.5 - 5.1 mmol/L 4.3  3.1  3.4   Chloride 98 - 111 mmol/L 99  96  98   CO2 22 - 32 mmol/L 29  31  37   Calcium 8.9 - 10.3 mg/dL 7.9  8.1  7.9   Total Protein 6.5 - 8.1 g/dL 4.9  5.5  5.2   Total Bilirubin 0.0 - 1.2 mg/dL 0.8  1.0  0.6   Alkaline Phos 38 - 126 U/L 68  80  77   AST 15 - 41 U/L 29  48  41   ALT 0 - 44 U/L 64  81  88    . Lab Results  Component Value Date   LDH 192 09/18/2022    Brain tumor biopsy 03/10/2023:     Lymph node biopsy 07/16/2022    RADIOGRAPHIC STUDIES: I have personally reviewed the radiological images as listed and agreed with the findings in the report. DG CHEST PORT 1 VIEW Result Date: 04/10/2023 CLINICAL DATA:  Shortness of breath.  History of lymphoma EXAM: PORTABLE CHEST 1 VIEW COMPARISON:  X-ray 08/11/2022.  PET-CT 12/27/2022 FINDINGS: Right IJ chest port with tip overlying the right atrium. The port is accessed. Widening of the mediastinum is again seen. Enlarged cardiac silhouette. Basilar atelectasis or scar. No pneumothorax. Film is underinflated. No edema or effusion. Degenerative changes of the spine IMPRESSION: Underinflation.  Basilar atelectasis or scar. Widened mediastinum. Similar previous. Please correlate with prior PET-CT 12/27/2022 demonstrating prominent mediastinal fat. Chest port Electronically Signed   By: Karen Kays M.D.   On: 04/10/2023 19:17   EEG adult Result Date: 04/10/2023 Charlsie Quest, MD     04/11/2023  8:38 AM Patient Name: Tyler [name] MRN: [id number] Epilepsy Attending: Charlsie Quest Referring Physician/Provider: Johney Maine, MD Date: 04/10/2023 Duration: 22.17 mins Patient history: 73yo  M patient with rt parietal CNS lymphoma with intermittent left sided painful burning paresthesias and weakness. EEG to evaluate for seizure Level of alertness: Awake, asleep AEDs during EEG study: None Technical aspects: This EEG study was done with scalp electrodes positioned according to the 10-20 International system of electrode placement. Electrical activity was reviewed with band pass filter of 1-70Hz , sensitivity of 7 uV/mm, display speed of 77mm/sec with a 60Hz  notched filter applied as appropriate. EEG data were recorded continuously and digitally stored.  Video monitoring was available and reviewed as appropriate. Description: The posterior dominant rhythm consists of 8-9 Hz activity of moderate voltage (25-35 uV) seen predominantly in posterior head regions, symmetric and reactive to eye opening and eye closing. Sleep was characterized by vertex waves, sleep spindles (12 to 14 Hz), maximal frontocentral region. EEG showed continuous sharply contoured 3 to 6 Hz theta-delta slowing in right parietal region consistent with breach artifact. Sharp waves were noted in right parietal region. Hyperventilation and photic stimulation were not performed.   ABNORMALITY - Sharp wave, right parietal region - Continuous slow,  right parietal region IMPRESSION: This study showed evidence of epileptogenicity arising from right parietal region. Additionally there is cortical dysfunction arising from right parietal region consistent with underlying craniotomy. No seizures were seen throughout the recording. Charlsie Quest   MR Brain W and Wo Contrast Result Date: 04/07/2023 CLINICAL  DATA:  Provided history: Neuro deficit, persistent/recurrent, CNS neoplasm suspected. Diffuse large B-cell lymphoma. Left-sided weakness with midline shift on CT. EXAM: MRI HEAD WITHOUT AND WITH CONTRAST TECHNIQUE: Multiplanar, multiecho pulse sequences of the brain and surrounding structures were obtained without and with intravenous  contrast. CONTRAST:  8mL GADAVIST GADOBUTROL 1 MMOL/ML IV SOLN COMPARISON:  Head CT 04/07/2023.  Brain MRI 03/07/2023 neck FINDINGS: Intermittently motion degraded examination. Most notably, the axial T1 post-contrast sequence and coronal T2 sequence are moderately motion degraded. Within this limitation findings are as follows. Brain: Since the prior MRI 06/05/2023, there has been right parietal craniotomy for biopsy/debulking of an enhancing right parietal lobe tumor. Non-acute blood products at the operative site. The residual enhancing tumor measures 5.4 x 3.6 x 4.4 cm (previously 4.4 x 3.0 x 3.3 when remeasured on prior). Prominent surrounding edema slightly increased. 6 mm leftward midline shift has also increased (previously 4 mm). Persistent effacement of portions of the right lateral ventricle. Persistent asymmetric prominence of the right lateral ventricle temporal horn consistent with ventricular entrapment. Trace postoperative subdural collection along the anterior right cerebral hemisphere. Mild-to-moderate multifocal T2 FLAIR hyperintense signal abnormality elsewhere within the cerebral white matter, nonspecific but compatible with chronic small vessel ischemic disease. There is no acute infarct. Vascular: Maintained flow voids within the proximal large arterial vessels. Skull and upper cervical spine: Right parietal cranioplasty. No focal worrisome marrow lesion. Sinuses/orbits: No orbital mass or acute orbital finding.Mild mucosal thickening within the left maxillary sinus. IMPRESSION: 1. Motion degraded examination. 2. Since the prior brain MRI 03/07/2023, there has been right parietal craniotomy for biopsy/debulking of an enhancing right parietal lobe tumor. The residual tumor has increased in size, now measuring 5.4 x 3.6 x 4.4 cm (previously 4.4 x 3.0 x 3.3 cm). Prominent surrounding edema has slightly increased. 6 mm leftward midline shift has also increased. Unchanged asymmetric prominence of  the right lateral ventricle temporal horn consistent with ventricular entrapment. Electronically Signed   By: Jackey Loge D.O.   On: 04/07/2023 14:01   CT Head Wo Contrast Result Date: 04/07/2023 CLINICAL DATA:  Facial paralysis/weakness (CN 7) EXAM: CT HEAD WITHOUT CONTRAST TECHNIQUE: Contiguous axial images were obtained from the base of the skull through the vertex without intravenous contrast. RADIATION DOSE REDUCTION: This exam was performed according to the departmental dose-optimization program which includes automated exposure control, adjustment of the mA and/or kV according to patient size and/or use of iterative reconstruction technique. COMPARISON:  Head CT 03/06/2023, brain MR 03/07/2023 FINDINGS: Brain: No hemorrhage. No hydrocephalus. No extra-axial fluid collection postsurgical changes from a right parietal craniotomy with interval increase in size of the residual/recurrent tumor at the craniotomy site, now measuring up to 4.7 x 2.7 cm, previously 3.2 x 2.5 cm, when measured in a similar orientation. There is increased leftward midline shift, now measuring up to 6 mm, previously 3 mm. Unchanged disproportionate enlargement of the right temporal horn, worrisome for ventricular entrapment. Vascular: No hyperdense vessel or unexpected calcification. Skull: Normal. Negative for fracture or focal lesion. Status post parietal craniotomy Sinuses/Orbits: No middle ear or mastoid effusion. Mucosal thickening in the bilateral maxillary sinuses, right-greater-than-left orbits are unremarkable. Other: None. IMPRESSION: 1. Postsurgical changes from a right parietal craniotomy with interval increase in size of the residual/recurrent tumor at the craniotomy site, now measuring up to 4.7 x 2.7 cm, previously 3.2 x 2.5 cm. There is increased leftward midline shift, now measuring up to 6 mm, previously 3 mm. Recommend further evaluation with brain MRI.  2. Unchanged asymmetric enlargement of the right temporal  horn, compatible with ventricular entrapment Electronically Signed   By: Lorenza Cambridge M.D.   On: 04/07/2023 10:57    ASSESSMENT & PLAN:   73 year old male with:  #1 High grade large b cell lymphoma  No evidence of double hit or triple hit lymphoma. Typically would have done daEPOCH-R but with his r/o MDS high risk of cytopenias esp with etoposide Status post mini CHOP on 08/06/2022.  Expect nadir in  blood counts 10 to 12 days out.  #2 h/o Low grade MDS count had imrproved.  #3 Paroxysmal A fib-- previously not a candidate for anticoag due to anemia and thrombocytopenia-- now these have resolved. Patient notes he has f/u with his cardiology for this.  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 x 3.1 x 3.1 cm mass  PLAN:  -Discussed lab results on 04/18/23 in detail with patient. CBC normal, showed WBC of 6.9K, hemoglobin of 14.4, and platelets of 119K. -04/16/2023 PET scan has not yet been officially read at this time. There does not appear to be any other obvious systemic recurrence besides his CNS relapse primarily in the brain. Will await official radiologist reading of PET scan.  -CMP is pending time of clinical visit -He was noted to have some elevation of liver functions with treatment, which we shall continue to monitor -did not feel any enlarged lymph nodes during physical examination -will plan for single agent treatment every 2 weeks for at least 4 cycles.  -will plan for repeat brain scan after 2 cycles of treatment -will set up rituxan antibody between treatments -discussed that depending on his response after 2 cycles, there may be a role to add additional medications -discussed that after he completes his treatment cycles, there may be a role for referral to a lymphoma CAR  T-cell therapist at [facility] academic center to reduce the risk of recurrence -educated patient on the purposes of his catheter -few drops of blood in urine is most likely related to mild irritation from catheter, which should hopefully settle down on its own -discussed goal to avoid catheters with subsequent admissions -recommend urine sample to ensure patient does not have a urinary infection given burning sensation with urination -recommend drinking at least 2L of water daily -continue to eat and drink as well as possible -answered all of patient's and his son's question in detail  UA/Ucx -- no UTI  FOLLOW-UP: UA, UCx today Will set up for C2 of HD methotrexate as inpatient and outpatient C2D7 Rituxan  The total time spent in the appointment was 30 minutes* .  All of the patient's questions were answered with apparent satisfaction. The patient knows to call the clinic with any problems, questions or concerns.   Wyvonnia Lora MD MS AAHIVMS [facility] Hematology/Oncology Physician [facility]  .*Total Encounter Time as defined by the Centers for Medicare and Medicaid Services includes, in addition to the face-to-face time of a patient visit (documented in the note above) non-face-to-face time: obtaining and reviewing outside history, ordering and reviewing medications, tests or procedures, care coordination (communications with other health care professionals or caregivers) and documentation in the medical record.    I,Mitra Faeizi,acting as a Neurosurgeon for [employer]  Candise Che, MD.,have documented all relevant documentation on the behalf of Wyvonnia Lora, MD,as directed by  Wyvonnia Lora, MD while in the presence of Wyvonnia Lora, MD.  .I have reviewed the above documentation for accuracy and completeness, and I agree with the above. Johney Maine MD

## 2023-04-19 ENCOUNTER — Other Ambulatory Visit: Payer: Self-pay

## 2023-04-19 LAB — URINE CULTURE: Culture: <10000 — AB

## 2023-04-22 ENCOUNTER — Other Ambulatory Visit: Payer: Self-pay

## 2023-04-22 ENCOUNTER — Inpatient Hospital Stay (HOSPITAL_COMMUNITY)
Admission: AD | Admit: 2023-04-22 | Discharge: 2023-05-24 | DRG: 846 | Disposition: A | Discharge status: Hospice | Payer: 59 | Source: Ambulatory Visit | Attending: Student | Admitting: Student

## 2023-04-22 ENCOUNTER — Encounter: Payer: Self-pay | Admitting: Hematology

## 2023-04-22 ENCOUNTER — Encounter (HOSPITAL_COMMUNITY): Payer: Self-pay | Admitting: Hematology

## 2023-04-22 DIAGNOSIS — Z823 Family history of stroke: Secondary | ICD-10-CM

## 2023-04-22 DIAGNOSIS — N179 Acute kidney failure, unspecified: Secondary | ICD-10-CM | POA: Diagnosis present

## 2023-04-22 DIAGNOSIS — B965 Pseudomonas (aeruginosa) (mallei) (pseudomallei) as the cause of diseases classified elsewhere: Secondary | ICD-10-CM | POA: Diagnosis present

## 2023-04-22 DIAGNOSIS — Z8546 Personal history of malignant neoplasm of prostate: Secondary | ICD-10-CM | POA: Diagnosis not present

## 2023-04-22 DIAGNOSIS — T17908A Unspecified foreign body in respiratory tract, part unspecified causing other injury, initial encounter: Secondary | ICD-10-CM | POA: Diagnosis present

## 2023-04-22 DIAGNOSIS — C760 Malignant neoplasm of head, face and neck: Secondary | ICD-10-CM | POA: Diagnosis not present

## 2023-04-22 DIAGNOSIS — D469 Myelodysplastic syndrome, unspecified: Secondary | ICD-10-CM | POA: Diagnosis present

## 2023-04-22 DIAGNOSIS — Z79899 Other long term (current) drug therapy: Secondary | ICD-10-CM | POA: Diagnosis not present

## 2023-04-22 DIAGNOSIS — I2510 Atherosclerotic heart disease of native coronary artery without angina pectoris: Secondary | ICD-10-CM | POA: Diagnosis present

## 2023-04-22 DIAGNOSIS — J9811 Atelectasis: Secondary | ICD-10-CM | POA: Diagnosis not present

## 2023-04-22 DIAGNOSIS — I6389 Other cerebral infarction: Secondary | ICD-10-CM | POA: Diagnosis not present

## 2023-04-22 DIAGNOSIS — Z888 Allergy status to other drugs, medicaments and biological substances status: Secondary | ICD-10-CM

## 2023-04-22 DIAGNOSIS — R1310 Dysphagia, unspecified: Secondary | ICD-10-CM | POA: Diagnosis not present

## 2023-04-22 DIAGNOSIS — J9601 Acute respiratory failure with hypoxia: Secondary | ICD-10-CM | POA: Diagnosis not present

## 2023-04-22 DIAGNOSIS — D72819 Decreased white blood cell count, unspecified: Secondary | ICD-10-CM | POA: Diagnosis not present

## 2023-04-22 DIAGNOSIS — D61810 Antineoplastic chemotherapy induced pancytopenia: Secondary | ICD-10-CM | POA: Diagnosis not present

## 2023-04-22 DIAGNOSIS — I110 Hypertensive heart disease with heart failure: Secondary | ICD-10-CM | POA: Diagnosis present

## 2023-04-22 DIAGNOSIS — Z6830 Body mass index (BMI) 30.0-30.9, adult: Secondary | ICD-10-CM

## 2023-04-22 DIAGNOSIS — I480 Paroxysmal atrial fibrillation: Secondary | ICD-10-CM | POA: Diagnosis present

## 2023-04-22 DIAGNOSIS — D649 Anemia, unspecified: Secondary | ICD-10-CM | POA: Diagnosis not present

## 2023-04-22 DIAGNOSIS — Z7189 Other specified counseling: Secondary | ICD-10-CM | POA: Diagnosis not present

## 2023-04-22 DIAGNOSIS — I739 Peripheral vascular disease, unspecified: Secondary | ICD-10-CM | POA: Diagnosis present

## 2023-04-22 DIAGNOSIS — Z515 Encounter for palliative care: Secondary | ICD-10-CM

## 2023-04-22 DIAGNOSIS — C729 Malignant neoplasm of central nervous system, unspecified: Secondary | ICD-10-CM | POA: Diagnosis not present

## 2023-04-22 DIAGNOSIS — R4182 Altered mental status, unspecified: Secondary | ICD-10-CM | POA: Diagnosis not present

## 2023-04-22 DIAGNOSIS — Z8572 Personal history of non-Hodgkin lymphomas: Secondary | ICD-10-CM | POA: Diagnosis not present

## 2023-04-22 DIAGNOSIS — G936 Cerebral edema: Secondary | ICD-10-CM | POA: Diagnosis present

## 2023-04-22 DIAGNOSIS — R9089 Other abnormal findings on diagnostic imaging of central nervous system: Secondary | ICD-10-CM | POA: Diagnosis not present

## 2023-04-22 DIAGNOSIS — Z8673 Personal history of transient ischemic attack (TIA), and cerebral infarction without residual deficits: Secondary | ICD-10-CM

## 2023-04-22 DIAGNOSIS — Z5111 Encounter for antineoplastic chemotherapy: Principal | ICD-10-CM

## 2023-04-22 DIAGNOSIS — G8194 Hemiplegia, unspecified affecting left nondominant side: Secondary | ICD-10-CM | POA: Diagnosis not present

## 2023-04-22 DIAGNOSIS — C8339 Primary central nervous system lymphoma: Secondary | ICD-10-CM | POA: Diagnosis present

## 2023-04-22 DIAGNOSIS — G40909 Epilepsy, unspecified, not intractable, without status epilepticus: Secondary | ICD-10-CM | POA: Diagnosis present

## 2023-04-22 DIAGNOSIS — I611 Nontraumatic intracerebral hemorrhage in hemisphere, cortical: Secondary | ICD-10-CM | POA: Diagnosis not present

## 2023-04-22 DIAGNOSIS — E66811 Obesity, class 1: Secondary | ICD-10-CM | POA: Diagnosis present

## 2023-04-22 DIAGNOSIS — J9611 Chronic respiratory failure with hypoxia: Secondary | ICD-10-CM | POA: Diagnosis not present

## 2023-04-22 DIAGNOSIS — B181 Chronic viral hepatitis B without delta-agent: Secondary | ICD-10-CM | POA: Diagnosis present

## 2023-04-22 DIAGNOSIS — C8338 Diffuse large B-cell lymphoma, lymph nodes of multiple sites: Secondary | ICD-10-CM | POA: Diagnosis present

## 2023-04-22 DIAGNOSIS — R569 Unspecified convulsions: Secondary | ICD-10-CM

## 2023-04-22 DIAGNOSIS — I5032 Chronic diastolic (congestive) heart failure: Secondary | ICD-10-CM | POA: Diagnosis present

## 2023-04-22 DIAGNOSIS — Z66 Do not resuscitate: Secondary | ICD-10-CM | POA: Diagnosis present

## 2023-04-22 DIAGNOSIS — Z452 Encounter for adjustment and management of vascular access device: Secondary | ICD-10-CM | POA: Diagnosis not present

## 2023-04-22 DIAGNOSIS — I63519 Cerebral infarction due to unspecified occlusion or stenosis of unspecified middle cerebral artery: Secondary | ICD-10-CM | POA: Diagnosis not present

## 2023-04-22 DIAGNOSIS — E876 Hypokalemia: Secondary | ICD-10-CM | POA: Diagnosis present

## 2023-04-22 DIAGNOSIS — R509 Fever, unspecified: Secondary | ICD-10-CM | POA: Diagnosis not present

## 2023-04-22 DIAGNOSIS — Z7982 Long term (current) use of aspirin: Secondary | ICD-10-CM

## 2023-04-22 DIAGNOSIS — G9389 Other specified disorders of brain: Secondary | ICD-10-CM | POA: Diagnosis not present

## 2023-04-22 DIAGNOSIS — R221 Localized swelling, mass and lump, neck: Secondary | ICD-10-CM

## 2023-04-22 DIAGNOSIS — M4807 Spinal stenosis, lumbosacral region: Secondary | ICD-10-CM | POA: Diagnosis present

## 2023-04-22 DIAGNOSIS — R0602 Shortness of breath: Secondary | ICD-10-CM | POA: Diagnosis not present

## 2023-04-22 DIAGNOSIS — R001 Bradycardia, unspecified: Secondary | ICD-10-CM | POA: Diagnosis not present

## 2023-04-22 DIAGNOSIS — I10 Essential (primary) hypertension: Secondary | ICD-10-CM | POA: Diagnosis not present

## 2023-04-22 DIAGNOSIS — R29810 Facial weakness: Secondary | ICD-10-CM | POA: Diagnosis not present

## 2023-04-22 DIAGNOSIS — S2232XA Fracture of one rib, left side, initial encounter for closed fracture: Secondary | ICD-10-CM | POA: Diagnosis not present

## 2023-04-22 DIAGNOSIS — J984 Other disorders of lung: Secondary | ICD-10-CM | POA: Diagnosis not present

## 2023-04-22 DIAGNOSIS — R739 Hyperglycemia, unspecified: Secondary | ICD-10-CM | POA: Diagnosis present

## 2023-04-22 DIAGNOSIS — D638 Anemia in other chronic diseases classified elsewhere: Secondary | ICD-10-CM | POA: Diagnosis not present

## 2023-04-22 DIAGNOSIS — R220 Localized swelling, mass and lump, head: Secondary | ICD-10-CM | POA: Diagnosis not present

## 2023-04-22 DIAGNOSIS — M4852XA Collapsed vertebra, not elsewhere classified, cervical region, initial encounter for fracture: Secondary | ICD-10-CM | POA: Diagnosis not present

## 2023-04-22 DIAGNOSIS — M7989 Other specified soft tissue disorders: Secondary | ICD-10-CM | POA: Diagnosis not present

## 2023-04-22 DIAGNOSIS — Z4682 Encounter for fitting and adjustment of non-vascular catheter: Secondary | ICD-10-CM | POA: Diagnosis not present

## 2023-04-22 DIAGNOSIS — J690 Pneumonitis due to inhalation of food and vomit: Secondary | ICD-10-CM | POA: Diagnosis not present

## 2023-04-22 DIAGNOSIS — G9340 Encephalopathy, unspecified: Secondary | ICD-10-CM | POA: Diagnosis not present

## 2023-04-22 DIAGNOSIS — R918 Other nonspecific abnormal finding of lung field: Secondary | ICD-10-CM | POA: Diagnosis not present

## 2023-04-22 DIAGNOSIS — G5139 Clonic hemifacial spasm, unspecified: Secondary | ICD-10-CM | POA: Diagnosis not present

## 2023-04-22 DIAGNOSIS — J9859 Other diseases of mediastinum, not elsewhere classified: Secondary | ICD-10-CM | POA: Diagnosis not present

## 2023-04-22 DIAGNOSIS — D496 Neoplasm of unspecified behavior of brain: Secondary | ICD-10-CM

## 2023-04-22 DIAGNOSIS — G40109 Localization-related (focal) (partial) symptomatic epilepsy and epileptic syndromes with simple partial seizures, not intractable, without status epilepticus: Secondary | ICD-10-CM | POA: Diagnosis not present

## 2023-04-22 DIAGNOSIS — T451X5A Adverse effect of antineoplastic and immunosuppressive drugs, initial encounter: Secondary | ICD-10-CM | POA: Diagnosis present

## 2023-04-22 DIAGNOSIS — I639 Cerebral infarction, unspecified: Secondary | ICD-10-CM | POA: Diagnosis not present

## 2023-04-22 DIAGNOSIS — R4701 Aphasia: Secondary | ICD-10-CM | POA: Diagnosis not present

## 2023-04-22 DIAGNOSIS — J9600 Acute respiratory failure, unspecified whether with hypoxia or hypercapnia: Secondary | ICD-10-CM | POA: Diagnosis not present

## 2023-04-22 DIAGNOSIS — R0989 Other specified symptoms and signs involving the circulatory and respiratory systems: Secondary | ICD-10-CM | POA: Diagnosis not present

## 2023-04-22 DIAGNOSIS — J9610 Chronic respiratory failure, unspecified whether with hypoxia or hypercapnia: Secondary | ICD-10-CM

## 2023-04-22 DIAGNOSIS — I517 Cardiomegaly: Secondary | ICD-10-CM | POA: Diagnosis not present

## 2023-04-22 DIAGNOSIS — C8510 Unspecified B-cell lymphoma, unspecified site: Secondary | ICD-10-CM | POA: Diagnosis not present

## 2023-04-22 DIAGNOSIS — Z86711 Personal history of pulmonary embolism: Secondary | ICD-10-CM

## 2023-04-22 DIAGNOSIS — K1230 Oral mucositis (ulcerative), unspecified: Secondary | ICD-10-CM

## 2023-04-22 DIAGNOSIS — Z9079 Acquired absence of other genital organ(s): Secondary | ICD-10-CM

## 2023-04-22 DIAGNOSIS — R0689 Other abnormalities of breathing: Secondary | ICD-10-CM | POA: Diagnosis not present

## 2023-04-22 DIAGNOSIS — Z86718 Personal history of other venous thrombosis and embolism: Secondary | ICD-10-CM

## 2023-04-22 LAB — CBC WITH DIFFERENTIAL/PLATELET
Abs Immature Granulocytes: 0.04 10*3/uL (ref 0.00–0.07)
Basophils Absolute: 0 10*3/uL (ref 0.0–0.1)
Basophils Relative: 0 %
Eosinophils Absolute: 0 10*3/uL (ref 0.0–0.5)
Eosinophils Relative: 0 %
HCT: 41.5 % (ref 39.0–52.0)
Hemoglobin: 13.5 g/dL (ref 13.0–17.0)
Immature Granulocytes: 1 %
Lymphocytes Relative: 5 %
Lymphs Abs: 0.3 10*3/uL — ABNORMAL LOW (ref 0.7–4.0)
MCH: 29.9 pg (ref 26.0–34.0)
MCHC: 32.5 g/dL (ref 30.0–36.0)
MCV: 91.8 fL (ref 80.0–100.0)
Monocytes Absolute: 0.4 10*3/uL (ref 0.1–1.0)
Monocytes Relative: 6 %
Neutro Abs: 5.4 10*3/uL (ref 1.7–7.7)
Neutrophils Relative %: 88 %
Platelets: 130 10*3/uL — ABNORMAL LOW (ref 150–400)
RBC: 4.52 MIL/uL (ref 4.22–5.81)
RDW: 12.9 % (ref 11.5–15.5)
WBC: 6.1 10*3/uL (ref 4.0–10.5)
nRBC: 0 % (ref 0.0–0.2)

## 2023-04-22 LAB — COMPREHENSIVE METABOLIC PANEL
ALT: 45 U/L — ABNORMAL HIGH (ref 0–44)
AST: 19 U/L (ref 15–41)
Albumin: 3.3 g/dL — ABNORMAL LOW (ref 3.5–5.0)
Alkaline Phosphatase: 80 U/L (ref 38–126)
Anion gap: 11 (ref 5–15)
BUN: 23 mg/dL (ref 8–23)
CO2: 21 mmol/L — ABNORMAL LOW (ref 22–32)
Calcium: 8.4 mg/dL — ABNORMAL LOW (ref 8.9–10.3)
Chloride: 103 mmol/L (ref 98–111)
Creatinine, Ser: 0.9 mg/dL (ref 0.61–1.24)
GFR, Estimated: >60 mL/min (ref >60)
Glucose, Bld: 130 mg/dL — ABNORMAL HIGH (ref 70–99)
Potassium: 3.4 mmol/L — ABNORMAL LOW (ref 3.5–5.1)
Sodium: 135 mmol/L (ref 135–145)
Total Bilirubin: 0.9 mg/dL (ref 0.0–1.2)
Total Protein: 5.8 g/dL — ABNORMAL LOW (ref 6.5–8.1)

## 2023-04-22 LAB — PHOSPHORUS: Phosphorus: 3 mg/dL (ref 2.5–4.6)

## 2023-04-22 LAB — MAGNESIUM: Magnesium: 1.8 mg/dL (ref 1.7–2.4)

## 2023-04-22 LAB — URINALYSIS, DIPSTICK ONLY
Bilirubin Urine: NEGATIVE
Glucose, UA: NEGATIVE mg/dL
Hgb urine dipstick: NEGATIVE
Ketones, ur: NEGATIVE mg/dL
Leukocytes,Ua: NEGATIVE
Nitrite: NEGATIVE
Protein, ur: NEGATIVE mg/dL
Specific Gravity, Urine: 1.003 — ABNORMAL LOW (ref 1.005–1.030)
pH: 6 (ref 5.0–8.0)

## 2023-04-22 LAB — URIC ACID: Uric Acid, Serum: 5.7 mg/dL (ref 3.7–8.6)

## 2023-04-22 MED ORDER — VITAMIN D 25 MCG (1000 UNIT) PO TABS
2000.0000 [IU] | ORAL_TABLET | Freq: Every day | ORAL | Status: DC
  Administered 2023-04-23 – 2023-04-27 (×5): 2000 [IU] via ORAL
  Filled 2023-04-22 (×5): qty 2

## 2023-04-22 MED ORDER — SODIUM CHLORIDE 0.9 % IV SOLN: 8.0000 mg | Freq: Three times a day (TID) | INTRAVENOUS | Status: DC | PRN

## 2023-04-22 MED ORDER — ENOXAPARIN SODIUM 40 MG/0.4ML IJ SOSY
40.0000 mg | PREFILLED_SYRINGE | INTRAMUSCULAR | Status: DC
Start: 2023-04-22 — End: 2023-05-20
  Administered 2023-04-22 – 2023-05-19 (×28): 40 mg via SUBCUTANEOUS
  Filled 2023-04-22 (×29): qty 0.4

## 2023-04-22 MED ORDER — TENOFOVIR ALAFENAMIDE FUMARATE 25 MG PO TABS
25.0000 mg | ORAL_TABLET | Freq: Every day | ORAL | Status: DC
Start: 2023-04-23 — End: 2023-04-27
  Administered 2023-04-23 – 2023-04-27 (×5): 25 mg via ORAL
  Filled 2023-04-22 (×5): qty 1

## 2023-04-22 MED ORDER — SENNOSIDES-DOCUSATE SODIUM 8.6-50 MG PO TABS
1.0000 | ORAL_TABLET | Freq: Every evening | ORAL | Status: DC | PRN
Start: 2023-04-22 — End: 2023-04-29

## 2023-04-22 MED ORDER — ONDANSETRON 4 MG PO TBDP
4.0000 mg | ORAL_TABLET | Freq: Three times a day (TID) | ORAL | Status: DC | PRN
Start: 2023-04-22 — End: 2023-04-29

## 2023-04-22 MED ORDER — METOPROLOL SUCCINATE ER 25 MG PO TB24
25.0000 mg | ORAL_TABLET | Freq: Every day | ORAL | Status: DC
  Administered 2023-04-23 – 2023-04-25 (×3): 25 mg via ORAL
  Filled 2023-04-22 (×3): qty 1

## 2023-04-22 MED ORDER — ACETAMINOPHEN 325 MG PO TABS
650.0000 mg | ORAL_TABLET | ORAL | Status: DC | PRN
Start: 2023-04-22 — End: 2023-04-26
  Administered 2023-04-22 – 2023-04-25 (×3): 650 mg via ORAL
  Filled 2023-04-22 (×2): qty 2

## 2023-04-22 MED ORDER — GUAIFENESIN-DM 100-10 MG/5ML PO SYRP
10.0000 mL | ORAL_SOLUTION | ORAL | Status: DC | PRN
Start: 2023-04-22 — End: 2023-04-27

## 2023-04-22 MED ORDER — ONDANSETRON HCL 4 MG PO TABS
4.0000 mg | ORAL_TABLET | Freq: Three times a day (TID) | ORAL | Status: DC | PRN
Start: 2023-04-22 — End: 2023-04-29

## 2023-04-22 MED ORDER — LEVETIRACETAM 500 MG PO TABS
500.0000 mg | ORAL_TABLET | Freq: Two times a day (BID) | ORAL | Status: DC
Start: 2023-04-22 — End: 2023-04-24
  Administered 2023-04-22 – 2023-04-23 (×3): 500 mg via ORAL
  Filled 2023-04-22 (×3): qty 1

## 2023-04-22 MED ORDER — DEXTROSE 5 % IV SOLN
INTRAVENOUS | Status: DC
  Filled 2023-04-22 (×5): qty 1000
  Filled 2023-04-22: qty 150
  Filled 2023-04-22 (×2): qty 1000

## 2023-04-22 MED ORDER — B COMPLEX-C PO TABS
1.0000 | ORAL_TABLET | Freq: Every day | ORAL | Status: DC
  Administered 2023-04-23 – 2023-04-25 (×3): 1 via ORAL
  Filled 2023-04-22 (×4): qty 1

## 2023-04-22 MED ORDER — HYDROCORTISONE (PERIANAL) 2.5 % EX CREA: 1.0000 | TOPICAL_CREAM | Freq: Two times a day (BID) | CUTANEOUS | Status: DC | PRN

## 2023-04-22 MED ORDER — PANTOPRAZOLE SODIUM 40 MG PO TBEC: 40.0000 mg | DELAYED_RELEASE_TABLET | Freq: Two times a day (BID) | ORAL | Status: DC

## 2023-04-22 MED ORDER — ALUM & MAG HYDROXIDE-SIMETH 200-200-20 MG/5ML PO SUSP: 60.0000 mL | ORAL | Status: DC | PRN

## 2023-04-22 MED ORDER — ONDANSETRON HCL 4 MG/2ML IJ SOLN
4.0000 mg | Freq: Three times a day (TID) | INTRAMUSCULAR | Status: DC | PRN
  Administered 2023-04-24 – 2023-04-25 (×2): 4 mg via INTRAVENOUS
  Filled 2023-04-22 (×2): qty 2

## 2023-04-23 DIAGNOSIS — E876 Hypokalemia: Secondary | ICD-10-CM | POA: Diagnosis not present

## 2023-04-23 DIAGNOSIS — C8339 Primary central nervous system lymphoma: Secondary | ICD-10-CM | POA: Diagnosis not present

## 2023-04-23 DIAGNOSIS — Z5111 Encounter for antineoplastic chemotherapy: Secondary | ICD-10-CM | POA: Diagnosis not present

## 2023-04-23 LAB — CBC WITH DIFFERENTIAL/PLATELET
Abs Immature Granulocytes: 0.05 10*3/uL (ref 0.00–0.07)
Basophils Absolute: 0 10*3/uL (ref 0.0–0.1)
Basophils Relative: 0 %
Eosinophils Absolute: 0 10*3/uL (ref 0.0–0.5)
Eosinophils Relative: 0 %
HCT: 38.9 % — ABNORMAL LOW (ref 39.0–52.0)
Hemoglobin: 13 g/dL (ref 13.0–17.0)
Immature Granulocytes: 1 %
Lymphocytes Relative: 16 %
Lymphs Abs: 0.9 10*3/uL (ref 0.7–4.0)
MCH: 30 pg (ref 26.0–34.0)
MCHC: 33.4 g/dL (ref 30.0–36.0)
MCV: 89.8 fL (ref 80.0–100.0)
Monocytes Absolute: 0.6 10*3/uL (ref 0.1–1.0)
Monocytes Relative: 11 %
Neutro Abs: 3.8 10*3/uL (ref 1.7–7.7)
Neutrophils Relative %: 72 %
Platelets: 135 10*3/uL — ABNORMAL LOW (ref 150–400)
RBC: 4.33 MIL/uL (ref 4.22–5.81)
RDW: 12.7 % (ref 11.5–15.5)
WBC: 5.2 10*3/uL (ref 4.0–10.5)
nRBC: 0 % (ref 0.0–0.2)

## 2023-04-23 LAB — COMPREHENSIVE METABOLIC PANEL
ALT: 34 U/L (ref 0–44)
AST: 17 U/L (ref 15–41)
Albumin: 2.7 g/dL — ABNORMAL LOW (ref 3.5–5.0)
Alkaline Phosphatase: 70 U/L (ref 38–126)
Anion gap: 7 (ref 5–15)
BUN: 25 mg/dL — ABNORMAL HIGH (ref 8–23)
CO2: 32 mmol/L (ref 22–32)
Calcium: 7.8 mg/dL — ABNORMAL LOW (ref 8.9–10.3)
Chloride: 98 mmol/L (ref 98–111)
Creatinine, Ser: 0.89 mg/dL (ref 0.61–1.24)
GFR, Estimated: >60 mL/min (ref >60)
Glucose, Bld: 99 mg/dL (ref 70–99)
Potassium: 3.1 mmol/L — ABNORMAL LOW (ref 3.5–5.1)
Sodium: 137 mmol/L (ref 135–145)
Total Bilirubin: 0.7 mg/dL (ref 0.0–1.2)
Total Protein: 4.7 g/dL — ABNORMAL LOW (ref 6.5–8.1)

## 2023-04-23 LAB — URINALYSIS, DIPSTICK ONLY
Bilirubin Urine: NEGATIVE
Glucose, UA: NEGATIVE mg/dL
Hgb urine dipstick: NEGATIVE
Ketones, ur: NEGATIVE mg/dL
Leukocytes,Ua: NEGATIVE
Nitrite: NEGATIVE
Protein, ur: NEGATIVE mg/dL
Specific Gravity, Urine: 1.019 (ref 1.005–1.030)
pH: 7 (ref 5.0–8.0)

## 2023-04-23 LAB — METHOTREXATE: Methotrexate: <0.05

## 2023-04-23 MED ORDER — SODIUM CHLORIDE 0.9 % IV SOLN
INTRAVENOUS | Status: DC
  Administered 2023-05-03: 10 mL/h via INTRAVENOUS

## 2023-04-23 MED ORDER — POTASSIUM CHLORIDE 20 MEQ PO PACK
40.0000 meq | PACK | Freq: Once | ORAL | Status: AC
  Administered 2023-04-23: 40 meq via ORAL
  Filled 2023-04-23: qty 2

## 2023-04-23 MED ORDER — HYDROCODONE-ACETAMINOPHEN 5-325 MG PO TABS
1.0000 | ORAL_TABLET | Freq: Four times a day (QID) | ORAL | Status: DC | PRN
  Administered 2023-04-23: 1 via ORAL
  Filled 2023-04-23: qty 1

## 2023-04-23 MED ORDER — METHOTREXATE SODIUM (PF) CHEMO INJECTION 1 GM/40ML
3.5000 g/m2 | Freq: Once | INTRAVENOUS | Status: AC
  Administered 2023-04-23: 6.965 g via INTRAVENOUS
  Filled 2023-04-23: qty 278.6

## 2023-04-23 MED ORDER — LIDOCAINE 4 % EX CREA
TOPICAL_CREAM | Freq: Three times a day (TID) | CUTANEOUS | Status: DC | PRN
  Filled 2023-04-23: qty 5

## 2023-04-23 MED ORDER — POTASSIUM CHLORIDE CRYS ER 20 MEQ PO TBCR
20.0000 meq | EXTENDED_RELEASE_TABLET | Freq: Two times a day (BID) | ORAL | Status: DC
  Administered 2023-04-23 – 2023-04-25 (×6): 20 meq via ORAL
  Filled 2023-04-23 (×6): qty 1

## 2023-04-23 MED ORDER — SODIUM CHLORIDE 0.9 % IV SOLN
Freq: Once | INTRAVENOUS | Status: AC
  Administered 2023-04-23: 36 mg via INTRAVENOUS
  Filled 2023-04-23: qty 8

## 2023-04-23 NOTE — Plan of Care (Signed)
 Problem: Education: Goal: Knowledge of General Education information will improve Description: Including pain rating scale, medication(s)/side effects and non-pharmacologic comfort measures 04/23/2023 0559 by Gaynelle Arabian, RN Outcome: Progressing 04/23/2023 0558 by Gaynelle Arabian, RN Outcome: Progressing   Problem: Health Behavior/Discharge Planning: Goal: Ability to manage health-related needs will improve 04/23/2023 0559 by Gaynelle Arabian, RN Outcome: Progressing 04/23/2023 0558 by Gaynelle Arabian, RN Outcome: Progressing   Problem: Clinical Measurements: Goal: Ability to maintain clinical measurements within normal limits will improve 04/23/2023 0559 by Gaynelle Arabian, RN Outcome: Progressing 04/23/2023 0558 by Gaynelle Arabian, RN Outcome: Progressing Goal: Will remain free from infection 04/23/2023 0559 by Gaynelle Arabian, RN Outcome: Progressing 04/23/2023 0558 by Gaynelle Arabian, RN Outcome: Progressing Goal: Diagnostic test results will improve 04/23/2023 0559 by Gaynelle Arabian, RN Outcome: Progressing 04/23/2023 0558 by Gaynelle Arabian, RN Outcome: Progressing Goal: Respiratory complications will improve 04/23/2023 0559 by Gaynelle Arabian, RN Outcome: Progressing 04/23/2023 0558 by Gaynelle Arabian, RN Outcome: Progressing Goal: Cardiovascular complication will be avoided 04/23/2023 0559 by Gaynelle Arabian, RN Outcome: Progressing 04/23/2023 0558 by Gaynelle Arabian, RN Outcome: Progressing   Problem: Activity: Goal: Risk for activity intolerance will decrease 04/23/2023 0559 by Gaynelle Arabian, RN Outcome: Progressing 04/23/2023 0558 by Gaynelle Arabian, RN Outcome: Progressing   Problem: Nutrition: Goal: Adequate nutrition will be maintained 04/23/2023 0559 by Gaynelle Arabian, RN Outcome: Progressing 04/23/2023 0558 by Gaynelle Arabian, RN Outcome: Progressing   Problem: Coping: Goal: Level of anxiety will  decrease 04/23/2023 0559 by Gaynelle Arabian, RN Outcome: Progressing 04/23/2023 0558 by Gaynelle Arabian, RN Outcome: Progressing   Problem: Elimination: Goal: Will not experience complications related to bowel motility 04/23/2023 0559 by Gaynelle Arabian, RN Outcome: Progressing 04/23/2023 0558 by Gaynelle Arabian, RN Outcome: Progressing Goal: Will not experience complications related to urinary retention 04/23/2023 0559 by Gaynelle Arabian, RN Outcome: Progressing 04/23/2023 0558 by Gaynelle Arabian, RN Outcome: Progressing   Problem: Pain Managment: Goal: General experience of comfort will improve and/or be controlled 04/23/2023 0559 by Gaynelle Arabian, RN Outcome: Progressing 04/23/2023 0558 by Gaynelle Arabian, RN Outcome: Progressing   Problem: Safety: Goal: Ability to remain free from injury will improve 04/23/2023 0559 by Gaynelle Arabian, RN Outcome: Progressing 04/23/2023 0558 by Gaynelle Arabian, RN Outcome: Progressing   Problem: Skin Integrity: Goal: Risk for impaired skin integrity will decrease 04/23/2023 0559 by Gaynelle Arabian, RN Outcome: Progressing 04/23/2023 0558 by Gaynelle Arabian, RN Outcome: Progressing   Problem: Education: Goal: Knowledge of the prescribed therapeutic regimen will improve 04/23/2023 0559 by Gaynelle Arabian, RN Outcome: Progressing 04/23/2023 0558 by Gaynelle Arabian, RN Outcome: Progressing   Problem: Activity: Goal: Ability to implement measures to reduce episodes of fatigue will improve 04/23/2023 0559 by Gaynelle Arabian, RN Outcome: Progressing 04/23/2023 0558 by Gaynelle Arabian, RN Outcome: Progressing   Problem: Bowel/Gastric: Goal: Will not experience complications related to bowel motility 04/23/2023 0559 by Gaynelle Arabian, RN Outcome: Progressing 04/23/2023 0558 by Gaynelle Arabian, RN Outcome: Progressing   Problem: Coping: Goal: Ability to identify and develop effective coping  behavior will improve 04/23/2023 0559 by Gaynelle Arabian, RN Outcome: Progressing 04/23/2023 0558 by Gaynelle Arabian, RN Outcome: Progressing   Problem: Nutritional: Goal: Maintenance of adequate nutrition will improve 04/23/2023 0559 by Gaynelle Arabian, RN Outcome: Progressing 04/23/2023 0558 by Gaynelle Arabian, RN Outcome: Progressing

## 2023-04-23 NOTE — Progress Notes (Signed)
 Ok to proceed with methotrexate with urine pH=7 per Dr Candise Che

## 2023-04-23 NOTE — Progress Notes (Signed)
   04/23/23 1241  TOC Brief Assessment  Insurance and Status Reviewed  Patient has primary care physician Yes  Home environment has been reviewed [id number]  [place]  [id number]  Prior level of function: independent  Prior/Current Home Services No current home services  Social Drivers of Health Review SDOH reviewed no interventions necessary  Readmission risk has been reviewed Yes  Transition of care needs no transition of care needs at this time

## 2023-04-23 NOTE — Progress Notes (Addendum)
 Notified by patient's RN that PT is complaining of stinging and burning pain in his arms and legs. Not relieved by tylenol. RN requesting Lyrica. On review of his home meds patient is currently not on Lyrica and has never been on Lyrica. I informed the RN that Lyrica is usually not initiated inpatient due to potential life threatening side effects and may need to be titrated slowly and monitored closely. Patient is also on Keppra which may interact with Lyrica. Will start his home lidocaine, avoid Tramadol due to hx of seizures.   Webb Silversmith, DNP, CCRN, FNP-C, AGACNP-BC Acute Care & Family Nurse Practitioner  [facility]  See Amion for personal pager PCCM on call pager [phone] until 7 am

## 2023-04-23 NOTE — Progress Notes (Cosign Needed)
 Tyler [name]   DOB:1950-11-16   ZO#:[id number]      ASSESSMENT & PLAN:  1.  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  Also s/p craniotomy with gross total resection 03/10/23.  - CT of head 04/07/2023 showed interval increase in size of tumor at the craniotomy site.  MRI of head 04/07/2023 increase in size of tumor with slightly increased surrounding edema. - Currently on Hi-dose methotrexate chemotherapy regimen which is more aggressive approach.  Received cycle one 04/09/23. -Now admitted for cycle 2.  Plan to start IV methotrexate tomorrow if urine pH greater than or equal to 7. - Sodium bicarb IV and p.o. started.   - Continue to monitor urine pH daily.  Urine pH 7.0 today. - Continue to monitor methotrexate level daily until <0.1. - Will also need IV leucovorin rescue until methotrexate level <0.1 - Will plan for IV Rituxan as outpatient between second and third cycle of of methotrexate and subsequently between treatment cycles. - Continue daily labs - Medical oncology/Dr. Candise Che following closely.     2.  Partial sensory seizures, left side - C/o L-sided pain, tingling with burning sensation  - may be due to partial sensory seizures. Was seen by Neuro, s/p EEG last admission, no seizures seen. - Started on Keppra 500 mg po bid during last admission with siginificant improvement in symptoms per patient.  Should continue to improve with treatment for CNS lymphoma. - continue to monitor closely  3.  History of Altered mental status -Improved to baseline. - Likely due to CNS lymphoma with edema - Monitor closely   4. Thrombocytopenia - Mild, likely due to recent chemotherapy treatment - platelets 135k today - No intervention required at this time   5.  Elevated blood glucose  - Monitor blood sugar levels closely. -Primary team following    6.  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  A-fib - S/p Eliquis, d/c'd due to high risk for bleeding  - Follow closely with cardiology   8.  Prostate cancer - Diagnosed 2021   9.  Hep B antibody positive, chronic - On tenofovir for hep B reactivation suppression in context of Rituxan use.  Plan is to continue for 6 months after his last chemotherapy. - Follow ID outpatient  10.  VTE prophylaxis - With Lovenox   Code Status Full  Subjective:  Patient seen awake and alert laying supine in bed.  Reports that he feels okay.  Admits to intermittent left-sided tingling.  Also states that his eyes are itching today, discussed with nurse who states he was eating some food and rubbed his eyes with food in his hands.  Will continue to monitor this closely.  No other complaints and no distress noted.  Objective:  Vitals:   04/22/23 2242 04/23/23 0300  BP: 133/74 (!) 150/86  Pulse: 64 (!) 56  Resp: 14 14  Temp: 98.3 F (36.8 C) 97.9 F (36.6 C)  SpO2: 100% 100%     Intake/Output Summary (Last 24 hours) at 04/23/2023 9811 Last data filed at 04/23/2023 0200 Gross per 24 hour  Intake 240 ml  Output 610 ml  Net -370 ml     REVIEW OF SYSTEMS:   Constitutional: + Fatigue, + left-sided "tingling", denies fevers, chills or abnormal night sweats Eyes: Denies blurriness of vision, double vision or watery eyes  Ears, nose, mouth, throat, and face: Denies mucositis or sore throat Respiratory: Denies cough, dyspnea or wheezes Cardiovascular: Denies palpitation, chest discomfort or lower extremity swelling Gastrointestinal:  Denies nausea, heartburn or change in bowel habits Skin: Denies abnormal skin rashes Lymphatics: Denies new lymphadenopathy or easy bruising Neurological: Denies numbness, tingling or new weaknesses Behavioral/Psych: Mood is stable, no new changes  All other systems were reviewed with the patient and are negative.  PHYSICAL EXAMINATION: ECOG  PERFORMANCE STATUS: 2 - Symptomatic, <50% confined to bed  Vitals:   04/22/23 2242 04/23/23 0300  BP: 133/74 (!) 150/86  Pulse: 64 (!) 56  Resp: 14 14  Temp: 98.3 F (36.8 C) 97.9 F (36.6 C)  SpO2: 100% 100%   There were no vitals filed for this visit.  GENERAL: alert, no distress and comfortable SKIN: skin color, texture, turgor are normal, no rashes or significant lesions EYES: normal, conjunctiva are pink and non-injected, sclera clear OROPHARYNX: no exudate, no erythema and lips, buccal mucosa, and tongue normal  NECK: supple, thyroid normal size, non-tender, without nodularity LYMPH: no palpable lymphadenopathy in the cervical, axillary or inguinal LUNGS: clear to auscultation and percussion with normal breathing effort HEART: regular rate & rhythm and no murmurs and no lower extremity edema ABDOMEN: abdomen soft, non-tender and normal bowel sounds MUSCULOSKELETAL: no cyanosis of digits and no clubbing  PSYCH: alert & oriented x 3 with fluent speech NEURO: no focal motor/sensory deficits   All questions were answered. The patient knows to call the clinic with any problems, questions or concerns.   The total time spent in the appointment was 55 minutes encounter with patient including review of chart and various tests results, discussions about plan of care and coordination of care plan  Dawson Bills, NP 04/23/2023 9:27 AM    Labs Reviewed:  Lab Results  Component Value Date   WBC 5.2 04/23/2023   HGB 13.0 04/23/2023   HCT 38.9 (L) 04/23/2023   MCV 89.8 04/23/2023   PLT 135 (L) 04/23/2023   Recent Labs    08/11/22 0316 08/11/22 1544 08/12/22 0515 08/12/22 1400 08/20/22 0302 08/21/22 0334 04/18/23 1343 04/22/23 1546 04/23/23 0501  NA 134*   < > 137   < > 137   < > 134* 135 137  K 3.7   < > 3.3*   < > 4.0   < > 3.7 3.4* 3.1*  CL 99   < > 101   < > 102   < > 101 103 98  CO2 25   < > 28   < > 24   < > 26 21* 32  GLUCOSE 185*   < > 114*   < > 78   < > 121*  130* 99  BUN 52*   < > 41*   < > 45*   < > 23 23 25*  CREATININE 1.58*   < > 1.44*   < > 3.38*   < > 0.93 0.90 0.89  CALCIUM 7.4*   < > 7.5*   < > 7.2*   < > 8.6* 8.4* 7.8*  GFRNONAA 47*   < > 52*   < > 19*   < > >60 >60 >60  PROT 4.6*  --  4.2*  --  3.9*   < > 5.9* 5.8* 4.7*  ALBUMIN 1.8*  1.7*   < > 1.7*  1.6*   < > 1.7*   < > 3.9 3.3* 2.7*  AST 33  --  31  --  25   < > 19 19 17   ALT 31  --  31  --  18   < > 53* 45* 34  ALKPHOS 84  --  79  --  111   < > 93 80 70  BILITOT 3.2*  --  2.5*  --  0.8   < > 0.6 0.9 0.7  BILIDIR 1.7*  --  1.3*  --  0.2  --   --   --   --   IBILI 1.5*  --  1.2*  --  0.6  --   --   --   --    < > = values in this interval not displayed.    Studies Reviewed:  DG CHEST PORT 1 VIEW Result Date: 04/10/2023 CLINICAL DATA:  Shortness of breath.  History of lymphoma EXAM: PORTABLE CHEST 1 VIEW COMPARISON:  X-ray 08/11/2022.  PET-CT 12/27/2022 FINDINGS: Right IJ chest port with tip overlying the right atrium. The port is accessed. Widening of the mediastinum is again seen. Enlarged cardiac silhouette. Basilar atelectasis or scar. No pneumothorax. Film is underinflated. No edema or effusion. Degenerative changes of the spine IMPRESSION: Underinflation.  Basilar atelectasis or scar. Widened mediastinum. Similar previous. Please correlate with prior PET-CT 12/27/2022 demonstrating prominent mediastinal fat. Chest port Electronically Signed   By: Karen Kays M.D.   On: 04/10/2023 19:17   EEG adult Result Date: 04/10/2023 Charlsie Quest, MD     04/11/2023  8:38 AM Patient Name: Tyler [name] MRN: [id number] Epilepsy Attending: Charlsie Quest Referring Physician/Provider: Johney Maine, MD Date: 04/10/2023 Duration: 22.17 mins Patient history: 73yo M patient with rt parietal CNS lymphoma with intermittent left sided painful burning paresthesias and weakness. EEG to evaluate for seizure Level of alertness: Awake, asleep AEDs during EEG study: None Technical aspects: This  EEG study was done with scalp electrodes positioned according to the 10-20 International system of electrode placement. Electrical activity was reviewed with band pass filter of 1-70Hz , sensitivity of 7 uV/mm, display speed of 44mm/sec with a 60Hz  notched filter applied as appropriate. EEG data were recorded continuously and digitally stored.  Video monitoring was available and reviewed as appropriate. Description: The posterior dominant rhythm consists of 8-9 Hz activity of moderate voltage (25-35 uV) seen predominantly in posterior head regions, symmetric and reactive to eye opening and eye closing. Sleep was characterized by vertex waves, sleep spindles (12 to 14 Hz), maximal frontocentral region. EEG showed continuous sharply contoured 3 to 6 Hz theta-delta slowing in right parietal region consistent with breach artifact. Sharp waves were noted in right parietal region. Hyperventilation and photic stimulation were not performed.   ABNORMALITY - Sharp wave, right parietal region - Continuous slow,  right parietal region IMPRESSION: This study showed evidence of epileptogenicity arising from right parietal region. Additionally there is cortical dysfunction arising from right parietal region consistent with underlying craniotomy. No seizures were seen throughout the recording. Charlsie Quest   MR Brain W and Wo Contrast Result Date: 04/07/2023 CLINICAL DATA:  Provided history: Neuro deficit, persistent/recurrent, CNS neoplasm suspected. Diffuse large B-cell lymphoma. Left-sided weakness with midline shift on CT. EXAM: MRI HEAD WITHOUT AND WITH CONTRAST TECHNIQUE: Multiplanar, multiecho pulse sequences of the brain and surrounding structures were obtained without and with intravenous contrast. CONTRAST:  8mL GADAVIST GADOBUTROL 1 MMOL/ML IV SOLN COMPARISON:  Head CT 04/07/2023.  Brain MRI 03/07/2023 neck FINDINGS: Intermittently motion degraded examination. Most notably, the axial T1 post-contrast sequence and  coronal T2 sequence are moderately motion degraded. Within this limitation findings are as follows. Brain: Since the prior MRI 06/05/2023, there has been right parietal craniotomy for biopsy/debulking of an enhancing right parietal lobe tumor. Non-acute blood products at the operative site. The residual enhancing tumor measures 5.4 x 3.6 x 4.4 cm (previously 4.4 x 3.0 x 3.3 when remeasured on prior). Prominent surrounding edema slightly increased. 6 mm leftward midline shift has also increased (previously 4 mm). Persistent effacement of portions of the right lateral ventricle. Persistent asymmetric prominence of the right lateral ventricle temporal horn consistent with ventricular entrapment. Trace postoperative subdural collection along the anterior right cerebral hemisphere. Mild-to-moderate multifocal T2 FLAIR hyperintense signal abnormality elsewhere within the cerebral white matter, nonspecific but compatible with chronic small vessel ischemic disease. There is no acute infarct. Vascular: Maintained flow voids within the proximal large arterial vessels. Skull and upper cervical spine: Right parietal cranioplasty. No focal worrisome marrow lesion. Sinuses/orbits: No orbital mass or acute orbital finding.Mild mucosal thickening within the left maxillary sinus. IMPRESSION: 1. Motion degraded examination. 2. Since the prior brain MRI 03/07/2023, there has been right parietal craniotomy for biopsy/debulking of an enhancing right parietal lobe tumor. The residual tumor has increased in size, now measuring 5.4 x 3.6 x 4.4 cm (previously 4.4 x 3.0 x 3.3 cm). Prominent surrounding edema has slightly increased. 6 mm leftward midline shift has also increased. Unchanged asymmetric prominence of the right lateral ventricle temporal horn consistent with ventricular entrapment. Electronically Signed   By: Jackey Loge D.O.   On: 04/07/2023 14:01   CT Head Wo Contrast Result Date: 04/07/2023 CLINICAL DATA:  Facial  paralysis/weakness (CN 7) EXAM: CT HEAD WITHOUT CONTRAST TECHNIQUE: Contiguous axial images were obtained from the base of the skull through the vertex without intravenous contrast. RADIATION DOSE REDUCTION: This exam was performed according to the departmental dose-optimization program which includes automated exposure control, adjustment of the mA and/or kV according to patient size and/or use of iterative reconstruction technique. COMPARISON:  Head CT 03/06/2023, brain MR 03/07/2023 FINDINGS: Brain: No hemorrhage. No hydrocephalus. No extra-axial fluid collection postsurgical changes from a right parietal craniotomy with interval increase in size of the residual/recurrent tumor at the craniotomy site, now measuring up to 4.7 x 2.7 cm, previously 3.2 x 2.5 cm, when measured in a similar orientation. There is increased leftward midline shift, now measuring up to 6 mm, previously 3 mm. Unchanged disproportionate enlargement of the right temporal horn, worrisome for ventricular entrapment. Vascular: No hyperdense vessel or unexpected calcification. Skull: Normal. Negative for fracture or focal lesion. Status post parietal craniotomy Sinuses/Orbits: No middle ear or mastoid effusion. Mucosal thickening in the bilateral maxillary sinuses, right-greater-than-left orbits are unremarkable. Other: None. IMPRESSION: 1. Postsurgical changes from a right parietal craniotomy with interval increase in size of the residual/recurrent tumor at the craniotomy site, now measuring up to 4.7 x 2.7 cm, previously 3.2 x 2.5 cm. There is increased leftward midline shift, now measuring up to 6 mm, previously 3 mm. Recommend further evaluation with brain MRI. 2. Unchanged asymmetric enlargement of the right temporal horn, compatible with ventricular entrapment Electronically Signed   By: Lorenza Cambridge M.D.   On: 04/07/2023 10:57    ADDENDUM  .Patient was Personally and independently interviewed, examined and relevant elements of the  history of present illness were reviewed in details and an assessment and plan was created. All elements of the patient's history of present illness , assessment and plan were discussed in details with Misty Stanley  Eytan Carrigan NP. The above documentation reflects our combined findings assessment and plan.   Patient is good spirits.  Still having some intermittent left-sided sensory seizures characterized by left facial and left upper and lower extremity paresthesias which last for less than 30 seconds but are still bothersome. No issues with tolerating IV methotrexate today which was given after urine pH was noted to be 7. Continues to be on bicarb drip. Hypokalemia noted and is being replaced this is likely due to his high-dose steroids and bicarbonate drip. Will continue to monitor daily labs and urine pH till methotrexate levels are less than 0.1. Continue IV leucovorin rescue. Will repeat MRI brain for early response assessment since he is still having a left-sided symptomatology. No other acute new focal neurological deficits. No uncontrolled nausea or vomiting.  No concerns with mucositis at this point.  Wyvonnia Lora MD MS

## 2023-04-23 NOTE — H&P (Signed)
HEMATOLOGY/ONCOLOGY CONSULTATION NOTE  Date of Service: 04/23/2023  Patient Care Team: Nechama Guard, FNP as PCP - General (Family Medicine) O'Neal, Ronnald Ramp, MD as PCP - Cardiology (Cardiology) Johney Maine, MD as Consulting Physician (Hematology)  CHIEF COMPLAINTS/PURPOSE OF CONSULTATION:  CNS relapse of large B-cell lymphoma for second cycle of high-dose methotrexate with leucovorin rescue  HISTORY OF PRESENTING ILLNESS:   Aristides Luckey is a wonderful 73 y.o. male with a history of paroxysmal atrial fibrillation not on anticoagulation due to bleeding risk, prostate cancer status post prostatectomy in 2021 complicated by postoperative PE, history of hepatitis B exposure with core antibody positivity on Vemlidy for suppression during chemotherapy with a history of high-grade large B-cell lymphoma completed 6 cycles of R-CHOP with systemic remission. He unfortunately relapsed with a single parenchymal lesion in the brain for which she has completed his first cycle of high-dose methotrexate with leucovorin rescue.  He tolerated his first treatment except for some elevation in liver enzymes which normalized.  No significant cytopenias.  No significant pulmonary or renal toxicity. He did have partial sensory seizures on the left side of the body which have improved on Keppra and with treatment. Currently having minimal symptoms in the left leg that last a few seconds. No concerns with altered mental status or new focal neurological deficits since his last clinic visit. Notes no significant weakness or left-sided hemineglect. Good p.o. intake. No other acute new concerns. No fevers no chills no night sweats. No concerns with oral ulcers. He is here for his second cycle of high-dose methotrexate with leucovorin rescue.  MEDICAL HISTORY:  Past Medical History:  Diagnosis Date   Cancer [facility])    prostate   Follicular lymphoma (HCC) 2024   History of pulmonary embolism     Hypertension    Myelodysplastic syndrome (HCC)    PAF (paroxysmal atrial fibrillation) (HCC)    Recurrent UTI     SURGICAL HISTORY: Past Surgical History:  Procedure Laterality Date   APPLICATION OF CRANIAL NAVIGATION Right 03/10/2023   Procedure: APPLICATION OF CRANIAL NAVIGATION;  Surgeon: Tressie Stalker, MD;  Location[facility] OR;  Service: Neurosurgery;  Laterality: Right;   BIOPSY  08/21/2022   Procedure: BIOPSY;  Surgeon: Beverley Fiedler, MD;  Location: [facility] ENDOSCOPY;  Service: Gastroenterology;;   CRANIOTOMY Right 03/10/2023   Procedure: PARIETAL CRANIOTOMY;  Surgeon: Tressie Stalker, MD;  Location: [facility] OR;  Service: Neurosurgery;  Laterality: Right;   ESOPHAGOGASTRODUODENOSCOPY (EGD) WITH PROPOFOL N/A 08/21/2022   Procedure: ESOPHAGOGASTRODUODENOSCOPY (EGD) WITH PROPOFOL;  Surgeon: Beverley Fiedler, MD;  Location: [facility] ENDOSCOPY;  Service: Gastroenterology;  Laterality: N/A;   IR IMAGING GUIDED PORT INSERTION  08/26/2022   LYMPHADENECTOMY Bilateral 09/01/2019   Procedure: LYMPHADENECTOMY;  Surgeon: Sebastian Ache, MD;  Location: [facility] ORS;  Service: Urology;  Laterality: Bilateral;   ROBOT ASSISTED LAPAROSCOPIC RADICAL PROSTATECTOMY N/A 09/01/2019   Procedure: XI ROBOTIC ASSISTED LAPAROSCOPIC RADICAL PROSTATECTOMY;  Surgeon: Sebastian Ache, MD;  Location: [facility] ORS;  Service: Urology;  Laterality: N/A;  3 HRS    SOCIAL HISTORY: Social History   Socioeconomic History   Marital status: Married    Spouse name: Not on file   Number of children: Not on file   Years of education: Not on file   Highest education level: Not on file  Occupational History   Not on file  Tobacco Use   Smoking status: Never   Smokeless tobacco: Never  Vaping Use   Vaping status: Never Used  Substance and Sexual  Activity   Alcohol use: Yes    Comment: at least one 40 oz beer daily   Drug use: Never   Sexual activity: Not on file  Other Topics Concern   Not on file  Social History Narrative   Not on file    Social Drivers of Health   Financial Resource Strain: Not on File (06/14/2021)   Received from [facility], [place]   Financial Resource Strain    Financial Resource Strain: 0  Food Insecurity: No Food Insecurity (04/22/2023)   Hunger Vital Sign    Worried About Running Out of Food in the Last Year: Never true    Ran Out of Food in the Last Year: Never true  Transportation Needs: No Transportation Needs (04/22/2023)   PRAPARE - Administrator, Civil Service (Medical): No    Lack of Transportation (Non-Medical): No  Physical Activity: Not on File (06/14/2021)   Received from [place], [place]   Physical Activity    Physical Activity: 0  Stress: Not on File (06/14/2021)   Received from [facility], [place]   Stress    Stress: 0  Social Connections: Patient Declined (04/22/2023)   Social Connection and Isolation Panel [NHANES]    Frequency of Communication with Friends and Family: Patient declined    Frequency of Social Gatherings with Friends and Family: Patient declined    Attends Religious Services: Patient declined    Database administrator or Organizations: Patient declined    Attends Banker Meetings: Patient declined    Marital Status: Patient declined  Recent Concern: Social Connections - Moderately Isolated (04/07/2023)   Social Connection and Isolation Panel [NHANES]    Frequency of Communication with Friends and Family: More than three times a week    Frequency of Social Gatherings with Friends and Family: Once a week    Attends Religious Services: Never    Database administrator or Organizations: No    Attends Banker Meetings: Never    Marital Status: Married  Catering manager Violence: Not At Risk (04/22/2023)   Humiliation, Afraid, Rape, and Kick questionnaire    Fear of Current or Ex-Partner: No    Emotionally Abused: No    Physically Abused: No    Sexually Abused: No    FAMILY HISTORY: Family History  Problem Relation Age of Onset   Stroke  Father     ALLERGIES:  is allergic to amitriptyline, amlodipine, and neurontin [gabapentin].  MEDICATIONS:  Current Facility-Administered Medications  Medication Dose Route Frequency Provider Last Rate Last Admin   0.9 %  sodium chloride infusion   Intravenous Continuous Johney Maine, MD       acetaminophen (TYLENOL) tablet 650 mg  650 mg Oral Q4H PRN Johney Maine, MD   650 mg at 04/23/23 0130   alum & mag hydroxide-simeth (MAALOX/MYLANTA) 200-200-20 MG/5ML suspension 60 mL  60 mL Oral Q4H PRN Johney Maine, MD       B-complex with vitamin C tablet 1 tablet  1 tablet Oral Daily Johney Maine, MD       cholecalciferol (VITAMIN D3) 25 MCG (1000 UNIT) tablet 2,000 Units  2,000 Units Oral Daily Johney Maine, MD       enoxaparin (LOVENOX) injection 40 mg  40 mg Subcutaneous Q24H Johney Maine, MD   40 mg at 04/22/23 2125   guaiFENesin-dextromethorphan (ROBITUSSIN DM) 100-10 MG/5ML syrup 10 mL  10 mL Oral Q4H PRN Johney Maine, MD  HYDROcodone-acetaminophen (NORCO/VICODIN) 5-325 MG per tablet 1 tablet  1 tablet Oral Q6H PRN Jimmye Norman, NP       hydrocortisone (ANUSOL-HC) 2.5 % rectal cream 1 Application  1 Application Rectal BID PRN Johney Maine, MD       levETIRAcetam (KEPPRA) tablet 500 mg  500 mg Oral BID Johney Maine, MD   500 mg at 04/22/23 2125   lidocaine (LMX) 4 % cream   Topical TID PRN Jimmye Norman, NP       methotrexate (PF) 6.965 g in dextrose 5 % 1,000 mL chemo infusion  3.5 g/m2 (Treatment Plan Recorded) Intravenous Once Johney Maine, MD       metoprolol succinate (TOPROL-XL) 24 hr tablet 25 mg  25 mg Oral Daily Johney Maine, MD       ondansetron (ZOFRAN) 16 mg, dexamethasone (DECADRON) 20 mg in sodium chloride 0.9 % 50 mL IVPB   Intravenous Once Johney Maine, MD       ondansetron [facility]) tablet 4-8 mg  4-8 mg Oral Q8H PRN Johney Maine, MD       Or    ondansetron (ZOFRAN-ODT) disintegrating tablet 4-8 mg  4-8 mg Oral Q8H PRN Johney Maine, MD       Or   ondansetron [facility]) injection 4 mg  4 mg Intravenous Q8H PRN Johney Maine, MD       Or   ondansetron (ZOFRAN) 8 mg in sodium chloride 0.9 % 50 mL IVPB  8 mg Intravenous Q8H PRN Johney Maine, MD       senna-docusate (Senokot-S) tablet 1 tablet  1 tablet Oral QHS PRN Johney Maine, MD       sodium bicarbonate 150 mEq in dextrose 5 % 1,150 mL infusion   Intravenous Continuous Johney Maine, MD 150 mL/hr at 04/23/23 0818 New Bag at 04/23/23 0818   tenofovir alafenamide (VEMLIDY) tablet 25 mg  25 mg Oral Daily Johney Maine, MD        REVIEW OF SYSTEMS:    10 Point review of Systems was done is negative except as noted above.  PHYSICAL EXAMINATION: ECOG PERFORMANCE STATUS: 2 - Symptomatic, <50% confined to bed  . Vitals:   04/22/23 2242 04/23/23 0300  BP: 133/74 (!) 150/86  Pulse: 64 (!) 56  Resp: 14 14  Temp: 98.3 F (36.8 C) 97.9 F (36.6 C)  SpO2: 100% 100%   GENERAL:alert, in no acute distress and comfortable SKIN: no acute rashes, no significant lesions EYES: conjunctiva are pink and non-injected, sclera anicteric OROPHARYNX: MMM, no exudates, no oropharyngeal erythema or ulceration NECK: supple, no JVD LYMPH:  no palpable lymphadenopathy in the cervical, axillary or inguinal regions LUNGS: clear to auscultation b/l with normal respiratory effort HEART: regular rate & rhythm ABDOMEN:  normoactive bowel sounds , non tender, not distended. Extremity: no pedal edema PSYCH: alert & oriented x 3 with fluent speech NEURO: no focal motor/sensory deficits  LABORATORY DATA:  I have reviewed the data as listed  .    Latest Ref Rng & Units 04/23/2023    5:01 AM 04/22/2023    3:46 PM 04/18/2023    1:43 PM  CBC  WBC 4.0 - 10.5 K/uL 5.2  6.1  6.9   Hemoglobin 13.0 - 17.0 g/dL 21.3  08.6  57.8   Hematocrit 39.0 - 52.0 % 38.9  41.5   41.5   Platelets 150 - 400 K/uL 135  130  119     .  Latest Ref Rng & Units 04/23/2023    5:01 AM 04/22/2023    3:46 PM 04/18/2023    1:43 PM  CMP  Glucose 70 - 99 mg/dL 99  161  096   BUN 8 - 23 mg/dL 25  23  23    Creatinine 0.61 - 1.24 mg/dL 0.45  4.09  8.11   Sodium 135 - 145 mmol/L 137  135  134   Potassium 3.5 - 5.1 mmol/L 3.1  3.4  3.7   Chloride 98 - 111 mmol/L 98  103  101   CO2 22 - 32 mmol/L 32  21  26   Calcium 8.9 - 10.3 mg/dL 7.8  8.4  8.6   Total Protein 6.5 - 8.1 g/dL 4.7  5.8  5.9   Total Bilirubin 0.0 - 1.2 mg/dL 0.7  0.9  0.6   Alkaline Phos 38 - 126 U/L 70  80  93   AST 15 - 41 U/L 17  19  19    ALT 0 - 44 U/L 34  45  53      RADIOGRAPHIC STUDIES: I have personally reviewed the radiological images as listed and agreed with the findings in the report. DG CHEST PORT 1 VIEW Result Date: 04/10/2023 CLINICAL DATA:  Shortness of breath.  History of lymphoma EXAM: PORTABLE CHEST 1 VIEW COMPARISON:  X-ray 08/11/2022.  PET-CT 12/27/2022 FINDINGS: Right IJ chest port with tip overlying the right atrium. The port is accessed. Widening of the mediastinum is again seen. Enlarged cardiac silhouette. Basilar atelectasis or scar. No pneumothorax. Film is underinflated. No edema or effusion. Degenerative changes of the spine IMPRESSION: Underinflation.  Basilar atelectasis or scar. Widened mediastinum. Similar previous. Please correlate with prior PET-CT 12/27/2022 demonstrating prominent mediastinal fat. Chest port Electronically Signed   By: Karen Kays M.D.   On: 04/10/2023 19:17   EEG adult Result Date: 04/10/2023 Charlsie Quest, MD     04/11/2023  8:38 AM Patient Name: Tyler [name] MRN: [id number] Epilepsy Attending: Charlsie Quest Referring Physician/Provider: Johney Maine, MD Date: 04/10/2023 Duration: 22.17 mins Patient history: 74yo M patient with rt parietal CNS lymphoma with intermittent left sided painful burning paresthesias and weakness. EEG to evaluate for  seizure Level of alertness: Awake, asleep AEDs during EEG study: None Technical aspects: This EEG study was done with scalp electrodes positioned according to the 10-20 International system of electrode placement. Electrical activity was reviewed with band pass filter of 1-70Hz , sensitivity of 7 uV/mm, display speed of 23mm/sec with a 60Hz  notched filter applied as appropriate. EEG data were recorded continuously and digitally stored.  Video monitoring was available and reviewed as appropriate. Description: The posterior dominant rhythm consists of 8-9 Hz activity of moderate voltage (25-35 uV) seen predominantly in posterior head regions, symmetric and reactive to eye opening and eye closing. Sleep was characterized by vertex waves, sleep spindles (12 to 14 Hz), maximal frontocentral region. EEG showed continuous sharply contoured 3 to 6 Hz theta-delta slowing in right parietal region consistent with breach artifact. Sharp waves were noted in right parietal region. Hyperventilation and photic stimulation were not performed.   ABNORMALITY - Sharp wave, right parietal region - Continuous slow,  right parietal region IMPRESSION: This study showed evidence of epileptogenicity arising from right parietal region. Additionally there is cortical dysfunction arising from right parietal region consistent with underlying craniotomy. No seizures were seen throughout the recording. Charlsie Quest   MR Brain W and Wo Contrast Result Date: 04/07/2023 CLINICAL DATA:  Provided history: Neuro deficit, persistent/recurrent, CNS neoplasm suspected. Diffuse large B-cell lymphoma. Left-sided weakness with midline shift on CT. EXAM: MRI HEAD WITHOUT AND WITH CONTRAST TECHNIQUE: Multiplanar, multiecho pulse sequences of the brain and surrounding structures were obtained without and with intravenous contrast. CONTRAST:  8mL GADAVIST GADOBUTROL 1 MMOL/ML IV SOLN COMPARISON:  Head CT 04/07/2023.  Brain MRI 03/07/2023 neck FINDINGS:  Intermittently motion degraded examination. Most notably, the axial T1 post-contrast sequence and coronal T2 sequence are moderately motion degraded. Within this limitation findings are as follows. Brain: Since the prior MRI 06/05/2023, there has been right parietal craniotomy for biopsy/debulking of an enhancing right parietal lobe tumor. Non-acute blood products at the operative site. The residual enhancing tumor measures 5.4 x 3.6 x 4.4 cm (previously 4.4 x 3.0 x 3.3 when remeasured on prior). Prominent surrounding edema slightly increased. 6 mm leftward midline shift has also increased (previously 4 mm). Persistent effacement of portions of the right lateral ventricle. Persistent asymmetric prominence of the right lateral ventricle temporal horn consistent with ventricular entrapment. Trace postoperative subdural collection along the anterior right cerebral hemisphere. Mild-to-moderate multifocal T2 FLAIR hyperintense signal abnormality elsewhere within the cerebral white matter, nonspecific but compatible with chronic small vessel ischemic disease. There is no acute infarct. Vascular: Maintained flow voids within the proximal large arterial vessels. Skull and upper cervical spine: Right parietal cranioplasty. No focal worrisome marrow lesion. Sinuses/orbits: No orbital mass or acute orbital finding.Mild mucosal thickening within the left maxillary sinus. IMPRESSION: 1. Motion degraded examination. 2. Since the prior brain MRI 03/07/2023, there has been right parietal craniotomy for biopsy/debulking of an enhancing right parietal lobe tumor. The residual tumor has increased in size, now measuring 5.4 x 3.6 x 4.4 cm (previously 4.4 x 3.0 x 3.3 cm). Prominent surrounding edema has slightly increased. 6 mm leftward midline shift has also increased. Unchanged asymmetric prominence of the right lateral ventricle temporal horn consistent with ventricular entrapment. Electronically Signed   By: Jackey Loge D.O.   On:  04/07/2023 14:01   CT Head Wo Contrast Result Date: 04/07/2023 CLINICAL DATA:  Facial paralysis/weakness (CN 7) EXAM: CT HEAD WITHOUT CONTRAST TECHNIQUE: Contiguous axial images were obtained from the base of the skull through the vertex without intravenous contrast. RADIATION DOSE REDUCTION: This exam was performed according to the departmental dose-optimization program which includes automated exposure control, adjustment of the mA and/or kV according to patient size and/or use of iterative reconstruction technique. COMPARISON:  Head CT 03/06/2023, brain MR 03/07/2023 FINDINGS: Brain: No hemorrhage. No hydrocephalus. No extra-axial fluid collection postsurgical changes from a right parietal craniotomy with interval increase in size of the residual/recurrent tumor at the craniotomy site, now measuring up to 4.7 x 2.7 cm, previously 3.2 x 2.5 cm, when measured in a similar orientation. There is increased leftward midline shift, now measuring up to 6 mm, previously 3 mm. Unchanged disproportionate enlargement of the right temporal horn, worrisome for ventricular entrapment. Vascular: No hyperdense vessel or unexpected calcification. Skull: Normal. Negative for fracture or focal lesion. Status post parietal craniotomy Sinuses/Orbits: No middle ear or mastoid effusion. Mucosal thickening in the bilateral maxillary sinuses, right-greater-than-left orbits are unremarkable. Other: None. IMPRESSION: 1. Postsurgical changes from a right parietal craniotomy with interval increase in size of the residual/recurrent tumor at the craniotomy site, now measuring up to 4.7 x 2.7 cm, previously 3.2 x 2.5 cm. There is increased leftward midline shift, now measuring up to 6 mm, previously 3 mm. Recommend further evaluation with brain MRI. 2. Unchanged  asymmetric enlargement of the right temporal horn, compatible with ventricular entrapment Electronically Signed   By: Lorenza Cambridge M.D.   On: 04/07/2023 10:57    ASSESSMENT &  PLAN:   73 year old gentleman admitted for second cycle of high-dose chemotherapy and leucovorin rescue for CNS relapse of large B-cell lymphoma  1.  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  Also s/p craniotomy with gross total resection 03/10/23.  - CT of head 04/07/2023 showed interval increase in size of tumor at the craniotomy site.  MRI of head 04/07/2023 increase in size of tumor with slightly increased surrounding edema. - Currently on Hi-dose methotrexate which is more aggressive approach.  Received cycle one 04/09/23, now admitted for cycle 2.   - Sodium bicarb IV drip has been started -labs ordered today -Will hope to start IV methotrexate tomorrow if urine pH is greater than or equal to 7 -Will get daily methotrexate levels till levels are less than 0.1 -Will need leucovorin rescue IV till methotrexate levels are less than 0.1. -Anticipate 3 to 5 days of hospitalization. -We will set up patient for outpatient Rituxan between second and third cycles of methotrexate and subsequently between treatment cycles. -- Daily labs   2.  Partial sensory seizures in the left side of the body - C/o L-sided pain, tingling with burning sensation  -- likely be due to partial sensory seizures. Was seen by Neuro, s/p EEG last admission. -- These are significantly improved -Will continue Keppra 5 mg p.o. twice daily -Should hopefully continue to improve and resolve with treatment of his CNS lymphoma   3.  History of Altered mental status -Improved to baseline - Likely due to CNS lymphoma with edema - Monitor closely    4. Thrombocytopenia - Mild, likely due to recent chemotherapy treatment - platelets 135k today - No intervention required at this time   5.  Elevated blood glucose  - Monitor blood sugar levels closely. -Primary team following    6.  History of MDS - Patient with  history of low-grade MDS - His blood counts have not been limiting   7.  A-fib - S/p Eliquis, d/c'd due to high risk for bleeding  - Follow closely with cardiology   8.  Prostate cancer - Diagnosed 2021   9.  Hep B antibody positive, chronic - On tenofovir for hep B reactivation suppression in context of Rituxan use.  Plan is to continue for 6 months after his last chemotherapy. - Follow ID outpatient  10. VTE prophylaxis with Lovenox.   .The total time spent in the appointment was 80 minutes* .  All of the patient's questions were answered with apparent satisfaction. The patient knows to call the clinic with any problems, questions or concerns.   Wyvonnia Lora MD MS AAHIVMS [facility] Hematology/Oncology Physician [facility]  .*Total Encounter Time as defined by the Centers for Medicare and Medicaid Services includes, in addition to the face-to-face time of a patient visit (documented in the note above) non-face-to-face time: obtaining and reviewing outside history, ordering and reviewing medications, tests or procedures, care coordination (communications with other health care professionals or caregivers) and documentation in the medical record.    Wyvonnia Lora MD MS AAHIVMS [facility] Hematology/Oncology Physician [facility]  (Office):       [phone] (Work cell):  [phone] (Fax):  [phone]       

## 2023-04-24 ENCOUNTER — Encounter: Payer: Self-pay | Admitting: Hematology

## 2023-04-24 ENCOUNTER — Inpatient Hospital Stay (HOSPITAL_COMMUNITY): Payer: 59

## 2023-04-24 ENCOUNTER — Other Ambulatory Visit: Payer: Self-pay

## 2023-04-24 DIAGNOSIS — R569 Unspecified convulsions: Secondary | ICD-10-CM | POA: Diagnosis not present

## 2023-04-24 DIAGNOSIS — Z5111 Encounter for antineoplastic chemotherapy: Secondary | ICD-10-CM | POA: Diagnosis not present

## 2023-04-24 DIAGNOSIS — C8339 Primary central nervous system lymphoma: Secondary | ICD-10-CM | POA: Diagnosis not present

## 2023-04-24 LAB — CBC WITH DIFFERENTIAL/PLATELET
Abs Immature Granulocytes: 0.09 10*3/uL — ABNORMAL HIGH (ref 0.00–0.07)
Basophils Absolute: 0 10*3/uL (ref 0.0–0.1)
Basophils Relative: 0 %
Eosinophils Absolute: 0 10*3/uL (ref 0.0–0.5)
Eosinophils Relative: 0 %
HCT: 39.8 % (ref 39.0–52.0)
Hemoglobin: 13.4 g/dL (ref 13.0–17.0)
Immature Granulocytes: 1 %
Lymphocytes Relative: 9 %
Lymphs Abs: 0.6 10*3/uL — ABNORMAL LOW (ref 0.7–4.0)
MCH: 30 pg (ref 26.0–34.0)
MCHC: 33.7 g/dL (ref 30.0–36.0)
MCV: 89.2 fL (ref 80.0–100.0)
Monocytes Absolute: 0.4 10*3/uL (ref 0.1–1.0)
Monocytes Relative: 6 %
Neutro Abs: 5.5 10*3/uL (ref 1.7–7.7)
Neutrophils Relative %: 84 %
Platelets: 148 10*3/uL — ABNORMAL LOW (ref 150–400)
RBC: 4.46 MIL/uL (ref 4.22–5.81)
RDW: 12.7 % (ref 11.5–15.5)
WBC: 6.6 10*3/uL (ref 4.0–10.5)
nRBC: 0 % (ref 0.0–0.2)

## 2023-04-24 LAB — URINALYSIS, DIPSTICK ONLY
Bilirubin Urine: NEGATIVE
Glucose, UA: NEGATIVE mg/dL
Hgb urine dipstick: NEGATIVE
Ketones, ur: NEGATIVE mg/dL
Leukocytes,Ua: NEGATIVE
Nitrite: NEGATIVE
Protein, ur: NEGATIVE mg/dL
Specific Gravity, Urine: 1.014 (ref 1.005–1.030)
pH: 8 (ref 5.0–8.0)

## 2023-04-24 LAB — COMPREHENSIVE METABOLIC PANEL
ALT: 54 U/L — ABNORMAL HIGH (ref 0–44)
AST: 26 U/L (ref 15–41)
Albumin: 2.9 g/dL — ABNORMAL LOW (ref 3.5–5.0)
Alkaline Phosphatase: 75 U/L (ref 38–126)
Anion gap: 8 (ref 5–15)
BUN: 24 mg/dL — ABNORMAL HIGH (ref 8–23)
CO2: 31 mmol/L (ref 22–32)
Calcium: 7.8 mg/dL — ABNORMAL LOW (ref 8.9–10.3)
Chloride: 97 mmol/L — ABNORMAL LOW (ref 98–111)
Creatinine, Ser: 0.82 mg/dL (ref 0.61–1.24)
GFR, Estimated: >60 mL/min (ref >60)
Glucose, Bld: 88 mg/dL (ref 70–99)
Potassium: 3.6 mmol/L (ref 3.5–5.1)
Sodium: 136 mmol/L (ref 135–145)
Total Bilirubin: 0.8 mg/dL (ref 0.0–1.2)
Total Protein: 5 g/dL — ABNORMAL LOW (ref 6.5–8.1)

## 2023-04-24 LAB — METHOTREXATE: Methotrexate: 11.13

## 2023-04-24 MED ORDER — GADOBUTROL 1 MMOL/ML IV SOLN
8.0000 mL | Freq: Once | INTRAVENOUS | Status: AC | PRN
  Administered 2023-04-24: 8 mL via INTRAVENOUS

## 2023-04-24 MED ORDER — LEVETIRACETAM 500 MG PO TABS
750.0000 mg | ORAL_TABLET | Freq: Two times a day (BID) | ORAL | Status: DC
  Administered 2023-04-24 – 2023-04-26 (×4): 750 mg via ORAL
  Filled 2023-04-24 (×5): qty 1

## 2023-04-24 MED ORDER — LEUCOVORIN CALCIUM INJECTION 100 MG
15.0000 mg | Freq: Four times a day (QID) | INTRAMUSCULAR | Status: AC
  Administered 2023-04-24 – 2023-04-25 (×4): 16 mg via INTRAVENOUS
  Filled 2023-04-24 (×7): qty 0.8

## 2023-04-24 NOTE — Progress Notes (Addendum)
 Tyler [name]   DOB:[birth year]   WG#:[id number]      ASSESSMENT & PLAN:  1.  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  S/p craniotomy with gross total resection 03/10/23.  - CT of head 04/07/2023 showed interval increase in size of tumor at the craniotomy site.  MRI of head 04/07/2023 increase in size of tumor with slightly increased surrounding edema. - Currently on Hi-dose methotrexate chemotherapy regimen which is more aggressive approach.  Received cycle 1 on 04/09/23. - Admitted for cycle 2 of chemotherapy regimen,  completed 04/23/2023. - IV sodium bicarb ongoing.   - Continue to monitor urine pH daily.  Urine pH 8.0 today. - Continue monitoring methotrexate level daily until <0.1. On 04/23/23 level was <0.05. Today's level PENDING. - Continue IV leucovorin rescue until methotrexate level <0.1 - Will plan for IV Rituxan as outpatient between second and third cycle of of methotrexate and subsequently between treatment cycles. - Continue daily labs - Medical oncology/Dr. Candise Che following closely.     2.  Partial sensory seizures, left side -Complaining of left-sided paresthesia which he states causes his fingers to feel numb for a few seconds.   - May be due to partial sensory seizures. Was seen by Neuro, s/p EEG last admission, no seizures seen. - Currently on Keppra 750 mg po bid.  He was started on 500 mg during last admission with siginificant improvement in symptoms.  Should continue to improve with treatment for CNS lymphoma. -Will consider repeat MRI brain due to ongoing symptoms - Continue to monitor closely   3.  History of Altered mental status - Improved to baseline. - Likely due to CNS lymphoma with edema - Monitor closely    4. Thrombocytopenia - Mild, likely due to recent chemotherapy treatment - platelets improving 148k today - No intervention required at this time    5.  Elevated blood glucose  - Monitor blood sugar levels closely. -Primary team following    6.  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  A-fib - S/p Eliquis, d/c'd due to high risk for bleeding  - Follow closely with cardiology   8.  Prostate cancer - Diagnosed 2021   9.  Hep B antibody positive, chronic - On tenofovir for hep B reactivation suppression in context of Rituxan use.  Plan is to continue for 6 months after his last chemotherapy. - Follow ID outpatient   10.  VTE prophylaxis - With Lovenox  11. Hypokalemia - likely due to Hi-dose steroids + bicarb - continue electrolyte replacement as ordered - monitor CMP      Code Status Full  Subjective:  Patient seen awake and alert ambulating in his room and hallway very well.  Reports he still gets a left-sided paresthesia which causes his fingers on his left hand to feel numb for a few seconds.  Otherwise states he feels good.  No acute distress is noted.  Objective:  Vitals:   04/23/23 2217 04/24/23 0532  BP: (!) 160/99 (!) 157/99  Pulse: (!) 55 63  Resp: 14 14  Temp: (!) 97.5 F (36.4 C) 97.6 F (36.4 C)  SpO2: 100% 97%     Intake/Output Summary (Last 24 hours) at 04/24/2023 0945 Last data filed at 04/24/2023 0536 Gross per 24 hour  Intake 240 ml  Output 975 ml  Net -735 ml  REVIEW OF SYSTEMS:   Constitutional: +Left-sided paresthesias Eyes: Denies blurriness of vision, double vision or watery eyes Ears, nose, mouth, throat, and face: Denies mucositis or sore throat Respiratory: Denies cough, dyspnea or wheezes Cardiovascular: Denies palpitation, chest discomfort or lower extremity swelling Gastrointestinal:  Denies nausea, heartburn or change in bowel habits Skin: Denies abnormal skin rashes Lymphatics: Denies new lymphadenopathy or easy bruising Neurological: Denies numbness, tingling or new weaknesses Behavioral/Psych: Mood is stable,  no new changes  All other systems were reviewed with the patient and are negative.  PHYSICAL EXAMINATION: ECOG PERFORMANCE STATUS: 2 - Symptomatic, <50% confined to bed  Vitals:   04/23/23 2217 04/24/23 0532  BP: (!) 160/99 (!) 157/99  Pulse: (!) 55 63  Resp: 14 14  Temp: (!) 97.5 F (36.4 C) 97.6 F (36.4 C)  SpO2: 100% 97%   Filed Weights   04/23/23 1802  Weight: 187 lb (84.8 kg)    GENERAL: alert, no distress and comfortable SKIN: skin color, texture, turgor are normal, no rashes or significant lesions EYES: normal, conjunctiva are pink and non-injected, sclera clear OROPHARYNX: no exudate, no erythema and lips, buccal mucosa, and tongue normal  NECK: supple, thyroid normal size, non-tender, without nodularity LYMPH: no palpable lymphadenopathy in the cervical, axillary or inguinal LUNGS: clear to auscultation and percussion with normal breathing effort HEART: regular rate & rhythm and no murmurs and no lower extremity edema ABDOMEN: abdomen soft, non-tender and normal bowel sounds MUSCULOSKELETAL: no cyanosis of digits and no clubbing  PSYCH: alert & oriented x 3 with fluent speech NEURO: no focal motor/sensory deficits   All questions were answered. The patient knows to call the clinic with any problems, questions or concerns.   The total time spent in the appointment was 40 minutes encounter with patient including review of chart and various tests results, discussions about plan of care and coordination of care plan  Dawson Bills, NP 04/24/2023 9:45 AM    Labs Reviewed:  Lab Results  Component Value Date   WBC 6.6 04/24/2023   HGB 13.4 04/24/2023   HCT 39.8 04/24/2023   MCV 89.2 04/24/2023   PLT 148 (L) 04/24/2023   Recent Labs    08/11/22 0316 08/11/22 1544 08/12/22 0515 08/12/22 1400 08/20/22 0302 08/21/22 0334 04/22/23 1546 04/23/23 0501 04/24/23 0611  NA 134*   < > 137   < > 137   < > 135 137 136  K 3.7   < > 3.3*   < > 4.0   < > 3.4* 3.1* 3.6   CL 99   < > 101   < > 102   < > 103 98 97*  CO2 25   < > 28   < > 24   < > 21* 32 31  GLUCOSE 185*   < > 114*   < > 78   < > 130* 99 88  BUN 52*   < > 41*   < > 45*   < > 23 25* 24*  CREATININE 1.58*   < > 1.44*   < > 3.38*   < > 0.90 0.89 0.82  CALCIUM 7.4*   < > 7.5*   < > 7.2*   < > 8.4* 7.8* 7.8*  GFRNONAA 47*   < > 52*   < > 19*   < > >60 >60 >60  PROT 4.6*  --  4.2*  --  3.9*   < > 5.8* 4.7* 5.0*  ALBUMIN 1.8*  1.7*   < >  1.7*  1.6*   < > 1.7*   < > 3.3* 2.7* 2.9*  AST 33  --  31  --  25   < > 19 17 26   ALT 31  --  31  --  18   < > 45* 34 54*  ALKPHOS 84  --  79  --  111   < > 80 70 75  BILITOT 3.2*  --  2.5*  --  0.8   < > 0.9 0.7 0.8  BILIDIR 1.7*  --  1.3*  --  0.2  --   --   --   --   IBILI 1.5*  --  1.2*  --  0.6  --   --   --   --    < > = values in this interval not displayed.    Studies Reviewed:  DG CHEST PORT 1 VIEW Result Date: 04/10/2023 CLINICAL DATA:  Shortness of breath.  History of lymphoma EXAM: PORTABLE CHEST 1 VIEW COMPARISON:  X-ray 08/11/2022.  PET-CT 12/27/2022 FINDINGS: Right IJ chest port with tip overlying the right atrium. The port is accessed. Widening of the mediastinum is again seen. Enlarged cardiac silhouette. Basilar atelectasis or scar. No pneumothorax. Film is underinflated. No edema or effusion. Degenerative changes of the spine IMPRESSION: Underinflation.  Basilar atelectasis or scar. Widened mediastinum. Similar previous. Please correlate with prior PET-CT 12/27/2022 demonstrating prominent mediastinal fat. Chest port Electronically Signed   By: Karen Kays M.D.   On: 04/10/2023 19:17   EEG adult Result Date: 04/10/2023 Charlsie Quest, MD     04/11/2023  8:38 AM Patient Name: Chay Mazzoni MRN: [id number] Epilepsy Attending: Charlsie Quest Referring Physician/Provider: Johney Maine, MD Date: 04/10/2023 Duration: 22.17 mins Patient history: 73yo M patient with rt parietal CNS lymphoma with intermittent left sided painful burning  paresthesias and weakness. EEG to evaluate for seizure Level of alertness: Awake, asleep AEDs during EEG study: None Technical aspects: This EEG study was done with scalp electrodes positioned according to the 10-20 International system of electrode placement. Electrical activity was reviewed with band pass filter of 1-70Hz , sensitivity of 7 uV/mm, display speed of 73mm/sec with a 60Hz  notched filter applied as appropriate. EEG data were recorded continuously and digitally stored.  Video monitoring was available and reviewed as appropriate. Description: The posterior dominant rhythm consists of 8-9 Hz activity of moderate voltage (25-35 uV) seen predominantly in posterior head regions, symmetric and reactive to eye opening and eye closing. Sleep was characterized by vertex waves, sleep spindles (12 to 14 Hz), maximal frontocentral region. EEG showed continuous sharply contoured 3 to 6 Hz theta-delta slowing in right parietal region consistent with breach artifact. Sharp waves were noted in right parietal region. Hyperventilation and photic stimulation were not performed.   ABNORMALITY - Sharp wave, right parietal region - Continuous slow,  right parietal region IMPRESSION: This study showed evidence of epileptogenicity arising from right parietal region. Additionally there is cortical dysfunction arising from right parietal region consistent with underlying craniotomy. No seizures were seen throughout the recording. Charlsie Quest   MR Brain W and Wo Contrast Result Date: 04/07/2023 CLINICAL DATA:  Provided history: Neuro deficit, persistent/recurrent, CNS neoplasm suspected. Diffuse large B-cell lymphoma. Left-sided weakness with midline shift on CT. EXAM: MRI HEAD WITHOUT AND WITH CONTRAST TECHNIQUE: Multiplanar, multiecho pulse sequences of the brain and surrounding structures were obtained without and with intravenous contrast. CONTRAST:  8mL GADAVIST GADOBUTROL 1 MMOL/ML IV SOLN COMPARISON:  Head  CT  04/07/2023.  Brain MRI 03/07/2023 neck FINDINGS: Intermittently motion degraded examination. Most notably, the axial T1 post-contrast sequence and coronal T2 sequence are moderately motion degraded. Within this limitation findings are as follows. Brain: Since the prior MRI 06/05/2023, there has been right parietal craniotomy for biopsy/debulking of an enhancing right parietal lobe tumor. Non-acute blood products at the operative site. The residual enhancing tumor measures 5.4 x 3.6 x 4.4 cm (previously 4.4 x 3.0 x 3.3 when remeasured on prior). Prominent surrounding edema slightly increased. 6 mm leftward midline shift has also increased (previously 4 mm). Persistent effacement of portions of the right lateral ventricle. Persistent asymmetric prominence of the right lateral ventricle temporal horn consistent with ventricular entrapment. Trace postoperative subdural collection along the anterior right cerebral hemisphere. Mild-to-moderate multifocal T2 FLAIR hyperintense signal abnormality elsewhere within the cerebral white matter, nonspecific but compatible with chronic small vessel ischemic disease. There is no acute infarct. Vascular: Maintained flow voids within the proximal large arterial vessels. Skull and upper cervical spine: Right parietal cranioplasty. No focal worrisome marrow lesion. Sinuses/orbits: No orbital mass or acute orbital finding.Mild mucosal thickening within the left maxillary sinus. IMPRESSION: 1. Motion degraded examination. 2. Since the prior brain MRI 03/07/2023, there has been right parietal craniotomy for biopsy/debulking of an enhancing right parietal lobe tumor. The residual tumor has increased in size, now measuring 5.4 x 3.6 x 4.4 cm (previously 4.4 x 3.0 x 3.3 cm). Prominent surrounding edema has slightly increased. 6 mm leftward midline shift has also increased. Unchanged asymmetric prominence of the right lateral ventricle temporal horn consistent with ventricular entrapment.  Electronically Signed   By: Jackey Loge D.O.   On: 04/07/2023 14:01   CT Head Wo Contrast Result Date: 04/07/2023 CLINICAL DATA:  Facial paralysis/weakness (CN 7) EXAM: CT HEAD WITHOUT CONTRAST TECHNIQUE: Contiguous axial images were obtained from the base of the skull through the vertex without intravenous contrast. RADIATION DOSE REDUCTION: This exam was performed according to the departmental dose-optimization program which includes automated exposure control, adjustment of the mA and/or kV according to patient size and/or use of iterative reconstruction technique. COMPARISON:  Head CT 03/06/2023, brain MR 03/07/2023 FINDINGS: Brain: No hemorrhage. No hydrocephalus. No extra-axial fluid collection postsurgical changes from a right parietal craniotomy with interval increase in size of the residual/recurrent tumor at the craniotomy site, now measuring up to 4.7 x 2.7 cm, previously 3.2 x 2.5 cm, when measured in a similar orientation. There is increased leftward midline shift, now measuring up to 6 mm, previously 3 mm. Unchanged disproportionate enlargement of the right temporal horn, worrisome for ventricular entrapment. Vascular: No hyperdense vessel or unexpected calcification. Skull: Normal. Negative for fracture or focal lesion. Status post parietal craniotomy Sinuses/Orbits: No middle ear or mastoid effusion. Mucosal thickening in the bilateral maxillary sinuses, right-greater-than-left orbits are unremarkable. Other: None. IMPRESSION: 1. Postsurgical changes from a right parietal craniotomy with interval increase in size of the residual/recurrent tumor at the craniotomy site, now measuring up to 4.7 x 2.7 cm, previously 3.2 x 2.5 cm. There is increased leftward midline shift, now measuring up to 6 mm, previously 3 mm. Recommend further evaluation with brain MRI. 2. Unchanged asymmetric enlargement of the right temporal horn, compatible with ventricular entrapment Electronically Signed   By: Lorenza Cambridge  M.D.   On: 04/07/2023 10:57     ADDENDUM  .Patient was Personally and independently interviewed, examined and relevant elements of the history of present illness were reviewed in details and an assessment and  plan was created. All elements of the patient's history of present illness , assessment and plan were discussed in details with Hinton Dyer NP. The above documentation reflects our combined findings assessment and plan.  -Patient with no nausea vomiting or other acute side effects from high-dose methotrexate. -Urine pH at 8 and methotrexate levels coming down appropriately. -Still with mild short-lived intermittent left-sided sensory seizures. -Dose of Keppra was increased to 750 mg p.o. twice daily since this morning. -Will get repeat MRI brain later today or tomorrow to evaluate for interval early assessment of treatment response  Wyvonnia Lora MD MS

## 2023-04-24 NOTE — Plan of Care (Signed)
   Problem: Education: Goal: Knowledge of General Education information will improve Description: Including pain rating scale, medication(s)/side effects and non-pharmacologic comfort measures Outcome: Progressing   Problem: Health Behavior/Discharge Planning: Goal: Ability to manage health-related needs will improve Outcome: Progressing   Problem: Clinical Measurements: Goal: Ability to maintain clinical measurements within normal limits will improve Outcome: Progressing Goal: Will remain free from infection Outcome: Progressing Goal: Diagnostic test results will improve Outcome: Progressing Goal: Respiratory complications will improve Outcome: Progressing Goal: Cardiovascular complication will be avoided Outcome: Progressing   Problem: Activity: Goal: Risk for activity intolerance will decrease Outcome: Progressing   Problem: Nutrition: Goal: Adequate nutrition will be maintained Outcome: Progressing   Problem: Coping: Goal: Level of anxiety will decrease Outcome: Progressing   Problem: Elimination: Goal: Will not experience complications related to bowel motility Outcome: Progressing Goal: Will not experience complications related to urinary retention Outcome: Progressing   Problem: Pain Managment: Goal: General experience of comfort will improve and/or be controlled Outcome: Progressing   Problem: Safety: Goal: Ability to remain free from injury will improve Outcome: Progressing   Problem: Skin Integrity: Goal: Risk for impaired skin integrity will decrease Outcome: Progressing   Problem: Education: Goal: Knowledge of the prescribed therapeutic regimen will improve Outcome: Progressing   Problem: Activity: Goal: Ability to implement measures to reduce episodes of fatigue will improve Outcome: Progressing   Problem: Bowel/Gastric: Goal: Will not experience complications related to bowel motility Outcome: Progressing   Problem: Coping: Goal: Ability to  identify and develop effective coping behavior will improve Outcome: Progressing   Problem: Nutritional: Goal: Maintenance of adequate nutrition will improve Outcome: Progressing

## 2023-04-24 NOTE — Progress Notes (Signed)
 Mobility Specialist - Progress Note   04/24/23 1119  Mobility  Activity Ambulated independently in hallway  Level of Assistance Independent  Assistive Device None  Distance Ambulated (ft) 500 ft  Activity Response Tolerated well  Mobility Referral Yes  Mobility visit 1 Mobility  Mobility Specialist Start Time (ACUTE ONLY) 1104  Mobility Specialist Stop Time (ACUTE ONLY) 1119  Mobility Specialist Time Calculation (min) (ACUTE ONLY) 15 min   Pt received in bed and agreeable to mobility. No complaints during session. Pt to bathroom after session with all needs met.    [facility]

## 2023-04-25 ENCOUNTER — Inpatient Hospital Stay (HOSPITAL_COMMUNITY): Payer: 59

## 2023-04-25 ENCOUNTER — Other Ambulatory Visit: Payer: Self-pay | Admitting: Hematology

## 2023-04-25 DIAGNOSIS — R221 Localized swelling, mass and lump, neck: Secondary | ICD-10-CM

## 2023-04-25 DIAGNOSIS — R569 Unspecified convulsions: Secondary | ICD-10-CM | POA: Diagnosis not present

## 2023-04-25 DIAGNOSIS — J9601 Acute respiratory failure with hypoxia: Secondary | ICD-10-CM | POA: Diagnosis not present

## 2023-04-25 DIAGNOSIS — C8339 Primary central nervous system lymphoma: Secondary | ICD-10-CM | POA: Diagnosis not present

## 2023-04-25 DIAGNOSIS — N179 Acute kidney failure, unspecified: Secondary | ICD-10-CM | POA: Diagnosis not present

## 2023-04-25 LAB — CBC WITH DIFFERENTIAL/PLATELET
Abs Immature Granulocytes: 0.03 10*3/uL (ref 0.00–0.07)
Basophils Absolute: 0 10*3/uL (ref 0.0–0.1)
Basophils Relative: 0 %
Eosinophils Absolute: 0 10*3/uL (ref 0.0–0.5)
Eosinophils Relative: 0 %
HCT: 39.6 % (ref 39.0–52.0)
Hemoglobin: 13.3 g/dL (ref 13.0–17.0)
Immature Granulocytes: 0 %
Lymphocytes Relative: 18 %
Lymphs Abs: 1.4 10*3/uL (ref 0.7–4.0)
MCH: 30.3 pg (ref 26.0–34.0)
MCHC: 33.6 g/dL (ref 30.0–36.0)
MCV: 90.2 fL (ref 80.0–100.0)
Monocytes Absolute: 0.6 10*3/uL (ref 0.1–1.0)
Monocytes Relative: 8 %
Neutro Abs: 5.7 10*3/uL (ref 1.7–7.7)
Neutrophils Relative %: 74 %
Platelets: 134 10*3/uL — ABNORMAL LOW (ref 150–400)
RBC: 4.39 MIL/uL (ref 4.22–5.81)
RDW: 13.2 % (ref 11.5–15.5)
WBC: 7.7 10*3/uL (ref 4.0–10.5)
nRBC: 0 % (ref 0.0–0.2)

## 2023-04-25 LAB — COMPREHENSIVE METABOLIC PANEL
ALT: 49 U/L — ABNORMAL HIGH (ref 0–44)
ALT: 49 U/L — ABNORMAL HIGH (ref 0–44)
AST: 26 U/L (ref 15–41)
AST: 34 U/L (ref 15–41)
Albumin: 2.9 g/dL — ABNORMAL LOW (ref 3.5–5.0)
Albumin: 3.3 g/dL — ABNORMAL LOW (ref 3.5–5.0)
Alkaline Phosphatase: 69 U/L (ref 38–126)
Alkaline Phosphatase: 68 U/L (ref 38–126)
Anion gap: 8 (ref 5–15)
Anion gap: 12 (ref 5–15)
BUN: 24 mg/dL — ABNORMAL HIGH (ref 8–23)
BUN: 21 mg/dL (ref 8–23)
CO2: 36 mmol/L — ABNORMAL HIGH (ref 22–32)
CO2: 34 mmol/L — ABNORMAL HIGH (ref 22–32)
Calcium: 8.1 mg/dL — ABNORMAL LOW (ref 8.9–10.3)
Calcium: 8.3 mg/dL — ABNORMAL LOW (ref 8.9–10.3)
Chloride: 94 mmol/L — ABNORMAL LOW (ref 98–111)
Chloride: 93 mmol/L — ABNORMAL LOW (ref 98–111)
Creatinine, Ser: 1.18 mg/dL (ref 0.61–1.24)
Creatinine, Ser: 1.26 mg/dL — ABNORMAL HIGH (ref 0.61–1.24)
GFR, Estimated: >60 mL/min (ref >60)
GFR, Estimated: >60 mL/min (ref >60)
Glucose, Bld: 101 mg/dL — ABNORMAL HIGH (ref 70–99)
Glucose, Bld: 106 mg/dL — ABNORMAL HIGH (ref 70–99)
Potassium: 3.4 mmol/L — ABNORMAL LOW (ref 3.5–5.1)
Potassium: 3.6 mmol/L (ref 3.5–5.1)
Sodium: 138 mmol/L (ref 135–145)
Sodium: 139 mmol/L (ref 135–145)
Total Bilirubin: 1 mg/dL (ref 0.0–1.2)
Total Bilirubin: 1.1 mg/dL (ref 0.0–1.2)
Total Protein: 5 g/dL — ABNORMAL LOW (ref 6.5–8.1)
Total Protein: 6 g/dL — ABNORMAL LOW (ref 6.5–8.1)

## 2023-04-25 LAB — BLOOD GAS, ARTERIAL
Acid-Base Excess: 13.7 mmol/L — ABNORMAL HIGH (ref 0.0–2.0)
Bicarbonate: 37.6 mmol/L — ABNORMAL HIGH (ref 20.0–28.0)
Drawn by: 35043
FIO2: 100 %
MECHVT: 520 mL
O2 Saturation: 100 %
PEEP: 5 cmH2O
Patient temperature: 37.2
RATE: 16 {breaths}/min
pCO2 arterial: 43 mm[Hg] (ref 32–48)
pH, Arterial: 7.55 — ABNORMAL HIGH (ref 7.35–7.45)
pO2, Arterial: 198 mm[Hg] — ABNORMAL HIGH (ref 83–108)

## 2023-04-25 LAB — CBC
HCT: 40.5 % (ref 39.0–52.0)
Hemoglobin: 13.1 g/dL (ref 13.0–17.0)
MCH: 30 pg (ref 26.0–34.0)
MCHC: 32.3 g/dL (ref 30.0–36.0)
MCV: 92.7 fL (ref 80.0–100.0)
Platelets: 142 10*3/uL — ABNORMAL LOW (ref 150–400)
RBC: 4.37 MIL/uL (ref 4.22–5.81)
RDW: 13.4 % (ref 11.5–15.5)
WBC: 4.9 10*3/uL (ref 4.0–10.5)
nRBC: 0 % (ref 0.0–0.2)

## 2023-04-25 LAB — GLUCOSE, CAPILLARY
Glucose-Capillary: 104 mg/dL — ABNORMAL HIGH (ref 70–99)
Glucose-Capillary: 115 mg/dL — ABNORMAL HIGH (ref 70–99)

## 2023-04-25 LAB — URINALYSIS, DIPSTICK ONLY
Bilirubin Urine: NEGATIVE
Glucose, UA: NEGATIVE mg/dL
Hgb urine dipstick: NEGATIVE
Ketones, ur: NEGATIVE mg/dL
Leukocytes,Ua: NEGATIVE
Nitrite: NEGATIVE
Protein, ur: NEGATIVE mg/dL
Specific Gravity, Urine: 1.004 — ABNORMAL LOW (ref 1.005–1.030)
pH: 9 — ABNORMAL HIGH (ref 5.0–8.0)

## 2023-04-25 LAB — METHOTREXATE: Methotrexate: 0.49

## 2023-04-25 MED ORDER — FENTANYL CITRATE PF 50 MCG/ML IJ SOSY
PREFILLED_SYRINGE | INTRAMUSCULAR | Status: AC
  Filled 2023-04-25: qty 2

## 2023-04-25 MED ORDER — LORAZEPAM 2 MG/ML IJ SOLN
0.5000 mg | Freq: Once | INTRAMUSCULAR | Status: AC
  Administered 2023-04-25: 0.5 mg via INTRAVENOUS

## 2023-04-25 MED ORDER — SODIUM CHLORIDE 0.9% FLUSH
10.0000 mL | Freq: Two times a day (BID) | INTRAVENOUS | Status: DC
  Administered 2023-04-25 (×2): 10 mL
  Administered 2023-04-25: 40 mL
  Administered 2023-04-26 – 2023-04-27 (×3): 10 mL
  Administered 2023-04-28: 20 mL
  Administered 2023-04-28 – 2023-04-30 (×5): 10 mL
  Administered 2023-05-01: 20 mL
  Administered 2023-05-01 – 2023-05-07 (×12): 10 mL
  Administered 2023-05-08: 20 mL
  Administered 2023-05-08 – 2023-05-22 (×28): 10 mL

## 2023-04-25 MED ORDER — EPINEPHRINE PF 1 MG/ML IJ SOLN
0.3000 mg | Freq: Once | INTRAMUSCULAR | Status: DC
  Filled 2023-04-25: qty 1

## 2023-04-25 MED ORDER — LEUCOVORIN CALCIUM INJECTION 100 MG
15.0000 mg | Freq: Four times a day (QID) | INTRAMUSCULAR | Status: AC
  Administered 2023-04-25 – 2023-04-26 (×4): 16 mg via INTRAVENOUS
  Filled 2023-04-25 (×8): qty 0.8

## 2023-04-25 MED ORDER — PROPOFOL 1000 MG/100ML IV EMUL
INTRAVENOUS | Status: AC
  Administered 2023-04-25: 20 ug/kg/min via INTRAVENOUS
  Filled 2023-04-25: qty 100

## 2023-04-25 MED ORDER — ROCURONIUM BROMIDE 10 MG/ML (PF) SYRINGE
PREFILLED_SYRINGE | INTRAVENOUS | Status: AC
  Filled 2023-04-25: qty 10

## 2023-04-25 MED ORDER — DIPHENHYDRAMINE HCL 50 MG/ML IJ SOLN
25.0000 mg | Freq: Once | INTRAMUSCULAR | Status: AC
  Administered 2023-04-25: 25 mg via INTRAVENOUS

## 2023-04-25 MED ORDER — FAMOTIDINE IN NACL 20-0.9 MG/50ML-% IV SOLN: 20.0000 mg | Freq: Once | INTRAVENOUS | Status: AC

## 2023-04-25 MED ORDER — FENTANYL 2500MCG IN NS 250ML (10MCG/ML) PREMIX INFUSION: 0.0000 ug/h | INTRAVENOUS | Status: DC

## 2023-04-25 MED ORDER — LORAZEPAM 2 MG/ML IJ SOLN
0.5000 mg | Freq: Once | INTRAMUSCULAR | Status: AC
Start: 2023-04-25 — End: 2023-04-25
  Administered 2023-04-25: 0.5 mg via INTRAVENOUS
  Filled 2023-04-25: qty 1

## 2023-04-25 MED ORDER — ETOMIDATE 2 MG/ML IV SOLN
INTRAVENOUS | Status: AC
  Filled 2023-04-25: qty 20

## 2023-04-25 MED ORDER — PANTOPRAZOLE SODIUM 40 MG IV SOLR
40.0000 mg | INTRAVENOUS | Status: DC
  Administered 2023-04-25 – 2023-05-09 (×15): 40 mg via INTRAVENOUS
  Filled 2023-04-25 (×15): qty 10

## 2023-04-25 MED ORDER — MIDAZOLAM HCL 2 MG/2ML IJ SOLN
INTRAMUSCULAR | Status: AC
  Filled 2023-04-25: qty 2

## 2023-04-25 MED ORDER — SODIUM CHLORIDE 0.9% FLUSH
10.0000 mL | INTRAVENOUS | Status: DC | PRN
  Administered 2023-04-26: 10 mL
  Administered 2023-04-27: 30 mL

## 2023-04-25 MED ORDER — LORAZEPAM 2 MG/ML IJ SOLN
INTRAMUSCULAR | Status: AC
Start: 2023-04-25 — End: 2023-04-26
  Filled 2023-04-25: qty 1

## 2023-04-25 MED ORDER — PHENYLEPHRINE 80 MCG/ML (10ML) SYRINGE FOR IV PUSH (FOR BLOOD PRESSURE SUPPORT)
PREFILLED_SYRINGE | INTRAVENOUS | Status: AC
  Filled 2023-04-25: qty 10

## 2023-04-25 MED ORDER — LEVETIRACETAM IN NACL 1000 MG/100ML IV SOLN
1000.0000 mg | Freq: Once | INTRAVENOUS | Status: AC
  Administered 2023-04-25: 1000 mg via INTRAVENOUS
  Filled 2023-04-25 (×2): qty 100

## 2023-04-25 MED ORDER — DOCUSATE SODIUM 50 MG/5ML PO LIQD
100.0000 mg | Freq: Two times a day (BID) | ORAL | Status: DC
  Administered 2023-04-25 – 2023-04-27 (×4): 100 mg
  Filled 2023-04-25 (×4): qty 10

## 2023-04-25 MED ORDER — FENTANYL CITRATE (PF) 100 MCG/2ML IJ SOLN
INTRAMUSCULAR | Status: AC
  Filled 2023-04-25: qty 2

## 2023-04-25 MED ORDER — SUCCINYLCHOLINE CHLORIDE 200 MG/10ML IV SOSY: 100.0000 mg | PREFILLED_SYRINGE | Freq: Once | INTRAVENOUS | Status: AC

## 2023-04-25 MED ORDER — NOREPINEPHRINE 4 MG/250ML-% IV SOLN: 0.0000 ug/min | INTRAVENOUS | Status: DC

## 2023-04-25 MED ORDER — PROPOFOL 1000 MG/100ML IV EMUL
0.0000 ug/kg/min | INTRAVENOUS | Status: DC
Start: 2023-04-25 — End: 2023-04-27
  Administered 2023-04-26: 50 ug/kg/min via INTRAVENOUS
  Administered 2023-04-26: 35 ug/kg/min via INTRAVENOUS
  Administered 2023-04-26: 50 ug/kg/min via INTRAVENOUS
  Administered 2023-04-26 (×2): 45 ug/kg/min via INTRAVENOUS
  Administered 2023-04-27: 50 ug/kg/min via INTRAVENOUS
  Administered 2023-04-27: 25 ug/kg/min via INTRAVENOUS
  Administered 2023-04-27: 50 ug/kg/min via INTRAVENOUS
  Filled 2023-04-25: qty 100
  Filled 2023-04-25: qty 200
  Filled 2023-04-25 (×5): qty 100

## 2023-04-25 MED ORDER — MIDAZOLAM HCL 2 MG/2ML IJ SOLN
2.0000 mg | INTRAMUSCULAR | Status: DC | PRN
  Administered 2023-04-26 (×2): 2 mg via INTRAVENOUS
  Filled 2023-04-25 (×2): qty 2

## 2023-04-25 MED ORDER — SODIUM BICARBONATE 8.4 % IV SOLN
INTRAVENOUS | Status: DC
  Filled 2023-04-25: qty 150
  Filled 2023-04-25: qty 1000

## 2023-04-25 MED ORDER — FAMOTIDINE IN NACL 20-0.9 MG/50ML-% IV SOLN
INTRAVENOUS | Status: AC
  Administered 2023-04-25: 20 mg via INTRAVENOUS
  Filled 2023-04-25: qty 50

## 2023-04-25 MED ORDER — DIPHENHYDRAMINE HCL 50 MG/ML IJ SOLN
INTRAMUSCULAR | Status: AC
  Filled 2023-04-25: qty 1

## 2023-04-25 MED ORDER — POLYETHYLENE GLYCOL 3350 17 G PO PACK
17.0000 g | PACK | Freq: Every day | ORAL | Status: DC
  Administered 2023-04-26 – 2023-04-27 (×2): 17 g
  Filled 2023-04-25 (×2): qty 1

## 2023-04-25 MED ORDER — SUCCINYLCHOLINE CHLORIDE 200 MG/10ML IV SOSY
PREFILLED_SYRINGE | INTRAVENOUS | Status: AC
  Filled 2023-04-25: qty 10

## 2023-04-25 MED ORDER — MAGIC MOUTHWASH W/LIDOCAINE
5.0000 mL | Freq: Four times a day (QID) | ORAL | Status: DC | PRN
  Filled 2023-04-25: qty 5

## 2023-04-25 MED ORDER — CHLORHEXIDINE GLUCONATE CLOTH 2 % EX PADS
6.0000 | MEDICATED_PAD | Freq: Every day | CUTANEOUS | Status: DC
  Administered 2023-04-25: 6 via TOPICAL

## 2023-04-25 MED ORDER — MIDAZOLAM HCL 2 MG/2ML IJ SOLN
INTRAMUSCULAR | Status: AC
  Administered 2023-04-25: 4 mg
  Filled 2023-04-25: qty 4

## 2023-04-25 MED ORDER — POLYETHYLENE GLYCOL 3350 17 G PO PACK
17.0000 g | PACK | Freq: Every day | ORAL | Status: DC | PRN
  Administered 2023-04-28: 17 g via ORAL
  Filled 2023-04-25: qty 1

## 2023-04-25 MED ORDER — METHYLPREDNISOLONE SODIUM SUCC 125 MG IJ SOLR
125.0000 mg | Freq: Once | INTRAMUSCULAR | Status: AC
  Administered 2023-04-25: 125 mg via INTRAVENOUS

## 2023-04-25 MED ORDER — FENTANYL BOLUS VIA INFUSION: 30.0000 ug | INTRAVENOUS | Status: DC | PRN

## 2023-04-25 MED ORDER — ETOMIDATE 2 MG/ML IV SOLN: 25.0000 mg | Freq: Once | INTRAVENOUS | Status: AC

## 2023-04-25 MED ORDER — SUCCINYLCHOLINE CHLORIDE 200 MG/10ML IV SOSY
PREFILLED_SYRINGE | INTRAVENOUS | Status: AC
  Administered 2023-04-25: 100 mg via INTRAVENOUS
  Filled 2023-04-25: qty 10

## 2023-04-25 MED ORDER — FENTANYL BOLUS VIA INFUSION
25.0000 ug | INTRAVENOUS | Status: DC | PRN
  Administered 2023-04-25 – 2023-04-26 (×3): 50 ug via INTRAVENOUS
  Administered 2023-04-26: 75 ug via INTRAVENOUS
  Administered 2023-04-26: 50 ug via INTRAVENOUS

## 2023-04-25 MED ORDER — FENTANYL CITRATE PF 50 MCG/ML IJ SOSY: 25.0000 ug | PREFILLED_SYRINGE | Freq: Once | INTRAMUSCULAR | Status: DC

## 2023-04-25 MED ORDER — PROPOFOL 1000 MG/100ML IV EMUL: 5.0000 ug/kg/min | INTRAVENOUS | Status: DC

## 2023-04-25 MED ORDER — METHYLPREDNISOLONE SODIUM SUCC 125 MG IJ SOLR
INTRAMUSCULAR | Status: AC
  Filled 2023-04-25: qty 2

## 2023-04-25 MED ORDER — FENTANYL 2500MCG IN NS 250ML (10MCG/ML) PREMIX INFUSION
25.0000 ug/h | INTRAVENOUS | Status: DC
  Administered 2023-04-26: 75 ug/h via INTRAVENOUS
  Filled 2023-04-25: qty 250

## 2023-04-25 MED ORDER — ETOMIDATE 2 MG/ML IV SOLN
INTRAVENOUS | Status: AC
  Administered 2023-04-25: 25 mg via INTRAVENOUS
  Filled 2023-04-25: qty 20

## 2023-04-25 MED ORDER — FENTANYL 2500MCG IN NS 250ML (10MCG/ML) PREMIX INFUSION
INTRAVENOUS | Status: AC
  Administered 2023-04-25: 50 ug/h via INTRAVENOUS
  Filled 2023-04-25: qty 250

## 2023-04-25 NOTE — Progress Notes (Signed)
 Mobility Specialist - Progress Note   04/25/23 1246  Mobility  Activity Ambulated independently in hallway  Level of Assistance Independent  Assistive Device None  Distance Ambulated (ft) 500 ft  Activity Response Tolerated well  Mobility Referral Yes  Mobility visit 1 Mobility  Mobility Specialist Start Time (ACUTE ONLY) 1230  Mobility Specialist Stop Time (ACUTE ONLY) 1246  Mobility Specialist Time Calculation (min) (ACUTE ONLY) 16 min   Pt received in bed and agreeable to mobility. No complaints during session. Pt to EOB after session with all needs met.    [facility]

## 2023-04-25 NOTE — Plan of Care (Signed)
  Problem: Clinical Measurements: Goal: Cardiovascular complication will be avoided Outcome: Progressing   Problem: Activity: Goal: Risk for activity intolerance will decrease Outcome: Progressing   Problem: Nutrition: Goal: Adequate nutrition will be maintained Outcome: Progressing   Problem: Elimination: Goal: Will not experience complications related to bowel motility Outcome: Progressing   

## 2023-04-25 NOTE — Consult Note (Addendum)
 NAME:  Tyler [name], MRN:  [id number], DOB:  22-Jun-1950, LOS: 3 ADMISSION DATE:  04/22/2023, CONSULTATION/ SERVICE  DATE:  04/25/23 REFERRING MD:  Dr. Candise Che, CHIEF COMPLAINT: Neck swelling, drooling, difficulty swallowing, altered mental status with concern for partial seizures  History of Present Illness:  Mr. Zollner is a 73 year old gentleman with significant history of CNS relapse of large B-cell lymphoma, partial seizure who was admitted to cardiology service on 2/25 for second cycle of high-dose methotrexate with leucovorin rescue.  Patient also has prior history of paroxysmal atrial fibrillation not on anticoagulation due to bleeding risk, prostate cancer status post prostatectomy in 2021, pulmonary embolism, hepatitis B exposure.  Patient had high-grade large B-cell lymphoma and completed 6 cycles of R-CHOP with systemic remission in the past.  As per the primary oncologist, patient was doing well and receiving his medication as planned.  He was started on bicarb drip to minimize the methotrexate related toxicity.  However, this evening, oncologist noted increased neck swelling as well as patient had difficulty swallowing.  He was also noted to have drooling of saliva and developed altered mental status with facial twitching concerning for seizure activity.  There was concern about patient's ability to protect airway and possible angioedema (neck swelling), ED was consulted and patient was intubated emergently.  At the time of my evaluation, patient was intubated and unresponsive.  No family member at the bedside.  Rest of the information is as per the chart review and I was unable to independently verify with the patient.   Past Medical History:  Diagnosis Date   Cancer [facility])    prostate   Follicular lymphoma (HCC) 2024   History of pulmonary embolism    Hypertension    Myelodysplastic syndrome (HCC)    PAF (paroxysmal atrial fibrillation) (HCC)    Recurrent UTI     Past Surgical  History:  Procedure Laterality Date   APPLICATION OF CRANIAL NAVIGATION Right 03/10/2023   Procedure: APPLICATION OF CRANIAL NAVIGATION;  Surgeon: Tressie Stalker, MD;  Location: [facility] OR;  Service: Neurosurgery;  Laterality: Right;   BIOPSY  08/21/2022   Procedure: BIOPSY;  Surgeon: Beverley Fiedler, MD;  Location: [facility] ENDOSCOPY;  Service: Gastroenterology;;   CRANIOTOMY Right 03/10/2023   Procedure: PARIETAL CRANIOTOMY;  Surgeon: Tressie Stalker, MD;  Location: [facility] OR;  Service: Neurosurgery;  Laterality: Right;   ESOPHAGOGASTRODUODENOSCOPY (EGD) WITH PROPOFOL N/A 08/21/2022   Procedure: ESOPHAGOGASTRODUODENOSCOPY (EGD) WITH PROPOFOL;  Surgeon: Beverley Fiedler, MD;  Location: [facility] ENDOSCOPY;  Service: Gastroenterology;  Laterality: N/A;   IR IMAGING GUIDED PORT INSERTION  08/26/2022   LYMPHADENECTOMY Bilateral 09/01/2019   Procedure: LYMPHADENECTOMY;  Surgeon: Sebastian Ache, MD;  Location: [facility] ORS;  Service: Urology;  Laterality: Bilateral;   ROBOT ASSISTED LAPAROSCOPIC RADICAL PROSTATECTOMY N/A 09/01/2019   Procedure: XI ROBOTIC ASSISTED LAPAROSCOPIC RADICAL PROSTATECTOMY;  Surgeon: Sebastian Ache, MD;  Location: [facility] ORS;  Service: Urology;  Laterality: N/A;  3 HRS     Social History:   reports that he has never smoked. He has never used smokeless tobacco. He reports current alcohol use. He reports that he does not use drugs.   Family History:  His family history includes Stroke in his father.   Allergies Allergies  Allergen Reactions   Amitriptyline Other (See Comments)    Nightmares, Night sweats, Migraines    Amlodipine Swelling    BILATERAL FEET TO ANKLES   Neurontin [Gabapentin] Other (See Comments)    Nightmares; Night sweats; Headache  Review of Systems:   Unable to perform due to patient clinical condition.   Objective   Blood pressure (!) 184/97, pulse 83, temperature 98.9 F (37.2 C), temperature source Axillary, resp. rate 16, height 5\' 7"  (1.702 m), weight 84.8 kg, SpO2  100%.    Vent Mode: PRVC FiO2 (%):  [100 %] 100 % Set Rate:  [16 bmp] 16 bmp Vt Set:  [520 mL] 520 mL PEEP:  [5 cmH20] 5 cmH20 Plateau Pressure:  [16 cmH20] 16 cmH20   Intake/Output Summary (Last 24 hours) at 04/25/2023 2202 Last data filed at 04/25/2023 2133 Gross per 24 hour  Intake 3320 ml  Output 4100 ml  Net -780 ml   Filed Weights   04/23/23 1802  Weight: 84.8 kg    Examination: General: Alert male appears comfortable in bed.  No acute respiratory distress HEENT: Head is normocephalic and atraumatic.  Trachea central Lungs: Air entry bilaterally.  No rub.  Rhonchi appreciated Cardiac: S1-S2 present,  irregular rhythm.   Abdomen: Soft, nondistended, non-distended Extremities: No cyanosis or edema  Neuro: Patient alert awake oriented x 3.  Following verbal commands.  No motor deficit on limited neuroexam.     Labs   CBC: Recent Labs  Lab 04/22/23 1546 04/23/23 0501 04/24/23 0611 04/25/23 0528  WBC 6.1 5.2 6.6 7.7  NEUTROABS 5.4 3.8 5.5 5.7  HGB 13.5 13.0 13.4 13.3  HCT 41.5 38.9* 39.8 39.6  MCV 91.8 89.8 89.2 90.2  PLT 130* 135* 148* 134*    Basic Metabolic Panel: Recent Labs  Lab 04/22/23 1546 04/23/23 0501 04/24/23 0611 04/25/23 0528  NA 135 137 136 138  K 3.4* 3.1* 3.6 3.4*  CL 103 98 97* 94*  CO2 21* 32 31 36*  GLUCOSE 130* 99 88 101*  BUN 23 25* 24* 24*  CREATININE 0.90 0.89 0.82 1.18  CALCIUM 8.4* 7.8* 7.8* 8.1*  MG 1.8  --   --   --   PHOS 3.0  --   --   --    GFR: Estimated Creatinine Clearance: 60.6 mL/min (by C-G formula based on SCr of 1.18 mg/dL). Recent Labs  Lab 04/22/23 1546 04/23/23 0501 04/24/23 0611 04/25/23 0528  WBC 6.1 5.2 6.6 7.7    Liver Function Tests: Recent Labs  Lab 04/22/23 1546 04/23/23 0501 04/24/23 0611 04/25/23 0528  AST 19 17 26 26   ALT 45* 34 54* 49*  ALKPHOS 80 70 75 69  BILITOT 0.9 0.7 0.8 1.0  PROT 5.8* 4.7* 5.0* 5.0*  ALBUMIN 3.3* 2.7* 2.9* 2.9*   No results for input(s): "LIPASE",  "AMYLASE" in the last 168 hours. No results for input(s): "AMMONIA" in the last 168 hours.  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  Acute drug reaction versus infiltration around port -improving  Plan: -Continue invasive ventilatory support and will wean as tolerated -Patient received benzodiazepines and currently on propofol and fentanyl infusion. -Will obtain point-of-care  seizure monitoring, if positive, will consider continuous monitoring -Neurology consult for evaluation tomorrow morning -Continue Keppra -Will obtain ABG and adjust vent setting.  Bicarb infusion currently on hold, bicarb levels elevated and if alkalosis, will hold bicarb infusion and continue LR infusion in the setting of AKI for now -Patient has received 1 dose of Solu-Medrol and He is already improving.  Will hold off scheduled steroids at this time. - CT head, neck and chest for further evaluation of altered mental status and neck swelling.   DVT prophylaxis: Low medical weight heparin Pressure ulcer(s): N/A GI prophylaxis: PPI Lines: N/A Foley:  Yes, and it is still needed Code Status:  full code  The patient is critically ill with multiple organ system failure and requires high complexity decision making for assessment and support, frequent evaluation and titration of therapies, advanced monitoring, review of radiographic studies and interpretation of complex data.    Critical Care Time devoted to patient care services, exclusive of separately billable procedures, described in this note is 50 minutes.   Marice Potter, MD, MBA, [facility] Pulmonary, Critical care & IP See Amion for pager  If no response to pager , please call [phone] until 7pm After 7:00 pm call Elink  [phone] 04/26/2023, 1:16 AM

## 2023-04-25 NOTE — ED Provider Notes (Signed)
 I was called to bedside by the ICU team for emergent intubation of this patient.  After a quick chart review, electrolytes are normal, pressures are stable.  Intubation was successful on the first try.  Postintubation x-ray will be followed up by the ICU team.  Procedure Name: Intubation Date/Time: 04/25/2023 9:11 PM  Performed by: Rozelle Logan, DOPre-anesthesia Checklist: Patient identified Oxygen Delivery Method: Nasal cannula Preoxygenation: Pre-oxygenation with 100% oxygen Induction Type: IV induction and Rapid sequence Ventilation: Mask ventilation without difficulty Laryngoscope Size: Glidescope and 3 Grade View: Grade III Tube size: 7.5 mm Number of attempts: 1 Airway Equipment and Method: Video-laryngoscopy Placement Confirmation: ETT inserted through vocal cords under direct vision, Positive ETCO2 and Breath sounds checked- equal and bilateral Secured at: 25 cm Tube secured with: ETT holder Comments: Patient already had lip injury from seizure-like activity prior to intubation        Rozelle Logan, DO 04/25/23 2112

## 2023-04-25 NOTE — Progress Notes (Signed)
 VAST nurse asked by unit nurse to come assess port access as unit RN noted swelling in patient's neck bilaterally that was not present previously. Port access looks unremarkable; excellent blood return and flushes easily without increase of swelling in neck.  Both sides of neck appear to have fluid in subcutaneous tissue, right greater than left; area swollen but soft, and patient denies pain when palpated. Unit RN to notify MD

## 2023-04-25 NOTE — Progress Notes (Addendum)
 eLink Physician-Brief Progress Note Patient Name: Tyler [name] DOB: [birth year] MRN: [id number]   Date of Service  04/25/2023  HPI/Events of Note  New pt EVAL  eICU Interventions      B cell Lymphoma Partial seizure  Anti epileptics Monitor neuro status  Intubated Fentanyl Versed Levophed may be needed  Intubated for not able to protect airway On keppra Continuous EEG Keppra  Increase oral secretions KUB reviewed  OK to use feeding tube Scopolamine patch for secretions    Massie Maroon 04/25/2023, 8:47 PM

## 2023-04-25 NOTE — Progress Notes (Addendum)
 Tyler [name]   DOB:04-Aug-1950   ZO#:[id number]      ASSESSMENT & PLAN:  1.  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  S/p craniotomy with gross total resection 03/10/23.  - CT of head 04/07/2023 showed interval increase in size of tumor at the craniotomy site.  MRI of head 04/07/2023 increase in size of tumor with slightly increased surrounding edema. - Currently on Hi-dose methotrexate chemotherapy regimen which is more aggressive approach.  Received cycle 1 on 04/09/23. - Admitted for cycle 2 of chemotherapy regimen,  completed 04/23/2023. - IV sodium bicarb ongoing.   - Continue to monitor urine pH daily.  Urine pH 9.0 today - Monitor methotrexate level daily until <0.1. On 04/24/23 level 11.13. - Methotrexate level today 04/25/2023 is 0.49.  Recheck in a.m. - Continue IV leucovorin rescue until methotrexate level <0.1 - Plan for IV Rituxan as outpatient between 2nd and 3rd cycle of methotrexate and subsequently between treatment cycles. - Continue daily labs - Medical oncology/Dr. Candise Che following closely.     2.  Partial sensory seizures, left side -Complaining of left-sided paresthesia which he states causes his fingers to feel numb for a few seconds.   - May be due to partial sensory seizures. Was seen by Neuro, s/p EEG last admission, no seizures seen. - Currently on Keppra 750 mg po bid.  Started on 500 mg during last admission with siginificant improvement in symptoms.  Should continue to improve with treatment for CNS lymphoma. - Will consider repeat MRI brain due to ongoing symptoms - Continue to monitor closely   3.  History of Altered mental status - Improved to baseline. - Likely due to CNS lymphoma with edema - Monitor closely    4. Thrombocytopenia - Mild, likely due to recent chemotherapy treatment - platelets 134k today - No intervention required at this time   5.   Elevated blood glucose  - Monitor blood sugar levels closely. -Primary team following    6.  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  A-fib - S/p Eliquis, d/c'd due to high risk for bleeding  - Follow closely with cardiology   8.  Prostate cancer - Diagnosed 2021   9.  Hep B antibody positive, chronic - On tenofovir for hep B reactivation suppression in context of Rituxan use.  Plan is to continue for 6 months after his last chemotherapy. - Follow ID outpatient   10.  VTE prophylaxis - With Lovenox   11. Hypokalemia - likely due to Hi-dose steroids + bicarb - continue electrolyte replacement as ordered - monitor CMP      Code Status Full  Subjective:  Patient seen asleep in bed, awakens easily to verbal and tactile stimuli.  States he is just tired.  Denies pain, nausea, vomiting or other acute symptoms at this time.  No acute distress is noted.  Objective:  Vitals:   04/24/23 2131 04/25/23 0522  BP: (!) 163/96 (!) 154/97  Pulse: 66 80  Resp: 18 18  Temp: 99.3 F (37.4 C) 97.6 F (36.4 C)  SpO2: 100% 98%     Intake/Output Summary (Last 24 hours) at 04/25/2023 0951 Last data filed at 04/25/2023 0841 Gross per 24 hour  Intake 3120 ml  Output 2650 ml  Net 470 ml     REVIEW OF SYSTEMS:   Constitutional: Denies fevers,  chills or abnormal night sweats Eyes: Denies blurriness of vision, double vision or watery eyes Ears, nose, mouth, throat, and face: Denies mucositis or sore throat Respiratory: Denies cough, dyspnea or wheezes Cardiovascular: Denies palpitation, chest discomfort or lower extremity swelling Gastrointestinal:  Denies nausea, heartburn or change in bowel habits Skin: Denies abnormal skin rashes Lymphatics: Denies new lymphadenopathy or easy bruising Neurological: Denies numbness, tingling or new weaknesses Behavioral/Psych: Mood is stable, no new changes  All other systems were reviewed  with the patient and are negative.  PHYSICAL EXAMINATION: ECOG PERFORMANCE STATUS: 1 - Symptomatic but completely ambulatory  Vitals:   04/24/23 2131 04/25/23 0522  BP: (!) 163/96 (!) 154/97  Pulse: 66 80  Resp: 18 18  Temp: 99.3 F (37.4 C) 97.6 F (36.4 C)  SpO2: 100% 98%   Filed Weights   04/23/23 1802  Weight: 187 lb (84.8 kg)    GENERAL: alert, no distress and comfortable SKIN: skin color, texture, turgor are normal, no rashes or significant lesions EYES: normal, conjunctiva are pink and non-injected, sclera clear OROPHARYNX: no exudate, no erythema and lips, buccal mucosa, and tongue normal  NECK: supple, thyroid normal size, non-tender, without nodularity LYMPH: no palpable lymphadenopathy in the cervical, axillary or inguinal LUNGS: clear to auscultation and percussion with normal breathing effort HEART: regular rate & rhythm and no murmurs and no lower extremity edema ABDOMEN: abdomen soft, non-tender and normal bowel sounds MUSCULOSKELETAL: no cyanosis of digits and no clubbing  PSYCH: alert & oriented x 3 with fluent speech NEURO: no focal motor/sensory deficits   All questions were answered. The patient knows to call the clinic with any problems, questions or concerns.   The total time spent in the appointment was 40 minutes encounter with patient including review of chart and various tests results, discussions about plan of care and coordination of care plan  Dawson Bills, NP 04/25/2023 9:51 AM    Labs Reviewed:  Lab Results  Component Value Date   WBC 7.7 04/25/2023   HGB 13.3 04/25/2023   HCT 39.6 04/25/2023   MCV 90.2 04/25/2023   PLT 134 (L) 04/25/2023   Recent Labs    08/11/22 0316 08/11/22 1544 08/12/22 0515 08/12/22 1400 08/20/22 0302 08/21/22 0334 04/23/23 0501 04/24/23 0611 04/25/23 0528  NA 134*   < > 137   < > 137   < > 137 136 138  K 3.7   < > 3.3*   < > 4.0   < > 3.1* 3.6 3.4*  CL 99   < > 101   < > 102   < > 98 97* 94*  CO2  25   < > 28   < > 24   < > 32 31 36*  GLUCOSE 185*   < > 114*   < > 78   < > 99 88 101*  BUN 52*   < > 41*   < > 45*   < > 25* 24* 24*  CREATININE 1.58*   < > 1.44*   < > 3.38*   < > 0.89 0.82 1.18  CALCIUM 7.4*   < > 7.5*   < > 7.2*   < > 7.8* 7.8* 8.1*  GFRNONAA 47*   < > 52*   < > 19*   < > >60 >60 >60  PROT 4.6*  --  4.2*  --  3.9*   < > 4.7* 5.0* 5.0*  ALBUMIN 1.8*  1.7*   < > 1.7*  1.6*   < > 1.7*   < > 2.7* 2.9* 2.9*  AST 33  --  31  --  25   < > 17 26 26   ALT 31  --  31  --  18   < > 34 54* 49*  ALKPHOS 84  --  79  --  111   < > 70 75 69  BILITOT 3.2*  --  2.5*  --  0.8   < > 0.7 0.8 1.0  BILIDIR 1.7*  --  1.3*  --  0.2  --   --   --   --   IBILI 1.5*  --  1.2*  --  0.6  --   --   --   --    < > = values in this interval not displayed.    Studies Reviewed:  MR BRAIN W WO CONTRAST Result Date: 04/25/2023 CLINICAL DATA:  Initial evaluation for brain/CNS neoplasm, assess treatment response. History of B-cell lymphoma EXAM: MRI HEAD WITHOUT AND WITH CONTRAST TECHNIQUE: Multiplanar, multiecho pulse sequences of the brain and surrounding structures were obtained without and with intravenous contrast. CONTRAST:  8mL GADAVIST GADOBUTROL 1 MMOL/ML IV SOLN COMPARISON:  Comparison made with previous MRI from 04/07/2023 as well as earlier studies. FINDINGS: Brain: Postoperative changes from prior right parietal craniotomy again seen. 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 Size of the lesion is markedly decreased from prior, now measuring 2.9 x 3.5 x 2.3 cm (series 18, image 10). Superimposed susceptibility artifact consistent with blood products. Persistent but improved vasogenic edema within the adjacent posterior right cerebral hemisphere, with improved/resolved mass effect on the right lateral ventricle. Previously seen right to left shift has resolved. Findings consistent with interval response to therapy. Now seen is restricted diffusion involving the  right insular cortex and overlying posterior right frontal region (series 5, images 26, 30). Associated gyral swelling with T2/FLAIR signal abnormality within this location (series 9, image 28). No associated enhancement. 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 No other mass lesion or abnormal enhancement. No other acute or subacute infarct. No hydrocephalus or extra-axial fluid collection. Underlying cerebral white matter disease noted, stable. Vascular: Major intracranial vascular flow voids are maintained. Skull and upper cervical spine: Craniocervical junction within normal limits. Bone marrow signal intensity within normal limits. Post craniotomy changes at the right parietal calvarium without adverse features. Sinuses/Orbits: Globes and orbital soft tissues within normal limits. Mild mucosal thickening present about the ethmoidal air cells and maxillary sinuses. No mastoid effusion. Other: None. IMPRESSION: 1. Marked interval decrease in size of right parietal mass/lymphoma, now measuring 2.9 x 3.5 x 2.3 cm (previously 5.4 x 3.6 x 4.4 cm), consistent with interval response to therapy. Persistent but improved vasogenic edema within the adjacent posterior right cerebral hemisphere, with interval resolution of previously seen right-to-left midline shift. 2. 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 Correlation with symptomatology and EEG recommended. Evolving changes of subacute ischemia would be the primary differential consideration, but are felt to be less likely. Electronically Signed   By: Rise Mu M.D.   On: 04/25/2023 05:24   DG CHEST PORT 1 VIEW Result Date: 04/10/2023 CLINICAL DATA:  Shortness of breath.  History of lymphoma EXAM: PORTABLE CHEST 1 VIEW COMPARISON:  X-ray 08/11/2022.  PET-CT 12/27/2022 FINDINGS: Right IJ chest port with tip overlying the right atrium. The port is  accessed.  Widening of the mediastinum is again seen. Enlarged cardiac silhouette. Basilar atelectasis or scar. No pneumothorax. Film is underinflated. No edema or effusion. Degenerative changes of the spine IMPRESSION: Underinflation.  Basilar atelectasis or scar. Widened mediastinum. Similar previous. Please correlate with prior PET-CT 12/27/2022 demonstrating prominent mediastinal fat. Chest port Electronically Signed   By: Karen Kays M.D.   On: 04/10/2023 19:17   EEG adult Result Date: 04/10/2023 Charlsie Quest, MD     04/11/2023  8:38 AM Patient Name: Eldredge Veldhuizen MRN: [id number] Epilepsy Attending: Charlsie Quest Referring Physician/Provider: Johney Maine, MD Date: 04/10/2023 Duration: 22.17 mins Patient history: 73yo M patient with rt parietal CNS lymphoma with intermittent left sided painful burning paresthesias and weakness. EEG to evaluate for seizure Level of alertness: Awake, asleep AEDs during EEG study: None Technical aspects: This EEG study was done with scalp electrodes positioned according to the 10-20 International system of electrode placement. Electrical activity was reviewed with band pass filter of 1-70Hz , sensitivity of 7 uV/mm, display speed of 74mm/sec with a 60Hz  notched filter applied as appropriate. EEG data were recorded continuously and digitally stored.  Video monitoring was available and reviewed as appropriate. Description: The posterior dominant rhythm consists of 8-9 Hz activity of moderate voltage (25-35 uV) seen predominantly in posterior head regions, symmetric and reactive to eye opening and eye closing. Sleep was characterized by vertex waves, sleep spindles (12 to 14 Hz), maximal frontocentral region. EEG showed continuous sharply contoured 3 to 6 Hz theta-delta slowing in right parietal region consistent with breach artifact. Sharp waves were noted in right parietal region. Hyperventilation and photic stimulation were not performed.   ABNORMALITY - Sharp wave,  right parietal region - Continuous slow,  right parietal region IMPRESSION: This study showed evidence of epileptogenicity arising from right parietal region. Additionally there is cortical dysfunction arising from right parietal region consistent with underlying craniotomy. No seizures were seen throughout the recording. Charlsie Quest   MR Brain W and Wo Contrast Result Date: 04/07/2023 CLINICAL DATA:  Provided history: Neuro deficit, persistent/recurrent, CNS neoplasm suspected. Diffuse large B-cell lymphoma. Left-sided weakness with midline shift on CT. EXAM: MRI HEAD WITHOUT AND WITH CONTRAST TECHNIQUE: Multiplanar, multiecho pulse sequences of the brain and surrounding structures were obtained without and with intravenous contrast. CONTRAST:  8mL GADAVIST GADOBUTROL 1 MMOL/ML IV SOLN COMPARISON:  Head CT 04/07/2023.  Brain MRI 03/07/2023 neck FINDINGS: Intermittently motion degraded examination. Most notably, the axial T1 post-contrast sequence and coronal T2 sequence are moderately motion degraded. Within this limitation findings are as follows. Brain: Since the prior MRI 06/05/2023, there has been right parietal craniotomy for biopsy/debulking of an enhancing right parietal lobe tumor. Non-acute blood products at the operative site. The residual enhancing tumor measures 5.4 x 3.6 x 4.4 cm (previously 4.4 x 3.0 x 3.3 when remeasured on prior). Prominent surrounding edema slightly increased. 6 mm leftward midline shift has also increased (previously 4 mm). Persistent effacement of portions of the right lateral ventricle. Persistent asymmetric prominence of the right lateral ventricle temporal horn consistent with ventricular entrapment. Trace postoperative subdural collection along the anterior right cerebral hemisphere. Mild-to-moderate multifocal T2 FLAIR hyperintense signal abnormality elsewhere within the cerebral white matter, nonspecific but compatible with chronic small vessel ischemic disease.  There is no acute infarct. Vascular: Maintained flow voids within the proximal large arterial vessels. Skull and upper cervical spine: Right parietal cranioplasty. No focal worrisome marrow lesion. Sinuses/orbits: No orbital mass or acute orbital finding.Mild mucosal thickening within the  left maxillary sinus. IMPRESSION: 1. Motion degraded examination. 2. Since the prior brain MRI 03/07/2023, there has been right parietal craniotomy for biopsy/debulking of an enhancing right parietal lobe tumor. The residual tumor has increased in size, now measuring 5.4 x 3.6 x 4.4 cm (previously 4.4 x 3.0 x 3.3 cm). Prominent surrounding edema has slightly increased. 6 mm leftward midline shift has also increased. Unchanged asymmetric prominence of the right lateral ventricle temporal horn consistent with ventricular entrapment. Electronically Signed   By: Jackey Loge D.O.   On: 04/07/2023 14:01   CT Head Wo Contrast Result Date: 04/07/2023 CLINICAL DATA:  Facial paralysis/weakness (CN 7) EXAM: CT HEAD WITHOUT CONTRAST TECHNIQUE: Contiguous axial images were obtained from the base of the skull through the vertex without intravenous contrast. RADIATION DOSE REDUCTION: This exam was performed according to the departmental dose-optimization program which includes automated exposure control, adjustment of the mA and/or kV according to patient size and/or use of iterative reconstruction technique. COMPARISON:  Head CT 03/06/2023, brain MR 03/07/2023 FINDINGS: Brain: No hemorrhage. No hydrocephalus. No extra-axial fluid collection postsurgical changes from a right parietal craniotomy with interval increase in size of the residual/recurrent tumor at the craniotomy site, now measuring up to 4.7 x 2.7 cm, previously 3.2 x 2.5 cm, when measured in a similar orientation. There is increased leftward midline shift, now measuring up to 6 mm, previously 3 mm. Unchanged disproportionate enlargement of the right temporal horn, worrisome for  ventricular entrapment. Vascular: No hyperdense vessel or unexpected calcification. Skull: Normal. Negative for fracture or focal lesion. Status post parietal craniotomy Sinuses/Orbits: No middle ear or mastoid effusion. Mucosal thickening in the bilateral maxillary sinuses, right-greater-than-left orbits are unremarkable. Other: None. IMPRESSION: 1. Postsurgical changes from a right parietal craniotomy with interval increase in size of the residual/recurrent tumor at the craniotomy site, now measuring up to 4.7 x 2.7 cm, previously 3.2 x 2.5 cm. There is increased leftward midline shift, now measuring up to 6 mm, previously 3 mm. Recommend further evaluation with brain MRI. 2. Unchanged asymmetric enlargement of the right temporal horn, compatible with ventricular entrapment Electronically Signed   By: Lorenza Cambridge M.D.   On: 04/07/2023 10:57     ADDENDUM  .Patient was Personally and independently interviewed, examined and relevant elements of the history of present illness were reviewed in details and an assessment and plan was created. All elements of the patient's history of present illness , assessment and plan were discussed in details with Hinton Dyer NP. The above documentation reflects our combined findings assessment and plan.   Patient was doing well till this evening. On leucovorin rescue.  While rounding at about 7:30 pm patient was noted to have significant neck swelling and swelling above above both clavicles. B/L normal breath sounds . No [place] crepitus. He for noted to have left facial twitching consistent with partial seizure and left upper extremity weakness and inability to handle his oral secretions. Rapid response was called. No hypoxia or hypotension noted. Patient received IV solumedrol 125mg , benadryl 25mg , famotidine for possible Angioedema and Ativan 1mg  for breakthrough seizures. I called PCCM was transfer of service and moved the patient to the MICU I called patients daughter  Karena Addison -- to inform her of change in situation and for consent regarding intubation of patient for airway protection. Stat CXR portable done-- no pneumothorax -Patient underwent Intubated by ER doctor and Dr Revonda Standard from PCCM arrived at bedside and took over patients care.  Patient received IV keppra 1000mg  for additional  bolus. CT head neck and chest were ordered and were pending. Daughter again updated about patients condition and concerns for SZ and possible CVA . MRI brain early today showed improved in rt parietal mass. Patient has afib not on anticoag which would be a CVA risk factor as well. Signout given to PCCM. Patient will be having tele neurology consultation and EEG and will defer further anti SZ medications mx to PCCM and neurology. From oncology standpoint patient is still on leucovorin rescue and IV bicarb till methotrexate levels <0.1 Daily zofran + Dexamethasone 20mg  till methotrexate levels <0.1 and then transition to Dexamethasone 4mg  BID. Medical oncology will continue to consult. PCCM primary team. Appreciate help from PCCM team.  Wyvonnia Lora MD MS .The total time spent in the appointment was 75 minutes* .  All of the patient's questions were answered with apparent satisfaction. The patient knows to call the clinic with any problems, questions or concerns.   Wyvonnia Lora MD MS AAHIVMS [facility] Hematology/Oncology Physician [facility]  .*Total Encounter Time as defined by the Centers for Medicare and Medicaid Services includes, in addition to the face-to-face time of a patient visit (documented in the note above) non-face-to-face time: obtaining and reviewing outside history, ordering and reviewing medications, tests or procedures, care coordination (communications with other health care professionals or caregivers) and documentation in the medical record.

## 2023-04-26 ENCOUNTER — Inpatient Hospital Stay (HOSPITAL_COMMUNITY)

## 2023-04-26 ENCOUNTER — Other Ambulatory Visit: Payer: Self-pay

## 2023-04-26 DIAGNOSIS — J9601 Acute respiratory failure with hypoxia: Secondary | ICD-10-CM

## 2023-04-26 DIAGNOSIS — J9610 Chronic respiratory failure, unspecified whether with hypoxia or hypercapnia: Secondary | ICD-10-CM

## 2023-04-26 DIAGNOSIS — N179 Acute kidney failure, unspecified: Secondary | ICD-10-CM | POA: Diagnosis not present

## 2023-04-26 DIAGNOSIS — C8339 Primary central nervous system lymphoma: Secondary | ICD-10-CM | POA: Diagnosis not present

## 2023-04-26 DIAGNOSIS — R221 Localized swelling, mass and lump, neck: Secondary | ICD-10-CM | POA: Diagnosis not present

## 2023-04-26 DIAGNOSIS — J9600 Acute respiratory failure, unspecified whether with hypoxia or hypercapnia: Secondary | ICD-10-CM | POA: Diagnosis not present

## 2023-04-26 DIAGNOSIS — R001 Bradycardia, unspecified: Secondary | ICD-10-CM

## 2023-04-26 DIAGNOSIS — R569 Unspecified convulsions: Secondary | ICD-10-CM | POA: Diagnosis not present

## 2023-04-26 DIAGNOSIS — I10 Essential (primary) hypertension: Secondary | ICD-10-CM

## 2023-04-26 DIAGNOSIS — R509 Fever, unspecified: Secondary | ICD-10-CM

## 2023-04-26 LAB — ECHOCARDIOGRAM COMPLETE
AR max vel: 2.48 cm2
AV Area VTI: 2.11 cm2
AV Area mean vel: 2.33 cm2
AV Mean grad: 3 mmHg
AV Peak grad: 5.7 mmHg
Ao pk vel: 1.19 m/s
Area-P 1/2: 2.7 cm2
Calc EF: 66.6 %
Height: 67 in
MV VTI: 2.1 cm2
S' Lateral: 2.7 cm
Single Plane A2C EF: 66 %
Single Plane A4C EF: 67.1 %
Weight: 2945.35 [oz_av]

## 2023-04-26 LAB — URINALYSIS, ROUTINE W REFLEX MICROSCOPIC
Bilirubin Urine: NEGATIVE
Glucose, UA: NEGATIVE mg/dL
Hgb urine dipstick: NEGATIVE
Ketones, ur: NEGATIVE mg/dL
Leukocytes,Ua: NEGATIVE
Nitrite: NEGATIVE
Protein, ur: NEGATIVE mg/dL
Specific Gravity, Urine: 1.023 (ref 1.005–1.030)
pH: 8 (ref 5.0–8.0)

## 2023-04-26 LAB — TRIGLYCERIDES: Triglycerides: 92 mg/dL (ref <150)

## 2023-04-26 LAB — GLUCOSE, CAPILLARY
Glucose-Capillary: 154 mg/dL — ABNORMAL HIGH (ref 70–99)
Glucose-Capillary: 119 mg/dL — ABNORMAL HIGH (ref 70–99)
Glucose-Capillary: 106 mg/dL — ABNORMAL HIGH (ref 70–99)
Glucose-Capillary: 103 mg/dL — ABNORMAL HIGH (ref 70–99)
Glucose-Capillary: 87 mg/dL (ref 70–99)
Glucose-Capillary: 90 mg/dL (ref 70–99)

## 2023-04-26 LAB — CBC WITH DIFFERENTIAL/PLATELET
Abs Immature Granulocytes: 0.02 10*3/uL (ref 0.00–0.07)
Basophils Absolute: 0 10*3/uL (ref 0.0–0.1)
Basophils Relative: 0 %
Eosinophils Absolute: 0 10*3/uL (ref 0.0–0.5)
Eosinophils Relative: 0 %
HCT: 40 % (ref 39.0–52.0)
Hemoglobin: 12.9 g/dL — ABNORMAL LOW (ref 13.0–17.0)
Immature Granulocytes: 0 %
Lymphocytes Relative: 7 %
Lymphs Abs: 0.3 10*3/uL — ABNORMAL LOW (ref 0.7–4.0)
MCH: 30 pg (ref 26.0–34.0)
MCHC: 32.3 g/dL (ref 30.0–36.0)
MCV: 93 fL (ref 80.0–100.0)
Monocytes Absolute: 0.1 10*3/uL (ref 0.1–1.0)
Monocytes Relative: 1 %
Neutro Abs: 4.2 10*3/uL (ref 1.7–7.7)
Neutrophils Relative %: 92 %
Platelets: 149 10*3/uL — ABNORMAL LOW (ref 150–400)
RBC: 4.3 MIL/uL (ref 4.22–5.81)
RDW: 13.5 % (ref 11.5–15.5)
WBC: 4.6 10*3/uL (ref 4.0–10.5)
nRBC: 0 % (ref 0.0–0.2)

## 2023-04-26 LAB — URINALYSIS, DIPSTICK ONLY
Bilirubin Urine: NEGATIVE
Glucose, UA: NEGATIVE mg/dL
Hgb urine dipstick: NEGATIVE
Ketones, ur: NEGATIVE mg/dL
Leukocytes,Ua: NEGATIVE
Nitrite: NEGATIVE
Protein, ur: NEGATIVE mg/dL
Specific Gravity, Urine: 1.023 (ref 1.005–1.030)
pH: 8 (ref 5.0–8.0)

## 2023-04-26 LAB — COMPREHENSIVE METABOLIC PANEL
ALT: 44 U/L (ref 0–44)
AST: 29 U/L (ref 15–41)
Albumin: 2.9 g/dL — ABNORMAL LOW (ref 3.5–5.0)
Alkaline Phosphatase: 61 U/L (ref 38–126)
Anion gap: 11 (ref 5–15)
BUN: 21 mg/dL (ref 8–23)
CO2: 30 mmol/L (ref 22–32)
Calcium: 8 mg/dL — ABNORMAL LOW (ref 8.9–10.3)
Chloride: 97 mmol/L — ABNORMAL LOW (ref 98–111)
Creatinine, Ser: 1.08 mg/dL (ref 0.61–1.24)
GFR, Estimated: >60 mL/min (ref >60)
Glucose, Bld: 149 mg/dL — ABNORMAL HIGH (ref 70–99)
Potassium: 3.3 mmol/L — ABNORMAL LOW (ref 3.5–5.1)
Sodium: 138 mmol/L (ref 135–145)
Total Bilirubin: 1.3 mg/dL — ABNORMAL HIGH (ref 0.0–1.2)
Total Protein: 5.2 g/dL — ABNORMAL LOW (ref 6.5–8.1)

## 2023-04-26 LAB — LACTIC ACID, PLASMA: Lactic Acid, Venous: 3.6 mmol/L (ref 0.5–1.9)

## 2023-04-26 LAB — MAGNESIUM: Magnesium: 2.1 mg/dL (ref 1.7–2.4)

## 2023-04-26 LAB — PHOSPHORUS: Phosphorus: 3.7 mg/dL (ref 2.5–4.6)

## 2023-04-26 LAB — CK TOTAL AND CKMB (NOT AT ARMC)
CK, MB: 1.4 ng/mL (ref 0.5–5.0)
Total CK: 35 U/L — ABNORMAL LOW (ref 49–397)

## 2023-04-26 LAB — PROCALCITONIN: Procalcitonin: <0.1 ng/mL

## 2023-04-26 LAB — MRSA NEXT GEN BY PCR, NASAL: MRSA by PCR Next Gen: NOT DETECTED

## 2023-04-26 MED ORDER — LEVETIRACETAM 500 MG PO TABS: 1000.0000 mg | ORAL_TABLET | Freq: Two times a day (BID) | ORAL | Status: DC

## 2023-04-26 MED ORDER — POTASSIUM CHLORIDE 20 MEQ PO PACK
40.0000 meq | PACK | Freq: Once | ORAL | Status: AC
  Administered 2023-04-26: 40 meq
  Filled 2023-04-26: qty 2

## 2023-04-26 MED ORDER — LACTATED RINGERS IV SOLN: INTRAVENOUS | Status: AC

## 2023-04-26 MED ORDER — CHLORHEXIDINE GLUCONATE CLOTH 2 % EX PADS
6.0000 | MEDICATED_PAD | Freq: Every evening | CUTANEOUS | Status: DC
  Administered 2023-04-26 – 2023-04-27 (×3): 6 via TOPICAL

## 2023-04-26 MED ORDER — HYDRALAZINE HCL 20 MG/ML IJ SOLN
10.0000 mg | INTRAMUSCULAR | Status: DC | PRN
  Administered 2023-04-26 – 2023-05-01 (×2): 20 mg via INTRAVENOUS
  Filled 2023-04-26 (×2): qty 1

## 2023-04-26 MED ORDER — ORAL CARE MOUTH RINSE: 15.0000 mL | OROMUCOSAL | Status: DC | PRN

## 2023-04-26 MED ORDER — ACETAMINOPHEN 325 MG PO TABS
650.0000 mg | ORAL_TABLET | ORAL | Status: DC | PRN
  Filled 2023-04-26: qty 2

## 2023-04-26 MED ORDER — METOPROLOL TARTRATE 12.5 MG HALF TABLET
12.5000 mg | ORAL_TABLET | Freq: Two times a day (BID) | ORAL | Status: DC
  Administered 2023-04-26: 12.5 mg via ORAL
  Filled 2023-04-26 (×2): qty 1

## 2023-04-26 MED ORDER — VITAL 1.5 CAL PO LIQD
1000.0000 mL | ORAL | Status: DC
Start: 2023-04-26 — End: 2023-04-27
  Administered 2023-04-26: 1000 mL
  Filled 2023-04-26 (×2): qty 1000

## 2023-04-26 MED ORDER — SCOPOLAMINE 1 MG/3DAYS TD PT72
1.0000 | MEDICATED_PATCH | TRANSDERMAL | Status: DC
  Administered 2023-04-26 – 2023-04-29 (×2): 1.5 mg via TRANSDERMAL
  Filled 2023-04-26 (×2): qty 1

## 2023-04-26 MED ORDER — LEUCOVORIN CALCIUM INJECTION 100 MG
15.0000 mg | Freq: Four times a day (QID) | INTRAMUSCULAR | Status: AC
  Administered 2023-04-26 – 2023-04-27 (×4): 16 mg via INTRAVENOUS
  Filled 2023-04-26 (×7): qty 0.8

## 2023-04-26 MED ORDER — ORAL CARE MOUTH RINSE
15.0000 mL | OROMUCOSAL | Status: DC
Start: 2023-04-26 — End: 2023-04-27
  Administered 2023-04-26 – 2023-04-27 (×19): 15 mL via OROMUCOSAL

## 2023-04-26 MED ORDER — IOHEXOL 300 MG/ML  SOLN
100.0000 mL | Freq: Once | INTRAMUSCULAR | Status: AC | PRN
  Administered 2023-04-26: 100 mL via INTRAVENOUS

## 2023-04-26 MED ORDER — PROSOURCE TF20 ENFIT COMPATIBL EN LIQD
60.0000 mL | Freq: Two times a day (BID) | ENTERAL | Status: DC
  Administered 2023-04-26 – 2023-04-27 (×2): 60 mL
  Filled 2023-04-26 (×2): qty 60

## 2023-04-26 MED ORDER — INSULIN ASPART 100 UNIT/ML IJ SOLN
0.0000 [IU] | INTRAMUSCULAR | Status: DC
  Administered 2023-04-28 – 2023-04-29 (×4): 2 [IU] via SUBCUTANEOUS
  Administered 2023-04-29: 3 [IU] via SUBCUTANEOUS
  Administered 2023-04-29 – 2023-04-30 (×4): 2 [IU] via SUBCUTANEOUS

## 2023-04-26 MED ORDER — LEVETIRACETAM 100 MG/ML PO SOLN
1000.0000 mg | Freq: Two times a day (BID) | ORAL | Status: DC
  Administered 2023-04-26 – 2023-04-28 (×4): 1000 mg
  Filled 2023-04-26 (×4): qty 10

## 2023-04-26 MED ORDER — LACTATED RINGERS IV BOLUS
1000.0000 mL | Freq: Once | INTRAVENOUS | Status: AC
  Administered 2023-04-26: 1000 mL via INTRAVENOUS

## 2023-04-26 MED ORDER — FAMOTIDINE 40 MG/5ML PO SUSR
20.0000 mg | Freq: Every day | ORAL | Status: DC
  Administered 2023-04-26 – 2023-04-27 (×2): 20 mg
  Filled 2023-04-26 (×2): qty 2.5

## 2023-04-26 MED ORDER — ACETAMINOPHEN 325 MG PO TABS
650.0000 mg | ORAL_TABLET | ORAL | Status: DC | PRN
  Administered 2023-04-26 – 2023-04-27 (×3): 650 mg via ORAL
  Filled 2023-04-26 (×4): qty 2

## 2023-04-26 NOTE — Plan of Care (Signed)
   Problem: Education: Goal: Knowledge of General Education information will improve Description: Including pain rating scale, medication(s)/side effects and non-pharmacologic comfort measures Outcome: Progressing   Problem: Nutrition: Goal: Adequate nutrition will be maintained Outcome: Progressing   Problem: Coping: Goal: Level of anxiety will decrease Outcome: Progressing

## 2023-04-26 NOTE — Progress Notes (Incomplete)
 Tyler [name]   DOB:06-13-1950   YQ#:[id number]      ASSESSMENT & PLAN:  1.  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  S/p craniotomy with gross total resection 03/10/23.  - CT of head 04/07/2023 showed interval increase in size of tumor at the craniotomy site.  MRI of head 04/07/2023 increase in size of tumor with slightly increased surrounding edema. - Currently on Hi-dose methotrexate chemotherapy regimen which is more aggressive approach.  Received cycle 1 on 04/09/23. - Admitted for cycle 2 of chemotherapy regimen,  completed 04/23/2023. - IV sodium bicarb ongoing.   - Continue to monitor urine pH daily.  Urine pH 9.0 today - Monitor methotrexate level daily until <0.1. On 04/24/23 level 11.13. - Methotrexate level today 04/25/2023 is 0.49.  Recheck in a.m. - Continue IV leucovorin rescue until methotrexate level <0.1 - Plan for IV Rituxan as outpatient between 2nd and 3rd cycle of methotrexate and subsequently between treatment cycles. - Continue daily labs - Medical oncology/Dr. Candise Che following closely.     2.  Partial sensory seizures, left side -Complaining of left-sided paresthesia which he states causes his fingers to feel numb for a few seconds.   - May be due to partial sensory seizures. Was seen by Neuro, s/p EEG last admission, no seizures seen. - Currently on Keppra 750 mg po bid.  Started on 500 mg during last admission with siginificant improvement in symptoms.  Should continue to improve with treatment for CNS lymphoma. - Will consider repeat MRI brain due to ongoing symptoms - Continue to monitor closely   3.  History of Altered mental status - Improved to baseline. - Likely due to CNS lymphoma with edema - Monitor closely    4. Thrombocytopenia - Mild, likely due to recent chemotherapy treatment - platelets 134k today - No intervention required at this time   5.   Elevated blood glucose  - Monitor blood sugar levels closely. -Primary team following    6.  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  A-fib - S/p Eliquis, d/c'd due to high risk for bleeding  - Follow closely with cardiology   8.  Prostate cancer - Diagnosed 2021   9.  Hep B antibody positive, chronic - On tenofovir for hep B reactivation suppression in context of Rituxan use.  Plan is to continue for 6 months after his last chemotherapy. - Follow ID outpatient   10.  VTE prophylaxis - With Lovenox   11. Hypokalemia - likely due to Hi-dose steroids + bicarb - continue electrolyte replacement as ordered - monitor CMP      Code Status Full  Subjective:  Patient seen asleep in bed, awakens easily to verbal and tactile stimuli.  States he is just tired.  Denies pain, nausea, vomiting or other acute symptoms at this time.  No acute distress is noted.  Objective:  Vitals:   04/24/23 2131 04/25/23 0522  BP: (!) 163/96 (!) 154/97  Pulse: 66 80  Resp: 18 18  Temp: 99.3 F (37.4 C) 97.6 F (36.4 C)  SpO2: 100% 98%     Intake/Output Summary (Last 24 hours) at 04/25/2023 0951 Last data filed at 04/25/2023 0841 Gross per 24 hour  Intake 3120 ml  Output 2650 ml  Net 470 ml     REVIEW OF SYSTEMS:   Constitutional: Denies fevers,  chills or abnormal night sweats Eyes: Denies blurriness of vision, double vision or watery eyes Ears, nose, mouth, throat, and face: Denies mucositis or sore throat Respiratory: Denies cough, dyspnea or wheezes Cardiovascular: Denies palpitation, chest discomfort or lower extremity swelling Gastrointestinal:  Denies nausea, heartburn or change in bowel habits Skin: Denies abnormal skin rashes Lymphatics: Denies new lymphadenopathy or easy bruising Neurological: Denies numbness, tingling or new weaknesses Behavioral/Psych: Mood is stable, no new changes  All other systems were reviewed  with the patient and are negative.  PHYSICAL EXAMINATION: ECOG PERFORMANCE STATUS: 1 - Symptomatic but completely ambulatory  Vitals:   04/24/23 2131 04/25/23 0522  BP: (!) 163/96 (!) 154/97  Pulse: 66 80  Resp: 18 18  Temp: 99.3 F (37.4 C) 97.6 F (36.4 C)  SpO2: 100% 98%   Filed Weights   04/23/23 1802  Weight: 187 lb (84.8 kg)    GENERAL: alert, no distress and comfortable SKIN: skin color, texture, turgor are normal, no rashes or significant lesions EYES: normal, conjunctiva are pink and non-injected, sclera clear OROPHARYNX: no exudate, no erythema and lips, buccal mucosa, and tongue normal  NECK: supple, thyroid normal size, non-tender, without nodularity LYMPH: no palpable lymphadenopathy in the cervical, axillary or inguinal LUNGS: clear to auscultation and percussion with normal breathing effort HEART: regular rate & rhythm and no murmurs and no lower extremity edema ABDOMEN: abdomen soft, non-tender and normal bowel sounds MUSCULOSKELETAL: no cyanosis of digits and no clubbing  PSYCH: alert & oriented x 3 with fluent speech NEURO: no focal motor/sensory deficits   All questions were answered. The patient knows to call the clinic with any problems, questions or concerns.   The total time spent in the appointment was 40 minutes encounter with patient including review of chart and various tests results, discussions about plan of care and coordination of care plan  Dawson Bills, NP 04/25/2023 9:51 AM    Labs Reviewed:  Lab Results  Component Value Date   WBC 7.7 04/25/2023   HGB 13.3 04/25/2023   HCT 39.6 04/25/2023   MCV 90.2 04/25/2023   PLT 134 (L) 04/25/2023   Recent Labs    08/11/22 0316 08/11/22 1544 08/12/22 0515 08/12/22 1400 08/20/22 0302 08/21/22 0334 04/23/23 0501 04/24/23 0611 04/25/23 0528  NA 134*   < > 137   < > 137   < > 137 136 138  K 3.7   < > 3.3*   < > 4.0   < > 3.1* 3.6 3.4*  CL 99   < > 101   < > 102   < > 98 97* 94*  CO2  25   < > 28   < > 24   < > 32 31 36*  GLUCOSE 185*   < > 114*   < > 78   < > 99 88 101*  BUN 52*   < > 41*   < > 45*   < > 25* 24* 24*  CREATININE 1.58*   < > 1.44*   < > 3.38*   < > 0.89 0.82 1.18  CALCIUM 7.4*   < > 7.5*   < > 7.2*   < > 7.8* 7.8* 8.1*  GFRNONAA 47*   < > 52*   < > 19*   < > >60 >60 >60  PROT 4.6*  --  4.2*  --  3.9*   < > 4.7* 5.0* 5.0*  ALBUMIN 1.8*  1.7*   < > 1.7*  1.6*   < > 1.7*   < > 2.7* 2.9* 2.9*  AST 33  --  31  --  25   < > 17 26 26   ALT 31  --  31  --  18   < > 34 54* 49*  ALKPHOS 84  --  79  --  111   < > 70 75 69  BILITOT 3.2*  --  2.5*  --  0.8   < > 0.7 0.8 1.0  BILIDIR 1.7*  --  1.3*  --  0.2  --   --   --   --   IBILI 1.5*  --  1.2*  --  0.6  --   --   --   --    < > = values in this interval not displayed.    Studies Reviewed:  MR BRAIN W WO CONTRAST Result Date: 04/25/2023 CLINICAL DATA:  Initial evaluation for brain/CNS neoplasm, assess treatment response. History of B-cell lymphoma EXAM: MRI HEAD WITHOUT AND WITH CONTRAST TECHNIQUE: Multiplanar, multiecho pulse sequences of the brain and surrounding structures were obtained without and with intravenous contrast. CONTRAST:  8mL GADAVIST GADOBUTROL 1 MMOL/ML IV SOLN COMPARISON:  Comparison made with previous MRI from 04/07/2023 as well as earlier studies. FINDINGS: Brain: Postoperative changes from prior right parietal craniotomy again seen. 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 Size of the lesion is markedly decreased from prior, now measuring 2.9 x 3.5 x 2.3 cm (series 18, image 10). Superimposed susceptibility artifact consistent with blood products. Persistent but improved vasogenic edema within the adjacent posterior right cerebral hemisphere, with improved/resolved mass effect on the right lateral ventricle. Previously seen right to left shift has resolved. Findings consistent with interval response to therapy. Now seen is restricted diffusion involving the  right insular cortex and overlying posterior right frontal region (series 5, images 26, 30). Associated gyral swelling with T2/FLAIR signal abnormality within this location (series 9, image 28). No associated enhancement. 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 No other mass lesion or abnormal enhancement. No other acute or subacute infarct. No hydrocephalus or extra-axial fluid collection. Underlying cerebral white matter disease noted, stable. Vascular: Major intracranial vascular flow voids are maintained. Skull and upper cervical spine: Craniocervical junction within normal limits. Bone marrow signal intensity within normal limits. Post craniotomy changes at the right parietal calvarium without adverse features. Sinuses/Orbits: Globes and orbital soft tissues within normal limits. Mild mucosal thickening present about the ethmoidal air cells and maxillary sinuses. No mastoid effusion. Other: None. IMPRESSION: 1. Marked interval decrease in size of right parietal mass/lymphoma, now measuring 2.9 x 3.5 x 2.3 cm (previously 5.4 x 3.6 x 4.4 cm), consistent with interval response to therapy. Persistent but improved vasogenic edema within the adjacent posterior right cerebral hemisphere, with interval resolution of previously seen right-to-left midline shift. 2. 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 Correlation with symptomatology and EEG recommended. Evolving changes of subacute ischemia would be the primary differential consideration, but are felt to be less likely. Electronically Signed   By: Rise Mu M.D.   On: 04/25/2023 05:24   DG CHEST PORT 1 VIEW Result Date: 04/10/2023 CLINICAL DATA:  Shortness of breath.  History of lymphoma EXAM: PORTABLE CHEST 1 VIEW COMPARISON:  X-ray 08/11/2022.  PET-CT 12/27/2022 FINDINGS: Right IJ chest port with tip overlying the right atrium. The port is  accessed.  Widening of the mediastinum is again seen. Enlarged cardiac silhouette. Basilar atelectasis or scar. No pneumothorax. Film is underinflated. No edema or effusion. Degenerative changes of the spine IMPRESSION: Underinflation.  Basilar atelectasis or scar. Widened mediastinum. Similar previous. Please correlate with prior PET-CT 12/27/2022 demonstrating prominent mediastinal fat. Chest port Electronically Signed   By: Karen Kays M.D.   On: 04/10/2023 19:17   EEG adult Result Date: 04/10/2023 Charlsie Quest, MD     04/11/2023  8:38 AM Patient Name: Kelten Enochs MRN: [id number] Epilepsy Attending: Charlsie Quest Referring Physician/Provider: Johney Maine, MD Date: 04/10/2023 Duration: 22.17 mins Patient history: 73yo M patient with rt parietal CNS lymphoma with intermittent left sided painful burning paresthesias and weakness. EEG to evaluate for seizure Level of alertness: Awake, asleep AEDs during EEG study: None Technical aspects: This EEG study was done with scalp electrodes positioned according to the 10-20 International system of electrode placement. Electrical activity was reviewed with band pass filter of 1-70Hz , sensitivity of 7 uV/mm, display speed of 67mm/sec with a 60Hz  notched filter applied as appropriate. EEG data were recorded continuously and digitally stored.  Video monitoring was available and reviewed as appropriate. Description: The posterior dominant rhythm consists of 8-9 Hz activity of moderate voltage (25-35 uV) seen predominantly in posterior head regions, symmetric and reactive to eye opening and eye closing. Sleep was characterized by vertex waves, sleep spindles (12 to 14 Hz), maximal frontocentral region. EEG showed continuous sharply contoured 3 to 6 Hz theta-delta slowing in right parietal region consistent with breach artifact. Sharp waves were noted in right parietal region. Hyperventilation and photic stimulation were not performed.   ABNORMALITY - Sharp wave,  right parietal region - Continuous slow,  right parietal region IMPRESSION: This study showed evidence of epileptogenicity arising from right parietal region. Additionally there is cortical dysfunction arising from right parietal region consistent with underlying craniotomy. No seizures were seen throughout the recording. Charlsie Quest   MR Brain W and Wo Contrast Result Date: 04/07/2023 CLINICAL DATA:  Provided history: Neuro deficit, persistent/recurrent, CNS neoplasm suspected. Diffuse large B-cell lymphoma. Left-sided weakness with midline shift on CT. EXAM: MRI HEAD WITHOUT AND WITH CONTRAST TECHNIQUE: Multiplanar, multiecho pulse sequences of the brain and surrounding structures were obtained without and with intravenous contrast. CONTRAST:  8mL GADAVIST GADOBUTROL 1 MMOL/ML IV SOLN COMPARISON:  Head CT 04/07/2023.  Brain MRI 03/07/2023 neck FINDINGS: Intermittently motion degraded examination. Most notably, the axial T1 post-contrast sequence and coronal T2 sequence are moderately motion degraded. Within this limitation findings are as follows. Brain: Since the prior MRI 06/05/2023, there has been right parietal craniotomy for biopsy/debulking of an enhancing right parietal lobe tumor. Non-acute blood products at the operative site. The residual enhancing tumor measures 5.4 x 3.6 x 4.4 cm (previously 4.4 x 3.0 x 3.3 when remeasured on prior). Prominent surrounding edema slightly increased. 6 mm leftward midline shift has also increased (previously 4 mm). Persistent effacement of portions of the right lateral ventricle. Persistent asymmetric prominence of the right lateral ventricle temporal horn consistent with ventricular entrapment. Trace postoperative subdural collection along the anterior right cerebral hemisphere. Mild-to-moderate multifocal T2 FLAIR hyperintense signal abnormality elsewhere within the cerebral white matter, nonspecific but compatible with chronic small vessel ischemic disease.  There is no acute infarct. Vascular: Maintained flow voids within the proximal large arterial vessels. Skull and upper cervical spine: Right parietal cranioplasty. No focal worrisome marrow lesion. Sinuses/orbits: No orbital mass or acute orbital finding.Mild mucosal thickening within the  left maxillary sinus. IMPRESSION: 1. Motion degraded examination. 2. Since the prior brain MRI 03/07/2023, there has been right parietal craniotomy for biopsy/debulking of an enhancing right parietal lobe tumor. The residual tumor has increased in size, now measuring 5.4 x 3.6 x 4.4 cm (previously 4.4 x 3.0 x 3.3 cm). Prominent surrounding edema has slightly increased. 6 mm leftward midline shift has also increased. Unchanged asymmetric prominence of the right lateral ventricle temporal horn consistent with ventricular entrapment. Electronically Signed   By: Jackey Loge D.O.   On: 04/07/2023 14:01   CT Head Wo Contrast Result Date: 04/07/2023 CLINICAL DATA:  Facial paralysis/weakness (CN 7) EXAM: CT HEAD WITHOUT CONTRAST TECHNIQUE: Contiguous axial images were obtained from the base of the skull through the vertex without intravenous contrast. RADIATION DOSE REDUCTION: This exam was performed according to the departmental dose-optimization program which includes automated exposure control, adjustment of the mA and/or kV according to patient size and/or use of iterative reconstruction technique. COMPARISON:  Head CT 03/06/2023, brain MR 03/07/2023 FINDINGS: Brain: No hemorrhage. No hydrocephalus. No extra-axial fluid collection postsurgical changes from a right parietal craniotomy with interval increase in size of the residual/recurrent tumor at the craniotomy site, now measuring up to 4.7 x 2.7 cm, previously 3.2 x 2.5 cm, when measured in a similar orientation. There is increased leftward midline shift, now measuring up to 6 mm, previously 3 mm. Unchanged disproportionate enlargement of the right temporal horn, worrisome for  ventricular entrapment. Vascular: No hyperdense vessel or unexpected calcification. Skull: Normal. Negative for fracture or focal lesion. Status post parietal craniotomy Sinuses/Orbits: No middle ear or mastoid effusion. Mucosal thickening in the bilateral maxillary sinuses, right-greater-than-left orbits are unremarkable. Other: None. IMPRESSION: 1. Postsurgical changes from a right parietal craniotomy with interval increase in size of the residual/recurrent tumor at the craniotomy site, now measuring up to 4.7 x 2.7 cm, previously 3.2 x 2.5 cm. There is increased leftward midline shift, now measuring up to 6 mm, previously 3 mm. Recommend further evaluation with brain MRI. 2. Unchanged asymmetric enlargement of the right temporal horn, compatible with ventricular entrapment Electronically Signed   By: Lorenza Cambridge M.D.   On: 04/07/2023 10:57     ADDENDUM  .Patient was Personally and independently interviewed, examined and relevant elements of the history of present illness were reviewed in details and an assessment and plan was created. All elements of the patient's history of present illness , assessment and plan were discussed in details with Hinton Dyer NP. The above documentation reflects our combined findings assessment and plan.   Patient was doing well till this evening. On leucovorin rescue.  While rounding at about 7:30 pm patient was noted to have significant neck swelling and swelling above above both clavicles. B/L normal breath sounds . No [place] crepitus. He for noted to have left facial twitching consistent with partial seizure and left upper extremity weakness and inability to handle his oral secretions. Rapid response was called. Patient received IV solumedrol 125mg , benadryl 25mg , famotidine for possible Angioedema and Ativan 1mg  for breakthrough seizures. I called PCCM was transfer of  Wyvonnia Lora MD MS

## 2023-04-26 NOTE — Progress Notes (Signed)
 WUA off fent gtt and very low diprivan Followed commands like open mouth and flex both knees with family translating Calm Tracks No visible seizures  Tried SBT but he failed instantly  Plan  - no extubation - back on sedation - dc ceribell     SIGNATURE    Dr. Kalman Shan, M.D., F.C.C.P,  Pulmonary and Critical Care Medicine Staff Physician, [facility] Director - Interstitial Lung Disease  Program  Pulmonary Fibrosis [facility] at [facility] [place], [place], [address]   Pager: [phone], If no answer  -> Check AMION or Try [phone] Telephone (clinical office): [phone] Telephone (research): [phone]  1:28 PM 04/26/2023

## 2023-04-26 NOTE — Progress Notes (Signed)
 Rapid Response Event Note   Reason for Call : concerned may have been having an allergic reaction     Initial Focused Assessment: Pt sitting on the side of the bed able to follow simple commands.  Pupils intact, grips unequal right greater than left.  Pt denies having trouble breathing however pt is gritting his teeth, with left facial twitching and drooling.  Sats are 100 on 3 LNC. Lung sounds decreased.  Pt assisted back into a supine position.  Pt denies pain.  Pt nods yes and no to answer questions at times unable to get a complete sentence.  Pt has has a history of altered mental status.  Interventions: Dr. Candise Che at the bedside and has given multiple orders.  Initiated per Primary RN.  Protocol Epi not given per MD orders.  However, it will be with pt to transfer to the ICU for precautions. VS see flowsheet. Suction at bedside.   Plan of Care: Transfer to the ICU for possible intubation for protection of airway. Pt unable to go to CT scan until stable per bedside MD.  Event Summary:   MD Notified: yes Call Time: 1951 Arrival Time:1953 End Time: 2030  Conley Rolls, RN

## 2023-04-26 NOTE — Progress Notes (Signed)
 Transported patient to and from CT without incident.  Patient suctioned before and after.

## 2023-04-26 NOTE — Procedures (Signed)
 Patient Name: Tyler [name]  MRN: [id number]  Epilepsy Attending: Charlsie Quest  Referring Physician/Provider: Johney Maine, MD  Duration: 04/25/2023 2222 to 04/26/2023 1327   Patient history: 73yo M patient with rt parietal CNS lymphoma with intermittent left sided painful burning paresthesias and weakness. EEG to evaluate for seizure   Level of alertness: comatose   AEDs during EEG study: LEV, propofol   Technical aspects: This EEG was obtained using a 10 lead EEG system positioned circumferentially without any parasagittal coverage (rapid EEG). Computer selected EEG is reviewed as  well as background features and all clinically significant events.   Description: EEG showed continuous generalized 3 to 6 Hz theta-delta slowing admixed with 13 to 15 Hz beta activity. Hyperventilation and photic stimulation were not performed.      ABNORMALITY - Continuous slow, generalized   IMPRESSION: This limited ceribell EEG is is suggestive of severe diffuse encephalopathy. No seizures were seen throughout the recording.  If concern of ictal-interictal activity persists, conventional video EEG can be considered.   Tyler [name]

## 2023-04-26 NOTE — Consult Note (Signed)
 NAME:  Tyler [name], MRN:  [id number], DOB:  Sep 29, 1950, LOS: 4 ADMISSION DATE:  04/22/2023, CONSULTATION/ SERVICE  DATE:  04/25/23 REFERRING MD:  Dr. Candise Che, CHIEF COMPLAINT: Neck swelling, drooling, difficulty swallowing, altered mental status with concern for partial seizures  History of Present Illness:  Tyler [name] is a 73 year old gentleman with significant history of CNS relapse of large B-cell lymphoma, partial seizure who was admitted to cardiology service on 2/25 for second cycle of high-dose methotrexate with leucovorin rescue.  Patient also has prior history of paroxysmal atrial fibrillation not on anticoagulation due to bleeding risk, prostate cancer status post prostatectomy in 2021, pulmonary embolism, hepatitis B exposure.  Patient had high-grade large B-cell lymphoma and completed 6 cycles of R-CHOP with systemic remission in the past.  As per the primary oncologist, patient was doing well and receiving his medication as planned.  He was started on bicarb drip to minimize the methotrexate related toxicity.  However, this evening, oncologist noted increased neck swelling as well as patient had difficulty swallowing.  He was also noted to have drooling of saliva and developed altered mental status with facial twitching concerning for seizure activity.  There was concern about patient's ability to protect airway and possible angioedema (neck swelling), ED was consulted and patient was intubated emergently.  At the time of my evaluation, patient was intubated and unresponsive.  No family member at the bedside.  Rest of the information is as per the chart review and I was unable to independently verify with the patient.   PAST    has a past medical history of Cancer (HCC), Follicular lymphoma (HCC) (2024), History of pulmonary embolism, Hypertension, Myelodysplastic syndrome (HCC), PAF (paroxysmal atrial fibrillation) (HCC), and Recurrent UTI.   has a past surgical history that includes  Robot assisted laparoscopic radical prostatectomy (N/A, 09/01/2019); Lymphadenectomy (Bilateral, 09/01/2019); Esophagogastroduodenoscopy (egd) with propofol (N/A, 08/21/2022); biopsy (08/21/2022); IR IMAGING GUIDED PORT INSERTION (08/26/2022); Craniotomy (Right, 03/10/2023); and Application of cranial navigation (Right, 03/10/2023).   EVENTS   04/22/2023 - ADMIT  2/27  - MRI brain - Marked interval decrease in size of right parietal mass/lymphoma, now measuring 2.9 x 3.5 x 2.3 cm (previously 5.4 x 3.6 x 4.4 cm), consistent with interval response to therapy. Persistent but improved vasogenic edema within the adjacent posterior right cerebral hemisphere, with interval resolution of previously seen right-to-left midline shift. 2. 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 Correlation with symptomatology and EEG recommended. Evolving changes of subacute ischemia would be the primary differential consideration, but are felt to be less likely.  2/28 - Transferred to ICU and intubated. Witnessed by Dr Candise Che with have left neck swelling, gurgling, and twitching left side -> then partially respnsvive -> ativan/confused -> EDP intubated. Around 7.30-8pm    SUBJECTIVE/OVERNIGHT/INTERVAL HX    04/26/23: Coretrack placed early hours .Intubated early hours.  On the ventilator FiO2 50%.  Low-grade fever since 04/17/2023 but normal white count. Ceribell EEG oongoing. Prmary language for aptient is Swahili. SBP HIGH with HR 40s - on scheduled lopressor . Ceribel shows    ceribell EEG is is suggestive of severe diffuse encephalopathy. No seizures were seen throughout the recording.    Objective   Blood pressure (!) 162/90, pulse (!) 48, temperature (!) 97.5 F (36.4 C), resp. rate 18, height 5\' 7"  (1.702 m), weight 83.5 kg, SpO2 100%.    Vent Mode: PRVC FiO2 (%):  [50 %-100 %] 50 %  Set Rate:  [16 bmp-18 bmp] 18  bmp Vt Set:  [520 mL] 520 mL PEEP:  [5 cmH20] 5 cmH20 Plateau Pressure:  [16 cmH20-18 cmH20] 17 cmH20   Intake/Output Summary (Last 24 hours) at 04/26/2023 0935 Last data filed at 04/26/2023 0800 Gross per 24 hour  Intake 2565.72 ml  Output 3575 ml  Net -1009.28 ml   Filed Weights   04/23/23 1802 04/26/23 0500  Weight: 84.8 kg 83.5 kg    Examination: General Appearance:  Looks criticall ill OBESE - yes Head:  Normocephalic, without obvious abnormality, atraumatic Eyes:  PERRL - yes, conjunctiva/corneas - muddy     Ears:  Normal external ear canals, both ears Nose:  G tube - YES Throat:  ETT TUBE - YEs , OG tube - no. CERIBEELL EEG + Neck:  Supple,  No enlargement/tenderness/nodules Lungs: Clear to auscultation bilaterally, Ventilator   Synchrony - yes Heart:  S1 and S2 normal, no murmur, CVP - no.  Pressors - no Abdomen:  Soft, no masses, no organomegaly Genitalia / Rectal:  Not done Extremities:  Extremities- intact Skin:  ntact in exposed areas . Sacral area - no decub per rN Neurologic:  Sedation - diprivan and fent gtt and bic gtt -> RASS - -4 . Moves all 4s - x. CAM-ICU - NO . 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  Acute drug reaction versus infiltration around port -improving  Plan: -Continue invasive ventilatory support and will wean as tolerated -Patient received benzodiazepines and currently on propofol and fentanyl infusion. -Will obtain point-of-care seizure monitoring, if positive, will consider continuous monitoring -Neurology consult for evaluation tomorrow morning -Continue Keppra -Will obtain ABG and adjust vent setting.  Bicarb infusion currently on hold, bicarb levels elevated  and if alkalosis, will hold bicarb infusion and continue LR infusion in the setting of AKI for now -Patient has received 1 dose of Solu-Medrol and He is already improving.  Will hold off scheduled steroids at this time. - CT head, neck and chest for further evaluation of altered mental status and neck swelling.     ASSESSMENT / PLAN:  PULMONARY  A:  Acute respiratory failure 04/25/2023 evening associated with neck swelling and seizures and potential postictal state and intubated by EDP. Had neck swelling at time  04/26/2023 -> does not meet extubation SBT criteria due to ongoing sedation needs.  On propofol and fentanyl. NEck swelling resolved Onclology does not suspect anaphylaxis. CT neck looks fine  P:   PRVC VAP bundle Tryptase check   ONCOLOGY    CNS relapse of large B-cell lymphoma, right parietal lobe Diffuse large B-cell lymphoma - Initially diagnosed May 2024 - S/p chemotherapy R-CHOP.  S/p craniotomy with gross total resection 03/10/23.  - CT of head 04/07/2023 showed interval increase in size of tumor at the craniotomy site.  MRI of head 04/07/2023 increase in size of tumor with slightly increased surrounding edema. - Currently on Hi-dose methotrexate chemotherapy regimen which is more aggressive approach.  Received cycle 1 on 04/09/23. - Admitted for cycle 2 of chemotherapy regimen,  completed 04/23/2023.  PLAN- Per oncolgy - IV sodium bicarb ongoing til methotrexate level < 0/1 - IV dexamethassonme for brain edema 20mg  daily till methotreaxate leve <0.1 - -  Continue IV leucovorin rescue until methotrexate level <0.1 - Continue to monitor urine pH daily.  Urine pH 9.0 today - Monitor methotrexate level daily until <0.1. -> stop bic at this point - Methotrexate level 3/1 - Dr Candise Che monitoring  - Plan for IV Rituxan as outpatient between 2nd and 3rd cycle of methotrexate and subsequently between treatment cycles.  - first week of March 2025 if discharged - can be delayed if  needed - Continue daily labs - Medical oncology/Dr. Candise Che following closely.       NEUROLOGIC   Partial sensory seizures, left side -Complaining of left-sided paresthesia which he states causes his fingers to feel numb for a few seconds.   - May be due to partial sensory seizures - seen by NSGY 04/07/23 -AS of 2/28: y on Keppra 750 mg po bid.  (Started on 500 mg during last admission with siginificant improvement in symptoms) = per pnco.  Should continue to improve with treatment for CNS lymphoma. MRI brain 2/28: with improvement in mass - Had partial complext 2/28 witnessed by Oncology Dr Candise Che - LEft sided focal -> then EDP intubated    Acute encephalopathy  -on 04/25/2203 felt due to CNS lymphoma with edema  - 04/25/2023: Morning reports of relatively normal mental status but at night sudden worsening with obtundation and potential seizures and intubated. MRI Mass improvd but 2/27 - New diffusion signal abnormality involving the right insular cortex and overlying right frontal lobe, favored to reflect sequelae of seizur  3/1 - RASS -4 on diprivan and fentanyl . Ceribell ongoing but not good tool for focal seizures  P:   - d.w Dr Iver Nestle Ongoing ceribell  EEG Get regular EEG but cannot do over weekend 31/ and 3/3 Ideally needs continuous - but needs transfer to [facility] ( d/w Dr Candise Che - no current need for chemo at [facility] for next 11 days oas of 04/26/23) Aim for clinical exam with wake up assessment due to barriers with EEG   Increase keppra t 1000mg  bid    VASCULAR A:   Hypertensive and Bradycardic on lopressors  P:  DC lopressor Rx Hydralazine  CARDIAC STRUCTURAL A: Normal ECHO aug 2024  P: Repeat ECHO   INFECTIOUS A:     Hep B antibody positive, chronic - On tenofovir for hep B reactivation suppression in context of Rituxan use.  P:     Plan is to continue for 6 months after his last chemotherapy. - Follow ID outpatient  NEw FEVER 04/25/23  Plan  -  Blood culture x 2 - UA - PCT - Lactate - tracheal aspirate   RENAL A:  AKI  2/28   - resolved   P:  Fluids bicarb till methotexate level < 0.1 Continue LR at 75mL  ELECTROLYTES A:  Low K P: replete   GASTROINTESTINAL A:   Panda +. Abd soft  P:   Start TF Trickles  HEMATOLOGIC   - HEME A:  Mild anemia of critical illness onset less than 13 g% 04/26/2023   P:  - PRBC for hgb </= 6.9gm%    - exceptions are   -  if ACS susepcted/confirmed then transfuse for hgb </= 8.0gm%,  or    -  active bleeding with hemodynamic instability, then transfuse regardless of hemoglobin value   At at all times try to transfuse 1 unit prbc as possible with exception of active hemorrhage   HEMATOLOGIC - Platelets A Mild thrombocytopenia improving  P Monitor Lovenox 40  mg every 24 hours  ENDOCRINE A:   At risk for hypo and hyperglycemia P:   SSI     Best practice:  Diet: TF trickele 3/12/5 Pain/Anxiety/Delirium protocol (if indicated): PAD VAP protocol (if indicated): yes DVT prophylaxis: lovnoex GI prophylaxis: pepcid Glucose control: ssi Mobility: bed rest Code Status: full Family Communication:   3/1 - Daughter Sherron Flemings - > [phone] => updated her over phone  Disposition: ICU [facility] . Might need to go to [facility]    D/w Dr Candise Che at bedside   ATTESTATION & SIGNATURE   The patient Tyler [name] is critically ill with multiple organ systems failure and requires high complexity decision making for assessment and support, frequent evaluation and titration of therapies, application of advanced monitoring technologies and extensive interpretation of multiple databases and discussion with other appropriate health care personnel such as bedside nurses, social workers, case Production designer, theatre/television/film, consultants, respiratory therapists, nutritionists, secretaries etc.,  Critical care time includes but is not restricted to just documentation time. Documentation can happen in  parallel or sequential to care time depending on case mix urgency and priorities for the shift. So, overall critical Care Time devoted to patient care services described in this note is  60  Minutes.   This time reflects time of care of this signee Dr Kalman Shan which includ does not reflect procedure time, or teaching time or supervisory time of PA/NP/Med student/Med Resident etc but could involve care discussion time     Dr. Kalman Shan, M.D., [facility].C.P Pulmonary and Critical Care Medicine Staff Physician, [facility] [place] Pulmonary and Critical Care Pager: [phone], If no answer or between  15:00h - 7:00h: call [phone]  04/26/2023 9:36 AM    LABS    PULMONARY Recent Labs  Lab 04/25/23 2327  PHART 7.55*  PCO2ART 43  PO2ART 198*  HCO3 37.6*  O2SAT 100    CBC Recent Labs  Lab 04/25/23 0528 04/25/23 2245 04/26/23 0500  HGB 13.3 13.1 12.9*  HCT 39.6 40.5 40.0  WBC 7.7 4.9 4.6  PLT 134* 142* 149*    COAGULATION No results for input(s): "INR" in the last 168 hours.  CARDIAC  No results for input(s): "TROPONINI" in the last 168 hours. No results for input(s): "PROBNP" in the last 168 hours.   CHEMISTRY Recent Labs  Lab 04/22/23 1546 04/23/23 0501 04/24/23 0611 04/25/23 0528 04/25/23 2245 04/26/23 0500  NA 135 137 136 138 139 138  K 3.4* 3.1* 3.6 3.4* 3.6 3.3*  CL 103 98 97* 94* 93* 97*  CO2 21* 32 31 36* 34* 30  GLUCOSE 130* 99 88 101* 106* 149*  BUN 23 25* 24* 24* 21 21  CREATININE 0.90 0.89 0.82 1.18 1.26* 1.08  CALCIUM 8.4* 7.8* 7.8* 8.1* 8.3* 8.0*  MG 1.8  --   --   --   --   --   PHOS 3.0  --   --   --   --   --    Estimated Creatinine Clearance: 65.8 mL/min (by C-G formula based on SCr of 1.08 mg/dL).   LIVER Recent Labs  Lab 04/23/23 0501 04/24/23 0611 04/25/23 0528 04/25/23 2245 04/26/23 0500  AST 17 26 26  34 29  ALT 34 54* 49* 49* 44  ALKPHOS 70 75 69 68 61  BILITOT 0.7 0.8 1.0 1.1 1.3*  PROT 4.7*  5.0* 5.0* 6.0* 5.2*  ALBUMIN 2.7* 2.9* 2.9* 3.3* 2.9*     INFECTIOUS  No results for input(s): "LATICACIDVEN", "PROCALCITON" in the last 168 hours.   ENDOCRINE CBG (last 3)  Recent Labs    04/25/23 2350 04/26/23 0356 04/26/23 0808  GLUCAP 115* 154* 119*         IMAGING x48h  - image(s) personally visualized  -   highlighted in bold CT CHEST W CONTRAST Result Date: 04/26/2023 CLINICAL DATA:  Head neck cancer.  Airway compromise with intubation EXAM: CT CHEST WITH CONTRAST TECHNIQUE: Multidetector CT imaging of the chest was performed during intravenous contrast administration. RADIATION DOSE REDUCTION: This exam was performed according to the departmental dose-optimization program which includes automated exposure control, adjustment of the mA and/or kV according to patient size and/or use of iterative reconstruction technique. CONTRAST:  OMNIPAQUE IOHEXOL 300 MG/ML  SOLN COMPARISON:  Head CT 04/16/2023 FINDINGS: Cardiovascular: Normal heart size. No pericardial effusion. Atheromatous calcification of the coronary arteries. Porta catheter with tip at the upper right atrium. Mediastinum/Nodes: Located endotracheal and enteric tube. No mass or adenopathy. Lungs/Pleura: Multi segment opacification in the lower lobes with volume loss, worse on the left. No consolidation, edema, effusion, or pneumothorax. Upper Abdomen: Enteric tube with tip at the pylorus. No acute finding Musculoskeletal: Thoracic spondylosis.  No acute finding IMPRESSION: Multi segment atelectasis in the lower lobes Electronically Signed   By: Tiburcio Pea M.D.   On: 04/26/2023 06:31   CT HEAD WO CONTRAST ( ) Result Date: 04/26/2023 CLINICAL DATA:  Mental status change.  History of CNS lymphoma. EXAM: CT HEAD WITHOUT CONTRAST TECHNIQUE: Contiguous axial images were obtained from the base of the skull through the vertex without intravenous contrast. RADIATION DOSE REDUCTION: This exam was performed according to the  departmental dose-optimization program which includes automated exposure control, adjustment of the mA and/or kV according to patient size and/or use of iterative reconstruction technique. COMPARISON:  Brain MRI 04/24/2023 FINDINGS: Brain: Cortical edema at the right insula correlating with abnormality on prior brain MRI. Small area of blood products in the parasagittal right parietal lobe with adjacent swelling, operative site. No evidence of interval infarct, hydrocephalus, or collection. Vascular: No hyperdense vessel or unexpected calcification. Skull: Unremarkable right parietal craniotomy site Sinuses/Orbits: Negative IMPRESSION: No acute finding when compared to 04/24/2023 brain MRI. Electronically Signed   By: Tiburcio Pea M.D.   On: 04/26/2023 05:09   CT SOFT TISSUE NECK W CONTRAST Result Date: 04/26/2023 CLINICAL DATA:  Soft tissue infection suspected. Neck swelling and airway compromise EXAM: CT NECK WITH CONTRAST TECHNIQUE: Multidetector CT imaging of the neck was performed using the standard protocol following the bolus administration of intravenous contrast. RADIATION DOSE REDUCTION: This exam was performed according to the departmental dose-optimization program which includes automated exposure control, adjustment of the mA and/or kV according to patient size and/or use of iterative reconstruction technique. CONTRAST:  OMNIPAQUE IOHEXOL 300 MG/ML  SOLN COMPARISON:  04/16/2023 head CT FINDINGS: Pharynx and larynx: The airway is collapsed around endotracheal tube and enteric tube with fluid layering in the nasopharynx. This limits assessment of the mucosal surfaces, no evidence of submucosal edema. Salivary glands: No inflammation, mass, or stone. Thyroid: Normal. Lymph nodes: None enlarged or heterogeneous. Vascular: No acute finding Limited intracranial: Negative Visualized orbits: Negative Mastoids and visualized paranasal sinuses: Clear Skeleton: No acute or aggressive finding. Upper  chest: Clear apical lungs. IMPRESSION: No acute finding. Assessment of mucosal surfaces limited by airway collapse in the setting of intubation. Electronically Signed   By: Tiburcio Pea M.D.   On: 04/26/2023 05:03  DG Abd 1 View Result Date: 04/26/2023 CLINICAL DATA:  Check gastric catheter placement EXAM: ABDOMEN - 1 VIEW COMPARISON:  None Available. FINDINGS: Weighted feeding catheter is noted in the distal stomach directed towards the pole or is. No free air is seen. No obstructive changes are noted. IMPRESSION: Feeding catheter as described. Electronically Signed   By: Alcide Clever M.D.   On: 04/26/2023 02:11   DG CHEST PORT 1 VIEW Result Date: 04/25/2023 CLINICAL DATA:  Check endotracheal tube placement EXAM: PORTABLE CHEST 1 VIEW COMPARISON:  Film from earlier in the same day. FINDINGS: Endotracheal tube is noted 5.3 cm above the carina. Gastric catheter extends into the stomach. Right chest wall port is again seen. Cardiac shadow is stable. No focal infiltrate is noted. Mild basilar atelectasis is seen. IMPRESSION: Tubes and lines as described. Electronically Signed   By: Alcide Clever M.D.   On: 04/25/2023 21:44   DG Chest Port 1 View Result Date: 04/25/2023 CLINICAL DATA:  Altered mental status EXAM: PORTABLE CHEST 1 VIEW COMPARISON:  04/10/2023 FINDINGS: Cardiac shadow is enlarged but stable. Right chest wall port is again seen. Mild basilar atelectasis is seen. No bony abnormality is noted. IMPRESSION: Mild basilar atelectasis. Electronically Signed   By: Alcide Clever M.D.   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 History of B-cell lymphoma EXAM: MRI HEAD WITHOUT AND WITH CONTRAST TECHNIQUE: Multiplanar, multiecho pulse sequences of the brain and surrounding structures were obtained without and with intravenous contrast. CONTRAST:  8mL GADAVIST GADOBUTROL 1 MMOL/ML IV SOLN  COMPARISON:  Comparison made with previous MRI from 04/07/2023 as well as earlier studies. FINDINGS: Brain: Postoperative changes from prior right parietal craniotomy again seen. 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 Size of the lesion is markedly decreased from prior, now measuring 2.9 x 3.5 x 2.3 cm (series 18, image 10). Superimposed susceptibility artifact consistent with blood products. Persistent but improved vasogenic edema within the adjacent posterior right cerebral hemisphere, with improved/resolved mass effect on the right lateral ventricle. Previously seen right to left shift has resolved. Findings consistent with interval response to therapy. Now seen is restricted diffusion involving the right insular cortex and overlying posterior right frontal region (series 5, images 26, 30). Associated gyral swelling with T2/FLAIR signal abnormality within this location (series 9, image 28). No associated enhancement. 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 No other mass lesion or abnormal enhancement. No other acute or subacute infarct. No hydrocephalus or extra-axial fluid collection. Underlying cerebral white matter disease noted, stable. Vascular: Major intracranial vascular flow voids are maintained. Skull and upper cervical spine: Craniocervical junction within normal limits. Bone marrow signal intensity within normal limits. Post craniotomy changes at the right parietal calvarium without adverse features. Sinuses/Orbits: Globes and orbital soft tissues within normal limits. Mild mucosal thickening present about the ethmoidal air cells and maxillary sinuses. No mastoid effusion. Other: None. IMPRESSION: 1. Marked interval decrease in size of right parietal mass/lymphoma, now measuring 2.9 x 3.5 x 2.3 cm (previously 5.4 x 3.6 x 4.4 cm), consistent with interval response to therapy. Persistent but improved vasogenic edema within the adjacent posterior right  cerebral hemisphere, with interval resolution of previously seen right-to-left midline shift. 2. 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 Correlation with symptomatology and EEG recommended. Evolving changes of  subacute ischemia would be the primary differential consideration, but are felt to be less likely. Electronically Signed   By: Rise Mu M.D.   On: 04/25/2023 05:24

## 2023-04-26 NOTE — Consult Note (Signed)
 NEUROLOGY CONSULT NOTE   Date of service: April 26, 2023 Patient Name: Tyler [name] MRN:  [id number] DOB:  1951-01-23 Chief Complaint: "seizure" Requesting Provider: Kalman Shan, MD  History of Present Illness  Hurbert Duran is a 73 y.o. male with hx of afib not on anticoagulation due to bleeding risk, prostate cancers s/p prostatectomy in 2021, PE, hep B exposure, CNS relapse of large B-cell lymphoma, partial seizure who was admitted to cardiology service on 2/25 for second cycle of high-dose methotrexate with leucovorin rescue. Patient was found to have significant neck swelling, difficulty breathing, left facial twitching and drooling on 2/28 and a rapid response was called.  Dr. Candise Che at the bedside and patient was transferred to ICU and intubation for airway protection.  He received 1000 mg IV Keppra and has continued on 750 mg twice daily.  Per chart review he did have partial sensory seizures on the left side of the body which have improved on Keppra and with treatment. He was still having minimal symptoms in the left leg that lasted a few seconds at a time. EEG completed 2/13 shows evidence of epileptogenicity arising from the right parietal region.  He was placed on limited Ceribell EEG which was negative for seizures. However, MRI Brain on 04/24/23 with R insular/frontal diffusion changes favored to be from seizure.   ROS   Unable to ascertain due to unresponsive, intubated/sedated  Past History   Past Medical History:  Diagnosis Date   Cancer (HCC)    prostate   Follicular lymphoma (HCC) 2024   History of pulmonary embolism    Hypertension    Myelodysplastic syndrome (HCC)    PAF (paroxysmal atrial fibrillation) (HCC)    Recurrent UTI     Past Surgical History:  Procedure Laterality Date   APPLICATION OF CRANIAL NAVIGATION Right 03/10/2023   Procedure: APPLICATION OF CRANIAL NAVIGATION;  Surgeon: Tressie Stalker, MD;  Location: [facility] OR;  Service: Neurosurgery;   Laterality: Right;   BIOPSY  08/21/2022   Procedure: BIOPSY;  Surgeon: Beverley Fiedler, MD;  Location[facility] ENDOSCOPY;  Service: Gastroenterology;;   CRANIOTOMY Right 03/10/2023   Procedure: PARIETAL CRANIOTOMY;  Surgeon: Tressie Stalker, MD;  Location: [facility] OR;  Service: Neurosurgery;  Laterality: Right;   ESOPHAGOGASTRODUODENOSCOPY (EGD) WITH PROPOFOL N/A 08/21/2022   Procedure: ESOPHAGOGASTRODUODENOSCOPY (EGD) WITH PROPOFOL;  Surgeon: Beverley Fiedler, MD;  Location: [facility] ENDOSCOPY;  Service: Gastroenterology;  Laterality: N/A;   IR IMAGING GUIDED PORT INSERTION  08/26/2022   LYMPHADENECTOMY Bilateral 09/01/2019   Procedure: LYMPHADENECTOMY;  Surgeon: Sebastian Ache, MD;  Location: [facility] ORS;  Service: Urology;  Laterality: Bilateral;   ROBOT ASSISTED LAPAROSCOPIC RADICAL PROSTATECTOMY N/A 09/01/2019   Procedure: XI ROBOTIC ASSISTED LAPAROSCOPIC RADICAL PROSTATECTOMY;  Surgeon: Sebastian Ache, MD;  Location: [facility] ORS;  Service: Urology;  Laterality: N/A;  3 HRS    Family History: Family History  Problem Relation Age of Onset   Stroke Father     Social History  reports that he has never smoked. He has never used smokeless tobacco. He reports current alcohol use. He reports that he does not use drugs.  Allergies  Allergen Reactions   Amitriptyline Other (See Comments)    Nightmares, Night sweats, Migraines    Amlodipine Swelling    BILATERAL FEET TO ANKLES   Neurontin [Gabapentin] Other (See Comments)    Nightmares; Night sweats; Headache    Medications   Current Facility-Administered Medications:    0.9 %  sodium chloride infusion, , Intravenous, Continuous, Candise Che, Corene Cornea, MD,  Stopped at 04/25/23 2019   acetaminophen (TYLENOL) tablet 650 mg, 650 mg, Oral, Q4H PRN, Massie Maroon, MD, 650 mg at 04/26/23 0241   alum & mag hydroxide-simeth (MAALOX/MYLANTA) 200-200-20 MG/5ML suspension 60 mL, 60 mL, Oral, Q4H PRN, Candise Che, Corene Cornea, MD   Chlorhexidine Gluconate Cloth 2 % PADS 6 each,  6 each, Topical, Nightly, Johney Maine, MD, 6 each at 04/26/23 0000   cholecalciferol (VITAMIN D3) 25 MCG (1000 UNIT) tablet 2,000 Units, 2,000 Units, Oral, Daily, Johney Maine, MD, 2,000 Units at 04/26/23 1008   docusate (COLACE) 50 MG/5ML liquid 100 mg, 100 mg, Per Tube, BID, Revonda Standard, Paras, MD, 100 mg at 04/26/23 1008   enoxaparin (LOVENOX) injection 40 mg, 40 mg, Subcutaneous, Q24H, Johney Maine, MD, 40 mg at 04/25/23 2333   famotidine (PEPCID) 40 MG/5ML suspension 20 mg, 20 mg, Per Tube, QHS, Kalman Shan, MD   feeding supplement (PROSource TF20) liquid 60 mL, 60 mL, Per Tube, BID, Kalman Shan, MD   feeding supplement (VITAL 1.5 CAL) liquid 1,000 mL, 1,000 mL, Per Tube, Q24H, Ramaswamy, Murali, MD   fentaNYL (SUBLIMAZE) bolus via infusion 25-100 mcg, 25-100 mcg, Intravenous, Q15 min PRN, Marice Potter, MD, 50 mcg at 04/26/23 0042   fentaNYL (SUBLIMAZE) injection 25 mcg, 25 mcg, Intravenous, Once, Revonda Standard, Paras, MD   fentaNYL in NS (45mcg/ml) infusion-PREMIX, 25-200 mcg/hr, Intravenous, Continuous, Revonda Standard, Paras, MD, Last Rate: 12.5 mL/hr at 04/26/23 0800, 125 mcg/hr at 04/26/23 0800   guaiFENesin-dextromethorphan (ROBITUSSIN DM) 100-10 MG/5ML syrup 10 mL, 10 mL, Oral, Q4H PRN, Johney Maine, MD   hydrALAZINE (APRESOLINE) injection 10-40 mg, 10-40 mg, Intravenous, Q4H PRN, Marchelle Gearing, Murali, MD   hydrocortisone (ANUSOL-HC) 2.5 % rectal cream 1 Application, 1 Application, Rectal, BID PRN, Candise Che, Corene Cornea, MD   insulin aspart (novoLOG) injection 0-15 Units, 0-15 Units, Subcutaneous, Q4H, Kalman Shan, MD   lactated ringers infusion, , Intravenous, Continuous, Revonda Standard, Paras, MD, Last Rate: 75 mL/hr at 04/26/23 0800, Infusion Verify at 04/26/23 0800   levETIRAcetam (KEPPRA) tablet 1,000 mg, 1,000 mg, Oral, BID, Ramaswamy, Murali, MD   lidocaine (LMX) 4 % cream, , Topical, TID PRN, Jimmye Norman, NP   magic mouthwash  w/lidocaine, 5 mL, Oral, QID PRN, Johney Maine, MD   midazolam (VERSED) injection 2 mg, 2 mg, Intravenous, Q1H PRN, Massie Maroon, MD, 2 mg at 04/26/23 0026   ondansetron (ZOFRAN) tablet 4-8 mg, 4-8 mg, Oral, Q8H PRN **OR** ondansetron (ZOFRAN-ODT) disintegrating tablet 4-8 mg, 4-8 mg, Oral, Q8H PRN **OR** ondansetron (ZOFRAN) injection 4 mg, 4 mg, Intravenous, Q8H PRN, 4 mg at 04/25/23 0516 **OR** ondansetron (ZOFRAN) 8 mg in sodium chloride 0.9 % 50 mL IVPB, 8 mg, Intravenous, Q8H PRN, Johney Maine, MD   Oral care mouth rinse, 15 mL, Mouth Rinse, Q2H, Candise Che, Corene Cornea, MD, 15 mL at 04/26/23 1010   Oral care mouth rinse, 15 mL, Mouth Rinse, PRN, Johney Maine, MD   pantoprazole (PROTONIX) injection 40 mg, 40 mg, Intravenous, Q24H, Malhotra, Paras, MD, 40 mg at 04/25/23 2333   polyethylene glycol (MIRALAX / GLYCOLAX) packet 17 g, 17 g, Per Tube, Daily, Revonda Standard, Paras, MD, 17 g at 04/26/23 1008   polyethylene glycol (MIRALAX / GLYCOLAX) packet 17 g, 17 g, Oral, Daily PRN, Revonda Standard, Paras, MD   propofol (DIPRIVAN) 1000 MG/100ML infusion, 0-50 mcg/kg/min, Intravenous, Continuous, Malhotra, Paras, MD, Last Rate: 17.81 mL/hr at 04/26/23 1013, 35 mcg/kg/min at 04/26/23 1013   scopolamine (TRANSDERM-SCOP) 1 MG/3DAYS 1.5 mg, 1  patch, Transdermal, Q72H, Elmaghraby, Olga Coaster, MD, 1.5 mg at 04/26/23 0249   senna-docusate (Senokot-S) tablet 1 tablet, 1 tablet, Oral, QHS PRN, Johney Maine, MD   sodium bicarbonate 150 mEq in dextrose 5 % 1,150 mL infusion, , Intravenous, Continuous, Rouson, Dietrich Pates, NP, Stopped at 05-07-2023 1950   sodium chloride flush (NS) 0.9 % injection 10-40 mL, 10-40 mL, Intracatheter, Q12H, Johney Maine, MD, 10 mL at 04/26/23 1010   sodium chloride flush (NS) 0.9 % injection 10-40 mL, 10-40 mL, Intracatheter, PRN, Johney Maine, MD   tenofovir alafenamide (VEMLIDY) tablet 25 mg, 25 mg, Oral, Daily, Johney Maine, MD, 25 mg at 04/26/23  1009  Vitals   Vitals:   04/26/23 0808 04/26/23 0900 04/26/23 1000 04/26/23 1100  BP:  (!) 162/90 (!) 190/99 (!) 152/89  Pulse: (!) 51 (!) 48 (!) 47 80  Resp: 18 18 18 18   Temp: 97.7 F (36.5 C) (!) 97.5 F (36.4 C) (!) 97.5 F (36.4 C) 97.9 F (36.6 C)  TempSrc:      SpO2: 100% 100% 100% 100%  Weight:      Height:        Body mass index is 28.83 kg/m.  Physical Exam   General: Laying comfortably in bed; intubated. HENT: Normal oropharynx and mucosa. Normal external appearance of ears and nose.  Neck: Supple, no pain or tenderness  CV: No JVD. No peripheral edema.  Pulmonary: Symmetric Chest rise. Breathing over vent. Abdomen: Soft to touch, non-tender.  Ext: No cyanosis, edema, or deformity  Skin: No rash. Normal palpation of skin.   Musculoskeletal: Normal digits and nails by inspection. No clubbing.   Neurologic Examination on propofol  Mental status/Cognition: obtunded, no response to loud voice/clap.  Facial grimace and moves bilateral upper extremities to noxious stimuli. Speech/language: mute, no speech Cranial nerves:   CN II Pupils equal and reactive to light, unableto assess.   CN III,IV,VI EOMI intact to dolls eyes   CN V Corneals intact BL   CN VII Symmetric facial grimace to noxious stimuli   CN VIII Does not turn head towards speech.   CN IX & X Cough intact gag intact.   CN XI Moves head side-to-side to noxious stimuli.   CN XII Unable to assess.   Sensory/motor:  Muscle bulk: Normal, tone flaccid in all extremities. To noxious stimuli, he localizes to the bilateral upper extremities.  He withdraws bilateral lower extremities to noxious stimuli.  Coordination/Complex Motor:  Unable to assess.     Labs/Imaging/Neurodiagnostic studies   CBC:  Recent Labs  Lab 05-07-2023 0528 2023-05-07 2245 04/26/23 0500  WBC 7.7 4.9 4.6  NEUTROABS 5.7  --  4.2  HGB 13.3 13.1 12.9*  HCT 39.6 40.5 40.0  MCV 90.2 92.7 93.0  PLT 134* 142* 149*   Basic  Metabolic Panel:  Lab Results  Component Value Date   NA 138 04/26/2023   K 3.3 (L) 04/26/2023   CO2 30 04/26/2023   GLUCOSE 149 (H) 04/26/2023   BUN 21 04/26/2023   CREATININE 1.08 04/26/2023   CALCIUM 8.0 (L) 04/26/2023   GFRNONAA >60 04/26/2023   GFRAA >60 09/11/2019   Lipid Panel: No results found for: "LDLCALC" HgbA1c:  Lab Results  Component Value Date   HGBA1C 5.2 04/09/2023   Urine Drug Screen: No results found for: "LABOPIA", "COCAINSCRNUR", "LABBENZ", "AMPHETMU", "THCU", "LABBARB"  Alcohol Level No results found for: "ETH" INR  Lab Results  Component Value Date   INR 1.2 04/07/2023  APTT  Lab Results  Component Value Date   APTT 28 07/29/2022   AED levels: No results found for: "PHENYTOIN", "ZONISAMIDE", "LAMOTRIGINE", "LEVETIRACETA"  CT Head without contrast(Personally reviewed): CTH was negative for a large hypodensity concerning for a large territory infarct or hyperdensity concerning for an ICH  MRI Brain(Personally reviewed): 1. Marked interval decrease in size of right parietal mass/lymphoma, now measuring 2.9 x 3.5 x 2.3 cm (previously 5.4 x 3.6 x 4.4 cm), consistent with interval response to therapy. Persistent but improved vasogenic edema within the adjacent posterior right cerebral hemisphere, with interval resolution of previously seen right-to-left midline shift. 2. 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 Correlation with symptomatology and EEG recommended. Evolving changes of subacute ischemia would be the primary differential consideration, but are felt to be less likely.  Neurodiagnostics rEEG:  This limited ceribell EEG is is suggestive of severe diffuse encephalopathy. No seizures were seen throughout the recording.   If concern of ictal-interictal activity persists, conventional video EEG can be considered.  ASSESSMENT   Sosaia Pittinger is a 73 y.o. male with hx of afib not on anticoagulation due to bleeding risk, prostate cancers s/p prostatectomy in 2021, PE, hep B exposure, CNS relapse of large B-cell lymphoma, partial seizure who is admitted for inpatient chemo. He was intubated and in the ICU for neck swelling, difficulty breathing, left facial twitching and noted to be obtunded and sedated.  He had limited cerebral EEG with no noted seizure.  However, he had MRI brain which is concerning for right insular and right frontal diffusion restriction most concerning for a focal seizure.  The MRI findings do raise high suspicion for persistent ictal/interictal activity and subclinical seizures that are not captured on cerebral EEG.  I would advise that patient be transferred to [facility] for continuous EEG to rule out subclinical seizures.  It would be hard to define a clear endpoint for AED escalation in this case without putting patient up on continuous EEG. We can certainly try to clinically define end point for AED escalation, if patient cannot get his chemo regimen at [facility].  RECOMMENDATIONS  - continue Keppra 1000 mg daily. - continue propofol at 23mcg/kg/min for now. - transfer to [facility] for cEEG.  ______________________________________________________________________    Leonia Reeves, NP [facility]   NEUROHOSPITALIST ADDENDUM Performed a face to face diagnostic evaluation.   I have reviewed the contents of history and physical exam as documented by PA/ARNP/Resident and agree with above documentation.  I have discussed and formulated the above plan as documented. Edits to the note have been made as needed.  Erick Blinks, MD [facility] [phone]   If 7pm to 7am, please call on call as listed on AMION.

## 2023-04-26 NOTE — Progress Notes (Signed)
 Briefly discussed with Dr. Marchelle Gearing, this is a 73 year old with known CNS lymphoma, on Keppra due to based on chart review episodic left-sided paresthesias concerning for focal seizures Hide altered mental status and facial twitching yesterday and ultimately intubated for concern for inability to protect airway as well as concern for swelling of the neck Now very obtunded but deeply sedated  Unfortunately conventional EEG is not available at [facility].  Rapid EEG has poor sensitivity for focal seizures, has been applied and is negative.  Would continue this to look for any secondary generalization If his mental status is not improving with weaning sedation would consider transfer to [facility] for continuous EEG monitoring over the weekend.  However would balance this with ability to continue receiving his oncological treatment as primarily the underlying etiology of his seizures is his lymphoma.  If transferred to [facility] would interrupt his oncological treatment, neurology will follow patient at [facility]  Dr. Marchelle Gearing to update neurology after further discussion with oncology  Regarding Keppra dose, based on his creatinine clearance there is further room to increase his Keppra to 1000 mg twice daily  These are curbside recommendations based upon the information readily available in the chart on brief review as well as history and examination information provided to me by requesting provider and do not replace a full detailed consult.  10 minutes of discussion, no charge note

## 2023-04-26 NOTE — Progress Notes (Addendum)
 Initial Nutrition Assessment  DOCUMENTATION CODES:   Not applicable  INTERVENTION:  - Once able to initiate TF, recommend: Vital 1.5 at 55 ml/h (1320 ml per day) *Would recommend starting at 74mL/hr and advancing by 10mL Q8H Prosource TF20 60 ml BID Provides 2140 kcal, 129 gm protein, 1008 ml free water daily  - Monitor magnesium, potassium, and phosphorus BID for at least 3 days, MD to replete as needed, as pt may be at risk for refeeding syndrome.  - FWF per CCM/MD.   - Monitor weight trends.  ADDENDUM (10:28): Received consult to initiate TF. Per discussion with CCM provider Dr. Marchelle Gearing, just trickle feeds today.  Will initiate Vital 1.5 @ 65mL/hr. Goal TF recs above for once able to advance.   NUTRITION DIAGNOSIS:   Inadequate oral intake related to inability to eat as evidenced by NPO status.  GOAL:   Patient will meet greater than or equal to 90% of their needs  MONITOR:   Vent status, Labs, Weight trends, TF tolerance  REASON FOR ASSESSMENT:   Consult Assessment of nutrition requirement/status  ASSESSMENT:   73 y.o. male with PMH prostate cancer s/p prostatectomy in 2021, pulmonary embolism, hepatitis B exposure, and high-grade large B-cell lymphoma and completed 6 cycles of R-CHOP (chemo) with systemic remission in the past. 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   2/25 Admit 2/28 Intubated 3/1 SBFT placed   Patient is currently intubated on ventilator support MV: 9.1 L/min Temp (24hrs), Avg:99.1 F (37.3 C), Min:97.7 F (36.5 C), Max:100.8 F (38.2 C)  RD working remotely.   Per chart review, patient with overall weight gain since July 2024. However, possible 7# or 4% weight loss in the past ~2 weeks since is significant for the time frame. He was on a regular diet prior to intubation and consuming 100% of all meals.  SBFT placed overnight, tip  in the stomach. Will provide TF recs for once able to feed enterally.   Medications reviewed and include: B-complex with vitamin C, 1000 units vitamin D, Colace, Miralax Fentanyl Propofol @ 25.62mL/hr (provides 668 kcals over 24 hours)  Labs reviewed:  K+ 3.3   NUTRITION - FOCUSED PHYSICAL EXAM:  RD working remotely  Diet Order:   Diet Order     None       EDUCATION NEEDS:  Not appropriate for education at this time  Skin:  Skin Assessment: Reviewed RN Assessment  Last BM:  2/24  Height:  Ht Readings from Last 1 Encounters:  04/23/23 5\' 7"  (1.702 m)   Weight:  Wt Readings from Last 1 Encounters:  04/26/23 83.5 kg   Ideal Body Weight:  67.27 kg  BMI:  Body mass index is 28.83 kg/m.  Estimated Nutritional Needs:  Kcal:  2100-2350 kcals Protein:  115-135 grams Fluid:  >/= 2.1L    Shelle Iron RD, LDN Contact via Secure Chat.

## 2023-04-26 NOTE — Plan of Care (Signed)
  Problem: Clinical Measurements: Goal: Respiratory complications will improve Outcome: Progressing Goal: Cardiovascular complication will be avoided Outcome: Progressing   Problem: Coping: Goal: Level of anxiety will decrease Outcome: Progressing   Problem: Education: Goal: Knowledge of General Education information will improve Description: Including pain rating scale, medication(s)/side effects and non-pharmacologic comfort measures Outcome: Not Progressing   Problem: Health Behavior/Discharge Planning: Goal: Ability to manage health-related needs will improve Outcome: Not Progressing   Problem: Clinical Measurements: Goal: Ability to maintain clinical measurements within normal limits will improve Outcome: Not Progressing Goal: Will remain free from infection Outcome: Not Progressing Goal: Diagnostic test results will improve Outcome: Not Progressing   Problem: Activity: Goal: Risk for activity intolerance will decrease Outcome: Not Progressing   Problem: Nutrition: Goal: Adequate nutrition will be maintained Outcome: Not Progressing

## 2023-04-27 ENCOUNTER — Other Ambulatory Visit: Payer: Self-pay

## 2023-04-27 ENCOUNTER — Encounter: Payer: Self-pay | Admitting: Hematology

## 2023-04-27 ENCOUNTER — Inpatient Hospital Stay (HOSPITAL_COMMUNITY)

## 2023-04-27 DIAGNOSIS — R569 Unspecified convulsions: Secondary | ICD-10-CM | POA: Diagnosis not present

## 2023-04-27 DIAGNOSIS — N179 Acute kidney failure, unspecified: Secondary | ICD-10-CM | POA: Diagnosis not present

## 2023-04-27 DIAGNOSIS — Z5111 Encounter for antineoplastic chemotherapy: Secondary | ICD-10-CM | POA: Diagnosis not present

## 2023-04-27 DIAGNOSIS — J9601 Acute respiratory failure with hypoxia: Secondary | ICD-10-CM | POA: Diagnosis not present

## 2023-04-27 DIAGNOSIS — R221 Localized swelling, mass and lump, neck: Secondary | ICD-10-CM | POA: Diagnosis not present

## 2023-04-27 DIAGNOSIS — C8339 Primary central nervous system lymphoma: Secondary | ICD-10-CM | POA: Diagnosis not present

## 2023-04-27 LAB — CBC WITH DIFFERENTIAL/PLATELET
Abs Immature Granulocytes: 0.03 10*3/uL (ref 0.00–0.07)
Basophils Absolute: 0 10*3/uL (ref 0.0–0.1)
Basophils Relative: 0 %
Eosinophils Absolute: 0 10*3/uL (ref 0.0–0.5)
Eosinophils Relative: 0 %
HCT: 38.3 % — ABNORMAL LOW (ref 39.0–52.0)
Hemoglobin: 12.4 g/dL — ABNORMAL LOW (ref 13.0–17.0)
Immature Granulocytes: 1 %
Lymphocytes Relative: 18 %
Lymphs Abs: 0.8 10*3/uL (ref 0.7–4.0)
MCH: 29.9 pg (ref 26.0–34.0)
MCHC: 32.4 g/dL (ref 30.0–36.0)
MCV: 92.3 fL (ref 80.0–100.0)
Monocytes Absolute: 0.1 10*3/uL (ref 0.1–1.0)
Monocytes Relative: 2 %
Neutro Abs: 3.4 10*3/uL (ref 1.7–7.7)
Neutrophils Relative %: 79 %
Platelets: 140 10*3/uL — ABNORMAL LOW (ref 150–400)
RBC: 4.15 MIL/uL — ABNORMAL LOW (ref 4.22–5.81)
RDW: 13.8 % (ref 11.5–15.5)
WBC: 4.3 10*3/uL (ref 4.0–10.5)
nRBC: 0 % (ref 0.0–0.2)

## 2023-04-27 LAB — URINALYSIS, DIPSTICK ONLY
Bilirubin Urine: NEGATIVE
Glucose, UA: NEGATIVE mg/dL
Ketones, ur: NEGATIVE mg/dL
Leukocytes,Ua: NEGATIVE
Nitrite: NEGATIVE
Protein, ur: NEGATIVE mg/dL
Specific Gravity, Urine: 1.019 (ref 1.005–1.030)
pH: 7 (ref 5.0–8.0)

## 2023-04-27 LAB — GLUCOSE, CAPILLARY
Glucose-Capillary: 81 mg/dL (ref 70–99)
Glucose-Capillary: 53 mg/dL — ABNORMAL LOW (ref 70–99)
Glucose-Capillary: 76 mg/dL (ref 70–99)
Glucose-Capillary: 67 mg/dL — ABNORMAL LOW (ref 70–99)
Glucose-Capillary: 68 mg/dL — ABNORMAL LOW (ref 70–99)
Glucose-Capillary: 69 mg/dL — ABNORMAL LOW (ref 70–99)
Glucose-Capillary: 98 mg/dL (ref 70–99)
Glucose-Capillary: 73 mg/dL (ref 70–99)
Glucose-Capillary: 112 mg/dL — ABNORMAL HIGH (ref 70–99)
Glucose-Capillary: 103 mg/dL — ABNORMAL HIGH (ref 70–99)

## 2023-04-27 LAB — COMPREHENSIVE METABOLIC PANEL
ALT: 33 U/L (ref 0–44)
AST: 20 U/L (ref 15–41)
Albumin: 2.7 g/dL — ABNORMAL LOW (ref 3.5–5.0)
Alkaline Phosphatase: 54 U/L (ref 38–126)
Anion gap: 8 (ref 5–15)
BUN: 26 mg/dL — ABNORMAL HIGH (ref 8–23)
CO2: 28 mmol/L (ref 22–32)
Calcium: 8 mg/dL — ABNORMAL LOW (ref 8.9–10.3)
Chloride: 104 mmol/L (ref 98–111)
Creatinine, Ser: 0.93 mg/dL (ref 0.61–1.24)
GFR, Estimated: >60 mL/min (ref >60)
Glucose, Bld: 112 mg/dL — ABNORMAL HIGH (ref 70–99)
Potassium: 3.6 mmol/L (ref 3.5–5.1)
Sodium: 140 mmol/L (ref 135–145)
Total Bilirubin: 0.9 mg/dL (ref 0.0–1.2)
Total Protein: 4.9 g/dL — ABNORMAL LOW (ref 6.5–8.1)

## 2023-04-27 LAB — PHOSPHORUS: Phosphorus: 2.9 mg/dL (ref 2.5–4.6)

## 2023-04-27 LAB — MAGNESIUM: Magnesium: 2 mg/dL (ref 1.7–2.4)

## 2023-04-27 LAB — LACTIC ACID, PLASMA: Lactic Acid, Venous: 2.1 mmol/L (ref 0.5–1.9)

## 2023-04-27 LAB — PROCALCITONIN: Procalcitonin: <0.1 ng/mL

## 2023-04-27 LAB — TRYPTASE: Tryptase: 3.3 ug/L (ref 2.2–13.2)

## 2023-04-27 LAB — METHOTREXATE
Methotrexate: 0.12
Methotrexate: 0.09

## 2023-04-27 MED ORDER — POTASSIUM CHLORIDE 20 MEQ PO PACK
40.0000 meq | PACK | Freq: Once | ORAL | Status: AC
  Administered 2023-04-27: 40 meq
  Filled 2023-04-27: qty 2

## 2023-04-27 MED ORDER — DEXTROSE IN LACTATED RINGERS 5 % IV SOLN: INTRAVENOUS | Status: DC

## 2023-04-27 MED ORDER — ORAL CARE MOUTH RINSE
15.0000 mL | Freq: Two times a day (BID) | OROMUCOSAL | Status: DC
  Administered 2023-04-28 – 2023-05-03 (×11): 15 mL via OROMUCOSAL

## 2023-04-27 MED ORDER — DEXTROSE 50 % IV SOLN
INTRAVENOUS | Status: AC
  Administered 2023-04-27: 25 mL
  Filled 2023-04-27: qty 50

## 2023-04-27 MED ORDER — IBUPROFEN 100 MG/5ML PO SUSP
200.0000 mg | Freq: Four times a day (QID) | ORAL | Status: DC | PRN
  Administered 2023-04-28 – 2023-05-09 (×5): 200 mg
  Filled 2023-04-27 (×5): qty 10

## 2023-04-27 MED ORDER — ALUM & MAG HYDROXIDE-SIMETH 200-200-20 MG/5ML PO SUSP
60.0000 mL | ORAL | Status: DC | PRN
Start: 2023-04-27 — End: 2023-05-23
  Administered 2023-05-11: 60 mL
  Filled 2023-04-27: qty 60

## 2023-04-27 MED ORDER — DEXTROSE 50 % IV SOLN: 25.0000 mL | Freq: Once | INTRAVENOUS | Status: AC

## 2023-04-27 MED ORDER — VITAMIN D 25 MCG (1000 UNIT) PO TABS
2000.0000 [IU] | ORAL_TABLET | Freq: Every day | ORAL | Status: DC
  Administered 2023-04-28 – 2023-05-22 (×24): 2000 [IU]
  Filled 2023-04-27 (×24): qty 2

## 2023-04-27 MED ORDER — DEXAMETHASONE 4 MG PO TABS
4.0000 mg | ORAL_TABLET | Freq: Two times a day (BID) | ORAL | Status: DC
  Administered 2023-04-27 – 2023-05-20 (×47): 4 mg
  Filled 2023-04-27 (×49): qty 1

## 2023-04-27 MED ORDER — IBUPROFEN 100 MG/5ML PO SUSP
200.0000 mg | Freq: Four times a day (QID) | ORAL | Status: DC | PRN
  Administered 2023-04-27: 200 mg via ORAL
  Filled 2023-04-27: qty 10

## 2023-04-27 MED ORDER — ACETAMINOPHEN 325 MG PO TABS
650.0000 mg | ORAL_TABLET | ORAL | Status: DC | PRN
  Administered 2023-04-28 – 2023-05-23 (×37): 650 mg
  Filled 2023-04-27 (×37): qty 2

## 2023-04-27 MED ORDER — DEXTROSE 50 % IV SOLN
INTRAVENOUS | Status: AC
  Administered 2023-04-27: 25 mL via INTRAVENOUS
  Filled 2023-04-27: qty 50

## 2023-04-27 MED ORDER — TENOFOVIR ALAFENAMIDE FUMARATE 25 MG PO TABS
25.0000 mg | ORAL_TABLET | Freq: Every day | ORAL | Status: DC
Start: 2023-04-28 — End: 2023-05-07
  Administered 2023-04-28 – 2023-05-07 (×10): 25 mg
  Filled 2023-04-27 (×10): qty 1

## 2023-04-27 NOTE — Progress Notes (Signed)
 [facility] ADULT ICU REPLACEMENT PROTOCOL   The patient does apply for the [facility] Adult ICU Electrolyte Replacment Protocol based on the criteria listed below:   1.Exclusion criteria: TCTS, ECMO, Dialysis, and Myasthenia Gravis patients 2. Is GFR >/= 30 ml/min? Yes.    Patient's GFR today is >60 3. Is SCr </= 2? Yes.   Patient's SCr is 0.93 mg/dL 4. Did SCr increase >/= 0.5 in 24 hours? No. 5.Pt's weight >40kg  Yes.   6. Abnormal electrolyte(s): K  7. Electrolytes replaced per protocol 8.  Call MD STAT for K+ </= 2.5, Phos </= 1, or Mag </= 1 Physician:  Minna Antis Aiya Keach 04/27/2023 5:25 AM

## 2023-04-27 NOTE — Progress Notes (Signed)
 [name]  HEMATOLOGY/ONCOLOGY INPATIENT PROGRESS NOTE  Date of Service: 04/26/2023  Inpatient Attending: .Kalman Shan, MD   SUBJECTIVE  Patient was seen in oncologic followup this afternoon. He has been extubated and appears to be breathing comofrtably. His left sided weakness in the upper extremity has resolved. No overt facial partial sz of left sided seizure. He was able to answer questions appropriately. No significant neck swelling noted. Has had fever spikes. Being evaluated by PCCM team.  OBJECTIVE:  NAD  PHYSICAL EXAMINATION: . Vitals:   04/27/23 1400 04/27/23 1600 04/27/23 1700 04/27/23 1800  BP: 139/72 (!) 147/83 135/84 (!) 139/100  Pulse: 76 86 96 86  Resp: (!) 21 18 (!) 22 19  Temp: (!) 101.3 F (38.5 C) (!) 102.2 F (39 C) (!) 102 F (38.9 C) (!) 101.5 F (38.6 C)  TempSrc:      SpO2: 100% 98% 95% 98%  Weight:      Height:       Filed Weights   04/23/23 1802 04/26/23 0500 04/27/23 0500  Weight: 187 lb (84.8 kg) 184 lb 1.4 oz (83.5 kg) 188 lb 7.9 oz (85.5 kg)   .Body mass index is 29.52 kg/m.  GENERAL:awake and alert and verbal NECK: supple, no JVD, thyroid normal size, non-tender, without nodularity LYMPH:  no palpable lymphadenopathy in the cervical, axillary or inguinal LUNGS: clear to auscultation with normal respiratory effort HEART: regular rate & rhythm,  no murmurs and no lower extremity edema ABDOMEN: abdomen soft, non-tender, normoactive bowel sounds  Neuro - alert, no overt focal motor deficitis  MEDICAL HISTORY:  Past Medical History:  Diagnosis Date   Cancer (HCC)    prostate   Follicular lymphoma (HCC) 2024   History of pulmonary embolism    Hypertension    Myelodysplastic syndrome (HCC)    PAF (paroxysmal atrial fibrillation) (HCC)    Recurrent UTI     SURGICAL HISTORY: Past Surgical History:  Procedure Laterality Date   APPLICATION OF CRANIAL NAVIGATION Right 03/10/2023   Procedure: APPLICATION OF CRANIAL NAVIGATION;   Surgeon: Tressie Stalker, MD;  Location: [facility] OR;  Service: Neurosurgery;  Laterality: Right;   BIOPSY  08/21/2022   Procedure: BIOPSY;  Surgeon: Beverley Fiedler, MD;  Location: [facility] ENDOSCOPY;  Service: Gastroenterology;;   CRANIOTOMY Right 03/10/2023   Procedure: PARIETAL CRANIOTOMY;  Surgeon: Tressie Stalker, MD;  Location: [facility] OR;  Service: Neurosurgery;  Laterality: Right;   ESOPHAGOGASTRODUODENOSCOPY (EGD) WITH PROPOFOL N/A 08/21/2022   Procedure: ESOPHAGOGASTRODUODENOSCOPY (EGD) WITH PROPOFOL;  Surgeon: Beverley Fiedler, MD;  Location: [facility] ENDOSCOPY;  Service: Gastroenterology;  Laterality: N/A;   IR IMAGING GUIDED PORT INSERTION  08/26/2022   LYMPHADENECTOMY Bilateral 09/01/2019   Procedure: LYMPHADENECTOMY;  Surgeon: Sebastian Ache, MD;  Location: [facility] ORS;  Service: Urology;  Laterality: Bilateral;   ROBOT ASSISTED LAPAROSCOPIC RADICAL PROSTATECTOMY N/A 09/01/2019   Procedure: XI ROBOTIC ASSISTED LAPAROSCOPIC RADICAL PROSTATECTOMY;  Surgeon: Sebastian Ache, MD;  Location: [facility] ORS;  Service: Urology;  Laterality: N/A;  3 HRS    SOCIAL HISTORY: Social History   Socioeconomic History   Marital status: Married    Spouse name: Not on file   Number of children: Not on file   Years of education: Not on file   Highest education level: Not on file  Occupational History   Not on file  Tobacco Use   Smoking status: Never   Smokeless tobacco: Never  Vaping Use   Vaping status: Never Used  Substance and Sexual Activity   Alcohol use: Yes  Comment: at least one 40 oz beer daily   Drug use: Never   Sexual activity: Not on file  Other Topics Concern   Not on file  Social History Narrative   Not on file   Social Drivers of Health   Financial Resource Strain: Not on File (06/14/2021)   Received from [facility], [place]   Financial Resource Strain    Financial Resource Strain: 0  Food Insecurity: No Food Insecurity (04/22/2023)   Hunger Vital Sign    Worried About Running Out of Food in the Last  Year: Never true    Ran Out of Food in the Last Year: Never true  Transportation Needs: No Transportation Needs (04/22/2023)   PRAPARE - Administrator, Civil Service (Medical): No    Lack of Transportation (Non-Medical): No  Physical Activity: Not on File (06/14/2021)   Received from [place], [place]   Physical Activity    Physical Activity: 0  Stress: Not on File (06/14/2021)   Received from [facility], [place]   Stress    Stress: 0  Social Connections: Patient Declined (04/22/2023)   Social Connection and Isolation Panel [NHANES]    Frequency of Communication with Friends and Family: Patient declined    Frequency of Social Gatherings with Friends and Family: Patient declined    Attends Religious Services: Patient declined    Database administrator or Organizations: Patient declined    Attends Banker Meetings: Patient declined    Marital Status: Patient declined  Recent Concern: Social Connections - Moderately Isolated (04/07/2023)   Social Connection and Isolation Panel [NHANES]    Frequency of Communication with Friends and Family: More than three times a week    Frequency of Social Gatherings with Friends and Family: Once a week    Attends Religious Services: Never    Database administrator or Organizations: No    Attends Banker Meetings: Never    Marital Status: Married  Catering manager Violence: Not At Risk (04/22/2023)   Humiliation, Afraid, Rape, and Kick questionnaire    Fear of Current or Ex-Partner: No    Emotionally Abused: No    Physically Abused: No    Sexually Abused: No    FAMILY HISTORY: Family History  Problem Relation Age of Onset   Stroke Father     ALLERGIES:  is allergic to amitriptyline, amlodipine, and neurontin [gabapentin].  MEDICATIONS:  Scheduled Meds:  Chlorhexidine Gluconate Cloth  6 each Topical Nightly   cholecalciferol  2,000 Units Oral Daily   enoxaparin (LOVENOX) injection  40 mg Subcutaneous Q24H    famotidine  20 mg Per Tube QHS   insulin aspart  0-15 Units Subcutaneous Q4H   levETIRAcetam  1,000 mg Per Tube BID   mouth rinse  15 mL Mouth Rinse Q2H   pantoprazole (PROTONIX) IV  40 mg Intravenous Q24H   scopolamine  1 patch Transdermal Q72H   sodium chloride flush  10-40 mL Intracatheter Q12H   tenofovir alafenamide  25 mg Oral Daily   Continuous Infusions:  sodium chloride Stopped (04/25/23 2019)   dextrose 5% lactated ringers Stopped (04/27/23 1444)   ondansetron (ZOFRAN) IV     PRN Meds:.acetaminophen, alum & mag hydroxide-simeth, hydrALAZINE, hydrocortisone, ibuprofen, lidocaine, magic mouthwash w/lidocaine, ondansetron **OR** ondansetron **OR** ondansetron (ZOFRAN) IV **OR** ondansetron (ZOFRAN) IV, mouth rinse, polyethylene glycol, senna-docusate, sodium chloride flush  REVIEW OF SYSTEMS:    10 Point review of Systems was done is negative except as noted above.  LABORATORY DATA:  I have reviewed the data as listed  .    Latest Ref Rng & Units 04/27/2023    4:32 AM 04/26/2023    5:00 AM 04/25/2023   10:45 PM  CBC  WBC 4.0 - 10.5 K/uL 4.3  4.6  4.9   Hemoglobin 13.0 - 17.0 g/dL 60.4  54.0  98.1   Hematocrit 39.0 - 52.0 % 38.3  40.0  40.5   Platelets 150 - 400 K/uL 140  149  142     .    Latest Ref Rng & Units 04/27/2023    4:32 AM 04/26/2023    5:00 AM 04/25/2023   10:45 PM  CMP  Glucose 70 - 99 mg/dL 191  478  295   BUN 8 - 23 mg/dL 26  21  21    Creatinine 0.61 - 1.24 mg/dL 6.21  3.08  6.57   Sodium 135 - 145 mmol/L 140  138  139   Potassium 3.5 - 5.1 mmol/L 3.6  3.3  3.6   Chloride 98 - 111 mmol/L 104  97  93   CO2 22 - 32 mmol/L 28  30  34   Calcium 8.9 - 10.3 mg/dL 8.0  8.0  8.3   Total Protein 6.5 - 8.1 g/dL 4.9  5.2  6.0   Total Bilirubin 0.0 - 1.2 mg/dL 0.9  1.3  1.1   Alkaline Phos 38 - 126 U/L 54  61  68   AST 15 - 41 U/L 20  29  34   ALT 0 - 44 U/L 33  44  49      RADIOGRAPHIC STUDIES: I have personally reviewed the radiological images as listed  and agreed with the findings in the report. DG CHEST PORT 1 VIEW Result Date: 04/27/2023 CLINICAL DATA:  Acute hypoxemic respiratory failure. EXAM: PORTABLE CHEST 1 VIEW COMPARISON:  Chest radiograph dated 04/26/2023. FINDINGS: The heart is enlarged. An endotracheal tube terminates in the midthoracic trachea. An enteric tube enters the stomach and terminates below the field of view. A right internal jugular central venous port catheter tip overlies the right atrium. There is mild bibasilar atelectasis/airspace disease, similar to prior exam. IMPRESSION: Mild bibasilar atelectasis/airspace disease, similar to prior exam. Electronically Signed   By: Romona Curls M.D.   On: 04/27/2023 14:57   ECHOCARDIOGRAM COMPLETE Result Date: 04/26/2023    ECHOCARDIOGRAM REPORT   Patient Name:   JASMOND RIVER Date of Exam: 04/26/2023 Medical Rec #:  [id number]    Height:       67.0 in Accession #:    [id number]   Weight:       184.1 lb Date of Birth:  1950/03/11    BSA:          1.952 m Patient Age:    72 years     BP:           151/88 mmHg Patient Gender: M            HR:           48 bpm. Exam Location:  Inpatient Procedure: 2D Echo, Color Doppler and Cardiac Doppler (Both Spectral and Color            Flow Doppler were utilized during procedure). Indications:    Resp Distress  History:        Patient has prior history of Echocardiogram examinations, most  recent 10/03/2022. Chemo; Risk Factors:Hypertension.  Sonographer:    Amy Chionchio Referring Phys: [id number] MURALI RAMASWAMY IMPRESSIONS  1. Left ventricular ejection fraction, by estimation, is 60 to 65%. The left ventricle has normal function. The left ventricle has no regional wall motion abnormalities. There is mild left ventricular hypertrophy. Left ventricular diastolic parameters are indeterminate.  2. Right ventricular systolic function is normal. The right ventricular size is normal. Tricuspid regurgitation signal is inadequate for assessing PA pressure.  3.  The mitral valve is normal in structure. Trivial mitral valve regurgitation. No evidence of mitral stenosis.  4. The aortic valve is tricuspid. Aortic valve regurgitation is not visualized. No aortic stenosis is present.  5. The inferior vena cava is dilated in size with <50% respiratory variability, suggesting right atrial pressure of 15 mmHg. FINDINGS  Left Ventricle: Left ventricular ejection fraction, by estimation, is 60 to 65%. The left ventricle has normal function. The left ventricle has no regional wall motion abnormalities. Strain imaging was not performed. The left ventricular internal cavity  size was normal in size. There is mild left ventricular hypertrophy. Left ventricular diastolic parameters are indeterminate. Right Ventricle: The right ventricular size is normal. No increase in right ventricular wall thickness. Right ventricular systolic function is normal. Tricuspid regurgitation signal is inadequate for assessing PA pressure. Left Atrium: Left atrial size was normal in size. Right Atrium: Right atrial size was normal in size. Pericardium: There is no evidence of pericardial effusion. Mitral Valve: The mitral valve is normal in structure. Trivial mitral valve regurgitation. No evidence of mitral valve stenosis. MV peak gradient, 2.4 mmHg. The mean mitral valve gradient is 1.0 mmHg. Tricuspid Valve: The tricuspid valve is normal in structure. Tricuspid valve regurgitation is trivial. Aortic Valve: The aortic valve is tricuspid. Aortic valve regurgitation is not visualized. No aortic stenosis is present. Aortic valve mean gradient measures 3.0 mmHg. Aortic valve peak gradient measures 5.7 mmHg. Aortic valve area, by VTI measures 2.11 cm. Pulmonic Valve: The pulmonic valve was grossly normal. Pulmonic valve regurgitation is not visualized. Aorta: The aortic root and ascending aorta are structurally normal, with no evidence of dilitation. Venous: The inferior vena cava is dilated in size with less  than 50% respiratory variability, suggesting right atrial pressure of 15 mmHg. IAS/Shunts: The interatrial septum was not well visualized. Additional Comments: 3D imaging was not performed.  LEFT VENTRICLE PLAX 2D LVIDd:         4.20 cm     Diastology LVIDs:         2.70 cm     LV e' medial:    5.11 cm/s LV PW:         1.10 cm     LV E/e' medial:  14.0 LV IVS:        0.90 cm     LV e' lateral:   7.94 cm/s LVOT diam:     2.20 cm     LV E/e' lateral: 9.0 LV SV:         58 LV SV Index:   30 LVOT Area:     3.80 cm  LV Volumes (MOD) LV vol d, MOD A2C: 69.9 ml LV vol d, MOD A4C: 90.8 ml LV vol s, MOD A2C: 23.8 ml LV vol s, MOD A4C: 29.9 ml LV SV MOD A2C:     46.1 ml LV SV MOD A4C:     90.8 ml LV SV MOD BP:      53.6 ml RIGHT VENTRICLE  IVC RV Basal diam:  3.10 cm     IVC diam: 2.20 cm RV S prime:     14.90 cm/s TAPSE (M-mode): 1.7 cm LEFT ATRIUM             Index        RIGHT ATRIUM          Index LA Vol (A2C):   31.3 ml 16.03 ml/m  RA Area:     9.52 cm LA Vol (A4C):   54.0 ml 27.66 ml/m  RA Volume:   14.30 ml 7.32 ml/m LA Biplane Vol: 43.6 ml 22.33 ml/m  AORTIC VALVE                    PULMONIC VALVE AV Area (Vmax):    2.48 cm     PV Vmax:       0.77 m/s AV Area (Vmean):   2.33 cm     PV Peak grad:  2.4 mmHg AV Area (VTI):     2.11 cm AV Vmax:           119.00 cm/s AV Vmean:          78.400 cm/s AV VTI:            0.275 m AV Peak Grad:      5.7 mmHg AV Mean Grad:      3.0 mmHg LVOT Vmax:         77.50 cm/s LVOT Vmean:        48.100 cm/s LVOT VTI:          0.153 m LVOT/AV VTI ratio: 0.56  AORTA Ao Root diam: 3.30 cm Ao Asc diam:  3.40 cm MITRAL VALVE MV Area (PHT): 2.70 cm    SHUNTS MV Area VTI:   2.10 cm    Systemic VTI:  0.15 m MV Peak grad:  2.4 mmHg    Systemic Diam: 2.20 cm MV Mean grad:  1.0 mmHg MV Vmax:       0.77 m/s MV Vmean:      35.3 cm/s MV Decel Time: 281 msec MV E velocity: 71.70 cm/s MV A velocity: 83.10 cm/s MV E/A ratio:  0.86 Epifanio Lesches MD Electronically signed by  Epifanio Lesches MD Signature Date/Time: 04/26/2023/2:18:08 PM    Final    DG CHEST PORT 1 VIEW Result Date: 04/26/2023 CLINICAL DATA:  Endotracheal tube present. EXAM: PORTABLE CHEST 1 VIEW COMPARISON:  Radiograph yesterday.  CT earlier today FINDINGS: Endotracheal tube tip 4.6 cm from the carina. Weighted enteric tube tip below the diaphragm not included in the field of view. Right chest port unchanged in position. Stable heart size and mediastinal contours. Left lung base opacity has progressed from yesterday's radiograph, subtotal lower lobe collapse on CT. Additional streaky atelectasis in the left mid lung. Right lower lobe opacity on CT has no definite radiographic correlate. No pneumothorax or large pleural effusion. IMPRESSION: 1. Endotracheal tube tip 4.6 cm from the carina. 2. Left lung base opacity has progressed from yesterday's radiograph, subtotal lower lobe collapse on this morning's CT. Additional streaky atelectasis in the left mid lung. Electronically Signed   By: Narda Rutherford M.D.   On: 04/26/2023 13:27   CT CHEST W CONTRAST Result Date: 04/26/2023 CLINICAL DATA:  Head neck cancer.  Airway compromise with intubation EXAM: CT CHEST WITH CONTRAST TECHNIQUE: Multidetector CT imaging of the chest was performed during intravenous contrast administration. RADIATION DOSE REDUCTION: This exam was performed according to the departmental dose-optimization program  which includes automated exposure control, adjustment of the mA and/or kV according to patient size and/or use of iterative reconstruction technique. CONTRAST:  OMNIPAQUE IOHEXOL 300 MG/ML  SOLN COMPARISON:  Head CT 04/16/2023 FINDINGS: Cardiovascular: Normal heart size. No pericardial effusion. Atheromatous calcification of the coronary arteries. Porta catheter with tip at the upper right atrium. Mediastinum/Nodes: Located endotracheal and enteric tube. No mass or adenopathy. Lungs/Pleura: Multi segment opacification in the lower  lobes with volume loss, worse on the left. No consolidation, edema, effusion, or pneumothorax. Upper Abdomen: Enteric tube with tip at the pylorus. No acute finding Musculoskeletal: Thoracic spondylosis.  No acute finding IMPRESSION: Multi segment atelectasis in the lower lobes Electronically Signed   By: Tiburcio Pea M.D.   On: 04/26/2023 06:31   CT HEAD WO CONTRAST ( ) Result Date: 04/26/2023 CLINICAL DATA:  Mental status change.  History of CNS lymphoma. EXAM: CT HEAD WITHOUT CONTRAST TECHNIQUE: Contiguous axial images were obtained from the base of the skull through the vertex without intravenous contrast. RADIATION DOSE REDUCTION: This exam was performed according to the departmental dose-optimization program which includes automated exposure control, adjustment of the mA and/or kV according to patient size and/or use of iterative reconstruction technique. COMPARISON:  Brain MRI 04/24/2023 FINDINGS: Brain: Cortical edema at the right insula correlating with abnormality on prior brain MRI. Small area of blood products in the parasagittal right parietal lobe with adjacent swelling, operative site. No evidence of interval infarct, hydrocephalus, or collection. Vascular: No hyperdense vessel or unexpected calcification. Skull: Unremarkable right parietal craniotomy site Sinuses/Orbits: Negative IMPRESSION: No acute finding when compared to 04/24/2023 brain MRI. Electronically Signed   By: Tiburcio Pea M.D.   On: 04/26/2023 05:09   CT SOFT TISSUE NECK W CONTRAST Result Date: 04/26/2023 CLINICAL DATA:  Soft tissue infection suspected. Neck swelling and airway compromise EXAM: CT NECK WITH CONTRAST TECHNIQUE: Multidetector CT imaging of the neck was performed using the standard protocol following the bolus administration of intravenous contrast. RADIATION DOSE REDUCTION: This exam was performed according to the departmental dose-optimization program which includes automated exposure control, adjustment of  the mA and/or kV according to patient size and/or use of iterative reconstruction technique. CONTRAST:  OMNIPAQUE IOHEXOL 300 MG/ML  SOLN COMPARISON:  04/16/2023 head CT FINDINGS: Pharynx and larynx: The airway is collapsed around endotracheal tube and enteric tube with fluid layering in the nasopharynx. This limits assessment of the mucosal surfaces, no evidence of submucosal edema. Salivary glands: No inflammation, mass, or stone. Thyroid: Normal. Lymph nodes: None enlarged or heterogeneous. Vascular: No acute finding Limited intracranial: Negative Visualized orbits: Negative Mastoids and visualized paranasal sinuses: Clear Skeleton: No acute or aggressive finding. Upper chest: Clear apical lungs. IMPRESSION: No acute finding. Assessment of mucosal surfaces limited by airway collapse in the setting of intubation. Electronically Signed   By: Tiburcio Pea M.D.   On: 04/26/2023 05:03   DG Abd 1 View Result Date: 04/26/2023 CLINICAL DATA:  Check gastric catheter placement EXAM: ABDOMEN - 1 VIEW COMPARISON:  None Available. FINDINGS: Weighted feeding catheter is noted in the distal stomach directed towards the pole or is. No free air is seen. No obstructive changes are noted. IMPRESSION: Feeding catheter as described. Electronically Signed   By: Alcide Clever M.D.   On: 04/26/2023 02:11   DG CHEST PORT 1 VIEW Result Date: 04/25/2023 CLINICAL DATA:  Check endotracheal tube placement EXAM: PORTABLE CHEST 1 VIEW COMPARISON:  Film from earlier in the same day. FINDINGS: Endotracheal tube is noted  5.3 cm above the carina. Gastric catheter extends into the stomach. Right chest wall port is again seen. Cardiac shadow is stable. No focal infiltrate is noted. Mild basilar atelectasis is seen. IMPRESSION: Tubes and lines as described. Electronically Signed   By: Alcide Clever M.D.   On: 04/25/2023 21:44   DG Chest Port 1 View Result Date: 04/25/2023 CLINICAL DATA:  Altered mental status EXAM: PORTABLE CHEST 1 VIEW  COMPARISON:  04/10/2023 FINDINGS: Cardiac shadow is enlarged but stable. Right chest wall port is again seen. Mild basilar atelectasis is seen. No bony abnormality is noted. IMPRESSION: Mild basilar atelectasis. Electronically Signed   By: Alcide Clever M.D.   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 History of B-cell lymphoma EXAM: MRI HEAD WITHOUT AND WITH CONTRAST TECHNIQUE: Multiplanar, multiecho pulse sequences of the brain and surrounding structures were obtained without and with intravenous contrast. CONTRAST:  8mL GADAVIST GADOBUTROL 1 MMOL/ML IV SOLN COMPARISON:  Comparison made with previous MRI from 04/07/2023 as well as earlier studies. FINDINGS: Brain: Postoperative changes from prior right parietal craniotomy again seen. 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 Size of the lesion is markedly decreased from prior, now measuring 2.9 x 3.5 x 2.3 cm (series 18, image 10). Superimposed susceptibility artifact consistent with blood products. Persistent but improved vasogenic edema within the adjacent posterior right cerebral hemisphere, with improved/resolved mass effect on the right lateral ventricle. Previously seen right to left shift has resolved. Findings consistent with interval response to therapy. Now seen is restricted diffusion involving the right insular cortex and overlying posterior right frontal region (series 5, images 26, 30). Associated gyral swelling with T2/FLAIR signal abnormality within this location (series 9, image 28). No associated enhancement. 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 No other mass lesion or abnormal enhancement. No other acute or subacute infarct. No hydrocephalus or extra-axial fluid collection. Underlying cerebral white matter disease noted, stable. Vascular: Major intracranial vascular flow voids  are maintained. Skull and upper cervical spine: Craniocervical junction within normal limits. Bone marrow signal intensity within normal limits. Post craniotomy changes at the right parietal calvarium without adverse features. Sinuses/Orbits: Globes and orbital soft tissues within normal limits. Mild mucosal thickening present about the ethmoidal air cells and maxillary sinuses. No mastoid effusion. Other: None. IMPRESSION: 1. Marked interval decrease in size of right parietal mass/lymphoma, now measuring 2.9 x 3.5 x 2.3 cm (previously 5.4 x 3.6 x 4.4 cm), consistent with interval response to therapy. Persistent but improved vasogenic edema within the adjacent posterior right cerebral hemisphere, with interval resolution of previously seen right-to-left midline shift. 2. 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 Correlation with symptomatology and EEG recommended. Evolving changes of subacute ischemia would be the primary differential consideration, but are felt to be less likely. Electronically Signed   By: Rise Mu M.D.   On: 04/25/2023 05:24   DG CHEST PORT 1 VIEW Result Date: 04/10/2023 CLINICAL DATA:  Shortness of breath.  History of lymphoma EXAM: PORTABLE CHEST 1 VIEW COMPARISON:  X-ray 08/11/2022.  PET-CT 12/27/2022 FINDINGS: Right IJ chest port with tip overlying the right atrium. The port is accessed. Widening of the mediastinum is again seen. Enlarged cardiac silhouette. Basilar atelectasis or scar. No pneumothorax. Film is underinflated. No edema or effusion. Degenerative changes of the spine  IMPRESSION: Underinflation.  Basilar atelectasis or scar. Widened mediastinum. Similar previous. Please correlate with prior PET-CT 12/27/2022 demonstrating prominent mediastinal fat. Chest port Electronically Signed   By: Karen Kays M.D.   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 EEG to evaluate for seizure Level of alertness: Awake, asleep AEDs during EEG study: None Technical aspects: This EEG study was done with scalp electrodes positioned according to the 10-20 International system of electrode placement. Electrical activity was reviewed with band pass filter of 1-70Hz , sensitivity of 7 uV/mm, display speed of 2mm/sec with a 60Hz  notched filter applied as appropriate. EEG data were recorded continuously and digitally stored.  Video monitoring was available and reviewed as appropriate. Description: The posterior dominant rhythm consists of 8-9 Hz activity of moderate voltage (25-35 uV) seen predominantly in posterior head regions, symmetric and reactive to eye opening and eye closing. Sleep was characterized by vertex waves, sleep spindles (12 to 14 Hz), maximal frontocentral region. EEG showed continuous sharply contoured 3 to 6 Hz theta-delta slowing in right parietal region consistent with breach artifact. Sharp waves were noted in right parietal region. Hyperventilation and photic stimulation were not performed.   ABNORMALITY - Sharp wave, right parietal region - Continuous slow,  right parietal region IMPRESSION: This study showed evidence of epileptogenicity arising from right parietal region. Additionally there is cortical dysfunction arising from right parietal region consistent with underlying craniotomy. No seizures were seen throughout the recording. Charlsie Quest   MR Brain W and Wo Contrast Result Date: 04/07/2023 CLINICAL DATA:  Provided history: Neuro deficit, persistent/recurrent, CNS neoplasm suspected. Diffuse  large B-cell lymphoma. Left-sided weakness with midline shift on CT. EXAM: MRI HEAD WITHOUT AND WITH CONTRAST TECHNIQUE: Multiplanar, multiecho pulse sequences of the brain and surrounding structures were obtained without and with intravenous contrast. CONTRAST:  8mL GADAVIST GADOBUTROL 1 MMOL/ML IV SOLN COMPARISON:  Head CT 04/07/2023.  Brain MRI 03/07/2023 neck FINDINGS: Intermittently motion degraded examination. Most notably, the axial T1 post-contrast sequence and coronal T2 sequence are moderately motion degraded. Within this limitation findings are as follows. Brain: Since the prior MRI 06/05/2023, there has been right parietal craniotomy for biopsy/debulking of an enhancing right parietal lobe tumor. Non-acute blood products at the operative site. The residual enhancing tumor measures 5.4 x 3.6 x 4.4 cm (previously 4.4 x 3.0 x 3.3 when remeasured on prior). Prominent surrounding edema slightly increased. 6 mm leftward midline shift has also increased (previously 4 mm). Persistent effacement of portions of the right lateral ventricle. Persistent asymmetric prominence of the right lateral ventricle temporal horn consistent with ventricular entrapment. Trace postoperative subdural collection along the anterior right cerebral hemisphere. Mild-to-moderate multifocal T2 FLAIR hyperintense signal abnormality elsewhere within the cerebral white matter, nonspecific but compatible with chronic small vessel ischemic disease. There is no acute infarct. Vascular: Maintained flow voids within the proximal large arterial vessels. Skull and upper cervical spine: Right parietal cranioplasty. No focal worrisome marrow lesion. Sinuses/orbits: No orbital mass or acute orbital finding.Mild mucosal thickening within the left maxillary sinus. IMPRESSION: 1. Motion degraded examination. 2. Since the prior brain MRI 03/07/2023, there has been right parietal craniotomy for biopsy/debulking of an enhancing right parietal  lobe tumor.  The residual tumor has increased in size, now measuring 5.4 x 3.6 x 4.4 cm (previously 4.4 x 3.0 x 3.3 cm). Prominent surrounding edema has slightly increased. 6 mm leftward midline shift has also increased. Unchanged asymmetric prominence of the right lateral ventricle temporal horn consistent with ventricular entrapment. Electronically Signed   By: Jackey Loge D.O.   On: 04/07/2023 14:01   CT Head Wo Contrast Result Date: 04/07/2023 CLINICAL DATA:  Facial paralysis/weakness (CN 7) EXAM: CT HEAD WITHOUT CONTRAST TECHNIQUE: Contiguous axial images were obtained from the base of the skull through the vertex without intravenous contrast. RADIATION DOSE REDUCTION: This exam was performed according to the departmental dose-optimization program which includes automated exposure control, adjustment of the mA and/or kV according to patient size and/or use of iterative reconstruction technique. COMPARISON:  Head CT 03/06/2023, brain MR 03/07/2023 FINDINGS: Brain: No hemorrhage. No hydrocephalus. No extra-axial fluid collection postsurgical changes from a right parietal craniotomy with interval increase in size of the residual/recurrent tumor at the craniotomy site, now measuring up to 4.7 x 2.7 cm, previously 3.2 x 2.5 cm, when measured in a similar orientation. There is increased leftward midline shift, now measuring up to 6 mm, previously 3 mm. Unchanged disproportionate enlargement of the right temporal horn, worrisome for ventricular entrapment. Vascular: No hyperdense vessel or unexpected calcification. Skull: Normal. Negative for fracture or focal lesion. Status post parietal craniotomy Sinuses/Orbits: No middle ear or mastoid effusion. Mucosal thickening in the bilateral maxillary sinuses, right-greater-than-left orbits are unremarkable. Other: None. IMPRESSION: 1. Postsurgical changes from a right parietal craniotomy with interval increase in size of the residual/recurrent tumor at the craniotomy site, now  measuring up to 4.7 x 2.7 cm, previously 3.2 x 2.5 cm. There is increased leftward midline shift, now measuring up to 6 mm, previously 3 mm. Recommend further evaluation with brain MRI. 2. Unchanged asymmetric enlargement of the right temporal horn, compatible with ventricular entrapment Electronically Signed   By: Lorenza Cambridge M.D.   On: 04/07/2023 10:57    ASSESSMENT & PLAN:   1.  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  Partial sensory seizures, left side -Complaining of left-sided paresthesia which he states causes his fingers to feel numb for a few seconds.   - May be due to partial sensory seizures. Was seen by Neuro, s/p EEG last admission, no seizures seen. - Currently on Keppra 750 mg po bid.  Started on 500 mg during last admission with siginificant improvement in symptoms.  Should continue to improve with treatment for CNS lymphoma. - Will consider repeat MRI brain due to ongoing symptoms - Continue to monitor closely   3.  History of Altered mental status  4. Thrombocytopenia - Mild, likely due to recent chemotherapy treatment - platelets 134k today - No intervention required at this time   5.  Elevated blood glucose  - Monitor blood sugar levels closely. -Primary team following    6.  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  A-fib - S/p Eliquis, d/c'd due to high risk for bleeding  - Follow closely with cardiology   8.  Prostate cancer - Diagnosed 2021   9.  Hep B antibody positive, chronic - On tenofovir for hep B reactivation suppression in context of Rituxan use.  Plan is to continue for 6 months after his last chemotherapy. - Follow ID outpatient  10.  VTE prophylaxis - With Lovenox  PLAN - patient now extubated and breathing comfortably. -no overt motor or sensory seizure but cannot completely r/o ongoing deep brain partial seizures and so moving  to [place] for more details eeG monitoring Methotrexate levels 0.09. -- can hold further leucovorin rescue. -off Bicarb drip. -dexamethasone 4mg  po BID from today -on Keppra 1000mg  [place] tube BID -transferring to [facility] for EEG monitoring to optimize SZ mx -fever -- ? Related to seizure /meds vs infection --- evaluate and mx per PCCM Appreciate help from [facility] team. Case discussed in details with Dr Marchelle Gearing  .The total time spent in the appointment was 35 minutes* .  All of the patient's questions were answered with apparent satisfaction. The patient knows to call the clinic with any problems, questions or concerns.   Wyvonnia Lora MD MS AAHIVMS [facility] Hematology/Oncology Physician [facility]  .*Total Encounter Time as defined by the Centers for Medicare and Medicaid Services includes, in addition to the face-to-face time of a patient visit (documented in the note above) non-face-to-face time: obtaining and reviewing outside history, ordering and reviewing medications, tests or procedures, care coordination (communications with other health care professionals or caregivers) and documentation in the medical record.

## 2023-04-27 NOTE — Progress Notes (Addendum)
   D/w RN housekeepin issues  1) Keep [facility] - ensure order there; 2) No TF. 3) Do swallow in a bit; 4) start D5 LR for CBG 67 5) move to 4N for c-EEG per neuro even if extubated - d/w Dr Lucita Ferrara    Dr. Kalman Shan, M.D., F.C.C.P,  Pulmonary and Critical Care Medicine Staff Physician, [facility] Center Director - Interstitial Lung Disease  Program  Pulmonary Fibrosis [facility] at [facility] [place], [place], [address]   Pager: [phone], If no answer  -> Check AMION or Try [phone] Telephone (clinical office): [phone] Telephone (research): [phone]  12:29 PM 04/27/2023

## 2023-04-27 NOTE — Progress Notes (Signed)
 [name]  HEMATOLOGY/ONCOLOGY INPATIENT PROGRESS NOTE  Date of Service: 04/26/2023  Inpatient Attending: .Kalman Shan, MD   SUBJECTIVE  Patient was seen this AM. Since intubated and on ventilator. Case discussed with Dr Marchelle Gearing. Neurology has recommended increased dose of Keppra. CT head, CT neck/chest reviewed.    OBJECTIVE:  NAD  PHYSICAL EXAMINATION: . Vitals:   04/26/23 1939 04/26/23 2100 04/26/23 2138 04/26/23 2330  BP:  106/61  (!) 106/59  Pulse:   65   Resp:  18 18 18   Temp:  99 F (37.2 C) 99 F (37.2 C) 99.5 F (37.5 C)  TempSrc: Bladder     SpO2:   (!) 87%   Weight:      Height:       Filed Weights   04/23/23 1802 04/26/23 0500  Weight: 187 lb (84.8 kg) 184 lb 1.4 oz (83.5 kg)   .Body mass index is 28.83 kg/m.  GENERAL:sedated , no vent NECK: supple, no JVD, thyroid normal size, non-tender, without nodularity LYMPH:  no palpable lymphadenopathy in the cervical, axillary or inguinal LUNGS: clear to auscultation with normal respiratory effort HEART: regular rate & rhythm,  no murmurs and no lower extremity edema ABDOMEN: abdomen soft, non-tender, normoactive bowel sounds    MEDICAL HISTORY:  Past Medical History:  Diagnosis Date   Cancer (HCC)    prostate   Follicular lymphoma (HCC) 2024   History of pulmonary embolism    Hypertension    Myelodysplastic syndrome (HCC)    PAF (paroxysmal atrial fibrillation) (HCC)    Recurrent UTI     SURGICAL HISTORY: Past Surgical History:  Procedure Laterality Date   APPLICATION OF CRANIAL NAVIGATION Right 03/10/2023   Procedure: APPLICATION OF CRANIAL NAVIGATION;  Surgeon: Tressie Stalker, MD;  Location: [facility] OR;  Service: Neurosurgery;  Laterality: Right;   BIOPSY  08/21/2022   Procedure: BIOPSY;  Surgeon: Beverley Fiedler, MD;  Location: [facility] ENDOSCOPY;  Service: Gastroenterology;;   CRANIOTOMY Right 03/10/2023   Procedure: PARIETAL CRANIOTOMY;  Surgeon: Tressie Stalker, MD;  Location: [facility] OR;  Service:  Neurosurgery;  Laterality: Right;   ESOPHAGOGASTRODUODENOSCOPY (EGD) WITH PROPOFOL N/A 08/21/2022   Procedure: ESOPHAGOGASTRODUODENOSCOPY (EGD) WITH PROPOFOL;  Surgeon: Beverley Fiedler, MD;  Location: [facility] ENDOSCOPY;  Service: Gastroenterology;  Laterality: N/A;   IR IMAGING GUIDED PORT INSERTION  08/26/2022   LYMPHADENECTOMY Bilateral 09/01/2019   Procedure: LYMPHADENECTOMY;  Surgeon: Sebastian Ache, MD;  Location: [facility] ORS;  Service: Urology;  Laterality: Bilateral;   ROBOT ASSISTED LAPAROSCOPIC RADICAL PROSTATECTOMY N/A 09/01/2019   Procedure: XI ROBOTIC ASSISTED LAPAROSCOPIC RADICAL PROSTATECTOMY;  Surgeon: Sebastian Ache, MD;  Location: [facility] ORS;  Service: Urology;  Laterality: N/A;  3 HRS    SOCIAL HISTORY: Social History   Socioeconomic History   Marital status: Married    Spouse name: Not on file   Number of children: Not on file   Years of education: Not on file   Highest education level: Not on file  Occupational History   Not on file  Tobacco Use   Smoking status: Never   Smokeless tobacco: Never  Vaping Use   Vaping status: Never Used  Substance and Sexual Activity   Alcohol use: Yes    Comment: at least one 40 oz beer daily   Drug use: Never   Sexual activity: Not on file  Other Topics Concern   Not on file  Social History Narrative   Not on file   Social Drivers of Health   Financial Resource Strain: Not on  File (06/14/2021)   Received from [facility] [employer]    Financial Resource Strain: 0  Food Insecurity: No Food Insecurity (04/22/2023)   Hunger Vital Sign    Worried About Running Out of Food in the Last Year: Never true    Ran Out of Food in the Last Year: Never true  Transportation Needs: No Transportation Needs (04/22/2023)   PRAPARE - Administrator, Civil Service (Medical): No    Lack of Transportation (Non-Medical): No  Physical Activity: Not on File (06/14/2021)   Received from [place], [place]   Physical Activity     Physical Activity: 0  Stress: Not on File (06/14/2021)   Received from [facility], [place]   Stress    Stress: 0  Social Connections: Patient Declined (04/22/2023)   Social Connection and Isolation Panel [NHANES]    Frequency of Communication with Friends and Family: Patient declined    Frequency of Social Gatherings with Friends and Family: Patient declined    Attends Religious Services: Patient declined    Database administrator or Organizations: Patient declined    Attends Banker Meetings: Patient declined    Marital Status: Patient declined  Recent Concern: Social Connections - Moderately Isolated (04/07/2023)   Social Connection and Isolation Panel [NHANES]    Frequency of Communication with Friends and Family: More than three times a week    Frequency of Social Gatherings with Friends and Family: Once a week    Attends Religious Services: Never    Database administrator or Organizations: No    Attends Banker Meetings: Never    Marital Status: Married  Catering manager Violence: Not At Risk (04/22/2023)   Humiliation, Afraid, Rape, and Kick questionnaire    Fear of Current or Ex-Partner: No    Emotionally Abused: No    Physically Abused: No    Sexually Abused: No    FAMILY HISTORY: Family History  Problem Relation Age of Onset   Stroke Father     ALLERGIES:  is allergic to amitriptyline, amlodipine, and neurontin [gabapentin].  MEDICATIONS:  Scheduled Meds:  Chlorhexidine Gluconate Cloth  6 each Topical Nightly   cholecalciferol  2,000 Units Oral Daily   docusate  100 mg Per Tube BID   enoxaparin (LOVENOX) injection  40 mg Subcutaneous Q24H   famotidine  20 mg Per Tube QHS   feeding supplement (PROSource TF20)  60 mL Per Tube BID   feeding supplement (VITAL 1.5 CAL)  1,000 mL Per Tube Q24H   fentaNYL (SUBLIMAZE) injection  25 mcg Intravenous Once   insulin aspart  0-15 Units Subcutaneous Q4H   leucovorin  16 mg Intravenous Q6H   levETIRAcetam   1,000 mg Per Tube BID   mouth rinse  15 mL Mouth Rinse Q2H   pantoprazole (PROTONIX) IV  40 mg Intravenous Q24H   polyethylene glycol  17 g Per Tube Daily   scopolamine  1 patch Transdermal Q72H   sodium chloride flush  10-40 mL Intracatheter Q12H   tenofovir alafenamide  25 mg Oral Daily   Continuous Infusions:  sodium chloride Stopped (04/25/23 2019)   fentaNYL infusion INTRAVENOUS 100 mcg/hr (04/26/23 2339)   lactated ringers 75 mL/hr at 04/26/23 2339   ondansetron (ZOFRAN) IV     propofol (DIPRIVAN) infusion 50 mcg/kg/min (04/27/23 0005)   sodium bicarbonate 150 mEq in dextrose 5 % 1,150 mL infusion Stopped (04/25/23 1950)   PRN Meds:.acetaminophen, alum & mag hydroxide-simeth, fentaNYL, guaiFENesin-dextromethorphan, hydrALAZINE,  hydrocortisone, lidocaine, magic mouthwash w/lidocaine, midazolam, ondansetron **OR** ondansetron **OR** ondansetron (ZOFRAN) IV **OR** ondansetron (ZOFRAN) IV, mouth rinse, polyethylene glycol, senna-docusate, sodium chloride flush  REVIEW OF SYSTEMS:    10 Point review of Systems was done is negative except as noted above.   LABORATORY DATA:  I have reviewed the data as listed  .    Latest Ref Rng & Units 04/26/2023    5:00 AM 04/25/2023   10:45 PM 04/25/2023    5:28 AM  CBC  WBC 4.0 - 10.5 K/uL 4.6  4.9  7.7   Hemoglobin 13.0 - 17.0 g/dL 62.9  52.8  41.3   Hematocrit 39.0 - 52.0 % 40.0  40.5  39.6   Platelets 150 - 400 K/uL 149  142  134     .    Latest Ref Rng & Units 04/26/2023    5:00 AM 04/25/2023   10:45 PM 04/25/2023    5:28 AM  CMP  Glucose 70 - 99 mg/dL 244  010  272   BUN 8 - 23 mg/dL 21  21  24    Creatinine 0.61 - 1.24 mg/dL 5.36  6.44  0.34   Sodium 135 - 145 mmol/L 138  139  138   Potassium 3.5 - 5.1 mmol/L 3.3  3.6  3.4   Chloride 98 - 111 mmol/L 97  93  94   CO2 22 - 32 mmol/L 30  34  36   Calcium 8.9 - 10.3 mg/dL 8.0  8.3  8.1   Total Protein 6.5 - 8.1 g/dL 5.2  6.0  5.0   Total Bilirubin 0.0 - 1.2 mg/dL 1.3  1.1  1.0    Alkaline Phos 38 - 126 U/L 61  68  69   AST 15 - 41 U/L 29  34  26   ALT 0 - 44 U/L 44  49  49      RADIOGRAPHIC STUDIES: I have personally reviewed the radiological images as listed and agreed with the findings in the report. ECHOCARDIOGRAM COMPLETE Result Date: 04/26/2023    ECHOCARDIOGRAM REPORT   Patient Name:   GARRISON MICHIE Date of Exam: 04/26/2023 Medical Rec #:  [id number]    Height:       67.0 in Accession #:    [id number]   Weight:       184.1 lb Date of Birth:  1950-11-23    BSA:          1.952 m Patient Age:    72 years     BP:           151/88 mmHg Patient Gender: M            HR:           48 bpm. Exam Location:  Inpatient Procedure: 2D Echo, Color Doppler and Cardiac Doppler (Both Spectral and Color            Flow Doppler were utilized during procedure). Indications:    Resp Distress  History:        Patient has prior history of Echocardiogram examinations, most                 recent 10/03/2022. Chemo; Risk Factors:Hypertension.  Sonographer:    Amy Chionchio Referring Phys: [id number] MURALI RAMASWAMY IMPRESSIONS  1. Left ventricular ejection fraction, by estimation, is 60 to 65%. The left ventricle has normal function. The left ventricle has no regional wall motion abnormalities. There is mild left ventricular hypertrophy. Left ventricular  diastolic parameters are indeterminate.  2. Right ventricular systolic function is normal. The right ventricular size is normal. Tricuspid regurgitation signal is inadequate for assessing PA pressure.  3. The mitral valve is normal in structure. Trivial mitral valve regurgitation. No evidence of mitral stenosis.  4. The aortic valve is tricuspid. Aortic valve regurgitation is not visualized. No aortic stenosis is present.  5. The inferior vena cava is dilated in size with <50% respiratory variability, suggesting right atrial pressure of 15 mmHg. FINDINGS  Left Ventricle: Left ventricular ejection fraction, by estimation, is 60 to 65%. The left ventricle has normal  function. The left ventricle has no regional wall motion abnormalities. Strain imaging was not performed. The left ventricular internal cavity  size was normal in size. There is mild left ventricular hypertrophy. Left ventricular diastolic parameters are indeterminate. Right Ventricle: The right ventricular size is normal. No increase in right ventricular wall thickness. Right ventricular systolic function is normal. Tricuspid regurgitation signal is inadequate for assessing PA pressure. Left Atrium: Left atrial size was normal in size. Right Atrium: Right atrial size was normal in size. Pericardium: There is no evidence of pericardial effusion. Mitral Valve: The mitral valve is normal in structure. Trivial mitral valve regurgitation. No evidence of mitral valve stenosis. MV peak gradient, 2.4 mmHg. The mean mitral valve gradient is 1.0 mmHg. Tricuspid Valve: The tricuspid valve is normal in structure. Tricuspid valve regurgitation is trivial. Aortic Valve: The aortic valve is tricuspid. Aortic valve regurgitation is not visualized. No aortic stenosis is present. Aortic valve mean gradient measures 3.0 mmHg. Aortic valve peak gradient measures 5.7 mmHg. Aortic valve area, by VTI measures 2.11 cm. Pulmonic Valve: The pulmonic valve was grossly normal. Pulmonic valve regurgitation is not visualized. Aorta: The aortic root and ascending aorta are structurally normal, with no evidence of dilitation. Venous: The inferior vena cava is dilated in size with less than 50% respiratory variability, suggesting right atrial pressure of 15 mmHg. IAS/Shunts: The interatrial septum was not well visualized. Additional Comments: 3D imaging was not performed.  LEFT VENTRICLE PLAX 2D LVIDd:         4.20 cm     Diastology LVIDs:         2.70 cm     LV e' medial:    5.11 cm/s LV PW:         1.10 cm     LV E/e' medial:  14.0 LV IVS:        0.90 cm     LV e' lateral:   7.94 cm/s LVOT diam:     2.20 cm     LV E/e' lateral: 9.0 LV SV:          58 LV SV Index:   30 LVOT Area:     3.80 cm  LV Volumes (MOD) LV vol d, MOD A2C: 69.9 ml LV vol d, MOD A4C: 90.8 ml LV vol s, MOD A2C: 23.8 ml LV vol s, MOD A4C: 29.9 ml LV SV MOD A2C:     46.1 ml LV SV MOD A4C:     90.8 ml LV SV MOD BP:      53.6 ml RIGHT VENTRICLE             IVC RV Basal diam:  3.10 cm     IVC diam: 2.20 cm RV S prime:     14.90 cm/s TAPSE (M-mode): 1.7 cm LEFT ATRIUM             Index  RIGHT ATRIUM          Index LA Vol (A2C):   31.3 ml 16.03 ml/m  RA Area:     9.52 cm LA Vol (A4C):   54.0 ml 27.66 ml/m  RA Volume:   14.30 ml 7.32 ml/m LA Biplane Vol: 43.6 ml 22.33 ml/m  AORTIC VALVE                    PULMONIC VALVE AV Area (Vmax):    2.48 cm     PV Vmax:       0.77 m/s AV Area (Vmean):   2.33 cm     PV Peak grad:  2.4 mmHg AV Area (VTI):     2.11 cm AV Vmax:           119.00 cm/s AV Vmean:          78.400 cm/s AV VTI:            0.275 m AV Peak Grad:      5.7 mmHg AV Mean Grad:      3.0 mmHg LVOT Vmax:         77.50 cm/s LVOT Vmean:        48.100 cm/s LVOT VTI:          0.153 m LVOT/AV VTI ratio: 0.56  AORTA Ao Root diam: 3.30 cm Ao Asc diam:  3.40 cm MITRAL VALVE MV Area (PHT): 2.70 cm    SHUNTS MV Area VTI:   2.10 cm    Systemic VTI:  0.15 m MV Peak grad:  2.4 mmHg    Systemic Diam: 2.20 cm MV Mean grad:  1.0 mmHg MV Vmax:       0.77 m/s MV Vmean:      35.3 cm/s MV Decel Time: 281 msec MV E velocity: 71.70 cm/s MV A velocity: 83.10 cm/s MV E/A ratio:  0.86 Epifanio Lesches MD Electronically signed by Epifanio Lesches MD Signature Date/Time: 04/26/2023/2:18:08 PM    Final    DG CHEST PORT 1 VIEW Result Date: 04/26/2023 CLINICAL DATA:  Endotracheal tube present. EXAM: PORTABLE CHEST 1 VIEW COMPARISON:  Radiograph yesterday.  CT earlier today FINDINGS: Endotracheal tube tip 4.6 cm from the carina. Weighted enteric tube tip below the diaphragm not included in the field of view. Right chest port unchanged in position. Stable heart size and mediastinal contours. Left  lung base opacity has progressed from yesterday's radiograph, subtotal lower lobe collapse on CT. Additional streaky atelectasis in the left mid lung. Right lower lobe opacity on CT has no definite radiographic correlate. No pneumothorax or large pleural effusion. IMPRESSION: 1. Endotracheal tube tip 4.6 cm from the carina. 2. Left lung base opacity has progressed from yesterday's radiograph, subtotal lower lobe collapse on this morning's CT. Additional streaky atelectasis in the left mid lung. Electronically Signed   By: Narda Rutherford M.D.   On: 04/26/2023 13:27   CT CHEST W CONTRAST Result Date: 04/26/2023 CLINICAL DATA:  Head neck cancer.  Airway compromise with intubation EXAM: CT CHEST WITH CONTRAST TECHNIQUE: Multidetector CT imaging of the chest was performed during intravenous contrast administration. RADIATION DOSE REDUCTION: This exam was performed according to the departmental dose-optimization program which includes automated exposure control, adjustment of the mA and/or kV according to patient size and/or use of iterative reconstruction technique. CONTRAST:  OMNIPAQUE IOHEXOL 300 MG/ML  SOLN COMPARISON:  Head CT 04/16/2023 FINDINGS: Cardiovascular: Normal heart size. No pericardial effusion. Atheromatous calcification of the coronary arteries. Porta  catheter with tip at the upper right atrium. Mediastinum/Nodes: Located endotracheal and enteric tube. No mass or adenopathy. Lungs/Pleura: Multi segment opacification in the lower lobes with volume loss, worse on the left. No consolidation, edema, effusion, or pneumothorax. Upper Abdomen: Enteric tube with tip at the pylorus. No acute finding Musculoskeletal: Thoracic spondylosis.  No acute finding IMPRESSION: Multi segment atelectasis in the lower lobes Electronically Signed   By: Tiburcio Pea M.D.   On: 04/26/2023 06:31   CT HEAD WO CONTRAST ( ) Result Date: 04/26/2023 CLINICAL DATA:  Mental status change.  History of CNS lymphoma. EXAM:  CT HEAD WITHOUT CONTRAST TECHNIQUE: Contiguous axial images were obtained from the base of the skull through the vertex without intravenous contrast. RADIATION DOSE REDUCTION: This exam was performed according to the departmental dose-optimization program which includes automated exposure control, adjustment of the mA and/or kV according to patient size and/or use of iterative reconstruction technique. COMPARISON:  Brain MRI 04/24/2023 FINDINGS: Brain: Cortical edema at the right insula correlating with abnormality on prior brain MRI. Small area of blood products in the parasagittal right parietal lobe with adjacent swelling, operative site. No evidence of interval infarct, hydrocephalus, or collection. Vascular: No hyperdense vessel or unexpected calcification. Skull: Unremarkable right parietal craniotomy site Sinuses/Orbits: Negative IMPRESSION: No acute finding when compared to 04/24/2023 brain MRI. Electronically Signed   By: Tiburcio Pea M.D.   On: 04/26/2023 05:09   CT SOFT TISSUE NECK W CONTRAST Result Date: 04/26/2023 CLINICAL DATA:  Soft tissue infection suspected. Neck swelling and airway compromise EXAM: CT NECK WITH CONTRAST TECHNIQUE: Multidetector CT imaging of the neck was performed using the standard protocol following the bolus administration of intravenous contrast. RADIATION DOSE REDUCTION: This exam was performed according to the departmental dose-optimization program which includes automated exposure control, adjustment of the mA and/or kV according to patient size and/or use of iterative reconstruction technique. CONTRAST:  OMNIPAQUE IOHEXOL 300 MG/ML  SOLN COMPARISON:  04/16/2023 head CT FINDINGS: Pharynx and larynx: The airway is collapsed around endotracheal tube and enteric tube with fluid layering in the nasopharynx. This limits assessment of the mucosal surfaces, no evidence of submucosal edema. Salivary glands: No inflammation, mass, or stone. Thyroid: Normal. Lymph nodes:  None enlarged or heterogeneous. Vascular: No acute finding Limited intracranial: Negative Visualized orbits: Negative Mastoids and visualized paranasal sinuses: Clear Skeleton: No acute or aggressive finding. Upper chest: Clear apical lungs. IMPRESSION: No acute finding. Assessment of mucosal surfaces limited by airway collapse in the setting of intubation. Electronically Signed   By: Tiburcio Pea M.D.   On: 04/26/2023 05:03   DG Abd 1 View Result Date: 04/26/2023 CLINICAL DATA:  Check gastric catheter placement EXAM: ABDOMEN - 1 VIEW COMPARISON:  None Available. FINDINGS: Weighted feeding catheter is noted in the distal stomach directed towards the pole or is. No free air is seen. No obstructive changes are noted. IMPRESSION: Feeding catheter as described. Electronically Signed   By: Alcide Clever M.D.   On: 04/26/2023 02:11   DG CHEST PORT 1 VIEW Result Date: 04/25/2023 CLINICAL DATA:  Check endotracheal tube placement EXAM: PORTABLE CHEST 1 VIEW COMPARISON:  Film from earlier in the same day. FINDINGS: Endotracheal tube is noted 5.3 cm above the carina. Gastric catheter extends into the stomach. Right chest wall port is again seen. Cardiac shadow is stable. No focal infiltrate is noted. Mild basilar atelectasis is seen. IMPRESSION: Tubes and lines as described. Electronically Signed   By: Alcide Clever M.D.   On:  04/25/2023 21:44   DG Chest Port 1 View Result Date: 04/25/2023 CLINICAL DATA:  Altered mental status EXAM: PORTABLE CHEST 1 VIEW COMPARISON:  04/10/2023 FINDINGS: Cardiac shadow is enlarged but stable. Right chest wall port is again seen. Mild basilar atelectasis is seen. No bony abnormality is noted. IMPRESSION: Mild basilar atelectasis. Electronically Signed   By: Alcide Clever M.D.   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 History of B-cell lymphoma EXAM: MRI HEAD WITHOUT AND  WITH CONTRAST TECHNIQUE: Multiplanar, multiecho pulse sequences of the brain and surrounding structures were obtained without and with intravenous contrast. CONTRAST:  8mL GADAVIST GADOBUTROL 1 MMOL/ML IV SOLN COMPARISON:  Comparison made with previous MRI from 04/07/2023 as well as earlier studies. FINDINGS: Brain: Postoperative changes from prior right parietal craniotomy again seen. 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 Size of the lesion is markedly decreased from prior, now measuring 2.9 x 3.5 x 2.3 cm (series 18, image 10). Superimposed susceptibility artifact consistent with blood products. Persistent but improved vasogenic edema within the adjacent posterior right cerebral hemisphere, with improved/resolved mass effect on the right lateral ventricle. Previously seen right to left shift has resolved. Findings consistent with interval response to therapy. Now seen is restricted diffusion involving the right insular cortex and overlying posterior right frontal region (series 5, images 26, 30). Associated gyral swelling with T2/FLAIR signal abnormality within this location (series 9, image 28). No associated enhancement. 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 No other mass lesion or abnormal enhancement. No other acute or subacute infarct. No hydrocephalus or extra-axial fluid collection. Underlying cerebral white matter disease noted, stable. Vascular: Major intracranial vascular flow voids are maintained. Skull and upper cervical spine: Craniocervical junction within normal limits. Bone marrow signal intensity within normal limits. Post craniotomy changes at the right parietal calvarium without adverse features. Sinuses/Orbits: Globes and orbital soft tissues within normal limits. Mild mucosal thickening present about the ethmoidal air cells and maxillary sinuses. No mastoid effusion. Other: None. IMPRESSION: 1. Marked interval decrease in size of right  parietal mass/lymphoma, now measuring 2.9 x 3.5 x 2.3 cm (previously 5.4 x 3.6 x 4.4 cm), consistent with interval response to therapy. Persistent but improved vasogenic edema within the adjacent posterior right cerebral hemisphere, with interval resolution of previously seen right-to-left midline shift. 2. 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 Correlation with symptomatology and EEG recommended. Evolving changes of subacute ischemia would be the primary differential consideration, but are felt to be less likely. Electronically Signed   By: Rise Mu M.D.   On: 04/25/2023 05:24   DG CHEST PORT 1 VIEW Result Date: 04/10/2023 CLINICAL DATA:  Shortness of breath.  History of lymphoma EXAM: PORTABLE CHEST 1 VIEW COMPARISON:  X-ray 08/11/2022.  PET-CT 12/27/2022 FINDINGS: Right IJ chest port with tip overlying the right atrium. The port is accessed. Widening of the mediastinum is again seen. Enlarged cardiac silhouette. Basilar atelectasis or scar. No pneumothorax. Film is underinflated. No edema or effusion. Degenerative changes of the spine IMPRESSION: Underinflation.  Basilar atelectasis or scar. Widened mediastinum. Similar previous. Please correlate with prior PET-CT 12/27/2022 demonstrating prominent mediastinal fat. Chest port Electronically Signed   By: Karen Kays M.D.   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 EEG to evaluate for seizure Level of alertness: Awake, asleep AEDs during EEG study: None Technical aspects: This EEG study  was done with scalp electrodes positioned according to the 10-20 International system of electrode placement. Electrical activity was reviewed with band pass filter of 1-70Hz , sensitivity of 7 uV/mm, display speed of 79mm/sec with a 60Hz  notched filter applied as appropriate. EEG data were recorded continuously and digitally stored.  Video monitoring was available and reviewed as appropriate. Description: The posterior dominant rhythm consists of 8-9 Hz activity of moderate voltage (25-35 uV) seen predominantly in posterior head regions, symmetric and reactive to eye opening and eye closing. Sleep was characterized by vertex waves, sleep spindles (12 to 14 Hz), maximal frontocentral region. EEG showed continuous sharply contoured 3 to 6 Hz theta-delta slowing in right parietal region consistent with breach artifact. Sharp waves were noted in right parietal region. Hyperventilation and photic stimulation were not performed.   ABNORMALITY - Sharp wave, right parietal region - Continuous slow,  right parietal region IMPRESSION: This study showed evidence of epileptogenicity arising from right parietal region. Additionally there is cortical dysfunction arising from right parietal region consistent with underlying craniotomy. No seizures were seen throughout the recording. Charlsie Quest   MR Brain W and Wo Contrast Result Date: 04/07/2023 CLINICAL DATA:  Provided history: Neuro deficit, persistent/recurrent, CNS neoplasm suspected. Diffuse large B-cell lymphoma. Left-sided weakness with midline shift on CT. EXAM: MRI HEAD WITHOUT AND WITH CONTRAST TECHNIQUE: Multiplanar, multiecho pulse sequences of the brain and surrounding structures were obtained without and with intravenous contrast. CONTRAST:  8mL GADAVIST GADOBUTROL 1 MMOL/ML IV SOLN COMPARISON:  Head CT 04/07/2023.  Brain MRI 03/07/2023 neck FINDINGS: Intermittently motion degraded examination. Most notably, the axial T1 post-contrast sequence and coronal T2  sequence are moderately motion degraded. Within this limitation findings are as follows. Brain: Since the prior MRI 06/05/2023, there has been right parietal craniotomy for biopsy/debulking of an enhancing right parietal lobe tumor. Non-acute blood products at the operative site. The residual enhancing tumor measures 5.4 x 3.6 x 4.4 cm (previously 4.4 x 3.0 x 3.3 when remeasured on prior). Prominent surrounding edema slightly increased. 6 mm leftward midline shift has also increased (previously 4 mm). Persistent effacement of portions of the right lateral ventricle. Persistent asymmetric prominence of the right lateral ventricle temporal horn consistent with ventricular entrapment. Trace postoperative subdural collection along the anterior right cerebral hemisphere. Mild-to-moderate multifocal T2 FLAIR hyperintense signal abnormality elsewhere within the cerebral white matter, nonspecific but compatible with chronic small vessel ischemic disease. There is no acute infarct. Vascular: Maintained flow voids within the proximal large arterial vessels. Skull and upper cervical spine: Right parietal cranioplasty. No focal worrisome marrow lesion. Sinuses/orbits: No orbital mass or acute orbital finding.Mild mucosal thickening within the left maxillary sinus. IMPRESSION: 1. Motion degraded examination. 2. Since the prior brain MRI 03/07/2023, there has been right parietal craniotomy for biopsy/debulking of an enhancing right parietal lobe tumor. The residual tumor has increased in size, now measuring 5.4 x 3.6 x 4.4 cm (previously 4.4 x 3.0 x 3.3 cm). Prominent surrounding edema has slightly increased. 6 mm leftward midline shift has also increased. Unchanged asymmetric prominence of the right lateral ventricle temporal horn consistent with ventricular entrapment.  Electronically Signed   By: Jackey Loge D.O.   On: 04/07/2023 14:01   CT Head Wo Contrast Result Date: 04/07/2023 CLINICAL DATA:  Facial paralysis/weakness  (CN 7) EXAM: CT HEAD WITHOUT CONTRAST TECHNIQUE: Contiguous axial images were obtained from the base of the skull through the vertex without intravenous contrast. RADIATION DOSE REDUCTION: This exam was performed according to the departmental dose-optimization program which includes automated exposure control, adjustment of the mA and/or kV according to patient size and/or use of iterative reconstruction technique. COMPARISON:  Head CT 03/06/2023, brain MR 03/07/2023 FINDINGS: Brain: No hemorrhage. No hydrocephalus. No extra-axial fluid collection postsurgical changes from a right parietal craniotomy with interval increase in size of the residual/recurrent tumor at the craniotomy site, now measuring up to 4.7 x 2.7 cm, previously 3.2 x 2.5 cm, when measured in a similar orientation. There is increased leftward midline shift, now measuring up to 6 mm, previously 3 mm. Unchanged disproportionate enlargement of the right temporal horn, worrisome for ventricular entrapment. Vascular: No hyperdense vessel or unexpected calcification. Skull: Normal. Negative for fracture or focal lesion. Status post parietal craniotomy Sinuses/Orbits: No middle ear or mastoid effusion. Mucosal thickening in the bilateral maxillary sinuses, right-greater-than-left orbits are unremarkable. Other: None. IMPRESSION: 1. Postsurgical changes from a right parietal craniotomy with interval increase in size of the residual/recurrent tumor at the craniotomy site, now measuring up to 4.7 x 2.7 cm, previously 3.2 x 2.5 cm. There is increased leftward midline shift, now measuring up to 6 mm, previously 3 mm. Recommend further evaluation with brain MRI. 2. Unchanged asymmetric enlargement of the right temporal horn, compatible with ventricular entrapment Electronically Signed   By: Lorenza Cambridge M.D.   On: 04/07/2023 10:57    ASSESSMENT & PLAN:   1.  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   Partial sensory seizures, left side -Complaining of left-sided paresthesia which he states causes his fingers to feel numb for a few seconds.   - May be due to partial sensory seizures. Was seen by Neuro, s/p EEG last admission, no seizures seen. - Currently on Keppra 750 mg po bid.  Started on 500 mg during last admission with siginificant improvement in symptoms.  Should continue to improve with treatment for CNS lymphoma. - Will consider repeat MRI brain due to ongoing symptoms - Continue to monitor closely   3.  History of Altered mental status  4. Thrombocytopenia - Mild, likely due to recent chemotherapy treatment - platelets 134k today - No intervention required at this time   5.  Elevated blood glucose  - Monitor blood sugar levels closely. -Primary team following    6.  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  A-fib - S/p Eliquis, d/c'd due to high risk for bleeding  - Follow closely with cardiology   8.  Prostate cancer - Diagnosed 2021   9.  Hep B antibody positive, chronic - On tenofovir for hep B reactivation suppression in context of Rituxan use.  Plan is to continue for 6 months after his last chemotherapy. - Follow ID outpatient   10.  VTE prophylaxis - With Lovenox  PLAN - intubated sedated on vent Methotrexate levels 0.14. continue Leucovorin rescue 25mg  every 6 hours till methotrexate levels <0.1 Dexamethasone 20mg  with zofran once daily till Methotrexate levels <0.1 and then switch to Dexamethasone 4mg  po BID. Okay to DC bicarb  drip this evening. CT head with no overt bleed/CVA's CT neck and chest with no acute pathology Having EEG monitoring. Currently on Propofol drip and Keppra. Appreciate help from PCCM team. Case discussed in details with Dr Marchelle Gearing  .The total time spent in the appointment was 35 minutes* .  All of the patient's questions were answered with apparent satisfaction. The  patient knows to call the clinic with any problems, questions or concerns.   Wyvonnia Lora MD MS AAHIVMS [facility] Hematology/Oncology Physician [facility]  .*Total Encounter Time as defined by the Centers for Medicare and Medicaid Services includes, in addition to the face-to-face time of a patient visit (documented in the note above) non-face-to-face time: obtaining and reviewing outside history, ordering and reviewing medications, tests or procedures, care coordination (communications with other health care professionals or caregivers) and documentation in the medical record.

## 2023-04-27 NOTE — Procedures (Signed)
 Extubation Procedure Note  Patient Details:   Name: Tyler [name] DOB: 02-29-1950 MRN: [id number]   Airway Documentation:  Airway 7.5 mm (Active)  Secured at (cm) 23 cm 04/27/23 0757  Measured From Lips 04/27/23 0757  Secured Location Center 04/27/23 0757  Secured By [employer] 04/27/23 0757  Bite Block No 04/27/23 0757  Tube Holder Repositioned Yes 04/27/23 0757  Prone position No 04/27/23 0757  Head position Right 04/27/23 0757  Cuff Pressure (cm H2O) Green OR 18-26 [facility] 04/27/23 0757  Site Condition Dry 04/27/23 0757   Vent end date: (not recorded) Vent end time: (not recorded)   Evaluation  O2 sats: stable throughout Complications: No apparent complications Patient did tolerate procedure well. Bilateral Breath Sounds: Clear   Yes  PT extubated per MD order. Pt did have audible cuff leak.  Pt placed on 4l [place] post extubation. RN at bedside.  Cain Sieve 04/27/2023, 11:29 AM

## 2023-04-27 NOTE — Plan of Care (Signed)
  Problem: Nutrition: Goal: Adequate nutrition will be maintained Outcome: Progressing   Problem: Pain Managment: Goal: General experience of comfort will improve and/or be controlled Outcome: Progressing   Problem: Skin Integrity: Goal: Risk for impaired skin integrity will decrease Outcome: Progressing   Problem: Safety: Goal: Non-violent Restraint(s) Outcome: Progressing

## 2023-04-27 NOTE — Progress Notes (Signed)
 LTM EEG hooked up and running - no initial skin breakdown - push button tested - [facility] monitoring.

## 2023-04-27 NOTE — Progress Notes (Signed)
 PCCM note  Patient seen on arrival in 4N. He is awake, stable Hooked up to LTM for EEG monitoring.  Neurology is aware Continue to monitor sugars.  Chilton Greathouse MD [facility] See Amion for pager  If no response to pager , please call [phone] until 7pm After 7:00 pm call Elink  [phone] 04/27/2023, 4:55 PM

## 2023-04-27 NOTE — Consult Note (Signed)
 NAME:  Tyler [name], MRN:  [id number], DOB:  03/28/1950, LOS: 5 ADMISSION DATE:  04/22/2023, CONSULTATION/ SERVICE  DATE:  04/25/23 REFERRING MD:  Dr. Candise Che, CHIEF COMPLAINT: Neck swelling, drooling, difficulty swallowing, altered mental status with concern for partial seizures  History of Present Illness:  Tyler [name] is a 73 year old gentleman with significant history of CNS relapse of large B-cell lymphoma, partial seizure who was admitted to cardiology service on 2/25 for second cycle of high-dose methotrexate with leucovorin rescue.  Patient also has prior history of paroxysmal atrial fibrillation not on anticoagulation due to bleeding risk, prostate cancer status post prostatectomy in 2021, pulmonary embolism, hepatitis B exposure.  Patient had high-grade large B-cell lymphoma and completed 6 cycles of R-CHOP with systemic remission in the past.  As per the primary oncologist, patient was doing well and receiving his medication as planned.  He was started on bicarb drip to minimize the methotrexate related toxicity.  However, this evening, oncologist noted increased neck swelling as well as patient had difficulty swallowing.  He was also noted to have drooling of saliva and developed altered mental status with facial twitching concerning for seizure activity.  There was concern about patient's ability to protect airway and possible angioedema (neck swelling), ED was consulted and patient was intubated emergently.  At the time of my evaluation, patient was intubated and unresponsive.  No family member at the bedside.  Rest of the information is as per the chart review and I was unable to independently verify with the patient.   PAST    has a past medical history of Cancer (HCC), Follicular lymphoma (HCC) (2024), History of pulmonary embolism, Hypertension, Myelodysplastic syndrome (HCC), PAF (paroxysmal atrial fibrillation) (HCC), and Recurrent UTI.   has a past surgical history that includes  Robot assisted laparoscopic radical prostatectomy (N/A, 09/01/2019); Lymphadenectomy (Bilateral, 09/01/2019); Esophagogastroduodenoscopy (egd) with propofol (N/A, 08/21/2022); biopsy (08/21/2022); IR IMAGING GUIDED PORT INSERTION (08/26/2022); Craniotomy (Right, 03/10/2023); and Application of cranial navigation (Right, 03/10/2023).   EVENTS   04/22/2023 - ADMIT  2/27  - MRI brain - Marked interval decrease in size of right parietal mass/lymphoma, now measuring 2.9 x 3.5 x 2.3 cm (previously 5.4 x 3.6 x 4.4 cm), consistent with interval response to therapy. Persistent but improved vasogenic edema within the adjacent posterior right cerebral hemisphere, with interval resolution of previously seen right-to-left midline shift. 2. 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 Correlation with symptomatology and EEG recommended. Evolving changes of subacute ischemia would be the primary differential consideration, but are felt to be less likely.  2/28 - Transferred to ICU and intubated. Witnessed by Dr Candise Che with have left neck swelling, gurgling, and twitching left side -> then partially respnsvive -> ativan/confused -> EDP intubated. Around 7.30-8pm  04/26/23: Coretrack placed early hours .Intubated early hours.  On the ventilator FiO2 50%.  Low-grade fever since 04/17/2023 but normal white count. Ceribell EEG oongoing. Prmary language for aptient is Swahili. SBP HIGH with HR 40s - on scheduled lopressor . Ceribel shows    ceribell EEG is is suggestive of severe diffuse encephalopathy. No seizures were seen throughout the recording.   - Blood clture  - Tracheal  aspirate   SUBJECTIVE/OVERNIGHT/INTERVAL HX   3/2 - sedation cut in half and doing SBT . RN concerned about extubation due to copiou s "oral secretions". Following commands with RASS -2. No visible seizures. Neuro recommending transfe to [facility]  c-EEG due to concerns for deep focial and senzory seizures that sedation/ceribell will not pick up.  T masx 99.. Culure negative   Objective   Blood pressure 109/65, pulse (!) 59, temperature 99.7 F (37.6 C), resp. rate 18, height 5\' 7"  (1.702 m), weight 85.5 kg, SpO2 100%.    Vent Mode: PRVC FiO2 (%):  [40 %-50 %] 40 % Set Rate:  [18 bmp] 18 bmp Vt Set:  [520 mL] 520 mL PEEP:  [5 cmH20] 5 cmH20 Plateau Pressure:  [15 cmH20-22 cmH20] 15 cmH20   Intake/Output Summary (Last 24 hours) at 04/27/2023 0751 Last data filed at 04/27/2023 0550 Gross per 24 hour  Intake 4004.49 ml  Output 2305 ml  Net 1699.49 ml   Filed Weights   04/23/23 1802 04/26/23 0500 04/27/23 0500  Weight: 84.8 kg 83.5 kg 85.5 kg    Examination: General Appearance:  Looks criticall ill OBESE - yes Head:  Normocephalic, without obvious abnormality, atraumatic Eyes:  PERRL - yes, conjunctiva/corneas - muddy     Ears:  Normal external ear canals, both ears Nose:  G tube - YES Throat:  ETT TUBE - YEs , OG tube - no. CERIBEELL EEG + Neck:  Supple,  No enlargement/tenderness/nodules Lungs: Clear to auscultation bilaterally, Ventilator   Synchrony - yes Heart:  S1 and S2 normal, no murmur, CVP - no.  Pressors - no Abdomen:  Soft, no masses, no organomegaly Genitalia / Rectal:  Not done Extremities:  Extremities- intact Skin:  ntact in exposed areas . Sacral area - no decub per rN Neurologic:  Sedation - diprivan and fent gtt and bic gtt -> RASS - -4 . Moves all 4s - x. CAM-ICU - NO . Orientation - RASS -4    General Appearance:  Looks criticall ill OBESE - + Head:  Normocephalic, without obvious abnormality, atraumatic Eyes:  PERRL - yes, conjunctiva/corneas - muddy     Ears:  Normal external ear canals, both ears Nose:  G tube - YES PANDA with TF Throat:  ETT TUBE - YES , OG tube - no Neck:  Supple,  No enlargement/tenderness/nodules Lungs: Clear to auscultation bilaterally, Ventilator   Synchrony - yes PS  CPAP Heart:  S1 and S2 normal, no murmur, CVP - no.  Pressors - no Abdomen:  Soft, no masses, no organomegaly Genitalia / Rectal:  Not done Extremities:  Extremities- intact Skin:  ntact in exposed areas . Sacral area - not examind Neurologic:  Sedation - ON wUA  hust half fent/dipriva -> RASS - -2 . Moves all 4s - yes. CAM-ICU - cannot assess . Orientation - cannot asess but follows commands      Assessment & Plan:   PULMONARY  A:  Acute respiratory failure 04/25/2023 evening associated with neck swelling  (likely fat tissue chages with seizures) and seizures and  postictal state  - s/p Intubation .   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  S/p craniotomy with gross total resection 03/10/23.  - CT of head 04/07/2023 showed  interval increase in size of tumor at the craniotomy site.   -  MRI of head 04/07/2023 increase in size of tumor with slightly increased surrounding edema.   MRI brain 2/27  - improvmeent - Currently on Hi-dose methotrexate chemotherapy regimen which is more aggressive approach.  Received cycle 1 on 04/09/23. - Admitted for cycle 2 of HIGH DOSE Methotrexate chemotherapy regimen,  completed 04/23/2023.  PLAN- Per oncolgy - IV sodium bicarb ongoing til methotrexate level < 0/1 - IV dexamethassonme for brain edema 20mg  daily till methotreaxate leve <0.1 - - Continue IV leucovorin rescue until methotrexate level <0.1 - Continue to monitor urine pH daily.  Urine pH 9.0 today - Monitor methotrexate level daily until <0.1. -> stop bic at this point -  Methotrexate level 3/1 - Dr Candise Che monitoring  - IV Rituxan as outpatient between 2nd and 3rd cycle of methotrexate and subsequently between treatment cycles.  - first week of March 2025 if discharged - can be delayed if needed - Continue daily labs - Medical oncology/Dr. Candise Che following closely.       NEUROLOGIC   Partial sensory seizures, left side -Complaining of left-sided paresthesia which he states causes his fingers to feel numb for a few seconds.   - May be due to partial sensory seizures - seen by NSGY 04/07/23 -AS of 2/28: y on Keppra 750 mg po bid.  (Started on 500 mg during last admission with siginificant improvement in symptoms) = per pnco.  Should continue to improve with treatment for CNS lymphoma. MRI brain 2/28: with improvement in mass - Had partial complext 2/28 witnessed by Oncology Dr Candise Che - LEft sided focal -> then EDP intubated - Keppra increased 04/26/23 by neuro    Acute encephalopathy  -on 04/25/2203 felt due to CNS lymphoma with edema  - 04/25/2023: Morning reports of relatively normal mental status but at night sudden worsening with obtundation and potential seizures and intubated. MRI Mass improvd but 2/27 - New diffusion signal abnormality involving the right insular cortex and overlying right frontal lobe, favored to reflect sequelae of seizur   3/2 - doing wake up exam without seizures  P:   - d.w Dr Iver Nestle  - Move to [facility] for c-EEg (per Dr Candise Che no objections)  - continue keppra at high dose    Hypertensive and Bradycardic on lopressor 04/26/23 Normal echo 04/26/23  P:  DC lopressor Rx Hydralazine Tele    =    Hep B antibody positive, chronic - On tenofovir for hep B reactivation suppression in context of Rituxan use.  P:     Plan is to continue for 6 months after his last chemotherapy. - Follow ID outpatient  NEw FEVER 04/25/23  3/2- low grade fever + . Negative culture. PCT Normal  Plan  - await culture  - monitor off  antibiotics   = AKI  2/28   - resolved   P:  Fluids bicarb till methotexate level < 0.1 Continue LR at 75mL   Low K P: replete     Panda +. Abd soft  P:   Start TF Trickles since 04/26/23    Mild anemia of critical illness onset less than 13 g% 04/26/2023   P:  - PRBC for hgb </= 6.9gm%    - exceptions are   -  if ACS susepcted/confirmed then transfuse for hgb </= 8.0gm%,  or    -  active bleeding with hemodynamic instability, then transfuse regardless of hemoglobin value   At  at all times try to transfuse 1 unit prbc as possible with exception of active hemorrhage    Mild thrombocytopenia improving  P Monitor Lovenox 40 mg every 24 hours     At risk for hypo and hyperglycemia P:   SSI     Best practice:  Diet: TF trickele 3/12/5 Pain/Anxiety/Delirium protocol (if indicated): PAD VAP protocol (if indicated): yes DVT prophylaxis: lovnoex GI prophylaxis: pepcid Glucose control: ssi Mobility: bed rest Code Status: full Family Communication:   3/1 - Daughter Sherron Flemings - > [phone] => updated her over phone. Also d/w Dr Candise Che  3/2 - Daughter Sherron Flemings -> upated over phone.  Explained need to go to [facility] 04/27/23 for c-EEG   DISPO  - keep ICU but aim for tx to 4N at [facility]       ATTESTATION & SIGNATURE   The patient Tyler [name] is critically ill with multiple organ systems failure and requires high complexity decision making for assessment and support, frequent evaluation and titration of therapies, application of advanced monitoring technologies and extensive interpretation of multiple databases and discussion with other appropriate health care personnel such as bedside nurses, social workers, case Production designer, theatre/television/film, consultants, respiratory therapists, nutritionists, secretaries etc.,  Critical care time includes but is not restricted to just documentation time. Documentation can happen in parallel or sequential to care time depending on case  mix urgency and priorities for the shift. So, overall critical Care Time devoted to patient care services described in this note is  40  Minutes.   This time reflects time of care of this signee Dr Kalman Shan which includ does not reflect procedure time, or teaching time or supervisory time of PA/NP/Med student/Med Resident etc but could involve care discussion time     Dr. Kalman Shan, M.D., [facility].C.P Pulmonary and Critical Care Medicine Staff Physician, [facility] [place] [facility] Pager: [phone], If no answer or between  15:00h - 7:00h: call [phone]  04/27/2023 9:02 AM   LABS    PULMONARY Recent Labs  Lab 04/25/23 2327  PHART 7.55*  PCO2ART 43  PO2ART 198*  HCO3 37.6*  O2SAT 100    CBC Recent Labs  Lab 04/25/23 2245 04/26/23 0500 04/27/23 0432  HGB 13.1 12.9* 12.4*  HCT 40.5 40.0 38.3*  WBC 4.9 4.6 4.3  PLT 142* 149* 140*    COAGULATION No results for input(s): "INR" in the last 168 hours.  CARDIAC  No results for input(s): "TROPONINI" in the last 168 hours. No results for input(s): "PROBNP" in the last 168 hours.   CHEMISTRY Recent Labs  Lab 04/22/23 1546 04/23/23 0501 04/24/23 2956 04/25/23 0528 04/25/23 2245 04/26/23 0500 04/26/23 1605 04/27/23 0432  NA 135   < > 136 138 139 138  --  140  K 3.4*   < > 3.6 3.4* 3.6 3.3*  --  3.6  CL 103   < > 97* 94* 93* 97*  --  104  CO2 21*   < > 31 36* 34* 30  --  28  GLUCOSE 130*   < > 88 101* 106* 149*  --  112*  BUN 23   < > 24* 24* 21 21  --  26*  CREATININE 0.90   < > 0.82 1.18 1.26* 1.08  --  0.93  CALCIUM 8.4*   < > 7.8* 8.1* 8.3* 8.0*  --  8.0*  MG 1.8  --   --   --   --   --  2.1 2.0  PHOS 3.0  --   --   --   --   --  3.7 2.9   < > = values in this interval not displayed.   Estimated Creatinine Clearance: 77.3 mL/min (by C-G formula based on SCr of 0.93 mg/dL).   LIVER Recent Labs  Lab 04/24/23 0611 04/25/23 0528 04/25/23 2245 04/26/23 0500  04/27/23 0432  AST 26 26 34 29 20  ALT 54* 49* 49* 44 33  ALKPHOS 75 69 68 61 54  BILITOT 0.8 1.0 1.1 1.3* 0.9  PROT 5.0* 5.0* 6.0* 5.2* 4.9*  ALBUMIN 2.9* 2.9* 3.3* 2.9* 2.7*     INFECTIOUS Recent Labs  Lab 04/26/23 1008 04/27/23 0432  LATICACIDVEN 3.6* 2.1*  PROCALCITON <0.10  --      ENDOCRINE CBG (last 3)  Recent Labs    04/27/23 0339 04/27/23 0745 04/27/23 0749  GLUCAP 81 53* 76         IMAGING x48h  - image(s) personally visualized  -   highlighted in bold ECHOCARDIOGRAM COMPLETE Result Date: 04/26/2023    ECHOCARDIOGRAM REPORT   Patient Name:   LENOARD HELBERT Date of Exam: 04/26/2023 Medical Rec #:  [id number]    Height:       67.0 in Accession #:    [id number]   Weight:       184.1 lb Date of Birth:  1950-11-06    BSA:          1.952 m Patient Age:    72 years     BP:           151/88 mmHg Patient Gender: M            HR:           48 bpm. Exam Location:  Inpatient Procedure: 2D Echo, Color Doppler and Cardiac Doppler (Both Spectral and Color            Flow Doppler were utilized during procedure). Indications:    Resp Distress  History:        Patient has prior history of Echocardiogram examinations, most                 recent 10/03/2022. Chemo; Risk Factors:Hypertension.  Sonographer:    Amy Chionchio Referring Phys: [id number] Karli Wickizer IMPRESSIONS  1. Left ventricular ejection fraction, by estimation, is 60 to 65%. The left ventricle has normal function. The left ventricle has no regional wall motion abnormalities. There is mild left ventricular hypertrophy. Left ventricular diastolic parameters are indeterminate.  2. Right ventricular systolic function is normal. The right ventricular size is normal. Tricuspid regurgitation signal is inadequate for assessing PA pressure.  3. The mitral valve is normal in structure. Trivial mitral valve regurgitation. No evidence of mitral stenosis.  4. The aortic valve is tricuspid. Aortic valve regurgitation is not visualized. No aortic  stenosis is present.  5. The inferior vena cava is dilated in size with <50% respiratory variability, suggesting right atrial pressure of 15 mmHg. FINDINGS  Left Ventricle: Left ventricular ejection fraction, by estimation, is 60 to 65%. The left ventricle has normal function. The left ventricle has no regional wall motion abnormalities. Strain imaging was not performed. The left ventricular internal cavity  size was normal in size. There is mild left ventricular hypertrophy. Left ventricular diastolic parameters are indeterminate. Right Ventricle: The right ventricular size is normal. No increase in right ventricular wall thickness. Right ventricular systolic function is normal. Tricuspid regurgitation signal is inadequate for  assessing PA pressure. Left Atrium: Left atrial size was normal in size. Right Atrium: Right atrial size was normal in size. Pericardium: There is no evidence of pericardial effusion. Mitral Valve: The mitral valve is normal in structure. Trivial mitral valve regurgitation. No evidence of mitral valve stenosis. MV peak gradient, 2.4 mmHg. The mean mitral valve gradient is 1.0 mmHg. Tricuspid Valve: The tricuspid valve is normal in structure. Tricuspid valve regurgitation is trivial. Aortic Valve: The aortic valve is tricuspid. Aortic valve regurgitation is not visualized. No aortic stenosis is present. Aortic valve mean gradient measures 3.0 mmHg. Aortic valve peak gradient measures 5.7 mmHg. Aortic valve area, by VTI measures 2.11 cm. Pulmonic Valve: The pulmonic valve was grossly normal. Pulmonic valve regurgitation is not visualized. Aorta: The aortic root and ascending aorta are structurally normal, with no evidence of dilitation. Venous: The inferior vena cava is dilated in size with less than 50% respiratory variability, suggesting right atrial pressure of 15 mmHg. IAS/Shunts: The interatrial septum was not well visualized. Additional Comments: 3D imaging was not performed.  LEFT  VENTRICLE PLAX 2D LVIDd:         4.20 cm     Diastology LVIDs:         2.70 cm     LV e' medial:    5.11 cm/s LV PW:         1.10 cm     LV E/e' medial:  14.0 LV IVS:        0.90 cm     LV e' lateral:   7.94 cm/s LVOT diam:     2.20 cm     LV E/e' lateral: 9.0 LV SV:         58 LV SV Index:   30 LVOT Area:     3.80 cm  LV Volumes (MOD) LV vol d, MOD A2C: 69.9 ml LV vol d, MOD A4C: 90.8 ml LV vol s, MOD A2C: 23.8 ml LV vol s, MOD A4C: 29.9 ml LV SV MOD A2C:     46.1 ml LV SV MOD A4C:     90.8 ml LV SV MOD BP:      53.6 ml RIGHT VENTRICLE             IVC RV Basal diam:  3.10 cm     IVC diam: 2.20 cm RV S prime:     14.90 cm/s TAPSE (M-mode): 1.7 cm LEFT ATRIUM             Index        RIGHT ATRIUM          Index LA Vol (A2C):   31.3 ml 16.03 ml/m  RA Area:     9.52 cm LA Vol (A4C):   54.0 ml 27.66 ml/m  RA Volume:   14.30 ml 7.32 ml/m LA Biplane Vol: 43.6 ml 22.33 ml/m  AORTIC VALVE                    PULMONIC VALVE AV Area (Vmax):    2.48 cm     PV Vmax:       0.77 m/s AV Area (Vmean):   2.33 cm     PV Peak grad:  2.4 mmHg AV Area (VTI):     2.11 cm AV Vmax:           119.00 cm/s AV Vmean:          78.400 cm/s AV VTI:  0.275 m AV Peak Grad:      5.7 mmHg AV Mean Grad:      3.0 mmHg LVOT Vmax:         77.50 cm/s LVOT Vmean:        48.100 cm/s LVOT VTI:          0.153 m LVOT/AV VTI ratio: 0.56  AORTA Ao Root diam: 3.30 cm Ao Asc diam:  3.40 cm MITRAL VALVE MV Area (PHT): 2.70 cm    SHUNTS MV Area VTI:   2.10 cm    Systemic VTI:  0.15 m MV Peak grad:  2.4 mmHg    Systemic Diam: 2.20 cm MV Mean grad:  1.0 mmHg MV Vmax:       0.77 m/s MV Vmean:      35.3 cm/s MV Decel Time: 281 msec MV E velocity: 71.70 cm/s MV A velocity: 83.10 cm/s MV E/A ratio:  0.86 Epifanio Lesches MD Electronically signed by Epifanio Lesches MD Signature Date/Time: 04/26/2023/2:18:08 PM    Final    DG CHEST PORT 1 VIEW Result Date: 04/26/2023 CLINICAL DATA:  Endotracheal tube present. EXAM: PORTABLE CHEST 1 VIEW  COMPARISON:  Radiograph yesterday.  CT earlier today FINDINGS: Endotracheal tube tip 4.6 cm from the carina. Weighted enteric tube tip below the diaphragm not included in the field of view. Right chest port unchanged in position. Stable heart size and mediastinal contours. Left lung base opacity has progressed from yesterday's radiograph, subtotal lower lobe collapse on CT. Additional streaky atelectasis in the left mid lung. Right lower lobe opacity on CT has no definite radiographic correlate. No pneumothorax or large pleural effusion. IMPRESSION: 1. Endotracheal tube tip 4.6 cm from the carina. 2. Left lung base opacity has progressed from yesterday's radiograph, subtotal lower lobe collapse on this morning's CT. Additional streaky atelectasis in the left mid lung. Electronically Signed   By: Narda Rutherford M.D.   On: 04/26/2023 13:27   CT CHEST W CONTRAST Result Date: 04/26/2023 CLINICAL DATA:  Head neck cancer.  Airway compromise with intubation EXAM: CT CHEST WITH CONTRAST TECHNIQUE: Multidetector CT imaging of the chest was performed during intravenous contrast administration. RADIATION DOSE REDUCTION: This exam was performed according to the departmental dose-optimization program which includes automated exposure control, adjustment of the mA and/or kV according to patient size and/or use of iterative reconstruction technique. CONTRAST:  OMNIPAQUE IOHEXOL 300 MG/ML  SOLN COMPARISON:  Head CT 04/16/2023 FINDINGS: Cardiovascular: Normal heart size. No pericardial effusion. Atheromatous calcification of the coronary arteries. Porta catheter with tip at the upper right atrium. Mediastinum/Nodes: Located endotracheal and enteric tube. No mass or adenopathy. Lungs/Pleura: Multi segment opacification in the lower lobes with volume loss, worse on the left. No consolidation, edema, effusion, or pneumothorax. Upper Abdomen: Enteric tube with tip at the pylorus. No acute finding Musculoskeletal: Thoracic  spondylosis.  No acute finding IMPRESSION: Multi segment atelectasis in the lower lobes Electronically Signed   By: Tiburcio Pea M.D.   On: 04/26/2023 06:31   CT HEAD WO CONTRAST ( ) Result Date: 04/26/2023 CLINICAL DATA:  Mental status change.  History of CNS lymphoma. EXAM: CT HEAD WITHOUT CONTRAST TECHNIQUE: Contiguous axial images were obtained from the base of the skull through the vertex without intravenous contrast. RADIATION DOSE REDUCTION: This exam was performed according to the departmental dose-optimization program which includes automated exposure control, adjustment of the mA and/or kV according to patient size and/or use of iterative reconstruction technique. COMPARISON:  Brain MRI 04/24/2023 FINDINGS: Brain: Cortical  edema at the right insula correlating with abnormality on prior brain MRI. Small area of blood products in the parasagittal right parietal lobe with adjacent swelling, operative site. No evidence of interval infarct, hydrocephalus, or collection. Vascular: No hyperdense vessel or unexpected calcification. Skull: Unremarkable right parietal craniotomy site Sinuses/Orbits: Negative IMPRESSION: No acute finding when compared to 04/24/2023 brain MRI. Electronically Signed   By: Tiburcio Pea M.D.   On: 04/26/2023 05:09   CT SOFT TISSUE NECK W CONTRAST Result Date: 04/26/2023 CLINICAL DATA:  Soft tissue infection suspected. Neck swelling and airway compromise EXAM: CT NECK WITH CONTRAST TECHNIQUE: Multidetector CT imaging of the neck was performed using the standard protocol following the bolus administration of intravenous contrast. RADIATION DOSE REDUCTION: This exam was performed according to the departmental dose-optimization program which includes automated exposure control, adjustment of the mA and/or kV according to patient size and/or use of iterative reconstruction technique. CONTRAST:  OMNIPAQUE IOHEXOL 300 MG/ML  SOLN COMPARISON:  04/16/2023 head CT FINDINGS:  Pharynx and larynx: The airway is collapsed around endotracheal tube and enteric tube with fluid layering in the nasopharynx. This limits assessment of the mucosal surfaces, no evidence of submucosal edema. Salivary glands: No inflammation, mass, or stone. Thyroid: Normal. Lymph nodes: None enlarged or heterogeneous. Vascular: No acute finding Limited intracranial: Negative Visualized orbits: Negative Mastoids and visualized paranasal sinuses: Clear Skeleton: No acute or aggressive finding. Upper chest: Clear apical lungs. IMPRESSION: No acute finding. Assessment of mucosal surfaces limited by airway collapse in the setting of intubation. Electronically Signed   By: Tiburcio Pea M.D.   On: 04/26/2023 05:03   DG Abd 1 View Result Date: 04/26/2023 CLINICAL DATA:  Check gastric catheter placement EXAM: ABDOMEN - 1 VIEW COMPARISON:  None Available. FINDINGS: Weighted feeding catheter is noted in the distal stomach directed towards the pole or is. No free air is seen. No obstructive changes are noted. IMPRESSION: Feeding catheter as described. Electronically Signed   By: Alcide Clever M.D.   On: 04/26/2023 02:11   DG CHEST PORT 1 VIEW Result Date: 04/25/2023 CLINICAL DATA:  Check endotracheal tube placement EXAM: PORTABLE CHEST 1 VIEW COMPARISON:  Film from earlier in the same day. FINDINGS: Endotracheal tube is noted 5.3 cm above the carina. Gastric catheter extends into the stomach. Right chest wall port is again seen. Cardiac shadow is stable. No focal infiltrate is noted. Mild basilar atelectasis is seen. IMPRESSION: Tubes and lines as described. Electronically Signed   By: Alcide Clever M.D.   On: 04/25/2023 21:44   DG Chest Port 1 View Result Date: 04/25/2023 CLINICAL DATA:  Altered mental status EXAM: PORTABLE CHEST 1 VIEW COMPARISON:  04/10/2023 FINDINGS: Cardiac shadow is enlarged but stable. Right chest wall port is again seen. Mild basilar atelectasis is seen. No bony abnormality is noted. IMPRESSION:  Mild basilar atelectasis. Electronically Signed   By: Alcide Clever M.D.   On: 04/25/2023 20:59

## 2023-04-27 NOTE — Progress Notes (Incomplete)
 NEUROLOGY CONSULT FOLLOW UP NOTE   Date of service: April 27, 2023 Patient Name: Tyler [name] MRN:  [id number] DOB:  1950/12/15  Tyler [name] is a 73 y.o. male with hx of afib not on anticoagulation due to bleeding risk, prostate cancers s/p prostatectomy in 2021, PE, hep B exposure, CNS relapse of large B-cell lymphoma, partial seizure who was admitted to cardiology service on 2/25 for second cycle of high-dose methotrexate with leucovorin rescue. Patient was found to have significant neck swelling, difficulty breathing, left facial twitching and drooling on 2/28 and a rapid response was called.  Dr. Candise Che at the bedside and patient was transferred to ICU and intubation for airway protection.  He received 1000 mg IV Keppra and has continued on 750 mg twice daily.   Per chart review he did have partial sensory seizures on the left side of the body which have improved on Keppra and with treatment. He was still having minimal symptoms in the left leg that lasted a few seconds at a time. EEG completed 2/13 shows evidence of epileptogenicity arising from the right parietal region.   He was placed on limited Ceribell EEG which was negative for seizures. However, MRI Brain on 04/24/23 with R insular/frontal diffusion changes favored to be from seizure.  Interval Hx/subjective   *** Vitals   Current vital signs: BP 139/72   Pulse 76   Temp (!) 101.3 F (38.5 C)   Resp (!) 21   Ht 5\' 7"  (1.702 m)   Wt 85.5 kg   SpO2 100%   BMI 29.52 kg/m  Vital signs in last 24 hours: Temp:  [98.8 F (37.1 C)-102 F (38.9 C)] 101.3 F (38.5 C) (03/02 1400) Pulse Rate:  [54-103] 76 (03/02 1400) Resp:  [15-28] 21 (03/02 1400) BP: (96-158)/(52-92) 139/72 (03/02 1400) SpO2:  [87 %-100 %] 100 % (03/02 1400) FiO2 (%):  [40 %] 40 % (03/02 0757) Weight:  [85.5 kg] 85.5 kg (03/02 0500)   Body mass index is 29.52 kg/m.  Physical Exam   Constitutional: Appears well-developed and well-nourished. *** Psych:  Affect appropriate to situation. *** Eyes: No scleral injection. *** HENT: No OP obstrucion. *** Head: Normocephalic. *** Cardiovascular: Normal rate and regular rhythm. *** Respiratory: Effort normal, non-labored breathing. *** GI: Soft.  No distension. There is no tenderness. *** Skin: WDI. ***  Neurologic Examination   ***  Medications  Current Facility-Administered Medications:    0.9 %  sodium chloride infusion, , Intravenous, Continuous, Candise Che, Corene Cornea, MD, Stopped at 04/25/23 2019   acetaminophen (TYLENOL) tablet 650 mg, 650 mg, Oral, Q4H PRN, Massie Maroon, MD, 650 mg at 04/27/23 0926   alum & mag hydroxide-simeth (MAALOX/MYLANTA) 200-200-20 MG/5ML suspension 60 mL, 60 mL, Oral, Q4H PRN, Candise Che, Corene Cornea, MD   Chlorhexidine Gluconate Cloth 2 % PADS 6 each, 6 each, Topical, Nightly, Johney Maine, MD, 6 each at 04/27/23 0035   cholecalciferol (VITAMIN D3) 25 MCG (1000 UNIT) tablet 2,000 Units, 2,000 Units, Oral, Daily, Johney Maine, MD, 2,000 Units at 04/27/23 0926   dextrose 5 % in lactated ringers infusion, , Intravenous, Continuous, Ramaswamy, Murali, MD, Last Rate: 40 mL/hr at 04/27/23 1240, New Bag at 04/27/23 1240   enoxaparin (LOVENOX) injection 40 mg, 40 mg, Subcutaneous, Q24H, Johney Maine, MD, 40 mg at 04/26/23 2227   famotidine (PEPCID) 40 MG/5ML suspension 20 mg, 20 mg, Per Tube, QHS, Ramaswamy, Carmin Muskrat, MD, 20 mg at 04/26/23 2228   hydrALAZINE (APRESOLINE) injection 10-40 mg, 10-40 mg,  Intravenous, Q4H PRN, Kalman Shan, MD, 20 mg at 04/26/23 1240   hydrocortisone (ANUSOL-HC) 2.5 % rectal cream 1 Application, 1 Application, Rectal, BID PRN, Johney Maine, MD   insulin aspart (novoLOG) injection 0-15 Units, 0-15 Units, Subcutaneous, Q4H, Kalman Shan, MD   levETIRAcetam (KEPPRA) 100 MG/ML solution 1,000 mg, 1,000 mg, Per Tube, BID, Kalman Shan, MD, 1,000 mg at 04/27/23 0926   lidocaine (LMX) 4 % cream, , Topical,  TID PRN, Jimmye Norman, NP   magic mouthwash w/lidocaine, 5 mL, Oral, QID PRN, Johney Maine, MD   ondansetron [facility]) tablet 4-8 mg, 4-8 mg, Oral, Q8H PRN **OR** ondansetron (ZOFRAN-ODT) disintegrating tablet 4-8 mg, 4-8 mg, Oral, Q8H PRN **OR** ondansetron (ZOFRAN) injection 4 mg, 4 mg, Intravenous, Q8H PRN, 4 mg at 04/25/23 0516 **OR** ondansetron (ZOFRAN) 8 mg in sodium chloride 0.9 % 50 mL IVPB, 8 mg, Intravenous, Q8H PRN, Johney Maine, MD   Oral care mouth rinse, 15 mL, Mouth Rinse, Q2H, Johney Maine, MD, 15 mL at 04/27/23 1431   Oral care mouth rinse, 15 mL, Mouth Rinse, PRN, Johney Maine, MD   pantoprazole (PROTONIX) injection 40 mg, 40 mg, Intravenous, Q24H, Revonda Standard, Paras, MD, 40 mg at 04/26/23 2227   polyethylene glycol (MIRALAX / GLYCOLAX) packet 17 g, 17 g, Oral, Daily PRN, Marice Potter, MD   scopolamine (TRANSDERM-SCOP) 1 MG/3DAYS 1.5 mg, 1 patch, Transdermal, Q72H, Elmaghraby, Zaki, MD, 1.5 mg at 04/26/23 0249   senna-docusate (Senokot-S) tablet 1 tablet, 1 tablet, Oral, QHS PRN, Johney Maine, MD   sodium chloride flush (NS) 0.9 % injection 10-40 mL, 10-40 mL, Intracatheter, Q12H, Johney Maine, MD, 10 mL at 04/27/23 0926   sodium chloride flush (NS) 0.9 % injection 10-40 mL, 10-40 mL, Intracatheter, PRN, Johney Maine, MD, 30 mL at 04/27/23 0540   tenofovir alafenamide (VEMLIDY) tablet 25 mg, 25 mg, Oral, Daily, Johney Maine, MD, 25 mg at 04/27/23 0926  Labs and Diagnostic Imaging     Basic Metabolic Panel: Recent Labs  Lab 04/22/23 1546 04/23/23 0501 04/24/23 0611 04/25/23 0528 04/25/23 2245 04/26/23 0500 04/26/23 1605 04/27/23 0432  NA 135   < > 136 138 139 138  --  140  K 3.4*   < > 3.6 3.4* 3.6 3.3*  --  3.6  CL 103   < > 97* 94* 93* 97*  --  104  CO2 21*   < > 31 36* 34* 30  --  28  GLUCOSE 130*   < > 88 101* 106* 149*  --  112*  BUN 23   < > 24* 24* 21 21  --  26*  CREATININE 0.90   < >  0.82 1.18 1.26* 1.08  --  0.93  CALCIUM 8.4*   < > 7.8* 8.1* 8.3* 8.0*  --  8.0*  MG 1.8  --   --   --   --   --  2.1 2.0  PHOS 3.0  --   --   --   --   --  3.7 2.9   < > = values in this interval not displayed.    CBC: Recent Labs  Lab 04/23/23 0501 04/24/23 0611 04/25/23 0528 04/25/23 2245 04/26/23 0500 04/27/23 0432  WBC 5.2 6.6 7.7 4.9 4.6 4.3  NEUTROABS 3.8 5.5 5.7  --  4.2 3.4  HGB 13.0 13.4 13.3 13.1 12.9* 12.4*  HCT 38.9* 39.8 39.6 40.5 40.0 38.3*  MCV 89.8 89.2 90.2 92.7  93.0 92.3  PLT 135* 148* 134* 142* 149* 140*    Coagulation Studies: No results for input(s): "LABPROT", "INR" in the last 72 hours.   HgbA1c:  Lab Results  Component Value Date   HGBA1C 5.2 04/09/2023   INR  Lab Results  Component Value Date   INR 1.2 04/07/2023   APTT  Lab Results  Component Value Date   APTT 28 07/29/2022   CT Head without contrast(Personally reviewed): CTH was negative for a large hypodensity concerning for a large territory infarct or hyperdensity concerning for an ICH   MRI Brain(Personally reviewed): 1. Marked interval decrease in size of right parietal mass/lymphoma, now measuring 2.9 x 3.5 x 2.3 cm (previously 5.4 x 3.6 x 4.4 cm), consistent with interval response to therapy. Persistent but improved vasogenic edema within the adjacent posterior right cerebral hemisphere, with interval resolution of previously seen right-to-left midline shift. 2. 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 Correlation with symptomatology and EEG recommended. Evolving changes of subacute ischemia would be the primary differential consideration, but are felt to be less likely.   Neurodiagnostics rEEG:  This limited ceribell EEG is is suggestive of severe diffuse encephalopathy. No seizures were seen throughout the recording.   If concern of ictal-interictal activity persists,  conventional video EEG can be considered.    Assessment   Tyler [name] is a 73 y.o. male with hx of afib not on anticoagulation due to bleeding risk, prostate cancers s/p prostatectomy in 2021, PE, hep B exposure, CNS relapse of large B-cell lymphoma, partial seizure who is admitted for inpatient chemo. He was intubated and in the ICU for neck swelling, difficulty breathing, left facial twitching and noted to be obtunded and sedated.   He had limited cerebral EEG with no noted seizure.  However, he had MRI brain which is concerning for right insular and right frontal diffusion restriction most concerning for a focal seizure.  The MRI findings do raise high suspicion for persistent ictal/interictal activity and subclinical seizures that are not captured on cerebral EEG.  I would advise that patient be transferred to [facility] for continuous EEG to rule out subclinical seizures.  It would be hard to define a clear endpoint for AED escalation in this case without putting patient up on continuous EEG. We can certainly try to clinically define end point for AED escalation, if patient cannot get his chemo regimen at [facility].  Low grade fevers of unclear etiology, appreciate management per primary team  Recommendations  - long term EEG ordered *** - Keppra 1000 mg BID is essentially max for his CrCl (77) but if tolerating well could consider increase to 1500 mg BID   ______________________________________________________________________   Signed, Gordy Councilman, MD [facility]

## 2023-04-27 NOTE — Progress Notes (Signed)
     Meets extubation criteira Followed commands No visible seizure CXR - LLL atelectasis v consolidation - small and stble  A Meets etubation criteira  Plan  - extubate (RT already did it)  - neuro updated - might still need 4N transfer for c-EENG - awaiting to hear      SIGNATURE    Dr. Kalman Shan, M.D., F.C.C.P,  Pulmonary and Critical Care Medicine Staff Physician, [facility] Director - Interstitial Lung Disease  Program  Pulmonary Fibrosis [facility] at [facility] [place], [place], [address]   Pager: [phone], If no answer  -> Check AMION or Try [phone] Telephone (clinical office): [phone] Telephone (research): [phone]  11:30 AM 04/27/2023

## 2023-04-28 ENCOUNTER — Other Ambulatory Visit: Payer: Self-pay

## 2023-04-28 ENCOUNTER — Inpatient Hospital Stay (HOSPITAL_COMMUNITY)

## 2023-04-28 DIAGNOSIS — G40109 Localization-related (focal) (partial) symptomatic epilepsy and epileptic syndromes with simple partial seizures, not intractable, without status epilepticus: Secondary | ICD-10-CM

## 2023-04-28 DIAGNOSIS — R569 Unspecified convulsions: Secondary | ICD-10-CM | POA: Diagnosis not present

## 2023-04-28 DIAGNOSIS — I10 Essential (primary) hypertension: Secondary | ICD-10-CM | POA: Diagnosis not present

## 2023-04-28 DIAGNOSIS — J9600 Acute respiratory failure, unspecified whether with hypoxia or hypercapnia: Secondary | ICD-10-CM | POA: Diagnosis not present

## 2023-04-28 DIAGNOSIS — D638 Anemia in other chronic diseases classified elsewhere: Secondary | ICD-10-CM

## 2023-04-28 DIAGNOSIS — G40909 Epilepsy, unspecified, not intractable, without status epilepticus: Secondary | ICD-10-CM | POA: Diagnosis not present

## 2023-04-28 DIAGNOSIS — C8339 Primary central nervous system lymphoma: Secondary | ICD-10-CM | POA: Diagnosis not present

## 2023-04-28 LAB — COMPREHENSIVE METABOLIC PANEL
ALT: 34 U/L (ref 0–44)
AST: 21 U/L (ref 15–41)
Albumin: 2.7 g/dL — ABNORMAL LOW (ref 3.5–5.0)
Alkaline Phosphatase: 51 U/L (ref 38–126)
Anion gap: 9 (ref 5–15)
BUN: 18 mg/dL (ref 8–23)
CO2: 29 mmol/L (ref 22–32)
Calcium: 8.2 mg/dL — ABNORMAL LOW (ref 8.9–10.3)
Chloride: 100 mmol/L (ref 98–111)
Creatinine, Ser: 1.07 mg/dL (ref 0.61–1.24)
GFR, Estimated: >60 mL/min (ref >60)
Glucose, Bld: 105 mg/dL — ABNORMAL HIGH (ref 70–99)
Potassium: 4.2 mmol/L (ref 3.5–5.1)
Sodium: 138 mmol/L (ref 135–145)
Total Bilirubin: 1.1 mg/dL (ref 0.0–1.2)
Total Protein: 5.1 g/dL — ABNORMAL LOW (ref 6.5–8.1)

## 2023-04-28 LAB — MAGNESIUM
Magnesium: 1.9 mg/dL (ref 1.7–2.4)
Magnesium: 1.9 mg/dL (ref 1.7–2.4)

## 2023-04-28 LAB — GLUCOSE, CAPILLARY
Glucose-Capillary: 106 mg/dL — ABNORMAL HIGH (ref 70–99)
Glucose-Capillary: 100 mg/dL — ABNORMAL HIGH (ref 70–99)
Glucose-Capillary: 96 mg/dL (ref 70–99)
Glucose-Capillary: 149 mg/dL — ABNORMAL HIGH (ref 70–99)
Glucose-Capillary: 130 mg/dL — ABNORMAL HIGH (ref 70–99)
Glucose-Capillary: 101 mg/dL — ABNORMAL HIGH (ref 70–99)

## 2023-04-28 LAB — CULTURE, RESPIRATORY W GRAM STAIN: Gram Stain: NONE SEEN

## 2023-04-28 LAB — CBC
HCT: 40.3 % (ref 39.0–52.0)
Hemoglobin: 12.9 g/dL — ABNORMAL LOW (ref 13.0–17.0)
MCH: 30.1 pg (ref 26.0–34.0)
MCHC: 32 g/dL (ref 30.0–36.0)
MCV: 93.9 fL (ref 80.0–100.0)
Platelets: 138 10*3/uL — ABNORMAL LOW (ref 150–400)
RBC: 4.29 MIL/uL (ref 4.22–5.81)
RDW: 13.8 % (ref 11.5–15.5)
WBC: 2.5 10*3/uL — ABNORMAL LOW (ref 4.0–10.5)
nRBC: 0 % (ref 0.0–0.2)

## 2023-04-28 LAB — PHOSPHORUS
Phosphorus: 3.2 mg/dL (ref 2.5–4.6)
Phosphorus: 2.7 mg/dL (ref 2.5–4.6)

## 2023-04-28 MED ORDER — SODIUM CHLORIDE 0.9 % IV SOLN
200.0000 mg | Freq: Once | INTRAVENOUS | Status: AC
  Administered 2023-04-28: 200 mg via INTRAVENOUS
  Filled 2023-04-28: qty 20

## 2023-04-28 MED ORDER — VITAL 1.5 CAL PO LIQD
1000.0000 mL | ORAL | Status: DC
  Administered 2023-04-28 – 2023-04-29 (×2): 1000 mL

## 2023-04-28 MED ORDER — CHLORHEXIDINE GLUCONATE CLOTH 2 % EX PADS
6.0000 | MEDICATED_PAD | Freq: Every evening | CUTANEOUS | Status: DC
  Administered 2023-04-28 – 2023-05-22 (×27): 6 via TOPICAL

## 2023-04-28 MED ORDER — HYDRALAZINE HCL 50 MG PO TABS
50.0000 mg | ORAL_TABLET | Freq: Three times a day (TID) | ORAL | Status: DC
  Administered 2023-04-28 – 2023-05-03 (×15): 50 mg
  Filled 2023-04-28 (×15): qty 1

## 2023-04-28 MED ORDER — VITAMIN B-12 1000 MCG PO TABS
1000.0000 ug | ORAL_TABLET | Freq: Every day | ORAL | Status: DC
  Administered 2023-04-28 – 2023-05-22 (×24): 1000 ug
  Filled 2023-04-28 (×25): qty 1

## 2023-04-28 MED ORDER — VITAL HIGH PROTEIN PO LIQD: 1000.0000 mL | ORAL | Status: DC

## 2023-04-28 MED ORDER — PROSOURCE TF20 ENFIT COMPATIBL EN LIQD: 60.0000 mL | Freq: Every day | ENTERAL | Status: DC

## 2023-04-28 MED ORDER — LEVETIRACETAM 100 MG/ML PO SOLN
1500.0000 mg | Freq: Two times a day (BID) | ORAL | Status: DC
  Administered 2023-04-28 – 2023-04-29 (×2): 1500 mg
  Filled 2023-04-28 (×2): qty 15

## 2023-04-28 MED ORDER — LORAZEPAM 2 MG/ML IJ SOLN
2.0000 mg | INTRAMUSCULAR | Status: DC | PRN
  Administered 2023-05-04: 2 mg via INTRAVENOUS
  Filled 2023-04-28 (×3): qty 1

## 2023-04-28 MED ORDER — LACOSAMIDE 200 MG/20ML IV SOLN
50.0000 mg | Freq: Two times a day (BID) | INTRAVENOUS | Status: DC
  Administered 2023-04-28 – 2023-04-29 (×2): 50 mg via INTRAVENOUS
  Filled 2023-04-28 (×3): qty 5

## 2023-04-28 MED ORDER — BOOST / RESOURCE BREEZE PO LIQD CUSTOM
1.0000 | Freq: Three times a day (TID) | ORAL | Status: DC
Start: 2023-04-28 — End: 2023-04-28

## 2023-04-28 MED ORDER — PROSOURCE TF20 ENFIT COMPATIBL EN LIQD
60.0000 mL | Freq: Two times a day (BID) | ENTERAL | Status: DC
  Administered 2023-04-28 – 2023-05-22 (×46): 60 mL
  Filled 2023-04-28 (×47): qty 60

## 2023-04-28 MED ORDER — ENSURE ENLIVE PO LIQD
237.0000 mL | Freq: Two times a day (BID) | ORAL | Status: DC
  Administered 2023-04-29: 237 mL via ORAL

## 2023-04-28 NOTE — Progress Notes (Signed)
 Pt was given his clear liquid try.  He is very impulsive and will not slow down, even with prompting.  He coughed quite a bit when drinking anything so I have made him NPO until SLP can further evaluate. Tyler [name] C

## 2023-04-28 NOTE — Procedures (Addendum)
 Patient Name: Tyler [name]  MRN: [id number]  Epilepsy Attending: Charlsie Quest  Referring Physician/Provider: Gordy Councilman, MD  Duration: 04/27/2023 1608 to 04/28/2023 1608  Patient history: 73yo M patient with 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 EEG to evaluate for seizure   Level of alertness: Awake/lethargic  AEDs during EEG study: LEV, LCM  Technical aspects: This EEG study was done with scalp electrodes positioned according to the 10-20 International system of electrode placement. Electrical activity was reviewed with band pass filter of 1-70Hz , sensitivity of 7 uV/mm, display speed of 64mm/sec with a 60Hz  notched filter applied as appropriate. EEG data were recorded continuously and digitally stored.  Video monitoring was available and reviewed as appropriate.  Description: No clear posterior dominant rhythm was seen.  EEG showed continuous generalized and maximal right parietal 3 to 6 Hz theta-delta slowing. Lateralized periodic discharges were noted in right hemisphere, maximal right parietal region at 1 Hz, at times with overlying rhythmicity. Seizures without clinical signs were noted arising from right parietal region, average 5 seizures per hour, lasting 1 to 2 minutes each.  During seizure EEG showed 5 to 6 Hz theta slowing admixed with sharp waves and right parietal region which then involved all of right hemisphere and evolved into 2 to 3 Hz delta slowing.  IV Vimpat was administered on 04/28/2023 at around 1042. Gradually after around 1300, EEG improved and showed lateralized periodic discharges in right hemisphere, maximal right parietal region at 1hz  with overlying rhythmicity.  Hyperventilation and photic stimulation were not performed.     ABNORMALITY -Seizure without clinical signs, right parietal region -Lateralized periodic discharges with overlying rhythmicity, right hemisphere, maximal right parietal  region ( LPD+R) -Continuous slow, generalized and  maximal right parietal region  IMPRESSION: This study showed seizures without clinical signs arising from right parietal region, average 5 seizures per hour lasting 1 to 2 minutes each.  IV Vimpat was administered on 04/28/2023 at around 1042.  Gradually after around 1300, EEG improved and no definite seizures were noted.  Additionally there is evidence of epileptogenicity and cortical dysfunction arising from right hemisphere, maximal right parietal region likely secondary to underlying structural abnormality.  Lastly there is moderate diffuse encephalopathy.   Caidan Hubbert [name]

## 2023-04-28 NOTE — Plan of Care (Signed)
  Problem: SLP Dysphagia Goals Goal: Patient will demonstrate readiness for PO's Description: Patient will demonstrate readiness for PO's and/or instrumental swallow study as evidenced by: Flowsheets (Taken 04/28/2023 0955) Patient will demonstrate readiness for PO's and/or instrumental swallow study as evidenced by:  with min assist  initiation of swallow sequence Note: Pt will demonstrate improved oral prep and transit of puree trials with SLP direction.  Goal: Misc Dysphagia Goal Flowsheets (Taken 04/28/2023 0955) Misc Dysphagia Goal: Pt will tolerate thin liquids (no straw) without overt or subtle s/s of aspiration or concern for pulmonary infection.

## 2023-04-28 NOTE — Evaluation (Signed)
 Clinical/Bedside Swallow Evaluation Patient Details  Name: Tyler [name] MRN: [id number] Date of Birth: [birth year]  Today's Date: 04/28/2023 Time: SLP Start Time (ACUTE ONLY): 0735 SLP Stop Time (ACUTE ONLY): 0755 SLP Time Calculation (min) (ACUTE ONLY): 20 min  Past Medical History:  Past Medical History:  Diagnosis Date   Cancer (HCC)    prostate   Follicular lymphoma (HCC) 2024   History of pulmonary embolism    Hypertension    Myelodysplastic syndrome (HCC)    PAF (paroxysmal atrial fibrillation) (HCC)    Recurrent UTI    Past Surgical History:  Past Surgical History:  Procedure Laterality Date   APPLICATION OF CRANIAL NAVIGATION Right 03/10/2023   Procedure: APPLICATION OF CRANIAL NAVIGATION;  Surgeon: Tressie Stalker, MD;  Location: [facility] OR;  Service: Neurosurgery;  Laterality: Right;   BIOPSY  08/21/2022   Procedure: BIOPSY;  Surgeon: Beverley Fiedler, MD;  Location: [facility] ENDOSCOPY;  Service: Gastroenterology;;   CRANIOTOMY Right 03/10/2023   Procedure: PARIETAL CRANIOTOMY;  Surgeon: Tressie Stalker, MD;  Location: [facility] OR;  Service: Neurosurgery;  Laterality: Right;   ESOPHAGOGASTRODUODENOSCOPY (EGD) WITH PROPOFOL N/A 08/21/2022   Procedure: ESOPHAGOGASTRODUODENOSCOPY (EGD) WITH PROPOFOL;  Surgeon: Beverley Fiedler, MD;  Location: [facility] ENDOSCOPY;  Service: Gastroenterology;  Laterality: N/A;   IR IMAGING GUIDED PORT INSERTION  08/26/2022   LYMPHADENECTOMY Bilateral 09/01/2019   Procedure: LYMPHADENECTOMY;  Surgeon: Sebastian Ache, MD;  Location: [facility] ORS;  Service: Urology;  Laterality: Bilateral;   ROBOT ASSISTED LAPAROSCOPIC RADICAL PROSTATECTOMY N/A 09/01/2019   Procedure: XI ROBOTIC ASSISTED LAPAROSCOPIC RADICAL PROSTATECTOMY;  Surgeon: Sebastian Ache, MD;  Location: [facility] ORS;  Service: Urology;  Laterality: N/A;  3 HRS   HPI:  73 yo male presents to [facility] on 2/10 with LUE and LE weakness, and change in mentation. Head CT of brain shows a recurrent tumor at the craniotomy site and increased  size of recurrent tumor along with a left midline shift up to 6 mm. 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 Pt's swallowing was evaluated when he was admitted in June of 2024 - he was D/Cd on a dysphagia 3 diet with thin liquids with no f/u recommended. Pt's swallowing assessed again on 04/08/23 this admission with recommendation of a regular diet and thin liquids. Hospital course complicated by concern for neck swelling, acute dysphagia, and concern for airway protection prompting intubation from 02/28-03/02. Neck CT on 04/26/23: demonstrated "No acute finding. Assessment of mucosal surfaces limited by airway  collapse in the setting of intubation.".    Assessment / Plan / Recommendation  Clinical Impression   Pt presents with at least a moderate oral dysphagia and concerns for a pharyngeal dysphagia per clinical swallow assessment completed today.   Oral deficits characterized by poor labial seal and closure resulting in anterior loss of liquids at midline. Pt seemed unaware of bolus in mouth, which appeared to lead to oral holding and more significant anterior loss when pt would open mouth to speak prior to swallowing liquids. Similar deficits and behavior observed with applesauce trial.   Concerns for pharyngeal dysphagia include reduced hyolaryngeal elevation/excursion to palpation and suspected delayed swallow initiation to palpation. Pt demonstrated increased wet vocal quality, delayed wet throat clearing and delayed wet, non-productive coughing following thin  liquids and applesauce.   Discussed recommendation to proceed with MBSS prior to  diet advancement with pt and his daughter r(via phone).  Pt and daughter in agreement with plan, though pt is anxious to drink water.    MBSS anticipated later this morning. Further recommendations to follow.   SLP requested speech-language orders given concerns for apraxia of speech vs dysarthria vs mixed. There is also a language barrier. Recommend completing SLE with family present if possible.   SLP Visit Diagnosis: Dysphagia, unspecified (R13.10)    Aspiration Risk  Moderate aspiration risk    Diet Recommendation NPO;Alternative means - temporary;Ice chips PRN after oral care    Medication Administration: Via alternative means    Other  Recommendations Oral Care Recommendations: Oral care QID    Recommendations for follow up therapy are one component of a multi-disciplinary discharge planning process, led by the attending physician.  Recommendations may be updated based on patient status, additional functional criteria and insurance authorization.  Follow up Recommendations Follow physician's recommendations for discharge plan and follow up therapies (SLP services at next level of care)      Assistance Recommended at Discharge  TBD  Functional Status Assessment Patient has had a recent decline in their functional status and demonstrates the ability to make significant improvements in function in a reasonable and predictable amount of time.  Frequency and Duration min 1 x/week  1 week       Prognosis Prognosis for improved oropharyngeal function: Fair      Swallow Study   General Date of Onset: 04/07/23 HPI: 73 yo male presents to [facility] on 2/10 with LUE and LE weakness, and change in mentation. Head CT of brain shows a recurrent tumor at the craniotomy site and increased size of recurrent tumor along with a left midline shift up to 6 mm. 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 Pt's  swallowing was evaluated when he was admitted in June of 2024 - he was D/Cd on a dysphagia 3 diet with thin liquids with no f/u recommended. Pt's swallowing assessed again on 04/08/23 this admission with recommendation of a regular diet and thin liquids. Hospital course complicated by concern for neck swelling, acute dysphagia, and concern for airway protection prompting intubation from 02/28-03/02. Neck CT on 04/26/23: demonstrated "No acute finding. Assessment of mucosal surfaces limited by airway  collapse in the setting of intubation.". Type of Study: Bedside Swallow Evaluation Previous Swallow Assessment: 04/08/23 - WNL Diet Prior to this Study: NPO;Cortrak/Small bore NG tube Temperature Spikes Noted: Yes Respiratory Status: Nasal cannula (2L) History of Recent Intubation: Yes Total duration of intubation (days): 2 days Date extubated: 04/27/23 Behavior/Cognition: Alert;Cooperative Oral Cavity Assessment:  (generalized reduced oral movements or hesitations) Oral Cavity - Dentition: Adequate natural dentition Vision: Functional for self-feeding Self-Feeding Abilities: Needs set up Patient Positioning: Upright in bed Baseline Vocal Quality: Wet (dysarthric speech) Volitional Cough:  (fair)    Oral/Motor/Sensory Function Overall Oral Motor/Sensory Function: Moderate impairment (further assessment needed) Facial ROM:  (generalized reduced oral opening and reduced coordination vs reduced strength vs mixed - concern for oral apraxia)   Ice Chips Ice chips: Not tested (pt declined)   Thin Liquid Thin Liquid: Impaired Presentation: Straw;Cup Oral Phase Impairments: Reduced labial seal;Poor awareness of bolus Oral Phase Functional Implications: Left anterior spillage;Right anterior spillage;Prolonged oral transit;Oral holding Pharyngeal  Phase Impairments: Suspected delayed Swallow;Decreased hyoid-laryngeal movement;Cough - Delayed;Wet Vocal  Quality    Nectar Thick Nectar Thick Liquid: Not tested    Honey Thick Honey Thick Liquid: Not tested   Puree Puree: Impaired Presentation: Spoon Oral Phase Impairments: Reduced labial seal;Poor awareness of bolus Oral Phase Functional Implications: Oral residue Pharyngeal Phase Impairments: Decreased hyoid-laryngeal movement;Wet Vocal Quality;Throat Clearing - Delayed   Solid     Solid: Not tested      [name] 04/28/2023,8:31 AM

## 2023-04-28 NOTE — Evaluation (Signed)
 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/2022   Acute renal failure with tubular necrosis (HCC) 08/08/2022   Aspiration into airway 08/08/2022   At high risk of tumor lysis syndrome 08/07/2022   Anemia 08/06/2022   Diffuse large B-cell lymphoma of lymph nodes of multiple regions (HCC) 07/25/2022   Encounter for antineoplastic chemotherapy 07/25/2022   Lumbar radiculopathy 05/31/2022   Symptomatic anemia    UTI (urinary tract infection) 12/10/2021   Septic shock (HCC) 12/10/2021   Normocytic anemia 12/10/2021   Thrombocytopenia (HCC) 12/10/2021   Hyponatremia 12/10/2021   Sepsis secondary to UTI (HCC) 12/10/2021   Pelvic hematoma in male 09/08/2019   Bilateral pulmonary embolism (HCC) 09/05/2019   Paroxysmal atrial fibrillation (HCC) 09/05/2019   Hypertension     Hypokalemia    Prostate cancer (HCC) 09/01/2019   Past Medical History:  Past Medical History:  Diagnosis Date   Cancer (HCC)    prostate   Follicular lymphoma (HCC) 2024   History of pulmonary embolism    Hypertension    Myelodysplastic syndrome (HCC)    PAF (paroxysmal atrial fibrillation) (HCC)    Recurrent UTI    Past Surgical History:  Past Surgical History:  Procedure Laterality Date   APPLICATION OF CRANIAL NAVIGATION Right 03/10/2023   Procedure: APPLICATION OF CRANIAL NAVIGATION;  Surgeon: Tressie Stalker, MD;  Location: [facility] OR;  Service: Neurosurgery;  Laterality: Right;   BIOPSY  08/21/2022   Procedure: BIOPSY;  Surgeon: Beverley Fiedler, MD;  Location: [facility] ENDOSCOPY;  Service: Gastroenterology;;   CRANIOTOMY Right 03/10/2023   Procedure: PARIETAL CRANIOTOMY;  Surgeon: Tressie Stalker, MD;  Location: [facility] OR;  Service: Neurosurgery;  Laterality: Right;   ESOPHAGOGASTRODUODENOSCOPY (EGD) WITH PROPOFOL N/A 08/21/2022   Procedure: ESOPHAGOGASTRODUODENOSCOPY (EGD) WITH PROPOFOL;  Surgeon: Beverley Fiedler, MD;  Location: [facility] ENDOSCOPY;  Service: Gastroenterology;  Laterality: N/A;   IR IMAGING GUIDED PORT INSERTION  08/26/2022   LYMPHADENECTOMY Bilateral 09/01/2019   Procedure: LYMPHADENECTOMY;  Surgeon: Sebastian Ache, MD;  Location: [facility] ORS;  Service: Urology;  Laterality: Bilateral;   ROBOT ASSISTED LAPAROSCOPIC RADICAL PROSTATECTOMY N/A 09/01/2019   Procedure: XI ROBOTIC ASSISTED LAPAROSCOPIC RADICAL PROSTATECTOMY;  Surgeon: Sebastian Ache, MD;  Location: [facility] ORS;  Service: Urology;  Laterality: N/A;  3 HRS   HPI:  73 yo male presents to [facility] on 2/10 with LUE and LE weakness, and change in mentation. Head CT of brain shows a recurrent tumor at the craniotomy site and  increased size of recurrent tumor along with a left midline shift up to 6 mm. 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 Pt's swallowing was evaluated when he was admitted in June of 2024 - he was D/Cd on a dysphagia 3 diet with thin liquids with no f/u recommended. Pt's swallowing assessed again on 04/08/23 this admission with recommendation of a regular diet and thin liquids. Hospital course complicated by concern for neck swelling, acute dysphagia, and concern for airway protection prompting intubation from 02/28-03/02. Neck CT on 04/26/23: demonstrated "No acute finding. Assessment of mucosal surfaces limited by airway  collapse in the setting of intubation.".   Assessment / Plan / Recommendation Clinical Impression   Speech/Language Pt presents with nonfluent, mixed expressive and receptive language deficits consistent with aphasia in the setting of frequent and ongoing seizures vs brain tumor. Pt's daughter available and provided translation with pt via phone. She reported that speech and swallowing concerns started on Friday, otherwise, he has not had any concerns for speech or swallowing during his cancer treatment. Daughter reported that pt's running speech is difficult to understand, even in native language. She reported delayed response times or no responses despite verbal engagement with him. SLP observed some minimal s/s of possible oral groping, concerning for an apraxic component. He was able to state first and last name given max verbal prompts and additional processing time. Otherwise, pt did not offer responses to simple questions (automatic sequences) or follow simple commands. He occasionally attempts to express wants and needs, though appeared to speak in mixture of English and Swahili, although, some responses may have been garbled or nonsensical attempts since his daughter is also having difficulty understanding him .   Swallowing: SLP provided follow up education with pt and daughter  re: MBSS results and recommendations. Outlined treatment plan for swallowing. Pt attempting to drink orange juice with lid that has a small opening. He continued to exhibit significant anterior spillage of liquid despite small cup opening. No overt or subtle s/s of aspiration were observed.   SLP will continue to follow to address speech, language, and swallowing goals.     SLP Assessment  SLP Recommendation/Assessment: Patient needs continued Speech Lanaguage Pathology Services SLP Visit Diagnosis: Dysphagia, oropharyngeal phase (R13.12);Apraxia (R48.2);Aphasia (R47.01);Dysarthria and anarthria (R47.1);Attention and concentration deficit    Recommendations for follow up therapy are one component of a multi-disciplinary discharge planning process, led by the attending physician.  Recommendations may be updated based on patient status, additional functional criteria and insurance authorization.    Follow Up Recommendations  Follow physician's recommendations for discharge plan and follow up therapies (SLP services at next level of care)    Assistance Recommended at Discharge   TBD  Functional Status Assessment Patient has had a recent decline in their functional status and demonstrates the ability to make significant improvements in function in a reasonable and predictable amount of time.  Frequency and Duration min 2x/week  2 weeks      SLP Evaluation Cognition  Overall Cognitive Status: Impaired/Different from baseline Arousal/Alertness: Awake/alert Orientation Level: Oriented to person Attention: Focused Focused Attention: Impaired Focused Attention Impairment: Verbal basic Awareness: Impaired Awareness Impairment: Intellectual impairment Safety/Judgment: Impaired       Comprehension  Auditory Comprehension Overall Auditory Comprehension: Impaired Commands: Impaired One Step Basic Commands: 0-24% accurate Conversation:  Simple Interfering Components: Attention;Processing  speed;Motor planning EffectiveTechniques: Repetition Visual Recognition/Discrimination Discrimination: Not tested Reading Comprehension Reading Status: Not tested    Expression Expression Primary Mode of Expression: Verbal Verbal Expression Overall Verbal Expression: Impaired Initiation: Impaired Automatic Speech: Name Level of Generative/Spontaneous Verbalization: Phrase;Sentence (when speaking with daughter only) Pragmatics: Impairment Impairments: Abnormal affect Interfering Components: Attention Non-Verbal Means of Communication: Not applicable Written Expression Written Expression: Not tested   Oral / Motor  Oral Motor/Sensory Function Overall Oral Motor/Sensory Function: Moderate impairment (pt with limited ability to compelte OME; generalized reduced oral movements and control) Facial ROM:  (generalized reduced oral opening and reduced coordination vs reduced strength vs mixed - concern for oral apraxia)            [name] 04/28/2023, 11:20 AM

## 2023-04-28 NOTE — Progress Notes (Signed)
 Subjective: Had multiple subclinical seizures overnight.  ROS: Unable to obtain due to poor mental status  Examination  Vital signs in last 24 hours: Temp:  [98.6 F (37 C)-102.2 F (39 C)] 98.6 F (37 C) (03/03 1100) Pulse Rate:  [68-96] 75 (03/03 1203) Resp:  [16-28] 23 (03/03 1000) BP: (111-183)/(70-141) 134/83 (03/03 1203) SpO2:  [94 %-100 %] 99 % (03/03 1203) Weight:  [89.2 kg] 89.2 kg (03/03 0500)  General: lying in bed, NAD Neuro: Opens eyes to verbal and tactile stimulation, looks at examiner, does not answer questions, does not follow commands, does appear to be tracking examiner in room and pupils appear equally round and reactive, antigravity strength in all 4 extremities  Basic Metabolic Panel: Recent Labs  Lab 04/22/23 1546 04/23/23 0501 04/25/23 0528 04/25/23 2245 04/26/23 0500 04/26/23 1605 04/27/23 0432 04/28/23 1120  NA 135   < > 138 139 138  --  140 138  K 3.4*   < > 3.4* 3.6 3.3*  --  3.6 4.2  CL 103   < > 94* 93* 97*  --  104 100  CO2 21*   < > 36* 34* 30  --  28 29  GLUCOSE 130*   < > 101* 106* 149*  --  112* 105*  BUN 23   < > 24* 21 21  --  26* 18  CREATININE 0.90   < > 1.18 1.26* 1.08  --  0.93 1.07  CALCIUM 8.4*   < > 8.1* 8.3* 8.0*  --  8.0* 8.2*  MG 1.8  --   --   --   --  2.1 2.0 1.9  PHOS 3.0  --   --   --   --  3.7 2.9 3.2   < > = values in this interval not displayed.    CBC: Recent Labs  Lab 04/23/23 0501 04/24/23 0611 04/25/23 0528 04/25/23 2245 04/26/23 0500 04/27/23 0432 04/28/23 0352  WBC 5.2 6.6 7.7 4.9 4.6 4.3 2.5*  NEUTROABS 3.8 5.5 5.7  --  4.2 3.4  --   HGB 13.0 13.4 13.3 13.1 12.9* 12.4* 12.9*  HCT 38.9* 39.8 39.6 40.5 40.0 38.3* 40.3  MCV 89.8 89.2 90.2 92.7 93.0 92.3 93.9  PLT 135* 148* 134* 142* 149* 140* 138*     Coagulation Studies: No results for input(s): "LABPROT", "INR" in the last 72 hours.  Imaging personally reviewed CT head without contrast 04/26/2023:  Cortical edema at the right insula  correlating with abnormality on prior brain MRI. Small area of blood products in the parasagittal right parietal lobe with adjacent swelling, operative site. No evidence of interval infarct, hydrocephalus, or collection.  MRI brain with and without contrast 04/25/2023:  1. Marked interval decrease in size of right parietal mass/lymphoma,now measuring 2.9 x 3.5 x 2.3 cm (previously 5.4 x 3.6 x 4.4 cm), consistent with interval response to therapy. Persistent but improved vasogenic edema within the adjacent posterior right cerebral hemisphere, with interval resolution of previously seen right-to-left midline shift. 2. 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 Correlation with symptomatology and EEG recommended. Evolving changes of subacute ischemia would be the primary differential consideration, but are felt to be less likely.   ASSESSMENT AND PLAN: 73 year old male with history of A-fib not on anticoagulation due to bleeding risk, prostate cancer status post prostatectomy, PE, hep B exposure, CNS recent labs of large B-cell lymphoma, focal seizures on Keppra  who was admitted for inpatient chemo and was noted to have left facial twitching consistent with focal seizures.  Epilepsy with breakthrough seizure Cerebral edema B-cell lymphoma -In the setting of underlying lymphoma and edema  Recommendations -Due to multiple seizures overnight, will give Vimpat 200 mg once and start on 50 mg twice daily -Will increase Keppra to 1500 mg twice daily -Continue video EEG to monitor for intermittent seizures -If any further seizures, can increase Vimpat -Consider perampanel as next antiseizure medication if seizures persist -Discussed plan with Dr. Everardo All via secure chat -Continue seizure precautions -As needed IV Ativan for clinical seizures  I have spent a total of  40  minutes with the patient reviewing  hospital notes,  test results, labs and examining the patient as well as establishing an assessment and plan.  > 50% of time was spent in direct patient care.        Lindie Spruce Epilepsy [facility] For questions after 5pm please refer to AMION to reach the Neurologist on call

## 2023-04-28 NOTE — Evaluation (Signed)
 Physical Therapy Evaluation Patient Details Name: Tyler [name] MRN: [id number] DOB: Sep 26, 1950 Today's Date: 04/28/2023  History of Present Illness  73 yo male presents on 2/25 for second cycle of high dose methotrexate with leucovorin rescue. 2/27 MRI showed marked interval decrease in size of right parietal mass/lymphoma and New diffusion signal abnormality involving the right insular  cortex and overlying right frontal lobe, favored to reflect sequelae of seizure. 2/28 transferred to ICU and intubated. 3/2 extubated. Significant PMH: CNS relapse of large B-cell lymphoma, partial seizure, paroxysmal atrial fibrillation not on anticoagulation due to bleeding risk, prostate cancer status post prostatectomy in 2021, pulmonary embolism.  Clinical Impression  Pt evaluated on EEG. Pt alert, but demonstrates communication and cognitive deficits, with inconsistent command following and delayed response time. Pt appears to require increased assist with mobility due to impairments in cognition rather than physical ability. Pt requiring two person maximal assist for bed mobility. After dense multimodal cueing and significant time lapse, pt able to stand from edge of bed with minimal assist. Unable to laterally step and deferred further ambulation due to EEG connection. Patient will benefit from intensive inpatient follow-up therapy, >3 hours/day in order to address deficits, maximize functional mobility and decrease caregiver burden.       If plan is discharge home, recommend the following: A lot of help with walking and/or transfers;A lot of help with bathing/dressing/bathroom   Can travel by private vehicle   No    Equipment Recommendations Other (comment) (TBA)  Recommendations for Other Services  Rehab consult;OT consult    Functional Status Assessment Patient has had a recent decline in their functional status and demonstrates the ability to make significant improvements in function in a reasonable  and predictable amount of time.     Precautions / Restrictions Precautions Precautions: Fall;Other (comment) Recall of Precautions/Restrictions: Impaired Precaution/Restrictions Comments: NGT, EEG Restrictions Weight Bearing Restrictions Per Provider Order: No      Mobility  Bed Mobility Overal bed mobility: Needs Assistance Bed Mobility: Supine to Sit, Sit to Supine     Supine to sit: Max assist, +2 for physical assistance Sit to supine: Max assist, +2 for physical assistance   General bed mobility comments: Increased assist due to lack of initiation    Transfers Overall transfer level: Needs assistance Equipment used: None Transfers: Sit to/from Stand Sit to Stand: Min assist, +2 safety/equipment           General transfer comment: Pt not initiating stand despite dense multimodal cueing. Attempted to place chair in front of pt as visual cue to pull up to stand, however, ultimately stood after increased time with minA and no assistive device.    Ambulation/Gait               General Gait Details: unable due to EEG  Stairs            Wheelchair Mobility     Tilt Bed    Modified Rankin (Stroke Patients Only)       Balance Overall balance assessment: Needs assistance Sitting-balance support: Feet supported, No upper extremity supported Sitting balance-Leahy Scale: Fair Sitting balance - Comments: Progressing to supervision; initially trying to lie back down, requiring increased assist   Standing balance support: No upper extremity supported, During functional activity Standing balance-Leahy Scale: Fair                               Pertinent Vitals/Pain Pain Assessment  Pain Assessment: Faces Faces Pain Scale: No hurt    Home Living Family/patient expects to be discharged to:: Private residence Living Arrangements: Spouse/significant other Available Help at Discharge: Family Type of Home: Apartment Home Access: Level  entry Entrance Stairs-Rails: Right;Left Entrance Stairs-Number of Steps: none   Home Layout: One level Home Equipment: Agricultural consultant (2 wheels);BSC/3in1;Shower seat      Prior Function Prior Level of Function : Independent/Modified Independent             Mobility Comments: occasional RW use ADLs Comments: Independent to Mod I with occasional assistance from children     Extremity/Trunk Assessment   Upper Extremity Assessment Upper Extremity Assessment: Defer to OT evaluation    Lower Extremity Assessment Lower Extremity Assessment: RLE deficits/detail;LLE deficits/detail RLE Deficits / Details: Moves spontaneously LLE Deficits / Details: Moves spontaneously    Cervical / Trunk Assessment Cervical / Trunk Assessment: Normal  Communication   Communication Communication: Impaired Factors Affecting Communication: Reduced clarity of speech    Cognition Arousal: Alert Behavior During Therapy: Flat affect   PT - Cognitive impairments: Difficult to assess Difficult to assess due to: Impaired communication                     PT - Cognition Comments: Nonfluent speech alternating between [place] and Swahili. Dysarthric with delayed response time. Follows some gestural commands, limited verbal commands Following commands: Impaired Following commands impaired: Follows one step commands inconsistently, Follows one step commands with increased time     Cueing Cueing Techniques: Verbal cues, Gestural cues     General Comments      Exercises     Assessment/Plan    PT Assessment Patient needs continued PT services  PT Problem List Decreased strength;Decreased activity tolerance;Decreased mobility;Decreased balance;Decreased cognition;Decreased knowledge of use of DME;Decreased safety awareness;Decreased coordination;Impaired sensation       PT Treatment Interventions DME instruction;Gait training;Therapeutic exercise;Therapeutic activities;Functional  mobility training;Balance training;Neuromuscular re-education;Cognitive remediation;Patient/family education    PT Goals (Current goals can be found in the Care Plan section)  Acute Rehab PT Goals Patient Stated Goal: none stated PT Goal Formulation: With patient Time For Goal Achievement: 05/12/23 Potential to Achieve Goals: Fair    Frequency Min 3X/week     Co-evaluation               AM-PAC PT "6 Clicks" Mobility  Outcome Measure Help needed turning from your back to your side while in a flat bed without using bedrails?: A Little Help needed moving from lying on your back to sitting on the side of a flat bed without using bedrails?: A Lot Help needed moving to and from a bed to a chair (including a wheelchair)?: A Lot Help needed standing up from a chair using your arms (e.g., wheelchair or bedside chair)?: A Little Help needed to walk in hospital room?: A Lot Help needed climbing 3-5 steps with a railing? : Total 6 Click Score: 13    End of Session Equipment Utilized During Treatment: Gait belt Activity Tolerance: Patient tolerated treatment well Patient left: in bed;with call bell/phone within reach;with bed alarm set Nurse Communication: Mobility status PT Visit Diagnosis: Unsteadiness on feet (R26.81);Other abnormalities of gait and mobility (R26.89);Other symptoms and signs involving the nervous system (R29.898)    Time: 1610-9604 PT Time Calculation (min) (ACUTE ONLY): 31 min   Charges:   PT Evaluation $PT Eval Moderate Complexity: 1 Mod PT Treatments $Therapeutic Activity: 8-22 mins PT General Charges $$ ACUTE PT VISIT:  1 Visit         Lillia Pauls, PT, DPT Acute Rehabilitation Services Office [phone]   Norval Morton 04/28/2023, 1:44 PM

## 2023-04-28 NOTE — Progress Notes (Addendum)
 Nutrition Follow-up  DOCUMENTATION CODES:   Not applicable  INTERVENTION:   Tube feeding via Cortrak tube: Vital 1.5 at 20 ml/h and increase by 10 ml every 8 hours to goal rate of 65 ml/hr (1320 ml per day)  Prosource TF20 60 ml BID  Provides 2140 kcal, 129 gm protein, 1008 ml free water daily  Monitor magnesium and phosphorus every 12 hours x 4 occurrences, MD to replete as needed, as pt is at risk for refeeding syndrome.   RN giving miralax today   Clear liquids per SLP, ok for ensure but only able to tolerate thin liquids for now D/C Boost Breeze TID Ensure Enlive po BID, each supplement provides 350 kcal and 20 grams of protein.   NUTRITION DIAGNOSIS:   Inadequate oral intake related to inability to eat as evidenced by NPO status. Ongoing.   GOAL:   Patient will meet greater than or equal to 90% of their needs Progressing with TF advancement   MONITOR:   Vent status, Labs, Weight trends, TF tolerance  REASON FOR ASSESSMENT:   Consult Enteral/tube feeding initiation and management (trickles)  ASSESSMENT:   73 y.o. male with PMH prostate cancer s/p prostatectomy in 2021, pulmonary embolism, hepatitis B exposure, and high-grade large B-cell lymphoma and completed 6 cycles of R-CHOP (chemo) with systemic remission in the past. 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  Pt discussed during ICU rounds and with RN and MD. Pt extubated then had sz. Transferred to [facility] for LTM.  Pt awake and alert, aphasic during exam/eval.  Seen by SLP, pt started having swallowing issues Friday 2/28. Completed MBSS today 3/3, pt with moderate-severe oral dysphagia and mild pharyngeal dysphagia. Deficits were more pronounced with nectar, puree, and soft solids. Pt speaking some in [place] and [place].  No bm documented since 2/24, spoke with RN  2/25 - admitted  2/28 - intubated 3/1 -  s/p cortrak placement (at [facility]); tip gastric per xray 3/2 - extubated  Medications reviewed and include: Vitamin D3 2000 IU daily, Vitamin B12 1000 mcg daily, decadron, Boost Breeze TID, SSI every 4 hours, protonix  Labs reviewed:  K 4.2 Phos 3.2 Mag 1.9 CBG's: 73-149     NUTRITION - FOCUSED PHYSICAL EXAM:  Flowsheet Row Most Recent Value  Orbital Region No depletion  Upper Arm Region Mild depletion  Thoracic and Lumbar Region No depletion  Buccal Region No depletion  Temple Region Mild depletion  Clavicle Bone Region No depletion  Clavicle and Acromion Bone Region No depletion  Scapular Bone Region Unable to assess  Dorsal Hand Unable to assess  [edema]  Patellar Region Mild depletion  Anterior Thigh Region Mild depletion  Posterior Calf Region Mild depletion  Edema (RD Assessment) Mild  Hair Reviewed  Eyes Reviewed  Mouth Reviewed  Skin Reviewed  Nails Reviewed  [pale]       Diet Order:   Diet Order             Diet clear liquid Room service appropriate? Yes; Fluid consistency: Thin  Diet effective now                   EDUCATION NEEDS:   Not appropriate for education at this time  Skin:  Skin Assessment: Reviewed RN Assessment  Last BM:  2/24  Height:   Ht Readings from Last 1 Encounters:  04/23/23 5\' 7"  (1.702 m)    Weight:  Wt Readings from Last 1 Encounters:  04/28/23 89.2 kg    Ideal Body Weight:  67.27 kg  BMI:  Body mass index is 30.8 kg/m.  Estimated Nutritional Needs:   Kcal:  2100-2350 kcals  Protein:  115-135 grams  Fluid:  >/= 2.1L  Daron Stutz P., RD, LDN, CNSC See AMiON for contact information

## 2023-04-28 NOTE — Progress Notes (Cosign Needed)
 Tyler [name]   DOB:[birth year]   UJ#:[id number]      ASSESSMENT & PLAN:  1.  Partial sensory seizures, left side Status post rapid response on 04/25/2023 Altered mental status - Previously complained of left-sided paresthesia causing fingers to feel numb for a few seconds.  Had improved significantly to baseline last week.  However on 04/25/2023 while Dr. Candise Che while rounding in the evening, observed patient with likely partial seizure.  Rapid response was called and appropriate medications administered and patient intubated. - Patient was transferred from [facility] to [facility].  Continues on video EEG monitoring for intermittent seizures.  On seizure precautions. - Currently seen with no verbal response and slow cognition observed, although he nods his head when questioned. - On Keppra 1500 mg po bid, Vimpat 50 mg IV twice daily, and steroids.   - MRI brain done 04/24/2023 in the AM showed marked interval decrease in right parietal mass persistent with response to therapy.  However new diffusion signal abnormality involving right insular cortex and overlying right frontal favored to reflect sequelae of seizure. - Critical care and neuro teams following - Continue to monitor closely  2.  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   S/p chemotherapy R-CHOP.  S/p craniotomy with gross total resection 03/10/23.  - CT of head 04/07/2023 showed interval increase in size of tumor at the craniotomy site.  MRI of head 04/07/2023 increase in size of tumor with slightly increased surrounding edema. - Currently on Hi-dose methotrexate chemotherapy regimen which is more aggressive approach.  Cycle 2 chemo completed 04/23/2023. - Continue to monitor urine pH daily.  Urine pH 7.0 today - Monitor methotrexate level daily until <0.1.   Level 0.09 on 04/27/2023.  - Status post IV leucovorin rescue - On hold: Outpatient IV Rituxan between 2nd  and 3rd cycle of methotrexate and subsequently between treatment cycles until acute phase resolves. - Continue daily labs - Medical oncology/Dr. Candise Che following closely.     3.  Pancytopenia: Thrombocytopenia, Anemia, Leukopenia - Mild, likely due to recent chemotherapy treatment - platelets 138 k today - Hemoglobin 12.9, stable - WBC 2.5 today, ANC on 04/27/2023 stable - No intervention required at this time - Monitor CBC with differential    4.  Elevated blood glucose  - Monitor blood sugar levels closely.  105 today -Primary team following    5.  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  A-fib - S/p Eliquis, d/c'd due to high risk for bleeding  - Follow closely with cardiology   7.  Prostate cancer - Diagnosed 2021   8.  Hep B antibody positive, chronic - On tenofovir for hep B reactivation suppression in context of Rituxan use, continue via G-tube. - Plan is to continue for 6 months after his last chemotherapy. - Follow ID outpatient   9.  VTE prophylaxis - With Lovenox   10.  Fever - Temperature 98.6-100.9 today - May be related to seizure, meds, infection. - Management per critical care team - Monitor fever curve   Code Status Full  Subjective:  Patient seen resting quietly with eyes closed.  Awakens to tactile stimuli.  Slow response to questions by nodding his head, no verbal response.  NGT tube with feeding is ongoing, O2 via nasal cannula also intact.  Continuous EEG ongoing.  No significant neck swelling is noted at this  time.  No acute distress is noted.  Objective:  Vitals:   04/28/23 1203 04/28/23 1300  BP: 134/83 (!) 149/83  Pulse: 75 80  Resp:  18  Temp:  100 F (37.8 C)  SpO2: 99% 98%     Intake/Output Summary (Last 24 hours) at 04/28/2023 1453 Last data filed at 04/28/2023 1400 Gross per 24 hour  Intake 938.2 ml  Output 1085 ml  Net -146.8 ml     REVIEW OF SYSTEMS: Unable to obtain due  to status   PHYSICAL EXAMINATION: ECOG PERFORMANCE STATUS: 4 - Bedbound  Vitals:   04/28/23 1203 04/28/23 1300  BP: 134/83 (!) 149/83  Pulse: 75 80  Resp:  18  Temp:  100 F (37.8 C)  SpO2: 99% 98%   Filed Weights   04/26/23 0500 04/27/23 0500 04/28/23 0500  Weight: 184 lb 1.4 oz (83.5 kg) 188 lb 7.9 oz (85.5 kg) 196 lb 10.4 oz (89.2 kg)    GENERAL: alert, no distress and comfortable SKIN: skin color, texture, turgor are normal, no rashes or significant lesions EYES: normal, conjunctiva are pink and non-injected, sclera clear OROPHARYNX: no exudate, no erythema and lips, buccal mucosa, and tongue normal  NECK: supple, thyroid normal size, non-tender, without nodularity LYMPH: no palpable lymphadenopathy in the cervical, axillary or inguinal LUNGS: clear to auscultation and percussion with normal breathing effort HEART: regular rate & rhythm and no murmurs and no lower extremity edema ABDOMEN: abdomen soft, non-tender and normal bowel sounds MUSCULOSKELETAL: no cyanosis of digits and no clubbing  PSYCH: alert & oriented x 3 with fluent speech NEURO: no focal motor/sensory deficits   All questions were answered. The patient knows to call the clinic with any problems, questions or concerns.   The total time spent in the appointment was 40 minutes encounter with patient including review of chart and various tests results, discussions about plan of care and coordination of care plan  Dawson Bills, NP 04/28/2023 2:53 PM    Labs Reviewed:  Lab Results  Component Value Date   WBC 2.5 (L) 04/28/2023   HGB 12.9 (L) 04/28/2023   HCT 40.3 04/28/2023   MCV 93.9 04/28/2023   PLT 138 (L) 04/28/2023   Recent Labs    08/11/22 0316 08/11/22 1544 08/12/22 0515 08/12/22 1400 08/20/22 0302 08/21/22 0334 04/26/23 0500 04/27/23 0432 04/28/23 1120  NA 134*   < > 137   < > 137   < > 138 140 138  K 3.7   < > 3.3*   < > 4.0   < > 3.3* 3.6 4.2  CL 99   < > 101   < > 102   < > 97*  104 100  CO2 25   < > 28   < > 24   < > 30 28 29   GLUCOSE 185*   < > 114*   < > 78   < > 149* 112* 105*  BUN 52*   < > 41*   < > 45*   < > 21 26* 18  CREATININE 1.58*   < > 1.44*   < > 3.38*   < > 1.08 0.93 1.07  CALCIUM 7.4*   < > 7.5*   < > 7.2*   < > 8.0* 8.0* 8.2*  GFRNONAA 47*   < > 52*   < > 19*   < > >60 >60 >60  PROT 4.6*  --  4.2*  --  3.9*   < > 5.2*  4.9* 5.1*  ALBUMIN 1.8*  1.7*   < > 1.7*  1.6*   < > 1.7*   < > 2.9* 2.7* 2.7*  AST 33  --  31  --  25   < > 29 20 21   ALT 31  --  31  --  18   < > 44 33 34  ALKPHOS 84  --  79  --  111   < > 61 54 51  BILITOT 3.2*  --  2.5*  --  0.8   < > 1.3* 0.9 1.1  BILIDIR 1.7*  --  1.3*  --  0.2  --   --   --   --   IBILI 1.5*  --  1.2*  --  0.6  --   --   --   --    < > = values in this interval not displayed.    Studies Reviewed:  DG Swallowing Func-Speech Pathology Result Date: 04/28/2023 Table formatting from the original result was not included. Modified Barium Swallow Study Patient Details Name: Tyler [name] MRN: [id number] Date of Birth: 06/29/1950 Today's Date: 04/28/2023 HPI/PMH: HPI: 73 yo male presents to [facility] on 2/10 with LUE and LE weakness, and change in mentation. Head CT of brain shows a recurrent tumor at the craniotomy site and increased size of recurrent tumor along with a left midline shift up to 6 mm. 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 Pt's swallowing was evaluated when he was admitted in June of 2024 - he was D/Cd on a dysphagia 3 diet with thin liquids with no f/u recommended. Pt's swallowing assessed again on 04/08/23 this admission with recommendation of a regular diet and thin liquids. Hospital course complicated by concern for neck swelling, acute dysphagia, and concern for airway protection  prompting intubation from 02/28-03/02. Neck CT on 04/26/23: demonstrated "No acute finding. Assessment of mucosal surfaces limited by airway  collapse in the setting of intubation.". Clinical Impression: Pt presents with a moderate-severe oral dysphagia and a mild pharyngeal dysphagia per results of MBSS completed today. Oral deficits included poor bolus awareness, oral holding, reduced labial seal resulting in anterior spillage, poor lingual movement or initiation for oral transit, and reduced oral coordination resulting in posterior loss of trials. Oral deficits were more pronounced with nectar-thick liquid, puree, and soft solid trials. Soft solid trial was removed from oral cavity due to minimal-no mastication efforts.  Pt required multiple verbal and tactile cues to eventually initiate oral transit of nectar-thick and puree trials. Tactile cues included downward pressure of spoon on tongue multiple times. Pharyngeal dysphagia characterized by reduced hyolaryngeal elevation, brief mistiming of pharyngeal swallow initiation, reduced base of tongue retraction, reduced laryngeal vestibule closure, and reduced pharyngeal stripping. Findings: -There was x1 instance of trace-min silent aspiration of thin liquids (via straw) before the swallow. Aspiration occurred posteriorly from the pyriform sinuses due to pooling of liquids before swallow was initiated. Aspiration event was completely silent without cough, water eyes, increased RR, or any other subtle s/s of aspiration. -There was intermittent, trace, shallow, transient penetration of nectar-thick and thin liquids by cup. -There was shallow penetration without ejection of mixture of thin and nectar-thick residue towards end of study. Penetrated residue appeared to move slowly towards the airway, though remained above the vocal cords during assessment.  Pt unable to follow directions for strong cough to attempt to clear residue. Diet recommendations outlined below. SLP  will follow up for ongoing diet tolerance assessment and therapeutic PO trials of pureed solids to address oral deficits. Recommend continue tube feeds via Cortrak for nutritional support and medication delivery. Factors that may increase risk of adverse event in presence of aspiration Rubye Oaks & Clearance Coots 2021): Factors that may increase risk of adverse event in presence of aspiration Rubye Oaks & Clearance Coots 2021): Reduced cognitive function; Presence of tubes (ETT, trach, NG, etc.); Weak cough; Dependence for feeding and/or oral hygiene; Inadequate oral hygiene Recommendations/Plan: Swallowing Evaluation Recommendations Swallowing Evaluation Recommendations Recommendations: PO diet PO Diet Recommendation: Clear liquid diet; Thin liquids (Level 0) (ok for any thin liquids) Liquid Administration via: Cup; No straw Medication Administration: Via alternative means Supervision: Staff to assist with self-feeding; Full supervision/cueing for swallowing strategies Swallowing strategies  : Minimize environmental distractions; Small bites/sips; Check for anterior loss Postural changes: Position pt fully upright for meals; Stay upright 30-60 min after meals Oral care recommendations: Oral care QID (4x/day); Staff/trained caregiver to provide oral care; Use suctioning for oral care Caregiver Recommendations: Have oral suction available Treatment Plan Treatment Plan Treatment recommendations: Therapy as outlined in treatment plan below Follow-up recommendations: Follow physicians's recommendations for discharge plan and follow up therapies (SLP services at next level of care) Functional status assessment: Patient has had a recent decline in their functional status and demonstrates the ability to make significant improvements in function in a reasonable and predictable amount of time. Treatment frequency: Min 2x/week Treatment duration: 2 weeks Interventions: Aspiration precaution training; Compensatory techniques; Patient/family  education; Trials of upgraded texture/liquids; Diet toleration management by SLP; Oropharyngeal exercises Recommendations Recommendations for follow up therapy are one component of a multi-disciplinary discharge planning process, led by the attending physician.  Recommendations may be updated based on patient status, additional functional criteria and insurance authorization. Assessment: Orofacial Exam: Orofacial Exam Oral Cavity: Oral Hygiene: WFL Oral Cavity - Dentition: Adequate natural dentition Orofacial Anatomy: WFL Oral Motor/Sensory Function: Suspected cranial nerve impairment (limited assessment, though concern for oral apraxia) Anatomy: Anatomy: WFL Boluses Administered: Boluses Administered Boluses Administered: Mildly thick liquids (Level 2, nectar thick); Thin liquids (Level 0); Puree; Solid  Oral Impairment Domain: Oral Impairment Domain Lip Closure: Escape beyond mid-chin Tongue control during bolus hold: Posterior escape of greater than half of bolus Bolus preparation/mastication: Minimal chewing/mashing with majority of bolus unchewed (removed from oral cavity) Bolus transport/lingual motion: Minimal-no tongue motion Oral residue: Majority of bolus remaining Location of oral residue : Floor of mouth; Tongue Initiation of pharyngeal swallow : Pyriform sinuses  Pharyngeal Impairment Domain: Pharyngeal Impairment Domain Soft palate elevation: No bolus between soft palate (SP)/pharyngeal wall (PW) Laryngeal elevation: Partial superior movement of thyroid cartilage/partial approximation of arytenoids to epiglottic petiole Anterior hyoid excursion: Complete anterior movement Epiglottic movement: Complete inversion Laryngeal vestibule closure: Incomplete, narrow column air/contrast in laryngeal vestibule Pharyngeal stripping wave : Present - diminished Pharyngeal contraction (A/P view only): N/A Pharyngoesophageal segment opening: Complete distension and complete duration, no obstruction of flow Tongue  base retraction: Trace column of contrast or air between tongue base and PPW Pharyngeal residue: Collection of residue within or on pharyngeal structures Location of pharyngeal residue: Tongue base; Valleculae  Esophageal Impairment Domain: No data recorded Pill: No data recorded Penetration/Aspiration Scale Score: Penetration/Aspiration Scale Score 1.  Material does not enter airway: Thin liquids (Level 0); Mildly thick liquids (Level 2, nectar thick); Puree 2.  Material enters airway, remains  ABOVE vocal cords then ejected out: Thin liquids (Level 0); Mildly thick liquids (Level 2, nectar thick) 3.  Material enters airway, remains ABOVE vocal cords and not ejected out: Thin liquids (Level 0); Mildly thick liquids (Level 2, nectar thick) 8.  Material enters airway, passes BELOW cords without attempt by patient to eject out (silent aspiration) : Thin liquids (Level 0) Compensatory Strategies: Compensatory Strategies Compensatory strategies: Yes Straw: Ineffective Ineffective Straw: Thin liquid (Level 0) Other(comment): Ineffective (tactile pressure with spoon in effort to initiate oral transit) Ineffective Other(comment): Mildly thick liquid (Level 2, nectar thick); Moderately thick liquid (Level 3, honey thick)   General Information: Caregiver present: No (follow up education completed on phone with daughter)  Diet Prior to this Study: NPO; Cortrak/Small bore NG tube   Temperature : Febrile   Respiratory Status: WFL   Supplemental O2: Nasal cannula (2L)   History of Recent Intubation: Yes  Behavior/Cognition: Alert; Cooperative; Pleasant mood Self-Feeding Abilities: Needs set-up for self-feeding Baseline vocal quality/speech: -- (intermittent vocal wetness) Volitional Cough: Unable to elicit Volitional Swallow: Unable to elicit Exam Limitations: No limitations Goal Planning: Prognosis for improved oropharyngeal function: Fair Barriers to Reach Goals: Cognitive deficits No data recorded Patient/Family Stated Goal:  drink water Consulted and agree with results and recommendations: Patient; Family member/caregiver; Nurse Pain: Pain Assessment Pain Assessment: No/denies pain Breathing: 0 Negative Vocalization: 0 Facial Expression: 0 Body Language: 1 Consolability: 0 PAINAD Score: 1 Facial Expression: 0 Body Movements: 0 Muscle Tension: 0 Compliance with ventilator (intubated pts.): N/A Vocalization (extubated pts.): 0 CPOT Total: 0 End of Session: Start Time:SLP Start Time (ACUTE ONLY): 0900 Stop Time: SLP Stop Time (ACUTE ONLY): 0930 Time Calculation:SLP Time Calculation (min) (ACUTE ONLY): 30 min Charges: SLP Evaluations $ SLP Speech Visit: 1 Visit SLP Evaluations $BSS Swallow: 1 Procedure $MBS Swallow: 1 Procedure SLP visit diagnosis: SLP Visit Diagnosis: Dysphagia, oropharyngeal phase (R13.12) Past Medical History: Past Medical History: Diagnosis Date  Cancer (HCC)   prostate  Follicular lymphoma (HCC) 2024  History of pulmonary embolism   Hypertension   Myelodysplastic syndrome (HCC)   PAF (paroxysmal atrial fibrillation) (HCC)   Recurrent UTI  Past Surgical History: Past Surgical History: Procedure Laterality Date  APPLICATION OF CRANIAL NAVIGATION Right 03/10/2023  Procedure: APPLICATION OF CRANIAL NAVIGATION;  Surgeon: Tressie Stalker, MD;  Location: [facility] OR;  Service: Neurosurgery;  Laterality: Right;  BIOPSY  08/21/2022  Procedure: BIOPSY;  Surgeon: Beverley Fiedler, MD;  Location: [facility] ENDOSCOPY;  Service: Gastroenterology;;  CRANIOTOMY Right 03/10/2023  Procedure: PARIETAL CRANIOTOMY;  Surgeon: Tressie Stalker, MD;  Location: [facility] OR;  Service: Neurosurgery;  Laterality: Right;  ESOPHAGOGASTRODUODENOSCOPY (EGD) WITH PROPOFOL N/A 08/21/2022  Procedure: ESOPHAGOGASTRODUODENOSCOPY (EGD) WITH PROPOFOL;  Surgeon: Beverley Fiedler, MD;  Location: [facility] ENDOSCOPY;  Service: Gastroenterology;  Laterality: N/A;  IR IMAGING GUIDED PORT INSERTION  08/26/2022  LYMPHADENECTOMY Bilateral 09/01/2019  Procedure: LYMPHADENECTOMY;  Surgeon: Sebastian Ache, MD;  Location: [facility] ORS;  Service: Urology;  Laterality: Bilateral;  ROBOT ASSISTED LAPAROSCOPIC RADICAL PROSTATECTOMY N/A 09/01/2019  Procedure: XI ROBOTIC ASSISTED LAPAROSCOPIC RADICAL PROSTATECTOMY;  Surgeon: Sebastian Ache, MD;  Location: [facility] ORS;  Service: Urology;  Laterality: N/A;  3 HRS [name] 04/28/2023, 11:06 AM  Overnight EEG with video Result Date: 04/28/2023 Charlsie Quest, MD     04/28/2023  9:40 AM Patient Name: Tyler [name] MRN: [id number] Epilepsy Attending: Charlsie Quest Referring Physician/Provider: Gordy Councilman, MD Duration: 04/27/2023 1608 to 04/28/2023 0830 Patient history: 73yo M patient with rt parietal CNS  lymphoma with left facial twitching. EEG to evaluate for seizure Level of alertness: Awake/lethargic AEDs during EEG study: LEV Technical aspects: This EEG study was done with scalp electrodes positioned according to the 10-20 International system of electrode placement. Electrical activity was reviewed with band pass filter of 1-70Hz , sensitivity of 7 uV/mm, display speed of 81mm/sec with a 60Hz  notched filter applied as appropriate. EEG data were recorded continuously and digitally stored.  Video monitoring was available and reviewed as appropriate. Description: No clear posterior dominant rhythm was seen.  EEG showed continuous generalized and maximal right parietal 3 to 6 Hz theta-delta slowing. Lateralized periodic discharges were noted in right hemisphere, maximal right parietal region at 1 Hz, at times with overlying rhythmicity. Seizures without clinical signs were noted arising from right parietal region, average 5 seizures per hour, lasting 1 to 2 minutes each.  During seizure EEG showed 5 to 6 Hz theta slowing admixed with sharp waves and right parietal region which then involved all of right hemisphere and evolved into 2 to 3 Hz delta slowing.  Hyperventilation and photic stimulation were not performed.   ABNORMALITY -Seizure without clinical signs, right  parietal region -Lateralized periodic discharges with overlying rhythmicity, right hemisphere, maximal right frontal region ( LPD+R) -Continuous slow, generalized and maximal right parietal region IMPRESSION: This study showed seizures without clinical signs arising from right parietal region, average 5 seizures per hour lasting 1 to 2 minutes each. Additionally there is evidence of epileptogenicity and cortical dysfunction arising from right hemisphere, maximal right parietal region likely secondary to underlying structural abnormality.  Charlsie Quest   DG CHEST PORT 1 VIEW Result Date: 04/27/2023 CLINICAL DATA:  Acute hypoxemic respiratory failure. EXAM: PORTABLE CHEST 1 VIEW COMPARISON:  Chest radiograph dated 04/26/2023. FINDINGS: The heart is enlarged. An endotracheal tube terminates in the midthoracic trachea. An enteric tube enters the stomach and terminates below the field of view. A right internal jugular central venous port catheter tip overlies the right atrium. There is mild bibasilar atelectasis/airspace disease, similar to prior exam. IMPRESSION: Mild bibasilar atelectasis/airspace disease, similar to prior exam. Electronically Signed   By: Romona Curls M.D.   On: 04/27/2023 14:57   ECHOCARDIOGRAM COMPLETE Result Date: 04/26/2023    ECHOCARDIOGRAM REPORT   Patient Name:   CYPRUS KUANG Date of Exam: 04/26/2023 Medical Rec #:  [id number]    Height:       67.0 in Accession #:    [id number]   Weight:       184.1 lb Date of Birth:  12-22-1950    BSA:          1.952 m Patient Age:    72 years     BP:           151/88 mmHg Patient Gender: M            HR:           48 bpm. Exam Location:  Inpatient Procedure: 2D Echo, Color Doppler and Cardiac Doppler (Both Spectral and Color            Flow Doppler were utilized during procedure). Indications:    Resp Distress  History:        Patient has prior history of Echocardiogram examinations, most                 recent 10/03/2022. Chemo; Risk  Factors:Hypertension.  Sonographer:    Amy Chionchio Referring Phys: [id number] MURALI RAMASWAMY IMPRESSIONS  1. Left ventricular ejection fraction,  by estimation, is 60 to 65%. The left ventricle has normal function. The left ventricle has no regional wall motion abnormalities. There is mild left ventricular hypertrophy. Left ventricular diastolic parameters are indeterminate.  2. Right ventricular systolic function is normal. The right ventricular size is normal. Tricuspid regurgitation signal is inadequate for assessing PA pressure.  3. The mitral valve is normal in structure. Trivial mitral valve regurgitation. No evidence of mitral stenosis.  4. The aortic valve is tricuspid. Aortic valve regurgitation is not visualized. No aortic stenosis is present.  5. The inferior vena cava is dilated in size with <50% respiratory variability, suggesting right atrial pressure of 15 mmHg. FINDINGS  Left Ventricle: Left ventricular ejection fraction, by estimation, is 60 to 65%. The left ventricle has normal function. The left ventricle has no regional wall motion abnormalities. Strain imaging was not performed. The left ventricular internal cavity  size was normal in size. There is mild left ventricular hypertrophy. Left ventricular diastolic parameters are indeterminate. Right Ventricle: The right ventricular size is normal. No increase in right ventricular wall thickness. Right ventricular systolic function is normal. Tricuspid regurgitation signal is inadequate for assessing PA pressure. Left Atrium: Left atrial size was normal in size. Right Atrium: Right atrial size was normal in size. Pericardium: There is no evidence of pericardial effusion. Mitral Valve: The mitral valve is normal in structure. Trivial mitral valve regurgitation. No evidence of mitral valve stenosis. MV peak gradient, 2.4 mmHg. The mean mitral valve gradient is 1.0 mmHg. Tricuspid Valve: The tricuspid valve is normal in structure. Tricuspid valve  regurgitation is trivial. Aortic Valve: The aortic valve is tricuspid. Aortic valve regurgitation is not visualized. No aortic stenosis is present. Aortic valve mean gradient measures 3.0 mmHg. Aortic valve peak gradient measures 5.7 mmHg. Aortic valve area, by VTI measures 2.11 cm. Pulmonic Valve: The pulmonic valve was grossly normal. Pulmonic valve regurgitation is not visualized. Aorta: The aortic root and ascending aorta are structurally normal, with no evidence of dilitation. Venous: The inferior vena cava is dilated in size with less than 50% respiratory variability, suggesting right atrial pressure of 15 mmHg. IAS/Shunts: The interatrial septum was not well visualized. Additional Comments: 3D imaging was not performed.  LEFT VENTRICLE PLAX 2D LVIDd:         4.20 cm     Diastology LVIDs:         2.70 cm     LV e' medial:    5.11 cm/s LV PW:         1.10 cm     LV E/e' medial:  14.0 LV IVS:        0.90 cm     LV e' lateral:   7.94 cm/s LVOT diam:     2.20 cm     LV E/e' lateral: 9.0 LV SV:         58 LV SV Index:   30 LVOT Area:     3.80 cm  LV Volumes (MOD) LV vol d, MOD A2C: 69.9 ml LV vol d, MOD A4C: 90.8 ml LV vol s, MOD A2C: 23.8 ml LV vol s, MOD A4C: 29.9 ml LV SV MOD A2C:     46.1 ml LV SV MOD A4C:     90.8 ml LV SV MOD BP:      53.6 ml RIGHT VENTRICLE             IVC RV Basal diam:  3.10 cm     IVC diam: 2.20 cm RV  S prime:     14.90 cm/s TAPSE (M-mode): 1.7 cm LEFT ATRIUM             Index        RIGHT ATRIUM          Index LA Vol (A2C):   31.3 ml 16.03 ml/m  RA Area:     9.52 cm LA Vol (A4C):   54.0 ml 27.66 ml/m  RA Volume:   14.30 ml 7.32 ml/m LA Biplane Vol: 43.6 ml 22.33 ml/m  AORTIC VALVE                    PULMONIC VALVE AV Area (Vmax):    2.48 cm     PV Vmax:       0.77 m/s AV Area (Vmean):   2.33 cm     PV Peak grad:  2.4 mmHg AV Area (VTI):     2.11 cm AV Vmax:           119.00 cm/s AV Vmean:          78.400 cm/s AV VTI:            0.275 m AV Peak Grad:      5.7 mmHg AV Mean Grad:       3.0 mmHg LVOT Vmax:         77.50 cm/s LVOT Vmean:        48.100 cm/s LVOT VTI:          0.153 m LVOT/AV VTI ratio: 0.56  AORTA Ao Root diam: 3.30 cm Ao Asc diam:  3.40 cm MITRAL VALVE MV Area (PHT): 2.70 cm    SHUNTS MV Area VTI:   2.10 cm    Systemic VTI:  0.15 m MV Peak grad:  2.4 mmHg    Systemic Diam: 2.20 cm MV Mean grad:  1.0 mmHg MV Vmax:       0.77 m/s MV Vmean:      35.3 cm/s MV Decel Time: 281 msec MV E velocity: 71.70 cm/s MV A velocity: 83.10 cm/s MV E/A ratio:  0.86 Epifanio Lesches MD Electronically signed by Epifanio Lesches MD Signature Date/Time: 04/26/2023/2:18:08 PM    Final    DG CHEST PORT 1 VIEW Result Date: 04/26/2023 CLINICAL DATA:  Endotracheal tube present. EXAM: PORTABLE CHEST 1 VIEW COMPARISON:  Radiograph yesterday.  CT earlier today FINDINGS: Endotracheal tube tip 4.6 cm from the carina. Weighted enteric tube tip below the diaphragm not included in the field of view. Right chest port unchanged in position. Stable heart size and mediastinal contours. Left lung base opacity has progressed from yesterday's radiograph, subtotal lower lobe collapse on CT. Additional streaky atelectasis in the left mid lung. Right lower lobe opacity on CT has no definite radiographic correlate. No pneumothorax or large pleural effusion. IMPRESSION: 1. Endotracheal tube tip 4.6 cm from the carina. 2. Left lung base opacity has progressed from yesterday's radiograph, subtotal lower lobe collapse on this morning's CT. Additional streaky atelectasis in the left mid lung. Electronically Signed   By: Narda Rutherford M.D.   On: 04/26/2023 13:27   CT CHEST W CONTRAST Result Date: 04/26/2023 CLINICAL DATA:  Head neck cancer.  Airway compromise with intubation EXAM: CT CHEST WITH CONTRAST TECHNIQUE: Multidetector CT imaging of the chest was performed during intravenous contrast administration. RADIATION DOSE REDUCTION: This exam was performed according to the departmental dose-optimization program  which includes automated exposure control, adjustment of the mA and/or kV according to patient size  and/or use of iterative reconstruction technique. CONTRAST:  OMNIPAQUE IOHEXOL 300 MG/ML  SOLN COMPARISON:  Head CT 04/16/2023 FINDINGS: Cardiovascular: Normal heart size. No pericardial effusion. Atheromatous calcification of the coronary arteries. Porta catheter with tip at the upper right atrium. Mediastinum/Nodes: Located endotracheal and enteric tube. No mass or adenopathy. Lungs/Pleura: Multi segment opacification in the lower lobes with volume loss, worse on the left. No consolidation, edema, effusion, or pneumothorax. Upper Abdomen: Enteric tube with tip at the pylorus. No acute finding Musculoskeletal: Thoracic spondylosis.  No acute finding IMPRESSION: Multi segment atelectasis in the lower lobes Electronically Signed   By: Tiburcio Pea M.D.   On: 04/26/2023 06:31   CT HEAD WO CONTRAST ( ) Result Date: 04/26/2023 CLINICAL DATA:  Mental status change.  History of CNS lymphoma. EXAM: CT HEAD WITHOUT CONTRAST TECHNIQUE: Contiguous axial images were obtained from the base of the skull through the vertex without intravenous contrast. RADIATION DOSE REDUCTION: This exam was performed according to the departmental dose-optimization program which includes automated exposure control, adjustment of the mA and/or kV according to patient size and/or use of iterative reconstruction technique. COMPARISON:  Brain MRI 04/24/2023 FINDINGS: Brain: Cortical edema at the right insula correlating with abnormality on prior brain MRI. Small area of blood products in the parasagittal right parietal lobe with adjacent swelling, operative site. No evidence of interval infarct, hydrocephalus, or collection. Vascular: No hyperdense vessel or unexpected calcification. Skull: Unremarkable right parietal craniotomy site Sinuses/Orbits: Negative IMPRESSION: No acute finding when compared to 04/24/2023 brain MRI. Electronically  Signed   By: Tiburcio Pea M.D.   On: 04/26/2023 05:09   CT SOFT TISSUE NECK W CONTRAST Result Date: 04/26/2023 CLINICAL DATA:  Soft tissue infection suspected. Neck swelling and airway compromise EXAM: CT NECK WITH CONTRAST TECHNIQUE: Multidetector CT imaging of the neck was performed using the standard protocol following the bolus administration of intravenous contrast. RADIATION DOSE REDUCTION: This exam was performed according to the departmental dose-optimization program which includes automated exposure control, adjustment of the mA and/or kV according to patient size and/or use of iterative reconstruction technique. CONTRAST:  OMNIPAQUE IOHEXOL 300 MG/ML  SOLN COMPARISON:  04/16/2023 head CT FINDINGS: Pharynx and larynx: The airway is collapsed around endotracheal tube and enteric tube with fluid layering in the nasopharynx. This limits assessment of the mucosal surfaces, no evidence of submucosal edema. Salivary glands: No inflammation, mass, or stone. Thyroid: Normal. Lymph nodes: None enlarged or heterogeneous. Vascular: No acute finding Limited intracranial: Negative Visualized orbits: Negative Mastoids and visualized paranasal sinuses: Clear Skeleton: No acute or aggressive finding. Upper chest: Clear apical lungs. IMPRESSION: No acute finding. Assessment of mucosal surfaces limited by airway collapse in the setting of intubation. Electronically Signed   By: Tiburcio Pea M.D.   On: 04/26/2023 05:03   DG Abd 1 View Result Date: 04/26/2023 CLINICAL DATA:  Check gastric catheter placement EXAM: ABDOMEN - 1 VIEW COMPARISON:  None Available. FINDINGS: Weighted feeding catheter is noted in the distal stomach directed towards the pole or is. No free air is seen. No obstructive changes are noted. IMPRESSION: Feeding catheter as described. Electronically Signed   By: Alcide Clever M.D.   On: 04/26/2023 02:11   DG CHEST PORT 1 VIEW Result Date: 04/25/2023 CLINICAL DATA:  Check endotracheal tube  placement EXAM: PORTABLE CHEST 1 VIEW COMPARISON:  Film from earlier in the same day. FINDINGS: Endotracheal tube is noted 5.3 cm above the carina. Gastric catheter extends into the stomach. Right chest wall port  is again seen. Cardiac shadow is stable. No focal infiltrate is noted. Mild basilar atelectasis is seen. IMPRESSION: Tubes and lines as described. Electronically Signed   By: Alcide Clever M.D.   On: 04/25/2023 21:44   DG Chest Port 1 View Result Date: 04/25/2023 CLINICAL DATA:  Altered mental status EXAM: PORTABLE CHEST 1 VIEW COMPARISON:  04/10/2023 FINDINGS: Cardiac shadow is enlarged but stable. Right chest wall port is again seen. Mild basilar atelectasis is seen. No bony abnormality is noted. IMPRESSION: Mild basilar atelectasis. Electronically Signed   By: Alcide Clever M.D.   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 History of B-cell lymphoma EXAM: MRI HEAD WITHOUT AND WITH CONTRAST TECHNIQUE: Multiplanar, multiecho pulse sequences of the brain and surrounding structures were obtained without and with intravenous contrast. CONTRAST:  8mL GADAVIST GADOBUTROL 1 MMOL/ML IV SOLN COMPARISON:  Comparison made with previous MRI from 04/07/2023 as well as earlier studies. FINDINGS: Brain: Postoperative changes from prior right parietal craniotomy again seen. 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 Size of the lesion is markedly decreased from prior, now measuring 2.9 x 3.5 x 2.3 cm (series 18, image 10). Superimposed susceptibility artifact consistent with blood products. Persistent but improved vasogenic edema within the adjacent posterior right cerebral hemisphere, with improved/resolved mass effect on the right lateral ventricle. Previously seen right to left shift has resolved. Findings consistent with interval response to therapy. Now seen is  restricted diffusion involving the right insular cortex and overlying posterior right frontal region (series 5, images 26, 30). Associated gyral swelling with T2/FLAIR signal abnormality within this location (series 9, image 28). No associated enhancement. 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 No other mass lesion or abnormal enhancement. No other acute or subacute infarct. No hydrocephalus or extra-axial fluid collection. Underlying cerebral white matter disease noted, stable. Vascular: Major intracranial vascular flow voids are maintained. Skull and upper cervical spine: Craniocervical junction within normal limits. Bone marrow signal intensity within normal limits. Post craniotomy changes at the right parietal calvarium without adverse features. Sinuses/Orbits: Globes and orbital soft tissues within normal limits. Mild mucosal thickening present about the ethmoidal air cells and maxillary sinuses. No mastoid effusion. Other: None. IMPRESSION: 1. Marked interval decrease in size of right parietal mass/lymphoma, now measuring 2.9 x 3.5 x 2.3 cm (previously 5.4 x 3.6 x 4.4 cm), consistent with interval response to therapy. Persistent but improved vasogenic edema within the adjacent posterior right cerebral hemisphere, with interval resolution of previously seen right-to-left midline shift. 2. 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 Correlation with symptomatology and EEG recommended. Evolving changes of subacute ischemia would be the primary differential consideration, but are felt to be less likely. Electronically Signed   By: Rise Mu M.D.   On: 04/25/2023 05:24   DG CHEST PORT 1 VIEW Result Date: 04/10/2023 CLINICAL DATA:  Shortness of breath.  History of lymphoma EXAM: PORTABLE CHEST 1 VIEW COMPARISON:  X-ray 08/11/2022.  PET-CT 12/27/2022 FINDINGS: Right IJ chest port with tip  overlying the right atrium. The port is accessed. Widening of the mediastinum is again seen. Enlarged cardiac silhouette. Basilar atelectasis or scar. No pneumothorax. Film is underinflated. No edema or effusion. Degenerative changes of the spine IMPRESSION: Underinflation.  Basilar atelectasis or scar. Widened mediastinum. Similar previous. Please correlate with prior  PET-CT 12/27/2022 demonstrating prominent mediastinal fat. Chest port Electronically Signed   By: Karen Kays M.D.   On: 04/10/2023 19:17   EEG adult Result Date: 04/10/2023 Charlsie Quest, MD     04/11/2023  8:38 AM Patient Name: Zacchary Pompei MRN: [id number] Epilepsy Attending: Charlsie Quest Referring Physician/Provider: Johney Maine, MD Date: 04/10/2023 Duration: 22.17 mins Patient history: 73yo M patient with rt parietal CNS lymphoma with intermittent left sided painful burning paresthesias and weakness. EEG to evaluate for seizure Level of alertness: Awake, asleep AEDs during EEG study: None Technical aspects: This EEG study was done with scalp electrodes positioned according to the 10-20 International system of electrode placement. Electrical activity was reviewed with band pass filter of 1-70Hz , sensitivity of 7 uV/mm, display speed of 31mm/sec with a 60Hz  notched filter applied as appropriate. EEG data were recorded continuously and digitally stored.  Video monitoring was available and reviewed as appropriate. Description: The posterior dominant rhythm consists of 8-9 Hz activity of moderate voltage (25-35 uV) seen predominantly in posterior head regions, symmetric and reactive to eye opening and eye closing. Sleep was characterized by vertex waves, sleep spindles (12 to 14 Hz), maximal frontocentral region. EEG showed continuous sharply contoured 3 to 6 Hz theta-delta slowing in right parietal region consistent with breach artifact. Sharp waves were noted in right parietal region. Hyperventilation and photic stimulation were not  performed.   ABNORMALITY - Sharp wave, right parietal region - Continuous slow,  right parietal region IMPRESSION: This study showed evidence of epileptogenicity arising from right parietal region. Additionally there is cortical dysfunction arising from right parietal region consistent with underlying craniotomy. No seizures were seen throughout the recording. Charlsie Quest   MR Brain W and Wo Contrast Result Date: 04/07/2023 CLINICAL DATA:  Provided history: Neuro deficit, persistent/recurrent, CNS neoplasm suspected. Diffuse large B-cell lymphoma. Left-sided weakness with midline shift on CT. EXAM: MRI HEAD WITHOUT AND WITH CONTRAST TECHNIQUE: Multiplanar, multiecho pulse sequences of the brain and surrounding structures were obtained without and with intravenous contrast. CONTRAST:  8mL GADAVIST GADOBUTROL 1 MMOL/ML IV SOLN COMPARISON:  Head CT 04/07/2023.  Brain MRI 03/07/2023 neck FINDINGS: Intermittently motion degraded examination. Most notably, the axial T1 post-contrast sequence and coronal T2 sequence are moderately motion degraded. Within this limitation findings are as follows. Brain: Since the prior MRI 06/05/2023, there has been right parietal craniotomy for biopsy/debulking of an enhancing right parietal lobe tumor. Non-acute blood products at the operative site. The residual enhancing tumor measures 5.4 x 3.6 x 4.4 cm (previously 4.4 x 3.0 x 3.3 when remeasured on prior). Prominent surrounding edema slightly increased. 6 mm leftward midline shift has also increased (previously 4 mm). Persistent effacement of portions of the right lateral ventricle. Persistent asymmetric prominence of the right lateral ventricle temporal horn consistent with ventricular entrapment. Trace postoperative subdural collection along the anterior right cerebral hemisphere. Mild-to-moderate multifocal T2 FLAIR hyperintense signal abnormality elsewhere within the cerebral white matter, nonspecific but compatible with  chronic small vessel ischemic disease. There is no acute infarct. Vascular: Maintained flow voids within the proximal large arterial vessels. Skull and upper cervical spine: Right parietal cranioplasty. No focal worrisome marrow lesion. Sinuses/orbits: No orbital mass or acute orbital finding.Mild mucosal thickening within the left maxillary sinus. IMPRESSION: 1. Motion degraded examination. 2. Since the prior brain MRI 03/07/2023, there has been right parietal craniotomy for biopsy/debulking of an enhancing right parietal lobe tumor. The residual tumor has increased in size, now measuring 5.4 x 3.6 x  4.4 cm (previously 4.4 x 3.0 x 3.3 cm). Prominent surrounding edema has slightly increased. 6 mm leftward midline shift has also increased. Unchanged asymmetric prominence of the right lateral ventricle temporal horn consistent with ventricular entrapment. Electronically Signed   By: Jackey Loge D.O.   On: 04/07/2023 14:01   CT Head Wo Contrast Result Date: 04/07/2023 CLINICAL DATA:  Facial paralysis/weakness (CN 7) EXAM: CT HEAD WITHOUT CONTRAST TECHNIQUE: Contiguous axial images were obtained from the base of the skull through the vertex without intravenous contrast. RADIATION DOSE REDUCTION: This exam was performed according to the departmental dose-optimization program which includes automated exposure control, adjustment of the mA and/or kV according to patient size and/or use of iterative reconstruction technique. COMPARISON:  Head CT 03/06/2023, brain MR 03/07/2023 FINDINGS: Brain: No hemorrhage. No hydrocephalus. No extra-axial fluid collection postsurgical changes from a right parietal craniotomy with interval increase in size of the residual/recurrent tumor at the craniotomy site, now measuring up to 4.7 x 2.7 cm, previously 3.2 x 2.5 cm, when measured in a similar orientation. There is increased leftward midline shift, now measuring up to 6 mm, previously 3 mm. Unchanged disproportionate enlargement of  the right temporal horn, worrisome for ventricular entrapment. Vascular: No hyperdense vessel or unexpected calcification. Skull: Normal. Negative for fracture or focal lesion. Status post parietal craniotomy Sinuses/Orbits: No middle ear or mastoid effusion. Mucosal thickening in the bilateral maxillary sinuses, right-greater-than-left orbits are unremarkable. Other: None. IMPRESSION: 1. Postsurgical changes from a right parietal craniotomy with interval increase in size of the residual/recurrent tumor at the craniotomy site, now measuring up to 4.7 x 2.7 cm, previously 3.2 x 2.5 cm. There is increased leftward midline shift, now measuring up to 6 mm, previously 3 mm. Recommend further evaluation with brain MRI. 2. Unchanged asymmetric enlargement of the right temporal horn, compatible with ventricular entrapment Electronically Signed   By: Lorenza Cambridge M.D.   On: 04/07/2023 10:57   ADDENDUM  .Patient was Personally and independently interviewed, examined and relevant elements of the history of present illness were reviewed in details and an assessment and plan was created. All elements of the patient's history of present illness , assessment and plan were discussed in details with Hinton Dyer NP. The above documentation reflects our combined findings assessment and plan.  -- Patient has completed cycle 2 of high-dose methotrexate with good treatment response on MRI of the brain. -Appreciate PCCM and neurology input to address his recurrent seizure issues.  Currently on Keppra and Vimpat. -Completed with leucovorin rescue and high-dose steroids as premedications and back to his baseline dexamethasone 4 mg twice daily with a plan to gradually taper this. -Outpatient Rituxan when able. -Depending on the status of his seizures and overall functional status we will decide if he is able to complete the additional 2 planned high-dose methotrexate treatments or whether we might need to consider involving  radiation oncology.  This is not an urgent decision at this point need to be made prior to when he is due for his next high-dose methotrexate infusion. -Oncology will continue to follow.  No other oncologic interventions planned this admission.  Wyvonnia Lora MD MS

## 2023-04-28 NOTE — Procedures (Signed)
 Modified Barium Swallow Study  Patient Details  Name: Tyler [name] MRN: [id number] Date of Birth: 13-Apr-1950  Today's Date: 04/28/2023  Modified Barium Swallow completed.  Full report located under Chart Review in the Imaging Section.  History of Present Illness 73 yo male presents to [facility] on 2/10 with LUE and LE weakness, and change in mentation. Head CT of brain shows a recurrent tumor at the craniotomy site and increased size of recurrent tumor along with a left midline shift up to 6 mm. 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 Pt's swallowing was evaluated when he was admitted in June of 2024 - he was D/Cd on a dysphagia 3 diet with thin liquids with no f/u recommended. Pt's swallowing assessed again on 04/08/23 this admission with recommendation of a regular diet and thin liquids. Hospital course complicated by concern for neck swelling, acute dysphagia, and concern for airway protection prompting intubation from 02/28-03/02. Neck CT on 04/26/23: demonstrated "No acute finding. Assessment of mucosal surfaces limited by airway  collapse in the setting of intubation.".   Clinical Impression  Pt presents with a moderate-severe oral dysphagia and a mild pharyngeal dysphagia per results of MBSS completed today.   Oral deficits included poor bolus awareness, oral holding, reduced labial seal resulting in anterior spillage, poor lingual movement or initiation for oral transit, and reduced oral coordination resulting in posterior loss of trials. Oral deficits were more pronounced with nectar-thick liquid, puree, and soft solid trials. Soft solid trial was removed from oral cavity due to minimal-no mastication efforts.  Pt required multiple verbal and tactile cues to eventually  initiate oral transit of nectar-thick and puree trials. Tactile cues included downward pressure of spoon on tongue multiple times.   Pharyngeal dysphagia characterized by reduced hyolaryngeal elevation, brief mistiming of pharyngeal swallow initiation, reduced base of tongue retraction, reduced laryngeal vestibule closure, and reduced pharyngeal stripping.   Findings:  -There was x1 instance of trace-min silent aspiration of thin liquids (via straw) before the swallow. Aspiration occurred posteriorly from the pyriform sinuses due to pooling of liquids before swallow was initiated. Aspiration event was completely silent without cough, water eyes, increased RR, or any other subtle s/s of aspiration.  -There was intermittent, trace, shallow, transient penetration of nectar-thick and thin liquids by cup.  -There was shallow penetration without ejection of mixture of thin and nectar-thick residue towards end of study. Penetrated residue appeared to move slowly towards the airway, though remained above the vocal cords during assessment. Pt unable to follow directions for strong cough to attempt to clear residue.   Diet recommendations outlined below. SLP will follow up for ongoing diet tolerance assessment and therapeutic PO trials of pureed solids to address oral deficits. Recommend continue tube feeds via [employer]rak for nutritional support and medication delivery.   Factors that may increase risk of adverse event in presence of aspiration [employer]ye [employer]aks [employer] Clearance Coots 2021): Reduced cognitive function;Presence of tubes (ETT, trach, NG, etc.);Weak cough;Dependence for feeding and/or oral hygiene;Inadequate oral hygiene  Swallow Evaluation Recommendations Recommendations: PO diet PO Diet Recommendation: Clear liquid diet;Thin liquids (Level 0) (ok for any thin liquids) Liquid Administration via: Cup;No straw Medication Administration: Via alternative means Supervision: Staff to assist with self-feeding;Full  supervision/cueing for swallowing strategies Swallowing strategies  : Minimize environmental distractions;Small bites/sips;Check for anterior loss Postural changes: Position pt  fully upright for meals;Stay upright 30-60 min after meals Oral care recommendations: Oral care QID (4x/day);Staff/trained caregiver to provide oral care;Use suctioning for oral care Caregiver Recommendations: Have oral suction available      [name] 04/28/2023,10:52 AM

## 2023-04-28 NOTE — Progress Notes (Signed)

## 2023-04-28 NOTE — Consult Note (Signed)
 NAME:  Tyler [name], MRN:  [id number], DOB:  11-25-1950, LOS: 6 ADMISSION DATE:  04/22/2023, CONSULTATION/ SERVICE  DATE:  04/25/23 REFERRING MD:  Dr. Candise Che, CHIEF COMPLAINT: Neck swelling, drooling, difficulty swallowing, altered mental status with concern for partial seizures  History of Present Illness:  Mr. Conry is a 73 year old gentleman with significant history of CNS relapse of large B-cell lymphoma, partial seizure who was admitted to cardiology service on 2/25 for second cycle of high-dose methotrexate with leucovorin rescue.  Patient also has prior history of paroxysmal atrial fibrillation not on anticoagulation due to bleeding risk, prostate cancer status post prostatectomy in 2021, pulmonary embolism, hepatitis B exposure.  Patient had high-grade large B-cell lymphoma and completed 6 cycles of R-CHOP with systemic remission in the past.  As per the primary oncologist, patient was doing well and receiving his medication as planned.  He was started on bicarb drip to minimize the methotrexate related toxicity.  However, this evening, oncologist noted increased neck swelling as well as patient had difficulty swallowing.  He was also noted to have drooling of saliva and developed altered mental status with facial twitching concerning for seizure activity.  There was concern about patient's ability to protect airway and possible angioedema (neck swelling), ED was consulted and patient was intubated emergently.  At the time of my evaluation, patient was intubated and unresponsive.  No family member at the bedside.  Rest of the information is as per the chart review and I was unable to independently verify with the patient.   PAST    has a past medical history of Cancer (HCC), Follicular lymphoma (HCC) (2024), History of pulmonary embolism, Hypertension, Myelodysplastic syndrome (HCC), PAF (paroxysmal atrial fibrillation) (HCC), and Recurrent UTI.   has a past surgical history that includes  Robot assisted laparoscopic radical prostatectomy (N/A, 09/01/2019); Lymphadenectomy (Bilateral, 09/01/2019); Esophagogastroduodenoscopy (egd) with propofol (N/A, 08/21/2022); biopsy (08/21/2022); IR IMAGING GUIDED PORT INSERTION (08/26/2022); Craniotomy (Right, 03/10/2023); and Application of cranial navigation (Right, 03/10/2023).   Significant events   04/22/2023 - ADMIT  2/27  - MRI brain - Marked interval decrease in size of right parietal mass/lymphoma, now measuring 2.9 x 3.5 x 2.3 cm (previously 5.4 x 3.6 x 4.4 cm), consistent with interval response to therapy. Persistent but improved vasogenic edema within the adjacent posterior right cerebral hemisphere, with interval resolution of previously seen right-to-left midline shift. 2. 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 Correlation with symptomatology and EEG recommended. Evolving changes of subacute ischemia would be the primary differential consideration, but are felt to be less likely.  2/28 - Transferred to ICU and intubated. Witnessed by Dr Candise Che with have left neck swelling, gurgling, and twitching left side -> then partially respnsvive -> ativan/confused -> EDP intubated. Around 7.30-8pm  04/26/23: Coretrack placed early hours .Intubated early hours.  On the ventilator FiO2 50%.  Low-grade fever since 04/17/2023 but normal white count. Ceribell EEG oongoing. Prmary language for aptient is Swahili. SBP HIGH with HR 40s - on scheduled lopressor . Ceribel shows    ceribell EEG is is suggestive of severe diffuse encephalopathy. No seizures were seen throughout the recording.   - Blood clture  - Tracheal  aspirate  3/2 Extubated. Remains on PANDA. Scheduled for barium today. Remains on cEEG per Neuro   SUBJECTIVE/OVERNIGHT/INTERVAL HX   3/2 Extubated. Remains on PANDA. Scheduled for barium today. Remains on cEEG per Neuro. Yesterday had  fevers and cultures  NGTD  Objective   Blood pressure (!) 166/92, pulse 71, temperature (!) 100.8 F (38.2 C), resp. rate (!) 23, height 5\' 7"  (1.702 m), weight 89.2 kg, SpO2 95%.        Intake/Output Summary (Last 24 hours) at 04/28/2023 0844 Last data filed at 04/28/2023 0600 Gross per 24 hour  Intake 943.86 ml  Output 1560 ml  Net -616.14 ml   Filed Weights   04/26/23 0500 04/27/23 0500 04/28/23 0500  Weight: 83.5 kg 85.5 kg 89.2 kg   Physical Exam: General: Well-appearing, no acute distress HENT: [place], AT, OP clear, MMM, drooling Eyes: EOMI, no scleral icterus Respiratory: Clear to auscultation bilaterally.  No crackles, wheezing or rales Cardiovascular: RRR, -M/R/G, no JVD GI: BS+, soft, nontender Extremities:-Edema,-tenderness Neuro: Awake and alert, CNII-XII grossly intact, moves extremities x 4  CMET pending.  On 3/2 stable WBC 2.5 CXR mild bibasilar atelectasis, unchanged compared to 3/1  Assessment & Plan:   Acute respiratory failure 04/25/2023 evening associated with neck swelling  (likely fat tissue chages with seizures) and seizures and  postictal state  - s/p Intubation .   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 S/p chemotherapy R-CHOP.  S/p craniotomy with gross total resection 03/10/23.  - CT of head 04/07/2023 showed interval increase in size of tumor at the craniotomy site. MRI of head 04/07/2023 increase in size of tumor with slightly increased surrounding edema. MRI brain 2/27  - improvmeent - Currently on Hi-dose methotrexate chemotherapy regimen which is more aggressive approach.  Received cycle 1 on 04/09/23 and admitted for cycle 2 of HIGH DOSE Methotrexate chemotherapy regimen,  completed  04/23/2023. P: Oncology following  - Now methotrexate level < 0.1. Off sodium bicarb, off leucovorin. No longer monitoring urine PH --PO dexamethasone for brain edema - IV Rituxan as outpatient between 2nd and 3rd cycle of methotrexate and subsequently between treatment cycles. Plan for first week of March 2025 if discharged - can be delayed if needed  Partial sensory seizures, left side Acute encephalopathy  -on 04/25/2203 felt due to CNS lymphoma with edema -04/25/2023: Morning reports of relatively normal mental status but at night sudden worsening with obtundation and potential seizures and intubated. MRI Mass improvd but 2/27 - New diffusion signal abnormality involving the right insular cortex and overlying right frontal lobe, favored to reflect sequelae of seizure -3/2 - doing wake up exam without seizures P:  Neurology following. Managing AEDs. Increased home keppra Continuous EEG Neuro checks  Hypertensive and Bradycardic on lopressor 04/26/23 Normal echo 04/26/23 P:  DC lopressor due to bradycardia 3/2 Start scheduled hydralazine Tele  Hep B antibody positive, chronic P: On tenofovir for hep B reactivation suppression in context of Rituxan use. Plan is to continue for 6 months after his last chemotherapy. Follow ID outpatient  Fever Tmax 102 on 3/3. Procal neg P: Monitor fever curve/WBC off antibiotics Cultures NGTD RVP    Mild anemia of critical illness onset less than 13 g% 04/26/2023 P:  Trend Transfuse if needed for Hggoal >7  Mild thrombocytopenia - mild P: Lovenox 40 mg every 24 hours    At risk for hypo and hyperglycemia P:   SSI Transition from D5/LR to tube feeds   Best practice:  Diet:  TF trickle 3/12/5 Pain/Anxiety/Delirium protocol (if indicated): n/a VAP protocol (if indicated): n/a DVT prophylaxis: lovnox GI prophylaxis: Home PPI Glucose control: ssi Mobility: bed rest Code Status: full Family Communication:   3/1 - Daughter Sherron Flemings -  > [phone] => updated her over phone. Also d/w Dr Candise Che  3/2 - Daughter Sherron Flemings -> upated over phone.  Explained need to go to [facility] 04/27/23 for c-EEG   ATTESTATION & SIGNATURE   The patient is critically ill with multiple organ systems failure and requires high complexity decision making for assessment and support, frequent evaluation and titration of therapies, application of advanced monitoring technologies and extensive interpretation of multiple databases.  Independent Critical Care Time: 33 Minutes.   Mechele Collin, M.D. [facility] Pulmonary/Critical Care Medicine 04/28/2023 8:44 AM   Please see Amion for pager number to reach on-call Pulmonary and Critical Care Team.

## 2023-04-29 ENCOUNTER — Inpatient Hospital Stay (HOSPITAL_COMMUNITY)

## 2023-04-29 ENCOUNTER — Telehealth (HOSPITAL_COMMUNITY): Payer: Self-pay

## 2023-04-29 ENCOUNTER — Other Ambulatory Visit (HOSPITAL_COMMUNITY): Payer: Self-pay

## 2023-04-29 DIAGNOSIS — C8339 Primary central nervous system lymphoma: Secondary | ICD-10-CM | POA: Diagnosis not present

## 2023-04-29 DIAGNOSIS — R001 Bradycardia, unspecified: Secondary | ICD-10-CM | POA: Diagnosis not present

## 2023-04-29 DIAGNOSIS — D649 Anemia, unspecified: Secondary | ICD-10-CM

## 2023-04-29 DIAGNOSIS — J9600 Acute respiratory failure, unspecified whether with hypoxia or hypercapnia: Secondary | ICD-10-CM | POA: Diagnosis not present

## 2023-04-29 DIAGNOSIS — R569 Unspecified convulsions: Secondary | ICD-10-CM | POA: Diagnosis not present

## 2023-04-29 DIAGNOSIS — I10 Essential (primary) hypertension: Secondary | ICD-10-CM | POA: Diagnosis not present

## 2023-04-29 DIAGNOSIS — G936 Cerebral edema: Secondary | ICD-10-CM

## 2023-04-29 DIAGNOSIS — G40909 Epilepsy, unspecified, not intractable, without status epilepticus: Secondary | ICD-10-CM | POA: Diagnosis not present

## 2023-04-29 DIAGNOSIS — G40109 Localization-related (focal) (partial) symptomatic epilepsy and epileptic syndromes with simple partial seizures, not intractable, without status epilepticus: Secondary | ICD-10-CM | POA: Diagnosis not present

## 2023-04-29 LAB — CBC
HCT: 40.1 % (ref 39.0–52.0)
Hemoglobin: 13.3 g/dL (ref 13.0–17.0)
MCH: 30.4 pg (ref 26.0–34.0)
MCHC: 33.2 g/dL (ref 30.0–36.0)
MCV: 91.6 fL (ref 80.0–100.0)
Platelets: 139 10*3/uL — ABNORMAL LOW (ref 150–400)
RBC: 4.38 MIL/uL (ref 4.22–5.81)
RDW: 13.2 % (ref 11.5–15.5)
WBC: 3.6 10*3/uL — ABNORMAL LOW (ref 4.0–10.5)
nRBC: 0.6 % — ABNORMAL HIGH (ref 0.0–0.2)

## 2023-04-29 LAB — BASIC METABOLIC PANEL
Anion gap: 10 (ref 5–15)
BUN: 22 mg/dL (ref 8–23)
CO2: 25 mmol/L (ref 22–32)
Calcium: 8.1 mg/dL — ABNORMAL LOW (ref 8.9–10.3)
Chloride: 102 mmol/L (ref 98–111)
Creatinine, Ser: 0.88 mg/dL (ref 0.61–1.24)
GFR, Estimated: >60 mL/min (ref >60)
Glucose, Bld: 140 mg/dL — ABNORMAL HIGH (ref 70–99)
Potassium: 4 mmol/L (ref 3.5–5.1)
Sodium: 137 mmol/L (ref 135–145)

## 2023-04-29 LAB — RESPIRATORY PANEL BY PCR

## 2023-04-29 LAB — GLUCOSE, CAPILLARY
Glucose-Capillary: 173 mg/dL — ABNORMAL HIGH (ref 70–99)
Glucose-Capillary: 131 mg/dL — ABNORMAL HIGH (ref 70–99)
Glucose-Capillary: 123 mg/dL — ABNORMAL HIGH (ref 70–99)
Glucose-Capillary: 130 mg/dL — ABNORMAL HIGH (ref 70–99)
Glucose-Capillary: 134 mg/dL — ABNORMAL HIGH (ref 70–99)
Glucose-Capillary: 137 mg/dL — ABNORMAL HIGH (ref 70–99)

## 2023-04-29 LAB — PHOSPHORUS
Phosphorus: 2.6 mg/dL (ref 2.5–4.6)
Phosphorus: 2.4 mg/dL — ABNORMAL LOW (ref 2.5–4.6)

## 2023-04-29 LAB — MAGNESIUM
Magnesium: 1.8 mg/dL (ref 1.7–2.4)
Magnesium: 2.1 mg/dL (ref 1.7–2.4)

## 2023-04-29 MED ORDER — ENSURE ENLIVE PO LIQD
237.0000 mL | Freq: Two times a day (BID) | ORAL | Status: DC
  Administered 2023-04-29: 237 mL

## 2023-04-29 MED ORDER — POLYETHYLENE GLYCOL 3350 17 G PO PACK
17.0000 g | PACK | Freq: Every day | ORAL | Status: DC | PRN
  Administered 2023-05-22: 17 g
  Filled 2023-04-29: qty 1

## 2023-04-29 MED ORDER — SODIUM CHLORIDE 0.9 % IV SOLN: 8.0000 mg | Freq: Three times a day (TID) | INTRAVENOUS | Status: DC | PRN

## 2023-04-29 MED ORDER — SENNOSIDES-DOCUSATE SODIUM 8.6-50 MG PO TABS
1.0000 | ORAL_TABLET | Freq: Every evening | ORAL | Status: DC | PRN
  Administered 2023-05-23: 1
  Filled 2023-04-29 (×2): qty 1

## 2023-04-29 MED ORDER — SODIUM CHLORIDE 0.9 % IV SOLN
50.0000 mg | Freq: Once | INTRAVENOUS | Status: AC
  Administered 2023-04-29: 50 mg via INTRAVENOUS
  Filled 2023-04-29: qty 5

## 2023-04-29 MED ORDER — SODIUM CHLORIDE 0.9 % IV SOLN
100.0000 mg | Freq: Two times a day (BID) | INTRAVENOUS | Status: DC
  Administered 2023-04-29 – 2023-04-30 (×3): 100 mg via INTRAVENOUS
  Filled 2023-04-29 (×5): qty 10

## 2023-04-29 MED ORDER — MAGNESIUM SULFATE 2 GM/50ML IV SOLN
2.0000 g | Freq: Once | INTRAVENOUS | Status: AC
  Administered 2023-04-29: 2 g via INTRAVENOUS
  Filled 2023-04-29: qty 50

## 2023-04-29 MED ORDER — ONDANSETRON HCL 4 MG/2ML IJ SOLN: 4.0000 mg | Freq: Three times a day (TID) | INTRAMUSCULAR | Status: DC | PRN

## 2023-04-29 MED ORDER — LEVETIRACETAM 100 MG/ML PO SOLN
2000.0000 mg | Freq: Two times a day (BID) | ORAL | Status: DC
  Administered 2023-04-29 – 2023-05-02 (×6): 2000 mg
  Filled 2023-04-29 (×7): qty 20

## 2023-04-29 MED ORDER — ONDANSETRON HCL 4 MG PO TABS
4.0000 mg | ORAL_TABLET | Freq: Three times a day (TID) | ORAL | Status: DC | PRN
  Administered 2023-05-12 – 2023-05-13 (×2): 8 mg
  Filled 2023-04-29 (×2): qty 2

## 2023-04-29 MED ORDER — ONDANSETRON 4 MG PO TBDP
4.0000 mg | ORAL_TABLET | Freq: Three times a day (TID) | ORAL | Status: DC | PRN
Start: 2023-04-29 — End: 2023-05-15

## 2023-04-29 NOTE — Progress Notes (Signed)
 Subjective: Febrile overnight with Tmax 101.6 Fahrenheit.  Also had multiple episodes of left facial twitching consistent with focal motor seizure.  Had 1 episode while I was in the room with him.  Was able to communicate with me and follow commands during the episode.  1 seizure ended aspirin I can let him go home.  ROS: negative except above  Examination  Vital signs in last 24 hours: Temp:  [98 F (36.7 C)-101.6 F (38.7 C)] 98 F (36.7 C) (03/04 0800) Pulse Rate:  [65-102] 83 (03/04 0800) Resp:  [14-31] 18 (03/04 0800) BP: (125-172)/(70-110) 136/93 (03/04 0800) SpO2:  [97 %-100 %] 98 % (03/04 0800) Weight:  [89.1 kg] 89.1 kg (03/04 0450)  General: lying in bed, NAD Neuro: Alert, oriented, follows commands, cranial nerves appear grossly intact, 5/5 in right upper and right lower extremity, 4/5 in left lower extremity, 3/5 in left upper extremity  Basic Metabolic Panel: Recent Labs  Lab 04/25/23 2245 04/26/23 0500 04/26/23 1605 04/27/23 0432 04/28/23 1120 04/28/23 2124 04/29/23 0432  NA 139 138  --  140 138  --  137  K 3.6 3.3*  --  3.6 4.2  --  4.0  CL 93* 97*  --  104 100  --  102  CO2 34* 30  --  28 29  --  25  GLUCOSE 106* 149*  --  112* 105*  --  140*  BUN 21 21  --  26* 18  --  22  CREATININE 1.26* 1.08  --  0.93 1.07  --  0.88  CALCIUM 8.3* 8.0*  --  8.0* 8.2*  --  8.1*  MG  --   --  2.1 2.0 1.9 1.9 1.8  PHOS  --   --  3.7 2.9 3.2 2.7 2.6    CBC: Recent Labs  Lab 04/23/23 0501 04/24/23 0611 04/25/23 0528 04/25/23 2245 04/26/23 0500 04/27/23 0432 04/28/23 0352 04/29/23 0432  WBC 5.2 6.6 7.7 4.9 4.6 4.3 2.5* 3.6*  NEUTROABS 3.8 5.5 5.7  --  4.2 3.4  --   --   HGB 13.0 13.4 13.3 13.1 12.9* 12.4* 12.9* 13.3  HCT 38.9* 39.8 39.6 40.5 40.0 38.3* 40.3 40.1  MCV 89.8 89.2 90.2 92.7 93.0 92.3 93.9 91.6  PLT 135* 148* 134* 142* 149* 140* 138* 139*     Coagulation Studies: No results for input(s): "LABPROT", "INR" in the last 72 hours.  Imaging No new  brain imaging overnight   ASSESSMENT AND PLAN:  73 year old male with history of A-fib not on anticoagulation due to bleeding risk, prostate cancer status post prostatectomy, PE, hep B exposure, CNS recent labs of large B-cell lymphoma, focal seizures on Keppra who was admitted for inpatient chemo and was noted to have left facial twitching consistent with focal seizures.   Epilepsy with breakthrough seizure Cerebral edema B-cell lymphoma -In the setting of underlying lymphoma and edema   Recommendations -Due to multiple seizures overnight, will increase vimpat to 100mg  BID and keppra to 2000mg  BID -Continue video EEG to monitor for intermittent seizures -If any further seizures, can increase Vimpat and add perampanel 12mg  -Discussed plan with Dr. Everardo All via secure chat -Continue seizure precautions -As needed IV Ativan for clinical seizures   I have spent a total of  36  minutes with the patient reviewing hospital notes,  test results, labs and examining the patient as well as establishing an assessment and plan.  > 50% of time was spent in direct patient care.  Lindie Spruce Epilepsy [facility] For questions after 5pm please refer to AMION to reach the Neurologist on call

## 2023-04-29 NOTE — Progress Notes (Signed)
 [facility] ADULT ICU REPLACEMENT PROTOCOL   The patient does apply for the [facility] Adult ICU Electrolyte Replacment Protocol based on the criteria listed below:   1.Exclusion criteria: TCTS, ECMO, Dialysis, and Myasthenia Gravis patients 2. Is GFR >/= 30 ml/min? Yes.    Patient's GFR today is >60  3. Is SCr </= 2? Yes.   Patient's SCr is 0.88 mg/dL 4. Did SCr increase >/= 0.5 in 24 hours? No. 5.Pt's weight >40kg  Yes.   6. Abnormal electrolyte(s): Mg = 1.8  7. Electrolytes replaced per protocol 8.  Call MD STAT for K+ </= 2.5, Phos </= 1, or Mag </= 1 Physician:  Vladimir Faster, eMD   Faye Ramsay 04/29/2023 5:35 AM

## 2023-04-29 NOTE — Progress Notes (Signed)
 Speech Language Pathology Treatment: Dysphagia;Cognitive-Linquistic  Patient Details Name: Tyler [name] MRN: [id number] DOB: 08-07-1950 Today's Date: 04/29/2023 Time: 0927-1006 SLP Time Calculation (min) (ACUTE ONLY): 39 min  Assessment / Plan / Recommendation   Clinical Impression  Pt seen for skilled SLP session to address speech and swallowing goals. Per notes, pt made NPO by nursing yesterday due do impulsive drinking and subsequent coughing with sips of thin liquids. RN this morning reported pt is tolerating sips of thin liquids better with further direction and intervention per RN.   Speech: Pt's verbal responses at the phrase and sentence level continue to be largely unintelligible. Improved communication success when pt was redirected to state 1-2 words at a time. Recommend addressing vowel production exercises and automatic sequences next session to work on oral muscle control to form clear phonemes and words.   Swallowing: Pt initially attempted to impulsively drink large cup sips which resulted in significant anterior spillage from L side of mouth. SLP attempted verbal education about pt's oral deficits; however, this was not successful. He did appear to understand oral deficits more when SLP held phone camera up for pt to see mouth twitching and tongue twitching. He was more receptive to verbal cues for the remainder of the session.   Pt consumed sips of thin liquids by cup (SLP filled cup only a quarter of the way to reduce impulsive drinking) totaling ~10-12 oz, applesauce (4 oz), and pudding (4 oz). Only overt s/s of aspiration observed was following initial, large, impulsive cups sips at start of session. No further overt or subtle s/s of aspiration observed when pt drank smaller amounts.   Pt continues to exhibit anterior spillage from L side. Reduced lingual movement and control observed with some lingual vesiculations on L side today. Improved oral transit of pureed solids  observed compared to observations on MBSS. Pt able to clear trials with mod verbal cues to "focus" and "move your tongue back". With some trials, pt required up to one minute to clear the bolus. With other trials, pt was able to efficiently clear bolus in seconds.   Diet advanced to full liquids, thin liquids with strict 1:1 supervision and reinforcement of aspiration precautions. Precautions outlined below in "recommendations" section and posted in room. SLP will continue to address PoC     HPI HPI: 73 yo male [facility] with LUE and LE weakness, and change in mentation. Head CT of brain shows a recurrent tumor at the craniotomy site and increased size of recurrent tumor along with a left midline shift up to 6 mm. 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 Pt's swallowing was evaluated when he was admitted in June of 2024 - he was D/Cd on a dysphagia 3 diet with thin liquids with no f/u recommended. Pt's swallowing assessed again on 04/08/23 this admission with recommendation of a regular diet and thin liquids. Hospital course complicated by concern for neck swelling, acute dysphagia, and concern for airway protection prompting intubation from 02/28-03/02. Neck CT on 04/26/23: demonstrated "No acute finding. Assessment of mucosal surfaces limited by airway  collapse in the setting of intubation.".      SLP Plan  Continue with current plan of care      Recommendations for follow up therapy are one component of a multi-disciplinary discharge planning process,  led by the attending physician.  Recommendations may be updated based on patient status, additional functional criteria and insurance authorization.    Recommendations  Diet recommendations: Thin liquid (Full  liquids) Liquids provided via: Cup. No straw Medication Administration: Via alternative means Supervision: Staff to assist with self feeding;Full supervision/cueing for compensatory strategies Compensations: Slow rate;Small sips/bites;Monitor for anterior loss;Minimize environmental distractions (verbal cues to "pump tongue" and "focus on swallowing") Postural Changes and/or Swallow Maneuvers: Upright 30-60 min after meal;Seated upright 90 degrees                  Oral care BID   Frequent or constant Supervision/Assistance Dysphagia, oropharyngeal phase (R13.12);Apraxia (R48.2);Aphasia (R47.01);Dysarthria and anarthria (R47.1);Attention and concentration deficit     Continue with current plan of care     [name]  04/29/2023, 10:24 AM

## 2023-04-29 NOTE — Telephone Encounter (Signed)
 Pharmacy Patient Advocate Encounter  Insurance verification completed.    The patient is insured through [employer] and [facility] MEDICAID.    Ran test claim for Lacosmide 50 mcg t and the current 30 day co-pay is 0.00.   This test claim was processed through [facility]- copay amounts may vary at other pharmacies due to pharmacy/plan contracts, or as the patient moves through the different stages of their insurance plan.

## 2023-04-29 NOTE — Consult Note (Signed)
 NAME:  Tyler [name], MRN:  [id number], DOB:  1950-07-25, LOS: 7 ADMISSION DATE:  04/22/2023, CONSULTATION/ SERVICE  DATE:  04/25/23 REFERRING MD:  Dr. Candise Che, CHIEF COMPLAINT: Neck swelling, drooling, difficulty swallowing, altered mental status with concern for partial seizures  History of Present Illness:  Tyler [name] is a 73 year old gentleman with significant history of CNS relapse of large B-cell lymphoma, partial seizure who was admitted to cardiology service on 2/25 for second cycle of high-dose methotrexate with leucovorin rescue.  Patient also has prior history of paroxysmal atrial fibrillation not on anticoagulation due to bleeding risk, prostate cancer status post prostatectomy in 2021, pulmonary embolism, hepatitis B exposure.  Patient had high-grade large B-cell lymphoma and completed 6 cycles of R-CHOP with systemic remission in the past.  As per the primary oncologist, patient was doing well and receiving his medication as planned.  He was started on bicarb drip to minimize the methotrexate related toxicity.  However, this evening, oncologist noted increased neck swelling as well as patient had difficulty swallowing.  He was also noted to have drooling of saliva and developed altered mental status with facial twitching concerning for seizure activity.  There was concern about patient's ability to protect airway and possible angioedema (neck swelling), ED was consulted and patient was intubated emergently.  At the time of my evaluation, patient was intubated and unresponsive.  No family member at the bedside.  Rest of the information is as per the chart review and I was unable to independently verify with the patient.   PAST    has a past medical history of Cancer (HCC), Follicular lymphoma (HCC) (2024), History of pulmonary embolism, Hypertension, Myelodysplastic syndrome (HCC), PAF (paroxysmal atrial fibrillation) (HCC), and Recurrent UTI.   has a past surgical history that includes  Robot assisted laparoscopic radical prostatectomy (N/A, 09/01/2019); Lymphadenectomy (Bilateral, 09/01/2019); Esophagogastroduodenoscopy (egd) with propofol (N/A, 08/21/2022); biopsy (08/21/2022); IR IMAGING GUIDED PORT INSERTION (08/26/2022); Craniotomy (Right, 03/10/2023); and Application of cranial navigation (Right, 03/10/2023).   Significant events   04/22/2023 - ADMIT  2/27  - MRI brain - Marked interval decrease in size of right parietal mass/lymphoma, now measuring 2.9 x 3.5 x 2.3 cm (previously 5.4 x 3.6 x 4.4 cm), consistent with interval response to therapy. Persistent but improved vasogenic edema within the adjacent posterior right cerebral hemisphere, with interval resolution of previously seen right-to-left midline shift. 2. 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 Correlation with symptomatology and EEG recommended. Evolving changes of subacute ischemia would be the primary differential consideration, but are felt to be less likely.  2/28 - Transferred to ICU and intubated. Witnessed by Dr Candise Che with have left neck swelling, gurgling, and twitching left side -> then partially respnsvive -> ativan/confused -> EDP intubated. Around 7.30-8pm  04/26/23: Coretrack placed early hours .Intubated early hours.  On the ventilator FiO2 50%.  Low-grade fever since 04/17/2023 but normal white count. Ceribell EEG oongoing. Prmary language for aptient is Swahili. SBP HIGH with HR 40s - on scheduled lopressor . Ceribel shows    ceribell EEG is is suggestive of severe diffuse encephalopathy. No seizures were seen throughout the recording.   - Blood clture  - Tracheal  aspirate  3/2 Extubated. Remains on PANDA. Scheduled for barium today. Remains on cEEG per Neuro   SUBJECTIVE/OVERNIGHT/INTERVAL HX   Updated daughter yesterday Tmax 101.6, currently afebrile Able to answer simple questions in English  this morning  Objective  Blood pressure (!) 136/93, pulse 83, temperature 98 F (36.7 C), temperature source Oral, resp. rate 18, height 5\' 7"  (1.702 m), weight 89.1 kg, SpO2 98%.        Intake/Output Summary (Last 24 hours) at 04/29/2023 0915 Last data filed at 04/29/2023 0700 Gross per 24 hour  Intake 1055.83 ml  Output 950 ml  Net 105.83 ml   Filed Weights   04/27/23 0500 04/28/23 0500 04/29/23 0450  Weight: 85.5 kg 89.2 kg 89.1 kg   Physical Exam: General: Well-appearing, no acute distress HENT: [place], AT, OP clear, MMM Eyes: EOMI, no scleral icterus Respiratory: Clear to auscultation bilaterally.  No crackles, wheezing or rales Cardiovascular: RRR, -M/R/G, no JVD GI: BS+, soft, nontender Extremities:-Edema,-tenderness Neuro: Awake and alert, CNII-XII grossly intact  Leukopenia slightly improving BMET ok Mg 1.8   Assessment & Plan:   Acute respiratory failure 04/25/2023 evening associated with neck swelling  (likely fat tissue chages with seizures) and seizures and  postictal state  - s/p Intubation .   - Had neck swelling at time bu CT neck is normal and exam 3/1 - is fat tissue P:   Extubated 3/2 Passed barium however impulsive/choking. 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 S/p chemotherapy R-CHOP.  S/p craniotomy with gross total resection 03/10/23.  - CT of head 04/07/2023 showed interval increase in size of tumor at the craniotomy site. MRI of head 04/07/2023 increase in size of tumor with slightly increased surrounding edema. MRI brain 2/27  - improvmeent - Currently on Hi-dose methotrexate chemotherapy regimen which is more aggressive approach.  Received cycle 1 on 04/09/23 and admitted for cycle 2 of HIGH DOSE Methotrexate chemotherapy regimen,  completed 04/23/2023. P: Oncology following  - Now  methotrexate level < 0.1. Off sodium bicarb, off leucovorin. No longer monitoring urine PH --PO dexamethasone for brain edema - IV Rituxan as outpatient between 2nd and 3rd cycle of methotrexate and subsequently between treatment cycles. Plan for first week of March 2025 if discharged - can be delayed if needed  Partial sensory seizures, left side Acute encephalopathy  -on 04/25/2203 felt due to CNS lymphoma with edema -04/25/2023: Morning reports of relatively normal mental status but at night sudden worsening with obtundation and potential seizures and intubated. MRI Mass improvd but 2/27 - New diffusion signal abnormality involving the right insular cortex and overlying right frontal lobe, favored to reflect sequelae of seizure -3/2 - doing wake up exam without seizures P:  Neurology following. Managing AEDs. Increased home keppra. Vimpat added Continuous EEG Neuro checks  Hypertensive and Bradycardic on lopressor 04/26/23 Normal echo 04/26/23 P:  DC lopressor due to bradycardia 3/2 Start scheduled hydralazine 3/3 Tele  Hep B antibody positive, chronic P: On tenofovir for hep B reactivation suppression in context of Rituxan use. Plan is to continue for 6 months after his last chemotherapy. Follow ID outpatient  Fever Tmax 102 on 3/3. Procal neg P: Monitor fever curve/WBC off antibiotics Cultures NGTD F/u extended RVP   Mild anemia of critical illness onset less than 13 g% 04/26/2023 P:  Trend Transfuse if needed for Hg goal >7  Mild thrombocytopenia - mild P: Lovenox 40 mg every 24 hours    At risk for hypo and hyperglycemia P:   SSI Tube feeds   Best practice:  Diet: TF  Pain/Anxiety/Delirium protocol (if indicated): n/a VAP protocol (if  indicated): n/a DVT prophylaxis: lovnox GI prophylaxis: Home PPI Glucose control: ssi Mobility: bed rest Code Status: full Family Communication:   3/1 - Daughter Sherron Flemings - > [phone] => updated her over phone. Also d/w  Dr Candise Che  3/2 - Daughter Sherron Flemings -> upated over phone.  Explained need to go to [facility] 04/27/23 for c-EEG  3/3 - Updated daughter at bedside  3/4 Updated daughter by phone   ATTESTATION & SIGNATURE   The patient is critically ill with multiple organ systems failure and requires high complexity decision making for assessment and support, frequent evaluation and titration of therapies, application of advanced monitoring technologies and extensive interpretation of multiple databases.  Independent Critical Care Time: 35 Minutes.   Mechele Collin, M.D. [facility] Pulmonary/Critical Care Medicine 04/29/2023 9:16 AM   Please see Amion for pager number to reach on-call Pulmonary and Critical Care Team.

## 2023-04-29 NOTE — Procedures (Addendum)
 Patient Name: Tyler [name]  MRN: [id number]  Epilepsy Attending: Charlsie Quest  Referring Physician/Provider: Gordy Councilman, MD  Duration: 04/28/2023 1608 to 04/29/2023 1608   Patient history: 73yo M patient with 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 EEG to evaluate for seizure    Level of alertness: Awake/lethargic, asleep   AEDs during EEG study: LEV, LCM   Technical aspects: This EEG study was done with scalp electrodes positioned according to the 10-20 International system of electrode placement. Electrical activity was reviewed with band pass filter of 1-70Hz , sensitivity of 7 uV/mm, display speed of 24mm/sec with a 60Hz  notched filter applied as appropriate. EEG data were recorded continuously and digitally stored.  Video monitoring was available and reviewed as appropriate.   Description: No clear posterior dominant rhythm was seen.  Sleep was characterized by sleep spindles (12 to 40 Hz), maximal frontocentral region.  EEG showed continuous generalized and maximal right parietal 3 to 6 Hz theta-delta slowing. Lateralized periodic discharges were noted in right hemisphere, maximal right parietal region with fluctuating frequency of 1 to 2.5 Hz , at times with overlying rhythmicity. Hyperventilation and photic stimulation were not performed.     Event button was pressed on 04/28/2023 at 1837 and 1925 and on 04/29/2023 at 0143, 0549, 0608 and 0920 for left facial twitching. Concomitant EEG showed lateralized periodic discharges with overlying rhythmicity in right hemisphere, maximal right parietal region which evolved in frequency from 5 Hz to 2 to 3 Hz and appeared more sharply contoured.  Duration of seizures was about 1 minute on average.   ABNORMALITY -Focal seizure, right parietal region -Lateralized periodic discharges with overlying rhythmicity, right hemisphere, maximal right parietal  region ( LPD+R) -Continuous slow, generalized and maximal right parietal region    IMPRESSION: This study showed focal motor seizures during which patient had left facial twitching arising from right parietal region. Seizures were noted on 04/28/2023 at 1837 and 1925 and on 04/29/2023 at 0143, 0549, 0608 and 0920 lasting 1 minute on average.      Additionally there is evidence of epileptogenicity arising from right hemisphere, maximal right parietal region which is on the ictal-interictal continuum with higher likelihood of ictal nature due to the overlying rhythmicity and frequency reaching up to 2.5 Hz.    Lastly there is cortical dysfunction in right hemisphere, maximal right parietal region likely secondary to underlying structural abnormality as well as moderate diffuse encephalopathy.     Tyler [name]

## 2023-04-29 NOTE — Progress Notes (Signed)
 vLTM maintenance  All impedances below 10k  No skin breakdown noted at FP1  P4  C4  F4

## 2023-04-29 NOTE — Consult Note (Addendum)
 Physical Medicine and Rehabilitation Consult Reason for Consult:Rehab Referring Physician: Dr. Everardo All   HPI: Tyler [name] is a 73 y.o. male with past medical history of PAF, prostate cancer s/p prostatectomy, alcohol abuse, prior PE, hepatitis B, B-cell lymphoma who completed 6 cycles of R-CHOP.  Relapsed with parenchymal lesion in the brain, completed high-dose methotrexate and leucovorin rescue for his treatment with resulting elevation in liver enzymes.  Had a partial seizure treated with Keppra.  He was admitted for second cycle of high-dose methotrexate 2/25.  On 2/28 he developed increased neck swelling, difficulty swallowing, altered mental status and partial complex with twitching of the left side and was intubated emergently.  He had a core track placed.  EEG with severe diffuse encephalopathy.  He was extubated on 3/2.  Plan for IV Rituxan treatment and later third cycle of methotrexate.  He is getting p.o. dexamethasone for brain edema.  He is having continued facial twitches consistent with seizures and is on continuous EEG, neurology following.  Patient says he wants to go home.  Patient lives with his wife who can assist 24/7.  He lives in a second floor apartment.   Review of Systems  Constitutional:  Positive for fever and malaise/fatigue.  HENT:  Negative for congestion and hearing loss.   Eyes:  Negative for double vision.  Respiratory:  Negative for shortness of breath.   Cardiovascular:  Negative for chest pain.  Gastrointestinal:  Negative for abdominal pain and constipation.  Musculoskeletal:  Negative for joint pain.  Skin:  Negative for rash.  Neurological:  Positive for seizures. Negative for sensory change, focal weakness and headaches.   Past Medical History:  Diagnosis Date   Cancer [facility])    prostate   Follicular lymphoma (HCC) 2024   History of pulmonary embolism    Hypertension    Myelodysplastic syndrome (HCC)    PAF (paroxysmal atrial  fibrillation) (HCC)    Recurrent UTI    Past Surgical History:  Procedure Laterality Date   APPLICATION OF CRANIAL NAVIGATION Right 03/10/2023   Procedure: APPLICATION OF CRANIAL NAVIGATION;  Surgeon: Tressie Stalker, MD;  Location: [facility] OR;  Service: Neurosurgery;  Laterality: Right;   BIOPSY  08/21/2022   Procedure: BIOPSY;  Surgeon: Beverley Fiedler, MD;  Location: [facility] ENDOSCOPY;  Service: Gastroenterology;;   CRANIOTOMY Right 03/10/2023   Procedure: PARIETAL CRANIOTOMY;  Surgeon: Tressie Stalker, MD;  Location: [facility] OR;  Service: Neurosurgery;  Laterality: Right;   ESOPHAGOGASTRODUODENOSCOPY (EGD) WITH PROPOFOL N/A 08/21/2022   Procedure: ESOPHAGOGASTRODUODENOSCOPY (EGD) WITH PROPOFOL;  Surgeon: Beverley Fiedler, MD;  Location: [facility] ENDOSCOPY;  Service: Gastroenterology;  Laterality: N/A;   IR IMAGING GUIDED PORT INSERTION  08/26/2022   LYMPHADENECTOMY Bilateral 09/01/2019   Procedure: LYMPHADENECTOMY;  Surgeon: Sebastian Ache, MD;  Location: [facility] ORS;  Service: Urology;  Laterality: Bilateral;   ROBOT ASSISTED LAPAROSCOPIC RADICAL PROSTATECTOMY N/A 09/01/2019   Procedure: XI ROBOTIC ASSISTED LAPAROSCOPIC RADICAL PROSTATECTOMY;  Surgeon: Sebastian Ache, MD;  Location: [facility] ORS;  Service: Urology;  Laterality: N/A;  3 HRS   Family History  Problem Relation Age of Onset   Stroke Father    Social History:  reports that he has never smoked. He has never used smokeless tobacco. He reports current alcohol use. He reports that he does not use drugs. Allergies:  Allergies  Allergen Reactions   Amitriptyline Other (See Comments)    Nightmares, Night sweats, Migraines    Amlodipine Swelling    BILATERAL FEET TO ANKLES  Neurontin [Gabapentin] Other (See Comments)    Nightmares; Night sweats; Headache   Medications Prior to Admission  Medication Sig Dispense Refill   B Complex Vitamins (VITAMIN B COMPLEX) TABS Take 1 tablet by mouth daily. (Patient taking differently: Take 1 tablet by mouth in the  morning.) 30 tablet 0   cholecalciferol (VITAMIN D3) 25 MCG (1000 UNIT) tablet Take 2 tablets (2,000 Units total) by mouth daily. (Patient taking differently: Take 1,000 Units by mouth in the morning.)     dexamethasone (DECADRON) 4 MG tablet Take 1 tablet (4 mg total) by mouth 2 (two) times daily with breakfast and lunch. 60 tablet 0   folic acid (FOLVITE) 1 MG tablet Take 1 mg by mouth in the morning.     HYDROcodone-acetaminophen (NORCO/VICODIN) 5-325 MG tablet Take 1 tablet by mouth every 6 (six) hours as needed for moderate pain (pain score 4-6). 20 tablet 0   levETIRAcetam (KEPPRA) 500 MG tablet Take 1 tablet (500 mg total) by mouth 2 (two) times daily. 60 tablet 1   metoprolol succinate (TOPROL XL) 25 MG 24 hr tablet Take 1 tablet (25 mg total) by mouth daily. (Patient taking differently: Take 25 mg by mouth in the morning.) 90 tablet 3   pantoprazole (PROTONIX) 40 MG tablet Take 1 tablet (40 mg total) by mouth 2 (two) times daily. For 12 weeks 60 tablet 2   VEMLIDY 25 MG tablet TAKE 1 TABLET BY MOUTH 1 TIME A DAY (Patient taking differently: Take 25 mg by mouth in the morning.) 30 tablet 1   docusate sodium (COLACE) 100 MG capsule Take 1 capsule (100 mg total) by mouth 2 (two) times daily. (Patient not taking: Reported on 04/22/2023) 10 capsule 0   furosemide (LASIX) 20 MG tablet Take 1 tablet (20 mg total) by mouth daily. (Patient not taking: Reported on 04/22/2023) 90 tablet 1   lidocaine-prilocaine (EMLA) cream Apply to affected area once (Patient not taking: Reported on 04/22/2023) 30 g 3   Menthol-Methyl Salicylate (MUSCLE RUB) 10-15 % CREA Apply 1 Application topically daily as needed for muscle pain. (Patient not taking: Reported on 04/22/2023)     ondansetron (ZOFRAN) 8 MG tablet Take 1 tablet (8 mg total) by mouth every 8 (eight) hours as needed for nausea or vomiting. Start on the third day after cyclophosphamide chemotherapy. (Patient not taking: Reported on 04/22/2023) 30 tablet 1     Home: Home Living Family/patient expects to be discharged to:: Private residence Living Arrangements: Spouse/significant other Available Help at Discharge: Family Type of Home: Apartment Home Access: Level entry Entrance Stairs-Number of Steps: none Entrance Stairs-Rails: Right, Left Home Layout: One level Bathroom Shower/Tub: Engineer, manufacturing systems: Standard Bathroom Accessibility: Yes Home Equipment: Agricultural consultant (2 wheels), BSC/3in1, Shower seat  Functional History: Prior Function Prior Level of Function : Independent/Modified Independent Mobility Comments: occasional RW use ADLs Comments: Independent to Mod I with occasional assistance from children Functional Status:  Mobility: Bed Mobility Overal bed mobility: Needs Assistance Bed Mobility: Supine to Sit, Sit to Supine Supine to sit: Mod assist, +2 for safety/equipment Sit to supine: Min assist General bed mobility comments: Mod A for tactile cues to sequence to come to EOB and light asisst for truncal elevation. min A for repositioning once back in bed Transfers Overall transfer level: Needs assistance Equipment used: None Transfers: Sit to/from Stand Sit to Stand: Min assist, +2 safety/equipment Bed to/from chair/wheelchair/BSC transfer type:: Step pivot Step pivot transfers: Min assist, +2 safety/equipment General transfer comment: increaesd time  to initiate STS Ambulation/Gait General Gait Details: unable due to EEG    ADL: ADL Overall ADL's : Needs assistance/impaired Grooming: Minimal assistance, Sitting Upper Body Bathing: Sitting, Minimal assistance Lower Body Bathing: Maximal assistance, Sit to/from stand Upper Body Dressing : Minimal assistance, Sitting, Cueing for sequencing Lower Body Dressing: Maximal assistance, Sit to/from stand Toilet Transfer: Minimal assistance, +2 for safety/equipment, BSC/3in1, Stand-pivot Toileting- Clothing Manipulation and Hygiene: Total  assistance Toileting - Clothing Manipulation Details (indicate cue type and reason): tech provided pericare Functional mobility during ADLs: Minimal assistance  Cognition: Cognition Overall Cognitive Status: Impaired/Different from baseline Arousal/Alertness: Awake/alert Orientation Level: Oriented to person, Oriented to place Attention: Focused Focused Attention: Impaired Focused Attention Impairment: Verbal basic Awareness: Impaired Awareness Impairment: Intellectual impairment Safety/Judgment: Impaired Cognition Arousal: Alert Behavior During Therapy: Flat affect Overall Cognitive Status: Impaired/Different from baseline  Blood pressure (!) 136/93, pulse 83, temperature 98 F (36.7 C), temperature source Oral, resp. rate 18, height 5\' 7"  (1.702 m), weight 89.1 kg, SpO2 98%. Physical Exam  General: No apparent distress, + L facial twitching  HEENT: Head is normocephalic, atraumatic, sclera anicteric, oral mucosa pink and moist, + NGT, + Left twitching noted in his face,  continuous EEG in place. Increased oral secretions Neck: Supple without JVD or lymphadenopathy Heart: Reg rate and rhythm.  Chest: CTA bilaterally without wheezes, rales, or rhonchi; no distress Abdomen: Soft, non-tender, + distended, bowel sounds positive. Extremities: No clubbing, cyanosis, or edema. Pulses are 2+ Psych: Pt's affect is appropriate. Pt is cooperative Skin: Clean and intact without signs of breakdown Neuro:    Mental Status: AAO person, hospital, year and month Speech/Languate: Speech often difficult to understand, able to understand intermittently when he speaks 1 word at a time slowly, follows simple commands with some cues, decreased insight CRANIAL NERVES: 2-12 grossly intact-twitching on left face   MOTOR: RUE: 5/5 Deltoid, 5/5 Biceps, 5/5 Triceps,5/5 Grip LUE: 4/5 Deltoid, 4/5 Biceps, 4/5 Triceps, 4/5 Grip RLE: HF 5/5, KE 5/5, ADF 5/5, APF 5/5 LLE: HF 4/5, KE 4/5, ADF 4/5, APF  4/5   REFLEXES: No clonus close  SENSORY: Normal to touch all 4 extremities  Coordination: Normal finger to nose     Results for orders placed or performed during the hospital encounter of 04/22/23 (from the past 24 hours)  Glucose, capillary     Status: None   Collection Time: 04/28/23 12:01 PM  Result Value Ref Range   Glucose-Capillary 96 70 - 99 mg/dL  Glucose, capillary     Status: Abnormal   Collection Time: 04/28/23  3:38 PM  Result Value Ref Range   Glucose-Capillary 149 (H) 70 - 99 mg/dL  Glucose, capillary     Status: Abnormal   Collection Time: 04/28/23  7:31 PM  Result Value Ref Range   Glucose-Capillary 130 (H) 70 - 99 mg/dL  Magnesium     Status: None   Collection Time: 04/28/23  9:24 PM  Result Value Ref Range   Magnesium 1.9 1.7 - 2.4 mg/dL  Phosphorus     Status: None   Collection Time: 04/28/23  9:24 PM  Result Value Ref Range   Phosphorus 2.7 2.5 - 4.6 mg/dL  Glucose, capillary     Status: Abnormal   Collection Time: 04/28/23 11:26 PM  Result Value Ref Range   Glucose-Capillary 101 (H) 70 - 99 mg/dL  Glucose, capillary     Status: Abnormal   Collection Time: 04/29/23  3:28 AM  Result Value Ref Range   Glucose-Capillary 173 (  H) 70 - 99 mg/dL  CBC     Status: Abnormal   Collection Time: 04/29/23  4:32 AM  Result Value Ref Range   WBC 3.6 (L) 4.0 - 10.5 K/uL   RBC 4.38 4.22 - 5.81 MIL/uL   Hemoglobin 13.3 13.0 - 17.0 g/dL   HCT 09.8 11.9 - 14.7 %   MCV 91.6 80.0 - 100.0 fL   MCH 30.4 26.0 - 34.0 pg   MCHC 33.2 30.0 - 36.0 g/dL   RDW 82.9 56.2 - 13.0 %   Platelets 139 (L) 150 - 400 K/uL   nRBC 0.6 (H) 0.0 - 0.2 %  Basic metabolic panel     Status: Abnormal   Collection Time: 04/29/23  4:32 AM  Result Value Ref Range   Sodium 137 135 - 145 mmol/L   Potassium 4.0 3.5 - 5.1 mmol/L   Chloride 102 98 - 111 mmol/L   CO2 25 22 - 32 mmol/L   Glucose, Bld 140 (H) 70 - 99 mg/dL   BUN 22 8 - 23 mg/dL   Creatinine, Ser 8.65 0.61 - 1.24 mg/dL    Calcium 8.1 (L) 8.9 - 10.3 mg/dL   GFR, Estimated >78 >46 mL/min   Anion gap 10 5 - 15  Magnesium     Status: None   Collection Time: 04/29/23  4:32 AM  Result Value Ref Range   Magnesium 1.8 1.7 - 2.4 mg/dL  Phosphorus     Status: None   Collection Time: 04/29/23  4:32 AM  Result Value Ref Range   Phosphorus 2.6 2.5 - 4.6 mg/dL  Glucose, capillary     Status: Abnormal   Collection Time: 04/29/23  8:04 AM  Result Value Ref Range   Glucose-Capillary 131 (H) 70 - 99 mg/dL   DG Swallowing Func-Speech Pathology Result Date: 04/28/2023 Table formatting from the original result was not included. Modified Barium Swallow Study Patient Details Name: Tyler [name] MRN: [id number] Date of Birth: 1950/12/30 Today's Date: 04/28/2023 HPI/PMH: HPI: 73 yo male presents to [facility] on 2/10 with LUE and LE weakness, and change in mentation. Head CT of brain shows a recurrent tumor at the craniotomy site and increased size of recurrent tumor along with a left midline shift up to 6 mm. 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 Pt's swallowing was evaluated when he was admitted in June of 2024 - he was D/Cd on a dysphagia 3 diet with thin liquids with no f/u recommended. Pt's swallowing assessed again on 04/08/23 this admission with recommendation of a regular diet and thin liquids. Hospital course complicated by concern for neck swelling, acute dysphagia, and concern for airway protection prompting intubation from 02/28-03/02. Neck CT on 04/26/23: demonstrated "No acute finding. Assessment of mucosal surfaces limited by airway  collapse in the setting of intubation.". Clinical Impression: Pt presents with a moderate-severe oral dysphagia and a mild pharyngeal dysphagia per results of MBSS completed today.  Oral deficits included poor bolus awareness, oral holding, reduced labial seal resulting in anterior spillage, poor lingual movement or initiation for oral transit, and reduced oral coordination resulting in posterior loss of trials. Oral deficits were more pronounced with nectar-thick liquid, puree, and soft solid trials. Soft solid trial was removed from oral cavity due to minimal-no mastication efforts.  Pt required multiple verbal and tactile cues  to eventually initiate oral transit of nectar-thick and puree trials. Tactile cues included downward pressure of spoon on tongue multiple times. Pharyngeal dysphagia characterized by reduced hyolaryngeal elevation, brief mistiming of pharyngeal swallow initiation, reduced base of tongue retraction, reduced laryngeal vestibule closure, and reduced pharyngeal stripping. Findings: -There was x1 instance of trace-min silent aspiration of thin liquids (via straw) before the swallow. Aspiration occurred posteriorly from the pyriform sinuses due to pooling of liquids before swallow was initiated. Aspiration event was completely silent without cough, water eyes, increased RR, or any other subtle s/s of aspiration. -There was intermittent, trace, shallow, transient penetration of nectar-thick and thin liquids by cup. -There was shallow penetration without ejection of mixture of thin and nectar-thick residue towards end of study. Penetrated residue appeared to move slowly towards the airway, though remained above the vocal cords during assessment. Pt unable to follow directions for strong cough to attempt to clear residue. Diet recommendations outlined below. SLP will follow up for ongoing diet tolerance assessment and therapeutic PO trials of pureed solids to address oral deficits. Recommend continue tube feeds via Cortrak for nutritional support and medication delivery. Factors that may increase risk of adverse event in presence of aspiration Rubye Oaks & Clearance Coots 2021): Factors  that may increase risk of adverse event in presence of aspiration Rubye Oaks & Clearance Coots 2021): Reduced cognitive function; Presence of tubes (ETT, trach, NG, etc.); Weak cough; Dependence for feeding and/or oral hygiene; Inadequate oral hygiene Recommendations/Plan: Swallowing Evaluation Recommendations Swallowing Evaluation Recommendations Recommendations: PO diet PO Diet Recommendation: Clear liquid diet; Thin liquids (Level 0) (ok for any thin liquids) Liquid Administration via: Cup; No straw Medication Administration: Via alternative means Supervision: Staff to assist with self-feeding; Full supervision/cueing for swallowing strategies Swallowing strategies  : Minimize environmental distractions; Small bites/sips; Check for anterior loss Postural changes: Position pt fully upright for meals; Stay upright 30-60 min after meals Oral care recommendations: Oral care QID (4x/day); Staff/trained caregiver to provide oral care; Use suctioning for oral care Caregiver Recommendations: Have oral suction available Treatment Plan Treatment Plan Treatment recommendations: Therapy as outlined in treatment plan below Follow-up recommendations: Follow physicians's recommendations for discharge plan and follow up therapies (SLP services at next level of care) Functional status assessment: Patient has had a recent decline in their functional status and demonstrates the ability to make significant improvements in function in a reasonable and predictable amount of time. Treatment frequency: Min 2x/week Treatment duration: 2 weeks Interventions: Aspiration precaution training; Compensatory techniques; Patient/family education; Trials of upgraded texture/liquids; Diet toleration management by SLP; Oropharyngeal exercises Recommendations Recommendations for follow up therapy are one component of a multi-disciplinary discharge planning process, led by the attending physician.  Recommendations may be updated based on patient status,  additional functional criteria and insurance authorization. Assessment: Orofacial Exam: Orofacial Exam Oral Cavity: Oral Hygiene: WFL Oral Cavity - Dentition: Adequate natural dentition Orofacial Anatomy: WFL Oral Motor/Sensory Function: Suspected cranial nerve impairment (limited assessment, though concern for oral apraxia) Anatomy: Anatomy: WFL Boluses Administered: Boluses Administered Boluses Administered: Mildly thick liquids (Level 2, nectar thick); Thin liquids (Level 0); Puree; Solid  Oral Impairment Domain: Oral Impairment Domain Lip Closure: Escape beyond mid-chin Tongue control during bolus hold: Posterior escape of greater than half of bolus Bolus preparation/mastication: Minimal chewing/mashing with majority of bolus unchewed (removed from oral cavity) Bolus transport/lingual motion: Minimal-no tongue motion Oral residue: Majority of bolus remaining Location of oral residue : Floor of mouth; Tongue Initiation of pharyngeal swallow : Pyriform sinuses  Pharyngeal Impairment Domain: Pharyngeal  Impairment Domain Soft palate elevation: No bolus between soft palate (SP)/pharyngeal wall (PW) Laryngeal elevation: Partial superior movement of thyroid cartilage/partial approximation of arytenoids to epiglottic petiole Anterior hyoid excursion: Complete anterior movement Epiglottic movement: Complete inversion Laryngeal vestibule closure: Incomplete, narrow column air/contrast in laryngeal vestibule Pharyngeal stripping wave : Present - diminished Pharyngeal contraction (A/P view only): N/A Pharyngoesophageal segment opening: Complete distension and complete duration, no obstruction of flow Tongue base retraction: Trace column of contrast or air between tongue base and PPW Pharyngeal residue: Collection of residue within or on pharyngeal structures Location of pharyngeal residue: Tongue base; Valleculae  Esophageal Impairment Domain: No data recorded Pill: No data recorded Penetration/Aspiration Scale Score:  Penetration/Aspiration Scale Score 1.  Material does not enter airway: Thin liquids (Level 0); Mildly thick liquids (Level 2, nectar thick); Puree 2.  Material enters airway, remains ABOVE vocal cords then ejected out: Thin liquids (Level 0); Mildly thick liquids (Level 2, nectar thick) 3.  Material enters airway, remains ABOVE vocal cords and not ejected out: Thin liquids (Level 0); Mildly thick liquids (Level 2, nectar thick) 8.  Material enters airway, passes BELOW cords without attempt by patient to eject out (silent aspiration) : Thin liquids (Level 0) Compensatory Strategies: Compensatory Strategies Compensatory strategies: Yes Straw: Ineffective Ineffective Straw: Thin liquid (Level 0) Other(comment): Ineffective (tactile pressure with spoon in effort to initiate oral transit) Ineffective Other(comment): Mildly thick liquid (Level 2, nectar thick); Moderately thick liquid (Level 3, honey thick)   General Information: Caregiver present: No (follow up education completed on phone with daughter)  Diet Prior to this Study: NPO; Cortrak/Small bore NG tube   Temperature : Febrile   Respiratory Status: WFL   Supplemental O2: Nasal cannula (2L)   History of Recent Intubation: Yes  Behavior/Cognition: Alert; Cooperative; Pleasant mood Self-Feeding Abilities: Needs set-up for self-feeding Baseline vocal quality/speech: -- (intermittent vocal wetness) Volitional Cough: Unable to elicit Volitional Swallow: Unable to elicit Exam Limitations: No limitations Goal Planning: Prognosis for improved oropharyngeal function: Fair Barriers to Reach Goals: Cognitive deficits No data recorded Patient/Family Stated Goal: drink water Consulted and agree with results and recommendations: Patient; Family member/caregiver; Nurse Pain: Pain Assessment Pain Assessment: No/denies pain Breathing: 0 Negative Vocalization: 0 Facial Expression: 0 Body Language: 1 Consolability: 0 PAINAD Score: 1 Facial Expression: 0 Body Movements: 0 Muscle  Tension: 0 Compliance with ventilator (intubated pts.): N/A Vocalization (extubated pts.): 0 CPOT Total: 0 End of Session: Start Time:SLP Start Time (ACUTE ONLY): 0900 Stop Time: SLP Stop Time (ACUTE ONLY): 0930 Time Calculation:SLP Time Calculation (min) (ACUTE ONLY): 30 min Charges: SLP Evaluations $ SLP Speech Visit: 1 Visit SLP Evaluations $BSS Swallow: 1 Procedure $MBS Swallow: 1 Procedure SLP visit diagnosis: SLP Visit Diagnosis: Dysphagia, oropharyngeal phase (R13.12) Past Medical History: Past Medical History: Diagnosis Date  Cancer (HCC)   prostate  Follicular lymphoma (HCC) 2024  History of pulmonary embolism   Hypertension   Myelodysplastic syndrome (HCC)   PAF (paroxysmal atrial fibrillation) (HCC)   Recurrent UTI  Past Surgical History: Past Surgical History: Procedure Laterality Date  APPLICATION OF CRANIAL NAVIGATION Right 03/10/2023  Procedure: APPLICATION OF CRANIAL NAVIGATION;  Surgeon: Tressie Stalker, MD;  Location: [facility] OR;  Service: Neurosurgery;  Laterality: Right;  BIOPSY  08/21/2022  Procedure: BIOPSY;  Surgeon: Beverley Fiedler, MD;  Location: [facility] ENDOSCOPY;  Service: Gastroenterology;;  CRANIOTOMY Right 03/10/2023  Procedure: PARIETAL CRANIOTOMY;  Surgeon: Tressie Stalker, MD;  Location: [facility] OR;  Service: Neurosurgery;  Laterality: Right;  ESOPHAGOGASTRODUODENOSCOPY (EGD) WITH PROPOFOL N/A  08/21/2022  Procedure: ESOPHAGOGASTRODUODENOSCOPY (EGD) WITH PROPOFOL;  Surgeon: Beverley Fiedler, MD;  Location: [facility] ENDOSCOPY;  Service: Gastroenterology;  Laterality: N/A;  IR IMAGING GUIDED PORT INSERTION  08/26/2022  LYMPHADENECTOMY Bilateral 09/01/2019  Procedure: LYMPHADENECTOMY;  Surgeon: Sebastian Ache, MD;  Location: [facility] ORS;  Service: Urology;  Laterality: Bilateral;  ROBOT ASSISTED LAPAROSCOPIC RADICAL PROSTATECTOMY N/A 09/01/2019  Procedure: XI ROBOTIC ASSISTED LAPAROSCOPIC RADICAL PROSTATECTOMY;  Surgeon: Sebastian Ache, MD;  Location: [facility] ORS;  Service: Urology;  Laterality: N/A;  3 HRS [name]  04/28/2023, 11:06 AM  Overnight EEG with video Result Date: 04/28/2023 Charlsie Quest, MD     04/29/2023 10:44 AM Patient Name: Tyler [name] MRN: [id number] Epilepsy Attending: Charlsie Quest Referring Physician/Provider: Gordy Councilman, MD Duration: 04/27/2023 1608 to 04/28/2023 1608 Patient history: 73yo M patient with 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 EEG to evaluate for seizure Level of alertness: Awake/lethargic AEDs during EEG study: LEV, LCM Technical aspects: This EEG study was done with scalp electrodes positioned according to the 10-20 International system of electrode placement. Electrical activity was reviewed with band pass filter of 1-70Hz , sensitivity of 7 uV/mm, display speed of 58mm/sec with a 60Hz  notched filter applied as appropriate. EEG data were recorded continuously and digitally stored.  Video monitoring was available and reviewed as appropriate. Description: No clear posterior dominant rhythm was seen.  EEG showed continuous generalized and maximal right parietal 3 to 6 Hz theta-delta slowing. Lateralized periodic discharges were noted in right hemisphere, maximal right parietal region at 1 Hz, at times with overlying rhythmicity. Seizures without clinical signs were noted arising from right parietal region, average 5 seizures per hour, lasting 1 to 2 minutes each.  During seizure EEG showed 5 to 6 Hz theta slowing admixed with sharp waves and right parietal region which then involved all of right hemisphere and evolved into 2 to 3 Hz delta slowing.  IV Vimpat was administered on 04/28/2023 at around 1042. Gradually after around 1300, EEG improved and showed lateralized periodic discharges in right hemisphere, maximal right parietal region at 1hz  with overlying rhythmicity.  Hyperventilation and photic stimulation were not performed.   ABNORMALITY -Seizure without clinical signs, right parietal region -Lateralized periodic discharges with overlying rhythmicity, right  hemisphere, maximal right parietal  region ( LPD+R) -Continuous slow, generalized and maximal right parietal region IMPRESSION: This study showed seizures without clinical signs arising from right parietal region, average 5 seizures per hour lasting 1 to 2 minutes each.  IV Vimpat was administered on 04/28/2023 at around 1042.  Gradually after around 1300, EEG improved and no definite seizures were noted. Additionally there is evidence of epileptogenicity and cortical dysfunction arising from right hemisphere, maximal right parietal region likely secondary to underlying structural abnormality.  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 Yes Co-Morbidities requiring supervision/potential complications:  -Encephalopathy, Seizures, HTN, Bradycardia, Hep B positive, Anemia, Thrombocytopenia Due to bladder management, bowel management, safety, skin/wound care, disease management, medication administration, pain management, and patient education, does the patient require 24 hr/day rehab nursing? Yes Does the patient require coordinated care of a physician, rehab nurse, therapy disciplines of PT/OT/SLP to address physical and functional deficits in the context of the above medical diagnosis(es)? Yes Addressing deficits in the following areas: balance, endurance,  locomotion, strength, transferring, bowel/bladder control, bathing, dressing, feeding, grooming, toileting, cognition, and psychosocial support Can the patient actively participate in an intensive therapy program of at least 3 hrs of therapy per day at least 5 days per week? Potentially The potential for patient to make measurable gains while on inpatient rehab is good Anticipated functional outcomes upon discharge from inpatient  rehab are min assist and mod assist  with PT, min assist and mod assist with OT, min assist and mod assist with SLP. Estimated rehab length of stay to reach the above functional goals is: 15-18 Anticipated discharge destination: Home Overall Rehab/Functional Prognosis: fair  POST ACUTE RECOMMENDATIONS: This patient's condition is appropriate for continued rehabilitative care in the following setting: CIR and SNF Patient has agreed to participate in recommended program. No Note that insurance prior authorization may be required for reimbursement for recommended care.  Comment: Pt continues to have seizures at this time.  Planned additional oncology treatments.  Will ask oncology regarding timing of treatments- plan for around 3/12. Not sure that he would tolerate CIR at this time but could continue to monitor for progress as his seizures improve.     I have personally performed a face to face diagnostic evaluation of this patient. Additionally, I have examined the patient's medical record including any pertinent labs and radiographic images.    Thanks,  Fanny Dance, MD 04/29/2023

## 2023-04-29 NOTE — Evaluation (Signed)
 Occupational Therapy Evaluation Patient Details Name: Tyler [name] MRN: [id number] DOB: Apr 15, 1950 Today's Date: 04/29/2023   History of Present Illness   73 yo male presents on 2/25 for second cycle of high dose methotrexate with leucovorin rescue. 2/27 MRI showed marked interval decrease in size of right parietal mass/lymphoma and New diffusion signal abnormality involving the right insular  cortex and overlying right frontal lobe, favored to reflect sequelae of seizure. 2/28 transferred to ICU and intubated. 3/2 extubated. Significant PMH: CNS relapse of large B-cell lymphoma, partial seizure, paroxysmal atrial fibrillation not on anticoagulation due to bleeding risk, prostate cancer status post prostatectomy in 2021, pulmonary embolism.     Clinical Impressions PTA, pt from home with wife and mod I. Upon eval, pt with decreased initiation, safety, LUE strength, balance, sequencing. Pt needing min-max A for ADL at time of eval. Able to SPT to [facility] with min A and +2 present for safety/lines. Pt needing multimodal cues at times for sequencing ADL and transfers. Due to significant change in functional status, recommending intensive multidisciplinary rehabilitation >3 hours/day to optimize safety and independence in ADL.       If plan is discharge home, recommend the following:   A little help with walking and/or transfers;A lot of help with bathing/dressing/bathroom;Assistance with cooking/housework;Assistance with feeding;Assist for transportation;Help with stairs or ramp for entrance;Direct supervision/assist for medications management;Direct supervision/assist for financial management     Functional Status Assessment   Patient has had a recent decline in their functional status and demonstrates the ability to make significant improvements in function in a reasonable and predictable amount of time.     Equipment Recommendations   Other (comment) (defer)     Recommendations for  Other Services   Rehab consult     Precautions/Restrictions   Precautions Precautions: Fall;Other (comment) Recall of Precautions/Restrictions: Impaired Precaution/Restrictions Comments: NGT, EEG Restrictions Weight Bearing Restrictions Per Provider Order: No     Mobility Bed Mobility Overal bed mobility: Needs Assistance Bed Mobility: Supine to Sit, Sit to Supine     Supine to sit: Mod assist, +2 for safety/equipment Sit to supine: Min assist   General bed mobility comments: Mod A for tactile cues to sequence to come to EOB and light asisst for truncal elevation. min A for repositioning once back in bed    Transfers Overall transfer level: Needs assistance Equipment used: None Transfers: Sit to/from Stand Sit to Stand: Min assist, +2 safety/equipment     Step pivot transfers: Min assist, +2 safety/equipment     General transfer comment: increaesd time to initiate STS      Balance Overall balance assessment: Needs assistance Sitting-balance support: Feet supported, No upper extremity supported Sitting balance-Leahy Scale: Good Sitting balance - Comments: statically   Standing balance support: No upper extremity supported, During functional activity Standing balance-Leahy Scale: Fair                             ADL either performed or assessed with clinical judgement   ADL Overall ADL's : Needs assistance/impaired     Grooming: Minimal assistance;Sitting   Upper Body Bathing: Sitting;Minimal assistance   Lower Body Bathing: Maximal assistance;Sit to/from stand   Upper Body Dressing : Minimal assistance;Sitting;Cueing for sequencing   Lower Body Dressing: Maximal assistance;Sit to/from stand   Toilet Transfer: Minimal assistance;+2 for safety/equipment;BSC/3in1;Stand-pivot   Toileting- Clothing Manipulation and Hygiene: Total assistance Toileting - Clothing Manipulation Details (indicate cue type and reason): tech provided pericare  Functional mobility during ADLs: Minimal assistance       Vision Baseline Vision/History: 0 No visual deficits Ability to See in Adequate Light: 0 Adequate Patient Visual Report: No change from baseline Vision Assessment?: Vision impaired- to be further tested in functional context Additional Comments: decreased visual scanning to L noted in session with better eye contact with therapist when on pt's R     Perception Perception: Impaired Preception Impairment Details: Inattention/Neglect Perception-Other Comments: left   Praxis         Pertinent Vitals/Pain Pain Assessment Pain Assessment: No/denies pain     Extremity/Trunk Assessment Upper Extremity Assessment Upper Extremity Assessment: Generalized weakness;LUE deficits/detail;Left hand dominant LUE Deficits / Details: AROM WFL, but decreased strength as compared to R   Lower Extremity Assessment Lower Extremity Assessment: Defer to PT evaluation   Cervical / Trunk Assessment Cervical / Trunk Assessment: Normal   Communication Communication Communication: Impaired Factors Affecting Communication: Reduced clarity of speech   Cognition Arousal: Alert Behavior During Therapy: Flat affect Cognition: No family/caregiver present to determine baseline             OT - Cognition Comments: Pt follows one step commands; needing intermittent increased time for processing commands, internally distracted at times. Limited verbalizations to communicate needs                 Following commands: Impaired Following commands impaired: Follows one step commands inconsistently, Follows one step commands with increased time     Cueing  General Comments   Cueing Techniques: Verbal cues;Gestural cues;Tactile cues      Exercises     Shoulder Instructions      Home Living Family/patient expects to be discharged to:: Private residence Living Arrangements: Spouse/significant other Available Help at Discharge:  Family Type of Home: Apartment Home Access: Level entry Entrance Stairs-Number of Steps: none Entrance Stairs-Rails: Right;Left Home Layout: One level     Bathroom Shower/Tub: Chief Strategy Officer: Standard Bathroom Accessibility: Yes   Home Equipment: Agricultural consultant (2 wheels);BSC/3in1;Shower seat          Prior Functioning/Environment Prior Level of Function : Independent/Modified Independent             Mobility Comments: occasional RW use ADLs Comments: Independent to Mod I with occasional assistance from children    OT Problem List: Impaired balance (sitting and/or standing);Decreased coordination;Decreased safety awareness;Impaired sensation;Impaired tone;Impaired UE functional use;Decreased cognition   OT Treatment/Interventions: Self-care/ADL training;Therapeutic activities;Visual/perceptual remediation/compensation;Patient/family education;Balance training      OT Goals(Current goals can be found in the care plan section)   Acute Rehab OT Goals Patient Stated Goal: poop OT Goal Formulation: With patient Time For Goal Achievement: 05/13/23 Potential to Achieve Goals: Good   OT Frequency:  Min 1X/week    Co-evaluation              AM-PAC OT "6 Clicks" Daily Activity     Outcome Measure Help from another person eating meals?: A Little Help from another person taking care of personal grooming?: A Little Help from another person toileting, which includes using toliet, bedpan, or urinal?: A Little Help from another person bathing (including washing, rinsing, drying)?: A Little Help from another person to put on and taking off regular upper body clothing?: A Little Help from another person to put on and taking off regular lower body clothing?: A Little 6 Click Score: 18   End of Session Equipment Utilized During Treatment: Gait belt Nurse Communication: Mobility status  Activity  Tolerance: Patient tolerated treatment well Patient  left: in bed;with call bell/phone within reach;with bed alarm set  OT Visit Diagnosis: Unsteadiness on feet (R26.81);Other abnormalities of gait and mobility (R26.89);Muscle weakness (generalized) (M62.81);Other symptoms and signs involving cognitive function                Time: 1001-1032 OT Time Calculation (min): 31 min Charges:  OT General Charges $OT Visit: 1 Visit OT Evaluation $OT Eval Moderate Complexity: 1 Mod OT Treatments $Self Care/Home Management : 8-22 mins  Tyler Deis, OTR/L [facility] Acute Rehabilitation Office: [phone]   Myrla Halsted 04/29/2023, 11:42 AM

## 2023-04-29 NOTE — Progress Notes (Signed)
  Inpatient Rehabilitation Admissions Coordinator   I await further progress with therapy and seizure management before determining rehab venue options. Please refer to Dr Sande Rives consult.  Ottie Glazier, RN, MSN Rehab Admissions Coordinator [phone] 04/29/2023 2:38 PM

## 2023-04-30 DIAGNOSIS — R509 Fever, unspecified: Secondary | ICD-10-CM | POA: Diagnosis not present

## 2023-04-30 DIAGNOSIS — C8339 Primary central nervous system lymphoma: Secondary | ICD-10-CM | POA: Diagnosis not present

## 2023-04-30 DIAGNOSIS — G9340 Encephalopathy, unspecified: Secondary | ICD-10-CM | POA: Diagnosis not present

## 2023-04-30 DIAGNOSIS — G936 Cerebral edema: Secondary | ICD-10-CM | POA: Diagnosis not present

## 2023-04-30 DIAGNOSIS — G40909 Epilepsy, unspecified, not intractable, without status epilepticus: Secondary | ICD-10-CM | POA: Diagnosis not present

## 2023-04-30 DIAGNOSIS — G40109 Localization-related (focal) (partial) symptomatic epilepsy and epileptic syndromes with simple partial seizures, not intractable, without status epilepticus: Secondary | ICD-10-CM | POA: Diagnosis not present

## 2023-04-30 LAB — CBC
HCT: 37.4 % — ABNORMAL LOW (ref 39.0–52.0)
Hemoglobin: 12.2 g/dL — ABNORMAL LOW (ref 13.0–17.0)
MCH: 29.8 pg (ref 26.0–34.0)
MCHC: 32.6 g/dL (ref 30.0–36.0)
MCV: 91.4 fL (ref 80.0–100.0)
Platelets: 132 10*3/uL — ABNORMAL LOW (ref 150–400)
RBC: 4.09 MIL/uL — ABNORMAL LOW (ref 4.22–5.81)
RDW: 13.6 % (ref 11.5–15.5)
WBC: 3.2 10*3/uL — ABNORMAL LOW (ref 4.0–10.5)
nRBC: 0 % (ref 0.0–0.2)

## 2023-04-30 LAB — BASIC METABOLIC PANEL
Anion gap: 10 (ref 5–15)
BUN: 25 mg/dL — ABNORMAL HIGH (ref 8–23)
CO2: 27 mmol/L (ref 22–32)
Calcium: 8.4 mg/dL — ABNORMAL LOW (ref 8.9–10.3)
Chloride: 100 mmol/L (ref 98–111)
Creatinine, Ser: 0.86 mg/dL (ref 0.61–1.24)
GFR, Estimated: >60 mL/min (ref >60)
Glucose, Bld: 150 mg/dL — ABNORMAL HIGH (ref 70–99)
Potassium: 4.4 mmol/L (ref 3.5–5.1)
Sodium: 137 mmol/L (ref 135–145)

## 2023-04-30 LAB — GLUCOSE, CAPILLARY
Glucose-Capillary: 137 mg/dL — ABNORMAL HIGH (ref 70–99)
Glucose-Capillary: 117 mg/dL — ABNORMAL HIGH (ref 70–99)
Glucose-Capillary: 104 mg/dL — ABNORMAL HIGH (ref 70–99)
Glucose-Capillary: 125 mg/dL — ABNORMAL HIGH (ref 70–99)

## 2023-04-30 LAB — MAGNESIUM: Magnesium: 1.9 mg/dL (ref 1.7–2.4)

## 2023-04-30 MED ORDER — OSMOLITE 1.5 CAL PO LIQD
720.0000 mL | ORAL | Status: DC
  Administered 2023-04-30 – 2023-05-01 (×2): 720 mL
  Filled 2023-04-30 (×3): qty 948

## 2023-04-30 MED ORDER — ENSURE ENLIVE PO LIQD
237.0000 mL | Freq: Two times a day (BID) | ORAL | Status: DC
  Administered 2023-04-30 – 2023-05-02 (×4): 237 mL via ORAL

## 2023-04-30 MED ORDER — FOLIC ACID 1 MG PO TABS
1.0000 mg | ORAL_TABLET | Freq: Every day | ORAL | Status: DC
  Administered 2023-04-30 – 2023-05-22 (×22): 1 mg
  Filled 2023-04-30 (×22): qty 1

## 2023-04-30 MED ORDER — INSULIN ASPART 100 UNIT/ML IJ SOLN
0.0000 [IU] | Freq: Three times a day (TID) | INTRAMUSCULAR | Status: DC
  Administered 2023-05-02 – 2023-05-03 (×2): 2 [IU] via SUBCUTANEOUS

## 2023-04-30 MED ORDER — MAGNESIUM SULFATE 2 GM/50ML IV SOLN
2.0000 g | Freq: Once | INTRAVENOUS | Status: AC
  Administered 2023-04-30: 2 g via INTRAVENOUS
  Filled 2023-04-30: qty 50

## 2023-04-30 MED ORDER — THIAMINE MONONITRATE 100 MG PO TABS
100.0000 mg | ORAL_TABLET | Freq: Every day | ORAL | Status: DC
Start: 2023-04-30 — End: 2023-05-14
  Administered 2023-04-30 – 2023-05-13 (×14): 100 mg
  Filled 2023-04-30 (×15): qty 1

## 2023-04-30 MED ORDER — PERAMPANEL 2 MG PO TABS
8.0000 mg | ORAL_TABLET | Freq: Once | ORAL | Status: AC
  Administered 2023-04-30: 8 mg
  Filled 2023-04-30: qty 4

## 2023-04-30 NOTE — Consult Note (Signed)
 NAME:  Tyler [name], MRN:  [id number], DOB:  02-18-1951, LOS: 8 ADMISSION DATE:  04/22/2023, CONSULTATION/ SERVICE  DATE:  04/25/23 REFERRING MD:  Dr. Candise Che, CHIEF COMPLAINT: Neck swelling, drooling, difficulty swallowing, altered mental status with concern for partial seizures  History of Present Illness:  Tyler [name] is a 73 year old gentleman with significant history of CNS relapse of large B-cell lymphoma, partial seizure who was admitted to cardiology service on 2/25 for second cycle of high-dose methotrexate with leucovorin rescue.  Patient also has prior history of paroxysmal atrial fibrillation not on anticoagulation due to bleeding risk, prostate cancer status post prostatectomy in 2021, pulmonary embolism, hepatitis B exposure.  Patient had high-grade large B-cell lymphoma and completed 6 cycles of R-CHOP with systemic remission in the past.  As per the primary oncologist, patient was doing well and receiving his medication as planned.  He was started on bicarb drip to minimize the methotrexate related toxicity.  However, this evening, oncologist noted increased neck swelling as well as patient had difficulty swallowing.  He was also noted to have drooling of saliva and developed altered mental status with facial twitching concerning for seizure activity.  There was concern about patient's ability to protect airway and possible angioedema (neck swelling), ED was consulted and patient was intubated emergently.  At the time of my evaluation, patient was intubated and unresponsive.  No family member at the bedside.  Rest of the information is as per the chart review and I was unable to independently verify with the patient.   PAST    has a past medical history of Cancer (HCC), Follicular lymphoma (HCC) (2024), History of pulmonary embolism, Hypertension, Myelodysplastic syndrome (HCC), PAF (paroxysmal atrial fibrillation) (HCC), and Recurrent UTI.   has a past surgical history that includes  Robot assisted laparoscopic radical prostatectomy (N/A, 09/01/2019); Lymphadenectomy (Bilateral, 09/01/2019); Esophagogastroduodenoscopy (egd) with propofol (N/A, 08/21/2022); biopsy (08/21/2022); IR IMAGING GUIDED PORT INSERTION (08/26/2022); Craniotomy (Right, 03/10/2023); and Application of cranial navigation (Right, 03/10/2023).   Significant events   04/22/2023 - ADMIT  2/27  - MRI brain - Marked interval decrease in size of right parietal mass/lymphoma, now measuring 2.9 x 3.5 x 2.3 cm (previously 5.4 x 3.6 x 4.4 cm), consistent with interval response to therapy. Persistent but improved vasogenic edema within the adjacent posterior right cerebral hemisphere, with interval resolution of previously seen right-to-left midline shift. 2. 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 Correlation with symptomatology and EEG recommended. Evolving changes of subacute ischemia would be the primary differential consideration, but are felt to be less likely.  2/28 - Transferred to ICU and intubated. Witnessed by Dr Candise Che with have left neck swelling, gurgling, and twitching left side -> then partially respnsvive -> ativan/confused -> EDP intubated. Around 7.30-8pm  04/26/23: Coretrack placed early hours .Intubated early hours.  On the ventilator FiO2 50%.  Low-grade fever since 04/17/2023 but normal white count. Ceribell EEG oongoing. Prmary language for aptient is Swahili. SBP HIGH with HR 40s - on scheduled lopressor . Ceribel shows    ceribell EEG is is suggestive of severe diffuse encephalopathy. No seizures were seen throughout the recording.   - Blood clture  - Tracheal  aspirate  3/2 Extubated. Remains on PANDA. Scheduled for barium today. Remains on cEEG per Neuro   SUBJECTIVE/OVERNIGHT/INTERVAL HX   Continues to have intermittent fevers likely secondary to seizure AEDs increased per  Neuro  Objective   Blood pressure  134/81, pulse 82, temperature 99.7 F (37.6 C), temperature source Oral, resp. rate 20, height 5\' 7"  (1.702 m), weight 90.1 kg, SpO2 96%.        Intake/Output Summary (Last 24 hours) at 04/30/2023 0902 Last data filed at 04/30/2023 0800 Gross per 24 hour  Intake 1335.03 ml  Output 900 ml  Net 435.03 ml   Filed Weights   04/28/23 0500 04/29/23 0450 04/30/23 0500  Weight: 89.2 kg 89.1 kg 90.1 kg   Physical Exam: General: Well-appearing, no acute distress HENT: [place], AT Eyes: EOMI, no scleral icterus Respiratory: Clear to auscultation bilaterally.  No crackles, wheezing or rales Cardiovascular: RRR, -M/R/G, no JVD Extremities:-Edema,-tenderness Neuro: AAO x2, left facial twitching CNII-XII grossly intact Psych: Normal mood, normal affect  Imaging, labs and test noted above have been reviewed independently by me.  WBC 3.2 BMET acceptable Mg 1.9   Assessment & Plan:   Partial sensory seizures, left side Acute encephalopathy  -on 04/25/2203 felt due to CNS lymphoma with edema -04/25/2023: Morning reports of relatively normal mental status but at night sudden worsening with obtundation and potential seizures and intubated. MRI Mass improvd but 2/27 - New diffusion signal abnormality involving the right insular cortex and overlying right frontal lobe, favored to reflect sequelae of seizure -3/2 - doing wake up exam without seizures P:  Neurology following. Managing AEDs. 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 S/p chemotherapy R-CHOP.  S/p craniotomy with gross total resection 03/10/23.  - CT of head 04/07/2023 showed interval increase in size of tumor at the craniotomy site. MRI of head 04/07/2023 increase in size of tumor with slightly increased surrounding edema. MRI brain 2/27  - improvmeent - Currently  on Hi-dose methotrexate chemotherapy regimen which is more aggressive approach.  Received cycle 1 on 04/09/23 and admitted for cycle 2 of HIGH DOSE Methotrexate chemotherapy regimen,  completed 04/23/2023. P: Oncology following  - Now methotrexate level < 0.1. Off sodium bicarb, off leucovorin. No longer monitoring urine PH --PO dexamethasone for brain edema - IV Rituxan as outpatient between 2nd and 3rd cycle of methotrexate and subsequently between treatment cycles. Plan for first week of March 2025 if discharged - can be delayed if needed Restart home multivitamins including b complex and folic acid  Acute respiratory failure 04/25/2023 evening associated with neck swelling  (likely fat tissue chages with seizures) and seizures and  postictal state  - s/p Intubation .   - Had neck swelling at time bu CT neck is normal and exam 3/1 - is fat tissue P:   Extubated 3/2 Passed barium however impulsive/choking. Tolerating full liquid diet Wean supplemental O2 for goal >90% Aspiration precautions GOC discussed with daughter regarding reintubation in setting of active seizures and recurrent lymphoma  Hypertensive and Bradycardic on lopressor 04/26/23 Normal echo 04/26/23 P:  DC lopressor due to bradycardia 3/2 Start scheduled hydralazine 3/3 Tele  Hep B antibody positive, chronic P: On tenofovir for hep B reactivation suppression in context of Rituxan use. Plan is to continue for 6 months after his last chemotherapy. Follow ID outpatient  Intermittent fever  Likely secondary to seizure. No source of infection. RVP neg 3/2- current P: Monitor fever curve/WBC off antibiotics  Mild anemia of critical illness onset less than 13 g% 04/26/2023 P:  Trend Transfuse if needed for Hg goal >7  Mild thrombocytopenia - mild P: Lovenox 40  mg every 24 hours    At risk for hypo and hyperglycemia P:   SSI Tube feeds   Best practice:  Diet: Full liquid diet Pain/Anxiety/Delirium protocol (if  indicated): n/a VAP protocol (if indicated): n/a DVT prophylaxis: lovnox GI prophylaxis: Home PPI Glucose control: ssi Mobility: bed rest Code Status: full Family Communication:   3/1 - Daughter Sherron Flemings - > [phone] => updated her over phone. Also d/w Dr Candise Che  3/2 - Daughter Sherron Flemings -> upated over phone.  Explained need to go to [facility] 04/27/23 for c-EEG  3/3 - Updated daughter at bedside  3/4 Updated daughter by phone   ATTESTATION & SIGNATURE   The patient is critically ill with multiple organ systems failure and requires high complexity decision making for assessment and support, frequent evaluation and titration of therapies, application of advanced monitoring technologies and extensive interpretation of multiple databases.  Independent Critical Care Time: 35 Minutes.   Mechele Collin, M.D. [facility] Pulmonary/Critical Care Medicine 04/30/2023 9:02 AM   Please see Amion for pager number to reach on-call Pulmonary and Critical Care Team.

## 2023-04-30 NOTE — Procedures (Addendum)
 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 EEG to evaluate for seizure    Level of alertness: Awake/lethargic, asleep   AEDs during EEG study: LEV, LCM, Perampanel   Technical aspects: This EEG study was done with scalp electrodes positioned according to the 10-20 International system of electrode placement. Electrical activity was reviewed with band pass filter of 1-70Hz , sensitivity of 7 uV/mm, display speed of 42mm/sec with a 60Hz  notched filter applied as appropriate. EEG data were recorded continuously and digitally stored.  Video monitoring was available and reviewed as appropriate.   Description: No clear posterior dominant rhythm was seen.  Sleep was characterized by sleep spindles (12 to 40 Hz), maximal frontocentral region.  EEG showed continuous generalized and maximal right parietal 3 to 6 Hz theta-delta slowing. Lateralized periodic discharges were noted in right hemisphere, maximal right parietal region with fluctuating frequency of 1 to 2.5 Hz , at times with overlying rhythmicity. Hyperventilation and photic stimulation were not performed.      Patient was noted to have episodes of left-sided facial twitching.  Concomitant EEG showed lateralized periodic discharges with overlying rhythmicity in right hemisphere, maximal right parietal region which evolved in frequency from 5 Hz to 2 to 3 Hz and appeared more sharply contoured.  Average 6 seizures were noted per hour lasting about 1 minute each.   ABNORMALITY -Focal seizure, right parietal region -Lateralized periodic discharges with overlying rhythmicity, right hemisphere, maximal right parietal  region ( LPD+R) -Continuous slow, generalized and maximal right parietal region   IMPRESSION: This study showed  focal motor seizures during which patient had left facial twitching arising from right parietal region.  Average 6 seizures were noted per hour, lasting about 1 minute each.  Additionally there is evidence of epileptogenicity arising from right hemisphere, maximal right parietal region which is on the ictal-interictal continuum with higher likelihood of ictal nature due to the overlying rhythmicity and frequency reaching up to 2.5 Hz.     Lastly there is cortical dysfunction in right hemisphere, maximal right parietal region likely secondary to underlying structural abnormality as well as moderate diffuse encephalopathy.     [name] [name]

## 2023-04-30 NOTE — Progress Notes (Signed)
 [facility] ADULT ICU REPLACEMENT PROTOCOL   The patient does apply for the [facility] Adult ICU Electrolyte Replacment Protocol based on the criteria listed below:   1.Exclusion criteria: TCTS, ECMO, Dialysis, and Myasthenia Gravis patients 2. Is GFR >/= 30 ml/min? Yes.    Patient's GFR today is >60 3. Is SCr </= 2? Yes.   Patient's SCr is 0.86 mg/dL 4. Did SCr increase >/= 0.5 in 24 hours? No. 5.Pt's weight >40kg  Yes.   6. Abnormal electrolyte(s): mag 1.9  7. Electrolytes replaced per protocol 8.  Call MD STAT for K+ </= 2.5, Phos </= 1, or Mag </= 1 Physician:  protocol  Melvern Banker 04/30/2023 5:47 AM

## 2023-04-30 NOTE — Progress Notes (Signed)
 Nutrition Follow-up  DOCUMENTATION CODES:  Not applicable  INTERVENTION:  Modify tube feeding via Cortrak to nocturnal feedings: Osmolite 1.5 at 60 ml/h from 6PM-6AM ( ) Prosource TF20 60 ml BID   Provides 1240 kcal (60% kcal needs), 85 gm protein (74% protein needs), 548 ml free water daily  Continue Ensure Enlive po BID, each supplement provides 350 kcal and 20 grams of protein.  Continue Magic cup TID with meals, each supplement provides 290 kcal and 9 grams of protein     NUTRITION DIAGNOSIS:  Inadequate oral intake related to inability to eat as evidenced by NPO status. - no longer NPO and started on DYS1, thin liquid diet today  GOAL:  Patient will meet greater than or equal to 90% of their needs - progressing  MONITOR:  Vent status, Labs, Weight trends, TF tolerance  REASON FOR ASSESSMENT:  Consult Enteral/tube feeding initiation and management (trickles)  ASSESSMENT:  73 y.o. male with PMH prostate cancer s/p prostatectomy in 2021, pulmonary embolism, hepatitis B exposure, and high-grade large B-cell lymphoma and completed 6 cycles of R-CHOP (chemo) with systemic remission in the past. 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  2/25 - admitted  2/28 - intubated 3/1 - s/p cortrak placement (at [facility]); tip gastric per xray 3/2 - extubated 3/3 - MBSS: Clear liquid diet (thins) 3/4 - Full Liquid Diet 3/5 - DYS 1, thin liquid diet, nocturnal feeds  Pt diet advanced to dysphagia 1, thin liquids today. Per speech, he continues to exhibit impulsive eating behaviors, which precipitate coughing with large sips of thin liquids. Will modify feedings to nocturnal (6P-6A) to maximize intake during the day. Noted with consumption of 75% x2 meals today even with tube feed running continuously. Speech recommending continue feeding assistance to prevent impulsive eating and drinking.    Admit Weight: 84.8kg Current Weight: 90.1kg Lowest Weight: 83.5kg on 3/1  No significant edema noted on exam today.  UOP recorded yesterday. Urinary catheter remains in place. Abdomen with some distention. Bowels moving, with last one documented yesterday.   Meds: cholecalciferol, cyanocobalamin, folic acid, SSI, pantoprazole, thiamine  Labs: reviewed and unremarkable   Diet Order:   Diet Order             DIET - DYS 1 Room service appropriate? Yes; Fluid consistency: Thin  Diet effective now             EDUCATION NEEDS:   Not appropriate for education at this time  Skin:  Skin Assessment: Reviewed RN Assessment  Last BM:  3/4  Height:  Ht Readings from Last 1 Encounters:  04/23/23 5\' 7"  (1.702 m)   Weight:  Wt Readings from Last 1 Encounters:  04/30/23 90.1 kg   Ideal Body Weight:  67.27 kg  BMI:  Body mass index is 31.11 kg/m.  Estimated Nutritional Needs:   Kcal:  1610-9604 kcals  Protein:  115-125g  Fluid:  >/= 2.1L  Myrtie Cruise MS, RD, LDN Registered Dietitian Clinical Nutrition RD Inpatient Contact Info in Amion

## 2023-04-30 NOTE — Progress Notes (Signed)
 Speech Language Pathology Treatment: Dysphagia;Cognitive-Linquistic  Patient Details Name: Tyler [name] Severe MRN: [id number] DOB: May 19, 1950 Today's Date: 04/30/2023 Time: 7829-5621 SLP Time Calculation (min) (ACUTE ONLY): 31 min  Assessment / Plan / Recommendation Clinical Impression  Pt seen for skilled SLP session to address speech and swallowing goals. Per discussion with RN, pt continues to exhibit impulsive eating behaviors and has some coughing with large sips of thin liquids.   Speech: Overall, some improvement in conversational speech intelligibility today. Pt continues to have periods of unintelligible speech, though communication breakdowns are more easily repaired repetition and reproduction of verbalizations at the 1-2 word level.  Pt able to produce single vowels with fair accuracy and no evidence of oral groping. Continue to reinforce clear speech strategies including slow rate, over-articulation, and communicating in short phrases as able.   Swallowing: SLP prompted trials of thin liquids (6-7 oz), nectar-thick liquids (4 oz), puree solids (8 oz), and softened cracker x3 bites.   Oral prep and transit observed to be more timely today with only occasional instances of oral holding or prolonged oral transit of <30 seconds. Pt continues to exhibit anterior spillage from L side of mouth of liquids and soft solids. Some of anterior spillage is related to impulsive drinking of liquids, despite SLP verbal re-direction. It is still best to give pt small amount of liquids at a time (2-3 oz) given his inability to take single sips. Oral prep and transit of soft soli observed to be disorganized and incomplete with min-moderate pocketing of residue observed on L side. Min anterior spillage of cracker observed from L side.   Significant coughing events and watery eyes noted on x2 occasions and both appeared related to impulsivity. Initial coughing episode immediately followed large, consecutive swallows  of nectar-thick liquids by cup. Second instance observed following rapid, consecutive bites of pudding when pt attempted to feed self. No overt s/s of aspiration observed apart from occasional throat clearing when SLP fed pt bites of puree solid at a slow rate or when liquids were provided in small amounts. Pt's aspiration risk appears related to amount of intake and impulsivity as opposed to consistency (I.e. thin vs nectar-thick liquids.). Per chart review, pt is on room air and WBC is not elevated. He has been running intermittent low grade fevers, though MD note attributes this to seizure activity vs concern for infection.   Recommend diet advancement to puree solids. Continue thin liquids by cup. PO meds via DhT. Continue strict aspiration precautions including feeding assistance to prevent impulsive eating and drinking. Pt okay to drink from cup independently, though recommend pouring 2-3 oz of liquid in cup at a time. Continue SLP PoC.      HPI HPI: 73 yo male presents to [facility] on 2/10 with LUE and LE weakness, and change in mentation. Head CT of brain shows a recurrent tumor at the craniotomy site and increased size of recurrent tumor along with a left midline shift up to 6 mm. 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 Pt's swallowing was evaluated when he was admitted in June of 2024 - he was D/Cd on a dysphagia 3 diet with thin liquids with no f/u recommended. Pt's swallowing assessed again on 04/08/23 this admission with recommendation of a regular diet and thin liquids. Hospital course complicated by concern for neck swelling,  acute dysphagia, and concern for airway protection prompting intubation from 02/28-03/02. Neck CT on 04/26/23: demonstrated "No acute finding. Assessment  of mucosal surfaces limited by airway  collapse in the setting of intubation.".Per Neurology, pt continues to have ongoing seizures. He continues to be on EEG.      SLP Plan  Continue with current plan of care      Recommendations for follow up therapy are one component of a multi-disciplinary discharge planning process, led by the attending physician.  Recommendations may be updated based on patient status, additional functional criteria and insurance authorization.    Recommendations  Diet recommendations: Dysphagia 1 (puree);Thin liquid Liquids provided via: Cup Medication Administration: Via alternative means Supervision: Staff to assist with self feeding;Full supervision/cueing for compensatory strategies Compensations: Slow rate;Small sips/bites;Monitor for anterior loss;Minimize environmental distractions Postural Changes and/or Swallow Maneuvers: Upright 30-60 min after meal;Seated upright 90 degrees                  Oral care BID   Frequent or constant Supervision/Assistance Dysphagia, oropharyngeal phase (R13.12);Apraxia (R48.2);Aphasia (R47.01);Dysarthria and anarthria (R47.1);Attention and concentration deficit     Continue with current plan of care     Tyler [name]  04/30/2023, 11:13 AM

## 2023-04-30 NOTE — Progress Notes (Signed)
 Physical Therapy Treatment Patient Details Name: Tyler [name] MRN: [id number] DOB: [birth year] Today's Date: 04/30/2023   History of Present Illness 73 yo male presents on 2/25 for second cycle of high dose methotrexate with leucovorin rescue. 2/27 MRI showed marked interval decrease in size of right parietal mass/lymphoma and New diffusion signal abnormality involving the right insular  cortex and overlying right frontal lobe, favored to reflect sequelae of seizure. 2/28 transferred to ICU and intubated. 3/2 extubated. Significant PMH: CNS relapse of large B-cell lymphoma, partial seizure, paroxysmal atrial fibrillation not on anticoagulation due to bleeding risk, prostate cancer status post prostatectomy in 2021, pulmonary embolism.    PT Comments  Pt progressing well towards his physical therapy goals. MD cleared RN to disconnect EEG for mobilization. Pt appears eager to participate. Pt ambulating room distances with no AD at a min assist level (+2 safety due to impulsivity and line management). Pt assisted with toileting and washing hands at sink. Demonstrates dynamic instability, impaired cognition/communication, and left sided strength. Patient will benefit from intensive inpatient follow-up therapy, >3 hours/day in order to address deficits and maximize functional mobility.    If plan is discharge home, recommend the following: A lot of help with walking and/or transfers;A lot of help with bathing/dressing/bathroom   Can travel by private vehicle     No  Equipment Recommendations  Other (comment) (TBA)    Recommendations for Other Services       Precautions / Restrictions Precautions Precautions: Fall;Other (comment) (seizure) Recall of Precautions/Restrictions: Impaired Precaution/Restrictions Comments: NGT, EEG Restrictions Weight Bearing Restrictions Per Provider Order: No     Mobility  Bed Mobility Overal bed mobility: Needs Assistance Bed Mobility: Supine to Sit, Sit to  Supine     Supine to sit: Contact guard Sit to supine: Contact guard assist   General bed mobility comments: CGA for safety and assist with line management    Transfers Overall transfer level: Needs assistance Equipment used: None Transfers: Sit to/from Stand Sit to Stand: Min assist           General transfer comment: Assist to rise and steady from edge of bed and toilet    Ambulation/Gait Ambulation/Gait assistance: Min assist, +2 safety/equipment Gait Distance (Feet): 20 Feet Assistive device: None Gait Pattern/deviations: Step-through pattern, Decreased stride length, Shuffle Gait velocity: decreased Gait velocity interpretation: <1.31 ft/sec, indicative of household ambulator   General Gait Details: Shuffling gait with decreased bilateral foot clearance, minA for dynamic balance, verbal cues for navigation   Stairs             Wheelchair Mobility     Tilt Bed    Modified Rankin (Stroke Patients Only)       Balance Overall balance assessment: Needs assistance Sitting-balance support: Feet supported, No upper extremity supported Sitting balance-Leahy Scale: Good     Standing balance support: No upper extremity supported, During functional activity Standing balance-Leahy Scale: Poor Standing balance comment: External support from therapist                            Communication Communication Communication: Impaired  Cognition Arousal: Alert Behavior During Therapy: Flat affect, Impulsive   PT - Cognitive impairments: Difficult to assess Difficult to assess due to: Impaired communication                     PT - Cognition Comments: Decreased verbalizations this session, pt difficult to redirect at points and impulsive, able  to gesture to make some of his needs known. Seems eager to mobilize Following commands: Impaired Following commands impaired: Follows one step commands inconsistently, Follows one step commands with  increased time    Cueing Cueing Techniques: Verbal cues, Gestural cues, Tactile cues  Exercises      General Comments        Pertinent Vitals/Pain Pain Assessment Pain Assessment: Faces Faces Pain Scale: No hurt    Home Living                          Prior Function            PT Goals (current goals can now be found in the care plan section) Acute Rehab PT Goals Patient Stated Goal: none stated Potential to Achieve Goals: Fair Progress towards PT goals: Progressing toward goals    Frequency    Min 3X/week      PT Plan Current plan remains appropriate    Co-evaluation              AM-PAC PT "6 Clicks" Mobility   Outcome Measure  Help needed turning from your back to your side while in a flat bed without using bedrails?: A Little Help needed moving from lying on your back to sitting on the side of a flat bed without using bedrails?: A Little Help needed moving to and from a bed to a chair (including a wheelchair)?: A Little Help needed standing up from a chair using your arms (e.g., wheelchair or bedside chair)?: A Little Help needed to walk in hospital room?: A Little Help needed climbing 3-5 steps with a railing? : Total 6 Click Score: 16    End of Session Equipment Utilized During Treatment: Gait belt Activity Tolerance: Patient tolerated treatment well Patient left: in bed;with call bell/phone within reach;with bed alarm set Nurse Communication: Mobility status PT Visit Diagnosis: Unsteadiness on feet (R26.81);Other abnormalities of gait and mobility (R26.89);Other symptoms and signs involving the nervous system (R29.898)     Time: 1610-9604 PT Time Calculation (min) (ACUTE ONLY): 30 min  Charges:    $Therapeutic Activity: 23-37 mins PT General Charges $$ ACUTE PT VISIT: 1 Visit                     Lillia Pauls, PT, DPT Acute Rehabilitation Services Office [phone]    Norval Morton 04/30/2023, 2:57 PM

## 2023-05-01 ENCOUNTER — Inpatient Hospital Stay (HOSPITAL_COMMUNITY)

## 2023-05-01 ENCOUNTER — Encounter (HOSPITAL_COMMUNITY)

## 2023-05-01 DIAGNOSIS — G936 Cerebral edema: Secondary | ICD-10-CM | POA: Diagnosis not present

## 2023-05-01 DIAGNOSIS — G40909 Epilepsy, unspecified, not intractable, without status epilepticus: Secondary | ICD-10-CM | POA: Diagnosis not present

## 2023-05-01 DIAGNOSIS — G9340 Encephalopathy, unspecified: Secondary | ICD-10-CM | POA: Diagnosis not present

## 2023-05-01 DIAGNOSIS — C8339 Primary central nervous system lymphoma: Secondary | ICD-10-CM | POA: Diagnosis not present

## 2023-05-01 DIAGNOSIS — R509 Fever, unspecified: Secondary | ICD-10-CM | POA: Diagnosis not present

## 2023-05-01 DIAGNOSIS — J9600 Acute respiratory failure, unspecified whether with hypoxia or hypercapnia: Secondary | ICD-10-CM | POA: Diagnosis not present

## 2023-05-01 DIAGNOSIS — G40109 Localization-related (focal) (partial) symptomatic epilepsy and epileptic syndromes with simple partial seizures, not intractable, without status epilepticus: Secondary | ICD-10-CM | POA: Diagnosis not present

## 2023-05-01 LAB — CBC
HCT: 35.3 % — ABNORMAL LOW (ref 39.0–52.0)
Hemoglobin: 11.5 g/dL — ABNORMAL LOW (ref 13.0–17.0)
MCH: 29.9 pg (ref 26.0–34.0)
MCHC: 32.6 g/dL (ref 30.0–36.0)
MCV: 91.9 fL (ref 80.0–100.0)
Platelets: 129 10*3/uL — ABNORMAL LOW (ref 150–400)
RBC: 3.84 MIL/uL — ABNORMAL LOW (ref 4.22–5.81)
RDW: 14.1 % (ref 11.5–15.5)
WBC: 4 10*3/uL (ref 4.0–10.5)
nRBC: 0 % (ref 0.0–0.2)

## 2023-05-01 LAB — GLUCOSE, CAPILLARY
Glucose-Capillary: 89 mg/dL (ref 70–99)
Glucose-Capillary: 108 mg/dL — ABNORMAL HIGH (ref 70–99)
Glucose-Capillary: 107 mg/dL — ABNORMAL HIGH (ref 70–99)
Glucose-Capillary: 120 mg/dL — ABNORMAL HIGH (ref 70–99)

## 2023-05-01 LAB — CULTURE, BLOOD (ROUTINE X 2)

## 2023-05-01 LAB — BASIC METABOLIC PANEL
Anion gap: 6 (ref 5–15)
BUN: 30 mg/dL — ABNORMAL HIGH (ref 8–23)
CO2: 26 mmol/L (ref 22–32)
Calcium: 7.7 mg/dL — ABNORMAL LOW (ref 8.9–10.3)
Chloride: 104 mmol/L (ref 98–111)
Creatinine, Ser: 0.98 mg/dL (ref 0.61–1.24)
GFR, Estimated: >60 mL/min (ref >60)
Glucose, Bld: 128 mg/dL — ABNORMAL HIGH (ref 70–99)
Potassium: 4 mmol/L (ref 3.5–5.1)
Sodium: 136 mmol/L (ref 135–145)

## 2023-05-01 LAB — MAGNESIUM: Magnesium: 1.9 mg/dL (ref 1.7–2.4)

## 2023-05-01 MED ORDER — LACOSAMIDE 50 MG PO TABS
150.0000 mg | ORAL_TABLET | Freq: Two times a day (BID) | ORAL | Status: DC
  Administered 2023-05-01 – 2023-05-02 (×3): 150 mg
  Filled 2023-05-01 (×3): qty 3

## 2023-05-01 MED ORDER — SODIUM CHLORIDE 0.9 % IV SOLN
260.0000 mg | INTRAVENOUS | Status: AC
  Administered 2023-05-01: 260 mg via INTRAVENOUS
  Filled 2023-05-01: qty 2

## 2023-05-01 MED ORDER — MAGNESIUM SULFATE 2 GM/50ML IV SOLN
2.0000 g | Freq: Once | INTRAVENOUS | Status: AC
  Administered 2023-05-01: 2 g via INTRAVENOUS
  Filled 2023-05-01: qty 50

## 2023-05-01 MED ORDER — PERAMPANEL 2 MG PO TABS
2.0000 mg | ORAL_TABLET | Freq: Every day | ORAL | Status: DC
  Administered 2023-05-01: 2 mg
  Filled 2023-05-01: qty 1

## 2023-05-01 NOTE — Progress Notes (Signed)
 Alerted Dr. Melynda Ripple that patient's seizures seemed to become more robust.  Patient was still able to follow commands but facial twitching has become more exaggerated then previous seizure like activity.  Orders were given for phenobarbital.  Will administer medication and continue to assess and act accordingly.

## 2023-05-01 NOTE — Procedures (Addendum)
 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 EEG to evaluate for seizure    Level of alertness: Awake, asleep   AEDs during EEG study: LEV, LCM, Perampanel, Phenobarb   Technical aspects: This EEG study was done with scalp electrodes positioned according to the 10-20 International system of electrode placement. Electrical activity was reviewed with band pass filter of 1-70Hz , sensitivity of 7 uV/mm, display speed of 44mm/sec with a 60Hz  notched filter applied as appropriate. EEG data were recorded continuously and digitally stored.  Video monitoring was available and reviewed as appropriate.   Description: No clear posterior dominant rhythm was seen.  Sleep was characterized by sleep spindles (12 to 40 Hz), maximal frontocentral region.  EEG showed continuous generalized and maximal right parietal 3 to 6 Hz theta-delta slowing. Lateralized periodic discharges were noted in right hemisphere, maximal right parietal region with fluctuating frequency of 1 to 2.5 Hz , at times with overlying rhythmicity. Hyperventilation and photic stimulation were not performed.      Patient was noted to have episodes of left-sided facial twitching.  Concomitant EEG showed lateralized periodic discharges with overlying rhythmicity in right hemisphere, maximal right parietal region which evolved in frequency from 5 Hz to 2 to 3 Hz and appeared more sharply contoured.  Average 6 seizures were noted per hour lasting about 1 minute each.   ABNORMALITY -Focal seizure, right parietal region -Lateralized periodic discharges with overlying rhythmicity, right hemisphere, maximal right parietal  region ( LPD+R) -Continuous slow, generalized and maximal right parietal region   IMPRESSION: This study  showed focal motor seizures during which patient had left facial twitching arising from right parietal region.  Average 6 seizures were noted per hour, lasting about 1 minute each.   Additionally there is evidence of epileptogenicity arising from right hemisphere, maximal right parietal region which is on the ictal-interictal continuum with higher likelihood of ictal nature due to the overlying rhythmicity and frequency reaching up to 2.5 Hz.     Lastly there is cortical dysfunction in right hemisphere, maximal right parietal region likely secondary to underlying structural abnormality as well as moderate diffuse encephalopathy.     Tamsen Reist [name]

## 2023-05-01 NOTE — Progress Notes (Signed)
 [place]   DOB:12/05/1950   ZO#:[id number]      ASSESSMENT & PLAN:  1.  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  Had improved significantly to baseline last week.   - On 04/25/2023 while Dr. Candise Che while rounding in the evening, observed patient with likely partial seizure.  Rapid response was called and appropriate medications administered and patient intubated. - Transferred from [facility] to [facility].  Continues on video EEG monitoring for intermittent seizures.  On seizure precautions. - On Keppra 1500 mg po bid, Vimpat 50 mg IV twice daily, and steroids.   - MRI brain 04/24/2023 in the AM showed marked interval decrease in right parietal mass persistent with response to therapy.  However new diffusion signal abnormality involving right insular cortex and overlying right frontal favored to reflect sequelae of seizure. - If seizure control improves with improvement of his tumors, can restart additional 2 HD Methotrexate treatments.   - If his functional status does not allow additional HD MTX, we would consult rad onc to control the tumor with RT.   - Family meeting held with daughter Karena Addison, her husband and patient's wife.  Neurology explained that seizure meds can be slowly titrated up and risk and benefits explained.  Family is opting to continue with seizure control as outlined by Neuro Dr. Melynda Ripple and continue with next cycle of chemotherapy as available.   - Critical care and neuro teams following - Medical Oncology/Dr. Candise Che following closely.   2.  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   S/p chemotherapy R-CHOP.  S/p craniotomy with resection 03/10/23.  - CT head 04/07/2023 showed interval increase in size of tumor at the craniotomy site.  MRI head 04/07/2023 increase in size of tumor with slightly increased surrounding edema. - S/P  C2 Hi-dose methotrexate chemotherapy regimen. Completed 04/23/2023. S/P daily monitoring of Urine pH and methotrexate levels. S/P IV leucovorin rescue - On hold: Outpatient IV Rituxan between 2nd and 3rd cycle of methotrexate and subsequently between treatment cycles until acute phase resolves. - Consideration for brain radiation for local control.  - Continue daily labs - Medical oncology/Dr. Candise Che following closely.     3.  Pancytopenia: Thrombocytopenia, Anemia, Leukopenia - Mild, likely due to recent chemotherapy treatment - platelets 129 k today - Hemoglobin 11.5, stable - WBC 4.0 today, stable - No intervention required at this time - Monitor CBC with differential    4.  Elevated blood glucose  - Monitor blood sugar levels closely.  Stable 89 today. -Primary team following    5.  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  A-fib - S/p Eliquis, d/c'd due to high risk for bleeding  - Follow closely with cardiology   7.  Prostate cancer - Diagnosed 2021   8.  Hep B antibody positive, chronic - On tenofovir for hep B reactivation suppression in context of Rituxan use, continue via G-tube. - Plan is to continue for 6 months after his last chemotherapy. - Follow ID outpatient   9.  VTE prophylaxis - With Lovenox   10.  Fever - Temperature 98.6-100.9 today - May be related to seizure, meds, infection. - Management per critical care team - Monitor fever curve    Code Status Full  Subjective:  Patient seen awake and alert laying  in bed.  Patient is verbally responsive, noted slow response.  Also observed seizure, patient reports that he feels "cold" when he has the seizure.  No complaints of pain or new complaints.  No acute distress is noted.   Objective:  Vitals:   05/01/23 0700 05/01/23 0800  BP: 134/77 (!) 166/94  Pulse: 90 93  Resp: 20 (!) 28  Temp:  (!) 100.8 F (38.2 C)  SpO2: 92% 92%     Intake/Output  Summary (Last 24 hours) at 05/01/2023 0915 Last data filed at 05/01/2023 0800 Gross per 24 hour  Intake 1542.9 ml  Output 1850 ml  Net -307.1 ml     REVIEW OF SYSTEMS:   Constitutional: +seizure activity Eyes: Denies blurriness of vision, double vision or watery eyes Ears, nose, mouth, throat, and face: Denies mucositis or sore throat Respiratory: Denies cough, dyspnea or wheezes Cardiovascular: Denies palpitation, chest discomfort or lower extremity swelling Gastrointestinal:  Denies nausea, heartburn or change in bowel habits Skin: Denies abnormal skin rashes Lymphatics: Denies new lymphadenopathy or easy bruising Neurological: Denies numbness, tingling or new weaknesses Behavioral/Psych: Mood is stable, no new changes  All other systems were reviewed with the patient and are negative.  PHYSICAL EXAMINATION: ECOG PERFORMANCE STATUS: 3 - Symptomatic, >50% confined to bed  Vitals:   05/01/23 0700 05/01/23 0800  BP: 134/77 (!) 166/94  Pulse: 90 93  Resp: 20 (!) 28  Temp:  (!) 100.8 F (38.2 C)  SpO2: 92% 92%   Filed Weights   04/28/23 0500 04/29/23 0450 04/30/23 0500  Weight: 196 lb 10.4 oz (89.2 kg) 196 lb 6.9 oz (89.1 kg) 198 lb 10.2 oz (90.1 kg)    GENERAL: alert, +EEG monitoring ongoing SKIN: skin color, texture, turgor are normal, no rashes or significant lesions EYES: normal, conjunctiva are pink and non-injected, sclera clear OROPHARYNX: no exudate, no erythema and lips, buccal mucosa, and tongue normal  NECK: supple, thyroid normal size, non-tender, without nodularity LYMPH: no palpable lymphadenopathy in the cervical, axillary or inguinal LUNGS: clear to auscultation and percussion with normal breathing effort HEART: regular rate & rhythm and no murmurs and no lower extremity edema ABDOMEN: abdomen soft, non-tender and normal bowel sounds MUSCULOSKELETAL: no cyanosis of digits and no clubbing  PSYCH: alert & oriented x +slow speech NEURO: no focal motor/sensory  deficits   All questions were answered. The patient knows to call the clinic with any problems, questions or concerns.   The total time spent in the appointment was 60 minutes encounter with patient including review of chart and various tests results, discussions about plan of care and coordination of care plan  Dawson Bills, NP 05/01/2023 9:15 AM    Labs Reviewed:  Lab Results  Component Value Date   WBC 4.0 05/01/2023   HGB 11.5 (L) 05/01/2023   HCT 35.3 (L) 05/01/2023   MCV 91.9 05/01/2023   PLT 129 (L) 05/01/2023   Recent Labs    08/11/22 0316 08/11/22 1544 08/12/22 0515 08/12/22 1400 08/20/22 0302 08/21/22 0334 04/26/23 0500 04/27/23 0432 04/28/23 1120 04/29/23 0432 04/30/23 0449 05/01/23 0559  NA 134*   < > 137   < > 137   < > 138 140 138 137 137 136  K 3.7   < > 3.3*   < > 4.0   < > 3.3* 3.6 4.2 4.0 4.4 4.0  CL 99   < > 101   < > 102   < > 97* 104 100 102 100 104  CO2 25   < > 28   < > 24   < > 30 28 29 25 27 26   GLUCOSE 185*   < > 114*   < > 78   < > 149* 112* 105* 140* 150* 128*  BUN 52*   < > 41*   < > 45*   < > 21 26* 18 22 25* 30*  CREATININE 1.58*   < > 1.44*   < > 3.38*   < > 1.08 0.93 1.07 0.88 0.86 0.98  CALCIUM 7.4*   < > 7.5*   < > 7.2*   < > 8.0* 8.0* 8.2* 8.1* 8.4* 7.7*  GFRNONAA 47*   < > 52*   < > 19*   < > >60 >60 >60 >60 >60 >60  PROT 4.6*  --  4.2*  --  3.9*   < > 5.2* 4.9* 5.1*  --   --   --   ALBUMIN 1.8*  1.7*   < > 1.7*  1.6*   < > 1.7*   < > 2.9* 2.7* 2.7*  --   --   --   AST 33  --  31  --  25   < > 29 20 21   --   --   --   ALT 31  --  31  --  18   < > 44 33 34  --   --   --   ALKPHOS 84  --  79  --  111   < > 61 54 51  --   --   --   BILITOT 3.2*  --  2.5*  --  0.8   < > 1.3* 0.9 1.1  --   --   --   BILIDIR 1.7*  --  1.3*  --  0.2  --   --   --   --   --   --   --   IBILI 1.5*  --  1.2*  --  0.6  --   --   --   --   --   --   --    < > = values in this interval not displayed.    Studies Reviewed:  DG Swallowing Func-Speech  Pathology Result Date: 04/28/2023 Table formatting from the original result was not included. Modified Barium Swallow Study Patient Details Name: Bakari Nikolai MRN: [id number] Date of Birth: [birth year] Today's Date: 04/28/2023 HPI/PMH: HPI: 73 yo male presents to [facility] on 2/10 with LUE and LE weakness, and change in mentation. Head CT of brain shows a recurrent tumor at the craniotomy site and increased size of recurrent tumor along with a left midline shift up to 6 mm. 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 Pt's swallowing was evaluated when he was admitted in June of 2024 - he was D/Cd on a dysphagia 3 diet with thin liquids with no f/u recommended. Pt's swallowing assessed again on 04/08/23 this admission with recommendation of a regular diet and thin liquids. Hospital course complicated by concern for neck swelling, acute dysphagia, and concern for airway protection prompting intubation from 02/28-03/02. Neck CT on 04/26/23: demonstrated "No acute finding. Assessment of mucosal surfaces limited by airway  collapse in the setting of intubation.". Clinical Impression: Pt presents with a moderate-severe oral  dysphagia and a mild pharyngeal dysphagia per results of MBSS completed today. Oral deficits included poor bolus awareness, oral holding, reduced labial seal resulting in anterior spillage, poor lingual movement or initiation for oral transit, and reduced oral coordination resulting in posterior loss of trials. Oral deficits were more pronounced with nectar-thick liquid, puree, and soft solid trials. Soft solid trial was removed from oral cavity due to minimal-no mastication efforts.  Pt required multiple verbal and tactile cues to eventually initiate oral transit of nectar-thick and puree  trials. Tactile cues included downward pressure of spoon on tongue multiple times. Pharyngeal dysphagia characterized by reduced hyolaryngeal elevation, brief mistiming of pharyngeal swallow initiation, reduced base of tongue retraction, reduced laryngeal vestibule closure, and reduced pharyngeal stripping. Findings: -There was x1 instance of trace-min silent aspiration of thin liquids (via straw) before the swallow. Aspiration occurred posteriorly from the pyriform sinuses due to pooling of liquids before swallow was initiated. Aspiration event was completely silent without cough, water eyes, increased RR, or any other subtle s/s of aspiration. -There was intermittent, trace, shallow, transient penetration of nectar-thick and thin liquids by cup. -There was shallow penetration without ejection of mixture of thin and nectar-thick residue towards end of study. Penetrated residue appeared to move slowly towards the airway, though remained above the vocal cords during assessment. Pt unable to follow directions for strong cough to attempt to clear residue. Diet recommendations outlined below. SLP will follow up for ongoing diet tolerance assessment and therapeutic PO trials of pureed solids to address oral deficits. Recommend continue tube feeds via Cortrak for nutritional support and medication delivery. Factors that may increase risk of adverse event in presence of aspiration Rubye Oaks & Clearance Coots 2021): Factors that may increase risk of adverse event in presence of aspiration Rubye Oaks & Clearance Coots 2021): Reduced cognitive function; Presence of tubes (ETT, trach, NG, etc.); Weak cough; Dependence for feeding and/or oral hygiene; Inadequate oral hygiene Recommendations/Plan: Swallowing Evaluation Recommendations Swallowing Evaluation Recommendations Recommendations: PO diet PO Diet Recommendation: Clear liquid diet; Thin liquids (Level 0) (ok for any thin liquids) Liquid Administration via: Cup; No straw Medication  Administration: Via alternative means Supervision: Staff to assist with self-feeding; Full supervision/cueing for swallowing strategies Swallowing strategies  : Minimize environmental distractions; Small bites/sips; Check for anterior loss Postural changes: Position pt fully upright for meals; Stay upright 30-60 min after meals Oral care recommendations: Oral care QID (4x/day); Staff/trained caregiver to provide oral care; Use suctioning for oral care Caregiver Recommendations: Have oral suction available Treatment Plan Treatment Plan Treatment recommendations: Therapy as outlined in treatment plan below Follow-up recommendations: Follow physicians's recommendations for discharge plan and follow up therapies (SLP services at next level of care) Functional status assessment: Patient has had a recent decline in their functional status and demonstrates the ability to make significant improvements in function in a reasonable and predictable amount of time. Treatment frequency: Min 2x/week Treatment duration: 2 weeks Interventions: Aspiration precaution training; Compensatory techniques; Patient/family education; Trials of upgraded texture/liquids; Diet toleration management by SLP; Oropharyngeal exercises Recommendations Recommendations for follow up therapy are one component of a multi-disciplinary discharge planning process, led by the attending physician.  Recommendations may be updated based on patient status, additional functional criteria and insurance authorization. Assessment: Orofacial Exam: Orofacial Exam Oral Cavity: Oral Hygiene: WFL Oral Cavity - Dentition: Adequate natural dentition Orofacial Anatomy: WFL Oral Motor/Sensory Function: Suspected cranial nerve impairment (limited assessment, though concern for oral apraxia) Anatomy: Anatomy: WFL Boluses Administered: Boluses Administered Boluses Administered: Mildly thick liquids (Level 2,  nectar thick); Thin liquids (Level 0); Puree; Solid  Oral Impairment  Domain: Oral Impairment Domain Lip Closure: Escape beyond mid-chin Tongue control during bolus hold: Posterior escape of greater than half of bolus Bolus preparation/mastication: Minimal chewing/mashing with majority of bolus unchewed (removed from oral cavity) Bolus transport/lingual motion: Minimal-no tongue motion Oral residue: Majority of bolus remaining Location of oral residue : Floor of mouth; Tongue Initiation of pharyngeal swallow : Pyriform sinuses  Pharyngeal Impairment Domain: Pharyngeal Impairment Domain Soft palate elevation: No bolus between soft palate (SP)/pharyngeal wall (PW) Laryngeal elevation: Partial superior movement of thyroid cartilage/partial approximation of arytenoids to epiglottic petiole Anterior hyoid excursion: Complete anterior movement Epiglottic movement: Complete inversion Laryngeal vestibule closure: Incomplete, narrow column air/contrast in laryngeal vestibule Pharyngeal stripping wave : Present - diminished Pharyngeal contraction (A/P view only): N/A Pharyngoesophageal segment opening: Complete distension and complete duration, no obstruction of flow Tongue base retraction: Trace column of contrast or air between tongue base and PPW Pharyngeal residue: Collection of residue within or on pharyngeal structures Location of pharyngeal residue: Tongue base; Valleculae  Esophageal Impairment Domain: No data recorded Pill: No data recorded Penetration/Aspiration Scale Score: Penetration/Aspiration Scale Score 1.  Material does not enter airway: Thin liquids (Level 0); Mildly thick liquids (Level 2, nectar thick); Puree 2.  Material enters airway, remains ABOVE vocal cords then ejected out: Thin liquids (Level 0); Mildly thick liquids (Level 2, nectar thick) 3.  Material enters airway, remains ABOVE vocal cords and not ejected out: Thin liquids (Level 0); Mildly thick liquids (Level 2, nectar thick) 8.  Material enters airway, passes BELOW cords without attempt by patient to eject out  (silent aspiration) : Thin liquids (Level 0) Compensatory Strategies: Compensatory Strategies Compensatory strategies: Yes Straw: Ineffective Ineffective Straw: Thin liquid (Level 0) Other(comment): Ineffective (tactile pressure with spoon in effort to initiate oral transit) Ineffective Other(comment): Mildly thick liquid (Level 2, nectar thick); Moderately thick liquid (Level 3, honey thick)   General Information: Caregiver present: No (follow up education completed on phone with daughter)  Diet Prior to this Study: NPO; Cortrak/Small bore NG tube   Temperature : Febrile   Respiratory Status: WFL   Supplemental O2: Nasal cannula (2L)   History of Recent Intubation: Yes  Behavior/Cognition: Alert; Cooperative; Pleasant mood Self-Feeding Abilities: Needs set-up for self-feeding Baseline vocal quality/speech: -- (intermittent vocal wetness) Volitional Cough: Unable to elicit Volitional Swallow: Unable to elicit Exam Limitations: No limitations Goal Planning: Prognosis for improved oropharyngeal function: Fair Barriers to Reach Goals: Cognitive deficits No data recorded Patient/Family Stated Goal: drink water Consulted and agree with results and recommendations: Patient; Family member/caregiver; Nurse Pain: Pain Assessment Pain Assessment: No/denies pain Breathing: 0 Negative Vocalization: 0 Facial Expression: 0 Body Language: 1 Consolability: 0 PAINAD Score: 1 Facial Expression: 0 Body Movements: 0 Muscle Tension: 0 Compliance with ventilator (intubated pts.): N/A Vocalization (extubated pts.): 0 CPOT Total: 0 End of Session: Start Time:SLP Start Time (ACUTE ONLY): 0900 Stop Time: SLP Stop Time (ACUTE ONLY): 0930 Time Calculation:SLP Time Calculation (min) (ACUTE ONLY): 30 min Charges: SLP Evaluations $ SLP Speech Visit: 1 Visit SLP Evaluations $BSS Swallow: 1 Procedure $MBS Swallow: 1 Procedure SLP visit diagnosis: SLP Visit Diagnosis: Dysphagia, oropharyngeal phase (R13.12) Past Medical History: Past Medical  History: Diagnosis Date  Cancer (HCC)   prostate  Follicular lymphoma (HCC) 2024  History of pulmonary embolism   Hypertension   Myelodysplastic syndrome (HCC)   PAF (paroxysmal atrial fibrillation) (HCC)   Recurrent UTI  Past Surgical History: Past Surgical History:  Procedure Laterality Date  APPLICATION OF CRANIAL NAVIGATION Right 03/10/2023  Procedure: APPLICATION OF CRANIAL NAVIGATION;  Surgeon: Tressie Stalker, MD;  Location: [facility] OR;  Service: Neurosurgery;  Laterality: Right;  BIOPSY  08/21/2022  Procedure: BIOPSY;  Surgeon: Beverley Fiedler, MD;  Location: [facility] ENDOSCOPY;  Service: Gastroenterology;;  CRANIOTOMY Right 03/10/2023  Procedure: PARIETAL CRANIOTOMY;  Surgeon: Tressie Stalker, MD;  Location: [facility] OR;  Service: Neurosurgery;  Laterality: Right;  ESOPHAGOGASTRODUODENOSCOPY (EGD) WITH PROPOFOL N/A 08/21/2022  Procedure: ESOPHAGOGASTRODUODENOSCOPY (EGD) WITH PROPOFOL;  Surgeon: Beverley Fiedler, MD;  Location: [facility] ENDOSCOPY;  Service: Gastroenterology;  Laterality: N/A;  IR IMAGING GUIDED PORT INSERTION  08/26/2022  LYMPHADENECTOMY Bilateral 09/01/2019  Procedure: LYMPHADENECTOMY;  Surgeon: Sebastian Ache, MD;  Location: [facility] ORS;  Service: Urology;  Laterality: Bilateral;  ROBOT ASSISTED LAPAROSCOPIC RADICAL PROSTATECTOMY N/A 09/01/2019  Procedure: XI ROBOTIC ASSISTED LAPAROSCOPIC RADICAL PROSTATECTOMY;  Surgeon: Sebastian Ache, MD;  Location: [facility] ORS;  Service: Urology;  Laterality: N/A;  3 HRS [name] 04/28/2023, 11:06 AM  Overnight EEG with video Result Date: 04/28/2023 Charlsie Quest, MD     04/29/2023 10:44 AM Patient Name: Aaryan Essman MRN: [id number] Epilepsy Attending: Charlsie Quest Referring Physician/Provider: Gordy Councilman, MD Duration: 04/27/2023 1608 to 04/28/2023 1608 Patient history: 73yo M patient with 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 EEG to evaluate for seizure Level of alertness: Awake/lethargic AEDs during EEG study: LEV, LCM Technical aspects: This EEG study was  done with scalp electrodes positioned according to the 10-20 International system of electrode placement. Electrical activity was reviewed with band pass filter of 1-70Hz , sensitivity of 7 uV/mm, display speed of 60mm/sec with a 60Hz  notched filter applied as appropriate. EEG data were recorded continuously and digitally stored.  Video monitoring was available and reviewed as appropriate. Description: No clear posterior dominant rhythm was seen.  EEG showed continuous generalized and maximal right parietal 3 to 6 Hz theta-delta slowing. Lateralized periodic discharges were noted in right hemisphere, maximal right parietal region at 1 Hz, at times with overlying rhythmicity. Seizures without clinical signs were noted arising from right parietal region, average 5 seizures per hour, lasting 1 to 2 minutes each.  During seizure EEG showed 5 to 6 Hz theta slowing admixed with sharp waves and right parietal region which then involved all of right hemisphere and evolved into 2 to 3 Hz delta slowing.  IV Vimpat was administered on 04/28/2023 at around 1042. Gradually after around 1300, EEG improved and showed lateralized periodic discharges in right hemisphere, maximal right parietal region at 1hz  with overlying rhythmicity.  Hyperventilation and photic stimulation were not performed.   ABNORMALITY -Seizure without clinical signs, right parietal region -Lateralized periodic discharges with overlying rhythmicity, right hemisphere, maximal right parietal  region ( LPD+R) -Continuous slow, generalized and maximal right parietal region IMPRESSION: This study showed seizures without clinical signs arising from right parietal region, average 5 seizures per hour lasting 1 to 2 minutes each.  IV Vimpat was administered on 04/28/2023 at around 1042.  Gradually after around 1300, EEG improved and no definite seizures were noted. Additionally there is evidence of epileptogenicity and cortical dysfunction arising from right hemisphere,  maximal right parietal region likely secondary to underlying structural abnormality.  Charlsie Quest   DG CHEST PORT 1 VIEW Result Date: 04/27/2023 CLINICAL DATA:  Acute hypoxemic respiratory failure. EXAM: PORTABLE CHEST 1 VIEW COMPARISON:  Chest radiograph dated 04/26/2023. FINDINGS: The heart is enlarged. An endotracheal tube terminates in the midthoracic trachea. An enteric  tube enters the stomach and terminates below the field of view. A right internal jugular central venous port catheter tip overlies the right atrium. There is mild bibasilar atelectasis/airspace disease, similar to prior exam. IMPRESSION: Mild bibasilar atelectasis/airspace disease, similar to prior exam. Electronically Signed   By: Romona Curls M.D.   On: 04/27/2023 14:57   ECHOCARDIOGRAM COMPLETE Result Date: 04/26/2023    ECHOCARDIOGRAM REPORT   Patient Name:   DAM ASHRAF Date of Exam: 04/26/2023 Medical Rec #:  [id number]    Height:       67.0 in Accession #:    [id number]   Weight:       184.1 lb Date of Birth:  1950/07/26    BSA:          1.952 m Patient Age:    72 years     BP:           151/88 mmHg Patient Gender: M            HR:           48 bpm. Exam Location:  Inpatient Procedure: 2D Echo, Color Doppler and Cardiac Doppler (Both Spectral and Color            Flow Doppler were utilized during procedure). Indications:    Resp Distress  History:        Patient has prior history of Echocardiogram examinations, most                 recent 10/03/2022. Chemo; Risk Factors:Hypertension.  Sonographer:    Amy Chionchio Referring Phys: [id number] MURALI RAMASWAMY IMPRESSIONS  1. Left ventricular ejection fraction, by estimation, is 60 to 65%. The left ventricle has normal function. The left ventricle has no regional wall motion abnormalities. There is mild left ventricular hypertrophy. Left ventricular diastolic parameters are indeterminate.  2. Right ventricular systolic function is normal. The right ventricular size is normal. Tricuspid  regurgitation signal is inadequate for assessing PA pressure.  3. The mitral valve is normal in structure. Trivial mitral valve regurgitation. No evidence of mitral stenosis.  4. The aortic valve is tricuspid. Aortic valve regurgitation is not visualized. No aortic stenosis is present.  5. The inferior vena cava is dilated in size with <50% respiratory variability, suggesting right atrial pressure of 15 mmHg. FINDINGS  Left Ventricle: Left ventricular ejection fraction, by estimation, is 60 to 65%. The left ventricle has normal function. The left ventricle has no regional wall motion abnormalities. Strain imaging was not performed. The left ventricular internal cavity  size was normal in size. There is mild left ventricular hypertrophy. Left ventricular diastolic parameters are indeterminate. Right Ventricle: The right ventricular size is normal. No increase in right ventricular wall thickness. Right ventricular systolic function is normal. Tricuspid regurgitation signal is inadequate for assessing PA pressure. Left Atrium: Left atrial size was normal in size. Right Atrium: Right atrial size was normal in size. Pericardium: There is no evidence of pericardial effusion. Mitral Valve: The mitral valve is normal in structure. Trivial mitral valve regurgitation. No evidence of mitral valve stenosis. MV peak gradient, 2.4 mmHg. The mean mitral valve gradient is 1.0 mmHg. Tricuspid Valve: The tricuspid valve is normal in structure. Tricuspid valve regurgitation is trivial. Aortic Valve: The aortic valve is tricuspid. Aortic valve regurgitation is not visualized. No aortic stenosis is present. Aortic valve mean gradient measures 3.0 mmHg. Aortic valve peak gradient measures 5.7 mmHg. Aortic valve area, by VTI measures 2.11 cm. Pulmonic Valve: The pulmonic valve  was grossly normal. Pulmonic valve regurgitation is not visualized. Aorta: The aortic root and ascending aorta are structurally normal, with no evidence of  dilitation. Venous: The inferior vena cava is dilated in size with less than 50% respiratory variability, suggesting right atrial pressure of 15 mmHg. IAS/Shunts: The interatrial septum was not well visualized. Additional Comments: 3D imaging was not performed.  LEFT VENTRICLE PLAX 2D LVIDd:         4.20 cm     Diastology LVIDs:         2.70 cm     LV e' medial:    5.11 cm/s LV PW:         1.10 cm     LV E/e' medial:  14.0 LV IVS:        0.90 cm     LV e' lateral:   7.94 cm/s LVOT diam:     2.20 cm     LV E/e' lateral: 9.0 LV SV:         58 LV SV Index:   30 LVOT Area:     3.80 cm  LV Volumes (MOD) LV vol d, MOD A2C: 69.9 ml LV vol d, MOD A4C: 90.8 ml LV vol s, MOD A2C: 23.8 ml LV vol s, MOD A4C: 29.9 ml LV SV MOD A2C:     46.1 ml LV SV MOD A4C:     90.8 ml LV SV MOD BP:      53.6 ml RIGHT VENTRICLE             IVC RV Basal diam:  3.10 cm     IVC diam: 2.20 cm RV S prime:     14.90 cm/s TAPSE (M-mode): 1.7 cm LEFT ATRIUM             Index        RIGHT ATRIUM          Index LA Vol (A2C):   31.3 ml 16.03 ml/m  RA Area:     9.52 cm LA Vol (A4C):   54.0 ml 27.66 ml/m  RA Volume:   14.30 ml 7.32 ml/m LA Biplane Vol: 43.6 ml 22.33 ml/m  AORTIC VALVE                    PULMONIC VALVE AV Area (Vmax):    2.48 cm     PV Vmax:       0.77 m/s AV Area (Vmean):   2.33 cm     PV Peak grad:  2.4 mmHg AV Area (VTI):     2.11 cm AV Vmax:           119.00 cm/s AV Vmean:          78.400 cm/s AV VTI:            0.275 m AV Peak Grad:      5.7 mmHg AV Mean Grad:      3.0 mmHg LVOT Vmax:         77.50 cm/s LVOT Vmean:        48.100 cm/s LVOT VTI:          0.153 m LVOT/AV VTI ratio: 0.56  AORTA Ao Root diam: 3.30 cm Ao Asc diam:  3.40 cm MITRAL VALVE MV Area (PHT): 2.70 cm    SHUNTS MV Area VTI:   2.10 cm    Systemic VTI:  0.15 m MV Peak grad:  2.4 mmHg    Systemic Diam: 2.20 cm MV Mean  grad:  1.0 mmHg MV Vmax:       0.77 m/s MV Vmean:      35.3 cm/s MV Decel Time: 281 msec MV E velocity: 71.70 cm/s MV A velocity: 83.10 cm/s MV  E/A ratio:  0.86 Epifanio Lesches MD Electronically signed by Epifanio Lesches MD Signature Date/Time: 04/26/2023/2:18:08 PM    Final    DG CHEST PORT 1 VIEW Result Date: 04/26/2023 CLINICAL DATA:  Endotracheal tube present. EXAM: PORTABLE CHEST 1 VIEW COMPARISON:  Radiograph yesterday.  CT earlier today FINDINGS: Endotracheal tube tip 4.6 cm from the carina. Weighted enteric tube tip below the diaphragm not included in the field of view. Right chest port unchanged in position. Stable heart size and mediastinal contours. Left lung base opacity has progressed from yesterday's radiograph, subtotal lower lobe collapse on CT. Additional streaky atelectasis in the left mid lung. Right lower lobe opacity on CT has no definite radiographic correlate. No pneumothorax or large pleural effusion. IMPRESSION: 1. Endotracheal tube tip 4.6 cm from the carina. 2. Left lung base opacity has progressed from yesterday's radiograph, subtotal lower lobe collapse on this morning's CT. Additional streaky atelectasis in the left mid lung. Electronically Signed   By: Narda Rutherford M.D.   On: 04/26/2023 13:27   CT CHEST W CONTRAST Result Date: 04/26/2023 CLINICAL DATA:  Head neck cancer.  Airway compromise with intubation EXAM: CT CHEST WITH CONTRAST TECHNIQUE: Multidetector CT imaging of the chest was performed during intravenous contrast administration. RADIATION DOSE REDUCTION: This exam was performed according to the departmental dose-optimization program which includes automated exposure control, adjustment of the mA and/or kV according to patient size and/or use of iterative reconstruction technique. CONTRAST:  OMNIPAQUE IOHEXOL 300 MG/ML  SOLN COMPARISON:  Head CT 04/16/2023 FINDINGS: Cardiovascular: Normal heart size. No pericardial effusion. Atheromatous calcification of the coronary arteries. Porta catheter with tip at the upper right atrium. Mediastinum/Nodes: Located endotracheal and enteric tube. No mass or  adenopathy. Lungs/Pleura: Multi segment opacification in the lower lobes with volume loss, worse on the left. No consolidation, edema, effusion, or pneumothorax. Upper Abdomen: Enteric tube with tip at the pylorus. No acute finding Musculoskeletal: Thoracic spondylosis.  No acute finding IMPRESSION: Multi segment atelectasis in the lower lobes Electronically Signed   By: Tiburcio Pea M.D.   On: 04/26/2023 06:31   CT HEAD WO CONTRAST ( ) Result Date: 04/26/2023 CLINICAL DATA:  Mental status change.  History of CNS lymphoma. EXAM: CT HEAD WITHOUT CONTRAST TECHNIQUE: Contiguous axial images were obtained from the base of the skull through the vertex without intravenous contrast. RADIATION DOSE REDUCTION: This exam was performed according to the departmental dose-optimization program which includes automated exposure control, adjustment of the mA and/or kV according to patient size and/or use of iterative reconstruction technique. COMPARISON:  Brain MRI 04/24/2023 FINDINGS: Brain: Cortical edema at the right insula correlating with abnormality on prior brain MRI. Small area of blood products in the parasagittal right parietal lobe with adjacent swelling, operative site. No evidence of interval infarct, hydrocephalus, or collection. Vascular: No hyperdense vessel or unexpected calcification. Skull: Unremarkable right parietal craniotomy site Sinuses/Orbits: Negative IMPRESSION: No acute finding when compared to 04/24/2023 brain MRI. Electronically Signed   By: Tiburcio Pea M.D.   On: 04/26/2023 05:09   CT SOFT TISSUE NECK W CONTRAST Result Date: 04/26/2023 CLINICAL DATA:  Soft tissue infection suspected. Neck swelling and airway compromise EXAM: CT NECK WITH CONTRAST TECHNIQUE: Multidetector CT imaging of the neck was performed using the standard protocol  following the bolus administration of intravenous contrast. RADIATION DOSE REDUCTION: This exam was performed according to the departmental  dose-optimization program which includes automated exposure control, adjustment of the mA and/or kV according to patient size and/or use of iterative reconstruction technique. CONTRAST:  OMNIPAQUE IOHEXOL 300 MG/ML  SOLN COMPARISON:  04/16/2023 head CT FINDINGS: Pharynx and larynx: The airway is collapsed around endotracheal tube and enteric tube with fluid layering in the nasopharynx. This limits assessment of the mucosal surfaces, no evidence of submucosal edema. Salivary glands: No inflammation, mass, or stone. Thyroid: Normal. Lymph nodes: None enlarged or heterogeneous. Vascular: No acute finding Limited intracranial: Negative Visualized orbits: Negative Mastoids and visualized paranasal sinuses: Clear Skeleton: No acute or aggressive finding. Upper chest: Clear apical lungs. IMPRESSION: No acute finding. Assessment of mucosal surfaces limited by airway collapse in the setting of intubation. Electronically Signed   By: Tiburcio Pea M.D.   On: 04/26/2023 05:03   DG Abd 1 View Result Date: 04/26/2023 CLINICAL DATA:  Check gastric catheter placement EXAM: ABDOMEN - 1 VIEW COMPARISON:  None Available. FINDINGS: Weighted feeding catheter is noted in the distal stomach directed towards the pole or is. No free air is seen. No obstructive changes are noted. IMPRESSION: Feeding catheter as described. Electronically Signed   By: Alcide Clever M.D.   On: 04/26/2023 02:11   DG CHEST PORT 1 VIEW Result Date: 04/25/2023 CLINICAL DATA:  Check endotracheal tube placement EXAM: PORTABLE CHEST 1 VIEW COMPARISON:  Film from earlier in the same day. FINDINGS: Endotracheal tube is noted 5.3 cm above the carina. Gastric catheter extends into the stomach. Right chest wall port is again seen. Cardiac shadow is stable. No focal infiltrate is noted. Mild basilar atelectasis is seen. IMPRESSION: Tubes and lines as described. Electronically Signed   By: Alcide Clever M.D.   On: 04/25/2023 21:44   DG Chest Port 1 View Result  Date: 04/25/2023 CLINICAL DATA:  Altered mental status EXAM: PORTABLE CHEST 1 VIEW COMPARISON:  04/10/2023 FINDINGS: Cardiac shadow is enlarged but stable. Right chest wall port is again seen. Mild basilar atelectasis is seen. No bony abnormality is noted. IMPRESSION: Mild basilar atelectasis. Electronically Signed   By: Alcide Clever M.D.   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 History of B-cell lymphoma EXAM: MRI HEAD WITHOUT AND WITH CONTRAST TECHNIQUE: Multiplanar, multiecho pulse sequences of the brain and surrounding structures were obtained without and with intravenous contrast. CONTRAST:  8mL GADAVIST GADOBUTROL 1 MMOL/ML IV SOLN COMPARISON:  Comparison made with previous MRI from 04/07/2023 as well as earlier studies. FINDINGS: Brain: Postoperative changes from prior right parietal craniotomy again seen. 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 Size of the lesion is markedly decreased from prior, now measuring 2.9 x 3.5 x 2.3 cm (series 18, image 10). Superimposed susceptibility artifact consistent with blood products. Persistent but improved vasogenic edema within the adjacent posterior right cerebral hemisphere, with improved/resolved mass effect on the right lateral ventricle. Previously seen right to left shift has resolved. Findings consistent with interval response to therapy. Now seen is restricted diffusion involving the right insular cortex and overlying posterior right frontal region (series 5, images 26, 30). Associated gyral swelling with T2/FLAIR signal abnormality within this location (series 9, image 28). No associated enhancement. 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 No other mass lesion or abnormal enhancement. No other  acute or subacute infarct. No hydrocephalus or extra-axial fluid collection. Underlying cerebral  white matter disease noted, stable. Vascular: Major intracranial vascular flow voids are maintained. Skull and upper cervical spine: Craniocervical junction within normal limits. Bone marrow signal intensity within normal limits. Post craniotomy changes at the right parietal calvarium without adverse features. Sinuses/Orbits: Globes and orbital soft tissues within normal limits. Mild mucosal thickening present about the ethmoidal air cells and maxillary sinuses. No mastoid effusion. Other: None. IMPRESSION: 1. Marked interval decrease in size of right parietal mass/lymphoma, now measuring 2.9 x 3.5 x 2.3 cm (previously 5.4 x 3.6 x 4.4 cm), consistent with interval response to therapy. Persistent but improved vasogenic edema within the adjacent posterior right cerebral hemisphere, with interval resolution of previously seen right-to-left midline shift. 2. 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 Correlation with symptomatology and EEG recommended. Evolving changes of subacute ischemia would be the primary differential consideration, but are felt to be less likely. Electronically Signed   By: Rise Mu M.D.   On: 04/25/2023 05:24   DG CHEST PORT 1 VIEW Result Date: 04/10/2023 CLINICAL DATA:  Shortness of breath.  History of lymphoma EXAM: PORTABLE CHEST 1 VIEW COMPARISON:  X-ray 08/11/2022.  PET-CT 12/27/2022 FINDINGS: Right IJ chest port with tip overlying the right atrium. The port is accessed. Widening of the mediastinum is again seen. Enlarged cardiac silhouette. Basilar atelectasis or scar. No pneumothorax. Film is underinflated. No edema or effusion. Degenerative changes of the spine IMPRESSION: Underinflation.  Basilar atelectasis or scar. Widened mediastinum. Similar previous. Please correlate with prior PET-CT 12/27/2022 demonstrating prominent mediastinal fat. Chest port Electronically Signed    By: Karen Kays M.D.   On: 04/10/2023 19:17   EEG adult Result Date: 04/10/2023 Charlsie Quest, MD     04/11/2023  8:38 AM Patient Name: Raudel Bazen MRN: [id number] Epilepsy Attending: Charlsie Quest Referring Physician/Provider: Johney Maine, MD Date: 04/10/2023 Duration: 22.17 mins Patient history: 73yo M patient with rt parietal CNS lymphoma with intermittent left sided painful burning paresthesias and weakness. EEG to evaluate for seizure Level of alertness: Awake, asleep AEDs during EEG study: None Technical aspects: This EEG study was done with scalp electrodes positioned according to the 10-20 International system of electrode placement. Electrical activity was reviewed with band pass filter of 1-70Hz , sensitivity of 7 uV/mm, display speed of 36mm/sec with a 60Hz  notched filter applied as appropriate. EEG data were recorded continuously and digitally stored.  Video monitoring was available and reviewed as appropriate. Description: The posterior dominant rhythm consists of 8-9 Hz activity of moderate voltage (25-35 uV) seen predominantly in posterior head regions, symmetric and reactive to eye opening and eye closing. Sleep was characterized by vertex waves, sleep spindles (12 to 14 Hz), maximal frontocentral region. EEG showed continuous sharply contoured 3 to 6 Hz theta-delta slowing in right parietal region consistent with breach artifact. Sharp waves were noted in right parietal region. Hyperventilation and photic stimulation were not performed.   ABNORMALITY - Sharp wave, right parietal region - Continuous slow,  right parietal region IMPRESSION: This study showed evidence of epileptogenicity arising from right parietal region. Additionally there is cortical dysfunction arising from right parietal region consistent with underlying craniotomy. No seizures were seen throughout the recording. Charlsie Quest   MR Brain W and Wo Contrast Result Date: 04/07/2023 CLINICAL DATA:  Provided  history: Neuro deficit, persistent/recurrent, CNS neoplasm suspected. Diffuse large B-cell  lymphoma. Left-sided weakness with midline shift on CT. EXAM: MRI HEAD WITHOUT AND WITH CONTRAST TECHNIQUE: Multiplanar, multiecho pulse sequences of the brain and surrounding structures were obtained without and with intravenous contrast. CONTRAST:  8mL GADAVIST GADOBUTROL 1 MMOL/ML IV SOLN COMPARISON:  Head CT 04/07/2023.  Brain MRI 03/07/2023 neck FINDINGS: Intermittently motion degraded examination. Most notably, the axial T1 post-contrast sequence and coronal T2 sequence are moderately motion degraded. Within this limitation findings are as follows. Brain: Since the prior MRI 06/05/2023, there has been right parietal craniotomy for biopsy/debulking of an enhancing right parietal lobe tumor. Non-acute blood products at the operative site. The residual enhancing tumor measures 5.4 x 3.6 x 4.4 cm (previously 4.4 x 3.0 x 3.3 when remeasured on prior). Prominent surrounding edema slightly increased. 6 mm leftward midline shift has also increased (previously 4 mm). Persistent effacement of portions of the right lateral ventricle. Persistent asymmetric prominence of the right lateral ventricle temporal horn consistent with ventricular entrapment. Trace postoperative subdural collection along the anterior right cerebral hemisphere. Mild-to-moderate multifocal T2 FLAIR hyperintense signal abnormality elsewhere within the cerebral white matter, nonspecific but compatible with chronic small vessel ischemic disease. There is no acute infarct. Vascular: Maintained flow voids within the proximal large arterial vessels. Skull and upper cervical spine: Right parietal cranioplasty. No focal worrisome marrow lesion. Sinuses/orbits: No orbital mass or acute orbital finding.Mild mucosal thickening within the left maxillary sinus. IMPRESSION: 1. Motion degraded examination. 2. Since the prior brain MRI 03/07/2023, there has been right  parietal craniotomy for biopsy/debulking of an enhancing right parietal lobe tumor. The residual tumor has increased in size, now measuring 5.4 x 3.6 x 4.4 cm (previously 4.4 x 3.0 x 3.3 cm). Prominent surrounding edema has slightly increased. 6 mm leftward midline shift has also increased. Unchanged asymmetric prominence of the right lateral ventricle temporal horn consistent with ventricular entrapment. Electronically Signed   By: Jackey Loge D.O.   On: 04/07/2023 14:01   CT Head Wo Contrast Result Date: 04/07/2023 CLINICAL DATA:  Facial paralysis/weakness (CN 7) EXAM: CT HEAD WITHOUT CONTRAST TECHNIQUE: Contiguous axial images were obtained from the base of the skull through the vertex without intravenous contrast. RADIATION DOSE REDUCTION: This exam was performed according to the departmental dose-optimization program which includes automated exposure control, adjustment of the mA and/or kV according to patient size and/or use of iterative reconstruction technique. COMPARISON:  Head CT 03/06/2023, brain MR 03/07/2023 FINDINGS: Brain: No hemorrhage. No hydrocephalus. No extra-axial fluid collection postsurgical changes from a right parietal craniotomy with interval increase in size of the residual/recurrent tumor at the craniotomy site, now measuring up to 4.7 x 2.7 cm, previously 3.2 x 2.5 cm, when measured in a similar orientation. There is increased leftward midline shift, now measuring up to 6 mm, previously 3 mm. Unchanged disproportionate enlargement of the right temporal horn, worrisome for ventricular entrapment. Vascular: No hyperdense vessel or unexpected calcification. Skull: Normal. Negative for fracture or focal lesion. Status post parietal craniotomy Sinuses/Orbits: No middle ear or mastoid effusion. Mucosal thickening in the bilateral maxillary sinuses, right-greater-than-left orbits are unremarkable. Other: None. IMPRESSION: 1. Postsurgical changes from a right parietal craniotomy with  interval increase in size of the residual/recurrent tumor at the craniotomy site, now measuring up to 4.7 x 2.7 cm, previously 3.2 x 2.5 cm. There is increased leftward midline shift, now measuring up to 6 mm, previously 3 mm. Recommend further evaluation with brain MRI. 2. Unchanged asymmetric enlargement of the right temporal horn, compatible with ventricular entrapment  Electronically Signed   By: Lorenza Cambridge M.D.   On: 04/07/2023 10:57

## 2023-05-01 NOTE — Progress Notes (Signed)
vLTM maintenance  All impedances below 10k.  No skin breakdown noted at FP1  FP2  A1  A2

## 2023-05-01 NOTE — Progress Notes (Signed)
 Speech Language Pathology Treatment: Dysphagia  Patient Details Name: Tyler [name] MRN: [id number] DOB: 1950-07-17 Today's Date: 05/01/2023 Time: 7829-5621 SLP Time Calculation (min) (ACUTE ONLY): 24 min  Assessment / Plan / Recommendation Clinical Impression  Per discussion with RN, pt had excellent intake of puree breakfast tray (100%) with minimal to no coughing observed. SLP re-attempted Dys 2 PO trials with continued poor oral control and moderate-large anterior loss. Pt continues to exhibit significant anterior loss of thin liquids by cup (more compared to yesterday). Improved oral control of Ensure which is slightly thick. Discussed recommendation to repeat MBSS given ongoing fevers and to see if pt could use straws safely. CXR results pending from today. WBC is WNL and pt remains on RA. Daughter at bedside at end of session and updated.   Plan: MBS tomorrow, 3/7   HPI HPI: 73 yo male [facility] on 2/10 with LUE and LE weakness, and change in mentation. Head CT of brain shows a recurrent tumor at the craniotomy site and increased size of recurrent tumor along with a left midline shift up to 6 mm. 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 Pt's swallowing was evaluated when he was admitted in June of 2024 - he was D/Cd on a dysphagia 3 diet with thin liquids with no f/u recommended. Pt's swallowing assessed again on 04/08/23 this admission with recommendation of a regular diet and thin liquids. Hospital course complicated by concern for neck swelling, acute dysphagia, and concern for airway protection prompting intubation from 02/28-03/02. Neck CT on 04/26/23: demonstrated "No acute finding. Assessment of mucosal surfaces limited by airway  collapse in the setting of  intubation.".Per Neurology, pt continues to have ongoing seizures. He continues to be on EEG.      SLP Plan  Continue with current plan of care      Recommendations for follow up therapy are one component of a multi-disciplinary discharge planning process, led by the attending physician.  Recommendations may be updated based on patient status, additional functional criteria and insurance authorization.    Recommendations  Diet recommendations: Dysphagia 1 (puree);Thin liquid Liquids provided via: Cup Medication Administration: Crushed with puree Supervision: Staff to assist with self feeding;Full supervision/cueing for compensatory strategies Compensations: Slow rate;Small sips/bites;Monitor for anterior loss;Minimize environmental distractions Postural Changes and/or Swallow Maneuvers: Upright 30-60 min after meal;Seated upright 90 degrees                  Oral care BID   Frequent or constant Supervision/Assistance Dysphagia, oropharyngeal phase (R13.12);Apraxia (R48.2);Aphasia (R47.01);Dysarthria and anarthria (R47.1);Attention and concentration deficit     Continue with current plan of care     Tyler [name]  05/01/2023, 11:56 AM

## 2023-05-01 NOTE — Progress Notes (Signed)
 Subjective: Continues to have intermittent seizures during which she has left facial twitching.  He is able to communicate and follow commands during the seizures.  Tmax 102.4  ROS: negative except above Examination  Vital signs in last 24 hours: Temp:  [100.3 F (37.9 C)-102.4 F (39.1 C)] 101.9 F (38.8 C) (03/06 1600) Pulse Rate:  [80-114] 106 (03/06 1800) Resp:  [18-31] 25 (03/06 1800) BP: (104-175)/(64-96) 104/64 (03/06 1800) SpO2:  [88 %-99 %] 94 % (03/06 1800)  General: lying in bed, NAD Neuro: Alert, oriented, follows commands, cranial nerves appear grossly intact, 5/5 in right upper and right lower extremity, 4/5 in left lower extremity, 3/5 in left upper extremity  Basic Metabolic Panel: Recent Labs  Lab 04/27/23 0432 04/28/23 1120 04/28/23 2124 04/29/23 0432 04/29/23 1652 04/30/23 0449 05/01/23 0559  NA 140 138  --  137  --  137 136  K 3.6 4.2  --  4.0  --  4.4 4.0  CL 104 100  --  102  --  100 104  CO2 28 29  --  25  --  27 26  GLUCOSE 112* 105*  --  140*  --  150* 128*  BUN 26* 18  --  22  --  25* 30*  CREATININE 0.93 1.07  --  0.88  --  0.86 0.98  CALCIUM 8.0* 8.2*  --  8.1*  --  8.4* 7.7*  MG 2.0 1.9 1.9 1.8 2.1 1.9 1.9  PHOS 2.9 3.2 2.7 2.6 2.4*  --   --     CBC: Recent Labs  Lab 04/25/23 0528 04/25/23 2245 04/26/23 0500 04/27/23 0432 04/28/23 0352 04/29/23 0432 04/30/23 0449 05/01/23 0559  WBC 7.7   < > 4.6 4.3 2.5* 3.6* 3.2* 4.0  NEUTROABS 5.7  --  4.2 3.4  --   --   --   --   HGB 13.3   < > 12.9* 12.4* 12.9* 13.3 12.2* 11.5*  HCT 39.6   < > 40.0 38.3* 40.3 40.1 37.4* 35.3*  MCV 90.2   < > 93.0 92.3 93.9 91.6 91.4 91.9  PLT 134*   < > 149* 140* 138* 139* 132* 129*   < > = values in this interval not displayed.     Coagulation Studies: No results for input(s): "LABPROT", "INR" in the last 72 hours.  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   Epilepsy with breakthrough seizure Cerebral edema B-cell lymphoma -In the setting of underlying lymphoma and edema   Recommendations -Will order one-time dose of phenobarbital 260 mg -Start maintenance perampanel 2 mg daily -Increase Vimpat to 150 mg twice daily -Continue Keppra 2000 mg twice daily -Continue video EEG to monitor for intermittent seizures -If any further seizures, can increase perampanel to 4 mg, Vimpat to 200 mg -Discussed plan with Dr. Everardo All via secure chat -Discussed plan in detail with patient's wife, daughter and son-in-law as well as oncology nurse practitioner.  Patient has a potentially curable cancer and has very good baseline quality of life.  He is supposed to get methotrexate to 2 weeks (next dose due next week).  So far he is tolerating his antiseizure  medications without excessive sedation.  Therefore goal is to continue to escalate treatment and if needed family is okay with proceeding with intubation.  However if seizures are not fully controlled by next week, oncology team will try to administer methotrexate while inpatient -Continue seizure precautions -As needed IV Ativan for clinical seizures   I have spent a total of  with the patient reviewing hospital notes,  test results, labs and examining the patient as well as establishing an assessment and plan.  > 50% of time was spent in direct patient care.    Lindie Spruce Epilepsy [facility] For questions after 5pm please refer to AMION to reach the Neurologist on call

## 2023-05-01 NOTE — Progress Notes (Signed)
 Occupational Therapy Treatment Patient Details Name: Tyler [name] MRN: [id number] DOB: Jun 04, 1950 Today's Date: 05/01/2023   History of present illness 73 yo male presents on 2/25 for second cycle of high dose methotrexate with leucovorin rescue. 2/27 MRI showed marked interval decrease in size of right parietal mass/lymphoma and New diffusion signal abnormality involving the right insular  cortex and overlying right frontal lobe, favored to reflect sequelae of seizure. 2/28 transferred to ICU and intubated. 3/2 extubated. Significant PMH: CNS relapse of large B-cell lymphoma, partial seizure, paroxysmal atrial fibrillation not on anticoagulation due to bleeding risk, prostate cancer status post prostatectomy in 2021, pulmonary embolism.   OT comments  Patient received in supine and agreeable to OT session. RN disconnected EEG for transfer to recliner and reconnected once in chair. Patient was CGA to get to EOB with increased time to follow directions and perform tasks. Patient was min assist with HHA of one for sit to stand and transfer to recliner. Patient performed grooming tasks seated in recliner with cues for sequencing due to perseverating on tasks. Patient attempting to use LUE with grooming but unable to hold washcloth. AROM of LUE for grasp release performed to increase functional use with tasks. Patient will benefit from intensive inpatient follow-up therapy, >3 hours/day. Acute OT to continue to follow to address established goals to facilitate DC to next venue of care.       If plan is discharge home, recommend the following:  A little help with walking and/or transfers;A lot of help with bathing/dressing/bathroom;Assistance with cooking/housework;Assistance with feeding;Assist for transportation;Help with stairs or ramp for entrance;Direct supervision/assist for medications management;Direct supervision/assist for financial management   Equipment Recommendations  Other (comment) (defer)     Recommendations for Other Services Rehab consult    Precautions / Restrictions Precautions Precautions: Fall;Other (comment) (seizure) Recall of Precautions/Restrictions: Impaired Precaution/Restrictions Comments: NGT, EEG Restrictions Weight Bearing Restrictions Per Provider Order: No       Mobility Bed Mobility Overal bed mobility: Needs Assistance Bed Mobility: Supine to Sit     Supine to sit: Contact guard     General bed mobility comments: cues to initiate and assistance with lines    Transfers Overall transfer level: Needs assistance Equipment used: 1 person hand held assist Transfers: Sit to/from Stand, Bed to chair/wheelchair/BSC Sit to Stand: Min assist     Step pivot transfers: Min assist     General transfer comment: cues for hand placement and safety with assistance for lines     Balance Overall balance assessment: Needs assistance Sitting-balance support: Feet supported, No upper extremity supported Sitting balance-Leahy Scale: Good Sitting balance - Comments: on EOB   Standing balance support: No upper extremity supported, During functional activity Standing balance-Leahy Scale: Poor Standing balance comment: External support from therapist                           ADL either performed or assessed with clinical judgement   ADL Overall ADL's : Needs assistance/impaired     Grooming: Wash/dry hands;Wash/dry face;Oral care;Minimal assistance;Sitting;Cueing for sequencing Grooming Details (indicate cue type and reason): perseverated on grooming tasks with frequent cues for sequencing                 Toilet Transfer: Minimal assistance Toilet Transfer Details (indicate cue type and reason): simulated to recliner with one person HHA           General ADL Comments: increased time to follow commands for self  care tasks and perseverating on tasks    Extremity/Trunk Assessment Upper Extremity Assessment LUE Deficits /  Details: AROM WFL, but decreased strength as compared to R LUE Sensation: decreased light touch LUE Coordination: decreased fine motor            Vision       Perception     Praxis     Communication Communication Communication: Impaired Factors Affecting Communication: Reduced clarity of speech   Cognition Arousal: Alert Behavior During Therapy: Flat affect, Impulsive Cognition: No family/caregiver present to determine baseline             OT - Cognition Comments: perseverated on grooming tasks                 Following commands: Impaired Following commands impaired: Follows one step commands inconsistently, Follows one step commands with increased time      Cueing   Cueing Techniques: Verbal cues, Gestural cues, Tactile cues  Exercises Exercises: General Upper Extremity General Exercises - Upper Extremity Digit Composite Flexion: AROM, Left, 10 reps Composite Extension: AROM, Left, 10 reps    Shoulder Instructions       General Comments on EEG at time of session, limited session to chair    Pertinent Vitals/ Pain       Pain Assessment Pain Assessment: Faces Faces Pain Scale: No hurt Pain Intervention(s): Monitored during session  Home Living                                          Prior Functioning/Environment              Frequency  Min 1X/week        Progress Toward Goals  OT Goals(current goals can now be found in the care plan section)  Progress towards OT goals: Progressing toward goals  Acute Rehab OT Goals Patient Stated Goal: none stated OT Goal Formulation: With patient Time For Goal Achievement: 05/13/23 Potential to Achieve Goals: Good ADL Goals Pt Will Perform Eating: sitting;with supervision Pt Will Perform Grooming: with supervision;standing Pt Will Perform Lower Body Bathing: sitting/lateral leans;with min assist;sit to/from stand Pt Will Perform Upper Body Dressing: with  supervision;sitting Pt Will Perform Lower Body Dressing: with supervision;sit to/from stand Pt Will Transfer to Toilet: with supervision;ambulating Pt Will Perform Toileting - Clothing Manipulation and hygiene: with supervision;sit to/from stand Pt/caregiver will Perform Home Exercise Program: Increased strength;Both right and left upper extremity;With theraband;With theraputty;With Supervision;With written HEP provided Additional ADL Goal #1: Pt will maintain midline postural control EOB wtih S in preparation for ADL tasks  Plan Discharge plan remains appropriate    Co-evaluation                 AM-PAC OT "6 Clicks" Daily Activity     Outcome Measure   Help from another person eating meals?: A Little Help from another person taking care of personal grooming?: A Little Help from another person toileting, which includes using toliet, bedpan, or urinal?: A Little Help from another person bathing (including washing, rinsing, drying)?: A Little Help from another person to put on and taking off regular upper body clothing?: A Little Help from another person to put on and taking off regular lower body clothing?: A Little 6 Click Score: 18    End of Session Equipment Utilized During Treatment: Gait belt  OT Visit Diagnosis: Unsteadiness on feet (R26.81);Other  abnormalities of gait and mobility (R26.89);Muscle weakness (generalized) (M62.81);Other symptoms and signs involving cognitive function   Activity Tolerance Patient tolerated treatment well   Patient Left in chair;with call bell/phone within reach;with chair alarm set   Nurse Communication Mobility status        Time: 9604-5409 OT Time Calculation (min): 23 min  Charges: OT General Charges $OT Visit: 1 Visit OT Treatments $Self Care/Home Management : 23-37 mins  Alfonse Flavors, OTA Acute Rehabilitation Services  Office [phone]   Dewain Penning 05/01/2023, 11:20 AM

## 2023-05-01 NOTE — Consult Note (Signed)
 NAME:  Tyler [name], MRN:  [id number], DOB:  11-15-1950, LOS: 9 ADMISSION DATE:  04/22/2023, CONSULTATION/ SERVICE  DATE:  04/25/23 REFERRING MD:  Dr. Candise Che, CHIEF COMPLAINT: Neck swelling, drooling, difficulty swallowing, altered mental status with concern for partial seizures  History of Present Illness:  Mr. Delpilar is a 73 year old gentleman with significant history of CNS relapse of large B-cell lymphoma, partial seizure who was admitted to cardiology service on 2/25 for second cycle of high-dose methotrexate with leucovorin rescue.  Patient also has prior history of paroxysmal atrial fibrillation not on anticoagulation due to bleeding risk, prostate cancer status post prostatectomy in 2021, pulmonary embolism, hepatitis B exposure.  Patient had high-grade large B-cell lymphoma and completed 6 cycles of R-CHOP with systemic remission in the past.  As per the primary oncologist, patient was doing well and receiving his medication as planned.  He was started on bicarb drip to minimize the methotrexate related toxicity.  However, this evening, oncologist noted increased neck swelling as well as patient had difficulty swallowing.  He was also noted to have drooling of saliva and developed altered mental status with facial twitching concerning for seizure activity.  There was concern about patient's ability to protect airway and possible angioedema (neck swelling), ED was consulted and patient was intubated emergently.  At the time of my evaluation, patient was intubated and unresponsive.  No family member at the bedside.  Rest of the information is as per the chart review and I was unable to independently verify with the patient.   PAST    has a past medical history of Cancer (HCC), Follicular lymphoma (HCC) (2024), History of pulmonary embolism, Hypertension, Myelodysplastic syndrome (HCC), PAF (paroxysmal atrial fibrillation) (HCC), and Recurrent UTI.   has a past surgical history that includes  Robot assisted laparoscopic radical prostatectomy (N/A, 09/01/2019); Lymphadenectomy (Bilateral, 09/01/2019); Esophagogastroduodenoscopy (egd) with propofol (N/A, 08/21/2022); biopsy (08/21/2022); IR IMAGING GUIDED PORT INSERTION (08/26/2022); Craniotomy (Right, 03/10/2023); and Application of cranial navigation (Right, 03/10/2023).   Significant events   04/22/2023 - ADMIT  2/27  - MRI brain - Marked interval decrease in size of right parietal mass/lymphoma, now measuring 2.9 x 3.5 x 2.3 cm (previously 5.4 x 3.6 x 4.4 cm), consistent with interval response to therapy. Persistent but improved vasogenic edema within the adjacent posterior right cerebral hemisphere, with interval resolution of previously seen right-to-left midline shift. 2. 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 Correlation with symptomatology and EEG recommended. Evolving changes of subacute ischemia would be the primary differential consideration, but are felt to be less likely.  2/28 - Transferred to ICU and intubated. Witnessed by Dr Candise Che with have left neck swelling, gurgling, and twitching left side -> then partially respnsvive -> ativan/confused -> EDP intubated. Around 7.30-8pm  04/26/23: Coretrack placed early hours .Intubated early hours.  On the ventilator FiO2 50%.  Low-grade fever since 04/17/2023 but normal white count. Ceribell EEG oongoing. Prmary language for aptient is Swahili. SBP HIGH with HR 40s - on scheduled lopressor . Ceribel shows    ceribell EEG is is suggestive of severe diffuse encephalopathy. No seizures were seen throughout the recording.   - Blood clture  - Tracheal  aspirate  3/2 Extubated. Remains on PANDA. Scheduled for barium today. Remains on cEEG per Neuro   SUBJECTIVE/OVERNIGHT/INTERVAL HX   Fevers continue likely due to seizure  Objective   Blood pressure (!) 142/78, pulse 88, temperature  (!) 101.8  F (38.8 C), temperature source Axillary, resp. rate (!) 26, height 5\' 7"  (1.702 m), weight 90.1 kg, SpO2 95%.        Intake/Output Summary (Last 24 hours) at 05/01/2023 0809 Last data filed at 05/01/2023 0600 Gross per 24 hour  Intake 1383.17 ml  Output 1850 ml  Net -466.83 ml   Filed Weights   04/28/23 0500 04/29/23 0450 04/30/23 0500  Weight: 89.2 kg 89.1 kg 90.1 kg   Physical Exam: General: Chronically ill-appearing, no acute distress HENT: [place], AT Eyes: EOMI, no scleral icterus Respiratory: Clear to auscultation bilaterally.  No crackles, wheezing or rales Cardiovascular: RRR, -M/R/G, no JVD GI: BS+, soft, nontender Extremities:-Edema,-tenderness Neuro: AAO x2, CNII-XII grossly intact, left facial twitching  Imaging, labs and test noted above have been reviewed independently by me.  WBC 40 BMET acceptable Mg 1.9   Assessment & Plan:   Partial sensory seizures, left side Acute encephalopathy  -on 04/25/2203 felt due to CNS lymphoma with edema -04/25/2023: Morning reports of relatively normal mental status but at night sudden worsening with obtundation and potential seizures and intubated. MRI Mass improvd but 2/27 - New diffusion signal abnormality involving the right insular cortex and overlying right frontal lobe, favored to reflect sequelae of seizure -3/2 - doing wake up exam without seizures P:  Neurology following. Managing AEDs. Keppra and Vimpat increased again 3/4. 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 S/p chemotherapy R-CHOP.  S/p craniotomy with gross total resection 03/10/23.  - CT of head 04/07/2023 showed interval increase in size of tumor at the craniotomy site. MRI of head 04/07/2023 increase in size of tumor with slightly increased surrounding edema. MRI brain 2/27  - improvmeent - Currently on Hi-dose methotrexate  chemotherapy regimen which is more aggressive approach.  Received cycle 1 on 04/09/23 and admitted for cycle 2 of HIGH DOSE Methotrexate chemotherapy regimen,  completed 04/23/2023. P: Oncology following  - Now methotrexate level < 0.1. Off sodium bicarb, off leucovorin. No longer monitoring urine PH --PO dexamethasone for brain edema - IV Rituxan as outpatient between 2nd and 3rd cycle of methotrexate and subsequently between treatment cycles. Plan for first week of March 2025 if discharged - can be delayed if needed Restart home multivitamins including b complex and folic acid 3/5  Acute respiratory failure 04/25/2023 evening associated with neck swelling  (likely fat tissue chages with seizures) and seizures and  postictal state  - s/p Intubation .   - Had neck swelling at time bu CT neck is normal and exam 3/1 - is fat tissue P:   Extubated 3/2 Passed barium however impulsive/choking. Tolerating full liquid diet Wean supplemental O2 for goal >90% Aspiration precautions Monitor airway in setting of increased AED administration GOC discussed with daughter regarding reintubation in setting of active seizures and recurrent lymphoma. Planning for meeting with Neurology and Oncology today with daughter  Hypertensive and Bradycardic on lopressor 04/26/23 Normal echo 04/26/23 P:  DC lopressor due to bradycardia 3/2 Start scheduled hydralazine 3/3 Tele  Hep B antibody positive, chronic P: On tenofovir for hep B reactivation suppression in context of Rituxan use. Plan is to continue for 6 months after his last chemotherapy. Follow ID outpatient  Intermittent fever  Likely secondary to seizure. No source of infection. RVP neg 3/2- current P: Monitor fever curve/WBC off antibiotics CXR today. Will add Unasyn if concerning for asp pna  Mild anemia of critical illness onset less than 13 g% 04/26/2023 P:  Trend Transfuse if needed for Hg goal >7  Mild thrombocytopenia - mild P: Lovenox 40 mg  every 24 hours    At risk for hypo and hyperglycemia P:   SSI Tube feeds   Best practice:  Diet: Full liquid diet Pain/Anxiety/Delirium protocol (if indicated): n/a VAP protocol (if indicated): n/a DVT prophylaxis: lovnox GI prophylaxis: Home PPI Glucose control: ssi Mobility: bed rest Code Status: full Family Communication:   3/1 - Daughter Sherron Flemings - > [phone] => updated her over phone. Also d/w Dr Candise Che  3/2 - Daughter Sherron Flemings -> upated over phone.  Explained need to go to [facility] 04/27/23 for c-EEG  3/3 - Updated daughter at bedside  3/4 Updated daughter by phone   ATTESTATION & SIGNATURE    The patient is critically ill with multiple organ systems failure and requires high complexity decision making for assessment and support, frequent evaluation and titration of therapies, application of advanced monitoring technologies and extensive interpretation of multiple databases.  Independent Critical Care Time: 40 Minutes.   Mechele Collin, M.D. [facility] Pulmonary/Critical Care Medicine 05/01/2023 8:09 AM   Please see Amion for pager number to reach on-call Pulmonary and Critical Care Team.

## 2023-05-01 NOTE — Progress Notes (Addendum)
 Subjective: No acute events overnight.  ROS: negative except above Examination  Blood pressure 134/81, pulse 82, temperature 99.7 F (37.6 C), temperature source Oral, resp. rate 20, height 5\' 7"  (1.702 m), weight 90.1 kg, SpO2 96%.    General: lying in bed, NAD Neuro: Alert, oriented, follows commands, cranial nerves appear grossly intact, 5/5 in right upper and right lower extremity, 4/5 in left lower extremity, 3/5 in left upper extremity  Basic Metabolic Panel: Recent Labs  Lab 04/27/23 0432 04/28/23 1120 04/28/23 2124 04/29/23 0432 04/29/23 1652 04/30/23 0449 05/01/23 0559  NA 140 138  --  137  --  137 136  K 3.6 4.2  --  4.0  --  4.4 4.0  CL 104 100  --  102  --  100 104  CO2 28 29  --  25  --  27 26  GLUCOSE 112* 105*  --  140*  --  150* 128*  BUN 26* 18  --  22  --  25* 30*  CREATININE 0.93 1.07  --  0.88  --  0.86 0.98  CALCIUM 8.0* 8.2*  --  8.1*  --  8.4* 7.7*  MG 2.0 1.9 1.9 1.8 2.1 1.9 1.9  PHOS 2.9 3.2 2.7 2.6 2.4*  --   --     CBC: Recent Labs  Lab 04/25/23 0528 04/25/23 2245 04/26/23 0500 04/27/23 0432 04/28/23 0352 04/29/23 0432 04/30/23 0449 05/01/23 0559  WBC 7.7   < > 4.6 4.3 2.5* 3.6* 3.2* 4.0  NEUTROABS 5.7  --  4.2 3.4  --   --   --   --   HGB 13.3   < > 12.9* 12.4* 12.9* 13.3 12.2* 11.5*  HCT 39.6   < > 40.0 38.3* 40.3 40.1 37.4* 35.3*  MCV 90.2   < > 93.0 92.3 93.9 91.6 91.4 91.9  PLT 134*   < > 149* 140* 138* 139* 132* 129*   < > = values in this interval not displayed.     Coagulation Studies: No results for input(s): "LABPROT", "INR" in the last 72 hours.  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   Epilepsy with breakthrough seizure Cerebral edema B-cell lymphoma -In the setting of underlying lymphoma and edema   Recommendations -Load with perampanel 8mg  -Continue Vimpat 100 mg twice daily and Keppra 2000 mg twice daily -Continue video EEG to monitor for intermittent seizures -If any further seizures, can add phenobarbital  -Discussed plan with Dr. Everardo All via secure chat -Continue seizure precautions -As needed IV Ativan for clinical seizures   I have spent a total of  35 minutes with the patient reviewing hospital notes,  test results, labs and examining the patient as well as establishing an assessment and plan.  > 50% of time was spent in direct patient care.   Lindie Spruce Epilepsy [facility] For questions after 5pm please refer to AMION to reach the Neurologist on call

## 2023-05-02 ENCOUNTER — Inpatient Hospital Stay (HOSPITAL_COMMUNITY)

## 2023-05-02 ENCOUNTER — Encounter (HOSPITAL_COMMUNITY)

## 2023-05-02 DIAGNOSIS — J9601 Acute respiratory failure with hypoxia: Secondary | ICD-10-CM | POA: Diagnosis not present

## 2023-05-02 DIAGNOSIS — G936 Cerebral edema: Secondary | ICD-10-CM | POA: Diagnosis not present

## 2023-05-02 DIAGNOSIS — G5139 Clonic hemifacial spasm, unspecified: Secondary | ICD-10-CM | POA: Diagnosis not present

## 2023-05-02 DIAGNOSIS — G40109 Localization-related (focal) (partial) symptomatic epilepsy and epileptic syndromes with simple partial seizures, not intractable, without status epilepticus: Secondary | ICD-10-CM | POA: Diagnosis not present

## 2023-05-02 DIAGNOSIS — Z8546 Personal history of malignant neoplasm of prostate: Secondary | ICD-10-CM | POA: Diagnosis not present

## 2023-05-02 DIAGNOSIS — C8339 Primary central nervous system lymphoma: Secondary | ICD-10-CM | POA: Diagnosis not present

## 2023-05-02 DIAGNOSIS — I10 Essential (primary) hypertension: Secondary | ICD-10-CM | POA: Diagnosis not present

## 2023-05-02 LAB — CBC
HCT: 34.1 % — ABNORMAL LOW (ref 39.0–52.0)
Hemoglobin: 11.5 g/dL — ABNORMAL LOW (ref 13.0–17.0)
MCH: 31 pg (ref 26.0–34.0)
MCHC: 33.7 g/dL (ref 30.0–36.0)
MCV: 91.9 fL (ref 80.0–100.0)
Platelets: 131 10*3/uL — ABNORMAL LOW (ref 150–400)
RBC: 3.71 MIL/uL — ABNORMAL LOW (ref 4.22–5.81)
RDW: 13.9 % (ref 11.5–15.5)
WBC: 3.4 10*3/uL — ABNORMAL LOW (ref 4.0–10.5)
nRBC: 0 % (ref 0.0–0.2)

## 2023-05-02 LAB — BASIC METABOLIC PANEL
Anion gap: 4 — ABNORMAL LOW (ref 5–15)
BUN: 29 mg/dL — ABNORMAL HIGH (ref 8–23)
CO2: 26 mmol/L (ref 22–32)
Calcium: 7.5 mg/dL — ABNORMAL LOW (ref 8.9–10.3)
Chloride: 105 mmol/L (ref 98–111)
Creatinine, Ser: 0.89 mg/dL (ref 0.61–1.24)
GFR, Estimated: >60 mL/min (ref >60)
Glucose, Bld: 131 mg/dL — ABNORMAL HIGH (ref 70–99)
Potassium: 3.9 mmol/L (ref 3.5–5.1)
Sodium: 135 mmol/L (ref 135–145)

## 2023-05-02 LAB — BLOOD GAS, VENOUS
Acid-Base Excess: 4.2 mmol/L — ABNORMAL HIGH (ref 0.0–2.0)
Bicarbonate: 28.4 mmol/L — ABNORMAL HIGH (ref 20.0–28.0)
O2 Saturation: 85.5 %
Patient temperature: 37
pCO2, Ven: 40 mmHg — ABNORMAL LOW (ref 44–60)
pH, Ven: 7.46 — ABNORMAL HIGH (ref 7.25–7.43)
pO2, Ven: 52 mmHg — ABNORMAL HIGH (ref 32–45)

## 2023-05-02 LAB — MAGNESIUM: Magnesium: 1.9 mg/dL (ref 1.7–2.4)

## 2023-05-02 LAB — GLUCOSE, CAPILLARY
Glucose-Capillary: 93 mg/dL (ref 70–99)
Glucose-Capillary: 99 mg/dL (ref 70–99)
Glucose-Capillary: 150 mg/dL — ABNORMAL HIGH (ref 70–99)
Glucose-Capillary: 129 mg/dL — ABNORMAL HIGH (ref 70–99)
Glucose-Capillary: 107 mg/dL — ABNORMAL HIGH (ref 70–99)

## 2023-05-02 MED ORDER — PERAMPANEL 2 MG PO TABS
4.0000 mg | ORAL_TABLET | Freq: Every day | ORAL | Status: DC
Start: 2023-05-02 — End: 2023-05-02

## 2023-05-02 MED ORDER — PERAMPANEL 2 MG PO TABS
4.0000 mg | ORAL_TABLET | Freq: Every day | ORAL | Status: DC
  Administered 2023-05-02: 4 mg
  Filled 2023-05-02: qty 2

## 2023-05-02 MED ORDER — OSMOLITE 1.5 CAL PO LIQD
1000.0000 mL | ORAL | Status: DC
  Administered 2023-05-02 – 2023-05-23 (×22): 1000 mL
  Filled 2023-05-02 (×5): qty 1000

## 2023-05-02 MED ORDER — PHENOBARBITAL 32.4 MG PO TABS
97.2000 mg | ORAL_TABLET | Freq: Every day | ORAL | Status: DC
  Administered 2023-05-02 – 2023-05-06 (×5): 97.2 mg
  Filled 2023-05-02 (×5): qty 3

## 2023-05-02 MED ORDER — LEVETIRACETAM 100 MG/ML PO SOLN
2000.0000 mg | Freq: Two times a day (BID) | ORAL | Status: DC
  Administered 2023-05-02 – 2023-05-20 (×37): 2000 mg
  Filled 2023-05-02 (×38): qty 20

## 2023-05-02 MED ORDER — LACOSAMIDE 200 MG PO TABS
200.0000 mg | ORAL_TABLET | Freq: Two times a day (BID) | ORAL | Status: DC
  Administered 2023-05-02 – 2023-05-20 (×37): 200 mg
  Filled 2023-05-02 (×38): qty 1

## 2023-05-02 NOTE — Progress Notes (Signed)
 PT Cancellation Note  Patient Details Name: Jamarl Pew MRN: [id number] DOB: 1950-11-13   Cancelled Treatment:    Reason Eval/Treat Not Completed: Other (comment) Discussed with RN; pt with active seizures. Will follow up when appropriate to participate in therapy session.  Lillia Pauls, PT, DPT Acute Rehabilitation Services Office [phone]    Norval Morton 05/02/2023, 9:20 AM

## 2023-05-02 NOTE — TOC Progression Note (Signed)
 Transition of Care [facility]) - Progression Note    Patient Details  Name: Tyler [name] MRN: [id number] Date of Birth: 09-Apr-1950  [facility]) CM/SW Contact  Mearl Latin, LCSW Phone Number: 05/02/2023, 10:11 AM  Clinical Narrative:    CSW continuing to follow.   Expected Discharge Plan: IP Rehab Facility Barriers to Discharge: Continued Medical Work up, Conservator, museum/gallery and Services In-house Referral: Clinical Social Work   Post Acute Care Choice: IP Rehab Living arrangements for the past 2 months: Single Family Home                                       Social Determinants of Health (SDOH) Interventions SDOH Screenings   Food Insecurity: No Food Insecurity (04/22/2023)  Housing: Low Risk  (04/22/2023)  Transportation Needs: No Transportation Needs (04/22/2023)  Utilities: Not At Risk (04/22/2023)  Financial Resource Strain: Not on File (06/14/2021)   Received from [place], [place]  Physical Activity: Not on File (06/14/2021)   Received from [place], [place]  Social Connections: Patient Declined (04/22/2023)  Recent Concern: Social Connections - Moderately Isolated (04/07/2023)  Stress: Not on File (06/14/2021)   Received from [place], [place]  Tobacco Use: Low Risk  (04/22/2023)    Readmission Risk Interventions    04/23/2023   12:41 PM 03/07/2023    2:25 PM 08/22/2022    3:06 PM  Readmission Risk Prevention Plan  Transportation Screening Complete Complete Complete  Home Care Screening  Complete   Medication Review (RN CM)  Referral to Pharmacy   Medication Review (RN Care Manager) Complete  Complete  PCP or Specialist appointment within 3-5 days of discharge Complete  Complete  HRI or Home Care Consult Complete  Complete  SW Recovery Care/Counseling Consult Complete  Complete  Palliative Care Screening Not Applicable  Complete  Skilled Nursing Facility Not Applicable  Complete

## 2023-05-02 NOTE — Progress Notes (Signed)
 Subjective: Continues to have intermittent seizure. T max 102.55F  ROS: negative except above  Examination  Vital signs in last 24 hours: Temp:  [97.9 F (36.6 C)-102.9 F (39.4 C)] 99.4 F (37.4 C) (03/07 1400) Pulse Rate:  [76-110] 85 (03/07 1400) Resp:  [19-33] 28 (03/07 1400) BP: (104-175)/(53-95) 130/78 (03/07 1400) SpO2:  [90 %-100 %] 93 % (03/07 1400)  General: lying in bed, NAD Neuro: Alert, oriented, follows commands, cranial nerves appear grossly intact, 5/5 in right upper and right lower extremity, 4/5 in left lower extremity, 3/5 in left upper extremity  Basic Metabolic Panel: Recent Labs  Lab 04/27/23 0432 04/28/23 1120 04/28/23 2124 04/29/23 0432 04/29/23 1652 04/30/23 0449 05/01/23 0559 05/02/23 0558  NA 140 138  --  137  --  137 136 135  K 3.6 4.2  --  4.0  --  4.4 4.0 3.9  CL 104 100  --  102  --  100 104 105  CO2 28 29  --  25  --  27 26 26   GLUCOSE 112* 105*  --  140*  --  150* 128* 131*  BUN 26* 18  --  22  --  25* 30* 29*  CREATININE 0.93 1.07  --  0.88  --  0.86 0.98 0.89  CALCIUM 8.0* 8.2*  --  8.1*  --  8.4* 7.7* 7.5*  MG 2.0 1.9 1.9 1.8 2.1 1.9 1.9 1.9  PHOS 2.9 3.2 2.7 2.6 2.4*  --   --   --     CBC: Recent Labs  Lab 04/26/23 0500 04/27/23 0432 04/28/23 0352 04/29/23 0432 04/30/23 0449 05/01/23 0559 05/02/23 0558  WBC 4.6 4.3 2.5* 3.6* 3.2* 4.0 3.4*  NEUTROABS 4.2 3.4  --   --   --   --   --   HGB 12.9* 12.4* 12.9* 13.3 12.2* 11.5* 11.5*  HCT 40.0 38.3* 40.3 40.1 37.4* 35.3* 34.1*  MCV 93.0 92.3 93.9 91.6 91.4 91.9 91.9  PLT 149* 140* 138* 139* 132* 129* 131*     Coagulation Studies: No results for input(s): "LABPROT", "INR" in the last 72 hours.  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   Epilepsy with breakthrough seizure Cerebral edema B-cell lymphoma -In the setting of underlying lymphoma and edema   Recommendations -Start phenobarbital 97mg  daily. Will check phenobarb levels -Increase perampanel to 4mg  QHS -Increase Vimpat to 200 mg twice daily -Continue Keppra 2000 mg twice daily -Continue video EEG to monitor for intermittent seizures -If any further seizures, can add topamax or gabapentin -Discussed plan with Dr. Everardo All via secure chat -Discussed plan in detail with patient's wife, daughter and son-in-law as well as oncology nurse practitioner on 05/01/2023.  Patient has a potentially curable cancer and has very good baseline quality of life.  He is supposed to get methotrexate to 2 weeks (next dose due next week).  So far he is tolerating his antiseizure medications without excessive sedation.  Therefore goal is to continue to escalate treatment and if needed family is okay with proceeding with intubation.  However if seizures are not fully controlled by next week, oncology team will try to administer  methotrexate while inpatient -Continue seizure precautions -As needed IV Ativan for clinical seizures   I have spent a total of  35 minutes with the patient reviewing hospital notes,  test results, labs and examining the patient as well as establishing an assessment and plan.  > 50% of time was spent in direct patient care.   Lindie Spruce Epilepsy [facility] For questions after 5pm please refer to AMION to reach the Neurologist on call

## 2023-05-02 NOTE — Progress Notes (Addendum)
 Reached out to Dr. Everardo All about, drowsiness, increased confusion and tachypnea (RR 20-30) in [place]. She instructed to contact Dr. Melynda Ripple to see if Pt has had an increase in seizures. Dr. Melynda Ripple stated that Jhovanny "still has seizures but less frequent than before." Dr. Everardo All made aware. No changes at this time.     1830Cloyd Stagers, PA at bedside to assess. CXR & VBG ordered. No other changes at this time.

## 2023-05-02 NOTE — Progress Notes (Signed)
 SLP Cancellation Note  Patient Details Name: Tyler [name] MRN: [id number] DOB: 17-Apr-1950   Cancelled treatment:       Reason Eval/Treat Not Completed: Medical issues which prohibited therapy. Plan was to repeat MBS today. Reached out to RN regarding readiness in light of active seizures. She discussed with MD who wants [place] to hold MBS at this time until seizures are more controlled. Note that CXR from previous date is without active disease. Will f/u to schedule MBS as medically appropriate.    Mahala Menghini., M.A. CCC-SLP Acute Rehabilitation Services Office [phone]  Secure chat preferred  05/02/2023, 9:13 AM

## 2023-05-02 NOTE — Procedures (Addendum)
 Patient Name: Tyler [name]  MRN: [id number]  Epilepsy Attending: Charlsie Quest  Referring Physician/Provider: Gordy Councilman, MD  Duration: 05/01/2023 1608 to 05/02/2023 1608   Patient history: 73yo M patient with 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 EEG to evaluate for seizure    Level of alertness: Awake, asleep   AEDs during EEG study: LEV, LCM, Perampanel, Phenobarb   Technical aspects: This EEG study was done with scalp electrodes positioned according to the 10-20 International system of electrode placement. Electrical activity was reviewed with band pass filter of 1-70Hz , sensitivity of 7 uV/mm, display speed of 29mm/sec with a 60Hz  notched filter applied as appropriate. EEG data were recorded continuously and digitally stored.  Video monitoring was available and reviewed as appropriate.   Description: No clear posterior dominant rhythm was seen.  Sleep was characterized by sleep spindles (12 to 40 Hz), maximal frontocentral region.  EEG showed continuous generalized and maximal right parietal 3 to 6 Hz theta-delta slowing. Lateralized periodic discharges were noted in right hemisphere, maximal right parietal region with fluctuating frequency of 1 to 2.5 Hz , at times with overlying rhythmicity. Hyperventilation and photic stimulation were not performed.      Patient was noted to have episodes of left-sided facial twitching.  Concomitant EEG showed lateralized periodic discharges with overlying rhythmicity in right hemisphere, maximal right parietal region which evolved in frequency from 5 Hz to 2 to 3 Hz and appeared more sharply contoured.  Average 4 seizures were noted per hour lasting about 1 minute each.   ABNORMALITY -Focal seizure, right parietal region -Lateralized periodic discharges with overlying rhythmicity, right hemisphere, maximal right parietal  region ( LPD+R) -Continuous slow, generalized and maximal right parietal region   IMPRESSION: This study  showed focal motor seizures during which patient had left facial twitching arising from right parietal region.  Average 4 seizures were noted per hour, lasting about 1 minute each.   Additionally there is evidence of epileptogenicity arising from right hemisphere, maximal right parietal region which is on the ictal-interictal continuum with higher likelihood of ictal nature due to the overlying rhythmicity and frequency reaching up to 2.5 Hz.     Lastly there is cortical dysfunction in right hemisphere, maximal right parietal region likely secondary to underlying structural abnormality as well as moderate diffuse encephalopathy.     Vishaal Strollo [name]

## 2023-05-02 NOTE — Progress Notes (Signed)
 NAME:  Tyler [name], MRN:  [id number], DOB:  1951-01-12, LOS: 10 ADMISSION DATE:  04/22/2023, CONSULTATION/ SERVICE  DATE:  04/25/23 REFERRING MD:  Dr. Candise Che, CHIEF COMPLAINT: Neck swelling, drooling, difficulty swallowing, altered mental status with concern for partial seizures  History of Present Illness:  Mr. Trim is a 73 year old gentleman with significant history of CNS relapse of large B-cell lymphoma, partial seizure who was admitted to cardiology service on 2/25 for second cycle of high-dose methotrexate with leucovorin rescue.  Patient also has prior history of paroxysmal atrial fibrillation not on anticoagulation due to bleeding risk, prostate cancer status post prostatectomy in 2021, pulmonary embolism, hepatitis B exposure.  Patient had high-grade large B-cell lymphoma and completed 6 cycles of R-CHOP with systemic remission in the past.  As per the primary oncologist, patient was doing well and receiving his medication as planned.  He was started on bicarb drip to minimize the methotrexate related toxicity.  However, this evening, oncologist noted increased neck swelling as well as patient had difficulty swallowing.  He was also noted to have drooling of saliva and developed altered mental status with facial twitching concerning for seizure activity.  There was concern about patient's ability to protect airway and possible angioedema (neck swelling), ED was consulted and patient was intubated emergently.  At the time of my evaluation, patient was intubated and unresponsive.  No family member at the bedside.  Rest of the information is as per the chart review and I was unable to independently verify with the patient.   PAST    has a past medical history of Cancer (HCC), Follicular lymphoma (HCC) (2024), History of pulmonary embolism, Hypertension, Myelodysplastic syndrome (HCC), PAF (paroxysmal atrial fibrillation) (HCC), and Recurrent UTI.   has a past surgical history that includes  Robot assisted laparoscopic radical prostatectomy (N/A, 09/01/2019); Lymphadenectomy (Bilateral, 09/01/2019); Esophagogastroduodenoscopy (egd) with propofol (N/A, 08/21/2022); biopsy (08/21/2022); IR IMAGING GUIDED PORT INSERTION (08/26/2022); Craniotomy (Right, 03/10/2023); and Application of cranial navigation (Right, 03/10/2023).   Significant events   04/22/2023 - ADMIT  2/27  - MRI brain - Marked interval decrease in size of right parietal mass/lymphoma, now measuring 2.9 x 3.5 x 2.3 cm (previously 5.4 x 3.6 x 4.4 cm), consistent with interval response to therapy. Persistent but improved vasogenic edema within the adjacent posterior right cerebral hemisphere, with interval resolution of previously seen right-to-left midline shift. 2. 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 Correlation with symptomatology and EEG recommended. Evolving changes of subacute ischemia would be the primary differential consideration, but are felt to be less likely.  2/28 - Transferred to ICU and intubated. Witnessed by Dr Candise Che with have left neck swelling, gurgling, and twitching left side -> then partially respnsvive -> ativan/confused -> EDP intubated. Around 7.30-8pm  04/26/23: Coretrack placed early hours .Intubated early hours.  On the ventilator FiO2 50%.  Low-grade fever since 04/17/2023 but normal white count. Ceribell EEG oongoing. Prmary language for aptient is Swahili. SBP HIGH with HR 40s - on scheduled lopressor . Ceribel shows    ceribell EEG is is suggestive of severe diffuse encephalopathy. No seizures were seen throughout the recording.   - Blood clture  - Tracheal  aspirate  3/2 Extubated. Remains on PANDA. Scheduled for barium today. Remains on cEEG per Neuro  3/3-3/7 Continues to have focal seizures refractory to increased AED use. Family meeting held with agreement to continue to escalate  treatment and proceed with  intubation if needed.   SUBJECTIVE/OVERNIGHT/INTERVAL HX   Continues to have focal seizures refractory to increased AED use. Family meeting yesterday held with agreement to continue to escalate treatment and proceed with intubation if needed.  Objective   Blood pressure (!) 147/89, pulse 92, temperature 97.9 F (36.6 C), temperature source Axillary, resp. rate (!) 30, height 5\' 7"  (1.702 m), weight 90.1 kg, SpO2 94%.        Intake/Output Summary (Last 24 hours) at 05/02/2023 1009 Last data filed at 05/02/2023 0900 Gross per 24 hour  Intake 1471 ml  Output 950 ml  Net 521 ml   Filed Weights   04/28/23 0500 04/29/23 0450 04/30/23 0500  Weight: 89.2 kg 89.1 kg 90.1 kg   Physical Exam: General: Chronically ill-appearing, no acute distress HENT: [place], AT, OP clear, MMM Eyes: EOMI, no scleral icterus Respiratory: Clear to auscultation bilaterally.  No crackles, wheezing or rales Cardiovascular: RRR, -M/R/G, no JVD GI: BS+, soft, nontender Extremities:-Edema,-tenderness Neuro: AAO x2, CNII-XII grossly intact  Imaging, labs and test noted above have been reviewed independently by me. WBC 3.4 Hg stable 11.5 CXR neg   Assessment & Plan:   Partial sensory seizures, left side - improved facial twitching clinically Acute encephalopathy  -on 04/25/2203 felt due to CNS lymphoma with edema -04/25/2023: Morning reports of relatively normal mental status but at night sudden worsening with obtundation and potential seizures and intubated. MRI Mass improvd but 2/27 - New diffusion signal abnormality involving the right insular cortex and overlying right frontal lobe, favored to reflect sequelae of seizure -3/2 - doing wake up exam without seizures P:  Neurology following. Managing AEDs. Keppra and Vimpat increased again 3/4. 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 S/p chemotherapy R-CHOP.  S/p craniotomy with gross total resection 03/10/23.  - CT of head 04/07/2023 showed interval increase in size of tumor at the craniotomy site. MRI of head 04/07/2023 increase in size of tumor with slightly increased surrounding edema. MRI brain 2/27  - improvmeent - Currently on Hi-dose methotrexate chemotherapy regimen which is more aggressive approach.  Received cycle 1 on 04/09/23 and admitted for cycle 2 of HIGH DOSE Methotrexate chemotherapy regimen,  completed 04/23/2023. P: Oncology following  - Now methotrexate level < 0.1. Off sodium bicarb, off leucovorin. No longer monitoring urine PH --PO dexamethasone for brain edema - IV Rituxan as outpatient between 2nd and 3rd cycle of methotrexate and subsequently between treatment cycles. Plan for first week of March 2025 if discharged - can be delayed if needed> May start methotrexate while inpatient Restart home multivitamins including b complex and folic acid 3/5  Acute respiratory failure 04/25/2023 evening associated with neck swelling  (likely fat tissue chages with seizures) and seizures and  postictal state  - s/p Intubation .   - Had neck swelling at time bu CT neck is normal and exam 3/1 - is fat tissue P:   Extubated 3/2 Passed barium however impulsive/choking. Tolerating full liquid diet Wean supplemental O2 for goal >90% Aspiration precautions Continue to monitor airway in setting of increased AED administration  Hypertensive and Bradycardic on lopressor 04/26/23 Normal echo 04/26/23 P:  DC lopressor due to bradycardia 3/2 Started scheduled hydralazine 3/3 Tele  Hep B antibody positive, chronic P: On tenofovir for hep B reactivation suppression in context of Rituxan use. Plan is to continue for 6 months after his last chemotherapy. Follow  ID outpatient  Intermittent fever  Likely secondary to seizure. No source of infection. RVP neg 3/2- current P: Monitor fever curve/WBC  off antibiotics CXR normal  Mild anemia of critical illness onset less than 13 g% 04/26/2023 P:  Trend Transfuse if needed for Hg goal >7  Mild thrombocytopenia - mild P: Lovenox 40 mg every 24 hours    At risk for hypo and hyperglycemia P:   SSI Tube feeds   Best practice:  Diet: Full liquid diet Pain/Anxiety/Delirium protocol (if indicated): n/a VAP protocol (if indicated): n/a DVT prophylaxis: lovnox GI prophylaxis: Home PPI Glucose control: ssi Mobility: bed rest Code Status: full Family Communication:   3/1 - Daughter Sherron Flemings - > [phone] => updated her over phone. Also d/w Dr Candise Che  3/2 - Daughter Sherron Flemings -> upated over phone.  Explained need to go to [facility] 04/27/23 for c-EEG  3/3 - Updated daughter at bedside  3/4 Updated daughter by phone   ATTESTATION & SIGNATURE   The patient is critically ill with multiple organ systems failure and requires high complexity decision making for assessment and support, frequent evaluation and titration of therapies, application of advanced monitoring technologies and extensive interpretation of multiple databases.  Independent Critical Care Time: 35 Minutes.   Mechele Collin, M.D. [facility] Pulmonary/Critical Care Medicine 05/02/2023 10:09 AM   Please see Amion for pager number to reach on-call Pulmonary and Critical Care Team.

## 2023-05-02 NOTE — Progress Notes (Addendum)
  Inpatient Rehabilitation Admissions Coordinator   Met with patient at bedside. No family present. Patient not medically at a level to pursue CIR admit. I attempted to call daughter, voicemail is full. I will follow his progress and contact family when appropriate.  Ottie Glazier, RN, MSN Rehab Admissions Coordinator [phone] 05/02/2023 10:52 AM   I spoke with patient's 's daughter by phone and she will call me back when she is available to discuss his eventual rehab needs.  Ottie Glazier, RN, MSN Rehab Admissions Coordinator [phone] 05/02/2023 11:11 AM

## 2023-05-02 NOTE — Progress Notes (Signed)
 Nutrition Follow-up  DOCUMENTATION CODES:   Not applicable  INTERVENTION:   Resume continuous TF as pt struggling to tolerate POs during seizures per RN.   Tube feeding via Cortrak tube: Osmolite 1.5 @ 55 ml/hr (1320 ml per day)  Prosource TF20 60 ml BID  Provides 2140 kcal, 122 gm protein, 1008 ml free water daily   NUTRITION DIAGNOSIS:   Inadequate oral intake related to  (seizures) as evidenced by meal completion < 50%. Ongoing.   GOAL:   Patient will meet greater than or equal to 90% of their needs Met with TF at goal   MONITOR:   PO intake, TF tolerance  REASON FOR ASSESSMENT:   Consult Enteral/tube feeding initiation and management (trickles)  ASSESSMENT:   73 y.o. male with PMH prostate cancer s/p prostatectomy in 2021, pulmonary embolism, hepatitis B exposure, and high-grade large B-cell lymphoma and completed 6 cycles of R-CHOP (chemo) with systemic remission in the past. 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  Pt discussed during ICU rounds and with RN and NP.  Per RN pt is not tolerating POs today due to seizures. Will resume continuous TF.   2/25 - admitted  2/28 - intubated 3/1 - s/p cortrak placement (at [facility]); tip gastric per xray 3/2 - extubated  Medications reviewed and include: Vitamin D3 2000 IU daily, Vitamin B12 1000 mcg daily, decadron, Ensure Enlive BID, folic acids, SSI every 4 hours, protonix, thiamine   Labs reviewed:  CBG's: 93-120   Diet Order:   Diet Order             DIET - DYS 1 Room service appropriate? Yes; Fluid consistency: Thin  Diet effective now                   EDUCATION NEEDS:   Not appropriate for education at this time  Skin:  Skin Assessment: Reviewed RN Assessment  Last BM:  3/4  Height:   Ht Readings from Last 1 Encounters:  04/23/23 5\' 7"  (1.702 m)    Weight:   Wt Readings from Last 1  Encounters:  04/30/23 90.1 kg    Ideal Body Weight:  67.27 kg  BMI:  Body mass index is 31.11 kg/m.  Estimated Nutritional Needs:   Kcal:  2100-2350 kcals  Protein:  115-125g  Fluid:  >/= 2.1L  Lena Fieldhouse P., RD, LDN, CNSC See AMiON for contact information

## 2023-05-02 NOTE — TOC Initial Note (Signed)
 Transition of Care [facility]) - Initial/Assessment Note    Patient Details  Name: Tyler [name] MRN: [id number] Date of Birth: 05/31/1950  Transition of Care [facility]) CM/SW Contact:    Mearl Latin, LCSW Phone Number: 05/02/2023, 10:11 AM  Clinical Narrative:                 Patient transferred to [facility]. TOC following for needs; CIR recommended.   Expected Discharge Plan: IP Rehab Facility Barriers to Discharge: Continued Medical Work up, English as a second language teacher   Patient Goals and CMS Choice       [place] ownership interest in [facility].provided to:: Adult Children    Expected Discharge Plan and Services In-house Referral: Clinical Social Work   Post Acute Care Choice: IP Rehab Living arrangements for the past 2 months: Single Family Home                                      Prior Living Arrangements/Services Living arrangements for the past 2 months: Single Family Home Lives with:: Spouse Patient language and need for interpreter reviewed:: Yes Do you feel safe going back to the place where you live?: Yes      Need for Family Participation in Patient Care: Yes (Comment) Care giver support system in place?: Yes (comment)   Criminal Activity/Legal Involvement Pertinent to Current Situation/Hospitalization: No - Comment as needed  Activities of Daily Living   ADL Screening (condition at time of admission) Independently performs ADLs?: Yes (appropriate for developmental age) Does the patient have a NEW difficulty with bathing/dressing/toileting/self-feeding that is expected to last >3 days?: No Does the patient have a NEW difficulty with getting in/out of bed, walking, or climbing stairs that is expected to last >3 days?: No Does the patient have a NEW difficulty with communication that is expected to last >3 days?: No Is the patient deaf or have difficulty hearing?: No Does the patient have difficulty seeing, even when wearing glasses/contacts?: No Does  the patient have difficulty concentrating, remembering, or making decisions?: No  Permission Sought/Granted Permission sought to share information with : Facility Medical sales representative                Emotional Assessment Appearance:: Appears stated age Attitude/Demeanor/Rapport: Unable to Assess Affect (typically observed): Unable to Assess Orientation: : Oriented to Self, Oriented to Situation Alcohol / Substance Use: Not Applicable Psych Involvement: No (comment)  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/2022   Acute renal failure with tubular necrosis (HCC) 08/08/2022  Aspiration into airway 08/08/2022   At high risk of tumor lysis syndrome 08/07/2022   Anemia 08/06/2022   Diffuse large B-cell lymphoma of lymph nodes of multiple regions (HCC) 07/25/2022   Encounter for antineoplastic chemotherapy 07/25/2022   Lumbar radiculopathy 05/31/2022   Symptomatic anemia    UTI (urinary tract infection) 12/10/2021   Septic shock (HCC) 12/10/2021    Normocytic anemia 12/10/2021   Thrombocytopenia (HCC) 12/10/2021   Hyponatremia 12/10/2021   Sepsis secondary to UTI (HCC) 12/10/2021   Pelvic hematoma in male 09/08/2019   Bilateral pulmonary embolism (HCC) 09/05/2019   Paroxysmal atrial fibrillation (HCC) 09/05/2019   Hypertension    Hypokalemia    Prostate cancer (HCC) 09/01/2019   PCP:  Nechama Guard, FNP Pharmacy:   [phone] [place], [place] - [address] AT [facility] [address] [place] [address] Phone: [phone] Fax: [phone]  [facility] [address] [place] [address] Phone: [phone] Fax: [phone]  [facility] [place] [place] - [address] [place] [address] Phone: [phone] Fax: [phone]     Social Drivers of Health (SDOH) Social History: SDOH Screenings   Food Insecurity: No Food Insecurity (04/22/2023)  Housing: Low Risk  (04/22/2023)  Transportation Needs: No Transportation Needs (04/22/2023)  Utilities: Not At Risk (04/22/2023)  Financial Resource Strain: Not on File (06/14/2021)   Received from [place], [place]  Physical Activity: Not on File (06/14/2021)   Received from [place], [place]  Social Connections: Patient Declined (04/22/2023)  Recent Concern: Social Connections - Moderately Isolated (04/07/2023)  Stress: Not on File (06/14/2021)   Received from [place], [place]  Tobacco Use: Low Risk  (04/22/2023)   SDOH Interventions:     Readmission Risk Interventions    04/23/2023   12:41 PM 03/07/2023    2:25 PM 08/22/2022    3:06 PM  Readmission Risk Prevention Plan  Transportation Screening Complete Complete Complete  Home Care Screening  Complete   Medication Review (RN CM)  Referral to Pharmacy   Medication Review (RN Care Manager) Complete  Complete  PCP or Specialist appointment within 3-5 days of discharge Complete  Complete  HRI or Home Care  Consult Complete  Complete  SW Recovery Care/Counseling Consult Complete  Complete  Palliative Care Screening Not Applicable  Complete  Skilled Nursing Facility Not Applicable  Complete

## 2023-05-02 NOTE — Progress Notes (Signed)
 PCCM Interval Progress Note:  Contacted by bedside RN, Thea Silversmith re: tachypnea and decreased mental status. On bedside evaluation, patient is more somnolent than earlier today; he will open eyes to voice and persistent verbal stimulation but is not reliably following commands or answering questions. He is mildly tachypneic and taking more shallow breaths than prior; SpO2 remains acceptable but patient has some loud upper airway noises consistent with secretions. Suctioned with [place] with some improvement, recommend NTS. I have ordered CXR and VBG to r/o worsening aspiration or hypercapnia as components of mental status changes.  Tim Lair, PA-C [facility] 05/02/23 6:41 PM  Please see [email or web address] for pager details.  From 7A-7P if no response, please call [phone] After hours, please call ELink [phone]

## 2023-05-03 ENCOUNTER — Inpatient Hospital Stay (HOSPITAL_COMMUNITY)

## 2023-05-03 DIAGNOSIS — R4182 Altered mental status, unspecified: Secondary | ICD-10-CM

## 2023-05-03 DIAGNOSIS — G40109 Localization-related (focal) (partial) symptomatic epilepsy and epileptic syndromes with simple partial seizures, not intractable, without status epilepticus: Secondary | ICD-10-CM | POA: Diagnosis not present

## 2023-05-03 DIAGNOSIS — I10 Essential (primary) hypertension: Secondary | ICD-10-CM | POA: Diagnosis not present

## 2023-05-03 DIAGNOSIS — G5139 Clonic hemifacial spasm, unspecified: Secondary | ICD-10-CM | POA: Diagnosis not present

## 2023-05-03 DIAGNOSIS — G40909 Epilepsy, unspecified, not intractable, without status epilepticus: Secondary | ICD-10-CM | POA: Diagnosis not present

## 2023-05-03 DIAGNOSIS — C8339 Primary central nervous system lymphoma: Secondary | ICD-10-CM | POA: Diagnosis not present

## 2023-05-03 DIAGNOSIS — J9601 Acute respiratory failure with hypoxia: Secondary | ICD-10-CM | POA: Diagnosis not present

## 2023-05-03 LAB — CBC
HCT: 36.5 % — ABNORMAL LOW (ref 39.0–52.0)
Hemoglobin: 11.9 g/dL — ABNORMAL LOW (ref 13.0–17.0)
MCH: 29.5 pg (ref 26.0–34.0)
MCHC: 32.6 g/dL (ref 30.0–36.0)
MCV: 90.3 fL (ref 80.0–100.0)
Platelets: 131 10*3/uL — ABNORMAL LOW (ref 150–400)
RBC: 4.04 MIL/uL — ABNORMAL LOW (ref 4.22–5.81)
RDW: 14 % (ref 11.5–15.5)
WBC: 2.5 10*3/uL — ABNORMAL LOW (ref 4.0–10.5)
nRBC: 0 % (ref 0.0–0.2)

## 2023-05-03 LAB — POCT I-STAT 7, (LYTES, BLD GAS, ICA,H+H)
Acid-Base Excess: 1 mmol/L (ref 0.0–2.0)
Bicarbonate: 23.9 mmol/L (ref 20.0–28.0)
Calcium, Ion: 1.14 mmol/L — ABNORMAL LOW (ref 1.15–1.40)
HCT: 30 % — ABNORMAL LOW (ref 39.0–52.0)
Hemoglobin: 10.2 g/dL — ABNORMAL LOW (ref 13.0–17.0)
O2 Saturation: 100 %
Patient temperature: 100
Potassium: 4 mmol/L (ref 3.5–5.1)
Sodium: 134 mmol/L — ABNORMAL LOW (ref 135–145)
TCO2: 25 mmol/L (ref 22–32)
pCO2 arterial: 33.7 mmHg (ref 32–48)
pH, Arterial: 7.463 — ABNORMAL HIGH (ref 7.35–7.45)
pO2, Arterial: 368 mmHg — ABNORMAL HIGH (ref 83–108)

## 2023-05-03 LAB — GLUCOSE, CAPILLARY
Glucose-Capillary: 110 mg/dL — ABNORMAL HIGH (ref 70–99)
Glucose-Capillary: 121 mg/dL — ABNORMAL HIGH (ref 70–99)
Glucose-Capillary: 135 mg/dL — ABNORMAL HIGH (ref 70–99)
Glucose-Capillary: 137 mg/dL — ABNORMAL HIGH (ref 70–99)
Glucose-Capillary: 184 mg/dL — ABNORMAL HIGH (ref 70–99)

## 2023-05-03 LAB — BASIC METABOLIC PANEL
Anion gap: 9 (ref 5–15)
BUN: 27 mg/dL — ABNORMAL HIGH (ref 8–23)
CO2: 25 mmol/L (ref 22–32)
Calcium: 8 mg/dL — ABNORMAL LOW (ref 8.9–10.3)
Chloride: 99 mmol/L (ref 98–111)
Creatinine, Ser: 0.86 mg/dL (ref 0.61–1.24)
GFR, Estimated: >60 mL/min (ref >60)
Glucose, Bld: 127 mg/dL — ABNORMAL HIGH (ref 70–99)
Potassium: 4 mmol/L (ref 3.5–5.1)
Sodium: 133 mmol/L — ABNORMAL LOW (ref 135–145)

## 2023-05-03 LAB — PHENOBARBITAL LEVEL: Phenobarbital: 5.2 ug/mL — ABNORMAL LOW (ref 15.0–40.0)

## 2023-05-03 LAB — MAGNESIUM: Magnesium: 1.9 mg/dL (ref 1.7–2.4)

## 2023-05-03 MED ORDER — LACTATED RINGERS IV BOLUS
1000.0000 mL | Freq: Once | INTRAVENOUS | Status: AC
  Administered 2023-05-03: 1000 mL via INTRAVENOUS

## 2023-05-03 MED ORDER — ORAL CARE MOUTH RINSE
15.0000 mL | OROMUCOSAL | Status: DC
  Administered 2023-05-03 – 2023-05-09 (×74): 15 mL via OROMUCOSAL

## 2023-05-03 MED ORDER — SODIUM CHLORIDE 0.9 % IV SOLN
250.0000 mL | INTRAVENOUS | Status: AC
  Administered 2023-05-03: 250 mL via INTRAVENOUS

## 2023-05-03 MED ORDER — INSULIN ASPART 100 UNIT/ML IJ SOLN
0.0000 [IU] | INTRAMUSCULAR | Status: DC
  Administered 2023-05-03: 2 [IU] via SUBCUTANEOUS
  Administered 2023-05-03: 3 [IU] via SUBCUTANEOUS
  Administered 2023-05-04: 2 [IU] via SUBCUTANEOUS
  Administered 2023-05-04 (×2): 3 [IU] via SUBCUTANEOUS
  Administered 2023-05-04 – 2023-05-05 (×3): 2 [IU] via SUBCUTANEOUS
  Administered 2023-05-05: 3 [IU] via SUBCUTANEOUS
  Administered 2023-05-05 – 2023-05-06 (×3): 2 [IU] via SUBCUTANEOUS
  Administered 2023-05-07: 3 [IU] via SUBCUTANEOUS
  Administered 2023-05-07 (×2): 2 [IU] via SUBCUTANEOUS
  Administered 2023-05-08: 3 [IU] via SUBCUTANEOUS
  Administered 2023-05-08 – 2023-05-10 (×8): 2 [IU] via SUBCUTANEOUS
  Administered 2023-05-11: 3 [IU] via SUBCUTANEOUS
  Administered 2023-05-11 – 2023-05-12 (×3): 2 [IU] via SUBCUTANEOUS
  Administered 2023-05-13 (×2): 3 [IU] via SUBCUTANEOUS
  Administered 2023-05-13 – 2023-05-17 (×13): 2 [IU] via SUBCUTANEOUS
  Administered 2023-05-17: 3 [IU] via SUBCUTANEOUS
  Administered 2023-05-17 – 2023-05-18 (×4): 2 [IU] via SUBCUTANEOUS
  Administered 2023-05-18: 3 [IU] via SUBCUTANEOUS
  Administered 2023-05-19 – 2023-05-22 (×7): 2 [IU] via SUBCUTANEOUS
  Administered 2023-05-22: 3 [IU] via SUBCUTANEOUS
  Administered 2023-05-22 (×2): 2 [IU] via SUBCUTANEOUS
  Administered 2023-05-23 (×2): 3 [IU] via SUBCUTANEOUS

## 2023-05-03 MED ORDER — FENTANYL CITRATE PF 50 MCG/ML IJ SOSY
50.0000 ug | PREFILLED_SYRINGE | Freq: Once | INTRAMUSCULAR | Status: AC
  Administered 2023-05-03: 50 ug via INTRAVENOUS

## 2023-05-03 MED ORDER — KETAMINE HCL 50 MG/5ML IJ SOSY
PREFILLED_SYRINGE | INTRAMUSCULAR | Status: AC
  Filled 2023-05-03: qty 10

## 2023-05-03 MED ORDER — PERAMPANEL 2 MG PO TABS
4.0000 mg | ORAL_TABLET | Freq: Once | ORAL | Status: AC
  Administered 2023-05-03: 4 mg
  Filled 2023-05-03: qty 2

## 2023-05-03 MED ORDER — ETOMIDATE 2 MG/ML IV SOLN
INTRAVENOUS | Status: AC
  Filled 2023-05-03: qty 20

## 2023-05-03 MED ORDER — SUCCINYLCHOLINE CHLORIDE 200 MG/10ML IV SOSY
PREFILLED_SYRINGE | INTRAVENOUS | Status: AC
  Filled 2023-05-03: qty 10

## 2023-05-03 MED ORDER — LORAZEPAM 2 MG/ML IJ SOLN
2.0000 mg | Freq: Once | INTRAMUSCULAR | Status: AC
  Administered 2023-05-03: 2 mg via INTRAVENOUS

## 2023-05-03 MED ORDER — PERAMPANEL 2 MG PO TABS
6.0000 mg | ORAL_TABLET | Freq: Every day | ORAL | Status: DC
  Administered 2023-05-03 – 2023-05-05 (×3): 6 mg
  Filled 2023-05-03 (×3): qty 3

## 2023-05-03 MED ORDER — NOREPINEPHRINE 4 MG/250ML-% IV SOLN
2.0000 ug/min | INTRAVENOUS | Status: DC
  Administered 2023-05-03: 2 ug/min via INTRAVENOUS
  Filled 2023-05-03: qty 250

## 2023-05-03 MED ORDER — POLYETHYLENE GLYCOL 3350 17 G PO PACK
17.0000 g | PACK | Freq: Every day | ORAL | Status: DC
  Administered 2023-05-04 – 2023-05-08 (×5): 17 g
  Filled 2023-05-03 (×5): qty 1

## 2023-05-03 MED ORDER — FENTANYL CITRATE PF 50 MCG/ML IJ SOSY
PREFILLED_SYRINGE | INTRAMUSCULAR | Status: AC
Start: 2023-05-03 — End: 2023-05-03
  Filled 2023-05-03: qty 2

## 2023-05-03 MED ORDER — MIDAZOLAM HCL 2 MG/2ML IJ SOLN
2.0000 mg | Freq: Once | INTRAMUSCULAR | Status: AC
  Administered 2023-05-03: 1 mg via INTRAVENOUS

## 2023-05-03 MED ORDER — DOCUSATE SODIUM 50 MG/5ML PO LIQD
100.0000 mg | Freq: Two times a day (BID) | ORAL | Status: DC
Start: 2023-05-03 — End: 2023-05-09
  Administered 2023-05-03 – 2023-05-09 (×11): 100 mg
  Filled 2023-05-03 (×11): qty 10

## 2023-05-03 MED ORDER — DEXMEDETOMIDINE HCL IN NACL 400 MCG/100ML IV SOLN
0.0000 ug/kg/h | INTRAVENOUS | Status: DC
  Administered 2023-05-03: 0.4 ug/kg/h via INTRAVENOUS
  Administered 2023-05-03: 0.6 ug/kg/h via INTRAVENOUS
  Administered 2023-05-03: 0.3 ug/kg/h via INTRAVENOUS
  Administered 2023-05-04 (×3): 0.5 ug/kg/h via INTRAVENOUS
  Administered 2023-05-05: 0.6 ug/kg/h via INTRAVENOUS
  Administered 2023-05-06: 0.4 ug/kg/h via INTRAVENOUS
  Administered 2023-05-07: 0.2 ug/kg/h via INTRAVENOUS
  Administered 2023-05-08: 0.6 ug/kg/h via INTRAVENOUS
  Administered 2023-05-08: 0.5 ug/kg/h via INTRAVENOUS
  Administered 2023-05-08: 0.6 ug/kg/h via INTRAVENOUS
  Filled 2023-05-03 (×5): qty 100
  Filled 2023-05-03: qty 200
  Filled 2023-05-03 (×2): qty 100
  Filled 2023-05-03: qty 200
  Filled 2023-05-03 (×3): qty 100

## 2023-05-03 MED ORDER — MIDAZOLAM HCL 2 MG/2ML IJ SOLN
INTRAMUSCULAR | Status: AC
  Filled 2023-05-03: qty 2

## 2023-05-03 MED ORDER — SODIUM CHLORIDE 0.9 % IV SOLN
450.0000 mg | Freq: Once | INTRAVENOUS | Status: AC
  Administered 2023-05-03: 450 mg via INTRAVENOUS
  Filled 2023-05-03: qty 3.46

## 2023-05-03 MED ORDER — ROCURONIUM BROMIDE 10 MG/ML (PF) SYRINGE
100.0000 mg | PREFILLED_SYRINGE | Freq: Once | INTRAVENOUS | Status: AC
  Administered 2023-05-03: 100 mg via INTRAVENOUS

## 2023-05-03 MED ORDER — FAMOTIDINE 20 MG PO TABS
20.0000 mg | ORAL_TABLET | Freq: Two times a day (BID) | ORAL | Status: DC
  Administered 2023-05-03 (×2): 20 mg
  Filled 2023-05-03 (×2): qty 1

## 2023-05-03 MED ORDER — DEXMEDETOMIDINE HCL IN NACL 400 MCG/100ML IV SOLN
INTRAVENOUS | Status: AC
  Administered 2023-05-03: 0.4 ug/kg/h via INTRAVENOUS
  Filled 2023-05-03: qty 100

## 2023-05-03 MED ORDER — ORAL CARE MOUTH RINSE: 15.0000 mL | OROMUCOSAL | Status: DC | PRN

## 2023-05-03 MED ORDER — ETOMIDATE 2 MG/ML IV SOLN
20.0000 mg | Freq: Once | INTRAVENOUS | Status: AC
  Administered 2023-05-03: 20 mg via INTRAVENOUS

## 2023-05-03 MED ORDER — ROCURONIUM BROMIDE 10 MG/ML (PF) SYRINGE
PREFILLED_SYRINGE | INTRAVENOUS | Status: AC
  Filled 2023-05-03: qty 10

## 2023-05-03 NOTE — Progress Notes (Signed)
 LTM maint complete - no skin breakdown under:  Fp1 F7 A1  Serviced several leads and serviced ecg lead [facility] monitored, Event button test confirmed by [facility].

## 2023-05-03 NOTE — Progress Notes (Signed)
 NAME:  Tyler [name], MRN:  [id number], DOB:  [birth year], LOS: 11 ADMISSION DATE:  04/22/2023, CONSULTATION/ SERVICE  DATE:  04/25/23 REFERRING MD:  Dr. Candise [name], CHIEF COMPLAINT: Neck swelling, drooling, difficulty swallowing, altered mental status with concern for partial seizures  History of Present Illness:  Tyler [name] is a 73 year old gentleman with significant history of CNS relapse of large B-cell lymphoma, partial seizure who was admitted to cardiology service on 2/25 for second cycle of high-dose methotrexate with leucovorin rescue.  Patient also has prior history of paroxysmal atrial fibrillation not on anticoagulation due to bleeding risk, prostate cancer status post prostatectomy in 2021, pulmonary embolism, hepatitis B exposure.  Patient had high-grade large B-cell lymphoma and completed 6 cycles of R-CHOP with systemic remission in the past.  As per the primary oncologist, patient was doing well and receiving his medication as planned.  He was started on bicarb drip to minimize the methotrexate related toxicity.  However, this evening, oncologist noted increased neck swelling as well as patient had difficulty swallowing.  He was also noted to have drooling of saliva and developed altered mental status with facial twitching concerning for seizure activity.  There was concern about patient's ability to protect airway and possible angioedema (neck swelling), ED was consulted and patient was intubated emergently.  At the time of my evaluation, patient was intubated and unresponsive.  No family member at the bedside.  Rest of the information is as per the chart review and I was unable to independently verify with the patient.   PAST    has a past medical history of Cancer (HCC), Follicular lymphoma (HCC) (2024), History of pulmonary embolism, Hypertension, Myelodysplastic syndrome (HCC), PAF (paroxysmal atrial fibrillation) (HCC), and Recurrent UTI.   has a past surgical history that includes  Robot assisted laparoscopic radical prostatectomy (N/A, 09/01/2019); Lymphadenectomy (Bilateral, 09/01/2019); Esophagogastroduodenoscopy (egd) with propofol (N/A, 08/21/2022); biopsy (08/21/2022); IR IMAGING GUIDED PORT INSERTION (08/26/2022); Craniotomy (Right, 03/10/2023); and Application of cranial navigation (Right, 03/10/2023).   Significant events   04/22/2023 - ADMIT  2/27  - MRI brain - Marked interval decrease in size of right parietal mass/lymphoma, now measuring 2.9 x 3.5 x 2.3 cm (previously 5.4 x 3.6 x 4.4 cm), consistent with interval response to therapy. Persistent but improved vasogenic edema within the adjacent posterior right cerebral hemisphere, with interval resolution of previously seen right-to-left midline shift. 2. 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 Correlation with symptomatology and EEG recommended. Evolving changes of subacute ischemia would be the primary differential consideration, but are felt to be less likely.  2/28 - Transferred to ICU and intubated. Witnessed by Dr Tyler [name] with have left neck swelling, gurgling, and twitching left side -> then partially respnsvive -> ativan/confused -> EDP intubated. Around 7.30-8pm  04/26/23: Coretrack placed early hours .Intubated early hours.  On the ventilator FiO2 50%.  Low-grade fever since 04/17/2023 but normal white count. Ceribell EEG oongoing. Prmary language for aptient is Swahili. SBP HIGH with HR 40s - on scheduled lopressor . Ceribel shows    ceribell EEG is is suggestive of severe diffuse encephalopathy. No seizures were seen throughout the recording.   - Blood clture  - Tracheal  aspirate  3/2 Extubated. Remains on PANDA. Scheduled for barium today. Remains on cEEG per Neuro  3/3-3/7 Continues to have focal seizures refractory to increased AED use. Family meeting held with agreement to continue to escalate  treatment and proceed with  intubation if needed.   SUBJECTIVE/OVERNIGHT/INTERVAL HX   Increased tachypnea with some rhonchi. CXR with no infiltrate. NTS ordered. At risk for aspiration Witnessed seizure overnight. Seen by Neuro.  This morning obtunded and unable to arouse. Unable to protect airway  Objective   Blood pressure (!) 138/103, pulse (!) 111, temperature 99.8 F (37.7 C), temperature source Axillary, resp. rate (!) 23, height 5\' 7"  (1.702 m), weight 90.1 kg, SpO2 98%.        Intake/Output Summary (Last 24 hours) at 05/03/2023 0710 Last data filed at 05/03/2023 0600 Gross per 24 hour  Intake 946.5 ml  Output 2000 ml  Net -1053.5 ml   Filed Weights   04/28/23 0500 04/29/23 0450 04/30/23 0500  Weight: 89.2 kg 89.1 kg 90.1 kg   Physical Exam: General: Chronically ill-appearing, lethargic, unable to arouse HENT: [place], AT, OP clear, MMM Eyes: EOMI, no scleral icterus Respiratory: Diminished breath sounds with scattered rhonchi bilaterally.  No crackles, wheezing or rales Cardiovascular: RRR, -M/R/G, no JVD GI: BS+, soft, nontender Extremities:-Edema,-tenderness Neuro: Lethargic, pupils pinpoint and minimally reactive, CNII-XII grossly intact  Imaging, labs and test noted above have been reviewed independently by me. WBC 2.5 Hg stable 11.9 CXR neg   Assessment & Plan:   Partial sensory seizures, left side - ongoing seizures Acute encephalopathy  -on 04/25/2203 felt due to CNS lymphoma with edema -04/25/2023: Morning reports of relatively normal mental status but at night sudden worsening with obtundation and potential seizures and intubated. MRI [place] improvd but 2/27 - New diffusion signal abnormality involving the right insular cortex and overlying right frontal lobe, favored to reflect sequelae of seizure -3/2 - doing wake up exam without seizures P:  Neurology following. Managing AEDs. Keppra and Vimpat increased again 3/4. Fycompa scheduled 3/6 Continuous EEG Neuro  checks  CNS relapse of large B-cell lymphoma, right parietal lobe Diffuse large B-cell lymphoma - Initially diagnosed May 2024. S/p chemotherapy R-CHOP.  S/p craniotomy with gross total resection 03/10/23.  - CT of head 04/07/2023 showed interval increase in size of tumor at the craniotomy site. MRI of head 04/07/2023 increase in size of tumor with slightly increased surrounding edema. MRI brain 2/27  - improvmeent - Currently on Hi-dose methotrexate chemotherapy regimen which is more aggressive approach.  Received cycle 1 on 04/09/23 and admitted for cycle 2 of HIGH DOSE Methotrexate chemotherapy regimen,  completed 04/23/2023. P: Oncology following  - Now methotrexate level < 0.1. Off sodium bicarb, off leucovorin. No longer monitoring urine PH --PO dexamethasone for brain edema - IV Rituxan as outpatient between 2nd and 3rd cycle of methotrexate and subsequently between treatment cycles. Plan for first week of March 2025 if discharged - can be delayed if needed> May start methotrexate while inpatient Restart home multivitamins including b complex and folic acid 3/5  Acute respiratory failure 04/25/2023 evening associated with neck swelling  (likely fat tissue chages with seizures) and seizures and  postictal state  - s/p Intubation .   - Had neck swelling at time bu CT neck is normal and exam 3/1 - is fat tissue P:   Extubated 3/2 Passed barium however impulsive/choking. Tolerating full liquid diet Wean supplemental O2 for goal >90% Aspiration precautions This am mental status worsened in setting of AEDs/seizure. Will intubate for airway protection. Daughter updated by telephone  Hypertensive and Bradycardic on lopressor 04/26/23 Normal echo 04/26/23 P:  DC lopressor due to bradycardia 3/2 Started scheduled hydralazine 3/3 Tele  Hep B antibody positive, chronic P: On tenofovir for  hep B reactivation suppression in context of Rituxan use. Plan is to continue for 6 months after his last  chemotherapy. Follow ID outpatient  Intermittent fever  Likely secondary to seizure. No source of infection. RVP neg 3/2- current P: Monitor fever curve/WBC off antibiotics CXR normal  Mild anemia of critical illness onset less than 13 g% 04/26/2023 P:  Trend Transfuse if needed for Hg goal >7  Mild thrombocytopenia - mild P: Lovenox 40 mg every 24 hours    At risk for hypo and hyperglycemia P:   SSI Tube feeds   Best practice:  Diet: NPO Pain/Anxiety/Delirium protocol (if indicated): n/a VAP protocol (if indicated): n/a DVT prophylaxis: lovnox GI prophylaxis: Home PPI Glucose control: ssi Mobility: bed rest Code Status: full Family Communication:   3/8 Updated daughter via telephone   ATTESTATION & SIGNATURE   The patient is critically ill with multiple organ systems failure and requires high complexity decision making for assessment and support, frequent evaluation and titration of therapies, application of advanced monitoring technologies and extensive interpretation of multiple databases.  Independent Critical Care Time: 36 Minutes.   Mechele Collin, M.D. [facility] Pulmonary/Critical Care Medicine 05/03/2023 8:44 AM   Please see Amion for pager number to reach on-call Pulmonary and Critical Care Team.

## 2023-05-03 NOTE — Progress Notes (Signed)
 Subjective: Continues to have intermittent seizure.  He had to be intubated for airway protection this morning.  ROS: negative except above  Examination  Vital signs in last 24 hours: Temp:  [98.8 F (37.1 C)-102.8 F (39.3 C)] 98.8 F (37.1 C) (03/08 1153) Pulse Rate:  [67-188] 75 (03/08 1300) Resp:  [20-40] 20 (03/08 1300) BP: (84-165)/(62-106) 90/67 (03/08 1300) SpO2:  [65 %-100 %] 97 % (03/08 1300) FiO2 (%):  [40 %-100 %] 40 % (03/08 1116)  General: lying in bed, NAD Neuro: Sedated, partially opens eyes to noxious stimulation, withdrawals in all four extremities to noxious stimulation.  Pupils are reactive bilaterally, does not blink to threat.   Basic Metabolic Panel: Recent Labs  Lab 04/27/23 0432 04/28/23 1120 04/28/23 2124 04/29/23 0432 04/29/23 1652 04/30/23 0449 05/01/23 0559 05/02/23 0558 05/03/23 0500 05/03/23 1118  NA 140 138  --  137  --  137 136 135 133* 134*  K 3.6 4.2  --  4.0  --  4.4 4.0 3.9 4.0 4.0  CL 104 100  --  102  --  100 104 105 99  --   CO2 28 29  --  25  --  27 26 26 25   --   GLUCOSE 112* 105*  --  140*  --  150* 128* 131* 127*  --   BUN 26* 18  --  22  --  25* 30* 29* 27*  --   CREATININE 0.93 1.07  --  0.88  --  0.86 0.98 0.89 0.86  --   CALCIUM 8.0* 8.2*  --  8.1*  --  8.4* 7.7* 7.5* 8.0*  --   MG 2.0 1.9 1.9 1.8 2.1 1.9 1.9 1.9 1.9  --   PHOS 2.9 3.2 2.7 2.6 2.4*  --   --   --   --   --     CBC: Recent Labs  Lab 04/27/23 0432 04/28/23 0352 04/29/23 0432 04/30/23 0449 05/01/23 0559 05/02/23 0558 05/03/23 0500 05/03/23 1118  WBC 4.3   < > 3.6* 3.2* 4.0 3.4* 2.5*  --   NEUTROABS 3.4  --   --   --   --   --   --   --   HGB 12.4*   < > 13.3 12.2* 11.5* 11.5* 11.9* 10.2*  HCT 38.3*   < > 40.1 37.4* 35.3* 34.1* 36.5* 30.0*  MCV 92.3   < > 91.6 91.4 91.9 91.9 90.3  --   PLT 140*   < > 139* 132* 129* 131* 131*  --    < > = values in this interval not displayed.     Coagulation Studies: No results for input(s): "LABPROT",  "INR" in the last 72 hours.  Imaging No new brain imaging overnight      ASSESSMENT AND PLAN:  73 year old male with history of A-fib not on anticoagulation due to bleeding risk, prostate cancer status post prostatectomy, PE, hep B exposure, CNS recent labs of large B-cell lymphoma, focal seizures on Keppra who was admitted for inpatient chemo and was noted to have left facial twitching consistent with focal seizures.  May need to add 1/5 agent, but I would favor optimizing his current agents first.   Epilepsy with breakthrough seizure Cerebral edema B-cell lymphoma -In the setting of underlying lymphoma and edema   Recommendations - phenobarbital 97mg  daily. Will give additional 5mg /kg given low levels.  - additional 4mg  perampanel x 1, increase to 6mg  qhs -Increase Vimpat to 200 mg twice  daily -Continue Keppra 2000 mg twice daily -Continue video EEG to monitor for intermittent seizures  This patient is critically ill and at significant risk of neurological worsening, death and care requires constant monitoring of vital signs, hemodynamics,respiratory and cardiac monitoring, neurological assessment, discussion with family, other specialists and medical decision making of high complexity. I spent 40 minutes of neurocritical care time  in the care of  this patient. This was time spent independent of any time provided by nurse practitioner or PA.  Ritta Slot, MD [facility]   If 7pm- 7am, please page neurology on call as listed in AMION. 05/03/2023  2:25 PM

## 2023-05-03 NOTE — Progress Notes (Signed)
   05/03/23 0226  Provider Notification  Provider Name/Title Dr Otelia Limes and PCCM MD  Date Provider Notified 05/03/23  Time Provider Notified [phone]  Method of Notification Page (For CCM via [facility] RN Sheria Lang)  Notification Reason Change in status (Seizure activity 3 minutes, left facial twitching, left gaze, slurred speech. Patient back to baseline and following commands.)  Provider response No new orders  Date of Provider Response 05/03/23  Time of Provider Response [phone]

## 2023-05-03 NOTE — Procedures (Signed)
 Intubation Procedure Note  Tyler [name]  [id number]  01/13/1951  Date:05/03/23  Time:9:07 AM   Provider Performing:Hallie Ertl Mechele Collin, MD   Procedure: Intubation (31500)  Indication(s) Respiratory Failure  Consent Risks of the procedure as well as the alternatives and risks of each were explained to the patient and/or caregiver.  Consent for the procedure was obtained and is signed in the bedside chart Consented daughter via telephone  Anesthesia Etomidate, Versed, Fentanyl, and Rocuronium   Time Out Verified patient identification, verified procedure, site/side was marked, verified correct patient position, special equipment/implants available, medications/allergies/relevant history reviewed, required imaging and test results available.   Sterile Technique Usual hand hygeine, masks, and gloves were used   Procedure Description Patient positioned in bed supine.  Sedation given as noted above.  Patient was intubated with endotracheal tube using Glidescope.  View was Grade 1 full glottis .  Number of attempts was 1.  Colorimetric CO2 detector was consistent with tracheal placement.   Complications/Tolerance None; patient tolerated the procedure well. Chest X-ray is ordered to verify placement.   EBL Minimal   Specimen(s) None

## 2023-05-03 NOTE — Progress Notes (Signed)
 LTM maint complete - no skin breakdown under:  Fp1, C4, O2

## 2023-05-03 NOTE — Progress Notes (Signed)
   05/03/23 0500  Provider Notification  Provider Name/Title Dr Otelia Limes and PCCM MD  Date Provider Notified 05/03/23  Time Provider Notified 0518  Method of Notification Page  Notification Reason Change in status (Another episode of seizure activity lasting 5 minutes. Patient somolent and unable to follow commands in postictal state. This seizure noted to be more intense than previous seizure at 0219)  Provider response See new orders  Date of Provider Response 05/03/23  Time of Provider Response 0522   2mg  Ativan

## 2023-05-03 NOTE — Progress Notes (Signed)
 The patient had a 3 minute seizure at about  0219. He has been having multiple seizures daily. He had left sided facial twitches. Did not give ativan since the episode was less than 5 minutes. He is back to baseline and following commands. Seizure activity noted and charted by EEG monitoring tech.   Electronically signed: Dr. Caryl Pina

## 2023-05-03 NOTE — Progress Notes (Signed)
 0815-DrEverardo All made aware of decreased LOC this morning, opening eyes but not speaking or following commands. Pt lungs sound course and has snoring respirations. Dr. Everardo All at bedside to assess at 0840, elected to intubate. RT called to bedside. Tyler [name] was intubated by 0915.   1005-Dr. Everardo All made aware of softer BP, Levo and LR bolus ordered.

## 2023-05-03 NOTE — Procedures (Addendum)
 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 EEG to evaluate for seizure    Level of alertness: Awake, asleep   AEDs during EEG study: LEV, LCM, Perampanel, Phenobarb   Technical aspects: This EEG study was done with scalp electrodes positioned according to the 10-20 International system of electrode placement. Electrical activity was reviewed with band pass filter of 1-70Hz , sensitivity of 7 uV/mm, display speed of 45mm/sec with a 60Hz  notched filter applied as appropriate. EEG data were recorded continuously and digitally stored.  Video monitoring was available and reviewed as appropriate.   Description: No clear posterior dominant rhythm was seen.  Sleep was characterized by sleep spindles (12 to 40 Hz), maximal frontocentral region.  EEG showed continuous generalized and maximal right parietal 3 to 6 Hz theta-delta slowing. Lateralized periodic discharges were noted in right hemisphere, maximal right parietal region with fluctuating frequency of 0.5-1 , at times with overlying rhythmicity. Hyperventilation and photic stimulation were not performed.      Patient was noted to have episodes of left-sided facial twitching.  Concomitant EEG showed lateralized periodic discharges with overlying rhythmicity in right hemisphere, maximal right parietal region which evolved in frequency from 5 Hz to 2 to 3 Hz and appeared more sharply contoured. Seizure were noted on 05/02/2023 at 1705, 1751, 1850, 1916, 2006, 2052, 2158 and on 05/03/2023 at 0032, 0203, 0259, 0326, 0446. Average duration of seizures was 2-6 minutes.    ABNORMALITY -Focal seizure, right parietal region -Lateralized periodic discharges with overlying rhythmicity, right hemisphere, maximal right parietal  region (  LPD+R) -Continuous slow, generalized and maximal right parietal region   IMPRESSION: This study showed focal motor seizures during which patient had left facial twitching arising from right parietal region. Seizure were noted on 05/02/2023 at 1705, 1751, 1850, 1916, 2006, 2052, 2158 and on 05/03/2023 at 0032, 0203, 0259, 0326, 0446. Average duration of seizures was 2-6 minutes.    Additionally there is evidence of epileptogenicity arising from right hemisphere, maximal right parietal region which is on the ictal-interictal continuum.  Lastly there is cortical dysfunction in right hemisphere, maximal right parietal region likely secondary to underlying structural abnormality as well as moderate diffuse encephalopathy.     Tyler [name] [name]

## 2023-05-03 NOTE — Progress Notes (Signed)
 LTM maint complete - no skin breakdown under: Fp1 Fp2 F4 A2 Serviced several leads [facility] monitored, Event button test confirmed by [facility].

## 2023-05-04 ENCOUNTER — Inpatient Hospital Stay (HOSPITAL_COMMUNITY)

## 2023-05-04 DIAGNOSIS — G40109 Localization-related (focal) (partial) symptomatic epilepsy and epileptic syndromes with simple partial seizures, not intractable, without status epilepticus: Secondary | ICD-10-CM | POA: Diagnosis not present

## 2023-05-04 DIAGNOSIS — G5139 Clonic hemifacial spasm, unspecified: Secondary | ICD-10-CM | POA: Diagnosis not present

## 2023-05-04 DIAGNOSIS — J9601 Acute respiratory failure with hypoxia: Secondary | ICD-10-CM | POA: Diagnosis not present

## 2023-05-04 DIAGNOSIS — C8339 Primary central nervous system lymphoma: Secondary | ICD-10-CM | POA: Diagnosis not present

## 2023-05-04 DIAGNOSIS — I10 Essential (primary) hypertension: Secondary | ICD-10-CM | POA: Diagnosis not present

## 2023-05-04 DIAGNOSIS — G40909 Epilepsy, unspecified, not intractable, without status epilepticus: Secondary | ICD-10-CM | POA: Diagnosis not present

## 2023-05-04 LAB — GLUCOSE, CAPILLARY
Glucose-Capillary: 184 mg/dL — ABNORMAL HIGH (ref 70–99)
Glucose-Capillary: 159 mg/dL — ABNORMAL HIGH (ref 70–99)
Glucose-Capillary: 145 mg/dL — ABNORMAL HIGH (ref 70–99)
Glucose-Capillary: 146 mg/dL — ABNORMAL HIGH (ref 70–99)
Glucose-Capillary: 115 mg/dL — ABNORMAL HIGH (ref 70–99)
Glucose-Capillary: 91 mg/dL (ref 70–99)

## 2023-05-04 LAB — CBC
HCT: 37.5 % — ABNORMAL LOW (ref 39.0–52.0)
Hemoglobin: 12.3 g/dL — ABNORMAL LOW (ref 13.0–17.0)
MCH: 29.7 pg (ref 26.0–34.0)
MCHC: 32.8 g/dL (ref 30.0–36.0)
MCV: 90.6 fL (ref 80.0–100.0)
Platelets: 151 10*3/uL (ref 150–400)
RBC: 4.14 MIL/uL — ABNORMAL LOW (ref 4.22–5.81)
RDW: 13.2 % (ref 11.5–15.5)
WBC: 3.3 10*3/uL — ABNORMAL LOW (ref 4.0–10.5)
nRBC: 0 % (ref 0.0–0.2)

## 2023-05-04 LAB — BASIC METABOLIC PANEL
Anion gap: 8 (ref 5–15)
BUN: 27 mg/dL — ABNORMAL HIGH (ref 8–23)
CO2: 26 mmol/L (ref 22–32)
Calcium: 8.1 mg/dL — ABNORMAL LOW (ref 8.9–10.3)
Chloride: 101 mmol/L (ref 98–111)
Creatinine, Ser: 0.79 mg/dL (ref 0.61–1.24)
GFR, Estimated: >60 mL/min (ref >60)
Glucose, Bld: 125 mg/dL — ABNORMAL HIGH (ref 70–99)
Potassium: 3.9 mmol/L (ref 3.5–5.1)
Sodium: 135 mmol/L (ref 135–145)

## 2023-05-04 LAB — PHENOBARBITAL LEVEL: Phenobarbital: 12.7 ug/mL — ABNORMAL LOW (ref 15.0–40.0)

## 2023-05-04 MED ORDER — PHENOBARBITAL 32.4 MG PO TABS
97.2000 mg | ORAL_TABLET | Freq: Once | ORAL | Status: AC
  Administered 2023-05-04: 97.2 mg via ORAL
  Filled 2023-05-04: qty 3

## 2023-05-04 MED ORDER — HYDRALAZINE HCL 50 MG PO TABS
50.0000 mg | ORAL_TABLET | Freq: Three times a day (TID) | ORAL | Status: DC | PRN
  Filled 2023-05-04: qty 1

## 2023-05-04 NOTE — Progress Notes (Signed)
 Subjective: Seizures are significantly improved, just one this morning.  He is more awake today.  ROS: negative except above  Examination  Vital signs in last 24 hours: Temp:  [98.5 F (36.9 C)-99.9 F (37.7 C)] 99.3 F (37.4 C) (03/09 1200) Pulse Rate:  [57-89] 74 (03/09 1600) Resp:  [16-29] 18 (03/09 1600) BP: (82-163)/(59-99) 100/69 (03/09 1600) SpO2:  [95 %-100 %] 99 % (03/09 1600) FiO2 (%):  [40 %] 40 % (03/09 1414)  General: lying in bed, NAD Neuro: He is awake, engages with the examiner, follows simple commands.  He does not cooperate with formal strength testing.  Pupils are reactive bilaterally, extraocular movements are intact.   Basic Metabolic Panel: Recent Labs  Lab 04/28/23 1120 04/28/23 2124 04/29/23 0432 04/29/23 1652 04/30/23 0449 05/01/23 0559 05/02/23 0558 05/03/23 0500 05/03/23 1118 05/04/23 1133  NA 138  --  137  --  137 136 135 133* 134* 135  K 4.2  --  4.0  --  4.4 4.0 3.9 4.0 4.0 3.9  CL 100  --  102  --  100 104 105 99  --  101  CO2 29  --  25  --  27 26 26 25   --  26  GLUCOSE 105*  --  140*  --  150* 128* 131* 127*  --  125*  BUN 18  --  22  --  25* 30* 29* 27*  --  27*  CREATININE 1.07  --  0.88  --  0.86 0.98 0.89 0.86  --  0.79  CALCIUM 8.2*  --  8.1*  --  8.4* 7.7* 7.5* 8.0*  --  8.1*  MG 1.9 1.9 1.8 2.1 1.9 1.9 1.9 1.9  --   --   PHOS 3.2 2.7 2.6 2.4*  --   --   --   --   --   --     CBC: Recent Labs  Lab 04/30/23 0449 05/01/23 0559 05/02/23 0558 05/03/23 0500 05/03/23 1118 05/04/23 1133  WBC 3.2* 4.0 3.4* 2.5*  --  3.3*  HGB 12.2* 11.5* 11.5* 11.9* 10.2* 12.3*  HCT 37.4* 35.3* 34.1* 36.5* 30.0* 37.5*  MCV 91.4 91.9 91.9 90.3  --  90.6  PLT 132* 129* 131* 131*  --  151     Coagulation Studies: No results for input(s): "LABPROT", "INR" in the last 72 hours.  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  His levels are almost therapeutic on phenobarbital now, will give a single extra dose of 100 mg today.   Epilepsy with breakthrough seizure Cerebral edema B-cell lymphoma -In the setting of underlying lymphoma and edema   Recommendations -Continue phenobarbital 97mg  daily, single extra dose today -Continue perampanel 6 mg nightly -Increase Vimpat to 200 mg twice daily -Continue Keppra 2000 mg twice daily -Continue video EEG to monitor for intermittent seizures  This patient is critically ill and at significant risk of neurological worsening, death and care requires constant monitoring of vital signs, hemodynamics,respiratory and cardiac monitoring, neurological assessment, discussion with family, other specialists and medical decision making of high complexity. I spent 43 minutes of neurocritical care time  in the  care of  this patient. This was time spent independent of any time provided by nurse practitioner or PA.  Ritta Slot, MD [facility]   If 7pm- 7am, please page neurology on call as listed in AMION. 05/04/2023  4:55 PM

## 2023-05-04 NOTE — Progress Notes (Signed)
 NAME:  Gaetano Romberger, MRN:  [id number], DOB:  1950-03-07, LOS: 12 ADMISSION DATE:  04/22/2023, CONSULTATION/ SERVICE  DATE:  04/25/23 REFERRING MD:  Dr. Candise Che, CHIEF COMPLAINT: Neck swelling, drooling, difficulty swallowing, altered mental status with concern for partial seizures  History of Present Illness:  Mr. Hazel is a 73 year old gentleman with significant history of CNS relapse of large B-cell lymphoma, partial seizure who was admitted to cardiology service on 2/25 for second cycle of high-dose methotrexate with leucovorin rescue.  Patient also has prior history of paroxysmal atrial fibrillation not on anticoagulation due to bleeding risk, prostate cancer status post prostatectomy in 2021, pulmonary embolism, hepatitis B exposure.  Patient had high-grade large B-cell lymphoma and completed 6 cycles of R-CHOP with systemic remission in the past.  As per the primary oncologist, patient was doing well and receiving his medication as planned.  He was started on bicarb drip to minimize the methotrexate related toxicity.  However, this evening, oncologist noted increased neck swelling as well as patient had difficulty swallowing.  He was also noted to have drooling of saliva and developed altered mental status with facial twitching concerning for seizure activity.  There was concern about patient's ability to protect airway and possible angioedema (neck swelling), ED was consulted and patient was intubated emergently.  At the time of my evaluation, patient was intubated and unresponsive.  No family member at the bedside.  Rest of the information is as per the chart review and I was unable to independently verify with the patient.   PAST    has a past medical history of Cancer (HCC), Follicular lymphoma (HCC) (2024), History of pulmonary embolism, Hypertension, Myelodysplastic syndrome (HCC), PAF (paroxysmal atrial fibrillation) (HCC), and Recurrent UTI.   has a past surgical history that includes  Robot assisted laparoscopic radical prostatectomy (N/A, 09/01/2019); Lymphadenectomy (Bilateral, 09/01/2019); Esophagogastroduodenoscopy (egd) with propofol (N/A, 08/21/2022); biopsy (08/21/2022); IR IMAGING GUIDED PORT INSERTION (08/26/2022); Craniotomy (Right, 03/10/2023); and Application of cranial navigation (Right, 03/10/2023).   Significant events   04/22/2023 - ADMIT  2/27  - MRI brain - Marked interval decrease in size of right parietal mass/lymphoma, now measuring 2.9 x 3.5 x 2.3 cm (previously 5.4 x 3.6 x 4.4 cm), consistent with interval response to therapy. Persistent but improved vasogenic edema within the adjacent posterior right cerebral hemisphere, with interval resolution of previously seen right-to-left midline shift. 2. 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 Correlation with symptomatology and EEG recommended. Evolving changes of subacute ischemia would be the primary differential consideration, but are felt to be less likely.  2/28 - Transferred to ICU and intubated. Witnessed by Dr Candise Che with have left neck swelling, gurgling, and twitching left side -> then partially respnsvive -> ativan/confused -> EDP intubated. Around 7.30-8pm  04/26/23: Coretrack placed early hours .Intubated early hours.  On the ventilator FiO2 50%.  Low-grade fever since 04/17/2023 but normal white count. Ceribell EEG oongoing. Prmary language for aptient is Swahili. SBP HIGH with HR 40s - on scheduled lopressor . Ceribel shows    ceribell EEG is is suggestive of severe diffuse encephalopathy. No seizures were seen throughout the recording.   3/2 Extubated. Remains on PANDA. Scheduled for barium today. Remains on cEEG per Neuro  3/3-3/7 Continues to have focal seizures refractory to increased AED use. Family meeting held with agreement to continue to escalate treatment and proceed with intubation if  needed.  3/8 Intubated   SUBJECTIVE/OVERNIGHT/INTERVAL  HX   Intubated yesterday. Family including daughter updated at bedside. Increased AEDs at bedside Required low dose pressors for hypotension related to AED/sedation use  Objective   Blood pressure (!) 140/92, pulse 64, temperature 99.2 F (37.3 C), temperature source Axillary, resp. rate (!) 21, height 5' 7.01" (1.702 m), weight 90.1 kg, SpO2 99%.    Vent Mode: PRVC FiO2 (%):  [40 %-100 %] 40 % Set Rate:  [18 bmp-20 bmp] 18 bmp Vt Set:  [530 mL] 530 mL PEEP:  [5 cmH20] 5 cmH20 Plateau Pressure:  [17 cmH20-22 cmH20] 17 cmH20   Intake/Output Summary (Last 24 hours) at 05/04/2023 0733 Last data filed at 05/04/2023 0600 Gross per 24 hour  Intake 2436.09 ml  Output 725 ml  Net 1711.09 ml   Filed Weights   04/28/23 0500 04/29/23 0450 04/30/23 0500  Weight: 89.2 kg 89.1 kg 90.1 kg   Physical Exam: General: Chronically ill-appearing, sedated HENT: [place], AT, ETT in place Eyes: EOMI, no scleral icterus Respiratory: Clear to auscultation bilaterally.  No crackles, wheezing or rales Cardiovascular: RRR, -M/R/G, no JVD GI: BS+, soft, nontender Extremities:-Edema,-tenderness Neuro: Opens eyes to sternal rub, tracks, PERRL, sedated GU: External foley in place   Imaging, labs and test noted above have been reviewed independently by me. CXR with ETT in place No labs available this am, previously stable   Assessment & Plan:   Partial sensory seizures, left side - ongoing seizures Acute encephalopathy  -on 04/25/2203 felt due to CNS lymphoma with edema -04/25/2023: Morning reports of relatively normal mental status but at night sudden worsening with obtundation and potential seizures and intubated. MRI Mass improvd but 2/27 - New diffusion signal abnormality involving the right insular cortex and overlying right frontal lobe, favored to reflect sequelae of seizure -3/8 Intubated for decreased mental status in setting of seizures and  increased AED use P:  AED management per Neuro. 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 S/p chemotherapy R-CHOP.  S/p craniotomy with gross total resection 03/10/23.  - CT of head 04/07/2023 showed interval increase in size of tumor at the craniotomy site. MRI of head 04/07/2023 increase in size of tumor with slightly increased surrounding edema. MRI brain 2/27  - improvmeent - Currently on Hi-dose methotrexate chemotherapy regimen which is more aggressive approach.  Received cycle 1 on 04/09/23 and admitted for cycle 2 of HIGH DOSE Methotrexate chemotherapy regimen,  ompleted 04/23/2023. P: Oncology following  - Now methotrexate level < 0.1. Off sodium bicarb, off leucovorin. No longer monitoring urine PH --PO dexamethasone for brain edema - IV Rituxan as outpatient between 2nd and 3rd cycle of methotrexate and subsequently between treatment cycles. Plan for first week of March 2025 if discharged - can be delayed if needed> May start methotrexate while inpatient Restart home multivitamins including b complex and folic acid 3/5  Acute respiratory failure 04/25/2023 evening associated with neck swelling   - Initially had neck swelling at time but CT neck is normal and exam 3/1 - is fat tissue. Able to extubate on 3/2. P:   Extubated 3/2. Reintubated 3/8 for increased AED utilization Full vent support LTVV, 4-8cc/kg IBW with goal Pplat<30 and DP<15 VAP PAD for RASS -1  Hypertensive and Bradycardic on lopressor 04/26/23 Normal echo 04/26/23 P:  DC lopressor due to bradycardia 3/2 Started scheduled hydralazine 3/3 Tele  Hep B antibody positive, chronic P: On tenofovir  for hep B reactivation suppression in context of Rituxan use. Plan is to continue for 6 months after his last chemotherapy. Follow ID outpatient  Intermittent fever  Likely  secondary to seizure. No source of infection. RVP neg 3/2- current P: Monitor fever curve/WBC off antibiotics CXR normal  Mild anemia of critical illness onset less than 13 g% 04/26/2023 P:  Trend Transfuse if needed for Hg goal >7  Mild thrombocytopenia - mild P: Lovenox 40 mg every 24 hours    At risk for hypo and hyperglycemia P:   SSI Tube feeds   Best practice:  Diet: Tube feeds Pain/Anxiety/Delirium protocol (if indicated): Yes VAP protocol (if indicated): Yes DVT prophylaxis: lovnox GI prophylaxis: Home PPI Glucose control: ssi Mobility: bed rest Code Status: full Family Communication:   3/8 Updated daughter at bedside   ATTESTATION & SIGNATURE   The patient is critically ill with multiple organ systems failure and requires high complexity decision making for assessment and support, frequent evaluation and titration of therapies, application of advanced monitoring technologies and extensive interpretation of multiple databases.  Independent Critical Care Time: 38 Minutes.   Mechele Collin, M.D. [facility] Pulmonary/Critical Care Medicine 05/04/2023 7:33 AM   Please see Amion for pager number to reach on-call Pulmonary and Critical Care Team.

## 2023-05-04 NOTE — Progress Notes (Signed)
 LTM maint complete - no skin breakdown under:  O1, F4

## 2023-05-04 NOTE — Procedures (Addendum)
 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 EEG to evaluate for seizure    Level of alertness: Awake, asleep   AEDs during EEG study: LEV, LCM, Perampanel, Phenobarb   Technical aspects: This EEG study was done with scalp electrodes positioned according to the 10-20 International system of electrode placement. Electrical activity was reviewed with band pass filter of 1-70Hz , sensitivity of 7 uV/mm, display speed of 3mm/sec with a 60Hz  notched filter applied as appropriate. EEG data were recorded continuously and digitally stored.  Video monitoring was available and reviewed as appropriate.   Description: No clear posterior dominant rhythm was seen.  Sleep was characterized by sleep spindles (12 to 40 Hz), maximal frontocentral region.  EEG showed continuous generalized and maximal right parietal 3 to 6 Hz theta-delta slowing. Lateralized periodic discharges were noted in right hemisphere, maximal right parietal region with fluctuating frequency of 0.25-1, at times with overlying rhythmicity. Hyperventilation and photic stimulation were not performed.      Event button was pressed on 05/04/2023 at 0837. Patient was noted to have episodes of left-shoulder twitching.  Concomitant EEG showed lateralized periodic discharges with overlying rhythmicity in right hemisphere, maximal right parietal region as well as 13-15hz x beta activity. Duration of seizures was about 3.5 minutes.   ABNORMALITY -Focal seizure, right parietal region -Lateralized periodic discharges with overlying rhythmicity, right hemisphere, maximal right parietal  region ( LPD+R) -Continuous slow, generalized and maximal right parietal region   IMPRESSION: This study showed one focal motor seizure arising from  right parietal region on 05/04/2023 at 0837, lasting for about 3.5 minutes during which patient had left shoulder twitching.   Additionally there is evidence of epileptogenicity arising from right hemisphere, maximal right parietal region which is on the ictal-interictal continuum.   Lastly there is cortical dysfunction in right hemisphere, maximal right parietal region likely secondary to underlying structural abnormality as well as moderate diffuse encephalopathy.   Sangeeta Youse [name]

## 2023-05-05 ENCOUNTER — Inpatient Hospital Stay (HOSPITAL_COMMUNITY)

## 2023-05-05 DIAGNOSIS — C8339 Primary central nervous system lymphoma: Secondary | ICD-10-CM | POA: Diagnosis not present

## 2023-05-05 DIAGNOSIS — R509 Fever, unspecified: Secondary | ICD-10-CM | POA: Diagnosis not present

## 2023-05-05 DIAGNOSIS — G936 Cerebral edema: Secondary | ICD-10-CM | POA: Diagnosis not present

## 2023-05-05 DIAGNOSIS — Z8546 Personal history of malignant neoplasm of prostate: Secondary | ICD-10-CM | POA: Diagnosis not present

## 2023-05-05 DIAGNOSIS — G40109 Localization-related (focal) (partial) symptomatic epilepsy and epileptic syndromes with simple partial seizures, not intractable, without status epilepticus: Secondary | ICD-10-CM | POA: Diagnosis not present

## 2023-05-05 DIAGNOSIS — J9600 Acute respiratory failure, unspecified whether with hypoxia or hypercapnia: Secondary | ICD-10-CM | POA: Diagnosis not present

## 2023-05-05 DIAGNOSIS — G9340 Encephalopathy, unspecified: Secondary | ICD-10-CM | POA: Diagnosis not present

## 2023-05-05 LAB — CBC
HCT: 35 % — ABNORMAL LOW (ref 39.0–52.0)
Hemoglobin: 11.5 g/dL — ABNORMAL LOW (ref 13.0–17.0)
MCH: 30.1 pg (ref 26.0–34.0)
MCHC: 32.9 g/dL (ref 30.0–36.0)
MCV: 91.6 fL (ref 80.0–100.0)
Platelets: 153 10*3/uL (ref 150–400)
RBC: 3.82 MIL/uL — ABNORMAL LOW (ref 4.22–5.81)
RDW: 13.5 % (ref 11.5–15.5)
WBC: 3.1 10*3/uL — ABNORMAL LOW (ref 4.0–10.5)
nRBC: 1 % — ABNORMAL HIGH (ref 0.0–0.2)

## 2023-05-05 LAB — GLUCOSE, CAPILLARY
Glucose-Capillary: 155 mg/dL — ABNORMAL HIGH (ref 70–99)
Glucose-Capillary: 127 mg/dL — ABNORMAL HIGH (ref 70–99)
Glucose-Capillary: 93 mg/dL (ref 70–99)
Glucose-Capillary: 135 mg/dL — ABNORMAL HIGH (ref 70–99)
Glucose-Capillary: 138 mg/dL — ABNORMAL HIGH (ref 70–99)
Glucose-Capillary: 83 mg/dL (ref 70–99)

## 2023-05-05 LAB — BASIC METABOLIC PANEL
Anion gap: 9 (ref 5–15)
BUN: 26 mg/dL — ABNORMAL HIGH (ref 8–23)
CO2: 24 mmol/L (ref 22–32)
Calcium: 7.9 mg/dL — ABNORMAL LOW (ref 8.9–10.3)
Chloride: 102 mmol/L (ref 98–111)
Creatinine, Ser: 0.7 mg/dL (ref 0.61–1.24)
GFR, Estimated: >60 mL/min (ref >60)
Glucose, Bld: 118 mg/dL — ABNORMAL HIGH (ref 70–99)
Potassium: 3.9 mmol/L (ref 3.5–5.1)
Sodium: 135 mmol/L (ref 135–145)

## 2023-05-05 LAB — PHENOBARBITAL LEVEL: Phenobarbital: 14.3 ug/mL — ABNORMAL LOW (ref 15.0–40.0)

## 2023-05-05 MED ORDER — GABAPENTIN 100 MG PO CAPS
100.0000 mg | ORAL_CAPSULE | Freq: Three times a day (TID) | ORAL | Status: DC
  Filled 2023-05-05: qty 1

## 2023-05-05 MED ORDER — GABAPENTIN 250 MG/5ML PO SOLN
100.0000 mg | Freq: Three times a day (TID) | ORAL | Status: DC
  Administered 2023-05-05 – 2023-05-23 (×50): 100 mg
  Filled 2023-05-05 (×62): qty 2

## 2023-05-05 MED ORDER — LORAZEPAM 2 MG/ML IJ SOLN: 2.0000 mg | INTRAMUSCULAR | Status: DC | PRN

## 2023-05-05 NOTE — Procedures (Addendum)
 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 EEG to evaluate for seizure    Level of alertness: Awake, asleep   AEDs during EEG study: LEV, LCM, Perampanel, Phenobarb   Technical aspects: This EEG study was done with scalp electrodes positioned according to the 10-20 International system of electrode placement. Electrical activity was reviewed with band pass filter of 1-70Hz , sensitivity of 7 uV/mm, display speed of 47mm/sec with a 60Hz  notched filter applied as appropriate. EEG data were recorded continuously and digitally stored.  Video monitoring was available and reviewed as appropriate.   Description: No clear posterior dominant rhythm was seen.  Sleep was characterized by sleep spindles (12 to 40 Hz), maximal frontocentral region.  EEG showed continuous generalized and maximal right parietal 3 to 6 Hz theta-delta slowing. Lateralized periodic discharges were noted in right hemisphere, maximal right parietal region with fluctuating frequency of 0.25-1, at times with overlying rhythmicity. Hyperventilation and photic stimulation were not performed.      ABNORMALITY -Lateralized periodic discharges with overlying rhythmicity, right hemisphere, maximal right parietal  region ( LPD+R) -Continuous slow, generalized and maximal right parietal region   IMPRESSION: This study showed evidence of epileptogenicity arising from right hemisphere, maximal right parietal region which is on the ictal-interictal continuum. Additionally there is cortical dysfunction in right hemisphere, maximal right parietal region likely secondary to underlying structural abnormality.  Lastly there is moderate diffuse encephalopathy. No definite seizures were noted.   Marlinda Miranda [name]

## 2023-05-05 NOTE — Progress Notes (Signed)
 SLP Cancellation Note  Patient Details Name: Tyler [name] MRN: [id number] DOB: 07/24/1950   Cancelled treatment:       Reason Eval/Treat Not Completed: Medical issues which prohibited therapy. Pt intubated 3/8 and per MD note this am, to remain on full vent support for now. SLP will f/u as able.     Mahala Menghini., M.A. CCC-SLP Acute Rehabilitation Services Office [phone]  Secure chat preferred  05/05/2023, 8:41 AM

## 2023-05-05 NOTE — Progress Notes (Signed)
 LTM maint complete - no skin breakdown under:  O2, Fp2

## 2023-05-05 NOTE — Progress Notes (Signed)
 PT Cancellation Note  Patient Details Name: Tyler [name] MRN: [id number] DOB: [birth year]   Cancelled Treatment:    Reason Eval/Treat Not Completed: Medical issues which prohibited therapy (pt still intubated and having active seizures per RN).  Lillia Pauls, PT, DPT Acute Rehabilitation Services Office [phone]    Norval Morton 05/05/2023, 9:26 AM

## 2023-05-05 NOTE — Progress Notes (Signed)
 LTM maint complete - no skin breakdown seen. [facility] monitored, Event button test confirmed by [facility].

## 2023-05-05 NOTE — Progress Notes (Signed)
 Inpatient Rehab Admissions Coordinator:   Following for my colleague Ottie Glazier.  Note change in status over the weekend, pt remains intubated and on continuous EEG.   Estill Dooms, PT, DPT Admissions Coordinator [phone] 05/05/23  10:30 AM

## 2023-05-05 NOTE — Progress Notes (Signed)
 NAME:  Tyler [name], MRN:  [id number], DOB:  1951-01-20, LOS: 13 ADMISSION DATE:  04/22/2023, CONSULTATION/ SERVICE  DATE:  04/25/23 REFERRING MD:  Dr. Candise Che, CHIEF COMPLAINT: Neck swelling, drooling, difficulty swallowing, altered mental status with concern for partial seizures  History of Present Illness:  Tyler [name] is a 73 year old gentleman with significant history of CNS relapse of large B-cell lymphoma, partial seizure who was admitted to cardiology service on 2/25 for second cycle of high-dose methotrexate with leucovorin rescue.  Patient also has prior history of paroxysmal atrial fibrillation not on anticoagulation due to bleeding risk, prostate cancer status post prostatectomy in 2021, pulmonary embolism, hepatitis B exposure.  Patient had high-grade large B-cell lymphoma and completed 6 cycles of R-CHOP with systemic remission in the past.  As per the primary oncologist, patient was doing well and receiving his medication as planned.  He was started on bicarb drip to minimize the methotrexate related toxicity.  However, this evening, oncologist noted increased neck swelling as well as patient had difficulty swallowing.  He was also noted to have drooling of saliva and developed altered mental status with facial twitching concerning for seizure activity.  There was concern about patient's ability to protect airway and possible angioedema (neck swelling), ED was consulted and patient was intubated emergently.  At the time of my evaluation, patient was intubated and unresponsive.  No family member at the bedside.  Rest of the information is as per the chart review and I was unable to independently verify with the patient.   PAST    has a past medical history of Cancer (HCC), Follicular lymphoma (HCC) (2024), History of pulmonary embolism, Hypertension, Myelodysplastic syndrome (HCC), PAF (paroxysmal atrial fibrillation) (HCC), and Recurrent UTI.   has a past surgical history that includes  Robot assisted laparoscopic radical prostatectomy (N/A, 09/01/2019); Lymphadenectomy (Bilateral, 09/01/2019); Esophagogastroduodenoscopy (egd) with propofol (N/A, 08/21/2022); biopsy (08/21/2022); IR IMAGING GUIDED PORT INSERTION (08/26/2022); Craniotomy (Right, 03/10/2023); and Application of cranial navigation (Right, 03/10/2023).   Significant events   04/22/2023 - ADMIT  2/27  - MRI brain - Marked interval decrease in size of right parietal mass/lymphoma, now measuring 2.9 x 3.5 x 2.3 cm (previously 5.4 x 3.6 x 4.4 cm), consistent with interval response to therapy. Persistent but improved vasogenic edema within the adjacent posterior right cerebral hemisphere, with interval resolution of previously seen right-to-left midline shift. 2. 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 Correlation with symptomatology and EEG recommended. Evolving changes of subacute ischemia would be the primary differential consideration, but are felt to be less likely.  2/28 - Transferred to ICU and intubated. Witnessed by Dr Candise Che with have left neck swelling, gurgling, and twitching left side -> then partially respnsvive -> ativan/confused -> EDP intubated. Around 7.30-8pm  04/26/23: Coretrack placed early hours .Intubated early hours.  On the ventilator FiO2 50%.  Low-grade fever since 04/17/2023 but normal white count. Ceribell EEG oongoing. Prmary language for aptient is Swahili. SBP HIGH with HR 40s - on scheduled lopressor . Ceribel shows    ceribell EEG is is suggestive of severe diffuse encephalopathy. No seizures were seen throughout the recording.   3/2 Extubated. Remains on PANDA. Scheduled for barium today. Remains on cEEG per Neuro  3/3-3/7 Continues to have focal seizures refractory to increased AED use. Family meeting held with agreement to continue to escalate treatment and proceed with intubation if  needed.  3/8 Intubated   SUBJECTIVE/OVERNIGHT/INTERVAL  HX   Remains on the ventilator and continuous EEG monitoring Had recurrent seizure episodes overnight  Objective   Blood pressure 98/69, pulse 72, temperature (!) 100.9 F (38.3 C), temperature source Axillary, resp. rate (!) 21, height 5' 7.01" (1.702 m), weight 84.5 kg, SpO2 100%.    Vent Mode: AC;PRVC FiO2 (%):  [30 %-40 %] 30 % Set Rate:  [18 bmp] 18 bmp Vt Set:  [530 mL] 530 mL PEEP:  [5 cmH20] 5 cmH20 Plateau Pressure:  [16 cmH20-18 cmH20] 18 cmH20   Intake/Output Summary (Last 24 hours) at 05/05/2023 0747 Last data filed at 05/05/2023 0700 Gross per 24 hour  Intake 1783.87 ml  Output 1450 ml  Net 333.87 ml   Filed Weights   04/29/23 0450 04/30/23 0500 05/05/23 0500  Weight: 89.1 kg 90.1 kg 84.5 kg   Physical Exam: Gen:      No acute distress, chronically ill-appearing, sedated HEENT:  EOMI, sclera anicteric Neck:     No masses; no thyromegaly, ET tube Lungs:    Clear to auscultation bilaterally; normal respiratory effort CV:         Regular rate and rhythm; no murmurs Abd:      + bowel sounds; soft, non-tender; no palpable masses, no distension Ext:    No edema; adequate peripheral perfusion Skin:      Warm and dry; no rash Neuro: Sedated  Lab/imaging reviewed Significant for BUN/creatinine 27/0.79 WBC 3.3, hemoglobin 4.3, platelets 151 No new imaging    Assessment & Plan:   Partial sensory seizures, left side - ongoing seizures Acute encephalopathy  -on 04/25/2203 felt due to CNS lymphoma with edema -04/25/2023: Morning reports of relatively normal mental status but at night sudden worsening with obtundation and potential seizures and intubated. MRI Mass improvd but 2/27 - New diffusion signal abnormality involving the right insular cortex and overlying right frontal lobe, favored to reflect sequelae of seizure -3/8 Intubated for decreased mental status in setting of seizures and increased AED use P:   AED management per Neuro.  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 S/p chemotherapy R-CHOP.  S/p craniotomy with gross total resection 03/10/23.  - CT of head 04/07/2023 showed interval increase in size of tumor at the craniotomy site. MRI of head 04/07/2023 increase in size of tumor with slightly increased surrounding edema. MRI brain 2/27  - improvmeent - Currently on Hi-dose methotrexate chemotherapy regimen which is more aggressive approach.  Received cycle 1 on 04/09/23 and admitted for cycle 2 of HIGH DOSE Methotrexate chemotherapy regimen,  ompleted 04/23/2023. P: Oncology following  - Now methotrexate level < 0.1. Off sodium bicarb, off leucovorin. No longer monitoring urine PH --PO dexamethasone for brain edema - IV Rituxan as outpatient between 2nd and 3rd cycle of methotrexate and subsequently between treatment cycles. Plan for first week of March 2025 if discharged - can be delayed if needed> May start methotrexate while inpatient New multivitamins, folic acid  Acute respiratory failure 04/25/2023 evening associated with neck swelling   - Initially had neck swelling at time but CT neck is normal and exam 3/1 - is fat tissue. Able to extubate on 3/2. P:   Extubated 3/2. Reintubated 3/8 for increased AED utilization Continue full vent support for now.  No plans on weaning Follow intermittent chest x-ray  Hypertensive and Bradycardic on lopressor 04/26/23 Normal echo  04/26/23 P:  DC lopressor due to bradycardia 3/2 Started scheduled hydralazine 3/3 Tele  Hep B antibody positive, chronic P: On tenofovir for hep B reactivation suppression in context of Rituxan use. Plan is to continue for 6 months after his last chemotherapy. Follow ID outpatient  Intermittent fever  Likely secondary to seizure.  No source of infection. RVP neg 3/2- current P: No clear evidence of ongoing infection Observe off antibiotics  Mild anemia of critical illness onset less than 13 g% 04/26/2023 P:  Trend Transfuse if needed for Hg goal >7  Mild thrombocytopenia - mild P: Lovenox 40 mg every 24 hours    At risk for hypo and hyperglycemia P:   SSI Tube feeds   Best practice:  Diet: Tube feeds Pain/Anxiety/Delirium protocol (if indicated): Yes VAP protocol (if indicated): Yes DVT prophylaxis: lovnox GI prophylaxis: Home PPI Glucose control: ssi Mobility: bed rest Code Status: full Family Communication:     ATTESTATION & SIGNATURE   The patient is critically ill with multiple organ system failure and requires high complexity decision making for assessment and support, frequent evaluation and titration of therapies, advanced monitoring, review of radiographic studies and interpretation of complex data.   Critical Care Time devoted to patient care services, exclusive of separately billable procedures, described in this note is 35 minutes.   Chilton Greathouse MD [facility] See Amion for pager  If no response to pager , please call [phone] until 7pm After 7:00 pm call Elink  [phone] 05/05/2023, 8:20 AM

## 2023-05-05 NOTE — Progress Notes (Signed)
 Nutrition Follow-up  DOCUMENTATION CODES:   Not applicable  INTERVENTION:   Tube feeding via Cortrak tube: Osmolite 1.5 @ 55 ml/hr (1320 ml per day)  Prosource TF20 60 ml BID  Provides 2140 kcal, 122 gm protein, 1003 ml free water daily   NUTRITION DIAGNOSIS:   Inadequate oral intake related to  (seizures) as evidenced by NPO status. Ongoing.   GOAL:   Patient will meet greater than or equal to 90% of their needs Met with TF at goal   MONITOR:   TF tolerance, I & O's  REASON FOR ASSESSMENT:   Consult Enteral/tube feeding initiation and management (trickles)  ASSESSMENT:   73 y.o. male with PMH prostate cancer s/p prostatectomy in 2021, pulmonary embolism, hepatitis B exposure, and high-grade large B-cell lymphoma and completed 6 cycles of R-CHOP (chemo) with systemic remission in the past. 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  Pt discussed during ICU rounds and with RN and MD.  No family present during visit.  Last BM 3/7 now on bowel regimen   2/25 - admitted  2/28 - intubated 3/1 - s/p cortrak placement (at [facility]); tip gastric per xray 3/2 - extubated 3/8 - re-intubated  Medications reviewed and include: Vitamin D3 2000 IU daily, Vitamin B12 1000 mcg daily, decadron, colace, folic acid, SSI every 4 hours, protonix, miralax, thiamine  Precedex  Labs reviewed:  CBG's: 91-159  Current weight: 84.5 kg Admission Weight: 84.8 kg    Diet Order:   Diet Order             Diet NPO time specified  Diet effective now                   EDUCATION NEEDS:   Not appropriate for education at this time  Skin:  Skin Assessment: Reviewed RN Assessment  Last BM:  3/7  Height:   Ht Readings from Last 1 Encounters:  05/03/23 5' 7.01" (1.702 m)    Weight:   Wt Readings from Last 1 Encounters:  05/05/23 84.5 kg    Ideal Body Weight:  67.27  kg  BMI:  Body mass index is 29.17 kg/m.  Estimated Nutritional Needs:   Kcal:  2100-2350 kcals  Protein:  115-125g  Fluid:  >/= 2.1L  Kienan Doublin P., RD, LDN, CNSC See AMiON for contact information

## 2023-05-05 NOTE — Progress Notes (Signed)
   Inpatient Rehabilitation Admissions Coordinator   Patient intubated. We will sign off. Please reorder when appropriate.  Ottie Glazier, RN, MSN Rehab Admissions Coordinator [phone] 05/05/2023 5:26 PM

## 2023-05-05 NOTE — Progress Notes (Signed)
 Subjective: Received 2 mg of IV Ativan yesterday at around 1937 for seizure.  ROS: Unable to obtain due to poor mental status  Examination  Vital signs in last 24 hours: Temp:  [98.8 F (37.1 C)-100.9 F (38.3 C)] 99.1 F (37.3 C) (03/10 0800) Pulse Rate:  [69-92] 79 (03/10 1000) Resp:  [16-33] 23 (03/10 1000) BP: (82-151)/(63-95) 126/95 (03/10 1000) SpO2:  [95 %-100 %] 96 % (03/10 1000) FiO2 (%):  [30 %-40 %] 30 % (03/10 0800) Weight:  [84.5 kg] 84.5 kg (03/10 0500)  General: lying in bed, intubated Neuro: Awake, does not look at examiner, does not answer orientation questions, did not follow commands, pupils equally round and reactive, right gaze preference but able to look towards left, did not withdraw to noxious stimuli in all extremities   Basic Metabolic Panel: Recent Labs  Lab 04/28/23 1120 04/28/23 2124 04/29/23 0432 04/29/23 1652 04/30/23 0449 05/01/23 0559 05/02/23 0558 05/03/23 0500 05/03/23 1118 05/04/23 1133  NA 138  --  137  --  137 136 135 133* 134* 135  K 4.2  --  4.0  --  4.4 4.0 3.9 4.0 4.0 3.9  CL 100  --  102  --  100 104 105 99  --  101  CO2 29  --  25  --  27 26 26 25   --  26  GLUCOSE 105*  --  140*  --  150* 128* 131* 127*  --  125*  BUN 18  --  22  --  25* 30* 29* 27*  --  27*  CREATININE 1.07  --  0.88  --  0.86 0.98 0.89 0.86  --  0.79  CALCIUM 8.2*  --  8.1*  --  8.4* 7.7* 7.5* 8.0*  --  8.1*  MG 1.9 1.9 1.8 2.1 1.9 1.9 1.9 1.9  --   --   PHOS 3.2 2.7 2.6 2.4*  --   --   --   --   --   --     CBC: Recent Labs  Lab 04/30/23 0449 05/01/23 0559 05/02/23 0558 05/03/23 0500 05/03/23 1118 05/04/23 1133  WBC 3.2* 4.0 3.4* 2.5*  --  3.3*  HGB 12.2* 11.5* 11.5* 11.9* 10.2* 12.3*  HCT 37.4* 35.3* 34.1* 36.5* 30.0* 37.5*  MCV 91.4 91.9 91.9 90.3  --  90.6  PLT 132* 129* 131* 131*  --  151     Coagulation Studies: No results for input(s): "LABPROT", "INR" in the last 72 hours.  Imaging No new brain imaging overnight  ASSESSMENT AND  PLAN: 73 year old male with history of A-fib not on anticoagulation due to bleeding risk, prostate cancer status post prostatectomy, PE, hep B exposure, CNS recent labs of large B-cell lymphoma, focal seizures on Keppra who was admitted for inpatient chemo and was noted to have left facial twitching consistent with focal seizures.   Epilepsy with breakthrough seizure Cerebral edema B-cell lymphoma -In the setting of underlying lymphoma and edema   Recommendation -Start gabapentin 100 mg 3 times daily -Continue phenobarbital 97mg  daily, perampanel 6mg  at bedtime, Vimpat to 200 mg twice daily and Keppra 2000 mg twice daily -Continue video EEG to monitor for intermittent seizures -If any further seizures, can add topamax or increase gabapentin -Continue seizure precautions -As needed IV Ativan for clinical seizures lasting more than 5 minutes   I have spent a total of  36 minutes with the patient reviewing hospital notes,  test results, labs and examining the patient as well  as establishing an assessment and plan.  > 50% of time was spent in direct patient care.      Tyler [name] Epilepsy [facility] For questions after 5pm please refer to AMION to reach the Neurologist on call

## 2023-05-05 NOTE — Progress Notes (Signed)
 Tyler [name]   DOB:[birth year]   ZO#:[id number]      ASSESSMENT & PLAN:  1.  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  Had shown significant improvements.     - On 04/25/2023 pt seen with likely partial seizure.  Rapid response was called and appropriate medications administered and patient intubated. - Transferred from [facility] to [facility].  On video EEG monitoring for seizures.   - On antiseizure meds and steroids.    Continue seizure precautions. - MRI brain 04/24/2023 in the AM showed marked interval decrease in right parietal mass persistent with response to therapy.  However new diffusion signal abnormality involving right insular cortex and overlying right frontal favored to reflect sequelae of seizure. - If seizure control improves with improvement of his tumors, can restart additional 2 HD Methotrexate treatments.   - If his functional status does not allow additional HD MTX, we would consult rad onc to control the tumor with RT.   - Family meeting held on 05/01/2023.  Family opted to continue with seizure control as outlined by Neuro Dr. Melynda Ripple and continue with next cycle of chemotherapy as available. - Patient was intubated due to respiratory failure status post 3-minute seizure on 05/03/2023.  Per EEG average duration of seizures showing 2 to 6 minutes. -Remains intubated at this time.  Tube feeding via NG tube intact.  Continuous EEG monitoring ongoing. - Critical care and neuro teams following - Medical Oncology/Dr. Candise Che following closely.   2.  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   S/p chemotherapy R-CHOP.  S/p craniotomy with resection 03/10/23.  - CT head 04/07/2023 showed interval increase in size of tumor at the craniotomy site.  MRI head 04/07/2023 increase in size of tumor with slightly increased surrounding edema. - S/P C2  Hi-dose methotrexate chemo regimen. Completed 04/23/2023. - S/P daily monitoring of Urine pH and methotrexate levels. S/P IV leucovorin rescue. - Next cycle due 05/07/2023.  Determination for continuation of cycle 3 will be made by Dr. Candise Che. - On hold: Outpatient IV Rituxan between 2nd and 3rd cycle of methotrexate and subsequently between treatment cycles until acute phase resolves. - Consideration for brain radiation for local control.  - Continue daily labs - Medical oncology/Dr. Candise Che following closely.     3.  Pancytopenia: Thrombocytopenia, Anemia, Leukopenia - Mild, likely due to recent chemotherapy treatment - platelets 151k today - Hemoglobin 12.3, stable - WBC 3.3 today, stable - No intervention required at this time - Monitor CBC with differential    4.  Elevated blood glucose  - Monitor blood sugar levels closely.  Stable 89 today. -Primary team following    5.  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  A-fib - S/p Eliquis, d/c'd due to high risk for bleeding  - Follow closely with cardiology   7.  Prostate cancer - Diagnosed 2021   8.  Hep B antibody positive, chronic - On tenofovir for hep B reactivation suppression in context of Rituxan use, continue via G-tube. - Plan is to continue for 6 months after his last chemotherapy. - Follow ID outpatient   9.  VTE prophylaxis - With Lovenox   10.  Fever - Temperature 99.1-100.5 today - May be related to seizure, meds, infection. - Management per critical care team -  Monitor fever curve      Code Status Full  Subjective:  Patient seen intubated at this time.  Remains on continuous video EEG monitoring.  Tube feeding via NG tube is ongoing.  Objective:  Vitals:   05/05/23 1121 05/05/23 1200  BP:  (!) 145/90  Pulse: 80 79  Resp: (!) 26 (!) 29  Temp:  (!) 100.5 F (38.1 C)  SpO2: 95% 96%     Intake/Output Summary (Last 24 hours) at 05/05/2023  1252 Last data filed at 05/05/2023 1200 Gross per 24 hour  Intake 1681.99 ml  Output 1450 ml  Net 231.99 ml     REVIEW OF SYSTEMS: Unable to obtain, patient is intubated   PHYSICAL EXAMINATION: ECOG PERFORMANCE STATUS: 4 - Bedbound  Vitals:   05/05/23 1121 05/05/23 1200  BP:  (!) 145/90  Pulse: 80 79  Resp: (!) 26 (!) 29  Temp:  (!) 100.5 F (38.1 C)  SpO2: 95% 96%   Filed Weights   04/29/23 0450 04/30/23 0500 05/05/23 0500  Weight: 196 lb 6.9 oz (89.1 kg) 198 lb 10.2 oz (90.1 kg) 186 lb 4.6 oz (84.5 kg)    GENERAL: + Intubated + sedated  SKIN: skin color, texture, turgor are normal, no rashes or significant lesions EYES: normal, conjunctiva are pink and non-injected, sclera clear OROPHARYNX: no exudate, no erythema and lips, buccal mucosa, and tongue normal  NECK: supple, thyroid normal size, non-tender, without nodularity LYMPH: no palpable lymphadenopathy in the cervical, axillary or inguinal LUNGS: clear to auscultation and percussion with normal breathing effort HEART: regular rate & rhythm and no murmurs and no lower extremity edema ABDOMEN: abdomen soft, non-tender and normal bowel sounds MUSCULOSKELETAL: no cyanosis of digits and no clubbing  PSYCH: + Intubated NEURO: no focal motor/sensory deficits   All questions were answered. The patient knows to call the clinic with any problems, questions or concerns.   The total time spent in the appointment was 40 minutes encounter with patient including review of chart and various tests results, discussions about plan of care and coordination of care plan  Dawson Bills, NP 05/05/2023 12:52 PM    Labs Reviewed:  Lab Results  Component Value Date   WBC 3.1 (L) 05/05/2023   HGB 11.5 (L) 05/05/2023   HCT 35.0 (L) 05/05/2023   MCV 91.6 05/05/2023   PLT 153 05/05/2023   Recent Labs    08/11/22 0316 08/11/22 1544 08/12/22 0515 08/12/22 1400 08/20/22 0302 08/21/22 0334 04/26/23 0500 04/27/23 0432  04/28/23 1120 04/29/23 0432 05/03/23 0500 05/03/23 1118 05/04/23 1133 05/05/23 0500  NA 134*   < > 137   < > 137   < > 138 140 138   < > 133* 134* 135 135  K 3.7   < > 3.3*   < > 4.0   < > 3.3* 3.6 4.2   < > 4.0 4.0 3.9 3.9  CL 99   < > 101   < > 102   < > 97* 104 100   < > 99  --  101 102  CO2 25   < > 28   < > 24   < > 30 28 29    < > 25  --  26 24  GLUCOSE 185*   < > 114*   < > 78   < > 149* 112* 105*   < > 127*  --  125* 118*  BUN 52*   < > 41*   < >  45*   < > 21 26* 18   < > 27*  --  27* 26*  CREATININE 1.58*   < > 1.44*   < > 3.38*   < > 1.08 0.93 1.07   < > 0.86  --  0.79 0.70  CALCIUM 7.4*   < > 7.5*   < > 7.2*   < > 8.0* 8.0* 8.2*   < > 8.0*  --  8.1* 7.9*  GFRNONAA 47*   < > 52*   < > 19*   < > >60 >60 >60   < > >60  --  >60 >60  PROT 4.6*  --  4.2*  --  3.9*   < > 5.2* 4.9* 5.1*  --   --   --   --   --   ALBUMIN 1.8*  1.7*   < > 1.7*  1.6*   < > 1.7*   < > 2.9* 2.7* 2.7*  --   --   --   --   --   AST 33  --  31  --  25   < > 29 20 21   --   --   --   --   --   ALT 31  --  31  --  18   < > 44 33 34  --   --   --   --   --   ALKPHOS 84  --  79  --  111   < > 61 54 51  --   --   --   --   --   BILITOT 3.2*  --  2.5*  --  0.8   < > 1.3* 0.9 1.1  --   --   --   --   --   BILIDIR 1.7*  --  1.3*  --  0.2  --   --   --   --   --   --   --   --   --   IBILI 1.5*  --  1.2*  --  0.6  --   --   --   --   --   --   --   --   --    < > = values in this interval not displayed.    Studies Reviewed:  NM PET Image Restag (PS) Skull Base To Thigh Result Date: 05/05/2023 CLINICAL DATA:  Subsequent treatment strategy for large B-cell lymphoma. EXAM: NUCLEAR MEDICINE PET SKULL BASE TO THIGH TECHNIQUE: 9.1 mCi F-18 FDG was injected intravenously. Full-ring PET imaging was performed from the skull base to thigh after the radiotracer. CT data was obtained and used for attenuation correction and anatomic localization. Fasting blood glucose: 98 mg/dl COMPARISON:  CT abdomen pelvis 03/07/2023 and PET  12/27/2022. FINDINGS: Mediastinal blood pool activity: SUV max 2.5 Liver activity: SUV max 4.1 NECK: Focal hypermetabolism along the high left mandible or deep left parotid gland, SUV max 5.3, unchanged. No definite CT correlate. Right level III lymph node measures 7 mm (4/39), SUV max 2.0, minimally decreased from 3.7. Incidental CT findings: None. CHEST: Axillary lymph nodes have decreased in hypermetabolism. Index 8 mm left axillary lymph node (4/54), SUV max 1.3, previously 2.7. No new abnormal hypermetabolism. Incidental CT findings: Right IJ Port-A-Cath terminates in the right atrium. Heart is enlarged. No pericardial or pleural effusion. ABDOMEN/PELVIS: No abnormal hypermetabolism. Incidental CT findings: Probable tiny left hepatic lobe cyst. No specific follow-up necessary. Low-attenuation  lesions in the kidneys. No specific follow-up necessary. Soft tissue thickening and stranding along the posterior retroperitoneum bilaterally, new in the interval but nonspecific. Left inguinal hernia repair. SKELETON: 1.5 cm lucent lesion posterior to the left acetabulum (4/175), SUV max 2.5, unchanged. No additional abnormal hypermetabolism. Incidental CT findings: Degenerative changes in the spine. IMPRESSION: 1. Interval response to therapy as evidenced by decrease in hypermetabolism involving cervical and axillary lymph nodes (Deauville 2). 2. Small lucent lesion posterior to the left acetabulum with associated borderline hypermetabolism, unchanged and nonspecific. Recommend attention on follow-up. 3. Minimal residual atelectasis or scarring in the posteromedial right lower lobe, improved from 12/27/2022. Electronically Signed   By: Leanna Battles M.D.   On: 05/05/2023 10:31   DG CHEST PORT 1 VIEW Result Date: 05/03/2023 CLINICAL DATA:  73 year old male intubated. EXAM: PORTABLE CHEST 1 VIEW COMPARISON:  Portable chest yesterday. FINDINGS: Portable AP upright view at 0923 hours. Mildly rotated to the right.  Endotracheal tube tip in good position between the clavicles and carina. Enteric feeding tube courses to the left abdomen, tip not included. Stable right chest power port. Stable cardiac size and mediastinal contours. Stable lung volumes and ventilation. No pneumothorax, pulmonary edema, consolidation. Negative visible bowel gas. IMPRESSION: 1. Satisfactory endotracheal tube placement. Enteric feeding tube remains in place. 2.  No convincing acute cardiopulmonary abnormality. Electronically Signed   By: Odessa Fleming M.D.   On: 05/03/2023 12:57   DG Chest Port 1 View Result Date: 05/02/2023 CLINICAL DATA:  Shortness of breath.  History of lymphoma. EXAM: PORTABLE CHEST 1 VIEW COMPARISON:  Radiograph yesterday.  Chest CT 04/26/2023 FINDINGS: Right chest port in place. Weighted enteric tube tip below the diaphragm. Lung volumes are low. Stable heart size and mediastinal contours. Bibasilar atelectasis, left greater than right. No new airspace disease. No pleural fluid or pneumothorax. No pulmonary edema. IMPRESSION: Hypoventilatory chest with bibasilar atelectasis. Electronically Signed   By: Narda Rutherford M.D.   On: 05/02/2023 21:50   DG CHEST PORT 1 VIEW Result Date: 05/01/2023 CLINICAL DATA:  Fever EXAM: PORTABLE CHEST 1 VIEW COMPARISON:  04/27/2023 FINDINGS: Cardiomegaly. Right Port-A-Cath remains in place, unchanged. No confluent airspace opacities or effusions. No acute bony abnormality. IMPRESSION: No active disease. Electronically Signed   By: Charlett Nose M.D.   On: 05/01/2023 12:23   DG Swallowing Func-Speech Pathology Result Date: 04/28/2023 Table formatting from the original result was not included. Modified Barium Swallow Study Patient Details Name: Schawn Byas MRN: [id number] Date of Birth: 1950/03/18 Today's Date: 04/28/2023 HPI/PMH: HPI: 73 yo male [facility] on 2/[facility] with LUE and LE weakness, and change in mentation. Head CT of brain shows a recurrent tumor at the craniotomy site and increased  size of recurrent tumor along with a left midline shift up to 6 mm. 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 Pt's swallowing was evaluated when he was admitted in June of 2024 - he was D/Cd on a dysphagia 3 diet with thin liquids with no f/u recommended. Pt's swallowing assessed again on 04/08/23 this admission with recommendation of a regular diet and thin liquids. Hospital course complicated by concern for neck swelling, acute dysphagia, and concern for airway protection prompting intubation from 02/28-03/02. Neck CT on 04/26/23: demonstrated "No acute finding. Assessment of mucosal surfaces limited by airway  collapse in  the setting of intubation.". Clinical Impression: Pt presents with a moderate-severe oral dysphagia and a mild pharyngeal dysphagia per results of MBSS completed today. Oral deficits included poor bolus awareness, oral holding, reduced labial seal resulting in anterior spillage, poor lingual movement or initiation for oral transit, and reduced oral coordination resulting in posterior loss of trials. Oral deficits were more pronounced with nectar-thick liquid, puree, and soft solid trials. Soft solid trial was removed from oral cavity due to minimal-no mastication efforts.  Pt required multiple verbal and tactile cues to eventually initiate oral transit of nectar-thick and puree trials. Tactile cues included downward pressure of spoon on tongue multiple times. Pharyngeal dysphagia characterized by reduced hyolaryngeal elevation, brief mistiming of pharyngeal swallow initiation, reduced base of tongue retraction, reduced laryngeal vestibule closure, and reduced pharyngeal stripping. Findings: -There was x1 instance of trace-min silent aspiration of thin liquids (via straw)  before the swallow. Aspiration occurred posteriorly from the pyriform sinuses due to pooling of liquids before swallow was initiated. Aspiration event was completely silent without cough, water eyes, increased RR, or any other subtle s/s of aspiration. -There was intermittent, trace, shallow, transient penetration of nectar-thick and thin liquids by cup. -There was shallow penetration without ejection of mixture of thin and nectar-thick residue towards end of study. Penetrated residue appeared to move slowly towards the airway, though remained above the vocal cords during assessment. Pt unable to follow directions for strong cough to attempt to clear residue. Diet recommendations outlined below. SLP will follow up for ongoing diet tolerance assessment and therapeutic PO trials of pureed solids to address oral deficits. Recommend continue tube feeds via Cortrak for nutritional support and medication delivery. Factors that may increase risk of adverse event in presence of aspiration Rubye Oaks & Clearance Coots 2021): Factors that may increase risk of adverse event in presence of aspiration Rubye Oaks & Clearance Coots 2021): Reduced cognitive function; Presence of tubes (ETT, trach, NG, etc.); Weak cough; Dependence for feeding and/or oral hygiene; Inadequate oral hygiene Recommendations/Plan: Swallowing Evaluation Recommendations Swallowing Evaluation Recommendations Recommendations: PO diet PO Diet Recommendation: Clear liquid diet; Thin liquids (Level 0) (ok for any thin liquids) Liquid Administration via: Cup; No straw Medication Administration: Via alternative means Supervision: Staff to assist with self-feeding; Full supervision/cueing for swallowing strategies Swallowing strategies  : Minimize environmental distractions; Small bites/sips; Check for anterior loss Postural changes: Position pt fully upright for meals; Stay upright 30-60 min after meals Oral care recommendations: Oral care QID (4x/day); Staff/trained caregiver to  provide oral care; Use suctioning for oral care Caregiver Recommendations: Have oral suction available Treatment Plan Treatment Plan Treatment recommendations: Therapy as outlined in treatment plan below Follow-up recommendations: Follow physicians's recommendations for discharge plan and follow up therapies (SLP services at next level of care) Functional status assessment: Patient has had a recent decline in their functional status and demonstrates the ability to make significant improvements in function in a reasonable and predictable amount of time. Treatment frequency: Min 2x/week Treatment duration: 2 weeks Interventions: Aspiration precaution training; Compensatory techniques; Patient/family education; Trials of upgraded texture/liquids; Diet toleration management by SLP; Oropharyngeal exercises Recommendations Recommendations for follow up therapy are one component of a multi-disciplinary discharge planning process, led by the attending physician.  Recommendations may be updated based on patient status, additional functional criteria and insurance authorization. Assessment: Orofacial Exam: Orofacial Exam Oral Cavity: Oral Hygiene: WFL Oral Cavity - Dentition: Adequate natural dentition Orofacial Anatomy: WFL Oral Motor/Sensory Function: Suspected cranial nerve impairment (limited assessment, though concern for oral apraxia) Anatomy: Anatomy:  WFL Boluses Administered: Boluses Administered Boluses Administered: Mildly thick liquids (Level 2, nectar thick); Thin liquids (Level 0); Puree; Solid  Oral Impairment Domain: Oral Impairment Domain Lip Closure: Escape beyond mid-chin Tongue control during bolus hold: Posterior escape of greater than half of bolus Bolus preparation/mastication: Minimal chewing/mashing with majority of bolus unchewed (removed from oral cavity) Bolus transport/lingual motion: Minimal-no tongue motion Oral residue: Majority of bolus remaining Location of oral residue : Floor of mouth;  Tongue Initiation of pharyngeal swallow : Pyriform sinuses  Pharyngeal Impairment Domain: Pharyngeal Impairment Domain Soft palate elevation: No bolus between soft palate (SP)/pharyngeal wall (PW) Laryngeal elevation: Partial superior movement of thyroid cartilage/partial approximation of arytenoids to epiglottic petiole Anterior hyoid excursion: Complete anterior movement Epiglottic movement: Complete inversion Laryngeal vestibule closure: Incomplete, narrow column air/contrast in laryngeal vestibule Pharyngeal stripping wave : Present - diminished Pharyngeal contraction (A/P view only): N/A Pharyngoesophageal segment opening: Complete distension and complete duration, no obstruction of flow Tongue base retraction: Trace column of contrast or air between tongue base and PPW Pharyngeal residue: Collection of residue within or on pharyngeal structures Location of pharyngeal residue: Tongue base; Valleculae  Esophageal Impairment Domain: No data recorded Pill: No data recorded Penetration/Aspiration Scale Score: Penetration/Aspiration Scale Score 1.  Material does not enter airway: Thin liquids (Level 0); Mildly thick liquids (Level 2, nectar thick); Puree 2.  Material enters airway, remains ABOVE vocal cords then ejected out: Thin liquids (Level 0); Mildly thick liquids (Level 2, nectar thick) 3.  Material enters airway, remains ABOVE vocal cords and not ejected out: Thin liquids (Level 0); Mildly thick liquids (Level 2, nectar thick) 8.  Material enters airway, passes BELOW cords without attempt by patient to eject out (silent aspiration) : Thin liquids (Level 0) Compensatory Strategies: Compensatory Strategies Compensatory strategies: Yes Straw: Ineffective Ineffective Straw: Thin liquid (Level 0) Other(comment): Ineffective (tactile pressure with spoon in effort to initiate oral transit) Ineffective Other(comment): Mildly thick liquid (Level 2, nectar thick); Moderately thick liquid (Level 3, honey thick)    General Information: Caregiver present: No (follow up education completed on phone with daughter)  Diet Prior to this Study: NPO; Cortrak/Small bore NG tube   Temperature : Febrile   Respiratory Status: WFL   Supplemental O2: Nasal cannula (2L)   History of Recent Intubation: Yes  Behavior/Cognition: Alert; Cooperative; Pleasant mood Self-Feeding Abilities: Needs set-up for self-feeding Baseline vocal quality/speech: -- (intermittent vocal wetness) Volitional Cough: Unable to elicit Volitional Swallow: Unable to elicit Exam Limitations: No limitations Goal Planning: Prognosis for improved oropharyngeal function: Fair Barriers to Reach Goals: Cognitive deficits No data recorded Patient/Family Stated Goal: drink water Consulted and agree with results and recommendations: Patient; Family member/caregiver; Nurse Pain: Pain Assessment Pain Assessment: No/denies pain Breathing: 0 Negative Vocalization: 0 Facial Expression: 0 Body Language: 1 Consolability: 0 PAINAD Score: 1 Facial Expression: 0 Body Movements: 0 Muscle Tension: 0 Compliance with ventilator (intubated pts.): N/A Vocalization (extubated pts.): 0 CPOT Total: 0 End of Session: Start Time:SLP Start Time (ACUTE ONLY): 0900 Stop Time: SLP Stop Time (ACUTE ONLY): 0930 Time Calculation:SLP Time Calculation (min) (ACUTE ONLY): 30 min Charges: SLP Evaluations $ SLP Speech Visit: 1 Visit SLP Evaluations $BSS Swallow: 1 Procedure $MBS Swallow: 1 Procedure SLP visit diagnosis: SLP Visit Diagnosis: Dysphagia, oropharyngeal phase (R13.12) Past Medical History: Past Medical History: Diagnosis Date  Cancer (HCC)   prostate  Follicular lymphoma (HCC) 2024  History of pulmonary embolism   Hypertension   Myelodysplastic syndrome (HCC)   PAF (paroxysmal atrial fibrillation) (HCC)  Recurrent UTI  Past Surgical History: Past Surgical History: Procedure Laterality Date  APPLICATION OF CRANIAL NAVIGATION Right 03/10/2023  Procedure: APPLICATION OF CRANIAL NAVIGATION;  Surgeon:  Tressie Stalker, MD;  Location: [facility] OR;  Service: Neurosurgery;  Laterality: Right;  BIOPSY  08/21/2022  Procedure: BIOPSY;  Surgeon: Beverley Fiedler, MD;  Location: [facility] ENDOSCOPY;  Service: Gastroenterology;;  CRANIOTOMY Right 03/10/2023  Procedure: PARIETAL CRANIOTOMY;  Surgeon: Tressie Stalker, MD;  Location: [facility] OR;  Service: Neurosurgery;  Laterality: Right;  ESOPHAGOGASTRODUODENOSCOPY (EGD) WITH PROPOFOL N/A 08/21/2022  Procedure: ESOPHAGOGASTRODUODENOSCOPY (EGD) WITH PROPOFOL;  Surgeon: Beverley Fiedler, MD;  Location: [facility] ENDOSCOPY;  Service: Gastroenterology;  Laterality: N/A;  IR IMAGING GUIDED PORT INSERTION  08/26/2022  LYMPHADENECTOMY Bilateral 09/01/2019  Procedure: LYMPHADENECTOMY;  Surgeon: Sebastian Ache, MD;  Location: [facility] ORS;  Service: Urology;  Laterality: Bilateral;  ROBOT ASSISTED LAPAROSCOPIC RADICAL PROSTATECTOMY N/A 09/01/2019  Procedure: XI ROBOTIC ASSISTED LAPAROSCOPIC RADICAL PROSTATECTOMY;  Surgeon: Sebastian Ache, MD;  Location: [facility] ORS;  Service: Urology;  Laterality: N/A;  3 HRS [name] 04/28/2023, 11:06 AM  Overnight EEG with video Result Date: 04/28/2023 Charlsie Quest, MD     04/29/2023 10:44 AM Patient Name: Jarvin Ogren MRN: [id number] Epilepsy Attending: Charlsie Quest Referring Physician/Provider: Gordy Councilman, MD Duration: 04/27/2023 1608 to 04/28/2023 1608 Patient history: 73yo M patient with 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 EEG to evaluate for seizure Level of alertness: Awake/lethargic AEDs during EEG study: LEV, LCM Technical aspects: This EEG study was done with scalp electrodes positioned according to the 10-20 International system of electrode placement. Electrical activity was reviewed with band pass filter of 1-70Hz , sensitivity of 7 uV/mm, display speed of 54mm/sec with a 60Hz  notched filter applied as appropriate. EEG data were recorded continuously and digitally stored.  Video monitoring was available and reviewed as appropriate. Description:  No clear posterior dominant rhythm was seen.  EEG showed continuous generalized and maximal right parietal 3 to 6 Hz theta-delta slowing. Lateralized periodic discharges were noted in right hemisphere, maximal right parietal region at 1 Hz, at times with overlying rhythmicity. Seizures without clinical signs were noted arising from right parietal region, average 5 seizures per hour, lasting 1 to 2 minutes each.  During seizure EEG showed 5 to 6 Hz theta slowing admixed with sharp waves and right parietal region which then involved all of right hemisphere and evolved into 2 to 3 Hz delta slowing.  IV Vimpat was administered on 04/28/2023 at around 1042. Gradually after around 1300, EEG improved and showed lateralized periodic discharges in right hemisphere, maximal right parietal region at 1hz  with overlying rhythmicity.  Hyperventilation and photic stimulation were not performed.   ABNORMALITY -Seizure without clinical signs, right parietal region -Lateralized periodic discharges with overlying rhythmicity, right hemisphere, maximal right parietal  region ( LPD+R) -Continuous slow, generalized and maximal right parietal region IMPRESSION: This study showed seizures without clinical signs arising from right parietal region, average 5 seizures per hour lasting 1 to 2 minutes each.  IV Vimpat was administered on 04/28/2023 at around 1042.  Gradually after around 1300, EEG improved and no definite seizures were noted. Additionally there is evidence of epileptogenicity and cortical dysfunction arising from right hemisphere, maximal right parietal region likely secondary to underlying structural abnormality.  Charlsie Quest   DG CHEST PORT 1 VIEW Result Date: 04/27/2023 CLINICAL DATA:  Acute hypoxemic respiratory failure. EXAM: PORTABLE CHEST 1 VIEW COMPARISON:  Chest radiograph dated 04/26/2023. FINDINGS: The heart is enlarged. An  endotracheal tube terminates in the midthoracic trachea. An enteric tube enters the stomach  and terminates below the field of view. A right internal jugular central venous port catheter tip overlies the right atrium. There is mild bibasilar atelectasis/airspace disease, similar to prior exam. IMPRESSION: Mild bibasilar atelectasis/airspace disease, similar to prior exam. Electronically Signed   By: Romona Curls M.D.   On: 04/27/2023 14:57   ECHOCARDIOGRAM COMPLETE Result Date: 04/26/2023    ECHOCARDIOGRAM REPORT   Patient Name:   MATE ALEGRIA Date of Exam: 04/26/2023 Medical Rec #:  [id number]    Height:       67.0 in Accession #:    [id number]   Weight:       184.1 lb Date of Birth:  02-Mar-1950    BSA:          1.952 m Patient Age:    72 years     BP:           151/88 mmHg Patient Gender: M            HR:           48 bpm. Exam Location:  Inpatient Procedure: 2D Echo, Color Doppler and Cardiac Doppler (Both Spectral and Color            Flow Doppler were utilized during procedure). Indications:    Resp Distress  History:        Patient has prior history of Echocardiogram examinations, most                 recent 10/03/2022. Chemo; Risk Factors:Hypertension.  Sonographer:    Amy Chionchio Referring Phys: [id number] MURALI RAMASWAMY IMPRESSIONS  1. Left ventricular ejection fraction, by estimation, is 60 to 65%. The left ventricle has normal function. The left ventricle has no regional wall motion abnormalities. There is mild left ventricular hypertrophy. Left ventricular diastolic parameters are indeterminate.  2. Right ventricular systolic function is normal. The right ventricular size is normal. Tricuspid regurgitation signal is inadequate for assessing PA pressure.  3. The mitral valve is normal in structure. Trivial mitral valve regurgitation. No evidence of mitral stenosis.  4. The aortic valve is tricuspid. Aortic valve regurgitation is not visualized. No aortic stenosis is present.  5. The inferior vena cava is dilated in size with <50% respiratory variability, suggesting right atrial pressure of 15 mmHg.  FINDINGS  Left Ventricle: Left ventricular ejection fraction, by estimation, is 60 to 65%. The left ventricle has normal function. The left ventricle has no regional wall motion abnormalities. Strain imaging was not performed. The left ventricular internal cavity  size was normal in size. There is mild left ventricular hypertrophy. Left ventricular diastolic parameters are indeterminate. Right Ventricle: The right ventricular size is normal. No increase in right ventricular wall thickness. Right ventricular systolic function is normal. Tricuspid regurgitation signal is inadequate for assessing PA pressure. Left Atrium: Left atrial size was normal in size. Right Atrium: Right atrial size was normal in size. Pericardium: There is no evidence of pericardial effusion. Mitral Valve: The mitral valve is normal in structure. Trivial mitral valve regurgitation. No evidence of mitral valve stenosis. MV peak gradient, 2.4 mmHg. The mean mitral valve gradient is 1.0 mmHg. Tricuspid Valve: The tricuspid valve is normal in structure. Tricuspid valve regurgitation is trivial. Aortic Valve: The aortic valve is tricuspid. Aortic valve regurgitation is not visualized. No aortic stenosis is present. Aortic valve mean gradient measures 3.0 mmHg. Aortic valve peak gradient measures 5.7 mmHg. Aortic valve area,  by VTI measures 2.11 cm. Pulmonic Valve: The pulmonic valve was grossly normal. Pulmonic valve regurgitation is not visualized. Aorta: The aortic root and ascending aorta are structurally normal, with no evidence of dilitation. Venous: The inferior vena cava is dilated in size with less than 50% respiratory variability, suggesting right atrial pressure of 15 mmHg. IAS/Shunts: The interatrial septum was not well visualized. Additional Comments: 3D imaging was not performed.  LEFT VENTRICLE PLAX 2D LVIDd:         4.20 cm     Diastology LVIDs:         2.70 cm     LV e' medial:    5.11 cm/s LV PW:         1.10 cm     LV E/e' medial:   14.0 LV IVS:        0.90 cm     LV e' lateral:   7.94 cm/s LVOT diam:     2.20 cm     LV E/e' lateral: 9.0 LV SV:         58 LV SV Index:   30 LVOT Area:     3.80 cm  LV Volumes (MOD) LV vol d, MOD A2C: 69.9 ml LV vol d, MOD A4C: 90.8 ml LV vol s, MOD A2C: 23.8 ml LV vol s, MOD A4C: 29.9 ml LV SV MOD A2C:     46.1 ml LV SV MOD A4C:     90.8 ml LV SV MOD BP:      53.6 ml RIGHT VENTRICLE             IVC RV Basal diam:  3.10 cm     IVC diam: 2.20 cm RV S prime:     14.90 cm/s TAPSE (M-mode): 1.7 cm LEFT ATRIUM             Index        RIGHT ATRIUM          Index LA Vol (A2C):   31.3 ml 16.03 ml/m  RA Area:     9.52 cm LA Vol (A4C):   54.0 ml 27.66 ml/m  RA Volume:   14.30 ml 7.32 ml/m LA Biplane Vol: 43.6 ml 22.33 ml/m  AORTIC VALVE                    PULMONIC VALVE AV Area (Vmax):    2.48 cm     PV Vmax:       0.77 m/s AV Area (Vmean):   2.33 cm     PV Peak grad:  2.4 mmHg AV Area (VTI):     2.11 cm AV Vmax:           119.00 cm/s AV Vmean:          78.400 cm/s AV VTI:            0.275 m AV Peak Grad:      5.7 mmHg AV Mean Grad:      3.0 mmHg LVOT Vmax:         77.50 cm/s LVOT Vmean:        48.100 cm/s LVOT VTI:          0.153 m LVOT/AV VTI ratio: 0.56  AORTA Ao Root diam: 3.30 cm Ao Asc diam:  3.40 cm MITRAL VALVE MV Area (PHT): 2.70 cm    SHUNTS MV Area VTI:   2.10 cm    Systemic VTI:  0.15 m MV Peak grad:  2.4  mmHg    Systemic Diam: 2.20 cm MV Mean grad:  1.0 mmHg MV Vmax:       0.77 m/s MV Vmean:      35.3 cm/s MV Decel Time: 281 msec MV E velocity: 71.70 cm/s MV A velocity: 83.10 cm/s MV E/A ratio:  0.86 Epifanio Lesches MD Electronically signed by Epifanio Lesches MD Signature Date/Time: 04/26/2023/2:18:08 PM    Final    DG CHEST PORT 1 VIEW Result Date: 04/26/2023 CLINICAL DATA:  Endotracheal tube present. EXAM: PORTABLE CHEST 1 VIEW COMPARISON:  Radiograph yesterday.  CT earlier today FINDINGS: Endotracheal tube tip 4.6 cm from the carina. Weighted enteric tube tip below the diaphragm not  included in the field of view. Right chest port unchanged in position. Stable heart size and mediastinal contours. Left lung base opacity has progressed from yesterday's radiograph, subtotal lower lobe collapse on CT. Additional streaky atelectasis in the left mid lung. Right lower lobe opacity on CT has no definite radiographic correlate. No pneumothorax or large pleural effusion. IMPRESSION: 1. Endotracheal tube tip 4.6 cm from the carina. 2. Left lung base opacity has progressed from yesterday's radiograph, subtotal lower lobe collapse on this morning's CT. Additional streaky atelectasis in the left mid lung. Electronically Signed   By: Narda Rutherford M.D.   On: 04/26/2023 13:27   CT CHEST W CONTRAST Result Date: 04/26/2023 CLINICAL DATA:  Head neck cancer.  Airway compromise with intubation EXAM: CT CHEST WITH CONTRAST TECHNIQUE: Multidetector CT imaging of the chest was performed during intravenous contrast administration. RADIATION DOSE REDUCTION: This exam was performed according to the departmental dose-optimization program which includes automated exposure control, adjustment of the mA and/or kV according to patient size and/or use of iterative reconstruction technique. CONTRAST:  OMNIPAQUE IOHEXOL 300 MG/ML  SOLN COMPARISON:  Head CT 04/16/2023 FINDINGS: Cardiovascular: Normal heart size. No pericardial effusion. Atheromatous calcification of the coronary arteries. Porta catheter with tip at the upper right atrium. Mediastinum/Nodes: Located endotracheal and enteric tube. No mass or adenopathy. Lungs/Pleura: Multi segment opacification in the lower lobes with volume loss, worse on the left. No consolidation, edema, effusion, or pneumothorax. Upper Abdomen: Enteric tube with tip at the pylorus. No acute finding Musculoskeletal: Thoracic spondylosis.  No acute finding IMPRESSION: Multi segment atelectasis in the lower lobes Electronically Signed   By: Tiburcio Pea M.D.   On: 04/26/2023 06:31    CT HEAD WO CONTRAST ( ) Result Date: 04/26/2023 CLINICAL DATA:  Mental status change.  History of CNS lymphoma. EXAM: CT HEAD WITHOUT CONTRAST TECHNIQUE: Contiguous axial images were obtained from the base of the skull through the vertex without intravenous contrast. RADIATION DOSE REDUCTION: This exam was performed according to the departmental dose-optimization program which includes automated exposure control, adjustment of the mA and/or kV according to patient size and/or use of iterative reconstruction technique. COMPARISON:  Brain MRI 04/24/2023 FINDINGS: Brain: Cortical edema at the right insula correlating with abnormality on prior brain MRI. Small area of blood products in the parasagittal right parietal lobe with adjacent swelling, operative site. No evidence of interval infarct, hydrocephalus, or collection. Vascular: No hyperdense vessel or unexpected calcification. Skull: Unremarkable right parietal craniotomy site Sinuses/Orbits: Negative IMPRESSION: No acute finding when compared to 04/24/2023 brain MRI. Electronically Signed   By: Tiburcio Pea M.D.   On: 04/26/2023 05:09   CT SOFT TISSUE NECK W CONTRAST Result Date: 04/26/2023 CLINICAL DATA:  Soft tissue infection suspected. Neck swelling and airway compromise EXAM: CT NECK WITH CONTRAST TECHNIQUE: Multidetector CT  imaging of the neck was performed using the standard protocol following the bolus administration of intravenous contrast. RADIATION DOSE REDUCTION: This exam was performed according to the departmental dose-optimization program which includes automated exposure control, adjustment of the mA and/or kV according to patient size and/or use of iterative reconstruction technique. CONTRAST:  OMNIPAQUE IOHEXOL 300 MG/ML  SOLN COMPARISON:  04/16/2023 head CT FINDINGS: Pharynx and larynx: The airway is collapsed around endotracheal tube and enteric tube with fluid layering in the nasopharynx. This limits assessment of the mucosal  surfaces, no evidence of submucosal edema. Salivary glands: No inflammation, mass, or stone. Thyroid: Normal. Lymph nodes: None enlarged or heterogeneous. Vascular: No acute finding Limited intracranial: Negative Visualized orbits: Negative Mastoids and visualized paranasal sinuses: Clear Skeleton: No acute or aggressive finding. Upper chest: Clear apical lungs. IMPRESSION: No acute finding. Assessment of mucosal surfaces limited by airway collapse in the setting of intubation. Electronically Signed   By: Tiburcio Pea M.D.   On: 04/26/2023 05:03   DG Abd 1 View Result Date: 04/26/2023 CLINICAL DATA:  Check gastric catheter placement EXAM: ABDOMEN - 1 VIEW COMPARISON:  None Available. FINDINGS: Weighted feeding catheter is noted in the distal stomach directed towards the pole or is. No free air is seen. No obstructive changes are noted. IMPRESSION: Feeding catheter as described. Electronically Signed   By: Alcide Clever M.D.   On: 04/26/2023 02:11   DG CHEST PORT 1 VIEW Result Date: 04/25/2023 CLINICAL DATA:  Check endotracheal tube placement EXAM: PORTABLE CHEST 1 VIEW COMPARISON:  Film from earlier in the same day. FINDINGS: Endotracheal tube is noted 5.3 cm above the carina. Gastric catheter extends into the stomach. Right chest wall port is again seen. Cardiac shadow is stable. No focal infiltrate is noted. Mild basilar atelectasis is seen. IMPRESSION: Tubes and lines as described. Electronically Signed   By: Alcide Clever M.D.   On: 04/25/2023 21:44   DG Chest Port 1 View Result Date: 04/25/2023 CLINICAL DATA:  Altered mental status EXAM: PORTABLE CHEST 1 VIEW COMPARISON:  04/10/2023 FINDINGS: Cardiac shadow is enlarged but stable. Right chest wall port is again seen. Mild basilar atelectasis is seen. No bony abnormality is noted. IMPRESSION: Mild basilar atelectasis. Electronically Signed   By: Alcide Clever M.D.   On: 04/25/2023 20:59   MR BRAIN W WO CONTRAST Result Date: 04/25/2023 CLINICAL DATA:   Initial evaluation for brain/CNS neoplasm, assess treatment response. History of B-cell lymphoma EXAM: MRI HEAD WITHOUT AND WITH CONTRAST TECHNIQUE: Multiplanar, multiecho pulse sequences of the brain and surrounding structures were obtained without and with intravenous contrast. CONTRAST:  8mL GADAVIST GADOBUTROL 1 MMOL/ML IV SOLN COMPARISON:  Comparison made with previous MRI from 04/07/2023 as well as earlier studies. FINDINGS: Brain: Postoperative changes from prior right parietal craniotomy again seen. 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 Size of the lesion is markedly decreased from prior, now measuring 2.9 x 3.5 x 2.3 cm (series 18, image 10). Superimposed susceptibility artifact consistent with blood products. Persistent but improved vasogenic edema within the adjacent posterior right cerebral hemisphere, with improved/resolved mass effect on the right lateral ventricle. Previously seen right to left shift has resolved. Findings consistent with interval response to therapy. Now seen is restricted diffusion involving the right insular cortex and overlying posterior right frontal region (series 5, images 26, 30). Associated gyral swelling with T2/FLAIR signal abnormality within this location (series 9, image 28). No associated enhancement. Findings are favored to reflect sequelae of  seizure. No other mass lesion or abnormal enhancement. No other acute or subacute infarct. No hydrocephalus or extra-axial fluid collection. Underlying cerebral white matter disease noted, stable. Vascular: Major intracranial vascular flow voids are maintained. Skull and upper cervical spine: Craniocervical junction within normal limits. Bone marrow signal intensity within normal limits. Post craniotomy changes at the right parietal calvarium without adverse features. Sinuses/Orbits: Globes and orbital soft tissues within normal limits. Mild mucosal thickening present about the ethmoidal  air cells and maxillary sinuses. No mastoid effusion. Other: None. IMPRESSION: 1. Marked interval decrease in size of right parietal mass/lymphoma, now measuring 2.9 x 3.5 x 2.3 cm (previously 5.4 x 3.6 x 4.4 cm), consistent with interval response to therapy. Persistent but improved vasogenic edema within the adjacent posterior right cerebral hemisphere, with interval resolution of previously seen right-to-left midline shift. 2. 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 Correlation with symptomatology and EEG recommended. Evolving changes of subacute ischemia would be the primary differential consideration, but are felt to be less likely. Electronically Signed   By: Rise Mu M.D.   On: 04/25/2023 05:24   DG CHEST PORT 1 VIEW Result Date: 04/10/2023 CLINICAL DATA:  Shortness of breath.  History of lymphoma EXAM: PORTABLE CHEST 1 VIEW COMPARISON:  X-ray 08/11/2022.  PET-CT 12/27/2022 FINDINGS: Right IJ chest port with tip overlying the right atrium. The port is accessed. Widening of the mediastinum is again seen. Enlarged cardiac silhouette. Basilar atelectasis or scar. No pneumothorax. Film is underinflated. No edema or effusion. Degenerative changes of the spine IMPRESSION: Underinflation.  Basilar atelectasis or scar. Widened mediastinum. Similar previous. Please correlate with prior PET-CT 12/27/2022 demonstrating prominent mediastinal fat. Chest port Electronically Signed   By: Karen Kays M.D.   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  EEG to evaluate for seizure Level of alertness: Awake, asleep AEDs during EEG study: None Technical aspects: This EEG study was done with scalp electrodes positioned according to the 10-20 International system of electrode placement. Electrical activity was reviewed with band pass filter of 1-70Hz , sensitivity of 7 uV/mm, display speed of 38mm/sec with a 60Hz  notched filter applied as appropriate. EEG data were recorded continuously and digitally stored.  Video monitoring was available and reviewed as appropriate. Description: The posterior dominant rhythm consists of 8-9 Hz activity of moderate voltage (25-35 uV) seen predominantly in posterior head regions, symmetric and reactive to eye opening and eye closing. Sleep was characterized by vertex waves, sleep spindles (12 to 14 Hz), maximal frontocentral region. EEG showed continuous sharply contoured 3 to 6 Hz theta-delta slowing in right parietal region consistent with breach artifact. Sharp waves were noted in right parietal region. Hyperventilation and photic stimulation were not performed.   ABNORMALITY - Sharp wave, right parietal region - Continuous slow,  right parietal region IMPRESSION: This study showed evidence of epileptogenicity arising from right parietal region. Additionally there is cortical dysfunction arising from right parietal region consistent with underlying craniotomy. No seizures were seen throughout the recording. Charlsie Quest   MR Brain W and Wo Contrast Result Date: 04/07/2023 CLINICAL DATA:  Provided history:  Neuro deficit, persistent/recurrent, CNS neoplasm suspected. Diffuse large B-cell lymphoma. Left-sided weakness with midline shift on CT. EXAM: MRI HEAD WITHOUT AND WITH CONTRAST TECHNIQUE: Multiplanar, multiecho pulse sequences of the brain and surrounding structures were obtained without and with intravenous contrast. CONTRAST:  8mL GADAVIST GADOBUTROL 1 MMOL/ML IV SOLN COMPARISON:  Head CT 04/07/2023.  Brain MRI 03/07/2023  neck FINDINGS: Intermittently motion degraded examination. Most notably, the axial T1 post-contrast sequence and coronal T2 sequence are moderately motion degraded. Within this limitation findings are as follows. Brain: Since the prior MRI 06/05/2023, there has been right parietal craniotomy for biopsy/debulking of an enhancing right parietal lobe tumor. Non-acute blood products at the operative site. The residual enhancing tumor measures 5.4 x 3.6 x 4.4 cm (previously 4.4 x 3.0 x 3.3 when remeasured on prior). Prominent surrounding edema slightly increased. 6 mm leftward midline shift has also increased (previously 4 mm). Persistent effacement of portions of the right lateral ventricle. Persistent asymmetric prominence of the right lateral ventricle temporal horn consistent with ventricular entrapment. Trace postoperative subdural collection along the anterior right cerebral hemisphere. Mild-to-moderate multifocal T2 FLAIR hyperintense signal abnormality elsewhere within the cerebral white matter, nonspecific but compatible with chronic small vessel ischemic disease. There is no acute infarct. Vascular: Maintained flow voids within the proximal large arterial vessels. Skull and upper cervical spine: Right parietal cranioplasty. No focal worrisome marrow lesion. Sinuses/orbits: No orbital mass or acute orbital finding.Mild mucosal thickening within the left maxillary sinus. IMPRESSION: 1. Motion degraded examination. 2. Since the prior brain MRI 03/07/2023, there has been right parietal craniotomy for biopsy/debulking of an enhancing right parietal lobe tumor. The residual tumor has increased in size, now measuring 5.4 x 3.6 x 4.4 cm (previously 4.4 x 3.0 x 3.3 cm). Prominent surrounding edema has slightly increased. 6 mm leftward midline shift has also increased. Unchanged asymmetric prominence of the right lateral ventricle temporal horn consistent with ventricular entrapment. Electronically Signed   By: Jackey Loge D.O.   On: 04/07/2023 14:01   CT Head Wo Contrast Result Date: 04/07/2023 CLINICAL DATA:  Facial paralysis/weakness (CN 7) EXAM: CT HEAD WITHOUT CONTRAST TECHNIQUE: Contiguous axial images were obtained from the base of the skull through the vertex without intravenous contrast. RADIATION DOSE REDUCTION: This exam was performed according to the departmental dose-optimization program which includes automated exposure control, adjustment of the mA and/or kV according to patient size and/or use of iterative reconstruction technique. COMPARISON:  Head CT 03/06/2023, brain MR 03/07/2023 FINDINGS: Brain: No hemorrhage. No hydrocephalus. No extra-axial fluid collection postsurgical changes from a right parietal craniotomy with interval increase in size of the residual/recurrent tumor at the craniotomy site, now measuring up to 4.7 x 2.7 cm, previously 3.2 x 2.5 cm, when measured in a similar orientation. There is increased leftward midline shift, now measuring up to 6 mm, previously 3 mm. Unchanged disproportionate enlargement of the right temporal horn, worrisome for ventricular entrapment. Vascular: No hyperdense vessel or unexpected calcification. Skull: Normal. Negative for fracture or focal lesion. Status post parietal craniotomy Sinuses/Orbits: No middle ear or mastoid effusion. Mucosal thickening in the bilateral maxillary sinuses, right-greater-than-left orbits are unremarkable. Other: None. IMPRESSION: 1. Postsurgical changes from a right parietal craniotomy with interval increase in size of the residual/recurrent tumor at the craniotomy site, now measuring up to 4.7 x 2.7 cm, previously 3.2 x 2.5 cm. There is increased leftward midline shift, now measuring up to 6 mm, previously 3 mm. Recommend further evaluation with brain MRI. 2. Unchanged asymmetric enlargement  of the right temporal horn, compatible with ventricular entrapment Electronically Signed   By: Lorenza Cambridge M.D.   On: 04/07/2023 10:57

## 2023-05-06 ENCOUNTER — Inpatient Hospital Stay (HOSPITAL_COMMUNITY)

## 2023-05-06 DIAGNOSIS — M7989 Other specified soft tissue disorders: Secondary | ICD-10-CM

## 2023-05-06 DIAGNOSIS — D72819 Decreased white blood cell count, unspecified: Secondary | ICD-10-CM

## 2023-05-06 DIAGNOSIS — G40109 Localization-related (focal) (partial) symptomatic epilepsy and epileptic syndromes with simple partial seizures, not intractable, without status epilepticus: Secondary | ICD-10-CM | POA: Diagnosis not present

## 2023-05-06 DIAGNOSIS — G936 Cerebral edema: Secondary | ICD-10-CM | POA: Diagnosis not present

## 2023-05-06 DIAGNOSIS — C8339 Primary central nervous system lymphoma: Secondary | ICD-10-CM | POA: Diagnosis not present

## 2023-05-06 LAB — GLUCOSE, CAPILLARY
Glucose-Capillary: 134 mg/dL — ABNORMAL HIGH (ref 70–99)
Glucose-Capillary: 112 mg/dL — ABNORMAL HIGH (ref 70–99)
Glucose-Capillary: 112 mg/dL — ABNORMAL HIGH (ref 70–99)
Glucose-Capillary: 112 mg/dL — ABNORMAL HIGH (ref 70–99)
Glucose-Capillary: 88 mg/dL (ref 70–99)
Glucose-Capillary: 92 mg/dL (ref 70–99)

## 2023-05-06 LAB — COMPREHENSIVE METABOLIC PANEL
ALT: 28 U/L (ref 0–44)
AST: 19 U/L (ref 15–41)
Albumin: 2.1 g/dL — ABNORMAL LOW (ref 3.5–5.0)
Alkaline Phosphatase: 46 U/L (ref 38–126)
Anion gap: 10 (ref 5–15)
BUN: 25 mg/dL — ABNORMAL HIGH (ref 8–23)
CO2: 23 mmol/L (ref 22–32)
Calcium: 8.1 mg/dL — ABNORMAL LOW (ref 8.9–10.3)
Chloride: 101 mmol/L (ref 98–111)
Creatinine, Ser: 0.76 mg/dL (ref 0.61–1.24)
GFR, Estimated: >60 mL/min (ref >60)
Glucose, Bld: 149 mg/dL — ABNORMAL HIGH (ref 70–99)
Potassium: 3.9 mmol/L (ref 3.5–5.1)
Sodium: 134 mmol/L — ABNORMAL LOW (ref 135–145)
Total Bilirubin: 0.5 mg/dL (ref 0.0–1.2)
Total Protein: 4.9 g/dL — ABNORMAL LOW (ref 6.5–8.1)

## 2023-05-06 LAB — CBC
HCT: 32.6 % — ABNORMAL LOW (ref 39.0–52.0)
Hemoglobin: 10.9 g/dL — ABNORMAL LOW (ref 13.0–17.0)
MCH: 30 pg (ref 26.0–34.0)
MCHC: 33.4 g/dL (ref 30.0–36.0)
MCV: 89.8 fL (ref 80.0–100.0)
Platelets: 162 10*3/uL (ref 150–400)
RBC: 3.63 MIL/uL — ABNORMAL LOW (ref 4.22–5.81)
RDW: 13.4 % (ref 11.5–15.5)
WBC: 2.6 10*3/uL — ABNORMAL LOW (ref 4.0–10.5)
nRBC: 0 % (ref 0.0–0.2)

## 2023-05-06 NOTE — Progress Notes (Signed)
Pt taken to CT on vent. Tolerated well

## 2023-05-06 NOTE — Progress Notes (Signed)
 LTM EEG discontinued - skin breakdown at F8  unhook. [facility] notified.

## 2023-05-06 NOTE — Progress Notes (Signed)
 SLP Cancellation Note  Patient Details Name: Tyler [name] MRN: [id number] DOB: October 28, 1950   Cancelled treatment:       Reason Eval/Treat Not Completed: Medical issues which prohibited therapy. Pt remains intubated at this time. SLP will sign off. Please re-consult SLP team post extubation for clinical swallow evaluation.    [name] 05/06/2023, 9:06 AM

## 2023-05-06 NOTE — Progress Notes (Signed)
 Patient transported to MRI and back with no complications,  -[id number] patient back on 530 18 +5 30% to rest overnight RT will continue to monitor

## 2023-05-06 NOTE — Procedures (Addendum)
 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 EEG to evaluate for seizure    Level of alertness: Awake, asleep   AEDs during EEG study: LEV, LCM, Perampanel, Phenobarb, GBP   Technical aspects: This EEG study was done with scalp electrodes positioned according to the 10-20 International system of electrode placement. Electrical activity was reviewed with band pass filter of 1-70Hz , sensitivity of 7 uV/mm, display speed of 59mm/sec with a 60Hz  notched filter applied as appropriate. EEG data were recorded continuously and digitally stored.  Video monitoring was available and reviewed as appropriate.   Description: No clear posterior dominant rhythm was seen.  Sleep was characterized by sleep spindles (12 to 40 Hz), maximal frontocentral region.  EEG showed continuous generalized and maximal right parietal 3 to 6 Hz theta-delta slowing. Lateralized periodic discharges were noted in right hemisphere, maximal right parietal region with fluctuating frequency of 0.25-1, at times with overlying rhythmicity. Hyperventilation and photic stimulation were not performed.      ABNORMALITY -Lateralized periodic discharges with overlying rhythmicity, right hemisphere, maximal right parietal  region ( LPD+R) -Continuous slow, generalized and maximal right parietal region   IMPRESSION: This study showed evidence of epileptogenicity arising from right hemisphere, maximal right parietal region which is on the ictal-interictal continuum. Additionally there is cortical dysfunction in right hemisphere, maximal right parietal region likely secondary to underlying structural abnormality. Lastly there is moderate diffuse encephalopathy.  No definite seizures were noted.   Kermit Arnette [name]

## 2023-05-06 NOTE — Progress Notes (Addendum)
 NAME:  Tyler [name], MRN:  [id number], DOB:  05-20-1950, LOS: 14 ADMISSION DATE:  04/22/2023, CONSULTATION/ SERVICE  DATE:  04/25/23 REFERRING MD:  Dr. Candise Che, CHIEF COMPLAINT: Neck swelling, drooling, difficulty swallowing, altered mental status with concern for partial seizures  History of Present Illness:  Tyler [name] is a 73 year old gentleman with significant history of CNS relapse of large B-cell lymphoma, partial seizure who was admitted to cardiology service on 2/25 for second cycle of high-dose methotrexate with leucovorin rescue.  Patient also has prior history of paroxysmal atrial fibrillation not on anticoagulation due to bleeding risk, prostate cancer status post prostatectomy in 2021, pulmonary embolism, hepatitis B exposure.  Patient had high-grade large B-cell lymphoma and completed 6 cycles of R-CHOP with systemic remission in the past.  As per the primary oncologist, patient was doing well and receiving his medication as planned.  He was started on bicarb drip to minimize the methotrexate related toxicity.  However, this evening, oncologist noted increased neck swelling as well as patient had difficulty swallowing.  He was also noted to have drooling of saliva and developed altered mental status with facial twitching concerning for seizure activity.  There was concern about patient's ability to protect airway and possible angioedema (neck swelling), ED was consulted and patient was intubated emergently.  At the time of my evaluation, patient was intubated and unresponsive.  No family member at the bedside.  Rest of the information is as per the chart review and I was unable to independently verify with the patient.   PAST    has a past medical history of Cancer (HCC), Follicular lymphoma (HCC) (2024), History of pulmonary embolism, Hypertension, Myelodysplastic syndrome (HCC), PAF (paroxysmal atrial fibrillation) (HCC), and Recurrent UTI.   has a past surgical history that includes  Robot assisted laparoscopic radical prostatectomy (N/A, 09/01/2019); Lymphadenectomy (Bilateral, 09/01/2019); Esophagogastroduodenoscopy (egd) with propofol (N/A, 08/21/2022); biopsy (08/21/2022); IR IMAGING GUIDED PORT INSERTION (08/26/2022); Craniotomy (Right, 03/10/2023); and Application of cranial navigation (Right, 03/10/2023).   Significant events   04/22/2023 - ADMIT  2/27  - MRI brain - Marked interval decrease in size of right parietal mass/lymphoma, now measuring 2.9 x 3.5 x 2.3 cm (previously 5.4 x 3.6 x 4.4 cm), consistent with interval response to therapy. Persistent but improved vasogenic edema within the adjacent posterior right cerebral hemisphere, with interval resolution of previously seen right-to-left midline shift. 2. 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 Correlation with symptomatology and EEG recommended. Evolving changes of subacute ischemia would be the primary differential consideration, but are felt to be less likely.  2/28 - Transferred to ICU and intubated. Witnessed by Dr Candise Che with have left neck swelling, gurgling, and twitching left side -> then partially respnsvive -> ativan/confused -> EDP intubated. Around 7.30-8pm  04/26/23: Coretrack placed early hours .Intubated early hours.  On the ventilator FiO2 50%.  Low-grade fever since 04/17/2023 but normal white count. Ceribell EEG oongoing. Prmary language for aptient is Swahili. SBP HIGH with HR 40s - on scheduled lopressor . Ceribel shows    ceribell EEG is is suggestive of severe diffuse encephalopathy. No seizures were seen throughout the recording.   3/2 Extubated. Remains on PANDA. Scheduled for barium today. Remains on cEEG per Neuro  3/3-3/7 Continues to have focal seizures refractory to increased AED use. Family meeting held with agreement to continue to escalate treatment and proceed with intubation if  needed.  3/8 Intubated  3/10 continues  to have breakthrough seizures,   SUBJECTIVE/OVERNIGHT/INTERVAL HX   More awake today. Remains on vent  Objective   Blood pressure 132/85, pulse 84, temperature (!) 100.6 F (38.1 C), temperature source Axillary, resp. rate (!) 24, height 5' 7.01" (1.702 m), weight 83.7 kg, SpO2 99%.    Vent Mode: AC;PRVC FiO2 (%):  [30 %] 30 % Set Rate:  [18 bmp] 18 bmp Vt Set:  [530 mL] 530 mL PEEP:  [5 cmH20] 5 cmH20 Pressure Support:  [5 cmH20] 5 cmH20 Plateau Pressure:  [15 cmH20-18 cmH20] 15 cmH20   Intake/Output Summary (Last 24 hours) at 05/06/2023 0807 Last data filed at 05/06/2023 0800 Gross per 24 hour  Intake 1693.47 ml  Output 1425 ml  Net 268.47 ml   Filed Weights   04/30/23 0500 05/05/23 0500 05/06/23 0500  Weight: 90.1 kg 84.5 kg 83.7 kg   Physical Exam: Gen:      No acute distress HEENT:  EOMI, sclera anicteric Neck:     No masses; no thyromegaly, ETT Lungs:    Clear to auscultation bilaterally; normal respiratory effort CV:         Regular rate and rhythm; no murmurs Abd:      + bowel sounds; soft, non-tender; no palpable masses, no distension Ext:    No edema; adequate peripheral perfusion Skin:      Warm and dry; no rash Neuro: Awake, nods yes, no to commands.   Lab/imaging reviewed Significant for sodium 134, BUN/creatinine 25/0.76 AST 19, ALT 28 WBC 2.6, hemoglobin 10.9, platelets 162 No new imaging  Assessment & Plan:   Partial sensory seizures, left side - ongoing seizures Acute encephalopathy  -on 04/25/2203 felt due to CNS lymphoma with edema -04/25/2023: Morning reports of relatively normal mental status but at night sudden worsening with obtundation and potential seizures and intubated. MRI Mass improvd but 2/27 - New diffusion signal abnormality involving the right insular cortex and overlying right frontal lobe, favored to reflect sequelae of seizure -3/8 Intubated for decreased mental status in setting of  seizures and increased AED use P:  AED management per Neuro.  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 S/p chemotherapy R-CHOP.  S/p craniotomy with gross total resection 03/10/23.  - CT of head 04/07/2023 showed interval increase in size of tumor at the craniotomy site. MRI of head 04/07/2023 increase in size of tumor with slightly increased surrounding edema. MRI brain 2/27  - improvmeent - Currently on Hi-dose methotrexate chemotherapy regimen which is more aggressive approach.  Received cycle 1 on 04/09/23 and admitted for cycle 2 of HIGH DOSE Methotrexate chemotherapy regimen,  ompleted 04/23/2023. P: Oncology following  - Now methotrexate level < 0.1. Off sodium bicarb, off leucovorin. No longer monitoring urine PH --PO dexamethasone for brain edema - IV Rituxan as outpatient between 2nd and 3rd cycle of methotrexate and subsequently between treatment cycles. Plan for first week of March 2025 if discharged - can be delayed if needed> May start methotrexate while inpatient New multivitamins, folic acid  Acute respiratory failure 04/25/2023 evening associated with neck swelling   - Initially had neck swelling at time but CT neck is normal and exam 3/1 - is fat tissue. Able to extubate on 3/2. P:   Extubated 3/2. Reintubated 3/8 for increased AED utilization Continue full vent support for now.  No plans on weaning Follow  intermittent chest x-ray  Hypertensive and Bradycardic on lopressor 04/26/23 Normal echo 04/26/23 P:  DC lopressor due to bradycardia 3/2 Started scheduled hydralazine 3/3 Telemetry monitoring  Hep B antibody positive, chronic P: On tenofovir for hep B reactivation suppression in context of Rituxan use. Plan is to continue for 6 months after his last chemotherapy. Follow ID  outpatient  Intermittent fever  Likely secondary to seizure. No source of infection. RVP neg 3/2- current P: No clear evidence of ongoing infection Observe off antibiotics  Check LE duplex for DVT and trach asp for cultures  Mild anemia of critical illness onset less than 13 g% 04/26/2023 P:  Trend Transfuse if needed for Hg goal >7  Mild thrombocytopenia - mild P: Lovenox 40 mg every 24 hours    At risk for hypo and hyperglycemia P:   SSI Tube feeds   Best practice:  Diet: Tube feeds Pain/Anxiety/Delirium protocol (if indicated): Yes VAP protocol (if indicated): Yes DVT prophylaxis: lovnox GI prophylaxis: Home PPI Glucose control: ssi Mobility: bed rest Code Status: full Family Communication:   ATTESTATION & SIGNATURE   The patient is critically ill with multiple organ system failure and requires high complexity decision making for assessment and support, frequent evaluation and titration of therapies, advanced monitoring, review of radiographic studies and interpretation of complex data.   Critical Care Time devoted to patient care services, exclusive of separately billable procedures, described in this note is 35 minutes.   Chilton Greathouse MD [facility] See Amion for pager  If no response to pager , please call [phone] until 7pm After 7:00 pm call Elink  [phone] 05/06/2023, 8:07 AM

## 2023-05-06 NOTE — Progress Notes (Signed)
 OT Cancellation Note  Patient Details Name: Vidal Lampkins MRN: [id number] DOB: [birth year]   Cancelled Treatment:    Reason Eval/Treat Not Completed: Patient not medically ready. Pt is intubated and on continuous EEG.  Tyler Deis, OTR/L [facility] Acute Rehabilitation Office: [phone]   Myrla Halsted 05/06/2023, 3:25 PM

## 2023-05-06 NOTE — TOC Progression Note (Signed)
 Transition of Care [facility]) - Progression Note    Patient Details  Name: Tyler [name] MRN: [id number] Date of Birth: 1950-10-17  [facility] of [facility]) CM/SW Contact  Mearl Latin, LCSW Phone Number: 05/06/2023, 5:16 PM  Clinical Narrative:    TOC continuing to follow.    Expected Discharge Plan: IP Rehab Facility Barriers to Discharge: Continued Medical Work up, Conservator, museum/gallery and Services In-house Referral: Clinical Social Work   Post Acute Care Choice: IP Rehab Living arrangements for the past 2 months: Single Family Home                                       Social Determinants of Health (SDOH) Interventions SDOH Screenings   Food Insecurity: No Food Insecurity (04/22/2023)  Housing: Low Risk  (04/22/2023)  Transportation Needs: No Transportation Needs (04/22/2023)  Utilities: Not At Risk (04/22/2023)  Financial Resource Strain: Not on File (06/14/2021)   Received from [place], [place]  Physical Activity: Not on File (06/14/2021)   Received from [place], [place]  Social Connections: Patient Declined (04/22/2023)  Recent Concern: Social Connections - Moderately Isolated (04/07/2023)  Stress: Not on File (06/14/2021)   Received from [place], [place]  Tobacco Use: Low Risk  (04/22/2023)    Readmission Risk Interventions    04/23/2023   12:41 PM 03/07/2023    2:25 PM 08/22/2022    3:06 PM  Readmission Risk Prevention Plan  Transportation Screening Complete Complete Complete  Home Care Screening  Complete   Medication Review (RN CM)  Referral to Pharmacy   Medication Review (RN Care Manager) Complete  Complete  PCP or Specialist appointment within 3-5 days of discharge Complete  Complete  HRI or Home Care Consult Complete  Complete  SW Recovery Care/Counseling Consult Complete  Complete  Palliative Care Screening Not Applicable  Complete  Skilled Nursing Facility Not Applicable  Complete

## 2023-05-06 NOTE — Progress Notes (Signed)
 eLink Physician-Brief Progress Note Patient Name: Winferd Wease DOB: 1950-06-28 MRN: [id number]   Date of Service  05/06/2023  HPI/Events of Note  Minimal urine output, bladder scan with greater than 500 cc.  eICU Interventions  Standard urinary retention protocol, In-N-Out cath as needed        Savon Bordonaro 05/06/2023, 12:32 AM

## 2023-05-06 NOTE — Progress Notes (Addendum)
 Subjective: No acute events overnight.  Continues to be febrile with Tmax 101.7 F  ROS: Unable to obtain due to intubation  Examination  Vital signs in last 24 hours: Temp:  [98.9 F (37.2 C)-101.7 F (38.7 C)] 100.4 F (38 C) (03/11 0800) Pulse Rate:  [70-93] 76 (03/11 1000) Resp:  [17-33] 23 (03/11 1000) BP: (83-145)/(61-103) 94/61 (03/11 1000) SpO2:  [95 %-100 %] 100 % (03/11 1000) FiO2 (%):  [30 %] 30 % (03/11 0842) Weight:  [83.7 kg] 83.7 kg (03/11 0500)  General: lying in bed, intubated Neuro: Opens eyes to noxious stimulation, did not look at examiner, did not follow commands, did not answer orientation questions, has right gaze preference but able to cross midline to the left, subtle withdrawal to noxious stimuli left upper and left lower extremity, no movement in right upper and right lower extremity   Basic Metabolic Panel: Recent Labs  Lab 04/29/23 1652 04/30/23 0449 04/30/23 0449 05/01/23 0559 05/02/23 0558 05/03/23 0500 05/03/23 1118 05/04/23 1133 05/05/23 0500 05/06/23 0549  NA  --  137   < > 136 135 133* 134* 135 135 134*  K  --  4.4   < > 4.0 3.9 4.0 4.0 3.9 3.9 3.9  CL  --  100   < > 104 105 99  --  101 102 101  CO2  --  27   < > 26 26 25   --  26 24 23   GLUCOSE  --  150*   < > 128* 131* 127*  --  125* 118* 149*  BUN  --  25*   < > 30* 29* 27*  --  27* 26* 25*  CREATININE  --  0.86   < > 0.98 0.89 0.86  --  0.79 0.70 0.76  CALCIUM  --  8.4*   < > 7.7* 7.5* 8.0*  --  8.1* 7.9* 8.1*  MG 2.1 1.9  --  1.9 1.9 1.9  --   --   --   --   PHOS 2.4*  --   --   --   --   --   --   --   --   --    < > = values in this interval not displayed.    CBC: Recent Labs  Lab 05/02/23 0558 05/03/23 0500 05/03/23 1118 05/04/23 1133 05/05/23 0500 05/06/23 0549  WBC 3.4* 2.5*  --  3.3* 3.1* 2.6*  HGB 11.5* 11.9* 10.2* 12.3* 11.5* 10.9*  HCT 34.1* 36.5* 30.0* 37.5* 35.0* 32.6*  MCV 91.9 90.3  --  90.6 91.6 89.8  PLT 131* 131*  --  151 153 162     Coagulation  Studies: No results for input(s): "LABPROT", "INR" in the last 72 hours.  Imaging No new brain imaging overnight  ASSESSMENT AND PLAN: 73 year old male with history of A-fib not on anticoagulation due to bleeding risk, prostate cancer status post prostatectomy, PE, hep B exposure, CNS recent labs of large B-cell lymphoma, focal seizures on Keppra who was admitted for inpatient chemo and was noted to have left facial twitching consistent with focal seizures.   Epilepsy with breakthrough seizure Cerebral edema B-cell lymphoma Leukopenia Fever -In the setting of underlying lymphoma and edema -Leukopenia could be secondary to antiseizure medications versus chemotherapy   Recommendation -Continue gabapentin 100 mg 3 times daily, phenobarbital 97mg  daily, Vimpat to 200 mg twice daily and Keppra 2000 mg twice daily -Holding perampanel due to worsening white cell count which was noticed after  starting perampanel -Phenobarb level is subtherapeutic.  As seizures appear to have improved, will hold off on increasing dose.  However, if seizures recur, depending on mentation this can be increased -Continue video EEG to monitor for intermittent seizures -If any further seizures, can add topamax or increase gabapentin -His right side appears weaker than left which is not consistent with previous examination.  Therefore we will repeat CT head to look for any acute abnormality -Patient seizure frequency has improved however continues to be febrile.  Will defer to medicine to look for other etiologies. -Continue seizure precautions -As needed IV Ativan for clinical seizures lasting more than 5 minutes -Discussed plan with Dr. Candise Che and Dr. Isaiah Serge   I have spent a total of  36 minutes with the patient reviewing hospital notes,  test results, labs and examining the patient as well as establishing an assessment and plan.  > 50% of time was spent in direct patient care.    Lindie Spruce [facility] For questions after 5pm please refer to AMION to reach the Neurologist on call

## 2023-05-06 NOTE — Progress Notes (Signed)
 LTM maint complete - no skin breakdown under: F7,C3

## 2023-05-06 NOTE — Progress Notes (Signed)
 PT Cancellation Note  Patient Details Name: Tyler [name] MRN: [id number] DOB: 08-16-1950   Cancelled Treatment:    Reason Eval/Treat Not Completed: Medical issues which prohibited therapy (pt still intubated. As this is the 3rd consecutive medical cancel, will sign off at this time. Please re-consult when status changes).  Lillia Pauls, PT, DPT Acute Rehabilitation Services Office [phone]    Norval Morton 05/06/2023, 3:07 PM

## 2023-05-07 ENCOUNTER — Inpatient Hospital Stay (HOSPITAL_COMMUNITY)

## 2023-05-07 ENCOUNTER — Telehealth (HOSPITAL_COMMUNITY): Payer: Self-pay | Admitting: Pharmacy Technician

## 2023-05-07 ENCOUNTER — Other Ambulatory Visit (HOSPITAL_COMMUNITY): Payer: Self-pay

## 2023-05-07 DIAGNOSIS — J9601 Acute respiratory failure with hypoxia: Secondary | ICD-10-CM | POA: Diagnosis not present

## 2023-05-07 DIAGNOSIS — G40109 Localization-related (focal) (partial) symptomatic epilepsy and epileptic syndromes with simple partial seizures, not intractable, without status epilepticus: Secondary | ICD-10-CM | POA: Diagnosis not present

## 2023-05-07 DIAGNOSIS — C8339 Primary central nervous system lymphoma: Secondary | ICD-10-CM | POA: Diagnosis not present

## 2023-05-07 DIAGNOSIS — R509 Fever, unspecified: Secondary | ICD-10-CM | POA: Diagnosis not present

## 2023-05-07 LAB — COMPREHENSIVE METABOLIC PANEL
ALT: 30 U/L (ref 0–44)
AST: 19 U/L (ref 15–41)
Albumin: 2.3 g/dL — ABNORMAL LOW (ref 3.5–5.0)
Alkaline Phosphatase: 51 U/L (ref 38–126)
Anion gap: 10 (ref 5–15)
BUN: 24 mg/dL — ABNORMAL HIGH (ref 8–23)
CO2: 25 mmol/L (ref 22–32)
Calcium: 8.4 mg/dL — ABNORMAL LOW (ref 8.9–10.3)
Chloride: 98 mmol/L (ref 98–111)
Creatinine, Ser: 0.83 mg/dL (ref 0.61–1.24)
GFR, Estimated: >60 mL/min (ref >60)
Glucose, Bld: 119 mg/dL — ABNORMAL HIGH (ref 70–99)
Potassium: 4.4 mmol/L (ref 3.5–5.1)
Sodium: 133 mmol/L — ABNORMAL LOW (ref 135–145)
Total Bilirubin: 0.4 mg/dL (ref 0.0–1.2)
Total Protein: 5.6 g/dL — ABNORMAL LOW (ref 6.5–8.1)

## 2023-05-07 LAB — GLUCOSE, CAPILLARY
Glucose-Capillary: 164 mg/dL — ABNORMAL HIGH (ref 70–99)
Glucose-Capillary: 92 mg/dL (ref 70–99)
Glucose-Capillary: 110 mg/dL — ABNORMAL HIGH (ref 70–99)
Glucose-Capillary: 150 mg/dL — ABNORMAL HIGH (ref 70–99)
Glucose-Capillary: 139 mg/dL — ABNORMAL HIGH (ref 70–99)
Glucose-Capillary: 113 mg/dL — ABNORMAL HIGH (ref 70–99)

## 2023-05-07 LAB — CBC
HCT: 35.6 % — ABNORMAL LOW (ref 39.0–52.0)
Hemoglobin: 12 g/dL — ABNORMAL LOW (ref 13.0–17.0)
MCH: 30.5 pg (ref 26.0–34.0)
MCHC: 33.7 g/dL (ref 30.0–36.0)
MCV: 90.4 fL (ref 80.0–100.0)
Platelets: 194 10*3/uL (ref 150–400)
RBC: 3.94 MIL/uL — ABNORMAL LOW (ref 4.22–5.81)
RDW: 13.2 % (ref 11.5–15.5)
WBC: 3.7 10*3/uL — ABNORMAL LOW (ref 4.0–10.5)
nRBC: 0 % (ref 0.0–0.2)

## 2023-05-07 MED ORDER — VANCOMYCIN HCL 1750 MG/350ML IV SOLN
1750.0000 mg | Freq: Once | INTRAVENOUS | Status: AC
  Administered 2023-05-07: 1750 mg via INTRAVENOUS
  Filled 2023-05-07: qty 350

## 2023-05-07 MED ORDER — SODIUM CHLORIDE 0.9 % IV SOLN
2.0000 g | Freq: Three times a day (TID) | INTRAVENOUS | Status: DC
  Administered 2023-05-07 – 2023-05-08 (×4): 2 g via INTRAVENOUS
  Filled 2023-05-07 (×4): qty 12.5

## 2023-05-07 MED ORDER — TENOFOVIR DISOPROXIL FUMARATE 300 MG PO TABS
300.0000 mg | ORAL_TABLET | Freq: Every day | ORAL | Status: DC
  Administered 2023-05-08: 300 mg
  Filled 2023-05-07: qty 1

## 2023-05-07 MED ORDER — VANCOMYCIN HCL IN DEXTROSE 1-5 GM/200ML-% IV SOLN
1000.0000 mg | Freq: Two times a day (BID) | INTRAVENOUS | Status: DC
  Administered 2023-05-07 – 2023-05-08 (×2): 1000 mg via INTRAVENOUS
  Filled 2023-05-07 (×2): qty 200

## 2023-05-07 NOTE — Progress Notes (Signed)
 Subjective: Continues to be barely responsive.   ROS: Unable to obtain due to poor mental status  Examination  Vital signs in last 24 hours: Temp:  [98 F (36.7 C)-102.2 F (39 C)] 102.2 F (39 C) (03/12 0800) Pulse Rate:  [68-89] 89 (03/12 0800) Resp:  [16-28] 17 (03/12 0800) BP: (87-154)/(62-107) 150/98 (03/12 0800) SpO2:  [94 %-100 %] 98 % (03/12 0800) FiO2 (%):  [30 %] 30 % (03/12 0800) Weight:  [83.9 kg] 83.9 kg (03/12 0500)  General: lying in bed, intubated Neuro: Opens eyes to noxious stimulation, did not look at examiner, did not follow commands, did not answer orientation questions, has right gaze preference but able to cross midline to the left, subtle withdrawal to noxious stimuli left upper and left lower extremity, no movement in right upper and right lower extremity  Basic Metabolic Panel: Recent Labs  Lab 05/01/23 0559 05/02/23 0558 05/03/23 0500 05/03/23 1118 05/04/23 1133 05/05/23 0500 05/06/23 0549 05/07/23 0541  NA 136 135 133* 134* 135 135 134* 133*  K 4.0 3.9 4.0 4.0 3.9 3.9 3.9 4.4  CL 104 105 99  --  101 102 101 98  CO2 26 26 25   --  26 24 23 25   GLUCOSE 128* 131* 127*  --  125* 118* 149* 119*  BUN 30* 29* 27*  --  27* 26* 25* 24*  CREATININE 0.98 0.89 0.86  --  0.79 0.70 0.76 0.83  CALCIUM 7.7* 7.5* 8.0*  --  8.1* 7.9* 8.1* 8.4*  MG 1.9 1.9 1.9  --   --   --   --   --     CBC: Recent Labs  Lab 05/03/23 0500 05/03/23 1118 05/04/23 1133 05/05/23 0500 05/06/23 0549 05/07/23 0541  WBC 2.5*  --  3.3* 3.1* 2.6* 3.7*  HGB 11.9* 10.2* 12.3* 11.5* 10.9* 12.0*  HCT 36.5* 30.0* 37.5* 35.0* 32.6* 35.6*  MCV 90.3  --  90.6 91.6 89.8 90.4  PLT 131*  --  151 153 162 194     Coagulation Studies: No results for input(s): "LABPROT", "INR" in the last 72 hours.  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 No hemorrhagic transformation. No significant intracranial mass effect. Otherwise stable post treatment appearance of the brain, right parietal tumor resection.  MR Brain wo contrast 05/06/2023: Resolving diffusion abnormality within the posterior right MCA territory. Increased extent of hyperintense T2-weighted signal in the right hemisphere. This could indicate progression of lymphoma, or confluence of post-ischemic/post-ictal edema cytotoxic edema and lymphoma-related vasogenic edema. Compared to 04/07/2023, however,the area of abnormal signal has decreased. Unchanged appearance of intraparenchymal hemorrhage in the posterior right parietal lobe.   MR brain w and wo contrast 04/24/2023:  Marked interval decrease in size of right parietal mass/lymphoma,now measuring 2.9 x 3.5 x 2.3 cm (previously 5.4 x 3.6 x 4.4 cm), consistent with interval response to therapy. Persistent but improved vasogenic edema within the adjacent posterior right cerebral hemisphere, with interval resolution of previously seen right-to-left midline shift. 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 Correlation with symptomatology and EEG recommended. Evolving changes of subacute ischemia would be the primary differential consideration, but are felt to be less likely.   05/06/2023   04/25/2023    ASSESSMENT AND PLAN: 73 year old male with  history of A-fib not on anticoagulation due to bleeding risk, prostate cancer status post prostatectomy, PE, hep B exposure, CNS recent labs of large B-cell lymphoma, focal seizures on Keppra who was admitted for inpatient chemo and was noted to have left facial twitching consistent with focal seizures.   Epilepsy with breakthrough seizure Cerebral edema B-cell lymphoma Leukopenia Fever -In the setting of underlying lymphoma and edema -Leukopenia  could be secondary to antiseizure medications versus chemotherapy - Fever with GPC in tracheal aspirate - Restricted diffusion likely due to seizure  Recommendation -Continue gabapentin 100 mg 3 times daily, Vimpat to 200 mg twice daily and Keppra 2000 mg twice daily -Stopped perampanel due to worsening white cell count which was noticed after starting perampanel - Stopped phenobarb to minimize sedation -Continue video EEG to monitor for intermittent seizures -If any further seizures, can add topamax or increase gabapentin - MRI shows restricted diffusion which appears to be resolving ( likely due to frequent seizures) but with worsening flair changes which could be due to edema vs progression of disease. Already on treatment for seizures and dexamethasone. Oncology planning for another cycle of methotrexate vs radiation - Trach aspirate growing GPC. If fever doesn't improve, can consider LP -Continue seizure precautions -As needed IV Ativan for clinical seizures lasting more than 5 minutes - Discussed plan with Dr Isaiah Serge   I have spent a total of  37 minutes with the patient reviewing hospital notes,  test results, labs and examining the patient as well as establishing an assessment and plan.  > 50% of time was spent in direct patient care.    Lindie Spruce [facility] For questions after 5pm please refer to AMION to reach the Neurologist on call

## 2023-05-07 NOTE — Progress Notes (Signed)
 Pharmacy Antibiotic Note  Tyler [name] is a 73 y.o. male admitted on 04/22/2023 with CNS relapse of large B-cell lymphoma, partial seizure now with suspicion for pneumonia and sepsis.  Pharmacy has been consulted for cefepime and vancomycin dosing.  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 and on prophylaxis for 6 months Febrile Tm 102.2, WBC 3.7 3/11 TA: GPCs/GPRs BP 99/72, HR 77, MV stable (FiO2 30%, PEEP 5)  Plan: Cefepime 2g IV every 8 hours Vancomycin 1750mg  IV x1 loading dose Vancomycin 1000mg  IV every 12 hours (eAUC 482, Scr 0.83, Vd 0.72) - Goal AUC 400-600 - Follow up cultures, CBC, renal function, fever curve, and signs of clinical improvement - Deescalate as able  Height: 5' 7.01" (170.2 cm) Weight: 83.9 kg (184 lb 15.5 oz) IBW/kg (Calculated) : 66.12  Temp (24hrs), Avg:100.2 F (37.9 C), Min:98 F (36.7 C), Max:102.2 F (39 C)  Recent Labs  Lab 05/03/23 0500 05/04/23 1133 05/05/23 0500 05/06/23 0549 05/07/23 0541  WBC 2.5* 3.3* 3.1* 2.6* 3.7*  CREATININE 0.86 0.79 0.70 0.76 0.83    Estimated Creatinine Clearance: 85.7 mL/min (by C-G formula based on SCr of 0.83 mg/dL).    Allergies  Allergen Reactions   Amitriptyline Other (See Comments)    Nightmares, Night sweats, Migraines    Amlodipine Swelling    BILATERAL FEET TO ANKLES   Neurontin [Gabapentin] Other (See Comments)    Nightmares; Night sweats; Headache    Antimicrobials this admission: 3/12 vancomycin >>  3/12 cefepime >>   Vemlidy x 6 months  Microbiology results: 3/11 TA: GPCs/GPRs 3/3 RVP neg 3/1 TA: normal flora 3/1 MRSA neg 3/1 Bcx: neg 2/10 RVP neg  Thank you for allowing pharmacy to be a part of this patient's care.  Stephenie Acres, PharmD PGY1 Pharmacy Resident 05/07/2023 12:06 PM

## 2023-05-07 NOTE — Progress Notes (Signed)
 LTM EEG hooked up and running -  skin breakdown midline head and F8 area. - push button tested - [facility] monitoring.

## 2023-05-07 NOTE — Progress Notes (Signed)
 NAME:  Tyler [name], MRN:  [id number], DOB:  Sep 24, 1950, LOS: 15 ADMISSION DATE:  04/22/2023, CONSULTATION/ SERVICE  DATE:  04/25/23 REFERRING MD:  Dr. Candise Che, CHIEF COMPLAINT: Neck swelling, drooling, difficulty swallowing, altered mental status with concern for partial seizures  History of Present Illness:  Tyler [name] is a 73 year old gentleman with significant history of CNS relapse of large B-cell lymphoma, partial seizure who was admitted to cardiology service on 2/25 for second cycle of high-dose methotrexate with leucovorin rescue.  Patient also has prior history of paroxysmal atrial fibrillation not on anticoagulation due to bleeding risk, prostate cancer status post prostatectomy in 2021, pulmonary embolism, hepatitis B exposure.  Patient had high-grade large B-cell lymphoma and completed 6 cycles of R-CHOP with systemic remission in the past.  As per the primary oncologist, patient was doing well and receiving his medication as planned.  He was started on bicarb drip to minimize the methotrexate related toxicity.  However, this evening, oncologist noted increased neck swelling as well as patient had difficulty swallowing.  He was also noted to have drooling of saliva and developed altered mental status with facial twitching concerning for seizure activity.  There was concern about patient's ability to protect airway and possible angioedema (neck swelling), ED was consulted and patient was intubated emergently.  At the time of my evaluation, patient was intubated and unresponsive.  No family member at the bedside.  Rest of the information is as per the chart review and I was unable to independently verify with the patient.   PAST    has a past medical history of Cancer (HCC), Follicular lymphoma (HCC) (2024), History of pulmonary embolism, Hypertension, Myelodysplastic syndrome (HCC), PAF (paroxysmal atrial fibrillation) (HCC), and Recurrent UTI.   has a past surgical history that includes  Robot assisted laparoscopic radical prostatectomy (N/A, 09/01/2019); Lymphadenectomy (Bilateral, 09/01/2019); Esophagogastroduodenoscopy (egd) with propofol (N/A, 08/21/2022); biopsy (08/21/2022); IR IMAGING GUIDED PORT INSERTION (08/26/2022); Craniotomy (Right, 03/10/2023); and Application of cranial navigation (Right, 03/10/2023).   Significant events   04/22/2023 - ADMIT  2/27  - MRI brain - Marked interval decrease in size of right parietal mass/lymphoma, now measuring 2.9 x 3.5 x 2.3 cm (previously 5.4 x 3.6 x 4.4 cm), consistent with interval response to therapy. Persistent but improved vasogenic edema within the adjacent posterior right cerebral hemisphere, with interval resolution of previously seen right-to-left midline shift. 2. 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 Correlation with symptomatology and EEG recommended. Evolving changes of subacute ischemia would be the primary differential consideration, but are felt to be less likely.  2/28 - Transferred to ICU and intubated. Witnessed by Dr Candise Che with have left neck swelling, gurgling, and twitching left side -> then partially respnsvive -> ativan/confused -> EDP intubated. Around 7.30-8pm  04/26/23: Coretrack placed early hours .Intubated early hours.  On the ventilator FiO2 50%.  Low-grade fever since 04/17/2023 but normal white count. Ceribell EEG oongoing. Prmary language for aptient is Swahili. SBP HIGH with HR 40s - on scheduled lopressor . Ceribel shows    ceribell EEG is is suggestive of severe diffuse encephalopathy. No seizures were seen throughout the recording.   3/2 Extubated. Remains on PANDA. Scheduled for barium today. Remains on cEEG per Neuro  3/3-3/7 Continues to have focal seizures refractory to increased AED use. Family meeting held with agreement to continue to escalate treatment and proceed with intubation if  needed.  3/8 Intubated  3/10 continues  to have breakthrough seizures,   SUBJECTIVE/OVERNIGHT/INTERVAL HX   CT and MRI overnight for right extremity weakness Continues to spike fevers  Objective   Blood pressure (!) 150/98, pulse 89, temperature (!) 102.2 F (39 C), temperature source Axillary, resp. rate 17, height 5' 7.01" (1.702 m), weight 83.9 kg, SpO2 98%.    Vent Mode: PRVC FiO2 (%):  [30 %] 30 % Set Rate:  [18 bmp] 18 bmp Vt Set:  [530 mL] 530 mL PEEP:  [5 cmH20] 5 cmH20 Pressure Support:  [5 cmH20] 5 cmH20 Plateau Pressure:  [18 cmH20-20 cmH20] 18 cmH20   Intake/Output Summary (Last 24 hours) at 05/07/2023 2956 Last data filed at 05/07/2023 0800 Gross per 24 hour  Intake 1678.02 ml  Output 1925 ml  Net -246.98 ml   Filed Weights   05/05/23 0500 05/06/23 0500 05/07/23 0500  Weight: 84.5 kg 83.7 kg 83.9 kg   Physical Exam: Gen:      No acute distress HEENT:  EOMI, sclera anicteric Neck:     No masses; no thyromegaly, ETT Lungs:    Clear to auscultation bilaterally; normal respiratory effort CV:         Regular rate and rhythm; no murmurs Abd:      + bowel sounds; soft, non-tender; no palpable masses, no distension Ext:    No edema; adequate peripheral perfusion Skin:      Warm and dry; no rash Neuro: Sedated, arousable  Lab/imaging reviewed Significant for sodium 133, BUN/creatinine 24/0.83 WBC 3.7, hemoglobin 12.0, platelets 194  CT head with progressive right hemisphere cerebral edema MRI with resolving diffusion abnormality in right MCA territory, possible progression of lymphoma versus cytotoxic changes.  Unchanged intraparenchymal hemorrhage in the posterior right parietal lobe  Assessment & Plan:   Partial sensory seizures, left side - ongoing seizures Acute encephalopathy  -on 04/25/2203 felt due to CNS lymphoma with edema -04/25/2023: Morning reports of relatively normal mental status but at night sudden worsening with obtundation and potential  seizures and intubated. MRI Mass improvd but 2/27 - New diffusion signal abnormality involving the right insular cortex and overlying right frontal lobe, favored to reflect sequelae of seizure -3/8 Intubated for decreased mental status in setting of seizures and increased AED use P:  AED management per Neuro.  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 S/p chemotherapy R-CHOP.  S/p craniotomy with gross total resection 03/10/23.  - CT of head 04/07/2023 showed interval increase in size of tumor at the craniotomy site. MRI of head 04/07/2023 increase in size of tumor with slightly increased surrounding edema. MRI brain 2/27  - improvmeent - Currently on Hi-dose methotrexate chemotherapy regimen which is more aggressive approach.  Received cycle 1 on 04/09/23 and admitted for cycle 2 of HIGH DOSE Methotrexate chemotherapy regimen,  ompleted 04/23/2023. P: Oncology following  --PO dexamethasone for brain edema - IV Rituxan as outpatient between 2nd and 3rd cycle of methotrexate and subsequently between treatment cycles. Plan for first week of March 2025 if discharged - can be delayed if needed> May start methotrexate while inpatient Continue multivitamins, folic acid  Acute respiratory failure 04/25/2023 evening associated with neck swelling   - Initially had neck swelling at time but CT neck is normal and exam 3/1 - is fat tissue. Able to extubate on 3/2. P:   Extubated 3/2. Reintubated 3/8 for increased AED  utilization Continue full vent support for now. Wean as tolerated With antibiotics for fevers and new HAP  Hypertensive and Bradycardic on lopressor 04/26/23 Normal echo 04/26/23 P:  DC lopressor due to bradycardia 3/2 Started scheduled hydralazine 3/3 Telemetry monitoring  Hep B antibody positive,  chronic P: On tenofovir for hep B reactivation suppression in context of Rituxan use. Plan is to continue for 6 months after his last chemotherapy. Follow ID outpatient  Fevers P: Tracheal aspirate reviewed with GPC and GNR Start Vanco, cefepime for treatment of HAP No DVT in LE  Mild anemia of critical illness onset less than 13 g% 04/26/2023 P:  Trend Transfuse if needed for Hg goal >7  Mild thrombocytopenia - mild P: Lovenox 40 mg every 24 hours    At risk for hypo and hyperglycemia P:   SSI Tube feeds   Best practice:  Diet: Tube feeds Pain/Anxiety/Delirium protocol (if indicated): Yes VAP protocol (if indicated): Yes DVT prophylaxis: lovnox GI prophylaxis: Home PPI Glucose control: ssi Mobility: bed rest Code Status: full Family Communication:   ATTESTATION & SIGNATURE   The patient is critically ill with multiple organ system failure and requires high complexity decision making for assessment and support, frequent evaluation and titration of therapies, advanced monitoring, review of radiographic studies and interpretation of complex data.   Critical Care Time devoted to patient care services, exclusive of separately billable procedures, described in this note is 35 minutes.   Chilton Greathouse MD [facility] See Amion for pager  If no response to pager , please call [phone] until 7pm After 7:00 pm call Elink  [phone] 05/07/2023, 9:03 AM

## 2023-05-07 NOTE — Progress Notes (Addendum)
 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  Had shown significant improvements.     - On 04/25/2023 pt seen with likely partial seizure.  Rapid response was called and appropriate medications administered and patient intubated. - Transferred from [facility] to [facility].  On video EEG monitoring for seizures.   - On antiseizure meds and steroids.    Continue seizure precautions. - MRI brain 04/24/2023 in the AM showed marked interval decrease in right parietal mass persistent with response to therapy.  However new diffusion signal abnormality involving right insular cortex and overlying right frontal favored to reflect sequelae of seizure. - MRI brain done 05/06/2023 shows resolving diffusion abnormality within posterior right MCA territory.  Compared to scan of 04/07/2023 area of abnormal signal has not decreased.  - If seizure control improves with improvement of his tumors, can restart additional 2 HD Methotrexate treatments.   - If his functional status does not allow additional HD MTX, we would consult rad onc to control the tumor with RT.   - Family meeting held on 05/01/2023.  Family opted to continue with seizure control as outlined by Neuro Dr. Melynda Ripple and continue with next cycle of chemotherapy as available. - Intubated due to respiratory failure status post 3-minute seizure on 05/03/2023.  Per EEG average duration of seizures showing 2 to 6 minutes. - Remains intubated and sedated with cont eeg monitoring.   - Tube feeding via ng tube intact.   - Critical care and neuro teams following. - Medical Oncology/Dr. Candise Che following closely.  Possible stroke - It is noted that neurology suggest he may have had a stroke in addition to seizures. - CT angio head and neck done today, results pending.    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   S/p chemotherapy R-[facility]OP.  S/p craniotomy with resection 03/10/23.  - CT head 04/07/2023 showed interval increase in size of tumor at the craniotomy site.  MRI head 04/07/2023 increase in size of tumor with slightly increased surrounding edema. - S/P C2 Hi-dose methotrexate chemo regimen. Completed 04/23/2023. - S/P daily monitoring of Urine pH and methotrexate levels. S/P IV leucovorin rescue. - Cycle 3 was due 05/07/2023.  Chemo will be held this week and will plan for week of March 17.   - Hold: Outpatient IV Rituxan between 2nd and 3rd cycle of methotrexate and subsequently between treatment cycles until acute phase resolves. - Consideration for brain radiation for local control.  - Continue daily labs - Medical oncology/Dr. Candise Che following closely.     Pancytopenia: Thrombocytopenia, Anemia, Leukopenia - Mild - Likely multifactorial  - platelets 214 K today - Hemoglobin 11.4, stable - WBC 3.9, stable - No intervention required at this time - Monitor CBC with differential    Elevated blood glucose  - Monitor blood sugar levels closely.   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  Per pharmacy this may react  with seizure meds patient is currently taking, okay to change to Entecavir, he is checking on cost once patient is discharged. - Plan is to continue for 6 months after his last chemotherapy. - Follow ID outpatient   VTE prophylaxis - With Lovenox   Fever - Temperature 100.2-102.2 today - May be related to seizure, meds, infection. - Management per critical care team - Monitor fever  curve        Code Status Full  Subjective:  Patient seen laying in bed, remains intubated with continuous EEG monitoring ongoing.  Multiple IV lines intact.  Feeding tube intact.  No acute distress is noted.     Objective:  Vitals:   05/07/23 1124 05/07/23 1200  BP: 113/77 99/72  Pulse: 77 76  Resp: 19 18  Temp:  100.2 F (37.9 C)  SpO2: 98% 97%     Intake/Output Summary (Last 24 hours) at 05/07/2023 1338 Last data filed at 05/07/2023 1200 Gross per 24 hour  Intake 1946.32 ml  Output 1925 ml  Net 21.32 ml     REVIEW OF SYSTEMS: Unable to obtain Constitutional: + Intubated + sedated  PHYSICAL EXAMINATION: ECOG PERFORMANCE STATUS: 4 - Bedbound  Vitals:   05/07/23 1124 05/07/23 1200  BP: 113/77 99/72  Pulse: 77 76  Resp: 19 18  Temp:  100.2 F (37.9 C)  SpO2: 98% 97%   Filed Weights   05/05/23 0500 05/06/23 0500 05/07/23 0500  Weight: 186 lb 4.6 oz (84.5 kg) 184 lb 8.4 oz (83.7 kg) 184 lb 15.5 oz (83.9 kg)    GENERAL: + Intubated  SKIN: skin color, texture, turgor are normal, no rashes or significant lesions EYES: normal, conjunctiva are pink and non-injected, sclera clear OROPHARYNX: no exudate, no erythema and lips, buccal mucosa, and tongue normal  NECK: supple, thyroid normal size, non-tender, without nodularity LYMPH: no palpable lymphadenopathy in the cervical, axillary or inguinal LUNGS: clear to auscultation and percussion with normal breathing effort HEART: regular rate & rhythm and no murmurs and no lower extremity edema ABDOMEN: abdomen soft, non-tender and normal bowel sounds MUSCULOSKELETAL: no cyanosis of digits and no clubbing  PSYCH: + Intubated  NEURO: no focal motor/sensory deficits   All questions were answered. The patient knows to call the clinic with any problems, questions or concerns.   The total time spent in the appointment was 30 minutes encounter with patient including review of chart and various tests results, discussions  about plan of care and coordination of care plan  Dawson Bills, NP 05/07/2023 1:38 PM    Labs Reviewed:  Lab Results  Component Value Date   WBC 3.7 (L) 05/07/2023   HGB 12.0 (L) 05/07/2023   HCT 35.6 (L) 05/07/2023   MCV 90.4 05/07/2023   PLT 194 05/07/2023   Recent Labs    08/11/22 0316 08/11/22 1544 08/12/22 0515 08/12/22 1400 08/20/22 0302 08/21/22 0334 04/28/23 1120 04/29/23 0432 05/05/23 0500 05/06/23 0549 05/07/23 0541  NA 134*   < > 137   < > 137   < > 138   < > 135 134* 133*  K 3.7   < > 3.3*   < > 4.0   < > 4.2   < > 3.9 3.9 4.4  CL 99   < > 101   < > 102   < > 100   < > 102 101 98  CO2 25   < > 28   < > 24   < > 29   < >  24 23 25   GLUCOSE 185*   < > 114*   < > 78   < > 105*   < > 118* 149* 119*  BUN 52*   < > 41*   < > 45*   < > 18   < > 26* 25* 24*  CREATININE 1.58*   < > 1.44*   < > 3.38*   < > 1.07   < > 0.70 0.76 0.83  CALCIUM 7.4*   < > 7.5*   < > 7.2*   < > 8.2*   < > 7.9* 8.1* 8.4*  GFRNONAA 47*   < > 52*   < > 19*   < > >60   < > >60 >60 >60  PROT 4.6*  --  4.2*  --  3.9*   < > 5.1*  --   --  4.9* 5.6*  ALBUMIN 1.8*  1.7*   < > 1.7*  1.6*   < > 1.7*   < > 2.7*  --   --  2.1* 2.3*  AST 33  --  31  --  25   < > 21  --   --  19 19  ALT 31  --  31  --  18   < > 34  --   --  28 30  ALKPHOS 84  --  79  --  111   < > 51  --   --  46 51  BILITOT 3.2*  --  2.5*  --  0.8   < > 1.1  --   --  0.5 0.4  BILIDIR 1.7*  --  1.3*  --  0.2  --   --   --   --   --   --   IBILI 1.5*  --  1.2*  --  0.6  --   --   --   --   --   --    < > = values in this interval not displayed.    Studies Reviewed:  MR BRAIN WO CONTRAST Result Date: 05/07/2023 CLINICAL DATA:  Stroke follow-up EXAM: MRI HEAD WITHOUT CONTRAST TECHNIQUE: Multiplanar, multiecho pulse sequences of the brain and surrounding structures were obtained without intravenous contrast. COMPARISON:  04/24/2023, 04/07/2023 FINDINGS: Brain: Resolving diffusion abnormality within the posterior right MCA territory. No  acute infarct. Unchanged appearance of intraparenchymal hemorrhage in the posterior right parietal lobe. Confluent hyperintense T2-weighted signal within the posterior right hemisphere now extends more anteriorly with greater involvement of the anterior right temporal lobe, right insula and frontal operculum. There is multifocal hyperintense T2-weighted signal within the white matter. Generalized volume loss. The midline structures are normal. Vascular: Normal flow voids. Skull and upper cervical spine: Normal calvarium and skull base. Visualized upper cervical spine and soft tissues are normal. Sinuses/Orbits:Moderate paranasal sinus mucosal thickening. Normal orbits. IMPRESSION: 1. Resolving diffusion abnormality within the posterior right MCA territory. 2. Increased extent of hyperintense T2-weighted signal in the right hemisphere. This could indicate progression of lymphoma, or confluence of post-ischemic/post-ictal edema cytotoxic edema and lymphoma-related vasogenic edema. Compared to 04/07/2023, however, the area of abnormal signal has decreased. 3. Unchanged appearance of intraparenchymal hemorrhage in the posterior right parietal lobe. Electronically Signed   By: Deatra Robinson M.D.   On: 05/07/2023 00:52   VAS [place] LOWER EXTREMITY VENOUS (DVT) Result Date: 05/06/2023  Lower Venous DVT Study Patient Name:  BREVON DEWALD  Date of Exam:   05/06/2023 Medical Rec #: [id number]  Accession #:    [id number] Date of Birth: Jul 11, 1950     Patient Gender: M Patient Age:   73 years Exam Location:  [facility] Procedure:      VAS [place] LOWER EXTREMITY VENOUS (DVT) Referring Phys: Chilton Greathouse --------------------------------------------------------------------------------  Indications: Swelling, and Edema. Other Indications: A-fib, H/O P.E. Comparison Study: Previous study on 7.12.2021. Performing Technologist: Fernande Bras  Examination Guidelines: A complete evaluation includes B-mode imaging, spectral  Doppler, color Doppler, and power Doppler as needed of all accessible portions of each vessel. Bilateral testing is considered an integral part of a complete examination. Limited examinations for reoccurring indications may be performed as noted. The reflux portion of the exam is performed with the patient in reverse Trendelenburg.  +---------+---------------+---------+-----------+----------+--------------+ RIGHT    CompressibilityPhasicitySpontaneityPropertiesThrombus Aging +---------+---------------+---------+-----------+----------+--------------+ CFV      Full           Yes      Yes                                 +---------+---------------+---------+-----------+----------+--------------+ SFJ      Full           Yes      Yes                                 +---------+---------------+---------+-----------+----------+--------------+ FV Prox  Full                                                        +---------+---------------+---------+-----------+----------+--------------+ FV Mid   Full                                                        +---------+---------------+---------+-----------+----------+--------------+ FV DistalFull                                                        +---------+---------------+---------+-----------+----------+--------------+ PFV      Full                                                        +---------+---------------+---------+-----------+----------+--------------+ POP      Full           Yes      Yes                                 +---------+---------------+---------+-----------+----------+--------------+ PTV      Full                                                        +---------+---------------+---------+-----------+----------+--------------+  PERO     Full                                                        +---------+---------------+---------+-----------+----------+--------------+    +---------+---------------+---------+-----------+----------+--------------+ LEFT     CompressibilityPhasicitySpontaneityPropertiesThrombus Aging +---------+---------------+---------+-----------+----------+--------------+ CFV      Full           Yes      Yes                                 +---------+---------------+---------+-----------+----------+--------------+ SFJ      Full           Yes      Yes                                 +---------+---------------+---------+-----------+----------+--------------+ FV Prox  Full                                                        +---------+---------------+---------+-----------+----------+--------------+ FV Mid   Full                                                        +---------+---------------+---------+-----------+----------+--------------+ FV DistalFull                                                        +---------+---------------+---------+-----------+----------+--------------+ PFV      Full                                                        +---------+---------------+---------+-----------+----------+--------------+ POP      Full           Yes      Yes                                 +---------+---------------+---------+-----------+----------+--------------+ PTV      Full                                                        +---------+---------------+---------+-----------+----------+--------------+ PERO     Full                                                        +---------+---------------+---------+-----------+----------+--------------+  Summary: BILATERAL: - No evidence of deep vein thrombosis seen in the lower extremities, bilaterally. -No evidence of popliteal cyst, bilaterally.   *See table(s) above for measurements and observations.    Preliminary    CT HEAD WO CONTRAST ( ) Addendum Date: 05/06/2023 ADDENDUM REPORT: 05/06/2023 14:37 ADDENDUM: Study discussed by telephone  with Dr. Lindie Spruce on 05/06/2023 at 1431 hours. Electronically Signed   By: Odessa Fleming M.D.   On: 05/06/2023 14:37   Result Date: 05/06/2023 CLINICAL DATA:  73 year old male with seizure. Right side weakness. Intubated. History of treated CNS lymphoma. EXAM: CT HEAD WITHOUT CONTRAST TECHNIQUE: Contiguous axial images were obtained from the base of the skull through the vertex without intravenous contrast. RADIATION DOSE REDUCTION: This exam was performed according to the departmental dose-optimization program which includes automated exposure control, adjustment of the mA and/or kV according to patient size and/or use of iterative reconstruction technique. COMPARISON:  Head CT 04/26/2023 and earlier. FINDINGS: Brain: Confluent cytotoxic edema now in the right MCA territory, including all of the insula, operculum, most of the posterior right MCA territory. No hemorrhagic transformation. No significant midline shift or intracranial mass effect. No ventriculomegaly. No acute intracranial hemorrhage identified. Superimposed chronic postoperative, post treatment changes to the posterior right parietal lobe with dystrophic calcification there, stable. Stable since 04/26/2023 gray-white differentiation elsewhere. Basilar cisterns are patent. Vascular: Calcified atherosclerosis at the skull base. No suspicious intracranial vascular hyperdensity. Skull: Stable appearance of right posterior parietal craniotomy. Sinuses/Orbits: Right nasoenteric tube remains in place. Increased sinus mucosal thickening and opacification from earlier this month. Tympanic cavities and mastoids remain well aerated. Other: EEG leads in place. No acute orbit or scalp soft tissue finding. IMPRESSION: 1. 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 No hemorrhagic transformation. No significant intracranial mass  effect. 2. Otherwise stable post treatment appearance of the brain, right parietal tumor resection. Electronically Signed: By: Odessa Fleming M.D. On: 05/06/2023 14:16   DG Chest Port 1 View Result Date: 05/06/2023 CLINICAL DATA:  Acute respiratory failure. EXAM: PORTABLE CHEST 1 VIEW COMPARISON:  Radiograph 05/03/2023 FINDINGS: Endotracheal tube tip 5.7 cm from the carina. Weighted enteric tube in place, tip not included in the field of view. Accessed right chest port in place. Stable heart size and mediastinal contours. No confluent airspace disease, large pleural effusion or pneumothorax. No pulmonary edema. Left second rib fracture. IMPRESSION: 1. Endotracheal tube tip 5.7 cm from the carina. 2. Weighted enteric tube in place, tip not included in the field of view. 3. No acute findings. Electronically Signed   By: Narda Rutherford M.D.   On: 05/06/2023 11:01   NM PET Image Restag (PS) Skull Base To Thigh Result Date: 05/05/2023 CLINICAL DATA:  Subsequent treatment strategy for large B-cell lymphoma. EXAM: NUCLEAR MEDICINE PET SKULL BASE TO THIGH TECHNIQUE: 9.1 mCi F-18 FDG was injected intravenously. Full-ring PET imaging was performed from the skull base to thigh after the radiotracer. CT data was obtained and used for attenuation correction and anatomic localization. Fasting blood glucose: 98 mg/dl COMPARISON:  CT abdomen pelvis 03/07/2023 and PET 12/27/2022. FINDINGS: Mediastinal blood pool activity: SUV max 2.5 Liver activity: SUV max 4.1 NECK: Focal hypermetabolism along the high left mandible or deep left parotid gland, SUV max 5.3, unchanged. No definite CT correlate. Right level III lymph node measures 7 mm (4/39), SUV max 2.0, minimally decreased from 3.7. Incidental CT findings: None.  CHEST: Axillary lymph nodes have decreased in hypermetabolism. Index 8 mm left axillary lymph node (4/54), SUV max 1.3, previously 2.7. No new abnormal hypermetabolism. Incidental CT findings: Right IJ Port-A-Cath  terminates in the right atrium. Heart is enlarged. No pericardial or pleural effusion. ABDOMEN/PELVIS: No abnormal hypermetabolism. Incidental CT findings: Probable tiny left hepatic lobe cyst. No specific follow-up necessary. Low-attenuation lesions in the kidneys. No specific follow-up necessary. Soft tissue thickening and stranding along the posterior retroperitoneum bilaterally, new in the interval but nonspecific. Left inguinal hernia repair. SKELETON: 1.5 cm lucent lesion posterior to the left acetabulum (4/175), SUV max 2.5, unchanged. No additional abnormal hypermetabolism. Incidental CT findings: Degenerative changes in the spine. IMPRESSION: 1. Interval response to therapy as evidenced by decrease in hypermetabolism involving cervical and axillary lymph nodes (Deauville 2). 2. Small lucent lesion posterior to the left acetabulum with associated borderline hypermetabolism, unchanged and nonspecific. Recommend attention on follow-up. 3. Minimal residual atelectasis or scarring in the posteromedial right lower lobe, improved from 12/27/2022. Electronically Signed   By: Leanna Battles M.D.   On: 05/05/2023 10:31   DG CHEST PORT 1 VIEW Result Date: 05/03/2023 CLINICAL DATA:  73 year old male intubated. EXAM: PORTABLE CHEST 1 VIEW COMPARISON:  Portable chest yesterday. FINDINGS: Portable AP upright view at 0923 hours. Mildly rotated to the right. Endotracheal tube tip in good position between the clavicles and carina. Enteric feeding tube courses to the left abdomen, tip not included. Stable right chest power port. Stable cardiac size and mediastinal contours. Stable lung volumes and ventilation. No pneumothorax, pulmonary edema, consolidation. Negative visible bowel gas. IMPRESSION: 1. Satisfactory endotracheal tube placement. Enteric feeding tube remains in place. 2.  No convincing acute cardiopulmonary abnormality. Electronically Signed   By: Odessa Fleming M.D.   On: 05/03/2023 12:57   DG Chest Port 1  View Result Date: 05/02/2023 CLINICAL DATA:  Shortness of breath.  History of lymphoma. EXAM: PORTABLE CHEST 1 VIEW COMPARISON:  Radiograph yesterday.  Chest CT 04/26/2023 FINDINGS: Right chest port in place. Weighted enteric tube tip below the diaphragm. Lung volumes are low. Stable heart size and mediastinal contours. Bibasilar atelectasis, left greater than right. No new airspace disease. No pleural fluid or pneumothorax. No pulmonary edema. IMPRESSION: Hypoventilatory chest with bibasilar atelectasis. Electronically Signed   By: Narda Rutherford M.D.   On: 05/02/2023 21:50   DG CHEST PORT 1 VIEW Result Date: 05/01/2023 CLINICAL DATA:  Fever EXAM: PORTABLE CHEST 1 VIEW COMPARISON:  04/27/2023 FINDINGS: Cardiomegaly. Right Port-A-Cath remains in place, unchanged. No confluent airspace opacities or effusions. No acute bony abnormality. IMPRESSION: No active disease. Electronically Signed   By: Charlett Nose M.D.   On: 05/01/2023 12:23   DG Swallowing Func-Speech Pathology Result Date: 04/28/2023 Table formatting from the original result was not included. Modified Barium Swallow Study Patient Details Name: Kirubel Aja MRN: [id number] Date of Birth: 09/18/1950 Today's Date: 04/28/2023 HPI/PMH: HPI: 73 yo male [facility] on [facility] with LUE and LE weakness, and change in mentation. Head CT of brain shows a recurrent tumor at the craniotomy site and increased size of recurrent tumor along with a left midline shift up to 6 mm. 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 Pt's swallowing was evaluated when he was admitted in June of 2024 - he was D/Cd on a dysphagia 3  diet with thin liquids with no f/u recommended. Pt's swallowing assessed again on 04/08/23 this admission with recommendation of a  regular diet and thin liquids. Hospital course complicated by concern for neck swelling, acute dysphagia, and concern for airway protection prompting intubation from 02/28-03/02. Neck CT on 04/26/23: demonstrated "No acute finding. Assessment of mucosal surfaces limited by airway  collapse in the setting of intubation.". Clinical Impression: Pt presents with a moderate-severe oral dysphagia and a mild pharyngeal dysphagia per results of MBSS completed today. Oral deficits included poor bolus awareness, oral holding, reduced labial seal resulting in anterior spillage, poor lingual movement or initiation for oral transit, and reduced oral coordination resulting in posterior loss of trials. Oral deficits were more pronounced with nectar-thick liquid, puree, and soft solid trials. Soft solid trial was removed from oral cavity due to minimal-no mastication efforts.  Pt required multiple verbal and tactile cues to eventually initiate oral transit of nectar-thick and puree trials. Tactile cues included downward pressure of spoon on tongue multiple times. Pharyngeal dysphagia characterized by reduced hyolaryngeal elevation, brief mistiming of pharyngeal swallow initiation, reduced base of tongue retraction, reduced laryngeal vestibule closure, and reduced pharyngeal stripping. Findings: -There was x1 instance of trace-min silent aspiration of thin liquids (via straw) before the swallow. Aspiration occurred posteriorly from the pyriform sinuses due to pooling of liquids before swallow was initiated. Aspiration event was completely silent without cough, water eyes, increased RR, or any other subtle s/s of aspiration. -There was intermittent, trace, shallow, transient penetration of nectar-thick and thin liquids by cup. -There was shallow penetration without ejection of mixture of thin and nectar-thick residue towards end of study. Penetrated residue appeared to move slowly towards the airway, though remained above the vocal  cords during assessment. Pt unable to follow directions for strong cough to attempt to clear residue. Diet recommendations outlined below. SLP will follow up for ongoing diet tolerance assessment and therapeutic PO trials of pureed solids to address oral deficits. Recommend continue tube feeds via Cortrak for nutritional support and medication delivery. Factors that may increase risk of adverse event in presence of aspiration Rubye Oaks & Clearance Coots 2021): Factors that may increase risk of adverse event in presence of aspiration Rubye Oaks & Clearance Coots 2021): Reduced cognitive function; Presence of tubes (ETT, trach, NG, etc.); Weak cough; Dependence for feeding and/or oral hygiene; Inadequate oral hygiene Recommendations/Plan: Swallowing Evaluation Recommendations Swallowing Evaluation Recommendations Recommendations: PO diet PO Diet Recommendation: Clear liquid diet; Thin liquids (Level 0) (ok for any thin liquids) Liquid Administration via: Cup; No straw Medication Administration: Via alternative means Supervision: Staff to assist with self-feeding; Full supervision/cueing for swallowing strategies Swallowing strategies  : Minimize environmental distractions; Small bites/sips; Check for anterior loss Postural changes: Position pt fully upright for meals; Stay upright 30-60 min after meals Oral care recommendations: Oral care QID (4x/day); Staff/trained caregiver to provide oral care; Use suctioning for oral care Caregiver Recommendations: Have oral suction available Treatment Plan Treatment Plan Treatment recommendations: Therapy as outlined in treatment plan below Follow-up recommendations: Follow physicians's recommendations for discharge plan and follow up therapies (SLP services at next level of care) Functional status assessment: Patient has had a recent decline in their functional status and demonstrates the ability to make significant improvements in function in a reasonable and predictable amount of time. Treatment  frequency: Min 2x/week Treatment duration: 2 weeks Interventions: Aspiration precaution training; Compensatory techniques; Patient/family education; Trials of upgraded texture/liquids; Diet toleration management by SLP; Oropharyngeal exercises Recommendations Recommendations for follow up therapy are one  component of a multi-disciplinary discharge planning process, led by the attending physician.  Recommendations may be updated based on patient status, additional functional criteria and insurance authorization. Assessment: Orofacial Exam: Orofacial Exam Oral Cavity: Oral Hygiene: WFL Oral Cavity - Dentition: Adequate natural dentition Orofacial Anatomy: WFL Oral Motor/Sensory Function: Suspected cranial nerve impairment (limited assessment, though concern for oral apraxia) Anatomy: Anatomy: WFL Boluses Administered: Boluses Administered Boluses Administered: Mildly thick liquids (Level 2, nectar thick); Thin liquids (Level 0); Puree; Solid  Oral Impairment Domain: Oral Impairment Domain Lip Closure: Escape beyond mid-chin Tongue control during bolus hold: Posterior escape of greater than half of bolus Bolus preparation/mastication: Minimal chewing/mashing with majority of bolus unchewed (removed from oral cavity) Bolus transport/lingual motion: Minimal-no tongue motion Oral residue: Majority of bolus remaining Location of oral residue : Floor of mouth; Tongue Initiation of pharyngeal swallow : Pyriform sinuses  Pharyngeal Impairment Domain: Pharyngeal Impairment Domain Soft palate elevation: No bolus between soft palate (SP)/pharyngeal wall (PW) Laryngeal elevation: Partial superior movement of thyroid cartilage/partial approximation of arytenoids to epiglottic petiole Anterior hyoid excursion: Complete anterior movement Epiglottic movement: Complete inversion Laryngeal vestibule closure: Incomplete, narrow column air/contrast in laryngeal vestibule Pharyngeal stripping wave : Present - diminished Pharyngeal  contraction (A/P view only): N/A Pharyngoesophageal segment opening: Complete distension and complete duration, no obstruction of flow Tongue base retraction: Trace column of contrast or air between tongue base and PPW Pharyngeal residue: Collection of residue within or on pharyngeal structures Location of pharyngeal residue: Tongue base; Valleculae  Esophageal Impairment Domain: No data recorded Pill: No data recorded Penetration/Aspiration Scale Score: Penetration/Aspiration Scale Score 1.  Material does not enter airway: Thin liquids (Level 0); Mildly thick liquids (Level 2, nectar thick); Puree 2.  Material enters airway, remains ABOVE vocal cords then ejected out: Thin liquids (Level 0); Mildly thick liquids (Level 2, nectar thick) 3.  Material enters airway, remains ABOVE vocal cords and not ejected out: Thin liquids (Level 0); Mildly thick liquids (Level 2, nectar thick) 8.  Material enters airway, passes BELOW cords without attempt by patient to eject out (silent aspiration) : Thin liquids (Level 0) Compensatory Strategies: Compensatory Strategies Compensatory strategies: Yes Straw: Ineffective Ineffective Straw: Thin liquid (Level 0) Other(comment): Ineffective (tactile pressure with spoon in effort to initiate oral transit) Ineffective Other(comment): Mildly thick liquid (Level 2, nectar thick); Moderately thick liquid (Level 3, honey thick)   General Information: Caregiver present: No (follow up education completed on phone with daughter)  Diet Prior to this Study: NPO; Cortrak/Small bore NG tube   Temperature : Febrile   Respiratory Status: WFL   Supplemental O2: Nasal cannula (2L)   History of Recent Intubation: Yes  Behavior/Cognition: Alert; Cooperative; Pleasant mood Self-Feeding Abilities: Needs set-up for self-feeding Baseline vocal quality/speech: -- (intermittent vocal wetness) Volitional Cough: Unable to elicit Volitional Swallow: Unable to elicit Exam Limitations: No limitations Goal Planning:  Prognosis for improved oropharyngeal function: Fair Barriers to Reach Goals: Cognitive deficits No data recorded Patient/Family Stated Goal: drink water Consulted and agree with results and recommendations: Patient; Family member/caregiver; Nurse Pain: Pain Assessment Pain Assessment: No/denies pain Breathing: 0 Negative Vocalization: 0 Facial Expression: 0 Body Language: 1 Consolability: 0 PAINAD Score: 1 Facial Expression: 0 Body Movements: 0 Muscle Tension: 0 Compliance with ventilator (intubated pts.): N/A Vocalization (extubated pts.): 0 CPOT Total: 0 End of Session: Start Time:SLP Start Time (ACUTE ONLY): 0900 Stop Time: SLP Stop Time (ACUTE ONLY): 0930 Time Calculation:SLP Time Calculation (min) (ACUTE ONLY): 30 min Charges: SLP Evaluations $ SLP  Speech Visit: 1 Visit SLP Evaluations $BSS Swallow: 1 Procedure $MBS Swallow: 1 Procedure SLP visit diagnosis: SLP Visit Diagnosis: Dysphagia, oropharyngeal phase (R13.12) Past Medical History: Past Medical History: Diagnosis Date  Cancer (HCC)   prostate  Follicular lymphoma (HCC) 2024  History of pulmonary embolism   Hypertension   Myelodysplastic syndrome (HCC)   PAF (paroxysmal atrial fibrillation) (HCC)   Recurrent UTI  Past Surgical History: Past Surgical History: Procedure Laterality Date  APPLICATION OF CRANIAL NAVIGATION Right 03/10/2023  Procedure: APPLICATION OF CRANIAL NAVIGATION;  Surgeon: Tressie Stalker, MD;  Location: [facility] OR;  Service: Neurosurgery;  Laterality: Right;  BIOPSY  08/21/2022  Procedure: BIOPSY;  Surgeon: Beverley Fiedler, MD;  Location: [facility] ENDOSCOPY;  Service: Gastroenterology;;  CRANIOTOMY Right 03/10/2023  Procedure: PARIETAL CRANIOTOMY;  Surgeon: Tressie Stalker, MD;  Location: [facility] OR;  Service: Neurosurgery;  Laterality: Right;  ESOPHAGOGASTRODUODENOSCOPY (EGD) WITH PROPOFOL N/A 08/21/2022  Procedure: ESOPHAGOGASTRODUODENOSCOPY (EGD) WITH PROPOFOL;  Surgeon: Beverley Fiedler, MD;  Location: [facility] ENDOSCOPY;  Service: Gastroenterology;   Laterality: N/A;  IR IMAGING GUIDED PORT INSERTION  08/26/2022  LYMPHADENECTOMY Bilateral 09/01/2019  Procedure: LYMPHADENECTOMY;  Surgeon: Sebastian Ache, MD;  Location: [facility] ORS;  Service: Urology;  Laterality: Bilateral;  ROBOT ASSISTED LAPAROSCOPIC RADICAL PROSTATECTOMY N/A 09/01/2019  Procedure: XI ROBOTIC ASSISTED LAPAROSCOPIC RADICAL PROSTATECTOMY;  Surgeon: Sebastian Ache, MD;  Location: [facility] ORS;  Service: Urology;  Laterality: N/A;  3 HRS [name] 04/28/2023, 11:06 AM  Overnight EEG with video Result Date: 04/28/2023 Charlsie Quest, MD     04/29/2023 10:44 AM Patient Name: Abiel Antrim MRN: [id number] Epilepsy Attending: Charlsie Quest Referring Physician/Provider: Gordy Councilman, MD Duration: 04/27/2023 1608 to 04/28/2023 1608 Patient history: 73yo M patient with 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 EEG to evaluate for seizure Level of alertness: Awake/lethargic AEDs during EEG study: LEV, LCM Technical aspects: This EEG study was done with scalp electrodes positioned according to the 10-20 International system of electrode placement. Electrical activity was reviewed with band pass filter of 1-70Hz , sensitivity of 7 uV/mm, display speed of 54mm/sec with a 60Hz  notched filter applied as appropriate. EEG data were recorded continuously and digitally stored.  Video monitoring was available and reviewed as appropriate. Description: No clear posterior dominant rhythm was seen.  EEG showed continuous generalized and maximal right parietal 3 to 6 Hz theta-delta slowing. Lateralized periodic discharges were noted in right hemisphere, maximal right parietal region at 1 Hz, at times with overlying rhythmicity. Seizures without clinical signs were noted arising from right parietal region, average 5 seizures per hour, lasting 1 to 2 minutes each.  During seizure EEG showed 5 to 6 Hz theta slowing admixed with sharp waves and right parietal region which then involved all of right hemisphere and  evolved into 2 to 3 Hz delta slowing.  IV Vimpat was administered on 04/28/2023 at around 1042. Gradually after around 1300, EEG improved and showed lateralized periodic discharges in right hemisphere, maximal right parietal region at 1hz  with overlying rhythmicity.  Hyperventilation and photic stimulation were not performed.   ABNORMALITY -Seizure without clinical signs, right parietal region -Lateralized periodic discharges with overlying rhythmicity, right hemisphere, maximal right parietal  region ( LPD+R) -Continuous slow, generalized and maximal right parietal region IMPRESSION: This study showed seizures without clinical signs arising from right parietal region, average 5 seizures per hour lasting 1 to 2 minutes each.  IV Vimpat was administered on 04/28/2023 at around 1042.  Gradually after around 1300, EEG improved and no  definite seizures were noted. Additionally there is evidence of epileptogenicity and cortical dysfunction arising from right hemisphere, maximal right parietal region likely secondary to underlying structural abnormality.  Charlsie Quest   DG CHEST PORT 1 VIEW Result Date: 04/27/2023 CLINICAL DATA:  Acute hypoxemic respiratory failure. EXAM: PORTABLE CHEST 1 VIEW COMPARISON:  Chest radiograph dated 04/26/2023. FINDINGS: The heart is enlarged. An endotracheal tube terminates in the midthoracic trachea. An enteric tube enters the stomach and terminates below the field of view. A right internal jugular central venous port catheter tip overlies the right atrium. There is mild bibasilar atelectasis/airspace disease, similar to prior exam. IMPRESSION: Mild bibasilar atelectasis/airspace disease, similar to prior exam. Electronically Signed   By: Romona Curls M.D.   On: 04/27/2023 14:57   ECHOCARDIOGRAM COMPLETE Result Date: 04/26/2023    ECHOCARDIOGRAM REPORT   Patient Name:   TEAGHAN FORMICA Date of Exam: 04/26/2023 Medical Rec #:  [id number]    Height:       67.0 in Accession #:    [id number]    Weight:       184.1 lb Date of Birth:  1950-11-06    BSA:          1.952 m Patient Age:    72 years     BP:           151/88 mmHg Patient Gender: M            HR:           48 bpm. Exam Location:  Inpatient Procedure: 2D Echo, Color Doppler and Cardiac Doppler (Both Spectral and Color            Flow Doppler were utilized during procedure). Indications:    Resp Distress  History:        Patient has prior history of Echocardiogram examinations, most                 recent 10/03/2022. Chemo; Risk Factors:Hypertension.  Sonographer:    Amy Chionchio Referring Phys: [id number] MURALI RAMASWAMY IMPRESSIONS  1. Left ventricular ejection fraction, by estimation, is 60 to 65%. The left ventricle has normal function. The left ventricle has no regional wall motion abnormalities. There is mild left ventricular hypertrophy. Left ventricular diastolic parameters are indeterminate.  2. Right ventricular systolic function is normal. The right ventricular size is normal. Tricuspid regurgitation signal is inadequate for assessing PA pressure.  3. The mitral valve is normal in structure. Trivial mitral valve regurgitation. No evidence of mitral stenosis.  4. The aortic valve is tricuspid. Aortic valve regurgitation is not visualized. No aortic stenosis is present.  5. The inferior vena cava is dilated in size with <50% respiratory variability, suggesting right atrial pressure of 15 mmHg. FINDINGS  Left Ventricle: Left ventricular ejection fraction, by estimation, is 60 to 65%. The left ventricle has normal function. The left ventricle has no regional wall motion abnormalities. Strain imaging was not performed. The left ventricular internal cavity  size was normal in size. There is mild left ventricular hypertrophy. Left ventricular diastolic parameters are indeterminate. Right Ventricle: The right ventricular size is normal. No increase in right ventricular wall thickness. Right ventricular systolic function is normal. Tricuspid  regurgitation signal is inadequate for assessing PA pressure. Left Atrium: Left atrial size was normal in size. Right Atrium: Right atrial size was normal in size. Pericardium: There is no evidence of pericardial effusion. Mitral Valve: The mitral valve is normal in structure. Trivial mitral valve regurgitation. No evidence of  mitral valve stenosis. MV peak gradient, 2.4 mmHg. The mean mitral valve gradient is 1.0 mmHg. Tricuspid Valve: The tricuspid valve is normal in structure. Tricuspid valve regurgitation is trivial. Aortic Valve: The aortic valve is tricuspid. Aortic valve regurgitation is not visualized. No aortic stenosis is present. Aortic valve mean gradient measures 3.0 mmHg. Aortic valve peak gradient measures 5.7 mmHg. Aortic valve area, by VTI measures 2.11 cm. Pulmonic Valve: The pulmonic valve was grossly normal. Pulmonic valve regurgitation is not visualized. Aorta: The aortic root and ascending aorta are structurally normal, with no evidence of dilitation. Venous: The inferior vena cava is dilated in size with less than 50% respiratory variability, suggesting right atrial pressure of 15 mmHg. IAS/Shunts: The interatrial septum was not well visualized. Additional Comments: 3D imaging was not performed.  LEFT VENTRICLE PLAX 2D LVIDd:         4.20 cm     Diastology LVIDs:         2.70 cm     LV e' medial:    5.11 cm/s LV PW:         1.10 cm     LV E/e' medial:  14.0 LV IVS:        0.90 cm     LV e' lateral:   7.94 cm/s LVOT diam:     2.20 cm     LV E/e' lateral: 9.0 LV SV:         58 LV SV Index:   30 LVOT Area:     3.80 cm  LV Volumes (MOD) LV vol d, MOD A2C: 69.9 ml LV vol d, MOD A4C: 90.8 ml LV vol s, MOD A2C: 23.8 ml LV vol s, MOD A4C: 29.9 ml LV SV MOD A2C:     46.1 ml LV SV MOD A4C:     90.8 ml LV SV MOD BP:      53.6 ml RIGHT VENTRICLE             IVC RV Basal diam:  3.10 cm     IVC diam: 2.20 cm RV S prime:     14.90 cm/s TAPSE (M-mode): 1.7 cm LEFT ATRIUM             Index        RIGHT  ATRIUM          Index LA Vol (A2C):   31.3 ml 16.03 ml/m  RA Area:     9.52 cm LA Vol (A4C):   54.0 ml 27.66 ml/m  RA Volume:   14.30 ml 7.32 ml/m LA Biplane Vol: 43.6 ml 22.33 ml/m  AORTIC VALVE                    PULMONIC VALVE AV Area (Vmax):    2.48 cm     PV Vmax:       0.77 m/s AV Area (Vmean):   2.33 cm     PV Peak grad:  2.4 mmHg AV Area (VTI):     2.11 cm AV Vmax:           119.00 cm/s AV Vmean:          78.400 cm/s AV VTI:            0.275 m AV Peak Grad:      5.7 mmHg AV Mean Grad:      3.0 mmHg LVOT Vmax:         77.50 cm/s LVOT Vmean:  48.100 cm/s LVOT VTI:          0.153 m LVOT/AV VTI ratio: 0.56  AORTA Ao Root diam: 3.30 cm Ao Asc diam:  3.40 cm MITRAL VALVE MV Area (PHT): 2.70 cm    SHUNTS MV Area VTI:   2.10 cm    Systemic VTI:  0.15 m MV Peak grad:  2.4 mmHg    Systemic Diam: 2.20 cm MV Mean grad:  1.0 mmHg MV Vmax:       0.77 m/s MV Vmean:      35.3 cm/s MV Decel Time: 281 msec MV E velocity: 71.70 cm/s MV A velocity: 83.10 cm/s MV E/A ratio:  0.86 Epifanio Lesches MD Electronically signed by Epifanio Lesches MD Signature Date/Time: 04/26/2023/2:18:08 PM    Final    DG CHEST PORT 1 VIEW Result Date: 04/26/2023 CLINICAL DATA:  Endotracheal tube present. EXAM: PORTABLE CHEST 1 VIEW COMPARISON:  Radiograph yesterday.  CT earlier today FINDINGS: Endotracheal tube tip 4.6 cm from the carina. Weighted enteric tube tip below the diaphragm not included in the field of view. Right chest port unchanged in position. Stable heart size and mediastinal contours. Left lung base opacity has progressed from yesterday's radiograph, subtotal lower lobe collapse on CT. Additional streaky atelectasis in the left mid lung. Right lower lobe opacity on CT has no definite radiographic correlate. No pneumothorax or large pleural effusion. IMPRESSION: 1. Endotracheal tube tip 4.6 cm from the carina. 2. Left lung base opacity has progressed from yesterday's radiograph, subtotal lower lobe collapse on  this morning's CT. Additional streaky atelectasis in the left mid lung. Electronically Signed   By: Narda Rutherford M.D.   On: 04/26/2023 13:27   CT CHEST W CONTRAST Result Date: 04/26/2023 CLINICAL DATA:  Head neck cancer.  Airway compromise with intubation EXAM: CT CHEST WITH CONTRAST TECHNIQUE: Multidetector CT imaging of the chest was performed during intravenous contrast administration. RADIATION DOSE REDUCTION: This exam was performed according to the departmental dose-optimization program which includes automated exposure control, adjustment of the mA and/or kV according to patient size and/or use of iterative reconstruction technique. CONTRAST:  OMNIPAQUE IOHEXOL 300 MG/ML  SOLN COMPARISON:  Head CT 04/16/2023 FINDINGS: Cardiovascular: Normal heart size. No pericardial effusion. Atheromatous calcification of the coronary arteries. Porta catheter with tip at the upper right atrium. Mediastinum/Nodes: Located endotracheal and enteric tube. No mass or adenopathy. Lungs/Pleura: Multi segment opacification in the lower lobes with volume loss, worse on the left. No consolidation, edema, effusion, or pneumothorax. Upper Abdomen: Enteric tube with tip at the pylorus. No acute finding Musculoskeletal: Thoracic spondylosis.  No acute finding IMPRESSION: Multi segment atelectasis in the lower lobes Electronically Signed   By: Tiburcio Pea M.D.   On: 04/26/2023 06:31   CT HEAD WO CONTRAST ( ) Result Date: 04/26/2023 CLINICAL DATA:  Mental status change.  History of CNS lymphoma. EXAM: CT HEAD WITHOUT CONTRAST TECHNIQUE: Contiguous axial images were obtained from the base of the skull through the vertex without intravenous contrast. RADIATION DOSE REDUCTION: This exam was performed according to the departmental dose-optimization program which includes automated exposure control, adjustment of the mA and/or kV according to patient size and/or use of iterative reconstruction technique. COMPARISON:  Brain  MRI 04/24/2023 FINDINGS: Brain: Cortical edema at the right insula correlating with abnormality on prior brain MRI. Small area of blood products in the parasagittal right parietal lobe with adjacent swelling, operative site. No evidence of interval infarct, hydrocephalus, or collection. Vascular: No hyperdense vessel or unexpected calcification.  Skull: Unremarkable right parietal craniotomy site Sinuses/Orbits: Negative IMPRESSION: No acute finding when compared to 04/24/2023 brain MRI. Electronically Signed   By: Tiburcio Pea M.D.   On: 04/26/2023 05:09   CT SOFT TISSUE NECK W CONTRAST Result Date: 04/26/2023 CLINICAL DATA:  Soft tissue infection suspected. Neck swelling and airway compromise EXAM: CT NECK WITH CONTRAST TECHNIQUE: Multidetector CT imaging of the neck was performed using the standard protocol following the bolus administration of intravenous contrast. RADIATION DOSE REDUCTION: This exam was performed according to the departmental dose-optimization program which includes automated exposure control, adjustment of the mA and/or kV according to patient size and/or use of iterative reconstruction technique. CONTRAST:  OMNIPAQUE IOHEXOL 300 MG/ML  SOLN COMPARISON:  04/16/2023 head CT FINDINGS: Pharynx and larynx: The airway is collapsed around endotracheal tube and enteric tube with fluid layering in the nasopharynx. This limits assessment of the mucosal surfaces, no evidence of submucosal edema. Salivary glands: No inflammation, mass, or stone. Thyroid: Normal. Lymph nodes: None enlarged or heterogeneous. Vascular: No acute finding Limited intracranial: Negative Visualized orbits: Negative Mastoids and visualized paranasal sinuses: Clear Skeleton: No acute or aggressive finding. Upper chest: Clear apical lungs. IMPRESSION: No acute finding. Assessment of mucosal surfaces limited by airway collapse in the setting of intubation. Electronically Signed   By: Tiburcio Pea M.D.   On: 04/26/2023  05:03   DG Abd 1 View Result Date: 04/26/2023 CLINICAL DATA:  Check gastric catheter placement EXAM: ABDOMEN - 1 VIEW COMPARISON:  None Available. FINDINGS: Weighted feeding catheter is noted in the distal stomach directed towards the pole or is. No free air is seen. No obstructive changes are noted. IMPRESSION: Feeding catheter as described. Electronically Signed   By: Alcide Clever M.D.   On: 04/26/2023 02:11   DG CHEST PORT 1 VIEW Result Date: 04/25/2023 CLINICAL DATA:  Check endotracheal tube placement EXAM: PORTABLE CHEST 1 VIEW COMPARISON:  Film from earlier in the same day. FINDINGS: Endotracheal tube is noted 5.3 cm above the carina. Gastric catheter extends into the stomach. Right chest wall port is again seen. Cardiac shadow is stable. No focal infiltrate is noted. Mild basilar atelectasis is seen. IMPRESSION: Tubes and lines as described. Electronically Signed   By: Alcide Clever M.D.   On: 04/25/2023 21:44   DG Chest Port 1 View Result Date: 04/25/2023 CLINICAL DATA:  Altered mental status EXAM: PORTABLE CHEST 1 VIEW COMPARISON:  04/10/2023 FINDINGS: Cardiac shadow is enlarged but stable. Right chest wall port is again seen. Mild basilar atelectasis is seen. No bony abnormality is noted. IMPRESSION: Mild basilar atelectasis. Electronically Signed   By: Alcide Clever M.D.   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 History of B-cell lymphoma EXAM: MRI HEAD WITHOUT AND WITH CONTRAST TECHNIQUE: Multiplanar, multiecho pulse sequences of the brain and surrounding structures were obtained without and with intravenous contrast. CONTRAST:  8mL GADAVIST GADOBUTROL 1 MMOL/ML IV SOLN COMPARISON:  Comparison made with previous MRI from 04/07/2023 as well as earlier studies. FINDINGS: Brain: Postoperative changes from prior right parietal craniotomy again seen. Patient's known recurrent lymphoma  at the right parietal convexity again seen. Size of the lesion is markedly decreased from prior, now measuring 2.9 x 3.5 x 2.3 cm (series 18, image 10). Superimposed susceptibility artifact consistent with blood products. Persistent but improved vasogenic edema within the adjacent posterior right cerebral hemisphere, with improved/resolved mass effect on the  right lateral ventricle. Previously seen right to left shift has resolved. Findings consistent with interval response to therapy. Now seen is restricted diffusion involving the right insular cortex and overlying posterior right frontal region (series 5, images 26, 30). Associated gyral swelling with T2/FLAIR signal abnormality within this location (series 9, image 28). No associated enhancement. 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 No other mass lesion or abnormal enhancement. No other acute or subacute infarct. No hydrocephalus or extra-axial fluid collection. Underlying cerebral white matter disease noted, stable. Vascular: Major intracranial vascular flow voids are maintained. Skull and upper cervical spine: Craniocervical junction within normal limits. Bone marrow signal intensity within normal limits. Post craniotomy changes at the right parietal calvarium without adverse features. Sinuses/Orbits: Globes and orbital soft tissues within normal limits. Mild mucosal thickening present about the ethmoidal air cells and maxillary sinuses. No mastoid effusion. Other: None. IMPRESSION: 1. Marked interval decrease in size of right parietal mass/lymphoma, now measuring 2.9 x 3.5 x 2.3 cm (previously 5.4 x 3.6 x 4.4 cm), consistent with interval response to therapy. Persistent but improved vasogenic edema within the adjacent posterior right cerebral hemisphere, with interval resolution of previously seen right-to-left midline shift. 2. New diffusion signal abnormality involving the right insular cortex and overlying right frontal lobe, favored to  reflect sequelae of seizure. Correlation with symptomatology and EEG recommended. Evolving changes of subacute ischemia would be the primary differential consideration, but are felt to be less likely. Electronically Signed   By: Rise Mu M.D.   On: 04/25/2023 05:24   DG CHEST PORT 1 VIEW Result Date: 04/10/2023 CLINICAL DATA:  Shortness of breath.  History of lymphoma EXAM: PORTABLE CHEST 1 VIEW COMPARISON:  X-ray 08/11/2022.  PET-CT 12/27/2022 FINDINGS: Right IJ chest port with tip overlying the right atrium. The port is accessed. Widening of the mediastinum is again seen. Enlarged cardiac silhouette. Basilar atelectasis or scar. No pneumothorax. Film is underinflated. No edema or effusion. Degenerative changes of the spine IMPRESSION: Underinflation.  Basilar atelectasis or scar. Widened mediastinum. Similar previous. Please correlate with prior PET-CT 12/27/2022 demonstrating prominent mediastinal fat. Chest port Electronically Signed   By: Karen Kays M.D.   On: 04/10/2023 19:17   EEG adult Result Date: 04/10/2023 Charlsie Quest, MD     04/11/2023  8:38 AM Patient Name: Ebin Palazzi MRN: [id number] Epilepsy Attending: Charlsie Quest Referring Physician/Provider: Johney Maine, MD Date: 04/10/2023 Duration: 22.17 mins Patient history: 73yo M patient with rt parietal CNS lymphoma with intermittent left sided painful burning paresthesias and weakness. EEG to evaluate for seizure Level of alertness: Awake, asleep AEDs during EEG study: None Technical aspects: This EEG study was done with scalp electrodes positioned according to the 10-20 International system of electrode placement. Electrical activity was reviewed with band pass filter of 1-70Hz , sensitivity of 7 uV/mm, display speed of 1mm/sec with a 60Hz  notched filter applied as appropriate. EEG data were recorded continuously and digitally stored.  Video monitoring was available and reviewed as appropriate. Description: The posterior  dominant rhythm consists of 8-9 Hz activity of moderate voltage (25-35 uV) seen predominantly in posterior head regions, symmetric and reactive to eye opening and eye closing. Sleep was characterized by vertex waves, sleep spindles (12 to 14 Hz), maximal frontocentral region. EEG showed continuous sharply contoured 3 to 6 Hz theta-delta slowing in right parietal region consistent with breach artifact. Sharp waves were noted in right parietal region. Hyperventilation and photic stimulation were not performed.   ABNORMALITY - Lambert Mody  wave, right parietal region - Continuous slow,  right parietal region IMPRESSION: This study showed evidence of epileptogenicity arising from right parietal region. Additionally there is cortical dysfunction arising from right parietal region consistent with underlying craniotomy. No seizures were seen throughout the recording. Priyanka [name]

## 2023-05-07 NOTE — Procedures (Addendum)
 Patient Name: Tyler [name]  MRN: [id number]  Epilepsy Attending: Charlsie Quest  Referring Physician/Provider: Gordy Councilman, MD  Duration: 05/06/2023 1608 to 05/07/2023 1608   Patient history: 73yo M patient with 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 EEG to evaluate for seizure    Level of alertness: Awake, asleep   AEDs during EEG study: LEV, LCM, GBP   Technical aspects: This EEG study was done with scalp electrodes positioned according to the 10-20 International system of electrode placement. Electrical activity was reviewed with band pass filter of 1-70Hz , sensitivity of 7 uV/mm, display speed of 40mm/sec with a 60Hz  notched filter applied as appropriate. EEG data were recorded continuously and digitally stored.  Video monitoring was available and reviewed as appropriate.   Description: No clear posterior dominant rhythm was seen.  Sleep was characterized by sleep spindles (12 to 40 Hz), maximal frontocentral region.  EEG showed continuous generalized and maximal right parietal 3 to 6 Hz theta-delta slowing. Lateralized periodic discharges were noted in right hemisphere, maximal right parietal region with fluctuating frequency of 0.25-1, at times with overlying rhythmicity. Hyperventilation and photic stimulation were not performed.      ABNORMALITY -Lateralized periodic discharges with overlying rhythmicity, right hemisphere, maximal right parietal  region ( LPD+R) -Continuous slow, generalized and maximal right parietal region   IMPRESSION: This study showed evidence of epileptogenicity arising from right hemisphere, maximal right parietal region which is on the ictal-interictal continuum. Additionally there is cortical dysfunction in right hemisphere, maximal right parietal region likely secondary to underlying structural abnormality. Lastly there is moderate diffuse encephalopathy.  No definite seizures were noted.   [name] [name]

## 2023-05-07 NOTE — Telephone Encounter (Signed)
 Patient Product/process development scientist completed.    The patient is insured through [employer] and [facility] MEDICAID.     Ran test claim for entecavir (Baraclude) 0.5 mg and Requires Prior Authorization.   This test claim was processed through [facility]- copay amounts may vary at other pharmacies due to pharmacy/plan contracts, or as the patient moves through the different stages of their insurance plan.     Tyler [name], CPHT Pharmacy Technician III Certified Patient Advocate [facility] Patient Advocate Team Direct Number: [phone]  Fax: [phone]

## 2023-05-08 ENCOUNTER — Inpatient Hospital Stay (HOSPITAL_COMMUNITY)

## 2023-05-08 ENCOUNTER — Encounter (HOSPITAL_COMMUNITY)

## 2023-05-08 DIAGNOSIS — G9340 Encephalopathy, unspecified: Secondary | ICD-10-CM | POA: Diagnosis not present

## 2023-05-08 DIAGNOSIS — R569 Unspecified convulsions: Secondary | ICD-10-CM | POA: Diagnosis not present

## 2023-05-08 DIAGNOSIS — G40909 Epilepsy, unspecified, not intractable, without status epilepticus: Secondary | ICD-10-CM | POA: Diagnosis not present

## 2023-05-08 DIAGNOSIS — I10 Essential (primary) hypertension: Secondary | ICD-10-CM | POA: Diagnosis not present

## 2023-05-08 DIAGNOSIS — J9601 Acute respiratory failure with hypoxia: Secondary | ICD-10-CM | POA: Diagnosis not present

## 2023-05-08 DIAGNOSIS — C8339 Primary central nervous system lymphoma: Secondary | ICD-10-CM | POA: Diagnosis not present

## 2023-05-08 LAB — CULTURE, RESPIRATORY W GRAM STAIN: Gram Stain: NONE SEEN

## 2023-05-08 LAB — URINALYSIS, W/ REFLEX TO CULTURE (INFECTION SUSPECTED)
Bacteria, UA: NONE SEEN
Bilirubin Urine: NEGATIVE
Glucose, UA: NEGATIVE mg/dL
Hgb urine dipstick: NEGATIVE
Ketones, ur: NEGATIVE mg/dL
Leukocytes,Ua: NEGATIVE
Nitrite: NEGATIVE
Protein, ur: NEGATIVE mg/dL
Specific Gravity, Urine: >1.046 — ABNORMAL HIGH (ref 1.005–1.030)
pH: 6 (ref 5.0–8.0)

## 2023-05-08 LAB — COMPREHENSIVE METABOLIC PANEL
ALT: 38 U/L (ref 0–44)
AST: 23 U/L (ref 15–41)
Albumin: 2.2 g/dL — ABNORMAL LOW (ref 3.5–5.0)
Alkaline Phosphatase: 52 U/L (ref 38–126)
Anion gap: 6 (ref 5–15)
BUN: 25 mg/dL — ABNORMAL HIGH (ref 8–23)
CO2: 25 mmol/L (ref 22–32)
Calcium: 8.1 mg/dL — ABNORMAL LOW (ref 8.9–10.3)
Chloride: 99 mmol/L (ref 98–111)
Creatinine, Ser: 0.93 mg/dL (ref 0.61–1.24)
GFR, Estimated: >60 mL/min (ref >60)
Glucose, Bld: 185 mg/dL — ABNORMAL HIGH (ref 70–99)
Potassium: 4.4 mmol/L (ref 3.5–5.1)
Sodium: 130 mmol/L — ABNORMAL LOW (ref 135–145)
Total Bilirubin: 0.4 mg/dL (ref 0.0–1.2)
Total Protein: 5.4 g/dL — ABNORMAL LOW (ref 6.5–8.1)

## 2023-05-08 LAB — CBC
HCT: 34 % — ABNORMAL LOW (ref 39.0–52.0)
Hemoglobin: 11.4 g/dL — ABNORMAL LOW (ref 13.0–17.0)
MCH: 29.8 pg (ref 26.0–34.0)
MCHC: 33.5 g/dL (ref 30.0–36.0)
MCV: 88.8 fL (ref 80.0–100.0)
Platelets: 214 10*3/uL (ref 150–400)
RBC: 3.83 MIL/uL — ABNORMAL LOW (ref 4.22–5.81)
RDW: 13.1 % (ref 11.5–15.5)
WBC: 3.9 10*3/uL — ABNORMAL LOW (ref 4.0–10.5)
nRBC: 0 % (ref 0.0–0.2)

## 2023-05-08 LAB — GLUCOSE, CAPILLARY
Glucose-Capillary: 194 mg/dL — ABNORMAL HIGH (ref 70–99)
Glucose-Capillary: 143 mg/dL — ABNORMAL HIGH (ref 70–99)
Glucose-Capillary: 114 mg/dL — ABNORMAL HIGH (ref 70–99)
Glucose-Capillary: 122 mg/dL — ABNORMAL HIGH (ref 70–99)
Glucose-Capillary: 122 mg/dL — ABNORMAL HIGH (ref 70–99)
Glucose-Capillary: 143 mg/dL — ABNORMAL HIGH (ref 70–99)

## 2023-05-08 LAB — MRSA NEXT GEN BY PCR, NASAL: MRSA by PCR Next Gen: NOT DETECTED

## 2023-05-08 MED ORDER — PIPERACILLIN-TAZOBACTAM 3.375 G IVPB
3.3750 g | Freq: Three times a day (TID) | INTRAVENOUS | Status: AC
  Administered 2023-05-08 – 2023-05-13 (×15): 3.375 g via INTRAVENOUS
  Filled 2023-05-08 (×16): qty 50

## 2023-05-08 MED ORDER — IOHEXOL 350 MG/ML SOLN
75.0000 mL | Freq: Once | INTRAVENOUS | Status: AC | PRN
  Administered 2023-05-08: 75 mL via INTRAVENOUS

## 2023-05-08 MED ORDER — TENOFOVIR ALAFENAMIDE FUMARATE 25 MG PO TABS
25.0000 mg | ORAL_TABLET | Freq: Every day | ORAL | Status: DC
  Administered 2023-05-09 – 2023-05-22 (×13): 25 mg
  Filled 2023-05-08 (×15): qty 1

## 2023-05-08 NOTE — Progress Notes (Signed)
 Nutrition Follow-up  DOCUMENTATION CODES:   Not applicable  INTERVENTION:   Tube feeding via Cortrak tube: Osmolite 1.5 @ 55 ml/hr (1320 ml per day)  Prosource TF20 60 ml BID  Provides 2140 kcal, 122 gm protein, 1003 ml free water daily   NUTRITION DIAGNOSIS:   Inadequate oral intake related to  (seizures) as evidenced by NPO status. Ongoing.   GOAL:   Patient will meet greater than or equal to 90% of their needs Met with TF at goal   MONITOR:   TF tolerance, I & O's  REASON FOR ASSESSMENT:   Consult Enteral/tube feeding initiation and management (trickles)  ASSESSMENT:   73 y.o. male with PMH prostate cancer s/p prostatectomy in 2021, pulmonary embolism, hepatitis B exposure, and high-grade large B-cell lymphoma and completed 6 cycles of R-CHOP (chemo) with systemic remission in the past. 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  Pt discussed during ICU rounds and with RN and MD.  No family present during visit.  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 CTA pending.   2/25 - admitted  2/28 - intubated 3/1 - s/p cortrak placement (at [facility]); tip gastric per xray 3/2 - extubated 3/8 - re-intubated  Medications reviewed and include: Vitamin D3 2000 IU daily, Vitamin B12 1000 mcg daily, decadron, colace, folic acid, SSI every 4 hours, protonix, miralax, thiamine  Precedex  Labs reviewed:  Na 130 CBG's: 92-194  Current weight: 84.9 kg Admission Weight: 84.8 kg  UOP 1325 ml    Diet Order:   Diet Order             Diet NPO time specified  Diet effective now                   EDUCATION NEEDS:   Not appropriate for education at this time  Skin:  Skin Assessment: Reviewed RN Assessment  Last BM:  3/13 large; type 6  Height:   Ht Readings from Last 1 Encounters:  05/05/23 5' 7.01" (1.702 m)    Weight:   Wt Readings from Last 1  Encounters:  05/08/23 84.9 kg    Ideal Body Weight:  67.27 kg  BMI:  Body mass index is 29.31 kg/m.  Estimated Nutritional Needs:   Kcal:  2100-2350 kcals  Protein:  115-125g  Fluid:  >/= 2.1L  Loanne Emery P., RD, LDN, CNSC See AMiON for contact information

## 2023-05-08 NOTE — Progress Notes (Addendum)
 NAME:  Tyler [name], MRN:  [id number], DOB:  1950-03-09, LOS: 16 ADMISSION DATE:  04/22/2023, CONSULTATION/ SERVICE  DATE:  04/25/23 REFERRING MD:  Dr. Candise Che, CHIEF COMPLAINT: Neck swelling, drooling, difficulty swallowing, altered mental status with concern for partial seizures  History of Present Illness:  Tyler [name] is a 73 year old gentleman with significant history of CNS relapse of large B-cell lymphoma, partial seizure who was admitted to cardiology service on 2/25 for second cycle of high-dose methotrexate with leucovorin rescue.  Patient also has prior history of paroxysmal atrial fibrillation not on anticoagulation due to bleeding risk, prostate cancer status post prostatectomy in 2021, pulmonary embolism, hepatitis B exposure.  Patient had high-grade large B-cell lymphoma and completed 6 cycles of R-CHOP with systemic remission in the past.  As per the primary oncologist, patient was doing well and receiving his medication as planned.  He was started on bicarb drip to minimize the methotrexate related toxicity.  However, this evening, oncologist noted increased neck swelling as well as patient had difficulty swallowing.  He was also noted to have drooling of saliva and developed altered mental status with facial twitching concerning for seizure activity.  There was concern about patient's ability to protect airway and possible angioedema (neck swelling), ED was consulted and patient was intubated emergently.  At the time of my evaluation, patient was intubated and unresponsive.  No family member at the bedside.  Rest of the information is as per the chart review and I was unable to independently verify with the patient.   PAST    has a past medical history of Cancer (HCC), Follicular lymphoma (HCC) (2024), History of pulmonary embolism, Hypertension, Myelodysplastic syndrome (HCC), PAF (paroxysmal atrial fibrillation) (HCC), and Recurrent UTI.   has a past surgical history that includes  Robot assisted laparoscopic radical prostatectomy (N/A, 09/01/2019); Lymphadenectomy (Bilateral, 09/01/2019); Esophagogastroduodenoscopy (egd) with propofol (N/A, 08/21/2022); biopsy (08/21/2022); IR IMAGING GUIDED PORT INSERTION (08/26/2022); Craniotomy (Right, 03/10/2023); and Application of cranial navigation (Right, 03/10/2023).   Significant events   04/22/2023 - ADMIT  2/27  - MRI brain - Marked interval decrease in size of right parietal mass/lymphoma, now measuring 2.9 x 3.5 x 2.3 cm (previously 5.4 x 3.6 x 4.4 cm), consistent with interval response to therapy. Persistent but improved vasogenic edema within the adjacent posterior right cerebral hemisphere, with interval resolution of previously seen right-to-left midline shift. 2. 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 Correlation with symptomatology and EEG recommended. Evolving changes of subacute ischemia would be the primary differential consideration, but are felt to be less likely.  2/28 - Transferred to ICU and intubated. Witnessed by Dr Candise Che with have left neck swelling, gurgling, and twitching left side -> then partially respnsvive -> ativan/confused -> EDP intubated. Around 7.30-8pm  04/26/23: Coretrack placed early hours .Intubated early hours.  On the ventilator FiO2 50%.  Low-grade fever since 04/17/2023 but normal white count. Ceribell EEG oongoing. Prmary language for aptient is Swahili. SBP HIGH with HR 40s - on scheduled lopressor . Ceribel shows    ceribell EEG is is suggestive of severe diffuse encephalopathy. No seizures were seen throughout the recording.   3/2 Extubated. Remains on PANDA. Scheduled for barium today. Remains on cEEG per Neuro  3/3-3/7 Continues to have focal seizures refractory to increased AED use. Family meeting held with agreement to continue to escalate treatment and proceed with intubation if  needed.  3/8 Intubated  3/10 continues  to have breakthrough seizures,   SUBJECTIVE/OVERNIGHT/INTERVAL HX   Continues to spike fevers  Objective   Blood pressure 116/76, pulse 79, temperature 99.4 F (37.4 C), temperature source Axillary, resp. rate (!) 25, height 5' 7.01" (1.702 m), weight 84.9 kg, SpO2 97%.    Vent Mode: PRVC FiO2 (%):  [30 %] 30 % Set Rate:  [18 bmp] 18 bmp Vt Set:  [530 mL] 530 mL PEEP:  [5 cmH20] 5 cmH20 Plateau Pressure:  [17 cmH20-18 cmH20] 18 cmH20   Intake/Output Summary (Last 24 hours) at 05/08/2023 1133 Last data filed at 05/08/2023 1100 Gross per 24 hour  Intake 2755.09 ml  Output 1725 ml  Net 1030.09 ml   Filed Weights   05/06/23 0500 05/07/23 0500 05/08/23 0424  Weight: 83.7 kg 83.9 kg 84.9 kg   Physical Exam: Blood pressure 116/76, pulse 79, temperature 99.4 F (37.4 C), temperature source Axillary, resp. rate (!) 25, height 5' 7.01" (1.702 m), weight 84.9 kg, SpO2 97%. Gen:      No acute distress HEENT:  EOMI, sclera anicteric Neck:     No masses; no thyromegaly, ETT Lungs:    Clear to auscultation bilaterally; normal respiratory effort CV:         Regular rate and rhythm; no murmurs Abd:      + bowel sounds; soft, non-tender; no palpable masses, no distension Ext:    No edema; adequate peripheral perfusion Skin:      Warm and dry; no rash Neuro: Sedated  Lab/imaging reviewed Significant for sodium 130, BUN/creatinine 25/0.93 WBC 3.9, hemoglobin 11.4, platelets 214 Tracheal aspirate with abundant Pseudomonas Chest x-ray with minimal basal atelectasis  Assessment & Plan:   Partial sensory seizures, left side - ongoing seizures Acute encephalopathy  -on 04/25/2203 felt due to CNS lymphoma with edema -04/25/2023: Morning reports of relatively normal mental status but at night sudden worsening with obtundation and potential seizures and intubated. MRI Mass improvd but 2/27 - New diffusion signal abnormality involving the right  insular cortex and overlying right frontal lobe, favored to reflect sequelae of seizure -3/8 Intubated for decreased mental status in setting of seizures and increased AED use P:  AED management per Neuro.  On phenobarb, perampanel, Vimpat, Keppra, Neurontin Continuous EEG Reviewed MRI results with neurology with for MCA stroke.  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 S/p chemotherapy R-CHOP.  S/p craniotomy with gross total resection 03/10/23.  - CT of head 04/07/2023 showed interval increase in size of tumor at the craniotomy site. MRI of head 04/07/2023 increase in size of tumor with slightly increased surrounding edema. MRI brain 2/27  - improvmeent - Currently on Hi-dose methotrexate chemotherapy regimen which is more aggressive approach.  Received cycle 1 on 04/09/23 and admitted for cycle 2 of HIGH DOSE Methotrexate chemotherapy regimen,  ompleted 04/23/2023. P: Oncology following  --PO dexamethasone for brain edema - IV Rituxan as outpatient between 2nd and 3rd cycle of methotrexate and subsequently between treatment cycles. Plan for first week of March 2025 if discharged - can be delayed if needed> May start methotrexate while inpatient Continue multivitamins, folic acid  Acute respiratory failure 04/25/2023 evening associated with neck swelling   - Initially had neck swelling at time but CT neck is normal and exam 3/1 - is fat tissue. Able to extubate on 3/2. P:   Extubated 3/2. Reintubated 3/8 for increased AED utilization Continue full vent support for  now. Wean as tolerated With antibiotics for fevers and new HAP Pseudomonas noted in tracheal aspirate.  Follow final sensitivities Change cefepime to Zosyn in case Pseudomonas has any resistance.  Hypertensive and Bradycardic on lopressor 04/26/23 Normal echo 04/26/23 P:  DC lopressor due to bradycardia 3/2 Continue  hydralazine Telemetry monitoring  Hep B antibody positive, chronic P: On tenofovir for hep B reactivation suppression in context of Rituxan use. Plan is to continue for 6 months after his last chemotherapy. Follow ID outpatient  Fevers P: Antibiotics as above for treatment of HAP No DVT in LE Check blood cultures, urine cultures If fevers persist then consider LP  Mild anemia of critical illness onset less than 13 g% 04/26/2023 P:  Trend Transfuse if needed for Hg goal >7  Mild thrombocytopenia - mild P: Lovenox 40 mg every 24 hours    At risk for hypo and hyperglycemia P:   SSI Tube feeds   Best practice:  Diet: Tube feeds Pain/Anxiety/Delirium protocol (if indicated): Yes VAP protocol (if indicated): Yes DVT prophylaxis: lovnox GI prophylaxis: Home PPI Glucose control: ssi Mobility: bed rest Code Status: full Family Communication:   ATTESTATION & SIGNATURE   The patient is critically ill with multiple organ system failure and requires high complexity decision making for assessment and support, frequent evaluation and titration of therapies, advanced monitoring, review of radiographic studies and interpretation of complex data.   Critical Care Time devoted to patient care services, exclusive of separately billable procedures, described in this note is 35 minutes.   Chilton Greathouse MD [facility] See Amion for pager  If no response to pager , please call [phone] until 7pm After 7:00 pm call Elink  [phone] 05/08/2023, 11:33 AM

## 2023-05-08 NOTE — Plan of Care (Signed)
  Problem: Education: Goal: Knowledge of General Education information will improve Description: Including pain rating scale, medication(s)/side effects and non-pharmacologic comfort measures Outcome: Not Progressing   Problem: Health Behavior/Discharge Planning: Goal: Ability to manage health-related needs will improve Outcome: Not Progressing   Problem: Clinical Measurements: Goal: Ability to maintain clinical measurements within normal limits will improve Outcome: Not Progressing Goal: Will remain free from infection Outcome: Not Progressing Goal: Diagnostic test results will improve Outcome: Not Progressing Goal: Respiratory complications will improve Outcome: Not Progressing Goal: Cardiovascular complication will be avoided Outcome: Not Progressing   Problem: Activity: Goal: Risk for activity intolerance will decrease Outcome: Not Progressing   Problem: Nutrition: Goal: Adequate nutrition will be maintained Outcome: Not Progressing   Problem: Coping: Goal: Level of anxiety will decrease Outcome: Not Progressing   Problem: Elimination: Goal: Will not experience complications related to bowel motility Outcome: Not Progressing Goal: Will not experience complications related to urinary retention Outcome: Not Progressing   Problem: Pain Managment: Goal: General experience of comfort will improve and/or be controlled Outcome: Not Progressing   Problem: Safety: Goal: Ability to remain free from injury will improve Outcome: Not Progressing   Problem: Skin Integrity: Goal: Risk for impaired skin integrity will decrease Outcome: Not Progressing   Problem: Education: Goal: Knowledge of the prescribed therapeutic regimen will improve Outcome: Not Progressing   Problem: Activity: Goal: Ability to implement measures to reduce episodes of fatigue will improve Outcome: Not Progressing   Problem: Bowel/Gastric: Goal: Will not experience complications related to bowel  motility Outcome: Not Progressing   Problem: Coping: Goal: Ability to identify and develop effective coping behavior will improve Outcome: Not Progressing   Problem: Nutritional: Goal: Maintenance of adequate nutrition will improve Outcome: Not Progressing   Problem: Safety: Goal: Non-violent Restraint(s) Outcome: Completed/Met   Problem: Education: Goal: Ability to describe self-care measures that may prevent or decrease complications (Diabetes Survival Skills Education) will improve Outcome: Not Progressing Goal: Individualized Educational Video(s) Outcome: Not Progressing   Problem: Coping: Goal: Ability to adjust to condition or change in health will improve Outcome: Not Progressing   Problem: Fluid Volume: Goal: Ability to maintain a balanced intake and output will improve Outcome: Not Progressing   Problem: Health Behavior/Discharge Planning: Goal: Ability to identify and utilize available resources and services will improve Outcome: Not Progressing Goal: Ability to manage health-related needs will improve Outcome: Not Progressing   Problem: Metabolic: Goal: Ability to maintain appropriate glucose levels will improve Outcome: Not Progressing   Problem: Nutritional: Goal: Maintenance of adequate nutrition will improve Outcome: Not Progressing Goal: Progress toward achieving an optimal weight will improve Outcome: Not Progressing   Problem: Skin Integrity: Goal: Risk for impaired skin integrity will decrease Outcome: Not Progressing   Problem: Tissue Perfusion: Goal: Adequacy of tissue perfusion will improve Outcome: Not Progressing   Problem: Activity: Goal: Ability to tolerate increased activity will improve Outcome: Not Progressing   Problem: Respiratory: Goal: Ability to maintain a clear airway and adequate ventilation will improve Outcome: Not Progressing   Problem: Role Relationship: Goal: Method of communication will improve Outcome: Not  Progressing

## 2023-05-08 NOTE — Progress Notes (Signed)
 OT Cancellation Note  Patient Details Name: Hewitt Garner MRN: [id number] DOB: [birth year]   Cancelled Treatment:    Reason Eval/Treat Not Completed: Patient not medically ready.  Pt still ventilated at this time and headed down for follow-up CT scan.  Will check back as able to proceed with therapy as pt is appropriate.  Tessla Spurling OTR/L 05/08/2023, 12:25 PM

## 2023-05-08 NOTE — Procedures (Addendum)
 Patient Name: Tyler [name]  MRN: [id number]  Epilepsy Attending: Charlsie Quest  Referring Physician/Provider: Gordy Councilman, MD  Duration: 05/07/2023 1608 to 05/08/2023 1608   Patient history: 73yo M patient with 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 EEG to evaluate for seizure    Level of alertness: lethargic   AEDs during EEG study: LEV, LCM, GBP   Technical aspects: This EEG study was done with scalp electrodes positioned according to the 10-20 International system of electrode placement. Electrical activity was reviewed with band pass filter of 1-70Hz , sensitivity of 7 uV/mm, display speed of 13mm/sec with a 60Hz  notched filter applied as appropriate. EEG data were recorded continuously and digitally stored.  Video monitoring was available and reviewed as appropriate.   Description: No clear posterior dominant rhythm was seen.  Sleep was characterized by sleep spindles (12 to 14 Hz), maximal frontocentral region.  EEG showed continuous generalized and maximal right parietal 3 to 6 Hz theta-delta slowing. Lateralized periodic discharges were noted in right hemisphere, maximal right parietal region with fluctuating frequency of 0.25-1Hz . Hyperventilation and photic stimulation were not performed.      ABNORMALITY -Lateralized periodic discharges, right hemisphere, maximal right parietal  region ( LPD) -Continuous slow, generalized and maximal right parietal region   IMPRESSION: This study showed evidence of epileptogenicity arising from right hemisphere, maximal right parietal region. Additionally there is cortical dysfunction in right hemisphere, maximal right parietal region likely secondary to underlying structural abnormality. Lastly there is moderate diffuse encephalopathy. No definite seizures were noted.   Tyler [name] [name]

## 2023-05-08 NOTE — Progress Notes (Signed)
 Pt transport on vent from 4N18 to CT without any complications. RN at bedside, RT will monitor as needed.

## 2023-05-08 NOTE — Progress Notes (Signed)
 Subjective: No significant clinical changes  ROS: negative except above  Examination  Vital signs in last 24 hours: Temp:  [99.3 F (37.4 C)-102.7 F (39.3 C)] 99.4 F (37.4 C) (03/13 0801) Pulse Rate:  [65-91] 79 (03/13 1100) Resp:  [17-35] 25 (03/13 1100) BP: (82-154)/(63-106) 116/76 (03/13 1100) SpO2:  [92 %-100 %] 97 % (03/13 1100) FiO2 (%):  [30 %] 30 % (03/13 1059) Weight:  [84.9 kg] 84.9 kg (03/13 0424)  General: lying in bed, NAD Neuro: With repeated noxious stimulation, he will wake up, engage with the examiner, he appears to follow command to wiggle toes, but then simply waves his hand indicating for me to go away.  He appears to move his right side better than his left, with minimal flexion to noxious stimulation in the right leg, no movement in the left leg, purposeful voluntary movement in the right arm, flexion to noxious stimulation in the left arm   Basic Metabolic Panel: Recent Labs  Lab 05/02/23 0558 05/03/23 0500 05/03/23 1118 05/04/23 1133 05/05/23 0500 05/06/23 0549 05/07/23 0541 05/08/23 0440  NA 135 133*   < > 135 135 134* 133* 130*  K 3.9 4.0   < > 3.9 3.9 3.9 4.4 4.4  CL 105 99  --  101 102 101 98 99  CO2 26 25  --  26 24 23 25 25   GLUCOSE 131* 127*  --  125* 118* 149* 119* 185*  BUN 29* 27*  --  27* 26* 25* 24* 25*  CREATININE 0.89 0.86  --  0.79 0.70 0.76 0.83 0.93  CALCIUM 7.5* 8.0*  --  8.1* 7.9* 8.1* 8.4* 8.1*  MG 1.9 1.9  --   --   --   --   --   --    < > = values in this interval not displayed.    CBC: Recent Labs  Lab 05/04/23 1133 05/05/23 0500 05/06/23 0549 05/07/23 0541 05/08/23 0440  WBC 3.3* 3.1* 2.6* 3.7* 3.9*  HGB 12.3* 11.5* 10.9* 12.0* 11.4*  HCT 37.5* 35.0* 32.6* 35.6* 34.0*  MCV 90.6 91.6 89.8 90.4 88.8  PLT 151 153 162 194 214     Coagulation Studies: No results for input(s): "LABPROT", "INR" in the last 72 hours.  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  His seizures are improved, but his repeat MRI from the 11th demonstrates a different pattern of restricted diffusion and edema that I am highly concerned represents MCA branch infarction.  The distribution does involve the anterior temporal lobe as well which would be unusual, as well as thalamic involvement so I think we are dealing with multiple etiologies for the current appearance.  The isolation of the cytotoxic edema to an MCA branch distribution, however, it is highly suggestive that he has had a stroke in addition to his seizures.   Epilepsy with breakthrough seizure Cerebral edema B-cell lymphoma -In the setting of underlying lymphoma and edema   Recommendations -Continue Vimpat  200 mg twice daily -Continue Keppra 2000 mg twice daily -Continue gabapentin 100 mg 3 times daily -Continue video EEG to monitor for intermittent seizures -CT angiogram head and neck  This patient is critically ill and at significant risk of neurological worsening, death and care requires constant monitoring of vital signs, hemodynamics,respiratory and cardiac monitoring, neurological assessment, discussion with family, other specialists and medical decision making of high complexity. I spent 35 minutes of neurocritical care time  in the care of  this patient. This was time spent independent of any time provided by nurse practitioner or PA.  Ritta Slot, MD [facility]   If 7pm- 7am, please page neurology on call as listed in AMION. 05/08/2023  11:44 AM

## 2023-05-09 ENCOUNTER — Inpatient Hospital Stay (HOSPITAL_COMMUNITY)

## 2023-05-09 DIAGNOSIS — R569 Unspecified convulsions: Secondary | ICD-10-CM | POA: Diagnosis not present

## 2023-05-09 LAB — COMPREHENSIVE METABOLIC PANEL
ALT: 43 U/L (ref 0–44)
AST: 29 U/L (ref 15–41)
Albumin: 2.1 g/dL — ABNORMAL LOW (ref 3.5–5.0)
Alkaline Phosphatase: 56 U/L (ref 38–126)
Anion gap: 8 (ref 5–15)
BUN: 28 mg/dL — ABNORMAL HIGH (ref 8–23)
CO2: 24 mmol/L (ref 22–32)
Calcium: 8 mg/dL — ABNORMAL LOW (ref 8.9–10.3)
Chloride: 98 mmol/L (ref 98–111)
Creatinine, Ser: 0.87 mg/dL (ref 0.61–1.24)
GFR, Estimated: >60 mL/min (ref >60)
Glucose, Bld: 172 mg/dL — ABNORMAL HIGH (ref 70–99)
Potassium: 4.2 mmol/L (ref 3.5–5.1)
Sodium: 130 mmol/L — ABNORMAL LOW (ref 135–145)
Total Bilirubin: 0.4 mg/dL (ref 0.0–1.2)
Total Protein: 5.2 g/dL — ABNORMAL LOW (ref 6.5–8.1)

## 2023-05-09 LAB — CBC
HCT: 32.7 % — ABNORMAL LOW (ref 39.0–52.0)
Hemoglobin: 10.9 g/dL — ABNORMAL LOW (ref 13.0–17.0)
MCH: 29.9 pg (ref 26.0–34.0)
MCHC: 33.3 g/dL (ref 30.0–36.0)
MCV: 89.8 fL (ref 80.0–100.0)
Platelets: 227 10*3/uL (ref 150–400)
RBC: 3.64 MIL/uL — ABNORMAL LOW (ref 4.22–5.81)
RDW: 13 % (ref 11.5–15.5)
WBC: 4.8 10*3/uL (ref 4.0–10.5)
nRBC: 0 % (ref 0.0–0.2)

## 2023-05-09 LAB — GLUCOSE, CAPILLARY
Glucose-Capillary: 194 mg/dL — ABNORMAL HIGH (ref 70–99)
Glucose-Capillary: 133 mg/dL — ABNORMAL HIGH (ref 70–99)
Glucose-Capillary: 79 mg/dL (ref 70–99)
Glucose-Capillary: 115 mg/dL — ABNORMAL HIGH (ref 70–99)
Glucose-Capillary: 114 mg/dL — ABNORMAL HIGH (ref 70–99)
Glucose-Capillary: 97 mg/dL (ref 70–99)

## 2023-05-09 MED ORDER — SCOPOLAMINE 1 MG/3DAYS TD PT72
1.0000 | MEDICATED_PATCH | TRANSDERMAL | Status: DC
  Administered 2023-05-09 – 2023-05-18 (×4): 1.5 mg via TRANSDERMAL
  Filled 2023-05-09 (×4): qty 1

## 2023-05-09 NOTE — Progress Notes (Signed)
 vLTM maintenance  All impedances below 10kohms.  No skin  breakdown noted at FP1  FP2  F3 Fz F4

## 2023-05-09 NOTE — Progress Notes (Signed)
 Cortrak Tube Team Note:  Received verbal consult to replace bridle due to being too tight.  Bridle replaced, Cortrak tube remains at same marking of 75cm.     Shelle Iron RD, LDN Contact via [employer].

## 2023-05-09 NOTE — TOC CM/SW Note (Signed)
 Transition of Care [facility]) - Inpatient Brief Assessment   Patient Details  Name: Tyler [name] MRN: [id number] Date of Birth: 1950/05/28  Transition of Care [facility] CM/SW Contact:    Mearl Latin, LCSW Phone Number: 05/09/2023, 6:23 PM   Clinical Narrative: CSW continuing to follow.   Transition of Care Asessment: Insurance and Status: Insurance coverage has been reviewed Patient has primary care physician: Yes Home environment has been reviewed: [address]  [place] [phone] Prior level of function:: independent Prior/Current Home Services: No current home services Social Drivers of Health Review: SDOH reviewed no interventions necessary Readmission risk has been reviewed: Yes Transition of care needs: no transition of care needs at this time

## 2023-05-09 NOTE — Procedures (Signed)
 Extubation Procedure Note  Patient Details:   Name: Tyler [name] DOB: 08-Jun-1950 MRN: [id number]   Airway Documentation:    Vent end date: 05/09/23 Vent end time: 1002   Evaluation  O2 sats: stable throughout Complications: No apparent complications Patient did tolerate procedure well. Bilateral Breath Sounds: Rhonchi   Yes  Patient was extubated to a 4L [place] without any complications, dyspnea or stridor noted. Positive cuff leak prior to extubation.   Aquilla Voiles, Margaretmary Dys 05/09/2023, 10:16 AM

## 2023-05-09 NOTE — Procedures (Addendum)
 Patient Name: Tyler [name]  MRN: [id number]  Epilepsy Attending: Charlsie Quest  Referring Physician/Provider: Gordy Councilman, MD  Duration: 05/08/2023 1608 to 05/09/2023 1608   Patient history: 73yo M patient with 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 EEG to evaluate for seizure    Level of alertness: lethargic   AEDs during EEG study: LEV, LCM, GBP   Technical aspects: This EEG study was done with scalp electrodes positioned according to the 10-20 International system of electrode placement. Electrical activity was reviewed with band pass filter of 1-70Hz , sensitivity of 7 uV/mm, display speed of 77mm/sec with a 60Hz  notched filter applied as appropriate. EEG data were recorded continuously and digitally stored.  Video monitoring was available and reviewed as appropriate.   Description: No clear posterior dominant rhythm was seen. Sleep was characterized by sleep spindles (12 to 14 Hz), maximal frontocentral region.  EEG showed continuous generalized and maximal right parietal 3 to 6 Hz theta-delta slowing. Lateralized periodic discharges were noted in right hemisphere, maximal right parietal region with fluctuating frequency of 0.25-1Hz . Hyperventilation and photic stimulation were not performed.      ABNORMALITY -Lateralized periodic discharges, right hemisphere, maximal right parietal  region ( LPD) -Continuous slow, generalized and maximal right parietal region   IMPRESSION: This study showed evidence of epileptogenicity arising from right hemisphere, maximal right parietal region. Additionally there is cortical dysfunction in right hemisphere, maximal right parietal region likely secondary to underlying structural abnormality. Lastly there is moderate diffuse encephalopathy. No  seizures were noted.   Naksh Radi [name]

## 2023-05-09 NOTE — TOC CM/SW Note (Signed)
 Transition of Care [facility]) - Inpatient Brief Assessment   Patient Details  Name: Tyler [name] MRN: [id number] Date of Birth: 1951/01/03  Transition of Care [facility]) CM/SW Contact:    Mearl Latin, LCSW Phone Number: 05/09/2023, 6:24 PM   Clinical Narrative: CSW continuing to follow.   Transition of Care Asessment: Insurance and Status: Insurance coverage has been reviewed Patient has primary care physician: Yes Home environment has been reviewed: [address]  [place] [phone] Prior level of function:: independent Prior/Current Home Services: No current home services Social Drivers of Health Review: SDOH reviewed no interventions necessary Readmission risk has been reviewed: Yes Transition of care needs: no transition of care needs at this time

## 2023-05-09 NOTE — Progress Notes (Signed)
 When charting evening assessment, it was noted that last NIH documented was a 3 @ 0730, and current NIH is 15. When doing bedside neuro assessment w/ day team, the day shift RN stated that neuro status was unchanged and this was pt baseline. Derry Lory, MD notified of findings. MD does not believe repeat scan or additional interventions are needed at this time because pt is not having any new neurological deficits.

## 2023-05-09 NOTE — Progress Notes (Signed)
 NAME:  Tyler [name], MRN:  [id number], DOB:  [birth year], LOS: 17 ADMISSION DATE:  04/22/2023, CONSULTATION/ SERVICE  DATE:  04/25/23 REFERRING MD:  Dr. Candise Che, CHIEF COMPLAINT: Neck swelling, drooling, difficulty swallowing, altered mental status with concern for partial seizures  History of Present Illness:  Tyler [name] is a 72 year old gentleman with significant history of CNS relapse of large B-cell lymphoma, partial seizure who was admitted to oncology service on 2/25 for second cycle of high-dose methotrexate with leucovorin rescue.  Patient also has prior history of paroxysmal atrial fibrillation not on anticoagulation due to bleeding risk, prostate cancer status post prostatectomy in 2021, pulmonary embolism, hepatitis B exposure.  Patient had high-grade large B-cell lymphoma and completed 6 cycles of R-CHOP with systemic remission in the past.  As per the primary oncologist, patient was doing well and receiving his medication as planned.  He was started on bicarb drip to minimize the methotrexate related toxicity.  However, this evening, oncologist noted increased neck swelling as well as patient had difficulty swallowing.  He was also noted to have drooling of saliva and developed altered mental status with facial twitching concerning for seizure activity.  There was concern about patient's ability to protect airway and possible angioedema (neck swelling), ED was consulted and patient was intubated emergently.  PMH    has a past medical history of Cancer (HCC), Follicular lymphoma (HCC) (2024), History of pulmonary embolism, Hypertension, Myelodysplastic syndrome (HCC), PAF (paroxysmal atrial fibrillation) (HCC), and Recurrent UTI.   has a past surgical history that includes Robot assisted laparoscopic radical prostatectomy (N/A, 09/01/2019); Lymphadenectomy (Bilateral, 09/01/2019); Esophagogastroduodenoscopy (egd) with propofol (N/A, 08/21/2022); biopsy (08/21/2022); IR IMAGING GUIDED PORT  INSERTION (08/26/2022); Craniotomy (Right, 03/10/2023); and Application of cranial navigation (Right, 03/10/2023).   Significant events   04/22/2023 - ADMIT  2/27  - MRI brain - Marked interval decrease in size of right parietal mass/lymphoma, now measuring 2.9 x 3.5 x 2.3 cm (previously 5.4 x 3.6 x 4.4 cm), consistent with interval response to therapy. Persistent but improved vasogenic edema within the adjacent posterior right cerebral hemisphere, with interval resolution of previously seen right-to-left midline shift. 2. 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 Correlation with symptomatology and EEG recommended. Evolving changes of subacute ischemia would be the primary differential consideration, but are felt to be less likely.  2/28 - Transferred to ICU and intubated. Witnessed by Dr Candise Che with have left neck swelling, gurgling, and twitching left side -> then partially respnsvive -> ativan/confused -> EDP intubated. Around 7.30-8pm  04/26/23: Coretrack placed early hours .Intubated early hours.  On the ventilator FiO2 50%.  Low-grade fever since 04/17/2023 but normal white count. Ceribell EEG oongoing. Prmary language for aptient is Swahili. SBP HIGH with HR 40s - on scheduled lopressor . Ceribel shows    ceribell EEG is is suggestive of severe diffuse encephalopathy. No seizures were seen throughout the recording.   3/2 Extubated. Remains on PANDA. Scheduled for barium today. Remains on cEEG per Neuro  3/3-3/7 Continues to have focal seizures refractory to increased AED use. Family meeting held with agreement to continue to escalate treatment and proceed with intubation if needed.  3/8 Intubated  3/10 continues to have breakthrough seizures,   SUBJECTIVE/OVERNIGHT/INTERVAL HX   Continued breakthrough seizures. Latter felt to be of multiple etiologies. Also may have had recent MCA  stroke.   Objective   Blood pressure (!) 139/97, pulse 68, temperature 99.8 F (  37.7 C), temperature source Axillary, resp. rate (!) 23, height 5' 7.01" (1.702 m), weight 85.6 kg, SpO2 97%.    Vent Mode: PRVC FiO2 (%):  [30 %] 30 % Set Rate:  [18 bmp] 18 bmp Vt Set:  [530 mL] 530 mL PEEP:  [5 cmH20] 5 cmH20 Plateau Pressure:  [18 cmH20-20 cmH20] 18 cmH20   Intake/Output Summary (Last 24 hours) at 05/09/2023 0837 Last data filed at 05/09/2023 0800 Gross per 24 hour  Intake 2220.51 ml  Output 1600 ml  Net 620.51 ml   Filed Weights   05/07/23 0500 05/08/23 0424 05/09/23 0500  Weight: 83.9 kg 84.9 kg 85.6 kg   Physical Exam: Gen:      No acute distress, appears well-nourished.  HEENT:  ETT and SBFT in place.  Lungs:    Clear bilaterally, tolerating SBT.  CV:         HS normal  Abd:    Soft Ext:   No edema Skin:      Warm and dry; no rash Neuro:  On minimal sedation. Awake and slowly follows commands on right side, but not on the left.   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  Hypertensive and Bradycardic on lopressor 04/26/23 Normal echo 04/26/23 Hep B antibody positive, chronic Mild anemia of critical illness  Mild thrombocytopenia - mild  Plan:  - Awake today. Will stop remaining sedation and SBT with goal to extubate. - Swallow evaluation and mobilization thereafter.  - Continue current AED's - Ongoing lymphoma treatment per Oncology once acute issues have resolved.   Best practice:  Diet: Tube  feeds Pain/Anxiety/Delirium protocol (if indicated): Sedation off today.  VAP protocol (if indicated): Yes DVT prophylaxis: Lovenox GI prophylaxis: Home PPI Glucose control: SSI - adequate control Mobility: bed rest Code Status: full Family Communication: pending  CRITICAL CARE Performed by: Lynnell Catalan   Total critical care time: 40 minutes  Critical care time was exclusive of separately billable procedures and treating other patients.  Critical care was necessary to treat or prevent imminent or life-threatening deterioration.  Critical care was time spent personally by me on the following activities: development of treatment plan with patient and/or surrogate as well as nursing, discussions with consultants, evaluation of patient's response to treatment, examination of patient, obtaining history from patient or surrogate, ordering and performing treatments and interventions, ordering and review of laboratory studies, ordering and review of radiographic studies, pulse oximetry, re-evaluation of patient's condition and participation in multidisciplinary rounds.  Lynnell Catalan, MD [facility] ICU Physician [facility] [place] [facility]  Pager: [phone] Mobile: [phone] After hours: [phone].

## 2023-05-09 NOTE — Progress Notes (Signed)
 Occupational Therapy Discharge Patient Details Name: Ronte Parker MRN: [id number] DOB: Nov 05, 1950 Today's Date: 05/09/2023 Time:  -     Patient discharged from OT services secondary to patient has refused 3 (three) consecutive times without medical reason.  Please see latest therapy progress note for current level of functioning and progress toward goals.    Progress and discharge plan discussed with patient and/or caregiver: Patient unable to participate in discharge planning and no caregivers available  GO     Mateo Flow 05/09/2023, 8:45 AM

## 2023-05-09 NOTE — Plan of Care (Signed)
  Problem: Clinical Measurements: Goal: Ability to maintain clinical measurements within normal limits will improve Outcome: Progressing Goal: Cardiovascular complication will be avoided Outcome: Progressing   Problem: Nutrition: Goal: Adequate nutrition will be maintained Outcome: Progressing   Problem: Coping: Goal: Level of anxiety will decrease Outcome: Progressing   Problem: Elimination: Goal: Will not experience complications related to bowel motility Outcome: Progressing Goal: Will not experience complications related to urinary retention Outcome: Progressing

## 2023-05-10 ENCOUNTER — Inpatient Hospital Stay (HOSPITAL_COMMUNITY)

## 2023-05-10 DIAGNOSIS — G936 Cerebral edema: Secondary | ICD-10-CM | POA: Diagnosis not present

## 2023-05-10 DIAGNOSIS — C8510 Unspecified B-cell lymphoma, unspecified site: Secondary | ICD-10-CM | POA: Diagnosis not present

## 2023-05-10 DIAGNOSIS — R569 Unspecified convulsions: Secondary | ICD-10-CM | POA: Diagnosis not present

## 2023-05-10 LAB — GLUCOSE, CAPILLARY
Glucose-Capillary: 107 mg/dL — ABNORMAL HIGH (ref 70–99)
Glucose-Capillary: 140 mg/dL — ABNORMAL HIGH (ref 70–99)
Glucose-Capillary: 87 mg/dL (ref 70–99)
Glucose-Capillary: 93 mg/dL (ref 70–99)
Glucose-Capillary: 136 mg/dL — ABNORMAL HIGH (ref 70–99)
Glucose-Capillary: 103 mg/dL — ABNORMAL HIGH (ref 70–99)

## 2023-05-10 MED ORDER — ORAL CARE MOUTH RINSE
15.0000 mL | OROMUCOSAL | Status: DC
  Administered 2023-05-10 – 2023-05-12 (×11): 15 mL via OROMUCOSAL

## 2023-05-10 NOTE — Progress Notes (Signed)
 Oncology Short Note  Progress notes reviewed. Will followup on Monday and based on neurologic recovery and seizure control status will consider proceeding with C3 Of HD methotrexate with leucovorin rescue. Appreciate excellent carfe from neurology and PCCM service.  Wyvonnia Lora MD

## 2023-05-10 NOTE — Evaluation (Signed)
 Physical Therapy Re-Evaluation Patient Details Name: Tyler [name] MRN: [id number] DOB: [birth year] Today's Date: 05/10/2023  History of Present Illness  73 yo male presents on 2/25 for second cycle of high dose methotrexate with leucovorin rescue. 2/27 MRI showed marked interval decrease in size of right parietal mass/lymphoma and new diffusion signal abnormality involving the right insular cortex and overlying right frontal lobe, favored to reflect sequelae of seizure. 2/28 transferred to ICU and intubated. 3/2 extubated. 3/8 with seizures and need for airway protection so re-intubated and continued to have breakthrough seizures; extubated 3/14. EEG with evidence of epileptogenicity arising from R hemisphere, maximal R parietal region and cortical dysfunction in R hemisphere. PMH: CNS relapse of large B-cell lymphoma, partial seizure, paroxysmal atrial fib not on anticoagulation due to bleeding risk, prostate cancer status post prostatectomy 2021, pulmonary embolism.   Clinical Impression  The pt was seen for re-evaluation following recent extubation on 3/14. Pt alert and attending to therapist upon arrival, attempting to verbalize but with mumbled speech that is difficult to understand. Pt presents with deficits in L-sided strength, coordination, awareness, and balance. He needed max-totalA to complete bed mobility and sit-stand attempts due to poor functional strength and balance, and needs significant blocking of L knee as well as assist with positioning of L foot for standing. The pt was able to follow intermittent commands, but with delay. Recommend intensive therapies >3hours/day once stable for d/c to allow for maximal functional recovery and safety with mobility.         If plan is discharge home, recommend the following: A lot of help with walking and/or transfers;A lot of help with bathing/dressing/bathroom   Can travel by private vehicle   No    Equipment Recommendations Other (comment)  (TBD after gait training initiated)  Recommendations for Other Services  Rehab consult    Functional Status Assessment Patient has had a recent decline in their functional status and demonstrates the ability to make significant improvements in function in a reasonable and predictable amount of time.     Precautions / Restrictions Precautions Precautions: Fall;Other (comment) Recall of Precautions/Restrictions: Impaired Precaution/Restrictions Comments: fecal pouch, cortrack Restrictions Weight Bearing Restrictions Per Provider Order: No      Mobility  Bed Mobility Overal bed mobility: Needs Assistance Bed Mobility: Supine to Sit, Sit to Supine     Supine to sit: Total assist, +2 for physical assistance, +2 for safety/equipment Sit to supine: Total assist, +2 for physical assistance, +2 for safety/equipment   General bed mobility comments: cues and assist, pt not assisting to complete movements    Transfers Overall transfer level: Needs assistance Equipment used: 2 person hand held assist Transfers: Sit to/from Stand Sit to Stand: Total assist, +2 physical assistance, +2 safety/equipment           General transfer comment: fair initiation on the R but ultimatley total A with L knee block as pt unable to do more than 25%    Ambulation/Gait               General Gait Details: pt unable at this time     Balance Overall balance assessment: Needs assistance Sitting-balance support: Feet supported, No upper extremity supported Sitting balance-Leahy Scale: Poor Sitting balance - Comments: CGA-mod A; frequent reaching to bed rail or therapist with RUE for support   Standing balance support: Bilateral upper extremity supported Standing balance-Leahy Scale: Zero Standing balance comment: dependent on external support  Pertinent Vitals/Pain Pain Assessment Pain Assessment: No/denies pain Pain Intervention(s): Monitored  during session    Home Living Family/patient expects to be discharged to:: Private residence Living Arrangements: Spouse/significant other Available Help at Discharge: Family Type of Home: Apartment Home Access: Level entry Entrance Stairs-Rails: Right;Left Entrance Stairs-Number of Steps: none   Home Layout: One level Home Equipment: Agricultural consultant (2 wheels);BSC/3in1;Shower seat Additional Comments: from chart, pt unable to relate and no family present today    Prior Function Prior Level of Function : Independent/Modified Independent             Mobility Comments: occasional RW use ADLs Comments: Independent to Mod I with occasional assistance from children     Extremity/Trunk Assessment   Upper Extremity Assessment Upper Extremity Assessment: Defer to OT evaluation LUE Deficits / Details: weak, poor command following on L. pt able to move in gravity eliminated plain but not through full range through PROM functional. Pt not using functionally    Lower Extremity Assessment Lower Extremity Assessment: Difficult to assess due to impaired cognition;RLE deficits/detail;LLE deficits/detail (unable to assess due to poor command following, able to move RLE against gravity and demos good ROM, limited activation of LLE, unable to move against gravity, hyperextends when standing.) RLE Deficits / Details: able to move RLE against gravity and demos good ROM LLE Deficits / Details: limited activation of LLE, unable to move against gravity, hyperextends when standing LLE Coordination: decreased fine motor;decreased gross motor    Cervical / Trunk Assessment Cervical / Trunk Assessment: Normal Cervical / Trunk Exceptions: prior L4 compression fracture  Communication   Communication Communication: Impaired Factors Affecting Communication: Reduced clarity of speech;Difficulty expressing self    Cognition Arousal: Alert Behavior During Therapy: Flat affect, Impulsive   PT -  Cognitive impairments: Difficult to assess Difficult to assess due to: Impaired communication                     PT - Cognition Comments: pt with mumbled speech, difficult to understand. at times seems to be nodding yes/no accurately but then is not consistent (for example not nodding yes to "is your name Dow?") demos some initiation with sit-stand but not consistent with initiating movements or clear with gestures Following commands: Impaired Following commands impaired: Follows one step commands inconsistently, Follows one step commands with increased time     Cueing Cueing Techniques: Verbal cues, Gestural cues, Tactile cues, Visual cues     General Comments General comments (skin integrity, edema, etc.): HR to 130s with exertion        Assessment/Plan    PT Assessment Patient needs continued PT services  PT Problem List Decreased strength;Decreased activity tolerance;Decreased mobility;Decreased balance;Decreased cognition;Decreased knowledge of use of DME;Decreased safety awareness;Decreased coordination;Impaired sensation       PT Treatment Interventions DME instruction;Gait training;Therapeutic exercise;Therapeutic activities;Functional mobility training;Balance training;Neuromuscular re-education;Cognitive remediation;Patient/family education    PT Goals (Current goals can be found in the Care Plan section)  Acute Rehab PT Goals Patient Stated Goal: none stated PT Goal Formulation: With patient Time For Goal Achievement: 05/24/23 Potential to Achieve Goals: Fair    Frequency Min 3X/week     Co-evaluation PT/OT/SLP Co-Evaluation/Treatment: Yes Reason for Co-Treatment: For patient/therapist safety;To address functional/ADL transfers PT goals addressed during session: Mobility/safety with mobility;Balance;Proper use of DME OT goals addressed during session: ADL's and self-care       AM-PAC PT "6 Clicks" Mobility  Outcome Measure Help needed turning from  your back to your side while  in a flat bed without using bedrails?: Total Help needed moving from lying on your back to sitting on the side of a flat bed without using bedrails?: Total Help needed moving to and from a bed to a chair (including a wheelchair)?: Total Help needed standing up from a chair using your arms (e.g., wheelchair or bedside chair)?: Total Help needed to walk in hospital room?: Total Help needed climbing 3-5 steps with a railing? : Total 6 Click Score: 6    End of Session Equipment Utilized During Treatment: Gait belt Activity Tolerance: Patient tolerated treatment well Patient left: in bed;with call bell/phone within reach;with bed alarm set Nurse Communication: Mobility status PT Visit Diagnosis: Unsteadiness on feet (R26.81);Other abnormalities of gait and mobility (R26.89);Other symptoms and signs involving the nervous system (R29.898)    Time: 1624-1700 PT Time Calculation (min) (ACUTE ONLY): 36 min   Charges:   PT Evaluation $PT Re-evaluation: 1 Re-eval   PT General Charges $$ ACUTE PT VISIT: 1 Visit         Vickki Muff, PT, DPT   Acute Rehabilitation Department Office [phone] Secure Chat Communication Preferred  Ronnie Derby 05/10/2023, 5:56 PM

## 2023-05-10 NOTE — Progress Notes (Signed)
 NAME:  Tyler [name], MRN:  [id number], DOB:  Feb 11, 1951, LOS: 18 ADMISSION DATE:  04/22/2023, CONSULTATION/ SERVICE  DATE:  04/25/23 REFERRING MD:  Tyler. Candise [name], CHIEF COMPLAINT: Neck swelling, drooling, difficulty swallowing, altered mental status with concern for partial seizures  History of Present Illness:  Tyler [name] is a 73 year old gentleman with significant history of CNS relapse of large B-cell lymphoma, partial seizure who was admitted to oncology service on 2/25 for second cycle of high-dose methotrexate with leucovorin rescue.  Patient also has prior history of paroxysmal atrial fibrillation not on anticoagulation due to bleeding risk, prostate cancer status post prostatectomy in 2021, pulmonary embolism, hepatitis B exposure.  Patient had high-grade large B-cell lymphoma and completed 6 cycles of R-CHOP with systemic remission in the past.  As per the primary oncologist, patient was doing well and receiving his medication as planned.  He was started on bicarb drip to minimize the methotrexate related toxicity.  However, this evening, oncologist noted increased neck swelling as well as patient had difficulty swallowing.  He was also noted to have drooling of saliva and developed altered mental status with facial twitching concerning for seizure activity.  There was concern about patient's ability to protect airway and possible angioedema (neck swelling), ED was consulted and patient was intubated emergently.  PMH    has a past medical history of Cancer (HCC), Follicular lymphoma (HCC) (2024), History of pulmonary embolism, Hypertension, Myelodysplastic syndrome (HCC), PAF (paroxysmal atrial fibrillation) (HCC), and Recurrent UTI.   has a past surgical history that includes Robot assisted laparoscopic radical prostatectomy (N/A, 09/01/2019); Lymphadenectomy (Bilateral, 09/01/2019); Esophagogastroduodenoscopy (egd) with propofol (N/A, 08/21/2022); biopsy (08/21/2022); IR IMAGING GUIDED PORT  INSERTION (08/26/2022); Craniotomy (Right, 03/10/2023); and Application of cranial navigation (Right, 03/10/2023).   Significant events   04/22/2023 - ADMIT  2/27  - MRI brain - Marked interval decrease in size of right parietal mass/lymphoma, now measuring 2.9 x 3.5 x 2.3 cm (previously 5.4 x 3.6 x 4.4 cm), consistent with interval response to therapy. Persistent but improved vasogenic edema within the adjacent posterior right cerebral hemisphere, with interval resolution of previously seen right-to-left midline shift. 2. 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 Correlation with symptomatology and EEG recommended. Evolving changes of subacute ischemia would be the primary differential consideration, but are felt to be less likely.  2/28 - Transferred to ICU and intubated. Witnessed by Tyler [name] with have left neck swelling, gurgling, and twitching left side -> then partially respnsvive -> ativan/confused -> EDP intubated. Around 7.30-8pm  04/26/23: Coretrack placed early hours .Intubated early hours.  On the ventilator FiO2 50%.  Low-grade fever since 04/17/2023 but normal white count. Ceribell EEG oongoing. Prmary language for aptient is Swahili. SBP HIGH with HR 40s - on scheduled lopressor . Ceribel shows    ceribell EEG is is suggestive of severe diffuse encephalopathy. No seizures were seen throughout the recording.   3/2 Extubated. Remains on PANDA. Scheduled for barium today. Remains on cEEG per Neuro  3/3-3/7 Continues to have focal seizures refractory to increased AED use. Family meeting held with agreement to continue to escalate treatment and proceed with intubation if needed.  3/8 Intubated  3/10 continues to have breakthrough seizures  3/14-no further seizures.  Patient awake and following commands.  Extubated.   SUBJECTIVE/OVERNIGHT/INTERVAL HX   Successfully extubated  yesterday.  Remains dysarthric with difficulty handling oral secretions.  Objective   Blood pressure 121/82, pulse  81, temperature 99.2 F (37.3 C), temperature source Axillary, resp. rate (!) 21, height 5' 7.01" (1.702 m), weight 86 kg, SpO2 98%.        Intake/Output Summary (Last 24 hours) at 05/10/2023 1028 Last data filed at 05/10/2023 1610 Gross per 24 hour  Intake 1250.22 ml  Output 700 ml  Net 550.22 ml   Filed Weights   05/08/23 0424 05/09/23 0500 05/10/23 0500  Weight: 84.9 kg 85.6 kg 86 kg   Physical Exam: Gen:      Sitting in bed in mild distress from oral secretions.  Awake. HEENT: Copious clear oral secretions.  No neck swelling.  No lip or tongue swelling noted. Lungs:    Clear bilaterally CV:         HS normal  Abd:    Soft Ext:   No edema Skin:      Warm and dry; no rash Neuro: Awake.  Left facial droop.  Speech very dysarthric.  Left-sided weakness is much improved compared to yesterday.  Strength is now nearly symmetric.  Continuous EEG shows occasionally GPD, but no clear seizure activity.  Unchanged from yesterday.   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  Hypertensive and Bradycardic on lopressor 04/26/23 Normal echo 04/26/23 Hep B antibody positive, chronic Mild anemia of critical illness  Mild thrombocytopenia - mild  Plan:  -Successfully extubated yesterday but has significant dysarthria with inability to handle secretions.  No obvious anatomic obstruction. 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  Hopefully some of this dysarthria  will also improve. -Despite copious secretions still appears to be protecting his airway.  Have started a scopolamine patch to help dry up oral secretions -Continue PT OT and especially SLP. -Continue nasogastric feeds. -Complete 5 days of antibiotics for aspiration pneumonia. -Neurology to advise regarding weaning some anticonvulsants.  Will stop LTM at this time. -I am concerned that some of his neurological deficits may persist.  If he he remains unable to eat and talk appropriately this would significantly impact his quality of life.  In that context, I am uncertain that he would benefit from reintubation as this would probably indicate the beginning of a vicious cycle of recurrent intubation for aspiration.  Palliative care consultation for goals of care planning going forward. -Continue dexamethasone for now.  Need to come up with a weaning plan soon.   Best practice:  Diet: Tube feeds Pain/Anxiety/Delirium protocol (if indicated): Off all sedation VAP protocol (if indicated): Now extubated DVT prophylaxis: Lovenox GI prophylaxis: Home PPI Glucose control: SSI - adequate control Mobility: Aggressive ambulation Code Status: full Family Communication: pending  CRITICAL CARE Performed by: Lynnell Catalan   Total critical care time: 40 minutes  Critical care time was exclusive of separately billable procedures and treating other patients.  Critical care was necessary to treat or prevent imminent or life-threatening deterioration.  Critical care was time spent personally by me on the following activities: development of treatment plan with patient and/or surrogate as well as nursing, discussions with consultants, evaluation of patient's response to treatment, examination of patient, obtaining history from patient or surrogate, ordering and performing treatments and interventions, ordering and  review of laboratory studies, ordering and review of radiographic studies, pulse oximetry,  re-evaluation of patient's condition and participation in multidisciplinary rounds.  Lynnell Catalan, MD [facility] ICU Physician [facility]  Pager: [phone] Mobile: [phone] After hours: [phone].

## 2023-05-10 NOTE — Plan of Care (Signed)
  Problem: Education: Goal: Knowledge of General Education information will improve Description: Including pain rating scale, medication(s)/side effects and non-pharmacologic comfort measures Outcome: Progressing   Problem: Health Behavior/Discharge Planning: Goal: Ability to manage health-related needs will improve Outcome: Progressing   Problem: Clinical Measurements: Goal: Ability to maintain clinical measurements within normal limits will improve Outcome: Progressing Goal: Will remain free from infection Outcome: Progressing Goal: Diagnostic test results will improve Outcome: Progressing Goal: Respiratory complications will improve Outcome: Progressing Goal: Cardiovascular complication will be avoided Outcome: Progressing   Problem: Activity: Goal: Risk for activity intolerance will decrease Outcome: Progressing   Problem: Nutrition: Goal: Adequate nutrition will be maintained Outcome: Progressing   Problem: Coping: Goal: Level of anxiety will decrease Outcome: Progressing   Problem: Elimination: Goal: Will not experience complications related to bowel motility Outcome: Progressing Goal: Will not experience complications related to urinary retention Outcome: Progressing   Problem: Pain Managment: Goal: General experience of comfort will improve and/or be controlled Outcome: Progressing   Problem: Safety: Goal: Ability to remain free from injury will improve Outcome: Progressing   Problem: Skin Integrity: Goal: Risk for impaired skin integrity will decrease Outcome: Progressing   Problem: Education: Goal: Knowledge of the prescribed therapeutic regimen will improve Outcome: Progressing   Problem: Activity: Goal: Ability to implement measures to reduce episodes of fatigue will improve Outcome: Progressing   Problem: Bowel/Gastric: Goal: Will not experience complications related to bowel motility Outcome: Progressing   Problem: Coping: Goal: Ability to  identify and develop effective coping behavior will improve Outcome: Progressing   Problem: Nutritional: Goal: Maintenance of adequate nutrition will improve Outcome: Progressing   Problem: Education: Goal: Ability to describe self-care measures that may prevent or decrease complications (Diabetes Survival Skills Education) will improve Outcome: Progressing Goal: Individualized Educational Video(s) Outcome: Progressing   Problem: Coping: Goal: Ability to adjust to condition or change in health will improve Outcome: Progressing   Problem: Fluid Volume: Goal: Ability to maintain a balanced intake and output will improve Outcome: Progressing   Problem: Health Behavior/Discharge Planning: Goal: Ability to identify and utilize available resources and services will improve Outcome: Progressing Goal: Ability to manage health-related needs will improve Outcome: Progressing   Problem: Metabolic: Goal: Ability to maintain appropriate glucose levels will improve Outcome: Progressing   Problem: Nutritional: Goal: Maintenance of adequate nutrition will improve Outcome: Progressing Goal: Progress toward achieving an optimal weight will improve Outcome: Progressing   Problem: Skin Integrity: Goal: Risk for impaired skin integrity will decrease Outcome: Progressing   Problem: Tissue Perfusion: Goal: Adequacy of tissue perfusion will improve Outcome: Progressing   Problem: Activity: Goal: Ability to tolerate increased activity will improve Outcome: Progressing   Problem: Respiratory: Goal: Ability to maintain a clear airway and adequate ventilation will improve Outcome: Progressing   Problem: Role Relationship: Goal: Method of communication will improve Outcome: Progressing

## 2023-05-10 NOTE — Plan of Care (Signed)
  Problem: Clinical Measurements: Goal: Ability to maintain clinical measurements within normal limits will improve Outcome: Progressing Goal: Respiratory complications will improve Outcome: Progressing Goal: Cardiovascular complication will be avoided Outcome: Progressing   Problem: Nutrition: Goal: Adequate nutrition will be maintained Outcome: Progressing   Problem: Coping: Goal: Level of anxiety will decrease Outcome: Progressing   Problem: Elimination: Goal: Will not experience complications related to bowel motility Outcome: Progressing Goal: Will not experience complications related to urinary retention Outcome: Progressing   Problem: Pain Managment: Goal: General experience of comfort will improve and/or be controlled Outcome: Progressing   Problem: Skin Integrity: Goal: Risk for impaired skin integrity will decrease Outcome: Progressing

## 2023-05-10 NOTE — Progress Notes (Signed)
 NEUROLOGY CONSULT FOLLOW UP NOTE   Date of service: May 10, 2023 Patient Name: Tyler [name] MRN:  [id number] DOB:  1950/07/31  Interval Hx/subjective   Patient extubated *** No electrographic seizures on EEG Stroke workup completed Vitals   Vitals:   05/10/23 0900 05/10/23 1000 05/10/23 1100 05/10/23 1200  BP: (!) 127/106 (!) 142/93 136/81 (!) 136/105  Pulse: 86 93 92 96  Resp: (!) 25 15 (!) 30 (!) 22  Temp:      TempSrc:      SpO2: 95% 92% 96% 96%  Weight:      Height:         Body mass index is 29.69 kg/m.  Physical Exam   Constitutional: Appears well-developed and well-nourished. *** Psych: Affect appropriate to situation. *** Eyes: No scleral injection. *** HENT: No OP obstrucion. *** Head: Normocephalic. *** Cardiovascular: Normal rate and regular rhythm. *** Respiratory: Effort normal, non-labored breathing. *** GI: Soft.  No distension. There is no tenderness. *** Skin: WDI. ***  Neurologic Examination   ***  Medications  Current Facility-Administered Medications:    0.9 %  sodium chloride infusion, , Intravenous, Continuous, Candise Che, Corene Cornea, MD, Last Rate: 10 mL/hr at 05/03/23 1016, 10 mL/hr at 05/03/23 1016   acetaminophen (TYLENOL) tablet 650 mg, 650 mg, Per Tube, Q4H PRN, Kalman Shan, MD, 650 mg at 05/10/23 0438   alum & mag hydroxide-simeth (MAALOX/MYLANTA) 200-200-20 MG/5ML suspension 60 mL, 60 mL, Per Tube, Q4H PRN, Kalman Shan, MD   Chlorhexidine Gluconate Cloth 2 % PADS 6 each, 6 each, Topical, Nightly, Josiah Lobo T, MD, 6 each at 05/10/23 1044   cholecalciferol (VITAMIN D3) 25 MCG (1000 UNIT) tablet 2,000 Units, 2,000 Units, Per Tube, Daily, Kalman Shan, MD, 2,000 Units at 05/10/23 0915   cyanocobalamin (VITAMIN B12) tablet 1,000 mcg, 1,000 mcg, Per Tube, Daily, Luciano Cutter, MD, 1,000 mcg at 05/10/23 0916   dexamethasone (DECADRON) tablet 4 mg, 4 mg, Per Tube, Q12H, Johney Maine, MD, 4 mg at 05/10/23  0915   enoxaparin (LOVENOX) injection 40 mg, 40 mg, Subcutaneous, Q24H, Candise Che, Gautam Kishore, MD, 40 mg at 05/09/23 2109   feeding supplement (OSMOLITE 1.5 CAL) liquid 1,000 mL, 1,000 mL, Per Tube, Continuous, Luciano Cutter, MD, Last Rate: 55 mL/hr at 05/10/23 1200, Infusion Verify at 05/10/23 1200   feeding supplement (PROSource TF20) liquid 60 mL, 60 mL, Per Tube, BID, Luciano Cutter, MD, 60 mL at 05/10/23 0915   folic acid (FOLVITE) tablet 1 mg, 1 mg, Per Tube, Daily, Luciano Cutter, MD, 1 mg at 05/10/23 0915   gabapentin (NEURONTIN) 250 MG/5ML solution 100 mg, 100 mg, Per Tube, Q8H, Mannam, Praveen, MD, 100 mg at 05/10/23 1303   hydrALAZINE (APRESOLINE) tablet 50 mg, 50 mg, Per Tube, Q8H PRN, Luciano Cutter, MD   hydrocortisone (ANUSOL-HC) 2.5 % rectal cream 1 Application, 1 Application, Rectal, BID PRN, Johney Maine, MD   ibuprofen (ADVIL) 100 MG/5ML suspension 200 mg, 200 mg, Per Tube, Q6H PRN, Kalman Shan, MD, 200 mg at 05/09/23 1456   insulin aspart (novoLOG) injection 0-15 Units, 0-15 Units, Subcutaneous, Q4H, Luciano Cutter, MD, 2 Units at 05/10/23 0900   lacosamide (VIMPAT) tablet 200 mg, 200 mg, Per Tube, BID, Charlsie Quest, MD, 200 mg at 05/10/23 0915   levETIRAcetam (KEPPRA) 100 MG/ML solution 2,000 mg, 2,000 mg, Per Tube, BID, Lindie Spruce O, MD, 2,000 mg at 05/10/23 0900   lidocaine (LMX) 4 % cream, , Topical, TID PRN,  Jimmye Norman, NP   LORazepam (ATIVAN) injection 2 mg, 2 mg, Intravenous, Q4H PRN, Charlsie Quest, MD   magic mouthwash w/lidocaine, 5 mL, Oral, QID PRN, Candise Che, Corene Cornea, MD   ondansetron [facility]) tablet 4-8 mg, 4-8 mg, Per Tube, Q8H PRN **OR** ondansetron (ZOFRAN-ODT) disintegrating tablet 4-8 mg, 4-8 mg, Oral, Q8H PRN **OR** ondansetron (ZOFRAN) injection 4 mg, 4 mg, Intravenous, Q8H PRN **OR** ondansetron (ZOFRAN) 8 mg in sodium chloride 0.9 % 50 mL IVPB, 8 mg, Intravenous, Q8H PRN, Leander Rams, [facility]   Oral care  mouth rinse, 15 mL, Mouth Rinse, PRN, Luciano Cutter, MD   Oral care mouth rinse, 15 mL, Mouth Rinse, 4 times per day, Lynnell Catalan, MD, 15 mL at 05/10/23 1159   piperacillin-tazobactam (ZOSYN) IVPB 3.375 g, 3.375 g, Intravenous, Q8H, Agarwala, Daleen Bo, MD, Stopped at 05/10/23 0924   polyethylene glycol (MIRALAX / GLYCOLAX) packet 17 g, 17 g, Per Tube, Daily PRN, Leander Rams, RPH   scopolamine (TRANSDERM-SCOP) 1 MG/3DAYS 1.5 mg, 1 patch, Transdermal, Q72H, Agarwala, Ravi, MD, 1.5 mg at 05/09/23 1456   senna-docusate (Senokot-S) tablet 1 tablet, 1 tablet, Per Tube, QHS PRN, Alphia Moh D, RPH   sodium chloride flush (NS) 0.9 % injection 10-40 mL, 10-40 mL, Intracatheter, Q12H, Johney Maine, MD, 10 mL at 05/10/23 0916   sodium chloride flush (NS) 0.9 % injection 10-40 mL, 10-40 mL, Intracatheter, PRN, Johney Maine, MD, 30 mL at 04/27/23 0540   tenofovir alafenamide (VEMLIDY) tablet 25 mg, 25 mg, Per Tube, Daily, Danelle Earthly, MD, 25 mg at 05/10/23 0915   thiamine (VITAMIN B1) tablet 100 mg, 100 mg, Per Tube, Daily, Luciano Cutter, MD, 100 mg at 05/10/23 0915  Labs and Diagnostic Imaging   CBC:  Recent Labs  Lab 05/08/23 0440 05/09/23 0407  WBC 3.9* 4.8  HGB 11.4* 10.9*  HCT 34.0* 32.7*  MCV 88.8 89.8  PLT 214 227    Basic Metabolic Panel:  Lab Results  Component Value Date   NA 130 (L) 05/09/2023   K 4.2 05/09/2023   CO2 24 05/09/2023   GLUCOSE 172 (H) 05/09/2023   BUN 28 (H) 05/09/2023   CREATININE 0.87 05/09/2023   CALCIUM 8.0 (L) 05/09/2023   GFRNONAA >60 05/09/2023   GFRAA >60 09/11/2019   Lipid Panel: No results found for: "LDLCALC" HgbA1c:  Lab Results  Component Value Date   HGBA1C 5.2 04/09/2023   Urine Drug Screen: No results found for: "LABOPIA", "COCAINSCRNUR", "LABBENZ", "AMPHETMU", "THCU", "LABBARB"  Alcohol Level No results found for: "ETH" INR  Lab Results  Component Value Date   INR 1.2 04/07/2023   APTT  Lab Results   Component Value Date   APTT 28 07/29/2022   AED levels: No results found for: "PHENYTOIN", "ZONISAMIDE", "LAMOTRIGINE", "LEVETIRACETA"  CT angio Head and Neck with contrast(Personally reviewed): CTA H&N 1. No emergent large vessel occlusion or high-grade stenosis of the intracranial arteries. 2. Hypoattenuation throughout much of the right hemisphere, corresponding to the area of hyperintense T2-weighted signal on recent MRI. No intracranial hemorrhage.  MRI Brain(Personally reviewed): 1. Resolving diffusion abnormality within the posterior right MCA territory. 2. Increased extent of hyperintense T2-weighted signal in the right hemisphere. This could indicate progression of lymphoma, or confluence of post-ischemic/post-ictal edema cytotoxic edema and lymphoma-related vasogenic edema. Compared to 04/07/2023, however, the area of abnormal signal has decreased. 3. Unchanged appearance of intraparenchymal hemorrhage in the posterior right parietal lobe. **See plan below for my detailed personal read  of this study  TTE performed 04/26/23 showed normal LVEF, no intracardiac clot or other sig abnl. No indication to repeat it.   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  His seizures are improved on current regimen of vimpat, keppra, and gabapentin. He has been seizure free by EEG for >24 hrs.   MRI brain 3/11 personal review showed a different pattern of restricted diffusion and edema that we are concerned represents MCA branch infarction.   The distribution does involve the anterior temporal lobe as well which would be unusual as well as thalamic involvement so I think that we are dealing with multiple etiologies for the current appearance.  Part of this may be postictal change.  However the isolation of the cytotoxic edema to a MCA branch distribution is highly suggestive that he has had an ischemic stroke in addition to his seizures. Stroke workup is now completed, see results above.   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 Given his anemia I will start him on daily aspirin 81mg  monotherapy. - Please check LDL and add statin if indicated to achieve goal LDL <70 - STAT head CT for any change in neuro exam - Tele - PT/OT/SLP - Stroke education - Amb referral to neurology upon discharge   # Seizures, now controlled -Continue Vimpat  200 mg twice daily -Continue Keppra 2000 mg twice daily -Continue gabapentin 100 mg 3 times daily -D/c LTM  No further inpatient neurologic workup recommended.  Neurology to sign off but please reengage if additional neurologic concerns arise. D/w Dr. Denese Killings. ______________________________________________________________________   Signed, Jefferson Fuel, MD [facility]

## 2023-05-10 NOTE — Evaluation (Signed)
 Occupational Therapy Evaluation Patient Details Name: Tyler [name] MRN: [id number] DOB: 1950-10-05 Today's Date: 05/10/2023   History of Present Illness   73 yo male presents on 2/25 for second cycle of high dose methotrexate with leucovorin rescue. 2/27 MRI showed marked interval decrease in size of right parietal mass/lymphoma and new diffusion signal abnormality involving the right insular cortex and overlying right frontal lobe, favored to reflect sequelae of seizure. 2/28 transferred to ICU and intubated. 3/2 extubated. 3/8 with seizures and need for airway protection so re-intubated and continued to have breakthrough seizures; extubated 3/14. EEG with evidence of epileptogenicity arising from R hemisphere, maximal R parietal region and cortical dysfunction in R hemisphere. PMH: CNS relapse of large B-cell lymphoma, partial seizure, paroxysmal atrial fib not on anticoagulation due to bleeding risk, prostate cancer status post prostatectomy 2021, pulmonary embolism.     Clinical Impressions Pt extubated and re-evaluated. Pt requiring significantly more assist and with LUE weakness, poor balance, awareness, safety, cognition. Pt needing grossly total A for ADL. Pt was able to wash face with min A today and with inconsistent attempts to self-suction mouth noting poor proprioception with undershooting with R hand control of suction. Pt  able to sit EOB ~15 mins with CGA-mod A and perform STS with good initiation of R but ultimately total A +2 with LLE block. Recommending inpatient rehab >3 hours for follow up therapy.      If plan is discharge home, recommend the following:   Assistance with cooking/housework;Assistance with feeding;Assist for transportation;Help with stairs or ramp for entrance;Direct supervision/assist for medications management;Direct supervision/assist for financial management;Two people to help with walking and/or transfers;Two people to help with  bathing/dressing/bathroom     Functional Status Assessment   Patient has had a recent decline in their functional status and demonstrates the ability to make significant improvements in function in a reasonable and predictable amount of time.     Equipment Recommendations   Other (comment) (defer)     Recommendations for Other Services         Precautions/Restrictions   Precautions Precautions: Fall;Other (comment) Recall of Precautions/Restrictions: Impaired Precaution/Restrictions Comments: fecal pouch, cortrack     Mobility Bed Mobility Overal bed mobility: Needs Assistance Bed Mobility: Supine to Sit, Sit to Supine     Supine to sit: Total assist, +2 for physical assistance, +2 for safety/equipment Sit to supine: Total assist, +2 for physical assistance, +2 for safety/equipment        Transfers Overall transfer level: Needs assistance Equipment used: 2 person hand held assist Transfers: Sit to/from Stand Sit to Stand: Total assist, +2 physical assistance, +2 safety/equipment           General transfer comment: fair initiation on the R but ultimatley total A with L knee block      Balance Overall balance assessment: Needs assistance Sitting-balance support: Feet supported, No upper extremity supported Sitting balance-Leahy Scale: Poor Sitting balance - Comments: CGA-mod A; frequent reaching to bed rail or therapist with RUE for support   Standing balance support: Bilateral upper extremity supported Standing balance-Leahy Scale: Zero                             ADL either performed or assessed with clinical judgement   ADL Overall ADL's : Needs assistance/impaired     Grooming: Wash/dry face;Minimal assistance;Sitting Grooming Details (indicate cue type and reason): with RUE Upper Body Bathing: Maximal assistance;Sitting   Lower Body Bathing:  Total assistance;+2 for physical assistance;+2 for safety/equipment;Sit to/from stand    Upper Body Dressing : Maximal assistance;Sitting   Lower Body Dressing: Total assistance;+2 for physical assistance;+2 for safety/equipment;Sit to/from stand   Toilet Transfer: Total assistance;+2 for physical assistance;+2 for safety/equipment Toilet Transfer Details (indicate cue type and reason): for STS           General ADL Comments: Pt needs increased time to follow commands, Distracted internally by urge to use restroom but has catheyer and fecal pouch     Vision Patient Visual Report: No change from baseline Vision Assessment?: Vision impaired- to be further tested in functional context Additional Comments: Decr visual attention to L able to scan toward L with compensatory head turn but not observed with eyes past midline toward L today     Perception Perception: Impaired Preception Impairment Details: Inattention/Neglect Perception-Other Comments: L   Praxis         Pertinent Vitals/Pain Pain Assessment Pain Assessment: No/denies pain Pain Intervention(s): Monitored during session     Extremity/Trunk Assessment Upper Extremity Assessment Upper Extremity Assessment: Generalized weakness;LUE deficits/detail LUE Deficits / Details: weak, poor command following on L. pt able to move in gravity eliminated plain but not through full range through PROM functional. Pt not using functionally   Lower Extremity Assessment Lower Extremity Assessment: Defer to PT evaluation       Communication Communication Communication: Impaired Factors Affecting Communication: Reduced clarity of speech   Cognition Arousal: Alert Behavior During Therapy: Flat affect, Impulsive Cognition: No family/caregiver present to determine baseline                               Following commands: Impaired Following commands impaired: Follows one step commands inconsistently, Follows one step commands with increased time     Cueing  General Comments   Cueing Techniques:  Verbal cues;Gestural cues;Tactile cues  HR up to 130s with exertion   Exercises     Shoulder Instructions      Home Living Family/patient expects to be discharged to:: Private residence Living Arrangements: Spouse/significant other Available Help at Discharge: Family Type of Home: Apartment Home Access: Level entry Entrance Stairs-Number of Steps: none Entrance Stairs-Rails: Right;Left Home Layout: One level     Bathroom Shower/Tub: Chief Strategy Officer: Standard     Home Equipment: Agricultural consultant (2 wheels);BSC/3in1;Shower seat          Prior Functioning/Environment Prior Level of Function : Independent/Modified Independent             Mobility Comments: occasional RW use ADLs Comments: Independent to Mod I with occasional assistance from children    OT Problem List: Impaired balance (sitting and/or standing);Decreased coordination;Decreased safety awareness;Impaired sensation;Impaired tone;Impaired UE functional use;Decreased cognition;Decreased strength;Decreased range of motion;Decreased activity tolerance   OT Treatment/Interventions: Self-care/ADL training;Therapeutic activities;Visual/perceptual remediation/compensation;Patient/family education;Balance training;Cognitive remediation/compensation;DME and/or AE instruction;Therapeutic exercise      OT Goals(Current goals can be found in the care plan section)   Acute Rehab OT Goals Patient Stated Goal: go to restroom OT Goal Formulation: With patient Time For Goal Achievement: 05/24/23 Potential to Achieve Goals: Fair   OT Frequency:  Min 1X/week    Co-evaluation PT/OT/SLP Co-Evaluation/Treatment: Yes Reason for Co-Treatment: For patient/therapist safety;To address functional/ADL transfers PT goals addressed during session: Mobility/safety with mobility;Balance;Proper use of DME OT goals addressed during session: ADL's and self-care      AM-PAC OT "6 Clicks" Daily Activity  Outcome Measure Help from another person eating meals?: Total Help from another person taking care of personal grooming?: Total Help from another person toileting, which includes using toliet, bedpan, or urinal?: Total Help from another person bathing (including washing, rinsing, drying)?: Total Help from another person to put on and taking off regular upper body clothing?: Total Help from another person to put on and taking off regular lower body clothing?: Total 6 Click Score: 6   End of Session Equipment Utilized During Treatment: Gait belt Nurse Communication: Mobility status  Activity Tolerance: Patient tolerated treatment well Patient left: in bed;with call bell/phone within reach;with bed alarm set;with nursing/sitter in room  OT Visit Diagnosis: Unsteadiness on feet (R26.81);Other abnormalities of gait and mobility (R26.89);Muscle weakness (generalized) (M62.81);Other symptoms and signs involving cognitive function;Low vision, both eyes (H54.2)                Time: 6962-9528 OT Time Calculation (min): 29 min Charges:  OT General Charges $OT Visit: 1 Visit OT Evaluation $OT Re-eval: 1 Re-eval  Tyler Deis, OTR/L [facility] Acute Rehabilitation Office: [phone]   Myrla Halsted 05/10/2023, 5:42 PM

## 2023-05-10 NOTE — Progress Notes (Signed)
 LTM EEG disconnected - no skin breakdown at [facility].

## 2023-05-10 NOTE — Procedures (Addendum)
 Patient Name: Tyler [name]  MRN: [id number]  Epilepsy Attending: Charlsie Quest  Referring Physician/Provider: Gordy Councilman, MD  Duration: 05/09/2023 1608 to 05/10/2023 1020   Patient history: 72yo M patient with 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 EEG to evaluate for seizure    Level of alertness: lethargic   AEDs during EEG study: LEV, LCM, GBP   Technical aspects: This EEG study was done with scalp electrodes positioned according to the 10-20 International system of electrode placement. Electrical activity was reviewed with band pass filter of 1-70Hz , sensitivity of 7 uV/mm, display speed of 81mm/sec with a 60Hz  notched filter applied as appropriate. EEG data were recorded continuously and digitally stored.  Video monitoring was available and reviewed as appropriate.   Description: No clear posterior dominant rhythm was seen. Sleep was characterized by sleep spindles (12 to 14 Hz), maximal frontocentral region.  EEG showed continuous generalized and maximal right parietal 3 to 6 Hz theta-delta slowing. Lateralized periodic discharges were noted in right hemisphere, maximal right parietal region with fluctuating frequency of 0.25-1Hz . Hyperventilation and photic stimulation were not performed.      ABNORMALITY -Lateralized periodic discharges, right hemisphere, maximal right parietal  region ( LPD) -Continuous slow, generalized and maximal right parietal region   IMPRESSION: This study showed evidence of epileptogenicity arising from right hemisphere, maximal right parietal region. Additionally there is cortical dysfunction in right hemisphere, maximal right parietal region likely secondary to underlying structural abnormality. Lastly there is mild diffuse encephalopathy. No  seizures were noted.   [name] [name]

## 2023-05-11 DIAGNOSIS — R569 Unspecified convulsions: Secondary | ICD-10-CM | POA: Diagnosis not present

## 2023-05-11 LAB — BASIC METABOLIC PANEL
Anion gap: 7 (ref 5–15)
BUN: 22 mg/dL (ref 8–23)
CO2: 29 mmol/L (ref 22–32)
Calcium: 8.4 mg/dL — ABNORMAL LOW (ref 8.9–10.3)
Chloride: 98 mmol/L (ref 98–111)
Creatinine, Ser: 0.73 mg/dL (ref 0.61–1.24)
GFR, Estimated: >60 mL/min (ref >60)
Glucose, Bld: 129 mg/dL — ABNORMAL HIGH (ref 70–99)
Potassium: 4.2 mmol/L (ref 3.5–5.1)
Sodium: 134 mmol/L — ABNORMAL LOW (ref 135–145)

## 2023-05-11 LAB — CBC
HCT: 35.3 % — ABNORMAL LOW (ref 39.0–52.0)
Hemoglobin: 11.8 g/dL — ABNORMAL LOW (ref 13.0–17.0)
MCH: 30.3 pg (ref 26.0–34.0)
MCHC: 33.4 g/dL (ref 30.0–36.0)
MCV: 90.5 fL (ref 80.0–100.0)
Platelets: 267 10*3/uL (ref 150–400)
RBC: 3.9 MIL/uL — ABNORMAL LOW (ref 4.22–5.81)
RDW: 13.4 % (ref 11.5–15.5)
WBC: 7.4 10*3/uL (ref 4.0–10.5)
nRBC: 0.3 % — ABNORMAL HIGH (ref 0.0–0.2)

## 2023-05-11 LAB — PHOSPHORUS: Phosphorus: 3.4 mg/dL (ref 2.5–4.6)

## 2023-05-11 LAB — GLUCOSE, CAPILLARY
Glucose-Capillary: 111 mg/dL — ABNORMAL HIGH (ref 70–99)
Glucose-Capillary: 116 mg/dL — ABNORMAL HIGH (ref 70–99)
Glucose-Capillary: 145 mg/dL — ABNORMAL HIGH (ref 70–99)
Glucose-Capillary: 165 mg/dL — ABNORMAL HIGH (ref 70–99)
Glucose-Capillary: 96 mg/dL (ref 70–99)

## 2023-05-11 LAB — MAGNESIUM: Magnesium: 2.1 mg/dL (ref 1.7–2.4)

## 2023-05-11 MED ORDER — SODIUM CHLORIDE 0.9% FLUSH: 3.0000 mL | INTRAVENOUS | Status: DC | PRN

## 2023-05-11 MED ORDER — ASPIRIN 81 MG PO TBEC
81.0000 mg | DELAYED_RELEASE_TABLET | Freq: Every day | ORAL | Status: DC
  Administered 2023-05-12: 81 mg via ORAL
  Filled 2023-05-11 (×2): qty 1

## 2023-05-11 MED ORDER — SODIUM CHLORIDE 0.9% FLUSH
3.0000 mL | Freq: Two times a day (BID) | INTRAVENOUS | Status: DC
  Administered 2023-05-11: 3 mL via INTRAVENOUS
  Administered 2023-05-11: 10 mL via INTRAVENOUS
  Administered 2023-05-12: 3 mL via INTRAVENOUS
  Administered 2023-05-12 – 2023-05-16 (×6): 10 mL via INTRAVENOUS
  Administered 2023-05-16: 5 mL via INTRAVENOUS
  Administered 2023-05-17: 3 mL via INTRAVENOUS
  Administered 2023-05-17 – 2023-05-18 (×2): 10 mL via INTRAVENOUS
  Administered 2023-05-18 – 2023-05-19 (×2): 3 mL via INTRAVENOUS
  Administered 2023-05-19 – 2023-05-20 (×2): 10 mL via INTRAVENOUS
  Administered 2023-05-20 – 2023-05-22 (×5): 3 mL via INTRAVENOUS

## 2023-05-11 NOTE — Progress Notes (Signed)
 NAME:  Tyler [name], MRN:  [id number], DOB:  05-Jan-1951, LOS: 19 ADMISSION DATE:  04/22/2023, CONSULTATION/ SERVICE  DATE:  04/25/23 REFERRING MD:  Dr. Candise Che, CHIEF COMPLAINT: Neck swelling, drooling, difficulty swallowing, altered mental status with concern for partial seizures  History of Present Illness:  Tyler [name] is a 73 year old gentleman with significant history of CNS relapse of large B-cell lymphoma, partial seizure who was admitted to oncology service on 2/25 for second cycle of high-dose methotrexate with leucovorin rescue.  Patient also has prior history of paroxysmal atrial fibrillation not on anticoagulation due to bleeding risk, prostate cancer status post prostatectomy in 2021, pulmonary embolism, hepatitis B exposure.  Patient had high-grade large B-cell lymphoma and completed 6 cycles of R-CHOP with systemic remission in the past.  As per the primary oncologist, patient was doing well and receiving his medication as planned.  He was started on bicarb drip to minimize the methotrexate related toxicity.  However, this evening, oncologist noted increased neck swelling as well as patient had difficulty swallowing.  He was also noted to have drooling of saliva and developed altered mental status with facial twitching concerning for seizure activity.  There was concern about patient's ability to protect airway and possible angioedema (neck swelling), ED was consulted and patient was intubated emergently.  PMH    has a past medical history of Cancer (HCC), Follicular lymphoma (HCC) (2024), History of pulmonary embolism, Hypertension, Myelodysplastic syndrome (HCC), PAF (paroxysmal atrial fibrillation) (HCC), and Recurrent UTI.   has a past surgical history that includes Robot assisted laparoscopic radical prostatectomy (N/A, 09/01/2019); Lymphadenectomy (Bilateral, 09/01/2019); Esophagogastroduodenoscopy (egd) with propofol (N/A, 08/21/2022); biopsy (08/21/2022); IR IMAGING GUIDED PORT  INSERTION (08/26/2022); Craniotomy (Right, 03/10/2023); and Application of cranial navigation (Right, 03/10/2023).   Significant events   04/22/2023 - ADMIT  2/27  - MRI brain - Marked interval decrease in size of right parietal mass/lymphoma, now measuring 2.9 x 3.5 x 2.3 cm (previously 5.4 x 3.6 x 4.4 cm), consistent with interval response to therapy. Persistent but improved vasogenic edema within the adjacent posterior right cerebral hemisphere, with interval resolution of previously seen right-to-left midline shift. 2. 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 Correlation with symptomatology and EEG recommended. Evolving changes of subacute ischemia would be the primary differential consideration, but are felt to be less likely.  2/28 - Transferred to ICU and intubated. Witnessed by Dr Candise Che with have left neck swelling, gurgling, and twitching left side -> then partially respnsvive -> ativan/confused -> EDP intubated. Around 7.30-8pm  04/26/23: Coretrack placed early hours .Intubated early hours.  On the ventilator FiO2 50%.  Low-grade fever since 04/17/2023 but normal white count. Ceribell EEG oongoing. Prmary language for aptient is Swahili. SBP HIGH with HR 40s - on scheduled lopressor . Ceribel shows    ceribell EEG is is suggestive of severe diffuse encephalopathy. No seizures were seen throughout the recording.   3/2 Extubated. Remains on PANDA. Scheduled for barium today. Remains on cEEG per Neuro  3/3-3/7 Continues to have focal seizures refractory to increased AED use. Family meeting held with agreement to continue to escalate treatment and proceed with intubation if needed.  3/8 Intubated  3/10 continues to have breakthrough seizures  3/14-no further seizures.  Patient awake and following commands.  Extubated.   SUBJECTIVE/OVERNIGHT/INTERVAL HX   Successfully extubated 2 days  ago.  Remains dysarthric with copious oral secretions but no respiratory distress.  Objective   Blood pressure (!) 140/91, pulse 87, temperature 99.4 F (37.4 C), temperature source Axillary, resp. rate (!) 23, height 5' 7.01" (1.702 m), weight 86 kg, SpO2 97%.        Intake/Output Summary (Last 24 hours) at 05/11/2023 1003 Last data filed at 05/11/2023 0917 Gross per 24 hour  Intake 1505.3 ml  Output 1675 ml  Net -169.7 ml   Filed Weights   05/09/23 0500 05/10/23 0500 05/11/23 0500  Weight: 85.6 kg 86 kg 86 kg   Physical Exam: Gen:      Sitting in bed in no distress. HEENT: Copious clear oral secretions.  No neck swelling.  No lip or tongue swelling noted.  Less drooling than yesterday on scopolamine. Lungs:    Clear bilaterally, no wheezes rhonchi or increased work of breathing CV:         HS normal  Abd:    Soft.  Smallbore feeding tube infusing tube feed.  Rectal tube in place with liquid stool Ext:   No edema Skin:      Warm and dry; no rash Neuro: Awake.  Left facial droop.  Speech very dysarthric.  Left-sided weakness is much improved compared to yesterday.  Strength is now nearly symmetric.  Attempting to communicate by mimicry.  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  Hypertensive and Bradycardic on lopressor 04/26/23 Normal echo 04/26/23 Hep B antibody positive, chronic Mild anemia of critical illness  Mild thrombocytopenia - mild  Plan:  -Successfully extubated yesterday but has significant dysarthria with inability to handle secretions.  No obvious anatomic  obstruction. 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  Hopefully some of this dysarthria will also improve over time, however, may be related to recent stroke and may be permanent.. -Despite copious secretions still appears to be protecting his airway.  Have started a scopolamine patch to help dry up oral secretions. -Continue PT OT and especially SLP. -Continue nasogastric feeds. -Complete 5 days of antibiotics for aspiration pneumonia. -Continue current anticonvulsants. -I am concerned that some of his neurological deficits may persist.  If he he remains unable to eat and talk appropriately this would significantly impact his quality of life.  In that context, I am uncertain that he would benefit from reintubation as this would probably indicate the beginning of a vicious cycle of recurrent intubation for aspiration.  Palliative care consultation for goals of care planning going forward. -Continue dexamethasone for now.  Need to come up with a weaning plan soon. -Ready for transfer to PCU.  Orders reconciled and hospitalist notified.  Best practice:  Diet: Tube feeds Pain/Anxiety/Delirium protocol (if indicated): Off all sedation VAP protocol (if indicated): Now extubated DVT prophylaxis: Lovenox GI prophylaxis: Home PPI Glucose control: SSI - adequate control Mobility: Aggressive ambulation Code Status: full Family Communication: pending   Lynnell Catalan, MD [facility] ICU Physician [facility]  Pager: [phone] Mobile: [phone] After hours: [phone].

## 2023-05-11 NOTE — Evaluation (Signed)
 Clinical/Bedside Swallow Evaluation Patient Details  Name: Tyler [name] MRN: [id number] Date of Birth: 1950-04-18  Today's Date: 05/11/2023 Time: SLP Start Time (ACUTE ONLY): 1436 SLP Stop Time (ACUTE ONLY): 1450 SLP Time Calculation (min) (ACUTE ONLY): 14 min  Past Medical History:  Past Medical History:  Diagnosis Date   Cancer (HCC)    prostate   Follicular lymphoma (HCC) 2024   History of pulmonary embolism    Hypertension    Myelodysplastic syndrome (HCC)    PAF (paroxysmal atrial fibrillation) (HCC)    Recurrent UTI    Past Surgical History:  Past Surgical History:  Procedure Laterality Date   APPLICATION OF CRANIAL NAVIGATION Right 03/10/2023   Procedure: APPLICATION OF CRANIAL NAVIGATION;  Surgeon: Tressie Stalker, MD;  Location: [facility] OR;  Service: Neurosurgery;  Laterality: Right;   BIOPSY  08/21/2022   Procedure: BIOPSY;  Surgeon: Beverley Fiedler, MD;  Location: [facility] ENDOSCOPY;  Service: Gastroenterology;;   CRANIOTOMY Right 03/10/2023   Procedure: PARIETAL CRANIOTOMY;  Surgeon: Tressie Stalker, MD;  Location: [facility] OR;  Service: Neurosurgery;  Laterality: Right;   ESOPHAGOGASTRODUODENOSCOPY (EGD) WITH PROPOFOL N/A 08/21/2022   Procedure: ESOPHAGOGASTRODUODENOSCOPY (EGD) WITH PROPOFOL;  Surgeon: Beverley Fiedler, MD;  Location[facility] ENDOSCOPY;  Service: Gastroenterology;  Laterality: N/A;   IR IMAGING GUIDED PORT INSERTION  08/26/2022   LYMPHADENECTOMY Bilateral 09/01/2019   Procedure: LYMPHADENECTOMY;  Surgeon: Sebastian Ache, MD;  Location: [facility] ORS;  Service: Urology;  Laterality: Bilateral;   ROBOT ASSISTED LAPAROSCOPIC RADICAL PROSTATECTOMY N/A 09/01/2019   Procedure: XI ROBOTIC ASSISTED LAPAROSCOPIC RADICAL PROSTATECTOMY;  Surgeon: Sebastian Ache, MD;  Location: [facility] ORS;  Service: Urology;  Laterality: N/A;  3 HRS   HPI:  73 yo male [facility] on 2/10 with LUE and LE weakness, and change in mentation. Head CT of brain shows a recurrent tumor at the craniotomy site and increased  size of recurrent tumor along with a left midline shift up to 6 mm.  Hospital course complicated by concern for neck swelling, acute dysphagia, and concern for airway protection prompting intubation from 02/28-03/02. Neck CT on 04/26/23: demonstrated "No acute finding. Assessment of mucosal surfaces limited by airway collapse in the setting of intubation." MBS 3/3 showed an oral more than pharyngeal dysphagia although with silent aspiration x1 with thin liquids. Aspiration risk was felt to be mostly related to impulsivity/mentation, and he did progress from full liquid to pureed diet. Seizure activity increased though resulting in reintubation 3/8-3/14. Pt's swallowing was previously evaluated when he was admitted in June of 2024 - he was D/Cd on a dysphagia 3 diet with thin liquids with no f/u recommended. Pt's swallowing assessed again on 04/08/23 this admission with recommendation of a regular diet and thin liquids.  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    Assessment / Plan / Recommendation  Clinical Impression  Pt has audible secretions at baseline and saliva that is pooling in his oral cavity and spilling out of the R side of his mouth. He is very resistent to use of the yankauer, but did allow SLP to perform oral care with suction so secretions were also removed. He has limited awareness to bolus presentation, not initiating a swallow at all with ice chips and losing the majority of the bolus anterior with thin liquids after more passively accepting fluids. He  had a little coughing that sounded very weak and his cued cough is very weak but also inconsistently elicited. Recommend that he remain NPO for now with SLP f/u for potential repeat instrumental testing if signs of  dysphagia persist. SLP Visit Diagnosis: Dysphagia, oropharyngeal phase (R13.12)    Aspiration Risk       Diet Recommendation NPO;Alternative means - temporary    Medication Administration: Via alternative means    Other  Recommendations Oral Care Recommendations: Oral care QID Caregiver Recommendations: Have oral suction available    Recommendations for follow up therapy are one component of a multi-disciplinary discharge planning process, led by the attending physician.  Recommendations may be updated based on patient status, additional functional criteria and insurance authorization.  Follow up Recommendations Acute inpatient rehab (3hours/day)      Assistance Recommended at Discharge    Functional Status Assessment Patient has had a recent decline in their functional status and demonstrates the ability to make significant improvements in function in a reasonable and predictable amount of time.  Frequency and Duration min 2x/week  2 weeks       Prognosis Prognosis for improved oropharyngeal function: Fair Barriers to Reach Goals: Cognitive deficits      Swallow Study   General HPI: 73 yo male presents [facility] on 2/10 with LUE and LE weakness, and change in mentation. Head CT of brain shows a recurrent tumor at the craniotomy site and increased size of recurrent tumor along with a left midline shift up to 6 mm.  Hospital course complicated by concern for neck swelling, acute dysphagia, and concern for airway protection prompting intubation from 02/28-03/02. Neck CT on 04/26/23: demonstrated "No acute finding. Assessment of mucosal surfaces limited by airway collapse in the setting of intubation." MBS 3/3 showed an oral more than pharyngeal dysphagia although with silent aspiration x1 with thin liquids. Aspiration risk was felt to be mostly related to impulsivity/mentation, and he did progress from full liquid to pureed diet. Seizure activity increased though resulting in reintubation  3/8-3/14. Pt's swallowing was previously evaluated when he was admitted in June of 2024 - he was D/Cd on a dysphagia 3 diet with thin liquids with no f/u recommended. Pt's swallowing assessed again on 04/08/23 this admission with recommendation of a regular diet and thin liquids.  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 Type of Study: Bedside Swallow Evaluation Diet Prior to this Study: NPO;Cortrak/Small bore NG tube Temperature Spikes Noted: Yes Respiratory Status: Nasal cannula History of Recent Intubation: Yes Total duration of intubation (days): 9 days (across 2 intubations) Date extubated: 05/09/23 Behavior/Cognition: Alert;Requires cueing Oral Cavity Assessment: Excessive secretions Oral Care Completed by SLP: Yes Oral Cavity - Dentition: Adequate natural dentition Self-Feeding Abilities: Total assist Patient Positioning: Upright in bed Baseline Vocal Quality: Wet Volitional Cough: Weak Volitional Swallow: Unable to elicit    Oral/Motor/Sensory Function Overall Oral Motor/Sensory Function: Other (comment) (limtied ability to complete oral motor exam)   Ice Chips Ice chips: Impaired Presentation: Spoon Oral Phase Functional Implications: Oral holding;Right anterior spillage Pharyngeal Phase Impairments: Unable to trigger swallow   Thin Liquid Thin Liquid: Impaired Presentation: Spoon Oral Phase Impairments: Reduced labial seal;Poor awareness of bolus Oral Phase Functional Implications: Right anterior spillage Pharyngeal  Phase Impairments: Suspected delayed Swallow;Decreased hyoid-laryngeal movement;Cough - Delayed;Wet Vocal Quality  Nectar Thick Nectar Thick Liquid: Not tested   Honey Thick Honey Thick Liquid: Not tested   Puree Puree: Not tested    Solid     Solid: Not tested      Mahala Menghini., M.A. CCC-SLP Acute Rehabilitation Services Office [phone]  Secure chat preferred  05/11/2023,3:11 PM

## 2023-05-11 NOTE — Plan of Care (Signed)
  Problem: Education: Goal: Knowledge of General Education information will improve Description: Including pain rating scale, medication(s)/side effects and non-pharmacologic comfort measures Outcome: Progressing   Problem: Health Behavior/Discharge Planning: Goal: Ability to manage health-related needs will improve Outcome: Progressing   Problem: Clinical Measurements: Goal: Ability to maintain clinical measurements within normal limits will improve Outcome: Progressing Goal: Will remain free from infection Outcome: Progressing Goal: Diagnostic test results will improve Outcome: Progressing Goal: Respiratory complications will improve Outcome: Progressing Goal: Cardiovascular complication will be avoided Outcome: Progressing   Problem: Activity: Goal: Risk for activity intolerance will decrease Outcome: Progressing   Problem: Nutrition: Goal: Adequate nutrition will be maintained Outcome: Progressing   Problem: Coping: Goal: Level of anxiety will decrease Outcome: Progressing   Problem: Elimination: Goal: Will not experience complications related to bowel motility Outcome: Progressing Goal: Will not experience complications related to urinary retention Outcome: Progressing   Problem: Pain Managment: Goal: General experience of comfort will improve and/or be controlled Outcome: Progressing   Problem: Safety: Goal: Ability to remain free from injury will improve Outcome: Progressing   Problem: Skin Integrity: Goal: Risk for impaired skin integrity will decrease Outcome: Progressing   Problem: Education: Goal: Knowledge of the prescribed therapeutic regimen will improve Outcome: Progressing   Problem: Activity: Goal: Ability to implement measures to reduce episodes of fatigue will improve Outcome: Progressing   Problem: Bowel/Gastric: Goal: Will not experience complications related to bowel motility Outcome: Progressing   Problem: Coping: Goal: Ability to  identify and develop effective coping behavior will improve Outcome: Progressing   Problem: Nutritional: Goal: Maintenance of adequate nutrition will improve Outcome: Progressing   Problem: Education: Goal: Ability to describe self-care measures that may prevent or decrease complications (Diabetes Survival Skills Education) will improve Outcome: Progressing Goal: Individualized Educational Video(s) Outcome: Progressing   Problem: Coping: Goal: Ability to adjust to condition or change in health will improve Outcome: Progressing   Problem: Fluid Volume: Goal: Ability to maintain a balanced intake and output will improve Outcome: Progressing   Problem: Health Behavior/Discharge Planning: Goal: Ability to identify and utilize available resources and services will improve Outcome: Progressing Goal: Ability to manage health-related needs will improve Outcome: Progressing   Problem: Metabolic: Goal: Ability to maintain appropriate glucose levels will improve Outcome: Progressing   Problem: Nutritional: Goal: Maintenance of adequate nutrition will improve Outcome: Progressing Goal: Progress toward achieving an optimal weight will improve Outcome: Progressing   Problem: Skin Integrity: Goal: Risk for impaired skin integrity will decrease Outcome: Progressing   Problem: Tissue Perfusion: Goal: Adequacy of tissue perfusion will improve Outcome: Progressing   Problem: Activity: Goal: Ability to tolerate increased activity will improve Outcome: Progressing   Problem: Respiratory: Goal: Ability to maintain a clear airway and adequate ventilation will improve Outcome: Progressing

## 2023-05-12 DIAGNOSIS — Z7189 Other specified counseling: Secondary | ICD-10-CM | POA: Diagnosis not present

## 2023-05-12 DIAGNOSIS — C8339 Primary central nervous system lymphoma: Secondary | ICD-10-CM | POA: Diagnosis not present

## 2023-05-12 LAB — COMPREHENSIVE METABOLIC PANEL
ALT: 77 U/L — ABNORMAL HIGH (ref 0–44)
AST: 34 U/L (ref 15–41)
Albumin: 2.7 g/dL — ABNORMAL LOW (ref 3.5–5.0)
Alkaline Phosphatase: 66 U/L (ref 38–126)
Anion gap: 11 (ref 5–15)
BUN: 21 mg/dL (ref 8–23)
CO2: 27 mmol/L (ref 22–32)
Calcium: 8.6 mg/dL — ABNORMAL LOW (ref 8.9–10.3)
Chloride: 98 mmol/L (ref 98–111)
Creatinine, Ser: 0.71 mg/dL (ref 0.61–1.24)
GFR, Estimated: >60 mL/min (ref >60)
Glucose, Bld: 105 mg/dL — ABNORMAL HIGH (ref 70–99)
Potassium: 4.3 mmol/L (ref 3.5–5.1)
Sodium: 136 mmol/L (ref 135–145)
Total Bilirubin: 0.6 mg/dL (ref 0.0–1.2)
Total Protein: 5.6 g/dL — ABNORMAL LOW (ref 6.5–8.1)

## 2023-05-12 LAB — CBC WITH DIFFERENTIAL/PLATELET
Abs Immature Granulocytes: 0.2 10*3/uL — ABNORMAL HIGH (ref 0.00–0.07)
Basophils Absolute: 0 10*3/uL (ref 0.0–0.1)
Basophils Relative: 0 %
Eosinophils Absolute: 0 10*3/uL (ref 0.0–0.5)
Eosinophils Relative: 0 %
HCT: 36.5 % — ABNORMAL LOW (ref 39.0–52.0)
Hemoglobin: 12 g/dL — ABNORMAL LOW (ref 13.0–17.0)
Immature Granulocytes: 3 %
Lymphocytes Relative: 20 %
Lymphs Abs: 1.5 10*3/uL (ref 0.7–4.0)
MCH: 29.7 pg (ref 26.0–34.0)
MCHC: 32.9 g/dL (ref 30.0–36.0)
MCV: 90.3 fL (ref 80.0–100.0)
Monocytes Absolute: 0.9 10*3/uL (ref 0.1–1.0)
Monocytes Relative: 12 %
Neutro Abs: 5.1 10*3/uL (ref 1.7–7.7)
Neutrophils Relative %: 65 %
Platelets: 284 10*3/uL (ref 150–400)
RBC: 4.04 MIL/uL — ABNORMAL LOW (ref 4.22–5.81)
RDW: 13.3 % (ref 11.5–15.5)
WBC: 7.7 10*3/uL (ref 4.0–10.5)
nRBC: 0 % (ref 0.0–0.2)

## 2023-05-12 LAB — GLUCOSE, CAPILLARY
Glucose-Capillary: 122 mg/dL — ABNORMAL HIGH (ref 70–99)
Glucose-Capillary: 117 mg/dL — ABNORMAL HIGH (ref 70–99)
Glucose-Capillary: 85 mg/dL (ref 70–99)
Glucose-Capillary: 127 mg/dL — ABNORMAL HIGH (ref 70–99)
Glucose-Capillary: 114 mg/dL — ABNORMAL HIGH (ref 70–99)
Glucose-Capillary: 109 mg/dL — ABNORMAL HIGH (ref 70–99)

## 2023-05-12 NOTE — TOC Progression Note (Signed)
 Transition of Care [facility]) - Progression Note    Patient Details  Name: Tyler [name] MRN: [id number] Date of Birth: 1951/01/03  [facility] CM/SW Contact  Kermit Balo, RN Phone Number: 05/12/2023, 12:09 PM  Clinical Narrative:     Pt continues with Cortrak. Current recommendations are for CIR. TOC following.  Expected Discharge Plan: IP Rehab Facility Barriers to Discharge: Continued Medical Work up, Conservator, museum/gallery and Services In-house Referral: Clinical Social Work   Post Acute Care Choice: IP Rehab Living arrangements for the past 2 months: Single Family Home                                       Social Determinants of Health (SDOH) Interventions SDOH Screenings   Food Insecurity: No Food Insecurity (04/22/2023)  Housing: Low Risk  (04/22/2023)  Transportation Needs: No Transportation Needs (04/22/2023)  Utilities: Not At Risk (04/22/2023)  Financial Resource Strain: Not on File (06/14/2021)   Received from [place], [place]  Physical Activity: Not on File (06/14/2021)   Received from [place], [place]  Social Connections: Patient Declined (04/22/2023)  Recent Concern: Social Connections - Moderately Isolated (04/07/2023)  Stress: Not on File (06/14/2021)   Received from [place], [place]  Tobacco Use: Low Risk  (04/22/2023)    Readmission Risk Interventions    04/23/2023   12:41 PM 03/07/2023    2:25 PM 08/22/2022    3:06 PM  Readmission Risk Prevention Plan  Transportation Screening Complete Complete Complete  Home Care Screening  Complete   Medication Review (RN CM)  Referral to Pharmacy   Medication Review (RN Care Manager) Complete  Complete  PCP or Specialist appointment within 3-5 days of discharge Complete  Complete  HRI or Home Care Consult Complete  Complete  SW Recovery Care/Counseling Consult Complete  Complete  Palliative Care Screening Not Applicable  Complete  Skilled Nursing Facility Not Applicable  Complete

## 2023-05-12 NOTE — Progress Notes (Signed)
 Patient's  temp was 101.1 earlier and rechecked came down to 99.6 after Tylenol 650 mg .Notified Dr. Loney Loh and awaiting for response.

## 2023-05-12 NOTE — Progress Notes (Signed)
 Dr. Loney Loh responded saying [facility] will follow up on him today so I should let Critical care team know about it. Called PCCM and talked to [employer] and she said to call back if he spike temp again. Will continue to monitor.

## 2023-05-12 NOTE — Plan of Care (Signed)
  Problem: Education: Goal: Knowledge of General Education information will improve Description: Including pain rating scale, medication(s)/side effects and non-pharmacologic comfort measures Outcome: Not Progressing   Problem: Health Behavior/Discharge Planning: Goal: Ability to manage health-related needs will improve Outcome: Not Progressing   Problem: Clinical Measurements: Goal: Ability to maintain clinical measurements within normal limits will improve Outcome: Progressing Goal: Will remain free from infection Outcome: Progressing Goal: Diagnostic test results will improve Outcome: Progressing Goal: Respiratory complications will improve Outcome: Progressing Goal: Cardiovascular complication will be avoided Outcome: Progressing   Problem: Activity: Goal: Risk for activity intolerance will decrease Outcome: Not Progressing   Problem: Nutrition: Goal: Adequate nutrition will be maintained Outcome: Progressing   Problem: Coping: Goal: Level of anxiety will decrease Outcome: Progressing   Problem: Elimination: Goal: Will not experience complications related to bowel motility Outcome: Progressing Goal: Will not experience complications related to urinary retention Outcome: Progressing   Problem: Pain Managment: Goal: General experience of comfort will improve and/or be controlled Outcome: Progressing   Problem: Safety: Goal: Ability to remain free from injury will improve Outcome: Progressing   Problem: Skin Integrity: Goal: Risk for impaired skin integrity will decrease Outcome: Progressing   Problem: Education: Goal: Knowledge of the prescribed therapeutic regimen will improve Outcome: Not Progressing   Problem: Activity: Goal: Ability to implement measures to reduce episodes of fatigue will improve Outcome: Progressing   Problem: Bowel/Gastric: Goal: Will not experience complications related to bowel motility Outcome: Progressing   Problem:  Coping: Goal: Ability to identify and develop effective coping behavior will improve Outcome: Not Progressing   Problem: Nutritional: Goal: Maintenance of adequate nutrition will improve Outcome: Progressing Note: Pt receiving tube feeds.   Problem: Education: Goal: Ability to describe self-care measures that may prevent or decrease complications (Diabetes Survival Skills Education) will improve Outcome: Not Progressing Goal: Individualized Educational Video(s) Outcome: Not Applicable   Problem: Coping: Goal: Ability to adjust to condition or change in health will improve Outcome: Progressing   Problem: Fluid Volume: Goal: Ability to maintain a balanced intake and output will improve Outcome: Progressing   Problem: Health Behavior/Discharge Planning: Goal: Ability to identify and utilize available resources and services will improve Outcome: Not Progressing Goal: Ability to manage health-related needs will improve Outcome: Not Progressing   Problem: Metabolic: Goal: Ability to maintain appropriate glucose levels will improve Outcome: Progressing   Problem: Nutritional: Goal: Maintenance of adequate nutrition will improve Outcome: Progressing Goal: Progress toward achieving an optimal weight will improve Outcome: Progressing   Problem: Skin Integrity: Goal: Risk for impaired skin integrity will decrease Outcome: Progressing   Problem: Tissue Perfusion: Goal: Adequacy of tissue perfusion will improve Outcome: Progressing   Problem: Activity: Goal: Ability to tolerate increased activity will improve Outcome: Progressing   Problem: Respiratory: Goal: Ability to maintain a clear airway and adequate ventilation will improve Outcome: Progressing   Problem: Role Relationship: Goal: Method of communication will improve Outcome: Progressing Note: Patient nods appropriately

## 2023-05-12 NOTE — Progress Notes (Signed)
 Speech Language Pathology Treatment: Dysphagia  Patient Details Name: Tyler [name] MRN: [id number] DOB: Oct 11, 1950 Today's Date: 05/12/2023 Time: 6213-0865 SLP Time Calculation (min) (ACUTE ONLY): 13 min  Assessment / Plan / Recommendation Clinical Impression  Pt is resistant to oral care and use of yankauer, but is orally holding secretions at baseline, some of which spills out of the R side of his mouth. He does not use a straw in that he does not open his mouth to let it into his oral cavity, but when offering liquids other ways he has significant anterior loss. No swallow is observed but question how much remains in his oral cavity. Pt is most interested in applesauce and will open his oral cavity to accept spoonfuls of this, but he has oral holding and oral residue that appears to be the majority of the bolus still in his mouth. Given his resistance to the yankauer, it makes it challenging to clear this residue from his mouth and get good visualization to know that it has cleared. Pt is not yet ready for a diet, but current presentation would also make adequate nutrition a concern. Will continue to follow and advance as able.   HPI HPI: 73 yo male presents to [facility] on 2/10 with LUE and LE weakness, and change in mentation. Head CT of brain shows a recurrent tumor at the craniotomy site and increased size of recurrent tumor along with a left midline shift up to 6 mm.  Hospital course complicated by concern for neck swelling, acute dysphagia, and concern for airway protection prompting intubation from 02/28-03/02. Neck CT on 04/26/23: demonstrated "No acute finding. Assessment of mucosal surfaces limited by airway collapse in the setting of intubation." MBS 3/3 showed an oral more than pharyngeal dysphagia although with silent aspiration x1 with thin liquids. Aspiration risk was felt to be mostly related to impulsivity/mentation, and he did progress from full liquid to pureed diet. Seizure activity  increased though resulting in reintubation 3/8-3/14. Pt's swallowing was previously evaluated when he was admitted in June of 2024 - he was D/Cd on a dysphagia 3 diet with thin liquids with no f/u recommended. Pt's swallowing assessed again on 04/08/23 this admission with recommendation of a regular diet and thin liquids.  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      SLP Plan  Continue with current plan of care      Recommendations for follow up therapy are one component of a multi-disciplinary discharge planning process, led by the attending physician.  Recommendations may be updated based on patient status, additional functional criteria and insurance authorization.    Recommendations  Diet recommendations: NPO Medication Administration: Via alternative means                  Oral care QID   Frequent or constant Supervision/Assistance Dysphagia, oropharyngeal phase (R13.12)     Continue with current plan of care     Mahala Menghini., M.A. CCC-SLP Acute Rehabilitation Services Office [phone]  Secure chat preferred   05/12/2023, 3:26 PM

## 2023-05-12 NOTE — Progress Notes (Cosign Needed)
 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  Had shown significant improvements.     - On 04/25/2023 pt seen with likely partial seizure.  Rapid response was called and appropriate medications administered and patient intubated. - Has been on video EEG monitoring for seizures.   - On antiseizure meds and steroids.    Continue seizure precautions. - MRI brain 04/24/2023 in the AM showed marked interval decrease in right parietal mass persistent with response to therapy.  However new diffusion signal abnormality involving right insular cortex and overlying right frontal favored to reflect sequelae of seizure. - MRI brain done 05/06/2023 shows resolving diffusion abnormality within posterior right MCA territory.  Compared to scan of 04/07/2023 area of abnormal signal has not decreased.   - If seizure control improves with improvement of his tumors, can restart additional 2 HD Methotrexate treatments.   - If his functional status does not allow additional HD MTX, we would consult rad onc to control the tumor with RT.   - Family meeting held on 05/01/2023.  Family opted to continue with seizure control as outlined by Neuro Dr. Melynda Ripple and continue with next cycle of chemotherapy as available. - Extubated 05/09/2023, was intubated on 05/03/2023 due to respiratory failure from ongoing seizures. -continue Dexamethasone 4mg  BID at this time  - Seizures now controlled per neuro who signed off over the weekend.  Continue seizure meds as ordered by neuro. - Medical Oncology/Dr. Candise Che following closely.  Dysphagia -Extubated 05/09/2023 - Continue tube feedings as ordered - Patient does not appear to be capable of swallowing saliva.  He allowed writer to suction oral cavity. - OT continues to follow - Monitor closely    Concern for stroke - Neuro had concern for ischemic stroke in addition to seizures.  Stroke workup completed.  - CT angio head and neck done 05/08/2023, shows no emergent large vessel occlusion.  No intracranial hemorrhage. -Patient with significant left-sided weakness noted. 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   S/p chemotherapy R-[facility].  S/p craniotomy with resection 03/10/23.  - CT head 04/07/2023 showed interval increase in size of tumor at the craniotomy site.  MRI head 04/07/2023 increase in size of tumor with slightly increased surrounding edema. - S/P C2 Hi-dose methotrexate chemo regimen. Completed 04/23/2023. - S/P daily monitoring of Urine pH and methotrexate levels. S/P IV leucovorin rescue. - Cycle 3 was due 05/07/2023.  Consideration for chemotherapy this week. - IV Rituxan on hold until acute phase resolution: Previously planned between 2nd and 3rd cycle of methotrexate and subsequently between treatment cycles. -Previous consideration for brain radiation for local control.  - Continue daily labs - Due to current clinical status, need to initiate goals of care discussion with family - Medical oncology/Dr. Candise Che following closely.     Anemia, Leukopenia - Mild - Hemoglobin stable 11.8 - WBC improved 7.4 - No intervention required at this time - Monitor CBC with differential    Elevated blood glucose  - Monitor blood sugar levels closely.   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  Per pharmacy this may react with seizure meds patient is currently  taking, okay to change to Entecavir, he is checking on cost once patient is discharged. - Plan is to continue for 6 months after his last chemotherapy. - Follow ID outpatient   VTE prophylaxis - On Lovenox 40 mg injection daily   Fever - Temperature 99 today - May be related to seizure, meds, infection. - Management per medicine - Monitor fever curve      Code Status Full  Subjective:  Patient seen awake and alert laying in bed.  Noted oral secretions draining out right side of his face, he allowed me to suction oral cavity while he held my hand.  He responded verbally although speech is not comprehensible.  He does respond with nodding or shaking his head side-to-side for yes and no.  Also noted no movement left upper and left lower extremity.  He does move right upper extremity and right lower extremity.  No acute distress is noted.   Objective:  Vitals:   05/12/23 0831 05/12/23 1122  BP: (!) 139/95 125/77  Pulse: 82 90  Resp: 17 20  Temp: 99.2 F (37.3 C) 99.3 F (37.4 C)  SpO2: 100% 98%     Intake/Output Summary (Last 24 hours) at 05/12/2023 1220 Last data filed at 05/12/2023 1034 Gross per 24 hour  Intake 1518.32 ml  Output 1750 ml  Net -231.68 ml     REVIEW OF SYSTEMS: Unable to fully obtain  PHYSICAL EXAMINATION: ECOG PERFORMANCE STATUS: 4 - Bedbound  Vitals:   05/12/23 0831 05/12/23 1122  BP: (!) 139/95 125/77  Pulse: 82 90  Resp: 17 20  Temp: 99.2 F (37.3 C) 99.3 F (37.4 C)  SpO2: 100% 98%   Filed Weights   05/10/23 0500 05/11/23 0500 05/12/23 0500  Weight: 189 lb 9.5 oz (86 kg) 189 lb 9.5 oz (86 kg) 192 lb 3.9 oz (87.2 kg)    GENERAL: alert, + left-sided weakness SKIN: skin color, texture, turgor are normal, no rashes or significant lesions EYES: normal, conjunctiva are pink and non-injected, sclera clear OROPHARYNX: + Increased oral secretions NECK: supple, thyroid normal size, non-tender, without nodularity LYMPH: no palpable  lymphadenopathy in the cervical, axillary or inguinal LUNGS: clear to auscultation and percussion with normal breathing effort HEART: regular rate & rhythm and no murmurs and no lower extremity edema ABDOMEN: abdomen soft, non-tender and normal bowel sounds MUSCULOSKELETAL: no cyanosis of digits and no clubbing  PSYCH: + Limited verbal response NEURO: no focal motor/sensory deficits   All questions were answered. The patient knows to call the clinic with any problems, questions or concerns.   The total time spent in the appointment was 40 minutes encounter with patient including review of chart and various tests results, discussions about plan of care and coordination of care plan  Dawson Bills, NP 05/12/2023 12:20 PM    Labs Reviewed:  Lab Results  Component Value Date   WBC 7.7 05/12/2023   HGB 12.0 (L) 05/12/2023   HCT 36.5 (L) 05/12/2023   MCV 90.3 05/12/2023   PLT 284 05/12/2023   Recent Labs    08/11/22 0316 08/11/22 1544 08/12/22 0515 08/12/22 1400 08/20/22 0302 08/21/22 0334 05/08/23 0440 05/09/23 0407 05/11/23 0500 05/12/23 1017  NA 134*   < > 137   < > 137   < > 130* 130* 134* 136  K 3.7   < > 3.3*   < > 4.0   < > 4.4 4.2 4.2 4.3  CL 99   < > 101   < > 102   < > 99 98 98 98  CO2 25   < > 28   < > 24   < > 25 24 29 27   GLUCOSE 185*   < > 114*   < > 78   < > 185* 172* 129* 105*  BUN 52*   < > 41*   < > 45*   < > 25* 28* 22 21  CREATININE 1.58*   < > 1.44*   < > 3.38*   < > 0.93 0.87 0.73 0.71  CALCIUM 7.4*   < > 7.5*   < > 7.2*   < > 8.1* 8.0* 8.4* 8.6*  GFRNONAA 47*   < > 52*   < > 19*   < > >60 >60 >60 >60  PROT 4.6*  --  4.2*  --  3.9*   < > 5.4* 5.2*  --  5.6*  ALBUMIN 1.8*  1.7*   < > 1.7*  1.6*   < > 1.7*   < > 2.2* 2.1*  --  2.7*  AST 33  --  31  --  25   < > 23 29  --  34  ALT 31  --  31  --  18   < > 38 43  --  77*  ALKPHOS 84  --  79  --  111   < > 52 56  --  66  BILITOT 3.2*  --  2.5*  --  0.8   < > 0.4 0.4  --  0.6  BILIDIR 1.7*  --  1.3*  --   0.2  --   --   --   --   --   IBILI 1.5*  --  1.2*  --  0.6  --   --   --   --   --    < > = values in this interval not displayed.    Studies Reviewed:  CT ANGIO HEAD NECK W WO CM Result Date: 05/08/2023 CLINICAL DATA:  Stroke follow-up EXAM: CT ANGIOGRAPHY HEAD AND NECK WITH AND WITHOUT CONTRAST TECHNIQUE: Multidetector CT imaging of the head and neck was performed using the standard protocol during bolus administration of intravenous contrast. Multiplanar CT image reconstructions and MIPs were obtained to evaluate the vascular anatomy. Carotid stenosis measurements (when applicable) are obtained utilizing NASCET criteria, using the distal internal carotid diameter as the denominator. RADIATION DOSE REDUCTION: This exam was performed according to the departmental dose-optimization program which includes automated exposure control, adjustment of the mA and/or kV according to patient size and/or use of iterative reconstruction technique. CONTRAST:  75mL OMNIPAQUE IOHEXOL 350 MG/ML SOLN COMPARISON:  05/06/2023 FINDINGS: CT HEAD FINDINGS Brain: Hypoattenuation throughout much of the right hemisphere, corresponding to the area of hyperintense T2-weighted signal on recent MRI. No intracranial hemorrhage. Vascular: No hyperdense vessel or unexpected vascular calcification. Skull: Remote right posterior craniotomy. Sinuses/Orbits: Bilateral maxillary and ethmoid sinus mucosal thickening. CTA NECK FINDINGS Skeleton: No acute abnormality or high grade bony spinal canal stenosis. Other neck: Normal pharynx, larynx and major salivary glands. No cervical lymphadenopathy. Unremarkable thyroid gland. Upper chest: No pneumothorax or pleural effusion. No nodules or masses.  Aortic arch: There is no calcific atherosclerosis of the aortic arch. Conventional 3 vessel aortic branching pattern. RIGHT carotid system: Normal without aneurysm, dissection or stenosis. LEFT carotid system: Normal without aneurysm, dissection or  stenosis. Vertebral arteries: Left dominant configuration. There is no dissection, occlusion or flow-limiting stenosis to the skull base (V1-V3 segments). CTA HEAD FINDINGS POSTERIOR CIRCULATION: Vertebral arteries are normal. No proximal occlusion of the anterior or inferior cerebellar arteries. Basilar artery is normal. Superior cerebellar arteries are normal. Posterior cerebral arteries are normal. ANTERIOR CIRCULATION: Intracranial internal carotid arteries are normal. Anterior cerebral arteries are normal. Middle cerebral arteries are normal. Venous sinuses: As permitted by contrast timing, patent. Anatomic variants: None Review of the MIP images confirms the above findings. IMPRESSION: 1. No emergent large vessel occlusion or high-grade stenosis of the intracranial arteries. 2. Hypoattenuation throughout much of the right hemisphere, corresponding to the area of hyperintense T2-weighted signal on recent MRI. No intracranial hemorrhage. Electronically Signed   By: Deatra Robinson M.D.   On: 05/08/2023 20:07   DG Chest Port 1 View Result Date: 05/08/2023 CLINICAL DATA:  Acute respiratory failure. EXAM: PORTABLE CHEST 1 VIEW COMPARISON:  May 06, 2023. FINDINGS: Stable cardiomegaly. Endotracheal and feeding tubes are unchanged. Right internal jugular catheter is unchanged. Right lung is clear. Minimal left basilar subsegmental atelectasis is noted. IMPRESSION: Stable support apparatus. Minimal left basilar subsegmental atelectasis. Electronically Signed   By: Lupita Raider M.D.   On: 05/08/2023 09:39   VAS [place] LOWER EXTREMITY VENOUS (DVT) Result Date: 05/07/2023  Lower Venous DVT Study Patient Name:  Tyler [name]  Date of Exam:   05/06/2023 Medical Rec #: [id number]     Accession #:    [id number] Date of Birth: 10-28-1950     Patient Gender: M Patient Age:   50 years Exam Location:  [facility] Procedure:      VAS [place] LOWER EXTREMITY VENOUS (DVT) Referring Phys: Chilton Greathouse  --------------------------------------------------------------------------------  Indications: Swelling, and Edema. Other Indications: A-fib, H/O P.E. Comparison Study: Previous study on 7.12.2021. Performing Technologist: Fernande Bras  Examination Guidelines: A complete evaluation includes B-mode imaging, spectral Doppler, color Doppler, and power Doppler as needed of all accessible portions of each vessel. Bilateral testing is considered an integral part of a complete examination. Limited examinations for reoccurring indications may be performed as noted. The reflux portion of the exam is performed with the patient in reverse Trendelenburg.  +---------+---------------+---------+-----------+----------+--------------+ RIGHT    CompressibilityPhasicitySpontaneityPropertiesThrombus Aging +---------+---------------+---------+-----------+----------+--------------+ CFV      Full           Yes      Yes                                 +---------+---------------+---------+-----------+----------+--------------+ SFJ      Full           Yes      Yes                                 +---------+---------------+---------+-----------+----------+--------------+ FV Prox  Full                                                        +---------+---------------+---------+-----------+----------+--------------+ FV  Mid   Full                                                        +---------+---------------+---------+-----------+----------+--------------+ FV DistalFull                                                        +---------+---------------+---------+-----------+----------+--------------+ PFV      Full                                                        +---------+---------------+---------+-----------+----------+--------------+ POP      Full           Yes      Yes                                  +---------+---------------+---------+-----------+----------+--------------+ PTV      Full                                                        +---------+---------------+---------+-----------+----------+--------------+ PERO     Full                                                        +---------+---------------+---------+-----------+----------+--------------+   +---------+---------------+---------+-----------+----------+--------------+ LEFT     CompressibilityPhasicitySpontaneityPropertiesThrombus Aging +---------+---------------+---------+-----------+----------+--------------+ CFV      Full           Yes      Yes                                 +---------+---------------+---------+-----------+----------+--------------+ SFJ      Full           Yes      Yes                                 +---------+---------------+---------+-----------+----------+--------------+ FV Prox  Full                                                        +---------+---------------+---------+-----------+----------+--------------+ FV Mid   Full                                                        +---------+---------------+---------+-----------+----------+--------------+  FV DistalFull                                                        +---------+---------------+---------+-----------+----------+--------------+ PFV      Full                                                        +---------+---------------+---------+-----------+----------+--------------+ POP      Full           Yes      Yes                                 +---------+---------------+---------+-----------+----------+--------------+ PTV      Full                                                        +---------+---------------+---------+-----------+----------+--------------+ PERO     Full                                                         +---------+---------------+---------+-----------+----------+--------------+     Summary: BILATERAL: - No evidence of deep vein thrombosis seen in the lower extremities, bilaterally. -No evidence of popliteal cyst, bilaterally.   *See table(s) above for measurements and observations. Electronically signed by Coral Else MD on 05/07/2023 at 8:38:36 PM.    Final    MR BRAIN WO CONTRAST Result Date: 05/07/2023 CLINICAL DATA:  Stroke follow-up EXAM: MRI HEAD WITHOUT CONTRAST TECHNIQUE: Multiplanar, multiecho pulse sequences of the brain and surrounding structures were obtained without intravenous contrast. COMPARISON:  04/24/2023, 04/07/2023 FINDINGS: Brain: Resolving diffusion abnormality within the posterior right MCA territory. No acute infarct. Unchanged appearance of intraparenchymal hemorrhage in the posterior right parietal lobe. Confluent hyperintense T2-weighted signal within the posterior right hemisphere now extends more anteriorly with greater involvement of the anterior right temporal lobe, right insula and frontal operculum. There is multifocal hyperintense T2-weighted signal within the white matter. Generalized volume loss. The midline structures are normal. Vascular: Normal flow voids. Skull and upper cervical spine: Normal calvarium and skull base. Visualized upper cervical spine and soft tissues are normal. Sinuses/Orbits:Moderate paranasal sinus mucosal thickening. Normal orbits. IMPRESSION: 1. Resolving diffusion abnormality within the posterior right MCA territory. 2. Increased extent of hyperintense T2-weighted signal in the right hemisphere. This could indicate progression of lymphoma, or confluence of post-ischemic/post-ictal edema cytotoxic edema and lymphoma-related vasogenic edema. Compared to 04/07/2023, however, the area of abnormal signal has decreased. 3. Unchanged appearance of intraparenchymal hemorrhage in the posterior right parietal lobe. Electronically Signed   By: Deatra Robinson  M.D.   On: 05/07/2023 00:52   CT HEAD WO CONTRAST ( ) Addendum Date: 05/06/2023 ADDENDUM REPORT: 05/06/2023 14:37 ADDENDUM: Study discussed by telephone with Dr. Lindie Spruce on 05/06/2023 at 1431 hours. Electronically Signed  By: Odessa Fleming M.D.   On: 05/06/2023 14:37   Result Date: 05/06/2023 CLINICAL DATA:  73 year old male with seizure. Right side weakness. Intubated. History of treated CNS lymphoma. EXAM: CT HEAD WITHOUT CONTRAST TECHNIQUE: Contiguous axial images were obtained from the base of the skull through the vertex without intravenous contrast. RADIATION DOSE REDUCTION: This exam was performed according to the departmental dose-optimization program which includes automated exposure control, adjustment of the mA and/or kV according to patient size and/or use of iterative reconstruction technique. COMPARISON:  Head CT 04/26/2023 and earlier. FINDINGS: Brain: Confluent cytotoxic edema now in the right MCA territory, including all of the insula, operculum, most of the posterior right MCA territory. No hemorrhagic transformation. No significant midline shift or intracranial mass effect. No ventriculomegaly. No acute intracranial hemorrhage identified. Superimposed chronic postoperative, post treatment changes to the posterior right parietal lobe with dystrophic calcification there, stable. Stable since 04/26/2023 gray-white differentiation elsewhere. Basilar cisterns are patent. Vascular: Calcified atherosclerosis at the skull base. No suspicious intracranial vascular hyperdensity. Skull: Stable appearance of right posterior parietal craniotomy. Sinuses/Orbits: Right nasoenteric tube remains in place. Increased sinus mucosal thickening and opacification from earlier this month. Tympanic cavities and mastoids remain well aerated. Other: EEG leads in place. No acute orbit or scalp soft tissue finding. IMPRESSION: 1. 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 No hemorrhagic transformation. No significant intracranial mass effect. 2. Otherwise stable post treatment appearance of the brain, right parietal tumor resection. Electronically Signed: By: Odessa Fleming M.D. On: 05/06/2023 14:16   DG Chest Port 1 View Result Date: 05/06/2023 CLINICAL DATA:  Acute respiratory failure. EXAM: PORTABLE CHEST 1 VIEW COMPARISON:  Radiograph 05/03/2023 FINDINGS: Endotracheal tube tip 5.7 cm from the carina. Weighted enteric tube in place, tip not included in the field of view. Accessed right chest port in place. Stable heart size and mediastinal contours. No confluent airspace disease, large pleural effusion or pneumothorax. No pulmonary edema. Left second rib fracture. IMPRESSION: 1. Endotracheal tube tip 5.7 cm from the carina. 2. Weighted enteric tube in place, tip not included in the field of view. 3. No acute findings. Electronically Signed   By: Narda Rutherford M.D.   On: 05/06/2023 11:01   NM PET Image Restag (PS) Skull Base To Thigh Result Date: 05/05/2023 CLINICAL DATA:  Subsequent treatment strategy for large B-cell lymphoma. EXAM: NUCLEAR MEDICINE PET SKULL BASE TO THIGH TECHNIQUE: 9.1 mCi F-18 FDG was injected intravenously. Full-ring PET imaging was performed from the skull base to thigh after the radiotracer. CT data was obtained and used for attenuation correction and anatomic localization. Fasting blood glucose: 98 mg/dl COMPARISON:  CT abdomen pelvis 03/07/2023 and PET 12/27/2022. FINDINGS: Mediastinal blood pool activity: SUV max 2.5 Liver activity: SUV max 4.1 NECK: Focal hypermetabolism along the high left mandible or deep left parotid gland, SUV max 5.3, unchanged. No definite CT correlate. Right level III lymph node measures 7 mm (4/39), SUV max 2.0, minimally decreased from 3.7. Incidental CT findings: None. CHEST: Axillary lymph nodes have decreased in hypermetabolism.  Index 8 mm left axillary lymph node (4/54), SUV max 1.3, previously 2.7. No new abnormal hypermetabolism. Incidental CT findings: Right IJ Port-A-Cath terminates in the right atrium. Heart is enlarged. No pericardial or pleural effusion. ABDOMEN/PELVIS: No abnormal hypermetabolism. Incidental CT findings: Probable tiny left hepatic lobe cyst. No specific follow-up necessary. Low-attenuation lesions in the kidneys. No  specific follow-up necessary. Soft tissue thickening and stranding along the posterior retroperitoneum bilaterally, new in the interval but nonspecific. Left inguinal hernia repair. SKELETON: 1.5 cm lucent lesion posterior to the left acetabulum (4/175), SUV max 2.5, unchanged. No additional abnormal hypermetabolism. Incidental CT findings: Degenerative changes in the spine. IMPRESSION: 1. Interval response to therapy as evidenced by decrease in hypermetabolism involving cervical and axillary lymph nodes ([place] 2). 2. Small lucent lesion posterior to the left acetabulum with associated borderline hypermetabolism, unchanged and nonspecific. Recommend attention on follow-up. 3. Minimal residual atelectasis or scarring in the posteromedial right lower lobe, improved from 12/27/2022. Electronically Signed   By: Leanna Battles M.D.   On: 05/05/2023 10:31   DG CHEST PORT 1 VIEW Result Date: 05/03/2023 CLINICAL DATA:  73 year old male intubated. EXAM: PORTABLE CHEST 1 VIEW COMPARISON:  Portable chest yesterday. FINDINGS: Portable AP upright view at 0923 hours. Mildly rotated to the right. Endotracheal tube tip in good position between the clavicles and carina. Enteric feeding tube courses to the left abdomen, tip not included. Stable right chest power port. Stable cardiac size and mediastinal contours. Stable lung volumes and ventilation. No pneumothorax, pulmonary edema, consolidation. Negative visible bowel gas. IMPRESSION: 1. Satisfactory endotracheal tube placement. Enteric feeding tube remains in  place. 2.  No convincing acute cardiopulmonary abnormality. Electronically Signed   By: Odessa Fleming M.D.   On: 05/03/2023 12:57   DG Chest Port 1 View Result Date: 05/02/2023 CLINICAL DATA:  Shortness of breath.  History of lymphoma. EXAM: PORTABLE CHEST 1 VIEW COMPARISON:  Radiograph yesterday.  Chest CT 04/26/2023 FINDINGS: Right chest port in place. Weighted enteric tube tip below the diaphragm. Lung volumes are low. Stable heart size and mediastinal contours. Bibasilar atelectasis, left greater than right. No new airspace disease. No pleural fluid or pneumothorax. No pulmonary edema. IMPRESSION: Hypoventilatory chest with bibasilar atelectasis. Electronically Signed   By: Narda Rutherford M.D.   On: 05/02/2023 21:50   DG CHEST PORT 1 VIEW Result Date: 05/01/2023 CLINICAL DATA:  Fever EXAM: PORTABLE CHEST 1 VIEW COMPARISON:  04/27/2023 FINDINGS: Cardiomegaly. Right Port-A-Cath remains in place, unchanged. No confluent airspace opacities or effusions. No acute bony abnormality. IMPRESSION: No active disease. Electronically Signed   By: Charlett Nose M.D.   On: 05/01/2023 12:23   DG Swallowing Func-Speech Pathology Result Date: 04/28/2023 Table formatting from the original result was not included. Modified Barium Swallow Study Patient Details Name: Tyler [name] MRN: [id number] Date of Birth: 1950/10/01 Today's Date: 04/28/2023 HPI/PMH: HPI: 73 yo male presents to [facility] on 2/10 with LUE and LE weakness, and change in mentation. Head CT of brain shows a recurrent tumor at the craniotomy site and increased size of recurrent tumor along with a left midline shift up to 6 mm. 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 Pt's swallowing was evaluated when he was admitted in June of 2024 - he was D/Cd on a  dysphagia 3 diet with thin liquids with no f/u recommended. Pt's swallowing assessed again on 04/08/23 this admission with recommendation of a regular diet and thin liquids. Hospital course complicated by concern for neck swelling, acute dysphagia, and concern for airway protection prompting intubation from 02/28-03/02. Neck CT on 04/26/23: demonstrated "No acute finding. Assessment of mucosal surfaces limited by airway  collapse in the setting of intubation.". Clinical  Impression: Pt presents with a moderate-severe oral dysphagia and a mild pharyngeal dysphagia per results of MBSS completed today. Oral deficits included poor bolus awareness, oral holding, reduced labial seal resulting in anterior spillage, poor lingual movement or initiation for oral transit, and reduced oral coordination resulting in posterior loss of trials. Oral deficits were more pronounced with nectar-thick liquid, puree, and soft solid trials. Soft solid trial was removed from oral cavity due to minimal-no mastication efforts.  Pt required multiple verbal and tactile cues to eventually initiate oral transit of nectar-thick and puree trials. Tactile cues included downward pressure of spoon on tongue multiple times. Pharyngeal dysphagia characterized by reduced hyolaryngeal elevation, brief mistiming of pharyngeal swallow initiation, reduced base of tongue retraction, reduced laryngeal vestibule closure, and reduced pharyngeal stripping. Findings: -There was x1 instance of trace-min silent aspiration of thin liquids (via straw) before the swallow. Aspiration occurred posteriorly from the pyriform sinuses due to pooling of liquids before swallow was initiated. Aspiration event was completely silent without cough, water eyes, increased RR, or any other subtle s/s of aspiration. -There was intermittent, trace, shallow, transient penetration of nectar-thick and thin liquids by cup. -There was shallow penetration without ejection of mixture of thin and  nectar-thick residue towards end of study. Penetrated residue appeared to move slowly towards the airway, though remained above the vocal cords during assessment. Pt unable to follow directions for strong cough to attempt to clear residue. Diet recommendations outlined below. SLP will follow up for ongoing diet tolerance assessment and therapeutic PO trials of pureed solids to address oral deficits. Recommend continue tube feeds via Cortrak for nutritional support and medication delivery. Factors that may increase risk of adverse event in presence of aspiration Rubye Oaks & Clearance Coots 2021): Factors that may increase risk of adverse event in presence of aspiration Rubye Oaks & Clearance Coots 2021): Reduced cognitive function; Presence of tubes (ETT, trach, NG, etc.); Weak cough; Dependence for feeding and/or oral hygiene; Inadequate oral hygiene Recommendations/Plan: Swallowing Evaluation Recommendations Swallowing Evaluation Recommendations Recommendations: PO diet PO Diet Recommendation: Clear liquid diet; Thin liquids (Level 0) (ok for any thin liquids) Liquid Administration via: Cup; No straw Medication Administration: Via alternative means Supervision: Staff to assist with self-feeding; Full supervision/cueing for swallowing strategies Swallowing strategies  : Minimize environmental distractions; Small bites/sips; Check for anterior loss Postural changes: Position pt fully upright for meals; Stay upright 30-60 min after meals Oral care recommendations: Oral care QID (4x/day); Staff/trained caregiver to provide oral care; Use suctioning for oral care Caregiver Recommendations: Have oral suction available Treatment Plan Treatment Plan Treatment recommendations: Therapy as outlined in treatment plan below Follow-up recommendations: Follow physicians's recommendations for discharge plan and follow up therapies (SLP services at next level of care) Functional status assessment: Patient has had a recent decline in their functional  status and demonstrates the ability to make significant improvements in function in a reasonable and predictable amount of time. Treatment frequency: Min 2x/week Treatment duration: 2 weeks Interventions: Aspiration precaution training; Compensatory techniques; Patient/family education; Trials of upgraded texture/liquids; Diet toleration management by SLP; Oropharyngeal exercises Recommendations Recommendations for follow up therapy are one component of a multi-disciplinary discharge planning process, led by the attending physician.  Recommendations may be updated based on patient status, additional functional criteria and insurance authorization. Assessment: Orofacial Exam: Orofacial Exam Oral Cavity: Oral Hygiene: WFL Oral Cavity - Dentition: Adequate natural dentition Orofacial Anatomy: WFL Oral Motor/Sensory Function: Suspected cranial nerve impairment (limited assessment, though concern for oral apraxia) Anatomy: Anatomy: WFL Boluses Administered: Boluses Administered  Boluses Administered: Mildly thick liquids (Level 2, nectar thick); Thin liquids (Level 0); Puree; Solid  Oral Impairment Domain: Oral Impairment Domain Lip Closure: Escape beyond mid-chin Tongue control during bolus hold: Posterior escape of greater than half of bolus Bolus preparation/mastication: Minimal chewing/mashing with majority of bolus unchewed (removed from oral cavity) Bolus transport/lingual motion: Minimal-no tongue motion Oral residue: Majority of bolus remaining Location of oral residue : Floor of mouth; Tongue Initiation of pharyngeal swallow : Pyriform sinuses  Pharyngeal Impairment Domain: Pharyngeal Impairment Domain Soft palate elevation: No bolus between soft palate (SP)/pharyngeal wall (PW) Laryngeal elevation: Partial superior movement of thyroid cartilage/partial approximation of arytenoids to epiglottic petiole Anterior hyoid excursion: Complete anterior movement Epiglottic movement: Complete inversion Laryngeal vestibule  closure: Incomplete, narrow column air/contrast in laryngeal vestibule Pharyngeal stripping wave : Present - diminished Pharyngeal contraction (A/P view only): N/A Pharyngoesophageal segment opening: Complete distension and complete duration, no obstruction of flow Tongue base retraction: Trace column of contrast or air between tongue base and PPW Pharyngeal residue: Collection of residue within or on pharyngeal structures Location of pharyngeal residue: Tongue base; Valleculae  Esophageal Impairment Domain: No data recorded Pill: No data recorded Penetration/Aspiration Scale Score: Penetration/Aspiration Scale Score 1.  Material does not enter airway: Thin liquids (Level 0); Mildly thick liquids (Level 2, nectar thick); Puree 2.  Material enters airway, remains ABOVE vocal cords then ejected out: Thin liquids (Level 0); Mildly thick liquids (Level 2, nectar thick) 3.  Material enters airway, remains ABOVE vocal cords and not ejected out: Thin liquids (Level 0); Mildly thick liquids (Level 2, nectar thick) 8.  Material enters airway, passes BELOW cords without attempt by patient to eject out (silent aspiration) : Thin liquids (Level 0) Compensatory Strategies: Compensatory Strategies Compensatory strategies: Yes Straw: Ineffective Ineffective Straw: Thin liquid (Level 0) Other(comment): Ineffective (tactile pressure with spoon in effort to initiate oral transit) Ineffective Other(comment): Mildly thick liquid (Level 2, nectar thick); Moderately thick liquid (Level 3, honey thick)   General Information: Caregiver present: No (follow up education completed on phone with daughter)  Diet Prior to this Study: NPO; Cortrak/Small bore NG tube   Temperature : Febrile   Respiratory Status: WFL   Supplemental O2: Nasal cannula (2L)   History of Recent Intubation: Yes  Behavior/Cognition: Alert; Cooperative; Pleasant mood Self-Feeding Abilities: Needs set-up for self-feeding Baseline vocal quality/speech: -- (intermittent  vocal wetness) Volitional Cough: Unable to elicit Volitional Swallow: Unable to elicit Exam Limitations: No limitations Goal Planning: Prognosis for improved oropharyngeal function: Fair Barriers to Reach Goals: Cognitive deficits No data recorded Patient/Family Stated Goal: drink water Consulted and agree with results and recommendations: Patient; Family member/caregiver; Nurse Pain: Pain Assessment Pain Assessment: No/denies pain Breathing: 0 Negative Vocalization: 0 Facial Expression: 0 Body Language: 1 Consolability: 0 PAINAD Score: 1 Facial Expression: 0 Body Movements: 0 Muscle Tension: 0 Compliance with ventilator (intubated pts.): N/A Vocalization (extubated pts.): 0 CPOT Total: 0 End of Session: Start Time:SLP Start Time (ACUTE ONLY): 0900 Stop Time: SLP Stop Time (ACUTE ONLY): 0930 Time Calculation:SLP Time Calculation (min) (ACUTE ONLY): 30 min Charges: SLP Evaluations $ SLP Speech Visit: 1 Visit SLP Evaluations $BSS Swallow: 1 Procedure $MBS Swallow: 1 Procedure SLP visit diagnosis: SLP Visit Diagnosis: Dysphagia, oropharyngeal phase (R13.12) Past Medical History: Past Medical History: Diagnosis Date  Cancer (HCC)   prostate  Follicular lymphoma (HCC) 2024  History of pulmonary embolism   Hypertension   Myelodysplastic syndrome (HCC)   PAF (paroxysmal atrial fibrillation) (HCC)   Recurrent UTI  Past Surgical History: Past Surgical History: Procedure Laterality Date  APPLICATION OF CRANIAL NAVIGATION Right 03/10/2023  Procedure: APPLICATION OF CRANIAL NAVIGATION;  Surgeon: Tressie Stalker, MD;  Location: [facility] OR;  Service: Neurosurgery;  Laterality: Right;  BIOPSY  08/21/2022  Procedure: BIOPSY;  Surgeon: Beverley Fiedler, MD;  Location: [facility] ENDOSCOPY;  Service: Gastroenterology;;  CRANIOTOMY Right 03/10/2023  Procedure: PARIETAL CRANIOTOMY;  Surgeon: Tressie Stalker, MD;  Location: [facility] OR;  Service: Neurosurgery;  Laterality: Right;  ESOPHAGOGASTRODUODENOSCOPY (EGD) WITH PROPOFOL N/A 08/21/2022  Procedure:  ESOPHAGOGASTRODUODENOSCOPY (EGD) WITH PROPOFOL;  Surgeon: Beverley Fiedler, MD;  Location: [facility] ENDOSCOPY;  Service: Gastroenterology;  Laterality: N/A;  IR IMAGING GUIDED PORT INSERTION  08/26/2022  LYMPHADENECTOMY Bilateral 09/01/2019  Procedure: LYMPHADENECTOMY;  Surgeon: Sebastian Ache, MD;  Location: [facility] ORS;  Service: Urology;  Laterality: Bilateral;  ROBOT ASSISTED LAPAROSCOPIC RADICAL PROSTATECTOMY N/A 09/01/2019  Procedure: XI ROBOTIC ASSISTED LAPAROSCOPIC RADICAL PROSTATECTOMY;  Surgeon: Sebastian Ache, MD;  Location: [facility] ORS;  Service: Urology;  Laterality: N/A;  3 HRS [name] 04/28/2023, 11:06 AM  Overnight EEG with video Result Date: 04/28/2023 Charlsie Quest, MD     04/29/2023 10:44 AM Patient Name: Tyler [name] MRN: [id number] Epilepsy Attending: Charlsie Quest Referring Physician/Provider: Gordy Councilman, MD Duration: 04/27/2023 1608 to 04/28/2023 1608 Patient history: 73yo M patient with 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 EEG to evaluate for seizure Level of alertness: Awake/lethargic AEDs during EEG study: LEV, LCM Technical aspects: This EEG study was done with scalp electrodes positioned according to the 10-20 International system of electrode placement. Electrical activity was reviewed with band pass filter of 1-70Hz , sensitivity of 7 uV/mm, display speed of 23mm/sec with a 60Hz  notched filter applied as appropriate. EEG data were recorded continuously and digitally stored.  Video monitoring was available and reviewed as appropriate. Description: No clear posterior dominant rhythm was seen.  EEG showed continuous generalized and maximal right parietal 3 to 6 Hz theta-delta slowing. Lateralized periodic discharges were noted in right hemisphere, maximal right parietal region at 1 Hz, at times with overlying rhythmicity. Seizures without clinical signs were noted arising from right parietal region, average 5 seizures per hour, lasting 1 to 2 minutes each.  During seizure EEG  showed 5 to 6 Hz theta slowing admixed with sharp waves and right parietal region which then involved all of right hemisphere and evolved into 2 to 3 Hz delta slowing.  IV Vimpat was administered on 04/28/2023 at around 1042. Gradually after around 1300, EEG improved and showed lateralized periodic discharges in right hemisphere, maximal right parietal region at 1hz  with overlying rhythmicity.  Hyperventilation and photic stimulation were not performed.   ABNORMALITY -Seizure without clinical signs, right parietal region -Lateralized periodic discharges with overlying rhythmicity, right hemisphere, maximal right parietal  region ( LPD+R) -Continuous slow, generalized and maximal right parietal region IMPRESSION: This study showed seizures without clinical signs arising from right parietal region, average 5 seizures per hour lasting 1 to 2 minutes each.  IV Vimpat was administered on 04/28/2023 at around 1042.  Gradually after around 1300, EEG improved and no definite seizures were noted. Additionally there is evidence of epileptogenicity and cortical dysfunction arising from right hemisphere, maximal right parietal region likely secondary to underlying structural abnormality.  Charlsie Quest   DG CHEST PORT 1 VIEW Result Date: 04/27/2023 CLINICAL DATA:  Acute hypoxemic respiratory failure. EXAM: PORTABLE CHEST 1 VIEW COMPARISON:  Chest radiograph dated 04/26/2023. FINDINGS: The heart is enlarged. An endotracheal tube terminates  in the midthoracic trachea. An enteric tube enters the stomach and terminates below the field of view. A right internal jugular central venous port catheter tip overlies the right atrium. There is mild bibasilar atelectasis/airspace disease, similar to prior exam. IMPRESSION: Mild bibasilar atelectasis/airspace disease, similar to prior exam. Electronically Signed   By: Romona Curls M.D.   On: 04/27/2023 14:57   ECHOCARDIOGRAM COMPLETE Result Date: 04/26/2023    ECHOCARDIOGRAM REPORT    Patient Name:   Tyler [name] Date of Exam: 04/26/2023 Medical Rec #:  [id number]    Height:       67.0 in Accession #:    [id number]   Weight:       184.1 lb Date of Birth:  March 30, 1950    BSA:          1.952 m Patient Age:    72 years     BP:           151/88 mmHg Patient Gender: M            HR:           48 bpm. Exam Location:  Inpatient Procedure: 2D Echo, Color Doppler and Cardiac Doppler (Both Spectral and Color            Flow Doppler were utilized during procedure). Indications:    Resp Distress  History:        Patient has prior history of Echocardiogram examinations, most                 recent 10/03/2022. Chemo; Risk Factors:Hypertension.  Sonographer:    Amy Chionchio Referring Phys: [id number] MURALI RAMASWAMY IMPRESSIONS  1. Left ventricular ejection fraction, by estimation, is 60 to 65%. The left ventricle has normal function. The left ventricle has no regional wall motion abnormalities. There is mild left ventricular hypertrophy. Left ventricular diastolic parameters are indeterminate.  2. Right ventricular systolic function is normal. The right ventricular size is normal. Tricuspid regurgitation signal is inadequate for assessing PA pressure.  3. The mitral valve is normal in structure. Trivial mitral valve regurgitation. No evidence of mitral stenosis.  4. The aortic valve is tricuspid. Aortic valve regurgitation is not visualized. No aortic stenosis is present.  5. The inferior vena cava is dilated in size with <50% respiratory variability, suggesting right atrial pressure of 15 mmHg. FINDINGS  Left Ventricle: Left ventricular ejection fraction, by estimation, is 60 to 65%. The left ventricle has normal function. The left ventricle has no regional wall motion abnormalities. Strain imaging was not performed. The left ventricular internal cavity  size was normal in size. There is mild left ventricular hypertrophy. Left ventricular diastolic parameters are indeterminate. Right Ventricle: The right ventricular  size is normal. No increase in right ventricular wall thickness. Right ventricular systolic function is normal. Tricuspid regurgitation signal is inadequate for assessing PA pressure. Left Atrium: Left atrial size was normal in size. Right Atrium: Right atrial size was normal in size. Pericardium: There is no evidence of pericardial effusion. Mitral Valve: The mitral valve is normal in structure. Trivial mitral valve regurgitation. No evidence of mitral valve stenosis. MV peak gradient, 2.4 mmHg. The mean mitral valve gradient is 1.0 mmHg. Tricuspid Valve: The tricuspid valve is normal in structure. Tricuspid valve regurgitation is trivial. Aortic Valve: The aortic valve is tricuspid. Aortic valve regurgitation is not visualized. No aortic stenosis is present. Aortic valve mean gradient measures 3.0 mmHg. Aortic valve peak gradient measures 5.7 mmHg. Aortic valve area, by VTI measures  2.11 cm. Pulmonic Valve: The pulmonic valve was grossly normal. Pulmonic valve regurgitation is not visualized. Aorta: The aortic root and ascending aorta are structurally normal, with no evidence of dilitation. Venous: The inferior vena cava is dilated in size with less than 50% respiratory variability, suggesting right atrial pressure of 15 mmHg. IAS/Shunts: The interatrial septum was not well visualized. Additional Comments: 3D imaging was not performed.  LEFT VENTRICLE PLAX 2D LVIDd:         4.20 cm     Diastology LVIDs:         2.70 cm     LV e' medial:    5.11 cm/s LV PW:         1.10 cm     LV E/e' medial:  14.0 LV IVS:        0.90 cm     LV e' lateral:   7.94 cm/s LVOT diam:     2.20 cm     LV E/e' lateral: 9.0 LV SV:         58 LV SV Index:   30 LVOT Area:     3.80 cm  LV Volumes (MOD) LV vol d, MOD A2C: 69.9 ml LV vol d, MOD A4C: 90.8 ml LV vol s, MOD A2C: 23.8 ml LV vol s, MOD A4C: 29.9 ml LV SV MOD A2C:     46.1 ml LV SV MOD A4C:     90.8 ml LV SV MOD BP:      53.6 ml RIGHT VENTRICLE             IVC RV Basal diam:  3.10  cm     IVC diam: 2.20 cm RV S prime:     14.90 cm/s TAPSE (M-mode): 1.7 cm LEFT ATRIUM             Index        RIGHT ATRIUM          Index LA Vol (A2C):   31.3 ml 16.03 ml/m  RA Area:     9.52 cm LA Vol (A4C):   54.0 ml 27.66 ml/m  RA Volume:   14.30 ml 7.32 ml/m LA Biplane Vol: 43.6 ml 22.33 ml/m  AORTIC VALVE                    PULMONIC VALVE AV Area (Vmax):    2.48 cm     PV Vmax:       0.77 m/s AV Area (Vmean):   2.33 cm     PV Peak grad:  2.4 mmHg AV Area (VTI):     2.11 cm AV Vmax:           119.00 cm/s AV Vmean:          78.400 cm/s AV VTI:            0.275 m AV Peak Grad:      5.7 mmHg AV Mean Grad:      3.0 mmHg LVOT Vmax:         77.50 cm/s LVOT Vmean:        48.100 cm/s LVOT VTI:          0.153 m LVOT/AV VTI ratio: 0.56  AORTA Ao Root diam: 3.30 cm Ao Asc diam:  3.40 cm MITRAL VALVE MV Area (PHT): 2.70 cm    SHUNTS MV Area VTI:   2.10 cm    Systemic VTI:  0.15 m MV Peak grad:  2.4 mmHg  Systemic Diam: 2.20 cm MV Mean grad:  1.0 mmHg MV Vmax:       0.77 m/s MV Vmean:      35.3 cm/s MV Decel Time: 281 msec MV E velocity: 71.70 cm/s MV A velocity: 83.10 cm/s MV E/A ratio:  0.86 Epifanio Lesches MD Electronically signed by Epifanio Lesches MD Signature Date/Time: 04/26/2023/2:18:08 PM    Final    DG CHEST PORT 1 VIEW Result Date: 04/26/2023 CLINICAL DATA:  Endotracheal tube present. EXAM: PORTABLE CHEST 1 VIEW COMPARISON:  Radiograph yesterday.  CT earlier today FINDINGS: Endotracheal tube tip 4.6 cm from the carina. Weighted enteric tube tip below the diaphragm not included in the field of view. Right chest port unchanged in position. Stable heart size and mediastinal contours. Left lung base opacity has progressed from yesterday's radiograph, subtotal lower lobe collapse on CT. Additional streaky atelectasis in the left mid lung. Right lower lobe opacity on CT has no definite radiographic correlate. No pneumothorax or large pleural effusion. IMPRESSION: 1. Endotracheal tube tip 4.6 cm  from the carina. 2. Left lung base opacity has progressed from yesterday's radiograph, subtotal lower lobe collapse on this morning's CT. Additional streaky atelectasis in the left mid lung. Electronically Signed   By: Narda Rutherford M.D.   On: 04/26/2023 13:27   CT CHEST W CONTRAST Result Date: 04/26/2023 CLINICAL DATA:  Head neck cancer.  Airway compromise with intubation EXAM: CT CHEST WITH CONTRAST TECHNIQUE: Multidetector CT imaging of the chest was performed during intravenous contrast administration. RADIATION DOSE REDUCTION: This exam was performed according to the departmental dose-optimization program which includes automated exposure control, adjustment of the mA and/or kV according to patient size and/or use of iterative reconstruction technique. CONTRAST:  OMNIPAQUE IOHEXOL 300 MG/ML  SOLN COMPARISON:  Head CT 04/16/2023 FINDINGS: Cardiovascular: Normal heart size. No pericardial effusion. Atheromatous calcification of the coronary arteries. Porta catheter with tip at the upper right atrium. Mediastinum/Nodes: Located endotracheal and enteric tube. No mass or adenopathy. Lungs/Pleura: Multi segment opacification in the lower lobes with volume loss, worse on the left. No consolidation, edema, effusion, or pneumothorax. Upper Abdomen: Enteric tube with tip at the pylorus. No acute finding Musculoskeletal: Thoracic spondylosis.  No acute finding IMPRESSION: Multi segment atelectasis in the lower lobes Electronically Signed   By: Tiburcio Pea M.D.   On: 04/26/2023 06:31   CT HEAD WO CONTRAST ( ) Result Date: 04/26/2023 CLINICAL DATA:  Mental status change.  History of CNS lymphoma. EXAM: CT HEAD WITHOUT CONTRAST TECHNIQUE: Contiguous axial images were obtained from the base of the skull through the vertex without intravenous contrast. RADIATION DOSE REDUCTION: This exam was performed according to the departmental dose-optimization program which includes automated exposure control,  adjustment of the mA and/or kV according to patient size and/or use of iterative reconstruction technique. COMPARISON:  Brain MRI 04/24/2023 FINDINGS: Brain: Cortical edema at the right insula correlating with abnormality on prior brain MRI. Small area of blood products in the parasagittal right parietal lobe with adjacent swelling, operative site. No evidence of interval infarct, hydrocephalus, or collection. Vascular: No hyperdense vessel or unexpected calcification. Skull: Unremarkable right parietal craniotomy site Sinuses/Orbits: Negative IMPRESSION: No acute finding when compared to 04/24/2023 brain MRI. Electronically Signed   By: Tiburcio Pea M.D.   On: 04/26/2023 05:09   CT SOFT TISSUE NECK W CONTRAST Result Date: 04/26/2023 CLINICAL DATA:  Soft tissue infection suspected. Neck swelling and airway compromise EXAM: CT NECK WITH CONTRAST TECHNIQUE: Multidetector CT imaging of the neck  was performed using the standard protocol following the bolus administration of intravenous contrast. RADIATION DOSE REDUCTION: This exam was performed according to the departmental dose-optimization program which includes automated exposure control, adjustment of the mA and/or kV according to patient size and/or use of iterative reconstruction technique. CONTRAST:  OMNIPAQUE IOHEXOL 300 MG/ML  SOLN COMPARISON:  04/16/2023 head CT FINDINGS: Pharynx and larynx: The airway is collapsed around endotracheal tube and enteric tube with fluid layering in the nasopharynx. This limits assessment of the mucosal surfaces, no evidence of submucosal edema. Salivary glands: No inflammation, mass, or stone. Thyroid: Normal. Lymph nodes: None enlarged or heterogeneous. Vascular: No acute finding Limited intracranial: Negative Visualized orbits: Negative Mastoids and visualized paranasal sinuses: Clear Skeleton: No acute or aggressive finding. Upper chest: Clear apical lungs. IMPRESSION: No acute finding. Assessment of mucosal  surfaces limited by airway collapse in the setting of intubation. Electronically Signed   By: Tiburcio Pea M.D.   On: 04/26/2023 05:03   DG Abd 1 View Result Date: 04/26/2023 CLINICAL DATA:  Check gastric catheter placement EXAM: ABDOMEN - 1 VIEW COMPARISON:  None Available. FINDINGS: Weighted feeding catheter is noted in the distal stomach directed towards the pole or is. No free air is seen. No obstructive changes are noted. IMPRESSION: Feeding catheter as described. Electronically Signed   By: Alcide Clever M.D.   On: 04/26/2023 02:11   DG CHEST PORT 1 VIEW Result Date: 04/25/2023 CLINICAL DATA:  Check endotracheal tube placement EXAM: PORTABLE CHEST 1 VIEW COMPARISON:  Film from earlier in the same day. FINDINGS: Endotracheal tube is noted 5.3 cm above the carina. Gastric catheter extends into the stomach. Right chest wall port is again seen. Cardiac shadow is stable. No focal infiltrate is noted. Mild basilar atelectasis is seen. IMPRESSION: Tubes and lines as described. Electronically Signed   By: Alcide Clever M.D.   On: 04/25/2023 21:44   DG Chest Port 1 View Result Date: 04/25/2023 CLINICAL DATA:  Altered mental status EXAM: PORTABLE CHEST 1 VIEW COMPARISON:  04/10/2023 FINDINGS: Cardiac shadow is enlarged but stable. Right chest wall port is again seen. Mild basilar atelectasis is seen. No bony abnormality is noted. IMPRESSION: Mild basilar atelectasis. Electronically Signed   By: Alcide Clever M.D.   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 History of B-cell lymphoma EXAM: MRI HEAD WITHOUT AND WITH CONTRAST TECHNIQUE: Multiplanar, multiecho pulse sequences of the brain and surrounding structures were obtained without and with intravenous contrast. CONTRAST:  8mL GADAVIST GADOBUTROL 1 MMOL/ML IV SOLN COMPARISON:  Comparison made with previous MRI from 04/07/2023 as well as earlier  studies. FINDINGS: Brain: Postoperative changes from prior right parietal craniotomy again seen. 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 Size of the lesion is markedly decreased from prior, now measuring 2.9 x 3.5 x 2.3 cm (series 18, image 10). Superimposed susceptibility artifact consistent with blood products. Persistent but improved vasogenic edema within the adjacent posterior right cerebral hemisphere, with improved/resolved mass effect on the right lateral ventricle. Previously seen right to left shift has resolved. Findings consistent with interval response to therapy. Now seen is restricted diffusion involving the right insular cortex and overlying posterior right frontal region (series 5, images 26, 30). Associated gyral swelling with T2/FLAIR signal abnormality within this location (series 9, image 28). No associated enhancement. 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 No other mass  lesion or abnormal enhancement. No other acute or subacute infarct. No hydrocephalus or extra-axial fluid collection. Underlying cerebral white matter disease noted, stable. Vascular: Major intracranial vascular flow voids are maintained. Skull and upper cervical spine: Craniocervical junction within normal limits. Bone marrow signal intensity within normal limits. Post craniotomy changes at the right parietal calvarium without adverse features. Sinuses/Orbits: Globes and orbital soft tissues within normal limits. Mild mucosal thickening present about the ethmoidal air cells and maxillary sinuses. No mastoid effusion. Other: None. IMPRESSION: 1. Marked interval decrease in size of right parietal mass/lymphoma, now measuring 2.9 x 3.5 x 2.3 cm (previously 5.4 x 3.6 x 4.4 cm), consistent with interval response to therapy. Persistent but improved vasogenic edema within the adjacent posterior right cerebral hemisphere, with interval resolution of previously seen right-to-left  midline shift. 2. 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 Correlation with symptomatology and EEG recommended. Evolving changes of subacute ischemia would be the primary differential consideration, but are felt to be less likely. Electronically Signed   By: Rise Mu M.D.   On: 04/25/2023 05:24   ADDENDUM  .Patient was Personally and independently interviewed, examined and relevant elements of the history of present illness were reviewed in details and an assessment and plan was created. All elements of the patient's history of present illness , assessment and plan were discussed in details with Hinton Dyer NP. The above documentation reflects our combined findings assessment and plan.  Patient has had a significant change in functional status with neurology now noting that he has a right sided CVA with left-sided weakness.  Seizures currently controlled on multiple seizure medications.  Still has NG tube for feeding and is still having some difficulties handling oral secretions. I discussed his current status and goals of care in detail with his daughter Sherron Flemings NP at bedside and his wife. They understand the difficulties of his current situation. We discussed options for consideration of best supportive care through hospice versus palliative radiation followed by best supportive care. Patient's family notes that the primary goal is to try to get him back home to [place] because it was the patient's strong will to be with his family in [place] and be buried there.  They wondered if there might be some period of stability that is possible for him to fly back to [place]. 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  We discussed that with his high risk of infection and poor performance status it would be difficult for him to tolerate additional high-dose  methotrexate. Will consult radiation oncology for their input.    Wyvonnia Lora MD MS

## 2023-05-12 NOTE — Progress Notes (Addendum)
 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 Colonel Bald completed 6 cycles of R-CHOP with remission of the parenchymal lesion receiving high-dose methotrexate cycle 2 to start  Patient was readmitted on 2/25 for second cycle of methotrexate/leucovorin and was noted to have some left-sided weakness as well as partial sensory seizures and was on Keppra from the prior admission  2/27-marked interval decrease in parietal mass 3 x 3.5 x 2.3 with vasogenic edema and persistent diffusion abnormality right insular cortex stroke 2/28 witnessed to have left neck swelling Twitching left side confused and was intubated 3/1 core track placed intubated 3/2 extubated on continuous EEG 3/3 continued focal seizures 3/14 extubated    Plan  Acute ischemic stroke Underlying focal seizures secondary to cerebral edema Not really communicating with me at this time-does seem to understand-continue on Decadron 4 mg every 12 Ativan 2 mg every 4 as needed seizure IV, SymPak 200 twice daily via tube, Keppra 2 g twice daily, gabapentin 100 every 8 May require taper of steroids will contact oncology to evaluate if further treatment needs to be done in house He is on aspirin 81 He has a dense dysarthria and significant left-sided weakness he is unable to move his left upper extremity Will require extensive  therapy and reevaluation with SLP--continue Osmolite feeds @ 55cc/h  Likely aspiration prior to admission Started on Zosyn on 3/13 would complete 5 days on 3/17 Repeat chest x-ray a.m.  High-grade B-cell lymphoma with resection and recent chemo Recently given methotrexate Continues on Decadron 4 every 12 IV and antiepileptics as above May need goals of care  Atrial fibrillation not on anticoagulation secondary to high bleeding risk Not on rate control or anticoagulation despite dual indications  Previous PEs See above discussion  Prostate cancer  Hepatitis B Continue tenofovir 25 daily  DVT prophylaxis: Lovenox  Status is: Inpatient Remains inpatient appropriate because:   Unclear disposition Subjective: Awake follows commands but not really verbal No fever no chills no nausea no vomiting Seems to neglect the left side and had stents paralysis on the left upper arm and left leg  Objective + exam Vitals:   05/12/23 0045 05/12/23 0200 05/12/23 0346 05/12/23 0500  BP:   122/80   Pulse:   77   Resp: 20 20    Temp:   98.5 F (36.9 C)   TempSrc:   Oral   SpO2:   100%   Weight:    87.2 kg  Height:       Filed Weights   05/10/23 0500 05/11/23 0500 05/12/23 0500  Weight: 86 kg 86 kg 87.2 kg    Examination: Relatively ill-appearing black male no distress EOMI NCAT some neglect on left side S1-S2 no murmur on monitors sinus rhythm Abdomen soft no rebound, feeding tube in place Power 4/5 left lower extremity 3/5 right upper extremity Moving right arm fairly well Does not follow commands adequately to test reflexes and/or coordination  Data Reviewed: reviewed  CBC    Component Value Date/Time   WBC 7.4 05/11/2023 0500   RBC 3.90 (L) 05/11/2023 0500   HGB 11.8 (L) 05/11/2023 0500   HGB 14.4 04/18/2023 1343   HCT 35.3 (L) 05/11/2023 0500   PLT 267 05/11/2023 0500   PLT 119 (L) 04/18/2023 1343   MCV 90.5 05/11/2023 0500   MCH 30.3 05/11/2023 0500   MCHC 33.4  05/11/2023 0500   RDW 13.4 05/11/2023 0500   LYMPHSABS 0.8 04/27/2023 0432   MONOABS 0.1 04/27/2023 0432   EOSABS 0.0 04/27/2023 0432   BASOSABS 0.0 04/27/2023 0432      Latest Ref Rng & Units 05/11/2023    5:00 AM 05/09/2023    4:07 AM 05/08/2023    4:40 AM  CMP  Glucose 70 - 99 mg/dL 914  782  956   BUN 8 - 23 mg/dL 22  28  25    Creatinine 0.61 - 1.24 mg/dL 2.13  0.86  5.78   Sodium 135 - 145 mmol/L 134  130  130   Potassium 3.5 - 5.1 mmol/L 4.2  4.2  4.4   Chloride 98 - 111 mmol/L 98  98  99   CO2 22 - 32 mmol/L 29  24  25    Calcium 8.9 - 10.3 mg/dL 8.4  8.0  8.1   Total Protein 6.5 - 8.1 g/dL  5.2  5.4   Total Bilirubin 0.0 - 1.2 mg/dL  0.4  0.4   Alkaline Phos 38 - 126 U/L  56  52   AST 15 - 41 U/L  29  23   ALT 0 - 44 U/L  43  38     Scheduled Meds:  aspirin EC  81 mg Oral Daily   Chlorhexidine Gluconate Cloth  6 each Topical Nightly   cholecalciferol  2,000 Units Per Tube Daily   vitamin B-12  1,000 mcg Per Tube Daily   dexamethasone  4 mg Per Tube Q12H   enoxaparin (LOVENOX) injection  40 mg Subcutaneous Q24H   feeding supplement (PROSource TF20)  60 mL Per Tube BID   folic acid  1 mg Per Tube Daily   gabapentin  100 mg Per Tube Q8H   insulin aspart  0-15 Units Subcutaneous Q4H   lacosamide  200 mg Per Tube BID   levETIRAcetam  2,000 mg Per Tube BID   mouth rinse  15 mL Mouth Rinse 4 times per day   scopolamine  1 patch Transdermal Q72H   sodium chloride flush  10-40 mL Intracatheter Q12H   sodium chloride flush  3-10 mL Intravenous Q12H   tenofovir alafenamide  25 mg Per Tube Daily   thiamine  100 mg Per Tube Daily   Continuous Infusions:  feeding supplement (OSMOLITE 1.5 CAL) 1,000 mL (05/12/23 0038)   ondansetron (ZOFRAN) IV     piperacillin-tazobactam (ZOSYN)  IV 3.375 g (05/12/23 4696)    Time 37  Rhetta Mura, MD  [facility]

## 2023-05-12 NOTE — Plan of Care (Signed)
  Problem: Nutrition: Goal: Adequate nutrition will be maintained Outcome: Progressing   Problem: Elimination: Goal: Will not experience complications related to urinary retention Outcome: Progressing   Problem: Pain Managment: Goal: General experience of comfort will improve and/or be controlled Outcome: Progressing   Problem: Education: Goal: Knowledge of General Education information will improve Description: Including pain rating scale, medication(s)/side effects and non-pharmacologic comfort measures Outcome: Not Progressing   Problem: Clinical Measurements: Goal: Ability to maintain clinical measurements within normal limits will improve Outcome: Not Progressing Goal: Will remain free from infection Outcome: Not Progressing Goal: Respiratory complications will improve Outcome: Not Progressing   Problem: Activity: Goal: Risk for activity intolerance will decrease Outcome: Not Progressing   Problem: Elimination: Goal: Will not experience complications related to bowel motility Outcome: Not Progressing

## 2023-05-13 ENCOUNTER — Inpatient Hospital Stay (HOSPITAL_COMMUNITY)

## 2023-05-13 DIAGNOSIS — C8339 Primary central nervous system lymphoma: Secondary | ICD-10-CM | POA: Diagnosis not present

## 2023-05-13 LAB — GLUCOSE, CAPILLARY
Glucose-Capillary: 107 mg/dL — ABNORMAL HIGH (ref 70–99)
Glucose-Capillary: 109 mg/dL — ABNORMAL HIGH (ref 70–99)
Glucose-Capillary: 112 mg/dL — ABNORMAL HIGH (ref 70–99)
Glucose-Capillary: 114 mg/dL — ABNORMAL HIGH (ref 70–99)
Glucose-Capillary: 119 mg/dL — ABNORMAL HIGH (ref 70–99)
Glucose-Capillary: 123 mg/dL — ABNORMAL HIGH (ref 70–99)
Glucose-Capillary: 153 mg/dL — ABNORMAL HIGH (ref 70–99)
Glucose-Capillary: 163 mg/dL — ABNORMAL HIGH (ref 70–99)
Glucose-Capillary: 98 mg/dL (ref 70–99)

## 2023-05-13 LAB — CULTURE, BLOOD (ROUTINE X 2)
Culture: NO GROWTH
Culture: NO GROWTH

## 2023-05-13 MED ORDER — ASPIRIN 300 MG RE SUPP
300.0000 mg | Freq: Every day | RECTAL | Status: DC
  Administered 2023-05-13 – 2023-05-15 (×3): 300 mg via RECTAL
  Filled 2023-05-13 (×5): qty 1

## 2023-05-13 MED ORDER — BANATROL TF EN LIQD
60.0000 mL | Freq: Three times a day (TID) | ENTERAL | Status: DC
  Administered 2023-05-13 – 2023-05-22 (×24): 60 mL
  Filled 2023-05-13 (×25): qty 60

## 2023-05-13 MED ORDER — ORAL CARE MOUTH RINSE: 15.0000 mL | OROMUCOSAL | Status: DC | PRN

## 2023-05-13 MED ORDER — ORAL CARE MOUTH RINSE
15.0000 mL | OROMUCOSAL | Status: DC
  Administered 2023-05-13 – 2023-05-14 (×8): 15 mL via OROMUCOSAL

## 2023-05-13 NOTE — Plan of Care (Signed)
 No acute changes. Conts on TF w/cotrack, 2L [place].   Problem: Coping: Goal: Level of anxiety will decrease Outcome: Not Met (add Reason)   Problem: Pain Managment: Goal: General experience of comfort will improve and/or be controlled Outcome: Not Met (add Reason)   Problem: Skin Integrity: Goal: Risk for impaired skin integrity will decrease Outcome: Not Met (add Reason)   Problem: Respiratory: Goal: Ability to maintain a clear airway and adequate ventilation will improve Outcome: Not Met (add Reason)

## 2023-05-13 NOTE — Progress Notes (Signed)
 Pt is on feeding tube. NT suction on hold at this time.

## 2023-05-13 NOTE — Progress Notes (Signed)
 Radiation Oncology         [phone] ________________________________  Initial inpatient Consultation  Name: Tyler [name] MRN: [id number]  Date: 05/14/2023  DOB: 16-Apr-1950  YQ:MVHQION, Mabeline Caras, FNP  Johney Maine, MD   REFERRING PHYSICIAN: Johney Maine, MD  DIAGNOSIS:  No diagnosis found.  Hx of Prostate cancer (HCC),   Diffuse large B-cell lymphoma of lymph nodes of multiple regions [facility])  CNS lymphoma  HISTORY OF PRESENT ILLNESS::Tyler [name] is a 73 y.o. male with a history of prostate cancer status post prostatectomy in 2021 and a bilateral pulmonary embolism following his procedure.   He is currently followed by Dr. Wyvonnia Lora for management of his diffuse large B-cell lymphoma of lymph nodes of multiple regions initially diagnosed May 2024. S/p craniotomy with gross total resection on 03/10/2023. Current therapy consists of IP Non-Hodgkins Lymphoma High Dose Methotrexate + Leucovorin Rescue which was reinitiated on 04-09-23 after presenting to the ED on 04-07-23 for left-sided weakness. MRI done at that time indicated that residual tumor has increased in size, now measuring 5.4 x 3.6 x 4.4 compared to 4.4 x 3.0 x 3.3 cm  previously along with prominent surrounding edema has slightly increased to 6 mm leftward midline shift has also increased. Scan also indicated an unchanged asymmetric prominence of the right lateral ventricle temporal horn consistent with ventricular entrapment. 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 As such, patient was started on Keppra 500 mg p.o. twice daily.  Patient presented to the ED on 04-22-23 to undergo his second cycle of high-dose methotrexate with leucovorin rescue.He was then admitted to inpatient care. Pertinent imaging done this far includes: --Restaging PET scan on 2-19 showed a small lucent lesion posterior to the left acetabulum with associated borderline hypermetabolism; Interval response  to therapy as evidenced by decrease in hypermetabolism involving cervical and axillary lymph nodes; Minimal residual atelectasis or scarring in the posteromedial right lower lobe, improved from 12/27/2022.  --MRI brain on 2-27 demonstrated a marked interval decrease in size of right parietal mass/lymphoma, now measuring 2.9 x 3.5 x 2.3 cm (previously 5.4 x 3.6 x 4.4 cm) and Persistent but improved vasogenic edema within the adjacent posterior right cerebral hemisphere, with interval resolution of previously seen right-to-left midline shift. Scan also showed a new  diffusion signal abnormality involving the right insular cortex and overlying right frontal lobe, favored to reflect sequelae of seizure. -- CT head 3-1 showing no acute finding when compared to 04/24/2023 brain MRI   --CT chest 3-1 showing multi segment atelectasis in the lower lobes. --CT soft tissue neck 3-1 showingNo acute finding. Assessment of mucosal surfaces limited by airway collapse in the setting of intubation.  -- DG Barium Swallow test 3-3 due to dysphagia and swollen neck showing intermittent, trace, shallow, transient penetration of nectar-thick and thin liquids by cup and shallow penetration without ejection of mixture of thin and nectar-thick residue towards end of study. Penetrated residue appeared to move slowly towards the airway, though remained above the vocal cords during assessment. 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 No hemorrhagic transformation. No significant intracranial mass effect. --MRI brain 3-11 showing increased extent of hyperintense T2-weighted signal in the right Hemisphere; Unchanged appearance of intraparenchymal hemorrhage in the posterior right parietal lobe; Resolving diffusion abnormality within the posterior right MCA territory.    --CTA  3-13 showing hypoattenuation throughout much of the right hemisphere, corresponding to the area of hyperintense T2-weighted signal on recent MRI. No intracranial hemorrhage.   PREVIOUS RADIATION THERAPY:  unclear whether he was previously treated with radiation. ***  PAST MEDICAL HISTORY:  has a past medical history of Cancer (HCC), Follicular lymphoma (HCC) (2024), History of pulmonary embolism, Hypertension, Myelodysplastic syndrome (HCC), PAF (paroxysmal atrial fibrillation) (HCC), and Recurrent UTI.    PAST SURGICAL HISTORY: Past Surgical History:  Procedure Laterality Date   APPLICATION OF CRANIAL NAVIGATION Right 03/10/2023   Procedure: APPLICATION OF CRANIAL NAVIGATION;  Surgeon: Tressie Stalker, MD;  Location: [facility] OR;  Service: Neurosurgery;  Laterality: Right;   BIOPSY  08/21/2022   Procedure: BIOPSY;  Surgeon: Beverley Fiedler, MD;  Location: [facility] ENDOSCOPY;  Service: Gastroenterology;;   CRANIOTOMY Right 03/10/2023   Procedure: PARIETAL CRANIOTOMY;  Surgeon: Tressie Stalker, MD;  Location: [facility] OR;  Service: Neurosurgery;  Laterality: Right;   ESOPHAGOGASTRODUODENOSCOPY (EGD) WITH PROPOFOL N/A 08/21/2022   Procedure: ESOPHAGOGASTRODUODENOSCOPY (EGD) WITH PROPOFOL;  Surgeon: Beverley Fiedler, MD;  Location: [facility] ENDOSCOPY;  Service: Gastroenterology;  Laterality: N/A;   IR IMAGING GUIDED PORT INSERTION  08/26/2022   LYMPHADENECTOMY Bilateral 09/01/2019   Procedure: LYMPHADENECTOMY;  Surgeon: Sebastian Ache, MD;  Location: [facility] ORS;  Service: Urology;  Laterality: Bilateral;   ROBOT ASSISTED LAPAROSCOPIC RADICAL PROSTATECTOMY N/A 09/01/2019   Procedure: XI ROBOTIC ASSISTED LAPAROSCOPIC RADICAL PROSTATECTOMY;  Surgeon: Sebastian Ache, MD;  Location: [facility] ORS;  Service: Urology;  Laterality: N/A;  3 HRS    FAMILY HISTORY: family history includes Stroke in his father.  SOCIAL HISTORY:  reports that he has never smoked. He has never used smokeless tobacco. He reports current alcohol use. He reports  that he does not use drugs.  ALLERGIES: Amitriptyline, Amlodipine, and Neurontin [gabapentin]  MEDICATIONS:  No current facility-administered medications for this encounter.   No current outpatient medications on file.   Facility-Administered Medications Ordered in Other Encounters  Medication Dose Route Frequency Provider Last Rate Last Admin   acetaminophen (TYLENOL) tablet 650 mg  650 mg Per Tube Q4H PRN Kalman Shan, MD   650 mg at 05/11/23 2151   alum & mag hydroxide-simeth (MAALOX/MYLANTA) 200-200-20 MG/5ML suspension 60 mL  60 mL Per Tube Q4H PRN Kalman Shan, MD   60 mL at 05/11/23 0000   aspirin suppository 300 mg  300 mg Rectal Daily Rhetta Mura, MD   300 mg at 05/13/23 1358   Chlorhexidine Gluconate Cloth 2 % PADS 6 each  6 each Topical Nightly Ranee Gosselin, MD   6 each at 05/13/23 0943   cholecalciferol (VITAMIN D3) 25 MCG (1000 UNIT) tablet 2,000 Units  2,000 Units Per Tube Daily Kalman Shan, MD   2,000 Units at 05/13/23 0940   cyanocobalamin (VITAMIN B12) tablet 1,000 mcg  1,000 mcg Per Tube Daily Luciano Cutter, MD   1,000 mcg at 05/13/23 0942   dexamethasone (DECADRON) tablet 4 mg  4 mg Per Tube Q12H Johney Maine, MD   4 mg at 05/13/23 2227   enoxaparin (LOVENOX) injection 40 mg  40 mg Subcutaneous Q24H Johney Maine, MD   40 mg at 05/13/23 2215   feeding supplement (OSMOLITE 1.5 CAL) liquid 1,000 mL  1,000 mL Per Tube Continuous Luciano Cutter, MD 55 mL/hr at 05/13/23 1401 1,000 mL at 05/13/23 1401   feeding supplement (PROSource TF20) liquid 60 mL  60 mL Per Tube BID Luciano Cutter, MD   60 mL at  05/13/23 2203   fiber supplement (BANATROL TF) liquid 60 mL  60 mL Per Tube TID Rhetta Mura, MD   60 mL at 05/13/23 2203   folic acid (FOLVITE) tablet 1 mg  1 mg Per Tube Daily Luciano Cutter, MD   1 mg at 05/13/23 0942   gabapentin (NEURONTIN) 250 MG/5ML solution 100 mg  100 mg Per Tube Q8H Mannam, Praveen, MD   100 mg  at 05/13/23 2212   hydrALAZINE (APRESOLINE) tablet 50 mg  50 mg Per Tube Q8H PRN Luciano Cutter, MD       hydrocortisone (ANUSOL-HC) 2.5 % rectal cream 1 Application  1 Application Rectal BID PRN Johney Maine, MD       ibuprofen (ADVIL) 100 MG/5ML suspension 200 mg  200 mg Per Tube Q6H PRN Kalman Shan, MD   200 mg at 05/09/23 1456   insulin aspart (novoLOG) injection 0-15 Units  0-15 Units Subcutaneous Q4H Luciano Cutter, MD   3 Units at 05/13/23 1631   lacosamide (VIMPAT) tablet 200 mg  200 mg Per Tube BID Charlsie Quest, MD   200 mg at 05/13/23 2225   levETIRAcetam (KEPPRA) 100 MG/ML solution 2,000 mg  2,000 mg Per Tube BID Charlsie Quest, MD   2,000 mg at 05/13/23 2213   lidocaine (LMX) 4 % cream   Topical TID PRN Jimmye Norman, NP       LORazepam (ATIVAN) injection 2 mg  2 mg Intravenous Q4H PRN Charlsie Quest, MD       magic mouthwash w/lidocaine  5 mL Oral QID PRN Johney Maine, MD       ondansetron [facility]) tablet 4-8 mg  4-8 mg Per Tube Q8H PRN Leander Rams, RPH   8 mg at 05/13/23 2225   Or   ondansetron (ZOFRAN-ODT) disintegrating tablet 4-8 mg  4-8 mg Oral Q8H PRN Leander Rams, [facility]       Or   ondansetron [facility]) injection 4 mg  4 mg Intravenous Q8H PRN Leander Rams, RPH       Or   ondansetron (ZOFRAN) 8 mg in sodium chloride 0.9 % 50 mL IVPB  8 mg Intravenous Q8H PRN Leander Rams, [facility]       Oral care mouth rinse  15 mL Mouth Rinse 4 times per day Rhetta Mura, MD   15 mL at 05/13/23 2213   Oral care mouth rinse  15 mL Mouth Rinse PRN Rhetta Mura, MD       polyethylene glycol (MIRALAX / GLYCOLAX) packet 17 g  17 g Per Tube Daily PRN Leander Rams, RPH       scopolamine (TRANSDERM-SCOP) 1 MG/3DAYS 1.5 mg  1 patch Transdermal Q72H Agarwala, Daleen Bo, MD   1.5 mg at 05/12/23 1341   senna-docusate (Senokot-S) tablet 1 tablet  1 tablet Per Tube QHS PRN Leander Rams, RPH       sodium chloride flush (NS) 0.9 % injection 10-40 mL   10-40 mL Intracatheter Q12H Johney Maine, MD   10 mL at 05/13/23 2202   sodium chloride flush (NS) 0.9 % injection 10-40 mL  10-40 mL Intracatheter PRN Johney Maine, MD   30 mL at 04/27/23 0540   sodium chloride flush (NS) 0.9 % injection 3-10 mL  3-10 mL Intravenous Q12H Lynnell Catalan, MD   10 mL at 05/13/23 2202   sodium chloride flush (NS) 0.9 % injection 3-10 mL  3-10 mL Intravenous PRN  Lynnell Catalan, MD       tenofovir alafenamide (VEMLIDY) tablet 25 mg  25 mg Per Tube Daily Danelle Earthly, MD   25 mg at 05/13/23 1324   thiamine (VITAMIN B1) tablet 100 mg  100 mg Per Tube Daily Luciano Cutter, MD   100 mg at 05/13/23 0941    REVIEW OF SYSTEMS:  As above.   PHYSICAL EXAM:  vitals were not taken for this visit.   General: Alert and oriented, in no acute distress *** HEENT: Head is normocephalic. Extraocular movements are intact. Oropharynx is clear. Neck: Neck is supple, no palpable cervical or supraclavicular lymphadenopathy. Heart: Regular in rate and rhythm with no murmurs, rubs, or gallops. Chest: Clear to auscultation bilaterally, with no rhonchi, wheezes, or rales. Abdomen: Soft, nontender, nondistended, with no rigidity or guarding. Extremities: No cyanosis or edema. Lymphatics: see Neck Exam Skin: No concerning lesions. Musculoskeletal: symmetric strength and muscle tone throughout. Neurologic: Cranial nerves II through XII are grossly intact. No obvious focalities. Speech is fluent. Coordination is intact. Psychiatric: Judgment and insight are intact. Affect is appropriate.   LABORATORY DATA:  Lab Results  Component Value Date   WBC 7.7 05/12/2023   HGB 12.0 (L) 05/12/2023   HCT 36.5 (L) 05/12/2023   MCV 90.3 05/12/2023   PLT 284 05/12/2023   CMP     Component Value Date/Time   NA 136 05/12/2023 1017   NA 140 08/30/2022 0835   K 4.3 05/12/2023 1017   CL 98 05/12/2023 1017   CO2 27 05/12/2023 1017   GLUCOSE 105 (H) 05/12/2023 1017   BUN 21  05/12/2023 1017   BUN 11 08/30/2022 0835   CREATININE 0.71 05/12/2023 1017   CREATININE 0.93 04/18/2023 1343   CREATININE 0.99 01/28/2023 0840   CALCIUM 8.6 (L) 05/12/2023 1017   PROT 5.6 (L) 05/12/2023 1017   ALBUMIN 2.7 (L) 05/12/2023 1017   AST 34 05/12/2023 1017   AST 19 04/18/2023 1343   ALT 77 (H) 05/12/2023 1017   ALT 53 (H) 04/18/2023 1343   ALKPHOS 66 05/12/2023 1017   BILITOT 0.6 05/12/2023 1017   BILITOT 0.6 04/18/2023 1343   EGFR 82 01/28/2023 0840   EGFR 50 (L) 08/30/2022 0835   GFRNONAA >60 05/12/2023 1017   GFRNONAA >60 04/18/2023 1343         RADIOGRAPHY: DG CHEST PORT 1 VIEW Result Date: 05/13/2023 CLINICAL DATA:  Pneumonia. EXAM: PORTABLE CHEST 1 VIEW COMPARISON:  Chest x-ray dated May 08, 2023. FINDINGS: Interval extubation. Unchanged feeding tube and right chest wall port catheter. Stable cardiomediastinal silhouette prominent mediastinal contours due to underlying mediastinal fat as seen on recent chest CT. Minimal linear atelectasis and/or scarring in the peripheral left lung. Density at the right lung base posterior to the for of could represent trace pleural fluid in the right minor fissure versus atelectasis. No pneumothorax. No acute osseous abnormality. IMPRESSION: 1. Interval extubation. 2. Trace pleural fluid in the right minor fissure versus basilar atelectasis. Electronically Signed   By: Obie Dredge M.D.   On: 05/13/2023 10:06   CT ANGIO HEAD NECK W WO CM Result Date: 05/08/2023 CLINICAL DATA:  Stroke follow-up EXAM: CT ANGIOGRAPHY HEAD AND NECK WITH AND WITHOUT CONTRAST TECHNIQUE: Multidetector CT imaging of the head and neck was performed using the standard protocol during bolus administration of intravenous contrast. Multiplanar CT image reconstructions and MIPs were obtained to evaluate the vascular anatomy. Carotid stenosis measurements (when applicable) are obtained utilizing NASCET criteria, using the distal  internal carotid diameter as the  denominator. RADIATION DOSE REDUCTION: This exam was performed according to the departmental dose-optimization program which includes automated exposure control, adjustment of the mA and/or kV according to patient size and/or use of iterative reconstruction technique. CONTRAST:  75mL OMNIPAQUE IOHEXOL 350 MG/ML SOLN COMPARISON:  05/06/2023 FINDINGS: CT HEAD FINDINGS Brain: Hypoattenuation throughout much of the right hemisphere, corresponding to the area of hyperintense T2-weighted signal on recent MRI. No intracranial hemorrhage. Vascular: No hyperdense vessel or unexpected vascular calcification. Skull: Remote right posterior craniotomy. Sinuses/Orbits: Bilateral maxillary and ethmoid sinus mucosal thickening. CTA NECK FINDINGS Skeleton: No acute abnormality or high grade bony spinal canal stenosis. Other neck: Normal pharynx, larynx and major salivary glands. No cervical lymphadenopathy. Unremarkable thyroid gland. Upper chest: No pneumothorax or pleural effusion. No nodules or masses. Aortic arch: There is no calcific atherosclerosis of the aortic arch. Conventional 3 vessel aortic branching pattern. RIGHT carotid system: Normal without aneurysm, dissection or stenosis. LEFT carotid system: Normal without aneurysm, dissection or stenosis. Vertebral arteries: Left dominant configuration. There is no dissection, occlusion or flow-limiting stenosis to the skull base (V1-V3 segments). CTA HEAD FINDINGS POSTERIOR CIRCULATION: Vertebral arteries are normal. No proximal occlusion of the anterior or inferior cerebellar arteries. Basilar artery is normal. Superior cerebellar arteries are normal. Posterior cerebral arteries are normal. ANTERIOR CIRCULATION: Intracranial internal carotid arteries are normal. Anterior cerebral arteries are normal. Middle cerebral arteries are normal. Venous sinuses: As permitted by contrast timing, patent. Anatomic variants: None Review of the MIP images confirms the above findings.  IMPRESSION: 1. No emergent large vessel occlusion or high-grade stenosis of the intracranial arteries. 2. Hypoattenuation throughout much of the right hemisphere, corresponding to the area of hyperintense T2-weighted signal on recent MRI. No intracranial hemorrhage. Electronically Signed   By: Deatra Robinson M.D.   On: 05/08/2023 20:07   DG Chest Port 1 View Result Date: 05/08/2023 CLINICAL DATA:  Acute respiratory failure. EXAM: PORTABLE CHEST 1 VIEW COMPARISON:  May 06, 2023. FINDINGS: Stable cardiomegaly. Endotracheal and feeding tubes are unchanged. Right internal jugular catheter is unchanged. Right lung is clear. Minimal left basilar subsegmental atelectasis is noted. IMPRESSION: Stable support apparatus. Minimal left basilar subsegmental atelectasis. Electronically Signed   By: Lupita Raider M.D.   On: 05/08/2023 09:39   VAS [place] LOWER EXTREMITY VENOUS (DVT) Result Date: 05/07/2023  Lower Venous DVT Study Patient Name:  OSVALDO LAMPING  Date of Exam:   05/06/2023 Medical Rec #: [id number]     Accession #:    [id number] Date of Birth: 05-28-1950     Patient Gender: M Patient Age:   9 years Exam Location:  [facility] Procedure:      VAS [place] LOWER EXTREMITY VENOUS (DVT) Referring Phys: Chilton Greathouse --------------------------------------------------------------------------------  Indications: Swelling, and Edema. Other Indications: A-fib, H/O P.E. Comparison Study: Previous study on 7.12.2021. Performing Technologist: Fernande Bras  Examination Guidelines: A complete evaluation includes B-mode imaging, spectral Doppler, color Doppler, and power Doppler as needed of all accessible portions of each vessel. Bilateral testing is considered an integral part of a complete examination. Limited examinations for reoccurring indications may be performed as noted. The reflux portion of the exam is performed with the patient in reverse Trendelenburg.   +---------+---------------+---------+-----------+----------+--------------+ RIGHT    CompressibilityPhasicitySpontaneityPropertiesThrombus Aging +---------+---------------+---------+-----------+----------+--------------+ CFV      Full           Yes      Yes                                 +---------+---------------+---------+-----------+----------+--------------+  SFJ      Full           Yes      Yes                                 +---------+---------------+---------+-----------+----------+--------------+ FV Prox  Full                                                        +---------+---------------+---------+-----------+----------+--------------+ FV Mid   Full                                                        +---------+---------------+---------+-----------+----------+--------------+ FV DistalFull                                                        +---------+---------------+---------+-----------+----------+--------------+ PFV      Full                                                        +---------+---------------+---------+-----------+----------+--------------+ POP      Full           Yes      Yes                                 +---------+---------------+---------+-----------+----------+--------------+ PTV      Full                                                        +---------+---------------+---------+-----------+----------+--------------+ PERO     Full                                                        +---------+---------------+---------+-----------+----------+--------------+   +---------+---------------+---------+-----------+----------+--------------+ LEFT     CompressibilityPhasicitySpontaneityPropertiesThrombus Aging +---------+---------------+---------+-----------+----------+--------------+ CFV      Full           Yes      Yes                                  +---------+---------------+---------+-----------+----------+--------------+ SFJ      Full           Yes      Yes                                 +---------+---------------+---------+-----------+----------+--------------+  FV Prox  Full                                                        +---------+---------------+---------+-----------+----------+--------------+ FV Mid   Full                                                        +---------+---------------+---------+-----------+----------+--------------+ FV DistalFull                                                        +---------+---------------+---------+-----------+----------+--------------+ PFV      Full                                                        +---------+---------------+---------+-----------+----------+--------------+ POP      Full           Yes      Yes                                 +---------+---------------+---------+-----------+----------+--------------+ PTV      Full                                                        +---------+---------------+---------+-----------+----------+--------------+ PERO     Full                                                        +---------+---------------+---------+-----------+----------+--------------+     Summary: BILATERAL: - No evidence of deep vein thrombosis seen in the lower extremities, bilaterally. -No evidence of popliteal cyst, bilaterally.   *See table(s) above for measurements and observations. Electronically signed by Coral Else MD on 05/07/2023 at 8:38:36 PM.    Final    MR BRAIN WO CONTRAST Result Date: 05/07/2023 CLINICAL DATA:  Stroke follow-up EXAM: MRI HEAD WITHOUT CONTRAST TECHNIQUE: Multiplanar, multiecho pulse sequences of the brain and surrounding structures were obtained without intravenous contrast. COMPARISON:  04/24/2023, 04/07/2023 FINDINGS: Brain: Resolving diffusion abnormality within the posterior right MCA  territory. No acute infarct. Unchanged appearance of intraparenchymal hemorrhage in the posterior right parietal lobe. Confluent hyperintense T2-weighted signal within the posterior right hemisphere now extends more anteriorly with greater involvement of the anterior right temporal lobe, right insula and frontal operculum. There is multifocal hyperintense T2-weighted signal within the white matter. Generalized volume loss. The midline structures are normal. Vascular: Normal flow voids. Skull and upper cervical spine: Normal calvarium and skull base. Visualized upper  cervical spine and soft tissues are normal. Sinuses/Orbits:Moderate paranasal sinus mucosal thickening. Normal orbits. IMPRESSION: 1. Resolving diffusion abnormality within the posterior right MCA territory. 2. Increased extent of hyperintense T2-weighted signal in the right hemisphere. This could indicate progression of lymphoma, or confluence of post-ischemic/post-ictal edema cytotoxic edema and lymphoma-related vasogenic edema. Compared to 04/07/2023, however, the area of abnormal signal has decreased. 3. Unchanged appearance of intraparenchymal hemorrhage in the posterior right parietal lobe. Electronically Signed   By: Deatra Robinson M.D.   On: 05/07/2023 00:52   CT HEAD WO CONTRAST ( ) Addendum Date: 05/06/2023 ADDENDUM REPORT: 05/06/2023 14:37 ADDENDUM: Study discussed by telephone with Dr. Lindie Spruce on 05/06/2023 at 1431 hours. Electronically Signed   By: Odessa Fleming M.D.   On: 05/06/2023 14:37   Result Date: 05/06/2023 CLINICAL DATA:  73 year old male with seizure. Right side weakness. Intubated. History of treated CNS lymphoma. EXAM: CT HEAD WITHOUT CONTRAST TECHNIQUE: Contiguous axial images were obtained from the base of the skull through the vertex without intravenous contrast. RADIATION DOSE REDUCTION: This exam was performed according to the departmental dose-optimization program which includes automated exposure control, adjustment  of the mA and/or kV according to patient size and/or use of iterative reconstruction technique. COMPARISON:  Head CT 04/26/2023 and earlier. FINDINGS: Brain: Confluent cytotoxic edema now in the right MCA territory, including all of the insula, operculum, most of the posterior right MCA territory. No hemorrhagic transformation. No significant midline shift or intracranial mass effect. No ventriculomegaly. No acute intracranial hemorrhage identified. Superimposed chronic postoperative, post treatment changes to the posterior right parietal lobe with dystrophic calcification there, stable. Stable since 04/26/2023 gray-white differentiation elsewhere. Basilar cisterns are patent. Vascular: Calcified atherosclerosis at the skull base. No suspicious intracranial vascular hyperdensity. Skull: Stable appearance of right posterior parietal craniotomy. Sinuses/Orbits: Right nasoenteric tube remains in place. Increased sinus mucosal thickening and opacification from earlier this month. Tympanic cavities and mastoids remain well aerated. Other: EEG leads in place. No acute orbit or scalp soft tissue finding. IMPRESSION: 1. 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 No hemorrhagic transformation. No significant intracranial mass effect. 2. Otherwise stable post treatment appearance of the brain, right parietal tumor resection. Electronically Signed: By: Odessa Fleming M.D. On: 05/06/2023 14:16   DG Chest Port 1 View Result Date: 05/06/2023 CLINICAL DATA:  Acute respiratory failure. EXAM: PORTABLE CHEST 1 VIEW COMPARISON:  Radiograph 05/03/2023 FINDINGS: Endotracheal tube tip 5.7 cm from the carina. Weighted enteric tube in place, tip not included in the field of view. Accessed right chest port in place. Stable heart size and mediastinal contours. No confluent airspace disease, large pleural effusion  or pneumothorax. No pulmonary edema. Left second rib fracture. IMPRESSION: 1. Endotracheal tube tip 5.7 cm from the carina. 2. Weighted enteric tube in place, tip not included in the field of view. 3. No acute findings. Electronically Signed   By: Narda Rutherford M.D.   On: 05/06/2023 11:01   NM PET Image Restag (PS) Skull Base To Thigh Result Date: 05/05/2023 CLINICAL DATA:  Subsequent treatment strategy for large B-cell lymphoma. EXAM: NUCLEAR MEDICINE PET SKULL BASE TO THIGH TECHNIQUE: 9.1 mCi F-18 FDG was injected intravenously. Full-ring PET imaging was performed from the skull base to thigh after the radiotracer. CT data was obtained and used for attenuation correction and anatomic localization. Fasting blood glucose: 98 mg/dl COMPARISON:  CT abdomen pelvis 03/07/2023 and PET  12/27/2022. FINDINGS: Mediastinal blood pool activity: SUV max 2.5 Liver activity: SUV max 4.1 NECK: Focal hypermetabolism along the high left mandible or deep left parotid gland, SUV max 5.3, unchanged. No definite CT correlate. Right level III lymph node measures 7 mm (4/39), SUV max 2.0, minimally decreased from 3.7. Incidental CT findings: None. CHEST: Axillary lymph nodes have decreased in hypermetabolism. Index 8 mm left axillary lymph node (4/54), SUV max 1.3, previously 2.7. No new abnormal hypermetabolism. Incidental CT findings: Right IJ Port-A-Cath terminates in the right atrium. Heart is enlarged. No pericardial or pleural effusion. ABDOMEN/PELVIS: No abnormal hypermetabolism. Incidental CT findings: Probable tiny left hepatic lobe cyst. No specific follow-up necessary. Low-attenuation lesions in the kidneys. No specific follow-up necessary. Soft tissue thickening and stranding along the posterior retroperitoneum bilaterally, new in the interval but nonspecific. Left inguinal hernia repair. SKELETON: 1.5 cm lucent lesion posterior to the left acetabulum (4/175), SUV max 2.5, unchanged. No additional abnormal  hypermetabolism. Incidental CT findings: Degenerative changes in the spine. IMPRESSION: 1. Interval response to therapy as evidenced by decrease in hypermetabolism involving cervical and axillary lymph nodes (Deauville 2). 2. Small lucent lesion posterior to the left acetabulum with associated borderline hypermetabolism, unchanged and nonspecific. Recommend attention on follow-up. 3. Minimal residual atelectasis or scarring in the posteromedial right lower lobe, improved from 12/27/2022. Electronically Signed   By: Leanna Battles M.D.   On: 05/05/2023 10:31   DG CHEST PORT 1 VIEW Result Date: 05/03/2023 CLINICAL DATA:  73 year old male intubated. EXAM: PORTABLE CHEST 1 VIEW COMPARISON:  Portable chest yesterday. FINDINGS: Portable AP upright view at 0923 hours. Mildly rotated to the right. Endotracheal tube tip in good position between the clavicles and carina. Enteric feeding tube courses to the left abdomen, tip not included. Stable right chest power port. Stable cardiac size and mediastinal contours. Stable lung volumes and ventilation. No pneumothorax, pulmonary edema, consolidation. Negative visible bowel gas. IMPRESSION: 1. Satisfactory endotracheal tube placement. Enteric feeding tube remains in place. 2.  No convincing acute cardiopulmonary abnormality. Electronically Signed   By: Odessa Fleming M.D.   On: 05/03/2023 12:57   DG Chest Port 1 View Result Date: 05/02/2023 CLINICAL DATA:  Shortness of breath.  History of lymphoma. EXAM: PORTABLE CHEST 1 VIEW COMPARISON:  Radiograph yesterday.  Chest CT 04/26/2023 FINDINGS: Right chest port in place. Weighted enteric tube tip below the diaphragm. Lung volumes are low. Stable heart size and mediastinal contours. Bibasilar atelectasis, left greater than right. No new airspace disease. No pleural fluid or pneumothorax. No pulmonary edema. IMPRESSION: Hypoventilatory chest with bibasilar atelectasis. Electronically Signed   By: Narda Rutherford M.D.   On: 05/02/2023  21:50   DG CHEST PORT 1 VIEW Result Date: 05/01/2023 CLINICAL DATA:  Fever EXAM: PORTABLE CHEST 1 VIEW COMPARISON:  04/27/2023 FINDINGS: Cardiomegaly. Right Port-A-Cath remains in place, unchanged. No confluent airspace opacities or effusions. No acute bony abnormality. IMPRESSION: No active disease. Electronically Signed   By: Charlett Nose M.D.   On: 05/01/2023 12:23   DG Swallowing Func-Speech Pathology Result Date: 04/28/2023 Table formatting from the original result was not included. Modified Barium Swallow Study Patient Details Name: Tyler [name] MRN: [id number] Date of Birth: Mar 13, 1950 Today's Date: 04/28/2023 HPI/PMH: HPI: 73 yo male presents to [facility] on 2/10 with LUE and LE weakness, and change in mentation. Head CT of brain shows a recurrent tumor at the craniotomy site and increased size of recurrent tumor along with a left midline shift up to 6 mm. 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 Pt's swallowing was evaluated when he was admitted in June of 2024 - he was D/Cd on a dysphagia 3 diet with thin liquids with no f/u recommended. Pt's swallowing assessed again on 04/08/23 this admission with recommendation of a regular diet and thin liquids. Hospital course complicated by concern for neck swelling, acute dysphagia, and concern for airway protection prompting intubation from 02/28-03/02. Neck CT on 04/26/23: demonstrated "No acute finding. Assessment of mucosal surfaces limited by airway  collapse in the setting of intubation.". Clinical Impression: Pt presents with a moderate-severe oral dysphagia and a mild pharyngeal dysphagia per results of MBSS completed today. Oral deficits included poor bolus awareness, oral holding, reduced labial seal resulting in anterior spillage, poor  lingual movement or initiation for oral transit, and reduced oral coordination resulting in posterior loss of trials. Oral deficits were more pronounced with nectar-thick liquid, puree, and soft solid trials. Soft solid trial was removed from oral cavity due to minimal-no mastication efforts.  Pt required multiple verbal and tactile cues to eventually initiate oral transit of nectar-thick and puree trials. Tactile cues included downward pressure of spoon on tongue multiple times. Pharyngeal dysphagia characterized by reduced hyolaryngeal elevation, brief mistiming of pharyngeal swallow initiation, reduced base of tongue retraction, reduced laryngeal vestibule closure, and reduced pharyngeal stripping. Findings: -There was x1 instance of trace-min silent aspiration of thin liquids (via straw) before the swallow. Aspiration occurred posteriorly from the pyriform sinuses due to pooling of liquids before swallow was initiated. Aspiration event was completely silent without cough, water eyes, increased RR, or any other subtle s/s of aspiration. -There was intermittent, trace, shallow, transient penetration of nectar-thick and thin liquids by cup. -There was shallow penetration without ejection of mixture of thin and nectar-thick residue towards end of study. Penetrated residue appeared to move slowly towards the airway, though remained above the vocal cords during assessment. Pt unable to follow directions for strong cough to attempt to clear residue. Diet recommendations outlined below. SLP will follow up for ongoing diet tolerance assessment and therapeutic PO trials of pureed solids to address oral deficits. Recommend continue tube feeds via Cortrak for nutritional support and medication delivery. Factors that may increase risk of adverse event in presence of aspiration Rubye Oaks & Clearance Coots 2021): Factors that may increase risk of adverse event in presence of aspiration Rubye Oaks & Clearance Coots 2021): Reduced cognitive  function; Presence of tubes (ETT, trach, NG, etc.); Weak cough; Dependence for feeding and/or oral hygiene; Inadequate oral hygiene Recommendations/Plan: Swallowing Evaluation Recommendations Swallowing Evaluation Recommendations Recommendations: PO diet PO Diet Recommendation: Clear liquid diet; Thin liquids (Level 0) (ok for any thin liquids) Liquid Administration via: Cup; No straw Medication Administration: Via alternative means Supervision: Staff to assist with self-feeding; Full supervision/cueing for swallowing strategies Swallowing strategies  : Minimize environmental distractions; Small bites/sips; Check for anterior loss Postural changes: Position pt fully upright for meals; Stay upright 30-60 min after meals Oral care recommendations: Oral care QID (4x/day); Staff/trained caregiver to provide oral care; Use suctioning for oral care Caregiver Recommendations: Have oral suction available Treatment Plan Treatment Plan Treatment recommendations: Therapy as outlined in treatment plan below Follow-up recommendations: Follow physicians's recommendations for discharge plan and follow up therapies (SLP services at next level of care) Functional status assessment: Patient has had a recent decline in their functional  status and demonstrates the ability to make significant improvements in function in a reasonable and predictable amount of time. Treatment frequency: Min 2x/week Treatment duration: 2 weeks Interventions: Aspiration precaution training; Compensatory techniques; Patient/family education; Trials of upgraded texture/liquids; Diet toleration management by SLP; Oropharyngeal exercises Recommendations Recommendations for follow up therapy are one component of a multi-disciplinary discharge planning process, led by the attending physician.  Recommendations may be updated based on patient status, additional functional criteria and insurance authorization. Assessment: Orofacial Exam: Orofacial Exam Oral Cavity:  Oral Hygiene: WFL Oral Cavity - Dentition: Adequate natural dentition Orofacial Anatomy: WFL Oral Motor/Sensory Function: Suspected cranial nerve impairment (limited assessment, though concern for oral apraxia) Anatomy: Anatomy: WFL Boluses Administered: Boluses Administered Boluses Administered: Mildly thick liquids (Level 2, nectar thick); Thin liquids (Level 0); Puree; Solid  Oral Impairment Domain: Oral Impairment Domain Lip Closure: Escape beyond mid-chin Tongue control during bolus hold: Posterior escape of greater than half of bolus Bolus preparation/mastication: Minimal chewing/mashing with majority of bolus unchewed (removed from oral cavity) Bolus transport/lingual motion: Minimal-no tongue motion Oral residue: Majority of bolus remaining Location of oral residue : Floor of mouth; Tongue Initiation of pharyngeal swallow : Pyriform sinuses  Pharyngeal Impairment Domain: Pharyngeal Impairment Domain Soft palate elevation: No bolus between soft palate (SP)/pharyngeal wall (PW) Laryngeal elevation: Partial superior movement of thyroid cartilage/partial approximation of arytenoids to epiglottic petiole Anterior hyoid excursion: Complete anterior movement Epiglottic movement: Complete inversion Laryngeal vestibule closure: Incomplete, narrow column air/contrast in laryngeal vestibule Pharyngeal stripping wave : Present - diminished Pharyngeal contraction (A/P view only): N/A Pharyngoesophageal segment opening: Complete distension and complete duration, no obstruction of flow Tongue base retraction: Trace column of contrast or air between tongue base and PPW Pharyngeal residue: Collection of residue within or on pharyngeal structures Location of pharyngeal residue: Tongue base; Valleculae  Esophageal Impairment Domain: No data recorded Pill: No data recorded Penetration/Aspiration Scale Score: Penetration/Aspiration Scale Score 1.  Material does not enter airway: Thin liquids (Level 0); Mildly thick liquids  (Level 2, nectar thick); Puree 2.  Material enters airway, remains ABOVE vocal cords then ejected out: Thin liquids (Level 0); Mildly thick liquids (Level 2, nectar thick) 3.  Material enters airway, remains ABOVE vocal cords and not ejected out: Thin liquids (Level 0); Mildly thick liquids (Level 2, nectar thick) 8.  Material enters airway, passes BELOW cords without attempt by patient to eject out (silent aspiration) : Thin liquids (Level 0) Compensatory Strategies: Compensatory Strategies Compensatory strategies: Yes Straw: Ineffective Ineffective Straw: Thin liquid (Level 0) Other(comment): Ineffective (tactile pressure with spoon in effort to initiate oral transit) Ineffective Other(comment): Mildly thick liquid (Level 2, nectar thick); Moderately thick liquid (Level 3, honey thick)   General Information: Caregiver present: No (follow up education completed on phone with daughter)  Diet Prior to this Study: NPO; Cortrak/Small bore NG tube   Temperature : Febrile   Respiratory Status: WFL   Supplemental O2: Nasal cannula (2L)   History of Recent Intubation: Yes  Behavior/Cognition: Alert; Cooperative; Pleasant mood Self-Feeding Abilities: Needs set-up for self-feeding Baseline vocal quality/speech: -- (intermittent vocal wetness) Volitional Cough: Unable to elicit Volitional Swallow: Unable to elicit Exam Limitations: No limitations Goal Planning: Prognosis for improved oropharyngeal function: Fair Barriers to Reach Goals: Cognitive deficits No data recorded Patient/Family Stated Goal: drink water Consulted and agree with results and recommendations: Patient; Family member/caregiver; Nurse Pain: Pain Assessment Pain Assessment: No/denies pain Breathing: 0 Negative Vocalization: 0 Facial Expression: 0 Body Language: 1 Consolability: 0 PAINAD Score: 1  Facial Expression: 0 Body Movements: 0 Muscle Tension: 0 Compliance with ventilator (intubated pts.): N/A Vocalization (extubated pts.): 0 CPOT Total: 0 End of  Session: Start Time:SLP Start Time (ACUTE ONLY): 0900 Stop Time: SLP Stop Time (ACUTE ONLY): 0930 Time Calculation:SLP Time Calculation (min) (ACUTE ONLY): 30 min Charges: SLP Evaluations $ SLP Speech Visit: 1 Visit SLP Evaluations $BSS Swallow: 1 Procedure $MBS Swallow: 1 Procedure SLP visit diagnosis: SLP Visit Diagnosis: Dysphagia, oropharyngeal phase (R13.12) Past Medical History: Past Medical History: Diagnosis Date  Cancer (HCC)   prostate  Follicular lymphoma (HCC) 2024  History of pulmonary embolism   Hypertension   Myelodysplastic syndrome (HCC)   PAF (paroxysmal atrial fibrillation) (HCC)   Recurrent UTI  Past Surgical History: Past Surgical History: Procedure Laterality Date  APPLICATION OF CRANIAL NAVIGATION Right 03/10/2023  Procedure: APPLICATION OF CRANIAL NAVIGATION;  Surgeon: Tressie Stalker, MD;  Location: [facility] OR;  Service: Neurosurgery;  Laterality: Right;  BIOPSY  08/21/2022  Procedure: BIOPSY;  Surgeon: Beverley Fiedler, MD;  Location: [facility] ENDOSCOPY;  Service: Gastroenterology;;  CRANIOTOMY Right 03/10/2023  Procedure: PARIETAL CRANIOTOMY;  Surgeon: Tressie Stalker, MD;  Location: [facility] OR;  Service: Neurosurgery;  Laterality: Right;  ESOPHAGOGASTRODUODENOSCOPY (EGD) WITH PROPOFOL N/A 08/21/2022  Procedure: ESOPHAGOGASTRODUODENOSCOPY (EGD) WITH PROPOFOL;  Surgeon: Beverley Fiedler, MD;  Location: [facility] ENDOSCOPY;  Service: Gastroenterology;  Laterality: N/A;  IR IMAGING GUIDED PORT INSERTION  08/26/2022  LYMPHADENECTOMY Bilateral 09/01/2019  Procedure: LYMPHADENECTOMY;  Surgeon: Sebastian Ache, MD;  Location: [facility] ORS;  Service: Urology;  Laterality: Bilateral;  ROBOT ASSISTED LAPAROSCOPIC RADICAL PROSTATECTOMY N/A 09/01/2019  Procedure: XI ROBOTIC ASSISTED LAPAROSCOPIC RADICAL PROSTATECTOMY;  Surgeon: Sebastian Ache, MD;  Location: [facility] ORS;  Service: Urology;  Laterality: N/A;  3 HRS [name] 04/28/2023, 11:06 AM  Overnight EEG with video Result Date: 04/28/2023 Charlsie Quest, MD     04/29/2023 10:44  AM Patient Name: Tyler [name] MRN: [id number] Epilepsy Attending: Charlsie Quest Referring Physician/Provider: Gordy Councilman, MD Duration: 04/27/2023 1608 to 04/28/2023 1608 Patient history: 73yo M patient with 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 EEG to evaluate for seizure Level of alertness: Awake/lethargic AEDs during EEG study: LEV, LCM Technical aspects: This EEG study was done with scalp electrodes positioned according to the 10-20 International system of electrode placement. Electrical activity was reviewed with band pass filter of 1-70Hz , sensitivity of 7 uV/mm, display speed of 28mm/sec with a 60Hz  notched filter applied as appropriate. EEG data were recorded continuously and digitally stored.  Video monitoring was available and reviewed as appropriate. Description: No clear posterior dominant rhythm was seen.  EEG showed continuous generalized and maximal right parietal 3 to 6 Hz theta-delta slowing. Lateralized periodic discharges were noted in right hemisphere, maximal right parietal region at 1 Hz, at times with overlying rhythmicity. Seizures without clinical signs were noted arising from right parietal region, average 5 seizures per hour, lasting 1 to 2 minutes each.  During seizure EEG showed 5 to 6 Hz theta slowing admixed with sharp waves and right parietal region which then involved all of right hemisphere and evolved into 2 to 3 Hz delta slowing.  IV Vimpat was administered on 04/28/2023 at around 1042. Gradually after around 1300, EEG improved and showed lateralized periodic discharges in right hemisphere, maximal right parietal region at 1hz  with overlying rhythmicity.  Hyperventilation and photic stimulation were not performed.   ABNORMALITY -Seizure without clinical signs, right parietal region -Lateralized periodic discharges with overlying rhythmicity, right hemisphere, maximal right parietal  region ( LPD+R) -Continuous slow, generalized and maximal right parietal region  IMPRESSION: This study showed seizures without clinical signs arising from right parietal region, average 5 seizures per hour lasting 1 to 2 minutes each.  IV Vimpat was administered on 04/28/2023 at around 1042.  Gradually after around 1300, EEG improved and no definite seizures were noted. Additionally there is evidence of epileptogenicity and cortical dysfunction arising from right hemisphere, maximal right parietal region likely secondary to underlying structural abnormality.  Charlsie Quest   DG CHEST PORT 1 VIEW Result Date: 04/27/2023 CLINICAL DATA:  Acute hypoxemic respiratory failure. EXAM: PORTABLE CHEST 1 VIEW COMPARISON:  Chest radiograph dated 04/26/2023. FINDINGS: The heart is enlarged. An endotracheal tube terminates in the midthoracic trachea. An enteric tube enters the stomach and terminates below the field of view. A right internal jugular central venous port catheter tip overlies the right atrium. There is mild bibasilar atelectasis/airspace disease, similar to prior exam. IMPRESSION: Mild bibasilar atelectasis/airspace disease, similar to prior exam. Electronically Signed   By: Romona Curls M.D.   On: 04/27/2023 14:57   ECHOCARDIOGRAM COMPLETE Result Date: 04/26/2023    ECHOCARDIOGRAM REPORT   Patient Name:   Tyler [name] Date of Exam: 04/26/2023 Medical Rec #:  [id number]    Height:       67.0 in Accession #:    [id number]   Weight:       184.1 lb Date of Birth:  10/13/1950    BSA:          1.952 m Patient Age:    72 years     BP:           151/88 mmHg Patient Gender: M            HR:           48 bpm. Exam Location:  Inpatient Procedure: 2D Echo, Color Doppler and Cardiac Doppler (Both Spectral and Color            Flow Doppler were utilized during procedure). Indications:    Resp Distress  History:        Patient has prior history of Echocardiogram examinations, most                 recent 10/03/2022. Chemo; Risk Factors:Hypertension.  Sonographer:    Amy Chionchio Referring Phys: [id number] MURALI  RAMASWAMY IMPRESSIONS  1. Left ventricular ejection fraction, by estimation, is 60 to 65%. The left ventricle has normal function. The left ventricle has no regional wall motion abnormalities. There is mild left ventricular hypertrophy. Left ventricular diastolic parameters are indeterminate.  2. Right ventricular systolic function is normal. The right ventricular size is normal. Tricuspid regurgitation signal is inadequate for assessing PA pressure.  3. The mitral valve is normal in structure. Trivial mitral valve regurgitation. No evidence of mitral stenosis.  4. The aortic valve is tricuspid. Aortic valve regurgitation is not visualized. No aortic stenosis is present.  5. The inferior vena cava is dilated in size with <50% respiratory variability, suggesting right atrial pressure of 15 mmHg. FINDINGS  Left Ventricle: Left ventricular ejection fraction, by estimation, is 60 to 65%. The left ventricle has normal function. The left ventricle has no regional wall motion abnormalities. Strain imaging was not performed. The left ventricular internal cavity  size was normal in size. There is mild left ventricular hypertrophy. Left ventricular diastolic parameters are indeterminate. Right Ventricle: The right ventricular size is normal. No increase in right ventricular wall thickness. Right ventricular systolic  function is normal. Tricuspid regurgitation signal is inadequate for assessing PA pressure. Left Atrium: Left atrial size was normal in size. Right Atrium: Right atrial size was normal in size. Pericardium: There is no evidence of pericardial effusion. Mitral Valve: The mitral valve is normal in structure. Trivial mitral valve regurgitation. No evidence of mitral valve stenosis. MV peak gradient, 2.4 mmHg. The mean mitral valve gradient is 1.0 mmHg. Tricuspid Valve: The tricuspid valve is normal in structure. Tricuspid valve regurgitation is trivial. Aortic Valve: The aortic valve is tricuspid. Aortic valve  regurgitation is not visualized. No aortic stenosis is present. Aortic valve mean gradient measures 3.0 mmHg. Aortic valve peak gradient measures 5.7 mmHg. Aortic valve area, by VTI measures 2.11 cm. Pulmonic Valve: The pulmonic valve was grossly normal. Pulmonic valve regurgitation is not visualized. Aorta: The aortic root and ascending aorta are structurally normal, with no evidence of dilitation. Venous: The inferior vena cava is dilated in size with less than 50% respiratory variability, suggesting right atrial pressure of 15 mmHg. IAS/Shunts: The interatrial septum was not well visualized. Additional Comments: 3D imaging was not performed.  LEFT VENTRICLE PLAX 2D LVIDd:         4.20 cm     Diastology LVIDs:         2.70 cm     LV e' medial:    5.11 cm/s LV PW:         1.10 cm     LV E/e' medial:  14.0 LV IVS:        0.90 cm     LV e' lateral:   7.94 cm/s LVOT diam:     2.20 cm     LV E/e' lateral: 9.0 LV SV:         58 LV SV Index:   30 LVOT Area:     3.80 cm  LV Volumes (MOD) LV vol d, MOD A2C: 69.9 ml LV vol d, MOD A4C: 90.8 ml LV vol s, MOD A2C: 23.8 ml LV vol s, MOD A4C: 29.9 ml LV SV MOD A2C:     46.1 ml LV SV MOD A4C:     90.8 ml LV SV MOD BP:      53.6 ml RIGHT VENTRICLE             IVC RV Basal diam:  3.10 cm     IVC diam: 2.20 cm RV S prime:     14.90 cm/s TAPSE (M-mode): 1.7 cm LEFT ATRIUM             Index        RIGHT [facility]          Index LA Vol (A2C):   31.3 ml 16.03 ml/m  RA Area:     9.52 cm LA Vol (A4C):   54.0 ml 27.66 ml/m  RA Volume:   14.30 ml 7.32 ml/m LA Biplane Vol: 43.6 ml 22.33 ml/m  AORTIC VALVE                    PULMONIC VALVE AV Area (Vmax):    2.48 cm     PV Vmax:       0.77 m/s AV Area (Vmean):   2.33 cm     PV Peak grad:  2.4 mmHg AV Area (VTI):     2.11 cm AV Vmax:           119.00 cm/s AV Vmean:          78.400 cm/s  AV VTI:            0.275 m AV Peak Grad:      5.7 mmHg AV Mean Grad:      3.0 mmHg LVOT Vmax:         77.50 cm/s LVOT Vmean:        48.100 cm/s LVOT VTI:           0.153 m LVOT/AV VTI ratio: 0.56  AORTA Ao Root diam: 3.30 cm Ao Asc diam:  3.40 cm MITRAL VALVE MV Area (PHT): 2.70 cm    SHUNTS MV Area VTI:   2.10 cm    Systemic VTI:  0.15 m MV Peak grad:  2.4 mmHg    Systemic Diam: 2.20 cm MV Mean grad:  1.0 mmHg MV Vmax:       0.77 m/s MV Vmean:      35.3 cm/s MV Decel Time: 281 msec MV E velocity: 71.70 cm/s MV A velocity: 83.10 cm/s MV E/A ratio:  0.86 Epifanio Lesches MD Electronically signed by Epifanio Lesches MD Signature Date/Time: 04/26/2023/2:18:08 PM    Final    DG CHEST PORT 1 VIEW Result Date: 04/26/2023 CLINICAL DATA:  Endotracheal tube present. EXAM: PORTABLE CHEST 1 VIEW COMPARISON:  Radiograph yesterday.  CT earlier today FINDINGS: Endotracheal tube tip 4.6 cm from the carina. Weighted enteric tube tip below the diaphragm not included in the field of view. Right chest port unchanged in position. Stable heart size and mediastinal contours. Left lung base opacity has progressed from yesterday's radiograph, subtotal lower lobe collapse on CT. Additional streaky atelectasis in the left mid lung. Right lower lobe opacity on CT has no definite radiographic correlate. No pneumothorax or large pleural effusion. IMPRESSION: 1. Endotracheal tube tip 4.6 cm from the carina. 2. Left lung base opacity has progressed from yesterday's radiograph, subtotal lower lobe collapse on this morning's CT. Additional streaky atelectasis in the left mid lung. Electronically Signed   By: Narda Rutherford M.D.   On: 04/26/2023 13:27   CT CHEST W CONTRAST Result Date: 04/26/2023 CLINICAL DATA:  Head neck cancer.  Airway compromise with intubation EXAM: CT CHEST WITH CONTRAST TECHNIQUE: Multidetector CT imaging of the chest was performed during intravenous contrast administration. RADIATION DOSE REDUCTION: This exam was performed according to the departmental dose-optimization program which includes automated exposure control, adjustment of the mA and/or kV according to  patient size and/or use of iterative reconstruction technique. CONTRAST:  OMNIPAQUE IOHEXOL 300 MG/ML  SOLN COMPARISON:  Head CT 04/16/2023 FINDINGS: Cardiovascular: Normal heart size. No pericardial effusion. Atheromatous calcification of the coronary arteries. Porta catheter with tip at the upper right atrium. Mediastinum/Nodes: Located endotracheal and enteric tube. No mass or adenopathy. Lungs/Pleura: Multi segment opacification in the lower lobes with volume loss, worse on the left. No consolidation, edema, effusion, or pneumothorax. Upper Abdomen: Enteric tube with tip at the pylorus. No acute finding Musculoskeletal: Thoracic spondylosis.  No acute finding IMPRESSION: Multi segment atelectasis in the lower lobes Electronically Signed   By: Tiburcio Pea M.D.   On: 04/26/2023 06:31   CT HEAD WO CONTRAST ( ) Result Date: 04/26/2023 CLINICAL DATA:  Mental status change.  History of CNS lymphoma. EXAM: CT HEAD WITHOUT CONTRAST TECHNIQUE: Contiguous axial images were obtained from the base of the skull through the vertex without intravenous contrast. RADIATION DOSE REDUCTION: This exam was performed according to the departmental dose-optimization program which includes automated exposure control, adjustment of the mA and/or kV according to patient size and/or  use of iterative reconstruction technique. COMPARISON:  Brain MRI 04/24/2023 FINDINGS: Brain: Cortical edema at the right insula correlating with abnormality on prior brain MRI. Small area of blood products in the parasagittal right parietal lobe with adjacent swelling, operative site. No evidence of interval infarct, hydrocephalus, or collection. Vascular: No hyperdense vessel or unexpected calcification. Skull: Unremarkable right parietal craniotomy site Sinuses/Orbits: Negative IMPRESSION: No acute finding when compared to 04/24/2023 brain MRI. Electronically Signed   By: Tiburcio Pea M.D.   On: 04/26/2023 05:09   CT SOFT TISSUE NECK W  CONTRAST Result Date: 04/26/2023 CLINICAL DATA:  Soft tissue infection suspected. Neck swelling and airway compromise EXAM: CT NECK WITH CONTRAST TECHNIQUE: Multidetector CT imaging of the neck was performed using the standard protocol following the bolus administration of intravenous contrast. RADIATION DOSE REDUCTION: This exam was performed according to the departmental dose-optimization program which includes automated exposure control, adjustment of the mA and/or kV according to patient size and/or use of iterative reconstruction technique. CONTRAST:  OMNIPAQUE IOHEXOL 300 MG/ML  SOLN COMPARISON:  04/16/2023 head CT FINDINGS: Pharynx and larynx: The airway is collapsed around endotracheal tube and enteric tube with fluid layering in the nasopharynx. This limits assessment of the mucosal surfaces, no evidence of submucosal edema. Salivary glands: No inflammation, mass, or stone. Thyroid: Normal. Lymph nodes: None enlarged or heterogeneous. Vascular: No acute finding Limited intracranial: Negative Visualized orbits: Negative Mastoids and visualized paranasal sinuses: Clear Skeleton: No acute or aggressive finding. Upper chest: Clear apical lungs. IMPRESSION: No acute finding. Assessment of mucosal surfaces limited by airway collapse in the setting of intubation. Electronically Signed   By: Tiburcio Pea M.D.   On: 04/26/2023 05:03   DG Abd 1 View Result Date: 04/26/2023 CLINICAL DATA:  Check gastric catheter placement EXAM: ABDOMEN - 1 VIEW COMPARISON:  None Available. FINDINGS: Weighted feeding catheter is noted in the distal stomach directed towards the pole or is. No free air is seen. No obstructive changes are noted. IMPRESSION: Feeding catheter as described. Electronically Signed   By: Alcide Clever M.D.   On: 04/26/2023 02:11   DG CHEST PORT 1 VIEW Result Date: 04/25/2023 CLINICAL DATA:  Check endotracheal tube placement EXAM: PORTABLE CHEST 1 VIEW COMPARISON:  Film from earlier in the same day.  FINDINGS: Endotracheal tube is noted 5.3 cm above the carina. Gastric catheter extends into the stomach. Right chest wall port is again seen. Cardiac shadow is stable. No focal infiltrate is noted. Mild basilar atelectasis is seen. IMPRESSION: Tubes and lines as described. Electronically Signed   By: Alcide Clever M.D.   On: 04/25/2023 21:44   DG Chest Port 1 View Result Date: 04/25/2023 CLINICAL DATA:  Altered mental status EXAM: PORTABLE CHEST 1 VIEW COMPARISON:  04/10/2023 FINDINGS: Cardiac shadow is enlarged but stable. Right chest wall port is again seen. Mild basilar atelectasis is seen. No bony abnormality is noted. IMPRESSION: Mild basilar atelectasis. Electronically Signed   By: Alcide Clever M.D.   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 History of B-cell lymphoma EXAM: MRI HEAD WITHOUT AND WITH CONTRAST TECHNIQUE: Multiplanar, multiecho pulse sequences of the brain and surrounding structures were obtained without and with intravenous contrast. CONTRAST:  8mL GADAVIST GADOBUTROL 1 MMOL/ML IV SOLN COMPARISON:  Comparison made with previous MRI from 04/07/2023 as well as earlier studies. FINDINGS: Brain: Postoperative changes from prior right parietal craniotomy again seen. Patient's  known recurrent lymphoma at the right parietal convexity again seen. Size of the lesion is markedly decreased from prior, now measuring 2.9 x 3.5 x 2.3 cm (series 18, image 10). Superimposed susceptibility artifact consistent with blood products. Persistent but improved vasogenic edema within the adjacent posterior right cerebral hemisphere, with improved/resolved mass effect on the right lateral ventricle. Previously seen right to left shift has resolved. Findings consistent with interval response to therapy. Now seen is restricted diffusion involving the right insular cortex and overlying posterior right  frontal region (series 5, images 26, 30). Associated gyral swelling with T2/FLAIR signal abnormality within this location (series 9, image 28). No associated enhancement. 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 No other mass lesion or abnormal enhancement. No other acute or subacute infarct. No hydrocephalus or extra-axial fluid collection. Underlying cerebral white matter disease noted, stable. Vascular: Major intracranial vascular flow voids are maintained. Skull and upper cervical spine: Craniocervical junction within normal limits. Bone marrow signal intensity within normal limits. Post craniotomy changes at the right parietal calvarium without adverse features. Sinuses/Orbits: Globes and orbital soft tissues within normal limits. Mild mucosal thickening present about the ethmoidal air cells and maxillary sinuses. No mastoid effusion. Other: None. IMPRESSION: 1. Marked interval decrease in size of right parietal mass/lymphoma, now measuring 2.9 x 3.5 x 2.3 cm (previously 5.4 x 3.6 x 4.4 cm), consistent with interval response to therapy. Persistent but improved vasogenic edema within the adjacent posterior right cerebral hemisphere, with interval resolution of previously seen right-to-left midline shift. 2. 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 Correlation with symptomatology and EEG recommended. Evolving changes of subacute ischemia would be the primary differential consideration, but are felt to be less likely. Electronically Signed   By: Rise Mu M.D.   On: 04/25/2023 05:24      IMPRESSION/PLAN: This is a very pleasant *** year old *** with metastatic disease to the brain.  I had a lengthy discussion with the patient after reviewing their MRI results with them.  We spoke about whole brain radiotherapy versus stereotactic radiosurgery to the brain. We spoke about the differing  risks benefits and side effects of both of these treatments. During part of our discussion, we spoke about the hair loss, fatigue and cognitive effects that can result from whole brain radiotherapy.  Additionally, we spoke about radionecrosis that can result from stereotactic radiosurgery. I explained that whole brain radiotherapy is more comprehensive and therefore can decrease the chance of recurrences elsewhere in the brain, while stereotactic radiosurgery only treats the areas of gross disease while sparing the rest of the brain parenchyma.  After lengthy discussion, the patient would like to proceed with stereotactic brain radiosurgery to their metastatic disease. They will meet with neurosurgery in the near future to discuss this further; a neurosurgeon will participate in their case.  CT simulation will take place on *** and treatment on ***.  I plan to deliver *** Gy in 1 fraction to ***.  On date of service, in total, I spent *** minutes on this encounter. Patient was seen in person.   __________________________________________   Lonie Peak, MD  This document serves as a record of services personally performed by Antony Blackbird, MD. It was created on his behalf by Herbie Saxon, a trained medical scribe. The creation of this record is based on the scribe's personal observations and the provider's statements to them. This document has been checked  and approved by the attending provider.

## 2023-05-13 NOTE — Progress Notes (Addendum)
 Nutrition Follow-up  DOCUMENTATION CODES:  Not applicable  INTERVENTION:  Tube feeding via Cortrak tube: Osmolite 1.5 @ 55 ml/hr (1320 ml per day) Prosource TF20 60 ml BID Provides 2140 kcal, 122 gm protein, 1003 ml free water daily Banatrol TID to provide 5g of soluble fiber per packet  NUTRITION DIAGNOSIS:  Inadequate oral intake related to  (seizures) as evidenced by NPO status. - Ongoing.   GOAL:  Patient will meet greater than or equal to 90% of their needs - Met with TF at goal   MONITOR:  TF tolerance, I & O's  REASON FOR ASSESSMENT:  Consult Enteral/tube feeding initiation and management (trickles)  ASSESSMENT:  73 y.o. male with PMH prostate cancer s/p prostatectomy in 2021, pulmonary embolism, hepatitis B exposure, and high-grade large B-cell lymphoma and completed 6 cycles of R-CHOP (chemo) with systemic remission in the past. 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  2/25 - admitted to [facility] 2/28 - intubated 3/1 - s/p cortrak placement (at [facility]); tip gastric per xray 3/2 - transferred to [facility], extubated 3/8 - re-intubated 3/14 - extubated 3/17 - BSE, NPO  Pt sitting up in bed sleeping at the time of assessment, no family present at this time. TF infusing at goal of 80mL/h. Would not open mouth to participate with SLP today.    Noted that loose stools present in flowsheet, will add fiber to aid in bulking stool  Current weight: 84.9 kg Admission Weight: 84.8 kg  Nutritionally Relevant Medications: Scheduled Meds:  cholecalciferol  2,000 Units Per Tube Daily   vitamin B-12  1,000 mcg Per Tube Daily   dexamethasone  4 mg Per Tube Q12H   PROSource TF20  60 mL Per Tube BID   folic acid  1 mg Per Tube Daily   insulin aspart  0-15 Units Subcutaneous Q4H   thiamine  100 mg Per Tube Daily   Continuous Infusions:  OSMOLITE 1.5 CAL 1,000 mL (05/12/23  1740)   ondansetron (ZOFRAN) IV     PRN Meds: alum & mag hydroxide-simeth, ondansetron, polyethylene glycol, senna-docusate  Labs Reviewed: CBG ranges from 85-153 mg/dL over the last 24 hours HgbA1c 5.2% (2/12)  Diet Order:   Diet Order             Diet NPO time specified  Diet effective now                   EDUCATION NEEDS:  Not appropriate for education at this time  Skin:  Skin Assessment: Reviewed RN Assessment  Last BM:  3/18 - type 7  Height:  Ht Readings from Last 1 Encounters:  05/08/23 5' 7.01" (1.702 m)    Weight:  Wt Readings from Last 1 Encounters:  05/13/23 87 kg    Ideal Body Weight:  67.27 kg  BMI:  Body mass index is 30.03 kg/m.  Estimated Nutritional Needs:  Kcal:  2100-2350 kcals Protein:  115-125g Fluid:  >/= 2.1L   Greig Castilla, RD, LDN Registered Dietitian II Please reach out via secure chat Weekend on-call pager # available in [facility]

## 2023-05-13 NOTE — Progress Notes (Signed)
 NEUROLOGY CONSULT FOLLOW UP NOTE   Date of service: May 13, 2023 Patient Name: Tyler [name] MRN:  [id number] DOB:  October 11, 1950  Interval Hx/subjective   Patient extubated and following commands No electrographic seizures on EEG Stroke workup completed Vitals   Vitals:   05/13/23 0500 05/13/23 0753 05/13/23 1124 05/13/23 1538  BP:  (!) 135/99 135/83 138/88  Pulse:  89 94 84  Resp:   20 (!) 22  Temp:  98.7 F (37.1 C) 98.9 F (37.2 C) 98.6 F (37 C)  TempSrc:  Oral Oral Oral  SpO2:  99% 95% 100%  Weight: 87 kg     Height:         Body mass index is 30.03 kg/m.  Exam   Gen: awake, alert, following commands Speech: severe dysarthria CN: PERRL, EOMI, VFF, prominent L facial droop, hearing intact to voice Motor & sensory: mild L sided weakness, improved from prior   Medications  Current Facility-Administered Medications:    acetaminophen (TYLENOL) tablet 650 mg, 650 mg, Per Tube, Q4H PRN, Kalman Shan, MD, 650 mg at 05/11/23 2151   alum & mag hydroxide-simeth (MAALOX/MYLANTA) 200-200-20 MG/5ML suspension 60 mL, 60 mL, Per Tube, Q4H PRN, Kalman Shan, MD, 60 mL at 05/11/23 0000   aspirin suppository 300 mg, 300 mg, Rectal, Daily, Samtani, Jai-Gurmukh, MD, 300 mg at 05/13/23 1358   Chlorhexidine Gluconate Cloth 2 % PADS 6 each, 6 each, Topical, Nightly, Ranee Gosselin, MD, 6 each at 05/13/23 0943   cholecalciferol (VITAMIN D3) 25 MCG (1000 UNIT) tablet 2,000 Units, 2,000 Units, Per Tube, Daily, Kalman Shan, MD, 2,000 Units at 05/13/23 0940   cyanocobalamin (VITAMIN B12) tablet 1,000 mcg, 1,000 mcg, Per Tube, Daily, Luciano Cutter, MD, 1,000 mcg at 05/13/23 0942   dexamethasone (DECADRON) tablet 4 mg, 4 mg, Per Tube, Q12H, Johney Maine, MD, 4 mg at 05/13/23 0941   enoxaparin (LOVENOX) injection 40 mg, 40 mg, Subcutaneous, Q24H, Candise Che, Gautam Kishore, MD, 40 mg at 05/12/23 2131   feeding supplement (OSMOLITE 1.5 CAL) liquid 1,000 mL, 1,000 mL, Per  Tube, Continuous, Luciano Cutter, MD, Last Rate: 55 mL/hr at 05/13/23 1401, 1,000 mL at 05/13/23 1401   feeding supplement (PROSource TF20) liquid 60 mL, 60 mL, Per Tube, BID, Luciano Cutter, MD, 60 mL at 05/13/23 0940   fiber supplement (BANATROL TF) liquid 60 mL, 60 mL, Per Tube, TID, Rhetta Mura, MD, 60 mL at 05/13/23 1633   folic acid (FOLVITE) tablet 1 mg, 1 mg, Per Tube, Daily, Luciano Cutter, MD, 1 mg at 05/13/23 0942   gabapentin (NEURONTIN) 250 MG/5ML solution 100 mg, 100 mg, Per Tube, Q8H, Mannam, Praveen, MD, 100 mg at 05/13/23 1358   hydrALAZINE (APRESOLINE) tablet 50 mg, 50 mg, Per Tube, Q8H PRN, Luciano Cutter, MD   hydrocortisone (ANUSOL-HC) 2.5 % rectal cream 1 Application, 1 Application, Rectal, BID PRN, Johney Maine, MD   ibuprofen (ADVIL) 100 MG/5ML suspension 200 mg, 200 mg, Per Tube, Q6H PRN, Kalman Shan, MD, 200 mg at 05/09/23 1456   insulin aspart (novoLOG) injection 0-15 Units, 0-15 Units, Subcutaneous, Q4H, Luciano Cutter, MD, 3 Units at 05/13/23 1631   lacosamide (VIMPAT) tablet 200 mg, 200 mg, Per Tube, BID, Charlsie Quest, MD, 200 mg at 05/13/23 0942   levETIRAcetam (KEPPRA) 100 MG/ML solution 2,000 mg, 2,000 mg, Per Tube, BID, Lindie Spruce O, MD, 2,000 mg at 05/13/23 0940   lidocaine (LMX) 4 % cream, , Topical, TID PRN, [place],  Hubbard Hartshorn, NP   LORazepam (ATIVAN) injection 2 mg, 2 mg, Intravenous, Q4H PRN, Charlsie Quest, MD   magic mouthwash w/lidocaine, 5 mL, Oral, QID PRN, Johney Maine, MD   ondansetron [facility]) tablet 4-8 mg, 4-8 mg, Per Tube, Q8H PRN, 8 mg at 05/12/23 2141 **OR** ondansetron (ZOFRAN-ODT) disintegrating tablet 4-8 mg, 4-8 mg, Oral, Q8H PRN **OR** ondansetron (ZOFRAN) injection 4 mg, 4 mg, Intravenous, Q8H PRN **OR** ondansetron (ZOFRAN) 8 mg in sodium chloride 0.9 % 50 mL IVPB, 8 mg, Intravenous, Q8H PRN, Leander Rams, [employer]   Oral care mouth rinse, 15 mL, Mouth Rinse, 4 times per day, Rhetta Mura, MD, 15 mL at 05/13/23 1635   Oral care mouth rinse, 15 mL, Mouth Rinse, PRN, Rhetta Mura, MD   polyethylene glycol (MIRALAX / GLYCOLAX) packet 17 g, 17 g, Per Tube, Daily PRN, Leander Rams, RPH   scopolamine (TRANSDERM-SCOP) 1 MG/3DAYS 1.5 mg, 1 patch, Transdermal, Q72H, Agarwala, Ravi, MD, 1.5 mg at 05/12/23 1341   senna-docusate (Senokot-S) tablet 1 tablet, 1 tablet, Per Tube, QHS PRN, Alphia Moh D, RPH   sodium chloride flush (NS) 0.9 % injection 10-40 mL, 10-40 mL, Intracatheter, Q12H, Johney Maine, MD, 10 mL at 05/13/23 0943   sodium chloride flush (NS) 0.9 % injection 10-40 mL, 10-40 mL, Intracatheter, PRN, Johney Maine, MD, 30 mL at 04/27/23 0540   sodium chloride flush (NS) 0.9 % injection 3-10 mL, 3-10 mL, Intravenous, Q12H, Agarwala, Ravi, MD, 10 mL at 05/12/23 2138   sodium chloride flush (NS) 0.9 % injection 3-10 mL, 3-10 mL, Intravenous, PRN, Lynnell Catalan, MD   tenofovir alafenamide (VEMLIDY) tablet 25 mg, 25 mg, Per Tube, Daily, Danelle Earthly, MD, 25 mg at 05/13/23 8657   thiamine (VITAMIN B1) tablet 100 mg, 100 mg, Per Tube, Daily, Luciano Cutter, MD, 100 mg at 05/13/23 0941  Labs and Diagnostic Imaging   CBC:  Recent Labs  Lab 05/11/23 0500 05/12/23 1017  WBC 7.4 7.7  NEUTROABS  --  5.1  HGB 11.8* 12.0*  HCT 35.3* 36.5*  MCV 90.5 90.3  PLT 267 284    Basic Metabolic Panel:  Lab Results  Component Value Date   NA 136 05/12/2023   K 4.3 05/12/2023   CO2 27 05/12/2023   GLUCOSE 105 (H) 05/12/2023   BUN 21 05/12/2023   CREATININE 0.71 05/12/2023   CALCIUM 8.6 (L) 05/12/2023   GFRNONAA >60 05/12/2023   GFRAA >60 09/11/2019   Lipid Panel: No results found for: "LDLCALC" HgbA1c:  Lab Results  Component Value Date   HGBA1C 5.2 04/09/2023   Urine Drug Screen: No results found for: "LABOPIA", "COCAINSCRNUR", "LABBENZ", "AMPHETMU", "THCU", "LABBARB"  Alcohol Level No results found for: "ETH" INR  Lab Results   Component Value Date   INR 1.2 04/07/2023   APTT  Lab Results  Component Value Date   APTT 28 07/29/2022   AED levels: No results found for: "PHENYTOIN", "ZONISAMIDE", "LAMOTRIGINE", "LEVETIRACETA"  CT angio Head and Neck with contrast(Personally reviewed): CTA H&N 1. No emergent large vessel occlusion or high-grade stenosis of the intracranial arteries. 2. Hypoattenuation throughout much of the right hemisphere, corresponding to the area of hyperintense T2-weighted signal on recent MRI. No intracranial hemorrhage.  MRI Brain(Personally reviewed): 1. Resolving diffusion abnormality within the posterior right MCA territory. 2. Increased extent of hyperintense T2-weighted signal in the right hemisphere. This could indicate progression of lymphoma, or confluence of post-ischemic/post-ictal edema cytotoxic edema and lymphoma-related vasogenic edema. Compared  to 04/07/2023, however, the area of abnormal signal has decreased. 3. Unchanged appearance of intraparenchymal hemorrhage in the posterior right parietal lobe. **See plan below for my detailed personal read of this study  TTE performed 04/26/23 showed normal LVEF, no intracardiac clot or other sig abnl. No indication to repeat it.   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  His seizures are improved on current regimen of vimpat, keppra, and gabapentin. He has been seizure free by EEG for >24 hrs.   MRI brain 3/11 personal review showed a different pattern of restricted  diffusion and edema that we are concerned represents MCA branch infarction.  The distribution does involve the anterior temporal lobe as well which would be unusual as well as thalamic involvement so I think that we are dealing with multiple etiologies for the current appearance.  Part of this may be postictal change.  However the isolation of the cytotoxic edema to a MCA branch distribution is highly suggestive that he has had an ischemic stroke in addition to his seizures. Stroke workup is now completed, see results above.   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 Given his anemia I will start him on daily aspirin 81mg  monotherapy. - Please check LDL and add statin if indicated to achieve goal LDL <70 - STAT head CT for any change in neuro exam - Tele - PT/OT/SLP - Stroke education - Amb referral to neurology upon discharge   # Seizures, now controlled -Continue Vimpat  200 mg twice daily -Continue Keppra 2000 mg twice daily -Continue gabapentin 100 mg 3 times daily -D/c LTM  No further inpatient neurologic workup recommended.  Neurology to sign off but please reengage if additional neurologic concerns arise. D/w Dr. Denese Killings. ______________________________________________________________________   Signed, Jefferson Fuel, MD [facility]

## 2023-05-13 NOTE — Progress Notes (Signed)
 Physical Therapy Treatment Patient Details Name: Tyler [name] MRN: [id number] DOB: 20-May-1950 Today's Date: 05/13/2023   History of Present Illness 73 yo male presents on 2/25 for second cycle of high dose methotrexate with leucovorin rescue. 2/27 MRI showed marked interval decrease in size of right parietal mass/lymphoma and new diffusion signal abnormality involving the right insular cortex and overlying right frontal lobe, favored to reflect sequelae of seizure. 2/28 transferred to ICU and intubated. 3/2 extubated. 3/8 with seizures and need for airway protection so re-intubated and continued to have breakthrough seizures; extubated 3/14. EEG with evidence of epileptogenicity arising from R hemisphere, maximal R parietal region and cortical dysfunction in R hemisphere. PMH: CNS relapse of large B-cell lymphoma, partial seizure, paroxysmal atrial fib not on anticoagulation due to bleeding risk, prostate cancer status post prostatectomy 2021, pulmonary embolism.    PT Comments  Patient resting in bed having just completed bath with NT assistance. Pt not making any effort to speak today with exception of greeting video interpreter at start. Pt required max/Total +2 assit for all aspects of bed mobility and to maintain seated balance at EOB. Pt able to sit up for ~3 minutes but heavy posterior and Lt lean today limiting safety and pt returned to supine. Pt able to follow cues to complete LE exercises in supine with good initiation on Rt and minimal initiation on Lt LE requiring AAROM. EOS pt repositioned in partial chair position, Alarm on and call bell within reach. Will continue to progress pt as able.    If plan is discharge home, recommend the following: A lot of help with walking and/or transfers;A lot of help with bathing/dressing/bathroom   Can travel by private vehicle     No  Equipment Recommendations  Other (comment) (defer to next venue)    Recommendations for Other Services Rehab  consult     Precautions / Restrictions Precautions Precautions: Fall;Other (comment) Recall of Precautions/Restrictions: Impaired Precaution/Restrictions Comments: cortrak, catheter Restrictions Weight Bearing Restrictions Per Provider Order: No     Mobility  Bed Mobility Overal bed mobility: Needs Assistance Bed Mobility: Supine to Sit, Sit to Supine     Supine to sit: Total assist, +2 for physical assistance, +2 for safety/equipment Sit to supine: Total assist, +2 for physical assistance, +2 for safety/equipment   General bed mobility comments: no initiation or effort to assist from pt, Max/total to move to Lt side, max assist to place Rt hand on rail for support. Max+2 to raise trunk upright. heavy posterior and Lt lean sitting EOB. Total assist +2 to return to supine due to posterior lean.    Transfers                   General transfer comment: unsafe to progress today    Ambulation/Gait                   Stairs             Wheelchair Mobility     Tilt Bed    Modified Rankin (Stroke Patients Only)       Balance Overall balance assessment: Needs assistance Sitting-balance support: Bilateral upper extremity supported, Feet supported Sitting balance-Leahy Scale: Poor Sitting balance - Comments: max assist for posterior lean Postural control: Posterior lean, Left lateral lean                                  Communication Communication  Communication: Impaired Factors Affecting Communication: Reduced clarity of speech;Difficulty expressing self;Non - [place] speaking, interpreter not available (vidoe interpreter  "Satie" #[id number])  Cognition Arousal: Alert Behavior During Therapy: Flat affect   PT - Cognitive impairments: Difficult to assess Difficult to assess due to: Impaired communication                     PT - Cognition Comments: no effort to speak today wtih exception of "jambo" to interpreter on initial  greeting. pt with garbled and gargly sound due to saliva/secretions. suction provided. Following commands: Impaired Following commands impaired: Follows one step commands inconsistently, Follows one step commands with increased time    Cueing Cueing Techniques: Verbal cues, Gestural cues, Tactile cues, Visual cues  Exercises General Exercises - Lower Extremity Short Arc Quad: Both, AROM, AAROM, 10 reps, Supine Heel Slides: PROM, Left, 10 reps, Supine Hip ABduction/ADduction: PROM, Left, 10 reps, Supine Hip Flexion/Marching: PROM, Left, 10 reps, Supine    General Comments        Pertinent Vitals/Pain Pain Assessment Pain Assessment: Faces Faces Pain Scale: No hurt Breathing: normal Negative Vocalization: none Facial Expression: smiling or inexpressive Body Language: relaxed Consolability: no need to console PAINAD Score: 0 Pain Intervention(s): Monitored during session    Home Living                          Prior Function            PT Goals (current goals can now be found in the care plan section) Acute Rehab PT Goals Patient Stated Goal: none stated PT Goal Formulation: With patient Time For Goal Achievement: 05/24/23 Potential to Achieve Goals: Fair Progress towards PT goals: Progressing toward goals    Frequency    Min 3X/week      PT Plan Current plan remains appropriate    Co-evaluation              AM-PAC PT "6 Clicks" Mobility   Outcome Measure  Help needed turning from your back to your side while in a flat bed without using bedrails?: Total Help needed moving from lying on your back to sitting on the side of a flat bed without using bedrails?: Total Help needed moving to and from a bed to a chair (including a wheelchair)?: Total Help needed standing up from a chair using your arms (e.g., wheelchair or bedside chair)?: Total Help needed to walk in hospital room?: Total Help needed climbing 3-5 steps with a railing? : Total 6  Click Score: 6    End of Session   Activity Tolerance: Patient limited by fatigue Patient left: in bed;with call bell/phone within reach;with bed alarm set (chair position) Nurse Communication: Mobility status;Need for lift equipment PT Visit Diagnosis: Unsteadiness on feet (R26.81);Other abnormalities of gait and mobility (R26.89);Other symptoms and signs involving the nervous system (R29.898)     Time: 9528-4132 PT Time Calculation (min) (ACUTE ONLY): 21 min  Charges:    $Therapeutic Activity: 8-22 mins PT General Charges $$ ACUTE PT VISIT: 1 Visit                      Wynn Maudlin, DPT Acute Rehabilitation Services Office [phone]  05/13/23 10:53 AM

## 2023-05-13 NOTE — Progress Notes (Signed)
 [facility] ROUNDING NOTE  Tyler [name] OZH:[id number]  DOB: 16-Feb-1951  DOA: 04/22/2023  PCP: Nechama Guard, FNP  05/13/2023,3:01 PM  LOS: 21 days    Code Status: Full code   from: Home current Dispo: Unclear   73 year old male Prostate cancer with prostatectomy 2021, bilateral PE after surgery at that time Lumbosacral stenosis and lower back pain manage conservtively Atrial fibrillation not on anticoagulation secondary to high bleeding risk Chronic hepatitis B tenofovir high-grade B-cell lymphoma Resection performed 03/10/2023 complicated by pancytopenia tumor lysis with recurrence and admitted again 2/10 through 2/17-received high-dose methotrexate-is under care of Dr. Colonel Bald completed 6 cycles of R-CHOP with remission of the parenchymal lesion receiving high-dose methotrexate cycle 2 to start  Patient was readmitted on 2/25 for second cycle of methotrexate/leucovorin and was noted to have some left-sided weakness as well as partial sensory seizures and was on Keppra from the prior admission  2/27-marked interval decrease in parietal mass 3 x 3.5 x 2.3 with vasogenic edema and persistent diffusion abnormality right insular cortex stroke 2/28 witnessed to have left neck swelling twitching left side confused and was intubated 3/1 core track placed intubated 3/2 extubated on continuous EEG 3/3 continued focal seizures 3/14 extubated    Plan  Acute ischemic stroke Underlying focal seizures secondary to cerebral edema Not really communicating with me at this time-does seem to understand-continue on Decadron 4 mg every 12 ,Ativan 2 mg every 4 as needed seizure IV, Vimpat 200 twice daily, Keppra 2 g twice daily, gabapentin 100 every 8 He is on aspirin 81 but we had to sub that for ASA rectal 300 He has a dense dysarthria and significant left-sided weakness he is unable to move his left upper extremity Will require extensive therapy and reevaluation with SLP--continue Osmolite feeds @  55cc/h  Likely aspiration prior to admission Started on Zosyn on 3/13 would complete 5 days on 3/17 Repeat chest x-ray a.m.  High-grade B-cell lymphoma with resection and recent chemo Recently given methotrexate Continues on Decadron 4 every 12 IV and antiepileptics as above XRT oncology has been consulted by med onc dr Candise Che to assess if Radiation will help with cytotoxic edema---await discussion  Atrial fibrillation not on anticoagulation secondary to high bleeding risk Not on rate control or anticoagulation despite dual indications  Previous PEs See above discussion  Prostate cancer  Hepatitis B Continue tenofovir 25 daily  Called daughter Karena Addison, a NP   DVT prophylaxis: Lovenox  Status is: Inpatient Remains inpatient appropriate because:   Unclear disposition Subjective:  Nonverbal follow commands moves RUE Continues on NG feeds  Objective + exam Vitals:   05/13/23 0400 05/13/23 0500 05/13/23 0753 05/13/23 1124  BP: (!) 127/92  (!) 135/99 135/83  Pulse: 89  89 94  Resp: 18     Temp: 98 F (36.7 C)  98.7 F (37.1 C) 98.9 F (37.2 C)  TempSrc:   Oral Oral  SpO2: 100%  99% 95%  Weight:  87 kg    Height:       Filed Weights   05/11/23 0500 05/12/23 0500 05/13/23 0500  Weight: 86 kg 87.2 kg 87 kg    Examination: Relatively ill-appearing black male no distress-Cotrack in place EOMI NCAT some neglect on left side S1-S2 no murmur on monitors sinus rhythm Abdomen soft Power 4/5 RUE, weak elsewhere ~ 3/5 power Moving right arm fairly well Does not follow commands adequately to test reflexes and/or coordination  Data Reviewed: reviewed   CBC    Component Value  Date/Time   WBC 7.7 05/12/2023 1017   RBC 4.04 (L) 05/12/2023 1017   HGB 12.0 (L) 05/12/2023 1017   HGB 14.4 04/18/2023 1343   HCT 36.5 (L) 05/12/2023 1017   PLT 284 05/12/2023 1017   PLT 119 (L) 04/18/2023 1343   MCV 90.3 05/12/2023 1017   MCH 29.7 05/12/2023 1017   MCHC 32.9 05/12/2023 1017    RDW 13.3 05/12/2023 1017   LYMPHSABS 1.5 05/12/2023 1017   MONOABS 0.9 05/12/2023 1017   EOSABS 0.0 05/12/2023 1017   BASOSABS 0.0 05/12/2023 1017      Latest Ref Rng & Units 05/12/2023   10:17 AM 05/11/2023    5:00 AM 05/09/2023    4:07 AM  CMP  Glucose 70 - 99 mg/dL 425  956  387   BUN 8 - 23 mg/dL 21  22  28    Creatinine 0.61 - 1.24 mg/dL 5.64  3.32  9.51   Sodium 135 - 145 mmol/L 136  134  130   Potassium 3.5 - 5.1 mmol/L 4.3  4.2  4.2   Chloride 98 - 111 mmol/L 98  98  98   CO2 22 - 32 mmol/L 27  29  24    Calcium 8.9 - 10.3 mg/dL 8.6  8.4  8.0   Total Protein 6.5 - 8.1 g/dL 5.6   5.2   Total Bilirubin 0.0 - 1.2 mg/dL 0.6   0.4   Alkaline Phos 38 - 126 U/L 66   56   AST 15 - 41 U/L 34   29   ALT 0 - 44 U/L 77   43     Scheduled Meds:  aspirin  300 mg Rectal Daily   Chlorhexidine Gluconate Cloth  6 each Topical Nightly   cholecalciferol  2,000 Units Per Tube Daily   vitamin B-12  1,000 mcg Per Tube Daily   dexamethasone  4 mg Per Tube Q12H   enoxaparin (LOVENOX) injection  40 mg Subcutaneous Q24H   feeding supplement (PROSource TF20)  60 mL Per Tube BID   fiber supplement (BANATROL TF)  60 mL Per Tube TID   folic acid  1 mg Per Tube Daily   gabapentin  100 mg Per Tube Q8H   insulin aspart  0-15 Units Subcutaneous Q4H   lacosamide  200 mg Per Tube BID   levETIRAcetam  2,000 mg Per Tube BID   mouth rinse  15 mL Mouth Rinse 4 times per day   scopolamine  1 patch Transdermal Q72H   sodium chloride flush  10-40 mL Intracatheter Q12H   sodium chloride flush  3-10 mL Intravenous Q12H   tenofovir alafenamide  25 mg Per Tube Daily   thiamine  100 mg Per Tube Daily   Continuous Infusions:  feeding supplement (OSMOLITE 1.5 CAL) 1,000 mL (05/13/23 1401)   ondansetron (ZOFRAN) IV      Time 45  Rhetta Mura, MD  [facility]

## 2023-05-14 ENCOUNTER — Ambulatory Visit
Admit: 2023-05-14 | Discharge: 2023-05-14 | Disposition: A | Discharge status: Home / Self Care | Attending: Radiation Oncology | Admitting: Radiation Oncology

## 2023-05-14 ENCOUNTER — Inpatient Hospital Stay (HOSPITAL_COMMUNITY)

## 2023-05-14 DIAGNOSIS — C8339 Primary central nervous system lymphoma: Secondary | ICD-10-CM | POA: Diagnosis not present

## 2023-05-14 DIAGNOSIS — Z7189 Other specified counseling: Secondary | ICD-10-CM | POA: Diagnosis not present

## 2023-05-14 DIAGNOSIS — Z515 Encounter for palliative care: Secondary | ICD-10-CM | POA: Diagnosis not present

## 2023-05-14 DIAGNOSIS — C8510 Unspecified B-cell lymphoma, unspecified site: Secondary | ICD-10-CM | POA: Diagnosis not present

## 2023-05-14 DIAGNOSIS — I63519 Cerebral infarction due to unspecified occlusion or stenosis of unspecified middle cerebral artery: Secondary | ICD-10-CM | POA: Diagnosis not present

## 2023-05-14 DIAGNOSIS — G40909 Epilepsy, unspecified, not intractable, without status epilepticus: Secondary | ICD-10-CM | POA: Diagnosis not present

## 2023-05-14 DIAGNOSIS — C8338 Diffuse large B-cell lymphoma, lymph nodes of multiple sites: Secondary | ICD-10-CM

## 2023-05-14 DIAGNOSIS — G936 Cerebral edema: Secondary | ICD-10-CM | POA: Diagnosis not present

## 2023-05-14 LAB — GLUCOSE, CAPILLARY
Glucose-Capillary: 103 mg/dL — ABNORMAL HIGH (ref 70–99)
Glucose-Capillary: 109 mg/dL — ABNORMAL HIGH (ref 70–99)
Glucose-Capillary: 130 mg/dL — ABNORMAL HIGH (ref 70–99)
Glucose-Capillary: 132 mg/dL — ABNORMAL HIGH (ref 70–99)
Glucose-Capillary: 94 mg/dL (ref 70–99)
Glucose-Capillary: 97 mg/dL (ref 70–99)

## 2023-05-14 MED ORDER — THIAMINE HCL 100 MG/ML IJ SOLN
100.0000 mg | Freq: Every day | INTRAMUSCULAR | Status: DC
  Administered 2023-05-14 – 2023-05-22 (×9): 100 mg via INTRAVENOUS
  Filled 2023-05-14 (×9): qty 2

## 2023-05-14 NOTE — Progress Notes (Addendum)
 Inpatient Rehabilitation Admissions Coordinator   Patient not at a level to tolerate the intensity required of an Inpt rehab admit at this time. Dr Natale Lay consulted on 3/4. Other rehab venues should be pursued at this time. TOC made aware. We will sign off.   Ottie Glazier, RN, MSN Rehab Admissions Coordinator [phone] 05/14/2023 1:07 PM

## 2023-05-14 NOTE — Consult Note (Signed)
 Palliative Medicine Inpatient Consult Note  Consulting Provider:  Lynnell Catalan, MD    Reason for consult:  Developed difficult to control seizures in the context of primary CNS lymphoma.  Now has difficulty speaking and swallowing.  05/14/2023  HPI:  Per intake H&P --> 73 year old male Prostate cancer with prostatectomy 2021, bilateral PE after surgery at that time. Lumbosacral stenosis and lower back pain manage conservatively. Atrial fibrillation not on anticoagulation secondary to high bleeding risk. Chronic hepatitis B tenofovir. 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 Colonel Bald completed 6 cycles of R-CHOP with remission of the parenchymal lesion receiving high-dose methotrexate cycle 2 to start. Patient was readmitted on 2/25 for second cycle of methotrexate/leucovorin and was noted to have some left-sided weakness as well as partial sensory seizures and was on Keppra from the prior admission.  Palliative care has been asked to get involved to support additional goals of care conversations.   Clinical Assessment/Goals of Care:  *Please note that this is a verbal dictation therefore any spelling or grammatical errors are due to the "Dragon Medical One" system interpretation.  I have reviewed medical records including EPIC notes, labs and imaging, received report from bedside RN, assessed the patient who is lying in bed with notable secretion burden.   I met with patients daughter, Karena Addison to further discuss diagnosis prognosis, GOC, EOL wishes, disposition and options.   I introduced Palliative Medicine as specialized medical care for people living with serious illness. It focuses on providing relief from the symptoms and stress of a serious illness. The goal is to improve quality of life for both the patient and the  family.  Medical History Review and Understanding:  A review of [place] past medical history inclusive of prostate cancer, pulmonary embolism, lower back pain, hepatitis B, B-cell lymphoma, CVA, and seizures was completed.  Social History:  Viaan is from [place] originally.  He is married.  He has 8 children and 18 grandchildren.  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  He is a man of faith practicing within Catholicism.  Functional and Nutritional State:  Preceding hospitalization Malekai was able to function.   Advance Directives:  A detailed discussion was had today regarding advanced directives.  Patient's wife is a Runner, broadcasting/film/video he does have 8 children daughter Karena Addison is  Code Status:  Concepts specific to code status, artifical feeding and hydration, continued IV antibiotics and rehospitalization was had.  The difference between a aggressive medical intervention path  and a palliative comfort care path for this patient at this time was had.   We discussed that as of presently [place] is a full code full scope of care.  I expressed my concerns in putting earnest through cardiopulmonary resuscitation.  His daughter, Karena Addison agrees that it would likely not be within his best interest.  She would however like to speak to the rest of her family before making this decision.  Discussion:  A review of Daimian's poor clinical condition was completed.  We discussed the short-term and long-term concerns.  I shared immediate concerns include patient's recent stroke and dysphagia.  Patient is not able to support himself nutritionally.  Freedom also has a large degree of secretion burden which at any time he could aspirate on.  We discussed the conversation that Karena Addison had with Dr. Candise Che.  She shares understanding that he  is not of candidate for chemotherapy.  She is interested to see if the radiation oncology team could offer anything though per their  discussion he would take 4 to 8 weeks from now to determine if they would offer additional therapies given patient's recent stroke.  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  She shares she and her family are struggling a bit and waiting to see if he will improve or continue to decline.  Patient's daughter shares the patient's greatest which has been to go back to [place].  I was honest in stating that I have not had a patient to go back to [place] living.  We discussed that I could reach out to the transitions of care team to see if there are any thoughts that they would have to offer.  We did discuss options such as comfort in the hospital as well as transition to inpatient hospice.  Patient's daughter shares that [place] is Catholic and does request anointing of the sick.  I shared that I would request our chaplain help with this further.  Discussed the importance of continued conversation with family and their  medical providers regarding overall plan of care and treatment options, ensuring decisions are within the context of the patients values and GOCs.  Decision Maker: Sherron Flemings (Daughter): [phone] (Mobile)   SUMMARY OF RECOMMENDATIONS   Full Code --> Patient's daughters discussing this further with family  Open and honest conversations held in the setting of patient's progressive B-cell lymphoma and poor long-term prognosis complicated by recent stroke  Appreciate the transitions of care team identifying if they have supported patient's going to [place] in the past  Appreciate chaplain supporting patient and anointing of the sick  Ongoing palliative care support  Code Status/Advance Care Planning: FULL CODE   Palliative Prophylaxis:  Aspiration, Bowel Regimen, Delirium Protocol, Frequent Pain Assessment, Oral Care, Palliative Wound Care, and Turn  Reposition  Additional Recommendations (Limitations, Scope, Preferences): Continue current care  Psycho-social/Spiritual:  Desire for further Chaplaincy support: Yes Additional Recommendations: Education on declining clinical state and goals of care decisions   Prognosis: Very poor overall  Discharge Planning: To be determined Vitals:   05/14/23 0351 05/14/23 0804  BP: 117/80 (!) 118/93  Pulse: (!) 107 (!) 106  Resp: 17 18  Temp: 98 F (36.7 C) 100.3 F (37.9 C)  SpO2: 98% 98%    Intake/Output Summary (Last 24 hours) at 05/14/2023 0915 Last data filed at 05/14/2023 0830 Gross per 24 hour  Intake 2217.67 ml  Output 1025 ml  Net 1192.67 ml   Last Weight  Most recent update: 05/14/2023  4:58 AM    Weight  88.2 kg (194 lb 7.1 oz)            Gen: Elderly African-American male chronically ill-appearing HEENT: Core track, moist mucous membranes CV: Regular rate and rhythm  PULM: On 2 L nasal cannula, notable secretions, breathing is unlabored ABD: soft/nontender  EXT: Generalized edema  Neuro: Somnolent  PPS: 10%   This conversation/these recommendations were discussed with patient primary care team, Dr. Alanda Slim  Billing based on MDM: High ______________________________________________________ Lamarr Lulas [facility] Palliative Medicine Team Team Cell Phone: [phone] Please utilize secure chat with additional questions, if there is no response within 30 minutes please call the above phone number  Palliative Medicine Team providers are available by phone from 7am to  7pm daily and can be reached through the team cell phone.  Should this patient require assistance outside of these hours, please call the patient's attending physician.

## 2023-05-14 NOTE — Progress Notes (Signed)
 PROGRESS NOTE  Tyler [name] ZOX:[id number] DOB: March 26, 1950   PCP: Nechama Guard, FNP  Patient is from: Home.  DOA: 04/22/2023 LOS: 22  Chief complaints No chief complaint on file.    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  Patient developed neck swelling, drooling, difficulty swallowing and altered mental status with concern for partial seizure.  Pulmonology consulted on 2/28.  He was intubated and admitted to ICU.  Core track placed on 3/1.  MRI brain without contrast on 3/11 showed resolving diffusion abnormality within the posterior right MCA territory and increased extent of hyperintense T2 weighted signal in right hemisphere concerning for progression of lymphoma or confluence of postischemic/postictal edema and lymphoma related vasogenic edema.  Eventually extubated on 3/2 but continued to have focal seizure.  He was reintubated and finally extubated on 3/14 again.  He was transferred to hospitalist service on 3/17.  Oncology, neurology and palliative medicine following.  Subjective: Seen and examined earlier this morning.  No major events overnight of this morning.  Patient is sleepy but wakes to voice.  Minimally interactive.  He moves right upper extremity but does not move other extremities.  No family member at bedside.  Objective: Vitals:   05/14/23 0458 05/14/23 0804 05/14/23 1136 05/14/23 1641  BP:  (!) 118/93 136/86 119/88  Pulse:  (!) 106 86 98  Resp:  18 18 15   Temp:  100.3 F (37.9 C) 99.1 F (37.3 C) 98.9 F (37.2 C)   TempSrc:  Oral Oral Oral  SpO2:  98% 99% 99%  Weight: 88.2 kg     Height:        Examination:  GENERAL: No apparent distress.  Nontoxic. HEENT: MMM.  Vision and hearing grossly intact.  NECK: Supple.  No apparent JVD.  RESP:  No IWOB.  Fair aeration bilaterally. CVS:  RRR. Heart sounds normal.  ABD/GI/GU: BS+. Abd soft, NTND.  MSK/EXT:  Moves extremities. No apparent deformity. No edema.  SKIN: no apparent skin lesion or wound NEURO: Sleepy but wakes to voice.  Does not interact.  Moves RUE.  Does not move other extremities even to noxious stimuli.  Limited neuroexam due to mental status. PSYCH: Calm. Normal affect.   Consultants:  Hematology/oncology admitted patient Critical care Neurology Radiation oncology Palliative medicine  Procedures: Intubation and mechanical ventilation.  Microbiology summarized: 3/1-MRSA PCR screen nonreactive. 3/1-respiratory culture negative 3/1-a 20 pathogen RVP nonreactive 3/1-blood cultures NGTD 3/11-respiratory culture with abundant Pseudomonas aeruginosa 3/13-blood cultures NGTD   Assessment and plan: Acute ischemic stroke?  MRI on 3/11 showed resolving diffusion abnormality within the posterior right MCA territory, and increased extent of hyperintense T2 weighted signal in the right hemisphere concerning for progression of lymphoma or postischemic edema.  CT angio head and neck without LVO or high-grade stenosis.  TTE on 3/1 without significant finding. -Appreciated by neurology-continue aspirin 81 mg daily but getting rectal aspirin 300 mg daily now -PT, OT, SLP and telemetry -Follow lipid panel  Focal seizure secondary to cerebral edema: LTM EEG on 3/3 with clinical signs of seizures arising from right parietal region and evidence of epileptogenicity and  cortical dysfunction arising from the right hemisphere. -Appreciate help by neurology-LTM EEG, Vimpat, Keppra and gabapentin -Continue Decadron -Seizure precaution  High-grade  B-cell lymphoma with resection and recent chemo-primarily admitted second cycle of methotrexate and leucovorin -Oncology recommending goal of care discussion. -No clear role of radiation per radiation oncology. -Continue Decadron and Keppra. -Palliative medicine on board.  Dysphagia: Due to the above. -Continue tube feed  Possible aspiration pneumonia: Respiratory culture with Pseudomonas. -Completed IV Zosyn from 3/13-3/18.  Paroxysmal atrial fibrillation: Rate controlled without meds.  Not on AC due to bleeding risk. -Optimize electrolytes   Previous PEs LLE DVT: No longer on AC.   Prostate cancer s/p prostatectomy   Hepatitis B -Continue tenofovir 25 daily   Class I obesity Body mass index is 30.45 kg/m. Nutrition Problem: Inadequate oral intake Etiology:  (seizures) Signs/Symptoms: NPO status Interventions: Tube feeding, Prostat   DVT prophylaxis:  SCDs Start: 04/25/23 2209 enoxaparin (LOVENOX) injection 40 mg Start: 04/22/23 2200  Code Status: Full code Family Communication: Updated patient's daughter over the phone. Level of care: Progressive Status is: Inpatient Remains inpatient appropriate because: Seizure, diffuse B-cell lymphoma, acute CVA and dysphagia   Final disposition: To be determined   55 minutes with more than 50% spent in reviewing records, counseling patient/family and coordinating care.   Sch Meds:  Scheduled Meds:  aspirin  300 mg Rectal Daily   Chlorhexidine Gluconate Cloth  6 each Topical Nightly   cholecalciferol  2,000 Units Per Tube Daily   vitamin B-12  1,000 mcg Per Tube Daily   dexamethasone  4 mg Per Tube Q12H   enoxaparin (LOVENOX) injection  40 mg Subcutaneous Q24H   feeding supplement (PROSource TF20)  60 mL Per Tube BID   fiber supplement (BANATROL TF)  60 mL Per Tube TID   folic acid  1 mg Per Tube Daily   gabapentin  100 mg Per Tube Q8H   insulin aspart  0-15 Units Subcutaneous Q4H   lacosamide  200 mg Per Tube BID    levETIRAcetam  2,000 mg Per Tube BID   mouth rinse  15 mL Mouth Rinse 4 times per day   scopolamine  1 patch Transdermal Q72H   sodium chloride flush  10-40 mL Intracatheter Q12H   sodium chloride flush  3-10 mL Intravenous Q12H   tenofovir alafenamide  25 mg Per Tube Daily   thiamine (VITAMIN B1) injection  100 mg Intravenous Daily   Continuous Infusions:  feeding supplement (OSMOLITE 1.5 CAL) 1,000 mL (05/14/23 1150)   ondansetron (ZOFRAN) IV     PRN Meds:.acetaminophen, alum & mag hydroxide-simeth, hydrALAZINE, hydrocortisone, ibuprofen, lidocaine, LORazepam, magic mouthwash w/lidocaine, ondansetron **OR** ondansetron **OR** ondansetron (ZOFRAN) IV **OR** ondansetron (ZOFRAN) IV, mouth rinse, polyethylene glycol, senna-docusate, sodium chloride flush, sodium chloride flush  Antimicrobials: Anti-infectives (From admission, onward)    Start     Dose/Rate Route Frequency Ordered Stop   05/09/23 1000  tenofovir alafenamide (VEMLIDY) tablet 25 mg        25 mg Per Tube Daily 05/08/23 1135     05/08/23 1245  piperacillin-tazobactam (ZOSYN) IVPB 3.375 g        3.375 g 12.5 mL/hr over 240 Minutes Intravenous Every 8 hours 05/08/23 1153 05/13/23 0955   05/08/23 1000  tenofovir (VIREAD) tablet 300 mg  Status:  Discontinued        300 mg Per Tube Daily 05/07/23 1143 05/08/23 1135   05/07/23 2300  vancomycin (VANCOCIN) IVPB 1000 mg/200 mL premix  Status:  Discontinued  1,000 mg 200 mL/hr over 60 Minutes Intravenous Every 12 hours 05/07/23 1218 05/08/23 1401   05/07/23 1015  vancomycin (VANCOREADY) IVPB 1750 mg/350 mL        1,750 mg 175 mL/hr over 120 Minutes Intravenous  Once 05/07/23 0921 05/07/23 1236   05/07/23 1015  ceFEPIme (MAXIPIME) 2 g in sodium chloride 0.9 % 100 mL IVPB  Status:  Discontinued        2 g 200 mL/hr over 30 Minutes Intravenous Every 8 hours 05/07/23 0921 05/08/23 1153   04/28/23 1000  tenofovir alafenamide (VEMLIDY) tablet 25 mg  Status:  Discontinued        25  mg Per Tube Daily 04/27/23 2309 05/07/23 1143   04/23/23 1000  tenofovir alafenamide (VEMLIDY) tablet 25 mg  Status:  Discontinued        25 mg Oral Daily 04/22/23 1520 04/27/23 2309        I have personally reviewed the following labs and images: CBC: Recent Labs  Lab 05/08/23 0440 05/09/23 0407 05/11/23 0500 05/12/23 1017  WBC 3.9* 4.8 7.4 7.7  NEUTROABS  --   --   --  5.1  HGB 11.4* 10.9* 11.8* 12.0*  HCT 34.0* 32.7* 35.3* 36.5*  MCV 88.8 89.8 90.5 90.3  PLT 214 227 267 284   BMP &GFR Recent Labs  Lab 05/08/23 0440 05/09/23 0407 05/11/23 0500 05/12/23 1017  NA 130* 130* 134* 136  K 4.4 4.2 4.2 4.3  CL 99 98 98 98  CO2 25 24 29 27   GLUCOSE 185* 172* 129* 105*  BUN 25* 28* 22 21  CREATININE 0.93 0.87 0.73 0.71  CALCIUM 8.1* 8.0* 8.4* 8.6*  MG  --   --  2.1  --   PHOS  --   --  3.4  --    Estimated Creatinine Clearance: 91 mL/min (by C-G formula based on SCr of 0.71 mg/dL). Liver & Pancreas: Recent Labs  Lab 05/08/23 0440 05/09/23 0407 05/12/23 1017  AST 23 29 34  ALT 38 43 77*  ALKPHOS 52 56 66  BILITOT 0.4 0.4 0.6  PROT 5.4* 5.2* 5.6*  ALBUMIN 2.2* 2.1* 2.7*   No results for input(s): "LIPASE", "AMYLASE" in the last 168 hours. No results for input(s): "AMMONIA" in the last 168 hours. Diabetic: No results for input(s): "HGBA1C" in the last 72 hours. Recent Labs  Lab 05/13/23 2350 05/14/23 0352 05/14/23 0805 05/14/23 1133 05/14/23 1600  GLUCAP 107* 130* 109* 94 132*   Cardiac Enzymes: No results for input(s): "CKTOTAL", "CKMB", "CKMBINDEX", "TROPONINI" in the last 168 hours. No results for input(s): "PROBNP" in the last 8760 hours. Coagulation Profile: No results for input(s): "INR", "PROTIME" in the last 168 hours. Thyroid Function Tests: No results for input(s): "TSH", "T4TOTAL", "FREET4", "T3FREE", "THYROIDAB" in the last 72 hours. Lipid Profile: No results for input(s): "CHOL", "HDL", "LDLCALC", "TRIG", "CHOLHDL", "LDLDIRECT" in the last  72 hours. Anemia Panel: No results for input(s): "VITAMINB12", "FOLATE", "FERRITIN", "TIBC", "IRON", "RETICCTPCT" in the last 72 hours. Urine analysis:    Component Value Date/Time   COLORURINE YELLOW 05/08/2023 1632   APPEARANCEUR HAZY (A) 05/08/2023 1632   LABSPEC >1.046 (H) 05/08/2023 1632   PHURINE 6.0 05/08/2023 1632   GLUCOSEU NEGATIVE 05/08/2023 1632   HGBUR NEGATIVE 05/08/2023 1632   BILIRUBINUR NEGATIVE 05/08/2023 1632   KETONESUR NEGATIVE 05/08/2023 1632   PROTEINUR NEGATIVE 05/08/2023 1632   NITRITE NEGATIVE 05/08/2023 1632   LEUKOCYTESUR NEGATIVE 05/08/2023 1632   Sepsis Labs: Invalid input(s): "PROCALCITONIN", "  LACTICIDVEN"  Microbiology: Recent Results (from the past 240 hours)  Culture, Respiratory w Gram Stain     Status: None   Collection Time: 05/06/23 11:18 AM   Specimen: Tracheal Aspirate; Respiratory  Result Value Ref Range Status   Specimen Description TRACHEAL ASPIRATE  Final   Special Requests NONE  Final   Gram Stain   Final    NO WBC SEEN MODERATE GRAM POSITIVE COCCI MODERATE GRAM NEGATIVE RODS Performed at [facility], [address], [place], [place] [address]    Culture ABUNDANT PSEUDOMONAS AERUGINOSA  Final   Report Status 05/08/2023 FINAL  Final   Organism ID, Bacteria PSEUDOMONAS AERUGINOSA  Final      Susceptibility   Pseudomonas aeruginosa - MIC*    CEFTAZIDIME 2 SENSITIVE Sensitive     CIPROFLOXACIN <=0.25 SENSITIVE Sensitive     GENTAMICIN 2 SENSITIVE Sensitive     IMIPENEM 1 SENSITIVE Sensitive     PIP/TAZO <=4 SENSITIVE Sensitive ug/mL    CEFEPIME 2 SENSITIVE Sensitive     * ABUNDANT PSEUDOMONAS AERUGINOSA  MRSA Next Gen by PCR, Nasal     Status: None   Collection Time: 05/08/23 11:20 AM   Specimen: Nasal Mucosa; Nasal Swab  Result Value Ref Range Status   MRSA by PCR Next Gen NOT DETECTED NOT DETECTED Final    Comment: (NOTE) The GeneXpert MRSA Assay (FDA approved for NASAL specimens only), is one component of a  comprehensive MRSA colonization surveillance program. It is not intended to diagnose MRSA infection nor to guide or monitor treatment for MRSA infections. Test performance is not FDA approved in patients less than 21 years old. Performed at [facility], [address], [place], [place] [address]   Culture, blood (Routine X 2) w Reflex to ID Panel     Status: None   Collection Time: 05/08/23  7:40 PM   Specimen: BLOOD RIGHT ARM  Result Value Ref Range Status   Specimen Description BLOOD RIGHT ARM  Final   Special Requests   Final    BOTTLES DRAWN AEROBIC ONLY Blood Culture results may not be optimal due to an inadequate volume of blood received in culture bottles   Culture   Final    NO GROWTH 5 DAYS Performed at [facility], [address], [place], [place] [address]    Report Status 05/13/2023 FINAL  Final  Culture, blood (Routine X 2) w Reflex to ID Panel     Status: None   Collection Time: 05/08/23  7:47 PM   Specimen: BLOOD RIGHT HAND  Result Value Ref Range Status   Specimen Description BLOOD RIGHT HAND  Final   Special Requests   Final    BOTTLES DRAWN AEROBIC ONLY Blood Culture results may not be optimal due to an inadequate volume of blood received in culture bottles   Culture   Final    NO GROWTH 5 DAYS Performed at [facility], [address] [place], [place] [address]    Report Status 05/13/2023 FINAL  Final    Radiology Studies: No results found.    Loyd Marhefka T. Kaneesha Constantino [facility]  If 7PM-7AM, please contact night-coverage [email or web address] 05/14/2023, 5:03 PM

## 2023-05-14 NOTE — Progress Notes (Addendum)
 This chaplain responded to PMT NP-Michelle referral for spiritual care. The chaplain understands the Pt. daughter-Irene is requesting "Anointing of the Sick" for the Pt. The chaplain attempted to phone Karena Addison to inquire about Pt. connection to [employer] clergy in community. The chaplain was unable to complete the phone call with Karena Addison.  **[phone] This chaplain learned the Pt. is connected to [facility] [employer]. The chaplain understands Father at [facility] will contact Karena Addison to arrange the best time for "Anointing of the Sick." Irene's phone number was shared with Father, with Irene's permission.  This chaplain is available for F/U spiritual care as needed.  Chaplain Stephanie Acre [phone]

## 2023-05-14 NOTE — Progress Notes (Signed)
 Occupational Therapy Treatment Patient Details Name: Tyler [name] MRN: [id number] DOB: 28-Oct-1950 Today's Date: 05/14/2023   History of present illness 73 yo male presents on 2/25 for second cycle of high dose methotrexate with leucovorin rescue. 2/27 MRI showed marked interval decrease in size of right parietal mass/lymphoma and new diffusion signal abnormality involving the right insular cortex and overlying right frontal lobe, favored to reflect sequelae of seizure. 2/28 transferred to ICU and intubated. 3/2 extubated. 3/8 with seizures and need for airway protection so re-intubated and continued to have breakthrough seizures; extubated 3/14. EEG with evidence of epileptogenicity arising from R hemisphere, maximal R parietal region and cortical dysfunction in R hemisphere. PMH: CNS relapse of large B-cell lymphoma, partial seizure, paroxysmal atrial fib not on anticoagulation due to bleeding risk, prostate cancer status post prostatectomy 2021, pulmonary embolism.   OT comments  Patient seen with PT to address bed mobility and sitting balance/tolerance. Patient asleep upon entry but able to awaken and video interpreter utilized. Patient found to have soiled self and performed rolling in bed to provide cleaning and pad change with total assist. Patient was total assist +2 to get to EOB and total to max assist for sitting balance due to posterior and left lateral leaning. Patient performed face washing with HOH to initiate. Patient stood from EOB with total assist +2 and use of bed pads. Once back on EOB patient demonstrated improved sitting balance with bouts of min to mod assist for balance. Patient performed reaching tasks with increased time to follow command and another stand before returning to supine with total assist +2. Patient will benefit from intensive inpatient follow-up therapy, >3 hours/day. Acute OT to continue to follow to address established goals to facilitate DC to next venue of care.         If plan is discharge home, recommend the following:  Assistance with cooking/housework;Assistance with feeding;Assist for transportation;Help with stairs or ramp for entrance;Direct supervision/assist for medications management;Direct supervision/assist for financial management;Two people to help with walking and/or transfers;Two people to help with bathing/dressing/bathroom   Equipment Recommendations  Other (comment) (defer)    Recommendations for Other Services      Precautions / Restrictions Precautions Precautions: Fall;Other (comment) Recall of Precautions/Restrictions: Impaired Precaution/Restrictions Comments: cortrak Restrictions Weight Bearing Restrictions Per Provider Order: No       Mobility Bed Mobility Overal bed mobility: Needs Assistance Bed Mobility: Supine to Sit, Sit to Supine     Supine to sit: Total assist, +2 for physical assistance, +2 for safety/equipment Sit to supine: Total assist, +2 for physical assistance, +2 for safety/equipment   General bed mobility comments: helicopter technique utilized to get to EOB with total assist +2. Assistance with trunk and BLEs to return to supine    Transfers Overall transfer level: Needs assistance Equipment used: 2 person hand held assist Transfers: Sit to/from Stand Sit to Stand: Total assist, +2 physical assistance, +2 safety/equipment           General transfer comment: Stood from EOB with total assist +2-3 woth use of bed pads     Balance Overall balance assessment: Needs assistance Sitting-balance support: Bilateral upper extremity supported, Feet supported Sitting balance-Leahy Scale: Poor Sitting balance - Comments: bouts of min to mod assist for sitting on EOB with max to total assit at beginning of session. Postural control: Posterior lean, Left lateral lean Standing balance support: Bilateral upper extremity supported Standing balance-Leahy Scale: Zero Standing balance comment: dependent  on therapists for support  ADL either performed or assessed with clinical judgement   ADL Overall ADL's : Needs assistance/impaired     Grooming: Wash/dry face;Minimal assistance;Sitting Grooming Details (indicate cue type and reason): HOH to initiate and patient later initiating on his own     Lower Body Bathing: Total assistance;+2 for physical assistance;Bed level Lower Body Bathing Details (indicate cue type and reason): peri area bathing following having loose stool in bed with total assist +2 Upper Body Dressing : Maximal assistance;Bed level Upper Body Dressing Details (indicate cue type and reason): to change gown Lower Body Dressing: Total assistance;Bed level Lower Body Dressing Details (indicate cue type and reason): to donn socks               General ADL Comments: increased time to follow commands    Extremity/Trunk Assessment              Vision       Perception     Praxis     Communication Communication Communication: Impaired Factors Affecting Communication: Difficulty expressing self;Reduced clarity of speech;Other (comment) (video intrepreter utilized)   Cognition Arousal: Alert Behavior During Therapy: Flat affect Cognition: No family/caregiver present to determine baseline                               Following commands: Impaired Following commands impaired: Follows one step commands inconsistently, Follows one step commands with increased time      Cueing   Cueing Techniques: Verbal cues, Gestural cues, Tactile cues, Visual cues  Exercises      Shoulder Instructions       General Comments      Pertinent Vitals/ Pain       Pain Assessment Pain Assessment: Faces Faces Pain Scale: No hurt Pain Intervention(s): Monitored during session  Home Living                                          Prior Functioning/Environment              Frequency  Min  1X/week        Progress Toward Goals  OT Goals(current goals can now be found in the care plan section)  Progress towards OT goals: Progressing toward goals  Acute Rehab OT Goals Patient Stated Goal: none stated OT Goal Formulation: Patient unable to participate in goal setting Time For Goal Achievement: 05/24/23 Potential to Achieve Goals: Fair ADL Goals Pt Will Perform Eating: sitting;with supervision Pt Will Perform Grooming: with min assist;sitting Pt Will Perform Lower Body Bathing: sitting/lateral leans;with min assist;sit to/from stand Pt Will Perform Upper Body Dressing: sitting;with mod assist Pt Will Perform Lower Body Dressing: sit to/from stand;with max assist Pt Will Transfer to Toilet: with mod assist;with +2 assist;stand pivot transfer Pt Will Perform Toileting - Clothing Manipulation and hygiene: with supervision;sit to/from stand Pt/caregiver will Perform Home Exercise Program: Increased strength;Both right and left upper extremity;With theraband;With theraputty;With Supervision;With written HEP provided Additional ADL Goal #1: Pt will maintain midline postural control EOB wtih S in preparation for ADL tasks  Plan Discharge plan remains appropriate    Co-evaluation    PT/OT/SLP Co-Evaluation/Treatment: Yes Reason for Co-Treatment: For patient/therapist safety;To address functional/ADL transfers   OT goals addressed during session: ADL's and self-care      AM-PAC OT "6 Clicks" Daily Activity  Outcome Measure   Help from another person eating meals?: Total Help from another person taking care of personal grooming?: A Lot Help from another person toileting, which includes using toliet, bedpan, or urinal?: Total Help from another person bathing (including washing, rinsing, drying)?: Total Help from another person to put on and taking off regular upper body clothing?: Total Help from another person to put on and taking off regular lower body clothing?:  Total 6 Click Score: 7    End of Session Equipment Utilized During Treatment: Gait belt  OT Visit Diagnosis: Unsteadiness on feet (R26.81);Other abnormalities of gait and mobility (R26.89);Muscle weakness (generalized) (M62.81);Other symptoms and signs involving cognitive function;Low vision, both eyes (H54.2)   Activity Tolerance Patient tolerated treatment well   Patient Left in bed;with call bell/phone within reach;with bed alarm set   Nurse Communication Mobility status        Time: 7829-5621 OT Time Calculation (min): 35 min  Charges: OT General Charges $OT Visit: 1 Visit OT Treatments $Self Care/Home Management : 8-22 mins  Alfonse Flavors, OTA Acute Rehabilitation Services  Office [phone]   Dewain Penning 05/14/2023, 12:32 PM

## 2023-05-14 NOTE — Progress Notes (Signed)
 Physical Therapy Treatment Patient Details Name: Tyler [name] MRN: [id number] DOB: Feb 03, 1951 Today's Date: 05/14/2023   History of Present Illness 73 yo male presents on 2/25 for second cycle of high dose methotrexate with leucovorin rescue. 2/27 MRI showed marked interval decrease in size of right parietal mass/lymphoma and new diffusion signal abnormality involving the right insular cortex and overlying right frontal lobe, favored to reflect sequelae of seizure. 2/28 transferred to ICU and intubated. 3/2 extubated. 3/8 with seizures and need for airway protection so re-intubated and continued to have breakthrough seizures; extubated 3/14. EEG with evidence of epileptogenicity arising from R hemisphere, maximal R parietal region and cortical dysfunction in R hemisphere. PMH: CNS relapse of large B-cell lymphoma, partial seizure, paroxysmal atrial fib not on anticoagulation due to bleeding risk, prostate cancer status post prostatectomy 2021, pulmonary embolism.    PT Comments  Session focused on theract to promote functional mobility and transfers. After sitting following a sit to stand pt displayed improvements in his sitting balance ability by reducing the amount of assistance required to maintain truncal stability and sat EOB for around 16 minutes with min-mod A support. Pt continues to display deficits in global UE and LE control but was able to voluntary flex his L knee with sitting EOB. Pt appeared to benefit from assisted visual attention to the extremity being asked to move. Pt continues to display deficits in activity tolerance, static/dynamic balance, functional mobility, transfers, and voluntary movements and would benefit from continued acute PT. Will continue to follow acutely.     If plan is discharge home, recommend the following: A lot of help with walking and/or transfers;A lot of help with bathing/dressing/bathroom;Assistance with cooking/housework;Assistance with feeding;Direct  supervision/assist for medications management;Direct supervision/assist for financial management;Assist for transportation;Help with stairs or ramp for entrance   Can travel by private vehicle     No  Equipment Recommendations  Other (comment) (defere to future visits as pt's cognition improves)    Recommendations for Other Services Rehab consult     Precautions / Restrictions Precautions Precautions: Fall;Other (comment) Recall of Precautions/Restrictions: Impaired Precaution/Restrictions Comments: cortrak Restrictions Weight Bearing Restrictions Per Provider Order: No     Mobility  Bed Mobility Overal bed mobility: Needs Assistance Bed Mobility: Supine to Sit, Sit to Supine     Supine to sit: Total assist, +2 for physical assistance, +2 for safety/equipment Sit to supine: Total assist, +2 for physical assistance, +2 for safety/equipment   General bed mobility comments: helicopter technique utilized to get to EOB with total assist +2. Assistance with trunk and BLEs to return to supine    Transfers Overall transfer level: Needs assistance Equipment used: 2 person hand held assist Transfers: Sit to/from Stand Sit to Stand: Total assist, +2 physical assistance, +2 safety/equipment           General transfer comment: Stood from EOB with total assist +2-3 with use of bed pads. Pt had difficulty completing extending his hips and required assistance to place his hands in correct location. 2 sit to stands performed    Ambulation/Gait               General Gait Details: deferred s/t pt status at this time   Stairs             Wheelchair Mobility     Tilt Bed    Modified Rankin (Stroke Patients Only)       Balance Overall balance assessment: Needs assistance Sitting-balance support: Bilateral upper extremity supported, Feet supported Sitting  balance-Leahy Scale: Poor Sitting balance - Comments: bouts of min to mod assist for sitting on EOB with max  to total assit at beginning of session. Posterior and L lean throughout sitting EOB Postural control: Posterior lean, Left lateral lean Standing balance support: Bilateral upper extremity supported Standing balance-Leahy Scale: Zero Standing balance comment: pt is dependent on therapists to maintain standing balance and has difficulty achieving full upright posture                            Communication Communication Communication: Impaired Factors Affecting Communication: Difficulty expressing self;Reduced clarity of speech;Other (comment) (video intrepreter utilized)  Cognition Arousal: Alert Behavior During Therapy: Flat affect   PT - Cognitive impairments: Difficult to assess Difficult to assess due to: Impaired communication, Non-English speaking                     PT - Cognition Comments: pt only spoke to interpreter once or twice throughout session. Cognition appears decreased s/t medical condition Following commands: Impaired Following commands impaired: Follows one step commands inconsistently, Follows one step commands with increased time    Cueing Cueing Techniques: Verbal cues, Gestural cues, Tactile cues, Visual cues  Exercises      General Comments        Pertinent Vitals/Pain Pain Assessment Pain Assessment: Faces Faces Pain Scale: No hurt Pain Intervention(s): Monitored during session    Home Living                          Prior Function            PT Goals (current goals can now be found in the care plan section) Acute Rehab PT Goals Patient Stated Goal: none stated PT Goal Formulation: Patient unable to participate in goal setting Time For Goal Achievement: 05/24/23 Potential to Achieve Goals: Fair Progress towards PT goals: Progressing toward goals    Frequency    Min 3X/week      PT Plan Current plan remains appropriate    Co-evaluation PT/OT/SLP Co-Evaluation/Treatment: Yes Reason for Co-Treatment:  For patient/therapist safety;To address functional/ADL transfers PT goals addressed during session: Mobility/safety with mobility;Balance;Strengthening/ROM        AM-PAC PT "6 Clicks" Mobility   Outcome Measure  Help needed turning from your back to your side while in a flat bed without using bedrails?: Total Help needed moving from lying on your back to sitting on the side of a flat bed without using bedrails?: Total Help needed moving to and from a bed to a chair (including a wheelchair)?: Total Help needed standing up from a chair using your arms (e.g., wheelchair or bedside chair)?: Total Help needed to walk in hospital room?: Total Help needed climbing 3-5 steps with a railing? : Total 6 Click Score: 6    End of Session Equipment Utilized During Treatment: Gait belt;Oxygen Activity Tolerance: Patient limited by fatigue Patient left: in bed;with call bell/phone within reach;with bed alarm set Nurse Communication: Other (comment) (requested prevalon boots and rolling schedule) PT Visit Diagnosis: Unsteadiness on feet (R26.81);Other abnormalities of gait and mobility (R26.89);Other symptoms and signs involving the nervous system (R29.898);Muscle weakness (generalized) (M62.81)     Time: 1020-1055 PT Time Calculation (min) (ACUTE ONLY): 35 min  Charges:    $Therapeutic Activity: 8-22 mins PT General Charges $$ ACUTE PT VISIT: 1 Visit  [address] [place], SPT    [place] Cherree Conerly 05/14/2023, 4:14 PM

## 2023-05-14 NOTE — Progress Notes (Signed)
 Speech Language Pathology Treatment: Dysphagia  Patient Details Name: Tyler [name] MRN: [id number] DOB: 1950/12/04 Today's Date: 05/14/2023 Time: 0910-0930 SLP Time Calculation (min) (ACUTE ONLY): 20 min  Assessment / Plan / Recommendation Clinical Impression  Pt seen at bedside for skilled ST intervention targeting goal for PO readiness. Pt was awake and alert. No family present at this time. Prior to oral care, pt was noted to exhibit wet breath/voice sounds, raising concern for poor secretion management. Pt was more accepting of oral care today, with successful removal of secretions held orally. Pt allowed SLP to clean his teeth and interior oral cavity thoroughly. Pt would not allow SLP to clean his face with a warm cloth, however, pt was able to use right hand to wipe his nose and eyes.   Following oral care, pt accepted individual ice chips and small boluses of applesauce. No oral manipulation was noted on either texture. Right anterior leakage of water noted. Reflexive swallow was appreciated to palpation, however, the majority of the boluses was suctioned from oral cavity.   Pt continues to be unsafe for PO intake given presentation at bedside. Recommend continued thorough oral care 3-4x/day given pt tendency to hold secretions orally. Pt currently has Cortrak in place. SLP will continue to follow to assess readiness for PO intake and/or instrumental study.   Per documentation, SLE  was initially completed 04/28/23, which indicated "nonfluent, mixed expressive and receptive language deficits c/w aphasia in the setting of frequent and ongoing seizures vs brain tumor." Speech difficult to understand in native language as well as delayed/no response at baseline per pt/s daughter.    Given that SLP signed off 05/06/23 when pt was reintubated, request new orders to repeat SLE.    HPI HPI: 73 yo male presents to [facility] on 2/10 with LUE and LE weakness, and change in mentation. Head CT of brain shows a  recurrent tumor at the craniotomy site and increased size of recurrent tumor along with a left midline shift up to 6 mm.  Hospital course complicated by concern for neck swelling, acute dysphagia, and concern for airway protection prompting intubation from 02/28-03/02. Neck CT on 04/26/23: demonstrated "No acute finding. Assessment of mucosal surfaces limited by airway collapse in the setting of intubation." MBS 3/3 showed an oral more than pharyngeal dysphagia although with silent aspiration x1 with thin liquids. Aspiration risk was felt to be mostly related to impulsivity/mentation, and he did progress from full liquid to pureed diet. Seizure activity increased though resulting in reintubation 3/8-3/14. Pt's swallowing was previously evaluated when he was admitted in June of 2024 - he was D/Cd on a dysphagia 3 diet with thin liquids with no f/u recommended. Pt's swallowing assessed again on 04/08/23 this admission with recommendation of a regular diet and thin liquids.  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      SLP Plan  Continue with current plan of care Request new orders for SLE      Recommendations for follow up therapy are one component of a multi-disciplinary discharge planning process, led by the attending physician.  Recommendations may be updated based on patient status, additional functional criteria and insurance authorization.    Recommendations  Diet recommendations: NPO Medication Administration: Via alternative means        Oral care QID;Staff/trained caregiver to provide  oral care   Frequent or constant Supervision/Assistance Dysphagia, oropharyngeal phase (R13.12)     Continue with current plan of care    Cleola Perryman B. Murvin Natal, [facility], CCC-SLP Speech  Language Pathologist Office: [phone]  Leigh Aurora 05/14/2023, 9:44 AM

## 2023-05-14 NOTE — Plan of Care (Signed)
  Problem: Education: Goal: Knowledge of General Education information will improve Description: Including pain rating scale, medication(s)/side effects and non-pharmacologic comfort measures Outcome: Progressing   Problem: Health Behavior/Discharge Planning: Goal: Ability to manage health-related needs will improve Outcome: Progressing   Problem: Clinical Measurements: Goal: Ability to maintain clinical measurements within normal limits will improve Outcome: Progressing Goal: Will remain free from infection Outcome: Progressing Goal: Diagnostic test results will improve Outcome: Progressing Goal: Respiratory complications will improve Outcome: Progressing Goal: Cardiovascular complication will be avoided Outcome: Progressing   Problem: Activity: Goal: Risk for activity intolerance will decrease Outcome: Progressing   Problem: Nutrition: Goal: Adequate nutrition will be maintained Outcome: Progressing   Problem: Coping: Goal: Level of anxiety will decrease Outcome: Progressing   Problem: Elimination: Goal: Will not experience complications related to bowel motility Outcome: Progressing Goal: Will not experience complications related to urinary retention Outcome: Progressing   Problem: Pain Managment: Goal: General experience of comfort will improve and/or be controlled Outcome: Progressing   Problem: Safety: Goal: Ability to remain free from injury will improve Outcome: Progressing   Problem: Skin Integrity: Goal: Risk for impaired skin integrity will decrease Outcome: Progressing   Problem: Education: Goal: Knowledge of the prescribed therapeutic regimen will improve Outcome: Progressing   Problem: Activity: Goal: Ability to implement measures to reduce episodes of fatigue will improve Outcome: Progressing   Problem: Bowel/Gastric: Goal: Will not experience complications related to bowel motility Outcome: Progressing   Problem: Coping: Goal: Ability to  identify and develop effective coping behavior will improve Outcome: Progressing   Problem: Nutritional: Goal: Maintenance of adequate nutrition will improve Outcome: Progressing   Problem: Education: Goal: Ability to describe self-care measures that may prevent or decrease complications (Diabetes Survival Skills Education) will improve Outcome: Progressing   Problem: Coping: Goal: Ability to adjust to condition or change in health will improve Outcome: Progressing   Problem: Fluid Volume: Goal: Ability to maintain a balanced intake and output will improve Outcome: Progressing   Problem: Health Behavior/Discharge Planning: Goal: Ability to identify and utilize available resources and services will improve Outcome: Progressing Goal: Ability to manage health-related needs will improve Outcome: Progressing   Problem: Metabolic: Goal: Ability to maintain appropriate glucose levels will improve Outcome: Progressing   Problem: Nutritional: Goal: Maintenance of adequate nutrition will improve Outcome: Progressing Goal: Progress toward achieving an optimal weight will improve Outcome: Progressing   Problem: Skin Integrity: Goal: Risk for impaired skin integrity will decrease Outcome: Progressing   Problem: Tissue Perfusion: Goal: Adequacy of tissue perfusion will improve Outcome: Progressing   Problem: Activity: Goal: Ability to tolerate increased activity will improve Outcome: Progressing   Problem: Respiratory: Goal: Ability to maintain a clear airway and adequate ventilation will improve Outcome: Progressing   Problem: Role Relationship: Goal: Method of communication will improve Outcome: Progressing

## 2023-05-15 ENCOUNTER — Inpatient Hospital Stay (HOSPITAL_COMMUNITY)

## 2023-05-15 DIAGNOSIS — G40909 Epilepsy, unspecified, not intractable, without status epilepticus: Secondary | ICD-10-CM | POA: Diagnosis not present

## 2023-05-15 DIAGNOSIS — I63519 Cerebral infarction due to unspecified occlusion or stenosis of unspecified middle cerebral artery: Secondary | ICD-10-CM | POA: Diagnosis not present

## 2023-05-15 DIAGNOSIS — C8339 Primary central nervous system lymphoma: Secondary | ICD-10-CM | POA: Diagnosis not present

## 2023-05-15 DIAGNOSIS — G936 Cerebral edema: Secondary | ICD-10-CM | POA: Diagnosis not present

## 2023-05-15 DIAGNOSIS — C8510 Unspecified B-cell lymphoma, unspecified site: Secondary | ICD-10-CM | POA: Diagnosis not present

## 2023-05-15 DIAGNOSIS — R569 Unspecified convulsions: Secondary | ICD-10-CM | POA: Diagnosis not present

## 2023-05-15 LAB — COMPREHENSIVE METABOLIC PANEL
ALT: 59 U/L — ABNORMAL HIGH (ref 0–44)
AST: 24 U/L (ref 15–41)
Albumin: 2.4 g/dL — ABNORMAL LOW (ref 3.5–5.0)
Alkaline Phosphatase: 60 U/L (ref 38–126)
Anion gap: 7 (ref 5–15)
BUN: 23 mg/dL (ref 8–23)
CO2: 27 mmol/L (ref 22–32)
Calcium: 8.4 mg/dL — ABNORMAL LOW (ref 8.9–10.3)
Chloride: 101 mmol/L (ref 98–111)
Creatinine, Ser: 0.73 mg/dL (ref 0.61–1.24)
GFR, Estimated: >60 mL/min (ref >60)
Glucose, Bld: 125 mg/dL — ABNORMAL HIGH (ref 70–99)
Potassium: 4.1 mmol/L (ref 3.5–5.1)
Sodium: 135 mmol/L (ref 135–145)
Total Bilirubin: 0.4 mg/dL (ref 0.0–1.2)
Total Protein: 5.2 g/dL — ABNORMAL LOW (ref 6.5–8.1)

## 2023-05-15 LAB — CBC WITH DIFFERENTIAL/PLATELET
Abs Immature Granulocytes: 0.09 10*3/uL — ABNORMAL HIGH (ref 0.00–0.07)
Basophils Absolute: 0 10*3/uL (ref 0.0–0.1)
Basophils Relative: 0 %
Eosinophils Absolute: 0 10*3/uL (ref 0.0–0.5)
Eosinophils Relative: 0 %
HCT: 34.3 % — ABNORMAL LOW (ref 39.0–52.0)
Hemoglobin: 11.4 g/dL — ABNORMAL LOW (ref 13.0–17.0)
Immature Granulocytes: 1 %
Lymphocytes Relative: 12 %
Lymphs Abs: 1.2 10*3/uL (ref 0.7–4.0)
MCH: 30.3 pg (ref 26.0–34.0)
MCHC: 33.2 g/dL (ref 30.0–36.0)
MCV: 91.2 fL (ref 80.0–100.0)
Monocytes Absolute: 1.4 10*3/uL — ABNORMAL HIGH (ref 0.1–1.0)
Monocytes Relative: 14 %
Neutro Abs: 7.3 10*3/uL (ref 1.7–7.7)
Neutrophils Relative %: 73 %
Platelets: 237 10*3/uL (ref 150–400)
RBC: 3.76 MIL/uL — ABNORMAL LOW (ref 4.22–5.81)
RDW: 13.6 % (ref 11.5–15.5)
WBC: 10 10*3/uL (ref 4.0–10.5)
nRBC: 0 % (ref 0.0–0.2)

## 2023-05-15 LAB — LIPID PANEL
Cholesterol: 167 mg/dL (ref 0–200)
HDL: 58 mg/dL (ref >40)
LDL Cholesterol: 97 mg/dL (ref 0–99)
Total CHOL/HDL Ratio: 2.9 ratio
Triglycerides: 59 mg/dL (ref <150)
VLDL: 12 mg/dL (ref 0–40)

## 2023-05-15 LAB — GLUCOSE, CAPILLARY
Glucose-Capillary: 147 mg/dL — ABNORMAL HIGH (ref 70–99)
Glucose-Capillary: 82 mg/dL (ref 70–99)
Glucose-Capillary: 96 mg/dL (ref 70–99)
Glucose-Capillary: 129 mg/dL — ABNORMAL HIGH (ref 70–99)
Glucose-Capillary: 132 mg/dL — ABNORMAL HIGH (ref 70–99)
Glucose-Capillary: 93 mg/dL (ref 70–99)
Glucose-Capillary: 108 mg/dL — ABNORMAL HIGH (ref 70–99)
Glucose-Capillary: 129 mg/dL — ABNORMAL HIGH (ref 70–99)

## 2023-05-15 LAB — MAGNESIUM: Magnesium: 1.9 mg/dL (ref 1.7–2.4)

## 2023-05-15 LAB — PHOSPHORUS: Phosphorus: 4.4 mg/dL (ref 2.5–4.6)

## 2023-05-15 MED ORDER — ONDANSETRON HCL 4 MG/2ML IJ SOLN
4.0000 mg | Freq: Four times a day (QID) | INTRAMUSCULAR | Status: DC | PRN
  Administered 2023-05-21: 4 mg via INTRAVENOUS
  Filled 2023-05-15: qty 2

## 2023-05-15 NOTE — Progress Notes (Signed)
EEG LTM D/C'd. No skin break down noted. [facility] notified.  

## 2023-05-15 NOTE — Progress Notes (Signed)
 NEUROLOGY CONSULT FOLLOW UP NOTE   Date of service: May 15, 2023 Patient Name: Tyler [name] MRN:  [id number] DOB:  1950-07-15  Interval Hx/subjective   Much improved today. Following commands.  No seizures on LTM EEG.  Vitals   Vitals:   05/15/23 0730 05/15/23 0750 05/15/23 0816 05/15/23 1156  BP:  126/81 132/89 126/75  Pulse:  77 80 74  Resp:  17 15 16   Temp:  97.8 F (36.6 C) 97.7 F (36.5 C) 99 F (37.2 C)  TempSrc:  Oral Oral Oral  SpO2:  100% 100% 99%  Weight: 87.6 kg     Height:         Body mass index is 30.24 kg/m.  Exam   Gen: eyes open, makes consistent eye contact. Makes consistent eye contact on left and right. Speech: mute, follows commands in RUE and RLE. Attempts to follow command in LUE. CN: PERRL, EOMI, visual fields full.  Prominent L facial droop. Motor & sensory: moves RUE and RLE antigravity and on command. Moves LUE to command but no antigravity movements noted.  Medications  Current Facility-Administered Medications:    acetaminophen (TYLENOL) tablet 650 mg, 650 mg, Per Tube, Q4H PRN, Kalman Shan, MD, 650 mg at 05/15/23 0154   alum & mag hydroxide-simeth (MAALOX/MYLANTA) 200-200-20 MG/5ML suspension 60 mL, 60 mL, Per Tube, Q4H PRN, Kalman Shan, MD, 60 mL at 05/11/23 0000   aspirin suppository 300 mg, 300 mg, Rectal, Daily, Rhetta Mura, MD, 300 mg at 05/15/23 0827   Chlorhexidine Gluconate Cloth 2 % PADS 6 each, 6 each, Topical, Nightly, Ranee Gosselin, MD, 6 each at 05/15/23 0800   cholecalciferol (VITAMIN D3) 25 MCG (1000 UNIT) tablet 2,000 Units, 2,000 Units, Per Tube, Daily, Kalman Shan, MD, 2,000 Units at 05/15/23 0827   cyanocobalamin (VITAMIN B12) tablet 1,000 mcg, 1,000 mcg, Per Tube, Daily, Luciano Cutter, MD, 1,000 mcg at 05/15/23 3474   dexamethasone (DECADRON) tablet 4 mg, 4 mg, Per Tube, Q12H, Johney Maine, MD, 4 mg at 05/15/23 0827   enoxaparin (LOVENOX) injection 40 mg, 40 mg, Subcutaneous,  Q24H, Johney Maine, MD, 40 mg at 05/14/23 2207   feeding supplement (OSMOLITE 1.5 CAL) liquid 1,000 mL, 1,000 mL, Per Tube, Continuous, Luciano Cutter, MD, Last Rate: 55 mL/hr at 05/14/23 1150, 1,000 mL at 05/14/23 1150   feeding supplement (PROSource TF20) liquid 60 mL, 60 mL, Per Tube, BID, Luciano Cutter, MD, 60 mL at 05/15/23 0826   fiber supplement (BANATROL TF) liquid 60 mL, 60 mL, Per Tube, TID, Rhetta Mura, MD, 60 mL at 05/15/23 2595   folic acid (FOLVITE) tablet 1 mg, 1 mg, Per Tube, Daily, Luciano Cutter, MD, 1 mg at 05/15/23 0827   gabapentin (NEURONTIN) 250 MG/5ML solution 100 mg, 100 mg, Per Tube, Q8H, Mannam, Praveen, MD, 100 mg at 05/15/23 1243   hydrALAZINE (APRESOLINE) tablet 50 mg, 50 mg, Per Tube, Q8H PRN, Luciano Cutter, MD   hydrocortisone (ANUSOL-HC) 2.5 % rectal cream 1 Application, 1 Application, Rectal, BID PRN, Johney Maine, MD   ibuprofen (ADVIL) 100 MG/5ML suspension 200 mg, 200 mg, Per Tube, Q6H PRN, Kalman Shan, MD, 200 mg at 05/09/23 1456   insulin aspart (novoLOG) injection 0-15 Units, 0-15 Units, Subcutaneous, Q4H, Luciano Cutter, MD, 2 Units at 05/15/23 1243   lacosamide (VIMPAT) tablet 200 mg, 200 mg, Per Tube, BID, Charlsie Quest, MD, 200 mg at 05/15/23 0827   levETIRAcetam (KEPPRA) 100 MG/ML solution 2,000 mg,  2,000 mg, Per Tube, BID, Charlsie Quest, MD, 2,000 mg at 05/15/23 0826   lidocaine (LMX) 4 % cream, , Topical, TID PRN, Jimmye Norman, NP   LORazepam (ATIVAN) injection 2 mg, 2 mg, Intravenous, Q4H PRN, Charlsie Quest, MD   magic mouthwash w/lidocaine, 5 mL, Oral, QID PRN, Candise Che, Corene Cornea, MD   ondansetron [facility]) injection 4 mg, 4 mg, Intravenous, Q6H PRN, Alanda Slim, Taye T, MD   Oral care mouth rinse, 15 mL, Mouth Rinse, PRN, Mahala Menghini, Jai-Gurmukh, MD   polyethylene glycol (MIRALAX / GLYCOLAX) packet 17 g, 17 g, Per Tube, Daily PRN, Leander Rams, RPH   scopolamine (TRANSDERM-SCOP) 1  MG/3DAYS 1.5 mg, 1 patch, Transdermal, Q72H, Agarwala, Ravi, MD, 1.5 mg at 05/15/23 1456   senna-docusate (Senokot-S) tablet 1 tablet, 1 tablet, Per Tube, QHS PRN, Alphia Moh D, RPH   sodium chloride flush (NS) 0.9 % injection 10-40 mL, 10-40 mL, Intracatheter, Q12H, Johney Maine, MD, 10 mL at 05/15/23 0827   sodium chloride flush (NS) 0.9 % injection 10-40 mL, 10-40 mL, Intracatheter, PRN, Johney Maine, MD, 30 mL at 04/27/23 0540   sodium chloride flush (NS) 0.9 % injection 3-10 mL, 3-10 mL, Intravenous, Q12H, Agarwala, Ravi, MD, 10 mL at 05/15/23 0827   sodium chloride flush (NS) 0.9 % injection 3-10 mL, 3-10 mL, Intravenous, PRN, Lynnell Catalan, MD   tenofovir alafenamide (VEMLIDY) tablet 25 mg, 25 mg, Per Tube, Daily, Danelle Earthly, MD, 25 mg at 05/15/23 0827   thiamine (VITAMIN B1) injection 100 mg, 100 mg, Intravenous, Daily, Alanda Slim, Taye T, MD, 100 mg at 05/15/23 0827  Labs and Diagnostic Imaging   CBC:  Recent Labs  Lab 05/12/23 1017 05/15/23 0718  WBC 7.7 10.0  NEUTROABS 5.1 7.3  HGB 12.0* 11.4*  HCT 36.5* 34.3*  MCV 90.3 91.2  PLT 284 237    Basic Metabolic Panel:  Lab Results  Component Value Date   NA 135 05/15/2023   K 4.1 05/15/2023   CO2 27 05/15/2023   GLUCOSE 125 (H) 05/15/2023   BUN 23 05/15/2023   CREATININE 0.73 05/15/2023   CALCIUM 8.4 (L) 05/15/2023   GFRNONAA >60 05/15/2023   GFRAA >60 09/11/2019   Lipid Panel:  Lab Results  Component Value Date   LDLCALC 97 05/15/2023   HgbA1c:  Lab Results  Component Value Date   HGBA1C 5.2 04/09/2023   Urine Drug Screen: No results found for: "LABOPIA", "COCAINSCRNUR", "LABBENZ", "AMPHETMU", "THCU", "LABBARB"  Alcohol Level No results found for: "ETH" INR  Lab Results  Component Value Date   INR 1.2 04/07/2023   APTT  Lab Results  Component Value Date   APTT 28 07/29/2022   AED levels: No results found for: "PHENYTOIN", "ZONISAMIDE", "LAMOTRIGINE", "LEVETIRACETA"  No new imaging  to review since note yesterday.  LTM EEG: This study showed evidence of epileptogenicity arising from right hemisphere, maximal right parietal region. Additionally there is cortical dysfunction in right hemisphere, maximal right parietal region likely secondary to underlying structural abnormality.  Lastly there is mild diffuse encephalopathy. No  seizures were noted.     Assessment   This is a 73 year old male with history of A-fib not on anticoagulation due to bleeding risk, prostate cancer status post prostatectomy, PE, hep B exposure, CNS recent labs of large B-cell lymphoma, focal seizures on Keppra who was admitted for inpatient chemo and was noted to have left facial twitching consistent with focal seizures.  His seizures were improved on vimpat, keppra, and  gabapentin. He was seizure free by EEG for >24 hrs and therefore EEG was removed.   MRI brain 3/11 showed a different pattern of restricted diffusion and edema that we are concerned represents MCA branch infarction.  The distribution does involve the anterior temporal lobe as well which would be unusual as well as thalamic involvement so I think that we are dealing with multiple etiologies for the current appearance.  Part of this may be postictal change. However the isolation of the cytotoxic edema to a MCA branch distribution is highly suggestive that he has had an ischemic stroke in addition to his seizures. Stroke workup completed.  He was initially improving and was noted to have some movement in LUE, interactive and following commands and answering questions for family on 3/17. However, noted to decline on 3/18 and 3/19 and noted to be flaccid in LUE and LLE and much less interactive.  He was put up on LTM EEG which was negative for seizures. He is more interactive today and follows commands in RUE and RLE, and some movement in LUE althou no antigravity movements noted in LUE.  With his spontaneous improvement today and no seizures  on LTM EEG, I suspect that his decline was likely due to waxing and waning delirium.   Impression:   Epilepsy with breakthrough seizure Cerebral edema B-cell lymphoma -In the setting of underlying lymphoma and edema  Recommendations   # Acute ischemic stroke - Stroke workup completed - Goal normotension, avoid hypotension - continue Aspirin 81mg  daily. - Lipid profile pending. - Tele - PT/OT/SLP - Stroke education  # Seizures,  -Continue Vimpat  200 mg twice daily -Continue Keppra 2000 mg twice daily -Continue gabapentin 100 mg 3 times daily - no seizures on LTM 3/19-3/20.  Delirium: - delirium precautions. Minimize vital checks overnight if safe to do so.  Neurology will signoff. Please feel free to contact us with any questions or concerns.   ______________________________________________________________________  Plan discussed with daughter over phone, with wife at bedside, daughter Karena Addison over phone and with Dr. Alanda Slim with the hospitalist team in person.  Signed, Erick Blinks [facility]

## 2023-05-15 NOTE — Procedures (Addendum)
 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 EEG to evaluate for seizure    Level of alertness: awake, asleep   AEDs during EEG study: LEV, LCM, GBP   Technical aspects: This EEG study was done with scalp electrodes positioned according to the 10-20 International system of electrode placement. Electrical activity was reviewed with band pass filter of 1-70Hz , sensitivity of 7 uV/mm, display speed of 70mm/sec with a 60Hz  notched filter applied as appropriate. EEG data were recorded continuously and digitally stored.  Video monitoring was available and reviewed as appropriate.   Description:  The posterior dominant rhythm consists of 8-9 Hz activity of moderate voltage (25-35 uV) seen predominantly in posterior head regions, asymmetric ( right<left) and reactive to eye opening and eye closing. Sleep was characterized by sleep spindles (12 to 14 Hz), maximal frontocentral region.  EEG showed continuous generalized and maximal right parietal 3 to 6 Hz theta-delta slowing. Lateralized periodic discharges were noted in right hemisphere, maximal right parietal region with fluctuating frequency of 0.5-1Hz , predominantly when awake. Hyperventilation and photic stimulation were not performed.      ABNORMALITY -Lateralized periodic discharges, right hemisphere, maximal right parietal  region ( LPD) -Continuous slow, generalized and maximal right parietal region   IMPRESSION: This study showed evidence of epileptogenicity arising from right hemisphere, maximal right parietal region. Additionally there is cortical dysfunction in right hemisphere, maximal right parietal region likely secondary to underlying structural abnormality.  Lastly there is mild diffuse encephalopathy. No  seizures  were noted.   Sydnei Ohaver [name]

## 2023-05-15 NOTE — Progress Notes (Signed)
 Speech Language Pathology Treatment: Dysphagia  Patient Details Name: Tyler [name] MRN: [id number] DOB: 1950/08/13 Today's Date: 05/15/2023 Time: [id number] SLP Time Calculation (min) (ACUTE ONLY): 19 min  Assessment / Plan / Recommendation Clinical Impression  Pt seen for dysphagia treatment with wife present and interpreting. Pt continues to exhibit significant dysphagia marked by decreased secretion management noted to cough on secretions, suspected pharyngeal congestion with wet sounding respirations, oral holding and suspected delayed swallow initiation. He allowed oral care with suction toothbrush clenching teeth and SLP eventually able to reach beyond dentition several times and when pt yawned removed mild phlegm from hard palate. He was eager to eat, nodding head at the sight of applesauce. Pt swallowed one bite of applesauce and orally held additional trials requiring suctioning to remove. He did initiate swallow with teaspoon sip water x 1. Pharyngeal congestion persisted but no direct s/s aspiration. When cued to cough it resulted in a moderately weak throat clear. He will need repeat instrumental assessment to evaluate oropharyngeal swallow however is not yet appropriate. ST will continue treatment. Following oral care pt could have several ice chips with nursing with full supervision.    HPI HPI: 73 yo male presents to [facility] on 2/10 with LUE and LE weakness, and change in mentation. Head CT of brain shows a recurrent tumor at the craniotomy site and increased size of recurrent tumor along with a left midline shift up to 6 mm.  Hospital course complicated by concern for neck swelling, acute dysphagia, and concern for airway protection prompting intubation from 02/28-03/02. Neck CT on 04/26/23: demonstrated "No acute finding. Assessment of mucosal surfaces limited by airway collapse in the setting of intubation." MBS 3/3 showed an oral more than pharyngeal dysphagia although with silent aspiration x1  with thin liquids. Aspiration risk was felt to be mostly related to impulsivity/mentation, and he did progress from full liquid to pureed diet. Seizure activity increased though resulting in reintubation 3/8-3/14. Pt's swallowing was previously evaluated when he was admitted in June of 2024 - he was D/Cd on a dysphagia 3 diet with thin liquids with no f/u recommended. Pt's swallowing assessed again on 04/08/23 this admission with recommendation of a regular diet and thin liquids.  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      SLP Plan  Continue with current plan of care      Recommendations for follow up therapy are one component of a multi-disciplinary discharge planning process, led by the attending physician.  Recommendations may be updated based on patient status, additional functional criteria and insurance authorization.    Recommendations  Diet recommendations: NPO Medication Administration: Via alternative means                  Oral care QID;Staff/trained caregiver to provide oral care   Frequent or constant Supervision/Assistance Dysphagia, oropharyngeal phase (R13.12)     Continue with current plan of care     Royce Macadamia  05/15/2023, 3:16 PM

## 2023-05-15 NOTE — Progress Notes (Signed)
 PROGRESS NOTE  Tyler [name] ZOX:[id number] DOB: [birth year]   PCP: Nechama Guard, FNP  Patient is from: Home.  DOA: 04/22/2023 LOS: 23  Chief complaints No chief complaint on file.    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  Patient developed neck swelling, drooling, difficulty swallowing and altered mental status with concern for partial seizure.  Pulmonology consulted on 2/28.  He was intubated and admitted to ICU.  Core track placed on 3/1.  MRI brain without contrast on 3/11 showed resolving diffusion abnormality within the posterior right MCA territory and increased extent of hyperintense T2 weighted signal in right hemisphere concerning for progression of lymphoma or confluence of postischemic/postictal edema and lymphoma related vasogenic edema.  Eventually extubated on 3/2 but continued to have focal seizure.  He was reintubated and finally extubated on 3/14 again.  He was transferred to hospitalist service on 3/17.  Oncology, neurology and palliative medicine following.  Subjective: Seen and examined earlier this morning with the help of video interpreter with ID number [id number].  He is awake and alert but not able to speak clearly due to severe dysarthria/aphasia.  He follows commands.  He has left hemiparesis with left facial droop.  Moves right arm and right leg.  Objective: Vitals:   05/15/23 0730 05/15/23 0750 05/15/23 0816 05/15/23 1156  BP:  126/81 132/89 126/75  Pulse:  77 80 74  Resp:  17 15 16   Temp:  97.8 F (36.6 C) 97.7 F  (36.5 C) 99 F (37.2 C)  TempSrc:  Oral Oral Oral  SpO2:  100% 100% 99%  Weight: 87.6 kg     Height:        Examination:  GENERAL: No apparent distress.  Nontoxic. HEENT: MMM.  Vision and hearing grossly intact.  NECK: Supple.  No apparent JVD.  RESP:  No IWOB.  Fair aeration bilaterally. CVS:  RRR. Heart sounds normal.  ABD/GI/GU: BS+. Abd soft, NTND.  MSK/EXT:  Moves right extremities.  Cannot move left extremities. No edema.  SKIN: no apparent skin lesion or wound NEURO: Awake and alert.  Follows commands.  Has severe dysarthria/aphasia.  Left hemiparesis and left facial droop.  Moves right side. PSYCH: Calm. Normal affect.   Consultants:  Hematology/oncology admitted patient Critical care Neurology Radiation oncology Palliative medicine  Procedures: Intubation and mechanical ventilation.  Microbiology summarized: 3/1-MRSA PCR screen nonreactive. 3/1-respiratory culture negative 3/1-a 20 pathogen RVP nonreactive 3/1-blood cultures NGTD 3/11-respiratory culture with abundant Pseudomonas aeruginosa 3/13-blood cultures NGTD   Assessment and plan: Acute ischemic stroke?  MRI on 3/11 showed resolving diffusion abnormality within the posterior right MCA territory, and increased extent of hyperintense T2 weighted signal in the right hemisphere concerning for progression of lymphoma or postischemic edema.  CT angio head and neck without LVO or high-grade stenosis.  TTE on 3/1 without significant finding.  LDL 97.  A1c 5.2% on 2/12. -Appreciated by neurology-continue aspirin 81 mg daily but getting rectal aspirin 300 mg daily now -PT, OT, SLP and telemetry  Focal seizure secondary to cerebral edema: LTM EEG on 3/3 with clinical signs of seizures arising from right  parietal region and evidence of epileptogenicity and cortical dysfunction arising from the right hemisphere.  Repeat LTM EEG on 3/20 with no seizure but evidence of epileptogenicity and cortisol dysfunction arising  from right hemisphere. -Appreciate help by neurology-Vimpat, Keppra and gabapentin -Continue Decadron -Seizure precaution  High-grade B-cell lymphoma with resection and recent chemo-primarily admitted second cycle of methotrexate and leucovorin -Oncology recommending goal of care discussion. -No clear role of radiation per radiation oncology. -Continue Decadron and Keppra. -Palliative medicine on board.  Dysphagia: Due to the above.  NPO. -Continue tube feed  Possible aspiration pneumonia: Respiratory culture with Pseudomonas. -Completed IV Zosyn from 3/13-3/18.  Paroxysmal atrial fibrillation: Rate controlled without meds.  Not on AC due to bleeding risk. -Optimize electrolytes   Previous PEs LLE DVT: No longer on AC.   Prostate cancer s/p prostatectomy   Hepatitis B -Continue tenofovir 25 daily   Class I obesity Body mass index is 30.24 kg/m. Nutrition Problem: Inadequate oral intake Etiology:  (seizures) Signs/Symptoms: NPO status Interventions: Tube feeding, Prostat   DVT prophylaxis:  SCDs Start: 04/25/23 2209 enoxaparin (LOVENOX) injection 40 mg Start: 04/22/23 2200  Code Status: Full code Family Communication: Updated patient's daughter over the phone on 3/19.  None at bedside today. Level of care: Progressive Status is: Inpatient Remains inpatient appropriate because: Seizure, diffuse B-cell lymphoma, acute CVA and dysphagia   Final disposition: To be determined   55 minutes with more than 50% spent in reviewing records, counseling patient/family and coordinating care.   Sch Meds:  Scheduled Meds:  aspirin  300 mg Rectal Daily   Chlorhexidine Gluconate Cloth  6 each Topical Nightly   cholecalciferol  2,000 Units Per Tube Daily   vitamin B-12  1,000 mcg Per Tube Daily   dexamethasone  4 mg Per Tube Q12H   enoxaparin (LOVENOX) injection  40 mg Subcutaneous Q24H   feeding supplement (PROSource TF20)  60 mL Per Tube BID   fiber supplement (BANATROL TF)   60 mL Per Tube TID   folic acid  1 mg Per Tube Daily   gabapentin  100 mg Per Tube Q8H   insulin aspart  0-15 Units Subcutaneous Q4H   lacosamide  200 mg Per Tube BID   levETIRAcetam  2,000 mg Per Tube BID   scopolamine  1 patch Transdermal Q72H   sodium chloride flush  10-40 mL Intracatheter Q12H   sodium chloride flush  3-10 mL Intravenous Q12H   tenofovir alafenamide  25 mg Per Tube Daily   thiamine (VITAMIN B1) injection  100 mg Intravenous Daily   Continuous Infusions:  feeding supplement (OSMOLITE 1.5 CAL) 1,000 mL (05/14/23 1150)   PRN Meds:.acetaminophen, alum & mag hydroxide-simeth, hydrALAZINE, hydrocortisone, ibuprofen, lidocaine, LORazepam, magic mouthwash w/lidocaine, ondansetron (ZOFRAN) IV, mouth rinse, polyethylene glycol, senna-docusate, sodium chloride flush, sodium chloride flush  Antimicrobials: Anti-infectives (From admission, onward)    Start     Dose/Rate Route Frequency Ordered Stop   05/09/23 1000  tenofovir alafenamide (VEMLIDY) tablet 25 mg        25 mg Per Tube Daily 05/08/23 1135     05/08/23 1245  piperacillin-tazobactam (ZOSYN) IVPB 3.375 g        3.375 g 12.5 mL/hr over 240 Minutes Intravenous Every 8 hours 05/08/23 1153 05/13/23 0955   05/08/23 1000  tenofovir (VIREAD) tablet 300 mg  Status:  Discontinued        300 mg Per Tube Daily 05/07/23 1143 05/08/23 1135   05/07/23 2300  vancomycin (VANCOCIN) IVPB 1000 mg/200  mL premix  Status:  Discontinued        1,000 mg 200 mL/hr over 60 Minutes Intravenous Every 12 hours 05/07/23 1218 05/08/23 1401   05/07/23 1015  vancomycin (VANCOREADY) IVPB 1750 mg/350 mL        1,750 mg 175 mL/hr over 120 Minutes Intravenous  Once 05/07/23 0921 05/07/23 1236   05/07/23 1015  ceFEPIme (MAXIPIME) 2 g in sodium chloride 0.9 % 100 mL IVPB  Status:  Discontinued        2 g 200 mL/hr over 30 Minutes Intravenous Every 8 hours 05/07/23 0921 05/08/23 1153   04/28/23 1000  tenofovir alafenamide (VEMLIDY) tablet 25 mg   Status:  Discontinued        25 mg Per Tube Daily 04/27/23 2309 05/07/23 1143   04/23/23 1000  tenofovir alafenamide (VEMLIDY) tablet 25 mg  Status:  Discontinued        25 mg Oral Daily 04/22/23 1520 04/27/23 2309        I have personally reviewed the following labs and images: CBC: Recent Labs  Lab 05/09/23 0407 05/11/23 0500 05/12/23 1017 05/15/23 0718  WBC 4.8 7.4 7.7 10.0  NEUTROABS  --   --  5.1 7.3  HGB 10.9* 11.8* 12.0* 11.4*  HCT 32.7* 35.3* 36.5* 34.3*  MCV 89.8 90.5 90.3 91.2  PLT 227 267 284 237   BMP &GFR Recent Labs  Lab 05/09/23 0407 05/11/23 0500 05/12/23 1017 05/15/23 0718  NA 130* 134* 136 135  K 4.2 4.2 4.3 4.1  CL 98 98 98 101  CO2 24 29 27 27   GLUCOSE 172* 129* 105* 125*  BUN 28* 22 21 23   CREATININE 0.87 0.73 0.71 0.73  CALCIUM 8.0* 8.4* 8.6* 8.4*  MG  --  2.1  --  1.9  PHOS  --  3.4  --  4.4   Estimated Creatinine Clearance: 89.5 mL/min (by C-G formula based on SCr of 0.73 mg/dL). Liver & Pancreas: Recent Labs  Lab 05/09/23 0407 05/12/23 1017 05/15/23 0718  AST 29 34 24  ALT 43 77* 59*  ALKPHOS 56 66 60  BILITOT 0.4 0.6 0.4  PROT 5.2* 5.6* 5.2*  ALBUMIN 2.1* 2.7* 2.4*   No results for input(s): "LIPASE", "AMYLASE" in the last 168 hours. No results for input(s): "AMMONIA" in the last 168 hours. Diabetic: No results for input(s): "HGBA1C" in the last 72 hours. Recent Labs  Lab 05/15/23 0347 05/15/23 0750 05/15/23 0818 05/15/23 1157 05/15/23 1241  GLUCAP 147* 96 82 129* 132*   Cardiac Enzymes: No results for input(s): "CKTOTAL", "CKMB", "CKMBINDEX", "TROPONINI" in the last 168 hours. No results for input(s): "PROBNP" in the last 8760 hours. Coagulation Profile: No results for input(s): "INR", "PROTIME" in the last 168 hours. Thyroid Function Tests: No results for input(s): "TSH", "T4TOTAL", "FREET4", "T3FREE", "THYROIDAB" in the last 72 hours. Lipid Profile: Recent Labs    05/15/23 0718  CHOL 167  HDL 58  LDLCALC 97   TRIG 59  CHOLHDL 2.9   Anemia Panel: No results for input(s): "VITAMINB12", "FOLATE", "FERRITIN", "TIBC", "IRON", "RETICCTPCT" in the last 72 hours. Urine analysis:    Component Value Date/Time   COLORURINE YELLOW 05/08/2023 1632   APPEARANCEUR HAZY (A) 05/08/2023 1632   LABSPEC >1.046 (H) 05/08/2023 1632   PHURINE 6.0 05/08/2023 1632   GLUCOSEU NEGATIVE 05/08/2023 1632   HGBUR NEGATIVE 05/08/2023 1632   BILIRUBINUR NEGATIVE 05/08/2023 1632   KETONESUR NEGATIVE 05/08/2023 1632   PROTEINUR NEGATIVE 05/08/2023 1632   NITRITE  NEGATIVE 05/08/2023 1632   LEUKOCYTESUR NEGATIVE 05/08/2023 1632   Sepsis Labs: Invalid input(s): "PROCALCITONIN", "LACTICIDVEN"  Microbiology: Recent Results (from the past 240 hours)  Culture, Respiratory w Gram Stain     Status: None   Collection Time: 05/06/23 11:18 AM   Specimen: Tracheal Aspirate; Respiratory  Result Value Ref Range Status   Specimen Description TRACHEAL ASPIRATE  Final   Special Requests NONE  Final   Gram Stain   Final    NO WBC SEEN MODERATE GRAM POSITIVE COCCI MODERATE GRAM NEGATIVE RODS Performed at [facility], [address], [place], [place] [address]    Culture ABUNDANT PSEUDOMONAS AERUGINOSA  Final   Report Status 05/08/2023 FINAL  Final   Organism ID, Bacteria PSEUDOMONAS AERUGINOSA  Final      Susceptibility   Pseudomonas aeruginosa - MIC*    CEFTAZIDIME 2 SENSITIVE Sensitive     CIPROFLOXACIN <=0.25 SENSITIVE Sensitive     GENTAMICIN 2 SENSITIVE Sensitive     IMIPENEM 1 SENSITIVE Sensitive     PIP/TAZO <=4 SENSITIVE Sensitive ug/mL    CEFEPIME 2 SENSITIVE Sensitive     * ABUNDANT PSEUDOMONAS AERUGINOSA  MRSA Next Gen by PCR, Nasal     Status: None   Collection Time: 05/08/23 11:20 AM   Specimen: Nasal Mucosa; Nasal Swab  Result Value Ref Range Status   MRSA by PCR Next Gen NOT DETECTED NOT DETECTED Final    Comment: (NOTE) The GeneXpert MRSA Assay (FDA approved for NASAL specimens only), is one  component of a comprehensive MRSA colonization surveillance program. It is not intended to diagnose MRSA infection nor to guide or monitor treatment for MRSA infections. Test performance is not FDA approved in patients less than 31 years old. Performed at [facility], [address] [place], [place] [address]   Culture, blood (Routine X 2) w Reflex to ID Panel     Status: None   Collection Time: 05/08/23  7:40 PM   Specimen: BLOOD RIGHT ARM  Result Value Ref Range Status   Specimen Description BLOOD RIGHT ARM  Final   Special Requests   Final    BOTTLES DRAWN AEROBIC ONLY Blood Culture results may not be optimal due to an inadequate volume of blood received in culture bottles   Culture   Final    NO GROWTH 5 DAYS Performed at [facility], [address] [place], [place] [address]    Report Status 05/13/2023 FINAL  Final  Culture, blood (Routine X 2) w Reflex to ID Panel     Status: None   Collection Time: 05/08/23  7:47 PM   Specimen: BLOOD RIGHT HAND  Result Value Ref Range Status   Specimen Description BLOOD RIGHT HAND  Final   Special Requests   Final    BOTTLES DRAWN AEROBIC ONLY Blood Culture results may not be optimal due to an inadequate volume of blood received in culture bottles   Culture   Final    NO GROWTH 5 DAYS Performed at [facility], [address], [place], [place] [address]    Report Status 05/13/2023 FINAL  Final    Radiology Studies: Overnight EEG with video Result Date: 05/15/2023 Charlsie Quest, MD     05/15/2023  9:23 AM Patient Name: Tyler [name] MRN: [id number] Epilepsy Attending: Charlsie Quest Referring Physician/Provider: Erick Blinks, MD Duration: 05/14/2023 1536 to 05/15/2023 0920  Patient history: 73yo M patient with rt parietal CNS lymphoma with left facial  twitching. EEG to evaluate for seizure  Level of alertness: awake, asleep  AEDs during EEG study: LEV, LCM, GBP  Technical aspects: This EEG study was done with  scalp electrodes positioned according to the 10-20 International system of electrode placement. Electrical activity was reviewed with band pass filter of 1-70Hz , sensitivity of 7 uV/mm, display speed of 22mm/sec with a 60Hz  notched filter applied as appropriate. EEG data were recorded continuously and digitally stored.  Video monitoring was available and reviewed as appropriate.  Description:  The posterior dominant rhythm consists of 8-9 Hz activity of moderate voltage (25-35 uV) seen predominantly in posterior head regions, asymmetric ( right<left) and reactive to eye opening and eye closing. Sleep was characterized by sleep spindles (12 to 14 Hz), maximal frontocentral region.  EEG showed continuous generalized and maximal right parietal 3 to 6 Hz theta-delta slowing. Lateralized periodic discharges were noted in right hemisphere, maximal right parietal region with fluctuating frequency of 0.5-1Hz , predominantly when awake. Hyperventilation and photic stimulation were not performed.    ABNORMALITY -Lateralized periodic discharges, right hemisphere, maximal right parietal  region ( LPD) -Continuous slow, generalized and maximal right parietal region  IMPRESSION: This study showed evidence of epileptogenicity arising from right hemisphere, maximal right parietal region. Additionally there is cortical dysfunction in right hemisphere, maximal right parietal region likely secondary to underlying structural abnormality.  Lastly there is mild diffuse encephalopathy. No  seizures were noted.  Priyanka [name]      Ignatz Deis T. Kimela Malstrom [facility]  If 7PM-7AM, please contact night-coverage [email or web address] 05/15/2023, 2:04 PM

## 2023-05-15 NOTE — Plan of Care (Signed)
 Updated patient's wife at bedside and patient's daughter over the phone this afternoon.

## 2023-05-15 NOTE — Progress Notes (Signed)
 Port able to flush without blood return, IV team consulted. Phlebotomist notified to draw lab peripheral at this time.

## 2023-05-15 NOTE — Progress Notes (Signed)
 LTM EEG hooked up and running - no initial skin breakdown - push button tested - [facility] monitoring. Hooked up 05/14/23

## 2023-05-16 DIAGNOSIS — R569 Unspecified convulsions: Secondary | ICD-10-CM | POA: Diagnosis not present

## 2023-05-16 DIAGNOSIS — C8339 Primary central nervous system lymphoma: Secondary | ICD-10-CM | POA: Diagnosis not present

## 2023-05-16 DIAGNOSIS — C8338 Diffuse large B-cell lymphoma, lymph nodes of multiple sites: Secondary | ICD-10-CM | POA: Diagnosis not present

## 2023-05-16 DIAGNOSIS — Z7189 Other specified counseling: Secondary | ICD-10-CM | POA: Diagnosis not present

## 2023-05-16 LAB — RENAL FUNCTION PANEL
Albumin: 2.4 g/dL — ABNORMAL LOW (ref 3.5–5.0)
Anion gap: 5 (ref 5–15)
BUN: 21 mg/dL (ref 8–23)
CO2: 28 mmol/L (ref 22–32)
Calcium: 8 mg/dL — ABNORMAL LOW (ref 8.9–10.3)
Chloride: 101 mmol/L (ref 98–111)
Creatinine, Ser: 0.68 mg/dL (ref 0.61–1.24)
GFR, Estimated: >60 mL/min (ref >60)
Glucose, Bld: 133 mg/dL — ABNORMAL HIGH (ref 70–99)
Phosphorus: 4.1 mg/dL (ref 2.5–4.6)
Potassium: 4.1 mmol/L (ref 3.5–5.1)
Sodium: 134 mmol/L — ABNORMAL LOW (ref 135–145)

## 2023-05-16 LAB — CBC
HCT: 33.1 % — ABNORMAL LOW (ref 39.0–52.0)
Hemoglobin: 10.9 g/dL — ABNORMAL LOW (ref 13.0–17.0)
MCH: 29.6 pg (ref 26.0–34.0)
MCHC: 32.9 g/dL (ref 30.0–36.0)
MCV: 89.9 fL (ref 80.0–100.0)
Platelets: 228 10*3/uL (ref 150–400)
RBC: 3.68 MIL/uL — ABNORMAL LOW (ref 4.22–5.81)
RDW: 13.5 % (ref 11.5–15.5)
WBC: 7.9 10*3/uL (ref 4.0–10.5)
nRBC: 0 % (ref 0.0–0.2)

## 2023-05-16 LAB — GLUCOSE, CAPILLARY
Glucose-Capillary: 129 mg/dL — ABNORMAL HIGH (ref 70–99)
Glucose-Capillary: 139 mg/dL — ABNORMAL HIGH (ref 70–99)
Glucose-Capillary: 123 mg/dL — ABNORMAL HIGH (ref 70–99)
Glucose-Capillary: 143 mg/dL — ABNORMAL HIGH (ref 70–99)
Glucose-Capillary: 123 mg/dL — ABNORMAL HIGH (ref 70–99)

## 2023-05-16 LAB — MAGNESIUM: Magnesium: 1.8 mg/dL (ref 1.7–2.4)

## 2023-05-16 NOTE — Progress Notes (Signed)
 Physical Therapy Treatment Patient Details Name: Tyler [name] MRN: [id number] DOB: December 21, 1950 Today's Date: 05/16/2023   History of Present Illness 73 yo male presents on 2/25 for second cycle of high dose methotrexate with leucovorin rescue. 2/27 MRI showed marked interval decrease in size of right parietal mass/lymphoma and new diffusion signal abnormality involving the right insular cortex and overlying right frontal lobe, favored to reflect sequelae of seizure. 2/28 transferred to ICU and intubated. 3/2 extubated. 3/8 with seizures and need for airway protection so re-intubated and continued to have breakthrough seizures; extubated 3/14. EEG with evidence of epileptogenicity arising from R hemisphere, maximal R parietal region and cortical dysfunction in R hemisphere. PMH: CNS relapse of large B-cell lymphoma, partial seizure, paroxysmal atrial fib not on anticoagulation due to bleeding risk, prostate cancer status post prostatectomy 2021, pulmonary embolism.    PT Comments  Patient progressing slowly.  Able to tolerate EOB for about 12 minutes and focus on sitting balance for midline position progressing in session with initial posterior and L lateral lean and R gaze preference.  Moving video interpreter to increase L side awareness and focus.  Patient initially nonverbal, though near end of session verbalizing through interpreter well.  He will continue to benefit from skilled PT in the acute setting and from post-acute inpatient rehab at d/c.     If plan is discharge home, recommend the following: A lot of help with walking and/or transfers;A lot of help with bathing/dressing/bathroom;Assistance with cooking/housework;Assistance with feeding;Direct supervision/assist for medications management;Direct supervision/assist for financial management;Assist for transportation;Help with stairs or ramp for entrance   Can travel by private vehicle     No  Equipment Recommendations  Other (comment) (TBA)     Recommendations for Other Services       Precautions / Restrictions Precautions Precautions: Fall;Other (comment) Recall of Precautions/Restrictions: Impaired Precaution/Restrictions Comments: cortrak     Mobility  Bed Mobility Overal bed mobility: Needs Assistance Bed Mobility: Supine to Sit, Sit to Supine     Supine to sit: Max assist, +2 for physical assistance Sit to supine: +2 for physical assistance, Total assist   General bed mobility comments: assist for lifting trunk and moving legs pt using rail to help some with cues with R UE    Transfers Overall transfer level: Needs assistance   Transfers: Sit to/from Stand Sit to Stand: Total assist, +2 physical assistance           General transfer comment: used bed pad to assist to stand partially x 3 reps pt initiating x 1 with anterior weight shift and using bed rail over time    Ambulation/Gait                   Stairs             Wheelchair Mobility     Tilt Bed    Modified Rankin (Stroke Patients Only) Modified Rankin (Stroke Patients Only) Pre-Morbid Rankin Score: Moderate disability Modified Rankin: Severe disability     Balance Overall balance assessment: Needs assistance Sitting-balance support: Feet supported Sitting balance-Leahy Scale: Poor Sitting balance - Comments: mod to mid A for EOB leaning to L and posterior and needing cues for anterior weight shift and holding rail on R side Postural control: Posterior lean, Left lateral lean   Standing balance-Leahy Scale: Zero Standing balance comment: heavy assist for initiating standing attempts  Communication Communication Communication: Impaired Factors Affecting Communication: Difficulty expressing self;Reduced clarity of speech;Other (comment)  Cognition Arousal: Alert Behavior During Therapy: Flat affect   PT - Cognitive impairments: Difficult to assess Difficult to assess due  to: Impaired communication, Non-English speaking                     PT - Cognition Comments: finally near end of session pt speaking a little through video interpreter Following commands: Impaired Following commands impaired: Follows one step commands inconsistently, Follows one step commands with increased time    Cueing Cueing Techniques: Verbal cues, Gestural cues, Visual cues  Exercises Other Exercises Other Exercises: PROM LE's    General Comments General comments (skin integrity, edema, etc.): mouth suction several times with pt spitting up clear mucous initially; RN aware      Pertinent Vitals/Pain Pain Assessment Breathing: normal Negative Vocalization: occasional moan/groan, low speech, negative/disapproving quality Facial Expression: sad, frightened, frown Body Language: relaxed Consolability: no need to console PAINAD Score: 2    Home Living                          Prior Function            PT Goals (current goals can now be found in the care plan section) Progress towards PT goals: Progressing toward goals    Frequency    Min 3X/week      PT Plan      Co-evaluation              AM-PAC PT "6 Clicks" Mobility   Outcome Measure    Help needed moving from lying on your back to sitting on the side of a flat bed without using bedrails?: Total Help needed moving to and from a bed to a chair (including a wheelchair)?: Total Help needed standing up from a chair using your arms (e.g., wheelchair or bedside chair)?: Total Help needed to walk in hospital room?: Total Help needed climbing 3-5 steps with a railing? : Total 6 Click Score: 5    End of Session Equipment Utilized During Treatment: Gait belt;Oxygen Activity Tolerance: Patient limited by fatigue Patient left: in bed;with call bell/phone within reach;with bed alarm set (chair position)   PT Visit Diagnosis: Other abnormalities of gait and mobility (R26.89);Other symptoms  and signs involving the nervous system (R29.898);Muscle weakness (generalized) (M62.81)     Time: 1133-1207 PT Time Calculation (min) (ACUTE ONLY): 34 min  Charges:    $Therapeutic Activity: 23-37 mins PT General Charges $$ ACUTE PT VISIT: 1 Visit                     Sheran Lawless, PT Acute Rehabilitation Services Office:[phone] 05/16/2023    Elray Mcgregor 05/16/2023, 6:19 PM

## 2023-05-16 NOTE — Progress Notes (Signed)
 PROGRESS NOTE  Tyler [name] NWG:[id number] DOB: 1950/08/24   PCP: Nechama Guard, FNP  Patient is from: Home.  DOA: 04/22/2023 LOS: 24  Chief complaints No chief complaint on file.    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  Patient developed neck swelling, drooling, difficulty swallowing and altered mental status with concern for partial seizure.  Pulmonology consulted on 2/28.  He was intubated and admitted to ICU.  Core track placed on 3/1.  MRI brain without contrast on 3/11 showed resolving diffusion abnormality within the posterior right MCA territory and increased extent of hyperintense T2 weighted signal in right hemisphere concerning for progression of lymphoma or confluence of postischemic/postictal edema and lymphoma related vasogenic edema.  Eventually extubated on 3/2 but continued to have focal seizure.  He was reintubated and finally extubated on 3/14 again.  He was transferred to hospitalist service on 3/17.  Oncology, neurology and palliative medicine following.  Subjective: Seen and examined earlier this morning.  No major events overnight of this morning.  He is awake and alert.  Very dense dysarthria/aphasia.  He states "I am dead".  Able to tell me his first name.  Objective: Vitals:   05/16/23 0309 05/16/23 0401 05/16/23 0836 05/16/23 1217  BP: (!) 137/95  119/80 (!) 137/92  Pulse: 94  84 78  Resp: 18  16 18   Temp: 98.7 F (37.1 C)  98 F (36.7 C) 98.4 F (36.9 C)  TempSrc: Oral   Oral  SpO2: 99%  100% 95%  Weight:   84.9 kg    Height:        Examination:  GENERAL: No apparent distress.  Nontoxic. HEENT: MMM.  Vision and hearing grossly intact.  NECK: Supple.  No apparent JVD.  RESP:  No IWOB.  Fair aeration bilaterally. CVS:  RRR. Heart sounds normal.  ABD/GI/GU: BS+. Abd soft, NTND.  MSK/EXT:  Moves right extremities.  Cannot move left extremities. No edema.  SKIN: no apparent skin lesion or wound NEURO: Awake and alert.  Oriented to self.  Severe aphasia/dysarthria.  Left hemiparesis but able to raise his left arm off the bed.  Not able to raise his left leg.  Left facial droop. PSYCH: Calm. Normal affect.   Consultants:  Hematology/oncology admitted patient Critical care Neurology Radiation oncology Palliative medicine  Procedures: Intubation and mechanical ventilation.  Microbiology summarized: 3/1-MRSA PCR screen nonreactive. 3/1-respiratory culture negative 3/1-a 20 pathogen RVP nonreactive 3/1-blood cultures NGTD 3/11-respiratory culture with abundant Pseudomonas aeruginosa 3/13-blood cultures NGTD   Assessment and plan: Acute ischemic stroke: MRI on 3/11 showed resolving diffusion abnormality within the posterior right MCA territory, and increased extent of hyperintense T2 weighted signal in the right hemisphere concerning for progression of lymphoma or postischemic edema.  CT angio head and neck without LVO or high-grade stenosis.  TTE on 3/1 without significant finding.  LDL 97.  A1c 5.2% on 2/12. -Appreciated by neurology-continue aspirin 81 mg daily but getting rectal aspirin 300 mg daily now -PT, OT, SLP and telemetry  Focal seizure secondary to cerebral edema: LTM EEG on 3/3 with clinical signs of seizures arising from  right parietal region and evidence of epileptogenicity and cortical dysfunction arising from the right hemisphere.  Repeat LTM EEG on 3/20 with no seizure but evidence of epileptogenicity and cortisol dysfunction arising from right hemisphere. -On Vimpat,  Keppra and gabapentin per neurology. -Continue Decadron -Seizure precaution -Neurology signed off.  High-grade B-cell lymphoma with resection and recent chemo-primarily admitted second cycle of methotrexate and leucovorin -Oncology recommending goal of care discussion. -No clear role of radiation per radiation oncology. -Continue Decadron and Keppra. -Palliative medicine on board.  Dysphagia: Due to the above.  NPO. -Has been on tube feed via cortrack for 3 weeks now.   -Discussed with patient's daughter.  PEG tube if no improvement over the weekend.  Possible aspiration pneumonia: Respiratory culture with Pseudomonas. -Completed IV Zosyn from 3/13-3/18.  Paroxysmal atrial fibrillation: Rate controlled without meds.  Not on AC due to bleeding risk. -Optimize electrolytes   Previous PEs LLE DVT: No longer on AC.   Prostate cancer s/p prostatectomy   Hepatitis B -Continue tenofovir 25 daily   Class I obesity Body mass index is 29.31 kg/m. Nutrition Problem: Inadequate oral intake Etiology:  (seizures) Signs/Symptoms: NPO status Interventions: Tube feeding, Prostat   DVT prophylaxis:  SCDs Start: 04/25/23 2209 enoxaparin (LOVENOX) injection 40 mg Start: 04/22/23 2200  Code Status: Full code Family Communication: Updated patient's daughter over the phone Level of care: Progressive Status is: Inpatient Remains inpatient appropriate because: Seizure, diffuse B-cell lymphoma, acute CVA and dysphagia   Final disposition: To be determined   55 minutes with more than 50% spent in reviewing records, counseling patient/family and coordinating care.   Sch Meds:  Scheduled Meds:  aspirin  300 mg Rectal Daily   Chlorhexidine Gluconate Cloth  6 each Topical Nightly   cholecalciferol  2,000 Units Per Tube Daily   vitamin B-12  1,000 mcg Per Tube Daily   dexamethasone  4 mg Per Tube Q12H   enoxaparin (LOVENOX) injection  40 mg Subcutaneous Q24H   feeding supplement  (PROSource TF20)  60 mL Per Tube BID   fiber supplement (BANATROL TF)  60 mL Per Tube TID   folic acid  1 mg Per Tube Daily   gabapentin  100 mg Per Tube Q8H   insulin aspart  0-15 Units Subcutaneous Q4H   lacosamide  200 mg Per Tube BID   levETIRAcetam  2,000 mg Per Tube BID   scopolamine  1 patch Transdermal Q72H   sodium chloride flush  10-40 mL Intracatheter Q12H   sodium chloride flush  3-10 mL Intravenous Q12H   tenofovir alafenamide  25 mg Per Tube Daily   thiamine (VITAMIN B1) injection  100 mg Intravenous Daily   Continuous Infusions:  feeding supplement (OSMOLITE 1.5 CAL) 55 mL/hr at 05/16/23 1214   PRN Meds:.acetaminophen, alum & mag hydroxide-simeth, hydrALAZINE, hydrocortisone, ibuprofen, lidocaine, LORazepam, magic mouthwash w/lidocaine, ondansetron (ZOFRAN) IV, mouth rinse, polyethylene glycol, senna-docusate, sodium chloride flush, sodium chloride flush  Antimicrobials: Anti-infectives (From admission, onward)    Start     Dose/Rate Route Frequency Ordered Stop   05/09/23 1000  tenofovir alafenamide (VEMLIDY) tablet 25 mg        25 mg Per Tube Daily 05/08/23 1135     05/08/23 1245  piperacillin-tazobactam (ZOSYN) IVPB 3.375 g        3.375 g 12.5 mL/hr over 240 Minutes Intravenous Every 8 hours 05/08/23 1153 05/13/23 0955   05/08/23 1000  tenofovir (VIREAD) tablet 300 mg  Status:  Discontinued  300 mg Per Tube Daily 05/07/23 1143 05/08/23 1135   05/07/23 2300  vancomycin (VANCOCIN) IVPB 1000 mg/200 mL premix  Status:  Discontinued        1,000 mg 200 mL/hr over 60 Minutes Intravenous Every 12 hours 05/07/23 1218 05/08/23 1401   05/07/23 1015  vancomycin (VANCOREADY) IVPB 1750 mg/350 mL        1,750 mg 175 mL/hr over 120 Minutes Intravenous  Once 05/07/23 0921 05/07/23 1236   05/07/23 1015  ceFEPIme (MAXIPIME) 2 g in sodium chloride 0.9 % 100 mL IVPB  Status:  Discontinued        2 g 200 mL/hr over 30 Minutes Intravenous Every 8 hours 05/07/23 0921 05/08/23  1153   04/28/23 1000  tenofovir alafenamide (VEMLIDY) tablet 25 mg  Status:  Discontinued        25 mg Per Tube Daily 04/27/23 2309 05/07/23 1143   04/23/23 1000  tenofovir alafenamide (VEMLIDY) tablet 25 mg  Status:  Discontinued        25 mg Oral Daily 04/22/23 1520 04/27/23 2309        I have personally reviewed the following labs and images: CBC: Recent Labs  Lab 05/11/23 0500 05/12/23 1017 05/15/23 0718 05/16/23 0521  WBC 7.4 7.7 10.0 7.9  NEUTROABS  --  5.1 7.3  --   HGB 11.8* 12.0* 11.4* 10.9*  HCT 35.3* 36.5* 34.3* 33.1*  MCV 90.5 90.3 91.2 89.9  PLT 267 284 237 228   BMP &GFR Recent Labs  Lab 05/11/23 0500 05/12/23 1017 05/15/23 0718 05/16/23 0521  NA 134* 136 135 134*  K 4.2 4.3 4.1 4.1  CL 98 98 101 101  CO2 29 27 27 28   GLUCOSE 129* 105* 125* 133*  BUN 22 21 23 21   CREATININE 0.73 0.71 0.73 0.68  CALCIUM 8.4* 8.6* 8.4* 8.0*  MG 2.1  --  1.9 1.8  PHOS 3.4  --  4.4 4.1   Estimated Creatinine Clearance: 88.2 mL/min (by C-G formula based on SCr of 0.68 mg/dL). Liver & Pancreas: Recent Labs  Lab 05/12/23 1017 05/15/23 0718 05/16/23 0521  AST 34 24  --   ALT 77* 59*  --   ALKPHOS 66 60  --   BILITOT 0.6 0.4  --   PROT 5.6* 5.2*  --   ALBUMIN 2.7* 2.4* 2.4*   No results for input(s): "LIPASE", "AMYLASE" in the last 168 hours. No results for input(s): "AMMONIA" in the last 168 hours. Diabetic: No results for input(s): "HGBA1C" in the last 72 hours. Recent Labs  Lab 05/15/23 1932 05/15/23 2317 05/16/23 0304 05/16/23 0836 05/16/23 1124  GLUCAP 108* 129* 129* 139* 123*   Cardiac Enzymes: No results for input(s): "CKTOTAL", "CKMB", "CKMBINDEX", "TROPONINI" in the last 168 hours. No results for input(s): "PROBNP" in the last 8760 hours. Coagulation Profile: No results for input(s): "INR", "PROTIME" in the last 168 hours. Thyroid Function Tests: No results for input(s): "TSH", "T4TOTAL", "FREET4", "T3FREE", "THYROIDAB" in the last 72  hours. Lipid Profile: Recent Labs    05/15/23 0718  CHOL 167  HDL 58  LDLCALC 97  TRIG 59  CHOLHDL 2.9   Anemia Panel: No results for input(s): "VITAMINB12", "FOLATE", "FERRITIN", "TIBC", "IRON", "RETICCTPCT" in the last 72 hours. Urine analysis:    Component Value Date/Time   COLORURINE YELLOW 05/08/2023 1632   APPEARANCEUR HAZY (A) 05/08/2023 1632   LABSPEC >1.046 (H) 05/08/2023 1632   PHURINE 6.0 05/08/2023 1632   GLUCOSEU NEGATIVE 05/08/2023 1632  HGBUR NEGATIVE 05/08/2023 1632   BILIRUBINUR NEGATIVE 05/08/2023 1632   KETONESUR NEGATIVE 05/08/2023 1632   PROTEINUR NEGATIVE 05/08/2023 1632   NITRITE NEGATIVE 05/08/2023 1632   LEUKOCYTESUR NEGATIVE 05/08/2023 1632   Sepsis Labs: Invalid input(s): "PROCALCITONIN", "LACTICIDVEN"  Microbiology: Recent Results (from the past 240 hours)  MRSA Next Gen by PCR, Nasal     Status: None   Collection Time: 05/08/23 11:20 AM   Specimen: Nasal Mucosa; Nasal Swab  Result Value Ref Range Status   MRSA by PCR Next Gen NOT DETECTED NOT DETECTED Final    Comment: (NOTE) The GeneXpert MRSA Assay (FDA approved for NASAL specimens only), is one component of a comprehensive MRSA colonization surveillance program. It is not intended to diagnose MRSA infection nor to guide or monitor treatment for MRSA infections. Test performance is not FDA approved in patients less than 54 years old. Performed at [facility], [address], [place], [place] [address]   Culture, blood (Routine X 2) w Reflex to ID Panel     Status: None   Collection Time: 05/08/23  7:40 PM   Specimen: BLOOD RIGHT ARM  Result Value Ref Range Status   Specimen Description BLOOD RIGHT ARM  Final   Special Requests   Final    BOTTLES DRAWN AEROBIC ONLY Blood Culture results may not be optimal due to an inadequate volume of blood received in culture bottles   Culture   Final    NO GROWTH 5 DAYS Performed at [facility], [address] [place],  [place] [address]    Report Status 05/13/2023 FINAL  Final  Culture, blood (Routine X 2) w Reflex to ID Panel     Status: None   Collection Time: 05/08/23  7:47 PM   Specimen: BLOOD RIGHT HAND  Result Value Ref Range Status   Specimen Description BLOOD RIGHT HAND  Final   Special Requests   Final    BOTTLES DRAWN AEROBIC ONLY Blood Culture results may not be optimal due to an inadequate volume of blood received in culture bottles   Culture   Final    NO GROWTH 5 DAYS Performed at [facility], [address] [place], [place] [address]    Report Status 05/13/2023 FINAL  Final    Radiology Studies: No results found.     Verina Galeno T. Valyn Latchford [facility]  If 7PM-7AM, please contact night-coverage [email or web address] 05/16/2023, 2:08 PM

## 2023-05-16 NOTE — Plan of Care (Signed)
 Pt alert, has delayed responses and fragmented speech but nods appropriately. He does get confused at times but will follow commands with encouragement. Pt has copious secretions, oral care completed every 4h and as needed. Pt tends to bite on [place] when suctioned, needs a lot of coaching. Pt receiving tube feeds per cortrak to L nare. Turned q2h. Had multiple BM tonight, peri care and bath given. Will continue to monitor pt.  Problem: Clinical Measurements: Goal: Ability to maintain clinical measurements within normal limits will improve Outcome: Progressing Goal: Respiratory complications will improve Outcome: Progressing   Problem: Activity: Goal: Risk for activity intolerance will decrease Outcome: Progressing   Problem: Nutrition: Goal: Adequate nutrition will be maintained Outcome: Progressing   Problem: Coping: Goal: Level of anxiety will decrease Outcome: Progressing   Problem: Elimination: Goal: Will not experience complications related to bowel motility Outcome: Progressing   Problem: Skin Integrity: Goal: Risk for impaired skin integrity will decrease Outcome: Progressing   Problem: Metabolic: Goal: Ability to maintain appropriate glucose levels will improve Outcome: Progressing

## 2023-05-16 NOTE — Plan of Care (Signed)
  Problem: Education: Goal: Knowledge of General Education information will improve Description: Including pain rating scale, medication(s)/side effects and non-pharmacologic comfort measures Outcome: Not Progressing   Problem: Health Behavior/Discharge Planning: Goal: Ability to manage health-related needs will improve Outcome: Not Progressing   Problem: Clinical Measurements: Goal: Ability to maintain clinical measurements within normal limits will improve Outcome: Progressing Goal: Will remain free from infection Outcome: Progressing Goal: Diagnostic test results will improve Outcome: Progressing Goal: Respiratory complications will improve Outcome: Progressing Goal: Cardiovascular complication will be avoided Outcome: Progressing   Problem: Activity: Goal: Risk for activity intolerance will decrease Outcome: Not Progressing   Problem: Nutrition: Goal: Adequate nutrition will be maintained Outcome: Progressing   Problem: Coping: Goal: Level of anxiety will decrease Outcome: Progressing   Problem: Elimination: Goal: Will not experience complications related to bowel motility Outcome: Progressing Goal: Will not experience complications related to urinary retention Outcome: Progressing   Problem: Pain Managment: Goal: General experience of comfort will improve and/or be controlled Outcome: Progressing   Problem: Safety: Goal: Ability to remain free from injury will improve Outcome: Progressing   Problem: Skin Integrity: Goal: Risk for impaired skin integrity will decrease Outcome: Progressing   Problem: Education: Goal: Knowledge of the prescribed therapeutic regimen will improve Outcome: Not Progressing   Problem: Activity: Goal: Ability to implement measures to reduce episodes of fatigue will improve Outcome: Not Progressing   Problem: Bowel/Gastric: Goal: Will not experience complications related to bowel motility Outcome: Progressing   Problem:  Coping: Goal: Ability to identify and develop effective coping behavior will improve Outcome: Not Progressing   Problem: Nutritional: Goal: Maintenance of adequate nutrition will improve Outcome: Progressing   Problem: Education: Goal: Ability to describe self-care measures that may prevent or decrease complications (Diabetes Survival Skills Education) will improve Outcome: Not Progressing   Problem: Coping: Goal: Ability to adjust to condition or change in health will improve Outcome: Not Progressing   Problem: Fluid Volume: Goal: Ability to maintain a balanced intake and output will improve Outcome: Progressing   Problem: Health Behavior/Discharge Planning: Goal: Ability to identify and utilize available resources and services will improve Outcome: Not Progressing Goal: Ability to manage health-related needs will improve Outcome: Not Progressing   Problem: Metabolic: Goal: Ability to maintain appropriate glucose levels will improve Outcome: Progressing   Problem: Nutritional: Goal: Maintenance of adequate nutrition will improve Outcome: Progressing Goal: Progress toward achieving an optimal weight will improve Outcome: Progressing   Problem: Skin Integrity: Goal: Risk for impaired skin integrity will decrease Outcome: Progressing   Problem: Tissue Perfusion: Goal: Adequacy of tissue perfusion will improve Outcome: Progressing   Problem: Activity: Goal: Ability to tolerate increased activity will improve Outcome: Progressing   Problem: Respiratory: Goal: Ability to maintain a clear airway and adequate ventilation will improve Outcome: Progressing   Problem: Role Relationship: Goal: Method of communication will improve Outcome: Not Progressing

## 2023-05-16 NOTE — Progress Notes (Signed)
 Daily Progress Note   Patient Name: Tyler [name]       Date: 05/16/2023 DOB: [birth year]  Age: 73 y.o. MRN#: [id number] Attending Physician: Almon Hercules, MD Primary Care Physician: Nechama Guard, FNP Admit Date: 04/22/2023  Reason for Consultation/Follow-up: Establishing goals of care  Subjective: Medical records reviewed including progress notes, labs, and imaging. Patient assessed at the bedside.  Utilized Physicist, medical [phone].  Patient nods to confirm his name, however he does not answer any of the questions asked via interpreter.  He looks around the room when asked where he is. Discussed with RN.  No family present during visit.  Called patient's daughter Karena Addison to continue goals of care conversation.  We reviewed her initial conversation with my colleague, as well as the conversations she has had with her family and other specialists including hospitalist, neurologist.  She is very appreciative of the information from the neuro team in particular.  Her understanding is that patient's brain swelling will need more time to determine whether it is due to seizure versus stroke versus worsening lymphoma.  She understands that swelling from seizure/stroke may improve, though prognosis is very poor if it is due to worsening lymphoma.  We also discussed the role of delirium during this prolonged hospitalization.  She is grateful that patient seems to be improving since the evening of her initial conversation with my colleague, with more communication efforts from the patient.  She is hopeful for further improvements and would like to continue with aggressive treatments for now, holding off on consideration of comfort care.  Her family is in agreement.  We discussed CODE STATUS as well.  Karena Addison states that the family has "not finalized  DNR."  She would like to continue deliberating and sees herself making this decision based on the situation as it arises.  Questions and concerns addressed. PMT will continue to support holistically.   Length of Stay: 24   Physical Exam Vitals and nursing note reviewed.  Constitutional:      General: He is not in acute distress.    Appearance: He is ill-appearing.     Interventions: Nasal cannula in place.  Cardiovascular:     Rate and Rhythm: Normal rate.  Pulmonary:     Effort: Pulmonary effort is normal.     Comments: secretions Neurological:     Mental Status: He is alert. He is disoriented.  Psychiatric:        Cognition and Memory: Cognition is impaired.            Vital Signs: BP 119/80 (BP Location: Left Arm)   Pulse  84   Temp 98 F (36.7 C)   Resp 16   Ht 5' 7.01" (1.702 m)   Wt 84.9 kg   SpO2 100%   BMI 29.31 kg/m  SpO2: SpO2: 100 % O2 Device: O2 Device: Room Air O2 Flow Rate: O2 Flow Rate (L/min): 2 L/min      Palliative Assessment/Data: 30% (with tube feeds)   Palliative Care Assessment & Plan   Patient Profile: Per intake H&P --> 73 year old male Prostate cancer with prostatectomy 2021, bilateral PE after surgery at that time. Lumbosacral stenosis and lower back pain manage conservatively. Atrial fibrillation not on anticoagulation secondary to high bleeding risk. Chronic hepatitis B tenofovir. 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 Colonel Bald completed 6 cycles of R-CHOP with remission of the parenchymal lesion receiving high-dose methotrexate cycle 2 to start. Patient was readmitted on 2/25 for second cycle of methotrexate/leucovorin and was noted to have some left-sided weakness as well as partial sensory seizures and was on Keppra from the prior admission.   Palliative care has been asked to get  involved to support additional goals of care conversations.   Assessment: Goals of care conversation High-grade B-cell lymphoma with resection and recent chemo Focal seizure secondary to cerebral edema Acute ischemic stroke Dysphagia  Recommendations/Plan: Continue full code/full scope treatment Patient's family would like to allow him more time to improve given his recent progress.  Would consider comfort care if he declines Psychosocial and emotional support provided Ongoing goals of care discussions pending clinical course PMT will continue to follow and support   Prognosis:  Unable to determine  Discharge Planning: To Be Determined  Care plan was discussed with RN, patient, patient's daughter    MDM high         Talullah Abate Jeni Salles, PA-C  Palliative Medicine Team Team phone # [phone]  Thank you for allowing the Palliative Medicine Team to assist in the care of this patient. Please utilize secure chat with additional questions, if there is no response within 30 minutes please call the above phone number.  Palliative Medicine Team providers are available by phone from 7am to 7pm daily and can be reached through the team cell phone.  Should this patient require assistance outside of these hours, please call the patient's attending physician.

## 2023-05-17 DIAGNOSIS — C8339 Primary central nervous system lymphoma: Secondary | ICD-10-CM | POA: Diagnosis not present

## 2023-05-17 DIAGNOSIS — Z515 Encounter for palliative care: Secondary | ICD-10-CM

## 2023-05-17 DIAGNOSIS — R569 Unspecified convulsions: Secondary | ICD-10-CM | POA: Diagnosis not present

## 2023-05-17 DIAGNOSIS — C8338 Diffuse large B-cell lymphoma, lymph nodes of multiple sites: Secondary | ICD-10-CM

## 2023-05-17 DIAGNOSIS — Z7189 Other specified counseling: Secondary | ICD-10-CM | POA: Diagnosis not present

## 2023-05-17 LAB — GLUCOSE, CAPILLARY
Glucose-Capillary: 106 mg/dL — ABNORMAL HIGH (ref 70–99)
Glucose-Capillary: 122 mg/dL — ABNORMAL HIGH (ref 70–99)
Glucose-Capillary: 123 mg/dL — ABNORMAL HIGH (ref 70–99)
Glucose-Capillary: 129 mg/dL — ABNORMAL HIGH (ref 70–99)
Glucose-Capillary: 145 mg/dL — ABNORMAL HIGH (ref 70–99)
Glucose-Capillary: 147 mg/dL — ABNORMAL HIGH (ref 70–99)
Glucose-Capillary: 157 mg/dL — ABNORMAL HIGH (ref 70–99)

## 2023-05-17 MED ORDER — ASPIRIN 81 MG PO CHEW
81.0000 mg | CHEWABLE_TABLET | Freq: Every day | ORAL | Status: DC
  Administered 2023-05-17 – 2023-05-20 (×4): 81 mg
  Filled 2023-05-17 (×4): qty 1

## 2023-05-17 NOTE — Progress Notes (Signed)
 PROGRESS NOTE  Tyler [name] WUJ:[id number] DOB: 1950-12-07   PCP: Nechama Guard, FNP  Patient is from: Home.  DOA: 04/22/2023 LOS: 25  Chief complaints No chief complaint on file.    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  Patient developed neck swelling, drooling, difficulty swallowing and altered mental status with concern for partial seizure.  Pulmonology consulted on 2/28.  He was intubated and admitted to ICU.  Core track placed on 3/1.  MRI brain without contrast on 3/11 showed resolving diffusion abnormality within the posterior right MCA territory and increased extent of hyperintense T2 weighted signal in right hemisphere concerning for progression of lymphoma or confluence of postischemic/postictal edema and lymphoma related vasogenic edema.  Eventually extubated on 3/2 but continued to have focal seizure.  He was reintubated and finally extubated on 3/14 again.  He was transferred to hospitalist service on 3/17.  Oncology, neurology and palliative medicine following.  Subjective: Seen and examined earlier this morning.  No major events overnight of this morning.  He is awake and alert.   He is oriented to his name, place and month but with significant dysarthria.  Follows commands.  Objective: Vitals:   05/17/23 0500 05/17/23 0741 05/17/23 1235 05/17/23 1541  BP:  133/88 135/82 130/81  Pulse:  81 88 86  Resp:  16 18 17   Temp:  98.7 F (37.1 C) 99 F (37.2 C) 98.7 F (37.1 C)  TempSrc:  Oral Oral Oral  SpO2:  97% 92% 95%   Weight: 85.8 kg     Height:        Examination:  GENERAL: No apparent distress.  Nontoxic. HEENT: MMM.  Vision and hearing grossly intact.  NECK: Supple.  No apparent JVD.  RESP:  No IWOB.  Fair aeration bilaterally. CVS:  RRR. Heart sounds normal.  ABD/GI/GU: BS+. Abd soft, NTND.  MSK/EXT:  Moves right extremities.  Cannot move left extremities. No edema.  SKIN: no apparent skin lesion or wound NEURO: Awake and alert.  Oriented to self, place and month but with severe dysarthria.  Left hemiparesis but able to raise his left arm off the bed higher.  Not able to raise his left leg.  Left facial droop. PSYCH: Calm. Normal affect.   Consultants:  Hematology/oncology admitted patient Critical care Neurology Radiation oncology Palliative medicine  Procedures: Intubation and mechanical ventilation.  Microbiology summarized: 3/1-MRSA PCR screen nonreactive. 3/1-respiratory culture negative 3/1-a 20 pathogen RVP nonreactive 3/1-blood cultures NGTD 3/11-respiratory culture with abundant Pseudomonas aeruginosa 3/13-blood cultures NGTD   Assessment and plan: Acute ischemic stroke: MRI on 3/11 showed resolving diffusion abnormality within the posterior right MCA territory, and increased extent of hyperintense T2 weighted signal in the right hemisphere concerning for progression of lymphoma or postischemic edema.  CT angio head and neck without LVO or high-grade stenosis.  TTE on 3/1 without significant finding.  LDL 97.  A1c 5.2% on 2/12. -Appreciated by neurology-continue aspirin 81 mg daily -PT, OT, SLP and telemetry  Focal seizure secondary to cerebral edema: LTM EEG on 3/3 with clinical signs of seizures arising from right parietal region and evidence  of epileptogenicity and cortical dysfunction arising from the right hemisphere.  Repeat LTM EEG on 3/20 with no seizure but evidence of epileptogenicity and cortisol dysfunction arising from right hemisphere. -On Vimpat, Keppra and  gabapentin per neurology. -Continue Decadron -Seizure precaution -Neurology signed off.  High-grade B-cell lymphoma with resection and recent chemo-primarily admitted second cycle of methotrexate and leucovorin -Oncology recommending goal of care discussion. -No clear role of radiation per radiation oncology. -Continue Decadron and Keppra. -Palliative medicine on board.  Dysphagia: Due to the above.  NPO. -Has been on tube feed via cortrack for 3 weeks now.   -Discussed with patient's daughter on 3/21.  PEG tube next week if no improvement over the weekend.  Possible aspiration pneumonia: Respiratory culture with Pseudomonas. -Completed IV Zosyn from 3/13-3/18.  Paroxysmal atrial fibrillation: Rate controlled without meds.  Not on AC due to bleeding risk. -Optimize electrolytes   Previous PEs LLE DVT: No longer on AC.   Prostate cancer s/p prostatectomy   Hepatitis B -Continue tenofovir 25 daily   Class I obesity Body mass index is 29.62 kg/m. Nutrition Problem: Inadequate oral intake Etiology:  (seizures) Signs/Symptoms: NPO status Interventions: Tube feeding, Prostat   DVT prophylaxis:  SCDs Start: 04/25/23 2209 enoxaparin (LOVENOX) injection 40 mg Start: 04/22/23 2200  Code Status: Full code Family Communication: Updated patient's daughter over the phone on 3/21. Level of care: Progressive Status is: Inpatient Remains inpatient appropriate because: Seizure, diffuse B-cell lymphoma, acute CVA and dysphagia   Final disposition: To be determined   35 minutes with more than 50% spent in reviewing records, counseling patient/family and coordinating care.   Sch Meds:  Scheduled Meds:  aspirin  81 mg Per Tube Daily   Chlorhexidine Gluconate Cloth  6 each Topical Nightly   cholecalciferol  2,000 Units Per Tube Daily   vitamin B-12  1,000 mcg Per Tube Daily   dexamethasone  4 mg Per Tube Q12H   enoxaparin (LOVENOX) injection  40 mg Subcutaneous Q24H   feeding  supplement (PROSource TF20)  60 mL Per Tube BID   fiber supplement (BANATROL TF)  60 mL Per Tube TID   folic acid  1 mg Per Tube Daily   gabapentin  100 mg Per Tube Q8H   insulin aspart  0-15 Units Subcutaneous Q4H   lacosamide  200 mg Per Tube BID   levETIRAcetam  2,000 mg Per Tube BID   scopolamine  1 patch Transdermal Q72H   sodium chloride flush  10-40 mL Intracatheter Q12H   sodium chloride flush  3-10 mL Intravenous Q12H   tenofovir alafenamide  25 mg Per Tube Daily   thiamine (VITAMIN B1) injection  100 mg Intravenous Daily   Continuous Infusions:  feeding supplement (OSMOLITE 1.5 CAL) 55 mL/hr at 05/16/23 1624   PRN Meds:.acetaminophen, alum & mag hydroxide-simeth, hydrALAZINE, hydrocortisone, ibuprofen, lidocaine, LORazepam, magic mouthwash w/lidocaine, ondansetron (ZOFRAN) IV, mouth rinse, polyethylene glycol, senna-docusate, sodium chloride flush, sodium chloride flush  Antimicrobials: Anti-infectives (From admission, onward)    Start     Dose/Rate Route Frequency Ordered Stop   05/09/23 1000  tenofovir alafenamide (VEMLIDY) tablet 25 mg        25 mg Per Tube Daily 05/08/23 1135     05/08/23 1245  piperacillin-tazobactam (ZOSYN) IVPB 3.375 g        3.375 g 12.5 mL/hr over 240 Minutes Intravenous Every 8 hours 05/08/23 1153 05/13/23 0955   05/08/23 1000  tenofovir (VIREAD) tablet 300 mg  Status:  Discontinued  300 mg Per Tube Daily 05/07/23 1143 05/08/23 1135   05/07/23 2300  vancomycin (VANCOCIN) IVPB 1000 mg/200 mL premix  Status:  Discontinued        1,000 mg 200 mL/hr over 60 Minutes Intravenous Every 12 hours 05/07/23 1218 05/08/23 1401   05/07/23 1015  vancomycin (VANCOREADY) IVPB 1750 mg/350 mL        1,750 mg 175 mL/hr over 120 Minutes Intravenous  Once 05/07/23 0921 05/07/23 1236   05/07/23 1015  ceFEPIme (MAXIPIME) 2 g in sodium chloride 0.9 % 100 mL IVPB  Status:  Discontinued        2 g 200 mL/hr over 30 Minutes Intravenous Every 8 hours 05/07/23 0921  05/08/23 1153   04/28/23 1000  tenofovir alafenamide (VEMLIDY) tablet 25 mg  Status:  Discontinued        25 mg Per Tube Daily 04/27/23 2309 05/07/23 1143   04/23/23 1000  tenofovir alafenamide (VEMLIDY) tablet 25 mg  Status:  Discontinued        25 mg Oral Daily 04/22/23 1520 04/27/23 2309        I have personally reviewed the following labs and images: CBC: Recent Labs  Lab 05/11/23 0500 05/12/23 1017 05/15/23 0718 05/16/23 0521  WBC 7.4 7.7 10.0 7.9  NEUTROABS  --  5.1 7.3  --   HGB 11.8* 12.0* 11.4* 10.9*  HCT 35.3* 36.5* 34.3* 33.1*  MCV 90.5 90.3 91.2 89.9  PLT 267 284 237 228   BMP &GFR Recent Labs  Lab 05/11/23 0500 05/12/23 1017 05/15/23 0718 05/16/23 0521  NA 134* 136 135 134*  K 4.2 4.3 4.1 4.1  CL 98 98 101 101  CO2 29 27 27 28   GLUCOSE 129* 105* 125* 133*  BUN 22 21 23 21   CREATININE 0.73 0.71 0.73 0.68  CALCIUM 8.4* 8.6* 8.4* 8.0*  MG 2.1  --  1.9 1.8  PHOS 3.4  --  4.4 4.1   Estimated Creatinine Clearance: 88.6 mL/min (by C-G formula based on SCr of 0.68 mg/dL). Liver & Pancreas: Recent Labs  Lab 05/12/23 1017 05/15/23 0718 05/16/23 0521  AST 34 24  --   ALT 77* 59*  --   ALKPHOS 66 60  --   BILITOT 0.6 0.4  --   PROT 5.6* 5.2*  --   ALBUMIN 2.7* 2.4* 2.4*   No results for input(s): "LIPASE", "AMYLASE" in the last 168 hours. No results for input(s): "AMMONIA" in the last 168 hours. Diabetic: No results for input(s): "HGBA1C" in the last 72 hours. Recent Labs  Lab 05/17/23 0502 05/17/23 0511 05/17/23 0743 05/17/23 1236 05/17/23 1540  GLUCAP 145* 147* 157* 122* 129*   Cardiac Enzymes: No results for input(s): "CKTOTAL", "CKMB", "CKMBINDEX", "TROPONINI" in the last 168 hours. No results for input(s): "PROBNP" in the last 8760 hours. Coagulation Profile: No results for input(s): "INR", "PROTIME" in the last 168 hours. Thyroid Function Tests: No results for input(s): "TSH", "T4TOTAL", "FREET4", "T3FREE", "THYROIDAB" in the last 72  hours. Lipid Profile: Recent Labs    05/15/23 0718  CHOL 167  HDL 58  LDLCALC 97  TRIG 59  CHOLHDL 2.9   Anemia Panel: No results for input(s): "VITAMINB12", "FOLATE", "FERRITIN", "TIBC", "IRON", "RETICCTPCT" in the last 72 hours. Urine analysis:    Component Value Date/Time   COLORURINE YELLOW 05/08/2023 1632   APPEARANCEUR HAZY (A) 05/08/2023 1632   LABSPEC >1.046 (H) 05/08/2023 1632   PHURINE 6.0 05/08/2023 1632   GLUCOSEU NEGATIVE 05/08/2023 1632  HGBUR NEGATIVE 05/08/2023 1632   BILIRUBINUR NEGATIVE 05/08/2023 1632   KETONESUR NEGATIVE 05/08/2023 1632   PROTEINUR NEGATIVE 05/08/2023 1632   NITRITE NEGATIVE 05/08/2023 1632   LEUKOCYTESUR NEGATIVE 05/08/2023 1632   Sepsis Labs: Invalid input(s): "PROCALCITONIN", "LACTICIDVEN"  Microbiology: Recent Results (from the past 240 hours)  MRSA Next Gen by PCR, Nasal     Status: None   Collection Time: 05/08/23 11:20 AM   Specimen: Nasal Mucosa; Nasal Swab  Result Value Ref Range Status   MRSA by PCR Next Gen NOT DETECTED NOT DETECTED Final    Comment: (NOTE) The GeneXpert MRSA Assay (FDA approved for NASAL specimens only), is one component of a comprehensive MRSA colonization surveillance program. It is not intended to diagnose MRSA infection nor to guide or monitor treatment for MRSA infections. Test performance is not FDA approved in patients less than 70 years old. Performed at [facility], [address], [place], [place] [address]   Culture, blood (Routine X 2) w Reflex to ID Panel     Status: None   Collection Time: 05/08/23  7:40 PM   Specimen: BLOOD RIGHT ARM  Result Value Ref Range Status   Specimen Description BLOOD RIGHT ARM  Final   Special Requests   Final    BOTTLES DRAWN AEROBIC ONLY Blood Culture results may not be optimal due to an inadequate volume of blood received in culture bottles   Culture   Final    NO GROWTH 5 DAYS Performed at [facility], [address] [place],  [place] [address]    Report Status 05/13/2023 FINAL  Final  Culture, blood (Routine X 2) w Reflex to ID Panel     Status: None   Collection Time: 05/08/23  7:47 PM   Specimen: BLOOD RIGHT HAND  Result Value Ref Range Status   Specimen Description BLOOD RIGHT HAND  Final   Special Requests   Final    BOTTLES DRAWN AEROBIC ONLY Blood Culture results may not be optimal due to an inadequate volume of blood received in culture bottles   Culture   Final    NO GROWTH 5 DAYS Performed at [facility], [address], [place], [place] [address]    Report Status 05/13/2023 FINAL  Final    Radiology Studies: No results found.     Tyler [name] T. Bettie Capistran [facility]  If 7PM-7AM, please contact night-coverage [email or web address] 05/17/2023, 3:54 PM

## 2023-05-17 NOTE — Plan of Care (Signed)
 On tube feeding, tolerated well, no other significant changes happen as of this time.  Problem: Clinical Measurements: Goal: Will remain free from infection Outcome: Progressing   Problem: Clinical Measurements: Goal: Respiratory complications will improve Outcome: Progressing   Problem: Clinical Measurements: Goal: Cardiovascular complication will be avoided Outcome: Progressing   Problem: Activity: Goal: Risk for activity intolerance will decrease Outcome: Progressing   Problem: Nutrition: Goal: Adequate nutrition will be maintained Outcome: Progressing   Problem: Coping: Goal: Level of anxiety will decrease Outcome: Progressing   Problem: Elimination: Goal: Will not experience complications related to bowel motility Outcome: Progressing   Problem: Skin Integrity: Goal: Risk for impaired skin integrity will decrease Outcome: Progressing   Problem: Bowel/Gastric: Goal: Will not experience complications related to bowel motility Outcome: Progressing

## 2023-05-18 DIAGNOSIS — C8339 Primary central nervous system lymphoma: Secondary | ICD-10-CM | POA: Diagnosis not present

## 2023-05-18 DIAGNOSIS — Z515 Encounter for palliative care: Secondary | ICD-10-CM | POA: Diagnosis not present

## 2023-05-18 DIAGNOSIS — Z7189 Other specified counseling: Secondary | ICD-10-CM | POA: Diagnosis not present

## 2023-05-18 DIAGNOSIS — R569 Unspecified convulsions: Secondary | ICD-10-CM | POA: Diagnosis not present

## 2023-05-18 DIAGNOSIS — C8338 Diffuse large B-cell lymphoma, lymph nodes of multiple sites: Secondary | ICD-10-CM | POA: Diagnosis not present

## 2023-05-18 LAB — GLUCOSE, CAPILLARY
Glucose-Capillary: 106 mg/dL — ABNORMAL HIGH (ref 70–99)
Glucose-Capillary: 117 mg/dL — ABNORMAL HIGH (ref 70–99)
Glucose-Capillary: 129 mg/dL — ABNORMAL HIGH (ref 70–99)
Glucose-Capillary: 137 mg/dL — ABNORMAL HIGH (ref 70–99)
Glucose-Capillary: 161 mg/dL — ABNORMAL HIGH (ref 70–99)
Glucose-Capillary: 94 mg/dL (ref 70–99)

## 2023-05-18 MED ORDER — PANCRELIPASE (LIP-PROT-AMYL) 10440-39150 UNITS PO TABS
20880.0000 [IU] | ORAL_TABLET | Freq: Once | ORAL | Status: AC
  Administered 2023-05-18: 20880 [IU]
  Filled 2023-05-18: qty 2

## 2023-05-18 MED ORDER — SODIUM BICARBONATE 650 MG PO TABS
650.0000 mg | ORAL_TABLET | Freq: Once | ORAL | Status: AC
  Administered 2023-05-18: 650 mg
  Filled 2023-05-18: qty 1

## 2023-05-18 NOTE — Plan of Care (Signed)
 Pt's alert, oriented to self, follows command. On tube feeding, tolerated well. Had moderate secretions but agitated when trying to suction and clean his mouth. No other significant changes happen as of this time.    Problem: Education: Goal: Knowledge of General Education information will improve Description: Including pain rating scale, medication(s)/side effects and non-pharmacologic comfort measures Outcome: Progressing   Problem: Clinical Measurements: Goal: Will remain free from infection Outcome: Progressing   Problem: Clinical Measurements: Goal: Respiratory complications will improve Outcome: Progressing   Problem: Activity: Goal: Risk for activity intolerance will decrease Outcome: Progressing   Problem: Nutrition: Goal: Adequate nutrition will be maintained Outcome: Progressing   Problem: Coping: Goal: Level of anxiety will decrease Outcome: Progressing   Problem: Safety: Goal: Ability to remain free from injury will improve Outcome: Progressing   Problem: Skin Integrity: Goal: Risk for impaired skin integrity will decrease Outcome: Progressing

## 2023-05-18 NOTE — Progress Notes (Signed)
 Palliative Medicine Inpatient Follow Up Note HPI: 73 year old male Prostate cancer with prostatectomy 2021, bilateral PE after surgery at that time. Lumbosacral stenosis and lower back pain manage conservatively. Atrial fibrillation not on anticoagulation secondary to high bleeding risk. Chronic hepatitis B tenofovir. 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 Colonel Bald completed 6 cycles of R-CHOP with remission of the parenchymal lesion receiving high-dose methotrexate cycle 2 to start. Patient was readmitted on 2/25 for second cycle of methotrexate/leucovorin and was noted to have some left-sided weakness as well as partial sensory seizures and was on Keppra from the prior admission.   Palliative care has been asked to get involved to support additional goals of care conversations.   Today's Discussion 05/18/2023  *Please note that this is a verbal dictation therefore any spelling or grammatical errors are due to the "[employer] One" system interpretation.  Chart reviewed inclusive of vital signs, progress notes, laboratory results, and diagnostic images.   I met with Tyler [name] at bedside this morning. He is more alert and awake. He is able to shake his head though he does not nod. He does try to say his name when spoken to. He is able to track me well.   I called and spoke with patients daughter, Tyler [name]. I created space and opportunity for Tyler [name]  to explore thoughts feelings and fears regarding Tyler [name]'s current medical situation. Discussed his ongoing NGT feedings and the ide of long term G-tube feedings if the goals are geared towards more aggressive efforts.    Tyler [name] shares that she plans to speak to her family later today in regards to decisions related to G-tube as well as resuscitation status. She asks that I allow her time to speak  to them and requests that I follow up with her on Tuesday. In the meanwhile she plans to speak to Dr. Alanda Slim directly once additional decisions are made.   Questions and concerns addressed/Palliative Support Provided.   Objective Assessment: Vital Signs Vitals:   05/18/23 0442 05/18/23 0818  BP: 131/84 134/78  Pulse: 77   Resp: 18 16  Temp: 98.9 F (37.2 C) 98 F (36.7 C)  SpO2: 96%     Intake/Output Summary (Last 24 hours) at 05/18/2023 1009 Last data filed at 05/18/2023 0048 Gross per 24 hour  Intake --  Output 1550 ml  Net -1550 ml   Last Weight  Most recent update: 05/18/2023  5:24 AM    Weight  84.5 kg (186 lb 4.6 oz)            Gen: Elderly African-American male chronically ill-appearing HEENT: Core track, moist mucous membranes CV: Regular rate and rhythm  PULM: On RA, (+) secretions, breathing is unlabored ABD: soft/nontender  EXT: Generalized edema  Neuro: Awake, tries to speak, able to track  SUMMARY OF RECOMMENDATIONS   Full Code --> Patient's daughters discussing this further with family   Patients daughter speaking with family today regarding the idea of a G-Tube   Ongoing palliative care support --> Per patients daughter she would prefer if we FU on Tuesday  Billing based on MDM: High - complex medical decision making/escalation of care ______________________________________________________________________________________ Lamarr Lulas [facility] Palliative Medicine Team Team Cell Phone: [phone] Please utilize secure chat with additional questions, if there is no response within 30 minutes please call the above phone number  Palliative Medicine Team providers are available by phone from 7am  to 7pm daily and can be reached through the team cell phone.  Should this patient require assistance outside of these hours, please call the patient's attending physician.

## 2023-05-18 NOTE — Progress Notes (Signed)
 PROGRESS NOTE  Tyler [name] EAV:[id number] DOB: 02-Jan-1951   PCP: Nechama Guard, FNP  Patient is from: Home.  DOA: 04/22/2023 LOS: 26  Chief complaints No chief complaint on file.    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  Patient developed neck swelling, drooling, difficulty swallowing and altered mental status with concern for partial seizure.  Pulmonology consulted on 2/28.  He was intubated and admitted to ICU.  Core track placed on 3/1.  MRI brain without contrast on 3/11 showed resolving diffusion abnormality within the posterior right MCA territory and increased extent of hyperintense T2 weighted signal in right hemisphere concerning for progression of lymphoma or confluence of postischemic/postictal edema and lymphoma related vasogenic edema.  Eventually extubated on 3/2 but continued to have focal seizure.  He was reintubated and finally extubated on 3/14 again.  He was transferred to hospitalist service on 3/17.  Oncology and neurology involved but signed off.  Palliative medicine following.  IR consulted for G-tube.  Subjective: Seen and examined earlier this morning.  No major events overnight of this morning.  He is awake and alert.  No complaints but limited history due to significant dysarthria/aphasia.  He is oriented to self, place and months.  Follows commands.  Objective: Vitals:   05/18/23 0442 05/18/23 0500 05/18/23 0818 05/18/23 1107  BP: 131/84  134/78 (!) 148/86  Pulse: 77   86  Resp: 18  16   Temp: 98.9 F (37.2 C)   98 F (36.7 C) 98.1 F (36.7 C)  TempSrc: Oral   Oral  SpO2: 96%   98%  Weight:  84.5 kg    Height:        Examination:  GENERAL: No apparent distress.  Nontoxic. HEENT: MMM.  Vision and hearing grossly intact.  NECK: Supple.  No apparent JVD.  RESP:  No IWOB.  Fair aeration bilaterally. CVS:  RRR. Heart sounds normal.  ABD/GI/GU: BS+. Abd soft, NTND.  MSK/EXT:  Moves right extremities.  Cannot move left extremities. No edema.  SKIN: no apparent skin lesion or wound NEURO: Awake and alert.  Oriented to self, place and month but with severe dysarthria.  Left hemiparesis but able to raise his left arm off the bed higher.  Not able to raise his left leg.  Left facial droop. PSYCH: Calm. Normal affect.   Consultants:  Hematology/oncology admitted patient Critical care Neurology Radiation oncology Palliative medicine  Procedures: Intubation and mechanical ventilation.  Microbiology summarized: 3/1-MRSA PCR screen nonreactive. 3/1-respiratory culture negative 3/1-a 20 pathogen RVP nonreactive 3/1-blood cultures NGTD 3/11-respiratory culture with abundant Pseudomonas aeruginosa 3/13-blood cultures NGTD   Assessment and plan: Acute ischemic stroke: MRI on 3/11 showed resolving diffusion abnormality within the posterior right MCA territory, and increased extent of hyperintense T2 weighted signal in the right hemisphere concerning for progression of lymphoma or postischemic edema.  CT angio head and neck without LVO or high-grade stenosis.  TTE on 3/1 without significant finding.  LDL 97.  A1c 5.2% on 2/12. -Appreciated by neurology-continue aspirin 81 mg daily -PT, OT, SLP and telemetry  Focal seizure secondary to cerebral edema: LTM  EEG on 3/3 with clinical signs of seizures arising from right parietal region and evidence of epileptogenicity and cortical dysfunction arising from the right hemisphere.  Repeat LTM EEG on 3/20 with no seizure but evidence of epileptogenicity and  cortisol dysfunction arising from right hemisphere. -On Vimpat, Keppra and gabapentin per neurology. -Continue Decadron -Seizure precaution -Neurology signed off.  High-grade B-cell lymphoma with resection and recent chemo-primarily admitted second cycle of methotrexate and leucovorin -Oncology recommending goal of care discussion. -No clear role of radiation per radiation oncology. -Continue Decadron and Keppra. -Palliative medicine on board.  Dysphagia: Due to the above.  NPO. -Has been on tube feed via cortrack for 3 weeks now.   -Family wants to proceed with G-tube.  IR consulted.  Possible aspiration pneumonia: Respiratory culture with Pseudomonas. -Completed IV Zosyn from 3/13-3/18.  Paroxysmal atrial fibrillation: Rate controlled without meds.  Not on AC due to bleeding risk. -Optimize electrolytes   Previous PEs LLE DVT: No longer on AC.   Prostate cancer s/p prostatectomy   Hepatitis B -Continue tenofovir 25 daily   Class I obesity Body mass index is 29.17 kg/m. Nutrition Problem: Inadequate oral intake Etiology:  (seizures) Signs/Symptoms: NPO status Interventions: Tube feeding, Prostat   DVT prophylaxis:  SCDs Start: 04/25/23 2209 enoxaparin (LOVENOX) injection 40 mg Start: 04/22/23 2200  Code Status: Full code Family Communication: Updated patient's daughter, Karena Addison over the phone Level of care: Progressive Status is: Inpatient Remains inpatient appropriate because: Seizure, diffuse B-cell lymphoma, acute CVA and dysphagia   Final disposition: To be determined   35 minutes with more than 50% spent in reviewing records, counseling patient/family and coordinating care.   Sch Meds:  Scheduled Meds:  aspirin  81 mg Per Tube Daily   Chlorhexidine Gluconate Cloth  6 each Topical Nightly   cholecalciferol  2,000 Units Per Tube Daily   vitamin B-12  1,000 mcg Per Tube Daily   dexamethasone  4 mg Per Tube Q12H   enoxaparin (LOVENOX) injection  40 mg  Subcutaneous Q24H   feeding supplement (PROSource TF20)  60 mL Per Tube BID   fiber supplement (BANATROL TF)  60 mL Per Tube TID   folic acid  1 mg Per Tube Daily   gabapentin  100 mg Per Tube Q8H   insulin aspart  0-15 Units Subcutaneous Q4H   lacosamide  200 mg Per Tube BID   levETIRAcetam  2,000 mg Per Tube BID   scopolamine  1 patch Transdermal Q72H   sodium chloride flush  10-40 mL Intracatheter Q12H   sodium chloride flush  3-10 mL Intravenous Q12H   tenofovir alafenamide  25 mg Per Tube Daily   thiamine (VITAMIN B1) injection  100 mg Intravenous Daily   Continuous Infusions:  feeding supplement (OSMOLITE 1.5 CAL) 1,000 mL (05/17/23 2005)   PRN Meds:.acetaminophen, alum & mag hydroxide-simeth, hydrALAZINE, hydrocortisone, ibuprofen, lidocaine, LORazepam, magic mouthwash w/lidocaine, ondansetron (ZOFRAN) IV, mouth rinse, polyethylene glycol, senna-docusate, sodium chloride flush, sodium chloride flush  Antimicrobials: Anti-infectives (From admission, onward)    Start     Dose/Rate Route Frequency Ordered Stop   05/09/23 1000  tenofovir alafenamide (VEMLIDY) tablet 25 mg        25 mg Per Tube Daily 05/08/23 1135     05/08/23 1245  piperacillin-tazobactam (ZOSYN) IVPB 3.375 g        3.375 g 12.5 mL/hr over 240 Minutes Intravenous Every 8 hours 05/08/23 1153 05/13/23 0955   05/08/23 1000  tenofovir (VIREAD) tablet 300 mg  Status:  Discontinued        300 mg Per Tube Daily 05/07/23 1143 05/08/23 1135   05/07/23 2300  vancomycin (VANCOCIN) IVPB 1000 mg/200 mL premix  Status:  Discontinued        1,000 mg 200 mL/hr over 60 Minutes Intravenous Every 12 hours 05/07/23 1218 05/08/23 1401   05/07/23 1015  vancomycin (VANCOREADY) IVPB 1750 mg/350 mL        1,750 mg 175 mL/hr over 120 Minutes Intravenous  Once 05/07/23 0921 05/07/23 1236   05/07/23 1015  ceFEPIme (MAXIPIME) 2 g in sodium chloride 0.9 % 100 mL IVPB  Status:  Discontinued        2 g 200 mL/hr over 30 Minutes Intravenous  Every 8 hours 05/07/23 0921 05/08/23 1153   04/28/23 1000  tenofovir alafenamide (VEMLIDY) tablet 25 mg  Status:  Discontinued        25 mg Per Tube Daily 04/27/23 2309 05/07/23 1143   04/23/23 1000  tenofovir alafenamide (VEMLIDY) tablet 25 mg  Status:  Discontinued        25 mg Oral Daily 04/22/23 1520 04/27/23 2309        I have personally reviewed the following labs and images: CBC: Recent Labs  Lab 05/12/23 1017 05/15/23 0718 05/16/23 0521  WBC 7.7 10.0 7.9  NEUTROABS 5.1 7.3  --   HGB 12.0* 11.4* 10.9*  HCT 36.5* 34.3* 33.1*  MCV 90.3 91.2 89.9  PLT 284 237 228   BMP &GFR Recent Labs  Lab 05/12/23 1017 05/15/23 0718 05/16/23 0521  NA 136 135 134*  K 4.3 4.1 4.1  CL 98 101 101  CO2 27 27 28   GLUCOSE 105* 125* 133*  BUN 21 23 21   CREATININE 0.71 0.73 0.68  CALCIUM 8.6* 8.4* 8.0*  MG  --  1.9 1.8  PHOS  --  4.4 4.1   Estimated Creatinine Clearance: 88 mL/min (by C-G formula based on SCr of 0.68 mg/dL). Liver & Pancreas: Recent Labs  Lab 05/12/23 1017 05/15/23 0718 05/16/23 0521  AST 34 24  --   ALT 77* 59*  --   ALKPHOS 66 60  --   BILITOT 0.6 0.4  --   PROT 5.6* 5.2*  --   ALBUMIN 2.7* 2.4* 2.4*   No results for input(s): "LIPASE", "AMYLASE" in the last 168 hours. No results for input(s): "AMMONIA" in the last 168 hours. Diabetic: No results for input(s): "HGBA1C" in the last 72 hours. Recent Labs  Lab 05/17/23 2012 05/18/23 0014 05/18/23 0330 05/18/23 0817 05/18/23 1146  GLUCAP 123* 129* 106* 94 117*   Cardiac Enzymes: No results for input(s): "CKTOTAL", "CKMB", "CKMBINDEX", "TROPONINI" in the last 168 hours. No results for input(s): "PROBNP" in the last 8760 hours. Coagulation Profile: No results for input(s): "INR", "PROTIME" in the last 168 hours. Thyroid Function Tests: No results for input(s): "TSH", "T4TOTAL", "FREET4", "T3FREE", "THYROIDAB" in the last 72 hours. Lipid Profile: No results for input(s): "CHOL", "HDL", "LDLCALC",  "TRIG", "CHOLHDL", "LDLDIRECT" in the last 72 hours.  Anemia Panel: No results for input(s): "VITAMINB12", "FOLATE", "FERRITIN", "TIBC", "IRON", "RETICCTPCT" in the last 72 hours. Urine analysis:    Component Value Date/Time   COLORURINE YELLOW 05/08/2023 1632   APPEARANCEUR HAZY (A) 05/08/2023 1632   LABSPEC >1.046 (H) 05/08/2023 1632   PHURINE 6.0 05/08/2023 1632   GLUCOSEU NEGATIVE 05/08/2023 1632   HGBUR NEGATIVE 05/08/2023 1632   BILIRUBINUR NEGATIVE 05/08/2023 1632   KETONESUR NEGATIVE 05/08/2023 1632   PROTEINUR NEGATIVE 05/08/2023 1632  NITRITE NEGATIVE 05/08/2023 1632   LEUKOCYTESUR NEGATIVE 05/08/2023 1632   Sepsis Labs: Invalid input(s): "PROCALCITONIN", "LACTICIDVEN"  Microbiology: Recent Results (from the past 240 hours)  Culture, blood (Routine X 2) w Reflex to ID Panel     Status: None   Collection Time: 05/08/23  7:40 PM   Specimen: BLOOD RIGHT ARM  Result Value Ref Range Status   Specimen Description BLOOD RIGHT ARM  Final   Special Requests   Final    BOTTLES DRAWN AEROBIC ONLY Blood Culture results may not be optimal due to an inadequate volume of blood received in culture bottles   Culture   Final    NO GROWTH 5 DAYS Performed at [facility], [address], [place], [place] [address]    Report Status 05/13/2023 FINAL  Final  Culture, blood (Routine X 2) w Reflex to ID Panel     Status: None   Collection Time: 05/08/23  7:47 PM   Specimen: BLOOD RIGHT HAND  Result Value Ref Range Status   Specimen Description BLOOD RIGHT HAND  Final   Special Requests   Final    BOTTLES DRAWN AEROBIC ONLY Blood Culture results may not be optimal due to an inadequate volume of blood received in culture bottles   Culture   Final    NO GROWTH 5 DAYS Performed at [facility], [address] [place], [place] [address]    Report Status 05/13/2023 FINAL  Final    Radiology Studies: No results found.     Taelynn Mcelhannon T. Harmoni Lucus [facility]  If  7PM-7AM, please contact night-coverage [email or web address] 05/18/2023, 3:04 PM

## 2023-05-19 ENCOUNTER — Encounter (HOSPITAL_COMMUNITY): Payer: Self-pay | Admitting: Hematology

## 2023-05-19 ENCOUNTER — Inpatient Hospital Stay (HOSPITAL_COMMUNITY)

## 2023-05-19 DIAGNOSIS — Z515 Encounter for palliative care: Secondary | ICD-10-CM | POA: Diagnosis not present

## 2023-05-19 DIAGNOSIS — C8339 Primary central nervous system lymphoma: Secondary | ICD-10-CM | POA: Diagnosis not present

## 2023-05-19 DIAGNOSIS — C8338 Diffuse large B-cell lymphoma, lymph nodes of multiple sites: Secondary | ICD-10-CM | POA: Diagnosis not present

## 2023-05-19 DIAGNOSIS — R569 Unspecified convulsions: Secondary | ICD-10-CM | POA: Diagnosis not present

## 2023-05-19 DIAGNOSIS — Z7189 Other specified counseling: Secondary | ICD-10-CM | POA: Diagnosis not present

## 2023-05-19 LAB — GLUCOSE, CAPILLARY
Glucose-Capillary: 123 mg/dL — ABNORMAL HIGH (ref 70–99)
Glucose-Capillary: 127 mg/dL — ABNORMAL HIGH (ref 70–99)
Glucose-Capillary: 131 mg/dL — ABNORMAL HIGH (ref 70–99)
Glucose-Capillary: 151 mg/dL — ABNORMAL HIGH (ref 70–99)
Glucose-Capillary: 94 mg/dL (ref 70–99)
Glucose-Capillary: 95 mg/dL (ref 70–99)

## 2023-05-19 MED ORDER — METOCLOPRAMIDE HCL 5 MG/ML IJ SOLN
5.0000 mg | Freq: Two times a day (BID) | INTRAMUSCULAR | Status: DC
  Administered 2023-05-19 – 2023-05-22 (×8): 5 mg via INTRAVENOUS
  Filled 2023-05-19 (×8): qty 2

## 2023-05-19 MED ORDER — GLYCOPYRROLATE 1 MG PO TABS
0.5000 mg | ORAL_TABLET | Freq: Two times a day (BID) | ORAL | Status: DC
  Administered 2023-05-19 – 2023-05-20 (×4): 0.5 mg
  Filled 2023-05-19 (×4): qty 1

## 2023-05-19 NOTE — Progress Notes (Signed)
 Nutrition Follow-up  DOCUMENTATION CODES:   Not applicable  INTERVENTION:   Tube feeding via Cortrak tube: Osmolite 1.5 @ 55 ml/hr (1320 ml per day) Prosource TF20 60 ml BID Provides 2140 kcal, 122 gm protein, 1003 ml free water daily Banatrol TID to provide 5g of soluble fiber per packet  NUTRITION DIAGNOSIS:   Inadequate oral intake related to  (seizures) as evidenced by NPO status.  Ongoing  GOAL:   Patient will meet greater than or equal to 90% of their needs  Progressing   MONITOR:   TF tolerance, I & O's  REASON FOR ASSESSMENT:   Consult Enteral/tube feeding initiation and management (trickles)  ASSESSMENT:   73 y.o. male with PMH prostate cancer s/p prostatectomy in 2021, pulmonary embolism, hepatitis B exposure, and high-grade large B-cell lymphoma and completed 6 cycles of R-CHOP (chemo) with systemic remission in the past. 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  2/25 - admitted to [facility] 2/28 - intubated 3/1 - s/p cortrak placement (at [facility]); tip gastric per xray 3/2 - transferred to [facility], extubated 3/8 - re-intubated 3/14 - extubated 3/17 - BSE, NPO 3/19- s/p BSE- NPO, per radiation oncology- no role for radiation at this time 3/20- s/p BSE_ NPO  Reviewed I/O's: -1.3 L x 24 hours and +1.8 L since 05/05/23  UOP: 1.3 L x 24 hours   Pt unavailable at time of visit. Attempted to speak with pt via call to hospital room phone, however, unable to reach.   Per neurology notes, no seizures noted from 3/19-3/20/25.  Reviewed wt hx; pt has experienced a 4.1% wt loss over the past week, however, pt is close to admission wt. RD will continue to follow weight trends.   Case discussed with RN, who reports that pt was initially tolerating well, however, suctioned TF out of his mouth later in the shift and TF is now on hold. Per MD and palliative care,  plan for g-tube placement.   Per [facility] admissions team, pt is not a candidate for program   Palliative care- g-tube- f/u tuesday  Medications reviewed and include vitamin D3, vitamin B-12l, decadron, lovenox, folic acid, vimpat, keppra, decadron, and thiamine.   Labs reviewed: Na: 134, CBGS: 94-161 (inpatient orders for glycemic control are 0-15 units insulin aspart every 4 hours).    Diet Order:   Diet Order             Diet NPO time specified  Diet effective midnight           Diet NPO time specified  Diet effective now                   EDUCATION NEEDS:   Not appropriate for education at this time  Skin:  Skin Assessment: Reviewed RN Assessment  Last BM:  05/18/23 (type 6)  Height:   Ht Readings from Last 1 Encounters:  05/08/23 5' 7.01" (1.702 m)    Weight:   Wt Readings from Last 1 Encounters:  05/19/23 83.6 kg    Ideal Body Weight:  67.27 kg  BMI:  Body mass index is 28.86 kg/m.  Estimated Nutritional Needs:   Kcal:  2100-2350 kcals  Protein:  115-125g  Fluid:  >/= 2.1L    Levada Schilling, RD, LDN, CDCES Registered Dietitian III Certified Diabetes Care and Education Specialist If unable to reach this RD, please use "RD Inpatient" group chat on  secure chat between hours of 8am-4 pm daily

## 2023-05-19 NOTE — Progress Notes (Signed)
   Palliative Medicine Inpatient Follow Up Note  HPI: 73 year old male Prostate cancer with prostatectomy 2021, bilateral PE after surgery at that time. Lumbosacral stenosis and lower back pain manage conservatively. Atrial fibrillation not on anticoagulation secondary to high bleeding risk. Chronic hepatitis B tenofovir. 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 Colonel Bald completed 6 cycles of R-CHOP with remission of the parenchymal lesion receiving high-dose methotrexate cycle 2 to start. Patient was readmitted on 2/25 for second cycle of methotrexate/leucovorin and was noted to have some left-sided weakness as well as partial sensory seizures and was on Keppra from the prior admission.   Palliative care has been asked to get involved to support additional goals of care conversations.   Today's Discussion 05/19/2023  *Please note that this is a verbal dictation therefore any spelling or grammatical errors are due to the "[employer]agon [employer] One" system interpretation.  Chart reviewed inclusive of vital signs, progress notes, laboratory results, and diagnostic images.   I spoke to Dr. Alanda Slim this morning who shares that patients family has elected to proceed with PEG placement.   I met with Koltan at bedside this morning. He is resting calmly in NAD.  Per his RN, Debarah Crape this afternoon Eris has begun to show increasing intolerance to artificial feeding and has had tube feeding coming out of his mouth. The TF has been placed on hold at this time and G-Tub procedure is also delayed.   Questions and concerns addressed/Palliative Support Provided.   Objective Assessment: Vital Signs Vitals:   05/18/23 2358 05/19/23 0436  BP: (!) 145/91 (!) 131/95  Pulse: (!) 102 99  Resp:    Temp: 98.7 F (37.1 C) 99.3 F (37.4 C)  SpO2: 96% 95%     Intake/Output Summary (Last 24 hours) at 05/19/2023 1113 Last data filed at 05/19/2023 3086 Gross per 24 hour  Intake 123 ml  Output 1600 ml  Net -1477 ml   Last Weight  Most recent update: 05/19/2023  5:21 AM    Weight  83.6 kg (184 lb 4.9 oz)            Gen: Elderly African-American male chronically ill-appearing HEENT: Core track, moist mucous membranes CV: Regular rate and rhythm  PULM: On 2LPM , (+) secretions, breathing is unlabored ABD: soft/nontender  EXT: Generalized edema  Neuro: Awake, tries to speak, able to track  SUMMARY OF RECOMMENDATIONS   Full Code --> Patient's daughters discussing this further with family   G-Tube is going to be placed per primary team   Ongoing palliative care support --> Per patients daughter she would prefer if we FU on Tuesday  Time: 27 ______________________________________________________________________________________ Lamarr Lulas [facility] Palliative Medicine Team Team Cell Phone: [phone] Please utilize secure chat with additional questions, if there is no response within 30 minutes please call the above phone number  Palliative Medicine Team providers are available by phone from 7am to 7pm daily and can be reached through the team cell phone.  Should this patient require assistance outside of these hours, please call the patient's attending physician.

## 2023-05-19 NOTE — Progress Notes (Signed)
 PROGRESS NOTE  Tyler [name] RUE:[id number] DOB: [birth year]   PCP: Nechama Guard, FNP  Patient is from: Home.  DOA: 04/22/2023 LOS: 27  Chief complaints No chief complaint on file.    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  Patient developed neck swelling, drooling, difficulty swallowing and altered mental status with concern for partial seizure.  Pulmonology consulted on 2/28.  He was intubated and admitted to ICU.  Core track placed on 3/1.  MRI brain without contrast on 3/11 showed resolving diffusion abnormality within the posterior right MCA territory and increased extent of hyperintense T2 weighted signal in right hemisphere concerning for progression of lymphoma or confluence of postischemic/postictal edema and lymphoma related vasogenic edema.  Eventually extubated on 3/2 but continued to have focal seizure.  He was reintubated and finally extubated on 3/14 again.  He was transferred to hospitalist service on 3/17.  Oncology and neurology involved but signed off.  Palliative medicine following.  IR consulted for G-tube.  Subjective: Seen and examined earlier this morning.  No major events overnight of this morning.  Looks uncomfortable and anxious this morning.  Unfortunately, not able to vocalize.  Responds yes almost every questions.  He follows commands.  He has significant oral drainage not allowing suctioning.  Objective: Vitals:   05/18/23 2049 05/18/23 2358 05/19/23 0436 05/19/23 0500  BP: (!) 137/94 (!) 145/91 (!) 131/95    Pulse: 91 (!) 102 99   Resp: 20     Temp: 99.7 F (37.6 C) 98.7 F (37.1 C) 99.3 F (37.4 C)   TempSrc: Axillary Tympanic Axillary   SpO2: 95% 96% 95%   Weight:    83.6 kg  Height:        Examination:  GENERAL: No apparent distress.  Nontoxic. HEENT: MMM.  Vision and hearing grossly intact.  NECK: Supple.  No apparent JVD.  RESP:  No IWOB.  Fair aeration bilaterally. CVS:  RRR. Heart sounds normal.  ABD/GI/GU: BS+. Abd soft, NTND.  MSK/EXT:  Moves right extremities.  Cannot move left extremities. No edema.  SKIN: no apparent skin lesion or wound NEURO: Awake and alert.  Very dysarthric and aphasic.   Left hemiparesis.  Not able to move his left arm or left leg.  Left facial droop. Drooling.  PSYCH: Somewhat anxious  Consultants:  Hematology/oncology admitted patient Critical care Neurology Radiation oncology Palliative medicine  Procedures: Intubation and mechanical ventilation.  Microbiology summarized: 3/1-MRSA PCR screen nonreactive. 3/1-respiratory culture negative 3/1-a 20 pathogen RVP nonreactive 3/1-blood cultures NGTD 3/11-respiratory culture with abundant Pseudomonas aeruginosa 3/13-blood cultures NGTD   Assessment and plan: Acute ischemic stroke: MRI on 3/11 showed resolving diffusion abnormality within the posterior right MCA territory, and increased extent of hyperintense T2 weighted signal in the right hemisphere concerning for progression of lymphoma or postischemic edema.  CT angio head and neck without LVO or high-grade stenosis.  TTE on 3/1 without significant finding.  LDL 97.  A1c 5.2% on 2/12. -Appreciated by neurology-continue aspirin 81 mg daily -PT, OT, SLP and telemetry  Focal seizure secondary to cerebral edema: LTM EEG on 3/3 with clinical signs  of seizures arising from right parietal region and evidence of epileptogenicity and cortical dysfunction arising from the right hemisphere.  Repeat LTM EEG on 3/20 with no seizure but evidence of  epileptogenicity and cortisol dysfunction arising from right hemisphere. -On Vimpat, Keppra and gabapentin per neurology. -Continue Decadron -Seizure precaution -Neurology signed off.  High-grade B-cell lymphoma with resection and recent chemo-primarily admitted second cycle of methotrexate and leucovorin -Oncology recommending goal of care discussion. -No clear role of radiation per radiation oncology. -Continue Decadron and Keppra. -Palliative medicine on board.  Dysphagia: Due to the above.  NPO.  Excessive oral secretion and not allowing suctioning. -Continue tube feed via cortrack for almost 4 weeks now -Stop scopolamine patch.  Start glycopyrrolate 0.5 mg twice daily -Family wants to proceed with G-tube.  IR consulted.  Possible aspiration pneumonia: Respiratory culture with Pseudomonas. -Completed IV Zosyn from 3/13-3/18.  Remains high risk.  Paroxysmal atrial fibrillation: Rate controlled without meds.  Not on AC due to bleeding risk. -Optimize electrolytes   Previous PEs LLE DVT: No longer on AC.  Delirium? Looks anxious and uncomfortable today.  Has had fluctuating mental and neurostatus.  KUB reassuring.  Bladder scan about 380. -Delirium precaution -Continue monitoring   Prostate cancer s/p prostatectomy   Hepatitis B -Continue tenofovir 25 daily   Class I obesity Body mass index is 28.86 kg/m. Nutrition Problem: Inadequate oral intake Etiology:  (seizures) Signs/Symptoms: NPO status Interventions: Tube feeding, Prostat   DVT prophylaxis:  SCDs Start: 04/25/23 2209 enoxaparin (LOVENOX) injection 40 mg Start: 04/22/23 2200  Code Status: Full code Family Communication: None at bedside today Level of care: Progressive Status is: Inpatient Remains inpatient appropriate because: Seizure, diffuse B-cell lymphoma, acute CVA and dysphagia   Final disposition: To be determined   35 minutes with more than 50% spent in reviewing records, counseling  patient/family and coordinating care.   Sch Meds:  Scheduled Meds:  aspirin  81 mg Per Tube Daily   Chlorhexidine Gluconate Cloth  6 each Topical Nightly   cholecalciferol  2,000 Units Per Tube Daily   vitamin B-12  1,000 mcg Per Tube Daily   dexamethasone  4 mg Per Tube Q12H   enoxaparin (LOVENOX) injection  40 mg Subcutaneous Q24H   feeding supplement (PROSource TF20)  60 mL Per Tube BID   fiber supplement (BANATROL TF)  60 mL Per Tube TID   folic acid  1 mg Per Tube Daily   gabapentin  100 mg Per Tube Q8H   insulin aspart  0-15 Units Subcutaneous Q4H   lacosamide  200 mg Per Tube BID   levETIRAcetam  2,000 mg Per Tube BID   scopolamine  1 patch Transdermal Q72H   sodium chloride flush  10-40 mL Intracatheter Q12H   sodium chloride flush  3-10 mL Intravenous Q12H   tenofovir alafenamide  25 mg Per Tube Daily   thiamine (VITAMIN B1) injection  100 mg Intravenous Daily   Continuous Infusions:  feeding supplement (OSMOLITE 1.5 CAL) Stopped (05/19/23 1054)   PRN Meds:.acetaminophen, alum & mag hydroxide-simeth, hydrALAZINE, hydrocortisone, ibuprofen, lidocaine, LORazepam, magic mouthwash w/lidocaine, ondansetron (ZOFRAN) IV, mouth rinse, polyethylene glycol, senna-docusate, sodium chloride flush, sodium chloride flush  Antimicrobials: Anti-infectives (From admission, onward)    Start     Dose/Rate Route Frequency Ordered Stop   05/09/23 1000  tenofovir alafenamide (VEMLIDY) tablet 25 mg        25 mg Per Tube Daily 05/08/23 1135     05/08/23 1245  piperacillin-tazobactam (ZOSYN) IVPB 3.375  g        3.375 g 12.5 mL/hr over 240 Minutes Intravenous Every 8 hours 05/08/23 1153 05/13/23 0955   05/08/23 1000  tenofovir (VIREAD) tablet 300 mg  Status:  Discontinued        300 mg Per Tube Daily 05/07/23 1143 05/08/23 1135   05/07/23 2300  vancomycin (VANCOCIN) IVPB 1000 mg/200 mL premix  Status:  Discontinued        1,000 mg 200 mL/hr over 60 Minutes Intravenous Every 12 hours 05/07/23  1218 05/08/23 1401   05/07/23 1015  vancomycin (VANCOREADY) IVPB 1750 mg/350 mL        1,750 mg 175 mL/hr over 120 Minutes Intravenous  Once 05/07/23 0921 05/07/23 1236   05/07/23 1015  ceFEPIme (MAXIPIME) 2 g in sodium chloride 0.9 % 100 mL IVPB  Status:  Discontinued        2 g 200 mL/hr over 30 Minutes Intravenous Every 8 hours 05/07/23 0921 05/08/23 1153   04/28/23 1000  tenofovir alafenamide (VEMLIDY) tablet 25 mg  Status:  Discontinued        25 mg Per Tube Daily 04/27/23 2309 05/07/23 1143   04/23/23 1000  tenofovir alafenamide (VEMLIDY) tablet 25 mg  Status:  Discontinued        25 mg Oral Daily 04/22/23 1520 04/27/23 2309        I have personally reviewed the following labs and images: CBC: Recent Labs  Lab 05/15/23 0718 05/16/23 0521  WBC 10.0 7.9  NEUTROABS 7.3  --   HGB 11.4* 10.9*  HCT 34.3* 33.1*  MCV 91.2 89.9  PLT 237 228   BMP &GFR Recent Labs  Lab 05/15/23 0718 05/16/23 0521  NA 135 134*  K 4.1 4.1  CL 101 101  CO2 27 28  GLUCOSE 125* 133*  BUN 23 21  CREATININE 0.73 0.68  CALCIUM 8.4* 8.0*  MG 1.9 1.8  PHOS 4.4 4.1   Estimated Creatinine Clearance: 87.6 mL/min (by C-G formula based on SCr of 0.68 mg/dL). Liver & Pancreas: Recent Labs  Lab 05/15/23 0718 05/16/23 0521  AST 24  --   ALT 59*  --   ALKPHOS 60  --   BILITOT 0.4  --   PROT 5.2*  --   ALBUMIN 2.4* 2.4*   No results for input(s): "LIPASE", "AMYLASE" in the last 168 hours. No results for input(s): "AMMONIA" in the last 168 hours. Diabetic: No results for input(s): "HGBA1C" in the last 72 hours. Recent Labs  Lab 05/18/23 2040 05/19/23 0001 05/19/23 0413 05/19/23 0432 05/19/23 0825  GLUCAP 137* 123* 151* 127* 131*   Cardiac Enzymes: No results for input(s): "CKTOTAL", "CKMB", "CKMBINDEX", "TROPONINI" in the last 168 hours. No results for input(s): "PROBNP" in the last 8760 hours. Coagulation Profile: No results for input(s): "INR", "PROTIME" in the last 168  hours. Thyroid Function Tests: No results for input(s): "TSH", "T4TOTAL", "FREET4", "T3FREE", "THYROIDAB" in the last 72 hours. Lipid Profile: No results for input(s): "CHOL", "HDL", "LDLCALC", "TRIG", "CHOLHDL", "LDLDIRECT" in the last 72 hours.  Anemia Panel: No results for input(s): "VITAMINB12", "FOLATE", "FERRITIN", "TIBC", "IRON", "RETICCTPCT" in the last 72 hours. Urine analysis:    Component Value Date/Time   COLORURINE YELLOW 05/08/2023 1632   APPEARANCEUR HAZY (A) 05/08/2023 1632   LABSPEC >1.046 (H) 05/08/2023 1632   PHURINE 6.0 05/08/2023 1632   GLUCOSEU NEGATIVE 05/08/2023 1632   HGBUR NEGATIVE 05/08/2023 1632   BILIRUBINUR NEGATIVE 05/08/2023 1632   KETONESUR NEGATIVE 05/08/2023 1632  PROTEINUR NEGATIVE 05/08/2023 1632   NITRITE NEGATIVE 05/08/2023 1632   LEUKOCYTESUR NEGATIVE 05/08/2023 1632   Sepsis Labs: Invalid input(s): "PROCALCITONIN", "LACTICIDVEN"  Microbiology: No results found for this or any previous visit (from the past 240 hours).   Radiology Studies: DG Abd Portable 1V Result Date: 05/19/2023 CLINICAL DATA:  [id number] Abdominal pain [id number] EXAM: PORTABLE ABDOMEN - 1 VIEW COMPARISON:  04/26/2023 FINDINGS: Enteric tube distal tip projects over the expected location of the third portion of the duodenum. Nonobstructive bowel gas pattern. No gross free intraperitoneal air on supine imaging. IMPRESSION: Enteric tube distal tip projects over the expected location of the third portion of the duodenum. Electronically Signed   By: Duanne Guess D.O.   On: 05/19/2023 09:47       Tyishia Aune T. Danique Hartsough [facility]  If 7PM-7AM, please contact night-coverage [email or web address] 05/19/2023, 10:58 AM

## 2023-05-19 NOTE — Consult Note (Addendum)
 Chief Complaint: High grade B cell lymphoma; possible aspiration pneumonia; acute ischemic stroke; dysphagia - image guided percutaneous gastrostomy tube placement   Referring Provider(s): Candelaria Stagers   Supervising Physician: Irish Lack  Patient Status: [facility] - In-pt  History of Present Illness: Tyler [name] is a 73 y.o. male with a history of paroxysmal atrial fibrillation, hypertension, myelodysplastic syndrome, pulmonary embolism, and high grade B cell lymphoma.  Pt was admitted 04/22/23 after his second cycle of methotrexate/leucovorin when left sided weakness and partial sensory seizures were noted.  Since admission, pt was intubated after a partial seizure 04/25/23 and moved to ICU.  MRI brain 3/11 revealed resolving diffusion abnormality within the posterior right MCA territory and increased extent of hyperintense T2 weighted signal in right hemisphere concerning for progression of lymphoma.  Pt was extubated on 3/2 but focal seizures continued, requiring his re-intubation on 3/2.  He was extubated 3/14 and continues to be extubated.  Pt is with neck swelling, dysphagia, possible aspiration pneumonia and is NPO current receiving tube feeds via coretrack for 3 weeks.  Interventional radiology was consulted for image guided percutaneous gastrostomy tube placement.  Imaging was reviewed and approved by Dr. Milford Cage 05/17/23 for image guided percutaneous gastrostomy tube placement scheduled for 05/20/23.    Pt is currently on aspirin that cannot be held per Neurology - OK to move forward with gastrostomy tube placement 05/20/23 per Dr. Lowella Dandy.    Patient is Full Code  Past Medical History:  Diagnosis Date   Cancer [facility])    prostate   Follicular lymphoma (HCC) 2024   History of pulmonary embolism    Hypertension    Myelodysplastic syndrome (HCC)    PAF (paroxysmal atrial fibrillation) (HCC)    Recurrent UTI     Past Surgical History:  Procedure Laterality Date   APPLICATION OF  CRANIAL NAVIGATION Right 03/10/2023   Procedure: APPLICATION OF CRANIAL NAVIGATION;  Surgeon: Tressie Stalker, MD;  Location: [facility] OR;  Service: Neurosurgery;  Laterality: Right;   BIOPSY  08/21/2022   Procedure: BIOPSY;  Surgeon: Beverley Fiedler, MD;  Location: [facility] ENDOSCOPY;  Service: Gastroenterology;;   CRANIOTOMY Right 03/10/2023   Procedure: PARIETAL CRANIOTOMY;  Surgeon: Tressie Stalker, MD;  Location: [facility] OR;  Service: Neurosurgery;  Laterality: Right;   ESOPHAGOGASTRODUODENOSCOPY (EGD) WITH PROPOFOL N/A 08/21/2022   Procedure: ESOPHAGOGASTRODUODENOSCOPY (EGD) WITH PROPOFOL;  Surgeon: Beverley Fiedler, MD;  Location: [facility] ENDOSCOPY;  Service: Gastroenterology;  Laterality: N/A;   IR IMAGING GUIDED PORT INSERTION  08/26/2022   LYMPHADENECTOMY Bilateral 09/01/2019   Procedure: LYMPHADENECTOMY;  Surgeon: Sebastian Ache, MD;  Location: [facility] ORS;  Service: Urology;  Laterality: Bilateral;   ROBOT ASSISTED LAPAROSCOPIC RADICAL PROSTATECTOMY N/A 09/01/2019   Procedure: XI ROBOTIC ASSISTED LAPAROSCOPIC RADICAL PROSTATECTOMY;  Surgeon: Sebastian Ache, MD;  Location: [facility] ORS;  Service: Urology;  Laterality: N/A;  3 HRS    Allergies: Amitriptyline, Amlodipine, and Neurontin [gabapentin]  Medications: Prior to Admission medications   Medication Sig Start Date End Date Taking? Authorizing Provider  B Complex Vitamins (VITAMIN B COMPLEX) TABS Take 1 tablet by mouth daily. Patient taking differently: Take 1 tablet by mouth in the morning. 12/21/21  Yes Amin, Ankit C, MD  cholecalciferol (VITAMIN D3) 25 MCG (1000 UNIT) tablet Take 2 tablets (2,000 Units total) by mouth daily. Patient taking differently: Take 1,000 Units by mouth in the morning. 01/21/22  Yes Johney Maine, MD  dexamethasone (DECADRON) 4 MG tablet Take 1 tablet (4 mg total) by mouth 2 (two)  times daily with breakfast and lunch. 04/14/23  Yes Johney Maine, MD  folic acid (FOLVITE) 1 MG tablet Take 1 mg by mouth in the morning. 03/27/22   Yes [provider]  HYDROcodone-acetaminophen (NORCO/VICODIN) 5-325 MG tablet Take 1 tablet by mouth every 6 (six) hours as needed for moderate pain (pain score 4-6). 03/17/23  Yes Val Eagle D, NP  levETIRAcetam (KEPPRA) 500 MG tablet Take 1 tablet (500 mg total) by mouth 2 (two) times daily. 04/14/23  Yes Johney Maine, MD  metoprolol succinate (TOPROL XL) 25 MG 24 hr tablet Take 1 tablet (25 mg total) by mouth daily. Patient taking differently: Take 25 mg by mouth in the morning. 12/30/22  Yes O'Neal, Ronnald Ramp, MD  pantoprazole (PROTONIX) 40 MG tablet Take 1 tablet (40 mg total) by mouth 2 (two) times daily. For 12 weeks 04/16/23 07/15/23 Yes Johney Maine, MD  VEMLIDY 25 MG tablet TAKE 1 TABLET BY MOUTH 1 TIME A DAY Patient taking differently: Take 25 mg by mouth in the morning. 04/01/23  Yes Danelle Earthly, MD  docusate sodium (COLACE) 100 MG capsule Take 1 capsule (100 mg total) by mouth 2 (two) times daily. Patient not taking: Reported on 04/22/2023 03/17/23   Val Eagle D, NP  furosemide (LASIX) 20 MG tablet Take 1 tablet (20 mg total) by mouth daily. Patient not taking: Reported on 04/22/2023 12/30/22   Sande Rives, MD  lidocaine-prilocaine (EMLA) cream Apply to affected area once Patient not taking: Reported on 04/22/2023 09/18/22   Georga Kaufmann T, PA-C  Menthol-Methyl Salicylate (MUSCLE RUB) 10-15 % CREA Apply 1 Application topically daily as needed for muscle pain. Patient not taking: Reported on 04/22/2023    [provider]  ondansetron (ZOFRAN) 8 MG tablet Take 1 tablet (8 mg total) by mouth every 8 (eight) hours as needed for nausea or vomiting. Start on the third day after cyclophosphamide chemotherapy. Patient not taking: Reported on 04/22/2023 07/26/22   Johney Maine, MD     Family History  Problem Relation Age of Onset   Stroke Father     Social History   Socioeconomic History   Marital status: Married    Spouse name:  Not on file   Number of children: Not on file   Years of education: Not on file   Highest education level: Not on file  Occupational History   Not on file  Tobacco Use   Smoking status: Never   Smokeless tobacco: Never  Vaping Use   Vaping status: Never Used  Substance and Sexual Activity   Alcohol use: Yes    Comment: at least one 40 oz beer daily   Drug use: Never   Sexual activity: Not on file  Other Topics Concern   Not on file  Social History Narrative   Not on file   Social Drivers of Health   Financial Resource Strain: Not on File (06/14/2021)   Received from [facility], [place]   Financial Resource Strain    Financial Resource Strain: 0  Food Insecurity: No Food Insecurity (04/22/2023)   Hunger Vital Sign    Worried About Running Out of Food in the Last Year: Never true    Ran Out of Food in the Last Year: Never true  Transportation Needs: No Transportation Needs (04/22/2023)   PRAPARE - Administrator, Civil Service (Medical): No    Lack of Transportation (Non-Medical): No  Physical Activity: Not on File (06/14/2021)   Received from  [facility], [place]   Physical Activity    Physical Activity: 0  Stress: Not on File (06/14/2021)   Received from [facility] [place], [place]   Stress    Stress: 0  Social Connections: Patient Declined (04/22/2023)   Social Connection and Isolation Panel [NHANES]    Frequency of Communication with Friends and Family: Patient declined    Frequency of Social Gatherings with Friends and Family: Patient declined    Attends Religious Services: Patient declined    Database administrator or Organizations: Patient declined    Attends Banker Meetings: Patient declined    Marital Status: Patient declined  Recent Concern: Social Connections - Moderately Isolated (04/07/2023)   Social Connection and Isolation Panel [NHANES]    Frequency of Communication with Friends and Family: More than three times a week    Frequency of Social Gatherings with  Friends and Family: Once a week    Attends Religious Services: Never    Database administrator or Organizations: No    Attends Engineer, structural: Never    Marital Status: Married     Review of Systems: A 12 point ROS discussed and pertinent positives are indicated in the HPI above.  All other systems are negative.  Review of Systems  Constitutional:  Negative for fatigue and fever.  HENT:  Negative for congestion and dental problem.   Cardiovascular:  Negative for chest pain.  Gastrointestinal:  Negative for diarrhea and nausea.  Neurological:  Negative for light-headedness and headaches.  Psychiatric/Behavioral:  Negative for agitation and behavioral problems.     Vital Signs: BP (!) 131/95 (BP Location: Left Wrist)   Pulse 99   Temp 99.3 F (37.4 C) (Axillary)   Resp 20   Ht 5' 7.01" (1.702 m)   Wt 184 lb 4.9 oz (83.6 kg)   SpO2 95%   BMI 28.86 kg/m   Advance Care Plan: The advanced care place/surrogate decision maker was discussed at the time of visit and the patient did not wish to discuss or was not able to name a surrogate decision maker or provide an advance care plan.  Physical Exam HENT:     Head: Normocephalic and atraumatic.     Mouth/Throat:     Mouth: Mucous membranes are moist.  Cardiovascular:     Rate and Rhythm: Normal rate and regular rhythm.  Pulmonary:     Effort: Pulmonary effort is normal.     Breath sounds: Normal breath sounds.  Abdominal:     Palpations: Abdomen is soft.  Skin:    General: Skin is warm and dry.  Psychiatric:        Mood and Affect: Mood normal.        Behavior: Behavior normal.     Imaging: DG Abd Portable 1V Result Date: 05/19/2023 CLINICAL DATA:  [id number] Abdominal pain [id number] EXAM: PORTABLE ABDOMEN - 1 VIEW COMPARISON:  04/26/2023 FINDINGS: Enteric tube distal tip projects over the expected location of the third portion of the duodenum. Nonobstructive bowel gas pattern. No gross free intraperitoneal air on  supine imaging. IMPRESSION: Enteric tube distal tip projects over the expected location of the third portion of the duodenum. Electronically Signed   By: Duanne Guess D.O.   On: 05/19/2023 09:47   Overnight EEG with video Result Date: 05/15/2023 Charlsie Quest, MD     05/16/2023  6:23 AM Patient Name: Tyler [name] MRN: [id number] Epilepsy Attending: Charlsie Quest Referring Physician/Provider: Erick Blinks, MD Duration: 05/14/2023 1536 to 05/15/2023  1618  Patient history: 73yo M patient with 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 EEG to evaluate for seizure  Level of alertness: awake, asleep  AEDs during EEG study: LEV, LCM, GBP  Technical aspects: This EEG study was done with scalp electrodes positioned according to the 10-20 International system of electrode placement. Electrical activity was reviewed with band pass filter of 1-70Hz , sensitivity of 7 uV/mm, display speed of 7mm/sec with a 60Hz  notched filter applied as appropriate. EEG data were recorded continuously and digitally stored.  Video monitoring was available and reviewed as appropriate.  Description:  The posterior dominant rhythm consists of 8-9 Hz activity of moderate voltage (25-35 uV) seen predominantly in posterior head regions, asymmetric ( right<left) and reactive to eye opening and eye closing. Sleep was characterized by sleep spindles (12 to 14 Hz), maximal frontocentral region.  EEG showed continuous generalized and maximal right parietal 3 to 6 Hz theta-delta slowing. Lateralized periodic discharges were noted in right hemisphere, maximal right parietal region with fluctuating frequency of 0.5-1Hz , predominantly when awake. Hyperventilation and photic stimulation were not performed.    ABNORMALITY -Lateralized periodic discharges, right hemisphere, maximal right parietal  region ( LPD) -Continuous slow, generalized and maximal right parietal region  IMPRESSION: This study showed evidence of epileptogenicity  arising from right hemisphere, maximal right parietal region. Additionally there is cortical dysfunction in right hemisphere, maximal right parietal region likely secondary to underlying structural abnormality.  Lastly there is mild diffuse encephalopathy. No  seizures were noted.  Charlsie Quest   DG CHEST PORT 1 VIEW Result Date: 05/13/2023 CLINICAL DATA:  Pneumonia. EXAM: PORTABLE CHEST 1 VIEW COMPARISON:  Chest x-ray dated May 08, 2023. FINDINGS: Interval extubation. Unchanged feeding tube and right chest wall port catheter. Stable cardiomediastinal silhouette prominent mediastinal contours due to underlying mediastinal fat as seen on recent chest CT. Minimal linear atelectasis and/or scarring in the peripheral left lung. Density at the right lung base posterior to the for of could represent trace pleural fluid in the right minor fissure versus atelectasis. No pneumothorax. No acute osseous abnormality. IMPRESSION: 1. Interval extubation. 2. Trace pleural fluid in the right minor fissure versus basilar atelectasis. Electronically Signed   By: Obie Dredge M.D.   On: 05/13/2023 10:06   CT ANGIO HEAD NECK W WO CM Result Date: 05/08/2023 CLINICAL DATA:  Stroke follow-up EXAM: CT ANGIOGRAPHY HEAD AND NECK WITH AND WITHOUT CONTRAST TECHNIQUE: Multidetector CT imaging of the head and neck was performed using the standard protocol during bolus administration of intravenous contrast. Multiplanar CT image reconstructions and MIPs were obtained to evaluate the vascular anatomy. Carotid stenosis measurements (when applicable) are obtained utilizing NASCET criteria, using the distal internal carotid diameter as the denominator. RADIATION DOSE REDUCTION: This exam was performed according to the departmental dose-optimization program which includes automated exposure control, adjustment of the mA and/or kV according to patient size and/or use of iterative reconstruction technique. CONTRAST:  75mL OMNIPAQUE  IOHEXOL 350 MG/ML SOLN COMPARISON:  05/06/2023 FINDINGS: CT HEAD FINDINGS Brain: Hypoattenuation throughout much of the right hemisphere, corresponding to the area of hyperintense T2-weighted signal on recent MRI. No intracranial hemorrhage. Vascular: No hyperdense vessel or unexpected vascular calcification. Skull: Remote right posterior craniotomy. Sinuses/Orbits: Bilateral maxillary and ethmoid sinus mucosal thickening. CTA NECK FINDINGS Skeleton: No acute abnormality or high grade bony spinal canal stenosis. Other neck: Normal pharynx, larynx and major salivary glands. No cervical lymphadenopathy. Unremarkable thyroid gland. Upper chest: No pneumothorax or pleural effusion. No nodules or  masses. Aortic arch: There is no calcific atherosclerosis of the aortic arch. Conventional 3 vessel aortic branching pattern. RIGHT carotid system: Normal without aneurysm, dissection or stenosis. LEFT carotid system: Normal without aneurysm, dissection or stenosis. Vertebral arteries: Left dominant configuration. There is no dissection, occlusion or flow-limiting stenosis to the skull base (V1-V3 segments). CTA HEAD FINDINGS POSTERIOR CIRCULATION: Vertebral arteries are normal. No proximal occlusion of the anterior or inferior cerebellar arteries. Basilar artery is normal. Superior cerebellar arteries are normal. Posterior cerebral arteries are normal. ANTERIOR CIRCULATION: Intracranial internal carotid arteries are normal. Anterior cerebral arteries are normal. Middle cerebral arteries are normal. Venous sinuses: As permitted by contrast timing, patent. Anatomic variants: None Review of the MIP images confirms the above findings. IMPRESSION: 1. No emergent large vessel occlusion or high-grade stenosis of the intracranial arteries. 2. Hypoattenuation throughout much of the right hemisphere, corresponding to the area of hyperintense T2-weighted signal on recent MRI. No intracranial hemorrhage. Electronically Signed   By: Deatra Robinson M.D.   On: 05/08/2023 20:07   DG Chest Port 1 View Result Date: 05/08/2023 CLINICAL DATA:  Acute respiratory failure. EXAM: PORTABLE CHEST 1 VIEW COMPARISON:  May 06, 2023. FINDINGS: Stable cardiomegaly. Endotracheal and feeding tubes are unchanged. Right internal jugular catheter is unchanged. Right lung is clear. Minimal left basilar subsegmental atelectasis is noted. IMPRESSION: Stable support apparatus. Minimal left basilar subsegmental atelectasis. Electronically Signed   By: Lupita Raider M.D.   On: 05/08/2023 09:39   VAS [place] LOWER EXTREMITY VENOUS (DVT) Result Date: 05/07/2023  Lower Venous DVT Study Patient Name:  Tyler [name]  Date of Exam:   05/06/2023 Medical Rec #: [id number]     Accession #:    [id number] Date of Birth: 01-Dec-1950     Patient Gender: M Patient Age:   59 years Exam Location:  [facility] Procedure:      VAS [place] LOWER EXTREMITY VENOUS (DVT) Referring Phys: Chilton Greathouse --------------------------------------------------------------------------------  Indications: Swelling, and Edema. Other Indications: A-fib, H/O P.E. Comparison Study: Previous study on 7.12.2021. Performing Technologist: Fernande Bras  Examination Guidelines: A complete evaluation includes B-mode imaging, spectral Doppler, color Doppler, and power Doppler as needed of all accessible portions of each vessel. Bilateral testing is considered an integral part of a complete examination. Limited examinations for reoccurring indications may be performed as noted. The reflux portion of the exam is performed with the patient in reverse Trendelenburg.  +---------+---------------+---------+-----------+----------+--------------+ RIGHT    CompressibilityPhasicitySpontaneityPropertiesThrombus Aging +---------+---------------+---------+-----------+----------+--------------+ CFV      Full           Yes      Yes                                  +---------+---------------+---------+-----------+----------+--------------+ SFJ      Full           Yes      Yes                                 +---------+---------------+---------+-----------+----------+--------------+ FV Prox  Full                                                        +---------+---------------+---------+-----------+----------+--------------+  FV Mid   Full                                                        +---------+---------------+---------+-----------+----------+--------------+ FV DistalFull                                                        +---------+---------------+---------+-----------+----------+--------------+ PFV      Full                                                        +---------+---------------+---------+-----------+----------+--------------+ POP      Full           Yes      Yes                                 +---------+---------------+---------+-----------+----------+--------------+ PTV      Full                                                        +---------+---------------+---------+-----------+----------+--------------+ PERO     Full                                                        +---------+---------------+---------+-----------+----------+--------------+   +---------+---------------+---------+-----------+----------+--------------+ LEFT     CompressibilityPhasicitySpontaneityPropertiesThrombus Aging +---------+---------------+---------+-----------+----------+--------------+ CFV      Full           Yes      Yes                                 +---------+---------------+---------+-----------+----------+--------------+ SFJ      Full           Yes      Yes                                 +---------+---------------+---------+-----------+----------+--------------+ FV Prox  Full                                                         +---------+---------------+---------+-----------+----------+--------------+ FV Mid   Full                                                        +---------+---------------+---------+-----------+----------+--------------+  FV DistalFull                                                        +---------+---------------+---------+-----------+----------+--------------+ PFV      Full                                                        +---------+---------------+---------+-----------+----------+--------------+ POP      Full           Yes      Yes                                 +---------+---------------+---------+-----------+----------+--------------+ PTV      Full                                                        +---------+---------------+---------+-----------+----------+--------------+ PERO     Full                                                        +---------+---------------+---------+-----------+----------+--------------+     Summary: BILATERAL: - No evidence of deep vein thrombosis seen in the lower extremities, bilaterally. -No evidence of popliteal cyst, bilaterally.   *See table(s) above for measurements and observations. Electronically signed by Coral Else MD on 05/07/2023 at 8:38:36 PM.    Final    MR BRAIN WO CONTRAST Result Date: 05/07/2023 CLINICAL DATA:  Stroke follow-up EXAM: MRI HEAD WITHOUT CONTRAST TECHNIQUE: Multiplanar, multiecho pulse sequences of the brain and surrounding structures were obtained without intravenous contrast. COMPARISON:  04/24/2023, 04/07/2023 FINDINGS: Brain: Resolving diffusion abnormality within the posterior right MCA territory. No acute infarct. Unchanged appearance of intraparenchymal hemorrhage in the posterior right parietal lobe. Confluent hyperintense T2-weighted signal within the posterior right hemisphere now extends more anteriorly with greater involvement of the anterior right temporal lobe, right insula and  frontal operculum. There is multifocal hyperintense T2-weighted signal within the white matter. Generalized volume loss. The midline structures are normal. Vascular: Normal flow voids. Skull and upper cervical spine: Normal calvarium and skull base. Visualized upper cervical spine and soft tissues are normal. Sinuses/Orbits:Moderate paranasal sinus mucosal thickening. Normal orbits. IMPRESSION: 1. Resolving diffusion abnormality within the posterior right MCA territory. 2. Increased extent of hyperintense T2-weighted signal in the right hemisphere. This could indicate progression of lymphoma, or confluence of post-ischemic/post-ictal edema cytotoxic edema and lymphoma-related vasogenic edema. Compared to 04/07/2023, however, the area of abnormal signal has decreased. 3. Unchanged appearance of intraparenchymal hemorrhage in the posterior right parietal lobe. Electronically Signed   By: Deatra Robinson M.D.   On: 05/07/2023 00:52   CT HEAD WO CONTRAST ( ) Addendum Date: 05/06/2023 ADDENDUM REPORT: 05/06/2023 14:37 ADDENDUM: Study discussed by telephone with Dr. Lindie Spruce on 05/06/2023 at 1431 hours. Electronically Signed  By: Odessa Fleming M.D.   On: 05/06/2023 14:37   Result Date: 05/06/2023 CLINICAL DATA:  73 year old male with seizure. Right side weakness. Intubated. History of treated CNS lymphoma. EXAM: CT HEAD WITHOUT CONTRAST TECHNIQUE: Contiguous axial images were obtained from the base of the skull through the vertex without intravenous contrast. RADIATION DOSE REDUCTION: This exam was performed according to the departmental dose-optimization program which includes automated exposure control, adjustment of the mA and/or kV according to patient size and/or use of iterative reconstruction technique. COMPARISON:  Head CT 04/26/2023 and earlier. FINDINGS: Brain: Confluent cytotoxic edema now in the right MCA territory, including all of the insula, operculum, most of the posterior right MCA territory. No  hemorrhagic transformation. No significant midline shift or intracranial mass effect. No ventriculomegaly. No acute intracranial hemorrhage identified. Superimposed chronic postoperative, post treatment changes to the posterior right parietal lobe with dystrophic calcification there, stable. Stable since 04/26/2023 gray-white differentiation elsewhere. Basilar cisterns are patent. Vascular: Calcified atherosclerosis at the skull base. No suspicious intracranial vascular hyperdensity. Skull: Stable appearance of right posterior parietal craniotomy. Sinuses/Orbits: Right nasoenteric tube remains in place. Increased sinus mucosal thickening and opacification from earlier this month. Tympanic cavities and mastoids remain well aerated. Other: EEG leads in place. No acute orbit or scalp soft tissue finding. IMPRESSION: 1. 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 No hemorrhagic transformation. No significant intracranial mass effect. 2. Otherwise stable post treatment appearance of the brain, right parietal tumor resection. Electronically Signed: By: Odessa Fleming M.D. On: 05/06/2023 14:16   DG Chest Port 1 View Result Date: 05/06/2023 CLINICAL DATA:  Acute respiratory failure. EXAM: PORTABLE CHEST 1 VIEW COMPARISON:  Radiograph 05/03/2023 FINDINGS: Endotracheal tube tip 5.7 cm from the carina. Weighted enteric tube in place, tip not included in the field of view. Accessed right chest port in place. Stable heart size and mediastinal contours. No confluent airspace disease, large pleural effusion or pneumothorax. No pulmonary edema. Left second rib fracture. IMPRESSION: 1. Endotracheal tube tip 5.7 cm from the carina. 2. Weighted enteric tube in place, tip not included in the field of view. 3. No acute findings. Electronically Signed   By: Narda Rutherford M.D.   On: 05/06/2023 11:01   DG  CHEST PORT 1 VIEW Result Date: 05/03/2023 CLINICAL DATA:  73 year old male intubated. EXAM: PORTABLE CHEST 1 VIEW COMPARISON:  Portable chest yesterday. FINDINGS: Portable AP upright view at 0923 hours. Mildly rotated to the right. Endotracheal tube tip in good position between the clavicles and carina. Enteric feeding tube courses to the left abdomen, tip not included. Stable right chest power port. Stable cardiac size and mediastinal contours. Stable lung volumes and ventilation. No pneumothorax, pulmonary edema, consolidation. Negative visible bowel gas. IMPRESSION: 1. Satisfactory endotracheal tube placement. Enteric feeding tube remains in place. 2.  No convincing acute cardiopulmonary abnormality. Electronically Signed   By: Odessa Fleming M.D.   On: 05/03/2023 12:57   DG Chest Port 1 View Result Date: 05/02/2023 CLINICAL DATA:  Shortness of breath.  History of lymphoma. EXAM: PORTABLE CHEST 1 VIEW COMPARISON:  Radiograph yesterday.  Chest CT 04/26/2023 FINDINGS: Right chest port in place. Weighted enteric tube tip below the diaphragm. Lung volumes are low. Stable heart size and mediastinal contours. Bibasilar atelectasis, left greater than right. No new airspace disease. No pleural fluid or pneumothorax. No pulmonary edema. IMPRESSION: Hypoventilatory chest with bibasilar atelectasis. Electronically Signed  By: Narda Rutherford M.D.   On: 05/02/2023 21:50   DG CHEST PORT 1 VIEW Result Date: 05/01/2023 CLINICAL DATA:  Fever EXAM: PORTABLE CHEST 1 VIEW COMPARISON:  04/27/2023 FINDINGS: Cardiomegaly. Right Port-A-Cath remains in place, unchanged. No confluent airspace opacities or effusions. No acute bony abnormality. IMPRESSION: No active disease. Electronically Signed   By: Charlett Nose M.D.   On: 05/01/2023 12:23   DG Swallowing Func-Speech Pathology Result Date: 04/28/2023 Table formatting from the original result was not included. Modified Barium Swallow Study Patient Details Name: Tyler [name] MRN:  [id number] Date of Birth: June 12, 1950 Today's Date: 04/28/2023 HPI/PMH: HPI: 73 yo male presents to [facility] [place] on [facility] with LUE and LE weakness, and change in mentation. Head CT of brain shows a recurrent tumor at the craniotomy site and increased size of recurrent tumor along with a left midline shift up to 6 mm. 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 Pt's swallowing was evaluated when he was admitted in June of 2024 - he was D/Cd on a dysphagia 3 diet with thin liquids with no f/u recommended. Pt's swallowing assessed again on 04/08/23 this admission with recommendation of a regular diet and thin liquids. Hospital course complicated by concern for neck swelling, acute dysphagia, and concern for airway protection prompting intubation from 02/28-03/02. Neck CT on 04/26/23: demonstrated "No acute finding. Assessment of mucosal surfaces limited by airway  collapse in the setting of intubation.". Clinical Impression: Pt presents with a moderate-severe oral dysphagia and a mild pharyngeal dysphagia per results of MBSS completed today. Oral deficits included poor bolus awareness, oral holding, reduced labial seal resulting in anterior spillage, poor lingual movement or initiation for oral transit, and reduced oral coordination resulting in posterior loss of trials. Oral deficits were more pronounced with nectar-thick liquid, puree, and soft solid trials. Soft solid trial was removed from oral cavity due to minimal-no mastication efforts.  Pt required multiple verbal and tactile cues to eventually initiate oral transit of nectar-thick and puree trials. Tactile cues included downward pressure of spoon on tongue multiple times. Pharyngeal dysphagia characterized by reduced hyolaryngeal elevation, brief  mistiming of pharyngeal swallow initiation, reduced base of tongue retraction, reduced laryngeal vestibule closure, and reduced pharyngeal stripping. Findings: -There was x1 instance of trace-min silent aspiration of thin liquids (via straw) before the swallow. Aspiration occurred posteriorly from the pyriform sinuses due to pooling of liquids before swallow was initiated. Aspiration event was completely silent without cough, water eyes, increased RR, or any other subtle s/s of aspiration. -There was intermittent, trace, shallow, transient penetration of nectar-thick and thin liquids by cup. -There was shallow penetration without ejection of mixture of thin and nectar-thick residue towards end of study. Penetrated residue appeared to move slowly towards the airway, though remained above the vocal cords during assessment. Pt unable to follow directions for strong cough to attempt to clear residue. Diet recommendations outlined below. SLP will follow up for ongoing diet tolerance assessment and therapeutic PO trials of pureed solids to address oral deficits. Recommend continue tube feeds via Cortrak for nutritional support and medication delivery. Factors that may increase risk of adverse event in presence of aspiration Rubye Oaks & Clearance Coots 2021): Factors that may increase risk of adverse event in presence of aspiration Rubye Oaks & Clearance Coots 2021): Reduced cognitive function; Presence of tubes (ETT, trach, NG,  etc.); Weak cough; Dependence for feeding and/or oral hygiene; Inadequate oral hygiene Recommendations/Plan: Swallowing Evaluation Recommendations Swallowing Evaluation Recommendations Recommendations: PO diet PO Diet Recommendation: Clear liquid diet; Thin liquids (Level 0) (ok for any thin liquids) Liquid Administration via: Cup; No straw Medication Administration: Via alternative means Supervision: Staff to assist with self-feeding; Full supervision/cueing for swallowing strategies Swallowing strategies  : Minimize  environmental distractions; Small bites/sips; Check for anterior loss Postural changes: Position pt fully upright for meals; Stay upright 30-60 min after meals Oral care recommendations: Oral care QID (4x/day); Staff/trained caregiver to provide oral care; Use suctioning for oral care Caregiver Recommendations: Have oral suction available Treatment Plan Treatment Plan Treatment recommendations: Therapy as outlined in treatment plan below Follow-up recommendations: Follow physicians's recommendations for discharge plan and follow up therapies (SLP services at next level of care) Functional status assessment: Patient has had a recent decline in their functional status and demonstrates the ability to make significant improvements in function in a reasonable and predictable amount of time. Treatment frequency: Min 2x/week Treatment duration: 2 weeks Interventions: Aspiration precaution training; Compensatory techniques; Patient/family education; Trials of upgraded texture/liquids; Diet toleration management by SLP; Oropharyngeal exercises Recommendations Recommendations for follow up therapy are one component of a multi-disciplinary discharge planning process, led by the attending physician.  Recommendations may be updated based on patient status, additional functional criteria and insurance authorization. Assessment: Orofacial Exam: Orofacial Exam Oral Cavity: Oral Hygiene: WFL Oral Cavity - Dentition: Adequate natural dentition Orofacial Anatomy: WFL Oral Motor/Sensory Function: Suspected cranial nerve impairment (limited assessment, though concern for oral apraxia) Anatomy: Anatomy: WFL Boluses Administered: Boluses Administered Boluses Administered: Mildly thick liquids (Level 2, nectar thick); Thin liquids (Level 0); Puree; Solid  Oral Impairment Domain: Oral Impairment Domain Lip Closure: Escape beyond mid-chin Tongue control during bolus hold: Posterior escape of greater than half of bolus Bolus  preparation/mastication: Minimal chewing/mashing with majority of bolus unchewed (removed from oral cavity) Bolus transport/lingual motion: Minimal-no tongue motion Oral residue: Majority of bolus remaining Location of oral residue : Floor of mouth; Tongue Initiation of pharyngeal swallow : Pyriform sinuses  Pharyngeal Impairment Domain: Pharyngeal Impairment Domain Soft palate elevation: No bolus between soft palate (SP)/pharyngeal wall (PW) Laryngeal elevation: Partial superior movement of thyroid cartilage/partial approximation of arytenoids to epiglottic petiole Anterior hyoid excursion: Complete anterior movement Epiglottic movement: Complete inversion Laryngeal vestibule closure: Incomplete, narrow column air/contrast in laryngeal vestibule Pharyngeal stripping wave : Present - diminished Pharyngeal contraction (A/P view only): N/A Pharyngoesophageal segment opening: Complete distension and complete duration, no obstruction of flow Tongue base retraction: Trace column of contrast or air between tongue base and PPW Pharyngeal residue: Collection of residue within or on pharyngeal structures Location of pharyngeal residue: Tongue base; Valleculae  Esophageal Impairment Domain: No data recorded Pill: No data recorded Penetration/Aspiration Scale Score: Penetration/Aspiration Scale Score 1.  Material does not enter airway: Thin liquids (Level 0); Mildly thick liquids (Level 2, nectar thick); Puree 2.  Material enters airway, remains ABOVE vocal cords then ejected out: Thin liquids (Level 0); Mildly thick liquids (Level 2, nectar thick) 3.  Material enters airway, remains ABOVE vocal cords and not ejected out: Thin liquids (Level 0); Mildly thick liquids (Level 2, nectar thick) 8.  Material enters airway, passes BELOW cords without attempt by patient to eject out (silent aspiration) : Thin liquids (Level 0) Compensatory Strategies: Compensatory Strategies Compensatory strategies: Yes Straw: Ineffective Ineffective  Straw: Thin liquid (Level 0) Other(comment): Ineffective (tactile pressure with spoon in effort to initiate oral transit)  Ineffective Other(comment): Mildly thick liquid (Level 2, nectar thick); Moderately thick liquid (Level 3, honey thick)   General Information: Caregiver present: No (follow up education completed on phone with daughter)  Diet Prior to this Study: NPO; Cortrak/Small bore NG tube   Temperature : Febrile   Respiratory Status: WFL   Supplemental O2: Nasal cannula (2L)   History of Recent Intubation: Yes  Behavior/Cognition: Alert; Cooperative; Pleasant mood Self-Feeding Abilities: Needs set-up for self-feeding Baseline vocal quality/speech: -- (intermittent vocal wetness) Volitional Cough: Unable to elicit Volitional Swallow: Unable to elicit Exam Limitations: No limitations Goal Planning: Prognosis for improved oropharyngeal function: Fair Barriers to Reach Goals: Cognitive deficits No data recorded Patient/Family Stated Goal: drink water Consulted and agree with results and recommendations: Patient; Family member/caregiver; Nurse Pain: Pain Assessment Pain Assessment: No/denies pain Breathing: 0 Negative Vocalization: 0 Facial Expression: 0 Body Language: 1 Consolability: 0 PAINAD Score: 1 Facial Expression: 0 Body Movements: 0 Muscle Tension: 0 Compliance with ventilator (intubated pts.): N/A Vocalization (extubated pts.): 0 CPOT Total: 0 End of Session: Start Time:SLP Start Time (ACUTE ONLY): 0900 Stop Time: SLP Stop Time (ACUTE ONLY): 0930 Time Calculation:SLP Time Calculation (min) (ACUTE ONLY): 30 min Charges: SLP Evaluations $ SLP Speech Visit: 1 Visit SLP Evaluations $BSS Swallow: 1 Procedure $MBS Swallow: 1 Procedure SLP visit diagnosis: SLP Visit Diagnosis: Dysphagia, oropharyngeal phase (R13.12) Past Medical History: Past Medical History: Diagnosis Date  Cancer (HCC)   prostate  Follicular lymphoma (HCC) 2024  History of pulmonary embolism   Hypertension   Myelodysplastic syndrome (HCC)    PAF (paroxysmal atrial fibrillation) (HCC)   Recurrent UTI  Past Surgical History: Past Surgical History: Procedure Laterality Date  APPLICATION OF CRANIAL NAVIGATION Right 03/10/2023  Procedure: APPLICATION OF CRANIAL NAVIGATION;  Surgeon: Tressie Stalker, MD;  Location: [facility] OR;  Service: Neurosurgery;  Laterality: Right;  BIOPSY  08/21/2022  Procedure: BIOPSY;  Surgeon: Beverley Fiedler, MD;  Location: [facility] ENDOSCOPY;  Service: Gastroenterology;;  CRANIOTOMY Right 03/10/2023  Procedure: PARIETAL CRANIOTOMY;  Surgeon: Tressie Stalker, MD;  Location: [facility] OR;  Service: Neurosurgery;  Laterality: Right;  ESOPHAGOGASTRODUODENOSCOPY (EGD) WITH PROPOFOL N/A 08/21/2022  Procedure: ESOPHAGOGASTRODUODENOSCOPY (EGD) WITH PROPOFOL;  Surgeon: Beverley Fiedler, MD;  Location: [facility] ENDOSCOPY;  Service: Gastroenterology;  Laterality: N/A;  IR IMAGING GUIDED PORT INSERTION  08/26/2022  LYMPHADENECTOMY Bilateral 09/01/2019  Procedure: LYMPHADENECTOMY;  Surgeon: Sebastian Ache, MD;  Location: [facility] ORS;  Service: Urology;  Laterality: Bilateral;  ROBOT ASSISTED LAPAROSCOPIC RADICAL PROSTATECTOMY N/A 09/01/2019  Procedure: XI ROBOTIC ASSISTED LAPAROSCOPIC RADICAL PROSTATECTOMY;  Surgeon: Sebastian Ache, MD;  Location: [facility] ORS;  Service: Urology;  Laterality: N/A;  3 HRS [name] 04/28/2023, 11:06 AM  Overnight EEG with video Result Date: 04/28/2023 Charlsie Quest, MD     05/15/2023  9:26 AM Patient Name: Tyler [name] MRN: [id number] Epilepsy Attending: Charlsie Quest Referring Physician/Provider: Gordy Councilman, MD Duration: 04/27/2023 1608 to 04/28/2023 1608 Patient history: 73yo M patient with 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 EEG to evaluate for seizure Level of alertness: Awake/lethargic AEDs during EEG study: LEV, LCM Technical aspects: This EEG study was done with scalp electrodes positioned according to the 10-20 International system of electrode placement. Electrical activity was reviewed with band pass filter of  1-70Hz , sensitivity of 7 uV/mm, display speed of 59mm/sec with a 60Hz  notched filter applied as appropriate. EEG data were recorded continuously and digitally stored.  Video monitoring was available and reviewed as appropriate. Description: No clear posterior dominant rhythm was  seen.  EEG showed continuous generalized and maximal right parietal 3 to 6 Hz theta-delta slowing. Lateralized periodic discharges were noted in right hemisphere, maximal right parietal region at 1 Hz, at times with overlying rhythmicity. Seizures without clinical signs were noted arising from right parietal region, average 5 seizures per hour, lasting 1 to 2 minutes each.  During seizure EEG showed 5 to 6 Hz theta slowing admixed with sharp waves and right parietal region which then involved all of right hemisphere and evolved into 2 to 3 Hz delta slowing.  IV Vimpat was administered on 04/28/2023 at around 1042. Gradually after around 1300, EEG improved and showed lateralized periodic discharges in right hemisphere, maximal right parietal region at 1hz  with overlying rhythmicity.  Hyperventilation and photic stimulation were not performed.   ABNORMALITY -Seizure without clinical signs, right parietal region -Lateralized periodic discharges with overlying rhythmicity, right hemisphere, maximal right parietal  region ( LPD+R) -Continuous slow, generalized and maximal right parietal region IMPRESSION: This study showed seizures without clinical signs arising from right parietal region, average 5 seizures per hour lasting 1 to 2 minutes each.  IV Vimpat was administered on 04/28/2023 at around 1042.  Gradually after around 1300, EEG improved and no definite seizures were noted. Additionally there is evidence of epileptogenicity and cortical dysfunction arising from right hemisphere, maximal right parietal region likely secondary to underlying structural abnormality.  Lastly there is moderate diffuse encephalopathy. Charlsie Quest   DG CHEST  PORT 1 VIEW Result Date: 04/27/2023 CLINICAL DATA:  Acute hypoxemic respiratory failure. EXAM: PORTABLE CHEST 1 VIEW COMPARISON:  Chest radiograph dated 04/26/2023. FINDINGS: The heart is enlarged. An endotracheal tube terminates in the midthoracic trachea. An enteric tube enters the stomach and terminates below the field of view. A right internal jugular central venous port catheter tip overlies the right atrium. There is mild bibasilar atelectasis/airspace disease, similar to prior exam. IMPRESSION: Mild bibasilar atelectasis/airspace disease, similar to prior exam. Electronically Signed   By: Romona Curls M.D.   On: 04/27/2023 14:57   ECHOCARDIOGRAM COMPLETE Result Date: 04/26/2023    ECHOCARDIOGRAM REPORT   Patient Name:   Tyler [name] Date of Exam: 04/26/2023 Medical Rec #:  [id number]    Height:       67.0 in Accession #:    [id number]   Weight:       184.1 lb Date of Birth:  [birth year]    BSA:          1.952 m Patient Age:    72 years     BP:           151/88 mmHg Patient Gender: M            HR:           48 bpm. Exam Location:  Inpatient Procedure: 2D Echo, Color Doppler and Cardiac Doppler (Both Spectral and Color            Flow Doppler were utilized during procedure). Indications:    Resp Distress  History:        Patient has prior history of Echocardiogram examinations, most                 recent 10/03/2022. Chemo; Risk Factors:Hypertension.  Sonographer:    Amy Chionchio Referring Phys: [id number] MURALI RAMASWAMY IMPRESSIONS  1. Left ventricular ejection fraction, by estimation, is 60 to 65%. The left ventricle has normal function. The left ventricle has no regional wall motion abnormalities. There is mild left ventricular hypertrophy.  Left ventricular diastolic parameters are indeterminate.  2. Right ventricular systolic function is normal. The right ventricular size is normal. Tricuspid regurgitation signal is inadequate for assessing PA pressure.  3. The mitral valve is normal in structure. Trivial mitral  valve regurgitation. No evidence of mitral stenosis.  4. The aortic valve is tricuspid. Aortic valve regurgitation is not visualized. No aortic stenosis is present.  5. The inferior vena cava is dilated in size with <50% respiratory variability, suggesting right atrial pressure of 15 mmHg. FINDINGS  Left Ventricle: Left ventricular ejection fraction, by estimation, is 60 to 65%. The left ventricle has normal function. The left ventricle has no regional wall motion abnormalities. Strain imaging was not performed. The left ventricular internal cavity  size was normal in size. There is mild left ventricular hypertrophy. Left ventricular diastolic parameters are indeterminate. Right Ventricle: The right ventricular size is normal. No increase in right ventricular wall thickness. Right ventricular systolic function is normal. Tricuspid regurgitation signal is inadequate for assessing PA pressure. Left Atrium: Left atrial size was normal in size. Right Atrium: Right atrial size was normal in size. Pericardium: There is no evidence of pericardial effusion. Mitral Valve: The mitral valve is normal in structure. Trivial mitral valve regurgitation. No evidence of mitral valve stenosis. MV peak gradient, 2.4 mmHg. The mean mitral valve gradient is 1.0 mmHg. Tricuspid Valve: The tricuspid valve is normal in structure. Tricuspid valve regurgitation is trivial. Aortic Valve: The aortic valve is tricuspid. Aortic valve regurgitation is not visualized. No aortic stenosis is present. Aortic valve mean gradient measures 3.0 mmHg. Aortic valve peak gradient measures 5.7 mmHg. Aortic valve area, by VTI measures 2.11 cm. Pulmonic Valve: The pulmonic valve was grossly normal. Pulmonic valve regurgitation is not visualized. Aorta: The aortic root and ascending aorta are structurally normal, with no evidence of dilitation. Venous: The inferior vena cava is dilated in size with less than 50% respiratory variability, suggesting right atrial  pressure of 15 mmHg. IAS/Shunts: The interatrial septum was not well visualized. Additional Comments: 3D imaging was not performed.  LEFT VENTRICLE PLAX 2D LVIDd:         4.20 cm     Diastology LVIDs:         2.70 cm     LV e' medial:    5.11 cm/s LV PW:         1.10 cm     LV E/e' medial:  14.0 LV IVS:        0.90 cm     LV e' lateral:   7.94 cm/s LVOT diam:     2.20 cm     LV E/e' lateral: 9.0 LV SV:         58 LV SV Index:   30 LVOT Area:     3.80 cm  LV Volumes (MOD) LV vol d, MOD A2C: 69.9 ml LV vol d, MOD A4C: 90.8 ml LV vol s, MOD A2C: 23.8 ml LV vol s, MOD A4C: 29.9 ml LV SV MOD A2C:     46.1 ml LV SV MOD A4C:     90.8 ml LV SV MOD BP:      53.6 ml RIGHT VENTRICLE             IVC RV Basal diam:  3.10 cm     IVC diam: 2.20 cm RV S prime:     14.90 cm/s TAPSE (M-mode): 1.7 cm LEFT ATRIUM  Index        RIGHT ATRIUM          Index LA Vol (A2C):   31.3 ml 16.03 ml/m  RA Area:     9.52 cm LA Vol (A4C):   54.0 ml 27.66 ml/m  RA Volume:   14.30 ml 7.32 ml/m LA Biplane Vol: 43.6 ml 22.33 ml/m  AORTIC VALVE                    PULMONIC VALVE AV Area (Vmax):    2.48 cm     PV Vmax:       0.77 m/s AV Area (Vmean):   2.33 cm     PV Peak grad:  2.4 mmHg AV Area (VTI):     2.11 cm AV Vmax:           119.00 cm/s AV Vmean:          78.400 cm/s AV VTI:            0.275 m AV Peak Grad:      5.7 mmHg AV Mean Grad:      3.0 mmHg LVOT Vmax:         77.50 cm/s LVOT Vmean:        48.100 cm/s LVOT VTI:          0.153 m LVOT/AV VTI ratio: 0.56  AORTA Ao Root diam: 3.30 cm Ao Asc diam:  3.40 cm MITRAL VALVE MV Area (PHT): 2.70 cm    SHUNTS MV Area VTI:   2.10 cm    Systemic VTI:  0.15 m MV Peak grad:  2.4 mmHg    Systemic Diam: 2.20 cm MV Mean grad:  1.0 mmHg MV Vmax:       0.77 m/s MV Vmean:      35.3 cm/s MV Decel Time: 281 msec MV E velocity: 71.70 cm/s MV A velocity: 83.10 cm/s MV E/A ratio:  0.86 Epifanio Lesches MD Electronically signed by Epifanio Lesches MD Signature Date/Time: 04/26/2023/2:18:08 PM     Final    DG CHEST PORT 1 VIEW Result Date: 04/26/2023 CLINICAL DATA:  Endotracheal tube present. EXAM: PORTABLE CHEST 1 VIEW COMPARISON:  Radiograph yesterday.  CT earlier today FINDINGS: Endotracheal tube tip 4.6 cm from the carina. Weighted enteric tube tip below the diaphragm not included in the field of view. Right chest port unchanged in position. Stable heart size and mediastinal contours. Left lung base opacity has progressed from yesterday's radiograph, subtotal lower lobe collapse on CT. Additional streaky atelectasis in the left mid lung. Right lower lobe opacity on CT has no definite radiographic correlate. No pneumothorax or large pleural effusion. IMPRESSION: 1. Endotracheal tube tip 4.6 cm from the carina. 2. Left lung base opacity has progressed from yesterday's radiograph, subtotal lower lobe collapse on this morning's CT. Additional streaky atelectasis in the left mid lung. Electronically Signed   By: Narda Rutherford M.D.   On: 04/26/2023 13:27   CT CHEST W CONTRAST Result Date: 04/26/2023 CLINICAL DATA:  Head neck cancer.  Airway compromise with intubation EXAM: CT CHEST WITH CONTRAST TECHNIQUE: Multidetector CT imaging of the chest was performed during intravenous contrast administration. RADIATION DOSE REDUCTION: This exam was performed according to the departmental dose-optimization program which includes automated exposure control, adjustment of the mA and/or kV according to patient size and/or use of iterative reconstruction technique. CONTRAST:  OMNIPAQUE IOHEXOL 300 MG/ML  SOLN COMPARISON:  Head CT 04/16/2023 FINDINGS: Cardiovascular: Normal heart size. No pericardial  effusion. Atheromatous calcification of the coronary arteries. Porta catheter with tip at the upper right atrium. Mediastinum/Nodes: Located endotracheal and enteric tube. No mass or adenopathy. Lungs/Pleura: Multi segment opacification in the lower lobes with volume loss, worse on the left. No consolidation,  edema, effusion, or pneumothorax. Upper Abdomen: Enteric tube with tip at the pylorus. No acute finding Musculoskeletal: Thoracic spondylosis.  No acute finding IMPRESSION: Multi segment atelectasis in the lower lobes Electronically Signed   By: Tiburcio Pea M.D.   On: 04/26/2023 06:31   CT HEAD WO CONTRAST ( ) Result Date: 04/26/2023 CLINICAL DATA:  Mental status change.  History of CNS lymphoma. EXAM: CT HEAD WITHOUT CONTRAST TECHNIQUE: Contiguous axial images were obtained from the base of the skull through the vertex without intravenous contrast. RADIATION DOSE REDUCTION: This exam was performed according to the departmental dose-optimization program which includes automated exposure control, adjustment of the mA and/or kV according to patient size and/or use of iterative reconstruction technique. COMPARISON:  Brain MRI 04/24/2023 FINDINGS: Brain: Cortical edema at the right insula correlating with abnormality on prior brain MRI. Small area of blood products in the parasagittal right parietal lobe with adjacent swelling, operative site. No evidence of interval infarct, hydrocephalus, or collection. Vascular: No hyperdense vessel or unexpected calcification. Skull: Unremarkable right parietal craniotomy site Sinuses/Orbits: Negative IMPRESSION: No acute finding when compared to 04/24/2023 brain MRI. Electronically Signed   By: Tiburcio Pea M.D.   On: 04/26/2023 05:09   CT SOFT TISSUE NECK W CONTRAST Result Date: 04/26/2023 CLINICAL DATA:  Soft tissue infection suspected. Neck swelling and airway compromise EXAM: CT NECK WITH CONTRAST TECHNIQUE: Multidetector CT imaging of the neck was performed using the standard protocol following the bolus administration of intravenous contrast. RADIATION DOSE REDUCTION: This exam was performed according to the departmental dose-optimization program which includes automated exposure control, adjustment of the mA and/or kV according to patient size and/or use of  iterative reconstruction technique. CONTRAST:  OMNIPAQUE IOHEXOL 300 MG/ML  SOLN COMPARISON:  04/16/2023 head CT FINDINGS: Pharynx and larynx: The airway is collapsed around endotracheal tube and enteric tube with fluid layering in the nasopharynx. This limits assessment of the mucosal surfaces, no evidence of submucosal edema. Salivary glands: No inflammation, mass, or stone. Thyroid: Normal. Lymph nodes: None enlarged or heterogeneous. Vascular: No acute finding Limited intracranial: Negative Visualized orbits: Negative Mastoids and visualized paranasal sinuses: Clear Skeleton: No acute or aggressive finding. Upper chest: Clear apical lungs. IMPRESSION: No acute finding. Assessment of mucosal surfaces limited by airway collapse in the setting of intubation. Electronically Signed   By: Tiburcio Pea M.D.   On: 04/26/2023 05:03   DG Abd 1 View Result Date: 04/26/2023 CLINICAL DATA:  Check gastric catheter placement EXAM: ABDOMEN - 1 VIEW COMPARISON:  None Available. FINDINGS: Weighted feeding catheter is noted in the distal stomach directed towards the pole or is. No free air is seen. No obstructive changes are noted. IMPRESSION: Feeding catheter as described. Electronically Signed   By: Alcide Clever M.D.   On: 04/26/2023 02:11   DG CHEST PORT 1 VIEW Result Date: 04/25/2023 CLINICAL DATA:  Check endotracheal tube placement EXAM: PORTABLE CHEST 1 VIEW COMPARISON:  Film from earlier in the same day. FINDINGS: Endotracheal tube is noted 5.3 cm above the carina. Gastric catheter extends into the stomach. Right chest wall port is again seen. Cardiac shadow is stable. No focal infiltrate is noted. Mild basilar atelectasis is seen. IMPRESSION: Tubes and lines as described. Electronically Signed  By: Alcide Clever M.D.   On: 04/25/2023 21:44   DG Chest Port 1 View Result Date: 04/25/2023 CLINICAL DATA:  Altered mental status EXAM: PORTABLE CHEST 1 VIEW COMPARISON:  04/10/2023 FINDINGS: Cardiac shadow is  enlarged but stable. Right chest wall port is again seen. Mild basilar atelectasis is seen. No bony abnormality is noted. IMPRESSION: Mild basilar atelectasis. Electronically Signed   By: Alcide Clever M.D.   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 History of B-cell lymphoma EXAM: MRI HEAD WITHOUT AND WITH CONTRAST TECHNIQUE: Multiplanar, multiecho pulse sequences of the brain and surrounding structures were obtained without and with intravenous contrast. CONTRAST:  8mL GADAVIST GADOBUTROL 1 MMOL/ML IV SOLN COMPARISON:  Comparison made with previous MRI from 04/07/2023 as well as earlier studies. FINDINGS: Brain: Postoperative changes from prior right parietal craniotomy again seen. 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 Size of the lesion is markedly decreased from prior, now measuring 2.9 x 3.5 x 2.3 cm (series 18, image 10). Superimposed susceptibility artifact consistent with blood products. Persistent but improved vasogenic edema within the adjacent posterior right cerebral hemisphere, with improved/resolved mass effect on the right lateral ventricle. Previously seen right to left shift has resolved. Findings consistent with interval response to therapy. Now seen is restricted diffusion involving the right insular cortex and overlying posterior right frontal region (series 5, images 26, 30). Associated gyral swelling with T2/FLAIR signal abnormality within this location (series 9, image 28). No associated enhancement. 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 No other mass lesion or abnormal enhancement. No other acute or subacute infarct. No hydrocephalus or extra-axial fluid collection. Underlying cerebral white matter disease noted, stable. Vascular: Major intracranial vascular flow voids are maintained. Skull and upper cervical spine:  Craniocervical junction within normal limits. Bone marrow signal intensity within normal limits. Post craniotomy changes at the right parietal calvarium without adverse features. Sinuses/Orbits: Globes and orbital soft tissues within normal limits. Mild mucosal thickening present about the ethmoidal air cells and maxillary sinuses. No mastoid effusion. Other: None. IMPRESSION: 1. Marked interval decrease in size of right parietal mass/lymphoma, now measuring 2.9 x 3.5 x 2.3 cm (previously 5.4 x 3.6 x 4.4 cm), consistent with interval response to therapy. Persistent but improved vasogenic edema within the adjacent posterior right cerebral hemisphere, with interval resolution of previously seen right-to-left midline shift. 2. 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 Correlation with symptomatology and EEG recommended. Evolving changes of subacute ischemia would be the primary differential consideration, but are felt to be less likely. Electronically Signed   By: Rise Mu M.D.   On: 04/25/2023 05:24    Labs:  CBC: Recent Labs    05/11/23 0500 05/12/23 1017 05/15/23 0718 05/16/23 0521  WBC 7.4 7.7 10.0 7.9  HGB 11.8* 12.0* 11.4* 10.9*  HCT 35.3* 36.5* 34.3* 33.1*  PLT 267 284 237 228    COAGS: Recent Labs    07/29/22 1520 04/07/23 1016  INR 1.5* 1.2  APTT 28  --     BMP: Recent Labs    05/11/23 0500 05/12/23 1017 05/15/23 0718 05/16/23 0521  NA 134* 136 135 134*  K 4.2 4.3 4.1 4.1  CL 98 98 101 101  CO2 29 27 27 28   GLUCOSE 129* 105* 125* 133*  BUN 22 21  23 21  CALCIUM 8.4* 8.6* 8.4* 8.0*  CREATININE 0.73 0.71 0.73 0.68  GFRNONAA >60 >60 >60 >60    LIVER FUNCTION TESTS: Recent Labs    05/08/23 0440 05/09/23 0407 05/12/23 1017 05/15/23 0718 05/16/23 0521  BILITOT 0.4 0.4 0.6 0.4  --   AST 23 29 34 24  --   ALT 38 43 77* 59*  --   ALKPHOS 52 56 66 60  --    PROT 5.4* 5.2* 5.6* 5.2*  --   ALBUMIN 2.2* 2.1* 2.7* 2.4* 2.4*    TUMOR MARKERS: No results for input(s): "AFPTM", "CEA", "CA199", "CHROMGRNA" in the last 8760 hours.  Assessment and Plan:  Pt is with high grade B cell lymphoma, possible aspiration pneumonia, acute ischemic stroke, and dysphagia image guided percutaneous gastrostomy tube placement scheduled for 05/20/23.  Imaging reviewed and approved by Dr. Milford Cage 05/17/23.    Risks and benefits image guided gastrostomy tube placement was discussed with the patient and daughter Sherron Flemings including, but not limited to the need for a barium enema during the procedure, bleeding, infection, peritonitis and/or damage to adjacent structures.  All of the patient and daughter questions were answered, patient is agreeable to proceed.  Consent obtained via phone call with Sherron Flemings 05/19/23 10:47 am.    Consent signed and in IR.  Pt is currently on aspirin that cannot be held per Neurology - OK to move forward with gastrostomy tube placement 05/20/23 per Dr. Lowella Dandy.  Thank you for allowing our service to participate in Osei Anger 's care.  Electronically Signed: Loman Brooklyn, PA-C   05/19/2023, 10:51 AM    I spent a total of 55 Mi[place] in face to face in clinical consultation, greater than 50% of which was counseling/coordinating care for image guided percutaneous gastrostomy tube placement.

## 2023-05-19 NOTE — Progress Notes (Signed)
 Physical Therapy Treatment Patient Details Name: Tyler [name] MRN: [id number] DOB: [birth year] Today's Date: 05/19/2023   History of Present Illness 73 yo male presents on 2/25 for second cycle of high dose methotrexate with leucovorin rescue. 2/27 MRI showed marked interval decrease in size of right parietal mass/lymphoma and new diffusion signal abnormality involving the right insular cortex and overlying right frontal lobe, favored to reflect sequelae of seizure. 2/28 transferred to ICU and intubated. 3/2 extubated. 3/8 with seizures and need for airway protection so re-intubated and continued to have breakthrough seizures; extubated 3/14. EEG with evidence of epileptogenicity arising from R hemisphere, maximal R parietal region and cortical dysfunction in R hemisphere. PMH: CNS relapse of large B-cell lymphoma, partial seizure, paroxysmal atrial fib not on anticoagulation due to bleeding risk, prostate cancer status post prostatectomy 2021, pulmonary embolism.    PT Comments  Patient with progress at EOB due to quickly fatigued and continued difficulty with problem solving and command following.  He resists using R hand for wiping his face with hand over hand though will follow commands eventually for raising the R arm. He will benefit from continued skilled PT in the acute setting and from follow up inpatient rehab at d/c.     If plan is discharge home, recommend the following: A lot of help with walking and/or transfers;A lot of help with bathing/dressing/bathroom;Assistance with cooking/housework;Assistance with feeding;Direct supervision/assist for medications management;Direct supervision/assist for financial management;Assist for transportation;Help with stairs or ramp for entrance   Can travel by private vehicle     No  Equipment Recommendations  Other (comment) (TBA)    Recommendations for Other Services       Precautions / Restrictions Precautions Precautions: Fall;Other  (comment) Recall of Precautions/Restrictions: Impaired Precaution/Restrictions Comments: cortrak     Mobility  Bed Mobility Overal bed mobility: Needs Assistance Bed Mobility: Supine to Sit, Sit to Supine     Supine to sit: +2 for physical assistance, HOB elevated, Total assist Sit to supine: +2 for physical assistance, Total assist   General bed mobility comments: heavy lifting help for trunk and pt initiated moving R leg off EOB though did not continue; to supine assist for legs up and scooting shoulders to middle and scooting up in bed    Transfers                   General transfer comment: pt declined to attempt to stand and indicated too fatigued    Ambulation/Gait                   Stairs             Wheelchair Mobility     Tilt Bed    Modified Rankin (Stroke Patients Only) Modified Rankin (Stroke Patients Only) Pre-Morbid Rankin Score: Moderate disability Modified Rankin: Severe disability     Balance Overall balance assessment: Needs assistance   Sitting balance-Leahy Scale: Poor Sitting balance - Comments: mod A for balance at EOB and difficulty with anterior weight shift and for leaning R; at EOB about 8 minutes working to get pt to reach to touch targets                                    Communication Communication Communication: Impaired Factors Affecting Communication: Difficulty expressing self;Reduced clarity of speech;Other (comment)  Cognition Arousal: Alert Behavior During Therapy: Flat affect   PT - Cognitive impairments: Difficult  to assess Difficult to assess due to: Impaired communication, Non-English speaking                     PT - Cognition Comments: at times resistive to commands (pushing away when trying to have him wipe his mouth and initially restitive to lift his arm, though finally with repeated cues and gestures.attempted to speak through interpreter twice, though interpreter  unable to understand due to garbled speech and wet vocal quality Following commands: Impaired Following commands impaired: Follows one step commands inconsistently, Follows one step commands with increased time    Cueing Cueing Techniques: Verbal cues, Gestural cues, Visual cues  Exercises Other Exercises Other Exercises: PROM ankles with stretch to L plantarflexors and heel slides with cues for pt to push against therapist with L    General Comments General comments (skin integrity, edema, etc.): continues with copious secretions and encouraged throughout to clear throat and cough, needing suctioning for mouth secretions throughout      Pertinent Vitals/Pain Pain Assessment Pain Assessment: Faces Faces Pain Scale: Hurts little more Pain Location: points to abdomen Pain Descriptors / Indicators: Discomfort, Grimacing Pain Intervention(s): Monitored during session, Repositioned    Home Living                          Prior Function            PT Goals (current goals can now be found in the care plan section) Progress towards PT goals: Progressing toward goals    Frequency    Min 3X/week      PT Plan      Co-evaluation              AM-PAC PT "6 Clicks" Mobility   Outcome Measure  Help needed turning from your back to your side while in a flat bed without using bedrails?: Total Help needed moving from lying on your back to sitting on the side of a flat bed without using bedrails?: Total Help needed moving to and from a bed to a chair (including a wheelchair)?: Total Help needed standing up from a chair using your arms (e.g., wheelchair or bedside chair)?: Total Help needed to walk in hospital room?: Total Help needed climbing 3-5 steps with a railing? : Total 6 Click Score: 6    End of Session Equipment Utilized During Treatment: Gait belt Activity Tolerance: Patient limited by fatigue Patient left: in bed;with call bell/phone within reach;with  bed alarm set (bed in chair position)   PT Visit Diagnosis: Other abnormalities of gait and mobility (R26.89);Other symptoms and signs involving the nervous system (R29.898);Muscle weakness (generalized) (M62.81)     Time: 1610-9604 PT Time Calculation (min) (ACUTE ONLY): 27 min  Charges:    $Therapeutic Activity: 23-37 mins PT General Charges $$ ACUTE PT VISIT: 1 Visit                     Sheran Lawless, PT Acute Rehabilitation Services Office:[phone] 05/19/2023    Elray Mcgregor 05/19/2023, 4:12 PM

## 2023-05-19 NOTE — Plan of Care (Signed)
  Problem: Education: Goal: Knowledge of General Education information will improve Description: Including pain rating scale, medication(s)/side effects and non-pharmacologic comfort measures Outcome: Progressing   Problem: Clinical Measurements: Goal: Will remain free from infection Outcome: Progressing   Problem: Clinical Measurements: Goal: Respiratory complications will improve Outcome: Progressing   Problem: Nutrition: Goal: Adequate nutrition will be maintained Outcome: Progressing   Problem: Coping: Goal: Level of anxiety will decrease Outcome: Progressing   Problem: Elimination: Goal: Will not experience complications related to bowel motility Outcome: Progressing   Problem: Safety: Goal: Ability to remain free from injury will improve Outcome: Progressing

## 2023-05-19 NOTE — Progress Notes (Signed)
 Foley catheter per MD order for acute urinary retention.  14 french catheter inserted using sterile technique.  750 cc amber, clear urine returned.  Catheter secured with a Stat-loc securement device.

## 2023-05-20 ENCOUNTER — Other Ambulatory Visit: Payer: Self-pay

## 2023-05-20 DIAGNOSIS — Z515 Encounter for palliative care: Secondary | ICD-10-CM | POA: Diagnosis not present

## 2023-05-20 DIAGNOSIS — C8339 Primary central nervous system lymphoma: Secondary | ICD-10-CM | POA: Diagnosis not present

## 2023-05-20 DIAGNOSIS — R569 Unspecified convulsions: Secondary | ICD-10-CM | POA: Diagnosis not present

## 2023-05-20 DIAGNOSIS — C8338 Diffuse large B-cell lymphoma, lymph nodes of multiple sites: Secondary | ICD-10-CM | POA: Diagnosis not present

## 2023-05-20 DIAGNOSIS — Z7189 Other specified counseling: Secondary | ICD-10-CM | POA: Diagnosis not present

## 2023-05-20 LAB — GLUCOSE, CAPILLARY
Glucose-Capillary: 106 mg/dL — ABNORMAL HIGH (ref 70–99)
Glucose-Capillary: 118 mg/dL — ABNORMAL HIGH (ref 70–99)
Glucose-Capillary: 130 mg/dL — ABNORMAL HIGH (ref 70–99)
Glucose-Capillary: 144 mg/dL — ABNORMAL HIGH (ref 70–99)
Glucose-Capillary: 96 mg/dL (ref 70–99)
Glucose-Capillary: 97 mg/dL (ref 70–99)
Glucose-Capillary: 98 mg/dL (ref 70–99)

## 2023-05-20 LAB — CBC
HCT: 34.4 % — ABNORMAL LOW (ref 39.0–52.0)
Hemoglobin: 11.6 g/dL — ABNORMAL LOW (ref 13.0–17.0)
MCH: 29.9 pg (ref 26.0–34.0)
MCHC: 33.7 g/dL (ref 30.0–36.0)
MCV: 88.7 fL (ref 80.0–100.0)
Platelets: 201 10*3/uL (ref 150–400)
RBC: 3.88 MIL/uL — ABNORMAL LOW (ref 4.22–5.81)
RDW: 14.1 % (ref 11.5–15.5)
WBC: 6.9 10*3/uL (ref 4.0–10.5)
nRBC: 0.4 % — ABNORMAL HIGH (ref 0.0–0.2)

## 2023-05-20 LAB — COMPREHENSIVE METABOLIC PANEL
ALT: 53 U/L — ABNORMAL HIGH (ref 0–44)
AST: 25 U/L (ref 15–41)
Albumin: 2.4 g/dL — ABNORMAL LOW (ref 3.5–5.0)
Alkaline Phosphatase: 60 U/L (ref 38–126)
Anion gap: 6 (ref 5–15)
BUN: 26 mg/dL — ABNORMAL HIGH (ref 8–23)
CO2: 26 mmol/L (ref 22–32)
Calcium: 8.3 mg/dL — ABNORMAL LOW (ref 8.9–10.3)
Chloride: 105 mmol/L (ref 98–111)
Creatinine, Ser: 0.75 mg/dL (ref 0.61–1.24)
GFR, Estimated: >60 mL/min (ref >60)
Glucose, Bld: 99 mg/dL (ref 70–99)
Potassium: 4 mmol/L (ref 3.5–5.1)
Sodium: 137 mmol/L (ref 135–145)
Total Bilirubin: 0.6 mg/dL (ref 0.0–1.2)
Total Protein: 5.1 g/dL — ABNORMAL LOW (ref 6.5–8.1)

## 2023-05-20 LAB — PROTIME-INR
INR: 1.2 (ref 0.8–1.2)
Prothrombin Time: 15.6 s — ABNORMAL HIGH (ref 11.4–15.2)

## 2023-05-20 LAB — MAGNESIUM: Magnesium: 1.9 mg/dL (ref 1.7–2.4)

## 2023-05-20 LAB — PHOSPHORUS: Phosphorus: 4.8 mg/dL — ABNORMAL HIGH (ref 2.5–4.6)

## 2023-05-20 MED ORDER — NALOXONE HCL 0.4 MG/ML IJ SOLN
0.4000 mg | INTRAMUSCULAR | Status: DC | PRN
Start: 2023-05-20 — End: 2023-05-23

## 2023-05-20 MED ORDER — ENOXAPARIN SODIUM 40 MG/0.4ML IJ SOSY: 40.0000 mg | PREFILLED_SYRINGE | INTRAMUSCULAR | Status: DC

## 2023-05-20 MED ORDER — HYDROMORPHONE HCL 1 MG/ML IJ SOLN
0.5000 mg | INTRAMUSCULAR | Status: DC | PRN
  Administered 2023-05-20 (×2): 0.5 mg via INTRAVENOUS
  Filled 2023-05-20 (×2): qty 0.5

## 2023-05-20 MED ORDER — ENOXAPARIN SODIUM 40 MG/0.4ML IJ SOSY
40.0000 mg | PREFILLED_SYRINGE | INTRAMUSCULAR | Status: DC
  Administered 2023-05-22: 40 mg via SUBCUTANEOUS
  Filled 2023-05-20: qty 0.4

## 2023-05-20 NOTE — Plan of Care (Signed)
  Problem: Clinical Measurements: Goal: Ability to maintain clinical measurements within normal limits will improve Outcome: Progressing Goal: Will remain free from infection Outcome: Progressing Goal: Diagnostic test results will improve Outcome: Progressing Goal: Respiratory complications will improve Outcome: Progressing Goal: Cardiovascular complication will be avoided Outcome: Progressing   Problem: Nutrition: Goal: Adequate nutrition will be maintained Outcome: Progressing   Problem: Coping: Goal: Level of anxiety will decrease Outcome: Progressing   Problem: Elimination: Goal: Will not experience complications related to bowel motility Outcome: Progressing Goal: Will not experience complications related to urinary retention Outcome: Progressing   Problem: Pain Managment: Goal: General experience of comfort will improve and/or be controlled Outcome: Progressing   Problem: Safety: Goal: Ability to remain free from injury will improve Outcome: Progressing   Problem: Skin Integrity: Goal: Risk for impaired skin integrity will decrease Outcome: Progressing   Problem: Bowel/Gastric: Goal: Will not experience complications related to bowel motility Outcome: Progressing   Problem: Nutritional: Goal: Maintenance of adequate nutrition will improve Outcome: Progressing   Problem: Education: Goal: Ability to describe self-care measures that may prevent or decrease complications (Diabetes Survival Skills Education) will improve Outcome: Progressing   Problem: Fluid Volume: Goal: Ability to maintain a balanced intake and output will improve Outcome: Progressing

## 2023-05-20 NOTE — Progress Notes (Signed)
 PROGRESS NOTE  Tyler [name] NWG:[id number] DOB: 1950/03/07   PCP: Nechama Guard, FNP  Patient is from: Home.  DOA: 04/22/2023 LOS: 28  Chief complaints No chief complaint on file.    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  Patient developed neck swelling, drooling, difficulty swallowing and altered mental status with concern for partial seizure.  Pulmonology consulted on 2/28.  He was intubated and admitted to ICU.  Core track placed on 3/1.  MRI brain without contrast on 3/11 showed resolving diffusion abnormality within the posterior right MCA territory and increased extent of hyperintense T2 weighted signal in right hemisphere concerning for progression of lymphoma or confluence of postischemic/postictal edema and lymphoma related vasogenic edema.  Eventually extubated on 3/2 but continued to have focal seizure.  He was reintubated and finally extubated on 3/14 again.  He was transferred to hospitalist service on 3/17.  Oncology and neurology involved but signed off.  Palliative medicine following.  IR consulted for G-tube.  Subjective: Seen and examined earlier this morning.  No major events overnight of this morning.  Not able to vocalize due to severe aphasia/dysarthria.  He follows commands.  He is oriented to self, place and time.  Able to tell me third month using his fingers.  Objective: Vitals:   05/20/23 0437 05/20/23 0500 05/20/23 0802 05/20/23 1146  BP: 126/81  132/83 (!) 145/88  Pulse: 65  72 70  Resp: 18  17 18   Temp: 97.8 F  (36.6 C)  98.4 F (36.9 C) 98.2 F (36.8 C)  TempSrc:   Axillary Oral  SpO2: 94%  97% 100%  Weight:  81 kg    Height:        Examination:  GENERAL: No apparent distress.  Nontoxic. HEENT: MMM.  Vision and hearing grossly intact.  NECK: Supple.  No apparent JVD.  RESP:  No IWOB.  Fair aeration bilaterally. CVS:  RRR. Heart sounds normal.  ABD/GI/GU: BS+. Abd soft, NTND.  MSK/EXT:  Moves right extremities.  Cannot move left extremities. No edema.  SKIN: no apparent skin lesion or wound NEURO: Awake and alert.  Oriented appropriately.  Very dysarthric and aphasic.   Left hemiparesis.  Not able to move his left arm or left leg.  Left facial droop. Drooling.  PSYCH: Somewhat anxious  Consultants:  Hematology/oncology admitted patient Critical care Neurology Radiation oncology Palliative medicine  Procedures: Intubation and mechanical ventilation.  Microbiology summarized: 3/1-MRSA PCR screen nonreactive. 3/1-respiratory culture negative 3/1-a 20 pathogen RVP nonreactive 3/1-blood cultures NGTD 3/11-respiratory culture with abundant Pseudomonas aeruginosa 3/13-blood cultures NGTD   Assessment and plan: Acute ischemic stroke: MRI on 3/11 showed resolving diffusion abnormality within the posterior right MCA territory, and increased extent of hyperintense T2 weighted signal in the right hemisphere concerning for progression of lymphoma or postischemic edema.  CT angio head and neck without LVO or high-grade stenosis.  TTE on 3/1 without significant finding.  LDL 97.  A1c 5.2% on 2/12. -Appreciated by neurology-continue aspirin 81 mg daily -PT, OT, SLP and telemetry  Focal seizure secondary to cerebral edema: LTM EEG on 3/3 with clinical signs  of seizures arising from right parietal region and evidence of epileptogenicity and cortical dysfunction arising from the right hemisphere.  Repeat LTM EEG on 3/20 with no seizure but evidence of epileptogenicity and cortisol dysfunction  arising from right hemisphere. -On Vimpat, Keppra and gabapentin per neurology. -Continue Decadron -Seizure precaution -Neurology signed off.  High-grade B-cell lymphoma with resection and recent chemo-primarily admitted second cycle of methotrexate and leucovorin -Oncology recommending goal of care discussion. -No clear role of radiation per radiation oncology. -Continue Decadron and Keppra. -Palliative medicine on board.  Dysphagia: Due to the above.  NPO.  Excessive oral secretion and not allowing suctioning. -Continue tube feed via cortrack for almost 4 weeks now -Stopped scopolamine patch.  Start glycopyrrolate 0.5 mg twice daily -Family wants to proceed with G-tube.  IR consulted and will place G-tube on 3/26. -IR asked if aspirin can be hold for for 5 days before G-tube. Neurology, Dr. Pearlean Brownie recommended consulting surgery if IR cannot place G-tube while on aspirin.  Possible aspiration pneumonia: Respiratory culture with Pseudomonas. -Completed IV Zosyn from 3/13-3/18.  Remains high risk.  Paroxysmal atrial fibrillation: Rate controlled without meds.  Not on AC due to bleeding risk. -Optimize electrolytes   Previous PEs LLE DVT: No longer on AC.  Delirium: Has had fluctuating mental status. -Delirium precaution -Continue monitoring  Acute urinary retention: Bladder scan showed 650 cc on 3/24.  Foley inserted and had 750 cc amber clear urine right away.  Has had prostatectomy for prostate cancer. -Continue Foley catheter.  I do not know if he can pass voiding trial since he is bedridden.   Prostate cancer s/p prostatectomy   Hepatitis B -Continue tenofovir 25 daily  Advance care planning: Had extensive discussion with patient's daughter, Benay Spice over the phone on 3/24.  Erene a Publishing rights manager.  She understands patient's poor prognosis and poor quality of life.  She also understand that comfort care is to his best interest.  Unfortunately, she is not able to make this  decision by herself since the rest of her family including his other children in [place] are not on board. Per Benay Spice, they are not even ready to avail themselves for video chat on this. -Proceed with G-tube. -Continue full code -Palliative medicine on board   Class I obesity Body mass index is 27.96 kg/m. Nutrition Problem: Inadequate oral intake Etiology:  (seizures) Signs/Symptoms: NPO status Interventions: Tube feeding, Prostat   DVT prophylaxis:  enoxaparin (LOVENOX) injection 40 mg Start: 05/22/23 1000 SCDs Start: 04/25/23 2209  Code Status: Full code Family Communication: Updated patient's daughter, Benay Spice over the phone. Level of care: Progressive Status is: Inpatient Remains inpatient appropriate because: Seizure, diffuse B-cell lymphoma, acute CVA and dysphagia   Final disposition: To be determined   55 minutes with more than 50% spent in reviewing records, counseling patient/family and coordinating care.   Sch Meds:  Scheduled Meds:  aspirin  81 mg Per Tube Daily   Chlorhexidine Gluconate Cloth  6 each Topical Nightly   cholecalciferol  2,000 Units Per Tube Daily   vitamin B-12  1,000 mcg Per Tube Daily   dexamethasone  4 mg Per Tube Q12H   [START ON 05/22/2023] enoxaparin (LOVENOX) injection  40 mg Subcutaneous Q24H   feeding supplement (PROSource TF20)  60 mL Per Tube BID   fiber supplement (BANATROL TF)  60 mL Per Tube TID   folic acid  1 mg Per Tube Daily   gabapentin  100 mg Per Tube Q8H   glycopyrrolate  0.5  mg Per Tube BID   insulin aspart  0-15 Units Subcutaneous Q4H   lacosamide  200 mg Per Tube BID   levETIRAcetam  2,000 mg Per Tube BID   metoCLOPramide (REGLAN) injection  5 mg Intravenous Q12H   sodium chloride flush  10-40 mL Intracatheter Q12H   sodium chloride flush  3-10 mL Intravenous Q12H   tenofovir alafenamide  25 mg Per Tube Daily   thiamine (VITAMIN B1) injection  100 mg Intravenous Daily   Continuous Infusions:  feeding supplement  (OSMOLITE 1.5 CAL) Stopped (05/19/23 1054)   PRN Meds:.acetaminophen, alum & mag hydroxide-simeth, hydrALAZINE, hydrocortisone, ibuprofen, lidocaine, LORazepam, magic mouthwash w/lidocaine, ondansetron (ZOFRAN) IV, mouth rinse, polyethylene glycol, senna-docusate, sodium chloride flush, sodium chloride flush  Antimicrobials: Anti-infectives (From admission, onward)    Start     Dose/Rate Route Frequency Ordered Stop   05/09/23 1000  tenofovir alafenamide (VEMLIDY) tablet 25 mg        25 mg Per Tube Daily 05/08/23 1135     05/08/23 1245  piperacillin-tazobactam (ZOSYN) IVPB 3.375 g        3.375 g 12.5 mL/hr over 240 Minutes Intravenous Every 8 hours 05/08/23 1153 05/13/23 0955   05/08/23 1000  tenofovir (VIREAD) tablet 300 mg  Status:  Discontinued        300 mg Per Tube Daily 05/07/23 1143 05/08/23 1135   05/07/23 2300  vancomycin (VANCOCIN) IVPB 1000 mg/200 mL premix  Status:  Discontinued        1,000 mg 200 mL/hr over 60 Minutes Intravenous Every 12 hours 05/07/23 1218 05/08/23 1401   05/07/23 1015  vancomycin (VANCOREADY) IVPB 1750 mg/350 mL        1,750 mg 175 mL/hr over 120 Minutes Intravenous  Once 05/07/23 0921 05/07/23 1236   05/07/23 1015  ceFEPIme (MAXIPIME) 2 g in sodium chloride 0.9 % 100 mL IVPB  Status:  Discontinued        2 g 200 mL/hr over 30 Minutes Intravenous Every 8 hours 05/07/23 0921 05/08/23 1153   04/28/23 1000  tenofovir alafenamide (VEMLIDY) tablet 25 mg  Status:  Discontinued        25 mg Per Tube Daily 04/27/23 2309 05/07/23 1143   04/23/23 1000  tenofovir alafenamide (VEMLIDY) tablet 25 mg  Status:  Discontinued        25 mg Oral Daily 04/22/23 1520 04/27/23 2309        I have personally reviewed the following labs and images: CBC: Recent Labs  Lab 05/15/23 0718 05/16/23 0521 05/20/23 0535  WBC 10.0 7.9 6.9  NEUTROABS 7.3  --   --   HGB 11.4* 10.9* 11.6*  HCT 34.3* 33.1* 34.4*  MCV 91.2 89.9 88.7  PLT 237 228 201   BMP &GFR Recent Labs   Lab 05/15/23 0718 05/16/23 0521 05/20/23 0535  NA 135 134* 137  K 4.1 4.1 4.0  CL 101 101 105  CO2 27 28 26   GLUCOSE 125* 133* 99  BUN 23 21 26*  CREATININE 0.73 0.68 0.75  CALCIUM 8.4* 8.0* 8.3*  MG 1.9 1.8 1.9  PHOS 4.4 4.1 4.8*   Estimated Creatinine Clearance: 86.4 mL/min (by C-G formula based on SCr of 0.75 mg/dL). Liver & Pancreas: Recent Labs  Lab 05/15/23 0718 05/16/23 0521 05/20/23 0535  AST 24  --  25  ALT 59*  --  53*  ALKPHOS 60  --  60  BILITOT 0.4  --  0.6  PROT 5.2*  --  5.1*  ALBUMIN  2.4* 2.4* 2.4*   No results for input(s): "LIPASE", "AMYLASE" in the last 168 hours. No results for input(s): "AMMONIA" in the last 168 hours. Diabetic: No results for input(s): "HGBA1C" in the last 72 hours. Recent Labs  Lab 05/19/23 2037 05/20/23 0002 05/20/23 0438 05/20/23 0759 05/20/23 1142  GLUCAP 94 98 97 96 106*   Cardiac Enzymes: No results for input(s): "CKTOTAL", "CKMB", "CKMBINDEX", "TROPONINI" in the last 168 hours. No results for input(s): "PROBNP" in the last 8760 hours. Coagulation Profile: Recent Labs  Lab 05/20/23 0905  INR 1.2   Thyroid Function Tests: No results for input(s): "TSH", "T4TOTAL", "FREET4", "T3FREE", "THYROIDAB" in the last 72 hours. Lipid Profile: No results for input(s): "CHOL", "HDL", "LDLCALC", "TRIG", "CHOLHDL", "LDLDIRECT" in the last 72 hours.  Anemia Panel: No results for input(s): "VITAMINB12", "FOLATE", "FERRITIN", "TIBC", "IRON", "RETICCTPCT" in the last 72 hours. Urine analysis:    Component Value Date/Time   COLORURINE YELLOW 05/08/2023 1632   APPEARANCEUR HAZY (A) 05/08/2023 1632   LABSPEC >1.046 (H) 05/08/2023 1632   PHURINE 6.0 05/08/2023 1632   GLUCOSEU NEGATIVE 05/08/2023 1632   HGBUR NEGATIVE 05/08/2023 1632   BILIRUBINUR NEGATIVE 05/08/2023 1632   KETONESUR NEGATIVE 05/08/2023 1632   PROTEINUR NEGATIVE 05/08/2023 1632   NITRITE NEGATIVE 05/08/2023 1632   LEUKOCYTESUR NEGATIVE 05/08/2023 1632    Sepsis Labs: Invalid input(s): "PROCALCITONIN", "LACTICIDVEN"  Microbiology: No results found for this or any previous visit (from the past 240 hours).   Radiology Studies: No results found.      Shaleena Crusoe T. Son Barkan [facility]  If 7PM-7AM, please contact night-coverage [email or web address] 05/20/2023, 1:40 PM

## 2023-05-20 NOTE — Progress Notes (Signed)
   Palliative Medicine Inpatient Follow Up Note  HPI: 73 year old male Prostate cancer with prostatectomy 2021, bilateral PE after surgery at that time. Lumbosacral stenosis and lower back pain manage conservatively. Atrial fibrillation not on anticoagulation secondary to high bleeding risk. Chronic hepatitis B tenofovir. 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 Colonel Bald completed 6 cycles of R-CHOP with remission of the parenchymal lesion receiving high-dose methotrexate cycle 2 to start. Patient was readmitted on 2/25 for second cycle of methotrexate/leucovorin and was noted to have some left-sided weakness as well as partial sensory seizures and was on Keppra from the prior admission.   Palliative care has been asked to get involved to support additional goals of care conversations.   Today's Discussion 05/20/2023  *Please note that this is a verbal dictation therefore any spelling or grammatical errors are due to the "[employer] One" system interpretation.  Chart reviewed inclusive of vital signs, progress notes, laboratory results, and diagnostic images.   Patients family have elected to proceed with G-tube placement.   (-) staff concerns this afternoon.   I met with Tyler [name] at bedside this afternoon. He opens his eyes and places two fingers up on his right side. I attempted to gain insights on what he was trying to share though was unable to.   I shared with Tyler [name] the plan for G-tube placement later today. He was able to nod yes.   Questions and concerns addressed/Palliative Support Provided.   Objective Assessment: Vital Signs Vitals:   05/20/23 0802 05/20/23 1146  BP: 132/83 (!) 145/88  Pulse: 72 70  Resp: 17 18  Temp: 98.4 F (36.9 C) 98.2 F (36.8 C)  SpO2: 97% 100%    Intake/Output Summary (Last 24 hours) at  05/20/2023 1254 Last data filed at 05/20/2023 1100 Gross per 24 hour  Intake 140 ml  Output 1450 ml  Net -1310 ml   Last Weight  Most recent update: 05/20/2023  5:59 AM    Weight  81 kg (178 lb 9.2 oz)            Gen: Elderly African-American male chronically ill-appearing HEENT: Core track, moist mucous membranes CV: Regular rate and rhythm  PULM: On RA, breathing is unlabored ABD: soft/nontender  EXT: Generalized edema  Neuro: Awake, tries to speak, able to track  SUMMARY OF RECOMMENDATIONS   Full Code / Full scope of care   G-Tube is going to be placed  today   The PMT will remain peripherally involved   MDM:Low ______________________________________________________________________________________ Tyler [name] Palliative Medicine Team Team Cell Phone: [phone] Please utilize secure chat with additional questions, if there is no response within 30 minutes please call the above phone number  Palliative Medicine Team providers are available by phone from 7am to 7pm daily and can be reached through the team cell phone.  Should this patient require assistance outside of these hours, please call the patient's attending physician.

## 2023-05-20 NOTE — Progress Notes (Signed)
 [facility] night cross cover note:   I was notified by RN that the patient is complaining of some abdominal discomfort that is refractory to prn acetaminophen per existing order, and that he is requesting some additional pain medication to help further address his abdominal discomfort.  I subsequently added prn IV Dilaudid for pain.     Newton Pigg, DO Hospitalist

## 2023-05-20 NOTE — Plan of Care (Signed)
  Problem: Clinical Measurements: Goal: Ability to maintain clinical measurements within normal limits will improve Outcome: Progressing Goal: Will remain free from infection Outcome: Progressing Goal: Diagnostic test results will improve Outcome: Progressing Goal: Respiratory complications will improve Outcome: Progressing Goal: Cardiovascular complication will be avoided Outcome: Progressing   Problem: Elimination: Goal: Will not experience complications related to bowel motility Outcome: Progressing Goal: Will not experience complications related to urinary retention Outcome: Progressing   Problem: Safety: Goal: Ability to remain free from injury will improve Outcome: Progressing   Problem: Skin Integrity: Goal: Risk for impaired skin integrity will decrease Outcome: Progressing   Problem: Pain Managment: Goal: General experience of comfort will improve and/or be controlled Outcome: Progressing   Problem: Coping: Goal: Ability to identify and develop effective coping behavior will improve Outcome: Progressing

## 2023-05-20 NOTE — Progress Notes (Signed)
 Occupational Therapy Treatment Patient Details Name: Tyler [name] MRN: [id number] DOB: 03/30/1950 Today's Date: 05/20/2023   History of present illness 73 yo male presents on 2/25 for second cycle of high dose methotrexate with leucovorin rescue. 2/27 MRI showed marked interval decrease in size of right parietal mass/lymphoma and new diffusion signal abnormality involving the right insular cortex and overlying right frontal lobe, favored to reflect sequelae of seizure. 2/28 transferred to ICU and intubated. 3/2 extubated. 3/8 with seizures and need for airway protection so re-intubated and continued to have breakthrough seizures; extubated 3/14. EEG with evidence of epileptogenicity arising from R hemisphere, maximal R parietal region and cortical dysfunction in R hemisphere. PMH: CNS relapse of large B-cell lymphoma, partial seizure, paroxysmal atrial fib not on anticoagulation due to bleeding risk, prostate cancer status post prostatectomy 2021, pulmonary embolism.   OT comments  Pt with limited progression toward established OT goals today. Pt perseverative on wanting to elevate HOB even when sitting at EOB constantly attempting to push button and difficult to redirect even when [facility] all the way up. Pt needing dense total A for bed mobility this session but able to static sit with mod A and intermittent bouts of min A for ~15 mins. Pt needing hand over han A to initiate grooming tasks but was able to perform oral care overall with min A for brushing teeth with set-up. Pt needing increased time and cues to process and follow commands. Will continue to follow acutely. Recommending intensive inpatient rehabilitation <3 hours at dc.       If plan is discharge home, recommend the following:  Assistance with cooking/housework;Assistance with feeding;Assist for transportation;Help with stairs or ramp for entrance;Direct supervision/assist for medications management;Direct supervision/assist for financial  management;Two people to help with walking and/or transfers;Two people to help with bathing/dressing/bathroom   Equipment Recommendations  Other (comment) (defer)    Recommendations for Other Services Rehab consult    Precautions / Restrictions Precautions Precautions: Fall;Other (comment) Recall of Precautions/Restrictions: Impaired Precaution/Restrictions Comments: cortrak Restrictions Weight Bearing Restrictions Per Provider Order: No       Mobility Bed Mobility Overal bed mobility: Needs Assistance Bed Mobility: Supine to Sit, Sit to Supine     Supine to sit: +2 for physical assistance, HOB elevated, Total assist Sit to supine: +2 for physical assistance, Total assist   General bed mobility comments: heavy lifting help for trunk and pt initiated moving R leg off EOB though did not continue; to supine assist for legs up and scooting shoulders to middle and scooting up in bed    Transfers                   General transfer comment: Pt with max attempts to return to supine with offer to stand this date.     Balance Overall balance assessment: Needs assistance Sitting-balance support: Feet supported Sitting balance-Leahy Scale: Poor Sitting balance - Comments: mod A for balance statically with intermittent bouts of min A                                   ADL either performed or assessed with clinical judgement   ADL Overall ADL's : Needs assistance/impaired     Grooming: Wash/dry face;Oral care;Minimal assistance;Total assistance;Sitting Grooming Details (indicate cue type and reason): resistive to all attempts to wash face with head turn away from cloth thus, total A to wash face. pt needing direct simple commands,  incresaed time, and initial min A to initiate brushing teeth but then able to do with support for sitting balance only with full set-up of suction toothbrush provided by OT                                     Extremity/Trunk Assessment Upper Extremity Assessment Upper Extremity Assessment: RUE deficits/detail;LUE deficits/detail RUE Deficits / Details: Generalized weakness with overall RU E strength 4/5; preference for resting with elbow flexion mild tightness with stretching into extension today. LUE Deficits / Details: weak, poor command following on L and no initiation today. PROM functional. LUE Sensation: decreased light touch LUE Coordination: decreased fine motor   Lower Extremity Assessment Lower Extremity Assessment: Defer to PT evaluation        Vision   Vision Assessment?: Vision impaired- to be further tested in functional context Additional Comments: decr visual attention to the L and able to scan to L with compensatory head turn but occular ROM not observed past midline   Perception Perception Perception: Impaired Preception Impairment Details: Inattention/Neglect Perception-Other Comments: L   Praxis     Communication Communication Communication: Impaired Factors Affecting Communication: Difficulty expressing self;Reduced clarity of speech   Cognition Arousal: Alert Behavior During Therapy: Flat affect Cognition: No family/caregiver present to determine baseline             OT - Cognition Comments: needs increased time to follow basic one step commands and repeated cueing at times. Min A for initiation of task after the cueing                 Following commands: Impaired Following commands impaired: Follows one step commands inconsistently, Follows one step commands with increased time      Cueing   Cueing Techniques: Verbal cues, Gestural cues, Visual cues  Exercises Exercises: General Upper Extremity, Other exercises General Exercises - Upper Extremity Shoulder Flexion: PROM, Left, 5 reps Elbow Flexion: PROM, Left, 5 reps Wrist Flexion: PROM, Left, 5 reps Wrist Extension: PROM, Left, 5 reps Digit Composite Flexion: PROM, Left, 5  reps Composite Extension: PROM, Left, 5 reps Other Exercises Other Exercises: lateral lean onto L elbow at EOB to facilitate weight bearing. Other Exercises: reaching 3x with total A with RUE as pt with poor initiation and attention to task    Shoulder Instructions       General Comments VSS    Pertinent Vitals/ Pain       Pain Assessment Pain Assessment: Faces Faces Pain Scale: No hurt Pain Intervention(s): Monitored during session  Home Living                                          Prior Functioning/Environment              Frequency  Min 1X/week        Progress Toward Goals  OT Goals(current goals can now be found in the care plan section)  Progress towards OT goals: Progressing toward goals  Acute Rehab OT Goals Patient Stated Goal: bring HOB up communicated with gestures OT Goal Formulation: Patient unable to participate in goal setting Time For Goal Achievement: 05/24/23 Potential to Achieve Goals: Fair ADL Goals Pt Will Perform Eating: sitting;with supervision Pt Will Perform Grooming: with min assist;sitting Pt Will Perform Lower Body Bathing: sitting/lateral  leans;with min assist;sit to/from stand Pt Will Perform Upper Body Dressing: sitting;with mod assist Pt Will Perform Lower Body Dressing: sit to/from stand;with max assist Pt Will Transfer to Toilet: with mod assist;with +2 assist;stand pivot transfer Pt/caregiver will Perform Home Exercise Program: Increased strength;Both right and left upper extremity;With theraband;With theraputty;With Supervision;With written HEP provided Additional ADL Goal #1: Pt will maintain midline postural control EOB wtih S in preparation for ADL tasks  Plan Discharge plan remains appropriate    Co-evaluation                 AM-PAC OT "6 Clicks" Daily Activity     Outcome Measure   Help from another person eating meals?: Total Help from another person taking care of personal grooming?: A  Lot Help from another person toileting, which includes using toliet, bedpan, or urinal?: Total Help from another person bathing (including washing, rinsing, drying)?: Total Help from another person to put on and taking off regular upper body clothing?: Total Help from another person to put on and taking off regular lower body clothing?: Total 6 Click Score: 7    End of Session    OT Visit Diagnosis: Unsteadiness on feet (R26.81);Other abnormalities of gait and mobility (R26.89);Muscle weakness (generalized) (M62.81);Other symptoms and signs involving cognitive function;Low vision, both eyes (H54.2)   Activity Tolerance Patient tolerated treatment well   Patient Left in bed;with call bell/phone within reach;with bed alarm set   Nurse Communication Mobility status        Time: 1610-9604 OT Time Calculation (min): 20 min  Charges: OT General Charges $OT Visit: 1 Visit OT Treatments $Self Care/Home Management : 8-22 mins  Tyler Deis, OTR/L [facility] Acute Rehabilitation Office: [phone]   Myrla Halsted 05/20/2023, 11:39 AM

## 2023-05-20 NOTE — Progress Notes (Signed)
 SLP Cancellation Note  Patient Details Name: Tyler [name] MRN: [id number] DOB: Dec 21, 1950   Cancelled treatment:       Reason Eval/Treat Not Completed: Medical issues which prohibited therapy. Per chart and discussion with RN, pt NPO this morning pending PEG placement. Will f/u for ongoing dysphagia therapy as able. Continue to recommend reordering SLP cognitive-linguistic evaluation as well.    Mahala Menghini., M.A. CCC-SLP Acute Rehabilitation Services Office [phone]  Secure chat preferred  05/20/2023, 8:29 AM

## 2023-05-21 ENCOUNTER — Inpatient Hospital Stay (HOSPITAL_COMMUNITY)

## 2023-05-21 DIAGNOSIS — Z515 Encounter for palliative care: Secondary | ICD-10-CM | POA: Diagnosis not present

## 2023-05-21 DIAGNOSIS — C8338 Diffuse large B-cell lymphoma, lymph nodes of multiple sites: Secondary | ICD-10-CM | POA: Diagnosis not present

## 2023-05-21 DIAGNOSIS — C8339 Primary central nervous system lymphoma: Secondary | ICD-10-CM | POA: Diagnosis not present

## 2023-05-21 DIAGNOSIS — J690 Pneumonitis due to inhalation of food and vomit: Secondary | ICD-10-CM

## 2023-05-21 DIAGNOSIS — Z7189 Other specified counseling: Secondary | ICD-10-CM | POA: Diagnosis not present

## 2023-05-21 DIAGNOSIS — R569 Unspecified convulsions: Secondary | ICD-10-CM | POA: Diagnosis not present

## 2023-05-21 HISTORY — PX: IR GASTROSTOMY TUBE MOD SED: IMG625

## 2023-05-21 LAB — COMPREHENSIVE METABOLIC PANEL
ALT: 76 U/L — ABNORMAL HIGH (ref 0–44)
AST: 33 U/L (ref 15–41)
Albumin: 2.9 g/dL — ABNORMAL LOW (ref 3.5–5.0)
Alkaline Phosphatase: 78 U/L (ref 38–126)
Anion gap: 11 (ref 5–15)
BUN: 31 mg/dL — ABNORMAL HIGH (ref 8–23)
CO2: 22 mmol/L (ref 22–32)
Calcium: 8.8 mg/dL — ABNORMAL LOW (ref 8.9–10.3)
Chloride: 104 mmol/L (ref 98–111)
Creatinine, Ser: 0.86 mg/dL (ref 0.61–1.24)
GFR, Estimated: >60 mL/min (ref >60)
Glucose, Bld: 118 mg/dL — ABNORMAL HIGH (ref 70–99)
Potassium: 3.8 mmol/L (ref 3.5–5.1)
Sodium: 137 mmol/L (ref 135–145)
Total Bilirubin: 0.7 mg/dL (ref 0.0–1.2)
Total Protein: 6.2 g/dL — ABNORMAL LOW (ref 6.5–8.1)

## 2023-05-21 LAB — CBC
HCT: 39.8 % (ref 39.0–52.0)
Hemoglobin: 13.5 g/dL (ref 13.0–17.0)
MCH: 29.9 pg (ref 26.0–34.0)
MCHC: 33.9 g/dL (ref 30.0–36.0)
MCV: 88.1 fL (ref 80.0–100.0)
Platelets: 217 10*3/uL (ref 150–400)
RBC: 4.52 MIL/uL (ref 4.22–5.81)
RDW: 14.3 % (ref 11.5–15.5)
WBC: 8.4 10*3/uL (ref 4.0–10.5)
nRBC: 1.1 % — ABNORMAL HIGH (ref 0.0–0.2)

## 2023-05-21 LAB — MAGNESIUM: Magnesium: 1.9 mg/dL (ref 1.7–2.4)

## 2023-05-21 LAB — GLUCOSE, CAPILLARY
Glucose-Capillary: 110 mg/dL — ABNORMAL HIGH (ref 70–99)
Glucose-Capillary: 107 mg/dL — ABNORMAL HIGH (ref 70–99)
Glucose-Capillary: 82 mg/dL (ref 70–99)
Glucose-Capillary: 111 mg/dL — ABNORMAL HIGH (ref 70–99)
Glucose-Capillary: 110 mg/dL — ABNORMAL HIGH (ref 70–99)

## 2023-05-21 LAB — PROCALCITONIN: Procalcitonin: <0.1 ng/mL

## 2023-05-21 LAB — PHOSPHORUS: Phosphorus: 5.3 mg/dL — ABNORMAL HIGH (ref 2.5–4.6)

## 2023-05-21 LAB — BRAIN NATRIURETIC PEPTIDE: B Natriuretic Peptide: 70.6 pg/mL (ref 0.0–100.0)

## 2023-05-21 MED ORDER — FENTANYL CITRATE (PF) 100 MCG/2ML IJ SOLN
INTRAMUSCULAR | Status: AC
Start: 2023-05-21 — End: ?
  Filled 2023-05-21: qty 2

## 2023-05-21 MED ORDER — FENTANYL CITRATE (PF) 100 MCG/2ML IJ SOLN
INTRAMUSCULAR | Status: AC | PRN
  Administered 2023-05-21: 25 ug via INTRAVENOUS

## 2023-05-21 MED ORDER — LIDOCAINE HCL (PF) 1 % IJ SOLN
10.0000 mL | Freq: Once | INTRAMUSCULAR | Status: AC
  Administered 2023-05-21: 10 mL

## 2023-05-21 MED ORDER — CEFAZOLIN SODIUM-DEXTROSE 2-4 GM/100ML-% IV SOLN
INTRAVENOUS | Status: AC
  Filled 2023-05-21: qty 100

## 2023-05-21 MED ORDER — SODIUM CHLORIDE 0.9 % IV SOLN
2000.0000 mg | Freq: Two times a day (BID) | INTRAVENOUS | Status: DC
  Administered 2023-05-21 (×2): 2000 mg via INTRAVENOUS
  Filled 2023-05-21 (×2): qty 20

## 2023-05-21 MED ORDER — SODIUM CHLORIDE 0.9 % IV SOLN
INTRAVENOUS | Status: AC | PRN
  Administered 2023-05-21: 10 mL/h via INTRAVENOUS

## 2023-05-21 MED ORDER — GLUCAGON HCL RDNA (DIAGNOSTIC) 1 MG IJ SOLR
INTRAMUSCULAR | Status: AC | PRN
  Administered 2023-05-21: .5 mg via INTRAVENOUS

## 2023-05-21 MED ORDER — MIDAZOLAM HCL 2 MG/2ML IJ SOLN
INTRAMUSCULAR | Status: AC
  Filled 2023-05-21: qty 2

## 2023-05-21 MED ORDER — GLYCOPYRROLATE 0.2 MG/ML IJ SOLN
0.1000 mg | Freq: Two times a day (BID) | INTRAMUSCULAR | Status: DC
  Administered 2023-05-21 – 2023-05-22 (×4): 0.1 mg via INTRAVENOUS
  Filled 2023-05-21 (×4): qty 1

## 2023-05-21 MED ORDER — ASPIRIN 300 MG RE SUPP: 300.0000 mg | Freq: Once | RECTAL | Status: DC

## 2023-05-21 MED ORDER — IOHEXOL 300 MG/ML  SOLN
50.0000 mL | Freq: Once | INTRAMUSCULAR | Status: AC | PRN
Start: 2023-05-21 — End: 2023-05-21
  Administered 2023-05-21: 15 mL

## 2023-05-21 MED ORDER — GLYCOPYRROLATE 1 MG PO TABS: 0.5000 mg | ORAL_TABLET | Freq: Two times a day (BID) | ORAL | Status: DC

## 2023-05-21 MED ORDER — GLUCAGON HCL RDNA (DIAGNOSTIC) 1 MG IJ SOLR
INTRAMUSCULAR | Status: AC
  Filled 2023-05-21: qty 1

## 2023-05-21 MED ORDER — DEXAMETHASONE 4 MG PO TABS
4.0000 mg | ORAL_TABLET | Freq: Two times a day (BID) | ORAL | Status: DC
Start: 2023-05-21 — End: 2023-05-23
  Administered 2023-05-22 (×2): 4 mg
  Filled 2023-05-21 (×2): qty 1

## 2023-05-21 MED ORDER — LIDOCAINE VISCOUS HCL 2 % MT SOLN
15.0000 mL | Freq: Once | OROMUCOSAL | Status: DC
Start: 2023-05-21 — End: 2023-05-23

## 2023-05-21 MED ORDER — LACOSAMIDE 200 MG PO TABS
200.0000 mg | ORAL_TABLET | Freq: Two times a day (BID) | ORAL | Status: DC
  Administered 2023-05-22 (×2): 200 mg
  Filled 2023-05-21 (×2): qty 1

## 2023-05-21 MED ORDER — CEFAZOLIN SODIUM-DEXTROSE 2-4 GM/100ML-% IV SOLN
2.0000 g | Freq: Once | INTRAVENOUS | Status: AC
  Administered 2023-05-21: 2 g via INTRAVENOUS

## 2023-05-21 MED ORDER — DEXAMETHASONE SODIUM PHOSPHATE 4 MG/ML IJ SOLN
4.0000 mg | Freq: Two times a day (BID) | INTRAMUSCULAR | Status: DC
Start: 2023-05-21 — End: 2023-05-23
  Administered 2023-05-21 (×2): 4 mg via INTRAVENOUS
  Filled 2023-05-21 (×3): qty 1

## 2023-05-21 MED ORDER — SODIUM CHLORIDE 0.9 % IV SOLN
200.0000 mg | Freq: Two times a day (BID) | INTRAVENOUS | Status: DC
  Administered 2023-05-21 (×2): 200 mg via INTRAVENOUS
  Filled 2023-05-21 (×5): qty 20

## 2023-05-21 MED ORDER — ASPIRIN 300 MG RE SUPP
300.0000 mg | Freq: Every day | RECTAL | Status: DC
  Administered 2023-05-21: 300 mg via RECTAL
  Filled 2023-05-21: qty 1

## 2023-05-21 MED ORDER — DEXTROSE-SODIUM CHLORIDE 5-0.9 % IV SOLN
INTRAVENOUS | Status: AC
  Administered 2023-05-21: 1000 mL via INTRAVENOUS

## 2023-05-21 MED ORDER — FUROSEMIDE 10 MG/ML IJ SOLN
40.0000 mg | Freq: Once | INTRAMUSCULAR | Status: AC
Start: 2023-05-21 — End: 2023-05-21
  Administered 2023-05-21: 40 mg via INTRAVENOUS
  Filled 2023-05-21: qty 4

## 2023-05-21 MED ORDER — LEVETIRACETAM 100 MG/ML PO SOLN
2000.0000 mg | Freq: Two times a day (BID) | ORAL | Status: DC
  Administered 2023-05-22 (×2): 2000 mg
  Filled 2023-05-21 (×2): qty 20

## 2023-05-21 MED ORDER — LIDOCAINE VISCOUS HCL 2 % MT SOLN
OROMUCOSAL | Status: AC
  Filled 2023-05-21: qty 15

## 2023-05-21 MED ORDER — MORPHINE SULFATE (PF) 2 MG/ML IV SOLN
1.0000 mg | INTRAVENOUS | Status: DC | PRN
  Administered 2023-05-21: 1 mg via INTRAVENOUS
  Filled 2023-05-21: qty 1

## 2023-05-21 MED ORDER — MIDAZOLAM HCL 2 MG/2ML IJ SOLN
INTRAMUSCULAR | Status: AC | PRN
  Administered 2023-05-21: .5 mg via INTRAVENOUS

## 2023-05-21 MED ORDER — MORPHINE SULFATE (PF) 2 MG/ML IV SOLN: 1.0000 mg | INTRAVENOUS | Status: DC | PRN

## 2023-05-21 MED ORDER — ASPIRIN 81 MG PO CHEW
81.0000 mg | CHEWABLE_TABLET | Freq: Every day | ORAL | Status: DC
  Administered 2023-05-22: 81 mg
  Filled 2023-05-21: qty 1

## 2023-05-21 NOTE — Progress Notes (Signed)
 PCCM INTERVAL PROGRESS NOTE   Called to bedside to evaluate patient following vomiting and likely aspiration event. He desaturated requiring non-rebreather and nasotracheal suctioning. He is now weaned to 2L and could likely be weaned off. Mental status is at or very close to his recent baseline per RN. PCCM will re-evaluate in the AM. If he should worsen in the interim please let us know.     Joneen Roach, AGACNP-BC [facility]  See Amion for personal pager PCCM on call pager [phone] until 7pm. Please call Elink 7p-7a. [phone]  05/21/2023 5:39 AM

## 2023-05-21 NOTE — Plan of Care (Signed)
   Problem: Education: Goal: Knowledge of General Education information will improve Description Including pain rating scale, medication(s)/side effects and non-pharmacologic comfort measures Outcome: Progressing

## 2023-05-21 NOTE — Progress Notes (Addendum)
 Patient is redirecting pain towards the abdomen.  Patient had a PEG tube placement today 05/21/2023. Currently on Tylenol which is not much helpful.  Starting IV morphine 1-2 mg every 2 hour as needed for moderate, severe and breakthrough pain.  Tereasa Coop, MD [facility] 05/21/2023, 8:28 PM

## 2023-05-21 NOTE — Progress Notes (Addendum)
 Palliative Medicine Inpatient Follow Up Note  HPI: 73 year old male Prostate cancer with prostatectomy 2021, bilateral PE after surgery at that time. Lumbosacral stenosis and lower back pain manage conservatively. Atrial fibrillation not on anticoagulation secondary to high bleeding risk. Chronic hepatitis B tenofovir. 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 Colonel Bald completed 6 cycles of R-CHOP with remission of the parenchymal lesion receiving high-dose methotrexate cycle 2 to start. Patient was readmitted on 2/25 for second cycle of methotrexate/leucovorin and was noted to have some left-sided weakness as well as partial sensory seizures and was on Keppra from the prior admission.   Palliative care has been asked to get involved to support additional goals of care conversations.   Today's Discussion 05/21/2023  *Please note that this is a verbal dictation therefore any spelling or grammatical errors are due to the "[employer] One" system interpretation.  Chart reviewed inclusive of vital signs, progress notes, laboratory results, and diagnostic images.   (+) vomiting event this morning.   I met with Tyler [name] at bedside he appear more somnolent though is arousable.   Patients family have elected to proceed with G-tube placement which will take place this morning.  I have spoken to patients RN, Tyler [name] who endorses no concerns this morning.  I have called patients daughter, Tyler [name] though voice mailbox was full.  _______________________ Addendum:  I had a conversation with patients daughter, Tyler [name] who shares that she understands her father situation very well though her family remains to be struggling with where he is at. She notes that it is not her making the decisions but the whole family and that there is cultural component  whereby it becomes very difficult to have conversations on death and dying. Tyler [name] notes that she is working with her family to try to get them to understand the situation that Tyler [name] is in presently.   Questions and concerns addressed/Palliative Support Provided.   Add time: 15  Objective Assessment: Vital Signs Vitals:   05/21/23 0915 05/21/23 1158  BP:  133/82  Pulse:  92  Resp:  (!) 22  Temp:  99.3 F (37.4 C)  SpO2: 93% 96%    Intake/Output Summary (Last 24 hours) at 05/21/2023 1318 Last data filed at 05/21/2023 1000 Gross per 24 hour  Intake --  Output 3420 ml  Net -3420 ml   Last Weight  Most recent update: 05/21/2023  5:11 AM    Weight  82.2 kg (181 lb 3.5 oz)            Gen: Elderly African-American male chronically ill-appearing HEENT: Core track, moist mucous membranes CV: Regular rate and rhythm  PULM: On 2LPM [place], breathing is unlabored ABD: soft/nontender  EXT: Generalized edema  Neuro: Awake, tries to speak, able to track  SUMMARY OF RECOMMENDATIONS   Full Code / Full scope of care   G-Tube is going to be placed  today   The PMT will remain peripherally involved   MDM:Low ______________________________________________________________________________________ Tyler [name] [place] Palliative Medicine Team Team Cell Phone: [phone] Please utilize secure chat with additional questions, if there is no response within 30 minutes please call the above phone number  Palliative Medicine Team providers are available by phone from 7am to 7pm daily and can be reached through the team cell phone.  Should this patient require assistance outside of these hours, please call the patient's attending physician.

## 2023-05-21 NOTE — Progress Notes (Signed)
 PROGRESS NOTE  Tyler [name] UJW:[id number] DOB: 05-24-1950   PCP: Nechama Guard, FNP  Patient is from: Home.  DOA: 04/22/2023 LOS: 29  Chief complaints No chief complaint on file.    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  Patient developed neck swelling, drooling, difficulty swallowing and altered mental status with concern for partial seizure.  Pulmonology consulted on 2/28.  He was intubated and admitted to ICU.  Core track placed on 3/1.  MRI brain without contrast on 3/11 showed resolving diffusion abnormality within the posterior right MCA territory and increased extent of hyperintense T2 weighted signal in right hemisphere concerning for progression of lymphoma or confluence of postischemic/postictal edema and lymphoma related vasogenic edema.  Eventually extubated on 3/2 but continued to have focal seizure.  He was reintubated and finally extubated on 3/14 again.  He was transferred to hospitalist service on 3/17.  Oncology and neurology involved but signed off.  Palliative medicine following.  G-tube placed by IR on 3/26.  Subjective: Seen and examined earlier this morning.  Patient had an episode of emesis followed by significant desaturation, tachycardia and tachypnea about 4 AM this morning.  He required NRB.  PCCM consulted.  CXR with worsening atelectasis.  However, respiratory status improved quickly and he was weaned to 2 L by [place].  Objective: Vitals:   05/21/23 0900 05/21/23 0905 05/21/23 0915 05/21/23 1158  BP: (!) 142/93  126/84  133/82  Pulse: 100 98  92  Resp: 18 18  (!) 22  Temp:    99.3 F (37.4 C)  TempSrc:    Oral  SpO2: 93% 93% 93% 96%  Weight:      Height:        Examination:  GENERAL: No apparent distress.  Nontoxic. HEENT: MMM.  Vision and hearing grossly intact.  NECK: Supple.  No apparent JVD.  RESP:  No IWOB.  Fair aeration bilaterally.  Rhonchi bilaterally. CVS:  RRR. Heart sounds normal.  ABD/GI/GU: BS+. Abd soft, NTND.  G-tube in place. MSK/EXT:  Moves right extremities.  Cannot move left extremities. No edema.  SKIN: no apparent skin lesion or wound NEURO: Patient is sleeping likely from fentanyl and Versed he received for G-tube placement. PSYCH: Sleeping.  Consultants:  Hematology/oncology admitted patient Critical care Neurology Radiation oncology Palliative medicine Interventional radiology  Procedures: Intubation and mechanical ventilation. G-tube placement  Microbiology summarized: 3/1-MRSA PCR screen nonreactive. 3/1-respiratory culture negative 3/1-a 20 pathogen RVP nonreactive 3/1-blood cultures NGTD 3/11-respiratory culture with abundant Pseudomonas aeruginosa 3/13-blood cultures NGTD   Assessment and plan: Acute ischemic stroke: MRI on 3/11 showed resolving diffusion abnormality within the posterior right MCA territory, and increased extent of hyperintense T2 weighted signal in the right hemisphere concerning for progression of lymphoma or postischemic edema.  CT angio head and neck without LVO or high-grade stenosis.  TTE on 3/1 without significant finding.  LDL 97.  A1c 5.2% on 2/12. -Appreciated by neurology-continue aspirin 81 mg per tube or 300 mg rectally -G-tube placed on 3/26. -PT, OT, SLP and telemetry  Focal seizure secondary to cerebral edema: LTM EEG on 3/3 with clinical  signs of seizures arising from right parietal region and evidence of epileptogenicity and cortical dysfunction arising from the right hemisphere.  Repeat LTM EEG on 3/20 with  no seizure but evidence of epileptogenicity and cortisol dysfunction arising from right hemisphere. -On Vimpat, Keppra and gabapentin per neurology. -Continue Decadron -Changed meds to IV until we are able to use G-tube. -Seizure precaution -Neurology signed off.  High-grade B-cell lymphoma with resection and recent chemo-primarily admitted second cycle of methotrexate and leucovorin -Oncology recommending goal of care discussion. -No clear role of radiation per radiation oncology. -Continue Decadron and Keppra. -Palliative medicine on board.  Dysphagia: Due to the above.  NPO.  Excessive oral secretion and not allowing suctioning. -Was on tube feed via cortrack for almost 4 weeks -G-tube placed on 3/26.  Will resume feeding tube on 3/27 per IR -Stopped scopolamine patch.  Start glycopyrrolate 0.5 mg twice daily  Possible aspiration pneumonia/pneumonitis: Respiratory culture with Pseudomonas. Completed IV Zosyn from 3/13-3/18.  He had acute hypoxemia, tachycardia and tachypnea after emesis the morning of 3/26.  Required NRB but was able to wean to 2 L by nasal cannula quickly. -Aspiration precaution -Elevate head of bed  Paroxysmal atrial fibrillation: Rate controlled without meds.  Not on AC due to bleeding risk. -Optimize electrolytes   Previous PEs LLE DVT: No longer on AC.  Delirium: Has had fluctuating mental status. -Delirium precaution -Continue monitoring  Acute urinary retention: Bladder scan showed 650 cc on 3/24.  Foley inserted and had 750 cc amber clear urine right away.  Has had prostatectomy for prostate cancer. -Continue Foley catheter.  I do not know if he can pass voiding trial since he is bedridden.   Prostate cancer s/p prostatectomy   Hepatitis B -Continue tenofovir 25 daily  Advance care planning: Extensive discussion with patient's daughter, Karena Addison over the phone.  Remains full code with full scope of care.  Palliative medicine on board.   Class I  obesity Body mass index is 28.38 kg/m. Nutrition Problem: Inadequate oral intake Etiology:  (seizures) Signs/Symptoms: NPO status Interventions: Tube feeding, Prostat   DVT prophylaxis:  enoxaparin (LOVENOX) injection 40 mg Start: 05/22/23 1000 SCDs Start: 04/25/23 2209  Code Status: Full code Family Communication: None at bedside today. Level of care: Progressive Status is: Inpatient Remains inpatient appropriate because: Seizure, diffuse B-cell lymphoma, acute CVA and dysphagia   Final disposition: Likely SNF.   55 minutes with more than 50% spent in reviewing records, counseling patient/family and coordinating care.   Sch Meds:  Scheduled Meds:  aspirin  81 mg Per Tube Daily   Chlorhexidine Gluconate Cloth  6 each Topical Nightly   cholecalciferol  2,000 Units Per Tube Daily   vitamin B-12  1,000 mcg Per Tube Daily   dexamethasone  4 mg Per Tube Q12H   Or   dexamethasone (DECADRON) injection  4 mg Intravenous Q12H   [START ON 05/22/2023] enoxaparin (LOVENOX) injection  40 mg Subcutaneous Q24H   feeding supplement (PROSource TF20)  60 mL Per Tube BID   fiber supplement (BANATROL TF)  60 mL Per Tube TID   folic acid  1 mg Per Tube Daily   gabapentin  100 mg Per Tube Q8H   glycopyrrolate  0.5 mg Per Tube BID   Or   glycopyrrolate  0.1 mg Intravenous BID   insulin aspart  0-15 Units Subcutaneous Q4H   lacosamide  200 mg Per Tube BID   levETIRAcetam  2,000 mg Per Tube BID   lidocaine  15  mL Mouth/Throat Once   metoCLOPramide (REGLAN) injection  5 mg Intravenous Q12H   sodium chloride flush  10-40 mL Intracatheter Q12H   sodium chloride flush  3-10 mL Intravenous Q12H   tenofovir alafenamide  25 mg Per Tube Daily   thiamine (VITAMIN B1) injection  100 mg Intravenous Daily   Continuous Infusions:  dextrose 5 % and 0.9 % NaCl 1,000 mL (05/21/23 1211)   feeding supplement (OSMOLITE 1.5 CAL) Stopped (05/20/23 2354)   levETIRAcetam     PRN Meds:.acetaminophen, alum &  mag hydroxide-simeth, hydrALAZINE, hydrocortisone, lidocaine, LORazepam, magic mouthwash w/lidocaine, naLOXone (NARCAN)  injection, ondansetron (ZOFRAN) IV, mouth rinse, polyethylene glycol, senna-docusate, sodium chloride flush, sodium chloride flush  Antimicrobials: Anti-infectives (From admission, onward)    Start     Dose/Rate Route Frequency Ordered Stop   05/21/23 0900  ceFAZolin (ANCEF) IVPB 2g/100 mL premix        2 g 200 mL/hr over 30 Minutes Intravenous  Once 05/21/23 0804 05/21/23 0900   05/09/23 1000  tenofovir alafenamide (VEMLIDY) tablet 25 mg        25 mg Per Tube Daily 05/08/23 1135     05/08/23 1245  piperacillin-tazobactam (ZOSYN) IVPB 3.375 g        3.375 g 12.5 mL/hr over 240 Minutes Intravenous Every 8 hours 05/08/23 1153 05/13/23 0955   05/08/23 1000  tenofovir (VIREAD) tablet 300 mg  Status:  Discontinued        300 mg Per Tube Daily 05/07/23 1143 05/08/23 1135   05/07/23 2300  vancomycin (VANCOCIN) IVPB 1000 mg/200 mL premix  Status:  Discontinued        1,000 mg 200 mL/hr over 60 Minutes Intravenous Every 12 hours 05/07/23 1218 05/08/23 1401   05/07/23 1015  vancomycin (VANCOREADY) IVPB 1750 mg/350 mL        1,750 mg 175 mL/hr over 120 Minutes Intravenous  Once 05/07/23 0921 05/07/23 1236   05/07/23 1015  ceFEPIme (MAXIPIME) 2 g in sodium chloride 0.9 % 100 mL IVPB  Status:  Discontinued        2 g 200 mL/hr over 30 Minutes Intravenous Every 8 hours 05/07/23 0921 05/08/23 1153   04/28/23 1000  tenofovir alafenamide (VEMLIDY) tablet 25 mg  Status:  Discontinued        25 mg Per Tube Daily 04/27/23 2309 05/07/23 1143   04/23/23 1000  tenofovir alafenamide (VEMLIDY) tablet 25 mg  Status:  Discontinued        25 mg Oral Daily 04/22/23 1520 04/27/23 2309        I have personally reviewed the following labs and images: CBC: Recent Labs  Lab 05/15/23 0718 05/16/23 0521 05/20/23 0535 05/21/23 0446  WBC 10.0 7.9 6.9 8.4  NEUTROABS 7.3  --   --   --   HGB  11.4* 10.9* 11.6* 13.5  HCT 34.3* 33.1* 34.4* 39.8  MCV 91.2 89.9 88.7 88.1  PLT 237 228 201 217   BMP &GFR Recent Labs  Lab 05/15/23 0718 05/16/23 0521 05/20/23 0535 05/21/23 0446  NA 135 134* 137 137  K 4.1 4.1 4.0 3.8  CL 101 101 105 104  CO2 27 28 26 22   GLUCOSE 125* 133* 99 118*  BUN 23 21 26* 31*  CREATININE 0.73 0.68 0.75 0.86  CALCIUM 8.4* 8.0* 8.3* 8.8*  MG 1.9 1.8 1.9 1.9  PHOS 4.4 4.1 4.8* 5.3*   Estimated Creatinine Clearance: 80.8 mL/min (by C-G formula based on SCr of 0.86 mg/dL). Liver & Pancreas:  Recent Labs  Lab 05/15/23 0718 05/16/23 0521 05/20/23 0535 05/21/23 0446  AST 24  --  25 33  ALT 59*  --  53* 76*  ALKPHOS 60  --  60 78  BILITOT 0.4  --  0.6 0.7  PROT 5.2*  --  5.1* 6.2*  ALBUMIN 2.4* 2.4* 2.4* 2.9*   No results for input(s): "LIPASE", "AMYLASE" in the last 168 hours. No results for input(s): "AMMONIA" in the last 168 hours. Diabetic: No results for input(s): "HGBA1C" in the last 72 hours. Recent Labs  Lab 05/20/23 1946 05/20/23 2331 05/21/23 0406 05/21/23 0725 05/21/23 1159  GLUCAP 118* 144* 110* 107* 82   Cardiac Enzymes: No results for input(s): "CKTOTAL", "CKMB", "CKMBINDEX", "TROPONINI" in the last 168 hours. No results for input(s): "PROBNP" in the last 8760 hours. Coagulation Profile: Recent Labs  Lab 05/20/23 0905  INR 1.2   Thyroid Function Tests: No results for input(s): "TSH", "T4TOTAL", "FREET4", "T3FREE", "THYROIDAB" in the last 72 hours. Lipid Profile: No results for input(s): "CHOL", "HDL", "LDLCALC", "TRIG", "CHOLHDL", "LDLDIRECT" in the last 72 hours.  Anemia Panel: No results for input(s): "VITAMINB12", "FOLATE", "FERRITIN", "TIBC", "IRON", "RETICCTPCT" in the last 72 hours. Urine analysis:    Component Value Date/Time   COLORURINE YELLOW 05/08/2023 1632   APPEARANCEUR HAZY (A) 05/08/2023 1632   LABSPEC >1.046 (H) 05/08/2023 1632   PHURINE 6.0 05/08/2023 1632   GLUCOSEU NEGATIVE 05/08/2023 1632    HGBUR NEGATIVE 05/08/2023 1632   BILIRUBINUR NEGATIVE 05/08/2023 1632   KETONESUR NEGATIVE 05/08/2023 1632   PROTEINUR NEGATIVE 05/08/2023 1632   NITRITE NEGATIVE 05/08/2023 1632   LEUKOCYTESUR NEGATIVE 05/08/2023 1632   Sepsis Labs: Invalid input(s): "PROCALCITONIN", "LACTICIDVEN"  Microbiology: No results found for this or any previous visit (from the past 240 hours).   Radiology Studies: IR GASTROSTOMY TUBE MOD SED Result Date: 05/21/2023 INDICATION: Dysphagia EXAM: FLUOROSCOPIC 20 FRENCH PULL-THROUGH GASTROSTOMY Date:  05/21/2023 05/21/2023 9:08 am Radiologist:  M. Ruel Favors, MD Guidance:  Fluoroscopic MEDICATIONS: Ancef 2 g; Antibiotics were administered within 1 hour of the procedure. Glucagon 0.5 mg IV ANESTHESIA/SEDATION: Versed 0.5 mg IV; Fentanyl 25 mcg IV Moderate Sedation Time:  10 minute The patient was continuously monitored during the procedure by the interventional radiology nurse under my direct supervision. CONTRAST:  15mL OMNIPAQUE IOHEXOL 300 MG/ML SOLN - administered into the gastric lumen. FLUOROSCOPY: Fluoroscopy Time: 1 minutes 42 seconds (10 mGy). COMPLICATIONS: None immediate. PROCEDURE: Informed consent was obtained from the patient's family following explanation of the procedure, risks, benefits and alternatives. The patient understands, agrees and consents for the procedure. All questions were addressed. A time out was performed. Maximal barrier sterile technique utilized including caps, mask, sterile gowns, sterile gloves, large sterile drape, hand hygiene, and betadine prep. The left upper quadrant was sterilely prepped and draped. An oral gastric catheter was inserted into the stomach under fluoroscopy. The existing nasogastric feeding tube was removed. Air was injected into the stomach for insufflation and visualization under fluoroscopy. The air distended stomach was confirmed beneath the anterior abdominal wall in the frontal and lateral projections. Under sterile  conditions and local anesthesia, a 17 gauge trocar needle was utilized to access the stomach percutaneously beneath the left subcostal margin. Needle position was confirmed within the stomach under biplane fluoroscopy. Contrast injection confirmed position also. A single T tack was deployed for gastropexy. Over an Amplatz guide wire, a 9-French sheath was inserted into the stomach. A snare device was utilized to capture the oral gastric catheter. The  snare device was pulled retrograde from the stomach up the esophagus and out the oropharynx. The 20-French pull-through gastrostomy was connected to the snare device and pulled antegrade through the oropharynx down the esophagus into the stomach and then through the percutaneous tract external to the patient. The gastrostomy was assembled externally. Contrast injection confirms position in the stomach. Images were obtained for documentation. The patient tolerated procedure well. No immediate complication. IMPRESSION: Fluoroscopic insertion of a 20-French "pull-through" gastrostomy. Electronically Signed   By: Judie Petit.  Shick M.D.   On: 05/21/2023 09:12   DG Chest Port 1 View Result Date: 05/21/2023 CLINICAL DATA:  Shortness of breath EXAM: PORTABLE CHEST 1 VIEW COMPARISON:  Eight days prior FINDINGS: Cardiomegaly and upper mediastinal widening from aortic tortuosity and mediastinal fat by recent CT. Porta catheter on the right with tip at the upper right atrium. Feeding tube at least reaches the stomach. Low volume chest with increased hazy and streaky density at the bases. No edema, consolidation, or pneumothorax. Trace pleural fluid on the right. IMPRESSION: Low volume chest with atelectasis that is mildly progressed. No new abnormality. Electronically Signed   By: Tiburcio Pea M.D.   On: 05/21/2023 05:52        Adessa Primiano T. Arlisa Leclere [facility]  If 7PM-7AM, please contact night-coverage [email or web address] 05/21/2023, 2:05 PM

## 2023-05-21 NOTE — Plan of Care (Signed)
 At around 0400 received call from tele stating that pt HR was sustaining in 140s to 150s. Arrived to room and found pt hypoxic with Sp02 in the 80s on room air. Then pt began vomiting up white thick fluid. Placed pt on nasal cannula up to 15L, Attempted to oral suction pt and then RT did NT suction. Did get lots of white secretions out. Pt was clearly in respiratory distress, with nasal flaring and abdominal retractions and accessory muscle use. Pt was opening his eyes to voice but not following commands or nodding appropriately. Stat chest x ray and labs ordered by MD. Eventually was able to wean pt down to 2L [place], and pt was satting around 94-96%, and HR went down to 110-115 range. Pt was also able to start following commands at this point and was squeezing this writer's hand to command. Hospitalist and ICU MD Roshi and NP came and evaluated pt. Stated to continue to monitor but no need to transfer to ICU unless pt's condition worsens again.

## 2023-05-21 NOTE — Progress Notes (Signed)
 SLP Cancellation Note  Patient Details Name: Tyler [name] MRN: [id number] DOB: April 08, 1950   Cancelled treatment:       Reason Eval/Treat Not Completed: Patient at procedure or test/unavailable In IR receiving PEG. Will continue efforts for po trials and SLE when able.    Royce Macadamia 05/21/2023, 9:08 AM

## 2023-05-21 NOTE — Progress Notes (Signed)
 PT Cancellation Note  Patient Details Name: Tyler [name] MRN: [id number] DOB: April 05, 1950   Cancelled Treatment:    Reason Eval/Treat Not Completed: Patient at procedure or test/unavailable (Pt off the floor at IR receiving PEG tube. Will follow up if time allows.)   Gladys Damme 05/21/2023, 9:53 AM

## 2023-05-21 NOTE — Procedures (Signed)
 Interventional Radiology Procedure Note  Procedure: FLUORO 20FR GTUBE    Complications: None  Estimated Blood Loss:  MIN  Findings: CONFIRMED IN THE STOMACH  FULL REPORT IN PACS     Sharen Counter, MD

## 2023-05-21 NOTE — Progress Notes (Signed)
 [facility] night cross cover note:   I was notified by RN that this patient has become acutely hypoxic this morning after episode of vomiting.  The patient had previously been maintaining oxygen saturations in the mid to high 90s on room air this evening leading up to this episode of vomiting, at which time his oxygen saturations decreased into the 80s on room air.  This has been associated with increased respiratory rate into the range of 30-40.  He has subsequently been started on nonrebreather, with ensuing oxygen saturations of 100% on 15 L nonrebreather.  Remains tachycardic, sinus on the monitor, with heart rates in the 120s to 130s. I have evaluated the patient at bedside, with increased work of breath noted.   This is a 73 year old male who has been in the hospital for 28 days, initially with acute ischemic stroke complicated by seizures as a consequence of associated cerebral edema.  Over the course of this hospitalization, he has been intubated on 2 occasions, with most recent extubation occurring on 05/09/2023.  More recently he has been on the hospitalist service, with ensuing hospital course complicated by significant residual dysphagia as a consequence of his acute ischemic CVA. He has remained NPO and has cortrak in place, receiving tube feeds, with plan for IR guided G tube placement to occur on 3/26.   Palliative care service has been involved, including for assistance with clarification of goals of care.  At this time, the patient remains full code, including amenable to reintubation.  I have initially ordered stat chest x-ray, BNP, procalcitonin level to access the above. Will hold tube feeds for now. Order for prn iv zofran confirmed. Respiratory therapy is evaluating the patient and performing NT suctioning, for which I've added a prn order.   There is concern for increased risk for additional aspiration should the patient vomit again while on NRB. Consideration given to changing O2 delivery  to heated high flow [place] as a consequence.   In the setting of the patient's acute hypoxic respiratory failure, increased work of breathing with RR in the 30-40's, and a history of multiple prior intubations during this current hospitalization, I discussed patient's case with on-call PCCM physician, Dr. Reita May, who conveyed that critical care with evaluate the patient at this time, with additional recommendations to follow.      Newton Pigg, DO Hospitalist

## 2023-05-21 NOTE — Progress Notes (Signed)
 NAME:  Tyler [name], MRN:  [id number], DOB:  [birth year], LOS: 29 ADMISSION DATE:  04/22/2023, CONSULTATION/ SERVICE  DATE:  04/25/23 REFERRING MD:  Dr. Candise Che, CHIEF COMPLAINT: Neck swelling, drooling, difficulty swallowing, altered mental status with concern for partial seizures  History of Present Illness:  Tyler [name] is a 73 year old gentleman with significant history of CNS relapse of large B-cell lymphoma, partial seizure who was admitted to oncology service on 2/25 for second cycle of high-dose methotrexate with leucovorin rescue.  Patient also has prior history of paroxysmal atrial fibrillation not on anticoagulation due to bleeding risk, prostate cancer status post prostatectomy in 2021, pulmonary embolism, hepatitis B exposure.  Patient had high-grade large B-cell lymphoma and completed 6 cycles of R-CHOP with systemic remission in the past.  As per the primary oncologist, patient was doing well and receiving his medication as planned.  He was started on bicarb drip to minimize the methotrexate related toxicity.  However, this evening, oncologist noted increased neck swelling as well as patient had difficulty swallowing.  He was also noted to have drooling of saliva and developed altered mental status with facial twitching concerning for seizure activity.  There was concern about patient's ability to protect airway and possible angioedema (neck swelling), ED was consulted and patient was intubated emergently.  PMH    has a past medical history of Cancer (HCC), Follicular lymphoma (HCC) (2024), History of pulmonary embolism, Hypertension, Myelodysplastic syndrome (HCC), PAF (paroxysmal atrial fibrillation) (HCC), and Recurrent UTI.   has a past surgical history that includes Robot assisted laparoscopic radical prostatectomy (N/A, 09/01/2019); Lymphadenectomy (Bilateral, 09/01/2019); Esophagogastroduodenoscopy (egd) with propofol (N/A, 08/21/2022); biopsy (08/21/2022); IR IMAGING GUIDED PORT  INSERTION (08/26/2022); Craniotomy (Right, 03/10/2023); Application of cranial navigation (Right, 03/10/2023); and IR GASTROSTOMY TUBE MOD SED (05/21/2023).   Significant events   04/22/2023 - ADMIT  2/27  - MRI brain - Marked interval decrease in size of right parietal mass/lymphoma, now measuring 2.9 x 3.5 x 2.3 cm (previously 5.4 x 3.6 x 4.4 cm), consistent with interval response to therapy. Persistent but improved vasogenic edema within the adjacent posterior right cerebral hemisphere, with interval resolution of previously seen right-to-left midline shift. 2. 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 Correlation with symptomatology and EEG recommended. Evolving changes of subacute ischemia would be the primary differential consideration, but are felt to be less likely.  2/28 - Transferred to ICU and intubated. Witnessed by Dr Candise Che with have left neck swelling, gurgling, and twitching left side -> then partially respnsvive -> ativan/confused -> EDP intubated. Around 7.30-8pm  04/26/23: Coretrack placed early hours .Intubated early hours.  On the ventilator FiO2 50%.  Low-grade fever since 04/17/2023 but normal white count. Ceribell EEG oongoing. Prmary language for aptient is Swahili. SBP HIGH with HR 40s - on scheduled lopressor . Ceribel shows    ceribell EEG is is suggestive of severe diffuse encephalopathy. No seizures were seen throughout the recording.   3/2 Extubated. Remains on PANDA. Scheduled for barium today. Remains on cEEG per Neuro  3/3-3/7 Continues to have focal seizures refractory to increased AED use. Family meeting held with agreement to continue to escalate treatment and proceed with intubation if needed.  3/8 Intubated  3/10 continues to have breakthrough seizures  3/14-no further seizures.  Patient awake and following commands.  Extubated.  3/26 re-consult with concern  for aspiration event  SUBJECTIVE/OVERNIGHT/INTERVAL HX   Desaturated with vomiting aspiration presumed. Weaned  to baseline O2, likely pneumonitis.   Objective   Blood pressure 133/82, pulse 92, temperature 99.3 F (37.4 C), temperature source Oral, resp. rate (!) 22, height 5' 7.01" (1.702 m), weight 82.2 kg, SpO2 96%.        Intake/Output Summary (Last 24 hours) at 05/21/2023 1225 Last data filed at 05/21/2023 1000 Gross per 24 hour  Intake --  Output 3420 ml  Net -3420 ml   Filed Weights   05/19/23 0500 05/20/23 0500 05/21/23 0500  Weight: 83.6 kg 81 kg 82.2 kg   Physical Exam: Gen:      lying in bed in no distress. HEENT: AT/[place] Lungs:    Breathing comfortably  CV:         HS normal  Abd:    Soft.   Ext:   No edema Skin:      Warm and dry; no rash Neuro:  asleep but arousable  Ancillary Tests Personally Reviewed:  CXR without clear infiltrate  Assessment & Plan:   Recurrent aspiration events: Baseline swallowing issues related to CNS lymphoma and sequela of disease and treatment recently worsened with acute stroke.  He has high risk of aspiration for oropharyngeal secretions as well as aspiration of tube feeds.  There is no solution to this.  Chest x-ray without clear new pneumonia.  He is on minimal oxygen.  Likely aspiration pneumonitis on 3/26.  Plan: Aspiration precautions, head of bed.  30 degrees, frequent suctioning as needed, ongoing palliative care, goals of care discussion  PCCM will sign off  Best practice:  Per primary   Karren Burly, MD Pulmonary/ICU Physician [facility]  See Amion

## 2023-05-22 ENCOUNTER — Other Ambulatory Visit: Payer: Self-pay

## 2023-05-22 ENCOUNTER — Inpatient Hospital Stay (HOSPITAL_COMMUNITY)

## 2023-05-22 DIAGNOSIS — Z7189 Other specified counseling: Secondary | ICD-10-CM | POA: Diagnosis not present

## 2023-05-22 DIAGNOSIS — C8339 Primary central nervous system lymphoma: Secondary | ICD-10-CM | POA: Diagnosis not present

## 2023-05-22 DIAGNOSIS — C8338 Diffuse large B-cell lymphoma, lymph nodes of multiple sites: Secondary | ICD-10-CM | POA: Diagnosis not present

## 2023-05-22 DIAGNOSIS — R569 Unspecified convulsions: Secondary | ICD-10-CM | POA: Diagnosis not present

## 2023-05-22 LAB — CBC
HCT: 34.7 % — ABNORMAL LOW (ref 39.0–52.0)
HCT: 35.6 % — ABNORMAL LOW (ref 39.0–52.0)
Hemoglobin: 11.5 g/dL — ABNORMAL LOW (ref 13.0–17.0)
Hemoglobin: 11.8 g/dL — ABNORMAL LOW (ref 13.0–17.0)
MCH: 29.8 pg (ref 26.0–34.0)
MCH: 29.7 pg (ref 26.0–34.0)
MCHC: 33.1 g/dL (ref 30.0–36.0)
MCHC: 33.1 g/dL (ref 30.0–36.0)
MCV: 89.9 fL (ref 80.0–100.0)
MCV: 89.7 fL (ref 80.0–100.0)
Platelets: 148 10*3/uL — ABNORMAL LOW (ref 150–400)
Platelets: 153 10*3/uL (ref 150–400)
RBC: 3.86 MIL/uL — ABNORMAL LOW (ref 4.22–5.81)
RBC: 3.97 MIL/uL — ABNORMAL LOW (ref 4.22–5.81)
RDW: 14.1 % (ref 11.5–15.5)
RDW: 13.9 % (ref 11.5–15.5)
WBC: 7.7 10*3/uL (ref 4.0–10.5)
WBC: 8.4 10*3/uL (ref 4.0–10.5)
nRBC: 0 % (ref 0.0–0.2)
nRBC: 0.2 % (ref 0.0–0.2)

## 2023-05-22 LAB — GLUCOSE, CAPILLARY
Glucose-Capillary: 120 mg/dL — ABNORMAL HIGH (ref 70–99)
Glucose-Capillary: 124 mg/dL — ABNORMAL HIGH (ref 70–99)
Glucose-Capillary: 135 mg/dL — ABNORMAL HIGH (ref 70–99)
Glucose-Capillary: 136 mg/dL — ABNORMAL HIGH (ref 70–99)
Glucose-Capillary: 141 mg/dL — ABNORMAL HIGH (ref 70–99)
Glucose-Capillary: 153 mg/dL — ABNORMAL HIGH (ref 70–99)
Glucose-Capillary: 165 mg/dL — ABNORMAL HIGH (ref 70–99)

## 2023-05-22 LAB — RENAL FUNCTION PANEL
Albumin: 2.2 g/dL — ABNORMAL LOW (ref 3.5–5.0)
Anion gap: 7 (ref 5–15)
BUN: 21 mg/dL (ref 8–23)
CO2: 26 mmol/L (ref 22–32)
Calcium: 8.3 mg/dL — ABNORMAL LOW (ref 8.9–10.3)
Chloride: 106 mmol/L (ref 98–111)
Creatinine, Ser: 0.67 mg/dL (ref 0.61–1.24)
GFR, Estimated: >60 mL/min (ref >60)
Glucose, Bld: 130 mg/dL — ABNORMAL HIGH (ref 70–99)
Phosphorus: 3.5 mg/dL (ref 2.5–4.6)
Potassium: 3.6 mmol/L (ref 3.5–5.1)
Sodium: 139 mmol/L (ref 135–145)

## 2023-05-22 LAB — BASIC METABOLIC PANEL WITH GFR
Anion gap: 11 (ref 5–15)
BUN: 25 mg/dL — ABNORMAL HIGH (ref 8–23)
CO2: 20 mmol/L — ABNORMAL LOW (ref 22–32)
Calcium: 8.4 mg/dL — ABNORMAL LOW (ref 8.9–10.3)
Chloride: 107 mmol/L (ref 98–111)
Creatinine, Ser: 0.67 mg/dL (ref 0.61–1.24)
GFR, Estimated: >60 mL/min (ref >60)
Glucose, Bld: 131 mg/dL — ABNORMAL HIGH (ref 70–99)
Potassium: 3.5 mmol/L (ref 3.5–5.1)
Sodium: 138 mmol/L (ref 135–145)

## 2023-05-22 LAB — MAGNESIUM: Magnesium: 1.9 mg/dL (ref 1.7–2.4)

## 2023-05-22 MED ORDER — MORPHINE SULFATE (PF) 2 MG/ML IV SOLN
1.0000 mg | INTRAVENOUS | Status: DC | PRN
  Administered 2023-05-22: 2 mg via INTRAVENOUS
  Filled 2023-05-22: qty 1

## 2023-05-22 MED ORDER — GUAIFENESIN 100 MG/5ML PO LIQD: 10.0000 mL | Freq: Four times a day (QID) | ORAL | Status: DC | PRN

## 2023-05-22 MED ORDER — SODIUM CHLORIDE 0.9 % IV SOLN
3.0000 g | Freq: Four times a day (QID) | INTRAVENOUS | Status: DC
  Administered 2023-05-22 – 2023-05-23 (×2): 3 g via INTRAVENOUS
  Filled 2023-05-22 (×2): qty 8

## 2023-05-22 MED ORDER — LEVALBUTEROL HCL 0.63 MG/3ML IN NEBU
0.6300 mg | INHALATION_SOLUTION | Freq: Four times a day (QID) | RESPIRATORY_TRACT | Status: DC | PRN
  Administered 2023-05-22: 0.63 mg via RESPIRATORY_TRACT
  Filled 2023-05-22: qty 3

## 2023-05-22 MED ORDER — GUAIFENESIN ER 600 MG PO TB12: 600.0000 mg | ORAL_TABLET | Freq: Two times a day (BID) | ORAL | Status: DC

## 2023-05-22 NOTE — Plan of Care (Addendum)
 Peg placed yesterday, and in-use today. No vomit or regurgitation noted. IV morphine x1. Awaiting placement.   Problem: Nutrition: Goal: Adequate nutrition will be maintained Outcome: Not Met (add Reason)   Problem: Safety: Goal: Ability to remain free from injury will improve Outcome: Not Met (add Reason)   Problem: Skin Integrity: Goal: Risk for impaired skin integrity will decrease Outcome: Not Met (add Reason)   Problem: Bowel/Gastric: Goal: Will not experience complications related to bowel motility Outcome: Not Met (add Reason)

## 2023-05-22 NOTE — Progress Notes (Signed)
 Speech Language Pathology Treatment: Dysphagia  Patient Details Name: Tyler [name] MRN: [id number] DOB: 1950/04/18 Today's Date: 05/22/2023 Time: 4098-1191 SLP Time Calculation (min) (ACUTE ONLY): 13 min  Assessment / Plan / Recommendation Clinical Impression  Pt received PEG yesterday and today seen for dysphagia intervention. Suspect decreased saliva management as observed coughing, reddened face prior to po's. He has significant left facial paresis at rest. He continues to exhibit severe oral dysphagia accepting bite of applesauce and holding in oral cavity without attempts to manipulate despite verbal and visual cues. After approximately 3 minutes a swallow was palpated and suspect he was unable to mobilize any to posterior oral cavity with majority of bolus on tongue and required suctioning. Teaspoon and cup sips water given with significant anterior spillage of most of bolus suspecting he was unable to contain in oral cavity to propel. He did have a delayed cough after several minutes and may have had some premature spill with water and possible laryngeal intrusion however unable to determine. Pt is not appropriate for MBS at this time and ST will continue to work with pt on mobilizing boluses to initiate a pharyngeal swallow. MD stated pt may be getting close to discharge with PEG placement yesterday. He may not be ready for MBS before he is discharged. Recommend continuing ST at SNF and SLP can refer for outpatient MBS when appropriate.     HPI HPI: 73 yo male presents to [facility] on 2/10 with LUE and LE weakness, and change in mentation. Head CT of brain shows a recurrent tumor at the craniotomy site and increased size of recurrent tumor along with a left midline shift up to 6 mm.  Hospital course complicated by concern for neck swelling, acute dysphagia, and concern for airway protection prompting intubation from 02/28-03/02. Neck CT on 04/26/23: demonstrated "No acute finding. Assessment of mucosal  surfaces limited by airway collapse in the setting of intubation." MBS 3/3 showed an oral more than pharyngeal dysphagia although with silent aspiration x1 with thin liquids. Aspiration risk was felt to be mostly related to impulsivity/mentation, and he did progress from full liquid to pureed diet. Seizure activity increased though resulting in reintubation 3/8-3/14. Pt's swallowing was previously evaluated when he was admitted in June of 2024 - he was D/Cd on a dysphagia 3 diet with thin liquids with no f/u recommended. Pt's swallowing assessed again on 04/08/23 this admission with recommendation of a regular diet and thin liquids.  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      SLP Plan  Continue with current plan of care  Patient needs continued Speech Lanaguage Pathology Services   Recommendations for follow up therapy are one component of a multi-disciplinary discharge planning process, led by the attending physician.  Recommendations may be updated based on patient status, additional functional criteria and insurance authorization.    Recommendations  Diet recommendations: NPO;Other(comment) (ice chips after oral care) Medication Administration: Via alternative means                  Oral care QID;Staff/trained caregiver to provide oral care   Frequent or constant Supervision/Assistance Dysphagia, unspecified (R13.10)     Continue with current plan of care     Royce Macadamia  05/22/2023, 11:01 AM

## 2023-05-22 NOTE — NC FL2 (Addendum)
 [place] MEDICAID FL2 LEVEL OF CARE FORM     IDENTIFICATION  Patient Name: Tyler [name] Birthdate: Jul 16, 1950 Sex: male Admission Date (Current Location): 04/22/2023  [facility] and [place] Number:  Producer, television/film/video and Address:  [facility], [address] [place], [place] [address]      Provider Number: [id number]  Attending Physician Name and Address:  Almon Hercules, MD  Relative Name and Phone Number:       Current Level of Care: Hospital Recommended Level of Care: Skilled Nursing Facility Prior Approval Number:    Date Approved/Denied:   PASRR Number: [id number]  Discharge Plan: SNF    Current Diagnoses: Patient Active Problem List   Diagnosis Date Noted   Neck swelling 04/26/2023   Chronic respiratory failure (HCC) 04/26/2023   Seizures (HCC) 04/11/2023   Weakness of left upper extremity 04/07/2023   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/2022   Acute renal failure with tubular necrosis (HCC) 08/08/2022   Aspiration into airway 08/08/2022   At high risk of tumor lysis syndrome 08/07/2022   Anemia 08/06/2022   Diffuse large B-cell lymphoma of lymph nodes of multiple regions (HCC) 07/25/2022   Encounter for antineoplastic chemotherapy  07/25/2022   Lumbar radiculopathy 05/31/2022   Symptomatic anemia    UTI (urinary tract infection) 12/10/2021   Septic shock (HCC) 12/10/2021   Normocytic anemia 12/10/2021   Thrombocytopenia (HCC) 12/10/2021   Hyponatremia 12/10/2021   Sepsis secondary to UTI (HCC) 12/10/2021   Pelvic hematoma in male 09/08/2019   Bilateral pulmonary embolism (HCC) 09/05/2019   Paroxysmal atrial fibrillation (HCC) 09/05/2019   Hypertension    Hypokalemia    Prostate cancer (HCC) 09/01/2019    Orientation RESPIRATION BLADDER Height & Weight     Self  O2 (Nasual Cannula) Incontinent Weight: 182 lb 1.6 oz (82.6 kg) Height:  5' 7.01" (170.2 cm)  BEHAVIORAL SYMPTOMS/MOOD NEUROLOGICAL BOWEL NUTRITION STATUS      Incontinent Diet (NPO)  AMBULATORY STATUS COMMUNICATION OF NEEDS Skin   Extensive Assist Verbally Normal                       Personal Care Assistance Level of Assistance  Bathing, Feeding, Dressing Bathing Assistance: Maximum assistance Feeding assistance: Maximum assistance Dressing Assistance: Maximum assistance     Functional Limitations Info  Sight, Speech Sight Info: Impaired   Speech Info: Impaired    SPECIAL CARE FACTORS FREQUENCY  PT (By licensed PT), OT (By licensed OT)     PT Frequency: 5x/week OT Frequency: 5x/week            Contractures Contractures Info: Not present    Additional Factors Info  Code Status, Allergies Code Status Info: Full Allergies Info: Amitriptyline, Amlodipine, Neurontin (Gabapentin)           Current  Medications (05/22/2023):  This is the current hospital active medication list Current Facility-Administered Medications  Medication Dose Route Frequency Provider Last Rate Last Admin   acetaminophen (TYLENOL) tablet 650 mg  650 mg Per Tube Q4H PRN Kalman Shan, MD   650 mg at 05/17/23 1016   alum & mag hydroxide-simeth (MAALOX/MYLANTA) 200-200-20 MG/5ML suspension 60 mL  60 mL Per Tube Q4H PRN Kalman Shan, MD   60  mL at 05/11/23 0000   aspirin chewable tablet 81 mg  81 mg Per Tube Daily Candelaria Stagers T, MD   81 mg at 05/22/23 4034   Or   aspirin suppository 300 mg  300 mg Rectal Daily Candelaria Stagers T, MD   300 mg at 05/21/23 1524   Chlorhexidine Gluconate Cloth 2 % PADS 6 each  6 each Topical Nightly Ranee Gosselin, MD   6 each at 05/22/23 7425   cholecalciferol (VITAMIN D3) 25 MCG (1000 UNIT) tablet 2,000 Units  2,000 Units Per Tube Daily Kalman Shan, MD   2,000 Units at 05/22/23 9563   cyanocobalamin (VITAMIN B12) tablet 1,000 mcg  1,000 mcg Per Tube Daily Luciano Cutter, MD   1,000 mcg at 05/22/23 8756   dexamethasone (DECADRON) tablet 4 mg  4 mg Per Tube Q12H Candelaria Stagers T, MD   4 mg at 05/22/23 4332   Or   dexamethasone (DECADRON) injection 4 mg  4 mg Intravenous Q12H Candelaria Stagers T, MD   4 mg at 05/21/23 2104   enoxaparin (LOVENOX) injection 40 mg  40 mg Subcutaneous Q24H Gonfa, Taye T, MD   40 mg at 05/22/23 0830   feeding supplement (OSMOLITE 1.5 CAL) liquid 1,000 mL  1,000 mL Per Tube Continuous Luciano Cutter, MD 55 mL/hr at 05/22/23 0858 1,000 mL at 05/22/23 0858   feeding supplement (PROSource TF20) liquid 60 mL  60 mL Per Tube BID Luciano Cutter, MD   60 mL at 05/22/23 0829   fiber supplement (BANATROL TF) liquid 60 mL  60 mL Per Tube TID Rhetta Mura, MD   60 mL at 05/22/23 0829   folic acid (FOLVITE) tablet 1 mg  1 mg Per Tube Daily Luciano Cutter, MD   1 mg at 05/22/23 9518   gabapentin (NEURONTIN) 250 MG/5ML solution 100 mg  100 mg Per Tube Q8H Mannam, Praveen, MD   100 mg at 05/21/23 0504   glycopyrrolate (ROBINUL) tablet 0.5 mg  0.5 mg Per Tube BID Candelaria Stagers T, MD       Or   glycopyrrolate (ROBINUL) injection 0.1 mg  0.1 mg Intravenous BID Candelaria Stagers T, MD   0.1 mg at 05/22/23 8416   hydrALAZINE (APRESOLINE) tablet 50 mg  50 mg Per Tube Q8H PRN Luciano Cutter, MD       hydrocortisone (ANUSOL-HC) 2.5 % rectal cream 1 Application  1 Application Rectal BID PRN  Johney Maine, MD       insulin aspart (novoLOG) injection 0-15 Units  0-15 Units Subcutaneous Q4H Luciano Cutter, MD   3 Units at 05/22/23 1220   lacosamide (VIMPAT) tablet 200 mg  200 mg Per Tube Q12H Candelaria Stagers T, MD   200 mg at 05/22/23 6063   Or   lacosamide (VIMPAT) 200 mg in sodium chloride 0.9 % 25 mL IVPB  200 mg Intravenous Q12H Candelaria Stagers T, MD 90 mL/hr at 05/21/23 2247 200 mg at 05/21/23 2247   levETIRAcetam (KEPPRA) 100 MG/ML solution 2,000 mg  2,000 mg  Per Tube BID Candelaria Stagers T, MD   2,000 mg at 05/22/23 0830   Or   levETIRAcetam (KEPPRA) 2,000 mg in sodium chloride 0.9 % 250 mL IVPB  2,000 mg Intravenous BID Candelaria Stagers T, MD 900 mL/hr at 05/21/23 2336 2,000 mg at 05/21/23 2336   lidocaine (LMX) 4 % cream   Topical TID PRN Jimmye Norman, NP       lidocaine (XYLOCAINE) 2 % viscous mouth solution 15 mL  15 mL Mouth/Throat Once Berdine Dance, MD       LORazepam (ATIVAN) injection 2 mg  2 mg Intravenous Q4H PRN Charlsie Quest, MD       magic mouthwash w/lidocaine  5 mL Oral QID PRN Johney Maine, MD       metoCLOPramide (REGLAN) injection 5 mg  5 mg Intravenous Q12H Candelaria Stagers T, MD   5 mg at 05/22/23 0829   morphine (PF) 2 MG/ML injection 1-2 mg  1-2 mg Intravenous Q4H PRN Almon Hercules, MD       naloxone (NARCAN) injection 0.4 mg  0.4 mg Intravenous PRN Howerter, Justin B, DO       ondansetron (ZOFRAN) injection 4 mg  4 mg Intravenous Q6H PRN Almon Hercules, MD   4 mg at 05/21/23 0427   Oral care mouth rinse  15 mL Mouth Rinse PRN Rhetta Mura, MD       polyethylene glycol (MIRALAX / GLYCOLAX) packet 17 g  17 g Per Tube Daily PRN Alphia Moh D, RPH       senna-docusate (Senokot-S) tablet 1 tablet  1 tablet Per Tube QHS PRN Alphia Moh D, RPH       sodium chloride flush (NS) 0.9 % injection 10-40 mL  10-40 mL Intracatheter Q12H Johney Maine, MD   10 mL at 05/22/23 0831   sodium chloride flush (NS) 0.9 % injection 10-40 mL  10-40 mL  Intracatheter PRN Johney Maine, MD   30 mL at 04/27/23 0540   sodium chloride flush (NS) 0.9 % injection 3-10 mL  3-10 mL Intravenous Q12H Lynnell Catalan, MD   3 mL at 05/22/23 0831   sodium chloride flush (NS) 0.9 % injection 3-10 mL  3-10 mL Intravenous PRN Lynnell Catalan, MD       tenofovir alafenamide (VEMLIDY) tablet 25 mg  25 mg Per Tube Daily Danelle Earthly, MD   25 mg at 05/22/23 0840   thiamine (VITAMIN B1) injection 100 mg  100 mg Intravenous Daily Candelaria Stagers T, MD   100 mg at 05/22/23 1610     Discharge Medications: Please see discharge summary for a list of discharge medications.  Relevant Imaging Results:  Relevant Lab Results:   Additional Information SSN: [id number].  Antion Felipa Emory, Student-Social Work

## 2023-05-22 NOTE — Progress Notes (Addendum)
 Recurrent aspiration pneumonia Patient is spiked fever 101 F.,  Tachycardic, O2 sat 92% on 2 L oxygen.  Patient has recent episode of aspiration pneumonia in the setting of stroke.  Completed 5 days course of IV Zosyn.  Given patient is febrile again checking CBC, blood culture, sputum culture and BMP. -Obtaining chest x-ray. - Continue supportive care include supplemental oxygen, Robitussin, supplemental oxygen, pulse ox.  Continue chest PT 3 times daily. -Per chart review previous blood and sputum culture from 3/13 no growth so far. -Starting IV Unasyn. -Continue aspiration precaution.  Update, - Chest x-ray showing progressive bilateral atelectasis.  Tereasa Coop, MD [facility] 05/22/2023, 9:54 PM

## 2023-05-22 NOTE — TOC Progression Note (Signed)
 Transition of Care [facility]) - Progression Note    Patient Details  Name: Tyler [name] MRN: [id number] Date of Birth: 1950/06/02  [facility] CM/SW Contact  Baldemar Lenis, [place] Phone Number: 05/22/2023, 1:56 PM  Clinical Narrative:   CSW spoke with daughter, Karena Addison, about disposition. Daughter said the ultimate plan is to rehab the patient well enough to get him back to [place], hopeful that patient may be a CIR candidate now that he is more alert. CSW explained that patient has not been a candidate previously, it is not felt that he could tolerate the intensity, but will have CIR take another look after PT today. CSW completed referral and faxed out for SNF, will follow with bed offers.    Expected Discharge Plan: Skilled Nursing Facility Barriers to Discharge: Continued Medical Work up, English as a second language teacher  Expected Discharge Plan and Services In-house Referral: Clinical Social Work   Post Acute Care Choice: IP Rehab Living arrangements for the past 2 months: Single Family Home                                       Social Determinants of Health (SDOH) Interventions SDOH Screenings   Food Insecurity: No Food Insecurity (04/22/2023)  Housing: Low Risk  (04/22/2023)  Transportation Needs: No Transportation Needs (04/22/2023)  Utilities: Not At Risk (04/22/2023)  Financial Resource Strain: Not on File (06/14/2021)   Received from [place], [place]  Physical Activity: Not on File (06/14/2021)   Received from [place], [place]  Social Connections: Patient Declined (04/22/2023)  Recent Concern: Social Connections - Moderately Isolated (04/07/2023)  Stress: Not on File (06/14/2021)   Received from [place], [place]  Tobacco Use: Low Risk  (05/19/2023)    Readmission Risk Interventions    04/23/2023   12:41 PM 03/07/2023    2:25 PM 08/22/2022    3:06 PM  Readmission Risk Prevention Plan  Transportation Screening Complete Complete Complete  Home Care Screening  Complete    Medication Review (RN CM)  Referral to Pharmacy   Medication Review (RN Care Manager) Complete  Complete  PCP or Specialist appointment within 3-5 days of discharge Complete  Complete  HRI or Home Care Consult Complete  Complete  SW Recovery Care/Counseling Consult Complete  Complete  Palliative Care Screening Not Applicable  Complete  Skilled Nursing Facility Not Applicable  Complete

## 2023-05-22 NOTE — Progress Notes (Signed)
 PROGRESS NOTE  Tyler [name] WGN:[id number] DOB: 10-20-1950   PCP: Nechama Guard, FNP  Patient is from: Home.  DOA: 04/22/2023 LOS: 30  Chief complaints No chief complaint on file.    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  Patient developed neck swelling, drooling, difficulty swallowing and altered mental status with concern for partial seizure.  Pulmonology consulted on 2/28.  He was intubated and admitted to ICU.  Core track placed on 3/1.  MRI brain without contrast on 3/11 showed resolving diffusion abnormality within the posterior right MCA territory and increased extent of hyperintense T2 weighted signal in right hemisphere concerning for progression of lymphoma or confluence of postischemic/postictal edema and lymphoma related vasogenic edema.  Eventually extubated on 3/2 but continued to have focal seizure.  He was reintubated and finally extubated on 3/14 again.  He was transferred to hospitalist service on 3/17.  Oncology and neurology involved but signed off.  Palliative medicine following.  G-tube placed by IR on 3/26.  Medically stable for discharge.   Subjective: Seen and examined earlier this morning.  No major events overnight of discomfort.  Notes yes to pain.  Points at his belly.  He had G-tube yesterday.  He is very dysarthric but oriented to self and place.  Objective: Vitals:   05/22/23 0414 05/22/23 0420 05/22/23 0741 05/22/23 1300  BP: 127/80  131/77 132/86  Pulse: 70  66 74  Resp:   20 20  Temp: 98.6 F (37 C)  98.7  F (37.1 C) 98.7 F (37.1 C)  TempSrc: Axillary  Oral Axillary  SpO2: 95%  96% 95%  Weight:  82.6 kg    Height:       Examination:  GENERAL: No apparent distress.  Nontoxic. HEENT: MMM.  Vision and hearing grossly intact.  NECK: Supple.  No apparent JVD.  RESP:  No IWOB.  Fair aeration bilaterally.  Rhonchi bilaterally. CVS:  RRR. Heart sounds normal.  ABD/GI/GU: BS+. Abd soft, NTND.  G-tube in place. MSK/EXT:   Cannot move left extremities. No edema.  SKIN: no apparent skin lesion or wound NEURO: Awake and alert.  Oriented to self and place.  Very dysarthric.  Left facial droop.  Left hemiparesis. PSYCH: Sleeping.  Consultants:  Hematology/oncology admitted patient Critical care Neurology Radiation oncology Palliative medicine Interventional radiology  Procedures: Intubation and mechanical ventilation. 3/26-G-tube placement  Microbiology summarized: 3/1-MRSA PCR screen nonreactive. 3/1-respiratory culture negative 3/1-a 20 pathogen RVP nonreactive 3/1-blood cultures NGTD 3/11-respiratory culture with abundant Pseudomonas aeruginosa 3/13-blood cultures NGTD   Assessment and plan: Acute ischemic stroke: MRI on 3/11 showed resolving diffusion abnormality within the posterior right MCA territory, and increased extent of hyperintense T2 weighted signal in the right hemisphere concerning for progression of lymphoma or postischemic edema.  CT angio head and neck without LVO or high-grade stenosis.  TTE on 3/1 without significant finding.  LDL 97.  A1c 5.2% on 2/12. -Appreciated by neurology-continue aspirin 81 mg per tube or 300 mg rectally -G-tube placed on 3/26. -PT recommending CIR.  OT to evaluate -SLP recommends n.p.o.  Focal seizure secondary to cerebral edema: LTM  EEG on 3/3 with clinical signs of seizures arising from right parietal region and evidence of epileptogenicity and cortical dysfunction arising from the right hemisphere.  Repeat LTM EEG on 3/20 with no  seizure but evidence of epileptogenicity and cortisol dysfunction arising from right hemisphere. -On Vimpat, Keppra and gabapentin per neurology. -Continue Decadron -Seizure precaution -Neurology signed off.  High-grade B-cell lymphoma with resection and recent chemo-primarily admitted second cycle of methotrexate and leucovorin -Oncology recommending goal of care discussion. -No clear role of radiation per radiation oncology. -Continue Decadron and Keppra. -Palliative medicine on board.  Dysphagia: Due to the above.  NPO.  Oral secretion improved with glycopyrrolate. -Was on tube feed via cortrack for almost 4 weeks -G-tube placed on 3/26.  -Continue glycopyrrolate 0.5 mg twice daily.  Possible aspiration pneumonia/pneumonitis: Respiratory culture with Pseudomonas. Completed IV Zosyn from 3/13-3/18.  Hypoxemia due to aspiration episode on 3/26. -Aspiration precaution -Elevate head of bed  Acute respiratory failure with hypoxia: He had acute hypoxemia, tachycardia and tachypnea after emesis the morning of 3/26.  Required NRB but was able to wean to 2 L by nasal cannula quickly.  X-ray with atelectasis.  Now on room air. -Aspiration precaution as above.  Paroxysmal atrial fibrillation: Rate controlled without meds.  Not on AC due to bleeding risk. -Optimize electrolytes   Previous PEs LLE DVT: No longer on AC.  Delirium: Has had fluctuating mental status. -Delirium precaution -Continue monitoring  Acute urinary retention: Bladder scan showed 650 cc on 3/24.  Foley inserted and had 750 cc amber clear urine right away.  Has had prostatectomy for prostate cancer. -Continue Foley catheter.  I do not know if he can pass voiding trial since he is bedridden.   Prostate cancer s/p prostatectomy   Hepatitis B -Continue tenofovir 25 daily  Advance care planning: Extensive discussion with patient's daughter, Karena Addison over the phone.  Remains full code with full scope of care.  Palliative  medicine on board.   Class I obesity Body mass index is 28.51 kg/m. Nutrition Problem: Inadequate oral intake Etiology:  (seizures) Signs/Symptoms: NPO status Interventions: Tube feeding, Prostat   DVT prophylaxis:  enoxaparin (LOVENOX) injection 40 mg Start: 05/22/23 1000 SCDs Start: 04/25/23 2209  Code Status: Full code Family Communication: Updated patient's daughter, Denny Peon over the phone. Level of care: Telemetry Medical Status is: Inpatient Remains inpatient appropriate because: Placement.   Final disposition: CIR?   55 minutes with more than 50% spent in reviewing records, counseling patient/family and coordinating care.   Sch Meds:  Scheduled Meds:  aspirin  81 mg Per Tube Daily   Or   aspirin  300 mg Rectal Daily   Chlorhexidine Gluconate Cloth  6 each Topical Nightly   cholecalciferol  2,000 Units Per Tube Daily   vitamin B-12  1,000 mcg Per Tube Daily   dexamethasone  4 mg Per Tube Q12H   Or   dexamethasone (DECADRON) injection  4 mg Intravenous Q12H   enoxaparin (LOVENOX) injection  40 mg Subcutaneous Q24H   feeding supplement (PROSource TF20)  60 mL Per Tube BID   fiber supplement (BANATROL TF)  60 mL Per Tube TID   folic acid  1 mg Per Tube Daily   gabapentin  100 mg Per Tube Q8H   glycopyrrolate  0.5 mg Per Tube BID   Or   glycopyrrolate  0.1 mg Intravenous BID   insulin aspart  0-15 Units Subcutaneous Q4H   lacosamide  200 mg Per Tube Q12H   levETIRAcetam  2,000 mg Per Tube BID   lidocaine  15 mL Mouth/Throat Once   metoCLOPramide (REGLAN) injection  5 mg Intravenous Q12H   sodium chloride flush  10-40 mL Intracatheter Q12H   sodium chloride flush  3-10 mL Intravenous Q12H   tenofovir alafenamide  25 mg Per Tube Daily   thiamine (VITAMIN B1) injection  100 mg Intravenous Daily   Continuous Infusions:  feeding supplement (OSMOLITE 1.5 CAL) 1,000 mL (05/22/23 0858)   lacosamide (VIMPAT) IV 200 mg (05/21/23 2247)   levETIRAcetam 2,000 mg (05/21/23  2336)   PRN Meds:.acetaminophen, alum & mag hydroxide-simeth, hydrALAZINE, hydrocortisone, lidocaine, LORazepam, magic mouthwash w/lidocaine, morphine injection, naLOXone (NARCAN)  injection, ondansetron (ZOFRAN) IV, mouth rinse, polyethylene glycol, senna-docusate, sodium chloride flush, sodium chloride flush  Antimicrobials: Anti-infectives (From admission, onward)    Start     Dose/Rate Route Frequency Ordered Stop   05/21/23 0900  ceFAZolin (ANCEF) IVPB 2g/100 mL premix        2 g 200 mL/hr over 30 Minutes Intravenous  Once 05/21/23 0804 05/21/23 0900   05/09/23 1000  tenofovir alafenamide (VEMLIDY) tablet 25 mg        25 mg Per Tube Daily 05/08/23 1135     05/08/23 1245  piperacillin-tazobactam (ZOSYN) IVPB 3.375 g        3.375 g 12.5 mL/hr over 240 Minutes Intravenous Every 8 hours 05/08/23 1153 05/13/23 0955   05/08/23 1000  tenofovir (VIREAD) tablet 300 mg  Status:  Discontinued        300 mg Per Tube Daily 05/07/23 1143 05/08/23 1135   05/07/23 2300  vancomycin (VANCOCIN) IVPB 1000 mg/200 mL premix  Status:  Discontinued        1,000 mg 200 mL/hr over 60 Minutes Intravenous Every 12 hours 05/07/23 1218 05/08/23 1401   05/07/23 1015  vancomycin (VANCOREADY) IVPB 1750 mg/350 mL        1,750 mg 175 mL/hr over 120 Minutes Intravenous  Once 05/07/23 0921 05/07/23 1236   05/07/23 1015  ceFEPIme (MAXIPIME) 2 g in sodium chloride 0.9 % 100 mL IVPB  Status:  Discontinued        2 g 200 mL/hr over 30 Minutes Intravenous Every 8 hours 05/07/23 0921 05/08/23 1153   04/28/23 1000  tenofovir alafenamide (VEMLIDY) tablet 25 mg  Status:  Discontinued        25 mg Per Tube Daily 04/27/23 2309 05/07/23 1143   04/23/23 1000  tenofovir alafenamide (VEMLIDY) tablet 25 mg  Status:  Discontinued        25 mg Oral Daily 04/22/23 1520 04/27/23 2309        I have personally reviewed the following labs and images: CBC: Recent Labs  Lab 05/16/23 0521 05/20/23 0535 05/21/23 0446 05/22/23 0600   WBC 7.9 6.9 8.4 7.7  HGB 10.9* 11.6* 13.5 11.5*  HCT 33.1* 34.4* 39.8 34.7*  MCV 89.9 88.7 88.1 89.9  PLT 228 201 217 148*   BMP &GFR Recent Labs  Lab 05/16/23 0521 05/20/23 0535 05/21/23 0446 05/22/23 0600  NA 134* 137 137 139  K 4.1 4.0 3.8 3.6  CL 101 105 104 106  CO2 28 26 22 26   GLUCOSE 133* 99 118* 130*  BUN 21 26* 31* 21  CREATININE 0.68 0.75 0.86 0.67  CALCIUM 8.0* 8.3* 8.8* 8.3*  MG 1.8 1.9 1.9 1.9  PHOS 4.1 4.8* 5.3* 3.5   Estimated Creatinine Clearance: 87.1 mL/min (by C-G formula based on SCr of 0.67 mg/dL). Liver & Pancreas: Recent  Labs  Lab 05/16/23 0521 05/20/23 0535 05/21/23 0446 05/22/23 0600  AST  --  25 33  --   ALT  --  53* 76*  --   ALKPHOS  --  60 78  --   BILITOT  --  0.6 0.7  --   PROT  --  5.1* 6.2*  --   ALBUMIN 2.4* 2.4* 2.9* 2.2*   No results for input(s): "LIPASE", "AMYLASE" in the last 168 hours. No results for input(s): "AMMONIA" in the last 168 hours. Diabetic: No results for input(s): "HGBA1C" in the last 72 hours. Recent Labs  Lab 05/22/23 0010 05/22/23 0418 05/22/23 0743 05/22/23 1154 05/22/23 1520  GLUCAP 135* 120* 136* 153* 141*   Cardiac Enzymes: No results for input(s): "CKTOTAL", "CKMB", "CKMBINDEX", "TROPONINI" in the last 168 hours. No results for input(s): "PROBNP" in the last 8760 hours. Coagulation Profile: Recent Labs  Lab 05/20/23 0905  INR 1.2   Thyroid Function Tests: No results for input(s): "TSH", "T4TOTAL", "FREET4", "T3FREE", "THYROIDAB" in the last 72 hours. Lipid Profile: No results for input(s): "CHOL", "HDL", "LDLCALC", "TRIG", "CHOLHDL", "LDLDIRECT" in the last 72 hours.  Anemia Panel: No results for input(s): "VITAMINB12", "FOLATE", "FERRITIN", "TIBC", "IRON", "RETICCTPCT" in the last 72 hours. Urine analysis:    Component Value Date/Time   COLORURINE YELLOW 05/08/2023 1632   APPEARANCEUR HAZY (A) 05/08/2023 1632   LABSPEC >1.046 (H) 05/08/2023 1632   PHURINE 6.0 05/08/2023 1632    GLUCOSEU NEGATIVE 05/08/2023 1632   HGBUR NEGATIVE 05/08/2023 1632   BILIRUBINUR NEGATIVE 05/08/2023 1632   KETONESUR NEGATIVE 05/08/2023 1632   PROTEINUR NEGATIVE 05/08/2023 1632   NITRITE NEGATIVE 05/08/2023 1632   LEUKOCYTESUR NEGATIVE 05/08/2023 1632   Sepsis Labs: Invalid input(s): "PROCALCITONIN", "LACTICIDVEN"  Microbiology: No results found for this or any previous visit (from the past 240 hours).   Radiology Studies: No results found.       Kito Cuffe T. Caedon [facility]  If 7PM-7AM, please contact night-coverage [email or web address] 05/22/2023, 3:44 PM

## 2023-05-22 NOTE — Progress Notes (Signed)
 Pt temp 101.3, Tylenol given, last time he had temp over 100, was 5 days ago.  Along withi fever his HR is in 110s-120s and RR in upper 20s which results in red MEWS.  He is in mild respiratory distress, still maintaining his sats at 92% on 2 L of O2.  Coughing up very thick secretion.  Janalyn Shy, MD made aware.  New orders placed.

## 2023-05-22 NOTE — Progress Notes (Signed)
 Physical Therapy Treatment Patient Details Name: Tyler [name] MRN: [id number] DOB: 21-Oct-1950 Today's Date: 05/22/2023   History of Present Illness 73 yo male presents on 2/25 for second cycle of high dose methotrexate with leucovorin rescue. 2/27 MRI showed marked interval decrease in size of right parietal mass/lymphoma and new diffusion signal abnormality involving the right insular cortex and overlying right frontal lobe, favored to reflect sequelae of seizure. 2/28 transferred to ICU and intubated. 3/2 extubated. 3/8 with seizures and need for airway protection so re-intubated and continued to have breakthrough seizures; extubated 3/14. EEG with evidence of epileptogenicity arising from R hemisphere, maximal R parietal region and cortical dysfunction in R hemisphere. PMH: CNS relapse of large B-cell lymphoma, partial seizure, paroxysmal atrial fib not on anticoagulation due to bleeding risk, prostate cancer status post prostatectomy 2021, pulmonary embolism.    PT Comments  Pt continues at Total A for bed mobility. Unable to get to standing this session due to heavy posterior lean in sitting requiring Total A with brief periods of Mod A. Pt has supportive family at home. Due to pt current functional status, home set up and available assistance at home recommending skilled physical therapy services > 3 hours/day in order to address strength, balance and functional mobility to decrease risk for falls, injury, immobility, skin break down and re-hospitalization.      If plan is discharge home, recommend the following: Assistance with cooking/housework;Assist for transportation;Help with stairs or ramp for entrance;Two people to help with walking and/or transfers   Can travel by private vehicle     No  Equipment Recommendations  Wheelchair (measurements PT);Wheelchair cushion (measurements PT);Hoyer lift;Hospital bed       Precautions / Restrictions Precautions Precautions: Fall;Other  (comment) Recall of Precautions/Restrictions: Impaired Precaution/Restrictions Comments: cortrak Restrictions Weight Bearing Restrictions Per Provider Order: No     Mobility  Bed Mobility Overal bed mobility: Needs Assistance Bed Mobility: Supine to Sit, Sit to Supine     Supine to sit: +2 for physical assistance, HOB elevated, Total assist Sit to supine: +2 for physical assistance, Total assist   General bed mobility comments: heavy lifting help for trunk and pt initiated moving R leg off EOB though did not continue; to supine assist for legs up. Heavy posteriore lean requiring Total A to occasional Mod A    Modified Rankin (Stroke Patients Only) Modified Rankin (Stroke Patients Only) Pre-Morbid Rankin Score: Moderate disability Modified Rankin: Severe disability     Balance Overall balance assessment: Needs assistance Sitting-balance support: Feet supported Sitting balance-Leahy Scale: Zero Sitting balance - Comments: Total A for sitting EOB with brief periods of Mod A with multi modal cueing Postural control: Posterior lean        Communication Communication Communication: Impaired Factors Affecting Communication: Difficulty expressing self;Reduced clarity of speech  Cognition Arousal: Alert Behavior During Therapy: Flat affect   PT - Cognitive impairments: Difficult to assess       Following commands: Impaired Following commands impaired: Follows one step commands inconsistently, Follows one step commands with increased time    Cueing Cueing Techniques: Verbal cues, Gestural cues, Visual cues, Tactile cues     General Comments General comments (skin integrity, edema, etc.): No noted skin issues. Vital signs stable throughout.      Pertinent Vitals/Pain Pain Assessment Pain Assessment: Faces Faces Pain Scale: No hurt Breathing: normal Negative Vocalization: none Facial Expression: smiling or inexpressive Body Language: relaxed Consolability: no need to  console PAINAD Score: 0 Pain Intervention(s): Monitored during session  PT Goals (current goals can now be found in the care plan section) Acute Rehab PT Goals Patient Stated Goal: none stated PT Goal Formulation: Patient unable to participate in goal setting Time For Goal Achievement: 05/24/23 Potential to Achieve Goals: Fair Progress towards PT goals: Progressing toward goals    Frequency    Min 3X/week      PT Plan  Continue with current POC        AM-PAC PT "6 Clicks" Mobility   Outcome Measure  Help needed turning from your back to your side while in a flat bed without using bedrails?: Total Help needed moving from lying on your back to sitting on the side of a flat bed without using bedrails?: Total Help needed moving to and from a bed to a chair (including a wheelchair)?: Total Help needed standing up from a chair using your arms (e.g., wheelchair or bedside chair)?: Total Help needed to walk in hospital room?: Total Help needed climbing 3-5 steps with a railing? : Total 6 Click Score: 6    End of Session Equipment Utilized During Treatment: Oxygen Activity Tolerance: Other (comment) (pt limited by cognitive status) Patient left: in bed;with call bell/phone within reach;with bed alarm set Nurse Communication: Mobility status PT Visit Diagnosis: Other abnormalities of gait and mobility (R26.89);Other symptoms and signs involving the nervous system (R29.898);Muscle weakness (generalized) (M62.81)     Time: 1610-9604 PT Time Calculation (min) (ACUTE ONLY): 23 min  Charges:    $Therapeutic Activity: 23-37 mins PT General Charges $$ ACUTE PT VISIT: 1 Visit                     Harrel Carina, DPT, CLT  Acute Rehabilitation Services Office: [phone] (Secure chat preferred)    Claudia Desanctis 05/22/2023, 2:25 PM

## 2023-05-22 NOTE — Evaluation (Addendum)
 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/2022   Acute renal failure with tubular necrosis (HCC) 08/08/2022   Aspiration into airway 08/08/2022   At high risk of tumor lysis syndrome 08/07/2022   Anemia 08/06/2022   Diffuse large B-cell lymphoma of lymph nodes of multiple regions (HCC) 07/25/2022   Encounter for antineoplastic chemotherapy 07/25/2022   Lumbar radiculopathy 05/31/2022   Symptomatic anemia    UTI (urinary tract infection) 12/10/2021   Septic shock (HCC) 12/10/2021   Normocytic anemia 12/10/2021   Thrombocytopenia (HCC) 12/10/2021   Hyponatremia 12/10/2021   Sepsis secondary to UTI (HCC) 12/10/2021   Pelvic hematoma in male 09/08/2019   Bilateral pulmonary embolism (HCC) 09/05/2019   Paroxysmal atrial fibrillation (HCC) 09/05/2019   Hypertension    Hypokalemia    Prostate  cancer (HCC) 09/01/2019   Past Medical History:  Past Medical History:  Diagnosis Date   Cancer (HCC)    prostate   Follicular lymphoma (HCC) 2024   History of pulmonary embolism    Hypertension    Myelodysplastic syndrome (HCC)    PAF (paroxysmal atrial fibrillation) (HCC)    Recurrent UTI    Past Surgical History:  Past Surgical History:  Procedure Laterality Date   APPLICATION OF CRANIAL NAVIGATION Right 03/10/2023   Procedure: APPLICATION OF CRANIAL NAVIGATION;  Surgeon: Tressie Stalker, MD;  Location: [facility] OR;  Service: Neurosurgery;  Laterality: Right;   BIOPSY  08/21/2022   Procedure: BIOPSY;  Surgeon: Beverley Fiedler, MD;  Location: [facility] ENDOSCOPY;  Service: Gastroenterology;;   CRANIOTOMY Right 03/10/2023   Procedure: PARIETAL CRANIOTOMY;  Surgeon: Tressie Stalker, MD;  Location: [facility] OR;  Service: Neurosurgery;  Laterality: Right;   ESOPHAGOGASTRODUODENOSCOPY (EGD) WITH PROPOFOL N/A 08/21/2022   Procedure: ESOPHAGOGASTRODUODENOSCOPY (EGD) WITH PROPOFOL;  Surgeon: Beverley Fiedler, MD;  Location: [facility] ENDOSCOPY;  Service: Gastroenterology;  Laterality: N/A;   IR GASTROSTOMY TUBE MOD SED  05/21/2023   IR IMAGING GUIDED PORT INSERTION  08/26/2022   LYMPHADENECTOMY Bilateral 09/01/2019   Procedure: LYMPHADENECTOMY;  Surgeon: Sebastian Ache, MD;  Location: [facility] ORS;  Service: Urology;  Laterality: Bilateral;   ROBOT ASSISTED LAPAROSCOPIC RADICAL PROSTATECTOMY N/A 09/01/2019   Procedure: XI ROBOTIC ASSISTED LAPAROSCOPIC RADICAL PROSTATECTOMY;  Surgeon: Sebastian Ache, MD;  Location: [facility] ORS;  Service: Urology;  Laterality: N/A;  3 HRS   HPI:  73 yo male presents to [facility] on 2/10 with LUE and LE weakness, and change in mentation. Head CT of brain shows a recurrent  tumor at the craniotomy site and increased size of recurrent tumor along with a left midline shift up to 6 mm.  Hospital course complicated by concern for neck swelling, acute dysphagia, and concern for airway protection prompting intubation  from 02/28-03/02. Neck CT on 04/26/23: demonstrated "No acute finding. Assessment of mucosal surfaces limited by airway collapse in the setting of intubation." MBS 3/3 showed an oral more than pharyngeal dysphagia although with silent aspiration x1 with thin liquids. Aspiration risk was felt to be mostly related to impulsivity/mentation, and he did progress from full liquid to pureed diet. Seizure activity increased though resulting in reintubation 3/8-3/14. Pt's swallowing was previously evaluated when he was admitted in June of 2024 - he was D/Cd on a dysphagia 3 diet with thin liquids with no f/u recommended. Pt's swallowing assessed again on 04/08/23 this admission with recommendation of a regular diet and thin liquids.  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   Assessment / Plan / Recommendation Clinical Impression  Left handed pt reassessed for speech-language-cognitive evaluation. Initially evaluated 3/3 with nonfluent, mixed expressive and receptive language deficits consistent with aphasia. Pt indicated with head nod that he understands and speaks [place]. He presently exhibits severe dysarthria making it difficult to determine language abilities expecially expressive. He had brief instances of spontanous phrases that were unintelligible mostly vowel sounds. He was able to imitate vowel sounds with good approximation and for words would approximate the first phoneme and approximated "1", "2" during counting. MD arrived and pt able to approximate "hospital" with distortions. He could not name simple objects but opened mouth only in attempts without phonation or articulatory movement. Comprehension for simple information was good and followed one step commands and  biographical/environmental yes/no (head gestures) with 100%. Cognitively his attention waxed and waned and therapist will continue to assess in therapy. Pt may have expressive language deficits and SLP will continue to assess language abilities in diagnostic therapy as able given severe dysarthria.    SLP Assessment  SLP Recommendation/Assessment: Patient needs continued Speech Lanaguage Pathology Services SLP Visit Diagnosis: Dysarthria and anarthria (R47.1);Cognitive communication deficit (R41.841)    Recommendations for follow up therapy are one component of a multi-disciplinary discharge planning process, led by the attending physician.  Recommendations may be updated based on patient status, additional functional criteria and insurance authorization.    Follow Up Recommendations  Skilled nursing-short term rehab (<3 hours/day)    Assistance Recommended at Discharge  Frequent or constant Supervision/Assistance  Functional Status Assessment Patient has had a recent decline in their functional status and demonstrates the ability to make significant improvements in function in a reasonable and predictable amount of time.  Frequency and Duration min 2x/week  2 weeks      SLP Evaluation Cognition  Overall Cognitive Status: Impaired/Different from baseline Arousal/Alertness: Awake/alert Orientation Level: Oriented to person;Oriented to place Attention: Sustained Sustained Attention: Impaired Sustained Attention Impairment: Verbal basic Memory:  (TBA) Awareness:  (unable to fully assess- will assess in therapy, didn't appear frustrated by dysathria) Problem Solving:  (TBD) Safety/Judgment: Impaired       Comprehension  Auditory Comprehension Overall Auditory Comprehension: Appears within functional limits for tasks assessed (fior basic info- higher level not assessed at this time) Yes/No Questions: Within Functional Limits (100%) Commands: Within Functional Limits One Step Basic  Commands: 75-100%  accurate (100%) Conversation:  (N/A) Visual Recognition/Discrimination Discrimination: Not tested Reading Comprehension Reading Status: Not tested    Expression Expression Primary Mode of Expression: Verbal Verbal Expression Overall Verbal Expression: Impaired Initiation: Impaired Automatic Speech:  (see impression statement) Level of Generative/Spontaneous Verbalization: Word Repetition: Impaired Level of Impairment: Word level (aprox 33% acc) Naming:  (unable to name pen -likely due to dysarthria) Pragmatics: Impairment Impairments: Eye contact Interfering Components: Attention Written Expression Dominant Hand: Left Written Expression: Not tested   Oral / Motor  Oral Motor/Sensory Function Overall Oral Motor/Sensory Function: Severe impairment Facial ROM: Reduced left;Suspected CN VII (facial) dysfunction Facial Symmetry: Suspected CN VII (facial) dysfunction;Abnormal symmetry left Facial Strength: Suspected CN VII (facial) dysfunction;Reduced left Lingual Symmetry: Abnormal symmetry left;Suspected CN XII (hypoglossal) dysfunction Motor Speech Overall Motor Speech: Impaired Respiration: Impaired Level of Impairment: Word Phonation: Low vocal intensity Resonance: Hyponasality Articulation: Impaired Level of Impairment: Word Intelligibility: Intelligibility reduced Word: 0-24% accurate Phrase: 0-24% accurate Motor Planning: Witnin functional limits Motor Speech Errors: Not applicable            Royce Macadamia 05/22/2023, 10:51 AM

## 2023-05-22 NOTE — Progress Notes (Signed)
 Referring Provider(s): Johney Maine  Supervising Physician: Marliss Coots  Patient Status:  [facility] - In-pt  Chief Complaint:  High grade B cell lymphoma; possible aspiration pneumonia; acute ischemic stroke; dysphagia - s/p image guided percutaneous gastrostomy tube placement 05/21/23 Dr. Miles Costain   Subjective:  Pt resting in bed with eyes closed, responded to physical stimuli.    Allergies: Amitriptyline, Amlodipine, and Neurontin [gabapentin]  Medications: Prior to Admission medications   Medication Sig Start Date End Date Taking? Authorizing Provider  B Complex Vitamins (VITAMIN B COMPLEX) TABS Take 1 tablet by mouth daily. Patient taking differently: Take 1 tablet by mouth in the morning. 12/21/21  Yes Amin, Ankit C, MD  cholecalciferol (VITAMIN D3) 25 MCG (1000 UNIT) tablet Take 2 tablets (2,000 Units total) by mouth daily. Patient taking differently: Take 1,000 Units by mouth in the morning. 01/21/22  Yes Johney Maine, MD  dexamethasone (DECADRON) 4 MG tablet Take 1 tablet (4 mg total) by mouth 2 (two) times daily with breakfast and lunch. 04/14/23  Yes Johney Maine, MD  folic acid (FOLVITE) 1 MG tablet Take 1 mg by mouth in the morning. 03/27/22  Yes [provider]  HYDROcodone-acetaminophen (NORCO/VICODIN) 5-325 MG tablet Take 1 tablet by mouth every 6 (six) hours as needed for moderate pain (pain score 4-6). 03/17/23  Yes Val Eagle D, NP  levETIRAcetam (KEPPRA) 500 MG tablet Take 1 tablet (500 mg total) by mouth 2 (two) times daily. 04/14/23  Yes Johney Maine, MD  metoprolol succinate (TOPROL XL) 25 MG 24 hr tablet Take 1 tablet (25 mg total) by mouth daily. Patient taking differently: Take 25 mg by mouth in the morning. 12/30/22  Yes O'Neal, Ronnald Ramp, MD  pantoprazole (PROTONIX) 40 MG tablet Take 1 tablet (40 mg total) by mouth 2 (two) times daily. For 12 weeks 04/16/23 07/15/23 Yes Johney Maine, MD  VEMLIDY 25 MG tablet  TAKE 1 TABLET BY MOUTH 1 TIME A DAY Patient taking differently: Take 25 mg by mouth in the morning. 04/01/23  Yes Danelle Earthly, MD  docusate sodium (COLACE) 100 MG capsule Take 1 capsule (100 mg total) by mouth 2 (two) times daily. Patient not taking: Reported on 04/22/2023 03/17/23   Val Eagle D, NP  furosemide (LASIX) 20 MG tablet Take 1 tablet (20 mg total) by mouth daily. Patient not taking: Reported on 04/22/2023 12/30/22   Sande Rives, MD  lidocaine-prilocaine (EMLA) cream Apply to affected area once Patient not taking: Reported on 04/22/2023 09/18/22   Georga Kaufmann T, PA-C  Menthol-Methyl Salicylate (MUSCLE RUB) 10-15 % CREA Apply 1 Application topically daily as needed for muscle pain. Patient not taking: Reported on 04/22/2023    [provider]  ondansetron (ZOFRAN) 8 MG tablet Take 1 tablet (8 mg total) by mouth every 8 (eight) hours as needed for nausea or vomiting. Start on the third day after cyclophosphamide chemotherapy. Patient not taking: Reported on 04/22/2023 07/26/22   Johney Maine, MD     Vital Signs: BP 131/77 (BP Location: Left Arm)   Pulse 66   Temp 98.7 F (37.1 C) (Oral)   Resp 20   Ht 5' 7.01" (1.702 m)   Wt 182 lb 1.6 oz (82.6 kg)   SpO2 96%   BMI 28.51 kg/m   Physical Exam Constitutional:      Appearance: Normal appearance.  Skin:    Comments: G tube site clean, dry, without signs of infection, no redness  Stitching intact  No drainage from g tube site   Neurological:     Mental Status: Mental status is at baseline.  Psychiatric:        Mood and Affect: Mood normal.        Behavior: Behavior normal.    Labs:  CBC: Recent Labs    05/16/23 0521 05/20/23 0535 05/21/23 0446 05/22/23 0600  WBC 7.9 6.9 8.4 7.7  HGB 10.9* 11.6* 13.5 11.5*  HCT 33.1* 34.4* 39.8 34.7*  PLT 228 201 217 148*    COAGS: Recent Labs    07/29/22 1520 04/07/23 1016 05/20/23 0905  INR 1.5* 1.2 1.2  APTT 28  --   --      BMP: Recent Labs    05/16/23 0521 05/20/23 0535 05/21/23 0446 05/22/23 0600  NA 134* 137 137 139  K 4.1 4.0 3.8 3.6  CL 101 105 104 106  CO2 28 26 22 26   GLUCOSE 133* 99 118* 130*  BUN 21 26* 31* 21  CALCIUM 8.0* 8.3* 8.8* 8.3*  CREATININE 0.68 0.75 0.86 0.67  GFRNONAA >60 >60 >60 >60    LIVER FUNCTION TESTS: Recent Labs    05/12/23 1017 05/15/23 0718 05/16/23 0521 05/20/23 0535 05/21/23 0446 05/22/23 0600  BILITOT 0.6 0.4  --  0.6 0.7  --   AST 34 24  --  25 33  --   ALT 77* 59*  --  53* 76*  --   ALKPHOS 66 60  --  60 78  --   PROT 5.6* 5.2*  --  5.1* 6.2*  --   ALBUMIN 2.7* 2.4* 2.4* 2.4* 2.9* 2.2*    Assessment and Plan:  Pt with high grade B cell lymphoma, possible aspiration pneumonia, acute ischemic stroke, and dysphagia s/p image guided percutaneous gastrostomy tube placement 05/21/23 with Dr. Miles Costain.    Pt seen today resting in bed, unable to respond to questions.  Pt did open his eyes while I was checking the site, but was unable to answer when asked about pain.    G tube site is clean, dry, stitches intact, no redness, discharge, or signs of infection.    Please call IR with any questions.    Electronically Signed: Loman Brooklyn, PA-C 05/22/2023, 11:18 AM    I spent a total of 25 Minutes at the the patient's bedside AND on the patient's hospital floor or unit, greater than 50% of which was counseling/coordinating care for  image guided percutaneous gastrostomy tube placement.

## 2023-05-23 DIAGNOSIS — C8338 Diffuse large B-cell lymphoma, lymph nodes of multiple sites: Secondary | ICD-10-CM | POA: Diagnosis not present

## 2023-05-23 DIAGNOSIS — C8339 Primary central nervous system lymphoma: Secondary | ICD-10-CM | POA: Diagnosis not present

## 2023-05-23 DIAGNOSIS — Z7189 Other specified counseling: Secondary | ICD-10-CM | POA: Diagnosis not present

## 2023-05-23 DIAGNOSIS — R569 Unspecified convulsions: Secondary | ICD-10-CM | POA: Diagnosis not present

## 2023-05-23 LAB — EXPECTORATED SPUTUM ASSESSMENT W GRAM STAIN, RFLX TO RESP C: Special Requests: NORMAL

## 2023-05-23 LAB — GLUCOSE, CAPILLARY
Glucose-Capillary: 185 mg/dL — ABNORMAL HIGH (ref 70–99)
Glucose-Capillary: 142 mg/dL — ABNORMAL HIGH (ref 70–99)

## 2023-05-23 MED ORDER — POLYVINYL ALCOHOL 1.4 % OP SOLN
1.0000 [drp] | Freq: Four times a day (QID) | OPHTHALMIC | Status: DC | PRN
  Filled 2023-05-23: qty 15

## 2023-05-23 MED ORDER — ONDANSETRON 4 MG PO TBDP: 4.0000 mg | ORAL_TABLET | Freq: Four times a day (QID) | ORAL | Status: DC | PRN

## 2023-05-23 MED ORDER — LORAZEPAM 2 MG/ML IJ SOLN: 2.0000 mg | INTRAMUSCULAR | Status: DC | PRN

## 2023-05-23 MED ORDER — ONDANSETRON HCL 4 MG/2ML IJ SOLN: 4.0000 mg | Freq: Four times a day (QID) | INTRAMUSCULAR | Status: DC | PRN

## 2023-05-23 MED ORDER — LORAZEPAM 2 MG/ML PO CONC: 1.0000 mg | ORAL | Status: DC | PRN

## 2023-05-23 MED ORDER — GLYCOPYRROLATE 0.2 MG/ML IJ SOLN
0.2000 mg | INTRAMUSCULAR | Status: DC | PRN
  Administered 2023-05-23 – 2023-05-24 (×3): 0.2 mg via INTRAVENOUS
  Filled 2023-05-23 (×3): qty 1

## 2023-05-23 MED ORDER — GLYCOPYRROLATE 1 MG PO TABS: 1.0000 mg | ORAL_TABLET | ORAL | Status: DC | PRN

## 2023-05-23 MED ORDER — MORPHINE SULFATE (PF) 2 MG/ML IV SOLN
1.0000 mg | INTRAVENOUS | Status: DC | PRN
  Administered 2023-05-23: 1 mg via INTRAVENOUS
  Filled 2023-05-23: qty 1

## 2023-05-23 MED ORDER — LORAZEPAM 2 MG/ML IJ SOLN: 1.0000 mg | INTRAMUSCULAR | Status: DC | PRN

## 2023-05-23 MED ORDER — HALOPERIDOL LACTATE 2 MG/ML PO CONC
0.5000 mg | ORAL | Status: DC | PRN
  Filled 2023-05-23: qty 5

## 2023-05-23 MED ORDER — LORAZEPAM 1 MG PO TABS: 1.0000 mg | ORAL_TABLET | ORAL | Status: DC | PRN

## 2023-05-23 MED ORDER — HALOPERIDOL LACTATE 5 MG/ML IJ SOLN: 0.5000 mg | INTRAMUSCULAR | Status: DC | PRN

## 2023-05-23 MED ORDER — LEVETIRACETAM IN NACL 1000 MG/100ML IV SOLN
1000.0000 mg | Freq: Two times a day (BID) | INTRAVENOUS | Status: DC
  Administered 2023-05-23 – 2023-05-24 (×2): 1000 mg via INTRAVENOUS
  Filled 2023-05-23 (×2): qty 100

## 2023-05-23 MED ORDER — GLYCOPYRROLATE 0.2 MG/ML IJ SOLN: 0.2000 mg | INTRAMUSCULAR | Status: DC | PRN

## 2023-05-23 MED ORDER — SODIUM CHLORIDE 0.9 % IV SOLN
200.0000 mg | Freq: Two times a day (BID) | INTRAVENOUS | Status: DC
  Administered 2023-05-23 – 2023-05-24 (×3): 200 mg via INTRAVENOUS
  Filled 2023-05-23 (×4): qty 20

## 2023-05-23 MED ORDER — HALOPERIDOL 0.5 MG PO TABS: 0.5000 mg | ORAL_TABLET | ORAL | Status: DC | PRN

## 2023-05-23 MED ORDER — DEXAMETHASONE SODIUM PHOSPHATE 4 MG/ML IJ SOLN
4.0000 mg | Freq: Two times a day (BID) | INTRAMUSCULAR | Status: DC
  Administered 2023-05-23 – 2023-05-24 (×3): 4 mg via INTRAVENOUS
  Filled 2023-05-23 (×3): qty 1

## 2023-05-23 NOTE — Plan of Care (Signed)
  Problem: Education: Goal: Knowledge of the prescribed therapeutic regimen will improve 05/23/2023 2051 by Debbrah Alar, RN Outcome: Not Progressing 05/23/2023 2051 by Velia Meyer D, RN Outcome: Not Progressing   Problem: Coping: Goal: Ability to identify and develop effective coping behavior will improve 05/23/2023 2051 by Debbrah Alar, RN Outcome: Not Progressing 05/23/2023 2051 by Debbrah Alar, RN Outcome: Not Progressing   Problem: Clinical Measurements: Goal: Quality of life will improve 05/23/2023 2051 by Debbrah Alar, RN Outcome: Not Progressing 05/23/2023 2051 by Velia Meyer D, RN Outcome: Not Progressing   Problem: Respiratory: Goal: Verbalizations of increased ease of respirations will increase 05/23/2023 2051 by Debbrah Alar, RN Outcome: Not Progressing 05/23/2023 2051 by Velia Meyer D, RN Outcome: Not Progressing   Problem: Role Relationship: Goal: Family's ability to cope with current situation will improve 05/23/2023 2051 by Debbrah Alar, RN Outcome: Not Progressing 05/23/2023 2051 by Debbrah Alar, RN Outcome: Not Progressing Goal: Ability to verbalize concerns, feelings, and thoughts to partner or family member will improve 05/23/2023 2051 by Debbrah Alar, RN Outcome: Not Progressing 05/23/2023 2051 by Debbrah Alar, RN Outcome: Not Progressing

## 2023-05-23 NOTE — Progress Notes (Signed)
 PROGRESS NOTE  Tyler [name] ZOX:[id number] DOB: May 03, 1950   PCP: Nechama Guard, FNP  Patient is from: Home.  DOA: 04/22/2023 LOS: 31  Chief complaints No chief complaint on file.    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  Patient developed neck swelling, drooling, difficulty swallowing and altered mental status with concern for partial seizure.  Pulmonology consulted on 2/28.  He was intubated and admitted to ICU.  Core track placed on 3/1.  MRI brain without contrast on 3/11 showed resolving diffusion abnormality within the posterior right MCA territory and increased extent of hyperintense T2 weighted signal in right hemisphere concerning for progression of lymphoma or confluence of postischemic/postictal edema and lymphoma related vasogenic edema.  Eventually extubated on 3/2 but continued to have focal seizure.  He was reintubated and finally extubated on 3/14 again.  He was transferred to hospitalist service on 3/17.  Oncology and neurology involved but signed off.  G-tube placed by IR on 3/26.  Patient was waiting on SNF bed but family decided to transition him to full comfort care on 05/23/2023.  Waiting on residential hospice.  Subjective: Seen and examined earlier this morning.  Had a fever to 101 with tachycardia and tachypnea last night.  CXR with increased atelectasis bilaterally.  Cultures obtained and he was started on IV Unasyn.  Patient's daughter, Karena Addison visited this morning and decided to transition him to full comfort care so he can  go to residential hospice.  TOC consulted.   Objective: Vitals:   05/23/23 0500 05/23/23 0556 05/23/23 0831 05/23/23 1228  BP:   125/82   Pulse:   80   Resp:  (!) 25 17 (!) 26  Temp:   98.6 F (37 C)   TempSrc:   Oral   SpO2:  96% 91% 93%  Weight: 82.3 kg     Height:       Examination:  GENERAL: No apparent distress.  Nontoxic. HEENT: MMM.  Vision and hearing grossly intact.  NECK: Supple.  No apparent JVD.  RESP:  No IWOB.  Fair aeration bilaterally.  Rhonchi bilaterally. CVS:  RRR. Heart sounds normal.  ABD/GI/GU: BS+. Abd soft, NTND.  G-tube in place. MSK/EXT:   Cannot move left extremities. No edema.  SKIN: no apparent skin lesion or wound NEURO: Awake and alert.  Very dysarthric with aphasia.  Left facial droop.  Left hemiparesis. PSYCH: Sleeping.  Consultants:  Hematology/oncology admitted patient Critical care Neurology Radiation oncology Palliative medicine Interventional radiology  Procedures: Intubation and mechanical ventilation. 3/26-G-tube placement  Microbiology summarized: 3/1-MRSA PCR screen nonreactive. 3/1-respiratory culture negative 3/1-a 20 pathogen RVP nonreactive 3/1-blood cultures NGTD 3/11-respiratory culture with abundant Pseudomonas aeruginosa 3/13-blood cultures NGTD   Assessment and plan: End-of-life care/full comfort care: Daughter, Karena Addison visited this morning and decided to transition to full comfort care after discussion over the phone.  She does not think he would do well at SNF.  She thinks residential hospice is to his best interest. -Transitioned to full comfort care -[facility] consulted for referral -Patient's RN notified.  Acute ischemic stroke: MRI on 3/11 showed resolving diffusion abnormality within the  posterior right MCA territory, and increased extent of hyperintense T2 weighted signal in the right hemisphere concerning for progression of lymphoma or postischemic edema.  CT angio head and neck without LVO or high-grade  stenosis.  TTE on 3/1 without significant finding.  LDL 97.  A1c 5.2% on 2/12.  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  Repeat LTM EEG on 3/20 with no seizure but evidence of epileptogenicity and cortisol dysfunction arising from right hemisphere. -Continue Keppra, Vimpat, Decadron -IV Ativan as needed seizure  High-grade B-cell lymphoma with resection and recent chemo-primarily admitted second cycle of methotrexate and leucovorin.  Oncology recommended goal of care discussion and signed off.  No clear role of radiation per radiation oncology. -AED and steroid as above  Dysphagia: Due to the above.  NPO.  Oral secretion improved with glycopyrrolate. -Was on tube feed via cortrack for almost 4 weeks -G-tube placed on 3/26.  -Continue glycopyrrolate 0.5 mg twice daily.  Recurrent aspiration pneumonia/pneumonitis: Respiratory culture with Pseudomonas. Completed IV Zosyn from 3/13-3/18.  Hypoxemia due to aspiration episode on 3/26.  Also fever, tachycardia, tachypnea and hypoxemia the night of 3/27-3/28. -Now full comfort care.  Acute respiratory failure with hypoxia: Due to aspiration pneumonia in the setting of recurrent aspiration  Paroxysmal atrial fibrillation: Rate controlled without meds.  Not on AC due to bleeding risk.   Previous PEs LLE DVT: No longer on AC.  Delirium: Has had fluctuating mental status.  Acute urinary retention: Bladder scan showed 650 cc on 3/24.  Foley inserted and had 750 cc amber clear urine right away.  Has had prostatectomy for prostate cancer. -Continue Foley catheter   Prostate cancer s/p prostatectomy   Hepatitis B  Class I obesity Body mass index is 28.41 kg/m. Nutrition Problem: Inadequate oral intake Etiology:  (seizures) Signs/Symptoms: NPO status Interventions: Tube  feeding, Prostat   DVT prophylaxis:  Patient is full comfort care.  Code Status: Full code Family Communication: Discussed with patient's daughter, Karena Addison over the phone. Level of care: Telemetry Medical Status is: Inpatient Remains inpatient appropriate because: Residential hospice placement   Final disposition: Residential hospice   55 minutes with more than 50% spent in reviewing records, counseling patient/family and coordinating care.   Sch Meds:  Scheduled Meds:  dexamethasone (DECADRON) injection  4 mg Intravenous Q12H    Continuous Infusions:  lacosamide (VIMPAT) IV     levETIRAcetam      PRN Meds:.glycopyrrolate **OR** glycopyrrolate **OR** glycopyrrolate, haloperidol **OR** haloperidol **OR** haloperidol lactate, LORazepam **OR** LORazepam **OR** LORazepam, LORazepam, morphine injection, ondansetron **OR** ondansetron (ZOFRAN) IV, polyvinyl alcohol  Antimicrobials: Anti-infectives (From admission, onward)    Start     Dose/Rate Route Frequency Ordered Stop   05/22/23 2200  Ampicillin-Sulbactam (UNASYN) 3 g in sodium chloride 0.9 % 100 mL IVPB  Status:  Discontinued        3 g 200 mL/hr over 30 Minutes Intravenous Every 6 hours 05/22/23 2106 05/23/23 0825   05/21/23 0900  ceFAZolin (ANCEF) IVPB 2g/100 mL premix        2 g 200 mL/hr over 30 Minutes Intravenous  Once 05/21/23 0804 05/21/23 0900   05/09/23 1000  tenofovir alafenamide (VEMLIDY) tablet 25 mg  Status:  Discontinued        25 mg Per Tube Daily 05/08/23 1135 05/23/23 0825   05/08/23 1245  piperacillin-tazobactam (ZOSYN) IVPB  3.375 g        3.375 g 12.5 mL/hr over 240 Minutes Intravenous Every 8 hours 05/08/23 1153 05/13/23 0955   05/08/23 1000  tenofovir (VIREAD) tablet 300 mg  Status:  Discontinued        300 mg Per Tube Daily 05/07/23 1143 05/08/23 1135   05/07/23 2300  vancomycin (VANCOCIN) IVPB 1000 mg/200 mL premix  Status:  Discontinued        1,000 mg 200 mL/hr over 60 Minutes Intravenous Every  12 hours 05/07/23 1218 05/08/23 1401   05/07/23 1015  vancomycin (VANCOREADY) IVPB 1750 mg/350 mL        1,750 mg 175 mL/hr over 120 Minutes Intravenous  Once 05/07/23 0921 05/07/23 1236   05/07/23 1015  ceFEPIme (MAXIPIME) 2 g in sodium chloride 0.9 % 100 mL IVPB  Status:  Discontinued        2 g 200 mL/hr over 30 Minutes Intravenous Every 8 hours 05/07/23 0921 05/08/23 1153   04/28/23 1000  tenofovir alafenamide (VEMLIDY) tablet 25 mg  Status:  Discontinued        25 mg Per Tube Daily 04/27/23 2309 05/07/23 1143   04/23/23 1000  tenofovir alafenamide (VEMLIDY) tablet 25 mg  Status:  Discontinued        25 mg Oral Daily 04/22/23 1520 04/27/23 2309        I have personally reviewed the following labs and images: CBC: Recent Labs  Lab 05/20/23 0535 05/21/23 0446 05/22/23 0600 05/22/23 2311  WBC 6.9 8.4 7.7 8.4  HGB 11.6* 13.5 11.5* 11.8*  HCT 34.4* 39.8 34.7* 35.6*  MCV 88.7 88.1 89.9 89.7  PLT 201 217 148* 153   BMP &GFR Recent Labs  Lab 05/20/23 0535 05/21/23 0446 05/22/23 0600 05/22/23 2158  NA 137 137 139 138  K 4.0 3.8 3.6 3.5  CL 105 104 106 107  CO2 26 22 26  20*  GLUCOSE 99 118* 130* 131*  BUN 26* 31* 21 25*  CREATININE 0.75 0.86 0.67 0.67  CALCIUM 8.3* 8.8* 8.3* 8.4*  MG 1.9 1.9 1.9  --   PHOS 4.8* 5.3* 3.5  --    Estimated Creatinine Clearance: 87 mL/min (by C-G formula based on SCr of 0.67 mg/dL). Liver & Pancreas: Recent Labs  Lab 05/20/23 0535 05/21/23 0446 05/22/23 0600  AST 25 33  --   ALT 53* 76*  --   ALKPHOS 60 78  --   BILITOT 0.6 0.7  --   PROT 5.1* 6.2*  --   ALBUMIN 2.4* 2.9* 2.2*   No results for input(s): "LIPASE", "AMYLASE" in the last 168 hours. No results for input(s): "AMMONIA" in the last 168 hours. Diabetic: No results for input(s): "HGBA1C" in the last 72 hours. Recent Labs  Lab 05/22/23 1520 05/22/23 2044 05/22/23 2342 05/23/23 0407 05/23/23 0830  GLUCAP 141* 124* 165* 185* 142*   Cardiac Enzymes: No results for  input(s): "CKTOTAL", "CKMB", "CKMBINDEX", "TROPONINI" in the last 168 hours. No results for input(s): "PROBNP" in the last 8760 hours. Coagulation Profile: Recent Labs  Lab 05/20/23 0905  INR 1.2   Thyroid Function Tests: No results for input(s): "TSH", "T4TOTAL", "FREET4", "T3FREE", "THYROIDAB" in the last 72 hours. Lipid Profile: No results for input(s): "CHOL", "HDL", "LDLCALC", "TRIG", "CHOLHDL", "LDLDIRECT" in the last 72 hours.  Anemia Panel: No results for input(s): "VITAMINB12", "FOLATE", "FERRITIN", "TIBC", "IRON", "RETICCTPCT" in the last 72 hours. Urine analysis:    Component Value Date/Time   COLORURINE YELLOW 05/08/2023 1632  APPEARANCEUR HAZY (A) 05/08/2023 1632   LABSPEC >1.046 (H) 05/08/2023 1632   PHURINE 6.0 05/08/2023 1632   GLUCOSEU NEGATIVE 05/08/2023 1632   HGBUR NEGATIVE 05/08/2023 1632   BILIRUBINUR NEGATIVE 05/08/2023 1632   KETONESUR NEGATIVE 05/08/2023 1632   PROTEINUR NEGATIVE 05/08/2023 1632   NITRITE NEGATIVE 05/08/2023 1632   LEUKOCYTESUR NEGATIVE 05/08/2023 1632   Sepsis Labs: Invalid input(s): "PROCALCITONIN", "LACTICIDVEN"  Microbiology: Recent Results (from the past 240 hours)  Culture, blood (Routine X 2) w Reflex to ID Panel     Status: None (Preliminary result)   Collection Time: 05/22/23  9:58 PM   Specimen: BLOOD RIGHT HAND  Result Value Ref Range Status   Specimen Description BLOOD RIGHT HAND  Final   Special Requests   Final    BOTTLES DRAWN AEROBIC AND ANAEROBIC Blood Culture results may not be optimal due to an inadequate volume of blood received in culture bottles   Culture   Final    NO GROWTH < 12 HOURS Performed at [facility], [address] [place], [place] [address]    Report Status PENDING  Incomplete  Culture, blood (Routine X 2) w Reflex to ID Panel     Status: None (Preliminary result)   Collection Time: 05/22/23 10:05 PM   Specimen: BLOOD  Result Value Ref Range Status   Specimen Description BLOOD  RIGHT ANTECUBITAL  Final   Special Requests   Final    BOTTLES DRAWN AEROBIC AND ANAEROBIC Blood Culture adequate volume   Culture   Final    NO GROWTH < 12 HOURS Performed at [facility], [address] [place], [place] [address]    Report Status PENDING  Incomplete  Expectorated Sputum Assessment w Gram Stain, Rflx to Resp Cult     Status: None   Collection Time: 05/22/23 11:13 PM   Specimen: Expectorated Sputum  Result Value Ref Range Status   Specimen Description EXPECTORATED SPUTUM  Final   Special Requests Normal  Final   Sputum evaluation   Final    THIS SPECIMEN IS ACCEPTABLE FOR SPUTUM CULTURE Performed at [facility], [address], [place], [place] [address]    Report Status 05/23/2023 FINAL  Final  Culture, Respiratory w Gram Stain     Status: None (Preliminary result)   Collection Time: 05/22/23 11:13 PM  Result Value Ref Range Status   Specimen Description EXPECTORATED SPUTUM  Final   Special Requests Normal Reflexed from [id number]  Final   Gram Stain   Final    FEW SQUAMOUS EPITHELIAL CELLS PRESENT FEW GRAM POSITIVE COCCI IN PAIRS MODERATE GRAM NEGATIVE RODS FEW GRAM POSITIVE RODS Performed at [facility], [address], [place], [place] [address]    Culture PENDING  Incomplete   Report Status PENDING  Incomplete     Radiology Studies: DG CHEST PORT 1 VIEW Result Date: 05/22/2023 CLINICAL DATA:  Difficulty breathing EXAM: PORTABLE CHEST 1 VIEW COMPARISON:  Film from the previous day. FINDINGS: Check shadow is stable. Feeding catheter has been removed. Right jugular chest wall port is again seen and stable. Bibasilar atelectasis is noted slightly progressed when compared with the prior exam. No bony abnormality is seen. IMPRESSION: Progressive basilar atelectasis. Electronically Signed   By: Alcide Clever M.D.   On: 05/22/2023 21:36         Pierina Schuknecht T. Westlynn Fifer [facility]  If 7PM-7AM, please contact  night-coverage [email or web address] 05/23/2023, 2:21 PM

## 2023-05-23 NOTE — Progress Notes (Signed)
   05/22/23 2057  Assess: MEWS Score  ECG Heart Rate (!) 113  Resp (!) 32  Assess: MEWS Score  MEWS Temp 1  MEWS Systolic 0  MEWS Pulse 2  MEWS RR 2  MEWS LOC 0  MEWS Score 5  MEWS Score Color Red  Assess: if the MEWS score is Yellow or Red  Were vital signs accurate and taken at a resting state? Yes  Does the patient meet 2 or more of the SIRS criteria? Yes  Does the patient have a confirmed or suspected source of infection? No  MEWS guidelines implemented  Yes, red  Treat  MEWS Interventions Considered administering scheduled or prn medications/treatments as ordered  Take Vital Signs  Increase Vital Sign Frequency  Red: Q1hr x2, continue Q4hrs until patient remains green for 12hrs  Escalate  MEWS: Escalate Red: Discuss with charge nurse and notify provider. Consider notifying RRT. If remains red for 2 hours consider need for higher level of care  Notify: Charge Nurse/RN  Name of Charge Nurse/RN Notified Louie Boston, RN  Provider Notification  Provider Name/Title Janalyn Shy, MD  Date Provider Notified 05/22/23  Time Provider Notified 2100  Method of Notification Page  Notification Reason Change in status  Provider response Other (Comment) (New orders)  Date of Provider Response 05/22/23  Time of Provider Response 2101  Assess: SIRS CRITERIA  SIRS Temperature  1  SIRS Respirations  1  SIRS Pulse 1  SIRS WBC 0  SIRS Score Sum  3

## 2023-05-23 NOTE — Plan of Care (Signed)
   Problem: Role Relationship: Goal: Family's ability to cope with current situation will improve Outcome: Progressing

## 2023-05-23 NOTE — Progress Notes (Signed)
 Nutrition Brief Note  Chart reviewed. Pt now transitioning to comfort care.  No further nutrition interventions planned at this time.  Please re-consult as needed.   Drusilla Kanner, RDN, LDN Clinical Nutrition See AMiON for contact information.

## 2023-05-23 NOTE — Plan of Care (Signed)
°  Problem: Education: Goal: Knowledge of the prescribed therapeutic regimen will improve Outcome: Not Progressing   Problem: Coping: Goal: Ability to identify and develop effective coping behavior will improve Outcome: Not Progressing   Problem: Clinical Measurements: Goal: Quality of life will improve Outcome: Not Progressing   Problem: Respiratory: Goal: Verbalizations of increased ease of respirations will increase Outcome: Not Progressing   Problem: Role Relationship: Goal: Family's ability to cope with current situation will improve Outcome: Not Progressing Goal: Ability to verbalize concerns, feelings, and thoughts to partner or family member will improve Outcome: Not Progressing

## 2023-05-23 NOTE — TOC Progression Note (Addendum)
 Transition of Care [facility]) - Progression Note    Patient Details  Name: Tyler [name] MRN: [id number] Date of Birth: 09-Nov-1950  [facility] CM/SW Contact  Baldemar Len[place], [place] Phone Number: 05/23/2023, 12:07 PM  Clinical Narrative:   CSW received call from daughter, Tyler [name], that she came to visit patient this morning, spoke with MD, and made the decision to transition to comfort care. CSW discussed hospice placement and provided choice, Tyler [name] chose [facility]. CSW attempted to reach [facility] to provide referral, left a message. CSW to follow.  UPDATE: CSW received call back from [facility], referral given for review. Awaiting response.    Expected Discharge Plan: Hospice Medical Facility Barriers to Discharge: Hospice Bed not available, Continued Medical Work up  Expected Discharge Plan and Services In-house Referral: Clinical Social Work   Post Acute Care Choice: IP Rehab Living arrangements for the past 2 months: Single Family Home                                       Social Determinants of Health (SDOH) Interventions SDOH Screenings   Food Insecurity: No Food Insecurity (04/22/2023)  Housing: Low Risk  (04/22/2023)  Transportation Needs: No Transportation Needs (04/22/2023)  Utilities: Not At Risk (04/22/2023)  Financial Resource Strain: Not on File (06/14/2021)   Received from [place], [place]  Physical Activity: Not on File (06/14/2021)   Received from [place], [place]  Social Connections: Patient Declined (04/22/2023)  Recent Concern: Social Connections - Moderately Isolated (04/07/2023)  Stress: Not on File (06/14/2021)   Received from [place], [place]  Tobacco Use: Low Risk  (05/19/2023)    Readmission Risk Interventions    04/23/2023   12:41 PM 03/07/2023    2:25 PM 08/22/2022    3:06 PM  Readmission Risk Prevention Plan  Transportation Screening Complete Complete Complete  Home Care Screening  Complete   Medication Review (RN CM)  Referral to  Pharmacy   Medication Review (RN Care Manager) Complete  Complete  PCP or Specialist appointment within 3-5 days of discharge Complete  Complete  HRI or Home Care Consult Complete  Complete  SW Recovery Care/Counseling Consult Complete  Complete  Palliative Care Screening Not Applicable  Complete  Skilled Nursing Facility Not Applicable  Complete

## 2023-05-24 DIAGNOSIS — Z515 Encounter for palliative care: Secondary | ICD-10-CM | POA: Diagnosis not present

## 2023-05-24 DIAGNOSIS — C8338 Diffuse large B-cell lymphoma, lymph nodes of multiple sites: Secondary | ICD-10-CM | POA: Diagnosis not present

## 2023-05-24 DIAGNOSIS — R221 Localized swelling, mass and lump, neck: Secondary | ICD-10-CM | POA: Diagnosis not present

## 2023-05-24 DIAGNOSIS — C8339 Primary central nervous system lymphoma: Secondary | ICD-10-CM | POA: Diagnosis not present

## 2023-05-24 DIAGNOSIS — J9611 Chronic respiratory failure with hypoxia: Secondary | ICD-10-CM

## 2023-05-24 DIAGNOSIS — I480 Paroxysmal atrial fibrillation: Secondary | ICD-10-CM

## 2023-05-24 DIAGNOSIS — T17908A Unspecified foreign body in respiratory tract, part unspecified causing other injury, initial encounter: Secondary | ICD-10-CM

## 2023-05-24 MED ORDER — HALOPERIDOL LACTATE 5 MG/ML IJ SOLN: 0.5000 mg | INTRAMUSCULAR | Status: AC | PRN

## 2023-05-24 MED ORDER — MORPHINE SULFATE (PF) 2 MG/ML IV SOLN: 1.0000 mg | INTRAVENOUS | Status: AC | PRN

## 2023-05-24 MED ORDER — LEVETIRACETAM IN NACL 1000 MG/100ML IV SOLN: 1000.0000 mg | Freq: Two times a day (BID) | INTRAVENOUS | Status: AC

## 2023-05-24 MED ORDER — LORAZEPAM 2 MG/ML IJ SOLN: 1.0000 mg | INTRAMUSCULAR | Status: AC | PRN

## 2023-05-24 MED ORDER — SODIUM CHLORIDE 0.9 % IV SOLN: 200.0000 mg | Freq: Two times a day (BID) | INTRAVENOUS | Status: AC

## 2023-05-24 MED ORDER — ONDANSETRON HCL 4 MG/2ML IJ SOLN: 4.0000 mg | Freq: Four times a day (QID) | INTRAMUSCULAR | Status: AC | PRN

## 2023-05-24 MED ORDER — DEXAMETHASONE SODIUM PHOSPHATE 4 MG/ML IJ SOLN: 4.0000 mg | Freq: Two times a day (BID) | INTRAMUSCULAR | Status: AC

## 2023-05-24 MED ORDER — GLYCOPYRROLATE 0.2 MG/ML IJ SOLN: 0.2000 mg | INTRAMUSCULAR | Status: AC | PRN

## 2023-05-24 NOTE — TOC Transition Note (Signed)
 Transition of Care [facility]) - Discharge Note   Patient Details  Name: Tyler [name] MRN: [id number] Date of Birth: August 14, 1950  Transition of Care [facility]) CM/SW Contact:  Deatra [place], [place] Phone Number: 05/24/2023, 11:25 AM   Clinical Narrative:   Pt for dc to [facility] today. Spoke to [place] at [facility] who confirmed pt's family has completed consents this morning and they are prepared to admit. RN provided with number for report and PTAR arranged for transport. SW signing off at dc.   Dellie Burns, MSW, LCSW [phone] (coverage)      Final next level of care: Hospice Medical Facility Barriers to Discharge: Barriers Resolved   Patient Goals and CMS Choice       [place] ownership interest in [facility].provided to:: Adult Children    Discharge Placement                    Patient and family notified of of transfer: 05/24/23  Discharge Plan and Services Additional resources added to the After Visit Summary for   In-house Referral: Clinical Social Work   Post Acute Care Choice: IP Rehab                               Social Drivers of Health (SDOH) Interventions SDOH Screenings   Food Insecurity: No Food Insecurity (04/22/2023)  Housing: Low Risk  (04/22/2023)  Transportation Needs: No Transportation Needs (04/22/2023)  Utilities: Not At Risk (04/22/2023)  Financial Resource Strain: Not on File (06/14/2021)   Received from [place], [place]  Physical Activity: Not on File (06/14/2021)   Received from [place], [place]  Social Connections: Patient Declined (04/22/2023)  Recent Concern: Social Connections - Moderately Isolated (04/07/2023)  Stress: Not on File (06/14/2021)   Received from [place], [place]  Tobacco Use: Low Risk  (05/19/2023)     Readmission Risk Interventions    04/23/2023   12:41 PM 03/07/2023    2:25 PM 08/22/2022    3:06 PM  Readmission Risk Prevention Plan  Transportation Screening Complete Complete Complete  Home Care  Screening  Complete   Medication Review (RN CM)  Referral to Pharmacy   Medication Review (RN Care Manager) Complete  Complete  PCP or Specialist appointment within 3-5 days of discharge Complete  Complete  HRI or Home Care Consult Complete  Complete  SW Recovery Care/Counseling Consult Complete  Complete  Palliative Care Screening Not Applicable  Complete  Skilled Nursing Facility Not Applicable  Complete

## 2023-05-24 NOTE — Progress Notes (Signed)
 Report called to [facility]. Discharge AVS placed in patients chart.

## 2023-05-24 NOTE — Progress Notes (Signed)
 [facility] [id number]  [facility] Liaison Note  Referral received from [facility] for family interest in [facility].   Spoke with patient daughter via phone to follow up from conversation with liaison meeting on yesterday regarding explanation of services and hospice philosophy. All questions answered.   [facility] is able to accept patient today once consents are complete.   RN staff, you may call report at any time to [facility] @ [phone], room is assigned when report is called.   Please leave IV intact and send completed DNR with patient.   Updated attending and [facility] manager via [employer].  Thank you for the opportunity to participate in this patient's care   Roe Rutherford, BSN, RN Hospice Nurse Liaison [phone]

## 2023-05-24 NOTE — Discharge Summary (Signed)
 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  Patient developed neck swelling, drooling, difficulty swallowing and altered mental status with concern for partial seizure.  Pulmonology consulted on 2/28.  He was intubated and admitted to ICU.  Core track placed on 3/1.  MRI brain without contrast on 3/11 showed resolving diffusion abnormality within the posterior right MCA territory and increased extent of hyperintense T2 weighted signal in right hemisphere concerning for progression of lymphoma or confluence of postischemic/postictal edema and lymphoma related vasogenic edema.  Eventually extubated on 3/2 but continued to have focal seizure.  He was reintubated and finally extubated on 3/14 again.  He was transferred to hospitalist service on 3/17.   Oncology and neurology involved but signed off.  G-tube placed by IR on 3/26.  Patient was waiting on SNF bed but family decided to transition him to full comfort care on 05/23/2023.   Patient is transferred to residential hospice at [facility] on 05/24/2023 for end-of-life care.  See individual problem list below for more.   Problems addressed during this hospitalization End-of-life care/full comfort care:  Daughter, Karena Addison visited this morning and decided to transition to full comfort care after discussion over the phone.  She does not think he would do well at SNF.  She thinks residential hospice is to his best interest. -Transitioned to full comfort care on 05/23/2023 -Transferred to residential hospice on 05/24/2023   Acute ischemic stroke: MRI on 3/11 showed resolving diffusion abnormality within the posterior right MCA territory, and increased extent of hyperintense T2 weighted signal in the right hemisphere concerning for progression of lymphoma or postischemic edema.  CT angio head and neck without LVO or high-grade stenosis.  TTE on 3/1 without significant finding.  LDL 97.  A1c 5.2% on 2/12.   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  Repeat LTM EEG on 3/20 with no seizure but evidence of epileptogenicity and cortisol dysfunction arising from right hemisphere. -Continue Keppra, Vimpat, Decadron -IV Ativan as needed seizure   High-grade B-cell lymphoma with resection and recent chemo-primarily admitted second cycle of methotrexate and leucovorin.  Oncology recommended goal of care discussion and signed off.  No clear role of radiation per radiation oncology. -AED and steroid as above   Dysphagia: Due to the above.  NPO.  Oral secretion improved with glycopyrrolate. -G-tube placed on 3/26.    Recurrent aspiration pneumonia/pneumonitis: Respiratory culture with Pseudomonas. Completed IV Zosyn from 3/13-3/18.  Hypoxemia due to aspiration episode on 3/26.  Also fever, tachycardia, tachypnea and hypoxemia the night of 3/27-3/28. -Now full comfort care.   Acute respiratory failure with hypoxia: Due to aspiration pneumonia in the setting of recurrent aspiration.  Resolved.  Now on  room air.   Paroxysmal atrial fibrillation:  Rate controlled without meds.  Not on AC due to bleeding risk.   Previous PEs LLE DVT: No longer on AC.   Delirium: Has had fluctuating mental status.   Acute urinary retention: Bladder scan showed 650 cc on 3/24.  Foley inserted and had 750 cc amber clear urine right away.  Has had prostatectomy for prostate cancer. -Continue Foley catheter   Prostate cancer s/p prostatectomy   Hepatitis B   Class I obesity Nutrition Problem: Inadequate oral intake Etiology:  (seizures) Signs/Symptoms: NPO status Interventions: Tube feeding, Prostat     Time spent  Vital signs Vitals:   05/23/23 1634 05/23/23 1704 05/23/23 2003 05/24/23 0800  BP:   (!) 197/107 (!) 163/96  Pulse:   87 66  Temp:   99.5 F (37.5 C) 97.9 F (36.6 C)  Resp:   17 19  Height:      Weight:      SpO2: 93% 92% 97% 92%  TempSrc:   Oral Axillary  BMI (Calculated):         Discharge exam GENERAL: No apparent distress.  Nontoxic. HEENT: MMM.  Vision and hearing grossly intact.  Left facial droop. NECK: Supple.  No apparent JVD.  RESP:  No IWOB.  Fair aeration bilaterally.  Rhonchi bilaterally. CVS:  RRR. Heart sounds normal.  ABD/GI/GU: BS+. Abd soft, NTND.  G-tube in place. MSK/EXT:   Cannot move left extremities. No edema.  SKIN: no apparent skin lesion or wound NEURO: Awake and alert.  Very dysarthric with aphasia.  Left facial droop.  Left hemiparesis. PSYCH: Awake.  No agitation.  Discharge Instructions  Allergies as of 05/24/2023       Reactions   Amitriptyline Other (See Comments)   Nightmares, Night sweats, Migraines   Amlodipine Swelling   BILATERAL FEET TO ANKLES   Neurontin [gabapentin] Other (See Comments)   Nightmares; Night sweats; Headache        Medication List     STOP taking these medications    cholecalciferol 25 MCG (1000 UNIT) tablet Commonly known as: VITAMIN D3   dexamethasone 4 MG tablet Commonly known as: DECADRON   docusate sodium 100 MG  capsule Commonly known as: COLACE   folic acid 1 MG tablet Commonly known as: FOLVITE   furosemide 20 MG tablet Commonly known as: LASIX   HYDROcodone-acetaminophen 5-325 MG tablet Commonly known as: NORCO/VICODIN   levETIRAcetam 500 MG tablet Commonly known as: KEPPRA   lidocaine-prilocaine cream Commonly known as: EMLA   metoprolol succinate 25 MG 24 hr tablet Commonly known as: Toprol XL   Muscle Rub 10-15 % Crea   ondansetron 8 MG tablet Commonly known as: Zofran Replaced by: ondansetron 4 MG/2ML Soln injection   pantoprazole 40 MG tablet Commonly known as: Protonix   Vemlidy 25 MG tablet Generic drug: tenofovir alafenamide   Vitamin B Complex Tabs       TAKE these medications    dexamethasone 4 MG/ML injection Commonly known as: DECADRON Inject 1 mL (4 mg total) into the vein every 12 (twelve) hours.   glycopyrrolate 0.2 MG/ML injection Commonly known as: ROBINUL Inject 1 mL (0.2 mg total) into the skin every 4 (four) hours as needed (excessive secretions).   haloperidol lactate 5 MG/ML injection Commonly known as: HALDOL Inject 0.1 mLs (0.5 mg total) into the vein every 4 (four) hours as needed (or delirium).   lacosamide 200 mg in sodium chloride 0.9 % 25 mL Inject 200  mg into the vein every 12 (twelve) hours.   levETIRAcetam 1000 MG/100ML Soln Commonly known as: KEPPRA Inject 100 mLs (1,000 mg total) into the vein every 12 (twelve) hours.   LORazepam 2 MG/ML injection Commonly known as: ATIVAN Inject 0.5 mLs (1 mg total) into the vein every 4 (four) hours as needed for anxiety or seizure.   morphine (PF) 2 MG/ML injection Inject 0.5 mLs (1 mg total) into the vein every 2 (two) hours as needed (or dyspnea).   ondansetron 4 MG/2ML Soln injection Commonly known as: ZOFRAN Inject 2 mLs (4 mg total) into the vein every 6 (six) hours as needed for nausea. Replaces: ondansetron 8 MG tablet        Consultations: Hematology/oncology admitted  patient Critical care Neurology Radiation oncology Palliative medicine Interventional radiology    Procedures/Studies: Intubation and mechanical ventilation. 3/26-G-tube placement   DG CHEST PORT 1 VIEW Result Date: 05/22/2023 CLINICAL DATA:  Difficulty breathing EXAM: PORTABLE CHEST 1 VIEW COMPARISON:  Film from the previous day. FINDINGS: Check shadow is stable. Feeding catheter has been removed. Right jugular chest wall port is again seen and stable. Bibasilar atelectasis is noted slightly progressed when compared with the prior exam. No bony abnormality is seen. IMPRESSION: Progressive basilar atelectasis. Electronically Signed   By: Alcide Clever M.D.   On: 05/22/2023 21:36   IR GASTROSTOMY TUBE MOD SED Result Date: 05/21/2023 INDICATION: Dysphagia EXAM: FLUOROSCOPIC 20 FRENCH PULL-THROUGH GASTROSTOMY Date:  05/21/2023 05/21/2023 9:08 am Radiologist:  M. Ruel Favors, MD Guidance:  Fluoroscopic MEDICATIONS: Ancef 2 g; Antibiotics were administered within 1 hour of the procedure. Glucagon 0.5 mg IV ANESTHESIA/SEDATION: Versed 0.5 mg IV; Fentanyl 25 mcg IV Moderate Sedation Time:  10 minute The patient was continuously monitored during the procedure by the interventional radiology nurse under my direct supervision. CONTRAST:  15mL OMNIPAQUE IOHEXOL 300 MG/ML SOLN - administered into the gastric lumen. FLUOROSCOPY: Fluoroscopy Time: 1 minutes 42 seconds (10 mGy). COMPLICATIONS: None immediate. PROCEDURE: Informed consent was obtained from the patient's family following explanation of the procedure, risks, benefits and alternatives. The patient understands, agrees and consents for the procedure. All questions were addressed. A time out was performed. Maximal barrier sterile technique utilized including caps, mask, sterile gowns, sterile gloves, large sterile drape, hand hygiene, and betadine prep. The left upper quadrant was sterilely prepped and draped. An oral gastric catheter was inserted into the  stomach under fluoroscopy. The existing nasogastric feeding tube was removed. Air was injected into the stomach for insufflation and visualization under fluoroscopy. The air distended stomach was confirmed beneath the anterior abdominal wall in the frontal and lateral projections. Under sterile conditions and local anesthesia, a 17 gauge trocar needle was utilized to access the stomach percutaneously beneath the left subcostal margin. Needle position was confirmed within the stomach under biplane fluoroscopy. Contrast injection confirmed position also. A single T tack was deployed for gastropexy. Over an Amplatz guide wire, a 9-French sheath was inserted into the stomach. A snare device was utilized to capture the oral gastric catheter. The snare device was pulled retrograde from the stomach up the esophagus and out the oropharynx. The 20-French pull-through gastrostomy was connected to the snare device and pulled antegrade through the oropharynx down the esophagus into the stomach and then through the percutaneous tract external to the patient. The gastrostomy was assembled externally. Contrast injection confirms position in the stomach. Images were obtained for documentation. The patient tolerated procedure well. No immediate complication. IMPRESSION: Fluoroscopic insertion of a 20-French "  pull-through" gastrostomy. Electronically Signed   By: Judie Petit.  Shick M.D.   On: 05/21/2023 09:12   DG Chest Port 1 View Result Date: 05/21/2023 CLINICAL DATA:  Shortness of breath EXAM: PORTABLE CHEST 1 VIEW COMPARISON:  Eight days prior FINDINGS: Cardiomegaly and upper mediastinal widening from aortic tortuosity and mediastinal fat by recent CT. Porta catheter on the right with tip at the upper right atrium. Feeding tube at least reaches the stomach. Low volume chest with increased hazy and streaky density at the bases. No edema, consolidation, or pneumothorax. Trace pleural fluid on the right. IMPRESSION: Low volume chest with  atelectasis that is mildly progressed. No new abnormality. Electronically Signed   By: Tiburcio Pea M.D.   On: 05/21/2023 05:52   DG Abd Portable 1V Result Date: 05/19/2023 CLINICAL DATA:  [id number] Abdominal pain [id number] EXAM: PORTABLE ABDOMEN - 1 VIEW COMPARISON:  04/26/2023 FINDINGS: Enteric tube distal tip projects over the expected location of the third portion of the duodenum. Nonobstructive bowel gas pattern. No gross free intraperitoneal air on supine imaging. IMPRESSION: Enteric tube distal tip projects over the expected location of the third portion of the duodenum. Electronically Signed   By: Duanne Guess D.O.   On: 05/19/2023 09:47   Overnight EEG with video Result Date: 05/15/2023 Charlsie Quest, MD     05/16/2023  6:23 AM Patient Name: Marquavious Nazar MRN: [id number] Epilepsy Attending: Charlsie Quest Referring Physician/Provider: Erick Blinks, MD Duration: 05/14/2023 1536 to 05/15/2023 1618  Patient history: 73yo M patient with 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 EEG to evaluate for seizure  Level of alertness: awake, asleep  AEDs during EEG study: LEV, LCM, GBP  Technical aspects: This EEG study was done with scalp electrodes positioned according to the 10-20 International system of electrode placement. Electrical activity was reviewed with band pass filter of 1-70Hz , sensitivity of 7 uV/mm, display speed of 70mm/sec with a 60Hz  notched filter applied as appropriate. EEG data were recorded continuously and digitally stored.  Video monitoring was available and reviewed as appropriate.  Description:  The posterior dominant rhythm consists of 8-9 Hz activity of moderate voltage (25-35 uV) seen predominantly in posterior head regions, asymmetric ( right<left) and reactive to eye opening and eye closing. Sleep was characterized by sleep spindles (12 to 14 Hz), maximal frontocentral region.  EEG showed continuous generalized and maximal right parietal 3 to 6 Hz theta-delta  slowing. Lateralized periodic discharges were noted in right hemisphere, maximal right parietal region with fluctuating frequency of 0.5-1Hz , predominantly when awake. Hyperventilation and photic stimulation were not performed.    ABNORMALITY -Lateralized periodic discharges, right hemisphere, maximal right parietal  region ( LPD) -Continuous slow, generalized and maximal right parietal region  IMPRESSION: This study showed evidence of epileptogenicity arising from right hemisphere, maximal right parietal region. Additionally there is cortical dysfunction in right hemisphere, maximal right parietal region likely secondary to underlying structural abnormality.  Lastly there is mild diffuse encephalopathy. No  seizures were noted.  Charlsie Quest   DG CHEST PORT 1 VIEW Result Date: 05/13/2023 CLINICAL DATA:  Pneumonia. EXAM: PORTABLE CHEST 1 VIEW COMPARISON:  Chest x-ray dated May 08, 2023. FINDINGS: Interval extubation. Unchanged feeding tube and right chest wall port catheter. Stable cardiomediastinal silhouette prominent mediastinal contours due to underlying mediastinal fat as seen on recent chest CT. Minimal linear atelectasis and/or scarring in the peripheral left lung. Density at the right lung base posterior to the for of could represent trace pleural fluid in  the right minor fissure versus atelectasis. No pneumothorax. No acute osseous abnormality. IMPRESSION: 1. Interval extubation. 2. Trace pleural fluid in the right minor fissure versus basilar atelectasis. Electronically Signed   By: Obie Dredge M.D.   On: 05/13/2023 10:06   CT ANGIO HEAD NECK W WO CM Result Date: 05/08/2023 CLINICAL DATA:  Stroke follow-up EXAM: CT ANGIOGRAPHY HEAD AND NECK WITH AND WITHOUT CONTRAST TECHNIQUE: Multidetector CT imaging of the head and neck was performed using the standard protocol during bolus administration of intravenous contrast. Multiplanar CT image reconstructions and MIPs were obtained to evaluate the  vascular anatomy. Carotid stenosis measurements (when applicable) are obtained utilizing NASCET criteria, using the distal internal carotid diameter as the denominator. RADIATION DOSE REDUCTION: This exam was performed according to the departmental dose-optimization program which includes automated exposure control, adjustment of the mA and/or kV according to patient size and/or use of iterative reconstruction technique. CONTRAST:  75mL OMNIPAQUE IOHEXOL 350 MG/ML SOLN COMPARISON:  05/06/2023 FINDINGS: CT HEAD FINDINGS Brain: Hypoattenuation throughout much of the right hemisphere, corresponding to the area of hyperintense T2-weighted signal on recent MRI. No intracranial hemorrhage. Vascular: No hyperdense vessel or unexpected vascular calcification. Skull: Remote right posterior craniotomy. Sinuses/Orbits: Bilateral maxillary and ethmoid sinus mucosal thickening. CTA NECK FINDINGS Skeleton: No acute abnormality or high grade bony spinal canal stenosis. Other neck: Normal pharynx, larynx and major salivary glands. No cervical lymphadenopathy. Unremarkable thyroid gland. Upper chest: No pneumothorax or pleural effusion. No nodules or masses. Aortic arch: There is no calcific atherosclerosis of the aortic arch. Conventional 3 vessel aortic branching pattern. RIGHT carotid system: Normal without aneurysm, dissection or stenosis. LEFT carotid system: Normal without aneurysm, dissection or stenosis. Vertebral arteries: Left dominant configuration. There is no dissection, occlusion or flow-limiting stenosis to the skull base (V1-V3 segments). CTA HEAD FINDINGS POSTERIOR CIRCULATION: Vertebral arteries are normal. No proximal occlusion of the anterior or inferior cerebellar arteries. Basilar artery is normal. Superior cerebellar arteries are normal. Posterior cerebral arteries are normal. ANTERIOR CIRCULATION: Intracranial internal carotid arteries are normal. Anterior cerebral arteries are normal. Middle cerebral  arteries are normal. Venous sinuses: As permitted by contrast timing, patent. Anatomic variants: None Review of the MIP images confirms the above findings. IMPRESSION: 1. No emergent large vessel occlusion or high-grade stenosis of the intracranial arteries. 2. Hypoattenuation throughout much of the right hemisphere, corresponding to the area of hyperintense T2-weighted signal on recent MRI. No intracranial hemorrhage. Electronically Signed   By: Deatra Robinson M.D.   On: 05/08/2023 20:07   DG Chest Port 1 View Result Date: 05/08/2023 CLINICAL DATA:  Acute respiratory failure. EXAM: PORTABLE CHEST 1 VIEW COMPARISON:  May 06, 2023. FINDINGS: Stable cardiomegaly. Endotracheal and feeding tubes are unchanged. Right internal jugular catheter is unchanged. Right lung is clear. Minimal left basilar subsegmental atelectasis is noted. IMPRESSION: Stable support apparatus. Minimal left basilar subsegmental atelectasis. Electronically Signed   By: Lupita Raider M.D.   On: 05/08/2023 09:39   VAS [place] LOWER EXTREMITY VENOUS (DVT) Result Date: 05/07/2023  Lower Venous DVT Study Patient Name:  ZACHERIE HONEYMAN  Date of Exam:   05/06/2023 Medical Rec #: [id number]     Accession #:    [id number] Date of Birth: Apr 16, 1950     Patient Gender: M Patient Age:   45 years Exam Location:  [facility] Procedure:      VAS [place] LOWER EXTREMITY VENOUS (DVT) Referring Phys: Chilton Greathouse --------------------------------------------------------------------------------  Indications: Swelling, and Edema. Other Indications: A-fib, H/O P.E. Comparison  Study: Previous study on 7.12.2021. Performing Technologist: Fernande Bras  Examination Guidelines: A complete evaluation includes B-mode imaging, spectral Doppler, color Doppler, and power Doppler as needed of all accessible portions of each vessel. Bilateral testing is considered an integral part of a complete examination. Limited examinations for reoccurring indications may be  performed as noted. The reflux portion of the exam is performed with the patient in reverse Trendelenburg.  +---------+---------------+---------+-----------+----------+--------------+ RIGHT    CompressibilityPhasicitySpontaneityPropertiesThrombus Aging +---------+---------------+---------+-----------+----------+--------------+ CFV      Full           Yes      Yes                                 +---------+---------------+---------+-----------+----------+--------------+ SFJ      Full           Yes      Yes                                 +---------+---------------+---------+-----------+----------+--------------+ FV Prox  Full                                                        +---------+---------------+---------+-----------+----------+--------------+ FV Mid   Full                                                        +---------+---------------+---------+-----------+----------+--------------+ FV DistalFull                                                        +---------+---------------+---------+-----------+----------+--------------+ PFV      Full                                                        +---------+---------------+---------+-----------+----------+--------------+ POP      Full           Yes      Yes                                 +---------+---------------+---------+-----------+----------+--------------+ PTV      Full                                                        +---------+---------------+---------+-----------+----------+--------------+ PERO     Full                                                        +---------+---------------+---------+-----------+----------+--------------+   +---------+---------------+---------+-----------+----------+--------------+  LEFT     CompressibilityPhasicitySpontaneityPropertiesThrombus Aging +---------+---------------+---------+-----------+----------+--------------+ CFV      Full            Yes      Yes                                 +---------+---------------+---------+-----------+----------+--------------+ SFJ      Full           Yes      Yes                                 +---------+---------------+---------+-----------+----------+--------------+ FV Prox  Full                                                        +---------+---------------+---------+-----------+----------+--------------+ FV Mid   Full                                                        +---------+---------------+---------+-----------+----------+--------------+ FV DistalFull                                                        +---------+---------------+---------+-----------+----------+--------------+ PFV      Full                                                        +---------+---------------+---------+-----------+----------+--------------+ POP      Full           Yes      Yes                                 +---------+---------------+---------+-----------+----------+--------------+ PTV      Full                                                        +---------+---------------+---------+-----------+----------+--------------+ PERO     Full                                                        +---------+---------------+---------+-----------+----------+--------------+     Summary: BILATERAL: - No evidence of deep vein thrombosis seen in the lower extremities, bilaterally. -No evidence of popliteal cyst, bilaterally.   *See table(s) above for measurements and observations. Electronically signed by Coral Else MD on 05/07/2023 at 8:38:36 PM.    Final    MR  BRAIN WO CONTRAST Result Date: 05/07/2023 CLINICAL DATA:  Stroke follow-up EXAM: MRI HEAD WITHOUT CONTRAST TECHNIQUE: Multiplanar, multiecho pulse sequences of the brain and surrounding structures were obtained without intravenous contrast. COMPARISON:  04/24/2023, 04/07/2023 FINDINGS: Brain:  Resolving diffusion abnormality within the posterior right MCA territory. No acute infarct. Unchanged appearance of intraparenchymal hemorrhage in the posterior right parietal lobe. Confluent hyperintense T2-weighted signal within the posterior right hemisphere now extends more anteriorly with greater involvement of the anterior right temporal lobe, right insula and frontal operculum. There is multifocal hyperintense T2-weighted signal within the white matter. Generalized volume loss. The midline structures are normal. Vascular: Normal flow voids. Skull and upper cervical spine: Normal calvarium and skull base. Visualized upper cervical spine and soft tissues are normal. Sinuses/Orbits:Moderate paranasal sinus mucosal thickening. Normal orbits. IMPRESSION: 1. Resolving diffusion abnormality within the posterior right MCA territory. 2. Increased extent of hyperintense T2-weighted signal in the right hemisphere. This could indicate progression of lymphoma, or confluence of post-ischemic/post-ictal edema cytotoxic edema and lymphoma-related vasogenic edema. Compared to 04/07/2023, however, the area of abnormal signal has decreased. 3. Unchanged appearance of intraparenchymal hemorrhage in the posterior right parietal lobe. Electronically Signed   By: Deatra Robinson M.D.   On: 05/07/2023 00:52   CT HEAD WO CONTRAST ( ) Addendum Date: 05/06/2023 ADDENDUM REPORT: 05/06/2023 14:37 ADDENDUM: Study discussed by telephone with Dr. Lindie Spruce on 05/06/2023 at 1431 hours. Electronically Signed   By: Odessa Fleming M.D.   On: 05/06/2023 14:37   Result Date: 05/06/2023 CLINICAL DATA:  73 year old male with seizure. Right side weakness. Intubated. History of treated CNS lymphoma. EXAM: CT HEAD WITHOUT CONTRAST TECHNIQUE: Contiguous axial images were obtained from the base of the skull through the vertex without intravenous contrast. RADIATION DOSE REDUCTION: This exam was performed according to the departmental dose-optimization  program which includes automated exposure control, adjustment of the mA and/or kV according to patient size and/or use of iterative reconstruction technique. COMPARISON:  Head CT 04/26/2023 and earlier. FINDINGS: Brain: Confluent cytotoxic edema now in the right MCA territory, including all of the insula, operculum, most of the posterior right MCA territory. No hemorrhagic transformation. No significant midline shift or intracranial mass effect. No ventriculomegaly. No acute intracranial hemorrhage identified. Superimposed chronic postoperative, post treatment changes to the posterior right parietal lobe with dystrophic calcification there, stable. Stable since 04/26/2023 gray-white differentiation elsewhere. Basilar cisterns are patent. Vascular: Calcified atherosclerosis at the skull base. No suspicious intracranial vascular hyperdensity. Skull: Stable appearance of right posterior parietal craniotomy. Sinuses/Orbits: Right nasoenteric tube remains in place. Increased sinus mucosal thickening and opacification from earlier this month. Tympanic cavities and mastoids remain well aerated. Other: EEG leads in place. No acute orbit or scalp soft tissue finding. IMPRESSION: 1. 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 No hemorrhagic transformation. No significant intracranial mass effect. 2. Otherwise stable post treatment appearance of the brain, right parietal tumor resection. Electronically Signed: By: Odessa Fleming M.D. On: 05/06/2023 14:16   DG Chest Port 1 View Result Date: 05/06/2023 CLINICAL DATA:  Acute respiratory failure. EXAM: PORTABLE CHEST 1 VIEW COMPARISON:  Radiograph 05/03/2023 FINDINGS: Endotracheal tube tip 5.7 cm from the carina. Weighted enteric tube in place, tip not included in the field of view. Accessed right chest port in place. Stable heart size and mediastinal  contours. No confluent airspace disease, large pleural effusion or pneumothorax. No pulmonary edema. Left second  rib fracture. IMPRESSION: 1. Endotracheal tube tip 5.7 cm from the carina. 2. Weighted enteric tube in place, tip not included in the field of view. 3. No acute findings. Electronically Signed   By: Narda Rutherford M.D.   On: 05/06/2023 11:01   DG CHEST PORT 1 VIEW Result Date: 05/03/2023 CLINICAL DATA:  73 year old male intubated. EXAM: PORTABLE CHEST 1 VIEW COMPARISON:  Portable chest yesterday. FINDINGS: Portable AP upright view at 0923 hours. Mildly rotated to the right. Endotracheal tube tip in good position between the clavicles and carina. Enteric feeding tube courses to the left abdomen, tip not included. Stable right chest power port. Stable cardiac size and mediastinal contours. Stable lung volumes and ventilation. No pneumothorax, pulmonary edema, consolidation. Negative visible bowel gas. IMPRESSION: 1. Satisfactory endotracheal tube placement. Enteric feeding tube remains in place. 2.  No convincing acute cardiopulmonary abnormality. Electronically Signed   By: Odessa Fleming M.D.   On: 05/03/2023 12:57   DG Chest Port 1 View Result Date: 05/02/2023 CLINICAL DATA:  Shortness of breath.  History of lymphoma. EXAM: PORTABLE CHEST 1 VIEW COMPARISON:  Radiograph yesterday.  Chest CT 04/26/2023 FINDINGS: Right chest port in place. Weighted enteric tube tip below the diaphragm. Lung volumes are low. Stable heart size and mediastinal contours. Bibasilar atelectasis, left greater than right. No new airspace disease. No pleural fluid or pneumothorax. No pulmonary edema. IMPRESSION: Hypoventilatory chest with bibasilar atelectasis. Electronically Signed   By: Narda Rutherford M.D.   On: 05/02/2023 21:50   DG CHEST PORT 1 VIEW Result Date: 05/01/2023 CLINICAL DATA:  Fever EXAM: PORTABLE CHEST 1 VIEW COMPARISON:  04/27/2023 FINDINGS: Cardiomegaly. Right Port-A-Cath remains in place, unchanged. No  confluent airspace opacities or effusions. No acute bony abnormality. IMPRESSION: No active disease. Electronically Signed   By: Charlett Nose M.D.   On: 05/01/2023 12:23   DG Swallowing Func-Speech Pathology Result Date: 04/28/2023 Table formatting from the original result was not included. Modified Barium Swallow Study Patient Details Name: Aiyden Lauderback MRN: [id number] Date of Birth: 1950/03/28 Today's Date: 04/28/2023 HPI/PMH: HPI: 73 yo male [facility] on [facility] with LUE and LE weakness, and change in mentation. Head CT of brain shows a recurrent tumor at the craniotomy site and increased size of recurrent tumor along with a left midline shift up to 6 mm. 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 Pt's swallowing was evaluated when he was admitted in June of 2024 - he was D/Cd on a dysphagia 3 diet with thin liquids with no f/u recommended. Pt's swallowing assessed again on 04/08/23 this admission with recommendation of a regular diet and thin liquids. Hospital course complicated by concern for neck swelling, acute dysphagia, and concern for airway protection prompting intubation from 02/28-03/02. Neck CT on 04/26/23: demonstrated "No acute finding. Assessment of mucosal surfaces limited by airway  collapse in the setting of intubation.". Clinical Impression: Pt presents with a moderate-severe oral dysphagia and a mild pharyngeal dysphagia per results of MBSS completed today. Oral deficits included poor bolus awareness, oral holding, reduced labial seal resulting in anterior spillage, poor lingual movement or initiation for oral transit, and reduced oral coordination resulting in posterior loss of trials. Oral deficits were more pronounced with nectar-thick liquid, puree, and soft solid  trials. Soft solid trial was removed from oral cavity due to  minimal-no mastication efforts.  Pt required multiple verbal and tactile cues to eventually initiate oral transit of nectar-thick and puree trials. Tactile cues included downward pressure of spoon on tongue multiple times. Pharyngeal dysphagia characterized by reduced hyolaryngeal elevation, brief mistiming of pharyngeal swallow initiation, reduced base of tongue retraction, reduced laryngeal vestibule closure, and reduced pharyngeal stripping. Findings: -There was x1 instance of trace-min silent aspiration of thin liquids (via straw) before the swallow. Aspiration occurred posteriorly from the pyriform sinuses due to pooling of liquids before swallow was initiated. Aspiration event was completely silent without cough, water eyes, increased RR, or any other subtle s/s of aspiration. -There was intermittent, trace, shallow, transient penetration of nectar-thick and thin liquids by cup. -There was shallow penetration without ejection of mixture of thin and nectar-thick residue towards end of study. Penetrated residue appeared to move slowly towards the airway, though remained above the vocal cords during assessment. Pt unable to follow directions for strong cough to attempt to clear residue. Diet recommendations outlined below. SLP will follow up for ongoing diet tolerance assessment and therapeutic PO trials of pureed solids to address oral deficits. Recommend continue tube feeds via Cortrak for nutritional support and medication delivery. Factors that may increase risk of adverse event in presence of aspiration Rubye Oaks & Clearance Coots 2021): Factors that may increase risk of adverse event in presence of aspiration Rubye Oaks & Clearance Coots 2021): Reduced cognitive function; Presence of tubes (ETT, trach, NG, etc.); Weak cough; Dependence for feeding and/or oral hygiene; Inadequate oral hygiene Recommendations/Plan: Swallowing Evaluation Recommendations Swallowing  Evaluation Recommendations Recommendations: PO diet PO Diet Recommendation: Clear liquid diet; Thin liquids (Level 0) (ok for any thin liquids) Liquid Administration via: Cup; No straw Medication Administration: Via alternative means Supervision: Staff to assist with self-feeding; Full supervision/cueing for swallowing strategies Swallowing strategies  : Minimize environmental distractions; Small bites/sips; Check for anterior loss Postural changes: Position pt fully upright for meals; Stay upright 30-60 min after meals Oral care recommendations: Oral care QID (4x/day); Staff/trained caregiver to provide oral care; Use suctioning for oral care Caregiver Recommendations: Have oral suction available Treatment Plan Treatment Plan Treatment recommendations: Therapy as outlined in treatment plan below Follow-up recommendations: Follow physicians's recommendations for discharge plan and follow up therapies (SLP services at next level of care) Functional status assessment: Patient has had a recent decline in their functional status and demonstrates the ability to make significant improvements in function in a reasonable and predictable amount of time. Treatment frequency: Min 2x/week Treatment duration: 2 weeks Interventions: Aspiration precaution training; Compensatory techniques; Patient/family education; Trials of upgraded texture/liquids; Diet toleration management by SLP; Oropharyngeal exercises Recommendations Recommendations for follow up therapy are one component of a multi-disciplinary discharge planning process, led by the attending physician.  Recommendations may be updated based on patient status, additional functional criteria and insurance authorization. Assessment: Orofacial Exam: Orofacial Exam Oral Cavity: Oral Hygiene: WFL Oral Cavity - Dentition: Adequate natural dentition Orofacial Anatomy: WFL Oral Motor/Sensory Function: Suspected cranial nerve impairment (limited assessment, though concern for oral  apraxia) Anatomy: Anatomy: WFL Boluses Administered: Boluses Administered Boluses Administered: Mildly thick liquids (Level 2, nectar thick); Thin liquids (Level 0); Puree; Solid  Oral Impairment Domain: Oral Impairment Domain Lip Closure: Escape beyond mid-chin Tongue control during bolus hold: Posterior escape of greater than half of bolus Bolus preparation/mastication: Minimal chewing/mashing with majority of bolus unchewed (removed from oral cavity) Bolus transport/lingual motion: Minimal-no tongue motion Oral residue: Majority of bolus remaining Location of oral residue : Floor of mouth; Tongue Initiation  of pharyngeal swallow : Pyriform sinuses  Pharyngeal Impairment Domain: Pharyngeal Impairment Domain Soft palate elevation: No bolus between soft palate (SP)/pharyngeal wall (PW) Laryngeal elevation: Partial superior movement of thyroid cartilage/partial approximation of arytenoids to epiglottic petiole Anterior hyoid excursion: Complete anterior movement Epiglottic movement: Complete inversion Laryngeal vestibule closure: Incomplete, narrow column air/contrast in laryngeal vestibule Pharyngeal stripping wave : Present - diminished Pharyngeal contraction (A/P view only): N/A Pharyngoesophageal segment opening: Complete distension and complete duration, no obstruction of flow Tongue base retraction: Trace column of contrast or air between tongue base and PPW Pharyngeal residue: Collection of residue within or on pharyngeal structures Location of pharyngeal residue: Tongue base; Valleculae  Esophageal Impairment Domain: No data recorded Pill: No data recorded Penetration/Aspiration Scale Score: Penetration/Aspiration Scale Score 1.  Material does not enter airway: Thin liquids (Level 0); Mildly thick liquids (Level 2, nectar thick); Puree 2.  Material enters airway, remains ABOVE vocal cords then ejected out: Thin liquids (Level 0); Mildly thick liquids (Level 2, nectar thick) 3.  Material enters airway, remains  ABOVE vocal cords and not ejected out: Thin liquids (Level 0); Mildly thick liquids (Level 2, nectar thick) 8.  Material enters airway, passes BELOW cords without attempt by patient to eject out (silent aspiration) : Thin liquids (Level 0) Compensatory Strategies: Compensatory Strategies Compensatory strategies: Yes Straw: Ineffective Ineffective Straw: Thin liquid (Level 0) Other(comment): Ineffective (tactile pressure with spoon in effort to initiate oral transit) Ineffective Other(comment): Mildly thick liquid (Level 2, nectar thick); Moderately thick liquid (Level 3, honey thick)   General Information: Caregiver present: No (follow up education completed on phone with daughter)  Diet Prior to this Study: NPO; Cortrak/Small bore NG tube   Temperature : Febrile   Respiratory Status: WFL   Supplemental O2: Nasal cannula (2L)   History of Recent Intubation: Yes  Behavior/Cognition: Alert; Cooperative; Pleasant mood Self-Feeding Abilities: Needs set-up for self-feeding Baseline vocal quality/speech: -- (intermittent vocal wetness) Volitional Cough: Unable to elicit Volitional Swallow: Unable to elicit Exam Limitations: No limitations Goal Planning: Prognosis for improved oropharyngeal function: Fair Barriers to Reach Goals: Cognitive deficits No data recorded Patient/Family Stated Goal: drink water Consulted and agree with results and recommendations: Patient; Family member/caregiver; Nurse Pain: Pain Assessment Pain Assessment: No/denies pain Breathing: 0 Negative Vocalization: 0 Facial Expression: 0 Body Language: 1 Consolability: 0 PAINAD Score: 1 Facial Expression: 0 Body Movements: 0 Muscle Tension: 0 Compliance with ventilator (intubated pts.): N/A Vocalization (extubated pts.): 0 CPOT Total: 0 End of Session: Start Time:SLP Start Time (ACUTE ONLY): 0900 Stop Time: SLP Stop Time (ACUTE ONLY): 0930 Time Calculation:SLP Time Calculation (min) (ACUTE ONLY): 30 min Charges: SLP Evaluations $ SLP Speech Visit: 1  Visit SLP Evaluations $BSS Swallow: 1 Procedure $MBS Swallow: 1 Procedure SLP visit diagnosis: SLP Visit Diagnosis: Dysphagia, oropharyngeal phase (R13.12) Past Medical History: Past Medical History: Diagnosis Date  Cancer (HCC)   prostate  Follicular lymphoma (HCC) 2024  History of pulmonary embolism   Hypertension   Myelodysplastic syndrome (HCC)   PAF (paroxysmal atrial fibrillation) (HCC)   Recurrent UTI  Past Surgical History: Past Surgical History: Procedure Laterality Date  APPLICATION OF CRANIAL NAVIGATION Right 03/10/2023  Procedure: APPLICATION OF CRANIAL NAVIGATION;  Surgeon: Tressie Stalker, MD;  Location: [facility] OR;  Service: Neurosurgery;  Laterality: Right;  BIOPSY  08/21/2022  Procedure: BIOPSY;  Surgeon: Beverley Fiedler, MD;  Location: [facility] ENDOSCOPY;  Service: Gastroenterology;;  CRANIOTOMY Right 03/10/2023  Procedure: PARIETAL CRANIOTOMY;  Surgeon: Tressie Stalker, MD;  Location: [facility] OR;  Service: Neurosurgery;  Laterality: Right;  ESOPHAGOGASTRODUODENOSCOPY (EGD) WITH PROPOFOL N/A 08/21/2022  Procedure: ESOPHAGOGASTRODUODENOSCOPY (EGD) WITH PROPOFOL;  Surgeon: Beverley Fiedler, MD;  Location: [facility] ENDOSCOPY;  Service: Gastroenterology;  Laterality: N/A;  IR IMAGING GUIDED PORT INSERTION  08/26/2022  LYMPHADENECTOMY Bilateral 09/01/2019  Procedure: LYMPHADENECTOMY;  Surgeon: Sebastian Ache, MD;  Location: [facility] ORS;  Service: Urology;  Laterality: Bilateral;  ROBOT ASSISTED LAPAROSCOPIC RADICAL PROSTATECTOMY N/A 09/01/2019  Procedure: XI ROBOTIC ASSISTED LAPAROSCOPIC RADICAL PROSTATECTOMY;  Surgeon: Sebastian Ache, MD;  Location: [facility] ORS;  Service: Urology;  Laterality: N/A;  3 HRS [name] 04/28/2023, 11:06 AM  Overnight EEG with video Result Date: 04/28/2023 Charlsie Quest, MD     05/15/2023  9:26 AM Patient Name: Angell Honse MRN: [id number] Epilepsy Attending: Charlsie Quest Referring Physician/Provider: Gordy Councilman, MD Duration: 04/27/2023 1608 to 04/28/2023 1608 Patient history: 73yo M patient  with 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 EEG to evaluate for seizure Level of alertness: Awake/lethargic AEDs during EEG study: LEV, LCM Technical aspects: This EEG study was done with scalp electrodes positioned according to the 10-20 International system of electrode placement. Electrical activity was reviewed with band pass filter of 1-70Hz , sensitivity of 7 uV/mm, display speed of 58mm/sec with a 60Hz  notched filter applied as appropriate. EEG data were recorded continuously and digitally stored.  Video monitoring was available and reviewed as appropriate. Description: No clear posterior dominant rhythm was seen.  EEG showed continuous generalized and maximal right parietal 3 to 6 Hz theta-delta slowing. Lateralized periodic discharges were noted in right hemisphere, maximal right parietal region at 1 Hz, at times with overlying rhythmicity. Seizures without clinical signs were noted arising from right parietal region, average 5 seizures per hour, lasting 1 to 2 minutes each.  During seizure EEG showed 5 to 6 Hz theta slowing admixed with sharp waves and right parietal region which then involved all of right hemisphere and evolved into 2 to 3 Hz delta slowing.  IV Vimpat was administered on 04/28/2023 at around 1042. Gradually after around 1300, EEG improved and showed lateralized periodic discharges in right hemisphere, maximal right parietal region at 1hz  with overlying rhythmicity.  Hyperventilation and photic stimulation were not performed.   ABNORMALITY -Seizure without clinical signs, right parietal region -Lateralized periodic discharges with overlying rhythmicity, right hemisphere, maximal right parietal  region ( LPD+R) -Continuous slow, generalized and maximal right parietal region IMPRESSION: This study showed seizures without clinical signs arising from right parietal region, average 5 seizures per hour lasting 1 to 2 minutes each.  IV Vimpat was administered on 04/28/2023 at around  1042.  Gradually after around 1300, EEG improved and no definite seizures were noted. Additionally there is evidence of epileptogenicity and cortical dysfunction arising from right hemisphere, maximal right parietal region likely secondary to underlying structural abnormality.  Lastly there is moderate diffuse encephalopathy. Charlsie Quest   DG CHEST PORT 1 VIEW Result Date: 04/27/2023 CLINICAL DATA:  Acute hypoxemic respiratory failure. EXAM: PORTABLE CHEST 1 VIEW COMPARISON:  Chest radiograph dated 04/26/2023. FINDINGS: The heart is enlarged. An endotracheal tube terminates in the midthoracic trachea. An enteric tube enters the stomach and terminates below the field of view. A right internal jugular central venous port catheter tip overlies the right atrium. There is mild bibasilar atelectasis/airspace disease, similar to prior exam. IMPRESSION: Mild bibasilar atelectasis/airspace disease, similar to prior exam. Electronically Signed   By: Romona Curls M.D.   On: 04/27/2023 14:57   ECHOCARDIOGRAM COMPLETE Result  Date: 04/26/2023    ECHOCARDIOGRAM REPORT   Patient Name:   LATAVION HALLS Date of Exam: 04/26/2023 Medical Rec #:  [id number]    Height:       67.0 in Accession #:    [id number]   Weight:       184.1 lb Date of Birth:  April 14, 1950    BSA:          1.952 m Patient Age:    72 years     BP:           151/88 mmHg Patient Gender: M            HR:           48 bpm. Exam Location:  Inpatient Procedure: 2D Echo, Color Doppler and Cardiac Doppler (Both Spectral and Color            Flow Doppler were utilized during procedure). Indications:    Resp Distress  History:        Patient has prior history of Echocardiogram examinations, most                 recent 10/03/2022. Chemo; Risk Factors:Hypertension.  Sonographer:    Amy Chionchio Referring Phys: [id number] MURALI RAMASWAMY IMPRESSIONS  1. Left ventricular ejection fraction, by estimation, is 60 to 65%. The left ventricle has normal function. The left ventricle has no  regional wall motion abnormalities. There is mild left ventricular hypertrophy. Left ventricular diastolic parameters are indeterminate.  2. Right ventricular systolic function is normal. The right ventricular size is normal. Tricuspid regurgitation signal is inadequate for assessing PA pressure.  3. The mitral valve is normal in structure. Trivial mitral valve regurgitation. No evidence of mitral stenosis.  4. The aortic valve is tricuspid. Aortic valve regurgitation is not visualized. No aortic stenosis is present.  5. The inferior vena cava is dilated in size with <50% respiratory variability, suggesting right atrial pressure of 15 mmHg. FINDINGS  Left Ventricle: Left ventricular ejection fraction, by estimation, is 60 to 65%. The left ventricle has normal function. The left ventricle has no regional wall motion abnormalities. Strain imaging was not performed. The left ventricular internal cavity  size was normal in size. There is mild left ventricular hypertrophy. Left ventricular diastolic parameters are indeterminate. Right Ventricle: The right ventricular size is normal. No increase in right ventricular wall thickness. Right ventricular systolic function is normal. Tricuspid regurgitation signal is inadequate for assessing PA pressure. Left Atrium: Left atrial size was normal in size. Right Atrium: Right atrial size was normal in size. Pericardium: There is no evidence of pericardial effusion. Mitral Valve: The mitral valve is normal in structure. Trivial mitral valve regurgitation. No evidence of mitral valve stenosis. MV peak gradient, 2.4 mmHg. The mean mitral valve gradient is 1.0 mmHg. Tricuspid Valve: The tricuspid valve is normal in structure. Tricuspid valve regurgitation is trivial. Aortic Valve: The aortic valve is tricuspid. Aortic valve regurgitation is not visualized. No aortic stenosis is present. Aortic valve mean gradient measures 3.0 mmHg. Aortic valve peak gradient measures 5.7 mmHg. Aortic  valve area, by VTI measures 2.11 cm. Pulmonic Valve: The pulmonic valve was grossly normal. Pulmonic valve regurgitation is not visualized. Aorta: The aortic root and ascending aorta are structurally normal, with no evidence of dilitation. Venous: The inferior vena cava is dilated in size with less than 50% respiratory variability, suggesting right atrial pressure of 15 mmHg. IAS/Shunts: The interatrial septum was not well visualized. Additional Comments: 3D imaging was not performed.  LEFT VENTRICLE PLAX 2D LVIDd:         4.20 cm     Diastology LVIDs:         2.70 cm     LV e' medial:    5.11 cm/s LV PW:         1.10 cm     LV E/e' medial:  14.0 LV IVS:        0.90 cm     LV e' lateral:   7.94 cm/s LVOT diam:     2.20 cm     LV E/e' lateral: 9.0 LV SV:         58 LV SV Index:   30 LVOT Area:     3.80 cm  LV Volumes (MOD) LV vol d, MOD A2C: 69.9 ml LV vol d, MOD A4C: 90.8 ml LV vol s, MOD A2C: 23.8 ml LV vol s, MOD A4C: 29.9 ml LV SV MOD A2C:     46.1 ml LV SV MOD A4C:     90.8 ml LV SV MOD BP:      53.6 ml RIGHT VENTRICLE             IVC RV Basal diam:  3.10 cm     IVC diam: 2.20 cm RV S prime:     14.90 cm/s TAPSE (M-mode): 1.7 cm LEFT ATRIUM             Index        RIGHT ATRIUM          Index LA Vol (A2C):   31.3 ml 16.03 ml/m  RA Area:     9.52 cm LA Vol (A4C):   54.0 ml 27.66 ml/m  RA Volume:   14.30 ml 7.32 ml/m LA Biplane Vol: 43.6 ml 22.33 ml/m  AORTIC VALVE                    PULMONIC VALVE AV Area (Vmax):    2.48 cm     PV Vmax:       0.77 m/s AV Area (Vmean):   2.33 cm     PV Peak grad:  2.4 mmHg AV Area (VTI):     2.11 cm AV Vmax:           119.00 cm/s AV Vmean:          78.400 cm/s AV VTI:            0.275 m AV Peak Grad:      5.7 mmHg AV Mean Grad:      3.0 mmHg LVOT Vmax:         77.50 cm/s LVOT Vmean:        48.100 cm/s LVOT VTI:          0.153 m LVOT/AV VTI ratio: 0.56  AORTA Ao Root diam: 3.30 cm Ao Asc diam:  3.40 cm MITRAL VALVE MV Area (PHT): 2.70 cm    SHUNTS MV Area VTI:   2.10 cm     Systemic VTI:  0.15 m MV Peak grad:  2.4 mmHg    Systemic Diam: 2.20 cm MV Mean grad:  1.0 mmHg MV Vmax:       0.77 m/s MV Vmean:      35.3 cm/s MV Decel Time: 281 msec MV E velocity: 71.70 cm/s MV A velocity: 83.10 cm/s MV E/A ratio:  0.86 Epifanio Lesches MD Electronically signed by Epifanio Lesches MD Signature Date/Time: 04/26/2023/2:18:08 PM    Final  DG CHEST PORT 1 VIEW Result Date: 04/26/2023 CLINICAL DATA:  Endotracheal tube present. EXAM: PORTABLE CHEST 1 VIEW COMPARISON:  Radiograph yesterday.  CT earlier today FINDINGS: Endotracheal tube tip 4.6 cm from the carina. Weighted enteric tube tip below the diaphragm not included in the field of view. Right chest port unchanged in position. Stable heart size and mediastinal contours. Left lung base opacity has progressed from yesterday's radiograph, subtotal lower lobe collapse on CT. Additional streaky atelectasis in the left mid lung. Right lower lobe opacity on CT has no definite radiographic correlate. No pneumothorax or large pleural effusion. IMPRESSION: 1. Endotracheal tube tip 4.6 cm from the carina. 2. Left lung base opacity has progressed from yesterday's radiograph, subtotal lower lobe collapse on this morning's CT. Additional streaky atelectasis in the left mid lung. Electronically Signed   By: Narda Rutherford M.D.   On: 04/26/2023 13:27   CT CHEST W CONTRAST Result Date: 04/26/2023 CLINICAL DATA:  Head neck cancer.  Airway compromise with intubation EXAM: CT CHEST WITH CONTRAST TECHNIQUE: Multidetector CT imaging of the chest was performed during intravenous contrast administration. RADIATION DOSE REDUCTION: This exam was performed according to the departmental dose-optimization program which includes automated exposure control, adjustment of the mA and/or kV according to patient size and/or use of iterative reconstruction technique. CONTRAST:  OMNIPAQUE IOHEXOL 300 MG/ML  SOLN COMPARISON:  Head CT 04/16/2023 FINDINGS:  Cardiovascular: Normal heart size. No pericardial effusion. Atheromatous calcification of the coronary arteries. Porta catheter with tip at the upper right atrium. Mediastinum/Nodes: Located endotracheal and enteric tube. No mass or adenopathy. Lungs/Pleura: Multi segment opacification in the lower lobes with volume loss, worse on the left. No consolidation, edema, effusion, or pneumothorax. Upper Abdomen: Enteric tube with tip at the pylorus. No acute finding Musculoskeletal: Thoracic spondylosis.  No acute finding IMPRESSION: Multi segment atelectasis in the lower lobes Electronically Signed   By: Tiburcio Pea M.D.   On: 04/26/2023 06:31   CT HEAD WO CONTRAST ( ) Result Date: 04/26/2023 CLINICAL DATA:  Mental status change.  History of CNS lymphoma. EXAM: CT HEAD WITHOUT CONTRAST TECHNIQUE: Contiguous axial images were obtained from the base of the skull through the vertex without intravenous contrast. RADIATION DOSE REDUCTION: This exam was performed according to the departmental dose-optimization program which includes automated exposure control, adjustment of the mA and/or kV according to patient size and/or use of iterative reconstruction technique. COMPARISON:  Brain MRI 04/24/2023 FINDINGS: Brain: Cortical edema at the right insula correlating with abnormality on prior brain MRI. Small area of blood products in the parasagittal right parietal lobe with adjacent swelling, operative site. No evidence of interval infarct, hydrocephalus, or collection. Vascular: No hyperdense vessel or unexpected calcification. Skull: Unremarkable right parietal craniotomy site Sinuses/Orbits: Negative IMPRESSION: No acute finding when compared to 04/24/2023 brain MRI. Electronically Signed   By: Tiburcio Pea M.D.   On: 04/26/2023 05:09   CT SOFT TISSUE NECK W CONTRAST Result Date: 04/26/2023 CLINICAL DATA:  Soft tissue infection suspected. Neck swelling and airway compromise EXAM: CT NECK WITH CONTRAST TECHNIQUE:  Multidetector CT imaging of the neck was performed using the standard protocol following the bolus administration of intravenous contrast. RADIATION DOSE REDUCTION: This exam was performed according to the departmental dose-optimization program which includes automated exposure control, adjustment of the mA and/or kV according to patient size and/or use of iterative reconstruction technique. CONTRAST:  OMNIPAQUE IOHEXOL 300 MG/ML  SOLN COMPARISON:  04/16/2023 head CT FINDINGS: Pharynx and larynx: The airway is collapsed  around endotracheal tube and enteric tube with fluid layering in the nasopharynx. This limits assessment of the mucosal surfaces, no evidence of submucosal edema. Salivary glands: No inflammation, mass, or stone. Thyroid: Normal. Lymph nodes: None enlarged or heterogeneous. Vascular: No acute finding Limited intracranial: Negative Visualized orbits: Negative Mastoids and visualized paranasal sinuses: Clear Skeleton: No acute or aggressive finding. Upper chest: Clear apical lungs. IMPRESSION: No acute finding. Assessment of mucosal surfaces limited by airway collapse in the setting of intubation. Electronically Signed   By: Tiburcio Pea M.D.   On: 04/26/2023 05:03   DG Abd 1 View Result Date: 04/26/2023 CLINICAL DATA:  Check gastric catheter placement EXAM: ABDOMEN - 1 VIEW COMPARISON:  None Available. FINDINGS: Weighted feeding catheter is noted in the distal stomach directed towards the pole or is. No free air is seen. No obstructive changes are noted. IMPRESSION: Feeding catheter as described. Electronically Signed   By: Alcide Clever M.D.   On: 04/26/2023 02:11   DG CHEST PORT 1 VIEW Result Date: 04/25/2023 CLINICAL DATA:  Check endotracheal tube placement EXAM: PORTABLE CHEST 1 VIEW COMPARISON:  Film from earlier in the same day. FINDINGS: Endotracheal tube is noted 5.3 cm above the carina. Gastric catheter extends into the stomach. Right chest wall port is again seen. Cardiac shadow  is stable. No focal infiltrate is noted. Mild basilar atelectasis is seen. IMPRESSION: Tubes and lines as described. Electronically Signed   By: Alcide Clever M.D.   On: 04/25/2023 21:44   DG Chest Port 1 View Result Date: 04/25/2023 CLINICAL DATA:  Altered mental status EXAM: PORTABLE CHEST 1 VIEW COMPARISON:  04/10/2023 FINDINGS: Cardiac shadow is enlarged but stable. Right chest wall port is again seen. Mild basilar atelectasis is seen. No bony abnormality is noted. IMPRESSION: Mild basilar atelectasis. Electronically Signed   By: Alcide Clever M.D.   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 History of B-cell lymphoma EXAM: MRI HEAD WITHOUT AND WITH CONTRAST TECHNIQUE: Multiplanar, multiecho pulse sequences of the brain and surrounding structures were obtained without and with intravenous contrast. CONTRAST:  8mL GADAVIST GADOBUTROL 1 MMOL/ML IV SOLN COMPARISON:  Comparison made with previous MRI from 04/07/2023 as well as earlier studies. FINDINGS: Brain: Postoperative changes from prior right parietal craniotomy again seen. 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 Size of the lesion is markedly decreased from prior, now measuring 2.9 x 3.5 x 2.3 cm (series 18, image 10). Superimposed susceptibility artifact consistent with blood products. Persistent but improved vasogenic edema within the adjacent posterior right cerebral hemisphere, with improved/resolved mass effect on the right lateral ventricle. Previously seen right to left shift has resolved. Findings consistent with interval response to therapy. Now seen is restricted diffusion involving the right insular cortex and overlying posterior right frontal region (series 5, images 26, 30). Associated gyral swelling with T2/FLAIR signal abnormality within this location (series 9, image 28). No associated  enhancement. 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 No other mass lesion or abnormal enhancement. No other acute or subacute infarct. No hydrocephalus or extra-axial fluid collection. Underlying cerebral white matter disease noted, stable. Vascular: Major intracranial vascular flow voids are maintained. Skull and upper cervical spine: Craniocervical junction within normal limits. Bone marrow signal intensity within normal limits. Post craniotomy changes at the right parietal calvarium without adverse features. Sinuses/Orbits: Globes and orbital soft tissues within normal limits. Mild  mucosal thickening present about the ethmoidal air cells and maxillary sinuses. No mastoid effusion. Other: None. IMPRESSION: 1. Marked interval decrease in size of right parietal mass/lymphoma, now measuring 2.9 x 3.5 x 2.3 cm (previously 5.4 x 3.6 x 4.4 cm), consistent with interval response to therapy. Persistent but improved vasogenic edema within the adjacent posterior right cerebral hemisphere, with interval resolution of previously seen right-to-left midline shift. 2. 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 Correlation with symptomatology and EEG recommended. Evolving changes of subacute ischemia would be the primary differential consideration, but are felt to be less likely. Electronically Signed   By: Rise Mu M.D.   On: 04/25/2023 05:24       The results of significant diagnostics from this hospitalization (including imaging, microbiology, ancillary and laboratory) are listed below for reference.     Microbiology: Recent Results (from the past 240 hours)  Culture, blood (Routine X 2) w Reflex to ID Panel     Status: None (Preliminary result)   Collection Time: 05/22/23  9:58 PM   Specimen: BLOOD RIGHT HAND  Result Value Ref Range Status   Specimen Description BLOOD RIGHT HAND  Final    Special Requests   Final    BOTTLES DRAWN AEROBIC AND ANAEROBIC Blood Culture results may not be optimal due to an inadequate volume of blood received in culture bottles   Culture   Final    NO GROWTH 2 DAYS Performed at [facility], [address], [place], [place] [address]    Report Status PENDING  Incomplete  Culture, blood (Routine X 2) w Reflex to ID Panel     Status: None (Preliminary result)   Collection Time: 05/22/23 10:05 PM   Specimen: BLOOD  Result Value Ref Range Status   Specimen Description BLOOD RIGHT ANTECUBITAL  Final   Special Requests   Final    BOTTLES DRAWN AEROBIC AND ANAEROBIC Blood Culture adequate volume   Culture   Final    NO GROWTH 2 DAYS Performed at [facility], [address], [place], [place] [address]    Report Status PENDING  Incomplete  Expectorated Sputum Assessment w Gram Stain, Rflx to Resp Cult     Status: None   Collection Time: 05/22/23 11:13 PM   Specimen: Expectorated Sputum  Result Value Ref Range Status   Specimen Description EXPECTORATED SPUTUM  Final   Special Requests Normal  Final   Sputum evaluation   Final    THIS SPECIMEN IS ACCEPTABLE FOR SPUTUM CULTURE Performed at [facility], [address], [place], [place] [address]    Report Status 05/23/2023 FINAL  Final  Culture, Respiratory w Gram Stain     Status: None (Preliminary result)   Collection Time: 05/22/23 11:13 PM  Result Value Ref Range Status   Specimen Description EXPECTORATED SPUTUM  Final   Special Requests Normal Reflexed from [id number]  Final   Gram Stain   Final    FEW SQUAMOUS EPITHELIAL CELLS PRESENT FEW GRAM POSITIVE COCCI IN PAIRS MODERATE GRAM NEGATIVE RODS FEW GRAM POSITIVE RODS Performed at [facility], [address] [place], [place] [address]    Culture MODERATE GRAM NEGATIVE RODS  Final   Report Status PENDING  Incomplete     Labs:  CBC: Recent Labs  Lab 05/20/23 0535 05/21/23 0446 05/22/23 0600 05/22/23 2311   WBC 6.9 8.4 7.7 8.4  HGB  11.6* 13.5 11.5* 11.8*  HCT 34.4* 39.8 34.7* 35.6*  MCV 88.7 88.1 89.9 89.7  PLT 201 217 148* 153   BMP &GFR Recent Labs  Lab 05/20/23 0535 05/21/23 0446 05/22/23 0600 05/22/23 2158  NA 137 137 139 138  K 4.0 3.8 3.6 3.5  CL 105 104 106 107  CO2 26 22 26  20*  GLUCOSE 99 118* 130* 131*  BUN 26* 31* 21 25*  CREATININE 0.75 0.86 0.67 0.67  CALCIUM 8.3* 8.8* 8.3* 8.4*  MG 1.9 1.9 1.9  --   PHOS 4.8* 5.3* 3.5  --    Estimated Creatinine Clearance: 87 mL/min (by C-G formula based on SCr of 0.67 mg/dL). Liver & Pancreas: Recent Labs  Lab 05/20/23 0535 05/21/23 0446 05/22/23 0600  AST 25 33  --   ALT 53* 76*  --   ALKPHOS 60 78  --   BILITOT 0.6 0.7  --   PROT 5.1* 6.2*  --   ALBUMIN 2.4* 2.9* 2.2*   No results for input(s): "LIPASE", "AMYLASE" in the last 168 hours. No results for input(s): "AMMONIA" in the last 168 hours. Diabetic: No results for input(s): "HGBA1C" in the last 72 hours. Recent Labs  Lab 05/22/23 1520 05/22/23 2044 05/22/23 2342 05/23/23 0407 05/23/23 0830  GLUCAP 141* 124* 165* 185* 142*   Cardiac Enzymes: No results for input(s): "CKTOTAL", "CKMB", "CKMBINDEX", "TROPONINI" in the last 168 hours. No results for input(s): "PROBNP" in the last 8760 hours. Coagulation Profile: Recent Labs  Lab 05/20/23 0905  INR 1.2   Thyroid Function Tests: No results for input(s): "TSH", "T4TOTAL", "FREET4", "T3FREE", "THYROIDAB" in the last 72 hours. Lipid Profile: No results for input(s): "CHOL", "HDL", "LDLCALC", "TRIG", "CHOLHDL", "LDLDIRECT" in the last 72 hours. Anemia Panel: No results for input(s): "VITAMINB12", "FOLATE", "FERRITIN", "TIBC", "IRON", "RETICCTPCT" in the last 72 hours. Urine analysis:    Component Value Date/Time   COLORURINE YELLOW 05/08/2023 1632   APPEARANCEUR HAZY (A) 05/08/2023 1632   LABSPEC >1.046 (H) 05/08/2023 1632   PHURINE 6.0 05/08/2023 1632   GLUCOSEU NEGATIVE 05/08/2023 1632   HGBUR  NEGATIVE 05/08/2023 1632   BILIRUBINUR NEGATIVE 05/08/2023 1632   KETONESUR NEGATIVE 05/08/2023 1632   PROTEINUR NEGATIVE 05/08/2023 1632   NITRITE NEGATIVE 05/08/2023 1632   LEUKOCYTESUR NEGATIVE 05/08/2023 1632   Sepsis Labs: Invalid input(s): "PROCALCITONIN", "LACTICIDVEN"   SIGNED:  Almon Hercules, MD  [facility] 05/24/2023, 11:01 AM

## 2023-05-25 LAB — CULTURE, RESPIRATORY W GRAM STAIN: Special Requests: NORMAL

## 2023-05-27 LAB — CULTURE, BLOOD (ROUTINE X 2)
Culture: NO GROWTH
Culture: NO GROWTH
Special Requests: ADEQUATE

## 2023-06-26 DEATH — deceased

## 2023-12-01 ENCOUNTER — Encounter (HOSPITAL_COMMUNITY): Payer: Self-pay
